# Patient Record
Sex: Male | Born: 1945 | Race: White | Hispanic: No | Marital: Married | Smoking: Former smoker
Health system: Southern US, Community
[De-identification: ages and names within clinical notes are randomized; demographics above are authoritative.]

## PROBLEM LIST (undated history)

## (undated) DIAGNOSIS — R0600 Dyspnea, unspecified: Secondary | ICD-10-CM

## (undated) DIAGNOSIS — G20 Parkinson's disease: Secondary | ICD-10-CM

## (undated) DIAGNOSIS — Z8042 Family history of malignant neoplasm of prostate: Secondary | ICD-10-CM

## (undated) DIAGNOSIS — G4733 Obstructive sleep apnea (adult) (pediatric): Secondary | ICD-10-CM

## (undated) DIAGNOSIS — F419 Anxiety disorder, unspecified: Secondary | ICD-10-CM

## (undated) DIAGNOSIS — K5792 Diverticulitis of intestine, part unspecified, without perforation or abscess without bleeding: Secondary | ICD-10-CM

## (undated) DIAGNOSIS — M5416 Radiculopathy, lumbar region: Secondary | ICD-10-CM

## (undated) DIAGNOSIS — G8929 Other chronic pain: Secondary | ICD-10-CM

## (undated) DIAGNOSIS — E785 Hyperlipidemia, unspecified: Secondary | ICD-10-CM

## (undated) DIAGNOSIS — Z801 Family history of malignant neoplasm of trachea, bronchus and lung: Secondary | ICD-10-CM

## (undated) DIAGNOSIS — Z9889 Other specified postprocedural states: Secondary | ICD-10-CM

## (undated) DIAGNOSIS — D649 Anemia, unspecified: Secondary | ICD-10-CM

## (undated) DIAGNOSIS — R112 Nausea with vomiting, unspecified: Secondary | ICD-10-CM

## (undated) DIAGNOSIS — C61 Malignant neoplasm of prostate: Secondary | ICD-10-CM

## (undated) DIAGNOSIS — M545 Low back pain, unspecified: Secondary | ICD-10-CM

## (undated) DIAGNOSIS — M1990 Unspecified osteoarthritis, unspecified site: Secondary | ICD-10-CM

## (undated) DIAGNOSIS — I10 Essential (primary) hypertension: Secondary | ICD-10-CM

## (undated) DIAGNOSIS — J189 Pneumonia, unspecified organism: Secondary | ICD-10-CM

## (undated) DIAGNOSIS — E669 Obesity, unspecified: Secondary | ICD-10-CM

## (undated) DIAGNOSIS — J069 Acute upper respiratory infection, unspecified: Secondary | ICD-10-CM

## (undated) DIAGNOSIS — Z9989 Dependence on other enabling machines and devices: Secondary | ICD-10-CM

## (undated) DIAGNOSIS — I2510 Atherosclerotic heart disease of native coronary artery without angina pectoris: Secondary | ICD-10-CM

## (undated) DIAGNOSIS — K219 Gastro-esophageal reflux disease without esophagitis: Secondary | ICD-10-CM

## (undated) DIAGNOSIS — I639 Cerebral infarction, unspecified: Secondary | ICD-10-CM

## (undated) DIAGNOSIS — I480 Paroxysmal atrial fibrillation: Secondary | ICD-10-CM

## (undated) DIAGNOSIS — Z803 Family history of malignant neoplasm of breast: Secondary | ICD-10-CM

## (undated) DIAGNOSIS — C3490 Malignant neoplasm of unspecified part of unspecified bronchus or lung: Secondary | ICD-10-CM

## (undated) DIAGNOSIS — I739 Peripheral vascular disease, unspecified: Secondary | ICD-10-CM

## (undated) DIAGNOSIS — I4891 Unspecified atrial fibrillation: Secondary | ICD-10-CM

## (undated) DIAGNOSIS — G20A1 Parkinson's disease without dyskinesia, without mention of fluctuations: Secondary | ICD-10-CM

## (undated) HISTORY — DX: Dyspnea, unspecified: R06.00

## (undated) HISTORY — DX: Gastro-esophageal reflux disease without esophagitis: K21.9

## (undated) HISTORY — DX: Family history of malignant neoplasm of breast: Z80.3

## (undated) HISTORY — PX: BACK SURGERY: SHX140

## (undated) HISTORY — DX: Malignant neoplasm of prostate: C61

## (undated) HISTORY — DX: Hyperlipidemia, unspecified: E78.5

## (undated) HISTORY — PX: ADENOIDECTOMY: SUR15

## (undated) HISTORY — DX: Family history of malignant neoplasm of trachea, bronchus and lung: Z80.1

## (undated) HISTORY — PX: JOINT REPLACEMENT: SHX530

## (undated) HISTORY — DX: Peripheral vascular disease, unspecified: I73.9

## (undated) HISTORY — PX: CORONARY ANGIOPLASTY WITH STENT PLACEMENT: SHX49

## (undated) HISTORY — DX: Family history of malignant neoplasm of prostate: Z80.42

## (undated) HISTORY — PX: TONSILLECTOMY AND ADENOIDECTOMY: SUR1326

## (undated) HISTORY — DX: Radiculopathy, lumbar region: M54.16

## (undated) HISTORY — PX: ANTERIOR CERVICAL DECOMP/DISCECTOMY FUSION: SHX1161

---

## 1996-01-03 DIAGNOSIS — J189 Pneumonia, unspecified organism: Secondary | ICD-10-CM

## 1996-01-03 HISTORY — DX: Pneumonia, unspecified organism: J18.9

## 1998-01-30 ENCOUNTER — Other Ambulatory Visit: Admission: RE | Admit: 1998-01-30 | Discharge: 1998-01-30 | Payer: Self-pay | Admitting: Family Medicine

## 2001-07-13 ENCOUNTER — Inpatient Hospital Stay (HOSPITAL_COMMUNITY): Admission: RE | Admit: 2001-07-13 | Discharge: 2001-07-15 | Payer: Self-pay | Admitting: Specialist

## 2001-07-13 ENCOUNTER — Encounter: Payer: Self-pay | Admitting: Specialist

## 2002-04-30 ENCOUNTER — Encounter: Payer: Self-pay | Admitting: Specialist

## 2002-04-30 ENCOUNTER — Encounter: Admission: RE | Admit: 2002-04-30 | Discharge: 2002-04-30 | Payer: Self-pay | Admitting: Specialist

## 2002-10-01 ENCOUNTER — Encounter: Payer: Self-pay | Admitting: Specialist

## 2002-10-05 ENCOUNTER — Inpatient Hospital Stay (HOSPITAL_COMMUNITY): Admission: RE | Admit: 2002-10-05 | Discharge: 2002-10-07 | Payer: Self-pay | Admitting: Specialist

## 2002-10-05 ENCOUNTER — Encounter: Payer: Self-pay | Admitting: Specialist

## 2003-05-24 ENCOUNTER — Inpatient Hospital Stay (HOSPITAL_COMMUNITY): Admission: EM | Admit: 2003-05-24 | Discharge: 2003-05-28 | Payer: Self-pay | Admitting: Emergency Medicine

## 2003-05-24 ENCOUNTER — Encounter: Payer: Self-pay | Admitting: Emergency Medicine

## 2003-10-05 HISTORY — PX: POSTERIOR LUMBAR FUSION: SHX6036

## 2003-10-11 ENCOUNTER — Inpatient Hospital Stay (HOSPITAL_COMMUNITY): Admission: RE | Admit: 2003-10-11 | Discharge: 2003-10-16 | Payer: Self-pay | Admitting: Specialist

## 2004-02-20 ENCOUNTER — Encounter: Admission: RE | Admit: 2004-02-20 | Discharge: 2004-02-20 | Payer: Self-pay | Admitting: Specialist

## 2004-05-19 ENCOUNTER — Encounter: Admission: RE | Admit: 2004-05-19 | Discharge: 2004-05-19 | Payer: Self-pay | Admitting: Specialist

## 2004-12-22 ENCOUNTER — Encounter: Admission: RE | Admit: 2004-12-22 | Discharge: 2004-12-22 | Payer: Self-pay | Admitting: Specialist

## 2004-12-23 ENCOUNTER — Inpatient Hospital Stay (HOSPITAL_COMMUNITY): Admission: EM | Admit: 2004-12-23 | Discharge: 2004-12-24 | Payer: Self-pay | Admitting: Emergency Medicine

## 2005-02-22 ENCOUNTER — Ambulatory Visit (HOSPITAL_BASED_OUTPATIENT_CLINIC_OR_DEPARTMENT_OTHER): Admission: RE | Admit: 2005-02-22 | Discharge: 2005-02-22 | Payer: Self-pay

## 2005-02-28 ENCOUNTER — Ambulatory Visit: Payer: Self-pay | Admitting: Internal Medicine

## 2005-03-04 HISTORY — PX: LUMBAR LAMINECTOMY/DECOMPRESSION MICRODISCECTOMY: SHX5026

## 2005-03-16 ENCOUNTER — Observation Stay (HOSPITAL_COMMUNITY): Admission: RE | Admit: 2005-03-16 | Discharge: 2005-03-17 | Payer: Self-pay | Admitting: Specialist

## 2005-03-31 ENCOUNTER — Ambulatory Visit: Payer: Self-pay | Admitting: Critical Care Medicine

## 2005-05-13 ENCOUNTER — Ambulatory Visit: Payer: Self-pay | Admitting: Critical Care Medicine

## 2005-09-03 HISTORY — PX: KNEE ARTHROSCOPY W/ PARTIAL MEDIAL MENISCECTOMY: SHX1882

## 2005-09-14 ENCOUNTER — Ambulatory Visit (HOSPITAL_COMMUNITY): Admission: RE | Admit: 2005-09-14 | Discharge: 2005-09-14 | Payer: Self-pay | Admitting: Orthopedic Surgery

## 2005-10-04 HISTORY — PX: KNEE ARTHROSCOPY: SUR90

## 2005-11-26 ENCOUNTER — Encounter: Admission: RE | Admit: 2005-11-26 | Discharge: 2005-11-26 | Payer: Self-pay | Admitting: Specialist

## 2005-12-02 ENCOUNTER — Ambulatory Visit: Payer: Self-pay | Admitting: Critical Care Medicine

## 2005-12-23 ENCOUNTER — Inpatient Hospital Stay (HOSPITAL_COMMUNITY): Admission: EM | Admit: 2005-12-23 | Discharge: 2005-12-25 | Payer: Self-pay | Admitting: Emergency Medicine

## 2006-01-13 ENCOUNTER — Encounter (HOSPITAL_COMMUNITY): Admission: RE | Admit: 2006-01-13 | Discharge: 2006-04-13 | Payer: Self-pay | Admitting: Cardiovascular Disease

## 2006-04-14 ENCOUNTER — Encounter (HOSPITAL_COMMUNITY): Admission: RE | Admit: 2006-04-14 | Discharge: 2006-06-08 | Payer: Self-pay | Admitting: Cardiovascular Disease

## 2006-09-28 ENCOUNTER — Ambulatory Visit: Payer: Self-pay | Admitting: Critical Care Medicine

## 2006-10-04 HISTORY — PX: TOTAL KNEE ARTHROPLASTY: SHX125

## 2006-10-11 ENCOUNTER — Inpatient Hospital Stay (HOSPITAL_COMMUNITY): Admission: RE | Admit: 2006-10-11 | Discharge: 2006-10-15 | Payer: Self-pay | Admitting: Orthopedic Surgery

## 2006-11-14 ENCOUNTER — Ambulatory Visit: Payer: Self-pay | Admitting: Critical Care Medicine

## 2006-12-10 ENCOUNTER — Emergency Department (HOSPITAL_COMMUNITY): Admission: EM | Admit: 2006-12-10 | Discharge: 2006-12-10 | Payer: Self-pay | Admitting: Emergency Medicine

## 2006-12-30 ENCOUNTER — Encounter: Admission: RE | Admit: 2006-12-30 | Discharge: 2006-12-30 | Payer: Self-pay | Admitting: Specialist

## 2007-09-07 ENCOUNTER — Encounter: Admission: RE | Admit: 2007-09-07 | Discharge: 2007-09-07 | Payer: Self-pay | Admitting: Specialist

## 2007-09-11 ENCOUNTER — Encounter: Admission: RE | Admit: 2007-09-11 | Discharge: 2007-09-11 | Payer: Self-pay | Admitting: Specialist

## 2008-05-07 ENCOUNTER — Encounter: Admission: RE | Admit: 2008-05-07 | Discharge: 2008-05-07 | Payer: Self-pay | Admitting: Specialist

## 2009-02-19 ENCOUNTER — Encounter: Admission: RE | Admit: 2009-02-19 | Discharge: 2009-02-19 | Payer: Self-pay | Admitting: Specialist

## 2009-12-21 ENCOUNTER — Encounter: Admission: RE | Admit: 2009-12-21 | Discharge: 2009-12-21 | Payer: Self-pay | Admitting: Specialist

## 2010-07-17 ENCOUNTER — Emergency Department (HOSPITAL_COMMUNITY): Admission: EM | Admit: 2010-07-17 | Discharge: 2010-07-17 | Payer: Self-pay | Admitting: Emergency Medicine

## 2010-08-19 ENCOUNTER — Ambulatory Visit: Payer: Self-pay | Admitting: Cardiovascular Disease

## 2010-08-28 ENCOUNTER — Ambulatory Visit: Payer: Self-pay

## 2010-08-28 ENCOUNTER — Encounter: Payer: Self-pay | Admitting: Cardiovascular Disease

## 2010-08-28 ENCOUNTER — Ambulatory Visit (HOSPITAL_COMMUNITY): Admission: RE | Admit: 2010-08-28 | Discharge: 2010-08-28 | Payer: Self-pay | Admitting: Cardiovascular Disease

## 2010-08-28 ENCOUNTER — Ambulatory Visit: Payer: Self-pay | Admitting: Cardiology

## 2010-09-20 ENCOUNTER — Encounter
Admission: RE | Admit: 2010-09-20 | Discharge: 2010-09-20 | Payer: Self-pay | Source: Home / Self Care | Attending: Specialist | Admitting: Specialist

## 2010-10-25 ENCOUNTER — Encounter: Payer: Self-pay | Admitting: Orthopedic Surgery

## 2011-02-17 ENCOUNTER — Other Ambulatory Visit: Payer: Self-pay | Admitting: *Deleted

## 2011-02-17 ENCOUNTER — Ambulatory Visit: Payer: Self-pay | Admitting: Cardiovascular Disease

## 2011-02-19 NOTE — H&P (Signed)
NAMEKORVER, GRAYBEAL                   ACCOUNT NO.:  [id number]   MEDICAL RECORD NO.:  [id number]         PATIENT TYPE:  LINP   LOCATION:                               FACILITY:  [facility]   PHYSICIAN:  Madlyn Frankel. Charlann Boxer, M.D.  DATE OF BIRTH:  September 08, 1946   DATE OF ADMISSION:  10/11/2006  DATE OF DISCHARGE:                              HISTORY & PHYSICAL   PROCEDURE:  A left total knee replacement.   CHIEF COMPLAINT:  Left knee pain.   HISTORY OF PRESENT ILLNESS:  This 65 year old male with a history of  persistent left knee pain.  This has bothered her for some time, has had  previous scheduled surgery for this left knee but has developed other  medical problem in the interim.  He developed some heart conditions  which he has been treated for successfully.  He is for his left knee.  He has tried all conservative measures including over-the-counter anti-  inflammatories, prescription anti-inflammatories, corticosteroid  injections which have failed to provide significant relief.  Due to the  fact that conservative measures have failed, his quality of life is  significantly diminished because of this pain, he has elected to proceed  forward with a left total knee replacement.   PAST MEDICAL HISTORY:  1. Coronary artery disease with history of MI in 2004 as well as      multiple stent placements and recent restenting of drug-eluting      stent to stainless-steel stent.  2. Hypertension.  3. Sleep apnea with use of CPAP machine.  4. Reflux disease.  5. Osteoarthritis.   PAST SURGICAL HISTORY:  1. Cervical fusion at 2 levels in October2002.  2. January2004, cervical fusion re-do C5, C6.  3. August2000 cardiac catheterization with stent placement.  4. January2005 lumbar fusion L5, S1.  5. January2006 left knee arthroscopic surgery.  6. June 2006, foraminal stenosis at L4-L5, L5-S1 with a decompression.  7. December7,2006 second left knee arthroscopic surgery.  8. Has had injection in his  knees at least 5 times, hip injection,      lower lumbar injections for radiculitis.  9. In 2007, restenting of coronary arteries.   SOCIAL HISTORY:  Married and his wife will be the primary caregiver  after surgery.   DRUG ALLERGIES:  CODEINE, AMPICILLIN.   MEDICATIONS:  1. Plavix 75 mg 1 p.o. daily.  2. Lisinopril 20 mg 1 p.o. daily.  3. Tricor 145 mg 1 p.o. daily.  4. Metoprolol ER 50 mg 1 p.o. daily.  5. Lipitor 40 mg 1 p.o. daily.  6. NitroQuick 0.4 p.r.n. chest pain.  7. Meloxicam 7.5 mg 1 p.o. daily.  8. Robaxin 750 mg 1 p.o. b.i.d.  9. Vicodin 5/500, 1 p.o. q. 4-6 p.r.n. pain.  10.Prilosec 20 mg 1 p.o. daily.  11.Aspirin 325 mg 1 p.o. daily.  12.Takes multivitamin as well.   REVIEW OF SYSTEMS:  In the past 2 weeks, he has had no new signs or  symptoms of any cardiovascular, respiratory, gastrointestinal,  genitourinary, neurologic, or musculoskeletal system complaints.   PHYSICAL EXAM:  VITAL SIGNS:  Temperature is 99, pulse 68, respirations  18, blood pressure 148/84.  GENERAL:  He is awake, alert, and oriented, well-developed, well-  nourished in no acute distress.  CHEST:  Lungs are clear to auscultation bilaterally.  NECK:  No carotid bruits.  HEART:  Regular rate and rhythm without gallops, clicks, rubs, or  murmur.  ABDOMEN:  Soft, nontender, nondistended.  Bowel sounds present all 4  quadrants.  GENITOURINARY:  Deferred.  EXTREMITIES:  His range of motion is approximately -5 to 100, more  medial joint line tenderness.  SKIN:  No cellulitis.  Dorsalis pedis pulse intact, positive.  NEUROLOGIC:  Intact sensibilities distally.   Labs are pending, [facility] presurgical testing.   X-rays, including chest x-ray, are pending.   IMPRESSION:  Left knee osteoarthritis.   PLAN OF ACTION:  Left total knee replaced on January8,2008, by  surgeon Dr. Durene Romans.  Risks and complications were discussed.  All  questions were encouraged, answered, and  reviewed.     ______________________________  Yetta Glassman [address], [place]      Madlyn Frankel. Charlann Boxer, M.D.  Electronically Signed   BLM/MEDQ  D:  09/19/2006  T:  09/19/2006  Job:  [id number]   cc:   Jethro Bastos, M.D.  Fax: [phone]   Vesta Mixer, M.D.  Fax: [phone]

## 2011-02-19 NOTE — Op Note (Signed)
NAME:  Corey [name], Corey [name]                             ACCOUNT NO.:  [id number]   MEDICAL RECORD NO.:  [id number]                   PATIENT TYPE:  INP   LOCATION:  5008                                 FACILITY:  [facility]   PHYSICIAN:  Kerrin Champagne, M.D.                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   PROCEDURE:  1. Central decompressive laminectomy at the L5-S1 level, with removal of the     spinous process and the posterior aspects of the neural arch of L5.  2. Decompression of bilateral L5 and S1 nerve roots.  3. Right-sided T-lift using right iliac crest bone graft, harvested through     separate fascial incision.  4. An 8 mm radiolucent lordotic Leper cage.  5. Posterior lateral fusion at L5-S1 with right iliac crest bone graft, in     addition to Symphony bone graft and local bone graft material.  6. Posterior instrumentation at L5-S1 with Monarch pedicle screws and rods.   SURGEON:  Kerrin Champagne, M.D.   ASSISTANT:  Wende Neighbors, P.A.   ANESTHESIA:  General orotracheal.   ANESTHESIOLOGIST:  Bedelia Person, M.D.   ESTIMATED BLOOD LOSS:  300 cc.   DRAINS:  1. Hemovac x1.  2. Foley catheter to straight drain.   BRIEF CLINICAL HISTORY:  The patient is a 65 year old male with history of  low back pain with radiation to the right leg, increasing neurogenic  claudication and pain with upright stance and ambulation.  This has been  worsening over the last half year.  The patient is brought to the operating  room, after preoperative studies have demonstrated grade 1 spondylolisthesis  and right L5 nerve root entrapment.   INTRAOPERATIVE FINDINGS:  1. 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  2. Foraminal entrapment of the right L5 nerve root.   DESCRIPTION OF PROCEDURE:  After adequate general anesthesia, the patient in  a prone position with chest rolls, and Jackson table used.  Standard  preoperative antibiotics, standard prep with Duraprep solution, draped in  the usual manner.  The incision, approximately 12-14 cm in length, is  extended through the skin and subcutaneous layers down to the lumbodorsal  fascia in the midline -- extending from approximately L2 to S2.  Incision is  carried on both sides of the spinous processes at L3, L4, L5, S1 and S2.  Note that intraoperative radiograph was obtained, with spinal needles at the  expected L5 and L4 levels; these were identified.  The skin was infiltrated  with Marcaine 0.5% 1:200,000 epinephrine prior to the incision.   Two Cobb's were used to elevate the paralumbar muscles off the posterior  aspect of the elements, extending from S2 to L4.  Retractor is placed.  Soft  tissues continued their dissection off the posterior elements, up to about  the L2 level -- in order to allow for exposure of the L5 and S1 pedicle  insertion sites.  Lateral gutters were exposed on the left and right side,  out to the tips of the transverse process at L5 and S1 and the sacral ala  bilaterally.   Leksell rongeur used to remove the spinous process of L5 and to thin the  posterior aspects of the lamina of L5 bilaterally.  Kerrison 3 and 4 mm were  used to resect the lamina bilaterally, as well as ligament of Flavum at the  L4-5 level and L5-S1 level.   On the left side lateral recess decompression carried out on the right L4-5  level.  The L5 nerve root is identified and traced as it exited.  A 1/4-inch  osteotome used to resect the facet on the right side at L5-S1.  Facet  capsule at L4-5 was preserved.   Entry point for the sacral screw on the right side was then  obtained at the  very base of the superior articular process of S1 on the right side.  A ball  used to perform the initial entry into the cortical bone.  A hand-held blunt-  tipped pedicle finder is used to probe the pedicle at the S1; this was  observed on C-arm fluoroscopy to be in good position and alignment.  This  was then tested using a ball-tipped probe to ensure no cortical breakout.  The depth is measured at 35 mm, tapped with a 6.25 tap.  Then a 7.0 x 35 mm  screw placed on the right side at the S1 level, capturing the pedicle at S1  and the sacrum here.  Note that prior to insertion the screw decortication  was carried out over the ala of the lateral aspects of the superior  articular process of S1.  Bone graft, that was obtained through a separate  fascial incision to the right iliac crest, was then placed out over this  area.  Similarly, a screw was then placed at the L5 level on the right side.  A ball used to make an initial entry point at the intersection of the  transverse process with the lateral aspect of the superior articular process  of L5.  Later, a high-speed bur was used to perform small opening in the  lateral aspect of the superior articular process, more medial placement of  the screw.  The screw was then placed following pedicle probing, using blunt-  tipped pedicle probe under C-arm fluoroscopy.  Measuring for depth following  the use of a ball-tipped probe, to ensure that the screw had not penetrated  laterally.  Screw redirected more medially, as there was some suggestion of  lateral penetration of the first attempt.  This then completed, a 45 mm  length screw was chosen.  A tap was then performed using a 5.5 tap and 6.25  x 45 mm screw placed.  Prior to this, transverse process and lateral portion  of the superior articular process of L5 were decorticated using a high-speed bur and bone graft applied out laterally.  Similarly, the same procedure was   performed on the left side, with introduction of pedicle  screws in the S1-L5  level.   After an induction of the screws, then testing was performed using a pedicle  monitor.  Each of the four screws measured greater than 40 milliamps of soft  tissue resistance.  With this completed, then irrigation was performed.   The L5 nerve root on the right side found to be completely free out in its  neuroforamen.  A Derrico retractor was used to retract the L5 nerve root on  the right side, as well as the thecal sac -- exposing the posterior right  side of the disk space of the L5-S1 level.  A 15 blade scalpel was used to incise the disk posteriorly.  A pituitary  used to excise disk material.  A large lamina spreader was able to be  introduced between the spinous process of S1 and the lower end of L4, in  order to provide some distraction of the disk space at the L5-S1 level.  With this, then pituitary was able to be used to debride the disk space of  disk material.  A 1/4-inch osteotome was used to excise the posterior lip of  osteophytes on the right side of the L5-S1 level.  This further allowed  entry into the disk space.  Pituitary rongeurs and curets, as well as ring  curets were then used to debride the disk space of cartilaginous endplate  material, as well as disk material.  Large pituitary was used to debride the  disk space.  Once this was completed, then an 8 mm dilator was introduced.  This appeared to fill the disk space entirely.  It could be inserted almost  35 mm.  An 8 mm cage was chosen.  This cage was filled with autogenous right  iliac crest bone graft.  It was harvested from a separate fascial incision.  The incision superficial to the lumbodorsal fascia, exposing the right iliac  crest through a subperiosteal dissection both medial and lateral to the  crest at its posterior and superior ___________.  Crest harvested using 3/4-  inch osteotome, using cortical cancellous bone  material first, then  harvesting _____________ using gouges.  Bone graft obtained from the central  laminotomy defect was used to obtain a Symphony graft material with single  large log.  Additional Symphony plasma-rich plasma was able to be obtained  at the end of the case for hemostasis purposes.   The cage was then carefully impacted with autogenous cancellous bone graft.  Additional bone graft from the right iliac crest cancellous origin was  placed into the disk space and packed into place using a curet.  The 8 mm  cage was then inserted into place using lordotic cage impacted into place.  It was observed on a C-arm fluoroscopy to be subset beneath the posterior  aspect of the disk space at L5-S1.  The curved Kicker impacter was then  inserted, used to kick the graft and the cage across the midline into a more  transverse position.  Once this was completed then careful inspection of the laminotomy site demonstrated some small bone debris that was removed using  forceps.  Each of the neural foramen for the L5 nerve roots bilaterally and  the S1 nerve roots were probed using the hockey stick neural probe and  probed freely.  Thrombin-soaked Gelfoam was then placed over the right side  of the laminotomy defect and over the nerve roots here.  A 45 mm curved rod  was then inserted into the left  pedicle fasteners at L5 and S1.  Careful  attention paid to placement of the rod; it ends extended beyond the  fasteners, however not in excess.   First the L5 fastener was tightened to the rod, using the anti-torque sleeve  and then torquing the fastener head screw to a 100 foot pounds.  Then, both  of the pedicle screws were placed under compression with the laminar  spreader removed, and the screw head for the fastener at the S1 level then  tightened to 100 foot pounds.   Similarly, this was performed then on the right side.  Using the anti-torque  sleeve at the L5 level on the right, we  torqued the screw head on the  fastener to 100 foot pounds; ensuring that there was adequate rod extending  superior to the fastener to obtain purchase.  Then, under compression, the lower fastener for the screw was tightened to  the rod -- again to 100 foot pounds.   This completed, then additional Symphony bone graft was placed over the  posterolateral elements on both sides.  Residual of the remaining Symphony  bone graft material was replaced into the right iliac crest bone graft  harvest site, following irrigation of this site.  The right iliac crest bone  graft harvest site was then closed with a running #1 stitch of #1 Vicryl;  closing the fascial layers over the iliac crest.  The subcutaneous layers  approximated with interrupted #1 Vicryl sutures.   The midline incision, first hemostased with Symphony platelet-rich plasma.  A medium Hemovac drain placed in the depth of the incision.  The paralumbar  muscles reapproximated in the midline, after inspection of the laminotomy  defect.  This demonstrated again all neural foramen to be wide open.  Gelfoam placed over the neural foramen site and then on over the laminectomy  site centrally.  The paralumbar muscles approximated loosely in the midline  with interrupted #1 Vicryl sutures.  The lumbodorsal fascia reapproximated  with interrupted figure-of-eight simple sutures of #1 Vicryl sutures --  reattaching to the supraspinous ligament where appropriate.  The deep  subcutaneous layer is approximated with  interrupted #1 and 0 Vicryl  sutures.  More superficial layers done with interrupted 2-0 Vicryl sutures.  The skin was closed with a running subcuticular stitch of 4-0 Vicryl.  Tincture of Benzoin and Steri-Strips applied; 4 x 4's ABP pad affixed to the  skin with Hypafix tape.   The patient was then returned to a supine position, reactivated and  extubated.  Returned to the recovery room in satisfactory condition.                                               Kerrin Champagne, M.D.    Myra Rude  D:  10/11/2003  T:  10/12/2003  Job:  [id number]

## 2011-02-19 NOTE — Cardiovascular Report (Signed)
NAME:  Corey [name], Corey [name]                             ACCOUNT NO.:  [id number]   MEDICAL RECORD NO.:  [id number]                   PATIENT TYPE:  INP   LOCATION:  3737                                 FACILITY:  [facility]   PHYSICIAN:  Vesta Mixer, M.D.              DATE OF BIRTH:  [birth year]   DATE OF PROCEDURE:  05/27/2003  DATE OF DISCHARGE:                              CARDIAC CATHETERIZATION   PROCEDURE:  Cardiac catheterization and PTCA and stenting.   INDICATIONS:  Corey [name] is a middle aged gentleman with a history of moderate  coronary artery disease by heart catheterization several years ago.  He was  admitted on May 24, 2003 with severe episode of chest pain.  Had some  mildly positive cardiac enzymes and is now referred for a heart  catheterization.   PROCEDURE:  The right femoral artery was easily cannulated using a modified  Seldinger technique.   HEMODYNAMICS:  The LV pressure is 128/24 with an aortic pressure of 139/79.   ANGIOGRAPHY:  The right coronary artery had a tight 99% subtotal occlusion  at its mid point.  There was moderate diffuse disease on each side of this  stenosis.  There was TIMI grade 2 flow initially.   The left main coronary artery was engaged best using an Amplatz left I.  An  Amplatz II may have given slightly better opacification if guiding catheter  is needed in the future.   The left main was found to have minor luminal irregularities.   The left anterior descending artery had minor irregularities.  There were no  significant stenoses.   The left circumflex artery had minor luminal irregularities.  There was a  moderate sized obtuse marginal artery which had only minor irregularities.   LEFT VENTRICULOGRAM:  The left ventriculogram was performed in a 30 RAO  position.  It reveals mild cardiomegaly.  The ejection fraction was well  preserved.  The ejection fraction was 55-60%.  The inferior wall was very  minimally hypokinetic.   ANGIOPLASTY:  The 6-French sheath was traded out for a 7-French sheath.  A  Judkins right 4 side hole guide was used to cannulate the right coronary  artery (7-French).  The patient was bolused with intravenous heparin.  He  was already on Integrilin.  A short Patriot guidewire was used, but was  unsuccessful in crossing the stenosis.  It resulted in some transient  thrombosis of the vessel.  At this point an [facility] wire was used  and successfully crossed down into the distal vessel.  A 2.5 x 20 mm Quantum  monorail was passed down across the stenosis and was inflated up to 12  atmospheres for 30 seconds.  This resulted in some improvement, but was  still a very hazy and tight stenosis.  At this point a 3.0 x 32 mm Taxus  stent was deployed along the entire  mid right coronary lesion.  It was  deployed at 14 atmospheres for a total of 51 seconds.  Following this, a  3.25 x 15 mm Quantum Maverick was used for post stent dilatation.  It was  inflated distally at 12 atmospheres for 20 seconds in the mid segment and 14  atmospheres for 20 seconds in the proximal segment.  It was then advanced  back down to the distal segment and inflated once more up to 14 atmospheres.  This resulted in a very nice angiographic result.  There was no evidence of  edge dissection and the stent was well opposed.   COMPLICATIONS:  None.   CONCLUSION:  Single vessel coronary artery disease.  Successful PTCA and  stenting of the mid right coronary artery.  The left anterior descending  artery and left circumflex system have minor luminal irregularities.  He is  stable leaving the catheterization laboratory.                                               Vesta Mixer, M.D.    PJN/MEDQ  D:  05/27/2003  T:  05/27/2003  Job:  [id number]

## 2011-02-19 NOTE — Discharge Summary (Signed)
NAME:  Corey [name], Corey [name]                             ACCOUNT NO.:  [id number]   MEDICAL RECORD NO.:  [id number]                   PATIENT TYPE:  INP   LOCATION:  5008                                 FACILITY:  [facility]   PHYSICIAN:  Kerrin Champagne, M.D.                DATE OF BIRTH:  August 08, 1946   DATE OF ADMISSION:  10/11/2003  DATE OF DISCHARGE:  10/16/2003                                 DISCHARGE SUMMARY   ADMISSION DIAGNOSES:  1. 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  2. Coronary artery disease status post stent placement.  Status post     myocardial infarction August of 2004.  3. History of anterior cervical discectomy and fusion C5-6 and C6-7with     subsequent takedown of hardware and repeat fusion of C5-6.  4. Hypertension.  5. Hyperlipidemia.   DISCHARGE DIAGNOSES:  1. 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  2. Coronary artery disease status post stent placement.  Status post     myocardial infarction August of 2004.  3. History of anterior cervical discectomy and fusion C5-6 and C6-7with     subsequent takedown of hardware and repeat fusion of C5-6.  4. Hypertension.  5. Hyperlipidemia.   1. Postoperative anemia not requiring blood transfusion.  2. Postoperative nausea, vomiting, and constipation resolved at discharge.   PROCEDURE:  On October 11, 2003, the patient underwent central decompressive  laminectomy at the L5-S1 level with removal of the spinous process and the  posterior aspects of the neural arch of  L5.  Decompression of bilateral L5  and S1 nerve root.  Right-sided TLIF using right iliac crest bone graft  harvested through separate fascial incision and with use of an 8 mm  radiolucent lordotic cage.  Posterolateral fusion at L5-S1 with right iliac  crest bone graft in addition to local bone graft and symphony bone graft and  posterior instrumentation at L5-S1 with monarch pedicle screws and rods.  This  was performed by Dr. Otelia Sergeant, assisted by Wende Neighbors, P.A.C, under  general anesthesia.   CONSULTATIONS:  None.   HISTORY OF PRESENT ILLNESS:  The patient is a 65 year old white male with  chronic and progressive low back pain with radiation into the right leg and  increasing neurogenic claudication and pain with upright stance and  ambulation.  Preoperative studies have demonstrated a grade I  spondylolisthesis with right L5 nerve root entrapment.  It was felt he would  require surgical intervention for definitive treatment and was admitted for  the procedure as stated above.   HOSPITAL COURSE:  The patient tolerated the procedure under general  anesthesia without complications.  Postoperatively, neurovascular and motor  function was noted to be intact in the lower extremities.  On the first  postoperative day, his Hemovac drain was discontinued and dressing was  checked daily thereafter.  His wound was found  to be healing with only a  scan amount of drainage and no erythema throughout the hospital stay.  At  discharge, his drainage had subsided and the wound was healing well.  The  patient was initially treated with PCA analgesics.  He remained on PCA  medications throughout most of the hospitalization as he did require only  clear liquids and was slow to advance his diet.  He had minimal abdominal  distention, however, he was developing nausea and was without flatus and  bowel movement for the first couple of days.  With the aid of suppository  and enemas as well as stool softeners, he eventually was able to have a  bowel movement.  At that time, his nausea and vomiting resolved.  He was  then switched to p.o. analgesics and eventually utilized hydrocodone for  pain control.  He eventually was placed on a regular diet which he  tolerated.  The patient received physical therapy consult for ambulation and  gait training.  He did require use of an Aspen LSO while out of the bed   ambulating and was allowed to don and doff the brace in the seated position  at bedside.  Prior to discharge, the patient was ambulating 180 feet with a  rolling walker.  The patient received occupational therapy for ADLs and  tolerated this well.   On the patient's fifth postoperative day, he was taking a regular diet,  having flatus and bowel movements adequately.  Neurovascular and motor  function remained intact throughout the hospital stay and he was afebrile  with vital signs stable and ready for discharge to home on the fifth  postoperative day.   PERTINENT LABORATORY DATA:  Admission CBC with wbc 7.6, rbc 4.09, hemoglobin  12.4, hematocrit 36.6.  Hemoglobin and hematocrit were monitored throughout  the hospital stay and on October 14, 2003, his last value was 9.2 and 27.0,  respectively.  Coagulation studies on admission were within normal limits.  Chemistry studies on admission were normal with the exception of glucose  102.  Repeat BMET on October 12, 2003, showed values normal with the  exception of glucose 119 and calcium 7.7.  Urinalysis on admission was  negative for urinary tract infection.   No EKG or chest x-ray is on this chart for dictation at this time.  Documentation by the anesthesia department indicates chest x-ray on October 08, 2003, showed no active disease and EKG on May 27, 2003, showed sinus  bradycardia.   CONDITION ON DISCHARGE:  Stable.   PLAN:  The patient is discharged to home.  He will continue with activity  including ambulation with his brace.  He is to wear his Aspen LSO anytime he  is out of bed.  He does not have to wear this with sleeping purposes.  Dressing changes will be done daily at home and he is given supplies and  instructions to do so.  He will be allowed to shower with removal of his  brace for that time only.   DISCHARGE MEDICATIONS: 1. Vicodin prescription, to use every four to six hours as needed for pain.  2. Robaxin 500 mg  one every eight hours as needed for spasm.  3. He will continue to use over-the-counter stool softeners daily and     laxative or enema of choice.  4. The patient will resume all of his preoperative medications and also use     Tums bilateral for calcium.   FOLLOW UP:  The  patient will follow up with Dr. Otelia Sergeant two weeks from the  date of his surgery and has been given instructions to call to arrange an  appointment.   DIET:  He will continue on a regular diet.   The patient was supplied with durable medical equipment as needed according  to the physical therapist's discretion.  The patient was advised to call if  he has questions or concerns prior to his return office visit.     Wende Neighbors, P.A.                    Kerrin Champagne, M.D.   SMV/MEDQ  D:  12/10/2003  T:  12/11/2003  Job:  [id number]

## 2011-02-19 NOTE — Assessment & Plan Note (Signed)
[facility]                             PULMONARY OFFICE NOTE   Corey [name], Corey [name]                          MRN:          [id number]  DATE:09/28/2006                            DOB:          [birth year]    This is a 65 year old white male with underlying obstructive sleep  apnea, has recurrent coronary artery disease, had a repeat stent  placement in his right coronary artery here recently.  He is needing to  have a left knee replacement.  This is scheduled for the 8 of January  with Dr. Charlann Boxer.  He complains of nasal congestion and is not tolerating  the CPAP despite lowering pressures to 9 cm water pressure earlier this  year.   The patient maintains:  1. Toprol 50 mg daily.  2. Tricor 145 mg daily.  3. Aspirin 81 mg daily.  4. Lisinopril 20 mg daily.  5. Lipitor 40 mg daily.  6. Mobic 7.50 mg daily.  7. Robaxin 2 daily.  8. Plavix 75 mg daily.  9. Prilosec 20 mg daily.   EXAMINATION:  Temperature was 98, blood pressure 140/76, pulse 85.  Saturation was 97% on room air.  CHEST:  Showed diminished breath sounds with no evidence of wheeze or  rhonchi.  CARDIAC:  Showed a regular rate and rhythm without S3.  Normal S1, S2.  ABDOMEN:  Was soft, nontender.  EXTREMITIES:  Showed no edema or clubbing.  SKIN:  Was clear.  NEUROLOGIC:  Was intact.  HEENT:  Showed no jugular venous distention, lymphadenopathy.  Oropharynx clear.  NECK:  Supple.   IMPRESSION:  Is that of obstructive sleep apnea with mild allergic  rhinitis and intolerance to CPAP mask because of nasal obstruction.   PLAN:  Is to switch to a full face mask via [facility]  and as  well begin Nasacort 2 sprays each nostril daily and saline nasal wash  via the [employer] system.  He will use this twice daily.  We will see the  patient back in return followup.     Charlcie Cradle Delford Field, MD, [facility]  Electronically Signed    PEW/MedQ  DD: 09/28/2006  DT: 09/28/2006  Job #: [id number]   cc:   Madlyn Frankel Charlann Boxer, M.D.  Vesta Mixer, M.D.

## 2011-02-19 NOTE — Op Note (Signed)
Corey [name], Corey [name]                   ACCOUNT NO.:  [id number]   MEDICAL RECORD NO.:  [id number]          PATIENT TYPE:  AMB   LOCATION:  DAY                          FACILITY:  [facility]   PHYSICIAN:  Kerrin Champagne, M.D.   DATE OF BIRTH:  10/09/1945   DATE OF PROCEDURE:  03/16/2005  DATE OF DISCHARGE:                                 OPERATIVE REPORT   PREOPERATIVE DIAGNOSIS:  Bilateral foraminal entrapment secondary to  spondylosis changes, bulging disks at the L4-5 level, affecting primarily  the L4 nerve roots.   POSTOPERATIVE DIAGNOSIS:  1.  Bilateral foraminal entrapment secondary to spondylosis changes, bulging      disks at the L4-5 level, affecting primarily the L4 nerve roots.  2.  Left L5 nerve root entrapment secondary to lateral recess stenosis,      affecting the L5 nerve root above the pedicle.   PROCEDURE:  Central laminectomy at the L4-5 level with bilateral L4-5  foraminotomies, bilateral L5 nerve root decompression.   SURGEON:  Kerrin Champagne, M.D.   ASSISTANT:  Wende Neighbors, PA-C.   ANESTHESIA:  General via orotracheal intubation, Dr. Rica Mast.  Local  infiltration was not performed.   SPECIMENS:  None.   ESTIMATED BLOOD LOSS:  100 cc.   COMPLICATIONS:  None.  Patient returned to the PACU in good condition.   HISTORY OF PRESENT ILLNESS:  The patient is a 65 year old male who underwent  previous L5-S1 decompression and fusion with 360 degree technique using TLIF  and posterolateral fusion with Monarch screws and rods.  The patient has  done well up until about six months ago and began experiencing pain with  standing and ambulation.  Radiation primarily at an L4 distribution on both  lower extremities, left side greater than right.  He underwent a knee  arthroscopy for a torn meniscus.  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  Underwent  myelogram, and post myelogram CT demonstrated a solid fusion at L5-S1 level  with the L5 neuroforamen narrowing; however, the nerve roots had not shown  definite compression.  The bilateral L4 nerve root compression secondary to  spondylosis changes and bulging disk at the L4-5 level, affecting both nerve  roots.  Patient is brought to the operating room to undergo bilateral  foraminotomy at the L4 level and decompression of the lateral recesses at  this segment above his previous fusion site.   DESCRIPTION OF PROCEDURE:  After adequate general anesthesia, the patient  was transferred to the [facility] fracture table.  Bilateral chest rolls were  used.  All pressure points were well-padded.  Both lower extremities were  brought into full extension.  The patient had standard prep with DuraPrep  solution.  He underwent preoperative antibiotics with vancomycin, as he has  allergies to penicillin and ampicillin.  Initial spinal needles were placed  following prepping and draping at the expected L4-5 level.  Intraoperative  lateral radiograph demonstrated the needles at  the L5-S1 level with the  upper needle just at the upper level of the patient's rods that would be at  the L5 pedicle level.  The incision was then made from the upper spinal  needle, extending superiorly over about 4 to 4.5 cm through the skin and  ellipsing the old incision scar.  The incision carried sharply down to the  dorsal lumbar fascia.  Bleeders were controlled using electrocautery.  Cerebellar retractor inserted.  The incision was carried down to the spinous  processes on both sides of the expected remnant of the spinous process at  the L4 level and at the L5 level.  Cobbs were used to elevate the paralumbar  muscles of both sides, exposing the posterior aspect of the elements of the  L4 level.  Clamp placed over the L5 spinous process.  Intraoperative lateral  radiograph demonstrating this to  be the L4 spinous process.  Exposure then  obtained over the posterior aspect of the L3-4 spinous process.  A  McCullough retractor is placed.  Leksell rongeur was used to resect the  remaining portion of the superior aspect of the spinous process of L4, then  thinned the central portions of the remaining lamina of the mid and upper  portion of the L4.  A 3 mm Kerrison was then used to resect the central  portions of the lamina using loupe magnification and a head lamp up to the  L3-4 interspace.  The operating room microscope was draped and brought into  the field under direct magnification.  The scope was sterilely brought into  the field.  The laminotomy region was examined.  Bilateral lateral recess  decompression was carried out using the 3-4 mm Kerrisons.  The area of the  pars examined, and at least 4-6 mm of the pars were left in good condition  on both sides to prevent stress reaction after surgery.  Bilateral  foraminotomy was carried out by resection of the medial aspect of the  ligamentum flavum off of the L4-5 facet bilaterally.  This reflected portion  of bone was quite hypertrophic on the left side impression of the thecal  sac, and the left L5 nerve root identified as well as on the right side some  residual flavum appeared to be impressing upon the thecal sac as well, and  this was decompressed.  Both L5 roots were decompressed well below the  pedicle until they were felt to be exiting the neuroforamen without  compression here.  The superior aspect of the superior articular process of  L5 was resected bilaterally in order to decompress the L4-5 neural foramen  on both sides such that the hockey stick neural probe could be passed out  the neural foramen above and below the L4 nerve root, demonstrating patency  of the foramen and no further nerve compression.  Irrigation was performed.  Thrombin-soaked Gelfoam placed in both lateral recesses in both areas until hemostasis  was then obtained.  This Gelfoam was then removed.  Bone wax  applied to the bleeding cancellous bone surfaces, and excess bone wax  removed.  A small portion of Gelfoam was then placed over the posterior  laminotomy region and irrigation performed.  The incision was then closed by  approximating the paralumbar muscles, interrupted loose sutures of #1  Vicryl.  Lumbodorsal fascia was approximated in the midline with interrupted  #1 Vicryl sutures in simple __________ the subcu layers were approximated  with interrupted #1 2-0 Vicryl sutures.  The skin  was closed with a running  subcu stitch with 4-0 Vicryl.  Tincture of Benzoin and Steri-Strips were  applied, 4x4s and ABD pads were fixed to the skin with Hypafix tape.  The  patient then returned to the supine position, reactivated and extubated.  Returned to the recovery room in satisfactory condition.  All instruments  and sponge counts were correct.       JEN/MEDQ  D:  03/16/2005  T:  03/16/2005  Job:  [id number]

## 2011-02-19 NOTE — H&P (Signed)
NAME:  Corey [name], Corey [name]                             ACCOUNT NO.:  [id number]   MEDICAL RECORD NO.:  [id number]                   PATIENT TYPE:  EMS   LOCATION:  MINO                                 FACILITY:  [facility]   PHYSICIAN:  Armanda Magic, M.D.                  DATE OF BIRTH:  01/19/1946   DATE OF ADMISSION:  05/24/2003  DATE OF DISCHARGE:                                HISTORY & PHYSICAL   CHIEF COMPLAINT:  Chest pain.   HISTORY OF PRESENT ILLNESS:  This is a 65 year old male patient of Dr.  Harvie Bridge, with a history of hypertension and hyperlipidemia with an  admission in 1998 for chest pain.  Catheterization at that time showed mild  to moderate coronary disease, and normal LV function.  He was treated  medically at that time, and is now admitted with chest pain.  Yesterday, he  was mowing his yard and developed cramping sensation in the center of his  chest.  The pain resolved with rest.  This morning, he took his a.m. blood  pressure medicine, Avapro, and at approximately 12 noon had chest cramping  which resolved with rest.  The pain then became intermittent throughout the  day.  Currently he complains of a 5/10 chest pain, although he appears to be  resting quite well.  He denies any shortness of breath.  He did have some  nausea during the episode of chest pain and diaphoresis.   PAST MEDICAL HISTORY:  Significant for hypertension, hyperlipidemia,  moderate coronary disease by catheterization in 1998, spinal stenosis, and  diverticulosis.   PAST SURGICAL HISTORY:  Foot surgery and tonsillectomy.   ALLERGIES:  1. AMPICILLIN.  2. MONOPRIL.  3. CODEINE.   FAMILY HISTORY:  CVA, coronary disease, and hypertension.   SOCIAL HISTORY:  Positive for tobacco in the past.  He is married.   CURRENT MEDICATIONS:  1. Avapro 150 mg daily, one-half tablet.  2. Bextra 20 mg daily.  3. Tizanidine 2 mg p.r.n. q.h.s.  4. Aspirin 81 mg daily.   REVIEW OF SYSTEMS:  Other than what is  stated in the HPI is negative.   PHYSICAL EXAMINATION:  VITAL SIGNS:  Blood pressure is 137/72, heart rate  62.  GENERAL:  This is a well-developed, well-nourished, white male in no acute  distress.  HEENT:  Benign.  NECK:  Supple without lymphadenopathy.  No bruits.  LUNGS:  Clear to auscultation throughout.  HEART:  Regular rate and rhythm.  No murmurs, rubs, or gallops.  Normal S1  and S2.  ABDOMEN:  Soft, nontender, nondistended, with active bowel sounds.  No  hepatosplenomegaly.  EXTREMITIES:  No cyanosis, erythema, or edema.  Good distal pulses.   LABORATORY DATA:  White cell count 7.8, hematocrit 14.2, hemoglobin 42,  platelet count 251.  Sodium 140, potassium 4.5, chloride 105, bicarb 28, BUN  18, creatinine 1.3.  Troponin  less than 0.05.  CPK-MB 3, myoglobin 137.  EKG  shows normal sinus rhythm with no ST-T wave changes.   ASSESSMENT:  1. Chest pain with initial enzymes negative, and nonischemic EKG.     Currently, pain is 5/10.  2. History of moderate coronary artery disease.  3. Hypertension.  4. Hyperlipidemia.   PLAN:  Admit to chest pain unit.  Rule out myocardial infarction with serial  enzymes.  IV heparin drip, IV nitroglycerin drip, Lopressor 12.5 mg b.i.d.  Check a TSH and fasting lipid panel.  Further work-up per Dr. Elease Hashimoto.                                                Armanda Magic, M.D.    TT/MEDQ  D:  05/24/2003  T:  05/25/2003  Job:  [id number]   cc:   Vesta Mixer, M.D.  [address]  [place]  [place] [address]  Fax: [phone]

## 2011-02-19 NOTE — H&P (Signed)
[name], [name]                   ACCOUNT NO.:  [id number]   MEDICAL RECORD NO.:  [id number]          PATIENT TYPE:  INP   LOCATION:  3701                         FACILITY:  [facility]   PHYSICIAN:  Vesta Mixer, M.D. DATE OF BIRTH:  12-21-1945   DATE OF ADMISSION:  12/23/2004  DATE OF DISCHARGE:                                HISTORY & PHYSICAL   HISTORY OF PRESENT ILLNESS:  Corey [name] is a middle-aged gentleman with a  history of coronary artery disease.  He is admitted to the hospital with  episodes of chest pain.   Corey [name] has a history of coronary artery disease.  He is status post PTCA  several years ago.  He is status post PTCA and stenting to his right  coronary artery.  He has done fairly well and has not had any episodes of  chest pain.  He was seen in the office a week or so ago and did not have any  episodes of angina.   Over the past day or so, he has had episodes of exertional angina.  The pain  worsened when he was up and around and was relieved with rest.  Today he had  further episodes of chest pain relieved with two sublingual nitroglycerin.  He called EMS and was brought into the emergency room.  In the ER, his pain  was relieved with more nitroglycerin and eventually with heparin drip.   CURRENT MEDICATIONS:  1.  Aspirin 325 mg a day.  2.  Plavix 75 mg a day.  3.  Toprol XL 25 mg a day.  4.  Tricor 145 mg a day.  5.  Diovan 80 mg a day.   ALLERGIES:  CODEINE, AMPICILLIN, and intolerant to MONOPRIL apparently.   PAST MEDICAL HISTORY:  1.  Hypertension.  2.  Hyperlipidemia.  3.  Coronary artery disease, status post PTCA and stenting of his right      coronary artery in August of 2004.  4.  History of spinal stenosis.   SOCIAL HISTORY:  The patient has a history of smoking in the past.  He is  married.   REVIEW OF SYSTEMS:  Reviewed and is essentially negative.   PHYSICAL EXAMINATION:  GENERAL:  He is a middle-age gentleman in no acute  distress.  He  is alert and oriented x3.  His mood and affect are normal.  VITAL SIGNS:  Temperature 97.7, heart rate 68, blood pressure 139/84.  HEENT:  2+ carotids.  He has no bruits, no JVD, and no thyromegaly.  LUNGS:  Clear to auscultation.  HEART:  Regular rate, S1 and S2.  He has no murmurs, gallops, or rubs.  ABDOMEN:  Good bowel sounds and was nontender.  EXTREMITIES:  No cyanosis, clubbing, or edema.  NEUROLOGY:  Nonfocal.   His EKG reveals normal sinus rhythm.  He has no ST or T wave changes.   Corey [name] presents with some episodes of chest pain.  He has no EKG changes and  his initial cardiac markers are negative.  His pain is fairly typical and I  think that he needs a heart catheterization.  We have discussed the risks,  benefits, and options of heart catheterization.  He understands and agrees  to proceed.      PJN/MEDQ  D:  12/23/2004  T:  12/24/2004  Job:  [id number]

## 2011-02-19 NOTE — Discharge Summary (Signed)
   NAME:  Corey [name], Corey [name]                             ACCOUNT NO.:  [id number]   MEDICAL RECORD NO.:  [id number]                   PATIENT TYPE:  INP   LOCATION:  6522                                 FACILITY:  [facility]   PHYSICIAN:  Vesta Mixer, M.D.              DATE OF BIRTH:  19-Aug-1946   DATE OF ADMISSION:  05/24/2003  DATE OF DISCHARGE:  05/28/2003                                 DISCHARGE SUMMARY   DISCHARGE DIAGNOSES:  1. Inferior wall subendocardial myocardial infarction.  2. Coronary artery disease - status post percutaneous transluminal coronary     angioplasty and stenting of the right coronary artery.  3. Hypertension.  4. History of cigarette smoking.   DISCHARGE MEDICATIONS:  1. Aspirin 325 mg daily.  2. Plavix 75 mg daily.  3. Toprol XL 25 mg daily.  4. Avapro 75 mg daily.  5. Nitroglycerin p.r.n.   DISPOSITION:  The patient has been instructed to stop smoking.  He is to  start on a walking program.  He will see Dr. Elease Hashimoto in one to two weeks.   HISTORY:  Corey [name] is a 65 year old gentleman with a history of moderate  coronary artery disease in the past.  He is admitted to the hospital on  May 24, 2003 with symptoms of chest pain and mildly positive cardiac  enzymes.   HOSPITAL COURSE BY PROBLEMS:  PROBLEM 1.  Coronary artery disease.  The  patient had mild elevations of his cardiac enzymes. He was placed on heparin  and Integrelin and stabilized quite nicely.  He had a PTCA and stenting of a  very tight coronary artery stenosis.  The stenosis was subtotally occluded  at its mid point.  We placed a 3.0 x 32 mm Taxus stent post dilated, using a  3.25 mm Quantum monorail balloon.  The patient tolerated the procedure quite  well.  He will be discharged on the above noted medications and disposition.  We will see him in the office and plan on checking his cholesterol levels at  that time.                                                Vesta Mixer,  M.D.    PJN/MEDQ  D:  05/28/2003  T:  05/29/2003  Job:  [id number]

## 2011-02-19 NOTE — H&P (Signed)
[facility]  Patient:    Corey [name], Corey [name] Visit Number: [id number] MRN: [id number]          Service Type: Attending:  Kerrin Champagne, M.D. Dictated by:   Marcie Bal Velna Ochs, P.A.-C.   CC:         Kerrin Champagne, M.D.  Jethro Bastos, M.D. at [facility]   History and Physical  DATE OF BIRTH:  Nov 28, 1945.  SOCIAL SECURITY NUMBER:  [id number]  SCHEDULED ADMISSION DATE:  July 13, 2001.  CHIEF COMPLAINT:  Neck pain, left arm pain, and weakness in grip strength.  HISTORY OF PRESENT ILLNESS:  Corey [name] is a 65 year old, left hand dominant male who presents with a six-month history of neck pain, left arm pain, and weakness in his grip strength. He does not recall any specific injury or trigger for this. This has been progressive in nature. He is noted now to have weakness in that left arm as well as weakness in his grip strength. He has also developed numbness along his left lateral forearm and all the digits of that left hand. He has undergone evaluation and MRI study which demonstrated a significant cervical findings. Because of his lack of improvement with conservative measures, he elected to undergo surgical intervention.  REVIEW OF SYSTEMS:  Denies any diplopia or blurred vision or headaches. No recent fever or chills. No rhinorrhea, sore throat, or earache. No chest pain, shortness of breath, or cough. No abdominal pain, nausea, vomiting, or diarrhea. Reports some mild constipation. No melena or bright red blood per rectum. No hematuria, urinary frequency, or dysuria. Does report numbness as per the HPI.  PAST MEDICAL HISTORY:  Significant for coronary artery disease with a recent stress test in August which was reportedly okay. Also, borderline hypertension for which he received treatment for a period of one year. This was three years ago at this time. He lost a significant amount of weight afterwards and has no longer required  medication as of now. Also, significant for GI reflux. Denies a history of heart attack, stroke, seizure, cancer, diabetes, or lung or kidney disease.  PAST SURGICAL HISTORY:  Plantar fascial release and tonsillectomy.  ALLERGIES:  CODEINE causing nausea and vomiting and AMPICILLIN causing a mild rash.  MEDICATIONS:  Lorcet, Flexeril, Voltaren, and aspirin 81 mg.  SOCIAL HISTORY: He is married and has three children who are alive and well. He works for [employer]. He recently quit smoking.  His cardiologist is Dr. Elease Hashimoto and his primary medical physician is Dr. Dorothe Pea at [facility]  FAMILY HISTORY:  Significant for hypertension in his mother and his father died of a heart attack at age 40. He also has a twin brother who has hypertension.  PHYSICAL EXAMINATION:  VITAL SIGNS:  Pulse 72, respirations 12, blood pressure 138/90.  GENERAL:  Well-developed, well-nourished male in no acute distress.  HEENT:  Normocephalic, atraumatic. Pupils are equal, round, reactive to light. Extraocular movements are grossly intact. Oropharynx is clear without redness or exudate or lesions.  NECK:  Supple with no cervical lymphadenopathy.  CHEST:  Clear to auscultation bilaterally with no wheezes or crackles.  HEART:  Regular rate and rhythm with no murmurs, rubs, or gallops.  ABDOMEN:  Soft, nontender, nondistended with no masses, no hepatosplenomegaly.  BREASTS and GENITOURINARY:  Not examined as not pertinent to present illness.  SKIN:  Intact without rashes or lesions.  EXTREMITIES:  2+ radial pulses. He has weakness to left his forearm pronator, left biceps,  and left finger extensors. DTRs are 1+/4 symmetrically at the biceps, 1+ on the left triceps, and 2+ on the right triceps.  MRI study demonstrated significant foraminal narrowing at left C5-C6 and C6-C7. Also, large central disc herniation at C5-C6.  IMPRESSION: 1. C5-C6 and C6-C7 herniated nucleus pulposus. 2.  Coronary artery disease. 3. Borderline hypertension. 4. Gastrointestinal reflux.  PLAN:  The patient will be admitted to [facility] to undergo an anterior cervical discectomy and fusion C5-C6 and C6-C7 with Spinal Concept plate and screws and allograft by Dr. Otelia Sergeant on July 13, 2001. All questions have been encouraged and answered for the patient. Dictated by:   Marcie Bal Troncale, P.A.-C. Attending:  Kerrin Champagne, M.D. DD:  07/04/01 TD:  07/04/01 Job: [id number]

## 2011-02-19 NOTE — Discharge Summary (Signed)
[name], Corey [name]                   ACCOUNT NO.:  [id number]   MEDICAL RECORD NO.:  [id number]          PATIENT TYPE:  INP   LOCATION:  2033                         FACILITY:  [facility]   PHYSICIAN:  Vesta Mixer, M.D. DATE OF BIRTH:  02/01/1946   DATE OF ADMISSION:  12/23/2005  DATE OF DISCHARGE:  12/25/2005                                 DISCHARGE SUMMARY   DISCHARGE DIAGNOSES:  1.  Coronary artery disease.  2.  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  3.  Hyperlipidemia.  4.  Hypertension   DISCHARGE MEDICATIONS:  1.  Enteric-coated aspirin 325 mg a day.  2.  Plavix 75 mg a day.  3.  Nitroglycerin as needed.  4.  TriCor 145 mg a day.  5.  Toprol XL 50 mg a day.  6.  Lipitor 40 mg daily.  7.  Lisinopril 10 mg a day.  8.  Robaxin as needed.  9.  Mobic as needed.  10. Prilosec as needed.   DISPOSITION:  The patient will see Dr. Elease Hashimoto in 1-2 weeks.   HISTORY:  [name] is a 65 year old gentleman who was admitted to the  hospital by Dr. Ty Hilts for chest pain and ST-segment elevation in the  inferior leads.  Please see dictated H&P for further details.   HOSPITAL COURSE BY PROBLEMS:  Problem #1.  Inferior wall myocardial  infarction.  The patient with heart catheterization was found to have  subtotally occluded in-stent restenosis involving the mid right coronary  artery.  He underwent successful percutaneous intervention of the mid right  coronary stent.  The patient tolerated the procedure  quite well.  He was up ambulating the following day.  He was discharged the  day following that.  The patient did not have any further episodes of chest  pain.  All of his other medical problems remained stable.   He will follow-up with Dr. Elease Hashimoto as noted above.           ______________________________  Vesta Mixer, M.D.     PJN/MEDQ  D:  03/11/2006  T:  03/11/2006  Job:  [id number]   cc:   Vesta Mixer, M.D.  Fax: [phone]

## 2011-02-19 NOTE — Cardiovascular Report (Signed)
NAMEGARALD, RHEW                   ACCOUNT NO.:  [id number]   MEDICAL RECORD NO.:  [id number]          PATIENT TYPE:  INP   LOCATION:  2033                         FACILITY:  [facility]   PHYSICIAN:  Corey [name], M.D.  DATE OF BIRTH:  08-01-1946   DATE OF PROCEDURE:  12/23/2005  DATE OF DISCHARGE:                              CARDIAC CATHETERIZATION   PROCEDURES PERFORMED:  1.  Left heart catheterization.  2.  Coronary angiography.  3.  Left ventriculogram.  4.  PCI/bare-metal stent implantation mid-right coronary.  5.  Intravascular ultrasound.  6.  Intracoronary thrombectomy.   INDICATIONS:  [name] is a 65 year old man with known ASCVD, and S/P  drug-eluting/Taxus stent implantation August of 2004. He underwent coronary  angiography in March 2006 because of anginal symptoms. The stent was widely  patent. He presented approximately 1645 this afternoon at [facility] with chest pain x2-1/2 hours. An acute inferior  myocardial infarction was apparent on ECG. He was brought to catheterization  laboratory at this time to identify the extent of disease and provide for  further therapeutic options.   PROCEDURE NOTE:  The patient is brought to cardiac catheterization  laboratory in the fasting state. The right groin was prepped and draped in  the usual sterile fashion. Local anesthesia was obtained with infiltration  of 1% lidocaine. A 6-French catheter sheath was inserted percutaneously in  the right femoral artery utilizing an anterior approach over a guiding J-  wire. The patient had originally received 5000 units of heparin  intravenously in the emergency room and the initial ACT was 217 seconds. He  did receive an additional 1000 units of heparin. A 6-French #4 left Judkins  catheter was advanced to the ascending aorta where the pressure was  recorded. The left coronary os was then engaged and cineangiography  performed in LAO and RAO projections.  The #4 left Judkins catheter was  exchanged for #4 right guiding catheter short-tip.  This was advanced to the  ascending aorta and the right coronary os engaged. Cineangiography performed  in LAO and RAO projections. It was noted that the artery was patent with  TIMI grade III flow but there was extensive filling defects throughout the  stented segment in the mid-right coronary. The patient received a double  bolus Integrilin in constant infusion. A 0.014 inches Luge intracoronary  guidewire was passed across the lesion in to the stented segment in the mid-  right coronary without difficulty. Intracoronary thrombectomy was then  performed using the export catheter. Two separate syringes of heme were  obtained during multiple pullback and advance this through the thrombotic  portion of the artery. This maneuver did successfully improve the  angiographic appearance with less evidence of filling defects. A 3.5/24 mm  Scimed Liberte stent was then placed across the midportion of the lesion.  The previously placed stent was a 3.0/32 mm Taxus. The Liberte stent was  deployed at peak pressure of 16 atmospheres for approximately 20 seconds.  This balloon was deflated and removed and the intravascular ultrasound  catheter  Atlantis was advanced distal to the stented segment. A single  mechanical pullback was obtained and the images were analyzed. There  appeared to be good stent apposition, however, there was a decrease in  luminal diameter within the mid-portion of the stented segment. Therefore a  3.75/20 mm Scimed Quantum Maverick intracoronary balloon was placed within  the mid-portion of the stented segment inflated to peak pressure of 20  atmospheres. It was deflated, withdrawn slightly and inflated again to 20  atmospheres. Neither inflation were greater than 30 seconds.   The intravascular ultrasound catheter was return of circulation and a single  mechanical pullback obtained.  Cineangiography was repeated in orthogonal  views without the guidewire in place. The guiding catheter was removed. A  pigtail catheter was then advanced to the left ventricle and pressure  recorded. A cine angiogram in the RAO projection obtained with a power  injector, 20 mL were injected at 12 mL per second. Pressure was recorded  during pullback of the catheter across the aortic valve. The pigtail  catheter was then removed. The arterial sheath was sewn into place. The  patient was transported to recovery area in stable condition.   HEMODYNAMIC RESULTS:  Systemic arterial pressure was 114/68 with a mean of  88 mmHg. There was no systolic gradient across the aortic valve. Left  ventricular end-diastolic pressure was 10 mmHg pre ventriculogram.   ANGIOGRAPHY:  The left ventriculogram demonstrated normal chamber size and  normal global systolic function.  It was a suboptimal study due to  ventricular tachycardia. I could not identify any regional wall motion  abnormality inferiorly. A visual estimated ejection fraction is 65%.   There was a right dominant coronary system present. The main left coronary  contained a 10% to 20% mid-vessel tubular stenosis.   The left anterior descending artery and its branches were minimally  diseased; there is some narrowing in the mid-portion about 30-40% in  magnitude. It extends over about 15-20 mm.  No significant diagonal branches  are present. There is a large ramus branch. The ongoing anterior descending  artery is diffusely diseased, transapical and widely patent.   The left circumflex coronary artery amounts to a large ramus intermedius and  a small circumflex itself. There are no significant obstructions within the  ramus or the circumflex itself.   The right coronary artery is a large vessel and contains a 20% narrowing in  the proximal portion before the proximal edge of the stented segment. The stented segment itself is diffusely  thrombotic with multiple filling  defects. A gross estimate of the degree of obstruction would be in the 50-  60% range. The ongoing right coronary is widely patent. It gives rise to a  moderate-sized posterior descending artery and a large posterolateral  segment with several moderate-sized left ventricular branches. There are no  obstruction seen distally.   Following balloon dilatation and stent implantation, there was no residual  stenosis in the right coronary and the flow was TIMI grade III.   INTRAVASCULAR ULTRASOUND:  The distal reference vessel size following  initial stent placement was 3.1 x 3.3 mm with the square area of 7.93 mm2.  The mid-portion of the stented segment following high-pressure balloon  dilatation measured 2.9 x 3.4 mm and had a 7.81 mm2 area.   FINAL IMPRESSION:  1.  Atherosclerotic cardiovascular disease, single vessel.  2.  Status post successful percutaneous coronary intervention/bare-metal      stent implantation mid-right coronary  artery (stent sandwich).  3.  Acute inferior wall myocardial infarction aborted with intravenous      heparin.  4.  Intact left ventricular size and global systolic function. Ejection      fraction 65%.      Corey [name], M.D.  Electronically Signed     JHE/MEDQ  D:  12/23/2005  T:  12/25/2005  Job:  [id number]   cc:   Vesta Mixer, M.D.  Fax: [phone]

## 2011-02-19 NOTE — Cardiovascular Report (Signed)
Corey [name], Corey [name]                   ACCOUNT NO.:  [id number]   MEDICAL RECORD NO.:  [id number]          PATIENT TYPE:  INP   LOCATION:  3701                         FACILITY:  [facility]   PHYSICIAN:  Vesta Mixer, M.D. DATE OF BIRTH:  [birth year]   DATE OF PROCEDURE:  12/24/2004  DATE OF DISCHARGE:                              CARDIAC CATHETERIZATION   PROCEDURE:  Left heart catheterization with coronary angiography.   HISTORY:  Mr. Wesch is a 65 year old gentleman with a history of coronary  artery disease.  He is status post PTCA and stenting of his right coronary  artery two years ago.  He presented to the emergency room yesterday with  episodes of chest pain.  His pain was relieved with heparin and  nitroglycerin.  He is referred for heart catheterization based on these  symptoms.   The right femoral artery was easily cannulated using a modified Seldinger  technique.   HEMODYNAMICS:  LV pressure was 114/5 with an aortic pressure of 118/74.   ANGIOGRAPHY:  A Judkins left 5 catheter was used a cannula was used to  engage the left main.  We used a Judkins right 4 catheter for the right  injections.   Left main:  The left main has minor luminal irregularities, but is otherwise  unremarkable.   The left anterior descending artery has minor luminal irregularities.  There  are several moderate sized diagonal vessels which are essentially normal.  There is a mild to moderate mild amount of calcification in the proximal  aspect of the left anterior descending artery.   The left circumflex artery is a moderate vessel.   The circumflex gives off a large and branching obtuse marginal artery which  bifurcates into two large branches which supply the first and second obtuse  marginal distributions.  These branches are normal.  The distal circumflex  supplies what would be an OM3 distribution or posterolateral branch.   The right coronary artery is large and dominant.  The stent located  in the  mid RCA is widely patent.  There is a nice step-up and step-down on each  side of the stent.  The posterior descending artery is a moderate to small  vessel, but is otherwise unremarkable.   The left ventriculogram was performed in a 30 RAO position.  It reveals  normal left ventricular systolic function.  The left ventricle appears to be  mildly enlarged.  There is no significant mitral regurgitation.   COMPLICATIONS:  None.   CONCLUSIONS:  1.  Minimal coronary irregularities.  2.  A good long-term patency of the right coronary artery stent.  3.  Normal left ventricular systolic function.  The patient will be able to      go home later today.      PJN/MEDQ  D:  12/24/2004  T:  12/24/2004  Job:  [id number]

## 2011-02-19 NOTE — H&P (Signed)
Corey [name], Corey [name]                   ACCOUNT NO.:  [id number]   MEDICAL RECORD NO.:  [id number]           PATIENT TYPE:   LOCATION:                                 FACILITY:   PHYSICIAN:  Madlyn Frankel. Charlann Boxer, M.D.  DATE OF BIRTH:  06-14-1946   DATE OF ADMISSION:  12/28/2005  DATE OF DISCHARGE:                                HISTORY & PHYSICAL   CHIEF COMPLAINT:  Pain in my left knee.   HISTORY OF PRESENT ILLNESS:  This is a 65 year old white male, who has had a  deteriorating condition concerning his left knee now for a considerable  period of time.  About four months ago he underwent left knee arthroscopy  which was the second time this was done, but unfortunately it has not  provided him with any permanent relief in his pain and discomfort.  We have  tried steroids as well as nonsteroidal anti-inflammatories systemically with  no relief.  We have seen on x-ray deterioration of the joint space with near  bone-on-bone deformity.  After much discussion including the risks and  benefits of surgery and the fact that this is a very active gentleman who  highly desires to have improved lifestyle, it was decided to go ahead with  total knee replacement arthroplasty to the left knee.   PAST MEDICAL HISTORY:  1.  History of coronary artery disease. He has a history of MI in August      2004.  He has a stent in one of his heart arteries which was implanted      in August 2004.  2.  Hypertension.  3.  Sleep apnea but is weaning from the CPAP machine.  4.  Rare angina.  5.  Reflux which is treated with Prilosec successfully.   PAST SURGICAL HISTORY:  1.  Cervical fusion at 2 levels in October 2002.  2.  January 2004, cervical fusion redo C5-C6.  3.  August 2004, cardiac catheterization with stent.  4.  January 2005, lumbar fusion at L5-S1.  5.  January 2006, left knee arthroscopy.  6.  [name] 2006, foraminal stenosis at L4-L5, L5-S1.  Did a decompression.  7.  December 2006, second left knee  arthroscopy.  8.  He has had injections in his knees at least 5 times, hip injection,      lower lumbar injections for radiculitis.   CURRENT MEDICATIONS:  1.  Prilosec 20 mg daily.  2.  Meloxicam 15 mg daily.  3.  Metoprolol 25 mg  4.  TriCor 145 mg.  5.  Nitroglycerin 0.4 mg occasionally but none recently.  6.  Aspirin 81 daily (will stop prior to surgery).  7.  Lisinopril 20 mg daily.  8.  Lipitor 40 mg daily.  9.  Robaxin 750 mg p.r.n. muscle spasm.  10. Hydrocodone for pain.   FAMILY HISTORY:  Positive for heart disease in the father and hypertension  in the mother.   SOCIAL HISTORY:  The patient is married.  He is disabled on [employer].  He has no intake of tobacco products and rare  intake of ETOH.  His wife,  Luster Landsberg, will be his care giver after surgery.   Dr. Delford Field and Dr. Elease Hashimoto have cleared this patient for surgery.   REVIEW OF SYSTEMS:  CNS: No seizure, stroke, paralysis, numbness, double  vision. RESPIRATORY: No productive cough, no hemoptysis, no shortness of  breath. CARDIOVASCULAR: No chest pain, no angina, no orthopnea.  GASTROINTESTINAL: No nausea, vomiting, melena, bloody stools.  GENITOURINARY:  No discharge, dysuria, hematuria. MUSCULOSKELETAL: Primarily  as in History of Present Illness with his left knee.   PHYSICAL EXAMINATION:  GENERAL:  Alert, cooperative, and friendly 59-year-  old white male.  VITAL SIGNS:  Blood pressure 148/78, pulse 80, respirations 12.  HEENT:  Normocephalic.  PERRLA.  EOMs intact.  Oropharynx clear.  CHEST: Clear to auscultation. No rhonchi, no rales.  HEART:  Regular rate and rhythm.  No murmurs are heard.  ABDOMEN: Soft, nontender.  Liver and spleen not felt.  GENITALIA/RECTAL: Not done, no pertinent to present illness.  EXTREMITIES: Crepitus and pain with range of motion of the left knee.   ADMISSION DIAGNOSES:  1.  Osteoarthritis of the left knee.  2.  Sleep apnea.  3.  Hypertension.  4.  Coronary artery  disease.  5.  Reflux.   PLAN:  The patient will undergo a left total knee replacement arthroplasty.  Should we have any medical problems, will certainly contact Dr. Delford Field, and  for any cardiac problems, will contact Dr. Elease Hashimoto.      Corey [name].      Madlyn Frankel Charlann Boxer, M.D.  Electronically Signed    DLU/MEDQ  D:  12/20/2005  T:  12/20/2005  Job:  [id number]   cc:   Vesta Mixer, M.D.  Fax: [phone]   Shan Levans, M.D. [facility]  [address]  [place]  [place] [address]

## 2011-02-19 NOTE — Assessment & Plan Note (Signed)
[facility]                             PULMONARY OFFICE NOTE   WAINO, MOUNSEY                          MRN:          [id number]  DATE:11/14/2006                            DOB:          [birth year]    Mr. Rudge returns today in follow up for obstructive sleep apnea.  He is  doing exceptionally well with no active problems.  He is tolerating his  full face CPAP mask at this time maintaining pressures at 9 cm of water  pressure.  Nasacort 2 sprays each nostril daily has also helped him, as  well.  He had his knee replacement surgery, and this went quite well  with no attendant complications.   PHYSICAL EXAMINATION:  VITAL SIGNS:  Temperature was 98, blood pressure  130/80, pulse 71, saturation 98% on room air.  CHEST:  Clear without evidence of wheeze or rhonchi.  CARDIAC:  Regular rate and rhythm without S3.  Normal S1 and S2.  ABDOMEN:  Soft, nontender.  EXTREMITIES:  No edema or clubbing.  SKIN:  Clear.   IMPRESSION:  Obstructive sleep apnea, stable at this time.   PLAN:  The plan is to maintain CPAP therapy as currently dosed, and will  see the patient back in return follow up.     Charlcie Cradle Delford Field, MD, [facility]  Electronically Signed    PEW/MedQ  DD: 11/14/2006  DT: 11/15/2006  Job #: [id number]

## 2011-02-19 NOTE — Procedures (Signed)
NAME:  Corey [name], Corey [name]                   ACCOUNT NO.:  [id number]   MEDICAL RECORD NO.:  [id number]          PATIENT TYPE:  OUT   LOCATION:  SLEEP CENTER                 FACILITY[facility]   PHYSICIAN:  Clinton D. Maple Hudson, M.D. DATE OF BIRTH:  Dec 24, 1945   DATE OF STUDY:  02/22/2005                              NOCTURNAL POLYSOMNOGRAM   REFERRING PHYSICIAN:  Vesta Mixer, M.D.   STUDY DATE:  Feb 22, 2005   INDICATION FOR STUDY:  Hypersomnia with sleep apnea.   EPWORTH SLEEPINESS SCORE:  13/24.   BMI:  32.   WEIGHT:  257 pounds   SLEEP ARCHITECTURE:  Short total sleep time 156 minutes with sleep  efficiency 40%.  Stage I was 28%, stage II 35%, stages III and IV 20%.  REM  18% of total sleep time.  Sleep latency 125 minutes.  REM latency 173  minutes.  Awake after sleep onset 108 minutes.  Arousal index increased 45.  No medication was taken.   RESPIRATORY DATA:  Respiratory disturbance index (RDI, AHI) 42.3 obstructive  events per hour indicating moderately severe obstructive sleep  apnea/hypopnea syndrome.  There were 61 obstructive apneas and 49 hypopneas.  Events were not positional.  REM RDI 28.  Split study protocol for CPAP  titration could not be followed because of late sleep onset prevented enough  time available for titration.   OXYGEN DATA:  Moderate to loud snoring without oxygen desaturation to a  nadir of 88%.  Mean oxygen saturation through the study was 94% on room air.   CARDIAC DATA:  Normal sinus rhythm.   MOVEMENTS/PARASOMNIA:  Occasional leg jerks with arousal.   IMPRESSION/RECOMMENDATION:  1.  Moderately severe obstructive sleep apnea/hypopnea syndrome, RDI 42.3      per hour with moderate snoring and oxygen saturation to 88%.  2.  Consider return for CPAP titration, will evaluate for alternative      therapy as appropriate.  3.  Short total sleep time with delayed sleep onset (1:08 a.m.).      CDY/MEDQ  D:  02/28/2005 14:36:22  T:  02/28/2005  15:05:40  Job:  [id number]

## 2011-02-19 NOTE — Discharge Summary (Signed)
Corey [name], Corey [name]                   ACCOUNT NO.:  [id number]   MEDICAL RECORD NO.:  [id number]          PATIENT TYPE:  INP   LOCATION:  1507                         FACILITY:  [facility]   PHYSICIAN:  Madlyn Frankel. Charlann Boxer, M.D.  DATE OF BIRTH:  30-May-1946   DATE OF ADMISSION:  10/11/2006  DATE OF DISCHARGE:  10/15/2006                               DISCHARGE SUMMARY   ADMITTING DIAGNOSES:  1. Osteoarthritis.  2. Hypertension.  3. Sleep apnea, with the use of continuous positive airway pressure.  4. Esophageal reflux.  5. Coronary artery disease.   DISCHARGE DIAGNOSES:  1. Osteoarthritis  2. Hypertension.  3. Sleep apnea, with the use of continuous positive airway pressure.  4. Esophageal reflux.  5. Coronary artery disease.   CONSULTS:  None.   PROCEDURE:  Left total knee replacement.  Surgeon:  Dr. Durene Romans.  Assistant:  Dwyane Luo.  Femur was a size 5/left.  Tibial tray was a size  5.  Insert was 10 mm.  Patella was a size 41.   LABORATORY:  Preadmission CBC showed hemoglobin 12.7 and hematocrit  37.3, tracked throughout his course of stay, respectively 10.9 and 32.1  then 9.8 and 28.4.  Hemodynamically stable upon discharge.  Preadmission  white cell differential within normal limits.  Preadmission coagulation  within normal limits.  Preadmission chemistry shows sodium 140,  potassium 4.5 and glucose 127, tracked throughout his course of stay and  remained stable with follow-up chemistry showing sodium 136, potassium  5.8 and glucose 110 and follow-up sodium 134, potassium 4.9 and glucose  126.  Upon discharge, sodium 134, potassium 4.6, glucose 115.  Preadmission urinalysis negative.   CHEST X-RAY:  Lungs clear.  No acute cardiopulmonary process.   ELECTROCARDIOGRAM:  Normal sinus rhythm.   HOSPITAL COURSE:  The patient underwent left total knee replacement.  He  tolerated the procedure well and was admitted to the orthopedic floor.  Pain was well controlled throughout his  course of stay.  On post-op day  1, he did have some nausea and vomiting during the evening as he had had  a Duramorph spinal.  He was given Zofran for nausea and vomiting and  started on Vicodin ER as well as given some Xanax to help with anxiety  and then also was given Phenergan for nausea and vomiting control.  On  post-op day 1, the Hemovac was pulled.  Dressing was clean, dry and  intact.  He was neuromuscularly and vascularly intact and had a positive  straight leg raise.  PT/OT was begun.  Weightbearing as tolerated with  the use of a rolling walker.  Initial PT evaluation was done, which  recommended home health care PT.   On post-op day #2, the patient still continued to have some nausea.  He  was afebrile.  Calf still continued to be soft.  We did restart the IV  to maintain fluids.  We worked to decrease his nausea, and the Xanax was  continued to be used for some anxiety.  We changed the dressing.  The  wound was intact with  no ooze and no sign of infection.   On post-op day 3, the patient still continued be nauseous and was not  ready for discharge due to the severe nausea and vomiting.  Again,  Zofran was used for nausea and vomiting control.  Otherwise,  orthopedically, he was doing very well as he continued to be  neuromuscularly and vascularly intact.   On post-op day #4, he was stable and ready to go.  He was afebrile.  He  was to be weightbearing as tolerated.  His condition was improved.   DISCHARGE DISPOSITION:  Discharged home in stable and improved condition  with home health care PT.   DISCHARGE PHYSICAL THERAPY:  Physical therapy goals will be to work on  gait retraining as well as proprioception.  Want to continue to maximize  range of motion, minimize pain, increase strength and encourage  independence in activities of daily living.  He is weightbearing as  tolerated with the use of a rolling walker.  After 2 weeks, he may  proceed to the use a  single-point cane in the contralateral hand.   DISCHARGE WOUND CARE:  He may shower, however, wrap the wound, keep dry  and redress with a dry dressing on a daily basis.   DISCHARGE MEDICATIONS:  1. Nasacort AQ 2 sprays each nostril daily.  2. Lisinopril 20 mg 1 p.o. daily.  3. Metoprolol 50 mg 1 p.o. daily.  4. NitroQuick 0.4 mg p.r.n.  5. Tricor 145 mg 1 p.o. daily.  6. Lipitor 40 mg 1 p.o. daily.  7. Omeprazole 20 mg 1 p.o. daily.  8. Lovenox 30 mg 1 subcu q.12h. x10 additional days.  9. Norco 05/325 mg 1-2 p.o. q.4-6h. p.r.n. pain.  10.Robaxin 500 mg 1-2 p.o. q.6h. p.r.n. muscle spasm.  11.Colace 1 p.o. b.i.d. p.r.n. constipation.  12.Enteric-coated aspirin 325 mg 1 daily p.o. x4 weeks after Lovenox      completed.  13.Iron 325 mg x3 daily x3 weeks.   DISCHARGE FOLLOWUP:  Follow up with Dr. Charlann Boxer at [phone] in  approximately 2 weeks.  He may return to our office if his condition  worsens.  If he develops any acute shortness of breath or severe calf  pain, he should call emergency services immediately.  We look forward to  treating this very pleasant gentleman.     ______________________________  Yetta Glassman. [address], [place]      Madlyn Frankel. Charlann Boxer, M.D.  Electronically Signed    BLM/MEDQ  D:  11/10/2006  T:  11/10/2006  Job:  [id number]

## 2011-02-19 NOTE — Op Note (Signed)
NAMEGAYLAN, Corey [name]                   ACCOUNT NO.:  [id number]   MEDICAL RECORD NO.:  [id number]          PATIENT TYPE:  INP   LOCATION:  X003                         FACILITY:  [facility]   PHYSICIAN:  Madlyn Frankel. Charlann Boxer, M.D.  DATE OF BIRTH:  1946/04/27   DATE OF PROCEDURE:  10/11/2006  DATE OF DISCHARGE:                               OPERATIVE REPORT   PREOPERATIVE DIAGNOSIS:  End-stage left knee osteoarthritis.   POSTOPERATIVE DIAGNOSIS:  End-stage left knee osteoarthritis.   PROCEDURE:  Left total knee replacement.   COMPONENTS USED:  DePuy rotating platform posterior-stabilized knee  system with size 5 femur, 5 tibia, and 10 mm insert and a 41 patellar  button.   SURGEON:  Madlyn Frankel. Charlann Boxer, M.D.   ASSISTANT:  Dwyane Luo, PA-C   ANESTHESIA:  Dermal spinal plus MAC anesthesia.Marland Kitchen   DRAINS:  X1.   COMPLICATIONS:  None.   TOURNIQUET TIME:  70 minutes at 250 mmHg.   BLOOD LOSS:  50 mL.   INDICATIONS FOR PROCEDURE:  Corey [name] is a very pleasant 65 year old  gentleman whom I have been following for awhile for knee osteoarthritis.  He was actually scheduled for this surgery greater than a year ago when  serendipitously he had a workup and then was indicated having some  cardiac pathology.  He underwent correction of this with stent  placement, and he has been on Plavix and aspirin since.  Based on the  concerns for restenosis of those stents, they wanted to wait a full year  before proceeding with knee replacement surgery, and we are at this  point.  We reviewed the operative risks and benefits of the knee  replacement surgery including infection, DVT, the perioperative  comorbidity-type tissues as well as the need for revision and the  postoperative course and expectations.  Consent obtained.   PROCEDURE IN DETAIL:  The patient was brought to the operative theater.  Once adequate anesthesia and preoperative antibiotics, 2 g of Ancef,  were administered, the patient was positioned  supine.  The proximal  thigh tourniquet was placed.  The left lower extremity was then prepped  from the ankle to the tourniquet in the usual fashion following a pre-  scrub.  Midline incision was made followed by a modified medial patellar  arthrotomy where the patella was subluxated, not everted.  The exposure  was obtained in a routine fashion including a proximal medial peel.   Following this exposure, the femoral canal was opened and irrigated to  prevent fat emboli.  I then passed the intramedullary rod and at 5  degrees of valgus resected initially 10 mm of bone.  The patient had  maybe a 5-degree flexion contracture to begin with.   I went ahead and made these cuts and I made the distal cut.   I incised the femur to be a size 5 femur.  The anterior-posterior and  chamfer cuts were then made.  This was based off the posterior condylar  axis, which was perpendicular to Whiteside's line.   With these cut, I went ahead and fixed the trochlear  box cut.   This was based off the center portion of the femur.  The femur was  relatively tall compared to it being narrow and sat nicely in the medial  and lateral planes.   Attention was then directed to the tibia.  The tibial exposure was  obtained including partial meniscectomies and allowed for exposure.  I  went ahead and resected 10 mm of bone based off the lateral aspect of  the tibia.  This was carried out without complication, excessive bone  debrided.   I then sized to the cut surface to be a size 5 and then placed the tray,  pinned it in position around the medial third of the tubercle, and then  dropped an alignment rod down.  The alignment rod indicated that the cut  was parallel to the tibia in the AP and lateral planes.  Given this, I  went ahead and drilled and keel-punched the tibia in this location and  carried out a trial reduction with a size 6 femur and a 10 mm insert,  and it was felt that the knee did not come out  to full extension.  Given  the fact that it was stable in flexion and tightened in extension, I  went ahead and removed the trial components and set up the  intramedullary device to excise 2 more millimeters of bone off the  distal femur.  Once this was done, I went ahead and repeated the chamfer  cuts and the box cut.  Again following this, a trial reduction was  carried again with the 5 femur and 10  insert.  At this point the knee  came out to full extension and was stable from extension into flexion,  indicating a matched ligament and gaps.  With these trial components in  position, the attention was directed to the patella.  The precut  measurement was 27 mm.  I resected down to 15 mm and placed a 41 button.  The post-cut measurement was back to 27 mm.  The patella tracked without  any application of pressure through the trochlea through extension to  flexion.   At this point all trial components were removed.  The knee was irrigated  normal saline solution pulse lavage, the and knee was injected with  0.25% Marcaine with epinephrine and Toradol.   The cement was mixed.  Once cement was ready, the components were  cemented in position, first the tibia and then the femur and a 10 mm  insert placed.  The knee was brought out into extension.  The patella  was cemented and positionalized.  The excessive cement was debrided  throughout this time.  Once it had cured, the trial component was  removed, debrided cement out the posterior aspect the knee.  Once I was  confident there was no more further cement debris inside the knee, the  final 10 mm insert was placed.   The knee was again irrigated with about 300 mL of normal saline solution  at this point.  A medium Hemovac drain was placed deep.  The tourniquet  was let down.  The extensor mechanism was then reapproximated using #1  Vicryl  with the knee in flexion and the remainder of the wound with 2-0 Vicryl and a running 4-0  Monocryl.  The patient's knee was cleaned, dried and  dressed sterilely with Steri-Strips, dressing sponges, and sterile bulky  wrap with an ice pack.  The patient was then brought to the recovery  room in stable condition.      Madlyn Frankel Charlann Boxer, M.D.  Electronically Signed     MDO/MEDQ  D:  10/11/2006  T:  10/11/2006  Job:  [id number]

## 2011-02-19 NOTE — Discharge Summary (Signed)
NAMEJDEN, Corey [name]                   ACCOUNT NO.:  [id number]   MEDICAL RECORD NO.:  [id number]          PATIENT TYPE:  INP   LOCATION:  3701                         FACILITY:  [facility]   PHYSICIAN:  Vesta Mixer, M.D. DATE OF BIRTH:  1945-12-21   DATE OF ADMISSION:  12/23/2004  DATE OF DISCHARGE:  12/24/2004                                 DISCHARGE SUMMARY   DISCHARGE DIAGNOSES:  1.  Noncardiac chest pain.  2.  History of coronary artery disease.  3.  Dyslipidemia.  4.  Hypertension.   DISCHARGE MEDICATIONS:  1.  Tricor 145 mg at night.  2.  Zocor 20 mg a day.  3.  Diovan 80 mg a day.  4.  Plavix 75 mg a day.  5.  Aspirin 81 mg a day.  6.  Toprol XL 25 mg a day.  7.  Nitroglycerin 0.4 mg as needed.  8.  Vicodin as needed.   DISPOSITION:  Patient will return to home later tonight.  He is to see Dr.  Elease Hashimoto in one to two weeks.  He is to see his medical doctor as needed.   HISTORY:  Mr. Szczesniak is a 65 year old gentleman with a history of coronary  artery disease.  Please see dictated H&P for further details.   HOSPITAL COURSE:  CHEST PAIN:  The patient had no EKG changes.  His cardiac  enzymes were negative.  Because of his symptoms he underwent heart  catheterization.  He was found to have only mild to moderate irregularities,  primarily in the LAD and circumflex system.  The right coronary artery stent  was widely patent.  There was no evidence of plaque disruption.   The patient tolerated the catheterization well.  He was discharged home  after six hours bed rest.  The patient will follow up with Dr. Elease Hashimoto as  noted above.  All his of his other medical problems are stable.      PJN/MEDQ  D:  12/24/2004  T:  12/24/2004  Job:  [id number]

## 2011-02-19 NOTE — Op Note (Signed)
[facility]  Patient:    COURVOISIER, HAMBLEN Visit Number: [id number] MRN: [id number]          Service Type: SUR Location: [id number] Attending Physician:  Lubertha South Proc. 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  Left-sided C6 and C7 radiculopathy.  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  PROCEDURE:  Anterior cervical diskectomy and fusion at both C5-6 and C6-7 with excision of posterior longitudinal ligament at both C5-6 and C6-7.  Fusion performed using allograft dense cancellous bone graft at both levels and instrumentation at the two segments using a spine concepts 52 mm six-hole plate with 14 mm screws.  SURGEON:  Kerrin Champagne, M.D.  ASSISTANT:  Philips J. Montez Morita, M.D.  ANESTHESIA:  GOT, Lucille Passy, M.D.  ESTIMATED BLOOD LOSS:  75 cc.  DRAINS:  TLS x 1, Foley to straight drain removed at the end of the case.  BRIEF CLINICAL HISTORY:  This patient is a 65 year old male, who has been experiencing increasing pain into his left upper extremity with associated numbness into the radial three fingers.  Weakness into his biceps, pronator of the left upper extremity as well as finger extension.  He has undergone attempts at conservative management including selective nerve blocks and traction without relief of pain.  MRI study demonstrates significant central canal stenosis at C5-6 with narrowing of the spinal canal to 5 mm, bilateral foraminal stenosis at the C6-7 level.  He also has some mild degenerative changes at multiple other segments of the cervical spine  but none to the degree that is seen at C5-6 or C6-7 that correlate with his left upper extremity complaints.  He is brought to the operating room to undergo anterior diskectomy and fusion at both C5-6 and C6-7 with allograft bone to the sites and plating using spine concept plate and screw system.  INTRAOPERATIVE FINDINGS:  Ossification of the posterior longitudinal ligament causing significant spinal cord compression over its anterior aspect.  This was decompressed, performing excision of the posteroinferior lip of C5 and superior lip of C6 and excising the entire posterior longitudinal ligament that was calcified centrally towards the right side.  Performing foraminotomies at both sides.  DESCRIPTION OF PROCEDURE:  After adequate general anesthesia, the patient in the beach chair position, 5 pounds cervical Holter traction, neck in slight extension.  Standard preoperative antibiotics.  Standard prep of the anterior neck at the left side using DuraPrep solution, draped in the usual manner, iodine-impregnated Vi-Drape.  The incision in line with the skin creases, lines of Langers along the left side approximately 3 inches in length through the skin and subcutaneous layers using a 10 blade scalpel after infiltration with Marcaine 0.5% with 1:200,000 epinephrine.  The platysmal layer then divided using electrocautery in line with the skin incision.  Blunt dissection then used to define the plane between the trachea and esophagus medially and the carotid sheath laterally, and this bluntly dissected the plane through the anterior aspect of the cervical spine.  A small thyroid artery identified and ligated using a suture ligation on the  carotid side and free-tie medially.  The medial border of the longus colli muscle attachment to the prevertebral fascia then cauterized using bipolar electrocautery and teased across the midline using Academic librarian.  Two large anterior spurs over the  expected disk space at C5-6 and C6-7 were identified, the carotid tuberosity identifying the C6 level.  Spinal needles were then inserted at the expected C5-6 and C6-7 levels using needles that had preserve sheaths only allowing 1 cm or so to protrude to allow for insertion of the needle but preventing the needle from being inserted too deeply.  The medial border of the longus colli muscle then carefully freed up, intraoperative radiograph obtained, demonstrating the needle at the C5-6 level and the needle at the next level below expected to be C6-7.  Using hand-held Clowards, then each of needles were then carefully removed, a small portion of the anterior aspect of the disk excised using a 15 blade scalpel and pituitary rongeur.  Again, the medial border of the longus colli muscle freed up using the Key elevator.  Bleeders controlled using bipolar electrocautery. McCullough retractor inserted with the foot of the retractor beneath the medial border of the longus colli muscle to protect the esophagus and soft tissue structures both medial and lateral.  The 15 blade scalpel then used to incise the remaining portions of the fibers of annulus anteriorly at the C6-7 level.  Pituitary rongeur used to excise the disk centrally and anteriorly.  A 3 mm Kerrison used to incise the anteroinferior lip of C6 and the superior lip of C7 anteriorly, further opening the disk space and allowing for exposure of the disk space.  The 16 mm screw posts were then inserted into the vertebral body of C7 and C6 and distraction obtained across the intervertebral disk space.  Curettage was performed over the end plate of the inferior aspect of C6, superior aspect of C7, and this was taken back to the posterior aspect of the disk space.  A high-speed bur was then used to carefully bur the spurs over the posterior aspect of the disk space then a 1 mm Kerrison passed over the posterosuperior lip of C7, excising a bony  bar over the posterior aspect of this disk space from left to right.  Foraminotomies were then performed bilaterally and bony material then resected off the posteroinferior lip of C6,  decompressing the canal posteriorly and excising the posterior longitudinal ligament as well as the posterior annular fibers.  The C7 nerve root identified and found to be freely exiting out the neuroforamen following decompression and foraminotomy bilaterally using 1 and 2 mm Kerrisons.   Irrigation then performed.   Thrombin-soaked Gelfoam applied to the bleeding bony end plates both superiorly, inferiorly.  The disk space then sounded, and an 9 mm graft appeared to give the best fit at this level. The joint distracted, then the graft was reactivated first in normal saline for five minutes and then impacted into place.  Measuring the depth of the intervertebral disk space indicated a depth of nearly 18 mm, suggesting the graft could be subset, and it was subset an additional 1-2 mm beneath the anterior aspect of the disk space.  With this, then screw post was removed at the C7 level.  High-speed bur used to resect anterior osteophytes over the anterior aspects of the disk space of C6-7 as best as possible.  Exposure was then obtained at the C5-6 level by moving the retractor superiorly and placing beneath the border of  the longus colli muscle for the retractor beneath the longus colli muscle, both sides.  The disk space at the C5-6 level then carefully exposed.  Hand-held Clowards used to retract the soft tissue superiorly.  The disk space at 5-6 was nearly completely occluded and completely collapsed.  With this in mind, then the screw post was placed in the vertebral body of C5 parallel to that of C6.  Distraction obtained immediately; however, there was very little distraction possible in this disk space, and spurs were then resected anteriorly using 2 mm and then 3 mm Kerrisons to resect the  anteroinferior lip of C5 and the superior lip of C6. A 15 blade scalpel used to incise and excise annulus anteriorly as best as possible and pituitary rongeur used to further debride anterior annular fibers as best as possible.  However, the disk space was nearly completely collapsed, so that a high-speed bur was necessary to remove the end plates anteriorly and within the mid portion of the vertebral body.  In the central portion of the disk space, then appeared to open slightly, and pituitary rongeurs were used to excise disk here.  Note that the operating room microscope was brought into the field, and this was done during the resection of disk at the C6-7 level previously, and it was kept in the field also for resection of the disk and high-speed bur application at the C5-6 level.  The inferior aspect of C5 and superior aspect of C6 were carefully high-speed drilled using the 3 mm cutting bur and Stryker system.  This was taken back to the posterior aspect of the disk space.  The Cloward depth gauge was used to ascertain the depth of the current drilling and measured almost 15-16 mm at which point it was felt that careful burring was to be continued but with frequent examination of the depth of the burring, and this was done carefully using a high-speed bur.  It was carried down along the left side along the left uncovertebral joint.  At which point the posterior longitudinal ligament and posterior annular fibers were encountered on the left side.  The 1 mm Kerrison could then be introduced and foraminotomy performed over the left C7 nerve root.  The posterior aspect of the end plate of C6 was able to be carefully freed up with one 2 mm Kerrisons. After performing foraminotomy on the left side, decompressing the left C6 nerve root widely, then a 1 mm Kerrison could then be introduced and carried superiorly, and along the superior aspect of the large spur off the posterior aspect of the  disk space at C5-6.  This is a posterior inferior lip spur off of the C5 segment.  By removing it at the base of the spur superiorly, then decompression was able to be carried out without further narrowing of the spinal canal.  This was done by resecting bone superiorly and then removing the bone as it was performed piecemeal from left side to right side from the site where there was less compression to the site of more compression.  With this, then very large spurs off the posterior inferior aspect of C5 were able to be resected and the spinal canal decompressed.  Remaining calcified posterior longitudinal ligament was then carefully lifted off the cord using titanium nerve hook as well as House curette and narrow 3 mm curettes.  And then was resected using 1 and 2 mm Kerrisons.  The posterior lip osteophytes off the posterior superior aspect of  C6 were resected using 1 and 2 mm Kerrisons.  With this, the central portion of the spinal canal was noted to be completely decompressed.  The area of compression of the anterior aspect of the thecal sac was observed to be present, this eventually returned to its normal position alignment.  The right C6 nerve root was then decompressed and a foraminotomy performed over the right C6 nerve root.  High-speed bur then used to carefully smooth the inferior aspect of C5, superior aspect of C6 fixed after grafting in this area.  A 7 mm graft was able to be inserted, 8 mm could not be inserted.  So a 7 mm long graft was reactivated in saline and then impacted into place under distraction.  Note that care was taken to assure that no soft tissue remained within the disk space that could be retropulsed with insertion of the graft at either C5-6 or C6-7.  The graft then inserted and slightly countersunk.  Note that the inner vertebral disk space depth was measured using Cloward depth gauge and noted to be about 20 mm.  High-speed bur was then used to carefully  smooth the anterior lip osteophytes at the C5-6 level back to the normal anterior surface of the vertebral body of C5 and C6.  Using bone wax coated over a piece of string from a cottonoid end and then the expected length of the six hole plate was determined.  This measured about 52 mm plate, 44 mm between the superior and inferior holes.  Careful inspection of the soft tissues demonstrated no abnormalities.  Screw post holes were removed at both C5 and C6 and bone wax  applied to the bleeding screw post holes.  The 5 pound cervical traction was released.  The patients 52 mm plate was then carefully contoured to the lordosis curve of the anterior aspect of the cervical spine.  Soft tissue anterior to the spine was carefully cauterized and debrided to allow for the plate to kneel against bone carefully.  Care was taken to ensure that the esophagus and soft tissues were carefully protected during this course of the procedure.  The plate was then carefully positioned, holding it into place with the screw holes placed centrally at the C6 level and found to be fitting nicely over the C5 and C7 levels.  This was then impacted into place and observed to be in good position in the midline sagittally.  A singe retaining screw was then placed on the left side at C6.  A right-sided C6 screw was then placed next, drilling with a 14 mm screw bit and then placing a wide shank, wide based 14 mm screw.  Next, screws were then placed at the C5 level, both left side and then right side, again drilling with a 14 mm drill and then placing the wide based screws left side and right side.  Next, screws were then placed at the C7 level on the left side and then right side. Intraoperative C-arm fluoroscopy was necessary then to ascertain positional alignment of the plate following the introduction of the screws and plate. The demonstrated screws well within the vertebral bodies of C5, C6, and C7 without evidence  for retropulsion of bone graft material nor did the screws demonstrate any abnormal length problems.  Irrigation was performed.  Careful inspection of the screws indicated they had all engaged the washers at the plate level and appeared to be locked to the plate.  Soft tissue examination demonstrated no abnormality  of the esophagus or trachea.  The left side appeared normal.  After further irrigation, a 7 mm drain was placed in the depth of the incision, exiting out a stab incision beneath the transverse incision level. No further bleeding was noted.  The incision was closed by approximating the platysmal layer with interrupted 3-0 Vicryl sutures, the subcutaneous layers with interrupted 3-0 Vicryl sutures and the skin closed with a running subcuticular stitch of 4-0 Vicryl.  This was buried.  Tincture of Benzoin and Steri-Strips applied, then 4 x 4 affixed to the skin with Hypafix tape.  Note that the 7 mm TLS drain was charged and sewn into place using a 4-0 nylon suture.  A large [place] collar was applied.  The patient was then reactivated and returned to his bed and returned to the recovery room in satisfactory condition.  All instrument and sponge counts were correct. Permanent C-arm images were obtained for documentation purposes.  Prior to returning to the recovery room, the patients Foley catheter was removed. Attending Physician:  Lubertha South DD:  07/14/01 TD:  07/15/01 Job: [id number]

## 2011-02-19 NOTE — Op Note (Signed)
NAME:  Corey [name], Corey [name]                             ACCOUNT NO.:  [id number]   MEDICAL RECORD NO.:  [id number]                   PATIENT TYPE:  INP   LOCATION:  5022                                 FACILITY:  [facility]   PHYSICIAN:  Kerrin Champagne, M.D.                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  The patient is a year and a half following this surgery; initial  improvement, then worsening pain into his neck, radiation to the left arm.   POSTOPERATIVE DIAGNOSIS:  Central canal stenosis associated with apparent  bone graft material into the spinal canal following a collapse of autogenous  bone graft material used for previous anterior diskectomy and fusion at the  C5-6 level.  Solid fusion, C5-6.  A posterior superior lip osteophyte off of  the C6 posterior vertebral body causing central canal stenosis.   PROCEDURES:  1. Removal of hardware, C5 to C7, Spine Concepts plate and six screws.  2. Exploration of fusion site, C5-6 and C6-7.  3. Takedown of C5-6 anterior fusion with partial vertebrectomy involving the     superior end plate of C6 and inferior end plate of C5.  4. Decompression of the patient's cervical spinal canal via an anterior     approach, then re-arthrodesis of C5-6 utilizing right iliac crest bone     graft harvested through a separate incision and internal fixation using a     Synthes variable plate, 22 mm in length, and four locking screws.   SURGEON:  Kerrin Champagne, M.D.   ASSISTANT:  Sharolyn Douglas, M.D.   ANESTHESIA:  GOT, Bedelia Person, M.D.   ESTIMATED BLOOD LOSS:  70 cc.   DRAINS:  TLS drain, left neck.  Foley to straight drain.   BRIEF CLINICAL HISTORY:  The patient is a 65 year old male nearly a year  and  a half following an anterior diskectomy and fusion at both C5-6 and C6-7 for  a very large central disk protrusion at C5-6, mild to moderate at C6-7, with  approximately 5 mm spinal canal experiencing intermittent Lhermitte-type  phenomena.  The patient underwent initial surgery with decompression and  fusion using dense allograft cancellous bone graft material and Spine  Concept plate.  Over the next several months the patient did extremely well,  then began developing increasing neck pain and radiation into the left  shoulder and left arm.  He never lost the anesthesia in the left upper  extremity preoperatively.  The patient had increase in pain over the last  seven months and underwent extensive evaluation, including both myelogram  and MRI study, which demonstrated spinal canal of 10  mm, however persistence  of a central area of compression in the patient's spinal cord associated  with the C5-6 level.  The patient radiographically shows sign of collapse of  the patient's graft at the C5-6 level.  Plates and screws maintained their  fixation.  After a prolonged period of attempts at conservative management,  the patient is brought to the operating room because of complaints of  increasing persistent pain requiring the use of narcotic medications  limiting his function.  It is felt that the patient presently has cervical  stenosis and some residual osteophyte off the superior posterior aspect of  the vertebral body of C6 causing canal stenosis as well as possible material  from cancellous bone graft collapse resulting in some central canal  stenosis.   INTRAOPERATIVE FINDINGS:  The patient was found to have central stenosis,  underwent partial vertebrectomy including the end plates in the inferior  aspect of C5, superior aspect of C6, decompressing the spinal canal at this  segment.  Large osteophyte off the posterior superior lateral aspect of C6  was found to be present as well  as a softer material felt to represent bone  material from the previous cancellous bone graft as well as residual disk  protrusion.   DESCRIPTION OF PROCEDURE:  After adequate general anesthesia, the patient in  the beach chair position, a bump under the right iliac crest, neck in slight  cervical traction five pounds, in slight extension.  A transverse roll at  the level of the shoulders.  Standard prep with Duraprep over the anterior  aspect of the neck and the right iliac crest, standard preoperative  antibiotics, draped in the usual manner.  An iodine Vi-Drape was used.  The  incision in the left neck from previous surgery was marked with a marking  pen prior to the application of Vi-Drape.  The incision along the left neck  following infiltration of Marcaine 0.5%, 1:200,000 epinephrine, through the  skin and subcu layers directly down to the platysmal layer.  This was  incised in line with the skin incision.  Blunt dissection then used to  divide the fibers, the superficial fascial layers in the interval between  the trachea and esophagus medially and the carotid sheath laterally.  Blunt  dissection using the finger then used to obtain the interval and expose the  anterior aspect of the cervical spine, prevertebral fascial layer.  The  incision over the prevertebral fascia was made directly onto the plate over  the medial border of the longus colli muscle and the Key elevator used to  elevate the scar tissue along the anterior aspect of the plate both medial  and laterally, exposing the plate from C5 to C7.  Additionally a Beyer  rongeur was used to debride soft tissue that had grown about the plate  laterally and superiorly and inferiorly.  Care taken to protect the  esophagus during this portion of the procedure.  The screws were then  removed at the C5 level bilaterally, C7 level bilaterally, and C7 level bilaterally.  A Key elevator then introduced, used to elevate this plate  off  the anterior aspect of the cervical spine, and then additional soft tissue  beneath the plate was then debrided using curved curettes as well as  electrocautery and a Beyer rongeur.  Once this was removed, the anterior  aspect of the vertebral body at C5-6 demonstrated solid fusion, as did C6-7.  The longus colli muscle was then elevated medial and  lateral using a Key  elevator and electrocautery.  A Cloward self-retaining retractor was  inserted beneath the medial border of the longus colli muscle on both sides,  protecting the esophagus and trachea medially and the carotid sheath  laterally.  Screw posts were then placed in the vertebral bodies of C5 and  C6 just above and below the screw holes at each segment to allow for  identification of these areas and prevent a greater than necessary  vertebrectomy.  A high-speed bur under loupe magnification and a head lamp  was then used to perform the initial cuts into the vertebral body at the  expected disk space level, having evaluated to determine the areas of  previous anterior osteophytes from the anterior aspect of the disk space at  C5-6 laterally.  Nearly the entire disk space had gone on to fusion.  The  opening over the disk space, removing the end plates, was then made  measuring about 10-11 mm in height.  A high-speed bur used to debride this  area back to the area of the cortical bone in the posterior superior aspect  of the vertebral body of C6 and over the posterior inferior aspect of C5.  Intraoperative radiograph was obtained with a Penfield 4 in the disk space,  demonstrating this still to be approximately 6-9 mm anterior to the  posterior aspect of the disk space.  Further debridement was carried out  using a microscope draped sterilely, brought into the operative field and  using the high-speed bur, then removing cancellous bone back to the cortical  bone over the posterior aspect of the vertebral body of C6 and C5 on  either  side of the disk space.  The spinal canal was then carefully entered over  the lateral aspect of the spinal canal on the right side and a titanium  nerve hook used to identify the entry into the canal.  The patient's thecal  sac remained intact.  Elevation of the bone off the thecal sac performed  using a small titanium nerve hook.  A 1 mm Kerrison then could be introduced  on the right-hand side of the intervertebral disk space, removing bone along  the right side, then transversely below the level of the expected severe  stenosis to the left side.  Next a 2 mm curette was then used to carefully  break back osteophytes that had formed centrally over the posterior aspect  of the superior aspect of C6 and over the inferior aspect of C5, debriding  the spinal canal of these osteophytes and residual bone graft material and  herniated disk material that was observed to be present.  The entire area of bone material was debrided from right side to left side, decompressing the  spinal canal quite nicely.  The neural foramen on the left side was also  debrided of bone material that was evident.  However, it did not appear to  be causing significant foraminal compression.  Once this was complete, the  central portion of the canal was well-decompressed.  Small portions of bone  were removed off the anterior aspect of the thecal sac, this again felt to  represent some cancellous bone material from previous collapse of graft.  Additionally some small amount of disk material was seen in the midline that  was removed.  The vertebrectomy site and central area of decompression was  then sounded for size and a 1 cm sounder appeared to fit nicely.  Right  iliac crest bone  graft was harvested approximately two fingerbreadths  posterior to the anterior superior iliac spine through an incision  approximately two inches in length through the skin and subcutaneous layers,  using a 10 blade scalpel.   Electrocautery then used divide the more  subcutaneous layers to the superficial aspect of the iliac crest.  Cobb then  used to elevate periosteum both medially and laterally, exposing the crest,  and the single right oscillating saw used to divide the crest over a 1 cm  interval.  A half-inch osteotome then used to divide the base of this.  This  was then carefully trimmed to the dimensions of the intervertebral disk  space and the partial vertebrectomy performed.  The graft then held over the  disk space following irrigation of the disk space and inspection  demonstrating no further debris within the canal.  The graft was then  impacted into place and demonstrated an excellent fit.  Redo instrumentation  was performed, internally fixing the C5-6 interval using a variable locking  plate made by Synthes.  In this plate, screw holes were very close to the  screw holes approximated with the previous Spine Concept in terms of their  position and alignment, so the plate was felt to be a good substitute, #1.  #2, it was felt that the plate could be applied and screw holes because of  the variable nature of being placed into the previous screw holes.  A single  4.5 revision screw was used on the bottom right side of the plate at the C6  level.  This was done after carefully freeing up soft tissue to allow the  plate to anneal against the anterior aspect of the cervical spine.  A high-  speed bur was used to remove further anterior lip osteophytes to allow the  plate to anneal closely to bone.  With this then held against bone, the 4.5  screw was screwed into place.  Drill holes were then placed in both the left  side and right side at the C5 level and new screws were placed, each of  these 4.0, 14 mm in length.  A 14 mm length additional screw was used in the  bottom right side at the C6 level.  On the left side of C4, a 4.0 screw was  used.  This following first drilling, then placing the screw  permanently. Once this was completed, all locking screws were placed, four in number.  Irrigation performed.  The plate appeared to be in good position and  alignment.  The esophagus showed no abnormalities.  A single 10 mm drain was  then placed in the depth of the incision, exiting beneath the incision site.  This was sewn in place with a 4-0 nylon suture.  The left neck incision was  then closed by approximating the deep layers of fascia to the platysma with  interrupted 3-0 Vicryl sutures, more superficial layers with interrupted 3-0  and 4-0 Vicryl sutures, and the skin closed with running subcu stitch of 4-0  Vicryl.  Right iliac crest bone graft harvest site coated with bone wax on  bleeding cancellous bone surfaces, the edges trimmed to smooth this.  Gelfoam then applied following irrigation and then the fascial layer  approximated with interrupted #1 Vicryl, some deep subcu layers with  interrupted 0 and then 2-0 Vicryl suture, and the skin closed with running  subcu stitch of 4-0 Vicryl.  Tincture of Benzoin and Steri-Strips applied.  Four by fours applied  to the left neck.  The patient's TLS drain was then  charged.  This was done after applying some tincture of Benzoin and Steri-  Strips following the 4-0 subcu stitch.  The patient was then placed in a  [place] collar, the right iliac crest bone graft harvest site dressed  with 4 x 4's, fixed to the skin with Hypafix tape.  The patient was then  reactivated and returned to the recovery room in satisfactory condition.  All instrument and sponge counts were correct.                                               Kerrin Champagne, M.D.    Myra Rude  D:  10/05/2002  T:  10/06/2002  Job:  [id number]

## 2011-02-19 NOTE — Op Note (Signed)
NAMEALDRIC, WENZLER                   ACCOUNT NO.:  [id number]   MEDICAL RECORD NO.:  [id number]          PATIENT TYPE:  AMB   LOCATION:  DAY                          FACILITY:  [facility]   PHYSICIAN:  Madlyn Frankel. Charlann Boxer, M.D.  DATE OF BIRTH:  January 27, 1946   DATE OF PROCEDURE:  09/14/2005  DATE OF DISCHARGE:                                 OPERATIVE REPORT   PREOPERATIVE DIAGNOSES:  Left knee osteoarthritis in the setting of  persistent tearing of the medial meniscus or retear of the medial meniscus.   POSTOPERATIVE DIAGNOSES/FINDINGS:  1.  Hyperemic hypertrophic synovitis throughout knee, worse over the      anterior medial aspect of the knee.  2.  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  3.  Degenerative complex tearing to the medial meniscus.  4.  Grade 4 chondromalacia to the medial femur and tibia as kissing lesion      over the weightbearing surface more medial than lateral.   PROCEDURE:  1.  Left knee diagnostic and operative arthroscopy with a medial and      patellofemoral chondroplasty and debridement of chondral flaps.  2.  Partial medial meniscectomy.  3.  Synovectomy utilizing biting baskets, 3.5 Kuda shaver and arthrocare      wand for control of bleeding with synovectomy.   SURGEON:  Madlyn Frankel. Charlann Boxer, M.D.   ASSISTANT:  None.   ANESTHESIA:  General.   ESTIMATED BLOOD LOSS:  Not applicable.   COMPLICATIONS:  None. The patient was stable to the recovery room.   INDICATIONS FOR PROCEDURE:  Mr. Wolfinger is a 65 year old gentleman, referred  for evaluation of his left knee following previous knee arthroscopy in  January of this year. He had persistent discomfort with MRI findings of  persistent medial meniscal tear. I discussed with Mr. Bondar and his wife his  current situation, the risks and benefits of further treatment options  available. At this point with the MRI findings of the medial meniscal tear  and mechanical symptoms that were unrelenting to conservative  care, I felt  that further conservative management and arthroscopy was a placed to start.  The risks and benefits were reviewed including infection, DVT, but more  importantly the risk of persistent pain due to arthritis. Consent was  obtained.   DESCRIPTION OF PROCEDURE:  The patient was brought to the operative theater.  Once adequate anesthesia and preoperative antibiotics, 1 gram of Ancef, were  administered, the patient was positioned supinely with the left leg in a leg  holder. The left lower extremity was then prepped and draped in a sterile  fashion. A standard inferomedial, superomedial, inferolateral portals were  utilized. Diagnostic evaluation of the knee revealed the above noted  findings. The inferomedial portal was entered without complications. Probe  examination of the cartilage surfaces as well as meniscus was carried out.  At this point, the 3.5 Kuda shaver was first inserted for debridement of the  chondral flaps to improve visualization as well as to provide initial  debridement to the posterior horn of the meniscus. A large biting basket was  then utilized for further debridement of this posterior horn of the  meniscus. Further debridement and contouring of the remaining meniscus with  a 3.5 Kuda shaver was carried out as well as chondral debridement of the  medial femoral condyle. I limited the debridement on the tibial surface as  there was already grade 4 changes present. Loose fragments off the medial  femoral condyle were removed and this exposed the bone underneath it.   The lateral compartment was entered and noted to have some slight radial  tear into the central portion of the meniscus over the mid body and anterior  horn. This was debrided using the small biting basket and 3.5 Kuda shaver.  Attention was then directed to the patellofemoral compartment where  synovectomy was carried out with the assistance of an Arthrocare wand for  control of any  significant hemorrhaging. The 3.5 Cuda shaver was then  utilized for chondral debridement  identifying the grade 2-3 changes  present.   Following this the knee was flexed and drained and examined two different  times for any loose fragments or cartilage. Once I was satisfied it was free  of any debris, instrumentation was removed. It was reapproximated using a 4-  0 nylon. The knee was then injected with 0.25% Marcaine with epinephrine.  The patient's knee was then wrapped into a sterile bulky Jones dressing. He  was transferred to the recovery room extubated in stable condition.      Madlyn Frankel Charlann Boxer, M.D.  Electronically Signed     MDO/MEDQ  D:  09/14/2005  T:  09/14/2005  Job:  [id number]

## 2011-02-22 ENCOUNTER — Encounter: Payer: Self-pay | Admitting: Cardiovascular Disease

## 2011-02-22 DIAGNOSIS — I2510 Atherosclerotic heart disease of native coronary artery without angina pectoris: Secondary | ICD-10-CM | POA: Insufficient documentation

## 2011-02-22 DIAGNOSIS — I10 Essential (primary) hypertension: Secondary | ICD-10-CM | POA: Insufficient documentation

## 2011-02-22 DIAGNOSIS — K219 Gastro-esophageal reflux disease without esophagitis: Secondary | ICD-10-CM | POA: Insufficient documentation

## 2011-02-22 DIAGNOSIS — R0602 Shortness of breath: Secondary | ICD-10-CM | POA: Insufficient documentation

## 2011-02-22 DIAGNOSIS — E785 Hyperlipidemia, unspecified: Secondary | ICD-10-CM | POA: Insufficient documentation

## 2011-02-22 DIAGNOSIS — R0600 Dyspnea, unspecified: Secondary | ICD-10-CM | POA: Insufficient documentation

## 2011-02-24 ENCOUNTER — Other Ambulatory Visit (INDEPENDENT_AMBULATORY_CARE_PROVIDER_SITE_OTHER): Payer: Medicare Other | Admitting: *Deleted

## 2011-02-24 ENCOUNTER — Encounter: Payer: Self-pay | Admitting: Cardiovascular Disease

## 2011-02-24 ENCOUNTER — Ambulatory Visit (INDEPENDENT_AMBULATORY_CARE_PROVIDER_SITE_OTHER): Payer: Medicare Other | Admitting: Cardiovascular Disease

## 2011-02-24 VITALS — BP 118/68 | HR 64 | Wt 253.0 lb

## 2011-02-24 DIAGNOSIS — I2510 Atherosclerotic heart disease of native coronary artery without angina pectoris: Secondary | ICD-10-CM

## 2011-02-24 LAB — LIPID PANEL
Cholesterol: 108 mg/dL (ref 0–200)
HDL: 30 mg/dL — ABNORMAL LOW (ref >39.00)
LDL Cholesterol: 63 mg/dL (ref 0–99)
Total CHOL/HDL Ratio: 4
Triglycerides: 73 mg/dL (ref 0.0–149.0)
VLDL: 14.6 mg/dL (ref 0.0–40.0)

## 2011-02-24 LAB — HEPATIC FUNCTION PANEL
ALT: 29 U/L (ref 0–53)
AST: 27 U/L (ref 0–37)
Albumin: 4.2 g/dL (ref 3.5–5.2)
Alkaline Phosphatase: 46 U/L (ref 39–117)
Bilirubin, Direct: 0.1 mg/dL (ref 0.0–0.3)
Total Bilirubin: 0.7 mg/dL (ref 0.3–1.2)
Total Protein: 7.1 g/dL (ref 6.0–8.3)

## 2011-02-24 LAB — BASIC METABOLIC PANEL
BUN: 28 mg/dL — ABNORMAL HIGH (ref 6–23)
CO2: 27 mEq/L (ref 19–32)
Calcium: 9.5 mg/dL (ref 8.4–10.5)
Chloride: 106 mEq/L (ref 96–112)
Creatinine, Ser: 1.1 mg/dL (ref 0.4–1.5)
GFR: 68.83 mL/min (ref >60.00)
Glucose, Bld: 102 mg/dL — ABNORMAL HIGH (ref 70–99)
Potassium: 5.2 mEq/L — ABNORMAL HIGH (ref 3.5–5.1)
Sodium: 139 mEq/L (ref 135–145)

## 2011-02-24 NOTE — Assessment & Plan Note (Signed)
Corey [name] is doing very well from a cardiac standpoint. I'm pleased is not having any episodes of chest pain or shortness breath. We'll check a lipid profile, basic metabolic profile, and HFP today. I'll see him again in 6 months with the same labs.

## 2011-02-24 NOTE — Progress Notes (Signed)
Corey [name] Date of Birth  1946/06/30 [facility] / [facility] [address]     [address] [place], [place]  [address] [phone]  Fax  [phone]  History of Present Illness: Doing well . No angina or dyspnea.  Limited by back pain , shoulder and neck pain.    Current Outpatient Prescriptions on File Prior to Visit  Medication Sig Dispense Refill  . Acetaminophen (TYLENOL ARTHRITIS PAIN PO) Take by mouth as needed.        Marland Kitchen aspirin 325 MG tablet Take 325 mg by mouth daily.        Marland Kitchen atorvastatin (LIPITOR) 40 MG tablet Take 40 mg by mouth daily.        . celecoxib (CELEBREX) 200 MG capsule Take 200 mg by mouth as needed.        . clopidogrel (PLAVIX) 75 MG tablet Take 75 mg by mouth daily.        . fenofibrate 160 MG tablet Take 160 mg by mouth daily.        Marland Kitchen lisinopril (PRINIVIL,ZESTRIL) 20 MG tablet Take 20 mg by mouth daily.        . methocarbamol (ROBAXIN) 750 MG tablet Take 750 mg by mouth as needed.        . metoprolol (TOPROL-XL) 50 MG 24 hr tablet Take 50 mg by mouth daily.        Marland Kitchen omeprazole (PRILOSEC OTC) 20 MG tablet Take 20 mg by mouth daily.        . Tamsulosin HCl (FLOMAX) 0.4 MG CAPS Take 0.4 mg by mouth daily.        . nitroGLYCERIN (NITROSTAT) 0.4 MG SL tablet Place 0.4 mg under the tongue every 5 (five) minutes as needed.          Allergies  Allergen Reactions  . Terazosin Hcl     Past Medical History  Diagnosis Date  . Coronary artery disease     STATUS POST PTCA AND STENTING OF HIS RIGHT CORONARY ARTERY  . Hypertension   . Dyslipidemia   . Dyspnea   . Chest pain   . SOB (shortness of breath)   . GERD (gastroesophageal reflux disease)     Past Surgical History  Procedure Date  . Cardiac catheterization 12/23/2005    NORMAL CHAMBER SIZE AND NORMAL GLOBAL SYSTOLIC FUNCTION. EF 65%  . Cardiac catheterization 12/24/2004    NORMAL LEFT VENTRICULAR SYSTOLIC FUNCTION.THE LEFT VENTRICLE APPEARS TO BE MILDLY ENLARGED.  .  Cardiac catheterization 05/27/2003    MILD CARDIOMEGALY. EF 55-60%  . Cervical fusion 07/2001  . Lumbar fusion 10/2003    L5-S1  . Knee arthroscopy 10/2004    LEFT KNEE    History  Smoking status  . Former Smoker  . Quit date: 10/04/2002  Smokeless tobacco  . Not on file    History  Alcohol Use No    Family History  Problem Relation Age of Onset  . Hypertension Mother   . Heart disease Father     Reviw of Systems:  Reviewed in the HPI.  All other systems are negative.  Physical Exam: BP 118/68  Pulse 64  Wt 253 lb (114.76 kg) The patient is alert and oriented x 3.  The mood and affect are normal.  The skin is warm and dry.  Color is normal.  The HEENT exam reveals that the sclera are nonicteric.  The mucous membranes are moist.  The carotids are 2+ without bruits.  There is no thyromegaly.  There is no JVD.  The lungs are clear.  The chest wall is non tender.  The heart exam reveals a regular rate with a normal S1 and S2.  There are no murmurs, gallops, or rubs.  The PMI is not displaced.   Abdominal exam reveals good bowel sounds.  There is no guarding or rebound.  There is no hepatosplenomegaly or tenderness.  There are no masses.  Exam of the legs reveal no clubbing, cyanosis, or edema.  The legs are without rashes.  The distal pulses are intact.  Cranial nerves II - XII are intact.  Motor and sensory functions are intact.  The gait is normal.  ECG: Sinus brady  Assessment / Plan:

## 2011-02-25 ENCOUNTER — Telehealth: Payer: Self-pay | Admitting: *Deleted

## 2011-02-25 NOTE — Telephone Encounter (Signed)
Needed name of pcp'

## 2011-03-05 ENCOUNTER — Telehealth: Payer: Self-pay | Admitting: Cardiovascular Disease

## 2011-03-05 NOTE — Telephone Encounter (Signed)
Dr Na reviewed, eat low k+ foods and they were listed for pt and needs to exercise more.Pt verbalized understanding. Alfonso Ramus RN

## 2011-03-05 NOTE — Telephone Encounter (Signed)
WANTS RESULTS OF LABS FROM LAST WEEK - LEAVE MESSAGE  ON HOME PHONE

## 2011-03-25 ENCOUNTER — Other Ambulatory Visit: Payer: Self-pay | Admitting: *Deleted

## 2011-03-25 MED ORDER — CLOPIDOGREL BISULFATE 75 MG PO TABS: 75.0000 mg | ORAL_TABLET | Freq: Every day | ORAL | Status: DC

## 2011-03-25 NOTE — Telephone Encounter (Signed)
Fax received from pharmacy. Refill completed. Jodette Cheyne Bungert RN  

## 2011-04-21 ENCOUNTER — Encounter: Payer: Self-pay | Admitting: Cardiovascular Disease

## 2011-06-28 ENCOUNTER — Other Ambulatory Visit: Payer: Self-pay | Admitting: Cardiovascular Disease

## 2011-06-28 NOTE — Telephone Encounter (Signed)
Refilled Meds from fax  

## 2011-08-03 ENCOUNTER — Telehealth: Payer: Self-pay | Admitting: Cardiovascular Disease

## 2011-08-03 NOTE — Telephone Encounter (Signed)
Pt said orthopedics faxed a surgery request and they have not hear anything yet. Pt wants to get this surgery scheduled

## 2011-08-03 NOTE — Telephone Encounter (Signed)
Pt called and informed we haven't received any papers. Our fax number given.

## 2011-08-04 ENCOUNTER — Telehealth: Payer: Self-pay | Admitting: *Deleted

## 2011-08-04 ENCOUNTER — Encounter: Payer: Self-pay | Admitting: Cardiovascular Disease

## 2011-08-04 NOTE — Telephone Encounter (Signed)
Will fax out clearance for right shoulder surg.

## 2011-08-05 HISTORY — PX: SHOULDER ARTHROSCOPY: SHX128

## 2011-08-17 ENCOUNTER — Encounter (HOSPITAL_COMMUNITY): Payer: Self-pay | Admitting: Pharmacy Technician

## 2011-08-17 ENCOUNTER — Other Ambulatory Visit (HOSPITAL_COMMUNITY): Payer: Self-pay | Admitting: Orthopedic Surgery

## 2011-08-17 ENCOUNTER — Ambulatory Visit (HOSPITAL_COMMUNITY)
Admission: RE | Admit: 2011-08-17 | Discharge: 2011-08-17 | Disposition: A | Discharge status: Home / Self Care | Payer: Medicare Other | Source: Ambulatory Visit | Attending: Anesthesiology | Admitting: Anesthesiology

## 2011-08-17 ENCOUNTER — Encounter (HOSPITAL_COMMUNITY)
Admission: RE | Admit: 2011-08-17 | Discharge: 2011-08-17 | Disposition: A | Discharge status: Home / Self Care | Payer: Medicare Other | Source: Ambulatory Visit | Attending: Orthopedic Surgery | Admitting: Orthopedic Surgery

## 2011-08-17 ENCOUNTER — Other Ambulatory Visit: Payer: Self-pay

## 2011-08-17 ENCOUNTER — Encounter (HOSPITAL_COMMUNITY): Payer: Self-pay

## 2011-08-17 DIAGNOSIS — Z01812 Encounter for preprocedural laboratory examination: Secondary | ICD-10-CM | POA: Insufficient documentation

## 2011-08-17 DIAGNOSIS — Z01818 Encounter for other preprocedural examination: Secondary | ICD-10-CM | POA: Insufficient documentation

## 2011-08-17 DIAGNOSIS — Z01810 Encounter for preprocedural cardiovascular examination: Secondary | ICD-10-CM | POA: Insufficient documentation

## 2011-08-17 HISTORY — DX: Acute upper respiratory infection, unspecified: J06.9

## 2011-08-17 HISTORY — DX: Pneumonia, unspecified organism: J18.9

## 2011-08-17 HISTORY — DX: Unspecified osteoarthritis, unspecified site: M19.90

## 2011-08-17 HISTORY — DX: Other specified postprocedural states: R11.2

## 2011-08-17 HISTORY — DX: Other specified postprocedural states: Z98.890

## 2011-08-17 LAB — SURGICAL PCR SCREEN
MRSA, PCR: NEGATIVE
Staphylococcus aureus: NEGATIVE

## 2011-08-17 LAB — BASIC METABOLIC PANEL
BUN: 25 mg/dL — ABNORMAL HIGH (ref 6–23)
CO2: 26 mEq/L (ref 19–32)
Calcium: 10.3 mg/dL (ref 8.4–10.5)
Chloride: 104 mEq/L (ref 96–112)
Creatinine, Ser: 1.17 mg/dL (ref 0.50–1.35)
GFR calc Af Amer: 74 mL/min — ABNORMAL LOW (ref >90)
GFR calc non Af Amer: 64 mL/min — ABNORMAL LOW (ref >90)
Glucose, Bld: 100 mg/dL — ABNORMAL HIGH (ref 70–99)
Potassium: 4.9 mEq/L (ref 3.5–5.1)
Sodium: 139 mEq/L (ref 135–145)

## 2011-08-17 LAB — CBC
HCT: 42.3 % (ref 39.0–52.0)
Hemoglobin: 14 g/dL (ref 13.0–17.0)
MCH: 30.1 pg (ref 26.0–34.0)
MCHC: 33.1 g/dL (ref 30.0–36.0)
MCV: 91 fL (ref 78.0–100.0)
Platelets: 224 10*3/uL (ref 150–400)
RBC: 4.65 MIL/uL (ref 4.22–5.81)
RDW: 13.3 % (ref 11.5–15.5)
WBC: 5.6 10*3/uL (ref 4.0–10.5)

## 2011-08-17 LAB — PROTIME-INR
INR: 1.09 (ref 0.00–1.49)
Prothrombin Time: 14.3 seconds (ref 11.6–15.2)

## 2011-08-17 MED ORDER — CHLORHEXIDINE GLUCONATE 4 % EX LIQD: 60.0000 mL | Freq: Once | CUTANEOUS | Status: DC

## 2011-08-17 NOTE — Pre-Procedure Instructions (Signed)
20 Corey [name]  08/17/2011   Your procedure is scheduled on:  Tuesday August 24, 2011  Report to [facility] at 1245 AM.  Call this number if you have problems the morning of surgery: [phone]   Remember:   Do not eat food:After Midnight.  Do not drink clear liquids: 4 Hours before arrival. (8:45am)  Take these medicines the morning of surgery with A SIP OF WATER: metoprolol, nitroglycerin, omeprazole, flomax, clonazepam, flonase   Do not wear jewelry, make-up or nail polish.  Do not wear lotions, powders, or perfumes. You may wear deodorant.  Do not shave 48 hours prior to surgery.  Do not bring valuables to the hospital.  Contacts, dentures or bridgework may not be worn into surgery.  Leave suitcase in the car. After surgery it may be brought to your room.  For patients admitted to the hospital, checkout time is 11:00 AM the day of discharge.   Patients discharged the day of surgery will not be allowed to drive home.  Name and phone number of your driver: Goran Olden [phone]  Special Instructions: Hibiclens shower 1/2 bottle night before surgery and 1/2 bottle morning of surgery   Please read over the following fact sheets that you were given: Pain Booklet, Coughing and Deep Breathing, MRSA Information and Surgical Site Infection Prevention

## 2011-08-17 NOTE — Progress Notes (Signed)
Called Dr. Diamantina Providence office, left voice message with Dr. Diamantina Providence scheduler requesting doctors orders.

## 2011-08-18 ENCOUNTER — Telehealth: Payer: Self-pay | Admitting: Cardiovascular Disease

## 2011-08-18 NOTE — Telephone Encounter (Signed)
Pt calling wanting to speak with nurse regarding when pt can stop taking plavix prior to R shoulder surgery. Please return pt call to discuss further.

## 2011-08-18 NOTE — Telephone Encounter (Signed)
Called pt and he is to have arthroscopy on his right shoulder. We didn't get a request for dosing but he can if need /be off for 7 days and continue asa, pt will call surgeon and ask and will stop plavix if appropriate.

## 2011-08-23 MED ORDER — CLINDAMYCIN PHOSPHATE 600 MG/50ML IV SOLN
600.0000 mg | INTRAVENOUS | Status: AC
  Administered 2011-08-24: 600 mg via INTRAVENOUS
  Filled 2011-08-23: qty 50

## 2011-08-23 NOTE — H&P (Signed)
Dictated - [id number]

## 2011-08-24 ENCOUNTER — Encounter (HOSPITAL_COMMUNITY): Payer: Self-pay | Admitting: Critical Care Medicine

## 2011-08-24 ENCOUNTER — Ambulatory Visit (HOSPITAL_COMMUNITY)
Admission: RE | Admit: 2011-08-24 | Discharge: 2011-08-24 | Disposition: A | Discharge status: Home / Self Care | Payer: Medicare Other | Source: Ambulatory Visit | Attending: Orthopedic Surgery | Admitting: Orthopedic Surgery

## 2011-08-24 ENCOUNTER — Encounter (HOSPITAL_COMMUNITY)
Admission: RE | Disposition: A | Discharge status: Home / Self Care | Payer: Self-pay | Source: Ambulatory Visit | Attending: Orthopedic Surgery

## 2011-08-24 ENCOUNTER — Ambulatory Visit (HOSPITAL_COMMUNITY): Payer: Medicare Other | Admitting: Critical Care Medicine

## 2011-08-24 DIAGNOSIS — M719 Bursopathy, unspecified: Secondary | ICD-10-CM | POA: Insufficient documentation

## 2011-08-24 DIAGNOSIS — S43439A Superior glenoid labrum lesion of unspecified shoulder, initial encounter: Secondary | ICD-10-CM | POA: Insufficient documentation

## 2011-08-24 DIAGNOSIS — M7550 Bursitis of unspecified shoulder: Secondary | ICD-10-CM

## 2011-08-24 DIAGNOSIS — M67919 Unspecified disorder of synovium and tendon, unspecified shoulder: Secondary | ICD-10-CM | POA: Insufficient documentation

## 2011-08-24 DIAGNOSIS — M19019 Primary osteoarthritis, unspecified shoulder: Secondary | ICD-10-CM | POA: Insufficient documentation

## 2011-08-24 DIAGNOSIS — X58XXXA Exposure to other specified factors, initial encounter: Secondary | ICD-10-CM | POA: Insufficient documentation

## 2011-08-24 SURGERY — SHOULDER ARTHROSCOPY WITH SUBACROMIAL DECOMPRESSION, ROTATOR CUFF REPAIR AND BICEP TENDON REPAIR
Anesthesia: General | Site: Shoulder | Laterality: Right

## 2011-08-24 MED ORDER — DEXAMETHASONE SODIUM PHOSPHATE 4 MG/ML IJ SOLN
INTRAMUSCULAR | Status: DC | PRN
  Administered 2011-08-24: 4 mg via INTRAVENOUS

## 2011-08-24 MED ORDER — LIDOCAINE HCL (CARDIAC) 20 MG/ML IV SOLN
INTRAVENOUS | Status: DC | PRN
  Administered 2011-08-24: 20 mg via INTRAVENOUS
  Administered 2011-08-24: 100 mg via INTRAVENOUS

## 2011-08-24 MED ORDER — MEPERIDINE HCL 25 MG/ML IJ SOLN
6.2500 mg | INTRAMUSCULAR | Status: DC | PRN
Start: 2011-08-24 — End: 2011-08-24

## 2011-08-24 MED ORDER — SODIUM CHLORIDE 0.9 % IR SOLN
Status: DC | PRN
  Administered 2011-08-24: 6000 mL

## 2011-08-24 MED ORDER — FENTANYL CITRATE 0.05 MG/ML IJ SOLN
INTRAMUSCULAR | Status: AC
  Filled 2011-08-24: qty 2

## 2011-08-24 MED ORDER — BUPIVACAINE-EPINEPHRINE PF 0.5-1:200000 % IJ SOLN
INTRAMUSCULAR | Status: DC | PRN
  Administered 2011-08-24: 30 mL

## 2011-08-24 MED ORDER — FENTANYL CITRATE 0.05 MG/ML IJ SOLN
50.0000 ug | INTRAMUSCULAR | Status: DC | PRN
  Administered 2011-08-24: 100 ug via INTRAVENOUS

## 2011-08-24 MED ORDER — HYDROCODONE-ACETAMINOPHEN 10-325 MG PO TABS: 1.0000 | ORAL_TABLET | Freq: Four times a day (QID) | ORAL | Status: AC | PRN

## 2011-08-24 MED ORDER — DROPERIDOL 2.5 MG/ML IJ SOLN
INTRAMUSCULAR | Status: DC | PRN
  Administered 2011-08-24: .6 mg via INTRAVENOUS

## 2011-08-24 MED ORDER — EPHEDRINE SULFATE 50 MG/ML IJ SOLN
INTRAMUSCULAR | Status: DC | PRN
  Administered 2011-08-24 (×2): 15 mg via INTRAVENOUS
  Administered 2011-08-24: 10 mg via INTRAVENOUS

## 2011-08-24 MED ORDER — FENTANYL CITRATE 0.05 MG/ML IJ SOLN
INTRAMUSCULAR | Status: DC | PRN
  Administered 2011-08-24: 75 ug via INTRAVENOUS
  Administered 2011-08-24: 100 ug via INTRAVENOUS
  Administered 2011-08-24: 50 ug via INTRAVENOUS
  Administered 2011-08-24: 75 ug via INTRAVENOUS

## 2011-08-24 MED ORDER — LACTATED RINGERS IV SOLN
INTRAVENOUS | Status: DC
  Administered 2011-08-24 (×4): via INTRAVENOUS

## 2011-08-24 MED ORDER — MIDAZOLAM HCL 5 MG/5ML IJ SOLN
INTRAMUSCULAR | Status: DC | PRN
  Administered 2011-08-24: 2 mg via INTRAVENOUS
  Administered 2011-08-24: 1 mg via INTRAVENOUS

## 2011-08-24 MED ORDER — ROCURONIUM BROMIDE 100 MG/10ML IV SOLN
INTRAVENOUS | Status: DC | PRN
  Administered 2011-08-24: 50 mg via INTRAVENOUS

## 2011-08-24 MED ORDER — CHLORHEXIDINE GLUCONATE 4 % EX LIQD: 60.0000 mL | Freq: Once | CUTANEOUS | Status: DC

## 2011-08-24 MED ORDER — OXYCODONE-ACETAMINOPHEN 10-325 MG PO TABS: 1.0000 | ORAL_TABLET | ORAL | Status: AC | PRN

## 2011-08-24 MED ORDER — PROPOFOL 10 MG/ML IV EMUL
INTRAVENOUS | Status: DC | PRN
  Administered 2011-08-24: 120 mg via INTRAVENOUS
  Administered 2011-08-24 (×2): 10 mg via INTRAVENOUS
  Administered 2011-08-24 (×2): 20 mg via INTRAVENOUS

## 2011-08-24 MED ORDER — EPINEPHRINE HCL 1 MG/ML IJ SOLN
INTRAMUSCULAR | Status: DC | PRN
  Administered 2011-08-24: .1 mg

## 2011-08-24 MED ORDER — PHENYLEPHRINE HCL 10 MG/ML IJ SOLN
10.0000 mg | INTRAMUSCULAR | Status: DC | PRN
  Administered 2011-08-24: 60 ug/min via INTRAVENOUS

## 2011-08-24 MED ORDER — MIDAZOLAM HCL 2 MG/2ML IJ SOLN
INTRAMUSCULAR | Status: AC
  Filled 2011-08-24: qty 2

## 2011-08-24 MED ORDER — MIDAZOLAM HCL 2 MG/2ML IJ SOLN
1.0000 mg | INTRAMUSCULAR | Status: DC | PRN
  Administered 2011-08-24: 2 mg via INTRAVENOUS

## 2011-08-24 MED ORDER — ONDANSETRON HCL 4 MG/2ML IJ SOLN
INTRAMUSCULAR | Status: DC | PRN
  Administered 2011-08-24: 4 mg via INTRAVENOUS

## 2011-08-24 MED ORDER — SODIUM CHLORIDE 0.9 % IJ SOLN
INTRAMUSCULAR | Status: DC | PRN
  Administered 2011-08-24: 50 mL via INTRAVENOUS

## 2011-08-24 MED ORDER — ONDANSETRON HCL 4 MG/2ML IJ SOLN: 4.0000 mg | Freq: Once | INTRAMUSCULAR | Status: DC | PRN

## 2011-08-24 MED ORDER — HYDROMORPHONE HCL PF 1 MG/ML IJ SOLN: 0.2500 mg | INTRAMUSCULAR | Status: DC | PRN

## 2011-08-24 SURGICAL SUPPLY — 69 items
BENZOIN TINCTURE PRP APPL 2/3 (GAUZE/BANDAGES/DRESSINGS) ×2 IMPLANT
BIT DRILL TAK (DRILL) IMPLANT
BLADE CUDA 5.5 (BLADE) IMPLANT
BLADE CUTTER GATOR 3.5 (BLADE) ×2 IMPLANT
BLADE GREAT WHITE 4.2 (BLADE) ×2 IMPLANT
BLADE SURG 11 STRL SS (BLADE) ×2 IMPLANT
BUR GATOR 2.9 (BURR) IMPLANT
BUR OVAL 6.0 (BURR) ×2 IMPLANT
CANNULA SHOULDER 7CM (CANNULA) IMPLANT
CARTRIDGE CURVETEK MED (MISCELLANEOUS) IMPLANT
CARTRIDGE CURVETEK XLRG (MISCELLANEOUS) IMPLANT
CLOTH BEACON ORANGE TIMEOUT ST (SAFETY) ×2 IMPLANT
COVER SURGICAL LIGHT HANDLE (MISCELLANEOUS) ×2 IMPLANT
DRAPE INCISE IOBAN 66X45 STRL (DRAPES) ×4 IMPLANT
DRAPE STERI 35X30 U-POUCH (DRAPES) ×2 IMPLANT
DRAPE U-SHAPE 47X51 STRL (DRAPES) ×4 IMPLANT
DRILL TAK (DRILL)
DRSG PAD ABDOMINAL 8X10 ST (GAUZE/BANDAGES/DRESSINGS) ×2 IMPLANT
DURAPREP 26ML APPLICATOR (WOUND CARE) ×2 IMPLANT
ELECT MENISCUS 165MM 90D (ELECTRODE) IMPLANT
ELECT REM PT RETURN 9FT ADLT (ELECTROSURGICAL) ×2
ELECTRODE REM PT RTRN 9FT ADLT (ELECTROSURGICAL) ×1 IMPLANT
FILTER STRAW FLUID ASPIR (MISCELLANEOUS) ×2 IMPLANT
GAUZE XEROFORM 1X8 LF (GAUZE/BANDAGES/DRESSINGS) ×2 IMPLANT
GLOVE BIO SURGEON ST LM GN SZ9 (GLOVE) ×2 IMPLANT
GLOVE BIOGEL PI IND STRL 8 (GLOVE) ×1 IMPLANT
GLOVE BIOGEL PI INDICATOR 8 (GLOVE) ×1
GLOVE SURG ORTHO 8.0 STRL STRW (GLOVE) ×2 IMPLANT
GOWN PREVENTION PLUS LG XLONG (DISPOSABLE) IMPLANT
GOWN STRL NON-REIN LRG LVL3 (GOWN DISPOSABLE) ×6 IMPLANT
KIT BASIN OR (CUSTOM PROCEDURE TRAY) ×2 IMPLANT
KIT ROOM TURNOVER OR (KITS) ×2 IMPLANT
MANIFOLD NEPTUNE II (INSTRUMENTS) ×2 IMPLANT
NDL SUT 6 .5 CRC .975X.05 MAYO (NEEDLE) ×1 IMPLANT
NEEDLE HYPO 25X1 1.5 SAFETY (NEEDLE) ×2 IMPLANT
NEEDLE MAYO TAPER (NEEDLE) ×1
NEEDLE SPNL 18GX3.5 QUINCKE PK (NEEDLE) ×2 IMPLANT
NS IRRIG 1000ML POUR BTL (IV SOLUTION) ×2 IMPLANT
PACK SHOULDER (CUSTOM PROCEDURE TRAY) ×2 IMPLANT
PAD ARMBOARD 7.5X6 YLW CONV (MISCELLANEOUS) ×2 IMPLANT
SET ARTHROSCOPY TUBING (MISCELLANEOUS) ×1
SET ARTHROSCOPY TUBING LN (MISCELLANEOUS) ×1 IMPLANT
SLING ARM FOAM STRAP XLG (SOFTGOODS) ×2 IMPLANT
SLING ARM IMMOBILIZER MED (SOFTGOODS) IMPLANT
SPEAR FASTAKII (SLEEVE) IMPLANT
SPONGE GAUZE 4X4 12PLY (GAUZE/BANDAGES/DRESSINGS) ×2 IMPLANT
SPONGE GAUZE 4X4 FOR O.R. (GAUZE/BANDAGES/DRESSINGS) ×2 IMPLANT
SPONGE LAP 4X18 X RAY DECT (DISPOSABLE) ×4 IMPLANT
STRIP CLOSURE SKIN 1/2X4 (GAUZE/BANDAGES/DRESSINGS) ×2 IMPLANT
SUCTION FRAZIER TIP 10 FR DISP (SUCTIONS) ×2 IMPLANT
SUT ETHILON 3 0 PS 1 (SUTURE) ×2 IMPLANT
SUT FIBERWIRE 2-0 18 17.9 3/8 (SUTURE)
SUT PROLENE 3 0 PS 2 (SUTURE) ×2 IMPLANT
SUT VIC AB 0 CT1 27 (SUTURE) ×2
SUT VIC AB 0 CT1 27XBRD ANBCTR (SUTURE) ×2 IMPLANT
SUT VIC AB 1 CT1 27 (SUTURE) ×1
SUT VIC AB 1 CT1 27XBRD ANBCTR (SUTURE) ×1 IMPLANT
SUT VIC AB 2-0 CT1 27 (SUTURE) ×1
SUT VIC AB 2-0 CT1 TAPERPNT 27 (SUTURE) ×1 IMPLANT
SUT VICRYL 0 UR6 27IN ABS (SUTURE) IMPLANT
SUTURE FIBERWR 2-0 18 17.9 3/8 (SUTURE) IMPLANT
SYR 20CC LL (SYRINGE) ×4 IMPLANT
SYR 3ML LL SCALE MARK (SYRINGE) ×2 IMPLANT
SYR TB 1ML LUER SLIP (SYRINGE) ×2 IMPLANT
TAPE CLOTH SURG 6X10 WHT LF (GAUZE/BANDAGES/DRESSINGS) ×2 IMPLANT
TOWEL OR 17X24 6PK STRL BLUE (TOWEL DISPOSABLE) ×2 IMPLANT
TOWEL OR 17X26 10 PK STRL BLUE (TOWEL DISPOSABLE) ×2 IMPLANT
WAND 90 DEG TURBOVAC W/CORD (SURGICAL WAND) IMPLANT
WATER STERILE IRR 1000ML POUR (IV SOLUTION) ×2 IMPLANT

## 2011-08-24 NOTE — Transfer of Care (Signed)
Immediate Anesthesia Transfer of Care Note  Patient: Corey [name]  Procedure(s) Performed:  SHOULDER ARTHROSCOPY WITH SUBACROMIAL DECOMPRESSION, ROTATOR CUFF REPAIR AND BICEP TENDON REPAIR - right shoulder diagnostic operative arthroscopy subacromial decompression   Patient Location: PACU  Anesthesia Type: General  Level of Consciousness: awake  Airway & Oxygen Therapy: Patient Spontanous Breathing and Patient connected to nasal cannula oxygen  Post-op Assessment: Report given to PACU RN  Post vital signs: stable  Complications: No apparent anesthesia complications

## 2011-08-24 NOTE — Brief Op Note (Signed)
08/24/2011  5:28 PM  PATIENT:  Corey [name]  65 y.o. male  PRE-OPERATIVE DIAGNOSIS:  right shoulder bursitis, ac joint oa   POST-OPERATIVE DIAGNOSIS:  right shoulder bursitis, ac joint oa   PROCEDURE:  Procedure(s): SHOULDER ARTHROSCOPY WITH SUBACROMIAL DECOMPRESSION,  Debridement of labrum, arthroscopic distal clavicle excision  SURGEON:  Surgeon(s): Corrie Mckusick Haille Pardi  ASSISTANT:   ANESTHESIA:   regional and general  EBL: 10 ml    Total I/O In: 2000 [I.V.:2000] Out: 0   BLOOD ADMINISTERED: none  DRAINS: none   LOCAL MEDICATIONS USED:  none  SPECIMEN:  No Specimen  COUNTS:  YES  TOURNIQUET:  * No tourniquets in log *  DICTATION: .Other Dictation: Dictation Number [id number]  PLAN OF CARE: Discharge to home after PACU  PATIENT DISPOSITION:  PACU - hemodynamically stable

## 2011-08-24 NOTE — Anesthesia Postprocedure Evaluation (Signed)
  Anesthesia Post-op Note  Patient: Corey [name]  Procedure(s) Performed:  SHOULDER ARTHROSCOPY WITH SUBACROMIAL DECOMPRESSION, ROTATOR CUFF REPAIR AND BICEP TENDON REPAIR - right shoulder diagnostic operative arthroscopy subacromial decompression   Patient Location: PACU  Anesthesia Type: General  Level of Consciousness: awake  Airway and Oxygen Therapy: Patient Spontanous Breathing  Post-op Pain: none  Post-op Assessment: Post-op Vital signs reviewed  Post-op Vital Signs: stable  Complications: No apparent anesthesia complications

## 2011-08-24 NOTE — Preoperative (Addendum)
Beta Blockers   Reason not to administer Beta Blockers:taken by pt. 11/20  0800

## 2011-08-24 NOTE — Anesthesia Procedure Notes (Addendum)
Anesthesia Regional Block:  Interscalene brachial plexus block  Pre-Anesthetic Checklist: ,, timeout performed, Correct Patient, Correct Site, Correct Laterality, Correct Procedure, Correct Position, site marked, Risks and benefits discussed, Surgical consent,  Pre-op evaluation,  At surgeon's request  Laterality: Right  Prep: chloraprep       Needles:  Injection technique: Single-shot  Needle Type: Echogenic Stimulator Needle     Needle Length: 5cm 5 cm     Additional Needles:  Procedures: ultrasound guided Interscalene brachial plexus block  Nerve Stimulator or Paresthesia:  Response: 0.4 mA,   Additional Responses:   Narrative:  Start time: 08/24/2011 2:30 PM End time: 08/24/2011 3:00 PM Injection made incrementally with aspirations every 5 mL.  Performed by: Personally  Anesthesiologist: Arta Bruce MD  Interscalene brachial plexus block Procedure Name: Intubation Date/Time: 08/24/2011 3:32 PM Performed by: Alanda Amass Pre-anesthesia Checklist: Patient identified, Timeout performed, Emergency Drugs available, Suction available and Patient being monitored Patient Re-evaluated:Patient Re-evaluated prior to inductionOxygen Delivery Method: Circle System Utilized Preoxygenation: Pre-oxygenation with 100% oxygen Intubation Type: IV induction Ventilation: Mask ventilation without difficulty Laryngoscope Size: Mac and 3 Grade View: Grade II Tube type: Oral Tube size: 7.5 mm Number of attempts: 1 Placement Confirmation: ETT inserted through vocal cords under direct vision,  positive ETCO2 and breath sounds checked- equal and bilateral Dental Injury: Teeth and Oropharynx as per pre-operative assessment

## 2011-08-24 NOTE — Anesthesia Preprocedure Evaluation (Addendum)
Anesthesia Evaluation  Patient identified by MRN, date of birth, ID band Patient awake    Reviewed: Allergy & Precautions, H&P , NPO status , Patient's Chart, lab work & pertinent test results, reviewed documented beta blocker date and time   History of Anesthesia Complications (+) PONV  Airway Mallampati: II TM Distance: >3 FB Neck ROM: Full    Dental  (+) Teeth Intact and Caps   Pulmonary shortness of breath and with exertion, sleep apnea and Continuous Positive Airway Pressure Ventilation ,          Cardiovascular hypertension, Pt. on home beta blockers and Pt. on medications + CAD and + Cardiac Stents     Neuro/Psych    GI/Hepatic GERD-  Medicated and Controlled,  Endo/Other    Renal/GU      Musculoskeletal   Abdominal   Peds  Hematology   Anesthesia Other Findings   Reproductive/Obstetrics                         Anesthesia Physical Anesthesia Plan  ASA: III  Anesthesia Plan: General   Post-op Pain Management: MAC Combined w/ Regional for Post-op pain   Induction: Intravenous  Airway Management Planned: Oral ETT  Additional Equipment:   Intra-op Plan:   Post-operative Plan: Extubation in OR  Informed Consent: I have reviewed the patients History and Physical, chart, labs and discussed the procedure including the risks, benefits and alternatives for the proposed anesthesia with the patient or authorized representative who has indicated his/her understanding and acceptance.     Plan Discussed with: CRNA and Surgeon  Anesthesia Plan Comments:         Anesthesia Quick Evaluation

## 2011-08-24 NOTE — Interval H&P Note (Signed)
History and Physical Interval Note:   08/24/2011   2:44 PM   Corey [name]  has presented today for surgery, with the diagnosis of right shoulder bursitis  The various methods of treatment have been discussed with the patient and family. After consideration of risks, benefits and other options for treatment, the patient has consented to  Procedure(s): SHOULDER ARTHROSCOPY WITH SUBACROMIAL DECOMPRESSION, ROTATOR CUFF REPAIR AND BICEP TENDON REPAIR as a surgical intervention .  The patients' history has been reviewed, patient examined, no change in status, stable for surgery.  I have reviewed the patients' chart and labs.  Questions were answered to the patient's satisfaction.     Cammy Copa  MD

## 2011-08-24 NOTE — H&P (Signed)
NAME:  Corey [name] NO.:  [id number]  MEDICAL RECORD NO.:  [id number]  LOCATION:                                 FACILITY:  PHYSICIAN:  Burnard Bunting, M.D.    DATE OF BIRTH:  08/01/1946  DATE OF ADMISSION: DATE OF DISCHARGE:                             HISTORY & PHYSICAL   CHIEF COMPLAINT:  Right shoulder pain.  HISTORY OF PRESENT ILLNESS:  Corey [name] is a 65 year old patient with right shoulder pain.  He reports constant pain with restricted movement. He has had cortisone injections x3.  All notes were reviewed.  Last cortisone injection helped for months, but the pain recurred.  He localizes the pain to superior and deltoid aspect of the shoulder.  He takes ibuprofen, which helps.  Pain medicine itself makes him a little bit sick.  It does wake him up on a nightly basis.  The patient has a highest pain tolerance with multiple neck and back surgeries.  He states the shoulder pain has been more aggravating than his back pain.  He has had 3 heart catheterizations.  He was seen last June by his cardiologist.  Symptoms in the shoulder has been going on for a year.  CURRENT MEDICATIONS:  Plavix, metoprolol, lisinopril, Lipitor, aspirin, Flomax, Flonase, Prilosec, tramadol, and methocarbamol.  ALLERGIES:  He is allergic to MONOPRIL and AMOXICILLIN.  PAST SURGICAL HISTORY:  __________ on January 7, stent placed on March 7.  FAMILY MEDICAL HISTORY:  Positive for heart disease, high blood pressure.  No family history of deep vein thrombosis.  SOCIAL HISTORY:  The patient is married.  Disabled.  Does not smoke currently, but has a 20-pack a year smoking history.  Does not drink.  OTHER MEDICAL PROBLEMS:  Include sleep apnea.  REVIEW OF SYSTEMS:  All systems reviewed and are negative in relation to the right shoulder.  PHYSICAL EXAMINATION:  GENERAL:  He is well developed, well nourished, in no acute distress.  Alert and oriented.  Normal body mass  index. Normal gait and alignment. CHEST:  Clear to auscultation. HEART:  Beats in regular rate and rhythm. ABDOMEN:  Benign. EXTREMITIES:  Shoulder has positive impingement sign.  Mild AC joint tenderness.  Good rotator cuff strength.  Isolated __________ supraspinatus cuff __________ muscle testing.  No restriction on external rotation at 15 degrees of abduction.  Passive and active shoulder range of motion on the right is symmetric to the left __________ relocation testing.  MRI scan is reviewed.  It does show bursitis and AC joint arthritis.  IMPRESSION:  Rotator cuff tendinitis and bursitis with some mild acromioclavicular joint arthritis.  PLAN:  The patient has failed conservative management.  He has had 3 injections.  He is taking 3 anti-inflammatories.  He is having night pain and rest pain.  It does not look like this is reticular coming from the neck.  The pain is very much localized into the shoulder region.  He is going to need to see Dr. Elease Hashimoto in Cardiology for cardiac risk stratification.  Plan is for shoulder arthroscopy, possible distal clavicle excision, subacromial decompression.  Risks and benefits were discussed with the patient.  All questions answered.  Patient understands.     Burnard Bunting, M.D.     GSD/MEDQ  D:  08/23/2011  T:  08/23/2011  Job:  [id number]

## 2011-08-25 ENCOUNTER — Ambulatory Visit: Payer: Medicare Other | Admitting: Cardiovascular Disease

## 2011-08-25 NOTE — Op Note (Signed)
NAME:  DELMAR, ARRIAGA NO.:  [id number]  MEDICAL RECORD NO.:  [id number]  LOCATION:  [facility]                         FACILITY:  [facility]  PHYSICIAN:  Burnard Bunting, M.D.    DATE OF BIRTH:  05-21-1946  DATE OF PROCEDURE:  08/24/2011 DATE OF DISCHARGE:  08/24/2011                              OPERATIVE REPORT   PREOPERATIVE DIAGNOSES:  Right shoulder bursitis and acromioclavicular joint arthritis.  POSTOPERATIVE DIAGNOSES:  Right shoulder bursitis and acromioclavicular joint arthritis.  PROCEDURE:  Right shoulder diagnostic arthroscopy, labral debridement, type 1 superior labrum anterior and posterior tear, subacromial decompression with release of the coracoacromial ligament, arthroscopic distal clavicle excision.  SURGEON:  Burnard Bunting, MD.  ASSISTANT:  None.  ANESTHESIA:  General endotracheal.  ESTIMATED BLOOD LOSS:  10 mL.  INDICATIONS:  Corey [name] is a patient with right shoulder pain, who presents for operative management of bursitis and AC joint arthritis after explanation of risks, benefits, and failure of conservative management.  OPERATIVE FINDINGS: 1. Examination under anesthesia, range of motion 0-180 forward     flexion, external rotation 50 degrees, abduction is 70, humeral     abduction is about 95. 2. Diagnostic arthroscopy.     a.     Type 1 SLAP tear.     b.     Fraying of the superior labral tissue, but no instability of      the biceps anchor.     c.     Intact rotator cuff.     d.     Bursitis.     e.     AC joint arthritis.  PROCEDURE IN DETAIL:  The patient was brought to the operating room where general endotracheal anesthesia was induced.  Preoperative antibiotics were administered.  Time-out was called.  Right shoulder, arm, and hand were prescrubbed with alcohol and Betadine scrub, which was allowed to air dry and prepped with DuraPrep solution and draped in a sterile manner.  The patient was placed in a beach-chair position  with the head in neutral position.  Collier Flowers was used to cover the axilla and the field with drapes.  Solution of saline and epinephrine was injected into the subacromial space.  Saline was injected to the shoulder joint. Posterior portal was created.  Diagnostic arthroscopy was performed. Anterior portal was created under direct visualization, which showed fraying of the superior labral tissue, but no instability of the biceps anchor.  This was debrided.  Rotator cuff was intact.  Glenohumeral articular surfaces were intact.  The scope was then placed in subacromial space.  Lateral portal was created.  CA ligament was released.  Subacromial decompression with bursectomy was performed. Distal clavicle was then excised approximately 8-9 mm after coplaning of the distal clavicle to correlate with the subacromial decompression. Following arthroscopic subacromial decompression and distal clavicle excision, shoulder joint was thoroughly irrigated.  Instruments were removed.  Three portals were closed using 3-0 nylon.  Bulky dressing and sling were applied.  The patient tolerated the procedure well without any immediate complication.     Burnard Bunting, M.D.     GSD/MEDQ  D:  08/24/2011  T:  08/24/2011  Job:  [id number]

## 2011-09-20 ENCOUNTER — Ambulatory Visit: Payer: Medicare Other | Admitting: Cardiovascular Disease

## 2011-09-24 ENCOUNTER — Other Ambulatory Visit: Payer: Self-pay | Admitting: *Deleted

## 2011-09-24 MED ORDER — CLOPIDOGREL BISULFATE 75 MG PO TABS: 75.0000 mg | ORAL_TABLET | Freq: Every day | ORAL | Status: DC

## 2011-09-24 NOTE — Telephone Encounter (Signed)
Opened in Error.

## 2011-10-20 ENCOUNTER — Ambulatory Visit (INDEPENDENT_AMBULATORY_CARE_PROVIDER_SITE_OTHER): Payer: Medicare Other | Admitting: Cardiovascular Disease

## 2011-10-20 ENCOUNTER — Encounter: Payer: Self-pay | Admitting: Cardiovascular Disease

## 2011-10-20 DIAGNOSIS — E785 Hyperlipidemia, unspecified: Secondary | ICD-10-CM

## 2011-10-20 DIAGNOSIS — I2510 Atherosclerotic heart disease of native coronary artery without angina pectoris: Secondary | ICD-10-CM

## 2011-10-20 NOTE — Progress Notes (Signed)
Gigi Gin Date of Birth  14-Feb-1946 [facility]     [facility] [place] [facility]  [address]    [address]   [address] [place], [place]  [address]    [place], [place]  [address] [phone]  Fax  [phone]  [phone]  Fax [phone]   History of Present Illness:  Gorje is a 66 yo gentleman with a history of coronary artery disease. Status post PTCA and stenting of his right coronary artery. He also has a history of hypertension, and hyperlipidemia.  He has done well from a cardiac standpoint.  He continues to exercise some - not as much as he needs to.  He has had shoulder surgery several months ago.  Current Outpatient Prescriptions on File Prior to Visit  Medication Sig Dispense Refill  . aspirin 325 MG tablet Take 325 mg by mouth daily.        Marland Kitchen atorvastatin (LIPITOR) 40 MG tablet Take 40 mg by mouth daily.        . clonazePAM (KLONOPIN) 1 MG tablet Take 1 mg by mouth 2 (two) times daily as needed. For anxiety       . clopidogrel (PLAVIX) 75 MG tablet Take 1 tablet (75 mg total) by mouth daily.  30 tablet  5  . fenofibrate 160 MG tablet TAKE 1 TABLET EVERY DAY  90 tablet  3  . fluticasone (FLONASE) 50 MCG/ACT nasal spray Place 2 sprays into the nose daily.        Marland Kitchen lisinopril (PRINIVIL,ZESTRIL) 20 MG tablet Take 20 mg by mouth daily.        Marland Kitchen loratadine-pseudoephedrine (CLARITIN-D 24-HOUR) 10-240 MG per 24 hr tablet Take 1 tablet by mouth daily.        . metoprolol (TOPROL-XL) 50 MG 24 hr tablet Take 50 mg by mouth daily.        . Multiple Vitamins-Minerals (MULTIVITAMINS THER. W/MINERALS) TABS Take 1 tablet by mouth daily.        . nitroGLYCERIN (NITROSTAT) 0.4 MG SL tablet Place 0.4 mg under the tongue every 5 (five) minutes as needed. For chest pain      . omeprazole (PRILOSEC OTC) 20 MG tablet Take 20 mg by mouth daily.        . Tamsulosin HCl (FLOMAX) 0.4 MG CAPS Take 0.4 mg by mouth daily.          Allergies  Allergen Reactions  . Codeine     Heavy  amounts cause nausea  . Monopril (Fosinopril Sodium)     Muscle aches & pain  . Terazosin Hcl     Unknown reaction  . Ampicillin Rash    Past Medical History  Diagnosis Date  . Hypertension   . Dyslipidemia   . Dyspnea   . Chest pain     Sees Dr. Elease Hashimoto, saw last April 2012  . SOB (shortness of breath)   . GERD (gastroesophageal reflux disease)   . Complication of anesthesia     does have small airway  . PONV (postoperative nausea and vomiting)   . Recurrent upper respiratory infection (URI)     3months ago, treated with claritin D and 5 day dose pack  . Pneumonia     1992  . Sleep apnea     cpap  . Arthritis     back, neck, shoulders  . Coronary artery disease     STATUS POST PTCA AND STENTING OF HIS RIGHT CORONARY ARTERY    Past Surgical  History  Procedure Date  . Cardiac catheterization 12/23/2005    NORMAL CHAMBER SIZE AND NORMAL GLOBAL SYSTOLIC FUNCTION. EF 65%  . Cardiac catheterization 12/24/2004    NORMAL LEFT VENTRICULAR SYSTOLIC FUNCTION.THE LEFT VENTRICLE APPEARS TO BE MILDLY ENLARGED.  . Cardiac catheterization 05/27/2003    MILD CARDIOMEGALY. EF 55-60%  . Cervical fusion 07/2001  . Lumbar fusion 10/2003    L5-S1  . Knee arthroscopy 10/2004    LEFT KNEE  . Replacement total knee     left  . Tonsillectomy   . Coronary stent placement     first Aug 2004, 2nd stent 12/2005  . Cardiovascular stress test     2011  . US echocardiography     2011    History  Smoking status  . Former Smoker -- 1.0 packs/day for 20 years  . Types: Cigarettes  . Quit date: 10/04/2002  Smokeless tobacco  . Not on file    History  Alcohol Use No    Family History  Problem Relation Age of Onset  . Hypertension Mother   . Heart disease Father     Reviw of Systems:  Reviewed in the HPI.  All other systems are negative.  Physical Exam: BP 130/75  Pulse 74  Ht 6\' 2"  (1.88 m)  Wt 257 lb 12.8 oz (116.937 kg)  BMI 33.10 kg/m2 The patient is alert and oriented x 3.   The mood and affect are normal.   Skin: warm and dry.  Color is normal.    HEENT:   [place]/AT . Normal carotids, no JVD  Lungs: clear   Heart: RR, normal S1,S2    Abdomen: normal BS  Extremities:  No c/c/e  Neuro:  nonfocal  ECG:  Assessment / Plan:

## 2011-10-20 NOTE — Assessment & Plan Note (Signed)
Corey [name] is doing well. He's not had any episodes of angina. He works out on regular basis.

## 2011-10-20 NOTE — Patient Instructions (Signed)
Your physician wants you to follow-up in: 6 MONTHS You will receive a reminder letter in the mail two months in advance. If you don't receive a letter, please call our office to schedule the follow-up appointment.  Your physician recommends that you return for a FASTING lipid profile: 6 MONTHS    

## 2011-10-20 NOTE — Assessment & Plan Note (Signed)
His last cholesterol levels were checked and his medical doctors office. His HDL remains a little bit low. We will have him continue with a good diet and exercise program and continue with his current medications. We'll check fasting labs at his next office visit in 6 months.

## 2011-11-22 ENCOUNTER — Other Ambulatory Visit: Payer: Self-pay | Admitting: *Deleted

## 2011-11-22 MED ORDER — OMEPRAZOLE MAGNESIUM 20 MG PO TBEC: 20.0000 mg | DELAYED_RELEASE_TABLET | Freq: Every day | ORAL | Status: DC

## 2011-12-02 ENCOUNTER — Other Ambulatory Visit: Payer: Self-pay | Admitting: Cardiovascular Disease

## 2011-12-02 NOTE — Telephone Encounter (Signed)
Refilled lisinopril

## 2011-12-03 ENCOUNTER — Other Ambulatory Visit: Payer: Self-pay | Admitting: Cardiovascular Disease

## 2011-12-03 ENCOUNTER — Other Ambulatory Visit: Payer: Self-pay

## 2011-12-03 MED ORDER — ATORVASTATIN CALCIUM 40 MG PO TABS: 40.0000 mg | ORAL_TABLET | Freq: Every day | ORAL | Status: DC

## 2012-02-07 ENCOUNTER — Other Ambulatory Visit: Payer: Self-pay | Admitting: Cardiovascular Disease

## 2012-02-07 ENCOUNTER — Other Ambulatory Visit: Payer: Self-pay

## 2012-02-07 MED ORDER — METOPROLOL SUCCINATE ER 50 MG PO TB24: 50.0000 mg | ORAL_TABLET | Freq: Every day | ORAL | Status: DC

## 2012-02-07 MED ORDER — ATORVASTATIN CALCIUM 40 MG PO TABS: 40.0000 mg | ORAL_TABLET | Freq: Every day | ORAL | Status: DC

## 2012-02-08 ENCOUNTER — Other Ambulatory Visit: Payer: Self-pay | Admitting: Cardiovascular Disease

## 2012-02-08 MED ORDER — LISINOPRIL 20 MG PO TABS: 20.0000 mg | ORAL_TABLET | Freq: Every day | ORAL | Status: DC

## 2012-02-08 MED ORDER — ATORVASTATIN CALCIUM 40 MG PO TABS: 40.0000 mg | ORAL_TABLET | Freq: Every day | ORAL | Status: DC

## 2012-02-08 NOTE — Telephone Encounter (Signed)
Pt changed from [facility] to [facility]

## 2012-02-08 NOTE — Telephone Encounter (Signed)
Fax Received. Refill Completed. Corey [name] (R.M.A)   

## 2012-03-21 ENCOUNTER — Other Ambulatory Visit: Payer: Self-pay | Admitting: Cardiovascular Disease

## 2012-03-21 MED ORDER — CLOPIDOGREL BISULFATE 75 MG PO TABS: 75.0000 mg | ORAL_TABLET | Freq: Every day | ORAL | Status: DC

## 2012-05-29 ENCOUNTER — Encounter: Payer: Self-pay | Admitting: Cardiovascular Disease

## 2012-05-29 ENCOUNTER — Ambulatory Visit (INDEPENDENT_AMBULATORY_CARE_PROVIDER_SITE_OTHER): Payer: Medicare Other | Admitting: Cardiovascular Disease

## 2012-05-29 VITALS — BP 123/78 | HR 60 | Ht 74.0 in | Wt 258.4 lb

## 2012-05-29 DIAGNOSIS — R079 Chest pain, unspecified: Secondary | ICD-10-CM

## 2012-05-29 DIAGNOSIS — E785 Hyperlipidemia, unspecified: Secondary | ICD-10-CM

## 2012-05-29 DIAGNOSIS — I2510 Atherosclerotic heart disease of native coronary artery without angina pectoris: Secondary | ICD-10-CM

## 2012-05-29 LAB — HEPATIC FUNCTION PANEL
ALT: 33 U/L (ref 0–53)
AST: 36 U/L (ref 0–37)
Albumin: 4.1 g/dL (ref 3.5–5.2)
Alkaline Phosphatase: 48 U/L (ref 39–117)
Bilirubin, Direct: 0.2 mg/dL (ref 0.0–0.3)
Total Bilirubin: 0.9 mg/dL (ref 0.3–1.2)
Total Protein: 7.2 g/dL (ref 6.0–8.3)

## 2012-05-29 LAB — BASIC METABOLIC PANEL
BUN: 26 mg/dL — ABNORMAL HIGH (ref 6–23)
CO2: 25 mEq/L (ref 19–32)
Calcium: 9.4 mg/dL (ref 8.4–10.5)
Chloride: 106 mEq/L (ref 96–112)
Creatinine, Ser: 1.1 mg/dL (ref 0.4–1.5)
GFR: 73.75 mL/min (ref >60.00)
Glucose, Bld: 101 mg/dL — ABNORMAL HIGH (ref 70–99)
Potassium: 5.3 mEq/L — ABNORMAL HIGH (ref 3.5–5.1)
Sodium: 139 mEq/L (ref 135–145)

## 2012-05-29 LAB — LIPID PANEL
Cholesterol: 112 mg/dL (ref 0–200)
HDL: 32.2 mg/dL — ABNORMAL LOW (ref >39.00)
LDL Cholesterol: 62 mg/dL (ref 0–99)
Total CHOL/HDL Ratio: 3
Triglycerides: 90 mg/dL (ref 0.0–149.0)
VLDL: 18 mg/dL (ref 0.0–40.0)

## 2012-05-29 NOTE — Progress Notes (Signed)
Corey [name] Date of Birth  Aug 02, 1946       [facility] Office [address]  [address] [place], [place]  [address]   [place], [place]  [address] [phone]     [phone]   Fax  [phone]    Fax [phone]  Problem List: 1. Coronary artery disease-status post PTCA and stenting of his right coronary artery 2. Hypertension 3. Hyperlipidemia  History of Present Illness: Raihan is a 66 yo gentleman with a history of coronary artery disease. Status post PTCA and stenting of his right coronary artery. He also has a history of hypertension, and hyperlipidemia.  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  Current Outpatient Prescriptions on File Prior to Visit  Medication Sig Dispense Refill  . aspirin 325 MG tablet Take 325 mg by mouth daily.        Marland Kitchen atorvastatin (LIPITOR) 40 MG tablet Take 1 tablet (40 mg total) by mouth daily.  30 tablet  5  . clonazePAM (KLONOPIN) 1 MG tablet Take 1 mg by mouth 2 (two) times daily as needed. For anxiety       . clopidogrel (PLAVIX) 75 MG tablet Take 1 tablet (75 mg total) by mouth daily.  30 tablet  5  . fenofibrate 160 MG tablet TAKE 1 TABLET EVERY DAY  90 tablet  3  . fluticasone (FLONASE) 50 MCG/ACT nasal spray Place 2 sprays into the nose daily.        Marland Kitchen lisinopril (PRINIVIL,ZESTRIL) 20 MG tablet Take 1 tablet (20 mg total) by mouth daily.  30 tablet  5  . loratadine-pseudoephedrine (CLARITIN-D 24-HOUR) 10-240 MG per 24 hr tablet Take 1 tablet by mouth daily.        . metoprolol succinate (TOPROL-XL) 50 MG 24 hr tablet Take 1 tablet (50 mg total) by mouth daily.  90 tablet  4  . Multiple Vitamins-Minerals (MULTIVITAMINS THER. W/MINERALS) TABS Take 1 tablet by mouth daily.        . nitroGLYCERIN (NITROSTAT) 0.4 MG SL tablet Place 0.4 mg under the tongue every 5 (five) minutes as needed. For chest pain      . omeprazole (PRILOSEC) 10 MG capsule Take 10 mg by mouth  daily.      . Tamsulosin HCl (FLOMAX) 0.4 MG CAPS Take 0.4 mg by mouth daily.          Allergies  Allergen Reactions  . Codeine     Heavy amounts cause nausea  . Monopril (Fosinopril Sodium)     Muscle aches & pain  . Terazosin Hcl     Unknown reaction  . Ampicillin Rash    Past Medical History  Diagnosis Date  . Hypertension   . Dyslipidemia   . Dyspnea   . Chest pain     Sees Dr. Elease Hashimoto, saw last April 2012  . SOB (shortness of breath)   . GERD (gastroesophageal reflux disease)   . Complication of anesthesia     does have small airway  . PONV (postoperative nausea and vomiting)   . Recurrent upper respiratory infection (URI)     3months ago, treated with claritin D and 5 day dose pack  . Pneumonia     1992  . Sleep apnea     cpap  . Arthritis     back, neck, shoulders  . Coronary artery disease     STATUS POST PTCA  AND STENTING OF HIS RIGHT CORONARY ARTERY    Past Surgical History  Procedure Date  . Cardiac catheterization 12/23/2005    NORMAL CHAMBER SIZE AND NORMAL GLOBAL SYSTOLIC FUNCTION. EF 65%  . Cardiac catheterization 12/24/2004    NORMAL LEFT VENTRICULAR SYSTOLIC FUNCTION.THE LEFT VENTRICLE APPEARS TO BE MILDLY ENLARGED.  . Cardiac catheterization 05/27/2003    MILD CARDIOMEGALY. EF 55-60%  . Cervical fusion 07/2001  . Lumbar fusion 10/2003    L5-S1  . Knee arthroscopy 10/2004    LEFT KNEE  . Replacement total knee     left  . Tonsillectomy   . Coronary stent placement     first Aug 2004, 2nd stent 12/2005  . Cardiovascular stress test     2011  . US echocardiography     2011    History  Smoking status  . Former Smoker -- 1.0 packs/day for 20 years  . Types: Cigarettes  . Quit date: 10/04/2002  Smokeless tobacco  . Never Used    History  Alcohol Use No    Family History  Problem Relation Age of Onset  . Hypertension Mother   . Heart disease Father     Reviw of Systems:  Reviewed in the HPI.  All other systems are  negative.  Physical Exam: Blood pressure 123/78, pulse 60, height 6\' 2"  (1.88 m), weight 258 lb 6.4 oz (117.209 kg). General: Well developed, well nourished, in no acute distress.  Head: Normocephalic, atraumatic, sclera non-icteric, mucus membranes are moist,   Neck: Supple. Carotids are 2 + without bruits. No JVD  Lungs: Clear bilaterally to auscultation.  Heart: regular rate.  normal  S1 S2. No murmurs, gallops or rubs.  Abdomen: Soft, non-tender, non-distended with normal bowel sounds. No hepatomegaly. No rebound/guarding. No masses.  Msk:  Strength and tone are normal  Extremities: No clubbing or cyanosis. No edema.  Distal pedal pulses are 2+ and equal bilaterally.  Neuro: Alert and oriented X 3. Moves all extremities spontaneously.  Psych:  Responds to questions appropriately with a normal affect.  ECG: May 29, 2012 - NSR at 60. No ST or T wave changes  Assessment / Plan:

## 2012-05-29 NOTE — Patient Instructions (Addendum)
Your physician wants you to follow-up in: 6 months You will receive a reminder letter in the mail two months in advance. If you don't receive a letter, please call our office to schedule the follow-up appointment.  Your physician recommends that you return for a FASTING lipid profile: today and in 6 months  

## 2012-05-29 NOTE — Assessment & Plan Note (Signed)
Corey [name] has done well. We'll continue with the same medications. He has gained a little bit of weight. I've encouraged him to work on a good diet and exercise program in an effort to lose weight.  We'll see him again in 6 months. We'll fasting lipids and get an EKG at that time.

## 2012-06-20 ENCOUNTER — Other Ambulatory Visit: Payer: Self-pay | Admitting: Cardiovascular Disease

## 2012-06-21 NOTE — Telephone Encounter (Signed)
Fax Received. Refill Completed. Corey [name] (R.M.A)   

## 2012-07-04 DIAGNOSIS — I639 Cerebral infarction, unspecified: Secondary | ICD-10-CM

## 2012-07-04 HISTORY — DX: Cerebral infarction, unspecified: I63.9

## 2012-07-05 ENCOUNTER — Encounter (HOSPITAL_COMMUNITY): Payer: Self-pay | Admitting: Emergency Medicine

## 2012-07-05 ENCOUNTER — Observation Stay (HOSPITAL_COMMUNITY)
Admission: EM | Admit: 2012-07-05 | Discharge: 2012-07-07 | Disposition: A | Discharge status: Home / Self Care | Payer: Medicare Other | Attending: Internal Medicine | Admitting: Internal Medicine

## 2012-07-05 DIAGNOSIS — Z6833 Body mass index (BMI) 33.0-33.9, adult: Secondary | ICD-10-CM | POA: Insufficient documentation

## 2012-07-05 DIAGNOSIS — R0602 Shortness of breath: Secondary | ICD-10-CM

## 2012-07-05 DIAGNOSIS — I10 Essential (primary) hypertension: Secondary | ICD-10-CM | POA: Insufficient documentation

## 2012-07-05 DIAGNOSIS — E669 Obesity, unspecified: Secondary | ICD-10-CM | POA: Insufficient documentation

## 2012-07-05 DIAGNOSIS — H538 Other visual disturbances: Secondary | ICD-10-CM | POA: Insufficient documentation

## 2012-07-05 DIAGNOSIS — R4789 Other speech disturbances: Secondary | ICD-10-CM | POA: Insufficient documentation

## 2012-07-05 DIAGNOSIS — R0600 Dyspnea, unspecified: Secondary | ICD-10-CM

## 2012-07-05 DIAGNOSIS — I2510 Atherosclerotic heart disease of native coronary artery without angina pectoris: Secondary | ICD-10-CM | POA: Diagnosis present

## 2012-07-05 DIAGNOSIS — K219 Gastro-esophageal reflux disease without esophagitis: Secondary | ICD-10-CM | POA: Diagnosis present

## 2012-07-05 DIAGNOSIS — Z9861 Coronary angioplasty status: Secondary | ICD-10-CM | POA: Insufficient documentation

## 2012-07-05 DIAGNOSIS — G459 Transient cerebral ischemic attack, unspecified: Secondary | ICD-10-CM | POA: Insufficient documentation

## 2012-07-05 DIAGNOSIS — E785 Hyperlipidemia, unspecified: Secondary | ICD-10-CM | POA: Diagnosis present

## 2012-07-05 DIAGNOSIS — R209 Unspecified disturbances of skin sensation: Secondary | ICD-10-CM | POA: Insufficient documentation

## 2012-07-05 DIAGNOSIS — I639 Cerebral infarction, unspecified: Secondary | ICD-10-CM | POA: Diagnosis present

## 2012-07-05 DIAGNOSIS — I6350 Cerebral infarction due to unspecified occlusion or stenosis of unspecified cerebral artery: Principal | ICD-10-CM | POA: Insufficient documentation

## 2012-07-05 NOTE — ED Notes (Signed)
Pt & wife report that about 9pm she went upstairs and pt was watching tv, says he is unsure if he fell asleep or passed out; when he awoke it was about 1030-1045pm and was disoriented, R arm was numb, had slurred speech, and nausea; all symptoms have resolved except for nausea;  Pt denies pain; pt a&ox4, speech clear, no drift, grips equal, face symmetrical; pt reports recently being on cold medicine and a zpack

## 2012-07-06 ENCOUNTER — Observation Stay (HOSPITAL_COMMUNITY): Payer: Medicare Other

## 2012-07-06 DIAGNOSIS — I6350 Cerebral infarction due to unspecified occlusion or stenosis of unspecified cerebral artery: Principal | ICD-10-CM

## 2012-07-06 DIAGNOSIS — E785 Hyperlipidemia, unspecified: Secondary | ICD-10-CM

## 2012-07-06 DIAGNOSIS — I2510 Atherosclerotic heart disease of native coronary artery without angina pectoris: Secondary | ICD-10-CM

## 2012-07-06 DIAGNOSIS — I639 Cerebral infarction, unspecified: Secondary | ICD-10-CM | POA: Diagnosis present

## 2012-07-06 DIAGNOSIS — I10 Essential (primary) hypertension: Secondary | ICD-10-CM

## 2012-07-06 DIAGNOSIS — G459 Transient cerebral ischemic attack, unspecified: Secondary | ICD-10-CM

## 2012-07-06 LAB — CBC
HCT: 39.7 % (ref 39.0–52.0)
Hemoglobin: 13.2 g/dL (ref 13.0–17.0)
MCH: 30.2 pg (ref 26.0–34.0)
MCHC: 33.2 g/dL (ref 30.0–36.0)
MCV: 90.8 fL (ref 78.0–100.0)
Platelets: 224 10*3/uL (ref 150–400)
RBC: 4.37 MIL/uL (ref 4.22–5.81)
RDW: 13.4 % (ref 11.5–15.5)
WBC: 5.6 10*3/uL (ref 4.0–10.5)

## 2012-07-06 LAB — LIPID PANEL
Cholesterol: 104 mg/dL (ref 0–200)
HDL: 25 mg/dL — ABNORMAL LOW (ref >39)
LDL Cholesterol: 46 mg/dL (ref 0–99)
Total CHOL/HDL Ratio: 4.2 RATIO
Triglycerides: 165 mg/dL — ABNORMAL HIGH (ref <150)
VLDL: 33 mg/dL (ref 0–40)

## 2012-07-06 LAB — PROTIME-INR
INR: 0.97 (ref 0.00–1.49)
Prothrombin Time: 12.8 s (ref 11.6–15.2)

## 2012-07-06 LAB — COMPREHENSIVE METABOLIC PANEL WITH GFR
ALT: 27 U/L (ref 0–53)
AST: 27 U/L (ref 0–37)
Albumin: 3.9 g/dL (ref 3.5–5.2)
Alkaline Phosphatase: 67 U/L (ref 39–117)
BUN: 20 mg/dL (ref 6–23)
CO2: 23 meq/L (ref 19–32)
Calcium: 9.5 mg/dL (ref 8.4–10.5)
Chloride: 103 meq/L (ref 96–112)
Creatinine, Ser: 1.2 mg/dL (ref 0.50–1.35)
GFR calc Af Amer: 72 mL/min — ABNORMAL LOW
GFR calc non Af Amer: 62 mL/min — ABNORMAL LOW
Glucose, Bld: 124 mg/dL — ABNORMAL HIGH (ref 70–99)
Potassium: 4.2 meq/L (ref 3.5–5.1)
Sodium: 137 meq/L (ref 135–145)
Total Bilirubin: 0.3 mg/dL (ref 0.3–1.2)
Total Protein: 7 g/dL (ref 6.0–8.3)

## 2012-07-06 LAB — HEMOGLOBIN A1C
Hgb A1c MFr Bld: 6 % — ABNORMAL HIGH (ref <5.7)
Mean Plasma Glucose: 126 mg/dL — ABNORMAL HIGH (ref <117)

## 2012-07-06 LAB — APTT: aPTT: 32 seconds (ref 24–37)

## 2012-07-06 MED ORDER — METOPROLOL SUCCINATE ER 50 MG PO TB24
50.0000 mg | ORAL_TABLET | Freq: Every day | ORAL | Status: DC
  Administered 2012-07-06 – 2012-07-07 (×2): 50 mg via ORAL
  Filled 2012-07-06 (×2): qty 1

## 2012-07-06 MED ORDER — FLUTICASONE PROPIONATE 50 MCG/ACT NA SUSP
2.0000 | Freq: Every day | NASAL | Status: DC
Start: 2012-07-06 — End: 2012-07-07
  Administered 2012-07-06 – 2012-07-07 (×2): 2 via NASAL
  Filled 2012-07-06: qty 16

## 2012-07-06 MED ORDER — ASPIRIN 325 MG PO TABS
325.0000 mg | ORAL_TABLET | Freq: Every day | ORAL | Status: DC
Start: 2012-07-06 — End: 2012-07-07
  Filled 2012-07-06 (×2): qty 1

## 2012-07-06 MED ORDER — FENOFIBRATE 160 MG PO TABS
160.0000 mg | ORAL_TABLET | Freq: Every day | ORAL | Status: DC
  Administered 2012-07-06 – 2012-07-07 (×2): 160 mg via ORAL
  Filled 2012-07-06 (×2): qty 1

## 2012-07-06 MED ORDER — PANTOPRAZOLE SODIUM 40 MG PO TBEC
40.0000 mg | DELAYED_RELEASE_TABLET | Freq: Every day | ORAL | Status: DC
  Administered 2012-07-07: 40 mg via ORAL
  Filled 2012-07-06: qty 1

## 2012-07-06 MED ORDER — SODIUM CHLORIDE 0.9 % IJ SOLN
3.0000 mL | Freq: Two times a day (BID) | INTRAMUSCULAR | Status: DC
  Administered 2012-07-06 (×2): 3 mL via INTRAVENOUS

## 2012-07-06 MED ORDER — ASPIRIN 300 MG RE SUPP
300.0000 mg | Freq: Every day | RECTAL | Status: DC
  Filled 2012-07-06: qty 1

## 2012-07-06 MED ORDER — LISINOPRIL 20 MG PO TABS
20.0000 mg | ORAL_TABLET | Freq: Every day | ORAL | Status: DC
  Administered 2012-07-06 – 2012-07-07 (×2): 20 mg via ORAL
  Filled 2012-07-06 (×2): qty 1

## 2012-07-06 MED ORDER — LORAZEPAM 1 MG PO TABS
2.0000 mg | ORAL_TABLET | Freq: Once | ORAL | Status: AC
  Administered 2012-07-06: 2 mg via ORAL
  Filled 2012-07-06: qty 2

## 2012-07-06 MED ORDER — ATORVASTATIN CALCIUM 40 MG PO TABS
40.0000 mg | ORAL_TABLET | Freq: Every day | ORAL | Status: DC
  Administered 2012-07-06 – 2012-07-07 (×2): 40 mg via ORAL
  Filled 2012-07-06 (×2): qty 1

## 2012-07-06 MED ORDER — SENNOSIDES-DOCUSATE SODIUM 8.6-50 MG PO TABS
1.0000 | ORAL_TABLET | Freq: Every evening | ORAL | Status: DC | PRN
  Filled 2012-07-06: qty 1

## 2012-07-06 MED ORDER — SODIUM CHLORIDE 0.9 % IJ SOLN: 3.0000 mL | INTRAMUSCULAR | Status: DC | PRN

## 2012-07-06 MED ORDER — CLOPIDOGREL BISULFATE 75 MG PO TABS
75.0000 mg | ORAL_TABLET | Freq: Every day | ORAL | Status: DC
  Administered 2012-07-06 – 2012-07-07 (×2): 75 mg via ORAL
  Filled 2012-07-06 (×3): qty 1

## 2012-07-06 MED ORDER — ASPIRIN 325 MG PO TABS
325.0000 mg | ORAL_TABLET | Freq: Every day | ORAL | Status: DC
  Administered 2012-07-06 – 2012-07-07 (×2): 325 mg via ORAL
  Filled 2012-07-06 (×3): qty 1

## 2012-07-06 MED ORDER — SODIUM CHLORIDE 0.9 % IV SOLN: 250.0000 mL | INTRAVENOUS | Status: DC | PRN

## 2012-07-06 MED ORDER — NITROGLYCERIN 0.4 MG SL SUBL: 0.4000 mg | SUBLINGUAL_TABLET | SUBLINGUAL | Status: DC | PRN

## 2012-07-06 MED ORDER — TAMSULOSIN HCL 0.4 MG PO CAPS
0.4000 mg | ORAL_CAPSULE | Freq: Every day | ORAL | Status: DC
  Administered 2012-07-06 – 2012-07-07 (×2): 0.4 mg via ORAL
  Filled 2012-07-06 (×2): qty 1

## 2012-07-06 MED ORDER — ENOXAPARIN SODIUM 40 MG/0.4ML ~~LOC~~ SOLN
40.0000 mg | SUBCUTANEOUS | Status: DC
  Administered 2012-07-06 – 2012-07-07 (×2): 40 mg via SUBCUTANEOUS
  Filled 2012-07-06 (×4): qty 0.4

## 2012-07-06 MED ORDER — ADULT MULTIVITAMIN W/MINERALS CH
1.0000 | ORAL_TABLET | Freq: Every day | ORAL | Status: DC
  Administered 2012-07-06 – 2012-07-07 (×2): 1 via ORAL
  Filled 2012-07-06 (×2): qty 1

## 2012-07-06 MED ORDER — LORAZEPAM 2 MG/ML IJ SOLN
1.0000 mg | Freq: Once | INTRAMUSCULAR | Status: AC
  Administered 2012-07-06: 1 mg via INTRAVENOUS
  Filled 2012-07-06: qty 1

## 2012-07-06 NOTE — Progress Notes (Signed)
  Echocardiogram 2D Echocardiogram has been performed.  Corey [name] 07/06/2012, 8:44 AM

## 2012-07-06 NOTE — H&P (Signed)
[facility] History and Physical  Corey [name] ZOX:[id number] DOB: November 19, 1945 DOA: 07/05/2012  Referring physician: Buelah Manis PCP: Darrow Bussing, MD  Specialists: nasher Chief Complaint: right arm numbness/slurred speech/confusion  HPI: Corey [name] is a 66 y.o. male male presents to the emergency department on 07/05/12 late evening with complaint of brief altered mental status. The patient reports he was in his recliner watching TV around 9:30 when he either fell asleep or passed out.  Patient woke around 10:30-1045 and was acutely disoriented, felt like he was outside of his body and felt very uneasy. Patient was unable to work the remote for his TV set, which is highly unusual, reports both himself and his wife. He reports his right arm was numb with a pins and needle sensation from the shoulder all the way down to his fingertips. He denies any weakness or loss of sensation. He reports some blurred vision in his right eye. The symptoms continued, and he went upstairs to talk to his wife. There he noted that he was having difficulties getting words out. Wife reports he had slurred speech. They debated at home whether to come to the emergency department, and finally decided to come in. During the drive to the emergency department, symptoms slowly resolved. They estimate symptoms lasted about an hour and 15 minutes to an hour and 30 minutes. He stayed in the ED all night and had complete stroke workup. MRI/MRA confirmed infarct. Remains with slightly slurred speech, some word searching and confusion. Patient with past medical history of coronary disease status post 2 stents, hypertension, hyperlipidemia. Patient reports he recently was treated for an upper respiratory illness with a Z-Pak and cough medicine. Finished antibiotics 2 days ago. [facility] asked to admit   Review of Systems: The patient denies anorexia, fever, weight loss,, vision loss, decreased hearing, hoarseness, chest pain, ,  dyspnea on exertion, peripheral edema, balance deficits, hemoptysis, abdominal pain, melena, hematochezia, severe indigestion/heartburn, hematuria, incontinence, genital sores, muscle weakness, suspicious skin lesions, transient blindness, difficulty walking, depression, unusual weight change, abnormal bleeding, enlarged lymph nodes, angioedema, and breast masses.   Past Medical History  Diagnosis Date  . Hypertension   . Dyslipidemia   . Dyspnea   . Chest pain     Sees Dr. Elease Hashimoto, saw last April 2012  . SOB (shortness of breath)   . GERD (gastroesophageal reflux disease)   . Complication of anesthesia     does have small airway  . PONV (postoperative nausea and vomiting)   . Recurrent upper respiratory infection (URI)     3months ago, treated with claritin D and 5 day dose pack  . Pneumonia     1992  . Sleep apnea     cpap  . Arthritis     back, neck, shoulders  . Coronary artery disease     STATUS POST PTCA AND STENTING OF HIS RIGHT CORONARY ARTERY   Past Surgical History  Procedure Date  . Cardiac catheterization 12/23/2005    NORMAL CHAMBER SIZE AND NORMAL GLOBAL SYSTOLIC FUNCTION. EF 65%  . Cardiac catheterization 12/24/2004    NORMAL LEFT VENTRICULAR SYSTOLIC FUNCTION.THE LEFT VENTRICLE APPEARS TO BE MILDLY ENLARGED.  . Cardiac catheterization 05/27/2003    MILD CARDIOMEGALY. EF 55-60%  . Cervical fusion 07/2001  . Lumbar fusion 10/2003    L5-S1  . Knee arthroscopy 10/2004    LEFT KNEE  . Replacement total knee     left  . Tonsillectomy   . Coronary stent placement  first Aug 2004, 2nd stent 12/2005  . Cardiovascular stress test     2011  . US echocardiography     2011   Social History:  reports that he quit smoking about 9 years ago. His smoking use included Cigarettes. He has a 20 pack-year smoking history. He has never used smokeless tobacco. He reports that he does not drink alcohol or use illicit drugs. Lives at home with wife of 29 years. Has 3 children. Semi  retired. Independent with [employer]'s Allergies  Allergen Reactions  . Codeine     Heavy amounts cause nausea  . Monopril (Fosinopril Sodium)     Muscle aches & pain  . Terazosin Hcl     Unknown reaction  . Ampicillin Rash    Family History  Problem Relation Age of Onset  . Hypertension Mother   . Heart disease Father      Prior to Admission medications   Medication Sig Start Date End Date Taking? Authorizing Provider  aspirin 325 MG tablet Take 325 mg by mouth daily.     Yes Historical Provider, MD  atorvastatin (LIPITOR) 40 MG tablet Take 1 tablet (40 mg total) by mouth daily. 02/08/12  Yes Vesta Mixer, MD  clopidogrel (PLAVIX) 75 MG tablet Take 1 tablet (75 mg total) by mouth daily. 03/21/12  Yes Vesta Mixer, MD  fenofibrate 160 MG tablet TAKE 1 TABLET EVERY DAY 06/20/12  Yes Vesta Mixer, MD  fluticasone [facility]) 50 MCG/ACT nasal spray Place 2 sprays into the nose daily.     Yes Historical Provider, MD  lisinopril (PRINIVIL,ZESTRIL) 20 MG tablet Take 1 tablet (20 mg total) by mouth daily. 02/08/12  Yes Vesta Mixer, MD  metoprolol succinate (TOPROL-XL) 50 MG 24 hr tablet Take 1 tablet (50 mg total) by mouth daily. 02/07/12  Yes Vesta Mixer, MD  Multiple Vitamins-Minerals (MULTIVITAMINS THER. W/MINERALS) TABS Take 1 tablet by mouth daily.     Yes Historical Provider, MD  omeprazole (PRILOSEC) 10 MG capsule Take 10 mg by mouth daily.   Yes Historical Provider, MD  Tamsulosin HCl (FLOMAX) 0.4 MG CAPS Take 0.4 mg by mouth daily.     Yes Historical Provider, MD  nitroGLYCERIN (NITROSTAT) 0.4 MG SL tablet Place 0.4 mg under the tongue every 5 (five) minutes as needed. For chest pain    Historical Provider, MD   Physical Exam: Filed Vitals:   07/06/12 1130 07/06/12 1315 07/06/12 1330 07/06/12 1413  BP: 106/47 122/64 119/97 119/97  Pulse: 62 74 72 93  Temp:      TempSrc:      Resp:      SpO2: 94% 94% 96% 96%     General:  Awake alert well nourished NAD  Eyes: PERRL EOMI  no scleral icterus  ENT: mucus membranes mouth pink/only slightly dry, ears clear, nose without drainage  Neck: supple, no JVD full rom  Cardiovascular: RRR No MGR No LEE PPP  Respiratory: normal effort, BSCTAB No wheeze, no rhonchi  Abdomen: obese, soft, +BS, non-tender to palpation  Skin: warm/dry/no rash or lesion noted  Musculoskeletal: MOE. No joint swelling/erythema. Non tender to palpation  Psychiatric: appropriate, cooperative  Neurologic: AOx3. Speech slightly slurred, some word searching, bilateral LE strength 5. Rt upper extremity slightly weaker left 4/5. Sensation intact  Labs on Admission:  Basic Metabolic Panel:  Lab 07/06/12 1610  NA 137  K 4.2  CL 103  CO2 23  GLUCOSE 124*  BUN 20  CREATININE 1.20  CALCIUM  9.5  MG --  PHOS --   Liver Function Tests:  Lab 07/06/12 0024  AST 27  ALT 27  ALKPHOS 67  BILITOT 0.3  PROT 7.0  ALBUMIN 3.9   No results found for this basename: LIPASE:5,AMYLASE:5 in the last 168 hours No results found for this basename: AMMONIA:5 in the last 168 hours CBC:  Lab 07/06/12 0024  WBC 5.6  NEUTROABS --  HGB 13.2  HCT 39.7  MCV 90.8  PLT 224   Cardiac Enzymes: No results found for this basename: CKTOTAL:5,CKMB:5,CKMBINDEX:5,TROPONINI:5 in the last 168 hours  BNP (last 3 results) No results found for this basename: PROBNP:3 in the last 8760 hours CBG: No results found for this basename: GLUCAP:5 in the last 168 hours  Radiological Exams on Admission: Ct Head Wo Contrast  07/06/2012  *RADIOLOGY REPORT*  Clinical Data: Right-sided weakness, slurred speech.  CT HEAD WITHOUT CONTRAST  Technique:  Contiguous axial images were obtained from the base of the skull through the vertex without contrast.  Comparison: None.  Findings: Mild periventricular and subcortical white matter changes, nonspecific however often seen in the setting of chronic microangiopathic change.  Tiny hypodensity within the left caudate head as seen on  series 2 image 13.  No mass effect. There is no evidence for acute hemorrhage, hydrocephalus, mass lesion, or abnormal extra-axial fluid collection.  No definite CT evidence for acute cortical based infarction.  The visualized paranasal sinuses and mastoid air cells are predominately clear.  IMPRESSION: Tiny hypodensity within the left caudate head may represent volume averaging or age indeterminate lacunar infarction. No mass effect.  Mild white matter changes as above.  Otherwise, no CT evidence of acute intracranial abnormality. MRI has increased sensitivity for acute ischemia detection if clinical concern persists.   Original Report Authenticated By: Waneta Martins, M.D.    Mr Brain Wo Contrast  07/06/2012  *RADIOLOGY REPORT*  Clinical Data:  Right-sided weakness and slurred speech.  MRI HEAD WITHOUT CONTRAST MRA HEAD WITHOUT CONTRAST  Technique:  Multiplanar, multiecho pulse sequences of the brain and surrounding structures were obtained without intravenous contrast. Angiographic images of the head were obtained using MRA technique without contrast.  Comparison:  CT head without contrast 07/06/2012.  MRI HEAD  Findings:  Scattered foci of restricted effusion are present within the posterior left insular cortex, posterior left frontal and parietal lobes.  These reside within the posterior left MCA distribution.  No hemorrhage or mass lesion is evident.  T2 hyperintensities correspond with the areas of restricted diffusion. No right-sided infarcts are present.  Mild atrophy is present.  There are additional subcortical T2 hyperintensities bilaterally, greater than expected for age.  Flow is present in the major intracranial arteries.  The globes and orbits are intact.  The paranasal sinuses and mastoid air cells are clear.  IMPRESSION:  1.  Acute / subacute scattered posterior left MCA territory infarcts. 2.  Atrophy and diffuse white matter changes.  There is a greater than expected for age. The finding  is nonspecific but can be seen in the setting of chronic microvascular ischemia, a demyelinating process such as multiple sclerosis, vasculitis, complicated migraine headaches, or as the sequelae of a prior infectious or inflammatory process.  MRA HEAD  Findings: The internal carotid arteries demonstrate mild irregularity through the cavernous segment bilaterally.  There is no significant stenosis relative to the distal vessel.  The A1 and M1 segments are normal.  No definite anterior communicating artery is evident.  There is asymmetric attenuation  of distal left and three vessels posteriorly, likely contributing to the areas of infarction.  No significant proximal stenosis or occlusion is evident.  The vertebral arteries are codominant.  The left PICA origin is visualized and within normal limits.  The right AICA is noted.  The basilar artery is within normal limits.  Both posterior cerebral arteries originate from basilar tip.  The PCA branch vessels are within normal limits.  IMPRESSION:  1.  Asymmetric attenuation of distal left MCA branch vessels.  This corresponds with the area of acute infarction.  No significant proximal stenosis or occlusion is evident. 2.  No other significant proximal stenosis, aneurysm, or branch vessel occlusion.  These results were called by telephone on 07/06/2012 at 1 o'clock p.m. to Dr. Oletta Lamas, who verbally acknowledged these results.   Original Report Authenticated By: Jamesetta Orleans. MATTERN, M.D.    Mr Maxine Glenn Head/brain Wo Cm  07/06/2012  *RADIOLOGY REPORT*  Clinical Data:  Right-sided weakness and slurred speech.  MRI HEAD WITHOUT CONTRAST MRA HEAD WITHOUT CONTRAST  Technique:  Multiplanar, multiecho pulse sequences of the brain and surrounding structures were obtained without intravenous contrast. Angiographic images of the head were obtained using MRA technique without contrast.  Comparison:  CT head without contrast 07/06/2012.  MRI HEAD  Findings:  Scattered foci of restricted  effusion are present within the posterior left insular cortex, posterior left frontal and parietal lobes.  These reside within the posterior left MCA distribution.  No hemorrhage or mass lesion is evident.  T2 hyperintensities correspond with the areas of restricted diffusion. No right-sided infarcts are present.  Mild atrophy is present.  There are additional subcortical T2 hyperintensities bilaterally, greater than expected for age.  Flow is present in the major intracranial arteries.  The globes and orbits are intact.  The paranasal sinuses and mastoid air cells are clear.  IMPRESSION:  1.  Acute / subacute scattered posterior left MCA territory infarcts. 2.  Atrophy and diffuse white matter changes.  There is a greater than expected for age. The finding is nonspecific but can be seen in the setting of chronic microvascular ischemia, a demyelinating process such as multiple sclerosis, vasculitis, complicated migraine headaches, or as the sequelae of a prior infectious or inflammatory process.  MRA HEAD  Findings: The internal carotid arteries demonstrate mild irregularity through the cavernous segment bilaterally.  There is no significant stenosis relative to the distal vessel.  The A1 and M1 segments are normal.  No definite anterior communicating artery is evident.  There is asymmetric attenuation of distal left and three vessels posteriorly, likely contributing to the areas of infarction.  No significant proximal stenosis or occlusion is evident.  The vertebral arteries are codominant.  The left PICA origin is visualized and within normal limits.  The right AICA is noted.  The basilar artery is within normal limits.  Both posterior cerebral arteries originate from basilar tip.  The PCA branch vessels are within normal limits.  IMPRESSION:  1.  Asymmetric attenuation of distal left MCA branch vessels.  This corresponds with the area of acute infarction.  No significant proximal stenosis or occlusion is evident.  2.  No other significant proximal stenosis, aneurysm, or branch vessel occlusion.  These results were called by telephone on 07/06/2012 at 1 o'clock p.m. to Dr. Oletta Lamas, who verbally acknowledged these results.   Original Report Authenticated By: Jamesetta Orleans. MATTERN, M.D.     EKG: Independently reviewed.   Assessment/Plan Principal Problem: CVA (cerebral infarction) will admit to tele.  Workup yields MRI showing acute / subacute scattered posterior left MCA territory . Echo mild LVH, 60% EF grade 1 diastolic dysfunction, carotid dop yields  right internal carotid artery has no evidence of hemodynamically significant stenosis. The left internal carotid artery demonstrates elevated velocities in the 40-59% range.Vertebrals are patent with antegrade flow. Triglycerides 165, HDL 25. Will continue home ASA and plavix. Will monitor tele for 24 hours. Will request neuro consult. Request PT/OT   Active Problems:  Coronary artery disease: S/P PTCA with stent. Dr. Melburn Popper has on plavix and asa.  No chest pain. Will continue home meds  Hypertension: controlled. Will continue home meds with parameters  Dyslipidemia: continue statin  GERD (gastroesophageal reflux disease): stable at baseline. Continue PPI   Code Status: full Family Communication: pt and wife and sons at bedside Disposition Plan:  Home when medically stable. Hopefully 24 hours  Time spent: 35 minutes  Gwenyth Bender  NP [facility] Pager [phone]  If 7PM-7AM, please contact night-coverage [email or web address] Password [email or web address] 07/06/2012, 3:20 PM

## 2012-07-06 NOTE — ED Notes (Signed)
Spoke with MRI - they advised that patient test will be after 1200p.   Advised patient and family.

## 2012-07-06 NOTE — ED Notes (Signed)
Patient ambulated to restroom. Patient tolerated well.

## 2012-07-06 NOTE — Progress Notes (Signed)
*  PRELIMINARY RESULTS* Vascular Ultrasound Carotid Duplex (Doppler) has been completed.   The left internal carotid artery demonstrates elevated velocities in the 40-59% range, supported by plaque morphology. There is no obvious evidence of hemodynamically significant stenosis of the right internal carotid artery. Vertebral arteries are patent with antegrade flow.  07/06/2012 8:40 AM Gertie Fey, RDMS, RDCS

## 2012-07-06 NOTE — ED Provider Notes (Signed)
7:46 AM Assumed care of patient in the CDU.  Patient is currently on TIA protocol.  Patient is awaiting MRI brain, MRA brain, Carotid Doppler, and an Echocardiogram.  Patient with a history of HTN, Hyperlipidemia, and Coronary Artery disease presented to the ED earlier this morning with a chief complaint of brief altered mental status, right arm numbness, difficulty with word finding, and slurred speech.  Symptoms lasted approximately one hour 15 minutes and then resolved completely.  Reassessed patient Alert and orientated x 3 Heart:  RRR Lungs:  CTAB Neuro:  CN II-XII, muscle strength UE and LE 5/5 bilaterally, sensation intact, normal finger to nose testing  Patient unable to go into the MRI after po ativan due to feel claustrophobic.  Therefore, IV ativan ordered for patient.  10:34 AM Reassessed patient.  Patient continues to be asymptomatic.   Alert and orientated x 3 Heart:  RRR Lungs:  CTAB Neuro:  CN II-XII, muscle strength UE and LE 5/5 bilaterally, sensation intact, normal finger to nose testing, some difficulty with word find  Carotid doppler results as follows: Vascular Ultrasound  The left internal carotid artery demonstrates elevated velocities in the 40-59% range, supported by plaque morphology. There is no obvious evidence of hemodynamically significant stenosis of the right internal carotid artery. Vertebral arteries are patent with antegrade flow.  Discussed results with patient.  MRI, MRA, and Echocardiogram pending.  1:45 PM MRI results are as follows: IMPRESSION:  1. Acute / subacute scattered posterior left MCA territory  infarcts.  2. Atrophy and diffuse white matter changes. There is a greater  than expected for age. The finding is nonspecific but can be seen  in the setting of chronic microvascular ischemia, a demyelinating  process such as multiple sclerosis, vasculitis, complicated  migraine headaches, or as the sequelae of a prior infectious or    inflammatory process.  MRA results as follows: IMPRESSION:  1. Asymmetric attenuation of distal left MCA branch vessels. This  corresponds with the area of acute infarction. No significant  proximal stenosis or occlusion is evident.  2. No other significant proximal stenosis, aneurysm, or branch  vessel occlusion.  Results have been discussed with Dr. Oletta Lamas.  2:27 PM Discussed with Dr. Radonna Ricker with [facility] who has agreed to admit the patient.     Pascal Lux [place], PA-C 07/06/12 1930

## 2012-07-06 NOTE — Progress Notes (Signed)
Utilization review completed.  

## 2012-07-06 NOTE — ED Notes (Signed)
Ordered tray for patient per okay from PA.

## 2012-07-06 NOTE — ED Notes (Signed)
Report given and care transferred to Lyn, RN. 

## 2012-07-06 NOTE — Consult Note (Addendum)
[facility] STROKE CONSULT NOTE       Chief Complaint: stroke   HPI:    Corey [name] is an 66 y.o. male who takes ASA and Plavix on a daily basis.  He was watching TV last night when around 10:30 he noted his right arm go numb and speech difficulty.  He was brought to Hospital where initial CT head showed questionable age indeterminate infarct in left caudate and MRI showing left MCA distribution infarct with asymmetric attenuation of distal left MCA branch vessels.  Although patient presented to ED at 11:49 his symptoms rapidly were resolving.  Patient currently shows some intermittent expressive difficulties (improving) per family and right UE pronator drift.   LSN: 10:30 PM 07-05-12 tPA Given: No, rapidly improving symptoms.   Past Medical History  Diagnosis Date  . Hypertension   . Dyslipidemia   . Dyspnea   . Chest pain     Sees Dr. Elease Hashimoto, saw last April 2012  . SOB (shortness of breath)   . GERD (gastroesophageal reflux disease)   . Complication of anesthesia     does have small airway  . PONV (postoperative nausea and vomiting)   . Recurrent upper respiratory infection (URI)     3months ago, treated with claritin D and 5 day dose pack  . Pneumonia     1992  . Sleep apnea     cpap  . Arthritis     back, neck, shoulders  . Coronary artery disease     STATUS POST PTCA AND STENTING OF HIS RIGHT CORONARY ARTERY    Past Surgical History  Procedure Date  . Cardiac catheterization 12/23/2005    NORMAL CHAMBER SIZE AND NORMAL GLOBAL SYSTOLIC FUNCTION. EF 65%  . Cardiac catheterization 12/24/2004    NORMAL LEFT VENTRICULAR SYSTOLIC FUNCTION.THE LEFT VENTRICLE APPEARS TO BE MILDLY ENLARGED.  . Cardiac catheterization 05/27/2003    MILD CARDIOMEGALY. EF 55-60%  . Cervical fusion 07/2001  . Lumbar fusion 10/2003    L5-S1  . Knee arthroscopy 10/2004    LEFT KNEE  . Replacement total knee     left  . Tonsillectomy   . Coronary stent placement    first Aug 2004, 2nd stent 12/2005  . Cardiovascular stress test     2011  . US echocardiography     2011    Family History  Problem Relation Age of Onset  . Hypertension Mother   . Heart disease Father    Social History:  reports that he quit smoking about 9 years ago. His smoking use included Cigarettes. He has a 20 pack-year smoking history. He has never used smokeless tobacco. He reports that he does not drink alcohol or use illicit drugs.  Allergies:  Allergies  Allergen Reactions  . Codeine     Heavy amounts cause nausea  . Monopril (Fosinopril Sodium)     Muscle aches & pain  . Terazosin Hcl     Unknown reaction  . Ampicillin Rash    Medications:    Current Facility-Administered Medications  Medication Dose Route Frequency Provider Last Rate Last Dose  . 0.9 %  sodium chloride infusion  250 mL Intravenous PRN Gwenyth Bender, NP      . aspirin suppository 300 mg  300 mg Rectal Daily Gwenyth Bender, NP       Or  . aspirin tablet 325 mg  325 mg Oral Daily Lesle Chris [place],  NP      . aspirin tablet 325 mg  325 mg Oral Daily Olivia Mackie, MD   325 mg at 07/06/12 1059  . enoxaparin (LOVENOX) injection 40 mg  40 mg Subcutaneous Q24H Olivia Mackie, MD   40 mg at 07/06/12 0129  . LORazepam (ATIVAN) injection 1 mg  1 mg Intravenous Once [employer], PA-C   1 mg at 07/06/12 1136  . LORazepam (ATIVAN) tablet 2 mg  2 mg Oral Once Olivia Mackie, MD   2 mg at 07/06/12 1610  . senna-docusate (Senokot-S) tablet 1 tablet  1 tablet Oral QHS PRN Lesle Chris [place], NP      . sodium chloride 0.9 % injection 3 mL  3 mL Intravenous Q12H Lesle Chris Black, NP      . sodium chloride 0.9 % injection 3 mL  3 mL Intravenous PRN Gwenyth Bender, NP       Current Outpatient Prescriptions  Medication Sig Dispense Refill  . aspirin 325 MG tablet Take 325 mg by mouth daily.        Marland Kitchen atorvastatin (LIPITOR) 40 MG tablet Take 1 tablet (40 mg total) by mouth daily.  30 tablet  5  . clopidogrel (PLAVIX) 75 MG  tablet Take 1 tablet (75 mg total) by mouth daily.  30 tablet  5  . fenofibrate 160 MG tablet TAKE 1 TABLET EVERY DAY  90 tablet  3  . fluticasone (FLONASE) 50 MCG/ACT nasal spray Place 2 sprays into the nose daily.        Marland Kitchen lisinopril (PRINIVIL,ZESTRIL) 20 MG tablet Take 1 tablet (20 mg total) by mouth daily.  30 tablet  5  . metoprolol succinate (TOPROL-XL) 50 MG 24 hr tablet Take 1 tablet (50 mg total) by mouth daily.  90 tablet  4  . Multiple Vitamins-Minerals (MULTIVITAMINS THER. W/MINERALS) TABS Take 1 tablet by mouth daily.        Marland Kitchen omeprazole (PRILOSEC) 10 MG capsule Take 10 mg by mouth daily.      . Tamsulosin HCl (FLOMAX) 0.4 MG CAPS Take 0.4 mg by mouth daily.        . nitroGLYCERIN (NITROSTAT) 0.4 MG SL tablet Place 0.4 mg under the tongue every 5 (five) minutes as needed. For chest pain        ROS: History obtained from the patient  General ROS: negative for - chills, fatigue, fever, night sweats, weight gain or weight loss Psychological ROS: negative for - behavioral disorder, hallucinations, memory difficulties, mood swings or suicidal ideation Ophthalmic ROS: negative for - blurry vision, double vision, eye pain or loss of vision ENT ROS: negative for - epistaxis, nasal discharge, oral lesions, sore throat, tinnitus or vertigo Allergy and Immunology ROS: negative for - hives or itchy/watery eyes Hematological and Lymphatic ROS: negative for - bleeding problems, bruising or swollen lymph nodes Endocrine ROS: negative for - galactorrhea, hair pattern changes, polydipsia/polyuria or temperature intolerance Respiratory ROS: negative for - cough, hemoptysis, shortness of breath or wheezing Cardiovascular ROS: negative for - chest pain, dyspnea on exertion, edema or irregular heartbeat Gastrointestinal ROS: negative for - abdominal pain, diarrhea, hematemesis, nausea/vomiting or stool incontinence Genito-Urinary ROS: negative for - dysuria, hematuria, incontinence or urinary  frequency/urgency Musculoskeletal ROS: negative for - joint swelling or muscular weakness Neurological ROS: as noted in HPI Dermatological ROS: negative for rash and skin lesion changes   Physical Examination: Blood pressure 119/97, pulse 93, temperature 98 F (36.7 C), temperature source Oral, resp.  rate 18, SpO2 96.00%.  Neurologic Examination:   Mental Status: Alert, oriented, thought content appropriate.  Speech fluent without evidence of aphasia.  Able to follow 3 step commands without difficulty. Cranial Nerves: II: Discs flat bilaterally; Visual fields grossly normal, pupils equal, round, reactive to light and accommodation III,IV, VI: extra-ocular motions intact bilaterally V,VII: smile symmetric, facial light touch sensation normal bilaterally VIII: hearing normal bilaterally IX,X: gag reflex present XI: bilateral shoulder shrug XII: midline tongue extension Motor: Right : Upper extremity   5/5    Left:     Upper extremity   5/5  Lower extremity   5/5     Lower extremity   5/5 Right UE pronation drift.  Tone and bulk:normal tone throughout; no atrophy noted Sensory: Pinprick and light touch intact throughout, bilaterally Deep Tendon Reflexes: 2+ and symmetric throughout Plantars: Right: downgoing   Left: downgoing Cerebellar: normal finger-to-nose,  normal heel-to-shin test CV: pulses palpable throughout    Lab Results  Component Value Date/Time   CHOL 104 07/06/2012 12:24 AM    Results for orders placed during the hospital encounter of 07/05/12 (from the past 48 hour(s))  CBC     Status: Normal   Collection Time   07/06/12 12:24 AM      Component Value Range Comment   WBC 5.6  4.0 - 10.5 K/uL    RBC 4.37  4.22 - 5.81 MIL/uL    Hemoglobin 13.2  13.0 - 17.0 g/dL    HCT 16.1  09.6 - 04.5 %    MCV 90.8  78.0 - 100.0 fL    MCH 30.2  26.0 - 34.0 pg    MCHC 33.2  30.0 - 36.0 g/dL    RDW 40.9  81.1 - 91.4 %    Platelets 224  150 - 400 K/uL   COMPREHENSIVE  METABOLIC PANEL     Status: Abnormal   Collection Time   07/06/12 12:24 AM      Component Value Range Comment   Sodium 137  135 - 145 mEq/L    Potassium 4.2  3.5 - 5.1 mEq/L    Chloride 103  96 - 112 mEq/L    CO2 23  19 - 32 mEq/L    Glucose, Bld 124 (*) 70 - 99 mg/dL    BUN 20  6 - 23 mg/dL    Creatinine, Ser 7.82  0.50 - 1.35 mg/dL    Calcium 9.5  8.4 - 95.6 mg/dL    Total Protein 7.0  6.0 - 8.3 g/dL    Albumin 3.9  3.5 - 5.2 g/dL    AST 27  0 - 37 U/L    ALT 27  0 - 53 U/L    Alkaline Phosphatase 67  39 - 117 U/L    Total Bilirubin 0.3  0.3 - 1.2 mg/dL    GFR calc non Af Amer 62 (*) >90 mL/min    GFR calc Af Amer 72 (*) >90 mL/min   PROTIME-INR     Status: Normal   Collection Time   07/06/12 12:24 AM      Component Value Range Comment   Prothrombin Time 12.8  11.6 - 15.2 seconds    INR 0.97  0.00 - 1.49   APTT     Status: Normal   Collection Time   07/06/12 12:24 AM      Component Value Range Comment   aPTT 32  24 - 37 seconds   LIPID PANEL     Status:  Abnormal   Collection Time   07/06/12 12:24 AM      Component Value Range Comment   Cholesterol 104  0 - 200 mg/dL    Triglycerides 409 (*) <150 mg/dL    HDL 25 (*) >81 mg/dL    Total CHOL/HDL Ratio 4.2      VLDL 33  0 - 40 mg/dL    LDL Cholesterol 46  0 - 99 mg/dL   HEMOGLOBIN X9J     Status: Abnormal   Collection Time   07/06/12 12:24 AM      Component Value Range Comment   Hemoglobin A1C 6.0 (*) <5.7 %    Mean Plasma Glucose 126 (*) <117 mg/dL     Ct Head Wo Contrast  07/06/2012  *RADIOLOGY REPORT*  Clinical Data: Right-sided weakness, slurred speech.  CT HEAD WITHOUT CONTRAST  Technique:  Contiguous axial images were obtained from the base of the skull through the vertex without contrast.  Comparison: None.  Findings: Mild periventricular and subcortical white matter changes, nonspecific however often seen in the setting of chronic microangiopathic change.  Tiny hypodensity within the left caudate head as seen on  series 2 image 13.  No mass effect. There is no evidence for acute hemorrhage, hydrocephalus, mass lesion, or abnormal extra-axial fluid collection.  No definite CT evidence for acute cortical based infarction.  The visualized paranasal sinuses and mastoid air cells are predominately clear.  IMPRESSION: Tiny hypodensity within the left caudate head may represent volume averaging or age indeterminate lacunar infarction. No mass effect.  Mild white matter changes as above.  Otherwise, no CT evidence of acute intracranial abnormality. MRI has increased sensitivity for acute ischemia detection if clinical concern persists.   Original Report Authenticated By: Waneta Martins, M.D.    Mr Brain Wo Contrast  07/06/2012  *RADIOLOGY REPORT*  Clinical Data:  Right-sided weakness and slurred speech.  MRI HEAD WITHOUT CONTRAST MRA HEAD WITHOUT CONTRAST  Technique:  Multiplanar, multiecho pulse sequences of the brain and surrounding structures were obtained without intravenous contrast. Angiographic images of the head were obtained using MRA technique without contrast.  Comparison:  CT head without contrast 07/06/2012.  MRI HEAD  Findings:  Scattered foci of restricted effusion are present within the posterior left insular cortex, posterior left frontal and parietal lobes.  These reside within the posterior left MCA distribution.  No hemorrhage or mass lesion is evident.  T2 hyperintensities correspond with the areas of restricted diffusion. No right-sided infarcts are present.  Mild atrophy is present.  There are additional subcortical T2 hyperintensities bilaterally, greater than expected for age.  Flow is present in the major intracranial arteries.  The globes and orbits are intact.  The paranasal sinuses and mastoid air cells are clear.  IMPRESSION:  1.  Acute / subacute scattered posterior left MCA territory infarcts. 2.  Atrophy and diffuse white matter changes.  There is a greater than expected for age. The finding  is nonspecific but can be seen in the setting of chronic microvascular ischemia, a demyelinating process such as multiple sclerosis, vasculitis, complicated migraine headaches, or as the sequelae of a prior infectious or inflammatory process.  MRA HEAD  Findings: The internal carotid arteries demonstrate mild irregularity through the cavernous segment bilaterally.  There is no significant stenosis relative to the distal vessel.  The A1 and M1 segments are normal.  No definite anterior communicating artery is evident.  There is asymmetric attenuation of distal left and three vessels posteriorly, likely contributing to  the areas of infarction.  No significant proximal stenosis or occlusion is evident.  The vertebral arteries are codominant.  The left PICA origin is visualized and within normal limits.  The right AICA is noted.  The basilar artery is within normal limits.  Both posterior cerebral arteries originate from basilar tip.  The PCA branch vessels are within normal limits.  IMPRESSION:  1.  Asymmetric attenuation of distal left MCA branch vessels.  This corresponds with the area of acute infarction.  No significant proximal stenosis or occlusion is evident. 2.  No other significant proximal stenosis, aneurysm, or branch vessel occlusion.  These results were called by telephone on 07/06/2012 at 1 o'clock p.m. to Dr. Oletta Lamas, who verbally acknowledged these results.   Original Report Authenticated By: Jamesetta Orleans. MATTERN, M.D.    Mr Maxine Glenn Head/brain Wo Cm  07/06/2012  *RADIOLOGY REPORT*  Clinical Data:  Right-sided weakness and slurred speech.  MRI HEAD WITHOUT CONTRAST MRA HEAD WITHOUT CONTRAST  Technique:  Multiplanar, multiecho pulse sequences of the brain and surrounding structures were obtained without intravenous contrast. Angiographic images of the head were obtained using MRA technique without contrast.  Comparison:  CT head without contrast 07/06/2012.  MRI HEAD  Findings:  Scattered foci of restricted  effusion are present within the posterior left insular cortex, posterior left frontal and parietal lobes.  These reside within the posterior left MCA distribution.  No hemorrhage or mass lesion is evident.  T2 hyperintensities correspond with the areas of restricted diffusion. No right-sided infarcts are present.  Mild atrophy is present.  There are additional subcortical T2 hyperintensities bilaterally, greater than expected for age.  Flow is present in the major intracranial arteries.  The globes and orbits are intact.  The paranasal sinuses and mastoid air cells are clear.  IMPRESSION:  1.  Acute / subacute scattered posterior left MCA territory infarcts. 2.  Atrophy and diffuse white matter changes.  There is a greater than expected for age. The finding is nonspecific but can be seen in the setting of chronic microvascular ischemia, a demyelinating process such as multiple sclerosis, vasculitis, complicated migraine headaches, or as the sequelae of a prior infectious or inflammatory process.  MRA HEAD  Findings: The internal carotid arteries demonstrate mild irregularity through the cavernous segment bilaterally.  There is no significant stenosis relative to the distal vessel.  The A1 and M1 segments are normal.  No definite anterior communicating artery is evident.  There is asymmetric attenuation of distal left and three vessels posteriorly, likely contributing to the areas of infarction.  No significant proximal stenosis or occlusion is evident.  The vertebral arteries are codominant.  The left PICA origin is visualized and within normal limits.  The right AICA is noted.  The basilar artery is within normal limits.  Both posterior cerebral arteries originate from basilar tip.  The PCA branch vessels are within normal limits.  IMPRESSION:  1.  Asymmetric attenuation of distal left MCA branch vessels.  This corresponds with the area of acute infarction.  No significant proximal stenosis or occlusion is evident.  2.  No other significant proximal stenosis, aneurysm, or branch vessel occlusion.  These results were called by telephone on 07/06/2012 at 1 o'clock p.m. to Dr. Oletta Lamas, who verbally acknowledged these results.   Original Report Authenticated By: Jamesetta Orleans. MATTERN, M.D.    Vascular Ultrasound  Carotid Duplex (Doppler) has been completed.  The left internal carotid artery demonstrates elevated velocities in the 40-59% range, supported by plaque morphology.  There is no obvious evidence of hemodynamically significant stenosis of the right internal carotid artery. Vertebral arteries are patent with antegrade flow.  ECHO: Impressions:  - Normal LV size and systolic function with mild LV hypertrophy. EF 55-60%. Normal RV size and systolic function. No significant valvular abnormalities.   EKG NSR  Assessment:    66 y.o. 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  Exam is normal other than right pronator drift. Looking at distribution of stroke Cannot R/O possibility of cardio embolic versus hypotension in setting of focal stenosis (less likely).   Stroke Risk Factors - hyperlipidemia and hypertension  tpa not given due to rapidly resolving symptoms.   PLAN: 1. HgbA1c, fasting lipid panel 2. TEE--has been called in to [facility] 3. PT consult, OT consult, Speech consult 4. Prophylactic therapy-Antiplatelet med: Plavix - dose 75 mg daily and ASA 81 mg daily 5. Telemetry 6. Risk factor modification  Felicie Morn PA-C [facility] [phone]  07/06/2012, 3:16 PM     I have seen and evaluated the patient. I have reviewed the above note and made appropriate changes.   Ritta Slot, MD [facility] [phone]

## 2012-07-06 NOTE — ED Notes (Signed)
Patient remains in MRI 

## 2012-07-06 NOTE — ED Provider Notes (Signed)
History     CSN: [id number]  Arrival date & time 07/05/12  2340   First MD Initiated Contact with Patient 07/05/12 2350      Chief Complaint  Patient presents with  . Transient Ischemic Attack    (Consider location/radiation/quality/duration/timing/severity/associated sxs/prior treatment) HPI 66 year old male presents to the emergency department with complaint of brief altered mental status.  The patient reports he was in his recliner watching TV around 9:30 when he either fell asleep or passed out. He reports he often falls asleep in his chair around this time. Patient woke around 10:30-1045 and was acutely disoriented, felt like he was outside of his body and felt very uneasy. Patient was unable to work the remote for his TV set, which is highly unusual, reports both himself and his wife. He reports his right arm was numb with a pins and needle sensation from the shoulder all the way down to his fingertips. He denies any weakness or loss of sensation. He reports some blurred vision in his right eye. The symptoms continued, and he went upstairs to talk to his wife. There he noted that he was having difficulties getting words out of his brain. Wife reports he had slurred speech. They debated at home whether to come to the emergency department, and finally decided to come in. During the drive to the emergency department, symptoms slowly resolved. They estimate symptoms lasted about an hour and 15 minutes to an hour and 30 minutes. Patient reports he is back to his baseline at this time with no further symptoms. Patient with past medical history of coronary disease status post 2 stents, hypertension, hyperlipidemia. Patient reports he recently was treated for an upper respiratory illness with a Z-Pak and cough medicine. Finished antibiotics 2 days ago.  Past Medical History  Diagnosis Date  . Hypertension   . Dyslipidemia   . Dyspnea   . Chest pain     Sees Dr. Elease Hashimoto, saw last April 2012  .  SOB (shortness of breath)   . GERD (gastroesophageal reflux disease)   . Complication of anesthesia     does have small airway  . PONV (postoperative nausea and vomiting)   . Recurrent upper respiratory infection (URI)     3months ago, treated with claritin D and 5 day dose pack  . Pneumonia     1992  . Sleep apnea     cpap  . Arthritis     back, neck, shoulders  . Coronary artery disease     STATUS POST PTCA AND STENTING OF HIS RIGHT CORONARY ARTERY    Past Surgical History  Procedure Date  . Cardiac catheterization 12/23/2005    NORMAL CHAMBER SIZE AND NORMAL GLOBAL SYSTOLIC FUNCTION. EF 65%  . Cardiac catheterization 12/24/2004    NORMAL LEFT VENTRICULAR SYSTOLIC FUNCTION.THE LEFT VENTRICLE APPEARS TO BE MILDLY ENLARGED.  . Cardiac catheterization 05/27/2003    MILD CARDIOMEGALY. EF 55-60%  . Cervical fusion 07/2001  . Lumbar fusion 10/2003    L5-S1  . Knee arthroscopy 10/2004    LEFT KNEE  . Replacement total knee     left  . Tonsillectomy   . Coronary stent placement     first Aug 2004, 2nd stent 12/2005  . Cardiovascular stress test     2011  . US echocardiography     2011    Family History  Problem Relation Age of Onset  . Hypertension Mother   . Heart disease Father     History  Substance  Use Topics  . Smoking status: Former Smoker -- 1.0 packs/day for 20 years    Types: Cigarettes    Quit date: 10/04/2002  . Smokeless tobacco: Never Used  . Alcohol Use: No      Review of Systems  All other systems reviewed and are negative.    Allergies  Codeine; Monopril; Terazosin hcl; and Ampicillin  Home Medications   Current Outpatient Rx  Name Route Sig Dispense Refill  . ASPIRIN 325 MG PO TABS Oral Take 325 mg by mouth daily.      . ATORVASTATIN CALCIUM 40 MG PO TABS Oral Take 1 tablet (40 mg total) by mouth daily. 30 tablet 5  . CLONAZEPAM 1 MG PO TABS Oral Take 1 mg by mouth 2 (two) times daily as needed. For anxiety     . CLOPIDOGREL BISULFATE 75  MG PO TABS Oral Take 1 tablet (75 mg total) by mouth daily. 30 tablet 5  . FENOFIBRATE 160 MG PO TABS  TAKE 1 TABLET EVERY DAY 90 tablet 3  . FLUTICASONE PROPIONATE 50 MCG/ACT NA SUSP Nasal Place 2 sprays into the nose daily.      Marland Kitchen LISINOPRIL 20 MG PO TABS Oral Take 1 tablet (20 mg total) by mouth daily. 30 tablet 5  . LORATADINE-PSEUDOEPHEDRINE ER 10-240 MG PO TB24 Oral Take 1 tablet by mouth daily.      Marland Kitchen METOPROLOL SUCCINATE ER 50 MG PO TB24 Oral Take 1 tablet (50 mg total) by mouth daily. 90 tablet 4  . THERA M PLUS PO TABS Oral Take 1 tablet by mouth daily.      Marland Kitchen NITROGLYCERIN 0.4 MG SL SUBL Sublingual Place 0.4 mg under the tongue every 5 (five) minutes as needed. For chest pain    . OMEPRAZOLE 10 MG PO CPDR Oral Take 10 mg by mouth daily.    Marland Kitchen TAMSULOSIN HCL 0.4 MG PO CAPS Oral Take 0.4 mg by mouth daily.        BP 115/73  Pulse 64  Temp 98 F (36.7 C) (Oral)  Resp 15  SpO2 94%  Physical Exam  Nursing note and vitals reviewed. Constitutional: He is oriented to person, place, and time. He appears well-developed and well-nourished.  HENT:  Head: Normocephalic and atraumatic.  Nose: Nose normal.  Mouth/Throat: Oropharynx is clear and moist.  Eyes: Conjunctivae normal and EOM are normal. Pupils are equal, round, and reactive to light.  Neck: Normal range of motion. Neck supple. No JVD present. No tracheal deviation present. No thyromegaly present.  Cardiovascular: Normal rate, regular rhythm, normal heart sounds and intact distal pulses.  Exam reveals no gallop and no friction rub.   No murmur heard. Pulmonary/Chest: Effort normal and breath sounds normal. No stridor. No respiratory distress. He has no wheezes. He has no rales. He exhibits no tenderness.  Abdominal: Soft. Bowel sounds are normal. He exhibits no distension and no mass. There is no tenderness. There is no rebound and no guarding.  Musculoskeletal: Normal range of motion. He exhibits no edema and no tenderness.    Lymphadenopathy:    He has no cervical adenopathy.  Neurological: He is alert and oriented to person, place, and time. He has normal reflexes. No cranial nerve deficit. He exhibits normal muscle tone. Coordination normal.  Skin: Skin is warm and dry. No rash noted. No erythema. No pallor.  Psychiatric: He has a normal mood and affect. His behavior is normal. Judgment and thought content normal.    ED Course  Procedures (  including critical care time)  Labs Reviewed  COMPREHENSIVE METABOLIC PANEL - Abnormal; Notable for the following:    Glucose, Bld 124 (*)     GFR calc non Af Amer 62 (*)     GFR calc Af Amer 72 (*)     All other components within normal limits  LIPID PANEL - Abnormal; Notable for the following:    Triglycerides 165 (*)     HDL 25 (*)     All other components within normal limits  CBC  PROTIME-INR  APTT  URINALYSIS, ROUTINE W REFLEX MICROSCOPIC  HEMOGLOBIN A1C   Ct Head Wo Contrast  07/06/2012  *RADIOLOGY REPORT*  Clinical Data: Right-sided weakness, slurred speech.  CT HEAD WITHOUT CONTRAST  Technique:  Contiguous axial images were obtained from the base of the skull through the vertex without contrast.  Comparison: None.  Findings: Mild periventricular and subcortical white matter changes, nonspecific however often seen in the setting of chronic microangiopathic change.  Tiny hypodensity within the left caudate head as seen on series 2 image 13.  No mass effect. There is no evidence for acute hemorrhage, hydrocephalus, mass lesion, or abnormal extra-axial fluid collection.  No definite CT evidence for acute cortical based infarction.  The visualized paranasal sinuses and mastoid air cells are predominately clear.  IMPRESSION: Tiny hypodensity within the left caudate head may represent volume averaging or age indeterminate lacunar infarction. No mass effect.  Mild white matter changes as above.  Otherwise, no CT evidence of acute intracranial abnormality. MRI has  increased sensitivity for acute ischemia detection if clinical concern persists.   Original Report Authenticated By: Waneta Martins, M.D.     Date: 07/05/2012  Rate:72  Rhythm: normal sinus rhythm  QRS Axis: normal  Intervals: normal  ST/T Wave abnormalities: normal  Conduction Disutrbances:none  Narrative Interpretation:   Old EKG Reviewed: unchanged    1. TIA (transient ischemic attack)       MDM  66 year old male with what sounds to be a TIA. His ABCD2. score is 5. Will place on TIA protocol. Patient updated on findings and plan. He is comfortable with staying overnight and having further workup done.        Olivia Mackie, MD 07/06/12 [phone]

## 2012-07-06 NOTE — ED Notes (Signed)
Patient transported to CT 

## 2012-07-07 ENCOUNTER — Encounter (HOSPITAL_COMMUNITY)
Admission: EM | Disposition: A | Discharge status: Home / Self Care | Payer: Self-pay | Source: Home / Self Care | Attending: Internal Medicine

## 2012-07-07 ENCOUNTER — Other Ambulatory Visit: Payer: Self-pay | Admitting: *Deleted

## 2012-07-07 ENCOUNTER — Encounter (HOSPITAL_COMMUNITY): Payer: Self-pay | Admitting: *Deleted

## 2012-07-07 DIAGNOSIS — I2510 Atherosclerotic heart disease of native coronary artery without angina pectoris: Secondary | ICD-10-CM

## 2012-07-07 DIAGNOSIS — I6789 Other cerebrovascular disease: Secondary | ICD-10-CM

## 2012-07-07 DIAGNOSIS — E785 Hyperlipidemia, unspecified: Secondary | ICD-10-CM

## 2012-07-07 HISTORY — PX: TEE WITHOUT CARDIOVERSION: SHX5443

## 2012-07-07 LAB — LIPID PANEL
Cholesterol: 107 mg/dL (ref 0–200)
HDL: 25 mg/dL — ABNORMAL LOW (ref >39)
LDL Cholesterol: 47 mg/dL (ref 0–99)
Total CHOL/HDL Ratio: 4.3 RATIO
Triglycerides: 176 mg/dL — ABNORMAL HIGH (ref <150)
VLDL: 35 mg/dL (ref 0–40)

## 2012-07-07 LAB — HEMOGLOBIN A1C
Hgb A1c MFr Bld: 5.9 % — ABNORMAL HIGH (ref <5.7)
Mean Plasma Glucose: 123 mg/dL — ABNORMAL HIGH (ref <117)

## 2012-07-07 SURGERY — ECHOCARDIOGRAM, TRANSESOPHAGEAL
Anesthesia: Moderate Sedation

## 2012-07-07 MED ORDER — MIDAZOLAM HCL 5 MG/ML IJ SOLN
INTRAMUSCULAR | Status: AC
  Filled 2012-07-07: qty 1

## 2012-07-07 MED ORDER — MIDAZOLAM HCL 10 MG/2ML IJ SOLN
INTRAMUSCULAR | Status: DC | PRN
  Administered 2012-07-07 (×2): 2 mg via INTRAVENOUS

## 2012-07-07 MED ORDER — FENTANYL CITRATE 0.05 MG/ML IJ SOLN
INTRAMUSCULAR | Status: DC | PRN
  Administered 2012-07-07: 25 ug via INTRAVENOUS

## 2012-07-07 MED ORDER — FENTANYL CITRATE 0.05 MG/ML IJ SOLN
INTRAMUSCULAR | Status: AC
  Filled 2012-07-07: qty 2

## 2012-07-07 MED ORDER — LIDOCAINE VISCOUS 2 % MT SOLN
OROMUCOSAL | Status: AC
  Filled 2012-07-07: qty 15

## 2012-07-07 MED ORDER — LIDOCAINE VISCOUS 2 % MT SOLN
OROMUCOSAL | Status: DC | PRN
  Administered 2012-07-07: 10 mL via OROMUCOSAL

## 2012-07-07 MED ORDER — ASPIRIN EC 81 MG PO TBEC
81.0000 mg | DELAYED_RELEASE_TABLET | Freq: Every day | ORAL | Status: DC
Start: 2012-07-08 — End: 2012-07-07

## 2012-07-07 MED ORDER — ACETAMINOPHEN 325 MG PO TABS
650.0000 mg | ORAL_TABLET | Freq: Four times a day (QID) | ORAL | Status: DC | PRN
  Administered 2012-07-07: 650 mg via ORAL
  Filled 2012-07-07: qty 2

## 2012-07-07 MED ORDER — OMEPRAZOLE 10 MG PO CPDR: 10.0000 mg | DELAYED_RELEASE_CAPSULE | Freq: Every day | ORAL | Status: DC

## 2012-07-07 NOTE — Evaluation (Signed)
Physical Therapy Evaluation Patient Details Name: Corey [name] MRN: [id number] DOB: [birth year] Today's Date: 07/07/2012 Time: 4098-1191 PT Time Calculation (min): 24 min  PT Assessment / Plan / Recommendation Clinical Impression  66 y.o. male presents to [facility] with symptoms of a left sided stroke with R arm numbness, tingling, weakness and speech difficulties.  Symptoms seem to be resolving as he did well on his balance, gait and lower extremity strength assessment.  We spoke at length about the risk of a future stroke and the importance of getting to the hospital quickly if he has any of the signs and symptoms of a stroke (we reviewed all the possible signs and symptoms).  He has no acute and currently no f/u PT needed at this time.      PT Assessment  Patent does not need any further PT services    Follow Up Recommendations  No PT follow up    Barriers to Discharge  none      Equipment Recommendations  None recommended by PT    Recommendations for Other Services   none     Precautions / Restrictions   none  Pertinent Vitals/Pain No reports of pain.        Mobility  Transfers Transfers: Sit to Stand;Stand to Sit Sit to Stand: 7: Independent Stand to Sit: 7: Independent Ambulation/Gait Ambulation/Gait Assistance: 7: Independent Ambulation Distance (Feet): 25 Feet Assistive device: None Gait Pattern: Step-through pattern Modified Rankin (Stroke Patients Only) Pre-Morbid Rankin Score: No symptoms Modified Rankin: No significant disability           Visit Information  Last PT Received On: 07/07/12 Assistance Needed: +1    Subjective Data  Subjective: Pt's wife reports that he is still having some mild speech difficulties (word finding and some word mix ups).  Pt reports that he feels back to his normal self.  H/o R shoulder surgery could affect strength in his right shoulder.  H/o L TKA as well and lumbar fusions.   Patient Stated Goal: to go home, be able to drive and  go to the beach in 2 weeks.     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, engineering) Comments: did his own yard work PTA Communication Communication: No difficulties (no diificulties that I saw)    Cognition  Overall Cognitive Status: Appears within functional limits for tasks assessed/performed    Extremity/Trunk Assessment Right Lower Extremity Assessment RLE ROM/Strength/Tone: Within functional levels Left Lower Extremity Assessment LLE ROM/Strength/Tone: Within functional levels   Balance Balance Balance Assessed: Yes Berg Balance Test Sit to Stand: Able to stand without using hands and stabilize independently Standing Unsupported: Able to stand safely 2 minutes Sitting with Back Unsupported but Feet Supported on Floor or Stool: Able to sit safely and securely 2 minutes Stand to Sit: Sits safely with minimal use of hands Transfers: Able to transfer safely, minor use of hands Standing Unsupported with Eyes Closed: Able to stand 10 seconds safely Standing Ubsupported with Feet Together: Able to place feet together independently and stand 1 minute safely From Standing, Reach Forward with Outstretched Arm: Can reach confidently >25 cm (10") From Standing Position, Pick up Object from Floor: Able to pick up shoe safely and easily From Standing Position, Turn to Look Behind Over each Shoulder: Looks behind from both sides and weight shifts well Turn 360 Degrees: Able to turn  360 degrees safely in 4 seconds or less Standing Unsupported, Alternately Place Feet on Step/Stool: Able to stand independently and safely and complete 8 steps in 20 seconds Standing Unsupported, One Foot in Front: Able to plae foot ahead of the other independently and hold 30 seconds Standing on One Leg: Able to lift leg independently and  hold 5-10 seconds Total Score: 54   End of Session PT - End of Session Activity Tolerance: Patient tolerated treatment well Patient left: in bed;with call bell/phone within reach;with family/visitor present (seated EOB)  GP Functional Assessment Tool Used: Berg Balance Scale Functional Limitation: Mobility: Walking and moving around Mobility: Walking and Moving Around Current Status [phone]): At least 1 percent but less than 20 percent impaired, limited or restricted Mobility: Walking and Moving Around Goal Status [phone]): At least 1 percent but less than 20 percent impaired, limited or restricted Mobility: Walking and Moving Around Discharge Status [phone]): At least 1 percent but less than 20 percent impaired, limited or restricted   Leann Mayweather B. Noe Goyer, PT, DPT [phone]   07/07/2012, 9:05 AM

## 2012-07-07 NOTE — Evaluation (Signed)
Speech Language Pathology Evaluation Patient Details Name: Corey [name] MRN: [id number] DOB: 26-Feb-1946 Today's Date: 07/07/2012 Time: 4098-1191 SLP Time Calculation (min): 23 min  Problem List:  Patient Active Problem List  Diagnosis  . Coronary artery disease  . Hypertension  . Dyslipidemia  . Dyspnea  . Chest pain  . SOB (shortness of breath)  . GERD (gastroesophageal reflux disease)  . CVA (cerebral infarction)   Past Medical History:  Past Medical History  Diagnosis Date  . Hypertension   . Dyslipidemia   . Dyspnea   . Chest pain     Sees Dr. Elease Hashimoto, saw last April 2012  . SOB (shortness of breath)   . GERD (gastroesophageal reflux disease)   . Complication of anesthesia     does have small airway  . PONV (postoperative nausea and vomiting)   . Recurrent upper respiratory infection (URI)     3months ago, treated with claritin D and 5 day dose pack  . Pneumonia     1992  . Sleep apnea     cpap  . Arthritis     back, neck, shoulders  . Coronary artery disease     STATUS POST PTCA AND STENTING OF HIS RIGHT CORONARY ARTERY   Past Surgical History:  Past Surgical History  Procedure Date  . Cardiac catheterization 12/23/2005    NORMAL CHAMBER SIZE AND NORMAL GLOBAL SYSTOLIC FUNCTION. EF 65%  . Cardiac catheterization 12/24/2004    NORMAL LEFT VENTRICULAR SYSTOLIC FUNCTION.THE LEFT VENTRICLE APPEARS TO BE MILDLY ENLARGED.  . Cardiac catheterization 05/27/2003    MILD CARDIOMEGALY. EF 55-60%  . Cervical fusion 07/2001  . Lumbar fusion 10/2003    L5-S1  . Knee arthroscopy 10/2004    LEFT KNEE  . Replacement total knee     left  . Tonsillectomy   . Coronary stent placement     first Aug 2004, 2nd stent 12/2005  . Cardiovascular stress test     2011  . US echocardiography     2011   HPI:  Corey [name] is a 66 y.o. male male presents to the emergency department on 07/05/12 late evening with complaint of brief altered mental status. The patient reports he was in his  recliner watching TV around 9:30 when he either fell asleep or passed out.  Patient woke around 10:30-1045 and was acutely disoriented, felt like he was outside of his body and felt very uneasy. Patient was unable to work the remote for his TV set, which is highly unusual, reports both himself and his wife. He reports his right arm was numb with a pins and needle sensation from the shoulder all the way down to his fingertips. He denies any weakness or loss of sensation. He reports some blurred vision in his right eye. The symptoms continued, and he went upstairs to talk to his wife. There he noted that he was having difficulties getting words out. Wife reports he had slurred speech. They debated at home whether to come to the emergency department, and finally decided to come in. During the drive to the emergency department, symptoms slowly resolved. They estimate symptoms lasted about an hour and 15 minutes to an hour and 30 minutes. He stayed in the ED all night and had complete stroke workup. MRI/MRA confirmed infarct. Remains with slightly slurred speech, some word searching and confusion. Patient with past medical history of coronary disease status post 2 stents, hypertension, hyperlipidemia. Patient reports he recently was treated for an upper respiratory illness  with a Z-Pak and cough medicine. Finished antibiotics 2 days ago. [facility] hospitalist asked to admit   Assessment / Plan / Recommendation Clinical Impression  Pt presents with mild receptive/expressive aphasia with deficits noted during complex linguistic comprehension and expression tasks, as well as with higher-level writing tasks.  Aphasia has improved during time since admission, however pt feels he would benefit from an OPSLP assessment to ensure full recovery of complex language expression/reception.     SLP Assessment  All further Speech Language Pathology  needs can be addressed in the next venue of care    Follow Up Recommendations   Outpatient SLP    Frequency and Duration     n/a   Pertinent Vitals/Pain No pain   SLP Goals     SLP Evaluation Prior Functioning  Cognitive/Linguistic Baseline: Within functional limits Lives With: Spouse Vocation: Part time employment   Cognition  Overall Cognitive Status: Appears within functional limits for tasks assessed Arousal/Alertness: Awake/alert    Comprehension  Auditory Comprehension Overall Auditory Comprehension: Impaired Yes/No Questions: Within Functional Limits Commands: Within Functional Limits Conversation: Complex Other Conversation Comments: decreased comprehension complex conversation and multistep commands Visual Recognition/Discrimination Discrimination: Within Function Limits    Expression Expression Primary Mode of Expression: Verbal Verbal Expression Overall Verbal Expression: Impaired Initiation: No impairment Level of Generative/Spontaneous Verbalization: Conversation (impaired higher-level word retrieval) Naming: Impairment Divergent: 50-74% accurate Verbal Errors: Aware of errors Pragmatics: No impairment Written Expression Dominant Hand: Right Written Expression: Exceptions to [facility] Dictation Ability: Sentence (impaired) Self Formulation Ability: Phrase (impaired)   Oral / Motor Oral Motor/Sensory Function Overall Oral Motor/Sensory Function: Appears within functional limits for tasks assessed Motor Speech Overall Motor Speech: Appears within functional limits for tasks assessed   Corey [name] L. Samson Frederic, [place] CCC/SLP Pager [phone]      Blenda Mounts Laurice 07/07/2012, 4:04 PM

## 2012-07-07 NOTE — H&P (Signed)
Referring physician: Ghimm  PCP: Darrow Bussing, MD  Specialists: nasher  Chief Complaint: right arm numbness/slurred speech/confusion  HPI: Corey [name] is a 66 y.o. male male presents to the emergency department on 07/05/12 late evening with complaint of brief altered mental status. The patient reports he was in his recliner watching TV around 9:30 when he either fell asleep or passed out. Patient woke around 10:30-1045 and was acutely disoriented, felt like he was outside of his body and felt very uneasy. Patient was unable to work the remote for his TV set, which is highly unusual, reports both himself and his wife. He reports his right arm was numb with a pins and needle sensation from the shoulder all the way down to his fingertips. He denies any weakness or loss of sensation. He reports some blurred vision in his right eye. The symptoms continued, and he went upstairs to talk to his wife. There he noted that he was having difficulties getting words out. Wife reports he had slurred speech. They debated at home whether to come to the emergency department, and finally decided to come in. During the drive to the emergency department, symptoms slowly resolved. They estimate symptoms lasted about an hour and 15 minutes to an hour and 30 minutes. He stayed in the ED all night and had complete stroke workup. MRI/MRA confirmed infarct. Remains with slightly slurred speech, some word searching and confusion. Patient with past medical history of coronary disease status post 2 stents, hypertension, hyperlipidemia. Patient reports he recently was treated for an upper respiratory illness with a Z-Pak and cough medicine. Finished antibiotics 2 days ago. [facility] hospitalist asked to admit  Review of Systems: The patient denies anorexia, fever, weight loss,, vision loss, decreased hearing, hoarseness, chest pain, , dyspnea on exertion, peripheral edema, balance deficits, hemoptysis, abdominal pain, melena, hematochezia,  severe indigestion/heartburn, hematuria, incontinence, genital sores, muscle weakness, suspicious skin lesions, transient blindness, difficulty walking, depression, unusual weight change, abnormal bleeding, enlarged lymph nodes, angioedema, and breast masses.  Past Medical History   Diagnosis  Date   .  Hypertension    .  Dyslipidemia    .  Dyspnea    .  Chest pain      Sees Dr. Elease Hashimoto, saw last April 2012   .  SOB (shortness of breath)    .  GERD (gastroesophageal reflux disease)    .  Complication of anesthesia      does have small airway   .  PONV (postoperative nausea and vomiting)    .  Recurrent upper respiratory infection (URI)      3months ago, treated with claritin D and 5 day dose pack   .  Pneumonia      1992   .  Sleep apnea      cpap   .  Arthritis      back, neck, shoulders   .  Coronary artery disease      STATUS POST PTCA AND STENTING OF HIS RIGHT CORONARY ARTERY    Past Surgical History   Procedure  Date   .  Cardiac catheterization  12/23/2005     NORMAL CHAMBER SIZE AND NORMAL GLOBAL SYSTOLIC FUNCTION. EF 65%   .  Cardiac catheterization  12/24/2004     NORMAL LEFT VENTRICULAR SYSTOLIC FUNCTION.THE LEFT VENTRICLE APPEARS TO BE MILDLY ENLARGED.   .  Cardiac catheterization  05/27/2003     MILD CARDIOMEGALY. EF 55-60%   .  Cervical fusion  07/2001   .  Lumbar fusion  10/2003     L5-S1   .  Knee arthroscopy  10/2004     LEFT KNEE   .  Replacement total knee      left   .  Tonsillectomy    .  Coronary stent placement      first Aug 2004, 2nd stent 12/2005   .  Cardiovascular stress test      2011   .  US echocardiography      2011    Social History: reports that he quit smoking about 9 years ago. His smoking use included Cigarettes. He has a 20 pack-year smoking history. He has never used smokeless tobacco. He reports that he does not drink alcohol or use illicit drugs.  Lives at home with wife of 29 years. Has 3 children. Semi retired. Independent with ADL's    Allergies   Allergen  Reactions   .  Codeine      Heavy amounts cause nausea   .  Monopril (Fosinopril Sodium)      Muscle aches & pain   .  Terazosin Hcl      Unknown reaction   .  Ampicillin  Rash    Family History   Problem  Relation  Age of Onset   .  Hypertension  Mother    .  Heart disease  Father     Prior to Admission medications   Medication  Sig  Start Date  End Date  Taking?  Authorizing Provider   aspirin 325 MG tablet  Take 325 mg by mouth daily.    Yes  Historical Provider, MD   atorvastatin (LIPITOR) 40 MG tablet  Take 1 tablet (40 mg total) by mouth daily.  02/08/12   Yes  Vesta Mixer, MD   clopidogrel (PLAVIX) 75 MG tablet  Take 1 tablet (75 mg total) by mouth daily.  03/21/12   Yes  Vesta Mixer, MD   fenofibrate 160 MG tablet  TAKE 1 TABLET EVERY DAY  06/20/12   Yes  Vesta Mixer, MD   fluticasone [facility]) 50 MCG/ACT nasal spray  Place 2 sprays into the nose daily.    Yes  Historical Provider, MD   lisinopril (PRINIVIL,ZESTRIL) 20 MG tablet  Take 1 tablet (20 mg total) by mouth daily.  02/08/12   Yes  Vesta Mixer, MD   metoprolol succinate (TOPROL-XL) 50 MG 24 hr tablet  Take 1 tablet (50 mg total) by mouth daily.  02/07/12   Yes  Vesta Mixer, MD   Multiple Vitamins-Minerals (MULTIVITAMINS THER. W/MINERALS) TABS  Take 1 tablet by mouth daily.    Yes  Historical Provider, MD   omeprazole (PRILOSEC) 10 MG capsule  Take 10 mg by mouth daily.    Yes  Historical Provider, MD   Tamsulosin HCl (FLOMAX) 0.4 MG CAPS  Take 0.4 mg by mouth daily.    Yes  Historical Provider, MD   nitroGLYCERIN (NITROSTAT) 0.4 MG SL tablet  Place 0.4 mg under the tongue every 5 (five) minutes as needed. For chest pain     Historical Provider, MD   Physical Exam:  Filed Vitals:    07/06/12 1130  07/06/12 1315  07/06/12 1330  07/06/12 1413   BP:  106/47  122/64  119/97  119/97   Pulse:  62  74  72  93   Temp:       TempSrc:       Resp:  SpO2:  94%  94%  96%  96%     General: Awake alert well nourished NAD  Eyes: PERRL EOMI no scleral icterus  ENT: mucus membranes mouth pink/only slightly dry, ears clear, nose without drainage  Neck: supple, no JVD full rom  Cardiovascular: RRR No MGR No LEE PPP  Respiratory: normal effort, BSCTAB No wheeze, no rhonchi  Abdomen: obese, soft, +BS, non-tender to palpation  Skin: warm/dry/no rash or lesion noted  Musculoskeletal: MOE. No joint swelling/erythema. Non tender to palpation  Psychiatric: appropriate, cooperative  Neurologic: AOx3. Speech slightly slurred, some word searching, bilateral LE strength 5. Rt upper extremity slightly weaker left 4/5. Sensation intact Labs on Admission:  Basic Metabolic Panel:   Lab  07/06/12 0024   NA  137   K  4.2   CL  103   CO2  23   GLUCOSE  124*   BUN  20   CREATININE  1.20   CALCIUM  9.5   MG  --   PHOS  --    Liver Function Tests:   Lab  07/06/12 0024   AST  27   ALT  27   ALKPHOS  67   BILITOT  0.3   PROT  7.0   ALBUMIN  3.9    No results found for this basename: LIPASE:5,AMYLASE:5 in the last 168 hours  No results found for this basename: AMMONIA:5 in the last 168 hours  CBC:   Lab  07/06/12 0024   WBC  5.6   NEUTROABS  --   HGB  13.2   HCT  39.7   MCV  90.8   PLT  224    Cardiac Enzymes:  No results found for this basename: CKTOTAL:5,CKMB:5,CKMBINDEX:5,TROPONINI:5 in the last 168 hours  BNP (last 3 results)  No results found for this basename: PROBNP:3 in the last 8760 hours  CBG:  No results found for this basename: GLUCAP:5 in the last 168 hours  Radiological Exams on Admission:  Ct Head Wo Contrast  07/06/2012 *RADIOLOGY REPORT* Clinical Data: Right-sided weakness, slurred speech. CT HEAD WITHOUT CONTRAST Technique: Contiguous axial images were obtained from the base of the skull through the vertex without contrast. Comparison: None. Findings: Mild periventricular and subcortical white matter changes, nonspecific however often seen in the  setting of chronic microangiopathic change. Tiny hypodensity within the left caudate head as seen on series 2 image 13. No mass effect. There is no evidence for acute hemorrhage, hydrocephalus, mass lesion, or abnormal extra-axial fluid collection. No definite CT evidence for acute cortical based infarction. The visualized paranasal sinuses and mastoid air cells are predominately clear. IMPRESSION: Tiny hypodensity within the left caudate head may represent volume averaging or age indeterminate lacunar infarction. No mass effect. Mild white matter changes as above. Otherwise, no CT evidence of acute intracranial abnormality. MRI has increased sensitivity for acute ischemia detection if clinical concern persists. Original Report Authenticated By: Waneta Martins, M.D.  Mr Brain Wo Contrast  07/06/2012 *RADIOLOGY REPORT* Clinical Data: Right-sided weakness and slurred speech. MRI HEAD WITHOUT CONTRAST MRA HEAD WITHOUT CONTRAST Technique: Multiplanar, multiecho pulse sequences of the brain and surrounding structures were obtained without intravenous contrast. Angiographic images of the head were obtained using MRA technique without contrast. Comparison: CT head without contrast 07/06/2012. MRI HEAD Findings: Scattered foci of restricted effusion are present within the posterior left insular cortex, posterior left frontal and parietal lobes. These reside within the posterior left MCA distribution. No hemorrhage or mass lesion is evident.  T2 hyperintensities correspond with the areas of restricted diffusion. No right-sided infarcts are present. Mild atrophy is present. There are additional subcortical T2 hyperintensities bilaterally, greater than expected for age. Flow is present in the major intracranial arteries. The globes and orbits are intact. The paranasal sinuses and mastoid air cells are clear. IMPRESSION: 1. Acute / subacute scattered posterior left MCA territory infarcts. 2. Atrophy and diffuse white matter  changes. There is a greater than expected for age. The finding is nonspecific but can be seen in the setting of chronic microvascular ischemia, a demyelinating process such as multiple sclerosis, vasculitis, complicated migraine headaches, or as the sequelae of a prior infectious or inflammatory process. MRA HEAD Findings: The internal carotid arteries demonstrate mild irregularity through the cavernous segment bilaterally. There is no significant stenosis relative to the distal vessel. The A1 and M1 segments are normal. No definite anterior communicating artery is evident. There is asymmetric attenuation of distal left and three vessels posteriorly, likely contributing to the areas of infarction. No significant proximal stenosis or occlusion is evident. The vertebral arteries are codominant. The left PICA origin is visualized and within normal limits. The right AICA is noted. The basilar artery is within normal limits. Both posterior cerebral arteries originate from basilar tip. The PCA branch vessels are within normal limits. IMPRESSION: 1. Asymmetric attenuation of distal left MCA branch vessels. This corresponds with the area of acute infarction. No significant proximal stenosis or occlusion is evident. 2. No other significant proximal stenosis, aneurysm, or branch vessel occlusion. These results were called by telephone on 07/06/2012 at 1 o'clock p.m. to Dr. Oletta Lamas, who verbally acknowledged these results. Original Report Authenticated By: Jamesetta Orleans. MATTERN, M.D.  Mr Maxine Glenn Head/brain Wo Cm  07/06/2012 *RADIOLOGY REPORT* Clinical Data: Right-sided weakness and slurred speech. MRI HEAD WITHOUT CONTRAST MRA HEAD WITHOUT CONTRAST Technique: Multiplanar, multiecho pulse sequences of the brain and surrounding structures were obtained without intravenous contrast. Angiographic images of the head were obtained using MRA technique without contrast. Comparison: CT head without contrast 07/06/2012. MRI HEAD Findings:  Scattered foci of restricted effusion are present within the posterior left insular cortex, posterior left frontal and parietal lobes. These reside within the posterior left MCA distribution. No hemorrhage or mass lesion is evident. T2 hyperintensities correspond with the areas of restricted diffusion. No right-sided infarcts are present. Mild atrophy is present. There are additional subcortical T2 hyperintensities bilaterally, greater than expected for age. Flow is present in the major intracranial arteries. The globes and orbits are intact. The paranasal sinuses and mastoid air cells are clear. IMPRESSION: 1. Acute / subacute scattered posterior left MCA territory infarcts. 2. Atrophy and diffuse white matter changes. There is a greater than expected for age. The finding is nonspecific but can be seen in the setting of chronic microvascular ischemia, a demyelinating process such as multiple sclerosis, vasculitis, complicated migraine headaches, or as the sequelae of a prior infectious or inflammatory process. MRA HEAD Findings: The internal carotid arteries demonstrate mild irregularity through the cavernous segment bilaterally. There is no significant stenosis relative to the distal vessel. The A1 and M1 segments are normal. No definite anterior communicating artery is evident. There is asymmetric attenuation of distal left and three vessels posteriorly, likely contributing to the areas of infarction. No significant proximal stenosis or occlusion is evident. The vertebral arteries are codominant. The left PICA origin is visualized and within normal limits. The right AICA is noted. The basilar artery is within normal limits. Both posterior cerebral arteries originate  from basilar tip. The PCA branch vessels are within normal limits. IMPRESSION: 1. Asymmetric attenuation of distal left MCA branch vessels. This corresponds with the area of acute infarction. No significant proximal stenosis or occlusion is evident.  2. No other significant proximal stenosis, aneurysm, or branch vessel occlusion. These results were called by telephone on 07/06/2012 at 1 o'clock p.m. to Dr. Oletta Lamas, who verbally acknowledged these results. Original Report Authenticated By: Jamesetta Orleans. MATTERN, M.D.   EKG: Independently reviewed.  Assessment/Plan  Principal Problem:  CVA (cerebral infarction) will admit to tele. Workup yields MRI showing acute / subacute scattered posterior left MCA territory . Echo mild LVH, 60% EF grade 1 diastolic dysfunction, carotid dop yields right internal carotid artery has no evidence of hemodynamically significant stenosis. The left internal carotid artery demonstrates elevated velocities in the 40-59% range.Vertebrals are patent with antegrade flow. Triglycerides 165, HDL 25. Will continue home ASA and plavix. Will monitor tele for 24 hours. Will request neuro consult. Request PT/OT  Active Problems:  Coronary artery disease: S/P PTCA with stent. Dr. Melburn Popper has on plavix and asa. No chest pain. Will continue home meds  Hypertension: controlled. Will continue home meds with parameters  Dyslipidemia: continue statin  GERD (gastroesophageal reflux disease): stable at baseline. Continue PPI  Code Status: full  Family Communication: pt and wife and sons at bedside  Disposition Plan: Home when medically stable. Hopefully 24 hours  Time spent: 35 minutes  Gwenyth Bender NP  [facility]   Patient seen and examined.  Case reviewed.  Patient is an adequate candidate for TEE to r/o source of embolism with CVA

## 2012-07-07 NOTE — Telephone Encounter (Signed)
Fax Received. Refill Completed. Corey [name] (R.M.A)   

## 2012-07-07 NOTE — Interval H&P Note (Signed)
History and Physical Interval Note:  07/07/2012 10:20 AM  Corey [name]  has presented today for surgery, with the diagnosis of stroke   The various methods of treatment have been discussed with the patient and family. After consideration of risks, benefits and other options for treatment, the patient has consented to  Procedure(s) (LRB) with comments: TRANSESOPHAGEAL ECHOCARDIOGRAM (TEE) (N/A) as a surgical intervention .  The patient's history has been reviewed, patient examined, no change in status, stable for surgery.  I have reviewed the patient's chart and labs.  Questions were answered to the patient's satisfaction.     Dietrich Pates

## 2012-07-07 NOTE — Progress Notes (Signed)
SLP Cancellation Note  Evaluation cancelled today due to patient receiving procedure or test.  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  They report that patient has a low level of frustration tolerance premorbidly, and expressed concern re: how he will cope with these changes.  SLP answered questions and provided suggestions for dealing with the above.  Plan:  As patinet is having a TEE today (with sedation) it is recommended that the SLE be deferred until 10/5. Per sons' descriptions of current level of function, patient will likely benefit from OP speech therapy (to be determined after Speech Evaluation is complete).  Maryjo Rochester T 07/07/2012, 11:09 AM

## 2012-07-07 NOTE — H&P (View-Only) (Signed)
SLP Cancellation Note  Speech-language evaluation cancelled at this time due to patient receiving procedure or test.  Will return today if schedule allows, otherwise will f/u 10/5.  Esgar Barnick T 07/07/2012, 9:43 AM   

## 2012-07-07 NOTE — Discharge Summary (Addendum)
Physician Discharge Summary  Corey [name]:[id number] DOB: 1946/08/04 DOA: 07/05/2012  PCP: Darrow Bussing, MD  Admit date: 07/05/2012 Discharge date: 07/07/2012  Recommendations for Outpatient Follow-up:  1. Outpatient speech therapy 2. Holter monitor through [facility] cardiology  Discharge Diagnoses:  Principal Problem:  *CVA (cerebral infarction) Active Problems:  Coronary artery disease  Hypertension  Dyslipidemia  GERD (gastroesophageal reflux disease)   Discharge Condition: improved  Diet recommendation: cardiac  Filed Weights   07/06/12 1900  Weight: 116.574 kg (257 lb)    History of present illness:  Corey [name] is a 66 y.o. male male presents to the emergency department on 07/05/12 late evening with complaint of brief altered mental status. The patient reports he was in his recliner watching TV around 9:30 when he either fell asleep or passed out. Patient woke around 10:30-1045 and was acutely disoriented, felt like he was outside of his body and felt very uneasy. Patient was unable to work the remote for his TV set, which is highly unusual, reports both himself and his wife. He reports his right arm was numb with a pins and needle sensation from the shoulder all the way down to his fingertips. He denies any weakness or loss of sensation. He reports some blurred vision in his right eye. The symptoms continued, and he went upstairs to talk to his wife. There he noted that he was having difficulties getting words out. Wife reports he had slurred speech. They debated at home whether to come to the emergency department, and finally decided to come in. During the drive to the emergency department, symptoms slowly resolved. They estimate symptoms lasted about an hour and 15 minutes to an hour and 30 minutes. He stayed in the ED all night and had complete stroke workup. MRI/MRA confirmed infarct. Remains with slightly slurred speech, some word searching and confusion. Patient with past  medical history of coronary disease status post 2 stents, hypertension, hyperlipidemia. Patient reports he recently was treated for an upper respiratory illness with a Z-Pak and cough medicine. Finished antibiotics 2 days ago. [facility] hospitalist asked to admit   Hospital Course:  CVA found on MRI: TEE negative for blood clot and {PFO.  Continue aspirin 325 mg orally every day and clopidogrel 75 mg orally every day for secondary stroke prevention.  outpatient TCD bubble study with emboli monitoring with Dr. Pearlean Brownie in 1 month to further evaluate PFO. Have patient call for appointment.   Please schedule outpatient telemetry monitoring to assess patient for atrial fibrillation as source of stroke. Called Trish from [place] to arrange.     Procedures:  TEE  Consultations:  cardiology  Discharge Exam: Filed Vitals:   07/07/12 1100 07/07/12 1110 07/07/12 1117 07/07/12 1444  BP: 126/81 142/81 140/73 123/80  Pulse:    82  Temp:    97.9 F (36.6 C)  TempSrc:    Oral  Resp: 17 14 18 18   Height:      Weight:      SpO2:  93% 93% 94%    General: A+Ox3, NAD Cardiovascular: rrr Respiratory: clear anterior Skin: no rashes or lesions  Discharge Instructions      Discharge Orders    Future Orders Please Complete By Expires   Diet - low sodium heart healthy      Increase activity slowly      Discharge instructions      Comments:   Follow up dr. Pearlean Brownie for bubble study  Cardiology for heart monitor   Driving Restrictions  Comments:   No driving until seen by Dr. Pearlean Brownie       Medication List     As of 07/07/2012  5:27 PM    TAKE these medications         aspirin 325 MG tablet   Take 325 mg by mouth daily.      atorvastatin 40 MG tablet   Commonly known as: LIPITOR   Take 1 tablet (40 mg total) by mouth daily.      clopidogrel 75 MG tablet   Commonly known as: PLAVIX   Take 1 tablet (75 mg total) by mouth daily.      fenofibrate 160 MG tablet   TAKE 1 TABLET EVERY DAY       FLOMAX 0.4 MG Caps   Generic drug: Tamsulosin HCl   Take 0.4 mg by mouth daily.      fluticasone 50 MCG/ACT nasal spray   Commonly known as: FLONASE   Place 2 sprays into the nose daily.      lisinopril 20 MG tablet   Commonly known as: PRINIVIL,ZESTRIL   Take 1 tablet (20 mg total) by mouth daily.      metoprolol succinate 50 MG 24 hr tablet   Commonly known as: TOPROL-XL   Take 1 tablet (50 mg total) by mouth daily.      multivitamins ther. w/minerals Tabs   Take 1 tablet by mouth daily.      nitroGLYCERIN 0.4 MG SL tablet   Commonly known as: NITROSTAT   Place 0.4 mg under the tongue every 5 (five) minutes as needed. For chest pain      omeprazole 10 MG capsule   Commonly known as: PRILOSEC   Take 1 capsule (10 mg total) by mouth daily.         Follow-up Information    Follow up with SETHI,PRAMODKUMAR P, MD. Schedule an appointment as soon as possible for a visit in 1 month. (ask for "TCD bubble study and emboli monitoring" you will see Dr. Pearlean Brownie as well)    Contact information:   [address], [address] [name] [place] [address] [phone]       Follow up with Darrow Bussing, MD. Schedule an appointment as soon as possible for a visit in 1 week.   Contact information:   [address] [place] [address] [phone]       Follow up with Elyn Aquas., MD. (for holter monitor)    Contact information:   [address] [place] [address] [phone]           The results of significant diagnostics from this hospitalization (including imaging, microbiology, ancillary and laboratory) are listed below for reference.    Significant Diagnostic Studies: Ct Head Wo Contrast  07/06/2012  *RADIOLOGY REPORT*  Clinical Data: Right-sided weakness, slurred speech.  CT HEAD WITHOUT CONTRAST  Technique:  Contiguous axial images were obtained from the base of the skull through the vertex without contrast.   Comparison: None.  Findings: Mild periventricular and subcortical white matter changes, nonspecific however often seen in the setting of chronic microangiopathic change.  Tiny hypodensity within the left caudate head as seen on series 2 image 13.  No mass effect. There is no evidence for acute hemorrhage, hydrocephalus, mass lesion, or abnormal extra-axial fluid collection.  No definite CT evidence for acute cortical based infarction.  The visualized paranasal sinuses and mastoid air cells are predominately clear.  IMPRESSION: Tiny hypodensity within the left  caudate head may represent volume averaging or age indeterminate lacunar infarction. No mass effect.  Mild white matter changes as above.  Otherwise, no CT evidence of acute intracranial abnormality. MRI has increased sensitivity for acute ischemia detection if clinical concern persists.   Original Report Authenticated By: Waneta Martins, M.D.    Mr Brain Wo Contrast  07/06/2012  *RADIOLOGY REPORT*  Clinical Data:  Right-sided weakness and slurred speech.  MRI HEAD WITHOUT CONTRAST MRA HEAD WITHOUT CONTRAST  Technique:  Multiplanar, multiecho pulse sequences of the brain and surrounding structures were obtained without intravenous contrast. Angiographic images of the head were obtained using MRA technique without contrast.  Comparison:  CT head without contrast 07/06/2012.  MRI HEAD  Findings:  Scattered foci of restricted effusion are present within the posterior left insular cortex, posterior left frontal and parietal lobes.  These reside within the posterior left MCA distribution.  No hemorrhage or mass lesion is evident.  T2 hyperintensities correspond with the areas of restricted diffusion. No right-sided infarcts are present.  Mild atrophy is present.  There are additional subcortical T2 hyperintensities bilaterally, greater than expected for age.  Flow is present in the major intracranial arteries.  The globes and orbits are intact.  The paranasal  sinuses and mastoid air cells are clear.  IMPRESSION:  1.  Acute / subacute scattered posterior left MCA territory infarcts. 2.  Atrophy and diffuse white matter changes.  There is a greater than expected for age. The finding is nonspecific but can be seen in the setting of chronic microvascular ischemia, a demyelinating process such as multiple sclerosis, vasculitis, complicated migraine headaches, or as the sequelae of a prior infectious or inflammatory process.  MRA HEAD  Findings: The internal carotid arteries demonstrate mild irregularity through the cavernous segment bilaterally.  There is no significant stenosis relative to the distal vessel.  The A1 and M1 segments are normal.  No definite anterior communicating artery is evident.  There is asymmetric attenuation of distal left and three vessels posteriorly, likely contributing to the areas of infarction.  No significant proximal stenosis or occlusion is evident.  The vertebral arteries are codominant.  The left PICA origin is visualized and within normal limits.  The right AICA is noted.  The basilar artery is within normal limits.  Both posterior cerebral arteries originate from basilar tip.  The PCA branch vessels are within normal limits.  IMPRESSION:  1.  Asymmetric attenuation of distal left MCA branch vessels.  This corresponds with the area of acute infarction.  No significant proximal stenosis or occlusion is evident. 2.  No other significant proximal stenosis, aneurysm, or branch vessel occlusion.  These results were called by telephone on 07/06/2012 at 1 o'clock p.m. to Dr. Oletta Lamas, who verbally acknowledged these results.   Original Report Authenticated By: Jamesetta Orleans. MATTERN, M.D.    Mr Maxine Glenn Head/brain Wo Cm  07/06/2012  *RADIOLOGY REPORT*  Clinical Data:  Right-sided weakness and slurred speech.  MRI HEAD WITHOUT CONTRAST MRA HEAD WITHOUT CONTRAST  Technique:  Multiplanar, multiecho pulse sequences of the brain and surrounding structures  were obtained without intravenous contrast. Angiographic images of the head were obtained using MRA technique without contrast.  Comparison:  CT head without contrast 07/06/2012.  MRI HEAD  Findings:  Scattered foci of restricted effusion are present within the posterior left insular cortex, posterior left frontal and parietal lobes.  These reside within the posterior left MCA distribution.  No hemorrhage or mass lesion is evident.  T2 hyperintensities  correspond with the areas of restricted diffusion. No right-sided infarcts are present.  Mild atrophy is present.  There are additional subcortical T2 hyperintensities bilaterally, greater than expected for age.  Flow is present in the major intracranial arteries.  The globes and orbits are intact.  The paranasal sinuses and mastoid air cells are clear.  IMPRESSION:  1.  Acute / subacute scattered posterior left MCA territory infarcts. 2.  Atrophy and diffuse white matter changes.  There is a greater than expected for age. The finding is nonspecific but can be seen in the setting of chronic microvascular ischemia, a demyelinating process such as multiple sclerosis, vasculitis, complicated migraine headaches, or as the sequelae of a prior infectious or inflammatory process.  MRA HEAD  Findings: The internal carotid arteries demonstrate mild irregularity through the cavernous segment bilaterally.  There is no significant stenosis relative to the distal vessel.  The A1 and M1 segments are normal.  No definite anterior communicating artery is evident.  There is asymmetric attenuation of distal left and three vessels posteriorly, likely contributing to the areas of infarction.  No significant proximal stenosis or occlusion is evident.  The vertebral arteries are codominant.  The left PICA origin is visualized and within normal limits.  The right AICA is noted.  The basilar artery is within normal limits.  Both posterior cerebral arteries originate from basilar tip.  The  PCA branch vessels are within normal limits.  IMPRESSION:  1.  Asymmetric attenuation of distal left MCA branch vessels.  This corresponds with the area of acute infarction.  No significant proximal stenosis or occlusion is evident. 2.  No other significant proximal stenosis, aneurysm, or branch vessel occlusion.  These results were called by telephone on 07/06/2012 at 1 o'clock p.m. to Dr. Oletta Lamas, who verbally acknowledged these results.   Original Report Authenticated By: Jamesetta Orleans. MATTERN, M.D.     Microbiology: No results found for this or any previous visit (from the past 240 hour(s)).   Labs: Basic Metabolic Panel:  Lab 07/06/12 4098  NA 137  K 4.2  CL 103  CO2 23  GLUCOSE 124*  BUN 20  CREATININE 1.20  CALCIUM 9.5  MG --  PHOS --   Liver Function Tests:  Lab 07/06/12 0024  AST 27  ALT 27  ALKPHOS 67  BILITOT 0.3  PROT 7.0  ALBUMIN 3.9   No results found for this basename: LIPASE:5,AMYLASE:5 in the last 168 hours No results found for this basename: AMMONIA:5 in the last 168 hours CBC:  Lab 07/06/12 0024  WBC 5.6  NEUTROABS --  HGB 13.2  HCT 39.7  MCV 90.8  PLT 224   Cardiac Enzymes: No results found for this basename: CKTOTAL:5,CKMB:5,CKMBINDEX:5,TROPONINI:5 in the last 168 hours BNP: BNP (last 3 results) No results found for this basename: PROBNP:3 in the last 8760 hours CBG: No results found for this basename: GLUCAP:5 in the last 168 hours  Time coordinating discharge: 45 minutes  Signed:  Marlin Canary  [facility] 07/07/2012, 5:27 PM

## 2012-07-07 NOTE — H&P (Signed)
-  agree with above. -HgbA1c, fasting lipid panel  -MRI, as above. -OT consult, Speech consult  -Echocardiogram as above. -Carotid dopplers as above.  Prophylactic therapy-Antiplatelet med: he was on Aspirin and plavix will discuss with Neuro. -risk factor modification  -Cardiac Monitoring  -Keep MAP 70

## 2012-07-07 NOTE — Progress Notes (Signed)
SLP Cancellation Note  Speech-language evaluation cancelled at this time due to patient receiving procedure or test.  Will return today if schedule allows, otherwise will f/u 10/5.  Maryjo Rochester T 07/07/2012, 9:43 AM

## 2012-07-07 NOTE — Progress Notes (Signed)
  Echocardiogram Echocardiogram Transesophageal has been performed.  Corey [name] 07/07/2012, 10:49 AM

## 2012-07-07 NOTE — Evaluation (Signed)
Occupational Therapy Evaluation Patient Details Name: Corey [name] MRN: [id number] DOB: 1946-03-05 Today's Date: 07/07/2012 Time: 4098-1191 OT Time Calculation (min): 12 min  OT Assessment / Plan / Recommendation Clinical Impression  Pt admitted with right UE weakness and tingling as well as speech difficulties. MRI findings show Acute / subacute scattered posterior left MCA territory infarcts. Symptoms have almost completely resolved.  Pt is at baseline  with ADLs and functional mobility. All education complete. No further acute OT needs. Please re-order if pt experiences functional decline.     OT Assessment  Patient does not need any further OT services    Follow Up Recommendations  No OT follow up    Barriers to Discharge      Equipment Recommendations  None recommended by OT    Recommendations for Other Services    Frequency       Precautions / Restrictions     Pertinent Vitals/Pain See vitals    ADL  Eating/Feeding: Performed;Independent Where Assessed - Eating/Feeding: Edge of bed Lower Body Dressing: Performed;Independent Where Assessed - Lower Body Dressing: Unsupported sitting Toilet Transfer: Simulated;Independent Toilet Transfer Method: Sit to Barista:  (bed) Transfers/Ambulation Related to ADLs: Independent ADL Comments: Pt is at baseline with ADLs and functional mobility.  Educated pt and wife on stroke signs and symptoms, and they verbalized understanding.  Pt reports having some minimal difficulty texting (notices it is harder to plan the finger movements he wants to do).  Educated pt to continue using right hand for tasks such as texting to assist in maximizing motor planning.      OT Diagnosis:    OT Problem List:   OT Treatment Interventions:     OT Goals    Visit Information  Last OT Received On: 07/07/12    Subjective Data      Prior Functioning     Home Living Lives With: Spouse Additional Comments: has life  alert Prior Function Level of Independence: Independent Driving: Yes Vocation: Part time employment Communication Communication:  (pt reports occasional expressive difficulties) Dominant Hand: Right         Vision/Perception Vision - Assessment Additional Comments: Pt with episode of diplopia at onset of symptoms but reports his vision has returned to baseline and is experiencing no difficulty. Praxis Praxis: Impaired Praxis Impairment Details: Motor planning Praxis-Other Comments: Very slight impairment during fine motor tasks such as texting (right hand). Requires slight increased time during tasks but is [facility].   Cognition  Overall Cognitive Status: Appears within functional limits for tasks assessed/performed Arousal/Alertness: Awake/alert Orientation Level: Appears intact for tasks assessed Behavior During Session: [facility] for tasks performed    Extremity/Trunk Assessment Right Upper Extremity Assessment RUE ROM/Strength/Tone: Within functional levels RUE Sensation: WFL - Light Touch;WFL - Proprioception RUE Coordination: WFL - gross/fine motor Left Upper Extremity Assessment LUE ROM/Strength/Tone: Within functional levels LUE Sensation: WFL - Light Touch;WFL - Proprioception LUE Coordination: WFL - gross/fine motor     Mobility Bed Mobility Bed Mobility: Supine to Sit;Sitting - Scoot to Edge of Bed;Sit to Supine Supine to Sit: 7: Independent Sitting - Scoot to Edge of Bed: 7: Independent Sit to Supine: 7: Independent Transfers Sit to Stand: 7: Independent;From bed Stand to Sit: 7: Independent;To bed     Shoulder Instructions     Exercise     Balance     End of Session OT - End of Session Activity Tolerance: Patient tolerated treatment well Patient left: in bed;with family/visitor present;with call bell/phone within  reach Nurse Communication: Mobility status  GO Functional Assessment Tool Used: clinical judgement Functional Limitation: Self care Self Care  Current Status ([id number]): 0 percent impaired, limited or restricted Self Care Goal Status (U0454): 0 percent impaired, limited or restricted Self Care Discharge Status [phone]): 0 percent impaired, limited or restricted  07/07/2012 Cipriano Mile OTR/L Pager [phone] Office [phone]  Cipriano Mile 07/07/2012, 2:02 PM

## 2012-07-07 NOTE — Op Note (Signed)
No PFO No thrombus. 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  Full report to follow.

## 2012-07-07 NOTE — Interval H&P Note (Signed)
History and Physical Interval Note:  07/07/2012 10:21 AM  Corey [name]  has presented today for surgery, with the diagnosis of stroke   The various methods of treatment have been discussed with the patient and family. After consideration of risks, benefits and other options for treatment, the patient has consented to  Procedure(s) (LRB) with comments: TRANSESOPHAGEAL ECHOCARDIOGRAM (TEE) (N/A) as a surgical intervention .  The patient's history has been reviewed, patient examined, no change in status, stable for surgery.  I have reviewed the patient's chart and labs.  Questions were answered to the patient's satisfaction.     Dietrich Pates

## 2012-07-07 NOTE — Progress Notes (Signed)
Stroke Team Progress Note  HISTORY Corey [name] is an 66 y.o. male who takes ASA and Plavix on a daily basis. He was watching TV last night when around 10:30 he noted his right arm go numb and speech difficulty. He was brought to Hospital where initial CT head showed questionable age indeterminate infarct in left caudate and MRI showing left MCA distribution infarct with asymmetric attenuation of distal left MCA branch vessels. Although patient presented to ED at 11:49 his symptoms rapidly were resolving. Patient currently shows some intermittent expressive difficulties (improving) per family and right UE pronator drift. He was evaluated in the ED as a TIA. Once stroke was recognized, he was admitted to the hospital.  Patient was not a TPA candidate secondary to resolving symptoms. He was admitted for further evaluation and treatment.  SUBJECTIVE Patient is currently in endo following TEE just now. Overall he feels his condition is rapidly improving. He feels back to baseline.  OBJECTIVE Most recent Vital Signs: Filed Vitals:   07/07/12 0257 07/07/12 0545 07/07/12 0922 07/07/12 1012  BP: 121/65 124/88 138/88 160/92  Pulse: 80 89    Temp: 98.2 F (36.8 C) 98.2 F (36.8 C) 98.5 F (36.9 C)   TempSrc: Oral Oral Oral   Resp: 16 17 14 24   Height:      Weight:      SpO2: 100% 100% 94% 95%   CBG (last 3)  No results found for this basename: GLUCAP:3 in the last 72 hours Intake/Output from previous day: 10/03 0701 - 10/04 0700 In: 363 [P.O.:360; I.V.:3] Out: -   IV Fluid Intake:     MEDICATIONS    . aspirin  300 mg Rectal Daily   Or  . aspirin  325 mg Oral Daily  . aspirin  325 mg Oral Daily  . atorvastatin  40 mg Oral Daily  . clopidogrel  75 mg Oral Daily  . enoxaparin (LOVENOX) injection  40 mg Subcutaneous Q24H  . fenofibrate  160 mg Oral Daily  . fluticasone  2 spray Each Nare Daily  . lisinopril  20 mg Oral Daily  . LORazepam  1 mg Intravenous Once  . metoprolol succinate   50 mg Oral Daily  . multivitamin with minerals  1 tablet Oral Daily  . pantoprazole  40 mg Oral Q1200  . sodium chloride  3 mL Intravenous Q12H  . Tamsulosin HCl  0.4 mg Oral Daily   PRN:  sodium chloride, acetaminophen, nitroGLYCERIN, senna-docusate, sodium chloride  Diet:  NPO  Activity:  As tolerated DVT Prophylaxis:  Lovenox 40 mg sq daily   CLINICALLY SIGNIFICANT STUDIES Basic Metabolic Panel:  Lab 07/06/12 1610  NA 137  K 4.2  CL 103  CO2 23  GLUCOSE 124*  BUN 20  CREATININE 1.20  CALCIUM 9.5  MG --  PHOS --   Liver Function Tests:  Lab 07/06/12 0024  AST 27  ALT 27  ALKPHOS 67  BILITOT 0.3  PROT 7.0  ALBUMIN 3.9   CBC:  Lab 07/06/12 0024  WBC 5.6  NEUTROABS --  HGB 13.2  HCT 39.7  MCV 90.8  PLT 224   Coagulation:  Lab 07/06/12 0024  LABPROT 12.8  INR 0.97   Lipid Panel    Component Value Date/Time   CHOL 107 07/07/2012 0530   TRIG 176* 07/07/2012 0530   HDL 25* 07/07/2012 0530   CHOLHDL 4.3 07/07/2012 0530   VLDL 35 07/07/2012 0530   LDLCALC 47 07/07/2012 0530   HgbA1C  Lab Results  Component Value Date   HGBA1C 6.0* 07/06/2012    Urine Drug Screen:   No results found for this basename: labopia, cocainscrnur, labbenz, amphetmu, thcu, labbarb    Alcohol Level: No results found for this basename: ETH:2 in the last 168 hours  CT of the brain   Tiny hypodensity within the left caudate head may represent volume averaging or age indeterminate lacunar infarction. No mass effect.  Mild white matter changes as above.  Otherwise, no CT evidence of acute intracranial abnormality.    MRI of the brain   1.  Acute / subacute scattered posterior left MCA territory infarcts. 2.  Atrophy and diffuse white matter changes.  There is a greater than expected for age. The finding is nonspecific but can be seen in the setting of chronic microvascular ischemia, a demyelinating process such as multiple sclerosis, vasculitis, complicated migraine headaches, or as the  sequelae of a prior infectious or inflammatory process.   MRA of the brain    1.  Asymmetric attenuation of distal left MCA branch vessels.  This corresponds with the area of acute infarction.  No significant proximal stenosis or occlusion is evident. 2.  No other significant proximal stenosis, aneurysm, or branch vessel occlusion.    2D Echocardiogram  EF 55-60% with no source of embolus.   TEE no source of embolus, no PFO  Carotid Doppler  The right internal carotid artery has no evidence of hemodynamically significant stenosis. The left internal carotid artery demonstrates elevated velocities in the 40-59% range.Vertebrals are patent with antegrade flow.  CXR    EKG  normal EKG, normal sinus rhythm, unchanged from previous tracings.   Therapy Recommendations PT - none; OT -   Physical Exam  Pleasant middle aged Caucasian male not in distress.Awake alert. Afebrile. Head is nontraumatic. Neck is supple without bruit. Hearing is normal. Cardiac exam no murmur or gallop. Lungs are clear to auscultation. Distal pulses are well felt.  Neurological Exam : Awake alert oriented x 3 normal speech and language.Fundi not visualized. Vision acuity and fields appear normal. Mild right lower face asymmetry. Tongue midline. No drift. Mild diminished fine finger movements on right  . Mild right grip weak.. Normal sensation . Normal coordination.  ASSESSMENT [name] is a 66 y.o. male presenting with right arm numbness and speech difficulty.  Imaging confirms a patchy left MCA infarct. Infarct felt to be embolic secondary to unknown source. Work up completed. On aspirin 325 mg orally every day and clopidogrel 75 mg orally every day prior to admission. Now on aspirin 325 mg orally every day and clopidogrel 75 mg orally every day for secondary stroke prevention. Patient with no resultant stroke symptoms.   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 Hypertension Hyperlipidemia, LDL 47, on  statin PTA and now, at goal LDL < 100 CAD s/p stent RCA  Hospital day # 2  TREATMENT/PLAN  Continue aspirin 81 mg orally every day and clopidogrel 75 mg orally every day for secondary stroke prevention. Full dose aspirin with plavix increases risk of cerebral hemorrhage following stroke. Typically would not recommend the two together, but given cardiac history, will continue both. Please schedule an outpatient TCD bubble study with emboli monitoring with Dr. Pearlean Brownie in 1 month to further evaluate PFO. Have patient call for appointment. Please schedule outpatient telemetry monitoring to assess patient for atrial fibrillation as source of stroke. May be arranged with patient's cardiologist, or cardiologist of choice.  Ok to  discharge from neuro standpoint once stable post TEE. followup with Dr. Pearlean Brownie during TCD bubble as above.  Annie Main, MSN, RN, ANVP-BC, ANP-BC, [facility] Pager: [phone] 07/07/2012 10:13 AM  Scribe for Dr. Delia Heady, Stroke Center Medical Director, who has personally reviewed chart, pertinent data, examined the patient and developed the plan of care. Pager:  [phone]

## 2012-07-07 NOTE — Care Management Note (Signed)
    Page 1 of 1   07/10/2012     3:46:37 PM   CARE MANAGEMENT NOTE 07/10/2012  Patient:  JAKEEM, GRAPE A   Account Number:  [id number]  Date Initiated:  07/07/2012  Documentation initiated by:  Onnie Boer  Subjective/Objective Assessment:   PT WAS ADMITTED WITH NUMBNESS AND SLURRED SPEECH     Action/Plan:   PROGRESSION OF CARE AND DISCHARGE PLANNING   Anticipated DC Date:  07/08/2012   Anticipated DC Plan:  HOME/SELF CARE      DC Planning Services  CM consult      Choice offered to / List presented to:             Status of service:  Completed, signed off Medicare Important Message given?   (If response is "NO", the following Medicare IM given date fields will be blank) Date Medicare IM given:   Date Additional Medicare IM given:    Discharge Disposition:  HOME/SELF CARE  Per UR Regulation:  Reviewed for med. necessity/level of care/duration of stay  If discussed at Long Length of Stay Meetings, dates discussed:    Comments:  07/07/12 Onnie Boer, RN, BSN 1545 PT WAS DC'D TO HOME WITH OP SPEECH THAT HAS BEEN ARRANGED AT THE NEURO [facility]  07/07/12 Onnie Boer, RN, BSN 1233 PT WAS ADMITTED WITH SLURRED SPEECH AND NUMBNESS.  PTA PT WAS AT HOME WITH SELF CARE.  WILL F/U ON DC NEEDS AND RECOMMENDATIONS.

## 2012-07-10 ENCOUNTER — Telehealth: Payer: Self-pay | Admitting: Cardiovascular Disease

## 2012-07-10 ENCOUNTER — Encounter (HOSPITAL_COMMUNITY): Payer: Self-pay

## 2012-07-10 ENCOUNTER — Encounter (HOSPITAL_COMMUNITY): Payer: Self-pay | Admitting: Internal Medicine

## 2012-07-10 DIAGNOSIS — I639 Cerebral infarction, unspecified: Secondary | ICD-10-CM

## 2012-07-10 DIAGNOSIS — E785 Hyperlipidemia, unspecified: Secondary | ICD-10-CM

## 2012-07-10 DIAGNOSIS — I25810 Atherosclerosis of coronary artery bypass graft(s) without angina pectoris: Secondary | ICD-10-CM

## 2012-07-10 DIAGNOSIS — I2510 Atherosclerotic heart disease of native coronary artery without angina pectoris: Secondary | ICD-10-CM

## 2012-07-10 MED ORDER — OMEPRAZOLE 10 MG PO CPDR: 10.0000 mg | DELAYED_RELEASE_CAPSULE | Freq: Every day | ORAL | Status: DC

## 2012-07-10 MED ORDER — NITROGLYCERIN 0.4 MG SL SUBL: 0.4000 mg | SUBLINGUAL_TABLET | SUBLINGUAL | Status: DC | PRN

## 2012-07-10 NOTE — Telephone Encounter (Signed)
Reviewed ed dc, needs event monitor/ ordered and sent to [facility] to schedule and callwith date and time

## 2012-07-10 NOTE — Telephone Encounter (Signed)
Fax Received. Refill Completed. Darryl Blumenstein Chowoe (R.M.A)   

## 2012-07-10 NOTE — Telephone Encounter (Signed)
New message:  Pt discharged from [facility] on Friday and was told he need a heart monitor.  Please check and call back and advise.  If no answer at home number call cell.

## 2012-07-10 NOTE — Telephone Encounter (Signed)
Will route also to [employer]

## 2012-07-11 NOTE — ED Provider Notes (Signed)
Medical screening examination/treatment/procedure(s) were performed by non-physician practitioner and as supervising physician I was immediately available for consultation/collaboration.   Gavin Pound. Oletta Lamas, MD 07/11/12 2334

## 2012-07-13 ENCOUNTER — Telehealth: Payer: Self-pay | Admitting: Cardiovascular Disease

## 2012-07-13 NOTE — Telephone Encounter (Signed)
New message;  Dosage of Lipitor, he wants to make sure he is taking correct dose.

## 2012-07-13 NOTE — Telephone Encounter (Signed)
Confirmed atorvastatin is 40 mg daily, labs were good one month ago, pt thought he may have been on 80 mg but not seen in chart. Pt to continue with 40 mg.

## 2012-07-17 ENCOUNTER — Telehealth: Payer: Self-pay | Admitting: Cardiovascular Disease

## 2012-07-17 NOTE — Telephone Encounter (Signed)
New Probelm:    Patient called in wanting clearance to go to the beach this week.  Please call back.

## 2012-07-17 NOTE — Telephone Encounter (Signed)
Per Dr Elease Hashimoto he may go on vacation to the beach.

## 2012-07-25 ENCOUNTER — Encounter (INDEPENDENT_AMBULATORY_CARE_PROVIDER_SITE_OTHER): Payer: Medicare Other

## 2012-07-25 DIAGNOSIS — I25810 Atherosclerosis of coronary artery bypass graft(s) without angina pectoris: Secondary | ICD-10-CM

## 2012-07-25 DIAGNOSIS — I639 Cerebral infarction, unspecified: Secondary | ICD-10-CM

## 2012-07-25 DIAGNOSIS — I6789 Other cerebrovascular disease: Secondary | ICD-10-CM

## 2012-07-27 ENCOUNTER — Ambulatory Visit: Payer: Medicare Other | Attending: Family Medicine

## 2012-07-27 DIAGNOSIS — I69928 Other speech and language deficits following unspecified cerebrovascular disease: Secondary | ICD-10-CM | POA: Insufficient documentation

## 2012-07-27 DIAGNOSIS — IMO0001 Reserved for inherently not codable concepts without codable children: Secondary | ICD-10-CM | POA: Insufficient documentation

## 2012-08-22 ENCOUNTER — Ambulatory Visit (INDEPENDENT_AMBULATORY_CARE_PROVIDER_SITE_OTHER): Payer: Medicare Other | Admitting: Cardiovascular Disease

## 2012-08-22 ENCOUNTER — Encounter: Payer: Self-pay | Admitting: Cardiovascular Disease

## 2012-08-22 VITALS — BP 143/84 | HR 72 | Ht 74.0 in | Wt 254.0 lb

## 2012-08-22 DIAGNOSIS — I6350 Cerebral infarction due to unspecified occlusion or stenosis of unspecified cerebral artery: Secondary | ICD-10-CM

## 2012-08-22 DIAGNOSIS — I639 Cerebral infarction, unspecified: Secondary | ICD-10-CM

## 2012-08-22 DIAGNOSIS — I2510 Atherosclerotic heart disease of native coronary artery without angina pectoris: Secondary | ICD-10-CM

## 2012-08-22 NOTE — Assessment & Plan Note (Signed)
Corey [name] presents for followup visit after presenting to the hospital last month with a stroke. The transesophageal echo was negative for a patent foramen ovale and for a left atrial appendage thrombus. He wore a cardiac monitor for 3 weeks and did not have any arrhythmias.  He has recovered quite nicely. He still has some occasional speech problems but overall his strength he is fairly close to normal.  At this point I do not have any further recommendations. I'll have him followup with Dr. Pearlean Brownie to see if there is anything further that he needs to do for workup of a stroke. He's not on any new medications. He was on Plavix   before his stroke and continues to take the Plavix now.

## 2012-08-22 NOTE — Progress Notes (Signed)
Corey [name] Date of Birth  Dec 24, 1945       [facility] Office [address], [address]  [address] [place], [place]  [address]   [place], [place]  [address] [phone]     [phone]   Fax  [phone]    Fax [phone]  Problem List: 1. Coronary artery disease-status post PTCA and stenting of his right coronary artery 2. Hypertension 3. Hyperlipidemia 4. 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 Status post PTCA and stenting of his right coronary artery. He also has a history of hypertension, and hyperlipidemia.  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  He had a stroke in Oct.  He had some speech problems and loss of fine motor control of the right arm.  TEE was negative for PFO or LAA thrombus.   Event monitor showed no A-Fib.  He has been on Plavix since his stents in    Current Outpatient Prescriptions on File Prior to Visit  Medication Sig Dispense Refill  . aspirin 325 MG tablet Take 325 mg by mouth daily.        Marland Kitchen atorvastatin (LIPITOR) 40 MG tablet Take 1 tablet (40 mg total) by mouth daily.  30 tablet  5  . clopidogrel (PLAVIX) 75 MG tablet Take 1 tablet (75 mg total) by mouth daily.  30 tablet  5  . fenofibrate 160 MG tablet TAKE 1 TABLET EVERY DAY  90 tablet  3  . fluticasone (FLONASE) 50 MCG/ACT nasal spray Place 2 sprays into the nose daily.        Marland Kitchen lisinopril (PRINIVIL,ZESTRIL) 20 MG tablet Take 1 tablet (20 mg total) by mouth daily.  30 tablet  5  . metoprolol succinate (TOPROL-XL) 50 MG 24 hr tablet Take 1 tablet (50 mg total) by mouth daily.  90 tablet  4  . Multiple Vitamins-Minerals (MULTIVITAMINS THER. W/MINERALS) TABS Take 1 tablet by mouth daily.        . nitroGLYCERIN (NITROSTAT) 0.4 MG SL tablet Place 1 tablet (0.4 mg total) under the tongue every 5 (five) minutes as needed. For chest pain   25 tablet  5  . omeprazole (PRILOSEC) 10 MG capsule Take 1 capsule (10 mg total) by mouth daily.  30 capsule  5  . Tamsulosin HCl (FLOMAX) 0.4 MG CAPS Take 0.4 mg by mouth daily.          Allergies  Allergen Reactions  . Codeine     Heavy amounts cause nausea  . Monopril (Fosinopril Sodium)     Muscle aches & pain  . Ampicillin Rash    Past Medical History  Diagnosis Date  . Hypertension   . Dyslipidemia   . Dyspnea   . Chest pain     Sees Dr. Elease Hashimoto, saw last April 2012  . SOB (shortness of breath)   . GERD (gastroesophageal reflux disease)   . Complication of anesthesia     does have small airway  . PONV (postoperative nausea and vomiting)   . Recurrent upper respiratory infection (URI)     3months ago, treated with claritin D and 5 day dose pack  . Pneumonia     1992  . Sleep apnea     cpap  . Arthritis     back, neck, shoulders  .  Coronary artery disease     STATUS POST PTCA AND STENTING OF HIS RIGHT CORONARY ARTERY    Past Surgical History  Procedure Date  . Cardiac catheterization 12/23/2005    NORMAL CHAMBER SIZE AND NORMAL GLOBAL SYSTOLIC FUNCTION. EF 65%  . Cardiac catheterization 12/24/2004    NORMAL LEFT VENTRICULAR SYSTOLIC FUNCTION.THE LEFT VENTRICLE APPEARS TO BE MILDLY ENLARGED.  . Cardiac catheterization 05/27/2003    MILD CARDIOMEGALY. EF 55-60%  . Cervical fusion 07/2001  . Lumbar fusion 10/2003    L5-S1  . Knee arthroscopy 10/2004    LEFT KNEE  . Replacement total knee     left  . Tonsillectomy   . Coronary stent placement     first Aug 2004, 2nd stent 12/2005  . Cardiovascular stress test     2011  . US echocardiography     2011  . Tee without cardioversion 07/07/2012    Procedure: TRANSESOPHAGEAL ECHOCARDIOGRAM (TEE);  Surgeon: Pricilla Riffle, MD;  Location: [facility] ENDOSCOPY;  Service: Cardiovascular;  Laterality: N/A;    History  Smoking status  . Former Smoker -- 1.0 packs/day for 20 years  . Types: Cigarettes  . Quit date: 10/04/2002    Smokeless tobacco  . Never Used    History  Alcohol Use No    Family History  Problem Relation Age of Onset  . Hypertension Mother   . Heart disease Father     Reviw of Systems:  Reviewed in the HPI.  All other systems are negative.  Physical Exam: Blood pressure 143/84, pulse 72, height 6\' 2"  (1.88 m), weight 254 lb (115.214 kg). General: Well developed, well nourished, in no acute distress.  Head: Normocephalic, atraumatic, sclera non-icteric, mucus membranes are moist,   Neck: Supple. Carotids are 2 + without bruits. No JVD  Lungs: Clear bilaterally to auscultation.  Heart: regular rate.  normal  S1 S2. No murmurs, gallops or rubs.  Abdomen: Soft, non-tender, non-distended with normal bowel sounds. No hepatomegaly. No rebound/guarding. No masses.  Msk:  Strength and tone are normal  Extremities: No clubbing or cyanosis. No edema.  Distal pedal pulses are 2+ and equal bilaterally.  Neuro: Alert and oriented X 3. Moves all extremities spontaneously.  Psych:  Responds to questions appropriately with a normal affect.  ECG: 08/22/2012-normal sinus rhythm at 60 beats a minute. EKG is normal.  Assessment / Plan:

## 2012-08-22 NOTE — Assessment & Plan Note (Signed)
Stable

## 2012-08-22 NOTE — Patient Instructions (Addendum)
Your physician wants you to follow-up in: 6 months  You will receive a reminder letter in the mail two months in advance. If you don't receive a letter, please call our office to schedule the follow-up appointment.  Your physician recommends that you return for a FASTING lipid profile: 6 months with lipomed profile

## 2012-09-15 ENCOUNTER — Other Ambulatory Visit: Payer: Self-pay

## 2012-09-15 MED ORDER — METOPROLOL SUCCINATE ER 50 MG PO TB24: 50.0000 mg | ORAL_TABLET | Freq: Every day | ORAL | Status: DC

## 2012-09-15 MED ORDER — ATORVASTATIN CALCIUM 40 MG PO TABS: 40.0000 mg | ORAL_TABLET | Freq: Every day | ORAL | Status: DC

## 2012-09-18 ENCOUNTER — Emergency Department (HOSPITAL_COMMUNITY): Payer: Medicare Other

## 2012-09-18 ENCOUNTER — Encounter (HOSPITAL_COMMUNITY): Payer: Self-pay | Admitting: Adult Health

## 2012-09-18 ENCOUNTER — Telehealth: Payer: Self-pay | Admitting: Nurse Practitioner

## 2012-09-18 ENCOUNTER — Other Ambulatory Visit: Payer: Self-pay | Admitting: *Deleted

## 2012-09-18 ENCOUNTER — Telehealth: Payer: Self-pay | Admitting: Cardiovascular Disease

## 2012-09-18 ENCOUNTER — Emergency Department (HOSPITAL_COMMUNITY)
Admission: EM | Admit: 2012-09-18 | Discharge: 2012-09-18 | Disposition: A | Discharge status: Home / Self Care | Payer: Medicare Other | Attending: Emergency Medicine | Admitting: Emergency Medicine

## 2012-09-18 DIAGNOSIS — Z7982 Long term (current) use of aspirin: Secondary | ICD-10-CM | POA: Insufficient documentation

## 2012-09-18 DIAGNOSIS — I10 Essential (primary) hypertension: Secondary | ICD-10-CM | POA: Insufficient documentation

## 2012-09-18 DIAGNOSIS — E785 Hyperlipidemia, unspecified: Secondary | ICD-10-CM | POA: Insufficient documentation

## 2012-09-18 DIAGNOSIS — Z87891 Personal history of nicotine dependence: Secondary | ICD-10-CM | POA: Insufficient documentation

## 2012-09-18 DIAGNOSIS — Z79899 Other long term (current) drug therapy: Secondary | ICD-10-CM | POA: Insufficient documentation

## 2012-09-18 DIAGNOSIS — Z8739 Personal history of other diseases of the musculoskeletal system and connective tissue: Secondary | ICD-10-CM | POA: Insufficient documentation

## 2012-09-18 DIAGNOSIS — I2510 Atherosclerotic heart disease of native coronary artery without angina pectoris: Secondary | ICD-10-CM | POA: Insufficient documentation

## 2012-09-18 DIAGNOSIS — Z8669 Personal history of other diseases of the nervous system and sense organs: Secondary | ICD-10-CM | POA: Insufficient documentation

## 2012-09-18 DIAGNOSIS — Z9861 Coronary angioplasty status: Secondary | ICD-10-CM | POA: Insufficient documentation

## 2012-09-18 DIAGNOSIS — R002 Palpitations: Secondary | ICD-10-CM | POA: Insufficient documentation

## 2012-09-18 DIAGNOSIS — R000 Tachycardia, unspecified: Secondary | ICD-10-CM | POA: Insufficient documentation

## 2012-09-18 DIAGNOSIS — Z8673 Personal history of transient ischemic attack (TIA), and cerebral infarction without residual deficits: Secondary | ICD-10-CM | POA: Insufficient documentation

## 2012-09-18 DIAGNOSIS — Z8719 Personal history of other diseases of the digestive system: Secondary | ICD-10-CM | POA: Insufficient documentation

## 2012-09-18 DIAGNOSIS — Z8701 Personal history of pneumonia (recurrent): Secondary | ICD-10-CM | POA: Insufficient documentation

## 2012-09-18 DIAGNOSIS — R51 Headache: Secondary | ICD-10-CM | POA: Insufficient documentation

## 2012-09-18 DIAGNOSIS — Z8679 Personal history of other diseases of the circulatory system: Secondary | ICD-10-CM | POA: Insufficient documentation

## 2012-09-18 DIAGNOSIS — R42 Dizziness and giddiness: Secondary | ICD-10-CM | POA: Insufficient documentation

## 2012-09-18 DIAGNOSIS — K219 Gastro-esophageal reflux disease without esophagitis: Secondary | ICD-10-CM | POA: Insufficient documentation

## 2012-09-18 LAB — CBC
HCT: 41.7 % (ref 39.0–52.0)
Hemoglobin: 13.7 g/dL (ref 13.0–17.0)
MCH: 29.4 pg (ref 26.0–34.0)
MCHC: 32.9 g/dL (ref 30.0–36.0)
MCV: 89.5 fL (ref 78.0–100.0)
Platelets: 245 10*3/uL (ref 150–400)
RBC: 4.66 MIL/uL (ref 4.22–5.81)
RDW: 13.5 % (ref 11.5–15.5)
WBC: 6.4 10*3/uL (ref 4.0–10.5)

## 2012-09-18 LAB — POCT I-STAT TROPONIN I: Troponin i, poc: 0.02 ng/mL (ref 0.00–0.08)

## 2012-09-18 LAB — BASIC METABOLIC PANEL
BUN: 20 mg/dL (ref 6–23)
CO2: 24 mEq/L (ref 19–32)
Calcium: 9.9 mg/dL (ref 8.4–10.5)
Chloride: 103 mEq/L (ref 96–112)
Creatinine, Ser: 1.07 mg/dL (ref 0.50–1.35)
GFR calc Af Amer: 82 mL/min — ABNORMAL LOW (ref >90)
GFR calc non Af Amer: 71 mL/min — ABNORMAL LOW (ref >90)
Glucose, Bld: 110 mg/dL — ABNORMAL HIGH (ref 70–99)
Potassium: 4.3 mEq/L (ref 3.5–5.1)
Sodium: 138 mEq/L (ref 135–145)

## 2012-09-18 MED ORDER — METOPROLOL TARTRATE 25 MG PO TABS
25.0000 mg | ORAL_TABLET | Freq: Once | ORAL | Status: AC
  Administered 2012-09-18: 25 mg via ORAL
  Filled 2012-09-18: qty 1

## 2012-09-18 MED ORDER — CLOPIDOGREL BISULFATE 75 MG PO TABS: 75.0000 mg | ORAL_TABLET | Freq: Every day | ORAL | Status: DC

## 2012-09-18 NOTE — ED Notes (Signed)
Pt states he has felt his heart racing all day long and has had associated dizziness.  Denies nausea or vomiting.  States slight HA intermittently since stroke on 07/05/12.   Denies CP, SOB

## 2012-09-18 NOTE — ED Provider Notes (Signed)
History     CSN: [id number]  Arrival date & time 09/18/12  1308   First MD Initiated Contact with Patient 09/18/12 2124      Chief Complaint  Patient presents with  . Tachycardia     Patient is a 66 y.o. male presenting with palpitations. The history is provided by the patient.  Palpitations  This is a new problem. Episode onset: earlier today. The problem occurs constantly. The problem has been gradually improving. Associated symptoms include dizziness. Pertinent negatives include no diaphoresis, no fever, no chest pain, no chest pressure, no exertional chest pressure, no irregular heartbeat, no near-syncope, no syncope, no abdominal pain, no vomiting, no back pain, no lower extremity edema, no weakness, no cough and no shortness of breath. Treatments tried: rest. The treatment provided moderate relief.  pt reports he went to exercise today and he noted his heart rate in low 100s It was not irregular.  He denied cp/sob.  No syncope.  He reports it never got above 122.  He walked on treadmill for 15 minutes without symptoms but his heart rate was in low 100s.  He takes toprol xl daily and did not miss his medications.  He now feels well and at his baseline No new meds added recently  Past Medical History  Diagnosis Date  . Hypertension   . Dyslipidemia   . Dyspnea   . Chest pain     Sees Dr. Elease Hashimoto, saw last April 2012  . SOB (shortness of breath)   . GERD (gastroesophageal reflux disease)   . Complication of anesthesia     does have small airway  . PONV (postoperative nausea and vomiting)   . Recurrent upper respiratory infection (URI)     3months ago, treated with claritin D and 5 day dose pack  . Pneumonia     1992  . Sleep apnea     cpap  . Arthritis     back, neck, shoulders  . Coronary artery disease     STATUS POST PTCA AND STENTING OF HIS RIGHT CORONARY ARTERY  . CVA (cerebral infarction)     Past Surgical History  Procedure Date  . Cardiac catheterization  12/23/2005    NORMAL CHAMBER SIZE AND NORMAL GLOBAL SYSTOLIC FUNCTION. EF 65%  . Cardiac catheterization 12/24/2004    NORMAL LEFT VENTRICULAR SYSTOLIC FUNCTION.THE LEFT VENTRICLE APPEARS TO BE MILDLY ENLARGED.  . Cardiac catheterization 05/27/2003    MILD CARDIOMEGALY. EF 55-60%  . Cervical fusion 07/2001  . Lumbar fusion 10/2003    L5-S1  . Knee arthroscopy 10/2004    LEFT KNEE  . Replacement total knee     left  . Tonsillectomy   . Coronary stent placement     first Aug 2004, 2nd stent 12/2005  . Cardiovascular stress test     2011  . US echocardiography     2011  . Tee without cardioversion 07/07/2012    Procedure: TRANSESOPHAGEAL ECHOCARDIOGRAM (TEE);  Surgeon: Pricilla Riffle, MD;  Location: [facility] ENDOSCOPY;  Service: Cardiovascular;  Laterality: N/A;    Family History  Problem Relation Age of Onset  . Hypertension Mother   . Heart disease Father     History  Substance Use Topics  . Smoking status: Former Smoker -- 1.0 packs/day for 20 years    Types: Cigarettes    Quit date: 10/04/2002  . Smokeless tobacco: Never Used  . Alcohol Use: No      Review of Systems  Constitutional: Negative for fever and  diaphoresis.  Respiratory: Negative for cough and shortness of breath.   Cardiovascular: Positive for palpitations. Negative for chest pain, syncope and near-syncope.  Gastrointestinal: Negative for vomiting, abdominal pain, diarrhea and blood in stool.  Musculoskeletal: Negative for back pain.  Skin: Negative for color change.  Neurological: Positive for dizziness. Negative for weakness.  Psychiatric/Behavioral: Negative for agitation.  All other systems reviewed and are negative.    Allergies  Codeine; Monopril; and Ampicillin  Home Medications   Current Outpatient Rx  Name  Route  Sig  Dispense  Refill  . ASPIRIN 325 MG PO TABS   Oral   Take 325 mg by mouth daily.           . ATORVASTATIN CALCIUM 40 MG PO TABS   Oral   Take 1 tablet (40 mg total) by mouth  daily.   30 tablet   5   . FENOFIBRATE 160 MG PO TABS   Oral   Take 160 mg by mouth daily.         Marland Kitchen FLUTICASONE PROPIONATE 50 MCG/ACT NA SUSP   Nasal   Place 2 sprays into the nose daily.           . IBUPROFEN 800 MG PO TABS   Oral   Take 800 mg by mouth every 8 (eight) hours as needed. For pain         . LISINOPRIL 20 MG PO TABS   Oral   Take 1 tablet (20 mg total) by mouth daily.   30 tablet   5   . METOPROLOL SUCCINATE ER 50 MG PO TB24   Oral   Take 1 tablet (50 mg total) by mouth daily.   30 tablet   5   . THERA M PLUS PO TABS   Oral   Take 1 tablet by mouth daily.           Marland Kitchen NITROGLYCERIN 0.4 MG SL SUBL   Sublingual   Place 1 tablet (0.4 mg total) under the tongue every 5 (five) minutes as needed. For chest pain   25 tablet   5   . OMEPRAZOLE 10 MG PO CPDR   Oral   Take 1 capsule (10 mg total) by mouth daily.   30 capsule   5   . OVER THE COUNTER MEDICATION   Nasal   Place 1-2 sprays into the nose daily as needed. "Prumist", for nasal congestion         . TAMSULOSIN HCL 0.4 MG PO CAPS   Oral   Take 0.4 mg by mouth daily.             BP 127/81  Pulse 80  Temp 98.8 F (37.1 C) (Oral)  Resp 19  SpO2 97%  Physical Exam CONSTITUTIONAL: Well developed/well nourished HEAD AND FACE: Normocephalic/atraumatic EYES: EOMI/PERRL ENMT: Mucous membranes moist NECK: supple no meningeal signs SPINE:entire spine nontender CV: S1/S2 noted, no murmurs/rubs/gallops noted LUNGS: Lungs are clear to auscultation bilaterally, no apparent distress ABDOMEN: soft, nontender, no rebound or guarding GU:no cva tenderness NEURO: Pt is awake/alert, moves all extremitiesx4 EXTREMITIES: pulses normal, full ROM, no edema noted.  No calf tenderness SKIN: warm, color normal PSYCH: no abnormalities of mood noted  ED Course  Procedures  Labs Reviewed  BASIC METABOLIC PANEL - Abnormal; Notable for the following:    Glucose, Bld 110 (*)     GFR calc non Af  Amer 71 (*)     GFR calc Af Amer 82 (*)  All other components within normal limits  CBC  POCT I-STAT TROPONIN I   Dg Chest 2 View  09/18/2012  *RADIOLOGY REPORT*  Clinical Data: Heart and chest pain.  CHEST - 2 VIEW  Comparison: 08/17/2011.  Findings: Normal sized heart.  Clear lungs.  Thoracic spine degenerative changes, including changes of DISH.  IMPRESSION: No acute abnormality.   Original Report Authenticated By: Beckie Salts, M.D.    Heart rate already improved on my exam.  He is well appearing.  EKG shows sinus rhythm.  He reports he has worn holter previously for similar symptoms but no diagnosis.  Does not appear in afib.  Doubt ACS/PE.  I agreed to give him one extra dose of metoprolol here, and will f/u with his cardiologist tomorrow about any further med adjustments   MDM  Nursing notes including past medical history and social history reviewed and considered in documentation Labs/vital reviewed and considered xrays reviewed and considered Previous records reviewed and considered - phone records reviewed        Date: 09/18/2012  Rate: 101  Rhythm: sinus tachycardia  QRS Axis: normal  Intervals: normal  ST/T Wave abnormalities: normal  Conduction Disutrbances:none  Narrative Interpretation:   Old EKG Reviewed: unchanged    Joya Gaskins, MD 09/19/12 0003

## 2012-09-18 NOTE — Telephone Encounter (Signed)
New Problem:    Patient called in needing a refill of his clopidogrel (PLAVIX) 75 MG tablet.

## 2012-09-18 NOTE — Telephone Encounter (Signed)
Follow-up:   Left his cell phone number.

## 2012-09-18 NOTE — ED Notes (Signed)
Presents with feeling jittery, Tachycardia of 105-110. Recently in hospital Oct 2-5 due to CVA, pt is on plavix and ASA at this time.  Pt is alert and oriented, Denies SOB. C/o headaches in the morning that tend to go away. No weakness or facial droop. Pt c/o intermittent nausea since October.

## 2012-09-18 NOTE — Telephone Encounter (Signed)
Pt called this evening stating that since about noon today, his HR has been elevated in the low 100's to 1-teens.  Other than palpitations, he is asymptomatic.  He suffered a stroke earlier this fall and wore a holter monitor that did not show any atrial fibrillation.  He is on asa and plavix.  I've recommended that he come into the ED for an ECG at which point we can document his rhythm and thus determine if he needs further anticoagulation, adjust his beta blocker, plus/minus admission.  He plans to come into the ED tonight.

## 2012-09-18 NOTE — Telephone Encounter (Signed)
Opened in Error.

## 2012-09-18 NOTE — Telephone Encounter (Signed)
Fax Received. Refill Completed. Rudolpho Claxton Chowoe (R.M.A)   

## 2012-09-19 ENCOUNTER — Telehealth: Payer: Self-pay | Admitting: Cardiovascular Disease

## 2012-09-19 NOTE — Telephone Encounter (Signed)
New Problem:    Patient called in because he went to the ER last night for tachycardia and was instructed to call and check in to see if Dr. Elease Hashimoto wanted to see him.

## 2012-09-19 NOTE — Telephone Encounter (Signed)
He should remain on Plavix.  I will pass along any further info if I hear from Dr. Pearlean Brownie

## 2012-09-19 NOTE — Telephone Encounter (Signed)
Please review use of plavix and metoprolol, what dose you would like him to be on?

## 2012-09-19 NOTE — Telephone Encounter (Signed)
Pt had fast HR up to 107 - 109. Was advised to go to ER, report states they gave him an extra dose of metoprolol, all other tests were normal/ negative. Do you recommend medication increase? Pt wanted to know if you spoke with Dr Pearlean Brownie about plavix for stroke prevention? Please advise.

## 2012-09-19 NOTE — Telephone Encounter (Signed)
I agree with increasing his metoprolol.  I have not spoken to Dr. Pearlean Brownie.

## 2012-09-20 NOTE — Telephone Encounter (Signed)
Spoke to pt.  Advised that we are waiting to hear back from Dr. Pearlean Brownie.

## 2012-09-20 NOTE — Telephone Encounter (Signed)
Pt calling back to get message re his medication , pls call [phone]

## 2012-10-06 NOTE — Telephone Encounter (Signed)
Sounds like he is doing better.  No change in therapy at this point.  Thanks.

## 2012-10-06 NOTE — Telephone Encounter (Signed)
Pt has not had further episodes and is doing better, he is on metoprolol xl 50 mg daily. bp average 106/64 - 134/78 pulse range 61-83. Pt has f/u app set.

## 2012-10-06 NOTE — Telephone Encounter (Signed)
His current dose on the notes is toprol 50 QD.  Is he still having symptoms or episodes of tachycardia.?  Can you get back to me with his medication doses and any symptoms.  These original notes are  27 weeks old and he may not be having any further problems.

## 2012-10-10 ENCOUNTER — Other Ambulatory Visit: Payer: Self-pay | Admitting: Specialist

## 2012-10-10 DIAGNOSIS — R0781 Pleurodynia: Secondary | ICD-10-CM

## 2012-10-12 ENCOUNTER — Ambulatory Visit
Admission: RE | Admit: 2012-10-12 | Discharge: 2012-10-12 | Disposition: A | Discharge status: Home / Self Care | Payer: Medicare Other | Source: Ambulatory Visit | Attending: Specialist | Admitting: Specialist

## 2012-10-12 DIAGNOSIS — R0781 Pleurodynia: Secondary | ICD-10-CM

## 2012-10-13 ENCOUNTER — Other Ambulatory Visit: Payer: Self-pay | Admitting: Specialist

## 2012-10-31 ENCOUNTER — Encounter (HOSPITAL_COMMUNITY): Payer: Self-pay | Admitting: Unknown Physician Specialty

## 2012-10-31 ENCOUNTER — Emergency Department (HOSPITAL_COMMUNITY)
Admission: EM | Admit: 2012-10-31 | Discharge: 2012-10-31 | Disposition: A | Discharge status: Home / Self Care | Payer: Medicare Other | Attending: Emergency Medicine | Admitting: Emergency Medicine

## 2012-10-31 ENCOUNTER — Emergency Department (HOSPITAL_COMMUNITY): Payer: Medicare Other

## 2012-10-31 DIAGNOSIS — I2510 Atherosclerotic heart disease of native coronary artery without angina pectoris: Secondary | ICD-10-CM | POA: Insufficient documentation

## 2012-10-31 DIAGNOSIS — Z9861 Coronary angioplasty status: Secondary | ICD-10-CM | POA: Insufficient documentation

## 2012-10-31 DIAGNOSIS — IMO0002 Reserved for concepts with insufficient information to code with codable children: Secondary | ICD-10-CM | POA: Insufficient documentation

## 2012-10-31 DIAGNOSIS — Z8701 Personal history of pneumonia (recurrent): Secondary | ICD-10-CM | POA: Insufficient documentation

## 2012-10-31 DIAGNOSIS — Z7982 Long term (current) use of aspirin: Secondary | ICD-10-CM | POA: Insufficient documentation

## 2012-10-31 DIAGNOSIS — M129 Arthropathy, unspecified: Secondary | ICD-10-CM | POA: Insufficient documentation

## 2012-10-31 DIAGNOSIS — M19019 Primary osteoarthritis, unspecified shoulder: Secondary | ICD-10-CM | POA: Insufficient documentation

## 2012-10-31 DIAGNOSIS — Z8709 Personal history of other diseases of the respiratory system: Secondary | ICD-10-CM | POA: Insufficient documentation

## 2012-10-31 DIAGNOSIS — Z7902 Long term (current) use of antithrombotics/antiplatelets: Secondary | ICD-10-CM | POA: Insufficient documentation

## 2012-10-31 DIAGNOSIS — I4891 Unspecified atrial fibrillation: Secondary | ICD-10-CM | POA: Insufficient documentation

## 2012-10-31 DIAGNOSIS — E785 Hyperlipidemia, unspecified: Secondary | ICD-10-CM | POA: Insufficient documentation

## 2012-10-31 DIAGNOSIS — I10 Essential (primary) hypertension: Secondary | ICD-10-CM | POA: Insufficient documentation

## 2012-10-31 DIAGNOSIS — Z8739 Personal history of other diseases of the musculoskeletal system and connective tissue: Secondary | ICD-10-CM | POA: Insufficient documentation

## 2012-10-31 DIAGNOSIS — Z87891 Personal history of nicotine dependence: Secondary | ICD-10-CM | POA: Insufficient documentation

## 2012-10-31 DIAGNOSIS — Z8673 Personal history of transient ischemic attack (TIA), and cerebral infarction without residual deficits: Secondary | ICD-10-CM | POA: Insufficient documentation

## 2012-10-31 DIAGNOSIS — K219 Gastro-esophageal reflux disease without esophagitis: Secondary | ICD-10-CM | POA: Insufficient documentation

## 2012-10-31 DIAGNOSIS — Z9889 Other specified postprocedural states: Secondary | ICD-10-CM | POA: Insufficient documentation

## 2012-10-31 DIAGNOSIS — Z8679 Personal history of other diseases of the circulatory system: Secondary | ICD-10-CM | POA: Insufficient documentation

## 2012-10-31 LAB — POCT I-STAT, CHEM 8
BUN: 30 mg/dL — ABNORMAL HIGH (ref 6–23)
Calcium, Ion: 1.24 mmol/L (ref 1.13–1.30)
Chloride: 106 mEq/L (ref 96–112)
Creatinine, Ser: 1.2 mg/dL (ref 0.50–1.35)
Glucose, Bld: 104 mg/dL — ABNORMAL HIGH (ref 70–99)
HCT: 43 % (ref 39.0–52.0)
Hemoglobin: 14.6 g/dL (ref 13.0–17.0)
Potassium: 4.7 mEq/L (ref 3.5–5.1)
Sodium: 141 mEq/L (ref 135–145)
TCO2: 26 mmol/L (ref 0–100)

## 2012-10-31 LAB — POCT I-STAT TROPONIN I: Troponin i, poc: 0 ng/mL (ref 0.00–0.08)

## 2012-10-31 NOTE — ED Provider Notes (Addendum)
History     CSN: [id number]  Arrival date & time 10/31/12  1213   First MD Initiated Contact with Patient 10/31/12 1216      Chief Complaint  Patient presents with  . Palpitations    (Consider location/radiation/quality/duration/timing/severity/associated sxs/prior treatment) HPI Comments: When EMS arrived patient was found to be in A. fib RVR. They gave him 2 IV boluses of Cardizem with resolution of his symptoms and went back into a sinus rhythm  Patient is a 67 y.o. male presenting with palpitations. The history is provided by the patient.  Palpitations  This is a recurrent problem. The current episode started less than 1 hour ago. The problem occurs constantly. The problem has been resolved. Associated with: had walked up the stairs to get something before going to the gym and then developed palpations and pain between the shoulder blades. On average, each episode lasts 30 minutes. Associated symptoms include irregular heartbeat. Pertinent negatives include no diaphoresis, no near-syncope, no syncope, no abdominal pain, no nausea, no vomiting, no back pain, no lower extremity edema, no weakness and no cough. He has tried deep relaxation for the symptoms. The treatment provided no relief. His past medical history does not include heart disease.    Past Medical History  Diagnosis Date  . Hypertension   . Dyslipidemia   . Dyspnea   . Chest pain     Sees Dr. Elease Hashimoto, saw last April 2012  . SOB (shortness of breath)   . GERD (gastroesophageal reflux disease)   . Complication of anesthesia     does have small airway  . PONV (postoperative nausea and vomiting)   . Recurrent upper respiratory infection (URI)     3months ago, treated with claritin D and 5 day dose pack  . Pneumonia     1992  . Sleep apnea     cpap  . Arthritis     back, neck, shoulders  . Coronary artery disease     STATUS POST PTCA AND STENTING OF HIS RIGHT CORONARY ARTERY  . CVA (cerebral infarction)      Past Surgical History  Procedure Date  . Cardiac catheterization 12/23/2005    NORMAL CHAMBER SIZE AND NORMAL GLOBAL SYSTOLIC FUNCTION. EF 65%  . Cardiac catheterization 12/24/2004    NORMAL LEFT VENTRICULAR SYSTOLIC FUNCTION.THE LEFT VENTRICLE APPEARS TO BE MILDLY ENLARGED.  . Cardiac catheterization 05/27/2003    MILD CARDIOMEGALY. EF 55-60%  . Cervical fusion 07/2001  . Lumbar fusion 10/2003    L5-S1  . Knee arthroscopy 10/2004    LEFT KNEE  . Replacement total knee     left  . Tonsillectomy   . Coronary stent placement     first Aug 2004, 2nd stent 12/2005  . Cardiovascular stress test     2011  . US echocardiography     2011  . Tee without cardioversion 07/07/2012    Procedure: TRANSESOPHAGEAL ECHOCARDIOGRAM (TEE);  Surgeon: Pricilla Riffle, MD;  Location: [facility] ENDOSCOPY;  Service: Cardiovascular;  Laterality: N/A;    Family History  Problem Relation Age of Onset  . Hypertension Mother   . Heart disease Father     History  Substance Use Topics  . Smoking status: Former Smoker -- 1.0 packs/day for 20 years    Types: Cigarettes    Quit date: 10/04/2002  . Smokeless tobacco: Never Used  . Alcohol Use: No      Review of Systems  Constitutional: Negative for diaphoresis.  Respiratory: Negative for cough.  Cardiovascular: Positive for palpitations. Negative for syncope and near-syncope.  Gastrointestinal: Negative for nausea, vomiting and abdominal pain.  Musculoskeletal: Negative for back pain.  Neurological: Negative for weakness.  All other systems reviewed and are negative.    Allergies  Codeine; Monopril; and Ampicillin  Home Medications   Current Outpatient Rx  Name  Route  Sig  Dispense  Refill  . ASPIRIN 325 MG PO TABS   Oral   Take 325 mg by mouth daily.           . ATORVASTATIN CALCIUM 40 MG PO TABS   Oral   Take 1 tablet (40 mg total) by mouth daily.   30 tablet   5   . CLOPIDOGREL BISULFATE 75 MG PO TABS   Oral   Take 75 mg by mouth  daily.         . FENOFIBRATE 160 MG PO TABS   Oral   Take 160 mg by mouth daily.         Marland Kitchen FLUTICASONE PROPIONATE 50 MCG/ACT NA SUSP   Nasal   Place 2 sprays into the nose daily.           . IBUPROFEN 800 MG PO TABS   Oral   Take 800 mg by mouth every 8 (eight) hours as needed. For pain         . LISINOPRIL 20 MG PO TABS   Oral   Take 1 tablet (20 mg total) by mouth daily.   30 tablet   5   . METOPROLOL SUCCINATE ER 50 MG PO TB24   Oral   Take 1 tablet (50 mg total) by mouth daily.   30 tablet   5   . THERA M PLUS PO TABS   Oral   Take 1 tablet by mouth daily.           Marland Kitchen NITROGLYCERIN 0.4 MG SL SUBL   Sublingual   Place 1 tablet (0.4 mg total) under the tongue every 5 (five) minutes as needed. For chest pain   25 tablet   5   . OMEPRAZOLE 10 MG PO CPDR   Oral   Take 1 capsule (10 mg total) by mouth daily.   30 capsule   5   . OVER THE COUNTER MEDICATION   Nasal   Place 1-2 sprays into the nose daily as needed. "Pure-mist", for nasal congestion         . TAMSULOSIN HCL 0.4 MG PO CAPS   Oral   Take 0.4 mg by mouth daily.             BP 139/77  Resp 18  SpO2 96%  Physical Exam  Nursing note and vitals reviewed. Constitutional: He is oriented to person, place, and time. He appears well-developed and well-nourished. No distress.  HENT:  Head: Normocephalic and atraumatic.  Mouth/Throat: Oropharynx is clear and moist.  Eyes: Conjunctivae normal and EOM are normal. Pupils are equal, round, and reactive to light.  Neck: Normal range of motion. Neck supple.  Cardiovascular: Normal rate, regular rhythm and intact distal pulses.   No murmur heard. Pulmonary/Chest: Effort normal and breath sounds normal. No respiratory distress. He has no wheezes. He has no rales.  Abdominal: Soft. He exhibits no distension. There is no tenderness. There is no rebound and no guarding.  Musculoskeletal: Normal range of motion. He exhibits no edema and no  tenderness.  Neurological: He is alert and oriented to person, place, and time.  Skin:  Skin is warm and dry. No rash noted. No erythema.  Psychiatric: He has a normal mood and affect. His behavior is normal.    ED Course  Procedures (including critical care time)  Labs Reviewed  POCT I-STAT, CHEM 8 - Abnormal; Notable for the following:    BUN 30 (*)     Glucose, Bld 104 (*)     All other components within normal limits  POCT I-STAT TROPONIN I   Dg Chest Port 1 View  10/31/2012  *RADIOLOGY REPORT*  Clinical Data:  Atrial fibrillation  PORTABLE CHEST - 1 VIEW  Comparison: Prior chest x-ray 09/18/2012  Findings: Stable mild cardiomegaly.  The lungs are well-aerated. Negative for pulmonary edema, focal consolidation pleural effusion or pneumothorax.  No acute osseous abnormality.  IMPRESSION: No acute cardiopulmonary disease.   Original Report Authenticated By: Malachy Moan, M.D.      Date: 10/31/2012  Rate: 100  Rhythm: normal sinus rhythm  QRS Axis: normal  Intervals: normal  ST/T Wave abnormalities: normal  Conduction Disutrbances: none  Narrative Interpretation: unremarkable     1. Atrial fibrillation       MDM   Patient with a history of intermittent palpitations in the past which started again today when EMS arrived he was in A. fib RVR with a rate of 140-150. EMS gave him 2 IV boluses of 10 mg Cardizem and when the patient arrived here he was back in sinus rhythm between 90 and 100 beats per minute.  Patient denied any shortness of breath, vision abnormalities, near-syncope, diaphoresis. When the episode started he states he has some mild pain between her shoulder blades which resolved. He is now asymptomatic. EMS to capture the A. fib RVR the EKG. Here his EKG is normal sinus rhythm at a rate of 100. He is resting comfortably in the room and is asymptomatic. I-STAT and chest x-ray are within normal limits. It will discuss with cardiology however patient does take  metoprolol daily we'll see if he wants any changes in medication.  Also patient is currently taking Plavix and aspirin and he has a followup appointment with his neurologist on Monday and they can discuss whether they want him on any stronger anti-coagulation.  1:41 PM Spoke with Dr. Elease Hashimoto pt's cardiologist and pt will increase metoprolol to 100mg  daily and f/u.      Gwyneth Sprout, MD 10/31/12 1341  Gwyneth Sprout, MD 10/31/12 1343

## 2012-10-31 NOTE — ED Notes (Signed)
Patient was getting ready to go to the [facility] to work out went he tried to take his BP and kept getting an error message. He felt his pulse and it was irregular. He had an ache in his chest and back. He states he thought it was his arthritis. EMS was called and found patient to be in AFib RVR 140. Patient received Cardizem 10mg  IVP x2. Rate decreased to 120.

## 2012-10-31 NOTE — ED Notes (Signed)
Upon arrived to room patient was put on cardiac monitor and was in NSR.

## 2012-11-01 ENCOUNTER — Telehealth: Payer: Self-pay | Admitting: Cardiovascular Disease

## 2012-11-01 MED ORDER — METOPROLOL SUCCINATE ER 100 MG PO TB24: 100.0000 mg | ORAL_TABLET | Freq: Every day | ORAL | Status: DC

## 2012-11-01 MED ORDER — LISINOPRIL 20 MG PO TABS: 20.0000 mg | ORAL_TABLET | Freq: Every day | ORAL | Status: DC

## 2012-11-01 NOTE — Telephone Encounter (Signed)
Pt went to ED last night and they changed his Metoprolol 100 mg QD and he also lisinopril 20 mg QD called into [facility] on [address] [phone] and he needs this done asap

## 2012-11-01 NOTE — Telephone Encounter (Signed)
Follow-up:    Patient called in to tell you that he has been taking lisinopril since 2005 and Dr. Elease Hashimoto upped his does of his Metoprolol when he was in the ER yesterday.

## 2012-11-01 NOTE — Telephone Encounter (Signed)
Noted  

## 2012-11-01 NOTE — Telephone Encounter (Signed)
MAR adjusted, meds reordered, asked pt to call back to update meds- unsure if he is taking lisinopril as prescribed or if he is missing doses, told him he could dbl up on his metoprolol till bottle gone, new order sent.

## 2012-11-02 ENCOUNTER — Other Ambulatory Visit: Payer: Self-pay | Admitting: *Deleted

## 2012-11-02 NOTE — Telephone Encounter (Signed)
Opened in Error.

## 2012-11-18 ENCOUNTER — Other Ambulatory Visit: Payer: Self-pay

## 2012-11-27 ENCOUNTER — Ambulatory Visit: Payer: Medicare Other | Admitting: Cardiovascular Disease

## 2012-11-27 ENCOUNTER — Other Ambulatory Visit: Payer: Self-pay | Admitting: *Deleted

## 2012-11-30 ENCOUNTER — Encounter: Payer: Self-pay | Admitting: *Deleted

## 2012-11-30 MED ORDER — RIVAROXABAN 20 MG PO TABS: 20.0000 mg | ORAL_TABLET | Freq: Every day | ORAL | Status: DC

## 2012-11-30 MED ORDER — ASPIRIN 81 MG PO TABS: 81.0000 mg | ORAL_TABLET | Freq: Every day | ORAL | Status: DC

## 2012-11-30 NOTE — Telephone Encounter (Signed)
New Prob    Pt states he was diagnosed with afib. Requesting a stronger blood thinner per Dr. Veneta Penton. Would like to speak to nurse.

## 2012-11-30 NOTE — Telephone Encounter (Signed)
DR NAHSER REVIEWED CHART/ STOP PLAVIX, REDUCE ASA TO 81 MG AND START XARELTO 20 MG DAILY, PT AWARE.

## 2012-12-01 ENCOUNTER — Telehealth: Payer: Self-pay | Admitting: Cardiovascular Disease

## 2012-12-01 NOTE — Telephone Encounter (Signed)
New Prob    Pt has questions regarding metoprolol.

## 2012-12-01 NOTE — Telephone Encounter (Signed)
Confirmed current dose of metoprolol, dr Elease Hashimoto was aware of the increase.

## 2012-12-07 ENCOUNTER — Telehealth: Payer: Self-pay | Admitting: *Deleted

## 2012-12-07 ENCOUNTER — Telehealth: Payer: Self-pay | Admitting: Cardiovascular Disease

## 2012-12-07 NOTE — Telephone Encounter (Signed)
Walk In pt Form " [employer] [facility] " Form Dropped Off pt Needs Completed Sent to [facility] 12/07/12/KM

## 2012-12-07 NOTE — Telephone Encounter (Signed)
Pt dropped off papers to get PA for xarelto 20 mg, papers completed and faxed.

## 2012-12-18 ENCOUNTER — Encounter: Payer: Self-pay | Admitting: Cardiovascular Disease

## 2012-12-21 ENCOUNTER — Telehealth: Payer: Self-pay | Admitting: Cardiovascular Disease

## 2012-12-21 ENCOUNTER — Telehealth: Payer: Self-pay | Admitting: *Deleted

## 2012-12-21 NOTE — Telephone Encounter (Signed)
While working on PA with [facility]/ [facility]  Samples of xarelto 20 mg were provided. Pt aware.

## 2012-12-21 NOTE — Telephone Encounter (Signed)
Walk in pt Form " pt Needs Refills" Sent To [facility]" 12/21/12/KM

## 2013-01-05 ENCOUNTER — Encounter: Payer: Self-pay | Admitting: Cardiovascular Disease

## 2013-01-05 ENCOUNTER — Other Ambulatory Visit: Payer: Self-pay | Admitting: *Deleted

## 2013-01-05 ENCOUNTER — Ambulatory Visit (INDEPENDENT_AMBULATORY_CARE_PROVIDER_SITE_OTHER): Payer: Medicare Other | Admitting: Cardiovascular Disease

## 2013-01-05 ENCOUNTER — Other Ambulatory Visit (INDEPENDENT_AMBULATORY_CARE_PROVIDER_SITE_OTHER): Payer: Medicare Other

## 2013-01-05 VITALS — BP 124/72 | HR 67 | Ht 74.0 in | Wt 258.8 lb

## 2013-01-05 DIAGNOSIS — E785 Hyperlipidemia, unspecified: Secondary | ICD-10-CM

## 2013-01-05 DIAGNOSIS — I2510 Atherosclerotic heart disease of native coronary artery without angina pectoris: Secondary | ICD-10-CM

## 2013-01-05 DIAGNOSIS — I4891 Unspecified atrial fibrillation: Secondary | ICD-10-CM

## 2013-01-05 DIAGNOSIS — I480 Paroxysmal atrial fibrillation: Secondary | ICD-10-CM | POA: Insufficient documentation

## 2013-01-05 LAB — HEPATIC FUNCTION PANEL
ALT: 27 U/L (ref 0–53)
AST: 25 U/L (ref 0–37)
Albumin: 3.8 g/dL (ref 3.5–5.2)
Alkaline Phosphatase: 44 U/L (ref 39–117)
Bilirubin, Direct: 0.1 mg/dL (ref 0.0–0.3)
Total Bilirubin: 0.6 mg/dL (ref 0.3–1.2)
Total Protein: 6.7 g/dL (ref 6.0–8.3)

## 2013-01-05 LAB — BASIC METABOLIC PANEL
BUN: 24 mg/dL — ABNORMAL HIGH (ref 6–23)
CO2: 25 mEq/L (ref 19–32)
Calcium: 8.7 mg/dL (ref 8.4–10.5)
Chloride: 106 mEq/L (ref 96–112)
Creatinine, Ser: 1.2 mg/dL (ref 0.4–1.5)
GFR: 67.06 mL/min (ref >60.00)
Glucose, Bld: 104 mg/dL — ABNORMAL HIGH (ref 70–99)
Potassium: 4.5 mEq/L (ref 3.5–5.1)
Sodium: 137 mEq/L (ref 135–145)

## 2013-01-05 NOTE — Assessment & Plan Note (Signed)
Corey [name] is stable  - no angina.  Encouraged him to exercise

## 2013-01-05 NOTE — Assessment & Plan Note (Signed)
Wayne has been found to have intermittent atrial fibrillation. He's had a stroke. He is recovered quite nicely. We'll continue with the Xarelto 20 mg daily.

## 2013-01-05 NOTE — Progress Notes (Signed)
Corey [name] Date of Birth  1946-04-07       [facility] Office [address]  [address] [place], [place]  [address]   [place], [place]  [address] [phone]     [phone]   Fax  [phone]    Fax [phone]  Problem List: 1. Coronary artery disease-status post PTCA and stenting of his right coronary artery 2. Hypertension 3. Hyperlipidemia 4. 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 Atrial fibrillation - discovered after his stroke  History of Present Illness: Corey [name] is a 67 yo gentleman with a history of coronary artery disease. Status post PTCA and stenting of his right coronary artery. He also has a history of hypertension, and hyperlipidemia.  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  He had a stroke in Oct.  He had some speech problems and loss of fine motor control of the right arm.  TEE was negative for PFO or LAA thrombus.   Event monitor showed no A-Fib.  He has been on Plavix since his stents in  January 05, 2013:  Corey [name] had an episode of atrial fib in January.  He was started on Xarelto and has done well.  He has had some fatigue on occasion.  He monitors his BP.    He has recovered from his stroke.    Current Outpatient Prescriptions on File Prior to Visit  Medication Sig Dispense Refill  . aspirin 81 MG tablet Take 1 tablet (81 mg total) by mouth daily.      Marland Kitchen atorvastatin (LIPITOR) 40 MG tablet Take 1 tablet (40 mg total) by mouth daily.  30 tablet  5  . fenofibrate 160 MG tablet Take 160 mg by mouth daily.      . fluticasone (FLONASE) 50 MCG/ACT nasal spray Place 2 sprays into the nose daily.        Marland Kitchen ibuprofen (ADVIL,MOTRIN) 800 MG tablet Take 800 mg by mouth every 8 (eight) hours as needed. For pain      . lisinopril (PRINIVIL,ZESTRIL) 20 MG tablet Take 1 tablet (20 mg total) by mouth daily.  30 tablet  5  . metoprolol succinate (TOPROL-XL) 100 MG 24 hr tablet Take 1 tablet (100 mg  total) by mouth daily.  30 tablet  5  . Multiple Vitamins-Minerals (MULTIVITAMINS THER. W/MINERALS) TABS Take 1 tablet by mouth daily.        . nitroGLYCERIN (NITROSTAT) 0.4 MG SL tablet Place 1 tablet (0.4 mg total) under the tongue every 5 (five) minutes as needed. For chest pain  25 tablet  5  . omeprazole (PRILOSEC) 10 MG capsule Take 1 capsule (10 mg total) by mouth daily.  30 capsule  5  . OVER THE COUNTER MEDICATION Place 1-2 sprays into the nose daily as needed. "Pure-mist", for nasal congestion      . promethazine (PHENERGAN) 25 MG tablet Take 25 mg by mouth every 6 (six) hours as needed.       . Rivaroxaban (XARELTO) 20 MG TABS Take 1 tablet (20 mg total) by mouth daily.  30 tablet  11  . Tamsulosin HCl (FLOMAX) 0.4 MG CAPS Take 0.4 mg by mouth daily.         No current facility-administered medications on file prior to visit.    Allergies  Allergen Reactions  . Codeine     Heavy amounts cause nausea  . Monopril (  Fosinopril Sodium)     Muscle aches & pain  . Ampicillin Rash    Past Medical History  Diagnosis Date  . Hypertension   . Dyslipidemia   . Dyspnea   . Chest pain     Sees Dr. Elease Hashimoto, saw last April 2012  . SOB (shortness of breath)   . GERD (gastroesophageal reflux disease)   . Complication of anesthesia     does have small airway  . PONV (postoperative nausea and vomiting)   . Recurrent upper respiratory infection (URI)     3months ago, treated with claritin D and 5 day dose pack  . Pneumonia     1992  . Sleep apnea     cpap  . Arthritis     back, neck, shoulders  . Coronary artery disease     STATUS POST PTCA AND STENTING OF HIS RIGHT CORONARY ARTERY  . CVA (cerebral infarction)     Past Surgical History  Procedure Laterality Date  . Cardiac catheterization  12/23/2005    NORMAL CHAMBER SIZE AND NORMAL GLOBAL SYSTOLIC FUNCTION. EF 65%  . Cardiac catheterization  12/24/2004    NORMAL LEFT VENTRICULAR SYSTOLIC FUNCTION.THE LEFT VENTRICLE APPEARS TO  BE MILDLY ENLARGED.  . Cardiac catheterization  05/27/2003    MILD CARDIOMEGALY. EF 55-60%  . Cervical fusion  07/2001  . Lumbar fusion  10/2003    L5-S1  . Knee arthroscopy  10/2004    LEFT KNEE  . Replacement total knee      left  . Tonsillectomy    . Coronary stent placement      first Aug 2004, 2nd stent 12/2005  . Cardiovascular stress test      2011  . US echocardiography      2011  . Tee without cardioversion  07/07/2012    Procedure: TRANSESOPHAGEAL ECHOCARDIOGRAM (TEE);  Surgeon: Pricilla Riffle, MD;  Location: [facility] ENDOSCOPY;  Service: Cardiovascular;  Laterality: N/A;    History  Smoking status  . Former Smoker -- 1.00 packs/day for 20 years  . Types: Cigarettes  . Quit date: 10/04/2002  Smokeless tobacco  . Never Used    History  Alcohol Use No    Family History  Problem Relation Age of Onset  . Hypertension Mother   . Heart disease Father     Reviw of Systems:  Reviewed in the HPI.  All other systems are negative.  Physical Exam: Blood pressure 124/72, pulse 67, height 6\' 2"  (1.88 m), weight 258 lb 12.8 oz (117.391 kg), SpO2 97.00%. General: Well developed, well nourished, in no acute distress.  Head: Normocephalic, atraumatic, sclera non-icteric, mucus membranes are moist,   Neck: Supple. Carotids are 2 + without bruits. No JVD  Lungs: Clear bilaterally to auscultation.  Heart: regular rate.  normal  S1 S2. No murmurs, gallops or rubs.  Abdomen: Soft, non-tender, non-distended with normal bowel sounds. No hepatomegaly. No rebound/guarding. No masses.  Msk:  Strength and tone are normal  Extremities: No clubbing or cyanosis. No edema.  Distal pedal pulses are 2+ and equal bilaterally.  Neuro: Alert and oriented X 3. Moves all extremities spontaneously.  Psych:  Responds to questions appropriately with a normal affect.  ECG: 08/22/2012-normal sinus rhythm at 60 beats a minute. EKG is normal.  Assessment / Plan:

## 2013-01-05 NOTE — Patient Instructions (Addendum)
  [facility] Low Glycemic Diet (Source: [facility], 2006) Low Glycemic Foods (20-49) (Decrease risk of developing heart disease) Breakfast Cereals: All-Bran All-Bran Fruit 'n Oats Fiber One Oatmeal (not instant) Oat bran Fruits and fruit juices: (Limit to 1-2 servings per day) Apples Apricots (fresh & dried) Blackberries Blueberries Cherries Cranberries Peaches Pears Plums Prunes Grapefruit Raspberries Strawberries Tangerine Apple juice Grapefruit juice Tomato juice Beans and legumes (fresh-cooked): Black-eyed peas Butter beans Chick peas Lentils  Green beans Lima beans Kidney beans Navy beans Pinto beans Snow peas Non-starchy vegetables: Asparagus, avocado, broccoli, cabbage, cauliflower, celery, cucumber, greens, lettuce, mushrooms, peppers, tomatoes, okra, onions, spinach, summer squash Grains: Barley Bulgur Rye Wild rice Nuts and oils : Almonds Peanuts Sunflower seeds Hazelnuts Pecans Walnuts Oils that are liquid at room temperature Dairy, fish, meat, soy, and eggs: Milk, skim Lowfat cheese Yogurt, lowfat, fruit sugar sweetened Lean red meat Fish  Skinless chicken & [place] Shellfish Egg whites (up to 3 daily) Soy products  Egg yolks (up to 7 or _____ per week) Moderate Glycemic Foods (50-69) Breakfast Cereals: Bran Buds Bran Chex Just Right Mini-Wheats  Special K Swiss muesli Fruits: Banana (under-ripe) Dates Figs Grapes Kiwi Mango Oranges Raisins Fruit Juices: Cranberry juice Orange juice Beans and legumes: Boston-type baked beans Canned pinto, kidney, or navy beans Green peas Vegetables: Beets Carrots  Sweet potato Yam Corn on the cob Breads: Pita (pocket) bread Oat bran bread Pumpernickel bread Rye bread Wheat bread, high fiber  Grains: Cornmeal Rice, brown Rice, white Couscous Pasta: Macaroni Pizza, cheese Ravioli, meat filled Spaghetti, white  Nuts: Cashews Macadamia Snacks: Chocolate Ice cream, lowfat Muffin  Popcorn High Glycemic Foods (70-100)  Breakfast Cereals: Cheerios Corn Chex Corn Flakes Cream of Wheat Grape Nuts Grape Nut Flakes Grits Nutri-Grain Puffed Rice Puffed Wheat Rice Chex Rice Krispies Shredded Wheat Team Total Fruits: Pineapple Watermelon Banana (over-ripe) Beverages: Sodas, sweet tea, pineapple juice Vegetables: Potato, baked, boiled, fried, mashed Jamaica fries Canned or frozen corn Parsnips Winter squash Breads: Most breads (white and whole grain) Bagels Bread sticks Bread stuffing Kaiser roll Dinner rolls Grains: Rice, instant Tapioca, with milk Candy and most cookies Snacks: Donuts Corn chips Jelly beans Pretzels Pastries     Your physician wants you to follow-up in: 6 MONTHS You will receive a reminder letter in the mail two months in advance. If you don't receive a letter, please call our office to schedule the follow-up appointment.  Your physician recommends that you continue on your current medications as directed. Please refer to the Current Medication list given to you today.

## 2013-01-05 NOTE — Progress Notes (Signed)
Lab order placed.

## 2013-01-08 ENCOUNTER — Other Ambulatory Visit: Payer: Self-pay | Admitting: *Deleted

## 2013-01-08 DIAGNOSIS — I2510 Atherosclerotic heart disease of native coronary artery without angina pectoris: Secondary | ICD-10-CM

## 2013-01-08 DIAGNOSIS — E785 Hyperlipidemia, unspecified: Secondary | ICD-10-CM

## 2013-01-08 LAB — NMR LIPOPROFILE WITH LIPIDS
Cholesterol, Total: 108 mg/dL (ref <200)
HDL Particle Number: 25.4 umol/L — ABNORMAL LOW (ref >=30.5)
HDL Size: 8.1 nm — ABNORMAL LOW (ref >=9.2)
HDL-C: 29 mg/dL — ABNORMAL LOW (ref >=40)
LDL (calc): 62 mg/dL (ref <100)
LDL Particle Number: 853 nmol/L (ref <1000)
LDL Size: 20 nm — ABNORMAL LOW (ref >20.5)
LP-IR Score: 77 — ABNORMAL HIGH (ref <=45)
Large HDL-P: <1.3 umol/L — ABNORMAL LOW (ref >=4.8)
Large VLDL-P: 2.9 nmol/L — ABNORMAL HIGH (ref <=2.7)
Small LDL Particle Number: 559 nmol/L — ABNORMAL HIGH (ref <=527)
Triglycerides: 84 mg/dL (ref <150)
VLDL Size: 55.3 nm — ABNORMAL HIGH (ref <=46.6)

## 2013-01-08 MED ORDER — OMEPRAZOLE 10 MG PO CPDR: 10.0000 mg | DELAYED_RELEASE_CAPSULE | Freq: Every day | ORAL | Status: DC

## 2013-01-08 NOTE — Telephone Encounter (Signed)
Fax Received. Refill Completed. Corey [name] (R.M.A)   

## 2013-01-31 ENCOUNTER — Other Ambulatory Visit: Payer: Self-pay | Admitting: Family Medicine

## 2013-01-31 DIAGNOSIS — Z136 Encounter for screening for cardiovascular disorders: Secondary | ICD-10-CM

## 2013-02-06 ENCOUNTER — Ambulatory Visit
Admission: RE | Admit: 2013-02-06 | Discharge: 2013-02-06 | Disposition: A | Discharge status: Home / Self Care | Payer: Medicare Other | Source: Ambulatory Visit | Attending: Family Medicine | Admitting: Family Medicine

## 2013-02-06 DIAGNOSIS — Z136 Encounter for screening for cardiovascular disorders: Secondary | ICD-10-CM

## 2013-03-15 ENCOUNTER — Other Ambulatory Visit (HOSPITAL_COMMUNITY): Payer: Self-pay | Admitting: *Deleted

## 2013-03-15 NOTE — Telephone Encounter (Signed)
Opened in Error.

## 2013-03-18 ENCOUNTER — Other Ambulatory Visit: Payer: Self-pay | Admitting: Cardiovascular Disease

## 2013-03-19 NOTE — Telephone Encounter (Signed)
Fax Received. Refill Completed. Corey [name] (R.M.A)   

## 2013-03-26 ENCOUNTER — Other Ambulatory Visit: Payer: Self-pay | Admitting: *Deleted

## 2013-03-26 MED ORDER — ATORVASTATIN CALCIUM 40 MG PO TABS: 40.0000 mg | ORAL_TABLET | Freq: Every day | ORAL | Status: DC

## 2013-04-23 ENCOUNTER — Other Ambulatory Visit: Payer: Self-pay | Admitting: Cardiovascular Disease

## 2013-05-08 ENCOUNTER — Other Ambulatory Visit: Payer: Self-pay | Admitting: Cardiovascular Disease

## 2013-05-09 ENCOUNTER — Other Ambulatory Visit: Payer: Self-pay

## 2013-05-09 MED ORDER — ATORVASTATIN CALCIUM 40 MG PO TABS: 40.0000 mg | ORAL_TABLET | Freq: Every day | ORAL | Status: DC

## 2013-05-09 NOTE — Telephone Encounter (Signed)
Per pt he is taking both his fenofibrate and atorvastatin. Fax Received. Refill Completed. Corey [name] (R.M.A)

## 2013-05-30 ENCOUNTER — Ambulatory Visit (INDEPENDENT_AMBULATORY_CARE_PROVIDER_SITE_OTHER): Payer: Medicare Other | Admitting: Neurology

## 2013-05-30 ENCOUNTER — Encounter: Payer: Self-pay | Admitting: Neurology

## 2013-05-30 VITALS — BP 119/69 | HR 62 | Temp 97.8°F | Ht 73.0 in | Wt 251.0 lb

## 2013-05-30 DIAGNOSIS — I6789 Other cerebrovascular disease: Secondary | ICD-10-CM

## 2013-05-30 NOTE — Progress Notes (Signed)
[name] [address] [place]. [place] [address] [phone]       OFFICE FOLLOW-UP NOTE  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 Vascular risk factors of hypertension, hyperlipidemia, atrial fibrillation and cardiac disease. 05/30/2013 he is seen today for followup of the last visit on 11/10/12. He is tolerating survive to quite well without significant bruising normally occasional minor bleeding. He continues to have mild occasional word finding difficulties particularly when tired. He has not had any recurrent stroke or TIA symptoms. He had carotid ultrasound done in April by Dr. Elease Hashimoto which was fine but I do not have the results. He has no new complaints today. ROS:   14 system review of systems is positive for joint pain, easy bleeding  PMH:  Past Medical History  Diagnosis Date  . Hypertension   . Dyslipidemia   . Dyspnea   . Chest pain     Sees Dr. Elease Hashimoto, saw last April 2012  . SOB (shortness of breath)   . GERD (gastroesophageal reflux disease)   . Complication of anesthesia     does have small airway  . PONV (postoperative nausea and vomiting)   . Recurrent upper respiratory infection (URI)     3months ago, treated with claritin D and 5 day dose pack  . Pneumonia     1992  . Sleep apnea     cpap  . Arthritis     back, neck, shoulders  . Coronary artery disease     STATUS POST PTCA AND STENTING OF HIS RIGHT CORONARY ARTERY  . CVA (cerebral infarction)     Social History:  History   Social History  . Marital Status: Married    Spouse Name: Renee    Number of Children: 3  . Years of Education: 14   Occupational History  . Retired     Disabled   Social History Main Topics  . Smoking status: Former Smoker -- 1.00 packs/day for 20 years    Types: Cigarettes   Quit date: 10/04/2002  . Smokeless tobacco: Never Used  . Alcohol Use: No  . Drug Use: No  . Sexual Activity: Not on file   Other Topics Concern  . Not on file   Social History Narrative   Patient lives at home with spouse.   Caffeine Use:     Medications:   Current Outpatient Prescriptions on File Prior to Visit  Medication Sig Dispense Refill  . aspirin 81 MG tablet Take 1 tablet (81 mg total) by mouth daily.      Marland Kitchen atorvastatin (LIPITOR) 40 MG tablet Take 1 tablet (40 mg total) by mouth daily.  90 tablet  3  . fenofibrate 160 MG tablet TAKE 1 TABLET EVERY DAY  90 tablet  3  . fluticasone (FLONASE) 50 MCG/ACT nasal spray Place 2 sprays into the nose daily.        Marland Kitchen lisinopril (PRINIVIL,ZESTRIL) 20 MG tablet TAKE 1 TABLET (20 MG TOTAL) BY MOUTH DAILY.  30 tablet  5  . metoprolol succinate (TOPROL-XL) 100 MG 24 hr tablet TAKE 1 TABLET (100 MG TOTAL) BY MOUTH DAILY.  30 tablet  5  . Multiple Vitamins-Minerals (MULTIVITAMINS THER. W/MINERALS) TABS Take 1 tablet by mouth daily.        . nitroGLYCERIN (NITROSTAT) 0.4 MG SL tablet Place  1 tablet (0.4 mg total) under the tongue every 5 (five) minutes as needed. For chest pain  25 tablet  5  . omeprazole (PRILOSEC) 10 MG capsule Take 1 capsule (10 mg total) by mouth daily.  30 capsule  5  . OVER THE COUNTER MEDICATION Place 1-2 sprays into the nose daily as needed. "Pure-mist", for nasal congestion      . Rivaroxaban (XARELTO) 20 MG TABS Take 1 tablet (20 mg total) by mouth daily.  30 tablet  11  . Tamsulosin HCl (FLOMAX) 0.4 MG CAPS Take 0.4 mg by mouth daily.         No current facility-administered medications on file prior to visit.    Allergies:   Allergies  Allergen Reactions  . Codeine     Heavy amounts cause nausea  . Monopril [Fosinopril Sodium]     Muscle aches & pain  . Ampicillin Rash    Physical Exam General: well developed, well nourished, seated, in no evident distress Head: head normocephalic and atraumatic.  Orohparynx benign Neck: supple with no carotid or supraclavicular bruits Cardiovascular: regular rate and rhythm, no murmurs Musculoskeletal: no deformity Skin:  no rash/petichiae. Left knee surgical scar from knee replacement. Vascular:  Normal pulses all extremities  Neurologic Exam Mental Status: Awake and fully alert. Oriented to place and time. Recent and remote memory intact. Attention span, concentration and fund of knowledge appropriate. Mood and affect appropriate.  Cranial Nerves: Fundoscopic exam reveals sharp disc margins. Pupils equal, briskly reactive to light. Extraocular movements full without nystagmus. Visual fields full to confrontation. Hearing intact. Facial sensation intact. Face, tongue, palate moves normally and symmetrically.  Motor: Normal bulk and tone. Normal strength in all tested extremity muscles. Sensory.: intact to tough and pinprick and vibratory.  Coordination: Rapid alternating movements normal in all extremities. Finger-to-nose and heel-to-shin performed accurately bilaterally. Gait and Station: Arises from chair without difficulty. Stance is normal. Gait demonstrates normal stride length and balance . Able to heel, toe and tandem walk without difficulty.  Reflexes: 1+ and symmetric. Toes downgoing.     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 Vascular risk factors of hypertension, hyperlipidemia, atrial fibrillation and cardiac disease.     PLAN:  Continue xarelto for stroke prevention and strict control of hypertension with blood pressure goal below 130/90 and diabetes with hemoglobin A1c goal below 6.5% and lipids with LDL cholesterol goal below 71 mg percent. Follow up with Larita Fife, NP in 6 months

## 2013-05-30 NOTE — Patient Instructions (Addendum)
Continue xarelto for stroke prevention and strict control of hypertension with blood pressure goal below 130/90 and diabetes with hemoglobin A1c goal below 6.5% and lipids with LDL cholesterol goal below 71 mg percent. Follow up with Larita Fife, NP in 6 months

## 2013-06-12 ENCOUNTER — Other Ambulatory Visit: Payer: Self-pay | Admitting: Cardiovascular Disease

## 2013-06-14 ENCOUNTER — Other Ambulatory Visit: Payer: Self-pay

## 2013-06-14 MED ORDER — METOPROLOL SUCCINATE ER 100 MG PO TB24: ORAL_TABLET | ORAL | Status: DC

## 2013-07-05 ENCOUNTER — Telehealth: Payer: Self-pay | Admitting: Cardiovascular Disease

## 2013-07-05 DIAGNOSIS — I639 Cerebral infarction, unspecified: Secondary | ICD-10-CM

## 2013-07-05 NOTE — Telephone Encounter (Signed)
Order placed for test, pt aware he will receive a call to schedule.

## 2013-07-05 NOTE — Telephone Encounter (Signed)
Pt's pcp suggested a yearly carotid due to pt's stroke last year, an ger order to do here?  [phone]  Or [phone]

## 2013-07-06 ENCOUNTER — Other Ambulatory Visit: Payer: Self-pay | Admitting: Cardiovascular Disease

## 2013-07-12 ENCOUNTER — Ambulatory Visit (HOSPITAL_COMMUNITY): Payer: Medicare Other | Attending: Cardiovascular Disease

## 2013-07-12 DIAGNOSIS — I6529 Occlusion and stenosis of unspecified carotid artery: Secondary | ICD-10-CM | POA: Insufficient documentation

## 2013-07-12 DIAGNOSIS — I2510 Atherosclerotic heart disease of native coronary artery without angina pectoris: Secondary | ICD-10-CM | POA: Insufficient documentation

## 2013-07-12 DIAGNOSIS — Z8673 Personal history of transient ischemic attack (TIA), and cerebral infarction without residual deficits: Secondary | ICD-10-CM | POA: Insufficient documentation

## 2013-07-12 DIAGNOSIS — E785 Hyperlipidemia, unspecified: Secondary | ICD-10-CM | POA: Insufficient documentation

## 2013-07-12 DIAGNOSIS — I639 Cerebral infarction, unspecified: Secondary | ICD-10-CM

## 2013-07-12 DIAGNOSIS — I658 Occlusion and stenosis of other precerebral arteries: Secondary | ICD-10-CM | POA: Insufficient documentation

## 2013-07-12 DIAGNOSIS — I10 Essential (primary) hypertension: Secondary | ICD-10-CM | POA: Insufficient documentation

## 2013-08-08 ENCOUNTER — Other Ambulatory Visit: Payer: Self-pay

## 2013-08-08 MED ORDER — FENOFIBRATE 160 MG PO TABS: ORAL_TABLET | ORAL | Status: DC

## 2013-08-09 ENCOUNTER — Other Ambulatory Visit: Payer: Self-pay

## 2013-09-24 ENCOUNTER — Ambulatory Visit: Payer: Medicare Other | Admitting: Occupational Therapy

## 2013-10-17 ENCOUNTER — Ambulatory Visit: Payer: Medicare Other | Admitting: Cardiovascular Disease

## 2013-10-29 ENCOUNTER — Other Ambulatory Visit: Payer: Self-pay | Admitting: Cardiovascular Disease

## 2013-11-16 ENCOUNTER — Ambulatory Visit (INDEPENDENT_AMBULATORY_CARE_PROVIDER_SITE_OTHER): Payer: Medicare Other | Admitting: Cardiovascular Disease

## 2013-11-16 ENCOUNTER — Encounter: Payer: Self-pay | Admitting: Cardiovascular Disease

## 2013-11-16 VITALS — BP 120/78 | HR 57 | Ht 73.0 in | Wt 264.8 lb

## 2013-11-16 DIAGNOSIS — I6350 Cerebral infarction due to unspecified occlusion or stenosis of unspecified cerebral artery: Secondary | ICD-10-CM

## 2013-11-16 DIAGNOSIS — I4891 Unspecified atrial fibrillation: Secondary | ICD-10-CM

## 2013-11-16 DIAGNOSIS — I2510 Atherosclerotic heart disease of native coronary artery without angina pectoris: Secondary | ICD-10-CM

## 2013-11-16 DIAGNOSIS — I639 Cerebral infarction, unspecified: Secondary | ICD-10-CM

## 2013-11-16 LAB — LIPID PANEL
Cholesterol: 114 mg/dL (ref 0–200)
HDL: 31.8 mg/dL — ABNORMAL LOW (ref >39.00)
LDL Cholesterol: 68 mg/dL (ref 0–99)
Total CHOL/HDL Ratio: 4
Triglycerides: 71 mg/dL (ref 0.0–149.0)
VLDL: 14.2 mg/dL (ref 0.0–40.0)

## 2013-11-16 LAB — BASIC METABOLIC PANEL
BUN: 33 mg/dL — ABNORMAL HIGH (ref 6–23)
CO2: 22 mEq/L (ref 19–32)
Calcium: 9.9 mg/dL (ref 8.4–10.5)
Chloride: 105 mEq/L (ref 96–112)
Creatinine, Ser: 1.2 mg/dL (ref 0.4–1.5)
GFR: 64.94 mL/min (ref >60.00)
Glucose, Bld: 94 mg/dL (ref 70–99)
Potassium: 5.2 mEq/L — ABNORMAL HIGH (ref 3.5–5.1)
Sodium: 136 mEq/L (ref 135–145)

## 2013-11-16 LAB — HEPATIC FUNCTION PANEL
ALT: 32 U/L (ref 0–53)
AST: 29 U/L (ref 0–37)
Albumin: 4.2 g/dL (ref 3.5–5.2)
Alkaline Phosphatase: 43 U/L (ref 39–117)
Bilirubin, Direct: 0 mg/dL (ref 0.0–0.3)
Total Bilirubin: 0.9 mg/dL (ref 0.3–1.2)
Total Protein: 7.4 g/dL (ref 6.0–8.3)

## 2013-11-16 NOTE — Patient Instructions (Signed)
Your physician wants you to follow-up in: [place] DR. Acie Fredrickson You will receive a reminder letter in the mail two months in advance. If you don't receive a letter, please call our office to schedule the follow-up appointment.  Your physician recommends that you return for lab work in: TODAY (BMET, LIPIDS, HFP)  Your physician recommends that you continue on your current medications as directed. Please refer to the Current Medication list given to you today.

## 2013-11-16 NOTE — Assessment & Plan Note (Signed)
He remains in NSR.  Continue xarelto. No symptoms

## 2013-11-16 NOTE — Progress Notes (Signed)
Corey [name] Date of Birth  11-Jan-1946       [facility] [address], [place], [place]  [address]   [place], [place]  [address] [phone]     [phone]   Fax  [phone]    Fax [phone]  Problem List: 1. Coronary artery disease-status post PTCA and stenting of his right coronary artery 2. Hypertension 3. Hyperlipidemia 4. 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 Atrial fibrillation - discovered after his stroke  History of Present Illness: Corey [name] is a 68 yo gentleman with a history of coronary artery disease. Status post PTCA and stenting of his right coronary artery. He also has a history of hypertension, and hyperlipidemia.  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  He had a stroke in Oct.  He had some speech problems and loss of fine motor control of the right arm.  TEE was negative for PFO or LAA thrombus.   Event monitor showed no A-Fib.  He has been on Plavix since his stents in  [name] 4, 2014:  Corey [name] had an episode of atrial fib in January.  He was started on Xarelto and has done well.  He has had some fatigue on occasion.  He monitors his BP.    He has recovered from his stroke.    Feb. 13, 2015:  Corey [name] is doing ok.  Still having touble losing weight.  Has had a URI for the past month or so. No CP, no palps.  Has recovered well from his CVA.  He is looking forward to being able to exercise more   Current Outpatient Prescriptions on File Prior to Visit  Medication Sig Dispense Refill  . aspirin 81 MG tablet Take 1 tablet (81 mg total) by mouth daily.      Marland Kitchen atorvastatin (LIPITOR) 40 MG tablet Take 1 tablet (40 mg total) by mouth daily.  90 tablet  3  . fenofibrate 160 MG tablet TAKE 1 TABLET EVERY DAY  90 tablet  3  . fluticasone (FLONASE) 50 MCG/ACT nasal spray Place 2 sprays into the nose daily.        Marland Kitchen lisinopril (PRINIVIL,ZESTRIL) 20 MG tablet TAKE 1 TABLET (20  MG TOTAL) BY MOUTH DAILY.  30 tablet  0  . metoprolol succinate (TOPROL-XL) 100 MG 24 hr tablet TAKE 1 TABLET (100 MG TOTAL) BY MOUTH DAILY.  30 tablet  5  . Multiple Vitamins-Minerals (MULTIVITAMINS THER. W/MINERALS) TABS Take 1 tablet by mouth daily.        . nitroGLYCERIN (NITROSTAT) 0.4 MG SL tablet Place 1 tablet (0.4 mg total) under the tongue every 5 (five) minutes as needed. For chest pain  25 tablet  5  . omeprazole (PRILOSEC) 10 MG capsule TAKE ONE CAPSULE BY MOUTH EVERY DAY  30 capsule  5  . OVER THE COUNTER MEDICATION Place 1-2 sprays into the nose daily as needed. "Pure-mist", for nasal congestion      . Rivaroxaban (XARELTO) 20 MG TABS Take 1 tablet (20 mg total) by mouth daily.  30 tablet  11  . Tamsulosin HCl (FLOMAX) 0.4 MG CAPS Take 0.4 mg by mouth daily.         No current facility-administered medications on file prior to visit.    Allergies  Allergen Reactions  . Codeine     Heavy amounts cause nausea  . Monopril [  Fosinopril Sodium]     Muscle aches & pain  . Ampicillin Rash    Past Medical History  Diagnosis Date  . Hypertension   . Dyslipidemia   . Dyspnea   . Chest pain     Sees Dr. Acie Fredrickson, saw last [name] 2012  . SOB (shortness of breath)   . GERD (gastroesophageal reflux disease)   . Complication of anesthesia     does have small airway  . PONV (postoperative nausea and vomiting)   . Recurrent upper respiratory infection (URI)     4months ago, treated with claritin D and 5 day dose pack  . Pneumonia     1992  . Sleep apnea     cpap  . Arthritis     back, neck, shoulders  . Coronary artery disease     STATUS POST PTCA AND STENTING OF HIS RIGHT CORONARY ARTERY  . CVA (cerebral infarction)     Past Surgical History  Procedure Laterality Date  . Cardiac catheterization  12/23/2005    NORMAL CHAMBER SIZE AND NORMAL GLOBAL SYSTOLIC FUNCTION. EF 65%  . Cardiac catheterization  12/24/2004    NORMAL LEFT VENTRICULAR SYSTOLIC FUNCTION.THE LEFT VENTRICLE  APPEARS TO BE MILDLY ENLARGED.  . Cardiac catheterization  05/27/2003    MILD CARDIOMEGALY. EF 55-60%  . Cervical fusion  07/2001  . Lumbar fusion  10/2003    L5-S1  . Knee arthroscopy  10/2004    LEFT KNEE  . Replacement total knee      left  . Tonsillectomy    . Coronary stent placement      first Aug 2004, 2nd stent 12/2005  . Cardiovascular stress test      2011  . US echocardiography      2011  . Tee without cardioversion  07/07/2012    Procedure: TRANSESOPHAGEAL ECHOCARDIOGRAM (TEE);  Surgeon: Fay Records, MD;  Location: [facility] ENDOSCOPY;  Service: Cardiovascular;  Laterality: N/A;    History  Smoking status  . Former Smoker -- 1.00 packs/day for 20 years  . Types: Cigarettes  . Quit date: 10/04/2002  Smokeless tobacco  . Never Used    History  Alcohol Use No    Family History  Problem Relation Age of Onset  . Hypertension Mother   . Heart disease Father   . Heart attack Father     Reviw of Systems:  Reviewed in the HPI.  All other systems are negative.  Physical Exam: Blood pressure 120/78, pulse 57, height 6\' 1"  (1.854 m), weight 264 lb 12.8 oz (120.112 kg). General: Well developed, well nourished, in no acute distress.  Head: Normocephalic, atraumatic, sclera non-icteric, mucus membranes are moist,  Neck: Supple. Carotids are 2 + without bruits. No JVD Lungs: Clear bilaterally to auscultation. Heart: regular rate.  normal  S1 S2. No murmurs, gallops or rubs. Abdomen: Soft, non-tender, non-distended with normal bowel sounds. No hepatomegaly. No rebound/guarding. No masses. Msk:  Strength and tone are normal Extremities: No clubbing or cyanosis. No edema.  Distal pedal pulses are 2+ and equal bilaterally. Neuro: Alert and oriented X 3. Moves all extremities spontaneously. Psych:  Responds to questions appropriately with a normal affect.  ECG: Nov 16, 2013:  Sinus brady at 23. No ST or T wave changes   Assessment / Plan:

## 2013-11-16 NOTE — Assessment & Plan Note (Signed)
No CP.  will check fasting lipids today and again in 6 months.

## 2013-11-26 ENCOUNTER — Other Ambulatory Visit: Payer: Self-pay | Admitting: Cardiovascular Disease

## 2013-11-26 NOTE — Telephone Encounter (Signed)
lisinopril (PRINIVIL,ZESTRIL) 20 MG tablet  TAKE 1 TABLET (20 MG TOTAL) BY MOUTH DAILY  Your physician recommends that you continue on your current medications as directed. Please refer to the Current Medication list given to you today. Thayer Headings, MD at 11/16/2013 10:31 AM

## 2013-11-27 ENCOUNTER — Other Ambulatory Visit: Payer: Self-pay | Admitting: Cardiovascular Disease

## 2013-11-29 ENCOUNTER — Ambulatory Visit: Payer: Medicare Other | Admitting: Nurse Practitioner

## 2013-12-03 ENCOUNTER — Ambulatory Visit
Admission: RE | Admit: 2013-12-03 | Discharge: 2013-12-03 | Disposition: A | Discharge status: Home / Self Care | Payer: 59 | Source: Ambulatory Visit | Attending: Family Medicine | Admitting: Family Medicine

## 2013-12-03 ENCOUNTER — Other Ambulatory Visit: Payer: Self-pay | Admitting: Family Medicine

## 2013-12-03 DIAGNOSIS — R109 Unspecified abdominal pain: Secondary | ICD-10-CM

## 2013-12-03 MED ORDER — IOHEXOL 300 MG/ML  SOLN
125.0000 mL | Freq: Once | INTRAMUSCULAR | Status: AC | PRN
  Administered 2013-12-03: 125 mL via INTRAVENOUS

## 2013-12-05 DIAGNOSIS — N179 Acute kidney failure, unspecified: Secondary | ICD-10-CM | POA: Insufficient documentation

## 2013-12-05 DIAGNOSIS — R109 Unspecified abdominal pain: Secondary | ICD-10-CM | POA: Insufficient documentation

## 2013-12-05 DIAGNOSIS — R110 Nausea: Secondary | ICD-10-CM | POA: Insufficient documentation

## 2013-12-05 DIAGNOSIS — A419 Sepsis, unspecified organism: Secondary | ICD-10-CM | POA: Insufficient documentation

## 2013-12-06 ENCOUNTER — Other Ambulatory Visit: Payer: Self-pay

## 2013-12-06 MED ORDER — OMEPRAZOLE 10 MG PO CPDR: DELAYED_RELEASE_CAPSULE | ORAL | Status: DC

## 2013-12-06 MED ORDER — METOPROLOL SUCCINATE ER 100 MG PO TB24: ORAL_TABLET | ORAL | Status: DC

## 2013-12-15 ENCOUNTER — Other Ambulatory Visit: Payer: Self-pay | Admitting: Cardiovascular Disease

## 2014-01-17 ENCOUNTER — Telehealth: Payer: Self-pay | Admitting: *Deleted

## 2014-01-17 NOTE — Telephone Encounter (Signed)
PA to [facility] for patients xarelto

## 2014-01-23 NOTE — Progress Notes (Signed)
Patient ID: Corey [name], male   DOB: 1946/06/03, 68 y.o.   MRN: [id number] Patient was approved for xarelto from 01/17/14- 01/18/15. Id # [id number] Patient aware

## 2014-02-01 ENCOUNTER — Institutional Professional Consult (permissible substitution): Payer: Medicare Other | Admitting: Pulmonary Disease

## 2014-02-01 HISTORY — PX: COLONOSCOPY W/ POLYPECTOMY: SHX1380

## 2014-02-04 ENCOUNTER — Institutional Professional Consult (permissible substitution): Payer: Medicare Other | Admitting: Pulmonary Disease

## 2014-03-01 ENCOUNTER — Institutional Professional Consult (permissible substitution): Payer: Medicare Other | Admitting: Pulmonary Disease

## 2014-03-20 ENCOUNTER — Telehealth: Payer: Self-pay | Admitting: Cardiovascular Disease

## 2014-03-20 NOTE — Telephone Encounter (Signed)
Pt states that he has been in sinus rhythm for a few months . Now for the last few weeks he has been going in and out of A-fib, sometimes his A-fib last from 20 minutes to an hour. Pt takes Metoprolol 100 mg once daily. Last night pt was in A-fib for 1 1/2 hours. Pt states he exercise heavily  yesterday in the gym. He does not know if that causing the A-fib. Pt would like to know if he needs to be seen. Pt is aware that Dr. Acie Fredrickson is not in the office this week.

## 2014-03-20 NOTE — Telephone Encounter (Signed)
Dr. Ron Parker DOD aware, and recommends for pt to decreased the intensity of exercise, and to make a F/U  appointment with Dr. Acie Fredrickson. An appointment wit Dr. Acie Fredrickson on 04/08/14 at 0800 Pt aware.

## 2014-03-20 NOTE — Telephone Encounter (Signed)
New problem   Pt is in afib and need to speak to nurse. Please call pt.

## 2014-03-27 ENCOUNTER — Ambulatory Visit (INDEPENDENT_AMBULATORY_CARE_PROVIDER_SITE_OTHER): Payer: Medicare Other | Admitting: Pulmonary Disease

## 2014-03-27 ENCOUNTER — Encounter: Payer: Self-pay | Admitting: Pulmonary Disease

## 2014-03-27 VITALS — BP 140/92 | HR 65 | Temp 98.0°F | Ht 73.5 in | Wt 262.4 lb

## 2014-03-27 DIAGNOSIS — Z9989 Dependence on other enabling machines and devices: Secondary | ICD-10-CM | POA: Insufficient documentation

## 2014-03-27 DIAGNOSIS — G4733 Obstructive sleep apnea (adult) (pediatric): Secondary | ICD-10-CM | POA: Insufficient documentation

## 2014-03-27 NOTE — Progress Notes (Signed)
Subjective:    Patient ID: Corey [name], male    DOB: Nov 07, 1945, 68 y.o.   MRN: [id number]  HPI The patient is a 68 year old male who comes in today for management of obstructive sleep apnea. He was diagnosed in 2006 with severe OSA, with an AHI of 42 events per hour. He was started on CPAP for this, and used for a few years with a very good response to treatment. Unfortunately, he had issues with burning of his nasal passages, congestion, and rhinorrhea, however did not use a heated humidifier. He therefore discontinued therapy, but has continued to have significant symptoms. He is still having loud snoring, as well as witnessed apneas. He is having frequent awakenings at night, along with nonrestorative sleep. He states that he will fall asleep easily with any period of inactivity during the day or night, but does not have any sleepiness with driving. His weight is stable from his 2006 sleep study, and his Epworth score today is very abnormal at 14.  He should also be noted that he has had a stroke in the recent past.   Sleep Questionnaire What time do you typically go to bed?( Between what hours) 11pm 11pm at 1047 on 03/27/14 by Lilli Few, CMA How long does it take you to fall asleep? 10 minutes 10 minutes at 1047 on 03/27/14 by Lilli Few, CMA How many times during the night do you wake up? 6 6 at 1047 on 03/27/14 by Lilli Few, CMA What time do you get out of bed to start your day? 0700 0700 at 1047 on 03/27/14 by Lilli Few, CMA Do you drive or operate heavy machinery in your occupation? No No at 1047 on 03/27/14 by Lilli Few, CMA How much has your weight changed (up or down) over the past two years? (In pounds) 5 lb (2.268 kg) 5 lb (2.268 kg) at 1047 on 03/27/14 by Lilli Few, CMA Have you ever had a sleep study before? Yes Yes at 1047 on 03/27/14 by Lilli Few, CMA If yes, location of study? [place]   long at 1047 on 03/27/14 by Lilli Few, CMA If yes, date of study? 2012 2012 at 1047 on 03/27/14 by Lilli Few, CMA Do you currently use CPAP? Yes Yes at 1047 on 03/27/14 by Lilli Few, CMA If so, what pressure? has one, not using it currently has one, not using it currently at 1047 on 03/27/14 by Lilli Few, CMA Do you wear oxygen at any time? No No at 1047 on 03/27/14 by Lilli Few, CMA   Review of Systems  Constitutional: Negative for fever and unexpected weight change.  HENT: Negative for congestion, dental problem, ear pain, nosebleeds, postnasal drip, rhinorrhea, sinus pressure, sneezing, sore throat and trouble swallowing.   Eyes: Negative for redness and itching.  Respiratory: Negative for cough, chest tightness, shortness of breath and wheezing.   Cardiovascular: Negative for palpitations and leg swelling.  Gastrointestinal: Negative for nausea and vomiting.  Genitourinary: Negative for dysuria.  Musculoskeletal: Negative for joint swelling.  Skin: Negative for rash.  Neurological: Negative for headaches.  Hematological: Does not bruise/bleed easily.  Psychiatric/Behavioral: Negative for dysphoric mood. The patient is not nervous/anxious.        Objective:   Physical Exam Constitutional:  Overweight male, no acute distress  HENT:  Nares patent without discharge, but narrowed bilat.  Oropharynx without exudate, palate and uvula are elongated with narrowing posteriorly  Eyes:  Perrla, eomi, no scleral icterus  Neck:  No JVD, no TMG  Cardiovascular:  Normal rate, ?irregular rhythm, no rubs or gallops.  No murmurs        Intact distal pulses  Pulmonary :  Normal breath sounds, no stridor or respiratory distress   No rales, rhonchi, or wheezing  Abdominal:  Soft, nondistended, bowel sounds present.  No tenderness noted.   Musculoskeletal:  No lower extremity edema noted.  Lymph Nodes:  No cervical lymphadenopathy  noted  Skin:  No cyanosis noted  Neurologic:  Alert, appropriate, moves all 4 extremities without obvious deficit.         Assessment & Plan:

## 2014-03-27 NOTE — Assessment & Plan Note (Signed)
The patient has a history of severe obstructive sleep apnea, and is currently not wearing CPAP because of issues with nasal dryness, rhinorrhea, and congestion. However, he was not using a heated humidifier at that time. He is still having significant symptoms, and has multiple comorbid medical issues that can be greatly affected by sleep disordered breathing. At this point, it is very important that we get him back on therapy, and I will work with him on machine tolerance. I suspect adding humidity will solve his nasal issues. I have also stressed to him the importance of aggressive weight loss.

## 2014-03-27 NOTE — Patient Instructions (Signed)
Will order a cpap machine for you with heated humidifier.  Please call if having issues with tolerance Work on weight loss Will see you back 8 weeks after you start the cpap.  Please call back once you get your machine and we can get set up.

## 2014-04-05 ENCOUNTER — Encounter (HOSPITAL_COMMUNITY): Payer: Self-pay | Admitting: Emergency Medicine

## 2014-04-05 ENCOUNTER — Emergency Department (HOSPITAL_COMMUNITY)
Admission: EM | Admit: 2014-04-05 | Discharge: 2014-04-05 | Disposition: A | Discharge status: Home / Self Care | Payer: Medicare Other | Attending: Emergency Medicine | Admitting: Emergency Medicine

## 2014-04-05 ENCOUNTER — Telehealth: Payer: Self-pay | Admitting: Physician Assistant

## 2014-04-05 ENCOUNTER — Emergency Department (HOSPITAL_COMMUNITY): Payer: Medicare Other

## 2014-04-05 DIAGNOSIS — Z7982 Long term (current) use of aspirin: Secondary | ICD-10-CM | POA: Insufficient documentation

## 2014-04-05 DIAGNOSIS — I498 Other specified cardiac arrhythmias: Secondary | ICD-10-CM | POA: Insufficient documentation

## 2014-04-05 DIAGNOSIS — R0789 Other chest pain: Secondary | ICD-10-CM

## 2014-04-05 DIAGNOSIS — I4891 Unspecified atrial fibrillation: Secondary | ICD-10-CM | POA: Insufficient documentation

## 2014-04-05 DIAGNOSIS — Z8709 Personal history of other diseases of the respiratory system: Secondary | ICD-10-CM | POA: Insufficient documentation

## 2014-04-05 DIAGNOSIS — I10 Essential (primary) hypertension: Secondary | ICD-10-CM

## 2014-04-05 DIAGNOSIS — I480 Paroxysmal atrial fibrillation: Secondary | ICD-10-CM

## 2014-04-05 DIAGNOSIS — M259 Joint disorder, unspecified: Secondary | ICD-10-CM | POA: Insufficient documentation

## 2014-04-05 DIAGNOSIS — I209 Angina pectoris, unspecified: Secondary | ICD-10-CM

## 2014-04-05 DIAGNOSIS — Z79899 Other long term (current) drug therapy: Secondary | ICD-10-CM | POA: Insufficient documentation

## 2014-04-05 DIAGNOSIS — Z8701 Personal history of pneumonia (recurrent): Secondary | ICD-10-CM | POA: Insufficient documentation

## 2014-04-05 DIAGNOSIS — Z8669 Personal history of other diseases of the nervous system and sense organs: Secondary | ICD-10-CM | POA: Insufficient documentation

## 2014-04-05 DIAGNOSIS — R072 Precordial pain: Secondary | ICD-10-CM

## 2014-04-05 DIAGNOSIS — I25119 Atherosclerotic heart disease of native coronary artery with unspecified angina pectoris: Secondary | ICD-10-CM

## 2014-04-05 DIAGNOSIS — Z8673 Personal history of transient ischemic attack (TIA), and cerebral infarction without residual deficits: Secondary | ICD-10-CM | POA: Insufficient documentation

## 2014-04-05 DIAGNOSIS — Z87891 Personal history of nicotine dependence: Secondary | ICD-10-CM | POA: Insufficient documentation

## 2014-04-05 DIAGNOSIS — E785 Hyperlipidemia, unspecified: Secondary | ICD-10-CM

## 2014-04-05 DIAGNOSIS — K219 Gastro-esophageal reflux disease without esophagitis: Secondary | ICD-10-CM | POA: Insufficient documentation

## 2014-04-05 DIAGNOSIS — I491 Atrial premature depolarization: Secondary | ICD-10-CM

## 2014-04-05 DIAGNOSIS — I2510 Atherosclerotic heart disease of native coronary artery without angina pectoris: Secondary | ICD-10-CM | POA: Insufficient documentation

## 2014-04-05 HISTORY — DX: Essential (primary) hypertension: I10

## 2014-04-05 HISTORY — DX: Paroxysmal atrial fibrillation: I48.0

## 2014-04-05 HISTORY — DX: Atherosclerotic heart disease of native coronary artery without angina pectoris: I25.10

## 2014-04-05 HISTORY — DX: Obesity, unspecified: E66.9

## 2014-04-05 LAB — BASIC METABOLIC PANEL
Anion gap: 13 (ref 5–15)
BUN: 21 mg/dL (ref 6–23)
CO2: 24 mEq/L (ref 19–32)
Calcium: 9.3 mg/dL (ref 8.4–10.5)
Chloride: 105 mEq/L (ref 96–112)
Creatinine, Ser: 1.29 mg/dL (ref 0.50–1.35)
GFR calc Af Amer: 65 mL/min — ABNORMAL LOW (ref >90)
GFR calc non Af Amer: 56 mL/min — ABNORMAL LOW (ref >90)
Glucose, Bld: 122 mg/dL — ABNORMAL HIGH (ref 70–99)
Potassium: 4.6 mEq/L (ref 3.7–5.3)
Sodium: 142 mEq/L (ref 137–147)

## 2014-04-05 LAB — CBC
HCT: 41.6 % (ref 39.0–52.0)
Hemoglobin: 13.4 g/dL (ref 13.0–17.0)
MCH: 28.7 pg (ref 26.0–34.0)
MCHC: 32.2 g/dL (ref 30.0–36.0)
MCV: 89.1 fL (ref 78.0–100.0)
Platelets: 238 10*3/uL (ref 150–400)
RBC: 4.67 MIL/uL (ref 4.22–5.81)
RDW: 14.6 % (ref 11.5–15.5)
WBC: 5.9 10*3/uL (ref 4.0–10.5)

## 2014-04-05 LAB — TROPONIN I: Troponin I: <0.3 ng/mL (ref <0.30)

## 2014-04-05 LAB — I-STAT TROPONIN, ED: Troponin i, poc: 0.01 ng/mL (ref 0.00–0.08)

## 2014-04-05 LAB — TSH: TSH: 2.82 u[IU]/mL (ref 0.350–4.500)

## 2014-04-05 MED ORDER — ASPIRIN 325 MG PO TABS
325.0000 mg | ORAL_TABLET | ORAL | Status: AC
  Administered 2014-04-05: 325 mg via ORAL
  Filled 2014-04-05: qty 1

## 2014-04-05 NOTE — ED Notes (Signed)
Per Patient: Patient reports he has had several episodes of intermittent AFIB over the last few months. Reports having palpitations today, report also checking his pulse several time and his HR was in the 40's. Pt has Hx of cardiac cath and CVA. Pt currently ax4, NAD at the moment.

## 2014-04-05 NOTE — H&P (Addendum)
Consultation  Patient ID: Corey [name] MRN: [id number], DOB: 1946/08/21 Date of Encounter: 04/05/2014, 7:07 PM Primary Physician: [name], MD Primary Cardiologist: Nahser  Chief Complaint: CP, irregular heartbeat Reason for Admission: as above  HPI: Corey [name] is a 68 y/o M with history of CAD (s/p stenting of mRCA in 2004 & 2007), HTN, HL, CVA 07/2012 with PAF discovered after his stroke (10/2012 by EMS EKG per ER notes) who presents to [facility] for evaluation of AF, HR registering in the 40's on home monitor, and chest discomfort.  He called the answering service today reporting increased frequency and duration of palpations along with chest pressure. He has had palpitations that come and go, but over the last few weeks these episodes have tended to last longer than usual. He assumed they were atrial fibrillation. Today he felt it all day so called the answering service for advice. He also reported a mild vague chest discomfort intermittently with this along with discomfort between his shoulder blades. This is not exertional or worse with any provocative factors including inspiration or palpation. It occurs at random without any rhythm or reason. He checked his vitals on a home monitor which registered a HR intermittently in the 40's. Because of his symptoms (and office closed), he was advised to proceed to the ER for evaluation. Here, he continued to report intermittent palpitations which he perceived as AF, but was found to be in NSR with occasional PACs and brief atrial bigeminy with stable BP. CXR clear, labs nonacute, troponin neg x 1, glu 122.  He exercises at the [facility] doing 30 minutes on the bike and 30 minutes on the treadmill occasionally - he used to do this 3x a week but has gotten into the habit of only 1x a week. He did so yesterday without any limitations whatsoever. He reports compliance with medicines. He has not been using CPAP but has been working with PCP to get new  mask - he expects this delivered next week. No other LEE, orthopnea, syncope, bleeding, fever.  Past Medical History  Diagnosis Date  . Essential hypertension, benign   . Dyslipidemia   . GERD (gastroesophageal reflux disease)   . Recurrent upper respiratory infection (URI)   . Sleep apnea     CPAP  . Arthritis   . Coronary atherosclerosis of native coronary artery     a. BMS to [facility] 2004 and 2007, otherwise mild nonobstructive disease. EF normal.  . CVA (cerebral infarction)     a. 07/2012 - AF discovered after stroke.  . Paroxysmal atrial fibrillation     a. Discovered after stroke.  . Obesity      Most Recent Cardiac Studies: Cath 12/2005 CARDIAC CATHETERIZATION  PROCEDURES PERFORMED:  1. Left heart catheterization.  2. Coronary angiography.  3. Left ventriculogram.  4. PCI/bare-metal stent implantation mid-right coronary.  5. Intravascular ultrasound.  6. Intracoronary thrombectomy.  INDICATIONS: Corey [name] is a 68 year old man with known ASCVD, and S/P  drug-eluting/Taxus stent implantation August of 2004. He underwent coronary  angiography in March 2006 because of anginal symptoms. The stent was widely  patent. He presented approximately 1645 this afternoon at [facility] Emergency Room with chest pain x2-1/2 hours. An acute inferior  myocardial infarction was apparent on ECG. He was brought to catheterization  laboratory at this time to identify the extent of disease and provide for  further therapeutic options.  PROCEDURE NOTE: The patient is brought to cardiac catheterization  laboratory in  the fasting state. The right groin was prepped and draped in  the usual sterile fashion. Local anesthesia was obtained with infiltration  of 1% lidocaine. A 6-French catheter sheath was inserted percutaneously in  the right femoral artery utilizing an anterior approach over a guiding J-  wire. The patient had originally received 5000 units of heparin  intravenously  in the emergency room and the initial ACT was 217 seconds. He  did receive an additional 1000 units of heparin. A 6-French #4 left Judkins  catheter was advanced to the ascending aorta where the pressure was  recorded. The left coronary os was then engaged and cineangiography  performed in LAO and RAO projections. The #4 left Judkins catheter was  exchanged for #4 right guiding catheter short-tip. This was advanced to the  ascending aorta and the right coronary os engaged. Cineangiography performed  in LAO and RAO projections. It was noted that the artery was patent with  TIMI grade III flow but there was extensive filling defects throughout the  stented segment in the mid-right coronary. The patient received a double  bolus Integrilin in constant infusion. A 0.014 inches Luge intracoronary  guidewire was passed across the lesion in to the stented segment in the mid-  right coronary without difficulty. Intracoronary thrombectomy was then  performed using the export catheter. Two separate syringes of heme were  obtained during multiple pullback and advance this through the thrombotic  portion of the artery. This maneuver did successfully improve the  angiographic appearance with less evidence of filling defects. A 3.5/24 mm  Scimed Liberte stent was then placed across the midportion of the lesion.  The previously placed stent was a 3.0/32 mm Taxus. The Liberte stent was  deployed at peak pressure of 16 atmospheres for approximately 20 seconds.  This balloon was deflated and removed and the intravascular ultrasound  catheter Atlantis was advanced distal to the stented segment. A single  mechanical pullback was obtained and the images were analyzed. There  appeared to be good stent apposition, however, there was a decrease in  luminal diameter within the mid-portion of the stented segment. Therefore a  3.75/20 mm Scimed Quantum Maverick intracoronary balloon was placed within  the mid-portion  of the stented segment inflated to peak pressure of 20  atmospheres. It was deflated, withdrawn slightly and inflated again to 20  atmospheres. Neither inflation were greater than 30 seconds.  The intravascular ultrasound catheter was return of circulation and a single  mechanical pullback obtained. Cineangiography was repeated in orthogonal  views without the guidewire in place. The guiding catheter was removed. A  pigtail catheter was then advanced to the left ventricle and pressure  recorded. A cine angiogram in the RAO projection obtained with a power  injector, 20 mL were injected at 12 mL per second. Pressure was recorded  during pullback of the catheter across the aortic valve. The pigtail  catheter was then removed. The arterial sheath was sewn into place. The  patient was transported to recovery area in stable condition.  HEMODYNAMIC RESULTS: Systemic arterial pressure was 114/68 with a mean of  88 mmHg. There was no systolic gradient across the aortic valve. Left  ventricular end-diastolic pressure was 10 mmHg pre ventriculogram.  ANGIOGRAPHY: The left ventriculogram demonstrated normal chamber size and  normal global systolic function. It was a suboptimal study due to  ventricular tachycardia. I could not identify any regional wall motion  abnormality inferiorly. A visual estimated ejection fraction is 65%.  There was  a right dominant coronary system present. The main left coronary  contained a 10% to 20% mid-vessel tubular stenosis.  The left anterior descending artery and its branches were minimally  diseased; there is some narrowing in the mid-portion about 30-40% in  magnitude. It extends over about 15-20 mm. No significant diagonal branches  are present. There is a large ramus branch. The ongoing anterior descending  artery is diffusely diseased, transapical and widely patent.  The left circumflex coronary artery amounts to a large ramus intermedius and  a small circumflex  itself. There are no significant obstructions within the  ramus or the circumflex itself.  The right coronary artery is a large vessel and contains a 20% narrowing in  the proximal portion before the proximal edge of the stented segment. The  stented segment itself is diffusely thrombotic with multiple filling  defects. A gross estimate of the degree of obstruction would be in the 50-  60% range. The ongoing right coronary is widely patent. It gives rise to a  moderate-sized posterior descending artery and a large posterolateral  segment with several moderate-sized left ventricular branches. There are no  obstruction seen distally.  Following balloon dilatation and stent implantation, there was no residual  stenosis in the right coronary and the flow was TIMI grade III.  INTRAVASCULAR ULTRASOUND: The distal reference vessel size following  initial stent placement was 3.1 x 3.3 mm with the square area of 7.93 mm2.  The mid-portion of the stented segment following high-pressure balloon  dilatation measured 2.9 x 3.4 mm and had a 7.81 mm2 area.  FINAL IMPRESSION:  1. Atherosclerotic cardiovascular disease, single vessel.  2. Status post successful percutaneous coronary intervention/bare-metal  stent implantation mid-right coronary  artery (stent sandwich).  3. Acute inferior wall myocardial infarction aborted with intravenous  heparin.  4. Intact left ventricular size and global systolic function. Ejection  fraction 65%.   TEE 07/2012 Study Conclusions Left atrium: No evidence of thrombus in the atrial cavity or appendage. Transesophageal echocardiography. ------------------------------------------------------------ ------------------------------------------------------------ Left ventricle: NOrmal LV systolic function. 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 ------------------------------------------------------------ Aorta: Mild fixed plaque in thoracic aorta. ------------------------------------------------------------ Mitral valve: MV is normal. Trace MR. ------------------------------------------------------------ Left atrium: No evidence of thrombus in the atrial cavity or appendage. ------------------------------------------------------------ Atrial septum: NO PFO as tested with color doppler or with injection of agitated saline. ------------------------------------------------------------ Post procedure conclusions Ascending Aorta: - Mild fixed plaque in thoracic aorta.  2D Echo 07/2012 - Left ventricle: The cavity size was normal. Wall thickness was increased in a pattern of mild LVH. Systolic function was normal. The estimated ejection fraction was in the range of 55% to 60%. Although no diagnostic regional wall motion abnormality was identified, this possibility cannot be completely excluded on the basis of this study. Doppler parameters are consistent with abnormal left ventricular relaxation (grade 1 diastolic dysfunction). - Aortic valve: There was no stenosis. - Mitral valve: Mildly calcified annulus. No significant regurgitation. - Right ventricle: The cavity size was normal. Systolic function was normal. - Pulmonary arteries: No complete TR doppler jet so unable to estimate PA systolic pressure. - Inferior vena cava: The vessel was normal in size; the respirophasic diameter changes were in the normal range (= 50%); findings are consistent with normal central venous pressure. Impressions: - Normal LV size and systolic function with mild LV hypertrophy. EF 55-60%. Normal RV size and systolic function. No significant valvular abnormalities.     Surgical History:  Past  Surgical History  Procedure Laterality Date  . Cervical fusion  07/2001  . Lumbar fusion  10/2003    L5-S1  . Knee arthroscopy  10/2004    LEFT  KNEE  . Replacement total knee      left  . Tonsillectomy    . Tee without cardioversion  07/07/2012    Procedure: TRANSESOPHAGEAL ECHOCARDIOGRAM (TEE);  Surgeon: Corey Records, MD;  Location: [facility] ENDOSCOPY;  Service: Cardiovascular;  Laterality: N/A;     Home Meds: Prior to Admission medications   Medication Sig Start Date End Date Taking? Authorizing Provider  acetaminophen (TYLENOL) 325 MG tablet Take 325-650 mg by mouth every 6 (six) hours as needed (pain).   Yes Historical Provider, MD  aspirin EC 81 MG tablet Take 81 mg by mouth daily.   Yes Historical Provider, MD  atorvastatin (LIPITOR) 40 MG tablet Take 1 tablet (40 mg total) by mouth daily. 05/09/13  Yes Thayer Headings, MD  diclofenac sodium (VOLTAREN) 1 % GEL Apply 4 g topically 4 (four) times daily as needed (for pain / inflammation).   Yes Historical Provider, MD  fenofibrate 160 MG tablet Take 160 mg by mouth daily.   Yes Historical Provider, MD  fluticasone (FLONASE) 50 MCG/ACT nasal spray Place 2 sprays into the nose daily.     Yes Historical Provider, MD  lisinopril (PRINIVIL,ZESTRIL) 20 MG tablet Take 20 mg by mouth daily.   Yes Historical Provider, MD  metoprolol (TOPROL-XL) 200 MG 24 hr tablet Take 200 mg by mouth daily.   Yes Historical Provider, MD  Multiple Vitamins-Minerals (MULTIVITAMINS THER. W/MINERALS) TABS Take 1 tablet by mouth daily.     Yes Historical Provider, MD  omeprazole (PRILOSEC) 10 MG capsule Take 10 mg by mouth daily.   Yes Historical Provider, MD  rivaroxaban (XARELTO) 20 MG TABS tablet Take 20 mg by mouth daily with supper.   Yes Historical Provider, MD  Tamsulosin HCl (FLOMAX) 0.4 MG CAPS Take 0.4 mg by mouth daily.     Yes Historical Provider, MD  nitroGLYCERIN (NITROSTAT) 0.4 MG SL tablet Place 0.4 mg under the tongue every 5 (five) minutes as needed for chest pain.    Historical Provider, MD    Allergies:  Allergies  Allergen Reactions  . Codeine Nausea Only    Heavy amounts cause nausea  .  Monopril [Fosinopril Sodium] Other (See Comments)    Muscle aches & pain  . Ampicillin Rash    History   Social History  . Marital Status: Married    Spouse Name: Corey [name]    Number of Children: 3  . Years of Education: 14   Occupational History  . Retired     Disabled   Social History Main Topics  . Smoking status: Former Smoker -- 1.00 packs/day for 20 years    Types: Cigarettes    Quit date: 10/04/2002  . Smokeless tobacco: Never Used  . Alcohol Use: No  . Drug Use: No  . Sexual Activity: Not on file   Other Topics Concern  . Not on file   Social History Narrative   Patient lives at home with spouse.   Caffeine Use:      Family History  Problem Relation Age of Onset  . Hypertension Mother   . Heart disease Father   . Heart attack Father     Review of Systems: All other systems reviewed and are otherwise negative except as noted above.  Labs:   Lab Results  Component Value Date  WBC 5.9 04/05/2014   HGB 13.4 04/05/2014   HCT 41.6 04/05/2014   MCV 89.1 04/05/2014   PLT 238 04/05/2014    Recent Labs Lab 04/05/14 1730  NA 142  K 4.6  CL 105  CO2 24  BUN 21  CREATININE 1.29  CALCIUM 9.3  GLUCOSE 122*   Troponin neg x 1  Lab Results  Component Value Date   CHOL 114 11/16/2013   HDL 31.80* 11/16/2013   LDLCALC 68 11/16/2013   TRIG 71.0 11/16/2013   Radiology/Studies:  Dg Chest 2 View 04/05/2014   CLINICAL DATA:  Chest pressure with history of atrial fibrillation and hypertension and previous tobacco use  EXAM: CHEST  2 VIEW  COMPARISON:  Portable chest x-ray of October 31, 2012  FINDINGS: The lungs are adequately inflated. There is no focal infiltrate. The heart is top-normal in size. The pulmonary vascularity is not engorged. There is no pleural effusion. The bony thorax is unremarkable.  IMPRESSION: There is no acute cardiopulmonary abnormality.   Electronically Signed   By: Corey  [name]   On: 04/05/2014 18:05    EKG: NSR 93bpm no acute ST-T  changes  Physical Exam: Blood pressure 129/76, pulse 82, temperature 98.2 F (36.8 C), temperature source Oral, resp. rate 20, height 6\' 1"  (1.854 m), weight 256 lb (116.121 kg), SpO2 95.00%. General: Well developed, well nourished, overweight WM in no acute distress. Head: Normocephalic, atraumatic, sclera non-icteric, no xanthomas, nares are without discharge.  Neck: Negative for carotid bruits. JVD not elevated. Lungs: Clear bilaterally to auscultation without wheezes, rales, or rhonchi. Breathing is unlabored. Heart: RRR with S1 S2. No murmurs, rubs, or gallops appreciated. Abdomen: Soft, non-tender, non-distended with normoactive bowel sounds. No hepatomegaly. No rebound/guarding. No obvious abdominal masses. Msk:  Strength and tone appear normal for age. Extremities: No clubbing or cyanosis. No edema.  Distal pedal pulses are 2+ and equal bilaterally. Neuro: Alert and oriented X 3. No focal deficit. No facial asymmetry. Moves all extremities spontaneously. Psych:  Responds to questions appropriately with a normal affect.   ASSESSMENT AND PLAN:  1. Palpitations - currently in NSR with PACs 2. Reported history of atrial fib in 10/2012 ED visit 3. HTN 4. Sleep apnea, receiving new mask next week 5. CAD s/p stenting to RCA 2004, 2007 with intermittent nonexertional chest discomfort  Signed, Corey Dunn PA-C 04/05/2014, 7:07 PM   Attending note:  Patient seen and examined. Reviewed [name] and discussed the case with Ms. Corey [name]. Corey [name] has a history of paroxysmal atrial fibrillation diagnosed in January 2014, now managed with combination of Xarelto and Toprol-XL. In addition he has known CAD status post BMS to the RCA in 2014 and 2007. He remains active with work, also exercises at [employer]. Over the last 6 weeks he has been experiencing a regular feeling of palpitations, nearly every day, describing feeling of irregular heartbeat and vague discomfort in his chest. He has not  required nitroglycerin. He exercised per his usual routine at the [facility] yesterday without any symptoms. Today has felt very frequent palpitations, more prolonged than normal, and came to the ER for further evaluation. He reports compliance with his medications. ECG shows normal sinus rhythm, telemetry has shown sinus rhythm as well, with periods of atrial bigeminy, no atrial fibrillation noted however. Troponin I level negative. Interestingly, he does state that his home blood pressure monitor has occasionally registered heart rate in the "40s." I wonder whether the device was miscounting periods of atrial bigeminy when  in fact his true heart rate was probably in the 80s. He has had no dizziness or syncope.  Situation discussed with the patient and his wife. He is comfortable going home this evening, in fact has a visit already scheduled to see Corey [name] on Monday. No changes made to current medical regimen. We will send a TSH before he leaves this evening. My thought would be that a followup Myoview would be a reasonable next step to exclude any progression of ischemic heart disease since he has had a change in symptomatology over the last 6 weeks and a vague sense of chest pain occasionally. He would probably also benefit from a period of outpatient heart monitoring to determine whether in fact he is feeling paroxysmal atrial fibrillation, or frequent atrial ectopy. This will be important since the former may require consideration of antiarrhythmic therapy. I made it clear that if his symptoms abruptly worsen over the weekend, he should seek medical attention sooner.  Corey [name], M.D., F.A.C.C. .

## 2014-04-05 NOTE — ED Provider Notes (Signed)
2110 - Patient care assumed from Dr. Pamella Pert at shift change. Patient presenting for paroxysmal atrial fibrillation with associated chest discomfort. Patient has been seen and cleared by cardiology while in the emergency department. Patient, at shift change, awaiting delta troponin. Plan discussed with Dr. Aline Brochure which includes discharge with primary care and cardiology followup if delta troponin negative.  Labs reviewed and delta troponin also negative. Patient hemodynamically stable and appropriate for discharge. Return precautions provided. Patient agreeable to plan with no unaddressed concerns.   Filed Vitals:   04/05/14 1708 04/05/14 1727 04/05/14 1745  BP: 161/85  129/76  Pulse: 92  82  Temp: 98.2 F (36.8 C)    TempSrc: Oral    Resp: 20  20  Height:  6\' 1"  (1.854 m)   Weight:  256 lb (116.121 kg)   SpO2: 95%  95%     Antonietta Breach, PA-C 04/05/14 2112

## 2014-04-05 NOTE — Telephone Encounter (Signed)
Patient called holiday answering service - just doesn't feel right today. He feels that he's been in AF all day which is unusual for him - usually only goes in and out briefly. HR is in the low 40's at times which correlates with a sense of chest heaviness only transiently. BP ranging 116/70-150/82. H/o stent to RCA 2007. He is on metoprolol.  I told him sometimes if someone goes into AF we have them take an extra dose of their metoprolol but we are not able to do this in his case due to his bradycardia. Given that AF has been bothersome and he is exhibiting bradycardia I advised he proceed to ER for evaluation so we can determine if there are any reversible causes that need to be addressed, or if we need to consider cardioversion. He verbalized understanding and gratitude and will be heading to [facility] with family. Dayna Dunn PA-C

## 2014-04-05 NOTE — ED Provider Notes (Signed)
CSN: [id number]     Arrival date & time 04/05/14  1700 History   First MD Initiated Contact with Patient 04/05/14 1754     Chief Complaint  Patient presents with  . Atrial Fibrillation  . Bradycardia     (Consider location/radiation/quality/duration/timing/severity/associated sxs/prior Treatment) Patient is a 68 y.o. male presenting with atrial fibrillation and chest pain. The history is provided by the patient.  Atrial Fibrillation This is a chronic problem. The current episode started yesterday. Episode frequency: intermittent. The problem has been resolved. Pertinent negatives include no chest pain, no abdominal pain, no headaches and no shortness of breath. Nothing aggravates the symptoms. Nothing relieves the symptoms. He has tried nothing for the symptoms. The treatment provided no relief.  Chest Pain Pain location:  Substernal area Pain quality: pressure   Pain radiates to:  Upper back Pain radiates to the back: yes   Pain severity:  Moderate Onset quality:  Sudden Duration:  30 minutes Timing:  Intermittent Progression:  Resolved Chronicity:  Recurrent Context: at rest   Relieved by:  Nothing Worsened by:  Nothing tried Ineffective treatments:  None tried Associated symptoms: no abdominal pain, no cough, no fever, no headache, no nausea, no numbness, no shortness of breath and not vomiting     Past Medical History  Diagnosis Date  . Hypertension   . Dyslipidemia   . Dyspnea   . Chest pain     Sees Dr. Acie Fredrickson, saw last April 2012  . SOB (shortness of breath)   . GERD (gastroesophageal reflux disease)   . Complication of anesthesia     does have small airway  . PONV (postoperative nausea and vomiting)   . Recurrent upper respiratory infection (URI)     70months ago, treated with claritin D and 5 day dose pack  . Pneumonia     1992  . Sleep apnea     cpap  . Arthritis     back, neck, shoulders  . Coronary artery disease     STATUS POST PTCA AND STENTING OF HIS  RIGHT CORONARY ARTERY  . CVA (cerebral infarction)    Past Surgical History  Procedure Laterality Date  . Cardiac catheterization  12/23/2005    NORMAL CHAMBER SIZE AND NORMAL GLOBAL SYSTOLIC FUNCTION. EF 65%  . Cardiac catheterization  12/24/2004    NORMAL LEFT VENTRICULAR SYSTOLIC FUNCTION.THE LEFT VENTRICLE APPEARS TO BE MILDLY ENLARGED.  . Cardiac catheterization  05/27/2003    MILD CARDIOMEGALY. EF 55-60%  . Cervical fusion  07/2001  . Lumbar fusion  10/2003    L5-S1  . Knee arthroscopy  10/2004    LEFT KNEE  . Replacement total knee      left  . Tonsillectomy    . Coronary stent placement      first Aug 2004, 2nd stent 12/2005  . Cardiovascular stress test      2011  . US echocardiography      2011  . Tee without cardioversion  07/07/2012    Procedure: TRANSESOPHAGEAL ECHOCARDIOGRAM (TEE);  Surgeon: Fay Records, MD;  Location: [facility] ENDOSCOPY;  Service: Cardiovascular;  Laterality: N/A;   Family History  Problem Relation Age of Onset  . Hypertension Mother   . Heart disease Father   . Heart attack Father    History  Substance Use Topics  . Smoking status: Former Smoker -- 1.00 packs/day for 20 years    Types: Cigarettes    Quit date: 10/04/2002  . Smokeless tobacco: Never Used  . Alcohol  Use: No    Review of Systems  Constitutional: Negative for fever.  HENT: Negative for drooling and rhinorrhea.   Eyes: Negative for pain.  Respiratory: Negative for cough and shortness of breath.   Cardiovascular: Negative for chest pain and leg swelling.  Gastrointestinal: Negative for nausea, vomiting, abdominal pain and diarrhea.  Genitourinary: Negative for dysuria and hematuria.  Musculoskeletal: Negative for gait problem and neck pain.  Skin: Negative for color change.  Neurological: Negative for numbness and headaches.  Hematological: Negative for adenopathy.  Psychiatric/Behavioral: Negative for behavioral problems.  All other systems reviewed and are  negative.     Allergies  Codeine; Monopril; and Ampicillin  Home Medications   Prior to Admission medications   Medication Sig Start Date End Date Taking? Authorizing Provider  aspirin 81 MG tablet Take 1 tablet (81 mg total) by mouth daily. 11/30/12   Thayer Headings, MD  atorvastatin (LIPITOR) 40 MG tablet Take 1 tablet (40 mg total) by mouth daily. 05/09/13   Thayer Headings, MD  fenofibrate 160 MG tablet TAKE 1 TABLET EVERY DAY 08/08/13   Josue Hector, MD  fluticasone [facility]) 50 MCG/ACT nasal spray Place 2 sprays into the nose daily.      Historical Provider, MD  lisinopril (PRINIVIL,ZESTRIL) 20 MG tablet TAKE 1 TABLET BY MOUTH EVERY DAY 11/26/13   Thayer Headings, MD  metoprolol succinate (TOPROL-XL) 100 MG 24 hr tablet TAKE 1 TABLET (100 MG TOTAL) BY MOUTH DAILY. 12/06/13   Thayer Headings, MD  Multiple Vitamins-Minerals (MULTIVITAMINS THER. W/MINERALS) TABS Take 1 tablet by mouth daily.      Historical Provider, MD  NITROSTAT 0.4 MG SL tablet PLACE 1 TABLET (0.4 MG TOTAL) UNDER THE TONGUE EVERY 5 (FIVE) MINUTES AS NEEDED. FOR CHEST PAIN    Thayer Headings, MD  omeprazole (PRILOSEC) 10 MG capsule TAKE ONE CAPSULE BY MOUTH EVERY DAY 12/06/13   Thayer Headings, MD  OVER THE COUNTER MEDICATION Place 1-2 sprays into the nose daily as needed. "Pure-mist", for nasal congestion    Historical Provider, MD  PROAIR HFA 108 (90 BASE) MCG/ACT inhaler Inhale 2 puffs into the lungs every 4 (four) hours as needed. 03/03/14   Historical Provider, MD  Tamsulosin HCl (FLOMAX) 0.4 MG CAPS Take 0.4 mg by mouth daily.      Historical Provider, MD  XARELTO 20 MG TABS tablet TAKE 1 TABLET BY MOUTH EVERY DAY **STOP PLAVIX, REDUCE ASPIRIN TO 81 MG**    Thayer Headings, MD   BP 161/85  Pulse 92  Temp(Src) 98.2 F (36.8 C) (Oral)  Resp 20  Ht 6\' 1"  (1.854 m)  Wt 256 lb (116.121 kg)  BMI 33.78 kg/m2  SpO2 95% Physical Exam  Nursing note and vitals reviewed. Constitutional: He is oriented to person, place,  and time. He appears well-developed and well-nourished.  HENT:  Head: Normocephalic and atraumatic.  Right Ear: External ear normal.  Left Ear: External ear normal.  Nose: Nose normal.  Mouth/Throat: Oropharynx is clear and moist. No oropharyngeal exudate.  Eyes: Conjunctivae and EOM are normal. Pupils are equal, round, and reactive to light.  Neck: Normal range of motion. Neck supple.  Cardiovascular: Normal rate, regular rhythm, normal heart sounds and intact distal pulses.  Exam reveals no gallop and no friction rub.   No murmur heard. Pulmonary/Chest: Effort normal and breath sounds normal. No respiratory distress. He has no wheezes.  Abdominal: Soft. Bowel sounds are normal. He exhibits no distension. There is  no tenderness. There is no rebound and no guarding.  Musculoskeletal: Normal range of motion. He exhibits no edema and no tenderness.  Neurological: He is alert and oriented to person, place, and time.  Skin: Skin is warm and dry.  Psychiatric: He has a normal mood and affect. His behavior is normal.    ED Course  Procedures (including critical care time) Labs Review Labs Reviewed  BASIC METABOLIC PANEL - Abnormal; Notable for the following:    Glucose, Bld 122 (*)    GFR calc non Af Amer 56 (*)    GFR calc Af Amer 65 (*)    All other components within normal limits  CBC  TSH  T4, FREE  TROPONIN I  I-STAT TROPOININ, ED    Imaging Review Dg Chest 2 View  04/05/2014   CLINICAL DATA:  Chest pressure with history of atrial fibrillation and hypertension and previous tobacco use  EXAM: CHEST  2 VIEW  COMPARISON:  Portable chest x-ray of October 31, 2012  FINDINGS: The lungs are adequately inflated. There is no focal infiltrate. The heart is top-normal in size. The pulmonary vascularity is not engorged. There is no pleural effusion. The bony thorax is unremarkable.  IMPRESSION: There is no acute cardiopulmonary abnormality.   Electronically Signed   By: David  Martinique   On:  04/05/2014 18:05     EKG Interpretation   Date/Time:  Friday April 05 2014 17:04:51 EDT Ventricular Rate:  93 PR Interval:  192 QRS Duration: 94 QT Interval:  368 QTC Calculation: 457 R Axis:   25 Text Interpretation:  Normal sinus rhythm Normal ECG No significant change  since last tracing Confirmed by Thurman Sarver  MD, Betzaira Mentel ([id number]) on 04/05/2014  5:55:26 PM      MDM   Final diagnoses:  Paroxysmal a-fib  Other chest pain    6:02 PM 68 y.o. male w a hx of paroxysmal afib, CAD s/p PCI on xarelto who presents with worsening intermittent A. fib over the last 6 weeks and chest pain. He notes he has had acute worsening of his symptoms today and developed some chest pressure which lasted approximately 30 minutes earlier in the day. He is currently asymptomatic on exam. He is afebrile and vital signs are unremarkable here. Screening labs and imaging were noncontributory. Will consult cardiology.   Cards has seen pt and recommends d/c home. Delta trop neg. Pt remains asx. I have discussed the diagnosis/risks/treatment options with the patient and family and believe the pt to be eligible for discharge home to follow-up with Dr. Acie Fredrickson on Monday as scheduled. We also discussed returning to the ED immediately if new or worsening sx occur. We discussed the sx which are most concerning (e.g., worsening palpitations, cp, sob, fever) that necessitate immediate return. Medications administered to the patient during their visit and any new prescriptions provided to the patient are listed below.  Medications given during this visit Medications  aspirin tablet 325 mg (325 mg Oral Given 04/05/14 1844)    Discharge Medication List as of 04/05/2014  8:28 PM         Shea Evans Ruthe Mannan, MD 04/06/14 1056

## 2014-04-05 NOTE — ED Notes (Signed)
Reports chest heaviness and discomfort earlier today.

## 2014-04-05 NOTE — ED Notes (Signed)
Patient transported to X-ray 

## 2014-04-06 ENCOUNTER — Telehealth: Payer: Self-pay | Admitting: Physician Assistant

## 2014-04-06 LAB — T4, FREE: Free T4: 0.97 ng/dL (ref 0.80–1.80)

## 2014-04-06 NOTE — ED Provider Notes (Signed)
Medical screening examination/treatment/procedure(s) were performed by non-physician practitioner and as supervising physician I was immediately available for consultation/collaboration.   EKG Interpretation   Date/Time:  Friday April 05 2014 17:04:51 EDT Ventricular Rate:  93 PR Interval:  192 QRS Duration: 94 QT Interval:  368 QTC Calculation: 457 R Axis:   25 Text Interpretation:  Normal sinus rhythm Normal ECG No significant change  since last tracing Confirmed by HARRISON  MD, FORREST ([id number]) on 04/05/2014  5:55:26 PM        Delora Fuel, MD 12/02/29 [id number]

## 2014-04-06 NOTE — Telephone Encounter (Signed)
Please let patient know thyroid function was normal, checked in ER. Martin Majestic home from ED.) Melina Copa PA-C

## 2014-04-08 ENCOUNTER — Telehealth: Payer: Self-pay | Admitting: *Deleted

## 2014-04-08 ENCOUNTER — Ambulatory Visit (INDEPENDENT_AMBULATORY_CARE_PROVIDER_SITE_OTHER): Payer: Medicare Other | Admitting: Cardiovascular Disease

## 2014-04-08 ENCOUNTER — Encounter: Payer: Self-pay | Admitting: Cardiovascular Disease

## 2014-04-08 VITALS — BP 132/83 | HR 59 | Ht 73.0 in | Wt 261.4 lb

## 2014-04-08 DIAGNOSIS — I2510 Atherosclerotic heart disease of native coronary artery without angina pectoris: Secondary | ICD-10-CM

## 2014-04-08 DIAGNOSIS — R079 Chest pain, unspecified: Secondary | ICD-10-CM

## 2014-04-08 DIAGNOSIS — E785 Hyperlipidemia, unspecified: Secondary | ICD-10-CM

## 2014-04-08 LAB — BASIC METABOLIC PANEL
BUN: 27 mg/dL — ABNORMAL HIGH (ref 6–23)
CO2: 25 mEq/L (ref 19–32)
Calcium: 9.6 mg/dL (ref 8.4–10.5)
Chloride: 108 mEq/L (ref 96–112)
Creatinine, Ser: 1.1 mg/dL (ref 0.4–1.5)
GFR: 68.16 mL/min (ref >60.00)
Glucose, Bld: 117 mg/dL — ABNORMAL HIGH (ref 70–99)
Potassium: 5 mEq/L (ref 3.5–5.1)
Sodium: 138 mEq/L (ref 135–145)

## 2014-04-08 LAB — LIPID PANEL
Cholesterol: 120 mg/dL (ref 0–200)
HDL: 30.4 mg/dL — ABNORMAL LOW (ref >39.00)
LDL Cholesterol: 60 mg/dL (ref 0–99)
NonHDL: 89.6
Total CHOL/HDL Ratio: 4
Triglycerides: 146 mg/dL (ref 0.0–149.0)
VLDL: 29.2 mg/dL (ref 0.0–40.0)

## 2014-04-08 LAB — HEPATIC FUNCTION PANEL
ALT: 26 U/L (ref 0–53)
AST: 28 U/L (ref 0–37)
Albumin: 4.1 g/dL (ref 3.5–5.2)
Alkaline Phosphatase: 46 U/L (ref 39–117)
Bilirubin, Direct: 0 mg/dL (ref 0.0–0.3)
Total Bilirubin: 0.5 mg/dL (ref 0.2–1.2)
Total Protein: 7.3 g/dL (ref 6.0–8.3)

## 2014-04-08 NOTE — Telephone Encounter (Signed)
pt notified about lab work thyroid function from the ED per Melina Copa, PA Thyroid function normal, pt vebalized understanding to these results goven today.

## 2014-04-08 NOTE — Assessment & Plan Note (Addendum)
Corey [name] seems to be doing OK. He's been having lots of arrhythmias. He seems to have more palpitations and a bit after he exercises. This could be due to worsening coronary artery disease.  We'll get a stress Myoview study for further evaluation. He has not had to take any nitroglycerin.  He's having lots of palpitations that he thought were due to 2 atrial fibrillation. In the emergency room, he was found to have multiple premature atrial contractions. We talked about getting a new-That records heart rate. I will see him  again in 3 months for followup visit

## 2014-04-08 NOTE — Patient Instructions (Signed)
Your physician recommends that you have lab work:  TODAY  Your physician recommends that you continue on your current medications as directed. Please refer to the Current Medication list given to you today.  Your physician has requested that you have en exercise stress myoview. For further information please visit [email or web address]. Please follow instruction sheet, as given.  Your physician wants you to follow-up in: 3 months with Dr. Acie Fredrickson.  You will receive a reminder letter in the mail two months in advance. If you don't receive a letter, please call our office to schedule the follow-up appointment.

## 2014-04-08 NOTE — Progress Notes (Signed)
Corey [name] Date of Birth  1946-03-25       [facility] Office [address], [place], [place]  [address]   [place], [place]  [address] [phone]     [phone]   Fax  [phone]    Fax [phone]  Problem List: 1. Coronary artery disease-status post PTCA and stenting of his right coronary artery 2. Hypertension 3. Hyperlipidemia 4. 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 Atrial fibrillation - discovered after his stroke  History of Present Illness: Corey [name] is a 68 yo gentleman with a history of coronary artery disease. Status post PTCA and stenting of his right coronary artery. He also has a history of hypertension, and hyperlipidemia.  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  He had a stroke in Oct.  He had some speech problems and loss of fine motor control of the right arm.  TEE was negative for PFO or LAA thrombus.   Event monitor showed no A-Fib.  He has been on Plavix since his stents in  [name] 4, 2014:  Corey [name] had an episode of atrial fib in January.  He was started on Xarelto and has done well.  He has had some fatigue on occasion.  He monitors his BP.    He has recovered from his stroke.    Feb. 13, 2015:  Corey [name] is doing ok.  Still having touble losing weight.  Has had a URI for the past month or so. No CP, no palps.  Has recovered well from his CVA.  He is looking forward to being able to exercise more   Current Outpatient Prescriptions on File Prior to Visit  Medication Sig Dispense Refill  . acetaminophen (TYLENOL) 325 MG tablet Take 325-650 mg by mouth every 6 (six) hours as needed (pain).      Marland Kitchen aspirin EC 81 MG tablet Take 81 mg by mouth daily.      Marland Kitchen atorvastatin (LIPITOR) 40 MG tablet Take 1 tablet (40 mg total) by mouth daily.  90 tablet  3  . diclofenac sodium (VOLTAREN) 1 % GEL Apply 4 g topically 4 (four) times daily as needed (for pain / inflammation).      .  fenofibrate 160 MG tablet Take 160 mg by mouth daily.      . fluticasone (FLONASE) 50 MCG/ACT nasal spray Place 2 sprays into the nose daily.        Marland Kitchen lisinopril (PRINIVIL,ZESTRIL) 20 MG tablet Take 20 mg by mouth daily.      . metoprolol (TOPROL-XL) 200 MG 24 hr tablet Take 100 mg by mouth daily.       . Multiple Vitamins-Minerals (MULTIVITAMINS THER. W/MINERALS) TABS Take 1 tablet by mouth daily.        . nitroGLYCERIN (NITROSTAT) 0.4 MG SL tablet Place 0.4 mg under the tongue every 5 (five) minutes as needed for chest pain.      Marland Kitchen omeprazole (PRILOSEC) 10 MG capsule Take 10 mg by mouth daily.      . rivaroxaban (XARELTO) 20 MG TABS tablet Take 20 mg by mouth daily with supper.      . Tamsulosin HCl (FLOMAX) 0.4 MG CAPS Take 0.4 mg by mouth daily.         No current facility-administered medications on file prior to visit.    Allergies  Allergen Reactions  . Codeine Nausea Only  Heavy amounts cause nausea  . Monopril [Fosinopril Sodium] Other (See Comments)    Muscle aches & pain  . Ampicillin Rash    Past Medical History  Diagnosis Date  . Essential hypertension, benign   . Dyslipidemia   . GERD (gastroesophageal reflux disease)   . Recurrent upper respiratory infection (URI)   . Sleep apnea     CPAP  . Arthritis   . Coronary atherosclerosis of native coronary artery     a. BMS to [facility] 2004 and 2007, otherwise mild nonobstructive disease. EF normal.  . CVA (cerebral infarction)     a. 07/2012 - AF discovered after stroke.  . Paroxysmal atrial fibrillation     a. Discovered after stroke.  . Obesity     Past Surgical History  Procedure Laterality Date  . Cervical fusion  07/2001  . Lumbar fusion  10/2003    L5-S1  . Knee arthroscopy  10/2004    LEFT KNEE  . Replacement total knee      left  . Tonsillectomy    . Tee without cardioversion  07/07/2012    Procedure: TRANSESOPHAGEAL ECHOCARDIOGRAM (TEE);  Surgeon: Fay Records, MD;  Location: [facility] ENDOSCOPY;  Service:  Cardiovascular;  Laterality: N/A;    History  Smoking status  . Former Smoker -- 1.00 packs/day for 20 years  . Types: Cigarettes  . Quit date: 05/05/2003  Smokeless tobacco  . Never Used    History  Alcohol Use No    Family History  Problem Relation Age of Onset  . Hypertension Mother   . Heart disease Father   . Heart attack Father     Reviw of Systems:  Reviewed in the HPI.  All other systems are negative.  Physical Exam: Blood pressure 132/83, pulse 59, height 6\' 1"  (1.854 m), weight 261 lb 6.4 oz (118.57 kg). General: Well developed, well nourished, in no acute distress.  Head: Normocephalic, atraumatic, sclera non-icteric, mucus membranes are moist,  Neck: Supple. Carotids are 2 + without bruits. No JVD Lungs: Clear bilaterally to auscultation. Heart: regular rate.  normal  S1 S2. No murmurs, gallops or rubs. Abdomen: Soft, non-tender, non-distended with normal bowel sounds. No hepatomegaly. No rebound/guarding. No masses. Msk:  Strength and tone are normal Extremities: No clubbing or cyanosis. No edema.  Distal pedal pulses are 2+ and equal bilaterally. Neuro: Alert and oriented X 3. Moves all extremities spontaneously. Psych:  Responds to questions appropriately with a normal affect.  ECG: April 08, 2014:  Sinus bradycardia at 59. He has no dizziness. He has no ST or T wave changes.   Assessment / Plan:

## 2014-04-22 ENCOUNTER — Ambulatory Visit (HOSPITAL_COMMUNITY): Payer: Medicare Other | Attending: Internal Medicine | Admitting: Radiology

## 2014-04-22 VITALS — BP 138/78 | HR 70 | Ht 73.0 in | Wt 259.0 lb

## 2014-04-22 DIAGNOSIS — R0602 Shortness of breath: Secondary | ICD-10-CM | POA: Insufficient documentation

## 2014-04-22 DIAGNOSIS — Z8673 Personal history of transient ischemic attack (TIA), and cerebral infarction without residual deficits: Secondary | ICD-10-CM | POA: Insufficient documentation

## 2014-04-22 DIAGNOSIS — Z9861 Coronary angioplasty status: Secondary | ICD-10-CM | POA: Insufficient documentation

## 2014-04-22 DIAGNOSIS — G4733 Obstructive sleep apnea (adult) (pediatric): Secondary | ICD-10-CM | POA: Insufficient documentation

## 2014-04-22 DIAGNOSIS — R5383 Other fatigue: Secondary | ICD-10-CM | POA: Insufficient documentation

## 2014-04-22 DIAGNOSIS — Z8249 Family history of ischemic heart disease and other diseases of the circulatory system: Secondary | ICD-10-CM | POA: Insufficient documentation

## 2014-04-22 DIAGNOSIS — Z87891 Personal history of nicotine dependence: Secondary | ICD-10-CM | POA: Insufficient documentation

## 2014-04-22 DIAGNOSIS — R5381 Other malaise: Secondary | ICD-10-CM | POA: Insufficient documentation

## 2014-04-22 DIAGNOSIS — I4891 Unspecified atrial fibrillation: Secondary | ICD-10-CM

## 2014-04-22 DIAGNOSIS — I2510 Atherosclerotic heart disease of native coronary artery without angina pectoris: Secondary | ICD-10-CM

## 2014-04-22 DIAGNOSIS — I10 Essential (primary) hypertension: Secondary | ICD-10-CM | POA: Insufficient documentation

## 2014-04-22 DIAGNOSIS — R079 Chest pain, unspecified: Secondary | ICD-10-CM

## 2014-04-22 DIAGNOSIS — M549 Dorsalgia, unspecified: Secondary | ICD-10-CM | POA: Insufficient documentation

## 2014-04-22 MED ORDER — TECHNETIUM TC 99M SESTAMIBI GENERIC - CARDIOLITE
33.0000 | Freq: Once | INTRAVENOUS | Status: AC | PRN
Start: 2014-04-22 — End: 2014-04-22
  Administered 2014-04-22: 33 via INTRAVENOUS

## 2014-04-22 MED ORDER — TECHNETIUM TC 99M SESTAMIBI GENERIC - CARDIOLITE
11.0000 | Freq: Once | INTRAVENOUS | Status: AC | PRN
Start: 2014-04-22 — End: 2014-04-22
  Administered 2014-04-22: 11 via INTRAVENOUS

## 2014-04-22 NOTE — Progress Notes (Signed)
[facility] [address] [place], [place] [address] [phone]    Cardiology Nuclear Med Study  Corey [name] is a 68 y.o. male     MRN : [id number]     DOB: 1946/06/27  Procedure Date: 04/22/2014  Nuclear Med Background Indication for Stress Test:  Evaluation for Ischemia and Stent Patency History:  CAD, Cath, Stent (RCA), Afib, OSA, Echo 2013 EF 55-60%, MPI 2011 (ok per pt) Cardiac Risk Factors: Carotid Disease, CVA, Family History - CAD, History of Smoking, Hypertension and Lipids  Symptoms:  Fatigue, SOB and Upper back pain at rest   Nuclear Pre-Procedure Caffeine/Decaff Intake:  None> 12 hrs NPO After: 6:00pm   Lungs:  clear O2 Sat: 95% on room air. IV 0.9% NS with Angio Cath:  22g  IV Site: R Antecubital x 1, tolerated well IV Started by:  Irven Baltimore, RN  Chest Size (in):  50 Cup Size: n/a  Height: 6\' 1"  (1.854 m)  Weight:  259 lb (117.482 kg)  BMI:  Body mass index is 34.18 kg/(m^2). Tech Comments:  Patient held Toprol x 36 hrs. Irven Baltimore, RN.    Nuclear Med Study 1 or 2 day study: 1 day  Stress Test Type:  Stress  Reading MD: N/A  Order Authorizing Provider:  Mertie Moores, MD  Resting Radionuclide: Technetium 53m Sestamibi  Resting Radionuclide Dose: 11.0 mCi   Stress Radionuclide:  Technetium 51m Sestamibi  Stress Radionuclide Dose: 33.0 mCi           Stress Protocol Rest HR: 70 Stress HR: 157  Rest BP: 138/78 Stress BP: 134/65  Exercise Time (min): 6:00 METS: 7.0           Dose of Adenosine (mg):  n/a Dose of Lexiscan: n/a mg  Dose of Atropine (mg): n/a Dose of Dobutamine: n/a mcg/kg/min (at max HR)  Stress Test Technologist: Glade Lloyd, BS-ES  Nuclear Technologist:  Vedia Pereyra, CNMT     Rest Procedure:  Myocardial perfusion imaging was performed at rest 45 minutes following the intravenous administration of Technetium 2m Sestamibi. Rest ECG: NSR - Normal EKG  Stress Procedure:  The patient exercised on  the treadmill utilizing the Bruce Protocol for 6:00 minutes. The patient stopped due to fatigue and denied any chest pain.  Technetium 86m Sestamibi was injected at peak exercise and myocardial perfusion imaging was performed after a brief delay. Stress ECG: EKG difficult to interpret due to significant wandering baseline artifact.  There was less than 86mm of horizontal to upsloping ST segment depression it the inferolateral leads at peak exercise  QPS Raw Data Images:  Mild diaphragmatic attenuation.  Normal left ventricular size. Stress Images:  There is decreased uptake in the inferior wall. Rest Images:  Normal homogeneous uptake in all areas of the myocardium. Subtraction (SDS):  Very small area of faint reduced uptake in the inferoapical wall Transient Ischemic Dilatation (Normal <1.22):  0.94 Lung/Heart Ratio (Normal <0.45):  0.35  Quantitative Gated Spect Images QGS EDV:  112 ml QGS ESV:  38 ml  Impression Exercise Capacity:  Good exercise capacity. BP Response:  Normal blood pressure response. Clinical Symptoms:  No significant symptoms noted. ECG Impression:  Wandering baseline artifact made EKG interpretation difficult.  Suggestive of 37mm of horizontal to upsloping ST segment depression but this could be due to baseline artifact Comparison with Prior Nuclear Study: No images to compare  Overall Impression:  Low risk stress nuclear study Very small area of reversibility  in the inferoapex that most likely is due to variations in diaphragmatic attenuation between rest and stress images.  LV Ejection Fraction: 66%.  LV Wall Motion:  NL LV Function; NL Wall Motion   Signed: Fransico Him, MD [place]

## 2014-04-26 ENCOUNTER — Telehealth: Payer: Self-pay | Admitting: Cardiovascular Disease

## 2014-04-26 NOTE — Telephone Encounter (Signed)
New message ° ° ° ° °Want stress test results °

## 2014-04-26 NOTE — Telephone Encounter (Signed)
Pt is aware of  the preliminary stress test results. Pt is aware that MD will review results and make recommendation if needed. Pt verbalized understanding.

## 2014-04-30 ENCOUNTER — Ambulatory Visit: Payer: Medicare Other | Admitting: Nurse Practitioner

## 2014-04-30 ENCOUNTER — Encounter: Payer: Self-pay | Admitting: Internal Medicine

## 2014-04-30 NOTE — Telephone Encounter (Signed)
Called patient's cell and spoke with patient's wife who [place]; advised her that Dr. Acie Fredrickson has read stress test as low risk and no further orders needed.  Patient is to f/u as directed.  Patient's wife verbalized understanding and agreement.

## 2014-05-09 ENCOUNTER — Encounter: Payer: Self-pay | Admitting: Nurse Practitioner

## 2014-05-09 ENCOUNTER — Ambulatory Visit (INDEPENDENT_AMBULATORY_CARE_PROVIDER_SITE_OTHER): Payer: Medicare Other | Admitting: Nurse Practitioner

## 2014-05-09 VITALS — BP 114/66 | HR 69 | Ht 73.0 in | Wt 268.0 lb

## 2014-05-09 DIAGNOSIS — I6789 Other cerebrovascular disease: Secondary | ICD-10-CM

## 2014-05-09 NOTE — Progress Notes (Signed)
PATIENT: Corey [name] DOB: 03/02/1946  REASON FOR VISIT: routine follow up for stroke HISTORY FROM: patient  HISTORY OF PRESENT ILLNESS: 68 y.o. 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 Vascular risk factors of hypertension, hyperlipidemia, atrial fibrillation and cardiac disease.   05/30/2013 he is seen today for followup of the last visit on 11/10/12. He is tolerating Xarelto well without significant bruising normally occasional minor bleeding. He continues to have mild occasional word finding difficulties particularly when tired. He has not had any recurrent stroke or TIA symptoms. He had carotid ultrasound done in [name] by Dr. Acie Fredrickson which was fine but I do not have the results. He has no new complaints today.   05/09/14 (LL): Since last visit, patient has been well.  He has not had any recurrent stroke or TIA symptoms. Cholesterol is well controlled, it is total 120 and LDL 60, checked last month. Recent Myocardial Perfusion study  Results were good.  Last carotid doppler study completed at cardiology office in November 2014 showed no significant stenosis.  Blood pressure is well controlled, it is 114/66 in the office today.  He wore a heart monitor in June and was found to have PACs. He states he is compliant with his cpap every night.  He is working on weight loss with increased activity, lower carb intake and smaller portions. He is tolerating Xarelto well without any significant bleeding or bruising. There no new complaints today.  ROS:  14 system review of systems is positive for joint pain,, back pain, neck pain, apnea and snoring.  ALLERGIES: Allergies  Allergen Reactions  . Codeine Nausea Only    Heavy amounts cause nausea  . Monopril [Fosinopril Sodium] Other (See Comments)    Muscle aches & pain  . Ampicillin Rash    HOME MEDICATIONS: Outpatient Prescriptions Prior to Visit  Medication Sig  Dispense Refill  . acetaminophen (TYLENOL) 325 MG tablet Take 325-650 mg by mouth every 6 (six) hours as needed (pain).      Marland Kitchen aspirin EC 81 MG tablet Take 81 mg by mouth daily.      Marland Kitchen atorvastatin (LIPITOR) 40 MG tablet Take 1 tablet (40 mg total) by mouth daily.  90 tablet  3  . diclofenac sodium (VOLTAREN) 1 % GEL Apply 4 g topically 4 (four) times daily as needed (for pain / inflammation).      . fenofibrate 160 MG tablet Take 160 mg by mouth daily.      . fluticasone (FLONASE) 50 MCG/ACT nasal spray Place 2 sprays into the nose daily.        Marland Kitchen lisinopril (PRINIVIL,ZESTRIL) 20 MG tablet Take 20 mg by mouth daily.      . Multiple Vitamins-Minerals (MULTIVITAMINS THER. W/MINERALS) TABS Take 1 tablet by mouth daily.        . nitroGLYCERIN (NITROSTAT) 0.4 MG SL tablet Place 0.4 mg under the tongue every 5 (five) minutes as needed for chest pain.      Marland Kitchen omeprazole (PRILOSEC) 10 MG capsule Take 10 mg by mouth daily.      . rivaroxaban (XARELTO) 20 MG TABS tablet Take 20 mg by mouth daily with supper.      . Tamsulosin HCl (FLOMAX) 0.4 MG CAPS Take 0.4 mg by mouth daily.        . metoprolol (TOPROL-XL) 200 MG 24 hr tablet Take 100 mg by mouth daily.  No facility-administered medications prior to visit.    PHYSICAL EXAM Filed Vitals:   05/09/14 0951  BP: 114/66  Pulse: 69  Height: 6\' 1"  (1.854 m)  Weight: 268 lb (121.564 kg)   Body mass index is 35.37 kg/(m^2).  Physical Exam  General: well developed, well nourished, seated, in no evident distress  Head: head normocephalic and atraumatic. Orohparynx benign  Neck: supple with no carotid or supraclavicular bruits  Cardiovascular: regular rate and rhythm, no murmurs  Musculoskeletal: no deformity  Skin: no rash/petichiae. Left knee surgical scar from knee replacement.  Vascular: Normal pulses all extremities   Neurologic Exam  Mental Status: Awake and fully alert. Oriented to place and time. Recent and remote memory intact.  Attention span, concentration and fund of knowledge appropriate. Mood and affect appropriate.  Cranial Nerves: Fundoscopic exam not done. Pupils equal, briskly reactive to light. Extraocular movements full without nystagmus. Visual fields full to confrontation. Hearing intact. Facial sensation intact. Face, tongue, palate moves normally and symmetrically.  Motor: Normal bulk and tone. Normal strength in all tested extremity muscles.  Sensory.: intact to tough and pinprick and vibratory.  Coordination: Rapid alternating movements normal in all extremities. Finger-to-nose and heel-to-shin performed accurately bilaterally.  Gait and Station: Arises from chair without difficulty. Stance is normal. Gait demonstrates normal stride length and balance . Able to heel, toe and tandem walk without difficulty.  Reflexes: 1+ and symmetric. Toes downgoing.   DIAGNOSTIC DATA (LABS, IMAGING, TESTING) - I reviewed patient records, labs, notes, testing and imaging myself where available.  Lab Results  Component Value Date   WBC 5.9 04/05/2014   HGB 13.4 04/05/2014   HCT 41.6 04/05/2014   MCV 89.1 04/05/2014   PLT 238 04/05/2014      Component Value Date/Time   NA 138 04/08/2014 0912   K 5.0 04/08/2014 0912   CL 108 04/08/2014 0912   CO2 25 04/08/2014 0912   GLUCOSE 117* 04/08/2014 0912   BUN 27* 04/08/2014 0912   CREATININE 1.1 04/08/2014 0912   CALCIUM 9.6 04/08/2014 0912   PROT 7.3 04/08/2014 0912   ALBUMIN 4.1 04/08/2014 0912   AST 28 04/08/2014 0912   ALT 26 04/08/2014 0912   ALKPHOS 46 04/08/2014 0912   BILITOT 0.5 04/08/2014 0912   GFRNONAA 56* 04/05/2014 1730   GFRAA 65* 04/05/2014 1730   Lab Results  Component Value Date   CHOL 120 04/08/2014   HDL 30.40* 04/08/2014   LDLCALC 60 04/08/2014   TRIG 146.0 04/08/2014   CHOLHDL 4 04/08/2014    ASSESSMENT: 68 y.o. 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 Vascular risk factors of hypertension,  hyperlipidemia, atrial fibrillation, Obstructive sleep apnea on cpap and cardiac disease. He has done very well, working hard now to eat more healthy, control portion sizes and exercise several days a week to loose weight.   PLAN:  Continue xarelto for atrial fibrillation and stroke prevention and strict control of hypertension with blood pressure goal below 140/90 and lipids with LDL cholesterol goal below 100 mg percent. Commended his on his weight loss and fitness efforts and compliance with cpap. Follow up in 1 year, sooner as needed.  Rudi Rummage Corey Minella, MSN, FNP-BC, A/GNP-C 05/09/2014, 10:26 AM [name] [address] [place], [place] [address] [phone]  Note: This document was prepared with digital dictation and possible smart phrase technology. Any transcriptional errors that result from this process are unintentional.

## 2014-05-09 NOTE — Patient Instructions (Signed)
Continue xarelto for atrial fibrillation and stroke prevention and strict control of hypertension with blood pressure goal below 140/90 and lipids with LDL cholesterol goal below 100 mg percent.  Follow up in 1 year, sooner as needed.  Stroke Prevention Some medical conditions and behaviors are associated with an increased chance of having a stroke. You may prevent a stroke by making healthy choices and managing medical conditions. HOW CAN I REDUCE MY RISK OF HAVING A STROKE?   Stay physically active. Get at least 30 minutes of activity on most or all days.  Do not smoke. It may also be helpful to avoid exposure to secondhand smoke.  Limit alcohol use. Moderate alcohol use is considered to be:  No more than 2 drinks per day for men.  No more than 1 drink per day for nonpregnant women.  Eat healthy foods. This involves:  Eating 5 or more servings of fruits and vegetables a day.  Making dietary changes that address high blood pressure (hypertension), high cholesterol, diabetes, or obesity.  Manage your cholesterol levels.  Making food choices that are high in fiber and low in saturated fat, trans fat, and cholesterol may control cholesterol levels.  Take any prescribed medicines to control cholesterol as directed by your health care provider.  Manage your diabetes.  Controlling your carbohydrate and sugar intake is recommended to manage diabetes.  Take any prescribed medicines to control diabetes as directed by your health care provider.  Control your hypertension.  Making food choices that are low in salt (sodium), saturated fat, trans fat, and cholesterol is recommended to manage hypertension.  Take any prescribed medicines to control hypertension as directed by your health care provider.  Maintain a healthy weight.  Reducing calorie intake and making food choices that are low in sodium, saturated fat, trans fat, and cholesterol are recommended to manage weight.  Stop  drug abuse.  Avoid taking birth control pills.  Talk to your health care provider about the risks of taking birth control pills if you are over 49 years old, smoke, get migraines, or have ever had a blood clot.  Get evaluated for sleep disorders (sleep apnea).  Talk to your health care provider about getting a sleep evaluation if you snore a lot or have excessive sleepiness.  Take medicines only as directed by your health care provider.  For some people, aspirin or blood thinners (anticoagulants) are helpful in reducing the risk of forming abnormal blood clots that can lead to stroke. If you have the irregular heart rhythm of atrial fibrillation, you should be on a blood thinner unless there is a good reason you cannot take them.  Understand all your medicine instructions.  Make sure that other conditions (such as anemia or atherosclerosis) are addressed. SEEK IMMEDIATE MEDICAL CARE IF:   You have sudden weakness or numbness of the face, arm, or leg, especially on one side of the body.  Your face or eyelid droops to one side.  You have sudden confusion.  You have trouble speaking (aphasia) or understanding.  You have sudden trouble seeing in one or both eyes.  You have sudden trouble walking.  You have dizziness.  You have a loss of balance or coordination.  You have a sudden, severe headache with no known cause.  You have new chest pain or an irregular heartbeat. Any of these symptoms may represent a serious problem that is an emergency. Do not wait to see if the symptoms will go away. Get medical help at once.  Call your local emergency services (911 in [place] Do not drive yourself to the hospital. Document Released: 10/28/2004 Document Revised: 02/04/2014 Document Reviewed: 03/23/2013 [facility] Patient Information 2015 [place], [place]. This information is not intended to replace advice given to you by your health care provider. Make sure you discuss any questions you have  with your health care provider.

## 2014-05-09 NOTE — Progress Notes (Signed)
I agree with the above plan 

## 2014-05-12 ENCOUNTER — Other Ambulatory Visit: Payer: Self-pay | Admitting: Cardiovascular Disease

## 2014-05-23 ENCOUNTER — Encounter: Payer: Self-pay | Admitting: Pulmonary Disease

## 2014-05-23 ENCOUNTER — Ambulatory Visit (INDEPENDENT_AMBULATORY_CARE_PROVIDER_SITE_OTHER): Payer: Medicare Other | Admitting: Pulmonary Disease

## 2014-05-23 VITALS — BP 110/68 | HR 61 | Temp 98.1°F | Ht 73.0 in | Wt 272.4 lb

## 2014-05-23 DIAGNOSIS — G4733 Obstructive sleep apnea (adult) (pediatric): Secondary | ICD-10-CM

## 2014-05-23 NOTE — Progress Notes (Signed)
   Subjective:    Patient ID: Corey [name], male    DOB: [birth year], 68 y.o.   MRN: [id number]  HPI The patient comes in today for followup of his obstructive sleep apnea. He was started on CPAP at the last visit, and has done very well with the device. He is sleeping very well, with significant improvement in his daytime alertness. He is having no significant mask issues, and his download shows minimal leak. He is having some issues at times with the higher pressures, but a review of his download shows that his AHI is barely adequately controlled.   Review of Systems  Constitutional: Negative for fever and unexpected weight change.  HENT: Negative for congestion, dental problem, ear pain, nosebleeds, postnasal drip, rhinorrhea, sinus pressure, sneezing, sore throat and trouble swallowing.   Eyes: Negative for redness and itching.  Respiratory: Negative for cough, chest tightness, shortness of breath and wheezing.   Cardiovascular: Negative for palpitations and leg swelling.  Gastrointestinal: Negative for nausea and vomiting.  Genitourinary: Negative for dysuria.  Musculoskeletal: Negative for joint swelling.  Skin: Negative for rash.  Neurological: Negative for headaches.  Hematological: Does not bruise/bleed easily.  Psychiatric/Behavioral: Negative for dysphoric mood. The patient is not nervous/anxious.        Objective:   Physical Exam Overweight male in no acute distress Nose without purulence or discharge noted Neck without lymphadenopathy or thyromegaly No skin breakdown or pressure necrosis from the CPAP mask Lower extremities without edema, no cyanosis. Alert and oriented, moves all 4.        Assessment & Plan:

## 2014-05-23 NOTE — Patient Instructions (Signed)
Continue with cpap, and let me know if having issues with tolerance. Work on weight loss followup with me again in 46mos.

## 2014-05-23 NOTE — Assessment & Plan Note (Signed)
The patient is doing very well on CPAP, and feels that it has made a considerable difference to his sleep and daytime alertness. He is willing to keep working with the current pressure, and I've also encouraged him to work aggressively on weight loss

## 2014-06-14 ENCOUNTER — Other Ambulatory Visit: Payer: Self-pay | Admitting: *Deleted

## 2014-06-14 MED ORDER — METOPROLOL SUCCINATE ER 100 MG PO TB24: 100.0000 mg | ORAL_TABLET | Freq: Every day | ORAL | Status: DC

## 2014-06-25 ENCOUNTER — Other Ambulatory Visit (HOSPITAL_COMMUNITY): Payer: Self-pay | Admitting: *Deleted

## 2014-06-25 DIAGNOSIS — I6529 Occlusion and stenosis of unspecified carotid artery: Secondary | ICD-10-CM

## 2014-06-28 ENCOUNTER — Other Ambulatory Visit: Payer: Self-pay | Admitting: Specialist

## 2014-06-28 DIAGNOSIS — M542 Cervicalgia: Secondary | ICD-10-CM

## 2014-07-01 ENCOUNTER — Telehealth: Payer: Self-pay | Admitting: Cardiovascular Disease

## 2014-07-01 NOTE — Telephone Encounter (Signed)
Received request from Nurse fax box, documents faxed for surgical clearance. To: [employer] Fax number: [phone] Attention: 9.28.15/km

## 2014-07-04 HISTORY — PX: SHOULDER OPEN ROTATOR CUFF REPAIR: SHX2407

## 2014-07-06 ENCOUNTER — Other Ambulatory Visit: Payer: Medicare Other

## 2014-07-07 ENCOUNTER — Ambulatory Visit
Admission: RE | Admit: 2014-07-07 | Discharge: 2014-07-07 | Disposition: A | Discharge status: Home / Self Care | Payer: 59 | Source: Ambulatory Visit | Attending: Specialist | Admitting: Specialist

## 2014-07-07 DIAGNOSIS — M542 Cervicalgia: Secondary | ICD-10-CM

## 2014-07-16 ENCOUNTER — Ambulatory Visit (HOSPITAL_COMMUNITY): Payer: Medicare Other | Attending: Family Medicine | Admitting: *Deleted

## 2014-07-16 DIAGNOSIS — I6523 Occlusion and stenosis of bilateral carotid arteries: Secondary | ICD-10-CM

## 2014-07-16 DIAGNOSIS — I2510 Atherosclerotic heart disease of native coronary artery without angina pectoris: Secondary | ICD-10-CM | POA: Diagnosis not present

## 2014-07-16 DIAGNOSIS — E785 Hyperlipidemia, unspecified: Secondary | ICD-10-CM | POA: Insufficient documentation

## 2014-07-16 DIAGNOSIS — I10 Essential (primary) hypertension: Secondary | ICD-10-CM | POA: Diagnosis not present

## 2014-07-16 DIAGNOSIS — Z8673 Personal history of transient ischemic attack (TIA), and cerebral infarction without residual deficits: Secondary | ICD-10-CM | POA: Diagnosis not present

## 2014-07-16 DIAGNOSIS — I25810 Atherosclerosis of coronary artery bypass graft(s) without angina pectoris: Secondary | ICD-10-CM

## 2014-07-16 NOTE — Progress Notes (Signed)
Carotid Duplex Performed

## 2014-07-23 ENCOUNTER — Telehealth: Payer: Self-pay | Admitting: Cardiovascular Disease

## 2014-07-23 NOTE — Telephone Encounter (Signed)
Received request from Nurse fax box, documents faxed for surgical clearance. To: [employer] Fax number: [phone] Attention: 10.20.15/km

## 2014-07-27 ENCOUNTER — Encounter (HOSPITAL_COMMUNITY): Payer: Self-pay | Admitting: Emergency Medicine

## 2014-07-27 ENCOUNTER — Emergency Department (HOSPITAL_COMMUNITY)
Admission: EM | Admit: 2014-07-27 | Discharge: 2014-07-27 | Disposition: A | Discharge status: Home / Self Care | Payer: Medicare Other | Attending: Emergency Medicine | Admitting: Emergency Medicine

## 2014-07-27 DIAGNOSIS — M1990 Unspecified osteoarthritis, unspecified site: Secondary | ICD-10-CM | POA: Diagnosis not present

## 2014-07-27 DIAGNOSIS — I480 Paroxysmal atrial fibrillation: Secondary | ICD-10-CM | POA: Insufficient documentation

## 2014-07-27 DIAGNOSIS — Z8709 Personal history of other diseases of the respiratory system: Secondary | ICD-10-CM | POA: Diagnosis not present

## 2014-07-27 DIAGNOSIS — Z7982 Long term (current) use of aspirin: Secondary | ICD-10-CM | POA: Diagnosis not present

## 2014-07-27 DIAGNOSIS — E785 Hyperlipidemia, unspecified: Secondary | ICD-10-CM | POA: Insufficient documentation

## 2014-07-27 DIAGNOSIS — I10 Essential (primary) hypertension: Secondary | ICD-10-CM | POA: Diagnosis not present

## 2014-07-27 DIAGNOSIS — Z8673 Personal history of transient ischemic attack (TIA), and cerebral infarction without residual deficits: Secondary | ICD-10-CM | POA: Insufficient documentation

## 2014-07-27 DIAGNOSIS — Z87891 Personal history of nicotine dependence: Secondary | ICD-10-CM | POA: Diagnosis not present

## 2014-07-27 DIAGNOSIS — I2510 Atherosclerotic heart disease of native coronary artery without angina pectoris: Secondary | ICD-10-CM | POA: Diagnosis not present

## 2014-07-27 DIAGNOSIS — Z79899 Other long term (current) drug therapy: Secondary | ICD-10-CM | POA: Diagnosis not present

## 2014-07-27 DIAGNOSIS — K219 Gastro-esophageal reflux disease without esophagitis: Secondary | ICD-10-CM | POA: Diagnosis not present

## 2014-07-27 DIAGNOSIS — E669 Obesity, unspecified: Secondary | ICD-10-CM | POA: Insufficient documentation

## 2014-07-27 DIAGNOSIS — G4730 Sleep apnea, unspecified: Secondary | ICD-10-CM | POA: Insufficient documentation

## 2014-07-27 DIAGNOSIS — Z880 Allergy status to penicillin: Secondary | ICD-10-CM | POA: Diagnosis not present

## 2014-07-27 DIAGNOSIS — M25532 Pain in left wrist: Secondary | ICD-10-CM | POA: Diagnosis present

## 2014-07-27 MED ORDER — PREDNISONE 20 MG PO TABS
60.0000 mg | ORAL_TABLET | Freq: Once | ORAL | Status: AC
  Administered 2014-07-27: 60 mg via ORAL
  Filled 2014-07-27: qty 3

## 2014-07-27 MED ORDER — HYDROMORPHONE HCL 1 MG/ML IJ SOLN
1.0000 mg | Freq: Once | INTRAMUSCULAR | Status: AC
  Administered 2014-07-27: 1 mg via INTRAMUSCULAR
  Filled 2014-07-27: qty 1

## 2014-07-27 MED ORDER — ONDANSETRON 4 MG PO TBDP
4.0000 mg | ORAL_TABLET | Freq: Once | ORAL | Status: AC
  Administered 2014-07-27: 4 mg via ORAL
  Filled 2014-07-27: qty 1

## 2014-07-27 MED ORDER — PREDNISONE 10 MG PO TABS: 20.0000 mg | ORAL_TABLET | Freq: Every day | ORAL | Status: DC

## 2014-07-27 NOTE — ED Notes (Signed)
He states he underwent left shoulder surgery by Dr. Veverly Fells this Mon.  He is here today with c/o sudden, sharp left ant./medial wrist pain.  He is in no distress and is still in his sling/immobilizer.  Cap. Refill in all fingers is immediate, and he denies any paresthesias of any fingers.

## 2014-07-27 NOTE — ED Provider Notes (Signed)
CSN: [id number]     Arrival date & time 07/27/14  1240 History   First MD Initiated Contact with Patient 07/27/14 1330     Chief Complaint  Patient presents with  . Wrist Pain     (Consider location/radiation/quality/duration/timing/severity/associated sxs/prior Treatment) HPI Comments: Patient presents with left wrist pain. He had surgery 6 days ago on his left shoulder by Dr. Veverly Fells. He had repair of his [facility] joint as well as rotator cuff repair. He states his shoulder is actually feeling a lot better and the swelling has gone down but he woke up during the night with intense pain in his left wrist. He describes as a burning pain in the center of the wrist radiating to the ulnar side. He states it's worse with any movement of the wrist. He denies any numbness of the hand. He denies any swelling to the hand. He was previously having swelling to his shoulder and arm for the swelling has gone down after the surgery. He denies any fevers. He did not take anything today for the discomfort. He does have a history of carpal tunnel syndrome.  Patient is a 68 y.o. male presenting with wrist pain.  Wrist Pain Pertinent negatives include no headaches.    Past Medical History  Diagnosis Date  . Essential hypertension, benign   . Dyslipidemia   . GERD (gastroesophageal reflux disease)   . Recurrent upper respiratory infection (URI)   . Sleep apnea     CPAP  . Arthritis   . Coronary atherosclerosis of native coronary artery     a. BMS to [facility] 2004 and 2007, otherwise mild nonobstructive disease. EF normal.  . CVA (cerebral infarction)     a. 07/2012 - AF discovered after stroke.  . Paroxysmal atrial fibrillation     a. Discovered after stroke.  . Obesity    Past Surgical History  Procedure Laterality Date  . Cervical fusion  07/2001  . Lumbar fusion  10/2003    L5-S1  . Knee arthroscopy  10/2004    LEFT KNEE  . Replacement total knee      left  . Tonsillectomy    . Tee without  cardioversion  07/07/2012    Procedure: TRANSESOPHAGEAL ECHOCARDIOGRAM (TEE);  Surgeon: Fay Records, MD;  Location: [place];  Service: Cardiovascular;  Laterality: N/A;  . Shoulder surgery Left    Family History  Problem Relation Age of Onset  . Hypertension Mother   . Heart disease Father   . Heart attack Father    History  Substance Use Topics  . Smoking status: Former Smoker -- 1.00 packs/day for 20 years    Types: Cigarettes    Quit date: 05/05/2003  . Smokeless tobacco: Never Used  . Alcohol Use: No    Review of Systems  Constitutional: Negative for fever.  Gastrointestinal: Negative for nausea and vomiting.  Musculoskeletal: Positive for arthralgias. Negative for back pain, joint swelling and neck pain.  Skin: Negative for wound.  Neurological: Negative for weakness, numbness and headaches.      Allergies  Codeine; Monopril; and Ampicillin  Home Medications   Prior to Admission medications   Medication Sig Start Date End Date Taking? Authorizing Provider  acetaminophen (TYLENOL) 325 MG tablet Take 325-650 mg by mouth every 6 (six) hours as needed (pain).   Yes Historical Provider, MD  aspirin EC 81 MG tablet Take 81 mg by mouth daily.   Yes Historical Provider, MD  atorvastatin (LIPITOR) 40 MG tablet Take 40 mg  by mouth daily.   Yes Historical Provider, MD  diclofenac sodium (VOLTAREN) 1 % GEL Apply 4 g topically 4 (four) times daily as needed (for pain / inflammation). Apply to left hand   Yes Historical Provider, MD  fenofibrate 160 MG tablet Take 160 mg by mouth daily.   Yes Historical Provider, MD  fluticasone (FLONASE) 50 MCG/ACT nasal spray Place 2 sprays into the nose daily as needed for allergies.    Yes Historical Provider, MD  HYDROcodone-acetaminophen (NORCO/VICODIN) 5-325 MG per tablet Take 1 tablet by mouth every 6 (six) hours as needed for moderate pain.   Yes Historical Provider, MD  lisinopril (PRINIVIL,ZESTRIL) 20 MG tablet Take 20 mg by mouth  daily.   Yes Historical Provider, MD  metoprolol succinate (TOPROL-XL) 100 MG 24 hr tablet Take 1 tablet (100 mg total) by mouth daily. Take with or immediately following a meal. 06/14/14  Yes Thayer Headings, MD  Multiple Vitamins-Minerals (MULTIVITAMINS THER. W/MINERALS) TABS Take 1 tablet by mouth daily.     Yes Historical Provider, MD  omeprazole (PRILOSEC) 10 MG capsule Take 10 mg by mouth daily.   Yes Historical Provider, MD  ondansetron (ZOFRAN) 4 MG tablet Take 4 mg by mouth daily.   Yes Historical Provider, MD  rivaroxaban (XARELTO) 20 MG TABS tablet Take 20 mg by mouth daily.   Yes Historical Provider, MD  Tamsulosin HCl (FLOMAX) 0.4 MG CAPS Take 0.4 mg by mouth daily.     Yes Historical Provider, MD  nitroGLYCERIN (NITROSTAT) 0.4 MG SL tablet Place 0.4 mg under the tongue every 5 (five) minutes as needed for chest pain.    Historical Provider, MD  predniSONE (DELTASONE) 10 MG tablet Take 2 tablets (20 mg total) by mouth daily. 07/27/14   Malvin Johns, MD   BP 108/77  Pulse 74  Temp(Src) 98.7 F (37.1 C) (Oral)  Resp 16  SpO2 97% Physical Exam  Constitutional: He is oriented to person, place, and time. He appears well-developed and well-nourished.  HENT:  Head: Normocephalic and atraumatic.  Neck: Normal range of motion. Neck supple.  Cardiovascular: Normal rate.   Pulmonary/Chest: Effort normal.  Musculoskeletal: He exhibits tenderness. He exhibits no edema.  Patient has tenderness along the volar surface of the wrist at the median nerve area and also to the ulnar side of the wrist. He has no swelling or discoloration of the wrist. He has normal sensation in all nerve areas of the wrist. He has normal motor function in the wrist. Radial pulse intact.  Neurological: He is alert and oriented to person, place, and time.  Skin: Skin is warm and dry.  Psychiatric: He has a normal mood and affect.    ED Course  Procedures (including critical care time) Labs Review Labs Reviewed  - No data to display  Imaging Review No results found.   EKG Interpretation None      MDM   Final diagnoses:  Wrist pain, acute, left    Patient is feeling much better after her shot of Dilaudid. I will put him on a prednisone burst. He will follow-up with Dr. Fredna Dow, his hand surgeon if his symptoms are not improved on Monday. He has no suggestions of infection or septic joint.    Malvin Johns, MD 07/27/14 [phone]

## 2014-07-27 NOTE — Discharge Instructions (Signed)
Wrist Pain Wrist injuries are frequent in adults and children. A sprain is an injury to the ligaments that hold your bones together. A strain is an injury to muscle or muscle cord-like structures (tendons) from stretching or pulling. Generally, when wrists are moderately tender to touch following a fall or injury, a break in the bone (fracture) may be present. Most wrist sprains or strains are better in 3 to 5 days, but complete healing may take several weeks. HOME CARE INSTRUCTIONS   Put ice on the injured area.  Put ice in a plastic bag.  Place a towel between your skin and the bag.  Leave the ice on for 15-20 minutes, 3-4 times a day, for the first 2 days, or as directed by your health care provider.  Keep your arm raised above the level of your heart whenever possible to reduce swelling and pain.  Rest the injured area for at least 48 hours or as directed by your health care provider.  If a splint or elastic bandage has been applied, use it for as long as directed by your health care provider or until seen by a health care provider for a follow-up exam.  Only take over-the-counter or prescription medicines for pain, discomfort, or fever as directed by your health care provider.  Keep all follow-up appointments. You may need to follow up with a specialist or have follow-up X-rays. Improvement in pain level is not a guarantee that you did not fracture a bone in your wrist. The only way to determine whether or not you have a broken bone is by X-ray. SEEK IMMEDIATE MEDICAL CARE IF:   Your fingers are swollen, very red, white, or cold and blue.  Your fingers are numb or tingling.  You have increasing pain.  You have difficulty moving your fingers. MAKE SURE YOU:   Understand these instructions.  Will watch your condition.  Will get help right away if you are not doing well or get worse. Document Released: 06/30/2005 Document Revised: 09/25/2013 Document Reviewed:  11/11/2010 [employer] Patient Information 2015 [employer]. This information is not intended to replace advice given to you by your health care provider. Make sure you discuss any questions you have with your health care provider.  

## 2014-07-29 ENCOUNTER — Telehealth: Payer: Self-pay | Admitting: Cardiovascular Disease

## 2014-07-29 NOTE — Telephone Encounter (Signed)
Received request from Nurse fax box, documents faxed for surgical clearance. To: [employer] number: [phone] Attention: 10.26.15/km

## 2014-08-20 ENCOUNTER — Ambulatory Visit
Admission: RE | Admit: 2014-08-20 | Discharge: 2014-08-20 | Disposition: A | Discharge status: Home / Self Care | Payer: Medicare Other | Source: Ambulatory Visit | Attending: Family Medicine | Admitting: Family Medicine

## 2014-08-20 ENCOUNTER — Other Ambulatory Visit: Payer: Self-pay | Admitting: Family Medicine

## 2014-08-20 DIAGNOSIS — M25532 Pain in left wrist: Secondary | ICD-10-CM

## 2014-08-21 ENCOUNTER — Encounter: Payer: Self-pay | Admitting: Neurology

## 2014-09-02 ENCOUNTER — Other Ambulatory Visit: Payer: Self-pay | Admitting: Cardiovascular Disease

## 2014-09-03 ENCOUNTER — Other Ambulatory Visit (HOSPITAL_COMMUNITY): Payer: Self-pay | Admitting: Orthopedic Surgery

## 2014-09-03 ENCOUNTER — Ambulatory Visit (HOSPITAL_COMMUNITY)
Admission: RE | Admit: 2014-09-03 | Discharge: 2014-09-03 | Disposition: A | Discharge status: Home / Self Care | Payer: Medicare Other | Source: Ambulatory Visit | Attending: Cardiovascular Disease | Admitting: Cardiovascular Disease

## 2014-09-03 DIAGNOSIS — M75102 Unspecified rotator cuff tear or rupture of left shoulder, not specified as traumatic: Secondary | ICD-10-CM | POA: Insufficient documentation

## 2014-09-03 DIAGNOSIS — M79622 Pain in left upper arm: Secondary | ICD-10-CM

## 2014-09-03 NOTE — Progress Notes (Signed)
Left Upper Ext. Venous Duplex Completed. Negative for DVT or SVT. Oda Cogan, BS, RDMS, RVT

## 2014-09-12 ENCOUNTER — Other Ambulatory Visit: Payer: Self-pay | Admitting: Cardiovascular Disease

## 2014-09-21 ENCOUNTER — Other Ambulatory Visit: Payer: Self-pay | Admitting: Cardiovascular Disease

## 2014-10-12 ENCOUNTER — Other Ambulatory Visit: Payer: Self-pay | Admitting: Cardiovascular Disease

## 2014-10-23 ENCOUNTER — Other Ambulatory Visit: Payer: Self-pay | Admitting: Cardiovascular Disease

## 2014-11-16 ENCOUNTER — Other Ambulatory Visit: Payer: Self-pay | Admitting: Cardiovascular Disease

## 2014-11-18 ENCOUNTER — Other Ambulatory Visit: Payer: Self-pay | Admitting: Cardiovascular Disease

## 2014-11-21 ENCOUNTER — Other Ambulatory Visit: Payer: Self-pay | Admitting: Cardiovascular Disease

## 2014-11-26 ENCOUNTER — Ambulatory Visit: Payer: Medicare Other | Admitting: Pulmonary Disease

## 2014-12-03 ENCOUNTER — Observation Stay (HOSPITAL_COMMUNITY)
Admission: EM | Admit: 2014-12-03 | Discharge: 2014-12-04 | Disposition: A | Discharge status: Home / Self Care | Payer: Medicare Other | Attending: Internal Medicine | Admitting: Internal Medicine

## 2014-12-03 ENCOUNTER — Encounter (HOSPITAL_COMMUNITY): Payer: Self-pay | Admitting: General Practice

## 2014-12-03 ENCOUNTER — Emergency Department (HOSPITAL_COMMUNITY): Payer: Medicare Other

## 2014-12-03 DIAGNOSIS — K219 Gastro-esophageal reflux disease without esophagitis: Secondary | ICD-10-CM | POA: Diagnosis not present

## 2014-12-03 DIAGNOSIS — G4733 Obstructive sleep apnea (adult) (pediatric): Secondary | ICD-10-CM | POA: Diagnosis present

## 2014-12-03 DIAGNOSIS — E669 Obesity, unspecified: Secondary | ICD-10-CM | POA: Insufficient documentation

## 2014-12-03 DIAGNOSIS — E785 Hyperlipidemia, unspecified: Secondary | ICD-10-CM | POA: Diagnosis present

## 2014-12-03 DIAGNOSIS — Z79899 Other long term (current) drug therapy: Secondary | ICD-10-CM | POA: Insufficient documentation

## 2014-12-03 DIAGNOSIS — R55 Syncope and collapse: Secondary | ICD-10-CM | POA: Insufficient documentation

## 2014-12-03 DIAGNOSIS — Z7901 Long term (current) use of anticoagulants: Secondary | ICD-10-CM | POA: Insufficient documentation

## 2014-12-03 DIAGNOSIS — I10 Essential (primary) hypertension: Secondary | ICD-10-CM | POA: Insufficient documentation

## 2014-12-03 DIAGNOSIS — I480 Paroxysmal atrial fibrillation: Secondary | ICD-10-CM | POA: Diagnosis not present

## 2014-12-03 DIAGNOSIS — I4892 Unspecified atrial flutter: Secondary | ICD-10-CM | POA: Insufficient documentation

## 2014-12-03 DIAGNOSIS — Z8673 Personal history of transient ischemic attack (TIA), and cerebral infarction without residual deficits: Secondary | ICD-10-CM | POA: Diagnosis not present

## 2014-12-03 DIAGNOSIS — I2510 Atherosclerotic heart disease of native coronary artery without angina pectoris: Secondary | ICD-10-CM | POA: Insufficient documentation

## 2014-12-03 DIAGNOSIS — I491 Atrial premature depolarization: Secondary | ICD-10-CM | POA: Diagnosis present

## 2014-12-03 DIAGNOSIS — I639 Cerebral infarction, unspecified: Secondary | ICD-10-CM | POA: Diagnosis present

## 2014-12-03 DIAGNOSIS — R002 Palpitations: Secondary | ICD-10-CM | POA: Diagnosis not present

## 2014-12-03 DIAGNOSIS — Z87891 Personal history of nicotine dependence: Secondary | ICD-10-CM | POA: Diagnosis not present

## 2014-12-03 DIAGNOSIS — Z7982 Long term (current) use of aspirin: Secondary | ICD-10-CM | POA: Insufficient documentation

## 2014-12-03 DIAGNOSIS — Z9989 Dependence on other enabling machines and devices: Secondary | ICD-10-CM | POA: Diagnosis present

## 2014-12-03 DIAGNOSIS — I4891 Unspecified atrial fibrillation: Secondary | ICD-10-CM

## 2014-12-03 DIAGNOSIS — I471 Supraventricular tachycardia: Secondary | ICD-10-CM | POA: Diagnosis present

## 2014-12-03 HISTORY — DX: Obstructive sleep apnea (adult) (pediatric): Z99.89

## 2014-12-03 HISTORY — DX: Obstructive sleep apnea (adult) (pediatric): G47.33

## 2014-12-03 HISTORY — DX: Cerebral infarction, unspecified: I63.9

## 2014-12-03 HISTORY — DX: Unspecified atrial fibrillation: I48.91

## 2014-12-03 LAB — BASIC METABOLIC PANEL
Anion gap: 4 — ABNORMAL LOW (ref 5–15)
BUN: 28 mg/dL — ABNORMAL HIGH (ref 6–23)
CO2: 29 mmol/L (ref 19–32)
Calcium: 9.1 mg/dL (ref 8.4–10.5)
Chloride: 104 mmol/L (ref 96–112)
Creatinine, Ser: 1.35 mg/dL (ref 0.50–1.35)
GFR calc Af Amer: 61 mL/min — ABNORMAL LOW (ref >90)
GFR calc non Af Amer: 53 mL/min — ABNORMAL LOW (ref >90)
Glucose, Bld: 117 mg/dL — ABNORMAL HIGH (ref 70–99)
Potassium: 4.6 mmol/L (ref 3.5–5.1)
Sodium: 137 mmol/L (ref 135–145)

## 2014-12-03 LAB — CBC WITH DIFFERENTIAL/PLATELET
Basophils Absolute: 0.1 10*3/uL (ref 0.0–0.1)
Basophils Relative: 1 % (ref 0–1)
Eosinophils Absolute: 0.6 10*3/uL (ref 0.0–0.7)
Eosinophils Relative: 9 % — ABNORMAL HIGH (ref 0–5)
HCT: 40.1 % (ref 39.0–52.0)
Hemoglobin: 13.1 g/dL (ref 13.0–17.0)
Lymphocytes Relative: 29 % (ref 12–46)
Lymphs Abs: 2 10*3/uL (ref 0.7–4.0)
MCH: 28.1 pg (ref 26.0–34.0)
MCHC: 32.7 g/dL (ref 30.0–36.0)
MCV: 86.1 fL (ref 78.0–100.0)
Monocytes Absolute: 0.9 10*3/uL (ref 0.1–1.0)
Monocytes Relative: 13 % — ABNORMAL HIGH (ref 3–12)
Neutro Abs: 3.3 10*3/uL (ref 1.7–7.7)
Neutrophils Relative %: 48 % (ref 43–77)
Platelets: 250 10*3/uL (ref 150–400)
RBC: 4.66 MIL/uL (ref 4.22–5.81)
RDW: 16.3 % — ABNORMAL HIGH (ref 11.5–15.5)
WBC: 6.9 10*3/uL (ref 4.0–10.5)

## 2014-12-03 LAB — MAGNESIUM: Magnesium: 2.1 mg/dL (ref 1.5–2.5)

## 2014-12-03 LAB — I-STAT TROPONIN, ED: Troponin i, poc: 0.01 ng/mL (ref 0.00–0.08)

## 2014-12-03 LAB — CBG MONITORING, ED: Glucose-Capillary: 115 mg/dL — ABNORMAL HIGH (ref 70–99)

## 2014-12-03 LAB — PHOSPHORUS: Phosphorus: 3 mg/dL (ref 2.3–4.6)

## 2014-12-03 MED ORDER — LISINOPRIL 20 MG PO TABS
20.0000 mg | ORAL_TABLET | Freq: Every day | ORAL | Status: DC
  Filled 2014-12-03: qty 1

## 2014-12-03 MED ORDER — ATORVASTATIN CALCIUM 40 MG PO TABS
40.0000 mg | ORAL_TABLET | Freq: Every day | ORAL | Status: DC
  Filled 2014-12-03: qty 1

## 2014-12-03 MED ORDER — DILTIAZEM HCL 25 MG/5ML IV SOLN
10.0000 mg | Freq: Once | INTRAVENOUS | Status: AC
  Administered 2014-12-03: 10 mg via INTRAVENOUS
  Filled 2014-12-03: qty 5

## 2014-12-03 MED ORDER — SODIUM CHLORIDE 0.9 % IV BOLUS (SEPSIS)
500.0000 mL | Freq: Once | INTRAVENOUS | Status: AC
  Administered 2014-12-03: 500 mL via INTRAVENOUS

## 2014-12-03 MED ORDER — TAMSULOSIN HCL 0.4 MG PO CAPS
0.4000 mg | ORAL_CAPSULE | Freq: Every day | ORAL | Status: DC
  Administered 2014-12-04: 0.4 mg via ORAL
  Filled 2014-12-03: qty 1

## 2014-12-03 MED ORDER — DEXTROSE 5 % IV SOLN
5.0000 mg/h | Freq: Once | INTRAVENOUS | Status: AC
  Administered 2014-12-03: 5 mg/h via INTRAVENOUS
  Filled 2014-12-03: qty 100

## 2014-12-03 MED ORDER — FENOFIBRATE 160 MG PO TABS
160.0000 mg | ORAL_TABLET | Freq: Every day | ORAL | Status: DC
  Administered 2014-12-04: 160 mg via ORAL
  Filled 2014-12-03: qty 1

## 2014-12-03 MED ORDER — RIVAROXABAN 20 MG PO TABS
20.0000 mg | ORAL_TABLET | Freq: Every day | ORAL | Status: DC
  Administered 2014-12-04: 20 mg via ORAL
  Filled 2014-12-03 (×3): qty 1

## 2014-12-03 MED ORDER — ASPIRIN EC 81 MG PO TBEC
81.0000 mg | DELAYED_RELEASE_TABLET | Freq: Every day | ORAL | Status: DC
  Administered 2014-12-04: 81 mg via ORAL
  Filled 2014-12-03: qty 1

## 2014-12-03 NOTE — ED Provider Notes (Signed)
CSN: [id number]     Arrival date & time 12/03/14  1752 History   First MD Initiated Contact with Patient 12/03/14 1807     Chief Complaint  Patient presents with  . Near Syncope     (Consider location/radiation/quality/duration/timing/severity/associated sxs/prior Treatment) HPI Comments: 69 yo F hx HTN, HLD, paroxysmal atrial fibrillation, CAD s/p PCI (2004, 2007), CVA, presents with CC of lightheadedness, tachycardia.  Pt states he was sitting down around 330-430 and had sudden onset face flushing, diaphoretic, lightheadedness, and central, nonradiating, chest tightness, all lasting for approximately 20 seconds.  Pt states he has had an additional 2 episodes since the initial event.  Pt states he took BP at home, which came up with error on his machine, and his pulse which was around 112.  EMS called and brought pt to ED.  Pt states he feels well currently.  Denies fever, chills, SOB, cough, abdominal pain, nausea, vomiting, diarrhea, rash, myalgias, or any other episodes.  He states he has had prior occurrences, and thought he may be in atrial fibrillation.  No other concerns.    The history is provided by the patient. No language interpreter was used.    Past Medical History  Diagnosis Date  . Essential hypertension, benign   . Dyslipidemia   . GERD (gastroesophageal reflux disease)   . Recurrent upper respiratory infection (URI)   . Sleep apnea     CPAP  . Arthritis   . Coronary atherosclerosis of native coronary artery     a. BMS to [facility] 2004 and 2007, otherwise mild nonobstructive disease. EF normal.  . CVA (cerebral infarction)     a. 07/2012 - AF discovered after stroke.  . Paroxysmal atrial fibrillation     a. Discovered after stroke.  . Obesity    Past Surgical History  Procedure Laterality Date  . Cervical fusion  07/2001  . Lumbar fusion  10/2003    L5-S1  . Knee arthroscopy  10/2004    LEFT KNEE  . Replacement total knee      left  . Tonsillectomy    . Tee without  cardioversion  07/07/2012    Procedure: TRANSESOPHAGEAL ECHOCARDIOGRAM (TEE);  Surgeon: Fay Records, MD;  Location: [facility];  Service: Cardiovascular;  Laterality: N/A;  . Shoulder surgery Left    Family History  Problem Relation Age of Onset  . Hypertension Mother   . Heart disease Father   . Heart attack Father    History  Substance Use Topics  . Smoking status: Former Smoker -- 1.00 packs/day for 20 years    Types: Cigarettes    Quit date: 05/05/2003  . Smokeless tobacco: Never Used  . Alcohol Use: No    Review of Systems  Constitutional: Positive for diaphoresis. Negative for fever and chills.  Respiratory: Negative for cough and shortness of breath.   Cardiovascular: Positive for palpitations. Negative for leg swelling.       Chest tightness   Gastrointestinal: Negative for nausea, vomiting, abdominal pain and diarrhea.  Genitourinary: Negative for dysuria.  Musculoskeletal: Negative for myalgias.  Skin: Negative for rash.  Neurological: Positive for light-headedness. Negative for dizziness, weakness, numbness and headaches.  Hematological: Negative for adenopathy. Does not bruise/bleed easily.  All other systems reviewed and are negative.     Allergies  Codeine; Monopril; and Ampicillin  Home Medications   Prior to Admission medications   Medication Sig Start Date End Date Taking? Authorizing Provider  acetaminophen (TYLENOL) 325 MG tablet Take 325-650 mg  by mouth every 6 (six) hours as needed (pain).    Historical Provider, MD  atorvastatin (LIPITOR) 40 MG tablet Take 40 mg by mouth daily.    Historical Provider, MD  diclofenac sodium (VOLTAREN) 1 % GEL Apply 4 g topically 4 (four) times daily as needed (for pain / inflammation). Apply to left hand    Historical Provider, MD  fenofibrate 160 MG tablet Take 160 mg by mouth daily.    Historical Provider, MD  fenofibrate 160 MG tablet TAKE 1 TABLET BY MOUTH EVERY DAY 11/22/14   Thayer Headings, MD  fluticasone  [facility]) 50 MCG/ACT nasal spray Place 2 sprays into the nose daily as needed for allergies.     Historical Provider, MD  HYDROcodone-acetaminophen (NORCO/VICODIN) 5-325 MG per tablet Take 1 tablet by mouth every 6 (six) hours as needed for moderate pain.    Historical Provider, MD  lisinopril (PRINIVIL,ZESTRIL) 20 MG tablet TAKE 1 TABLET BY MOUTH EVERY DAY 11/19/14   Thayer Headings, MD  metoprolol succinate (TOPROL-XL) 100 MG 24 hr tablet TAKE 1 TABLET (100 MG TOTAL) BY MOUTH DAILY. TAKE WITH OR IMMEDIATELY FOLLOWING A MEAL. 09/13/14   Thayer Headings, MD  Multiple Vitamins-Minerals (MULTIVITAMINS THER. W/MINERALS) TABS Take 1 tablet by mouth daily.      Historical Provider, MD  nitroGLYCERIN (NITROSTAT) 0.4 MG SL tablet Place 0.4 mg under the tongue every 5 (five) minutes as needed for chest pain.    Historical Provider, MD  omeprazole (PRILOSEC) 10 MG capsule TAKE ONE CAPSULE BY MOUTH EVERY DAY 11/19/14   Thayer Headings, MD  ondansetron (ZOFRAN) 4 MG tablet Take 4 mg by mouth daily.    Historical Provider, MD  predniSONE (DELTASONE) 10 MG tablet Take 2 tablets (20 mg total) by mouth daily. 07/27/14   Malvin Johns, MD  Tamsulosin HCl (FLOMAX) 0.4 MG CAPS Take 0.4 mg by mouth daily.      Historical Provider, MD  XARELTO 20 MG TABS tablet TAKE 1 TABLET BY MOUTH EVERY DAY 11/18/14   Lendon Colonel, NP   BP 130/71 mmHg  Pulse 131  Temp(Src) 98.3 F (36.8 C)  Resp 24  Ht 6' 1.5" (1.867 m)  Wt 265 lb 3.4 oz (120.3 kg)  BMI 34.51 kg/m2  SpO2 97% Physical Exam  Constitutional: He is oriented to person, place, and time. He appears well-developed and well-nourished.  HENT:  Head: Normocephalic and atraumatic.  Right Ear: External ear normal.  Left Ear: External ear normal.  Mouth/Throat: Oropharynx is clear and moist.  Eyes: Conjunctivae and EOM are normal. Pupils are equal, round, and reactive to light.  Neck: Normal range of motion. Neck supple.  Cardiovascular: Normal rate, regular  rhythm, normal heart sounds and intact distal pulses.   Pulmonary/Chest: Effort normal and breath sounds normal. No respiratory distress. He has no wheezes. He has no rales. He exhibits no tenderness.  Abdominal: Soft. Bowel sounds are normal. He exhibits no distension and no mass. There is no tenderness. There is no rebound and no guarding.  Musculoskeletal: Normal range of motion.  Neurological: He is alert and oriented to person, place, and time.  Skin: Skin is warm and dry.  Nursing note and vitals reviewed.   ED Course  Procedures (including critical care time) Labs Review Labs Reviewed  CBC WITH DIFFERENTIAL/PLATELET - Abnormal; Notable for the following:    RDW 16.3 (*)    Monocytes Relative 13 (*)    Eosinophils Relative 9 (*)    All  other components within normal limits  BASIC METABOLIC PANEL - Abnormal; Notable for the following:    Glucose, Bld 117 (*)    BUN 28 (*)    GFR calc non Af Amer 53 (*)    GFR calc Af Amer 61 (*)    Anion gap 4 (*)    All other components within normal limits  CBG MONITORING, ED - Abnormal; Notable for the following:    Glucose-Capillary 115 (*)    All other components within normal limits  MAGNESIUM  PHOSPHORUS  I-STAT TROPOININ, ED    Imaging Review Dg Chest Portable 1 View  12/03/2014   CLINICAL DATA:  Near syncope and atrial fibrillation  EXAM: PORTABLE CHEST - 1 VIEW  COMPARISON:  April 05, 2014.  FINDINGS: There is no edema or consolidation. Heart is slightly enlarged with pulmonary vascularity within normal limits. No adenopathy. There is postoperative change in the lower cervical spine.  IMPRESSION: Heart mildly enlarged.  No edema or consolidation.   Electronically Signed   By: Lowella Grip III M.D.   On: 12/03/2014 19:39     EKG Interpretation   Date/Time:  Tuesday December 03 2014 18:00:41 EST Ventricular Rate:  142 PR Interval:    QRS Duration: 95 QT Interval:  281 QTC Calculation: 432 R Axis:   -45 Text Interpretation:   Atrial fibrillation with rapid ventricular response  LAD, consider left anterior fascicular block Abnormal R-wave progression,  late transition Borderline repolarization abnormality Confirmed by Ashok Cordia   MD, Lennette Bihari ([id number]) on 12/03/2014 6:09:26 PM      MDM   Final diagnoses:  Atrial fibrillation with RVR   69 yo F hx HTN, HLD, paroxysmal atrial fibrillation, CAD s/p PCI (2004, 2007), CVA, presents with CC of lightheadedness, tachycardia.   Physical exam as above.  Pt with HR 140's, atrial fibrillation RVR.  Currently asymptomatic, and normotensive.  Pt given 10 mg diltiazem bolus, and started on cardizem drip.    Labs demonstrate Troponin WNL, CBC WNL, BMP WNL, Mag and phos are WNL.  CXR unremarkable.    Pt maxed out on cardizem, and given another 10 mg diltiazem bolus.  Rate still not controlled.   Cardiology consulted for admission.  Plan for admission, and probable cardioversion in AM. Pt understands and agrees with plan.   Sinda Du  Discussed pt with attending Dr. Ashok Cordia.      Sinda Du, MD 12/04/14 0025  Mirna Mires, MD 12/05/14 [phone]

## 2014-12-03 NOTE — Progress Notes (Signed)
ANTICOAGULATION CONSULT NOTE - Initial Consult  Pharmacy Consult for Xarelto  Indication: atrial fibrillation  Allergies  Allergen Reactions  . Codeine Nausea Only    Heavy amounts cause nausea  . Monopril [Fosinopril Sodium] Other (See Comments)    Muscle aches & pain  . Ampicillin Rash   Patient Measurements: Height: 6' 1.5" (186.7 cm) Weight: 265 lb 3.4 oz (120.3 kg) IBW/kg (Calculated) : 81.05  Vital Signs: Temp: 98.3 F (36.8 C) (03/01 1801) BP: 100/70 mmHg (03/01 2230) Pulse Rate: 141 (03/01 2230)  Labs:  Recent Labs  12/03/14 1828  HGB 13.1  HCT 40.1  PLT 250  CREATININE 1.35    Estimated Creatinine Clearance: 72.7 mL/min (by C-G formula based on Cr of 1.35).   Medical History: Past Medical History  Diagnosis Date  . Essential hypertension, benign   . Dyslipidemia   . GERD (gastroesophageal reflux disease)   . Recurrent upper respiratory infection (URI)   . Sleep apnea     CPAP  . Arthritis   . Coronary atherosclerosis of native coronary artery     a. BMS to [facility] 2004 and 2007, otherwise mild nonobstructive disease. EF normal.  . CVA (cerebral infarction)     a. 07/2012 - AF discovered after stroke.  . Paroxysmal atrial fibrillation     a. Discovered after stroke.  . Obesity     Assessment: Continuing PTA Xarelto (last dose 2/29). CBC good. Renal function ok. Possible DCCV in AM per CARDS.   Goal of Therapy:  Monitor platelets by anticoagulation protocol: Yes   Plan:  -Continue Xarelto 20 mg daily with supper -Minimum q72h CBC while inpatient -Monitor for bleeding  Narda Bonds 12/03/2014,11:17 PM

## 2014-12-03 NOTE — ED Notes (Signed)
Pt brought in by [facility]. Pt had a near syncopal episode at home. Pt describes feeling diaphoretic, lightheaded, and hot during near syncopal episodes. Pt denies chest pain, N/\V. Pt has a history of cardiac stents, CVA- no residual, and a-fib. EMS vital signs 148/84, HR 110-140's, SPO2 97% on RA.

## 2014-12-03 NOTE — ED Notes (Signed)
Cardiology at bedside.

## 2014-12-03 NOTE — H&P (Signed)
RFA: Afib with RVR: HPI: 69yoM with hx of Afib on Xarelto (prior stroke), CAD s/p PCI (per patient 1 vessel), nl EF who presents following a presyncopal event.  Patient states that earlier today he felt sudden onset "hot flash" with associated light headedness.  A heart rate monitor reportedly showed a rapid irregular heart rate (114s) and his blood pressure was so low his machine reported "error".  His light headedness improved, but his palpitations continued.   He called EMS where BP was stable but HR ranged from 110-140s.  ECG shows afib with RVR without ST changes.  He denies chest discomfort/n/v, currently feels well.  He is on diltiazem.  He denies missing any doses of Xarelto.  PMHx: Past Medical History  Diagnosis Date  . Essential hypertension, benign   . Dyslipidemia   . GERD (gastroesophageal reflux disease)   . Recurrent upper respiratory infection (URI)   . Sleep apnea     CPAP  . Arthritis   . Coronary atherosclerosis of native coronary artery     a. BMS to [facility] 2004 and 2007, otherwise mild nonobstructive disease. EF normal.  . CVA (cerebral infarction)     a. 07/2012 - AF discovered after stroke.  . Paroxysmal atrial fibrillation     a. Discovered after stroke.  . Obesity    Meds: No current facility-administered medications on file prior to encounter.   Current Outpatient Prescriptions on File Prior to Encounter  Medication Sig Dispense Refill  . acetaminophen (TYLENOL) 325 MG tablet Take 325-650 mg by mouth every 6 (six) hours as needed (pain).    Marland Kitchen atorvastatin (LIPITOR) 40 MG tablet Take 40 mg by mouth daily.    . fenofibrate 160 MG tablet TAKE 1 TABLET BY MOUTH EVERY DAY 30 tablet 0  . fluticasone (FLONASE) 50 MCG/ACT nasal spray Place 2 sprays into the nose daily as needed for allergies.     Marland Kitchen lisinopril (PRINIVIL,ZESTRIL) 20 MG tablet TAKE 1 TABLET BY MOUTH EVERY DAY 30 tablet 0  . metoprolol succinate (TOPROL-XL) 100 MG 24 hr tablet TAKE 1 TABLET  (100 MG TOTAL) BY MOUTH DAILY. TAKE WITH OR IMMEDIATELY FOLLOWING A MEAL. 90 tablet 3  . Multiple Vitamins-Minerals (MULTIVITAMINS THER. W/MINERALS) TABS Take 1 tablet by mouth daily.      . nitroGLYCERIN (NITROSTAT) 0.4 MG SL tablet Place 0.4 mg under the tongue every 5 (five) minutes as needed for chest pain.    Marland Kitchen omeprazole (PRILOSEC) 10 MG capsule TAKE ONE CAPSULE BY MOUTH EVERY DAY 30 capsule 0  . Tamsulosin HCl (FLOMAX) 0.4 MG CAPS Take 0.4 mg by mouth daily.      Alveda Reasons 20 MG TABS tablet TAKE 1 TABLET BY MOUTH EVERY DAY 30 tablet 0  . diclofenac sodium (VOLTAREN) 1 % GEL Apply 4 g topically 4 (four) times daily as needed (for pain / inflammation). Apply to left hand    . fenofibrate 160 MG tablet Take 160 mg by mouth daily.    Marland Kitchen HYDROcodone-acetaminophen (NORCO/VICODIN) 5-325 MG per tablet Take 1 tablet by mouth every 6 (six) hours as needed for moderate pain.    Marland Kitchen ondansetron (ZOFRAN) 4 MG tablet Take 4 mg by mouth daily.    . predniSONE (DELTASONE) 10 MG tablet Take 2 tablets (20 mg total) by mouth daily. (Patient not taking: Reported on 12/03/2014) 10 tablet 0   Soc Hx: History   Social History  . Marital Status: Married    Spouse Name: [name]  . Number of Children:  3  . Years of Education: 14   Occupational History  . Retired     Works at [employer] part time   Social History Main Topics  . Smoking status: Former Smoker -- 1.00 packs/day for 20 years    Types: Cigarettes    Quit date: 05/05/2003  . Smokeless tobacco: Never Used  . Alcohol Use: No  . Drug Use: No  . Sexual Activity: Not on file   Other Topics Concern  . Not on file   Social History Narrative   Patient lives at home with spouse.   Caffeine Use:    Fam Hx: Family History  Problem Relation Age of Onset  . Hypertension Mother   . Heart disease Father   . Heart attack Father    Allergies: Allergies  Allergen Reactions  . Codeine Nausea Only    Heavy amounts cause nausea  . Monopril  [Fosinopril Sodium] Other (See Comments)    Muscle aches & pain  . Ampicillin Rash   Physical Exam: BP 100/70 mmHg  Pulse 141  Temp(Src) 98.3 F (36.8 C)  Resp 18  Ht 6' 1.5" (1.867 m)  Wt 120.3 kg (265 lb 3.4 oz)  BMI 34.51 kg/m2  SpO2 93% WEll appearing 69yo in NAD JVP, high normal at 30 degrees Irregular, rapid without murmur Soft nt nd PP 2+ No edema AAO without gross focal deficit  Tele: Afib at 140s ECG: afib, narrow complex, no ST changes  Labs: 6.9 >-----------< 250           40.1  137  104  28 -------------------< 117 4.0    29    1.35  Trop .01 x1  ======================  Impression/Plan 69yoM with hx of PAF presents with near syncopal event in setting of afib with RVR.  Likely presyncopal with rapid HR and change in rhythm status.  Possible that he had sinus conversion pause causing presyncopal sensation, though this seems less likely as monitor showed tachycardia.  Plan: 1.  Admit to Cardiology 2.  C/w home meds: 3.  Patient states he has been adherent to xarelto, would plan DCCV in am, NPO after midnight 4.  Telemetry, watch for sinus pauses 5.  C/w dilt gtt titrate for HR < 110  Full Code  Terressa Koyanagi MD Cardiology Fellow

## 2014-12-04 ENCOUNTER — Encounter (HOSPITAL_COMMUNITY): Payer: Self-pay | Admitting: General Practice

## 2014-12-04 DIAGNOSIS — I4892 Unspecified atrial flutter: Secondary | ICD-10-CM

## 2014-12-04 DIAGNOSIS — I4891 Unspecified atrial fibrillation: Secondary | ICD-10-CM

## 2014-12-04 MED ORDER — METOPROLOL SUCCINATE ER 50 MG PO TB24: 150.0000 mg | ORAL_TABLET | Freq: Every day | ORAL | Status: DC

## 2014-12-04 MED ORDER — METOPROLOL SUCCINATE ER 100 MG PO TB24
100.0000 mg | ORAL_TABLET | Freq: Every day | ORAL | Status: DC
  Filled 2014-12-04: qty 1

## 2014-12-04 MED ORDER — OFF THE BEAT BOOK
Freq: Once | Status: DC
  Filled 2014-12-04: qty 1

## 2014-12-04 MED ORDER — METOPROLOL TARTRATE 1 MG/ML IV SOLN
5.0000 mg | Freq: Once | INTRAVENOUS | Status: DC
  Filled 2014-12-04: qty 5

## 2014-12-04 MED ORDER — METOPROLOL SUCCINATE ER 50 MG PO TB24
150.0000 mg | ORAL_TABLET | Freq: Every day | ORAL | Status: DC
  Administered 2014-12-04: 150 mg via ORAL
  Filled 2014-12-04: qty 1

## 2014-12-04 NOTE — Progress Notes (Addendum)
    Subjective:  Denies CP or dyspnea   Objective:  Filed Vitals:   12/03/14 2230 12/03/14 2321 12/03/14 2351 12/04/14 0437  BP: 100/70 105/91 109/73 99/43  Pulse: 141 142 144 65  Temp:   98.2 F (36.8 C) 98.5 F (36.9 C)  TempSrc:   Oral Oral  Resp: 18 15 16 18   Height:   6\' 1"  (1.854 m)   Weight:   264 lb 8.8 oz (120 kg)   SpO2: 93% 94% 93% 97%    Intake/Output from previous day: No intake or output data in the 24 hours ending 12/04/14 0805  Physical Exam: Physical exam: Well-developed well-nourished in no acute distress.  Skin is warm and dry.  HEENT is normal.  Neck is supple.  Chest is clear to auscultation with normal expansion.  Cardiovascular exam is regular rate and rhythm.  Abdominal exam nontender or distended. No masses palpated. Extremities show no edema. neuro grossly intact    Lab Results: Basic Metabolic Panel:  Recent Labs  12/03/14 1828  NA 137  K 4.6  CL 104  CO2 29  GLUCOSE 117*  BUN 28*  CREATININE 1.35  CALCIUM 9.1  MG 2.1  PHOS 3.0   CBC:  Recent Labs  12/03/14 1828  WBC 6.9  NEUTROABS 3.3  HGB 13.1  HCT 40.1  MCV 86.1  PLT 250    Assessment/Plan:  1 atrial flutter-patient presented with atrial flutter with rapid ventricular response. Also with history of atrial fibrillation. He has converted to sinus rhythm. Continue xarelto. Increase Toprol to 150 mg daily. If he has more frequent episodes in the future would consider an antiarrhythmic such as tikosyn. Repeat echocardiogram. 2 presyncope-no bradycardia noted on telemetry. Question related to onset of atrial arrhythmias. If he has more episodes in the future we'll plan outpatient monitor. 3 hypertension-blood pressure borderline. Given increase dose of Toprol will discontinue lisinopril. Patient will be discharged if LV function normal on echocardiogram and follow-up with Dr. Acie Fredrickson > 30 min PA and physician time D2  Kirk Ruths 12/04/2014, 8:05 AM   CHADS vasc  [place]

## 2014-12-04 NOTE — Progress Notes (Signed)
  Echocardiogram 2D Echocardiogram has been performed.  Lysle Rubens 12/04/2014, 12:25 PM

## 2014-12-04 NOTE — Progress Notes (Signed)
The patient's RN attempted to order a replacement bag of cardizem via pharmacy. Found there was only a one time order from the ED.   The nurse had to stop the drip at 0147 due to not having additional bag of cardizem.  Notified Dr. Karlyn Agee at [phone].  Patient currently in NSR in the 58s. VSS.    Will continue to monitor.   [name]   0200 Dr. Karlyn Agee returned phone call and ordered to stop the cardizem drip because patient had already converted to sinus rhythm. Also ordered his normal at home dose of metoprolol to start in am.   Will continue to monitor.   Corey [name]

## 2014-12-04 NOTE — Progress Notes (Signed)
Patient converted to sinus rhythm in the 60-70s around 1215am per Central Telemetry. VSS.   Will continue to monitor.   Earlie Lou

## 2014-12-04 NOTE — Progress Notes (Signed)
Pt arrived to the floor in afib in the 140s with cardizem drip going at 15mg /hour and no PRN ordered. Other VSS.  Paged Dr. Karlyn Agee with cardiology.   Pt is currently asymptomatic and resting comfortably. Pt verbalized understanding of use of call bell if he begins to feel symptomatic.   Will continue to monitor closely.   Earlie Lou

## 2014-12-04 NOTE — Progress Notes (Signed)
UR completed 

## 2014-12-04 NOTE — Discharge Summary (Signed)
Discharge Summary   Patient ID: Corey [name],  MRN: [id number], DOB/AGE: [id number] y.o.  Admit date: 12/03/2014 Discharge date: 12/04/2014  Primary Care Provider: [name] Primary Cardiologist: Dr. Acie Fredrickson  Discharge Diagnoses Principal Problem:   Atrial fibrillation with RVR Active Problems:   Coronary artery disease   Hypertension   Dyslipidemia   GERD (gastroesophageal reflux disease)   CVA (cerebral infarction)   OSA (obstructive sleep apnea)   PAC (premature atrial contraction)   Allergies Allergies  Allergen Reactions  . Codeine Nausea Only    Heavy amounts cause nausea  . Monopril [Fosinopril Sodium] Other (See Comments)    Muscle aches & pain  . Ampicillin Rash     Hospital Course  The patient is a 69 year old male with history of A. fib on Xarelto, CAD status post PCI who presented to [facility] on the night of 12/03/2014 with presyncopal event. He states that earlier that day, he felt sudden onset of "hot flush" with associated lightheadedness. The heart rate monitor reportedly showed rapid irregular heart rate with heart rate around 114 and his blood pressure was very low and could not be captured on his blood pressure machine. His lightheadedness improved however his palpitation continued. He called EMS who obtained blood pressure. His systolic blood pressure obtained by EMS was between 110 to 140s.   On arrival to [facility] ED, EKG showed atrial fib with RVR without significant ST changes. He was placed on diltiazem and made NPO to plan for DC cardioversion on the following day. He was seen on the following morning at which time he already converted to sinus rhythm on diltiazem alone. We have increased long acting Toprol to 150 mg daily. Echocardiogram was obtained prior to discharge which showed technically difficult study with EF 60%. He is deemed stable for discharge from cardiology perspective. I have arranged follow-up with Dr. Acie Fredrickson in the  clinic in 2 weeks. If he has more frequent in the future, we can consider antiarrhythmic medication such as Tikosyn.   Of note, his lisinopril is on hold. On followup, he will need to discuss with Dr. Acie Fredrickson whether he need to continue on ASA along with Xarelto.   Discharge Vitals Blood pressure 126/64, pulse 74, temperature 98.7 F (37.1 C), temperature source Oral, resp. rate 20, height 6\' 1"  (1.854 m), weight 264 lb 8.8 oz (120 kg), SpO2 97 %.  Filed Weights   12/03/14 1801 12/03/14 1806 12/03/14 2351  Weight: 260 lb (117.935 kg) 265 lb 3.4 oz (120.3 kg) 264 lb 8.8 oz (120 kg)    Labs  CBC  Recent Labs  12/03/14 1828  WBC 6.9  NEUTROABS 3.3  HGB 13.1  HCT 40.1  MCV 86.1  PLT 852   Basic Metabolic Panel  Recent Labs  12/03/14 1828  NA 137  K 4.6  CL 104  CO2 29  GLUCOSE 117*  BUN 28*  CREATININE 1.35  CALCIUM 9.1  MG 2.1  PHOS 3.0    Disposition  Pt is being discharged home today in good condition.  Follow-up Plans & Appointments      Follow-up Information    Follow up with Nahser, Wonda Cheng, MD On 12/23/2014.   Specialty:  Cardiology   Why:  2:15pm   Contact information:   [address] [place] [address] [phone]       Discharge Medications    Medication List    STOP taking these medications  diclofenac sodium 1 % Gel  Commonly known as:  VOLTAREN     HYDROcodone-acetaminophen 5-325 MG per tablet  Commonly known as:  NORCO/VICODIN     lisinopril 20 MG tablet  Commonly known as:  PRINIVIL,ZESTRIL     ondansetron 4 MG tablet  Commonly known as:  ZOFRAN     predniSONE 10 MG tablet  Commonly known as:  DELTASONE      TAKE these medications        acetaminophen 325 MG tablet  Commonly known as:  TYLENOL  Take 325-650 mg by mouth every 6 (six) hours as needed (pain).     aspirin EC 81 MG tablet  Take 81 mg by mouth daily.     atorvastatin 40 MG tablet  Commonly known as:  LIPITOR  Take 40 mg by  mouth daily.     fenofibrate 160 MG tablet  TAKE 1 TABLET BY MOUTH EVERY DAY     FLOMAX 0.4 MG Caps capsule  Generic drug:  tamsulosin  Take 0.4 mg by mouth daily.     fluticasone 50 MCG/ACT nasal spray  Commonly known as:  FLONASE  Place 2 sprays into the nose daily as needed for allergies.     metoprolol succinate 50 MG 24 hr tablet  Commonly known as:  TOPROL-XL  Take 3 tablets (150 mg total) by mouth daily.     multivitamins ther. w/minerals Tabs tablet  Take 1 tablet by mouth daily.     nitroGLYCERIN 0.4 MG SL tablet  Commonly known as:  NITROSTAT  Place 0.4 mg under the tongue every 5 (five) minutes as needed for chest pain.     omeprazole 10 MG capsule  Commonly known as:  PRILOSEC  TAKE ONE CAPSULE BY MOUTH EVERY DAY     VITAMIN B-12 PO  Take 1 tablet by mouth daily.     XARELTO 20 MG Tabs tablet  Generic drug:  rivaroxaban  TAKE 1 TABLET BY MOUTH EVERY DAY        Duration of Discharge Encounter   Greater than 30 minutes including physician time.  Hilbert Corrigan PA-C Pager: [phone] 12/04/2014, 3:47 PM

## 2014-12-04 NOTE — Progress Notes (Signed)
Dr. Karlyn Agee returned page and ordered 1 time dose 5mg  metoprolol.   Will continue to monitor .  Earlie Lou

## 2014-12-06 ENCOUNTER — Telehealth: Payer: Self-pay | Admitting: Cardiovascular Disease

## 2014-12-06 NOTE — Telephone Encounter (Signed)
Pt c/o medication issue:  1. Name of Medication: Lisinopril  2. How are you currently taking this medication (dosage and times per day)? 1x per day 20mg   3. Are you having a reaction (difficulty breathing--STAT)? No  4. What is your medication issue? Pt states that he was in the hospital for A-fib and the doctor recommended him to increase his Metoporol to 150 mg per day and to stop taking his Lisinopril. Pt is calling to find out if that is what Dr. Acie Fredrickson would recommend. Please call back and advise.

## 2014-12-06 NOTE — Telephone Encounter (Signed)
Spoke with patient who called to ask if Dr. Acie Fredrickson is in agreement with plan of care to hold Lisinopril and to increase Toprol XL to 150 mg daily.  I explained the need for the beta blocker with patient's atrial fib and that Lisinopril was likely held to keep BP from being too low.  I advised patient to monitor his heart rate and blood pressure over the next several days and to call back next week with questions or concerns.  Patient is aware of appointment to see Dr. Acie Fredrickson for post hospital follow-up on 3/21.  Patient verbalized understanding and agreement with plan of care.

## 2014-12-11 ENCOUNTER — Other Ambulatory Visit: Payer: Self-pay | Admitting: Cardiovascular Disease

## 2014-12-17 ENCOUNTER — Other Ambulatory Visit: Payer: Self-pay | Admitting: Cardiovascular Disease

## 2014-12-17 ENCOUNTER — Other Ambulatory Visit: Payer: Self-pay | Admitting: Adult Health

## 2014-12-23 ENCOUNTER — Ambulatory Visit (INDEPENDENT_AMBULATORY_CARE_PROVIDER_SITE_OTHER): Payer: Medicare Other | Admitting: Cardiovascular Disease

## 2014-12-23 ENCOUNTER — Other Ambulatory Visit: Payer: Self-pay | Admitting: Cardiovascular Disease

## 2014-12-23 ENCOUNTER — Encounter: Payer: Self-pay | Admitting: Cardiovascular Disease

## 2014-12-23 VITALS — BP 130/78 | HR 58 | Ht 73.0 in | Wt 276.6 lb

## 2014-12-23 DIAGNOSIS — I4891 Unspecified atrial fibrillation: Secondary | ICD-10-CM

## 2014-12-23 DIAGNOSIS — I2510 Atherosclerotic heart disease of native coronary artery without angina pectoris: Secondary | ICD-10-CM

## 2014-12-23 NOTE — Progress Notes (Signed)
Cardiology Office Note   Date:  12/23/2014   ID:  Corey [name], DOB 03-08-1946, MRN [id number]  PCP:  [name], MD  Cardiologist:   Thayer Headings, MD   Chief Complaint  Patient presents with  . Atrial Fibrillation   Problem List: 1.  . Coronary artery disease-status post PTCA and stenting of his right coronary artery 2. Hypertension 3. Hyperlipidemia 4. 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 Atrial fibrillation - discovered after his stroke  History of Present Illness: Corey [name] is a 69 yo gentleman with a history of coronary artery disease. Status post PTCA and stenting of his right coronary artery. He also has a history of hypertension, and hyperlipidemia. 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  He had a stroke in Oct. He had some speech problems and loss of fine motor control of the right arm. TEE was negative for PFO or LAA thrombus. Event monitor showed no A-Fib. He has been on Plavix since his stents in  January 05, 2013:  Daune had an episode of atrial fib in January. He was started on Xarelto and has done well.  He has had some fatigue on occasion. He monitors his BP.   He has recovered from his stroke.   Feb. 13, 2015:  Corey [name] is doing ok. Still having touble losing weight. Has had a URI for the past month or so. No CP, no palps. Has recovered well from his CVA. He is looking forward to being able to exercise more   December 23, 2014: Corey [name] is a 69 y.o. male who presents following recent hospitalization for rapid atrial fib. He presented to the hospital with syncope /presyncope.  He was found to have rapid atrial fibrillation.  Was started on IV diltiazem and converted by the next am. He has maintained NSR. Has gained some weight due to inactivity.     Past Medical History  Diagnosis Date  . Essential hypertension, benign   . Dyslipidemia   . GERD (gastroesophageal reflux disease)   . Recurrent upper respiratory  infection (URI)   . Paroxysmal atrial fibrillation     a. Discovered after stroke.  . Obesity   . PONV (postoperative nausea and vomiting)   . Atrial fibrillation with RVR   . Coronary atherosclerosis of native coronary artery     a. BMS to [facility] 2004 and 2007, otherwise mild nonobstructive disease. EF normal.  . Pneumonia 01/1996  . OSA on CPAP   . CVA (cerebral vascular accident) 07/2012    AF discovered after stroke; denies residual on 12/04/2014  . Arthritis     "back, right knee, neck" (12/04/2014)    Past Surgical History  Procedure Laterality Date  . Anterior cervical decomp/discectomy fusion  07/2001; 10/2002    "C5-6; C6-7; redo"  . Posterior lumbar fusion  10/2003    L5-S1; "plates, screws"  . Knee arthroscopy Left 10/2005  . 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 Tee without cardioversion  07/07/2012    Procedure: TRANSESOPHAGEAL ECHOCARDIOGRAM (TEE);  Surgeon: Fay Records, MD;  Location: [place];  Service: Cardiovascular;  Laterality: N/A;  . Shoulder arthroscopy Right 08/2011    Debridement of labrum, arthroscopic distal clavicle excision  . Tonsillectomy and adenoidectomy  ~ 1956  . Lumbar laminectomy/decompression microdiscectomy  03/2005    "L4-5"  . Knee arthroscopy w/ partial medial meniscectomy Left 09/2005  . Shoulder open rotator cuff repair Left 07/2014  .  Colonoscopy w/ polypectomy  02/2014  . Coronary angioplasty with stent placement  05/2003; 12/2005    "mid RCA; mid RCA"     Current Outpatient Prescriptions  Medication Sig Dispense Refill  . acetaminophen (TYLENOL) 325 MG tablet Take 325-650 mg by mouth every 6 (six) hours as needed (pain).    Marland Kitchen aspirin EC 81 MG tablet Take 81 mg by mouth daily.    Marland Kitchen atorvastatin (LIPITOR) 40 MG tablet Take 40 mg by mouth daily.    . Cyanocobalamin (VITAMIN B-12 PO) Take 1 tablet by mouth daily.    . fenofibrate 160 MG tablet TAKE 1 TABLET BY MOUTH EVERY DAY 30 tablet 0  . fluticasone (FLONASE) 50 MCG/ACT nasal spray  Place 2 sprays into the nose daily as needed for allergies.     . metoprolol succinate (TOPROL-XL) 50 MG 24 hr tablet Take 3 tablets (150 mg total) by mouth daily. 90 tablet 5  . Multiple Vitamins-Minerals (MULTIVITAMINS THER. W/MINERALS) TABS Take 1 tablet by mouth daily.      . nitroGLYCERIN (NITROSTAT) 0.4 MG SL tablet Place 0.4 mg under the tongue every 5 (five) minutes as needed for chest pain.    Marland Kitchen omeprazole (PRILOSEC) 10 MG capsule TAKE ONE CAPSULE BY MOUTH EVERY DAY 30 capsule 0  . Tamsulosin HCl (FLOMAX) 0.4 MG CAPS Take 0.4 mg by mouth daily.      Alveda Reasons 20 MG TABS tablet TAKE 1 TABLET BY MOUTH EVERY DAY 30 tablet 0   No current facility-administered medications for this visit.    Allergies:   Codeine; Monopril; and Ampicillin    Social History:  The patient  reports that he quit smoking about 11 years ago. His smoking use included Cigarettes. He has a 20 pack-year smoking history. He has never used smokeless tobacco. He reports that he does not drink alcohol or use illicit drugs.   Family History:  The patient's family history includes Heart attack in his father; Heart disease in his father; Hypertension in his mother.    ROS:  Please see the history of present illness.    Review of Systems: Constitutional:  denies fever, chills, diaphoresis, appetite change and fatigue.  HEENT: denies photophobia, eye pain, redness, hearing loss, ear pain, congestion, sore throat, rhinorrhea, sneezing, neck pain, neck stiffness and tinnitus.  Respiratory: denies SOB, DOE, cough, chest tightness, and wheezing.  Cardiovascular: denies chest pain, palpitations and leg swelling.  Gastrointestinal: denies nausea, vomiting, abdominal pain, diarrhea, constipation, blood in stool.  Genitourinary: denies dysuria, urgency, frequency, hematuria, flank pain and difficulty urinating.  Musculoskeletal: denies  myalgias, back pain, joint swelling, arthralgias and gait problem.   Skin: denies pallor,  rash and wound.  Neurological: denies dizziness, seizures, syncope, weakness, light-headedness, numbness and headaches.   Hematological: denies adenopathy, easy bruising, personal or family bleeding history.  Psychiatric/ Behavioral: denies suicidal ideation, mood changes, confusion, nervousness, sleep disturbance and agitation.       All other systems are reviewed and negative.    PHYSICAL EXAM: VS:  There were no vitals taken for this visit. , BMI There is no weight on file to calculate BMI. GEN: Well nourished, well developed, in no acute distress HEENT: normal Neck: no JVD, carotid bruits, or masses Cardiac: RRR; no murmurs, rubs, or gallops,no edema  Respiratory:  clear to auscultation bilaterally, normal work of breathing GI: soft, nontender, nondistended, + BS MS: no deformity or atrophy Skin: warm and dry, no rash Neuro:  Strength and sensation are intact Psych: normal  EKG:  EKG is ordered today. The ekg ordered today demonstrates sinus brady at 58. No ST or T wave change   Recent Labs: 04/05/2014: TSH 2.820 04/08/2014: ALT 26 12/03/2014: BUN 28*; Creatinine 1.35; Hemoglobin 13.1; Magnesium 2.1; Platelets 250; Potassium 4.6; Sodium 137    Lipid Panel    Component Value Date/Time   CHOL 120 04/08/2014 0912   CHOL 108 01/05/2013 0748   TRIG 146.0 04/08/2014 0912   TRIG 84 01/05/2013 0748   HDL 30.40* 04/08/2014 0912   HDL 29* 01/05/2013 0748   CHOLHDL 4 04/08/2014 0912   VLDL 29.2 04/08/2014 0912   LDLCALC 60 04/08/2014 0912   LDLCALC 62 01/05/2013 0748      Wt Readings from Last 3 Encounters:  12/03/14 264 lb 8.8 oz (120 kg)  05/23/14 272 lb 6.4 oz (123.56 kg)  05/09/14 268 lb (121.564 kg)      Other studies Reviewed: Additional studies/ records that were reviewed today include: . Review of the above records demonstrates:    ASSESSMENT AND PLAN:  1.  Coronary artery disease-status post PTCA and stenting of his right coronary artery - no angina  2.  Hypertension - BP is ok  3. Hyperlipidemia - will check next office visit  4. 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 Atrial fibrillation - discovered after his stroke- He had an episode of near syncope and went to the ER.  Was found to have atrial fib again.  Received IV  Dilt and converted to NSR . For now he is having very rare episodes of atrial fibrillation. I do not think that we need to change any of his medications.   I do not that we necessarily need to start him on a few rhythmic but he  has a history of coronary artery disease so we would would not be able to use flecainide. Is also fairly young and I do not think that we should use amiodarone at this point. He has further episodes of seizures a fibrillation we may want to consider Tikosyn.  Current medicines are reviewed at length with the patient today.  The patient does not have concerns regarding medicines.  The following changes have been made:  no change  Labs/ tests ordered today include:  No orders of the defined types were placed in this encounter.     Disposition:   FU with me in 6 months.     Signed, Emmanuell Kantz, Wonda Cheng, MD  12/23/2014 5:52 AM    [facility] [place], [place], [place]  [address] Phone: [phone]; Fax: [phone]

## 2014-12-23 NOTE — Patient Instructions (Addendum)
Your physician has recommended you make the following change in your medication:  STOP Aspirin   Your physician wants you to follow-up in: 6 months with Dr. Acie Fredrickson.  You will receive a reminder letter in the mail two months in advance. If you don't receive a letter, please call our office to schedule the follow-up appointment. Your physician recommends that you return for lab work in: 6 months on the day of or a few days before your office visit with Dr. Acie Fredrickson.  You will need to FAST for this appointment - nothing to eat or drink after midnight the night before except water.

## 2014-12-25 ENCOUNTER — Ambulatory Visit: Payer: Self-pay | Admitting: Pulmonary Disease

## 2015-01-22 ENCOUNTER — Other Ambulatory Visit: Payer: Self-pay | Admitting: Cardiovascular Disease

## 2015-02-04 ENCOUNTER — Encounter: Payer: Self-pay | Admitting: Neurology

## 2015-02-04 ENCOUNTER — Ambulatory Visit (INDEPENDENT_AMBULATORY_CARE_PROVIDER_SITE_OTHER): Payer: Self-pay | Admitting: Neurology

## 2015-02-04 ENCOUNTER — Ambulatory Visit (INDEPENDENT_AMBULATORY_CARE_PROVIDER_SITE_OTHER): Payer: Medicare Other | Admitting: Neurology

## 2015-02-04 DIAGNOSIS — M5416 Radiculopathy, lumbar region: Secondary | ICD-10-CM | POA: Diagnosis not present

## 2015-02-04 DIAGNOSIS — R202 Paresthesia of skin: Secondary | ICD-10-CM

## 2015-02-04 HISTORY — DX: Radiculopathy, lumbar region: M54.16

## 2015-02-04 NOTE — Procedures (Signed)
     HISTORY:  Corey [name] a 69 year old gentleman with a history of prior lumbosacral spine surgery and a history of obesity. He reports a history of low back pain without sciatica. More recently, he has developed pain in both feet associated with weightbearing. The pain is along the arch of the foot on the right, and around the great toe on the left foot. The pain associated with a burning sensation. The pain improves when he is off of his feet. He is being evaluated for possible neuropathy or a lumbosacral radiculopathy.  NERVE CONDUCTION STUDIES:  Nerve conduction studies were performed on both lower extremities. The distal motor latencies and motor amplitudes for the peroneal and posterior tibial nerves were within normal limits. The nerve conduction velocities for these nerves were also normal. The H reflex latencies were normal. The sensory latencies for the peroneal nerves were within normal limits. The medial and lateral plantar sensory latencies were within normal limits on both feet.   EMG STUDIES:  EMG study was performed on the right lower extremity:  The tibialis anterior muscle reveals 2 to 6K motor units with decreased recruitment. 2+ positive waves were seen. The peroneus tertius muscle reveals 2 to 5K motor units with decreased recruitment. No fibrillations or positive waves were seen. The medial gastrocnemius muscle reveals 1 to 3K motor units with full recruitment. No fibrillations or positive waves were seen. The vastus lateralis muscle reveals 2 to 4K motor units with full recruitment. No fibrillations or positive waves were seen. The iliopsoas muscle reveals 2 to 4K motor units with full recruitment. No fibrillations or positive waves were seen. The biceps femoris muscle (long head) reveals 2 to 5K motor units with decreased recruitment. No fibrillations or positive waves were seen. The lumbosacral paraspinal muscles were tested at 3 levels, and revealed no abnormalities  of insertional activity at the upper and middle levels tested. 1+ positive waves were seen at the lower level. There was good relaxation.  EMG study was performed on the left lower extremity:  The tibialis anterior muscle reveals 2 to 4K motor units with full recruitment. No fibrillations or positive waves were seen. The peroneus tertius muscle reveals 2 to 4K motor units with full recruitment. No fibrillations or positive waves were seen. The medial gastrocnemius muscle reveals 1 to 3K motor units with full recruitment. No fibrillations or positive waves were seen. The vastus lateralis muscle reveals 2 to 4K motor units with full recruitment. No fibrillations or positive waves were seen. The iliopsoas muscle reveals 2 to 4K motor units with full recruitment. No fibrillations or positive waves were seen. The biceps femoris muscle (long head) reveals 2 to 4K motor units with full recruitment. No fibrillations or positive waves were seen. The lumbosacral paraspinal muscles were tested at 3 levels, and revealed no abnormalities of insertional activity at middle and lower levels tested. 1+ positive waves were seen at the upper level. There was good relaxation.   IMPRESSION:  Nerve conduction studies done on both lower extremities were unremarkable, without evidence of a peripheral neuropathy. EMG evaluation of the right lower extremity shows findings consistent with a primarily chronic L5 radiculopathy, with some mild acute features. EMG evaluation of the left lower extremity was unremarkable, without evidence of an overlying lumbosacral radiculopathy.  Jill Alexanders MD 02/04/2015 11:33 AM  [facility] [address] [place], [place] [address]  Phone [phone] Fax [phone]

## 2015-02-04 NOTE — Progress Notes (Signed)
Please refer to EMG and nerve conduction study procedure note. 

## 2015-02-13 ENCOUNTER — Telehealth: Payer: Self-pay | Admitting: Cardiovascular Disease

## 2015-02-13 NOTE — Telephone Encounter (Signed)
Agree with note by Michelle Swinyer, RN  

## 2015-02-13 NOTE — Telephone Encounter (Signed)
Spoke with patient and advised him that elevated BP is an expected side effect of prednisone; he is also concerned about going into afib.  Patient reports he will take highest dose of 60 mg today, (took same dose yesterday) and then will taper down.  He reports he has taken Prednisone in the past without difficulty.  We discussed medication timing and decided he will take his Toprol XL 150 mg mid-day in order to coincide with timing of Prednisone dose.  I advised patient to call back with questions or concerns.  Patient verbalized understanding and agreement.

## 2015-02-13 NOTE — Telephone Encounter (Signed)
New message       Orthopedic doctor presc prednisone.  He took 1 dosage yesterday and his bp was 169/78 HR 95; today 142/79 HR 70.  He has AFIB.  He is due to take another pill today.  Please advise

## 2015-02-14 ENCOUNTER — Other Ambulatory Visit: Payer: Self-pay | Admitting: Cardiovascular Disease

## 2015-05-13 ENCOUNTER — Other Ambulatory Visit: Payer: Self-pay

## 2015-05-13 MED ORDER — FENOFIBRATE 160 MG PO TABS: 160.0000 mg | ORAL_TABLET | Freq: Every day | ORAL | Status: DC

## 2015-05-14 ENCOUNTER — Ambulatory Visit: Payer: Medicare Other | Admitting: Nurse Practitioner

## 2015-05-19 ENCOUNTER — Ambulatory Visit: Payer: Medicare Other | Admitting: Nurse Practitioner

## 2015-05-19 ENCOUNTER — Ambulatory Visit (INDEPENDENT_AMBULATORY_CARE_PROVIDER_SITE_OTHER): Payer: Medicare Other | Admitting: Nurse Practitioner

## 2015-05-19 ENCOUNTER — Encounter: Payer: Self-pay | Admitting: Nurse Practitioner

## 2015-05-19 ENCOUNTER — Ambulatory Visit: Payer: Medicare Other | Admitting: Neurology

## 2015-05-19 VITALS — BP 123/75 | HR 64 | Ht 73.5 in | Wt 274.8 lb

## 2015-05-19 DIAGNOSIS — E785 Hyperlipidemia, unspecified: Secondary | ICD-10-CM | POA: Diagnosis not present

## 2015-05-19 DIAGNOSIS — I10 Essential (primary) hypertension: Secondary | ICD-10-CM

## 2015-05-19 DIAGNOSIS — I63519 Cerebral infarction due to unspecified occlusion or stenosis of unspecified middle cerebral artery: Secondary | ICD-10-CM | POA: Diagnosis not present

## 2015-05-19 NOTE — Progress Notes (Signed)
[name]  PATIENT: MAMOUDOU MULVEHILL DOB: [birth year]   REASON FOR VISIT: Follow-up for MCA infarct October 2013 from atrial fibrillation HISTORY FROM: Patient    HISTORY OF PRESENT ILLNESS:69 y.o. 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 Vascular risk factors of hypertension, hyperlipidemia, atrial fibrillation and cardiac disease.   05/30/2013 he is seen today for followup of the last visit on 11/10/12. He is tolerating Xarelto well without significant bruising normally occasional minor bleeding. He continues to have mild occasional word finding difficulties particularly when tired. He has not had any recurrent stroke or TIA symptoms. He had carotid ultrasound done in April by Dr. Acie Fredrickson which was fine but I do not have the results. He has no new complaints today.   05/09/14 (LL): Since last visit, patient has been well. He has not had any recurrent stroke or TIA symptoms. Cholesterol is well controlled, it is total 120 and LDL 60, checked last month. Recent Myocardial Perfusion study Results were good. Last carotid doppler study completed at cardiology office in November 2014 showed no significant stenosis. Blood pressure is well controlled, it is 114/66 in the office today. He wore a heart monitor in June and was found to have PACs. He states he is compliant with his cpap every night. He is working on weight loss with increased activity, lower carb intake and smaller portions. He is tolerating Xarelto well without any significant bleeding or bruising. There no new complaints today. 05/19/2015 Mr. Patron 69 year old male returns for follow-up. He had MCA infarct in October 2013. He has not had further stroke or TIA symptoms since that time. He is currently on Xarelto for his atrial fib and  secondary stroke prevention. Blood pressure is in excellent control at 123/75 in the office today. Lipids  are followed by Dr.Nahser as well as his carotid Doppler which is to be repeated in October 2016. He has obstructive sleep apnea and is currently using CPAP at variable rates 5-13. He returns for reevaluation. He has no new neurologic complaints  REVIEW OF SYSTEMS: Full 14 system review of systems performed and notable only for those listed, all others are neg:  Constitutional: neg  Cardiovascular: Atrial fibrillation/PACs Ear/Nose/Throat: neg  Skin: neg Eyes: neg Respiratory: Shortness of breath Gastroitestinal: neg  Hematology/Lymphatic: neg  Endocrine: neg Musculoskeletal: Joint pain Allergy/Immunology: neg Neurological: neg Psychiatric: neg Sleep : Obstructive sleep apnea with CPAP   ALLERGIES: Allergies  Allergen Reactions  . Codeine Nausea Only    Heavy amounts cause nausea  . Monopril [Fosinopril Sodium] Other (See Comments)    Muscle aches & pain  . Ampicillin Rash    HOME MEDICATIONS: Outpatient Prescriptions Prior to Visit  Medication Sig Dispense Refill  . acetaminophen (TYLENOL) 325 MG tablet Take 325-650 mg by mouth every 6 (six) hours as needed (pain).    Marland Kitchen atorvastatin (LIPITOR) 40 MG tablet Take 40 mg by mouth daily.    . Cyanocobalamin (VITAMIN B-12 PO) Take 1 tablet by mouth daily.    . fenofibrate 160 MG tablet Take 1 tablet (160 mg total) by mouth daily. 90 tablet 0  . fluticasone (FLONASE) 50 MCG/ACT nasal spray Place 2 sprays into the nose daily as needed for allergies.     Marland Kitchen lisinopril (PRINIVIL,ZESTRIL) 10 MG tablet Take 10 mg by mouth daily.  5  . metoprolol succinate (TOPROL-XL) 50 MG 24 hr tablet Take 3 tablets (150 mg total) by mouth daily.  90 tablet 5  . Multiple Vitamins-Minerals (MULTIVITAMINS THER. W/MINERALS) TABS Take 1 tablet by mouth daily.      . nitroGLYCERIN (NITROSTAT) 0.4 MG SL tablet Place 0.4 mg under the tongue every 5 (five) minutes as needed for chest pain.    Marland Kitchen omeprazole (PRILOSEC) 10 MG capsule TAKE ONE CAPSULE BY MOUTH EVERY  DAY 30 capsule 11  . Tamsulosin HCl (FLOMAX) 0.4 MG CAPS Take 0.4 mg by mouth daily.      Alveda Reasons 20 MG TABS tablet TAKE 1 TABLET BY MOUTH EVERY DAY 30 tablet 6  . NITROSTAT 0.4 MG SL tablet PLACE 1 TABLET UNDER THE TONGUE EVERY MINUTES AS NEEDED FOR CHEST PAIN 25 tablet 2   No facility-administered medications prior to visit.    PAST MEDICAL HISTORY: Past Medical History  Diagnosis Date  . Essential hypertension, benign   . Dyslipidemia   . GERD (gastroesophageal reflux disease)   . Recurrent upper respiratory infection (URI)   . Paroxysmal atrial fibrillation     a. Discovered after stroke.  . Obesity   . PONV (postoperative nausea and vomiting)   . Atrial fibrillation with RVR   . Coronary atherosclerosis of native coronary artery     a. BMS to [facility] 2004 and 2007, otherwise mild nonobstructive disease. EF normal.  . Pneumonia 01/1996  . OSA on CPAP   . CVA (cerebral vascular accident) 07/2012    AF discovered after stroke; denies residual on 12/04/2014  . Arthritis     "back, right knee, neck" (12/04/2014)  . Lumbar radiculopathy, chronic 02/04/2015    Right L5    PAST SURGICAL HISTORY: Past Surgical History  Procedure Laterality Date  . Anterior cervical decomp/discectomy fusion  07/2001; 10/2002    "C5-6; C6-7; redo"  . Posterior lumbar fusion  10/2003    L5-S1; "plates, screws"  . Knee arthroscopy Left 10/2005  . 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 Tee without cardioversion  07/07/2012    Procedure: TRANSESOPHAGEAL ECHOCARDIOGRAM (TEE);  Surgeon: Fay Records, MD;  Location: [place];  Service: Cardiovascular;  Laterality: N/A;  . Shoulder arthroscopy Right 08/2011    Debridement of labrum, arthroscopic distal clavicle excision  . Tonsillectomy and adenoidectomy  ~ 1956  . Lumbar laminectomy/decompression microdiscectomy  03/2005    "L4-5"  . Knee arthroscopy w/ partial medial meniscectomy Left 09/2005  . Shoulder open rotator cuff repair Left 07/2014  .  Colonoscopy w/ polypectomy  02/2014  . Coronary angioplasty with stent placement  05/2003; 12/2005    "mid RCA; mid RCA"    FAMILY HISTORY: Family History  Problem Relation Age of Onset  . Hypertension Mother   . Heart disease Father   . Heart attack Father     SOCIAL HISTORY: Social History   Social History  . Marital Status: Married    Spouse Name: [name]  . Number of Children: 3  . Years of Education: 14   Occupational History  . Retired     Works at [employer] part time   Social History Main Topics  . Smoking status: Former Smoker -- 20 years    Types: Cigarettes    Quit date: 05/05/2003  . Smokeless tobacco: Never Used  . Alcohol Use: No  . Drug Use: No  . Sexual Activity: Yes   Other Topics Concern  . Not on file   Social History Narrative   Patient lives at home with spouse.   Caffeine Use:      PHYSICAL EXAM  Filed Vitals:   05/19/15 1132  BP: 123/75  Pulse: 64  Height: 6' 1.5" (1.867 m)  Weight: 274 lb 12.8 oz (124.648 kg)   Body mass index is 35.76 kg/(m^2). General: well developed, well nourished, obese male seated, in no evident distress  Head: head normocephalic and atraumatic. Orohparynx benign  Neck: supple with no carotid or supraclavicular bruits  Cardiovascular: regular rate and rhythm, no murmurs  Musculoskeletal: no deformity  Skin: no rash/petichiae. Left knee surgical scar from knee replacement.  Vascular: Normal pulses all extremities   Neurologic Exam  Mental Status: Awake and fully alert. Oriented to place and time. Recent and remote memory intact. Attention span, concentration and fund of knowledge appropriate. Mood and affect appropriate.  Cranial Nerves: Fundoscopic exam not done. Pupils equal, briskly reactive to light. Extraocular movements full without nystagmus. Visual fields full to confrontation. Hearing intact. Facial sensation intact. Face, tongue, palate moves normally and symmetrically.  Motor: Normal bulk  and tone. Normal strength in all tested extremity muscles.  Sensory.: intact to tough and pinprick and vibratory.  Coordination: Rapid alternating movements normal in all extremities. Finger-to-nose and heel-to-shin performed accurately bilaterally.  Gait and Station: Arises from chair without difficulty. Stance is normal. Gait demonstrates normal stride length and balance . Able to heel, toe and tandem walk without difficulty.  Reflexes: 1+ and symmetric. Toes downgoing.     DIAGNOSTIC DATA (LABS, IMAGING, TESTING) - I reviewed patient records, labs, notes, testing and imaging myself where available.  Lab Results  Component Value Date   WBC 6.9 12/03/2014   HGB 13.1 12/03/2014   HCT 40.1 12/03/2014   MCV 86.1 12/03/2014   PLT 250 12/03/2014      Component Value Date/Time   NA 137 12/03/2014 1828   K 4.6 12/03/2014 1828   CL 104 12/03/2014 1828   CO2 29 12/03/2014 1828   GLUCOSE 117* 12/03/2014 1828   BUN 28* 12/03/2014 1828   CREATININE 1.35 12/03/2014 1828   CALCIUM 9.1 12/03/2014 1828   PROT 7.3 04/08/2014 0912   ALBUMIN 4.1 04/08/2014 0912   AST 28 04/08/2014 0912   ALT 26 04/08/2014 0912   ALKPHOS 46 04/08/2014 0912   BILITOT 0.5 04/08/2014 0912   GFRNONAA 53* 12/03/2014 1828   GFRAA 61* 12/03/2014 1828   Lab Results  Component Value Date   CHOL 120 04/08/2014   HDL 30.40* 04/08/2014   LDLCALC 60 04/08/2014   TRIG 146.0 04/08/2014   CHOLHDL 4 04/08/2014    ASSESSMENT AND PLAN  69 y.o. year old male  has a past medical history of Essential hypertension, Dyslipidemia;  Paroxysmal atrial fibrillation; Obesity; Coronary atherosclerosis of native coronary artery;  OSA on CPAP; CVA (cerebral vascular accident) (07/2012);  here to follow-up. He has not had further stroke or TIA symptoms since October 2013.  Continue CPAP 5- 13 variable rate every night Keep systolic blood pressure less than 130, today's reading 123/75 Lipids are followed by Dr.Nahser last  cholesterol 120 LDL 60 Carotid Doppler to be repeated in October 2016 Dr. Acie Fredrickson Continue Xarelto for atrial fibrillation and secondary stroke prevention No further stroke or TIA symptoms since October 2013 If recurrent stroke symptoms occur, call 911 and proceed to the hospital Discharge from neurologic services at this time Dennie Bible, [facility], [facility], [facility] [address] [place], [place] [address] [phone]

## 2015-05-19 NOTE — Patient Instructions (Addendum)
Continue CPAP 5- 13 variable rate every night Keep systolic blood pressure less than 130, today's reading 123/75 Lipids are followed by Dr.Nahser last cholesterol 120 LDL 60 Carotid Doppler to be repeated in October 2016 Dr. Acie Fredrickson Continue Xarelto for atrial fibrillation and secondary stroke prevention No further stroke or TIA symptoms since October 2013 If recurrent stroke symptoms occur, call 911 and proceed to the hospital Discharge from neurologic services at this time

## 2015-07-04 ENCOUNTER — Telehealth: Payer: Self-pay | Admitting: Cardiovascular Disease

## 2015-07-04 ENCOUNTER — Other Ambulatory Visit: Payer: Self-pay | Admitting: Cardiovascular Disease

## 2015-07-04 DIAGNOSIS — I6523 Occlusion and stenosis of bilateral carotid arteries: Secondary | ICD-10-CM

## 2015-07-04 NOTE — Telephone Encounter (Signed)
New MEssage  Pt calling to discuss carotid w/ RN. No order in syst for carotid. Please call back and discuss.

## 2015-07-04 NOTE — Telephone Encounter (Signed)
Spoke with patient who states he needs yearly carotid duplex scan according to his neurologist.  I advised that our records indicate the same - last was done 07/16/14.  I scheduled him for carotid study on 10/4 and for 6 month f/u with Dr. Acie Fredrickson on 10/6.  He verbalized understanding and agreement and thanked me for the call.

## 2015-07-08 ENCOUNTER — Ambulatory Visit (HOSPITAL_COMMUNITY)
Admission: RE | Admit: 2015-07-08 | Discharge: 2015-07-08 | Disposition: A | Discharge status: Home / Self Care | Payer: Medicare Other | Source: Ambulatory Visit | Attending: Cardiovascular Disease | Admitting: Cardiovascular Disease

## 2015-07-08 DIAGNOSIS — I10 Essential (primary) hypertension: Secondary | ICD-10-CM | POA: Insufficient documentation

## 2015-07-08 DIAGNOSIS — I2510 Atherosclerotic heart disease of native coronary artery without angina pectoris: Secondary | ICD-10-CM | POA: Diagnosis not present

## 2015-07-08 DIAGNOSIS — E785 Hyperlipidemia, unspecified: Secondary | ICD-10-CM | POA: Diagnosis not present

## 2015-07-08 DIAGNOSIS — I6523 Occlusion and stenosis of bilateral carotid arteries: Secondary | ICD-10-CM

## 2015-07-10 ENCOUNTER — Ambulatory Visit (INDEPENDENT_AMBULATORY_CARE_PROVIDER_SITE_OTHER): Payer: Medicare Other | Admitting: Cardiovascular Disease

## 2015-07-10 ENCOUNTER — Encounter: Payer: Self-pay | Admitting: Cardiovascular Disease

## 2015-07-10 VITALS — BP 118/60 | HR 80 | Ht 73.5 in | Wt 272.8 lb

## 2015-07-10 DIAGNOSIS — I2510 Atherosclerotic heart disease of native coronary artery without angina pectoris: Secondary | ICD-10-CM | POA: Diagnosis not present

## 2015-07-10 DIAGNOSIS — I480 Paroxysmal atrial fibrillation: Secondary | ICD-10-CM

## 2015-07-10 NOTE — Patient Instructions (Signed)

## 2015-07-10 NOTE — Progress Notes (Signed)
Cardiology Office Note   Date:  07/10/2015   ID:  Corey [name], DOB 26-Mar-1946, MRN [id number]  PCP:  [name], MD  Cardiologist:   Thayer Headings, MD   Chief Complaint  Patient presents with  . Coronary Artery Disease   Problem List: 1.  . Coronary artery disease-status post PTCA and stenting of his right coronary artery 2. Hypertension 3. Hyperlipidemia 4. 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 Atrial fibrillation - discovered after his stroke  History of Present Illness: Corey [name] is a 69 yo gentleman with a history of coronary artery disease. Status post PTCA and stenting of his right coronary artery. He also has a history of hypertension, and hyperlipidemia. 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  He had a stroke in Oct. He had some speech problems and loss of fine motor control of the right arm. TEE was negative for PFO or LAA thrombus. Event monitor showed no A-Fib. He has been on Plavix since his stents in  January 05, 2013:  Corey [name] had an episode of atrial fib in January. He was started on Xarelto and has done well.  He has had some fatigue on occasion. He monitors his BP.   He has recovered from his stroke.   Feb. 13, 2015:  Corey [name] is doing ok. Still having touble losing weight. Has had a URI for the past month or so. No CP, no palps. Has recovered well from his CVA. He is looking forward to being able to exercise more   December 23, 2014: Corey [name] is a 69 y.o. male who presents following recent hospitalization for rapid atrial fib. He presented to the hospital with syncope /presyncope.  He was found to have rapid atrial fibrillation.  Was started on IV diltiazem and converted by the next am. He has maintained NSR. Has gained some weight due to inactivity.    07/10/2015:  Corey [name] is doing well.   No CP , no dyspnea.  No HR irregularities.   Has had rapid atrial fib in March , 2016 . Is no Xarelto    Past Medical History    Diagnosis Date  . Essential hypertension, benign   . Dyslipidemia   . GERD (gastroesophageal reflux disease)   . Recurrent upper respiratory infection (URI)   . Paroxysmal atrial fibrillation ([place])     a. Discovered after stroke.  . Obesity   . PONV (postoperative nausea and vomiting)   . Atrial fibrillation with RVR ([place])   . Coronary atherosclerosis of native coronary artery     a. BMS to [facility] 2004 and 2007, otherwise mild nonobstructive disease. EF normal.  . Pneumonia 01/1996  . OSA on CPAP   . CVA (cerebral vascular accident) ([place]) 07/2012    AF discovered after stroke; denies residual on 12/04/2014  . Arthritis     "back, right knee, neck" (12/04/2014)  . Lumbar radiculopathy, chronic 02/04/2015    Right L5    Past Surgical History  Procedure Laterality Date  . Anterior cervical decomp/discectomy fusion  07/2001; 10/2002    "C5-6; C6-7; redo"  . Posterior lumbar fusion  10/2003    L5-S1; "plates, screws"  . Knee arthroscopy Left 10/2005  . 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 Tee without cardioversion  07/07/2012    Procedure: TRANSESOPHAGEAL ECHOCARDIOGRAM (TEE);  Surgeon: Fay Records, MD;  Location: [place];  Service: Cardiovascular;  Laterality: N/A;  . Shoulder arthroscopy Right 08/2011  Debridement of labrum, arthroscopic distal clavicle excision  . Tonsillectomy and adenoidectomy  ~ 1956  . Lumbar laminectomy/decompression microdiscectomy  03/2005    "L4-5"  . Knee arthroscopy w/ partial medial meniscectomy Left 09/2005  . Shoulder open rotator cuff repair Left 07/2014  . Colonoscopy w/ polypectomy  02/2014  . Coronary angioplasty with stent placement  05/2003; 12/2005    "mid RCA; mid RCA"     Current Outpatient Prescriptions  Medication Sig Dispense Refill  . acetaminophen (TYLENOL 8 HOUR ARTHRITIS PAIN) 650 MG CR tablet Take 1,300 mg by mouth 2 (two) times daily as needed for pain (pain).    Marland Kitchen atorvastatin (LIPITOR) 40 MG tablet Take 40 mg by mouth daily.     Marland Kitchen atorvastatin (LIPITOR) 40 MG tablet TAKE 1 TABLET BY MOUTH EVERY DAY 90 tablet 3  . Cyanocobalamin (VITAMIN B-12 PO) Take 1 tablet by mouth daily.    . fenofibrate 160 MG tablet Take 1 tablet (160 mg total) by mouth daily. 90 tablet 0  . fluticasone (FLONASE) 50 MCG/ACT nasal spray Place 2 sprays into the nose daily as needed for allergies.     Marland Kitchen lisinopril (PRINIVIL,ZESTRIL) 10 MG tablet Take 10 mg by mouth daily.  5  . metoprolol succinate (TOPROL-XL) 50 MG 24 hr tablet Take 3 tablets (150 mg total) by mouth daily. 90 tablet 5  . Multiple Vitamins-Minerals (MULTIVITAMINS THER. W/MINERALS) TABS Take 1 tablet by mouth daily.      . nitroGLYCERIN (NITROSTAT) 0.4 MG SL tablet Place 0.4 mg under the tongue every 5 (five) minutes as needed for chest pain.    Marland Kitchen NU-IRON 150 MG capsule Take 150 mg by mouth daily.  5  . omeprazole (PRILOSEC) 10 MG capsule TAKE ONE CAPSULE BY MOUTH EVERY DAY 30 capsule 11  . Tamsulosin HCl (FLOMAX) 0.4 MG CAPS Take 0.4 mg by mouth daily.      Alveda Reasons 20 MG TABS tablet TAKE 1 TABLET BY MOUTH EVERY DAY 30 tablet 6   No current facility-administered medications for this visit.    Allergies:   Codeine; Monopril; and Ampicillin    Social History:  The patient  reports that he quit smoking about 12 years ago. His smoking use included Cigarettes. He quit after 20 years of use. He has never used smokeless tobacco. He reports that he does not drink alcohol or use illicit drugs.   Family History:  The patient's family history includes Heart attack in his father; Heart disease in his father; Hypertension in his mother.    ROS:  Please see the history of present illness.    Review of Systems: Constitutional:  denies fever, chills, diaphoresis, appetite change and fatigue.  HEENT: denies photophobia, eye pain, redness, hearing loss, ear pain, congestion, sore throat, rhinorrhea, sneezing, neck pain, neck stiffness and tinnitus.  Respiratory: denies SOB, DOE, cough,  chest tightness, and wheezing.  Cardiovascular: denies chest pain, palpitations and leg swelling.  Gastrointestinal: denies nausea, vomiting, abdominal pain, diarrhea, constipation, blood in stool.  Genitourinary: denies dysuria, urgency, frequency, hematuria, flank pain and difficulty urinating.  Musculoskeletal: denies  myalgias, back pain, joint swelling, arthralgias and gait problem.   Skin: denies pallor, rash and wound.  Neurological: denies dizziness, seizures, syncope, weakness, light-headedness, numbness and headaches.   Hematological: denies adenopathy, easy bruising, personal or family bleeding history.  Psychiatric/ Behavioral: denies suicidal ideation, mood changes, confusion, nervousness, sleep disturbance and agitation.       All other systems are reviewed and negative.  PHYSICAL EXAM: VS:  BP 118/60 mmHg  Pulse 80  Ht 6' 1.5" (1.867 m)  Wt 123.741 kg (272 lb 12.8 oz)  BMI 35.50 kg/m2 , BMI Body mass index is 35.5 kg/(m^2). GEN: Well nourished, well developed, in no acute distress HEENT: normal Neck: no JVD, carotid bruits, or masses Cardiac: RRR; no murmurs, rubs, or gallops,no edema  Respiratory:  clear to auscultation bilaterally, normal work of breathing GI: soft, nontender, nondistended, + BS MS: no deformity or atrophy Skin: warm and dry, no rash Neuro:  Strength and sensation are intact Psych: normal   EKG:  EKG is ordered today. The ekg ordered today demonstrates sinus brady at 58. No ST or T wave change   Recent Labs: 12/03/2014: BUN 28*; Creatinine, Ser 1.35; Hemoglobin 13.1; Magnesium 2.1; Platelets 250; Potassium 4.6; Sodium 137    Lipid Panel    Component Value Date/Time   CHOL 120 04/08/2014 0912   CHOL 108 01/05/2013 0748   TRIG 146.0 04/08/2014 0912   TRIG 84 01/05/2013 0748   HDL 30.40* 04/08/2014 0912   HDL 29* 01/05/2013 0748   CHOLHDL 4 04/08/2014 0912   VLDL 29.2 04/08/2014 0912   LDLCALC 60 04/08/2014 0912   LDLCALC 62  01/05/2013 0748      Wt Readings from Last 3 Encounters:  07/10/15 123.741 kg (272 lb 12.8 oz)  05/19/15 124.648 kg (274 lb 12.8 oz)  12/23/14 125.465 kg (276 lb 9.6 oz)      Other studies Reviewed: Additional studies/ records that were reviewed today include: . Review of the above records demonstrates:    ASSESSMENT AND PLAN:  1.  Coronary artery disease-status post PTCA and stenting of his right coronary artery - no angina, Encouraged him to exercise   2. Hypertension - BP is ok  3. Hyperlipidemia - will check next office visit  4. 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 Atrial fibrillation - discovered after his stroke-  CHADS2VASC is   54 ( age, CAD, CVA, HTN, )   He is currently on Xarelto.  Continue meds.   Current medicines are reviewed at length with the patient today.  The patient does not have concerns regarding medicines.  The following changes have been made:  no change  Labs/ tests ordered today include:  No orders of the defined types were placed in this encounter.     Disposition:   FU with me in 6 months.     Signed, Nahser, Wonda Cheng, MD  07/10/2015 11:03 AM    [address], [place], [place]  [address] Phone: [phone]; Fax: [phone]

## 2015-07-23 ENCOUNTER — Other Ambulatory Visit: Payer: Self-pay | Admitting: Cardiovascular Disease

## 2015-08-30 ENCOUNTER — Other Ambulatory Visit: Payer: Self-pay | Admitting: Cardiovascular Disease

## 2015-09-02 ENCOUNTER — Other Ambulatory Visit: Payer: Self-pay | Admitting: *Deleted

## 2015-09-02 MED ORDER — METOPROLOL SUCCINATE ER 50 MG PO TB24: 150.0000 mg | ORAL_TABLET | Freq: Every day | ORAL | Status: DC

## 2015-09-02 NOTE — Telephone Encounter (Signed)
Dose should be 150 mg daily #270 with 3 refills Thank you

## 2015-09-02 NOTE — Telephone Encounter (Signed)
Patient/pharmacy requesting refill on metoprolol succinate '100mg'$  qd. Just wanted to clarify that his current therapy should still be metoprolol succinate '50mg'$  with a sig of take '150mg'$  qd. Please advise. Thanks, MI

## 2015-09-12 ENCOUNTER — Other Ambulatory Visit: Payer: Self-pay | Admitting: Cardiovascular Disease

## 2015-10-05 HISTORY — PX: CARPAL TUNNEL RELEASE: SHX101

## 2015-10-05 HISTORY — PX: TRIGGER FINGER RELEASE: SHX641

## 2015-10-07 ENCOUNTER — Telehealth: Payer: Self-pay | Admitting: Cardiovascular Disease

## 2015-10-07 NOTE — Telephone Encounter (Signed)
New message      Pt states that we received a surgical clearance from Dr Roseanne Kaufman on 09-12-15 and they have not received it back.  Pt want a call back today explaining what is the hold up.  He stated again that he want a call back today

## 2015-10-07 NOTE — Telephone Encounter (Signed)
[facility] and requested another surgical clearance form be faxed.  Form received. I called and spoke with patient and apologized that clearance had not yet been received by Dr. Vanetta Shawl office.  I advised him that Dr. Acie Fredrickson will sign tomorrow and I will make certain form is received by Orson Slick, surgical scheduler, before I leave tomorrow.  He thanked me for the call.

## 2015-10-08 NOTE — Telephone Encounter (Signed)
Surgical clearance sent; fax confirmation received

## 2015-10-10 ENCOUNTER — Encounter: Payer: Self-pay | Admitting: Adult Health

## 2015-10-10 ENCOUNTER — Ambulatory Visit (INDEPENDENT_AMBULATORY_CARE_PROVIDER_SITE_OTHER): Payer: Medicare Other | Admitting: Adult Health

## 2015-10-10 VITALS — BP 124/86 | HR 60 | Temp 98.4°F | Ht 74.0 in | Wt 281.0 lb

## 2015-10-10 DIAGNOSIS — G4733 Obstructive sleep apnea (adult) (pediatric): Secondary | ICD-10-CM

## 2015-10-10 NOTE — Progress Notes (Signed)
Subjective:    Patient ID: Corey [name], male    DOB: 11-May-1946, 70 y.o.   MRN: [id number]  HPI 70 yo male with severe OSA on CPAP  Former on Dr. Gwenette Greet patient   TEST :  NPSG 2006: AHI 42/hr   10/10/2015 Follow up : OSA  Pt returns for 1 year follow up for severe OSA    Uses CPAP 6-7 hours nightly. Would like to discuss new mask options and pressure settings.  Download on autoset 5-15 shows excellent compliance with avg usage at ~8hr  Minimal leaks. AHI 3.4.  Pt is having carpal tunnel surgery on 10/21/15 on left wrist. OP procedure. Discussed to make sure he advising surgeon that he has OSA on CPAP.  He denies chest pain, orthopnea, edema or fever.     Past Medical History  Diagnosis Date  . Essential hypertension, benign   . Dyslipidemia   . GERD (gastroesophageal reflux disease)   . Recurrent upper respiratory infection (URI)   . Paroxysmal atrial fibrillation ([place])     a. Discovered after stroke.  . Obesity   . PONV (postoperative nausea and vomiting)   . Atrial fibrillation with RVR ([place])   . Coronary atherosclerosis of native coronary artery     a. BMS to [facility] 2004 and 2007, otherwise mild nonobstructive disease. EF normal.  . Pneumonia 01/1996  . OSA on CPAP   . CVA (cerebral vascular accident) ([place]) 07/2012    AF discovered after stroke; denies residual on 12/04/2014  . Arthritis     "back, right knee, neck" (12/04/2014)  . Lumbar radiculopathy, chronic 02/04/2015    Right L5   Current Outpatient Prescriptions on File Prior to Visit  Medication Sig Dispense Refill  . acetaminophen (TYLENOL 8 HOUR ARTHRITIS PAIN) 650 MG CR tablet Take 1,300 mg by mouth 2 (two) times daily as needed for pain (pain).    Marland Kitchen atorvastatin (LIPITOR) 40 MG tablet Take 40 mg by mouth daily.    . Cyanocobalamin (VITAMIN B-12 PO) Take 1 tablet by mouth daily.    . fenofibrate 160 MG tablet TAKE 1 TABLET (160 MG TOTAL) BY MOUTH DAILY. 90 tablet 3  . fluticasone (FLONASE) 50 MCG/ACT nasal spray  Place 2 sprays into the nose daily as needed for allergies.     Marland Kitchen lisinopril (PRINIVIL,ZESTRIL) 10 MG tablet Take 10 mg by mouth daily.  5  . metoprolol succinate (TOPROL-XL) 50 MG 24 hr tablet Take 3 tablets (150 mg total) by mouth daily. 270 tablet 3  . Multiple Vitamins-Minerals (MULTIVITAMINS THER. W/MINERALS) TABS Take 1 tablet by mouth daily.      . nitroGLYCERIN (NITROSTAT) 0.4 MG SL tablet Place 0.4 mg under the tongue every 5 (five) minutes as needed for chest pain.    Marland Kitchen NU-IRON 150 MG capsule Take 150 mg by mouth daily.  5  . omeprazole (PRILOSEC) 10 MG capsule TAKE ONE CAPSULE BY MOUTH EVERY DAY 30 capsule 11  . Tamsulosin HCl (FLOMAX) 0.4 MG CAPS Take 0.4 mg by mouth daily.      Alveda Reasons 20 MG TABS tablet TAKE 1 TABLET BY MOUTH EVERY DAY 30 tablet 6   No current facility-administered medications on file prior to visit.      Review of Systems Constitutional:   No  weight loss, night sweats,  Fevers, chills, fatigue, or  lassitude.  HEENT:   No headaches,  Difficulty swallowing,  Tooth/dental problems, or  Sore throat,  No sneezing, itching, ear ache, nasal congestion, post nasal drip,   CV:  No chest pain,  Orthopnea, PND, swelling in lower extremities, anasarca, dizziness, palpitations, syncope.   GI  No heartburn, indigestion, abdominal pain, nausea, vomiting, diarrhea, change in bowel habits, loss of appetite, bloody stools.   Resp: No shortness of breath with exertion or at rest.  No excess mucus, no productive cough,  No non-productive cough,  No coughing up of blood.  No change in color of mucus.  No wheezing.  No chest wall deformity  Skin: no rash or lesions.  GU: no dysuria, change in color of urine, no urgency or frequency.  No flank pain, no hematuria   MS:  No joint pain or swelling.  No decreased range of motion.  No back pain.  Psych:  No change in mood or affect. No depression or anxiety.  No memory loss.         Objective:   Physical  Exam Filed Vitals:   10/10/15 0914  BP: 124/86  Pulse: 60  Temp: 98.4 F (36.9 C)  TempSrc: Oral  Height: '6\' 2"'$  (1.88 m)  Weight: 281 lb (127.461 kg)  SpO2: 96%   Body mass index is 36.06 kg/(m^2).  GEN: A/Ox3; pleasant , NAD, morbidly obese   HEENT:  [place]/AT,  EACs-clear, TMs-wnl, NOSE-clear, THROAT-clear, no lesions, no postnasal drip or exudate noted. Class 2-3 MP airway   NECK:  Supple w/ fair ROM; no JVD; normal carotid impulses w/o bruits; no thyromegaly or nodules palpated; no lymphadenopathy.  RESP  Clear  P & A; w/o, wheezes/ rales/ or rhonchi.no accessory muscle use, no dullness to percussion  CARD:  RRR, no m/r/g  , no peripheral edema, pulses intact, no cyanosis or clubbing.  GI:   Soft & nt; nml bowel sounds; no organomegaly or masses detected.  Musco: Warm bil, no deformities or joint swelling noted.   Neuro: alert, no focal deficits noted.    Skin: Warm, no lesions or rashes        Assessment & Plan:

## 2015-10-10 NOTE — Patient Instructions (Signed)
Continue with CPAP At bedtime   Orders sent for supplies and mask of choice.  Do not drive if sleepy.  Work on weight loss Follow up Dr. Halford Chessman  In 1 year and As needed

## 2015-10-13 NOTE — Assessment & Plan Note (Signed)
Severe OSA -well controlled on CPAP   Plan  Continue with CPAP At bedtime   Orders sent for supplies and mask of choice.  Do not drive if sleepy.  Work on weight loss Follow up Dr. Halford Chessman  In 1 year and As needed

## 2015-10-20 ENCOUNTER — Other Ambulatory Visit: Payer: Self-pay | Admitting: *Deleted

## 2015-10-20 MED ORDER — OMEPRAZOLE 10 MG PO CPDR: 10.0000 mg | DELAYED_RELEASE_CAPSULE | Freq: Every day | ORAL | Status: DC

## 2015-11-18 ENCOUNTER — Telehealth: Payer: Self-pay | Admitting: Adult Health

## 2015-11-18 DIAGNOSIS — G4733 Obstructive sleep apnea (adult) (pediatric): Secondary | ICD-10-CM

## 2015-11-18 NOTE — Telephone Encounter (Signed)
Per verbal order from [facility] Okay to send order  Order has been placed. Nothing further needed at this time.

## 2015-11-18 NOTE — Telephone Encounter (Signed)
Called [facility], spoke with Levada Dy, states that they need a Rx for Resmed F20 large mask. Please advise Tammy. Thanks.

## 2015-11-21 ENCOUNTER — Encounter: Payer: Self-pay | Admitting: Adult Health

## 2015-12-15 ENCOUNTER — Other Ambulatory Visit: Payer: Self-pay | Admitting: Physician Assistant

## 2015-12-15 NOTE — Telephone Encounter (Signed)
Please review for refill, Thank you. 

## 2016-01-05 ENCOUNTER — Encounter: Payer: Self-pay | Admitting: Cardiovascular Disease

## 2016-01-05 ENCOUNTER — Ambulatory Visit (INDEPENDENT_AMBULATORY_CARE_PROVIDER_SITE_OTHER): Payer: Medicare Other | Admitting: Cardiovascular Disease

## 2016-01-05 VITALS — BP 122/72 | HR 58 | Ht 74.0 in | Wt 270.0 lb

## 2016-01-05 DIAGNOSIS — R5383 Other fatigue: Secondary | ICD-10-CM

## 2016-01-05 DIAGNOSIS — I480 Paroxysmal atrial fibrillation: Secondary | ICD-10-CM

## 2016-01-05 DIAGNOSIS — I10 Essential (primary) hypertension: Secondary | ICD-10-CM

## 2016-01-05 DIAGNOSIS — E611 Iron deficiency: Secondary | ICD-10-CM | POA: Diagnosis not present

## 2016-01-05 DIAGNOSIS — I2510 Atherosclerotic heart disease of native coronary artery without angina pectoris: Secondary | ICD-10-CM

## 2016-01-05 LAB — COMPREHENSIVE METABOLIC PANEL
ALT: 40 U/L (ref 9–46)
AST: 29 U/L (ref 10–35)
Albumin: 4.2 g/dL (ref 3.6–5.1)
Alkaline Phosphatase: 40 U/L (ref 40–115)
BUN: 25 mg/dL (ref 7–25)
CO2: 23 mmol/L (ref 20–31)
Calcium: 9.2 mg/dL (ref 8.6–10.3)
Chloride: 106 mmol/L (ref 98–110)
Creat: 0.99 mg/dL (ref 0.70–1.25)
Glucose, Bld: 110 mg/dL — ABNORMAL HIGH (ref 65–99)
Potassium: 4.4 mmol/L (ref 3.5–5.3)
Sodium: 140 mmol/L (ref 135–146)
Total Bilirubin: 0.8 mg/dL (ref 0.2–1.2)
Total Protein: 6.6 g/dL (ref 6.1–8.1)

## 2016-01-05 LAB — CBC WITH DIFFERENTIAL/PLATELET
Basophils Absolute: 46 cells/uL (ref 0–200)
Basophils Relative: 1 %
Eosinophils Absolute: 276 cells/uL (ref 15–500)
Eosinophils Relative: 6 %
HCT: 41.6 % (ref 38.5–50.0)
Hemoglobin: 13.8 g/dL (ref 13.2–17.1)
Lymphocytes Relative: 35 %
Lymphs Abs: 1610 cells/uL (ref 850–3900)
MCH: 29.8 pg (ref 27.0–33.0)
MCHC: 33.2 g/dL (ref 32.0–36.0)
MCV: 89.8 fL (ref 80.0–100.0)
MPV: 10.3 fL (ref 7.5–12.5)
Monocytes Absolute: 690 cells/uL (ref 200–950)
Monocytes Relative: 15 %
Neutro Abs: 1978 cells/uL (ref 1500–7800)
Neutrophils Relative %: 43 %
Platelets: 267 10*3/uL (ref 140–400)
RBC: 4.63 MIL/uL (ref 4.20–5.80)
RDW: 14.2 % (ref 11.0–15.0)
WBC: 4.6 10*3/uL (ref 3.8–10.8)

## 2016-01-05 LAB — IRON,TIBC AND FERRITIN PANEL
%SAT: 31 % (ref 15–60)
Ferritin: 47 ng/mL (ref 20–380)
Iron: 113 ug/dL (ref 50–180)
TIBC: 367 ug/dL (ref 250–425)

## 2016-01-05 LAB — LIPID PANEL
Cholesterol: 112 mg/dL — ABNORMAL LOW (ref 125–200)
HDL: 30 mg/dL — ABNORMAL LOW (ref >=40)
LDL Cholesterol: 64 mg/dL (ref <130)
Total CHOL/HDL Ratio: 3.7 Ratio (ref <=5.0)
Triglycerides: 91 mg/dL (ref <150)
VLDL: 18 mg/dL (ref <30)

## 2016-01-05 MED ORDER — METOPROLOL SUCCINATE ER 50 MG PO TB24
100.0000 mg | ORAL_TABLET | Freq: Every day | ORAL | Status: DC
Start: 2016-01-05 — End: 2016-12-01

## 2016-01-05 NOTE — Progress Notes (Signed)
Cardiology Office Note   Date:  01/05/2016   ID:  Corey [name], DOB [birth year], MRN [id number]  PCP:  [name], MD  Cardiologist:   Thayer Headings, MD   Chief Complaint  Patient presents with  . Coronary Artery Disease   Problem List: 1.  . Coronary artery disease-status post PTCA and stenting of his right coronary artery 2. Hypertension 3. Hyperlipidemia 4. 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 Atrial fibrillation - discovered after his stroke  History of Present Illness: Corey [name] is a 70 yo gentleman with a history of coronary artery disease. Status post PTCA and stenting of his right coronary artery. He also has a history of hypertension, and hyperlipidemia. 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  He had a stroke in Oct. He had some speech problems and loss of fine motor control of the right arm. TEE was negative for PFO or LAA thrombus. Event monitor showed no A-Fib. He has been on Plavix since his stents in  January 05, 2013:  [name] had an episode of atrial fib in January. He was started on Xarelto and has done well.  He has had some fatigue on occasion. He monitors his BP.   He has recovered from his stroke.   Feb. 13, 2015:  Corey [name] is doing ok. Still having touble losing weight. Has had a URI for the past month or so. No CP, no palps. Has recovered well from his CVA. He is looking forward to being able to exercise more   December 23, 2014: [name] is a 70 y.o. male who presents following recent hospitalization for rapid atrial fib. He presented to the hospital with syncope /presyncope.  He was found to have rapid atrial fibrillation.  Was started on IV diltiazem and converted by the next am. He has maintained NSR. Has gained some weight due to inactivity.    07/10/2015:  [name] is doing well.   No CP , no dyspnea.  No HR irregularities.   Has had rapid atrial fib in March , 2016 . Is no Xarelto   January 05, 2016:  Corey [name] is  seen back for CAD and HTN.and PAF Also has anemia and has been found to have low iron.  Has had a colonoscopy ~ 2 years ago.   Negative stool guiac.      Past Medical History  Diagnosis Date  . Essential hypertension, benign   . Dyslipidemia   . GERD (gastroesophageal reflux disease)   . Recurrent upper respiratory infection (URI)   . Paroxysmal atrial fibrillation ([place])     a. Discovered after stroke.  . Obesity   . PONV (postoperative nausea and vomiting)   . Atrial fibrillation with RVR ([place])   . Coronary atherosclerosis of native coronary artery     a. BMS to [facility] 2004 and 2007, otherwise mild nonobstructive disease. EF normal.  . Pneumonia 01/1996  . OSA on CPAP   . CVA (cerebral vascular accident) ([place]) 07/2012    AF discovered after stroke; denies residual on 12/04/2014  . Arthritis     "back, right knee, neck" (12/04/2014)  . Lumbar radiculopathy, chronic 02/04/2015    Right L5    Past Surgical History  Procedure Laterality Date  . Anterior cervical decomp/discectomy fusion  07/2001; 10/2002    "C5-6; C6-7; redo"  . Posterior lumbar fusion  10/2003    L5-S1; "plates, screws"  . Knee arthroscopy Left 10/2005  .  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 Tee without cardioversion  07/07/2012    Procedure: TRANSESOPHAGEAL ECHOCARDIOGRAM (TEE);  Surgeon: Fay Records, MD;  Location: [facility];  Service: Cardiovascular;  Laterality: N/A;  . Shoulder arthroscopy Right 08/2011    Debridement of labrum, arthroscopic distal clavicle excision  . Tonsillectomy and adenoidectomy  ~ 1956  . Lumbar laminectomy/decompression microdiscectomy  03/2005    "L4-5"  . Knee arthroscopy w/ partial medial meniscectomy Left 09/2005  . Shoulder open rotator cuff repair Left 07/2014  . Colonoscopy w/ polypectomy  02/2014  . Coronary angioplasty with stent placement  05/2003; 12/2005    "mid RCA; mid RCA"     Current Outpatient Prescriptions  Medication Sig Dispense Refill  . acetaminophen (TYLENOL  8 HOUR ARTHRITIS PAIN) 650 MG CR tablet Take 1,300 mg by mouth 2 (two) times daily as needed for pain (pain).    Marland Kitchen atorvastatin (LIPITOR) 40 MG tablet Take 40 mg by mouth daily.    . diclofenac sodium (VOLTAREN) 1 % GEL Take as needed    . fenofibrate 160 MG tablet TAKE 1 TABLET (160 MG TOTAL) BY MOUTH DAILY. 90 tablet 3  . fluticasone (FLONASE) 50 MCG/ACT nasal spray Place 2 sprays into the nose daily as needed for allergies.     Marland Kitchen lisinopril (PRINIVIL,ZESTRIL) 10 MG tablet Take 10 mg by mouth daily.  5  . metoprolol succinate (TOPROL-XL) 50 MG 24 hr tablet Take 3 tablets (150 mg total) by mouth daily. 270 tablet 3  . Multiple Vitamins-Minerals (MULTIVITAMINS THER. W/MINERALS) TABS Take 1 tablet by mouth daily.      . nitroGLYCERIN (NITROSTAT) 0.4 MG SL tablet Place 0.4 mg under the tongue every 5 (five) minutes as needed for chest pain.    Marland Kitchen NU-IRON 150 MG capsule Take 150 mg by mouth daily.  5  . omeprazole (PRILOSEC) 10 MG capsule Take 1 capsule (10 mg total) by mouth daily. 90 capsule 1  . Tamsulosin HCl (FLOMAX) 0.4 MG CAPS Take 0.4 mg by mouth daily.      Alveda Reasons 20 MG TABS tablet TAKE 1 TABLET BY MOUTH EVERY DAY 30 tablet 6   No current facility-administered medications for this visit.    Allergies:   Codeine; Monopril; and Ampicillin    Social History:  The patient  reports that he quit smoking about 12 years ago. His smoking use included Cigarettes. He quit after 20 years of use. He has never used smokeless tobacco. He reports that he does not drink alcohol or use illicit drugs.   Family History:  The patient's family history includes Heart attack in his father; Heart disease in his father; Hypertension in his mother.    ROS:  Please see the history of present illness.    Review of Systems: Constitutional:  denies fever, chills, diaphoresis, appetite change and fatigue.  HEENT: denies photophobia, eye pain, redness, hearing loss, ear pain, congestion, sore throat,  rhinorrhea, sneezing, neck pain, neck stiffness and tinnitus.  Respiratory: denies SOB, DOE, cough, chest tightness, and wheezing.  Cardiovascular: denies chest pain, palpitations and leg swelling.  Gastrointestinal: denies nausea, vomiting, abdominal pain, diarrhea, constipation, blood in stool.  Genitourinary: denies dysuria, urgency, frequency, hematuria, flank pain and difficulty urinating.  Musculoskeletal: denies  myalgias, back pain, joint swelling, arthralgias and gait problem.   Skin: denies pallor, rash and wound.  Neurological: denies dizziness, seizures, syncope, weakness, light-headedness, numbness and headaches.   Hematological: denies adenopathy, easy bruising, personal or family bleeding history.  Psychiatric/ Behavioral: denies suicidal ideation, mood changes, confusion, nervousness, sleep disturbance and agitation.       All other systems are reviewed and negative.    PHYSICAL EXAM: VS:  BP 122/72 mmHg  Pulse 58  Ht '6\' 2"'$  (1.88 m)  Wt 270 lb (122.471 kg)  BMI 34.65 kg/m2 , BMI Body mass index is 34.65 kg/(m^2). GEN: Well nourished, well developed, in no acute distress HEENT: normal Neck: no JVD, carotid bruits, or masses Cardiac: RRR; no murmurs, rubs, or gallops,no edema  Respiratory:  clear to auscultation bilaterally, normal work of breathing GI: soft, nontender, nondistended, + BS MS: no deformity or atrophy Skin: warm and dry, no rash Neuro:  Strength and sensation are intact Psych: normal   EKG:  EKG is ordered today. The ekg ordered today demonstrates sinus brady at 58. No ST or T wave change   Recent Labs: No results found for requested labs within last 365 days.    Lipid Panel    Component Value Date/Time   CHOL 120 04/08/2014 0912   CHOL 108 01/05/2013 0748   TRIG 146.0 04/08/2014 0912   TRIG 84 01/05/2013 0748   HDL 30.40* 04/08/2014 0912   HDL 29* 01/05/2013 0748   CHOLHDL 4 04/08/2014 0912   VLDL 29.2 04/08/2014 0912   LDLCALC 60  04/08/2014 0912   LDLCALC 62 01/05/2013 0748      Wt Readings from Last 3 Encounters:  01/05/16 270 lb (122.471 kg)  10/10/15 281 lb (127.461 kg)  07/10/15 272 lb 12.8 oz (123.741 kg)      Other studies Reviewed: Additional studies/ records that were reviewed today include: . Review of the above records demonstrates:    ASSESSMENT AND PLAN:  1.  Coronary artery disease-status post PTCA and stenting of his right coronary artery - no angina, Encouraged him to exercise . Check complete medical profile and lipids today.  2. Hypertension - BP is ok  3. Hyperlipidemia - will check next office visit  4. 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 Atrial fibrillation - discovered after his stroke-  CHADS2VASC is   39 ( age, CAD, CVA, HTN, ) , Continue current dose of Xarelto  6. Fatigue:   May be due to his high dose of Toprol XL.  Will reduce dose  to 100 mg a day  He is currently on Xarelto.  Continue meds.   7. Iron deficiency:   Has hx of iron deficiency.   This may be causing some of his fatigue. We'll check regular labs today and also check a CBC and an iron panel.  Current medicines are reviewed at length with the patient today.  The patient does not have concerns regarding medicines.  The following changes have been made:  no change  Labs/ tests ordered today include:  No orders of the defined types were placed in this encounter.     Disposition:   FU with me in 6 months.     Signed, Orville Mena, Wonda Cheng, MD  01/05/2016 8:21 AM    [facility] [address], [place], [place]  [address] Phone: [phone]; Fax: [phone]

## 2016-01-05 NOTE — Patient Instructions (Signed)
Medication Instructions:  DECREASE Toprol to 100 mg daily   Labwork: TODAY - CBC, Iron panel, cholesterol, complete metabolic panel   Testing/Procedures: None Ordered   Follow-Up: Your physician wants you to follow-up in: 6 months with Dr. Acie Fredrickson.  You will receive a reminder letter in the mail two months in advance. If you don't receive a letter, please call our office to schedule the follow-up appointment.   If you need a refill on your cardiac medications before your next appointment, please call your pharmacy.   Thank you for choosing [facility]! Christen Bame, RN [phone]

## 2016-01-06 ENCOUNTER — Ambulatory Visit: Payer: Medicare Other | Admitting: Cardiovascular Disease

## 2016-03-13 ENCOUNTER — Other Ambulatory Visit: Payer: Self-pay | Admitting: Cardiovascular Disease

## 2016-03-15 ENCOUNTER — Telehealth: Payer: Self-pay | Admitting: Cardiovascular Disease

## 2016-03-15 NOTE — Telephone Encounter (Signed)
New message     The pharmacist is asking to have the prescription Xarelto sent back in.

## 2016-03-15 NOTE — Telephone Encounter (Signed)
Spoke with pharmacist and he stated that they dis receive the escribe today, but they could not see all of the information on the script. Verbal order given.

## 2016-04-12 ENCOUNTER — Other Ambulatory Visit: Payer: Self-pay | Admitting: Cardiovascular Disease

## 2016-04-29 ENCOUNTER — Telehealth: Payer: Self-pay | Admitting: Cardiovascular Disease

## 2016-04-29 NOTE — Telephone Encounter (Signed)
New message   [facility] is calling to let you know that she no longer needs the biopsy clearance. Please call.

## 2016-04-29 NOTE — Telephone Encounter (Signed)
Spoke with Corey [name] at [facility] who states prostate biopsy has been cancelled on patient. I advised I will shred the clearance document that was faxed.  She thanked me for calling back.

## 2016-05-02 ENCOUNTER — Other Ambulatory Visit: Payer: Self-pay | Admitting: Cardiovascular Disease

## 2016-05-31 ENCOUNTER — Encounter (HOSPITAL_COMMUNITY): Payer: Self-pay | Admitting: Emergency Medicine

## 2016-05-31 ENCOUNTER — Observation Stay (HOSPITAL_BASED_OUTPATIENT_CLINIC_OR_DEPARTMENT_OTHER)
Admission: EM | Admit: 2016-05-31 | Discharge: 2016-06-02 | Disposition: A | Discharge status: Home / Self Care | Payer: Medicare Other | Source: Home / Self Care | Attending: Emergency Medicine | Admitting: Emergency Medicine

## 2016-05-31 ENCOUNTER — Emergency Department (HOSPITAL_COMMUNITY): Payer: Medicare Other

## 2016-05-31 DIAGNOSIS — I2510 Atherosclerotic heart disease of native coronary artery without angina pectoris: Secondary | ICD-10-CM | POA: Diagnosis not present

## 2016-05-31 DIAGNOSIS — Z96652 Presence of left artificial knee joint: Secondary | ICD-10-CM | POA: Insufficient documentation

## 2016-05-31 DIAGNOSIS — K5732 Diverticulitis of large intestine without perforation or abscess without bleeding: Principal | ICD-10-CM

## 2016-05-31 DIAGNOSIS — Z7901 Long term (current) use of anticoagulants: Secondary | ICD-10-CM | POA: Insufficient documentation

## 2016-05-31 DIAGNOSIS — I4891 Unspecified atrial fibrillation: Secondary | ICD-10-CM | POA: Insufficient documentation

## 2016-05-31 DIAGNOSIS — Z9989 Dependence on other enabling machines and devices: Secondary | ICD-10-CM | POA: Diagnosis present

## 2016-05-31 DIAGNOSIS — I10 Essential (primary) hypertension: Secondary | ICD-10-CM | POA: Diagnosis not present

## 2016-05-31 DIAGNOSIS — G4733 Obstructive sleep apnea (adult) (pediatric): Secondary | ICD-10-CM

## 2016-05-31 DIAGNOSIS — K5792 Diverticulitis of intestine, part unspecified, without perforation or abscess without bleeding: Secondary | ICD-10-CM | POA: Insufficient documentation

## 2016-05-31 DIAGNOSIS — I480 Paroxysmal atrial fibrillation: Secondary | ICD-10-CM | POA: Diagnosis not present

## 2016-05-31 DIAGNOSIS — E785 Hyperlipidemia, unspecified: Secondary | ICD-10-CM

## 2016-05-31 HISTORY — DX: Low back pain, unspecified: M54.50

## 2016-05-31 HISTORY — DX: Low back pain: M54.5

## 2016-05-31 HISTORY — DX: Other chronic pain: G89.29

## 2016-05-31 LAB — COMPREHENSIVE METABOLIC PANEL
ALT: 35 U/L (ref 17–63)
AST: 24 U/L (ref 15–41)
Albumin: 4.3 g/dL (ref 3.5–5.0)
Alkaline Phosphatase: 43 U/L (ref 38–126)
Anion gap: 9 (ref 5–15)
BUN: 29 mg/dL — ABNORMAL HIGH (ref 6–20)
CO2: 22 mmol/L (ref 22–32)
Calcium: 9.2 mg/dL (ref 8.9–10.3)
Chloride: 101 mmol/L (ref 101–111)
Creatinine, Ser: 1.28 mg/dL — ABNORMAL HIGH (ref 0.61–1.24)
GFR calc Af Amer: >60 mL/min (ref >60)
GFR calc non Af Amer: 56 mL/min — ABNORMAL LOW (ref >60)
Glucose, Bld: 111 mg/dL — ABNORMAL HIGH (ref 65–99)
Potassium: 4.3 mmol/L (ref 3.5–5.1)
Sodium: 132 mmol/L — ABNORMAL LOW (ref 135–145)
Total Bilirubin: 1.4 mg/dL — ABNORMAL HIGH (ref 0.3–1.2)
Total Protein: 7.2 g/dL (ref 6.5–8.1)

## 2016-05-31 LAB — CBC
HCT: 45 % (ref 39.0–52.0)
Hemoglobin: 14.9 g/dL (ref 13.0–17.0)
MCH: 30.8 pg (ref 26.0–34.0)
MCHC: 33.1 g/dL (ref 30.0–36.0)
MCV: 93.2 fL (ref 78.0–100.0)
Platelets: 259 10*3/uL (ref 150–400)
RBC: 4.83 MIL/uL (ref 4.22–5.81)
RDW: 14 % (ref 11.5–15.5)
WBC: 12 10*3/uL — ABNORMAL HIGH (ref 4.0–10.5)

## 2016-05-31 LAB — URINALYSIS, ROUTINE W REFLEX MICROSCOPIC
Glucose, UA: NEGATIVE mg/dL
Hgb urine dipstick: NEGATIVE
Ketones, ur: NEGATIVE mg/dL
Nitrite: NEGATIVE
Protein, ur: NEGATIVE mg/dL
Specific Gravity, Urine: 1.028 (ref 1.005–1.030)
pH: 5 (ref 5.0–8.0)

## 2016-05-31 LAB — URINE MICROSCOPIC-ADD ON: RBC / HPF: NONE SEEN RBC/hpf (ref 0–5)

## 2016-05-31 LAB — LIPASE, BLOOD: Lipase: 26 U/L (ref 11–51)

## 2016-05-31 LAB — PROTIME-INR
INR: 1.83
Prothrombin Time: 21.4 seconds — ABNORMAL HIGH (ref 11.4–15.2)

## 2016-05-31 MED ORDER — ACETAMINOPHEN 325 MG PO TABS: 650.0000 mg | ORAL_TABLET | Freq: Four times a day (QID) | ORAL | Status: DC | PRN

## 2016-05-31 MED ORDER — SODIUM CHLORIDE 0.9 % IV BOLUS (SEPSIS)
1000.0000 mL | Freq: Once | INTRAVENOUS | Status: AC
  Administered 2016-05-31: 1000 mL via INTRAVENOUS

## 2016-05-31 MED ORDER — HYDROCODONE-ACETAMINOPHEN 5-325 MG PO TABS
1.0000 | ORAL_TABLET | ORAL | Status: DC | PRN
  Filled 2016-05-31: qty 2

## 2016-05-31 MED ORDER — IOPAMIDOL (ISOVUE-300) INJECTION 61%
INTRAVENOUS | Status: AC
  Filled 2016-05-31: qty 100

## 2016-05-31 MED ORDER — ATORVASTATIN CALCIUM 40 MG PO TABS
40.0000 mg | ORAL_TABLET | Freq: Every day | ORAL | Status: DC
  Administered 2016-06-01: 40 mg via ORAL
  Filled 2016-05-31: qty 1

## 2016-05-31 MED ORDER — IOPAMIDOL (ISOVUE-300) INJECTION 61%
100.0000 mL | Freq: Once | INTRAVENOUS | Status: AC | PRN
Start: 2016-05-31 — End: 2016-05-31
  Administered 2016-05-31: 100 mL via INTRAVENOUS

## 2016-05-31 MED ORDER — PANTOPRAZOLE SODIUM 40 MG PO TBEC
40.0000 mg | DELAYED_RELEASE_TABLET | Freq: Every day | ORAL | Status: DC
Start: 2016-06-01 — End: 2016-06-02
  Administered 2016-06-01 – 2016-06-02 (×2): 40 mg via ORAL
  Filled 2016-05-31 (×2): qty 1

## 2016-05-31 MED ORDER — FENTANYL CITRATE (PF) 100 MCG/2ML IJ SOLN
50.0000 ug | Freq: Once | INTRAMUSCULAR | Status: AC
  Administered 2016-05-31: 50 ug via INTRAVENOUS
  Filled 2016-05-31: qty 2

## 2016-05-31 MED ORDER — METRONIDAZOLE IN NACL 5-0.79 MG/ML-% IV SOLN
500.0000 mg | Freq: Three times a day (TID) | INTRAVENOUS | Status: DC
  Administered 2016-06-01 – 2016-06-02 (×4): 500 mg via INTRAVENOUS
  Filled 2016-05-31 (×4): qty 100

## 2016-05-31 MED ORDER — RIVAROXABAN 20 MG PO TABS
20.0000 mg | ORAL_TABLET | Freq: Every day | ORAL | Status: DC
  Administered 2016-05-31 – 2016-06-02 (×3): 20 mg via ORAL
  Filled 2016-05-31 (×3): qty 1

## 2016-05-31 MED ORDER — DEXTROSE 5 % IV SOLN
2.0000 g | Freq: Once | INTRAVENOUS | Status: AC
  Administered 2016-05-31: 2 g via INTRAVENOUS
  Filled 2016-05-31: qty 2

## 2016-05-31 MED ORDER — ONDANSETRON HCL 4 MG PO TABS: 4.0000 mg | ORAL_TABLET | Freq: Four times a day (QID) | ORAL | Status: DC | PRN

## 2016-05-31 MED ORDER — TAMSULOSIN HCL 0.4 MG PO CAPS
0.4000 mg | ORAL_CAPSULE | Freq: Every day | ORAL | Status: DC
  Administered 2016-05-31 – 2016-06-02 (×3): 0.4 mg via ORAL
  Filled 2016-05-31 (×3): qty 1

## 2016-05-31 MED ORDER — ONDANSETRON HCL 4 MG/2ML IJ SOLN
4.0000 mg | Freq: Once | INTRAMUSCULAR | Status: AC
  Administered 2016-05-31: 4 mg via INTRAVENOUS
  Filled 2016-05-31: qty 2

## 2016-05-31 MED ORDER — ONDANSETRON 4 MG PO TBDP
4.0000 mg | ORAL_TABLET | Freq: Once | ORAL | Status: AC | PRN
  Administered 2016-05-31: 4 mg via ORAL

## 2016-05-31 MED ORDER — METRONIDAZOLE IN NACL 5-0.79 MG/ML-% IV SOLN
500.0000 mg | Freq: Once | INTRAVENOUS | Status: AC
  Administered 2016-05-31: 500 mg via INTRAVENOUS
  Filled 2016-05-31: qty 100

## 2016-05-31 MED ORDER — MORPHINE SULFATE (PF) 2 MG/ML IV SOLN
2.0000 mg | INTRAVENOUS | Status: DC | PRN
  Administered 2016-05-31 – 2016-06-01 (×5): 2 mg via INTRAVENOUS
  Filled 2016-05-31 (×5): qty 1

## 2016-05-31 MED ORDER — METOPROLOL SUCCINATE ER 50 MG PO TB24
50.0000 mg | ORAL_TABLET | Freq: Every day | ORAL | Status: DC
  Administered 2016-05-31 – 2016-06-01 (×2): 50 mg via ORAL
  Filled 2016-05-31 (×2): qty 1

## 2016-05-31 MED ORDER — CIPROFLOXACIN IN D5W 400 MG/200ML IV SOLN
400.0000 mg | Freq: Two times a day (BID) | INTRAVENOUS | Status: DC
  Administered 2016-05-31 – 2016-06-02 (×4): 400 mg via INTRAVENOUS
  Filled 2016-05-31 (×4): qty 200

## 2016-05-31 MED ORDER — ACETAMINOPHEN 650 MG RE SUPP: 650.0000 mg | Freq: Four times a day (QID) | RECTAL | Status: DC | PRN

## 2016-05-31 MED ORDER — DICYCLOMINE HCL 10 MG PO CAPS
10.0000 mg | ORAL_CAPSULE | Freq: Once | ORAL | Status: AC
  Administered 2016-05-31: 10 mg via ORAL
  Filled 2016-05-31: qty 1

## 2016-05-31 MED ORDER — MORPHINE SULFATE (PF) 4 MG/ML IV SOLN
4.0000 mg | Freq: Once | INTRAVENOUS | Status: AC
  Administered 2016-05-31: 4 mg via INTRAVENOUS
  Filled 2016-05-31: qty 1

## 2016-05-31 MED ORDER — SODIUM CHLORIDE 0.9 % IV BOLUS (SEPSIS)
1000.0000 mL | Freq: Once | INTRAVENOUS | Status: AC
Start: 2016-05-31 — End: 2016-05-31
  Administered 2016-05-31: 1000 mL via INTRAVENOUS

## 2016-05-31 MED ORDER — SODIUM CHLORIDE 0.9 % IV SOLN
INTRAVENOUS | Status: AC
  Administered 2016-05-31: 23:00:00 via INTRAVENOUS

## 2016-05-31 MED ORDER — ONDANSETRON HCL 4 MG/2ML IJ SOLN
4.0000 mg | Freq: Four times a day (QID) | INTRAMUSCULAR | Status: DC | PRN
Start: 2016-05-31 — End: 2016-06-02

## 2016-05-31 MED ORDER — TRAMADOL-ACETAMINOPHEN 37.5-325 MG PO TABS: 1.0000 | ORAL_TABLET | Freq: Four times a day (QID) | ORAL | Status: DC | PRN

## 2016-05-31 MED ORDER — ONDANSETRON 4 MG PO TBDP
ORAL_TABLET | ORAL | Status: AC
  Filled 2016-05-31: qty 1

## 2016-05-31 NOTE — ED Triage Notes (Signed)
Pt started having abd pain on Friday evening -- hx of diverticulitis -- saw regular dr. Today and sent here-- has been taking flagyl and cipro since Saturday night.  No diarrhea, has pain and nausea. Has been on clear liquids.

## 2016-05-31 NOTE — Progress Notes (Signed)
Patient has home CPAP.  Cord inspected with no frays noted.  Water chamber filled and machine verified operational.  Patient will call with any issues.

## 2016-05-31 NOTE — ED Notes (Signed)
Pt refused oral pain medications per pain protocol in waiting room. Pt states he is good til he gets to a regular room.

## 2016-05-31 NOTE — H&P (Signed)
Corey [name] PTW:[id number] DOB: Nov 06, 1945 DOA: 05/31/2016     PCP: [name], MD   Outpatient Specialists:   [facility] Patient coming from:   home Lives   With family    Chief Complaint: abdominal pain  HPI: Corey [name] is a 70 y.o. male with medical history significant of CAD, OSA on CPAP, HTN    Presented with days of abdominal pain has prior history of diverticulitis so his PCP today who was concerned about patient not getting better despite taking Flagyl and Cipro patient denies any diarrhea reports nausea and abdominal pain has been able to tolerate clearly quits but not food she was sent to emergency department to get a CT scan  Denies any fever NO BM for the past 3 days. Reports now have appetite but has not had before.  No blood in stool, he takes IRon and that makes his stools dark.  Regarding pertinent Chronic problems: Patient has history of atrial fibrillation on anticoagulation has known history of sleep apnea on CPAP   IN ER:  Temp (24hrs), Avg:98.2 F (36.8 C), Min:97.9 F (36.6 C), Max:98.4 F (36.9 C) Heart rate 72 blood pressure 132/81 Sodium 132 creatinine 1.28 which is up from baseline 0.99 lipase 26 WBC 12  CT abdomen showing acute uncomplicated diverticulitis Following Medications were ordered in ER: Medications  cefTRIAXone (ROCEPHIN) 2 g in dextrose 5 % 50 mL IVPB (not administered)    And  metroNIDAZOLE (FLAGYL) IVPB 500 mg (not administered)  ondansetron (ZOFRAN-ODT) disintegrating tablet 4 mg (4 mg Oral Given 05/31/16 1251)  sodium chloride 0.9 % bolus 1,000 mL (0 mLs Intravenous Stopped 05/31/16 1644)  fentaNYL (SUBLIMAZE) injection 50 mcg (50 mcg Intravenous Given 05/31/16 1518)  ondansetron (ZOFRAN) injection 4 mg (4 mg Intravenous Given 05/31/16 1519)  iopamidol (ISOVUE-300) 61 % injection 100 mL (100 mLs Intravenous Contrast Given 05/31/16 1621)  morphine 4 MG/ML injection 4 mg (4 mg Intravenous Given 05/31/16 1729)      Hospitalist was called for admission for Diverticulitis  Review of Systems:    Pertinent positives include: abdominal pain, nausea,  Constitutional:  No weight loss, night sweats, Fevers, chills, fatigue, weight loss  HEENT:  No headaches, Difficulty swallowing,Tooth/dental problems,Sore throat,  No sneezing, itching, ear ache, nasal congestion, post nasal drip,  Cardio-vascular:  No chest pain, Orthopnea, PND, anasarca, dizziness, palpitations.no Bilateral lower extremity swelling  GI:  No heartburn, indigestion,  vomiting, diarrhea, change in bowel habits, loss of appetite, melena, blood in stool, hematemesis Resp:  no shortness of breath at rest. No dyspnea on exertion, No excess mucus, no productive cough, No non-productive cough, No coughing up of blood.No change in color of mucus.No wheezing. Skin:  no rash or lesions. No jaundice GU:  no dysuria, change in color of urine, no urgency or frequency. No straining to urinate.  No flank pain.  Musculoskeletal:  No joint pain or no joint swelling. No decreased range of motion. No back pain.  Psych:  No change in mood or affect. No depression or anxiety. No memory loss.  Neuro: no localizing neurological complaints, no tingling, no weakness, no double vision, no gait abnormality, no slurred speech, no confusion  As per HPI otherwise 10 point review of systems negative.   Past Medical History: Past Medical History:  Diagnosis Date  . Arthritis    "back, right knee, neck" (12/04/2014)  . Atrial fibrillation with RVR ([place])   . Coronary atherosclerosis of native coronary artery  a. BMS to [facility] 2004 and 2007, otherwise mild nonobstructive disease. EF normal.  . CVA (cerebral vascular accident) ([place]) 07/2012   AF discovered after stroke; denies residual on 12/04/2014  . Dyslipidemia   . Essential hypertension, benign   . GERD (gastroesophageal reflux disease)   . Lumbar radiculopathy, chronic 02/04/2015   Right L5  . Obesity    . OSA on CPAP   . Paroxysmal atrial fibrillation ([place])    a. Discovered after stroke.  . Pneumonia 01/1996  . PONV (postoperative nausea and vomiting)   . Recurrent upper respiratory infection (URI)    Past Surgical History:  Procedure Laterality Date  . ANTERIOR CERVICAL DECOMP/DISCECTOMY FUSION  07/2001; 10/2002   "C5-6; C6-7; redo"  . COLONOSCOPY W/ POLYPECTOMY  02/2014  . CORONARY ANGIOPLASTY WITH STENT PLACEMENT  05/2003; 12/2005   "mid RCA; mid RCA"  . KNEE ARTHROSCOPY Left 10/2005  . KNEE ARTHROSCOPY W/ PARTIAL MEDIAL MENISCECTOMY Left 09/2005  . LUMBAR LAMINECTOMY/DECOMPRESSION MICRODISCECTOMY  03/2005   "L4-5"  . POSTERIOR LUMBAR FUSION  10/2003   L5-S1; "plates, screws"  . SHOULDER ARTHROSCOPY Right 08/2011   Debridement of labrum, arthroscopic distal clavicle excision  . SHOULDER OPEN ROTATOR CUFF REPAIR Left 07/2014  . TEE WITHOUT CARDIOVERSION  07/07/2012   Procedure: TRANSESOPHAGEAL ECHOCARDIOGRAM (TEE);  Surgeon: Fay Records, MD;  Location: [facility];  Service: Cardiovascular;  Laterality: N/A;  . TONSILLECTOMY AND ADENOIDECTOMY  ~ 1956  . TOTAL KNEE ARTHROPLASTY Left 10/2006     Social History:  Ambulatory   Independently     reports that he quit smoking about 13 years ago. His smoking use included Cigarettes. He quit after 20.00 years of use. He has never used smokeless tobacco. He reports that he does not drink alcohol or use drugs.  Allergies:   Allergies  Allergen Reactions  . Codeine Nausea Only and Other (See Comments)    Heavy amounts cause nausea  . Monopril [Fosinopril Sodium] Other (See Comments)    Muscle aches & pain  . Ampicillin Rash  . Percocet [Oxycodone-Acetaminophen] Nausea And Vomiting       Family History:   Family History  Problem Relation Age of Onset  . Hypertension Mother   . Heart disease Father   . Heart attack Father     Medications: Prior to Admission medications   Medication Sig Start Date End Date Taking?  Authorizing Provider  acetaminophen (TYLENOL 8 HOUR ARTHRITIS PAIN) 650 MG CR tablet Take 1,300 mg by mouth 2 (two) times daily as needed for pain (pain).   Yes Historical Provider, MD  atorvastatin (LIPITOR) 40 MG tablet TAKE 1 TABLET BY MOUTH EVERY DAY Patient taking differently: Take 40 mg by mouth every day 04/12/16  Yes Thayer Headings, MD  bisacodyl (DULCOLAX) 5 MG EC tablet Take 10 mg by mouth daily as needed for moderate constipation.   Yes Historical Provider, MD  ciprofloxacin (CIPRO) 500 MG tablet Take 500 mg by mouth 2 (two) times daily.   Yes Historical Provider, MD  fenofibrate 160 MG tablet TAKE 1 TABLET (160 MG TOTAL) BY MOUTH DAILY. 07/23/15  Yes Thayer Headings, MD  lisinopril (PRINIVIL,ZESTRIL) 10 MG tablet Take 10 mg by mouth daily. 12/17/14  Yes Historical Provider, MD  metoprolol succinate (TOPROL-XL) 50 MG 24 hr tablet Take 2 tablets (100 mg total) by mouth daily. 01/05/16  Yes Thayer Headings, MD  metroNIDAZOLE (FLAGYL) 500 MG tablet Take 500 mg by mouth 2 (two) times daily.   Yes  Historical Provider, MD  Multiple Vitamins-Minerals (MULTIVITAMINS THER. W/MINERALS) TABS Take 1 tablet by mouth daily.     Yes Historical Provider, MD  NU-IRON 150 MG capsule Take 150 mg by mouth daily. 02/24/15  Yes Historical Provider, MD  omeprazole (PRILOSEC) 10 MG capsule TAKE ONE CAPSULE BY MOUTH EVERY DAY Patient taking differently: Take 10 mg by mouth every day 05/03/16  Yes Thayer Headings, MD  Psyllium (METAMUCIL) 48.57 % POWD Take 1 each by mouth daily.   Yes Historical Provider, MD  Tamsulosin HCl (FLOMAX) 0.4 MG CAPS Take 0.4 mg by mouth daily.     Yes Historical Provider, MD  traMADol-acetaminophen (ULTRACET) 37.5-325 MG tablet Take 1 tablet by mouth every 6 (six) hours as needed for moderate pain.   Yes Historical Provider, MD  XARELTO 20 MG TABS tablet TAKE 1 TABLET BY MOUTH EVERY DAY Patient taking differently: Take 20 mg by mouth every day 03/15/16  Yes Thayer Headings, MD  fluticasone  [facility]) 50 MCG/ACT nasal spray Place 2 sprays into the nose daily as needed for allergies.     Historical Provider, MD  nitroGLYCERIN (NITROSTAT) 0.4 MG SL tablet Place 0.4 mg under the tongue every 5 (five) minutes as needed for chest pain.    Historical Provider, MD    Physical Exam: Patient Vitals for the past 24 hrs:  BP Temp Temp src Pulse Resp SpO2 Height Weight  05/31/16 1545 137/77 - - 68 - 96 % - -  05/31/16 1530 122/79 - - 67 - 96 % - -  05/31/16 1521 - - - - - 95 % - -  05/31/16 1419 118/70 98.4 F (36.9 C) Oral 63 16 97 % - -  05/31/16 1212 132/81 97.9 F (36.6 C) Oral 72 20 94 % 6' 1.5" (1.867 m) 120.2 kg (265 lb)    1. General:  in No Acute distress 2. Psychological: Alert and  Oriented 3. Head/ENT:    Dry Mucous Membranes                          Head Non traumatic, neck supple                          Normal Dentition 4. SKIN:  decreased Skin turgor,  Skin clean Dry and intact no rash 5. Heart: Regular rate and rhythm no  Murmur, Rub or gallop 6. Lungs:   no wheezes or crackles   7. Abdomen: Soft,  tender, Non distended 8. Lower extremities: no clubbing, cyanosis, or edema 9. Neurologically Grossly intact, moving all 4 extremities equally   10. MSK: Normal range of motion   body mass index is 34.49 kg/m.  Labs on Admission:   Labs on Admission: I have personally reviewed following labs and imaging studies  CBC:  Recent Labs Lab 05/31/16 1237  WBC 12.0*  HGB 14.9  HCT 45.0  MCV 93.2  PLT 992   Basic Metabolic Panel:  Recent Labs Lab 05/31/16 1237  NA 132*  K 4.3  CL 101  CO2 22  GLUCOSE 111*  BUN 29*  CREATININE 1.28*  CALCIUM 9.2   GFR: Estimated Creatinine Clearance: 75.5 mL/min (by C-G formula based on SCr of 1.28 mg/dL). Liver Function Tests:  Recent Labs Lab 05/31/16 1237  AST 24  ALT 35  ALKPHOS 43  BILITOT 1.4*  PROT 7.2  ALBUMIN 4.3    Recent Labs Lab 05/31/16 1237  LIPASE 26   No results for input(s): AMMONIA  in the last 168 hours. Coagulation Profile:  Recent Labs Lab 05/31/16 1512  INR 1.83   Cardiac Enzymes: No results for input(s): CKTOTAL, CKMB, CKMBINDEX, TROPONINI in the last 168 hours. BNP (last 3 results) No results for input(s): PROBNP in the last 8760 hours. HbA1C: No results for input(s): HGBA1C in the last 72 hours. CBG: No results for input(s): GLUCAP in the last 168 hours. Lipid Profile: No results for input(s): CHOL, HDL, LDLCALC, TRIG, CHOLHDL, LDLDIRECT in the last 72 hours. Thyroid Function Tests: No results for input(s): TSH, T4TOTAL, FREET4, T3FREE, THYROIDAB in the last 72 hours. Anemia Panel: No results for input(s): VITAMINB12, FOLATE, FERRITIN, TIBC, IRON, RETICCTPCT in the last 72 hours. Urine analysis:    Component Value Date/Time   COLORURINE AMBER (A) 05/31/2016 1505   APPEARANCEUR CLEAR 05/31/2016 1505   LABSPEC 1.028 05/31/2016 1505   PHURINE 5.0 05/31/2016 1505   GLUCOSEU NEGATIVE 05/31/2016 1505   HGBUR NEGATIVE 05/31/2016 1505   BILIRUBINUR SMALL (A) 05/31/2016 1505   KETONESUR NEGATIVE 05/31/2016 1505   PROTEINUR NEGATIVE 05/31/2016 1505   NITRITE NEGATIVE 05/31/2016 1505   LEUKOCYTESUR SMALL (A) 05/31/2016 1505   Sepsis Labs: '@LABRCNTIP'$ (procalcitonin:4,lacticidven:4) )No results found for this or any previous visit (from the past 240 hour(s)).     UA   no evidence of UTI     Lab Results  Component Value Date   HGBA1C 5.9 (H) 07/07/2012    Estimated Creatinine Clearance: 75.5 mL/min (by C-G formula based on SCr of 1.28 mg/dL).  BNP (last 3 results) No results for input(s): PROBNP in the last 8760 hours.   ECG REPORT Not obtained  Filed Weights   05/31/16 1212  Weight: 120.2 kg (265 lb)     Cultures: No results found for: SDES, SPECREQUEST, CULT, REPTSTATUS   Radiological Exams on Admission: Ct Abdomen Pelvis W Contrast  Addendum Date: 05/31/2016   ADDENDUM REPORT: 05/31/2016 18:30 ADDENDUM: Impression 1. Should read:  Acute uncomplicated diverticulitis involving the mid and upper sigmoid colon. Electronically Signed   By: Marijo Sanes M.D.   On: 05/31/2016 18:30   Result Date: 05/31/2016 CLINICAL DATA:  Abdominal pain since Friday evening. Prior history of diverticulitis. EXAM: CT ABDOMEN AND PELVIS WITH CONTRAST TECHNIQUE: Multidetector CT imaging of the abdomen and pelvis was performed using the standard protocol following bolus administration of intravenous contrast. CONTRAST:  1 ISOVUE-300 IOPAMIDOL (ISOVUE-300) INJECTION 61% COMPARISON:  CT scan 12/03/2013 FINDINGS: Lower chest: The lung bases are clear of acute process. Minimal dependent subpleural atelectasis. Linear scarring changes but no worrisome lung lesions 10 no pleural effusion. The heart is normal in size. No pericardial effusion. Three-vessel coronary artery calcifications are noted. The distal esophagus is grossly normal. Hepatobiliary: Diffuse and fairly marked fatty infiltration of the liver with a few areas of focal sparing. No worrisome hepatic lesions or intrahepatic biliary dilatation. The gallbladder is normal. No common bile duct dilatation. Pancreas: No mass, inflammation or ductal dilatation. Spleen: Normal size.  No focal lesions. Adrenals/Urinary Tract: The adrenal glands and kidneys are unremarkable. No renal, ureteral or bladder calculi or mass. Stomach/Bowel: The stomach, duodenum and small bowel are unremarkable. No inflammatory changes, mass lesions or obstructive findings. The terminal ileum is normal. There is moderate wall thickening and inflammation involving the mid sigmoid colon consistent with acute diverticulitis. No complicating features such as abscess or free air. Vascular/Lymphatic: Advanced atherosclerotic calcifications involving the aorta. 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 This was present on the prior examination also but appears  slightly larger. No dissection. The branch vessels are patent. Small scattered mesenteric and retroperitoneal lymph nodes but no mass or adenopathy. Other: The prostate gland and seminal vesicles are unremarkable. No pelvic mass or adenopathy. No free pelvic fluid collections. No inguinal mass, adenopathy or hernia. Musculoskeletal: No significant bony findings. L5-S1 fusion changes are noted. IMPRESSION: 1. Acute uncomplicated diverticulosis involving the mid and upper sigmoid colon. 2. Diffuse and fairly marked fatty infiltration of the liver. 3. Advanced atherosclerotic calcifications involving the coronary arteries and abdominal aorta and branch vessels. 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 Followup CT angiogram of the abdomen is suggested in 12 months. Electronically Signed: By: Marijo Sanes M.D. On: 05/31/2016 17:16    Chart has been reviewed    Assessment/Plan  70 y.o. male with medical history significant of CAD, OSA on CPAP, HTN admitted with diverticulitis  Present on Admission: . Diverticulitis  - continue the IV antibiotics Cipro and Flagyl, bowel rest, supportive management fluid resuscitation. No evidence of local perforation. This is second event for  the patient . Atrial fibrillation ([place]) continue Toprol but given soft blood pressures O decrease dose to 50 overnight and monitor continue anticoagulation CHA2DS2 vasc score 3 . Coronary artery disease stable currently symptomatic continue to monitor continue statin . Hypertension given increased creatinine will hold lisinopril rehydrate decreased dose of metoprolol . OSA (obstructive sleep apnea) CPAP Ordered    Other plan as per orders.  DVT prophylaxis:   Lovenox     Code Status:  FULL CODE care as per patient    Family Communication:   Family not at  Bedside    Disposition Plan:    To home once workup is complete and patient is  stable      Consults called: none     Admission status:    inpatient       Level of care   medical floor        I have spent a total of  56 min on this admission    Kadra Kohan 05/31/2016, 8:29 PM    [facility]  Pager [phone]   after 2 AM please page floor coverage PA If 7AM-7PM, please contact the day team taking care of the patient  [email or web address]  Password [email or web address]

## 2016-05-31 NOTE — ED Notes (Signed)
Attempted to call report x 1  

## 2016-05-31 NOTE — ED Provider Notes (Signed)
[facility] DEPT Provider Note   CSN: [id number] Arrival date & time: 05/31/16  1206     History   Chief Complaint Chief Complaint  Patient presents with  . Abdominal Pain  . Diverticulitis    HPI Corey [name] is a 70 y.o. male.  HPI Patient began having abdominal pain Friday.  Saw his primary care doctor for and they started him on Cipro and Flagyl.  Had recheck today and was sent here for further evaluation including possible CT scan of the abdomen because of increasing tenderness.  Patient states when he walks in his abdomen shakes it hurts.  He denies any fever or chills.  Has had some nausea but no vomiting.  No significant diarrhea.  Patient has history of atrial fibrillation and is on an anticoagulant. Past Medical History:  Diagnosis Date  . Arthritis    "back, right knee, hands, ankles, neck" (05/31/2016)  . Atrial fibrillation with RVR ([place])   . Chronic lower back pain   . Coronary atherosclerosis of native coronary artery    a. BMS to [facility] 2004 and 2007, otherwise mild nonobstructive disease. EF normal.  . CVA (cerebral vascular accident) ([place]) 07/2012   AF discovered after stroke; denies residual on 12/04/2014  . Dyslipidemia   . Essential hypertension, benign   . GERD (gastroesophageal reflux disease)   . Lumbar radiculopathy, chronic 02/04/2015   Right L5  . Obesity   . OSA on CPAP   . Paroxysmal atrial fibrillation ([place])    a. Discovered after stroke.  . Pneumonia 01/1996  . PONV (postoperative nausea and vomiting)   . Recurrent upper respiratory infection (URI)     Patient Active Problem List   Diagnosis Date Noted  . Diverticulitis 05/31/2016  . Iron deficiency 01/05/2016  . Lumbar radiculopathy, chronic 02/04/2015  . Atrial fibrillation with RVR ([place]) 12/03/2014  . PAC (premature atrial contraction) 04/05/2014  . OSA (obstructive sleep apnea) 03/27/2014  . Atrial fibrillation ([place]) 01/05/2013  . CVA (cerebral infarction) 07/06/2012  . Coronary  artery disease   . Hypertension   . Dyslipidemia   . Dyspnea   . Chest pain   . SOB (shortness of breath)   . GERD (gastroesophageal reflux disease)     Past Surgical History:  Procedure Laterality Date  . ANTERIOR CERVICAL DECOMP/DISCECTOMY FUSION  07/2001; 10/2002   "C5-6; C6-7; redo"  . BACK SURGERY    . CARPAL TUNNEL RELEASE Left 10/2015  . COLONOSCOPY W/ POLYPECTOMY  02/2014  . CORONARY ANGIOPLASTY WITH STENT PLACEMENT  05/2003; 12/2005   "mid RCA; mid RCA"  . JOINT REPLACEMENT    . KNEE ARTHROSCOPY Left 10/2005  . KNEE ARTHROSCOPY W/ PARTIAL MEDIAL MENISCECTOMY Left 09/2005  . LUMBAR LAMINECTOMY/DECOMPRESSION MICRODISCECTOMY  03/2005   "L4-5"  . POSTERIOR LUMBAR FUSION  10/2003   L5-S1; "plates, screws"  . SHOULDER ARTHROSCOPY Right 08/2011   Debridement of labrum, arthroscopic distal clavicle excision  . SHOULDER OPEN ROTATOR CUFF REPAIR Left 07/2014  . TEE WITHOUT CARDIOVERSION  07/07/2012   Procedure: TRANSESOPHAGEAL ECHOCARDIOGRAM (TEE);  Surgeon: Fay Records, MD;  Location: [facility];  Service: Cardiovascular;  Laterality: N/A;  . TONSILLECTOMY AND ADENOIDECTOMY  ~ 1956  . TOTAL KNEE ARTHROPLASTY Left 10/2006  . TRIGGER FINGER RELEASE Left 10/2015       Home Medications    Prior to Admission medications   Medication Sig Start Date End Date Taking? Authorizing Provider  acetaminophen (TYLENOL 8 HOUR ARTHRITIS PAIN) 650 MG CR tablet Take 1,300  mg by mouth 2 (two) times daily as needed for pain (pain).   Yes Historical Provider, MD  atorvastatin (LIPITOR) 40 MG tablet TAKE 1 TABLET BY MOUTH EVERY DAY Patient taking differently: Take 40 mg by mouth every day 04/12/16  Yes Thayer Headings, MD  bisacodyl (DULCOLAX) 5 MG EC tablet Take 10 mg by mouth daily as needed for moderate constipation.   Yes Historical Provider, MD  ciprofloxacin (CIPRO) 500 MG tablet Take 500 mg by mouth 2 (two) times daily.   Yes Historical Provider, MD  fenofibrate 160 MG tablet TAKE 1 TABLET  (160 MG TOTAL) BY MOUTH DAILY. 07/23/15  Yes Thayer Headings, MD  lisinopril (PRINIVIL,ZESTRIL) 10 MG tablet Take 10 mg by mouth daily. 12/17/14  Yes Historical Provider, MD  metoprolol succinate (TOPROL-XL) 50 MG 24 hr tablet Take 2 tablets (100 mg total) by mouth daily. 01/05/16  Yes Thayer Headings, MD  metroNIDAZOLE (FLAGYL) 500 MG tablet Take 500 mg by mouth 2 (two) times daily.   Yes Historical Provider, MD  Multiple Vitamins-Minerals (MULTIVITAMINS THER. W/MINERALS) TABS Take 1 tablet by mouth daily.     Yes Historical Provider, MD  NU-IRON 150 MG capsule Take 150 mg by mouth daily. 02/24/15  Yes Historical Provider, MD  omeprazole (PRILOSEC) 10 MG capsule TAKE ONE CAPSULE BY MOUTH EVERY DAY Patient taking differently: Take 10 mg by mouth every day 05/03/16  Yes Thayer Headings, MD  Psyllium (METAMUCIL) 48.57 % POWD Take 1 each by mouth daily.   Yes Historical Provider, MD  Tamsulosin HCl (FLOMAX) 0.4 MG CAPS Take 0.4 mg by mouth daily.     Yes Historical Provider, MD  traMADol-acetaminophen (ULTRACET) 37.5-325 MG tablet Take 1 tablet by mouth every 6 (six) hours as needed for moderate pain.   Yes Historical Provider, MD  XARELTO 20 MG TABS tablet TAKE 1 TABLET BY MOUTH EVERY DAY Patient taking differently: Take 20 mg by mouth every day 03/15/16  Yes Thayer Headings, MD  fluticasone [facility]) 50 MCG/ACT nasal spray Place 2 sprays into the nose daily as needed for allergies.     Historical Provider, MD  nitroGLYCERIN (NITROSTAT) 0.4 MG SL tablet Place 0.4 mg under the tongue every 5 (five) minutes as needed for chest pain.    Historical Provider, MD    Family History Family History  Problem Relation Age of Onset  . Hypertension Mother   . Heart disease Father   . Heart attack Father     Social History Social History  Substance Use Topics  . Smoking status: Former Smoker    Packs/day: 1.00    Years: 34.00    Types: Cigarettes    Quit date: 05/05/2003  . Smokeless tobacco: Never Used  .  Alcohol use No     Allergies   Codeine; Monopril [fosinopril sodium]; Ampicillin; and Percocet [oxycodone-acetaminophen]   Review of Systems Review of Systems  Constitutional: Negative for chills and fever.  Gastrointestinal: Positive for abdominal pain and nausea. Negative for vomiting.  All other systems reviewed and are negative.    Physical Exam Updated Vital Signs BP 132/63 (BP Location: Left Arm)   Pulse 67   Temp 99 F (37.2 C) (Oral)   Resp 20   Ht '6\' 1"'$  (1.854 m)   Wt 261 lb 0.4 oz (118.4 kg)   SpO2 93%   BMI 34.44 kg/m   Physical Exam  Constitutional: He is oriented to person, place, and time. He appears well-developed and well-nourished. No distress.  HENT:  Head: Normocephalic and atraumatic.  Eyes: Pupils are equal, round, and reactive to light.  Neck: Normal range of motion.  Cardiovascular: Normal rate and intact distal pulses.   Pulmonary/Chest: No respiratory distress.  Abdominal: Normal appearance. He exhibits no distension. There is tenderness in the right lower quadrant and left lower quadrant. There is rebound.    Musculoskeletal: Normal range of motion.  Neurological: He is alert and oriented to person, place, and time. No cranial nerve deficit.  Skin: Skin is warm and dry. No rash noted.  Psychiatric: He has a normal mood and affect. His behavior is normal.  Nursing note and vitals reviewed.    ED Treatments / Results  Labs (all labs ordered are listed, but only abnormal results are displayed) Labs Reviewed  COMPREHENSIVE METABOLIC PANEL - Abnormal; Notable for the following:       Result Value   Sodium 132 (*)    Glucose, Bld 111 (*)    BUN 29 (*)    Creatinine, Ser 1.28 (*)    Total Bilirubin 1.4 (*)    GFR calc non Af Amer 56 (*)    All other components within normal limits  CBC - Abnormal; Notable for the following:    WBC 12.0 (*)    All other components within normal limits  URINALYSIS, ROUTINE W REFLEX MICROSCOPIC (NOT AT  [facility]) - Abnormal; Notable for the following:    Color, Urine AMBER (*)    Bilirubin Urine SMALL (*)    Leukocytes, UA SMALL (*)    All other components within normal limits  PROTIME-INR - Abnormal; Notable for the following:    Prothrombin Time 21.4 (*)    All other components within normal limits  URINE MICROSCOPIC-ADD ON - Abnormal; Notable for the following:    Squamous Epithelial / LPF 0-5 (*)    Bacteria, UA RARE (*)    Casts HYALINE CASTS (*)    All other components within normal limits  COMPREHENSIVE METABOLIC PANEL - Abnormal; Notable for the following:    Glucose, Bld 104 (*)    Calcium 8.7 (*)    Total Protein 6.1 (*)    Alkaline Phosphatase 36 (*)    All other components within normal limits  CBC - Abnormal; Notable for the following:    WBC 10.6 (*)    All other components within normal limits  LIPASE, BLOOD  MAGNESIUM  PHOSPHORUS  TSH    EKG  EKG Interpretation None       Radiology Ct Abdomen Pelvis W Contrast  Addendum Date: 05/31/2016   ADDENDUM REPORT: 05/31/2016 18:30 ADDENDUM: Impression 1. Should read: Acute uncomplicated diverticulitis involving the mid and upper sigmoid colon. Electronically Signed   By: Marijo Sanes M.D.   On: 05/31/2016 18:30   Result Date: 05/31/2016 CLINICAL DATA:  Abdominal pain since Friday evening. Prior history of diverticulitis. EXAM: CT ABDOMEN AND PELVIS WITH CONTRAST TECHNIQUE: Multidetector CT imaging of the abdomen and pelvis was performed using the standard protocol following bolus administration of intravenous contrast. CONTRAST:  1 ISOVUE-300 IOPAMIDOL (ISOVUE-300) INJECTION 61% COMPARISON:  CT scan 12/03/2013 FINDINGS: Lower chest: The lung bases are clear of acute process. Minimal dependent subpleural atelectasis. Linear scarring changes but no worrisome lung lesions 10 no pleural effusion. The heart is normal in size. No pericardial effusion. Three-vessel coronary artery calcifications are noted. The distal esophagus  is grossly normal. Hepatobiliary: Diffuse and fairly marked fatty infiltration of the liver with a few areas of focal sparing. No  worrisome hepatic lesions or intrahepatic biliary dilatation. The gallbladder is normal. No common bile duct dilatation. Pancreas: No mass, inflammation or ductal dilatation. Spleen: Normal size.  No focal lesions. Adrenals/Urinary Tract: The adrenal glands and kidneys are unremarkable. No renal, ureteral or bladder calculi or mass. Stomach/Bowel: The stomach, duodenum and small bowel are unremarkable. No inflammatory changes, mass lesions or obstructive findings. The terminal ileum is normal. There is moderate wall thickening and inflammation involving the mid sigmoid colon consistent with acute diverticulitis. No complicating features such as abscess or free air. Vascular/Lymphatic: Advanced atherosclerotic calcifications involving the aorta. 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 This was present on the prior examination also but appears slightly larger. No dissection. The branch vessels are patent. Small scattered mesenteric and retroperitoneal lymph nodes but no mass or adenopathy. Other: The prostate gland and seminal vesicles are unremarkable. No pelvic mass or adenopathy. No free pelvic fluid collections. No inguinal mass, adenopathy or hernia. Musculoskeletal: No significant bony findings. L5-S1 fusion changes are noted. IMPRESSION: 1. Acute uncomplicated diverticulosis involving the mid and upper sigmoid colon. 2. Diffuse and fairly marked fatty infiltration of the liver. 3. Advanced atherosclerotic calcifications involving the coronary arteries and abdominal aorta and branch vessels. 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 Followup CT angiogram of the abdomen is suggested in 12 months. Electronically  Signed: By: Marijo Sanes M.D. On: 05/31/2016 17:16     CT Abd.   IMPRESSION: 1. Acute uncomplicated diverticulosis involving the mid and upper sigmoid colon. 2. Diffuse and fairly marked fatty infiltration of the liver. 3. Advanced atherosclerotic calcifications involving the coronary arteries and abdominal aorta and branch vessels. 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 Followup CT angiogram of the abdomen is suggested in 12 months.   Electronically Signed By: Marijo Sanes M.D. On: 05/31/2016 17:16 Procedures Procedures (including critical care time)  Medications Ordered in ED Medications  morphine 2 MG/ML injection 2 mg (2 mg Intravenous Given 06/01/16 0656)  pantoprazole (PROTONIX) EC tablet 40 mg (40 mg Oral Given 06/01/16 0904)  atorvastatin (LIPITOR) tablet 40 mg (not administered)  rivaroxaban (XARELTO) tablet 20 mg (20 mg Oral Given 06/01/16 0905)  metoprolol succinate (TOPROL-XL) 24 hr tablet 50 mg (50 mg Oral Given 06/01/16 0905)  tamsulosin (FLOMAX) capsule 0.4 mg (0.4 mg Oral Given 06/01/16 0905)  acetaminophen (TYLENOL) tablet 650 mg (not administered)    Or  acetaminophen (TYLENOL) suppository 650 mg (not administered)  HYDROcodone-acetaminophen (NORCO/VICODIN) 5-325 MG per tablet 1-2 tablet (not administered)  ondansetron (ZOFRAN) tablet 4 mg (not administered)    Or  ondansetron (ZOFRAN) injection 4 mg (not administered)  0.9 %  sodium chloride infusion ( Intravenous New Bag/Given 05/31/16 2310)  ciprofloxacin (CIPRO) IVPB 400 mg (400 mg Intravenous Given 06/01/16 0904)  metroNIDAZOLE (FLAGYL) IVPB 500 mg (500 mg Intravenous Given 06/01/16 0237)  ondansetron (ZOFRAN-ODT) disintegrating tablet 4 mg (4 mg Oral Given 05/31/16 1251)  sodium chloride 0.9 % bolus 1,000 mL (0 mLs Intravenous Stopped 05/31/16 1644)  fentaNYL (SUBLIMAZE) injection 50 mcg (50 mcg Intravenous Given  05/31/16 1518)  ondansetron (ZOFRAN) injection 4 mg (4 mg Intravenous Given 05/31/16 1519)  iopamidol (ISOVUE-300) 61 % injection 100 mL (100 mLs Intravenous Contrast Given 05/31/16 1621)  morphine 4 MG/ML injection 4 mg (4 mg Intravenous Given 05/31/16 1729)  cefTRIAXone (ROCEPHIN) 2 g in dextrose 5 % 50 mL IVPB (0 g Intravenous Stopped 05/31/16 1936)    And  metroNIDAZOLE (FLAGYL) IVPB 500 mg (500 mg Intravenous New Bag/Given 05/31/16 1858)  dicyclomine (BENTYL) capsule 10 mg (10 mg Oral Given 05/31/16 2009)  sodium chloride 0.9 % bolus 1,000 mL (1,000 mLs Intravenous Given 05/31/16 2145)     Initial Impression / Assessment and Plan / ED Course  I have reviewed the triage vital signs and the nursing notes.  Pertinent labs & imaging results that were available during my care of the patient were reviewed by me and considered in my medical decision making (see chart for details).  Clinical Course      Final Clinical Impressions(s) / ED Diagnoses   Final diagnoses:  Diverticulitis of intestine without perforation or abscess without bleeding    New Prescriptions Current Discharge Medication List       Leonard Schwartz, MD 06/01/16 1018

## 2016-05-31 NOTE — ED Provider Notes (Signed)
I received pt in signout from Dr. Audie Pinto. We were awaiting CT results. CT shows acute diverticulitis without evidence of abscess or perforation. Gave IV morphine for ongoing pain. Gave ceftriaxone and Flagyl IV. Because of patient's failure of outpatient management with oral antibiotics, discussed admission with [facility] hospitalist Dr. Roel Cluck and pt admitted for further care. Pt well appearing w/ reassuring VS at time of admission.   Sharlett Iles, MD 05/31/16 [phone]

## 2016-05-31 NOTE — ED Notes (Signed)
Pt returned from Ct. Placed back on monitor. Will continue to monitor.

## 2016-06-01 DIAGNOSIS — K5792 Diverticulitis of intestine, part unspecified, without perforation or abscess without bleeding: Secondary | ICD-10-CM | POA: Diagnosis not present

## 2016-06-01 DIAGNOSIS — I10 Essential (primary) hypertension: Secondary | ICD-10-CM | POA: Diagnosis not present

## 2016-06-01 LAB — CBC
HCT: 41.9 % (ref 39.0–52.0)
Hemoglobin: 13.6 g/dL (ref 13.0–17.0)
MCH: 30.8 pg (ref 26.0–34.0)
MCHC: 32.5 g/dL (ref 30.0–36.0)
MCV: 94.8 fL (ref 78.0–100.0)
Platelets: 202 10*3/uL (ref 150–400)
RBC: 4.42 MIL/uL (ref 4.22–5.81)
RDW: 14.2 % (ref 11.5–15.5)
WBC: 10.6 10*3/uL — ABNORMAL HIGH (ref 4.0–10.5)

## 2016-06-01 LAB — COMPREHENSIVE METABOLIC PANEL
ALT: 27 U/L (ref 17–63)
AST: 18 U/L (ref 15–41)
Albumin: 3.5 g/dL (ref 3.5–5.0)
Alkaline Phosphatase: 36 U/L — ABNORMAL LOW (ref 38–126)
Anion gap: 8 (ref 5–15)
BUN: 18 mg/dL (ref 6–20)
CO2: 26 mmol/L (ref 22–32)
Calcium: 8.7 mg/dL — ABNORMAL LOW (ref 8.9–10.3)
Chloride: 104 mmol/L (ref 101–111)
Creatinine, Ser: 1.01 mg/dL (ref 0.61–1.24)
GFR calc Af Amer: >60 mL/min (ref >60)
GFR calc non Af Amer: >60 mL/min (ref >60)
Glucose, Bld: 104 mg/dL — ABNORMAL HIGH (ref 65–99)
Potassium: 4.5 mmol/L (ref 3.5–5.1)
Sodium: 138 mmol/L (ref 135–145)
Total Bilirubin: 1.1 mg/dL (ref 0.3–1.2)
Total Protein: 6.1 g/dL — ABNORMAL LOW (ref 6.5–8.1)

## 2016-06-01 LAB — TSH: TSH: 1.162 u[IU]/mL (ref 0.350–4.500)

## 2016-06-01 LAB — PHOSPHORUS: Phosphorus: 3.1 mg/dL (ref 2.5–4.6)

## 2016-06-01 LAB — MAGNESIUM: Magnesium: 2 mg/dL (ref 1.7–2.4)

## 2016-06-01 MED ORDER — METOPROLOL SUCCINATE ER 100 MG PO TB24
100.0000 mg | ORAL_TABLET | Freq: Every day | ORAL | Status: DC
  Administered 2016-06-02: 100 mg via ORAL
  Filled 2016-06-01: qty 1

## 2016-06-01 MED ORDER — FENOFIBRATE 160 MG PO TABS
160.0000 mg | ORAL_TABLET | Freq: Every day | ORAL | Status: DC
  Administered 2016-06-01 – 2016-06-02 (×2): 160 mg via ORAL
  Filled 2016-06-01 (×2): qty 1

## 2016-06-01 MED ORDER — METOPROLOL SUCCINATE ER 50 MG PO TB24
50.0000 mg | ORAL_TABLET | Freq: Once | ORAL | Status: AC
  Administered 2016-06-01: 50 mg via ORAL
  Filled 2016-06-01: qty 1

## 2016-06-01 NOTE — Progress Notes (Signed)
PROGRESS NOTE  [name]  PYP:[id number] DOB: Nov 08, 1945 DOA: 05/31/2016 PCP: [name], MD  Outpatient Specialists: Cardiology, Dr. Acie Fredrickson Pulmonology, Dr. Braulio Conte  Brief Narrative: Corey [name] is a 70 y.o. male with a history of CAD, AFib, OSA on CPAP, and HTN who presented to the ED 8/28 for LLQ pain and nausea. He had been started on po cipro/flagyl 2 days prior for presumed diverticulitis (2nd episode) but was not improving on follow up so he was sent to ED for evaluation where CT abdomen confirmed uncomplicated diverticulitis. He was afebrile, appeared dehydrated but in no distress, WBC was 12k, Cr 1.28, and he was given rocephin x1 in ED and started back on ciprofloxacin and flagyl IV. He has tolerated po since admission. LBM 3 days PTA, thought to be due to poor po.   Assessment & Plan: Active Problems:   Coronary artery disease   Hypertension   Atrial fibrillation (HCC)   OSA (obstructive sleep apnea)   Diverticulitis  Acute uncomplicated diverticulitis: Seen in mid-upper sigmoid on CT abdomen without concern for perforation/phlegmon, admitted for failing outpatient po abx (x2 days). Only previous attack was years ago requiring 4 days in [place] hospital.  - Continue IV flagyl and cipro; I don't suspect insufficient antibiotic coverage was cause of slow improvement as outpatient. Likely transition to po 8/30.  - Continue IVF's, decrease as diet advances.   Atrial fibrillation: Chronic, stable, nonvalvular.  - Rate control: Restart home dose metoprolol  - Anticoagulation: Continue home xarelto for CHA2DS2-VASc score is 3.  HTN:  - Hold lisinopril given AKI - Metoprolol home dose as above  OSA: Chronic, stable, continue CPAP qHS.   CAD/hyperlipidemia: Chronic, stable. No chest pain.  - Anti-HTN as above and continue statin and fibrate  GERD: Chronic, stable. Continue formulary PPI.  DVT prophylaxis: Lovenox Code Status: Full Family Communication: No family at  bedside, plan to see wife when she arrives later today.  Disposition Plan: Discharge to home once stable on po abx, likely in 24-48 hrs.   Consultants:   None  Procedures:   None  Antimicrobials:  Rocephin IV x1 8/28  Cipro IV (8/28 >> )  Flagyl IV (8/28 >> )   Subjective: Pt reports no improvement in LLQ pain that is intermittent, severe, lasting several minutes, not changed with food. No fever, BM, emesis overnight.   Objective: Vitals:   05/31/16 2104 05/31/16 2120 06/01/16 0234 06/01/16 0534  BP:  115/69 122/64 132/63  Pulse: 68 69 72 67  Resp:  '20 18 20  '$ Temp:  98.9 F (37.2 C) 99.3 F (37.4 C) 99 F (37.2 C)  TempSrc:  Oral Oral Oral  SpO2: 96% 98% 98% 93%  Weight:  118.4 kg (261 lb 0.4 oz)    Height:  '6\' 1"'$  (1.854 m)      Intake/Output Summary (Last 24 hours) at 06/01/16 1025 Last data filed at 06/01/16 0750  Gross per 24 hour  Intake          1264.17 ml  Output             1420 ml  Net          -155.83 ml   Filed Weights   05/31/16 1212 05/31/16 2120  Weight: 120.2 kg (265 lb) 118.4 kg (261 lb 0.4 oz)    Examination: General exam: 70 y.o. male in no distress. HEENT: Mucous membranes are moist.  Respiratory system: Non-labored breathing room air. Clear to auscultation bilaterally.  Cardiovascular system: Regular  rate and rhythm. No murmur, rub, or gallop. No JVD, and no pedal edema. Gastrointestinal system: Abdomen soft, non-tender, non-distended, with normoactive bowel sounds. No organomegaly or masses felt. Central nervous system: Alert and oriented. No focal neurological deficits. Extremities: Warm, no deformities Skin: No rashes, lesions no ulcers Psychiatry: Judgement and insight appear normal. Mood & affect appropriate.   Data Reviewed: I have personally reviewed following labs and imaging studies  CBC:  Recent Labs Lab 05/31/16 1237 06/01/16 0537  WBC 12.0* 10.6*  HGB 14.9 13.6  HCT 45.0 41.9  MCV 93.2 94.8  PLT 259 818   Basic  Metabolic Panel:  Recent Labs Lab 05/31/16 1237 06/01/16 0537  NA 132* 138  K 4.3 4.5  CL 101 104  CO2 22 26  GLUCOSE 111* 104*  BUN 29* 18  CREATININE 1.28* 1.01  CALCIUM 9.2 8.7*  MG  --  2.0  PHOS  --  3.1   GFR: Estimated Creatinine Clearance: 94.4 mL/min (by C-G formula based on SCr of 1.01 mg/dL). Liver Function Tests:  Recent Labs Lab 05/31/16 1237 06/01/16 0537  AST 24 18  ALT 35 27  ALKPHOS 43 36*  BILITOT 1.4* 1.1  PROT 7.2 6.1*  ALBUMIN 4.3 3.5    Recent Labs Lab 05/31/16 1237  LIPASE 26   No results for input(s): AMMONIA in the last 168 hours. Coagulation Profile:  Recent Labs Lab 05/31/16 1512  INR 1.83   Cardiac Enzymes: No results for input(s): CKTOTAL, CKMB, CKMBINDEX, TROPONINI in the last 168 hours. BNP (last 3 results) No results for input(s): PROBNP in the last 8760 hours. HbA1C: No results for input(s): HGBA1C in the last 72 hours. CBG: No results for input(s): GLUCAP in the last 168 hours. Lipid Profile: No results for input(s): CHOL, HDL, LDLCALC, TRIG, CHOLHDL, LDLDIRECT in the last 72 hours. Thyroid Function Tests:  Recent Labs  06/01/16 0537  TSH 1.162   Anemia Panel: No results for input(s): VITAMINB12, FOLATE, FERRITIN, TIBC, IRON, RETICCTPCT in the last 72 hours. Urine analysis:    Component Value Date/Time   COLORURINE AMBER (A) 05/31/2016 1505   APPEARANCEUR CLEAR 05/31/2016 1505   LABSPEC 1.028 05/31/2016 1505   PHURINE 5.0 05/31/2016 1505   GLUCOSEU NEGATIVE 05/31/2016 1505   HGBUR NEGATIVE 05/31/2016 1505   BILIRUBINUR SMALL (A) 05/31/2016 1505   KETONESUR NEGATIVE 05/31/2016 1505   PROTEINUR NEGATIVE 05/31/2016 1505   NITRITE NEGATIVE 05/31/2016 1505   LEUKOCYTESUR SMALL (A) 05/31/2016 1505   Sepsis Labs: '@LABRCNTIP'$ (procalcitonin:4,lacticidven:4)  )No results found for this or any previous visit (from the past 240 hour(s)).   Radiology Studies: Ct Abdomen Pelvis W Contrast  Addendum Date:  05/31/2016   ADDENDUM REPORT: 05/31/2016 18:30 ADDENDUM: Impression 1. Should read: Acute uncomplicated diverticulitis involving the mid and upper sigmoid colon. Electronically Signed   By: Marijo Sanes M.D.   On: 05/31/2016 18:30   Result Date: 05/31/2016 CLINICAL DATA:  Abdominal pain since Friday evening. Prior history of diverticulitis. EXAM: CT ABDOMEN AND PELVIS WITH CONTRAST TECHNIQUE: Multidetector CT imaging of the abdomen and pelvis was performed using the standard protocol following bolus administration of intravenous contrast. CONTRAST:  1 ISOVUE-300 IOPAMIDOL (ISOVUE-300) INJECTION 61% COMPARISON:  CT scan 12/03/2013 FINDINGS: Lower chest: The lung bases are clear of acute process. Minimal dependent subpleural atelectasis. Linear scarring changes but no worrisome lung lesions 10 no pleural effusion. The heart is normal in size. No pericardial effusion. Three-vessel coronary artery calcifications are noted. The distal esophagus is grossly normal. Hepatobiliary:  Diffuse and fairly marked fatty infiltration of the liver with a few areas of focal sparing. No worrisome hepatic lesions or intrahepatic biliary dilatation. The gallbladder is normal. No common bile duct dilatation. Pancreas: No mass, inflammation or ductal dilatation. Spleen: Normal size.  No focal lesions. Adrenals/Urinary Tract: The adrenal glands and kidneys are unremarkable. No renal, ureteral or bladder calculi or mass. Stomach/Bowel: The stomach, duodenum and small bowel are unremarkable. No inflammatory changes, mass lesions or obstructive findings. The terminal ileum is normal. There is moderate wall thickening and inflammation involving the mid sigmoid colon consistent with acute diverticulitis. No complicating features such as abscess or free air. Vascular/Lymphatic: Advanced atherosclerotic calcifications involving the aorta. 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 This was present on the prior examination also but appears slightly larger. No dissection. The branch vessels are patent. Small scattered mesenteric and retroperitoneal lymph nodes but no mass or adenopathy. Other: The prostate gland and seminal vesicles are unremarkable. No pelvic mass or adenopathy. No free pelvic fluid collections. No inguinal mass, adenopathy or hernia. Musculoskeletal: No significant bony findings. L5-S1 fusion changes are noted. IMPRESSION: 1. Acute uncomplicated diverticulosis involving the mid and upper sigmoid colon. 2. Diffuse and fairly marked fatty infiltration of the liver. 3. Advanced atherosclerotic calcifications involving the coronary arteries and abdominal aorta and branch vessels. 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 Followup CT angiogram of the abdomen is suggested in 12 months. Electronically Signed: By: Marijo Sanes M.D. On: 05/31/2016 17:16    Scheduled Meds: . atorvastatin  40 mg Oral q1800  . ciprofloxacin  400 mg Intravenous Q12H  . metoprolol succinate  50 mg Oral Daily  . metronidazole  500 mg Intravenous Q8H  . pantoprazole  40 mg Oral Daily  . rivaroxaban  20 mg Oral Daily  . tamsulosin  0.4 mg Oral Daily   Continuous Infusions:    LOS: 1 day   Time spent: 25 minutes.  Vance Gather, MD [facility] Pager [phone]  If 7PM-7AM, please contact night-coverage [email or web address] Password [facility] 06/01/2016, 10:25 AM

## 2016-06-01 NOTE — Care Management Note (Signed)
Case Management Note  Patient Details  Name: Corey [name] MRN: [id number] Date of Birth: 11/25/1945  Subjective/Objective:                 Patient admitted from home with wife. Admitted with A fib and diverticultis. Failed PO course of Cipro and Flagyl (48 hours w/o relief) at home. Will transition to solid foods today as he continues IV Abx. If tolerates, likely DC in AM with no CM needs.    Action/Plan:  Will DC to home with wife as able to tolerate PO, no CM needs identified.  Expected Discharge Date:                  Expected Discharge Plan:  Home/Self Care  In-House Referral:  NA  Discharge planning Services  CM Consult  Post Acute Care Choice:  NA Choice offered to:  NA  DME Arranged:  N/A DME Agency:  NA  HH Arranged:  NA HH Agency:  NA  Status of Service:  Completed, signed off  If discussed at [place] of Stay Meetings, dates discussed:    Additional Comments:  Carles Collet, RN 06/01/2016, 10:51 AM

## 2016-06-02 DIAGNOSIS — I10 Essential (primary) hypertension: Secondary | ICD-10-CM

## 2016-06-02 DIAGNOSIS — K5792 Diverticulitis of intestine, part unspecified, without perforation or abscess without bleeding: Secondary | ICD-10-CM

## 2016-06-02 MED ORDER — CIPROFLOXACIN HCL 500 MG PO TABS: 500.0000 mg | ORAL_TABLET | Freq: Two times a day (BID) | ORAL | 0 refills | Status: DC

## 2016-06-02 MED ORDER — TRAMADOL-ACETAMINOPHEN 37.5-325 MG PO TABS: 1.0000 | ORAL_TABLET | Freq: Four times a day (QID) | ORAL | 0 refills | Status: DC | PRN

## 2016-06-02 MED ORDER — METRONIDAZOLE 500 MG PO TABS
500.0000 mg | ORAL_TABLET | Freq: Three times a day (TID) | ORAL | Status: DC
  Administered 2016-06-02: 500 mg via ORAL
  Filled 2016-06-02: qty 1

## 2016-06-02 MED ORDER — METRONIDAZOLE 500 MG PO TABS: 500.0000 mg | ORAL_TABLET | Freq: Three times a day (TID) | ORAL | 0 refills | Status: DC

## 2016-06-02 MED ORDER — CIPROFLOXACIN HCL 500 MG PO TABS: 500.0000 mg | ORAL_TABLET | Freq: Two times a day (BID) | ORAL | Status: DC

## 2016-06-02 NOTE — Progress Notes (Signed)
Patient discharged per orders. Patient able to ambulate, dress self, pack up room independently. Patient denies pain or discomfort. Patient's wife at the bedside for d/c teaching/instructions. Prescriptions given to patient. Follow up appointments, medications, prescriptions, home care discussed with time allowed for questions and concerns. Patient and his wife stated they had none. Patient left the unit via wheelchair with a volunteer. - Roselyn Reef Leanard Dimaio,RN

## 2016-06-02 NOTE — Discharge Summary (Signed)
Physician Discharge Summary  Corey [name] LNL:[id number] DOB: 01/19/1946 DOA: 05/31/2016  PCP: [name], MD  Admit date: 05/31/2016 Discharge date: 06/02/2016  Time spent: 35 minutes  Recommendations for Outpatient Follow-up:  1. Corey [name] was admitted for acute diverticulitis, discharged on Cipro/Flagyl x 6 days   Discharge Diagnoses:  Active Problems:   Coronary artery disease   Hypertension   Atrial fibrillation (HCC)   OSA (obstructive sleep apnea)   Diverticulitis   Discharge Condition: Stable  Diet recommendation: Regular diet   Filed Weights   05/31/16 1212 05/31/16 2120  Weight: 120.2 kg (265 lb) 118.4 kg (261 lb 0.4 oz)    History of present illness:  Corey [name] is a 70 y.o. male with medical history significant of CAD, OSA on CPAP, HTN Presented with days of abdominal pain has prior history of diverticulitis so his PCP today who was concerned about patient not getting better despite taking Flagyl and Cipro patient denies any diarrhea reports nausea and abdominal pain has been able to tolerate clearly quits but not food she was sent to emergency department to get a CT scan  Denies any fever NO BM for the past 3 days. Reports now have appetite but has not had before.  No blood in stool, he takes IRon and that makes his stools dark.  Regarding pertinent Chronic problems: Patient has history of atrial fibrillation on anticoagulation has known history of sleep apnea on CPAP  Hospital Course:  Corey [name] is a 70 y.o. male with a history of CAD, AFib, OSA on CPAP, and HTN who presented to the ED 8/28 for LLQ pain and nausea. He had been started on po cipro/flagyl 2 days prior for presumed diverticulitis (2nd episode) but was not improving on follow up so he was sent to ED for evaluation where CT abdomen confirmed uncomplicated diverticulitis. He was afebrile, appeared dehydrated but in no distress, WBC was 12k, Cr 1.28, and he was given rocephin x1 in ED and started back  on ciprofloxacin and flagyl IV. He has tolerated po since admission. LBM 3 days PTA, thought to be due to poor po. By 06/02/2016 he was doing much better, getting out of bed to chair, tolerating advancement of his diet. Feeling much better he thought he was ready for discharge. He was discharge to his home on 06/02/2016 in stable condition.   Discharge Exam: Vitals:   06/02/16 0247 06/02/16 0529  BP: 131/67 131/69  Pulse: 72 70  Resp: 18 18  Temp: 98.9 F (37.2 C) 99.1 F (37.3 C)    General exam: 70 y.o. male in no distress. HEENT: Mucous membranes are moist.  Respiratory system: Non-labored breathing room air. Clear to auscultation bilaterally.  Cardiovascular system: Regular rate and rhythm. No murmur, rub, or gallop. No JVD, and no pedal edema. Gastrointestinal system: Abdomen soft, non-tender, non-distended, with normoactive bowel sounds. No organomegaly or masses felt. Central nervous system: Alert and oriented. No focal neurological deficits. Extremities: Warm, no deformities Skin: No rashes, lesions no ulcers Psychiatry: Judgement and insight appear normal. Mood & affect appropriate.   Discharge Instructions   Discharge Instructions    Call MD for:    Complete by:  As directed   Call MD for:  difficulty breathing, headache or visual disturbances    Complete by:  As directed   Call MD for:  extreme fatigue    Complete by:  As directed   Call MD for:  hives    Complete by:  As  directed   Call MD for:  persistant dizziness or light-headedness    Complete by:  As directed   Call MD for:  persistant nausea and vomiting    Complete by:  As directed   Call MD for:  redness, tenderness, or signs of infection (pain, swelling, redness, odor or green/yellow discharge around incision site)    Complete by:  As directed   Call MD for:  severe uncontrolled pain    Complete by:  As directed   Call MD for:  temperature >100.4    Complete by:  As directed   Diet - low sodium heart healthy     Complete by:  As directed   Increase activity slowly    Complete by:  As directed     Current Discharge Medication List    CONTINUE these medications which have CHANGED   Details  ciprofloxacin (CIPRO) 500 MG tablet Take 1 tablet (500 mg total) by mouth 2 (two) times daily. Qty: 12 tablet, Refills: 0    metroNIDAZOLE (FLAGYL) 500 MG tablet Take 1 tablet (500 mg total) by mouth 3 (three) times daily. Qty: 18 tablet, Refills: 0    traMADol-acetaminophen (ULTRACET) 37.5-325 MG tablet Take 1 tablet by mouth every 6 (six) hours as needed for moderate pain. Qty: 10 tablet, Refills: 0      CONTINUE these medications which have NOT CHANGED   Details  acetaminophen (TYLENOL 8 HOUR ARTHRITIS PAIN) 650 MG CR tablet Take 1,300 mg by mouth 2 (two) times daily as needed for pain (pain).    atorvastatin (LIPITOR) 40 MG tablet TAKE 1 TABLET BY MOUTH EVERY DAY Qty: 90 tablet, Refills: 0    bisacodyl (DULCOLAX) 5 MG EC tablet Take 10 mg by mouth daily as needed for moderate constipation.    fenofibrate 160 MG tablet TAKE 1 TABLET (160 MG TOTAL) BY MOUTH DAILY. Qty: 90 tablet, Refills: 3    lisinopril (PRINIVIL,ZESTRIL) 10 MG tablet Take 10 mg by mouth daily. Refills: 5    metoprolol succinate (TOPROL-XL) 50 MG 24 hr tablet Take 2 tablets (100 mg total) by mouth daily. Qty: 180 tablet, Refills: 3    Multiple Vitamins-Minerals (MULTIVITAMINS THER. W/MINERALS) TABS Take 1 tablet by mouth daily.      NU-IRON 150 MG capsule Take 150 mg by mouth daily. Refills: 5    omeprazole (PRILOSEC) 10 MG capsule TAKE ONE CAPSULE BY MOUTH EVERY DAY Qty: 90 capsule, Refills: 2    Psyllium (METAMUCIL) 48.57 % POWD Take 1 each by mouth daily.    Tamsulosin HCl (FLOMAX) 0.4 MG CAPS Take 0.4 mg by mouth daily.      XARELTO 20 MG TABS tablet TAKE 1 TABLET BY MOUTH EVERY DAY Qty: 30 tablet, Refills: 11    nitroGLYCERIN (NITROSTAT) 0.4 MG SL tablet Place 0.4 mg under the tongue every 5 (five) minutes as  needed for chest pain.      STOP taking these medications     fluticasone (FLONASE) 50 MCG/ACT nasal spray        Allergies  Allergen Reactions  . Codeine Nausea Only and Other (See Comments)    Heavy amounts cause nausea  . Monopril [Fosinopril Sodium] Other (See Comments)    Muscle aches & pain  . Ampicillin Rash  . Percocet [Oxycodone-Acetaminophen] Nausea And Vomiting   Follow-up Information    KOIRALA,DIBAS, MD Follow up in 1 week(s).   Specialty:  Family Medicine Contact information: [address] [place] [address] [phone]  The results of significant diagnostics from this hospitalization (including imaging, microbiology, ancillary and laboratory) are listed below for reference.    Significant Diagnostic Studies: Ct Abdomen Pelvis W Contrast  Addendum Date: 05/31/2016   ADDENDUM REPORT: 05/31/2016 18:30 ADDENDUM: Impression 1. Should read: Acute uncomplicated diverticulitis involving the mid and upper sigmoid colon. Electronically Signed   By: Marijo Sanes M.D.   On: 05/31/2016 18:30   Result Date: 05/31/2016 CLINICAL DATA:  Abdominal pain since Friday evening. Prior history of diverticulitis. EXAM: CT ABDOMEN AND PELVIS WITH CONTRAST TECHNIQUE: Multidetector CT imaging of the abdomen and pelvis was performed using the standard protocol following bolus administration of intravenous contrast. CONTRAST:  1 ISOVUE-300 IOPAMIDOL (ISOVUE-300) INJECTION 61% COMPARISON:  CT scan 12/03/2013 FINDINGS: Lower chest: The lung bases are clear of acute process. Minimal dependent subpleural atelectasis. Linear scarring changes but no worrisome lung lesions 10 no pleural effusion. The heart is normal in size. No pericardial effusion. Three-vessel coronary artery calcifications are noted. The distal esophagus is grossly normal. Hepatobiliary: Diffuse and fairly marked fatty infiltration of the liver with a few areas of focal sparing. No worrisome  hepatic lesions or intrahepatic biliary dilatation. The gallbladder is normal. No common bile duct dilatation. Pancreas: No mass, inflammation or ductal dilatation. Spleen: Normal size.  No focal lesions. Adrenals/Urinary Tract: The adrenal glands and kidneys are unremarkable. No renal, ureteral or bladder calculi or mass. Stomach/Bowel: The stomach, duodenum and small bowel are unremarkable. No inflammatory changes, mass lesions or obstructive findings. The terminal ileum is normal. There is moderate wall thickening and inflammation involving the mid sigmoid colon consistent with acute diverticulitis. No complicating features such as abscess or free air. Vascular/Lymphatic: Advanced atherosclerotic calcifications involving the aorta. 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 This was present on the prior examination also but appears slightly larger. No dissection. The branch vessels are patent. Small scattered mesenteric and retroperitoneal lymph nodes but no mass or adenopathy. Other: The prostate gland and seminal vesicles are unremarkable. No pelvic mass or adenopathy. No free pelvic fluid collections. No inguinal mass, adenopathy or hernia. Musculoskeletal: No significant bony findings. L5-S1 fusion changes are noted. IMPRESSION: 1. Acute uncomplicated diverticulosis involving the mid and upper sigmoid colon. 2. Diffuse and fairly marked fatty infiltration of the liver. 3. Advanced atherosclerotic calcifications involving the coronary arteries and abdominal aorta and branch vessels. 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 Followup CT angiogram of the abdomen is suggested in 12 months. Electronically Signed: By: Marijo Sanes M.D. On: 05/31/2016 17:16    Microbiology: No results found for this or any previous visit (from the past 240  hour(s)).   Labs: Basic Metabolic Panel:  Recent Labs Lab 05/31/16 1237 06/01/16 0537  NA 132* 138  K 4.3 4.5  CL 101 104  CO2 22 26  GLUCOSE 111* 104*  BUN 29* 18  CREATININE 1.28* 1.01  CALCIUM 9.2 8.7*  MG  --  2.0  PHOS  --  3.1   Liver Function Tests:  Recent Labs Lab 05/31/16 1237 06/01/16 0537  AST 24 18  ALT 35 27  ALKPHOS 43 36*  BILITOT 1.4* 1.1  PROT 7.2 6.1*  ALBUMIN 4.3 3.5    Recent Labs Lab 05/31/16 1237  LIPASE 26   No results for input(s): AMMONIA in the last 168 hours. CBC:  Recent Labs Lab 05/31/16 1237 06/01/16  0537  WBC 12.0* 10.6*  HGB 14.9 13.6  HCT 45.0 41.9  MCV 93.2 94.8  PLT 259 202   Cardiac Enzymes: No results for input(s): CKTOTAL, CKMB, CKMBINDEX, TROPONINI in the last 168 hours. BNP: BNP (last 3 results) No results for input(s): BNP in the last 8760 hours.  ProBNP (last 3 results) No results for input(s): PROBNP in the last 8760 hours.  CBG: No results for input(s): GLUCAP in the last 168 hours.     Signed:  Kelvin Cellar MD.  [facility] 06/02/2016, 12:03 PM

## 2016-06-02 NOTE — Discharge Instructions (Signed)
Information on my medicine - XARELTO (Rivaroxaban)  This medication education was reviewed with me or my healthcare representative as part of my discharge preparation.  The pharmacist that spoke with me during my hospital stay was:  Patrina Levering, Student-PharmD  Why was Xarelto prescribed for you? Xarelto was prescribed for you to reduce the risk of a blood clot forming that can cause a stroke if you have a medical condition called atrial fibrillation (a type of irregular heartbeat).  What do you need to know about xarelto ? Take your Xarelto ONCE DAILY at the same time every day with your evening meal. If you have difficulty swallowing the tablet whole, you may crush it and mix in applesauce just prior to taking your dose.  Take Xarelto exactly as prescribed by your doctor and DO NOT stop taking Xarelto without talking to the doctor who prescribed the medication.  Stopping without other stroke prevention medication to take the place of Xarelto may increase your risk of developing a clot that causes a stroke.  Refill your prescription before you run out.  After discharge, you should have regular check-up appointments with your healthcare provider that is prescribing your Xarelto.  In the future your dose may need to be changed if your kidney function or weight changes by a significant amount.  What do you do if you miss a dose? If you are taking Xarelto ONCE DAILY and you miss a dose, take it as soon as you remember on the same day then continue your regularly scheduled once daily regimen the next day. Do not take two doses of Xarelto at the same time or on the same day.   Important Safety Information A possible side effect of Xarelto is bleeding. You should call your healthcare provider right away if you experience any of the following: ? Bleeding from an injury or your nose that does not stop. ? Unusual colored urine (red or dark brown) or unusual colored stools (red or  black). ? Unusual bruising for unknown reasons. ? A serious fall or if you hit your head (even if there is no bleeding).  Some medicines may interact with Xarelto and might increase your risk of bleeding while on Xarelto. To help avoid this, consult your healthcare provider or pharmacist prior to using any new prescription or non-prescription medications, including herbals, vitamins, non-steroidal anti-inflammatory drugs (NSAIDs) and supplements.  This website has more information on Xarelto: [email or web address]

## 2016-06-02 NOTE — Care Management CC44 (Signed)
Condition Code 44 Documentation Completed  Patient Details  Name: Corey [name] MRN: [id number] Date of Birth: 04-08-1946   Condition Code 44 given:  Yes Patient signature on Condition Code 44 notice:  Yes Documentation of 2 MD's agreement:  Yes Code 44 added to claim:  Yes    Carles Collet, RN 06/02/2016, 1:18 PM

## 2016-06-02 NOTE — Plan of Care (Signed)
Problem: Pain Managment: Goal: General experience of comfort will improve Outcome: Progressing Decrease request for pain med.

## 2016-06-03 ENCOUNTER — Telehealth: Payer: Self-pay | Admitting: Cardiovascular Disease

## 2016-06-03 NOTE — Telephone Encounter (Signed)
New message      Pt is calling b/c he was recently in the hospital and was taken off his Lisinopril and now he would like to know if it is ok to start back on the rx.  Please call.

## 2016-06-03 NOTE — Telephone Encounter (Signed)
Spoke with patient who states he was advised to stop lisinopril during his hospitalization due to elevated creatinine.  I reviewed his discharge instructions and do not see where the lisinopril was stopped.  On 8/28 patient had Cr of 1.28 and then on 8/29 Cr was 1.01.  In H&P on 8/28 by Dr. Roel Cluck, patient was advised to hold lisinopril and rehydrate.  He states he was also advised to lower the dose of metoprolol which he refused to do because he explained he wanted cardiologist to make that call.  I advised that since his creatinine improved after hydration and BP was WNL during hospitalization, to resume lisinopril.  I scheduled him for his 6 mo f/u with Dr. Acie Fredrickson on 9/15.  He verbalized understanding and agreement and thanked me for the call.

## 2016-06-04 ENCOUNTER — Inpatient Hospital Stay (HOSPITAL_COMMUNITY): Payer: Medicare Other

## 2016-06-04 ENCOUNTER — Inpatient Hospital Stay (HOSPITAL_COMMUNITY)
Admission: EM | Admit: 2016-06-04 | Discharge: 2016-06-10 | DRG: 392 | Disposition: A | Discharge status: Home / Self Care | Payer: Medicare Other | Attending: Internal Medicine | Admitting: Internal Medicine

## 2016-06-04 ENCOUNTER — Encounter (HOSPITAL_COMMUNITY): Payer: Self-pay

## 2016-06-04 DIAGNOSIS — Z981 Arthrodesis status: Secondary | ICD-10-CM

## 2016-06-04 DIAGNOSIS — I2510 Atherosclerotic heart disease of native coronary artery without angina pectoris: Secondary | ICD-10-CM | POA: Diagnosis present

## 2016-06-04 DIAGNOSIS — G4733 Obstructive sleep apnea (adult) (pediatric): Secondary | ICD-10-CM | POA: Diagnosis present

## 2016-06-04 DIAGNOSIS — E785 Hyperlipidemia, unspecified: Secondary | ICD-10-CM | POA: Diagnosis present

## 2016-06-04 DIAGNOSIS — K219 Gastro-esophageal reflux disease without esophagitis: Secondary | ICD-10-CM | POA: Diagnosis present

## 2016-06-04 DIAGNOSIS — Z87891 Personal history of nicotine dependence: Secondary | ICD-10-CM | POA: Diagnosis not present

## 2016-06-04 DIAGNOSIS — Z7901 Long term (current) use of anticoagulants: Secondary | ICD-10-CM

## 2016-06-04 DIAGNOSIS — I482 Chronic atrial fibrillation: Secondary | ICD-10-CM | POA: Diagnosis present

## 2016-06-04 DIAGNOSIS — I480 Paroxysmal atrial fibrillation: Secondary | ICD-10-CM | POA: Diagnosis present

## 2016-06-04 DIAGNOSIS — Z96652 Presence of left artificial knee joint: Secondary | ICD-10-CM | POA: Diagnosis present

## 2016-06-04 DIAGNOSIS — E871 Hypo-osmolality and hyponatremia: Secondary | ICD-10-CM

## 2016-06-04 DIAGNOSIS — I4891 Unspecified atrial fibrillation: Secondary | ICD-10-CM | POA: Diagnosis present

## 2016-06-04 DIAGNOSIS — Z86010 Personal history of colonic polyps: Secondary | ICD-10-CM

## 2016-06-04 DIAGNOSIS — K5792 Diverticulitis of intestine, part unspecified, without perforation or abscess without bleeding: Secondary | ICD-10-CM | POA: Diagnosis not present

## 2016-06-04 DIAGNOSIS — Z79899 Other long term (current) drug therapy: Secondary | ICD-10-CM

## 2016-06-04 DIAGNOSIS — N400 Enlarged prostate without lower urinary tract symptoms: Secondary | ICD-10-CM | POA: Diagnosis present

## 2016-06-04 DIAGNOSIS — I10 Essential (primary) hypertension: Secondary | ICD-10-CM | POA: Diagnosis present

## 2016-06-04 DIAGNOSIS — K5732 Diverticulitis of large intestine without perforation or abscess without bleeding: Secondary | ICD-10-CM | POA: Diagnosis present

## 2016-06-04 DIAGNOSIS — Z9989 Dependence on other enabling machines and devices: Secondary | ICD-10-CM | POA: Diagnosis present

## 2016-06-04 DIAGNOSIS — E861 Hypovolemia: Secondary | ICD-10-CM | POA: Diagnosis present

## 2016-06-04 DIAGNOSIS — R197 Diarrhea, unspecified: Secondary | ICD-10-CM | POA: Diagnosis not present

## 2016-06-04 DIAGNOSIS — R1032 Left lower quadrant pain: Secondary | ICD-10-CM | POA: Diagnosis present

## 2016-06-04 DIAGNOSIS — E669 Obesity, unspecified: Secondary | ICD-10-CM | POA: Diagnosis present

## 2016-06-04 DIAGNOSIS — Z955 Presence of coronary angioplasty implant and graft: Secondary | ICD-10-CM | POA: Diagnosis not present

## 2016-06-04 DIAGNOSIS — Z8673 Personal history of transient ischemic attack (TIA), and cerebral infarction without residual deficits: Secondary | ICD-10-CM | POA: Diagnosis not present

## 2016-06-04 DIAGNOSIS — Z6834 Body mass index (BMI) 34.0-34.9, adult: Secondary | ICD-10-CM | POA: Diagnosis not present

## 2016-06-04 HISTORY — DX: Diverticulitis of intestine, part unspecified, without perforation or abscess without bleeding: K57.92

## 2016-06-04 HISTORY — DX: Hypo-osmolality and hyponatremia: E87.1

## 2016-06-04 LAB — I-STAT CG4 LACTIC ACID, ED: Lactic Acid, Venous: 0.76 mmol/L (ref 0.5–1.9)

## 2016-06-04 LAB — COMPREHENSIVE METABOLIC PANEL
ALT: 22 U/L (ref 17–63)
AST: 22 U/L (ref 15–41)
Albumin: 3.3 g/dL — ABNORMAL LOW (ref 3.5–5.0)
Alkaline Phosphatase: 46 U/L (ref 38–126)
Anion gap: 9 (ref 5–15)
BUN: 11 mg/dL (ref 6–20)
CO2: 22 mmol/L (ref 22–32)
Calcium: 8.7 mg/dL — ABNORMAL LOW (ref 8.9–10.3)
Chloride: 98 mmol/L — ABNORMAL LOW (ref 101–111)
Creatinine, Ser: 1.04 mg/dL (ref 0.61–1.24)
GFR calc Af Amer: >60 mL/min (ref >60)
GFR calc non Af Amer: >60 mL/min (ref >60)
Glucose, Bld: 107 mg/dL — ABNORMAL HIGH (ref 65–99)
Potassium: 4 mmol/L (ref 3.5–5.1)
Sodium: 129 mmol/L — ABNORMAL LOW (ref 135–145)
Total Bilirubin: 0.9 mg/dL (ref 0.3–1.2)
Total Protein: 6.4 g/dL — ABNORMAL LOW (ref 6.5–8.1)

## 2016-06-04 LAB — URINALYSIS, ROUTINE W REFLEX MICROSCOPIC
Bilirubin Urine: NEGATIVE
Glucose, UA: NEGATIVE mg/dL
Hgb urine dipstick: NEGATIVE
Ketones, ur: NEGATIVE mg/dL
Nitrite: NEGATIVE
Protein, ur: NEGATIVE mg/dL
Specific Gravity, Urine: 1.019 (ref 1.005–1.030)
pH: 5.5 (ref 5.0–8.0)

## 2016-06-04 LAB — CBC WITH DIFFERENTIAL/PLATELET
Basophils Absolute: 0 10*3/uL (ref 0.0–0.1)
Basophils Relative: 0 %
Eosinophils Absolute: 0.1 10*3/uL (ref 0.0–0.7)
Eosinophils Relative: 1 %
HCT: 40.1 % (ref 39.0–52.0)
Hemoglobin: 13.5 g/dL (ref 13.0–17.0)
Lymphocytes Relative: 8 %
Lymphs Abs: 1.1 10*3/uL (ref 0.7–4.0)
MCH: 30.4 pg (ref 26.0–34.0)
MCHC: 33.7 g/dL (ref 30.0–36.0)
MCV: 90.3 fL (ref 78.0–100.0)
Monocytes Absolute: 1.8 10*3/uL — ABNORMAL HIGH (ref 0.1–1.0)
Monocytes Relative: 13 %
Neutro Abs: 10.6 10*3/uL — ABNORMAL HIGH (ref 1.7–7.7)
Neutrophils Relative %: 78 %
Platelets: 262 10*3/uL (ref 150–400)
RBC: 4.44 MIL/uL (ref 4.22–5.81)
RDW: 13.9 % (ref 11.5–15.5)
WBC: 13.6 10*3/uL — ABNORMAL HIGH (ref 4.0–10.5)

## 2016-06-04 LAB — URINE MICROSCOPIC-ADD ON: Bacteria, UA: NONE SEEN

## 2016-06-04 LAB — OSMOLALITY: Osmolality: 271 mOsm/kg — ABNORMAL LOW (ref 275–295)

## 2016-06-04 LAB — POC OCCULT BLOOD, ED: Fecal Occult Bld: NEGATIVE

## 2016-06-04 MED ORDER — METRONIDAZOLE IN NACL 5-0.79 MG/ML-% IV SOLN
500.0000 mg | Freq: Once | INTRAVENOUS | Status: AC
  Administered 2016-06-04: 500 mg via INTRAVENOUS
  Filled 2016-06-04: qty 100

## 2016-06-04 MED ORDER — ONDANSETRON HCL 4 MG/2ML IJ SOLN
4.0000 mg | Freq: Four times a day (QID) | INTRAMUSCULAR | Status: DC | PRN
  Administered 2016-06-04: 4 mg via INTRAVENOUS
  Filled 2016-06-04: qty 2

## 2016-06-04 MED ORDER — ATORVASTATIN CALCIUM 40 MG PO TABS
40.0000 mg | ORAL_TABLET | Freq: Every day | ORAL | Status: DC
  Administered 2016-06-05 – 2016-06-10 (×6): 40 mg via ORAL
  Filled 2016-06-04 (×6): qty 1

## 2016-06-04 MED ORDER — METRONIDAZOLE IN NACL 5-0.79 MG/ML-% IV SOLN
500.0000 mg | Freq: Three times a day (TID) | INTRAVENOUS | Status: DC
  Administered 2016-06-04 – 2016-06-06 (×5): 500 mg via INTRAVENOUS
  Filled 2016-06-04 (×7): qty 100

## 2016-06-04 MED ORDER — MORPHINE SULFATE (PF) 4 MG/ML IV SOLN
4.0000 mg | Freq: Once | INTRAVENOUS | Status: AC
  Administered 2016-06-04: 4 mg via INTRAVENOUS
  Filled 2016-06-04: qty 1

## 2016-06-04 MED ORDER — METOPROLOL SUCCINATE ER 100 MG PO TB24
100.0000 mg | ORAL_TABLET | Freq: Every day | ORAL | Status: DC
  Administered 2016-06-04 – 2016-06-10 (×7): 100 mg via ORAL
  Filled 2016-06-04 (×7): qty 1

## 2016-06-04 MED ORDER — ONDANSETRON 4 MG PO TBDP
ORAL_TABLET | ORAL | Status: AC
Start: 2016-06-04 — End: 2016-06-05
  Filled 2016-06-04: qty 2

## 2016-06-04 MED ORDER — CEFTRIAXONE SODIUM 1 G IJ SOLR
1.0000 g | INTRAMUSCULAR | Status: DC
  Administered 2016-06-04 – 2016-06-05 (×2): 1 g via INTRAVENOUS
  Filled 2016-06-04 (×3): qty 10

## 2016-06-04 MED ORDER — TAMSULOSIN HCL 0.4 MG PO CAPS
0.4000 mg | ORAL_CAPSULE | Freq: Every day | ORAL | Status: DC
  Administered 2016-06-04 – 2016-06-10 (×7): 0.4 mg via ORAL
  Filled 2016-06-04 (×7): qty 1

## 2016-06-04 MED ORDER — SODIUM CHLORIDE 0.9 % IV SOLN
INTRAVENOUS | Status: DC
  Administered 2016-06-04 – 2016-06-07 (×6): via INTRAVENOUS

## 2016-06-04 MED ORDER — RIVAROXABAN 20 MG PO TABS
20.0000 mg | ORAL_TABLET | Freq: Every day | ORAL | Status: DC
  Administered 2016-06-04 – 2016-06-09 (×6): 20 mg via ORAL
  Filled 2016-06-04 (×6): qty 1

## 2016-06-04 MED ORDER — CIPROFLOXACIN IN D5W 400 MG/200ML IV SOLN
400.0000 mg | Freq: Once | INTRAVENOUS | Status: AC
  Administered 2016-06-04: 400 mg via INTRAVENOUS
  Filled 2016-06-04: qty 200

## 2016-06-04 MED ORDER — PANTOPRAZOLE SODIUM 40 MG PO TBEC
40.0000 mg | DELAYED_RELEASE_TABLET | Freq: Every day | ORAL | Status: DC
  Administered 2016-06-05 – 2016-06-06 (×2): 40 mg via ORAL
  Filled 2016-06-04 (×2): qty 1

## 2016-06-04 MED ORDER — IOPAMIDOL (ISOVUE-300) INJECTION 61%
INTRAVENOUS | Status: AC
  Administered 2016-06-04: 22:00:00
  Filled 2016-06-04: qty 100

## 2016-06-04 MED ORDER — LISINOPRIL 10 MG PO TABS
10.0000 mg | ORAL_TABLET | Freq: Every day | ORAL | Status: DC
  Administered 2016-06-04 – 2016-06-10 (×7): 10 mg via ORAL
  Filled 2016-06-04 (×7): qty 1

## 2016-06-04 MED ORDER — MORPHINE SULFATE (PF) 2 MG/ML IV SOLN
2.0000 mg | INTRAVENOUS | Status: DC | PRN
  Administered 2016-06-04 – 2016-06-05 (×7): 2 mg via INTRAVENOUS
  Filled 2016-06-04 (×7): qty 1

## 2016-06-04 MED ORDER — ONDANSETRON 4 MG PO TBDP
8.0000 mg | ORAL_TABLET | Freq: Once | ORAL | Status: AC
  Administered 2016-06-04: 8 mg via ORAL

## 2016-06-04 MED ORDER — IOPAMIDOL (ISOVUE-300) INJECTION 61%
15.0000 mL | INTRAVENOUS | Status: AC
  Administered 2016-06-04 (×2): 15 mL via ORAL

## 2016-06-04 NOTE — ED Provider Notes (Signed)
[facility] DEPT Provider Note   CSN: [id number] Arrival date & time: 06/04/16  1236     History   Chief Complaint No chief complaint on file.   HPI Corey [name] is a 70 y.o. male.  HPI   70 year old male with history of A. Fib on Xarelto, hypertension, CAD, OSA on CPAP presenting with complaint of worsening abdominal pain. Patient initially presents to the ED on 8/28 for left lower quadrant abdominal pain with nausea. He was initially started on Cipro and Flagyl 2 days prior for presumed diverticulitis. He had a CT scan during that visit that did confirm uncomplicated diverticulitis. He was admitted to the hospital and was there for 2 days for IV antibiotic. He felt better and subsequently discharge on 8/30. He was sent home with prescription for Cipro and Flagyl for 6 days. He was prescribed tramadol for pain. Patient states shortly after he returned home, he spiked a fever, and his pain increases. He has been taking his medication but reports feeling nauseous, dry heaving for the past 2 days and having persistent low abdominal pain. He described his pain as a cramping burning sensation. Pain is persistent despite being on antibiotic. He has been taking Ultracet, and tramadol at home for his pain without adequate relief. Feels he needs to be readmitted again for further management of his condition. He denies any rectal bleeding. He is able to pass flatus and only small amount of bowel movement each time. He has not had a normal bowel movement for at least a week.     Past Medical History:  Diagnosis Date  . Arthritis    "back, right knee, hands, ankles, neck" (05/31/2016)  . Atrial fibrillation with RVR ([place])   . Chronic lower back pain   . Coronary atherosclerosis of native coronary artery    a. BMS to [facility] 2004 and 2007, otherwise mild nonobstructive disease. EF normal.  . CVA (cerebral vascular accident) ([place]) 07/2012   AF discovered after stroke; denies residual on 12/04/2014  .  Dyslipidemia   . Essential hypertension, benign   . GERD (gastroesophageal reflux disease)   . Lumbar radiculopathy, chronic 02/04/2015   Right L5  . Obesity   . OSA on CPAP   . Paroxysmal atrial fibrillation ([place])    a. Discovered after stroke.  . Pneumonia 01/1996  . PONV (postoperative nausea and vomiting)   . Recurrent upper respiratory infection (URI)     Patient Active Problem List   Diagnosis Date Noted  . Diverticulitis 05/31/2016  . Iron deficiency 01/05/2016  . Lumbar radiculopathy, chronic 02/04/2015  . Atrial fibrillation with RVR ([place]) 12/03/2014  . PAC (premature atrial contraction) 04/05/2014  . OSA (obstructive sleep apnea) 03/27/2014  . Atrial fibrillation ([place]) 01/05/2013  . CVA (cerebral infarction) 07/06/2012  . Coronary artery disease   . Hypertension   . Dyslipidemia   . Dyspnea   . Chest pain   . SOB (shortness of breath)   . GERD (gastroesophageal reflux disease)     Past Surgical History:  Procedure Laterality Date  . ANTERIOR CERVICAL DECOMP/DISCECTOMY FUSION  07/2001; 10/2002   "C5-6; C6-7; redo"  . BACK SURGERY    . CARPAL TUNNEL RELEASE Left 10/2015  . COLONOSCOPY W/ POLYPECTOMY  02/2014  . CORONARY ANGIOPLASTY WITH STENT PLACEMENT  05/2003; 12/2005   "mid RCA; mid RCA"  . JOINT REPLACEMENT    . KNEE ARTHROSCOPY Left 10/2005  . KNEE ARTHROSCOPY W/ PARTIAL MEDIAL MENISCECTOMY Left 09/2005  . LUMBAR LAMINECTOMY/DECOMPRESSION  MICRODISCECTOMY  03/2005   "L4-5"  . POSTERIOR LUMBAR FUSION  10/2003   L5-S1; "plates, screws"  . SHOULDER ARTHROSCOPY Right 08/2011   Debridement of labrum, arthroscopic distal clavicle excision  . SHOULDER OPEN ROTATOR CUFF REPAIR Left 07/2014  . TEE WITHOUT CARDIOVERSION  07/07/2012   Procedure: TRANSESOPHAGEAL ECHOCARDIOGRAM (TEE);  Surgeon: Fay Records, MD;  Location: [facility];  Service: Cardiovascular;  Laterality: N/A;  . TONSILLECTOMY AND ADENOIDECTOMY  ~ 1956  . TOTAL KNEE ARTHROPLASTY Left 10/2006  . TRIGGER  FINGER RELEASE Left 10/2015       Home Medications    Prior to Admission medications   Medication Sig Start Date End Date Taking? Authorizing Provider  acetaminophen (TYLENOL 8 HOUR ARTHRITIS PAIN) 650 MG CR tablet Take 1,300 mg by mouth 2 (two) times daily as needed for pain (pain).    Historical Provider, MD  atorvastatin (LIPITOR) 40 MG tablet TAKE 1 TABLET BY MOUTH EVERY DAY Patient taking differently: Take 40 mg by mouth every day 04/12/16   Thayer Headings, MD  bisacodyl (DULCOLAX) 5 MG EC tablet Take 10 mg by mouth daily as needed for moderate constipation.    Historical Provider, MD  ciprofloxacin (CIPRO) 500 MG tablet Take 1 tablet (500 mg total) by mouth 2 (two) times daily. 06/02/16   Kelvin Cellar, MD  fenofibrate 160 MG tablet TAKE 1 TABLET (160 MG TOTAL) BY MOUTH DAILY. 07/23/15   Thayer Headings, MD  lisinopril (PRINIVIL,ZESTRIL) 10 MG tablet Take 10 mg by mouth daily. 12/17/14   Historical Provider, MD  metoprolol succinate (TOPROL-XL) 50 MG 24 hr tablet Take 2 tablets (100 mg total) by mouth daily. 01/05/16   Thayer Headings, MD  metroNIDAZOLE (FLAGYL) 500 MG tablet Take 1 tablet (500 mg total) by mouth 3 (three) times daily. 06/02/16   Kelvin Cellar, MD  Multiple Vitamins-Minerals (MULTIVITAMINS THER. W/MINERALS) TABS Take 1 tablet by mouth daily.      Historical Provider, MD  nitroGLYCERIN (NITROSTAT) 0.4 MG SL tablet Place 0.4 mg under the tongue every 5 (five) minutes as needed for chest pain.    Historical Provider, MD  NU-IRON 150 MG capsule Take 150 mg by mouth daily. 02/24/15   Historical Provider, MD  omeprazole (PRILOSEC) 10 MG capsule TAKE ONE CAPSULE BY MOUTH EVERY DAY Patient taking differently: Take 10 mg by mouth every day 05/03/16   Thayer Headings, MD  Psyllium (METAMUCIL) 48.57 % POWD Take 1 each by mouth daily.    Historical Provider, MD  Tamsulosin HCl (FLOMAX) 0.4 MG CAPS Take 0.4 mg by mouth daily.      Historical Provider, MD  traMADol-acetaminophen  (ULTRACET) 37.5-325 MG tablet Take 1 tablet by mouth every 6 (six) hours as needed for moderate pain. 06/02/16   Kelvin Cellar, MD  XARELTO 20 MG TABS tablet TAKE 1 TABLET BY MOUTH EVERY DAY Patient taking differently: Take 20 mg by mouth every day 03/15/16   Thayer Headings, MD    Family History Family History  Problem Relation Age of Onset  . Hypertension Mother   . Heart disease Father   . Heart attack Father     Social History Social History  Substance Use Topics  . Smoking status: Former Smoker    Packs/day: 1.00    Years: 34.00    Types: Cigarettes    Quit date: 05/05/2003  . Smokeless tobacco: Never Used  . Alcohol use No     Allergies   Codeine; Monopril [fosinopril sodium]; Ampicillin;  and Percocet [oxycodone-acetaminophen]   Review of Systems Review of Systems  All other systems reviewed and are negative.    Physical Exam Updated Vital Signs BP 136/83   Pulse 77   Temp 99.2 F (37.3 C) (Oral)   Resp 18   Ht '6\' 1"'$  (1.854 m)   Wt 118.4 kg   SpO2 96%   BMI 34.43 kg/m   Physical Exam  Constitutional: He appears well-developed and well-nourished. No distress.  Elderly male laying in bed in no acute discomfort, nontoxic  HENT:  Head: Atraumatic.  Eyes: Conjunctivae are normal.  Neck: Neck supple.  Cardiovascular: Normal rate and regular rhythm.   Pulmonary/Chest: Effort normal and breath sounds normal.  Abdominal: He exhibits distension. There is tenderness (Abdomen is distended, tenderness to suprapubic and left lower quadrant on palpation without guarding rebound tenderness.).  Genitourinary:  Genitourinary Comments: Chaperone present during exam. Normal rectal tone, no bleeding hemorrhoid, no obvious mass, normal prostate, no blood on glove.  Neurological: He is alert.  Skin: No rash noted.  Psychiatric: He has a normal mood and affect.  Nursing note and vitals reviewed.    ED Treatments / Results  Labs (all labs ordered are listed, but only  abnormal results are displayed) Labs Reviewed  COMPREHENSIVE METABOLIC PANEL - Abnormal; Notable for the following:       Result Value   Sodium 129 (*)    Chloride 98 (*)    Glucose, Bld 107 (*)    Calcium 8.7 (*)    Total Protein 6.4 (*)    Albumin 3.3 (*)    All other components within normal limits  URINALYSIS, ROUTINE W REFLEX MICROSCOPIC (NOT AT [facility]) - Abnormal; Notable for the following:    Color, Urine AMBER (*)    Leukocytes, UA SMALL (*)    All other components within normal limits  CBC WITH DIFFERENTIAL/PLATELET - Abnormal; Notable for the following:    WBC 13.6 (*)    Neutro Abs 10.6 (*)    Monocytes Absolute 1.8 (*)    All other components within normal limits  URINE MICROSCOPIC-ADD ON - Abnormal; Notable for the following:    Squamous Epithelial / LPF 0-5 (*)    All other components within normal limits  URINE CULTURE  I-STAT CG4 LACTIC ACID, ED  POC OCCULT BLOOD, ED    EKG  EKG Interpretation None       Radiology No results found.  Procedures Procedures (including critical care time)  Medications Ordered in ED Medications  ciprofloxacin (CIPRO) IVPB 400 mg (not administered)  metroNIDAZOLE (FLAGYL) IVPB 500 mg (not administered)  morphine 4 MG/ML injection 4 mg (not administered)  ondansetron (ZOFRAN-ODT) disintegrating tablet 8 mg (8 mg Oral Given 06/04/16 1249)     Initial Impression / Assessment and Plan / ED Course  I have reviewed the triage vital signs and the nursing notes.  Pertinent labs & imaging results that were available during my care of the patient were reviewed by me and considered in my medical decision making (see chart for details).  Clinical Course    BP 122/73   Pulse 77   Temp 99.2 F (37.3 C) (Oral)   Resp 18   Ht '6\' 1"'$  (1.854 m)   Wt 118.4 kg   SpO2 96%   BMI 34.43 kg/m    Final Clinical Impressions(s) / ED Diagnoses   Final diagnoses:  Diverticulitis of intestine without perforation or abscess without  bleeding    New Prescriptions New Prescriptions  No medications on file   2:39 PM Patient with low abdominal pain for the past week. CT scan several days prior shows evidence of uncompensated diverticulitis. Patient has fell outpatient treatment despite taking Cipro and Flagyl as prescribed. He is now having worsening abdominal pain, having nausea and dry heaving. He requests to be admitted. Labs remarkable for elevated WBC of 13.1. Appreciate consultation from triad hospitalist, Dr. Maylene Roes who agrees to admit patient to medical surgical bed under her care. Patient would likely benefit from a repeat abdominal pelvis CT scan to rule out abscess or perforation complicating his infection. Care discussed with Dr. Oswaldo Conroy, PA-C 06/04/16 [place], MD 06/04/16 [phone]

## 2016-06-04 NOTE — ED Triage Notes (Signed)
Pt presents with worsening abdominal cramping, nausea since being discharged on Wednesday.  Pt diagnosed with diverticulitis, is taking flagyl and cipro, with symptoms worsening.

## 2016-06-04 NOTE — Telephone Encounter (Signed)
Agree ith note by Army Melia, RN

## 2016-06-04 NOTE — ED Notes (Signed)
Admitting MD at bedside.

## 2016-06-04 NOTE — Progress Notes (Signed)
Pt admitted to 6N 26 from ED via wheelchair.  Pt AAOX4.  Pt on RA.  Pt has 20G to LT [facility] with fluids infusing.  Pt has no complaints at the moment.  Will continue to monitor.

## 2016-06-04 NOTE — H&P (Signed)
History and Physical    Corey [name] YQM:[id number] DOB: October 08, 1945 DOA: 06/04/2016  PCP: [name], MD  Patient coming from: Home  Chief Complaint: no improvement of LLQ abdominal pain  HPI: Corey [name] is a 70 y.o. male with medical history significant of recent uncomplicated diverticulitis admitted on 8/28 and discharged with cipro/flagyl Po, A Fib on anticoagulation, OSA on CPAP, HTN, CAD, who comes back to [facility] due to worsening abdominal pain, fevers. Patient had an episode of diverticulitis in 2015 and was treated in patient at [facility]. His last colonoscopy was a couple of years ago which was largely benign. He states that a week ago, he started having LLQ crampy abdominal pain which radiates to the RLQ, associated with nausea. He saw his PCP and was placed on cipro/flagyl without much improvement.  He came to the ED and was admitted for uncomplicated diverticulitis after CT abd/pelvis was completed. He was treated with IV cipro/flagyl, improved in abdominal pain, was able to see a [place] sandwich and was discharged home. He states that although nauseated and dry heaving, he has been able to keep down his oral cipro/flagyl. He states that since discharge, he has felt worse. He has only has mucus-like stools without anything formed, unable to eat much, worsening LLQ abdominal pain radiating to the RLQ, and fever 100.4 at home. He denies any chest pain, shortness of breath, cough, peripheral edema, bloody stools.   ED Course: Labs obtained. Cipro/flagyl IV as well as morphine and zofran given.   Review of Systems: As per HPI otherwise 10 point review of systems negative.   Past Medical History:  Diagnosis Date  . Arthritis    "back, right knee, hands, ankles, neck" (05/31/2016)  . Atrial fibrillation with RVR ([place])   . Chronic lower back pain   . Coronary atherosclerosis of native coronary artery    a. BMS to [facility] 2004 and 2007, otherwise mild nonobstructive disease. EF normal.  .  CVA (cerebral vascular accident) ([place]) 07/2012   AF discovered after stroke; denies residual on 12/04/2014  . Dyslipidemia   . Essential hypertension, benign   . GERD (gastroesophageal reflux disease)   . Lumbar radiculopathy, chronic 02/04/2015   Right L5  . Obesity   . OSA on CPAP   . Paroxysmal atrial fibrillation ([place])    a. Discovered after stroke.  . Pneumonia 01/1996  . PONV (postoperative nausea and vomiting)   . Recurrent upper respiratory infection (URI)     Past Surgical History:  Procedure Laterality Date  . ANTERIOR CERVICAL DECOMP/DISCECTOMY FUSION  07/2001; 10/2002   "C5-6; C6-7; redo"  . BACK SURGERY    . CARPAL TUNNEL RELEASE Left 10/2015  . COLONOSCOPY W/ POLYPECTOMY  02/2014  . CORONARY ANGIOPLASTY WITH STENT PLACEMENT  05/2003; 12/2005   "mid RCA; mid RCA"  . JOINT REPLACEMENT    . KNEE ARTHROSCOPY Left 10/2005  . KNEE ARTHROSCOPY W/ PARTIAL MEDIAL MENISCECTOMY Left 09/2005  . LUMBAR LAMINECTOMY/DECOMPRESSION MICRODISCECTOMY  03/2005   "L4-5"  . POSTERIOR LUMBAR FUSION  10/2003   L5-S1; "plates, screws"  . SHOULDER ARTHROSCOPY Right 08/2011   Debridement of labrum, arthroscopic distal clavicle excision  . SHOULDER OPEN ROTATOR CUFF REPAIR Left 07/2014  . TEE WITHOUT CARDIOVERSION  07/07/2012   Procedure: TRANSESOPHAGEAL ECHOCARDIOGRAM (TEE);  Surgeon: Fay Records, MD;  Location: [facility];  Service: Cardiovascular;  Laterality: N/A;  . TONSILLECTOMY AND ADENOIDECTOMY  ~ 1956  . TOTAL KNEE ARTHROPLASTY Left 10/2006  . TRIGGER FINGER RELEASE  Left 10/2015     reports that he quit smoking about 13 years ago. His smoking use included Cigarettes. He has a 34.00 pack-year smoking history. He has never used smokeless tobacco. He reports that he does not drink alcohol or use drugs.  Allergies  Allergen Reactions  . Codeine Nausea Only and Other (See Comments)    Heavy amounts cause nausea  . Monopril [Fosinopril Sodium] Other (See Comments)    Muscle aches & pain  .  Ampicillin Rash  . Percocet [Oxycodone-Acetaminophen] Nausea And Vomiting    Family History  Problem Relation Age of Onset  . Hypertension Mother   . Heart disease Father   . Heart attack Father    Prior to Admission medications   Medication Sig Start Date End Date Taking? Authorizing Provider  acetaminophen (TYLENOL 8 HOUR ARTHRITIS PAIN) 650 MG CR tablet Take 1,300 mg by mouth 2 (two) times daily as needed for pain (pain).   Yes Historical Provider, MD  atorvastatin (LIPITOR) 40 MG tablet TAKE 1 TABLET BY MOUTH EVERY DAY Patient taking differently: Take 40 mg by mouth every day 04/12/16  Yes Thayer Headings, MD  bisacodyl (DULCOLAX) 5 MG EC tablet Take 10 mg by mouth daily as needed for moderate constipation.   Yes Historical Provider, MD  ciprofloxacin (CIPRO) 500 MG tablet Take 1 tablet (500 mg total) by mouth 2 (two) times daily. 06/02/16  Yes Kelvin Cellar, MD  fenofibrate 160 MG tablet TAKE 1 TABLET (160 MG TOTAL) BY MOUTH DAILY. 07/23/15  Yes Thayer Headings, MD  lisinopril (PRINIVIL,ZESTRIL) 10 MG tablet Take 10 mg by mouth daily. 12/17/14  Yes Historical Provider, MD  metoprolol succinate (TOPROL-XL) 50 MG 24 hr tablet Take 2 tablets (100 mg total) by mouth daily. 01/05/16  Yes Thayer Headings, MD  metroNIDAZOLE (FLAGYL) 500 MG tablet Take 1 tablet (500 mg total) by mouth 3 (three) times daily. 06/02/16  Yes Kelvin Cellar, MD  Multiple Vitamins-Minerals (MULTIVITAMINS THER. W/MINERALS) TABS Take 1 tablet by mouth daily.     Yes Historical Provider, MD  nitroGLYCERIN (NITROSTAT) 0.4 MG SL tablet Place 0.4 mg under the tongue every 5 (five) minutes as needed for chest pain.   Yes Historical Provider, MD  NU-IRON 150 MG capsule Take 150 mg by mouth daily. 02/24/15  Yes Historical Provider, MD  omeprazole (PRILOSEC) 10 MG capsule TAKE ONE CAPSULE BY MOUTH EVERY DAY Patient taking differently: Take 10 mg by mouth every day 05/03/16  Yes Thayer Headings, MD  Psyllium (METAMUCIL) 48.57 %  POWD Take 1 each by mouth daily.   Yes Historical Provider, MD  Tamsulosin HCl (FLOMAX) 0.4 MG CAPS Take 0.4 mg by mouth daily.     Yes Historical Provider, MD  traMADol-acetaminophen (ULTRACET) 37.5-325 MG tablet Take 1 tablet by mouth every 6 (six) hours as needed for moderate pain. 06/02/16  Yes Kelvin Cellar, MD  XARELTO 20 MG TABS tablet TAKE 1 TABLET BY MOUTH EVERY DAY Patient taking differently: Take 20 mg by mouth every day 03/15/16  Yes Thayer Headings, MD    Physical Exam: Vitals:   06/04/16 1400 06/04/16 1430 06/04/16 1500 06/04/16 1515  BP: 122/73 128/86 136/77   Pulse:    69  Resp:      Temp:      TempSrc:      SpO2:    97%  Weight:      Height:       Constitutional: NAD, calm, comfortable Vitals:   06/04/16  1400 06/04/16 1430 06/04/16 1500 06/04/16 1515  BP: 122/73 128/86 136/77   Pulse:    69  Resp:      Temp:      TempSrc:      SpO2:    97%  Weight:      Height:       Eyes: PERRL, lids and conjunctivae normal ENMT: Mucous membranes are moist. Posterior pharynx clear of any exudate or lesions.Normal dentition.  Neck: normal, supple, no masses, no thyromegaly Respiratory: clear to auscultation bilaterally, no wheezing, no crackles. Normal respiratory effort. No accessory muscle use.  Cardiovascular: Regular rate and rhythm, no murmurs / rubs / gallops. No extremity edema. 2+ pedal pulses. Abdomen: TTP LLQ and RLQ, obese but soft, no masses palpated. No hepatosplenomegaly. Bowel sounds positive.  Musculoskeletal: no clubbing / cyanosis. No joint deformity upper and lower extremities. Good ROM, no contractures. Normal muscle tone.  Skin: no rashes, lesions, ulcers. No induration Neurologic: CN 2-12 grossly intact. Sensation intact, DTR normal. Strength 5/5 in all 4.  Psychiatric: Normal judgment and insight. Alert and oriented x 3. Normal mood.   Labs on Admission: I have personally reviewed following labs and imaging studies  CBC:  Recent Labs Lab  05/31/16 1237 06/01/16 0537 06/04/16 1304  WBC 12.0* 10.6* 13.6*  NEUTROABS  --   --  10.6*  HGB 14.9 13.6 13.5  HCT 45.0 41.9 40.1  MCV 93.2 94.8 90.3  PLT 259 202 364   Basic Metabolic Panel:  Recent Labs Lab 05/31/16 1237 06/01/16 0537 06/04/16 1304  NA 132* 138 129*  K 4.3 4.5 4.0  CL 101 104 98*  CO2 '22 26 22  '$ GLUCOSE 111* 104* 107*  BUN 29* 18 11  CREATININE 1.28* 1.01 1.04  CALCIUM 9.2 8.7* 8.7*  MG  --  2.0  --   PHOS  --  3.1  --    GFR: Estimated Creatinine Clearance: 91.6 mL/min (by C-G formula based on SCr of 1.04 mg/dL). Liver Function Tests:  Recent Labs Lab 05/31/16 1237 06/01/16 0537 06/04/16 1304  AST '24 18 22  '$ ALT 35 27 22  ALKPHOS 43 36* 46  BILITOT 1.4* 1.1 0.9  PROT 7.2 6.1* 6.4*  ALBUMIN 4.3 3.5 3.3*    Recent Labs Lab 05/31/16 1237  LIPASE 26   No results for input(s): AMMONIA in the last 168 hours. Coagulation Profile:  Recent Labs Lab 05/31/16 1512  INR 1.83   Cardiac Enzymes: No results for input(s): CKTOTAL, CKMB, CKMBINDEX, TROPONINI in the last 168 hours. BNP (last 3 results) No results for input(s): PROBNP in the last 8760 hours. HbA1C: No results for input(s): HGBA1C in the last 72 hours. CBG: No results for input(s): GLUCAP in the last 168 hours. Lipid Profile: No results for input(s): CHOL, HDL, LDLCALC, TRIG, CHOLHDL, LDLDIRECT in the last 72 hours. Thyroid Function Tests: No results for input(s): TSH, T4TOTAL, FREET4, T3FREE, THYROIDAB in the last 72 hours. Anemia Panel: No results for input(s): VITAMINB12, FOLATE, FERRITIN, TIBC, IRON, RETICCTPCT in the last 72 hours. Urine analysis:    Component Value Date/Time   COLORURINE AMBER (A) 06/04/2016 1304   APPEARANCEUR CLEAR 06/04/2016 1304   LABSPEC 1.019 06/04/2016 1304   PHURINE 5.5 06/04/2016 1304   GLUCOSEU NEGATIVE 06/04/2016 1304   HGBUR NEGATIVE 06/04/2016 1304   BILIRUBINUR NEGATIVE 06/04/2016 1304   KETONESUR NEGATIVE 06/04/2016 1304    PROTEINUR NEGATIVE 06/04/2016 1304   NITRITE NEGATIVE 06/04/2016 1304   LEUKOCYTESUR SMALL (A) 06/04/2016 1304   )  No results found for this or any previous visit (from the past 240 hour(s)).   Radiological Exams on Admission: No results found.   Assessment/Plan Principal Problem:   Diverticulitis Active Problems:   Benign essential HTN   Dyslipidemia   GERD (gastroesophageal reflux disease)   Atrial fibrillation (HCC)   OSA (obstructive sleep apnea)   Hyponatremia  Diverticulitis  Will repeat CT abd/pelvis with contrast to r/o abscess or perforation due to non-improvement as well as worsening WBC  Switch antibiotics to ceftriaxone/flagyl   CLD   IVF   Hyponatremia  Serum osmol   IVF   Chronic A Fib  Xarelto '20mg'$  daily   Essential HTN  Lisinopril '10mg'$  daily   Toprol '100mg'$  daily   GERD  PPI daily   BPH  Flomax 0.'4mg'$  daily   HLD  Lipitor '40mg'$  daily    OSA   CPAP qhs    DVT prophylaxis: on xarelto  Code Status: Full  Family Communication: wife and son in room during exam, questions answered   Disposition Plan: Pending improvement of symptoms   Consults called: None  Admission status: inpatient, med surg    Shon Millet, [place] [facility] Pager [phone]  If 7PM-7AM, please contact night-coverage [email or web address] Password [facility] 06/04/2016, 3:44 PM

## 2016-06-05 DIAGNOSIS — I482 Chronic atrial fibrillation: Secondary | ICD-10-CM

## 2016-06-05 DIAGNOSIS — E785 Hyperlipidemia, unspecified: Secondary | ICD-10-CM

## 2016-06-05 DIAGNOSIS — G4733 Obstructive sleep apnea (adult) (pediatric): Secondary | ICD-10-CM

## 2016-06-05 DIAGNOSIS — K5792 Diverticulitis of intestine, part unspecified, without perforation or abscess without bleeding: Secondary | ICD-10-CM

## 2016-06-05 DIAGNOSIS — I10 Essential (primary) hypertension: Secondary | ICD-10-CM

## 2016-06-05 DIAGNOSIS — E871 Hypo-osmolality and hyponatremia: Secondary | ICD-10-CM

## 2016-06-05 LAB — OSMOLALITY, URINE: Osmolality, Ur: 373 mOsm/kg (ref 300–900)

## 2016-06-05 LAB — CBC WITH DIFFERENTIAL/PLATELET
Basophils Absolute: 0 10*3/uL (ref 0.0–0.1)
Basophils Relative: 0 %
Eosinophils Absolute: 0.1 10*3/uL (ref 0.0–0.7)
Eosinophils Relative: 1 %
HCT: 39.1 % (ref 39.0–52.0)
Hemoglobin: 13.2 g/dL (ref 13.0–17.0)
Lymphocytes Relative: 7 %
Lymphs Abs: 1 10*3/uL (ref 0.7–4.0)
MCH: 30.8 pg (ref 26.0–34.0)
MCHC: 33.8 g/dL (ref 30.0–36.0)
MCV: 91.1 fL (ref 78.0–100.0)
Monocytes Absolute: 1.9 10*3/uL — ABNORMAL HIGH (ref 0.1–1.0)
Monocytes Relative: 13 %
Neutro Abs: 11.9 10*3/uL — ABNORMAL HIGH (ref 1.7–7.7)
Neutrophils Relative %: 79 %
Platelets: 254 10*3/uL (ref 150–400)
RBC: 4.29 MIL/uL (ref 4.22–5.81)
RDW: 14.1 % (ref 11.5–15.5)
WBC: 14.9 10*3/uL — ABNORMAL HIGH (ref 4.0–10.5)

## 2016-06-05 LAB — URINE CULTURE: Culture: NO GROWTH

## 2016-06-05 LAB — BASIC METABOLIC PANEL
Anion gap: 9 (ref 5–15)
BUN: 10 mg/dL (ref 6–20)
CO2: 23 mmol/L (ref 22–32)
Calcium: 8.4 mg/dL — ABNORMAL LOW (ref 8.9–10.3)
Chloride: 96 mmol/L — ABNORMAL LOW (ref 101–111)
Creatinine, Ser: 1 mg/dL (ref 0.61–1.24)
GFR calc Af Amer: >60 mL/min (ref >60)
GFR calc non Af Amer: >60 mL/min (ref >60)
Glucose, Bld: 103 mg/dL — ABNORMAL HIGH (ref 65–99)
Potassium: 4.2 mmol/L (ref 3.5–5.1)
Sodium: 128 mmol/L — ABNORMAL LOW (ref 135–145)

## 2016-06-05 LAB — NA AND K (SODIUM & POTASSIUM), RAND UR
Potassium Urine: 32 mmol/L
Sodium, Ur: 20 mmol/L

## 2016-06-05 LAB — URIC ACID: Uric Acid, Serum: 5.9 mg/dL (ref 4.4–7.6)

## 2016-06-05 NOTE — Progress Notes (Signed)
Pt. set up/placed on Auto BiPAP for h/s, humidity filled on room air, with LG/FFM tolerating well.

## 2016-06-05 NOTE — Progress Notes (Signed)
PROGRESS NOTE    Corey [name]  BSW:[id number] DOB: [birth year] DOA: 06/04/2016 PCP: [name], MD    Brief Narrative: 70 y.o.WM  PMHx Recent uncomplicated diverticulitis admitted on 8/28 and discharged 8/30 with cipro/flagyl Po, A Fib on Anticoagulation, OSA on CPAP, HTN, CAD,   Who comes back to [facility] due to worsening abdominal pain, fevers. Patient had an episode of diverticulitis in 2015 and was treated in patient at [facility]. His last colonoscopy was a couple of years ago which was largely benign. He states that a week ago, he started having LLQ crampy abdominal pain which radiates to the RLQ, associated with nausea. He saw his PCP and was placed on cipro/flagyl without much improvement.  He came to the ED and was admitted for uncomplicated diverticulitis after CT abd/pelvis was completed. He was treated with IV cipro/flagyl, improved in abdominal pain, was able to see a [place] sandwich and was discharged home. He states that although nauseated and dry heaving, he has been able to keep down his oral cipro/flagyl. He states that since discharge, he has felt worse. He has only has mucus-like stools without anything formed, unable to eat much, worsening LLQ abdominal pain radiating to the RLQ, and fever 100.4 at home. He denies any chest pain, shortness of breath, cough, peripheral edema, bloody stools.    Subjective: 9/2 A/O 4, NAD. States abdominal pain significantly improved. States negative blood in stool after initial discharge, just increasing abdominal pain fever chills. In the last 24 hour MAXIMUM TEMPERATURE 37.9C     Assessment & Plan:   Principal Problem:   Diverticulitis Active Problems:   Benign essential HTN   Dyslipidemia   GERD (gastroesophageal reflux disease)   Atrial fibrillation (HCC)   OSA (obstructive sleep apnea)   Hyponatremia   Diverticulitis -CT abdomen and pelvis: Showed acute diverticulitis see  -Switch antibiotics to ceftriaxone/flagyl : Continue  until GI makes recommendations -Normal saline 100 ml/hr -Continue clear liquid diet -9/2 Consulted [facility] (per patient sees Dr. Leonia Reeves)  Hyponatremia (SIADH?) -Serum osmolality, urine osmolality pending -Urine sodium pending, -Uric acid pending  Chronic A Fib - Xarelto '20mg'$  daily  -Currently in NSR  Essential HTN - Lisinopril '10mg'$  daily -Toprol '100mg'$  daily   BPH -Flomax 0.'4mg'$  daily   HLD -Lipitor '40mg'$  daily    OSA  -CPAP qhs       DVT prophylaxis: Xarelto Code Status: Full Family Communication: None present  Disposition Plan: Resolution diverticulitis    Consultants:  [facility] pending   Procedures/Significant Events:  9/1 CT abdomen pelvis:-Acute diverticulitis at the mid sigmoid colon, with focal wall thickening, diffuse surrounding soft tissue inflammation and trace free fluid.  -mass cannot be entirely excluded;- - No evidence of perforation or abscess formation- stool bolus is noted distally.   Cultures 9/1 urine pending   Antimicrobials: Ceftriaxone 9/1>> Ciprofloxacin 9/11 dose Metronidazole 9/1>>   Devices    LINES / TUBES:      Continuous Infusions: . sodium chloride 100 mL/hr at 06/04/16 2124     Objective: Vitals:   06/04/16 1845 06/04/16 1930 06/04/16 2251 06/05/16 0451  BP: 131/88  133/65 139/79  Pulse: 79  77 78  Resp:   18 18  Temp:  99.2 F (37.3 C) 100.3 F (37.9 C) 99.1 F (37.3 C)  TempSrc:  Oral Oral Oral  SpO2:   95% 96%  Weight:      Height:        Intake/Output Summary (Last 24 hours) at 06/05/16 0731  Last data filed at 06/05/16 0300  Gross per 24 hour  Intake          1791.67 ml  Output              500 ml  Net          1291.67 ml   Filed Weights   06/04/16 1244 06/04/16 1559  Weight: 118.4 kg (261 lb) 119.7 kg (263 lb 12.8 oz)    Examination:  General: A/O 4, mild abdominal pain, No acute respiratory distress Eyes: negative scleral hemorrhage, negative anisocoria, negative  icterus ENT: Negative Runny nose, negative gingival bleeding, Neck:  Negative scars, masses, torticollis, lymphadenopathy, JVD Lungs: Clear to auscultation bilaterally without wheezes or crackles Cardiovascular: Regular rate and rhythm without murmur gallop or rub normal S1 and S2 Abdomen: negative abdominal pain, nondistended, positive soft, bowel sounds, no rebound, no ascites, no appreciable mass Extremities: No significant cyanosis, clubbing, or edema bilateral lower extremities Skin: Negative rashes, lesions, ulcers Psychiatric:  Negative depression, negative anxiety, negative fatigue, negative mania  Central nervous system:  Cranial nerves II through XII intact, tongue/uvula midline, all extremities muscle strength 5/5, sensation intact throughout, negative dysarthria, negative expressive aphasia, negative receptive aphasia.  .     Data Reviewed: Care during the described time interval was provided by me .  I have reviewed this patient's available data, including medical history, events of note, physical examination, and all test results as part of my evaluation. I have personally reviewed and interpreted all radiology studies.  CBC:  Recent Labs Lab 05/31/16 1237 06/01/16 0537 06/04/16 1304 06/05/16 0353  WBC 12.0* 10.6* 13.6* 14.9*  NEUTROABS  --   --  10.6* 11.9*  HGB 14.9 13.6 13.5 13.2  HCT 45.0 41.9 40.1 39.1  MCV 93.2 94.8 90.3 91.1  PLT 259 202 262 712   Basic Metabolic Panel:  Recent Labs Lab 05/31/16 1237 06/01/16 0537 06/04/16 1304 06/05/16 0353  NA 132* 138 129* 128*  K 4.3 4.5 4.0 4.2  CL 101 104 98* 96*  CO2 '22 26 22 23  '$ GLUCOSE 111* 104* 107* 103*  BUN 29* '18 11 10  '$ CREATININE 1.28* 1.01 1.04 1.00  CALCIUM 9.2 8.7* 8.7* 8.4*  MG  --  2.0  --   --   PHOS  --  3.1  --   --    GFR: Estimated Creatinine Clearance: 96.5 mL/min (by C-G formula based on SCr of 1 mg/dL). Liver Function Tests:  Recent Labs Lab 05/31/16 1237 06/01/16 0537  06/04/16 1304  AST '24 18 22  '$ ALT 35 27 22  ALKPHOS 43 36* 46  BILITOT 1.4* 1.1 0.9  PROT 7.2 6.1* 6.4*  ALBUMIN 4.3 3.5 3.3*    Recent Labs Lab 05/31/16 1237  LIPASE 26   No results for input(s): AMMONIA in the last 168 hours. Coagulation Profile:  Recent Labs Lab 05/31/16 1512  INR 1.83   Cardiac Enzymes: No results for input(s): CKTOTAL, CKMB, CKMBINDEX, TROPONINI in the last 168 hours. BNP (last 3 results) No results for input(s): PROBNP in the last 8760 hours. HbA1C: No results for input(s): HGBA1C in the last 72 hours. CBG: No results for input(s): GLUCAP in the last 168 hours. Lipid Profile: No results for input(s): CHOL, HDL, LDLCALC, TRIG, CHOLHDL, LDLDIRECT in the last 72 hours. Thyroid Function Tests: No results for input(s): TSH, T4TOTAL, FREET4, T3FREE, THYROIDAB in the last 72 hours. Anemia Panel: No results for input(s): VITAMINB12, FOLATE, FERRITIN, TIBC, IRON, RETICCTPCT in the  last 72 hours. Sepsis Labs:  Recent Labs Lab 06/04/16 1336  LATICACIDVEN 0.76    No results found for this or any previous visit (from the past 240 hour(s)).       Radiology Studies: Ct Abdomen Pelvis W Contrast  Result Date: 06/04/2016 CLINICAL DATA:  Acute onset of bilateral lower quadrant abdominal pain. Initial encounter. EXAM: CT ABDOMEN AND PELVIS WITH CONTRAST TECHNIQUE: Multidetector CT imaging of the abdomen and pelvis was performed using the standard protocol following bolus administration of intravenous contrast. CONTRAST:  100 mL ISOVUE-300 IOPAMIDOL (ISOVUE-300) INJECTION 61% COMPARISON:  CT of the abdomen and pelvis from 05/31/2016 FINDINGS: Minimal bibasilar atelectasis or scarring is noted. Scattered coronary artery calcifications are seen. The liver and spleen are unremarkable in appearance. The gallbladder is within normal limits. The pancreas and adrenal glands are unremarkable. A small 1.1 cm left renal cyst is noted. There is no evidence of  hydronephrosis. No renal or ureteral stones are seen. Nonspecific perinephric stranding is noted bilaterally. No free fluid is identified. The small bowel is unremarkable in appearance. The stomach is within normal limits. No acute vascular abnormalities are seen. Scattered calcification is seen along the abdominal aorta and its branches. The appendix is not well characterized; there is no evidence of appendicitis. Contrast progresses to the level of the mid transverse colon. There is focal wall thickening along the mid sigmoid colon, with diffuse surrounding soft tissue inflammation and trace free fluid, and underlying scattered diverticulosis, compatible with acute diverticulitis. An underlying mass cannot be entirely excluded. There is no evidence of perforation or abscess formation at this time, though a stool bolus is noted distally. The bladder is mildly distended. Mild bladder wall thickening may reflect the overlying diverticulitis. The prostate remains normal in size. No inguinal lymphadenopathy is seen. No acute osseous abnormalities are identified. There is grade 1 anterolisthesis of L5 on S1, with associated lumbar spinal fusion hardware at L5-S1 and underlying decompression. IMPRESSION: 1. Acute diverticulitis at the mid sigmoid colon, with focal wall thickening, diffuse surrounding soft tissue inflammation and trace free fluid. Underlying scattered diverticulosis noted. Underlying mass cannot be entirely excluded; sigmoidoscopy would be helpful for further evaluation, after completion of treatment for diverticulitis. No evidence of perforation or abscess formation at this time, though a stool bolus is noted distally. 2. Scattered calcification along the abdominal aorta and its branches. 3. Scattered coronary artery calcifications seen. 4. Small left renal cyst noted. Electronically Signed   By: Garald Balding M.D.   On: 06/04/2016 23:14        Scheduled Meds: . atorvastatin  40 mg Oral Daily   . cefTRIAXone (ROCEPHIN)  IV  1 g Intravenous Q24H  . lisinopril  10 mg Oral Daily  . metoprolol succinate  100 mg Oral Daily  . metronidazole  500 mg Intravenous Q8H  . pantoprazole  40 mg Oral Daily  . rivaroxaban  20 mg Oral q1800  . tamsulosin  0.4 mg Oral Daily   Continuous Infusions: . sodium chloride 100 mL/hr at 06/04/16 2124     LOS: 1 day    Time spent:40 min    WOODS, Geraldo Docker, MD [facility] Pager [phone]  If 7PM-7AM, please contact night-coverage [email or web address] Password [facility] 06/05/2016, 7:31 AM

## 2016-06-05 NOTE — Consult Note (Signed)
Referring Provider: Remy  Primary Care Physician:  [name], MD Primary Gastroenterologist:  Dr. Oletta Lamas  Reason for Consultation:  Diverticulitis   HPI: Corey [name] is a 70 y.o. male admitted to the hospital with recurrent lower quadrant pain and fever. Patient started having left lower quadrant pain lasted today. Was seen by either walk-in clinic. Was prescribed Cipro and Flagyl with no improvement in symptoms. Patient decided to come to the hospital. CT scan showed diverticulitis. Patient was again placed on Cipro and Flagyl. Patient started noticing improvement and was discharged on Wednesday. After going home patient started having fever with similar lower quadrant pain and cramps. Because of ongoing fever decided to come to the hospital. Repeat CT scan again showed sigmoid diverticulitis. GI is constipated for further evaluation. Patient denied any black stool or blood in the stool. Stools are dark as he's been on iron supplements. Occasional nausea but denied any vomiting. Tolerating diet. Some improvement in pain since admission.  Last colonoscopy 03/26/2014 by Dr. Percell Miller showed tubular adenoma. Repeat was recommended in 3 years. I do not see any recent encounter by gastroenterologist. Past Medical History:  Diagnosis Date  . Arthritis    "back, right knee, hands, ankles, neck" (05/31/2016)  . Atrial fibrillation with RVR ([place])   . Chronic lower back pain   . Coronary atherosclerosis of native coronary artery    a. BMS to [facility] 2004 and 2007, otherwise mild nonobstructive disease. EF normal.  . CVA (cerebral vascular accident) ([place]) 07/2012   AF discovered after stroke; denies residual on 12/04/2014  . Dyslipidemia   . Essential hypertension, benign   . GERD (gastroesophageal reflux disease)   . Lumbar radiculopathy, chronic 02/04/2015   Right L5  . Obesity   . OSA on CPAP   . Paroxysmal atrial fibrillation ([place])    a. Discovered after stroke.  . Pneumonia 01/1996  . PONV  (postoperative nausea and vomiting)   . Recurrent upper respiratory infection (URI)     Past Surgical History:  Procedure Laterality Date  . ANTERIOR CERVICAL DECOMP/DISCECTOMY FUSION  07/2001; 10/2002   "C5-6; C6-7; redo"  . BACK SURGERY    . CARPAL TUNNEL RELEASE Left 10/2015  . COLONOSCOPY W/ POLYPECTOMY  02/2014  . CORONARY ANGIOPLASTY WITH STENT PLACEMENT  05/2003; 12/2005   "mid RCA; mid RCA"  . JOINT REPLACEMENT    . KNEE ARTHROSCOPY Left 10/2005  . KNEE ARTHROSCOPY W/ PARTIAL MEDIAL MENISCECTOMY Left 09/2005  . LUMBAR LAMINECTOMY/DECOMPRESSION MICRODISCECTOMY  03/2005   "L4-5"  . POSTERIOR LUMBAR FUSION  10/2003   L5-S1; "plates, screws"  . SHOULDER ARTHROSCOPY Right 08/2011   Debridement of labrum, arthroscopic distal clavicle excision  . SHOULDER OPEN ROTATOR CUFF REPAIR Left 07/2014  . TEE WITHOUT CARDIOVERSION  07/07/2012   Procedure: TRANSESOPHAGEAL ECHOCARDIOGRAM (TEE);  Surgeon: Fay Records, MD;  Location: [place];  Service: Cardiovascular;  Laterality: N/A;  . TONSILLECTOMY AND ADENOIDECTOMY  ~ 1956  . TOTAL KNEE ARTHROPLASTY Left 10/2006  . TRIGGER FINGER RELEASE Left 10/2015    Prior to Admission medications   Medication Sig Start Date End Date Taking? Authorizing Provider  acetaminophen (TYLENOL 8 HOUR ARTHRITIS PAIN) 650 MG CR tablet Take 1,300 mg by mouth 2 (two) times daily as needed for pain (pain).   Yes Historical Provider, MD  atorvastatin (LIPITOR) 40 MG tablet TAKE 1 TABLET BY MOUTH EVERY DAY Patient taking differently: Take 40 mg by mouth every day 04/12/16  Yes Thayer Headings, MD  bisacodyl (DULCOLAX) 5  MG EC tablet Take 10 mg by mouth daily as needed for moderate constipation.   Yes Historical Provider, MD  ciprofloxacin (CIPRO) 500 MG tablet Take 1 tablet (500 mg total) by mouth 2 (two) times daily. 06/02/16  Yes Kelvin Cellar, MD  fenofibrate 160 MG tablet TAKE 1 TABLET (160 MG TOTAL) BY MOUTH DAILY. 07/23/15  Yes Thayer Headings, MD  lisinopril  (PRINIVIL,ZESTRIL) 10 MG tablet Take 10 mg by mouth daily. 12/17/14  Yes Historical Provider, MD  metoprolol succinate (TOPROL-XL) 50 MG 24 hr tablet Take 2 tablets (100 mg total) by mouth daily. 01/05/16  Yes Thayer Headings, MD  metroNIDAZOLE (FLAGYL) 500 MG tablet Take 1 tablet (500 mg total) by mouth 3 (three) times daily. 06/02/16  Yes Kelvin Cellar, MD  Multiple Vitamins-Minerals (MULTIVITAMINS THER. W/MINERALS) TABS Take 1 tablet by mouth daily.     Yes Historical Provider, MD  nitroGLYCERIN (NITROSTAT) 0.4 MG SL tablet Place 0.4 mg under the tongue every 5 (five) minutes as needed for chest pain.   Yes Historical Provider, MD  NU-IRON 150 MG capsule Take 150 mg by mouth daily. 02/24/15  Yes Historical Provider, MD  omeprazole (PRILOSEC) 10 MG capsule TAKE ONE CAPSULE BY MOUTH EVERY DAY Patient taking differently: Take 10 mg by mouth every day 05/03/16  Yes Thayer Headings, MD  Psyllium (METAMUCIL) 48.57 % POWD Take 1 each by mouth daily.   Yes Historical Provider, MD  Tamsulosin HCl (FLOMAX) 0.4 MG CAPS Take 0.4 mg by mouth daily.     Yes Historical Provider, MD  traMADol-acetaminophen (ULTRACET) 37.5-325 MG tablet Take 1 tablet by mouth every 6 (six) hours as needed for moderate pain. 06/02/16  Yes Kelvin Cellar, MD  XARELTO 20 MG TABS tablet TAKE 1 TABLET BY MOUTH EVERY DAY Patient taking differently: Take 20 mg by mouth every day 03/15/16  Yes Thayer Headings, MD    Scheduled Meds: . atorvastatin  40 mg Oral Daily  . cefTRIAXone (ROCEPHIN)  IV  1 g Intravenous Q24H  . lisinopril  10 mg Oral Daily  . metoprolol succinate  100 mg Oral Daily  . metronidazole  500 mg Intravenous Q8H  . pantoprazole  40 mg Oral Daily  . rivaroxaban  20 mg Oral q1800  . tamsulosin  0.4 mg Oral Daily   Continuous Infusions: . sodium chloride 100 mL/hr at 06/04/16 2124   PRN Meds:.morphine injection, ondansetron (ZOFRAN) IV  Allergies as of 06/04/2016 - Review Complete 06/04/2016  Allergen Reaction  Noted  . Codeine Nausea Only and Other (See Comments) 08/17/2011  . Monopril [fosinopril sodium] Other (See Comments) 08/17/2011  . Ampicillin Rash 08/17/2011  . Percocet [oxycodone-acetaminophen] Nausea And Vomiting 05/31/2016    Family History  Problem Relation Age of Onset  . Hypertension Mother   . Heart disease Father   . Heart attack Father     Social History   Social History  . Marital status: Married    Spouse name: Corey [name]  . Number of children: 3  . Years of education: 14   Occupational History  . Retired Disabled     Works at [employer] part time   Social History Main Topics  . Smoking status: Former Smoker    Packs/day: 1.00    Years: 34.00    Types: Cigarettes    Quit date: 05/05/2003  . Smokeless tobacco: Never Used  . Alcohol use No  . Drug use: No  . Sexual activity: Yes   Other Topics Concern  .  Not on file   Social History Narrative   Patient lives at home with spouse.   Caffeine Use:     Review of Systems: All negative except as stated above in HPI.  Physical Exam: Vital signs: Vitals:   06/04/16 2251 06/05/16 0451  BP: 133/65 139/79  Pulse: 77 78  Resp: 18 18  Temp: 100.3 F (37.9 C) 99.1 F (37.3 C)   Last BM Date: 05/28/16 General:   Alert,  Well-developed, well-nourished, pleasant and cooperative in NAD Lungs:  Clear throughout to auscultation.   No wheezes, crackles, or rhonchi. No acute distress. Heart:  Regular rate and rhythm; no murmurs, clicks, rubs,  or gallops. Abdomen: soft, ND, LLQ and Suprapubic TTP, BS +  LE : no edema  Rectal:  Deferred  GI:  Lab Results:  Recent Labs  06/04/16 1304 06/05/16 0353  WBC 13.6* 14.9*  HGB 13.5 13.2  HCT 40.1 39.1  PLT 262 254   BMET  Recent Labs  06/04/16 1304 06/05/16 0353  NA 129* 128*  K 4.0 4.2  CL 98* 96*  CO2 22 23  GLUCOSE 107* 103*  BUN 11 10  CREATININE 1.04 1.00  CALCIUM 8.7* 8.4*   LFT  Recent Labs  06/04/16 1304  PROT 6.4*  ALBUMIN 3.3*  AST 22   ALT 22  ALKPHOS 46  BILITOT 0.9   PT/INR No results for input(s): LABPROT, INR in the last 72 hours.   Studies/Results: Ct Abdomen Pelvis W Contrast  Result Date: 06/04/2016 CLINICAL DATA:  Acute onset of bilateral lower quadrant abdominal pain. Initial encounter. EXAM: CT ABDOMEN AND PELVIS WITH CONTRAST TECHNIQUE: Multidetector CT imaging of the abdomen and pelvis was performed using the standard protocol following bolus administration of intravenous contrast. CONTRAST:  100 mL ISOVUE-300 IOPAMIDOL (ISOVUE-300) INJECTION 61% COMPARISON:  CT of the abdomen and pelvis from 05/31/2016 FINDINGS: Minimal bibasilar atelectasis or scarring is noted. Scattered coronary artery calcifications are seen. The liver and spleen are unremarkable in appearance. The gallbladder is within normal limits. The pancreas and adrenal glands are unremarkable. A small 1.1 cm left renal cyst is noted. There is no evidence of hydronephrosis. No renal or ureteral stones are seen. Nonspecific perinephric stranding is noted bilaterally. No free fluid is identified. The small bowel is unremarkable in appearance. The stomach is within normal limits. No acute vascular abnormalities are seen. Scattered calcification is seen along the abdominal aorta and its branches. The appendix is not well characterized; there is no evidence of appendicitis. Contrast progresses to the level of the mid transverse colon. There is focal wall thickening along the mid sigmoid colon, with diffuse surrounding soft tissue inflammation and trace free fluid, and underlying scattered diverticulosis, compatible with acute diverticulitis. An underlying mass cannot be entirely excluded. There is no evidence of perforation or abscess formation at this time, though a stool bolus is noted distally. The bladder is mildly distended. Mild bladder wall thickening may reflect the overlying diverticulitis. The prostate remains normal in size. No inguinal lymphadenopathy is  seen. No acute osseous abnormalities are identified. There is grade 1 anterolisthesis of L5 on S1, with associated lumbar spinal fusion hardware at L5-S1 and underlying decompression. IMPRESSION: 1. Acute diverticulitis at the mid sigmoid colon, with focal wall thickening, diffuse surrounding soft tissue inflammation and trace free fluid. Underlying scattered diverticulosis noted. Underlying mass cannot be entirely excluded; sigmoidoscopy would be helpful for further evaluation, after completion of treatment for diverticulitis. No evidence of perforation or abscess formation at this  time, though a stool bolus is noted distally. 2. Scattered calcification along the abdominal aorta and its branches. 3. Scattered coronary artery calcifications seen. 4. Small left renal cyst noted. Electronically Signed   By: Garald Balding M.D.   On: 06/04/2016 23:14    Impression/Plan: Non-resolving diverticulitis. Repeat CT showed acute diverticulitis at the mid sigmoid colon similar to CT scan on 05/31/2016. - Atrial fibrillation. On Xarelto - Personal history of colonic polyp. Last colonoscopy June 2015 by Dr. Oletta Lamas.  Recommendations ------------------------- - Patient with no improvement while on ciprofloxacin and Flagyl. Currently on Cipro and Flagyl Plus Rocephin  - Patient with history of allergy to Ampicillin causing rash 20 years ago. Consider trying Zosyn if no improvement with current antibiotic regimen. May need to involve ID given failure of Cipro/Flagyl and allergy to ampicillin. - Recommend outpatient follow-up in GI clinic after discharge. - GI will follow    LOS: 1 day   Milind Raether  06/05/2016, 8:27 AM  Pager [phone] If no answer or after 5 PM call [phone]

## 2016-06-06 LAB — CLOSTRIDIUM DIFFICILE BY PCR: Toxigenic C. Difficile by PCR: POSITIVE — AB

## 2016-06-06 LAB — C DIFFICILE QUICK SCREEN W PCR REFLEX
C Diff antigen: POSITIVE — AB
C Diff toxin: NEGATIVE

## 2016-06-06 MED ORDER — CEFDINIR 125 MG/5ML PO SUSR
300.0000 mg | Freq: Two times a day (BID) | ORAL | Status: DC
  Administered 2016-06-06 – 2016-06-10 (×9): 300 mg via ORAL
  Filled 2016-06-06 (×9): qty 15

## 2016-06-06 MED ORDER — METRONIDAZOLE 500 MG PO TABS
500.0000 mg | ORAL_TABLET | Freq: Three times a day (TID) | ORAL | Status: DC
  Administered 2016-06-06 – 2016-06-10 (×12): 500 mg via ORAL
  Filled 2016-06-06 (×12): qty 1

## 2016-06-06 NOTE — Progress Notes (Signed)
PROGRESS NOTE    Corey [name]  JEH:[id number] DOB: Mar 03, 1946 DOA: 06/04/2016 PCP: [name], MD    Brief Narrative: 70 y.o.WM  PMHx Recent uncomplicated diverticulitis admitted on 8/28 and discharged 8/30 with cipro/flagyl Po, A Fib on Anticoagulation, OSA on CPAP, HTN, CAD,   Who comes back to [facility] due to worsening abdominal pain, fevers. Patient had an episode of diverticulitis in 2015 and was treated in patient at [facility]. His last colonoscopy was a couple of years ago which was largely benign. He states that a week ago, he started having LLQ crampy abdominal pain which radiates to the RLQ, associated with nausea. He saw his PCP and was placed on cipro/flagyl without much improvement.  He came to the ED and was admitted for uncomplicated diverticulitis after CT abd/pelvis was completed. He was treated with IV cipro/flagyl, improved in abdominal pain, was able to see a [place] sandwich and was discharged home. He states that although nauseated and dry heaving, he has been able to keep down his oral cipro/flagyl. He states that since discharge, he has felt worse. He has only has mucus-like stools without anything formed, unable to eat much, worsening LLQ abdominal pain radiating to the RLQ, and fever 100.4 at home. He denies any chest pain, shortness of breath, cough, peripheral edema, bloody stools.    Subjective: 9/3 A/O 4, NAD. States abdominal pain significantly improved, but still having pain described as cramping. Afebrile last 24 hours.     Assessment & Plan:   Principal Problem:   Diverticulitis Active Problems:   Benign essential HTN   Dyslipidemia   GERD (gastroesophageal reflux disease)   Atrial fibrillation (HCC)   OSA (obstructive sleep apnea)   Hyponatremia   Diverticulitis of intestine without perforation or abscess without bleeding   Diverticulitis -CT abdomen and pelvis: Showed acute diverticulitis see  -Per GI recommendation: Switch antibiotics to  Cefdinir 300 mg twice a day along with Flagyl :  -Normal saline 100 ml/hr -Advance diet to heart healthy [name]) -Follow-up in GI clinic 4 weeks post discharge  Hyponatremia (SIADH?) -Serum osmolality, urine osmolality not consistent with SIADH -Urine sodium not consistent with SIADH, -Uric acid not consistent with SIADH Recent Labs Lab 05/31/16 1237 06/01/16 0537 06/04/16 1304 06/05/16 0353  NA 132* 138 129* 128*   Chronic A Fib - Xarelto '20mg'$  daily  -Currently in NSR  Essential HTN - Lisinopril '10mg'$  daily -Toprol '100mg'$  daily   BPH -Flomax 0.'4mg'$  daily   HLD -Lipitor '40mg'$  daily    OSA  -CPAP qhs       DVT prophylaxis: Xarelto Code Status: Full Family Communication: Wife present for discussion of plan of care Disposition Plan: Resolution diverticulitis. Discharge on 9/4?   Consultants:  Dr.Parag Fabio [facility]    Procedures/Significant Events:  9/1 CT abdomen pelvis:-Acute diverticulitis at the mid sigmoid colon, with focal wall thickening, diffuse surrounding soft tissue inflammation and trace free fluid.  -mass cannot be entirely excluded;- - No evidence of perforation or abscess formation- stool bolus is noted distally.   Cultures 9/1 urine pending   Antimicrobials: Ceftriaxone 9/1>> 9/3 Ciprofloxacin 9/11 dose Metronidazole 9/1>> Cefdinir 9/3>>   Devices    LINES / TUBES:      Continuous Infusions: . sodium chloride 100 mL/hr at 06/06/16 0500     Objective: Vitals:   06/05/16 0451 06/05/16 1442 06/05/16 2103 06/06/16 0433  BP: 139/79 (!) 110/55 120/60 131/65  Pulse: 78 82 85 70  Resp: '18  19 19  '$ Temp:  99.1 F (37.3 C) 98 F (36.7 C) 98 F (36.7 C) 99 F (37.2 C)  TempSrc: Oral Oral Oral Oral  SpO2: 96% 95% 96% 96%  Weight:      Height:        Intake/Output Summary (Last 24 hours) at 06/06/16 0962 Last data filed at 06/06/16 0500  Gross per 24 hour  Intake             2630 ml  Output              951 ml    Net             1679 ml   Filed Weights   06/04/16 1244 06/04/16 1559  Weight: 118.4 kg (261 lb) 119.7 kg (263 lb 12.8 oz)    Examination:  General: A/O 4, mild abdominal pain>> LLQ, No acute respiratory distress Eyes: negative scleral hemorrhage, negative anisocoria, negative icterus ENT: Negative Runny nose, negative gingival bleeding, Neck:  Negative scars, masses, torticollis, lymphadenopathy, JVD Lungs: Clear to auscultation bilaterally without wheezes or crackles Cardiovascular: Regular rate and rhythm without murmur gallop or rub normal S1 and S2 Abdomen: Positive abdominal pain>> LLQ to palpation, nondistended, positive soft, bowel sounds, no rebound, no ascites, no appreciable mass Extremities: No significant cyanosis, clubbing, or edema bilateral lower extremities Skin: Negative rashes, lesions, ulcers Psychiatric:  Negative depression, negative anxiety, negative fatigue, negative mania  Central nervous system:  Cranial nerves II through XII intact, tongue/uvula midline, all extremities muscle strength 5/5, sensation intact throughout, negative dysarthria, negative expressive aphasia, negative receptive aphasia.  .     Data Reviewed: Care during the described time interval was provided by me .  I have reviewed this patient's available data, including medical history, events of note, physical examination, and all test results as part of my evaluation. I have personally reviewed and interpreted all radiology studies.  CBC:  Recent Labs Lab 05/31/16 1237 06/01/16 0537 06/04/16 1304 06/05/16 0353  WBC 12.0* 10.6* 13.6* 14.9*  NEUTROABS  --   --  10.6* 11.9*  HGB 14.9 13.6 13.5 13.2  HCT 45.0 41.9 40.1 39.1  MCV 93.2 94.8 90.3 91.1  PLT 259 202 262 836   Basic Metabolic Panel:  Recent Labs Lab 05/31/16 1237 06/01/16 0537 06/04/16 1304 06/05/16 0353  NA 132* 138 129* 128*  K 4.3 4.5 4.0 4.2  CL 101 104 98* 96*  CO2 '22 26 22 23  '$ GLUCOSE 111* 104* 107* 103*   BUN 29* '18 11 10  '$ CREATININE 1.28* 1.01 1.04 1.00  CALCIUM 9.2 8.7* 8.7* 8.4*  MG  --  2.0  --   --   PHOS  --  3.1  --   --    GFR: Estimated Creatinine Clearance: 96.5 mL/min (by C-G formula based on SCr of 1 mg/dL). Liver Function Tests:  Recent Labs Lab 05/31/16 1237 06/01/16 0537 06/04/16 1304  AST '24 18 22  '$ ALT 35 27 22  ALKPHOS 43 36* 46  BILITOT 1.4* 1.1 0.9  PROT 7.2 6.1* 6.4*  ALBUMIN 4.3 3.5 3.3*    Recent Labs Lab 05/31/16 1237  LIPASE 26   No results for input(s): AMMONIA in the last 168 hours. Coagulation Profile:  Recent Labs Lab 05/31/16 1512  INR 1.83   Cardiac Enzymes: No results for input(s): CKTOTAL, CKMB, CKMBINDEX, TROPONINI in the last 168 hours. BNP (last 3 results) No results for input(s): PROBNP in the last 8760 hours. HbA1C: No results for input(s): HGBA1C in the  last 72 hours. CBG: No results for input(s): GLUCAP in the last 168 hours. Lipid Profile: No results for input(s): CHOL, HDL, LDLCALC, TRIG, CHOLHDL, LDLDIRECT in the last 72 hours. Thyroid Function Tests: No results for input(s): TSH, T4TOTAL, FREET4, T3FREE, THYROIDAB in the last 72 hours. Anemia Panel: No results for input(s): VITAMINB12, FOLATE, FERRITIN, TIBC, IRON, RETICCTPCT in the last 72 hours. Sepsis Labs:  Recent Labs Lab 06/04/16 1336  LATICACIDVEN 0.76    Recent Results (from the past 240 hour(s))  Urine culture     Status: None   Collection Time: 06/04/16  1:04 PM  Result Value Ref Range Status   Specimen Description URINE, RANDOM  Final   Special Requests NONE  Final   Culture NO GROWTH  Final   Report Status 06/05/2016 FINAL  Final         Radiology Studies: Ct Abdomen Pelvis W Contrast  Result Date: 06/04/2016 CLINICAL DATA:  Acute onset of bilateral lower quadrant abdominal pain. Initial encounter. EXAM: CT ABDOMEN AND PELVIS WITH CONTRAST TECHNIQUE: Multidetector CT imaging of the abdomen and pelvis was performed using the standard  protocol following bolus administration of intravenous contrast. CONTRAST:  100 mL ISOVUE-300 IOPAMIDOL (ISOVUE-300) INJECTION 61% COMPARISON:  CT of the abdomen and pelvis from 05/31/2016 FINDINGS: Minimal bibasilar atelectasis or scarring is noted. Scattered coronary artery calcifications are seen. The liver and spleen are unremarkable in appearance. The gallbladder is within normal limits. The pancreas and adrenal glands are unremarkable. A small 1.1 cm left renal cyst is noted. There is no evidence of hydronephrosis. No renal or ureteral stones are seen. Nonspecific perinephric stranding is noted bilaterally. No free fluid is identified. The small bowel is unremarkable in appearance. The stomach is within normal limits. No acute vascular abnormalities are seen. Scattered calcification is seen along the abdominal aorta and its branches. The appendix is not well characterized; there is no evidence of appendicitis. Contrast progresses to the level of the mid transverse colon. There is focal wall thickening along the mid sigmoid colon, with diffuse surrounding soft tissue inflammation and trace free fluid, and underlying scattered diverticulosis, compatible with acute diverticulitis. An underlying mass cannot be entirely excluded. There is no evidence of perforation or abscess formation at this time, though a stool bolus is noted distally. The bladder is mildly distended. Mild bladder wall thickening may reflect the overlying diverticulitis. The prostate remains normal in size. No inguinal lymphadenopathy is seen. No acute osseous abnormalities are identified. There is grade 1 anterolisthesis of L5 on S1, with associated lumbar spinal fusion hardware at L5-S1 and underlying decompression. IMPRESSION: 1. Acute diverticulitis at the mid sigmoid colon, with focal wall thickening, diffuse surrounding soft tissue inflammation and trace free fluid. Underlying scattered diverticulosis noted. Underlying mass cannot be  entirely excluded; sigmoidoscopy would be helpful for further evaluation, after completion of treatment for diverticulitis. No evidence of perforation or abscess formation at this time, though a stool bolus is noted distally. 2. Scattered calcification along the abdominal aorta and its branches. 3. Scattered coronary artery calcifications seen. 4. Small left renal cyst noted. Electronically Signed   By: Garald Balding M.D.   On: 06/04/2016 23:14        Scheduled Meds: . atorvastatin  40 mg Oral Daily  . cefTRIAXone (ROCEPHIN)  IV  1 g Intravenous Q24H  . lisinopril  10 mg Oral Daily  . metoprolol succinate  100 mg Oral Daily  . metronidazole  500 mg Intravenous Q8H  . pantoprazole  40  mg Oral Daily  . rivaroxaban  20 mg Oral q1800  . tamsulosin  0.4 mg Oral Daily   Continuous Infusions: . sodium chloride 100 mL/hr at 06/06/16 0500     LOS: 2 days    Time spent:40 min    Manjinder Breau, Geraldo Docker, MD [facility] Pager [phone]  If 7PM-7AM, please contact night-coverage [email or web address] Password [email or web address] 06/06/2016, 9:24 AM

## 2016-06-06 NOTE — Progress Notes (Signed)
Patient to self administer CPAP when ready for bed.  Patient is familiar with equipment and procedure.  Patient states that he has had no issues with the machine.

## 2016-06-06 NOTE — Progress Notes (Signed)
[facility] Gastroenterology Progress Note  Corey [name] 70 y.o. 1946/02/15   Subjective: Patient is feeling better. Abdominal pain resolved. Denied any fever. Denied nausea or vomiting. Tolerating diet.  Objective: Vital signs in last 24 hours: Vitals:   06/05/16 2103 06/06/16 0433  BP: 120/60 131/65  Pulse: 85 70  Resp: 19 19  Temp: 98 F (36.7 C) 99 F (37.2 C)    Physical Exam:  General:  Alert, cooperative, no distress, appears stated age  Head:  Normocephalic, without obvious abnormality, atraumatic  Eyes:  , EOM's intact,   Lungs:   Clear to auscultation bilaterally, respirations unlabored  Heart:  Regular rate and rhythm, S1, S2 normal  Abdomen:   Soft, non-tender, bowel sounds active all four quadrants,  no masses,   Extremities: Extremities normal, atraumatic, no  edema  Pulses: 2+ and symmetric    Lab Results:  Recent Labs  06/04/16 1304 06/05/16 0353  NA 129* 128*  K 4.0 4.2  CL 98* 96*  CO2 22 23  GLUCOSE 107* 103*  BUN 11 10  CREATININE 1.04 1.00  CALCIUM 8.7* 8.4*    Recent Labs  06/04/16 1304  AST 22  ALT 22  ALKPHOS 46  BILITOT 0.9  PROT 6.4*  ALBUMIN 3.3*    Recent Labs  06/04/16 1304 06/05/16 0353  WBC 13.6* 14.9*  NEUTROABS 10.6* 11.9*  HGB 13.5 13.2  HCT 40.1 39.1  MCV 90.3 91.1  PLT 262 254   No results for input(s): LABPROT, INR in the last 72 hours.    Assessment/Plan: Persistent diverticulitis  with failed outpatient Cipro and Flagyl. Afib - on Xarelto  - Personal history of colonic polyp. Last colonoscopy June 2015 by Dr. Oletta [name].  Recommendations -------------------------- - Currently on Cipro and Flagyl Plus Rocephin  - Patient with history of allergy to Ampicillin causing rash (20 years ago)  - Discuss with pharmacy. Appropriate regimen on discharge would be cefdinir 300 mg twice a day along with Flagyl. - Recommend outpatient follow-up in GI clinic after discharge in 4 weeks. - GI will sign off. Call us back if  needed  Corey [name] 06/06/2016, 8:40 AM  Pager [phone]  If no answer or after 5 PM call [phone]

## 2016-06-07 ENCOUNTER — Encounter (HOSPITAL_COMMUNITY): Payer: Self-pay | Admitting: General Practice

## 2016-06-07 DIAGNOSIS — K219 Gastro-esophageal reflux disease without esophagitis: Secondary | ICD-10-CM

## 2016-06-07 LAB — BASIC METABOLIC PANEL
Anion gap: 7 (ref 5–15)
BUN: 9 mg/dL (ref 6–20)
CO2: 25 mmol/L (ref 22–32)
Calcium: 8.5 mg/dL — ABNORMAL LOW (ref 8.9–10.3)
Chloride: 105 mmol/L (ref 101–111)
Creatinine, Ser: 0.85 mg/dL (ref 0.61–1.24)
GFR calc Af Amer: >60 mL/min (ref >60)
GFR calc non Af Amer: >60 mL/min (ref >60)
Glucose, Bld: 104 mg/dL — ABNORMAL HIGH (ref 65–99)
Potassium: 4.2 mmol/L (ref 3.5–5.1)
Sodium: 137 mmol/L (ref 135–145)

## 2016-06-07 LAB — CBC
HCT: 38.8 % — ABNORMAL LOW (ref 39.0–52.0)
Hemoglobin: 12.6 g/dL — ABNORMAL LOW (ref 13.0–17.0)
MCH: 30.1 pg (ref 26.0–34.0)
MCHC: 32.5 g/dL (ref 30.0–36.0)
MCV: 92.8 fL (ref 78.0–100.0)
Platelets: 268 10*3/uL (ref 150–400)
RBC: 4.18 MIL/uL — ABNORMAL LOW (ref 4.22–5.81)
RDW: 14.4 % (ref 11.5–15.5)
WBC: 9.8 10*3/uL (ref 4.0–10.5)

## 2016-06-07 MED ORDER — CALCIUM CARBONATE ANTACID 500 MG PO CHEW: 1.0000 | CHEWABLE_TABLET | Freq: Two times a day (BID) | ORAL | Status: DC | PRN

## 2016-06-07 MED ORDER — CHOLESTYRAMINE LIGHT 4 G PO PACK
4.0000 g | PACK | Freq: Two times a day (BID) | ORAL | Status: AC
  Administered 2016-06-07 – 2016-06-08 (×3): 4 g via ORAL
  Filled 2016-06-07 (×3): qty 1

## 2016-06-07 NOTE — Progress Notes (Signed)
[facility]                                                                              Patient Demographics  Corey [name], is a 70 y.o. male, DOB - [birth year], TKZ:[id number]  Admit date - 06/04/2016   Admitting Physician Shon Millet, DO  Outpatient Primary MD for the patient is [name], MD  Outpatient specialists:   LOS - 3  days    No chief complaint on file.      Brief summary   70 y.o.WM PMHx Recent uncomplicated diverticulitis admitted on 8/28 and discharged 8/30 with cipro/flagyl Po, A Fib on Anticoagulation, OSA on CPAP, HTN, CAD. Patient presented back to ED with worsening abdominal pain, fevers. Patient had an episode of diverticulitis in 2015 and was treated in patient at [facility]. His last colonoscopy was in 2015 which was benign. He reported that a week ago, he started having LLQ crampy abdominal pain which radiates to the RLQ, associated with nausea. He saw his PCP and was placed on cipro/flagyl without much improvement. He came to the ED and was admitted for uncomplicated diverticulitis after CT abd/pelvis was completed. He was treated with IV cipro/flagyl, improved in abdominal pain, was able to eat [place] sandwich and was discharged home. He states that although nauseated and dry heaving, he had been able to keep down his oral cipro/flagyl. He states that since discharge, he has felt worse. He has only has mucus-like stools without anything formed, unable to eat much, worsening LLQ abdominal pain radiating to the RLQ, and fever 100.4 at home. He denied any chest pain, shortness of breath, cough, peripheral edema, bloody stools.    Assessment & Plan   Diverticulitis -Repeat CT abdomen and pelvis showed acute diverticulitis at the mid sigmoid colon similar to CT on 8/28 - GI was consulted and recommended cefdinir with Flagyl.  Continue IV fluid hydration -Normal saline 100 ml/hr -Advance diet to heart healthy [name]) -Follow-up in GI  clinic 4 weeks post discharge - Stool C. difficile obtained yesterday showed C. difficile negative toxin and positive antigen, not acute C. difficile infection, currently patient is tolerating oral diet without any difficulty.  - Stopped PPI. Placed on cholestyramine for 3 doses.   Hyponatremia  -  resolved  -Serum osmolality, urine osmolality not consistent with SIADH -Urine sodium not consistent with SIADH,  Chronic A Fib -Xarelto '20mg'$  daily  -Currently in NSR  Essential HTN -Lisinopril '10mg'$  daily -Toprol '100mg'$  daily   BPH -Flomax 0.'4mg'$  daily   HLD -Lipitor '40mg'$  daily   OSA  -CPAP qhs   Code Status: full code  DVT Prophylaxis Xarelto Family Communication: Discussed in detail with the patient, all imaging results, lab results explained to the patient   Disposition Plan:Hopefully DC home tomorrow if continues to improve and diarrhea improves time Spent in minutes   25 minutes  Procedures:  Ct abd   Consultants:   GI   Antimicrobials:      Medications  Scheduled Meds: . atorvastatin  40 mg Oral Daily  . cefdinir  300 mg Oral BID  . cholestyramine light  4 g Oral BID  .  lisinopril  10 mg Oral Daily  . metoprolol succinate  100 mg Oral Daily  . metroNIDAZOLE  500 mg Oral Q8H  . rivaroxaban  20 mg Oral q1800  . tamsulosin  0.4 mg Oral Daily   Continuous Infusions: . sodium chloride 100 mL/hr at 06/07/16 1002   PRN Meds:.calcium carbonate, morphine injection, ondansetron (ZOFRAN) IV   Antibiotics   Anti-infectives    Start     Dose/Rate Route Frequency Ordered Stop   06/06/16 1400  metroNIDAZOLE (FLAGYL) tablet 500 mg     500 mg Oral Every 8 hours 06/06/16 1130     06/06/16 1230  cefdinir (OMNICEF) 125 MG/5ML suspension 300 mg     300 mg Oral 2 times daily 06/06/16 1130     06/04/16 2300  metroNIDAZOLE (FLAGYL) IVPB 500 mg  Status:  Discontinued     500 mg 100 mL/hr over 60 Minutes Intravenous Every 8 hours 06/04/16 1609 06/06/16 1130    06/04/16 1730  cefTRIAXone (ROCEPHIN) 1 g in dextrose 5 % 50 mL IVPB  Status:  Discontinued     1 g 100 mL/hr over 30 Minutes Intravenous Every 24 hours 06/04/16 1609 06/06/16 1130   06/04/16 1430  ciprofloxacin (CIPRO) IVPB 400 mg     400 mg 200 mL/hr over 60 Minutes Intravenous  Once 06/04/16 1423 06/04/16 1605   06/04/16 1430  metroNIDAZOLE (FLAGYL) IVPB 500 mg     500 mg 100 mL/hr over 60 Minutes Intravenous  Once 06/04/16 1423 06/04/16 1615        Subjective:   Corey [name] was seen and examined today.  Diarrhea yesterday, no fevers.  Patient denies dizziness, chest pain, shortness of breath, abdominal pain, N/V/D/C, new weakness, numbess, tingling. No acute events overnight.    Objective:   Vitals:   06/06/16 0433 06/06/16 1355 06/06/16 2150 06/07/16 0505  BP: 131/65 121/70 (!) 149/76 125/72  Pulse: 70 70 74 62  Resp: '19  18 18  '$ Temp: 99 F (37.2 C) 98.8 F (37.1 C) 98.8 F (37.1 C) 98.6 F (37 C)  TempSrc: Oral Oral Oral Axillary  SpO2: 96% 98% 96% 98%  Weight:      Height:        Intake/Output Summary (Last 24 hours) at 06/07/16 1211 Last data filed at 06/07/16 1638  Gross per 24 hour  Intake             3050 ml  Output                0 ml  Net             3050 ml     Wt Readings from Last 3 Encounters:  06/04/16 119.7 kg (263 lb 12.8 oz)  05/31/16 118.4 kg (261 lb 0.4 oz)  01/05/16 122.5 kg (270 lb)     Exam  General: Alert and oriented x 3, NAD  HEENT:  PERRLA, EOMI, Anicteric Sclera, mucous membranes moist.   Neck: Supple, no JVD, no masses  Cardiovascular: S1 S2 auscultated, no rubs, murmurs or gallops. Regular rate and rhythm.  Respiratory: Clear to auscultation bilaterally, no wheezing, rales or rhonchi  Gastrointestinal: Soft, nontender, nondistended, + bowel sounds  Ext: no cyanosis clubbing or edema  Neuro: AAOx3, Cr N's II- XII. Strength 5/5 upper and lower extremities bilaterally  Skin: No rashes  Psych: Normal affect and  demeanor, alert and oriented x3    Data Reviewed:  I have personally reviewed following labs and imaging studies  Micro Results Recent Results (from the past 240 hour(s))  Urine culture     Status: None   Collection Time: 06/04/16  1:04 PM  Result Value Ref Range Status   Specimen Description URINE, RANDOM  Final   Special Requests NONE  Final   Culture NO GROWTH  Final   Report Status 06/05/2016 FINAL  Final  C difficile quick scan w PCR reflex     Status: Abnormal   Collection Time: 06/06/16  7:44 PM  Result Value Ref Range Status   C Diff antigen POSITIVE (A) NEGATIVE Final   C Diff toxin NEGATIVE NEGATIVE Final   C Diff interpretation Results are indeterminate. See PCR results.  Final  Clostridium Difficile by PCR     Status: Abnormal   Collection Time: 06/06/16  7:44 PM  Result Value Ref Range Status   Toxigenic C Difficile by pcr POSITIVE (A) NEGATIVE Final    Comment: Positive for toxigenic C. difficile with little to no toxin production. Only treat if clinical presentation suggests symptomatic illness.    Radiology Reports Ct Abdomen Pelvis W Contrast  Result Date: 06/04/2016 CLINICAL DATA:  Acute onset of bilateral lower quadrant abdominal pain. Initial encounter. EXAM: CT ABDOMEN AND PELVIS WITH CONTRAST TECHNIQUE: Multidetector CT imaging of the abdomen and pelvis was performed using the standard protocol following bolus administration of intravenous contrast. CONTRAST:  100 mL ISOVUE-300 IOPAMIDOL (ISOVUE-300) INJECTION 61% COMPARISON:  CT of the abdomen and pelvis from 05/31/2016 FINDINGS: Minimal bibasilar atelectasis or scarring is noted. Scattered coronary artery calcifications are seen. The liver and spleen are unremarkable in appearance. The gallbladder is within normal limits. The pancreas and adrenal glands are unremarkable. A small 1.1 cm left renal cyst is noted. There is no evidence of hydronephrosis. No renal or ureteral stones are seen. Nonspecific perinephric  stranding is noted bilaterally. No free fluid is identified. The small bowel is unremarkable in appearance. The stomach is within normal limits. No acute vascular abnormalities are seen. Scattered calcification is seen along the abdominal aorta and its branches. The appendix is not well characterized; there is no evidence of appendicitis. Contrast progresses to the level of the mid transverse colon. There is focal wall thickening along the mid sigmoid colon, with diffuse surrounding soft tissue inflammation and trace free fluid, and underlying scattered diverticulosis, compatible with acute diverticulitis. An underlying mass cannot be entirely excluded. There is no evidence of perforation or abscess formation at this time, though a stool bolus is noted distally. The bladder is mildly distended. Mild bladder wall thickening may reflect the overlying diverticulitis. The prostate remains normal in size. No inguinal lymphadenopathy is seen. No acute osseous abnormalities are identified. There is grade 1 anterolisthesis of L5 on S1, with associated lumbar spinal fusion hardware at L5-S1 and underlying decompression. IMPRESSION: 1. Acute diverticulitis at the mid sigmoid colon, with focal wall thickening, diffuse surrounding soft tissue inflammation and trace free fluid. Underlying scattered diverticulosis noted. Underlying mass cannot be entirely excluded; sigmoidoscopy would be helpful for further evaluation, after completion of treatment for diverticulitis. No evidence of perforation or abscess formation at this time, though a stool bolus is noted distally. 2. Scattered calcification along the abdominal aorta and its branches. 3. Scattered coronary artery calcifications seen. 4. Small left renal cyst noted. Electronically Signed   By: Garald Balding M.D.   On: 06/04/2016 23:14   Ct Abdomen Pelvis W Contrast  Addendum Date: 05/31/2016   ADDENDUM REPORT: 05/31/2016 18:30 ADDENDUM: Impression 1. Should read: Acute  uncomplicated diverticulitis involving the mid and upper sigmoid colon. Electronically Signed   By: Marijo Sanes M.D.   On: 05/31/2016 18:30   Result Date: 05/31/2016 CLINICAL DATA:  Abdominal pain since Friday evening. Prior history of diverticulitis. EXAM: CT ABDOMEN AND PELVIS WITH CONTRAST TECHNIQUE: Multidetector CT imaging of the abdomen and pelvis was performed using the standard protocol following bolus administration of intravenous contrast. CONTRAST:  1 ISOVUE-300 IOPAMIDOL (ISOVUE-300) INJECTION 61% COMPARISON:  CT scan 12/03/2013 FINDINGS: Lower chest: The lung bases are clear of acute process. Minimal dependent subpleural atelectasis. Linear scarring changes but no worrisome lung lesions 10 no pleural effusion. The heart is normal in size. No pericardial effusion. Three-vessel coronary artery calcifications are noted. The distal esophagus is grossly normal. Hepatobiliary: Diffuse and fairly marked fatty infiltration of the liver with a few areas of focal sparing. No worrisome hepatic lesions or intrahepatic biliary dilatation. The gallbladder is normal. No common bile duct dilatation. Pancreas: No mass, inflammation or ductal dilatation. Spleen: Normal size.  No focal lesions. Adrenals/Urinary Tract: The adrenal glands and kidneys are unremarkable. No renal, ureteral or bladder calculi or mass. Stomach/Bowel: The stomach, duodenum and small bowel are unremarkable. No inflammatory changes, mass lesions or obstructive findings. The terminal ileum is normal. There is moderate wall thickening and inflammation involving the mid sigmoid colon consistent with acute diverticulitis. No complicating features such as abscess or free air. Vascular/Lymphatic: Advanced atherosclerotic calcifications involving the aorta. 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 This was present on the prior examination also but appears slightly  larger. No dissection. The branch vessels are patent. Small scattered mesenteric and retroperitoneal lymph nodes but no mass or adenopathy. Other: The prostate gland and seminal vesicles are unremarkable. No pelvic mass or adenopathy. No free pelvic fluid collections. No inguinal mass, adenopathy or hernia. Musculoskeletal: No significant bony findings. L5-S1 fusion changes are noted. IMPRESSION: 1. Acute uncomplicated diverticulosis involving the mid and upper sigmoid colon. 2. Diffuse and fairly marked fatty infiltration of the liver. 3. Advanced atherosclerotic calcifications involving the coronary arteries and abdominal aorta and branch vessels. 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 Followup CT angiogram of the abdomen is suggested in 12 months. Electronically Signed: By: Marijo Sanes M.D. On: 05/31/2016 17:16    Lab Data:  CBC:  Recent Labs Lab 05/31/16 1237 06/01/16 0537 06/04/16 1304 06/05/16 0353 06/07/16 0638  WBC 12.0* 10.6* 13.6* 14.9* 9.8  NEUTROABS  --   --  10.6* 11.9*  --   HGB 14.9 13.6 13.5 13.2 12.6*  HCT 45.0 41.9 40.1 39.1 38.8*  MCV 93.2 94.8 90.3 91.1 92.8  PLT 259 202 262 254 993   Basic Metabolic Panel:  Recent Labs Lab 05/31/16 1237 06/01/16 0537 06/04/16 1304 06/05/16 0353 06/07/16 0638  NA 132* 138 129* 128* 137  K 4.3 4.5 4.0 4.2 4.2  CL 101 104 98* 96* 105  CO2 '22 26 22 23 25  '$ GLUCOSE 111* 104* 107* 103* 104*  BUN 29* '18 11 10 9  '$ CREATININE 1.28* 1.01 1.04 1.00 0.85  CALCIUM 9.2 8.7* 8.7* 8.4* 8.5*  MG  --  2.0  --   --   --   PHOS  --  3.1  --   --   --    GFR: Estimated Creatinine Clearance: 113.5 mL/min (by C-G formula based on SCr of 0.85 mg/dL).  Liver Function Tests:  Recent Labs Lab 05/31/16 1237 06/01/16 0537 06/04/16 1304  AST '24 18 22  '$ ALT 35 27 22  ALKPHOS 43 36* 46  BILITOT 1.4* 1.1 0.9  PROT 7.2 6.1* 6.4*  ALBUMIN 4.3 3.5 3.3*     Recent Labs Lab 05/31/16 1237  LIPASE 26   No results for input(s): AMMONIA in the last 168 hours. Coagulation Profile:  Recent Labs Lab 05/31/16 1512  INR 1.83   Cardiac Enzymes: No results for input(s): CKTOTAL, CKMB, CKMBINDEX, TROPONINI in the last 168 hours. BNP (last 3 results) No results for input(s): PROBNP in the last 8760 hours. HbA1C: No results for input(s): HGBA1C in the last 72 hours. CBG: No results for input(s): GLUCAP in the last 168 hours. Lipid Profile: No results for input(s): CHOL, HDL, LDLCALC, TRIG, CHOLHDL, LDLDIRECT in the last 72 hours. Thyroid Function Tests: No results for input(s): TSH, T4TOTAL, FREET4, T3FREE, THYROIDAB in the last 72 hours. Anemia Panel: No results for input(s): VITAMINB12, FOLATE, FERRITIN, TIBC, IRON, RETICCTPCT in the last 72 hours. Urine analysis:    Component Value Date/Time   COLORURINE AMBER (A) 06/04/2016 1304   APPEARANCEUR CLEAR 06/04/2016 1304   LABSPEC 1.019 06/04/2016 1304   PHURINE 5.5 06/04/2016 1304   GLUCOSEU NEGATIVE 06/04/2016 1304   HGBUR NEGATIVE 06/04/2016 1304   BILIRUBINUR NEGATIVE 06/04/2016 1304   KETONESUR NEGATIVE 06/04/2016 1304   PROTEINUR NEGATIVE 06/04/2016 1304   NITRITE NEGATIVE 06/04/2016 1304   LEUKOCYTESUR SMALL (A) 06/04/2016 1304     Malkie Wille M.D. [facility] 06/07/2016, 12:11 PM  Pager: [phone] Between 7am to 7pm - call Pager - [phone]  After 7pm go to [email or web address] - password [email or web address]  Call night coverage person covering after 7pm

## 2016-06-08 MED ORDER — LOPERAMIDE HCL 2 MG PO CAPS
4.0000 mg | ORAL_CAPSULE | Freq: Once | ORAL | Status: AC
  Administered 2016-06-08: 4 mg via ORAL
  Filled 2016-06-08: qty 2

## 2016-06-08 MED ORDER — CHOLESTYRAMINE LIGHT 4 G PO PACK
4.0000 g | PACK | Freq: Two times a day (BID) | ORAL | Status: AC
  Administered 2016-06-08 – 2016-06-09 (×3): 4 g via ORAL
  Filled 2016-06-08 (×3): qty 1

## 2016-06-08 NOTE — Progress Notes (Signed)
Pt stated that he will self administer CPAP when ready for bed.  RT to monitor and assess as needed.

## 2016-06-08 NOTE — Progress Notes (Signed)
[facility]                                                                              Patient Demographics  Arland Usery, is a 70 y.o. male, DOB - [birth year], QMG:[id number]  Admit date - 06/04/2016   Admitting Physician Shon Millet, DO  Outpatient Primary MD for the patient is [name], MD  Outpatient specialists:   LOS - 4  days    No chief complaint on file.      Brief summary   70 y.o.WM PMHx Recent uncomplicated diverticulitis admitted on 8/28 and discharged 8/30 with cipro/flagyl Po, A Fib on Anticoagulation, OSA on CPAP, HTN, CAD. Patient presented back to ED with worsening abdominal pain, fevers. Patient had an episode of diverticulitis in 2015 and was treated in patient at [facility]. His last colonoscopy was in 2015 which was benign. He reported that a week ago, he started having LLQ crampy abdominal pain which radiates to the RLQ, associated with nausea. He saw his PCP and was placed on cipro/flagyl without much improvement. He came to the ED and was admitted for uncomplicated diverticulitis after CT abd/pelvis was completed. He was treated with IV cipro/flagyl, improved in abdominal pain, was able to eat [place] sandwich and was discharged home. He states that although nauseated and dry heaving, he had been able to keep down his oral cipro/flagyl. He states that since discharge, he has felt worse. He has only has mucus-like stools without anything formed, unable to eat much, worsening LLQ abdominal pain radiating to the RLQ, and fever 100.4 at home. He denied any chest pain, shortness of breath, cough, peripheral edema, bloody stools.    Assessment & Plan   Diverticulitis -Repeat CT abdomen and pelvis showed acute diverticulitis at the mid sigmoid colon similar to CT on 8/28 - GI was consulted and recommended cefdinir with Flagyl.  - DC IV fluids as patient is tolerating solid diet, heart healthy ([place]) -Follow-up in GI clinic 4 weeks post  discharge - Stool C. difficile obtained yesterday showed C. difficile negative toxin and positive antigen, not acute C. difficile infection, currently patient is tolerating oral diet without any difficulty.  - Stopped PPI.  - Placed on cholestyramine for 3 doses, loperamide x1, per patient 4-5 episodes of diarrhea last night and twice this morning.   Hyponatremia  -  resolved  -Serum osmolality, urine osmolality not consistent with SIADH -Urine sodium not consistent with SIADH,  Chronic A Fib -Xarelto '20mg'$  daily  -Currently in NSR  Essential HTN -Lisinopril '10mg'$  daily -Toprol '100mg'$  daily   BPH -Flomax 0.'4mg'$  daily   HLD -Lipitor '40mg'$  daily   OSA  -CPAP qhs   Code Status: full code  DVT Prophylaxis Xarelto Family Communication: Discussed in detail with the patient, all imaging results, lab results explained to the patient   Disposition Plan:Hopefully DC home tomorrow if diarrhea improves time Spent in minutes   25 minutes  Procedures:  Ct abd   Consultants:   GI   Antimicrobials:      Medications  Scheduled Meds: . atorvastatin  40 mg Oral Daily  . cefdinir  300 mg Oral  BID  . cholestyramine light  4 g Oral BID  . lisinopril  10 mg Oral Daily  . loperamide  4 mg Oral Once  . metoprolol succinate  100 mg Oral Daily  . metroNIDAZOLE  500 mg Oral Q8H  . rivaroxaban  20 mg Oral q1800  . tamsulosin  0.4 mg Oral Daily   Continuous Infusions:   PRN Meds:.calcium carbonate, morphine injection, ondansetron (ZOFRAN) IV   Antibiotics   Anti-infectives    Start     Dose/Rate Route Frequency Ordered Stop   06/06/16 1400  metroNIDAZOLE (FLAGYL) tablet 500 mg     500 mg Oral Every 8 hours 06/06/16 1130     06/06/16 1230  cefdinir (OMNICEF) 125 MG/5ML suspension 300 mg     300 mg Oral 2 times daily 06/06/16 1130     06/04/16 2300  metroNIDAZOLE (FLAGYL) IVPB 500 mg  Status:  Discontinued     500 mg 100 mL/hr over 60 Minutes Intravenous Every 8 hours  06/04/16 1609 06/06/16 1130   06/04/16 1730  cefTRIAXone (ROCEPHIN) 1 g in dextrose 5 % 50 mL IVPB  Status:  Discontinued     1 g 100 mL/hr over 30 Minutes Intravenous Every 24 hours 06/04/16 1609 06/06/16 1130   06/04/16 1430  ciprofloxacin (CIPRO) IVPB 400 mg     400 mg 200 mL/hr over 60 Minutes Intravenous  Once 06/04/16 1423 06/04/16 1605   06/04/16 1430  metroNIDAZOLE (FLAGYL) IVPB 500 mg     500 mg 100 mL/hr over 60 Minutes Intravenous  Once 06/04/16 1423 06/04/16 1615        Subjective:   Renzo Vincelette was seen and examined today.  Patient reports loose watery diarrhea overnight 4 -5, and twice this morning. No fevers or chills. Patient denies dizziness, chest pain, shortness of breath, abdominal pain, new weakness, numbess, tingling. No acute events overnight.    Objective:   Vitals:   06/07/16 2114 06/07/16 2227 06/08/16 0635 06/08/16 0941  BP:  (!) 152/76 125/78 132/76  Pulse: (!) 9 62 66 74  Resp: (!) '29 19 18   '$ Temp:  98.4 F (36.9 C) 98.4 F (36.9 C)   TempSrc:  Oral Oral   SpO2: 97% 97% 99% 98%  Weight:      Height:        Intake/Output Summary (Last 24 hours) at 06/08/16 1149 Last data filed at 06/08/16 0647  Gross per 24 hour  Intake          3308.34 ml  Output                0 ml  Net          3308.34 ml     Wt Readings from Last 3 Encounters:  06/04/16 119.7 kg (263 lb 12.8 oz)  05/31/16 118.4 kg (261 lb 0.4 oz)  01/05/16 122.5 kg (270 lb)     Exam  General: Alert and oriented x 3, NAD  HEENT:    Neck:   Cardiovascular: S1 S2 auscultated, no rubs, murmurs or gallops. Regular rate and rhythm.  Respiratory: Clear to auscultation bilaterally, no wheezing, rales or rhonchi  Gastrointestinal: Soft, nontender, nondistended, + bowel sounds  Ext: no cyanosis clubbing or edema  Neuro: AAOx3, Cr N's II- XII. Strength 5/5 upper and lower extremities bilaterally  Skin: No rashes  Psych: Normal affect and demeanor, alert and oriented x3     Data Reviewed:  I have personally reviewed following labs and imaging studies  Micro Results Recent Results (from the past 240 hour(s))  Urine culture     Status: None   Collection Time: 06/04/16  1:04 PM  Result Value Ref Range Status   Specimen Description URINE, RANDOM  Final   Special Requests NONE  Final   Culture NO GROWTH  Final   Report Status 06/05/2016 FINAL  Final  C difficile quick scan w PCR reflex     Status: Abnormal   Collection Time: 06/06/16  7:44 PM  Result Value Ref Range Status   C Diff antigen POSITIVE (A) NEGATIVE Final   C Diff toxin NEGATIVE NEGATIVE Final   C Diff interpretation Results are indeterminate. See PCR results.  Final  Clostridium Difficile by PCR     Status: Abnormal   Collection Time: 06/06/16  7:44 PM  Result Value Ref Range Status   Toxigenic C Difficile by pcr POSITIVE (A) NEGATIVE Final    Comment: Positive for toxigenic C. difficile with little to no toxin production. Only treat if clinical presentation suggests symptomatic illness.    Radiology Reports Ct Abdomen Pelvis W Contrast  Result Date: 06/04/2016 CLINICAL DATA:  Acute onset of bilateral lower quadrant abdominal pain. Initial encounter. EXAM: CT ABDOMEN AND PELVIS WITH CONTRAST TECHNIQUE: Multidetector CT imaging of the abdomen and pelvis was performed using the standard protocol following bolus administration of intravenous contrast. CONTRAST:  100 mL ISOVUE-300 IOPAMIDOL (ISOVUE-300) INJECTION 61% COMPARISON:  CT of the abdomen and pelvis from 05/31/2016 FINDINGS: Minimal bibasilar atelectasis or scarring is noted. Scattered coronary artery calcifications are seen. The liver and spleen are unremarkable in appearance. The gallbladder is within normal limits. The pancreas and adrenal glands are unremarkable. A small 1.1 cm left renal cyst is noted. There is no evidence of hydronephrosis. No renal or ureteral stones are seen. Nonspecific perinephric stranding is noted bilaterally. No  free fluid is identified. The small bowel is unremarkable in appearance. The stomach is within normal limits. No acute vascular abnormalities are seen. Scattered calcification is seen along the abdominal aorta and its branches. The appendix is not well characterized; there is no evidence of appendicitis. Contrast progresses to the level of the mid transverse colon. There is focal wall thickening along the mid sigmoid colon, with diffuse surrounding soft tissue inflammation and trace free fluid, and underlying scattered diverticulosis, compatible with acute diverticulitis. An underlying mass cannot be entirely excluded. There is no evidence of perforation or abscess formation at this time, though a stool bolus is noted distally. The bladder is mildly distended. Mild bladder wall thickening may reflect the overlying diverticulitis. The prostate remains normal in size. No inguinal lymphadenopathy is seen. No acute osseous abnormalities are identified. There is grade 1 anterolisthesis of L5 on S1, with associated lumbar spinal fusion hardware at L5-S1 and underlying decompression. IMPRESSION: 1. Acute diverticulitis at the mid sigmoid colon, with focal wall thickening, diffuse surrounding soft tissue inflammation and trace free fluid. Underlying scattered diverticulosis noted. Underlying mass cannot be entirely excluded; sigmoidoscopy would be helpful for further evaluation, after completion of treatment for diverticulitis. No evidence of perforation or abscess formation at this time, though a stool bolus is noted distally. 2. Scattered calcification along the abdominal aorta and its branches. 3. Scattered coronary artery calcifications seen. 4. Small left renal cyst noted. Electronically Signed   By: Garald Balding M.D.   On: 06/04/2016 23:14   Ct Abdomen Pelvis W Contrast  Addendum Date: 05/31/2016   ADDENDUM REPORT: 05/31/2016 18:30 ADDENDUM: Impression 1. Should read: Acute  uncomplicated diverticulitis involving  the mid and upper sigmoid colon. Electronically Signed   By: Marijo Sanes M.D.   On: 05/31/2016 18:30   Result Date: 05/31/2016 CLINICAL DATA:  Abdominal pain since Friday evening. Prior history of diverticulitis. EXAM: CT ABDOMEN AND PELVIS WITH CONTRAST TECHNIQUE: Multidetector CT imaging of the abdomen and pelvis was performed using the standard protocol following bolus administration of intravenous contrast. CONTRAST:  1 ISOVUE-300 IOPAMIDOL (ISOVUE-300) INJECTION 61% COMPARISON:  CT scan 12/03/2013 FINDINGS: Lower chest: The lung bases are clear of acute process. Minimal dependent subpleural atelectasis. Linear scarring changes but no worrisome lung lesions 10 no pleural effusion. The heart is normal in size. No pericardial effusion. Three-vessel coronary artery calcifications are noted. The distal esophagus is grossly normal. Hepatobiliary: Diffuse and fairly marked fatty infiltration of the liver with a few areas of focal sparing. No worrisome hepatic lesions or intrahepatic biliary dilatation. The gallbladder is normal. No common bile duct dilatation. Pancreas: No mass, inflammation or ductal dilatation. Spleen: Normal size.  No focal lesions. Adrenals/Urinary Tract: The adrenal glands and kidneys are unremarkable. No renal, ureteral or bladder calculi or mass. Stomach/Bowel: The stomach, duodenum and small bowel are unremarkable. No inflammatory changes, mass lesions or obstructive findings. The terminal ileum is normal. There is moderate wall thickening and inflammation involving the mid sigmoid colon consistent with acute diverticulitis. No complicating features such as abscess or free air. Vascular/Lymphatic: Advanced atherosclerotic calcifications involving the aorta. 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 This was present on the prior examination also but appears slightly larger. No dissection. The branch vessels  are patent. Small scattered mesenteric and retroperitoneal lymph nodes but no mass or adenopathy. Other: The prostate gland and seminal vesicles are unremarkable. No pelvic mass or adenopathy. No free pelvic fluid collections. No inguinal mass, adenopathy or hernia. Musculoskeletal: No significant bony findings. L5-S1 fusion changes are noted. IMPRESSION: 1. Acute uncomplicated diverticulosis involving the mid and upper sigmoid colon. 2. Diffuse and fairly marked fatty infiltration of the liver. 3. Advanced atherosclerotic calcifications involving the coronary arteries and abdominal aorta and branch vessels. 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 Followup CT angiogram of the abdomen is suggested in 12 months. Electronically Signed: By: Marijo Sanes M.D. On: 05/31/2016 17:16    Lab Data:  CBC:  Recent Labs Lab 06/04/16 1304 06/05/16 0353 06/07/16 0638  WBC 13.6* 14.9* 9.8  NEUTROABS 10.6* 11.9*  --   HGB 13.5 13.2 12.6*  HCT 40.1 39.1 38.8*  MCV 90.3 91.1 92.8  PLT 262 254 233   Basic Metabolic Panel:  Recent Labs Lab 06/04/16 1304 06/05/16 0353 06/07/16 0638  NA 129* 128* 137  K 4.0 4.2 4.2  CL 98* 96* 105  CO2 '22 23 25  '$ GLUCOSE 107* 103* 104*  BUN '11 10 9  '$ CREATININE 1.04 1.00 0.85  CALCIUM 8.7* 8.4* 8.5*   GFR: Estimated Creatinine Clearance: 113.5 mL/min (by C-G formula based on SCr of 0.85 mg/dL). Liver Function Tests:  Recent Labs Lab 06/04/16 1304  AST 22  ALT 22  ALKPHOS 46  BILITOT 0.9  PROT 6.4*  ALBUMIN 3.3*   No results for input(s): LIPASE, AMYLASE in the last 168 hours. No results for input(s): AMMONIA in the last 168 hours. Coagulation Profile: No results for input(s): INR, PROTIME in the last 168 hours. Cardiac Enzymes: No results for input(s):  CKTOTAL, CKMB, CKMBINDEX, TROPONINI in the last 168 hours. BNP (last 3 results) No results for input(s): PROBNP in  the last 8760 hours. HbA1C: No results for input(s): HGBA1C in the last 72 hours. CBG: No results for input(s): GLUCAP in the last 168 hours. Lipid Profile: No results for input(s): CHOL, HDL, LDLCALC, TRIG, CHOLHDL, LDLDIRECT in the last 72 hours. Thyroid Function Tests: No results for input(s): TSH, T4TOTAL, FREET4, T3FREE, THYROIDAB in the last 72 hours. Anemia Panel: No results for input(s): VITAMINB12, FOLATE, FERRITIN, TIBC, IRON, RETICCTPCT in the last 72 hours. Urine analysis:    Component Value Date/Time   COLORURINE AMBER (A) 06/04/2016 1304   APPEARANCEUR CLEAR 06/04/2016 1304   LABSPEC 1.019 06/04/2016 1304   PHURINE 5.5 06/04/2016 1304   GLUCOSEU NEGATIVE 06/04/2016 1304   HGBUR NEGATIVE 06/04/2016 1304   BILIRUBINUR NEGATIVE 06/04/2016 1304   KETONESUR NEGATIVE 06/04/2016 1304   PROTEINUR NEGATIVE 06/04/2016 1304   NITRITE NEGATIVE 06/04/2016 1304   LEUKOCYTESUR SMALL (A) 06/04/2016 1304     Boby Eyer M.D. [facility] 06/08/2016, 11:49 AM  Pager: [phone] Between 7am to 7pm - call Pager - [phone]  After 7pm go to [email or web address] - password [email or web address]  Call night coverage person covering after 7pm

## 2016-06-09 DIAGNOSIS — I480 Paroxysmal atrial fibrillation: Secondary | ICD-10-CM

## 2016-06-09 DIAGNOSIS — K5732 Diverticulitis of large intestine without perforation or abscess without bleeding: Principal | ICD-10-CM

## 2016-06-09 DIAGNOSIS — R197 Diarrhea, unspecified: Secondary | ICD-10-CM

## 2016-06-09 LAB — BASIC METABOLIC PANEL
Anion gap: 9 (ref 5–15)
BUN: 8 mg/dL (ref 6–20)
CO2: 24 mmol/L (ref 22–32)
Calcium: 9 mg/dL (ref 8.9–10.3)
Chloride: 104 mmol/L (ref 101–111)
Creatinine, Ser: 0.82 mg/dL (ref 0.61–1.24)
GFR calc Af Amer: >60 mL/min (ref >60)
GFR calc non Af Amer: >60 mL/min (ref >60)
Glucose, Bld: 109 mg/dL — ABNORMAL HIGH (ref 65–99)
Potassium: 4.1 mmol/L (ref 3.5–5.1)
Sodium: 137 mmol/L (ref 135–145)

## 2016-06-09 MED ORDER — LOPERAMIDE HCL 2 MG PO CAPS
4.0000 mg | ORAL_CAPSULE | Freq: Once | ORAL | Status: AC
  Administered 2016-06-09: 4 mg via ORAL
  Filled 2016-06-09: qty 2

## 2016-06-09 MED ORDER — LOPERAMIDE HCL 2 MG PO CAPS
2.0000 mg | ORAL_CAPSULE | Freq: Three times a day (TID) | ORAL | Status: DC | PRN
  Administered 2016-06-09 – 2016-06-10 (×2): 2 mg via ORAL
  Filled 2016-06-09 (×2): qty 1

## 2016-06-09 NOTE — Progress Notes (Signed)
Patient states he places self on and off of CPAP when ready. RT informed patient if he has any trouble have RN contact RT.

## 2016-06-09 NOTE — Progress Notes (Signed)
[facility]                                                                              Patient Demographics  Kevontay Burks, is a 70 y.o. male, DOB - Apr 04, 1946, JYN:[id number]  Admit date - 06/04/2016   Admitting Physician Shon Millet, DO  Outpatient Primary MD for the patient is KOIRALA,DIBAS, MD   LOS - 5  days    Diverticulitis     Brief summary   70 y.o.WM PMHx Recent uncomplicated diverticulitis admitted on 8/28 and discharged 8/30 with cipro/flagyl Po, A Fib on Anticoagulation, OSA on CPAP, HTN, CAD. Patient presented back to ED with worsening abdominal pain, fevers. Patient had an episode of diverticulitis in 2015 and was treated in patient at [facility]. His last colonoscopy was in 2015 which was benign. He reported that a week ago, he started having LLQ crampy abdominal pain which radiates to the RLQ, associated with nausea. He saw his PCP and was placed on cipro/flagyl without much improvement. He came to the ED and was admitted for uncomplicated diverticulitis after CT abd/pelvis was completed. He was treated with IV cipro/flagyl, improved in abdominal pain, was able to eat [place] sandwich and was discharged home. He states that although nauseated and dry heaving, he had been able to keep down his oral cipro/flagyl. He states that since discharge, he has felt worse. He has only has mucus-like stools without anything formed, unable to eat much, worsening LLQ abdominal pain radiating to the RLQ, and fever 100.4 at home. He denied any chest pain, shortness of breath, cough, peripheral edema, bloody stools.    Assessment & Plan   Diverticulitis -Repeat CT abdomen and pelvis showed acute diverticulitis at the mid sigmoid colon similar to CT on 8/28 - GI was consulted and recommended cefdinir with Flagyl.  - Patient is tolerating a diet -Follow-up in GI clinic 4 weeks post discharge - Stool C. difficile showed C. difficile negative toxin and positive  antigen. C. difficile PCR was positive. Not thought to be clinically significant infection. Patient is on oral Flagyl currently. Continue to monitor for now. - Stopped PPI.  - Patient continues to have multiple loose stools. He is now on cholestyramine. He did show improvement with Imodium, which will be ordered for today as well.   Hyponatremia  -  resolved  -Serum osmolality, urine osmolality not consistent with SIADH -Urine sodium not consistent with SIADH,  Chronic A Fib -Xarelto '20mg'$  daily  -Currently in NSR  Essential HTN -Lisinopril '10mg'$  daily -Toprol '100mg'$  daily   BPH -Flomax 0.'4mg'$  daily   HLD -Lipitor '40mg'$  daily   OSA  -CPAP qhs   Code Status: full code  DVT Prophylaxis: Xarelto Family Communication: Discussed with patient Disposition Plan: Await diarrhea to subside. Anticipate discharge in 1-2 days.  Procedures:  Ct abd   Consultants:   GI   Antimicrobials:      Medications  Scheduled Meds: . atorvastatin  40 mg Oral Daily  . cefdinir  300 mg Oral BID  . cholestyramine light  4 g Oral BID  . lisinopril  10 mg Oral Daily  . loperamide  4 mg Oral Once  . metoprolol succinate  100 mg Oral Daily  . metroNIDAZOLE  500 mg Oral Q8H  . rivaroxaban  20 mg Oral q1800  . tamsulosin  0.4 mg Oral Daily   Continuous Infusions:   PRN Meds:.calcium carbonate, loperamide, morphine injection, ondansetron (ZOFRAN) IV   Antibiotics   Anti-infectives    Start     Dose/Rate Route Frequency Ordered Stop   06/06/16 1400  metroNIDAZOLE (FLAGYL) tablet 500 mg     500 mg Oral Every 8 hours 06/06/16 1130     06/06/16 1230  cefdinir (OMNICEF) 125 MG/5ML suspension 300 mg     300 mg Oral 2 times daily 06/06/16 1130     06/04/16 2300  metroNIDAZOLE (FLAGYL) IVPB 500 mg  Status:  Discontinued     500 mg 100 mL/hr over 60 Minutes Intravenous Every 8 hours 06/04/16 1609 06/06/16 1130   06/04/16 1730  cefTRIAXone (ROCEPHIN) 1 g in dextrose 5 % 50 mL IVPB   Status:  Discontinued     1 g 100 mL/hr over 30 Minutes Intravenous Every 24 hours 06/04/16 1609 06/06/16 1130   06/04/16 1430  ciprofloxacin (CIPRO) IVPB 400 mg     400 mg 200 mL/hr over 60 Minutes Intravenous  Once 06/04/16 1423 06/04/16 1605   06/04/16 1430  metroNIDAZOLE (FLAGYL) IVPB 500 mg     500 mg 100 mL/hr over 60 Minutes Intravenous  Once 06/04/16 1423 06/04/16 1615        Subjective:   Patient feels well except that he is having multiple BMs. He's had 4 loose stools since 6 PM last night. He has had 10 over the last 24 hours. Some of these are small. Others are medium to large in amount. He denies any abdominal pain, nausea or vomiting.   Objective:   Vitals:   06/08/16 1755 06/08/16 2142 06/08/16 2153 06/09/16 0550  BP:  132/74  140/72  Pulse:  65  67  Resp:  '18 18 18  '$ Temp: 98.6 F (37 C) 98.8 F (37.1 C)  98.2 F (36.8 C)  TempSrc:  Oral  Oral  SpO2:  99%  97%  Weight:      Height:        Intake/Output Summary (Last 24 hours) at 06/09/16 1020 Last data filed at 06/09/16 0907  Gross per 24 hour  Intake              660 ml  Output                0 ml  Net              660 ml     Wt Readings from Last 3 Encounters:  06/04/16 119.7 kg (263 lb 12.8 oz)  05/31/16 118.4 kg (261 lb 0.4 oz)  01/05/16 122.5 kg (270 lb)     Exam  General: Alert and oriented x 3, NAD  Cardiovascular: S1 S2 normal regular, no rubs, murmurs or gallops. Regular rate and rhythm.  Respiratory: Clear to auscultation bilaterally, no wheezing, rales or rhonchi  Gastrointestinal: Soft, nontender, nondistended, + bowel sounds   Data Reviewed:  I have personally reviewed following labs and imaging studies  Micro Results Recent Results (from the past 240 hour(s))  Urine culture     Status: None   Collection Time: 06/04/16  1:04 PM  Result Value Ref Range Status   Specimen Description URINE, RANDOM  Final   Special Requests NONE  Final  Culture NO GROWTH  Final   Report  Status 06/05/2016 FINAL  Final  C difficile quick scan w PCR reflex     Status: Abnormal   Collection Time: 06/06/16  7:44 PM  Result Value Ref Range Status   C Diff antigen POSITIVE (A) NEGATIVE Final   C Diff toxin NEGATIVE NEGATIVE Final   C Diff interpretation Results are indeterminate. See PCR results.  Final  Clostridium Difficile by PCR     Status: Abnormal   Collection Time: 06/06/16  7:44 PM  Result Value Ref Range Status   Toxigenic C Difficile by pcr POSITIVE (A) NEGATIVE Final    Comment: Positive for toxigenic C. difficile with little to no toxin production. Only treat if clinical presentation suggests symptomatic illness.    Radiology Reports Ct Abdomen Pelvis W Contrast  Result Date: 06/04/2016 CLINICAL DATA:  Acute onset of bilateral lower quadrant abdominal pain. Initial encounter. EXAM: CT ABDOMEN AND PELVIS WITH CONTRAST TECHNIQUE: Multidetector CT imaging of the abdomen and pelvis was performed using the standard protocol following bolus administration of intravenous contrast. CONTRAST:  100 mL ISOVUE-300 IOPAMIDOL (ISOVUE-300) INJECTION 61% COMPARISON:  CT of the abdomen and pelvis from 05/31/2016 FINDINGS: Minimal bibasilar atelectasis or scarring is noted. Scattered coronary artery calcifications are seen. The liver and spleen are unremarkable in appearance. The gallbladder is within normal limits. The pancreas and adrenal glands are unremarkable. A small 1.1 cm left renal cyst is noted. There is no evidence of hydronephrosis. No renal or ureteral stones are seen. Nonspecific perinephric stranding is noted bilaterally. No free fluid is identified. The small bowel is unremarkable in appearance. The stomach is within normal limits. No acute vascular abnormalities are seen. Scattered calcification is seen along the abdominal aorta and its branches. The appendix is not well characterized; there is no evidence of appendicitis. Contrast progresses to the level of the mid transverse  colon. There is focal wall thickening along the mid sigmoid colon, with diffuse surrounding soft tissue inflammation and trace free fluid, and underlying scattered diverticulosis, compatible with acute diverticulitis. An underlying mass cannot be entirely excluded. There is no evidence of perforation or abscess formation at this time, though a stool bolus is noted distally. The bladder is mildly distended. Mild bladder wall thickening may reflect the overlying diverticulitis. The prostate remains normal in size. No inguinal lymphadenopathy is seen. No acute osseous abnormalities are identified. There is grade 1 anterolisthesis of L5 on S1, with associated lumbar spinal fusion hardware at L5-S1 and underlying decompression. IMPRESSION: 1. Acute diverticulitis at the mid sigmoid colon, with focal wall thickening, diffuse surrounding soft tissue inflammation and trace free fluid. Underlying scattered diverticulosis noted. Underlying mass cannot be entirely excluded; sigmoidoscopy would be helpful for further evaluation, after completion of treatment for diverticulitis. No evidence of perforation or abscess formation at this time, though a stool bolus is noted distally. 2. Scattered calcification along the abdominal aorta and its branches. 3. Scattered coronary artery calcifications seen. 4. Small left renal cyst noted. Electronically Signed   By: Garald Balding M.D.   On: 06/04/2016 23:14   Ct Abdomen Pelvis W Contrast  Addendum Date: 05/31/2016   ADDENDUM REPORT: 05/31/2016 18:30 ADDENDUM: Impression 1. Should read: Acute uncomplicated diverticulitis involving the mid and upper sigmoid colon. Electronically Signed   By: Marijo Sanes M.D.   On: 05/31/2016 18:30   Result Date: 05/31/2016 CLINICAL DATA:  Abdominal pain since Friday evening. Prior history of diverticulitis. EXAM: CT ABDOMEN AND PELVIS WITH CONTRAST TECHNIQUE:  Multidetector CT imaging of the abdomen and pelvis was performed using the standard  protocol following bolus administration of intravenous contrast. CONTRAST:  1 ISOVUE-300 IOPAMIDOL (ISOVUE-300) INJECTION 61% COMPARISON:  CT scan 12/03/2013 FINDINGS: Lower chest: The lung bases are clear of acute process. Minimal dependent subpleural atelectasis. Linear scarring changes but no worrisome lung lesions 10 no pleural effusion. The heart is normal in size. No pericardial effusion. Three-vessel coronary artery calcifications are noted. The distal esophagus is grossly normal. Hepatobiliary: Diffuse and fairly marked fatty infiltration of the liver with a few areas of focal sparing. No worrisome hepatic lesions or intrahepatic biliary dilatation. The gallbladder is normal. No common bile duct dilatation. Pancreas: No mass, inflammation or ductal dilatation. Spleen: Normal size.  No focal lesions. Adrenals/Urinary Tract: The adrenal glands and kidneys are unremarkable. No renal, ureteral or bladder calculi or mass. Stomach/Bowel: The stomach, duodenum and small bowel are unremarkable. No inflammatory changes, mass lesions or obstructive findings. The terminal ileum is normal. There is moderate wall thickening and inflammation involving the mid sigmoid colon consistent with acute diverticulitis. No complicating features such as abscess or free air. Vascular/Lymphatic: Advanced atherosclerotic calcifications involving the aorta. 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 This was present on the prior examination also but appears slightly larger. No dissection. The branch vessels are patent. Small scattered mesenteric and retroperitoneal lymph nodes but no mass or adenopathy. Other: The prostate gland and seminal vesicles are unremarkable. No pelvic mass or adenopathy. No free pelvic fluid collections. No inguinal mass, adenopathy or hernia. Musculoskeletal: No significant bony findings. L5-S1 fusion changes are noted.  IMPRESSION: 1. Acute uncomplicated diverticulosis involving the mid and upper sigmoid colon. 2. Diffuse and fairly marked fatty infiltration of the liver. 3. Advanced atherosclerotic calcifications involving the coronary arteries and abdominal aorta and branch vessels. 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 Followup CT angiogram of the abdomen is suggested in 12 months. Electronically Signed: By: Marijo Sanes M.D. On: 05/31/2016 17:16    Lab Data:  CBC:  Recent Labs Lab 06/04/16 1304 06/05/16 0353 06/07/16 0638  WBC 13.6* 14.9* 9.8  NEUTROABS 10.6* 11.9*  --   HGB 13.5 13.2 12.6*  HCT 40.1 39.1 38.8*  MCV 90.3 91.1 92.8  PLT 262 254 681   Basic Metabolic Panel:  Recent Labs Lab 06/04/16 1304 06/05/16 0353 06/07/16 0638  NA 129* 128* 137  K 4.0 4.2 4.2  CL 98* 96* 105  CO2 '22 23 25  '$ GLUCOSE 107* 103* 104*  BUN '11 10 9  '$ CREATININE 1.04 1.00 0.85  CALCIUM 8.7* 8.4* 8.5*   GFR: Estimated Creatinine Clearance: 113.5 mL/min (by C-G formula based on SCr of 0.85 mg/dL). Liver Function Tests:  Recent Labs Lab 06/04/16 1304  AST 22  ALT 22  ALKPHOS 46  BILITOT 0.9  PROT 6.4*  ALBUMIN 3.3*   Urine analysis:    Component Value Date/Time   COLORURINE AMBER (A) 06/04/2016 1304   APPEARANCEUR CLEAR 06/04/2016 1304   LABSPEC 1.019 06/04/2016 1304   PHURINE 5.5 06/04/2016 1304   GLUCOSEU NEGATIVE 06/04/2016 1304   HGBUR NEGATIVE 06/04/2016 1304   BILIRUBINUR NEGATIVE 06/04/2016 1304   KETONESUR NEGATIVE 06/04/2016 1304   PROTEINUR NEGATIVE 06/04/2016 1304   NITRITE NEGATIVE 06/04/2016 1304   LEUKOCYTESUR SMALL (A) 06/04/2016 1304     Deandra Goering M.D. [facility] 06/09/2016, 10:20 AM  Pager: [phone] Between 7am to 7pm - call Pager - [phone]  After 7pm go to [email or web address] - password [email or web address]  Call night coverage person covering after 7pm

## 2016-06-10 MED ORDER — CEFDINIR 300 MG PO CAPS: 300.0000 mg | ORAL_CAPSULE | Freq: Two times a day (BID) | ORAL | 0 refills | Status: DC

## 2016-06-10 MED ORDER — LOPERAMIDE HCL 2 MG PO CAPS: 2.0000 mg | ORAL_CAPSULE | Freq: Three times a day (TID) | ORAL | 0 refills | Status: DC | PRN

## 2016-06-10 MED ORDER — METRONIDAZOLE 500 MG PO TABS: 500.0000 mg | ORAL_TABLET | Freq: Three times a day (TID) | ORAL | 0 refills | Status: DC

## 2016-06-10 NOTE — Discharge Instructions (Signed)
Diverticulitis Diverticulitis is when small pockets that have formed in your colon (large intestine) become infected or swollen. HOME CARE  Follow your doctor's instructions.  Follow a special diet if told by your doctor.  When you feel better, your doctor may tell you to change your diet. You may be told to eat a lot of fiber. Fruits and vegetables are good sources of fiber. Fiber makes it easier to poop (have bowel movements).  Take supplements or probiotics as told by your doctor.  Only take medicines as told by your doctor.  Keep all follow-up visits with your doctor. GET HELP IF:  Your pain does not get better.  You have a hard time eating food.  You are not pooping like normal. GET HELP RIGHT AWAY IF:  Your pain gets worse.  Your problems do not get better.  Your problems suddenly get worse.  You have a fever.  You keep throwing up (vomiting).  You have bloody or black, tarry poop (stool). MAKE SURE YOU:   Understand these instructions.  Will watch your condition.  Will get help right away if you are not doing well or get worse.   This information is not intended to replace advice given to you by your health care provider. Make sure you discuss any questions you have with your health care provider.   Document Released: 03/08/2008 Document Revised: 09/25/2013 Document Reviewed: 08/15/2013 Elsevier Interactive Patient Education [employer].

## 2016-06-10 NOTE — Discharge Summary (Signed)
[facility]  Physician Discharge Summary   Patient ID: Corey [name] MRN: [id number] DOB/AGE: 07-Nov-1945 70 y.o.  Admit date: 06/04/2016 Discharge date: 06/10/2016  PCP: [name], MD  DISCHARGE DIAGNOSES:  Principal Problem:   Diverticulitis Active Problems:   Benign essential HTN   Dyslipidemia   GERD (gastroesophageal reflux disease)   Atrial fibrillation (HCC)   OSA (obstructive sleep apnea)   Hyponatremia   Diverticulitis of intestine without perforation or abscess without bleeding   RECOMMENDATIONS FOR OUTPATIENT FOLLOW UP: 1. Out patient follow-up with PCP and gastroenterology   DISCHARGE CONDITION: fair  Diet recommendation: High-fiber diet  Filed Weights   06/04/16 1244 06/04/16 1559  Weight: 118.4 kg (261 lb) 119.7 kg (263 lb 12.8 oz)    INITIAL HISTORY: 70 y.o.WM PMHx Recent uncomplicated diverticulitis admitted on 8/28 and discharged 8/30 with cipro/flagylPo, A Fib on Anticoagulation, OSA on CPAP, HTN, CAD. Patient presented back to ED with worsening abdominal pain, fevers. Patient had an episode of diverticulitis in 2015 and was treated in patient at [facility]. His last colonoscopy was in 2015 which was benign. He reported that a week ago, he started having LLQ crampy abdominal pain which radiates to the RLQ, associated with nausea. He saw his PCP and was placed on cipro/flagyl without much improvement. He came to the ED and was admitted for uncomplicated diverticulitis after CT abd/pelvis was completed. He was treated with IV cipro/flagyl, improved in abdominal pain, was able to eat [place] sandwich and was discharged home. He states that although nauseated and dry heaving, he had been able to keep down his oral cipro/flagyl. He states that since discharge, he has felt worse. He has only has mucus-like stools without anything formed, unable to eat much, worsening LLQ abdominal pain radiating to the RLQ, and fever 100.4 at home. He denied any chest pain,  shortness of breath, cough, peripheral edema, bloody stools.   Consultations:  [facility] COURSE:   Acute Diverticulitis Patient was admitted to the hospital with worsening abdominal pain. He was recently discharged after being managed for diverticulitis and was placed on oral antibiotics. Patient was admitted to the hospital. He underwent another CT scan, which showed diverticulitis as seen before. Gastroenterology was consulted. They recommended changing the Cipro to Ceftin ear and continuing the Flagyl. He started improving. He did have loose stools. C. difficile was ordered which showed negative toxin and positive antigen. C. difficile PCR was positive. The significance of this test is unclear. However, patient started improving on oral Flagyl. His frequency of bowel movements have improved. His stool is more formed. He has had only 5 bowel movements in the last 24 hours. He feels much better and wishes to go home. He will follow-up with gastroenterology in 4 weeks. Follow-up with PCP in one week.  Hyponatremia  This has resolved. Likely due to hypovolemia.  Chronic A Fib ContinueXarelto '20mg'$  daily. Currently in NSR  Essential HTN Blood pressures are stable. Continue home medications.  BPH Continue Flomax 0.'4mg'$  daily   Hyperlipidemia  Continue Lipitor '40mg'$  daily   OSA  Continue CPAP qhs   Overall, stable and improved. Okay for discharge today.  PERTINENT LABS:  The results of significant diagnostics from this hospitalization (including imaging, microbiology, ancillary and laboratory) are listed below for reference.    Microbiology: Recent Results (from the past 240 hour(s))  Urine culture     Status: None   Collection Time: 06/04/16  1:04 PM  Result Value Ref Range Status  Specimen Description URINE, RANDOM  Final   Special Requests NONE  Final   Culture NO GROWTH  Final   Report Status 06/05/2016 FINAL  Final  C difficile quick scan w  PCR reflex     Status: Abnormal   Collection Time: 06/06/16  7:44 PM  Result Value Ref Range Status   C Diff antigen POSITIVE (A) NEGATIVE Final   C Diff toxin NEGATIVE NEGATIVE Final   C Diff interpretation Results are indeterminate. See PCR results.  Final  Clostridium Difficile by PCR     Status: Abnormal   Collection Time: 06/06/16  7:44 PM  Result Value Ref Range Status   Toxigenic C Difficile by pcr POSITIVE (A) NEGATIVE Final    Comment: Positive for toxigenic C. difficile with little to no toxin production. Only treat if clinical presentation suggests symptomatic illness.     Labs: Basic Metabolic Panel:  Recent Labs Lab 06/04/16 1304 06/05/16 0353 06/07/16 0638 06/09/16 1254  NA 129* 128* 137 137  K 4.0 4.2 4.2 4.1  CL 98* 96* 105 104  CO2 '22 23 25 24  '$ GLUCOSE 107* 103* 104* 109*  BUN '11 10 9 8  '$ CREATININE 1.04 1.00 0.85 0.82  CALCIUM 8.7* 8.4* 8.5* 9.0   Liver Function Tests:  Recent Labs Lab 06/04/16 1304  AST 22  ALT 22  ALKPHOS 46  BILITOT 0.9  PROT 6.4*  ALBUMIN 3.3*   CBC:  Recent Labs Lab 06/04/16 1304 06/05/16 0353 06/07/16 0638  WBC 13.6* 14.9* 9.8  NEUTROABS 10.6* 11.9*  --   HGB 13.5 13.2 12.6*  HCT 40.1 39.1 38.8*  MCV 90.3 91.1 92.8  PLT 262 254 268    IMAGING STUDIES Ct Abdomen Pelvis W Contrast  Result Date: 06/04/2016 CLINICAL DATA:  Acute onset of bilateral lower quadrant abdominal pain. Initial encounter. EXAM: CT ABDOMEN AND PELVIS WITH CONTRAST TECHNIQUE: Multidetector CT imaging of the abdomen and pelvis was performed using the standard protocol following bolus administration of intravenous contrast. CONTRAST:  100 mL ISOVUE-300 IOPAMIDOL (ISOVUE-300) INJECTION 61% COMPARISON:  CT of the abdomen and pelvis from 05/31/2016 FINDINGS: Minimal bibasilar atelectasis or scarring is noted. Scattered coronary artery calcifications are seen. The liver and spleen are unremarkable in appearance. The gallbladder is within normal limits.  The pancreas and adrenal glands are unremarkable. A small 1.1 cm left renal cyst is noted. There is no evidence of hydronephrosis. No renal or ureteral stones are seen. Nonspecific perinephric stranding is noted bilaterally. No free fluid is identified. The small bowel is unremarkable in appearance. The stomach is within normal limits. No acute vascular abnormalities are seen. Scattered calcification is seen along the abdominal aorta and its branches. The appendix is not well characterized; there is no evidence of appendicitis. Contrast progresses to the level of the mid transverse colon. There is focal wall thickening along the mid sigmoid colon, with diffuse surrounding soft tissue inflammation and trace free fluid, and underlying scattered diverticulosis, compatible with acute diverticulitis. An underlying mass cannot be entirely excluded. There is no evidence of perforation or abscess formation at this time, though a stool bolus is noted distally. The bladder is mildly distended. Mild bladder wall thickening may reflect the overlying diverticulitis. The prostate remains normal in size. No inguinal lymphadenopathy is seen. No acute osseous abnormalities are identified. There is grade 1 anterolisthesis of L5 on S1, with associated lumbar spinal fusion hardware at L5-S1 and underlying decompression. IMPRESSION: 1. Acute diverticulitis at the mid sigmoid colon, with focal wall  thickening, diffuse surrounding soft tissue inflammation and trace free fluid. Underlying scattered diverticulosis noted. Underlying mass cannot be entirely excluded; sigmoidoscopy would be helpful for further evaluation, after completion of treatment for diverticulitis. No evidence of perforation or abscess formation at this time, though a stool bolus is noted distally. 2. Scattered calcification along the abdominal aorta and its branches. 3. Scattered coronary artery calcifications seen. 4. Small left renal cyst noted. Electronically Signed    By: Garald Balding M.D.   On: 06/04/2016 23:14   Ct Abdomen Pelvis W Contrast  Addendum Date: 05/31/2016   ADDENDUM REPORT: 05/31/2016 18:30 ADDENDUM: Impression 1. Should read: Acute uncomplicated diverticulitis involving the mid and upper sigmoid colon. Electronically Signed   By: Marijo Sanes M.D.   On: 05/31/2016 18:30   Result Date: 05/31/2016 CLINICAL DATA:  Abdominal pain since Friday evening. Prior history of diverticulitis. EXAM: CT ABDOMEN AND PELVIS WITH CONTRAST TECHNIQUE: Multidetector CT imaging of the abdomen and pelvis was performed using the standard protocol following bolus administration of intravenous contrast. CONTRAST:  1 ISOVUE-300 IOPAMIDOL (ISOVUE-300) INJECTION 61% COMPARISON:  CT scan 12/03/2013 FINDINGS: Lower chest: The lung bases are clear of acute process. Minimal dependent subpleural atelectasis. Linear scarring changes but no worrisome lung lesions 10 no pleural effusion. The heart is normal in size. No pericardial effusion. Three-vessel coronary artery calcifications are noted. The distal esophagus is grossly normal. Hepatobiliary: Diffuse and fairly marked fatty infiltration of the liver with a few areas of focal sparing. No worrisome hepatic lesions or intrahepatic biliary dilatation. The gallbladder is normal. No common bile duct dilatation. Pancreas: No mass, inflammation or ductal dilatation. Spleen: Normal size.  No focal lesions. Adrenals/Urinary Tract: The adrenal glands and kidneys are unremarkable. No renal, ureteral or bladder calculi or mass. Stomach/Bowel: The stomach, duodenum and small bowel are unremarkable. No inflammatory changes, mass lesions or obstructive findings. The terminal ileum is normal. There is moderate wall thickening and inflammation involving the mid sigmoid colon consistent with acute diverticulitis. No complicating features such as abscess or free air. Vascular/Lymphatic: Advanced atherosclerotic calcifications involving the aorta. Small  penetrating ulcer versus small saccular aneurysm involving the anterior wall of the abdominal aorta near the IMA origin. This was present on the prior examination also but appears slightly larger. No dissection. The branch vessels are patent. Small scattered mesenteric and retroperitoneal lymph nodes but no mass or adenopathy. Other: The prostate gland and seminal vesicles are unremarkable. No pelvic mass or adenopathy. No free pelvic fluid collections. No inguinal mass, adenopathy or hernia. Musculoskeletal: No significant bony findings. L5-S1 fusion changes are noted. IMPRESSION: 1. Acute uncomplicated diverticulosis involving the mid and upper sigmoid colon. 2. Diffuse and fairly marked fatty infiltration of the liver. 3. Advanced atherosclerotic calcifications involving the coronary arteries and abdominal aorta and branch vessels. 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 Followup CT angiogram of the abdomen is suggested in 12 months. Electronically Signed: By: Marijo Sanes M.D. On: 05/31/2016 17:16    DISCHARGE EXAMINATION: Vitals:   06/09/16 1551 06/09/16 2143 06/10/16 0446 06/10/16 0842  BP: 132/75 132/69 132/74 125/80  Pulse: 64 63 63 64  Resp: '18 18 18   '$ Temp: 98.4 F (36.9 C) 98.6 F (37 C) 98 F (36.7 C)   TempSrc: Oral Oral Oral   SpO2: 98% 96% 96% 97%  Weight:      Height:       General appearance: alert, cooperative, appears stated  age and no distress Resp: clear to auscultation bilaterally Cardio: regular rate and rhythm, S1, S2 normal, no murmur, click, rub or gallop GI: soft, non-tender; bowel sounds normal; no masses,  no organomegaly Extremities: extremities normal, atraumatic, no cyanosis or edema Neurologic: Alert and oriented X 3, normal strength and tone. Normal symmetric reflexes. Normal coordination and gait  DISPOSITION: Home  Discharge Instructions    Call MD for:  difficulty  breathing, headache or visual disturbances    Complete by:  As directed   Call MD for:  extreme fatigue    Complete by:  As directed   Call MD for:  persistant dizziness or light-headedness    Complete by:  As directed   Call MD for:  persistant nausea and vomiting    Complete by:  As directed   Call MD for:  severe uncontrolled pain    Complete by:  As directed   Call MD for:  temperature >100.4    Complete by:  As directed   Discharge instructions    Complete by:  As directed   Please follow up with PCP in 1 week and with Dr. Oletta Lamas in 4 weeks. Soft bland diet for 4-5 days and then back to usual with high fiber.   You were cared for by a hospitalist during your hospital stay. If you have any questions about your discharge medications or the care you received while you were in the hospital after you are discharged, you can call the unit and asked to speak with the hospitalist on call if the hospitalist that took care of you is not available. Once you are discharged, your primary care physician will handle any further medical issues. Please note that NO REFILLS for any discharge medications will be authorized once you are discharged, as it is imperative that you return to your primary care physician (or establish a relationship with a primary care physician if you do not have one) for your aftercare needs so that they can reassess your need for medications and monitor your lab values. If you do not have a primary care physician, you can call [phone] for a physician referral.   Increase activity slowly    Complete by:  As directed      ALLERGIES:  Allergies  Allergen Reactions  . Codeine Nausea Only and Other (See Comments)    Heavy amounts cause nausea  . Monopril [Fosinopril Sodium] Other (See Comments)    Muscle aches & pain  . Ampicillin Rash  . Percocet [Oxycodone-Acetaminophen] Nausea And Vomiting     Current Discharge Medication List    START taking these medications   Details    cefdinir (OMNICEF) 300 MG capsule Take 1 capsule (300 mg total) by mouth 2 (two) times daily. For 12 more days Qty: 24 capsule, Refills: 0    loperamide (IMODIUM) 2 MG capsule Take 1 capsule (2 mg total) by mouth 3 (three) times daily as needed for diarrhea or loose stools. Qty: 15 capsule, Refills: 0      CONTINUE these medications which have CHANGED   Details  metroNIDAZOLE (FLAGYL) 500 MG tablet Take 1 tablet (500 mg total) by mouth 3 (three) times daily. For 12 more days Qty: 36 tablet, Refills: 0      CONTINUE these medications which have NOT CHANGED   Details  acetaminophen (TYLENOL 8 HOUR ARTHRITIS PAIN) 650 MG CR tablet Take 1,300 mg by mouth 2 (two) times daily as needed for pain (pain).    atorvastatin (LIPITOR)  40 MG tablet TAKE 1 TABLET BY MOUTH EVERY DAY Qty: 90 tablet, Refills: 0    bisacodyl (DULCOLAX) 5 MG EC tablet Take 10 mg by mouth daily as needed for moderate constipation.    fenofibrate 160 MG tablet TAKE 1 TABLET (160 MG TOTAL) BY MOUTH DAILY. Qty: 90 tablet, Refills: 3    lisinopril (PRINIVIL,ZESTRIL) 10 MG tablet Take 10 mg by mouth daily. Refills: 5    metoprolol succinate (TOPROL-XL) 50 MG 24 hr tablet Take 2 tablets (100 mg total) by mouth daily. Qty: 180 tablet, Refills: 3    Multiple Vitamins-Minerals (MULTIVITAMINS THER. W/MINERALS) TABS Take 1 tablet by mouth daily.      nitroGLYCERIN (NITROSTAT) 0.4 MG SL tablet Place 0.4 mg under the tongue every 5 (five) minutes as needed for chest pain.    NU-IRON 150 MG capsule Take 150 mg by mouth daily. Refills: 5    omeprazole (PRILOSEC) 10 MG capsule TAKE ONE CAPSULE BY MOUTH EVERY DAY Qty: 90 capsule, Refills: 2    Tamsulosin HCl (FLOMAX) 0.4 MG CAPS Take 0.4 mg by mouth daily.      traMADol-acetaminophen (ULTRACET) 37.5-325 MG tablet Take 1 tablet by mouth every 6 (six) hours as needed for moderate pain. Qty: 10 tablet, Refills: 0    XARELTO 20 MG TABS tablet TAKE 1 TABLET BY MOUTH EVERY  DAY Qty: 30 tablet, Refills: 11      STOP taking these medications     ciprofloxacin (CIPRO) 500 MG tablet      Psyllium (METAMUCIL) 48.57 % POWD        Follow-up Information    KOIRALA,DIBAS, MD. Schedule an appointment as soon as possible for a visit in 1 week(s).   Specialty:  Family Medicine Contact information: [place] [address] [phone]        Winfield Cunas, MD. Schedule an appointment as soon as possible for a visit in 4 week(s).   Specialty:  Gastroenterology Contact information: [address] [place] [address] [phone]           TOTAL DISCHARGE TIME: 35 minutes  [facility] Pager [phone]  06/10/2016, 12:19 PM

## 2016-06-10 NOTE — Progress Notes (Signed)
Discussed discharge summary with patient. Reviewed all medications with patient. Patient received prescriptions. Patient did not have any questions. Patient ready for discharge.

## 2016-06-16 ENCOUNTER — Encounter: Payer: Self-pay | Admitting: Cardiovascular Disease

## 2016-06-18 ENCOUNTER — Ambulatory Visit: Payer: Medicare Other | Admitting: Cardiovascular Disease

## 2016-06-18 ENCOUNTER — Encounter: Payer: Self-pay | Admitting: Cardiovascular Disease

## 2016-06-28 ENCOUNTER — Other Ambulatory Visit: Payer: Self-pay | Admitting: Cardiovascular Disease

## 2016-07-13 ENCOUNTER — Other Ambulatory Visit: Payer: Self-pay | Admitting: Cardiovascular Disease

## 2016-07-29 ENCOUNTER — Ambulatory Visit: Payer: Medicare Other | Admitting: Cardiovascular Disease

## 2016-08-30 ENCOUNTER — Encounter: Payer: Self-pay | Admitting: Cardiovascular Disease

## 2016-08-30 ENCOUNTER — Ambulatory Visit (INDEPENDENT_AMBULATORY_CARE_PROVIDER_SITE_OTHER): Payer: Medicare Other | Admitting: Cardiovascular Disease

## 2016-08-30 VITALS — BP 122/68 | HR 59 | Ht 73.5 in | Wt 269.0 lb

## 2016-08-30 DIAGNOSIS — I2510 Atherosclerotic heart disease of native coronary artery without angina pectoris: Secondary | ICD-10-CM | POA: Diagnosis not present

## 2016-08-30 DIAGNOSIS — I779 Disorder of arteries and arterioles, unspecified: Secondary | ICD-10-CM | POA: Insufficient documentation

## 2016-08-30 DIAGNOSIS — I739 Peripheral vascular disease, unspecified: Secondary | ICD-10-CM

## 2016-08-30 DIAGNOSIS — I10 Essential (primary) hypertension: Secondary | ICD-10-CM

## 2016-08-30 DIAGNOSIS — I480 Paroxysmal atrial fibrillation: Secondary | ICD-10-CM

## 2016-08-30 HISTORY — DX: Disorder of arteries and arterioles, unspecified: I77.9

## 2016-08-30 LAB — COMPREHENSIVE METABOLIC PANEL
ALT: 28 U/L (ref 9–46)
AST: 25 U/L (ref 10–35)
Albumin: 4.2 g/dL (ref 3.6–5.1)
Alkaline Phosphatase: 56 U/L (ref 40–115)
BUN: 21 mg/dL (ref 7–25)
CO2: 26 mmol/L (ref 20–31)
Calcium: 9.3 mg/dL (ref 8.6–10.3)
Chloride: 106 mmol/L (ref 98–110)
Creat: 1.04 mg/dL (ref 0.70–1.25)
Glucose, Bld: 101 mg/dL — ABNORMAL HIGH (ref 65–99)
Potassium: 4.4 mmol/L (ref 3.5–5.3)
Sodium: 139 mmol/L (ref 135–146)
Total Bilirubin: 0.6 mg/dL (ref 0.2–1.2)
Total Protein: 6.8 g/dL (ref 6.1–8.1)

## 2016-08-30 LAB — LIPID PANEL
Cholesterol: 116 mg/dL (ref <200)
HDL: 31 mg/dL — ABNORMAL LOW (ref >40)
LDL Cholesterol: 64 mg/dL (ref <100)
Total CHOL/HDL Ratio: 3.7 Ratio (ref <5.0)
Triglycerides: 105 mg/dL (ref <150)
VLDL: 21 mg/dL (ref <30)

## 2016-08-30 NOTE — Progress Notes (Signed)
Cardiology Office Note   Date:  08/30/2016   ID:  Corey [name], DOB 05-13-1946, MRN [id number]  PCP:  [name], MD  Cardiologist:   Mertie Moores, MD   Chief Complaint  Patient presents with  . Coronary Artery Disease   Problem List: 1.  . Coronary artery disease-status post PTCA and stenting of his right coronary artery 2. Hypertension 3. Hyperlipidemia 4. 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 Atrial fibrillation - discovered after his stroke  History of Present Illness: Corey [name] is a 70 yo gentleman with a history of coronary artery disease. Status post PTCA and stenting of his right coronary artery. He also has a history of hypertension, and hyperlipidemia. 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  He had a stroke in Oct. He had some speech problems and loss of fine motor control of the right arm. TEE was negative for PFO or LAA thrombus. Event monitor showed no A-Fib. He has been on Plavix since his stents in  January 05, 2013:  Corey [name] had an episode of atrial fib in January. He was started on Xarelto and has done well.  He has had some fatigue on occasion. He monitors his BP.   He has recovered from his stroke.   Feb. 13, 2015:  Corey [name] is doing ok. Still having touble losing weight. Has had a URI for the past month or so. No CP, no palps. Has recovered well from his CVA. He is looking forward to being able to exercise more   December 23, 2014: Corey [name] is a 70 y.o. male who presents following recent hospitalization for rapid atrial fib. He presented to the hospital with syncope /presyncope.  He was found to have rapid atrial fibrillation.  Was started on IV diltiazem and converted by the next am. He has maintained NSR. Has gained some weight due to inactivity.    07/10/2015:  Corey [name] is doing well.   No CP , no dyspnea.  No HR irregularities.   Has had rapid atrial fib in March , 2016 . Is no Xarelto   January 05, 2016:  [name] is  seen back for CAD and HTN.and PAF Also has anemia and has been found to have low iron.  Has had a colonoscopy ~ 2 years ago.   Negative stool guiac.   Nov. 27, 2017: Corey [name] was admitted to the hospital in Sept. 2017 with diverticulitis. Doing well.   No CP or dyspnea.  Due for his carotid duplex - has mild Carotid artery disease.    Past Medical History:  Diagnosis Date  . Arthritis    "back, right knee, hands, ankles, neck" (05/31/2016)  . Atrial fibrillation with RVR ([place])   . Chronic lower back pain   . Coronary atherosclerosis of native coronary artery    a. BMS to [facility] 2004 and 2007, otherwise mild nonobstructive disease. EF normal.  . CVA (cerebral vascular accident) ([place]) 07/2012   AF discovered after stroke; denies residual on 12/04/2014  . Diverticulitis   . Dyslipidemia   . Essential hypertension, benign   . GERD (gastroesophageal reflux disease)   . Lumbar radiculopathy, chronic 02/04/2015   Right L5  . Obesity   . OSA on CPAP   . Paroxysmal atrial fibrillation ([place])    a. Discovered after stroke.  . Pneumonia 01/1996  . PONV (postoperative nausea and vomiting)   . Recurrent upper respiratory infection (URI)     Past  Surgical History:  Procedure Laterality Date  . ANTERIOR CERVICAL DECOMP/DISCECTOMY FUSION  07/2001; 10/2002   "C5-6; C6-7; redo"  . BACK SURGERY    . CARPAL TUNNEL RELEASE Left 10/2015  . COLONOSCOPY W/ POLYPECTOMY  02/2014  . CORONARY ANGIOPLASTY WITH STENT PLACEMENT  05/2003; 12/2005   "mid RCA; mid RCA"  . JOINT REPLACEMENT    . KNEE ARTHROSCOPY Left 10/2005  . KNEE ARTHROSCOPY W/ PARTIAL MEDIAL MENISCECTOMY Left 09/2005  . LUMBAR LAMINECTOMY/DECOMPRESSION MICRODISCECTOMY  03/2005   "L4-5"  . POSTERIOR LUMBAR FUSION  10/2003   L5-S1; "plates, screws"  . SHOULDER ARTHROSCOPY Right 08/2011   Debridement of labrum, arthroscopic distal clavicle excision  . SHOULDER OPEN ROTATOR CUFF REPAIR Left 07/2014  . TEE WITHOUT CARDIOVERSION  07/07/2012   Procedure:  TRANSESOPHAGEAL ECHOCARDIOGRAM (TEE);  Surgeon: Fay Records, MD;  Location: [facility];  Service: Cardiovascular;  Laterality: N/A;  . TONSILLECTOMY AND ADENOIDECTOMY  ~ 1956  . TOTAL KNEE ARTHROPLASTY Left 10/2006  . TRIGGER FINGER RELEASE Left 10/2015     Current Outpatient Prescriptions  Medication Sig Dispense Refill  . acetaminophen (TYLENOL 8 HOUR ARTHRITIS PAIN) 650 MG CR tablet Take 1,300 mg by mouth 2 (two) times daily as needed for pain (pain).    Marland Kitchen atorvastatin (LIPITOR) 40 MG tablet TAKE 1 TABLET BY MOUTH EVERY DAY 90 tablet 2  . bisacodyl (DULCOLAX) 5 MG EC tablet Take 10 mg by mouth daily as needed for moderate constipation.    . fenofibrate 160 MG tablet TAKE 1 TABLET (160 MG TOTAL) BY MOUTH DAILY. 90 tablet 1  . lisinopril (PRINIVIL,ZESTRIL) 10 MG tablet Take 10 mg by mouth daily.  5  . metoprolol succinate (TOPROL-XL) 50 MG 24 hr tablet Take 2 tablets (100 mg total) by mouth daily. 180 tablet 3  . Multiple Vitamins-Minerals (MULTIVITAMINS THER. W/MINERALS) TABS Take 1 tablet by mouth daily.      . nitroGLYCERIN (NITROSTAT) 0.4 MG SL tablet Place 0.4 mg under the tongue every 5 (five) minutes as needed for chest pain.    Marland Kitchen NU-IRON 150 MG capsule Take 150 mg by mouth daily.  5  . omeprazole (PRILOSEC) 10 MG capsule TAKE ONE CAPSULE BY MOUTH EVERY DAY (Patient taking differently: Take 10 mg by mouth every day) 90 capsule 2  . Tamsulosin HCl (FLOMAX) 0.4 MG CAPS Take 0.4 mg by mouth daily.      . traMADol-acetaminophen (ULTRACET) 37.5-325 MG tablet Take 1 tablet by mouth every 6 (six) hours as needed for moderate pain. 10 tablet 0  . XARELTO 20 MG TABS tablet TAKE 1 TABLET BY MOUTH EVERY DAY (Patient taking differently: Take 20 mg by mouth every day) 30 tablet 11   No current facility-administered medications for this visit.     Allergies:   Codeine; Monopril [fosinopril sodium]; Ampicillin; and Percocet [oxycodone-acetaminophen]    Social History:  The patient  reports  that he quit smoking about 13 years ago. His smoking use included Cigarettes. He has a 34.00 pack-year smoking history. He has never used smokeless tobacco. He reports that he does not drink alcohol or use drugs.   Family History:  The patient's family history includes Heart attack in his father; Heart disease in his father; Hypertension in his mother.    ROS:  Please see the history of present illness.    Review of Systems: Constitutional:  denies fever, chills, diaphoresis, appetite change and fatigue.  HEENT: denies photophobia, eye pain, redness, hearing loss, ear pain, congestion, sore throat,  rhinorrhea, sneezing, neck pain, neck stiffness and tinnitus.  Respiratory: denies SOB, DOE, cough, chest tightness, and wheezing.  Cardiovascular: denies chest pain, palpitations and leg swelling.  Gastrointestinal: denies nausea, vomiting, abdominal pain, diarrhea, constipation, blood in stool.  Genitourinary: denies dysuria, urgency, frequency, hematuria, flank pain and difficulty urinating.  Musculoskeletal: denies  myalgias, back pain, joint swelling, arthralgias and gait problem.   Skin: denies pallor, rash and wound.  Neurological: denies dizziness, seizures, syncope, weakness, light-headedness, numbness and headaches.   Hematological: denies adenopathy, easy bruising, personal or family bleeding history.  Psychiatric/ Behavioral: denies suicidal ideation, mood changes, confusion, nervousness, sleep disturbance and agitation.       All other systems are reviewed and negative.    PHYSICAL EXAM: VS:  BP 122/68   Pulse (!) 59   Ht 6' 1.5" (1.867 m)   Wt 269 lb (122 kg)   BMI 35.01 kg/m  , BMI Body mass index is 35.01 kg/m. GEN: Well nourished, well developed, in no acute distress  HEENT: normal  Neck: no JVD, carotid bruits, or masses Cardiac: RRR; no murmurs, rubs, or gallops,no edema  Respiratory:  clear to auscultation bilaterally, normal work of breathing GI: soft,  nontender, nondistended, + BS MS: no deformity or atrophy  Skin: warm and dry, no rash Neuro:  Strength and sensation are intact Psych: normal   EKG:  EKG is ordered today. The ekg ordered today demonstrates sinus brady at 59. No ST or T wave change   Recent Labs: 06/01/2016: Magnesium 2.0; TSH 1.162 06/04/2016: ALT 22 06/07/2016: Hemoglobin 12.6; Platelets 268 06/09/2016: BUN 8; Creatinine, Ser 0.82; Potassium 4.1; Sodium 137    Lipid Panel    Component Value Date/Time   CHOL 112 (L) 01/05/2016 0838   CHOL 108 01/05/2013 0748   TRIG 91 01/05/2016 0838   TRIG 84 01/05/2013 0748   HDL 30 (L) 01/05/2016 0838   HDL 29 (L) 01/05/2013 0748   CHOLHDL 3.7 01/05/2016 0838   VLDL 18 01/05/2016 0838   LDLCALC 64 01/05/2016 0838   LDLCALC 62 01/05/2013 0748      Wt Readings from Last 3 Encounters:  08/30/16 269 lb (122 kg)  06/04/16 263 lb 12.8 oz (119.7 kg)  05/31/16 261 lb 0.4 oz (118.4 kg)      Other studies Reviewed: Additional studies/ records that were reviewed today include: . Review of the above records demonstrates:    ASSESSMENT AND PLAN:  1.  Coronary artery disease-status post PTCA and stenting of his right coronary artery - no angina, Encouraged him to exercise . Check complete medical profile and lipids today.  2. Hypertension - BP is ok  3. Carotid artery disease - has mild bilateral  carotid disease by previous  duplex scans Will recheck a carotid duplex .   4. Hyperlipidemia - will check labs today .    5. 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 Atrial fibrillation - discovered after his stroke-  CHADS2VASC is   28 ( age, CAD, CVA, HTN, ) , Continue current dose of Xarelto  7. Fatigue:   May be due to his high dose of Toprol XL.  Will reduce dose  to 100 mg a day  He is currently on Xarelto.  Continue meds.   8. Iron deficiency:   Has hx of iron deficiency.   This may be causing some of his fatigue. We'll check regular labs today and also check a CBC  and an iron panel.  Current medicines  are reviewed at length with the patient today.  The patient does not have concerns regarding medicines.  The following changes have been made:  no change  Labs/ tests ordered today include:  No orders of the defined types were placed in this encounter.   Disposition:   FU with me in 6 months.     Mertie Moores, MD  08/30/2016 8:17 AM    [address], [place], [place]  [address] Phone: [phone]; Fax: [phone]

## 2016-08-30 NOTE — Patient Instructions (Signed)
Medication Instructions:  Your physician recommends that you continue on your current medications as directed. Please refer to the Current Medication list given to you today.   Labwork: TODAY - cholesterol, complete metabolic panel   Testing/Procedures: Your physician has requested that you have a carotid duplex. This test is an ultrasound of the carotid arteries in your neck. It looks at blood flow through these arteries that supply the brain with blood. Allow one hour for this exam. There are no restrictions or special instructions.    Follow-Up: Your physician wants you to follow-up in: 6 months with Dr. Acie Fredrickson.  You will receive a reminder letter in the mail two months in advance. If you don't receive a letter, please call our office to schedule the follow-up appointment.   If you need a refill on your cardiac medications before your next appointment, please call your pharmacy.   Thank you for choosing [facility]! Christen Bame, RN [phone]

## 2016-09-01 ENCOUNTER — Ambulatory Visit (HOSPITAL_COMMUNITY)
Admission: RE | Admit: 2016-09-01 | Discharge: 2016-09-01 | Disposition: A | Discharge status: Home / Self Care | Payer: Medicare Other | Source: Ambulatory Visit | Attending: Cardiovascular Disease | Admitting: Cardiovascular Disease

## 2016-09-01 DIAGNOSIS — I6523 Occlusion and stenosis of bilateral carotid arteries: Secondary | ICD-10-CM | POA: Diagnosis not present

## 2016-09-01 DIAGNOSIS — Z8673 Personal history of transient ischemic attack (TIA), and cerebral infarction without residual deficits: Secondary | ICD-10-CM | POA: Insufficient documentation

## 2016-09-01 DIAGNOSIS — Z87891 Personal history of nicotine dependence: Secondary | ICD-10-CM | POA: Diagnosis not present

## 2016-09-01 DIAGNOSIS — I2510 Atherosclerotic heart disease of native coronary artery without angina pectoris: Secondary | ICD-10-CM | POA: Insufficient documentation

## 2016-09-01 DIAGNOSIS — I10 Essential (primary) hypertension: Secondary | ICD-10-CM | POA: Insufficient documentation

## 2016-09-01 DIAGNOSIS — E785 Hyperlipidemia, unspecified: Secondary | ICD-10-CM | POA: Diagnosis not present

## 2016-09-01 DIAGNOSIS — I779 Disorder of arteries and arterioles, unspecified: Secondary | ICD-10-CM | POA: Insufficient documentation

## 2016-09-01 DIAGNOSIS — I739 Peripheral vascular disease, unspecified: Secondary | ICD-10-CM

## 2016-10-15 ENCOUNTER — Encounter: Payer: Self-pay | Admitting: Adult Health

## 2016-10-15 ENCOUNTER — Ambulatory Visit (INDEPENDENT_AMBULATORY_CARE_PROVIDER_SITE_OTHER): Payer: Medicare Other | Admitting: Adult Health

## 2016-10-15 VITALS — BP 122/60 | HR 62 | Ht 74.0 in | Wt 275.0 lb

## 2016-10-15 DIAGNOSIS — G4733 Obstructive sleep apnea (adult) (pediatric): Secondary | ICD-10-CM

## 2016-10-15 NOTE — Progress Notes (Signed)
$'@Patient's$  ID: Corey [name], male    DOB: 05-20-1946, 71 y.o.   MRN: [id number]  No chief complaint on file.   Referring provider: [name], MD  HPI: 71 yo male with severe OSA on CPAP  Former on Dr. Gwenette Greet patient   TEST :  NPSG 2006: AHI 42/hr   10/15/2016 Follow up : OSA  Pt returns for a annual follow up for severe OSA on CPAP At bedtime   Says he is doing very well on CPAP , feels rested with no significant daytime sleepiness.  He got new memory foam CPAP mask year and really likes it but feels it is not the right size as it slides at night .  Download shows excellent compliance and control w/ avg usage at 7.75hr, AHI 3.2. Min leaks on auto set 5-15cmH2O.   Has upcoming Right hand surgery w/ thumb joint replacement. In 2 weeks. Says he will have local anesthesia. We discussed CPAP usage if stay overnight and to have for post op if needed.     Allergies  Allergen Reactions  . Codeine Nausea Only and Other (See Comments)    Heavy amounts cause nausea  . Monopril [Fosinopril Sodium] Other (See Comments)    Muscle aches & pain  . Ampicillin Rash  . Percocet [Oxycodone-Acetaminophen] Nausea And Vomiting    Immunization History  Administered Date(s) Administered  . Influenza Split 08/04/2013  . Influenza-Unspecified 08/04/2014, 07/05/2015, 06/15/2016  . Pneumococcal Conjugate-13 12/02/2013, 07/04/2014    Past Medical History:  Diagnosis Date  . Arthritis    "back, right knee, hands, ankles, neck" (05/31/2016)  . Atrial fibrillation with RVR ([place])   . Chronic lower back pain   . Coronary atherosclerosis of native coronary artery    a. BMS to [facility] 2004 and 2007, otherwise mild nonobstructive disease. EF normal.  . CVA (cerebral vascular accident) ([place]) 07/2012   AF discovered after stroke; denies residual on 12/04/2014  . Diverticulitis   . Dyslipidemia   . Essential hypertension, benign   . GERD (gastroesophageal reflux disease)   . Lumbar radiculopathy,  chronic 02/04/2015   Right L5  . Obesity   . OSA on CPAP   . Paroxysmal atrial fibrillation ([place])    a. Discovered after stroke.  . Pneumonia 01/1996  . PONV (postoperative nausea and vomiting)   . Recurrent upper respiratory infection (URI)     Tobacco History: History  Smoking Status  . Former Smoker  . Packs/day: 1.00  . Years: 34.00  . Types: Cigarettes  . Quit date: 05/05/2003  Smokeless Tobacco  . Never Used   Counseling given: Not Answered   Outpatient Encounter Prescriptions as of 10/15/2016  Medication Sig  . acetaminophen (TYLENOL 8 HOUR ARTHRITIS PAIN) 650 MG CR tablet Take 1,300 mg by mouth 2 (two) times daily as needed for pain (pain).  Marland Kitchen atorvastatin (LIPITOR) 40 MG tablet TAKE 1 TABLET BY MOUTH EVERY DAY  . bisacodyl (DULCOLAX) 5 MG EC tablet Take 10 mg by mouth daily as needed for moderate constipation.  . fenofibrate 160 MG tablet TAKE 1 TABLET (160 MG TOTAL) BY MOUTH DAILY.  Marland Kitchen lisinopril (PRINIVIL,ZESTRIL) 10 MG tablet Take 10 mg by mouth daily.  . metoprolol succinate (TOPROL-XL) 50 MG 24 hr tablet Take 2 tablets (100 mg total) by mouth daily.  . Multiple Vitamins-Minerals (MULTIVITAMINS THER. W/MINERALS) TABS Take 1 tablet by mouth daily.    . nitroGLYCERIN (NITROSTAT) 0.4 MG SL tablet Place 0.4 mg under the tongue every 5 (  five) minutes as needed for chest pain.  Marland Kitchen omeprazole (PRILOSEC) 10 MG capsule TAKE ONE CAPSULE BY MOUTH EVERY DAY  . Tamsulosin HCl (FLOMAX) 0.4 MG CAPS Take 0.4 mg by mouth daily.    . traMADol-acetaminophen (ULTRACET) 37.5-325 MG tablet Take 1 tablet by mouth every 6 (six) hours as needed for moderate pain.  Marland Kitchen XARELTO 20 MG TABS tablet TAKE 1 TABLET BY MOUTH EVERY DAY  . [DISCONTINUED] NU-IRON 150 MG capsule Take 150 mg by mouth daily.   No facility-administered encounter medications on file as of 10/15/2016.      Review of Systems  Constitutional:   No  weight loss, night sweats,  Fevers, chills, fatigue, or  lassitude.  HEENT:    No headaches,  Difficulty swallowing,  Tooth/dental problems, or  Sore throat,                No sneezing, itching, ear ache, nasal congestion, post nasal drip,   CV:  No chest pain,  Orthopnea, PND, swelling in lower extremities, anasarca, dizziness, palpitations, syncope.   GI  No heartburn, indigestion, abdominal pain, nausea, vomiting, diarrhea, change in bowel habits, loss of appetite, bloody stools.   Resp: No shortness of breath with exertion or at rest.  No excess mucus, no productive cough,  No non-productive cough,  No coughing up of blood.  No change in color of mucus.  No wheezing.  No chest wall deformity  Skin: no rash or lesions.  GU: no dysuria, change in color of urine, no urgency or frequency.  No flank pain, no hematuria   MS:  +hand pain.    Physical Exam  BP 122/60   Pulse 62   Ht '6\' 2"'$  (1.88 m)   Wt 275 lb (124.7 kg)   SpO2 96%   BMI 35.31 kg/m   GEN: A/Ox3; pleasant , NAD, obese    HEENT:  [place]/AT,  EACs-clear, TMs-wnl, NOSE-clear, THROAT-clear, no lesions, no postnasal drip or exudate noted.   NECK:  Supple w/ fair ROM; no JVD; normal carotid impulses w/o bruits; no thyromegaly or nodules palpated; no lymphadenopathy.  Class 2-3 MP airway   RESP  Clear  P & A; w/o, wheezes/ rales/ or rhonchi. no accessory muscle use, no dullness to percussion  CARD:  RRR, no m/r/g, no peripheral edema, pulses intact, no cyanosis or clubbing.  GI:   Soft & nt; nml bowel sounds; no organomegaly or masses detected.   Musco: Warm bil, no deformities or joint swelling noted.   Neuro: alert, no focal deficits noted.    Skin: Warm, no lesions or rashes  Psych:  No change in mood or affect. No depression or anxiety.  No memory loss.  Lab Results:  CBC    Component Value Date/Time   WBC 9.8 06/07/2016 0638   RBC 4.18 (L) 06/07/2016 0638   HGB 12.6 (L) 06/07/2016 0638   HCT 38.8 (L) 06/07/2016 0638   PLT 268 06/07/2016 0638   MCV 92.8 06/07/2016 0638   MCH 30.1  06/07/2016 0638   MCHC 32.5 06/07/2016 0638   RDW 14.4 06/07/2016 0638   LYMPHSABS 1.0 06/05/2016 0353   MONOABS 1.9 (H) 06/05/2016 0353   EOSABS 0.1 06/05/2016 0353   BASOSABS 0.0 06/05/2016 0353    BMET    Component Value Date/Time   NA 139 08/30/2016 0840   K 4.4 08/30/2016 0840   CL 106 08/30/2016 0840   CO2 26 08/30/2016 0840   GLUCOSE 101 (H) 08/30/2016 0840  BUN 21 08/30/2016 0840   CREATININE 1.04 08/30/2016 0840   CALCIUM 9.3 08/30/2016 0840   GFRNONAA >60 06/09/2016 1254   GFRAA >60 06/09/2016 1254    BNP No results found for: BNP  ProBNP No results found for: PROBNP  Imaging: No results found.   Assessment & Plan:   OSA (obstructive sleep apnea) Well controlled on CPAP  Mask eval at [facility] order sent to DME   Plan  Patient Instructions  Continue with CPAP At bedtime   Discuss with [facility]PS mask size issues.  Keep up good work  Do not drive if sleepy.  Work on weight loss Follow up Dr. Halford Chessman  In 1 year and As needed          Rexene Edison, NP 10/15/2016

## 2016-10-15 NOTE — Progress Notes (Signed)
I have reviewed and agree with assessment/plan.  Chesley Mires, MD [facility] Pulmonary/Critical Care 10/15/2016, 11:58 AM Pager:  [phone]

## 2016-10-15 NOTE — Patient Instructions (Addendum)
Continue with CPAP At bedtime   Discuss with APS mask size issues.  Keep up good work  Do not drive if sleepy.  Work on weight loss Follow up Dr. Halford Chessman  In 1 year and As needed

## 2016-10-15 NOTE — Assessment & Plan Note (Signed)
Well controlled on CPAP  Mask eval at [facility] order sent to DME   Plan  Patient Instructions  Continue with CPAP At bedtime   Discuss with [facility] mask size issues.  Keep up good work  Do not drive if sleepy.  Work on weight loss Follow up Dr. Halford Chessman  In 1 year and As needed

## 2016-10-25 ENCOUNTER — Encounter: Payer: Self-pay | Admitting: Adult Health

## 2016-11-29 ENCOUNTER — Other Ambulatory Visit: Payer: Self-pay | Admitting: Cardiovascular Disease

## 2016-12-01 ENCOUNTER — Other Ambulatory Visit: Payer: Self-pay | Admitting: Cardiovascular Disease

## 2016-12-01 MED ORDER — METOPROLOL SUCCINATE ER 50 MG PO TB24: 100.0000 mg | ORAL_TABLET | Freq: Every day | ORAL | 2 refills | Status: DC

## 2017-01-10 ENCOUNTER — Other Ambulatory Visit: Payer: Self-pay | Admitting: Cardiovascular Disease

## 2017-02-01 ENCOUNTER — Other Ambulatory Visit: Payer: Self-pay | Admitting: Cardiovascular Disease

## 2017-02-03 ENCOUNTER — Other Ambulatory Visit: Payer: Self-pay | Admitting: *Deleted

## 2017-02-03 ENCOUNTER — Other Ambulatory Visit: Payer: Self-pay | Admitting: Cardiovascular Disease

## 2017-02-21 ENCOUNTER — Other Ambulatory Visit: Payer: Self-pay | Admitting: Cardiovascular Disease

## 2017-02-21 NOTE — Telephone Encounter (Signed)
Request received for Xarelto '20mg'$ ; pt is 71 yrs old, Wt-124.7kg on 10/15/16, Crea-1.04 on 08/30/16, last seen by Dr. Acie Fredrickson on 08/30/16, CreCl-118.31m/min. Will send in refill request to requested pharmacy.

## 2017-03-14 ENCOUNTER — Ambulatory Visit (INDEPENDENT_AMBULATORY_CARE_PROVIDER_SITE_OTHER): Payer: Medicare Other | Admitting: Physician Assistant

## 2017-03-14 ENCOUNTER — Encounter: Payer: Self-pay | Admitting: Physician Assistant

## 2017-03-14 VITALS — BP 130/60 | HR 57 | Ht 73.5 in | Wt 276.0 lb

## 2017-03-14 DIAGNOSIS — I10 Essential (primary) hypertension: Secondary | ICD-10-CM | POA: Diagnosis not present

## 2017-03-14 DIAGNOSIS — E785 Hyperlipidemia, unspecified: Secondary | ICD-10-CM

## 2017-03-14 DIAGNOSIS — I2510 Atherosclerotic heart disease of native coronary artery without angina pectoris: Secondary | ICD-10-CM | POA: Diagnosis not present

## 2017-03-14 DIAGNOSIS — I739 Peripheral vascular disease, unspecified: Secondary | ICD-10-CM

## 2017-03-14 DIAGNOSIS — I779 Disorder of arteries and arterioles, unspecified: Secondary | ICD-10-CM | POA: Diagnosis not present

## 2017-03-14 DIAGNOSIS — I480 Paroxysmal atrial fibrillation: Secondary | ICD-10-CM

## 2017-03-14 NOTE — Progress Notes (Signed)
Cardiology Office Note:    Date:  03/14/2017   ID:  Corey [name], DOB Nov 03, 1945, MRN [id number]  PCP:  Corey Amel, MD  Cardiologist:  Dr. Liam [name]    Referring MD: Corey Amel, MD   Chief Complaint  Patient presents with  . Follow-up    CAD, AFib    History of Present Illness:    Corey [name] is a 71 y.o. male with a hx of CAD s/p PCI with Taxus DES to RCA in 2004 and inferior STEMI 2/2 stent thrombosis in 12/2005 tx with BMS to the RCA, carotid artery disease, PAF s/p DCCV for AF with RVR in 2016, prior CVA, HTN, HL, OSA.  He is on Rivaroxaban for anticoagulation.  Last seen in 08/2016.    [name] returns for Cardiology follow up.  He is here alone.  He is doing well without chest pain, shortness of breath, syncope.  He has not had any palpitations suggestive of atrial fibrillation.  He has not had any melena, hematochezia, hematuria.  He just had hand surgery and has not been able to work out for several months.  He is planning to return to the gym today.  He feels sluggish at times and thinks this is related to his weight.   Prior CV studies:   The following studies were reviewed today:  Carotid US 08/2016 R 1-39; L 40-59 >> repeat 1 year  Echo 12/2014 Mod LVH, EF 60  Myoview 04/2014 Low Risk; inferoapical defect likely due to diaphragmatic attenuation, EF 66  Event monitor 07/2012 NSR, occ PVCs  Cardiac Catheterization 12/2005 EF 13 Left main mid 10-20 LAD mid 30-40 LCx okay RCA proximal 20, mid stent 50-60 with in-stent thrombosis PCI: 3.5 x 24 mm Liberte BMS to the RCA  Past Medical History:  Diagnosis Date  . Arthritis    "back, right knee, hands, ankles, neck" (05/31/2016)  . Atrial fibrillation with RVR ([place])   . Chronic lower back pain   . Coronary atherosclerosis of native coronary artery    a. BMS to [facility] 2004 and 2007, otherwise mild nonobstructive disease. EF normal.  . CVA (cerebral vascular accident) ([place]) 07/2012   AF discovered after  stroke; denies residual on 12/04/2014  . Diverticulitis   . Dyslipidemia   . Essential hypertension, benign   . GERD (gastroesophageal reflux disease)   . Lumbar radiculopathy, chronic 02/04/2015   Right L5  . Obesity   . OSA on CPAP   . Paroxysmal atrial fibrillation ([place])    a. Discovered after stroke.  . Pneumonia 01/1996  . PONV (postoperative nausea and vomiting)   . Recurrent upper respiratory infection (URI)     Past Surgical History:  Procedure Laterality Date  . ANTERIOR CERVICAL DECOMP/DISCECTOMY FUSION  07/2001; 10/2002   "C5-6; C6-7; redo"  . BACK SURGERY    . CARPAL TUNNEL RELEASE Left 10/2015  . COLONOSCOPY W/ POLYPECTOMY  02/2014  . CORONARY ANGIOPLASTY WITH STENT PLACEMENT  05/2003; 12/2005   "mid RCA; mid RCA"  . JOINT REPLACEMENT    . KNEE ARTHROSCOPY Left 10/2005  . KNEE ARTHROSCOPY W/ PARTIAL MEDIAL MENISCECTOMY Left 09/2005  . LUMBAR LAMINECTOMY/DECOMPRESSION MICRODISCECTOMY  03/2005   "L4-5"  . POSTERIOR LUMBAR FUSION  10/2003   L5-S1; "plates, screws"  . SHOULDER ARTHROSCOPY Right 08/2011   Debridement of labrum, arthroscopic distal clavicle excision  . SHOULDER OPEN ROTATOR CUFF REPAIR Left 07/2014  . TEE WITHOUT CARDIOVERSION  07/07/2012   Procedure: TRANSESOPHAGEAL ECHOCARDIOGRAM (TEE);  Surgeon: Fay Records, MD;  Location: [facility];  Service: Cardiovascular;  Laterality: N/A;  . TONSILLECTOMY AND ADENOIDECTOMY  ~ 1956  . TOTAL KNEE ARTHROPLASTY Left 10/2006  . TRIGGER FINGER RELEASE Left 10/2015    Current Medications: Current Meds  Medication Sig  . acetaminophen (TYLENOL 8 HOUR ARTHRITIS PAIN) 650 MG CR tablet Take 1,300 mg by mouth 2 (two) times daily as needed for pain (pain).  Marland Kitchen atorvastatin (LIPITOR) 40 MG tablet TAKE 1 TABLET BY MOUTH EVERY DAY  . bisacodyl (DULCOLAX) 5 MG EC tablet Take 10 mg by mouth daily as needed for moderate constipation.  . fenofibrate 160 MG tablet TAKE 1 TABLET (160 MG TOTAL) BY MOUTH DAILY.  . finasteride (PROSCAR) 5  MG tablet Take 5 mg by mouth daily.  Marland Kitchen lisinopril (PRINIVIL,ZESTRIL) 10 MG tablet Take 10 mg by mouth daily.  . metoprolol succinate (TOPROL-XL) 50 MG 24 hr tablet Take 2 tablets (100 mg total) by mouth daily.  . Multiple Vitamins-Minerals (MULTIVITAMINS THER. W/MINERALS) TABS Take 1 tablet by mouth daily.    . nitroGLYCERIN (NITROSTAT) 0.4 MG SL tablet Place 1 tablet (0.4 mg total) under the tongue every 5 (five) minutes as needed for chest pain.  Marland Kitchen omeprazole (PRILOSEC) 10 MG capsule TAKE ONE CAPSULE BY MOUTH EVERY DAY  . Tamsulosin HCl (FLOMAX) 0.4 MG CAPS Take 0.4 mg by mouth daily.    . traMADol-acetaminophen (ULTRACET) 37.5-325 MG tablet Take 1 tablet by mouth every 6 (six) hours as needed for moderate pain.  Marland Kitchen XARELTO 20 MG TABS tablet TAKE 1 TABLET BY MOUTH EVERY DAY     Allergies:   Codeine; Monopril [fosinopril sodium]; Ampicillin; and Percocet [oxycodone-acetaminophen]   Social History   Social History  . Marital status: Married    Spouse name: [name]  . Number of children: 3  . Years of education: 14   Occupational History  . Retired Disabled     Works at [employer] part time   Social History Main Topics  . Smoking status: Former Smoker    Packs/day: 1.00    Years: 34.00    Types: Cigarettes    Quit date: 05/05/2003  . Smokeless tobacco: Never Used  . Alcohol use No  . Drug use: No  . Sexual activity: Yes   Other Topics Concern  . None   Social History Narrative   Patient lives at home with spouse.   Caffeine Use:      Family Hx: The patient's family history includes Heart attack in his father; Heart disease in his father; Hypertension in his mother.  ROS:   Please see the history of present illness.    Review of Systems  Respiratory: Positive for shortness of breath.    All other systems reviewed and are negative.   EKGs/Labs/Other Test Reviewed:    EKG:  EKG is   ordered today.  The ekg ordered today demonstrates sinus bradycardia, HR 57, inferior Q  waves, QTc 428 ms, no change from prior tracing  Recent Labs: 06/01/2016: Magnesium 2.0; TSH 1.162 06/07/2016: Hemoglobin 12.6; Platelets 268 08/30/2016: ALT 28; BUN 21; Creat 1.04; Potassium 4.4; Sodium 139   Recent Lipid Panel Lab Results  Component Value Date/Time   CHOL 116 08/30/2016 08:40 AM   CHOL 108 01/05/2013 07:48 AM   TRIG 105 08/30/2016 08:40 AM   TRIG 84 01/05/2013 07:48 AM   HDL 31 (L) 08/30/2016 08:40 AM   HDL 29 (L) 01/05/2013 07:48 AM   CHOLHDL 3.7 08/30/2016 08:40 AM  [place] 64 08/30/2016 08:40 AM   LDLCALC 62 01/05/2013 07:48 AM    Physical Exam:    VS:  BP 130/60   Pulse (!) 57   Ht 6' 1.5" (1.867 m)   Wt 276 lb (125.2 kg)   BMI 35.92 kg/m     Wt Readings from Last 3 Encounters:  03/14/17 276 lb (125.2 kg)  10/15/16 275 lb (124.7 kg)  08/30/16 269 lb (122 kg)     Physical Exam  Constitutional: He is oriented to person, place, and time. He appears well-developed and well-nourished. No distress.  HENT:  Head: Normocephalic and atraumatic.  Eyes: No scleral icterus.  Neck: Normal range of motion. No JVD present.  Cardiovascular: Normal rate, regular rhythm, S1 normal and S2 normal.   No murmur heard. Pulmonary/Chest: Effort normal and breath sounds normal. He has no wheezes. He has no rhonchi. He has no rales.  Abdominal: Soft. There is no tenderness.  Musculoskeletal: He exhibits edema (trace bilat LE edema).  Neurological: He is alert and oriented to person, place, and time.  Skin: Skin is warm and dry.  Psychiatric: He has a normal mood and affect.    ASSESSMENT:    1. Coronary artery disease involving native coronary artery of native heart without angina pectoris   2. PAF (paroxysmal atrial fibrillation) ([place])   3. Bilateral carotid artery disease ([place])   4. Essential hypertension   5. Dyslipidemia    PLAN:    In order of problems listed above:  1. Coronary artery disease involving native coronary artery of native heart without  angina pectoris -  S/p Taxus DES to the RCA in 2004 and inferior STEMI 2/2 stent thrombosis in 2007 tx with a BMS.  Myoview in 2015 was low risk.  He is doing well without angina.  He is not on ASA as he is on Rivaroxaban.  Continue statin, beta-blocker.  2. PAF (paroxysmal atrial fibrillation) ([facility]) -  Maintaining NSR.  He has symptoms with atrial fibrillation and has not had any suggestion of recurrent atrial fibrillation in over a year.  Check CBC, BMET today.  Continue Rivaroxaban 20 mg QD (Creatinine Clearance 119).    3. Bilateral carotid artery disease ([place]) -  FU ultrasound due in 08/2017.    -  Plan: VAS US CAROTID 08/2017  4. Essential hypertension -  The patient's blood pressure is controlled on his current regimen.  Continue current therapy.  Check BMET today.   5. Dyslipidemia - LDL optimal on most recent lab work.  Continue current Rx.    Dispo:  Return in about 6 months (around 09/13/2017) for Routine Follow Up, w/ Dr. Acie Fredrickson.   Medication Adjustments/Labs and Tests Ordered: Current medicines are reviewed at length with the patient today.  Concerns regarding medicines are outlined above.  Orders/Tests:  Orders Placed This Encounter  Procedures  . Basic Metabolic Panel (BMET)  . CBC  . EKG 12-Lead   Medication changes: No orders of the defined types were placed in this encounter.  Signed, Richardson Dopp, PA-C  03/14/2017 2:25 PM    [facility] [place], [place], [place]  [address] Phone: [phone]; Fax: [phone]

## 2017-03-14 NOTE — Patient Instructions (Signed)
Medication Instructions:  1. Your physician recommends that you continue on your current medications as directed. Please refer to the Current Medication list given to you today.   Labwork: TODAY BMET, CBC  Testing/Procedures: 1. Your physician has requested that you have a carotid duplex. This test is an ultrasound of the carotid arteries in your neck. It looks at blood flow through these arteries that supply the brain with blood. Allow one hour for this exam. There are no restrictions or special instructions. THIS IS TO BE DONE IN 08/2017    Follow-Up: Your physician wants you to follow-up in: [place] DR. Acie Fredrickson You will receive a reminder letter in the mail two months in advance. If you don't receive a letter, please call our office to schedule the follow-up appointment.   Any Other Special Instructions Will Be Listed Below (If Applicable).     If you need a refill on your cardiac medications before your next appointment, please call your pharmacy.

## 2017-03-15 ENCOUNTER — Telehealth: Payer: Self-pay | Admitting: *Deleted

## 2017-03-15 LAB — CBC
Hematocrit: 40.8 % (ref 37.5–51.0)
Hemoglobin: 13.1 g/dL (ref 13.0–17.7)
MCH: 30 pg (ref 26.6–33.0)
MCHC: 32.1 g/dL (ref 31.5–35.7)
MCV: 94 fL (ref 79–97)
Platelets: 233 10*3/uL (ref 150–379)
RBC: 4.36 x10E6/uL (ref 4.14–5.80)
RDW: 14.4 % (ref 12.3–15.4)
WBC: 6 10*3/uL (ref 3.4–10.8)

## 2017-03-15 LAB — BASIC METABOLIC PANEL
BUN/Creatinine Ratio: 19 (ref 10–24)
BUN: 20 mg/dL (ref 8–27)
CO2: 21 mmol/L (ref 20–29)
Calcium: 9.4 mg/dL (ref 8.6–10.2)
Chloride: 105 mmol/L (ref 96–106)
Creatinine, Ser: 1.08 mg/dL (ref 0.76–1.27)
GFR calc Af Amer: 81 mL/min/{1.73_m2} (ref >59)
GFR calc non Af Amer: 70 mL/min/{1.73_m2} (ref >59)
Glucose: 102 mg/dL — ABNORMAL HIGH (ref 65–99)
Potassium: 4.3 mmol/L (ref 3.5–5.2)
Sodium: 139 mmol/L (ref 134–144)

## 2017-03-15 NOTE — Telephone Encounter (Signed)
Pt has been notified of lab results by phone with verbal understanding. Pt thanked me for my call today.   

## 2017-03-15 NOTE — Telephone Encounter (Signed)
-----   Message from Liliane Shi, [place] sent at 03/15/2017  7:52 AM EDT ----- Please call the patient Kidney function is normal. The hemoglobin is normal. Continue with current treatment plan. Richardson Dopp, PA-C   03/15/2017 7:51 AM

## 2017-04-11 ENCOUNTER — Other Ambulatory Visit: Payer: Self-pay | Admitting: Cardiovascular Disease

## 2017-05-05 ENCOUNTER — Other Ambulatory Visit: Payer: Self-pay | Admitting: Family Medicine

## 2017-05-05 DIAGNOSIS — I671 Cerebral aneurysm, nonruptured: Secondary | ICD-10-CM

## 2017-05-12 ENCOUNTER — Ambulatory Visit
Admission: RE | Admit: 2017-05-12 | Discharge: 2017-05-12 | Disposition: A | Discharge status: Home / Self Care | Payer: Medicare Other | Source: Ambulatory Visit | Attending: Family Medicine | Admitting: Family Medicine

## 2017-05-12 DIAGNOSIS — I671 Cerebral aneurysm, nonruptured: Secondary | ICD-10-CM

## 2017-05-12 MED ORDER — IOPAMIDOL (ISOVUE-370) INJECTION 76%
75.0000 mL | Freq: Once | INTRAVENOUS | Status: AC | PRN
  Administered 2017-05-12: 75 mL via INTRAVENOUS

## 2017-05-26 ENCOUNTER — Observation Stay (HOSPITAL_COMMUNITY)
Admission: EM | Admit: 2017-05-26 | Discharge: 2017-05-27 | Disposition: A | Discharge status: Home / Self Care | Payer: Medicare Other | Attending: Cardiovascular Disease | Admitting: Cardiovascular Disease

## 2017-05-26 DIAGNOSIS — Z79899 Other long term (current) drug therapy: Secondary | ICD-10-CM | POA: Insufficient documentation

## 2017-05-26 DIAGNOSIS — Z880 Allergy status to penicillin: Secondary | ICD-10-CM | POA: Insufficient documentation

## 2017-05-26 DIAGNOSIS — Z955 Presence of coronary angioplasty implant and graft: Secondary | ICD-10-CM | POA: Diagnosis not present

## 2017-05-26 DIAGNOSIS — G4733 Obstructive sleep apnea (adult) (pediatric): Secondary | ICD-10-CM | POA: Insufficient documentation

## 2017-05-26 DIAGNOSIS — E785 Hyperlipidemia, unspecified: Secondary | ICD-10-CM | POA: Diagnosis not present

## 2017-05-26 DIAGNOSIS — I700 Atherosclerosis of aorta: Secondary | ICD-10-CM | POA: Diagnosis not present

## 2017-05-26 DIAGNOSIS — Z87891 Personal history of nicotine dependence: Secondary | ICD-10-CM | POA: Insufficient documentation

## 2017-05-26 DIAGNOSIS — Z86010 Personal history of colonic polyps: Secondary | ICD-10-CM | POA: Insufficient documentation

## 2017-05-26 DIAGNOSIS — Z96652 Presence of left artificial knee joint: Secondary | ICD-10-CM | POA: Insufficient documentation

## 2017-05-26 DIAGNOSIS — I25119 Atherosclerotic heart disease of native coronary artery with unspecified angina pectoris: Secondary | ICD-10-CM | POA: Diagnosis not present

## 2017-05-26 DIAGNOSIS — M5416 Radiculopathy, lumbar region: Secondary | ICD-10-CM | POA: Insufficient documentation

## 2017-05-26 DIAGNOSIS — I471 Supraventricular tachycardia, unspecified: Secondary | ICD-10-CM | POA: Diagnosis present

## 2017-05-26 DIAGNOSIS — Z8673 Personal history of transient ischemic attack (TIA), and cerebral infarction without residual deficits: Secondary | ICD-10-CM | POA: Insufficient documentation

## 2017-05-26 DIAGNOSIS — Z9889 Other specified postprocedural states: Secondary | ICD-10-CM | POA: Diagnosis not present

## 2017-05-26 DIAGNOSIS — Z7901 Long term (current) use of anticoagulants: Secondary | ICD-10-CM | POA: Insufficient documentation

## 2017-05-26 DIAGNOSIS — I480 Paroxysmal atrial fibrillation: Secondary | ICD-10-CM | POA: Diagnosis not present

## 2017-05-26 DIAGNOSIS — K219 Gastro-esophageal reflux disease without esophagitis: Secondary | ICD-10-CM | POA: Insufficient documentation

## 2017-05-26 DIAGNOSIS — Z981 Arthrodesis status: Secondary | ICD-10-CM | POA: Insufficient documentation

## 2017-05-26 DIAGNOSIS — I119 Hypertensive heart disease without heart failure: Secondary | ICD-10-CM | POA: Insufficient documentation

## 2017-05-26 DIAGNOSIS — I248 Other forms of acute ischemic heart disease: Secondary | ICD-10-CM | POA: Diagnosis not present

## 2017-05-26 DIAGNOSIS — Z6835 Body mass index (BMI) 35.0-35.9, adult: Secondary | ICD-10-CM | POA: Insufficient documentation

## 2017-05-26 DIAGNOSIS — I2489 Other forms of acute ischemic heart disease: Secondary | ICD-10-CM

## 2017-05-26 DIAGNOSIS — Z885 Allergy status to narcotic agent status: Secondary | ICD-10-CM | POA: Insufficient documentation

## 2017-05-26 DIAGNOSIS — I259 Chronic ischemic heart disease, unspecified: Secondary | ICD-10-CM

## 2017-05-26 DIAGNOSIS — Z8249 Family history of ischemic heart disease and other diseases of the circulatory system: Secondary | ICD-10-CM | POA: Insufficient documentation

## 2017-05-26 DIAGNOSIS — I491 Atrial premature depolarization: Secondary | ICD-10-CM | POA: Insufficient documentation

## 2017-05-26 NOTE — ED Triage Notes (Signed)
Pt brought in via EMS with c/o chest pain that started at 2145 described as pressure rated 8/10 with EMS but has since resolved. Pt does have a hx of afib but is ST per EMS.

## 2017-05-27 ENCOUNTER — Emergency Department (HOSPITAL_COMMUNITY): Payer: Medicare Other

## 2017-05-27 DIAGNOSIS — R079 Chest pain, unspecified: Secondary | ICD-10-CM | POA: Diagnosis not present

## 2017-05-27 DIAGNOSIS — I471 Supraventricular tachycardia: Secondary | ICD-10-CM

## 2017-05-27 DIAGNOSIS — I248 Other forms of acute ischemic heart disease: Secondary | ICD-10-CM

## 2017-05-27 DIAGNOSIS — R0789 Other chest pain: Secondary | ICD-10-CM

## 2017-05-27 LAB — COMPREHENSIVE METABOLIC PANEL
ALT: 33 U/L (ref 17–63)
AST: 30 U/L (ref 15–41)
Albumin: 3.6 g/dL (ref 3.5–5.0)
Alkaline Phosphatase: 54 U/L (ref 38–126)
Anion gap: 9 (ref 5–15)
BUN: 23 mg/dL — ABNORMAL HIGH (ref 6–20)
CO2: 24 mmol/L (ref 22–32)
Calcium: 9.4 mg/dL (ref 8.9–10.3)
Chloride: 108 mmol/L (ref 101–111)
Creatinine, Ser: 1.15 mg/dL (ref 0.61–1.24)
GFR calc Af Amer: >60 mL/min (ref >60)
GFR calc non Af Amer: >60 mL/min (ref >60)
Glucose, Bld: 127 mg/dL — ABNORMAL HIGH (ref 65–99)
Potassium: 4.3 mmol/L (ref 3.5–5.1)
Sodium: 141 mmol/L (ref 135–145)
Total Bilirubin: 0.6 mg/dL (ref 0.3–1.2)
Total Protein: 6.2 g/dL — ABNORMAL LOW (ref 6.5–8.1)

## 2017-05-27 LAB — CBC WITH DIFFERENTIAL/PLATELET
Basophils Absolute: 0.1 10*3/uL (ref 0.0–0.1)
Basophils Relative: 1 %
Eosinophils Absolute: 0.2 10*3/uL (ref 0.0–0.7)
Eosinophils Relative: 3 %
HCT: 39.2 % (ref 39.0–52.0)
Hemoglobin: 12.8 g/dL — ABNORMAL LOW (ref 13.0–17.0)
Lymphocytes Relative: 37 %
Lymphs Abs: 2.2 10*3/uL (ref 0.7–4.0)
MCH: 30.1 pg (ref 26.0–34.0)
MCHC: 32.7 g/dL (ref 30.0–36.0)
MCV: 92.2 fL (ref 78.0–100.0)
Monocytes Absolute: 0.8 10*3/uL (ref 0.1–1.0)
Monocytes Relative: 13 %
Neutro Abs: 2.7 10*3/uL (ref 1.7–7.7)
Neutrophils Relative %: 46 %
Platelets: 197 10*3/uL (ref 150–400)
RBC: 4.25 MIL/uL (ref 4.22–5.81)
RDW: 13.9 % (ref 11.5–15.5)
WBC: 5.9 10*3/uL (ref 4.0–10.5)

## 2017-05-27 LAB — TROPONIN I
Troponin I: 0.03 ng/mL (ref <0.03)
Troponin I: 0.04 ng/mL (ref <0.03)

## 2017-05-27 MED ORDER — ACETAMINOPHEN 325 MG PO TABS: 650.0000 mg | ORAL_TABLET | ORAL | Status: DC | PRN

## 2017-05-27 MED ORDER — ADULT MULTIVITAMIN W/MINERALS CH: 1.0000 | ORAL_TABLET | Freq: Every day | ORAL | Status: DC

## 2017-05-27 MED ORDER — LISINOPRIL 10 MG PO TABS: 10.0000 mg | ORAL_TABLET | Freq: Every day | ORAL | Status: DC

## 2017-05-27 MED ORDER — PANTOPRAZOLE SODIUM 40 MG PO TBEC: 40.0000 mg | DELAYED_RELEASE_TABLET | Freq: Every day | ORAL | 11 refills | Status: DC

## 2017-05-27 MED ORDER — ATORVASTATIN CALCIUM 40 MG PO TABS
40.0000 mg | ORAL_TABLET | Freq: Every day | ORAL | Status: DC
  Filled 2017-05-27: qty 1

## 2017-05-27 MED ORDER — NITROGLYCERIN 0.4 MG SL SUBL: 0.4000 mg | SUBLINGUAL_TABLET | SUBLINGUAL | 5 refills | Status: DC | PRN

## 2017-05-27 MED ORDER — ONDANSETRON HCL 4 MG/2ML IJ SOLN: 4.0000 mg | Freq: Four times a day (QID) | INTRAMUSCULAR | Status: DC | PRN

## 2017-05-27 MED ORDER — METOPROLOL SUCCINATE ER 100 MG PO TB24
100.0000 mg | ORAL_TABLET | Freq: Every day | ORAL | Status: DC
  Filled 2017-05-27: qty 1

## 2017-05-27 MED ORDER — PANTOPRAZOLE SODIUM 40 MG PO TBEC: 40.0000 mg | DELAYED_RELEASE_TABLET | Freq: Every day | ORAL | Status: DC

## 2017-05-27 MED ORDER — FINASTERIDE 5 MG PO TABS
5.0000 mg | ORAL_TABLET | Freq: Every day | ORAL | Status: DC
  Filled 2017-05-27: qty 1

## 2017-05-27 MED ORDER — TAMSULOSIN HCL 0.4 MG PO CAPS: 0.4000 mg | ORAL_CAPSULE | Freq: Every day | ORAL | Status: DC

## 2017-05-27 MED ORDER — FENOFIBRATE 160 MG PO TABS
160.0000 mg | ORAL_TABLET | Freq: Every day | ORAL | Status: DC
  Filled 2017-05-27: qty 1

## 2017-05-27 NOTE — Discharge Summary (Signed)
Discharge Summary    Patient ID: Corey [name],  MRN: [id number], DOB/AGE: 70-Jul-1947 70 y.o.  Admit date: 05/26/2017 Discharge date: 05/27/2017  Primary Care Provider: Dorthy Cooler, [place] Primary Cardiologist: Dr. Acie Fredrickson    Discharge Diagnoses    Principal Problem:   SVT (supraventricular tachycardia) ([place]) Active Problems:   Demand ischemia ([place])   Morbid obesity ([place])   Allergies Allergies  Allergen Reactions  . Codeine Nausea Only and Other (See Comments)    Heavy amounts cause nausea  . Monopril [Fosinopril Sodium] Other (See Comments)    Muscle aches & pain  . Ampicillin Rash  . 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 He was in his normal state of health until earlier that evening when he went to walk his dog and started to feel a sudden onset of sub sternal chest pressure and sensation of his heart racing. The symptoms did feel like his previous angina. He did not have significant SOB, lightheadedness or diaphoresis. He took his vitals on his home monitor and found his pulse to be 140 which prompted him to call EMS.  He initially had regular narrow complex tachycardia in the ED which terminated spontaneously and converted to sinus rhythm. His troponin resulted borderline elevated at 0.03 and we were called for admission.  Hospital Course     Consultants: None  Due to lack of  inpatient beds Mr. Inabinet was admitted by the Cardiology Fellow overnight but remained in the ED until Dr. Marlou Porch saw him in the ER early this morning to re-evaluate him. He converted to sinus rhythm from SVT spontaneously and remained in sinus. The plan is to continue his beta blocker. He did have borderline elevated troponin's which peaked at 0.04  but he did not have any evidence of acute coronary syndrome and he had no chest pain. The elevated troponin is felt to be due to demand ischemia. The patient was counseled during his stay regarding his morbid obesity, we encouraged weight loss as this is likely playing a roll with his arrhythmia. The patient is feeling well this morning and Dr. Marlou Porch feels he is ready for discharge. His Prilosec was changed to Protonix and I provided a refill of his SL Nitroglycerin. He has been arranged close outpatient follow-up.  Discharge medications are listed below.  _____________  Discharge Vitals Blood pressure 134/86, pulse (!) 59, temperature 98.6 F (37 C), temperature source Oral, resp. rate 20, height 6\' 1"  (1.854 m), weight 266 lb (120.7 kg), SpO2 97 %.  Filed Weights   05/26/17 2307  Weight: 266 lb (120.7 kg)    Labs & Radiologic Studies     CBC  Recent Labs  05/27/17 0059  WBC 5.9  NEUTROABS 2.7  HGB 12.8*  HCT 39.2  MCV 92.2  PLT 703   Basic Metabolic Panel  Recent Labs  05/27/17 0059  NA 141  K 4.3  CL 108  CO2 24  GLUCOSE 127*  BUN 23*  CREATININE 1.15  CALCIUM 9.4   Liver Function Tests  Recent Labs  05/27/17 0059  AST 30  ALT 33  ALKPHOS 54  BILITOT 0.6  PROT 6.2*  ALBUMIN 3.6   No  results for input(s): LIPASE, AMYLASE in the last 72 hours. Cardiac Enzymes  Recent Labs  05/27/17 0059 05/27/17 0500  TROPONINI 0.03* 0.04*     Dg Chest 2 View  Result Date: 05/27/2017 CLINICAL DATA:  Non radiating chest pain. EXAM: CHEST  2 VIEW COMPARISON:  Chest radiographs 12/03/2014 FINDINGS: The cardiomediastinal contours are normal. Mild cardiomegaly and thoracic aortic atherosclerosis, stable. Pulmonary vasculature is normal. No consolidation, pleural effusion, or pneumothorax. No acute osseous abnormalities are seen. IMPRESSION: Stable mild cardiomegaly.  No acute abnormality. Electronically Signed   By: Jeb Levering M.D.   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 EXAM: CT ANGIOGRAPHY ABDOMEN TECHNIQUE: Multidetector CT imaging of the abdomen was performed using the standard protocol during bolus administration of intravenous contrast. Multiplanar reconstructed images and MIPs were obtained and reviewed to evaluate the vascular anatomy. CONTRAST:  75 mL Isovue 370 IV Creatinine was obtained on site at [facility] at [address] Results: Creatinine 1.0 mg/dL.  BUN 21, estimated GFR 76 mL/minute COMPARISON:  CT studies of the abdomen and pelvis on 06/04/2016 and 05/31/2016. FINDINGS: VASCULAR Aorta: Previously noted finding of the distal aorta at the level of the IMA origin is better characterized with arterial enhancement and is consistent with irregular plaque that is likely focally ulcerated. Margins of the aorta at this level are smooth, arguing against a penetrating ulcer and the aorta is not aneurysmal, measuring 2.6 cm in diameter at this level. Below the level of the IMA maximal caliber of the aorta is 2.6 x 2.7 cm. Celiac: Mild origin stenosis of approximately 30- 40%. SMA: Normally patent. Renals: Two separate left renal arteries with small upper pole accessory artery. Both demonstrate no significant stenosis. There are 3 separate right renal arteries with 2 small arteries emanating off of the mid aorta a third lower pole accessory artery emanating off of the distal aorta just below the IMA origin. No significant right-sided renal artery stenosis. IMA: Normally patent. Inflow: Scattered calcified plaque in the common iliac arteries without aneurysm or stenosis. Visualized proximal segments of the internal and external iliac arteries bilaterally show no significant disease. Review of the MIP images confirms the above findings. NON-VASCULAR Lower  chest: No acute abnormality. Hepatobiliary: No focal liver abnormality is seen. No gallstones, gallbladder wall thickening, or biliary dilatation. Pancreas: Unremarkable. No pancreatic ductal dilatation or surrounding inflammatory changes. Spleen: Normal in size without focal abnormality. Adrenals/Urinary Tract: Adrenal glands are unremarkable. Kidneys are normal, without renal calculi, focal lesion, or hydronephrosis. Bladder is unremarkable. Stomach/Bowel: Bowel is unremarkable and shows no evidence of obstruction, inflammation or lesion. No free air. Lymphatic: No enlarged lymph nodes identified in the abdomen. Other: No abdominal wall hernia or abnormality. No abscess or ascites. Musculoskeletal: Prior posterior lumbar fusion at L5-S1. IMPRESSION: VASCULAR The aortic abnormality seen by prior standard CT is felt to represent irregular atherosclerotic plaque that appears to be partially ulcerated. This is not an aneurysm and also likely does not represent a penetrating ulcer given lack of protrusion beyond the expected contour of the aortic lumen. The aorta at this level also is normal in diameter and there is no evidence of aneurysmal disease of the abdominal aorta. It is debatable whether this finding needs to be followed. This can be treated as if a nearly 3 cm  abdominal aortic aneurysm with follow-up ultrasound of the aorta in 3 years. NON-VASCULAR No significant nonvascular findings. Electronically Signed   By: Aletta Edouard M.D.   On: 05/12/2017 23:39     Diagnostic Studies/Procedures    ECHOCARDIOGRAM 12/04/14  Study Conclusions  - Left ventricle: Technically difficult study. The cavity size was normal. Wall thickness was increased in a pattern of moderate LVH. The estimated ejection fraction was 60%. Regional wall motion abnormalities cannot be excluded. - Right ventricle: Poorly visualized.   Carotid Duplex 07/16/2014 Notes Recorded by Thayer Headings, MD on 07/16/2014 at 5:55  PM Mild bilateral plaque *Repeat carotid duplex in 1 year*  _____________    Disposition   Pt is being discharged home today in good condition.  Follow-up Plans & Appointments    Follow-up Information    Liliane Shi, PA-C. Go on 06/21/2017.   Specialties:  Cardiology, Physician Assistant Why:  Please go to follow-up appointment at 10:45 am. Arrive 10-15 minutes early to your appointment Contact information: [address] [place] [address] [phone]          Discharge Instructions    Diet - low sodium heart healthy    Complete by:  As directed    Increase activity slowly    Complete by:  As directed    May shower / Bathe    Complete by:  As directed       Discharge Medications   Allergies as of 05/27/2017      Reactions   Codeine Nausea Only, Other (See Comments)   Heavy amounts cause nausea   Monopril [fosinopril Sodium] Other (See Comments)   Muscle aches & pain   Ampicillin Rash   Percocet [oxycodone-acetaminophen] Nausea And Vomiting      Medication List    STOP taking these medications   omeprazole 10 MG capsule Commonly known as:  PRILOSEC Replaced by:  pantoprazole 40 MG tablet     TAKE these medications   atorvastatin 40 MG tablet Commonly known as:  LIPITOR TAKE 1 TABLET BY MOUTH EVERY DAY   fenofibrate 160 MG tablet TAKE 1 TABLET (160 MG TOTAL) BY MOUTH DAILY.   finasteride 5 MG tablet Commonly known as:  PROSCAR Take 5 mg by mouth daily.   FLOMAX 0.4 MG Caps capsule Generic drug:  tamsulosin Take 0.4 mg by mouth daily.   lisinopril 10 MG tablet Commonly known as:  PRINIVIL,ZESTRIL Take 10 mg by mouth daily.   metoprolol succinate 50 MG 24 hr tablet Commonly known as:  TOPROL-XL Take 2 tablets (100 mg total) by mouth daily.   multivitamins ther. w/minerals Tabs tablet Take 1 tablet by mouth daily.   nitroGLYCERIN 0.4 MG SL tablet Commonly known as:  NITROSTAT Place 1 tablet (0.4 mg total) under  the tongue every 5 (five) minutes as needed for chest pain.   pantoprazole 40 MG tablet Commonly known as:  PROTONIX Take 1 tablet (40 mg total) by mouth daily. Replaces:  omeprazole 10 MG capsule   PROBIOTIC PO Take 1 capsule by mouth 2 (two) times daily.   traMADol-acetaminophen 37.5-325 MG tablet Commonly known as:  ULTRACET Take 1 tablet by mouth every 6 (six) hours as needed for moderate pain.   TYLENOL 8 HOUR ARTHRITIS PAIN 650 MG CR tablet Generic drug:  acetaminophen Take 1,300 mg by mouth 2 (two) times daily as needed for pain (pain).   XARELTO 20 MG Tabs tablet Generic drug:  rivaroxaban TAKE 1 TABLET BY MOUTH  EVERY DAY            Discharge Care Instructions        Start     Ordered   05/27/17 0000  pantoprazole (PROTONIX) 40 MG tablet  Daily     05/27/17 0937   05/27/17 0000  nitroGLYCERIN (NITROSTAT) 0.4 MG SL tablet  Every 5 min PRN     05/27/17 0937   05/27/17 0000  Increase activity slowly     05/27/17 0937   05/27/17 0000  May shower / Bathe     05/27/17 0937   05/27/17 0000  Diet - low sodium heart healthy     05/27/17 8657       Outstanding Labs/Studies   Pt is past due for carotid duplex, (incidental finding)  Duration of Discharge Encounter   Greater than 30 minutes including physician time.  Signed, Linus Mako PA-C 05/27/2017, 10:51 AM   Subjective   No chest pain. No shortness of breath. Awaiting discharge home. He has a friend tonight that is having a 50th wedding anniversary. He states that he has celebrated his 54rd.  Inpatient Medications    Scheduled Meds: . atorvastatin  40 mg Oral Daily  . fenofibrate  160 mg Oral Daily  . finasteride  5 mg Oral Daily  . lisinopril  10 mg Oral Daily  . metoprolol succinate  100 mg Oral Daily  . multivitamin with minerals  1 tablet Oral Daily  . pantoprazole  40 mg Oral Daily  . tamsulosin  0.4 mg Oral Daily   Continuous Infusions: PRN Meds: acetaminophen, ondansetron  (ZOFRAN) IV   Vital Signs          Vitals:   05/27/17 0430 05/27/17 0515 05/27/17 0711 05/27/17 0716  BP: 108/65 115/78  135/65  Pulse: (!) 59 63  65  Resp: 18 17  16   Temp:      TempSrc:      SpO2: 96% 94% 99% 99%  Weight:      Height:       No intake or output data in the 24 hours ending 05/27/17 0910    Filed Weights   05/26/17 2307  Weight: 266 lb (120.7 kg)    Telemetry    Sinus rhythm/sinus bradycardia - Personally Reviewed  ECG    Initial EKG demonstrates narrow complex tachycardia rate 141 bpm with no ischemic changes, possible 2to1 flutter.- Personally Reviewed  Physical Exam   GEN:No acute distress.   Neck:No JVD Cardiac:RRR, no murmurs, rubs, or gallops.  Respiratory:Clear to auscultation bilaterally. QI:ONGE, nontender, non-distended  MS:No edema; No deformity. Neuro:Nonfocal  Psych: Normal affect   Labs    Chemistry Last Labs    Recent Labs Lab 05/27/17 0059  NA 141  K 4.3  CL 108  CO2 24  GLUCOSE 127*  BUN 23*  CREATININE 1.15  CALCIUM 9.4  PROT 6.2*  ALBUMIN 3.6  AST 30  ALT 33  ALKPHOS 54  BILITOT 0.6  GFRNONAA >60  GFRAA >60  ANIONGAP 9       Hematology Last Labs    Recent Labs Lab 05/27/17 0059  WBC 5.9  RBC 4.25  HGB 12.8*  HCT 39.2  MCV 92.2  MCH 30.1  MCHC 32.7  RDW 13.9  PLT 197      Cardiac Enzymes Last Labs    Recent Labs Lab 05/27/17 0059 05/27/17 0500  TROPONINI 0.03* 0.04*      Last Labs   No results for input(s): TROPIPOC in  the last 168 hours.     BNP Last Labs   No results for input(s): BNP, PROBNP in the last 168 hours.     DDimer  Last Labs   No results for input(s): DDIMER in the last 168 hours.     Radiology     Imaging Results (Last 48 hours)  Dg Chest 2 View  Result Date: 05/27/2017 CLINICAL DATA:  Non radiating chest pain. EXAM: CHEST  2 VIEW COMPARISON:  Chest radiographs 12/03/2014 FINDINGS: The cardiomediastinal  contours are normal. Mild cardiomegaly and thoracic aortic atherosclerosis, stable. Pulmonary vasculature is normal. No consolidation, pleural effusion, or pneumothorax. No acute osseous abnormalities are seen. IMPRESSION: Stable mild cardiomegaly.  No acute abnormality. Electronically Signed   By: Jeb Levering M.D.   On: 05/27/2017 01:35     Cardiac Studies   Low-level troponin 0.03/0.04. Prior echocardiogram 2016 normal ejection fraction.  Patient Profile     71 y.o. male with supraventricular tachycardia, possible atrial flutter transiently with auto conversion  Assessment & Plan    SVT  - Spontaneous conversion.  - Continue with beta blocker.  - Okay with discharge home. Xarelto  Demand ischemia  - Very low level flat troponin.  - No evidence of acute coronary syndrome. No chest pain.  - This low-level troponin increase was secondary to tachycardia transiently.  Morbid obesity  - Encourage weight loss. This is playing a role with his arrhythmia.  Okay for discharge. Discussed with patient. Have follow-up with cardiology in the next 2-4 weeks  Signed, Candee Furbish, MD  05/27/2017, 9:10 AM

## 2017-05-27 NOTE — H&P (Signed)
Cardiology History & Physical    Patient ID: Corey [name] MRN: [id number], DOB: 10/21/1945 Date of Encounter: 05/27/2017, 3:02 AM Primary Physician: [name], MD Primary Cardiologist: Dr. Acie Fredrickson  Chief Complaint: Chest pain, tachycardia  HPI: Corey [name] is a 71 y.o. male with history of CAD s/p BMS to RCA x2, pAF on Xarelto, prior stroke, who presents following an episode of chest pain and tachycardia.  Pt was doing well until this evening; he went to walk the dog and felt the sudden onset of SSCP and the sensation his heart was racing.  He said this felt like prior episodes of anginal pain.  He denied associated lightheadedness, significant SOB, or diaphoresis.  After getting back from the walk, his pulse was in the 140s on home monitoring, so he called EMS.  In the ED, he was initially in a regular narrow complex tachycardia that spontaneously terminated.  He was in sinus rhythm thereafter and his chest pressure subsided.  Labs showed a borderline positive troponin at 0.03, and were otherwise WNL.  Past Medical History:  Diagnosis Date  . Arthritis    "back, right knee, hands, ankles, neck" (05/31/2016)  . Atrial fibrillation with RVR ([place])   . Chronic lower back pain   . Coronary atherosclerosis of native coronary artery    a. BMS to [facility] 2004 and 2007, otherwise mild nonobstructive disease. EF normal.  . CVA (cerebral vascular accident) ([place]) 07/2012   AF discovered after stroke; denies residual on 12/04/2014  . Diverticulitis   . Dyslipidemia   . Essential hypertension, benign   . GERD (gastroesophageal reflux disease)   . Lumbar radiculopathy, chronic 02/04/2015   Right L5  . Obesity   . OSA on CPAP   . Paroxysmal atrial fibrillation ([place])    a. Discovered after stroke.  . Pneumonia 01/1996  . PONV (postoperative nausea and vomiting)   . Recurrent upper respiratory infection (URI)      Surgical History:  Past Surgical History:  Procedure Laterality Date  . ANTERIOR  CERVICAL DECOMP/DISCECTOMY FUSION  07/2001; 10/2002   "C5-6; C6-7; redo"  . BACK SURGERY    . CARPAL TUNNEL RELEASE Left 10/2015  . COLONOSCOPY W/ POLYPECTOMY  02/2014  . CORONARY ANGIOPLASTY WITH STENT PLACEMENT  05/2003; 12/2005   "mid RCA; mid RCA"  . JOINT REPLACEMENT    . KNEE ARTHROSCOPY Left 10/2005  . KNEE ARTHROSCOPY W/ PARTIAL MEDIAL MENISCECTOMY Left 09/2005  . LUMBAR LAMINECTOMY/DECOMPRESSION MICRODISCECTOMY  03/2005   "L4-5"  . POSTERIOR LUMBAR FUSION  10/2003   L5-S1; "plates, screws"  . SHOULDER ARTHROSCOPY Right 08/2011   Debridement of labrum, arthroscopic distal clavicle excision  . SHOULDER OPEN ROTATOR CUFF REPAIR Left 07/2014  . TEE WITHOUT CARDIOVERSION  07/07/2012   Procedure: TRANSESOPHAGEAL ECHOCARDIOGRAM (TEE);  Surgeon: Fay Records, MD;  Location: [facility];  Service: Cardiovascular;  Laterality: N/A;  . TONSILLECTOMY AND ADENOIDECTOMY  ~ 1956  . TOTAL KNEE ARTHROPLASTY Left 10/2006  . TRIGGER FINGER RELEASE Left 10/2015     Home Meds: Prior to Admission medications   Medication Sig Start Date End Date Taking? Authorizing Provider  acetaminophen (TYLENOL 8 HOUR ARTHRITIS PAIN) 650 MG CR tablet Take 1,300 mg by mouth 2 (two) times daily as needed for pain (pain).   Yes [provider]  atorvastatin (LIPITOR) 40 MG tablet TAKE 1 TABLET BY MOUTH EVERY DAY 04/11/17  Yes Nahser, Wonda Cheng, MD  fenofibrate 160 MG tablet TAKE 1 TABLET (160 MG TOTAL)  BY MOUTH DAILY. 01/10/17  Yes Nahser, Wonda Cheng, MD  finasteride (PROSCAR) 5 MG tablet Take 5 mg by mouth daily. 02/10/17  Yes [provider]  lisinopril (PRINIVIL,ZESTRIL) 10 MG tablet Take 10 mg by mouth daily. 12/17/14  Yes [provider]  metoprolol succinate (TOPROL-XL) 50 MG 24 hr tablet Take 2 tablets (100 mg total) by mouth daily. 12/01/16  Yes Nahser, Wonda Cheng, MD  Multiple Vitamins-Minerals (MULTIVITAMINS THER. W/MINERALS) TABS Take 1 tablet by mouth daily.     Yes [provider]    nitroGLYCERIN (NITROSTAT) 0.4 MG SL tablet Place 1 tablet (0.4 mg total) under the tongue every 5 (five) minutes as needed for chest pain. 02/03/17  Yes Nahser, Wonda Cheng, MD  omeprazole (PRILOSEC) 10 MG capsule TAKE ONE CAPSULE BY MOUTH EVERY DAY 02/01/17  Yes Nahser, Wonda Cheng, MD  Probiotic Product (PROBIOTIC PO) Take 1 capsule by mouth 2 (two) times daily.   Yes [provider]  Tamsulosin HCl (FLOMAX) 0.4 MG CAPS Take 0.4 mg by mouth daily.     Yes [provider]  XARELTO 20 MG TABS tablet TAKE 1 TABLET BY MOUTH EVERY DAY 02/21/17  Yes Nahser, Wonda Cheng, MD  traMADol-acetaminophen (ULTRACET) 37.5-325 MG tablet Take 1 tablet by mouth every 6 (six) hours as needed for moderate pain. Patient not taking: Reported on 05/26/2017 06/02/16   Corey Cellar, MD    Allergies:  Allergies  Allergen Reactions  . Codeine Nausea Only and Other (See Comments)    Heavy amounts cause nausea  . Monopril [Fosinopril Sodium] Other (See Comments)    Muscle aches & pain  . Ampicillin Rash  . Percocet [Oxycodone-Acetaminophen] Nausea And Vomiting    Social History   Social History  . Marital status: Married    Spouse name: Corey [name]  . Number of children: 3  . Years of education: 14   Occupational History  . Retired Disabled     Works at [employer] part time   Social History Main Topics  . Smoking status: Former Smoker    Packs/day: 1.00    Years: 34.00    Types: Cigarettes    Quit date: 05/05/2003  . Smokeless tobacco: Never Used  . Alcohol use No  . Drug use: No  . Sexual activity: Yes   Other Topics Concern  . Not on file   Social History Narrative   Patient lives at home with spouse.   Caffeine Use:      Family History  Problem Relation Age of Onset  . Hypertension Mother   . Heart disease Father   . Heart attack Father     Review of Systems: All other systems reviewed and are otherwise negative except as noted above.  Labs:   Lab Results  Component Value Date    WBC 5.9 05/27/2017   HGB 12.8 (L) 05/27/2017   HCT 39.2 05/27/2017   MCV 92.2 05/27/2017   PLT 197 05/27/2017    Recent Labs Lab 05/27/17 0059  NA 141  K 4.3  CL 108  CO2 24  BUN 23*  CREATININE 1.15  CALCIUM 9.4  PROT 6.2*  BILITOT 0.6  ALKPHOS 54  ALT 33  AST 30  GLUCOSE 127*    Recent Labs  05/27/17 0059  TROPONINI 0.03*   Lab Results  Component Value Date   CHOL 116 08/30/2016   HDL 31 (L) 08/30/2016   LDLCALC 64 08/30/2016   TRIG 105 08/30/2016   No results found for: DDIMER  Radiology/Studies:  Dg Chest 2 View  Result Date: 05/27/2017 CLINICAL DATA:  Non radiating chest pain. EXAM: CHEST  2 VIEW COMPARISON:  Chest radiographs 12/03/2014 FINDINGS: The cardiomediastinal contours are normal. Mild cardiomegaly and thoracic aortic atherosclerosis, stable. Pulmonary vasculature is normal. No consolidation, pleural effusion, or pneumothorax. No acute osseous abnormalities are seen. IMPRESSION: Stable mild cardiomegaly.  No acute abnormality. Electronically Signed   By: Jeb Levering M.D.   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 EXAM: CT ANGIOGRAPHY ABDOMEN TECHNIQUE: Multidetector CT imaging of the abdomen was performed using the standard protocol during bolus administration of intravenous contrast. Multiplanar reconstructed images and MIPs were obtained and reviewed to evaluate the vascular anatomy. CONTRAST:  75 mL Isovue 370 IV Creatinine was obtained on site at [place] at [address] Results: Creatinine 1.0 mg/dL.  BUN 21, estimated GFR 76 mL/minute COMPARISON:  CT studies of the abdomen and pelvis on 06/04/2016 and 05/31/2016. FINDINGS: VASCULAR Aorta: Previously noted finding of the distal aorta at the level of the IMA origin is better characterized  with arterial enhancement and is consistent with irregular plaque that is likely focally ulcerated. Margins of the aorta at this level are smooth, arguing against a penetrating ulcer and the aorta is not aneurysmal, measuring 2.6 cm in diameter at this level. Below the level of the IMA maximal caliber of the aorta is 2.6 x 2.7 cm. Celiac: Mild origin stenosis of approximately 30- 40%. SMA: Normally patent. Renals: Two separate left renal arteries with small upper pole accessory artery. Both demonstrate no significant stenosis. There are 3 separate right renal arteries with 2 small arteries emanating off of the mid aorta a third lower pole accessory artery emanating off of the distal aorta just below the IMA origin. No significant right-sided renal artery stenosis. IMA: Normally patent. Inflow: Scattered calcified plaque in the common iliac arteries without aneurysm or stenosis. Visualized proximal segments of the internal and external iliac arteries bilaterally show no significant disease. Review of the MIP images confirms the above findings. NON-VASCULAR Lower chest: No acute abnormality. Hepatobiliary: No focal liver abnormality is seen. No gallstones, gallbladder wall thickening, or biliary dilatation. Pancreas: Unremarkable. No pancreatic ductal dilatation or surrounding inflammatory changes. Spleen: Normal in size without focal abnormality. Adrenals/Urinary Tract: Adrenal glands are unremarkable. Kidneys are normal, without renal calculi, focal lesion, or hydronephrosis. Bladder is unremarkable. Stomach/Bowel: Bowel is unremarkable and shows no evidence of obstruction, inflammation or lesion. No free air. Lymphatic: No enlarged lymph nodes identified in the abdomen. Other: No abdominal wall hernia or abnormality. No abscess or ascites. Musculoskeletal: Prior posterior lumbar fusion at L5-S1. IMPRESSION: VASCULAR The aortic abnormality seen by prior standard CT is felt to represent irregular atherosclerotic  plaque that appears to be partially ulcerated. This is not an aneurysm and also likely does not represent a penetrating ulcer given lack of protrusion beyond the expected contour of the aortic lumen. The aorta at this level also is normal in diameter and there is no evidence of aneurysmal disease of the abdominal aorta. It is debatable whether this finding needs to be followed. This can be treated as if a nearly 3 cm abdominal aortic aneurysm with follow-up ultrasound of the aorta in 3 years. NON-VASCULAR No significant nonvascular findings. Electronically Signed   By: Jenness Corner.D.  On: 05/12/2017 23:39   Wt Readings from Last 3 Encounters:  05/26/17 120.7 kg (266 lb)  03/14/17 125.2 kg (276 lb)  10/15/16 124.7 kg (275 lb)    EKG: Initially regular narrow complex tachycardia, rate of 141, no ischemic changes, possibly c/w atrial flutter with 2:1 AVB.  Awaiting subsequent 12 lead ECG; telemetry showing NSR.  Physical Exam: Blood pressure 108/71, pulse 66, temperature 98.6 F (37 C), temperature source Oral, resp. rate 15, height 6\' 1"  (1.854 m), weight 120.7 kg (266 lb), SpO2 97 %. Body mass index is 35.09 kg/m. General: Well developed, well nourished, in no acute distress. Head: Normocephalic, atraumatic, sclera non-icteric, no xanthomas, nares are without discharge.  Neck: Negative for carotid bruits. JVD not elevated. Lungs: Clear bilaterally to auscultation without wheezes, rales, or rhonchi. Breathing is unlabored. Heart: RRR with S1 S2. No murmurs, rubs, or gallops appreciated. Abdomen: Soft, non-tender, non-distended with normoactive bowel sounds. No hepatomegaly. No rebound/guarding. No obvious abdominal masses. Msk:  Strength and tone appear normal for age. Extremities: No clubbing or cyanosis. No edema.  Distal pedal pulses are 2+ and equal bilaterally. Neuro: Alert and oriented X 3. No focal deficit. No facial asymmetry. Moves all extremities spontaneously. Psych:  Responds  to questions appropriately with a normal affect.    Assessment and Plan   71 y.o. male with history of CAD s/p BMS to RCA x2, pAF on Xarelto, prior stroke, who presents following an episode of chest pain and tachycardia.   1.  SVT:  Likely  Atrial flutter with 2:1 AVB, spontaneously converted to sinus.  Last dose of Xarelto on 8/23 at 6:30PM, will await biomarker trend before resuming Xarelto.  Continue home metoprolol.  2.  Chest pain:  Borderline positive troponin, likely demand ischemia in setting of tachycardia.  Primary ACS much less likely. Continue to cycle biomarkers; consider cath while inpatient if rapidly uptrending.  If trend is flat, would be reasonable to follow up on outpatient basis to consider further work up.  3.  HTN:  Continue home metoprolol and lisinopril.  4.  HLD:  Continue home statin.  Merrilee Seashore MD 05/27/2017, 3:02 AM

## 2017-05-27 NOTE — ED Provider Notes (Signed)
[facility] DEPT Provider Note   CSN: [id number] Arrival date & time: 05/26/17  2258     History   Chief Complaint Chief Complaint  Patient presents with  . Chest Pain  . Tachycardia    HPI Corey [name] is a 71 y.o. male.  HPI   Patient is a 71 year old male presenting with feelings of tachycardia. Patient has history of paroxysmal A. fib. On xarlto, on metoprolol 50 mg twice a day. The patient has history of CAD, stent RCA. Patient also has history of obesity, hypertension, stroke. Patient is here today because he was in his usual state of health and then all of a sudden felt central chest pressure. The chest pressure lasted around 5 minutes. Did not radiate anywhere no diaphoresis but he did feel heaviness "similar to muy last heart attack". Patient is a patient of Dr. Katharina Caper.  Past Medical History:  Diagnosis Date  . Arthritis    "back, right knee, hands, ankles, neck" (05/31/2016)  . Atrial fibrillation with RVR ([place])   . Chronic lower back pain   . Coronary atherosclerosis of native coronary artery    a. BMS to [facility] 2004 and 2007, otherwise mild nonobstructive disease. EF normal.  . CVA (cerebral vascular accident) ([place]) 07/2012   AF discovered after stroke; denies residual on 12/04/2014  . Diverticulitis   . Dyslipidemia   . Essential hypertension, benign   . GERD (gastroesophageal reflux disease)   . Lumbar radiculopathy, chronic 02/04/2015   Right L5  . Obesity   . OSA on CPAP   . Paroxysmal atrial fibrillation ([place])    a. Discovered after stroke.  . Pneumonia 01/1996  . PONV (postoperative nausea and vomiting)   . Recurrent upper respiratory infection (URI)     Patient Active Problem List   Diagnosis Date Noted  . PAF (paroxysmal atrial fibrillation) ([place]) 08/30/2016  . Carotid artery disease ([place]) 08/30/2016  . Essential hypertension 08/30/2016  . Diverticulitis of intestine without perforation or abscess without bleeding   . Hyponatremia 06/04/2016  .  Diverticulitis 05/31/2016  . Iron deficiency 01/05/2016  . Lumbar radiculopathy, chronic 02/04/2015  . PAC (premature atrial contraction) 04/05/2014  . OSA (obstructive sleep apnea) 03/27/2014  . CVA (cerebral infarction) 07/06/2012  . Coronary artery disease   . Dyslipidemia   . GERD (gastroesophageal reflux disease)     Past Surgical History:  Procedure Laterality Date  . ANTERIOR CERVICAL DECOMP/DISCECTOMY FUSION  07/2001; 10/2002   "C5-6; C6-7; redo"  . BACK SURGERY    . CARPAL TUNNEL RELEASE Left 10/2015  . COLONOSCOPY W/ POLYPECTOMY  02/2014  . CORONARY ANGIOPLASTY WITH STENT PLACEMENT  05/2003; 12/2005   "mid RCA; mid RCA"  . JOINT REPLACEMENT    . KNEE ARTHROSCOPY Left 10/2005  . KNEE ARTHROSCOPY W/ PARTIAL MEDIAL MENISCECTOMY Left 09/2005  . LUMBAR LAMINECTOMY/DECOMPRESSION MICRODISCECTOMY  03/2005   "L4-5"  . POSTERIOR LUMBAR FUSION  10/2003   L5-S1; "plates, screws"  . SHOULDER ARTHROSCOPY Right 08/2011   Debridement of labrum, arthroscopic distal clavicle excision  . SHOULDER OPEN ROTATOR CUFF REPAIR Left 07/2014  . TEE WITHOUT CARDIOVERSION  07/07/2012   Procedure: TRANSESOPHAGEAL ECHOCARDIOGRAM (TEE);  Surgeon: Fay Records, MD;  Location: [facility];  Service: Cardiovascular;  Laterality: N/A;  . TONSILLECTOMY AND ADENOIDECTOMY  ~ 1956  . TOTAL KNEE ARTHROPLASTY Left 10/2006  . TRIGGER FINGER RELEASE Left 10/2015       Home Medications    Prior to Admission medications   Medication Sig Start  Date End Date Taking? Authorizing Provider  acetaminophen (TYLENOL 8 HOUR ARTHRITIS PAIN) 650 MG CR tablet Take 1,300 mg by mouth 2 (two) times daily as needed for pain (pain).   Yes [provider]  atorvastatin (LIPITOR) 40 MG tablet TAKE 1 TABLET BY MOUTH EVERY DAY 04/11/17  Yes Nahser, Wonda Cheng, MD  fenofibrate 160 MG tablet TAKE 1 TABLET (160 MG TOTAL) BY MOUTH DAILY. 01/10/17  Yes Nahser, Wonda Cheng, MD  finasteride (PROSCAR) 5 MG tablet Take 5 mg by mouth daily.  02/10/17  Yes [provider]  lisinopril (PRINIVIL,ZESTRIL) 10 MG tablet Take 10 mg by mouth daily. 12/17/14  Yes [provider]  metoprolol succinate (TOPROL-XL) 50 MG 24 hr tablet Take 2 tablets (100 mg total) by mouth daily. 12/01/16  Yes Nahser, Wonda Cheng, MD  Multiple Vitamins-Minerals (MULTIVITAMINS THER. W/MINERALS) TABS Take 1 tablet by mouth daily.     Yes [provider]  nitroGLYCERIN (NITROSTAT) 0.4 MG SL tablet Place 1 tablet (0.4 mg total) under the tongue every 5 (five) minutes as needed for chest pain. 02/03/17  Yes Nahser, Wonda Cheng, MD  omeprazole (PRILOSEC) 10 MG capsule TAKE ONE CAPSULE BY MOUTH EVERY DAY 02/01/17  Yes Nahser, Wonda Cheng, MD  Probiotic Product (PROBIOTIC PO) Take 1 capsule by mouth 2 (two) times daily.   Yes [provider]  Tamsulosin HCl (FLOMAX) 0.4 MG CAPS Take 0.4 mg by mouth daily.     Yes [provider]  XARELTO 20 MG TABS tablet TAKE 1 TABLET BY MOUTH EVERY DAY 02/21/17  Yes Nahser, Wonda Cheng, MD  traMADol-acetaminophen (ULTRACET) 37.5-325 MG tablet Take 1 tablet by mouth every 6 (six) hours as needed for moderate pain. Patient not taking: Reported on 05/26/2017 06/02/16   Kelvin Cellar, MD    Family History Family History  Problem Relation Age of Onset  . Hypertension Mother   . Heart disease Father   . Heart attack Father     Social History Social History  Substance Use Topics  . Smoking status: Former Smoker    Packs/day: 1.00    Years: 34.00    Types: Cigarettes    Quit date: 05/05/2003  . Smokeless tobacco: Never Used  . Alcohol use No     Allergies   Codeine; Monopril [fosinopril sodium]; Ampicillin; and Percocet [oxycodone-acetaminophen]   Review of Systems Review of Systems  Constitutional: Negative for activity change.  Respiratory: Positive for chest tightness. Negative for shortness of breath.   Cardiovascular: Positive for chest pain.  Gastrointestinal: Negative for abdominal pain.    All other systems reviewed and are negative.    Physical Exam Updated Vital Signs BP 127/72 (BP Location: Right Arm)   Pulse 85   Temp 98.6 F (37 C) (Oral)   Resp 18   Ht 6\' 1"  (1.854 m)   Wt 120.7 kg (266 lb)   SpO2 95%   BMI 35.09 kg/m   Physical Exam  Constitutional: He is oriented to person, place, and time. He appears well-nourished.  HENT:  Head: Normocephalic.  Eyes: Conjunctivae are normal. Right eye exhibits no discharge. Left eye exhibits no discharge.  Cardiovascular: Normal rate and regular rhythm.   No murmur heard. Pulmonary/Chest: Effort normal and breath sounds normal. No respiratory distress.  Abdominal: Soft. He exhibits no distension. There is no tenderness.  Neurological: He is oriented to person, place, and time.  Skin: Skin is warm and dry. He is not diaphoretic.  Psychiatric: He has a normal mood and  affect. His behavior is normal.     ED Treatments / Results  Labs (all labs ordered are listed, but only abnormal results are displayed) Labs Reviewed  COMPREHENSIVE METABOLIC PANEL  CBC WITH DIFFERENTIAL/PLATELET  TROPONIN I    EKG  EKG Interpretation  Date/Time:  Thursday May 26 2017 23:09:35 EDT Ventricular Rate:  141 PR Interval:    QRS Duration: 90 QT Interval:  328 QTC Calculation: 502 R Axis:   -20 Text Interpretation:  Supraventricular tachycardia Indeterminate axis Inferior infarct , age undetermined Abnormal ECG high heart rate.  similar to previous  Confirmed by [facility], Jossette Zirbel [id number]) on 05/26/2017 11:36:51 PM       Radiology No results found.  Procedures Procedures (including critical care time)  Medications Ordered in ED Medications - No data to display   Initial Impression / Assessment and Plan / ED Course  I have reviewed the triage vital signs and the nursing notes.  Pertinent labs & imaging results that were available during my care of the patient were reviewed by me and considered in my medical decision  making (see chart for details).     Patient 71 year old male presenting with chest pressure, tachycardia. On arrival here patient did have a high heart rate. Patient since converted back into regular heart rhythm. Patient essentially had a  stress test today with elevated heart rate and had anginal symptoms. We'll discuss with cardiology.  Cards will admit.   Final Clinical Impressions(s) / ED Diagnoses   Final diagnoses:  None    New Prescriptions New Prescriptions   No medications on file     Macarthur Critchley, MD 05/27/17 [phone]

## 2017-05-27 NOTE — Discharge Instructions (Signed)
Supraventricular Tachycardia, Adult Supraventricular tachycardia (SVT) is a type of abnormal heart rhythm. It causes the heart to beat very quickly and then return to a normal speed. A normal heart rate is 60-100 beats per minute. During an episode of SVT, your heart rate may be higher than 150 beats per minute. Episodes of SVT can be frightening, but they are usually not dangerous. However, if episodes happens often or last for long periods of time, they may lead to heart failure. What are the causes? Usually, a normal heartbeat starts when an area called the sinoatrial node releases an electrical signal. In SVT, other areas of the heart send out electrical signals that interfere with the signal from the sinoatrial node. It is not known why some people get SVT and others do not. What increases the risk? This condition is more likely to develop in:  People who are 33?70 years old.  Women.  Factors that may increase your chances of an attack include:  Stress.  Tiredness.  Smoking.  Stimulant drugs, such as cocaine and methamphetamine.  Alcohol.  Caffeine.  Pregnancy.  Anxiety.  What are the signs or symptoms? Symptoms of this condition include:  A pounding heart.  A feeling that the heart is skipping beats (palpitations).  Weakness.  Shortness of breath.  Tightness or pain in your chest.  Light-headedness.  Anxiety.  Dizziness.  Sweating.  Nausea.  Fainting.  Fatigue or tiredness.  A mild episode may not cause symptoms. How is this diagnosed? This condition may be diagnosed based on:  Your symptoms.  A physical exam. If you are have an episode of SVT during the exam, the health care provider may be able to diagnose SVT by listening to your heart and feeling your pulse.  Tests. These may include: ? An electrocardiogram (ECG). This test is done to check for problems with electrical activity in the heart. ? A Holter monitor or event monitor test. This  test involves wearing a portable device that monitors your heart rate over time. ? An echocardiogram. This test involves taking an image of your heart using sound waves. It is done to rule out other causes of a fast heart rate. ? Blood tests.  How is this treated? This condition may be treated with:  Vagal nerve stimulation. The treatment involves stimulating your vagus nerve, which slows down the heart. It is often the first and only treatment that is needed for this condition. It is a good idea to try the several ways of doing vagal stimulation to find which one works best for you. Ways to do this treatment include: ? Holding your breath and pushing, as though you are having a bowel movement. ? Massaging an area on one side of your neck, below your jaw. Do not try this yourself. Only a health care provider should do this. If done the wrong way, it can lead to a stroke. ? Bending forward with your head between your legs. ? Coughing while bending forward with your head between your legs. ? Closing your eyes and massaging your eyeballs. A health care provider should guide you through this method before you try it on your own.  Medicines that prevent attacks.  Medicine to stop an attack. The medicine is given through an IV tube at the hospital.  A small electric shock (cardioversion) that stops an attack. Before you get the shock, you will get medicine to make you fall asleep.  Radiofrequency ablation. In this procedure, a small, thin tube (catheter)  is used to send radiofrequency energy to the area of tissue that is causing the rapid heartbeats. The energy kills the cells and helps your heart keep a normal rhythm. You may have this treatment if you have symptoms of SVT often.  If you do not have symptoms, you may not need treatment. Follow these instructions at home: Stress  Avoid stressful situations when possible.  Find healthy ways of managing stress that work for you. Some healthy ways  to manage stress include: ? Taking part in relaxing activities, such as yoga, meditation, or being out in nature. ? Listening to relaxing music. ? Practicing relaxation techniques, such as deep breathing. ? Leading a healthy lifestyle. This involves getting plenty of sleep, exercising, and eating a balanced diet. ? Attending counseling or talk therapy with a mental health professional. Sleep  Try to get at least 7 hours of sleep each night. Tobacco and nicotine  Do not use any products that contain nicotine or tobacco, such as cigarettes and e-cigarettes. If you need help quitting, ask your health care provider. Alcohol  If alcohol triggers episodes of SVT, do not drink alcohol.  If alcohol does not seem to trigger episodes, limit alcohol intake to no more than 1 drink a day for nonpregnant women and 2 drinks a day for men. One drink equals 12 oz of beer, 5 oz of wine, or 1 oz of hard liquor. Caffeine  If caffeine triggers episodes of SVT, do not eat, drink, or use anything with caffeine in it.  If caffeine does not seem to trigger episodes, consume caffeine in moderation. Stimulant drugs  Do not use stimulant drugs. If you need help quitting, talk with your health care provider. General instructions  Maintain a healthy weight.  Exercise regularly. Ask your health care provider to suggest some good activities for you. Aim for one or a combination of the following: ? 150 minutes per week of moderate exercise, such as walking or yoga. ? 75 minutes per week of vigorous exercise, such as running or swimming.  Perform vagus nerve stimulation as directed by your health care provider.  Take over-the-counter and prescription medicines only as told by your health care provider. Contact a health care provider if:  You have episodes of SVT more often than before.  Episodes of SVT last longer than before.  Vagus nerve stimulation is no longer helping.  You have new symptoms. Get  help right away if:  You have chest pain.  Your symptoms get worse.  You have trouble breathing.  You have an episode of SVT that lasts longer than 20 minutes.  You faint. These symptoms may represent a serious problem that is an emergency. Do not wait to see if the symptoms will go away. Get medical help right away. Call your local emergency services (911 in the [place] Do not drive yourself to the hospital. This information is not intended to replace advice given to you by your health care provider. Make sure you discuss any questions you have with your health care provider. Document Released: 09/20/2005 Document Revised: 05/27/2016 Document Reviewed: 05/27/2016 Elsevier Interactive Patient Education  2017 [employer].

## 2017-05-27 NOTE — Progress Notes (Addendum)
Progress Note  Patient Name: Corey [name] Date of Encounter: 05/27/2017  Primary Cardiologist: Nahser  Subjective   No chest pain. No shortness of breath. Awaiting discharge home. He has a friend tonight that is having a 50th wedding anniversary. He states that he has celebrated his 54rd.  Inpatient Medications    Scheduled Meds: . atorvastatin  40 mg Oral Daily  . fenofibrate  160 mg Oral Daily  . finasteride  5 mg Oral Daily  . lisinopril  10 mg Oral Daily  . metoprolol succinate  100 mg Oral Daily  . multivitamin with minerals  1 tablet Oral Daily  . pantoprazole  40 mg Oral Daily  . tamsulosin  0.4 mg Oral Daily   Continuous Infusions:  PRN Meds: acetaminophen, ondansetron (ZOFRAN) IV   Vital Signs    Vitals:   05/27/17 0430 05/27/17 0515 05/27/17 0711 05/27/17 0716  BP: 108/65 115/78  135/65  Pulse: (!) 59 63  65  Resp: 18 17  16   Temp:      TempSrc:      SpO2: 96% 94% 99% 99%  Weight:      Height:       No intake or output data in the 24 hours ending 05/27/17 0910 Filed Weights   05/26/17 2307  Weight: 266 lb (120.7 kg)    Telemetry    Sinus rhythm/sinus bradycardia - Personally Reviewed  ECG    Initial EKG demonstrates narrow complex tachycardia rate 141 bpm with no ischemic changes, possible 2to1 flutter.- Personally Reviewed  Physical Exam   GEN: No acute distress.   Neck: No JVD Cardiac: RRR, no murmurs, rubs, or gallops.  Respiratory: Clear to auscultation bilaterally. GI: Soft, nontender, non-distended  MS: No edema; No deformity. Neuro:  Nonfocal  Psych: Normal affect   Labs    Chemistry Recent Labs Lab 05/27/17 0059  NA 141  K 4.3  CL 108  CO2 24  GLUCOSE 127*  BUN 23*  CREATININE 1.15  CALCIUM 9.4  PROT 6.2*  ALBUMIN 3.6  AST 30  ALT 33  ALKPHOS 54  BILITOT 0.6  GFRNONAA >60  GFRAA >60  ANIONGAP 9     Hematology Recent Labs Lab 05/27/17 0059  WBC 5.9  RBC 4.25  HGB 12.8*  HCT 39.2  MCV 92.2  MCH 30.1    MCHC 32.7  RDW 13.9  PLT 197    Cardiac Enzymes Recent Labs Lab 05/27/17 0059 05/27/17 0500  TROPONINI 0.03* 0.04*   No results for input(s): TROPIPOC in the last 168 hours.   BNPNo results for input(s): BNP, PROBNP in the last 168 hours.   DDimer No results for input(s): DDIMER in the last 168 hours.   Radiology    Dg Chest 2 View  Result Date: 05/27/2017 CLINICAL DATA:  Non radiating chest pain. EXAM: CHEST  2 VIEW COMPARISON:  Chest radiographs 12/03/2014 FINDINGS: The cardiomediastinal contours are normal. Mild cardiomegaly and thoracic aortic atherosclerosis, stable. Pulmonary vasculature is normal. No consolidation, pleural effusion, or pneumothorax. No acute osseous abnormalities are seen. IMPRESSION: Stable mild cardiomegaly.  No acute abnormality. Electronically Signed   By: Jeb Levering M.D.   On: 05/27/2017 01:35    Cardiac Studies   Low-level troponin 0.03/0.04. Prior echocardiogram 2016 normal ejection fraction.  Patient Profile     71 y.o. male with supraventricular tachycardia, possible atrial flutter transiently with auto conversion  Assessment & Plan    SVT  - Spontaneous conversion.  - Continue with beta  blocker.  - Okay with discharge home. Xarelto  Demand ischemia  - Very low level flat troponin.  - No evidence of acute coronary syndrome. No chest pain.  - This low-level troponin increase was secondary to tachycardia transiently.  Morbid obesity  - Encourage weight loss. This is playing a role with his arrhythmia.  Okay for discharge. Discussed with patient. Have follow-up with cardiology in the next 2-4 weeks  Signed, Candee Furbish, MD  05/27/2017, 9:10 AM

## 2017-05-27 NOTE — ED Notes (Signed)
Patient transported to X-ray 

## 2017-06-21 ENCOUNTER — Ambulatory Visit (INDEPENDENT_AMBULATORY_CARE_PROVIDER_SITE_OTHER): Payer: Medicare Other | Admitting: Physician Assistant

## 2017-06-21 ENCOUNTER — Encounter: Payer: Self-pay | Admitting: Physician Assistant

## 2017-06-21 ENCOUNTER — Other Ambulatory Visit: Payer: Self-pay | Admitting: *Deleted

## 2017-06-21 VITALS — BP 120/60 | HR 60 | Ht 73.0 in | Wt 275.1 lb

## 2017-06-21 DIAGNOSIS — I471 Supraventricular tachycardia, unspecified: Secondary | ICD-10-CM

## 2017-06-21 DIAGNOSIS — I2510 Atherosclerotic heart disease of native coronary artery without angina pectoris: Secondary | ICD-10-CM | POA: Diagnosis not present

## 2017-06-21 DIAGNOSIS — I779 Disorder of arteries and arterioles, unspecified: Secondary | ICD-10-CM | POA: Diagnosis not present

## 2017-06-21 DIAGNOSIS — I10 Essential (primary) hypertension: Secondary | ICD-10-CM | POA: Diagnosis not present

## 2017-06-21 DIAGNOSIS — I480 Paroxysmal atrial fibrillation: Secondary | ICD-10-CM

## 2017-06-21 DIAGNOSIS — I739 Peripheral vascular disease, unspecified: Secondary | ICD-10-CM

## 2017-06-21 MED ORDER — METOPROLOL SUCCINATE ER 50 MG PO TB24: 100.0000 mg | ORAL_TABLET | Freq: Every day | ORAL | 3 refills | Status: DC

## 2017-06-21 MED ORDER — OMEPRAZOLE 10 MG PO CPDR: 10.0000 mg | DELAYED_RELEASE_CAPSULE | Freq: Every day | ORAL | 3 refills | Status: DC

## 2017-06-21 MED ORDER — FENOFIBRATE 160 MG PO TABS: ORAL_TABLET | ORAL | 3 refills | Status: DC

## 2017-06-21 NOTE — Progress Notes (Signed)
Cardiology Office Note:    Date:  06/21/2017   ID:  Corey [name], DOB Aug 15, 1946, MRN [id number]  PCP:  Corey Amel, MD  Cardiologist:  Dr. Liam Rogers    Referring MD: Corey Amel, MD   Chief Complaint  Patient presents with  . Hospitalization Follow-up    admx with SVT    History of Present Illness:    Corey [name] is a 71 y.o. male with a hx of CAD s/p PCI with Taxus DES to RCA in 2004 and inferior STEMI 2/2 stent thrombosis in 12/2005 tx with BMS to the RCA, carotid artery disease, PAF s/p DCCV for AF with RVR in 2016, prior CVA, HTN, HL, OSA.  He is on Rivaroxaban for anticoagulation. Last seen in 6/18.    Admitted 8/23-8/24 with chest pain and tachycardia.  He was noted to be in SVT with HR 140s upon admission.  His Troponin levels remained minimally elevated felt to be due to demand ischemia.  He converted from SVT to NSR spontaneously.  He was continued on beta-blocker Rx.    Corey [name] returns for post hospitalization follow up.  He is here alone.  He denies any further palpitations.  He denies recurrent chest pain.  He denies having any significant shortness of breath.  He also denies paroxysmal nocturnal dyspnea, edema, syncope.    Prior CV studies:   The following studies were reviewed today:  Carotid US 08/2016 R 1-39; L 40-59 >> repeat 1 year  Echo 12/2014 Mod LVH, EF 60  Myoview 04/2014 Low Risk; inferoapical defect likely due to diaphragmatic attenuation, EF 66  Event monitor 07/2012 NSR, occ PVCs  Cardiac Catheterization 12/2005 EF 17 Left main mid 10-20 LAD mid 30-40 LCx okay RCA proximal 20, mid stent 50-60 with in-stent thrombosis PCI: 3.5 x 24 mm Liberte BMS to the RCA  Past Medical History:  Diagnosis Date  . Arthritis    "back, right knee, hands, ankles, neck" (05/31/2016)  . Atrial fibrillation with RVR ([place])   . Chronic lower back pain   . Coronary atherosclerosis of native coronary artery    a. BMS to [facility] 2004 and 2007, otherwise  mild nonobstructive disease. EF normal.  . CVA (cerebral vascular accident) ([place]) 07/2012   AF discovered after stroke; denies residual on 12/04/2014  . Diverticulitis   . Dyslipidemia   . Essential hypertension, benign   . GERD (gastroesophageal reflux disease)   . Lumbar radiculopathy, chronic 02/04/2015   Right L5  . Obesity   . OSA on CPAP   . Paroxysmal atrial fibrillation ([place])    a. Discovered after stroke.  . Pneumonia 01/1996  . PONV (postoperative nausea and vomiting)   . Recurrent upper respiratory infection (URI)     Past Surgical History:  Procedure Laterality Date  . ANTERIOR CERVICAL DECOMP/DISCECTOMY FUSION  07/2001; 10/2002   "C5-6; C6-7; redo"  . BACK SURGERY    . CARPAL TUNNEL RELEASE Left 10/2015  . COLONOSCOPY W/ POLYPECTOMY  02/2014  . CORONARY ANGIOPLASTY WITH STENT PLACEMENT  05/2003; 12/2005   "mid RCA; mid RCA"  . JOINT REPLACEMENT    . KNEE ARTHROSCOPY Left 10/2005  . KNEE ARTHROSCOPY W/ PARTIAL MEDIAL MENISCECTOMY Left 09/2005  . LUMBAR LAMINECTOMY/DECOMPRESSION MICRODISCECTOMY  03/2005   "L4-5"  . POSTERIOR LUMBAR FUSION  10/2003   L5-S1; "plates, screws"  . SHOULDER ARTHROSCOPY Right 08/2011   Debridement of labrum, arthroscopic distal clavicle excision  . SHOULDER OPEN ROTATOR CUFF REPAIR Left 07/2014  .  TEE WITHOUT CARDIOVERSION  07/07/2012   Procedure: TRANSESOPHAGEAL ECHOCARDIOGRAM (TEE);  Surgeon: Fay Records, MD;  Location: [facility];  Service: Cardiovascular;  Laterality: N/A;  . TONSILLECTOMY AND ADENOIDECTOMY  ~ 1956  . TOTAL KNEE ARTHROPLASTY Left 10/2006  . TRIGGER FINGER RELEASE Left 10/2015    Current Medications: Current Meds  Medication Sig  . acetaminophen (TYLENOL 8 HOUR ARTHRITIS PAIN) 650 MG CR tablet Take 1,300 mg by mouth 2 (two) times daily as needed for pain (pain).  Marland Kitchen atorvastatin (LIPITOR) 40 MG tablet TAKE 1 TABLET BY MOUTH EVERY DAY  . finasteride (PROSCAR) 5 MG tablet Take 5 mg by mouth daily.  Marland Kitchen lisinopril  (PRINIVIL,ZESTRIL) 10 MG tablet Take 10 mg by mouth daily.  . metoprolol succinate (TOPROL-XL) 50 MG 24 hr tablet Take 2 tablets (100 mg total) by mouth daily.  . Multiple Vitamins-Minerals (MULTIVITAMINS THER. W/MINERALS) TABS Take 1 tablet by mouth daily.    . nitroGLYCERIN (NITROSTAT) 0.4 MG SL tablet Place 1 tablet (0.4 mg total) under the tongue every 5 (five) minutes as needed for chest pain.  . Probiotic Product (PROBIOTIC PO) Take 1 capsule by mouth 2 (two) times daily.  . Tamsulosin HCl (FLOMAX) 0.4 MG CAPS Take 0.4 mg by mouth daily.    . traMADol-acetaminophen (ULTRACET) 37.5-325 MG tablet Take 1 tablet by mouth every 6 (six) hours as needed for moderate pain.  Marland Kitchen XARELTO 20 MG TABS tablet TAKE 1 TABLET BY MOUTH EVERY DAY     Allergies:   Codeine; Monopril [fosinopril sodium]; Ampicillin; and Percocet [oxycodone-acetaminophen]   Social History   Social History  . Marital status: Married    Spouse name: [name]  . Number of children: 3  . Years of education: 14   Occupational History  . Retired Disabled     Works at [employer] part time   Social History Main Topics  . Smoking status: Former Smoker    Packs/day: 1.00    Years: 34.00    Types: Cigarettes    Quit date: 05/05/2003  . Smokeless tobacco: Never Used  . Alcohol use No  . Drug use: No  . Sexual activity: Yes   Other Topics Concern  . None   Social History Narrative   Patient lives at home with spouse.   Caffeine Use:      Family Hx: The patient's family history includes Heart attack in his father; Heart disease in his father; Hypertension in his mother.  ROS:   Please see the history of present illness.    Review of Systems  Cardiovascular: Positive for irregular heartbeat.  Musculoskeletal: Positive for back pain.   All other systems reviewed and are negative.   EKGs/Labs/Other Test Reviewed:    EKG:  EKG is  ordered today.  The ekg ordered today demonstrates Sinus brady, HR 59, QTc 443 ms, no  significant changes.   Recent Labs: 05/27/2017: ALT 33; BUN 23; Creatinine, Ser 1.15; Hemoglobin 12.8; Platelets 197; Potassium 4.3; Sodium 141   Recent Lipid Panel Lab Results  Component Value Date/Time   CHOL 116 08/30/2016 08:40 AM   CHOL 108 01/05/2013 07:48 AM   TRIG 105 08/30/2016 08:40 AM   TRIG 84 01/05/2013 07:48 AM   HDL 31 (L) 08/30/2016 08:40 AM   HDL 29 (L) 01/05/2013 07:48 AM   CHOLHDL 3.7 08/30/2016 08:40 AM   LDLCALC 64 08/30/2016 08:40 AM   LDLCALC 62 01/05/2013 07:48 AM    Physical Exam:    VS:  BP 120/60  Pulse 60   Ht 6\' 1"  (1.854 m)   Wt 275 lb 1.9 oz (124.8 kg)   BMI 36.30 kg/m     Wt Readings from Last 3 Encounters:  06/21/17 275 lb 1.9 oz (124.8 kg)  05/26/17 266 lb (120.7 kg)  03/14/17 276 lb (125.2 kg)     Physical Exam  Constitutional: He is oriented to person, place, and time. He appears well-developed and well-nourished. No distress.  HENT:  Head: Normocephalic and atraumatic.  Eyes: No scleral icterus.  Neck: No JVD present.  Cardiovascular: Normal rate, regular rhythm and normal heart sounds.   No murmur heard. Pulmonary/Chest: Effort normal. He has no wheezes. He has no rales.  Abdominal: Soft. There is no tenderness.  Musculoskeletal: He exhibits no edema.  Neurological: He is alert and oriented to person, place, and time.  Skin: Skin is warm and dry.  Psychiatric: He has a normal mood and affect.    ASSESSMENT:    1. PSVT (paroxysmal supraventricular tachycardia) ([place])   2. Coronary artery disease involving native coronary artery of native heart without angina pectoris   3. PAF (paroxysmal atrial fibrillation) ([place])   4. Bilateral carotid artery disease ([place])   5. Essential hypertension    PLAN:    In order of problems listed above:  1. PSVT (paroxysmal supraventricular tachycardia) ([facility]) No apparent recurrence.  His HR and BP are too low to tolerate an increase in beta-blocker.  Plus, he had significant fatigue on  higher dose Metoprolol Succinate in the past.  Continue current Rx.  Refer to EP if SVT recurs in the near future.  We discussed vagal maneuvers.  2. Coronary artery disease involving native coronary artery of native heart without angina pectoris  S/p Taxus DES to the RCA in 2004 and inferior STEMI 2/2 stent thrombosis in 2007 tx with a BMS.  Myoview in 2015 was low risk. He had minimally elevated troponin levels. However, this was secondary to demand ischemia in the setting of SVT. He denies further chest pain. Continue statin, ACE inhibitor, beta blocker.   3. PAF (paroxysmal atrial fibrillation) ([facility]) Maintaining normal sinus rhythm. Continue rivaroxaban for anticoagulation.  4. Bilateral carotid artery disease ([place]) Carotid US is planned for November.  5. Essential hypertension The patient's blood pressure is controlled on his current regimen.  Continue current therapy.     Dispo:  Return in about 3 months (around 09/20/2017) for Routine Follow Up, w/ Dr. Acie Fredrickson.   Medication Adjustments/Labs and Tests Ordered: Current medicines are reviewed at length with the patient today.  Concerns regarding medicines are outlined above.  Tests Ordered: Orders Placed This Encounter  Procedures  . EKG 12-Lead   Medication Changes: Meds ordered this encounter  Medications  . metoprolol succinate (TOPROL-XL) 50 MG 24 hr tablet    Sig: Take 2 tablets (100 mg total) by mouth daily.    Dispense:  90 tablet    Refill:  3  . fenofibrate 160 MG tablet    Sig: TAKE 1 TABLET (160 MG TOTAL) BY MOUTH DAILY.    Dispense:  90 tablet    Refill:  3  . omeprazole (PRILOSEC) 10 MG capsule    Sig: Take 1 capsule (10 mg total) by mouth daily.    Dispense:  90 capsule    Refill:  3    Signed, Richardson Dopp, PA-C  06/21/2017 11:45 AM    [address], [place], [place]  [address] Phone: [phone]; Fax: [phone]    

## 2017-06-21 NOTE — Patient Instructions (Addendum)
Medication Instructions:  Your physician recommends that you continue on your current medications as directed. Please refer to the Current Medication list given to you today.  REFILLS HAVE BEEN SENT IN FOR TOPROL, FENOFIBRATE AND PRILOSEC  Labwork: NONE ORDERED TODAY  Testing/Procedures: NONE ORDERED TODAY  Follow-Up: 09/20/17 @ 8:20 WITH DR. Acie Fredrickson   Any Other Special Instructions Will Be Listed Below (If Applicable).     If you need a refill on your cardiac medications before your next appointment, please call your pharmacy.

## 2017-07-13 ENCOUNTER — Encounter: Payer: Self-pay | Admitting: Cardiovascular Disease

## 2017-07-14 ENCOUNTER — Telehealth: Payer: Self-pay | Admitting: *Deleted

## 2017-07-14 ENCOUNTER — Encounter: Payer: Self-pay | Admitting: Cardiovascular Disease

## 2017-07-14 NOTE — Telephone Encounter (Signed)
Mr. Hinchman,  I will start working on prior authorization for sildenifil with [facility]. We still need to get an order from Dr Acie Fredrickson to the pharmacy. I will contact Dr Acie Fredrickson and Christen Bame, RN about the order.   Thanks, Lovett Sox, RN

## 2017-07-14 NOTE — Telephone Encounter (Signed)
See prior emails for further information. I have completed a prior authorization for [facility] and will send to [employer]. We still need an order from Dr Acie Fredrickson for medication, I have routed a note to him and Christen Bame, RN

## 2017-07-15 ENCOUNTER — Telehealth: Payer: Self-pay

## 2017-07-15 NOTE — Telephone Encounter (Signed)
Notice of denial of coverage for Sildenafil 20mg  received. Patient notified of this. He states he will still pick up the Rx here and probably just do cash pay.

## 2017-07-18 ENCOUNTER — Other Ambulatory Visit: Payer: Self-pay | Admitting: Nurse Practitioner

## 2017-07-18 MED ORDER — SILDENAFIL CITRATE 20 MG PO TABS: ORAL_TABLET | ORAL | 2 refills | Status: DC

## 2017-07-19 ENCOUNTER — Telehealth: Payer: Self-pay | Admitting: Nurse Practitioner

## 2017-07-19 NOTE — Telephone Encounter (Signed)
Spoke with patient regarding safety concerns with sildenafil and NTG from pharmacist at [facility] on [address]. Patient states he has conversed with Dr. Acie Fredrickson by email regarding this prior to requesting the Rx. He states he has done a lot of reading regarding the concerns of taking this medication in combination with NTG and he does not plan to take the medications even within the same day. He states he carries the NTG but has never had to take one. He plans to take sildenafi 20 mg to see if it is effective and does not plan to take more than 3 tablets at any time.  He states he will call us if he experiences any problems. He thanked me for the call.

## 2017-07-20 ENCOUNTER — Other Ambulatory Visit: Payer: Self-pay | Admitting: Cardiovascular Disease

## 2017-07-20 ENCOUNTER — Other Ambulatory Visit: Payer: Self-pay

## 2017-07-20 ENCOUNTER — Encounter: Payer: Self-pay | Admitting: Cardiovascular Disease

## 2017-07-20 NOTE — Telephone Encounter (Signed)
Medication Detail    Disp Refills Start End   fenofibrate 160 MG tablet 90 tablet 3 06/21/2017    Sig: TAKE 1 TABLET (160 MG TOTAL) BY MOUTH DAILY.   Sent to pharmacy as: fenofibrate 160 MG tablet   E-Prescribing Status: Receipt confirmed by pharmacy (06/21/2017 11:38 AM EDT)   Pharmacy   [id number] - [place], [place] [address]

## 2017-07-25 ENCOUNTER — Other Ambulatory Visit: Payer: Self-pay | Admitting: *Deleted

## 2017-07-25 MED ORDER — RIVAROXABAN 20 MG PO TABS: 20.0000 mg | ORAL_TABLET | Freq: Every day | ORAL | 9 refills | Status: DC

## 2017-07-25 NOTE — Telephone Encounter (Signed)
Pharmacy requests a 77 day rx.

## 2017-07-25 NOTE — Telephone Encounter (Signed)
Pt last saw Richardson Dopp, PA on 06/21/17, last labs 05/27/17 Creat 1.15, age 71, weight 124.8kg, CrCl 105.51.  Based on CrCl pt is on appropriate dosage of Xarelto 20mg  QD.  Will refill rx.

## 2017-08-16 ENCOUNTER — Encounter (HOSPITAL_COMMUNITY): Payer: Medicare Other

## 2017-08-16 ENCOUNTER — Ambulatory Visit (HOSPITAL_COMMUNITY)
Admission: RE | Admit: 2017-08-16 | Discharge: 2017-08-16 | Disposition: A | Discharge status: Home / Self Care | Payer: Medicare Other | Source: Ambulatory Visit | Attending: Cardiology | Admitting: Cardiology

## 2017-08-16 DIAGNOSIS — I10 Essential (primary) hypertension: Secondary | ICD-10-CM | POA: Insufficient documentation

## 2017-08-16 DIAGNOSIS — Z87891 Personal history of nicotine dependence: Secondary | ICD-10-CM | POA: Diagnosis not present

## 2017-08-16 DIAGNOSIS — I779 Disorder of arteries and arterioles, unspecified: Secondary | ICD-10-CM

## 2017-08-16 DIAGNOSIS — E785 Hyperlipidemia, unspecified: Secondary | ICD-10-CM | POA: Diagnosis not present

## 2017-08-16 DIAGNOSIS — Z8673 Personal history of transient ischemic attack (TIA), and cerebral infarction without residual deficits: Secondary | ICD-10-CM | POA: Diagnosis not present

## 2017-08-16 DIAGNOSIS — I6523 Occlusion and stenosis of bilateral carotid arteries: Secondary | ICD-10-CM | POA: Insufficient documentation

## 2017-08-16 DIAGNOSIS — I739 Peripheral vascular disease, unspecified: Secondary | ICD-10-CM

## 2017-08-17 ENCOUNTER — Telehealth: Payer: Self-pay | Admitting: *Deleted

## 2017-08-17 DIAGNOSIS — I6523 Occlusion and stenosis of bilateral carotid arteries: Secondary | ICD-10-CM

## 2017-08-17 NOTE — Telephone Encounter (Signed)
-----   Message from Burtis Junes, NP sent at 08/17/2017  7:44 AM EST ----- Reviewed for Richardson Dopp, PA Ok to report.  Carotid doppler shows 1 to 39% on the right and 40 to 59% on the left Repeat study in one year.

## 2017-08-17 NOTE — Telephone Encounter (Signed)
Lmtcb to go over carotid results.

## 2017-08-18 NOTE — Telephone Encounter (Signed)
Lmtcb x 2 to go over carotid results

## 2017-08-18 NOTE — Telephone Encounter (Signed)
-----   Message from Burtis Junes, NP sent at 08/17/2017  7:44 AM EST ----- Reviewed for Richardson Dopp, PA Ok to report.  Carotid doppler shows 1 to 39% on the right and 40 to 59% on the left Repeat study in one year.

## 2017-08-19 NOTE — Telephone Encounter (Signed)
Pt has been notified of carotid results by phone with verbal understanding. Pt agreeable to repeat carotid in 1 yr. Pt thanked me for my call today.

## 2017-08-19 NOTE — Telephone Encounter (Signed)
-----   Message from Burtis Junes, NP sent at 08/17/2017  7:44 AM EST ----- Reviewed for Richardson Dopp, PA Ok to report.  Carotid doppler shows 1 to 39% on the right and 40 to 59% on the left Repeat study in one year.

## 2017-08-23 ENCOUNTER — Ambulatory Visit (INDEPENDENT_AMBULATORY_CARE_PROVIDER_SITE_OTHER): Payer: Medicare Other | Admitting: Specialist

## 2017-08-23 ENCOUNTER — Encounter (INDEPENDENT_AMBULATORY_CARE_PROVIDER_SITE_OTHER): Payer: Self-pay | Admitting: Specialist

## 2017-08-23 ENCOUNTER — Ambulatory Visit (INDEPENDENT_AMBULATORY_CARE_PROVIDER_SITE_OTHER): Payer: Self-pay

## 2017-08-23 VITALS — BP 164/78 | HR 64 | Ht 73.5 in | Wt 271.0 lb

## 2017-08-23 DIAGNOSIS — M4815 Ankylosing hyperostosis [Forestier], thoracolumbar region: Secondary | ICD-10-CM

## 2017-08-23 DIAGNOSIS — M5441 Lumbago with sciatica, right side: Secondary | ICD-10-CM | POA: Diagnosis not present

## 2017-08-23 DIAGNOSIS — M48062 Spinal stenosis, lumbar region with neurogenic claudication: Secondary | ICD-10-CM

## 2017-08-23 DIAGNOSIS — M51369 Other intervertebral disc degeneration, lumbar region without mention of lumbar back pain or lower extremity pain: Secondary | ICD-10-CM

## 2017-08-23 DIAGNOSIS — G8929 Other chronic pain: Secondary | ICD-10-CM | POA: Diagnosis not present

## 2017-08-23 DIAGNOSIS — M4317 Spondylolisthesis, lumbosacral region: Secondary | ICD-10-CM | POA: Diagnosis not present

## 2017-08-23 DIAGNOSIS — M4807 Spinal stenosis, lumbosacral region: Secondary | ICD-10-CM | POA: Diagnosis not present

## 2017-08-23 DIAGNOSIS — M5136 Other intervertebral disc degeneration, lumbar region: Secondary | ICD-10-CM

## 2017-08-23 MED ORDER — GABAPENTIN 300 MG PO CAPS: 300.0000 mg | ORAL_CAPSULE | Freq: Every day | ORAL | 2 refills | Status: DC

## 2017-08-23 MED ORDER — METHYLPREDNISOLONE 4 MG PO TBPK: ORAL_TABLET | ORAL | 0 refills | Status: DC

## 2017-08-23 MED ORDER — GABAPENTIN 300 MG PO CAPS: 300.0000 mg | ORAL_CAPSULE | Freq: Three times a day (TID) | ORAL | 2 refills | Status: DC

## 2017-08-23 NOTE — Progress Notes (Addendum)
Office Visit Note   Patient: Corey [name]           Date of Birth: [birth year]           MRN: [id number] Visit Date: 08/23/2017              Requested by: [name], MD [place], [place] [address] PCP: [name], MD   Assessment & Plan: Visit Diagnoses:  1. Chronic right-sided low back pain with right-sided sciatica   2. Spinal stenosis of lumbar region with neurogenic claudication   3. 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 Other intervertebral disc degeneration, lumbar region   5. Spinal stenosis of lumbosacral region   6. Spondylolisthesis of lumbosacral region     Plan:Avoid bending, stooping and avoid lifting weights greater than 10 lbs. Avoid prolong standing and walking. Avoid frequent bending and stooping  No lifting greater than 10 lbs. May use ice or moist heat for pain. Weight loss is of benefit. Handicap license is approved. Mri with and without enhancement to evaluate for right sided L3-4 or L4-5 stenosis.  Follow-Up Instructions: Return in about 3 weeks (around 09/13/2017).   Orders:  Orders Placed This Encounter  Procedures  . XR Lumbar Spine 2-3 Views  . MR Lumbar Spine W Wo Contrast   Meds ordered this encounter  Medications  . DISCONTD: gabapentin (NEURONTIN) 300 MG capsule    Sig: Take 1 capsule (300 mg total) by mouth 3 (three) times daily.    Dispense:  30 capsule    Refill:  2  . DISCONTD: gabapentin (NEURONTIN) 300 MG capsule    Sig: Take 1 capsule (300 mg total) by mouth at bedtime.    Dispense:  30 capsule    Refill:  2  . DISCONTD: methylPREDNISolone (MEDROL DOSEPAK) 4 MG TBPK tablet    Sig: Take as directed.    Dispense:  21 tablet    Refill:  0      Procedures: No procedures performed   Clinical Data: No additional findings.   Subjective: Chief Complaint  Patient presents with  . Lower Back - Pain    71 year old male with history of progressive increasing  right buttock pain and radiation into the right buttock, right posterior and lateral thigh and posterolateral right calf into the right great toe With feeling of hot poker pain. Walking in decreasing to where he is no longer going to the gym to exercise. No bowel or bladder difficulty. Numbness in the right foot whole. Tingling into the right foot. Can not walk well enough to shop, can last 1 hour and then has to sit to decrease the pain. Sleeping with a sleep aid, gel cap, not melatonin. Previous L5-S1 fusion, previous bilateral hip Evaluation by Dr. Alvan Dame and told it was not the cause of his pain. He is weak in lifting the right hip to get in and out of his car. Previous left TKR is doing well, has had right basal thumb suspension arthroplasty.     Review of Systems  Constitutional: Negative.   HENT: Positive for congestion, rhinorrhea, sinus pressure, sinus pain and sneezing.   Eyes: Negative.  Negative for photophobia, pain, discharge, redness, itching and visual disturbance.  Respiratory: Positive for apnea. Negative for choking, chest tightness, shortness of breath, wheezing and stridor.   Cardiovascular: Negative.  Negative for chest pain, palpitations and leg swelling.  Gastrointestinal: Negative.  Negative for abdominal distention, abdominal pain, anal bleeding, blood  in stool and constipation.  Endocrine: Negative.  Negative for cold intolerance, heat intolerance, polydipsia, polyphagia and polyuria.  Genitourinary: Negative.  Negative for difficulty urinating, discharge, dysuria, enuresis, flank pain, frequency, genital sores, hematuria and penile swelling.  Musculoskeletal: Positive for back pain. Negative for arthralgias, gait problem, joint swelling, myalgias, neck pain and neck stiffness.  Skin: Negative.  Negative for color change, pallor, rash and wound.  Allergic/Immunologic: Negative.  Negative for environmental allergies, food allergies and immunocompromised state.  Neurological:  Negative.  Negative for dizziness, tremors, seizures, syncope, facial asymmetry, speech difficulty, weakness, light-headedness, numbness and headaches.  Hematological: Negative.  Negative for adenopathy. Does not bruise/bleed easily.  Psychiatric/Behavioral: Negative.  Negative for agitation, behavioral problems, confusion, decreased concentration, dysphoric mood, hallucinations, self-injury, sleep disturbance and suicidal ideas. The patient is not nervous/anxious and is not hyperactive.      Objective: Vital Signs: BP (!) 164/78 (BP Location: Left Arm, Patient Position: Sitting)   Pulse 64   Ht 6' 1.5" (1.867 m)   Wt 271 lb (122.9 kg)   BMI 35.27 kg/m   Physical Exam  Constitutional: He is oriented to person, place, and time. He appears well-developed and well-nourished.  HENT:  Head: Normocephalic and atraumatic.  Eyes: EOM are normal. Pupils are equal, round, and reactive to light.  Neck: Normal range of motion. Neck supple.  Pulmonary/Chest: Effort normal and breath sounds normal.  Abdominal: Soft. Bowel sounds are normal.  Neurological: He is alert and oriented to person, place, and time.  Skin: Skin is warm and dry.  Psychiatric: He has a normal mood and affect. His behavior is normal. Judgment and thought content normal.    Back Exam   Tenderness  The patient is experiencing tenderness in the lumbar.  Range of Motion  Extension:  20 abnormal  Flexion:  70 abnormal  Lateral bend right: abnormal  Lateral bend left: abnormal  Rotation right: abnormal  Rotation left: abnormal   Muscle Strength  The patient has normal back strength. Right Quadriceps:  5/5  Left Quadriceps:  5/5  Right Hamstrings:  5/5  Left Hamstrings:  5/5   Tests  Straight leg raise right: negative Straight leg raise left: negative  Reflexes  Patellar:  Hyporeflexic abnormal Achilles:  Hyporeflexic abnormal Biceps: normal Babinski's sign: normal   Other  Toe walk: normal Heel walk:  normal Gait: normal       Specialty Comments:  No specialty comments available.  Imaging: No results found.   PMFS History: Patient Active Problem List   Diagnosis Date Noted  . Chest pain 05/27/2017  . Demand ischemia ([place]) 05/27/2017  . Morbid obesity ([place]) 05/27/2017  . PAF (paroxysmal atrial fibrillation) ([place]) 08/30/2016  . Carotid artery disease ([place]) 08/30/2016  . Essential hypertension 08/30/2016  . Diverticulitis of intestine without perforation or abscess without bleeding   . Hyponatremia 06/04/2016  . Diverticulitis 05/31/2016  . Iron deficiency 01/05/2016  . Lumbar radiculopathy, chronic 02/04/2015  . SVT (supraventricular tachycardia) ([place]) 12/03/2014  . PAC (premature atrial contraction) 04/05/2014  . OSA (obstructive sleep apnea) 03/27/2014  . CVA (cerebral infarction) 07/06/2012  . Coronary artery disease   . Dyslipidemia   . GERD (gastroesophageal reflux disease)    Past Medical History:  Diagnosis Date  . Arthritis    "back, right knee, hands, ankles, neck" (05/31/2016)  . Atrial fibrillation with RVR ([place])   . Chronic lower back pain   . Coronary atherosclerosis of native coronary artery    a. BMS to [facility] 2004  and 2007, otherwise mild nonobstructive disease. EF normal.  . CVA (cerebral vascular accident) ([place]) 07/2012   AF discovered after stroke; denies residual on 12/04/2014  . Diverticulitis   . Dyslipidemia   . Essential hypertension, benign   . GERD (gastroesophageal reflux disease)   . Lumbar radiculopathy, chronic 02/04/2015   Right L5  . Obesity   . OSA on CPAP   . Paroxysmal atrial fibrillation ([place])    a. Discovered after stroke.  . Pneumonia 01/1996  . PONV (postoperative nausea and vomiting)   . Recurrent upper respiratory infection (URI)     Family History  Problem Relation Age of Onset  . Hypertension Mother   . Heart disease Father   . Heart attack Father     Past Surgical History:  Procedure Laterality Date  . ANTERIOR  CERVICAL DECOMP/DISCECTOMY FUSION  07/2001; 10/2002   "C5-6; C6-7; redo"  . BACK SURGERY    . CARPAL TUNNEL RELEASE Left 10/2015  . COLONOSCOPY W/ POLYPECTOMY  02/2014  . CORONARY ANGIOPLASTY WITH STENT PLACEMENT  05/2003; 12/2005   "mid RCA; mid RCA"  . JOINT REPLACEMENT    . KNEE ARTHROSCOPY Left 10/2005  . KNEE ARTHROSCOPY W/ PARTIAL MEDIAL MENISCECTOMY Left 09/2005  . LUMBAR LAMINECTOMY/DECOMPRESSION MICRODISCECTOMY  03/2005   "L4-5"  . POSTERIOR LUMBAR FUSION  10/2003   L5-S1; "plates, screws"  . SHOULDER ARTHROSCOPY Right 08/2011   Debridement of labrum, arthroscopic distal clavicle excision  . SHOULDER OPEN ROTATOR CUFF REPAIR Left 07/2014  . TEE WITHOUT CARDIOVERSION  07/07/2012   Procedure: TRANSESOPHAGEAL ECHOCARDIOGRAM (TEE);  Surgeon: Fay Records, MD;  Location: [facility];  Service: Cardiovascular;  Laterality: N/A;  . TONSILLECTOMY AND ADENOIDECTOMY  ~ 1956  . TOTAL KNEE ARTHROPLASTY Left 10/2006  . TRIGGER FINGER RELEASE Left 10/2015   Social History   Occupational History  . Occupation: Retired    Fish farm manager: DISABLED     Comment: Works at [employer] part time  Tobacco Use  . Smoking status: Former Smoker    Packs/day: 1.00    Years: 34.00    Pack years: 34.00    Types: Cigarettes    Last attempt to quit: 05/05/2003    Years since quitting: 15.1  . Smokeless tobacco: Never Used  Substance and Sexual Activity  . Alcohol use: No    Alcohol/week: 0.0 standard drinks  . Drug use: No  . Sexual activity: Yes

## 2017-08-23 NOTE — Patient Instructions (Addendum)
Avoid bending, stooping and avoid lifting weights greater than 10 lbs. Avoid prolong standing and walking. Avoid frequent bending and stooping  No lifting greater than 10 lbs. May use ice or moist heat for pain. Weight loss is of benefit. Handicap license is approved. Mri with and without enhancement to evaluate for right sided L3-4 or L4-5 stenosis

## 2017-08-30 ENCOUNTER — Telehealth (INDEPENDENT_AMBULATORY_CARE_PROVIDER_SITE_OTHER): Payer: Self-pay | Admitting: Specialist

## 2017-08-30 NOTE — Telephone Encounter (Signed)
Patient is having MRI on 12/7. I have him scheduled for an MRI review 12/31(first available). Could you place patient on a wait list and give him a call if anything opens sooner? Thanks!  CB# [phone]

## 2017-08-31 NOTE — Telephone Encounter (Signed)
Put on cancellation list 

## 2017-09-07 ENCOUNTER — Telehealth (INDEPENDENT_AMBULATORY_CARE_PROVIDER_SITE_OTHER): Payer: Self-pay | Admitting: Specialist

## 2017-09-07 NOTE — Telephone Encounter (Signed)
Hey looks like in the last OV note patients MRI is to be with and without contrast.  Can you please check on this? Referral says without.

## 2017-09-07 NOTE — Telephone Encounter (Signed)
Patient thought Dr. Louanne Skye wanted an MRI with and without contrast. They told him it was with out contrast and he just wants to make sure he is getting what Dr. Louanne Skye ordered. Please advise [phone]

## 2017-09-09 ENCOUNTER — Ambulatory Visit
Admission: RE | Admit: 2017-09-09 | Discharge: 2017-09-09 | Disposition: A | Discharge status: Home / Self Care | Payer: Medicare Other | Source: Ambulatory Visit | Attending: Specialist | Admitting: Specialist

## 2017-09-09 DIAGNOSIS — M5136 Other intervertebral disc degeneration, lumbar region: Secondary | ICD-10-CM

## 2017-09-09 DIAGNOSIS — M4807 Spinal stenosis, lumbosacral region: Secondary | ICD-10-CM

## 2017-09-09 DIAGNOSIS — M4317 Spondylolisthesis, lumbosacral region: Secondary | ICD-10-CM

## 2017-09-09 MED ORDER — GADOBENATE DIMEGLUMINE 529 MG/ML IV SOLN
20.0000 mL | Freq: Once | INTRAVENOUS | Status: AC | PRN
  Administered 2017-09-09: 20 mL via INTRAVENOUS

## 2017-09-09 NOTE — Telephone Encounter (Signed)
Order changed to with and without

## 2017-09-19 ENCOUNTER — Encounter (INDEPENDENT_AMBULATORY_CARE_PROVIDER_SITE_OTHER): Payer: Self-pay | Admitting: Specialist

## 2017-09-19 ENCOUNTER — Ambulatory Visit (INDEPENDENT_AMBULATORY_CARE_PROVIDER_SITE_OTHER): Payer: Medicare Other | Admitting: Specialist

## 2017-09-19 ENCOUNTER — Telehealth: Payer: Self-pay | Admitting: Neurology

## 2017-09-19 VITALS — BP 129/62 | HR 66 | Ht 73.5 in | Wt 271.0 lb

## 2017-09-19 DIAGNOSIS — M47816 Spondylosis without myelopathy or radiculopathy, lumbar region: Secondary | ICD-10-CM | POA: Diagnosis not present

## 2017-09-19 MED ORDER — DICLOFENAC-MISOPROSTOL 75-0.2 MG PO TBEC: 1.0000 | DELAYED_RELEASE_TABLET | Freq: Two times a day (BID) | ORAL | 3 refills | Status: DC

## 2017-09-19 NOTE — Progress Notes (Addendum)
Office Visit Note   Patient: Corey [name]           Date of Birth: 01-Dec-1945           MRN: [id number] Visit Date: 09/19/2017              Requested by: [name], MD [place], [place] [address] PCP: [name], MD   Assessment & Plan: Visit Diagnoses:  1. Spondylosis without myelopathy or radiculopathy, lumbar region    MRI with worsening spondylosis changes L1-2 noncompressive and no change in mild foramenal stenosis bilateral L3-4 and L4-5 above solid fusion L5-S1. His pain is predominantly right Buttock and right posterior thigh pain. With no change in study since 2001 at L3-4 and L4-5 I believe most of his pain is facet mediated and recommend facet blocks and evaluation for RFA of the medial branches first right L3-4 and L4-5.   Plan: Avoid bending, stooping and avoid lifting weights greater than 10 lbs. Avoid prolong standing and walking. Avoid frequent bending and stooping  No lifting greater than 10 lbs. May use ice or moist heat for pain. Weight loss is of benefit. Handicap license is approved. Dr. Romona Curls secretary/Assistant will call to arrange for lumbar facet injection   Follow-Up Instructions: No Follow-up on file.   Orders:  No orders of the defined types were placed in this encounter.  No orders of the defined types were placed in this encounter.     Procedures: No procedures performed   Clinical Data: No additional findings.   Subjective: Chief Complaint  Patient presents with  . Lower Back - Follow-up    MRI Review--LSP    71 year old male with right low back pain and radiation into the right leg with standing and walking. Tool medrol dose pack with improvement in the pain until he was taking lower doses of The steroid. Can only walk about 300 feet and the pain begins into the right hip and. Even on the treadmill he is limited and will go to using a stationary bike.    Review of Systems    Constitutional: Negative.   HENT: Negative.   Eyes: Negative.   Respiratory: Negative.   Cardiovascular: Negative.   Gastrointestinal: Negative.   Endocrine: Negative.   Genitourinary: Negative.   Musculoskeletal: Negative.   Skin: Negative.   Allergic/Immunologic: Negative.   Neurological: Negative.   Hematological: Negative.   Psychiatric/Behavioral: Negative.      Objective: Vital Signs: BP 129/62 (BP Location: Left Arm, Patient Position: Sitting)   Pulse 66   Ht 6' 1.5" (1.867 m)   Wt 271 lb (122.9 kg)   BMI 35.27 kg/m   Physical Exam  Back Exam   Tenderness  The patient is experiencing tenderness in the lumbar.  Range of Motion  Extension: abnormal  Flexion: normal  Lateral bend right: abnormal  Lateral bend left: normal  Rotation right: abnormal  Rotation left: normal   Muscle Strength  Right Quadriceps:  5/5  Left Quadriceps:  5/5  Right Hamstrings:  5/5  Left Hamstrings:  5/5   Tests  Straight leg raise right: negative Straight leg raise left: negative  Reflexes  Patellar: normal Achilles: normal Biceps: normal Babinski's sign: normal   Other  Toe walk: normal Heel walk: normal Sensation: normal Gait: normal  Erythema: no back redness Scars: present  Comments:  MRI with worsening spondylosis L1-2 non compressive, fusion L5-S1 with 8 mm listhesis, L3-4 and L4-5 spondylosis with  mild foramenal stenosis unchanged from 2011 study.       Specialty Comments:  No specialty comments available.  Imaging: No results found.   PMFS History: Patient Active Problem List   Diagnosis Date Noted  . Chest pain 05/27/2017  . Demand ischemia ([place]) 05/27/2017  . Morbid obesity ([place]) 05/27/2017  . PAF (paroxysmal atrial fibrillation) ([place]) 08/30/2016  . Carotid artery disease ([place]) 08/30/2016  . Essential hypertension 08/30/2016  . Diverticulitis of intestine without perforation or abscess without bleeding   . Hyponatremia 06/04/2016  .  Diverticulitis 05/31/2016  . Iron deficiency 01/05/2016  . Lumbar radiculopathy, chronic 02/04/2015  . SVT (supraventricular tachycardia) ([place]) 12/03/2014  . PAC (premature atrial contraction) 04/05/2014  . OSA (obstructive sleep apnea) 03/27/2014  . CVA (cerebral infarction) 07/06/2012  . Coronary artery disease   . Dyslipidemia   . GERD (gastroesophageal reflux disease)    Past Medical History:  Diagnosis Date  . Arthritis    "back, right knee, hands, ankles, neck" (05/31/2016)  . Atrial fibrillation with RVR ([place])   . Chronic lower back pain   . Coronary atherosclerosis of native coronary artery    a. BMS to [facility] 2004 and 2007, otherwise mild nonobstructive disease. EF normal.  . CVA (cerebral vascular accident) ([place]) 07/2012   AF discovered after stroke; denies residual on 12/04/2014  . Diverticulitis   . Dyslipidemia   . Essential hypertension, benign   . GERD (gastroesophageal reflux disease)   . Lumbar radiculopathy, chronic 02/04/2015   Right L5  . Obesity   . OSA on CPAP   . Paroxysmal atrial fibrillation ([place])    a. Discovered after stroke.  . Pneumonia 01/1996  . PONV (postoperative nausea and vomiting)   . Recurrent upper respiratory infection (URI)     Family History  Problem Relation Age of Onset  . Hypertension Mother   . Heart disease Father   . Heart attack Father     Past Surgical History:  Procedure Laterality Date  . ANTERIOR CERVICAL DECOMP/DISCECTOMY FUSION  07/2001; 10/2002   "C5-6; C6-7; redo"  . BACK SURGERY    . CARPAL TUNNEL RELEASE Left 10/2015  . COLONOSCOPY W/ POLYPECTOMY  02/2014  . CORONARY ANGIOPLASTY WITH STENT PLACEMENT  05/2003; 12/2005   "mid RCA; mid RCA"  . JOINT REPLACEMENT    . KNEE ARTHROSCOPY Left 10/2005  . KNEE ARTHROSCOPY W/ PARTIAL MEDIAL MENISCECTOMY Left 09/2005  . LUMBAR LAMINECTOMY/DECOMPRESSION MICRODISCECTOMY  03/2005   "L4-5"  . POSTERIOR LUMBAR FUSION  10/2003   L5-S1; "plates, screws"  . SHOULDER ARTHROSCOPY Right  08/2011   Debridement of labrum, arthroscopic distal clavicle excision  . SHOULDER OPEN ROTATOR CUFF REPAIR Left 07/2014  . TEE WITHOUT CARDIOVERSION  07/07/2012   Procedure: TRANSESOPHAGEAL ECHOCARDIOGRAM (TEE);  Surgeon: Fay Records, MD;  Location: [facility];  Service: Cardiovascular;  Laterality: N/A;  . TONSILLECTOMY AND ADENOIDECTOMY  ~ 1956  . TOTAL KNEE ARTHROPLASTY Left 10/2006  . TRIGGER FINGER RELEASE Left 10/2015   Social History   Occupational History  . Occupation: Retired    Fish farm manager: DISABLED     Comment: Works at [employer] part time  Tobacco Use  . Smoking status: Former Smoker    Packs/day: 1.00    Years: 34.00    Pack years: 34.00    Types: Cigarettes    Last attempt to quit: 05/05/2003    Years since quitting: 14.3  . Smokeless tobacco: Never Used  Substance and Sexual Activity  . Alcohol use: No  Alcohol/week: 0.0 oz  . Drug use: No  . Sexual activity: Yes

## 2017-09-19 NOTE — Telephone Encounter (Signed)
Patient's identical twin was diagnosed with Parkinson's a couple weeks ago. Since patient is an identical twin should he be tested. He is having some weakness and tremors in his hands, occasional step shuffling, lightheadedness and off balance. He would like to get Dr. Clydene Fake opinion.

## 2017-09-19 NOTE — Patient Instructions (Signed)
Avoid bending, stooping and avoid lifting weights greater than 10 lbs. Avoid prolong standing and walking. Avoid frequent bending and stooping  No lifting greater than 10 lbs. May use ice or moist heat for pain. Weight loss is of benefit. Handicap license is approved. Dr. Romona Curls secretary/Assistant will call to arrange for lumbar facet injection

## 2017-09-19 NOTE — Telephone Encounter (Signed)
Dr.Sethi pt was last seen 2016. Please advise. Should pts PCP refer to Dr.Athar or Dr. Jannifer Franklin.

## 2017-09-19 NOTE — Telephone Encounter (Signed)
Dr Rexene Alberts

## 2017-09-20 ENCOUNTER — Encounter: Payer: Self-pay | Admitting: Cardiovascular Disease

## 2017-09-20 ENCOUNTER — Ambulatory Visit: Payer: Medicare Other | Admitting: Cardiovascular Disease

## 2017-09-20 VITALS — BP 110/72 | HR 62 | Ht 73.0 in | Wt 277.8 lb

## 2017-09-20 DIAGNOSIS — I2510 Atherosclerotic heart disease of native coronary artery without angina pectoris: Secondary | ICD-10-CM

## 2017-09-20 DIAGNOSIS — E782 Mixed hyperlipidemia: Secondary | ICD-10-CM | POA: Diagnosis not present

## 2017-09-20 LAB — LIPID PANEL
Chol/HDL Ratio: 3.3 ratio (ref 0.0–5.0)
Cholesterol, Total: 110 mg/dL (ref 100–199)
HDL: 33 mg/dL — ABNORMAL LOW (ref >39)
LDL Calculated: 53 mg/dL (ref 0–99)
Triglycerides: 119 mg/dL (ref 0–149)
VLDL Cholesterol Cal: 24 mg/dL (ref 5–40)

## 2017-09-20 LAB — HEPATIC FUNCTION PANEL
ALT: 41 IU/L (ref 0–44)
AST: 31 IU/L (ref 0–40)
Albumin: 4.6 g/dL (ref 3.5–4.8)
Alkaline Phosphatase: 47 IU/L (ref 39–117)
Bilirubin Total: 0.5 mg/dL (ref 0.0–1.2)
Bilirubin, Direct: 0.19 mg/dL (ref 0.00–0.40)
Total Protein: 6.7 g/dL (ref 6.0–8.5)

## 2017-09-20 LAB — BASIC METABOLIC PANEL
BUN/Creatinine Ratio: 22 (ref 10–24)
BUN: 20 mg/dL (ref 8–27)
CO2: 23 mmol/L (ref 20–29)
Calcium: 9.8 mg/dL (ref 8.6–10.2)
Chloride: 104 mmol/L (ref 96–106)
Creatinine, Ser: 0.91 mg/dL (ref 0.76–1.27)
GFR calc Af Amer: 98 mL/min/{1.73_m2} (ref >59)
GFR calc non Af Amer: 85 mL/min/{1.73_m2} (ref >59)
Glucose: 103 mg/dL — ABNORMAL HIGH (ref 65–99)
Potassium: 4.9 mmol/L (ref 3.5–5.2)
Sodium: 141 mmol/L (ref 134–144)

## 2017-09-20 NOTE — Telephone Encounter (Addendum)
Rn call patient that if he wants to be evaluated for Parkinsons disease because of some symts he would need a referral. Rn stated Dr.Sethi recommend Dr.Athar. Pt will contact his pcp about the referral. Pts twin brother was recently diagnose with Parkinson disease.Pt verbalized understanding.

## 2017-09-20 NOTE — Patient Instructions (Signed)
Medication Instructions:  Your physician recommends that you continue on your current medications as directed. Please refer to the Current Medication list given to you today.   Labwork: TODAY - cholesterol, liver panel, basic metabolic panel   Testing/Procedures: None Ordered   Follow-Up: Your physician wants you to follow-up in: 1 year with Dr. Acie Fredrickson.  You will receive a reminder letter in the mail two months in advance. If you don't receive a letter, please call our office to schedule the follow-up appointment.   If you need a refill on your cardiac medications before your next appointment, please call your pharmacy.   Thank you for choosing [facility]! Christen Bame, RN [phone]

## 2017-09-20 NOTE — Progress Notes (Signed)
Cardiology Office Note   Date:  09/20/2017   ID:  Corey [name], DOB [birth year], MRN [id number]  PCP:  [name], MD  Cardiologist:   Mertie Moores, MD   Chief Complaint  Patient presents with  . Coronary Artery Disease   Problem List: 1.  . Coronary artery disease-status post PTCA and stenting of his right coronary artery 2. Hypertension 3. Hyperlipidemia 4. 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 Atrial fibrillation - discovered after his stroke  History of Present Illness: Corey [name] is a 71 yo gentleman with a history of coronary artery disease. Status post PTCA and stenting of his right coronary artery. He also has a history of hypertension, and hyperlipidemia. 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  He had a stroke in Oct. He had some speech problems and loss of fine motor control of the right arm. TEE was negative for PFO or LAA thrombus. Event monitor showed no A-Fib. He has been on Plavix since his stents in  January 05, 2013:  Corey [name] had an episode of atrial fib in January. He was started on Xarelto and has done well.  He has had some fatigue on occasion. He monitors his BP.   He has recovered from his stroke.   Feb. 13, 2015:  Corey [name] is doing ok. Still having touble losing weight. Has had a URI for the past month or so. No CP, no palps. Has recovered well from his CVA. He is looking forward to being able to exercise more   December 23, 2014: Corey [name] is a 71 y.o. male who presents following recent hospitalization for rapid atrial fib. He presented to the hospital with syncope /presyncope.  He was found to have rapid atrial fibrillation.  Was started on IV diltiazem and converted by the next am. He has maintained NSR. Has gained some weight due to inactivity.    07/10/2015:  Corey [name] is doing well.   No CP , no dyspnea.  No HR irregularities.   Has had rapid atrial fib in March , 2016 . Is no Xarelto   January 05, 2016:  Corey [name] is  seen back for CAD and HTN.and PAF Also has anemia and has been found to have low iron.  Has had a colonoscopy ~ 2 years ago.   Negative stool guiac.   Nov. 27, 2017: Corey [name] was admitted to the hospital in Sept. 2017 with diverticulitis. Doing well.   No CP or dyspnea.  Due for his carotid duplex - has mild Carotid artery disease.   September 20, 2017:   Corey [name] is seen today for follow-up visit. His twin brother was diagnosed with parkinsons recently . hes worried about that.   Going to see a neurologist.  Gets fatigued on occasion.    No angina .   Exercising some.   Has some back issues.  Is trying to lose some weight .  Occasionally has some left leg swelling     Past Medical History:  Diagnosis Date  . Arthritis    "back, right knee, hands, ankles, neck" (05/31/2016)  . Atrial fibrillation with RVR ([place])   . Chronic lower back pain   . Coronary atherosclerosis of native coronary artery    a. BMS to [facility] 2004 and 2007, otherwise mild nonobstructive disease. EF normal.  . CVA (cerebral vascular accident) ([place]) 07/2012   AF discovered after stroke; denies residual on 12/04/2014  . Diverticulitis   .  Dyslipidemia   . Essential hypertension, benign   . GERD (gastroesophageal reflux disease)   . Lumbar radiculopathy, chronic 02/04/2015   Right L5  . Obesity   . OSA on CPAP   . Paroxysmal atrial fibrillation ([place])    a. Discovered after stroke.  . Pneumonia 01/1996  . PONV (postoperative nausea and vomiting)   . Recurrent upper respiratory infection (URI)     Past Surgical History:  Procedure Laterality Date  . ANTERIOR CERVICAL DECOMP/DISCECTOMY FUSION  07/2001; 10/2002   "C5-6; C6-7; redo"  . BACK SURGERY    . CARPAL TUNNEL RELEASE Left 10/2015  . COLONOSCOPY W/ POLYPECTOMY  02/2014  . CORONARY ANGIOPLASTY WITH STENT PLACEMENT  05/2003; 12/2005   "mid RCA; mid RCA"  . JOINT REPLACEMENT    . KNEE ARTHROSCOPY Left 10/2005  . KNEE ARTHROSCOPY W/ PARTIAL MEDIAL MENISCECTOMY Left  09/2005  . LUMBAR LAMINECTOMY/DECOMPRESSION MICRODISCECTOMY  03/2005   "L4-5"  . POSTERIOR LUMBAR FUSION  10/2003   L5-S1; "plates, screws"  . SHOULDER ARTHROSCOPY Right 08/2011   Debridement of labrum, arthroscopic distal clavicle excision  . SHOULDER OPEN ROTATOR CUFF REPAIR Left 07/2014  . TEE WITHOUT CARDIOVERSION  07/07/2012   Procedure: TRANSESOPHAGEAL ECHOCARDIOGRAM (TEE);  Surgeon: Fay Records, MD;  Location: [facility];  Service: Cardiovascular;  Laterality: N/A;  . TONSILLECTOMY AND ADENOIDECTOMY  ~ 1956  . TOTAL KNEE ARTHROPLASTY Left 10/2006  . TRIGGER FINGER RELEASE Left 10/2015     Current Outpatient Medications  Medication Sig Dispense Refill  . acetaminophen (TYLENOL 8 HOUR ARTHRITIS PAIN) 650 MG CR tablet Take 1,300 mg by mouth 2 (two) times daily as needed for pain (pain).    Marland Kitchen atorvastatin (LIPITOR) 40 MG tablet TAKE 1 TABLET BY MOUTH EVERY DAY 90 tablet 3  . Diclofenac-Misoprostol 75-0.2 MG TBEC Take 1 tablet by mouth 2 (two) times daily after a meal. 60 tablet 3  . fenofibrate 160 MG tablet TAKE 1 TABLET (160 MG TOTAL) BY MOUTH DAILY. 90 tablet 3  . finasteride (PROSCAR) 5 MG tablet Take 5 mg by mouth daily.  11  . fluticasone (FLONASE) 50 MCG/ACT nasal spray Place 1 spray into the nose daily.    Marland Kitchen lisinopril (PRINIVIL,ZESTRIL) 10 MG tablet Take 10 mg by mouth daily.  5  . metoprolol succinate (TOPROL-XL) 50 MG 24 hr tablet Take 2 tablets (100 mg total) by mouth daily. 180 tablet 3  . Multiple Vitamins-Minerals (MULTIVITAMINS THER. W/MINERALS) TABS Take 1 tablet by mouth daily.      . nitroGLYCERIN (NITROSTAT) 0.4 MG SL tablet Place 1 tablet (0.4 mg total) under the tongue every 5 (five) minutes as needed for chest pain. 25 tablet 5  . omeprazole (PRILOSEC) 10 MG capsule Take 1 capsule (10 mg total) by mouth daily. 90 capsule 3  . Probiotic Product (PROBIOTIC PO) Take 1 capsule by mouth 2 (two) times daily.    . rivaroxaban (XARELTO) 20 MG TABS tablet Take 1 tablet  (20 mg total) by mouth daily. 30 tablet 9  . sildenafil (REVATIO) 20 MG tablet Take 2 to 5 tabs daily as needed for erectile dysfunction 30 tablet 2  . Tamsulosin HCl (FLOMAX) 0.4 MG CAPS Take 0.4 mg by mouth daily.       No current facility-administered medications for this visit.     Allergies:   Codeine; Monopril [fosinopril sodium]; Ampicillin; and Percocet [oxycodone-acetaminophen]    Social History:  The patient  reports that he quit smoking about 14 years ago. His smoking  use included cigarettes. He has a 34.00 pack-year smoking history. he has never used smokeless tobacco. He reports that he does not drink alcohol or use drugs.   Family History:  The patient's family history includes Heart attack in his father; Heart disease in his father; Hypertension in his mother.    ROS: As per current history otherwise negative.   Physical Exam: Blood pressure 110/72, pulse 62, height 6\' 1"  (1.854 m), weight 277 lb 12.8 oz (126 kg), SpO2 96 %.  GEN:  Well nourished, well developed in no acute distress HEENT: Normal NECK: No JVD; No carotid bruits LYMPHATICS: No lymphadenopathy CARDIAC: RR, no murmurs, rubs, gallops RESPIRATORY:  Clear to auscultation without rales, wheezing or rhonchi  ABDOMEN: Soft, non-tender, non-distended MUSCULOSKELETAL:  No edema; No deformity  SKIN: Warm and dry NEUROLOGIC:  Alert and oriented x 3   EKG:   Recent Labs: 05/27/2017: ALT 33; BUN 23; Creatinine, Ser 1.15; Hemoglobin 12.8; Platelets 197; Potassium 4.3; Sodium 141    Lipid Panel    Component Value Date/Time   CHOL 116 08/30/2016 0840   CHOL 108 01/05/2013 0748   TRIG 105 08/30/2016 0840   TRIG 84 01/05/2013 0748   HDL 31 (L) 08/30/2016 0840   HDL 29 (L) 01/05/2013 0748   CHOLHDL 3.7 08/30/2016 0840   VLDL 21 08/30/2016 0840   LDLCALC 64 08/30/2016 0840   LDLCALC 62 01/05/2013 0748      Wt Readings from Last 3 Encounters:  09/20/17 277 lb 12.8 oz (126 kg)  09/19/17 271 lb (122.9  kg)  08/23/17 271 lb (122.9 kg)      Other studies Reviewed: Additional studies/ records that were reviewed today include: . Review of the above records demonstrates:    ASSESSMENT AND PLAN:  1.  Coronary artery disease-   No angina .  Doing well   2. Hypertension -  BP is well controlled.   3. Carotid artery disease -  .   4. Hyperlipidemia -      5. 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 Atrial fibrillation - discovered after his stroke-  CHADS2VASC is   28 ( age, CAD, CVA, HTN, ) ,   7. Fatigue:     8. Iron deficiency:  .  Current medicines are reviewed at length with the patient today.  The patient does not have concerns regarding medicines.  The following changes have been made:  no change  Labs/ tests ordered today include:  No orders of the defined types were placed in this encounter.   Disposition:   FU with me in 6 months.     Mertie Moores, MD  09/20/2017 9:07 AM    [facility] [address], [place], [place]  [address] Phone: [phone]; Fax: [phone]

## 2017-10-03 ENCOUNTER — Ambulatory Visit (INDEPENDENT_AMBULATORY_CARE_PROVIDER_SITE_OTHER): Payer: Medicare Other | Admitting: Specialist

## 2017-10-18 ENCOUNTER — Encounter (INDEPENDENT_AMBULATORY_CARE_PROVIDER_SITE_OTHER): Payer: Self-pay

## 2017-11-07 ENCOUNTER — Ambulatory Visit: Payer: Medicare Other | Admitting: Neurology

## 2017-11-07 ENCOUNTER — Encounter: Payer: Self-pay | Admitting: Neurology

## 2017-11-07 VITALS — BP 191/93 | HR 68 | Ht 73.5 in | Wt 278.0 lb

## 2017-11-07 DIAGNOSIS — R251 Tremor, unspecified: Secondary | ICD-10-CM

## 2017-11-07 DIAGNOSIS — R2689 Other abnormalities of gait and mobility: Secondary | ICD-10-CM

## 2017-11-07 NOTE — Progress Notes (Signed)
Subjective:    Patient ID: Corey [name] is a 72 y.o. male.  HPI     Corey Age, MD, PhD [facility] [address] [place], [place] [address]  Dear Dr. Dorthy [name],   I saw your patient, Corey [name], upon your kind request in my neurologic clinic today for initial consultation of his tremors and specific concern for parkinsonism. The patient is accompanied by his wife today. He is a prior patient of Dr. Clydene [name] and Dr. Leonie [name] requested for me to weigh in because of the question about parkinsonism. As you know, Corey [name] is a 72 year old left-handed gentleman with an underlying medical history of hypertension, hyperlipidemia, coronary artery disease with status post PTCA and stenting, history of left MCA embolic stroke in 2130 in the context of A. fib, arthritis, obstructive sleep apnea, reflux disease, remote history of smoking and history of obesity, who reports that his twin brother has recently been diagnosed with atypical parkinsonism. Patient reports difficulty with his balance for the past 6-12 months. He has had a hand tremor affecting both hands for over one year. He has had significant issues with arthritis, he had right thumb joint surgery recently and left wrist surgery for carpal tunnel in the past. He had neck surgery and low back surgery. He reports that his tremor is a little worse in the mornings. He has had some issues bumping into things, he tends to veer to the left at times. He fell about a year ago but tripped over something. His father had a hand tremor.  His Past Medical History Is Significant For: Past Medical History:  Diagnosis Date  . Arthritis    "back, right knee, hands, ankles, neck" (05/31/2016)  . Atrial fibrillation with RVR (Corey [name])   . Chronic lower back pain   . Coronary atherosclerosis of native coronary artery    a. BMS to [facility] 2004 and 2007, otherwise mild nonobstructive disease. EF normal.  . CVA (cerebral vascular  accident) (Corey [name]) 07/2012   AF discovered after stroke; denies residual on 12/04/2014  . Diverticulitis   . Dyslipidemia   . Essential hypertension, benign   . GERD (gastroesophageal reflux disease)   . Lumbar radiculopathy, chronic 02/04/2015   Right L5  . Obesity   . OSA on CPAP   . Paroxysmal atrial fibrillation (Corey [name])    a. Discovered after stroke.  . Pneumonia 01/1996  . PONV (postoperative nausea and vomiting)   . Recurrent upper respiratory infection (URI)     His Past Surgical History Is Significant For: Past Surgical History:  Procedure Laterality Date  . ANTERIOR CERVICAL DECOMP/DISCECTOMY FUSION  07/2001; 10/2002   "C5-6; C6-7; redo"  . BACK SURGERY    . CARPAL TUNNEL RELEASE Left 10/2015  . COLONOSCOPY W/ POLYPECTOMY  02/2014  . CORONARY ANGIOPLASTY WITH STENT PLACEMENT  05/2003; 12/2005   "mid RCA; mid RCA"  . JOINT REPLACEMENT    . KNEE ARTHROSCOPY Left 10/2005  . KNEE ARTHROSCOPY W/ PARTIAL MEDIAL MENISCECTOMY Left 09/2005  . LUMBAR LAMINECTOMY/DECOMPRESSION MICRODISCECTOMY  03/2005   "L4-5"  . POSTERIOR LUMBAR FUSION  10/2003   L5-S1; "plates, screws"  . SHOULDER ARTHROSCOPY Right 08/2011   Debridement of labrum, arthroscopic distal clavicle excision  . SHOULDER OPEN ROTATOR CUFF REPAIR Left 07/2014  . TEE WITHOUT CARDIOVERSION  07/07/2012   Procedure: TRANSESOPHAGEAL ECHOCARDIOGRAM (TEE);  Surgeon: Fay Records, MD;  Location: [facility];  Service: Cardiovascular;  Laterality: N/A;  . TONSILLECTOMY AND ADENOIDECTOMY  ~  Corey [name] ARTHROPLASTY Left 10/2006  . TRIGGER FINGER RELEASE Left 10/2015    His Family History Is Significant For: Family History  Problem Relation [name] of Onset  . Hypertension Mother   . Heart disease Father   . Heart attack Father     His Social History Is Significant For: Social History   Socioeconomic History  . Marital status: Married    Spouse name: Corey [name]  . Number of children: 3  . Years of education: 40  . Highest  education level: None  Social Needs  . Financial resource strain: None  . Food insecurity - worry: None  . Food insecurity - inability: None  . Transportation needs - medical: None  . Transportation needs - non-medical: None  Occupational History  . Occupation: Retired    Corey [name]: DISABLED     Comment: Works at [employer] part time  Tobacco Use  . Smoking status: Former Smoker    Packs/day: 1.00    Years: 34.00    Pack years: 34.00    Types: Cigarettes    Last attempt to quit: 05/05/2003    Years since quitting: 14.5  . Smokeless tobacco: Never Used  Substance and Sexual Activity  . Alcohol use: No    Alcohol/week: 0.0 oz  . Drug use: No  . Sexual activity: Yes  Other Topics Concern  . None  Social History Narrative   Patient lives at home with spouse.   Caffeine Use:     His Allergies Are:  Allergies  Allergen Reactions  . Codeine Nausea Only and Other (See Comments)    Heavy amounts cause nausea  . Monopril [Fosinopril Sodium] Other (See Comments)    Muscle aches & pain  . Ampicillin Rash  . Percocet [Oxycodone-Acetaminophen] Nausea And Vomiting  :   His Current Medications Are:  Outpatient Encounter Medications as of 11/07/2017  Medication Sig  . acetaminophen (TYLENOL 8 HOUR ARTHRITIS PAIN) 650 MG CR tablet Take 1,300 mg by mouth 2 (two) times daily as needed for pain (pain).  Marland Kitchen ALPRAZolam (XANAX) 0.5 MG tablet Take 0.5 mg by mouth at bedtime as needed.  Marland Kitchen atorvastatin (LIPITOR) 40 MG tablet TAKE 1 TABLET BY MOUTH EVERY DAY  . Diclofenac-Misoprostol 75-0.2 MG TBEC Take 1 tablet by mouth 2 (two) times daily after a meal. (Patient taking differently: Take 1 tablet by mouth 2 (two) times daily as needed. )  . fenofibrate 160 MG tablet TAKE 1 TABLET (160 MG TOTAL) BY MOUTH DAILY.  . finasteride (PROSCAR) 5 MG tablet Take 5 mg by mouth daily.  . fluticasone (FLONASE) 50 MCG/ACT nasal spray Place 1 spray into the nose daily.  Marland Kitchen lisinopril (PRINIVIL,ZESTRIL) 10 MG  tablet Take 10 mg by mouth daily.  . metoprolol succinate (TOPROL-XL) 50 MG 24 hr tablet Take 2 tablets (100 mg total) by mouth daily.  . Multiple Vitamins-Minerals (MULTIVITAMINS THER. W/MINERALS) TABS Take 1 tablet by mouth daily.    . nitroGLYCERIN (NITROSTAT) 0.4 MG SL tablet Place 1 tablet (0.4 mg total) under the tongue every 5 (five) minutes as needed for chest pain.  Marland Kitchen omeprazole (PRILOSEC) 10 MG capsule Take 1 capsule (10 mg total) by mouth daily.  . Probiotic Product (PROBIOTIC PO) Take 1 capsule by mouth 2 (two) times daily.  . rivaroxaban (XARELTO) 20 MG TABS tablet Take 1 tablet (20 mg total) by mouth daily.  . sildenafil (REVATIO) 20 MG tablet Take 2 to 5 tabs daily as needed for erectile dysfunction  .  Tamsulosin HCl (FLOMAX) 0.4 MG CAPS Take 0.4 mg by mouth daily.     No facility-administered encounter medications on file as of 11/07/2017.   : Review of Systems:  Out of a complete 14 point review of systems, all are reviewed and negative with the exception of these symptoms as listed below: Review of Systems  Neurological:       Pt presents today to discuss his risk factors for atypical parkinson's. His twin brother was recently diagnosed with atypical parkinson's.    Objective:  Neurological Exam  Physical Exam Physical Examination:   Vitals:   11/07/17 1427  BP: (!) 191/93  Pulse: 68   He denies any symptoms from his blood pressure, no chest pain, no shortness of breath, no headache, no blurry vision.  General Examination: The patient is a very pleasant 72 y.o. male in no acute distress. He appears well-developed and well-nourished and well groomed.   HEENT: Normocephalic, atraumatic, pupils are equal, round and reactive to light and accommodation. Funduscopic exam is normal with sharp disc margins noted. Extraocular tracking is good without limitation to gaze excursion or nystagmus noted. Normal smooth pursuit is noted. Hearing is grossly intact. Tympanic membranes  are clear bilaterally. Face is symmetric with normal facial animation and normal facial sensation. Speech is clear with no dysarthria noted. There is no hypophonia. There is no lip, neck/head, jaw or voice tremor. Neck is supple with full range of passive and active motion. There are no carotid bruits on auscultation. Oropharynx exam reveals: mild mouth dryness, good dental hygiene and mild airway crowding. Tongue protrudes centrally and palate elevates symmetrically.   Chest: Clear to auscultation without wheezing, rhonchi or crackles noted.  Heart: S1+S2+0, regular and normal without murmurs, rubs or gallops noted.   Abdomen: Soft, non-tender and non-distended with normal bowel sounds appreciated on auscultation.  Extremities: There is trace pitting edema in the distal lower extremities bilaterally. Pedal pulses are intact.  Skin: Warm and dry without trophic changes noted.  Musculoskeletal: exam reveals no obvious joint deformities, tenderness or joint swelling or erythema.    Neurologically:  Mental status: The patient is awake, alert and oriented in all 4 spheres. His immediate and remote memory, attention, language skills and fund of knowledge are appropriate. There is no evidence of aphasia, agnosia, apraxia or anomia. Speech is clear with normal prosody and enunciation. Thought process is linear. Mood is normal and affect is normal.  Cranial nerves II - XII are as described above under HEENT exam. In addition: shoulder shrug is normal with equal shoulder height noted. Motor exam: Normal bulk, strength and tone is noted. There is no drift, or rebound.   On 11/07/2017: On Archimedes spiral drawing, he has no difficulty with her left hand which is his dominant hand, with the right hand he has coarse trembling, handwriting is not tremulous, not micrographic, legible. He has a very mild bilateral upper extremity postural and minimal action tremor bilaterally. He has no resting tremor.  Romberg  is negative, except for mild sway. Reflexes are 1+ throughout, except absent in the ankles. Fine motor skills and coordination: intact for [name] with normal finger taps, normal hand movements, normal rapid alternating patting, normal foot taps and normal foot agility.  Cerebellar testing: No dysmetria or intention tremor on finger to nose testing. There is no truncal or gait ataxia.  Sensory exam: intact to light touch in the upper and lower extremities.  Gait, station and balance: He stands easily. No veering to one  side is noted. No leaning to one side is noted. Posture is [name]-appropriate and stance is narrow based. Gait shows normal stride length and normal pace. Preserved arm swing.                Assessment and Plan:    In summary, LEAVY HEATHERLY is a very pleasant 72 y.o.-year old male with an underlying medical history of hypertension, hyperlipidemia, coronary artery disease with status post PTCA and stenting, history of left MCA embolic stroke in 0076 in the context of A. fib, arthritis, obstructive sleep apnea, reflux disease, remote history of smoking and history of obesity, who presents for neurologic consultation of his tremors and balance issues. He has a twin brother who was recently diagnosed with atypical parkinsonism per patient's report. On examination, he has a mild hand tremor. This is more likely in keeping with essential tremor. He has no obvious signs of parkinsonism and I reassured him and his wife in that regard. He has a history of stroke and we can certainly do a repeat right MRI for comparison and to rule out any other structural causes for his tremors. I did not suggest any symptomatic treatment of his tremors. He is already on a beta blocker. He's had some fatigue, after which the beta blocker dose was reduced from 150 mg to 100 mg once daily. This helped. I asked patient to stay well-hydrated with water, he reports occasional lightheadedness upon sudden change in position. I  suggested as needed follow-up, if needed he can follow-up with my colleague Dr. Leonie [name]. I ordered a brain MRI without contrast today. We will request an open MRI per patient request. We will call him with his test results. I answered all their questions today and the patient and his wife were in agreement.   Thank you very much for allowing me to participate in the care of this nice patient. If I can be of any further assistance to you please do not hesitate to call me at [phone].  Sincerely,   Corey Age, MD, PhD

## 2017-11-07 NOTE — Patient Instructions (Signed)
  I do not see any signs or symptoms of parkinson's like disease or what we call parkinsonism, thankfully.   You do have a mild appearing tremor affecting both hands. For this, I would not recommend any new medication for fear of side effects or medication interaction.   I do want to suggest a few things today:  We will do a brain scan, called MRI and call you with the test results. We will have to schedule you for this on a separate date. This test requires authorization from your insurance, and we will take care of the insurance process.  Remember to drink plenty of fluid at least 6 glasses (8 oz each), eat healthy meals and do not skip any meals. Try to eat protein with a every meal and eat a healthy snack such as fruit or nuts in between meals. Try to keep a regular sleep-wake schedule and try to exercise daily, particularly in the form of walking, 20-30 minutes a day, if you can.   Try to stay active physically and mentally. Engage in social activities in your community and with your family and try to keep up with current events by reading the newspaper or watching the news. Try to do word puzzles and you may like to do puzzles and brain games on the computer such as on [email or web address]   As far as your medications are concerned, I would not suggest any new medications at this time.   As far as diagnostic testing, I may order an brain MRI. We would have to schedule you for this on a separate date. This test requires authorization from your insurance, and we will take care of the insurance process.  You can follow with Dr. Leonie Man as needed.   Our phone number is [phone]. We also have an after hours call service for urgent matters and there is a physician on-call for urgent questions, that cannot wait till the next work day. For any emergencies you know to call 911 or go to the nearest emergency room.

## 2017-11-24 ENCOUNTER — Telehealth: Payer: Self-pay | Admitting: *Deleted

## 2017-11-24 ENCOUNTER — Other Ambulatory Visit: Payer: Medicare Other

## 2017-11-24 NOTE — Telephone Encounter (Signed)
Completed prior authorization for SIDENAFIL through covermymeds.

## 2017-11-28 NOTE — Telephone Encounter (Signed)
Sidenafil PA denied per fax from [facility]. Reason: Drugs when used for the treatment of ED are excluded from coverage under medicare rules.  Per letter they have notified the pt.  Pharmacy notified.

## 2017-12-05 ENCOUNTER — Inpatient Hospital Stay: Admission: RE | Admit: 2017-12-05 | Payer: Medicare Other | Source: Ambulatory Visit

## 2018-03-30 ENCOUNTER — Ambulatory Visit: Payer: Medicare Other | Admitting: Pulmonary Disease

## 2018-03-30 ENCOUNTER — Encounter: Payer: Self-pay | Admitting: Pulmonary Disease

## 2018-03-30 DIAGNOSIS — G4733 Obstructive sleep apnea (adult) (pediatric): Secondary | ICD-10-CM | POA: Diagnosis not present

## 2018-03-30 NOTE — Patient Instructions (Signed)
Prescription for new auto CPAP 5 to 15 cm will be sent to [facility] CPAP supplies will be renewed for a year. Trial of new air fit F 30 fullface mask

## 2018-03-30 NOTE — Assessment & Plan Note (Signed)
Prescription for new auto CPAP 5 to 15 cm will be sent to [facility] CPAP supplies will be renewed for a year. Trial of new air fit F 30 fullface mask   Weight loss encouraged, compliance with goal of at least 4-6 hrs every night is the expectation. Advised against medications with sedative side effects Cautioned against driving when sleepy - understanding that sleepiness will vary on a day to day basis

## 2018-03-30 NOTE — Progress Notes (Signed)
   Subjective:    Patient ID: Corey [name], male    DOB: [birth year], 72 y.o.   MRN: [id number]  HPI  72 yo male with severe OSA on CPAP since 2006. He has paroxysmal atrial fibrillation on Xarelto and history of CVA in 2013  Chief Complaint  Patient presents with  . Follow-up    Has a full mask but does not feel like fits well.He says it is leaking.   He is on a second machine which she has had for about 10 years and desires a replacement. He is also having a leak from his full facemask. CPAP is certainly helped improve his daytime somnolence and fatigue.  He is very compliant with this and denies any sleep pressure. He has lost 18 pounds from 279 to his current weight of 261 over the last year.  CPAP download was reviewed which shows excellent compliance, minimal residual events and average pressure of 13 cm. He likes the auto function because it starts of low and gives him about 15 to 20 minutes to fall asleep.     Significant tests/ events reviewed  NPSG 2006: AHI 42/hr   Past Medical History:  Diagnosis Date  . Arthritis    "back, right knee, hands, ankles, neck" (05/31/2016)  . Atrial fibrillation with RVR ([place])   . Chronic lower back pain   . Coronary atherosclerosis of native coronary artery    a. BMS to [facility] 2004 and 2007, otherwise mild nonobstructive disease. EF normal.  . CVA (cerebral vascular accident) ([place]) 07/2012   AF discovered after stroke; denies residual on 12/04/2014  . Diverticulitis   . Dyslipidemia   . Essential hypertension, benign   . GERD (gastroesophageal reflux disease)   . Lumbar radiculopathy, chronic 02/04/2015   Right L5  . Obesity   . OSA on CPAP   . Paroxysmal atrial fibrillation ([place])    a. Discovered after stroke.  . Pneumonia 01/1996  . PONV (postoperative nausea and vomiting)   . Recurrent upper respiratory infection (URI)      Review of Systems Patient denies significant dyspnea,cough, hemoptysis,  chest pain, palpitations,  pedal edema, orthopnea, paroxysmal nocturnal dyspnea, lightheadedness, nausea, vomiting, abdominal or  leg pains      Objective:   Physical Exam  Gen. Pleasant, obese, in no distress ENT - no lesions, no post nasal drip Neck: No JVD, no thyromegaly, no carotid bruits Lungs: no use of accessory muscles, no dullness to percussion, decreased without rales or rhonchi  Cardiovascular: Rhythm regular, heart sounds  normal, no murmurs or gallops, no peripheral edema Musculoskeletal: No deformities, no cyanosis or clubbing , no tremors       Assessment & Plan:

## 2018-03-30 NOTE — Addendum Note (Signed)
Addended by: Valerie Salts on: 03/30/2018 10:53 AM   Modules accepted: Orders

## 2018-04-09 ENCOUNTER — Other Ambulatory Visit: Payer: Self-pay | Admitting: Cardiovascular Disease

## 2018-05-09 ENCOUNTER — Telehealth: Payer: Self-pay | Admitting: Cardiovascular Disease

## 2018-05-09 NOTE — Telephone Encounter (Signed)
° °  [facility] Pre-operative Risk Assessment    Request for surgical clearance:  1. What type of surgery is being performed? Colonoscopy/ Endoscopy  2. When is this surgery scheduled? 08/01/2018  3. What type of clearance is required (medical clearance vs. Pharmacy clearance to hold med vs. Both)? Both   4. Are there any medications that need to be held prior to surgery and how long? Xarelto and please advise how long the medication need to be held.   5. Practice name and name of physician performing surgery? [facility], Endoscopy, Dr. Alessandra Bevels   6. What is your office phone number [phone]   7.   What is your office fax number [phone]  8.   Anesthesia type (None, local, MAC, general) ? Not listed.    Corey [name] 05/09/2018, 2:55 PM  _________________________________________________________________   (provider comments below)

## 2018-05-15 NOTE — Telephone Encounter (Signed)
Routing to pharmacy.   Patient will need phone call.   Burtis Junes, RN, [place] [address] [place], [place]  [address] [phone]

## 2018-05-15 NOTE — Telephone Encounter (Signed)
Patient with diagnosis of Afib on Xarelto for anticoagulation.    Procedure: colonoscopy/endoscopy Date of procedure: 08/01/18  CHADS2-VASc score of  5 (CHF, HTN, AGE, DM2, stroke/tia x 2, CAD, AGE, male)  CrCl 13ml/min  Per office protocol, patient can hold Xarelto for 24 hours prior to procedure.  With history of stroke would recommend resume anticoagulation as soon as safe post procedure.

## 2018-05-16 NOTE — Telephone Encounter (Signed)
   Primary Cardiologist: Dr Acie Fredrickson  Chart reviewed and patient interviewed over the phone today as part of pre-operative protocol coverage. Given past medical history and time since last visit, based on ACC/AHA guidelines, Corey [name] would be at acceptable risk for the planned procedure without further cardiovascular testing.   Hold Xarelto 24 hours pre op.   I will route this recommendation to the requesting party via Epic fax function and remove from pre-op pool.  Please call with questions.  Kerin Ransom, PA-C 05/16/2018, 2:53 PM

## 2018-07-11 ENCOUNTER — Telehealth: Payer: Self-pay | Admitting: Pulmonary Disease

## 2018-07-11 DIAGNOSIS — G4733 Obstructive sleep apnea (adult) (pediatric): Secondary | ICD-10-CM

## 2018-07-11 NOTE — Telephone Encounter (Signed)
Per 03/30/18 OV with Dr. Elsworth Soho- Prescription for new auto CPAP 5 to 15 cm will be sent to [facility] CPAP supplies will be renewed for a year. Trial of new air fit F 30 fullface mask  Called and spoke with Jeanett Schlein, [facility].  She stated that there was no order for CPAP supplies.  Placed new order for CPAP supplies with [facility], [place], [phone]. Left message for Patient re guarding CPAP mask.  Nothing further at this time.

## 2018-07-12 ENCOUNTER — Telehealth: Payer: Self-pay | Admitting: Pulmonary Disease

## 2018-07-12 NOTE — Telephone Encounter (Signed)
Spoke with the pt and notified we had called to let him know that his CPAP supplies have been ordered  Nothing further needed

## 2018-07-13 ENCOUNTER — Other Ambulatory Visit: Payer: Self-pay | Admitting: Cardiovascular Disease

## 2018-07-13 NOTE — Telephone Encounter (Signed)
Pt is a 72 yr old male. Last weight was 118.8Kg 03/30/18. SCr on 12/181/18 was 0.91 when pt saw Dr Acie Fredrickson. CrCL was 144mL/min. Will refill Xarelto 20mg  QD.

## 2018-07-14 ENCOUNTER — Other Ambulatory Visit: Payer: Self-pay | Admitting: Physician Assistant

## 2018-08-10 ENCOUNTER — Other Ambulatory Visit: Payer: Self-pay | Admitting: Physician Assistant

## 2018-08-10 ENCOUNTER — Telehealth: Payer: Self-pay | Admitting: Pulmonary Disease

## 2018-08-10 NOTE — Telephone Encounter (Signed)
Left message for patient to call back  

## 2018-08-14 NOTE — Telephone Encounter (Signed)
lmom to call back 

## 2018-08-15 NOTE — Telephone Encounter (Signed)
Attempted to call pt but no answer. Left message for pt to return call. 

## 2018-08-16 NOTE — Telephone Encounter (Signed)
Called and spoke with patient he is aware and verbalized understanding. Nothing further needed.  

## 2018-08-16 NOTE — Telephone Encounter (Signed)
Called and spoke with Corey [name] from [facility], she stated that the patient is not eligible until 08/18/18 to get supplies. Called patient unable to reach left message to give [place] a call back.

## 2018-08-16 NOTE — Telephone Encounter (Signed)
Patient returned call, CB is [phone]

## 2018-08-16 NOTE — Telephone Encounter (Signed)
Patient called back to speak with [facility].

## 2018-08-16 NOTE — Telephone Encounter (Signed)
atc will call back

## 2018-08-18 ENCOUNTER — Ambulatory Visit (HOSPITAL_COMMUNITY)
Admission: RE | Admit: 2018-08-18 | Discharge: 2018-08-18 | Disposition: A | Discharge status: Home / Self Care | Payer: Medicare Other | Source: Ambulatory Visit | Attending: Cardiology | Admitting: Cardiology

## 2018-08-18 DIAGNOSIS — I6523 Occlusion and stenosis of bilateral carotid arteries: Secondary | ICD-10-CM | POA: Diagnosis not present

## 2018-08-19 ENCOUNTER — Other Ambulatory Visit: Payer: Self-pay | Admitting: Physician Assistant

## 2018-08-21 ENCOUNTER — Encounter: Payer: Self-pay | Admitting: Physician Assistant

## 2018-08-25 ENCOUNTER — Telehealth: Payer: Self-pay | Admitting: Cardiovascular Disease

## 2018-08-25 NOTE — Telephone Encounter (Signed)
Pt advised carotid results. Will keep follow up with Dr. Acie Fredrickson 09/2018.

## 2018-08-25 NOTE — Telephone Encounter (Signed)
  Patient was returning a call for his results

## 2018-08-29 ENCOUNTER — Other Ambulatory Visit: Payer: Self-pay

## 2018-08-29 DIAGNOSIS — I6523 Occlusion and stenosis of bilateral carotid arteries: Secondary | ICD-10-CM

## 2018-09-06 ENCOUNTER — Telehealth: Payer: Self-pay | Admitting: *Deleted

## 2018-09-06 NOTE — Telephone Encounter (Signed)
.    Primary Cardiologist:Philip Nahser, MD  Chart reviewed as part of pre-operative protocol coverage. Because of Corey [name]'s past medical history and time since last visit, he/she will require a follow-up visit in order to better assess preoperative cardiovascular risk.  He has his yearly f/u with Dr. Acie Fredrickson 09/20/18  Pre-op covering staff: - Please update appt notes for 12/18 to also say needs surgical clearance, if not already stated.   If applicable, this message will also be routed to pharmacy pool and/or primary cardiologist for input on holding anticoagulant/antiplatelet agent as requested below so that this information is available at time of patient's appointment.   Lyda Jester, PA-C  09/06/2018, 4:54 PM

## 2018-09-06 NOTE — Telephone Encounter (Signed)
   [facility] Pre-operative Risk Assessment    Request for surgical clearance:  1. What type of surgery is being performed? LEFT    2. When is this surgery scheduled? 10/24/2018   3. What type of clearance is required (medical clearance vs. Pharmacy clearance to hold med vs. Both)?  BOTH  4. Are there any medications that need to be held prior to surgery and how long? XARELTO 3 DAYS   5. Practice name and name of physician performing surgery? [facility] DR. GRAMMIG   6. What is your office phone number [phone]    7.   What is your office fax number [phone]  8.   Anesthesia type (None, local, MAC, general) ?  GENERAL   Corey [name] 09/06/2018, 10:12 AM  _________________________________________________________________   (provider comments below)

## 2018-09-06 NOTE — Telephone Encounter (Signed)
Follow up   Per Judeen Hammans will be doing a Carpal Metacarpal Arthroplasty. This is the name of the procedure.

## 2018-09-07 NOTE — Telephone Encounter (Signed)
Patient with diagnosis of atrial fibrillation on Xarelto for anticoagulation.    Procedure: left carpal metacarpal arthroplasty Date of procedure: 10/24/18  CHADS2-VASc score of  5 (CHF, HTN, AGE, DM2, stroke/tia x 2, CAD, AGE, male)  CrCl 112.7 Platelet count 197 (from 2018)  Per office protocol, patient can hold Xarelto for 1 days prior to procedure.    Patient should restart Xarelto on the evening of procedure or day after, at discretion of procedure MD

## 2018-09-20 ENCOUNTER — Ambulatory Visit: Payer: Medicare Other | Admitting: Cardiovascular Disease

## 2018-09-20 ENCOUNTER — Encounter: Payer: Self-pay | Admitting: Cardiovascular Disease

## 2018-09-20 VITALS — BP 116/72 | HR 55 | Ht 73.0 in | Wt 270.0 lb

## 2018-09-20 DIAGNOSIS — E782 Mixed hyperlipidemia: Secondary | ICD-10-CM | POA: Diagnosis not present

## 2018-09-20 DIAGNOSIS — I6523 Occlusion and stenosis of bilateral carotid arteries: Secondary | ICD-10-CM | POA: Diagnosis not present

## 2018-09-20 DIAGNOSIS — I2510 Atherosclerotic heart disease of native coronary artery without angina pectoris: Secondary | ICD-10-CM

## 2018-09-20 LAB — BASIC METABOLIC PANEL
BUN/Creatinine Ratio: 20 (ref 10–24)
BUN: 23 mg/dL (ref 8–27)
CO2: 20 mmol/L (ref 20–29)
Calcium: 9.5 mg/dL (ref 8.6–10.2)
Chloride: 102 mmol/L (ref 96–106)
Creatinine, Ser: 1.17 mg/dL (ref 0.76–1.27)
GFR calc Af Amer: 72 mL/min/{1.73_m2} (ref >59)
GFR calc non Af Amer: 62 mL/min/{1.73_m2} (ref >59)
Glucose: 98 mg/dL (ref 65–99)
Potassium: 4.9 mmol/L (ref 3.5–5.2)
Sodium: 136 mmol/L (ref 134–144)

## 2018-09-20 LAB — HEPATIC FUNCTION PANEL
ALT: 37 IU/L (ref 0–44)
AST: 29 IU/L (ref 0–40)
Albumin: 4.4 g/dL (ref 3.5–4.8)
Alkaline Phosphatase: 59 IU/L (ref 39–117)
Bilirubin Total: 0.5 mg/dL (ref 0.0–1.2)
Bilirubin, Direct: 0.18 mg/dL (ref 0.00–0.40)
Total Protein: 6.9 g/dL (ref 6.0–8.5)

## 2018-09-20 LAB — LIPID PANEL
Chol/HDL Ratio: 3.4 ratio (ref 0.0–5.0)
Cholesterol, Total: 129 mg/dL (ref 100–199)
HDL: 38 mg/dL — ABNORMAL LOW (ref >39)
LDL Calculated: 75 mg/dL (ref 0–99)
Triglycerides: 81 mg/dL (ref 0–149)
VLDL Cholesterol Cal: 16 mg/dL (ref 5–40)

## 2018-09-20 MED ORDER — METOPROLOL SUCCINATE ER 50 MG PO TB24: 100.0000 mg | ORAL_TABLET | Freq: Every day | ORAL | 3 refills | Status: DC

## 2018-09-20 MED ORDER — NITROGLYCERIN 0.4 MG SL SUBL: 0.4000 mg | SUBLINGUAL_TABLET | SUBLINGUAL | 5 refills | Status: DC | PRN

## 2018-09-20 MED ORDER — OMEPRAZOLE 10 MG PO CPDR: 10.0000 mg | DELAYED_RELEASE_CAPSULE | Freq: Every day | ORAL | 3 refills | Status: DC

## 2018-09-20 MED ORDER — ATORVASTATIN CALCIUM 40 MG PO TABS: 40.0000 mg | ORAL_TABLET | Freq: Every day | ORAL | 3 refills | Status: DC

## 2018-09-20 MED ORDER — LISINOPRIL 10 MG PO TABS: 10.0000 mg | ORAL_TABLET | Freq: Every day | ORAL | 3 refills | Status: DC

## 2018-09-20 NOTE — Telephone Encounter (Signed)
    Pt is at low risk for his hand surgery  Xarelto instructions have been provided.     Mertie Moores, MD  09/20/2018 9:56 AM    [address],  [place], [place]  [address] Pager [phone] Phone: [phone]; Fax: [phone]

## 2018-09-20 NOTE — Progress Notes (Signed)
Cardiology Office Note   Date:  09/20/2018   ID:  Corey [name], DOB 1946/06/07, MRN [id number]  PCP:  [name], MD  Cardiologist:   Mertie Moores, MD   Chief Complaint  Patient presents with  . Coronary Artery Disease   Problem List: 1.  . Coronary artery disease-status post PTCA and stenting of his right coronary artery 2. Hypertension 3. Hyperlipidemia 4. 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 Atrial fibrillation - discovered after his stroke    Corey [name] is a 72 yo gentleman with a history of coronary artery disease. Status post PTCA and stenting of his right coronary artery. He also has a history of hypertension, and hyperlipidemia. 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  He had a stroke in Oct. He had some speech problems and loss of fine motor control of the right arm. TEE was negative for PFO or LAA thrombus. Event monitor showed no A-Fib. He has been on Plavix since his stents in  January 05, 2013:  Cornie had an episode of atrial fib in January. He was started on Xarelto and has done well.  He has had some fatigue on occasion. He monitors his BP.   He has recovered from his stroke.   Feb. 13, 2015:  Corey [name] is doing ok. Still having touble losing weight. Has had a URI for the past month or so. No CP, no palps. Has recovered well from his CVA. He is looking forward to being able to exercise more   December 23, 2014: Corey [name] is a 72 y.o. male who presents following recent hospitalization for rapid atrial fib. He presented to the hospital with syncope /presyncope.  He was found to have rapid atrial fibrillation.  Was started on IV diltiazem and converted by the next am. He has maintained NSR. Has gained some weight due to inactivity.    07/10/2015:  Corey [name] is doing well.   No CP , no dyspnea.  No HR irregularities.   Has had rapid atrial fib in March , 2016 . Is no Xarelto   January 05, 2016:  Corey [name] is seen back for CAD and  HTN.and PAF Also has anemia and has been found to have low iron.  Has had a colonoscopy ~ 2 years ago.   Negative stool guiac.   Nov. 27, 2017: Corey [name] was admitted to the hospital in Sept. 2017 with diverticulitis. Doing well.   No CP or dyspnea.  Due for his carotid duplex - has mild Carotid artery disease.   September 20, 2017:   Corey [name] is seen today for follow-up visit. His twin brother was diagnosed with parkinsons recently . hes worried about that.   Going to see a neurologist.  Gets fatigued on occasion.    No angina .   Exercising some.   Has some back issues.  Is trying to lose some weight .  Occasionally has some left leg swelling   Dec. 18, 2019 Corey [name] was seen today for follow-up of his coronary artery disease, PAF,  hypertension, hyperlipidemia. Has had some orthostasis  Has lost some weight .  Wt = 270 lbs today  Exercising more,   Needs left hand surgery in January .  Needs cardiac clearance  Is on Xarelto for PAF  - will hold for 2 days prior to hand surgery    Has stable carotid artery disease  Repeat carotid duplex is ordered for next year  Past Medical History:  Diagnosis Date  . Arthritis    "back, right knee, hands, ankles, neck" (05/31/2016)  . Atrial fibrillation with RVR ([place])   . Carotid artery disease ([place]) 08/30/2016   Carotid US 11/19: R 1-39, L 40-59, repeat 12 months  . Chronic lower back pain   . Coronary atherosclerosis of native coronary artery    a. BMS to [facility] 2004 and 2007, otherwise mild nonobstructive disease. EF normal.  . CVA (cerebral vascular accident) ([place]) 07/2012   AF discovered after stroke; denies residual on 12/04/2014  . Diverticulitis   . Dyslipidemia   . Essential hypertension, benign   . GERD (gastroesophageal reflux disease)   . Lumbar radiculopathy, chronic 02/04/2015   Right L5  . Obesity   . OSA on CPAP   . Paroxysmal atrial fibrillation ([place])    a. Discovered after stroke.  . Pneumonia 01/1996  . PONV (postoperative  nausea and vomiting)   . Recurrent upper respiratory infection (URI)     Past Surgical History:  Procedure Laterality Date  . ANTERIOR CERVICAL DECOMP/DISCECTOMY FUSION  07/2001; 10/2002   "C5-6; C6-7; redo"  . BACK SURGERY    . CARPAL TUNNEL RELEASE Left 10/2015  . COLONOSCOPY W/ POLYPECTOMY  02/2014  . CORONARY ANGIOPLASTY WITH STENT PLACEMENT  05/2003; 12/2005   "mid RCA; mid RCA"  . JOINT REPLACEMENT    . KNEE ARTHROSCOPY Left 10/2005  . KNEE ARTHROSCOPY W/ PARTIAL MEDIAL MENISCECTOMY Left 09/2005  . LUMBAR LAMINECTOMY/DECOMPRESSION MICRODISCECTOMY  03/2005   "L4-5"  . POSTERIOR LUMBAR FUSION  10/2003   L5-S1; "plates, screws"  . SHOULDER ARTHROSCOPY Right 08/2011   Debridement of labrum, arthroscopic distal clavicle excision  . SHOULDER OPEN ROTATOR CUFF REPAIR Left 07/2014  . TEE WITHOUT CARDIOVERSION  07/07/2012   Procedure: TRANSESOPHAGEAL ECHOCARDIOGRAM (TEE);  Surgeon: Fay Records, MD;  Location: [facility];  Service: Cardiovascular;  Laterality: N/A;  . TONSILLECTOMY AND ADENOIDECTOMY  ~ 1956  . TOTAL KNEE ARTHROPLASTY Left 10/2006  . TRIGGER FINGER RELEASE Left 10/2015     Current Outpatient Medications  Medication Sig Dispense Refill  . acetaminophen (TYLENOL 8 HOUR ARTHRITIS PAIN) 650 MG CR tablet Take 1,300 mg by mouth 2 (two) times daily as needed for pain (pain).     Marland Kitchen ALPRAZolam (XANAX) 0.5 MG tablet Take 0.5 mg by mouth at bedtime as needed.  1  . Diclofenac-Misoprostol 75-0.2 MG TBEC Take 1 tablet by mouth 2 (two) times daily after a meal. (Patient taking differently: Take 1 tablet by mouth 2 (two) times daily as needed. ) 60 tablet 3  . fenofibrate 160 MG tablet TAKE 1 TABLET BY MOUTH EVERY DAY Please call and schedule an appointment for further refills 1st attempt 90 tablet 0  . finasteride (PROSCAR) 5 MG tablet Take 5 mg by mouth daily.  11  . fluticasone (FLONASE) 50 MCG/ACT nasal spray Place 1 spray into the nose daily.    . Multiple Vitamins-Minerals  (MULTIVITAMINS THER. W/MINERALS) TABS Take 1 tablet by mouth daily.      . Probiotic Product (PROBIOTIC PO) Take 1 capsule by mouth 2 (two) times daily.    . sildenafil (REVATIO) 20 MG tablet Take 2 to 5 tabs daily as needed for erectile dysfunction 30 tablet 2  . Tamsulosin HCl (FLOMAX) 0.4 MG CAPS Take 0.4 mg by mouth daily.      Alveda Reasons 20 MG TABS tablet TAKE 1 TABLET BY MOUTH EVERY DAY 90 tablet 0  . atorvastatin (LIPITOR) 40  MG tablet Take 1 tablet (40 mg total) by mouth daily. 90 tablet 3  . lisinopril (PRINIVIL,ZESTRIL) 10 MG tablet Take 1 tablet (10 mg total) by mouth daily. 90 tablet 3  . metoprolol succinate (TOPROL-XL) 50 MG 24 hr tablet Take 2 tablets (100 mg total) by mouth daily. Take with or immediately following a meal. 180 tablet 3  . nitroGLYCERIN (NITROSTAT) 0.4 MG SL tablet Place 1 tablet (0.4 mg total) under the tongue every 5 (five) minutes as needed for chest pain. 25 tablet 5  . omeprazole (PRILOSEC) 10 MG capsule Take 1 capsule (10 mg total) by mouth daily. 90 capsule 3   No current facility-administered medications for this visit.     Allergies:   Codeine; Monopril [fosinopril sodium]; Ampicillin; and Percocet [oxycodone-acetaminophen]    Social History:  The patient  reports that he quit smoking about 15 years ago. His smoking use included cigarettes. He has a 34.00 pack-year smoking history. He has never used smokeless tobacco. He reports that he does not drink alcohol or use drugs.   Family History:  The patient's family history includes Heart attack in his father; Heart disease in his father; Hypertension in his mother.    ROS: As per current history otherwise negative.   Physical Exam: Blood pressure 116/72, pulse (!) 55, height 6\' 1"  (1.854 m), weight 270 lb (122.5 kg), SpO2 95 %.  GEN:  Well nourished, well developed in no acute distress HEENT: Normal NECK: No JVD; No carotid bruits LYMPHATICS: No lymphadenopathy CARDIAC: RRR   RESPIRATORY:  Clear  to auscultation without rales, wheezing or rhonchi  ABDOMEN: Soft, non-tender, non-distended MUSCULOSKELETAL:  No edema; No deformity  SKIN: Warm and dry NEUROLOGIC:  Alert and oriented x 3   EKG:   September 20, 2018: Sinus bradycardia at 55.  Otherwise normal EKG.  Recent Labs: No results found for requested labs within last 8760 hours.    Lipid Panel    Component Value Date/Time   CHOL 110 09/20/2017 0922   CHOL 108 01/05/2013 0748   TRIG 119 09/20/2017 0922   TRIG 84 01/05/2013 0748   HDL 33 (L) 09/20/2017 0922   HDL 29 (L) 01/05/2013 0748   CHOLHDL 3.3 09/20/2017 0922   CHOLHDL 3.7 08/30/2016 0840   VLDL 21 08/30/2016 0840   LDLCALC 53 09/20/2017 0922   LDLCALC 62 01/05/2013 0748      Wt Readings from Last 3 Encounters:  09/20/18 270 lb (122.5 kg)  03/30/18 261 lb 12.8 oz (118.8 kg)  11/07/17 278 lb (126.1 kg)      Other studies Reviewed: Additional studies/ records that were reviewed today include: . Review of the above records demonstrates:    ASSESSMENT AND PLAN:  1.  Coronary artery disease-   [place] is doing well.  Is not had any episodes of angina.  Continue current medications.  2. Hypertension -his blood pressure is well controlled.  Has been losing weight.  As he loses weight we may need to decrease his blood pressure medicine slightly.  Call if he has not more episodes of orthostatic hypotension.  3. Carotid artery disease -  .  Carotid duplex scan was stable.  Will repeat the carotid duplex in 1 year.  4. Hyperlipidemia -    continue atorvastatin.  Will check fasting lipids today.  5. 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  Paroxysmal atrial fibrillation - discovered after his stroke-  CHADS2VASC is   47 ( age, CAD, CVA, HTN, ) ,  Has maintained normal sinus rhythm.  7. Fatigue:     8. Iron deficiency:  .  Current medicines are reviewed at length with the patient today.  The patient does not have concerns regarding medicines.  The following  changes have been made:  no change  Labs/ tests ordered today include:   Orders Placed This Encounter  Procedures  . Lipid Profile  . Basic Metabolic Panel (BMET)  . Hepatic function panel  . EKG 12-Lead    Disposition:   FU with me in 1 year    Mertie Moores, MD  09/20/2018 12:33 PM    [address], [place], [place]  [address] Phone: [phone]; Fax: [phone]

## 2018-09-20 NOTE — Patient Instructions (Signed)
Medication Instructions:  Your physician recommends that you continue on your current medications as directed. Please refer to the Current Medication list given to you today.  If you need a refill on your cardiac medications before your next appointment, please call your pharmacy.    Lab work: TODAY - cholesterol, liver panel, basic metabolic panel  If you have labs (blood work) drawn today and your tests are completely normal, you will receive your results only by: Marland Kitchen MyChart Message (if you have MyChart) OR . A paper copy in the mail If you have any lab test that is abnormal or we need to change your treatment, we will call you to review the results.   Testing/Procedures: Carotid Ultrasound is planned for November 2020 You should receive a call to schedule this closer to that time. If not, please call our office at [phone] to schedule    Follow-Up: **Please call the office to report if BP runs low or if you notice dizziness/light-headedness  At [facility], you and your health needs are our priority.  As part of our continuing mission to provide you with exceptional heart care, we have created designated Provider Care Teams.  These Care Teams include your primary Cardiologist (physician) and Advanced Practice Providers (APPs -  Physician Assistants and Nurse Practitioners) who all work together to provide you with the care you need, when you need it. You will need a follow up appointment in:  1 years.  Please call our office 2 months in advance to schedule this appointment.  You may see Mertie Moores, MD or one of the following Advanced Practice Providers on your designated Care Team: Richardson Dopp, PA-C [place], [place] . Daune Perch, NP

## 2018-10-14 ENCOUNTER — Other Ambulatory Visit: Payer: Self-pay | Admitting: Cardiovascular Disease

## 2018-10-16 NOTE — Telephone Encounter (Signed)
Pt last saw Dr Acie Fredrickson 09/20/18, last labs 09/20/18 Creat 1.17, age 72, weight 122.5kg, CrCl 100.34 based on CrCl pt is on appropriate dosage of Xarelto 20mg  QD.  Will refill rx.

## 2018-11-30 ENCOUNTER — Telehealth: Payer: Self-pay | Admitting: *Deleted

## 2018-11-30 NOTE — Telephone Encounter (Signed)
   Primary Cardiologist: Mertie Moores, MD  Chart reviewed as part of pre-operative protocol coverage. Patient was contacted 11/30/2018 in reference to pre-operative risk assessment for pending surgery as outlined below.  Corey [name] was last seen on 09/21/19  by Dr. Acie Fredrickson.  Since that day, Corey [name] has done well w/o any anginal symptoms. He denies CP and dyspnea.   Therefore, based on ACC/AHA guidelines, the patient would be at acceptable risk for the planned procedure without further cardiovascular testing.   Per pharmacy, Recommend only holding Xarelto for 1 day prior to procedure due to history of CVA and low bleed risk procedure.  I will route this recommendation to the requesting party via Epic fax function and remove from pre-op pool.  Please call with questions.  Lyda Jester, PA-C 11/30/2018, 2:53 PM

## 2018-11-30 NOTE — Telephone Encounter (Signed)
   [facility] Pre-operative Risk Assessment    Request for surgical clearance:  1. What type of surgery is being performed? LEFT THUMB CARPOMETACARPAL ATHROPLASTY   2. When is this surgery scheduled? 12/22/18   3. What type of clearance is required (medical clearance vs. Pharmacy clearance to hold med vs. Both)? BOTH  4. Are there any medications that need to be held prior to surgery and how long?XARELTO HOLD 3 DAYS PRIOR  5. Practice name and name of physician performing surgery? [facility]; DR. Veronia Beets   6. What is your office phone number [phone]    7.   What is your office fax number [phone]  8.   Anesthesia type (None, local, MAC, general) ? GENERAL   Julaine Hua 11/30/2018, 11:53 AM  _________________________________________________________________   (provider comments below)

## 2018-11-30 NOTE — Telephone Encounter (Signed)
Needs phone call. LVM to call back for clearance.

## 2018-11-30 NOTE — Telephone Encounter (Signed)
Pt takes Xarelto for afib with CHADS2VASc score of 5 (age, HTN, CAD, CVA). Renal function is normal. Recommend only holding Xarelto for 1 day prior to procedure due to history of CVA and low bleed risk procedure.

## 2018-11-30 NOTE — Telephone Encounter (Signed)
Follow up  ° ° °Patient is returning your call. °

## 2019-02-02 HISTORY — PX: OTHER SURGICAL HISTORY: SHX169

## 2019-02-11 ENCOUNTER — Encounter: Payer: Self-pay | Admitting: Adult Health

## 2019-02-12 ENCOUNTER — Ambulatory Visit: Payer: Medicare Other | Admitting: Adult Health

## 2019-02-12 ENCOUNTER — Other Ambulatory Visit: Payer: Self-pay

## 2019-02-12 ENCOUNTER — Encounter: Payer: Self-pay | Admitting: Adult Health

## 2019-02-12 VITALS — BP 118/74 | HR 71 | Temp 98.0°F | Ht 73.0 in | Wt 270.6 lb

## 2019-02-12 DIAGNOSIS — G4733 Obstructive sleep apnea (adult) (pediatric): Secondary | ICD-10-CM | POA: Diagnosis not present

## 2019-02-12 NOTE — Patient Instructions (Addendum)
Continue with CPAP At bedtime   Order for new CPAP .  Change CPAP pressure to 5 to 13 cmH2O .  Change DME company .  Keep up good work Do not drive if sleepy.  Work on healthy weight .  Follow up Dr.Alva or Parrett NP in 1 year and As needed

## 2019-02-12 NOTE — Progress Notes (Signed)
@Patient  ID: Corey [name], male    DOB: Jan 20, 1946, 73 y.o.   MRN: [id number]  Chief Complaint  Patient presents with  . Follow-up    OSA    Referring provider: [name], MD  HPI: 73 yo male with severe OSA on CPAP since 2006. He has paroxysmal atrial fibrillation on Xarelto and history of CVA in 2013  TEST/EVENTS :  NPSG 2006: AHI 42/hr  02/12/2019 Follow up : OSA  Patient presents for a one-year follow-up for sleep apnea.  Patient says overall he is doing well on CPAP.  He never misses a night.  He feels rested with no significant daytime sleepiness.  Feels that he benefits from CPAP.  Has noticed recently that his pressures been a little excessive.  Feels like his mask gets too tight intermittently.  Also says his machine is very old greater than 11 years now.  And feels that it is not working  properly is requesting a new CPAP machine.Marland Kitchen also wants to change DME companies and complains of current DME customer service.  CPAP download shows excellent compliance with average daily usage at 7.5 hours.  Patient is on auto CPAP 5 to 15 cm H2O.  AHI 3.3.  Minimal leaks.  Daily average pressure at 13 cm H2O.  Says he has been very active with home projects .       Allergies  Allergen Reactions  . Codeine Nausea Only and Other (See Comments)    Heavy amounts cause nausea  . Monopril [Fosinopril Sodium] Other (See Comments)    Muscle aches & pain  . Ampicillin Rash  . Percocet [Oxycodone-Acetaminophen] Nausea And Vomiting    Immunization History  Administered Date(s) Administered  . Influenza Split 08/04/2013  . Influenza, High Dose Seasonal PF 09/02/2018  . Influenza-Unspecified 08/04/2014, 07/05/2015, 06/15/2016  . Pneumococcal Conjugate-13 12/02/2013, 07/04/2014    Past Medical History:  Diagnosis Date  . Arthritis    "back, right knee, hands, ankles, neck" (05/31/2016)  . Atrial fibrillation with RVR ([place])   . Carotid artery disease ([place]) 08/30/2016   Carotid  US 11/19: R 1-39, L 40-59, repeat 12 months  . Chronic lower back pain   . Coronary atherosclerosis of native coronary artery    a. BMS to [facility] 2004 and 2007, otherwise mild nonobstructive disease. EF normal.  . CVA (cerebral vascular accident) ([place]) 07/2012   AF discovered after stroke; denies residual on 12/04/2014  . Diverticulitis   . Dyslipidemia   . Essential hypertension, benign   . GERD (gastroesophageal reflux disease)   . Lumbar radiculopathy, chronic 02/04/2015   Right L5  . Obesity   . OSA on CPAP   . Paroxysmal atrial fibrillation ([place])    a. Discovered after stroke.  . Pneumonia 01/1996  . PONV (postoperative nausea and vomiting)   . Recurrent upper respiratory infection (URI)     Tobacco History: Social History   Tobacco Use  Smoking Status Former Smoker  . Packs/day: 1.00  . Years: 34.00  . Pack years: 34.00  . Types: Cigarettes  . Last attempt to quit: 05/05/2003  . Years since quitting: 15.7  Smokeless Tobacco Never Used   Counseling given: Not Answered   Outpatient Medications Prior to Visit  Medication Sig Dispense Refill  . acetaminophen (TYLENOL 8 HOUR ARTHRITIS PAIN) 650 MG CR tablet Take 1,300 mg by mouth 2 (two) times daily as needed for pain (pain).     Marland Kitchen ALPRAZolam (XANAX) 0.5 MG tablet Take 0.5 mg by  mouth at bedtime as needed.  1  . atorvastatin (LIPITOR) 40 MG tablet Take 1 tablet (40 mg total) by mouth daily. 90 tablet 3  . Diclofenac-Misoprostol 75-0.2 MG TBEC Take 1 tablet by mouth 2 (two) times daily after a meal. (Patient taking differently: Take 1 tablet by mouth 2 (two) times daily as needed. ) 60 tablet 3  . fenofibrate 160 MG tablet Take 1 tablet (160 mg total) by mouth daily. 90 tablet 3  . finasteride (PROSCAR) 5 MG tablet Take 5 mg by mouth daily.  11  . fluticasone (FLONASE) 50 MCG/ACT nasal spray Place 1 spray into the nose daily.    Marland Kitchen levocetirizine (XYZAL) 5 MG tablet Take 5 mg by mouth every evening.    Marland Kitchen lisinopril  (PRINIVIL,ZESTRIL) 10 MG tablet Take 1 tablet (10 mg total) by mouth daily. 90 tablet 3  . metoprolol succinate (TOPROL-XL) 50 MG 24 hr tablet Take 2 tablets (100 mg total) by mouth daily. Take with or immediately following a meal. 180 tablet 3  . Multiple Vitamins-Minerals (MULTIVITAMINS THER. W/MINERALS) TABS Take 1 tablet by mouth daily.      . nitroGLYCERIN (NITROSTAT) 0.4 MG SL tablet Place 1 tablet (0.4 mg total) under the tongue every 5 (five) minutes as needed for chest pain. 25 tablet 5  . omeprazole (PRILOSEC) 10 MG capsule Take 1 capsule (10 mg total) by mouth daily. 90 capsule 3  . Probiotic Product (PROBIOTIC PO) Take 1 capsule by mouth daily.     . sildenafil (REVATIO) 20 MG tablet Take 2 to 5 tabs daily as needed for erectile dysfunction 30 tablet 2  . Tamsulosin HCl (FLOMAX) 0.4 MG CAPS Take 0.4 mg by mouth daily.      Alveda Reasons 20 MG TABS tablet TAKE 1 TABLET BY MOUTH EVERY DAY 90 tablet 1   No facility-administered medications prior to visit.      Review of Systems:   Constitutional:   No  weight loss, night sweats,  Fevers, chills, fatigue, or  lassitude.  HEENT:   No headaches,  Difficulty swallowing,  Tooth/dental problems, or  Sore throat,                No sneezing, itching, ear ache, nasal congestion, post nasal drip,   CV:  No chest pain,  Orthopnea, PND, swelling in lower extremities, anasarca, dizziness, palpitations, syncope.   GI  No heartburn, indigestion, abdominal pain, nausea, vomiting, diarrhea, change in bowel habits, loss of appetite, bloody stools.   Resp: No shortness of breath with exertion or at rest.  No excess mucus, no productive cough,  No non-productive cough,  No coughing up of blood.  No change in color of mucus.  No wheezing.  No chest wall deformity  Skin: no rash or lesions.  GU: no dysuria, change in color of urine, no urgency or frequency.  No flank pain, no hematuria   MS:  No joint pain or swelling.  No decreased range of motion.   No back pain.    Physical Exam  BP 118/74 (BP Location: Left Arm, Cuff Size: Large)   Pulse 71   Temp 98 F (36.7 C) (Oral)   Ht 6\' 1"  (1.854 m)   Wt 270 lb 9.6 oz (122.7 kg)   SpO2 95%   BMI 35.70 kg/m   GEN: A/Ox3; pleasant , NAD, obese    HEENT:  [place]/AT,  EACs-clear, TMs-wnl, NOSE-clear, THROAT-clear, no lesions, no postnasal drip or exudate noted. Class 2-3 MP airway  NECK:  Supple w/ fair ROM; no JVD; normal carotid impulses w/o bruits; no thyromegaly or nodules palpated; no lymphadenopathy.    RESP  Clear  P & A; w/o, wheezes/ rales/ or rhonchi. no accessory muscle use, no dullness to percussion  CARD:  RRR, no m/r/g, no peripheral edema, pulses intact, no cyanosis or clubbing.  GI:   Soft & nt; nml bowel sounds; no organomegaly or masses detected.   Musco: Warm bil, no deformities or joint swelling noted.   Neuro: alert, no focal deficits noted.    Skin: Warm, no lesions or rashes    Lab Results:    BNP No results found for: BNP  ProBNP No results found for: PROBNP  Imaging: No results found.    No flowsheet data found.  No results found for: NITRICOXIDE      Assessment & Plan:   OSA (obstructive sleep apnea) Well controlled with excellent compliance on CPAP   Plan  Patient Instructions  Continue with CPAP At bedtime   Order for new CPAP .  Change CPAP pressure to 5 to 13 cmH2O .  Change DME company .  Keep up good work Do not drive if sleepy.  Work on healthy weight .  Follow up Dr.Alva or Parrett NP in 1 year and As needed        Morbid obesity ([place]) Wt loss      Rexene Edison, NP 02/12/2019

## 2019-02-12 NOTE — Assessment & Plan Note (Addendum)
Well controlled with excellent compliance on CPAP   Plan  Patient Instructions  Continue with CPAP At bedtime   Order for new CPAP .  Change CPAP pressure to 5 to 13 cmH2O .  Change DME company .  Keep up good work Do not drive if sleepy.  Work on healthy weight .  Follow up Dr.Alva or Parrett NP in 1 year and As needed

## 2019-02-12 NOTE — Assessment & Plan Note (Signed)
Wt loss  

## 2019-02-15 ENCOUNTER — Telehealth: Payer: Self-pay | Admitting: Cardiovascular Disease

## 2019-02-15 NOTE — Telephone Encounter (Signed)
New Message   Patient states his doctor's office sent over a request for surgical clearance and he wants to know if he has to come in for an appt or do they have enough information from his last appt to do the surgical clearance.

## 2019-02-15 NOTE — Telephone Encounter (Signed)
Spoke with patient who states his thumb surgery originally scheduled for 12/22/18 with Dr. Amedeo Plenty has been rescheduled for next week. He asks if he needs to come into the office prior to the surgery. He denies complaints of chest discomfort or SOB and states he has been doing a great deal of physical labor without difficulty during the stay-at-home order. I reviewed the advice regarding holding Xarelto x 1 day that was given by our pharmacist and attached to the surgical clearance for 3/20 with patient and he verbalized understanding. He thanked me for the call.

## 2019-02-16 ENCOUNTER — Telehealth: Payer: Self-pay | Admitting: Adult Health

## 2019-02-16 NOTE — Telephone Encounter (Signed)
See previous message

## 2019-02-16 NOTE — Telephone Encounter (Signed)
Faxed to [facility], [phone].

## 2019-02-16 NOTE — Telephone Encounter (Signed)
Will route to PCCs

## 2019-02-16 NOTE — Telephone Encounter (Signed)
Rec'd fax confirm.

## 2019-02-19 ENCOUNTER — Telehealth: Payer: Self-pay | Admitting: Adult Health

## 2019-02-19 DIAGNOSIS — G4733 Obstructive sleep apnea (adult) (pediatric): Secondary | ICD-10-CM

## 2019-02-19 NOTE — Telephone Encounter (Signed)
Order was placed after visit with TP 02/12/2019. Info from order and info from referrals tab has been posted below: Type Date User Summary Attachment  General 02/13/2019 8:49 AM Leonie Green, Rosalene Billings - -  Note   Confirmation received from [facility] at [place].        Type Date User Summary Attachment  General 02/12/2019 4:47 PM Ilona Sorrel - -  Note   Message sent to Kim/Lynn at [place] making them aware pt wants to switch to [place].  CC'd Amanda/Gilda at [employer].        Type Date User Summary Attachment  General 02/12/2019 4:42 PM Ilona Sorrel - -  Note   Pt is requesting [facility] over [facility] due to convenience.  Holding for signature but when sending message will make [facility] aware pt is requesting [place] office.  Called pt & told him he should call [facility] tomorrow as well & make them aware he is wanting to switch locations.        Type Date User Summary Attachment  Provider Comments 02/12/2019 3:56 PM Rinaldo Ratel, CMA Provider Comments -  Note   Patient has OSA or probable OSA (Yes or No): yes Is the patient currently using CPAP within the home? (Yes or No): yes - current machine is 72 years old.  Please replace. If yes to question 2, what is the current DME provider? [facility].  Patient would like to change to [place] d/t convenience. Are there any changes to the order/settings? (if yes, please specify) no If no to question 2, date of sleep study: 2006 Date of face-to-face encounter: 02/12/19  Settings: auto 5-15cm Signs and symptoms or probable OSA, mention all that apply (snoring) (morning headaches) (witnessed apneas) (choking) (gasping during sleep): witnessed apneas Resmed S/O *or* DreamStation air/auto with heated humidity.   Enroll in [place] / [employer] Provide patient with SD card when [employer] enrollment expires CPAP supplies needed: patient is requesting mask ResMed F30i, headgear, cushions, filters, climate control tubing and water chamber.  PT  WILL NEED APPOINTMENT IN OFFICE WITH DME FOR CPAP SET UP.         Attempted to call pt but unable to reach.left message for pt to return call.

## 2019-02-21 ENCOUNTER — Telehealth: Payer: Self-pay | Admitting: Adult Health

## 2019-02-21 NOTE — Telephone Encounter (Signed)
ATC pt, no answer. Left message for pt to call back.  

## 2019-02-21 NOTE — Telephone Encounter (Signed)
Pt is returning call. Cb is [phone].

## 2019-02-21 NOTE — Telephone Encounter (Signed)
Spoke with pt and he states [facility] did not receive the order for pressure change. According to the chart TP changed his pressure settings to 5 to 13 cm. I will send the order to [facility]. Pt advised on VM that I would send the order to [facility] today. Nothing further is needed.   [phone]  Patient Instructions by Melvenia Needles, NP at 02/12/2019 2:30 PM  Author: Melvenia Needles, NP Author Type: Nurse Practitioner Filed: 02/12/2019 5:36 PM  Note Status: Addendum Cosign: Cosign Not Required Encounter Date: 02/12/2019  Editor: Melvenia Needles, NP (Nurse Practitioner)  Prior Versions: 1. Parrett, Fonnie Mu, NP (Nurse Practitioner) at 02/12/2019 5:36 PM - Addendum   2. Parrett, Fonnie Mu, NP (Nurse Practitioner) at 02/12/2019 3:01 PM - Signed    Continue with CPAP At bedtime   Order for new CPAP .  Change CPAP pressure to 5 to 13 cmH2O .  Change DME company .  Keep up good work Do not drive if sleepy.  Work on healthy weight .  Follow up Dr.Alva or Parrett NP in 1 year and As needed

## 2019-02-21 NOTE — Telephone Encounter (Signed)
I spoke with pt on a previous message about this. They should be faxing this form. I will go to the fax machine to see if we have received anything. Will leave message in triage.

## 2019-02-27 NOTE — Telephone Encounter (Signed)
I have checked TP's in box up front and I do not see this document. Corey [name] - do you happen to have this or do we need to have it refaxed? Thanks.

## 2019-02-27 NOTE — Telephone Encounter (Signed)
Was able to locate surgical clearance request  Have placed this in TP's to-do folder for review

## 2019-03-02 NOTE — Telephone Encounter (Signed)
Jess, please advise if the surgical clearance has been taken care of for pt. Thanks!

## 2019-03-05 NOTE — Telephone Encounter (Signed)
Apologies - yes, this was signed by Royal Piedra NP and faxed back on 5.28.2020 Thank you

## 2019-04-05 ENCOUNTER — Other Ambulatory Visit: Payer: Self-pay | Admitting: Cardiovascular Disease

## 2019-04-05 NOTE — Telephone Encounter (Signed)
Xarelto 20mg  refill request received; pt is 73 yrs old, wt-122.7kg, Crea-1.17 on 09/20/2018, last seen by Dr. Acie Fredrickson on 09/20/2018, CrCl-100.62ml/min; will send in refill to requested pharmacy.

## 2019-06-06 ENCOUNTER — Telehealth: Payer: Self-pay

## 2019-06-06 NOTE — Telephone Encounter (Signed)
I called and left patient a message to review medications and get consent for telehealth visit with Dr. Acie Fredrickson on Friday.

## 2019-06-07 NOTE — Telephone Encounter (Signed)

## 2019-06-08 ENCOUNTER — Other Ambulatory Visit: Payer: Self-pay

## 2019-06-08 ENCOUNTER — Telehealth (INDEPENDENT_AMBULATORY_CARE_PROVIDER_SITE_OTHER): Payer: Medicare Other | Admitting: Cardiovascular Disease

## 2019-06-08 VITALS — BP 142/72 | HR 62 | Wt 275.0 lb

## 2019-06-08 DIAGNOSIS — I10 Essential (primary) hypertension: Secondary | ICD-10-CM

## 2019-06-08 DIAGNOSIS — I480 Paroxysmal atrial fibrillation: Secondary | ICD-10-CM

## 2019-06-08 DIAGNOSIS — I2510 Atherosclerotic heart disease of native coronary artery without angina pectoris: Secondary | ICD-10-CM

## 2019-06-08 DIAGNOSIS — G459 Transient cerebral ischemic attack, unspecified: Secondary | ICD-10-CM

## 2019-06-08 MED ORDER — POTASSIUM CHLORIDE ER 10 MEQ PO TBCR: 10.0000 meq | EXTENDED_RELEASE_TABLET | Freq: Every day | ORAL | 3 refills | Status: DC

## 2019-06-08 MED ORDER — HYDROCHLOROTHIAZIDE 25 MG PO TABS: 25.0000 mg | ORAL_TABLET | Freq: Every day | ORAL | 3 refills | Status: DC

## 2019-06-08 NOTE — Patient Instructions (Addendum)
Medication Instructions:  Your physician has recommended you make the following change in your medication:  START HCTZ (hydrochlorothiazide) 25 mg once daily in the morning START Kdur (potassium chloride) 10 mEq once daily in the morning  If you need a refill on your cardiac medications before your next appointment, please call your pharmacy.   Lab work: Your physician recommends that you return for lab work in: 3 weeks on Thursday Sept. 24 You may come in anytime that day between the hours of 7:30 am and 4:45 pm. If this date does not work for you, please call to reschedule. You do not have to fast for the lab work.    Testing/Procedures: None Ordered    Follow-Up: Your physician recommends that you return for a follow-up appointment with Dr. Acie Fredrickson on Tuesday Dec. 8 at 8:00 am Please call to reschedule at least 24 hours in advance if you are unable to keep this appointment.

## 2019-06-08 NOTE — Progress Notes (Signed)
Virtual Visit via Video Note   This visit type was conducted due to national recommendations for restrictions regarding the COVID-19 Pandemic (e.g. social distancing) in an effort to limit this patient's exposure and mitigate transmission in our community.  Due to his co-morbid illnesses, this patient is at least at moderate risk for complications without adequate follow up.  This format is felt to be most appropriate for this patient at this time.  All issues noted in this document were discussed and addressed.  A limited physical exam was performed with this format.  Please refer to the patient's chart for his consent to telehealth for [facility].   Date:  06/08/2019   ID:  Corey [name], DOB November 08, 1945, MRN [id number]  Patient Location: Home Provider Location: Home  PCP:  [name], MD  Cardiologist:  Mertie Moores, MD  Electrophysiologist:  None   Problem List: 1.  . Coronary artery disease-status post PTCA and stenting of his right coronary artery 2. Hypertension 3. Hyperlipidemia 4. 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 Atrial fibrillation - discovered after his stroke    Corey [name] is a 73 yo gentleman with a history of coronary artery disease. Status post PTCA and stenting of his right coronary artery. He also has a history of hypertension, and hyperlipidemia. 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  He had a stroke in Oct. He had some speech problems and loss of fine motor control of the right arm. TEE was negative for PFO or LAA thrombus. Event monitor showed no A-Fib. He has been on Plavix since his stents in  [name] 4, 2014:  [name] had an episode of atrial fib in January. He was started on Xarelto and has done well.  He has had some fatigue on occasion. He monitors his BP.   He has recovered from his stroke.   Feb. 13, 2015:  Corey [name] is doing ok. Still having touble losing weight. Has had a URI for the past month or so. No  CP, no palps. Has recovered well from his CVA. He is looking forward to being able to exercise more   December 23, 2014: Corey [name] is a 73 y.o. male who presents following recent hospitalization for rapid atrial fib. He presented to the hospital with syncope /presyncope.  He was found to have rapid atrial fibrillation.  Was started on IV diltiazem and converted by the next am. He has maintained NSR. Has gained some weight due to inactivity.    07/10/2015:  Corey [name] is doing well.   No CP , no dyspnea.  No HR irregularities.   Has had rapid atrial fib in March , 2016 . Is no Xarelto   [name]  3, 2017:  Corey [name] is seen back for CAD and HTN.and PAF Also has anemia and has been found to have low iron.  Has had a colonoscopy ~ 2 years ago.   Negative stool guiac.   Nov. 27, 2017: Corey [name] was admitted to the hospital in Sept. 2017 with diverticulitis. Doing well.   No CP or dyspnea.  Due for his carotid duplex - has mild Carotid artery disease.   September 20, 2017:   Corey [name] is seen today for follow-up visit. His twin brother was diagnosed with parkinsons recently . hes worried about that.   Going to see a neurologist.  Gets fatigued on occasion.    No angina .   Exercising some.   Has some back  issues.  Is trying to lose some weight .  Occasionally has some left leg swelling   Dec. 18, 2019 Corey [name] was seen today for follow-up of his coronary artery disease, PAF,  hypertension, hyperlipidemia. Has had some orthostasis  Has lost some weight .  Wt = 270 lbs today  Exercising more,   Needs left hand surgery in January .  Needs cardiac clearance  Is on Xarelto for PAF  - will hold for 2 days prior to hand surgery    Has stable carotid artery disease  Repeat carotid duplex is ordered for next year    Evaluation Performed:  Follow-Up Visit  Chief Complaint:  CAD   Sept. 4, 2020    Corey [name] is a 73 y.o. male with CAD, HTN, HLD;, AF with CVA   Wt is 41  Was at the beach last  week.   Was driving.   Lost orientation.  Knew he needed to pull over Took BP ,  Kardia ECG .  Normal waveform. HR was 83.   BP was 185/95  Sat and rested.   BP remained high.   Went to [employer].  Went to [place]. Head CT was negative  Glucose was 128 Blood work was fine  Had not been exercising as much .  Has a torn tendon in his foot.   Was started on a new antihistamine   Has PAF, had a stroke     The patient does not have symptoms concerning for COVID-19 infection (fever, chills, cough, or new shortness of breath).    Past Medical History:  Diagnosis Date  . Arthritis    "back, right knee, hands, ankles, neck" (05/31/2016)  . Atrial fibrillation with RVR ([place])   . Carotid artery disease ([place]) 08/30/2016   Carotid US 11/19: R 1-39, L 40-59, repeat 12 months  . Chronic lower back pain   . Coronary atherosclerosis of native coronary artery    a. BMS to [facility] 2004 and 2007, otherwise mild nonobstructive disease. EF normal.  . CVA (cerebral vascular accident) ([place]) 07/2012   AF discovered after stroke; denies residual on 12/04/2014  . Diverticulitis   . Dyslipidemia   . Essential hypertension, benign   . GERD (gastroesophageal reflux disease)   . Lumbar radiculopathy, chronic 02/04/2015   Right L5  . Obesity   . OSA on CPAP   . Paroxysmal atrial fibrillation ([place])    a. Discovered after stroke.  . Pneumonia 01/1996  . PONV (postoperative nausea and vomiting)   . Recurrent upper respiratory infection (URI)    Past Surgical History:  Procedure Laterality Date  . ANTERIOR CERVICAL DECOMP/DISCECTOMY FUSION  07/2001; 10/2002   "C5-6; C6-7; redo"  . BACK SURGERY    . CARPAL TUNNEL RELEASE Left 10/2015  . COLONOSCOPY W/ POLYPECTOMY  02/2014  . CORONARY ANGIOPLASTY WITH STENT PLACEMENT  05/2003; 12/2005   "mid RCA; mid RCA"  . JOINT REPLACEMENT    . KNEE ARTHROSCOPY Left 10/2005  . KNEE ARTHROSCOPY W/ PARTIAL MEDIAL MENISCECTOMY Left 09/2005  . LUMBAR  LAMINECTOMY/DECOMPRESSION MICRODISCECTOMY  03/2005   "L4-5"  . POSTERIOR LUMBAR FUSION  10/2003   L5-S1; "plates, screws"  . SHOULDER ARTHROSCOPY Right 08/2011   Debridement of labrum, arthroscopic distal clavicle excision  . SHOULDER OPEN ROTATOR CUFF REPAIR Left 07/2014  . TEE WITHOUT CARDIOVERSION  07/07/2012   Procedure: TRANSESOPHAGEAL ECHOCARDIOGRAM (TEE);  Surgeon: Fay Records, MD;  Location: [place];  Service: Cardiovascular;  Laterality: N/A;  . TONSILLECTOMY AND ADENOIDECTOMY  ~ 1956  . TOTAL KNEE ARTHROPLASTY Left 10/2006  . TRIGGER FINGER RELEASE Left 10/2015     No outpatient medications have been marked as taking for the 06/08/19 encounter (Telemedicine) with Liyla Radliff, Wonda Cheng, MD.     Allergies:   Codeine, Monopril [fosinopril sodium], Ampicillin, and Percocet [oxycodone-acetaminophen]   Social History   Tobacco Use  . Smoking status: Former Smoker    Packs/day: 1.00    Years: 34.00    Pack years: 34.00    Types: Cigarettes    Quit date: 05/05/2003    Years since quitting: 16.1  . Smokeless tobacco: Never Used  Substance Use Topics  . Alcohol use: No    Alcohol/week: 0.0 standard drinks  . Drug use: No     Family Hx: The patient's family history includes Heart attack in his father; Heart disease in his father; Hypertension in his mother.  ROS:   Please see the history of present illness.     All other systems reviewed and are negative.   Prior CV studies:   The following studies were reviewed today:    Labs/Other Tests and Data Reviewed:    EKG:  No ECG reviewed.  Recent Labs: 09/20/2018: ALT 37; BUN 23; Creatinine, Ser 1.17; Potassium 4.9; Sodium 136   Recent Lipid Panel Lab Results  Component Value Date/Time   CHOL 129 09/20/2018 10:04 AM   CHOL 108 01/05/2013 07:48 AM   TRIG 81 09/20/2018 10:04 AM   TRIG 84 01/05/2013 07:48 AM   HDL 38 (L) 09/20/2018 10:04 AM   HDL 29 (L) 01/05/2013 07:48 AM   CHOLHDL 3.4 09/20/2018 10:04 AM   CHOLHDL  3.7 08/30/2016 08:40 AM   LDLCALC 75 09/20/2018 10:04 AM   LDLCALC 62 01/05/2013 07:48 AM    Wt Readings from Last 3 Encounters:  02/12/19 270 lb 9.6 oz (122.7 kg)  09/20/18 270 lb (122.5 kg)  03/30/18 261 lb 12.8 oz (118.8 kg)     Objective:    Vital Signs:  BP (!) 142/72 (BP Location: Right Arm, Patient Position: Sitting, Cuff Size: Normal)   Pulse 62    VITAL SIGNS:  reviewed GEN:  no acute distress EYES:  sclerae anicteric, EOMI - Extraocular Movements Intact RESPIRATORY:  normal respiratory effort, symmetric expansion CARDIOVASCULAR:  no peripheral edema SKIN:  no rash, lesions or ulcers. MUSCULOSKELETAL:  no obvious deformities. NEURO:  alert and oriented x 3, no obvious focal deficit PSYCH:  normal affect  ASSESSMENT & PLAN:    1. CAD: Franck has done well from a cardiac standpoint.  He is not had any episodes of chest pain  2.  Hypertension: His blood pressure has been high recently.  I suspect this has to do with eating more salt and also weight gain.  I encouraged him to continue to work very hard on a diet, exercise, weight loss program.  We will add hydrochlorothiazide 25 mg a day and potassium chloride 10 mg a day.  [facility] [address].  3.  Paroxysmal atrial fibrillation: He remains in sinus rhythm.  Continue xarelto  4.  Recent TIA symptoms: Reiley has a history of a stroke in the past.  He recently had an episode which is probably a TIA.  He was down at the beach last week and had acute visual changes and some disorientation.  His blood pressure was markedly elevated when  he measured it.  He went to the local emergency room and the work-up there was negative.  He had a head CT which was negative.  Glucose was normal.  I will  contact his primary medical doctor.  I think that Murice probably needs a referral to neurology.  He might need a brain MRI.  COVID-19 Education: The signs and symptoms of COVID-19 were  discussed with the patient and how to seek care for testing (follow up with PCP or arrange E-visit).  The importance of social distancing was discussed today.  Time:   Today, I have spent  25  minutes with the patient with telehealth technology discussing the above problems.     Medication Adjustments/Labs and Tests Ordered: Current medicines are reviewed at length with the patient today.  Concerns regarding medicines are outlined above.   Tests Ordered: No orders of the defined types were placed in this encounter.   Medication Changes: No orders of the defined types were placed in this encounter.   Follow Up:  In Person in 3 month(s)  Signed, Mertie Moores, MD  06/08/2019 10:02 AM    [place]

## 2019-06-21 ENCOUNTER — Ambulatory Visit: Payer: Medicare Other | Admitting: Neurology

## 2019-06-25 ENCOUNTER — Other Ambulatory Visit: Payer: Self-pay

## 2019-06-25 ENCOUNTER — Telehealth: Payer: Self-pay

## 2019-06-25 ENCOUNTER — Encounter: Payer: Self-pay | Admitting: Neurology

## 2019-06-25 ENCOUNTER — Ambulatory Visit (INDEPENDENT_AMBULATORY_CARE_PROVIDER_SITE_OTHER): Payer: Medicare Other | Admitting: Neurology

## 2019-06-25 ENCOUNTER — Telehealth: Payer: Self-pay | Admitting: Cardiovascular Disease

## 2019-06-25 ENCOUNTER — Other Ambulatory Visit: Payer: Self-pay | Admitting: Cardiovascular Disease

## 2019-06-25 VITALS — BP 142/81 | HR 70 | Temp 97.1°F | Wt 277.0 lb

## 2019-06-25 DIAGNOSIS — G250 Essential tremor: Secondary | ICD-10-CM | POA: Insufficient documentation

## 2019-06-25 DIAGNOSIS — G459 Transient cerebral ischemic attack, unspecified: Secondary | ICD-10-CM | POA: Diagnosis not present

## 2019-06-25 MED ORDER — TOPIRAMATE 50 MG PO TABS: 50.0000 mg | ORAL_TABLET | Freq: Two times a day (BID) | ORAL | 2 refills | Status: DC

## 2019-06-25 MED ORDER — ATORVASTATIN CALCIUM 40 MG PO TABS: 40.0000 mg | ORAL_TABLET | Freq: Every day | ORAL | 3 refills | Status: DC

## 2019-06-25 NOTE — Telephone Encounter (Signed)
[facility] stating [facility] no longer covers atorvastatin. Would Dr. Acie Fredrickson like to prescribe something else? Please address

## 2019-06-25 NOTE — Telephone Encounter (Signed)
New Message:   Pt wants to know if he needs lab work on 06-28-19? He said he just saw Dr Acie Fredrickson on 06-08-19.

## 2019-06-25 NOTE — Telephone Encounter (Signed)
[facility], was told by pharmacist that Atorvastatin is covered by insurance and 3 month refill ready to be picked up for $20 copay. Informed patient, he will let us know if he encounters any further difficulties with insurance coverage on this medication.

## 2019-06-25 NOTE — Patient Instructions (Signed)
I had a long discussion with the patient regarding his episode of presyncopal symptoms with elevated blood pressure and slight headache being of unclear significance possibly TIA versus presyncopal event.  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  I recommend he continue Xarelto for stroke prevention given history of atrial fibrillation and remote stroke in 2013 and maintain strict control of hypertension with blood pressure goal below 130/90, lipids with LDL cholesterol goal below 70 mg percent and diabetes with hemoglobin A1c goal below 6.5%.  He was advised to keep his appointment next month for screening carotid ultrasound at his cardiologist office and lab work for lipid profile and hemoglobin globin A1c next week with his primary care physician.  He was also counseled to use his CPAP every night and to eat a healthy diet and to exercise and lose weight.  Trial of Topamax 50 mg twice daily for his essential tremor.  He has previously not been able to tolerate primidone and Inderal.  He will return for follow-up in 3 months with my nurse practitioner Janett Billow or call earlier if necessary.

## 2019-06-25 NOTE — Progress Notes (Signed)
[name] [address] [place]. [place] [address] [phone]       OFFICE CONSULT NOTE  [name] Date of Birth:  06/08/1946 Medical Record Number:  [id number]   Referring MD: Doreatha Massed  Reason for Referral: TIA HPI: Corey [name] is a 73 year old Caucasian male with past medical history of hypertension, hyperlipidemia, coronary artery disease status post PTCA stenting, history of left MCA embolic infarct in 9937 due to atrial fibrillation, obstructive sleep apnea, arthritis, gastroesophageal reflux disease, remote smoking and obesity who is referred for evaluation for an episode of possible TIA.  History is obtained from the patient, review of electronic medical records and I personally reviewed imaging films in PACS.  Patient states that he was driving in [facility] on 06/02/2019 when all of a sudden he did not feel well.  He felt that he may pass out vision was getting blurred he was having a mild headache.  He managed to pull over the car and ask wife to take over.  He had a portable EKG monitor which showed that his pulse rate was 73 and rhythm was okay.  But his blood pressure was elevated at 195 95.  He waited about 5 minutes and retook the blood pressure it was still high at 185/95.  They decided to go over to the local ER where he had further work-up including checking blood sugar which was 128 mg percent.  Patient at that time was also feeling cold and clammy and sweaty.  He was evaluated at [facility] where CT scan of the head as well as basic lab work were obtained which were all normal.  Patient states that he had somewhat similar episode before he had his previous stroke in 2013.  He has atrial fibrillation and has been taking Xarelto quite faithfully with his dinner every night without fail.  He states he had last lipid profile and A1c checked in December 2019 by his primary physician and they were satisfactory and he is plans to  see him next week to have them repeated.  He does use his CPAP every night regularly.  He is currently on a low-carb diet and wants to lose some weight.  He did have a recent tele-visit with his cardiologist who added hydrochlorothiazide for blood pressure control and his blood pressure is doing better now running 169-678 systolic.  The patient has had longstanding history of tremors in his left hand probably 3 years or longer.  He is was diagnosed with essential tremor in fact was referred by me to Dr. Rexene Alberts on 11/07/2017 for second opinion who also felt it was essential tremor rather than Parkinson's.  Patient did not tolerate primidone well and it made him feel loopy and stopped it.  He also complained of a lot of fatigue and tiredness on Inderal which was discontinued.  He states that he is noticed increasing tremor as well as some drooling.  His brother was recently diagnosed with atypical Parkinson's and patient wonders if he has the same.  ROS:   14 system review of systems is positive for tremors dizziness, blurred vision, headache and all other systems negative  PMH:  Past Medical History:  Diagnosis Date   Arthritis    "back, right knee, hands, ankles, neck" (05/31/2016)   Atrial fibrillation with RVR ([place])    Carotid artery disease ([place]) 08/30/2016   Carotid US 11/19: R 1-39, L 40-59, repeat 12 months   Chronic lower back pain  Coronary atherosclerosis of native coronary artery    a. BMS to [facility] 2004 and 2007, otherwise mild nonobstructive disease. EF normal.   Diverticulitis    Dyslipidemia    Essential hypertension, benign    GERD (gastroesophageal reflux disease)    Lumbar radiculopathy, chronic 02/04/2015   Right L5   Obesity    OSA on CPAP    Paroxysmal atrial fibrillation (HCC)    a. Discovered after stroke.   Pneumonia 01/1996   PONV (postoperative nausea and vomiting)    Recurrent upper respiratory infection (URI)     Social History:  Social History    Socioeconomic History   Marital status: Married    Spouse name: Renee   Number of children: 3   Years of education: 14   Highest education level: Not on file  Occupational History   Occupation: Retired    Fish farm manager: DISABLED     Comment: Works at [employer] part time  [place]: Not on file   Food insecurity    Worry: Not on file    Inability: Not on Lexicographer needs    Medical: Not on file    Non-medical: Not on file  Tobacco Use   Smoking status: Former Smoker    Packs/day: 1.00    Years: 34.00    Pack years: 34.00    Types: Cigarettes    Quit date: 05/05/2003    Years since quitting: 16.1   Smokeless tobacco: Never Used  Substance and Sexual Activity   Alcohol use: No    Alcohol/week: 0.0 standard drinks   Drug use: No   Sexual activity: Yes  Lifestyle   Physical activity    Days per week: Not on file    Minutes per session: Not on file   Stress: Not on file  Relationships   Social connections    Talks on phone: Not on file    Gets together: Not on file    Attends religious service: Not on file    Active member of club or organization: Not on file    Attends meetings of clubs or organizations: Not on file    Relationship status: Not on file   Intimate partner violence    Fear of current or ex partner: Not on file    Emotionally abused: Not on file    Physically abused: Not on file    Forced sexual activity: Not on file  Other Topics Concern   Not on file  Social History Narrative   Patient lives at home with spouse.   Caffeine Use:     Medications:   Current Outpatient Medications on File Prior to Visit  Medication Sig Dispense Refill   acetaminophen (TYLENOL 8 HOUR ARTHRITIS PAIN) 650 MG CR tablet Take 1,300 mg by mouth 2 (two) times daily as needed for pain (pain).      ALPRAZolam (XANAX) 0.5 MG tablet Take 0.5 mg by mouth at bedtime as needed.  1   atorvastatin (LIPITOR) 40 MG tablet  Take 1 tablet (40 mg total) by mouth daily. 90 tablet 3   fenofibrate 160 MG tablet Take 1 tablet (160 mg total) by mouth daily. 90 tablet 3   finasteride (PROSCAR) 5 MG tablet Take 5 mg by mouth daily.  11   fluticasone (FLONASE) 50 MCG/ACT nasal spray Place 1 spray into the nose daily.     hydrochlorothiazide (HYDRODIURIL) 25 MG tablet Take 1 tablet (25 mg total) by mouth daily. [place]rica  tablet 3   levocetirizine (XYZAL) 5 MG tablet Take 5 mg by mouth every evening.     lisinopril (PRINIVIL,ZESTRIL) 10 MG tablet Take 1 tablet (10 mg total) by mouth daily. 90 tablet 3   metoprolol succinate (TOPROL-XL) 50 MG 24 hr tablet Take 2 tablets (100 mg total) by mouth daily. Take with or immediately following a meal. 180 tablet 3   Multiple Vitamins-Minerals (MULTIVITAMINS THER. W/MINERALS) TABS Take 1 tablet by mouth daily.       nitroGLYCERIN (NITROSTAT) 0.4 MG SL tablet Place 1 tablet (0.4 mg total) under the tongue every 5 (five) minutes as needed for chest pain. 25 tablet 5   omeprazole (PRILOSEC) 10 MG capsule Take 1 capsule (10 mg total) by mouth daily. 90 capsule 3   potassium chloride (K-DUR) 10 MEQ tablet Take 1 tablet (10 mEq total) by mouth daily. 90 tablet 3   Probiotic Product (PROBIOTIC PO) Take 1 capsule by mouth daily.      sildenafil (REVATIO) 20 MG tablet Take 2 to 5 tabs daily as needed for erectile dysfunction 30 tablet 2   Tamsulosin HCl (FLOMAX) 0.4 MG CAPS Take 0.4 mg by mouth daily.       XARELTO 20 MG TABS tablet TAKE 1 TABLET BY MOUTH EVERY DAY 90 tablet 1   No current facility-administered medications on file prior to visit.     Allergies:   Allergies  Allergen Reactions   Codeine Nausea Only and Other (See Comments)    Heavy amounts cause nausea   Monopril [Fosinopril Sodium] Other (See Comments)    Muscle aches & pain   Dilaudid [Hydromorphone Hcl] Nausea Only   Ampicillin Rash   Percocet [Oxycodone-Acetaminophen] Nausea And Vomiting    Physical  Exam General: well developed, well nourished, seated, in no evident distress Head: head normocephalic and atraumatic.   Neck: supple with no carotid or supraclavicular bruits Cardiovascular: regular rate and rhythm, no murmurs Musculoskeletal: no deformity Skin:  no rash/petichiae Vascular:  Normal pulses all extremities  Neurologic Exam Mental Status: Awake and fully alert. Oriented to place and time. Recent and remote memory intact. Attention span, concentration and fund of knowledge appropriate. Mood and affect appropriate.  Glabellar tap is negative Cranial Nerves: Fundoscopic exam reveals sharp disc margins. Pupils equal, briskly reactive to light. Extraocular movements full without nystagmus. Visual fields full to confrontation. Hearing intact. Facial sensation intact. Face, tongue, palate moves normally and symmetrically.  Motor: Normal bulk and tone. Normal strength in all tested extremity muscles.  Diminished fine finger movements on the right.  Orbits left over right upper extremity.  Mild action tremor in the left upper extremity which worsens with activity.  Mild cogwheel rigidity at the left wrist.  Glabellar tap is negative.  No decreased facial expression or bradykinesia. Sensory.: intact to touch , pinprick , position and vibratory sensation.  Coordination: Rapid alternating movements normal in all extremities. Finger-to-nose and heel-to-shin performed accurately bilaterally. Gait and Station: Arises from chair without difficulty. Stance is normal. Gait demonstrates normal stride length and balance . Able to heel, toe and tandem walk without difficulty.  No gait festination or stooped posture.  Intact postural response to threat.  No retropulsion. Reflexes: 1+ and symmetric. Toes downgoing.   NIHSS 0 Modified Rankin  1   ASSESSMENT: 73 year old Caucasian male with transient episode of presyncopal symptoms headache possible TIA in August 2020.  Patient had somewhat similar  initial symptoms in October 2013 followed by speech difficulty and right-sided weakness and left  MCA branch infarct from atrial fibrillation.  Vascular risk factors of obesity, hypertension, hyperlipidemia, atrial fibrillation and cardiac disease.  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   He is worried about Parkinson's disease given recent diagnosis and his brother of the same.     PLAN: I had a long discussion with the patient regarding his episode of presyncopal symptoms with elevated blood pressure and slight headache being of unclear significance possibly TIA versus presyncopal event.  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  I recommend he continue Xarelto for stroke prevention given history of atrial fibrillation and remote stroke in 2013 and maintain strict control of hypertension with blood pressure goal below 130/90, lipids with LDL cholesterol goal below 70 mg percent and diabetes with hemoglobin A1c goal below 6.5%.  He was advised to keep his appointment next month for screening carotid ultrasound at his cardiologist office and lab work for lipid profile and hemoglobin globin A1c next week with his primary care physician.  He was also counseled to use his CPAP every night and to eat a healthy diet and to exercise and lose weight.  Trial of Topamax 50 mg twice daily for his essential tremor.  He has previously not been able to tolerate primidone and Inderal.  Greater than 50% time during this 45-minute consultation visit was spent on counseling and coordination of care about his episode of possible TIA and discussion about stroke prevention and treatment of his essential tremor and answering questions.  He will return for follow-up in 3 months with my nurse practitioner Janett Billow or call earlier if  necessary. Antony Contras, MD  [facility] [address] [place], [place] [address]  Phone [phone] Fax [phone] Note: This document was prepared with digital dictation and possible smart phrase technology. Any transcriptional errors that result from this process are unintentional.

## 2019-06-25 NOTE — Telephone Encounter (Signed)
IF patient calls back please schedule him with Janett Billow NP per Dr.Sethi note.  VM was left for patient to call back to r/s with Janett Billow NP in 3 months.

## 2019-06-25 NOTE — Telephone Encounter (Signed)
Called pt's pharmacy and pharmacist stated that he did not know why pt's insurance rejected covering that medication the first time, but pt's insurance is covering Atorvastatin now. I resend pt's Rx for Atorvastatin to pt's pharmacy. Confirmation received.

## 2019-06-25 NOTE — Addendum Note (Signed)
Addended by: Derl Barrow on: 06/25/2019 01:17 PM   Modules accepted: Orders

## 2019-06-25 NOTE — Telephone Encounter (Signed)
Called and made patient aware that he needs to keep lab appt on 9/24 to assess kidney function and potassium with recetn start of HCTZ and k-dur. Patient verbalized understanding and thanked me for the call.

## 2019-06-25 NOTE — Telephone Encounter (Signed)
Corey [name],  Will you call the pharmacy and ask if rosuvastatin is covered.  Atorvastatin is generic so it should be very inexpensive. If Rosuvastatin is not covered, will you please get the list of what else is covered Thank you

## 2019-06-26 NOTE — Telephone Encounter (Signed)
Pt schedule with Janett Billow NP for 3 month follow up.

## 2019-06-26 NOTE — Telephone Encounter (Signed)
IF patient calls back please schedule him with Janett Billow NP per DR .SEthi in 3 months.  VM was left for patient to call back about scheduling a follow up appt.

## 2019-06-28 ENCOUNTER — Other Ambulatory Visit: Payer: Medicare Other | Admitting: *Deleted

## 2019-06-28 ENCOUNTER — Other Ambulatory Visit: Payer: Self-pay

## 2019-06-28 DIAGNOSIS — I2510 Atherosclerotic heart disease of native coronary artery without angina pectoris: Secondary | ICD-10-CM

## 2019-06-28 DIAGNOSIS — I480 Paroxysmal atrial fibrillation: Secondary | ICD-10-CM

## 2019-06-28 DIAGNOSIS — I10 Essential (primary) hypertension: Secondary | ICD-10-CM

## 2019-06-28 LAB — BASIC METABOLIC PANEL
BUN/Creatinine Ratio: 24 (ref 10–24)
BUN: 32 mg/dL — ABNORMAL HIGH (ref 8–27)
CO2: 21 mmol/L (ref 20–29)
Calcium: 9.6 mg/dL (ref 8.6–10.2)
Chloride: 102 mmol/L (ref 96–106)
Creatinine, Ser: 1.34 mg/dL — ABNORMAL HIGH (ref 0.76–1.27)
GFR calc Af Amer: 61 mL/min/{1.73_m2} (ref >59)
GFR calc non Af Amer: 53 mL/min/{1.73_m2} — ABNORMAL LOW (ref >59)
Glucose: 109 mg/dL — ABNORMAL HIGH (ref 65–99)
Potassium: 4.9 mmol/L (ref 3.5–5.2)
Sodium: 138 mmol/L (ref 134–144)

## 2019-07-06 ENCOUNTER — Emergency Department (HOSPITAL_COMMUNITY): Payer: Medicare Other

## 2019-07-06 ENCOUNTER — Encounter (HOSPITAL_COMMUNITY): Payer: Self-pay

## 2019-07-06 ENCOUNTER — Other Ambulatory Visit: Payer: Self-pay

## 2019-07-06 ENCOUNTER — Emergency Department (HOSPITAL_COMMUNITY)
Admission: EM | Admit: 2019-07-06 | Discharge: 2019-07-07 | Disposition: A | Discharge status: Home / Self Care | Payer: Medicare Other | Attending: Emergency Medicine | Admitting: Emergency Medicine

## 2019-07-06 DIAGNOSIS — E860 Dehydration: Secondary | ICD-10-CM

## 2019-07-06 DIAGNOSIS — I2510 Atherosclerotic heart disease of native coronary artery without angina pectoris: Secondary | ICD-10-CM | POA: Diagnosis not present

## 2019-07-06 DIAGNOSIS — Z79899 Other long term (current) drug therapy: Secondary | ICD-10-CM | POA: Insufficient documentation

## 2019-07-06 DIAGNOSIS — Z87891 Personal history of nicotine dependence: Secondary | ICD-10-CM | POA: Diagnosis not present

## 2019-07-06 DIAGNOSIS — Z7901 Long term (current) use of anticoagulants: Secondary | ICD-10-CM | POA: Insufficient documentation

## 2019-07-06 DIAGNOSIS — I4891 Unspecified atrial fibrillation: Secondary | ICD-10-CM | POA: Insufficient documentation

## 2019-07-06 LAB — CBC
HCT: 43 % (ref 39.0–52.0)
Hemoglobin: 14.3 g/dL (ref 13.0–17.0)
MCH: 31 pg (ref 26.0–34.0)
MCHC: 33.3 g/dL (ref 30.0–36.0)
MCV: 93.1 fL (ref 80.0–100.0)
Platelets: 282 10*3/uL (ref 150–400)
RBC: 4.62 MIL/uL (ref 4.22–5.81)
RDW: 13.8 % (ref 11.5–15.5)
WBC: 7.8 10*3/uL (ref 4.0–10.5)
nRBC: 0 % (ref 0.0–0.2)

## 2019-07-06 LAB — BASIC METABOLIC PANEL
Anion gap: 10 (ref 5–15)
BUN: 33 mg/dL — ABNORMAL HIGH (ref 8–23)
CO2: 20 mmol/L — ABNORMAL LOW (ref 22–32)
Calcium: 9.3 mg/dL (ref 8.9–10.3)
Chloride: 106 mmol/L (ref 98–111)
Creatinine, Ser: 1.48 mg/dL — ABNORMAL HIGH (ref 0.61–1.24)
GFR calc Af Amer: 54 mL/min — ABNORMAL LOW (ref >60)
GFR calc non Af Amer: 47 mL/min — ABNORMAL LOW (ref >60)
Glucose, Bld: 103 mg/dL — ABNORMAL HIGH (ref 70–99)
Potassium: 4 mmol/L (ref 3.5–5.1)
Sodium: 136 mmol/L (ref 135–145)

## 2019-07-06 MED ORDER — SODIUM CHLORIDE 0.9 % IV BOLUS
1000.0000 mL | Freq: Once | INTRAVENOUS | Status: AC
  Administered 2019-07-06: 1000 mL via INTRAVENOUS

## 2019-07-06 MED ORDER — SODIUM CHLORIDE 0.9% FLUSH: 3.0000 mL | Freq: Once | INTRAVENOUS | Status: DC

## 2019-07-06 NOTE — Discharge Instructions (Addendum)
Continue taking home medications as prescribed.  Make sure you are staying well-hydrated water. Follow-up with your cardiologist for further evaluation. Return to the emergency room with any new, worsening, concerning symptoms.

## 2019-07-06 NOTE — ED Triage Notes (Signed)
Pt bib [facility] for a-fib rvr. Pt called ems d/t chest pressure however that has since resolved. Pt HR 90-160 with ems. SPO2 95% RA with initial BP 180/80 however dropped to 120/72 after lopressor. Pt received 15mg  lopressor and 324mg  asprin. Pt states he takes xeralto.

## 2019-07-06 NOTE — ED Provider Notes (Signed)
[facility] EMERGENCY DEPARTMENT Provider Note   CSN: [id number] Arrival date & time: 07/06/19  2138     History   Chief Complaint Chief Complaint  Patient presents with   Atrial Fibrillation    HPI Corey [name] is a 73 y.o. male presented for evaluation of A. fib.  Patient states around 46 tonight he had acute onset feeling of palpitations and chest pressure.  This is usually when he goes into A. fib.  Patient states his chest pressure resolved, however he still feels he is in A. fib.  He denies dizziness or lightheadedness.  He denies fevers, chills, chest pain, cough, shortness of breath, nausea, vomiting, abdominal pain, urinary symptoms, abnormal bowel movements.  Patient states he was started on potassium and HCTZ 1 month ago, no other new medications.  He is on Xarelto for his paroxysmal A. fib, has been taking it as prescribed without any missed doses.  Patient states he has a history of A. fib which usually responds well to medication.  He tried Valsalva maneuver at home, and was given 15 mg of Lopressor with EMS with improvement of his rate.     HPI  Past Medical History:  Diagnosis Date   Arthritis    "back, right knee, hands, ankles, neck" (05/31/2016)   Atrial fibrillation with RVR ([place])    Carotid artery disease ([place]) 08/30/2016   Carotid US 11/19: R 1-39, L 40-59, repeat 12 months   Chronic lower back pain    Coronary atherosclerosis of native coronary artery    a. BMS to [facility] 2004 and 2007, otherwise mild nonobstructive disease. EF normal.   Diverticulitis    Dyslipidemia    Essential hypertension, benign    GERD (gastroesophageal reflux disease)    Lumbar radiculopathy, chronic 02/04/2015   Right L5   Obesity    OSA on CPAP    Paroxysmal atrial fibrillation (HCC)    a. Discovered after stroke.   Pneumonia 01/1996   PONV (postoperative nausea and vomiting)    Recurrent upper respiratory infection (URI)     Patient Active  Problem List   Diagnosis Date Noted   Essential tremor 06/25/2019   Chest pain 05/27/2017   Demand ischemia ([place]) 05/27/2017   Morbid obesity ([place]) 05/27/2017   PAF (paroxysmal atrial fibrillation) ([place]) 08/30/2016   Carotid artery disease ([place]) 08/30/2016   Essential hypertension 08/30/2016   Diverticulitis of intestine without perforation or abscess without bleeding    Hyponatremia 06/04/2016   Diverticulitis 05/31/2016   Iron deficiency 01/05/2016   Lumbar radiculopathy, chronic 02/04/2015   SVT (supraventricular tachycardia) ([place]) 12/03/2014   PAC (premature atrial contraction) 04/05/2014   OSA (obstructive sleep apnea) 03/27/2014   Atrial fibrillation ([place]) 01/05/2013   CVA (cerebral infarction) 07/06/2012   Coronary artery disease    Dyslipidemia    GERD (gastroesophageal reflux disease)     Past Surgical History:  Procedure Laterality Date   ANTERIOR CERVICAL DECOMP/DISCECTOMY FUSION  07/2001; 10/2002   "C5-6; C6-7; redo"   BACK SURGERY     CARPAL TUNNEL RELEASE Left 10/2015   COLONOSCOPY W/ POLYPECTOMY  02/2014   CORONARY ANGIOPLASTY WITH STENT PLACEMENT  05/2003; 12/2005   "mid RCA; mid RCA"   JOINT REPLACEMENT     KNEE ARTHROSCOPY Left 10/2005   KNEE ARTHROSCOPY W/ PARTIAL MEDIAL MENISCECTOMY Left 09/2005   LUMBAR LAMINECTOMY/DECOMPRESSION MICRODISCECTOMY  03/2005   "L4-5"   POSTERIOR LUMBAR FUSION  10/2003   L5-S1; "plates, screws"   SHOULDER ARTHROSCOPY Right  08/2011   Debridement of labrum, arthroscopic distal clavicle excision   SHOULDER OPEN ROTATOR CUFF REPAIR Left 07/2014   TEE WITHOUT CARDIOVERSION  07/07/2012   Procedure: TRANSESOPHAGEAL ECHOCARDIOGRAM (TEE);  Surgeon: Fay Records, MD;  Location: [facility];  Service: Cardiovascular;  Laterality: N/A;   TONSILLECTOMY AND ADENOIDECTOMY  ~ [place] Left 10/2006   TRIGGER FINGER RELEASE Left 10/2015        Home Medications    Prior to Admission  medications   Medication Sig Start Date End Date Taking? Authorizing Provider  acetaminophen (TYLENOL 8 HOUR ARTHRITIS PAIN) 650 MG CR tablet Take 1,300 mg by mouth 2 (two) times daily as needed for pain (pain).     [provider]  ALPRAZolam Duanne Moron) 0.5 MG tablet Take 0.5 mg by mouth at bedtime as needed. 09/20/17   [provider]  atorvastatin (LIPITOR) 40 MG tablet Take 1 tablet (40 mg total) by mouth daily. 06/25/19   Nahser, Wonda Cheng, MD  fenofibrate 160 MG tablet Take 1 tablet (160 mg total) by mouth daily. 10/16/18   Nahser, Wonda Cheng, MD  finasteride (PROSCAR) 5 MG tablet Take 5 mg by mouth daily. 02/10/17   [provider]  finasteride (PROSCAR) 5 MG tablet finasteride  5 mg tabs    [provider]  fluticasone (FLONASE) 50 MCG/ACT nasal spray Place 1 spray into the nose daily.    [provider]  hydrochlorothiazide (HYDRODIURIL) 25 MG tablet Take 1 tablet (25 mg total) by mouth daily. 06/08/19 06/02/20  Nahser, Wonda Cheng, MD  levocetirizine (XYZAL) 5 MG tablet Take 5 mg by mouth every evening.    [provider]  lisinopril (PRINIVIL,ZESTRIL) 10 MG tablet Take 1 tablet (10 mg total) by mouth daily. 09/20/18   Nahser, Wonda Cheng, MD  lisinopril (ZESTRIL) 10 MG tablet lisinopril  10 mg tabs    [provider]  metoprolol succinate (TOPROL-XL) 50 MG 24 hr tablet Take 2 tablets (100 mg total) by mouth daily. Take with or immediately following a meal. 09/20/18   Nahser, Wonda Cheng, MD  Multiple Vitamins-Minerals (MULTIVITAMINS THER. W/MINERALS) TABS Take 1 tablet by mouth daily.      [provider]  nitroGLYCERIN (NITROSTAT) 0.4 MG SL tablet Place 1 tablet (0.4 mg total) under the tongue every 5 (five) minutes as needed for chest pain. 09/20/18   Nahser, Wonda Cheng, MD  omeprazole (PRILOSEC) 10 MG capsule Take 1 capsule (10 mg total) by mouth daily. 09/20/18   Nahser, Wonda Cheng, MD  potassium chloride (K-DUR) 10 MEQ tablet Take 1  tablet (10 mEq total) by mouth daily. 06/08/19 06/02/20  Nahser, Wonda Cheng, MD  Probiotic Product (PROBIOTIC PO) Take 1 capsule by mouth daily.     [provider]  sildenafil (REVATIO) 20 MG tablet Take 2 to 5 tabs daily as needed for erectile dysfunction 07/18/17   Nahser, Wonda Cheng, MD  Tamsulosin HCl (FLOMAX) 0.4 MG CAPS Take 0.4 mg by mouth daily.      [provider]  topiramate (TOPAMAX) 50 MG tablet Take 1 tablet (50 mg total) by mouth 2 (two) times daily. 06/25/19 06/24/20  Garvin Fila, MD  XARELTO 20 MG TABS tablet TAKE 1 TABLET BY MOUTH EVERY DAY 04/05/19   Nahser, Wonda Cheng, MD    Family History Family History  Problem Relation Age of Onset   Hypertension Mother    Heart disease Father    Heart attack Father  Social History Social History   Tobacco Use   Smoking status: Former Smoker    Packs/day: 1.00    Years: 34.00    Pack years: 34.00    Types: Cigarettes    Quit date: 05/05/2003    Years since quitting: 16.1   Smokeless tobacco: Never Used  Substance Use Topics   Alcohol use: No    Alcohol/week: 0.0 standard drinks   Drug use: No     Allergies   Codeine, Monopril [fosinopril sodium], Dilaudid [hydromorphone hcl], Ampicillin, and Percocet [oxycodone-acetaminophen]   Review of Systems Review of Systems  Respiratory: Positive for chest tightness (resolved).   Cardiovascular: Positive for palpitations.  All other systems reviewed and are negative.    Physical Exam Updated Vital Signs BP 117/69    Pulse 73    Temp 98.6 F (37 C) (Oral)    Resp (!) 23    SpO2 94%   Physical Exam Vitals signs and nursing note reviewed.  Constitutional:      General: He is not in acute distress.    Appearance: He is well-developed.  HENT:     Head: Normocephalic and atraumatic.  Eyes:     Extraocular Movements: Extraocular movements intact.     Conjunctiva/sclera: Conjunctivae normal.     Pupils: Pupils are equal, round, and reactive to light.   Neck:     Musculoskeletal: Normal range of motion and neck supple.  Cardiovascular:     Rate and Rhythm: Regular rhythm. Tachycardia present.     Comments: Tachycardic 130-160, regular Pulmonary:     Effort: Pulmonary effort is normal. No respiratory distress.     Breath sounds: Normal breath sounds. No wheezing.  Abdominal:     General: There is no distension.     Palpations: Abdomen is soft. There is no mass.     Tenderness: There is no abdominal tenderness. There is no guarding or rebound.  Musculoskeletal: Normal range of motion.  Skin:    General: Skin is warm and dry.     Capillary Refill: Capillary refill takes less than 2 seconds.  Neurological:     Mental Status: He is alert and oriented to person, place, and time.      ED Treatments / Results  Labs (all labs ordered are listed, but only abnormal results are displayed) Labs Reviewed  BASIC METABOLIC PANEL - Abnormal; Notable for the following components:      Result Value   CO2 20 (*)    Glucose, Bld 103 (*)    BUN 33 (*)    Creatinine, Ser 1.48 (*)    GFR calc non Af Amer 47 (*)    GFR calc Af Amer 54 (*)    All other components within normal limits  CBC    EKG EKG Interpretation  Date/Time:  Friday July 06 2019 23:11:51 EDT Ventricular Rate:  92 PR Interval:    QRS Duration: 97 QT Interval:  371 QTC Calculation: 459 R Axis:   -37 Text Interpretation:  Sinus rhythm Prolonged PR interval Left axis deviation similar to prior EKGs Confirmed by Malvin Johns [phone]) on 07/06/2019 11:19:24 PM   Radiology Dg Chest Portable 1 View  Result Date: 07/06/2019 CLINICAL DATA:  Atrial fibrillation EXAM: PORTABLE CHEST 1 VIEW COMPARISON:  May 27, 2017 FINDINGS: There is cardiomegaly. Both lungs are clear. The visualized skeletal structures are unremarkable. IMPRESSION: No acute cardiopulmonary process.  Cardiomegaly Electronically Signed   By: Prudencio Pair M.D.   On: 07/06/2019 23:20  Procedures Procedures (including critical care time)  Medications Ordered in ED Medications  sodium chloride flush (NS) 0.9 % injection 3 mL (3 mLs Intravenous Not Given 07/06/19 2209)  sodium chloride 0.9 % bolus 1,000 mL (1,000 mLs Intravenous New Bag/Given 07/06/19 2316)     Initial Impression / Assessment and Plan / ED Course  I have reviewed the triage vital signs and the nursing notes.  Pertinent labs & imaging results that were available during my care of the patient were reviewed by me and considered in my medical decision making (see chart for details).        Pt presenting for evaluation of palpitations and chest tightness.  Physical exam shows patient who appears nontoxic.  Chest tightness has resolved, however patient remains tachycardic between 130 and 160.  Regular rhythm, not in obvious A. fib.  Case discussed with attending, Dr. Tamera Punt agrees to plan.  Will repeat EKG to assess for a flutter versus sinus tach.  Labs show mild dehydration with slight increase in creatinine and BUN.  Otherwise reassuring.  Chest x-ray viewed interpreted by me, no pneumonia, pneumothorax, effusion.  Informed by RN that while talking, patient converted back into normal sinus rhythm.  EKG obtained.  EKG reviewed by Dr. Tamera Punt, unchanged from previous.  Patient without any further chest pain or other signs of ACS.  Do not believe troponin to be beneficial at this time.  Discussed with patient, who is symptom-free and feels well at this time.  Discussed follow-up with cardiology for further evaluation.  Discussed importance of hydration.  At this time, patient appears safe for discharge.  Return precautions given.  Patient states he understands and agrees to plan.  Final Clinical Impressions(s) / ED Diagnoses   Final diagnoses:  Atrial fibrillation with RVR [facility])  Dehydration    ED Discharge Orders    None       Franchot Heidelberg, PA-C 07/07/19 0013    Malvin Johns,  MD 07/20/19 [phone]

## 2019-07-10 ENCOUNTER — Other Ambulatory Visit: Payer: Self-pay

## 2019-07-10 ENCOUNTER — Ambulatory Visit (INDEPENDENT_AMBULATORY_CARE_PROVIDER_SITE_OTHER): Payer: Medicare Other | Admitting: Cardiovascular Disease

## 2019-07-10 ENCOUNTER — Encounter: Payer: Self-pay | Admitting: Cardiovascular Disease

## 2019-07-10 VITALS — BP 134/76 | HR 82 | Ht 73.5 in | Wt 273.4 lb

## 2019-07-10 DIAGNOSIS — I480 Paroxysmal atrial fibrillation: Secondary | ICD-10-CM | POA: Diagnosis not present

## 2019-07-10 DIAGNOSIS — I2510 Atherosclerotic heart disease of native coronary artery without angina pectoris: Secondary | ICD-10-CM

## 2019-07-10 DIAGNOSIS — I6523 Occlusion and stenosis of bilateral carotid arteries: Secondary | ICD-10-CM

## 2019-07-10 DIAGNOSIS — E782 Mixed hyperlipidemia: Secondary | ICD-10-CM | POA: Diagnosis not present

## 2019-07-10 MED ORDER — DILTIAZEM HCL 30 MG PO TABS: 30.0000 mg | ORAL_TABLET | Freq: Four times a day (QID) | ORAL | 6 refills | Status: DC | PRN

## 2019-07-10 NOTE — Progress Notes (Signed)
Cardiology Office Note   Date:  07/10/2019   ID:  Corey [name], DOB 26-Dec-1945, MRN [id number]  PCP:  [name], MD  Cardiologist:   Mertie Moores, MD   Chief Complaint  Patient presents with  . Coronary Artery Disease  . Atrial Fibrillation   Problem List: 1.  . Coronary artery disease-status post PTCA and stenting of his right coronary artery 2. Hypertension 3. Hyperlipidemia 4. 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 Atrial fibrillation - discovered after his stroke    Corey [name] is a 73 yo gentleman with a history of coronary artery disease. Status post PTCA and stenting of his right coronary artery. He also has a history of hypertension, and hyperlipidemia. 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  He had a stroke in Oct. He had some speech problems and loss of fine motor control of the right arm. TEE was negative for PFO or LAA thrombus. Event monitor showed no A-Fib. He has been on Plavix since his stents in  January 05, 2013:  Corey [name] had an episode of atrial fib in January. He was started on Xarelto and has done well.  He has had some fatigue on occasion. He monitors his BP.   He has recovered from his stroke.   Feb. 13, 2015:  Corey [name] is doing ok. Still having touble losing weight. Has had a URI for the past month or so. No CP, no palps. Has recovered well from his CVA. He is looking forward to being able to exercise more   December 23, 2014: Corey [name] is a 73 y.o. male who presents following recent hospitalization for rapid atrial fib. He presented to the hospital with syncope /presyncope.  He was found to have rapid atrial fibrillation.  Was started on IV diltiazem and converted by the next am. He has maintained NSR. Has gained some weight due to inactivity.    07/10/2015:  Corey [name] is doing well.   No CP , no dyspnea.  No HR irregularities.   Has had rapid atrial fib in March , 2016 . Is no Xarelto   January 05, 2016:  Corey [name] is  seen back for CAD and HTN.and PAF Also has anemia and has been found to have low iron.  Has had a colonoscopy ~ 2 years ago.   Negative stool guiac.   Nov. 27, 2017: Corey [name] was admitted to the hospital in Sept. 2017 with diverticulitis. Doing well.   No CP or dyspnea.  Due for his carotid duplex - has mild Carotid artery disease.   September 20, 2017:   Corey [name] is seen today for follow-up visit. His twin brother was diagnosed with parkinsons recently . hes worried about that.   Going to see a neurologist.  Gets fatigued on occasion.    No angina .   Exercising some.   Has some back issues.  Is trying to lose some weight .  Occasionally has some left leg swelling   Dec. 18, 2019 Corey [name] was seen today for follow-up of his coronary artery disease, PAF,  hypertension, hyperlipidemia. Has had some orthostasis  Has lost some weight .  Wt = 270 lbs today  Exercising more,   Needs left hand surgery in January .  Needs cardiac clearance  Is on Xarelto for PAF  - will hold for 2 days prior to hand surgery    Has stable carotid artery disease  Repeat carotid duplex is ordered for  next year    July 10, 2019:  Corey [name] is seen today for follow-up of his coronary artery disease, paroxysmal atrial fibrillation, hypertension, hyperlipidemia  He was seen for telemedicine visit about a month ago.   He started him on HCTZ and potassium at that time.    He was seen in the emergency room several days ago with episodes of palpitations and was found to have atrial fibrillation.  Was getting prepped for cardioversion but then he converted on its own.   This is 2nd episode of AF.  Uses CPAP.  No ETOH.   Encouraged weight loss         Past Medical History:  Diagnosis Date  . Arthritis    "back, right knee, hands, ankles, neck" (05/31/2016)  . Atrial fibrillation with RVR ([place])   . Carotid artery disease ([place]) 08/30/2016   Carotid US 11/19: R 1-39, L 40-59, repeat 12 months  . Chronic lower back  pain   . Coronary atherosclerosis of native coronary artery    a. BMS to [facility] 2004 and 2007, otherwise mild nonobstructive disease. EF normal.  . Diverticulitis   . Dyslipidemia   . Essential hypertension, benign   . GERD (gastroesophageal reflux disease)   . Lumbar radiculopathy, chronic 02/04/2015   Right L5  . Obesity   . OSA on CPAP   . Paroxysmal atrial fibrillation ([place])    a. Discovered after stroke.  . Pneumonia 01/1996  . PONV (postoperative nausea and vomiting)   . Recurrent upper respiratory infection (URI)     Past Surgical History:  Procedure Laterality Date  . ANTERIOR CERVICAL DECOMP/DISCECTOMY FUSION  07/2001; 10/2002   "C5-6; C6-7; redo"  . BACK SURGERY    . CARPAL TUNNEL RELEASE Left 10/2015  . COLONOSCOPY W/ POLYPECTOMY  02/2014  . CORONARY ANGIOPLASTY WITH STENT PLACEMENT  05/2003; 12/2005   "mid RCA; mid RCA"  . JOINT REPLACEMENT    . KNEE ARTHROSCOPY Left 10/2005  . KNEE ARTHROSCOPY W/ PARTIAL MEDIAL MENISCECTOMY Left 09/2005  . LUMBAR LAMINECTOMY/DECOMPRESSION MICRODISCECTOMY  03/2005   "L4-5"  . POSTERIOR LUMBAR FUSION  10/2003   L5-S1; "plates, screws"  . SHOULDER ARTHROSCOPY Right 08/2011   Debridement of labrum, arthroscopic distal clavicle excision  . SHOULDER OPEN ROTATOR CUFF REPAIR Left 07/2014  . TEE WITHOUT CARDIOVERSION  07/07/2012   Procedure: TRANSESOPHAGEAL ECHOCARDIOGRAM (TEE);  Surgeon: Fay Records, MD;  Location: [facility];  Service: Cardiovascular;  Laterality: N/A;  . TONSILLECTOMY AND ADENOIDECTOMY  ~ 1956  . TOTAL KNEE ARTHROPLASTY Left 10/2006  . TRIGGER FINGER RELEASE Left 10/2015     Current Outpatient Medications  Medication Sig Dispense Refill  . acetaminophen (TYLENOL 8 HOUR ARTHRITIS PAIN) 650 MG CR tablet Take 1,300 mg by mouth 2 (two) times daily as needed for pain (pain).     Marland Kitchen ALPRAZolam (XANAX) 0.5 MG tablet Take 0.5 mg by mouth at bedtime as needed.  1  . atorvastatin (LIPITOR) 40 MG tablet Take 1 tablet (40 mg total)  by mouth daily. 90 tablet 3  . fenofibrate 160 MG tablet Take 1 tablet (160 mg total) by mouth daily. 90 tablet 3  . finasteride (PROSCAR) 5 MG tablet finasteride  5 mg tabs    . fluticasone (FLONASE) 50 MCG/ACT nasal spray Place 1 spray into the nose daily.    . hydrochlorothiazide (HYDRODIURIL) 25 MG tablet Take 1 tablet (25 mg total) by mouth daily. 90 tablet 3  . levocetirizine (XYZAL) 5 MG tablet Take 5 mg  by mouth every evening.    Marland Kitchen lisinopril (ZESTRIL) 10 MG tablet lisinopril  10 mg tabs    . metoprolol succinate (TOPROL-XL) 50 MG 24 hr tablet Take 2 tablets (100 mg total) by mouth daily. Take with or immediately following a meal. 180 tablet 3  . Multiple Vitamins-Minerals (MULTIVITAMINS THER. W/MINERALS) TABS Take 1 tablet by mouth daily.      . nitroGLYCERIN (NITROSTAT) 0.4 MG SL tablet Place 1 tablet (0.4 mg total) under the tongue every 5 (five) minutes as needed for chest pain. 25 tablet 5  . omeprazole (PRILOSEC) 10 MG capsule Take 1 capsule (10 mg total) by mouth daily. 90 capsule 3  . potassium chloride (K-DUR) 10 MEQ tablet Take 1 tablet (10 mEq total) by mouth daily. 90 tablet 3  . Probiotic Product (PROBIOTIC PO) Take 1 capsule by mouth daily.     . sildenafil (REVATIO) 20 MG tablet Take 2 to 5 tabs daily as needed for erectile dysfunction 30 tablet 2  . Tamsulosin HCl (FLOMAX) 0.4 MG CAPS Take 0.4 mg by mouth daily.      Marland Kitchen topiramate (TOPAMAX) 50 MG tablet Take 1 tablet (50 mg total) by mouth 2 (two) times daily. 60 tablet 2  . XARELTO 20 MG TABS tablet TAKE 1 TABLET BY MOUTH EVERY DAY 90 tablet 1   No current facility-administered medications for this visit.     Allergies:   Amoxicillin, Fosinopril, Ampicillin, Codeine, Monopril [fosinopril sodium], Dilaudid [hydromorphone hcl], and Percocet [oxycodone-acetaminophen]    Social History:  The patient  reports that he quit smoking about 16 years ago. His smoking use included cigarettes. He has a 34.00 pack-year smoking  history. He has never used smokeless tobacco. He reports that he does not drink alcohol or use drugs.   Family History:  The patient's family history includes Heart attack in his father; Heart disease in his father; Hypertension in his mother.    ROS: As per current history otherwise negative.  Physical Exam: Blood pressure 134/76, pulse 82, height 6' 1.5" (1.867 m), weight 273 lb 6.4 oz (124 kg), SpO2 96 %.  GEN:  Well nourished, well developed in no acute distress HEENT: Normal NECK: No JVD; No carotid bruits LYMPHATICS: No lymphadenopathy CARDIAC: RRR , no murmurs, rubs, gallops RESPIRATORY:  Clear to auscultation without rales, wheezing or rhonchi  ABDOMEN: Soft, non-tender, non-distended MUSCULOSKELETAL:  No edema; No deformity  SKIN: Warm and dry NEUROLOGIC:  Alert and oriented x 3    EKG:    Recent Labs: 09/20/2018: ALT 37 07/06/2019: BUN 33; Creatinine, Ser 1.48; Hemoglobin 14.3; Platelets 282; Potassium 4.0; Sodium 136    Lipid Panel    Component Value Date/Time   CHOL 129 09/20/2018 1004   CHOL 108 01/05/2013 0748   TRIG 81 09/20/2018 1004   TRIG 84 01/05/2013 0748   HDL 38 (L) 09/20/2018 1004   HDL 29 (L) 01/05/2013 0748   CHOLHDL 3.4 09/20/2018 1004   CHOLHDL 3.7 08/30/2016 0840   VLDL 21 08/30/2016 0840   LDLCALC 75 09/20/2018 1004   LDLCALC 62 01/05/2013 0748      Wt Readings from Last 3 Encounters:  07/10/19 273 lb 6.4 oz (124 kg)  06/25/19 277 lb (125.6 kg)  06/08/19 275 lb (124.7 kg)      Other studies Reviewed: Additional studies/ records that were reviewed today include: . Review of the above records demonstrates:    ASSESSMENT AND PLAN:  1.  Coronary artery disease-    No angina  2. Hypertension - BP is well controlled   3. Carotid artery disease -  .  Stable   4. Hyperlipidemia -    Labs from primary MD look ok   5. 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  Paroxysmal atrial fibrillation - discovered after his stroke-   CHADS2VASC is   51 ( age, CAD, CVA, HTN, ) ,  He is having more episodes of paroxysmal atrial fibrillation.  He is had 2 episodes this month.  We will get an echocardiogram.  We will check a TSH.  His electrolytes were checked in the emergency room several days ago.  We will refer him to the A. fib clinic for further evaluation of possible A. fib ablation in the near future.  We discussed weight loss.  We discussed the importance of making sure that his CPAP works properly.  7. Fatigue:     8. Iron deficiency:    Current medicines are reviewed at length with the patient today.  The patient does not have concerns regarding medicines.  The following changes have been made:  no change  Labs/ tests ordered today include:   No orders of the defined types were placed in this encounter.   Disposition:   FU with me in 6 months    Mertie Moores, MD  07/10/2019 2:58 PM    [address], [place], [place]  [address] Phone: [phone]; Fax: [phone]

## 2019-07-10 NOTE — Patient Instructions (Signed)
Medication Instructions:  Your physician has recommended you make the following change in your medication: START Cardizem (diltiazem) 30 mg as needed up to 4 times per day for a fib  If you need a refill on your cardiac medications before your next appointment, please call your pharmacy.   Lab work: TODAY - TSH If you have labs (blood work) drawn today and your tests are completely normal, you will receive your results only by: Marland Kitchen MyChart Message (if you have MyChart) OR . A paper copy in the mail If you have any lab test that is abnormal or we need to change your treatment, we will call you to review the results.  Testing/Procedures: Your physician has requested that you have an echocardiogram. Echocardiography is a painless test that uses sound waves to create images of your heart. It provides your doctor with information about the size and shape of your heart and how well your heart's chambers and valves are working. This procedure takes approximately one hour. There are no restrictions for this procedure.    Follow-Up: You have been referred to A Fib Clinic for management of your A Fib and to discuss ablation   At [facility], you and your health needs are our priority.  As part of our continuing mission to provide you with exceptional heart care, we have created designated Provider Care Teams.  These Care Teams include your primary Cardiologist (physician) and Advanced Practice Providers (APPs -  Physician Assistants and Nurse Practitioners) who all work together to provide you with the care you need, when you need it. You will need a follow up appointment in:  6 months.  Please call our office 2 months in advance to schedule this appointment.  You may see Mertie Moores, MD or one of the following Advanced Practice Providers on your designated Care Team: Richardson Dopp, PA-C [place], [place] . Daune Perch, NP

## 2019-07-11 LAB — TSH: TSH: 2.31 u[IU]/mL (ref 0.450–4.500)

## 2019-07-19 ENCOUNTER — Telehealth: Payer: Self-pay | Admitting: Pharmacist

## 2019-07-19 ENCOUNTER — Other Ambulatory Visit: Payer: Self-pay

## 2019-07-19 ENCOUNTER — Ambulatory Visit (HOSPITAL_COMMUNITY)
Admission: RE | Admit: 2019-07-19 | Discharge: 2019-07-19 | Disposition: A | Discharge status: Home / Self Care | Payer: Medicare Other | Source: Ambulatory Visit | Attending: Physician Assistant | Admitting: Physician Assistant

## 2019-07-19 ENCOUNTER — Ambulatory Visit (HOSPITAL_BASED_OUTPATIENT_CLINIC_OR_DEPARTMENT_OTHER): Payer: Medicare Other

## 2019-07-19 VITALS — BP 148/82 | HR 59 | Ht 73.5 in | Wt 271.0 lb

## 2019-07-19 DIAGNOSIS — Z6835 Body mass index (BMI) 35.0-35.9, adult: Secondary | ICD-10-CM | POA: Insufficient documentation

## 2019-07-19 DIAGNOSIS — Z79899 Other long term (current) drug therapy: Secondary | ICD-10-CM | POA: Diagnosis not present

## 2019-07-19 DIAGNOSIS — Z87891 Personal history of nicotine dependence: Secondary | ICD-10-CM | POA: Diagnosis not present

## 2019-07-19 DIAGNOSIS — Z7901 Long term (current) use of anticoagulants: Secondary | ICD-10-CM | POA: Insufficient documentation

## 2019-07-19 DIAGNOSIS — E785 Hyperlipidemia, unspecified: Secondary | ICD-10-CM | POA: Insufficient documentation

## 2019-07-19 DIAGNOSIS — I119 Hypertensive heart disease without heart failure: Secondary | ICD-10-CM | POA: Diagnosis not present

## 2019-07-19 DIAGNOSIS — E669 Obesity, unspecified: Secondary | ICD-10-CM | POA: Diagnosis not present

## 2019-07-19 DIAGNOSIS — K219 Gastro-esophageal reflux disease without esophagitis: Secondary | ICD-10-CM | POA: Insufficient documentation

## 2019-07-19 DIAGNOSIS — I083 Combined rheumatic disorders of mitral, aortic and tricuspid valves: Secondary | ICD-10-CM | POA: Diagnosis not present

## 2019-07-19 DIAGNOSIS — I2510 Atherosclerotic heart disease of native coronary artery without angina pectoris: Secondary | ICD-10-CM | POA: Insufficient documentation

## 2019-07-19 DIAGNOSIS — I480 Paroxysmal atrial fibrillation: Secondary | ICD-10-CM | POA: Insufficient documentation

## 2019-07-19 DIAGNOSIS — G4733 Obstructive sleep apnea (adult) (pediatric): Secondary | ICD-10-CM | POA: Diagnosis not present

## 2019-07-19 MED ORDER — PERFLUTREN LIPID MICROSPHERE
1.0000 mL | INTRAVENOUS | Status: AC | PRN
  Administered 2019-07-19: 2 mL via INTRAVENOUS

## 2019-07-19 NOTE — Progress Notes (Signed)
Primary Care Physician: [name], MD Primary Cardiologist: Dr Acie Fredrickson Primary Electrophysiologist: none Referring Physician: Dr Deatra Canter is a 73 y.o. male with a history of CAD, HTN, OSA, CVA, paroxysmal atrial fibrillation, and HLD who presents for consultation in the [facility].  The patient was initially diagnosed with atrial fibrillation in 2016 after presenting with symptoms of presyncope. He converted on IV diltiazem and has been maintained on Xarelto. Patient had done well for years until 07/06/19 when he presented to the ER with symptoms of palpitations and chest pressure and found to be in afib vs atrial flutter with RVR. He converted while being evaluated in the ER and his symptoms resolved. Since then, he has not had any further symptoms. He denies any specific triggers that he could identify. He has a diagnosis of OSA and is compliant with his CPAP. He denies significant alcohol use.   Today, he denies symptoms of palpitations, chest pain, shortness of breath, orthopnea, PND, lower extremity edema, dizziness, presyncope, syncope, snoring, daytime somnolence, bleeding, or neurologic sequela. The patient is tolerating medications without difficulties and is otherwise without complaint today.    Atrial Fibrillation Risk Factors:  he does have symptoms or diagnosis of sleep apnea. he is compliant with CPAP therapy. he does not have a history of rheumatic fever. he does not have a history of alcohol use. The patient does not have a history of early familial atrial fibrillation or other arrhythmias.  he has a BMI of Body mass index is 35.27 kg/m.Marland Kitchen Filed Weights   07/19/19 1033  Weight: 122.9 kg    Family History  Problem Relation Age of Onset  . Hypertension Mother   . Heart disease Father   . Heart attack Father      Atrial Fibrillation Management history:  Previous antiarrhythmic drugs: none Previous cardioversions: none  Previous ablations: none CHADS2VASC score: 5 Anticoagulation history: Xarelto    Past Medical History:  Diagnosis Date  . Arthritis    "back, right knee, hands, ankles, neck" (05/31/2016)  . Atrial fibrillation with RVR ([place])   . Carotid artery disease ([place]) 08/30/2016   Carotid US 11/19: R 1-39, L 40-59, repeat 12 months  . Chronic lower back pain   . Coronary atherosclerosis of native coronary artery    a. BMS to [facility] 2004 and 2007, otherwise mild nonobstructive disease. EF normal.  . Diverticulitis   . Dyslipidemia   . Essential hypertension, benign   . GERD (gastroesophageal reflux disease)   . Lumbar radiculopathy, chronic 02/04/2015   Right L5  . Obesity   . OSA on CPAP   . Paroxysmal atrial fibrillation ([place])    a. Discovered after stroke.  . Pneumonia 01/1996  . PONV (postoperative nausea and vomiting)   . Recurrent upper respiratory infection (URI)    Past Surgical History:  Procedure Laterality Date  . ANTERIOR CERVICAL DECOMP/DISCECTOMY FUSION  07/2001; 10/2002   "C5-6; C6-7; redo"  . BACK SURGERY    . CARPAL TUNNEL RELEASE Left 10/2015  . COLONOSCOPY W/ POLYPECTOMY  02/2014  . CORONARY ANGIOPLASTY WITH STENT PLACEMENT  05/2003; 12/2005   "mid RCA; mid RCA"  . JOINT REPLACEMENT    . KNEE ARTHROSCOPY Left 10/2005  . KNEE ARTHROSCOPY W/ PARTIAL MEDIAL MENISCECTOMY Left 09/2005  . LUMBAR LAMINECTOMY/DECOMPRESSION MICRODISCECTOMY  03/2005   "L4-5"  . POSTERIOR LUMBAR FUSION  10/2003   L5-S1; "plates, screws"  . SHOULDER ARTHROSCOPY Right 08/2011   Debridement of  labrum, arthroscopic distal clavicle excision  . SHOULDER OPEN ROTATOR CUFF REPAIR Left 07/2014  . TEE WITHOUT CARDIOVERSION  07/07/2012   Procedure: TRANSESOPHAGEAL ECHOCARDIOGRAM (TEE);  Surgeon: Fay Records, MD;  Location: [facility];  Service: Cardiovascular;  Laterality: N/A;  . TONSILLECTOMY AND ADENOIDECTOMY  ~ 1956  . TOTAL KNEE ARTHROPLASTY Left 10/2006  . TRIGGER FINGER RELEASE Left 10/2015     Current Outpatient Medications  Medication Sig Dispense Refill  . acetaminophen (TYLENOL 8 HOUR ARTHRITIS PAIN) 650 MG CR tablet Take 1,300 mg by mouth daily as needed for pain (pain).     Marland Kitchen ALPRAZolam (XANAX) 0.5 MG tablet Take 0.5 mg by mouth at bedtime as needed.  1  . atorvastatin (LIPITOR) 40 MG tablet Take 1 tablet (40 mg total) by mouth daily. 90 tablet 3  . fenofibrate 160 MG tablet Take 1 tablet (160 mg total) by mouth daily. 90 tablet 3  . finasteride (PROSCAR) 5 MG tablet finasteride  5 mg tabs    . fluticasone (FLONASE) 50 MCG/ACT nasal spray Place 1 spray into the nose daily.    . hydrochlorothiazide (HYDRODIURIL) 25 MG tablet Take 1 tablet (25 mg total) by mouth daily. 90 tablet 3  . levocetirizine (XYZAL) 5 MG tablet Take 5 mg by mouth every evening.    Marland Kitchen lisinopril (ZESTRIL) 10 MG tablet lisinopril  10 mg tabs    . metoprolol succinate (TOPROL-XL) 50 MG 24 hr tablet Take 2 tablets (100 mg total) by mouth daily. Take with or immediately following a meal. 180 tablet 3  . Multiple Vitamins-Minerals (MULTIVITAMINS THER. W/MINERALS) TABS Take 1 tablet by mouth daily.      . nitroGLYCERIN (NITROSTAT) 0.4 MG SL tablet Place 1 tablet (0.4 mg total) under the tongue every 5 (five) minutes as needed for chest pain. 25 tablet 5  . omeprazole (PRILOSEC) 10 MG capsule Take 1 capsule (10 mg total) by mouth daily. 90 capsule 3  . potassium chloride (K-DUR) 10 MEQ tablet Take 1 tablet (10 mEq total) by mouth daily. 90 tablet 3  . Probiotic Product (PROBIOTIC PO) Take 1 capsule by mouth daily.     . sildenafil (REVATIO) 20 MG tablet Take 2 to 5 tabs daily as needed for erectile dysfunction 30 tablet 2  . Tamsulosin HCl (FLOMAX) 0.4 MG CAPS Take 0.4 mg by mouth daily.      Marland Kitchen topiramate (TOPAMAX) 50 MG tablet Take 1 tablet (50 mg total) by mouth 2 (two) times daily. 60 tablet 2  . XARELTO 20 MG TABS tablet TAKE 1 TABLET BY MOUTH EVERY DAY 90 tablet 1  . diltiazem (CARDIZEM) 30 MG tablet Take 1  tablet (30 mg total) by mouth 4 (four) times daily as needed. (Patient not taking: Reported on 07/19/2019) 60 tablet 6   No current facility-administered medications for this encounter.     Allergies  Allergen Reactions  . Amoxicillin Swelling  . Fosinopril Other (See Comments)  . Ampicillin Rash  . Codeine Nausea Only and Other (See Comments)    Heavy amounts cause nausea  . Monopril [Fosinopril Sodium] Other (See Comments)    Muscle aches & pain  . Dilaudid [Hydromorphone Hcl] Nausea Only  . Percocet [Oxycodone-Acetaminophen] Nausea And Vomiting    Social History   Socioeconomic History  . Marital status: Married    Spouse name: [name]  . Number of children: 3  . Years of education: 59  . Highest education level: Not on file  Occupational History  . Occupation: Retired  Employer: DISABLED     Comment: Works at [employer] part time  Social Needs  . Financial resource strain: Not on file  . Food insecurity    Worry: Not on file    Inability: Not on file  . Transportation needs    Medical: Not on file    Non-medical: Not on file  Tobacco Use  . Smoking status: Former Smoker    Packs/day: 1.00    Years: 34.00    Pack years: 34.00    Types: Cigarettes    Quit date: 05/05/2003    Years since quitting: 16.2  . Smokeless tobacco: Never Used  Substance and Sexual Activity  . Alcohol use: No    Alcohol/week: 0.0 standard drinks  . Drug use: No  . Sexual activity: Yes  Lifestyle  . Physical activity    Days per week: Not on file    Minutes per session: Not on file  . Stress: Not on file  Relationships  . Social Herbalist on phone: Not on file    Gets together: Not on file    Attends religious service: Not on file    Active member of club or organization: Not on file    Attends meetings of clubs or organizations: Not on file    Relationship status: Not on file  . Intimate partner violence    Fear of current or ex partner: Not on file    Emotionally  abused: Not on file    Physically abused: Not on file    Forced sexual activity: Not on file  Other Topics Concern  . Not on file  Social History Narrative   Patient lives at home with spouse.   Caffeine Use:      ROS- All systems are reviewed and negative except as per the HPI above.  Physical Exam: Vitals:   07/19/19 1033  BP: (!) 148/82  Pulse: (!) 59  Weight: 122.9 kg  Height: 6' 1.5" (1.867 m)    GEN- The patient is well appearing obese male, alert and oriented x 3 today.   Head- normocephalic, atraumatic Eyes-  Sclera clear, conjunctiva pink Ears- hearing intact Oropharynx- clear Neck- supple  Lungs- Clear to ausculation bilaterally, normal work of breathing Heart- Regular rate and rhythm, no murmurs, rubs or gallops  GI- soft, NT, ND, + BS Extremities- no clubbing, cyanosis, or edema MS- no significant deformity or atrophy Skin- no rash or lesion Psych- euthymic mood, full affect Neuro- strength and sensation are intact  Wt Readings from Last 3 Encounters:  07/19/19 122.9 kg  07/10/19 124 kg  06/25/19 125.6 kg    EKG today demonstrates SR HR 59, PR 190, QRS 98, QTc 431  Echo 07/19/19 demonstrated   1. Left ventricular ejection fraction, by visual estimation, is 60 to 65%. The left ventricle has normal function. Normal left ventricular size. There is mildly increased left ventricular hypertrophy.  2. Definity contrast agent was given IV to delineate the left ventricular endocardial borders.  3. Global right ventricle has normal systolic function.The right ventricular size is normal. No increase in right ventricular wall thickness.  4. Left atrial size was mildly dilated.  5. Right atrial size was normal.  6. The mitral valve is normal in structure. Trace mitral valve regurgitation. No evidence of mitral stenosis.  7. The tricuspid valve is normal in structure. Tricuspid valve regurgitation was not visualized by color flow Doppler.  8. The aortic valve is  tricuspid Aortic valve regurgitation was  not visualized by color flow Doppler. Mild aortic valve sclerosis without stenosis.  9. The pulmonic valve was normal in structure. Pulmonic valve regurgitation is not visualized by color flow Doppler. 10. The inferior vena cava is normal in size with greater than 50% respiratory variability, suggesting right atrial pressure of 3 mmHg.  Epic records are reviewed at length today  Assessment and Plan:  1. Paroxysmal atrial fibrillation General education about afib provided and questions answered. We discussed therapeutic options including PRN diltiazem vs AAD vs ablation. After discussing the risks and benefits, patient would like to pursue dofetilide.  QTc 431 in SR. Patient will look into the price of the medication. We will forward his med list to pharmacy for review. He will need to stop HCTZ 3 days prior to admission.  Continue Toprol 100 mg BID Continue Xarelto 20 mg daily Continue diltiazem 30 mg PRN q4hrs for heart racing.  This patients CHA2DS2-VASc Score and unadjusted Ischemic Stroke Rate (% per year) is equal to 7.2 % stroke rate/year from a score of 5  Above score calculated as 1 point each if present [CHF, HTN, DM, Vascular=MI/PAD/Aortic Plaque, Age if 65-74, or Male] Above score calculated as 2 points each if present [Age > 75, or Stroke/TIA/TE]   2. Obesity Body mass index is 35.27 kg/m. Lifestyle modification was discussed at length including regular exercise and weight reduction. Patient has lost 4 lbs. Limited by orthopedic issues.   3. Obstructive sleep apnea The importance of adequate treatment of sleep apnea was discussed today in order to improve our ability to maintain sinus rhythm long term. Patient reports compliance with CPAP.   4. CAD No anginal symptoms. Continue present therapy and risk factor modification.   5. HTN Borderline today. Will need to stop HCTZ if he is started on dofetilide. Will adjust  medications at that point if needed.    Patient will reach out to the AF clinic regarding dofetilide admission in the next two weeks.    [facility] [address] [place], [place] [address] [phone] 07/19/2019 11:26 AM

## 2019-07-19 NOTE — Telephone Encounter (Signed)
Medication list reviewed in anticipation of upcoming Tikosyn initiation. Patient is taking HCTZ which is contraindicated with Tikosyn and will need to be stopped at least 3 days prior to admission.  Patient is anticoagulated on Xarelto 20mg  daily on the appropriate dose. Please ensure that patient has not missed any anticoagulation doses in the 3 weeks prior to Tikosyn initiation.   Patient will need to be counseled to avoid use of Benadryl while on Tikosyn and in the 2-3 days prior to Tikosyn initiation.

## 2019-08-20 ENCOUNTER — Other Ambulatory Visit (HOSPITAL_COMMUNITY): Payer: Self-pay | Admitting: Physician Assistant

## 2019-08-20 ENCOUNTER — Other Ambulatory Visit: Payer: Self-pay

## 2019-08-20 ENCOUNTER — Ambulatory Visit (HOSPITAL_COMMUNITY)
Admission: RE | Admit: 2019-08-20 | Discharge: 2019-08-20 | Disposition: A | Discharge status: Home / Self Care | Payer: Medicare Other | Source: Ambulatory Visit | Attending: Cardiovascular Disease | Admitting: Cardiovascular Disease

## 2019-08-20 DIAGNOSIS — I6523 Occlusion and stenosis of bilateral carotid arteries: Secondary | ICD-10-CM | POA: Diagnosis not present

## 2019-08-21 ENCOUNTER — Encounter: Payer: Self-pay | Admitting: Physician Assistant

## 2019-09-07 ENCOUNTER — Emergency Department (HOSPITAL_COMMUNITY): Payer: Medicare Other

## 2019-09-07 ENCOUNTER — Encounter (HOSPITAL_COMMUNITY): Payer: Self-pay | Admitting: Emergency Medicine

## 2019-09-07 ENCOUNTER — Other Ambulatory Visit: Payer: Self-pay

## 2019-09-07 ENCOUNTER — Telehealth: Payer: Self-pay | Admitting: Physician Assistant

## 2019-09-07 ENCOUNTER — Emergency Department (HOSPITAL_COMMUNITY)
Admission: EM | Admit: 2019-09-07 | Discharge: 2019-09-08 | Disposition: A | Discharge status: Home / Self Care | Payer: Medicare Other | Attending: Emergency Medicine | Admitting: Emergency Medicine

## 2019-09-07 DIAGNOSIS — I2510 Atherosclerotic heart disease of native coronary artery without angina pectoris: Secondary | ICD-10-CM | POA: Insufficient documentation

## 2019-09-07 DIAGNOSIS — Z79899 Other long term (current) drug therapy: Secondary | ICD-10-CM | POA: Insufficient documentation

## 2019-09-07 DIAGNOSIS — R0789 Other chest pain: Secondary | ICD-10-CM | POA: Diagnosis present

## 2019-09-07 DIAGNOSIS — Z96652 Presence of left artificial knee joint: Secondary | ICD-10-CM | POA: Insufficient documentation

## 2019-09-07 DIAGNOSIS — Z87891 Personal history of nicotine dependence: Secondary | ICD-10-CM | POA: Diagnosis not present

## 2019-09-07 DIAGNOSIS — I10 Essential (primary) hypertension: Secondary | ICD-10-CM | POA: Diagnosis not present

## 2019-09-07 DIAGNOSIS — I4891 Unspecified atrial fibrillation: Secondary | ICD-10-CM | POA: Diagnosis not present

## 2019-09-07 LAB — TROPONIN I (HIGH SENSITIVITY)
Troponin I (High Sensitivity): 56 ng/L — ABNORMAL HIGH (ref <18)
Troponin I (High Sensitivity): 132 ng/L (ref <18)

## 2019-09-07 LAB — CBC
HCT: 44.8 % (ref 39.0–52.0)
Hemoglobin: 14.8 g/dL (ref 13.0–17.0)
MCH: 30.8 pg (ref 26.0–34.0)
MCHC: 33 g/dL (ref 30.0–36.0)
MCV: 93.1 fL (ref 80.0–100.0)
Platelets: 265 10*3/uL (ref 150–400)
RBC: 4.81 MIL/uL (ref 4.22–5.81)
RDW: 13.5 % (ref 11.5–15.5)
WBC: 6.9 10*3/uL (ref 4.0–10.5)
nRBC: 0 % (ref 0.0–0.2)

## 2019-09-07 LAB — BASIC METABOLIC PANEL
Anion gap: 13 (ref 5–15)
BUN: 22 mg/dL (ref 8–23)
CO2: 18 mmol/L — ABNORMAL LOW (ref 22–32)
Calcium: 9.4 mg/dL (ref 8.9–10.3)
Chloride: 105 mmol/L (ref 98–111)
Creatinine, Ser: 1.25 mg/dL — ABNORMAL HIGH (ref 0.61–1.24)
GFR calc Af Amer: >60 mL/min (ref >60)
GFR calc non Af Amer: 57 mL/min — ABNORMAL LOW (ref >60)
Glucose, Bld: 122 mg/dL — ABNORMAL HIGH (ref 70–99)
Potassium: 3.8 mmol/L (ref 3.5–5.1)
Sodium: 136 mmol/L (ref 135–145)

## 2019-09-07 MED ORDER — DILTIAZEM HCL 25 MG/5ML IV SOLN
20.0000 mg | Freq: Once | INTRAVENOUS | Status: AC
  Administered 2019-09-07: 20 mg via INTRAVENOUS
  Filled 2019-09-07: qty 5

## 2019-09-07 MED ORDER — SODIUM CHLORIDE 0.9 % IV BOLUS
1000.0000 mL | Freq: Once | INTRAVENOUS | Status: AC
  Administered 2019-09-07: 1000 mL via INTRAVENOUS

## 2019-09-07 MED ORDER — ASPIRIN 81 MG PO CHEW
324.0000 mg | CHEWABLE_TABLET | Freq: Once | ORAL | Status: AC
  Administered 2019-09-07: 324 mg via ORAL
  Filled 2019-09-07: qty 4

## 2019-09-07 MED ORDER — ETOMIDATE 2 MG/ML IV SOLN
0.1000 mg/kg | Freq: Once | INTRAVENOUS | Status: DC
  Filled 2019-09-07: qty 10

## 2019-09-07 MED ORDER — SODIUM CHLORIDE 0.9% FLUSH: 3.0000 mL | Freq: Once | INTRAVENOUS | Status: DC

## 2019-09-07 MED ORDER — LACTATED RINGERS IV BOLUS
1000.0000 mL | Freq: Once | INTRAVENOUS | Status: AC
  Administered 2019-09-07: 1000 mL via INTRAVENOUS

## 2019-09-07 NOTE — ED Triage Notes (Signed)
Pt having 3/10 cp and palpitation since 3 pm today not relief with his Cardizem. Pt has a hx of AFib on XArellto.

## 2019-09-07 NOTE — ED Notes (Signed)
Provider aware of critical trop

## 2019-09-07 NOTE — Telephone Encounter (Signed)
   The patient called the answering service after-hours today. He believes he went back into atrial fib around 4pm with HR in the 140-150 range. He took a dose of diltiazem but his HR has remained the same over the last few hours. His blood pressure is not registering. He was recently undergoing eval by the afib clinic to consider Tikosyn initiation. He questions whether he should proceed to ED as he does not feel comfortable in atrial fib - given lack of clinical response to diltiazem, inability to titrate further based on lack of BP information, and prior consideration that he would require antiarrhythmic initiation I believe this is warranted. He agrees and will have his wife drive him. Recommend standard ED eval, but I suspect he will need cardiology/EP to see him for admission if he remains in atrial fib (or flutter) in the ED. The patient verbalized understanding and gratitude.  Charlie Pitter, PA-C

## 2019-09-07 NOTE — ED Provider Notes (Signed)
[facility] EMERGENCY DEPARTMENT Provider Note   CSN: [id number] Arrival date & time: 09/07/19  1903     History   Chief Complaint Chief Complaint  Patient presents with  . Chest Pain    HPI Corey [name] is a 73 y.o. male.     HPI   Pt is a 74 y/o male with a h/o afib (on Xarelto), CAD, divertiulitis, HLD, HTN, GERD, OSA, who presents to the ED today eval of chest pain that started at 4:30PM while he was walking in his home. Pain located to the mid upper/left chest and does not radiate. Pain felt like an aching pain. At its worst, pain rated 3-4/10. Currently, he has no pain. Pain has been intermittent and does not seem to be associated with exertion. He reports associated nausea and generalized weakness, and palpitations in his throat. Denies SOB, pain with inspiration.   Denies fevers, vomiting, diarrhea, abd pain, urinary sxs. Reports mild LLE edema that is somewhat chronic for him following a knee replacement many years ago.   Denies hemoptysis, recent surgery, recent long travel, hormone use, personal hx of cancer, or hx of DVT/PE. He has not missed any doses of his xarelto.   He noticed that his HR was in the 140s. He was instructed to take cardizem when he feels his Afib so he took  Talked to cards about starting tikosin  Past Medical History:  Diagnosis Date  . Arthritis    "back, right knee, hands, ankles, neck" (05/31/2016)  . Atrial fibrillation with RVR ([place])   . Carotid artery disease ([place]) 08/30/2016   Carotid US 11/19: R 1-39, L 40-59 // Carotid US 08/2019: R 1-39; L 40-59; repeat 1 year  . Chronic lower back pain   . Coronary atherosclerosis of native coronary artery    a. BMS to [facility] 2004 and 2007, otherwise mild nonobstructive disease. EF normal.  . Diverticulitis   . Dyslipidemia   . Essential hypertension, benign   . GERD (gastroesophageal reflux disease)   . Lumbar radiculopathy, chronic 02/04/2015   Right L5  . Obesity   . OSA on  CPAP   . Paroxysmal atrial fibrillation ([place])    a. Discovered after stroke.  . Pneumonia 01/1996  . PONV (postoperative nausea and vomiting)   . Recurrent upper respiratory infection (URI)     Patient Active Problem List   Diagnosis Date Noted  . Essential tremor 06/25/2019  . Chest pain 05/27/2017  . Demand ischemia ([place]) 05/27/2017  . Morbid obesity ([place]) 05/27/2017  . PAF (paroxysmal atrial fibrillation) ([place]) 08/30/2016  . Carotid artery disease ([place]) 08/30/2016  . Essential hypertension 08/30/2016  . Diverticulitis of intestine without perforation or abscess without bleeding   . Hyponatremia 06/04/2016  . Diverticulitis 05/31/2016  . Iron deficiency 01/05/2016  . Lumbar radiculopathy, chronic 02/04/2015  . SVT (supraventricular tachycardia) ([place]) 12/03/2014  . PAC (premature atrial contraction) 04/05/2014  . OSA (obstructive sleep apnea) 03/27/2014  . Atrial fibrillation ([place]) 01/05/2013  . CVA (cerebral infarction) 07/06/2012  . Coronary artery disease   . Dyslipidemia   . GERD (gastroesophageal reflux disease)     Past Surgical History:  Procedure Laterality Date  . ANTERIOR CERVICAL DECOMP/DISCECTOMY FUSION  07/2001; 10/2002   "C5-6; C6-7; redo"  . BACK SURGERY    . CARPAL TUNNEL RELEASE Left 10/2015  . COLONOSCOPY W/ POLYPECTOMY  02/2014  . CORONARY ANGIOPLASTY WITH STENT PLACEMENT  05/2003; 12/2005   "mid RCA; mid RCA"  . JOINT  REPLACEMENT    . KNEE ARTHROSCOPY Left 10/2005  . KNEE ARTHROSCOPY W/ PARTIAL MEDIAL MENISCECTOMY Left 09/2005  . LUMBAR LAMINECTOMY/DECOMPRESSION MICRODISCECTOMY  03/2005   "L4-5"  . POSTERIOR LUMBAR FUSION  10/2003   L5-S1; "plates, screws"  . SHOULDER ARTHROSCOPY Right 08/2011   Debridement of labrum, arthroscopic distal clavicle excision  . SHOULDER OPEN ROTATOR CUFF REPAIR Left 07/2014  . TEE WITHOUT CARDIOVERSION  07/07/2012   Procedure: TRANSESOPHAGEAL ECHOCARDIOGRAM (TEE);  Surgeon: Fay Records, MD;  Location: [facility];   Service: Cardiovascular;  Laterality: N/A;  . TONSILLECTOMY AND ADENOIDECTOMY  ~ 1956  . TOTAL KNEE ARTHROPLASTY Left 10/2006  . TRIGGER FINGER RELEASE Left 10/2015        Home Medications    Prior to Admission medications   Medication Sig Start Date End Date Taking? Authorizing Provider  acetaminophen (TYLENOL 8 HOUR ARTHRITIS PAIN) 650 MG CR tablet Take 1,300 mg by mouth daily as needed for pain (pain).     [provider]  ALPRAZolam Duanne Moron) 0.5 MG tablet Take 0.5 mg by mouth at bedtime as needed. 09/20/17   [provider]  atorvastatin (LIPITOR) 40 MG tablet Take 1 tablet (40 mg total) by mouth daily. 06/25/19   Nahser, Wonda Cheng, MD  diltiazem (CARDIZEM) 30 MG tablet Take 1 tablet (30 mg total) by mouth 4 (four) times daily as needed. Patient not taking: Reported on 07/19/2019 07/10/19   Nahser, Wonda Cheng, MD  fenofibrate 160 MG tablet Take 1 tablet (160 mg total) by mouth daily. 10/16/18   Nahser, Wonda Cheng, MD  finasteride (PROSCAR) 5 MG tablet finasteride  5 mg tabs    [provider]  fluticasone (FLONASE) 50 MCG/ACT nasal spray Place 1 spray into the nose daily.    [provider]  hydrochlorothiazide (HYDRODIURIL) 25 MG tablet Take 1 tablet (25 mg total) by mouth daily. 06/08/19 06/02/20  Nahser, Wonda Cheng, MD  levocetirizine (XYZAL) 5 MG tablet Take 5 mg by mouth every evening.    [provider]  lisinopril (ZESTRIL) 10 MG tablet lisinopril  10 mg tabs    [provider]  metoprolol succinate (TOPROL-XL) 50 MG 24 hr tablet Take 2 tablets (100 mg total) by mouth daily. Take with or immediately following a meal. 09/20/18   Nahser, Wonda Cheng, MD  Multiple Vitamins-Minerals (MULTIVITAMINS THER. W/MINERALS) TABS Take 1 tablet by mouth daily.      [provider]  nitroGLYCERIN (NITROSTAT) 0.4 MG SL tablet Place 1 tablet (0.4 mg total) under the tongue every 5 (five) minutes as needed for chest pain. 09/20/18   Nahser, Wonda Cheng, MD   omeprazole (PRILOSEC) 10 MG capsule Take 1 capsule (10 mg total) by mouth daily. 09/20/18   Nahser, Wonda Cheng, MD  potassium chloride (K-DUR) 10 MEQ tablet Take 1 tablet (10 mEq total) by mouth daily. 06/08/19 06/02/20  Nahser, Wonda Cheng, MD  Probiotic Product (PROBIOTIC PO) Take 1 capsule by mouth daily.     [provider]  sildenafil (REVATIO) 20 MG tablet Take 2 to 5 tabs daily as needed for erectile dysfunction 07/18/17   Nahser, Wonda Cheng, MD  Tamsulosin HCl (FLOMAX) 0.4 MG CAPS Take 0.4 mg by mouth daily.      [provider]  topiramate (TOPAMAX) 50 MG tablet Take 1 tablet (50 mg total) by mouth 2 (two) times daily. 06/25/19 06/24/20  Garvin Fila, MD  XARELTO 20 MG TABS tablet TAKE 1 TABLET BY MOUTH EVERY DAY 04/05/19  Nahser, Wonda Cheng, MD    Family History Family History  Problem Relation Age of Onset  . Hypertension Mother   . Heart disease Father   . Heart attack Father     Social History Social History   Tobacco Use  . Smoking status: Former Smoker    Packs/day: 1.00    Years: 34.00    Pack years: 34.00    Types: Cigarettes    Quit date: 05/05/2003    Years since quitting: 16.3  . Smokeless tobacco: Never Used  Substance Use Topics  . Alcohol use: No    Alcohol/week: 0.0 standard drinks  . Drug use: No     Allergies   Amoxicillin, Fosinopril, Ampicillin, Codeine, Monopril [fosinopril sodium], Dilaudid [hydromorphone hcl], and Percocet [oxycodone-acetaminophen]   Review of Systems Review of Systems  Constitutional: Negative for chills, diaphoresis and fever.  HENT: Negative for ear pain and sore throat.   Eyes: Negative for visual disturbance.  Respiratory: Negative for cough and shortness of breath.   Cardiovascular: Positive for chest pain, palpitations and leg swelling (chronic).  Gastrointestinal: Positive for nausea. Negative for abdominal pain, constipation, diarrhea and vomiting.  Genitourinary: Negative for dysuria and hematuria.   Musculoskeletal: Negative for back pain.  Skin: Negative for color change and rash.  Neurological: Negative for headaches.  All other systems reviewed and are negative.    Physical Exam Updated Vital Signs BP 110/71 (BP Location: Left Arm)   Pulse (!) 56   Temp 98.5 F (36.9 C) (Oral)   Resp 18   Ht 6' 1.5" (1.867 m)   Wt 122.9 kg   SpO2 99%   BMI 35.27 kg/m   Physical Exam Vitals signs and nursing note reviewed.  Constitutional:      Appearance: He is well-developed.  HENT:     Head: Normocephalic and atraumatic.  Eyes:     Conjunctiva/sclera: Conjunctivae normal.  Neck:     Musculoskeletal: Neck supple.  Cardiovascular:     Rate and Rhythm: Tachycardia present.     Heart sounds: No murmur.     Comments: Irregularly irregular rhythm Pulmonary:     Effort: Pulmonary effort is normal. No respiratory distress.     Breath sounds: Normal breath sounds.  Abdominal:     Palpations: Abdomen is soft.     Tenderness: There is no abdominal tenderness.  Musculoskeletal:     Right lower leg: He exhibits no tenderness. No edema.     Left lower leg: He exhibits no tenderness. No edema.  Skin:    General: Skin is warm and dry.  Neurological:     Mental Status: He is alert.      ED Treatments / Results  Labs (all labs ordered are listed, but only abnormal results are displayed) Labs Reviewed  BASIC METABOLIC PANEL - Abnormal; Notable for the following components:      Result Value   CO2 18 (*)    Glucose, Bld 122 (*)    Creatinine, Ser 1.25 (*)    GFR calc non Af Amer 57 (*)    All other components within normal limits  TROPONIN I (HIGH SENSITIVITY) - Abnormal; Notable for the following components:   Troponin I (High Sensitivity) 56 (*)    All other components within normal limits  TROPONIN I (HIGH SENSITIVITY) - Abnormal; Notable for the following components:   Troponin I (High Sensitivity) 132 (*)    All other components within normal limits  CBC    EKG EKG  Interpretation  Date/Time:  Friday September 07 2019 19:11:38 EST Ventricular Rate:  142 PR Interval:    QRS Duration: 86 QT Interval:  300 QTC Calculation: 461 R Axis:   -76 Text Interpretation: Sinus tachycardia Left axis deviation Nonspecific ST abnormality Abnormal ECG tachycardia new from previous Confirmed by Theotis Burrow [phone]) on 09/07/2019 10:12:48 PM   EKG Interpretation  Date/Time:  Friday September 07 2019 20:58:30 EST Ventricular Rate:  124 PR Interval:    QRS Duration: 86 QT Interval:  320 QTC Calculation: 459 R Axis:   -26 Text Interpretation: Atrial fibrillation with rapid ventricular response Abnormal ECG Confirmed by Merrily Pew [phone]) on 09/07/2019 11:05:07 PM      Post cardioversion EKG  Date: 09/08/2019, 1:02 AM  Rate:52  Rhythm: normal sinus rhythm  QRS Axis: borderline left axis deviation  Intervals: normal  ST/T Wave abnormalities: normal  Conduction Disutrbances: none  Old EKG Reviewed: compared to prior EKG today, afib with rvr resolved   Radiology Dg Chest 2 View  Result Date: 09/07/2019 CLINICAL DATA:  Chest pain EXAM: CHEST - 2 VIEW COMPARISON:  07/06/2019 FINDINGS: The heart size and mediastinal contours are within normal limits. Both lungs are clear. Surgical hardware in the cervical spine. IMPRESSION: No active cardiopulmonary disease. Electronically Signed   By: Donavan Foil M.D.   On: 09/07/2019 19:46    Procedures .Cardioversion  Date/Time: 09/08/2019 5:21 AM Performed by: Rodney Booze, PA-C Authorized by: Rodney Booze, PA-C   Consent:    Consent obtained:  Verbal   Consent given by:  Patient   Risks discussed:  Cutaneous burn, death, induced arrhythmia and pain   Alternatives discussed:  No treatment, alternative treatment and rate-control medication Pre-procedure details:    Cardioversion basis:  Emergent   Rhythm:  Atrial fibrillation (with rvr)   Electrode placement:  Anterior-lateral Patient sedated: Yes.  Refer to sedation procedure documentation for details of sedation.  Attempt one:    Cardioversion mode:  Synchronous   Shock (Joules):  120   Shock outcome:  Conversion to normal sinus rhythm Post-procedure details:    Patient status:  Awake   Patient tolerance of procedure:  Tolerated well, no immediate complications   (including critical care time) CRITICAL CARE Performed by: Rodney Booze   Total critical care time: 33 minutes  Critical care time was exclusive of separately billable procedures and treating other patients.  Critical care was necessary to treat or prevent imminent or life-threatening deterioration.  Critical care was time spent personally by me on the following activities: development of treatment plan with patient and/or surrogate as well as nursing, discussions with consultants, evaluation of patient's response to treatment, examination of patient, obtaining history from patient or surrogate, ordering and performing treatments and interventions, ordering and review of laboratory studies, ordering and review of radiographic studies, pulse oximetry and re-evaluation of patient's condition.   Medications Ordered in ED Medications  sodium chloride flush (NS) 0.9 % injection 3 mL (has no administration in time range)  etomidate (AMIDATE) injection 12.3 mg (12.3 mg Intravenous Not Given 09/08/19 0108)  aspirin chewable tablet 324 mg (324 mg Oral Given 09/07/19 2319)  sodium chloride 0.9 % bolus 1,000 mL (0 mLs Intravenous Stopped 09/07/19 2321)  diltiazem (CARDIZEM) injection 20 mg (20 mg Intravenous Given 09/07/19 2330)  lactated ringers bolus 1,000 mL (0 mLs Intravenous Stopped 09/08/19 0057)  lactated ringers bolus 1,000 mL (0 mLs Intravenous Stopped 09/08/19 0231)  etomidate (AMIDATE) injection (12.3 mg Intravenous Given 09/08/19 0058)  Initial Impression / Assessment and Plan / ED Course  I have reviewed the triage vital signs and the nursing notes.   Pertinent labs & imaging results that were available during my care of the patient were reviewed by me and considered in my medical decision making (see chart for details).     Final Clinical Impressions(s) / ED Diagnoses   Final diagnoses:  Atrial fibrillation with rapid ventricular response (HCC)  Atrial fibrillation status post cardioversion [facility])   73 y/o M with h/o afib (anticogulated) who presents to the ED for eval of chest pain and palpitations. He felt like he was in afib and took cardizem several hours PTA.  Cbc nonacute Bmp with low bicarb, otherwise reassuring Initial trop elevated at 56, delta increased to 128.   Initial EKG showed sinus tach. Repeat EKG showed afib with RVR.   CXR wnl.   Pt prefers to attempt chemical cardioversion. Pt given a dose of cardizem and IVF. On reassessment pt remains in afib with rvr, he is in agreement with proceeding with cardioversion.   1:00 AM Pt cardioverted and converted to NSR. Pt return to baseline following procedure. A&ox3. Following commands. Will consult cards.   Pt monitored in the ED following cardioversion and he remains stable in NSR.   Supervising physician, Dr. Dayna Barker, discussed case with cardiology who does not feel that repeat trop necessary. They recommend discharge with close f/u with cardiology.   Pt discharged in stable condition.   ED Discharge Orders    None       Bishop Dublin 09/08/19 1030    Merrily Pew, MD 09/08/19 [phone]

## 2019-09-08 MED ORDER — ETOMIDATE 2 MG/ML IV SOLN
INTRAVENOUS | Status: AC | PRN
  Administered 2019-09-08: 12.3 mg via INTRAVENOUS

## 2019-09-08 MED ORDER — LACTATED RINGERS IV BOLUS
1000.0000 mL | Freq: Once | INTRAVENOUS | Status: AC
  Administered 2019-09-08: 1000 mL via INTRAVENOUS

## 2019-09-08 NOTE — ED Notes (Signed)
Pt ambulated without assistance HR 60-75 while walking, NSR.

## 2019-09-08 NOTE — Sedation Documentation (Signed)
ED Provider at bedside. 

## 2019-09-08 NOTE — Sedation Documentation (Signed)
Vital signs stable. 

## 2019-09-08 NOTE — Progress Notes (Signed)
Cardiology [place] Note  Contacted by ED to help facilitate outpatient follow up for this patient who presented earlier with palpitations and chest pain in the setting of an acute episode of AF RVR. Patient successfully electrically cardioverted in ED with resolution of symptoms. Troponin found to be elevated, though this was thought to be myocardial injury due to demand from RVR. Patient does have a history of CAD and has previously undergone stenting of his RCA. Per review of his records, he does not currently have any issues with angina. His post-conversion ECG reveals sinus bradycardia with no ST or T wave changes suggestive of acute ischemia.  Will send note to the patient's cardiologist, Dr. Acie Fredrickson, to contact patient for follow up scheduling and to determine whether there is a need for ischemic evaluation.   Marcie Mowers, MD Cardiology Fellow, PGY-7

## 2019-09-08 NOTE — ED Provider Notes (Signed)
Here with paroxysmal afib (started exactly at 1615). Tried taking a 30mg  diltiazem and did not work. Initially had CP, now resolved. Slightly elevated troponin.  Patient is HDS. Prefers to attempt medical cardioversion prior to attempt at electrical cardioversion.   Physical Exam  BP 115/80   Pulse (!) 50   Temp 98.5 F (36.9 C) (Oral)   Resp 14   Ht 6' 1.5" (1.867 m)   Wt 122.9 kg   SpO2 97%   BMI 35.27 kg/m   Physical Exam  ED Course/Procedures     .Sedation  Date/Time: 09/08/2019 6:08 AM Performed by: Merrily Pew, MD Authorized by: Merrily Pew, MD   Consent:    Consent obtained:  Verbal   Consent given by:  Patient   Risks discussed:  Allergic reaction, dysrhythmia, inadequate sedation, nausea, prolonged hypoxia resulting in organ damage, prolonged sedation necessitating reversal, respiratory compromise necessitating ventilatory assistance and intubation and vomiting   Alternatives discussed:  Analgesia without sedation, anxiolysis and regional anesthesia Universal protocol:    Procedure explained and questions answered to patient or proxy's satisfaction: yes     Relevant documents present and verified: yes     Test results available and properly labeled: yes     Imaging studies available: yes     Required blood products, implants, devices, and special equipment available: yes     Site/side marked: yes     Immediately prior to procedure a time out was called: yes     Patient identity confirmation method:  Verbally with patient and arm band Indications:    Procedure performed:  Cardioversion   Procedure necessitating sedation performed by:  Physician performing sedation Pre-sedation assessment:    Time since last food or drink:  >4 hours   ASA classification: class 1 - normal, healthy patient     Neck mobility: normal     Mouth opening:  3 or more finger widths   Thyromental distance:  4 finger widths   Mallampati score:  I - soft palate, uvula, fauces, pillars  visible   Pre-sedation assessments completed and reviewed: airway patency, cardiovascular function, hydration status, mental status, nausea/vomiting, pain level, respiratory function and temperature   Immediate pre-procedure details:    Reassessment: Patient reassessed immediately prior to procedure     Reviewed: vital signs, relevant labs/tests and NPO status     Verified: bag valve mask available, emergency equipment available, intubation equipment available, IV patency confirmed, oxygen available and suction available   Procedure details (see MAR for exact dosages):    Preoxygenation:  Nasal cannula   Sedation:  Etomidate   Intended level of sedation: deep   Intra-procedure monitoring:  Blood pressure monitoring, cardiac monitor, continuous pulse oximetry, frequent LOC assessments, frequent vital sign checks and continuous capnometry   Intra-procedure events: none     Intra-procedure management:  Airway repositioning   Total Provider sedation time (minutes):  21 Post-procedure details:    Attendance: Constant attendance by certified staff until patient recovered     Recovery: Patient returned to pre-procedure baseline     Post-sedation assessments completed and reviewed: airway patency, cardiovascular function, hydration status, mental status, nausea/vomiting, pain level, respiratory function and temperature     Patient is stable for discharge or admission: yes     Patient tolerance:  Tolerated well, no immediate complications  .Critical Care Performed by: Merrily Pew, MD Authorized by: Merrily Pew, MD   Critical care provider statement:    Critical care time (minutes):  45   Critical care  was necessary to treat or prevent imminent or life-threatening deterioration of the following conditions:  Cardiac failure   Critical care was time spent personally by me on the following activities:  Discussions with consultants, evaluation of patient's response to treatment, examination of  patient, ordering and performing treatments and interventions, ordering and review of laboratory studies, ordering and review of radiographic studies, pulse oximetry, re-evaluation of patient's condition, obtaining history from patient or surrogate and review of old charts    EKG Interpretation  Date/Time:  Friday September 07 2019 20:58:30 EST Ventricular Rate:  124 PR Interval:    QRS Duration: 86 QT Interval:  320 QTC Calculation: 459 R Axis:   -26 Text Interpretation: Atrial fibrillation with rapid ventricular response Abnormal ECG Confirmed by Merrily Pew [phone]) on 09/07/2019 11:05:07 PM       EKG Interpretation  Date/Time:  Saturday September 08 2019 01:02:33 EST Ventricular Rate:  52 PR Interval:    QRS Duration: 111 QT Interval:  440 QTC Calculation: 410 R Axis:   -20 Text Interpretation: Sinus rhythm Borderline left axis deviation improved from Afib RVR Confirmed by Merrily Pew [phone]) on 09/08/2019 6:09:23 AM         MDM  Patient was in sinus bradycardia after the cardioversion but able to ambulate without lightheadedness or near syncope.  He did have a couple elevated troponins but think this is demand ischemia secondary to the RVR rather than primary acs as he is cp free and it was originally likely there because of type II ischemia. Discussed with cardiology (no formal consult) who will set up close outpatient follow up for both. ambuated and symptom free at time of dischare. Merrily Pew, MD 09/08/19 [phone]

## 2019-09-08 NOTE — Sedation Documentation (Signed)
Successful cardioversion completed. Pt sedated with 12.3 mg etomidate and shocked one time with 120 j with a cardiac conversion to SB. Pt VSS remained stable throughout procedure and pt able to follow commands and AOx4 at sedation end time.

## 2019-09-08 NOTE — ED Notes (Signed)
Patient verbalizes understanding of discharge instructions. Opportunity for questioning and answers were provided. Armband removed by staff, pt discharged from ED ambulatory.   

## 2019-09-08 NOTE — Sedation Documentation (Signed)
First shock 120 j given

## 2019-09-10 ENCOUNTER — Encounter (HOSPITAL_COMMUNITY): Payer: Self-pay | Admitting: Physician Assistant

## 2019-09-10 ENCOUNTER — Other Ambulatory Visit: Payer: Self-pay

## 2019-09-10 ENCOUNTER — Ambulatory Visit (HOSPITAL_COMMUNITY)
Admission: RE | Admit: 2019-09-10 | Discharge: 2019-09-10 | Disposition: A | Discharge status: Home / Self Care | Payer: Medicare Other | Source: Ambulatory Visit | Attending: Physician Assistant | Admitting: Physician Assistant

## 2019-09-10 VITALS — BP 140/76 | HR 73 | Ht 73.5 in | Wt 273.4 lb

## 2019-09-10 DIAGNOSIS — Z880 Allergy status to penicillin: Secondary | ICD-10-CM | POA: Insufficient documentation

## 2019-09-10 DIAGNOSIS — Z6835 Body mass index (BMI) 35.0-35.9, adult: Secondary | ICD-10-CM | POA: Insufficient documentation

## 2019-09-10 DIAGNOSIS — I10 Essential (primary) hypertension: Secondary | ICD-10-CM | POA: Diagnosis not present

## 2019-09-10 DIAGNOSIS — D6869 Other thrombophilia: Secondary | ICD-10-CM | POA: Insufficient documentation

## 2019-09-10 DIAGNOSIS — E669 Obesity, unspecified: Secondary | ICD-10-CM | POA: Insufficient documentation

## 2019-09-10 DIAGNOSIS — M4322 Fusion of spine, cervical region: Secondary | ICD-10-CM | POA: Diagnosis not present

## 2019-09-10 DIAGNOSIS — M1711 Unilateral primary osteoarthritis, right knee: Secondary | ICD-10-CM | POA: Diagnosis not present

## 2019-09-10 DIAGNOSIS — Z79899 Other long term (current) drug therapy: Secondary | ICD-10-CM | POA: Insufficient documentation

## 2019-09-10 DIAGNOSIS — E785 Hyperlipidemia, unspecified: Secondary | ICD-10-CM | POA: Diagnosis not present

## 2019-09-10 DIAGNOSIS — M19079 Primary osteoarthritis, unspecified ankle and foot: Secondary | ICD-10-CM | POA: Insufficient documentation

## 2019-09-10 DIAGNOSIS — I480 Paroxysmal atrial fibrillation: Secondary | ICD-10-CM | POA: Insufficient documentation

## 2019-09-10 DIAGNOSIS — Z881 Allergy status to other antibiotic agents status: Secondary | ICD-10-CM | POA: Insufficient documentation

## 2019-09-10 DIAGNOSIS — Z7901 Long term (current) use of anticoagulants: Secondary | ICD-10-CM | POA: Diagnosis not present

## 2019-09-10 DIAGNOSIS — M19049 Primary osteoarthritis, unspecified hand: Secondary | ICD-10-CM | POA: Diagnosis not present

## 2019-09-10 DIAGNOSIS — M47812 Spondylosis without myelopathy or radiculopathy, cervical region: Secondary | ICD-10-CM | POA: Insufficient documentation

## 2019-09-10 DIAGNOSIS — Z8673 Personal history of transient ischemic attack (TIA), and cerebral infarction without residual deficits: Secondary | ICD-10-CM | POA: Diagnosis not present

## 2019-09-10 DIAGNOSIS — Z87891 Personal history of nicotine dependence: Secondary | ICD-10-CM | POA: Insufficient documentation

## 2019-09-10 DIAGNOSIS — K219 Gastro-esophageal reflux disease without esophagitis: Secondary | ICD-10-CM | POA: Insufficient documentation

## 2019-09-10 DIAGNOSIS — G4733 Obstructive sleep apnea (adult) (pediatric): Secondary | ICD-10-CM | POA: Diagnosis not present

## 2019-09-10 DIAGNOSIS — I080 Rheumatic disorders of both mitral and aortic valves: Secondary | ICD-10-CM | POA: Diagnosis not present

## 2019-09-10 DIAGNOSIS — I2510 Atherosclerotic heart disease of native coronary artery without angina pectoris: Secondary | ICD-10-CM | POA: Insufficient documentation

## 2019-09-10 DIAGNOSIS — Z8249 Family history of ischemic heart disease and other diseases of the circulatory system: Secondary | ICD-10-CM | POA: Insufficient documentation

## 2019-09-10 DIAGNOSIS — Z96652 Presence of left artificial knee joint: Secondary | ICD-10-CM | POA: Insufficient documentation

## 2019-09-10 DIAGNOSIS — M47819 Spondylosis without myelopathy or radiculopathy, site unspecified: Secondary | ICD-10-CM | POA: Insufficient documentation

## 2019-09-10 DIAGNOSIS — Z885 Allergy status to narcotic agent status: Secondary | ICD-10-CM | POA: Diagnosis not present

## 2019-09-10 DIAGNOSIS — R9431 Abnormal electrocardiogram [ECG] [EKG]: Secondary | ICD-10-CM | POA: Insufficient documentation

## 2019-09-10 DIAGNOSIS — I4510 Unspecified right bundle-branch block: Secondary | ICD-10-CM | POA: Insufficient documentation

## 2019-09-10 NOTE — Telephone Encounter (Signed)
Pt notified to stop hctz 3 days prior to hospitalization.

## 2019-09-10 NOTE — Patient Instructions (Signed)
The day you go for your COVID test is the last day you can take the HCTZ.

## 2019-09-10 NOTE — Progress Notes (Addendum)
Primary Care Physician: [name], MD Primary Cardiologist: Dr Acie Fredrickson Primary Electrophysiologist: none Referring Physician: Dr Deatra Canter is a 73 y.o. male with a history of CAD, HTN, OSA, CVA, paroxysmal atrial fibrillation, and HLD who presents for consultation in the [facility].  The patient was initially diagnosed with atrial fibrillation in 2016 after presenting with symptoms of presyncope. He converted on IV diltiazem and has been maintained on Xarelto. Patient had done well for years until 07/06/19 when he presented to the ER with symptoms of palpitations and chest pressure and found to be in afib vs atrial flutter with RVR. He converted while being evaluated in the ER and his symptoms resolved. Since then, he has not had any further symptoms. He denies any specific triggers that he could identify. He has a diagnosis of OSA and is compliant with his CPAP. He denies significant alcohol use. He is on Xarelto for a CHADS2VASC score of 5.  On follow up today, patient had another episode of afib on 09/07/19. There were no specific triggers that the patient could identify. He took the PRN diltiazem without much relief. He presented to the ER in afib with RVR and underwent successful DCCV. He has felt well since then.  Today, he denies symptoms of palpitations, chest pain, shortness of breath, orthopnea, PND, lower extremity edema, dizziness, presyncope, syncope, snoring, daytime somnolence, bleeding, or neurologic sequela. The patient is tolerating medications without difficulties and is otherwise without complaint today.    Atrial Fibrillation Risk Factors:  he does have symptoms or diagnosis of sleep apnea. he is compliant with CPAP therapy. he does not have a history of rheumatic fever. he does not have a history of alcohol use. The patient does not have a history of early familial atrial fibrillation or other arrhythmias.  he has a BMI of Body  mass index is 35.58 kg/m.Marland Kitchen Filed Weights   09/10/19 1501  Weight: 124 kg    Family History  Problem Relation Age of Onset  . Hypertension Mother   . Heart disease Father   . Heart attack Father      Atrial Fibrillation Management history:  Previous antiarrhythmic drugs: none Previous cardioversions: 09/07/19 Previous ablations: none CHADS2VASC score: 5 Anticoagulation history: Xarelto    Past Medical History:  Diagnosis Date  . Arthritis    "back, right knee, hands, ankles, neck" (05/31/2016)  . Atrial fibrillation with RVR ([place])   . Carotid artery disease ([place]) 08/30/2016   Carotid US 11/19: R 1-39, L 40-59 // Carotid US 08/2019: R 1-39; L 40-59; repeat 1 year  . Chronic lower back pain   . Coronary atherosclerosis of native coronary artery    a. BMS to [facility] 2004 and 2007, otherwise mild nonobstructive disease. EF normal.  . Diverticulitis   . Dyslipidemia   . Essential hypertension, benign   . GERD (gastroesophageal reflux disease)   . Lumbar radiculopathy, chronic 02/04/2015   Right L5  . Obesity   . OSA on CPAP   . Paroxysmal atrial fibrillation ([place])    a. Discovered after stroke.  . Pneumonia 01/1996  . PONV (postoperative nausea and vomiting)   . Recurrent upper respiratory infection (URI)    Past Surgical History:  Procedure Laterality Date  . ANTERIOR CERVICAL DECOMP/DISCECTOMY FUSION  07/2001; 10/2002   "C5-6; C6-7; redo"  . BACK SURGERY    . CARPAL TUNNEL RELEASE Left 10/2015  . COLONOSCOPY W/ POLYPECTOMY  02/2014  . CORONARY  ANGIOPLASTY WITH STENT PLACEMENT  05/2003; 12/2005   "mid RCA; mid RCA"  . JOINT REPLACEMENT    . KNEE ARTHROSCOPY Left 10/2005  . KNEE ARTHROSCOPY W/ PARTIAL MEDIAL MENISCECTOMY Left 09/2005  . LUMBAR LAMINECTOMY/DECOMPRESSION MICRODISCECTOMY  03/2005   "L4-5"  . POSTERIOR LUMBAR FUSION  10/2003   L5-S1; "plates, screws"  . SHOULDER ARTHROSCOPY Right 08/2011   Debridement of labrum, arthroscopic distal clavicle excision  .  SHOULDER OPEN ROTATOR CUFF REPAIR Left 07/2014  . TEE WITHOUT CARDIOVERSION  07/07/2012   Procedure: TRANSESOPHAGEAL ECHOCARDIOGRAM (TEE);  Surgeon: Fay Records, MD;  Location: [facility];  Service: Cardiovascular;  Laterality: N/A;  . TONSILLECTOMY AND ADENOIDECTOMY  ~ 1956  . TOTAL KNEE ARTHROPLASTY Left 10/2006  . TRIGGER FINGER RELEASE Left 10/2015    Current Outpatient Medications  Medication Sig Dispense Refill  . acetaminophen (TYLENOL 8 HOUR ARTHRITIS PAIN) 650 MG CR tablet Take 1,300 mg by mouth daily as needed for pain (pain).     Marland Kitchen ALPRAZolam (XANAX) 0.5 MG tablet Take 0.5 mg by mouth at bedtime as needed.  1  . atorvastatin (LIPITOR) 40 MG tablet Take 1 tablet (40 mg total) by mouth daily. 90 tablet 3  . diltiazem (CARDIZEM) 30 MG tablet Take 1 tablet (30 mg total) by mouth 4 (four) times daily as needed. 60 tablet 6  . fenofibrate 160 MG tablet Take 1 tablet (160 mg total) by mouth daily. 90 tablet 3  . finasteride (PROSCAR) 5 MG tablet finasteride  5 mg tabs    . fluticasone (FLONASE) 50 MCG/ACT nasal spray Place 1 spray into the nose daily.    . hydrochlorothiazide (HYDRODIURIL) 25 MG tablet Take 1 tablet (25 mg total) by mouth daily. 90 tablet 3  . levocetirizine (XYZAL) 5 MG tablet Take 5 mg by mouth every evening.    Marland Kitchen lisinopril (ZESTRIL) 10 MG tablet lisinopril  10 mg tabs    . metoprolol succinate (TOPROL-XL) 50 MG 24 hr tablet Take 2 tablets (100 mg total) by mouth daily. Take with or immediately following a meal. 180 tablet 3  . Multiple Vitamins-Minerals (MULTIVITAMINS THER. W/MINERALS) TABS Take 1 tablet by mouth daily.      . nitroGLYCERIN (NITROSTAT) 0.4 MG SL tablet Place 1 tablet (0.4 mg total) under the tongue every 5 (five) minutes as needed for chest pain. 25 tablet 5  . omeprazole (PRILOSEC) 10 MG capsule Take 1 capsule (10 mg total) by mouth daily. 90 capsule 3  . potassium chloride (K-DUR) 10 MEQ tablet Take 1 tablet (10 mEq total) by mouth daily. 90 tablet  3  . Probiotic Product (PROBIOTIC PO) Take 1 capsule by mouth daily.     . sildenafil (REVATIO) 20 MG tablet Take 2 to 5 tabs daily as needed for erectile dysfunction 30 tablet 2  . Tamsulosin HCl (FLOMAX) 0.4 MG CAPS Take 0.4 mg by mouth daily.      Marland Kitchen topiramate (TOPAMAX) 50 MG tablet Take 1 tablet (50 mg total) by mouth 2 (two) times daily. 60 tablet 2  . XARELTO 20 MG TABS tablet TAKE 1 TABLET BY MOUTH EVERY DAY 90 tablet 1   No current facility-administered medications for this encounter.     Allergies  Allergen Reactions  . Amoxicillin Swelling  . Fosinopril Other (See Comments)  . Ampicillin Rash  . Codeine Nausea Only and Other (See Comments)    Heavy amounts cause nausea  . Monopril [Fosinopril Sodium] Other (See Comments)    Muscle aches & pain  .  Dilaudid [Hydromorphone Hcl] Nausea Only  . Percocet [Oxycodone-Acetaminophen] Nausea And Vomiting    Social History   Socioeconomic History  . Marital status: Married    Spouse name: [name]  . Number of children: 3  . Years of education: 89  . Highest education level: Not on file  Occupational History  . Occupation: Retired    Fish farm manager: DISABLED     Comment: Works at [employer] part time  Social Needs  . Financial resource strain: Not on file  . Food insecurity    Worry: Not on file    Inability: Not on file  . Transportation needs    Medical: Not on file    Non-medical: Not on file  Tobacco Use  . Smoking status: Former Smoker    Packs/day: 1.00    Years: 34.00    Pack years: 34.00    Types: Cigarettes    Quit date: 05/05/2003    Years since quitting: 16.3  . Smokeless tobacco: Never Used  Substance and Sexual Activity  . Alcohol use: No    Alcohol/week: 0.0 standard drinks  . Drug use: No  . Sexual activity: Yes  Lifestyle  . Physical activity    Days per week: Not on file    Minutes per session: Not on file  . Stress: Not on file  Relationships  . Social Herbalist on phone: Not on file     Gets together: Not on file    Attends religious service: Not on file    Active member of club or organization: Not on file    Attends meetings of clubs or organizations: Not on file    Relationship status: Not on file  . Intimate partner violence    Fear of current or ex partner: Not on file    Emotionally abused: Not on file    Physically abused: Not on file    Forced sexual activity: Not on file  Other Topics Concern  . Not on file  Social History Narrative   Patient lives at home with spouse.   Caffeine Use:      ROS- All systems are reviewed and negative except as per the HPI above.  Physical Exam: Vitals:   09/10/19 1501  BP: 140/76  Pulse: 73  Weight: 124 kg  Height: 6' 1.5" (1.867 m)    GEN- The patient is well appearing obese male, alert and oriented x 3 today.   HEENT-head normocephalic, atraumatic, sclera clear, conjunctiva pink, hearing intact, trachea midline. Lungs- Clear to ausculation bilaterally, normal work of breathing Heart- Regular rate and rhythm, no murmurs, rubs or gallops  GI- soft, NT, ND, + BS Extremities- no clubbing, cyanosis, or edema MS- no significant deformity or atrophy Skin- no rash or lesion Psych- euthymic mood, full affect Neuro- strength and sensation are intact   Wt Readings from Last 3 Encounters:  09/10/19 124 kg  09/07/19 122.9 kg  07/19/19 122.9 kg    EKG today demonstrates SR HR 73, in RBBB, LAD, PR 178, QRS 104, QTc 447  Echo 07/19/19 demonstrated   1. Left ventricular ejection fraction, by visual estimation, is 60 to 65%. The left ventricle has normal function. Normal left ventricular size. There is mildly increased left ventricular hypertrophy.  2. Definity contrast agent was given IV to delineate the left ventricular endocardial borders.  3. Global right ventricle has normal systolic function.The right ventricular size is normal. No increase in right ventricular wall thickness.  4. Left atrial size was  mildly  dilated.  5. Right atrial size was normal.  6. The mitral valve is normal in structure. Trace mitral valve regurgitation. No evidence of mitral stenosis.  7. The tricuspid valve is normal in structure. Tricuspid valve regurgitation was not visualized by color flow Doppler.  8. The aortic valve is tricuspid Aortic valve regurgitation was not visualized by color flow Doppler. Mild aortic valve sclerosis without stenosis.  9. The pulmonic valve was normal in structure. Pulmonic valve regurgitation is not visualized by color flow Doppler. 10. The inferior vena cava is normal in size with greater than 50% respiratory variability, suggesting right atrial pressure of 3 mmHg.  Epic records are reviewed at length today  Assessment and Plan:  1. Paroxysmal atrial fibrillation Patient wants to pursue dofetilide, aware of risk vrs benefit Aware of price of dofetilide. No benadryl use PharmD has screened drugs and patient will stop HCTZ 3 days prior to admission.  QTc in SR 431-447 ms, Continue Toprol 100 mg BID Continue Xarelto 20 mg daily. Patient reports no missed doses. Continue diltiazem 30 mg PRN q4hrs for heart racing.  This patients CHA2DS2-VASc Score and unadjusted Ischemic Stroke Rate (% per year) is equal to 7.2 % stroke rate/year from a score of 5  Above score calculated as 1 point each if present [CHF, HTN, DM, Vascular=MI/PAD/Aortic Plaque, Age if 65-74, or Male] Above score calculated as 2 points each if present [Age > 75, or Stroke/TIA/TE]   2. Obesity Body mass index is 35.58 kg/m. Lifestyle modification was discussed and encouraged including regular physical activity and weight reduction.  3. Obstructive sleep apnea The importance of adequate treatment of sleep apnea was discussed today in order to improve our ability to maintain sinus rhythm long term. Patient reports compliance with CPAP therapy.  4. CAD No anginal symptoms. Followed by Dr Acie Fredrickson.  5. HTN Stable, pt  will need to stop HCTZ 3 days prior to admission.   Follow up with Dr Acie Fredrickson as scheduled. Patient to come in next week for dofetilide admission.   [facility] [address] [place], [place] [address] [phone] 09/10/2019 3:31 PM

## 2019-09-11 ENCOUNTER — Encounter: Payer: Self-pay | Admitting: Cardiovascular Disease

## 2019-09-11 ENCOUNTER — Encounter: Payer: Self-pay | Admitting: Nurse Practitioner

## 2019-09-11 ENCOUNTER — Ambulatory Visit: Payer: Medicare Other | Admitting: Cardiovascular Disease

## 2019-09-11 VITALS — BP 132/72 | HR 70 | Ht 73.0 in | Wt 273.0 lb

## 2019-09-11 DIAGNOSIS — I6523 Occlusion and stenosis of bilateral carotid arteries: Secondary | ICD-10-CM

## 2019-09-11 DIAGNOSIS — I2510 Atherosclerotic heart disease of native coronary artery without angina pectoris: Secondary | ICD-10-CM | POA: Diagnosis not present

## 2019-09-11 DIAGNOSIS — I480 Paroxysmal atrial fibrillation: Secondary | ICD-10-CM | POA: Diagnosis not present

## 2019-09-11 DIAGNOSIS — R0789 Other chest pain: Secondary | ICD-10-CM | POA: Diagnosis not present

## 2019-09-11 NOTE — Patient Instructions (Addendum)
Medication Instructions:  Your physician recommends that you continue on your current medications as directed. Please refer to the Current Medication list given to you today.  *If you need a refill on your cardiac medications before your next appointment, please call your pharmacy*   Lab Work: None Ordered   Testing/Procedures: Your physician has requested that you have a 2-day lexiscan myoview in 3-4 weeks. For further information please visit [email or web address]. Please follow instruction sheet, as given.    Follow-Up: At [facility], you and your health needs are our priority.  As part of our continuing mission to provide you with exceptional heart care, we have created designated Provider Care Teams.  These Care Teams include your primary Cardiologist (physician) and Advanced Practice Providers (APPs -  Physician Assistants and Nurse Practitioners) who all work together to provide you with the care you need, when you need it.  Your next appointment:   6 month(s)  The format for your next appointment:   In Person  Provider:   You may see Mertie Moores, MD or one of the following Advanced Practice Providers on your designated Care Team:    Richardson Dopp, PA-C  [place], [place]  Daune Perch, [place]

## 2019-09-11 NOTE — Progress Notes (Signed)
Cardiology Office Note   Date:  09/11/2019   ID:  Corey [name], DOB 1945-10-15, MRN [id number]  PCP:  [name], MD  Cardiologist:   Mertie Moores, MD   Chief Complaint  Patient presents with   Coronary Artery Disease   Atrial Fibrillation   Problem List: 1.  . Coronary artery disease-status post PTCA and stenting of his right coronary artery 2. Hypertension 3. Hyperlipidemia 4. 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 Atrial fibrillation - discovered after his stroke    Corey [name] is a 73 yo gentleman with a history of coronary artery disease. Status post PTCA and stenting of his right coronary artery. He also has a history of hypertension, and hyperlipidemia. 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  He had a stroke in Oct. He had some speech problems and loss of fine motor control of the right arm. TEE was negative for PFO or LAA thrombus. Event monitor showed no A-Fib. He has been on Plavix since his stents in  January 05, 2013:  Corey [name] had an episode of atrial fib in January. He was started on Xarelto and has done well.  He has had some fatigue on occasion. He monitors his BP.   He has recovered from his stroke.   Feb. 13, 2015:  Corey [name] is doing ok. Still having touble losing weight. Has had a URI for the past month or so. No CP, no palps. Has recovered well from his CVA. He is looking forward to being able to exercise more   December 23, 2014: Corey [name] is a 73 y.o. male who presents following recent hospitalization for rapid atrial fib. He presented to the hospital with syncope /presyncope.  He was found to have rapid atrial fibrillation.  Was started on IV diltiazem and converted by the next am. He has maintained NSR. Has gained some weight due to inactivity.    07/10/2015:  Corey [name] is doing well.   No CP , no dyspnea.  No HR irregularities.   Has had rapid atrial fib in March , 2016 . Is no Xarelto   January 05, 2016:  Corey [name] is  seen back for CAD and HTN.and PAF Also has anemia and has been found to have low iron.  Has had a colonoscopy ~ 2 years ago.   Negative stool guiac.   Nov. 27, 2017: Corey [name] was admitted to the hospital in Sept. 2017 with diverticulitis. Doing well.   No CP or dyspnea.  Due for his carotid duplex - has mild Carotid artery disease.   September 20, 2017:   Corey [name] is seen today for follow-up visit. His twin brother was diagnosed with parkinsons recently . hes worried about that.   Going to see a neurologist.  Gets fatigued on occasion.    No angina .   Exercising some.   Has some back issues.  Is trying to lose some weight .  Occasionally has some left leg swelling   Dec. 18, 2019 Corey [name] was seen today for follow-up of his coronary artery disease, PAF,  hypertension, hyperlipidemia. Has had some orthostasis  Has lost some weight .  Wt = 270 lbs today  Exercising more,   Needs left hand surgery in January .  Needs cardiac clearance  Is on Xarelto for PAF  - will hold for 2 days prior to hand surgery    Has stable carotid artery disease  Repeat carotid duplex is ordered for  next year    July 10, 2019:  Corey [name] is seen today for follow-up of his coronary artery disease, paroxysmal atrial fibrillation, hypertension, hyperlipidemia  He was seen for telemedicine visit about a month ago.   He started him on HCTZ and potassium at that time.    He was seen in the emergency room several days ago with episodes of palpitations and was found to have atrial fibrillation.  Was getting prepped for cardioversion but then he converted on its own.   This is 2nd episode of AF.  Uses CPAP.  No ETOH.   Encouraged weight loss       Dec. 8, 2020:  Had an episode of PAF this past weekend.   Took a cardizem and the AF resolved.  Took a Diltiazem.   Waited another hour.  Call our on call .     went to [place].   Received IV Dilt.  Slowed the AF ,  Then he had a DC cardioversion  Went to the AF clinic  yesterday  The plan is to go with Tikosyn.  (admission is schedlued) Going in for admission on Tuesday ,  Had some chest pain when his HR is fast.   Past Medical History:  Diagnosis Date   Arthritis    "back, right knee, hands, ankles, neck" (05/31/2016)   Atrial fibrillation with RVR ([place])    Carotid artery disease ([place]) 08/30/2016   Carotid US 11/19: R 1-39, L 40-59 // Carotid US 08/2019: R 1-39; L 40-59; repeat 1 year   Chronic lower back pain    Coronary atherosclerosis of native coronary artery    a. BMS to mRCA 2004 and 2007, otherwise mild nonobstructive disease. EF normal.   Diverticulitis    Dyslipidemia    Essential hypertension, benign    GERD (gastroesophageal reflux disease)    Lumbar radiculopathy, chronic 02/04/2015   Right L5   Obesity    OSA on CPAP    Paroxysmal atrial fibrillation (HCC)    a. Discovered after stroke.   Pneumonia 01/1996   PONV (postoperative nausea and vomiting)    Recurrent upper respiratory infection (URI)     Past Surgical History:  Procedure Laterality Date   ANTERIOR CERVICAL DECOMP/DISCECTOMY FUSION  07/2001; 10/2002   "C5-6; C6-7; redo"   BACK SURGERY     CARPAL TUNNEL RELEASE Left 10/2015   COLONOSCOPY W/ POLYPECTOMY  02/2014   CORONARY ANGIOPLASTY WITH STENT PLACEMENT  05/2003; 12/2005   "mid RCA; mid RCA"   JOINT REPLACEMENT     KNEE ARTHROSCOPY Left 10/2005   KNEE ARTHROSCOPY W/ PARTIAL MEDIAL MENISCECTOMY Left 09/2005   LUMBAR LAMINECTOMY/DECOMPRESSION MICRODISCECTOMY  03/2005   "L4-5"   POSTERIOR LUMBAR FUSION  10/2003   L5-S1; "plates, screws"   SHOULDER ARTHROSCOPY Right 08/2011   Debridement of labrum, arthroscopic distal clavicle excision   SHOULDER OPEN ROTATOR CUFF REPAIR Left 07/2014   TEE WITHOUT CARDIOVERSION  07/07/2012   Procedure: TRANSESOPHAGEAL ECHOCARDIOGRAM (TEE);  Surgeon: Fay Records, MD;  Location: [facility];  Service: Cardiovascular;  Laterality: N/A;   TONSILLECTOMY AND  ADENOIDECTOMY  ~ [place] Left 10/2006   TRIGGER FINGER RELEASE Left 10/2015     Current Outpatient Medications  Medication Sig Dispense Refill   acetaminophen (TYLENOL 8 HOUR ARTHRITIS PAIN) 650 MG CR tablet Take 1,300 mg by mouth daily as needed for pain (pain).      ALPRAZolam (XANAX) 0.5 MG tablet Take 0.5 mg by mouth at  bedtime as needed.  1   atorvastatin (LIPITOR) 40 MG tablet Take 1 tablet (40 mg total) by mouth daily. 90 tablet 3   diltiazem (CARDIZEM) 30 MG tablet Take 1 tablet (30 mg total) by mouth 4 (four) times daily as needed. 60 tablet 6   fenofibrate 160 MG tablet Take 1 tablet (160 mg total) by mouth daily. 90 tablet 3   finasteride (PROSCAR) 5 MG tablet finasteride  5 mg tabs     fluticasone (FLONASE) 50 MCG/ACT nasal spray Place 1 spray into the nose daily.     hydrochlorothiazide (HYDRODIURIL) 25 MG tablet Take 1 tablet (25 mg total) by mouth daily. 90 tablet 3   levocetirizine (XYZAL) 5 MG tablet Take 5 mg by mouth every evening.     lisinopril (ZESTRIL) 10 MG tablet lisinopril  10 mg tabs     metoprolol succinate (TOPROL-XL) 50 MG 24 hr tablet Take 2 tablets (100 mg total) by mouth daily. Take with or immediately following a meal. 180 tablet 3   Multiple Vitamins-Minerals (MULTIVITAMINS THER. W/MINERALS) TABS Take 1 tablet by mouth daily.       nitroGLYCERIN (NITROSTAT) 0.4 MG SL tablet Place 1 tablet (0.4 mg total) under the tongue every 5 (five) minutes as needed for chest pain. 25 tablet 5   omeprazole (PRILOSEC) 10 MG capsule Take 1 capsule (10 mg total) by mouth daily. 90 capsule 3   potassium chloride (K-DUR) 10 MEQ tablet Take 1 tablet (10 mEq total) by mouth daily. 90 tablet 3   Probiotic Product (PROBIOTIC PO) Take 1 capsule by mouth daily.      sildenafil (REVATIO) 20 MG tablet Take 2 to 5 tabs daily as needed for erectile dysfunction 30 tablet 2   Tamsulosin HCl (FLOMAX) 0.4 MG CAPS Take 0.4 mg by mouth daily.        topiramate (TOPAMAX) 50 MG tablet Take 1 tablet (50 mg total) by mouth 2 (two) times daily. 60 tablet 2   XARELTO 20 MG TABS tablet TAKE 1 TABLET BY MOUTH EVERY DAY 90 tablet 1   No current facility-administered medications for this visit.     Allergies:   Amoxicillin, Fosinopril, Ampicillin, Codeine, Monopril [fosinopril sodium], Dilaudid [hydromorphone hcl], and Percocet [oxycodone-acetaminophen]    Social History:  The patient  reports that he quit smoking about 16 years ago. His smoking use included cigarettes. He has a 34.00 pack-year smoking history. He has never used smokeless tobacco. He reports that he does not drink alcohol or use drugs.   Family History:  The patient's family history includes Heart attack in his father; Heart disease in his father; Hypertension in his mother.    ROS: As per current history otherwise negative.  Physical Exam: Blood pressure 132/72, pulse 70, height 6\' 1"  (1.854 m), weight 273 lb (123.8 kg), SpO2 100 %.  GEN:   Middle age, obese male , NAD  HEENT: Normal NECK: No JVD; No carotid bruits LYMPHATICS: No lymphadenopathy CARDIAC: RRR ,  RESPIRATORY:  Clear to auscultation without rales, wheezing or rhonchi  ABDOMEN: Soft, non-tender, non-distended MUSCULOSKELETAL:  No edema; No deformity  SKIN: Warm and dry NEUROLOGIC:  Alert and oriented x 3   EKG:    Recent Labs: 09/20/2018: ALT 37 07/10/2019: TSH 2.310 09/07/2019: BUN 22; Creatinine, Ser 1.25; Hemoglobin 14.8; Platelets 265; Potassium 3.8; Sodium 136    Lipid Panel    Component Value Date/Time   CHOL 129 09/20/2018 1004   CHOL 108 01/05/2013 0748   TRIG 81  09/20/2018 1004   TRIG 84 01/05/2013 0748   HDL 38 (L) 09/20/2018 1004   HDL 29 (L) 01/05/2013 0748   CHOLHDL 3.4 09/20/2018 1004   CHOLHDL 3.7 08/30/2016 0840   VLDL 21 08/30/2016 0840   LDLCALC 75 09/20/2018 1004   LDLCALC 62 01/05/2013 0748      Wt Readings from Last 3 Encounters:  09/11/19 273 lb (123.8 kg)   09/10/19 273 lb 6.4 oz (124 kg)  09/07/19 271 lb (122.9 kg)      Other studies Reviewed: Additional studies/ records that were reviewed today include: . Review of the above records demonstrates:    ASSESSMENT AND PLAN:  1.  Coronary artery disease-    [place] had stenting back in 2007.  He recently had an episode of paroxysmal atrial fibrillation and while his heart rate was going fast, he had some angina.  Was ultimately checked out at the emergency room and his troponin levels were mildly elevated.  While I do agree that this was due to his atrial fibrillation with rapid ventricular response, it could also indicate some underlying worsening coronary artery disease.  He is scheduled to go in for a Tikosyn admission next week.  We will schedule him for a 2-day K[place]va study in approximately 2 weeks.  2. Hypertension -seems to be fairly well controlled.  3. Carotid artery disease -  .  Stable   4. Hyperlipidemia -   continue current medications.  5. 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  6.  Paroxysmal atrial fibrillation - discovered after his stroke-  CHADS2VASC is   72 ( age, CAD, CVA, HTN, ) ,  He had a recent episode of paroxysmal atrial fibrillation and required cardioversion in the emergency room.  He has been seen in the A. fib clinic.  He is scheduled for Tikosyn admission next week.  7. Fatigue:     8. Iron deficiency:    Current medicines are reviewed at length with the patient today.  The patient does not have concerns regarding medicines.  The following changes have been made:  no change  Labs/ tests ordered today include:   Orders Placed This Encounter  Procedures   MYOCARDIAL PERFUSION IMAGING    Disposition:   FU with me in 6 months    Mertie Moores, MD  09/11/2019 5:23 PM    [address], [place], [place]  [address] Phone: [phone]; Fax: [phone]

## 2019-09-14 ENCOUNTER — Other Ambulatory Visit (HOSPITAL_COMMUNITY)
Admission: RE | Admit: 2019-09-14 | Discharge: 2019-09-14 | Disposition: A | Discharge status: Home / Self Care | Payer: Medicare Other | Source: Ambulatory Visit | Attending: Physician Assistant | Admitting: Physician Assistant

## 2019-09-14 DIAGNOSIS — Z01812 Encounter for preprocedural laboratory examination: Secondary | ICD-10-CM | POA: Insufficient documentation

## 2019-09-14 DIAGNOSIS — Z20828 Contact with and (suspected) exposure to other viral communicable diseases: Secondary | ICD-10-CM | POA: Diagnosis not present

## 2019-09-15 LAB — NOVEL CORONAVIRUS, NAA (HOSP ORDER, SEND-OUT TO REF LAB; TAT 18-24 HRS): SARS-CoV-2, NAA: NOT DETECTED

## 2019-09-18 ENCOUNTER — Other Ambulatory Visit: Payer: Self-pay | Admitting: Neurology

## 2019-09-18 ENCOUNTER — Ambulatory Visit (HOSPITAL_COMMUNITY)
Admission: RE | Admit: 2019-09-18 | Discharge: 2019-09-18 | Disposition: A | Discharge status: Home / Self Care | Payer: Medicare Other | Source: Ambulatory Visit | Attending: Physician Assistant | Admitting: Physician Assistant

## 2019-09-18 ENCOUNTER — Encounter (HOSPITAL_COMMUNITY): Payer: Self-pay | Admitting: Internal Medicine

## 2019-09-18 ENCOUNTER — Other Ambulatory Visit: Payer: Self-pay

## 2019-09-18 ENCOUNTER — Inpatient Hospital Stay (HOSPITAL_COMMUNITY)
Admission: AD | Admit: 2019-09-18 | Discharge: 2019-09-21 | DRG: 310 | Disposition: A | Discharge status: Home / Self Care | Payer: Medicare Other | Attending: Internal Medicine | Admitting: Internal Medicine

## 2019-09-18 VITALS — BP 130/68 | Ht 73.0 in | Wt 272.2 lb

## 2019-09-18 DIAGNOSIS — I2510 Atherosclerotic heart disease of native coronary artery without angina pectoris: Secondary | ICD-10-CM | POA: Diagnosis present

## 2019-09-18 DIAGNOSIS — Z8673 Personal history of transient ischemic attack (TIA), and cerebral infarction without residual deficits: Secondary | ICD-10-CM | POA: Diagnosis not present

## 2019-09-18 DIAGNOSIS — G4733 Obstructive sleep apnea (adult) (pediatric): Secondary | ICD-10-CM | POA: Diagnosis present

## 2019-09-18 DIAGNOSIS — I4819 Other persistent atrial fibrillation: Secondary | ICD-10-CM | POA: Diagnosis present

## 2019-09-18 DIAGNOSIS — I480 Paroxysmal atrial fibrillation: Secondary | ICD-10-CM

## 2019-09-18 DIAGNOSIS — Z96652 Presence of left artificial knee joint: Secondary | ICD-10-CM | POA: Diagnosis present

## 2019-09-18 DIAGNOSIS — Z8249 Family history of ischemic heart disease and other diseases of the circulatory system: Secondary | ICD-10-CM

## 2019-09-18 DIAGNOSIS — Z6835 Body mass index (BMI) 35.0-35.9, adult: Secondary | ICD-10-CM | POA: Diagnosis not present

## 2019-09-18 DIAGNOSIS — E669 Obesity, unspecified: Secondary | ICD-10-CM

## 2019-09-18 DIAGNOSIS — Z885 Allergy status to narcotic agent status: Secondary | ICD-10-CM | POA: Diagnosis not present

## 2019-09-18 DIAGNOSIS — Z888 Allergy status to other drugs, medicaments and biological substances status: Secondary | ICD-10-CM | POA: Diagnosis not present

## 2019-09-18 DIAGNOSIS — Z5181 Encounter for therapeutic drug level monitoring: Secondary | ICD-10-CM

## 2019-09-18 DIAGNOSIS — I10 Essential (primary) hypertension: Secondary | ICD-10-CM | POA: Diagnosis present

## 2019-09-18 DIAGNOSIS — K219 Gastro-esophageal reflux disease without esophagitis: Secondary | ICD-10-CM | POA: Diagnosis present

## 2019-09-18 DIAGNOSIS — E785 Hyperlipidemia, unspecified: Secondary | ICD-10-CM | POA: Diagnosis present

## 2019-09-18 DIAGNOSIS — Z981 Arthrodesis status: Secondary | ICD-10-CM

## 2019-09-18 DIAGNOSIS — Z87891 Personal history of nicotine dependence: Secondary | ICD-10-CM | POA: Diagnosis not present

## 2019-09-18 DIAGNOSIS — D6869 Other thrombophilia: Secondary | ICD-10-CM

## 2019-09-18 DIAGNOSIS — Z79899 Other long term (current) drug therapy: Secondary | ICD-10-CM | POA: Diagnosis not present

## 2019-09-18 DIAGNOSIS — Z880 Allergy status to penicillin: Secondary | ICD-10-CM

## 2019-09-18 DIAGNOSIS — Z955 Presence of coronary angioplasty implant and graft: Secondary | ICD-10-CM | POA: Diagnosis not present

## 2019-09-18 DIAGNOSIS — Z7901 Long term (current) use of anticoagulants: Secondary | ICD-10-CM

## 2019-09-18 HISTORY — DX: Encounter for therapeutic drug level monitoring: Z79.899

## 2019-09-18 HISTORY — DX: Encounter for therapeutic drug level monitoring: Z51.81

## 2019-09-18 LAB — BASIC METABOLIC PANEL
Anion gap: 10 (ref 5–15)
BUN: 25 mg/dL — ABNORMAL HIGH (ref 8–23)
CO2: 20 mmol/L — ABNORMAL LOW (ref 22–32)
Calcium: 9.5 mg/dL (ref 8.9–10.3)
Chloride: 110 mmol/L (ref 98–111)
Creatinine, Ser: 1.29 mg/dL — ABNORMAL HIGH (ref 0.61–1.24)
GFR calc Af Amer: >60 mL/min (ref >60)
GFR calc non Af Amer: 55 mL/min — ABNORMAL LOW (ref >60)
Glucose, Bld: 104 mg/dL — ABNORMAL HIGH (ref 70–99)
Potassium: 4.5 mmol/L (ref 3.5–5.1)
Sodium: 140 mmol/L (ref 135–145)

## 2019-09-18 LAB — MAGNESIUM: Magnesium: 2 mg/dL (ref 1.7–2.4)

## 2019-09-18 MED ORDER — PANTOPRAZOLE SODIUM 40 MG PO TBEC
40.0000 mg | DELAYED_RELEASE_TABLET | Freq: Every day | ORAL | Status: DC
  Administered 2019-09-19 – 2019-09-21 (×3): 40 mg via ORAL
  Filled 2019-09-18 (×3): qty 1

## 2019-09-18 MED ORDER — RIVAROXABAN 20 MG PO TABS
20.0000 mg | ORAL_TABLET | Freq: Every day | ORAL | Status: DC
  Administered 2019-09-18 – 2019-09-20 (×3): 20 mg via ORAL
  Filled 2019-09-18 (×3): qty 1

## 2019-09-18 MED ORDER — ALPRAZOLAM 0.5 MG PO TABS: 0.5000 mg | ORAL_TABLET | Freq: Two times a day (BID) | ORAL | Status: DC | PRN

## 2019-09-18 MED ORDER — SODIUM CHLORIDE 0.9 % IV SOLN: 250.0000 mL | INTRAVENOUS | Status: DC | PRN

## 2019-09-18 MED ORDER — DILTIAZEM HCL 60 MG PO TABS: 30.0000 mg | ORAL_TABLET | ORAL | Status: DC | PRN

## 2019-09-18 MED ORDER — FLUTICASONE PROPIONATE 50 MCG/ACT NA SUSP
1.0000 | Freq: Every day | NASAL | Status: DC
  Administered 2019-09-19 – 2019-09-21 (×3): 1 via NASAL
  Filled 2019-09-18: qty 16

## 2019-09-18 MED ORDER — FINASTERIDE 5 MG PO TABS
5.0000 mg | ORAL_TABLET | Freq: Every day | ORAL | Status: DC
  Administered 2019-09-19 – 2019-09-21 (×3): 5 mg via ORAL
  Filled 2019-09-18 (×3): qty 1

## 2019-09-18 MED ORDER — SODIUM CHLORIDE 0.9% FLUSH
3.0000 mL | Freq: Two times a day (BID) | INTRAVENOUS | Status: DC
  Administered 2019-09-19 – 2019-09-21 (×4): 3 mL via INTRAVENOUS

## 2019-09-18 MED ORDER — FENOFIBRATE 160 MG PO TABS
160.0000 mg | ORAL_TABLET | Freq: Every day | ORAL | Status: DC
  Administered 2019-09-19 – 2019-09-21 (×3): 160 mg via ORAL
  Filled 2019-09-18 (×3): qty 1

## 2019-09-18 MED ORDER — ATORVASTATIN CALCIUM 40 MG PO TABS
40.0000 mg | ORAL_TABLET | Freq: Every day | ORAL | Status: DC
  Administered 2019-09-18 – 2019-09-20 (×3): 40 mg via ORAL
  Filled 2019-09-18 (×3): qty 1

## 2019-09-18 MED ORDER — SACCHAROMYCES BOULARDII 250 MG PO CAPS
250.0000 mg | ORAL_CAPSULE | Freq: Every day | ORAL | Status: DC
  Administered 2019-09-19 – 2019-09-21 (×3): 250 mg via ORAL
  Filled 2019-09-18 (×3): qty 1

## 2019-09-18 MED ORDER — MAGNESIUM SULFATE 2 GM/50ML IV SOLN
2.0000 g | Freq: Once | INTRAVENOUS | Status: AC
  Administered 2019-09-18: 2 g via INTRAVENOUS
  Filled 2019-09-18: qty 50

## 2019-09-18 MED ORDER — TOPIRAMATE 25 MG PO TABS
50.0000 mg | ORAL_TABLET | Freq: Two times a day (BID) | ORAL | Status: DC
  Administered 2019-09-18 – 2019-09-21 (×6): 50 mg via ORAL
  Filled 2019-09-18 (×7): qty 2

## 2019-09-18 MED ORDER — DOFETILIDE 500 MCG PO CAPS
500.0000 ug | ORAL_CAPSULE | Freq: Two times a day (BID) | ORAL | Status: DC
  Administered 2019-09-18 – 2019-09-21 (×6): 500 ug via ORAL
  Filled 2019-09-18 (×6): qty 1

## 2019-09-18 MED ORDER — LISINOPRIL 10 MG PO TABS
10.0000 mg | ORAL_TABLET | Freq: Every day | ORAL | Status: DC
  Administered 2019-09-19 – 2019-09-21 (×3): 10 mg via ORAL
  Filled 2019-09-18 (×3): qty 1

## 2019-09-18 MED ORDER — LORATADINE 10 MG PO TABS
10.0000 mg | ORAL_TABLET | Freq: Every evening | ORAL | Status: DC
  Administered 2019-09-19 – 2019-09-20 (×2): 10 mg via ORAL
  Filled 2019-09-18 (×2): qty 1

## 2019-09-18 MED ORDER — ACETAMINOPHEN 325 MG PO TABS: 650.0000 mg | ORAL_TABLET | Freq: Four times a day (QID) | ORAL | Status: DC | PRN

## 2019-09-18 MED ORDER — NITROGLYCERIN 0.4 MG SL SUBL: 0.4000 mg | SUBLINGUAL_TABLET | SUBLINGUAL | Status: DC | PRN

## 2019-09-18 MED ORDER — LEVOCETIRIZINE DIHYDROCHLORIDE 5 MG PO TABS: 5.0000 mg | ORAL_TABLET | Freq: Every evening | ORAL | Status: DC

## 2019-09-18 MED ORDER — DILTIAZEM HCL 60 MG PO TABS: 30.0000 mg | ORAL_TABLET | Freq: Four times a day (QID) | ORAL | Status: DC

## 2019-09-18 MED ORDER — ATORVASTATIN CALCIUM 40 MG PO TABS: 40.0000 mg | ORAL_TABLET | Freq: Every day | ORAL | Status: DC

## 2019-09-18 MED ORDER — ACETAMINOPHEN ER 650 MG PO TBCR: 1300.0000 mg | EXTENDED_RELEASE_TABLET | Freq: Every day | ORAL | Status: DC | PRN

## 2019-09-18 MED ORDER — SODIUM CHLORIDE 0.9% FLUSH: 3.0000 mL | INTRAVENOUS | Status: DC | PRN

## 2019-09-18 MED ORDER — TAMSULOSIN HCL 0.4 MG PO CAPS
0.4000 mg | ORAL_CAPSULE | Freq: Every day | ORAL | Status: DC
  Administered 2019-09-19 – 2019-09-21 (×3): 0.4 mg via ORAL
  Filled 2019-09-18 (×3): qty 1

## 2019-09-18 MED ORDER — METOPROLOL SUCCINATE ER 100 MG PO TB24
100.0000 mg | ORAL_TABLET | Freq: Every day | ORAL | Status: DC
  Administered 2019-09-20: 100 mg via ORAL
  Filled 2019-09-18 (×2): qty 1

## 2019-09-18 MED ORDER — THERA M PLUS PO TABS: 1.0000 | ORAL_TABLET | Freq: Every day | ORAL | Status: DC

## 2019-09-18 MED ORDER — ADULT MULTIVITAMIN W/MINERALS CH
1.0000 | ORAL_TABLET | Freq: Every day | ORAL | Status: DC
  Administered 2019-09-19 – 2019-09-21 (×3): 1 via ORAL
  Filled 2019-09-18 (×3): qty 1

## 2019-09-18 NOTE — H&P (Signed)
h   Primary Care Physician: [name], MD Primary Cardiologist: Dr Acie Fredrickson Primary Electrophysiologist: none Referring Physician: Dr Deatra Canter is a 73 y.o. male with a history of CAD, HTN, OSA, CVA, paroxysmal atrial fibrillation, and HLD who presents for consultation in the [facility].  The patient was initially diagnosed with atrial fibrillation in 2016 after presenting with symptoms of presyncope. He converted on IV diltiazem and has been maintained on Xarelto. Patient had done well for years until 07/06/19 when he presented to the ER with symptoms of palpitations and chest pressure and found to be in afib vs atrial flutter with RVR. He converted while being evaluated in the ER and his symptoms resolved. Since then, he has not had any further symptoms. He denies any specific triggers that he could identify. He has a diagnosis of OSA and is compliant with his CPAP. He denies significant alcohol use. He is on Xarelto for a CHADS2VASC score of 5. Patient had another episode of afib on 09/07/19. There were no specific triggers that the patient could identify. He took the PRN diltiazem without much relief. He presented to the ER in afib with RVR and underwent successful DCCV.   On follow up today, patient presents for dofetilide admission. He reports that he has done well since his last visit with no heart racing or palpitations. Note plans for myoview. He stopped the HCTZ 4 days ago.  Today, he denies symptoms of palpitations, chest pain, shortness of breath, orthopnea, PND, lower extremity edema, dizziness, presyncope, syncope, snoring, daytime somnolence, bleeding, or neurologic sequela. The patient is tolerating medications without difficulties and is otherwise without complaint today.    Atrial Fibrillation Risk Factors:  he does have symptoms or diagnosis of sleep apnea. he is compliant with CPAP therapy. he does not have a history of rheumatic  fever. he does not have a history of alcohol use. The patient does not have a history of early familial atrial fibrillation or other arrhythmias.  he has a BMI of Body mass index is 35.9 kg/m.Marland Kitchen Filed Weights   09/18/19 1556  Weight: 123.4 kg    Family History  Problem Relation Age of Onset  . Hypertension Mother   . Heart disease Father   . Heart attack Father      Atrial Fibrillation Management history:  Previous antiarrhythmic drugs: none Previous cardioversions: 09/07/19 Previous ablations: none CHADS2VASC score: 5 Anticoagulation history: Xarelto    Past Medical History:  Diagnosis Date  . Arthritis    "back, right knee, hands, ankles, neck" (05/31/2016)  . Atrial fibrillation with RVR ([place])   . Carotid artery disease ([place]) 08/30/2016   Carotid US 11/19: R 1-39, L 40-59 // Carotid US 08/2019: R 1-39; L 40-59; repeat 1 year  . Chronic lower back pain   . Coronary atherosclerosis of native coronary artery    a. BMS to [facility] 2004 and 2007, otherwise mild nonobstructive disease. EF normal.  . Diverticulitis   . Dyslipidemia   . Essential hypertension, benign   . GERD (gastroesophageal reflux disease)   . Lumbar radiculopathy, chronic 02/04/2015   Right L5  . Obesity   . OSA on CPAP   . Paroxysmal atrial fibrillation ([place])    a. Discovered after stroke.  . Pneumonia 01/1996  . PONV (postoperative nausea and vomiting)   . Recurrent upper respiratory infection (URI)   . Visit for monitoring Tikosyn therapy 09/18/2019   Past Surgical History:  Procedure Laterality Date  .  ANTERIOR CERVICAL DECOMP/DISCECTOMY FUSION  07/2001; 10/2002   "C5-6; C6-7; redo"  . BACK SURGERY    . CARPAL TUNNEL RELEASE Left 10/2015  . COLONOSCOPY W/ POLYPECTOMY  02/2014  . CORONARY ANGIOPLASTY WITH STENT PLACEMENT  05/2003; 12/2005   "mid RCA; mid RCA"  . HAND SURGERY  02/2019   LEFT HAND  . JOINT REPLACEMENT    . KNEE ARTHROSCOPY Left 10/2005  . KNEE ARTHROSCOPY W/ PARTIAL MEDIAL  MENISCECTOMY Left 09/2005  . LUMBAR LAMINECTOMY/DECOMPRESSION MICRODISCECTOMY  03/2005   "L4-5"  . POSTERIOR LUMBAR FUSION  10/2003   L5-S1; "plates, screws"  . SHOULDER ARTHROSCOPY Right 08/2011   Debridement of labrum, arthroscopic distal clavicle excision  . SHOULDER OPEN ROTATOR CUFF REPAIR Left 07/2014  . TEE WITHOUT CARDIOVERSION  07/07/2012   Procedure: TRANSESOPHAGEAL ECHOCARDIOGRAM (TEE);  Surgeon: Fay Records, MD;  Location: [facility];  Service: Cardiovascular;  Laterality: N/A;  . TONSILLECTOMY AND ADENOIDECTOMY  ~ 1956  . TOTAL KNEE ARTHROPLASTY Left 10/2006  . TRIGGER FINGER RELEASE Left 10/2015    Current Facility-Administered Medications  Medication Dose Route Frequency Provider Last Rate Last Admin  . 0.9 %  sodium chloride infusion  250 mL Intravenous PRN Fenton, Clint R, PA      . acetaminophen (TYLENOL) tablet 650 mg  650 mg Oral Q6H PRN Lekeisha Arenas, MD      . ALPRAZolam Duanne Moron) tablet 0.5 mg  0.5 mg Oral BID PRN Fenton, Clint R, PA      . atorvastatin (LIPITOR) tablet 40 mg  40 mg Oral q1800 Thompson Grayer, MD   40 mg at 09/18/19 2010  . diltiazem (CARDIZEM) tablet 30 mg  30 mg Oral PRN Shirley Friar, PA-C      . dofetilide (TIKOSYN) capsule 500 mcg  500 mcg Oral BID Fenton, Clint R, PA   500 mcg at 09/18/19 2010  . [START ON 09/19/2019] fenofibrate tablet 160 mg  160 mg Oral Daily Fenton, Clint R, PA      . [START ON 09/19/2019] finasteride (PROSCAR) tablet 5 mg  5 mg Oral Daily Fenton, Clint R, PA      . [START ON 09/19/2019] fluticasone (FLONASE) 50 MCG/ACT nasal spray 1 spray  1 spray Each Nare Daily Fenton, Clint R, PA      . [START ON 09/19/2019] lisinopril (ZESTRIL) tablet 10 mg  10 mg Oral Daily Fenton, Clint R, PA      . loratadine (CLARITIN) tablet 10 mg  10 mg Oral QPM Kiandre Spagnolo, Jeneen Rinks, MD      . Derrill Memo ON 09/19/2019] metoprolol succinate (TOPROL-XL) 24 hr tablet 100 mg  100 mg Oral Daily Fenton, Clint R, PA      . [START ON 09/19/2019] multivitamin  with minerals tablet 1 tablet  1 tablet Oral Daily Dekota Shenk, MD      . nitroGLYCERIN (NITROSTAT) SL tablet 0.4 mg  0.4 mg Sublingual Q5 min PRN Fenton, Clint R, PA      . [START ON 09/19/2019] pantoprazole (PROTONIX) EC tablet 40 mg  40 mg Oral Daily Fenton, Clint R, PA      . rivaroxaban (XARELTO) tablet 20 mg  20 mg Oral Q supper Fenton, Clint R, PA   20 mg at 09/18/19 1745  . [START ON 09/19/2019] saccharomyces boulardii (FLORASTOR) capsule 250 mg  250 mg Oral Daily Fenton, Clint R, PA      . sodium chloride flush (NS) 0.9 % injection 3 mL  3 mL Intravenous Q12H Fenton, Clint R, PA      .  sodium chloride flush (NS) 0.9 % injection 3 mL  3 mL Intravenous PRN Fenton, Clint R, PA      . [START ON 09/19/2019] tamsulosin (FLOMAX) capsule 0.4 mg  0.4 mg Oral Daily Fenton, Clint R, PA      . topiramate (TOPAMAX) tablet 50 mg  50 mg Oral BID Fenton, Clint R, PA        Allergies  Allergen Reactions  . Amoxicillin Swelling  . Fosinopril Other (See Comments)  . Ampicillin Rash  . Codeine Nausea Only and Other (See Comments)    Heavy amounts cause nausea  . Monopril [Fosinopril Sodium] Other (See Comments)    Muscle aches & pain  . Dilaudid [Hydromorphone Hcl] Nausea Only  . Percocet [Oxycodone-Acetaminophen] Nausea And Vomiting    Social History   Socioeconomic History  . Marital status: Married    Spouse name: [name]  . Number of children: 3  . Years of education: 80  . Highest education level: Not on file  Occupational History  . Occupation: Retired    Fish farm manager: DISABLED     Comment: Works at [employer] part time  Tobacco Use  . Smoking status: Former Smoker    Packs/day: 1.00    Years: 34.00    Pack years: 34.00    Types: Cigarettes    Quit date: 05/05/2003    Years since quitting: 16.3  . Smokeless tobacco: Never Used  Substance and Sexual Activity  . Alcohol use: No    Alcohol/week: 0.0 standard drinks  . Drug use: No  . Sexual activity: Yes  Other Topics Concern  .  Not on file  Social History Narrative   Patient lives at home with spouse.   Caffeine Use:    Social Determinants of Health   Financial Resource Strain:   . Difficulty of Paying Living Expenses: Not on file  Food Insecurity:   . Worried About Charity fundraiser in the Last Year: Not on file  . Ran Out of Food in the Last Year: Not on file  Transportation Needs:   . Lack of Transportation (Medical): Not on file  . Lack of Transportation (Non-Medical): Not on file  Physical Activity:   . Days of Exercise per Week: Not on file  . Minutes of Exercise per Session: Not on file  Stress:   . Feeling of Stress : Not on file  Social Connections:   . Frequency of Communication with Friends and Family: Not on file  . Frequency of Social Gatherings with Friends and Family: Not on file  . Attends Religious Services: Not on file  . Active Member of Clubs or Organizations: Not on file  . Attends Archivist Meetings: Not on file  . Marital Status: Not on file  Intimate Partner Violence:   . Fear of Current or Ex-Partner: Not on file  . Emotionally Abused: Not on file  . Physically Abused: Not on file  . Sexually Abused: Not on file     ROS- All systems are reviewed and negative except as per the HPI above.  Physical Exam: Vitals:   09/18/19 1556 09/18/19 2013  BP: 139/74 133/80  Pulse: 66 (!) 54  Resp: 18   Temp: 98.8 F (37.1 C) 97.7 F (36.5 C)  TempSrc: Oral Axillary  SpO2: 98% 97%  Weight: 123.4 kg   Height: 6\' 1"  (1.854 m)     GEN- The patient is well appearing obese male, alert and oriented x 3 today.   HEENT-head normocephalic,  atraumatic, sclera clear, conjunctiva pink, hearing intact, trachea midline. Lungs- Clear to ausculation bilaterally, normal work of breathing Heart- Regular rate and rhythm, no murmurs, rubs or gallops  GI- soft, NT, ND, + BS Extremities- no clubbing, cyanosis, or edema MS- no significant deformity or atrophy Skin- no rash or  lesion Psych- euthymic mood, full affect Neuro- strength and sensation are intact   Wt Readings from Last 3 Encounters:  09/18/19 123.4 kg  09/18/19 123.5 kg  09/11/19 123.8 kg    EKG today demonstrates SR HR 69, inc RBBB, PR 184, QRS 112, QTc 435. (Computer read as anterior STEMI, appears more like motion artifact, patient denies any CP/SOB)  Echo 07/19/19 demonstrated   1. Left ventricular ejection fraction, by visual estimation, is 60 to 65%. The left ventricle has normal function. Normal left ventricular size. There is mildly increased left ventricular hypertrophy.  2. Definity contrast agent was given IV to delineate the left ventricular endocardial borders.  3. Global right ventricle has normal systolic function.The right ventricular size is normal. No increase in right ventricular wall thickness.  4. Left atrial size was mildly dilated.  5. Right atrial size was normal.  6. The mitral valve is normal in structure. Trace mitral valve regurgitation. No evidence of mitral stenosis.  7. The tricuspid valve is normal in structure. Tricuspid valve regurgitation was not visualized by color flow Doppler.  8. The aortic valve is tricuspid Aortic valve regurgitation was not visualized by color flow Doppler. Mild aortic valve sclerosis without stenosis.  9. The pulmonic valve was normal in structure. Pulmonic valve regurgitation is not visualized by color flow Doppler. 10. The inferior vena cava is normal in size with greater than 50% respiratory variability, suggesting right atrial pressure of 3 mmHg.  Epic records are reviewed at length today  Assessment and Plan:  1. Paroxysmal atrial fibrillation Patient wants to pursue dofetilide, aware of risk vrs benefit. Aware of price of dofetilide. Continue Xarelto 20 mg daily. Patient denies any missed doses in the last 3 weeks. No benadryl use PharmD has screened drugs and patient has stopped HCTZ. QTc in SR 435 ms, Labs today show  creatinine at 1.29, K+ 4.5 and mag 2.0, CrCl calculated at 90 mL/min Continue Toprol 100 mg BID Continue diltiazem 30 mg PRN q4hrs for heart racing.  This patients CHA2DS2-VASc Score and unadjusted Ischemic Stroke Rate (% per year) is equal to 7.2 % stroke rate/year from a score of 5  Above score calculated as 1 point each if present [CHF, HTN, DM, Vascular=MI/PAD/Aortic Plaque, Age if 65-74, or Male] Above score calculated as 2 points each if present [Age > 75, or Stroke/TIA/TE]   2. Obesity Body mass index is 35.9 kg/m. Lifestyle modification was discussed and encouraged including regular physical activity and weight reduction.  3. Obstructive sleep apnea The importance of adequate treatment of sleep apnea was discussed today in order to improve our ability to maintain sinus rhythm long term. Patient reports compliance with CPAP therapy.  4. CAD No anginal symptoms today. Plans for myoveiw noted. Followed by Dr Acie Fredrickson.   5. HTN Stable, no changes today.   To be admitted later today once bed becomes available.    [facility] [address] [place], [place] [address] [phone] 09/18/2019 8:18 PM     I have seen, examined the patient, and reviewed the above assessment and plan.  Changes to above are made where necessary.  On exam, RRR.  I agree with admissions for tikosyn.  We will follow qt closely.  Follow BP off of hctz.  Co Sign: Thompson Grayer, MD 09/18/2019 8:19 PM

## 2019-09-18 NOTE — Progress Notes (Signed)
Pharmacy: Dofetilide (Tikosyn) - Initial Consult Assessment and Electrolyte Replacement  Pharmacy consulted to assist in monitoring and replacing electrolytes in this 73 y.o. male admitted on 09/18/2019 undergoing dofetilide initiation. First dofetilide dose: 500 mcg BID  Assessment:  Patient Exclusion Criteria: If any screening criteria checked as "Yes", then  patient  should NOT receive dofetilide until criteria item is corrected.  If "Yes" please indicate correction plan.  YES  NO Patient  Exclusion Criteria Correction Plan   []   [x]   Baseline QTc interval is greater than or equal to 440 msec. IF above YES box checked dofetilide contraindicated unless patient has ICD; then may proceed if QTc 500-550 msec or with known ventricular conduction abnormalities may proceed with QTc 550-600 msec. QTc = 435    []   [x]   Patient is known or suspected to have a digoxin level greater than 2 ng/ml: No results found for: DIGOXIN     []   [x]   Creatinine clearance less than 20 ml/min (calculated using Cockcroft-Gault, actual body weight and serum creatinine): Estimated Creatinine Clearance: 71.2 mL/min (A) (by C-G formula based on SCr of 1.29 mg/dL (H)).     []   [x]  Patient has received drugs known to prolong the QT intervals within the last 48 hours (phenothiazines, tricyclics or tetracyclic antidepressants, erythromycin, H-1 antihistamines, cisapride, fluoroquinolones, azithromycin). Drugs not listed above may have an, as yet, undetected potential to prolong the QT interval, updated information on QT prolonging agents is available at this website:QT prolonging agents or www.crediblemeds.org    []   [x]   Patient received a dose of hydrochlorothiazide (Oretic) alone or in any combination including triamterene (Dyazide, Maxzide) in the last 48 hours.    []   [x]  Patient received a medication known to increase dofetilide plasma concentrations prior to initial dofetilide dose:  . Trimethoprim  (Primsol, Proloprim) in the last 36 hours . Verapamil (Calan, Verelan) in the last 36 hours or a sustained release dose in the last 72 hours . Megestrol (Megace) in the last 5 days  . Cimetidine (Tagamet) in the last 6 hours . Ketoconazole (Nizoral) in the last 24 hours . Itraconazole (Sporanox) in the last 48 hours  . Prochlorperazine (Compazine) in the last 36 hours     []   [x]   Patient is known to have a history of torsades de pointes; congenital or acquired long QT syndromes.    []   [x]   Patient has received a Class 1 antiarrhythmic with less than 2 half-lives since last dose. (Disopyramide, Quinidine, Procainamide, Lidocaine, Mexiletine, Flecainide, Propafenone)    []   [x]   Patient has received amiodarone therapy in the past 3 months or amiodarone level is greater than 0.3 ng/ml.    Patient has been appropriately anticoagulated with Xarelto.  Labs:    Component Value Date/Time   K 4.5 09/18/2019 1138   MG 2.0 09/18/2019 1138     Plan: Potassium: K >/= 4: Appropriate to initiate Tikosyn, no replacement needed    Magnesium: Mg 1.8-2: Give Mg 2 gm IV x1 to prevent Mg from dropping below 1.8 - do not need to recheck Mg. Appropriate to initiate Tikosyn   Thank you for allowing pharmacy to participate in this patient's care   Marguerite Olea, [facility] Clinical Pharmacist Phone [phone]  09/18/2019 4:14 PM

## 2019-09-18 NOTE — Progress Notes (Signed)
Primary Care Physician: [name], MD Primary Cardiologist: Dr Acie Fredrickson Primary Electrophysiologist: none Referring Physician: Dr Deatra Canter is a 73 y.o. male with a history of CAD, HTN, OSA, CVA, paroxysmal atrial fibrillation, and HLD who presents for consultation in the [facility].  The patient was initially diagnosed with atrial fibrillation in 2016 after presenting with symptoms of presyncope. He converted on IV diltiazem and has been maintained on Xarelto. Patient had done well for years until 07/06/19 when he presented to the ER with symptoms of palpitations and chest pressure and found to be in afib vs atrial flutter with RVR. He converted while being evaluated in the ER and his symptoms resolved. Since then, he has not had any further symptoms. He denies any specific triggers that he could identify. He has a diagnosis of OSA and is compliant with his CPAP. He denies significant alcohol use. He is on Xarelto for a CHADS2VASC score of 5. Patient had another episode of afib on 09/07/19. There were no specific triggers that the patient could identify. He took the PRN diltiazem without much relief. He presented to the ER in afib with RVR and underwent successful DCCV.   On follow up today, patient presents for dofetilide admission. He reports that he has done well since his last visit with no heart racing or palpitations. Note plans for myoview. He stopped the HCTZ 4 days ago.  Today, he denies symptoms of palpitations, chest pain, shortness of breath, orthopnea, PND, lower extremity edema, dizziness, presyncope, syncope, snoring, daytime somnolence, bleeding, or neurologic sequela. The patient is tolerating medications without difficulties and is otherwise without complaint today.    Atrial Fibrillation Risk Factors:  he does have symptoms or diagnosis of sleep apnea. he is compliant with CPAP therapy. he does not have a history of rheumatic fever.  he does not have a history of alcohol use. The patient does not have a history of early familial atrial fibrillation or other arrhythmias.  he has a BMI of Body mass index is 35.91 kg/m.Marland Kitchen Filed Weights   09/18/19 1122  Weight: 123.5 kg    Family History  Problem Relation Age of Onset  . Hypertension Mother   . Heart disease Father   . Heart attack Father      Atrial Fibrillation Management history:  Previous antiarrhythmic drugs: none Previous cardioversions: 09/07/19 Previous ablations: none CHADS2VASC score: 5 Anticoagulation history: Xarelto    Past Medical History:  Diagnosis Date  . Arthritis    "back, right knee, hands, ankles, neck" (05/31/2016)  . Atrial fibrillation with RVR ([place])   . Carotid artery disease ([place]) 08/30/2016   Carotid US 11/19: R 1-39, L 40-59 // Carotid US 08/2019: R 1-39; L 40-59; repeat 1 year  . Chronic lower back pain   . Coronary atherosclerosis of native coronary artery    a. BMS to [facility] 2004 and 2007, otherwise mild nonobstructive disease. EF normal.  . Diverticulitis   . Dyslipidemia   . Essential hypertension, benign   . GERD (gastroesophageal reflux disease)   . Lumbar radiculopathy, chronic 02/04/2015   Right L5  . Obesity   . OSA on CPAP   . Paroxysmal atrial fibrillation ([place])    a. Discovered after stroke.  . Pneumonia 01/1996  . PONV (postoperative nausea and vomiting)   . Recurrent upper respiratory infection (URI)    Past Surgical History:  Procedure Laterality Date  . ANTERIOR CERVICAL DECOMP/DISCECTOMY FUSION  07/2001; 10/2002   "  C5-6; C6-7; redo"  . BACK SURGERY    . CARPAL TUNNEL RELEASE Left 10/2015  . COLONOSCOPY W/ POLYPECTOMY  02/2014  . CORONARY ANGIOPLASTY WITH STENT PLACEMENT  05/2003; 12/2005   "mid RCA; mid RCA"  . JOINT REPLACEMENT    . KNEE ARTHROSCOPY Left 10/2005  . KNEE ARTHROSCOPY W/ PARTIAL MEDIAL MENISCECTOMY Left 09/2005  . LUMBAR LAMINECTOMY/DECOMPRESSION MICRODISCECTOMY  03/2005   "L4-5"  .  POSTERIOR LUMBAR FUSION  10/2003   L5-S1; "plates, screws"  . SHOULDER ARTHROSCOPY Right 08/2011   Debridement of labrum, arthroscopic distal clavicle excision  . SHOULDER OPEN ROTATOR CUFF REPAIR Left 07/2014  . TEE WITHOUT CARDIOVERSION  07/07/2012   Procedure: TRANSESOPHAGEAL ECHOCARDIOGRAM (TEE);  Surgeon: Fay Records, MD;  Location: [facility];  Service: Cardiovascular;  Laterality: N/A;  . TONSILLECTOMY AND ADENOIDECTOMY  ~ 1956  . TOTAL KNEE ARTHROPLASTY Left 10/2006  . TRIGGER FINGER RELEASE Left 10/2015    Current Outpatient Medications  Medication Sig Dispense Refill  . acetaminophen (TYLENOL 8 HOUR ARTHRITIS PAIN) 650 MG CR tablet Take 1,300 mg by mouth daily as needed for pain (pain).     Marland Kitchen ALPRAZolam (XANAX) 0.5 MG tablet Take 0.5 mg by mouth as needed.   1  . atorvastatin (LIPITOR) 40 MG tablet Take 1 tablet (40 mg total) by mouth daily. 90 tablet 3  . diltiazem (CARDIZEM) 30 MG tablet Take 1 tablet (30 mg total) by mouth 4 (four) times daily as needed. 60 tablet 6  . fenofibrate 160 MG tablet Take 1 tablet (160 mg total) by mouth daily. 90 tablet 3  . finasteride (PROSCAR) 5 MG tablet finasteride  5 mg tabs    . fluticasone (FLONASE) 50 MCG/ACT nasal spray Place 1 spray into the nose daily.    Marland Kitchen levocetirizine (XYZAL) 5 MG tablet Take 5 mg by mouth every evening.    Marland Kitchen lisinopril (ZESTRIL) 10 MG tablet lisinopril  10 mg tabs    . metoprolol succinate (TOPROL-XL) 50 MG 24 hr tablet Take 2 tablets (100 mg total) by mouth daily. Take with or immediately following a meal. 180 tablet 3  . Multiple Vitamins-Minerals (MULTIVITAMINS THER. W/MINERALS) TABS Take 1 tablet by mouth daily.      . nitroGLYCERIN (NITROSTAT) 0.4 MG SL tablet Place 1 tablet (0.4 mg total) under the tongue every 5 (five) minutes as needed for chest pain. 25 tablet 5  . omeprazole (PRILOSEC) 10 MG capsule Take 1 capsule (10 mg total) by mouth daily. 90 capsule 3  . saccharomyces boulardii (FLORASTOR) 250 MG  capsule daily.     . sildenafil (REVATIO) 20 MG tablet Take 2 to 5 tabs daily as needed for erectile dysfunction (Patient taking differently: as needed. ) 30 tablet 2  . Tamsulosin HCl (FLOMAX) 0.4 MG CAPS Take 0.4 mg by mouth daily.      Marland Kitchen topiramate (TOPAMAX) 50 MG tablet TAKE 1 TABLET BY MOUTH TWICE A DAY 180 tablet 0  . XARELTO 20 MG TABS tablet TAKE 1 TABLET BY MOUTH EVERY DAY 90 tablet 1  . hydrochlorothiazide (HYDRODIURIL) 25 MG tablet Take 1 tablet (25 mg total) by mouth daily. (Patient not taking: Reported on 09/18/2019) 90 tablet 3  . potassium chloride (K-DUR) 10 MEQ tablet Take 1 tablet (10 mEq total) by mouth daily. (Patient not taking: Reported on 09/18/2019) 90 tablet 3   No current facility-administered medications for this encounter.    Allergies  Allergen Reactions  . Amoxicillin Swelling  . Fosinopril Other (See Comments)  .  Ampicillin Rash  . Codeine Nausea Only and Other (See Comments)    Heavy amounts cause nausea  . Monopril [Fosinopril Sodium] Other (See Comments)    Muscle aches & pain  . Dilaudid [Hydromorphone Hcl] Nausea Only  . Percocet [Oxycodone-Acetaminophen] Nausea And Vomiting    Social History   Socioeconomic History  . Marital status: Married    Spouse name: [name]  . Number of children: 3  . Years of education: 68  . Highest education level: Not on file  Occupational History  . Occupation: Retired    Fish farm manager: DISABLED     Comment: Works at [employer] part time  Tobacco Use  . Smoking status: Former Smoker    Packs/day: 1.00    Years: 34.00    Pack years: 34.00    Types: Cigarettes    Quit date: 05/05/2003    Years since quitting: 16.3  . Smokeless tobacco: Never Used  Substance and Sexual Activity  . Alcohol use: No    Alcohol/week: 0.0 standard drinks  . Drug use: No  . Sexual activity: Yes  Other Topics Concern  . Not on file  Social History Narrative   Patient lives at home with spouse.   Caffeine Use:    Social  Determinants of Health   Financial Resource Strain:   . Difficulty of Paying Living Expenses: Not on file  Food Insecurity:   . Worried About Charity fundraiser in the Last Year: Not on file  . Ran Out of Food in the Last Year: Not on file  Transportation Needs:   . Lack of Transportation (Medical): Not on file  . Lack of Transportation (Non-Medical): Not on file  Physical Activity:   . Days of Exercise per Week: Not on file  . Minutes of Exercise per Session: Not on file  Stress:   . Feeling of Stress : Not on file  Social Connections:   . Frequency of Communication with Friends and Family: Not on file  . Frequency of Social Gatherings with Friends and Family: Not on file  . Attends Religious Services: Not on file  . Active Member of Clubs or Organizations: Not on file  . Attends Archivist Meetings: Not on file  . Marital Status: Not on file  Intimate Partner Violence:   . Fear of Current or Ex-Partner: Not on file  . Emotionally Abused: Not on file  . Physically Abused: Not on file  . Sexually Abused: Not on file     ROS- All systems are reviewed and negative except as per the HPI above.  Physical Exam: Vitals:   09/18/19 1122  BP: 130/68  Weight: 123.5 kg  Height: 6\' 1"  (1.854 m)    GEN- The patient is well appearing obese male, alert and oriented x 3 today.   HEENT-head normocephalic, atraumatic, sclera clear, conjunctiva pink, hearing intact, trachea midline. Lungs- Clear to ausculation bilaterally, normal work of breathing Heart- Regular rate and rhythm, no murmurs, rubs or gallops  GI- soft, NT, ND, + BS Extremities- no clubbing, cyanosis, or edema MS- no significant deformity or atrophy Skin- no rash or lesion Psych- euthymic mood, full affect Neuro- strength and sensation are intact   Wt Readings from Last 3 Encounters:  09/18/19 123.5 kg  09/11/19 123.8 kg  09/10/19 124 kg    EKG today demonstrates SR HR 69, inc RBBB, PR 184, QRS 112,  QTc 435. (Computer read as anterior STEMI, appears more like motion artifact, patient denies any CP/SOB)  Echo 07/19/19 demonstrated   1. Left ventricular ejection fraction, by visual estimation, is 60 to 65%. The left ventricle has normal function. Normal left ventricular size. There is mildly increased left ventricular hypertrophy.  2. Definity contrast agent was given IV to delineate the left ventricular endocardial borders.  3. Global right ventricle has normal systolic function.The right ventricular size is normal. No increase in right ventricular wall thickness.  4. Left atrial size was mildly dilated.  5. Right atrial size was normal.  6. The mitral valve is normal in structure. Trace mitral valve regurgitation. No evidence of mitral stenosis.  7. The tricuspid valve is normal in structure. Tricuspid valve regurgitation was not visualized by color flow Doppler.  8. The aortic valve is tricuspid Aortic valve regurgitation was not visualized by color flow Doppler. Mild aortic valve sclerosis without stenosis.  9. The pulmonic valve was normal in structure. Pulmonic valve regurgitation is not visualized by color flow Doppler. 10. The inferior vena cava is normal in size with greater than 50% respiratory variability, suggesting right atrial pressure of 3 mmHg.  Epic records are reviewed at length today  Assessment and Plan:  1. Paroxysmal atrial fibrillation Patient wants to pursue dofetilide, aware of risk vrs benefit. Aware of price of dofetilide. Continue Xarelto 20 mg daily. Patient denies any missed doses in the last 3 weeks. No benadryl use PharmD has screened drugs and patient has stopped HCTZ. QTc in SR 435 ms, Labs today show creatinine at 1.29, K+ 4.5 and mag 2.0, CrCl calculated at 90 mL/min Continue Toprol 100 mg BID Continue diltiazem 30 mg PRN q4hrs for heart racing.  This patients CHA2DS2-VASc Score and unadjusted Ischemic Stroke Rate (% per year) is equal to 7.2 %  stroke rate/year from a score of 5  Above score calculated as 1 point each if present [CHF, HTN, DM, Vascular=MI/PAD/Aortic Plaque, Age if 65-74, or Male] Above score calculated as 2 points each if present [Age > 75, or Stroke/TIA/TE]   2. Obesity Body mass index is 35.91 kg/m. Lifestyle modification was discussed and encouraged including regular physical activity and weight reduction.  3. Obstructive sleep apnea The importance of adequate treatment of sleep apnea was discussed today in order to improve our ability to maintain sinus rhythm long term. Patient reports compliance with CPAP therapy.  4. CAD No anginal symptoms today. Plans for myoveiw noted. Followed by Dr Acie Fredrickson.   5. HTN Stable, no changes today.   To be admitted later today once bed becomes available.    [facility] [address] [place], [place] [address] [phone] 09/18/2019 12:09 PM

## 2019-09-19 ENCOUNTER — Other Ambulatory Visit: Payer: Self-pay | Admitting: Cardiovascular Disease

## 2019-09-19 LAB — BASIC METABOLIC PANEL
Anion gap: 10 (ref 5–15)
BUN: 25 mg/dL — ABNORMAL HIGH (ref 8–23)
CO2: 21 mmol/L — ABNORMAL LOW (ref 22–32)
Calcium: 9.1 mg/dL (ref 8.9–10.3)
Chloride: 109 mmol/L (ref 98–111)
Creatinine, Ser: 1.21 mg/dL (ref 0.61–1.24)
GFR calc Af Amer: >60 mL/min (ref >60)
GFR calc non Af Amer: 59 mL/min — ABNORMAL LOW (ref >60)
Glucose, Bld: 108 mg/dL — ABNORMAL HIGH (ref 70–99)
Potassium: 3.9 mmol/L (ref 3.5–5.1)
Sodium: 140 mmol/L (ref 135–145)

## 2019-09-19 LAB — MAGNESIUM: Magnesium: 2.2 mg/dL (ref 1.7–2.4)

## 2019-09-19 MED ORDER — POTASSIUM CHLORIDE CRYS ER 20 MEQ PO TBCR
20.0000 meq | EXTENDED_RELEASE_TABLET | Freq: Once | ORAL | Status: AC
  Administered 2019-09-19: 20 meq via ORAL
  Filled 2019-09-19: qty 1

## 2019-09-19 NOTE — Progress Notes (Signed)
Pharmacy: Dofetilide (Tikosyn) - Follow Up Assessment and Electrolyte Replacement  Pharmacy consulted to assist in monitoring and replacing electrolytes in this 73 y.o. male admitted on 09/18/2019 undergoing dofetilide initiation.   Labs:    Component Value Date/Time   K 3.9 09/19/2019 0346   MG 2.2 09/19/2019 0346     Plan: Potassium: K= 3.9. Potassium has been ordered per MD  Magnesium: Mg > 2: No additional supplementation needed   Thank you for allowing pharmacy to participate in this patient's care   Hildred Laser, PharmD Clinical Pharmacist **Pharmacist phone directory can now be found on [email or web address] ([email or web address]).  Listed under [place].

## 2019-09-19 NOTE — Progress Notes (Signed)
Morning EKG reviewed  Shows remains in NSR/sinus brady at 48 bpm with stable QTc at ~460 ms when measured manually and corrected for rate  Continue Tikosyn 500 mcg BID for now  Pt will not require DCCV   Shirley Friar, [place]  Pager: [phone]  09/19/2019 12:57 PM

## 2019-09-19 NOTE — Progress Notes (Addendum)
Electrophysiology Rounding Note  Patient Name: Corey [name] Date of Encounter: 09/19/2019  Primary Cardiologist: Mertie Moores, MD  Electrophysiologist: New to Dr. Rayann Heman   Subjective   Pt remains in NSR/sinus brady on Tikosyn 500 mcg BID   QTc from EKG last pm shows stable QTc at ~460 ms  The patient is doing well today.  At this time, the patient denies chest pain, shortness of breath, or any new concerns.  Inpatient Medications    Scheduled Meds: . atorvastatin  40 mg Oral q1800  . dofetilide  500 mcg Oral BID  . fenofibrate  160 mg Oral Daily  . finasteride  5 mg Oral Daily  . fluticasone  1 spray Each Nare Daily  . lisinopril  10 mg Oral Daily  . loratadine  10 mg Oral QPM  . metoprolol succinate  100 mg Oral Daily  . multivitamin with minerals  1 tablet Oral Daily  . pantoprazole  40 mg Oral Daily  . rivaroxaban  20 mg Oral Q supper  . saccharomyces boulardii  250 mg Oral Daily  . sodium chloride flush  3 mL Intravenous Q12H  . tamsulosin  0.4 mg Oral Daily  . topiramate  50 mg Oral BID   Continuous Infusions: . sodium chloride     PRN Meds: sodium chloride, acetaminophen, ALPRAZolam, diltiazem, nitroGLYCERIN, sodium chloride flush   Vital Signs    Vitals:   09/18/19 1556 09/18/19 2013 09/18/19 2137 09/19/19 0531  BP: 139/74 133/80 134/67 121/68  Pulse: 66 (!) 54 (!) 52 (!) 49  Resp: 18  18   Temp: 98.8 F (37.1 C) 97.7 F (36.5 C) 98.4 F (36.9 C) 98.2 F (36.8 C)  TempSrc: Oral Axillary Oral Oral  SpO2: 98% 97% 97% 98%  Weight: 123.4 kg   121 kg  Height: 6\' 1"  (1.854 m)       Intake/Output Summary (Last 24 hours) at 09/19/2019 0812 Last data filed at 09/19/2019 0500 Gross per 24 hour  Intake 577.31 ml  Output --  Net 577.31 ml   Filed Weights   09/18/19 1556 09/19/19 0531  Weight: 123.4 kg 121 kg    Physical Exam    GEN- The patient is well appearing, alert and oriented x 3 today.   Head- normocephalic, atraumatic Eyes-  Sclera  clear, conjunctiva pink Ears- hearing intact Oropharynx- clear Neck- supple Lungs- Clear to ausculation bilaterally, normal work of breathing Heart- Regular rate and rhythm, no murmurs, rubs or gallops GI- soft, NT, ND, + BS Extremities- no clubbing, cyanosis, or edema Skin- no rash or lesion Psych- euthymic mood, full affect Neuro- strength and sensation are intact  Labs    CBC No results for input(s): WBC, NEUTROABS, HGB, HCT, MCV, PLT in the last 72 hours. Basic Metabolic Panel Recent Labs    09/18/19 1138 09/19/19 0346  NA 140 140  K 4.5 3.9  CL 110 109  CO2 20* 21*  GLUCOSE 104* 108*  BUN 25* 25*  CREATININE 1.29* 1.21  CALCIUM 9.5 9.1  MG 2.0 2.2    Potassium  Date/Time Value Ref Range Status  09/19/2019 03:46 AM 3.9 3.5 - 5.1 mmol/L Final   Magnesium  Date/Time Value Ref Range Status  09/19/2019 03:46 AM 2.2 1.7 - 2.4 mg/dL Final    Comment:    Performed at [facility], [place] [address], [place], [place] [address]    Telemetry    NSR/Sinus brady 50-60s (personally reviewed)  Radiology    No  results found.   Patient Profile     Corey [name] is a 73 y.o. male with a past medical history significant for persistent atrial fibrillation.  They were admitted for tikosyn load.   Assessment & Plan    1. Persistent atrial fibrillation Pt remains in NSR on Tikosyn 500 mcg BID  Continue Xarelto K 3.9. Supp. Mg 2.2 CHA2DS2VASC is 5  Pt will not require DCCV.  For questions or updates, please contact [facility] Please consult [email or web address] for contact info under Cardiology/STEMI.  Signed, Shirley Friar, PA-C  09/19/2019, 8:12 AM   Remains in sinus.  Qt is stable.  Continue tikosyn load.  Thompson Grayer, MD

## 2019-09-20 LAB — BASIC METABOLIC PANEL
Anion gap: 9 (ref 5–15)
BUN: 25 mg/dL — ABNORMAL HIGH (ref 8–23)
CO2: 19 mmol/L — ABNORMAL LOW (ref 22–32)
Calcium: 9.2 mg/dL (ref 8.9–10.3)
Chloride: 111 mmol/L (ref 98–111)
Creatinine, Ser: 1.23 mg/dL (ref 0.61–1.24)
GFR calc Af Amer: >60 mL/min (ref >60)
GFR calc non Af Amer: 58 mL/min — ABNORMAL LOW (ref >60)
Glucose, Bld: 116 mg/dL — ABNORMAL HIGH (ref 70–99)
Potassium: 4 mmol/L (ref 3.5–5.1)
Sodium: 139 mmol/L (ref 135–145)

## 2019-09-20 LAB — MAGNESIUM: Magnesium: 2 mg/dL (ref 1.7–2.4)

## 2019-09-20 MED ORDER — METOPROLOL SUCCINATE ER 50 MG PO TB24
50.0000 mg | ORAL_TABLET | Freq: Every day | ORAL | Status: DC
  Administered 2019-09-21: 50 mg via ORAL
  Filled 2019-09-20: qty 1

## 2019-09-20 NOTE — TOC Benefit Eligibility Note (Signed)
Transition of Care (TOC) Benefit Eligibility Note    Patient Details  Name: Broughton A Tinch MRN: [id number] Date of Birth: 03/17/1946   Medication/Dose: TIKOSYN   125 MCG  250 MCG  500 MCG AND   DOFETILIDE  125 MCG  250 MCG  500MCG BID  Covered?: Yes  Tier: 3 Drug  Prescription [facility]age [facility]  Spoke with Person/Company/Phone Number:: MAR   @ [employer] # [phone]  Co-Pay: $50.00   FOR EACH PRESCRIPITION  Prior Approval: No  Deductible: Met  Additional Notes: BOTH MEDICATIONS  HAVE THE SAME CO-PAY-    [place], Dora Phone Number: 09/20/2019, 11:10 AM     

## 2019-09-20 NOTE — Progress Notes (Signed)
Pharmacy: Dofetilide (Tikosyn) - Follow Up Assessment and Electrolyte Replacement  Pharmacy consulted to assist in monitoring and replacing electrolytes in this 73 y.o. male admitted on 09/18/2019 undergoing dofetilide initiation.   Labs:    Component Value Date/Time   K 4.0 09/20/2019 0352   MG 2.0 09/20/2019 0352     Plan: Potassium: K >/= 4: No additional supplementation needed  Magnesium: Mg > 2: No additional supplementation needed   Thank you for allowing pharmacy to participate in this patient's care   Hildred Laser, PharmD Clinical Pharmacist **Pharmacist phone directory can now be found on [email or web address] (PW [email or web address]).  Listed under [place].

## 2019-09-20 NOTE — Progress Notes (Signed)
Morning EKG reviewed  Shows remains in NSR at 53 bpm with stable QTc at ~430-440 ms when measured manually and corrected for rate.  Continue Tikosyn 500 mcg BID.   Pt will not require DCCV   Annamaria Helling  Pager: [phone]  09/20/2019 12:06 PM

## 2019-09-20 NOTE — Progress Notes (Addendum)
Electrophysiology Rounding Note  Patient Name: Corey [name] Date of Encounter: 09/20/2019  Primary Cardiologist: Mertie Moores, MD  Electrophysiologist: Dr. Rayann Heman   Subjective   Pt remains in NSR on Tikosyn 500 mcg BID   QTc from EKG last pm shows stable QTc at ~480 ms  The patient is doing well today.  At this time, the patient denies chest pain, shortness of breath, or any new concerns.  Inpatient Medications    Scheduled Meds: . atorvastatin  40 mg Oral q1800  . dofetilide  500 mcg Oral BID  . fenofibrate  160 mg Oral Daily  . finasteride  5 mg Oral Daily  . fluticasone  1 spray Each Nare Daily  . lisinopril  10 mg Oral Daily  . loratadine  10 mg Oral QPM  . metoprolol succinate  100 mg Oral Daily  . multivitamin with minerals  1 tablet Oral Daily  . pantoprazole  40 mg Oral Daily  . rivaroxaban  20 mg Oral Q supper  . saccharomyces boulardii  250 mg Oral Daily  . sodium chloride flush  3 mL Intravenous Q12H  . tamsulosin  0.4 mg Oral Daily  . topiramate  50 mg Oral BID   Continuous Infusions: . sodium chloride     PRN Meds: sodium chloride, acetaminophen, ALPRAZolam, diltiazem, nitroGLYCERIN, sodium chloride flush   Vital Signs    Vitals:   09/19/19 0900 09/19/19 2017 09/20/19 0437 09/20/19 0825  BP: 123/70 129/73 125/69 (!) 147/81  Pulse:  63 (!) 55   Resp:      Temp:  98.1 F (36.7 C) 98.1 F (36.7 C)   TempSrc:  Oral Oral   SpO2:  96% 98% 98%  Weight:   120.8 kg   Height:        Intake/Output Summary (Last 24 hours) at 09/20/2019 0925 Last data filed at 09/19/2019 2000 Gross per 24 hour  Intake 684 ml  Output --  Net 684 ml   Filed Weights   09/18/19 1556 09/19/19 0531 09/20/19 0437  Weight: 123.4 kg 121 kg 120.8 kg    Physical Exam    GEN- The patient is well appearing, alert and oriented x 3 today.   Head- normocephalic, atraumatic Eyes-  Sclera clear, conjunctiva pink Ears- hearing intact Oropharynx- clear Neck- supple Lungs-  Clear to ausculation bilaterally, normal work of breathing Heart- Regular rate and rhythm, no murmurs, rubs or gallops GI- soft, NT, ND, + BS Extremities- no clubbing, cyanosis, or edema Skin- no rash or lesion Psych- euthymic mood, full affect Neuro- strength and sensation are intact  Labs    CBC No results for input(s): WBC, NEUTROABS, HGB, HCT, MCV, PLT in the last 72 hours. Basic Metabolic Panel Recent Labs    09/19/19 0346 09/20/19 0352  NA 140 139  K 3.9 4.0  CL 109 111  CO2 21* 19*  GLUCOSE 108* 116*  BUN 25* 25*  CREATININE 1.21 1.23  CALCIUM 9.1 9.2  MG 2.2 2.0    Potassium  Date/Time Value Ref Range Status  09/20/2019 03:52 AM 4.0 3.5 - 5.1 mmol/L Final   Magnesium  Date/Time Value Ref Range Status  09/20/2019 03:52 AM 2.0 1.7 - 2.4 mg/dL Final    Comment:    Performed at [facility], [place] [address], [place], [place] [address]    Telemetry    NSR 60-70s (personally reviewed)  Radiology    No results found.   Patient Profile     Corey [name] is a 73 y.o. male with a past medical history significant for persistent atrial fibrillation.  They were admitted for tikosyn load.   Assessment & Plan    1. Persistent atrial fibrillation Pt remains in NSR on Tikosyn 500 mcg BID  Continue Xarelto Electrolytes stable.  CHA2DS2VASC is 5  Pt will not require DCCV.   2. Sinus bradycardia Now rates dipping into upper 40s at times on high dose Toprol of 100 mg daily. Will decrease to 50 mg daily and follow rates.  For questions or updates, please contact [place] Please consult [email or web address] for contact info under Cardiology/STEMI.  Signed, Shirley Friar, PA-C  09/20/2019, 9:25 AM     I have seen, examined the patient, and reviewed the above assessment and plan.  Changes to above are made where necessary.  On exam, RRR.  Doing well with tikosyn load.  No changes today.  hopefully to be discharged tomorrow.  Co Sign: Thompson Grayer, MD

## 2019-09-20 NOTE — Care Management (Signed)
09-20-19 1236 Case Manager (CM) spoke with patient regarding Tikosyn- [facility] uses [facility]. Patient will need a Rx for 7 day supply no refills if continues on the 500 mcg sent to Transitions [facility] Care Pharmacy (TOC). Patient would like for a 30 day supply to be sent to [facility] initially. He will have a change in insurance Jan 1 [employer]- He will probably need a Rx for 90 day supply [facility] with refills under new insurance or a paper Rx. Thanks Bethena Roys, RN BSN Case Manager [phone]

## 2019-09-21 LAB — BASIC METABOLIC PANEL
Anion gap: 8 (ref 5–15)
BUN: 25 mg/dL — ABNORMAL HIGH (ref 8–23)
CO2: 21 mmol/L — ABNORMAL LOW (ref 22–32)
Calcium: 9.1 mg/dL (ref 8.9–10.3)
Chloride: 108 mmol/L (ref 98–111)
Creatinine, Ser: 1.28 mg/dL — ABNORMAL HIGH (ref 0.61–1.24)
GFR calc Af Amer: >60 mL/min (ref >60)
GFR calc non Af Amer: 56 mL/min — ABNORMAL LOW (ref >60)
Glucose, Bld: 120 mg/dL — ABNORMAL HIGH (ref 70–99)
Potassium: 4 mmol/L (ref 3.5–5.1)
Sodium: 137 mmol/L (ref 135–145)

## 2019-09-21 LAB — MAGNESIUM: Magnesium: 1.9 mg/dL (ref 1.7–2.4)

## 2019-09-21 MED ORDER — DOFETILIDE 500 MCG PO CAPS: 500.0000 ug | ORAL_CAPSULE | Freq: Two times a day (BID) | ORAL | 0 refills | Status: DC

## 2019-09-21 MED ORDER — METOPROLOL SUCCINATE ER 50 MG PO TB24: 50.0000 mg | ORAL_TABLET | Freq: Every day | ORAL | 3 refills | Status: DC

## 2019-09-21 MED ORDER — MAGNESIUM SULFATE 2 GM/50ML IV SOLN
2.0000 g | Freq: Once | INTRAVENOUS | Status: AC
  Administered 2019-09-21: 2 g via INTRAVENOUS
  Filled 2019-09-21: qty 50

## 2019-09-21 MED ORDER — MAGNESIUM OXIDE 400 MG PO TABS: 400.0000 mg | ORAL_TABLET | Freq: Every day | ORAL | 3 refills | Status: DC

## 2019-09-21 MED FILL — TIKOSYN 500 MCG CAPS: 500 | 7 days supply | Qty: 14 | Fill #0

## 2019-09-21 NOTE — Discharge Summary (Addendum)
ELECTROPHYSIOLOGY PROCEDURE DISCHARGE SUMMARY    Patient ID: Corey [name],  MRN: [id number], DOB/AGE: [birth year] 73 y.o.  Admit date: 09/18/2019 Discharge date: 09/21/2019  Primary Care Physician: [name], MD  Primary Cardiologist: Mertie Moores, MD  Electrophysiologist: None   Primary Discharge Diagnosis:  1.  Persistent atrial fibrillation status post Tikosyn loading this admission  Secondary Discharge Diagnosis:  2. Sinus bradycardia  Allergies  Allergen Reactions  . Amoxicillin Swelling  . Fosinopril Other (See Comments)  . Ampicillin Rash  . Codeine Nausea Only and Other (See Comments)    Heavy amounts cause nausea  . Monopril [Fosinopril Sodium] Other (See Comments)    Muscle aches & pain  . Dilaudid [Hydromorphone Hcl] Nausea Only  . Percocet [Oxycodone-Acetaminophen] Nausea And Vomiting     Procedures This Admission:  1.  Tikosyn loading  Brief HPI: Corey [name] is a 73 y.o. male with a past medical history as noted above.  They were referred to EP in the outpatient setting for treatment options of atrial fibrillation.  Risks, benefits, and alternatives to Tikosyn were reviewed with the patient who wished to proceed.    Hospital Course:  The patient was admitted and Tikosyn was initiated.  Renal function and electrolytes were followed during the hospitalization.  Their QTc remained stable. He did not require DCCV, as was in normal sinus on admission. They were monitored until discharge on telemetry which demonstrated Normal sinus and sinus brady, prompting adjustment of his Toprol. Pt required supplementation for magnesium, so started on oral tablets on discharge.  On the day of discharge they were examined by our team who considered them stable for discharge to home.  Follow-up has been arranged with the Atrial Fibrillation clinic in approximately 1 week and with Dr. Rayann Heman  in 4 weeks.     Physical Exam: Vitals:   09/20/19 2143 09/21/19 0443 09/21/19  0445 09/21/19 0806  BP: (!) 144/68 123/74  121/72  Pulse: (!) 54 (!) 50    Resp: 18 17    Temp: 97.6 F (36.4 C) 97.9 F (36.6 C)    TempSrc: Oral Oral    SpO2: 97% 98%    Weight:   120.7 kg   Height:        GEN- The patient is well appearing, alert and oriented x 3 today.   HEENT: normocephalic, atraumatic; sclera clear, conjunctiva pink; hearing intact; oropharynx clear; neck supple, no JVP Lymph- no cervical lymphadenopathy Lungs- Clear to ausculation bilaterally, normal work of breathing.  No wheezes, rales, rhonchi Heart- Regular rate and rhythm, no murmurs, rubs or gallops, PMI not laterally displaced GI- soft, non-tender, non-distended, bowel sounds present, no hepatosplenomegaly Extremities- no clubbing, cyanosis, or edema; DP/PT/radial pulses 2+ bilaterally MS- no significant deformity or atrophy Skin- warm and dry, no rash or lesion Psych- euthymic mood, full affect Neuro- strength and sensation are intact   Labs:   Lab Results  Component Value Date   WBC 6.9 09/07/2019   HGB 14.8 09/07/2019   HCT 44.8 09/07/2019   MCV 93.1 09/07/2019   PLT 265 09/07/2019    Recent Labs  Lab 09/21/19 0328  NA 137  K 4.0  CL 108  CO2 21*  BUN 25*  CREATININE 1.28*  CALCIUM 9.1  GLUCOSE 120*     Discharge Medications:  Allergies as of 09/21/2019      Reactions   Amoxicillin Swelling   Fosinopril Other (See Comments)   Ampicillin Rash   Codeine Nausea Only,  Other (See Comments)   Heavy amounts cause nausea   Monopril [fosinopril Sodium] Other (See Comments)   Muscle aches & pain   Dilaudid [hydromorphone Hcl] Nausea Only   Percocet [oxycodone-acetaminophen] Nausea And Vomiting      Medication List    TAKE these medications   ALPRAZolam 0.5 MG tablet Commonly known as: XANAX Take 0.5 mg by mouth daily as needed for anxiety.   atorvastatin 40 MG tablet Commonly known as: LIPITOR Take 1 tablet (40 mg total) by mouth daily.   diltiazem 30 MG  tablet Commonly known as: Cardizem Take 1 tablet (30 mg total) by mouth 4 (four) times daily as needed.   docusate sodium 100 MG capsule Commonly known as: COLACE Take 100 mg by mouth 2 (two) times daily.   dofetilide 500 MCG capsule Commonly known as: TIKOSYN Take 1 capsule (500 mcg total) by mouth 2 (two) times daily.   dofetilide 500 MCG capsule Commonly known as: Tikosyn Take 1 capsule (500 mcg total) by mouth 2 (two) times daily.   fenofibrate 160 MG tablet TAKE 1 TABLET BY MOUTH EVERY DAY   finasteride 5 MG tablet Commonly known as: PROSCAR Take 5 mg by mouth daily.   Flomax 0.4 MG Caps capsule Generic drug: tamsulosin Take 0.4 mg by mouth daily.   fluticasone 50 MCG/ACT nasal spray Commonly known as: FLONASE Place 2 sprays into the nose daily.   levocetirizine 5 MG tablet Commonly known as: XYZAL Take 5 mg by mouth every evening.   lisinopril 10 MG tablet Commonly known as: ZESTRIL Take 10 mg by mouth daily.   magnesium oxide 400 MG tablet Commonly known as: MAG-OX Take 1 tablet (400 mg total) by mouth daily.   metoprolol succinate 50 MG 24 hr tablet Commonly known as: TOPROL-XL Take 1 tablet (50 mg total) by mouth daily. Take with or immediately following a meal. Start taking on: September 22, 2019 What changed: how much to take   multivitamins ther. w/minerals Tabs tablet Take 1 tablet by mouth daily.   nitroGLYCERIN 0.4 MG SL tablet Commonly known as: NITROSTAT Place 1 tablet (0.4 mg total) under the tongue every 5 (five) minutes as needed for chest pain.   omeprazole 10 MG capsule Commonly known as: PRILOSEC Take 1 capsule (10 mg total) by mouth daily.   potassium chloride 10 MEQ tablet Commonly known as: KLOR-CON Take 1 tablet (10 mEq total) by mouth daily.   saccharomyces boulardii 250 MG capsule Commonly known as: FLORASTOR Take 250 mg by mouth daily. [facility] Brand   sildenafil 20 MG tablet Commonly known as: REVATIO Take 2 to 5 tabs  daily as needed for erectile dysfunction What changed:   when to take this  reasons to take this  additional instructions   topiramate 50 MG tablet Commonly known as: TOPAMAX TAKE 1 TABLET BY MOUTH TWICE A DAY   Tylenol 8 Hour Arthritis Pain 650 MG CR tablet Generic drug: acetaminophen Take 1,300 mg by mouth daily.   Xarelto 20 MG Tabs tablet Generic drug: rivaroxaban TAKE 1 TABLET BY MOUTH EVERY DAY       Disposition:   Follow-up Information    Thompson Grayer, MD Follow up on 10/29/2019.   Specialty: Cardiology Why: at 1030 am for a virtual visit s/p tikosyn loading Contact information: [address] [place]  [id number] [phone]        [place] Follow up on 10/01/2019.   Specialty: Cardiology Why: at 230 pm  for post tikosyn load.  Contact information: [address] [id number] [place] [address] [phone]         Duration of Discharge Encounter: Greater than 30 minutes including physician time.  Jacalyn Lefevre, PA-C  09/21/2019 12:53 PM  Doing well post tikosyn load.  Qt is stable.  Will DC to home with close follow-up in the AF clinic Thompson Grayer, MD

## 2019-09-21 NOTE — Plan of Care (Signed)
  Problem: Education: Goal: Knowledge of disease or condition will improve Outcome: Progressing Goal: Understanding of medication regimen will improve Outcome: Progressing Goal: Individualized Educational Video(s) Outcome: Progressing   Problem: Activity: Goal: Ability to tolerate increased activity will improve Outcome: Progressing   Problem: Cardiac: Goal: Ability to achieve and maintain adequate cardiopulmonary perfusion will improve Outcome: Progressing   Problem: Health Behavior/Discharge Planning: Goal: Ability to safely manage health-related needs after discharge will improve Outcome: Progressing   Problem: Education: Goal: Knowledge of General Education information will improve Description: Including pain rating scale, medication(s)/side effects and non-pharmacologic comfort measures Outcome: Progressing   Problem: Health Behavior/Discharge Planning: Goal: Ability to manage health-related needs will improve Outcome: Progressing   Problem: Clinical Measurements: Goal: Ability to maintain clinical measurements within normal limits will improve Outcome: Progressing Goal: Will remain free from infection Outcome: Progressing Goal: Diagnostic test results will improve Outcome: Progressing Goal: Respiratory complications will improve Outcome: Progressing Goal: Cardiovascular complication will be avoided Outcome: Progressing   Problem: Activity: Goal: Risk for activity intolerance will decrease Outcome: Progressing   Problem: Nutrition: Goal: Adequate nutrition will be maintained Outcome: Progressing   Problem: Coping: Goal: Level of anxiety will decrease Outcome: Progressing   Problem: Elimination: Goal: Will not experience complications related to bowel motility Outcome: Progressing Goal: Will not experience complications related to urinary retention Outcome: Progressing   Problem: Pain Managment: Goal: General experience of comfort will improve Outcome:  Progressing   Problem: Safety: Goal: Ability to remain free from injury will improve Outcome: Progressing   Problem: Skin Integrity: Goal: Risk for impaired skin integrity will decrease Outcome: Progressing  Elesa Hacker, RN

## 2019-09-21 NOTE — Care Management Important Message (Signed)
Important Message  Patient Details  Name: GILL DELROSSI MRN: [id number] Date of Birth: 1946/08/26   Medicare Important Message Given:  Yes     Shelda Altes 09/21/2019, 12:23 PM

## 2019-09-21 NOTE — Progress Notes (Signed)
Pharmacy: Dofetilide (Tikosyn) - Follow Up Assessment and Electrolyte Replacement  Pharmacy consulted to assist in monitoring and replacing electrolytes in this 73 y.o. male admitted on 09/18/2019 undergoing dofetilide initiation.   Labs:    Component Value Date/Time   K 4.0 09/21/2019 0328   MG 1.9 09/21/2019 0328     Plan: Potassium: K >/= 4: No additional supplementation needed  Magnesium: Mg 1.8-2: Give Mg 2 gm IV x1    Thank you for allowing pharmacy to participate in this patient's care   Hildred Laser, PharmD Clinical Pharmacist **Pharmacist phone directory can now be found on [email or web address] ([email or web address]).  Listed under [place].

## 2019-09-21 NOTE — Progress Notes (Signed)
  Last evening EKG reviewed  Shows remains in NSR/Sinus brady at 55 bpm with stable QTc at ~480 ms .  Continue Tikosyn 500 mcg BID.   Discharge pending morning dose and EKG.   Shirley Friar, PA-C  Pager: [phone]  09/21/2019 8:14 AM

## 2019-10-01 ENCOUNTER — Other Ambulatory Visit: Payer: Self-pay

## 2019-10-01 ENCOUNTER — Ambulatory Visit (HOSPITAL_COMMUNITY)
Admit: 2019-10-01 | Discharge: 2019-10-01 | Disposition: A | Discharge status: Home / Self Care | Payer: Medicare Other | Source: Ambulatory Visit | Attending: Physician Assistant | Admitting: Physician Assistant

## 2019-10-01 VITALS — BP 150/76 | HR 67 | Ht 73.0 in | Wt 275.0 lb

## 2019-10-01 DIAGNOSIS — Z79899 Other long term (current) drug therapy: Secondary | ICD-10-CM | POA: Insufficient documentation

## 2019-10-01 DIAGNOSIS — Z7951 Long term (current) use of inhaled steroids: Secondary | ICD-10-CM | POA: Diagnosis not present

## 2019-10-01 DIAGNOSIS — Z87891 Personal history of nicotine dependence: Secondary | ICD-10-CM | POA: Diagnosis not present

## 2019-10-01 DIAGNOSIS — Z96652 Presence of left artificial knee joint: Secondary | ICD-10-CM | POA: Insufficient documentation

## 2019-10-01 DIAGNOSIS — I480 Paroxysmal atrial fibrillation: Secondary | ICD-10-CM | POA: Insufficient documentation

## 2019-10-01 DIAGNOSIS — I4891 Unspecified atrial fibrillation: Secondary | ICD-10-CM | POA: Diagnosis present

## 2019-10-01 DIAGNOSIS — K219 Gastro-esophageal reflux disease without esophagitis: Secondary | ICD-10-CM | POA: Diagnosis not present

## 2019-10-01 DIAGNOSIS — Z981 Arthrodesis status: Secondary | ICD-10-CM | POA: Insufficient documentation

## 2019-10-01 DIAGNOSIS — Z6836 Body mass index (BMI) 36.0-36.9, adult: Secondary | ICD-10-CM | POA: Diagnosis not present

## 2019-10-01 DIAGNOSIS — Z880 Allergy status to penicillin: Secondary | ICD-10-CM | POA: Insufficient documentation

## 2019-10-01 DIAGNOSIS — Z885 Allergy status to narcotic agent status: Secondary | ICD-10-CM | POA: Insufficient documentation

## 2019-10-01 DIAGNOSIS — M1990 Unspecified osteoarthritis, unspecified site: Secondary | ICD-10-CM | POA: Diagnosis not present

## 2019-10-01 DIAGNOSIS — E669 Obesity, unspecified: Secondary | ICD-10-CM | POA: Insufficient documentation

## 2019-10-01 DIAGNOSIS — E785 Hyperlipidemia, unspecified: Secondary | ICD-10-CM | POA: Insufficient documentation

## 2019-10-01 DIAGNOSIS — D6869 Other thrombophilia: Secondary | ICD-10-CM

## 2019-10-01 DIAGNOSIS — Z888 Allergy status to other drugs, medicaments and biological substances status: Secondary | ICD-10-CM | POA: Diagnosis not present

## 2019-10-01 DIAGNOSIS — G4733 Obstructive sleep apnea (adult) (pediatric): Secondary | ICD-10-CM | POA: Insufficient documentation

## 2019-10-01 DIAGNOSIS — Z955 Presence of coronary angioplasty implant and graft: Secondary | ICD-10-CM | POA: Diagnosis not present

## 2019-10-01 DIAGNOSIS — Z7901 Long term (current) use of anticoagulants: Secondary | ICD-10-CM | POA: Insufficient documentation

## 2019-10-01 DIAGNOSIS — I10 Essential (primary) hypertension: Secondary | ICD-10-CM | POA: Insufficient documentation

## 2019-10-01 DIAGNOSIS — I2510 Atherosclerotic heart disease of native coronary artery without angina pectoris: Secondary | ICD-10-CM | POA: Diagnosis not present

## 2019-10-01 LAB — BASIC METABOLIC PANEL
Anion gap: 10 (ref 5–15)
BUN: 21 mg/dL (ref 8–23)
CO2: 22 mmol/L (ref 22–32)
Calcium: 9.6 mg/dL (ref 8.9–10.3)
Chloride: 107 mmol/L (ref 98–111)
Creatinine, Ser: 1.17 mg/dL (ref 0.61–1.24)
GFR calc Af Amer: >60 mL/min (ref >60)
GFR calc non Af Amer: >60 mL/min (ref >60)
Glucose, Bld: 124 mg/dL — ABNORMAL HIGH (ref 70–99)
Potassium: 4.3 mmol/L (ref 3.5–5.1)
Sodium: 139 mmol/L (ref 135–145)

## 2019-10-01 LAB — MAGNESIUM: Magnesium: 1.8 mg/dL (ref 1.7–2.4)

## 2019-10-01 NOTE — Progress Notes (Addendum)
Primary Care Physician: [name], MD Primary Cardiologist: Dr Acie Fredrickson Primary Electrophysiologist: Dr Rayann Heman Referring Physician: Dr Deatra Canter is a 73 y.o. male with a history of CAD, HTN, OSA, CVA, paroxysmal atrial fibrillation, and HLD who presents for follow up in the [facility].  The patient was initially diagnosed with atrial fibrillation in 2016 after presenting with symptoms of presyncope. He converted on IV diltiazem and has been maintained on Xarelto. Patient had done well for years until 07/06/19 when he presented to the ER with symptoms of palpitations and chest pressure and found to be in afib vs atrial flutter with RVR. He converted while being evaluated in the ER and his symptoms resolved. Since then, he has not had any further symptoms. He denies any specific triggers that he could identify. He has a diagnosis of OSA and is compliant with his CPAP. He denies significant alcohol use. He is on Xarelto for a CHADS2VASC score of 5. Patient had another episode of afib on 09/07/19. There were no specific triggers that the patient could identify. He took the PRN diltiazem without much relief. He presented to the ER in afib with RVR and underwent successful DCCV.   On follow up today, patient is s/p dofetilide loading 12/15-12/18/20. He reports that he initially had some afternoon fatigue but this has resolved in the last couple days. He has smart device technology to check an ECG which has shown SR. He denies any heart racing or palpitations.   Today, he denies symptoms of palpitations, chest pain, shortness of breath, orthopnea, PND, lower extremity edema, dizziness, presyncope, syncope, snoring, daytime somnolence, bleeding, or neurologic sequela. The patient is tolerating medications without difficulties and is otherwise without complaint today.    Atrial Fibrillation Risk Factors:  he does have symptoms or diagnosis of sleep apnea. he is  compliant with CPAP therapy. he does not have a history of rheumatic fever. he does not have a history of alcohol use. The patient does not have a history of early familial atrial fibrillation or other arrhythmias.  he has a BMI of Body mass index is 36.28 kg/m.Marland Kitchen Filed Weights   10/01/19 1439  Weight: 124.7 kg    Family History  Problem Relation Age of Onset  . Hypertension Mother   . Heart disease Father   . Heart attack Father      Atrial Fibrillation Management history:  Previous antiarrhythmic drugs: dofetilide Previous cardioversions: 09/07/19 Previous ablations: none CHADS2VASC score: 5 Anticoagulation history: Xarelto    Past Medical History:  Diagnosis Date  . Arthritis    "back, right knee, hands, ankles, neck" (05/31/2016)  . Atrial fibrillation with RVR ([place])   . Carotid artery disease ([place]) 08/30/2016   Carotid US 11/19: R 1-39, L 40-59 // Carotid US 08/2019: R 1-39; L 40-59; repeat 1 year  . Chronic lower back pain   . Coronary atherosclerosis of native coronary artery    a. BMS to [facility] 2004 and 2007, otherwise mild nonobstructive disease. EF normal.  . Diverticulitis   . Dyslipidemia   . Essential hypertension, benign   . GERD (gastroesophageal reflux disease)   . Lumbar radiculopathy, chronic 02/04/2015   Right L5  . Obesity   . OSA on CPAP   . Paroxysmal atrial fibrillation ([place])    a. Discovered after stroke.  . Pneumonia 01/1996  . PONV (postoperative nausea and vomiting)   . Recurrent upper respiratory infection (URI)   . Visit for  monitoring Tikosyn therapy 09/18/2019   Past Surgical History:  Procedure Laterality Date  . ANTERIOR CERVICAL DECOMP/DISCECTOMY FUSION  07/2001; 10/2002   "C5-6; C6-7; redo"  . BACK SURGERY    . CARPAL TUNNEL RELEASE Left 10/2015  . COLONOSCOPY W/ POLYPECTOMY  02/2014  . CORONARY ANGIOPLASTY WITH STENT PLACEMENT  05/2003; 12/2005   "mid RCA; mid RCA"  . HAND SURGERY  02/2019   LEFT HAND  . JOINT REPLACEMENT    .  KNEE ARTHROSCOPY Left 10/2005  . KNEE ARTHROSCOPY W/ PARTIAL MEDIAL MENISCECTOMY Left 09/2005  . LUMBAR LAMINECTOMY/DECOMPRESSION MICRODISCECTOMY  03/2005   "L4-5"  . POSTERIOR LUMBAR FUSION  10/2003   L5-S1; "plates, screws"  . SHOULDER ARTHROSCOPY Right 08/2011   Debridement of labrum, arthroscopic distal clavicle excision  . SHOULDER OPEN ROTATOR CUFF REPAIR Left 07/2014  . TEE WITHOUT CARDIOVERSION  07/07/2012   Procedure: TRANSESOPHAGEAL ECHOCARDIOGRAM (TEE);  Surgeon: Fay Records, MD;  Location: [place] [place];  Service: Cardiovascular;  Laterality: N/A;  . TONSILLECTOMY AND ADENOIDECTOMY  ~ 1956  . TOTAL KNEE ARTHROPLASTY Left 10/2006  . TRIGGER FINGER RELEASE Left 10/2015    Current Outpatient Medications  Medication Sig Dispense Refill  . acetaminophen (TYLENOL 8 HOUR ARTHRITIS PAIN) 650 MG CR tablet Take 1,300 mg by mouth as needed for pain (pain).     Marland Kitchen ALPRAZolam (XANAX) 0.5 MG tablet Take 0.5 mg by mouth as needed for anxiety.   1  . atorvastatin (LIPITOR) 40 MG tablet Take 1 tablet (40 mg total) by mouth daily. 90 tablet 3  . clindamycin (CLEOCIN) 300 MG capsule clindamycin HCl 300 mg capsule  TAKE 2 CAPSULES BY MOUTH 1 HOUR PRIOR TO DENTAL TREATMENT    . diltiazem (CARDIZEM) 30 MG tablet Take 1 tablet (30 mg total) by mouth 4 (four) times daily as needed. 60 tablet 6  . docusate sodium (COLACE) 100 MG capsule Take 100 mg by mouth 2 (two) times daily.    Marland Kitchen dofetilide (TIKOSYN) 500 MCG capsule Take 1 capsule (500 mcg total) by mouth 2 (two) times daily. 60 capsule 0  . fenofibrate 160 MG tablet TAKE 1 TABLET BY MOUTH EVERY DAY 90 tablet 3  . finasteride (PROSCAR) 5 MG tablet Take 5 mg by mouth daily.     . fluticasone (FLONASE) 50 MCG/ACT nasal spray Place 2 sprays into the nose daily.     Marland Kitchen levocetirizine (XYZAL) 5 MG tablet Take 5 mg by mouth every evening.    Marland Kitchen lisinopril (ZESTRIL) 10 MG tablet Take 10 mg by mouth daily.     . magnesium oxide (MAG-OX) 400 MG tablet Take 1  tablet (400 mg total) by mouth daily. 90 tablet 3  . metoprolol succinate (TOPROL-XL) 50 MG 24 hr tablet Take 1 tablet (50 mg total) by mouth daily. Take with or immediately following a meal. 90 tablet 3  . Multiple Vitamins-Minerals (MULTIVITAMINS THER. W/MINERALS) TABS Take 1 tablet by mouth daily.      . nitroGLYCERIN (NITROSTAT) 0.4 MG SL tablet Place 1 tablet (0.4 mg total) under the tongue every 5 (five) minutes as needed for chest pain. 25 tablet 5  . omeprazole (PRILOSEC) 10 MG capsule Take 1 capsule (10 mg total) by mouth daily. 90 capsule 3  . potassium chloride (K-DUR) 10 MEQ tablet Take 1 tablet (10 mEq total) by mouth daily. 90 tablet 3  . saccharomyces boulardii (FLORASTOR) 250 MG capsule Take 250 mg by mouth daily. [facility] Brand    . sildenafil (REVATIO) 20 MG  tablet Take 2 to 5 tabs daily as needed for erectile dysfunction (Patient taking differently: as needed. ) 30 tablet 2  . Tamsulosin HCl (FLOMAX) 0.4 MG CAPS Take 0.4 mg by mouth daily.      Marland Kitchen topiramate (TOPAMAX) 50 MG tablet TAKE 1 TABLET BY MOUTH TWICE A DAY 180 tablet 0  . XARELTO 20 MG TABS tablet TAKE 1 TABLET BY MOUTH EVERY DAY 90 tablet 1   No current facility-administered medications for this encounter.    Allergies  Allergen Reactions  . Amoxicillin Swelling  . Fosinopril Other (See Comments)  . Ampicillin Rash  . Codeine Nausea Only and Other (See Comments)    Heavy amounts cause nausea  . Monopril [Fosinopril Sodium] Other (See Comments)    Muscle aches & pain  . Dilaudid [Hydromorphone Hcl] Nausea Only  . Percocet [Oxycodone-Acetaminophen] Nausea And Vomiting    Social History   Socioeconomic History  . Marital status: Married    Spouse name: [name]  . Number of children: 3  . Years of education: 25  . Highest education level: Not on file  Occupational History  . Occupation: Retired    Fish farm manager: DISABLED     Comment: Works at [employer] part time  Tobacco Use  . Smoking status: Former Smoker     Packs/day: 1.00    Years: 34.00    Pack years: 34.00    Types: Cigarettes    Quit date: 05/05/2003    Years since quitting: 16.4  . Smokeless tobacco: Never Used  Substance and Sexual Activity  . Alcohol use: No    Alcohol/week: 0.0 standard drinks  . Drug use: No  . Sexual activity: Yes  Other Topics Concern  . Not on file  Social History Narrative   Patient lives at home with spouse.   Caffeine Use:    Social Determinants of Health   Financial Resource Strain:   . Difficulty of Paying Living Expenses: Not on file  Food Insecurity:   . Worried About Charity fundraiser in the Last Year: Not on file  . Ran Out of Food in the Last Year: Not on file  Transportation Needs:   . Lack of Transportation (Medical): Not on file  . Lack of Transportation (Non-Medical): Not on file  Physical Activity:   . Days of Exercise per Week: Not on file  . Minutes of Exercise per Session: Not on file  Stress:   . Feeling of Stress : Not on file  Social Connections:   . Frequency of Communication with Friends and Family: Not on file  . Frequency of Social Gatherings with Friends and Family: Not on file  . Attends Religious Services: Not on file  . Active Member of Clubs or Organizations: Not on file  . Attends Archivist Meetings: Not on file  . Marital Status: Not on file  Intimate Partner Violence:   . Fear of Current or Ex-Partner: Not on file  . Emotionally Abused: Not on file  . Physically Abused: Not on file  . Sexually Abused: Not on file     ROS- All systems are reviewed and negative except as per the HPI above.  Physical Exam: Vitals:   10/01/19 1439  BP: (!) 150/76  Pulse: 67  Weight: 124.7 kg  Height: 6\' 1"  (1.854 m)    GEN- The patient is well appearing obese male, alert and oriented x 3 today.   HEENT-head normocephalic, atraumatic, sclera clear, conjunctiva pink, hearing intact, trachea midline. Lungs-  Clear to ausculation bilaterally, normal work of  breathing Heart- Regular rate and rhythm, no murmurs, rubs or gallops  GI- soft, NT, ND, + BS Extremities- no clubbing, cyanosis, or edema MS- no significant deformity or atrophy Skin- no rash or lesion Psych- euthymic mood, full affect Neuro- strength and sensation are intact   Wt Readings from Last 3 Encounters:  10/01/19 124.7 kg  09/21/19 120.7 kg  09/18/19 123.5 kg    EKG today demonstrates SR HR 67, inc RBBB, PR 202, QRS 100, QTc 473  Echo 07/19/19 demonstrated   1. Left ventricular ejection fraction, by visual estimation, is 60 to 65%. The left ventricle has normal function. Normal left ventricular size. There is mildly increased left ventricular hypertrophy.  2. Definity contrast agent was given IV to delineate the left ventricular endocardial borders.  3. Global right ventricle has normal systolic function.The right ventricular size is normal. No increase in right ventricular wall thickness.  4. Left atrial size was mildly dilated.  5. Right atrial size was normal.  6. The mitral valve is normal in structure. Trace mitral valve regurgitation. No evidence of mitral stenosis.  7. The tricuspid valve is normal in structure. Tricuspid valve regurgitation was not visualized by color flow Doppler.  8. The aortic valve is tricuspid Aortic valve regurgitation was not visualized by color flow Doppler. Mild aortic valve sclerosis without stenosis.  9. The pulmonic valve was normal in structure. Pulmonic valve regurgitation is not visualized by color flow Doppler. 10. The inferior vena cava is normal in size with greater than 50% respiratory variability, suggesting right atrial pressure of 3 mmHg.  Epic records are reviewed at length today  Assessment and Plan:  1. Paroxysmal atrial fibrillation S/p dofetilide loading 12/15-12/18/20. Patient appears to be maintaining SR. Continue dofetilide 500 mcg BID. QT stable. Check Bmet/mag today. Continue Xarelto 20 mg daily.  Continue  Toprol 100 mg BID Continue diltiazem 30 mg PRN q4hrs for heart racing.  This patients CHA2DS2-VASc Score and unadjusted Ischemic Stroke Rate (% per year) is equal to 7.2 % stroke rate/year from a score of 5  Above score calculated as 1 point each if present [CHF, HTN, DM, Vascular=MI/PAD/Aortic Plaque, Age if 65-74, or Male] Above score calculated as 2 points each if present [Age > 75, or Stroke/TIA/TE]   2. Obesity Body mass index is 36.28 kg/m. Lifestyle modification was discussed and encouraged including regular physical activity and weight reduction.  3. Obstructive sleep apnea The importance of adequate treatment of sleep apnea was discussed today in order to improve our ability to maintain sinus rhythm long term. Patient reports compliance with CPAP therapy.  4. CAD No anginal symptoms. Plans for myoview noted.  Followed by Dr Acie Fredrickson.    5. HTN Stable, no changes today.   Follow up with Dr Rayann Heman as scheduled.    [facility] [address] [place], [place] [address] [phone] 10/01/2019 4:24 PM

## 2019-10-07 ENCOUNTER — Other Ambulatory Visit: Payer: Self-pay | Admitting: Cardiovascular Disease

## 2019-10-08 NOTE — Telephone Encounter (Signed)
Xarelto 20mg  refill request received. Pt is 74 years old, weight-124.7 kg, Crea-1.17 on 10/01/2019, last seen by Clint Fenton on 10/01/2019, Diagnosis-Afib, CrCl-100.28ml/min; Dose is appropriate based on dosing criteria. Will send in refill to requested pharmacy.

## 2019-10-10 ENCOUNTER — Other Ambulatory Visit: Payer: Self-pay

## 2019-10-10 ENCOUNTER — Ambulatory Visit: Payer: Medicare PPO | Admitting: Adult Health

## 2019-10-10 ENCOUNTER — Telehealth (HOSPITAL_COMMUNITY): Payer: Self-pay | Admitting: *Deleted

## 2019-10-10 ENCOUNTER — Encounter: Payer: Self-pay | Admitting: Adult Health

## 2019-10-10 VITALS — BP 135/73 | HR 64 | Temp 97.7°F | Ht 73.0 in | Wt 275.8 lb

## 2019-10-10 DIAGNOSIS — G459 Transient cerebral ischemic attack, unspecified: Secondary | ICD-10-CM

## 2019-10-10 DIAGNOSIS — G250 Essential tremor: Secondary | ICD-10-CM

## 2019-10-10 DIAGNOSIS — I480 Paroxysmal atrial fibrillation: Secondary | ICD-10-CM

## 2019-10-10 DIAGNOSIS — R2689 Other abnormalities of gait and mobility: Secondary | ICD-10-CM | POA: Diagnosis not present

## 2019-10-10 DIAGNOSIS — R29818 Other symptoms and signs involving the nervous system: Secondary | ICD-10-CM

## 2019-10-10 MED ORDER — TOPIRAMATE 50 MG PO TABS: ORAL_TABLET | ORAL | 3 refills | Status: DC

## 2019-10-10 NOTE — Patient Instructions (Addendum)
Your Plan:  Recommend increasing Topamax to 50 mg a.m. and 100 mg p.m.   Obtain MRI to rule out any abnormality potentially causing tremors        Thank you for coming to see [place] at [facility]. I hope we have been able to provide you high quality care today.  You may receive a patient satisfaction survey over the next few weeks. We would appreciate your feedback and comments so that we may continue to improve ourselves and the health of our patients.

## 2019-10-10 NOTE — Telephone Encounter (Signed)
Patient given detailed instructions per Myocardial Perfusion Study Information Sheet for the test on 10/14/18 at 8:15. Patient notified to arrive 15 minutes early and that it is imperative to arrive on time for appointment to keep from having the test rescheduled.  If you need to cancel or reschedule your appointment, please call the office within 24 hours of your appointment. . Patient verbalized understanding.Veronia Beets

## 2019-10-10 NOTE — Progress Notes (Signed)
[name] [address] [place]. [place] [address] [phone]       OFFICE FOLLOW UP NOTE  Corey [name] Date of Birth:  1945-12-16 Medical Record Number:  [id number]   Referring MD: Chriss Czar provider: Dr. Leonie Man  Reason for Referral: TIA and essential tremor  Chief Complaint  Patient presents with  . Follow-up    3 mon f/u. Alone. Treatment room. Patient mentioned that he is still having trouble with tremors and his balance. He also mentioned that he is having some weakness. He stated that he does stumble and drags his feet. He is concerned that he may have Parkinsons     HPI:   Initial visit 06/25/2019 Dr. Leonie Man: Corey [name] is a 74 year old Caucasian male with past medical history of hypertension, hyperlipidemia, coronary artery disease status post PTCA stenting, history of left MCA embolic infarct in 5573 due to atrial fibrillation, obstructive sleep apnea, arthritis, gastroesophageal reflux disease, remote smoking and obesity who is referred for evaluation for an episode of possible TIA.  History is obtained from the patient, review of electronic medical records and I personally reviewed imaging films in PACS.  Patient states that he was driving in [facility] on 06/02/2019 when all of a sudden he did not feel well.  He felt that he may pass out vision was getting blurred he was having a mild headache.  He managed to pull over the car and ask wife to take over.  He had a portable EKG monitor which showed that his pulse rate was 73 and rhythm was okay.  But his blood pressure was elevated at 195 95.  He waited about 5 minutes and retook the blood pressure it was still high at 185/95.  They decided to go over to the local ER where he had further work-up including checking blood sugar which was 128 mg percent.  Patient at that time was also feeling cold and clammy and sweaty.  He was evaluated at [facility] where CT scan of the head  as well as basic lab work were obtained which were all normal.  Patient states that he had somewhat similar episode before he had his previous stroke in 2013.  He has atrial fibrillation and has been taking Xarelto quite faithfully with his dinner every night without fail.  He states he had last lipid profile and A1c checked in December 2019 by his primary physician and they were satisfactory and he is plans to see him next week to have them repeated.  He does use his CPAP every night regularly.  He is currently on a low-carb diet and wants to lose some weight.  He did have a recent tele-visit with his cardiologist who added hydrochlorothiazide for blood pressure control and his blood pressure is doing better now running 220-254 systolic.  The patient has had longstanding history of tremors in his left hand probably 3 years or longer.  He is was diagnosed with essential tremor in fact was referred by me to Dr. Rexene Alberts on 11/07/2017 for second opinion who also felt it was essential tremor rather than Parkinson's.  Patient did not tolerate primidone well and it made him feel loopy and stopped it.  He also complained of a lot of fatigue and tiredness on Inderal which was discontinued.  He states that he is noticed increasing tremor as well as some drooling.  His brother was recently diagnosed with atypical Parkinson's and patient wonders if he has the same.  Update 10/10/2019: Corey [name] is a 74 year old male who is being seen today for 46-month follow-up.  He has been stable from a stroke standpoint without any reoccurring or new stroke/TIA symptoms.  He was initiated on Topamax at prior visit due to likely essential tremor.  He states he has seen some improvement with use of Topamax 50 mg twice daily but he will have episodes where his tremors will be worse with increased fatigue and energy towards the afternoon.  He also endorses worsening with stressful events.  He has also been having difficulty with balance where he  will stumble at times, kick the edge of the step when going upstairs and has difficulty making turns while ambulating.  This occurs more with his right foot.  He is overall very frustrated as he is concerned regarding possibly having Parkinson's disease as his brother has been recently diagnosed and he wants to ensure that he does not have Parkinson's.  He is frustrated as he has not underwent any type of imaging or testing to rule this out.  He continues on Xarelto for secondary stroke prevention history of atrial fibrillation without bleeding or bruising.  Continues on atorvastatin without myalgias.  Blood pressure today 135/73.  He continues to use CPAP for OSA management.  He continues to follow with cardiology regularly.  No concerns at this time.    ROS:   14 system review of systems is positive for tremors, imbalance and all other systems negative  PMH:  Past Medical History:  Diagnosis Date  . Arthritis    "back, right knee, hands, ankles, neck" (05/31/2016)  . Atrial fibrillation with RVR ([place])   . Carotid artery disease ([place]) 08/30/2016   Carotid US 11/19: R 1-39, L 40-59 // Carotid US 08/2019: R 1-39; L 40-59; repeat 1 year  . Chronic lower back pain   . Coronary atherosclerosis of native coronary artery    a. BMS to [facility] 2004 and 2007, otherwise mild nonobstructive disease. EF normal.  . Diverticulitis   . Dyslipidemia   . Essential hypertension, benign   . GERD (gastroesophageal reflux disease)   . Lumbar radiculopathy, chronic 02/04/2015   Right L5  . Obesity   . OSA on CPAP   . Paroxysmal atrial fibrillation ([place])    a. Discovered after stroke.  . Pneumonia 01/1996  . PONV (postoperative nausea and vomiting)   . Recurrent upper respiratory infection (URI)   . Visit for monitoring Tikosyn therapy 09/18/2019    Social History:  Social History   Socioeconomic History  . Marital status: Married    Spouse name: [name]  . Number of children: 3  . Years of education: 24    . Highest education level: Not on file  Occupational History  . Occupation: Retired    Fish farm manager: DISABLED     Comment: Works at [employer] part time  Tobacco Use  . Smoking status: Former Smoker    Packs/day: 1.00    Years: 34.00    Pack years: 34.00    Types: Cigarettes    Quit date: 05/05/2003    Years since quitting: 16.4  . Smokeless tobacco: Never Used  Substance and Sexual Activity  . Alcohol use: No    Alcohol/week: 0.0 standard drinks  . Drug use: No  . Sexual activity: Yes  Other Topics Concern  . Not on file  Social History Narrative   Patient lives at home with spouse.   Caffeine Use:    Social Determinants of Health  Financial Resource Strain:   . Difficulty of Paying Living Expenses: Not on file  Food Insecurity:   . Worried About Charity fundraiser in the Last Year: Not on file  . Ran Out of Food in the Last Year: Not on file  Transportation Needs:   . Lack of Transportation (Medical): Not on file  . Lack of Transportation (Non-Medical): Not on file  Physical Activity:   . Days of Exercise per Week: Not on file  . Minutes of Exercise per Session: Not on file  Stress:   . Feeling of Stress : Not on file  Social Connections:   . Frequency of Communication with Friends and Family: Not on file  . Frequency of Social Gatherings with Friends and Family: Not on file  . Attends Religious Services: Not on file  . Active Member of Clubs or Organizations: Not on file  . Attends Archivist Meetings: Not on file  . Marital Status: Not on file  Intimate Partner Violence:   . Fear of Current or Ex-Partner: Not on file  . Emotionally Abused: Not on file  . Physically Abused: Not on file  . Sexually Abused: Not on file    Medications:   Current Outpatient Medications on File Prior to Visit  Medication Sig Dispense Refill  . acetaminophen (TYLENOL 8 HOUR ARTHRITIS PAIN) 650 MG CR tablet Take 1,300 mg by mouth as needed for pain (pain).     Marland Kitchen  ALPRAZolam (XANAX) 0.5 MG tablet Take 0.5 mg by mouth as needed for anxiety.   1  . atorvastatin (LIPITOR) 40 MG tablet Take 1 tablet (40 mg total) by mouth daily. 90 tablet 3  . clindamycin (CLEOCIN) 300 MG capsule clindamycin HCl 300 mg capsule  TAKE 2 CAPSULES BY MOUTH 1 HOUR PRIOR TO DENTAL TREATMENT    . diltiazem (CARDIZEM) 30 MG tablet Take 1 tablet (30 mg total) by mouth 4 (four) times daily as needed. 60 tablet 6  . docusate sodium (COLACE) 100 MG capsule Take 100 mg by mouth 2 (two) times daily.    Marland Kitchen dofetilide (TIKOSYN) 500 MCG capsule Take 1 capsule (500 mcg total) by mouth 2 (two) times daily. 60 capsule 0  . fenofibrate 160 MG tablet TAKE 1 TABLET BY MOUTH EVERY DAY 90 tablet 3  . finasteride (PROSCAR) 5 MG tablet Take 5 mg by mouth daily.     . fluticasone (FLONASE) 50 MCG/ACT nasal spray Place 2 sprays into the nose daily.     Marland Kitchen levocetirizine (XYZAL) 5 MG tablet Take 5 mg by mouth every evening.    Marland Kitchen lisinopril (ZESTRIL) 10 MG tablet Take 10 mg by mouth daily.     . magnesium oxide (MAG-OX) 400 MG tablet Take 1 tablet (400 mg total) by mouth daily. 90 tablet 3  . metoprolol succinate (TOPROL-XL) 50 MG 24 hr tablet Take 1 tablet (50 mg total) by mouth daily. Take with or immediately following a meal. 90 tablet 3  . Multiple Vitamins-Minerals (MULTIVITAMINS THER. W/MINERALS) TABS Take 1 tablet by mouth daily.      . nitroGLYCERIN (NITROSTAT) 0.4 MG SL tablet Place 1 tablet (0.4 mg total) under the tongue every 5 (five) minutes as needed for chest pain. 25 tablet 5  . omeprazole (PRILOSEC) 10 MG capsule Take 1 capsule (10 mg total) by mouth daily. 90 capsule 3  . potassium chloride (K-DUR) 10 MEQ tablet Take 1 tablet (10 mEq total) by mouth daily. 90 tablet 3  . saccharomyces boulardii (  FLORASTOR) 250 MG capsule Take 250 mg by mouth daily. [facility] Brand    . sildenafil (REVATIO) 20 MG tablet Take 2 to 5 tabs daily as needed for erectile dysfunction (Patient taking differently: as needed.  ) 30 tablet 2  . Tamsulosin HCl (FLOMAX) 0.4 MG CAPS Take 0.4 mg by mouth daily.      Alveda Reasons 20 MG TABS tablet TAKE 1 TABLET BY MOUTH EVERY DAY 90 tablet 3   No current facility-administered medications on file prior to visit.    Allergies:   Allergies  Allergen Reactions  . Amoxicillin Swelling  . Fosinopril Other (See Comments)  . Ampicillin Rash  . Codeine Nausea Only and Other (See Comments)    Heavy amounts cause nausea  . Monopril [Fosinopril Sodium] Other (See Comments)    Muscle aches & pain  . Dilaudid [Hydromorphone Hcl] Nausea Only  . Percocet [Oxycodone-Acetaminophen] Nausea And Vomiting    Physical Exam  Today's Vitals   10/10/19 0830  BP: 135/73  Pulse: 64  Temp: 97.7 F (36.5 C)  TempSrc: Oral  Weight: 275 lb 12.8 oz (125.1 kg)  Height: 6\' 1"  (1.854 m)   Body mass index is 36.39 kg/m.    General: well developed, well nourished, pleasant elderly Caucasian male, seated, in no evident distress Head: head normocephalic and atraumatic.   Neck: supple with no carotid or supraclavicular bruits Cardiovascular: regular rate and rhythm, no murmurs Musculoskeletal: no deformity Skin:  no rash/petichiae Vascular:  Normal pulses all extremities  Neurologic Exam Mental Status: Awake and fully alert.  Normal speech and language.  Oriented to place and time. Recent and remote memory intact. Attention span, concentration and fund of knowledge appropriate. Mood and affect appropriate.  Cranial Nerves: Pupils equal, briskly reactive to light. Extraocular movements full without nystagmus. Visual fields full to confrontation. Hearing intact. Facial sensation intact. Face, tongue, palate moves normally and symmetrically.  Motor: Normal bulk and tone. Normal strength in all tested extremity muscles.  Diminished fine finger movements on the right.  Orbits left over right upper extremity.  Mild to moderate action tremor in the left upper extremity and mild right upper  extremity which worsens with activity.  Unable to appreciate tremors at rest.  Mild cogwheel rigidity at the left wrist.  Glabellar tap is negative.  No decreased facial expression or bradykinesia. Sensory.: intact to touch , pinprick , position and vibratory sensation.  Coordination: Rapid alternating movements normal in all extremities except mildly decreased right hand. Finger-to-nose and heel-to-shin performed accurately bilaterally. Gait and Station: Arises from chair without difficulty. Stance is normal. Gait demonstrates normal stride length.  Difficulty performing heel, toe and tandem walk.  No gait festination or stooped posture.  Normal arm swing but does have mild difficulty with turns Reflexes: 1+ RUE and RLE, diminished LUE and LLE. Toes downgoing.      ASSESSMENT: 74 year old Caucasian male with transient episode of presyncopal symptoms headache possible TIA in August 2020.  Patient had somewhat similar initial symptoms in October 2013 followed by speech difficulty and right-sided weakness and left MCA branch infarct from atrial fibrillation.  Vascular risk factors of obesity, hypertension, hyperlipidemia, atrial fibrillation and cardiac disease.  He also has 3-year history of left>right upper extremity action tremor which appears to be more likely essential tremor versus Parkinson's or parkinsonian syndrome.  He continues to be worried about Parkinson's disease due to his twin brother being recently diagnosed and is frustrated that additional testing has not been performed to rule out  Parkinson's.      PLAN: -Recommend obtaining MRI wo contrast to rule out possible cerebellar tremor with evidence of action tremor and imbalance.  MRI has not been obtained since 2013 prior to tremor onset -Recommend increasing Topamax to 50 mg a.m. and 100 mg p.m. due to likely essential tremor.  He has been unable to tolerate primidone and propranolol in the past -Discussion regarding low likelihood of  Parkinson's as he lacks majority of clinical features and tremors of Parkinson's disease are typically rest tremors and not action tremors.  I did advise him that I will discuss this further with one of the office providers and he will be notified if any additional testing is needed or with any additional recommendations -continue Xarelto for stroke prevention given history of atrial fibrillation and remote stroke in 2013  -maintain strict control of hypertension with blood pressure goal below 130/90, lipids with LDL cholesterol goal below 70 mg percent and diabetes with hemoglobin A1c goal below 6.5%.  -Adequate compliance with CPAP every night for OSA management and to eat a healthy diet and to exercise  Follow-up in 3 months or call earlier if needed  Greater than 50% time during this 25-minute visit was spent on counseling and coordination of care about his ongoing tremor likely essential tremor and less likely Parkinson's which was discussed, discussion regarding indication for MRI and increasing Topamax, discussion regarding history of episode of possible TIA and discussion about stroke prevention and answered all questions to patient satisfaction  Frann Rider, AGNP-BC  [facility] [address] [place], [place] [address]  Phone [phone] Fax [phone] Note: This document was prepared with digital dictation and possible smart phrase technology. Any transcriptional errors that result from this process are unintentional.

## 2019-10-10 NOTE — Addendum Note (Signed)
Addended by: Mal Misty on: 10/10/2019 10:42 AM   Modules accepted: Orders

## 2019-10-15 ENCOUNTER — Other Ambulatory Visit: Payer: Self-pay

## 2019-10-15 ENCOUNTER — Ambulatory Visit (HOSPITAL_COMMUNITY): Payer: Medicare PPO | Attending: Cardiology

## 2019-10-15 DIAGNOSIS — R0789 Other chest pain: Secondary | ICD-10-CM | POA: Diagnosis present

## 2019-10-15 DIAGNOSIS — I480 Paroxysmal atrial fibrillation: Secondary | ICD-10-CM | POA: Diagnosis present

## 2019-10-15 DIAGNOSIS — I2510 Atherosclerotic heart disease of native coronary artery without angina pectoris: Secondary | ICD-10-CM | POA: Insufficient documentation

## 2019-10-15 MED ORDER — REGADENOSON 0.4 MG/5ML IV SOLN
0.4000 mg | Freq: Once | INTRAVENOUS | Status: AC
  Administered 2019-10-15: 0.4 mg via INTRAVENOUS

## 2019-10-15 MED ORDER — TECHNETIUM TC 99M TETROFOSMIN IV KIT
32.9000 | PACK | Freq: Once | INTRAVENOUS | Status: AC | PRN
  Administered 2019-10-15: 32.9 via INTRAVENOUS
  Filled 2019-10-15: qty 33

## 2019-10-15 NOTE — Progress Notes (Signed)
I agree with the above plan 

## 2019-10-16 ENCOUNTER — Ambulatory Visit (HOSPITAL_COMMUNITY): Payer: Medicare Other

## 2019-10-17 ENCOUNTER — Other Ambulatory Visit (HOSPITAL_COMMUNITY): Payer: Self-pay | Admitting: Student

## 2019-10-17 ENCOUNTER — Other Ambulatory Visit: Payer: Self-pay

## 2019-10-17 ENCOUNTER — Ambulatory Visit (HOSPITAL_COMMUNITY): Payer: Medicare PPO | Attending: Cardiovascular Disease

## 2019-10-17 LAB — MYOCARDIAL PERFUSION IMAGING
LV dias vol: 109 mL (ref 62–150)
LV sys vol: 42 mL
Peak HR: 80 {beats}/min
Rest HR: 61 {beats}/min
SDS: 0
SRS: 0
SSS: 0
TID: 1.08

## 2019-10-17 MED ORDER — TECHNETIUM TC 99M TETROFOSMIN IV KIT
31.0000 | PACK | Freq: Once | INTRAVENOUS | Status: AC | PRN
  Administered 2019-10-17: 31 via INTRAVENOUS
  Filled 2019-10-17: qty 31

## 2019-10-19 ENCOUNTER — Other Ambulatory Visit: Payer: Self-pay

## 2019-10-19 MED ORDER — METOPROLOL SUCCINATE ER 50 MG PO TB24: 50.0000 mg | ORAL_TABLET | Freq: Every day | ORAL | 3 refills | Status: DC

## 2019-10-19 MED ORDER — OMEPRAZOLE 10 MG PO CPDR: 10.0000 mg | DELAYED_RELEASE_CAPSULE | Freq: Every day | ORAL | 3 refills | Status: DC

## 2019-10-19 MED ORDER — LISINOPRIL 10 MG PO TABS: 10.0000 mg | ORAL_TABLET | Freq: Every day | ORAL | 3 refills | Status: DC

## 2019-10-19 MED ORDER — NITROGLYCERIN 0.4 MG SL SUBL: 0.4000 mg | SUBLINGUAL_TABLET | SUBLINGUAL | 5 refills | Status: DC | PRN

## 2019-10-29 ENCOUNTER — Telehealth: Payer: Medicare Other | Admitting: Internal Medicine

## 2019-10-30 ENCOUNTER — Telehealth: Payer: Self-pay

## 2019-11-01 ENCOUNTER — Telehealth (INDEPENDENT_AMBULATORY_CARE_PROVIDER_SITE_OTHER): Payer: Medicare PPO | Admitting: Internal Medicine

## 2019-11-01 ENCOUNTER — Other Ambulatory Visit: Payer: Self-pay | Admitting: Cardiovascular Disease

## 2019-11-01 ENCOUNTER — Encounter: Payer: Self-pay | Admitting: Internal Medicine

## 2019-11-01 VITALS — BP 138/68 | HR 69 | Ht 73.0 in | Wt 268.7 lb

## 2019-11-01 DIAGNOSIS — G4733 Obstructive sleep apnea (adult) (pediatric): Secondary | ICD-10-CM | POA: Diagnosis not present

## 2019-11-01 DIAGNOSIS — I480 Paroxysmal atrial fibrillation: Secondary | ICD-10-CM

## 2019-11-01 DIAGNOSIS — I10 Essential (primary) hypertension: Secondary | ICD-10-CM

## 2019-11-01 DIAGNOSIS — D6869 Other thrombophilia: Secondary | ICD-10-CM | POA: Diagnosis not present

## 2019-11-01 NOTE — Progress Notes (Signed)
Electrophysiology TeleHealth Note   Due to national recommendations of social distancing due to COVID 19, an audio/video telehealth visit is felt to be most appropriate for this patient at this time.  See MyChart message from today for the patient's consent to telehealth for [facility].   Date:  11/01/2019   ID:  Corey [name], DOB June 17, 1946, MRN [id number]  Location: patient's home  Provider location:  [facility]  Evaluation Performed: Follow-up visit  PCP:  [name], MD   Electrophysiologist:  Dr Rayann Heman  Chief Complaint:  palpitations  History of Present Illness:    Corey [name] is a 74 y.o. male who presents via telehealth conferencing today.  Since starting tikosyn, the patient reports doing very well.  He remains in sinus rhythm.  Today, he denies symptoms of palpitations, chest pain, shortness of breath,  lower extremity edema, dizziness, presyncope, or syncope. He has torn a tendon in his R ankle.  This is his primary concern today.  The patient is otherwise without complaint today.  The patient denies symptoms of fevers, chills, cough, or new SOB worrisome for COVID 19.  Past Medical History:  Diagnosis Date  . Arthritis    "back, right knee, hands, ankles, neck" (05/31/2016)  . Atrial fibrillation with RVR ([place])   . Carotid artery disease ([place]) 08/30/2016   Carotid US 11/19: R 1-39, L 40-59 // Carotid US 08/2019: R 1-39; L 40-59; repeat 1 year  . Chronic lower back pain   . Coronary atherosclerosis of native coronary artery    a. BMS to [facility] 2004 and 2007, otherwise mild nonobstructive disease. EF normal.  . Diverticulitis   . Dyslipidemia   . Essential hypertension, benign   . GERD (gastroesophageal reflux disease)   . Lumbar radiculopathy, chronic 02/04/2015   Right L5  . Obesity   . OSA on CPAP   . Paroxysmal atrial fibrillation ([place])    a. Discovered after stroke.  . Pneumonia 01/1996  . PONV (postoperative nausea and vomiting)   . Recurrent upper  respiratory infection (URI)   . Visit for monitoring Tikosyn therapy 09/18/2019    Past Surgical History:  Procedure Laterality Date  . ANTERIOR CERVICAL DECOMP/DISCECTOMY FUSION  07/2001; 10/2002   "C5-6; C6-7; redo"  . BACK SURGERY    . CARPAL TUNNEL RELEASE Left 10/2015  . COLONOSCOPY W/ POLYPECTOMY  02/2014  . CORONARY ANGIOPLASTY WITH STENT PLACEMENT  05/2003; 12/2005   "mid RCA; mid RCA"  . HAND SURGERY  02/2019   LEFT HAND  . JOINT REPLACEMENT    . KNEE ARTHROSCOPY Left 10/2005  . KNEE ARTHROSCOPY W/ PARTIAL MEDIAL MENISCECTOMY Left 09/2005  . LUMBAR LAMINECTOMY/DECOMPRESSION MICRODISCECTOMY  03/2005   "L4-5"  . POSTERIOR LUMBAR FUSION  10/2003   L5-S1; "plates, screws"  . SHOULDER ARTHROSCOPY Right 08/2011   Debridement of labrum, arthroscopic distal clavicle excision  . SHOULDER OPEN ROTATOR CUFF REPAIR Left 07/2014  . TEE WITHOUT CARDIOVERSION  07/07/2012   Procedure: TRANSESOPHAGEAL ECHOCARDIOGRAM (TEE);  Surgeon: Fay Records, MD;  Location: [place];  Service: Cardiovascular;  Laterality: N/A;  . TONSILLECTOMY AND ADENOIDECTOMY  ~ 1956  . TOTAL KNEE ARTHROPLASTY Left 10/2006  . TRIGGER FINGER RELEASE Left 10/2015    Current Outpatient Medications  Medication Sig Dispense Refill  . acetaminophen (TYLENOL 8 HOUR ARTHRITIS PAIN) 650 MG CR tablet Take 1,300 mg by mouth as needed for pain (pain).     Marland Kitchen ALPRAZolam (XANAX) 0.5 MG tablet Take 0.5 mg  by mouth as needed for anxiety.   1  . atorvastatin (LIPITOR) 40 MG tablet Take 1 tablet (40 mg total) by mouth daily. 90 tablet 3  . clindamycin (CLEOCIN) 300 MG capsule clindamycin HCl 300 mg capsule  TAKE 2 CAPSULES BY MOUTH 1 HOUR PRIOR TO DENTAL TREATMENT    . diltiazem (CARDIZEM) 30 MG tablet Take 1 tablet (30 mg total) by mouth 4 (four) times daily as needed. 60 tablet 6  . docusate sodium (COLACE) 100 MG capsule Take 100 mg by mouth 2 (two) times daily.    Marland Kitchen dofetilide (TIKOSYN) 500 MCG capsule TAKE 1 CAPSULE BY MOUTH 2  TIMES DAILY. 108 capsule 3  . fenofibrate 160 MG tablet TAKE 1 TABLET BY MOUTH EVERY DAY 90 tablet 3  . finasteride (PROSCAR) 5 MG tablet Take 5 mg by mouth daily.     . fluticasone (FLONASE) 50 MCG/ACT nasal spray Place 2 sprays into the nose daily.     Marland Kitchen levocetirizine (XYZAL) 5 MG tablet Take 5 mg by mouth every evening.    Marland Kitchen lisinopril (ZESTRIL) 10 MG tablet Take 1 tablet (10 mg total) by mouth daily. 90 tablet 3  . magnesium oxide (MAG-OX) 400 MG tablet Take 1 tablet (400 mg total) by mouth daily. 90 tablet 3  . metoprolol succinate (TOPROL-XL) 50 MG 24 hr tablet Take 1 tablet (50 mg total) by mouth daily. Take with or immediately following a meal. 90 tablet 3  . Multiple Vitamins-Minerals (MULTIVITAMINS THER. W/MINERALS) TABS Take 1 tablet by mouth daily.      . nitroGLYCERIN (NITROSTAT) 0.4 MG SL tablet Place 1 tablet (0.4 mg total) under the tongue every 5 (five) minutes as needed for chest pain. 25 tablet 5  . omeprazole (PRILOSEC) 10 MG capsule Take 1 capsule (10 mg total) by mouth daily. 90 capsule 3  . potassium chloride (K-DUR) 10 MEQ tablet Take 1 tablet (10 mEq total) by mouth daily. 90 tablet 3  . saccharomyces boulardii (FLORASTOR) 250 MG capsule Take 250 mg by mouth daily. [facility] Brand    . sildenafil (REVATIO) 20 MG tablet Take 2 to 5 tabs daily as needed for erectile dysfunction 30 tablet 2  . Tamsulosin HCl (FLOMAX) 0.4 MG CAPS Take 0.4 mg by mouth daily.      Marland Kitchen topiramate (TOPAMAX) 50 MG tablet Take 1 tablet (50 mg total) by mouth daily AND 2 tablets (100 mg total) at bedtime. 90 tablet 3  . XARELTO 20 MG TABS tablet TAKE 1 TABLET BY MOUTH EVERY DAY 90 tablet 3   No current facility-administered medications for this visit.    Allergies:   Amoxicillin, Fosinopril, Ampicillin, Codeine, Monopril [fosinopril sodium], Dilaudid [hydromorphone hcl], and Percocet [oxycodone-acetaminophen]   Social History:  The patient  reports that he quit smoking about 16 years ago. His smoking  use included cigarettes. He has a 34.00 pack-year smoking history. He has never used smokeless tobacco. He reports that he does not drink alcohol or use drugs.   Family History:  The patient's family history includes Heart attack in his father; Heart disease in his father; Hypertension in his mother.   ROS:  Please see the history of present illness.   All other systems are personally reviewed and negative.    Exam:    Vital Signs:  BP 138/68   Pulse 69   Ht 6\' 1"  (1.854 m)   Wt 268 lb 11.2 oz (121.9 kg)   BMI 35.45 kg/m   Well sounding and appearing,  alert and conversant, regular work of breathing,  good skin color Eyes- anicteric, neuro- grossly intact, skin- no apparent rash or lesions or cyanosis, mouth- oral mucosa is pink  Labs/Other Tests and Data Reviewed:    Recent Labs: 07/10/2019: TSH 2.310 09/07/2019: Hemoglobin 14.8; Platelets 265 10/01/2019: BUN 21; Creatinine, Ser 1.17; Magnesium 1.8; Potassium 4.3; Sodium 139   Wt Readings from Last 3 Encounters:  11/01/19 268 lb 11.2 oz (121.9 kg)  10/15/19 275 lb (124.7 kg)  10/10/19 275 lb 12.8 oz (125.1 kg)     ekg 10/01/2019- sinus, stable qt  ASSESSMENT & PLAN:    1.  Paroxysmal atrial fibrillation Doing well with tikosyn He is on xarelto for chads2vasc score of 5. Will need bmet, mg and ekg in 3 months  2. CAD No ischemic symptoms No changes  3. OSA Compliance with therapy is advised  4. Overweight Body mass index is 35.45 kg/m. Lifestyle modification encouraged  5. HTN Stable No change required today  6. Prior stroke He is on xarelto May need bridging for his tendon surgery unless we can get by with holding xarelto for 24 hours.   Follow-up:  Every 3 months in the AF clinic Follow-up with Dr Acie Fredrickson as scheduled I will see when needed  Patient Risk:  after full review of this patients clinical status, I feel that they are at moderate risk at this time.  Today, I have spent 15 minutes with the  patient with telehealth technology discussing arrhythmia management .    Army Fossa, MD  11/01/2019 12:02 PM     [place] [address] [phone] (office) [phone] (fax)

## 2019-11-26 ENCOUNTER — Telehealth: Payer: Self-pay | Admitting: Adult Health

## 2019-11-26 NOTE — Telephone Encounter (Signed)
I spoke with Larene Beach at [facility] she stated they have tried to reach out to the patient 3 times and they also mailed him a letter.

## 2019-11-26 NOTE — Telephone Encounter (Signed)
Patient called back in regards to MRI states he has not received a call and was advised to contact [facility]

## 2019-12-03 MED ORDER — ALPRAZOLAM 0.5 MG PO TABS: ORAL_TABLET | ORAL | 0 refills | Status: DC

## 2019-12-03 NOTE — Telephone Encounter (Signed)
Pt called stating he has his MRI this Friday 3/5 and would like to know how much and when to take his medication (xanax). Please advise

## 2019-12-03 NOTE — Telephone Encounter (Signed)
Order placed for Xanax 0.5 mg 30 minutes prior to MRI and may repeat x1 if needed.  Unable to send electronically for unknown reasons.  Please advise patient that he will need to stop into the office to pick up prescription.

## 2019-12-04 NOTE — Telephone Encounter (Signed)
I called pt and he will take his own xanax 0.5mg  tabs, I relayed to take 17minutes prior to test and then another prn.  He verbalized understanding. His wife to e driver. No need for our prescription.

## 2019-12-13 ENCOUNTER — Other Ambulatory Visit: Payer: Self-pay | Admitting: Adult Health

## 2019-12-13 ENCOUNTER — Telehealth: Payer: Self-pay

## 2019-12-13 NOTE — Telephone Encounter (Addendum)
   [facility] Pre-operative Risk Assessment    Request for surgical clearance:  1. What type of surgery is being performed? PROSTATE BIOPSY   2. When is this surgery scheduled? TBD   3. What type of clearance is required (medical clearance vs. Pharmacy clearance to hold med vs. Both)? PHARMACY  4. Are there any medications that need to be held prior to surgery and how long? XARELTO X 3 DAYS   5. Practice name and name of physician performing surgery? [facility]; DR ESKRIDGE   6. What is your office phone number [phone]    7.   What is your office fax number [phone]  9.   Anesthesia type (None, local, MAC, general) ? LOCAL   Corey [name] 12/13/2019, 5:17 PM  _________________________________________________________________   (provider comments below)

## 2019-12-14 NOTE — Telephone Encounter (Addendum)
Patient with diagnosis of atrial fibrillation on Xarelto for anticoagulation.    Procedure: Prostate Biopsy Date of procedure: TBD  CHADS2-VASc score of 5 (HTN, AGE, stroke/tia x 2, CAD)  CrCl >60 Platelet count 265  Provider requesting 3 day hold due to high risk bleed procedure however patient is at high risk of clot due to history of stroke. Will defer to Dr. Acie Fredrickson.  Reviewed by Ramond Dial, Pharm.D, BCPS, CPP [place]  [address], [place], [place] [address]  Phone: [phone]; Fax: [phone]

## 2019-12-14 NOTE — Telephone Encounter (Addendum)
John may hold Xarelto for 2 days prior to prostate bx.  He has normal renal function and 2 days is enough time to allow the Xarelto to be out of his system .     Mertie Moores, MD  12/14/2019 5:29 PM    [facility] [place],  [place],   [address] Phone: [phone]; Fax: [phone]

## 2019-12-15 ENCOUNTER — Encounter: Payer: Self-pay | Admitting: Adult Health

## 2019-12-17 NOTE — Telephone Encounter (Addendum)
   I will route this recommendation to the requesting party via Epic fax function and remove from pre-op pool.  Please call with questions.  [place], [place] 12/17/2019, 9:54 AM

## 2019-12-18 NOTE — Telephone Encounter (Signed)
Please advise patient that for some reason the results were not sent to [place] through [facility] and that is the reason for the delay.  Review of his recent MRI did not show any new or acute abnormality that could be causing symptoms.  I will discuss case further with Dr. Leonie Man once he returns to office.  Thank you.

## 2019-12-19 ENCOUNTER — Telehealth: Payer: Self-pay | Admitting: Adult Health

## 2019-12-19 NOTE — Telephone Encounter (Signed)
Pt called wanting to know the update on his MRI he had. Please advise.

## 2019-12-19 NOTE — Telephone Encounter (Signed)
I spoke to pt this am and gave him results.

## 2019-12-19 NOTE — Telephone Encounter (Signed)
I called pt and relayed results per JM/NP note, nothing new or acute findings to account for his symptoms.  Will discuss with Dr. Leonie Man on his return to office.  Pt verbalized understanding.

## 2020-01-16 ENCOUNTER — Ambulatory Visit: Payer: Medicare PPO | Admitting: Adult Health

## 2020-01-16 ENCOUNTER — Other Ambulatory Visit: Payer: Self-pay

## 2020-01-16 ENCOUNTER — Encounter: Payer: Self-pay | Admitting: Adult Health

## 2020-01-16 VITALS — BP 138/72 | HR 64 | Temp 97.2°F | Ht 73.5 in | Wt 268.0 lb

## 2020-01-16 DIAGNOSIS — G4733 Obstructive sleep apnea (adult) (pediatric): Secondary | ICD-10-CM

## 2020-01-16 DIAGNOSIS — Z8673 Personal history of transient ischemic attack (TIA), and cerebral infarction without residual deficits: Secondary | ICD-10-CM | POA: Diagnosis not present

## 2020-01-16 DIAGNOSIS — G250 Essential tremor: Secondary | ICD-10-CM | POA: Diagnosis not present

## 2020-01-16 DIAGNOSIS — Z9989 Dependence on other enabling machines and devices: Secondary | ICD-10-CM

## 2020-01-16 DIAGNOSIS — R569 Unspecified convulsions: Secondary | ICD-10-CM | POA: Diagnosis not present

## 2020-01-16 DIAGNOSIS — R42 Dizziness and giddiness: Secondary | ICD-10-CM

## 2020-01-16 MED ORDER — TOPIRAMATE 50 MG PO TABS: 100.0000 mg | ORAL_TABLET | Freq: Two times a day (BID) | ORAL | 3 refills | Status: DC

## 2020-01-16 NOTE — Progress Notes (Signed)
[name] [address] [place]. [place] [address] [phone]       OFFICE FOLLOW UP NOTE  [name] Date of Birth:  March 08, 1946 Medical Record Number:  [id number]   Referring MD: Chriss Czar provider: Dr. Leonie Man  Reason for Referral: TIA and essential tremor   Chief complaint: Chief Complaint  Patient presents with  . Follow-up    rm 9, with wife, tremor hx of stroke, pt states no improvements     HPI:   Corey [name] is a 74 year old male who is being seen today, 01/16/2020, for 3 months follow-up in regards to prior strokelike symptoms possibly TIA and essential tremors.  He comes in today with multiple concerns.  He reports on April 5, he had another episode similar to what occurred in August but increased severity.  He reports driving and had sudden onset of feeling as though he was going to lose consciousness, feeling like his eyes wanted to continue to go back inside, blurred vision, difficulty staying awake and follows all hands were frozen on the steering wheel.  He was able to eventually exit highway and symptoms gradually started improving.  He believes episode lasted approximately 2 minutes but possibly longer.  He did feel fogginess immediately after for a couple minutes but that resolved.  He believes possibly mild headache but unable to say for sure.  He denies any dizziness sensation, lightheadedness, heart palpitations, shortness of breath or severe headache.  He also reports having episode in February but fortunately was not driving, symptoms were less severe and was able to grab onto the side of his truck for support and symptoms shortly resolved.  He was unable to monitor blood pressure at that time but routinely monitors at home and has been stable as well as stable today at 138/72.  He is concerned regarding possible seizure activity.  Essential tremors: He reports initial improvement after increasing Topamax dosage from 50 mg twice  daily to 50mg  AM and 100 PM.  but has been slowly worsening and interferes with daily activity.  He will also have difficulty at times with ambulation and stumbling.  He has tolerated dosage well without difficulty.  Obtain MRI to rule out underlying etiology causing tremors which was unremarkable for acute abnormality or concerning causes.  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  OSA on CPAP: He reports ongoing nightly compliance with CPAP but has not had a sleep study in "numerous years" and is overdue for a new machine.  Previously followed by [facility] but has had difficulty getting in contact with them for follow-up visit.  He reports fatigue which has been slowly worsening and insomnia.  History of stroke: History of left parietal stroke.  Continues on Xarelto for history of atrial fibrillation and secondary stroke prevention without bleeding or bruising.  Continues on atorvastatin without myalgias.  Blood pressure stable.  Denies known worsening stroke/TIA symptoms.  Vertigo: Reports increased vertigo/dizziness with rapid head movements and lasts only a few seconds and then resolves.  At times episodes can last longer in duration or be more severe.  He does report family history of vertigo.     History provided for reference purposes only Update 10/10/2019 JM: Corey [name] is a 74 year old male who is being seen today for 71-month follow-up.  He has been stable from a  stroke standpoint without any reoccurring or new stroke/TIA symptoms.  He was initiated on Topamax at prior visit due to likely essential tremor.  He states he has seen some improvement with use of Topamax 50 mg twice daily but he will have episodes where his tremors will be worse with increased fatigue and energy towards the  afternoon.  He also endorses worsening with stressful events.  He has also been having difficulty with balance where he will stumble at times, kick the edge of the step when going upstairs and has difficulty making turns while ambulating.  This occurs more with his right foot.  He is overall very frustrated as he is concerned regarding possibly having Parkinson's disease as his brother has been recently diagnosed and he wants to ensure that he does not have Parkinson's.  He is frustrated as he has not underwent any type of imaging or testing to rule this out.  He continues on Xarelto for secondary stroke prevention history of atrial fibrillation without bleeding or bruising.  Continues on atorvastatin without myalgias.  Blood pressure today 135/73.  He continues to use CPAP for OSA management.  He continues to follow with cardiology regularly.  No concerns at this time.  Initial visit 06/25/2019 Dr. 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  History is obtained from the patient, review of electronic medical records and I personally reviewed imaging films in PACS.  Patient states that he was driving in [facility] on 06/02/2019 when all of a sudden he did not feel well.  He felt that he may pass out vision was getting blurred he was having a mild headache.  He managed to pull over the car and ask wife to take over.  He had a portable EKG monitor which showed that his pulse rate was 73 and rhythm was okay.  But his blood pressure was elevated at 195 95.  He waited about 5 minutes and retook the blood pressure it was still high at 185/95.  They decided to go over to  the local ER where he had further work-up including checking blood sugar which was 128 mg percent.  Patient at that time was also feeling cold and clammy and sweaty.  He was evaluated at [facility] where CT scan of the head as well as basic lab work were obtained which were all normal.  Patient states that he had somewhat similar episode before he had his previous stroke in 2013.  He has atrial fibrillation and has been taking Xarelto quite faithfully with his dinner every night without fail.  He states he had last lipid profile and A1c checked in December 2019 by his primary physician and they were satisfactory and he is plans to see him next week to have them repeated.  He does use his CPAP every night regularly.  He is currently on a low-carb diet and wants to lose some weight.  He did have a recent tele-visit with his cardiologist who added hydrochlorothiazide for blood pressure control and his blood pressure is doing better now running 353-614 systolic.  The patient has had longstanding history of tremors in his left hand probably 3 years or longer.  He is was diagnosed  with essential tremor in fact was referred by me to Dr. Rexene Alberts on 11/07/2017 for second opinion who also felt it was essential tremor rather than Parkinson's.  Patient did not tolerate primidone well and it made him feel loopy and stopped it.  He also complained of a lot of fatigue and tiredness on Inderal which was discontinued.  He states that he is noticed increasing tremor as well as some drooling.  His brother was recently diagnosed with atypical Parkinson's and patient wonders if he has the same.     ROS:   14 system review of systems is positive for see HPI and all other systems negative  PMH:  Past Medical History:  Diagnosis Date  . Arthritis    "back, right knee, hands, ankles, neck" (05/31/2016)  . Atrial fibrillation with RVR ([place])   . Carotid artery disease ([employer]) 08/30/2016   Carotid US 11/19: R 1-39, L  40-59 // Carotid US 08/2019: R 1-39; L 40-59; repeat 1 year  . Chronic lower back pain   . Coronary atherosclerosis of native coronary artery    a. BMS to [facility] 2004 and 2007, otherwise mild nonobstructive disease. EF normal.  . Diverticulitis   . Dyslipidemia   . Essential hypertension, benign   . GERD (gastroesophageal reflux disease)   . Lumbar radiculopathy, chronic 02/04/2015   Right L5  . Obesity   . OSA on CPAP   . Paroxysmal atrial fibrillation ([place])    a. Discovered after stroke.  . Pneumonia 01/1996  . PONV (postoperative nausea and vomiting)   . Recurrent upper respiratory infection (URI)   . Visit for monitoring Tikosyn therapy 09/18/2019    Social History:  Social History   Socioeconomic History  . Marital status: Married    Spouse name: [name]  . Number of children: 3  . Years of education: 13  . Highest education level: Not on file  Occupational History  . Occupation: Retired    Fish farm manager: DISABLED     Comment: Works at [employer] part time  Tobacco Use  . Smoking status: Former Smoker    Packs/day: 1.00    Years: 34.00    Pack years: 34.00    Types: Cigarettes    Quit date: 05/05/2003    Years since quitting: 16.7  . Smokeless tobacco: Never Used  Substance and Sexual Activity  . Alcohol use: No    Alcohol/week: 0.0 standard drinks  . Drug use: No  . Sexual activity: Yes  Other Topics Concern  . Not on file  Social History Narrative   Patient lives at home with spouse.   Caffeine Use:    Social Determinants of Health   Financial Resource Strain:   . Difficulty of Paying Living Expenses:   Food Insecurity:   . Worried About Charity fundraiser in the Last Year:   . Arboriculturist in the Last Year:   Transportation Needs:   . Film/video editor (Medical):   Marland Kitchen Lack of Transportation (Non-Medical):   Physical Activity:   . Days of Exercise per Week:   . Minutes of Exercise per Session:   Stress:   . Feeling of Stress :   Social  Connections:   . Frequency of Communication with Friends and Family:   . Frequency of Social Gatherings with Friends and Family:   . Attends Religious Services:   . Active Member of Clubs or Organizations:   . Attends Archivist Meetings:   Marland Kitchen Marital Status:  Intimate Partner Violence:   . Fear of Current or Ex-Partner:   . Emotionally Abused:   Marland Kitchen Physically Abused:   . Sexually Abused:     Medications:   Current Outpatient Medications on File Prior to Visit  Medication Sig Dispense Refill  . acetaminophen (TYLENOL 8 HOUR ARTHRITIS PAIN) 650 MG CR tablet Take 1,300 mg by mouth as needed for pain (pain).     Marland Kitchen ALPRAZolam (XANAX) 0.5 MG tablet Take 0.5 mg by mouth as needed for anxiety.   1  . atorvastatin (LIPITOR) 40 MG tablet Take 1 tablet (40 mg total) by mouth daily. 90 tablet 3  . clindamycin (CLEOCIN) 300 MG capsule clindamycin HCl 300 mg capsule  TAKE 2 CAPSULES BY MOUTH 1 HOUR PRIOR TO DENTAL TREATMENT    . diltiazem (CARDIZEM) 30 MG tablet Take 1 tablet (30 mg total) by mouth 4 (four) times daily as needed. 60 tablet 6  . docusate sodium (COLACE) 100 MG capsule Take 100 mg by mouth 2 (two) times daily.    Marland Kitchen dofetilide (TIKOSYN) 500 MCG capsule TAKE 1 CAPSULE BY MOUTH 2 TIMES DAILY. 108 capsule 3  . fenofibrate 160 MG tablet TAKE 1 TABLET BY MOUTH EVERY DAY 90 tablet 3  . finasteride (PROSCAR) 5 MG tablet Take 5 mg by mouth daily.     . fluticasone (FLONASE) 50 MCG/ACT nasal spray Place 2 sprays into the nose daily.     Marland Kitchen levocetirizine (XYZAL) 5 MG tablet Take 5 mg by mouth every evening.    Marland Kitchen lisinopril (ZESTRIL) 10 MG tablet Take 1 tablet (10 mg total) by mouth daily. 90 tablet 3  . magnesium oxide (MAG-OX) 400 MG tablet Take 1 tablet (400 mg total) by mouth daily. 90 tablet 3  . metoprolol succinate (TOPROL-XL) 50 MG 24 hr tablet Take 1 tablet (50 mg total) by mouth daily. Take with or immediately following a meal. 90 tablet 3  . Multiple Vitamins-Minerals  (MULTIVITAMINS THER. W/MINERALS) TABS Take 1 tablet by mouth daily.      . nitroGLYCERIN (NITROSTAT) 0.4 MG SL tablet Place 1 tablet (0.4 mg total) under the tongue every 5 (five) minutes as needed for chest pain. 25 tablet 5  . omeprazole (PRILOSEC) 10 MG capsule Take 1 capsule (10 mg total) by mouth daily. 90 capsule 3  . potassium chloride (K-DUR) 10 MEQ tablet Take 1 tablet (10 mEq total) by mouth daily. 90 tablet 3  . saccharomyces boulardii (FLORASTOR) 250 MG capsule Take 250 mg by mouth daily. [employer] [facility]    . sildenafil (REVATIO) 20 MG tablet Take 2 to 5 tabs daily as needed for erectile dysfunction 30 tablet 2  . Tamsulosin HCl (FLOMAX) 0.4 MG CAPS Take 0.4 mg by mouth daily.      Marland Kitchen topiramate (TOPAMAX) 50 MG tablet Take 1 tablet (50 mg total) by mouth daily AND 2 tablets (100 mg total) at bedtime. 90 tablet 3  . XARELTO 20 MG TABS tablet TAKE 1 TABLET BY MOUTH EVERY DAY 90 tablet 3   No current facility-administered medications on file prior to visit.    Allergies:   Allergies  Allergen Reactions  . Amoxicillin Swelling  . Fosinopril Other (See Comments)  . Ampicillin Rash  . Codeine Nausea Only and Other (See Comments)    Heavy amounts cause nausea  . Monopril [Fosinopril Sodium] Other (See Comments)    Muscle aches & pain  . Dilaudid [Hydromorphone Hcl] Nausea Only  . Percocet [Oxycodone-Acetaminophen] Nausea And Vomiting  OBJECTIVE:   Vitals:  Today's Vitals   01/16/20 0850  BP: 138/72  Pulse: 64  Temp: (!) 97.2 F (36.2 C)  Weight: 268 lb (121.6 kg)  Height: 6' 1.5" (1.867 m)   Body mass index is 34.88 kg/m.   Physical exam:  General: well developed, well nourished, pleasant elderly Caucasian male, seated, in no evident distress Head: head normocephalic and atraumatic.   Neck: supple with no carotid or supraclavicular bruits Cardiovascular: regular rate and rhythm, no murmurs Musculoskeletal: no deformity Skin:  no rash/petichiae Vascular:  Normal  pulses all extremities  Neurologic Exam Mental Status: Awake and fully alert. Normal speech and language. Oriented to place and time. Recent and remote memory intact. Attention span, concentration and fund of knowledge appropriate. Mood and affect appropriate.  Cranial Nerves: Pupils equal, briskly reactive to light.  Esotropia right eye.  Extraocular movements full without nystagmus. Visual fields full to confrontation. Hearing intact. Facial sensation intact. Face, tongue, palate moves normally and symmetrically.  Motor: Normal bulk and tone. Normal strength in all tested extremity muscles.  Mild action tremor L>R.  No tremors observed at rest.  Mild cogwheel rigidity at the left wrist.  Glabellar tap is negative.  No decreased facial expression or bradykinesia. Sensory.: intact to touch , pinprick , position and vibratory sensation.  Coordination: Rapid alternating movements normal in all extremities.  Finger-to-nose and heel-to-shin performed accurately bilaterally. Gait and Station: Arises from chair without difficulty. Stance is normal. Gait demonstrates normal stride length.  Difficulty performing heel, toe and tandem walk.  No gait festination or stooped posture.  Normal arm swing but does have mild difficulty with turns Reflexes: 1+ RUE and RLE, diminished LUE and LLE. Toes downgoing.       ASSESSMENT: 74 year old Caucasian male with transient episode of presyncopal symptoms initially in August 2020 and with increased severity most recently 01/16/2020 (see HPI).  Patient had somewhat similar initial symptoms in October 2013 followed by speech difficulty and right-sided weakness and left MCA branch infarct from atrial fibrillation.  Vascular risk factors of obesity, hypertension, hyperlipidemia, atrial fibrillation, OSA on CPAP and cardiac disease.  He also has 3-year history of left>right upper extremity action tremor which appears to be more likely essential tremor versus Parkinson's or  parkinsonian syndrome.  He also has concerns regarding vertigo and question BPPV.    PLAN:  Recurrent episode:   -Recurrence with unknown etiology.  Possibly focal seizure, complicated migraine, cardiac  related, metabolic, psychogenic.  Low suspicion for TIA/stroke in setting of recurrent  episodes  -Obtain EEG to rule out possible seizure activity  -Prior presyncopal episodes in relation to atrial fibrillation -advised to follow-up with  cardiology to rule out cardiogenic etiologies. ?Benefit from long-term monitoring  Essential tremors:  -Recommend increasing Topamax 100 mg twice daily.  Advised patient to take morning  dosage first thing upon awakening and take a second dosage mid to later afternoon.  He will  call office with any difficulty tolerating or if not beneficial  TIA/stroke history:   -Continuation of Xarelto and atorvastatin with history of atrial fibrillation and secondary stroke  prevention.    -Continue to follow with PCP for HTN and HLD management.  -maintain strict control of hypertension with blood pressure goal below 130/90, lipids with  LDL cholesterol goal below 70 mg percent and diabetes with hemoglobin A1c goal below  6.5%.   -Adequate compliance with CPAP every night for OSA management and to eat a healthy diet  and to exercise  Vertigo:   -Symptoms consistent with BPPV and provided patient with additional information.  Discussion regarding possible benefit of vestibular rehab and he will call office in the future if  interested in pursuing.   OSA on CPAP:  -Referral placed to [facility] to establish care for sleep apnea as well as possible  need of repeating sleep study and obtaining a new machine   Follow-up in 2 months with Dr. Leonie Man for further evaluation and discussion regarding recurrent episodes or call earlier if needed   I spent 50 minutes of face-to-face and non-face-to-face time with patient and wife.  This included previsit chart review, lab  review, study review, order entry, electronic health record documentation, discussion regarding recurrent episodes of unknown etiology, prior history of stroke, vertigo, OSA on CPAP, likely essential tremors and answered all questions to patient and wife satisfaction    Frann Rider, AGNP-BC  [facility] [address] [place], [place] [address]  Phone [phone] Fax [phone] Note: This document was prepared with digital dictation and possible smart phrase technology. Any transcriptional errors that result from this process are unintentional.

## 2020-01-16 NOTE — Progress Notes (Signed)
I agree with the above plan 

## 2020-01-16 NOTE — Patient Instructions (Addendum)
Topamax dosage increase to 100mg  Am and 100 afternoon - please call office if you continue to have difficulty or unable to tolerate   Questionable seizure activity: will obtain EEG  Referral placed to [facility] to establish care with PCP  Please let me know if you are interested in pursing vestibular therapy for likely positional vertigo     Followup in the future with me in 2 months with Dr. Leonie Man or call earlier if needed       Thank you for coming to see [place] at [facility]. I hope we have been able to provide you high quality care today.  You may receive a patient satisfaction survey over the next few weeks. We would appreciate your feedback and comments so that we may continue to improve ourselves and the health of our patients.;an     Benign Positional Vertigo Vertigo is the feeling that you or your surroundings are moving when they are not. Benign positional vertigo is the most common form of vertigo. This is usually a harmless condition (benign). This condition is positional. This means that symptoms are triggered by certain movements and positions. This condition can be dangerous if it occurs while you are doing something that could cause harm to you or others. This includes activities such as driving or operating machinery. What are the causes? In many cases, the cause of this condition is not known. It may be caused by a disturbance in an area of the inner ear that helps your brain to sense movement and balance. This disturbance can be caused by:  Viral infection (labyrinthitis).  Head injury.  Repetitive motion, such as jumping, dancing, or running. What increases the risk? You are more likely to develop this condition if:  You are a woman.  You are 42 years of age or older. What are the signs or symptoms? Symptoms of this condition usually happen when you move your head or your eyes in different directions. Symptoms may start suddenly,  and usually last for less than a minute. They include:  Loss of balance and falling.  Feeling like you are spinning or moving.  Feeling like your surroundings are spinning or moving.  Nausea and vomiting.  Blurred vision.  Dizziness.  Involuntary eye movement (nystagmus). Symptoms can be mild and cause only minor problems, or they can be severe and interfere with daily life. Episodes of benign positional vertigo may return (recur) over time. Symptoms may improve over time. How is this diagnosed? This condition may be diagnosed based on:  Your medical history.  Physical exam of the head, neck, and ears.  Tests, such as: ? MRI. ? CT scan. ? Eye movement tests. Your health care provider may ask you to change positions quickly while he or she watches you for symptoms of benign positional vertigo, such as nystagmus. Eye movement may be tested with a variety of exams that are designed to evaluate or stimulate vertigo. ? An electroencephalogram (EEG). This records electrical activity in your brain. ? Hearing tests. You may be referred to a health care provider who specializes in ear, nose, and throat (ENT) problems (otolaryngologist) or a provider who specializes in disorders of the nervous system (neurologist). How is this treated?  This condition may be treated in a session in which your health care provider moves your head in specific positions to adjust your inner ear back to normal. Treatment for this condition may take several sessions. Surgery may be needed in severe cases, but  this is rare. In some cases, benign positional vertigo may resolve on its own in 2-4 weeks. Follow these instructions at home: Safety  Move slowly. Avoid sudden body or head movements or certain positions, as told by your health care provider.  Avoid driving until your health care provider says it is safe for you to do so.  Avoid operating heavy machinery until your health care provider says it is  safe for you to do so.  Avoid doing any tasks that would be dangerous to you or others if vertigo occurs.  If you have trouble walking or keeping your balance, try using a cane for stability. If you feel dizzy or unstable, sit down right away.  Return to your normal activities as told by your health care provider. Ask your health care provider what activities are safe for you. General instructions  Take over-the-counter and prescription medicines only as told by your health care provider.  Drink enough fluid to keep your urine pale yellow.  Keep all follow-up visits as told by your health care provider. This is important. Contact a health care provider if:  You have a fever.  Your condition gets worse or you develop new symptoms.  Your family or friends notice any behavioral changes.  You have nausea or vomiting that gets worse.  You have numbness or a "pins and needles" sensation. Get help right away if you:  Have difficulty speaking or moving.  Are always dizzy.  Faint.  Develop severe headaches.  Have weakness in your legs or arms.  Have changes in your hearing or vision.  Develop a stiff neck.  Develop sensitivity to light. Summary  Vertigo is the feeling that you or your surroundings are moving when they are not. Benign positional vertigo is the most common form of vertigo.  The cause of this condition is not known. It may be caused by a disturbance in an area of the inner ear that helps your brain to sense movement and balance.  Symptoms include loss of balance and falling, feeling that you or your surroundings are moving, nausea and vomiting, and blurred vision.  This condition can be diagnosed based on symptoms, physical exam, and other tests, such as MRI, CT scan, eye movement tests, and hearing tests.  Follow safety instructions as told by your health care provider. You will also be told when to contact your health care provider in case of problems. This  information is not intended to replace advice given to you by your health care provider. Make sure you discuss any questions you have with your health care provider. Document Revised: 03/01/2018 Document Reviewed: 03/01/2018 Elsevier Patient Education  [place].

## 2020-01-17 ENCOUNTER — Ambulatory Visit: Payer: Medicare PPO

## 2020-01-17 ENCOUNTER — Other Ambulatory Visit: Payer: Self-pay

## 2020-01-17 DIAGNOSIS — R569 Unspecified convulsions: Secondary | ICD-10-CM | POA: Diagnosis not present

## 2020-01-21 ENCOUNTER — Telehealth: Payer: Self-pay | Admitting: Adult Health

## 2020-01-21 NOTE — Telephone Encounter (Signed)
Call patient in regards to recent EEG which did not show any abnormalities or evidence of epileptiform features.  Discussion with Dr. Leonie Man in regards to higher suspicion of cardiac etiology as neurologic causes such as seizure or TIA less likely.  Message sent to Dr. Rayann Heman requesting further cardiac evaluation with history of atrial fibrillation and possible need of cardiac monitor as prior episode in 05/2019, 10/2019 (less severe) and recently on 01/07/2020 (more severe then first episode per patient).  Patient had no questions or concerns regarding recent EEG and was appreciative of phone call.

## 2020-01-28 NOTE — Progress Notes (Signed)
Dr. Rayann Heman, Sounds good.  Greatly appreciate your input and please let me know if you have any questions or concerns. Take care, Janett Billow

## 2020-01-29 NOTE — Progress Notes (Signed)
Primary Care Physician: [name], MD Primary Cardiologist: Dr Acie Fredrickson Primary Electrophysiologist: Dr Rayann Heman Referring Physician: Dr Deatra Canter is a 74 y.o. male with a history of CAD, HTN, OSA, CVA, paroxysmal atrial fibrillation, and HLD who presents for follow up in the [facility].  The patient was initially diagnosed with atrial fibrillation in 2016 after presenting with symptoms of presyncope. He converted on IV diltiazem and has been maintained on Xarelto. Patient had done well for years until 07/06/19 when he presented to the ER with symptoms of palpitations and chest pressure and found to be in afib vs atrial flutter with RVR. He converted while being evaluated in the ER and his symptoms resolved. Since then, he has not had any further symptoms. He denies any specific triggers that he could identify. He has a diagnosis of OSA and is compliant with his CPAP. He denies significant alcohol use. He is on Xarelto for a CHADS2VASC score of 5. Patient had another episode of afib on 09/07/19. There were no specific triggers that the patient could identify. He took the PRN diltiazem without much relief. He presented to the ER in afib with RVR and underwent successful DCCV. Patient is s/p dofetilide loading 12/15-12/18/20.   On follow up today, patient reports he has had recurrent episodes of blurred vision and "foggy headedness". Neurologic workup was unremarkable. He has an appointment with Dr Rayann Heman to discuss ILR. He denies any heart racing or palpitations. He does admit to fatigue. Of note, he had a recent prostate biopsy and will get the results this afternoon.   Today, he denies symptoms of palpitations, chest pain, shortness of breath, orthopnea, PND, lower extremity edema, dizziness, presyncope, syncope, snoring, daytime somnolence, bleeding, or neurologic sequela. The patient is tolerating medications without difficulties and is otherwise without  complaint today.    Atrial Fibrillation Risk Factors:  he does have symptoms or diagnosis of sleep apnea. he is compliant with CPAP therapy. he does not have a history of rheumatic fever. he does not have a history of alcohol use. The patient does not have a history of early familial atrial fibrillation or other arrhythmias.  he has a BMI of Body mass index is 35.19 kg/m.Marland Kitchen Filed Weights   01/30/20 0923  Weight: 122.7 kg    Family History  Problem Relation Age of Onset  . Hypertension Mother   . Heart disease Father   . Heart attack Father      Atrial Fibrillation Management history:  Previous antiarrhythmic drugs: dofetilide Previous cardioversions: 09/07/19 Previous ablations: none CHADS2VASC score: 5 Anticoagulation history: Xarelto    Past Medical History:  Diagnosis Date  . Arthritis    "back, right knee, hands, ankles, neck" (05/31/2016)  . Atrial fibrillation with RVR ([place])   . Carotid artery disease ([place]) 08/30/2016   Carotid US 11/19: R 1-39, L 40-59 // Carotid US 08/2019: R 1-39; L 40-59; repeat 1 year  . Chronic lower back pain   . Coronary atherosclerosis of native coronary artery    a. BMS to [facility] 2004 and 2007, otherwise mild nonobstructive disease. EF normal.  . Diverticulitis   . Dyslipidemia   . Essential hypertension, benign   . GERD (gastroesophageal reflux disease)   . Lumbar radiculopathy, chronic 02/04/2015   Right L5  . Obesity   . OSA on CPAP   . Paroxysmal atrial fibrillation ([place])    a. Discovered after stroke.  . Pneumonia 01/1996  . PONV (postoperative  nausea and vomiting)   . Recurrent upper respiratory infection (URI)   . Visit for monitoring Tikosyn therapy 09/18/2019   Past Surgical History:  Procedure Laterality Date  . ANTERIOR CERVICAL DECOMP/DISCECTOMY FUSION  07/2001; 10/2002   "C5-6; C6-7; redo"  . BACK SURGERY    . CARPAL TUNNEL RELEASE Left 10/2015  . COLONOSCOPY W/ POLYPECTOMY  02/2014  . CORONARY ANGIOPLASTY WITH  STENT PLACEMENT  05/2003; 12/2005   "mid RCA; mid RCA"  . HAND SURGERY  02/2019   LEFT HAND  . JOINT REPLACEMENT    . KNEE ARTHROSCOPY Left 10/2005  . KNEE ARTHROSCOPY W/ PARTIAL MEDIAL MENISCECTOMY Left 09/2005  . LUMBAR LAMINECTOMY/DECOMPRESSION MICRODISCECTOMY  03/2005   "L4-5"  . POSTERIOR LUMBAR FUSION  10/2003   L5-S1; "plates, screws"  . SHOULDER ARTHROSCOPY Right 08/2011   Debridement of labrum, arthroscopic distal clavicle excision  . SHOULDER OPEN ROTATOR CUFF REPAIR Left 07/2014  . TEE WITHOUT CARDIOVERSION  07/07/2012   Procedure: TRANSESOPHAGEAL ECHOCARDIOGRAM (TEE);  Surgeon: Fay Records, MD;  Location: [facility];  Service: Cardiovascular;  Laterality: N/A;  . TONSILLECTOMY AND ADENOIDECTOMY  ~ 1956  . TOTAL KNEE ARTHROPLASTY Left 10/2006  . TRIGGER FINGER RELEASE Left 10/2015    Current Outpatient Medications  Medication Sig Dispense Refill  . acetaminophen (TYLENOL 8 HOUR ARTHRITIS PAIN) 650 MG CR tablet Take 1,300 mg by mouth as needed for pain (pain).     Marland Kitchen ALPRAZolam (XANAX) 0.5 MG tablet Take 0.5 mg by mouth as needed for anxiety.   1  . atorvastatin (LIPITOR) 40 MG tablet Take 1 tablet (40 mg total) by mouth daily. 90 tablet 3  . clindamycin (CLEOCIN) 300 MG capsule clindamycin HCl 300 mg capsule  TAKE 2 CAPSULES BY MOUTH 1 HOUR PRIOR TO DENTAL TREATMENT    . diltiazem (CARDIZEM) 30 MG tablet Take 1 tablet (30 mg total) by mouth 4 (four) times daily as needed. 60 tablet 6  . docusate sodium (COLACE) 100 MG capsule Take 100 mg by mouth 2 (two) times daily.    Marland Kitchen dofetilide (TIKOSYN) 500 MCG capsule TAKE 1 CAPSULE BY MOUTH 2 TIMES DAILY. 108 capsule 3  . fenofibrate 160 MG tablet TAKE 1 TABLET BY MOUTH EVERY DAY 90 tablet 3  . finasteride (PROSCAR) 5 MG tablet Take 5 mg by mouth daily.     . fluticasone (FLONASE) 50 MCG/ACT nasal spray Place 2 sprays into the nose daily.     Marland Kitchen levocetirizine (XYZAL) 5 MG tablet Take 5 mg by mouth every evening.    Marland Kitchen lisinopril  (ZESTRIL) 10 MG tablet Take 1 tablet (10 mg total) by mouth daily. 90 tablet 3  . magnesium oxide (MAG-OX) 400 MG tablet Take 1 tablet (400 mg total) by mouth daily. 90 tablet 3  . metoprolol succinate (TOPROL-XL) 50 MG 24 hr tablet Take 1 tablet (50 mg total) by mouth daily. Take with or immediately following a meal. 90 tablet 3  . Multiple Vitamins-Minerals (MULTIVITAMINS THER. W/MINERALS) TABS Take 1 tablet by mouth daily.      . nitroGLYCERIN (NITROSTAT) 0.4 MG SL tablet Place 1 tablet (0.4 mg total) under the tongue every 5 (five) minutes as needed for chest pain. 25 tablet 5  . omeprazole (PRILOSEC) 10 MG capsule Take 1 capsule (10 mg total) by mouth daily. 90 capsule 3  . potassium chloride (K-DUR) 10 MEQ tablet Take 1 tablet (10 mEq total) by mouth daily. 90 tablet 3  . saccharomyces boulardii (FLORASTOR) 250 MG capsule Take  250 mg by mouth daily. [employer]    . sildenafil (REVATIO) 20 MG tablet Take 2 to 5 tabs daily as needed for erectile dysfunction 30 tablet 2  . Tamsulosin HCl (FLOMAX) 0.4 MG CAPS Take 0.4 mg by mouth daily.      Marland Kitchen topiramate (TOPAMAX) 50 MG tablet Take 2 tablets (100 mg total) by mouth 2 (two) times daily. 360 tablet 3  . XARELTO 20 MG TABS tablet TAKE 1 TABLET BY MOUTH EVERY DAY 90 tablet 3   No current facility-administered medications for this encounter.    Allergies  Allergen Reactions  . Amoxicillin Swelling  . Fosinopril Other (See Comments)  . Ampicillin Rash  . Codeine Nausea Only and Other (See Comments)    Heavy amounts cause nausea  . Monopril [Fosinopril Sodium] Other (See Comments)    Muscle aches & pain  . Dilaudid [Hydromorphone Hcl] Nausea Only  . Percocet [Oxycodone-Acetaminophen] Nausea And Vomiting    Social History   Socioeconomic History  . Marital status: Married    Spouse name: [name]  . Number of children: 3  . Years of education: 56  . Highest education level: Not on file  Occupational History  . Occupation: Retired     Fish farm manager: DISABLED     Comment: Works at [employer] part time  Tobacco Use  . Smoking status: Former Smoker    Packs/day: 1.00    Years: 34.00    Pack years: 34.00    Types: Cigarettes    Quit date: 05/05/2003    Years since quitting: 16.7  . Smokeless tobacco: Never Used  Substance and Sexual Activity  . Alcohol use: No    Alcohol/week: 0.0 standard drinks  . Drug use: No  . Sexual activity: Yes  Other Topics Concern  . Not on file  Social History Narrative   Patient lives at home with spouse.   Caffeine Use:    Social Determinants of Health   Financial Resource Strain:   . Difficulty of Paying Living Expenses:   Food Insecurity:   . Worried About Charity fundraiser in the Last Year:   . Arboriculturist in the Last Year:   Transportation Needs:   . Film/video editor (Medical):   Marland Kitchen Lack of Transportation (Non-Medical):   Physical Activity:   . Days of Exercise per Week:   . Minutes of Exercise per Session:   Stress:   . Feeling of Stress :   Social Connections:   . Frequency of Communication with Friends and Family:   . Frequency of Social Gatherings with Friends and Family:   . Attends Religious Services:   . Active Member of Clubs or Organizations:   . Attends Archivist Meetings:   Marland Kitchen Marital Status:   Intimate Partner Violence:   . Fear of Current or Ex-Partner:   . Emotionally Abused:   Marland Kitchen Physically Abused:   . Sexually Abused:      ROS- All systems are reviewed and negative except as per the HPI above.  Physical Exam: Vitals:   01/30/20 0923  BP: (!) 168/84  Pulse: 69  Weight: 122.7 kg  Height: 6' 1.5" (1.867 m)    GEN- The patient is well appearing obese male, alert and oriented x 3 today.   HEENT-head normocephalic, atraumatic, sclera clear, conjunctiva pink, hearing intact, trachea midline. Lungs- Clear to ausculation bilaterally, normal work of breathing Heart- Regular rate and rhythm, no murmurs, rubs or gallops  GI- soft,  NT,  ND, + BS Extremities- no clubbing, cyanosis, or edema MS- no significant deformity or atrophy Skin- no rash or lesion Psych- euthymic mood, full affect Neuro- strength and sensation are intact   Wt Readings from Last 3 Encounters:  01/30/20 122.7 kg  01/16/20 121.6 kg  11/01/19 121.9 kg    EKG today demonstrates SR HR 69, PR 182, QRS 98, QTc 469  Echo 07/19/19 demonstrated   1. Left ventricular ejection fraction, by visual estimation, is 60 to 65%. The left ventricle has normal function. Normal left ventricular size. There is mildly increased left ventricular hypertrophy.  2. Definity contrast agent was given IV to delineate the left ventricular endocardial borders.  3. Global right ventricle has normal systolic function.The right ventricular size is normal. No increase in right ventricular wall thickness.  4. Left atrial size was mildly dilated.  5. Right atrial size was normal.  6. The mitral valve is normal in structure. Trace mitral valve regurgitation. No evidence of mitral stenosis.  7. The tricuspid valve is normal in structure. Tricuspid valve regurgitation was not visualized by color flow Doppler.  8. The aortic valve is tricuspid Aortic valve regurgitation was not visualized by color flow Doppler. Mild aortic valve sclerosis without stenosis.  9. The pulmonic valve was normal in structure. Pulmonic valve regurgitation is not visualized by color flow Doppler. 10. The inferior vena cava is normal in size with greater than 50% respiratory variability, suggesting right atrial pressure of 3 mmHg.  Epic records are reviewed at length today  Assessment and Plan:  1. Paroxysmal atrial fibrillation S/p dofetilide loading 12/15-12/18/20. Patient appears to be maintaining SR. Continue dofetilide 500 mcg BID. QT stable. Check bmet/mag today. Continue Xarelto 20 mg daily.  Continue Toprol 50 mg daily Continue diltiazem 30 mg PRN q4hrs for heart racing. We discussed ILR today  to screen for arrhythmogenic cause of his blurred vision episodes. Patient in agreement with plan.   This patients CHA2DS2-VASc Score and unadjusted Ischemic Stroke Rate (% per year) is equal to 7.2 % stroke rate/year from a score of 5  Above score calculated as 1 point each if present [CHF, HTN, DM, Vascular=MI/PAD/Aortic Plaque, Age if 65-74, or Male] Above score calculated as 2 points each if present [Age > 75, or Stroke/TIA/TE]  2. Obesity Body mass index is 35.19 kg/m. Lifestyle modification was discussed and encouraged including regular physical activity and weight reduction.  3. Obstructive sleep apnea The importance of adequate treatment of sleep apnea was discussed today in order to improve our ability to maintain sinus rhythm long term. Patient reports compliance with CPAP therapy.  4. CAD No anginal symptoms. Normal stress myoveiw 10/17/19.  5. HTN Elevated today, has been well controlled at home and at previous visits. No change today.   Follow up with Dr Rayann Heman as scheduled. AF clinic in 3 months.    [facility] [address] [place],  [address] [phone] 01/30/2020 9:54 AM

## 2020-01-30 ENCOUNTER — Ambulatory Visit (HOSPITAL_COMMUNITY)
Admission: RE | Admit: 2020-01-30 | Discharge: 2020-01-30 | Disposition: A | Discharge status: Home / Self Care | Payer: Medicare PPO | Source: Ambulatory Visit | Attending: Physician Assistant | Admitting: Physician Assistant

## 2020-01-30 ENCOUNTER — Ambulatory Visit (HOSPITAL_COMMUNITY): Payer: Medicare PPO | Admitting: Physician Assistant

## 2020-01-30 ENCOUNTER — Other Ambulatory Visit: Payer: Self-pay

## 2020-01-30 VITALS — BP 168/84 | HR 69 | Ht 73.5 in | Wt 270.4 lb

## 2020-01-30 DIAGNOSIS — Z96652 Presence of left artificial knee joint: Secondary | ICD-10-CM | POA: Diagnosis not present

## 2020-01-30 DIAGNOSIS — Z7901 Long term (current) use of anticoagulants: Secondary | ICD-10-CM | POA: Insufficient documentation

## 2020-01-30 DIAGNOSIS — I480 Paroxysmal atrial fibrillation: Secondary | ICD-10-CM | POA: Diagnosis not present

## 2020-01-30 DIAGNOSIS — D6869 Other thrombophilia: Secondary | ICD-10-CM | POA: Diagnosis not present

## 2020-01-30 DIAGNOSIS — K219 Gastro-esophageal reflux disease without esophagitis: Secondary | ICD-10-CM | POA: Insufficient documentation

## 2020-01-30 DIAGNOSIS — I2510 Atherosclerotic heart disease of native coronary artery without angina pectoris: Secondary | ICD-10-CM | POA: Insufficient documentation

## 2020-01-30 DIAGNOSIS — M1990 Unspecified osteoarthritis, unspecified site: Secondary | ICD-10-CM | POA: Insufficient documentation

## 2020-01-30 DIAGNOSIS — Z880 Allergy status to penicillin: Secondary | ICD-10-CM | POA: Diagnosis not present

## 2020-01-30 DIAGNOSIS — Z888 Allergy status to other drugs, medicaments and biological substances status: Secondary | ICD-10-CM | POA: Diagnosis not present

## 2020-01-30 DIAGNOSIS — Z79899 Other long term (current) drug therapy: Secondary | ICD-10-CM | POA: Diagnosis not present

## 2020-01-30 DIAGNOSIS — Z885 Allergy status to narcotic agent status: Secondary | ICD-10-CM | POA: Diagnosis not present

## 2020-01-30 DIAGNOSIS — G4733 Obstructive sleep apnea (adult) (pediatric): Secondary | ICD-10-CM | POA: Diagnosis not present

## 2020-01-30 DIAGNOSIS — Z87891 Personal history of nicotine dependence: Secondary | ICD-10-CM | POA: Insufficient documentation

## 2020-01-30 DIAGNOSIS — Z8673 Personal history of transient ischemic attack (TIA), and cerebral infarction without residual deficits: Secondary | ICD-10-CM | POA: Diagnosis not present

## 2020-01-30 DIAGNOSIS — I10 Essential (primary) hypertension: Secondary | ICD-10-CM | POA: Insufficient documentation

## 2020-01-30 DIAGNOSIS — Z6835 Body mass index (BMI) 35.0-35.9, adult: Secondary | ICD-10-CM | POA: Diagnosis not present

## 2020-01-30 DIAGNOSIS — E785 Hyperlipidemia, unspecified: Secondary | ICD-10-CM | POA: Diagnosis not present

## 2020-01-30 DIAGNOSIS — Z981 Arthrodesis status: Secondary | ICD-10-CM | POA: Insufficient documentation

## 2020-01-30 DIAGNOSIS — E669 Obesity, unspecified: Secondary | ICD-10-CM | POA: Diagnosis not present

## 2020-01-30 DIAGNOSIS — I4891 Unspecified atrial fibrillation: Secondary | ICD-10-CM | POA: Diagnosis present

## 2020-01-30 LAB — MAGNESIUM: Magnesium: 1.9 mg/dL (ref 1.7–2.4)

## 2020-01-30 LAB — BASIC METABOLIC PANEL
Anion gap: 8 (ref 5–15)
BUN: 26 mg/dL — ABNORMAL HIGH (ref 8–23)
CO2: 24 mmol/L (ref 22–32)
Calcium: 9.7 mg/dL (ref 8.9–10.3)
Chloride: 106 mmol/L (ref 98–111)
Creatinine, Ser: 1.33 mg/dL — ABNORMAL HIGH (ref 0.61–1.24)
GFR calc Af Amer: >60 mL/min (ref >60)
GFR calc non Af Amer: 53 mL/min — ABNORMAL LOW (ref >60)
Glucose, Bld: 124 mg/dL — ABNORMAL HIGH (ref 70–99)
Potassium: 4.7 mmol/L (ref 3.5–5.1)
Sodium: 138 mmol/L (ref 135–145)

## 2020-01-31 ENCOUNTER — Institutional Professional Consult (permissible substitution): Payer: Medicare PPO | Admitting: Neurology

## 2020-02-01 ENCOUNTER — Encounter: Payer: Self-pay | Admitting: Internal Medicine

## 2020-02-01 ENCOUNTER — Telehealth (INDEPENDENT_AMBULATORY_CARE_PROVIDER_SITE_OTHER): Payer: Medicare PPO | Admitting: Internal Medicine

## 2020-02-01 VITALS — BP 131/61 | HR 62 | Ht 73.5 in | Wt 268.0 lb

## 2020-02-01 DIAGNOSIS — G4733 Obstructive sleep apnea (adult) (pediatric): Secondary | ICD-10-CM

## 2020-02-01 DIAGNOSIS — D6869 Other thrombophilia: Secondary | ICD-10-CM | POA: Diagnosis not present

## 2020-02-01 DIAGNOSIS — I480 Paroxysmal atrial fibrillation: Secondary | ICD-10-CM

## 2020-02-01 DIAGNOSIS — I10 Essential (primary) hypertension: Secondary | ICD-10-CM

## 2020-02-01 DIAGNOSIS — R55 Syncope and collapse: Secondary | ICD-10-CM

## 2020-02-01 DIAGNOSIS — I2510 Atherosclerotic heart disease of native coronary artery without angina pectoris: Secondary | ICD-10-CM

## 2020-02-01 NOTE — Progress Notes (Signed)
Electrophysiology TeleHealth Note   Due to national recommendations of social distancing due to [place] 19, Audio/video telehealth visit is felt to be most appropriate for this patient at this time.  See MyChart message from today for patient consent regarding telehealth for [facility].   Date:  02/01/2020   ID:  Corey [name], DOB 06/10/1946, MRN [id number]  Location: home Provider location: [place] Evaluation Performed: New patient consult  PCP:  [name], MD  Cardiologist:  Mertie Moores, MD  Electrophysiologist:  None   Chief Complaint:  seizure  History of Present Illness:    Corey [name] is a 74 y.o. male who presents via audio/video conferencing for a telehealth visit today.   The patient is referred for new consultation regarding possible arrhythmogenic source for his seizure like events by Dr Leonie Man.  The patient reports having an event last summer where he was walking in his yard and became abruptly weak.  He grabbed the side of his truck to prevent falling.  The event was transient. He reports this past august that he had an abrupt sensation as if he was going to pass out.  He had associated visual changes.  This occurred around 4pm.  He reports withiin several seconds the episode resolves though it takes him a couple minutes to recover. He denies tachypalpitations, headache, diaphoresis, or other symptoms.  On April 5th, he had another presyncopal event while he was driving.  He has abrupt presyncope.  He had blurred vision.  The episode resolved very quickly.  He did have stroke in 2013.  She was evaluated for above symptoms by neurology and had an eeg which was normal.  Today, he denies symptoms of palpitations, chest pain, shortness of breath, orthopnea, PND, lower extremity edema, claudication, dizziness, presyncope, syncope, bleeding, or neurologic sequela. The patient is tolerating medications without difficulties and is otherwise without complaint  today.     Past Medical History:  Diagnosis Date  . Arthritis    "back, right knee, hands, ankles, neck" (05/31/2016)  . Atrial fibrillation with RVR ([place])   . Carotid artery disease ([place]) 08/30/2016   Carotid US 11/19: R 1-39, L 40-59 // Carotid US 08/2019: R 1-39; L 40-59; repeat 1 year  . Chronic lower back pain   . Coronary atherosclerosis of native coronary artery    a. BMS to [facility] 2004 and 2007, otherwise mild nonobstructive disease. EF normal.  . Diverticulitis   . Dyslipidemia   . Essential hypertension, benign   . GERD (gastroesophageal reflux disease)   . Lumbar radiculopathy, chronic 02/04/2015   Right L5  . Obesity   . OSA on CPAP   . Paroxysmal atrial fibrillation ([place])    a. Discovered after stroke.  . Pneumonia 01/1996  . PONV (postoperative nausea and vomiting)   . Recurrent upper respiratory infection (URI)   . Visit for monitoring Tikosyn therapy 09/18/2019    Past Surgical History:  Procedure Laterality Date  . ANTERIOR CERVICAL DECOMP/DISCECTOMY FUSION  07/2001; 10/2002   "C5-6; C6-7; redo"  . BACK SURGERY    . CARPAL TUNNEL RELEASE Left 10/2015  . COLONOSCOPY W/ POLYPECTOMY  02/2014  . CORONARY ANGIOPLASTY WITH STENT PLACEMENT  05/2003; 12/2005   "mid RCA; mid RCA"  . HAND SURGERY  02/2019   LEFT HAND  . JOINT REPLACEMENT    . KNEE ARTHROSCOPY Left 10/2005  . KNEE ARTHROSCOPY W/ PARTIAL MEDIAL MENISCECTOMY Left 09/2005  . LUMBAR LAMINECTOMY/DECOMPRESSION MICRODISCECTOMY  03/2005   "  L4-5"  . POSTERIOR LUMBAR FUSION  10/2003   L5-S1; "plates, screws"  . SHOULDER ARTHROSCOPY Right 08/2011   Debridement of labrum, arthroscopic distal clavicle excision  . SHOULDER OPEN ROTATOR CUFF REPAIR Left 07/2014  . TEE WITHOUT CARDIOVERSION  07/07/2012   Procedure: TRANSESOPHAGEAL ECHOCARDIOGRAM (TEE);  Surgeon: Fay Records, MD;  Location: [facility];  Service: Cardiovascular;  Laterality: N/A;  . TONSILLECTOMY AND ADENOIDECTOMY  ~ 1956  . TOTAL KNEE ARTHROPLASTY Left  10/2006  . TRIGGER FINGER RELEASE Left 10/2015    Current Outpatient Medications  Medication Sig Dispense Refill  . acetaminophen (TYLENOL 8 HOUR ARTHRITIS PAIN) 650 MG CR tablet Take 1,300 mg by mouth as needed for pain (pain).     Marland Kitchen ALPRAZolam (XANAX) 0.5 MG tablet Take 0.5 mg by mouth as needed for anxiety.   1  . atorvastatin (LIPITOR) 40 MG tablet Take 1 tablet (40 mg total) by mouth daily. 90 tablet 3  . clindamycin (CLEOCIN) 300 MG capsule clindamycin HCl 300 mg capsule  TAKE 2 CAPSULES BY MOUTH 1 HOUR PRIOR TO DENTAL TREATMENT    . diltiazem (CARDIZEM) 30 MG tablet Take 1 tablet (30 mg total) by mouth 4 (four) times daily as needed. 60 tablet 6  . docusate sodium (COLACE) 100 MG capsule Take 100 mg by mouth 2 (two) times daily.    Marland Kitchen dofetilide (TIKOSYN) 500 MCG capsule TAKE 1 CAPSULE BY MOUTH 2 TIMES DAILY. 108 capsule 3  . fenofibrate 160 MG tablet TAKE 1 TABLET BY MOUTH EVERY DAY 90 tablet 3  . finasteride (PROSCAR) 5 MG tablet Take 5 mg by mouth daily.     . fluticasone (FLONASE) 50 MCG/ACT nasal spray Place 2 sprays into the nose daily.     Marland Kitchen levocetirizine (XYZAL) 5 MG tablet Take 5 mg by mouth every evening.    Marland Kitchen lisinopril (ZESTRIL) 10 MG tablet Take 1 tablet (10 mg total) by mouth daily. 90 tablet 3  . magnesium oxide (MAG-OX) 400 MG tablet Take 1 tablet (400 mg total) by mouth daily. 90 tablet 3  . metoprolol succinate (TOPROL-XL) 50 MG 24 hr tablet Take 1 tablet (50 mg total) by mouth daily. Take with or immediately following a meal. 90 tablet 3  . Multiple Vitamins-Minerals (MULTIVITAMINS THER. W/MINERALS) TABS Take 1 tablet by mouth daily.      . nitroGLYCERIN (NITROSTAT) 0.4 MG SL tablet Place 1 tablet (0.4 mg total) under the tongue every 5 (five) minutes as needed for chest pain. 25 tablet 5  . omeprazole (PRILOSEC) 10 MG capsule Take 1 capsule (10 mg total) by mouth daily. 90 capsule 3  . potassium chloride (K-DUR) 10 MEQ tablet Take 1 tablet (10 mEq total) by mouth  daily. 90 tablet 3  . saccharomyces boulardii (FLORASTOR) 250 MG capsule Take 250 mg by mouth daily. [employer]    . sildenafil (REVATIO) 20 MG tablet Take 2 to 5 tabs daily as needed for erectile dysfunction 30 tablet 2  . Tamsulosin HCl (FLOMAX) 0.4 MG CAPS Take 0.4 mg by mouth daily.      Marland Kitchen topiramate (TOPAMAX) 50 MG tablet Take 2 tablets (100 mg total) by mouth 2 (two) times daily. 360 tablet 3  . XARELTO 20 MG TABS tablet TAKE 1 TABLET BY MOUTH EVERY DAY 90 tablet 3   No current facility-administered medications for this visit.    Allergies:   Amoxicillin, Fosinopril, Ampicillin, Codeine, Monopril [fosinopril sodium], Dilaudid [hydromorphone hcl], and Percocet [oxycodone-acetaminophen]   Social History:  The patient  reports that he quit smoking about 16 years ago. His smoking use included cigarettes. He has a 34.00 pack-year smoking history. He has never used smokeless tobacco. He reports that he does not drink alcohol or use drugs.   Family History:  The patient's family history includes Heart attack in his father; Heart disease in his father; Hypertension in his mother.    ROS:  Please see the history of present illness.   All other systems are personally reviewed and negative.    Exam:    Vital Signs:  BP 131/61   Pulse 62   Ht 6' 1.5" (1.867 m)   Wt 268 lb (121.6 kg)   BMI 34.88 kg/m    Well appearing, alert and conversant, regular work of breathing,  good skin color Eyes- anicteric, neuro- grossly intact, skin- no apparent rash or lesions or cyanosis, mouth- oral mucosa is pink   Labs/Other Tests and Data Reviewed:    Recent Labs: 07/10/2019: TSH 2.310 09/07/2019: Hemoglobin 14.8; Platelets 265 01/30/2020: BUN 26; Creatinine, Ser 1.33; Magnesium 1.9; Potassium 4.7; Sodium 138   Wt Readings from Last 3 Encounters:  02/01/20 268 lb (121.6 kg)  01/30/20 270 lb 6.4 oz (122.7 kg)  01/16/20 268 lb (121.6 kg)     Neurology notes reviewed AF clinic notes reviewed Recent  ekg reviewed Echo 07/19/2019 reviewed  ASSESSMENT & PLAN:    1.  Presyncope Worrisome for an arrhythmogenic cause such as NSVT, transient AV block or pauses. Given infrequent nature, unlikely to be found on 30 day monitor. I would therefore advise ILR implantation Risks and benefits to ILR were discussed with patient including risks of bleeding and infection.  Pt understands risk and wishes to proceed at the next available time. I have advised no driving in the interim.  2, paroxysmal afib Doing well with Phyllis Ginger We will continue to follow closely to avoid toxicity with this medicine. ILR will be helpful for afib management for this patient.  Continue xarelto for chads2vasc score of 5   3. OSA Compliant with CPAP  4. HTN Stable No change required today  5. Obesity Lifestyle modification is advised  6. CAD No ischemic symptoms  Patient Risk:  after full review of this patients clinical status, I feel that they are at moderate risk at this time.   Today, I have spent 20 minutes with the patient with telehealth technology discussing presyncope .    SignedThompson Grayer MD, [place] 02/01/2020 10:53 AM   [facility] [address] [place] [address] [phone] (office) [phone] (fax)

## 2020-02-04 ENCOUNTER — Other Ambulatory Visit (HOSPITAL_COMMUNITY): Payer: Self-pay | Admitting: Urology

## 2020-02-04 DIAGNOSIS — C61 Malignant neoplasm of prostate: Secondary | ICD-10-CM

## 2020-02-11 ENCOUNTER — Ambulatory Visit: Payer: Medicare PPO | Admitting: Neurology

## 2020-02-11 ENCOUNTER — Encounter: Payer: Self-pay | Admitting: Neurology

## 2020-02-11 ENCOUNTER — Other Ambulatory Visit: Payer: Self-pay

## 2020-02-11 VITALS — BP 136/76 | HR 60 | Temp 97.7°F | Ht 73.5 in | Wt 270.0 lb

## 2020-02-11 DIAGNOSIS — Z8673 Personal history of transient ischemic attack (TIA), and cerebral infarction without residual deficits: Secondary | ICD-10-CM | POA: Insufficient documentation

## 2020-02-11 DIAGNOSIS — I669 Occlusion and stenosis of unspecified cerebral artery: Secondary | ICD-10-CM | POA: Diagnosis not present

## 2020-02-11 DIAGNOSIS — I480 Paroxysmal atrial fibrillation: Secondary | ICD-10-CM | POA: Diagnosis not present

## 2020-02-11 DIAGNOSIS — I6340 Cerebral infarction due to embolism of unspecified cerebral artery: Secondary | ICD-10-CM

## 2020-02-11 DIAGNOSIS — G4733 Obstructive sleep apnea (adult) (pediatric): Secondary | ICD-10-CM

## 2020-02-11 NOTE — Progress Notes (Signed)
SLEEP MEDICINE CLINIC    Provider:  Larey Seat, MD  Primary Care Physician:  [name], MD [place] [address] [id number]     Referring Provider: Dr. Leonie Man, MD       Chief Complaint according to patient   Patient presents with:    . New Patient (Initial Visit)           HISTORY OF PRESENT ILLNESS:  Corey [name] is a 74 year old Caucasian male patient and seen upon referral on 02/11/2020 for a re-evaluation of OSA care- he was followed by pulmonology.  Dr Leonie Man and Stroke NP have seen the patient just on 4-14- and 4-19 for TIA work up and deferred to Dr. Rexene Alberts for Tremor and to me for OSA.     Chief concern according to patient :   [name] reports that he was just recently diagnosed with prostate cancer after a biopsy showed 12 out of 12 needle biopsies positive for malignancy.  He has been followed for rising PSA over a year.  Dr. Estill Batten at [facility] urology is following him.  He is referred however by my stroke specialist colleague Dr. Leonie Man who was concerned that the patient has reported increasing fatigue and sleepiness in the daytime.  He has also followed with nurse practitioner macule history of essential tremors.  The patient reports ongoing nightly compliance with CPAP and has not a sleep study and probably 15 years or longer.  His current machine is 73 years old he does have documentation of his excellent compliance of 100% over the last 30 days by days and time.  Average use at time of 7 hours 32 minutes is using an air sense 10 AutoSet with a pressure window between 5 and 13 cmH2O 1 cm expiratory pressure relief, residual AHI 1.6/h of sleep no central apneas are emerging.  The median or 95th percentile pressure is 12.3 so he just straddles the top level of pressure needed and he has moderate air leaks.  He hates his mask and is very unhappy with [employer], DME- and has no communication-    He endorsed the fatigue severity score of 61 points  on the Epworth sleepiness score at 8 out of 24 points the geriatric depression scale at 2 out of 15.  The patient also has a family history of Parkinson's affecting his brother..  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  He has been taking Xarelto at dinnertime, atrial fibrillation has been PD.  He had seen Dr. Rexene Alberts for a second opinion, who also felt that he had a essential tremor not Parkinson's disease.  Primidone made him too drowsy and somewhat "loopy" he experienced also fatigue and tiredness on Inderal a beta-blocker which then was discontinued and he has been taking topiramate for tremor control.   I have the pleasure of seeing Corey [name], a right -handed White or Caucasian male with OSA sleep disorder. He  has a past medical history of Arthritis, Carotid artery disease ([place]) (08/30/2016), Chronic lower back pain, Coronary atherosclerosis of native coronary artery, Diverticulitis, Dyslipidemia, Essential hypertension, benign, GERD (gastroesophageal reflux disease), Lumbar radiculopathy, chronic (02/04/2015),  Obesity, OSA on CPAP, Paroxysmal atrial fibrillation ([place]), Pneumonia (01/1996), PONV (postoperative nausea and vomiting), Prostate CA ([place]), Recurrent upper respiratory infection (URI), and Visit for monitoring Tikosyn therapy (09/18/2019)..     Family medical Rachelle Hora history: brother with PD, atypical.    Social history: Patient is working as a Mudlogger for [employer], part time, and lives in a household with spouse, he has adult children. 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 Tobacco use remote, 2004.   ETOH use infrequent,  Caffeine intake in form of Coffee( rare ) Soda( /) Tea ( /) nor energy drinks. Regular exercise in form of walking. Hobbies : travelling.   Sleep habits are as follows: The patient's dinner time is between 6.00 PM and he takes Nutritional therapist.  The patient goes to bed at 11.30 PM and promptly goes to sleep- continues to sleep for 4 hours, wakes for 1-2 bathroom breaks.  The preferred sleep position is on the side , with the support of 2 pillows.  Dreams are reportedly frequent.   7 AM is the usual rise time. The patient wakes up 6- 7 spontaneously .  He reports not feeling refreshed or restored in AM, but rarely with symptoms such as dry mouth , morning headaches- there is residual fatigue.  Naps are taken infrequently, lasting after lunch time  from 10-25 minutes and feel refreshing.   Review of Systems: Out of a complete 14 system review, the patient complains of only the following symptoms, and all other reviewed systems are negative.:  Fatigue, sleepiness , snoring- but not when on CPAP, no apnea . ' he has a dry throat- air leakage, needs a new mask.    How likely are you to doze in the following situations: 0 = not likely, 1 = slight chance, 2 = moderate chance, 3 = high chance   Sitting and Reading? Watching Television? Sitting inactive in a public place (theater or meeting)? As a passenger in a car for an hour without a break? Lying down in the afternoon when circumstances permit? Sitting and talking to someone? Sitting quietly after lunch without alcohol? In a car, while stopped for a few minutes in traffic?   Total =8 / 24 points   FSS endorsed at 61/ 63 points.    Machine seems to over-pump pressure, no aerophagia.    Social History   Socioeconomic History  . Marital status: Married    Spouse name: [name]  . Number of children: 3  . Years of education: 71  . Highest education level: Not on file  Occupational History  . Occupation: Retired     Fish farm manager: DISABLED     Comment: Works at [employer] part time  Tobacco Use  . Smoking status: Former Smoker    Packs/day: 1.00    Years: 34.00    Pack years: 34.00    Types: Cigarettes    Quit date: 05/05/2003    Years since quitting: 16.7  . Smokeless tobacco: Never Used  Substance and Sexual Activity  . Alcohol use: No    Alcohol/week: 0.0 standard drinks  . Drug use: No  . Sexual activity: Yes  Other Topics Concern  . Not on file  Social History Narrative   Patient lives at home with spouse.   Caffeine Use:    Social Determinants of Health   Financial Resource Strain:   . Difficulty of Paying Living Expenses:   Food Insecurity:   . Worried About [employer] of  Food in the Last Year:   . [place] in the Last Year:   Transportation Needs:   . Film/video editor (Medical):   Marland Kitchen Lack of Transportation (Non-Medical):   Physical Activity:   . Days of Exercise per Week:   . Minutes of Exercise per Session:   Stress:   . Feeling of Stress :   Social Connections:   . Frequency of Communication with Friends and Family:   . Frequency of Social Gatherings with Friends and Family:   . Attends Religious Services:   . Active Member of Clubs or Organizations:   . Attends Archivist Meetings:   Marland Kitchen Marital Status:     Family History  Problem Relation Age of Onset  . Hypertension Mother   . Heart disease Father   . Heart attack Father     Past Medical History:  Diagnosis Date  . Arthritis    "back, right knee, hands, ankles, neck" (05/31/2016)  . Carotid artery disease ([place]) 08/30/2016   Carotid US 11/19: R 1-39, L 40-59 // Carotid US 08/2019: R 1-39; L 40-59; repeat 1 year  . Chronic lower back pain   . Coronary atherosclerosis of native coronary artery    a. BMS to [facility] 2004 and 2007, otherwise mild nonobstructive disease. EF normal.  . Diverticulitis   . Dyslipidemia   . Essential hypertension, benign   . GERD (gastroesophageal reflux disease)   .  Lumbar radiculopathy, chronic 02/04/2015   Right L5  . Obesity   . OSA on CPAP   . Paroxysmal atrial fibrillation ([place])    a. Discovered after stroke.  . Pneumonia 01/1996  . PONV (postoperative nausea and vomiting)   . Prostate CA ([place])   . Recurrent upper respiratory infection (URI)   . Visit for monitoring Tikosyn therapy 09/18/2019    Past Surgical History:  Procedure Laterality Date  . ANTERIOR CERVICAL DECOMP/DISCECTOMY FUSION  07/2001; 10/2002   "C5-6; C6-7; redo"  . BACK SURGERY    . CARPAL TUNNEL RELEASE Left 10/2015  . COLONOSCOPY W/ POLYPECTOMY  02/2014  . CORONARY ANGIOPLASTY WITH STENT PLACEMENT  05/2003; 12/2005   "mid RCA; mid RCA"  . HAND SURGERY  02/2019   LEFT HAND  . JOINT REPLACEMENT    . KNEE ARTHROSCOPY Left 10/2005  . KNEE ARTHROSCOPY W/ PARTIAL MEDIAL MENISCECTOMY Left 09/2005  . LUMBAR LAMINECTOMY/DECOMPRESSION MICRODISCECTOMY  03/2005   "L4-5"  . POSTERIOR LUMBAR FUSION  10/2003   L5-S1; "plates, screws"  . SHOULDER ARTHROSCOPY Right 08/2011   Debridement of labrum, arthroscopic distal clavicle excision  . SHOULDER OPEN ROTATOR CUFF REPAIR Left 07/2014  . TEE WITHOUT CARDIOVERSION  07/07/2012   Procedure: TRANSESOPHAGEAL ECHOCARDIOGRAM (TEE);  Surgeon: Fay Records, MD;  Location: [facility];  Service: Cardiovascular;  Laterality: N/A;  . TONSILLECTOMY AND ADENOIDECTOMY  ~ 1956  . TOTAL KNEE ARTHROPLASTY Left 10/2006  . TRIGGER FINGER RELEASE Left 10/2015     Current Outpatient Medications on File Prior to Visit  Medication Sig Dispense Refill  . acetaminophen (TYLENOL 8 HOUR ARTHRITIS PAIN) 650 MG CR tablet Take 1,300 mg by mouth as needed for pain (pain).     Marland Kitchen ALPRAZolam (XANAX) 0.5 MG tablet Take 0.5 mg by mouth as needed for anxiety.   1  . atorvastatin (LIPITOR) 40 MG tablet Take 1 tablet (40 mg total) by mouth daily. 90 tablet 3  . clindamycin (CLEOCIN) 300 MG capsule clindamycin HCl 300 mg capsule  TAKE 2 CAPSULES  BY MOUTH 1 HOUR PRIOR TO DENTAL  TREATMENT    . diltiazem (CARDIZEM) 30 MG tablet Take 1 tablet (30 mg total) by mouth 4 (four) times daily as needed. 60 tablet 6  . docusate sodium (COLACE) 100 MG capsule Take 100 mg by mouth 2 (two) times daily.    Marland Kitchen dofetilide (TIKOSYN) 500 MCG capsule TAKE 1 CAPSULE BY MOUTH 2 TIMES DAILY. 108 capsule 3  . fenofibrate 160 MG tablet TAKE 1 TABLET BY MOUTH EVERY DAY 90 tablet 3  . finasteride (PROSCAR) 5 MG tablet Take 5 mg by mouth daily.     . fluticasone (FLONASE) 50 MCG/ACT nasal spray Place 2 sprays into the nose daily.     Marland Kitchen levocetirizine (XYZAL) 5 MG tablet Take 5 mg by mouth every evening.    Marland Kitchen lisinopril (ZESTRIL) 10 MG tablet Take 1 tablet (10 mg total) by mouth daily. 90 tablet 3  . magnesium oxide (MAG-OX) 400 MG tablet Take 1 tablet (400 mg total) by mouth daily. 90 tablet 3  . metoprolol succinate (TOPROL-XL) 50 MG 24 hr tablet Take 1 tablet (50 mg total) by mouth daily. Take with or immediately following a meal. 90 tablet 3  . Multiple Vitamins-Minerals (MULTIVITAMINS THER. W/MINERALS) TABS Take 1 tablet by mouth daily.      . nitroGLYCERIN (NITROSTAT) 0.4 MG SL tablet Place 1 tablet (0.4 mg total) under the tongue every 5 (five) minutes as needed for chest pain. 25 tablet 5  . omeprazole (PRILOSEC) 10 MG capsule Take 1 capsule (10 mg total) by mouth daily. 90 capsule 3  . potassium chloride (K-DUR) 10 MEQ tablet Take 1 tablet (10 mEq total) by mouth daily. 90 tablet 3  . saccharomyces boulardii (FLORASTOR) 250 MG capsule Take 250 mg by mouth daily. [facility]    . sildenafil (REVATIO) 20 MG tablet Take 2 to 5 tabs daily as needed for erectile dysfunction 30 tablet 2  . Tamsulosin HCl (FLOMAX) 0.4 MG CAPS Take 0.4 mg by mouth daily.      Marland Kitchen topiramate (TOPAMAX) 50 MG tablet Take 2 tablets (100 mg total) by mouth 2 (two) times daily. 360 tablet 3  . XARELTO 20 MG TABS tablet TAKE 1 TABLET BY MOUTH EVERY DAY 90 tablet 3   No current facility-administered medications on file prior  to visit.    Allergies  Allergen Reactions  . Amoxicillin Swelling  . Fosinopril Other (See Comments)  . Ampicillin Rash  . Codeine Nausea Only and Other (See Comments)    Heavy amounts cause nausea  . Monopril [Fosinopril Sodium] Other (See Comments)    Muscle aches & pain  . Dilaudid [Hydromorphone Hcl] Nausea Only  . Percocet [Oxycodone-Acetaminophen] Nausea And Vomiting    Physical exam:  Today's Vitals   02/11/20 1119  BP: 136/76  Pulse: 60  Temp: 97.7 F (36.5 C)  Weight: 270 lb (122.5 kg)  Height: 6' 1.5" (1.867 m)   Body mass index is 35.14 kg/m.   Wt Readings from Last 3 Encounters:  02/11/20 270 lb (122.5 kg)  02/01/20 268 lb (121.6 kg)  01/30/20 270 lb 6.4 oz (122.7 kg)     Ht Readings from Last 3 Encounters:  02/11/20 6' 1.5" (1.867 m)  02/01/20 6' 1.5" (1.867 m)  01/30/20 6' 1.5" (1.867 m)      General: The patient is awake, alert and appears not in acute distress. The patient is well groomed. Head: Normocephalic, atraumatic. Neck is supple. Mallampati  3,  neck circumference:18.  5  inches .  Nasal airflow patent.  Retrognathia is not  seen.  Dental status: native  Cardiovascular:  Regular rate and cardiac rhythm by pulse,  without distended neck veins. Respiratory: Lungs are clear to auscultation.  Skin:  Without evidence of ankle edema, or rash. Trunk: The patient's posture is erect.   Neurologic exam : The patient is awake and alert, oriented to place and time.   Memory subjective described as intact.  Attention span & concentration ability appears normal.  Speech is fluent,  without  dysarthria, dysphonia or aphasia.  Mood and affect are appropriate.   Cranial nerves: no loss of smell or taste reported  Pupils are equal and briskly reactive to light. Funduscopic exam deferred.   Extraocular movements in vertical and horizontal planes were intact and without nystagmus. No Diplopia. Visual fields by finger perimetry are intact.  Hearing  was intact to soft voice and finger rubbing.    Facial sensation intact to fine touch.  Facial motor strength is symmetric and tongue and uvula move midline.  Facial tremor, jaw.  Neck ROM : rotation, tilt and flexion extension were normal for age and shoulder shrug was symmetrical.    Motor exam:  Symmetric bulk, tone and ROM.   Normal tone without cog -wheeling, symmetric grip strength .   Sensory:  Fine touch, pinprick and vibration were normal.  Proprioception tested in the upper extremities was normal.   Coordination: Rapid alternating movements in the fingers/hands were of normal speed.  The Finger-to-nose maneuver was intact without evidence of ataxia, dysmetria or tremor.   Gait and station: Patient could rise unassisted from a seated position, walked without assistive device.  Stance is of normal width/ base .  Toe and heel walk were deferred.  Deep tendon reflexes: in the  upper and lower extremities are symmetric and intact.  Babinski response was deferred.        After spending a total time of 46 minutes face to face and additional time for physical and neurologic examination, review of laboratory studies,  personal review of imaging studies, reports and results of other testing and review of referral information / records as far as provided in visit, I have established the following assessments:  1)  Patient with excessive daytime sleepiness and fatigue while being a 100% compliant user of CPAP. He has air-leaks, needs a new mask and better seal.     Tykosin- He is having atrial fibrillation, but his rate is well controlled. He is awaiting a loop recorder to be  implanted. Status post TIA and stroke in the past. Please re-titrate after a 2 hour baseline-  This patient if fully Covid vaccinated - will bring his card.  He wants to be fitted for a ResMed F 30 I, please give him a chance to try.  Wants to change DME . currently [facility]     2) his fatigue may be related to  prostate cancer and medication for essential tremor.   3) obesity - risk factor. BMI is over 35. large neck and high grade airway obstruction.    My Plan is to proceed with:  1) Repeat sleep study- he will likely get a HST ( [facility]). I would prefer attended sleep study in a a fib and cardiac patient.    I would like to thank Dr Leonie Man, MD at [facility]  for allowing me to meet with and to take care of this pleasant patient.    CC: I will share my  notes with PCP .   Electronically signed by: Larey Seat, MD 02/11/2020 11:47 AM  [name] Board certified by The [employer] of Sleep Medicine and Diplomate of the [employer] of Sleep Medicine. Board certified In Neurology through the [place], Fellow of the [employer] of Neurology. Medical Director of [facility].

## 2020-02-11 NOTE — Patient Instructions (Signed)
Atrial Fibrillation  Atrial fibrillation is a type of irregular or rapid heartbeat (arrhythmia). In atrial fibrillation, the top part of the heart (atria) beats in an irregular pattern. This makes the heart unable to pump blood normally and effectively. The goal of treatment is to prevent blood clots from forming, control your heart rate, or restore your heartbeat to a normal rhythm. If this condition is not treated, it can cause serious problems, such as a weakened heart muscle (cardiomyopathy) or a stroke. What are the causes? This condition is often caused by medical conditions that damage the heart's electrical system. These include:  High blood pressure (hypertension). This is the most common cause.  Certain heart problems or conditions, such as heart failure, coronary artery disease, heart valve problems, or heart surgery.  Diabetes.  Overactive thyroid (hyperthyroidism).  Obesity.  Chronic kidney disease. In some cases, the cause of this condition is not known. What increases the risk? This condition is more likely to develop in:  Older people.  People who smoke.  Athletes who do endurance exercise.  People who have a family history of atrial fibrillation.  Men.  People who use drugs.  People who drink a lot of alcohol.  People who have lung conditions, such as emphysema, pneumonia, or COPD.  People who have obstructive sleep apnea. What are the signs or symptoms? Symptoms of this condition include:  A feeling that your heart is racing or beating irregularly.  Discomfort or pain in your chest.  Shortness of breath.  Sudden light-headedness or weakness.  Tiring easily during exercise or activity.  Fatigue.  Syncope (fainting).  Sweating. In some cases, there are no symptoms. How is this diagnosed? Your health care provider may detect atrial fibrillation when taking your pulse. If detected, this condition may be diagnosed with:  An electrocardiogram  (ECG) to check electrical signals of the heart.  An ambulatory cardiac monitor to record your heart's activity for a few days.  A transthoracic echocardiogram (TTE) to create pictures of your heart.  A transesophageal echocardiogram (TEE) to create even closer pictures of your heart.  A stress test to check your blood supply while you exercise.  Imaging tests, such as a CT scan or chest X-ray.  Blood tests. How is this treated? Treatment depends on underlying conditions and how you feel when you experience atrial fibrillation. This condition may be treated with:  Medicines to prevent blood clots or to treat heart rate or heart rhythm problems.  Electrical cardioversion to reset the heart's rhythm.  A pacemaker to correct abnormal heart rhythm.  Ablation to remove the heart tissue that sends abnormal signals.  Left atrial appendage closure to seal the area where blood clots can form. In some cases, underlying conditions will be treated. Follow these instructions at home: Medicines  Take over-the counter and prescription medicines only as told by your health care provider.  Do not take any new medicines without talking to your health care provider.  If you are taking blood thinners: ? Talk with your health care provider before you take any medicines that contain aspirin or NSAIDs, such as ibuprofen. These medicines increase your risk for dangerous bleeding. ? Take your medicine exactly as told, at the same time every day. ? Avoid activities that could cause injury or bruising, and follow instructions about how to prevent falls. ? Wear a medical alert bracelet or carry a card that lists what medicines you take. Lifestyle      Do not use any products   that contain nicotine or tobacco, such as cigarettes, e-cigarettes, and chewing tobacco. If you need help quitting, ask your health care provider.  Eat heart-healthy foods. Talk with a dietitian to make an eating plan that is  right for you.  Exercise regularly as told by your health care provider.  Do not drink alcohol.  Lose weight if you are overweight.  Do not use drugs, including cannabis. General instructions  If you have obstructive sleep apnea, manage your condition as told by your health care provider.  Do not use diet pills unless your health care provider approves. Diet pills can make heart problems worse.  Keep all follow-up visits as told by your health care provider. This is important. Contact a health care provider if you:  Notice a change in the rate, rhythm, or strength of your heartbeat.  Are taking a blood thinner and you notice more bruising.  Tire more easily when you exercise or do heavy work.  Have a sudden change in weight. Get help right away if you have:   Chest pain, abdominal pain, sweating, or weakness.  Trouble breathing.  Side effects of blood thinners, such as blood in your vomit, stool, or urine, or bleeding that cannot stop.  Any symptoms of a stroke. "BE FAST" is an easy way to remember the main warning signs of a stroke: ? B - Balance. Signs are dizziness, sudden trouble walking, or loss of balance. ? E - Eyes. Signs are trouble seeing or a sudden change in vision. ? F - Face. Signs are sudden weakness or numbness of the face, or the face or eyelid drooping on one side. ? A - Arms. Signs are weakness or numbness in an arm. This happens suddenly and usually on one side of the body. ? S - Speech. Signs are sudden trouble speaking, slurred speech, or trouble understanding what people say. ? T - Time. Time to call emergency services. Write down what time symptoms started.  Other signs of a stroke, such as: ? A sudden, severe headache with no known cause. ? Nausea or vomiting. ? Seizure. These symptoms may represent a serious problem that is an emergency. Do not wait to see if the symptoms will go away. Get medical help right away. Call your local emergency  services (911 in the [place] Do not drive yourself to the hospital. Summary  Atrial fibrillation is a type of irregular or rapid heartbeat (arrhythmia).  Symptoms include a feeling that your heart is beating fast or irregularly.  You may be given medicines to prevent blood clots or to treat heart rate or heart rhythm problems.  Get help right away if you have signs or symptoms of a stroke.  Get help right away if you cannot catch your breath or have chest pain or pressure. This information is not intended to replace advice given to you by your health care provider. Make sure you discuss any questions you have with your health care provider. Document Revised: 03/14/2019 Document Reviewed: 03/14/2019 Elsevier Patient Education  2020 Elsevier Inc. Quality Sleep Information, Adult Quality sleep is important for your mental and physical health. It also improves your quality of life. Quality sleep means you:  Are asleep for most of the time you are in bed.  Fall asleep within 30 minutes.  Wake up no more than once a night.  Are awake for no longer than 20 minutes if you do wake up during the night. Most adults need 7-8 hours of quality sleep each   night. How can poor sleep affect me? If you do not get enough quality sleep, you may have:  Mood swings.  Daytime sleepiness.  Confusion.  Decreased reaction time.  Sleep disorders, such as insomnia and sleep apnea.  Difficulty with: ? Solving problems. ? Coping with stress. ? Paying attention. These issues may affect your performance and productivity at work, school, and at home. Lack of sleep may also put you at higher risk for accidents, suicide, and risky behaviors. If you do not get quality sleep you may also be at higher risk for several health problems, including:  Infections.  Type 2 diabetes.  Heart disease.  High blood pressure.  Obesity.  Worsening of long-term conditions, like arthritis, kidney disease,  depression, Parkinson's disease, and epilepsy. What actions can I take to get more quality sleep?      Stick to a sleep schedule. Go to sleep and wake up at about the same time each day. Do not try to sleep less on weekdays and make up for lost sleep on weekends. This does not work.  Try to get about 30 minutes of exercise on most days. Do not exercise 2-3 hours before going to bed.  Limit naps during the day to 30 minutes or less.  Do not use any products that contain nicotine or tobacco, such as cigarettes or e-cigarettes. If you need help quitting, ask your health care provider.  Do not drink caffeinated beverages for at least 8 hours before going to bed. Coffee, tea, and some sodas contain caffeine.  Do not drink alcohol close to bedtime.  Do not eat large meals close to bedtime.  Do not take naps in the late afternoon.  Try to get at least 30 minutes of sunlight every day. Morning sunlight is best.  Make time to relax before bed. Reading, listening to music, or taking a hot bath promotes quality sleep.  Make your bedroom a place that promotes quality sleep. Keep your bedroom dark, quiet, and at a comfortable room temperature. Make sure your bed is comfortable. Take out sleep distractions like TV, a computer, smartphone, and bright lights.  If you are lying awake in bed for longer than 20 minutes, get up and do a relaxing activity until you feel sleepy.  Work with your health care provider to treat medical conditions that may affect sleeping, such as: ? Nasal obstruction. ? Snoring. ? Sleep apnea and other sleep disorders.  Talk to your health care provider if you think any of your prescription medicines may cause you to have difficulty falling or staying asleep.  If you have sleep problems, talk with a sleep consultant. If you think you have a sleep disorder, talk with your health care provider about getting evaluated by a specialist. Where to find more  information  National Sleep Foundation website: https://sleepfoundation.org  National Heart, Lung, and Blood Institute (NHLBI): www.nhlbi.nih.gov/files/docs/public/sleep/healthy_sleep.pdf  Centers for Disease Control and Prevention (CDC): www.cdc.gov/sleep/index.html Contact a health care provider if you:  Have trouble getting to sleep or staying asleep.  Often wake up very early in the morning and cannot get back to sleep.  Have daytime sleepiness.  Have daytime sleep attacks of suddenly falling asleep and sudden muscle weakness (narcolepsy).  Have a tingling sensation in your legs with a strong urge to move your legs (restless legs syndrome).  Stop breathing briefly during sleep (sleep apnea).  Think you have a sleep disorder or are taking a medicine that is affecting your quality of sleep. Summary    Most adults need 7-8 hours of quality sleep each night.  Getting enough quality sleep is an important part of health and well-being.  Make your bedroom a place that promotes quality sleep and avoid things that may cause you to have poor sleep, such as alcohol, caffeine, smoking, and large meals.  Talk to your health care provider if you have trouble falling asleep or staying asleep. This information is not intended to replace advice given to you by your health care provider. Make sure you discuss any questions you have with your health care provider. Document Revised: 12/28/2017 Document Reviewed: 12/28/2017 Elsevier Patient Education  2020 Elsevier Inc.  

## 2020-02-12 ENCOUNTER — Other Ambulatory Visit: Payer: Self-pay | Admitting: Neurology

## 2020-02-12 ENCOUNTER — Telehealth: Payer: Self-pay

## 2020-02-12 DIAGNOSIS — Z8673 Personal history of transient ischemic attack (TIA), and cerebral infarction without residual deficits: Secondary | ICD-10-CM

## 2020-02-12 DIAGNOSIS — Z9989 Dependence on other enabling machines and devices: Secondary | ICD-10-CM

## 2020-02-12 DIAGNOSIS — G4733 Obstructive sleep apnea (adult) (pediatric): Secondary | ICD-10-CM

## 2020-02-12 NOTE — Telephone Encounter (Signed)
Need a split night order instead of cpap to split patient to get qualified for new machine.

## 2020-02-12 NOTE — Telephone Encounter (Signed)
Order placed for the patient.

## 2020-02-18 ENCOUNTER — Encounter (HOSPITAL_COMMUNITY)
Admission: RE | Admit: 2020-02-18 | Discharge: 2020-02-18 | Disposition: A | Discharge status: Home / Self Care | Payer: Medicare PPO | Source: Ambulatory Visit | Attending: Urology | Admitting: Urology

## 2020-02-18 ENCOUNTER — Other Ambulatory Visit: Payer: Self-pay

## 2020-02-18 DIAGNOSIS — C61 Malignant neoplasm of prostate: Secondary | ICD-10-CM

## 2020-02-18 MED ORDER — TECHNETIUM TC 99M MEDRONATE IV KIT
21.4000 | PACK | Freq: Once | INTRAVENOUS | Status: AC
  Administered 2020-02-18: 21.4 via INTRAVENOUS

## 2020-02-19 ENCOUNTER — Telehealth: Payer: Self-pay

## 2020-02-19 NOTE — Telephone Encounter (Signed)
LVM for pt to call me back to schedule sleep study  

## 2020-02-27 ENCOUNTER — Other Ambulatory Visit: Payer: Self-pay

## 2020-02-27 ENCOUNTER — Encounter: Payer: Self-pay | Admitting: Internal Medicine

## 2020-02-27 ENCOUNTER — Ambulatory Visit: Payer: Medicare PPO | Admitting: Internal Medicine

## 2020-02-27 VITALS — BP 132/68 | HR 59 | Ht 73.5 in | Wt 271.0 lb

## 2020-02-27 DIAGNOSIS — I2510 Atherosclerotic heart disease of native coronary artery without angina pectoris: Secondary | ICD-10-CM

## 2020-02-27 DIAGNOSIS — I480 Paroxysmal atrial fibrillation: Secondary | ICD-10-CM

## 2020-02-27 DIAGNOSIS — G4733 Obstructive sleep apnea (adult) (pediatric): Secondary | ICD-10-CM | POA: Diagnosis not present

## 2020-02-27 DIAGNOSIS — I10 Essential (primary) hypertension: Secondary | ICD-10-CM | POA: Diagnosis not present

## 2020-02-27 DIAGNOSIS — R55 Syncope and collapse: Secondary | ICD-10-CM

## 2020-02-27 HISTORY — PX: OTHER SURGICAL HISTORY: SHX169

## 2020-02-27 NOTE — Patient Instructions (Addendum)
Medication Instructions:  Your physician recommends that you continue on your current medications as directed. Please refer to the Current Medication list given to you today.  Labwork: None ordered.  Testing/Procedures: None ordered.  Follow-Up:  Your physician wants you to follow-up in: 3 months with Dr. Rayann Heman.    May 19, 2020 at 2:45 pm in office    Any Other Special Instructions Will Be Listed Below (If Applicable).  If you need a refill on your cardiac medications before your next appointment, please call your pharmacy.    Implantable Loop Recorder Placement, Care After This sheet gives you information about how to care for yourself after your procedure. Your health care provider may also give you more specific instructions. If you have problems or questions, contact your health care provider. What can I expect after the procedure? After the procedure, it is common to have:  Soreness or discomfort near the incision.  Some swelling or bruising near the incision. Follow these instructions at home: Incision care   Follow instructions from your health care provider about how to take care of your incision. Make sure you: ? Leave your outer dressing on for 24 hours.  After 24 hours you can remove that and shower. ? Leave adhesive strips in place. These skin closures may need to stay in place for 2 weeks or longer. If adhesive strip edges start to loosen and curl up, you may trim the loose edges. Do not remove adhesive strips completely unless your health care provider tells you to do that.  Check your incision area every day for signs of infection. Check for: ? Redness, swelling, or pain. ? Fluid or blood. ? Warmth. ? Pus or a bad smell. Do not take baths, swim, or use a hot tub until your incision is completely healed.  Activity  Return to your normal activities.  General instructions  Follow instructions from your health care provider about how to manage your  implantable loop recorder and transmit the information. Learn how to activate a recording if this is necessary for your type of device.  Do not go through a metal detection gate, and do not let someone hold a metal detector over your chest. Show your ID card.  Do not have an MRI unless you check with your health care provider first.  Take over-the-counter and prescription medicines only as told by your health care provider.  Keep all follow-up visits as told by your health care provider. This is important. Contact a health care provider if:  You have redness, swelling, or pain around your incision.  You have a fever.  You have pain that is not relieved by your pain medicine.  You have triggered your device because of fainting (syncope) or because of a heartbeat that feels like it is racing, slow, fluttering, or skipping (palpitations). Get help right away if you have:  Chest pain.  Difficulty breathing. Summary  After the procedure, it is common to have soreness or discomfort near the incision.  Change your dressing as told by your health care provider.  Follow instructions from your health care provider about how to manage your implantable loop recorder and transmit the information.  Keep all follow-up visits as told by your health care provider. This is important. This information is not intended to replace advice given to you by your health care provider. Make sure you discuss any questions you have with your health care provider. Document Released: 09/01/2015 Document Revised: 11/05/2017 Document Reviewed: 11/05/2017 Elsevier Patient  Education  [employer].

## 2020-02-27 NOTE — Progress Notes (Signed)
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  Since last being seen in our clinic, the patient reports doing very well.  Today, he denies symptoms of palpitations, chest pain, shortness of breath,  lower extremity edema, dizziness, presyncope, or syncope.  The patient is otherwise without complaint today.   Past Medical History:  Diagnosis Date  . Arthritis    "back, right knee, hands, ankles, neck" (05/31/2016)  . Carotid artery disease ([place]) 08/30/2016   Carotid US 11/19: R 1-39, L 40-59 // Carotid US 08/2019: R 1-39; L 40-59; repeat 1 year  . Chronic lower back pain   . Coronary atherosclerosis of native coronary artery    a. BMS to [facility] 2004 and 2007, otherwise mild nonobstructive disease. EF normal.  . Diverticulitis   . Dyslipidemia   . Essential hypertension, benign   . GERD (gastroesophageal reflux disease)   . Lumbar radiculopathy, chronic 02/04/2015   Right L5  . Obesity   . OSA on CPAP   . Paroxysmal atrial fibrillation ([place])    a. Discovered after stroke.  . Pneumonia 01/1996  . PONV (postoperative nausea and vomiting)   . Prostate CA ([place])   . Recurrent upper respiratory infection (URI)   . Visit for monitoring Tikosyn therapy 09/18/2019   Past Surgical History:  Procedure Laterality Date  . ANTERIOR CERVICAL DECOMP/DISCECTOMY FUSION  07/2001; 10/2002   "C5-6; C6-7; redo"  . BACK SURGERY    . CARPAL TUNNEL RELEASE Left 10/2015  . COLONOSCOPY W/ POLYPECTOMY  02/2014  . CORONARY ANGIOPLASTY WITH STENT PLACEMENT  05/2003; 12/2005   "mid RCA; mid RCA"  . HAND SURGERY  02/2019   LEFT HAND  . JOINT REPLACEMENT    . KNEE ARTHROSCOPY Left 10/2005  . KNEE ARTHROSCOPY W/ PARTIAL MEDIAL MENISCECTOMY Left 09/2005  . LUMBAR LAMINECTOMY/DECOMPRESSION MICRODISCECTOMY  03/2005   "L4-5"  . POSTERIOR LUMBAR FUSION  10/2003   L5-S1; "plates, screws"  . SHOULDER ARTHROSCOPY Right 08/2011   Debridement of labrum, arthroscopic distal clavicle excision  . SHOULDER OPEN ROTATOR CUFF REPAIR Left 07/2014  . TEE WITHOUT CARDIOVERSION  07/07/2012   Procedure: TRANSESOPHAGEAL ECHOCARDIOGRAM (TEE);  Surgeon: Fay Records, MD;  Location: [facility];  Service: Cardiovascular;  Laterality: N/A;  . TONSILLECTOMY AND ADENOIDECTOMY  ~ 1956  . TOTAL KNEE ARTHROPLASTY Left 10/2006  . TRIGGER FINGER RELEASE Left 10/2015    ROS- all systems are reviewed and negatives except as per HPI above  Current Outpatient Medications  Medication Sig Dispense Refill  . acetaminophen (TYLENOL 8 HOUR ARTHRITIS PAIN) 650 MG CR tablet Take 1,300 mg by mouth as needed for pain (pain).     Marland Kitchen ALPRAZolam (XANAX) 0.5 MG tablet Take 0.5 mg by mouth as needed for anxiety.   1  . atorvastatin (LIPITOR) 40 MG tablet Take 1 tablet (40 mg total) by mouth daily. 90 tablet 3  . clindamycin (CLEOCIN) 300 MG capsule clindamycin HCl 300 mg capsule  TAKE 2 CAPSULES BY MOUTH 1 HOUR PRIOR TO DENTAL TREATMENT    . diltiazem (CARDIZEM) 30 MG tablet Take 1 tablet (30 mg total) by mouth 4 (four) times daily as needed. 60 tablet 6  . docusate sodium (COLACE) 100 MG capsule Take 100 mg by mouth 2 (two) times daily.    Marland Kitchen dofetilide (TIKOSYN) 500 MCG capsule TAKE 1 CAPSULE BY MOUTH 2 TIMES DAILY. 108 capsule 3  .  fenofibrate 160 MG tablet TAKE 1 TABLET BY MOUTH EVERY DAY 90 tablet 3  . finasteride (PROSCAR) 5 MG tablet Take 5 mg by mouth daily.     . fluticasone (FLONASE) 50 MCG/ACT nasal spray Place 2 sprays into the nose daily.     Marland Kitchen levocetirizine (XYZAL) 5 MG tablet Take 5 mg by mouth every evening.    Marland Kitchen lisinopril (ZESTRIL) 10 MG tablet Take 1 tablet (10 mg total) by mouth daily. 90 tablet 3  . magnesium oxide (MAG-OX) 400 MG tablet Take 1 tablet (400 mg total) by mouth daily. 90 tablet 3  . metoprolol succinate (TOPROL-XL) 50 MG 24 hr tablet Take 1 tablet (50 mg total) by mouth daily. Take with or immediately following a meal. 90  tablet 3  . Multiple Vitamins-Minerals (MULTIVITAMINS THER. W/MINERALS) TABS Take 1 tablet by mouth daily.      . nitroGLYCERIN (NITROSTAT) 0.4 MG SL tablet Place 1 tablet (0.4 mg total) under the tongue every 5 (five) minutes as needed for chest pain. 25 tablet 5  . omeprazole (PRILOSEC) 10 MG capsule Take 1 capsule (10 mg total) by mouth daily. 90 capsule 3  . potassium chloride (K-DUR) 10 MEQ tablet Take 1 tablet (10 mEq total) by mouth daily. 90 tablet 3  . saccharomyces boulardii (FLORASTOR) 250 MG capsule Take 250 mg by mouth daily. [facility]    . sildenafil (REVATIO) 20 MG tablet Take 2 to 5 tabs daily as needed for erectile dysfunction 30 tablet 2  . Tamsulosin HCl (FLOMAX) 0.4 MG CAPS Take 0.4 mg by mouth daily.      Marland Kitchen topiramate (TOPAMAX) 50 MG tablet Take 2 tablets (100 mg total) by mouth 2 (two) times daily. 360 tablet 3  . XARELTO 20 MG TABS tablet TAKE 1 TABLET BY MOUTH EVERY DAY 90 tablet 3   No current facility-administered medications for this visit.    Physical Exam: Vitals:   02/27/20 0911  BP: 132/68  Pulse: (!) 59  SpO2: 94%  Weight: 271 lb (122.9 kg)  Height: 6' 1.5" (1.867 m)    GEN- The patient is well appearing, alert and oriented x 3 today.   Head- normocephalic, atraumatic Eyes-  Sclera clear, conjunctiva pink Ears- hearing intact Oropharynx- clear Lungs-  normal work of breathing Heart- Regular rate and rhythm  GI- soft, NT, ND, + BS Extremities- no clubbing, cyanosis, or edema  Wt Readings from Last 3 Encounters:  02/27/20 271 lb (122.9 kg)  02/11/20 270 lb (122.5 kg)  02/01/20 268 lb (121.6 kg)    EKG tracing ordered today is personally reviewed and shows sinus  Assessment and Plan:  1. Presyncope Worrisome for an arrhythmogenic cause.  Infrequent events would not likely be found on 30 day monitor. I would therefore advise implantation of an implantable loop recorder for long term arrhythmia monitoring.  Risks and benefits to ILR were  discussed at length with the patient today, including but not limited to risks of bleeding and infection.  Extensive device education was performed.  Remote monitoring was also discussed at length today.  The patient understands and wishes to proceed.  We will proceed at this time with ILR implantation.  2. Paroxysmal atrial fibrillation Doing well with tikosyn,  qtc 463 msec today Labs 01/30/20 reviewed The importance of close follow-up to avoid toxicity with Phyllis Ginger is considered today  3. OSA Complaint with CPAP  4. Hypertensive cardiovascular disease Stable No change required today  5. Overweight Lifestyle modification is advised  6. CAD No ischemic symptoms  Thompson Grayer MD, [facility] 02/27/2020 9:53 AM      DESCRIPTION OF PROCEDURE:  Informed written consent was obtained.  The patient required no sedation for the procedure today.  The patients left chest was prepped and draped. Mapping over the patient's chest was performed to identify the appropriate ILR site.  This area was found to be the left parasternal region over the 3rd-4th intercostal space.  The skin overlying this region was infiltrated with lidocaine for local analgesia.  A 0.5-cm incision was made at the implant site.  A subcutaneous ILR pocket was fashioned using a combination of sharp and blunt dissection.  A Medtronic Reveal Linq model [id number]  implantable loop recorder was then placed into the pocket R waves were very prominent and measured > 0.2 mV. EBL<1 ml.  Steri- Strips and a sterile dressing were then applied.  There were no early apparent complications.     CONCLUSIONS:   1. Successful implantation of a Medtronic Reveal LINQ implantable loop recorder for presyncope and afib management  2. No early apparent complications.   Thompson Grayer MD, [facility] 02/27/2020 9:53 AM

## 2020-02-29 NOTE — Progress Notes (Signed)
GU Location of Tumor / Histology: prostatic adenocarcinoma  If Prostate Cancer, Gleason Score is (4 + 5) and PSA is (4.79). Prostate volume: 26 grams.  Corey [name] reports that since the age of 74 he has had yearly PSA checks. He explains that 3 years ago after a routine check that his PSA was ticking up however the last two years his PSA was stable. Patient goes onto explain that this last year during his physical exam it was noted that his PSA had shot up thus a prostate biopsy was done.     Patient's brother, Corey [name], was recently treated by Dr. Tammi [name] for prostate cancer. The brothers look very much alike.   Biopsies of prostate (if applicable) revealed:     Past/Anticipated interventions by urology, if any: Prescribed finasteride, tamsulosin, sildenafil, prostate biopsy, bone scan (negative), CT scan (negative), referral to Dr. Tammi [name] for consideration of radiotherapy  Past/Anticipated interventions by medical oncology, if any: no  Weight changes, if any: denies  Bowel/Bladder complaints, if any: IPSS 3. SHIM 17 with aid of medication. Reports occasional dysuria. Denies hematuria. Denies urinary leakage or incontinence. Denies any bowel complaints.    Nausea/Vomiting, if any: denies  Pain issues, if any:  denies  SAFETY ISSUES:  Prior radiation? denies  Pacemaker/ICD? LOOP RECORDER. DR. Rayann [name]  Possible current pregnancy? no, male patient  Is the patient on methotrexate? denies  Current Complaints / other details:  74 year old male. Retired Architectural technologist. Married with one daughter and 2  sons. Stopped smoking in 2004 after 14 years of smoking one ppd.

## 2020-03-04 ENCOUNTER — Ambulatory Visit
Admission: RE | Admit: 2020-03-04 | Discharge: 2020-03-04 | Disposition: A | Discharge status: Home / Self Care | Payer: Medicare PPO | Source: Ambulatory Visit | Attending: Radiation Oncology | Admitting: Radiation Oncology

## 2020-03-04 ENCOUNTER — Other Ambulatory Visit: Payer: Self-pay

## 2020-03-04 ENCOUNTER — Encounter: Payer: Self-pay | Admitting: Radiation Oncology

## 2020-03-04 VITALS — Ht 73.5 in | Wt 267.0 lb

## 2020-03-04 DIAGNOSIS — C61 Malignant neoplasm of prostate: Secondary | ICD-10-CM | POA: Insufficient documentation

## 2020-03-04 NOTE — Progress Notes (Signed)
Radiation Oncology         [phone] ________________________________  Initial Outpatient Consultation - Conducted via MyChart due to current COVID-19 concerns for limiting patient exposure  Name: Corey [name] MRN: [id number]  Date: 03/04/2020  DOB: [birth year]  VQ:[id number], [place], MD  Festus Aloe, MD   REFERRING PHYSICIAN: Festus Aloe, MD  DIAGNOSIS: 74 y.o. 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    ICD-10-CM   1. Malignant neoplasm of prostate ([place])  C61     HISTORY OF PRESENT ILLNESS: Corey [name] is a 74 y.o. male with a diagnosis of prostate cancer. He has been followed by Dr. Junious Silk on and off for BPH with BOO since 2012- managed with flomax. He was noted to have an elevated PSA of 4.54 in 02/2016 and a repeat PSA in 04/2016 was 3.58.  Digital rectal examination was performed at that time revealing right-sided firmness but he declined prostate MRI and biopsy. A repeat PSA on 09/02/2016 was stable at 4.06, and he was started on finasteride shortly thereafter.  His PSA has continued to be monitored closely and had remained stable in the 2.3-2.5 range (4.6-5 range, corrected for finasteride).  However, at his recent follow up on 12/13/2019, he was noted to have a new 10 mm left mid gland nodule on DRE, in addition to the stable right-sided firmness and the PSA had increased to 4.79 (approximately 9.6 when corrected for finasteride). The patient proceeded to transrectal ultrasound with 12 biopsies of the prostate on 01/22/2020.  The prostate volume measured 26.36 cc.  Out of 12 core biopsies, 9 were positive.  The maximum Gleason score was 4+5, and this was seen in the left base lateral, left mid lateral and left apex lateral.  Additionally, Gleason 4+3 was seen in all six right-sided cores, all with PNI as well.  He underwent CT pelvis imaging on 02/18/2020 showing a grossly unremarkable  prostate without evidence of metastatic disease and no focal osseous lesions.  A bone scan performed the same day was negative for osseous metastatic disease.  Of note, we recently treated his brother Corey [name] here for prostate cancer as well.  The patient reviewed the biopsy results with his urologist and he has kindly been referred today for discussion of potential radiation treatment options.   PREVIOUS RADIATION THERAPY: No  PAST MEDICAL HISTORY:  Past Medical History:  Diagnosis Date  . Arthritis    "back, right knee, hands, ankles, neck" (05/31/2016)  . Carotid artery disease ([place]) 08/30/2016   Carotid US 11/19: R 1-39, L 40-59 // Carotid US 08/2019: R 1-39; L 40-59; repeat 1 year  . Chronic lower back pain   . Coronary atherosclerosis of native coronary artery    a. BMS to [facility] 2004 and 2007, otherwise mild nonobstructive disease. EF normal.  . Diverticulitis   . Dyslipidemia   . Essential hypertension, benign   . GERD (gastroesophageal reflux disease)   . Lumbar radiculopathy, chronic 02/04/2015   Right L5  . Obesity   . OSA on CPAP   . Paroxysmal atrial fibrillation ([place])    a. Discovered after stroke.  . Pneumonia 01/1996  . PONV (postoperative nausea and vomiting)   . Prostate CA ([place])   . Recurrent upper respiratory infection (URI)   . Visit for monitoring Tikosyn therapy 09/18/2019      PAST SURGICAL HISTORY: Past Surgical History:  Procedure Laterality Date  .  ANTERIOR CERVICAL DECOMP/DISCECTOMY FUSION  07/2001; 10/2002   "C5-6; C6-7; redo"  . BACK SURGERY    . CARPAL TUNNEL RELEASE Left 10/2015  . COLONOSCOPY W/ POLYPECTOMY  02/2014  . CORONARY ANGIOPLASTY WITH STENT PLACEMENT  05/2003; 12/2005   "mid RCA; mid RCA"  . HAND SURGERY  02/2019   LEFT HAND  . implantable loop recorder placement  02/27/2020   Medtronic Reveal [place] model [id number]  implantable loop recorder implanted by Dr Rayann Heman for afib management and evaluation of presyncope  . JOINT  REPLACEMENT    . KNEE ARTHROSCOPY Left 10/2005  . KNEE ARTHROSCOPY W/ PARTIAL MEDIAL MENISCECTOMY Left 09/2005  . LUMBAR LAMINECTOMY/DECOMPRESSION MICRODISCECTOMY  03/2005   "L4-5"  . POSTERIOR LUMBAR FUSION  10/2003   L5-S1; "plates, screws"  . SHOULDER ARTHROSCOPY Right 08/2011   Debridement of labrum, arthroscopic distal clavicle excision  . SHOULDER OPEN ROTATOR CUFF REPAIR Left 07/2014  . TEE WITHOUT CARDIOVERSION  07/07/2012   Procedure: TRANSESOPHAGEAL ECHOCARDIOGRAM (TEE);  Surgeon: Fay Records, MD;  Location: [place];  Service: Cardiovascular;  Laterality: N/A;  . TONSILLECTOMY AND ADENOIDECTOMY  ~ 1956  . TOTAL KNEE ARTHROPLASTY Left 10/2006  . TRIGGER FINGER RELEASE Left 10/2015    FAMILY HISTORY:  Family History  Problem Relation Age of Onset  . Hypertension Mother   . Heart disease Father   . Heart attack Father   . Prostate cancer Brother   . Breast cancer Neg Hx   . Colon cancer Neg Hx   . Pancreatic cancer Neg Hx     SOCIAL HISTORY:  Social History   Socioeconomic History  . Marital status: Married    Spouse name: Corey [name]  . Number of children: 3  . Years of education: 65  . Highest education level: Not on file  Occupational History  . Occupation: Retired    Fish farm manager: DISABLED     Comment: Works at [employer] part time  Tobacco Use  . Smoking status: Former Smoker    Packs/day: 1.00    Years: 34.00    Pack years: 34.00    Types: Cigarettes    Quit date: 05/05/2003    Years since quitting: 16.8  . Smokeless tobacco: Never Used  Substance and Sexual Activity  . Alcohol use: No    Alcohol/week: 0.0 standard drinks  . Drug use: No  . Sexual activity: Not Currently  Other Topics Concern  . Not on file  Social History Narrative   Patient lives at home with spouse.   Caffeine Use:    Social Determinants of Health   Financial Resource Strain:   . Difficulty of Paying Living Expenses:   Food Insecurity:   . Worried About Charity fundraiser in  the Last Year:   . Arboriculturist in the Last Year:   Transportation Needs:   . Film/video editor (Medical):   Marland Kitchen Lack of Transportation (Non-Medical):   Physical Activity:   . Days of Exercise per Week:   . Minutes of Exercise per Session:   Stress:   . Feeling of Stress :   Social Connections:   . Frequency of Communication with Friends and Family:   . Frequency of Social Gatherings with Friends and Family:   . Attends Religious Services:   . Active Member of Clubs or Organizations:   . Attends Archivist Meetings:   Marland Kitchen Marital Status:   Intimate Partner Violence:   . Fear of Current or Ex-Partner:   .  Emotionally Abused:   Marland Kitchen Physically Abused:   . Sexually Abused:     ALLERGIES: Amoxicillin, Fosinopril, Ampicillin, Codeine, Monopril [fosinopril sodium], Dilaudid [hydromorphone hcl], and Percocet [oxycodone-acetaminophen]  MEDICATIONS:  Current Outpatient Medications  Medication Sig Dispense Refill  . acetaminophen (TYLENOL 8 HOUR ARTHRITIS PAIN) 650 MG CR tablet Take 1,300 mg by mouth as needed for pain (pain).     Marland Kitchen ALPRAZolam (XANAX) 0.5 MG tablet Take 0.5 mg by mouth as needed for anxiety.   1  . atorvastatin (LIPITOR) 40 MG tablet Take 1 tablet (40 mg total) by mouth daily. 90 tablet 3  . dofetilide (TIKOSYN) 500 MCG capsule TAKE 1 CAPSULE BY MOUTH 2 TIMES DAILY. 108 capsule 3  . fenofibrate 160 MG tablet TAKE 1 TABLET BY MOUTH EVERY DAY 90 tablet 3  . finasteride (PROSCAR) 5 MG tablet Take 5 mg by mouth daily.     . fluticasone (FLONASE) 50 MCG/ACT nasal spray Place 2 sprays into the nose daily.     Marland Kitchen levocetirizine (XYZAL) 5 MG tablet Take 5 mg by mouth every evening.    Marland Kitchen lisinopril (ZESTRIL) 10 MG tablet Take 1 tablet (10 mg total) by mouth daily. 90 tablet 3  . magnesium oxide (MAG-OX) 400 MG tablet Take 1 tablet (400 mg total) by mouth daily. 90 tablet 3  . metoprolol succinate (TOPROL-XL) 50 MG 24 hr tablet Take 1 tablet (50 mg total) by mouth daily.  Take with or immediately following a meal. 90 tablet 3  . Multiple Vitamins-Minerals (MULTIVITAMINS THER. W/MINERALS) TABS Take 1 tablet by mouth daily.      Marland Kitchen omeprazole (PRILOSEC) 10 MG capsule Take 1 capsule (10 mg total) by mouth daily. 90 capsule 3  . potassium chloride (K-DUR) 10 MEQ tablet Take 1 tablet (10 mEq total) by mouth daily. 90 tablet 3  . sildenafil (REVATIO) 20 MG tablet Take 2 to 5 tabs daily as needed for erectile dysfunction 30 tablet 2  . Tamsulosin HCl (FLOMAX) 0.4 MG CAPS Take 0.4 mg by mouth daily.      Marland Kitchen topiramate (TOPAMAX) 50 MG tablet Take 2 tablets (100 mg total) by mouth 2 (two) times daily. 360 tablet 3  . XARELTO 20 MG TABS tablet TAKE 1 TABLET BY MOUTH EVERY DAY 90 tablet 3  . clindamycin (CLEOCIN) 300 MG capsule clindamycin HCl 300 mg capsule  TAKE 2 CAPSULES BY MOUTH 1 HOUR PRIOR TO DENTAL TREATMENT    . diltiazem (CARDIZEM) 30 MG tablet Take 1 tablet (30 mg total) by mouth 4 (four) times daily as needed. (Patient not taking: Reported on 03/04/2020) 60 tablet 6  . docusate sodium (COLACE) 100 MG capsule Take 100 mg by mouth 2 (two) times daily.    . nitroGLYCERIN (NITROSTAT) 0.4 MG SL tablet Place 1 tablet (0.4 mg total) under the tongue every 5 (five) minutes as needed for chest pain. (Patient not taking: Reported on 03/04/2020) 25 tablet 5  . saccharomyces boulardii (FLORASTOR) 250 MG capsule Take 100 mg by mouth daily. [facility]     No current facility-administered medications for this encounter.    REVIEW OF SYSTEMS:  On review of systems, the patient reports that he is doing well overall. He denies any chest pain, shortness of breath, cough, fevers, chills, night sweats, unintended weight changes. He denies any bowel disturbances, and denies abdominal pain, nausea or vomiting. He denies any new musculoskeletal or joint aches or pains. His IPSS was 3, indicating controlled urinary symptoms on Flomax and finasteride daily.  He reports occasional dysuria. His SHIM  was 17 with aid of medication, indicating he has moderately controlled erectile dysfunction. A complete review of systems is obtained and is otherwise negative.    PHYSICAL EXAM:  Wt Readings from Last 3 Encounters:  03/04/20 267 lb (121.1 kg)  02/27/20 271 lb (122.9 kg)  02/11/20 270 lb (122.5 kg)   Temp Readings from Last 3 Encounters:  02/11/20 97.7 F (36.5 C)  01/16/20 (!) 97.2 F (36.2 C)  10/10/19 97.7 F (36.5 C) (Oral)   BP Readings from Last 3 Encounters:  02/27/20 132/68  02/11/20 136/76  02/01/20 131/61   Pulse Readings from Last 3 Encounters:  02/27/20 (!) 59  02/11/20 60  02/01/20 62   Pain Assessment Pain Score: 0-No pain/10  In general this is a well appearing Caucasian gentleman in no acute distress. He's alert and oriented x4 and appropriate throughout the examination. Cardiopulmonary assessment is negative for acute distress and he exhibits normal effort.   KPS = 90  100 - Normal; no complaints; no evidence of disease. 90   - Able to carry on normal activity; minor signs or symptoms of disease. 80   - Normal activity with effort; some signs or symptoms of disease. 21   - Cares for self; unable to carry on normal activity or to do active work. 60   - Requires occasional assistance, but is able to care for most of his personal needs. 50   - Requires considerable assistance and frequent medical care. 56   - Disabled; requires special care and assistance. 76   - Severely disabled; hospital admission is indicated although death not imminent. 66   - Very sick; hospital admission necessary; active supportive treatment necessary. 10   - Moribund; fatal processes progressing rapidly. 0     - Dead  Karnofsky DA, Abelmann [place], Craver LS and Burchenal [facility] [phone]) The use of the nitrogen mustards in the palliative treatment of carcinoma: with particular reference to bronchogenic carcinoma Cancer 1 634-56  LABORATORY DATA:  Lab Results  Component Value Date   WBC  6.9 09/07/2019   HGB 14.8 09/07/2019   HCT 44.8 09/07/2019   MCV 93.1 09/07/2019   PLT 265 09/07/2019   Lab Results  Component Value Date   NA 138 01/30/2020   K 4.7 01/30/2020   CL 106 01/30/2020   CO2 24 01/30/2020   Lab Results  Component Value Date   ALT 37 09/20/2018   AST 29 09/20/2018   ALKPHOS 59 09/20/2018   BILITOT 0.5 09/20/2018     RADIOGRAPHY: NM Bone Scan Whole Body  Result Date: 02/19/2020 CLINICAL DATA:  Prostate cancer, rising PSA EXAM: NUCLEAR MEDICINE WHOLE BODY BONE SCAN TECHNIQUE: Whole body anterior and posterior images were obtained approximately 3 hours after intravenous injection of radiopharmaceutical. RADIOPHARMACEUTICALS:  21.4 mCi Technetium-64m MDP IV COMPARISON:  None. FINDINGS: Minimal degenerative uptake in the spine, sacroiliac joints, sternoclavicular joints, wrists, right knee, and first metatarsophalangeal joints. Photopenia of the left knee secondary to arthroplasty. No suspicious osseous uptake. Expected soft tissue uptake and urinary tract excretion. IMPRESSION: No scintigraphic evidence of osseous metastatic disease. Electronically Signed   By: Eddie Candle M.D.   On: 02/19/2020 08:13      IMPRESSION/PLAN: This visit was conducted via MyChart to spare the patient unnecessary potential exposure in the healthcare setting during the current COVID-19 pandemic. 1. 74 y.o. 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  We discussed the patient's workup and outlined the nature of prostate cancer in this setting. The patient's T stage, Gleason's score, and PSA put him into the high risk group. Accordingly, he is eligible for a variety of potential treatment options including prostatectomy or LT-ADT in combination with either 8 weeks of external radiation or 5 weeks of external radiation preceded by a brachytherapy boost. We discussed the available radiation techniques, and  focused on the details and logistics of delivery. We discussed and outlined the risks, benefits, short and long-term effects associated with radiotherapy and compared and contrasted these with prostatectomy. We discussed the role of SpaceOAR in reducing the rectal toxicity associated with radiotherapy. We also detailed the role of ADT in the treatment of high risk prostate cancer and outlined the associated side effects that could be expected with this therapy.  He and his wife, Corey [name], were encouraged to ask questions that were answered to their stated satisfaction.  At the end of the conversation, the patient is interested in moving forward with 8 weeks of external beam therapy, concurrent with LT-ADT. He has not received his first ADT injection. We will share our discussion with Dr. Junious Silk and make arrangements for start of ADT now, followed by placement of fiducial markers and SpaceOAR gel, prior to simulation in August 2021, to reduce rectal toxicity from radiotherapy. The patient appears to have a good understanding of his disease and our treatment recommendations which are of curative intent and is in agreement with the stated plan. Therefore, we will move forward with treatment planning accordingly, in anticipation of beginning IMRT in mid August 2021, approximately 2 months after starting ADT.   Given current concerns for patient exposure during the COVID-19 pandemic, this encounter was conducted via video-enabled MyChart visit. The patient has given verbal consent for this type of encounter. The time spent during this encounter was 60 minutes. The attendants for this meeting include Tyler Pita MD, Ashlyn Bruning PA-C, [place], and patient Corey [name] and his wife. During the encounter, Tyler Pita MD, Ashlyn Bruning PA-C, and scribe, Wilburn Mylar were located at [place].  Patient Corey [name] and his wife were located  at home.    Nicholos Johns, PA-C    Tyler Pita, MD  [facility] Direct Dial: [phone]  Fax: [phone] [email or web address]  Skype  [email or web address]  This document serves as a record of services personally performed by Tyler Pita, MD and Freeman Caldron, PA-C. It was created on their behalf by Wilburn Mylar, a trained medical scribe. The creation of this record is based on the scribe's personal observations and the provider's statements to them. This document has been checked and approved by the attending provider.

## 2020-03-10 ENCOUNTER — Telehealth: Payer: Self-pay | Admitting: *Deleted

## 2020-03-10 NOTE — Telephone Encounter (Signed)
CALLED PATIENT TO INFORM OF ADT APPT. FOR 03-11-20 - ARRIVAL TIME- 10:15 AM, SPOKE WITH PATIENT AND HE IS AWARE OF THIS APPT.

## 2020-03-11 ENCOUNTER — Ambulatory Visit (INDEPENDENT_AMBULATORY_CARE_PROVIDER_SITE_OTHER): Payer: Medicare PPO | Admitting: Neurology

## 2020-03-11 DIAGNOSIS — G4733 Obstructive sleep apnea (adult) (pediatric): Secondary | ICD-10-CM

## 2020-03-11 DIAGNOSIS — I6340 Cerebral infarction due to embolism of unspecified cerebral artery: Secondary | ICD-10-CM

## 2020-03-11 DIAGNOSIS — Z8673 Personal history of transient ischemic attack (TIA), and cerebral infarction without residual deficits: Secondary | ICD-10-CM

## 2020-03-11 DIAGNOSIS — I480 Paroxysmal atrial fibrillation: Secondary | ICD-10-CM

## 2020-03-11 DIAGNOSIS — Z9989 Dependence on other enabling machines and devices: Secondary | ICD-10-CM

## 2020-03-13 ENCOUNTER — Encounter: Payer: Self-pay | Admitting: Medical Oncology

## 2020-03-13 NOTE — Progress Notes (Signed)
Patient returned my call to confirm he received ADT 6/8. He states he is doing well without any side effect. I encourage him to call me with questions or concerns.

## 2020-03-13 NOTE — Progress Notes (Signed)
Left a brief message to introduce myself as the prostate nurse navigator. I asked him to return my call so I can further discuss my role.

## 2020-03-17 ENCOUNTER — Encounter: Payer: Self-pay | Admitting: Neurology

## 2020-03-17 ENCOUNTER — Ambulatory Visit: Payer: Medicare PPO | Admitting: Neurology

## 2020-03-17 VITALS — BP 131/71 | HR 74 | Ht 73.0 in | Wt 275.0 lb

## 2020-03-17 DIAGNOSIS — G250 Essential tremor: Secondary | ICD-10-CM

## 2020-03-17 NOTE — Progress Notes (Signed)
[name] [address] [place]. [name] [address] [phone]       OFFICE FOLLOW UP NOTE  [name] Date of Birth:  1946-03-18 Medical Record Number:  [id number]   Referring MD: Chriss Czar provider: Dr. Leonie Man  Reason for Referral: TIA and essential tremor   Chief complaint: Chief Complaint  Patient presents with  . Referral    Referral for TIA room 1  pt alone    HPI:   [name] is a 74 year old male who is being seen today, 01/16/2020, for 3 months follow-up in regards to prior strokelike symptoms possibly TIA and essential tremors.  He comes in today with multiple concerns.  He reports on April 5, he had another episode similar to what occurred in August but increased severity.  He reports driving and had sudden onset of feeling as though he was going to lose consciousness, feeling like his eyes wanted to continue to go back inside, blurred vision, difficulty staying awake and follows all hands were frozen on the steering wheel.  He was able to eventually exit highway and symptoms gradually started improving.  He believes episode lasted approximately 2 minutes but possibly longer.  He did feel fogginess immediately after for a couple minutes but that resolved.  He believes possibly mild headache but unable to say for sure.  He denies any dizziness sensation, lightheadedness, heart palpitations, shortness of breath or severe headache.  He also reports having episode in February but fortunately was not driving, symptoms were less severe and was able to grab onto the side of his truck for support and symptoms shortly resolved.  He was unable to monitor blood pressure at that time but routinely monitors at home and has been stable as well as stable today at 138/72.  He is concerned regarding possible seizure activity.  Essential tremors: He reports initial improvement after increasing Topamax dosage from 50 mg twice daily to 50mg  AM and 100 PM.  but  has been slowly worsening and interferes with daily activity.  He will also have difficulty at times with ambulation and stumbling.  He has tolerated dosage well without difficulty.  Obtain MRI to rule out underlying etiology causing tremors which was unremarkable for acute abnormality or concerning causes.  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  OSA on CPAP: He reports ongoing nightly compliance with CPAP but has not had a sleep study in "numerous years" and is overdue for a new machine.  Previously followed by [facility] but has had difficulty getting in contact with them for follow-up visit.  He reports fatigue which has been slowly worsening and insomnia.  History of stroke: History of left parietal stroke.  Continues on Xarelto for history of atrial fibrillation and secondary stroke prevention without bleeding or bruising.  Continues on atorvastatin without myalgias.  Blood pressure stable.  Denies known worsening stroke/TIA symptoms.  Vertigo: Reports increased vertigo/dizziness with rapid head movements and lasts only a few seconds and then resolves.  At times episodes can last longer in duration or be more severe.  He does report family history of vertigo.     History provided for reference purposes only Update 10/10/2019 JM: [name] is a 74 year old male who is being seen today for 47-month follow-up.  He has been stable from a stroke standpoint without any reoccurring  or new stroke/TIA symptoms.  He was initiated on Topamax at prior visit due to likely essential tremor.  He states he has seen some improvement with use of Topamax 50 mg twice daily but he will have episodes where his tremors will be worse with increased fatigue and energy towards the afternoon.  He also endorses worsening  with stressful events.  He has also been having difficulty with balance where he will stumble at times, kick the edge of the step when going upstairs and has difficulty making turns while ambulating.  This occurs more with his right foot.  He is overall very frustrated as he is concerned regarding possibly having Parkinson's disease as his brother has been recently diagnosed and he wants to ensure that he does not have Parkinson's.  He is frustrated as he has not underwent any type of imaging or testing to rule this out.  He continues on Xarelto for secondary stroke prevention history of atrial fibrillation without bleeding or bruising.  Continues on atorvastatin without myalgias.  Blood pressure today 135/73.  He continues to use CPAP for OSA management.  He continues to follow with cardiology regularly.  No concerns at this time.  Initial visit 06/25/2019 Dr. Leonie Man: [name] is a 74 year old Caucasian male with past medical history of hypertension, hyperlipidemia, coronary artery disease status post PTCA stenting, history of left MCA embolic infarct in 4627 due to atrial fibrillation, obstructive sleep apnea, arthritis, gastroesophageal reflux disease, remote smoking and obesity who is referred for evaluation for an episode of possible TIA.  History is obtained from the patient, review of electronic medical records and I personally reviewed imaging films in PACS.  Patient states that he was driving in [facility] on 06/02/2019 when all of a sudden he did not feel well.  He felt that he may pass out vision was getting blurred he was having a mild headache.  He managed to pull over the car and ask wife to take over.  He had a portable EKG monitor which showed that his pulse rate was 73 and rhythm was okay.  But his blood pressure was elevated at 195 95.  He waited about 5 minutes and retook the blood pressure it was still high at 185/95.  They decided to go over to the local ER where he had further  work-up including checking blood sugar which was 128 mg percent.  Patient at that time was also feeling cold and clammy and sweaty.  He was evaluated at [facility] where CT scan of the head as well as basic lab work were obtained which were all normal.  Patient states that he had somewhat similar episode before he had his previous stroke in 2013.  He has atrial fibrillation and has been taking Xarelto quite faithfully with his dinner every night without fail.  He states he had last lipid profile and A1c checked in December 2019 by his primary physician and they were satisfactory and he is plans to see him next week to have them repeated.  He does use his CPAP every night regularly.  He is currently on a low-carb diet and wants to lose some weight.  He did have a recent tele-visit with his cardiologist who added hydrochlorothiazide for blood pressure control and his blood pressure is doing better now running 035-009 systolic.  The patient has had longstanding history of tremors in his left hand probably 3 years or longer.  He is was diagnosed with essential tremor in fact  was referred by me to Dr. Rexene Alberts on 11/07/2017 for second opinion who also felt it was essential tremor rather than Parkinson's.  Patient did not tolerate primidone well and it made him feel loopy and stopped it.  He also complained of a lot of fatigue and tiredness on Inderal which was discontinued.  He states that he is noticed increasing tremor as well as some drooling.  His brother was recently diagnosed with atypical Parkinson's and patient wonders if he has the same.  Update 03/17/2020 :  He returns for follow-up after last visit with [facility] 2 months ago.  He states he had 1 more episode in mid May of brief sensation that he may pass out and felt lightheaded dizzy but he was able to do some deep breathing and that feeling of being out of focus subsided and did not pass out.  He did have an EEG done on 01/21/2020  which was normal.  Patient had a loop recorder inserted   recently but this episode occurred prior to loop recorder.  At last visit he was advised to increase the Topamax for his essential tremor but he did not do so as a friend of his advised him against since he had a bad side effects with higher dose.  Patient however feels the tremors are not bothersome and they are about the same and does not want to increase his medicines.  He remains on Xarelto for stroke prevention which is tolerating well but does complain of some bruising but no bleeding episodes.  He was diagnosed with prostate cancer in April and is presently on hormonal treatment and plans to start radiation treatment later.  He has no other complaints today                          :   ROS:   14 system review of systems is positive tremors and bruising only and all other systems negative  PMH:  Past Medical History:  Diagnosis Date  . Arthritis    "back, right knee, hands, ankles, neck" (05/31/2016)  . Carotid artery disease ([place]) 08/30/2016   Carotid US 11/19: R 1-39, L 40-59 // Carotid US 08/2019: R 1-39; L 40-59; repeat 1 year  . Chronic lower back pain   . Coronary atherosclerosis of native coronary artery    a. BMS to [facility] 2004 and 2007, otherwise mild nonobstructive disease. EF normal.  . Diverticulitis   . Dyslipidemia   . Essential hypertension, benign   . GERD (gastroesophageal reflux disease)   . Lumbar radiculopathy, chronic 02/04/2015   Right L5  . Obesity   . OSA on CPAP   . Paroxysmal atrial fibrillation ([place])    a. Discovered after stroke.  . Pneumonia 01/1996  . PONV (postoperative nausea and vomiting)   . Prostate CA ([place])   . Recurrent upper respiratory infection (URI)   . Visit for monitoring Tikosyn therapy 09/18/2019    Social History:  Social History   Socioeconomic History  . Marital status: Married    Spouse name: [name]  . Number of children: 3  . Years of education: 30  . Highest education  level: Not on file  Occupational History  . Occupation: Retired    Fish farm manager: DISABLED     Comment: Works at [employer] part time  Tobacco Use  . Smoking status: Former Smoker    Packs/day: 1.00    Years: 34.00    Pack years: 34.00  Types: Cigarettes    Quit date: 05/05/2003    Years since quitting: 16.8  . Smokeless tobacco: Never Used  Vaping Use  . Vaping Use: Never used  Substance and Sexual Activity  . Alcohol use: No    Alcohol/week: 0.0 standard drinks  . Drug use: No  . Sexual activity: Not Currently  Other Topics Concern  . Not on file  Social History Narrative   Patient lives at home with spouse.   Caffeine Use:    Social Determinants of Health   Financial Resource Strain:   . Difficulty of Paying Living Expenses:   Food Insecurity:   . Worried About Charity fundraiser in the Last Year:   . Arboriculturist in the Last Year:   Transportation Needs:   . Film/video editor (Medical):   Marland Kitchen Lack of Transportation (Non-Medical):   Physical Activity:   . Days of Exercise per Week:   . Minutes of Exercise per Session:   Stress:   . Feeling of Stress :   Social Connections:   . Frequency of Communication with Friends and Family:   . Frequency of Social Gatherings with Friends and Family:   . Attends Religious Services:   . Active Member of Clubs or Organizations:   . Attends Archivist Meetings:   Marland Kitchen Marital Status:   Intimate Partner Violence:   . Fear of Current or Ex-Partner:   . Emotionally Abused:   Marland Kitchen Physically Abused:   . Sexually Abused:     Medications:   Current Outpatient Medications on File Prior to Visit  Medication Sig Dispense Refill  . acetaminophen (TYLENOL 8 HOUR ARTHRITIS PAIN) 650 MG CR tablet Take 1,300 mg by mouth as needed for pain (pain).     Marland Kitchen ALPRAZolam (XANAX) 0.5 MG tablet Take 0.5 mg by mouth as needed for anxiety.   1  . atorvastatin (LIPITOR) 40 MG tablet Take 1 tablet (40 mg total) by mouth daily. 90 tablet 3   . clindamycin (CLEOCIN) 300 MG capsule clindamycin HCl 300 mg capsule  TAKE 2 CAPSULES BY MOUTH 1 HOUR PRIOR TO DENTAL TREATMENT    . diltiazem (CARDIZEM) 30 MG tablet Take 1 tablet (30 mg total) by mouth 4 (four) times daily as needed. 60 tablet 6  . docusate sodium (COLACE) 100 MG capsule Take 100 mg by mouth 2 (two) times daily.    Marland Kitchen dofetilide (TIKOSYN) 500 MCG capsule TAKE 1 CAPSULE BY MOUTH 2 TIMES DAILY. 108 capsule 3  . fenofibrate 160 MG tablet TAKE 1 TABLET BY MOUTH EVERY DAY 90 tablet 3  . finasteride (PROSCAR) 5 MG tablet Take 5 mg by mouth daily.     . fluticasone (FLONASE) 50 MCG/ACT nasal spray Place 2 sprays into the nose daily.     Marland Kitchen leuprolide, 6 Month, (ELIGARD) 45 MG injection Inject 45 mg into the skin every 6 (six) months.    . levocetirizine (XYZAL) 5 MG tablet Take 5 mg by mouth every evening.    Marland Kitchen lisinopril (ZESTRIL) 10 MG tablet Take 1 tablet (10 mg total) by mouth daily. 90 tablet 3  . magnesium oxide (MAG-OX) 400 MG tablet Take 1 tablet (400 mg total) by mouth daily. 90 tablet 3  . metoprolol succinate (TOPROL-XL) 50 MG 24 hr tablet Take 1 tablet (50 mg total) by mouth daily. Take with or immediately following a meal. 90 tablet 3  . Multiple Vitamins-Minerals (MULTIVITAMINS THER. W/MINERALS) TABS Take 1 tablet by mouth daily.      Marland Kitchen  nitroGLYCERIN (NITROSTAT) 0.4 MG SL tablet Place 1 tablet (0.4 mg total) under the tongue every 5 (five) minutes as needed for chest pain. 25 tablet 5  . omeprazole (PRILOSEC) 10 MG capsule Take 1 capsule (10 mg total) by mouth daily. 90 capsule 3  . potassium chloride (K-DUR) 10 MEQ tablet Take 1 tablet (10 mEq total) by mouth daily. 90 tablet 3  . saccharomyces boulardii (FLORASTOR) 250 MG capsule Take 100 mg by mouth daily. [facility]    . sildenafil (REVATIO) 20 MG tablet Take 2 to 5 tabs daily as needed for erectile dysfunction 30 tablet 2  . Tamsulosin HCl (FLOMAX) 0.4 MG CAPS Take 0.4 mg by mouth daily.      Marland Kitchen topiramate (TOPAMAX)  50 MG tablet Take 2 tablets (100 mg total) by mouth 2 (two) times daily. 360 tablet 3  . XARELTO 20 MG TABS tablet TAKE 1 TABLET BY MOUTH EVERY DAY 90 tablet 3   No current facility-administered medications on file prior to visit.    Allergies:   Allergies  Allergen Reactions  . Amoxicillin Swelling  . Fosinopril Other (See Comments)  . Ampicillin Rash  . Codeine Nausea Only and Other (See Comments)    Heavy amounts cause nausea  . Monopril [Fosinopril Sodium] Other (See Comments)    Muscle aches & pain  . Dilaudid [Hydromorphone Hcl] Nausea Only  . Percocet [Oxycodone-Acetaminophen] Nausea And Vomiting    OBJECTIVE:   Vitals:  Today's Vitals   03/17/20 1511  BP: 131/71  Pulse: 74  Weight: 275 lb (124.7 kg)  Height: 6\' 1"  (1.854 m)   Body mass index is 36.28 kg/m.   Physical exam:  General: Obese elderly Caucasian male,  , seated, in no evident distress Head: head normocephalic and atraumatic.   Neck: supple with no carotid or supraclavicular bruits Cardiovascular: regular rate and rhythm, no murmurs Musculoskeletal: no deformity Skin:  no rash/petichiae Vascular:  Normal pulses all extremities  Neurologic Exam Mental Status: Awake and fully alert. Normal speech and language. Oriented to place and time. Recent and remote memory intact. Attention span, concentration and fund of knowledge appropriate. Mood and affect appropriate.  Cranial Nerves: Pupils equal, briskly reactive to light.  Esotropia right eye.  Extraocular movements full without nystagmus. Visual fields full to confrontation. Hearing intact. Facial sensation intact. Face, tongue, palate moves normally and symmetrically.  Motor: Normal bulk and tone. Normal strength in all tested extremity muscles.  Mild fine action tremor L>R.  No tremors observed at rest.  Mild cogwheel rigidity at the left wrist upon activation only.  Glabellar tap is negative.  No decreased facial expression or bradykinesia. Sensory.:  intact to touch , pinprick , position and vibratory sensation.  Coordination: Rapid alternating movements normal in all extremities.  Finger-to-nose and heel-to-shin performed accurately bilaterally. Gait and Station: Arises from chair without difficulty. Stance is normal. Gait demonstrates normal stride length.  Difficulty performing heel, toe and tandem walk.  No gait festination or stooped posture.  Normal arm swing  Reflexes: 1+ RUE and RLE, diminished LUE and LLE. Toes downgoing.       ASSESSMENT: 74 year old Caucasian male with transient episode of presyncopal symptoms initially in August 2020 and with increased severity most recently 01/16/2020 (see HPI).  Patient had somewhat similar initial symptoms in October 2013 followed by speech difficulty and right-sided weakness and left MCA branch infarct from atrial fibrillation.  Vascular risk factors of obesity, hypertension, hyperlipidemia, atrial fibrillation, OSA on CPAP and cardiac disease.  He also has 3-year history of left>right upper extremity action tremor which appears to be more likely essential tremor   PLAN: I had a long discussion with the patient regarding his brief episodes of near syncope which remained of unclear significance but now that he is a loop recorder hopefully we can catch these episodes in the future and find out what they are.  Continue Xarelto for stroke prevention given history of A. fib and maintain aggressive risk factor modification with strict control of hypertension with blood pressure goal below 130/90, lipids with LDL cholesterol goal below 70 mg percent and diabetes with hemoglobin A1c goal below 6.5%.  Encouraged him to eat a healthy diet and to be active and exercise regularly and lose weight.  Continue Topamax and the current dose has here essential tremor remains well controlled and is not bothersome.  Greater than 50% time during this 30-minute follow-up visit was spent on counseling and coordination of care  about his TIA, essential tremor and vertigo and answering questions he will return for follow-up in the future in 6 months with Janett Billow my nurse practitioner call earlier if necessary.    Antony Contras, MD [facility] [address] [place], [place] [address]  Phone [phone] Fax [phone] Note: This document was prepared with digital dictation and possible smart phrase technology. Any transcriptional errors that result from this process are unintentional.

## 2020-03-17 NOTE — Patient Instructions (Signed)
I had a long discussion with the patient regarding his brief episodes of near syncope which remained of unclear significance but now that he is a loop recorder hopefully we can catch these episodes in the future and find out what they are.  Continue Xarelto for stroke prevention given history of A. fib and maintain aggressive risk factor modification with strict control of hypertension with blood pressure goal below 130/90, lipids with LDL cholesterol goal below 70 mg percent and diabetes with hemoglobin A1c goal below 6.5%.  Encouraged him to eat a healthy diet and to be active and exercise regularly and lose weight.  Continue Topamax and the current dose has here essential tremor remains well controlled and is not bothersome.  He will return for follow-up in the future in 6 months with Janett Billow my nurse practitioner call earlier if necessary.

## 2020-03-25 NOTE — Progress Notes (Signed)
1. Mild Obstructive Sleep Apnea (OSA) at AHI 10.4/h and non-REM AHI 10.5/h, supine position AHI was 13.3/h.  2. Primary coughing and Snoring.  RECOMMENDATIONS: He hates his current mask and is very unhappy with [employer], DME- and has no communication-   1. CPAP auto- titration would be optimal- I think we have not enough sleep time to get reliable data from this short sleep record. Please refit for a new interface !. (If there is a problem with Medicare I will suggest to repeat this as a HST for the patient, hopefully getting enough sleep that way).  2. Avoiding supine sleep position is an effective treatment.  3. Coughing and congestion influenced this sleep test- both need to be treated.

## 2020-03-25 NOTE — Addendum Note (Signed)
Addended by: Larey Seat on: 03/25/2020 04:42 PM   Modules accepted: Orders

## 2020-03-25 NOTE — Procedures (Signed)
PATIENT'S NAME:  Corey [name], Corey [name] DOB:      [birth year]      MR#:    [id number]     DATE OF RECORDING: 03/11/2020 REFERRING M.D.:  Antony Contras, MD Study Performed:   Baseline Polysomnogram HISTORY:  74 year-old Caucasian male patient was seen upon referral on 02/11/2020 for a re-evaluation of OSA care- he has been followed by pulmonology.  Dr Leonie Man and Stroke NP have seen the patient just on 4-14- and 4-19 for TIA work up and deferred to Dr. Rexene Alberts for Tremor and to me for OSA (?).    Chief concern according to patient:  Mr. Crago reports that he was just recently diagnosed with prostate cancer after a biopsy showed 12 out of 12 needle biopsies positive for malignancy.  He has been followed for rising PSA over a year.  He is referred however by my stroke specialist colleague Dr. Leonie Man who was concerned that the patient has reported increasing fatigue and sleepiness in the daytime.  He has also followed with nurse practitioner for history of essential tremors.   The patient reports ongoing nightly compliance with CPAP and has not a sleep study in probably 15 years or longer.  His current machine is 74 years old he does have documentation of his excellent compliance of 100% over the last 30 days by days and time.  Average use at time of 7 hours 32 minutes is using an air sense 10 AutoSet with a pressure window between 5 and 13 cmH2O 1 cm expiratory pressure relief, residual AHI 1.6/h of sleep no central apneas are emerging.    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  He has been taking Xarelto at dinnertime, atrial fibrillation has been PD.  He had seen Dr. Rexene Alberts for a second  opinion, who also felt that he had an Essential Tremor not Parkinson's disease The patient endorsed the Epworth Sleepiness Scale at 8 points.   The patient's weight 269 pounds with a height of 73 (inches), resulting in a BMI of 35.6 kg/m2. The patient's neck circumference measured 18.5 inches.  CURRENT MEDICATIONS: Tylenol, Xanax, Lipitor, Cleocin, Cardizem, Colace, Tikosyn, Proscar, Flonase, Xyzal, Zestril, Toprol, Nitrostat, Prilosec, Florastor, Revatio, Flomax, Topamax, Xarelto   PROCEDURE:  This is a multichannel digital polysomnogram utilizing the Somnostar 11.2 system.  Electrodes and sensors were applied and monitored per AASM Specifications.   EEG, EOG, Chin and Limb EMG, were sampled at 200 Hz.  ECG, Snore and Nasal Pressure, Thermal Airflow, Respiratory Effort, CPAP Flow and Pressure, Oximetry was sampled at 50 Hz. Digital video and audio were recorded.      BASELINE STUDY: Lights Out was at 22:54 and Lights On at 04:59.  Total recording time (TRT) was 365.5 minutes, with a total sleep time (TST) of 142.5 minutes.   The patient's sleep latency was 204 minutes.  REM latency was 318 minutes.  The sleep efficiency was 39.7 %.     SLEEP ARCHITECTURE: WASO (Wake after sleep onset) was 192 minutes.  There were 47.5 minutes in Stage N1, 73 minutes Stage N2, 14 minutes Stage N3 and 8 minutes in Stage REM.  The percentage of Stage N1 was 33.3%, Stage  N2 was 51.2%, Stage N3 was 9.8% and Stage R (REM sleep) was 5.6%.   RESPIRATORY ANALYSIS:  There were a total of 25 respiratory events:  1 obstructive apnea, 0 central apneas and 0 mixed apneas with a total of 1 apnea and 24 hypopneas. Snoring was noted. The total APNEA/HYPOPNEA INDEX (AHI) was 10.4 /hour and the total RESPIRATORY DISTURBANCE INDEX was 10.4 /hour.  1 events occurred in REM sleep and 46 events in NREM. The REM AHI was 7.5, /hour versus a non-REM AHI of 10.5 /hour. The patient spent 4.5 minutes sleep time in the supine position 140 minutes  in non-supine. The supine AHI was 13.3 /hour versus a non-supine AHI of 10.3 /hour. The patient woke from coughing spells, congestion-      OXYGEN SATURATION & C02:  The Wake baseline 02 saturation was 95%, with the lowest being 85%. Time spent below 89% saturation equaled 8 minutes. The arousals were noted as: 57 were spontaneous, 0 were associated with PLMs, 11 were associated with respiratory events. The patient had a total of 0 Periodic Limb Movements.   Audio and video analysis did not show any abnormal or unusual movements, behaviors, phonations or vocalizations.  Coughing spells were evident. Loud Snoring was noted. EKG was in keeping with normal sinus rhythm (NSR), and sinus bradycardia.  IMPRESSION: 1. Mild Obstructive Sleep Apnea (OSA) at AHI 6.3/h and REM AHI 7.5/h, supine position AHI was 13.3/h.  2. Primary coughing and Snoring.  RECOMMENDATIONS: He hates his current mask and is very unhappy with [employer], DME- and has no communication-   1. CPAP auto- titration would be optimal- I think we have not enough sleep time to get reliable data from this short sleep record. Please refit for a new interface !. (If there is a problem with Medicare I will suggest to repeat this as a HST for the patient, hopefully getting enough sleep that way).  2. Avoiding supine sleep position is an effective treatment.  3. Coughing and congestion influenced this sleep test- both need to be treated.   I certify that I have reviewed the entire raw data recording prior to the issuance of this report in accordance with the Standards of Accreditation of the [employer] (AASM)  Larey Seat, MD

## 2020-03-26 ENCOUNTER — Telehealth: Payer: Self-pay | Admitting: Neurology

## 2020-03-26 ENCOUNTER — Encounter: Payer: Self-pay | Admitting: Neurology

## 2020-03-26 NOTE — Telephone Encounter (Signed)
-----   Message from Larey Seat, MD sent at 03/25/2020  4:42 PM EDT ----- 1. Mild Obstructive Sleep Apnea (OSA) at AHI 10.4/h and non-REM AHI 10.5/h, supine position AHI was 13.3/h.  2. Primary coughing and Snoring.  RECOMMENDATIONS: He hates his current mask and is very unhappy with [employer], DME- and has no communication-   1. CPAP auto- titration would be optimal- I think we have not enough sleep time to get reliable data from this short sleep record. Please refit for a new interface !. (If there is a problem with Medicare I will suggest to repeat this as a HST for the patient, hopefully getting enough sleep that way).  2. Avoiding supine sleep position is an effective treatment.  3. Coughing and congestion influenced this sleep test- both need to be treated.

## 2020-03-26 NOTE — Telephone Encounter (Signed)
Called patient to discuss sleep study results. No answer at this time. LVM for the patient to call back.   

## 2020-03-27 NOTE — Telephone Encounter (Signed)
I called pt. I advised pt that Dr. Brett Fairy reviewed their sleep study results and found that pt has mild sleep apnea. Dr. Brett Fairy recommends that pt starts auto CPAP. I reviewed PAP compliance expectations with the pt. Pt is agreeable to starting a CPAP. I advised pt that an order will be sent to a DME, [facility] and [facility] [employer] will call the pt within about one week after they file with the pt's insurance. [facility] will show the pt how to use the machine, fit for masks, and troubleshoot the CPAP if needed. A follow up appt was made for insurance purposes with Ward Givens, NP on Sept 2,2021 at 11 am. Pt verbalized understanding to arrive 15 minutes early and bring their CPAP. A letter with all of this information in it will be mailed to the pt as a reminder. I verified with the pt that the address we have on file is correct. Pt verbalized understanding of results. Pt had no questions at this time but was encouraged to call back if questions arise. I have sent the order to [employer] and have received confirmation that they have received the order.

## 2020-03-28 ENCOUNTER — Other Ambulatory Visit: Payer: Self-pay | Admitting: Neurology

## 2020-03-28 DIAGNOSIS — Z9989 Dependence on other enabling machines and devices: Secondary | ICD-10-CM

## 2020-03-28 DIAGNOSIS — G4733 Obstructive sleep apnea (adult) (pediatric): Secondary | ICD-10-CM

## 2020-03-28 DIAGNOSIS — Z8673 Personal history of transient ischemic attack (TIA), and cerebral infarction without residual deficits: Secondary | ICD-10-CM

## 2020-03-31 ENCOUNTER — Ambulatory Visit (INDEPENDENT_AMBULATORY_CARE_PROVIDER_SITE_OTHER): Payer: Medicare PPO | Admitting: *Deleted

## 2020-03-31 DIAGNOSIS — R55 Syncope and collapse: Secondary | ICD-10-CM

## 2020-03-31 LAB — CUP PACEART REMOTE DEVICE CHECK
Date Time Interrogation Session: 20210628093606
Implantable Pulse Generator Implant Date: 20210526

## 2020-04-01 NOTE — Progress Notes (Signed)
Carelink Summary Report / Loop Recorder 

## 2020-04-02 ENCOUNTER — Encounter: Payer: Self-pay | Admitting: Neurology

## 2020-04-24 ENCOUNTER — Other Ambulatory Visit: Payer: Self-pay | Admitting: Cardiovascular Disease

## 2020-04-24 ENCOUNTER — Other Ambulatory Visit: Payer: Self-pay | Admitting: Urology

## 2020-04-24 DIAGNOSIS — C61 Malignant neoplasm of prostate: Secondary | ICD-10-CM

## 2020-04-30 ENCOUNTER — Encounter: Payer: Self-pay | Admitting: Urology

## 2020-04-30 ENCOUNTER — Ambulatory Visit (HOSPITAL_COMMUNITY): Payer: Medicare PPO | Admitting: Physician Assistant

## 2020-04-30 NOTE — Progress Notes (Signed)
Patient is scheduled for fiducial markers and SpaceOAR gel 05/01/20 with Dr. Junious Silk.  Nicholos Johns, MMS, PA-C [facility] [place]: [phone]  Fax: [phone]

## 2020-05-05 ENCOUNTER — Ambulatory Visit (INDEPENDENT_AMBULATORY_CARE_PROVIDER_SITE_OTHER): Payer: Medicare PPO | Admitting: *Deleted

## 2020-05-05 DIAGNOSIS — M67873 Other specified disorders of tendon, right ankle and foot: Secondary | ICD-10-CM | POA: Diagnosis not present

## 2020-05-05 DIAGNOSIS — R55 Syncope and collapse: Secondary | ICD-10-CM

## 2020-05-05 DIAGNOSIS — M79642 Pain in left hand: Secondary | ICD-10-CM | POA: Diagnosis not present

## 2020-05-05 LAB — CUP PACEART REMOTE DEVICE CHECK
Date Time Interrogation Session: 20210731094021
Implantable Pulse Generator Implant Date: 20210526

## 2020-05-06 DIAGNOSIS — G4733 Obstructive sleep apnea (adult) (pediatric): Secondary | ICD-10-CM | POA: Diagnosis not present

## 2020-05-07 ENCOUNTER — Telehealth: Payer: Self-pay | Admitting: *Deleted

## 2020-05-07 NOTE — Telephone Encounter (Signed)
Called patient to inform of sim and MRI, lvm for a return call

## 2020-05-07 NOTE — Progress Notes (Signed)
Carelink Summary Report / Loop Recorder 

## 2020-05-07 NOTE — Telephone Encounter (Signed)
xxxx 

## 2020-05-08 DIAGNOSIS — M79642 Pain in left hand: Secondary | ICD-10-CM | POA: Diagnosis not present

## 2020-05-08 DIAGNOSIS — M65322 Trigger finger, left index finger: Secondary | ICD-10-CM | POA: Diagnosis not present

## 2020-05-12 ENCOUNTER — Telehealth: Payer: Self-pay | Admitting: *Deleted

## 2020-05-12 NOTE — Telephone Encounter (Signed)
RETURNED PATIENT'S PHONE CALL, LVM FOR A RETURN CALL 

## 2020-05-13 ENCOUNTER — Ambulatory Visit (HOSPITAL_COMMUNITY)
Admission: RE | Admit: 2020-05-13 | Discharge: 2020-05-13 | Disposition: A | Discharge status: Home / Self Care | Payer: Medicare PPO | Source: Ambulatory Visit | Attending: Physician Assistant | Admitting: Physician Assistant

## 2020-05-13 ENCOUNTER — Encounter (HOSPITAL_COMMUNITY): Payer: Self-pay | Admitting: Physician Assistant

## 2020-05-13 ENCOUNTER — Other Ambulatory Visit: Payer: Self-pay

## 2020-05-13 VITALS — BP 124/64 | HR 75 | Ht 73.0 in | Wt 271.2 lb

## 2020-05-13 DIAGNOSIS — G4733 Obstructive sleep apnea (adult) (pediatric): Secondary | ICD-10-CM | POA: Diagnosis not present

## 2020-05-13 DIAGNOSIS — Z87891 Personal history of nicotine dependence: Secondary | ICD-10-CM | POA: Insufficient documentation

## 2020-05-13 DIAGNOSIS — I10 Essential (primary) hypertension: Secondary | ICD-10-CM | POA: Insufficient documentation

## 2020-05-13 DIAGNOSIS — Z8249 Family history of ischemic heart disease and other diseases of the circulatory system: Secondary | ICD-10-CM | POA: Diagnosis not present

## 2020-05-13 DIAGNOSIS — Z79899 Other long term (current) drug therapy: Secondary | ICD-10-CM | POA: Diagnosis not present

## 2020-05-13 DIAGNOSIS — I2510 Atherosclerotic heart disease of native coronary artery without angina pectoris: Secondary | ICD-10-CM | POA: Diagnosis not present

## 2020-05-13 DIAGNOSIS — K219 Gastro-esophageal reflux disease without esophagitis: Secondary | ICD-10-CM | POA: Diagnosis not present

## 2020-05-13 DIAGNOSIS — D6869 Other thrombophilia: Secondary | ICD-10-CM

## 2020-05-13 DIAGNOSIS — Z7901 Long term (current) use of anticoagulants: Secondary | ICD-10-CM | POA: Diagnosis not present

## 2020-05-13 DIAGNOSIS — E785 Hyperlipidemia, unspecified: Secondary | ICD-10-CM | POA: Insufficient documentation

## 2020-05-13 DIAGNOSIS — I480 Paroxysmal atrial fibrillation: Secondary | ICD-10-CM | POA: Diagnosis not present

## 2020-05-13 LAB — BASIC METABOLIC PANEL
Anion gap: 9 (ref 5–15)
BUN: 28 mg/dL — ABNORMAL HIGH (ref 8–23)
CO2: 22 mmol/L (ref 22–32)
Calcium: 9.7 mg/dL (ref 8.9–10.3)
Chloride: 105 mmol/L (ref 98–111)
Creatinine, Ser: 1.15 mg/dL (ref 0.61–1.24)
GFR calc Af Amer: >60 mL/min (ref >60)
GFR calc non Af Amer: >60 mL/min (ref >60)
Glucose, Bld: 129 mg/dL — ABNORMAL HIGH (ref 70–99)
Potassium: 4.6 mmol/L (ref 3.5–5.1)
Sodium: 136 mmol/L (ref 135–145)

## 2020-05-13 LAB — MAGNESIUM: Magnesium: 1.9 mg/dL (ref 1.7–2.4)

## 2020-05-13 NOTE — Progress Notes (Signed)
Primary Care Physician: [name], MD Primary Cardiologist: Dr Acie Fredrickson Primary Electrophysiologist: Dr Rayann Heman Referring Physician: Dr Deatra Canter is a 74 y.o. male with a history of CAD, HTN, OSA, CVA, paroxysmal atrial fibrillation, and HLD who presents for follow up in the [facility].  The patient was initially diagnosed with atrial fibrillation in 2016 after presenting with symptoms of presyncope. He converted on IV diltiazem and has been maintained on Xarelto. Patient had done well for years until 07/06/19 when he presented to the ER with symptoms of palpitations and chest pressure and found to be in afib vs atrial flutter with RVR. He converted while being evaluated in the ER and his symptoms resolved. Since then, he has not had any further symptoms. He denies any specific triggers that he could identify. He has a diagnosis of OSA and is compliant with his CPAP. He denies significant alcohol use. He is on Xarelto for a CHADS2VASC score of 5. Patient had another episode of afib on 09/07/19. There were no specific triggers that the patient could identify. He took the PRN diltiazem without much relief. He presented to the ER in afib with RVR and underwent successful DCCV. Patient is s/p dofetilide loading 12/15-12/18/20. Patient had recurrent episodes of blurred vision and "foggy headedness". Neurologic workup was unremarkable. He underwent ILR implantation with Dr Rayann Heman 02/27/20.  On follow up today, patient reports he has done well from a cardiac standpoint. His ILR shows 0% afib burden. He has been diagnosed with prostate cancer and is scheduled to start radiation therapy.   Today, he denies symptoms of palpitations, chest pain, shortness of breath, orthopnea, PND, lower extremity edema, presyncope, syncope, bleeding, or neurologic sequela. The patient is tolerating medications without difficulties and is otherwise without complaint today.    Atrial  Fibrillation Risk Factors:  he does have symptoms or diagnosis of sleep apnea. he is compliant with CPAP therapy. he does not have a history of rheumatic fever. he does not have a history of alcohol use. The patient does not have a history of early familial atrial fibrillation or other arrhythmias.  he has a BMI of Body mass index is 35.78 kg/m.Marland Kitchen Filed Weights   05/13/20 1003  Weight: 123 kg    Family History  Problem Relation Age of Onset  . Hypertension Mother   . Heart disease Father   . Heart attack Father   . Prostate cancer Brother   . Breast cancer Neg Hx   . Colon cancer Neg Hx   . Pancreatic cancer Neg Hx      Atrial Fibrillation Management history:  Previous antiarrhythmic drugs: dofetilide Previous cardioversions: 09/07/19 Previous ablations: none CHADS2VASC score: 5 Anticoagulation history: Xarelto    Past Medical History:  Diagnosis Date  . Arthritis    "back, right knee, hands, ankles, neck" (05/31/2016)  . Carotid artery disease ([place]) 08/30/2016   Carotid US 11/19: R 1-39, L 40-59 // Carotid US 08/2019: R 1-39; L 40-59; repeat 1 year  . Chronic lower back pain   . Coronary atherosclerosis of native coronary artery    a. BMS to [facility] 2004 and 2007, otherwise mild nonobstructive disease. EF normal.  . Diverticulitis   . Dyslipidemia   . Essential hypertension, benign   . GERD (gastroesophageal reflux disease)   . Lumbar radiculopathy, chronic 02/04/2015   Right L5  . Obesity   . OSA on CPAP   . Paroxysmal atrial fibrillation (HCC)  a. Discovered after stroke.  . Pneumonia 01/1996  . PONV (postoperative nausea and vomiting)   . Prostate CA ([place])   . Recurrent upper respiratory infection (URI)   . Visit for monitoring Tikosyn therapy 09/18/2019   Past Surgical History:  Procedure Laterality Date  . ANTERIOR CERVICAL DECOMP/DISCECTOMY FUSION  07/2001; 10/2002   "C5-6; C6-7; redo"  . BACK SURGERY    . CARPAL TUNNEL RELEASE Left 10/2015  .  COLONOSCOPY W/ POLYPECTOMY  02/2014  . CORONARY ANGIOPLASTY WITH STENT PLACEMENT  05/2003; 12/2005   "mid RCA; mid RCA"  . HAND SURGERY  02/2019   LEFT HAND  . implantable loop recorder placement  02/27/2020   Medtronic Reveal [place] model [id number]  implantable loop recorder implanted by Dr Rayann Heman for afib management and evaluation of presyncope  . JOINT REPLACEMENT    . KNEE ARTHROSCOPY Left 10/2005  . KNEE ARTHROSCOPY W/ PARTIAL MEDIAL MENISCECTOMY Left 09/2005  . LUMBAR LAMINECTOMY/DECOMPRESSION MICRODISCECTOMY  03/2005   "L4-5"  . POSTERIOR LUMBAR FUSION  10/2003   L5-S1; "plates, screws"  . SHOULDER ARTHROSCOPY Right 08/2011   Debridement of labrum, arthroscopic distal clavicle excision  . SHOULDER OPEN ROTATOR CUFF REPAIR Left 07/2014  . TEE WITHOUT CARDIOVERSION  07/07/2012   Procedure: TRANSESOPHAGEAL ECHOCARDIOGRAM (TEE);  Surgeon: Fay Records, MD;  Location: [facility];  Service: Cardiovascular;  Laterality: N/A;  . TONSILLECTOMY AND ADENOIDECTOMY  ~ 1956  . TOTAL KNEE ARTHROPLASTY Left 10/2006  . TRIGGER FINGER RELEASE Left 10/2015    Current Outpatient Medications  Medication Sig Dispense Refill  . acetaminophen (TYLENOL 8 HOUR ARTHRITIS PAIN) 650 MG CR tablet Take 1,300 mg by mouth as needed for pain (pain).     Marland Kitchen ALPRAZolam (XANAX) 0.5 MG tablet Take 0.5 mg by mouth as needed for anxiety.   1  . atorvastatin (LIPITOR) 40 MG tablet Take 1 tablet (40 mg total) by mouth daily. 90 tablet 3  . clindamycin (CLEOCIN) 300 MG capsule clindamycin HCl 300 mg capsule  TAKE 2 CAPSULES BY MOUTH 1 HOUR PRIOR TO DENTAL TREATMENT    . diltiazem (CARDIZEM) 30 MG tablet Take 1 tablet (30 mg total) by mouth 4 (four) times daily as needed. 60 tablet 6  . docusate sodium (COLACE) 100 MG capsule Take 100 mg by mouth 2 (two) times daily.    Marland Kitchen dofetilide (TIKOSYN) 500 MCG capsule TAKE 1 CAPSULE BY MOUTH 2 TIMES DAILY. 108 capsule 3  . fenofibrate 160 MG tablet TAKE 1 TABLET BY MOUTH EVERY  DAY 90 tablet 3  . finasteride (PROSCAR) 5 MG tablet Take 5 mg by mouth daily.     . fluticasone (FLONASE) 50 MCG/ACT nasal spray Place 2 sprays into the nose daily.     Marland Kitchen leuprolide, 6 Month, (ELIGARD) 45 MG injection Inject 45 mg into the skin every 6 (six) months.    . levocetirizine (XYZAL) 5 MG tablet Take 5 mg by mouth every evening.    Marland Kitchen lisinopril (ZESTRIL) 10 MG tablet Take 1 tablet (10 mg total) by mouth daily. 90 tablet 3  . magnesium oxide (MAG-OX) 400 MG tablet Take 1 tablet (400 mg total) by mouth daily. 90 tablet 3  . metoprolol succinate (TOPROL-XL) 50 MG 24 hr tablet Take 1 tablet (50 mg total) by mouth daily. Take with or immediately following a meal. 90 tablet 3  . Multiple Vitamins-Minerals (MULTIVITAMINS THER. W/MINERALS) TABS Take 1 tablet by mouth daily.      . nitroGLYCERIN (NITROSTAT) 0.4 MG  SL tablet Place 1 tablet (0.4 mg total) under the tongue every 5 (five) minutes as needed for chest pain. 25 tablet 5  . omeprazole (PRILOSEC) 10 MG capsule Take 1 capsule (10 mg total) by mouth daily. 90 capsule 3  . potassium chloride (KLOR-CON) 10 MEQ tablet Take 1 tablet (10 mEq total) by mouth daily. Please make yearly appt with Dr. Acie Fredrickson for December for future refills. 1st attempt 90 tablet 1  . sildenafil (REVATIO) 20 MG tablet Take 2 to 5 tabs daily as needed for erectile dysfunction 30 tablet 2  . Tamsulosin HCl (FLOMAX) 0.4 MG CAPS Take 0.4 mg by mouth daily.      Marland Kitchen topiramate (TOPAMAX) 50 MG tablet Take 2 tablets (100 mg total) by mouth 2 (two) times daily. (Patient taking differently: Take 100 mg by mouth. Take 2 tablets by mouth in the AM and 1 tablet by mouth in the evening) 360 tablet 3  . XARELTO 20 MG TABS tablet TAKE 1 TABLET BY MOUTH EVERY DAY 90 tablet 3   No current facility-administered medications for this encounter.    Allergies  Allergen Reactions  . Amoxicillin Swelling  . Fosinopril Other (See Comments)  . Ampicillin Rash  . Codeine Nausea Only and  Other (See Comments)    Heavy amounts cause nausea  . Monopril [Fosinopril Sodium] Other (See Comments)    Muscle aches & pain  . Dilaudid [Hydromorphone Hcl] Nausea Only  . Percocet [Oxycodone-Acetaminophen] Nausea And Vomiting    Social History   Socioeconomic History  . Marital status: Married    Spouse name: [name]  . Number of children: 3  . Years of education: 96  . Highest education level: Not on file  Occupational History  . Occupation: Retired    Fish farm manager: DISABLED     Comment: Works at [employer] part time  Tobacco Use  . Smoking status: Former Smoker    Packs/day: 1.00    Years: 34.00    Pack years: 34.00    Types: Cigarettes    Quit date: 05/05/2003    Years since quitting: 17.0  . Smokeless tobacco: Never Used  Vaping Use  . Vaping Use: Never used  Substance and Sexual Activity  . Alcohol use: No    Alcohol/week: 0.0 standard drinks  . Drug use: No  . Sexual activity: Not Currently  Other Topics Concern  . Not on file  Social History Narrative   Patient lives at home with spouse.   Caffeine Use:    Social Determinants of Health   Financial Resource Strain:   . Difficulty of Paying Living Expenses:   Food Insecurity:   . Worried About Charity fundraiser in the Last Year:   . Arboriculturist in the Last Year:   Transportation Needs:   . Film/video editor (Medical):   Marland Kitchen Lack of Transportation (Non-Medical):   Physical Activity:   . Days of Exercise per Week:   . Minutes of Exercise per Session:   Stress:   . Feeling of Stress :   Social Connections:   . Frequency of Communication with Friends and Family:   . Frequency of Social Gatherings with Friends and Family:   . Attends Religious Services:   . Active Member of Clubs or Organizations:   . Attends Archivist Meetings:   Marland Kitchen Marital Status:   Intimate Partner Violence:   . Fear of Current or Ex-Partner:   . Emotionally Abused:   Marland Kitchen Physically Abused:   .  Sexually Abused:       ROS- All systems are reviewed and negative except as per the HPI above.  Physical Exam: Vitals:   05/13/20 1003  BP: 124/64  Pulse: 75  SpO2: 98%  Weight: 123 kg  Height: 6\' 1"  (1.854 m)    GEN- The patient is well appearing obese male, alert and oriented x 3 today.   HEENT-head normocephalic, atraumatic, sclera clear, conjunctiva pink, hearing intact, trachea midline. Lungs- Clear to ausculation bilaterally, normal work of breathing Heart- Regular rate and rhythm, no murmurs, rubs or gallops  GI- soft, NT, ND, + BS Extremities- no clubbing, cyanosis, or edema MS- no significant deformity or atrophy Skin- no rash or lesion Psych- euthymic mood, full affect Neuro- strength and sensation are intact   Wt Readings from Last 3 Encounters:  05/13/20 123 kg  03/17/20 124.7 kg  03/04/20 121.1 kg    EKG today demonstrates SR HR 79, PR 164, QRS 94, QTc 459 manually calculated  Echo 07/19/19 demonstrated   1. Left ventricular ejection fraction, by visual estimation, is 60 to 65%. The left ventricle has normal function. Normal left ventricular size. There is mildly increased left ventricular hypertrophy.  2. Definity contrast agent was given IV to delineate the left ventricular endocardial borders.  3. Global right ventricle has normal systolic function.The right ventricular size is normal. No increase in right ventricular wall thickness.  4. Left atrial size was mildly dilated.  5. Right atrial size was normal.  6. The mitral valve is normal in structure. Trace mitral valve regurgitation. No evidence of mitral stenosis.  7. The tricuspid valve is normal in structure. Tricuspid valve regurgitation was not visualized by color flow Doppler.  8. The aortic valve is tricuspid Aortic valve regurgitation was not visualized by color flow Doppler. Mild aortic valve sclerosis without stenosis.  9. The pulmonic valve was normal in structure. Pulmonic valve regurgitation is not visualized  by color flow Doppler. 10. The inferior vena cava is normal in size with greater than 50% respiratory variability, suggesting right atrial pressure of 3 mmHg.  Epic records are reviewed at length today  Assessment and Plan:  1. Paroxysmal atrial fibrillation S/p dofetilide loading 12/15-12/18/20. 0% afib burden on ILR. Continue dofetilide 500 mcg BID. QT stable. Check bmet/mag today. Continue Xarelto 20 mg daily.  Continue Toprol 50 mg daily Continue diltiazem 30 mg PRN q4hrs for heart racing.  This patients CHA2DS2-VASc Score and unadjusted Ischemic Stroke Rate (% per year) is equal to 7.2 % stroke rate/year from a score of 5  Above score calculated as 1 point each if present [CHF, HTN, DM, Vascular=MI/PAD/Aortic Plaque, Age if 65-74, or Male] Above score calculated as 2 points each if present [Age > 75, or Stroke/TIA/TE]  2. Obesity Body mass index is 35.78 kg/m. Lifestyle modification was discussed and encouraged including regular physical activity and weight reduction.  3. Obstructive sleep apnea Patient reports compliance with CPAP therapy.  4. CAD Normal stress myoveiw 10/17/19 No anginal symptoms.  5. HTN Stable, no changes today.   Follow up with Dr Rayann Heman as scheduled. AF clinic in 4 months.    [facility] [address] [place], [place] [address] [phone] 05/13/2020 6:09 PM

## 2020-05-14 ENCOUNTER — Encounter (HOSPITAL_COMMUNITY): Payer: Self-pay

## 2020-05-14 ENCOUNTER — Telehealth: Payer: Self-pay | Admitting: Radiation Oncology

## 2020-05-14 ENCOUNTER — Other Ambulatory Visit: Payer: Self-pay | Admitting: Urology

## 2020-05-14 MED ORDER — ALPRAZOLAM 0.5 MG PO TABS: 0.5000 mg | ORAL_TABLET | ORAL | 0 refills | Status: DC | PRN

## 2020-05-14 NOTE — Telephone Encounter (Signed)
Received voicemail message from patient upset is had been unable to obtain medication to relieve severe claustrophobia in preparation for MRI on Friday. Patient request my assistance with the matter. Patient scheduled for CT simulation of 05/16/20 at 1430 then MRI of pelvis at 1600. Patient has a pacemaker and a fib. Old records indicate patient used xanax in the past for similar issue. Preferred pharmacy is [facility] [address].

## 2020-05-14 NOTE — Telephone Encounter (Signed)
This is the first I have heard of the request but it has now been sent. Corey [name]

## 2020-05-15 ENCOUNTER — Other Ambulatory Visit: Payer: Self-pay | Admitting: Family Medicine

## 2020-05-15 ENCOUNTER — Telehealth: Payer: Self-pay | Admitting: Radiation Oncology

## 2020-05-15 ENCOUNTER — Telehealth: Payer: Self-pay | Admitting: *Deleted

## 2020-05-15 DIAGNOSIS — I714 Abdominal aortic aneurysm, without rupture, unspecified: Secondary | ICD-10-CM

## 2020-05-15 NOTE — Telephone Encounter (Signed)
CALLED PATIENT TO REMIND OF SIM AND MRI FOR 05-16-20, SPOKE WITH PATIENT AND HE IS AWARE OF THESE APPTS.

## 2020-05-15 NOTE — Telephone Encounter (Signed)
Phoned patient. No answer. Left voicemail message explaining xanax has been sent to his pharmacy. Stressed he should not drive while taking xanax. Provided my direct number for future questions or needs.

## 2020-05-16 ENCOUNTER — Ambulatory Visit (HOSPITAL_COMMUNITY)
Admission: RE | Admit: 2020-05-16 | Discharge: 2020-05-16 | Disposition: A | Discharge status: Home / Self Care | Payer: Medicare PPO | Source: Ambulatory Visit | Attending: Urology | Admitting: Urology

## 2020-05-16 ENCOUNTER — Ambulatory Visit
Admission: RE | Admit: 2020-05-16 | Discharge: 2020-05-16 | Disposition: A | Discharge status: Home / Self Care | Payer: Medicare PPO | Source: Ambulatory Visit | Attending: Radiation Oncology | Admitting: Radiation Oncology

## 2020-05-16 ENCOUNTER — Other Ambulatory Visit: Payer: Self-pay

## 2020-05-16 DIAGNOSIS — Z510 Encounter for antineoplastic radiation therapy: Secondary | ICD-10-CM | POA: Insufficient documentation

## 2020-05-16 DIAGNOSIS — C61 Malignant neoplasm of prostate: Secondary | ICD-10-CM | POA: Diagnosis not present

## 2020-05-18 NOTE — Progress Notes (Signed)
  Radiation Oncology         [phone] ________________________________  Name: Corey [name] MRN: [id number]  Date: 05/16/2020  DOB: Oct 19, 1945  SIMULATION AND TREATMENT PLANNING NOTE    ICD-10-CM   1. Malignant neoplasm of prostate ([place])  C61     DIAGNOSIS:   74 y.o. 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  NARRATIVE:  The patient was brought to the [facility].  Identity was confirmed.  All relevant records and images related to the planned course of therapy were reviewed.  The patient freely provided informed written consent to proceed with treatment after reviewing the details related to the planned course of therapy. The consent form was witnessed and verified by the simulation staff.  Then, the patient was set-up in a stable reproducible supine position for radiation therapy.  A vacuum lock pillow device was custom fabricated to position his legs in a reproducible immobilized position.  Then, I performed a urethrogram under sterile conditions to identify the prostatic bed.  CT images were obtained.  Surface markings were placed.  The CT images were loaded into the planning software.  Then the prostate bed target, pelvic lymph node target and avoidance structures including the rectum, bladder, bowel and hips were contoured.  Treatment planning then occurred.  The radiation prescription was entered and confirmed.  A total of one complex treatment devices were fabricated. I have requested : Intensity Modulated Radiotherapy (IMRT) is medically necessary for this case for the following reason:  Rectal sparing.Marland Kitchen  PLAN:  The patient will receive 45 Gy in 25 fractions of 1.8 Gy, followed by a boost to the prostate to a total dose of 75 Gy with 15 additional fractions of 2 Gy.   ________________________________  Sheral Apley Tammi Klippel, M.D.

## 2020-05-19 ENCOUNTER — Encounter: Payer: Medicare PPO | Admitting: Internal Medicine

## 2020-05-20 ENCOUNTER — Other Ambulatory Visit (HOSPITAL_COMMUNITY): Payer: Self-pay | Admitting: Cardiovascular Disease

## 2020-05-23 ENCOUNTER — Telehealth: Payer: Self-pay | Admitting: Radiation Oncology

## 2020-05-23 NOTE — Telephone Encounter (Signed)
Received voicemail message from patient requesting a return call.   Patient with prostate cancer, gleason 4+5 and psa 4.79 at diagnosis. Patient has received Eligard 2.5 months ago. Scheduled to begin radiation therapy on Tuesday (05/27/20) at 1615. Patient reports he has been full vaccinated for COVID 19 receiving his second shot in February.   Phoned patient to inquire. Patient verbalizes his frustration that he has been indirectly exposed to [place] 19. Patient explains that he and his wife both work at [employer] a and Wednesday they were informed an unvaccinated coworker had tested positive for COVID. Patient explains he took this week off to prepare for the coming week of radiation but his wife has been working thus he has been indirectly exposed. Patient confirms that both he and his wife are never without their mask. Patient confirms his wife has also been vaccinated. Patient confirms they are both asymptomatic.   Agreed upon plan: Patient and wife will be rapid tested at [facility] at 1715 Monday evening (5 day s/p exposure). This RN will contact patient Tuesday morning to find out results. Treatment plans will be made accordingly at that point.

## 2020-05-26 DIAGNOSIS — C61 Malignant neoplasm of prostate: Secondary | ICD-10-CM | POA: Diagnosis not present

## 2020-05-26 DIAGNOSIS — Z20822 Contact with and (suspected) exposure to covid-19: Secondary | ICD-10-CM | POA: Diagnosis not present

## 2020-05-26 DIAGNOSIS — Z510 Encounter for antineoplastic radiation therapy: Secondary | ICD-10-CM | POA: Diagnosis not present

## 2020-05-26 DIAGNOSIS — Z03818 Encounter for observation for suspected exposure to other biological agents ruled out: Secondary | ICD-10-CM | POA: Diagnosis not present

## 2020-05-27 ENCOUNTER — Ambulatory Visit
Admission: RE | Admit: 2020-05-27 | Discharge: 2020-05-27 | Disposition: A | Discharge status: Home / Self Care | Payer: Medicare PPO | Source: Ambulatory Visit | Attending: Radiation Oncology | Admitting: Radiation Oncology

## 2020-05-27 ENCOUNTER — Telehealth: Payer: Self-pay | Admitting: Radiation Oncology

## 2020-05-27 ENCOUNTER — Other Ambulatory Visit: Payer: Self-pay

## 2020-05-27 DIAGNOSIS — Z510 Encounter for antineoplastic radiation therapy: Secondary | ICD-10-CM | POA: Diagnosis not present

## 2020-05-27 DIAGNOSIS — C61 Malignant neoplasm of prostate: Secondary | ICD-10-CM | POA: Diagnosis not present

## 2020-05-27 NOTE — Telephone Encounter (Signed)
Received voicemail message from patient requesting return call. Phoned patient back. Patient reports rapid covid testing done last night was negative. Informed Ashlyn Bruning, PA-C of these findings and she agreed patient is safe to begin radiation treatment today as scheduled. Informed patient of such. Patient verbalized understanding and expressed appreciation for the call back.

## 2020-05-28 ENCOUNTER — Other Ambulatory Visit: Payer: Medicare PPO

## 2020-05-28 ENCOUNTER — Other Ambulatory Visit: Payer: Self-pay

## 2020-05-28 ENCOUNTER — Ambulatory Visit
Admission: RE | Admit: 2020-05-28 | Discharge: 2020-05-28 | Disposition: A | Discharge status: Home / Self Care | Payer: Medicare PPO | Source: Ambulatory Visit | Attending: Radiation Oncology | Admitting: Radiation Oncology

## 2020-05-28 DIAGNOSIS — Z510 Encounter for antineoplastic radiation therapy: Secondary | ICD-10-CM | POA: Diagnosis not present

## 2020-05-28 DIAGNOSIS — C61 Malignant neoplasm of prostate: Secondary | ICD-10-CM | POA: Diagnosis not present

## 2020-05-29 ENCOUNTER — Ambulatory Visit
Admission: RE | Admit: 2020-05-29 | Discharge: 2020-05-29 | Disposition: A | Discharge status: Home / Self Care | Payer: Medicare PPO | Source: Ambulatory Visit | Attending: Radiation Oncology | Admitting: Radiation Oncology

## 2020-05-29 ENCOUNTER — Other Ambulatory Visit: Payer: Self-pay

## 2020-05-29 DIAGNOSIS — C61 Malignant neoplasm of prostate: Secondary | ICD-10-CM | POA: Diagnosis not present

## 2020-05-29 DIAGNOSIS — Z510 Encounter for antineoplastic radiation therapy: Secondary | ICD-10-CM | POA: Diagnosis not present

## 2020-05-30 ENCOUNTER — Other Ambulatory Visit: Payer: Self-pay

## 2020-05-30 ENCOUNTER — Ambulatory Visit
Admission: RE | Admit: 2020-05-30 | Discharge: 2020-05-30 | Disposition: A | Discharge status: Home / Self Care | Payer: Medicare PPO | Source: Ambulatory Visit | Attending: Radiation Oncology | Admitting: Radiation Oncology

## 2020-05-30 DIAGNOSIS — C61 Malignant neoplasm of prostate: Secondary | ICD-10-CM | POA: Diagnosis not present

## 2020-05-30 DIAGNOSIS — Z510 Encounter for antineoplastic radiation therapy: Secondary | ICD-10-CM | POA: Diagnosis not present

## 2020-05-30 NOTE — Progress Notes (Signed)
Complete post sim education with patient today. Oriented patient to staff and routine of the clinic. Provided patient with RADIATION THERAPY AND YOU handbook then, reviewed pertinent information. Educated patient reference potential side effects and management such as fatigue, diarrhea and urinary/bladder changes. Answered all patient questions to the best of my ability. Provided patient with my business card and encouraged him to call with future needs. Patient verbalized understanding of all reviewed.

## 2020-06-02 ENCOUNTER — Ambulatory Visit
Admission: RE | Admit: 2020-06-02 | Discharge: 2020-06-02 | Disposition: A | Discharge status: Home / Self Care | Payer: Medicare PPO | Source: Ambulatory Visit | Attending: Radiation Oncology | Admitting: Radiation Oncology

## 2020-06-02 ENCOUNTER — Other Ambulatory Visit: Payer: Self-pay

## 2020-06-02 DIAGNOSIS — C61 Malignant neoplasm of prostate: Secondary | ICD-10-CM | POA: Diagnosis not present

## 2020-06-02 DIAGNOSIS — Z510 Encounter for antineoplastic radiation therapy: Secondary | ICD-10-CM | POA: Diagnosis not present

## 2020-06-03 ENCOUNTER — Other Ambulatory Visit: Payer: Self-pay

## 2020-06-03 ENCOUNTER — Ambulatory Visit
Admission: RE | Admit: 2020-06-03 | Discharge: 2020-06-03 | Disposition: A | Discharge status: Home / Self Care | Payer: Medicare PPO | Source: Ambulatory Visit | Attending: Radiation Oncology | Admitting: Radiation Oncology

## 2020-06-03 DIAGNOSIS — Z510 Encounter for antineoplastic radiation therapy: Secondary | ICD-10-CM | POA: Diagnosis not present

## 2020-06-03 DIAGNOSIS — C61 Malignant neoplasm of prostate: Secondary | ICD-10-CM | POA: Diagnosis not present

## 2020-06-04 ENCOUNTER — Other Ambulatory Visit: Payer: Self-pay

## 2020-06-04 ENCOUNTER — Ambulatory Visit
Admission: RE | Admit: 2020-06-04 | Discharge: 2020-06-04 | Disposition: A | Discharge status: Home / Self Care | Payer: Medicare PPO | Source: Ambulatory Visit | Attending: Radiation Oncology | Admitting: Radiation Oncology

## 2020-06-04 DIAGNOSIS — C61 Malignant neoplasm of prostate: Secondary | ICD-10-CM | POA: Diagnosis not present

## 2020-06-04 DIAGNOSIS — Z510 Encounter for antineoplastic radiation therapy: Secondary | ICD-10-CM | POA: Diagnosis not present

## 2020-06-05 ENCOUNTER — Ambulatory Visit (INDEPENDENT_AMBULATORY_CARE_PROVIDER_SITE_OTHER): Payer: Medicare PPO | Admitting: *Deleted

## 2020-06-05 ENCOUNTER — Ambulatory Visit: Payer: Self-pay | Admitting: Adult Health

## 2020-06-05 ENCOUNTER — Ambulatory Visit
Admission: RE | Admit: 2020-06-05 | Discharge: 2020-06-05 | Disposition: A | Discharge status: Home / Self Care | Payer: Medicare PPO | Source: Ambulatory Visit | Attending: Radiation Oncology | Admitting: Radiation Oncology

## 2020-06-05 ENCOUNTER — Encounter: Payer: Self-pay | Admitting: Medical Oncology

## 2020-06-05 ENCOUNTER — Other Ambulatory Visit: Payer: Self-pay

## 2020-06-05 DIAGNOSIS — Z510 Encounter for antineoplastic radiation therapy: Secondary | ICD-10-CM | POA: Diagnosis not present

## 2020-06-05 DIAGNOSIS — C61 Malignant neoplasm of prostate: Secondary | ICD-10-CM | POA: Diagnosis not present

## 2020-06-05 DIAGNOSIS — R55 Syncope and collapse: Secondary | ICD-10-CM

## 2020-06-05 LAB — CUP PACEART REMOTE DEVICE CHECK
Date Time Interrogation Session: 20210902094232
Implantable Pulse Generator Implant Date: 20210526

## 2020-06-06 ENCOUNTER — Ambulatory Visit
Admission: RE | Admit: 2020-06-06 | Discharge: 2020-06-06 | Disposition: A | Discharge status: Home / Self Care | Payer: Medicare PPO | Source: Ambulatory Visit | Attending: Radiation Oncology | Admitting: Radiation Oncology

## 2020-06-06 ENCOUNTER — Other Ambulatory Visit: Payer: Self-pay

## 2020-06-06 DIAGNOSIS — C61 Malignant neoplasm of prostate: Secondary | ICD-10-CM | POA: Diagnosis not present

## 2020-06-06 DIAGNOSIS — Z510 Encounter for antineoplastic radiation therapy: Secondary | ICD-10-CM | POA: Diagnosis not present

## 2020-06-06 DIAGNOSIS — G4733 Obstructive sleep apnea (adult) (pediatric): Secondary | ICD-10-CM | POA: Diagnosis not present

## 2020-06-06 NOTE — Progress Notes (Signed)
Carelink Summary Report / Loop Recorder 

## 2020-06-10 ENCOUNTER — Telehealth: Payer: Self-pay | Admitting: Radiation Oncology

## 2020-06-10 ENCOUNTER — Ambulatory Visit
Admission: RE | Admit: 2020-06-10 | Discharge: 2020-06-10 | Disposition: A | Discharge status: Home / Self Care | Payer: Medicare PPO | Source: Ambulatory Visit | Attending: Radiation Oncology | Admitting: Radiation Oncology

## 2020-06-10 ENCOUNTER — Other Ambulatory Visit: Payer: Self-pay

## 2020-06-10 DIAGNOSIS — Z03818 Encounter for observation for suspected exposure to other biological agents ruled out: Secondary | ICD-10-CM | POA: Diagnosis not present

## 2020-06-10 DIAGNOSIS — Z20822 Contact with and (suspected) exposure to covid-19: Secondary | ICD-10-CM | POA: Diagnosis not present

## 2020-06-10 DIAGNOSIS — Z510 Encounter for antineoplastic radiation therapy: Secondary | ICD-10-CM | POA: Diagnosis not present

## 2020-06-10 DIAGNOSIS — C61 Malignant neoplasm of prostate: Secondary | ICD-10-CM | POA: Diagnosis not present

## 2020-06-10 NOTE — Telephone Encounter (Signed)
Received voicemail message from patient concerned about frequency of bowel movements. Patient with a history of diverticulitis. Patient denies diarrhea. However, patient reports frequent soft bowel movements as often as 5-7 times per day. Reports taking "some Imodium." Patient questions if there are other alternatives or suggestions we might have.   Friday, September 3, patient was evaluated by Dr. Tammi Klippel and myself to satisfy PUT encounter. Low residue diet handout and sitz bath were given. Patient verbalized understanding of all reviewed.   Patient understands this RN will inform the provider of his request for alternative and phone back with further directions.   Patient is actively receiving radiation therapy to his prostate pelvic region. He has received 10 of 25 intended fractions.

## 2020-06-10 NOTE — Telephone Encounter (Signed)
I agree with using Imodium prn or he could try using the Metamucil, 1 scoop in 4 oz water, instead of 8oz. 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 Corey [name]

## 2020-06-11 ENCOUNTER — Other Ambulatory Visit: Payer: Self-pay

## 2020-06-11 ENCOUNTER — Telehealth: Payer: Self-pay | Admitting: Radiation Oncology

## 2020-06-11 ENCOUNTER — Ambulatory Visit
Admission: RE | Admit: 2020-06-11 | Discharge: 2020-06-11 | Disposition: A | Discharge status: Home / Self Care | Payer: Medicare PPO | Source: Ambulatory Visit | Attending: Radiation Oncology | Admitting: Radiation Oncology

## 2020-06-11 DIAGNOSIS — C61 Malignant neoplasm of prostate: Secondary | ICD-10-CM | POA: Diagnosis not present

## 2020-06-11 DIAGNOSIS — Z510 Encounter for antineoplastic radiation therapy: Secondary | ICD-10-CM | POA: Diagnosis not present

## 2020-06-11 NOTE — Telephone Encounter (Signed)
Phoned patient. Instructed him to continue Imodium prn while being cautious of constipation. Encouraged he try using the Metamucil, 1 scoop in 4 oz water, instead of 8oz. 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 Patient verbalized understanding.

## 2020-06-12 ENCOUNTER — Other Ambulatory Visit: Payer: Self-pay | Admitting: Family Medicine

## 2020-06-12 ENCOUNTER — Ambulatory Visit
Admission: RE | Admit: 2020-06-12 | Discharge: 2020-06-12 | Disposition: A | Discharge status: Home / Self Care | Payer: Medicare PPO | Source: Ambulatory Visit | Attending: Radiation Oncology | Admitting: Radiation Oncology

## 2020-06-12 ENCOUNTER — Other Ambulatory Visit: Payer: Self-pay

## 2020-06-12 DIAGNOSIS — Z510 Encounter for antineoplastic radiation therapy: Secondary | ICD-10-CM | POA: Diagnosis not present

## 2020-06-12 DIAGNOSIS — C61 Malignant neoplasm of prostate: Secondary | ICD-10-CM | POA: Diagnosis not present

## 2020-06-13 ENCOUNTER — Other Ambulatory Visit: Payer: Self-pay

## 2020-06-13 ENCOUNTER — Ambulatory Visit
Admission: RE | Admit: 2020-06-13 | Discharge: 2020-06-13 | Disposition: A | Discharge status: Home / Self Care | Payer: Medicare PPO | Source: Ambulatory Visit | Attending: Radiation Oncology | Admitting: Radiation Oncology

## 2020-06-13 DIAGNOSIS — Z510 Encounter for antineoplastic radiation therapy: Secondary | ICD-10-CM | POA: Diagnosis not present

## 2020-06-13 DIAGNOSIS — C61 Malignant neoplasm of prostate: Secondary | ICD-10-CM | POA: Diagnosis not present

## 2020-06-16 ENCOUNTER — Other Ambulatory Visit: Payer: Medicare PPO

## 2020-06-16 ENCOUNTER — Ambulatory Visit
Admission: RE | Admit: 2020-06-16 | Discharge: 2020-06-16 | Disposition: A | Discharge status: Home / Self Care | Payer: Medicare PPO | Source: Ambulatory Visit | Attending: Radiation Oncology | Admitting: Radiation Oncology

## 2020-06-16 ENCOUNTER — Ambulatory Visit
Admission: RE | Admit: 2020-06-16 | Discharge: 2020-06-16 | Disposition: A | Discharge status: Home / Self Care | Payer: Medicare PPO | Source: Ambulatory Visit | Attending: Family Medicine | Admitting: Family Medicine

## 2020-06-16 ENCOUNTER — Inpatient Hospital Stay: Admission: RE | Admit: 2020-06-16 | Payer: Medicare PPO | Source: Ambulatory Visit

## 2020-06-16 ENCOUNTER — Other Ambulatory Visit: Payer: Self-pay

## 2020-06-16 DIAGNOSIS — Z510 Encounter for antineoplastic radiation therapy: Secondary | ICD-10-CM | POA: Diagnosis not present

## 2020-06-16 DIAGNOSIS — I77811 Abdominal aortic ectasia: Secondary | ICD-10-CM | POA: Diagnosis not present

## 2020-06-16 DIAGNOSIS — C61 Malignant neoplasm of prostate: Secondary | ICD-10-CM | POA: Diagnosis not present

## 2020-06-16 DIAGNOSIS — I714 Abdominal aortic aneurysm, without rupture, unspecified: Secondary | ICD-10-CM

## 2020-06-17 ENCOUNTER — Ambulatory Visit: Payer: Medicare PPO

## 2020-06-18 ENCOUNTER — Other Ambulatory Visit: Payer: Self-pay

## 2020-06-18 ENCOUNTER — Ambulatory Visit
Admission: RE | Admit: 2020-06-18 | Discharge: 2020-06-18 | Disposition: A | Discharge status: Home / Self Care | Payer: Medicare PPO | Source: Ambulatory Visit | Attending: Radiation Oncology | Admitting: Radiation Oncology

## 2020-06-18 DIAGNOSIS — Z510 Encounter for antineoplastic radiation therapy: Secondary | ICD-10-CM | POA: Diagnosis not present

## 2020-06-18 DIAGNOSIS — C61 Malignant neoplasm of prostate: Secondary | ICD-10-CM | POA: Diagnosis not present

## 2020-06-19 ENCOUNTER — Ambulatory Visit
Admission: RE | Admit: 2020-06-19 | Discharge: 2020-06-19 | Disposition: A | Discharge status: Home / Self Care | Payer: Medicare PPO | Source: Ambulatory Visit | Attending: Radiation Oncology | Admitting: Radiation Oncology

## 2020-06-19 ENCOUNTER — Other Ambulatory Visit: Payer: Self-pay

## 2020-06-19 DIAGNOSIS — Z510 Encounter for antineoplastic radiation therapy: Secondary | ICD-10-CM | POA: Diagnosis not present

## 2020-06-19 DIAGNOSIS — C61 Malignant neoplasm of prostate: Secondary | ICD-10-CM | POA: Diagnosis not present

## 2020-06-20 ENCOUNTER — Other Ambulatory Visit: Payer: Self-pay

## 2020-06-20 ENCOUNTER — Ambulatory Visit
Admission: RE | Admit: 2020-06-20 | Discharge: 2020-06-20 | Disposition: A | Discharge status: Home / Self Care | Payer: Medicare PPO | Source: Ambulatory Visit | Attending: Radiation Oncology | Admitting: Radiation Oncology

## 2020-06-20 DIAGNOSIS — C61 Malignant neoplasm of prostate: Secondary | ICD-10-CM | POA: Diagnosis not present

## 2020-06-20 DIAGNOSIS — Z510 Encounter for antineoplastic radiation therapy: Secondary | ICD-10-CM | POA: Diagnosis not present

## 2020-06-23 ENCOUNTER — Other Ambulatory Visit: Payer: Self-pay

## 2020-06-23 ENCOUNTER — Ambulatory Visit
Admission: RE | Admit: 2020-06-23 | Discharge: 2020-06-23 | Disposition: A | Discharge status: Home / Self Care | Payer: Medicare PPO | Source: Ambulatory Visit | Attending: Radiation Oncology | Admitting: Radiation Oncology

## 2020-06-23 DIAGNOSIS — Z510 Encounter for antineoplastic radiation therapy: Secondary | ICD-10-CM | POA: Diagnosis not present

## 2020-06-23 DIAGNOSIS — C61 Malignant neoplasm of prostate: Secondary | ICD-10-CM | POA: Diagnosis not present

## 2020-06-24 ENCOUNTER — Other Ambulatory Visit: Payer: Self-pay

## 2020-06-24 ENCOUNTER — Ambulatory Visit
Admission: RE | Admit: 2020-06-24 | Discharge: 2020-06-24 | Disposition: A | Discharge status: Home / Self Care | Payer: Medicare PPO | Source: Ambulatory Visit | Attending: Radiation Oncology | Admitting: Radiation Oncology

## 2020-06-24 DIAGNOSIS — C61 Malignant neoplasm of prostate: Secondary | ICD-10-CM | POA: Diagnosis not present

## 2020-06-24 DIAGNOSIS — Z510 Encounter for antineoplastic radiation therapy: Secondary | ICD-10-CM | POA: Diagnosis not present

## 2020-06-25 ENCOUNTER — Ambulatory Visit
Admission: RE | Admit: 2020-06-25 | Discharge: 2020-06-25 | Disposition: A | Discharge status: Home / Self Care | Payer: Medicare PPO | Source: Ambulatory Visit | Attending: Radiation Oncology | Admitting: Radiation Oncology

## 2020-06-25 ENCOUNTER — Ambulatory Visit: Payer: Medicare PPO | Admitting: Internal Medicine

## 2020-06-25 ENCOUNTER — Telehealth: Payer: Self-pay | Admitting: Radiation Oncology

## 2020-06-25 ENCOUNTER — Other Ambulatory Visit: Payer: Self-pay

## 2020-06-25 VITALS — BP 130/70 | HR 67 | Ht 73.0 in | Wt 264.8 lb

## 2020-06-25 DIAGNOSIS — I480 Paroxysmal atrial fibrillation: Secondary | ICD-10-CM

## 2020-06-25 DIAGNOSIS — G4733 Obstructive sleep apnea (adult) (pediatric): Secondary | ICD-10-CM | POA: Diagnosis not present

## 2020-06-25 DIAGNOSIS — I10 Essential (primary) hypertension: Secondary | ICD-10-CM

## 2020-06-25 DIAGNOSIS — D6869 Other thrombophilia: Secondary | ICD-10-CM

## 2020-06-25 DIAGNOSIS — Z510 Encounter for antineoplastic radiation therapy: Secondary | ICD-10-CM | POA: Diagnosis not present

## 2020-06-25 DIAGNOSIS — C61 Malignant neoplasm of prostate: Secondary | ICD-10-CM | POA: Diagnosis not present

## 2020-06-25 LAB — CUP PACEART INCLINIC DEVICE CHECK
Date Time Interrogation Session: 20210922130436
Implantable Pulse Generator Implant Date: 20210526

## 2020-06-25 MED ORDER — METOPROLOL SUCCINATE ER 25 MG PO TB24: 25.0000 mg | ORAL_TABLET | Freq: Every day | ORAL | 0 refills | Status: DC

## 2020-06-25 NOTE — Progress Notes (Signed)
PCP: [name], MD Primary Cardiologist: Dr Acie Fredrickson Primary EP: Dr Larene Pickett is a 74 y.o. male who presents today for routine electrophysiology followup.  Since last being seen in our clinic, the patient reports doing very well.  He is receiving XRT for prostate CA.  He has fatigue.  He is using his CPAP.  No afib.  Today, he denies symptoms of palpitations, chest pain, shortness of breath,  lower extremity edema, dizziness, presyncope, or syncope.  The patient is otherwise without complaint today.   Past Medical History:  Diagnosis Date  . Arthritis    "back, right knee, hands, ankles, neck" (05/31/2016)  . Carotid artery disease ([place]) 08/30/2016   Carotid US 11/19: R 1-39, L 40-59 // Carotid US 08/2019: R 1-39; L 40-59; repeat 1 year  . Chronic lower back pain   . Coronary atherosclerosis of native coronary artery    a. BMS to [facility] 2004 and 2007, otherwise mild nonobstructive disease. EF normal.  . Diverticulitis   . Dyslipidemia   . Essential hypertension, benign   . GERD (gastroesophageal reflux disease)   . Lumbar radiculopathy, chronic 02/04/2015   Right L5  . Obesity   . OSA on CPAP   . Paroxysmal atrial fibrillation ([place])    a. Discovered after stroke.  . Pneumonia 01/1996  . PONV (postoperative nausea and vomiting)   . Prostate CA ([place])   . Recurrent upper respiratory infection (URI)   . Visit for monitoring Tikosyn therapy 09/18/2019   Past Surgical History:  Procedure Laterality Date  . ANTERIOR CERVICAL DECOMP/DISCECTOMY FUSION  07/2001; 10/2002   "C5-6; C6-7; redo"  . BACK SURGERY    . CARPAL TUNNEL RELEASE Left 10/2015  . COLONOSCOPY W/ POLYPECTOMY  02/2014  . CORONARY ANGIOPLASTY WITH STENT PLACEMENT  05/2003; 12/2005   "mid RCA; mid RCA"  . HAND SURGERY  02/2019   LEFT HAND  . implantable loop recorder placement  02/27/2020   Medtronic Reveal [place] model [id number]  implantable loop recorder implanted by Dr Rayann Heman for afib management and  evaluation of presyncope  . JOINT REPLACEMENT    . KNEE ARTHROSCOPY Left 10/2005  . KNEE ARTHROSCOPY W/ PARTIAL MEDIAL MENISCECTOMY Left 09/2005  . LUMBAR LAMINECTOMY/DECOMPRESSION MICRODISCECTOMY  03/2005   "L4-5"  . POSTERIOR LUMBAR FUSION  10/2003   L5-S1; "plates, screws"  . SHOULDER ARTHROSCOPY Right 08/2011   Debridement of labrum, arthroscopic distal clavicle excision  . SHOULDER OPEN ROTATOR CUFF REPAIR Left 07/2014  . TEE WITHOUT CARDIOVERSION  07/07/2012   Procedure: TRANSESOPHAGEAL ECHOCARDIOGRAM (TEE);  Surgeon: Fay Records, MD;  Location: [facility];  Service: Cardiovascular;  Laterality: N/A;  . TONSILLECTOMY AND ADENOIDECTOMY  ~ 1956  . TOTAL KNEE ARTHROPLASTY Left 10/2006  . TRIGGER FINGER RELEASE Left 10/2015    ROS- all systems are reviewed and negatives except as per HPI above  Current Outpatient Medications  Medication Sig Dispense Refill  . acetaminophen (TYLENOL 8 HOUR ARTHRITIS PAIN) 650 MG CR tablet Take 1,300 mg by mouth as needed for pain (pain).     Marland Kitchen ALPRAZolam (XANAX) 0.5 MG tablet Take 1 tablet (0.5 mg total) by mouth as needed for anxiety (take one tablet 30 minutes prior to MRI and may repeat once, just prior to scan if needed). 2 tablet 0  . atorvastatin (LIPITOR) 40 MG tablet Take 1 tablet (40 mg total) by mouth daily. 90 tablet 3  . clindamycin (CLEOCIN) 300 MG capsule clindamycin HCl 300 mg capsule  TAKE 2 CAPSULES BY MOUTH 1 HOUR PRIOR TO DENTAL TREATMENT    . diltiazem (CARDIZEM) 30 MG tablet Take 1 tablet (30 mg total) by mouth 4 (four) times daily as needed. 60 tablet 6  . docusate sodium (COLACE) 100 MG capsule Take 100 mg by mouth 2 (two) times daily.    Marland Kitchen dofetilide (TIKOSYN) 500 MCG capsule TAKE 1 CAPSULE BY MOUTH TWICE A DAY 180 capsule 1  . fenofibrate 160 MG tablet TAKE 1 TABLET BY MOUTH EVERY DAY 90 tablet 3  . finasteride (PROSCAR) 5 MG tablet Take 5 mg by mouth daily.     . fluticasone (FLONASE) 50 MCG/ACT nasal spray Place 2 sprays into  the nose daily.     Marland Kitchen leuprolide, 6 Month, (ELIGARD) 45 MG injection Inject 45 mg into the skin every 6 (six) months.    . levocetirizine (XYZAL) 5 MG tablet Take 5 mg by mouth every evening.    Marland Kitchen lisinopril (ZESTRIL) 10 MG tablet Take 1 tablet (10 mg total) by mouth daily. 90 tablet 3  . loperamide (IMODIUM) 2 MG capsule Take 2 mg by mouth as needed for diarrhea or loose stools.    . magnesium oxide (MAG-OX) 400 MG tablet Take 1 tablet (400 mg total) by mouth daily. 90 tablet 3  . metoprolol succinate (TOPROL-XL) 50 MG 24 hr tablet Take 1 tablet (50 mg total) by mouth daily. Take with or immediately following a meal. 90 tablet 3  . Multiple Vitamins-Minerals (MULTIVITAMINS THER. W/MINERALS) TABS Take 1 tablet by mouth daily.      . nitroGLYCERIN (NITROSTAT) 0.4 MG SL tablet Place 1 tablet (0.4 mg total) under the tongue every 5 (five) minutes as needed for chest pain. 25 tablet 5  . omeprazole (PRILOSEC) 10 MG capsule Take 1 capsule (10 mg total) by mouth daily. 90 capsule 3  . potassium chloride (KLOR-CON) 10 MEQ tablet Take 1 tablet (10 mEq total) by mouth daily. Please make yearly appt with Dr. Acie Fredrickson for December for future refills. 1st attempt 90 tablet 1  . sildenafil (REVATIO) 20 MG tablet Take 2 to 5 tabs daily as needed for erectile dysfunction 30 tablet 2  . Tamsulosin HCl (FLOMAX) 0.4 MG CAPS Take 0.4 mg by mouth daily.      Marland Kitchen topiramate (TOPAMAX) 50 MG tablet Take 2 tablets (100 mg total) by mouth 2 (two) times daily. (Patient taking differently: Take 100 mg by mouth. Take 2 tablets by mouth in the AM and 1 tablet by mouth in the evening) 360 tablet 3  . XARELTO 20 MG TABS tablet TAKE 1 TABLET BY MOUTH EVERY DAY 90 tablet 3  . ipratropium (ATROVENT) 0.03 % nasal spray Place 1 spray into both nostrils in the morning and at bedtime.     No current facility-administered medications for this visit.    Physical Exam: Vitals:   06/25/20 1246  BP: 130/70  Pulse: 67  SpO2: 96%  Weight:  264 lb 12.8 oz (120.1 kg)  Height: 6\' 1"  (1.854 m)    GEN- The patient is overweight appearing, alert and oriented x 3 today.   Head- normocephalic, atraumatic Eyes-  Sclera clear, conjunctiva pink Ears- hearing intact Oropharynx- clear Lungs-   normal work of breathing Heart- Regular rate and rhythm  GI- soft  Extremities- no clubbing, cyanosis, or edema  Wt Readings from Last 3 Encounters:  06/25/20 264 lb 12.8 oz (120.1 kg)  05/13/20 271 lb 3.2 oz (123 kg)  03/17/20 275 lb (124.7 kg)  EKG tracing ordered today is personally reviewed and shows sinus rhythm 67 bpm, PR 188 msec, QTc 474msec  Assessment and Plan:  1. Paroxysmal atrial fibrillation afib burden is 0% by ILR (reviewed personally) He is doing well with tikosyn He requires close follow-up while on this medicine to avoid toxicity. We will check cbc, bmet, mg today Continue xarelto for chads2vasc score of 5  2. HTN Stable No change required today  3. Obesity Body mass index is 34.94 kg/m. Lifestyle modification is advised  4. OSA Compliant with CPAP advised,  Follows with Dr Brett Fairy.   5. Presyncope Resolved  Continue to monitor with ILR  6. Fatigue Wean toprol to off Cbc Reinforced need to use cpap  Risks, benefits and potential toxicities for medications prescribed and/or refilled reviewed with patient today.   AF clinic every 3 months Dr Acie Fredrickson in 6 months I am happy to see when needed  [place] Grayer MD, [facility] 06/25/2020 1:07 PM

## 2020-06-25 NOTE — Telephone Encounter (Signed)
Received voicemail message from patient requesting a return call. Phoned patient back to inquire.   Patient states, "I am worried I am dehydrated because I have lost 10-13 lb since starting radiation therapy." Patient denies feeling light headed or dizzy with standing. Patient endorses flushing his system with large amounts of water. Patient states, "I cut my diet way back." Explains he is no longer eating red meat and consumes only a very "clean diet." Reports increased urinary frequency. Denies a fluctuation in his heart rate and bp when changing from a sitting to standing position.Reports checking his orthostatic vitals multiple times during the day at home.   This RN explained that prostate radiation doesn't typically cause weight loss. Explained that most likely his weight loss is related to changes in his diet. Encouraged he continue to aggressively hydrate. Explained weight loss isn't recommended during radiation since the plan was created around his weight at consult. This RN explained how his bp and heart rate would fluctuate if he were dehydrated as well as symptoms associated with dehydration which he has none of.   This RN agreed to inform [employer], PA-C of these findings and phone the patient back with any additional instructions from her. Patient verbalized understanding of all reviewed and appreciation for the call back.   74 y.o. 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  Received 20/25 to prostate/pelvis with 0/15 intended for boost.

## 2020-06-25 NOTE — Telephone Encounter (Signed)
Phoned patient to circle back as promised following conversation this morning. No answer. Left voicemail message with my direct number requesting a return call.

## 2020-06-25 NOTE — Patient Instructions (Addendum)
Medication Instructions:  Reduce your Toprol 25 mg daily for 2 weeks. Then Stop   *If you need a refill on your cardiac medications before your next appointment, please call your pharmacy*  Lab Work: BMP, Mag, CBC   If you have labs (blood work) drawn today and your tests are completely normal, you will receive your results only by: Marland Kitchen MyChart Message (if you have MyChart) OR . A paper copy in the mail If you have any lab test that is abnormal or we need to change your treatment, we will call you to review the results.  Testing/Procedures: None ordered.  Follow-Up: At [facility], you and your health needs are our priority.  As part of our continuing mission to provide you with exceptional heart care, we have created designated Provider Care Teams.  These Care Teams include your primary Cardiologist (physician) and Advanced Practice Providers (APPs -  Physician Assistants and Nurse Practitioners) who all work together to provide you with the care you need, when you need it.  We recommend signing up for the patient portal called "MyChart".  Sign up information is provided on this After Visit Summary.  MyChart is used to connect with patients for Virtual Visits (Telemedicine).  Patients are able to view lab/test results, encounter notes, upcoming appointments, etc.  Non-urgent messages can be sent to your provider as well.   To learn more about what you can do with MyChart, go to [email or web address].    Your next appointment:   Your physician wants you to follow-up in: 3 months in the Afib Clinic. 6 months with Dr. Acie Fredrickson.    Other Instructions:

## 2020-06-25 NOTE — Telephone Encounter (Signed)
Sam, I agree completely. His weight loss is likely a result of the significant change in diet and while this may be a welcome change for him, if the weight loss continues, this may well have a negative impact on his radiation treatments resulting in the need for repeat CT SIM/planning to reset the treatment field... I would encourage that he continue to hydrate adequately which it sounds like he is doing a good job of and if he is going to continue to refrain from red meats, he will need to increase his protein intake thru other sources such as chicken, fish, pork. He may even need to add in a Boost/Ensure supplement daily and pay close attention to his caloric intake to make sure that he is getting in adequate calories daily.  If needed, we could connect him with the dieticians to discuss this further and create a successful plan for him.  Nicholos Johns, MMS, PA-C [facility]: [phone]  Fax: [phone]

## 2020-06-26 ENCOUNTER — Telehealth: Payer: Self-pay | Admitting: *Deleted

## 2020-06-26 ENCOUNTER — Ambulatory Visit
Admission: RE | Admit: 2020-06-26 | Discharge: 2020-06-26 | Disposition: A | Discharge status: Home / Self Care | Payer: Medicare PPO | Source: Ambulatory Visit | Attending: Radiation Oncology | Admitting: Radiation Oncology

## 2020-06-26 ENCOUNTER — Telehealth: Payer: Self-pay | Admitting: Radiation Oncology

## 2020-06-26 DIAGNOSIS — Z510 Encounter for antineoplastic radiation therapy: Secondary | ICD-10-CM | POA: Diagnosis not present

## 2020-06-26 DIAGNOSIS — C61 Malignant neoplasm of prostate: Secondary | ICD-10-CM | POA: Diagnosis not present

## 2020-06-26 LAB — BASIC METABOLIC PANEL
BUN/Creatinine Ratio: 16 (ref 10–24)
BUN: 19 mg/dL (ref 8–27)
CO2: 20 mmol/L (ref 20–29)
Calcium: 9.7 mg/dL (ref 8.6–10.2)
Chloride: 103 mmol/L (ref 96–106)
Creatinine, Ser: 1.19 mg/dL (ref 0.76–1.27)
GFR calc Af Amer: 70 mL/min/{1.73_m2} (ref >59)
GFR calc non Af Amer: 61 mL/min/{1.73_m2} (ref >59)
Glucose: 94 mg/dL (ref 65–99)
Potassium: 4.7 mmol/L (ref 3.5–5.2)
Sodium: 135 mmol/L (ref 134–144)

## 2020-06-26 LAB — CBC
Hematocrit: 35.6 % — ABNORMAL LOW (ref 37.5–51.0)
Hemoglobin: 12.3 g/dL — ABNORMAL LOW (ref 13.0–17.7)
MCH: 32.1 pg (ref 26.6–33.0)
MCHC: 34.6 g/dL (ref 31.5–35.7)
MCV: 93 fL (ref 79–97)
Platelets: 275 10*3/uL (ref 150–450)
RBC: 3.83 x10E6/uL — ABNORMAL LOW (ref 4.14–5.80)
RDW: 14.9 % (ref 11.6–15.4)
WBC: 5 10*3/uL (ref 3.4–10.8)

## 2020-06-26 LAB — MAGNESIUM: Magnesium: 1.9 mg/dL (ref 1.6–2.3)

## 2020-06-26 NOTE — Telephone Encounter (Signed)
Able to reach patient via phone after fourth attempt. Phoned patient to circle back about prior conversation. Explained the following per Ashlyn Bruning, PA-C: His weight loss is likely a result of the significant change in diet and while this may be a welcome change for him, if the weight loss continues, this may well have a negative impact on his radiation treatments resulting in the need for repeat CT SIM/planning to reset the treatment field... I would encourage that he continue to hydrate adequately which it sounds like he is doing a good job of and if he is going to continue to refrain from red meats, he will need to increase his protein intake thru other sources such as chicken, fish, pork. He may even need to add in a Boost/Ensure supplement daily and pay close attention to his caloric intake to make sure that he is getting in adequate calories daily.  If needed, we could connect him with the dieticians to discuss this further and create a successful plan for him.  Patient verbalized understanding of all reviewed. Patient requested nutrition appointment. Patient understands that Romie Jumper will be contact him with an appointment for a day before or after his radiation treatment.

## 2020-06-26 NOTE — Telephone Encounter (Signed)
CALLED PATIENT TO INFORM OF NUTRITION APPT. ON 07-07-20 @ 9 AM WITH BARBARA NEFF, SPOKE WITH PATIENT AND HE IS AWARE OF THIS APPT.

## 2020-06-26 NOTE — Telephone Encounter (Signed)
RETURNED PATIENT'S PHONE CALL, LVM FOR A RETURN CALL 

## 2020-06-27 ENCOUNTER — Ambulatory Visit: Payer: Medicare PPO

## 2020-06-30 ENCOUNTER — Other Ambulatory Visit: Payer: Self-pay

## 2020-06-30 ENCOUNTER — Ambulatory Visit
Admission: RE | Admit: 2020-06-30 | Discharge: 2020-06-30 | Disposition: A | Discharge status: Home / Self Care | Payer: Medicare PPO | Source: Ambulatory Visit | Attending: Radiation Oncology | Admitting: Radiation Oncology

## 2020-06-30 DIAGNOSIS — C61 Malignant neoplasm of prostate: Secondary | ICD-10-CM | POA: Diagnosis not present

## 2020-06-30 DIAGNOSIS — Z510 Encounter for antineoplastic radiation therapy: Secondary | ICD-10-CM | POA: Diagnosis not present

## 2020-07-01 ENCOUNTER — Ambulatory Visit
Admission: RE | Admit: 2020-07-01 | Discharge: 2020-07-01 | Disposition: A | Discharge status: Home / Self Care | Payer: Medicare PPO | Source: Ambulatory Visit | Attending: Radiation Oncology | Admitting: Radiation Oncology

## 2020-07-01 DIAGNOSIS — C61 Malignant neoplasm of prostate: Secondary | ICD-10-CM | POA: Diagnosis not present

## 2020-07-01 DIAGNOSIS — Z510 Encounter for antineoplastic radiation therapy: Secondary | ICD-10-CM | POA: Diagnosis not present

## 2020-07-02 ENCOUNTER — Ambulatory Visit
Admission: RE | Admit: 2020-07-02 | Discharge: 2020-07-02 | Disposition: A | Discharge status: Home / Self Care | Payer: Medicare PPO | Source: Ambulatory Visit | Attending: Radiation Oncology | Admitting: Radiation Oncology

## 2020-07-02 ENCOUNTER — Other Ambulatory Visit: Payer: Self-pay

## 2020-07-02 ENCOUNTER — Ambulatory Visit: Payer: Medicare PPO

## 2020-07-02 DIAGNOSIS — C61 Malignant neoplasm of prostate: Secondary | ICD-10-CM | POA: Diagnosis not present

## 2020-07-02 DIAGNOSIS — Z510 Encounter for antineoplastic radiation therapy: Secondary | ICD-10-CM | POA: Diagnosis not present

## 2020-07-02 NOTE — Progress Notes (Signed)
Received patient in the clinic following his treatment. Patient reports he "has had a horrible weekend." Reports urinary frequency making it seem as though he is "urinating every 30 minutes." Explains he had diarrhea on Friday that settled over the weekend and began again Sunday night running into today. Reports achieving some relief with Imodium. He explains he has a call into his GI doctor, Dr. Tristan Schroeder, for some insight.  Additionally, he is scheduled to meet with [facility] nutritionist on 07/07/2020 at 0900.   Assured the patient he is not orthostatic based on his vitals today. Encouraged Dr. Earlean Polka input. Encouraged patient to keep his appointment with the nutrition and he confirmed he would. Explained his radiation boost treatment will begin in 3 days, the radiation field will reduce greatly thus bowel irritation will as well. Patient verbalized understanding and expressed reassurance to know the end is insight reference his bowel symptoms.

## 2020-07-03 ENCOUNTER — Ambulatory Visit: Payer: Medicare PPO

## 2020-07-03 ENCOUNTER — Other Ambulatory Visit: Payer: Self-pay

## 2020-07-03 ENCOUNTER — Ambulatory Visit
Admission: RE | Admit: 2020-07-03 | Discharge: 2020-07-03 | Disposition: A | Discharge status: Home / Self Care | Payer: Medicare PPO | Source: Ambulatory Visit | Attending: Radiation Oncology | Admitting: Radiation Oncology

## 2020-07-03 VITALS — BP 122/74 | HR 73 | Temp 96.1°F | Resp 18 | Wt 260.6 lb

## 2020-07-03 DIAGNOSIS — C61 Malignant neoplasm of prostate: Secondary | ICD-10-CM

## 2020-07-03 DIAGNOSIS — Z510 Encounter for antineoplastic radiation therapy: Secondary | ICD-10-CM | POA: Diagnosis not present

## 2020-07-03 NOTE — Progress Notes (Signed)
Received patient in the clinic following his radiation treatment today. Wanted to circle back on our conversation Monday to access his current status. Patient seems much better today overall. He rates himself a 7 out of 10. Reports he has reduced his water intake some and added gatorade. Also, he reports holding fluids by 10 pm each night. Patient reports he slept for six hours last night uninterrupted. Noted his weight is down 4lb. Encouraged increased protein intake since excessive weight loss could negatively effect our ability to deliver radiation therapy. Patient verbalized understanding. Patient scheduled to see nutritionist on Monday. Reports that Dr. Tristan Schroeder (GI) wants him to pick up guiac cards tomorrow. He plans to follow up with Dr. Tristan Schroeder on October 22 to rule out radiation proctitis. Reports he switched from "regular Imodium to Imodium AD" and that has "really helped." At last, patient reports he is scheduled for an Eligard injection in November. Patient understands he will receive these injections every six month for two years. Over all the patient seems to be back on track. Patient scheduled to  follow up with Dr. Tammi Klippel tomorrow for his PUT encounter. No new needs identified at this time.

## 2020-07-04 ENCOUNTER — Ambulatory Visit
Admission: RE | Admit: 2020-07-04 | Discharge: 2020-07-04 | Disposition: A | Discharge status: Home / Self Care | Payer: Medicare PPO | Source: Ambulatory Visit | Attending: Radiation Oncology | Admitting: Radiation Oncology

## 2020-07-04 ENCOUNTER — Other Ambulatory Visit: Payer: Self-pay

## 2020-07-04 DIAGNOSIS — C61 Malignant neoplasm of prostate: Secondary | ICD-10-CM | POA: Insufficient documentation

## 2020-07-04 DIAGNOSIS — Z510 Encounter for antineoplastic radiation therapy: Secondary | ICD-10-CM | POA: Insufficient documentation

## 2020-07-06 DIAGNOSIS — G4733 Obstructive sleep apnea (adult) (pediatric): Secondary | ICD-10-CM | POA: Diagnosis not present

## 2020-07-07 ENCOUNTER — Other Ambulatory Visit: Payer: Self-pay

## 2020-07-07 ENCOUNTER — Ambulatory Visit
Admission: RE | Admit: 2020-07-07 | Discharge: 2020-07-07 | Disposition: A | Discharge status: Home / Self Care | Payer: Medicare PPO | Source: Ambulatory Visit | Attending: Radiation Oncology | Admitting: Radiation Oncology

## 2020-07-07 ENCOUNTER — Inpatient Hospital Stay: Payer: Medicare PPO | Attending: Radiation Oncology | Admitting: Nutrition

## 2020-07-07 DIAGNOSIS — C61 Malignant neoplasm of prostate: Secondary | ICD-10-CM | POA: Diagnosis not present

## 2020-07-07 DIAGNOSIS — Z510 Encounter for antineoplastic radiation therapy: Secondary | ICD-10-CM | POA: Diagnosis not present

## 2020-07-07 NOTE — Progress Notes (Signed)
74 year old male diagnosed with prostate cancer.  He is followed by Dr. Tammi Klippel.  He is scheduled to complete his final radiation therapy treatment on October 21.  Past medical history includes atrial fibrillation, GERD, and dyslipidemia.  Medications include Xanax, Lipitor, Colace, Imodium, magnesium oxide, and MVI.  Labs were reviewed.  Height: 6 feet 1 inches. Weight: 261.8 pounds on October 4. Usual body weight: 270-275 pounds. BMI: 34.54.  Patient reports he has lost 12 pounds since beginning radiation therapy.  He attributes this to dietary changes such as reducing fatty foods and avoiding red meat. He has been having some loose stools. He has been avoiding raw fruits and vegetables and trying to eat more rice. He has been drinking Ensure plant-based protein which only provides 180 cal and 20 g protein per serving. He has been told to increase iron-containing foods.  Nutrition diagnosis:  Food and nutrition related knowledge deficit related to prostate cancer and associated treatments as evidenced by no prior need for nutrition related information.  Intervention: Patient educated to continue small frequent meals and snacks with adequate calories and protein. Educated patient on strategies for improving diarrhea.  Provided fact sheet. Recommend patient try Ensure Enlive or Ensure complete to provide 350 cal per bottle.  I provided samples. Brief education provided on iron-containing foods and ways to enhance absorption.  Fact sheet given. Questions were answered.  Teach back method used and contact information given.  Monitoring, evaluation, goals: Patient will tolerate increased calories and protein to minimize weight loss.  Next visit: No follow-up has been scheduled.  Patient will contact me for questions or concerns.  **Disclaimer: This note was dictated with voice recognition software. Similar sounding words can inadvertently be transcribed and this note may contain  transcription errors which may not have been corrected upon publication of note.**

## 2020-07-08 ENCOUNTER — Ambulatory Visit
Admission: RE | Admit: 2020-07-08 | Discharge: 2020-07-08 | Disposition: A | Discharge status: Home / Self Care | Payer: Medicare PPO | Source: Ambulatory Visit | Attending: Radiation Oncology | Admitting: Radiation Oncology

## 2020-07-08 ENCOUNTER — Other Ambulatory Visit: Payer: Self-pay

## 2020-07-08 ENCOUNTER — Ambulatory Visit (INDEPENDENT_AMBULATORY_CARE_PROVIDER_SITE_OTHER): Payer: Medicare PPO

## 2020-07-08 DIAGNOSIS — R55 Syncope and collapse: Secondary | ICD-10-CM

## 2020-07-08 DIAGNOSIS — Z510 Encounter for antineoplastic radiation therapy: Secondary | ICD-10-CM | POA: Diagnosis not present

## 2020-07-08 DIAGNOSIS — C61 Malignant neoplasm of prostate: Secondary | ICD-10-CM | POA: Diagnosis not present

## 2020-07-08 LAB — CUP PACEART REMOTE DEVICE CHECK
Date Time Interrogation Session: 20211005094515
Implantable Pulse Generator Implant Date: 20210526

## 2020-07-09 ENCOUNTER — Ambulatory Visit
Admission: RE | Admit: 2020-07-09 | Discharge: 2020-07-09 | Disposition: A | Discharge status: Home / Self Care | Payer: Medicare PPO | Source: Ambulatory Visit | Attending: Radiation Oncology | Admitting: Radiation Oncology

## 2020-07-09 ENCOUNTER — Telehealth: Payer: Self-pay | Admitting: Radiation Oncology

## 2020-07-09 DIAGNOSIS — C61 Malignant neoplasm of prostate: Secondary | ICD-10-CM | POA: Diagnosis not present

## 2020-07-09 DIAGNOSIS — Z510 Encounter for antineoplastic radiation therapy: Secondary | ICD-10-CM | POA: Diagnosis not present

## 2020-07-09 NOTE — Telephone Encounter (Signed)
Received voicemail message from patient's wife, [name], requesting a return call. Phoned back to inquire. Renee questions if a celebration can be had when her husband "rings the bell" on his final day of treatment. Explained that due to COVID the bell has been taken down. Explained that with visitor restrictions their celebration will have to occur outside of the cancer center possibly in the healing garden. Renee verbalized understanding and appreciation for the call back.

## 2020-07-10 ENCOUNTER — Ambulatory Visit
Admission: RE | Admit: 2020-07-10 | Discharge: 2020-07-10 | Disposition: A | Discharge status: Home / Self Care | Payer: Medicare PPO | Source: Ambulatory Visit | Attending: Radiation Oncology | Admitting: Radiation Oncology

## 2020-07-10 DIAGNOSIS — Z510 Encounter for antineoplastic radiation therapy: Secondary | ICD-10-CM | POA: Diagnosis not present

## 2020-07-10 DIAGNOSIS — C61 Malignant neoplasm of prostate: Secondary | ICD-10-CM | POA: Diagnosis not present

## 2020-07-11 ENCOUNTER — Ambulatory Visit
Admission: RE | Admit: 2020-07-11 | Discharge: 2020-07-11 | Disposition: A | Discharge status: Home / Self Care | Payer: Medicare PPO | Source: Ambulatory Visit | Attending: Radiation Oncology | Admitting: Radiation Oncology

## 2020-07-11 DIAGNOSIS — Z510 Encounter for antineoplastic radiation therapy: Secondary | ICD-10-CM | POA: Diagnosis not present

## 2020-07-11 DIAGNOSIS — C61 Malignant neoplasm of prostate: Secondary | ICD-10-CM | POA: Diagnosis not present

## 2020-07-11 NOTE — Progress Notes (Signed)
Carelink Summary Report / Loop Recorder 

## 2020-07-14 ENCOUNTER — Ambulatory Visit
Admission: RE | Admit: 2020-07-14 | Discharge: 2020-07-14 | Disposition: A | Discharge status: Home / Self Care | Payer: Medicare PPO | Source: Ambulatory Visit | Attending: Radiation Oncology | Admitting: Radiation Oncology

## 2020-07-14 DIAGNOSIS — Z510 Encounter for antineoplastic radiation therapy: Secondary | ICD-10-CM | POA: Diagnosis not present

## 2020-07-14 DIAGNOSIS — C61 Malignant neoplasm of prostate: Secondary | ICD-10-CM | POA: Diagnosis not present

## 2020-07-15 ENCOUNTER — Ambulatory Visit
Admission: RE | Admit: 2020-07-15 | Discharge: 2020-07-15 | Disposition: A | Discharge status: Home / Self Care | Payer: Medicare PPO | Source: Ambulatory Visit | Attending: Radiation Oncology | Admitting: Radiation Oncology

## 2020-07-15 DIAGNOSIS — C61 Malignant neoplasm of prostate: Secondary | ICD-10-CM | POA: Diagnosis not present

## 2020-07-15 DIAGNOSIS — Z510 Encounter for antineoplastic radiation therapy: Secondary | ICD-10-CM | POA: Diagnosis not present

## 2020-07-16 ENCOUNTER — Ambulatory Visit: Payer: Medicare PPO

## 2020-07-16 ENCOUNTER — Telehealth: Payer: Self-pay | Admitting: Radiation Oncology

## 2020-07-16 DIAGNOSIS — R5383 Other fatigue: Secondary | ICD-10-CM | POA: Diagnosis not present

## 2020-07-16 NOTE — Telephone Encounter (Signed)
Phoned patient to follow up from conversation this morning. Per Corey Caldron, PA-C I advised he get in with his primary care provider as soon as possible or even Dr. Rayann Heman. Explained his profound weakness could be attributed to many different factors but dehydration and anemia should be ruled out. Explained fatigue is expected from prostate radiation and ADT but not at this level. Patient verbalized understanding and expressed his intent to phone his PCP after hanging up.

## 2020-07-16 NOTE — Telephone Encounter (Signed)
Per Romie Jumper, nursing secretary, this patient request this RN phone him. Phoned patient to inquire. Patient states, "I have not energy.Marland Kitchenall I want to do is lay in the bed." Patient reports his lack of energy began last then he received his COVID shot on Saturday and it got worse. Reports his wife drives him to and from his treatment appointments because "I am afraid I will pass out any minute." Reports when walking he feels like his knees could buckle anytime.   The patient reports eating small frequent meals. Reports drinking protein shakes and increasing protein intake after seeing nutritionist. Patient received initial ADT injection in June and second in December.   Actively receiving IMRT for prostate ca. He has received 45 intended Gy to his pelvis and 16 of 30 Gy of boost treatment.   Patient understands this RN will inform his providers of these findings and phone him back with further directions.

## 2020-07-16 NOTE — Telephone Encounter (Signed)
I would advise that he get in with his PCP for some labs this week, ASAP. Maybe you could call Dr. Jackalyn Lombard office to see if they can get him in tomorrow. The profound weakness/fatigue could be attributed to many different factors and radiation could certainly be one but this sounds more pronounced than I would expect with radiation fatigue alone. I worry that he may be dehydrated or anemic and I would like to keep him out of the ER if at all possible! -Deseree Zemaitis

## 2020-07-17 ENCOUNTER — Other Ambulatory Visit: Payer: Self-pay

## 2020-07-17 ENCOUNTER — Ambulatory Visit
Admission: RE | Admit: 2020-07-17 | Discharge: 2020-07-17 | Disposition: A | Discharge status: Home / Self Care | Payer: Medicare PPO | Source: Ambulatory Visit | Attending: Radiation Oncology | Admitting: Radiation Oncology

## 2020-07-17 ENCOUNTER — Other Ambulatory Visit: Payer: Self-pay | Admitting: Cardiovascular Disease

## 2020-07-17 DIAGNOSIS — Z510 Encounter for antineoplastic radiation therapy: Secondary | ICD-10-CM | POA: Diagnosis not present

## 2020-07-17 DIAGNOSIS — C61 Malignant neoplasm of prostate: Secondary | ICD-10-CM | POA: Diagnosis not present

## 2020-07-18 ENCOUNTER — Ambulatory Visit
Admission: RE | Admit: 2020-07-18 | Discharge: 2020-07-18 | Disposition: A | Discharge status: Home / Self Care | Payer: Medicare PPO | Source: Ambulatory Visit | Attending: Radiation Oncology | Admitting: Radiation Oncology

## 2020-07-18 DIAGNOSIS — C61 Malignant neoplasm of prostate: Secondary | ICD-10-CM | POA: Diagnosis not present

## 2020-07-18 DIAGNOSIS — Z510 Encounter for antineoplastic radiation therapy: Secondary | ICD-10-CM | POA: Diagnosis not present

## 2020-07-21 ENCOUNTER — Ambulatory Visit
Admission: RE | Admit: 2020-07-21 | Discharge: 2020-07-21 | Disposition: A | Discharge status: Home / Self Care | Payer: Medicare PPO | Source: Ambulatory Visit | Attending: Radiation Oncology | Admitting: Radiation Oncology

## 2020-07-21 DIAGNOSIS — C61 Malignant neoplasm of prostate: Secondary | ICD-10-CM | POA: Diagnosis not present

## 2020-07-21 DIAGNOSIS — Z510 Encounter for antineoplastic radiation therapy: Secondary | ICD-10-CM | POA: Diagnosis not present

## 2020-07-22 ENCOUNTER — Ambulatory Visit
Admission: RE | Admit: 2020-07-22 | Discharge: 2020-07-22 | Disposition: A | Discharge status: Home / Self Care | Payer: Medicare PPO | Source: Ambulatory Visit | Attending: Radiation Oncology | Admitting: Radiation Oncology

## 2020-07-22 ENCOUNTER — Ambulatory Visit: Payer: Medicare PPO

## 2020-07-22 DIAGNOSIS — E871 Hypo-osmolality and hyponatremia: Secondary | ICD-10-CM | POA: Diagnosis not present

## 2020-07-22 DIAGNOSIS — R5383 Other fatigue: Secondary | ICD-10-CM | POA: Diagnosis not present

## 2020-07-22 DIAGNOSIS — C61 Malignant neoplasm of prostate: Secondary | ICD-10-CM | POA: Diagnosis not present

## 2020-07-22 DIAGNOSIS — Z510 Encounter for antineoplastic radiation therapy: Secondary | ICD-10-CM | POA: Diagnosis not present

## 2020-07-23 ENCOUNTER — Ambulatory Visit
Admission: RE | Admit: 2020-07-23 | Discharge: 2020-07-23 | Disposition: A | Discharge status: Home / Self Care | Payer: Medicare PPO | Source: Ambulatory Visit | Attending: Radiation Oncology | Admitting: Radiation Oncology

## 2020-07-23 DIAGNOSIS — C61 Malignant neoplasm of prostate: Secondary | ICD-10-CM | POA: Diagnosis not present

## 2020-07-23 DIAGNOSIS — Z510 Encounter for antineoplastic radiation therapy: Secondary | ICD-10-CM | POA: Diagnosis not present

## 2020-07-24 ENCOUNTER — Ambulatory Visit
Admission: RE | Admit: 2020-07-24 | Discharge: 2020-07-24 | Disposition: A | Discharge status: Home / Self Care | Payer: Medicare PPO | Source: Ambulatory Visit | Attending: Radiation Oncology | Admitting: Radiation Oncology

## 2020-07-24 ENCOUNTER — Ambulatory Visit: Payer: Medicare PPO

## 2020-07-24 DIAGNOSIS — C61 Malignant neoplasm of prostate: Secondary | ICD-10-CM | POA: Diagnosis not present

## 2020-07-24 DIAGNOSIS — Z510 Encounter for antineoplastic radiation therapy: Secondary | ICD-10-CM | POA: Diagnosis not present

## 2020-07-25 ENCOUNTER — Encounter: Payer: Self-pay | Admitting: Urology

## 2020-07-25 ENCOUNTER — Ambulatory Visit
Admission: RE | Admit: 2020-07-25 | Discharge: 2020-07-25 | Disposition: A | Discharge status: Home / Self Care | Payer: Medicare PPO | Source: Ambulatory Visit | Attending: Radiation Oncology | Admitting: Radiation Oncology

## 2020-07-25 DIAGNOSIS — C61 Malignant neoplasm of prostate: Secondary | ICD-10-CM | POA: Diagnosis not present

## 2020-07-25 DIAGNOSIS — Z510 Encounter for antineoplastic radiation therapy: Secondary | ICD-10-CM | POA: Diagnosis not present

## 2020-07-26 ENCOUNTER — Other Ambulatory Visit: Payer: Self-pay | Admitting: Internal Medicine

## 2020-07-28 ENCOUNTER — Encounter: Payer: Self-pay | Admitting: Medical Oncology

## 2020-07-28 NOTE — Telephone Encounter (Signed)
Pt's pharmacy is requesting a refill on metoprolol. This medication states that pt is supposed to have taken it for 14 days. Does pt still supposed to be taking this medication? Please address

## 2020-07-30 DIAGNOSIS — R197 Diarrhea, unspecified: Secondary | ICD-10-CM | POA: Diagnosis not present

## 2020-08-04 DIAGNOSIS — G4733 Obstructive sleep apnea (adult) (pediatric): Secondary | ICD-10-CM | POA: Diagnosis not present

## 2020-08-04 MED ORDER — METOPROLOL SUCCINATE ER 25 MG PO TB24: 25.0000 mg | ORAL_TABLET | Freq: Every day | ORAL | 3 refills | Status: DC

## 2020-08-05 DIAGNOSIS — I10 Essential (primary) hypertension: Secondary | ICD-10-CM | POA: Diagnosis not present

## 2020-08-05 DIAGNOSIS — D72810 Lymphocytopenia: Secondary | ICD-10-CM | POA: Diagnosis not present

## 2020-08-05 DIAGNOSIS — A0472 Enterocolitis due to Clostridium difficile, not specified as recurrent: Secondary | ICD-10-CM | POA: Diagnosis not present

## 2020-08-05 DIAGNOSIS — E871 Hypo-osmolality and hyponatremia: Secondary | ICD-10-CM | POA: Diagnosis not present

## 2020-08-05 DIAGNOSIS — J4520 Mild intermittent asthma, uncomplicated: Secondary | ICD-10-CM | POA: Diagnosis not present

## 2020-08-06 DIAGNOSIS — G4733 Obstructive sleep apnea (adult) (pediatric): Secondary | ICD-10-CM | POA: Diagnosis not present

## 2020-08-11 ENCOUNTER — Ambulatory Visit: Payer: Medicare PPO | Admitting: Family Medicine

## 2020-08-11 ENCOUNTER — Ambulatory Visit (INDEPENDENT_AMBULATORY_CARE_PROVIDER_SITE_OTHER): Payer: Medicare PPO

## 2020-08-11 ENCOUNTER — Encounter: Payer: Self-pay | Admitting: Family Medicine

## 2020-08-11 ENCOUNTER — Other Ambulatory Visit: Payer: Self-pay

## 2020-08-11 VITALS — BP 140/76 | HR 73 | Ht 73.0 in | Wt 267.0 lb

## 2020-08-11 DIAGNOSIS — Z8673 Personal history of transient ischemic attack (TIA), and cerebral infarction without residual deficits: Secondary | ICD-10-CM

## 2020-08-11 DIAGNOSIS — G4733 Obstructive sleep apnea (adult) (pediatric): Secondary | ICD-10-CM | POA: Diagnosis not present

## 2020-08-11 DIAGNOSIS — R55 Syncope and collapse: Secondary | ICD-10-CM | POA: Diagnosis not present

## 2020-08-11 DIAGNOSIS — Z9989 Dependence on other enabling machines and devices: Secondary | ICD-10-CM

## 2020-08-11 LAB — CUP PACEART REMOTE DEVICE CHECK
Date Time Interrogation Session: 20211107085019
Implantable Pulse Generator Implant Date: 20210526

## 2020-08-11 NOTE — Progress Notes (Signed)
PATIENT: Corey [name] DOB: 02-17-1946  REASON FOR VISIT: follow up HISTORY FROM: patient  Chief Complaint  Patient presents with  . Sleep Apnea    rm 2  . Follow-up     HISTORY OF PRESENT ILLNESS: Today 08/11/20 Corey [name] is a 74 y.o. male here today for follow up for OSA on CPAP.  He was diagnosed years ago and has used CPAP therapy since.  New machine was ordered following updated sleep study over the summer.  He is doing well from a CPAP perspective.  He is using CPAP every night.  He denies any concerns with his machine or supplies.  He continues close follow up with PCP, cardiology and oncology. He continues Xarelto. No new stroke like symptoms. He recently finished radiation treatments for prostate cancer.  He has been monitored closely by his primary care provider for concerns of excessive fatigue.  Patient was diagnosed with anemia, uncertain etiology at this time.  He reports intermittent events where he feels as if his battery has been removed.  He describes an overwhelming sense of extreme fatigue.  He reports that he will slumped forward.  He has difficulty with imbalance.  He reports that he will bump into things randomly.  No falls.  He does not use an assistive device.  He does have a tremor that he notes when he is active or trying to reach for something.  Rarely notes a resting tremor.  He feels that his left hand is worse than his right.  No head tremor.  He reports that his twin brother was diagnosed with atypical parkinsonism per patient report.  He was seen by Dr. Rhunette Croft in 2019.  No obvious concerns of Parkinson's disease was noted on exam.  MRI was ordered but not completed by patient.  Compliance report dated 07/11/2020 through 08/09/2020 reveals that he used CPAP 30 of the past 30 days for compliance of 100%.  He used CPAP greater than 4 hours all 30 days for compliance of 100%.  Average usage on days used was 7 hours and 41 minutes.  Residual AHI was 1.0 on 5 to 15 cm  of water and an EPR of 3.  There was no significant leak noted.   HISTORY: (copied from  note on 02/11/2020)   Corey [name] a 74 year old Caucasian male patient and seen upon referralon 5/10/2021for a re-evaluation of OSA care- he was followed by pulmonology.  Dr Leonie Man and Stroke NP have seen the patient just on 4-14- and 4-19 for TIA work up and deferred to Dr. Rexene Alberts for Tremor and to me for OSA.    Chiefconcernaccording to patient :  Corey [name] reports that he was just recently diagnosed with prostate cancer after a biopsy showed 12 out of 12 needle biopsies positive for malignancy.  He has been followed for rising PSA over a year.  Dr. Estill Batten at [facility] urology is following him.  He is referred however by my stroke specialist colleague Dr. Leonie Man who was concerned that the patient has reported increasing fatigue and sleepiness in the daytime.  He has also followed with nurse practitioner macule history of essential tremors.  The patient reports ongoing nightly compliance with CPAP and has not a sleep study and probably 15 years or longer.  His current machine is 74 years old he does have documentation of his excellent compliance of 100% over the last 30 days by days and time.  Average use at time of 7  hours 32 minutes is using an air sense 10 AutoSet with a pressure window between 5 and 13 cmH2O 1 cm expiratory pressure relief, residual AHI 1.6/h of sleep no central apneas are emerging.  The median or 95th percentile pressure is 12.3 so he just straddles the top level of pressure needed and he has moderate air leaks.  He hates his mask and is very unhappy with [employer], DME- and has no communication-    He endorsed the fatigue severity score of 61 points on the Epworth sleepiness score at 8 out of 24 points the geriatric depression scale at 2 out of 15.  The patient also has a family history of Parkinson's affecting his brother..  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  He has been taking Xarelto at dinnertime, atrial fibrillation has been PD.  He had seen Dr. Rexene Alberts for a second opinion, who also felt that he had a essential tremor not Parkinson's disease.  Primidone made him too drowsy and somewhat "loopy" he experienced also fatigue and tiredness on Inderal a beta-blocker which then was discontinued and he has been taking topiramate for tremor control.  I have the pleasure of seeing Corey [name],a right -handed White or Caucasian male with OSA sleep disorder. He  has a past medical history of Arthritis, Carotid artery disease ([place]) (08/30/2016), Chronic lower back pain, Coronary atherosclerosis of native coronary artery, Diverticulitis, Dyslipidemia, Essential hypertension, benign, GERD (gastroesophageal reflux disease), Lumbar radiculopathy, chronic (02/04/2015), Obesity, OSA on CPAP, Paroxysmal atrial fibrillation ([place]), Pneumonia (01/1996), PONV (postoperative nausea and vomiting), Prostate CA ([place]), Recurrent upper respiratory infection (URI), and Visit for monitoring Tikosyn therapy (09/18/2019).Corey [name] history:brother with PD, atypical.   Social history:Patient is working as a Mudlogger for [employer], part time, and lives in a household with spouse, he has adult children. 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 Tobacco use remote, 2004. ETOH use infrequent,  Caffeine intake in form of Coffee( rare ) Soda( /) Tea ( /) nor energy drinks. Regular exercise in form of walking. Hobbies : travelling.   Sleep habits are as follows:The patient's dinner time is between 6.00 PM  and he takes Nutritional therapist.  The patient goes to bed at 11.30 PM and promptly goes to sleep- continues to sleep for 4 hours, wakes for 1-2 bathroom breaks.  The preferred sleep position is on the side , with the support of 2 pillows.  Dreams are reportedly frequent.   7 AM is the usual rise time. The patient wakes up 6- 7 spontaneously .  He reports not feeling refreshed or restored in AM, but rarely with symptoms such as dry mouth , morning headaches- there is residual fatigue.  Naps are taken infrequently, lasting after lunch time  from 10-25 minutes and feel refreshing.   REVIEW OF SYSTEMS: Out of a complete 14 system review of symptoms, the patient complains only of the following symptoms, tremor, imbalance, fatigue and all other reviewed systems are negative.  ALLERGIES: Allergies  Allergen Reactions  . Amoxicillin Swelling  . Fosinopril Other (See Comments)  . Hydromorphone Other (See Comments) and Nausea Only  . Ampicillin  Rash  . Codeine Nausea Only and Other (See Comments)    Heavy amounts cause nausea  . Monopril [Fosinopril Sodium] Other (See Comments)    Muscle aches & pain  . Percocet [Oxycodone-Acetaminophen] Nausea And Vomiting    HOME MEDICATIONS: Outpatient Medications Prior to Visit  Medication Sig Dispense Refill  . acetaminophen (TYLENOL 8 HOUR ARTHRITIS PAIN) 650 MG CR tablet Take 1,300 mg by mouth as needed for pain (pain).     Marland Kitchen ALPRAZolam (XANAX) 0.5 MG tablet Take 1 tablet (0.5 mg total) by mouth as needed for anxiety (take one tablet 30 minutes prior to MRI and may repeat once, just prior to scan if needed). 2 tablet 0  . atorvastatin (LIPITOR) 40 MG tablet TAKE 1 TABLET BY MOUTH EVERY DAY 90 tablet 2  . clindamycin (CLEOCIN) 300 MG capsule clindamycin HCl 300 mg capsule  TAKE 2 CAPSULES BY MOUTH 1 HOUR PRIOR TO DENTAL TREATMENT    . diltiazem (CARDIZEM) 30 MG tablet Take 1 tablet (30 mg total) by mouth 4 (four) times daily as needed. 60 tablet 6  . docusate  sodium (COLACE) 100 MG capsule Take 100 mg by mouth 2 (two) times daily.    Marland Kitchen dofetilide (TIKOSYN) 500 MCG capsule TAKE 1 CAPSULE BY MOUTH TWICE A DAY 180 capsule 1  . fenofibrate 160 MG tablet TAKE 1 TABLET BY MOUTH EVERY DAY 90 tablet 3  . fluticasone (FLONASE) 50 MCG/ACT nasal spray Place 2 sprays into the nose daily.     Marland Kitchen leuprolide, 6 Month, (ELIGARD) 45 MG injection Inject 45 mg into the skin every 6 (six) months.    . levocetirizine (XYZAL) 5 MG tablet Take 5 mg by mouth every evening.    Marland Kitchen lisinopril (ZESTRIL) 10 MG tablet Take 1 tablet (10 mg total) by mouth daily. (Patient taking differently: Take 10 mg by mouth daily. Not taking) 90 tablet 3  . loperamide (IMODIUM) 2 MG capsule Take 2 mg by mouth as needed for diarrhea or loose stools.    . magnesium oxide (MAG-OX) 400 MG tablet Take 1 tablet (400 mg total) by mouth daily. 90 tablet 3  . metoprolol succinate (TOPROL XL) 25 MG 24 hr tablet Take 1 tablet (25 mg total) by mouth daily. 90 tablet 3  . metroNIDAZOLE (FLAGYL) 500 MG tablet Take 500 mg by mouth 3 (three) times daily.    . Multiple Vitamins-Minerals (MULTIVITAMINS THER. W/MINERALS) TABS Take 1 tablet by mouth daily.      . nitroGLYCERIN (NITROSTAT) 0.4 MG SL tablet Place 1 tablet (0.4 mg total) under the tongue every 5 (five) minutes as needed for chest pain. 25 tablet 5  . omeprazole (PRILOSEC) 10 MG capsule Take 1 capsule (10 mg total) by mouth daily. 90 capsule 3  . potassium chloride (KLOR-CON) 10 MEQ tablet Take 1 tablet (10 mEq total) by mouth daily. Please make yearly appt with Dr. Acie Fredrickson for December for future refills. 1st attempt 90 tablet 1  . Tamsulosin HCl (FLOMAX) 0.4 MG CAPS Take 0.4 mg by mouth daily.      Marland Kitchen topiramate (TOPAMAX) 50 MG tablet Take 2 tablets (100 mg total) by mouth 2 (two) times daily. (Patient taking differently: Take 100 mg by mouth. Take 2 tablets by mouth in the AM and 1 tablet by mouth in the evening) 360 tablet 3  . XARELTO 20 MG TABS tablet  TAKE 1 TABLET BY MOUTH EVERY DAY 90 tablet 3  . ipratropium (ATROVENT) 0.03 % nasal spray Place 1 spray  into both nostrils in the morning and at bedtime.    . finasteride (PROSCAR) 5 MG tablet Take 5 mg by mouth daily.     . sildenafil (REVATIO) 20 MG tablet Take 2 to 5 tabs daily as needed for erectile dysfunction 30 tablet 2   No facility-administered medications prior to visit.    PAST MEDICAL HISTORY: Past Medical History:  Diagnosis Date  . Arthritis    "back, right knee, hands, ankles, neck" (05/31/2016)  . Carotid artery disease ([place]) 08/30/2016   Carotid US 11/19: R 1-39, L 40-59 // Carotid US 08/2019: R 1-39; L 40-59; repeat 1 year  . Chronic lower back pain   . Coronary atherosclerosis of native coronary artery    a. BMS to [facility] 2004 and 2007, otherwise mild nonobstructive disease. EF normal.  . Diverticulitis   . Dyslipidemia   . Essential hypertension, benign   . GERD (gastroesophageal reflux disease)   . Lumbar radiculopathy, chronic 02/04/2015   Right L5  . Obesity   . OSA on CPAP   . Paroxysmal atrial fibrillation ([place])    a. Discovered after stroke.  . Pneumonia 01/1996  . PONV (postoperative nausea and vomiting)   . Prostate CA ([place])   . Recurrent upper respiratory infection (URI)   . Visit for monitoring Tikosyn therapy 09/18/2019    PAST SURGICAL HISTORY: Past Surgical History:  Procedure Laterality Date  . ANTERIOR CERVICAL DECOMP/DISCECTOMY FUSION  07/2001; 10/2002   "C5-6; C6-7; redo"  . BACK SURGERY    . CARPAL TUNNEL RELEASE Left 10/2015  . COLONOSCOPY W/ POLYPECTOMY  02/2014  . CORONARY ANGIOPLASTY WITH STENT PLACEMENT  05/2003; 12/2005   "mid RCA; mid RCA"  . HAND SURGERY  02/2019   LEFT HAND  . implantable loop recorder placement  02/27/2020   Medtronic Reveal Lester model [id number]  implantable loop recorder implanted by Dr Rayann Heman for afib management and evaluation of presyncope  . JOINT REPLACEMENT    . KNEE ARTHROSCOPY Left 10/2005  . KNEE  ARTHROSCOPY W/ PARTIAL MEDIAL MENISCECTOMY Left 09/2005  . LUMBAR LAMINECTOMY/DECOMPRESSION MICRODISCECTOMY  03/2005   "L4-5"  . POSTERIOR LUMBAR FUSION  10/2003   L5-S1; "plates, screws"  . SHOULDER ARTHROSCOPY Right 08/2011   Debridement of labrum, arthroscopic distal clavicle excision  . SHOULDER OPEN ROTATOR CUFF REPAIR Left 07/2014  . TEE WITHOUT CARDIOVERSION  07/07/2012   Procedure: TRANSESOPHAGEAL ECHOCARDIOGRAM (TEE);  Surgeon: Fay Records, MD;  Location: [facility];  Service: Cardiovascular;  Laterality: N/A;  . TONSILLECTOMY AND ADENOIDECTOMY  ~ 1956  . TOTAL KNEE ARTHROPLASTY Left 10/2006  . TRIGGER FINGER RELEASE Left 10/2015    FAMILY HISTORY: Family History  Problem Relation Age of Onset  . Hypertension Mother   . Heart disease Father   . Heart attack Father   . Prostate cancer Brother   . Breast cancer Neg Hx   . Colon cancer Neg Hx   . Pancreatic cancer Neg Hx     SOCIAL HISTORY: Social History   Socioeconomic History  . Marital status: Married    Spouse name: [name]  . Number of children: 3  . Years of education: 97  . Highest education level: Not on file  Occupational History  . Occupation: Retired    Fish farm manager: DISABLED     Comment: Works at [employer] part time  Tobacco Use  . Smoking status: Former Smoker    Packs/day: 1.00    Years: 34.00    Pack years: 34.00  Types: Cigarettes    Quit date: 05/05/2003    Years since quitting: 17.2  . Smokeless tobacco: Never Used  Vaping Use  . Vaping Use: Never used  Substance and Sexual Activity  . Alcohol use: No    Alcohol/week: 0.0 standard drinks  . Drug use: No  . Sexual activity: Not Currently  Other Topics Concern  . Not on file  Social History Narrative   Patient lives at home with spouse.   Caffeine Use:    Social Determinants of Health   Financial Resource Strain:   . Difficulty of Paying Living Expenses: Not on file  Food Insecurity:   . Worried About Charity fundraiser in the Last  Year: Not on file  . Ran Out of Food in the Last Year: Not on file  Transportation Needs:   . Lack of Transportation (Medical): Not on file  . Lack of Transportation (Non-Medical): Not on file  Physical Activity:   . Days of Exercise per Week: Not on file  . Minutes of Exercise per Session: Not on file  Stress:   . Feeling of Stress : Not on file  Social Connections:   . Frequency of Communication with Friends and Family: Not on file  . Frequency of Social Gatherings with Friends and Family: Not on file  . Attends Religious Services: Not on file  . Active Member of Clubs or Organizations: Not on file  . Attends Archivist Meetings: Not on file  . Marital Status: Not on file  Intimate Partner Violence:   . Fear of Current or Ex-Partner: Not on file  . Emotionally Abused: Not on file  . Physically Abused: Not on file  . Sexually Abused: Not on file     PHYSICAL EXAM  Vitals:   08/11/20 1446  BP: 140/76  Pulse: 73  Weight: 267 lb (121.1 kg)  Height: 6\' 1"  (1.854 m)   Body mass index is 35.23 kg/m.  Generalized: Well developed, in no acute distress  Cardiology: normal rate and rhythm, no murmur noted Respiratory: clear to auscultation bilaterally  Neurological examination  Mentation: Alert oriented to time, place, history taking. Follows all commands speech and language fluent Cranial nerve II-XII: Pupils were equal round reactive to light. Extraocular movements were full, visual field were full  Motor: The motor testing reveals 5 over 5 strength of all 4 extremities. Good symmetric motor tone is noted throughout.  Very mild action tremor noted of right hand with coordination testing.  No bradykinesia noted.  No cogwheeling.  No difficulty with finger taps. Coordination: Patient is able to complete finger-to-nose and heel-to-shin without any difficulty. Gait and station: Gait is normal.  Reflexes: +1 in bilateral upper extremities and right patellar, left patellar  decreased, history of knee replacement.   DIAGNOSTIC DATA (LABS, IMAGING, TESTING) - I reviewed patient records, labs, notes, testing and imaging myself where available.  No flowsheet data found.   Lab Results  Component Value Date   WBC 5.0 06/25/2020   HGB 12.3 (L) 06/25/2020   HCT 35.6 (L) 06/25/2020   MCV 93 06/25/2020   PLT 275 06/25/2020      Component Value Date/Time   NA 135 06/25/2020 1336   K 4.7 06/25/2020 1336   CL 103 06/25/2020 1336   CO2 20 06/25/2020 1336   GLUCOSE 94 06/25/2020 1336   GLUCOSE 129 (H) 05/13/2020 1001   BUN 19 06/25/2020 1336   CREATININE 1.19 06/25/2020 1336   CREATININE 1.04 08/30/2016 0840  CALCIUM 9.7 06/25/2020 1336   PROT 6.9 09/20/2018 1004   ALBUMIN 4.4 09/20/2018 1004   AST 29 09/20/2018 1004   ALT 37 09/20/2018 1004   ALKPHOS 59 09/20/2018 1004   BILITOT 0.5 09/20/2018 1004   GFRNONAA 61 06/25/2020 1336   GFRAA 70 06/25/2020 1336   Lab Results  Component Value Date   CHOL 129 09/20/2018   HDL 38 (L) 09/20/2018   LDLCALC 75 09/20/2018   TRIG 81 09/20/2018   CHOLHDL 3.4 09/20/2018   Lab Results  Component Value Date   HGBA1C 5.9 (H) 07/07/2012   No results found for: VITAMINB12 Lab Results  Component Value Date   TSH 2.310 07/10/2019     ASSESSMENT AND PLAN 74 y.o. year old male  has a past medical history of Arthritis, Carotid artery disease ([place]) (08/30/2016), Chronic lower back pain, Coronary atherosclerosis of native coronary artery, Diverticulitis, Dyslipidemia, Essential hypertension, benign, GERD (gastroesophageal reflux disease), Lumbar radiculopathy, chronic (02/04/2015), Obesity, OSA on CPAP, Paroxysmal atrial fibrillation ([place]), Pneumonia (01/1996), PONV (postoperative nausea and vomiting), Prostate CA ([place]), Recurrent upper respiratory infection (URI), and Visit for monitoring Tikosyn therapy (09/18/2019). here with     ICD-10-CM   1. OSA on CPAP  G47.33    Z99.89   2. History of stroke  Z86.73      Corey [name] is doing well on CPAP therapy.  Compliance report reveals excellent compliance.  He was encouraged to continue using CPAP nightly and for greater than 4 hours each night. We will update supply orders as indicated. Risks of untreated sleep apnea review and education materials provided.  He will follow-up closely with primary care for management of co morbidities and stroke prevention. He has follow up with Frann Rider on 09/22/2020.  We have discussed his concerns of tremor, imbalance and periods of excessive fatigue.  No obvious neurological deficits on exam to support parkinsonism with today's visit, however, he was advised to consider second opinion if he wishes.  I have suggested he consider second opinion with an academic center.  Healthy lifestyle habits encouraged. He will follow up in 1 year, sooner if needed. He verbalizes understanding and agreement with this plan.    No orders of the defined types were placed in this encounter.    No orders of the defined types were placed in this encounter.     I spent 15 minutes with the patient. 50% of this time was spent counseling and educating patient on plan of care and medications.    Debbora Presto, FNP-C 08/11/2020, 3:31 PM [facility] [address], [place], [place] [address] [phone]

## 2020-08-11 NOTE — Patient Instructions (Signed)
Please continue using your CPAP regularly. While your insurance requires that you use CPAP at least 4 hours each night on 70% of the nights, I recommend, that you not skip any nights and use it throughout the night if you can. Getting used to CPAP and staying with the treatment long term does take time and patience and discipline. Untreated obstructive sleep apnea when it is moderate to severe can have an adverse impact on cardiovascular health and raise her risk for heart disease, arrhythmias, hypertension, congestive heart failure, stroke and diabetes. Untreated obstructive sleep apnea causes sleep disruption, nonrestorative sleep, and sleep deprivation. This can have an impact on your day to day functioning and cause daytime sleepiness and impairment of cognitive function, memory loss, mood disturbance, and problems focussing. Using CPAP regularly can improve these symptoms.   Follow up with Jessica in 09/2020, follow up for CPAP review in 1 year     Stroke Prevention Some medical conditions and lifestyle choices can lead to a higher risk for a stroke. You can help to prevent a stroke by making nutrition, lifestyle, and other changes. What nutrition changes can be made?   Eat healthy foods. ? Choose foods that are high in fiber. These include:  Fresh fruits.  Fresh vegetables.  Whole grains. ? Eat at least 5 or more servings of fruits and vegetables each day. Try to fill half of your plate at each meal with fruits and vegetables. ? Choose lean protein foods. These include:  Lowfat (lean) cuts of meat.  Chicken without skin.  Fish.  Tofu.  Beans.  Nuts. ? Eat low-fat dairy products. ? Avoid foods that:  Are high in salt (sodium).  Have saturated fat.  Have trans fat.  Have cholesterol.  Are processed.  Are premade.  Follow eating guidelines as told by your doctor. These may include: ? Reducing how many calories you eat and drink each day. ? Limiting how much salt  you eat or drink each day to 1,500 milligrams (mg). ? Using only healthy fats for cooking. These include:  Olive oil.  Canola oil.  Sunflower oil. ? Counting how many carbohydrates you eat and drink each day. What lifestyle changes can be made?  Try to stay at a healthy weight. Talk to your doctor about what a good weight is for you.  Get at least 30 minutes of moderate physical activity at least 5 days a week. This can include: ? Fast walking. ? Biking. ? Swimming.  Do not use any products that have nicotine or tobacco. This includes cigarettes and e-cigarettes. If you need help quitting, ask your doctor. Avoid being around tobacco smoke in general.  Limit how much alcohol you drink to no more than 1 drink a day for nonpregnant women and 2 drinks a day for men. One drink equals 12 oz of beer, 5 oz of wine, or 1 oz of hard liquor.  Do not use drugs.  Avoid taking birth control pills. Talk to your doctor about the risks of taking birth control pills if: ? You are over 66 years old. ? You smoke. ? You get migraines. ? You have had a blood clot. What other changes can be made?  Manage your cholesterol. ? It is important to eat a healthy diet. ? If your cholesterol cannot be managed through your diet, you may also need to take medicines. Take medicines as told by your doctor.  Manage your diabetes. ? It is important to eat a healthy diet and  to exercise regularly. ? If your blood sugar cannot be managed through diet and exercise, you may need to take medicines. Take medicines as told by your doctor.  Control your high blood pressure (hypertension). ? Try to keep your blood pressure below 130/80. This can help lower your risk of stroke. ? It is important to eat a healthy diet and to exercise regularly. ? If your blood pressure cannot be managed through diet and exercise, you may need to take medicines. Take medicines as told by your doctor. ? Ask your doctor if you should check  your blood pressure at home. ? Have your blood pressure checked every year. Do this even if your blood pressure is normal.  Talk to your doctor about getting checked for a sleep disorder. Signs of this can include: ? Snoring a lot. ? Feeling very tired.  Take over-the-counter and prescription medicines only as told by your doctor. These may include aspirin or blood thinners (antiplatelets or anticoagulants).  Make sure that any other medical conditions you have are managed. Where to find more information  American Stroke Association: www.strokeassociation.org  National Stroke Association: www.stroke.org Get help right away if:  You have any symptoms of stroke. "BE FAST" is an easy way to remember the main warning signs: ? B - Balance. Signs are dizziness, sudden trouble walking, or loss of balance. ? E - Eyes. Signs are trouble seeing or a sudden change in how you see. ? F - Face. Signs are sudden weakness or loss of feeling of the face, or the face or eyelid drooping on one side. ? A - Arms. Signs are weakness or loss of feeling in an arm. This happens suddenly and usually on one side of the body. ? S - Speech. Signs are sudden trouble speaking, slurred speech, or trouble understanding what people say. ? T - Time. Time to call emergency services. Write down what time symptoms started.  You have other signs of stroke, such as: ? A sudden, very bad headache with no known cause. ? Feeling sick to your stomach (nausea). ? Throwing up (vomiting). ? Jerky movements you cannot control (seizure). These symptoms may represent a serious problem that is an emergency. Do not wait to see if the symptoms will go away. Get medical help right away. Call your local emergency services (911 in the [place] Do not drive yourself to the hospital. Summary  You can prevent a stroke by eating healthy, exercising, not smoking, drinking less alcohol, and treating other health problems, such as diabetes, high  blood pressure, or high cholesterol.  Do not use any products that contain nicotine or tobacco, such as cigarettes and e-cigarettes.  Get help right away if you have any signs or symptoms of a stroke. This information is not intended to replace advice given to you by your health care provider. Make sure you discuss any questions you have with your health care provider. Document Revised: 11/16/2018 Document Reviewed: 12/22/2016 Elsevier Patient Education  [place].   Sleep Apnea Sleep apnea affects breathing during sleep. It causes breathing to stop for a short time or to become shallow. It can also increase the risk of:  Heart attack.  Stroke.  Being very overweight (obese).  Diabetes.  Heart failure.  Irregular heartbeat. The goal of treatment is to help you breathe normally again. What are the causes? There are three kinds of sleep apnea:  Obstructive sleep apnea. This is caused by a blocked or collapsed airway.  Central sleep  apnea. This happens when the brain does not send the right signals to the muscles that control breathing.  Mixed sleep apnea. This is a combination of obstructive and central sleep apnea. The most common cause of this condition is a collapsed or blocked airway. This can happen if:  Your throat muscles are too relaxed.  Your tongue and tonsils are too large.  You are overweight.  Your airway is too small. What increases the risk?  Being overweight.  Smoking.  Having a small airway.  Being older.  Being male.  Drinking alcohol.  Taking medicines to calm yourself (sedatives or tranquilizers).  Having family members with the condition. What are the signs or symptoms?  Trouble staying asleep.  Being sleepy or tired during the day.  Getting angry a lot.  Loud snoring.  Headaches in the morning.  Not being able to focus your mind (concentrate).  Forgetting things.  Less interest in sex.  Mood  swings.  Personality changes.  Feelings of sadness (depression).  Waking up a lot during the night to pee (urinate).  Dry mouth.  Sore throat. How is this diagnosed?  Your medical history.  A physical exam.  A test that is done when you are sleeping (sleep study). The test is most often done in a sleep lab but may also be done at home. How is this treated?   Sleeping on your side.  Using a medicine to get rid of mucus in your nose (decongestant).  Avoiding the use of alcohol, medicines to help you relax, or certain pain medicines (narcotics).  Losing weight, if needed.  Changing your diet.  Not smoking.  Using a machine to open your airway while you sleep, such as: ? An oral appliance. This is a mouthpiece that shifts your lower jaw forward. ? A CPAP device. This device blows air through a mask when you breathe out (exhale). ? An EPAP device. This has valves that you put in each nostril. ? A BPAP device. This device blows air through a mask when you breathe in (inhale) and breathe out.  Having surgery if other treatments do not work. It is important to get treatment for sleep apnea. Without treatment, it can lead to:  High blood pressure.  Coronary artery disease.  In men, not being able to have an erection (impotence).  Reduced thinking ability. Follow these instructions at home: Lifestyle  Make changes that your doctor recommends.  Eat a healthy diet.  Lose weight if needed.  Avoid alcohol, medicines to help you relax, and some pain medicines.  Do not use any products that contain nicotine or tobacco, such as cigarettes, e-cigarettes, and chewing tobacco. If you need help quitting, ask your doctor. General instructions  Take over-the-counter and prescription medicines only as told by your doctor.  If you were given a machine to use while you sleep, use it only as told by your doctor.  If you are having surgery, make sure to tell your doctor you  have sleep apnea. You may need to bring your device with you.  Keep all follow-up visits as told by your doctor. This is important. Contact a doctor if:  The machine that you were given to use during sleep bothers you or does not seem to be working.  You do not get better.  You get worse. Get help right away if:  Your chest hurts.  You have trouble breathing in enough air.  You have an uncomfortable feeling in your back, arms, or  stomach.  You have trouble talking.  One side of your body feels weak.  A part of your face is hanging down. These symptoms may be an emergency. Do not wait to see if the symptoms will go away. Get medical help right away. Call your local emergency services (911 in the [place] Do not drive yourself to the hospital. Summary  This condition affects breathing during sleep.  The most common cause is a collapsed or blocked airway.  The goal of treatment is to help you breathe normally while you sleep. This information is not intended to replace advice given to you by your health care provider. Make sure you discuss any questions you have with your health care provider. Document Revised: 07/07/2018 Document Reviewed: 05/16/2018 Elsevier Patient Education  [place].

## 2020-08-11 NOTE — Progress Notes (Signed)
Carelink Summary Report / Loop Recorder 

## 2020-08-13 DIAGNOSIS — A0472 Enterocolitis due to Clostridium difficile, not specified as recurrent: Secondary | ICD-10-CM | POA: Diagnosis not present

## 2020-08-13 DIAGNOSIS — K627 Radiation proctitis: Secondary | ICD-10-CM | POA: Diagnosis not present

## 2020-08-13 DIAGNOSIS — R197 Diarrhea, unspecified: Secondary | ICD-10-CM | POA: Diagnosis not present

## 2020-08-19 ENCOUNTER — Ambulatory Visit (HOSPITAL_COMMUNITY)
Admission: RE | Admit: 2020-08-19 | Discharge: 2020-08-19 | Disposition: A | Discharge status: Home / Self Care | Payer: Medicare PPO | Source: Ambulatory Visit | Attending: Cardiovascular Disease | Admitting: Cardiovascular Disease

## 2020-08-19 ENCOUNTER — Other Ambulatory Visit: Payer: Self-pay

## 2020-08-19 ENCOUNTER — Other Ambulatory Visit (HOSPITAL_COMMUNITY): Payer: Self-pay | Admitting: Physician Assistant

## 2020-08-19 DIAGNOSIS — I6523 Occlusion and stenosis of bilateral carotid arteries: Secondary | ICD-10-CM | POA: Insufficient documentation

## 2020-08-22 ENCOUNTER — Encounter: Payer: Self-pay | Admitting: Physician Assistant

## 2020-08-25 ENCOUNTER — Telehealth: Payer: Self-pay | Admitting: Radiation Oncology

## 2020-08-25 NOTE — Telephone Encounter (Signed)
Mr. Bovey called and I confirmed with him that Wednesday's follow up with Freeman Caldron, PA at 9am will be by phone. He was very agreeable to this. Sending [employer], PA a f/u msg on this as well.

## 2020-08-26 DIAGNOSIS — R0981 Nasal congestion: Secondary | ICD-10-CM | POA: Diagnosis not present

## 2020-08-26 DIAGNOSIS — J309 Allergic rhinitis, unspecified: Secondary | ICD-10-CM | POA: Diagnosis not present

## 2020-08-26 DIAGNOSIS — R059 Cough, unspecified: Secondary | ICD-10-CM | POA: Diagnosis not present

## 2020-08-26 DIAGNOSIS — R0982 Postnasal drip: Secondary | ICD-10-CM | POA: Diagnosis not present

## 2020-08-26 NOTE — Progress Notes (Signed)
  Radiation Oncology         [phone]) [phone] ________________________________  Name: Corey [name] MRN: [id number]  Date: 07/25/2020  DOB: October 19, 1945  End of Treatment Note  Diagnosis:   74 y.o. 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     Indication for treatment:  Curative, Definitive Radiotherapy       Radiation treatment dates:   05/27/20 - 07/25/20; concurrent with ADT (started 03/11/20)  Site/dose:  1. The prostate, seminal vesicles, and pelvic lymph nodes were initially treated to 45 Gy in 25 fractions of 1.8 Gy  2. The prostate only was boosted to 75 Gy with 15 additional fractions of 2.0 Gy   Beams/energy:  1. The prostate, seminal vesicles, and pelvic lymph nodes were initially treated using VMAT intensity modulated radiotherapy delivering 6 megavolt photons. Image guidance was performed with CB-CT studies prior to each fraction. He was immobilized with a body fix lower extremity mold.  2. the prostate only was boosted using VMAT intensity modulated radiotherapy delivering 6 megavolt photons. Image guidance was performed with CB-CT studies prior to each fraction. He was immobilized with a body fix lower extremity mold.  Narrative: The patient tolerated radiation treatment relatively well with only minor urinary irritation and modest fatigue.  He did develop increased nocturia and mild dysuria but specifically denied any gross hematuria, straining to void, incomplete bladder emptying or incontinence.  He did have some relatively profound fatigue in the last 2 to 3 weeks of treatment but this was improved after seeing his PCP who stopped his BP meds and had him increase the sodium in his diet.  He also had some occasional loose stool/diarrhea which was managed with Imodium as needed and had resolved prior to completion of treatment.  Plan: The patient has completed radiation treatment. He will return to  radiation oncology clinic for routine followup in one month. I advised him to call or return sooner if he has any questions or concerns related to his recovery or treatment. ________________________________  Sheral Apley. Tammi Klippel, M.D.

## 2020-08-26 NOTE — Progress Notes (Addendum)
Radiation Oncology         [phone] ________________________________  Name: Corey [name] MRN: [id number]  Date: 08/27/2020  DOB: 01/19/1946  Post Treatment Note  CC: [name], MD  Festus Aloe, MD  Diagnosis:   74 y.o. 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  Interval Since Last Radiation:  4.5 weeks  05/27/20 - 07/25/20; concurrent with ADT (started 03/11/20) 1. The prostate, seminal vesicles, and pelvic lymph nodes were initially treated to 45 Gy in 25 fractions of 1.8 Gy  2. The prostate only was boosted to 75 Gy with 15 additional fractions of 2.0 Gy    Narrative:  I spoke with the patient to conduct his routine scheduled 1 month follow up visit via telephone to spare the patient unnecessary potential exposure in the healthcare setting during the current COVID-19 pandemic.  The patient was notified in advance and gave permission to proceed with this visit format.  He tolerated radiation treatment relatively well with only minor urinary irritation and modest fatigue.  He did develop increased nocturia and mild dysuria but specifically denied any gross hematuria, straining to void, incomplete bladder emptying or incontinence.  He did have some relatively profound fatigue in the last 2 to 3 weeks of treatment but this was improved after seeing his PCP who stopped his BP meds and had him increase the sodium in his diet.  He also had some occasional loose stool/diarrhea which was managed with Imodium as needed and had resolved prior to completion of treatment.                              On review of systems, the patient states that he is doing relatively well in general.  He continues with some mild increased urgency and nocturia 3-4 times per night but has had resolution of the dysuria.  His current IPSS score is 15, indicating moderate urinary symptoms.  He specifically denies gross  hematuria, straining to void, incomplete bladder emptying or incontinence.  Shortly after completing radiation, he was diagnosed with C. difficile colitis and finished a course of antibiotics with resolution of the diarrhea.  He has continued with intermittently significant fatigue likely associated with multiple factors including his ADT, recent C. difficile colitis, persistent anemia and now, current upper respiratory infection.  He denies any recent fevers, chills, nausea, vomiting or night sweats.  ALLERGIES:  is allergic to amoxicillin, fosinopril, hydromorphone, ampicillin, codeine, monopril [fosinopril sodium], and percocet [oxycodone-acetaminophen].  Meds: Current Outpatient Medications  Medication Sig Dispense Refill  . acetaminophen (TYLENOL 8 HOUR ARTHRITIS PAIN) 650 MG CR tablet Take 1,300 mg by mouth as needed for pain (pain).     Marland Kitchen ALPRAZolam (XANAX) 0.5 MG tablet Take 1 tablet (0.5 mg total) by mouth as needed for anxiety (take one tablet 30 minutes prior to MRI and may repeat once, just prior to scan if needed). 2 tablet 0  . atorvastatin (LIPITOR) 40 MG tablet TAKE 1 TABLET BY MOUTH EVERY DAY 90 tablet 2  . clindamycin (CLEOCIN) 300 MG capsule clindamycin HCl 300 mg capsule  TAKE 2 CAPSULES BY MOUTH 1 HOUR PRIOR TO DENTAL TREATMENT    . diltiazem (CARDIZEM) 30 MG tablet Take 1 tablet (30 mg total) by mouth 4 (four) times daily as needed. 60 tablet 6  . docusate sodium (COLACE) 100 MG capsule Take 100 mg by  mouth 2 (two) times daily.    Marland Kitchen dofetilide (TIKOSYN) 500 MCG capsule TAKE 1 CAPSULE BY MOUTH TWICE A DAY 180 capsule 1  . fenofibrate 160 MG tablet TAKE 1 TABLET BY MOUTH EVERY DAY 90 tablet 3  . fluticasone (FLONASE) 50 MCG/ACT nasal spray Place 2 sprays into the nose daily.     Marland Kitchen ipratropium (ATROVENT) 0.03 % nasal spray Place 1 spray into both nostrils in the morning and at bedtime.    Marland Kitchen leuprolide, 6 Month, (ELIGARD) 45 MG injection Inject 45 mg into the skin every 6 (six)  months.    . levocetirizine (XYZAL) 5 MG tablet Take 5 mg by mouth every evening.    Marland Kitchen lisinopril (ZESTRIL) 10 MG tablet Take 1 tablet (10 mg total) by mouth daily. (Patient taking differently: Take 10 mg by mouth daily. Not taking) 90 tablet 3  . loperamide (IMODIUM) 2 MG capsule Take 2 mg by mouth as needed for diarrhea or loose stools.    . magnesium oxide (MAG-OX) 400 MG tablet Take 1 tablet (400 mg total) by mouth daily. 90 tablet 3  . metoprolol succinate (TOPROL XL) 25 MG 24 hr tablet Take 1 tablet (25 mg total) by mouth daily. 90 tablet 3  . metroNIDAZOLE (FLAGYL) 500 MG tablet Take 500 mg by mouth 3 (three) times daily.    . Multiple Vitamins-Minerals (MULTIVITAMINS THER. W/MINERALS) TABS Take 1 tablet by mouth daily.      . nitroGLYCERIN (NITROSTAT) 0.4 MG SL tablet Place 1 tablet (0.4 mg total) under the tongue every 5 (five) minutes as needed for chest pain. 25 tablet 5  . omeprazole (PRILOSEC) 10 MG capsule Take 1 capsule (10 mg total) by mouth daily. 90 capsule 3  . potassium chloride (KLOR-CON) 10 MEQ tablet Take 1 tablet (10 mEq total) by mouth daily. Please make yearly appt with Dr. Acie Fredrickson for December for future refills. 1st attempt 90 tablet 1  . Tamsulosin HCl (FLOMAX) 0.4 MG CAPS Take 0.4 mg by mouth daily.      Marland Kitchen topiramate (TOPAMAX) 50 MG tablet Take 2 tablets (100 mg total) by mouth 2 (two) times daily. (Patient taking differently: Take 100 mg by mouth. Take 2 tablets by mouth in the AM and 1 tablet by mouth in the evening) 360 tablet 3  . XARELTO 20 MG TABS tablet TAKE 1 TABLET BY MOUTH EVERY DAY 90 tablet 3   No current facility-administered medications for this visit.    Physical Findings:  vitals were not taken for this visit.   /Unable to assess due to telephone follow-up visit format.  Lab Findings: Lab Results  Component Value Date   WBC 5.0 06/25/2020   HGB 12.3 (L) 06/25/2020   HCT 35.6 (L) 06/25/2020   MCV 93 06/25/2020   PLT 275 06/25/2020      Radiographic Findings: CUP PACEART REMOTE DEVICE CHECK  Result Date: 08/11/2020 ILR summary report received. Battery status OK. Normal device function. No new symptom, tachy, brady, or pause episodes. No new AF episodes. Monthly summary reports and ROV/PRN  VAS US CAROTID  Result Date: 08/21/2020 Carotid Arterial Duplex Study Indications:       Bilateral carotid artery stenosis. Patient c/o headaches,                    dizziness and abnormal gait especially when turning to either                    side since February  2021. He denies any other cerebrovascular                    symptoms. Risk Factors:      Hypertension, hyperlipidemia, past history of smoking,                    coronary artery disease, prior CVA. Comparison Study:  In 08/2019, a carotid artery duplex showed velocities of                    57/16 cm/s in the RICA and 003/49 cm/s in the LICA. Performing Technologist: Sharlett Iles RVT  Examination Guidelines: A complete evaluation includes B-mode imaging, spectral Doppler, color Doppler, and power Doppler as needed of all accessible portions of each vessel. Bilateral testing is considered an integral part of a complete examination. Limited examinations for reoccurring indications may be performed as noted.  Right Carotid Findings: +----------+--------+--------+--------+------------------+--------+           PSV cm/sEDV cm/sStenosisPlaque DescriptionComments +----------+--------+--------+--------+------------------+--------+ CCA Prox  109     8                                          +----------+--------+--------+--------+------------------+--------+ CCA Distal42      9               heterogenous               +----------+--------+--------+--------+------------------+--------+ ICA Prox  36      11              heterogenous               +----------+--------+--------+--------+------------------+--------+ ICA Mid   39      12      1-39%                               +----------+--------+--------+--------+------------------+--------+ ICA Distal48      17                                         +----------+--------+--------+--------+------------------+--------+ ECA       71      6               heterogenous               +----------+--------+--------+--------+------------------+--------+ +----------+--------+-------+----------------+-------------------+           PSV cm/sEDV cmsDescribe        Arm Pressure (mmHG) +----------+--------+-------+----------------+-------------------+ [id number]            Multiphasic, [id number]                 +----------+--------+-------+----------------+-------------------+ +---------+--------+--+--------+--+---------+ VertebralPSV cm/s63EDV cm/s15Antegrade +---------+--------+--+--------+--+---------+ Velocities in the RICA remain within normal range and have decreased compared to the prior exam. Left Carotid Findings: +----------+--------+--------+--------+------------------+--------+           PSV cm/sEDV cm/sStenosisPlaque DescriptionComments +----------+--------+--------+--------+------------------+--------+ CCA Prox  104     17                                tortuous +----------+--------+--------+--------+------------------+--------+ CCA Mid   66      15  heterogenous               +----------+--------+--------+--------+------------------+--------+ CCA Distal56      15              heterogenous               +----------+--------+--------+--------+------------------+--------+ ICA Prox  163     49      40-59%  heterogenous               +----------+--------+--------+--------+------------------+--------+ ICA Mid   157     45              heterogenous               +----------+--------+--------+--------+------------------+--------+ ICA Distal65      21                                tortuous  +----------+--------+--------+--------+------------------+--------+ ECA       59      6               heterogenous               +----------+--------+--------+--------+------------------+--------+ +----------+--------+--------+----------------+-------------------+           PSV cm/sEDV cm/sDescribe        Arm Pressure (mmHG) +----------+--------+--------+----------------+-------------------+ Subclavian119             Multiphasic, [id number]                 +----------+--------+--------+----------------+-------------------+ +---------+--------+--+--------+--+---------+ VertebralPSV cm/s71EDV cm/s16Antegrade +---------+--------+--+--------+--+---------+ Velocities in the LICA are elevated and stable compared to the prior exam.  Summary: Right Carotid: Velocities in the right ICA are consistent with a 1-39% stenosis.                Non-hemodynamically significant plaque <50% noted in the CCA. Left Carotid: Velocities in the left ICA are consistent with a 40-59% stenosis.               Non-hemodynamically significant plaque <50% noted in the CCA. Vertebrals:  Bilateral vertebral arteries demonstrate antegrade flow. Subclavians: Normal flow hemodynamics were seen in bilateral subclavian              arteries. *See table(s) above for measurements and observations. Suggest follow up study in 12 months. Electronically signed by Larae Grooms MD on 08/21/2020 at 10:35:06 AM.    Final     Impression/Plan: 1. 74 y.o. 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 He will continue to follow up with urology for ongoing PSA determinations and has an appointment scheduled with Dr. Junious Silk on 09/08/2020. He understands what to expect with regards to PSA monitoring going forward.  He anticipates completing the recommended course of LT-ADT, under the direction of Dr. Junious Silk.  I will look forward to following his  response to treatment via correspondence with urology, and would be happy to continue to participate in his care if clinically indicated. I talked to the patient about what to expect in the future, including his risk for erectile dysfunction and rectal bleeding. I encouraged him to call or return to the office if he has any questions regarding his previous radiation or possible radiation side effects. He was comfortable with this plan and will follow up as needed.    Nicholos Johns, PA-C

## 2020-08-27 ENCOUNTER — Encounter: Payer: Self-pay | Admitting: Urology

## 2020-08-27 ENCOUNTER — Ambulatory Visit
Admission: RE | Admit: 2020-08-27 | Discharge: 2020-08-27 | Disposition: A | Discharge status: Home / Self Care | Payer: Medicare PPO | Source: Ambulatory Visit | Attending: Urology | Admitting: Urology

## 2020-08-27 ENCOUNTER — Other Ambulatory Visit: Payer: Self-pay

## 2020-08-27 DIAGNOSIS — C61 Malignant neoplasm of prostate: Secondary | ICD-10-CM

## 2020-08-27 NOTE — Progress Notes (Signed)
Patient states that he empties his bladder with urination . States that he has a moderate stream . States that he has some urgency with urination. Reports nocturia 3-4. Marland KitchenStates that he has an appointment with his urologist on December 6,2021. Patients meaningful use is complete.Patients IPPS is 15

## 2020-09-04 ENCOUNTER — Other Ambulatory Visit: Payer: Self-pay | Admitting: Family Medicine

## 2020-09-04 ENCOUNTER — Ambulatory Visit
Admission: RE | Admit: 2020-09-04 | Discharge: 2020-09-04 | Disposition: A | Discharge status: Home / Self Care | Payer: Medicare PPO | Source: Ambulatory Visit | Attending: Family Medicine | Admitting: Family Medicine

## 2020-09-04 DIAGNOSIS — R5383 Other fatigue: Secondary | ICD-10-CM | POA: Diagnosis not present

## 2020-09-04 DIAGNOSIS — Q676 Pectus excavatum: Secondary | ICD-10-CM | POA: Diagnosis not present

## 2020-09-04 DIAGNOSIS — R059 Cough, unspecified: Secondary | ICD-10-CM

## 2020-09-04 DIAGNOSIS — R0602 Shortness of breath: Secondary | ICD-10-CM | POA: Diagnosis not present

## 2020-09-05 DIAGNOSIS — G4733 Obstructive sleep apnea (adult) (pediatric): Secondary | ICD-10-CM | POA: Diagnosis not present

## 2020-09-08 DIAGNOSIS — D72810 Lymphocytopenia: Secondary | ICD-10-CM | POA: Diagnosis not present

## 2020-09-10 ENCOUNTER — Other Ambulatory Visit (HOSPITAL_COMMUNITY): Payer: Self-pay | Admitting: Student

## 2020-09-10 DIAGNOSIS — C61 Malignant neoplasm of prostate: Secondary | ICD-10-CM | POA: Diagnosis not present

## 2020-09-10 DIAGNOSIS — R3912 Poor urinary stream: Secondary | ICD-10-CM | POA: Diagnosis not present

## 2020-09-10 DIAGNOSIS — N401 Enlarged prostate with lower urinary tract symptoms: Secondary | ICD-10-CM | POA: Diagnosis not present

## 2020-09-10 NOTE — Progress Notes (Signed)
Primary Care Physician: [name], MD Primary Cardiologist: Dr Acie Fredrickson Primary Electrophysiologist: Dr Rayann Heman Referring Physician: Dr Deatra Canter is a 74 y.o. male with a history of CAD, HTN, OSA, CVA, paroxysmal atrial fibrillation, and HLD who presents for follow up in the [facility].  The patient was initially diagnosed with atrial fibrillation in 2016 after presenting with symptoms of presyncope. He converted on IV diltiazem and has been maintained on Xarelto. Patient had done well for years until 07/06/19 when he presented to the ER with symptoms of palpitations and chest pressure and found to be in afib vs atrial flutter with RVR. He converted while being evaluated in the ER and his symptoms resolved. Since then, he has not had any further symptoms. He denies any specific triggers that he could identify. He has a diagnosis of OSA and is compliant with his CPAP. He denies significant alcohol use. He is on Xarelto for a CHADS2VASC score of 5. Patient had another episode of afib on 09/07/19. There were no specific triggers that the patient could identify. He took the PRN diltiazem without much relief. He presented to the ER in afib with RVR and underwent successful DCCV. Patient is s/p dofetilide loading 12/15-12/18/20. Patient had recurrent episodes of blurred vision and "foggy headedness". Neurologic workup was unremarkable. He underwent ILR implantation with Dr Rayann Heman 02/27/20.  On follow up today, patient reports he has done very well since his last visit. He denies any heart racing or palpitations. He denies any bleeding issues on anticoagulation. His ILR shows 0% afib burden.   Today, he denies symptoms of palpitations, chest pain, shortness of breath, orthopnea, PND, lower extremity edema, presyncope, syncope, bleeding, or neurologic sequela. The patient is tolerating medications without difficulties and is otherwise without complaint today.     Atrial Fibrillation Risk Factors:  he does have symptoms or diagnosis of sleep apnea. he is compliant with CPAP therapy. he does not have a history of rheumatic fever. he does not have a history of alcohol use. The patient does not have a history of early familial atrial fibrillation or other arrhythmias.  he has a BMI of Body mass index is 34.75 kg/m.Marland Kitchen Filed Weights   09/11/20 1047  Weight: 119.5 kg    Family History  Problem Relation Age of Onset  . Hypertension Mother   . Heart disease Father   . Heart attack Father   . Prostate cancer Brother   . Breast cancer Neg Hx   . Colon cancer Neg Hx   . Pancreatic cancer Neg Hx      Atrial Fibrillation Management history:  Previous antiarrhythmic drugs: dofetilide Previous cardioversions: 09/07/19 Previous ablations: none CHADS2VASC score: 5 Anticoagulation history: Xarelto    Past Medical History:  Diagnosis Date  . Arthritis    "back, right knee, hands, ankles, neck" (05/31/2016)  . Carotid artery disease ([place]) 08/30/2016   Carotid US 11/19: R 1-39, L 40-59 // Carotid US 08/2019: R 1-39; L 40-59 // Carotid US 11/21: R 1-39; L 40-59>> repeat 1 year   . Chronic lower back pain   . Coronary atherosclerosis of native coronary artery    a. BMS to [facility] 2004 and 2007, otherwise mild nonobstructive disease. EF normal.  . Diverticulitis   . Dyslipidemia   . Essential hypertension, benign   . GERD (gastroesophageal reflux disease)   . Lumbar radiculopathy, chronic 02/04/2015   Right L5  . Obesity   . OSA on CPAP   .  Paroxysmal atrial fibrillation ([place])    a. Discovered after stroke.  . Pneumonia 01/1996  . PONV (postoperative nausea and vomiting)   . Prostate CA ([place])   . Recurrent upper respiratory infection (URI)   . Visit for monitoring Tikosyn therapy 09/18/2019   Past Surgical History:  Procedure Laterality Date  . ANTERIOR CERVICAL DECOMP/DISCECTOMY FUSION  07/2001; 10/2002   "C5-6; C6-7; redo"  . BACK  SURGERY    . CARPAL TUNNEL RELEASE Left 10/2015  . COLONOSCOPY W/ POLYPECTOMY  02/2014  . CORONARY ANGIOPLASTY WITH STENT PLACEMENT  05/2003; 12/2005   "mid RCA; mid RCA"  . HAND SURGERY  02/2019   LEFT HAND  . implantable loop recorder placement  02/27/2020   Med[employer]onic Reveal [place] model [id number]  implantable loop recorder implanted by Dr Rayann Heman for afib management and evaluation of presyncope  . JOINT REPLACEMENT    . KNEE ARTHROSCOPY Left 10/2005  . KNEE ARTHROSCOPY W/ PARTIAL MEDIAL MENISCECTOMY Left 09/2005  . LUMBAR LAMINECTOMY/DECOMPRESSION MICRODISCECTOMY  03/2005   "L4-5"  . POSTERIOR LUMBAR FUSION  10/2003   L5-S1; "plates, screws"  . SHOULDER ARTHROSCOPY Right 08/2011   Debridement of labrum, arthroscopic distal clavicle excision  . SHOULDER OPEN ROTATOR CUFF REPAIR Left 07/2014  . TEE WITHOUT CARDIOVERSION  07/07/2012   Procedure: TRANSESOPHAGEAL ECHOCARDIOGRAM (TEE);  Surgeon: Fay Records, MD;  Location: [facility];  Service: Cardiovascular;  Laterality: N/A;  . TONSILLECTOMY AND ADENOIDECTOMY  ~ 1956  . TOTAL KNEE ARTHROPLASTY Left 10/2006  . TRIGGER FINGER RELEASE Left 10/2015    Current Outpatient Medications  Medication Sig Dispense Refill  . acetaminophen (TYLENOL 8 HOUR ARTHRITIS PAIN) 650 MG CR tablet Take 1,300 mg by mouth as needed for pain (pain).     Marland Kitchen ALPRAZolam (XANAX) 0.5 MG tablet Take 1 tablet (0.5 mg total) by mouth as needed for anxiety (take one tablet 30 minutes prior to MRI and may repeat once, just prior to scan if needed). 2 tablet 0  . atorvastatin (LIPITOR) 40 MG tablet TAKE 1 TABLET BY MOUTH EVERY DAY 90 tablet 2  . clindamycin (CLEOCIN) 300 MG capsule clindamycin HCl 300 mg capsule  TAKE 2 CAPSULES BY MOUTH 1 HOUR PRIOR TO DENTAL TREATMENT    . diltiazem (CARDIZEM) 30 MG tablet Take 1 tablet (30 mg total) by mouth 4 (four) times daily as needed. 60 tablet 6  . docusate sodium (COLACE) 100 MG capsule Take 100 mg by mouth 2 (two) times daily.     Marland Kitchen dofetilide (TIKOSYN) 500 MCG capsule TAKE 1 CAPSULE BY MOUTH TWICE A DAY 180 capsule 1  . fenofibrate 160 MG tablet TAKE 1 TABLET BY MOUTH EVERY DAY 90 tablet 3  . fluticasone (FLONASE) 50 MCG/ACT nasal spray Place 2 sprays into the nose daily.     Marland Kitchen ipratropium (ATROVENT) 0.03 % nasal spray Place 1 spray into both nostrils in the morning and at bedtime.    Marland Kitchen leuprolide, 6 Month, (ELIGARD) 45 MG injection Inject 45 mg into the skin every 6 (six) months.    . levocetirizine (XYZAL) 5 MG tablet Take 5 mg by mouth every evening.    . loperamide (IMODIUM) 2 MG capsule Take 2 mg by mouth as needed for diarrhea or loose stools.    . magnesium oxide (MAG-OX) 400 MG tablet TAKE 1 TABLET BY MOUTH EVERY DAY 90 tablet 2  . metoprolol succinate (TOPROL XL) 25 MG 24 hr tablet Take 1 tablet (25 mg total) by mouth daily. [place]  tablet 3  . Multiple Vitamins-Minerals (MULTIVITAMINS THER. W/MINERALS) TABS Take 1 tablet by mouth daily.    . nitroGLYCERIN (NITROSTAT) 0.4 MG SL tablet Place 1 tablet (0.4 mg total) under the tongue every 5 (five) minutes as needed for chest pain. 25 tablet 5  . omeprazole (PRILOSEC) 10 MG capsule Take 1 capsule (10 mg total) by mouth daily. 90 capsule 3  . potassium chloride (KLOR-CON) 10 MEQ tablet Take 1 tablet (10 mEq total) by mouth daily. Please make yearly appt with Dr. Acie Fredrickson for December for future refills. 1st attempt 90 tablet 1  . tamsulosin (FLOMAX) 0.4 MG CAPS capsule Take 0.4 mg by mouth daily.    Marland Kitchen topiramate (TOPAMAX) 50 MG tablet Take 2 tablets (100 mg total) by mouth 2 (two) times daily. (Patient taking differently: Take 100 mg by mouth. Take 2 tablets by mouth in the AM and 1 tablet by mouth in the evening) 360 tablet 3  . XARELTO 20 MG TABS tablet TAKE 1 TABLET BY MOUTH EVERY DAY 90 tablet 3  . lisinopril (ZESTRIL) 10 MG tablet Take 1 tablet (10 mg total) by mouth daily. (Patient not taking: Reported on 09/11/2020) 90 tablet 3   No current facility-administered  medications for this encounter.    Allergies  Allergen Reactions  . Amoxicillin Swelling  . Fosinopril Other (See Comments)  . Hydromorphone Other (See Comments) and Nausea Only  . Ampicillin Rash  . Codeine Nausea Only and Other (See Comments)    Heavy amounts cause nausea  . Monopril [Fosinopril Sodium] Other (See Comments)    Muscle aches & pain  . Percocet [Oxycodone-Acetaminophen] Nausea And Vomiting    Social History   Socioeconomic History  . Marital status: Married    Spouse name: [name]  . Number of children: 3  . Years of education: 41  . Highest education level: Not on file  Occupational History  . Occupation: Retired    Fish farm manager: DISABLED     Comment: Works at [employer] part time  Tobacco Use  . Smoking status: Former Smoker    Packs/day: 1.00    Years: 34.00    Pack years: 34.00    Types: Cigarettes    Quit date: 05/05/2003    Years since quitting: 17.3  . Smokeless tobacco: Never Used  Vaping Use  . Vaping Use: Never used  Substance and Sexual Activity  . Alcohol use: No    Alcohol/week: 0.0 standard drinks  . Drug use: No  . Sexual activity: Not Currently  Other Topics Concern  . Not on file  Social History Narrative   Patient lives at home with spouse.   Caffeine Use:    Social Determinants of Health   Financial Resource Strain: Not on file  Food Insecurity: Not on file  Transportation Needs: Not on file  Physical Activity: Not on file  Stress: Not on file  Social Connections: Not on file  Intimate Partner Violence: Not on file     ROS- All systems are reviewed and negative except as per the HPI above.  Physical Exam: Vitals:   09/11/20 1047  BP: 106/64  Pulse: 70  Weight: 119.5 kg  Height: 6\' 1"  (1.854 m)    GEN- The patient is well appearing obese male, alert and oriented x 3 today.   HEENT-head normocephalic, atraumatic, sclera clear, conjunctiva pink, hearing intact, trachea midline. Lungs- Clear to ausculation bilaterally,  normal work of breathing Heart- Regular rate and rhythm, no murmurs, rubs or gallops  GI- soft,  NT, ND, + BS Extremities- no clubbing, cyanosis, or edema MS- no significant deformity or atrophy Skin- no rash or lesion Psych- euthymic mood, full affect Neuro- strength and sensation are intact   Wt Readings from Last 3 Encounters:  09/11/20 119.5 kg  08/11/20 121.1 kg  07/07/20 118.8 kg    EKG today demonstrates SR HR 70, PR 180, QRS 98, QTc 475  Echo 07/19/19 demonstrated   1. Left ventricular ejection fraction, by visual estimation, is 60 to 65%. The left ventricle has normal function. Normal left ventricular size. There is mildly increased left ventricular hypertrophy.  2. Definity contrast agent was given IV to delineate the left ventricular endocardial borders.  3. Global right ventricle has normal systolic function.The right ventricular size is normal. No increase in right ventricular wall thickness.  4. Left atrial size was mildly dilated.  5. Right atrial size was normal.  6. The mitral valve is normal in structure. Trace mitral valve regurgitation. No evidence of mitral stenosis.  7. The tricuspid valve is normal in structure. Tricuspid valve regurgitation was not visualized by color flow Doppler.  8. The aortic valve is tricuspid Aortic valve regurgitation was not visualized by color flow Doppler. Mild aortic valve sclerosis without stenosis.  9. The pulmonic valve was normal in structure. Pulmonic valve regurgitation is not visualized by color flow Doppler. 10. The inferior vena cava is normal in size with greater than 50% respiratory variability, suggesting right atrial pressure of 3 mmHg.  Epic records are reviewed at length today  Assessment and Plan:  1. Paroxysmal atrial fibrillation S/p dofetilide loading 12/15-12/18/20. 0% afib burden on ILR. Continue dofetilide 500 mcg BID. QT stable. Check bmet/mag Continue Xarelto 20 mg daily.  Continue Toprol 50 mg  daily Continue diltiazem 30 mg PRN q4hrs for heart racing.  This patients CHA2DS2-VASc Score and unadjusted Ischemic Stroke Rate (% per year) is equal to 7.2 % stroke rate/year from a score of 5  Above score calculated as 1 point each if present [CHF, HTN, DM, Vascular=MI/PAD/Aortic Plaque, Age if 65-74, or Male] Above score calculated as 2 points each if present [Age > 75, or Stroke/TIA/TE]  2. Obesity Body mass index is 34.75 kg/m. Lifestyle modification was discussed and encouraged including regular physical activity and weight reduction.  3. Obstructive sleep apnea Patient reports compliance with CPAP therapy.  4. CAD Normal stress myoveiw 10/17/19 No anginal symptoms.  5. HTN Stable, no changes today.   Follow up with Chanetta Marshall as scheduled. AF clinic in 6 months.    [facility] [address] [place], [place] [address] [phone] 09/11/2020 11:08 AM

## 2020-09-11 ENCOUNTER — Encounter (HOSPITAL_COMMUNITY): Payer: Self-pay | Admitting: Physician Assistant

## 2020-09-11 ENCOUNTER — Other Ambulatory Visit: Payer: Self-pay

## 2020-09-11 ENCOUNTER — Ambulatory Visit (HOSPITAL_COMMUNITY)
Admission: RE | Admit: 2020-09-11 | Discharge: 2020-09-11 | Disposition: A | Discharge status: Home / Self Care | Payer: Medicare PPO | Source: Ambulatory Visit | Attending: Physician Assistant | Admitting: Physician Assistant

## 2020-09-11 VITALS — BP 106/64 | HR 70 | Ht 73.0 in | Wt 263.4 lb

## 2020-09-11 DIAGNOSIS — Z01818 Encounter for other preprocedural examination: Secondary | ICD-10-CM | POA: Diagnosis not present

## 2020-09-11 DIAGNOSIS — D6869 Other thrombophilia: Secondary | ICD-10-CM | POA: Diagnosis not present

## 2020-09-11 DIAGNOSIS — I480 Paroxysmal atrial fibrillation: Secondary | ICD-10-CM | POA: Diagnosis not present

## 2020-09-11 LAB — BASIC METABOLIC PANEL
Anion gap: 10 (ref 5–15)
BUN: 14 mg/dL (ref 8–23)
CO2: 19 mmol/L — ABNORMAL LOW (ref 22–32)
Calcium: 9.1 mg/dL (ref 8.9–10.3)
Chloride: 110 mmol/L (ref 98–111)
Creatinine, Ser: 0.97 mg/dL (ref 0.61–1.24)
GFR, Estimated: >60 mL/min (ref >60)
Glucose, Bld: 128 mg/dL — ABNORMAL HIGH (ref 70–99)
Potassium: 4.2 mmol/L (ref 3.5–5.1)
Sodium: 139 mmol/L (ref 135–145)

## 2020-09-11 LAB — MAGNESIUM: Magnesium: 1.9 mg/dL (ref 1.7–2.4)

## 2020-09-12 ENCOUNTER — Encounter: Payer: Self-pay | Admitting: Cardiovascular Disease

## 2020-09-12 NOTE — Telephone Encounter (Signed)
   [facility] Pre-operative Risk Assessment    HEARTCARE STAFF: - Please ensure there is not already an duplicate clearance open for this procedure. - Under Visit Info/Reason for Call, type in Other and utilize the format Clearance MM/DD/YY or Clearance TBD. Do not use dashes or single digits. - If request is for dental extraction, please clarify the # of teeth to be extracted.  Request for surgical clearance:  1. What type of surgery is being performed? A1 pulley release on left middle and ring finger  2. When is this surgery scheduled? 09/18/20  3. What type of clearance is required (medical clearance vs. Pharmacy clearance to hold med vs. Both)? medical  4. Are there any medications that need to be held prior to surgery and how long? n/a  5. Practice name and name of physician performing surgery? [facility] - Dr. Daryll Brod  6. What is the office phone number? [phone]   7.   What is the office fax number? [phone]  8.   Anesthesia type (None, local, MAC, general) ? [facility]   Leah Newnam 09/12/2020, 3:16 PM  _________________________________________________________________   (provider comments below)

## 2020-09-12 NOTE — Telephone Encounter (Signed)
Not mentioned on clearance form but patient is on Xarelto. Pharmacy, can you please comment on how long this needs to be held for upcoming procedure?  Thank you!

## 2020-09-12 NOTE — Telephone Encounter (Signed)
Patient with diagnosis of atrial fibrillation on Xarelto for anticoagulation.    Procedure: A1 pulley release on left middle and ring fingers Date of procedure: 09/18/20    CHA2DS2-VASc Score = 5  This indicates a 7.2% annual risk of stroke. The patient's score is based upon: CHF History: No HTN History: Yes Diabetes History: No Stroke History: Yes Vascular Disease History: Yes Age Score: 1 Gender Score: 0   CrCl 76.7 with IBW Platelet count 275  Per office protocol, patient can hold Xarelto for 1 days prior to procedure.    If not bridging, patient should restart Xarelto on the evening of procedure at discretion of procedure MD  NOTE:  Clearance did not ask for pharmacy hold for Xarelto.  If MD does not need to hold Xarelto, please do not.

## 2020-09-13 LAB — CUP PACEART REMOTE DEVICE CHECK
Date Time Interrogation Session: 20211210085249
Implantable Pulse Generator Implant Date: 20210526

## 2020-09-15 ENCOUNTER — Ambulatory Visit (INDEPENDENT_AMBULATORY_CARE_PROVIDER_SITE_OTHER): Payer: Medicare PPO

## 2020-09-15 DIAGNOSIS — H9313 Tinnitus, bilateral: Secondary | ICD-10-CM | POA: Diagnosis not present

## 2020-09-15 DIAGNOSIS — J343 Hypertrophy of nasal turbinates: Secondary | ICD-10-CM | POA: Diagnosis not present

## 2020-09-15 DIAGNOSIS — R55 Syncope and collapse: Secondary | ICD-10-CM | POA: Diagnosis not present

## 2020-09-15 DIAGNOSIS — R42 Dizziness and giddiness: Secondary | ICD-10-CM | POA: Diagnosis not present

## 2020-09-15 DIAGNOSIS — J342 Deviated nasal septum: Secondary | ICD-10-CM | POA: Diagnosis not present

## 2020-09-15 DIAGNOSIS — H903 Sensorineural hearing loss, bilateral: Secondary | ICD-10-CM | POA: Diagnosis not present

## 2020-09-15 NOTE — Telephone Encounter (Signed)
This encounter was created in error - please disregard.

## 2020-09-15 NOTE — Telephone Encounter (Signed)
This encounter for preop clearance was placed in the wrong patient's chart. This should have gone to his twin brother's chart.

## 2020-09-17 NOTE — Progress Notes (Signed)
New Patient Note  RE: Corey [name] MRN: [id number] DOB: [birth year] Date of Office Visit: 09/18/2020  Referring provider: [name], MD Primary care provider: [name], MD  Chief Complaint: Allergic Rhinitis  and Cough  History of Present Illness: I had the pleasure of seeing Corey [name] for initial evaluation at the [facility] on 09/18/2020. He is a 74 y.o. male, who is referred here by Dorthy Cooler, [place], MD for the evaluation of allergic rhinitis and coughing.   Rhinitis:  He reports symptoms of coughing, rhinorrhea, PND, sneezing, itchy/watery eyes, sneezing, nasal congestion. Symptoms have been going on for 20+ years. The symptoms are present all year around with worsening in spring and fall. Other triggers include exposure to heat, cold, change in temperature. Anosmia: no. Headache: sometimes. He has used fluticasone 2 sprays per nostril daily, ipratropium 2 sprays per nostril BID, Xyzal with some improvement in symptoms. Sinus infections: one 2 weeks ago - finished prednisone. Previous work up includes: none. Previous ENT evaluation: yes this week - nasal passages are too dry, deviated septum.  Patient sleeps with a CPAP machine.  Previous sinus imaging: no. History of nasal polyps: no. Last eye exam: last May. History of reflux: taking omeprazole 10mg  daily with good benefit.  Coughing:  He reports symptoms of chest tightness, shortness of breath, coughing, wheezing for 20+ years. Current medications include none. He tried the following inhalers: none. Main triggers are drainage. In the last month, frequency of symptoms: daily. Frequency of nocturnal symptoms: 0x/month. Interference with physical activity: yes. Sleep is undisturbed. In the last 12 months, emergency room visits/urgent care visits/doctor office visits or hospitalizations due to respiratory issues: depending on symptoms. In the last 12 months, oral steroids courses: 1-2 per year. Lifetime  history of hospitalization for respiratory issues: 0. Prior intubations: 0. History of pneumonia: yes. He was not evaluated by allergist/pulmonologist in the past. Smoking exposure: quit in 2004. Up to date with flu vaccine: yes. Up to date with pneumonia vaccine: yes. Up to date with COVID-19 vaccine: yes.   Underwent prostate radiation - follows with urology, heme/onc.  Currently off lisinopril for 1 month with no improvement in coughing.  Patient follows with cardiology for paf, CAD, HTN.   09/04/2020 CXR: "IMPRESSION: No acute cardiopulmonary disease.  Borderline cardiomegaly, without congestive failure.  Pectus excavatum deformity."  Assessment and Plan: Corey [name] is a 74 y.o. male with: Allergic rhinitis with non-allergic component Perennial rhinoconjunctivitis symptoms for 20+ years with worsening in the spring and fall.  Other triggers include change in temperature.  Tried Flonase, ipratropium and Xyzal with some benefit.  Recent ENT evaluation showed deviated septum and dry nasal passages.  Patient has obstructive sleep apnea and wears a CPAP machine at night. No prior allergy work up.  Today's skin testing showed: Positive to tree pollen only.  Discussed with patient that the above tree pollen allergy does not explain his perennial symptoms. Most likely has a component of non-allergic rhinitis.   Start environmental control measures as below.   Start dymista (fluticasone + azelastine nasal spray combination) 1 spray per nostril twice a day.  This replaces Flonase (fluticasone) for now. If it's not covered let us know.   Nasal saline spray (i.e., Simply Saline) or nasal saline lavage (i.e., NeilMed) is recommended as needed and prior to medicated nasal sprays.  Follow up with ENT as scheduled.  Stop ipratropium for now as it may be drying out the nasal passages too much.  Only use levocetirizine (Xyzal) 5mg  once a day if needed - patient did not notice worsening symptoms since  off antihistamines the past 3 days.   Coughing Episodes of coughing from postnasal drip, wheezing, shortness of breath and chest tightness for 20+ years.  No prior inhaler use.  Recent chest x-ray was unremarkable.  Patient also has significant cardiac history with paroxysmal atrial fibrillation, coronary artery disease and hypertension.  No longer on lisinopril. . Today's spirometry was normal with no improvement in FEV1 post bronchodilator treatment.  Clinically feeling improved. . The most common causes of chronic cough include the following: upper airway cough syndrome (UACS) which is caused by variety of rhinitis conditions; asthma; gastroesophageal reflux disease (GERD); chronic bronchitis from cigarette smoking or other inhaled environmental irritants; non-asthmatic eosinophilic bronchitis; and bronchiectasis.  . In prospective studies, these conditions have accounted for up to 94% of the causes of chronic cough in immunocompetent adults.  . Given his clinical symptoms, the coughing may be multifactorial in nature. interestingly though it seems to improve after prednisone. . May use albuterol rescue inhaler 2 puffs every 4 to 6 hours as needed for shortness of breath, chest tightness, coughing, and wheezing. Monitor frequency of use.  o Spacer given and demonstrated proper use with inhaler. Patient understood technique and all questions/concerned were addressed.  o If noticing significant improvement and frequent use then will do a trial of maintenance inhaler next.   . Start dymista as above.   Continue with omeprazole 10mg  daily as before - see below for heartburn lifestyle.   Return in about 2 months (around 11/19/2020).  Meds ordered this encounter  Medications  . albuterol (VENTOLIN HFA) 108 (90 Base) MCG/ACT inhaler    Sig: Inhale 2 puffs into the lungs every 4 (four) hours as needed for wheezing or shortness of breath (coughing fits).    Dispense:  8 g    Refill:  2  .  Azelastine-Fluticasone 137-50 MCG/ACT SUSP    Sig: Place 1 spray into the nose in the morning and at bedtime.    Dispense:  23 g    Refill:  5   Other allergy screening: Food allergy: no Medication allergy: yes  Amoxicillin - rash Hymenoptera allergy: no Urticaria: no Eczema:no History of recurrent infections suggestive of immunodeficency: no  Diagnostics: Spirometry:  Tracings reviewed. His effort: Good reproducible efforts. FVC: 4.34L FEV1: 3.01L, 82% predicted FEV1/FVC ratio: 69% Interpretation: Spirometry consistent with normal pattern with no improvement in FEV1 post bronchodilator treatment. Clinically feeling improved.  Please see scanned spirometry results for details.  Skin Testing: Environmental allergy panel. Positive to tree pollen. Results discussed with patient/family.  Airborne Adult Perc - 09/18/20 1102    Time Antigen Placed 1102    Allergen Manufacturer Greer    Location Back    Number of Test 59    Panel 1 Select    1. Control-Buffer 50% Glycerol Negative    2. Control-Histamine 1 mg/ml 2+    3. Albumin saline Negative    4. Oxford Negative    5. Guatemala Negative    6. Johnson Negative    7. Bolivar Blue Negative    8. Meadow Fescue Negative    9. Perennial Rye Negative    10. Sweet Vernal Negative    11. Timothy Negative    12. Cocklebur Negative    13. Burweed Marshelder Negative    14. Ragweed, short Negative    15. Ragweed, Giant Negative    16. Plantain,  English  Negative    17. Lamb's Quarters Negative    18. Sheep Sorrell Negative    19. Rough Pigweed Negative    20. Marsh Elder, Rough Negative    21. Mugwort, Common Negative    22. Ash mix Negative    23. Birch mix 3+    24. Beech American Negative    25. Box, Elder Negative    26. Cedar, red Negative    27. Cottonwood, [place] Negative    28. Elm mix Negative    29. Hickory Negative    30. Maple mix Negative    31. Oak, [place] mix Negative    32. Pecan Pollen Negative    33.  Pine mix Negative    34. Sycamore Eastern Negative    35. Avoca, Black Pollen Negative    36. Alternaria alternata Negative    37. Cladosporium Herbarum Negative    38. Aspergillus mix Negative    39. Penicillium mix Negative    40. Bipolaris sorokiniana (Helminthosporium) Negative    41. Drechslera spicifera (Curvularia) Negative    42. Mucor plumbeus Negative    43. Fusarium moniliforme Negative    44. Aureobasidium pullulans (pullulara) Negative    45. Rhizopus oryzae Negative    46. Botrytis cinera Negative    47. Epicoccum nigrum Negative    48. Phoma betae Negative    49. Candida Albicans Negative    50. Trichophyton mentagrophytes Negative    51. Mite, D Farinae  5,000 AU/ml Negative    52. Mite, D Pteronyssinus  5,000 AU/ml Negative    53. Cat Hair 10,000 BAU/ml Negative    54.  Dog Epithelia Negative    55. Mixed Feathers Negative    56. Horse Epithelia Negative    57. Cockroach, German Negative    58. Mouse Negative    59. Tobacco Leaf Negative          Intradermal - 09/18/20 1131    Time Antigen Placed 1131    Allergen Manufacturer Lavella Hammock    Location Arm    Number of Test 14    Intradermal Select    Control Negative    Guatemala Negative    Johnson Negative    7 Grass Negative    Ragweed mix Negative    Weed mix Negative    Mold 1 Negative    Mold 2 Negative    Mold 3 Negative    Mold 4 Negative    Cat Negative    Dog Negative    Cockroach Negative    Mite mix Negative           Past Medical History: Patient Active Problem List   Diagnosis Date Noted  . Allergic rhinitis with non-allergic component 09/18/2020  . Heartburn 09/18/2020  . Coughing 09/18/2020  . Malignant neoplasm of prostate ([place]) 03/04/2020  . Pre-syncope 02/27/2020  . Cerebral embolism with transient ischemic attack (TIA) 02/11/2020  . History of stroke 02/11/2020  . Embolic stroke involving cerebral artery ([place]) 02/11/2020  . Secondary hypercoagulable state ([place]) 09/10/2019   . Essential tremor 06/25/2019  . Chest pain 05/27/2017  . Demand ischemia ([place]) 05/27/2017  . Morbid obesity ([place]) 05/27/2017  . Carotid artery disease ([place]) 08/30/2016  . Essential hypertension 08/30/2016  . Diverticulitis of intestine without perforation or abscess without bleeding   . Hyponatremia 06/04/2016  . Diverticulitis 05/31/2016  . Iron deficiency 01/05/2016  . Lumbar radiculopathy, chronic 02/04/2015  . SVT (supraventricular tachycardia) ([place]) 12/03/2014  . PAC (premature atrial contraction)  04/05/2014  . OSA on CPAP 03/27/2014  . AKI (acute kidney injury) ([place]) 12/05/2013  . Nausea 12/05/2013  . Sepsis ([place]) 12/05/2013  . Abdominal pain 12/05/2013  . Atrial fibrillation ([place]) 01/05/2013  . CVA (cerebral infarction) 07/06/2012  . Coronary artery disease   . Dyslipidemia   . GERD (gastroesophageal reflux disease)    Past Medical History:  Diagnosis Date  . Arthritis    "back, right knee, hands, ankles, neck" (05/31/2016)  . Carotid artery disease ([place]) 08/30/2016   Carotid US 11/19: R 1-39, L 40-59 // Carotid US 08/2019: R 1-39; L 40-59 // Carotid US 11/21: R 1-39; L 40-59>> repeat 1 year   . Chronic lower back pain   . Coronary atherosclerosis of native coronary artery    a. BMS to [facility] 2004 and 2007, otherwise mild nonobstructive disease. EF normal.  . Diverticulitis   . Dyslipidemia   . Essential hypertension, benign   . GERD (gastroesophageal reflux disease)   . Lumbar radiculopathy, chronic 02/04/2015   Right L5  . Obesity   . OSA on CPAP   . Paroxysmal atrial fibrillation ([place])    a. Discovered after stroke.  . Pneumonia 01/1996  . PONV (postoperative nausea and vomiting)   . Prostate CA ([place])   . Recurrent upper respiratory infection (URI)   . Visit for monitoring Tikosyn therapy 09/18/2019   Past Surgical History: Past Surgical History:  Procedure Laterality Date  . ADENOIDECTOMY    . ANTERIOR CERVICAL DECOMP/DISCECTOMY FUSION  07/2001; 10/2002    "C5-6; C6-7; redo"  . BACK SURGERY    . CARPAL TUNNEL RELEASE Left 10/2015  . COLONOSCOPY W/ POLYPECTOMY  02/2014  . CORONARY ANGIOPLASTY WITH STENT PLACEMENT  05/2003; 12/2005   "mid RCA; mid RCA"  . HAND SURGERY  02/2019   LEFT HAND  . implantable loop recorder placement  02/27/2020   Medtronic Reveal Campbell model [id number]  implantable loop recorder implanted by Dr Rayann Heman for afib management and evaluation of presyncope  . JOINT REPLACEMENT    . KNEE ARTHROSCOPY Left 10/2005  . KNEE ARTHROSCOPY W/ PARTIAL MEDIAL MENISCECTOMY Left 09/2005  . LUMBAR LAMINECTOMY/DECOMPRESSION MICRODISCECTOMY  03/2005   "L4-5"  . POSTERIOR LUMBAR FUSION  10/2003   L5-S1; "plates, screws"  . SHOULDER ARTHROSCOPY Right 08/2011   Debridement of labrum, arthroscopic distal clavicle excision  . SHOULDER OPEN ROTATOR CUFF REPAIR Left 07/2014  . TEE WITHOUT CARDIOVERSION  07/07/2012   Procedure: TRANSESOPHAGEAL ECHOCARDIOGRAM (TEE);  Surgeon: Fay Records, MD;  Location: [facility];  Service: Cardiovascular;  Laterality: N/A;  . TONSILLECTOMY AND ADENOIDECTOMY  ~ 1956  . TOTAL KNEE ARTHROPLASTY Left 10/2006  . TRIGGER FINGER RELEASE Left 10/2015   Medication List:  Current Outpatient Medications  Medication Sig Dispense Refill  . acetaminophen (TYLENOL 8 HOUR ARTHRITIS PAIN) 650 MG CR tablet Take 1,300 mg by mouth as needed for pain (pain).     Marland Kitchen ALPRAZolam (XANAX) 0.5 MG tablet Take 1 tablet (0.5 mg total) by mouth as needed for anxiety (take one tablet 30 minutes prior to MRI and may repeat once, just prior to scan if needed). 2 tablet 0  . atorvastatin (LIPITOR) 40 MG tablet TAKE 1 TABLET BY MOUTH EVERY DAY 90 tablet 2  . clindamycin (CLEOCIN) 300 MG capsule clindamycin HCl 300 mg capsule  TAKE 2 CAPSULES BY MOUTH 1 HOUR PRIOR TO DENTAL TREATMENT    . diltiazem (CARDIZEM) 30 MG tablet Take 1 tablet (30 mg total) by mouth 4 (  four) times daily as needed. 60 tablet 6  . docusate sodium (COLACE) 100 MG  capsule Take 100 mg by mouth 2 (two) times daily.    Marland Kitchen dofetilide (TIKOSYN) 500 MCG capsule TAKE 1 CAPSULE BY MOUTH TWICE A DAY 180 capsule 1  . fenofibrate 160 MG tablet TAKE 1 TABLET BY MOUTH EVERY DAY 90 tablet 3  . leuprolide, 6 Month, (ELIGARD) 45 MG injection Inject 45 mg into the skin every 6 (six) months.    . levocetirizine (XYZAL) 5 MG tablet Take 5 mg by mouth every evening.    Marland Kitchen lisinopril (ZESTRIL) 10 MG tablet Take 1 tablet (10 mg total) by mouth daily. 90 tablet 3  . magnesium oxide (MAG-OX) 400 MG tablet TAKE 1 TABLET BY MOUTH EVERY DAY 90 tablet 2  . metoprolol succinate (TOPROL XL) 25 MG 24 hr tablet Take 1 tablet (25 mg total) by mouth daily. 90 tablet 3  . Multiple Vitamins-Minerals (MULTIVITAMINS THER. W/MINERALS) TABS Take 1 tablet by mouth daily.    . nitroGLYCERIN (NITROSTAT) 0.4 MG SL tablet Place 1 tablet (0.4 mg total) under the tongue every 5 (five) minutes as needed for chest pain. 25 tablet 5  . omeprazole (PRILOSEC) 10 MG capsule Take 1 capsule (10 mg total) by mouth daily. 90 capsule 3  . potassium chloride (KLOR-CON) 10 MEQ tablet Take 1 tablet (10 mEq total) by mouth daily. Please make yearly appt with Dr. Acie Fredrickson for December for future refills. 1st attempt 90 tablet 1  . Saccharomyces boulardii (PROBIOTIC) 250 MG CAPS 1 capsule    . tamsulosin (FLOMAX) 0.4 MG CAPS capsule Take 0.4 mg by mouth daily.    Marland Kitchen topiramate (TOPAMAX) 50 MG tablet Take 2 tablets (100 mg total) by mouth 2 (two) times daily. (Patient taking differently: Take 100 mg by mouth. Take 2 tablets by mouth in the AM and 1 tablet by mouth in the evening) 360 tablet 3  . XARELTO 20 MG TABS tablet TAKE 1 TABLET BY MOUTH EVERY DAY 90 tablet 3  . albuterol (VENTOLIN HFA) 108 (90 Base) MCG/ACT inhaler Inhale 2 puffs into the lungs every 4 (four) hours as needed for wheezing or shortness of breath (coughing fits). 8 g 2  . Azelastine-Fluticasone 137-50 MCG/ACT SUSP Place 1 spray into the nose in the morning  and at bedtime. 23 g 5  . loperamide (IMODIUM) 2 MG capsule Take 2 mg by mouth as needed for diarrhea or loose stools. (Patient not taking: Reported on 09/18/2020)     No current facility-administered medications for this visit.   Allergies: Allergies  Allergen Reactions  . Amoxicillin Swelling  . Fosinopril Other (See Comments)  . Hydromorphone Other (See Comments) and Nausea Only  . Ampicillin Rash  . Codeine Nausea Only and Other (See Comments)    Heavy amounts cause nausea  . Monopril [Fosinopril Sodium] Other (See Comments)    Muscle aches & pain  . Percocet [Oxycodone-Acetaminophen] Nausea And Vomiting   Social History: Social History   Socioeconomic History  . Marital status: Married    Spouse name: [name]  . Number of children: 3  . Years of education: 46  . Highest education level: Not on file  Occupational History  . Occupation: Retired    Fish farm manager: DISABLED     Comment: Works at [employer] part time  Tobacco Use  . Smoking status: Former Smoker    Packs/day: 1.00    Years: 34.00    Pack years: 34.00    Types: Cigarettes  Quit date: 05/05/2003    Years since quitting: 17.3  . Smokeless tobacco: Never Used  Vaping Use  . Vaping Use: Never used  Substance and Sexual Activity  . Alcohol use: No    Alcohol/week: 0.0 standard drinks  . Drug use: No  . Sexual activity: Not Currently  Other Topics Concern  . Not on file  Social History Narrative   Patient lives at home with spouse.   Caffeine Use:    Social Determinants of Health   Financial Resource Strain: Not on file  Food Insecurity: Not on file  Transportation Needs: Not on file  Physical Activity: Not on file  Stress: Not on file  Social Connections: Not on file   Lives in a house. Smoking: quit in 2004 Occupation: Therapist, sports  Environmental History: Water Damage/mildew in the house: no Carpet in the family room: yes Carpet in the bedroom: yes Heating: electric Cooling: heat  pump Pet: yes occasional dog  Family History: Family History  Problem Relation Age of Onset  . Hypertension Mother   . Heart disease Father   . Heart attack Father   . Prostate cancer Brother   . Breast cancer Neg Hx   . Colon cancer Neg Hx   . Pancreatic cancer Neg Hx    Problem                               Relation Asthma                                   No  Eczema                                No  Food allergy                          No  Allergic rhino conjunctivitis     Twin brother.  Review of Systems  Constitutional: Negative for appetite change, chills, fever and unexpected weight change.  HENT: Positive for congestion, postnasal drip, rhinorrhea and sneezing.   Eyes: Positive for itching.  Respiratory: Positive for cough, chest tightness, shortness of breath and wheezing.   Cardiovascular: Negative for chest pain.  Gastrointestinal: Negative for abdominal pain.  Genitourinary: Negative for difficulty urinating.  Skin: Negative for rash.  Allergic/Immunologic: Positive for environmental allergies.  Neurological: Negative for headaches.   Objective: BP 130/76   Pulse 74   Temp (!) 96.2 F (35.7 C) (Temporal)   Resp 17   Ht 6' 1.5" (1.867 m)   Wt 267 lb 12 oz (121.5 kg)   SpO2 98%   BMI 34.85 kg/m  Body mass index is 34.85 kg/m. Physical Exam Vitals and nursing note reviewed.  Constitutional:      Appearance: Normal appearance. He is well-developed.  HENT:     Head: Normocephalic and atraumatic.     Right Ear: External ear normal.     Left Ear: External ear normal.     Nose: Nose normal.     Mouth/Throat:     Mouth: Mucous membranes are moist.     Pharynx: Oropharynx is clear.  Eyes:     Conjunctiva/sclera: Conjunctivae normal.  Cardiovascular:     Rate and Rhythm: Normal rate and regular rhythm.  Heart sounds: Normal heart sounds. No murmur heard. No friction rub. No gallop.   Pulmonary:     Effort: Pulmonary effort is normal.     Breath  sounds: Normal breath sounds. No wheezing, rhonchi or rales.  Abdominal:     Palpations: Abdomen is soft.  Musculoskeletal:     Cervical back: Neck supple.  Skin:    General: Skin is warm.     Findings: No rash.  Neurological:     Mental Status: He is alert and oriented to person, place, and time.  Psychiatric:        Behavior: Behavior normal.    The plan was reviewed with the patient/family, and all questions/concerned were addressed.  It was my pleasure to see Corey [name] today and participate in his care. Please feel free to contact me with any questions or concerns.  Sincerely,  Rexene Alberts, DO Allergy & Immunology  [facility] office: [place] office: [phone]

## 2020-09-18 ENCOUNTER — Other Ambulatory Visit: Payer: Self-pay | Admitting: Cardiovascular Disease

## 2020-09-18 ENCOUNTER — Other Ambulatory Visit: Payer: Self-pay | Admitting: Allergy

## 2020-09-18 ENCOUNTER — Ambulatory Visit: Payer: Medicare PPO | Admitting: Allergy

## 2020-09-18 ENCOUNTER — Encounter: Payer: Self-pay | Admitting: Allergy

## 2020-09-18 ENCOUNTER — Other Ambulatory Visit: Payer: Self-pay

## 2020-09-18 VITALS — BP 130/76 | HR 74 | Temp 96.2°F | Resp 17 | Ht 73.5 in | Wt 267.8 lb

## 2020-09-18 DIAGNOSIS — J3089 Other allergic rhinitis: Secondary | ICD-10-CM | POA: Diagnosis not present

## 2020-09-18 DIAGNOSIS — R059 Cough, unspecified: Secondary | ICD-10-CM | POA: Insufficient documentation

## 2020-09-18 DIAGNOSIS — R12 Heartburn: Secondary | ICD-10-CM | POA: Diagnosis not present

## 2020-09-18 MED ORDER — FLUTICASONE PROPIONATE 50 MCG/ACT NA SUSP: 2.0000 | Freq: Every day | NASAL | 5 refills | Status: DC

## 2020-09-18 MED ORDER — AZELASTINE-FLUTICASONE 137-50 MCG/ACT NA SUSP: 1.0000 | Freq: Two times a day (BID) | NASAL | 5 refills | Status: DC

## 2020-09-18 MED ORDER — ALBUTEROL SULFATE HFA 108 (90 BASE) MCG/ACT IN AERS
2.0000 | INHALATION_SPRAY | RESPIRATORY_TRACT | 2 refills | Status: DC | PRN
Start: 2020-09-18 — End: 2021-01-08

## 2020-09-18 NOTE — Assessment & Plan Note (Addendum)
Episodes of coughing from postnasal drip, wheezing, shortness of breath and chest tightness for 20+ years.  No prior inhaler use.  Recent chest x-ray was unremarkable.  Patient also has significant cardiac history with paroxysmal atrial fibrillation, coronary artery disease and hypertension.  No longer on lisinopril. . Today's spirometry was normal with no improvement in FEV1 post bronchodilator treatment.  Clinically feeling improved. . The most common causes of chronic cough include the following: upper airway cough syndrome (UACS) which is caused by variety of rhinitis conditions; asthma; gastroesophageal reflux disease (GERD); chronic bronchitis from cigarette smoking or other inhaled environmental irritants; non-asthmatic eosinophilic bronchitis; and bronchiectasis.  . In prospective studies, these conditions have accounted for up to 94% of the causes of chronic cough in immunocompetent adults.  . Given his clinical symptoms, the coughing may be multifactorial in nature. interestingly though it seems to improve after prednisone. . May use albuterol rescue inhaler 2 puffs every 4 to 6 hours as needed for shortness of breath, chest tightness, coughing, and wheezing. Monitor frequency of use.  o Spacer given and demonstrated proper use with inhaler. Patient understood technique and all questions/concerned were addressed.  o If noticing significant improvement and frequent use then will do a trial of maintenance inhaler next.   . Start dymista as above.   Continue with omeprazole 10mg  daily as before - see below for heartburn lifestyle.

## 2020-09-18 NOTE — Patient Instructions (Addendum)
Today's skin testing showed: Positive to tree pollen.  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   Start environmental control measures as below for the tree pollen which blooms in the spring.  Start dymista (fluticasone + azelastine nasal spray combination) 1 spray per nostril twice a day.  This replaces Flonase (fluticasone) for now. If it's not covered let us know.   Nasal saline spray (i.e., Simply Saline) or nasal saline lavage (i.e., NeilMed) is recommended as needed and prior to medicated nasal sprays.  Follow up with ENT as scheduled.   Stop ipratropium for now.   Only use levocetirizine (xyzal) 5mg  once a day if needed.   Coughing:   The most common causes of chronic cough include the following: upper airway cough syndrome (UACS) which is caused by variety of rhinitis conditions; asthma; gastroesophageal reflux disease (GERD); chronic bronchitis from cigarette smoking or other inhaled environmental irritants; non-asthmatic eosinophilic bronchitis; and bronchiectasis.   In prospective studies, these conditions have accounted for up to 94% of the causes of chronic cough in immunocompetent adults.    Continue with omeprazole 10mg  daily as before - see below for heartburn lifestyle.   May use albuterol rescue inhaler 2 puffs or nebulizer every 4 to 6 hours as needed for shortness of breath, chest tightness, coughing, and wheezing. Monitor frequency of use.   Follow up in 2 months or sooner if needed.   Reducing Pollen Exposure  Pollen seasons: trees (spring), grass (summer) and ragweed/weeds (fall).  Keep windows closed in your home and car to lower pollen exposure.   Install air conditioning in the bedroom and throughout the house if possible.   Avoid going out in dry windy days - especially early morning.  Pollen counts are highest between 5 - 10 AM and on dry, hot and windy days.   Save outside activities for late afternoon or after a heavy rain,  when pollen levels are lower.   Avoid mowing of grass if you have grass pollen allergy.  Be aware that pollen can also be transported indoors on people and pets.   Dry your clothes in an automatic dryer rather than hanging them outside where they might collect pollen.   Rinse hair and eyes before bedtime.    Heartburn Heartburn is a type of pain or discomfort that can happen in the throat or chest. It is often described as a burning pain. It may also cause a bad, acid-like taste in the mouth. Heartburn may feel worse when you lie down or bend over. It may be worse at night. It may be caused by stomach contents that move back up (reflux) into the tube that connects the mouth with the stomach (esophagus). Follow these instructions at home: Eating and drinking   Avoid certain foods and drinks as told by your doctor. This may include: ? Coffee and tea (with or without caffeine). ? Drinks that have alcohol. ? Energy drinks and sports drinks. ? Carbonated drinks or sodas. ? Chocolate and cocoa. ? Peppermint and mint flavorings. ? Garlic and onions. ? Horseradish. ? Spicy and acidic foods, such as:  Peppers.  Chili powder and curry powder.  Vinegar.  Hot sauces and BBQ sauce. ? Citrus fruit juices and citrus fruits, such as:  Oranges.  Lemons.  Limes. ? Tomato-based foods, such as:  Red sauce and pizza with red sauce.  Chili.  Salsa. ? Fried and fatty foods, such as:  Donuts.  [place] fries and potato chips.  High-fat dressings. ?  High-fat meats, such as:  Hot dogs and sausage.  Rib eye steak.  Ham and bacon. ? High-fat dairy items, such as:  Whole milk.  Butter.  Cream cheese.  Eat small meals often. Avoid eating large meals.  Avoid drinking large amounts of liquid with your meals.  Avoid eating meals during the 2-3 hours before bedtime.  Avoid lying down right after you eat.  Do not exercise right after you eat. Lifestyle      If you are  overweight, lose an amount of weight that is healthy for you. Ask your doctor about a safe weight loss goal.  Do not use any products that contain nicotine or tobacco, including cigarettes, e-cigarettes, and chewing tobacco. These can make your symptoms worse. If you need help quitting, ask your doctor.  Wear loose clothes. Do not wear anything tight around your waist.  Raise (elevate) the head of your bed about 6 inches (15 cm) when you sleep.  Try to lower your stress. If you need help doing this, ask your doctor. General instructions  Pay attention to any changes in your symptoms.  Take over-the-counter and prescription medicines only as told by your doctor. ? Do not take aspirin, ibuprofen, or other NSAIDs unless your doctor says it is okay. ? Stop medicines only as told by your doctor.  Keep all follow-up visits as told by your doctor. This is important. Contact a doctor if:  You have new symptoms.  You lose weight and you do not know why it is happening.  You have trouble swallowing, or it hurts to swallow.  You have wheezing or a cough that keeps happening.  Your symptoms do not get better with treatment.  You have heartburn often for more than 2 weeks. Get help right away if:  You have pain in your arms, neck, jaw, teeth, or back.  You feel sweaty, dizzy, or light-headed.  You have chest pain or shortness of breath.  You throw up (vomit) and your throw up looks like blood or coffee grounds.  Your poop (stool) is bloody or black. These symptoms may represent a serious problem that is an emergency. Do not wait to see if the symptoms will go away. Get medical help right away. Call your local emergency services (911 in the [place] Do not drive yourself to the hospital. Summary  Heartburn is a type of pain that can happen in the throat or chest. It can feel like a burning pain. It may also cause a bad, acid-like taste in the mouth.  You may need to avoid certain foods  and drinks to help your symptoms. Ask your doctor what foods and drinks you should avoid.  Take over-the-counter and prescription medicines only as told by your doctor. Do not take aspirin, ibuprofen, or other NSAIDs unless your doctor told you to do so.  Contact your doctor if your symptoms do not get better or they get worse. This information is not intended to replace advice given to you by your health care provider. Make sure you discuss any questions you have with your health care provider. Document Revised: 02/20/2018 Document Reviewed: 02/20/2018 [employer] Patient Education  [place].

## 2020-09-18 NOTE — Assessment & Plan Note (Signed)
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  Other triggers include change in temperature.  Tried Flonase, ipratropium and Xyzal with some benefit.  Recent ENT evaluation showed deviated septum and dry nasal passages.  Patient has obstructive sleep apnea and wears a CPAP machine at night. No prior allergy work up.  Today's skin testing showed: Positive to tree pollen only.  Discussed with patient that the above tree pollen allergy does not explain his perennial symptoms. Most likely has a component of non-allergic rhinitis.   Start environmental control measures as below.   Start dymista (fluticasone + azelastine nasal spray combination) 1 spray per nostril twice a day.  This replaces Flonase (fluticasone) for now. If it's not covered let us know.   Nasal saline spray (i.e., Simply Saline) or nasal saline lavage (i.e., NeilMed) is recommended as needed and prior to medicated nasal sprays.  Follow up with ENT as scheduled.  Stop ipratropium for now as it may be drying out the nasal passages too much.   Only use levocetirizine (Xyzal) 5mg  once a day if needed - patient did not notice worsening symptoms since off antihistamines the past 3 days.

## 2020-09-19 ENCOUNTER — Other Ambulatory Visit: Payer: Self-pay | Admitting: Cardiovascular Disease

## 2020-09-19 DIAGNOSIS — G4733 Obstructive sleep apnea (adult) (pediatric): Secondary | ICD-10-CM | POA: Diagnosis not present

## 2020-09-22 ENCOUNTER — Encounter: Payer: Self-pay | Admitting: Adult Health

## 2020-09-22 ENCOUNTER — Ambulatory Visit: Payer: Medicare PPO | Admitting: Adult Health

## 2020-09-22 VITALS — BP 131/78 | HR 65 | Ht 73.5 in | Wt 263.0 lb

## 2020-09-22 DIAGNOSIS — D72810 Lymphocytopenia: Secondary | ICD-10-CM | POA: Diagnosis not present

## 2020-09-22 DIAGNOSIS — G4733 Obstructive sleep apnea (adult) (pediatric): Secondary | ICD-10-CM

## 2020-09-22 DIAGNOSIS — Z8673 Personal history of transient ischemic attack (TIA), and cerebral infarction without residual deficits: Secondary | ICD-10-CM | POA: Diagnosis not present

## 2020-09-22 DIAGNOSIS — Z9989 Dependence on other enabling machines and devices: Secondary | ICD-10-CM

## 2020-09-22 DIAGNOSIS — G4719 Other hypersomnia: Secondary | ICD-10-CM | POA: Diagnosis not present

## 2020-09-22 DIAGNOSIS — R7309 Other abnormal glucose: Secondary | ICD-10-CM | POA: Diagnosis not present

## 2020-09-22 DIAGNOSIS — G250 Essential tremor: Secondary | ICD-10-CM | POA: Diagnosis not present

## 2020-09-22 DIAGNOSIS — E7800 Pure hypercholesterolemia, unspecified: Secondary | ICD-10-CM | POA: Diagnosis not present

## 2020-09-22 DIAGNOSIS — E871 Hypo-osmolality and hyponatremia: Secondary | ICD-10-CM | POA: Diagnosis not present

## 2020-09-22 MED ORDER — TRAZODONE HCL 50 MG PO TABS: 25.0000 mg | ORAL_TABLET | Freq: Every evening | ORAL | 3 refills | Status: DC | PRN

## 2020-09-22 NOTE — Progress Notes (Signed)
[name] [address] [place]. [place] [address] [phone]       OFFICE FOLLOW UP NOTE  Corey [name] Date of Birth:  1945/12/15 Medical Record Number:  [id number]   Referring MD: Corey [name] provider: Dr. Leonie [name]  Reason for Referral: Possible TIA, excessive daytime fatigue and essential tremor   Chief complaint: Chief Complaint  Patient presents with  . Follow-up    Fu, states cpap has not improved his sleep -continued excessive daytime fatigue    HPI:   Today, 09/22/2020, Corey [name] returns for 24-month follow-up after prior visit with Dr. Leonie [name] on 03/17/2020 for transient presyncopal events of unknown etiology (?TIA) as well as essential tremor and excessive daytime fatigue despite adequate usage on CPAP and adequate treatment of OSA.  He was seen by Corey Presto, NP on 08/11/2020 for CPAP compliance visit.  He is greatly frustrated regarding his continued excessive daytime fatigue and lack of identifying underlying cause.  Recently completed radiation for prostate cancer which he admits worsened his fatigue but he has been experiencing excessive fatigue for over the past year.  When awakening in the morning, he does not feel refreshed.  He does awaken at least 1 time nightly for nocturia but does question if he is getting adequate deep restful sleep.  Denies depression or anxiety.  He is currently frustrated as of fatigue limits his ability to be active and socialize.  He does experience good days and bad days.  Tremors have been stable currently on Topamax 25 mg AM and 50 mg p.m. tolerating dosage well without side effects.  He has not had any additional vertigo episodes.  He has not had any additional presyncopal events.  Loop recorder in place.  Remains on Xarelto for stroke prevention and known A. fib with mild bruising but no bleeding.  Also on atorvastatin and fenofibrate for HLD management without side effects.  Blood pressure today 131/78.  No  further concerns at this time.    History provided for reference purposes only Update 03/17/2020 Dr. Leonie [name] :  He returns for follow-up after last visit with [name] practitioner 2 months ago.  He states he had 1 more episode in mid May of brief sensation that he may pass out and felt lightheaded dizzy but he was able to do some deep breathing and that feeling of being out of focus subsided and did not pass out.  He did have an EEG done on 01/21/2020 which was normal.  Patient had a loop recorder inserted   recently but this episode occurred prior to loop recorder.  At last visit he was advised to increase the Topamax for his essential tremor but he did not do so as a friend of his advised him against since he had a bad side effects with higher dose.  Patient however feels the tremors are not bothersome and they are about the same and does not want to increase his medicines.  He remains on Xarelto for stroke prevention which is tolerating well but does complain of some bruising but no bleeding episodes.  He was diagnosed with prostate cancer in April and is presently on hormonal treatment and plans to start radiation treatment later.  He has no other complaints today      Update 01/16/2020 JM: Corey [name] is a 74 year old male who is being seen today, 01/16/2020, for 3 months follow-up in regards to prior strokelike symptoms possibly TIA and essential tremors.  He comes in today with multiple  concerns.  He reports on April 5, he had another episode similar to what occurred in August but increased severity.  He reports driving and had sudden onset of feeling as though he was going to lose consciousness, feeling like his eyes wanted to continue to go back inside, blurred vision, difficulty staying awake and follows all hands were frozen on the steering wheel.  He was able to eventually exit highway and symptoms gradually started improving.  He believes episode lasted approximately 2 minutes but possibly  longer.  He did feel fogginess immediately after for a couple minutes but that resolved.  He believes possibly mild headache but unable to say for sure.  He denies any dizziness sensation, lightheadedness, heart palpitations, shortness of breath or severe headache.  He also reports having episode in February but fortunately was not driving, symptoms were less severe and was able to grab onto the side of his truck for support and symptoms shortly resolved.  He was unable to monitor blood pressure at that time but routinely monitors at home and has been stable as well as stable today at 138/72.  He is concerned regarding possible seizure activity.  Essential tremors: He reports initial improvement after increasing Topamax dosage from 50 mg twice daily to 50mg  AM and 100 PM.  but has been slowly worsening and interferes with daily activity.  He will also have difficulty at times with ambulation and stumbling.  He has tolerated dosage well without difficulty.  Obtain MRI to rule out underlying etiology causing tremors which was unremarkable for acute abnormality or concerning causes.  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  OSA on CPAP: He reports ongoing nightly compliance with CPAP but has not had a sleep study in "numerous years" and is overdue for a new machine.  Previously followed by [facility] but has had difficulty getting in contact with them for follow-up visit.  He reports fatigue which has been slowly worsening and insomnia.  History of stroke: History of left parietal stroke.  Continues on Xarelto for history of atrial fibrillation and secondary stroke prevention without bleeding or bruising.  Continues on atorvastatin without myalgias.  Blood pressure stable.  Denies known worsening stroke/TIA  symptoms.  Vertigo: Reports increased vertigo/dizziness with rapid head movements and lasts only a few seconds and then resolves.  At times episodes can last longer in duration or be more severe.  He does report family history of vertigo.    Update 10/10/2019 JM: Corey [name] is a 74 year old male who is being seen today for 71-month follow-up.  He has been stable from a stroke standpoint without any reoccurring or new stroke/TIA symptoms.  He was initiated on Topamax at prior visit due to likely essential tremor.  He states he has seen some improvement with use of Topamax 50 mg twice daily but he will have episodes where his tremors will be worse with increased fatigue and energy towards the afternoon.  He also endorses worsening with stressful events.  He has also been having difficulty with balance where he will stumble at times, kick the edge of the step when going upstairs and has difficulty making turns while ambulating.  This occurs more with his right foot.  He is overall very frustrated as he is concerned regarding possibly having Parkinson's disease as his brother has been recently diagnosed and he  wants to ensure that he does not have Parkinson's.  He is frustrated as he has not underwent any type of imaging or testing to rule this out.  He continues on Xarelto for secondary stroke prevention history of atrial fibrillation without bleeding or bruising.  Continues on atorvastatin without myalgias.  Blood pressure today 135/73.  He continues to use CPAP for OSA management.  He continues to follow with cardiology regularly.  No concerns at this time.  Initial visit 06/25/2019 Dr. Leonie [name]: Corey [name] is a 74 year old Caucasian male with past medical history of hypertension, hyperlipidemia, coronary artery disease status post PTCA stenting, history of left MCA embolic infarct in 3762 due to atrial fibrillation, obstructive sleep apnea, arthritis, gastroesophageal reflux disease, remote smoking and obesity who is  referred for evaluation for an episode of possible TIA.  History is obtained from the patient, review of electronic medical records and I personally reviewed imaging films in PACS.  Patient states that he was driving in [facility] on 06/02/2019 when all of a sudden he did not feel well.  He felt that he may pass out vision was getting blurred he was having a mild headache.  He managed to pull over the car and ask wife to take over.  He had a portable EKG monitor which showed that his pulse rate was 73 and rhythm was okay.  But his blood pressure was elevated at 195 95.  He waited about 5 minutes and retook the blood pressure it was still high at 185/95.  They decided to go over to the local ER where he had further work-up including checking blood sugar which was 128 mg percent.  Patient at that time was also feeling cold and clammy and sweaty.  He was evaluated at [facility] where CT scan of the head as well as basic lab work were obtained which were all normal.  Patient states that he had somewhat similar episode before he had his previous stroke in 2013.  He has atrial fibrillation and has been taking Xarelto quite faithfully with his dinner every night without fail.  He states he had last lipid profile and A1c checked in December 2019 by his primary physician and they were satisfactory and he is plans to see him next week to have them repeated.  He does use his CPAP every night regularly.  He is currently on a low-carb diet and wants to lose some weight.  He did have a recent tele-visit with his cardiologist who added hydrochlorothiazide for blood pressure control and his blood pressure is doing better now running 831-517 systolic.  The patient has had longstanding history of tremors in his left hand probably 3 years or longer.  He is was diagnosed with essential tremor in fact was referred by me to Dr. Rexene Alberts on 11/07/2017 for second opinion who also felt it was essential tremor rather than  Parkinson's.  Patient did not tolerate primidone well and it made him feel loopy and stopped it.  He also complained of a lot of fatigue and tiredness on Inderal which was discontinued.  He states that he is noticed increasing tremor as well as some drooling.  His brother was recently diagnosed with atypical Parkinson's and patient wonders if he has the same.     ROS:   14 system review of systems is positive for see HPI and all other systems negative  PMH:  Past Medical History:  Diagnosis Date  . Arthritis    "back, right  knee, hands, ankles, neck" (05/31/2016)  . Carotid artery disease ([place]) 08/30/2016   Carotid US 11/19: R 1-39, L 40-59 // Carotid US 08/2019: R 1-39; L 40-59 // Carotid US 11/21: R 1-39; L 40-59>> repeat 1 year   . Chronic lower back pain   . Coronary atherosclerosis of native coronary artery    a. BMS to [facility] 2004 and 2007, otherwise mild nonobstructive disease. EF normal.  . Diverticulitis   . Dyslipidemia   . Essential hypertension, benign   . GERD (gastroesophageal reflux disease)   . Lumbar radiculopathy, chronic 02/04/2015   Right L5  . Obesity   . OSA on CPAP   . Paroxysmal atrial fibrillation ([place])    a. Discovered after stroke.  . Pneumonia 01/1996  . PONV (postoperative nausea and vomiting)   . Prostate CA ([place])   . Recurrent upper respiratory infection (URI)   . Visit for monitoring Tikosyn therapy 09/18/2019    Social History:  Social History   Socioeconomic History  . Marital status: Married    Spouse name: [name]  . Number of children: 3  . Years of education: 37  . Highest education level: Not on file  Occupational History  . Occupation: Retired    Fish farm manager: DISABLED     Comment: Works at [employer] part time  Tobacco Use  . Smoking status: Former Smoker    Packs/day: 1.00    Years: 34.00    Pack years: 34.00    Types: Cigarettes    Quit date: 05/05/2003    Years since quitting: 17.3  . Smokeless tobacco: Never Used  Vaping Use  .  Vaping Use: Never used  Substance and Sexual Activity  . Alcohol use: No    Alcohol/week: 0.0 standard drinks  . Drug use: No  . Sexual activity: Not Currently  Other Topics Concern  . Not on file  Social History Narrative   Patient lives at home with spouse.   Caffeine Use:    Social Determinants of Health   Financial Resource Strain: Not on file  Food Insecurity: Not on file  Transportation Needs: Not on file  Physical Activity: Not on file  Stress: Not on file  Social Connections: Not on file  Intimate Partner Violence: Not on file    Medications:   Current Outpatient Medications on File Prior to Visit  Medication Sig Dispense Refill  . acetaminophen (TYLENOL 8 HOUR ARTHRITIS PAIN) 650 MG CR tablet Take 1,300 mg by mouth as needed for pain (pain).     Marland Kitchen albuterol (VENTOLIN HFA) 108 (90 Base) MCG/ACT inhaler Inhale 2 puffs into the lungs every 4 (four) hours as needed for wheezing or shortness of breath (coughing fits). 8 g 2  . ALPRAZolam (XANAX) 0.5 MG tablet Take 1 tablet (0.5 mg total) by mouth as needed for anxiety (take one tablet 30 minutes prior to MRI and may repeat once, just prior to scan if needed). 2 tablet 0  . atorvastatin (LIPITOR) 40 MG tablet TAKE 1 TABLET BY MOUTH EVERY DAY 90 tablet 2  . azelastine (ASTELIN) 0.1 % nasal spray Place 2 sprays into both nostrils 2 (two) times daily. 30 mL 5  . clindamycin (CLEOCIN) 300 MG capsule clindamycin HCl 300 mg capsule  TAKE 2 CAPSULES BY MOUTH 1 HOUR PRIOR TO DENTAL TREATMENT    . diltiazem (CARDIZEM) 30 MG tablet Take 1 tablet (30 mg total) by mouth 4 (four) times daily as needed. 60 tablet 6  . docusate sodium (COLACE) 100 MG  capsule Take 100 mg by mouth 2 (two) times daily.    Marland Kitchen dofetilide (TIKOSYN) 500 MCG capsule TAKE 1 CAPSULE BY MOUTH TWICE A DAY 180 capsule 1  . fenofibrate 160 MG tablet Take 1 tablet (160 mg total) by mouth daily. Please keep upcoming appt in March 2022 with Dr. Acie Fredrickson before anymore refills.  Thank you 90 tablet 0  . fluticasone (FLONASE) 50 MCG/ACT nasal spray Place 2 sprays into both nostrils daily. 18.2 mL 5  . leuprolide, 6 Month, (ELIGARD) 45 MG injection Inject 45 mg into the skin every 6 (six) months.    . levocetirizine (XYZAL) 5 MG tablet Take 5 mg by mouth every evening.    . loperamide (IMODIUM) 2 MG capsule Take 2 mg by mouth as needed for diarrhea or loose stools.    . magnesium oxide (MAG-OX) 400 MG tablet TAKE 1 TABLET BY MOUTH EVERY DAY 90 tablet 2  . metoprolol succinate (TOPROL XL) 25 MG 24 hr tablet Take 1 tablet (25 mg total) by mouth daily. 90 tablet 3  . Multiple Vitamins-Minerals (MULTIVITAMINS THER. W/MINERALS) TABS Take 1 tablet by mouth daily.    . nitroGLYCERIN (NITROSTAT) 0.4 MG SL tablet Place 1 tablet (0.4 mg total) under the tongue every 5 (five) minutes as needed for chest pain. 25 tablet 5  . omeprazole (PRILOSEC) 10 MG capsule Take 1 capsule (10 mg total) by mouth daily. 90 capsule 3  . potassium chloride (KLOR-CON) 10 MEQ tablet Take 1 tablet (10 mEq total) by mouth daily. Please make yearly appt with Dr. Acie Fredrickson for December for future refills. 1st attempt 90 tablet 1  . Saccharomyces boulardii (PROBIOTIC) 250 MG CAPS 1 capsule    . tamsulosin (FLOMAX) 0.4 MG CAPS capsule Take 0.4 mg by mouth daily.    Marland Kitchen topiramate (TOPAMAX) 50 MG tablet Take 2 tablets (100 mg total) by mouth 2 (two) times daily. (Patient taking differently: Take 100 mg by mouth. Take 2 tablets by mouth in the AM and 1 tablet by mouth in the evening) 360 tablet 3  . XARELTO 20 MG TABS tablet TAKE 1 TABLET BY MOUTH EVERY DAY 90 tablet 3  . lisinopril (ZESTRIL) 10 MG tablet TAKE 1 TABLET BY MOUTH EVERY DAY (Patient not taking: Reported on 09/22/2020) 90 tablet 1   No current facility-administered medications on file prior to visit.    Allergies:   Allergies  Allergen Reactions  . Amoxicillin Swelling  . Fosinopril Other (See Comments)  . Hydromorphone Other (See Comments) and  Nausea Only  . Ampicillin Rash  . Codeine Nausea Only and Other (See Comments)    Heavy amounts cause nausea  . Monopril [Fosinopril Sodium] Other (See Comments)    Muscle aches & pain  . Percocet [Oxycodone-Acetaminophen] Nausea And Vomiting    OBJECTIVE:   Vitals:  Today's Vitals   09/22/20 1026  BP: 131/78  Pulse: 65  Weight: 263 lb (119.3 kg)  Height: 6' 1.5" (1.867 m)   Body mass index is 34.23 kg/m.   Physical exam:  General: well developed, well nourished, pleasant elderly Caucasian male, seated, in no evident distress Head: head normocephalic and atraumatic.   Neck: supple with no carotid or supraclavicular bruits Cardiovascular: regular rate and rhythm, no murmurs Musculoskeletal: no deformity Skin:  no rash/petichiae Vascular:  Normal pulses all extremities  Neurologic Exam Mental Status: Awake and fully alert. Normal speech and language. Oriented to place and time. Recent and remote memory intact. Attention span, concentration and fund of knowledge  appropriate. Mood and affect appropriate.  Cranial Nerves: Pupils equal, briskly reactive to light.  Esotropia right eye.  Extraocular movements full without nystagmus. Visual fields full to confrontation. Hearing intact. Facial sensation intact. Face, tongue, palate moves normally and symmetrically.  Motor: Normal bulk and tone. Normal strength in all tested extremity muscles.  Mild action tremor L>R.  No tremors observed at rest.  Mild cogwheel rigidity at the left wrist upon activation only.  Glabellar tap is negative.  No decreased facial expression or bradykinesia. Sensory.: intact to touch , pinprick , position and vibratory sensation.  Coordination: Rapid alternating movements normal in all extremities.  Finger-to-nose and heel-to-shin performed accurately bilaterally. Gait and Station: Arises from chair without difficulty. Stance is normal. Gait demonstrates normal stride length.  Difficulty performing heel, toe  and tandem walk.  No gait festination or stooped posture.  Normal arm swing but does have mild difficulty with turns Reflexes: 1+ RUE and RLE, diminished LUE and LLE. Toes downgoing.       ASSESSMENT: 74 year old Caucasian male with transient episode of presyncopal symptoms initially in August 2020 and with increased severity most recently 01/16/2020 (see HPI).  Patient had somewhat similar initial symptoms in October 2013 followed by speech difficulty and right-sided weakness and left MCA branch infarct from atrial fibrillation.  Vascular risk factors of obesity, hypertension, hyperlipidemia, atrial fibrillation, OSA on CPAP and cardiac disease.  He also has 3-year history of left>right upper extremity action tremor which appears to be more likely essential tremor versus Parkinson's or parkinsonian syndrome.  He also has concerns regarding vertigo and question BPPV.    PLAN:  Excessive daytime fatigue  -Currently of unclear etiology  -will check vitamin B12 level as this may contribute to fatigue if deficient  -Discussed possible nonrestorative sleep contributing to fatigue.  Recommend trialing low-dose trazodone 25 mg nightly as needed for possible benefit.  Discussed potential side effects such as morning dizziness, fatigue, drowsiness, QTC prolongation (recent EKG reporting QTC 440) and hypotension.   -Recently completed radiation for prostate cancer and currently being followed for anemia  -Advised him that he may schedule follow-up visit with Dr. Brett Fairy to further discuss continued fatigue concerns but unfortunately, there is no further work-up indicated from a neurological standpoint  OSA on CPAP  -Recent visit with Corey Presto, NP with excellent compliance and adequate residual AHI  -Encouraged continued nightly use  Presyncopal events:  -Currently of unclear etiology  -No recent episode  -EEG negative  -Loop recorder in place  Essential tremors:  -Stable  -Continue topiramate  25 mg a.m. and 50 mg p.m.  TIA/stroke history:   -Continuation of Xarelto and atorvastatin with history of atrial fibrillation and secondary stroke  prevention.    -Continue to follow with PCP for HTN and HLD management.  -Continue to follow with cardiology for atrial fibrillation management  -maintain strict control of hypertension with blood pressure goal below 130/90 and lipids with LDL cholesterol goal below 70 mg percent      Follow-up in 4 months or call earlier if needed   I spent 45 minutes of face-to-face and non-face-to-face time with patient.  This included previsit chart review, lab review, study review, order entry, electronic health record documentation, discussion regarding recurrent episodes of unknown etiology, prior history of stroke, excessive daytime fatigue, OSA on CPAP, likely essential tremors and answered all other questions to patient's satisfaction   Frann Rider, [facility]  [facility] [address] [place], [place] [address]  Phone  [phone] Fax [phone] Note: This document was prepared with digital dictation and possible smart phrase technology. Any transcriptional errors that result from this process are unintentional.

## 2020-09-22 NOTE — Patient Instructions (Addendum)
Check B12 level today as this may be contributing to your day time fatigue  Use of trazodone 25mg  nightly as needed to help with sleep     Follow up in 4 months or call earlier if needed

## 2020-09-23 DIAGNOSIS — F5101 Primary insomnia: Secondary | ICD-10-CM | POA: Diagnosis not present

## 2020-09-23 DIAGNOSIS — I10 Essential (primary) hypertension: Secondary | ICD-10-CM | POA: Diagnosis not present

## 2020-09-23 DIAGNOSIS — D72810 Lymphocytopenia: Secondary | ICD-10-CM | POA: Diagnosis not present

## 2020-09-23 DIAGNOSIS — I480 Paroxysmal atrial fibrillation: Secondary | ICD-10-CM | POA: Diagnosis not present

## 2020-09-23 DIAGNOSIS — E7800 Pure hypercholesterolemia, unspecified: Secondary | ICD-10-CM | POA: Diagnosis not present

## 2020-09-23 DIAGNOSIS — Z0001 Encounter for general adult medical examination with abnormal findings: Secondary | ICD-10-CM | POA: Diagnosis not present

## 2020-09-23 DIAGNOSIS — I714 Abdominal aortic aneurysm, without rupture: Secondary | ICD-10-CM | POA: Diagnosis not present

## 2020-09-23 DIAGNOSIS — I2510 Atherosclerotic heart disease of native coronary artery without angina pectoris: Secondary | ICD-10-CM | POA: Diagnosis not present

## 2020-09-23 DIAGNOSIS — D6869 Other thrombophilia: Secondary | ICD-10-CM | POA: Diagnosis not present

## 2020-09-23 LAB — VITAMIN B12: Vitamin B-12: 582 pg/mL (ref 232–1245)

## 2020-09-23 NOTE — Progress Notes (Signed)
I agree with the above plan 

## 2020-09-25 ENCOUNTER — Telehealth: Payer: Self-pay | Admitting: Hematology

## 2020-09-25 NOTE — Telephone Encounter (Signed)
Received a new hem referral from Dr. Dorthy Cooler for persisent leukopenia/lymphopenia. Corey [name] has been cld and scheduled to see Dr. Irene Limbo on 1/5 at 11am. Pt aware to arrive 30 minutes early.

## 2020-09-29 ENCOUNTER — Telehealth: Payer: Self-pay

## 2020-09-29 ENCOUNTER — Ambulatory Visit (HOSPITAL_COMMUNITY): Payer: Medicare PPO | Admitting: Physician Assistant

## 2020-09-29 NOTE — Progress Notes (Signed)
Carelink Summary Report / Loop Recorder 

## 2020-09-29 NOTE — Telephone Encounter (Signed)
-----   Message from Frann Rider, NP sent at 09/23/2020 12:15 PM EST ----- Please advise patient that his B12 level is within the normal range but on the lower side of normal.  Please advise him that he may benefit from taking a B12 supplement I would further discuss this with your PCP and oncologist.  If he wishes to pursue further evaluation of continued fatigue, would recommend scheduling follow-up visit with Dr. Brett Fairy to further discuss this.  Thank you.

## 2020-09-29 NOTE — Telephone Encounter (Signed)
Attempted to call pt, LVM for B12 results per DPR. Ask pt to call back for questions or concerns.

## 2020-10-06 ENCOUNTER — Other Ambulatory Visit: Payer: Self-pay | Admitting: Cardiovascular Disease

## 2020-10-06 DIAGNOSIS — I480 Paroxysmal atrial fibrillation: Secondary | ICD-10-CM

## 2020-10-06 DIAGNOSIS — G4733 Obstructive sleep apnea (adult) (pediatric): Secondary | ICD-10-CM | POA: Diagnosis not present

## 2020-10-06 NOTE — Telephone Encounter (Signed)
Prescription refill request for Xarelto received.  Indication: a fib Last office visit: 09/11/20 Weight: 119kg Age: 75 Scr: 0.97 CrCl: 178mL/min

## 2020-10-07 NOTE — Progress Notes (Signed)
Assessment/Plan:   1.  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  This can certainly happen with longstanding and severe essential tremor, but he really does not have this.  In addition, his identical twin presented in a similar fashion years ago, and now has Parkinson's disease.  I did tell him that even if his DaTscan was abnormal, he still does not have Parkinson's disease, because that is a clinical syndrome and he does not meet [place] brain bank or modified MDS criteria for that right now.  He understands that.   2.  OSAS  -following with Dr. Brett Fairy  3.  Leukopenia/lymphopenia  -pt just saw hematology on 1/5 but notes not completed yet  Subjective:   Corey [name] was seen today in the movement disorders clinic for neurologic consultation at the request of Koirala, Dibas, MD.  The consultation is for the evaluation of tremor and balance issue.  This is a second opinion.  Patient currently under the care of [facility] neurology.  Patient's twin brother has Parkinson's disease and patient is concerned that he may have the same.   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  Patient subsequently placed on Xarelto.  Patient seen by Dr. Rexene Alberts in February, 2019, due to the fact that his brother had recently been diagnosed with parkinsonism.  Dr. Rexene Alberts assured him that he had essential tremor.  No treatment was recommended at that time.  He saw the nurse practitioner in January, 2021 and expressed frustration that he felt that he had not had testing for Parkinson's disease.  MRI brain was recommended and it was recommended that he increase Topamax to 50 mg in the morning and 200 mg in the evening due to essential tremor.  Patient had MRI brain at [facility] on December 07, 2019.  I only have the report.  This was reported to show atrophy, chronic ischemic change and old left parietal  infarct.  Patient has been having episodes, presumed to have been BPPV by the nurse practitioner that saw him.  He had an episode in August, 1 in February and one episode in April, 2021.  With the episode in April, he reports that he was driving and felt that he was going to pass out.  He felt like he was having blurred vision and difficulty staying awake.  He exited the highway and symptoms improved.  The entire event lasted about 2 minutes.  He had an EEG that was unremarkable.  It was felt that cardiac etiology was more likely than a neurologic etiology and his cardiologist (Dr. Rayann Heman) was contacted.  A loop recorder was subsequently implanted.  Pt states that he feels near syncopal.  He has no SOB.  He feels apprehensive.  He has racing of the heart but thinks that is because he is nervous about what happens.  He can speak during an episode.  It will last a few min but will be about 15 min before he is completely normal again.  He has had no event since the loop recorder was placed.   Patient was last seen at [facility] neurology in December, 2021.  Pt states that he stumbles and has trouble with depth perception and wants to be re-evaluated for Parkinsons Disease.    Tremor: Yes.     How long has it been going on? Late 2018  At rest or  with activation?  Picking up screwdriver  Fam hx of tremor?  Yes.    Located where?  Bilateral UE.  He is L hand dominant  Affected by caffeine:  Doesn't drink enough to know (drinks decaf)  Affected by alcohol:  Doesn't drink alcohol  Affected by stress:  Yes.    Affected by fatigue:  Yes.    Spills soup if on spoon:  No.  Other Specific Symptoms:  Voice: gets crackly at times Sleep: On CPAP for sleep apnea.  Follows with Dr. Brett Fairy  Vivid Dreams:  No.  Acting out dreams:  No. Postural symptoms:  Yes.    Falls?  No., but multiple near falls Bradykinesia symptoms: difficulty regaining balance; some dragging of the feet Loss of smell:  No. Loss of  taste:  No. Urinary Incontinence:  No.  Has urgency as is 9 weeks post radiation for prostate CA Difficulty Swallowing:  No. Handwriting, micrographia: Yes.   Trouble with ADL's:  No.  Trouble buttoning clothing: Yes.   Depression:  No. but admits to anxiety Memory changes:  No. N/V:  No. Diplopia:  No.   ALLERGIES:   Allergies  Allergen Reactions  . Amoxicillin Swelling  . Fosinopril Other (See Comments)  . Hydromorphone Other (See Comments) and Nausea Only  . Ampicillin Rash  . Codeine Nausea Only and Other (See Comments)    Heavy amounts cause nausea  . Monopril [Fosinopril Sodium] Other (See Comments)    Muscle aches & pain  . Percocet [Oxycodone-Acetaminophen] Nausea And Vomiting    CURRENT MEDICATIONS:  Current Outpatient Medications  Medication Instructions  . albuterol (VENTOLIN HFA) 108 (90 Base) MCG/ACT inhaler 2 puffs, Inhalation, Every 4 hours PRN  . ALPRAZolam (XANAX) 0.5 mg, Oral, As needed  . atorvastatin (LIPITOR) 40 MG tablet TAKE 1 TABLET BY MOUTH EVERY DAY  . azelastine (ASTELIN) 0.1 % nasal spray 2 sprays, Each Nare, 2 times daily  . clindamycin (CLEOCIN) 300 MG capsule Take 300 mg by mouth. Uses for dental procedures  . diltiazem (CARDIZEM) 30 mg, Oral, 4 times daily PRN  . docusate sodium (COLACE) 100 mg, Oral, Daily  . dofetilide (TIKOSYN) 500 MCG capsule TAKE 1 CAPSULE BY MOUTH TWICE A DAY  . fenofibrate 160 mg, Oral, Daily, Please keep upcoming appt in March 2022 with Dr. Acie Fredrickson before anymore refills. Thank you  . fluticasone (FLONASE) 50 MCG/ACT nasal spray 2 sprays, Each Nare, Daily  . leuprolide (6 Month) (ELIGARD) 45 mg, Subcutaneous, Every 6 months  . levocetirizine (XYZAL) 5 mg, Oral, Every evening  . lisinopril (ZESTRIL) 10 MG tablet TAKE 1 TABLET BY MOUTH EVERY DAY  . loperamide (IMODIUM) 2 mg, Oral, As needed  . magnesium oxide (MAG-OX) 400 MG tablet TAKE 1 TABLET BY MOUTH EVERY DAY  . metoprolol succinate (TOPROL XL) 25 mg, Oral, Daily   . Multiple Vitamins-Minerals (MULTIVITAMINS THER. W/MINERALS) TABS 1 tablet, Oral, Daily,    . nitroGLYCERIN (NITROSTAT) 0.4 mg, Sublingual, Every 5 min PRN  . omeprazole (PRILOSEC) 10 mg, Oral, Daily  . potassium chloride (KLOR-CON) 10 MEQ tablet 10 mEq, Oral, Daily, Please make yearly appt with Dr. Acie Fredrickson for December for future refills. 1st attempt  . Saccharomyces boulardii (PROBIOTIC) 250 MG CAPS 1 capsule, Oral, Daily  . tamsulosin (FLOMAX) 0.4 mg, Oral, Daily,    . topiramate (TOPAMAX) 100 mg, Oral, 2 times daily  . traZODone (DESYREL) 25 mg, Oral, At bedtime PRN  . Tylenol 8 Hour Arthritis Pain 1,300 mg, Oral, As needed  .  XARELTO 20 MG TABS tablet TAKE 1 TABLET BY MOUTH EVERY DAY    Objective:   PHYSICAL EXAMINATION:    VITALS:   Vitals:   10/09/20 1332  BP: 130/70  Pulse: 78  SpO2: 98%  Weight: 266 lb (120.7 kg)  Height: 6' 1.5" (1.867 m)    GEN:  The patient appears stated age and is in NAD. HEENT:  Normocephalic, atraumatic.  The mucous membranes are moist. The superficial temporal arteries are without ropiness or tenderness. CV:  RRR Lungs:  CTAB Neck/HEME:  There are no carotid bruits bilaterally.  Neurological examination:  Orientation: The patient is alert and oriented x3.  Cranial nerves: There is good facial symmetry.  Extraocular muscles are intact. The visual fields are full to confrontational testing. The speech is fluent and clear. Soft palate rises symmetrically and there is no tongue deviation. Hearing is intact to conversational tone. Sensation: Sensation is intact to light touch throughout (facial, trunk, extremities). Vibration is intact at the bilateral big toe. There is no extinction with double simultaneous stimulation.  Motor: Strength is 5/5 in the bilateral upper and lower extremities.   Shoulder shrug is equal and symmetric.  There is no pronator drift. Deep tendon reflexes: Deep tendon reflexes are 2-/4 at the bilateral biceps, triceps,  brachioradialis, patella and achilles. Plantar responses are downgoing bilaterally.  Movement examination: Tone: There is nl tone in the bilateral upper extremities.  The tone in the lower extremities is nl.  Abnormal movements: there is mild LUE rest tremor.  There is postural tremor, L>R.  He has some tremor with Archimedes spirals.  He has mild trouble pouring water from 1 glass to another when the water is in the left hand, but he does not spill any of the water. Coordination:  There is no decremation with RAM's, with any form of RAMS, including alternating supination and pronation of the forearm, hand opening and closing, finger taps, heel taps and toe taps. Gait and Station: The patient has mild difficulty arising out of a deep-seated chair without the use of the hands. The patient's stride length is good.   I have reviewed and interpreted the following labs independently   Chemistry      Component Value Date/Time   NA 140 10/08/2020 1213   NA 135 06/25/2020 1336   K 4.3 10/08/2020 1213   CL 110 10/08/2020 1213   CO2 22 10/08/2020 1213   BUN 21 10/08/2020 1213   BUN 19 06/25/2020 1336   CREATININE 1.06 10/08/2020 1213   CREATININE 1.04 08/30/2016 0840      Component Value Date/Time   CALCIUM 9.8 10/08/2020 1213   ALKPHOS 50 10/08/2020 1213   AST 23 10/08/2020 1213   ALT 28 10/08/2020 1213   BILITOT 0.6 10/08/2020 1213      Lab Results  Component Value Date   TSH 2.310 07/10/2019   Lab Results  Component Value Date   WBC 5.0 10/08/2020   HGB 11.7 (L) 10/08/2020   HCT 36.1 (L) 10/08/2020   MCV 96.3 10/08/2020   PLT 240 10/08/2020    Lab Results  Component Value Date   VITAMINB12 582 09/22/2020     Total time spent on today's visit was  60 minutes, including both face-to-face time and nonface-to-face time.  Time included that spent on review of records (prior notes available to me/labs/imaging if pertinent), discussing treatment and goals, answering patient's  questions and coordinating care.  Cc:  [name], MD

## 2020-10-08 ENCOUNTER — Other Ambulatory Visit: Payer: Self-pay

## 2020-10-08 ENCOUNTER — Inpatient Hospital Stay: Payer: Medicare PPO | Attending: Radiation Oncology | Admitting: Hematology

## 2020-10-08 ENCOUNTER — Encounter: Payer: Self-pay | Admitting: Hematology

## 2020-10-08 ENCOUNTER — Inpatient Hospital Stay: Payer: Medicare PPO

## 2020-10-08 ENCOUNTER — Telehealth: Payer: Self-pay | Admitting: Hematology

## 2020-10-08 VITALS — BP 153/79 | HR 79 | Temp 97.8°F | Resp 18 | Ht 73.0 in | Wt 266.0 lb

## 2020-10-08 DIAGNOSIS — Z87891 Personal history of nicotine dependence: Secondary | ICD-10-CM

## 2020-10-08 DIAGNOSIS — D649 Anemia, unspecified: Secondary | ICD-10-CM | POA: Diagnosis not present

## 2020-10-08 DIAGNOSIS — Z79899 Other long term (current) drug therapy: Secondary | ICD-10-CM | POA: Diagnosis not present

## 2020-10-08 DIAGNOSIS — Z8673 Personal history of transient ischemic attack (TIA), and cerebral infarction without residual deficits: Secondary | ICD-10-CM | POA: Diagnosis not present

## 2020-10-08 DIAGNOSIS — E559 Vitamin D deficiency, unspecified: Secondary | ICD-10-CM | POA: Diagnosis not present

## 2020-10-08 DIAGNOSIS — G250 Essential tremor: Secondary | ICD-10-CM | POA: Diagnosis not present

## 2020-10-08 DIAGNOSIS — E669 Obesity, unspecified: Secondary | ICD-10-CM | POA: Diagnosis not present

## 2020-10-08 DIAGNOSIS — R531 Weakness: Secondary | ICD-10-CM | POA: Diagnosis not present

## 2020-10-08 DIAGNOSIS — C61 Malignant neoplasm of prostate: Secondary | ICD-10-CM | POA: Insufficient documentation

## 2020-10-08 DIAGNOSIS — Z923 Personal history of irradiation: Secondary | ICD-10-CM | POA: Diagnosis not present

## 2020-10-08 DIAGNOSIS — D72810 Lymphocytopenia: Secondary | ICD-10-CM | POA: Insufficient documentation

## 2020-10-08 DIAGNOSIS — I10 Essential (primary) hypertension: Secondary | ICD-10-CM | POA: Diagnosis not present

## 2020-10-08 DIAGNOSIS — I480 Paroxysmal atrial fibrillation: Secondary | ICD-10-CM | POA: Diagnosis not present

## 2020-10-08 DIAGNOSIS — R0602 Shortness of breath: Secondary | ICD-10-CM | POA: Diagnosis not present

## 2020-10-08 DIAGNOSIS — Z8546 Personal history of malignant neoplasm of prostate: Secondary | ICD-10-CM | POA: Diagnosis not present

## 2020-10-08 DIAGNOSIS — Z7901 Long term (current) use of anticoagulants: Secondary | ICD-10-CM | POA: Diagnosis not present

## 2020-10-08 DIAGNOSIS — I2510 Atherosclerotic heart disease of native coronary artery without angina pectoris: Secondary | ICD-10-CM | POA: Insufficient documentation

## 2020-10-08 LAB — CMP (CANCER CENTER ONLY)
ALT: 28 U/L (ref 0–44)
AST: 23 U/L (ref 15–41)
Albumin: 3.8 g/dL (ref 3.5–5.0)
Alkaline Phosphatase: 50 U/L (ref 38–126)
Anion gap: 8 (ref 5–15)
BUN: 21 mg/dL (ref 8–23)
CO2: 22 mmol/L (ref 22–32)
Calcium: 9.8 mg/dL (ref 8.9–10.3)
Chloride: 110 mmol/L (ref 98–111)
Creatinine: 1.06 mg/dL (ref 0.61–1.24)
GFR, Estimated: >60 mL/min (ref >60)
Glucose, Bld: 111 mg/dL — ABNORMAL HIGH (ref 70–99)
Potassium: 4.3 mmol/L (ref 3.5–5.1)
Sodium: 140 mmol/L (ref 135–145)
Total Bilirubin: 0.6 mg/dL (ref 0.3–1.2)
Total Protein: 7.1 g/dL (ref 6.5–8.1)

## 2020-10-08 LAB — HIV ANTIBODY (ROUTINE TESTING W REFLEX): HIV Screen 4th Generation wRfx: NONREACTIVE

## 2020-10-08 LAB — CBC WITH DIFFERENTIAL/PLATELET
Abs Immature Granulocytes: 0.07 10*3/uL (ref 0.00–0.07)
Basophils Absolute: 0.1 10*3/uL (ref 0.0–0.1)
Basophils Relative: 1 %
Eosinophils Absolute: 0.3 10*3/uL (ref 0.0–0.5)
Eosinophils Relative: 5 %
HCT: 36.1 % — ABNORMAL LOW (ref 39.0–52.0)
Hemoglobin: 11.7 g/dL — ABNORMAL LOW (ref 13.0–17.0)
Immature Granulocytes: 1 %
Lymphocytes Relative: 12 %
Lymphs Abs: 0.6 10*3/uL — ABNORMAL LOW (ref 0.7–4.0)
MCH: 31.2 pg (ref 26.0–34.0)
MCHC: 32.4 g/dL (ref 30.0–36.0)
MCV: 96.3 fL (ref 80.0–100.0)
Monocytes Absolute: 0.6 10*3/uL (ref 0.1–1.0)
Monocytes Relative: 13 %
Neutro Abs: 3.4 10*3/uL (ref 1.7–7.7)
Neutrophils Relative %: 68 %
Platelets: 240 10*3/uL (ref 150–400)
RBC: 3.75 MIL/uL — ABNORMAL LOW (ref 4.22–5.81)
RDW: 13.9 % (ref 11.5–15.5)
WBC: 5 10*3/uL (ref 4.0–10.5)
nRBC: 0 % (ref 0.0–0.2)

## 2020-10-08 LAB — LACTATE DEHYDROGENASE: LDH: 169 U/L (ref 98–192)

## 2020-10-08 LAB — VITAMIN D 25 HYDROXY (VIT D DEFICIENCY, FRACTURES): Vit D, 25-Hydroxy: 19.48 ng/mL — ABNORMAL LOW (ref 30–100)

## 2020-10-08 NOTE — Progress Notes (Signed)
HEMATOLOGY/ONCOLOGY CONSULTATION NOTE  Date of Service: 10/08/2020  Patient Care Team: [name], MD as PCP - General (Family Medicine) Nahser, Wonda Cheng, MD as PCP - Cardiology (Cardiology) Nahser, Wonda Cheng, MD (Cardiology) Cira Rue, RN Nurse Navigator as Registered Nurse (Medical Oncology)  CHIEF COMPLAINTS/PURPOSE OF CONSULTATION:  Persistent Leukopenia/Lymphopenia   HISTORY OF PRESENTING ILLNESS:   Corey [name] is a wonderful 75 y.o. male who has been referred to [place] by Dr. Dorthy Cooler for evaluation and management of persisent leukopenia/lymphopenia. The pt reports that he is doing well overall.   The pt reports that he has been having significant fatigue since August of 2020. The pt has been seen by an allergist and completed a repeat sleep study. He is using his CPAP machine as recommended. His is getting an appropriate amount of rest but often wakes feeling like he hasn't rested at all. The pt reports that he is mentally alert and has no issues falling asleep while driving. Pt also often feels that he is about to pass out during activity. The pt has shortness of breath second to his fatigue and weakness. He has recently had a stress test and pulmonary function testing. His Afib has been well-controlled since starting Tikosyn. He admits that his fatigue and SOB worsened noticeably after beginning radiation treatment. Pt also notes shakiness and unsteadiness when walking. This has been evaluated by [facility] but the pt is scheduled for a second opinion with [place] tomorrow. Pt denies any depressive moods at this time.  Pt has completed radiation therapy for his Prostate cancer and has one more dose of Eligard scheduled for June of 2022. Pt began Topomax about one year ago for his Essential Tremors, which have been stable at his current dose. There has been no concern that the pt has Parkinson's. The pt is taking 500 mcg Vitamin B12 daily.   Most recent lab results  (09/23/2020) shows: WBC at 4.0K, Lymphs Abs at 500.   On review of systems, pt reports fatigue, SOB, weakness, imbalance, hot flashes, leg swelling and denies mental fogginess, depressed mood, muscle pain/weakness, abdominal pain and any other symptoms.   On PMHx the pt reports Essential Tremors, Prostate Cancer, PAF, Sleep Apnea, Lumbar Radiculopathy, Dyslipidemia. On Family Hx the pt reports a brother with Parkinson's disease.   MEDICAL HISTORY:  Past Medical History:  Diagnosis Date  . Arthritis    "back, right knee, hands, ankles, neck" (05/31/2016)  . Carotid artery disease ([place]) 08/30/2016   Carotid US 11/19: R 1-39, L 40-59 // Carotid US 08/2019: R 1-39; L 40-59 // Carotid US 11/21: R 1-39; L 40-59>> repeat 1 year   . Chronic lower back pain   . Coronary atherosclerosis of native coronary artery    a. BMS to [facility] 2004 and 2007, otherwise mild nonobstructive disease. EF normal.  . Diverticulitis   . Dyslipidemia   . Essential hypertension, benign   . GERD (gastroesophageal reflux disease)   . Lumbar radiculopathy, chronic 02/04/2015   Right L5  . Obesity   . OSA on CPAP   . Paroxysmal atrial fibrillation ([place])    a. Discovered after stroke.  . Pneumonia 01/1996  . PONV (postoperative nausea and vomiting)   . Prostate CA ([place])   . Recurrent upper respiratory infection (URI)   . Visit for monitoring Tikosyn therapy 09/18/2019    SURGICAL HISTORY: Past Surgical History:  Procedure Laterality Date  . ADENOIDECTOMY    . ANTERIOR CERVICAL DECOMP/DISCECTOMY FUSION  07/2001; 10/2002   "  C5-6; C6-7; redo"  . BACK SURGERY    . CARPAL TUNNEL RELEASE Left 10/2015  . COLONOSCOPY W/ POLYPECTOMY  02/2014  . CORONARY ANGIOPLASTY WITH STENT PLACEMENT  05/2003; 12/2005   "mid RCA; mid RCA"  . HAND SURGERY  02/2019   LEFT HAND  . implantable loop recorder placement  02/27/2020   Medtronic Reveal [place] model [id number]  implantable loop recorder implanted by Dr Rayann Heman for afib  management and evaluation of presyncope  . JOINT REPLACEMENT    . KNEE ARTHROSCOPY Left 10/2005  . KNEE ARTHROSCOPY W/ PARTIAL MEDIAL MENISCECTOMY Left 09/2005  . LUMBAR LAMINECTOMY/DECOMPRESSION MICRODISCECTOMY  03/2005   "L4-5"  . POSTERIOR LUMBAR FUSION  10/2003   L5-S1; "plates, screws"  . SHOULDER ARTHROSCOPY Right 08/2011   Debridement of labrum, arthroscopic distal clavicle excision  . SHOULDER OPEN ROTATOR CUFF REPAIR Left 07/2014  . TEE WITHOUT CARDIOVERSION  07/07/2012   Procedure: TRANSESOPHAGEAL ECHOCARDIOGRAM (TEE);  Surgeon: Fay Records, MD;  Location: [place];  Service: Cardiovascular;  Laterality: N/A;  . TONSILLECTOMY AND ADENOIDECTOMY  ~ 1956  . TOTAL KNEE ARTHROPLASTY Left 10/2006  . TRIGGER FINGER RELEASE Left 10/2015    SOCIAL HISTORY: Social History   Socioeconomic History  . Marital status: Married    Spouse name: [name]  . Number of children: 3  . Years of education: 75  . Highest education level: Not on file  Occupational History  . Occupation: Retired    Fish farm manager: DISABLED     Comment: Works at [employer] part time  Tobacco Use  . Smoking status: Former Smoker    Packs/day: 1.00    Years: 34.00    Pack years: 34.00    Types: Cigarettes    Quit date: 05/05/2003    Years since quitting: 17.4  . Smokeless tobacco: Never Used  Vaping Use  . Vaping Use: Never used  Substance and Sexual Activity  . Alcohol use: No    Alcohol/week: 0.0 standard drinks  . Drug use: No  . Sexual activity: Not Currently  Other Topics Concern  . Not on file  Social History Narrative   Patient lives at home with spouse.   Caffeine Use:    Social Determinants of Radio broadcast assistant Strain: Not on file  Food Insecurity: Not on file  Transportation Needs: Not on file  Physical Activity: Not on file  Stress: Not on file  Social Connections: Not on file  Intimate Partner Violence: Not on file    FAMILY HISTORY: Family History  Problem Relation Age of  Onset  . Hypertension Mother   . Heart disease Father   . Heart attack Father   . Prostate cancer Brother   . Breast cancer Neg Hx   . Colon cancer Neg Hx   . Pancreatic cancer Neg Hx     ALLERGIES:  is allergic to amoxicillin, fosinopril, hydromorphone, ampicillin, codeine, monopril [fosinopril sodium], and percocet [oxycodone-acetaminophen].  MEDICATIONS:  Current Outpatient Medications  Medication Sig Dispense Refill  . acetaminophen (TYLENOL 8 HOUR ARTHRITIS PAIN) 650 MG CR tablet Take 1,300 mg by mouth as needed for pain (pain).     Marland Kitchen albuterol (VENTOLIN HFA) 108 (90 Base) MCG/ACT inhaler Inhale 2 puffs into the lungs every 4 (four) hours as needed for wheezing or shortness of breath (coughing fits). 8 g 2  . ALPRAZolam (XANAX) 0.5 MG tablet Take 1 tablet (0.5 mg total) by mouth as needed for anxiety (take one tablet 30 minutes prior to MRI  and may repeat once, just prior to scan if needed). 2 tablet 0  . atorvastatin (LIPITOR) 40 MG tablet TAKE 1 TABLET BY MOUTH EVERY DAY 90 tablet 2  . azelastine (ASTELIN) 0.1 % nasal spray Place 2 sprays into both nostrils 2 (two) times daily. 30 mL 5  . clindamycin (CLEOCIN) 300 MG capsule clindamycin HCl 300 mg capsule  TAKE 2 CAPSULES BY MOUTH 1 HOUR PRIOR TO DENTAL TREATMENT    . diltiazem (CARDIZEM) 30 MG tablet Take 1 tablet (30 mg total) by mouth 4 (four) times daily as needed. 60 tablet 6  . docusate sodium (COLACE) 100 MG capsule Take 100 mg by mouth 2 (two) times daily.    Marland Kitchen dofetilide (TIKOSYN) 500 MCG capsule TAKE 1 CAPSULE BY MOUTH TWICE A DAY 180 capsule 1  . fenofibrate 160 MG tablet Take 1 tablet (160 mg total) by mouth daily. Please keep upcoming appt in March 2022 with Dr. Acie Fredrickson before anymore refills. Thank you 90 tablet 0  . fluticasone (FLONASE) 50 MCG/ACT nasal spray Place 2 sprays into both nostrils daily. 18.2 mL 5  . leuprolide, 6 Month, (ELIGARD) 45 MG injection Inject 45 mg into the skin every 6 (six) months.    .  levocetirizine (XYZAL) 5 MG tablet Take 5 mg by mouth every evening.    Marland Kitchen lisinopril (ZESTRIL) 10 MG tablet TAKE 1 TABLET BY MOUTH EVERY DAY (Patient not taking: Reported on 09/22/2020) 90 tablet 1  . loperamide (IMODIUM) 2 MG capsule Take 2 mg by mouth as needed for diarrhea or loose stools.    . magnesium oxide (MAG-OX) 400 MG tablet TAKE 1 TABLET BY MOUTH EVERY DAY 90 tablet 2  . metoprolol succinate (TOPROL XL) 25 MG 24 hr tablet Take 1 tablet (25 mg total) by mouth daily. 90 tablet 3  . Multiple Vitamins-Minerals (MULTIVITAMINS THER. W/MINERALS) TABS Take 1 tablet by mouth daily.    . nitroGLYCERIN (NITROSTAT) 0.4 MG SL tablet Place 1 tablet (0.4 mg total) under the tongue every 5 (five) minutes as needed for chest pain. 25 tablet 5  . omeprazole (PRILOSEC) 10 MG capsule Take 1 capsule (10 mg total) by mouth daily. 90 capsule 3  . potassium chloride (KLOR-CON) 10 MEQ tablet Take 1 tablet (10 mEq total) by mouth daily. Please make yearly appt with Dr. Acie Fredrickson for December for future refills. 1st attempt 90 tablet 1  . Saccharomyces boulardii (PROBIOTIC) 250 MG CAPS 1 capsule    . tamsulosin (FLOMAX) 0.4 MG CAPS capsule Take 0.4 mg by mouth daily.    Marland Kitchen topiramate (TOPAMAX) 50 MG tablet Take 2 tablets (100 mg total) by mouth 2 (two) times daily. (Patient taking differently: Take 100 mg by mouth. Take 2 tablets by mouth in the AM and 1 tablet by mouth in the evening) 360 tablet 3  . traZODone (DESYREL) 50 MG tablet Take 0.5 tablets (25 mg total) by mouth at bedtime as needed for sleep. 45 tablet 3  . XARELTO 20 MG TABS tablet TAKE 1 TABLET BY MOUTH EVERY DAY 90 tablet 2   No current facility-administered medications for this visit.    REVIEW OF SYSTEMS:    10 Point review of Systems was done is negative except as noted above.  PHYSICAL EXAMINATION: ECOG PERFORMANCE STATUS: 1 - Symptomatic but completely ambulatory  . Vitals:   10/08/20 1115  BP: (!) 153/79  Pulse: 79  Resp: 18  Temp:  97.8 F (36.6 C)  SpO2: 98%   Filed Weights  10/08/20 1115  Weight: 266 lb (120.7 kg)   .Body mass index is 35.09 kg/m.  GENERAL:alert, in no acute distress and comfortable SKIN: no acute rashes, no significant lesions EYES: conjunctiva are pink and non-injected, sclera anicteric OROPHARYNX: MMM, no exudates, no oropharyngeal erythema or ulceration NECK: supple, no JVD LYMPH:  no palpable lymphadenopathy in the cervical, axillary or inguinal regions LUNGS: clear to auscultation b/l with normal respiratory effort HEART: regular rate & rhythm ABDOMEN:  normoactive bowel sounds , non tender, not distended. Extremity: trace pedal edema b/l PSYCH: alert & oriented x 3 with fluent speech NEURO: no focal motor/sensory deficits  LABORATORY DATA:  I have reviewed the data as listed  . CBC Latest Ref Rng & Units 10/08/2020 06/25/2020 09/07/2019  WBC 4.0 - 10.5 K/uL 5.0 5.0 6.9  Hemoglobin 13.0 - 17.0 g/dL 11.7(L) 12.3(L) 14.8  Hematocrit 39.0 - 52.0 % 36.1(L) 35.6(L) 44.8  Platelets 150 - 400 K/uL 240 275 265    . CMP Latest Ref Rng & Units 10/08/2020 09/11/2020 06/25/2020  Glucose 70 - 99 mg/dL 111(H) 128(H) 94  BUN 8 - 23 mg/dL 21 14 19   Creatinine 0.61 - 1.24 mg/dL 1.06 0.97 1.19  Sodium 135 - 145 mmol/L 140 139 135  Potassium 3.5 - 5.1 mmol/L 4.3 4.2 4.7  Chloride 98 - 111 mmol/L 110 110 103  CO2 22 - 32 mmol/L 22 19(L) 20  Calcium 8.9 - 10.3 mg/dL 9.8 9.1 9.7  Total Protein 6.5 - 8.1 g/dL 7.1 - -  Total Bilirubin 0.3 - 1.2 mg/dL 0.6 - -  Alkaline Phos 38 - 126 U/L 50 - -  AST 15 - 41 U/L 23 - -  ALT 0 - 44 U/L 28 - -     RADIOGRAPHIC STUDIES: I have personally reviewed the radiological images as listed and agreed with the findings in the report. CUP PACEART REMOTE DEVICE CHECK  Result Date: 09/13/2020 ILR summary report received. Battery status OK. Normal device function. No new symptom, tachy, brady, or pause episodes. No new AF episodes. Monthly summary reports and  ROV/PRN.  R. Powers, LPN, CVRS   ASSESSMENT & PLAN:   75 yo with   1) Lymphocytopenia - likely related to his radiation therapy for prostate cancer and topamax 2) Mild Anemia likely related to androgen deprivation therapy 3) Vit D deficiency - started on ergoclaciferol 50k weekly -- f/u with PCP for further mx PLAN: -Discussed patient's most recent labs from 09/23/2020, no anemia, mild lymphopenia, WBC are nml. -No evidence to suggest primary bone marrow disorder or other malignancy.  -Advised pt that radiation & medications (Topamax) are the most likely cause of low lymphocyte counts. -Advised pt that some lymphopenia may be normal after radiation therapy but does not typically translate to increased infections.  -Discussed the potential for depression following Prostate Cancer diagnosis and radiation therapy - pt denies this.  -Advised pt that there can be increased fatigue for 3-4 months following radiation. Some of his medications can also cause fatigue.  -Advised pt that there is also a chance that his last lab results were spurious, especially if they sat for awhile before testing. -Recommend pt take ergocalciferol 50k weekly. Goal Vitamin D is between 60-90 for bone health optimization.  -Recommend pt being a daily B-complex vitamin  -Recommend pt f/u with [facility] tomorrow- for his tremors/fatigue -Recommend pt f/u with Dr. Dorthy Cooler for rehab referral  -Will see back as needed   FOLLOW UP: Labs today RTC with Dr Irene Limbo  as needed based on labs   All of the patients questions were answered with apparent satisfaction. The patient knows to call the clinic with any problems, questions or concerns.  I spent 68mins counseling the patient face to face. The total time spent in the appointment was 45 minutes and more than 50% was on counseling and direct patient cares.    Sullivan Lone MD [place] AAHIVMS [facility] Hematology/Oncology [facility]  (Office):        [phone] (Work cell):  [phone] (Fax):           [phone]  10/08/2020 3:50 PM  I, Yevette Edwards, am acting as a scribe for Dr. Sullivan Lone.   .I have reviewed the above documentation for accuracy and completeness, and I agree with the above. Brunetta Genera MD

## 2020-10-09 ENCOUNTER — Encounter: Payer: Self-pay | Admitting: Neurology

## 2020-10-09 ENCOUNTER — Ambulatory Visit: Payer: Medicare PPO | Admitting: Neurology

## 2020-10-09 VITALS — BP 130/70 | HR 78 | Ht 73.5 in | Wt 266.0 lb

## 2020-10-09 DIAGNOSIS — Z9989 Dependence on other enabling machines and devices: Secondary | ICD-10-CM | POA: Diagnosis not present

## 2020-10-09 DIAGNOSIS — R251 Tremor, unspecified: Secondary | ICD-10-CM | POA: Diagnosis not present

## 2020-10-09 DIAGNOSIS — G4733 Obstructive sleep apnea (adult) (pediatric): Secondary | ICD-10-CM

## 2020-10-10 ENCOUNTER — Other Ambulatory Visit: Payer: Self-pay | Admitting: Hematology

## 2020-10-10 LAB — MULTIPLE MYELOMA PANEL, SERUM
Albumin SerPl Elph-Mcnc: 3.6 g/dL (ref 2.9–4.4)
Albumin/Glob SerPl: 1.3 (ref 0.7–1.7)
Alpha 1: 0.2 g/dL (ref 0.0–0.4)
Alpha2 Glob SerPl Elph-Mcnc: 0.8 g/dL (ref 0.4–1.0)
B-Globulin SerPl Elph-Mcnc: 1 g/dL (ref 0.7–1.3)
Gamma Glob SerPl Elph-Mcnc: 0.9 g/dL (ref 0.4–1.8)
Globulin, Total: 3 g/dL (ref 2.2–3.9)
IgA: 295 mg/dL (ref 61–437)
IgG (Immunoglobin G), Serum: 834 mg/dL (ref 603–1613)
IgM (Immunoglobulin M), Srm: 26 mg/dL (ref 15–143)
Total Protein ELP: 6.6 g/dL (ref 6.0–8.5)

## 2020-10-10 LAB — ZINC: Zinc: 84 ug/dL (ref 44–115)

## 2020-10-10 MED ORDER — ERGOCALCIFEROL 1.25 MG (50000 UT) PO CAPS: 50000.0000 [IU] | ORAL_CAPSULE | ORAL | 1 refills | Status: DC

## 2020-10-14 DIAGNOSIS — J343 Hypertrophy of nasal turbinates: Secondary | ICD-10-CM | POA: Diagnosis not present

## 2020-10-14 DIAGNOSIS — J342 Deviated nasal septum: Secondary | ICD-10-CM | POA: Diagnosis not present

## 2020-10-14 DIAGNOSIS — J310 Chronic rhinitis: Secondary | ICD-10-CM | POA: Diagnosis not present

## 2020-10-20 ENCOUNTER — Ambulatory Visit (INDEPENDENT_AMBULATORY_CARE_PROVIDER_SITE_OTHER): Payer: Medicare PPO

## 2020-10-20 DIAGNOSIS — I480 Paroxysmal atrial fibrillation: Secondary | ICD-10-CM

## 2020-10-21 LAB — CUP PACEART REMOTE DEVICE CHECK
Date Time Interrogation Session: 20220112085420
Implantable Pulse Generator Implant Date: 20210526

## 2020-10-23 ENCOUNTER — Other Ambulatory Visit: Payer: Self-pay | Admitting: Cardiovascular Disease

## 2020-10-30 ENCOUNTER — Other Ambulatory Visit: Payer: Self-pay | Admitting: Cardiovascular Disease

## 2020-11-01 NOTE — Progress Notes (Signed)
Carelink Summary Report / Loop Recorder 

## 2020-11-06 DIAGNOSIS — G4733 Obstructive sleep apnea (adult) (pediatric): Secondary | ICD-10-CM | POA: Diagnosis not present

## 2020-11-10 DIAGNOSIS — G4733 Obstructive sleep apnea (adult) (pediatric): Secondary | ICD-10-CM | POA: Diagnosis not present

## 2020-11-12 ENCOUNTER — Encounter (HOSPITAL_COMMUNITY)
Admission: RE | Admit: 2020-11-12 | Discharge: 2020-11-12 | Disposition: A | Discharge status: Home / Self Care | Payer: Medicare PPO | Source: Ambulatory Visit | Attending: Neurology | Admitting: Neurology

## 2020-11-12 ENCOUNTER — Other Ambulatory Visit: Payer: Self-pay

## 2020-11-12 ENCOUNTER — Other Ambulatory Visit (HOSPITAL_COMMUNITY): Payer: Self-pay | Admitting: Cardiovascular Disease

## 2020-11-12 DIAGNOSIS — D649 Anemia, unspecified: Secondary | ICD-10-CM | POA: Diagnosis not present

## 2020-11-12 DIAGNOSIS — R251 Tremor, unspecified: Secondary | ICD-10-CM | POA: Diagnosis not present

## 2020-11-12 DIAGNOSIS — R269 Unspecified abnormalities of gait and mobility: Secondary | ICD-10-CM | POA: Diagnosis not present

## 2020-11-12 DIAGNOSIS — G20 Parkinson's disease: Secondary | ICD-10-CM | POA: Diagnosis not present

## 2020-11-12 MED ORDER — POTASSIUM IODIDE (ANTIDOTE) 130 MG PO TABS
ORAL_TABLET | ORAL | Status: AC
  Filled 2020-11-12: qty 1

## 2020-11-12 MED ORDER — POTASSIUM IODIDE (ANTIDOTE) 130 MG PO TABS: 130.0000 mg | ORAL_TABLET | Freq: Once | ORAL | Status: DC

## 2020-11-12 MED ORDER — IOFLUPANE I 123 185 MBQ/2.5ML IV SOLN
4.8000 | Freq: Once | INTRAVENOUS | Status: AC | PRN
  Administered 2020-11-12: 4.8 via INTRAVENOUS
  Filled 2020-11-12: qty 5

## 2020-11-13 ENCOUNTER — Telehealth: Payer: Self-pay | Admitting: Neurology

## 2020-11-13 NOTE — Telephone Encounter (Signed)
Patient is scheduled for a follow up to discuss DaT scan results and next options.

## 2020-11-13 NOTE — Telephone Encounter (Signed)
Let pt know that DaT scan was described as equivocal.  Please see if he can do VV Friday to discuss next options and to discuss DaT results

## 2020-11-13 NOTE — Progress Notes (Signed)
Assessment/Plan:   1.  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  This can certainly happen with longstanding and severe essential tremor, but he really does not have this.  His DaTscan was somewhat equivocal, but demonstrated asymmetric loss of radiotracer activity in the right putamen compared to that of the left.  My biggest concern is that his identical twin presented in a very similar fashion several years ago, and has now progressed and has Parkinson's disease.  I discussed with the patient that he clinically does not yet meet the criteria for Parkinson's disease, but I am concerned that he may meet that criteria in the future.  Discussed importance of moving.    -he asks about rehab.  I think that it would help and he would benefit from it.  He is slower and slightly dragging the left leg.  Will send to [facility].  2.  OSAS  -following with Dr. Brett Fairy  3.  Leukopenia/lymphopenia  -following with hematology  Subjective:   Corey [name] was seen today in follow-up for his 11.  His DaTscan demonstrated asymmetric loss of radiotracer activity in the right putamen compared to that of the left.  This was considered to be equivocal.     ALLERGIES:   Allergies  Allergen Reactions   Amoxicillin Swelling   Fosinopril Other (See Comments)   Hydromorphone Other (See Comments) and Nausea Only   Ampicillin Rash   Codeine Nausea Only and Other (See Comments)    Heavy amounts cause nausea   Monopril [Fosinopril Sodium] Other (See Comments)    Muscle aches & pain   Percocet [Oxycodone-Acetaminophen] Nausea And Vomiting    CURRENT MEDICATIONS:  Current Outpatient Medications  Medication Instructions   albuterol (VENTOLIN HFA) 108 (90 Base) MCG/ACT inhaler 2 puffs, Inhalation, Every 4 hours PRN   ALPRAZolam (XANAX) 0.5 mg, Oral, As needed   atorvastatin (LIPITOR) 40 MG tablet TAKE 1 TABLET BY MOUTH EVERY DAY   azelastine (ASTELIN)  0.1 % nasal spray 2 sprays, Each Nare, 2 times daily   clindamycin (CLEOCIN) 300 MG capsule Take 300 mg by mouth. Uses for dental procedures   diltiazem (CARDIZEM) 30 mg, Oral, 4 times daily PRN   docusate sodium (COLACE) 100 mg, Daily   dofetilide (TIKOSYN) 500 mcg, Oral, 2 times daily, Please keep upcoming appointment in February for future refills. Thank you   ergocalciferol (VITAMIN D2) 50,000 Units, Oral, Weekly   fenofibrate 160 mg, Oral, Daily, Please keep upcoming appt in March 2022 with Dr. Acie Fredrickson before anymore refills. Thank you   fluticasone (FLONASE) 50 MCG/ACT nasal spray 2 sprays, Each Nare, Daily   leuprolide (6 Month) (ELIGARD) 45 mg, Subcutaneous, Every 6 months   levocetirizine (XYZAL) 5 mg, Oral, Every evening   lisinopril (ZESTRIL) 10 MG tablet TAKE 1 TABLET BY MOUTH EVERY DAY   loperamide (IMODIUM) 2 mg, Oral, As needed   magnesium oxide (MAG-OX) 400 MG tablet TAKE 1 TABLET BY MOUTH EVERY DAY   metoprolol succinate (TOPROL XL) 25 mg, Oral, Daily   Multiple Vitamins-Minerals (MULTIVITAMINS THER. W/MINERALS) TABS 1 tablet, Oral, Daily,     nitroGLYCERIN (NITROSTAT) 0.4 mg, Sublingual, Every 5 min PRN   omeprazole (PRILOSEC) 10 mg, Oral, Daily, Please make overdue appt with Dr. Acie Fredrickson before anymore refills. Thank you 1st attempt   potassium chloride (KLOR-CON) 10 MEQ tablet 10 mEq, Oral, Daily   Saccharomyces boulardii (PROBIOTIC) 250 MG CAPS 1 capsule, Oral, Daily  tamsulosin (FLOMAX) 0.4 mg, Oral, Daily,     topiramate (TOPAMAX) 100 mg, Oral, 2 times daily   traZODone (DESYREL) 25 mg, Oral, At bedtime PRN   Tylenol 8 Hour Arthritis Pain 1,300 mg, Oral, As needed   XARELTO 20 MG TABS tablet TAKE 1 TABLET BY MOUTH EVERY DAY    Objective:   PHYSICAL EXAMINATION:    VITALS:   Vitals:   11/14/20 1333  BP: 108/64  Pulse: 73  SpO2: 98%  Weight: 264 lb (119.7 kg)  Height: 6' 1.5" (1.867 m)    GEN:  The patient appears stated age and is in  NAD. HEENT:  Normocephalic, atraumatic.  The mucous membranes are moist.   Neurological examination:  Orientation: The patient is alert and oriented x3.  Cranial nerves: There is good facial symmetry.  Extraocular muscles are intact. The visual fields are full to confrontational testing. The speech is fluent and clear. Soft palate rises symmetrically and there is no tongue deviation. Hearing is intact to conversational tone. Sensation: Sensation is intact to light touch throughout (facial, trunk, extremities). Vibration is intact at the bilateral big toe. There is no extinction with double simultaneous stimulation.  Motor: Strength is 5/5 in the bilateral upper and lower extremities.   Shoulder shrug is equal and symmetric.  There is no pronator drift.   Movement examination: Tone: There is nl tone in the bilateral upper extremities.  The tone in the lower extremities is nl.  Abnormal movements: there is mild LUE rest tremor.  There is postural tremor, L>R.  He has some tremor with Archimedes spirals.  He has mild trouble pouring water from 1 glass to another when the water is in the left hand, but he does not spill any of the water. Coordination:  There is no decremation with RAM's, with any form of RAMS, including alternating supination and pronation of the forearm, hand opening and closing, finger taps, heel taps and toe taps. Gait and Station: The patient has some difficulty arising out of a deep-seated chair without the use of the hands. He arises after the 3rd attempt.  He is slower than last visit and is slightly dragging the L leg and his gait is antalgic.   I have reviewed and interpreted the following labs independently   Chemistry      Component Value Date/Time   NA 140 10/08/2020 1213   NA 135 06/25/2020 1336   K 4.3 10/08/2020 1213   CL 110 10/08/2020 1213   CO2 22 10/08/2020 1213   BUN 21 10/08/2020 1213   BUN 19 06/25/2020 1336   CREATININE 1.06 10/08/2020 1213   CREATININE  1.04 08/30/2016 0840      Component Value Date/Time   CALCIUM 9.8 10/08/2020 1213   ALKPHOS 50 10/08/2020 1213   AST 23 10/08/2020 1213   ALT 28 10/08/2020 1213   BILITOT 0.6 10/08/2020 1213      Lab Results  Component Value Date   TSH 2.310 07/10/2019   Lab Results  Component Value Date   WBC 5.0 10/08/2020   HGB 11.7 (L) 10/08/2020   HCT 36.1 (L) 10/08/2020   MCV 96.3 10/08/2020   PLT 240 10/08/2020    Lab Results  Component Value Date   VITAMINB12 582 09/22/2020   Lab Results  Component Value Date   IRON 113 01/05/2016   TIBC 367 01/05/2016   FERRITIN 47 01/05/2016     Total time spent on today's visit was  30 minutes, including both face-to-face  time and nonface-to-face time.  Time included that spent on review of records (prior notes available to me/labs/imaging if pertinent), discussing treatment and goals, answering patient's questions and coordinating care.  Cc:  [name], MD

## 2020-11-13 NOTE — Telephone Encounter (Signed)
Patient is scheduled for in office visit to discuss DaT scan results and next options.

## 2020-11-14 ENCOUNTER — Other Ambulatory Visit: Payer: Self-pay

## 2020-11-14 ENCOUNTER — Other Ambulatory Visit: Payer: Self-pay | Admitting: Cardiovascular Disease

## 2020-11-14 ENCOUNTER — Encounter: Payer: Self-pay | Admitting: Neurology

## 2020-11-14 ENCOUNTER — Ambulatory Visit: Payer: Medicare PPO | Admitting: Neurology

## 2020-11-14 VITALS — BP 108/64 | HR 73 | Ht 73.5 in | Wt 264.0 lb

## 2020-11-14 DIAGNOSIS — R251 Tremor, unspecified: Secondary | ICD-10-CM | POA: Diagnosis not present

## 2020-11-14 NOTE — Addendum Note (Signed)
Addended by: Elspeth Cho R on: 11/14/2020 02:17 PM   Modules accepted: Orders

## 2020-11-18 ENCOUNTER — Ambulatory Visit: Payer: Medicare PPO | Admitting: Nurse Practitioner

## 2020-11-19 LAB — CUP PACEART REMOTE DEVICE CHECK
Date Time Interrogation Session: 20220214085958
Implantable Pulse Generator Implant Date: 20210526

## 2020-11-19 NOTE — Progress Notes (Signed)
Follow Up Note  RE: RAKIN LEMELLE MRN: [id number] DOB: 08-20-1946 Date of Office Visit: 11/20/2020  Referring provider: [name], MD Primary care provider: [name], MD  Chief Complaint: Allergic Rhinitis  (Coughing and drainage)  History of Present Illness: I had the pleasure of seeing Corey [name] for a follow up visit at the [facility] on 11/20/2020. He is a 75 y.o. male, who is being followed for allergic rhinitis and coughing. His previous allergy office visit was on 09/18/2020 with Dr. Maudie Mercury. Today is a regular follow up visit.  Allergic rhinitis with non-allergic component Currently using Flonase 2 sprays per nostril, azelastine 2 sprays per nostril BID and noticing that he has symptoms with drainage around 3PM. Restarted Xyzal in the afternoon with unknown benefit.  Saw ENT as well - no surgery for now.   Still having some issues with fatigue but thinks it's due to physical deconditioning. Twin brother was diagnosed with Parkinson's disease and patient has been concerned about this. Patient is thinking of enrolling in a physical program recommended by his neurologist.   Coughing/wheezing. Taking albuterol daily in the morning and in the evening due to wheezing with good benefit.   Assessment and Plan: Ayrton is a 75 y.o. male with: Allergic rhinitis with non-allergic component Past history - Perennial rhinoconjunctivitis symptoms for 20+ years with worsening in the spring and fall.  Other triggers include change in temperature.  Tried Flonase, ipratropium and Xyzal with some benefit.  Recent ENT evaluation showed deviated septum and dry nasal passages.  Patient has obstructive sleep apnea and wears a CPAP machine at night. 2021 skin testing showed: Positive to tree pollen only. Interim history - Some improvement but still has significant drainage around 3PM. No ENT surgery for now.  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   Continue environmental  control measures as below for the tree pollen which blooms in the spring.  May use Flonase (fluticasone) nasal spray 2 sprays per nostril in the morning for nasal congestion. May use azelastine nasal spray 2 sprays per nostril in the morning and 2 sprays around 3PM.  Nasal saline spray (i.e., Simply Saline) or nasal saline lavage (i.e., NeilMed) is recommended as needed and prior to medicated nasal sprays.  Only use levocetirizine (Xyzal) 5mg  once a day if needed.   Reactive airway disease Past history - Episodes of coughing from postnasal drip, wheezing, shortness of breath and chest tightness for 20+ years.  No prior inhaler use.  Recent chest x-ray was unremarkable.  Patient also has significant cardiac history with paroxysmal atrial fibrillation, coronary artery disease and hypertension.  No longer on lisinopril. 2021 spirometry was normal with no improvement in FEV1 post bronchodilator treatment.  Clinically feeling improved. Interim history - using albuterol twice a day for wheezing and noted benefit. Today's spirometry showed some restriction most likely due to body habitus.  Daily controller medication(s): start Alvesco 33mcg 2 puffs twice a day with spacer and rinse mouth afterwards. Samples given.   This is a prodrug with minimal side effects.  If you notice improvement in wheezing then let me know and I will send in a prescription. May use albuterol rescue inhaler 2 puffs every 4 to 6 hours as needed for shortness of breath, chest tightness, coughing, and wheezing. May use albuterol rescue inhaler 2 puffs 5 to 15 minutes prior to strenuous physical activities. Monitor frequency of use. r Repeat spirometry at next visit. . Continue with omeprazole 10mg  daily as  before - see below for heartburn lifestyle.   Return in about 4 months (around 03/20/2021).  Diagnostics: Spirometry:  Tracings reviewed. His effort: Good reproducible efforts. FVC: 3.25L FEV1: 2.48L, 71% predicted FEV1/FVC  ratio: 76% Interpretation: Spirometry consistent with possible restrictive disease.  Please see scanned spirometry results for details.  Medication List:  Current Outpatient Medications  Medication Sig Dispense Refill  . acetaminophen (TYLENOL 8 HOUR ARTHRITIS PAIN) 650 MG CR tablet Take 1,300 mg by mouth as needed for pain (pain).     Marland Kitchen albuterol (VENTOLIN HFA) 108 (90 Base) MCG/ACT inhaler Inhale 2 puffs into the lungs every 4 (four) hours as needed for wheezing or shortness of breath (coughing fits). 8 g 2  . ALPRAZolam (XANAX) 0.5 MG tablet Take 1 tablet (0.5 mg total) by mouth as needed for anxiety (take one tablet 30 minutes prior to MRI and may repeat once, just prior to scan if needed). 2 tablet 0  . atorvastatin (LIPITOR) 40 MG tablet TAKE 1 TABLET BY MOUTH EVERY DAY 90 tablet 2  . azelastine (ASTELIN) 0.1 % nasal spray Place 2 sprays into both nostrils 2 (two) times daily. 30 mL 5  . clindamycin (CLEOCIN) 300 MG capsule Take 300 mg by mouth. Uses for dental procedures    . diltiazem (CARDIZEM) 30 MG tablet Take 1 tablet (30 mg total) by mouth 4 (four) times daily as needed. 60 tablet 6  . dofetilide (TIKOSYN) 500 MCG capsule Take 1 capsule (500 mcg total) by mouth 2 (two) times daily. Please keep upcoming appointment in February for future refills. Thank you 180 capsule 0  . ergocalciferol (VITAMIN D2) 1.25 MG (50000 UT) capsule Take 1 capsule (50,000 Units total) by mouth once a week. 12 capsule 1  . fenofibrate 160 MG tablet Take 1 tablet (160 mg total) by mouth daily. Please keep upcoming appt in March 2022 with Dr. Acie Fredrickson before anymore refills. Thank you Final Attempt 30 tablet 1  . fluticasone (FLONASE) 50 MCG/ACT nasal spray Place 2 sprays into both nostrils daily. 18.2 mL 5  . leuprolide, 6 Month, (ELIGARD) 45 MG injection Inject 45 mg into the skin every 6 (six) months.    . levocetirizine (XYZAL) 5 MG tablet Take 5 mg by mouth every evening.    . loperamide (IMODIUM) 2 MG  capsule Take 2 mg by mouth as needed for diarrhea or loose stools.    . magnesium oxide (MAG-OX) 400 MG tablet TAKE 1 TABLET BY MOUTH EVERY DAY 90 tablet 2  . metoprolol succinate (TOPROL XL) 25 MG 24 hr tablet Take 1 tablet (25 mg total) by mouth daily. 90 tablet 3  . Multiple Vitamins-Minerals (MULTIVITAMINS THER. W/MINERALS) TABS Take 1 tablet by mouth daily.    . nitroGLYCERIN (NITROSTAT) 0.4 MG SL tablet Place 1 tablet (0.4 mg total) under the tongue every 5 (five) minutes as needed for chest pain. 25 tablet 5  . omeprazole (PRILOSEC) 10 MG capsule Take 1 capsule (10 mg total) by mouth daily. Please keep upcoming appt in March 2022 with Dr. Acie Fredrickson before anymore refills. Thank you Final Attempt 30 capsule 1  . potassium chloride (KLOR-CON) 10 MEQ tablet Take 1 tablet (10 mEq total) by mouth daily. 90 tablet 2  . Saccharomyces boulardii (PROBIOTIC) 250 MG CAPS Take 1 capsule by mouth daily.    . tamsulosin (FLOMAX) 0.4 MG CAPS capsule Take 0.4 mg by mouth daily.    Marland Kitchen topiramate (TOPAMAX) 50 MG tablet Take 2 tablets (100 mg total) by mouth  2 (two) times daily. (Patient taking differently: Take 100 mg by mouth. Take 1 tablets by mouth in the AM and 2 tablet by mouth in the evening) 360 tablet 3  . traZODone (DESYREL) 50 MG tablet Take 0.5 tablets (25 mg total) by mouth at bedtime as needed for sleep. 45 tablet 3  . XARELTO 20 MG TABS tablet TAKE 1 TABLET BY MOUTH EVERY DAY 90 tablet 2   No current facility-administered medications for this visit.   Allergies: Allergies  Allergen Reactions  . Amoxicillin Swelling  . Fosinopril Other (See Comments)  . Hydromorphone Other (See Comments) and Nausea Only  . Ampicillin Rash  . Codeine Nausea Only and Other (See Comments)    Heavy amounts cause nausea  . Monopril [Fosinopril Sodium] Other (See Comments)    Muscle aches & pain  . Percocet [Oxycodone-Acetaminophen] Nausea And Vomiting   I reviewed his past medical history, social history, family  history, and environmental history and no significant changes have been reported from his previous visit.  Review of Systems  Constitutional: Negative for appetite change, chills, fever and unexpected weight change.  HENT: Positive for congestion, postnasal drip and rhinorrhea.   Respiratory: Positive for cough and wheezing. Negative for chest tightness and shortness of breath.   Cardiovascular: Negative for chest pain.  Gastrointestinal: Negative for abdominal pain.  Genitourinary: Negative for difficulty urinating.  Skin: Negative for rash.  Allergic/Immunologic: Positive for environmental allergies.  Neurological: Negative for headaches.   Objective: BP 118/64 (BP Location: Right Arm, Patient Position: Sitting, Cuff Size: Large)   Pulse 75   Temp (!) 96.5 F (35.8 C) (Temporal)   Resp 17   Ht 6' 1.31" (1.862 m)   Wt 266 lb (120.7 kg)   SpO2 96%   BMI 34.80 kg/m  Body mass index is 34.8 kg/m. Physical Exam Vitals and nursing note reviewed.  Constitutional:      Appearance: Normal appearance. He is well-developed.  HENT:     Head: Normocephalic and atraumatic.     Right Ear: External ear normal.     Left Ear: External ear normal.     Nose: Nose normal.     Mouth/Throat:     Mouth: Mucous membranes are moist.     Pharynx: Oropharynx is clear.  Eyes:     Conjunctiva/sclera: Conjunctivae normal.  Cardiovascular:     Rate and Rhythm: Normal rate and regular rhythm.     Heart sounds: Normal heart sounds. No murmur heard. No friction rub. No gallop.   Pulmonary:     Effort: Pulmonary effort is normal.     Breath sounds: Normal breath sounds. No wheezing, rhonchi or rales.  Abdominal:     Palpations: Abdomen is soft.  Musculoskeletal:     Cervical back: Neck supple.  Skin:    General: Skin is warm.     Findings: No rash.  Neurological:     Mental Status: He is alert and oriented to person, place, and time.  Psychiatric:        Behavior: Behavior normal.     Previous notes and tests were reviewed. The plan was reviewed with the patient/family, and all questions/concerned were addressed.  It was my pleasure to see Corey [name] today and participate in his care. Please feel free to contact me with any questions or concerns.  Sincerely,  Rexene Alberts, DO Allergy & Immunology  [facility] office: [place] office: [phone]

## 2020-11-20 ENCOUNTER — Encounter: Payer: Self-pay | Admitting: Allergy

## 2020-11-20 ENCOUNTER — Other Ambulatory Visit: Payer: Self-pay

## 2020-11-20 ENCOUNTER — Ambulatory Visit: Payer: Medicare PPO | Admitting: Allergy

## 2020-11-20 VITALS — BP 118/64 | HR 75 | Temp 96.5°F | Resp 17 | Ht 73.31 in | Wt 266.0 lb

## 2020-11-20 DIAGNOSIS — J4520 Mild intermittent asthma, uncomplicated: Secondary | ICD-10-CM

## 2020-11-20 DIAGNOSIS — R059 Cough, unspecified: Secondary | ICD-10-CM

## 2020-11-20 DIAGNOSIS — J3089 Other allergic rhinitis: Secondary | ICD-10-CM

## 2020-11-20 DIAGNOSIS — J45909 Unspecified asthma, uncomplicated: Secondary | ICD-10-CM | POA: Insufficient documentation

## 2020-11-20 NOTE — Assessment & Plan Note (Signed)
Past history - Perennial rhinoconjunctivitis symptoms for 20+ years with worsening in the spring and fall.  Other triggers include change in temperature.  Tried Flonase, ipratropium and Xyzal with some benefit.  Recent ENT evaluation showed deviated septum and dry nasal passages.  Patient has obstructive sleep apnea and wears a CPAP machine at night. 2021 skin testing showed: Positive to tree pollen only. Interim history - Some improvement but still has significant drainage around 3PM. No ENT surgery for now.  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   Continue environmental control measures as below for the tree pollen which blooms in the spring.  May use Flonase (fluticasone) nasal spray 2 sprays per nostril in the morning for nasal congestion. May use azelastine nasal spray 2 sprays per nostril in the morning and 2 sprays around 3PM.  Nasal saline spray (i.e., Simply Saline) or nasal saline lavage (i.e., NeilMed) is recommended as needed and prior to medicated nasal sprays.  Only use levocetirizine (Xyzal) 5mg  once a day if needed.

## 2020-11-20 NOTE — Assessment & Plan Note (Signed)
Past history - Episodes of coughing from postnasal drip, wheezing, shortness of breath and chest tightness for 20+ years.  No prior inhaler use.  Recent chest x-ray was unremarkable.  Patient also has significant cardiac history with paroxysmal atrial fibrillation, coronary artery disease and hypertension.  No longer on lisinopril. 2021 spirometry was normal with no improvement in FEV1 post bronchodilator treatment.  Clinically feeling improved. Interim history - using albuterol twice a day for wheezing and noted benefit. Today's spirometry showed some restriction most likely due to body habitus.  Daily controller medication(s): start Alvesco 16mcg 2 puffs twice a day with spacer and rinse mouth afterwards. Samples given.   This is a prodrug with minimal side effects.  If you notice improvement in wheezing then let me know and I will send in a prescription. May use albuterol rescue inhaler 2 puffs every 4 to 6 hours as needed for shortness of breath, chest tightness, coughing, and wheezing. May use albuterol rescue inhaler 2 puffs 5 to 15 minutes prior to strenuous physical activities. Monitor frequency of use. r Repeat spirometry at next visit. . Continue with omeprazole 10mg  daily as before - see below for heartburn lifestyle.

## 2020-11-20 NOTE — Patient Instructions (Addendum)
Rhinitis:  2021 skin testing showed positive to tree pollen.   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   Continue environmental control measures as below for the tree pollen which blooms in the spring.  May use Flonase (fluticasone) nasal spray 2 sprays per nostril in the morning for nasal congestion. May use azelastine nasal spray 2 sprays per nostril in the morning and 2 sprays around 3PM.  Nasal saline spray (i.e., Simply Saline) or nasal saline lavage (i.e., NeilMed) is recommended as needed and prior to medicated nasal sprays.  Only use levocetirizine (xyzal) 5mg  once a day if needed.   Breathing:  Daily controller medication(s): start Alvesco 40mcg 2 puffs twice a day with spacer and rinse mouth afterwards. Samples given. If you notice improvement in wheezing then let me know and I will send in a prescription. May use albuterol rescue inhaler 2 puffs every 4 to 6 hours as needed for shortness of breath, chest tightness, coughing, and wheezing. May use albuterol rescue inhaler 2 puffs 5 to 15 minutes prior to strenuous physical activities. Monitor frequency of use.  Asthma control goals:  Full participation in all desired activities (may need albuterol before activity) Albuterol use two times or less a week on average (not counting use with activity) Cough interfering with sleep two times or less a month Oral steroids no more than once a year No hospitalizations  . Continue with omeprazole 10mg  daily as before - see below for heartburn lifestyle.   Follow up in 4 months or sooner if needed.  Keep up with the increased physical activity.   Reducing Pollen Exposure . Pollen seasons: trees (spring), grass (summer) and ragweed/weeds (fall). Marland Kitchen Keep windows closed in your home and car to lower pollen exposure.  Susa Simmonds air conditioning in the bedroom and throughout the house if possible.  . Avoid going out in dry windy days - especially early morning. . Pollen counts are  highest between 5 - 10 AM and on dry, hot and windy days.  . Save outside activities for late afternoon or after a heavy rain, when pollen levels are lower.  . Avoid mowing of grass if you have grass pollen allergy. Marland Kitchen Be aware that pollen can also be transported indoors on people and pets.  . Dry your clothes in an automatic dryer rather than hanging them outside where they might collect pollen.  . Rinse hair and eyes before bedtime.    Heartburn Heartburn is a type of pain or discomfort that can happen in the throat or chest. It is often described as a burning pain. It may also cause a bad, acid-like taste in the mouth. Heartburn may feel worse when you lie down or bend over. It may be worse at night. It may be caused by stomach contents that move back up (reflux) into the tube that connects the mouth with the stomach (esophagus). Follow these instructions at home: Eating and drinking   Avoid certain foods and drinks as told by your doctor. This may include: ? Coffee and tea (with or without caffeine). ? Drinks that have alcohol. ? Energy drinks and sports drinks. ? Carbonated drinks or sodas. ? Chocolate and cocoa. ? Peppermint and mint flavorings. ? Garlic and onions. ? Horseradish. ? Spicy and acidic foods, such as:  Peppers.  Chili powder and curry powder.  Vinegar.  Hot sauces and BBQ sauce. ? Citrus fruit juices and citrus fruits, such as:  Oranges.  Lemons.  Limes. ? Tomato-based foods, such as:  Red sauce and pizza with red sauce.  Chili.  Salsa. ? Fried and fatty foods, such as:  Donuts.  [place] fries and potato chips.  High-fat dressings. ? High-fat meats, such as:  Hot dogs and sausage.  Rib eye steak.  Ham and bacon. ? High-fat dairy items, such as:  Whole milk.  Butter.  Cream cheese.  Eat small meals often. Avoid eating large meals.  Avoid drinking large amounts of liquid with your meals.  Avoid eating meals during the 2-3 hours  before bedtime.  Avoid lying down right after you eat.  Do not exercise right after you eat. Lifestyle      If you are overweight, lose an amount of weight that is healthy for you. Ask your doctor about a safe weight loss goal.  Do not use any products that contain nicotine or tobacco, including cigarettes, e-cigarettes, and chewing tobacco. These can make your symptoms worse. If you need help quitting, ask your doctor.  Wear loose clothes. Do not wear anything tight around your waist.  Raise (elevate) the head of your bed about 6 inches (15 cm) when you sleep.  Try to lower your stress. If you need help doing this, ask your doctor. General instructions  Pay attention to any changes in your symptoms.  Take over-the-counter and prescription medicines only as told by your doctor. ? Do not take aspirin, ibuprofen, or other NSAIDs unless your doctor says it is okay. ? Stop medicines only as told by your doctor.  Keep all follow-up visits as told by your doctor. This is important. Contact a doctor if:  You have new symptoms.  You lose weight and you do not know why it is happening.  You have trouble swallowing, or it hurts to swallow.  You have wheezing or a cough that keeps happening.  Your symptoms do not get better with treatment.  You have heartburn often for more than 2 weeks. Get help right away if:  You have pain in your arms, neck, jaw, teeth, or back.  You feel sweaty, dizzy, or light-headed.  You have chest pain or shortness of breath.  You throw up (vomit) and your throw up looks like blood or coffee grounds.  Your poop (stool) is bloody or black. These symptoms may represent a serious problem that is an emergency. Do not wait to see if the symptoms will go away. Get medical help right away. Call your local emergency services (911 in the [place] Do not drive yourself to the hospital. Summary  Heartburn is a type of pain that can happen in the throat or  chest. It can feel like a burning pain. It may also cause a bad, acid-like taste in the mouth.  You may need to avoid certain foods and drinks to help your symptoms. Ask your doctor what foods and drinks you should avoid.  Take over-the-counter and prescription medicines only as told by your doctor. Do not take aspirin, ibuprofen, or other NSAIDs unless your doctor told you to do so.  Contact your doctor if your symptoms do not get better or they get worse. This information is not intended to replace advice given to you by your health care provider. Make sure you discuss any questions you have with your health care provider. Document Revised: 02/20/2018 Document Reviewed: 02/20/2018 Elsevier Patient Education  [place].

## 2020-11-21 DIAGNOSIS — R194 Change in bowel habit: Secondary | ICD-10-CM | POA: Diagnosis not present

## 2020-11-24 ENCOUNTER — Ambulatory Visit (INDEPENDENT_AMBULATORY_CARE_PROVIDER_SITE_OTHER): Payer: Medicare PPO

## 2020-11-24 DIAGNOSIS — R55 Syncope and collapse: Secondary | ICD-10-CM | POA: Diagnosis not present

## 2020-11-28 ENCOUNTER — Other Ambulatory Visit: Payer: Self-pay

## 2020-11-28 DIAGNOSIS — R251 Tremor, unspecified: Secondary | ICD-10-CM

## 2020-11-28 NOTE — Progress Notes (Signed)
Carelink Summary Report / Loop Recorder 

## 2020-12-02 DIAGNOSIS — R2689 Other abnormalities of gait and mobility: Secondary | ICD-10-CM | POA: Diagnosis not present

## 2020-12-02 DIAGNOSIS — R5383 Other fatigue: Secondary | ICD-10-CM | POA: Diagnosis not present

## 2020-12-02 DIAGNOSIS — R2681 Unsteadiness on feet: Secondary | ICD-10-CM | POA: Diagnosis not present

## 2020-12-02 DIAGNOSIS — R531 Weakness: Secondary | ICD-10-CM | POA: Diagnosis not present

## 2020-12-04 DIAGNOSIS — R5383 Other fatigue: Secondary | ICD-10-CM | POA: Diagnosis not present

## 2020-12-04 DIAGNOSIS — R2681 Unsteadiness on feet: Secondary | ICD-10-CM | POA: Diagnosis not present

## 2020-12-04 DIAGNOSIS — R531 Weakness: Secondary | ICD-10-CM | POA: Diagnosis not present

## 2020-12-04 DIAGNOSIS — R2689 Other abnormalities of gait and mobility: Secondary | ICD-10-CM | POA: Diagnosis not present

## 2020-12-04 DIAGNOSIS — G4733 Obstructive sleep apnea (adult) (pediatric): Secondary | ICD-10-CM | POA: Diagnosis not present

## 2020-12-07 ENCOUNTER — Encounter: Payer: Self-pay | Admitting: Allergy

## 2020-12-08 MED ORDER — ALVESCO 80 MCG/ACT IN AERS: 2.0000 | INHALATION_SPRAY | Freq: Two times a day (BID) | RESPIRATORY_TRACT | 5 refills | Status: DC

## 2020-12-09 DIAGNOSIS — R5383 Other fatigue: Secondary | ICD-10-CM | POA: Diagnosis not present

## 2020-12-09 DIAGNOSIS — R531 Weakness: Secondary | ICD-10-CM | POA: Diagnosis not present

## 2020-12-09 DIAGNOSIS — R2689 Other abnormalities of gait and mobility: Secondary | ICD-10-CM | POA: Diagnosis not present

## 2020-12-09 DIAGNOSIS — R2681 Unsteadiness on feet: Secondary | ICD-10-CM | POA: Diagnosis not present

## 2020-12-11 DIAGNOSIS — R531 Weakness: Secondary | ICD-10-CM | POA: Diagnosis not present

## 2020-12-11 DIAGNOSIS — R2681 Unsteadiness on feet: Secondary | ICD-10-CM | POA: Diagnosis not present

## 2020-12-11 DIAGNOSIS — R5383 Other fatigue: Secondary | ICD-10-CM | POA: Diagnosis not present

## 2020-12-11 DIAGNOSIS — R2689 Other abnormalities of gait and mobility: Secondary | ICD-10-CM | POA: Diagnosis not present

## 2020-12-12 MED ORDER — FLUTICASONE PROPIONATE HFA 110 MCG/ACT IN AERO: 2.0000 | INHALATION_SPRAY | Freq: Two times a day (BID) | RESPIRATORY_TRACT | 5 refills | Status: DC

## 2020-12-12 NOTE — Addendum Note (Signed)
Addended by: Garnet Sierras on: 12/12/2020 04:36 PM   Modules accepted: Orders

## 2020-12-16 DIAGNOSIS — R2689 Other abnormalities of gait and mobility: Secondary | ICD-10-CM | POA: Diagnosis not present

## 2020-12-16 DIAGNOSIS — R5383 Other fatigue: Secondary | ICD-10-CM | POA: Diagnosis not present

## 2020-12-16 DIAGNOSIS — R531 Weakness: Secondary | ICD-10-CM | POA: Diagnosis not present

## 2020-12-16 DIAGNOSIS — R2681 Unsteadiness on feet: Secondary | ICD-10-CM | POA: Diagnosis not present

## 2020-12-18 DIAGNOSIS — M546 Pain in thoracic spine: Secondary | ICD-10-CM | POA: Diagnosis not present

## 2020-12-18 DIAGNOSIS — G4733 Obstructive sleep apnea (adult) (pediatric): Secondary | ICD-10-CM | POA: Diagnosis not present

## 2020-12-21 ENCOUNTER — Other Ambulatory Visit: Payer: Self-pay | Admitting: Cardiovascular Disease

## 2020-12-22 DIAGNOSIS — E7800 Pure hypercholesterolemia, unspecified: Secondary | ICD-10-CM | POA: Diagnosis not present

## 2020-12-22 DIAGNOSIS — D72810 Lymphocytopenia: Secondary | ICD-10-CM | POA: Diagnosis not present

## 2020-12-22 DIAGNOSIS — R7309 Other abnormal glucose: Secondary | ICD-10-CM | POA: Diagnosis not present

## 2020-12-22 DIAGNOSIS — Z79899 Other long term (current) drug therapy: Secondary | ICD-10-CM | POA: Diagnosis not present

## 2020-12-22 DIAGNOSIS — I480 Paroxysmal atrial fibrillation: Secondary | ICD-10-CM | POA: Diagnosis not present

## 2020-12-22 DIAGNOSIS — D6869 Other thrombophilia: Secondary | ICD-10-CM | POA: Diagnosis not present

## 2020-12-22 DIAGNOSIS — I2510 Atherosclerotic heart disease of native coronary artery without angina pectoris: Secondary | ICD-10-CM | POA: Diagnosis not present

## 2020-12-22 DIAGNOSIS — C61 Malignant neoplasm of prostate: Secondary | ICD-10-CM | POA: Diagnosis not present

## 2020-12-22 DIAGNOSIS — E559 Vitamin D deficiency, unspecified: Secondary | ICD-10-CM | POA: Diagnosis not present

## 2020-12-23 DIAGNOSIS — R531 Weakness: Secondary | ICD-10-CM | POA: Diagnosis not present

## 2020-12-23 DIAGNOSIS — R2681 Unsteadiness on feet: Secondary | ICD-10-CM | POA: Diagnosis not present

## 2020-12-23 DIAGNOSIS — R5383 Other fatigue: Secondary | ICD-10-CM | POA: Diagnosis not present

## 2020-12-23 DIAGNOSIS — R2689 Other abnormalities of gait and mobility: Secondary | ICD-10-CM | POA: Diagnosis not present

## 2020-12-25 DIAGNOSIS — R531 Weakness: Secondary | ICD-10-CM | POA: Diagnosis not present

## 2020-12-25 DIAGNOSIS — R2681 Unsteadiness on feet: Secondary | ICD-10-CM | POA: Diagnosis not present

## 2020-12-25 DIAGNOSIS — R5383 Other fatigue: Secondary | ICD-10-CM | POA: Diagnosis not present

## 2020-12-25 DIAGNOSIS — R2689 Other abnormalities of gait and mobility: Secondary | ICD-10-CM | POA: Diagnosis not present

## 2020-12-26 ENCOUNTER — Ambulatory Visit: Payer: Medicare PPO | Admitting: Cardiovascular Disease

## 2020-12-27 LAB — CUP PACEART REMOTE DEVICE CHECK
Date Time Interrogation Session: 20220319100011
Implantable Pulse Generator Implant Date: 20210526

## 2020-12-29 ENCOUNTER — Ambulatory Visit (INDEPENDENT_AMBULATORY_CARE_PROVIDER_SITE_OTHER): Payer: Medicare PPO

## 2020-12-29 DIAGNOSIS — R55 Syncope and collapse: Secondary | ICD-10-CM | POA: Diagnosis not present

## 2021-01-04 DIAGNOSIS — G4733 Obstructive sleep apnea (adult) (pediatric): Secondary | ICD-10-CM | POA: Diagnosis not present

## 2021-01-06 DIAGNOSIS — R5383 Other fatigue: Secondary | ICD-10-CM | POA: Diagnosis not present

## 2021-01-06 DIAGNOSIS — R531 Weakness: Secondary | ICD-10-CM | POA: Diagnosis not present

## 2021-01-06 DIAGNOSIS — R2689 Other abnormalities of gait and mobility: Secondary | ICD-10-CM | POA: Diagnosis not present

## 2021-01-06 DIAGNOSIS — R2681 Unsteadiness on feet: Secondary | ICD-10-CM | POA: Diagnosis not present

## 2021-01-08 ENCOUNTER — Other Ambulatory Visit: Payer: Self-pay | Admitting: Allergy

## 2021-01-08 DIAGNOSIS — R2689 Other abnormalities of gait and mobility: Secondary | ICD-10-CM | POA: Diagnosis not present

## 2021-01-08 DIAGNOSIS — R531 Weakness: Secondary | ICD-10-CM | POA: Diagnosis not present

## 2021-01-08 DIAGNOSIS — R5383 Other fatigue: Secondary | ICD-10-CM | POA: Diagnosis not present

## 2021-01-08 DIAGNOSIS — R2681 Unsteadiness on feet: Secondary | ICD-10-CM | POA: Diagnosis not present

## 2021-01-08 NOTE — Progress Notes (Signed)
Carelink Summary Report / Loop Recorder 

## 2021-01-12 DIAGNOSIS — R194 Change in bowel habit: Secondary | ICD-10-CM | POA: Diagnosis not present

## 2021-01-13 DIAGNOSIS — R5383 Other fatigue: Secondary | ICD-10-CM | POA: Diagnosis not present

## 2021-01-13 DIAGNOSIS — R531 Weakness: Secondary | ICD-10-CM | POA: Diagnosis not present

## 2021-01-13 DIAGNOSIS — R2689 Other abnormalities of gait and mobility: Secondary | ICD-10-CM | POA: Diagnosis not present

## 2021-01-13 DIAGNOSIS — R2681 Unsteadiness on feet: Secondary | ICD-10-CM | POA: Diagnosis not present

## 2021-01-15 DIAGNOSIS — R5383 Other fatigue: Secondary | ICD-10-CM | POA: Diagnosis not present

## 2021-01-15 DIAGNOSIS — R2681 Unsteadiness on feet: Secondary | ICD-10-CM | POA: Diagnosis not present

## 2021-01-15 DIAGNOSIS — R2689 Other abnormalities of gait and mobility: Secondary | ICD-10-CM | POA: Diagnosis not present

## 2021-01-15 DIAGNOSIS — R531 Weakness: Secondary | ICD-10-CM | POA: Diagnosis not present

## 2021-01-20 ENCOUNTER — Other Ambulatory Visit: Payer: Self-pay | Admitting: Cardiovascular Disease

## 2021-01-20 NOTE — Progress Notes (Signed)
Cardiology Office Note   Date:  01/21/2021   ID:  Corey [name], DOB 08-25-1946, MRN [id number]  PCP:  [name], MD  Cardiologist:   Mertie Moores, MD   Chief Complaint  Patient presents with  . Coronary Artery Disease  . Atrial Fibrillation   Problem List: 1.  . Coronary artery disease-status post PTCA and stenting of his right coronary artery 2. Hypertension 3. Hyperlipidemia 4. 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 Atrial fibrillation - discovered after his stroke    Corey [name] is a 75 yo gentleman with a history of coronary artery disease. Status post PTCA and stenting of his right coronary artery. He also has a history of hypertension, and hyperlipidemia. 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  He had a stroke in Oct. He had some speech problems and loss of fine motor control of the right arm. TEE was negative for PFO or LAA thrombus. Event monitor showed no A-Fib. He has been on Plavix since his stents in  January 05, 2013:  Corey [name] had an episode of atrial fib in January. He was started on Xarelto and has done well.  He has had some fatigue on occasion. He monitors his BP.   He has recovered from his stroke.   Feb. 13, 2015:  Corey [name] is doing ok. Still having touble losing weight. Has had a URI for the past month or so. No CP, no palps. Has recovered well from his CVA. He is looking forward to being able to exercise more   December 23, 2014: Corey [name] is a 75 y.o. male who presents following recent hospitalization for rapid atrial fib. He presented to the hospital with syncope /presyncope.  He was found to have rapid atrial fibrillation.  Was started on IV diltiazem and converted by the next am. He has maintained NSR. Has gained some weight due to inactivity.    07/10/2015:  Corey [name] is doing well.   No CP , no dyspnea.  No HR irregularities.   Has had rapid atrial fib in March , 2016 . Is no Xarelto   January 05, 2016:  Corey [name] is  seen back for CAD and HTN.and PAF Also has anemia and has been found to have low iron.  Has had a colonoscopy ~ 2 years ago.   Negative stool guiac.   Nov. 27, 2017: Corey [name] was admitted to the hospital in Sept. 2017 with diverticulitis. Doing well.   No CP or dyspnea.  Due for his carotid duplex - has mild Carotid artery disease.   September 20, 2017:   Corey [name] is seen today for follow-up visit. His twin brother was diagnosed with parkinsons recently . hes worried about that.   Going to see a neurologist.  Gets fatigued on occasion.    No angina .   Exercising some.   Has some back issues.  Is trying to lose some weight .  Occasionally has some left leg swelling   Dec. 18, 2019 Corey [name] was seen today for follow-up of his coronary artery disease, PAF,  hypertension, hyperlipidemia. Has had some orthostasis  Has lost some weight .  Wt = 270 lbs today  Exercising more,   Needs left hand surgery in January .  Needs cardiac clearance  Is on Xarelto for PAF  - will hold for 2 days prior to hand surgery    Has stable carotid artery disease  Repeat carotid duplex is ordered for  next year    July 10, 2019:  Corey [name] is seen today for follow-up of his coronary artery disease, paroxysmal atrial fibrillation, hypertension, hyperlipidemia  He was seen for telemedicine visit about a month ago.   He started him on HCTZ and potassium at that time.    He was seen in the emergency room several days ago with episodes of palpitations and was found to have atrial fibrillation.  Was getting prepped for cardioversion but then he converted on its own.   This is 2nd episode of AF.  Uses CPAP.  No ETOH.   Encouraged weight loss       Dec. 8, 2020:  Had an episode of PAF this past weekend.   Took a cardizem and the AF resolved.  Took a Diltiazem.   Waited another hour.  Call our on call .     went to [place].   Received IV Dilt.  Slowed the AF ,  Then he had a DC cardioversion  Went to the AF clinic  yesterday  The plan is to go with Tikosyn.  (admission is schedlued) Going in for admission on Tuesday ,  Had some chest pain when his HR is fast.   January 21, 2021: Corey [name] is seen for follow up of his CAD, atrial fib  Has prostate cancer and is having lots of problems with the treatment plan  He has been seen in Afib clinic  He has been started on tikosyn in Dec. 2021 His implantable loop shows 0% Afib burden since then    Has lots of fatigue,  Is on Eligard ( blocks testosterone )  Apparently interacts with tikosyn In looking at [employer], there is an interactio - listing risk of QT prolongation , addrogen deprivation  Has not contacted our EP  - has seen Dr. Rayann Heman in the past  Will be off eligard on June 10 ( but it will be in his system for 6 months )    Past Medical History:  Diagnosis Date  . Arthritis    "back, right knee, hands, ankles, neck" (05/31/2016)  . Carotid artery disease ([place]) 08/30/2016   Carotid US 11/19: R 1-39, L 40-59 // Carotid US 08/2019: R 1-39; L 40-59 // Carotid US 11/21: R 1-39; L 40-59>> repeat 1 year   . Chronic lower back pain   . Coronary atherosclerosis of native coronary artery    a. BMS to [facility] 2004 and 2007, otherwise mild nonobstructive disease. EF normal.  . Diverticulitis   . Dyslipidemia   . Essential hypertension, benign   . GERD (gastroesophageal reflux disease)   . Lumbar radiculopathy, chronic 02/04/2015   Right L5  . Obesity   . OSA on CPAP   . Paroxysmal atrial fibrillation ([place])    a. Discovered after stroke.  . Pneumonia 01/1996  . PONV (postoperative nausea and vomiting)   . Prostate CA ([place])   . Recurrent upper respiratory infection (URI)   . Visit for monitoring Tikosyn therapy 09/18/2019    Past Surgical History:  Procedure Laterality Date  . ADENOIDECTOMY    . ANTERIOR CERVICAL DECOMP/DISCECTOMY FUSION  07/2001; 10/2002   "C5-6; C6-7; redo"  . BACK SURGERY    . CARPAL TUNNEL RELEASE Left 10/2015  . COLONOSCOPY W/  POLYPECTOMY  02/2014  . CORONARY ANGIOPLASTY WITH STENT PLACEMENT  05/2003; 12/2005   "mid RCA; mid RCA"  . HAND SURGERY  02/2019   LEFT HAND  . implantable loop recorder placement  02/27/2020  Medtronic Reveal [place] model [id number] S  implantable loop recorder implanted by Dr Rayann Heman for afib management and evaluation of presyncope  . JOINT REPLACEMENT    . KNEE ARTHROSCOPY Left 10/2005  . KNEE ARTHROSCOPY W/ PARTIAL MEDIAL MENISCECTOMY Left 09/2005  . LUMBAR LAMINECTOMY/DECOMPRESSION MICRODISCECTOMY  03/2005   "L4-5"  . POSTERIOR LUMBAR FUSION  10/2003   L5-S1; "plates, screws"  . SHOULDER ARTHROSCOPY Right 08/2011   Debridement of labrum, arthroscopic distal clavicle excision  . SHOULDER OPEN ROTATOR CUFF REPAIR Left 07/2014  . TEE WITHOUT CARDIOVERSION  07/07/2012   Procedure: TRANSESOPHAGEAL ECHOCARDIOGRAM (TEE);  Surgeon: Fay Records, MD;  Location: [facility];  Service: Cardiovascular;  Laterality: N/A;  . TONSILLECTOMY AND ADENOIDECTOMY  ~ 1956  . TOTAL KNEE ARTHROPLASTY Left 10/2006  . TRIGGER FINGER RELEASE Left 10/2015     Current Outpatient Medications  Medication Sig Dispense Refill  . acetaminophen (TYLENOL 8 HOUR ARTHRITIS PAIN) 650 MG CR tablet Take 1,300 mg by mouth as needed for pain (pain).     Marland Kitchen albuterol (VENTOLIN HFA) 108 (90 Base) MCG/ACT inhaler Inhale 2 puffs into the lungs every 4 hours as needed 6.7 each 0  . azelastine (ASTELIN) 0.1 % nasal spray Place 2 sprays into both nostrils 2 (two) times daily. 30 mL 5  . clindamycin (CLEOCIN) 300 MG capsule Take 300 mg by mouth. Uses for dental procedures    . dicyclomine (BENTYL) 20 MG tablet Take 1 tablet by mouth as needed.    . ergocalciferol (VITAMIN D2) 1.25 MG (50000 UT) capsule Take 1 capsule (50,000 Units total) by mouth once a week. 12 capsule 1  . fluticasone (FLONASE) 50 MCG/ACT nasal spray Place 2 sprays into both nostrils daily. 18.2 mL 5  . fluticasone (FLOVENT HFA) 110 MCG/ACT inhaler Inhale 2 puffs  into the lungs in the morning and at bedtime. with spacer and rinse mouth afterwards. 1 each 5  . leuprolide, 6 Month, (ELIGARD) 45 MG injection Inject 45 mg into the skin every 6 (six) months.    . levocetirizine (XYZAL) 5 MG tablet Take 5 mg by mouth every evening.    . loperamide (IMODIUM) 2 MG capsule Take 2 mg by mouth as needed for diarrhea or loose stools.    . magnesium oxide (MAG-OX) 400 MG tablet TAKE 1 TABLET BY MOUTH EVERY DAY 90 tablet 2  . metoprolol succinate (TOPROL XL) 25 MG 24 hr tablet Take 1 tablet (25 mg total) by mouth daily. 90 tablet 3  . Multiple Vitamins-Minerals (MULTIVITAMINS THER. W/MINERALS) TABS Take 1 tablet by mouth daily.    Marland Kitchen oxybutynin (DITROPAN) 5 MG tablet Take 1 tablet by mouth 2 (two) times daily as needed.    . potassium chloride (KLOR-CON) 10 MEQ tablet Take 1 tablet (10 mEq total) by mouth daily. 90 tablet 2  . Saccharomyces boulardii (PROBIOTIC) 250 MG CAPS Take 1 capsule by mouth daily.    . tamsulosin (FLOMAX) 0.4 MG CAPS capsule Take 0.4 mg by mouth daily.    Marland Kitchen topiramate (TOPAMAX) 50 MG tablet Take 2 tablets (100 mg total) by mouth 2 (two) times daily. 360 tablet 3  . traZODone (DESYREL) 50 MG tablet Take 0.5 tablets (25 mg total) by mouth at bedtime as needed for sleep. 45 tablet 3  . XARELTO 20 MG TABS tablet TAKE 1 TABLET BY MOUTH EVERY DAY 90 tablet 2  . atorvastatin (LIPITOR) 40 MG tablet Take 1 tablet (40 mg total) by mouth daily. 90 tablet 3  .  diltiazem (CARDIZEM) 30 MG tablet Take 1 tablet (30 mg total) by mouth 4 (four) times daily as needed. 60 tablet 6  . dofetilide (TIKOSYN) 500 MCG capsule Take 1 capsule (500 mcg total) by mouth 2 (two) times daily. Please keep upcoming appointment in February for future refills. Thank you 180 capsule 3  . fenofibrate 160 MG tablet TAKE 1 TABLET DAILY. PLEASE KEEP UPCOMING APPT IN MARCH WITH DR. Acie Fredrickson BEFORE ANYMORE REFILLS. 90 tablet 3  . nitroGLYCERIN (NITROSTAT) 0.4 MG SL tablet Place 1 tablet (0.4  mg total) under the tongue every 5 (five) minutes as needed for chest pain. 25 tablet 5  . omeprazole (PRILOSEC) 10 MG capsule TAKE 1 CAPSULE DAILY. 90 capsule 3   No current facility-administered medications for this visit.    Allergies:   Amoxicillin, Fosinopril, Hydromorphone, Ampicillin, Codeine, Monopril [fosinopril sodium], and Percocet [oxycodone-acetaminophen]    Social History:  The patient  reports that he quit smoking about 17 years ago. His smoking use included cigarettes. He has a 34.00 pack-year smoking history. He has never used smokeless tobacco. He reports that he does not drink alcohol and does not use drugs.   Family History:  The patient's family history includes Blindness in his son; Heart attack in his father; Heart disease in his father; Hypertension in his mother; Parkinson's disease in his brother; Prostate cancer in his brother.    ROS: As per current history otherwise negative.   Physical Exam: Blood pressure 116/72, pulse 91, height 6' 1.54" (1.868 m), weight 256 lb (116.1 kg), SpO2 96 %.  GEN:  Well nourished, well developed in no acute distress HEENT: Normal NECK: No JVD; No carotid bruits LYMPHATICS: No lymphadenopathy CARDIAC: RRR , no murmurs, rubs, gallops RESPIRATORY:  Clear to auscultation without rales, wheezing or rhonchi  ABDOMEN: Soft, non-tender, non-distended MUSCULOSKELETAL:  No edema; No deformity  SKIN: Warm and dry NEUROLOGIC:  Alert and oriented x 3  EKG: January 21, 2021: Normal sinus rhythm at 91.  QTc is 477 ms.  Recent Labs: 09/11/2020: Magnesium 1.9 10/08/2020: ALT 28; BUN 21; Creatinine 1.06; Hemoglobin 11.7; Platelets 240; Potassium 4.3; Sodium 140    Lipid Panel    Component Value Date/Time   CHOL 129 09/20/2018 1004   CHOL 108 01/05/2013 0748   TRIG 81 09/20/2018 1004   TRIG 84 01/05/2013 0748   HDL 38 (L) 09/20/2018 1004   HDL 29 (L) 01/05/2013 0748   CHOLHDL 3.4 09/20/2018 1004   CHOLHDL 3.7 08/30/2016 0840   VLDL  21 08/30/2016 0840   LDLCALC 75 09/20/2018 1004   LDLCALC 62 01/05/2013 0748      Wt Readings from Last 3 Encounters:  01/21/21 256 lb (116.1 kg)  11/20/20 266 lb (120.7 kg)  11/14/20 264 lb (119.7 kg)      Other studies Reviewed: Additional studies/ records that were reviewed today include: . Review of the above records demonstrates:    ASSESSMENT AND PLAN:  1.  Coronary artery disease-     .  He is not having any episodes of angina.  Continue current medications.  2. Hypertension -blood pressure is well controlled.  3. Carotid artery disease -  .     4. Hyperlipidemia -continue medications.  Check labs today.  5. 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  6.  Paroxysmal atrial fibrillation - discovered after his stroke-  CHADS2VASC is   21 ( age, CAD, CVA, HTN, ) , currently on Tikosyn.  This seems to have  his A. fib very well controlled.  Unfortunately he is also been started on Eligard.  For his prostate cancer.  There apparently is an interaction between the Eligard and Tikosyn that potentiates the effects of the Eligard.  He finishes his Eligard therapy in June but the effects will last for as long as 6 months.  I wonder if we might consider A. fib ablation so that he can stop the Tikosyn after several months.  This may give him half a year or so of tolerating the Eligard better.  For now we will continue with same medications.  I will send a message to Dr. Rayann Heman to consider A. fib ablation.  7. Fatigue:     8. Iron deficiency:    Current medicines are reviewed at length with the patient today.  The patient does not have concerns regarding medicines.  The following changes have been made:  no change  Labs/ tests ordered today include:   Orders Placed This Encounter  Procedures  . ALT  . Basic metabolic panel  . Lipid panel  . Magnesium  . EKG 12-Lead    Disposition:   FU with me in 6 months    Mertie Moores, MD  01/21/2021 4:31 PM    [facility] [address], [place], [place]  [address] Phone: [phone]; Fax: [phone]

## 2021-01-21 ENCOUNTER — Other Ambulatory Visit: Payer: Self-pay

## 2021-01-21 ENCOUNTER — Ambulatory Visit: Payer: Medicare PPO | Admitting: Adult Health

## 2021-01-21 ENCOUNTER — Encounter: Payer: Self-pay | Admitting: Cardiovascular Disease

## 2021-01-21 ENCOUNTER — Ambulatory Visit: Payer: Medicare PPO | Admitting: Cardiovascular Disease

## 2021-01-21 VITALS — BP 116/72 | HR 91 | Ht 73.54 in | Wt 256.0 lb

## 2021-01-21 DIAGNOSIS — I480 Paroxysmal atrial fibrillation: Secondary | ICD-10-CM

## 2021-01-21 MED ORDER — NITROGLYCERIN 0.4 MG SL SUBL: 0.4000 mg | SUBLINGUAL_TABLET | SUBLINGUAL | 5 refills | Status: DC | PRN

## 2021-01-21 MED ORDER — DOFETILIDE 500 MCG PO CAPS: 500.0000 ug | ORAL_CAPSULE | Freq: Two times a day (BID) | ORAL | 3 refills | Status: DC

## 2021-01-21 MED ORDER — OMEPRAZOLE 10 MG PO CPDR: DELAYED_RELEASE_CAPSULE | ORAL | 3 refills | Status: DC

## 2021-01-21 MED ORDER — DILTIAZEM HCL 30 MG PO TABS: 30.0000 mg | ORAL_TABLET | Freq: Four times a day (QID) | ORAL | 6 refills | Status: DC | PRN

## 2021-01-21 MED ORDER — ATORVASTATIN CALCIUM 40 MG PO TABS: 1.0000 | ORAL_TABLET | Freq: Every day | ORAL | 3 refills | Status: DC

## 2021-01-21 MED ORDER — FENOFIBRATE 160 MG PO TABS: ORAL_TABLET | ORAL | 3 refills | Status: DC

## 2021-01-21 NOTE — Patient Instructions (Signed)
Medication Instructions:  Your physician recommends that you continue on your current medications as directed. Please refer to the Current Medication list given to you today.  *If you need a refill on your cardiac medications before your next appointment, please call your pharmacy*   Lab Work: TODAY: BMP, Mg, Lipids, ALT If you have labs (blood work) drawn today and your tests are completely normal, you will receive your results only by: Marland Kitchen MyChart Message (if you have MyChart) OR . A paper copy in the mail If you have any lab test that is abnormal or we need to change your treatment, we will call you to review the results.   Testing/Procedures: none   Follow-Up: At [facility], you and your health needs are our priority.  As part of our continuing mission to provide you with exceptional heart care, we have created designated Provider Care Teams.  These Care Teams include your primary Cardiologist (physician) and Advanced Practice Providers (APPs -  Physician Assistants and Nurse Practitioners) who all work together to provide you with the care you need, when you need it.   Your next appointment:   1 year(s)  The format for your next appointment:   In Person  Provider:   You may see Mertie Moores, MD or one of the following Advanced Practice Providers on your designated Care Team:    Richardson Dopp, PA-C  [place], [place]

## 2021-01-22 ENCOUNTER — Encounter: Payer: Self-pay | Admitting: Family Medicine

## 2021-01-22 ENCOUNTER — Ambulatory Visit (INDEPENDENT_AMBULATORY_CARE_PROVIDER_SITE_OTHER): Payer: Medicare PPO

## 2021-01-22 DIAGNOSIS — R5383 Other fatigue: Secondary | ICD-10-CM | POA: Diagnosis not present

## 2021-01-22 DIAGNOSIS — R55 Syncope and collapse: Secondary | ICD-10-CM | POA: Diagnosis not present

## 2021-01-22 DIAGNOSIS — R2681 Unsteadiness on feet: Secondary | ICD-10-CM | POA: Diagnosis not present

## 2021-01-22 DIAGNOSIS — R2689 Other abnormalities of gait and mobility: Secondary | ICD-10-CM | POA: Diagnosis not present

## 2021-01-22 DIAGNOSIS — R531 Weakness: Secondary | ICD-10-CM | POA: Diagnosis not present

## 2021-01-22 LAB — BASIC METABOLIC PANEL
BUN/Creatinine Ratio: 18 (ref 10–24)
BUN: 17 mg/dL (ref 8–27)
CO2: 17 mmol/L — ABNORMAL LOW (ref 20–29)
Calcium: 9.5 mg/dL (ref 8.6–10.2)
Chloride: 101 mmol/L (ref 96–106)
Creatinine, Ser: 0.93 mg/dL (ref 0.76–1.27)
Glucose: 148 mg/dL — ABNORMAL HIGH (ref 65–99)
Potassium: 4.7 mmol/L (ref 3.5–5.2)
Sodium: 137 mmol/L (ref 134–144)
eGFR: 87 mL/min/{1.73_m2} (ref >59)

## 2021-01-22 LAB — LIPID PANEL
Chol/HDL Ratio: 2.8 ratio (ref 0.0–5.0)
Cholesterol, Total: 111 mg/dL (ref 100–199)
HDL: 39 mg/dL — ABNORMAL LOW (ref >39)
LDL Chol Calc (NIH): 55 mg/dL (ref 0–99)
Triglycerides: 88 mg/dL (ref 0–149)
VLDL Cholesterol Cal: 17 mg/dL (ref 5–40)

## 2021-01-22 LAB — CUP PACEART REMOTE DEVICE CHECK
Date Time Interrogation Session: 20220421100242
Implantable Pulse Generator Implant Date: 20210526

## 2021-01-22 LAB — ALT: ALT: 19 IU/L (ref 0–44)

## 2021-01-22 LAB — MAGNESIUM: Magnesium: 1.8 mg/dL (ref 1.6–2.3)

## 2021-01-27 DIAGNOSIS — R2689 Other abnormalities of gait and mobility: Secondary | ICD-10-CM | POA: Diagnosis not present

## 2021-01-27 DIAGNOSIS — R2681 Unsteadiness on feet: Secondary | ICD-10-CM | POA: Diagnosis not present

## 2021-01-27 DIAGNOSIS — R5383 Other fatigue: Secondary | ICD-10-CM | POA: Diagnosis not present

## 2021-01-27 DIAGNOSIS — R531 Weakness: Secondary | ICD-10-CM | POA: Diagnosis not present

## 2021-01-28 ENCOUNTER — Ambulatory Visit: Payer: Medicare PPO | Admitting: Adult Health

## 2021-01-28 ENCOUNTER — Encounter: Payer: Self-pay | Admitting: Adult Health

## 2021-01-29 DIAGNOSIS — R531 Weakness: Secondary | ICD-10-CM | POA: Diagnosis not present

## 2021-01-29 DIAGNOSIS — R2681 Unsteadiness on feet: Secondary | ICD-10-CM | POA: Diagnosis not present

## 2021-01-29 DIAGNOSIS — R5383 Other fatigue: Secondary | ICD-10-CM | POA: Diagnosis not present

## 2021-01-29 DIAGNOSIS — R2689 Other abnormalities of gait and mobility: Secondary | ICD-10-CM | POA: Diagnosis not present

## 2021-02-02 DIAGNOSIS — R2681 Unsteadiness on feet: Secondary | ICD-10-CM | POA: Diagnosis not present

## 2021-02-02 DIAGNOSIS — R531 Weakness: Secondary | ICD-10-CM | POA: Diagnosis not present

## 2021-02-02 DIAGNOSIS — R2689 Other abnormalities of gait and mobility: Secondary | ICD-10-CM | POA: Diagnosis not present

## 2021-02-02 DIAGNOSIS — R5383 Other fatigue: Secondary | ICD-10-CM | POA: Diagnosis not present

## 2021-02-03 DIAGNOSIS — G4733 Obstructive sleep apnea (adult) (pediatric): Secondary | ICD-10-CM | POA: Diagnosis not present

## 2021-02-09 NOTE — Progress Notes (Signed)
Carelink Summary Report / Loop Recorder 

## 2021-02-10 DIAGNOSIS — R2689 Other abnormalities of gait and mobility: Secondary | ICD-10-CM | POA: Diagnosis not present

## 2021-02-10 DIAGNOSIS — R5383 Other fatigue: Secondary | ICD-10-CM | POA: Diagnosis not present

## 2021-02-10 DIAGNOSIS — M545 Low back pain, unspecified: Secondary | ICD-10-CM | POA: Diagnosis not present

## 2021-02-10 DIAGNOSIS — R2681 Unsteadiness on feet: Secondary | ICD-10-CM | POA: Diagnosis not present

## 2021-02-10 DIAGNOSIS — M546 Pain in thoracic spine: Secondary | ICD-10-CM | POA: Diagnosis not present

## 2021-02-10 DIAGNOSIS — R531 Weakness: Secondary | ICD-10-CM | POA: Diagnosis not present

## 2021-02-20 NOTE — Progress Notes (Deleted)
Assessment/Plan:   1.  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  This can certainly happen with longstanding and severe essential tremor, but he really does not have this.  His DaTscan was somewhat equivocal, but demonstrated asymmetric loss of radiotracer activity in the right putamen compared to that of the left.  My biggest concern is that his identical twin presented in a very similar fashion several years ago, and has now progressed and has Parkinson's disease.  2.  Sleep apnea  -Following with Dr. Brett Fairy  3.  [facility] with hematology.   Subjective:   Corey [name] was seen today in follow up for Parkinsons disease.  My previous records were reviewed prior to todays visit as well as outside records available to me. Pt denies falls.  Pt denies lightheadedness, near syncope.  No hallucinations.  Mood has been good.  Patient has been to therapy since our last visit.  Those notes have been reviewed.  Current prescribed movement disorder medications: None   PREVIOUS MEDICATIONS: {Parkinson's RX:18200}  ALLERGIES:   Allergies  Allergen Reactions  . Amoxicillin Swelling  . Fosinopril Other (See Comments)  . Hydromorphone Other (See Comments) and Nausea Only  . Ampicillin Rash  . Codeine Nausea Only and Other (See Comments)    Heavy amounts cause nausea  . Monopril [Fosinopril Sodium] Other (See Comments)    Muscle aches & pain  . Percocet [Oxycodone-Acetaminophen] Nausea And Vomiting    CURRENT MEDICATIONS:  Outpatient Encounter Medications as of 02/23/2021  Medication Sig  . acetaminophen (TYLENOL 8 HOUR ARTHRITIS PAIN) 650 MG CR tablet Take 1,300 mg by mouth as needed for pain (pain).   Marland Kitchen albuterol (VENTOLIN HFA) 108 (90 Base) MCG/ACT inhaler Inhale 2 puffs into the lungs every 4 hours as needed  . atorvastatin (LIPITOR) 40 MG tablet Take 1 tablet (40 mg total) by mouth daily.  Marland Kitchen  azelastine (ASTELIN) 0.1 % nasal spray Place 2 sprays into both nostrils 2 (two) times daily.  . clindamycin (CLEOCIN) 300 MG capsule Take 300 mg by mouth. Uses for dental procedures  . dicyclomine (BENTYL) 20 MG tablet Take 1 tablet by mouth as needed.  . diltiazem (CARDIZEM) 30 MG tablet Take 1 tablet (30 mg total) by mouth 4 (four) times daily as needed.  . dofetilide (TIKOSYN) 500 MCG capsule Take 1 capsule (500 mcg total) by mouth 2 (two) times daily. Please keep upcoming appointment in February for future refills. Thank you  . ergocalciferol (VITAMIN D2) 1.25 MG (50000 UT) capsule Take 1 capsule (50,000 Units total) by mouth once a week.  . fenofibrate 160 MG tablet TAKE 1 TABLET DAILY. PLEASE KEEP UPCOMING APPT IN MARCH WITH DR. Acie Fredrickson BEFORE ANYMORE REFILLS.  . fluticasone (FLONASE) 50 MCG/ACT nasal spray Place 2 sprays into both nostrils daily.  . fluticasone (FLOVENT HFA) 110 MCG/ACT inhaler Inhale 2 puffs into the lungs in the morning and at bedtime. with spacer and rinse mouth afterwards.  Marland Kitchen leuprolide, 6 Month, (ELIGARD) 45 MG injection Inject 45 mg into the skin every 6 (six) months.  . levocetirizine (XYZAL) 5 MG tablet Take 5 mg by mouth every evening.  . loperamide (IMODIUM) 2 MG capsule Take 2 mg by mouth as needed for diarrhea or loose stools.  . magnesium oxide (MAG-OX) 400 MG tablet TAKE 1 TABLET BY MOUTH EVERY DAY  . metoprolol succinate (TOPROL XL) 25 MG 24 hr tablet Take 1  tablet (25 mg total) by mouth daily.  . Multiple Vitamins-Minerals (MULTIVITAMINS THER. W/MINERALS) TABS Take 1 tablet by mouth daily.  . nitroGLYCERIN (NITROSTAT) 0.4 MG SL tablet Place 1 tablet (0.4 mg total) under the tongue every 5 (five) minutes as needed for chest pain.  Marland Kitchen omeprazole (PRILOSEC) 10 MG capsule TAKE 1 CAPSULE DAILY.  Marland Kitchen oxybutynin (DITROPAN) 5 MG tablet Take 1 tablet by mouth 2 (two) times daily as needed.  . potassium chloride (KLOR-CON) 10 MEQ tablet Take 1 tablet (10 mEq total) by  mouth daily.  . Saccharomyces boulardii (PROBIOTIC) 250 MG CAPS Take 1 capsule by mouth daily.  . tamsulosin (FLOMAX) 0.4 MG CAPS capsule Take 0.4 mg by mouth daily.  Marland Kitchen topiramate (TOPAMAX) 50 MG tablet Take 2 tablets (100 mg total) by mouth 2 (two) times daily.  . traZODone (DESYREL) 50 MG tablet Take 0.5 tablets (25 mg total) by mouth at bedtime as needed for sleep.  Alveda Reasons 20 MG TABS tablet TAKE 1 TABLET BY MOUTH EVERY DAY   No facility-administered encounter medications on file as of 02/23/2021.    Objective:   PHYSICAL EXAMINATION:    VITALS:  There were no vitals filed for this visit.  GEN:  The patient appears stated age and is in NAD. HEENT:  Normocephalic, atraumatic.  The mucous membranes are moist. The superficial temporal arteries are without ropiness or tenderness. CV:  RRR Lungs:  CTAB Neck/HEME:  There are no carotid bruits bilaterally.  Neurological examination:  Orientation: The patient is alert and oriented x3. Cranial nerves: There is good facial symmetry with*** facial hypomimia. The speech is fluent and clear. Soft palate rises symmetrically and there is no tongue deviation. Hearing is intact to conversational tone. Sensation: Sensation is intact to light touch throughout Motor: Strength is at least antigravity x4.  Movement examination: Tone: There is ***tone in the *** Abnormal movements: *** Coordination:  There is *** decremation with RAM's, *** Gait and Station: The patient has *** difficulty arising out of a deep-seated chair without the use of the hands. The patient's stride length is ***.  The patient has a *** pull test.     I have reviewed and interpreted the following labs independently    Chemistry      Component Value Date/Time   NA 137 01/21/2021 1553   K 4.7 01/21/2021 1553   CL 101 01/21/2021 1553   CO2 17 (L) 01/21/2021 1553   BUN 17 01/21/2021 1553   CREATININE 0.93 01/21/2021 1553   CREATININE 1.06 10/08/2020 1213   CREATININE  1.04 08/30/2016 0840      Component Value Date/Time   CALCIUM 9.5 01/21/2021 1553   ALKPHOS 50 10/08/2020 1213   AST 23 10/08/2020 1213   ALT 19 01/21/2021 1553   ALT 28 10/08/2020 1213   BILITOT 0.6 10/08/2020 1213       Lab Results  Component Value Date   WBC 5.0 10/08/2020   HGB 11.7 (L) 10/08/2020   HCT 36.1 (L) 10/08/2020   MCV 96.3 10/08/2020   PLT 240 10/08/2020    Lab Results  Component Value Date   TSH 2.310 07/10/2019     Total time spent on today's visit was ***30 minutes, including both face-to-face time and nonface-to-face time.  Time included that spent on review of records (prior notes available to me/labs/imaging if pertinent), discussing treatment and goals, answering patient's questions and coordinating care.  Cc:  [name], MD

## 2021-02-23 ENCOUNTER — Ambulatory Visit: Payer: Medicare PPO | Admitting: Neurology

## 2021-02-24 ENCOUNTER — Ambulatory Visit (INDEPENDENT_AMBULATORY_CARE_PROVIDER_SITE_OTHER): Payer: Medicare PPO

## 2021-02-24 ENCOUNTER — Inpatient Hospital Stay (HOSPITAL_COMMUNITY)
Admission: EM | Admit: 2021-02-24 | Discharge: 2021-02-27 | DRG: 841 | Disposition: A | Discharge status: Home / Self Care | Payer: Medicare PPO | Attending: Internal Medicine | Admitting: Internal Medicine

## 2021-02-24 ENCOUNTER — Emergency Department (HOSPITAL_COMMUNITY): Payer: Medicare PPO

## 2021-02-24 DIAGNOSIS — Z96652 Presence of left artificial knee joint: Secondary | ICD-10-CM | POA: Diagnosis present

## 2021-02-24 DIAGNOSIS — C3491 Malignant neoplasm of unspecified part of right bronchus or lung: Secondary | ICD-10-CM | POA: Diagnosis not present

## 2021-02-24 DIAGNOSIS — Z20822 Contact with and (suspected) exposure to covid-19: Secondary | ICD-10-CM | POA: Diagnosis not present

## 2021-02-24 DIAGNOSIS — Z79899 Other long term (current) drug therapy: Secondary | ICD-10-CM

## 2021-02-24 DIAGNOSIS — R079 Chest pain, unspecified: Secondary | ICD-10-CM | POA: Diagnosis not present

## 2021-02-24 DIAGNOSIS — Z8042 Family history of malignant neoplasm of prostate: Secondary | ICD-10-CM | POA: Diagnosis not present

## 2021-02-24 DIAGNOSIS — Z87891 Personal history of nicotine dependence: Secondary | ICD-10-CM | POA: Diagnosis not present

## 2021-02-24 DIAGNOSIS — M5416 Radiculopathy, lumbar region: Secondary | ICD-10-CM | POA: Diagnosis not present

## 2021-02-24 DIAGNOSIS — D72810 Lymphocytopenia: Secondary | ICD-10-CM | POA: Diagnosis present

## 2021-02-24 DIAGNOSIS — I2510 Atherosclerotic heart disease of native coronary artery without angina pectoris: Secondary | ICD-10-CM | POA: Diagnosis not present

## 2021-02-24 DIAGNOSIS — G4733 Obstructive sleep apnea (adult) (pediatric): Secondary | ICD-10-CM | POA: Diagnosis present

## 2021-02-24 DIAGNOSIS — C771 Secondary and unspecified malignant neoplasm of intrathoracic lymph nodes: Principal | ICD-10-CM | POA: Diagnosis present

## 2021-02-24 DIAGNOSIS — E785 Hyperlipidemia, unspecified: Secondary | ICD-10-CM | POA: Diagnosis present

## 2021-02-24 DIAGNOSIS — Z880 Allergy status to penicillin: Secondary | ICD-10-CM

## 2021-02-24 DIAGNOSIS — I4891 Unspecified atrial fibrillation: Secondary | ICD-10-CM | POA: Diagnosis not present

## 2021-02-24 DIAGNOSIS — I480 Paroxysmal atrial fibrillation: Secondary | ICD-10-CM | POA: Diagnosis present

## 2021-02-24 DIAGNOSIS — Z955 Presence of coronary angioplasty implant and graft: Secondary | ICD-10-CM

## 2021-02-24 DIAGNOSIS — Z9989 Dependence on other enabling machines and devices: Secondary | ICD-10-CM | POA: Diagnosis not present

## 2021-02-24 DIAGNOSIS — E440 Moderate protein-calorie malnutrition: Secondary | ICD-10-CM | POA: Diagnosis not present

## 2021-02-24 DIAGNOSIS — Z881 Allergy status to other antibiotic agents status: Secondary | ICD-10-CM

## 2021-02-24 DIAGNOSIS — Z8546 Personal history of malignant neoplasm of prostate: Secondary | ICD-10-CM | POA: Diagnosis not present

## 2021-02-24 DIAGNOSIS — Z923 Personal history of irradiation: Secondary | ICD-10-CM

## 2021-02-24 DIAGNOSIS — R918 Other nonspecific abnormal finding of lung field: Secondary | ICD-10-CM | POA: Diagnosis not present

## 2021-02-24 DIAGNOSIS — Z8673 Personal history of transient ischemic attack (TIA), and cerebral infarction without residual deficits: Secondary | ICD-10-CM

## 2021-02-24 DIAGNOSIS — Z981 Arthrodesis status: Secondary | ICD-10-CM

## 2021-02-24 DIAGNOSIS — I10 Essential (primary) hypertension: Secondary | ICD-10-CM | POA: Diagnosis not present

## 2021-02-24 DIAGNOSIS — J90 Pleural effusion, not elsewhere classified: Secondary | ICD-10-CM

## 2021-02-24 DIAGNOSIS — G8929 Other chronic pain: Secondary | ICD-10-CM | POA: Diagnosis present

## 2021-02-24 DIAGNOSIS — C3432 Malignant neoplasm of lower lobe, left bronchus or lung: Secondary | ICD-10-CM | POA: Diagnosis present

## 2021-02-24 DIAGNOSIS — Z885 Allergy status to narcotic agent status: Secondary | ICD-10-CM

## 2021-02-24 DIAGNOSIS — R0602 Shortness of breath: Secondary | ICD-10-CM

## 2021-02-24 DIAGNOSIS — C801 Malignant (primary) neoplasm, unspecified: Secondary | ICD-10-CM | POA: Diagnosis not present

## 2021-02-24 DIAGNOSIS — K219 Gastro-esophageal reflux disease without esophagitis: Secondary | ICD-10-CM | POA: Diagnosis present

## 2021-02-24 DIAGNOSIS — G250 Essential tremor: Secondary | ICD-10-CM | POA: Diagnosis not present

## 2021-02-24 DIAGNOSIS — I248 Other forms of acute ischemic heart disease: Secondary | ICD-10-CM | POA: Diagnosis not present

## 2021-02-24 DIAGNOSIS — Z888 Allergy status to other drugs, medicaments and biological substances status: Secondary | ICD-10-CM

## 2021-02-24 DIAGNOSIS — R109 Unspecified abdominal pain: Secondary | ICD-10-CM

## 2021-02-24 DIAGNOSIS — J9811 Atelectasis: Secondary | ICD-10-CM | POA: Diagnosis not present

## 2021-02-24 DIAGNOSIS — Z7901 Long term (current) use of anticoagulants: Secondary | ICD-10-CM

## 2021-02-24 DIAGNOSIS — J449 Chronic obstructive pulmonary disease, unspecified: Secondary | ICD-10-CM | POA: Diagnosis present

## 2021-02-24 DIAGNOSIS — C61 Malignant neoplasm of prostate: Secondary | ICD-10-CM | POA: Diagnosis not present

## 2021-02-24 DIAGNOSIS — J910 Malignant pleural effusion: Secondary | ICD-10-CM | POA: Diagnosis present

## 2021-02-24 DIAGNOSIS — Z8249 Family history of ischemic heart disease and other diseases of the circulatory system: Secondary | ICD-10-CM | POA: Diagnosis not present

## 2021-02-24 DIAGNOSIS — R599 Enlarged lymph nodes, unspecified: Secondary | ICD-10-CM | POA: Diagnosis present

## 2021-02-24 DIAGNOSIS — I517 Cardiomegaly: Secondary | ICD-10-CM | POA: Diagnosis not present

## 2021-02-24 DIAGNOSIS — K4090 Unilateral inguinal hernia, without obstruction or gangrene, not specified as recurrent: Secondary | ICD-10-CM | POA: Diagnosis not present

## 2021-02-24 DIAGNOSIS — K5750 Diverticulosis of both small and large intestine without perforation or abscess without bleeding: Secondary | ICD-10-CM | POA: Diagnosis not present

## 2021-02-24 HISTORY — DX: Pleural effusion, not elsewhere classified: J90

## 2021-02-24 HISTORY — DX: Anemia, unspecified: D64.9

## 2021-02-24 HISTORY — DX: Anxiety disorder, unspecified: F41.9

## 2021-02-24 LAB — TROPONIN I (HIGH SENSITIVITY)
Troponin I (High Sensitivity): 7 ng/L (ref <18)
Troponin I (High Sensitivity): 9 ng/L (ref <18)

## 2021-02-24 LAB — BASIC METABOLIC PANEL
Anion gap: 9 (ref 5–15)
BUN: 16 mg/dL (ref 8–23)
CO2: 23 mmol/L (ref 22–32)
Calcium: 9.4 mg/dL (ref 8.9–10.3)
Chloride: 104 mmol/L (ref 98–111)
Creatinine, Ser: 1.03 mg/dL (ref 0.61–1.24)
GFR, Estimated: >60 mL/min (ref >60)
Glucose, Bld: 119 mg/dL — ABNORMAL HIGH (ref 70–99)
Potassium: 4 mmol/L (ref 3.5–5.1)
Sodium: 136 mmol/L (ref 135–145)

## 2021-02-24 LAB — CBC
HCT: 39 % (ref 39.0–52.0)
Hemoglobin: 12.1 g/dL — ABNORMAL LOW (ref 13.0–17.0)
MCH: 29.6 pg (ref 26.0–34.0)
MCHC: 31 g/dL (ref 30.0–36.0)
MCV: 95.4 fL (ref 80.0–100.0)
Platelets: 326 10*3/uL (ref 150–400)
RBC: 4.09 MIL/uL — ABNORMAL LOW (ref 4.22–5.81)
RDW: 15.8 % — ABNORMAL HIGH (ref 11.5–15.5)
WBC: 5.5 10*3/uL (ref 4.0–10.5)
nRBC: 0 % (ref 0.0–0.2)

## 2021-02-24 LAB — RESP PANEL BY RT-PCR (FLU A&B, COVID) ARPGX2
Influenza A by PCR: NEGATIVE
Influenza B by PCR: NEGATIVE
SARS Coronavirus 2 by RT PCR: NEGATIVE

## 2021-02-24 MED ORDER — AZELASTINE HCL 0.1 % NA SOLN
2.0000 | Freq: Two times a day (BID) | NASAL | Status: DC
  Administered 2021-02-25 – 2021-02-27 (×6): 2 via NASAL
  Filled 2021-02-24 (×2): qty 30

## 2021-02-24 MED ORDER — TOPIRAMATE 25 MG PO TABS
100.0000 mg | ORAL_TABLET | Freq: Two times a day (BID) | ORAL | Status: DC
  Administered 2021-02-24 – 2021-02-27 (×6): 100 mg via ORAL
  Filled 2021-02-24 (×7): qty 4

## 2021-02-24 MED ORDER — VITAMIN D (ERGOCALCIFEROL) 1.25 MG (50000 UNIT) PO CAPS
50000.0000 [IU] | ORAL_CAPSULE | ORAL | Status: DC
  Filled 2021-02-24: qty 1

## 2021-02-24 MED ORDER — FLUTICASONE PROPIONATE 50 MCG/ACT NA SUSP
2.0000 | Freq: Every day | NASAL | Status: DC
  Administered 2021-02-25 – 2021-02-27 (×4): 2 via NASAL
  Filled 2021-02-24 (×2): qty 16

## 2021-02-24 MED ORDER — ATORVASTATIN CALCIUM 40 MG PO TABS
40.0000 mg | ORAL_TABLET | Freq: Every day | ORAL | Status: DC
  Administered 2021-02-24 – 2021-02-27 (×4): 40 mg via ORAL
  Filled 2021-02-24 (×4): qty 1

## 2021-02-24 MED ORDER — ADULT MULTIVITAMIN W/MINERALS CH
1.0000 | ORAL_TABLET | Freq: Every day | ORAL | Status: DC
  Administered 2021-02-24 – 2021-02-27 (×4): 1 via ORAL
  Filled 2021-02-24 (×3): qty 1

## 2021-02-24 MED ORDER — MAGNESIUM OXIDE -MG SUPPLEMENT 400 (240 MG) MG PO TABS
400.0000 mg | ORAL_TABLET | Freq: Every day | ORAL | Status: DC
  Administered 2021-02-24 – 2021-02-27 (×4): 400 mg via ORAL
  Filled 2021-02-24 (×5): qty 1

## 2021-02-24 MED ORDER — DOFETILIDE 500 MCG PO CAPS
500.0000 ug | ORAL_CAPSULE | Freq: Two times a day (BID) | ORAL | Status: DC
  Administered 2021-02-24 – 2021-02-27 (×6): 500 ug via ORAL
  Filled 2021-02-24 (×7): qty 1

## 2021-02-24 MED ORDER — LORATADINE 10 MG PO TABS
10.0000 mg | ORAL_TABLET | Freq: Every evening | ORAL | Status: DC
  Administered 2021-02-24 – 2021-02-26 (×3): 10 mg via ORAL
  Filled 2021-02-24 (×2): qty 1

## 2021-02-24 MED ORDER — METOPROLOL SUCCINATE ER 25 MG PO TB24
25.0000 mg | ORAL_TABLET | Freq: Every day | ORAL | Status: DC
  Administered 2021-02-24 – 2021-02-27 (×4): 25 mg via ORAL
  Filled 2021-02-24 (×4): qty 1

## 2021-02-24 MED ORDER — POTASSIUM CHLORIDE CRYS ER 10 MEQ PO TBCR
10.0000 meq | EXTENDED_RELEASE_TABLET | Freq: Every day | ORAL | Status: DC
  Administered 2021-02-24 – 2021-02-27 (×4): 10 meq via ORAL
  Filled 2021-02-24 (×5): qty 1

## 2021-02-24 MED ORDER — RISAQUAD PO CAPS
1.0000 | ORAL_CAPSULE | Freq: Every day | ORAL | Status: DC
  Administered 2021-02-24 – 2021-02-27 (×4): 1 via ORAL
  Filled 2021-02-24 (×4): qty 1

## 2021-02-24 MED ORDER — FENOFIBRATE 54 MG PO TABS
54.0000 mg | ORAL_TABLET | Freq: Every day | ORAL | Status: DC
  Administered 2021-02-24 – 2021-02-27 (×4): 54 mg via ORAL
  Filled 2021-02-24 (×4): qty 1

## 2021-02-24 MED ORDER — LEVOCETIRIZINE DIHYDROCHLORIDE 5 MG PO TABS: 5.0000 mg | ORAL_TABLET | Freq: Every evening | ORAL | Status: DC

## 2021-02-24 MED ORDER — ACETAMINOPHEN ER 650 MG PO TBCR: 1300.0000 mg | EXTENDED_RELEASE_TABLET | ORAL | Status: DC | PRN

## 2021-02-24 MED ORDER — TRAZODONE HCL 50 MG PO TABS
25.0000 mg | ORAL_TABLET | Freq: Every evening | ORAL | Status: DC | PRN
  Administered 2021-02-25: 25 mg via ORAL
  Filled 2021-02-24: qty 1

## 2021-02-24 MED ORDER — TAMSULOSIN HCL 0.4 MG PO CAPS
0.4000 mg | ORAL_CAPSULE | Freq: Every day | ORAL | Status: DC
  Administered 2021-02-24 – 2021-02-27 (×4): 0.4 mg via ORAL
  Filled 2021-02-24 (×4): qty 1

## 2021-02-24 MED ORDER — DICYCLOMINE HCL 20 MG PO TABS
20.0000 mg | ORAL_TABLET | ORAL | Status: DC | PRN
  Filled 2021-02-24 (×2): qty 1

## 2021-02-24 MED ORDER — THERA M PLUS PO TABS: 1.0000 | ORAL_TABLET | Freq: Every day | ORAL | Status: DC

## 2021-02-24 MED ORDER — DILTIAZEM HCL 30 MG PO TABS
30.0000 mg | ORAL_TABLET | Freq: Four times a day (QID) | ORAL | Status: DC
  Administered 2021-02-24 – 2021-02-25 (×3): 30 mg via ORAL
  Filled 2021-02-24 (×5): qty 1

## 2021-02-24 MED ORDER — ENOXAPARIN SODIUM 40 MG/0.4ML IJ SOSY: 40.0000 mg | PREFILLED_SYRINGE | INTRAMUSCULAR | Status: DC

## 2021-02-24 MED ORDER — OXYBUTYNIN CHLORIDE 5 MG PO TABS
5.0000 mg | ORAL_TABLET | Freq: Two times a day (BID) | ORAL | Status: DC | PRN
  Filled 2021-02-24: qty 1

## 2021-02-24 MED ORDER — PROBIOTIC 250 MG PO CAPS: 1.0000 | ORAL_CAPSULE | Freq: Every day | ORAL | Status: DC

## 2021-02-24 MED ORDER — ENOXAPARIN SODIUM 40 MG/0.4ML IJ SOSY
40.0000 mg | PREFILLED_SYRINGE | INTRAMUSCULAR | Status: DC
  Administered 2021-02-26: 40 mg via SUBCUTANEOUS
  Filled 2021-02-24: qty 0.4

## 2021-02-24 MED ORDER — GUAIFENESIN ER 600 MG PO TB12
1200.0000 mg | ORAL_TABLET | Freq: Two times a day (BID) | ORAL | Status: DC
  Administered 2021-02-24 – 2021-02-27 (×6): 1200 mg via ORAL
  Filled 2021-02-24 (×7): qty 2

## 2021-02-24 MED ORDER — ALBUTEROL SULFATE (2.5 MG/3ML) 0.083% IN NEBU
3.0000 mL | INHALATION_SOLUTION | Freq: Four times a day (QID) | RESPIRATORY_TRACT | Status: DC | PRN
  Administered 2021-02-25: 3 mL via RESPIRATORY_TRACT
  Filled 2021-02-24: qty 3

## 2021-02-24 MED ORDER — BUDESONIDE 0.5 MG/2ML IN SUSP
0.5000 mg | Freq: Two times a day (BID) | RESPIRATORY_TRACT | Status: DC
  Administered 2021-02-25 – 2021-02-27 (×5): 0.5 mg via RESPIRATORY_TRACT
  Filled 2021-02-24 (×7): qty 2

## 2021-02-24 MED ORDER — FLUTICASONE PROPIONATE HFA 110 MCG/ACT IN AERO: 2.0000 | INHALATION_SPRAY | Freq: Two times a day (BID) | RESPIRATORY_TRACT | Status: DC

## 2021-02-24 MED ORDER — PANTOPRAZOLE SODIUM 40 MG PO TBEC
40.0000 mg | DELAYED_RELEASE_TABLET | Freq: Every day | ORAL | Status: DC
  Administered 2021-02-24 – 2021-02-27 (×4): 40 mg via ORAL
  Filled 2021-02-24 (×3): qty 1

## 2021-02-24 MED ORDER — BENZONATATE 100 MG PO CAPS
200.0000 mg | ORAL_CAPSULE | Freq: Three times a day (TID) | ORAL | Status: DC
  Administered 2021-02-24 – 2021-02-27 (×8): 200 mg via ORAL
  Filled 2021-02-24 (×8): qty 2

## 2021-02-24 MED ORDER — ACETAMINOPHEN 500 MG PO TABS: 1000.0000 mg | ORAL_TABLET | ORAL | Status: DC | PRN

## 2021-02-24 NOTE — ED Notes (Signed)
Patient transported to CT 

## 2021-02-24 NOTE — ED Provider Notes (Signed)
[facility] EMERGENCY DEPARTMENT Provider Note   CSN: [id number] Arrival date & time: 02/24/21  1013     History Chief Complaint  Patient presents with  . Shortness of Breath    Corey [name] is a 75 y.o. male.  Patient with hx prostate ca, presents with general weakness, and progress sob in past 1-2 months. Symptoms gradual onset, moderate, constant, persistent, slowly getting worse. Pt had attributed his symptoms to his eligard therapy, last injection in Dec 2021, and due next month. States had seen his doctor w same, and felt possibly related.  Denies any focal numbness or weakness. No headache. No chest pain. Occasional non prod cough. No sore throat or runny nose. Slowly progressive sob and doe. No acute or abrupt sob. No pleuritic pain. Also notes mid to upper abd pain/fullness in past few months - no acute change today.  Poor appetite. No vomiting or diarrhea. No dysuria or gu c/o. No fever or chills. Remote hx smoking but quit 20 yrs ago. No hx asthma, copd or chf. Hx afib, on xarelto, compliant w therapy.  Denies recent palpitations. No leg pain or swelling. No recent blood loss, rectal bleeding or melena.   The history is provided by the patient.  Shortness of Breath Associated symptoms: abdominal pain and cough   Associated symptoms: no chest pain, no fever, no headaches, no neck pain, no rash, no sore throat and no vomiting        Past Medical History:  Diagnosis Date  . Arthritis    "back, right knee, hands, ankles, neck" (05/31/2016)  . Carotid artery disease ([place]) 08/30/2016   Carotid US 11/19: R 1-39, L 40-59 // Carotid US 08/2019: R 1-39; L 40-59 // Carotid US 11/21: R 1-39; L 40-59>> repeat 1 year   . Chronic lower back pain   . Coronary atherosclerosis of native coronary artery    a. BMS to [facility] 2004 and 2007, otherwise mild nonobstructive disease. EF normal.  . Diverticulitis   . Dyslipidemia   . Essential hypertension, benign   . GERD  (gastroesophageal reflux disease)   . Lumbar radiculopathy, chronic 02/04/2015   Right L5  . Obesity   . OSA on CPAP   . Paroxysmal atrial fibrillation ([place])    a. Discovered after stroke.  . Pneumonia 01/1996  . PONV (postoperative nausea and vomiting)   . Prostate CA ([place])   . Recurrent upper respiratory infection (URI)   . Visit for monitoring Tikosyn therapy 09/18/2019    Patient Active Problem List   Diagnosis Date Noted  . Reactive airway disease 11/20/2020  . Allergic rhinitis with non-allergic component 09/18/2020  . Heartburn 09/18/2020  . Coughing 09/18/2020  . Malignant neoplasm of prostate ([place]) 03/04/2020  . Pre-syncope 02/27/2020  . Cerebral embolism with transient ischemic attack (TIA) 02/11/2020  . History of stroke 02/11/2020  . Embolic stroke involving cerebral artery ([place]) 02/11/2020  . Secondary hypercoagulable state ([place]) 09/10/2019  . Essential tremor 06/25/2019  . Chest pain 05/27/2017  . Demand ischemia ([place]) 05/27/2017  . Morbid obesity ([place]) 05/27/2017  . Carotid artery disease ([place]) 08/30/2016  . Essential hypertension 08/30/2016  . Diverticulitis of intestine without perforation or abscess without bleeding   . Hyponatremia 06/04/2016  . Diverticulitis 05/31/2016  . Iron deficiency 01/05/2016  . Lumbar radiculopathy, chronic 02/04/2015  . SVT (supraventricular tachycardia) ([place]) 12/03/2014  . PAC (premature atrial contraction) 04/05/2014  . OSA on CPAP 03/27/2014  . AKI (acute kidney injury) ([place]) 12/05/2013  .  Nausea 12/05/2013  . Sepsis ([place]) 12/05/2013  . Abdominal pain 12/05/2013  . Atrial fibrillation ([place]) 01/05/2013  . CVA (cerebral infarction) 07/06/2012  . Coronary artery disease   . Dyslipidemia   . GERD (gastroesophageal reflux disease)     Past Surgical History:  Procedure Laterality Date  . ADENOIDECTOMY    . ANTERIOR CERVICAL DECOMP/DISCECTOMY FUSION  07/2001; 10/2002   "C5-6; C6-7; redo"  . BACK SURGERY    . CARPAL TUNNEL  RELEASE Left 10/2015  . COLONOSCOPY W/ POLYPECTOMY  02/2014  . CORONARY ANGIOPLASTY WITH STENT PLACEMENT  05/2003; 12/2005   "mid RCA; mid RCA"  . HAND SURGERY  02/2019   LEFT HAND  . implantable loop recorder placement  02/27/2020   Med[employer]onic Reveal [place] model [id number]  implantable loop recorder implanted by Dr Rayann Heman for afib management and evaluation of presyncope  . JOINT REPLACEMENT    . KNEE ARTHROSCOPY Left 10/2005  . KNEE ARTHROSCOPY W/ PARTIAL MEDIAL MENISCECTOMY Left 09/2005  . LUMBAR LAMINECTOMY/DECOMPRESSION MICRODISCECTOMY  03/2005   "L4-5"  . POSTERIOR LUMBAR FUSION  10/2003   L5-S1; "plates, screws"  . SHOULDER ARTHROSCOPY Right 08/2011   Debridement of labrum, arthroscopic distal clavicle excision  . SHOULDER OPEN ROTATOR CUFF REPAIR Left 07/2014  . TEE WITHOUT CARDIOVERSION  07/07/2012   Procedure: TRANSESOPHAGEAL ECHOCARDIOGRAM (TEE);  Surgeon: Fay Records, MD;  Location: [facility];  Service: Cardiovascular;  Laterality: N/A;  . TONSILLECTOMY AND ADENOIDECTOMY  ~ 1956  . TOTAL KNEE ARTHROPLASTY Left 10/2006  . TRIGGER FINGER RELEASE Left 10/2015       Family History  Problem Relation Age of Onset  . Hypertension Mother   . Heart disease Father   . Heart attack Father   . Prostate cancer Brother   . Parkinson's disease Brother   . Blindness Son   . Breast cancer Neg Hx   . Colon cancer Neg Hx   . Pancreatic cancer Neg Hx     Social History   Tobacco Use  . Smoking status: Former Smoker    Packs/day: 1.00    Years: 34.00    Pack years: 34.00    Types: Cigarettes    Quit date: 05/05/2003    Years since quitting: 17.8  . Smokeless tobacco: Never Used  Vaping Use  . Vaping Use: Never used  Substance Use Topics  . Alcohol use: No    Alcohol/week: 0.0 standard drinks  . Drug use: No    Home Medications Prior to Admission medications   Medication Sig Start Date End Date Taking? Authorizing Provider  acetaminophen (TYLENOL 8 HOUR ARTHRITIS  PAIN) 650 MG CR tablet Take 1,300 mg by mouth as needed for pain (pain).     [provider]  albuterol (VENTOLIN HFA) 108 (90 Base) MCG/ACT inhaler Inhale 2 puffs into the lungs every 4 hours as needed 01/08/21   Garnet Sierras, DO  atorvastatin (LIPITOR) 40 MG tablet Take 1 tablet (40 mg total) by mouth daily. 01/21/21   Nahser, Wonda Cheng, MD  azelastine (ASTELIN) 0.1 % nasal spray Place 2 sprays into both nostrils 2 (two) times daily. 09/18/20   Garnet Sierras, DO  clindamycin (CLEOCIN) 300 MG capsule Take 300 mg by mouth. Uses for dental procedures    [provider]  dicyclomine (BENTYL) 20 MG tablet Take 1 tablet by mouth as needed.    [provider]  diltiazem (CARDIZEM) 30 MG tablet Take 1 tablet (30 mg total) by mouth 4 (four)  times daily as needed. 01/21/21   Nahser, Wonda Cheng, MD  dofetilide (TIKOSYN) 500 MCG capsule Take 1 capsule (500 mcg total) by mouth 2 (two) times daily. Please keep upcoming appointment in February for future refills. Thank you 01/21/21   Nahser, Wonda Cheng, MD  ergocalciferol (VITAMIN D2) 1.25 MG (50000 UT) capsule Take 1 capsule (50,000 Units total) by mouth once a week. 10/10/20   Brunetta Genera, MD  fenofibrate 160 MG tablet TAKE 1 TABLET DAILY. PLEASE KEEP UPCOMING APPT IN MARCH WITH DR. Acie Fredrickson BEFORE ANYMORE REFILLS. 01/21/21   Nahser, Wonda Cheng, MD  fluticasone (FLONASE) 50 MCG/ACT nasal spray Place 2 sprays into both nostrils daily. 09/18/20   Garnet Sierras, DO  fluticasone (FLOVENT HFA) 110 MCG/ACT inhaler Inhale 2 puffs into the lungs in the morning and at bedtime. with spacer and rinse mouth afterwards. 12/12/20   Garnet Sierras, DO  leuprolide, 6 Month, (ELIGARD) 45 MG injection Inject 45 mg into the skin every 6 (six) months.    [provider]  levocetirizine (XYZAL) 5 MG tablet Take 5 mg by mouth every evening.    [provider]  loperamide (IMODIUM) 2 MG capsule Take 2 mg by mouth as needed for diarrhea or loose stools.     [provider]  magnesium oxide (MAG-OX) 400 MG tablet TAKE 1 TABLET BY MOUTH EVERY DAY 09/10/20   Allred, Jeneen Rinks, MD  metoprolol succinate (TOPROL XL) 25 MG 24 hr tablet Take 1 tablet (25 mg total) by mouth daily. 08/04/20   Thompson Grayer, MD  Multiple Vitamins-Minerals (MULTIVITAMINS THER. W/MINERALS) TABS Take 1 tablet by mouth daily.    [provider]  nitroGLYCERIN (NITROSTAT) 0.4 MG SL tablet Place 1 tablet (0.4 mg total) under the tongue every 5 (five) minutes as needed for chest pain. 01/21/21   Nahser, Wonda Cheng, MD  omeprazole (PRILOSEC) 10 MG capsule TAKE 1 CAPSULE DAILY. 01/21/21   Nahser, Wonda Cheng, MD  oxybutynin (DITROPAN) 5 MG tablet Take 1 tablet by mouth 2 (two) times daily as needed.    [provider]  potassium chloride (KLOR-CON) 10 MEQ tablet Take 1 tablet (10 mEq total) by mouth daily. 10/23/20   Nahser, Wonda Cheng, MD  Saccharomyces boulardii (PROBIOTIC) 250 MG CAPS Take 1 capsule by mouth daily.    [provider]  tamsulosin (FLOMAX) 0.4 MG CAPS capsule Take 0.4 mg by mouth daily.    [provider]  topiramate (TOPAMAX) 50 MG tablet Take 2 tablets (100 mg total) by mouth 2 (two) times daily. 01/16/20   Frann Rider, NP  traZODone (DESYREL) 50 MG tablet Take 0.5 tablets (25 mg total) by mouth at bedtime as needed for sleep. 09/22/20   Frann Rider, NP  XARELTO 20 MG TABS tablet TAKE 1 TABLET BY MOUTH EVERY DAY 10/06/20   Nahser, Wonda Cheng, MD    Allergies    Amoxicillin, Fosinopril, Hydromorphone, Ampicillin, Codeine, Monopril [fosinopril sodium], and Percocet [oxycodone-acetaminophen]  Review of Systems   Review of Systems  Constitutional: Negative for chills and fever.  HENT: Negative for sore throat.   Eyes: Negative for redness.  Respiratory: Positive for cough and shortness of breath.   Cardiovascular: Negative for chest pain, palpitations and leg swelling.  Gastrointestinal: Positive for abdominal pain. Negative for  diarrhea and vomiting.  Endocrine: Negative for polyuria.  Genitourinary: Negative for dysuria and flank pain.  Musculoskeletal: Negative for back pain and neck pain.  Skin: Negative for rash.  Neurological: Negative for numbness  and headaches.  Hematological: Does not bruise/bleed easily.  Psychiatric/Behavioral: Negative for confusion.    Physical Exam Updated Vital Signs BP 123/82 (BP Location: Left Arm)   Pulse 86   Temp 98.9 F (37.2 C)   Resp 16   Ht 1.854 m (6\' 1" )   Wt 118.8 kg   SpO2 97%   BMI 34.57 kg/m   Physical Exam Vitals and nursing note reviewed.  Constitutional:      Appearance: Normal appearance. He is well-developed.  HENT:     Head: Atraumatic.     Nose: Nose normal.     Mouth/Throat:     Mouth: Mucous membranes are moist.     Pharynx: Oropharynx is clear.  Eyes:     General: No scleral icterus.    Conjunctiva/sclera: Conjunctivae normal.  Neck:     Vascular: No carotid bruit.     Trachea: No tracheal deviation.     Comments: Thyroid not grossly enlarged or tender.  Cardiovascular:     Rate and Rhythm: Normal rate and regular rhythm.     Pulses: Normal pulses.     Heart sounds: Normal heart sounds. No murmur heard. No friction rub. No gallop.   Pulmonary:     Effort: Pulmonary effort is normal. No accessory muscle usage or respiratory distress.     Breath sounds: No wheezing.     Comments: Decreased bs left mid to lower lung fields.  Abdominal:     General: Bowel sounds are normal. There is no distension.     Palpations: Abdomen is soft.     Tenderness: There is abdominal tenderness. There is no guarding.     Comments: Upper abd tenderness, no rebound or guarding.   Genitourinary:    Comments: No cva tenderness. Musculoskeletal:        General: No swelling or tenderness.     Cervical back: Normal range of motion and neck supple. No rigidity.     Right lower leg: No edema.     Left lower leg: No edema.  Skin:    General: Skin is warm  and dry.     Findings: No rash.  Neurological:     Mental Status: He is alert.     Comments: Alert, speech clear. Steady gait.   Psychiatric:        Mood and Affect: Mood normal.     ED Results / Procedures / Treatments   Labs (all labs ordered are listed, but only abnormal results are displayed) Results for orders placed or performed during the hospital encounter of 02/24/21  Resp Panel by RT-PCR (Flu A&B, Covid) Nasopharyngeal Swab   Specimen: Nasopharyngeal Swab; Nasopharyngeal(NP) swabs in vial transport medium  Result Value Ref Range   SARS Coronavirus 2 by RT PCR NEGATIVE NEGATIVE   Influenza A by PCR NEGATIVE NEGATIVE   Influenza B by PCR NEGATIVE NEGATIVE  Basic metabolic panel  Result Value Ref Range   Sodium 136 135 - 145 mmol/L   Potassium 4.0 3.5 - 5.1 mmol/L   Chloride 104 98 - 111 mmol/L   CO2 23 22 - 32 mmol/L   Glucose, Bld 119 (H) 70 - 99 mg/dL   BUN 16 8 - 23 mg/dL   Creatinine, Ser 1.03 0.61 - 1.24 mg/dL   Calcium 9.4 8.9 - 10.3 mg/dL   GFR, Estimated >60 >60 mL/min   Anion gap 9 5 - 15  CBC  Result Value Ref Range   WBC 5.5 4.0 - 10.5 K/uL  RBC 4.09 (L) 4.22 - 5.81 MIL/uL   Hemoglobin 12.1 (L) 13.0 - 17.0 g/dL   HCT 39.0 39.0 - 52.0 %   MCV 95.4 80.0 - 100.0 fL   MCH 29.6 26.0 - 34.0 pg   MCHC 31.0 30.0 - 36.0 g/dL   RDW 15.8 (H) 11.5 - 15.5 %   Platelets 326 150 - 400 K/uL   nRBC 0.0 0.0 - 0.2 %  Troponin I (High Sensitivity)  Result Value Ref Range   Troponin I (High Sensitivity) 7 <18 ng/L  Troponin I (High Sensitivity)  Result Value Ref Range   Troponin I (High Sensitivity) 9 <18 ng/L   DG Chest 2 View  Result Date: 02/24/2021 CLINICAL DATA:  Chest pain, shortness of breath. EXAM: CHEST - 2 VIEW COMPARISON:  September 04, 2020. FINDINGS: Stable cardiomegaly. No pneumothorax is noted. Right lung is clear. Interval development of moderate to large left pleural effusion is noted with associated left basilar atelectasis. Bony thorax is  unremarkable. IMPRESSION: Interval development of moderate to large left pleural effusion with associated left basilar atelectasis. Electronically Signed   By: Marijo Conception M.D.   On: 02/24/2021 11:04    EKG EKG Interpretation  Date/Time:  Tuesday Feb 24 2021 10:17:52 EDT Ventricular Rate:  85 PR Interval:  174 QRS Duration: 88 QT Interval:  396 QTC Calculation: 471 R Axis:   9 Text Interpretation: Normal sinus rhythm Confirmed by Lajean Saver [phone]) on 02/24/2021 1:22:12 PM   Radiology DG Chest 2 View  Result Date: 02/24/2021 CLINICAL DATA:  Chest pain, shortness of breath. EXAM: CHEST - 2 VIEW COMPARISON:  September 04, 2020. FINDINGS: Stable cardiomegaly. No pneumothorax is noted. Right lung is clear. Interval development of moderate to large left pleural effusion is noted with associated left basilar atelectasis. Bony thorax is unremarkable. IMPRESSION: Interval development of moderate to large left pleural effusion with associated left basilar atelectasis. Electronically Signed   By: Marijo Conception M.D.   On: 02/24/2021 11:04   CT CHEST ABDOMEN PELVIS WO CONTRAST  Result Date: 02/24/2021 CLINICAL DATA:  LEFT side chest and abdominal pain with nausea. History of coronary artery disease post coronary PTCA, hypertension, GERD, atrial fibrillation, prostate cancer, former smoker EXAM: CT CHEST, ABDOMEN AND PELVIS WITHOUT CONTRAST TECHNIQUE: Multidetector CT imaging of the chest, abdomen and pelvis was performed following the standard protocol without IV contrast. Sagittal and coronal MPR images reconstructed from axial data set. COMPARISON:  CT abdomen and pelvis 02/18/2020, CT chest 12/10/2006 FINDINGS: CT CHEST FINDINGS Cardiovascular: Atherosclerotic calcifications aorta, coronary arteries and proximal great vessels. Aneurysmal dilatation ascending thoracic aorta 4.1 cm transverse image 28 previously 3.9 cm. Heart unremarkable. No pericardial effusion. Mediastinum/Nodes: Esophagus  unremarkable. New enlarged azygoesophageal recess lymph nodes measuring 19 mm image 37 and 19 mm image 35. Normal sized paratracheal nodes. 11 mm AP window node image 25 new. Base of cervical region unremarkable. Lungs/Pleura: Large LEFT pleural effusion. Suspected LEFT infrahilar mass extending into medial LEFT lower lobe with narrowing of LEFT lower lobe bronchi, ill-defined due to adjacent subtotal atelectasis in LEFT lower lobe with partial atelectasis of LEFT upper lobe. Minimal interstitial prominence at RIGHT lung base. No acute infiltrate or pneumothorax. Few small subpleural nodular foci in LEFT upper lobe, 5 mm image 69, 4 mm image 72, 5 mm image 79. Questionable 11 mm subpleural nodule in lingula image 89. Musculoskeletal: No acute osseous findings. CT ABDOMEN PELVIS FINDINGS Hepatobiliary: Gallbladder and liver normal appearance Pancreas: Normal appearance Spleen: Normal appearance  Adrenals/Urinary Tract: Adrenal glands, kidneys, ureters, and bladder normal appearance Stomach/Bowel: Sigmoid diverticulosis without evidence of diverticulitis. Stomach decompressed. Normal appendix. Vascular/Lymphatic: Atherosclerotic calcifications of aorta and iliac arteries. Short segment of displaced atherosclerotic calcification at the mid to distal abdominal aorta, previously assessed by CT angio exam, felt to represent irregular plaque rather than aneurysm or penetrating ulcer, please refer to prior study. Aorta normal caliber. No adenopathy. Reproductive: Few metallic clips at the prostate gland, which is otherwise unremarkable. Seminal vesicles normal appearance. Other: No free air or free fluid. Small RIGHT inguinal hernia containing fat. Musculoskeletal: No acute osseous findings. IMPRESSION: New large LEFT pleural effusion with subtotal atelectasis of LEFT lower lobe and partial atelectasis of LEFT upper lobe. Few small subpleural nodular foci in LEFT lung, largest 11 mm, nonspecific, new since 2008. New  azygoesophageal recess adenopathy. Suspicion of a LEFT infrahilar mass extending into LEFT lower lobe medially with narrowing of LEFT lower lobe bronchi, concerning for LEFT infrahilar neoplasm. Sigmoid diverticulosis without evidence of diverticulitis. Small RIGHT inguinal hernia containing fat. No acute intra-abdominal or intrapelvic abnormalities. Aortic Atherosclerosis (ICD10-I70.0). Electronically Signed   By: Lavonia Dana M.D.   On: 02/24/2021 14:37    Procedures Procedures   Medications Ordered in ED Medications - No data to display  ED Course  I have reviewed the triage vital signs and the nursing notes.  Pertinent labs & imaging results that were available during my care of the patient were reviewed by me and considered in my medical decision making (see chart for details).    MDM Rules/Calculators/A&P                          Labs sent. Cxr.   Reviewed nursing notes and prior charts for additional history.   CXR reviewed/interpreted by me - left effusion/new.   Labs reviewed/interpreted by me - trop normal, wbc and hgb normal.   CT reviewed/interpreted by me - large effusion ?hilar mass.   Given progressive dyspnea, will admit. Pt may benefit from diagnostic and therapeutic thoracentesis.   Hospitalists consulted for admission.    Final Clinical Impression(s) / ED Diagnoses Final diagnoses:  None    Rx / DC Orders ED Discharge Orders    None       Lajean Saver, MD 02/24/21 1520

## 2021-02-24 NOTE — ED Triage Notes (Signed)
Pt reports SOB since December, states progressively worse since getting a new injection/medication. Pt also c/o abdominal pain/swelling. Pt speaking in full sentences, NAD at present.

## 2021-02-24 NOTE — H&P (Signed)
History and Physical    Corey [name] WGN:[id number] DOB: 1945-11-16 DOA: 02/24/2021  PCP: [name], MD (Confirm with patient/family/NH records and if not entered, this has to be entered at [facility] point of entry) Patient coming from: Home  I have personally briefly reviewed patient's old medical records in [place]  Chief Complaint: Cough,  SOB  HPI: Corey [name] is a 75 y.o. male with medical history significant of prostate cancer stage T2C, status post radiation therapy, paroxysmal A. 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  Symptoms started 2 to 3 months ago, gradually getting worse.  Initially with mild exertional dyspnea with some left-sided chest discomfort, cannot take deep breath.  Productive cough with some thin clear phlegm, no blood.  Few weeks ago, symptoms worsened with more constant cough and wheezing, went to see allergist who diagnosed him with asthma started him on as needed albuterol, which he tried with some relief.  However, breathing symptoms continued to get worse, no walking short distances within the house can cause severe shortness of breath.  And the left-sided chest pain " becomes more frequent and across to the right side" denies any fever chills, he estimated he lost few pounds recently and with constantly feeling fatigue.  Was diagnosed with prostate cancer June last year, underwent radiation therapy for 4 rounds ended December 2021, underwent PET scan last year which showed no metastatic sites. ED Course: X-ray showed left-sided pleural effusion, CT chest without contrast showed large left-sided pleural effusion with questionable left infrahilar mass extending to the left lower lobe.  Review of Systems: As per HPI otherwise 14 point review of systems negative.    Past Medical History:  Diagnosis Date  . Arthritis    "back, right knee, hands, ankles, neck" (05/31/2016)  .  Carotid artery disease ([place]) 08/30/2016   Carotid US 11/19: R 1-39, L 40-59 // Carotid US 08/2019: R 1-39; L 40-59 // Carotid US 11/21: R 1-39; L 40-59>> repeat 1 year   . Chronic lower back pain   . Coronary atherosclerosis of native coronary artery    a. BMS to [facility] 2004 and 2007, otherwise mild nonobstructive disease. EF normal.  . Diverticulitis   . Dyslipidemia   . Essential hypertension, benign   . GERD (gastroesophageal reflux disease)   . Lumbar radiculopathy, chronic 02/04/2015   Right L5  . Obesity   . OSA on CPAP   . Paroxysmal atrial fibrillation ([place])    a. Discovered after stroke.  . Pneumonia 01/1996  . PONV (postoperative nausea and vomiting)   . Prostate CA ([place])   . Recurrent upper respiratory infection (URI)   . Visit for monitoring Tikosyn therapy 09/18/2019    Past Surgical History:  Procedure Laterality Date  . ADENOIDECTOMY    . ANTERIOR CERVICAL DECOMP/DISCECTOMY FUSION  07/2001; 10/2002   "C5-6; C6-7; redo"  . BACK SURGERY    . CARPAL TUNNEL RELEASE Left 10/2015  . COLONOSCOPY W/ POLYPECTOMY  02/2014  . CORONARY ANGIOPLASTY WITH STENT PLACEMENT  05/2003; 12/2005   "mid RCA; mid RCA"  . HAND SURGERY  02/2019   LEFT HAND  . implantable loop recorder placement  02/27/2020   [employer] Reveal Metaline model [id number]  implantable loop recorder implanted by Dr Rayann Heman for afib management and evaluation of presyncope  . JOINT REPLACEMENT    . KNEE ARTHROSCOPY Left 10/2005  . KNEE ARTHROSCOPY W/ PARTIAL MEDIAL MENISCECTOMY Left 09/2005  .  LUMBAR LAMINECTOMY/DECOMPRESSION MICRODISCECTOMY  03/2005   "L4-5"  . POSTERIOR LUMBAR FUSION  10/2003   L5-S1; "plates, screws"  . SHOULDER ARTHROSCOPY Right 08/2011   Debridement of labrum, arthroscopic distal clavicle excision  . SHOULDER OPEN ROTATOR CUFF REPAIR Left 07/2014  . TEE WITHOUT CARDIOVERSION  07/07/2012   Procedure: TRANSESOPHAGEAL ECHOCARDIOGRAM (TEE);  Surgeon: Fay Records, MD;  Location: [place];   Service: Cardiovascular;  Laterality: N/A;  . TONSILLECTOMY AND ADENOIDECTOMY  ~ 1956  . TOTAL KNEE ARTHROPLASTY Left 10/2006  . TRIGGER FINGER RELEASE Left 10/2015     reports that he quit smoking about 17 years ago. His smoking use included cigarettes. He has a 34.00 pack-year smoking history. He has never used smokeless tobacco. He reports that he does not drink alcohol and does not use drugs.  Allergies  Allergen Reactions  . Amoxicillin Swelling  . Fosinopril Other (See Comments)  . Hydromorphone Other (See Comments) and Nausea Only  . Ampicillin Rash  . Codeine Nausea Only and Other (See Comments)    Heavy amounts cause nausea  . Monopril [Fosinopril Sodium] Other (See Comments)    Muscle aches & pain  . Percocet [Oxycodone-Acetaminophen] Nausea And Vomiting    Family History  Problem Relation Age of Onset  . Hypertension Mother   . Heart disease Father   . Heart attack Father   . Prostate cancer Brother   . Parkinson's disease Brother   . Blindness Son   . Breast cancer Neg Hx   . Colon cancer Neg Hx   . Pancreatic cancer Neg Hx      Prior to Admission medications   Medication Sig Start Date End Date Taking? Authorizing Provider  acetaminophen (TYLENOL 8 HOUR ARTHRITIS PAIN) 650 MG CR tablet Take 1,300 mg by mouth as needed for pain (pain).     [provider]  albuterol (VENTOLIN HFA) 108 (90 Base) MCG/ACT inhaler Inhale 2 puffs into the lungs every 4 hours as needed 01/08/21   Garnet Sierras, DO  atorvastatin (LIPITOR) 40 MG tablet Take 1 tablet (40 mg total) by mouth daily. 01/21/21   Nahser, Wonda Cheng, MD  azelastine (ASTELIN) 0.1 % nasal spray Place 2 sprays into both nostrils 2 (two) times daily. 09/18/20   Garnet Sierras, DO  clindamycin (CLEOCIN) 300 MG capsule Take 300 mg by mouth. Uses for dental procedures    [provider]  dicyclomine (BENTYL) 20 MG tablet Take 1 tablet by mouth as needed.    [provider]  diltiazem (CARDIZEM) 30 MG  tablet Take 1 tablet (30 mg total) by mouth 4 (four) times daily as needed. 01/21/21   Nahser, Wonda Cheng, MD  dofetilide (TIKOSYN) 500 MCG capsule Take 1 capsule (500 mcg total) by mouth 2 (two) times daily. Please keep upcoming appointment in February for future refills. Thank you 01/21/21   Nahser, Wonda Cheng, MD  ergocalciferol (VITAMIN D2) 1.25 MG (50000 UT) capsule Take 1 capsule (50,000 Units total) by mouth once a week. 10/10/20   Brunetta Genera, MD  fenofibrate 160 MG tablet TAKE 1 TABLET DAILY. PLEASE KEEP UPCOMING APPT IN MARCH WITH DR. Acie Fredrickson BEFORE ANYMORE REFILLS. 01/21/21   Nahser, Wonda Cheng, MD  fluticasone (FLONASE) 50 MCG/ACT nasal spray Place 2 sprays into both nostrils daily. 09/18/20   Garnet Sierras, DO  fluticasone (FLOVENT HFA) 110 MCG/ACT inhaler Inhale 2 puffs into the lungs in the morning and at bedtime. with spacer and rinse mouth afterwards. 12/12/20  Garnet Sierras, DO  leuprolide, 6 Month, (ELIGARD) 45 MG injection Inject 45 mg into the skin every 6 (six) months.    [provider]  levocetirizine (XYZAL) 5 MG tablet Take 5 mg by mouth every evening.    [provider]  loperamide (IMODIUM) 2 MG capsule Take 2 mg by mouth as needed for diarrhea or loose stools.    [provider]  magnesium oxide (MAG-OX) 400 MG tablet TAKE 1 TABLET BY MOUTH EVERY DAY 09/10/20   Allred, Jeneen Rinks, MD  metoprolol succinate (TOPROL XL) 25 MG 24 hr tablet Take 1 tablet (25 mg total) by mouth daily. 08/04/20   Thompson Grayer, MD  Multiple Vitamins-Minerals (MULTIVITAMINS THER. W/MINERALS) TABS Take 1 tablet by mouth daily.    [provider]  nitroGLYCERIN (NITROSTAT) 0.4 MG SL tablet Place 1 tablet (0.4 mg total) under the tongue every 5 (five) minutes as needed for chest pain. 01/21/21   Nahser, Wonda Cheng, MD  omeprazole (PRILOSEC) 10 MG capsule TAKE 1 CAPSULE DAILY. 01/21/21   Nahser, Wonda Cheng, MD  oxybutynin (DITROPAN) 5 MG tablet Take 1 tablet by mouth 2 (two) times daily  as needed.    [provider]  potassium chloride (KLOR-CON) 10 MEQ tablet Take 1 tablet (10 mEq total) by mouth daily. 10/23/20   Nahser, Wonda Cheng, MD  Saccharomyces boulardii (PROBIOTIC) 250 MG CAPS Take 1 capsule by mouth daily.    [provider]  tamsulosin (FLOMAX) 0.4 MG CAPS capsule Take 0.4 mg by mouth daily.    [provider]  topiramate (TOPAMAX) 50 MG tablet Take 2 tablets (100 mg total) by mouth 2 (two) times daily. 01/16/20   Frann Rider, NP  traZODone (DESYREL) 50 MG tablet Take 0.5 tablets (25 mg total) by mouth at bedtime as needed for sleep. 09/22/20   Frann Rider, NP  XARELTO 20 MG TABS tablet TAKE 1 TABLET BY MOUTH EVERY DAY 10/06/20   Nahser, Wonda Cheng, MD    Physical Exam: Vitals:   02/24/21 1216 02/24/21 1405 02/24/21 1509 02/24/21 1600  BP: 123/82 126/73 123/82 (!) 150/109  Pulse: 86 (S) 75 77 93  Resp: 16 18 18 20   Temp:   98 F (36.7 C)   TempSrc:   Oral   SpO2: 97% 96% 96% 95%  Weight:      Height:        Constitutional: NAD, calm, comfortable Vitals:   02/24/21 1216 02/24/21 1405 02/24/21 1509 02/24/21 1600  BP: 123/82 126/73 123/82 (!) 150/109  Pulse: 86 (S) 75 77 93  Resp: 16 18 18 20   Temp:   98 F (36.7 C)   TempSrc:   Oral   SpO2: 97% 96% 96% 95%  Weight:      Height:       Eyes: PERRL, lids and conjunctivae normal ENMT: Mucous membranes are moist. Posterior pharynx clear of any exudate or lesions.Normal dentition.  Neck: normal, supple, no masses, no thyromegaly Respiratory: clear to auscultation bilaterally, scattered wheezing on right side of lung and diminished breathing sound left, no crackles.  Increasing respiratory effort. No accessory muscle use.  Cardiovascular: Regular rate and rhythm, no murmurs / rubs / gallops. No extremity edema. 2+ pedal pulses. No carotid bruits.  Abdomen: no tenderness, no masses palpated. No hepatosplenomegaly. Bowel sounds positive.  Musculoskeletal: no clubbing / cyanosis. No  joint deformity upper and lower extremities. Good ROM, no contractures. Normal muscle tone.  Skin: no rashes, lesions, ulcers. No induration Neurologic: CN  2-12 grossly intact. Sensation intact, DTR normal. Strength 5/5 in all 4.  Psychiatric: Normal judgment and insight. Alert and oriented x 3. Normal mood.     Labs on Admission: I have personally reviewed following labs and imaging studies  CBC: Recent Labs  Lab 02/24/21 1020  WBC 5.5  HGB 12.1*  HCT 39.0  MCV 95.4  PLT 258   Basic Metabolic Panel: Recent Labs  Lab 02/24/21 1020  NA 136  K 4.0  CL 104  CO2 23  GLUCOSE 119*  BUN 16  CREATININE 1.03  CALCIUM 9.4   GFR: Estimated Creatinine Clearance: 86.3 mL/min (by C-G formula based on SCr of 1.03 mg/dL). Liver Function Tests: No results for input(s): AST, ALT, ALKPHOS, BILITOT, PROT, ALBUMIN in the last 168 hours. No results for input(s): LIPASE, AMYLASE in the last 168 hours. No results for input(s): AMMONIA in the last 168 hours. Coagulation Profile: No results for input(s): INR, PROTIME in the last 168 hours. Cardiac Enzymes: No results for input(s): CKTOTAL, CKMB, CKMBINDEX, TROPONINI in the last 168 hours. BNP (last 3 results) No results for input(s): PROBNP in the last 8760 hours. HbA1C: No results for input(s): HGBA1C in the last 72 hours. CBG: No results for input(s): GLUCAP in the last 168 hours. Lipid Profile: No results for input(s): CHOL, HDL, LDLCALC, TRIG, CHOLHDL, LDLDIRECT in the last 72 hours. Thyroid Function Tests: No results for input(s): TSH, T4TOTAL, FREET4, T3FREE, THYROIDAB in the last 72 hours. Anemia Panel: No results for input(s): VITAMINB12, FOLATE, FERRITIN, TIBC, IRON, RETICCTPCT in the last 72 hours. Urine analysis:    Component Value Date/Time   COLORURINE AMBER (A) 06/04/2016 1304   APPEARANCEUR CLEAR 06/04/2016 1304   LABSPEC 1.019 06/04/2016 1304   PHURINE 5.5 06/04/2016 1304   GLUCOSEU NEGATIVE 06/04/2016 1304   HGBUR  NEGATIVE 06/04/2016 1304   BILIRUBINUR NEGATIVE 06/04/2016 1304   KETONESUR NEGATIVE 06/04/2016 1304   PROTEINUR NEGATIVE 06/04/2016 1304   NITRITE NEGATIVE 06/04/2016 1304   LEUKOCYTESUR SMALL (A) 06/04/2016 1304    Radiological Exams on Admission: DG Chest 2 View  Result Date: 02/24/2021 CLINICAL DATA:  Chest pain, shortness of breath. EXAM: CHEST - 2 VIEW COMPARISON:  September 04, 2020. FINDINGS: Stable cardiomegaly. No pneumothorax is noted. Right lung is clear. Interval development of moderate to large left pleural effusion is noted with associated left basilar atelectasis. Bony thorax is unremarkable. IMPRESSION: Interval development of moderate to large left pleural effusion with associated left basilar atelectasis. Electronically Signed   By: Marijo Conception M.D.   On: 02/24/2021 11:04   CT CHEST ABDOMEN PELVIS WO CONTRAST  Result Date: 02/24/2021 CLINICAL DATA:  LEFT side chest and abdominal pain with nausea. History of coronary artery disease post coronary PTCA, hypertension, GERD, atrial fibrillation, prostate cancer, former smoker EXAM: CT CHEST, ABDOMEN AND PELVIS WITHOUT CONTRAST TECHNIQUE: Multidetector CT imaging of the chest, abdomen and pelvis was performed following the standard protocol without IV contrast. Sagittal and coronal MPR images reconstructed from axial data set. COMPARISON:  CT abdomen and pelvis 02/18/2020, CT chest 12/10/2006 FINDINGS: CT CHEST FINDINGS Cardiovascular: Atherosclerotic calcifications aorta, coronary arteries and proximal great vessels. Aneurysmal dilatation ascending thoracic aorta 4.1 cm transverse image 28 previously 3.9 cm. Heart unremarkable. No pericardial effusion. Mediastinum/Nodes: Esophagus unremarkable. New enlarged azygoesophageal recess lymph nodes measuring 19 mm image 37 and 19 mm image 35. Normal sized paratracheal nodes. 11 mm AP window node image 25 new. Base of cervical region unremarkable. Lungs/Pleura: Large LEFT pleural effusion.  Suspected LEFT infrahilar mass extending into medial LEFT lower lobe with narrowing of LEFT lower lobe bronchi, ill-defined due to adjacent subtotal atelectasis in LEFT lower lobe with partial atelectasis of LEFT upper lobe. Minimal interstitial prominence at RIGHT lung base. No acute infiltrate or pneumothorax. Few small subpleural nodular foci in LEFT upper lobe, 5 mm image 69, 4 mm image 72, 5 mm image 79. Questionable 11 mm subpleural nodule in lingula image 89. Musculoskeletal: No acute osseous findings. CT ABDOMEN PELVIS FINDINGS Hepatobiliary: Gallbladder and liver normal appearance Pancreas: Normal appearance Spleen: Normal appearance Adrenals/Urinary Tract: Adrenal glands, kidneys, ureters, and bladder normal appearance Stomach/Bowel: Sigmoid diverticulosis without evidence of diverticulitis. Stomach decompressed. Normal appendix. Vascular/Lymphatic: Atherosclerotic calcifications of aorta and iliac arteries. Short segment of displaced atherosclerotic calcification at the mid to distal abdominal aorta, previously assessed by CT angio exam, felt to represent irregular plaque rather than aneurysm or penetrating ulcer, please refer to prior study. Aorta normal caliber. No adenopathy. Reproductive: Few metallic clips at the prostate gland, which is otherwise unremarkable. Seminal vesicles normal appearance. Other: No free air or free fluid. Small RIGHT inguinal hernia containing fat. Musculoskeletal: No acute osseous findings. IMPRESSION: New large LEFT pleural effusion with subtotal atelectasis of LEFT lower lobe and partial atelectasis of LEFT upper lobe. Few small subpleural nodular foci in LEFT lung, largest 11 mm, nonspecific, new since 2008. New azygoesophageal recess adenopathy. Suspicion of a LEFT infrahilar mass extending into LEFT lower lobe medially with narrowing of LEFT lower lobe bronchi, concerning for LEFT infrahilar neoplasm. Sigmoid diverticulosis without evidence of diverticulitis. Small  RIGHT inguinal hernia containing fat. No acute intra-abdominal or intrapelvic abnormalities. Aortic Atherosclerosis (ICD10-I70.0). Electronically Signed   By: Lavonia Dana M.D.   On: 02/24/2021 14:37    EKG: Independently reviewed.  Sinus, chronic rVR on V1, right axis deviation  Assessment/Plan Active Problems:   Pleural effusion  (please populate well all problems here in Problem List. (For example, if patient is on BP meds at home and you resume or decide to hold them, it is a problem that needs to be her. Same for CAD, COPD, HLD and so on)  Large left-sided pleural effusion -Suspect malignancy given CT started today showed left infrahilar mass extending to left lower lobe.  Ordered IR guided thoracentesis for tomorrow, cytology analysis.  Noted that patient had a normal PET scan last year upon diagnosed of prostate cancer. -Consult pulmonary for possible bronchoscopy -Hold Xarelto for today. -Low suspicious for CHF, LVEF 62% on stress test last year, no history of CAD.  Ordered echo.  PAF -Continue Tikosyn, beta-blocker and Cardizem, hold Xarelto for incoming thoracentesis.  Prostate CA status post radiation therapy -Outpatient follow-up with oncology  Asthma -Continue breathing treatment, add inhaled steroid.  OSA -Continue HS BIPAP   DVT prophylaxis: Lovenox, resume Xarelto after thoracentesis  code Status: Full code Family Communication: None at bedside Disposition Plan: Expect 2 to 3 days hospital stay, for thoracentesis and possible bronchoscopy and biopsy Consults called: Dr. Tamala Julian  Admission status: Telemetry admission   Lequita Halt MD [facility] Pager [phone]  02/24/2021, 4:25 PM

## 2021-02-24 NOTE — Consult Note (Signed)
NAME:  Corey [name], MRN:  [id number], DOB:  03-30-1946, LOS: 0 ADMISSION DATE:  02/24/2021, CONSULTATION DATE:  5/24 REFERRING MD:  Ashok Cordia MD, CHIEF COMPLAINT:  SOB   History of Present Illness:  Per patient exam and chart review-  Corey [name] is a 75 y.o. M with a PMX of prostate cancer (03/2020) s/p raditation thearpy, CAD with stent A.fib, HTN, OSA on CPAP, and 20 year smoking history,  who presented to the [facility] ED on 5/24 with complaints of shortness of breath.   Two to three months ago Corey [name] endorses SOB symptoms that gradually became worse. Endorses exertional dyspnea, coughing, expectorating clear/yellow sputum, SOB, night sweats (belives this is due to the eligard), slight dysphagia. Denies fever, chills, losing weight without trying, chest pain, hemoptysis, sick contacts, tb exposure, asbestos exposure. Currently retired. 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 Over the past few weeks his symptoms continued to worsen. He was started on albuterol which initially provided some relief.  He presented to the [facility] ED with SOB. A CT chest and CXR were obtained that were concerning for a left pleural effusion. PCCM was consulted for evaluation of the effusion. Patients last dose of xeralto was the evening of 5/23. CT scan was also concerning for a 25m subpleural nodule in the LUL. Dr. STamala Julianmet with the patient and discussed the imaging.    Pertinent  Medical History  prostate cancer (03/2020) s/p raditation thearpy, A.fib, HTN, 34 pack year smoking history, GERD, diverticulitis, OSA on CPAP, CAD with stent   Significant Hospital Events: Including procedures, antibiotic start and stop dates in addition to other pertinent events   . 5/24- Presented to ED with SOB. CT shows L effusion and 158msubpleural nodule in the LUL. Hospitalist to admit  Interim History / Subjective:  See above.  Objective   Blood pressure (!) 150/109, pulse 93, temperature 98 F (36.7 C),  temperature source Oral, resp. rate 20, height 6' 1"  (1.854 m), weight 118.8 kg, SpO2 95 %. on room air       No intake or output data in the 24 hours ending 02/24/21 1625 Filed Weights   02/24/21 1018  Weight: 118.8 kg    Examination: General: sitting in bed, obese, no acute distress HEENT: MM pink/moist, anicteric, trachea midline  Neuro: GCS 15, RASS 0, PERRLA CV: S1S2, NSR, no m/r/g appreciated PULM:  Air movement appreciated in all lobes, chest expansion symmetric, tachypnic GI: soft, bsx4 active, nontender Extremities: warm/dry, no edema, capillary refill less than 3 seconds  Skin: no rashes or lesions   Labs/imaging that I havepersonally reviewed  (right click and "Reselect all SmartList Selections" daily)  CBC- WNL BMP-WNL CXR- Left effusion Chest CT- Left pleural effusion and L infrahilar mass  Resolved Hospital Problem list     Assessment & Plan:  Left Pleural Effusion Left Infrahilar Mass- Extends into LLL 1136mubpleural nodule  As seen on 5/24 CT and CXR. Last dose of Xeralto PM 5/23. Dr. SmiTamala Julianoke with Mr. Corey PATTIgarding his imaging and concerns. P: -High risk thoracentesis right now given Xeralto. Will plan to do thoracentesis on AM of 5/23 with fluid sampling including cytology.  -Hold Xeralto and systemic AC for thoracentesis.  -Dr. IcaValeta Harmsr. ByrLamonte Sakair Dr. SmiTamala Julianll plan to do bronchoscopy on Thursday for pulmonary mass. -Duo Nebs PRN  OSA on CPAP P: -continue CPAP at night  All other issues per primary   Best practice (right click  and "Reselect all SmartList Selections" daily)  Per primary  Labs   CBC: Recent Labs  Lab 02/24/21 1020  WBC 5.5  HGB 12.1*  HCT 39.0  MCV 95.4  PLT 158    Basic Metabolic Panel: Recent Labs  Lab 02/24/21 1020  NA 136  K 4.0  CL 104  CO2 23  GLUCOSE 119*  BUN 16  CREATININE 1.03  CALCIUM 9.4   GFR: Estimated Creatinine Clearance: 86.3 mL/min (by C-G formula based on SCr of 1.03  mg/dL). Recent Labs  Lab 02/24/21 1020  WBC 5.5    Liver Function Tests: No results for input(s): AST, ALT, ALKPHOS, BILITOT, PROT, ALBUMIN in the last 168 hours. No results for input(s): LIPASE, AMYLASE in the last 168 hours. No results for input(s): AMMONIA in the last 168 hours.  ABG    Component Value Date/Time   TCO2 26 10/31/2012 1253     Coagulation Profile: No results for input(s): INR, PROTIME in the last 168 hours.  Cardiac Enzymes: No results for input(s): CKTOTAL, CKMB, CKMBINDEX, TROPONINI in the last 168 hours.  HbA1C: Hgb A1c MFr Bld  Date/Time Value Ref Range Status  07/07/2012 05:30 AM 5.9 (H) <5.7 % Final    Comment:    (NOTE)                                                                       According to the ADA Clinical Practice Recommendations for 2011, when HbA1c is used as a screening test:  >=6.5%   Diagnostic of Diabetes Mellitus           (if abnormal result is confirmed) 5.7-6.4%   Increased risk of developing Diabetes Mellitus References:Diagnosis and Classification of Diabetes Mellitus,Diabetes Care,2011,34(Suppl 1):S62-S69 and Standards of Medical Care in         Diabetes - 2011,Diabetes XENM,0768,08 (Suppl 1):S11-S61.  07/06/2012 12:24 AM 6.0 (H) <5.7 % Final    Comment:    (NOTE)                                                                       According to the ADA Clinical Practice Recommendations for 2011, when HbA1c is used as a screening test:  >=6.5%   Diagnostic of Diabetes Mellitus           (if abnormal result is confirmed) 5.7-6.4%   Increased risk of developing Diabetes Mellitus References:Diagnosis and Classification of Diabetes Mellitus,Diabetes UPJS,3159,45(OPFYT 1):S62-S69 and Standards of Medical Care in         Diabetes - 2011,Diabetes WKMQ,2863,81 (Suppl 1):S11-S61.    CBG: No results for input(s): GLUCAP in the last 168 hours.  Review of Systems:    Positives in [place]  Gen: fever, chills, weight change,  fatigue, night sweats HEENT:  blurred vision, double vision, hearing loss, tinnitus, sinus congestion, rhinorrhea, sore throat, neck stiffness, dysphagia PULM: shortness of breath, cough, sputum production, hemoptysis, wheezing CV: chest pain, edema, orthopnea, paroxysmal nocturnal dyspnea, palpitations GI:  abdominal pain,  nausea, vomiting, diarrhea, hematochezia, melena, constipation, change in bowel habits GU:  dysuria, hematuria, polyuria, oliguria, urethral discharge Endocrine:  hot or cold intolerance, polyuria, polyphagia or appetite change Derm: rash, dry skin, scaling or peeling skin change Heme:  easy bruising, bleeding, bleeding gums Neuro:  headache, numbness, weakness, slurred speech, loss of memory or consciousness   Past Medical History:  He,  has a past medical history of Arthritis, Carotid artery disease ([place]) (08/30/2016), Chronic lower back pain, Coronary atherosclerosis of native coronary artery, Diverticulitis, Dyslipidemia, Essential hypertension, benign, GERD (gastroesophageal reflux disease), Lumbar radiculopathy, chronic (02/04/2015), Obesity, OSA on CPAP, Paroxysmal atrial fibrillation ([place]), Pneumonia (01/1996), PONV (postoperative nausea and vomiting), Prostate CA ([place]), Recurrent upper respiratory infection (URI), and Visit for monitoring Tikosyn therapy (09/18/2019).   Surgical History:   Past Surgical History:  Procedure Laterality Date  . ADENOIDECTOMY    . ANTERIOR CERVICAL DECOMP/DISCECTOMY FUSION  07/2001; 10/2002   "C5-6; C6-7; redo"  . BACK SURGERY    . CARPAL TUNNEL RELEASE Left 10/2015  . COLONOSCOPY W/ POLYPECTOMY  02/2014  . CORONARY ANGIOPLASTY WITH STENT PLACEMENT  05/2003; 12/2005   "mid RCA; mid RCA"  . HAND SURGERY  02/2019   LEFT HAND  . implantable loop recorder placement  02/27/2020   Medtronic Reveal [place] model [id number]  implantable loop recorder implanted by Dr Rayann Heman for afib management and evaluation of presyncope  . JOINT  REPLACEMENT    . KNEE ARTHROSCOPY Left 10/2005  . KNEE ARTHROSCOPY W/ PARTIAL MEDIAL MENISCECTOMY Left 09/2005  . LUMBAR LAMINECTOMY/DECOMPRESSION MICRODISCECTOMY  03/2005   "L4-5"  . POSTERIOR LUMBAR FUSION  10/2003   L5-S1; "plates, screws"  . SHOULDER ARTHROSCOPY Right 08/2011   Debridement of labrum, arthroscopic distal clavicle excision  . SHOULDER OPEN ROTATOR CUFF REPAIR Left 07/2014  . TEE WITHOUT CARDIOVERSION  07/07/2012   Procedure: TRANSESOPHAGEAL ECHOCARDIOGRAM (TEE);  Surgeon: Fay Records, MD;  Location: [place];  Service: Cardiovascular;  Laterality: N/A;  . TONSILLECTOMY AND ADENOIDECTOMY  ~ 1956  . TOTAL KNEE ARTHROPLASTY Left 10/2006  . TRIGGER FINGER RELEASE Left 10/2015     Social History:   reports that he quit smoking about 17 years ago. His smoking use included cigarettes. He has a 34.00 pack-year smoking history. He has never used smokeless tobacco. He reports that he does not drink alcohol and does not use drugs.   Family History:  His family history includes Blindness in his son; Heart attack in his father; Heart disease in his father; Hypertension in his mother; Parkinson's disease in his brother; Prostate cancer in his brother. There is no history of Breast cancer, Colon cancer, or Pancreatic cancer.   Allergies Allergies  Allergen Reactions  . Amoxicillin Swelling  . Fosinopril Other (See Comments)  . Hydromorphone Other (See Comments) and Nausea Only  . Ampicillin Rash  . Codeine Nausea Only and Other (See Comments)    Heavy amounts cause nausea  . Monopril [Fosinopril Sodium] Other (See Comments)    Muscle aches & pain  . Percocet [Oxycodone-Acetaminophen] Nausea And Vomiting     Home Medications  Prior to Admission medications   Medication Sig Start Date End Date Taking? Authorizing Provider  acetaminophen (TYLENOL 8 HOUR ARTHRITIS PAIN) 650 MG CR tablet Take 1,300 mg by mouth as needed for pain (pain).     [provider]  albuterol  (VENTOLIN HFA) 108 (90 Base) MCG/ACT inhaler Inhale 2 puffs into the lungs every 4 hours as needed 01/08/21   Maudie Mercury,  Albin Felling, DO  atorvastatin (LIPITOR) 40 MG tablet Take 1 tablet (40 mg total) by mouth daily. 01/21/21   Nahser, Wonda Cheng, MD  azelastine (ASTELIN) 0.1 % nasal spray Place 2 sprays into both nostrils 2 (two) times daily. 09/18/20   Garnet Sierras, DO  clindamycin (CLEOCIN) 300 MG capsule Take 300 mg by mouth. Uses for dental procedures    [provider]  dicyclomine (BENTYL) 20 MG tablet Take 1 tablet by mouth as needed.    [provider]  diltiazem (CARDIZEM) 30 MG tablet Take 1 tablet (30 mg total) by mouth 4 (four) times daily as needed. 01/21/21   Nahser, Wonda Cheng, MD  dofetilide (TIKOSYN) 500 MCG capsule Take 1 capsule (500 mcg total) by mouth 2 (two) times daily. Please keep upcoming appointment in February for future refills. Thank you 01/21/21   Nahser, Wonda Cheng, MD  ergocalciferol (VITAMIN D2) 1.25 MG (50000 UT) capsule Take 1 capsule (50,000 Units total) by mouth once a week. 10/10/20   Brunetta Genera, MD  fenofibrate 160 MG tablet TAKE 1 TABLET DAILY. PLEASE KEEP UPCOMING APPT IN MARCH WITH DR. Acie Fredrickson BEFORE ANYMORE REFILLS. 01/21/21   Nahser, Wonda Cheng, MD  fluticasone (FLONASE) 50 MCG/ACT nasal spray Place 2 sprays into both nostrils daily. 09/18/20   Garnet Sierras, DO  fluticasone (FLOVENT HFA) 110 MCG/ACT inhaler Inhale 2 puffs into the lungs in the morning and at bedtime. with spacer and rinse mouth afterwards. 12/12/20   Garnet Sierras, DO  leuprolide, 6 Month, (ELIGARD) 45 MG injection Inject 45 mg into the skin every 6 (six) months.    [provider]  levocetirizine (XYZAL) 5 MG tablet Take 5 mg by mouth every evening.    [provider]  loperamide (IMODIUM) 2 MG capsule Take 2 mg by mouth as needed for diarrhea or loose stools.    [provider]  magnesium oxide (MAG-OX) 400 MG tablet TAKE 1 TABLET BY MOUTH EVERY DAY 09/10/20   Allred,  Jeneen Rinks, MD  metoprolol succinate (TOPROL XL) 25 MG 24 hr tablet Take 1 tablet (25 mg total) by mouth daily. 08/04/20   Thompson Grayer, MD  Multiple Vitamins-Minerals (MULTIVITAMINS THER. W/MINERALS) TABS Take 1 tablet by mouth daily.    [provider]  nitroGLYCERIN (NITROSTAT) 0.4 MG SL tablet Place 1 tablet (0.4 mg total) under the tongue every 5 (five) minutes as needed for chest pain. 01/21/21   Nahser, Wonda Cheng, MD  omeprazole (PRILOSEC) 10 MG capsule TAKE 1 CAPSULE DAILY. 01/21/21   Nahser, Wonda Cheng, MD  oxybutynin (DITROPAN) 5 MG tablet Take 1 tablet by mouth 2 (two) times daily as needed.    [provider]  potassium chloride (KLOR-CON) 10 MEQ tablet Take 1 tablet (10 mEq total) by mouth daily. 10/23/20   Nahser, Wonda Cheng, MD  Saccharomyces boulardii (PROBIOTIC) 250 MG CAPS Take 1 capsule by mouth daily.    [provider]  tamsulosin (FLOMAX) 0.4 MG CAPS capsule Take 0.4 mg by mouth daily.    [provider]  topiramate (TOPAMAX) 50 MG tablet Take 2 tablets (100 mg total) by mouth 2 (two) times daily. 01/16/20   Frann Rider, NP  traZODone (DESYREL) 50 MG tablet Take 0.5 tablets (25 mg total) by mouth at bedtime as needed for sleep. 09/22/20   Frann Rider, NP  XARELTO 20 MG TABS tablet TAKE 1 TABLET BY MOUTH EVERY DAY 10/06/20   Nahser, Wonda Cheng, MD     Critical  care time: N/A     [employer]., MSN, APRN, AGACNP-BC [facility]  02/24/2021 , 4:26 PM  Please see [email or web address] for pager details  If no response, please call [phone] After hours, please call Elink at [phone]

## 2021-02-24 NOTE — ED Notes (Signed)
Called the pharmacy to verify pt's meds

## 2021-02-25 ENCOUNTER — Encounter (HOSPITAL_COMMUNITY): Payer: Self-pay | Admitting: Internal Medicine

## 2021-02-25 ENCOUNTER — Encounter (HOSPITAL_COMMUNITY)
Admission: EM | Disposition: A | Discharge status: Home / Self Care | Payer: Self-pay | Source: Home / Self Care | Attending: Internal Medicine

## 2021-02-25 ENCOUNTER — Other Ambulatory Visit: Payer: Self-pay

## 2021-02-25 ENCOUNTER — Inpatient Hospital Stay (HOSPITAL_COMMUNITY): Payer: Medicare PPO

## 2021-02-25 DIAGNOSIS — R0602 Shortness of breath: Secondary | ICD-10-CM | POA: Diagnosis not present

## 2021-02-25 DIAGNOSIS — G4733 Obstructive sleep apnea (adult) (pediatric): Secondary | ICD-10-CM | POA: Diagnosis not present

## 2021-02-25 DIAGNOSIS — J90 Pleural effusion, not elsewhere classified: Secondary | ICD-10-CM | POA: Diagnosis not present

## 2021-02-25 DIAGNOSIS — R918 Other nonspecific abnormal finding of lung field: Secondary | ICD-10-CM | POA: Diagnosis not present

## 2021-02-25 DIAGNOSIS — E440 Moderate protein-calorie malnutrition: Secondary | ICD-10-CM | POA: Insufficient documentation

## 2021-02-25 DIAGNOSIS — I480 Paroxysmal atrial fibrillation: Secondary | ICD-10-CM | POA: Diagnosis not present

## 2021-02-25 HISTORY — PX: THORACENTESIS: SHX235

## 2021-02-25 LAB — CBC
HCT: 36.8 % — ABNORMAL LOW (ref 39.0–52.0)
Hemoglobin: 11.6 g/dL — ABNORMAL LOW (ref 13.0–17.0)
MCH: 29.7 pg (ref 26.0–34.0)
MCHC: 31.5 g/dL (ref 30.0–36.0)
MCV: 94.4 fL (ref 80.0–100.0)
Platelets: 289 10*3/uL (ref 150–400)
RBC: 3.9 MIL/uL — ABNORMAL LOW (ref 4.22–5.81)
RDW: 15.9 % — ABNORMAL HIGH (ref 11.5–15.5)
WBC: 5.9 10*3/uL (ref 4.0–10.5)
nRBC: 0 % (ref 0.0–0.2)

## 2021-02-25 LAB — CUP PACEART REMOTE DEVICE CHECK
Date Time Interrogation Session: 20220524100558
Implantable Pulse Generator Implant Date: 20210526

## 2021-02-25 LAB — BASIC METABOLIC PANEL
Anion gap: 5 (ref 5–15)
BUN: 17 mg/dL (ref 8–23)
CO2: 25 mmol/L (ref 22–32)
Calcium: 9.3 mg/dL (ref 8.9–10.3)
Chloride: 103 mmol/L (ref 98–111)
Creatinine, Ser: 0.98 mg/dL (ref 0.61–1.24)
GFR, Estimated: >60 mL/min (ref >60)
Glucose, Bld: 115 mg/dL — ABNORMAL HIGH (ref 70–99)
Potassium: 4.5 mmol/L (ref 3.5–5.1)
Sodium: 133 mmol/L — ABNORMAL LOW (ref 135–145)

## 2021-02-25 LAB — BODY FLUID CELL COUNT WITH DIFFERENTIAL
Eos, Fluid: 0 %
Lymphs, Fluid: 35 %
Monocyte-Macrophage-Serous Fluid: 65 % (ref 50–90)
Neutrophil Count, Fluid: 0 % (ref 0–25)
Total Nucleated Cell Count, Fluid: 2869 cu mm — ABNORMAL HIGH (ref 0–1000)

## 2021-02-25 LAB — PROTEIN, PLEURAL OR PERITONEAL FLUID: Total protein, fluid: 4.4 g/dL

## 2021-02-25 LAB — ECHOCARDIOGRAM COMPLETE
AR max vel: 4.2 cm2
AV Area VTI: 3.69 cm2
AV Area mean vel: 3.88 cm2
AV Mean grad: 5 mmHg
AV Peak grad: 9.2 mmHg
Ao pk vel: 1.52 m/s
Area-P 1/2: 3.31 cm2
Height: 73 in
S' Lateral: 3.8 cm
Weight: 4192 oz

## 2021-02-25 LAB — PROTEIN, TOTAL: Total Protein: 6.4 g/dL — ABNORMAL LOW (ref 6.5–8.1)

## 2021-02-25 LAB — LACTATE DEHYDROGENASE, PLEURAL OR PERITONEAL FLUID: LD, Fluid: 179 U/L — ABNORMAL HIGH (ref 3–23)

## 2021-02-25 LAB — MAGNESIUM: Magnesium: 1.8 mg/dL (ref 1.7–2.4)

## 2021-02-25 LAB — LACTATE DEHYDROGENASE: LDH: 166 U/L (ref 98–192)

## 2021-02-25 SURGERY — THORACENTESIS
Anesthesia: LOCAL

## 2021-02-25 MED ORDER — PERFLUTREN LIPID MICROSPHERE
1.0000 mL | INTRAVENOUS | Status: AC | PRN
  Administered 2021-02-25: 5 mL via INTRAVENOUS
  Filled 2021-02-25: qty 10

## 2021-02-25 MED ORDER — ENSURE ENLIVE PO LIQD
237.0000 mL | Freq: Two times a day (BID) | ORAL | Status: DC
  Administered 2021-02-25 – 2021-02-27 (×2): 237 mL via ORAL

## 2021-02-25 MED ORDER — MORPHINE SULFATE (PF) 2 MG/ML IV SOLN
2.0000 mg | INTRAVENOUS | Status: DC | PRN
Start: 2021-02-25 — End: 2021-02-27

## 2021-02-25 MED ORDER — HYDROCOD POLST-CPM POLST ER 10-8 MG/5ML PO SUER
5.0000 mL | Freq: Two times a day (BID) | ORAL | Status: DC | PRN
Start: 2021-02-25 — End: 2021-02-27

## 2021-02-25 NOTE — Progress Notes (Signed)
Initial Nutrition Assessment  DOCUMENTATION CODES:  Non-severe (moderate) malnutrition in context of chronic illness  INTERVENTION:  Add Ensure Enlive po BID, each supplement provides 350 kcal and 20 grams of protein.  Add Magic cup BID with meals, each supplement provides 290 kcal and 9 grams of protein.  Continue MVI with minerals daily.  NUTRITION DIAGNOSIS:  Moderate Malnutrition related to chronic illness,cancer and cancer related treatments as evidenced by energy intake < 75% for > or equal to 1 month,moderate fat depletion,mild muscle depletion,moderate muscle depletion.  GOAL:  Patient will meet greater than or equal to 90% of their needs  MONITOR:  PO intake,Supplement acceptance,Weight trends,I & O's  REASON FOR ASSESSMENT:  Malnutrition Screening Tool    ASSESSMENT:  75 yo male with a PMH of prostate cancer (03/2020) s/p raditation therapy (07/2020), CAD s/p stent, A-fib, HTN, and 20 year smoking history who presents with pleural effusion in L lung. 5/25 - thoracentesis today  Spoke with pt and wife at bedside. Both in very good spirits. Pt reports that since finishing radiation in October, he has had a decreased appetite. He does not skip meals, but he reports eating about 60% of what he used to eat. He takes 1 Ensure daily for a mid afternoon/evening snack at home regularly.   Pt's weight has fluctuated with fluid accumulation from pleural effusion. Pt's weight has remained stable since 08/2020 with mild fluctuations between 118-121 kg. It is difficult to determine if this is true weight change or fluid.  On exam, pt has depletions in upper body (face and clavicle), but none in the lower body.  Given decreased intake and depletions in fat and muscle, pt moderately malnourished and at risk for severe.  Recommend adding Ensure BID and Magic Cup BID to promote caloric and protein intake.  Medications: reviewed; Mag-Ox, MVI with minerals, Protonix, Klor-Con 10 mEq,  Vitamin D 50000 units weekly  Labs: reviewed   NUTRITION - FOCUSED PHYSICAL EXAM: Flowsheet Row Most Recent Value  Orbital Region Moderate depletion  Upper Arm Region No depletion  Thoracic and Lumbar Region No depletion  Buccal Region Moderate depletion  Temple Region Moderate depletion  Clavicle Bone Region Mild depletion  Clavicle and Acromion Bone Region No depletion  Scapular Bone Region No depletion  Dorsal Hand No depletion  Patellar Region No depletion  Anterior Thigh Region No depletion  Posterior Calf Region No depletion  Edema (RD Assessment) None  Hair Reviewed  Eyes Reviewed  Mouth Reviewed  Skin Reviewed  Nails Reviewed     Diet Order:   Diet Order            Diet Heart Room service appropriate? Yes; Fluid consistency: Thin  Diet effective now                EDUCATION NEEDS:  Education needs have been addressed  Skin:  Skin Assessment: Reviewed RN Assessment  Last BM:  PTA/unknown  Height:  Ht Readings from Last 1 Encounters:  02/24/21 6\' 1"  (1.854 m)   Weight:  Wt Readings from Last 1 Encounters:  02/24/21 118.8 kg   Ideal Body Weight:  83.6 kg  BMI:  Body mass index is 34.57 kg/m.  Estimated Nutritional Needs: Kcal:  1950-2150 Protein:  100-115 grams Fluid:  >2 L  Derrel Nip, RD, LDN Registered Dietitian After Hours/Weekend Pager # [phone]

## 2021-02-25 NOTE — Progress Notes (Signed)
  Echocardiogram 2D Echocardiogram has been performed.  Corey [name] 02/25/2021, 11:07 AM

## 2021-02-25 NOTE — Consult Note (Signed)
NAME:  Corey [name], MRN:  [id number], DOB:  [birth year], LOS: 1 ADMISSION DATE:  02/24/2021, CONSULTATION DATE:  5/24 REFERRING MD:  Ashok Cordia MD, CHIEF COMPLAINT:  SOB   History of Present Illness:  75 y/o M with a PMX of prostate cancer (03/2020) s/p raditation therapy, CAD with stent A.fib, HTN, OSA on CPAP, and 20 year smoking history, who presented to the [facility] ED on 5/24 with complaints of shortness of breath.   Two to three months ago he developed SOB symptoms that gradually became worse. Endorses exertional dyspnea, coughing, expectorating clear/yellow sputum, SOB, night sweats (belives this is due to the eligard), slight dysphagia. Denies fever, chills, losing weight without trying, chest pain, hemoptysis, sick contacts, tb exposure, asbestos exposure (but was in [employer]). Currently retired. 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 Over the past few weeks his symptoms continued to worsen. He was started on albuterol which initially provided some relief.  He presented to the [facility] ED with SOB. A CT chest and CXR were obtained that were concerning for a left pleural effusion. PCCM was consulted for evaluation of the effusion. Patients last dose of xeralto was the evening of 5/23. CT scan was also concerning for a 74mm subpleural nodule in the LUL.     Pertinent  Medical History  Prostate cancer (03/2020) s/p raditation therapy, A.fib, HTN, 34 pack year smoking history, GERD, diverticulitis, OSA on CPAP, CAD with stent   Significant Hospital Events: Including procedures, antibiotic start and stop dates in addition to other pertinent events   . 5/24 Admit with SOB. CT shows L effusion and 68mm subpleural nodule in the LUL.   Marland Kitchen 5/25 Planned L thora  Interim History / Subjective:  Pt reports feeling poorly, didn't sleep well due to cough  Normally has good response to tussionex  Denies fever, chills, sputum production  Objective   Blood pressure 131/80, pulse 66, temperature 98.1  F (36.7 C), temperature source Axillary, resp. rate 18, height 6\' 1"  (1.854 m), weight 118.8 kg, SpO2 100 %. on room air       No intake or output data in the 24 hours ending 02/25/21 0902 Filed Weights   02/24/21 1018  Weight: 118.8 kg    Examination: General: adult male sitting up in bed in NAD   HEENT: MM pink/moist, wearing glasses  Neuro: AAOx3, speech clear, MAE CV: s1s2 RRR, no m/r/g PULM:  Non-labored, diminished on left with dullness to percussion, clear on right  GI: soft, bsx4 active  Extremities: warm/dry, no edema  Skin: no rashes or lesions   Labs/imaging that I havepersonally reviewed  (right click and "Reselect all SmartList Selections" daily)  CXR  CBC BMP  Resolved Hospital Problem list     Assessment & Plan:   Left Pleural Effusion Left Infrahilar Mass - Extends into LLL 44mm subpleural nodule  Large left pleural effusion with concern for left infrahilar masse as seen on 5/24 CT and CXR. Last dose of Xeralto PM 5/23.  -plan for thoracentesis at 2pm 5/25 -pt consented for procedure, will sign at time of procedure  -hold anticoagulation  -send pleural studies for LDH, protein, GS, cell count with diff, culture, cytology  -duoneb PRN  -add tussionex PRN for cough  -plan for possible biospy on 5/26 for mass   OSA on CPAP -CPAP QHS  All other issues per primary   Best practice (right click and "Reselect all SmartList Selections" daily)  Per primary   Critical care time:  N/A    Noe Gens, MSN, APRN, NP-C, AGACNP-BC [facility] 02/25/2021, 9:17 AM   Please see [email or web address] for pager details.   From 7A-7P if no response, please call [phone] After hours, please call ELink [phone]

## 2021-02-25 NOTE — Procedures (Signed)
Thoracentesis  Procedure Note  Corey [name]  [id number]  03/15/1946  Date:02/25/21  Time:2:42 PM   Provider Performing:Symir Mah D Rollene Rotunda   Procedure: Thoracentesis with imaging guidance (74081)  Indication(s) Pleural Effusion  Consent Risks of the procedure as well as the alternatives and risks of each were explained to the patient and/or caregiver.  Consent for the procedure was obtained and is signed in the bedside chart  Anesthesia Topical only with 1% lidocaine    Time Out Verified patient identification, verified procedure, site/side was marked, verified correct patient position, special equipment/implants available, medications/allergies/relevant history reviewed, required imaging and test results available.   Sterile Technique Maximal sterile technique including full sterile barrier drape, hand hygiene, sterile gown, sterile gloves, mask, hair covering, sterile ultrasound probe cover (if used).  Procedure Description Ultrasound was used to identify appropriate pleural anatomy for placement and overlying skin marked.  Area of drainage cleaned and draped in sterile fashion. Lidocaine was used to anesthetize the skin and subcutaneous tissue.  1500 cc's of serous sanginous appearing fluid was drained from the left pleural space. Catheter then removed and bandaid applied to site.   Complications/Tolerance None; patient tolerated the procedure well. Chest X-ray is ordered to confirm no post-procedural complication.   EBL Minimal   Specimen(s) Pleural fluid   JD Rexene Agent [facility] 02/25/2021, 2:43 PM  Please see [email or web address] for pager details.  From 7A-7P if no response, please call [phone]. After hours, please call ELink [phone].

## 2021-02-25 NOTE — Progress Notes (Signed)
RT note. Patient will place self on cpap when ready for bed, cpap all set up.

## 2021-02-25 NOTE — Progress Notes (Signed)
Placed patient on CPAP via FFM, auto settings per pt (max13-min 5) Tolerating well at this time.

## 2021-02-25 NOTE — Progress Notes (Signed)
Per tele QTc interval was calculated at 480 msec for tikosyn medication administration.

## 2021-02-25 NOTE — Discharge Instructions (Signed)
Nutrition Post Hospital Stay Proper nutrition can help your body recover from illness and injury.   Foods and beverages high in protein, vitamins, and minerals help rebuild muscle loss, promote healing, & reduce fall risk.   .In addition to eating healthy foods, a nutrition shake is an easy, delicious way to get the nutrition you need during and after your hospital stay  It is recommended that you continue to drink 2 bottles per day of:       Ensure Enlive for at least 1 month (30 days) after your hospital stay   Tips for adding a nutrition shake into your routine: As allowed, drink one with vitamins or medications instead of water or juice Enjoy one as a tasty mid-morning or afternoon snack Drink cold or make a milkshake out of it Drink one instead of milk with cereal or snacks Use as a coffee creamer   Available at the following grocery stores and pharmacies:           * [employer] * [employer] * [employer]  * R[employer] Aid          * Walmart * [employer]  * Wal[facility]reens      * Target  * [employer]J's   * [facility]  * [employer]   * [facility] [phone]            For COUPONS visit: [email or web address] or [email or web address]   Suggested Substitutions Ensure Plus = Boost Plus = Carnation Breakfast Essentials = Boost Compact Ensure Active Clear = Boost Breeze Glucerna Shake = Boost Glucose Control = Carnation Breakfast Essentials SUGAR FREE     __________________________________________- Suggestions For Increasing Calories And Protein . Several small meals a day are easier to eat and digest than three large ones. Space meals about 2 to 3 hours apart to maximize comfort. . Stop eating 2 to 3 hours before bed and sleep with your head elevated if gastric reflux (GERD) and heartburn are problems. . Do not eat your favorite foods if you are feeling bad. Save them for when you feel good! . Eat breakfast-type foods at any meal. Eggs are usually easy to eat and are great any  time of the day. (The same goes for pancakes and waffles.) . Eat when you feel hungry. Most people have the greatest appetite in the morning because they have not eaten all night. If this is the best meal for you, then pile on those calories and other nutrients in the morning and at lunch. Then you can have a smaller dinner without losing total calories for the day. . Eat leftovers or nutritious snacks in the afternoon and early evening to round out your day. . Try homemade or commercially prepared nutrition bars and puddings, as well as calorie- and protein-rich liquid nutritional supplements. Benefits of Physical Activity Talk to your doctor about physical activity. Light or moderate physical activity can help maintain muscle and promote an appetite. Walking in the neighborhood or the local mall is a great way to get up, get out, and get moving. If you are unsteady on your feet, try walking around the dining room table. Save Room for Calorie-Rich Food! Drink most fluids between meals instead of with meals. (It is fine to have a sip to help swallow food at meal time.) Fluids (which usually have fewer calories and nutrients than solid food) can take up valuable space in your stomach.  Foods Recommended High-Protein Foods Milk products Add cheese to toast, crackers, sandwiches,   baked potatoes, vegetables, soups, noodles, meat, and fruit. Use reduced-fat (2%) or whole milk in place of water when cooking cereal and cream soups. Include cream sauces on vegetables and pasta. Add powdered milk to cream soups and mashed potatoes.  Eggs Have hard-cooked eggs readily available in the refrigerator. Chop and add to salads, casseroles, soups, and vegetables. Make a quick egg salad. All eggs should be well cooked to avoid the risk of harmful bacteria.  Meats, poultry, and fish Add leftover cooked meats to soups, casseroles, salads, and omelets. Make dip by mixing diced, chopped, or shredded meat with sour cream and  spices.  Beans, legumes, nuts, and seeds Sprinkle nuts and seeds on cereals, fruit, and desserts such as ice cream, pudding, and custard. Also serve nuts and seeds on vegetables, salads, and pasta. Spread peanut butter on toast, bread, English muffins, and fruit, or blend it in a milk shake. Add beans and peas to salads, soups, casseroles, and vegetable dishes.  High-Calorie Foods Butter, margarine, and  oils Melt butter or margarine over potatoes, rice, pasta, and cooked vegetables. Add melted butter or margarine into soups and casseroles and spread on bread for sandwiches before spreading sandwich spread or peanut butter. Saut or stir-fry vegetables, meats, chicken and fish such as shrimp/scallops in olive or canola oil. A variety of oils add calories and can be used to marinate meat, chicken, or fish.  Milk products Add whipping cream to desserts, pancakes, waffles, fruit, and hot chocolate, and fold it into soups and casseroles. Add sour cream to baked potatoes and vegetables.  Salad dressing Use regular (not low-fat or diet) mayonnaise and salad dressing on sandwiches and in dips with vegetables and fruit.   Sweets Add jelly and honey to bread and crackers. Add jam to fruit and ice cream and as a topping over cake.   Copyright 2020  Academy [employer]. All rights reserved.   

## 2021-02-25 NOTE — Plan of Care (Signed)

## 2021-02-25 NOTE — Progress Notes (Signed)
PROGRESS NOTE    Corey [name]  YDX:[id number] DOB: 06-09-1946 DOA: 02/24/2021 PCP: [name], MD   Brief Narrative:  HPI on 5/24/202 by Dr. Wynetta Fines Corey [name] is a 75 y.o. male with medical history significant of prostate cancer stage T2C, status post radiation therapy, paroxysmal A. 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  Symptoms started 2 to 3 months ago, gradually getting worse.  Initially with mild exertional dyspnea with some left-sided chest discomfort, cannot take deep breath.  Productive cough with some thin clear phlegm, no blood.  Few weeks ago, symptoms worsened with more constant cough and wheezing, went to see allergist who diagnosed him with asthma started him on as needed albuterol, which he tried with some relief.  However, breathing symptoms continued to get worse, no walking short distances within the house can cause severe shortness of breath.  And the left-sided chest pain " becomes more frequent and across to the right side" denies any fever chills, he estimated he lost few pounds recently and with constantly feeling fatigue.  Was diagnosed with prostate cancer June last year, underwent radiation therapy for 4 rounds ended December 2021, underwent PET scan last year which showed no metastatic sites.  Interim history Patient found to have large left-sided pleural effusion on x-ray.  CT chest without contrast also showed a large left-sided pleural effusion with questionable infrahilar mass extending to the left lower lobe.  Plan for thoracentesis and pulmonology consultation.  Assessment & Plan   Dyspnea secondary to large left-sided pleural effusion/infrahilar mass -Patient presented with several months of shortness of breath however it worsened over the last several days where he was unable to walk around the house without feeling short of breath and having to stop.-CT chest abdomen  pelvis without contrast showed new large left pleural effusion with subtotal atelectasis of the left lower lobe and partial of the left upper lobe.  New azygoesophageal recess adenopathy.  Suspicion of a left infrahilar mass extending into the left lower lobe medially, concerning for neoplasm -Pulmonology consulted and appreciated, planning for thoracentesis later today and possibly bronchoscopy 02/26/2021 -Will order ambulatory oxygen saturation -Xarelto held -Of note patient does have history of prostate cancer  Paroxysmal atrial fibrillation -Xarelto currently held -Currently rate controlled -Continue Tikosyn, Cardizem  Prostate cancer -Patient follows with Dr. Junious Silk, urology -Status post radiation therapy -Continue outpatient follow-up  Asthma -Continue current breathing treatments  OSA -Continue CPAP nightly   DVT Prophylaxis Lovenox  Code Status: Full  Family Communication: None at bedside  Disposition Plan:  Status is: Inpatient  Remains inpatient appropriate because:Ongoing diagnostic testing needed not appropriate for outpatient work up and Inpatient level of care appropriate due to severity of illness.  Continues to feel short of breath and pending thoracentesis.   Dispo: The patient is from: Home              Anticipated d/c is to: Home              Patient currently is not medically stable to d/c.   Difficult to place patient No   Consultants Pulmonology  Procedures  None  Antibiotics   Anti-infectives (From admission, onward)   None      Subjective:   Corey [name] seen and examined today.  Continues to feel short of breath with limited movement such as walking to the bathroom or washing himself at the sink.  Feels he is unable to  walk around his house or to the mailbox.  Currently denies chest pain, dizziness, headache, abdominal pain, nausea or vomiting, diarrhea or constipation.   Objective:   Vitals:   02/25/21 0042 02/25/21 0515 02/25/21 0703  02/25/21 0821  BP:  108/72 131/80   Pulse:  (!) 119 67 66  Resp:  20  18  Temp:  98.1 F (36.7 C)    TempSrc:  Axillary    SpO2: 95% 96%  100%  Weight:      Height:       No intake or output data in the 24 hours ending 02/25/21 0848 Filed Weights   02/24/21 1018  Weight: 118.8 kg    Exam  General: Well developed, well nourished, NAD, appears stated age  HEENT: NCAT, PERRLA, EOMI, Anicteic Sclera, mucous membranes moist.   Neck: Supple  Cardiovascular: S1 S2 auscultated, no rubs, murmurs or gallops. Regular rate and rhythm.  Respiratory: Diminished on the left, otherwise right-sided lung sounds are clear  Abdomen: Soft, nontender, nondistended, + bowel sounds  Extremities: warm dry without cyanosis clubbing or edema  Neuro: AAOx3, nonfocal  Skin: Without rashes exudates or nodules  Psych: Normal affect and demeanor with intact judgement and insight   Data Reviewed: I have personally reviewed following labs and imaging studies  CBC: Recent Labs  Lab 02/24/21 1020 02/25/21 0218  WBC 5.5 5.9  HGB 12.1* 11.6*  HCT 39.0 36.8*  MCV 95.4 94.4  PLT 326 539   Basic Metabolic Panel: Recent Labs  Lab 02/24/21 1020  NA 136  K 4.0  CL 104  CO2 23  GLUCOSE 119*  BUN 16  CREATININE 1.03  CALCIUM 9.4   GFR: Estimated Creatinine Clearance: 86.3 mL/min (by C-G formula based on SCr of 1.03 mg/dL). Liver Function Tests: No results for input(s): AST, ALT, ALKPHOS, BILITOT, PROT, ALBUMIN in the last 168 hours. No results for input(s): LIPASE, AMYLASE in the last 168 hours. No results for input(s): AMMONIA in the last 168 hours. Coagulation Profile: No results for input(s): INR, PROTIME in the last 168 hours. Cardiac Enzymes: No results for input(s): CKTOTAL, CKMB, CKMBINDEX, TROPONINI in the last 168 hours. BNP (last 3 results) No results for input(s): PROBNP in the last 8760 hours. HbA1C: No results for input(s): HGBA1C in the last 72 hours. CBG: No results  for input(s): GLUCAP in the last 168 hours. Lipid Profile: No results for input(s): CHOL, HDL, LDLCALC, TRIG, CHOLHDL, LDLDIRECT in the last 72 hours. Thyroid Function Tests: No results for input(s): TSH, T4TOTAL, FREET4, T3FREE, THYROIDAB in the last 72 hours. Anemia Panel: No results for input(s): VITAMINB12, FOLATE, FERRITIN, TIBC, IRON, RETICCTPCT in the last 72 hours. Urine analysis:    Component Value Date/Time   COLORURINE AMBER (A) 06/04/2016 1304   APPEARANCEUR CLEAR 06/04/2016 1304   LABSPEC 1.019 06/04/2016 1304   PHURINE 5.5 06/04/2016 1304   GLUCOSEU NEGATIVE 06/04/2016 1304   HGBUR NEGATIVE 06/04/2016 1304   BILIRUBINUR NEGATIVE 06/04/2016 1304   KETONESUR NEGATIVE 06/04/2016 1304   PROTEINUR NEGATIVE 06/04/2016 1304   NITRITE NEGATIVE 06/04/2016 1304   LEUKOCYTESUR SMALL (A) 06/04/2016 1304   Sepsis Labs: @LABRCNTIP (procalcitonin:4,lacticidven:4)  ) Recent Results (from the past 240 hour(s))  Resp Panel by RT-PCR (Flu A&B, Covid) Nasopharyngeal Swab     Status: None   Collection Time: 02/24/21  2:08 PM   Specimen: Nasopharyngeal Swab; Nasopharyngeal(NP) swabs in vial transport medium  Result Value Ref Range Status   SARS Coronavirus 2 by RT PCR NEGATIVE NEGATIVE  Final    Comment: (NOTE) SARS-CoV-2 target nucleic acids are NOT DETECTED.  The SARS-CoV-2 RNA is generally detectable in upper respiratory specimens during the acute phase of infection. The lowest concentration of SARS-CoV-2 viral copies this assay can detect is 138 copies/mL. A negative result does not preclude SARS-Cov-2 infection and should not be used as the sole basis for treatment or other patient management decisions. A negative result may occur with  improper specimen collection/handling, submission of specimen other than nasopharyngeal swab, presence of viral mutation(s) within the areas targeted by this assay, and inadequate number of viral copies(<138 copies/mL). A negative result must be  combined with clinical observations, patient history, and epidemiological information. The expected result is Negative.  Fact Sheet for Patients:  EntrepreneurPulse.com.au  Fact Sheet for Healthcare Providers:  IncredibleEmployment.be  This test is no t yet approved or cleared by the [place] FDA and  has been authorized for detection and/or diagnosis of SARS-CoV-2 by FDA under an Emergency Use Authorization (EUA). This EUA will remain  in effect (meaning this test can be used) for the duration of the COVID-19 declaration under Section 564(b)(1) of the Act, 21 U.S.C.section 360bbb-3(b)(1), unless the authorization is terminated  or revoked sooner.       Influenza A by PCR NEGATIVE NEGATIVE Final   Influenza B by PCR NEGATIVE NEGATIVE Final    Comment: (NOTE) The Xpert Xpress SARS-CoV-2/FLU/RSV plus assay is intended as an aid in the diagnosis of influenza from Nasopharyngeal swab specimens and should not be used as a sole basis for treatment. Nasal washings and aspirates are unacceptable for Xpert Xpress SARS-CoV-2/FLU/RSV testing.  Fact Sheet for Patients: EntrepreneurPulse.com.au  Fact Sheet for Healthcare Providers: IncredibleEmployment.be  This test is not yet approved or cleared by the [place] FDA and has been authorized for detection and/or diagnosis of SARS-CoV-2 by FDA under an Emergency Use Authorization (EUA). This EUA will remain in effect (meaning this test can be used) for the duration of the COVID-19 declaration under Section 564(b)(1) of the Act, 21 U.S.C. section 360bbb-3(b)(1), unless the authorization is terminated or revoked.  Performed at [facility], [place] [address] [place], [place] [id number]       Radiology Studies: DG Chest 2 View  Result Date: 02/24/2021 CLINICAL DATA:  Chest pain, shortness of breath. EXAM: CHEST - 2 VIEW COMPARISON:  September 04, 2020.  FINDINGS: Stable cardiomegaly. No pneumothorax is noted. Right lung is clear. Interval development of moderate to large left pleural effusion is noted with associated left basilar atelectasis. Bony thorax is unremarkable. IMPRESSION: Interval development of moderate to large left pleural effusion with associated left basilar atelectasis. Electronically Signed   By: Marijo Conception M.D.   On: 02/24/2021 11:04   CT CHEST ABDOMEN PELVIS WO CONTRAST  Result Date: 02/24/2021 CLINICAL DATA:  LEFT side chest and abdominal pain with nausea. History of coronary artery disease post coronary PTCA, hypertension, GERD, atrial fibrillation, prostate cancer, former smoker EXAM: CT CHEST, ABDOMEN AND PELVIS WITHOUT CONTRAST TECHNIQUE: Multidetector CT imaging of the chest, abdomen and pelvis was performed following the standard protocol without IV contrast. Sagittal and coronal MPR images reconstructed from axial data set. COMPARISON:  CT abdomen and pelvis 02/18/2020, CT chest 12/10/2006 FINDINGS: CT CHEST FINDINGS Cardiovascular: Atherosclerotic calcifications aorta, coronary arteries and proximal great vessels. Aneurysmal dilatation ascending thoracic aorta 4.1 cm transverse image 28 previously 3.9 cm. Heart unremarkable. No pericardial effusion. Mediastinum/Nodes: Esophagus unremarkable. New enlarged azygoesophageal recess lymph nodes measuring 19 mm  image 37 and 19 mm image 35. Normal sized paratracheal nodes. 11 mm AP window node image 25 new. Base of cervical region unremarkable. Lungs/Pleura: Large LEFT pleural effusion. Suspected LEFT infrahilar mass extending into medial LEFT lower lobe with narrowing of LEFT lower lobe bronchi, ill-defined due to adjacent subtotal atelectasis in LEFT lower lobe with partial atelectasis of LEFT upper lobe. Minimal interstitial prominence at RIGHT lung base. No acute infiltrate or pneumothorax. Few small subpleural nodular foci in LEFT upper lobe, 5 mm image 69, 4 mm image 72, 5 mm  image 79. Questionable 11 mm subpleural nodule in lingula image 89. Musculoskeletal: No acute osseous findings. CT ABDOMEN PELVIS FINDINGS Hepatobiliary: Gallbladder and liver normal appearance Pancreas: Normal appearance Spleen: Normal appearance Adrenals/Urinary Tract: Adrenal glands, kidneys, ureters, and bladder normal appearance Stomach/Bowel: Sigmoid diverticulosis without evidence of diverticulitis. Stomach decompressed. Normal appendix. Vascular/Lymphatic: Atherosclerotic calcifications of aorta and iliac arteries. Short segment of displaced atherosclerotic calcification at the mid to distal abdominal aorta, previously assessed by CT angio exam, felt to represent irregular plaque rather than aneurysm or penetrating ulcer, please refer to prior study. Aorta normal caliber. No adenopathy. Reproductive: Few metallic clips at the prostate gland, which is otherwise unremarkable. Seminal vesicles normal appearance. Other: No free air or free fluid. Small RIGHT inguinal hernia containing fat. Musculoskeletal: No acute osseous findings. IMPRESSION: New large LEFT pleural effusion with subtotal atelectasis of LEFT lower lobe and partial atelectasis of LEFT upper lobe. Few small subpleural nodular foci in LEFT lung, largest 11 mm, nonspecific, new since 2008. New azygoesophageal recess adenopathy. Suspicion of a LEFT infrahilar mass extending into LEFT lower lobe medially with narrowing of LEFT lower lobe bronchi, concerning for LEFT infrahilar neoplasm. Sigmoid diverticulosis without evidence of diverticulitis. Small RIGHT inguinal hernia containing fat. No acute intra-abdominal or intrapelvic abnormalities. Aortic Atherosclerosis (ICD10-I70.0). Electronically Signed   By: Lavonia Dana M.D.   On: 02/24/2021 14:37     Scheduled Meds: . acidophilus  1 capsule Oral Daily  . atorvastatin  40 mg Oral Daily  . azelastine  2 spray Each Nare BID  . benzonatate  200 mg Oral TID  . budesonide (PULMICORT) nebulizer  solution  0.5 mg Nebulization BID  . diltiazem  30 mg Oral Q6H  . dofetilide  500 mcg Oral BID  . [START ON 02/26/2021] enoxaparin (LOVENOX) injection  40 mg Subcutaneous Q24H  . fenofibrate  54 mg Oral Daily  . fluticasone  2 spray Each Nare Daily  . guaiFENesin  1,200 mg Oral BID  . loratadine  10 mg Oral QPM  . magnesium oxide  400 mg Oral Daily  . metoprolol succinate  25 mg Oral Daily  . multivitamin with minerals  1 tablet Oral Daily  . pantoprazole  40 mg Oral Daily  . potassium chloride  10 mEq Oral Daily  . tamsulosin  0.4 mg Oral Daily  . topiramate  100 mg Oral BID  . [START ON 02/27/2021] Vitamin D (Ergocalciferol)  50,000 Units Oral Q Fri   Continuous Infusions:   LOS: 1 day   Time Spent in minutes   45 minutes  Fairy Ashlock D.O. on 02/25/2021 at 8:48 AM  Between 7am to 7pm - Please see pager noted on [email or web address]  After 7pm go to [email or web address]  And look for the night coverage person covering for me after hours  [facility] Office  [phone]

## 2021-02-25 NOTE — Progress Notes (Signed)
About 5 minutes after patient returned from thorocentisis procedure, patient called out stating that he was having a bad, constant cramping pain in his left chest region close to ribcage, gives a pain level of 10 on a scale of 0-10. Vital signs stable. MD notified. New orders placed. EKG performed. MD notified of results.   3:31pm: Asked patient if he wanted pain medication that was ordered and patient declined and stated that if the pain continues/gets worse he will take it.

## 2021-02-25 NOTE — Plan of Care (Signed)
  Problem: Education: Goal: Knowledge of General Education information will improve Description: Including pain rating scale, medication(s)/side effects and non-pharmacologic comfort measures Outcome: Progressing   Problem: Clinical Measurements: Goal: Respiratory complications will improve Outcome: Progressing   Problem: Clinical Measurements: Goal: Cardiovascular complication will be avoided Outcome: Progressing   

## 2021-02-26 ENCOUNTER — Encounter (HOSPITAL_COMMUNITY)
Admission: EM | Disposition: A | Discharge status: Home / Self Care | Payer: Self-pay | Source: Home / Self Care | Attending: Internal Medicine

## 2021-02-26 ENCOUNTER — Inpatient Hospital Stay (HOSPITAL_COMMUNITY): Payer: Medicare PPO | Admitting: Certified Registered"

## 2021-02-26 ENCOUNTER — Other Ambulatory Visit: Payer: Self-pay | Admitting: Oncology

## 2021-02-26 ENCOUNTER — Encounter (HOSPITAL_COMMUNITY): Payer: Self-pay | Admitting: Pulmonary Disease

## 2021-02-26 DIAGNOSIS — R918 Other nonspecific abnormal finding of lung field: Secondary | ICD-10-CM

## 2021-02-26 DIAGNOSIS — J90 Pleural effusion, not elsewhere classified: Secondary | ICD-10-CM | POA: Diagnosis not present

## 2021-02-26 DIAGNOSIS — I480 Paroxysmal atrial fibrillation: Secondary | ICD-10-CM | POA: Diagnosis not present

## 2021-02-26 DIAGNOSIS — G4733 Obstructive sleep apnea (adult) (pediatric): Secondary | ICD-10-CM | POA: Diagnosis not present

## 2021-02-26 HISTORY — PX: VIDEO BRONCHOSCOPY WITH ENDOBRONCHIAL ULTRASOUND: SHX6177

## 2021-02-26 HISTORY — PX: FINE NEEDLE ASPIRATION: SHX5430

## 2021-02-26 LAB — BASIC METABOLIC PANEL
Anion gap: 7 (ref 5–15)
BUN: 16 mg/dL (ref 8–23)
CO2: 25 mmol/L (ref 22–32)
Calcium: 9.5 mg/dL (ref 8.9–10.3)
Chloride: 104 mmol/L (ref 98–111)
Creatinine, Ser: 1.02 mg/dL (ref 0.61–1.24)
GFR, Estimated: >60 mL/min (ref >60)
Glucose, Bld: 118 mg/dL — ABNORMAL HIGH (ref 70–99)
Potassium: 4.8 mmol/L (ref 3.5–5.1)
Sodium: 136 mmol/L (ref 135–145)

## 2021-02-26 LAB — CBC
HCT: 37.2 % — ABNORMAL LOW (ref 39.0–52.0)
Hemoglobin: 11.9 g/dL — ABNORMAL LOW (ref 13.0–17.0)
MCH: 30.1 pg (ref 26.0–34.0)
MCHC: 32 g/dL (ref 30.0–36.0)
MCV: 93.9 fL (ref 80.0–100.0)
Platelets: 257 10*3/uL (ref 150–400)
RBC: 3.96 MIL/uL — ABNORMAL LOW (ref 4.22–5.81)
RDW: 15.8 % — ABNORMAL HIGH (ref 11.5–15.5)
WBC: 6.1 10*3/uL (ref 4.0–10.5)
nRBC: 0 % (ref 0.0–0.2)

## 2021-02-26 LAB — SURGICAL PCR SCREEN
MRSA, PCR: NEGATIVE
Staphylococcus aureus: NEGATIVE

## 2021-02-26 SURGERY — BRONCHOSCOPY, WITH EBUS
Anesthesia: General | Laterality: Left

## 2021-02-26 MED ORDER — PROPOFOL 10 MG/ML IV BOLUS
INTRAVENOUS | Status: DC | PRN
  Administered 2021-02-26: 200 mg via INTRAVENOUS

## 2021-02-26 MED ORDER — LACTATED RINGERS IV SOLN: INTRAVENOUS | Status: DC

## 2021-02-26 MED ORDER — CHLORHEXIDINE GLUCONATE 0.12 % MT SOLN
OROMUCOSAL | Status: AC
  Administered 2021-02-26: 15 mL via OROMUCOSAL
  Filled 2021-02-26: qty 15

## 2021-02-26 MED ORDER — ONDANSETRON HCL 4 MG/2ML IJ SOLN
INTRAMUSCULAR | Status: DC | PRN
  Administered 2021-02-26: 4 mg via INTRAVENOUS

## 2021-02-26 MED ORDER — ROCURONIUM BROMIDE 10 MG/ML (PF) SYRINGE
PREFILLED_SYRINGE | INTRAVENOUS | Status: DC | PRN
  Administered 2021-02-26: 100 mg via INTRAVENOUS

## 2021-02-26 MED ORDER — PHENYLEPHRINE 40 MCG/ML (10ML) SYRINGE FOR IV PUSH (FOR BLOOD PRESSURE SUPPORT)
PREFILLED_SYRINGE | INTRAVENOUS | Status: DC | PRN
  Administered 2021-02-26 (×2): 120 ug via INTRAVENOUS

## 2021-02-26 MED ORDER — CHLORHEXIDINE GLUCONATE 0.12 % MT SOLN: 15.0000 mL | Freq: Once | OROMUCOSAL | Status: AC

## 2021-02-26 MED ORDER — SCOPOLAMINE 1 MG/3DAYS TD PT72
1.0000 | MEDICATED_PATCH | TRANSDERMAL | Status: DC
  Administered 2021-02-26: 1 via TRANSDERMAL
  Filled 2021-02-26: qty 1

## 2021-02-26 MED ORDER — DICLOFENAC SODIUM 1 % EX GEL
4.0000 g | Freq: Four times a day (QID) | CUTANEOUS | Status: DC
  Administered 2021-02-26 – 2021-02-27 (×3): 4 g via TOPICAL
  Filled 2021-02-26: qty 100

## 2021-02-26 MED ORDER — LIDOCAINE 2% (20 MG/ML) 5 ML SYRINGE
INTRAMUSCULAR | Status: DC | PRN
  Administered 2021-02-26: 60 mg via INTRAVENOUS

## 2021-02-26 MED ORDER — SUGAMMADEX SODIUM 200 MG/2ML IV SOLN
INTRAVENOUS | Status: DC | PRN
  Administered 2021-02-26: 400 mg via INTRAVENOUS

## 2021-02-26 SURGICAL SUPPLY — 35 items
ADAPTER VALVE BIOPSY EBUS (MISCELLANEOUS) IMPLANT
ADPTR VALVE BIOPSY EBUS (MISCELLANEOUS)
BRUSH CYTOL CELLEBRITY 1.5X140 (MISCELLANEOUS) IMPLANT
CANISTER SUCT 3000ML PPV (MISCELLANEOUS) ×3 IMPLANT
CONT SPEC 4OZ CLIKSEAL STRL BL (MISCELLANEOUS) ×3 IMPLANT
COVER BACK TABLE 60X90IN (DRAPES) ×3 IMPLANT
FORCEPS BIOP RJ4 1.8 (CUTTING FORCEPS) IMPLANT
GAUZE SPONGE 4X4 12PLY STRL (GAUZE/BANDAGES/DRESSINGS) ×3 IMPLANT
GLOVE BIO SURGEON STRL SZ7.5 (GLOVE) ×3 IMPLANT
GOWN STRL REUS W/ TWL LRG LVL3 (GOWN DISPOSABLE) ×2 IMPLANT
GOWN STRL REUS W/ TWL XL LVL3 (GOWN DISPOSABLE) ×2 IMPLANT
GOWN STRL REUS W/TWL LRG LVL3 (GOWN DISPOSABLE) ×3
GOWN STRL REUS W/TWL XL LVL3 (GOWN DISPOSABLE) ×3
KIT CLEAN ENDO COMPLIANCE (KITS) ×6 IMPLANT
KIT TURNOVER KIT B (KITS) ×3 IMPLANT
MARKER SKIN DUAL TIP RULER LAB (MISCELLANEOUS) ×3 IMPLANT
NDL ASPIRATION VIZISHOT 19G (NEEDLE) IMPLANT
NDL ASPIRATION VIZISHOT 21G (NEEDLE) IMPLANT
NEEDLE ASPIRATION VIZISHOT 19G (NEEDLE) IMPLANT
NEEDLE ASPIRATION VIZISHOT 21G (NEEDLE) IMPLANT
NS IRRIG 1000ML POUR BTL (IV SOLUTION) ×3 IMPLANT
OIL SILICONE PENTAX (PARTS (SERVICE/REPAIRS)) ×3 IMPLANT
PAD ARMBOARD 7.5X6 YLW CONV (MISCELLANEOUS) ×6 IMPLANT
SYR 20ML ECCENTRIC (SYRINGE) ×6 IMPLANT
SYR 20ML LL LF (SYRINGE) ×6 IMPLANT
SYR 50ML SLIP (SYRINGE) IMPLANT
SYR 5ML LUER SLIP (SYRINGE) ×3 IMPLANT
TOWEL GREEN STERILE FF (TOWEL DISPOSABLE) ×3 IMPLANT
TRAP SPECIMEN MUCOUS 40CC (MISCELLANEOUS) IMPLANT
TUBE CONNECTING 20X1/4 (TUBING) ×6 IMPLANT
UNDERPAD 30X30 (UNDERPADS AND DIAPERS) ×3 IMPLANT
VALVE BIOPSY  SINGLE USE (MISCELLANEOUS) ×3
VALVE BIOPSY SINGLE USE (MISCELLANEOUS) ×2 IMPLANT
VALVE SUCTION BRONCHIO DISP (MISCELLANEOUS) ×3 IMPLANT
WATER STERILE IRR 1000ML POUR (IV SOLUTION) ×3 IMPLANT

## 2021-02-26 NOTE — Progress Notes (Signed)
02/26/2021  I have seen and evaluated the patient for abnormal imaging.  S:  Feels better after thoracentesis but still DOE. Mild catheter site pain post procedure self resolved. Feels unsteady on feet.  O: Blood pressure 135/69, pulse 74, temperature 98.3 F (36.8 C), temperature source Oral, resp. rate 17, height 6\' 1"  (1.854 m), weight 118.8 kg, SpO2 97 %.  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 Erin Fulling in case needs PleurX   Erskine Emery MD [facility] epic messenger for cross cover needs If after hours, please call E-link

## 2021-02-26 NOTE — Anesthesia Preprocedure Evaluation (Signed)
Anesthesia Evaluation  Patient identified by MRN, date of birth, ID band Patient awake    Reviewed: Allergy & Precautions, NPO status , Patient's Chart, lab work & pertinent test results  History of Anesthesia Complications (+) PONV and history of anesthetic complications  Airway Mallampati: III  TM Distance: >3 FB Neck ROM: Full    Dental no notable dental hx. (+) Teeth Intact, Dental Advisory Given   Pulmonary shortness of breath and with exertion, sleep apnea and Continuous Positive Airway Pressure Ventilation , former smoker,  Pleural effusion s/p 1.8L thoracentesis yesterday Lung mass for bronch today  Quit smoking 2007, 34 pack year history Used nebulizer this morning    Pulmonary exam normal breath sounds clear to auscultation       Cardiovascular hypertension, Pt. on medications + CAD and + Cardiac Stents (2007)  Normal cardiovascular exam+ dysrhythmias Atrial Fibrillation  Rhythm:Regular Rate:Normal     Neuro/Psych PSYCHIATRIC DISORDERS Anxiety CVA, No Residual Symptoms    GI/Hepatic Neg liver ROS, GERD  Medicated and Controlled,  Endo/Other  Obesity BMI 35  Renal/GU negative Renal ROS  negative genitourinary   Musculoskeletal  (+) Arthritis , Osteoarthritis,    Abdominal   Peds negative pediatric ROS (+)  Hematology negative hematology ROS (+)   Anesthesia Other Findings   Reproductive/Obstetrics negative OB ROS                             Anesthesia Physical Anesthesia Plan  ASA: III  Anesthesia Plan: General   Post-op Pain Management:    Induction: Intravenous  PONV Risk Score and Plan: 3 and Ondansetron, Dexamethasone, Treatment may vary due to age or medical condition and Scopolamine patch - Pre-op  Airway Management Planned: Oral ETT and Video Laryngoscope Planned  Additional Equipment: None  Intra-op Plan:   Post-operative Plan: Extubation in  OR  Informed Consent: I have reviewed the patients History and Physical, chart, labs and discussed the procedure including the risks, benefits and alternatives for the proposed anesthesia with the patient or authorized representative who has indicated his/her understanding and acceptance.     Dental advisory given  Plan Discussed with: CRNA  Anesthesia Plan Comments:         Anesthesia Quick Evaluation

## 2021-02-26 NOTE — Anesthesia Procedure Notes (Signed)
Procedure Name: Intubation Date/Time: 02/26/2021 1:41 PM Performed by: Georgia Duff, CRNA Pre-anesthesia Checklist: Patient identified, Emergency Drugs available, Suction available and Patient being monitored Patient Re-evaluated:Patient Re-evaluated prior to induction Oxygen Delivery Method: Circle System Utilized Preoxygenation: Pre-oxygenation with 100% oxygen Induction Type: IV induction Ventilation: Mask ventilation without difficulty Laryngoscope Size: Miller and 3 Grade View: Grade I Tube type: Oral Tube size: 8.5 mm Number of attempts: 1 Airway Equipment and Method: Stylet and Oral airway Placement Confirmation: ETT inserted through vocal cords under direct vision,  positive ETCO2 and breath sounds checked- equal and bilateral Secured at: 24 cm Tube secured with: Tape Dental Injury: Teeth and Oropharynx as per pre-operative assessment

## 2021-02-26 NOTE — Progress Notes (Signed)
Pt. States he can place cpap on himself when ready. RT informed pt. To notify if he needs any assistance.

## 2021-02-26 NOTE — Plan of Care (Signed)

## 2021-02-26 NOTE — Transfer of Care (Signed)
Immediate Anesthesia Transfer of Care Note  Patient: Corey [name]  Procedure(s) Performed: VIDEO BRONCHOSCOPY WITH ENDOBRONCHIAL ULTRASOUND (Left ) FINE NEEDLE ASPIRATION (FNA) LINEAR  Patient Location: PACU  Anesthesia Type:General  Level of Consciousness: awake, drowsy and patient cooperative  Airway & Oxygen Therapy: Patient Spontanous Breathing  Post-op Assessment: Report given to RN and Post -op Vital signs reviewed and stable  Post vital signs: Reviewed and stable  Last Vitals:  Vitals Value Taken Time  BP 131/63 02/26/21 1439  Temp 36.5 C 02/26/21 1439  Pulse 72 02/26/21 1444  Resp 18 02/26/21 1444  SpO2 90 % 02/26/21 1444  Vitals shown include unvalidated device data.  Last Pain:  Vitals:   02/26/21 1439  TempSrc: Temporal  PainSc: 0-No pain      Patients Stated Pain Goal: 4 (36/01/65 8006)  Complications: No complications documented.

## 2021-02-26 NOTE — Op Note (Signed)
EBUS Bronchoscopy Procedure Note  NAHIEM DREDGE  [id number]  1946/08/02  Date:02/26/21  Time:2:27 PM   Provider Performing:Azyiah Bo C Tamala Julian   Procedure: Flexible bronchoscopy and EBUS Bronchoscopy  Indication(s) Lung mass  Consent Risks of the procedure as well as the alternatives and risks of each were explained to the patient and/or caregiver.  Consent for the procedure was obtained.  Anesthesia General Anesthesia   Time Out Verified patient identification, verified procedure, site/side was marked, verified correct patient position, special equipment/implants available, medications/allergies/relevant history reviewed, required imaging and test results available.   Sterile Technique Usual hand hygiene, masks, gowns, and gloves were used   Procedure Description Diagnostic bronchoscope advanced through endotracheal tube and into airway.  Airways were examined down to subsegmental level with findings noted below.   The diagnostic bronchoscope was then removed and the EBUS bronchoscope was advanced into airway with station 7 biopsied and sent for slide, cell block, and/or culture.  The EBUS bronchoscope was removed after assuring no active bleeding from biopsy site.  Findings:  Complete obstruction of LLL with tumor, some endobronchial invasion Enlarged subcarinal node biopsied   Complications/Tolerance None; patient tolerated the procedure well. Chest X-ray is not needed post procedure.   EBL Minimal   Specimen(s) EBUS 7 (3 slides, 4 cell block)

## 2021-02-26 NOTE — Progress Notes (Signed)
Brief oncology note:  Stopped by to visit with the patient and his wife.  He just returned from bronchoscopy.  I let him know that our office is aware of the referral for him to our office.  We we will likely not have his biopsy results back until sometime next week.  I have sent an ambulatory referral to oncology to arrange outpatient follow-up in our office next week.  I have also made the thoracic RN navigator aware of this patient.  The patient should expect a call from our office with the appointment date and time.  [name], DNP, AGPCNP-BC, AOCNP

## 2021-02-26 NOTE — Anesthesia Postprocedure Evaluation (Signed)
Anesthesia Post Note  Patient: Corey [name]  Procedure(s) Performed: VIDEO BRONCHOSCOPY WITH ENDOBRONCHIAL ULTRASOUND (Left ) FINE NEEDLE ASPIRATION (FNA) LINEAR     Patient location during evaluation: PACU Anesthesia Type: General Level of consciousness: awake and alert, oriented and patient cooperative Pain management: pain level controlled Vital Signs Assessment: post-procedure vital signs reviewed and stable Respiratory status: spontaneous breathing, nonlabored ventilation and respiratory function stable Cardiovascular status: blood pressure returned to baseline and stable Postop Assessment: no apparent nausea or vomiting Anesthetic complications: no   No complications documented.  Last Vitals:  Vitals:   02/26/21 1439 02/26/21 1449  BP: 131/63 (!) 101/57  Pulse: 71 75  Resp: 20 19  Temp: 36.5 C   SpO2: 93% 95%    Last Pain:  Vitals:   02/26/21 1449  TempSrc:   PainSc: 0-No pain                 Pervis Hocking

## 2021-02-26 NOTE — Progress Notes (Signed)
PROGRESS NOTE    Corey [name]  IRW:[id number] DOB: January 31, 1946 DOA: 02/24/2021 PCP: [name], MD   Brief Narrative:  HPI on 5/24/202 by Dr. Wynetta Fines Corey [name] is a 75 y.o. male with medical history significant of prostate cancer stage T2C, status post radiation therapy, paroxysmal A. 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  Symptoms started 2 to 3 months ago, gradually getting worse.  Initially with mild exertional dyspnea with some left-sided chest discomfort, cannot take deep breath.  Productive cough with some thin clear phlegm, no blood.  Few weeks ago, symptoms worsened with more constant cough and wheezing, went to see allergist who diagnosed him with asthma started him on as needed albuterol, which he tried with some relief.  However, breathing symptoms continued to get worse, no walking short distances within the house can cause severe shortness of breath.  And the left-sided chest pain " becomes more frequent and across to the right side" denies any fever chills, he estimated he lost few pounds recently and with constantly feeling fatigue.  Was diagnosed with prostate cancer June last year, underwent radiation therapy for 4 rounds ended December 2021, underwent PET scan last year which showed no metastatic sites.  Interim history Patient found to have large left-sided pleural effusion on x-ray.  CT chest without contrast also showed a large left-sided pleural effusion with questionable infrahilar mass extending to the left lower lobe.  Plan for thoracentesis and pulmonology consultation.  Assessment & Plan   Dyspnea secondary to large left-sided pleural effusion/infrahilar mass -Patient presented with several months of shortness of breath however it worsened over the last several days where he was unable to walk around the house without feeling short of breath and having to stop.-CT chest abdomen  pelvis without contrast showed new large left pleural effusion with subtotal atelectasis of the left lower lobe and partial of the left upper lobe.  New azygoesophageal recess adenopathy.  Suspicion of a left infrahilar mass extending into the left lower lobe medially, concerning for neoplasm -Pulmonology consulted and appreciated, s/p thoracentesis with 1500 cc of fluid removed.  Fluid culture pending however does appear to be exudative -Status post bronchoscopy today -Pending ambulatory oxygen saturation -Xarelto held -Of note patient does have history of prostate cancer -Discussed with pulmonology, Dr. Tamala Julian, oncology has been consulted  Paroxysmal atrial fibrillation -Xarelto currently held -Currently rate controlled -Continue Tikosyn, Cardizem  Prostate cancer -Patient follows with Dr. Junious Silk, urology -Status post radiation therapy -Continue outpatient follow-up  Asthma -Continue current breathing treatments  OSA -Continue CPAP nightly   DVT Prophylaxis Lovenox  Code Status: Full  Family Communication: None at bedside  Disposition Plan:  Status is: Inpatient  Remains inpatient appropriate because:Ongoing diagnostic testing needed not appropriate for outpatient work up and Inpatient level of care appropriate due to severity of illness.  Pending oncology consultation   Dispo: The patient is from: Home              Anticipated d/c is to: Home              Patient currently is not medically stable to d/c.   Difficult to place patient No   Consultants Pulmonology  Procedures  None  Antibiotics   Anti-infectives (From admission, onward)   None      Subjective:   Corey [name] seen and examined today.  Feels shortness of breath has improved minimally.  Able to move around  more.  Denies current cough, abdominal pain, dizziness or headache.  Does complain of pain on the left side of his chest but does not want to take pain medications.  States pain is worse with  movement.   Objective:   Vitals:   02/26/21 0758 02/26/21 1004 02/26/21 1243 02/26/21 1439  BP:  116/77 135/69 131/63  Pulse:  79 74 71  Resp:  14 17 20   Temp:  97.9 F (36.6 C) 98.3 F (36.8 C) (P) 97.7 F (36.5 C)  TempSrc:  Oral Oral (P) Temporal  SpO2: 99%  97% 93%  Weight:   118.8 kg   Height:   6\' 1"  (1.854 m)     Intake/Output Summary (Last 24 hours) at 02/26/2021 1441 Last data filed at 02/26/2021 1422 Gross per 24 hour  Intake 500 ml  Output 850 ml  Net -350 ml   Filed Weights   02/24/21 1018 02/26/21 1243  Weight: 118.8 kg 118.8 kg    Exam  General: Well developed, well nourished, NAD, appears stated age  HEENT: NCAT, mucous membranes moist.   Cardiovascular: S1 S2 auscultated, RRR.  Respiratory: Diminished breath sounds on the left however improving.  Otherwise clear.  No wheezing  Abdomen: Soft, nontender, nondistended, + bowel sounds  Extremities: warm dry without cyanosis clubbing or edema  Neuro: AAOx3, nonfocal  Psych: pleasant, appropriate mood and affect   Data Reviewed: I have personally reviewed following labs and imaging studies  CBC: Recent Labs  Lab 02/24/21 1020 02/25/21 0218 02/26/21 0254  WBC 5.5 5.9 6.1  HGB 12.1* 11.6* 11.9*  HCT 39.0 36.8* 37.2*  MCV 95.4 94.4 93.9  PLT 326 289 448   Basic Metabolic Panel: Recent Labs  Lab 02/24/21 1020 02/25/21 1518 02/26/21 0254  NA 136 133* 136  K 4.0 4.5 4.8  CL 104 103 104  CO2 23 25 25   GLUCOSE 119* 115* 118*  BUN 16 17 16   CREATININE 1.03 0.98 1.02  CALCIUM 9.4 9.3 9.5  MG  --  1.8  --    GFR: Estimated Creatinine Corey: 87.1 mL/min (by C-G formula based on SCr of 1.02 mg/dL). Liver Function Tests: Recent Labs  Lab 02/25/21 1518  PROT 6.4*   No results for input(s): LIPASE, AMYLASE in the last 168 hours. No results for input(s): AMMONIA in the last 168 hours. Coagulation Profile: No results for input(s): INR, PROTIME in the last 168 hours. Cardiac  Enzymes: No results for input(s): CKTOTAL, CKMB, CKMBINDEX, TROPONINI in the last 168 hours. BNP (last 3 results) No results for input(s): PROBNP in the last 8760 hours. HbA1C: No results for input(s): HGBA1C in the last 72 hours. CBG: No results for input(s): GLUCAP in the last 168 hours. Lipid Profile: No results for input(s): CHOL, HDL, LDLCALC, TRIG, CHOLHDL, LDLDIRECT in the last 72 hours. Thyroid Function Tests: No results for input(s): TSH, T4TOTAL, FREET4, T3FREE, THYROIDAB in the last 72 hours. Anemia Panel: No results for input(s): VITAMINB12, FOLATE, FERRITIN, TIBC, IRON, RETICCTPCT in the last 72 hours. Urine analysis:    Component Value Date/Time   COLORURINE AMBER (A) 06/04/2016 1304   APPEARANCEUR CLEAR 06/04/2016 1304   LABSPEC 1.019 06/04/2016 1304   PHURINE 5.5 06/04/2016 1304   GLUCOSEU NEGATIVE 06/04/2016 1304   HGBUR NEGATIVE 06/04/2016 1304   BILIRUBINUR NEGATIVE 06/04/2016 1304   KETONESUR NEGATIVE 06/04/2016 1304   PROTEINUR NEGATIVE 06/04/2016 1304   NITRITE NEGATIVE 06/04/2016 1304   LEUKOCYTESUR SMALL (A) 06/04/2016 1304   Sepsis Labs: @LABRCNTIP (procalcitonin:4,lacticidven:4)  )  Recent Results (from the past 240 hour(s))  Resp Panel by RT-PCR (Flu A&B, Covid) Nasopharyngeal Swab     Status: None   Collection Time: 02/24/21  2:08 PM   Specimen: Nasopharyngeal Swab; Nasopharyngeal(NP) swabs in vial transport medium  Result Value Ref Range Status   SARS Coronavirus 2 by RT PCR NEGATIVE NEGATIVE Final    Comment: (NOTE) SARS-CoV-2 target nucleic acids are NOT DETECTED.  The SARS-CoV-2 RNA is generally detectable in upper respiratory specimens during the acute phase of infection. The lowest concentration of SARS-CoV-2 viral copies this assay can detect is 138 copies/mL. A negative result does not preclude SARS-Cov-2 infection and should not be used as the sole basis for treatment or other patient management decisions. A negative result may occur  with  improper specimen collection/handling, submission of specimen other than nasopharyngeal swab, presence of viral mutation(s) within the areas targeted by this assay, and inadequate number of viral copies(<138 copies/mL). A negative result must be combined with clinical observations, patient history, and epidemiological information. The expected result is Negative.  Fact Sheet for Patients:  EntrepreneurPulse.com.au  Fact Sheet for Healthcare Providers:  IncredibleEmployment.be  This test is no t yet approved or cleared by the [place] FDA and  has been authorized for detection and/or diagnosis of SARS-CoV-2 by FDA under an Emergency Use Authorization (EUA). This EUA will remain  in effect (meaning this test can be used) for the duration of the COVID-19 declaration under Section 564(b)(1) of the Act, 21 U.S.C.section 360bbb-3(b)(1), unless the authorization is terminated  or revoked sooner.       Influenza A by PCR NEGATIVE NEGATIVE Final   Influenza B by PCR NEGATIVE NEGATIVE Final    Comment: (NOTE) The Xpert Xpress SARS-CoV-2/FLU/RSV plus assay is intended as an aid in the diagnosis of influenza from Nasopharyngeal swab specimens and should not be used as a sole basis for treatment. Nasal washings and aspirates are unacceptable for Xpert Xpress SARS-CoV-2/FLU/RSV testing.  Fact Sheet for Patients: EntrepreneurPulse.com.au  Fact Sheet for Healthcare Providers: IncredibleEmployment.be  This test is not yet approved or cleared by the [place] FDA and has been authorized for detection and/or diagnosis of SARS-CoV-2 by FDA under an Emergency Use Authorization (EUA). This EUA will remain in effect (meaning this test can be used) for the duration of the COVID-19 declaration under Section 564(b)(1) of the Act, 21 U.S.C. section 360bbb-3(b)(1), unless the authorization is terminated  or revoked.  Performed at [facility], [place] [address] [place], [place] [address]   Body fluid culture w Gram Stain     Status: None (Preliminary result)   Collection Time: 02/25/21  2:47 PM   Specimen: Pleura  Result Value Ref Range Status   Specimen Description PLEURAL FLUID  Final   Special Requests NONE  Final   Gram Stain   Final    FEW WBC PRESENT, PREDOMINANTLY MONONUCLEAR NO ORGANISMS SEEN    Culture   Final    NO GROWTH < 24 HOURS Performed at [facility], [place] [address] [place], [place] [address]    Report Status PENDING  Incomplete  Surgical pcr screen     Status: None   Collection Time: 02/26/21 10:18 AM   Specimen: Nasal Mucosa; Nasal Swab  Result Value Ref Range Status   MRSA, PCR NEGATIVE NEGATIVE Final   Staphylococcus aureus NEGATIVE NEGATIVE Final    Comment: (NOTE) The Xpert SA Assay (FDA approved for NASAL specimens in patients 94 years of age  and older), is one component of a comprehensive surveillance program. It is not intended to diagnose infection nor to guide or monitor treatment. Performed at [facility], [place] [address], [place], [place] [address]       Radiology Studies: DG Chest Port 1 View  Result Date: 02/25/2021 CLINICAL DATA:  Left pleural effusion and status post bedside thoracentesis. EXAM: PORTABLE CHEST 1 VIEW COMPARISON:  02/24/2021 FINDINGS: Stable heart size and appearance of implanted loop recorder. Significant decrease in volume of left pleural fluid after thoracentesis with probable small volume of fluid remaining. Underlying atelectasis/airspace disease of the left lower lung. No pneumothorax. No pulmonary edema. IMPRESSION: Significant decrease in left pleural fluid volume after thoracentesis. No pneumothorax. Underlying atelectasis/airspace disease of the left lower lung. Electronically Signed   By: Aletta Edouard M.D.   On: 02/25/2021 16:51   ECHOCARDIOGRAM COMPLETE  Result Date: 02/25/2021     ECHOCARDIOGRAM REPORT   Patient Name:   Corey [name] Date of Exam: 02/25/2021 Medical Rec #:  [id number]   Height:       73.0 in Accession #:    [id number]  Weight:       262.0 lb Date of Birth:  [birth year]  BSA:          2.413 m Patient Age:    33 years    BP:           131/80 mmHg Patient Gender: M           HR:           67 bpm. Exam Location:  Inpatient Procedure: 2D Echo, Cardiac Doppler and Color Doppler Indications:    CHF  History:        Patient has prior history of Echocardiogram examinations, most                 recent 07/19/2019. CAD, Arrythmias:Atrial Fibrillation; Risk                 Factors:Hypertension, Former Smoker and Dyslipidemia.  Sonographer:    Cammy Brochure Referring Phys: [id number] Lequita Halt  Sonographer Comments: Technically difficult study due to poor echo windows. Image acquisition challenging due to patient body habitus. IMPRESSIONS  1. Left ventricular ejection fraction, by estimation, is 60 to 65%. The left ventricle has normal function. The left ventricle has no regional wall motion abnormalities. Left ventricular diastolic parameters were normal.  2. Right ventricular systolic function is normal. The right ventricular size is normal.  3. Large pleural effusion in the left lateral region.  4. The mitral valve is normal in structure. No evidence of mitral valve regurgitation. No evidence of mitral stenosis.  5. The aortic valve is normal in structure. Aortic valve regurgitation is not visualized. No aortic stenosis is present.  6. The inferior vena cava is dilated in size with >50% respiratory variability, suggesting right atrial pressure of 8 mmHg. Comparison(s): No significant cardiac change from prior study. Prior images reviewed side by side. There is a new large left pleural effusion with large nodular pleural based masses. FINDINGS  Left Ventricle: Left ventricular ejection fraction, by estimation, is 60 to 65%. The left ventricle has normal function. The left ventricle has  no regional wall motion abnormalities. The left ventricular internal cavity size was normal in size. There is  no left ventricular hypertrophy. Left ventricular diastolic parameters were normal. Right Ventricle: The right ventricular size is normal. No increase in right ventricular wall thickness. Right ventricular systolic  function is normal. Left Atrium: Left atrial size was normal in size. Right Atrium: Right atrial size was normal in size. Pericardium: There is no evidence of pericardial effusion. Mitral Valve: The mitral valve is normal in structure. No evidence of mitral valve regurgitation. No evidence of mitral valve stenosis. Tricuspid Valve: The tricuspid valve is normal in structure. Tricuspid valve regurgitation is not demonstrated. No evidence of tricuspid stenosis. Aortic Valve: The aortic valve is normal in structure. Aortic valve regurgitation is not visualized. No aortic stenosis is present. Aortic valve mean gradient measures 5.0 mmHg. Aortic valve peak gradient measures 9.2 mmHg. Aortic valve area, by VTI measures 3.69 cm. Pulmonic Valve: The pulmonic valve was normal in structure. Pulmonic valve regurgitation is not visualized. No evidence of pulmonic stenosis. Aorta: The aortic root is normal in size and structure. Venous: The inferior vena cava is dilated in size with greater than 50% respiratory variability, suggesting right atrial pressure of 8 mmHg. IAS/Shunts: No atrial level shunt detected by color flow Doppler. Additional Comments: There is a large pleural effusion in the left lateral region.  LEFT VENTRICLE PLAX 2D LVIDd:         5.70 cm  Diastology LVIDs:         3.80 cm  LV e' medial:    12.62 cm/s LV PW:         1.10 cm  LV E/e' medial:  8.6 LV IVS:        1.20 cm  LV e' lateral:   8.00 cm/s LVOT diam:     2.40 cm  LV E/e' lateral: 13.5 LV SV:         107 LV SV Index:   44 LVOT Area:     4.52 cm  RIGHT VENTRICLE            IVC RV S prime:     8.38 cm/s  IVC diam: 2.80 cm LEFT ATRIUM          Index LA diam:    3.00 cm 1.24 cm/m  AORTIC VALVE AV Area (Vmax):    4.20 cm AV Area (Vmean):   3.88 cm AV Area (VTI):     3.69 cm AV Vmax:           152.00 cm/s AV Vmean:          102.000 cm/s AV VTI:            0.289 m AV Peak Grad:      9.2 mmHg AV Mean Grad:      5.0 mmHg LVOT Vmax:         141.00 cm/s LVOT Vmean:        87.500 cm/s LVOT VTI:          0.236 m LVOT/AV VTI ratio: 0.82  AORTA Ao Root diam: 3.80 cm Ao Asc diam:  3.60 cm MITRAL VALVE MV Area (PHT): 3.31 cm     SHUNTS MV Decel Time: 229 msec     Systemic VTI:  0.24 m MV E velocity: 108.00 cm/s  Systemic Diam: 2.40 cm MV A velocity: 87.50 cm/s MV E/A ratio:  1.23 Mihai Croitoru MD Electronically signed by Sanda Klein MD Signature Date/Time: 02/25/2021/2:06:32 PM    Final      Scheduled Meds: . [MAR Hold] acidophilus  1 capsule Oral Daily  . [MAR Hold] atorvastatin  40 mg Oral Daily  . [MAR Hold] azelastine  2 spray Each Nare BID  . [MAR Hold] benzonatate  200 mg  Oral TID  . [MAR Hold] budesonide (PULMICORT) nebulizer solution  0.5 mg Nebulization BID  . [MAR Hold] diclofenac Sodium  4 g Topical QID  . [MAR Hold] diltiazem  30 mg Oral Q6H  . [MAR Hold] dofetilide  500 mcg Oral BID  . [MAR Hold] enoxaparin (LOVENOX) injection  40 mg Subcutaneous Q24H  . [MAR Hold] feeding supplement  237 mL Oral BID BM  . [MAR Hold] fenofibrate  54 mg Oral Daily  . [MAR Hold] fluticasone  2 spray Each Nare Daily  . [MAR Hold] guaiFENesin  1,200 mg Oral BID  . [MAR Hold] loratadine  10 mg Oral QPM  . [MAR Hold] magnesium oxide  400 mg Oral Daily  . [MAR Hold] metoprolol succinate  25 mg Oral Daily  . [MAR Hold] multivitamin with minerals  1 tablet Oral Daily  . [MAR Hold] pantoprazole  40 mg Oral Daily  . [MAR Hold] potassium chloride  10 mEq Oral Daily  . scopolamine  1 patch Transdermal Q72H  . [MAR Hold] tamsulosin  0.4 mg Oral Daily  . [MAR Hold] topiramate  100 mg Oral BID  . [MAR Hold] Vitamin D (Ergocalciferol)  50,000  Units Oral Q Fri   Continuous Infusions: . lactated ringers 10 mL/hr at 02/26/21 1306     LOS: 2 days   Time Spent in minutes   45 minutes  Kitti Mcclish D.O. on 02/26/2021 at 2:41 PM  Between 7am to 7pm - Please see pager noted on [email or web address]  After 7pm go to [email or web address]  And look for the night coverage person covering for me after hours  [facility] Office  [phone]

## 2021-02-26 NOTE — Progress Notes (Signed)
Physical Therapy Evaluation Patient Details Name: Corey [name] MRN: [id number] DOB: 1945-12-23 Today's Date: 02/26/2021   History of Present Illness  Corey is 75 y/o male presenting 5/24 with increasing SOB. 5/25, Thoracentesis netting approx 1500cc's and 5/26 s/p flexible bronchoscopy.  PMHx:  Prostate Cancer, s/p rx therapy, HTN, lumbar radiculopathy, OSA,  Clinical Impression  Corey admitted with/for worsening SOB, with interventions described above.  Corey needing min guard assist mobilizing OOB/gait due to weakness and mild instabilit.  Corey currently limited functionally due to the problems listed below.  (see problems list.)  Corey will benefit from Corey to maximize function and safety to be able to get home safely with available assist .     Follow Up Recommendations No Corey follow up;Supervision - Intermittent    Equipment Recommendations  None recommended by Corey    Recommendations for Other [name]       Precautions / Restrictions Precautions Precautions: Fall      Mobility  Bed Mobility Overal bed mobility: Modified Independent                  Transfers Overall transfer level: Needs assistance   Transfers: Sit to/from Stand Sit to Stand: Supervision         General transfer comment: mild instability once in standing  Ambulation/Gait Ambulation/Gait assistance: Min guard;Min assist (min once fatigued and lightheaded) Gait Distance (Feet): 250 Feet Assistive device: Straight cane Gait Pattern/deviations: Step-through pattern Gait velocity: slower to moderate Gait velocity interpretation: 1.31 - 2.62 ft/sec, indicative of limited community ambulator General Gait Details: mild instability due to R LE weakness (radiculopathy).  Appropriate use of the SPC. Moderate to slower gait speed.  3 standing rests with recording of SpO2 at 88%, 81% and 91%, respectively on RA, with larger drop occurring with longer distance and corresponding fatigue/dyspnea.  EHR in the upper 90's to  upper 100's.  Stairs            Wheelchair Mobility    Modified Rankin (Stroke Patients Only)       Balance Overall balance assessment: Needs assistance Sitting-balance support: No upper extremity supported;Feet supported Sitting balance-Leahy Scale: Good     Standing balance support: No upper extremity supported;Single extremity supported Standing balance-Leahy Scale: Fair                               Pertinent Vitals/Pain Pain Assessment: No/denies pain    Home Living Family/patient expects to be discharged to:: Private residence Living Arrangements: Spouse/significant other Available Help at Discharge: Family;Available PRN/intermittently Type of Home: House Home Access: Stairs to enter Entrance Stairs-Rails: Psychiatric nurse of Steps: seveeral Home Layout: Two level;Bed/bath upstairs Home Equipment: Cane - single point      Prior Function Level of Independence: Independent with assistive device(s)               Hand Dominance        Extremity/Trunk Assessment   Upper Extremity Assessment Upper Extremity Assessment: Overall WFL for tasks assessed    Lower Extremity Assessment Lower Extremity Assessment: Overall WFL for tasks assessed (bil proximal weakness R>L LE, general weakness)    Cervical / Trunk Assessment Cervical / Trunk Assessment: Normal  Communication   Communication: No difficulties  Cognition Arousal/Alertness: Awake/alert Behavior During Therapy: WFL for tasks assessed/performed Overall Cognitive Status: Within Functional Limits for tasks assessed  General Comments General comments (skin integrity, edema, etc.): SpO2 dropped to as low as 81% on RA during gait with dyspnea and need to stop and rest.  EHR rose into the 100's.  Need to see if supplemental O2 can make an appropriate difference.    Exercises     Assessment/Plan    Corey  Assessment Patient needs continued Corey [name]  Corey Problem List Decreased strength;Decreased activity tolerance;Decreased balance;Decreased mobility;Cardiopulmonary status limiting activity;Pain       Corey Treatment Interventions Gait training;Stair training;Functional mobility training;Therapeutic activities;Balance training;Patient/family education    Corey Goals (Current goals can be found in the Care Plan section)  Acute Rehab Corey Goals Patient Stated Goal: be able to be independent and have energy to do what I want to do. Corey Goal Formulation: With patient Time For Goal Achievement: 03/05/21    Frequency Min 3X/week   Barriers to discharge        Co-evaluation               AM-PAC Corey "6 Clicks" Mobility  Outcome Measure Help needed turning from your back to your side while in a flat bed without using bedrails?: None Help needed moving from lying on your back to sitting on the side of a flat bed without using bedrails?: None Help needed moving to and from a bed to a chair (including a wheelchair)?: A Little Help needed standing up from a chair using your arms (e.g., wheelchair or bedside chair)?: A Little Help needed to walk in hospital room?: A Little Help needed climbing 3-5 steps with a railing? : A Little 6 Click Score: 20    End of Session   Activity Tolerance: Patient tolerated treatment well;Patient limited by fatigue Patient left: in bed;with call bell/phone within reach;with nursing/sitter in room;with family/visitor present Nurse Communication: Mobility status Corey Visit Diagnosis: Unsteadiness on feet (R26.81);Difficulty in walking, not elsewhere classified (R26.2)    Time: 2482-5003 Corey Time Calculation (min) (ACUTE ONLY): 27 min   Charges:   Corey Evaluation $Corey Eval Moderate Complexity: 1 Mod Corey Treatments $Gait Training: 8-22 mins        02/26/2021  Corey Carne., Corey [name] [phone]  (pager) [phone]  (office)  Corey [name] 02/26/2021, 7:09 PM

## 2021-02-27 ENCOUNTER — Telehealth: Payer: Self-pay | Admitting: *Deleted

## 2021-02-27 DIAGNOSIS — J90 Pleural effusion, not elsewhere classified: Secondary | ICD-10-CM | POA: Diagnosis not present

## 2021-02-27 DIAGNOSIS — I480 Paroxysmal atrial fibrillation: Secondary | ICD-10-CM | POA: Diagnosis not present

## 2021-02-27 DIAGNOSIS — G4733 Obstructive sleep apnea (adult) (pediatric): Secondary | ICD-10-CM | POA: Diagnosis not present

## 2021-02-27 DIAGNOSIS — E440 Moderate protein-calorie malnutrition: Secondary | ICD-10-CM

## 2021-02-27 DIAGNOSIS — R918 Other nonspecific abnormal finding of lung field: Secondary | ICD-10-CM

## 2021-02-27 DIAGNOSIS — C61 Malignant neoplasm of prostate: Secondary | ICD-10-CM

## 2021-02-27 LAB — CYTOLOGY - NON PAP

## 2021-02-27 MED ORDER — TRAMADOL HCL 50 MG PO TABS: 50.0000 mg | ORAL_TABLET | Freq: Four times a day (QID) | ORAL | 0 refills | Status: DC | PRN

## 2021-02-27 MED ORDER — DICLOFENAC SODIUM 1 % EX GEL: 4.0000 g | Freq: Four times a day (QID) | CUTANEOUS | 0 refills | Status: DC

## 2021-02-27 MED ORDER — ENSURE ENLIVE PO LIQD: 1.0000 | Freq: Two times a day (BID) | ORAL | 0 refills | Status: AC

## 2021-02-27 MED ORDER — TRAMADOL HCL 50 MG PO TABS: 50.0000 mg | ORAL_TABLET | Freq: Four times a day (QID) | ORAL | Status: DC | PRN

## 2021-02-27 NOTE — Telephone Encounter (Signed)
I received referral on Corey [name] today. I called to schedule but was unable to reach. I did leave vm message with my name and phone number to call.

## 2021-02-27 NOTE — Telephone Encounter (Signed)
I received a message from [name] and called him back. I was unable to reach but then he called me. I updated him on his appt with Dr. [name] on 03/05/21.  He verbalized understanding of appt time and place.

## 2021-02-27 NOTE — Care Management Important Message (Signed)
Important Message  Patient Details  Name: Corey [name] MRN: [id number] Date of Birth: [birth year]   Medicare Important Message Given:  Yes     Simone Tuckey [address] 02/27/2021, 12:54 PM

## 2021-02-27 NOTE — Discharge Summary (Signed)
Physician Discharge Summary  CARLIN ATTRIDGE QZR:[id number] DOB: 14-Feb-1946 DOA: 02/24/2021  PCP: [name], MD  Admit date: 02/24/2021 Discharge date: 02/27/2021  Time spent: 45 minutes  Recommendations for Outpatient Follow-up:  Patient will be discharged to home.  Patient will need to follow up with primary care provider within one week of discharge.  Patient should continue medications as prescribed.  Follow up with oncology. Patient should follow a regular diet.   Discharge Diagnoses:  Dyspnea secondary to large left-sided pleural effusion/infrahilar mass Paroxysmal atrial fibrillation Prostate cancer Asthma OSA  Discharge Condition: Stable  Diet recommendation: Regular  Filed Weights   02/24/21 1018 02/26/21 1243  Weight: 118.8 kg 118.8 kg    History of present illness:  on 5/24/202 by Dr. Earlyne Iba Dixonis a 75 y.o.malewith medical history significant ofprostate cancer stageT2C,status post radiation therapy, paroxysmal A. 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  Symptoms started 2 to 3 months ago, gradually getting worse. Initially with mild exertional dyspnea with some left-sided chest discomfort, cannot take deep breath. Productive cough with some thin clearphlegm, no blood. Few weeks ago, symptoms worsenedwith more constant cough and wheezing, went to see allergist who diagnosed him with asthma started him on as needed albuterol,which he tried with some relief. However,breathing symptoms continued to get worse, no walking short distances within the house can cause severe shortness of breath. And the left-sided chest pain "becomes more frequent and across to the right side"denies any fever chills, he estimated he lost few pounds recently and withconstantly feelingfatigue.  Was diagnosed with prostate cancer June last year, underwent radiation therapy for 4 rounds  ended December 2021, underwent PET scan last year which showed no metastatic sites.  Hospital Course:  Dyspnea secondary to large left-sided pleural effusion/infrahilar mass -Patient presented with several months of shortness of breath however it worsened over the last several days where he was unable to walk around the house without feeling short of breath and having to stop.-CT chest abdomen pelvis without contrast showed new large left pleural effusion with subtotal atelectasis of the left lower lobe and partial of the left upper lobe.  New azygoesophageal recess adenopathy.  Suspicion of a left infrahilar mass extending into the left lower lobe medially, concerning for neoplasm -Pulmonology consulted and appreciated, s/p thoracentesis with 1500 cc of fluid removed.  Fluid culture pending however does appear to be exudative -Status post bronchoscopy- path pending and can be followed by oncology -Of note patient does have history of prostate cancer -Oncology consulted and appreciated- planning for outpatient follow up -Patient was able to walk around the unit with nursing, maintain oxygen saturations in the 90s -patient with pain- likely musculoskeletal.  Does not wish to use strong medications.  States he is used tramadol in the past which helps.  Paroxysmal atrial fibrillation -Xarelto currently held for procedures- will continue on discahrge -Currently rate controlled -Continue Tikosyn, Cardizem  Prostate cancer -Patient follows with Dr. Junious Silk, urology -Status post radiation therapy -Continue outpatient follow-up  Asthma -Continue current breathing treatments  OSA -Continue CPAP nightly  Consultants Pulmonology Oncology  Procedures  Image guided thoracentesis Flexible bronchoscopy and EBUS bronchoscopy  Discharge Exam: Vitals:   02/27/21 0705 02/27/21 0728  BP: 125/82   Pulse: 68   Resp: 20   Temp: 97.7 F (36.5 C)   SpO2: 97% 92%     General: Well  developed, well nourished, NAD, appears stated age  80: NCAT,  mucous membranes moist.   Cardiovascular: S1 S2 auscultated, RRR.  Respiratory: Diminished breath sounds on the left however improving.  Otherwise clear.  No wheezing  Abdomen: Soft, nontender, nondistended, + bowel sounds  Extremities: warm dry without cyanosis clubbing or edema  Neuro: AAOx3, nonfocal  Psych: pleasant, appropriate mood and affect   Discharge Instructions Discharge Instructions    Discharge instructions   Complete by: As directed    Patient will be discharged to home.  Patient will need to follow up with primary care provider within one week of discharge.  Patient should continue medications as prescribed.  Follow up with oncology. Patient should follow a regular diet.   Increase activity slowly   Complete by: As directed      Allergies as of 02/27/2021      Reactions   Amoxicillin Swelling   Fosinopril Other (See Comments)   Hydromorphone Other (See Comments), Nausea Only   Ampicillin Rash   Codeine Nausea Only, Other (See Comments)   Heavy amounts cause nausea   Monopril [fosinopril Sodium] Other (See Comments)   Muscle aches & pain   Rosuvastatin    Percocet [oxycodone-acetaminophen] Nausea And Vomiting      Medication List    TAKE these medications   albuterol 108 (90 Base) MCG/ACT inhaler Commonly known as: VENTOLIN HFA Inhale 2 puffs into the lungs every 4 hours as needed What changed:   how much to take  how to take this  when to take this  reasons to take this  additional instructions   atorvastatin 40 MG tablet Commonly known as: LIPITOR Take 1 tablet (40 mg total) by mouth daily.   azelastine 0.1 % nasal spray Commonly known as: ASTELIN Place 2 sprays into both nostrils 2 (two) times daily.   clindamycin 300 MG capsule Commonly known as: CLEOCIN Take 300 mg by mouth See admin instructions. Uses for dental procedures   diclofenac Sodium 1 % Gel Commonly  known as: VOLTAREN Apply 4 g topically 4 (four) times daily.   dicyclomine 20 MG tablet Commonly known as: BENTYL Take 20 mg by mouth as needed for spasms.   diltiazem 30 MG tablet Commonly known as: Cardizem Take 1 tablet (30 mg total) by mouth 4 (four) times daily as needed. What changed: reasons to take this   dofetilide 500 MCG capsule Commonly known as: TIKOSYN Take 1 capsule (500 mcg total) by mouth 2 (two) times daily. Please keep upcoming appointment in February for future refills. Thank you   ergocalciferol 1.25 MG (50000 UT) capsule Commonly known as: VITAMIN D2 Take 1 capsule (50,000 Units total) by mouth once a week. What changed: additional instructions   feeding supplement Liqd Take 237 mLs by mouth 2 (two) times daily between meals.   fenofibrate 160 MG tablet TAKE 1 TABLET DAILY. PLEASE KEEP UPCOMING APPT IN MARCH WITH DR. Acie Fredrickson BEFORE ANYMORE REFILLS. What changed:   how much to take  how to take this  when to take this  additional instructions   fluticasone 110 MCG/ACT inhaler Commonly known as: FLOVENT HFA Inhale 2 puffs into the lungs in the morning and at bedtime. with spacer and rinse mouth afterwards.   fluticasone 50 MCG/ACT nasal spray Commonly known as: FLONASE Place 2 sprays into both nostrils daily.   leuprolide (6 Month) 45 MG injection Commonly known as: ELIGARD Inject 45 mg into the skin every 6 (six) months.   levocetirizine 5 MG tablet Commonly known as: XYZAL Take 5 mg by mouth every  evening.   loperamide 2 MG capsule Commonly known as: IMODIUM Take 2 mg by mouth as needed for diarrhea or loose stools.   magnesium oxide 400 MG tablet Commonly known as: MAG-OX TAKE 1 TABLET BY MOUTH EVERY DAY   metoprolol succinate 25 MG 24 hr tablet Commonly known as: Toprol XL Take 1 tablet (25 mg total) by mouth daily.   multivitamins ther. w/minerals Tabs tablet Take 1 tablet by mouth daily.   nitroGLYCERIN 0.4 MG SL  tablet Commonly known as: NITROSTAT Place 1 tablet (0.4 mg total) under the tongue every 5 (five) minutes as needed for chest pain.   omeprazole 10 MG capsule Commonly known as: PRILOSEC TAKE 1 CAPSULE DAILY. What changed:   how much to take  how to take this  when to take this  additional instructions   oxybutynin 5 MG tablet Commonly known as: DITROPAN Take 1 tablet by mouth 2 (two) times daily as needed for bladder spasms.   potassium chloride 10 MEQ tablet Commonly known as: KLOR-CON Take 1 tablet (10 mEq total) by mouth daily.   Probiotic 250 MG Caps Take 1 capsule by mouth daily.   tamsulosin 0.4 MG Caps capsule Commonly known as: FLOMAX Take 0.4 mg by mouth daily.   topiramate 50 MG tablet Commonly known as: TOPAMAX Take 2 tablets (100 mg total) by mouth 2 (two) times daily.   traMADol 50 MG tablet Commonly known as: ULTRAM Take 1 tablet (50 mg total) by mouth every 6 (six) hours as needed for moderate pain.   traZODone 50 MG tablet Commonly known as: DESYREL Take 0.5 tablets (25 mg total) by mouth at bedtime as needed for sleep.   Tylenol 8 Hour Arthritis Pain 650 MG CR tablet Generic drug: acetaminophen Take 1,300 mg by mouth as needed for pain (pain).   Xarelto 20 MG Tabs tablet Generic drug: rivaroxaban TAKE 1 TABLET BY MOUTH EVERY DAY What changed:   how much to take  when to take this      Allergies  Allergen Reactions  . Amoxicillin Swelling  . Fosinopril Other (See Comments)  . Hydromorphone Other (See Comments) and Nausea Only  . Ampicillin Rash  . Codeine Nausea Only and Other (See Comments)    Heavy amounts cause nausea  . Monopril [Fosinopril Sodium] Other (See Comments)    Muscle aches & pain  . Rosuvastatin   . Percocet [Oxycodone-Acetaminophen] Nausea And Vomiting    Follow-up Information    Koirala, Dibas, MD. Schedule an appointment as soon as possible for a visit in 1 week(s).   Specialty: Family Medicine Why:  Hospital follow-up Contact information: [address] [place] [address] [phone]        Nahser, Wonda Cheng, MD .   Specialty: Cardiology Contact information: [place] [address] [phone]                The results of significant diagnostics from this hospitalization (including imaging, microbiology, ancillary and laboratory) are listed below for reference.    Significant Diagnostic Studies: DG Chest 2 View  Result Date: 02/24/2021 CLINICAL DATA:  Chest pain, shortness of breath. EXAM: CHEST - 2 VIEW COMPARISON:  September 04, 2020. FINDINGS: Stable cardiomegaly. No pneumothorax is noted. Right lung is clear. Interval development of moderate to large left pleural effusion is noted with associated left basilar atelectasis. Bony thorax is unremarkable. IMPRESSION: Interval development of moderate to large left pleural effusion with associated left basilar  atelectasis. Electronically Signed   By: Marijo Conception M.D.   On: 02/24/2021 11:04   DG Chest Port 1 View  Result Date: 02/25/2021 CLINICAL DATA:  Left pleural effusion and status post bedside thoracentesis. EXAM: PORTABLE CHEST 1 VIEW COMPARISON:  02/24/2021 FINDINGS: Stable heart size and appearance of implanted loop recorder. Significant decrease in volume of left pleural fluid after thoracentesis with probable small volume of fluid remaining. Underlying atelectasis/airspace disease of the left lower lung. No pneumothorax. No pulmonary edema. IMPRESSION: Significant decrease in left pleural fluid volume after thoracentesis. No pneumothorax. Underlying atelectasis/airspace disease of the left lower lung. Electronically Signed   By: Aletta Edouard M.D.   On: 02/25/2021 16:51   ECHOCARDIOGRAM COMPLETE  Result Date: 02/25/2021    ECHOCARDIOGRAM REPORT   Patient Name:   SKYY MCKNIGHT Date of Exam: 02/25/2021 Medical Rec #:  [id number]   Height:       73.0 in Accession #:     [id number]  Weight:       262.0 lb Date of Birth:  1946-02-28  BSA:          2.413 m Patient Age:    38 years    BP:           131/80 mmHg Patient Gender: M           HR:           67 bpm. Exam Location:  Inpatient Procedure: 2D Echo, Cardiac Doppler and Color Doppler Indications:    CHF  History:        Patient has prior history of Echocardiogram examinations, most                 recent 07/19/2019. CAD, Arrythmias:Atrial Fibrillation; Risk                 Factors:Hypertension, Former Smoker and Dyslipidemia.  Sonographer:    Cammy Brochure Referring Phys: [id number] Lequita Halt  Sonographer Comments: Technically difficult study due to poor echo windows. Image acquisition challenging due to patient body habitus. IMPRESSIONS  1. Left ventricular ejection fraction, by estimation, is 60 to 65%. The left ventricle has normal function. The left ventricle has no regional wall motion abnormalities. Left ventricular diastolic parameters were normal.  2. Right ventricular systolic function is normal. The right ventricular size is normal.  3. Large pleural effusion in the left lateral region.  4. The mitral valve is normal in structure. No evidence of mitral valve regurgitation. No evidence of mitral stenosis.  5. The aortic valve is normal in structure. Aortic valve regurgitation is not visualized. No aortic stenosis is present.  6. The inferior vena cava is dilated in size with >50% respiratory variability, suggesting right atrial pressure of 8 mmHg. Comparison(s): No significant cardiac change from prior study. Prior images reviewed side by side. There is a new large left pleural effusion with large nodular pleural based masses. FINDINGS  Left Ventricle: Left ventricular ejection fraction, by estimation, is 60 to 65%. The left ventricle has normal function. The left ventricle has no regional wall motion abnormalities. The left ventricular internal cavity size was normal in size. There is  no left ventricular  hypertrophy. Left ventricular diastolic parameters were normal. Right Ventricle: The right ventricular size is normal. No increase in right ventricular wall thickness. Right ventricular systolic function is normal. Left Atrium: Left atrial size was normal in size. Right Atrium: Right atrial size was normal in size. Pericardium: There is no evidence  of pericardial effusion. Mitral Valve: The mitral valve is normal in structure. No evidence of mitral valve regurgitation. No evidence of mitral valve stenosis. Tricuspid Valve: The tricuspid valve is normal in structure. Tricuspid valve regurgitation is not demonstrated. No evidence of tricuspid stenosis. Aortic Valve: The aortic valve is normal in structure. Aortic valve regurgitation is not visualized. No aortic stenosis is present. Aortic valve mean gradient measures 5.0 mmHg. Aortic valve peak gradient measures 9.2 mmHg. Aortic valve area, by VTI measures 3.69 cm. Pulmonic Valve: The pulmonic valve was normal in structure. Pulmonic valve regurgitation is not visualized. No evidence of pulmonic stenosis. Aorta: The aortic root is normal in size and structure. Venous: The inferior vena cava is dilated in size with greater than 50% respiratory variability, suggesting right atrial pressure of 8 mmHg. IAS/Shunts: No atrial level shunt detected by color flow Doppler. Additional Comments: There is a large pleural effusion in the left lateral region.  LEFT VENTRICLE PLAX 2D LVIDd:         5.70 cm  Diastology LVIDs:         3.80 cm  LV e' medial:    12.62 cm/s LV PW:         1.10 cm  LV E/e' medial:  8.6 LV IVS:        1.20 cm  LV e' lateral:   8.00 cm/s LVOT diam:     2.40 cm  LV E/e' lateral: 13.5 LV SV:         107 LV SV Index:   44 LVOT Area:     4.52 cm  RIGHT VENTRICLE            IVC RV S prime:     8.38 cm/s  IVC diam: 2.80 cm LEFT ATRIUM         Index LA diam:    3.00 cm 1.24 cm/m  AORTIC VALVE AV Area (Vmax):    4.20 cm AV Area (Vmean):   3.88 cm AV Area (VTI):      3.69 cm AV Vmax:           152.00 cm/s AV Vmean:          102.000 cm/s AV VTI:            0.289 m AV Peak Grad:      9.2 mmHg AV Mean Grad:      5.0 mmHg LVOT Vmax:         141.00 cm/s LVOT Vmean:        87.500 cm/s LVOT VTI:          0.236 m LVOT/AV VTI ratio: 0.82  AORTA Ao Root diam: 3.80 cm Ao Asc diam:  3.60 cm MITRAL VALVE MV Area (PHT): 3.31 cm     SHUNTS MV Decel Time: 229 msec     Systemic VTI:  0.24 m MV E velocity: 108.00 cm/s  Systemic Diam: 2.40 cm MV A velocity: 87.50 cm/s MV E/A ratio:  1.23 Mihai Croitoru MD Electronically signed by Sanda Klein MD Signature Date/Time: 02/25/2021/2:06:32 PM    Final    CUP PACEART REMOTE DEVICE CHECK  Result Date: 02/25/2021 ILR summary report received. Battery status OK. Normal device function. No new symptom, tachy, brady, or pause episodes. No new AF episodes. Monthly summary reports and ROV/PRN.  RP  CT CHEST ABDOMEN PELVIS WO CONTRAST  Result Date: 02/24/2021 CLINICAL DATA:  LEFT side chest and abdominal pain with nausea. History of coronary artery disease post coronary PTCA,  hypertension, GERD, atrial fibrillation, prostate cancer, former smoker EXAM: CT CHEST, ABDOMEN AND PELVIS WITHOUT CONTRAST TECHNIQUE: Multidetector CT imaging of the chest, abdomen and pelvis was performed following the standard protocol without IV contrast. Sagittal and coronal MPR images reconstructed from axial data set. COMPARISON:  CT abdomen and pelvis 02/18/2020, CT chest 12/10/2006 FINDINGS: CT CHEST FINDINGS Cardiovascular: Atherosclerotic calcifications aorta, coronary arteries and proximal great vessels. Aneurysmal dilatation ascending thoracic aorta 4.1 cm transverse image 28 previously 3.9 cm. Heart unremarkable. No pericardial effusion. Mediastinum/Nodes: Esophagus unremarkable. New enlarged azygoesophageal recess lymph nodes measuring 19 mm image 37 and 19 mm image 35. Normal sized paratracheal nodes. 11 mm AP window node image 25 new. Base of cervical region  unremarkable. Lungs/Pleura: Large LEFT pleural effusion. Suspected LEFT infrahilar mass extending into medial LEFT lower lobe with narrowing of LEFT lower lobe bronchi, ill-defined due to adjacent subtotal atelectasis in LEFT lower lobe with partial atelectasis of LEFT upper lobe. Minimal interstitial prominence at RIGHT lung base. No acute infiltrate or pneumothorax. Few small subpleural nodular foci in LEFT upper lobe, 5 mm image 69, 4 mm image 72, 5 mm image 79. Questionable 11 mm subpleural nodule in lingula image 89. Musculoskeletal: No acute osseous findings. CT ABDOMEN PELVIS FINDINGS Hepatobiliary: Gallbladder and liver normal appearance Pancreas: Normal appearance Spleen: Normal appearance Adrenals/Urinary Tract: Adrenal glands, kidneys, ureters, and bladder normal appearance Stomach/Bowel: Sigmoid diverticulosis without evidence of diverticulitis. Stomach decompressed. Normal appendix. Vascular/Lymphatic: Atherosclerotic calcifications of aorta and iliac arteries. Short segment of displaced atherosclerotic calcification at the mid to distal abdominal aorta, previously assessed by CT angio exam, felt to represent irregular plaque rather than aneurysm or penetrating ulcer, please refer to prior study. Aorta normal caliber. No adenopathy. Reproductive: Few metallic clips at the prostate gland, which is otherwise unremarkable. Seminal vesicles normal appearance. Other: No free air or free fluid. Small RIGHT inguinal hernia containing fat. Musculoskeletal: No acute osseous findings. IMPRESSION: New large LEFT pleural effusion with subtotal atelectasis of LEFT lower lobe and partial atelectasis of LEFT upper lobe. Few small subpleural nodular foci in LEFT lung, largest 11 mm, nonspecific, new since 2008. New azygoesophageal recess adenopathy. Suspicion of a LEFT infrahilar mass extending into LEFT lower lobe medially with narrowing of LEFT lower lobe bronchi, concerning for LEFT infrahilar neoplasm. Sigmoid  diverticulosis without evidence of diverticulitis. Small RIGHT inguinal hernia containing fat. No acute intra-abdominal or intrapelvic abnormalities. Aortic Atherosclerosis (ICD10-I70.0). Electronically Signed   By: Lavonia Dana M.D.   On: 02/24/2021 14:37    Microbiology: Recent Results (from the past 240 hour(s))  Resp Panel by RT-PCR (Flu A&B, Covid) Nasopharyngeal Swab     Status: None   Collection Time: 02/24/21  2:08 PM   Specimen: Nasopharyngeal Swab; Nasopharyngeal(NP) swabs in vial transport medium  Result Value Ref Range Status   SARS Coronavirus 2 by RT PCR NEGATIVE NEGATIVE Final    Comment: (NOTE) SARS-CoV-2 target nucleic acids are NOT DETECTED.  The SARS-CoV-2 RNA is generally detectable in upper respiratory specimens during the acute phase of infection. The lowest concentration of SARS-CoV-2 viral copies this assay can detect is 138 copies/mL. A negative result does not preclude SARS-Cov-2 infection and should not be used as the sole basis for treatment or other patient management decisions. A negative result may occur with  improper specimen collection/handling, submission of specimen other than nasopharyngeal swab, presence of viral mutation(s) within the areas targeted by this assay, and inadequate number of viral copies(<138 copies/mL). A negative result must be combined with  clinical observations, patient history, and epidemiological information. The expected result is Negative.  Fact Sheet for Patients:  EntrepreneurPulse.com.au  Fact Sheet for Healthcare Providers:  IncredibleEmployment.be  This test is no t yet approved or cleared by the [place] FDA and  has been authorized for detection and/or diagnosis of SARS-CoV-2 by FDA under an Emergency Use Authorization (EUA). This EUA will remain  in effect (meaning this test can be used) for the duration of the COVID-19 declaration under Section 564(b)(1) of the Act,  21 U.S.C.section 360bbb-3(b)(1), unless the authorization is terminated  or revoked sooner.       Influenza A by PCR NEGATIVE NEGATIVE Final   Influenza B by PCR NEGATIVE NEGATIVE Final    Comment: (NOTE) The Xpert Xpress SARS-CoV-2/FLU/RSV plus assay is intended as an aid in the diagnosis of influenza from Nasopharyngeal swab specimens and should not be used as a sole basis for treatment. Nasal washings and aspirates are unacceptable for Xpert Xpress SARS-CoV-2/FLU/RSV testing.  Fact Sheet for Patients: EntrepreneurPulse.com.au  Fact Sheet for Healthcare Providers: IncredibleEmployment.be  This test is not yet approved or cleared by the [place] FDA and has been authorized for detection and/or diagnosis of SARS-CoV-2 by FDA under an Emergency Use Authorization (EUA). This EUA will remain in effect (meaning this test can be used) for the duration of the COVID-19 declaration under Section 564(b)(1) of the Act, 21 U.S.C. section 360bbb-3(b)(1), unless the authorization is terminated or revoked.  Performed at [facility], [place] [address] [place], [place] [address]   Body fluid culture w Gram Stain     Status: None (Preliminary result)   Collection Time: 02/25/21  2:47 PM   Specimen: Pleura  Result Value Ref Range Status   Specimen Description PLEURAL FLUID  Final   Special Requests NONE  Final   Gram Stain   Final    FEW WBC PRESENT, PREDOMINANTLY MONONUCLEAR NO ORGANISMS SEEN    Culture   Final    NO GROWTH 2 DAYS Performed at [facility], [place] [address], [place], [place] [address]    Report Status PENDING  Incomplete  Surgical pcr screen     Status: None   Collection Time: 02/26/21 10:18 AM   Specimen: Nasal Mucosa; Nasal Swab  Result Value Ref Range Status   MRSA, PCR NEGATIVE NEGATIVE Final   Staphylococcus aureus NEGATIVE NEGATIVE Final    Comment: (NOTE) The Xpert SA Assay (FDA approved for NASAL specimens  in patients 68 years of age and older), is one component of a comprehensive surveillance program. It is not intended to diagnose infection nor to guide or monitor treatment. Performed at [facility], [place] [address] [place], [place] [address]      Labs: Basic Metabolic Panel: Recent Labs  Lab 02/24/21 1020 02/25/21 1518 02/26/21 0254  NA 136 133* 136  K 4.0 4.5 4.8  CL 104 103 104  CO2 _0 GLUCOSE 119* 115* 118*  BUN _1 CREATININE 1.03 0.98 1.02  CALCIUM 9.4 9.3 9.5  MG  --  1.8  --    Liver Function Tests: Recent Labs  Lab 02/25/21 1518  PROT 6.4*   No results for input(s): LIPASE, AMYLASE in the last 168 hours. No results for input(s): AMMONIA in the last 168 hours. CBC: Recent Labs  Lab 02/24/21 1020 02/25/21 0218 02/26/21 0254  WBC 5.5 5.9 6.1  HGB 12.1* 11.6* 11.9*  HCT 39.0 36.8* 37.2*  MCV  95.4 94.4 93.9  PLT 326 289 257   Cardiac Enzymes: No results for input(s): CKTOTAL, CKMB, CKMBINDEX, TROPONINI in the last 168 hours. BNP: BNP (last 3 results) No results for input(s): BNP in the last 8760 hours.  ProBNP (last 3 results) No results for input(s): PROBNP in the last 8760 hours.  CBG: No results for input(s): GLUCAP in the last 168 hours.     Signed:  Cristal Ford  [facility] 02/27/2021, 10:09 AM

## 2021-02-27 NOTE — Progress Notes (Signed)
SATURATION QUALIFICATIONS: (This note is used to comply with regulatory documentation for home oxygen)  Patient Saturations on Room Air at Rest = 97%  Patient Saturations on Room Air while Ambulating = 94%  Patient Saturations on  Liters of oxygen while Ambulating = n/a  Please briefly explain why patient needs home oxygen:

## 2021-02-27 NOTE — Progress Notes (Signed)
Discharge instructions provided to pt. He has no questions at this time. Awaiting for wife to pick pt up.

## 2021-02-28 LAB — BODY FLUID CULTURE W GRAM STAIN: Culture: NO GROWTH

## 2021-03-01 ENCOUNTER — Encounter (HOSPITAL_COMMUNITY): Payer: Self-pay | Admitting: Internal Medicine

## 2021-03-03 ENCOUNTER — Telehealth: Payer: Self-pay | Admitting: Internal Medicine

## 2021-03-03 NOTE — Telephone Encounter (Signed)
Called and informed patient of diagnosis. Has appts with radonc/medonc. Will ask PCCM office to schedule f/u appt w/ CXR

## 2021-03-05 ENCOUNTER — Encounter: Payer: Self-pay | Admitting: *Deleted

## 2021-03-05 ENCOUNTER — Ambulatory Visit: Payer: Medicare PPO | Admitting: Radiation Oncology

## 2021-03-05 ENCOUNTER — Encounter: Payer: Self-pay | Admitting: Emergency Medicine

## 2021-03-05 ENCOUNTER — Telehealth: Payer: Self-pay | Admitting: Pulmonary Disease

## 2021-03-05 ENCOUNTER — Other Ambulatory Visit: Payer: Self-pay

## 2021-03-05 ENCOUNTER — Inpatient Hospital Stay: Payer: Medicare PPO

## 2021-03-05 ENCOUNTER — Inpatient Hospital Stay: Payer: Medicare PPO | Attending: Internal Medicine | Admitting: Internal Medicine

## 2021-03-05 ENCOUNTER — Other Ambulatory Visit: Payer: Self-pay | Admitting: *Deleted

## 2021-03-05 ENCOUNTER — Ambulatory Visit
Admission: RE | Admit: 2021-03-05 | Discharge: 2021-03-05 | Disposition: A | Discharge status: Home / Self Care | Payer: Medicare PPO | Source: Ambulatory Visit | Attending: Radiation Oncology | Admitting: Radiation Oncology

## 2021-03-05 ENCOUNTER — Encounter: Payer: Self-pay | Admitting: Internal Medicine

## 2021-03-05 VITALS — BP 137/79 | HR 84 | Temp 97.3°F | Resp 20 | Ht 73.0 in | Wt 260.8 lb

## 2021-03-05 DIAGNOSIS — Z5111 Encounter for antineoplastic chemotherapy: Secondary | ICD-10-CM | POA: Insufficient documentation

## 2021-03-05 DIAGNOSIS — C3492 Malignant neoplasm of unspecified part of left bronchus or lung: Secondary | ICD-10-CM

## 2021-03-05 DIAGNOSIS — C61 Malignant neoplasm of prostate: Secondary | ICD-10-CM

## 2021-03-05 DIAGNOSIS — Z8042 Family history of malignant neoplasm of prostate: Secondary | ICD-10-CM

## 2021-03-05 DIAGNOSIS — C3490 Malignant neoplasm of unspecified part of unspecified bronchus or lung: Secondary | ICD-10-CM

## 2021-03-05 DIAGNOSIS — Z8546 Personal history of malignant neoplasm of prostate: Secondary | ICD-10-CM | POA: Diagnosis not present

## 2021-03-05 DIAGNOSIS — Z79899 Other long term (current) drug therapy: Secondary | ICD-10-CM | POA: Diagnosis not present

## 2021-03-05 DIAGNOSIS — J90 Pleural effusion, not elsewhere classified: Secondary | ICD-10-CM | POA: Diagnosis not present

## 2021-03-05 DIAGNOSIS — Z87891 Personal history of nicotine dependence: Secondary | ICD-10-CM | POA: Diagnosis not present

## 2021-03-05 DIAGNOSIS — R918 Other nonspecific abnormal finding of lung field: Secondary | ICD-10-CM

## 2021-03-05 DIAGNOSIS — C778 Secondary and unspecified malignant neoplasm of lymph nodes of multiple regions: Secondary | ICD-10-CM | POA: Diagnosis not present

## 2021-03-05 DIAGNOSIS — Z5112 Encounter for antineoplastic immunotherapy: Secondary | ICD-10-CM | POA: Insufficient documentation

## 2021-03-05 DIAGNOSIS — C3412 Malignant neoplasm of upper lobe, left bronchus or lung: Secondary | ICD-10-CM | POA: Diagnosis not present

## 2021-03-05 DIAGNOSIS — J910 Malignant pleural effusion: Secondary | ICD-10-CM | POA: Diagnosis not present

## 2021-03-05 DIAGNOSIS — C787 Secondary malignant neoplasm of liver and intrahepatic bile duct: Secondary | ICD-10-CM | POA: Insufficient documentation

## 2021-03-05 DIAGNOSIS — C3432 Malignant neoplasm of lower lobe, left bronchus or lung: Secondary | ICD-10-CM | POA: Diagnosis not present

## 2021-03-05 LAB — CMP (CANCER CENTER ONLY)
ALT: 15 U/L (ref 0–44)
AST: 19 U/L (ref 15–41)
Albumin: 3.5 g/dL (ref 3.5–5.0)
Alkaline Phosphatase: 52 U/L (ref 38–126)
Anion gap: 10 (ref 5–15)
BUN: 17 mg/dL (ref 8–23)
CO2: 24 mmol/L (ref 22–32)
Calcium: 9.6 mg/dL (ref 8.9–10.3)
Chloride: 104 mmol/L (ref 98–111)
Creatinine: 0.95 mg/dL (ref 0.61–1.24)
GFR, Estimated: >60 mL/min (ref >60)
Glucose, Bld: 109 mg/dL — ABNORMAL HIGH (ref 70–99)
Potassium: 4.4 mmol/L (ref 3.5–5.1)
Sodium: 138 mmol/L (ref 135–145)
Total Bilirubin: 0.4 mg/dL (ref 0.3–1.2)
Total Protein: 6.9 g/dL (ref 6.5–8.1)

## 2021-03-05 LAB — CBC WITH DIFFERENTIAL (CANCER CENTER ONLY)
Abs Immature Granulocytes: 0.08 K/uL — ABNORMAL HIGH (ref 0.00–0.07)
Basophils Absolute: 0.1 K/uL (ref 0.0–0.1)
Basophils Relative: 1 %
Eosinophils Absolute: 0.3 K/uL (ref 0.0–0.5)
Eosinophils Relative: 4 %
HCT: 36.3 % — ABNORMAL LOW (ref 39.0–52.0)
Hemoglobin: 11.5 g/dL — ABNORMAL LOW (ref 13.0–17.0)
Immature Granulocytes: 1 %
Lymphocytes Relative: 12 %
Lymphs Abs: 0.8 K/uL (ref 0.7–4.0)
MCH: 29.8 pg (ref 26.0–34.0)
MCHC: 31.7 g/dL (ref 30.0–36.0)
MCV: 94 fL (ref 80.0–100.0)
Monocytes Absolute: 0.7 K/uL (ref 0.1–1.0)
Monocytes Relative: 12 %
Neutro Abs: 4.4 K/uL (ref 1.7–7.7)
Neutrophils Relative %: 70 %
Platelet Count: 331 K/uL (ref 150–400)
RBC: 3.86 MIL/uL — ABNORMAL LOW (ref 4.22–5.81)
RDW: 15.9 % — ABNORMAL HIGH (ref 11.5–15.5)
WBC Count: 6.3 K/uL (ref 4.0–10.5)
nRBC: 0 % (ref 0.0–0.2)

## 2021-03-05 NOTE — Telephone Encounter (Signed)
I have called and spoke with pt and he is aware to come in on Monday to the office and see [place] stated that he will drain him in the office around 1215.  Pt voiced his understanding and he will go to the ER if he feels that he gets increased [facility] or other issues with breathing.  Nothing further is needed.

## 2021-03-05 NOTE — Progress Notes (Signed)
The proposed treatment discussed in cancer conference is for discussion purpose only and is not a binding recommendation. The patient was not physically examined nor present for their treatment options. Therefore, final treatment plans cannot be decided.  ?

## 2021-03-05 NOTE — Progress Notes (Signed)
Per Dr. [name], [employer] ordered and paper work completed.

## 2021-03-05 NOTE — Progress Notes (Signed)
Per Dr. [name], ambulatory referral to genetics.

## 2021-03-05 NOTE — Progress Notes (Signed)
Radiation Oncology         [phone] ________________________________ Multidisciplinary Thoracic Oncology Clinic 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    ICD-10-CM   1. Non-small cell carcinoma of left lung, stage 4 (HCC)  C34.92     HISTORY OF PRESENT ILLNESS: Terri Malerba is a 75 y.o. male seen at the request of Dr. Ina Homes.  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 He recentlly presented to the emergency department on 02/24/2021 with complaints of progressive generalized weakness, shortness of breath and nonproductive cough for the past 1 to 2 months.  He initially thought it was attributed to the Eligard ADT he had gotten in December 2021. He has a remote history of smoking. On admission, he had a CXR that showed a moderate-large left pleural effusion. This was further evaluated with CT Chest and this showed a large left infrahilar mass extending into the medial LLL with narrowing of the left lower lobe bronchi and a large left pleural effusion. Additionally, there were small scattered subpleural nodules and lymphadenopathy in the azygoesophageal recess. He had a thoracentisis procedure on 02/25/21 for both therapeutic and diagnostic purposes. 1.5 L of fluid was removed and the cytology showed malignancy. A bronchoscopy/EBUS procedure was performed by Dr. Ina Homes on 02/26/21 for biopsy. At the time of the procedure, he was noted to have complete obstruction of the LLL with  tumor and some endobronchial invasion and on FNA from a station 7 node, he was confirmed to have a non-small cell carcinoma.      The patient was referred today for presentation in the multidisciplinary thoracic oncology conference. Radiology studies and pathology slides were presented there for review and discussion of treatment options. A consensus was discussed regarding potential next steps.  PREVIOUS RADIATION THERAPY: Yes  05/27/20 - 07/25/20; concurrent with ADT (started 03/11/20) 1. The prostate, seminal vesicles, and pelvic lymph nodes were initially treated to 45 Gy in 25 fractions of 1.8 Gy  2. The prostate only was boosted to 75 Gy with 15 additional fractions of 2.0 Gy   PAST MEDICAL HISTORY:  Past Medical History:  Diagnosis Date  . Anemia   . Anxiety   . Arthritis    "back, right knee, hands, ankles, neck" (05/31/2016)  . Carotid artery disease ([place]) 08/30/2016   Carotid US 11/19: R 1-39, L 40-59 // Carotid US 08/2019: R 1-39; L 40-59 // Carotid US 11/21: R 1-39; L 40-59>> repeat 1 year   . Chronic lower back pain   . Coronary atherosclerosis of native coronary artery    a. BMS to [facility] 2004 and 2007, otherwise mild nonobstructive disease. EF normal.  . Diverticulitis   . Dyslipidemia   . Dyspnea   . Essential hypertension, benign   . GERD (gastroesophageal reflux disease)   . Lumbar radiculopathy, chronic 02/04/2015   Right L5  . Obesity   . OSA on CPAP    uses cpap  . Paroxysmal atrial fibrillation ([place])    a. Discovered after stroke.  Marland Kitchen  Pneumonia 01/1996  . PONV (postoperative nausea and vomiting)   . Prostate CA ([place])   . Recurrent upper respiratory infection (URI)   . Stroke ([place]) 07/2012   no deficits  . Visit for monitoring Tikosyn therapy 09/18/2019      PAST SURGICAL HISTORY: Past Surgical History:  Procedure Laterality Date  . ADENOIDECTOMY    . ANTERIOR CERVICAL DECOMP/DISCECTOMY FUSION  07/2001; 10/2002   "C5-6; C6-7; redo"  . BACK SURGERY    .  CARPAL TUNNEL RELEASE Left 10/2015  . COLONOSCOPY W/ POLYPECTOMY  02/2014  . CORONARY ANGIOPLASTY WITH STENT PLACEMENT  05/2003; 12/2005   "mid RCA; mid RCA"  . FINE NEEDLE ASPIRATION  02/26/2021   Procedure: FINE NEEDLE ASPIRATION (FNA) LINEAR;  Surgeon: Candee Furbish, MD;  Location: [facility] ENDOSCOPY;  Service: Pulmonary;;  . HAND SURGERY  02/2019   LEFT HAND  . implantable loop recorder placement  02/27/2020   Medtronic Reveal [place] model [id number]  implantable loop recorder implanted by Dr Rayann Heman for afib management and evaluation of presyncope  . JOINT REPLACEMENT    . KNEE ARTHROSCOPY Left 10/2005  . KNEE ARTHROSCOPY W/ PARTIAL MEDIAL MENISCECTOMY Left 09/2005  . LUMBAR LAMINECTOMY/DECOMPRESSION MICRODISCECTOMY  03/2005   "L4-5"  . POSTERIOR LUMBAR FUSION  10/2003   L5-S1; "plates, screws"  . SHOULDER ARTHROSCOPY Right 08/2011   Debridement of labrum, arthroscopic distal clavicle excision  . SHOULDER OPEN ROTATOR CUFF REPAIR Left 07/2014  . TEE WITHOUT CARDIOVERSION  07/07/2012   Procedure: TRANSESOPHAGEAL ECHOCARDIOGRAM (TEE);  Surgeon: Fay Records, MD;  Location: [facility] ENDOSCOPY;  Service: Cardiovascular;  Laterality: N/A;  . THORACENTESIS N/A 02/25/2021   Procedure: Mathews Robinsons;  Surgeon: Juanito Doom, MD;  Location: [place];  Service: Cardiopulmonary;  Laterality: N/A;  . TONSILLECTOMY AND ADENOIDECTOMY  ~ 1956  . TOTAL KNEE ARTHROPLASTY Left 10/2006  . TRIGGER FINGER RELEASE Left 10/2015  . VIDEO BRONCHOSCOPY WITH ENDOBRONCHIAL ULTRASOUND Left 02/26/2021   Procedure: VIDEO BRONCHOSCOPY WITH ENDOBRONCHIAL ULTRASOUND;  Surgeon: Candee Furbish, MD;  Location: [facility] ENDOSCOPY;  Service: Pulmonary;  Laterality: Left;  cryoprobe too thanks!    FAMILY HISTORY:  Family History  Problem Relation Age of Onset  . Hypertension Mother   . Heart disease Father   . Heart attack Father   . Prostate cancer Brother   . Parkinson's disease Brother   . Blindness Son   . Breast cancer  Neg Hx   . Colon cancer Neg Hx   . Pancreatic cancer Neg Hx     SOCIAL HISTORY:  Social History   Socioeconomic History  . Marital status: Married    Spouse name: [name]  . Number of children: 3  . Years of education: 39  . Highest education level: Not on file  Occupational History  . Occupation: Retired    Fish farm manager: DISABLED     Comment: Works at [employer] part time  Tobacco Use  . Smoking status: Former Smoker    Packs/day: 1.00    Years: 34.00    Pack years: 34.00    Types: Cigarettes    Quit date: 05/05/2003    Years since quitting: 17.8  . Smokeless tobacco: Never Used  Vaping Use  . Vaping Use: Never used  Substance and Sexual Activity  . Alcohol use: No    Alcohol/week: 0.0 standard drinks  . Drug use: No  . Sexual activity: Not Currently  Other Topics Concern  . Not on file  Social History Narrative   Patient  lives at home with spouse.   Caffeine Use:    Social Determinants of Radio broadcast assistant Strain: Not on file  Food Insecurity: Not on file  Transportation Needs: Not on file  Physical Activity: Not on file  Stress: Not on file  Social Connections: Not on file  Intimate Partner Violence: Not on file    ALLERGIES: Amoxicillin, Fosinopril, Hydromorphone, Ampicillin, Codeine, Monopril [fosinopril sodium], Rosuvastatin, and Percocet [oxycodone-acetaminophen]  MEDICATIONS:  Current Outpatient Medications  Medication Sig Dispense Refill  . acetaminophen (TYLENOL 8 HOUR ARTHRITIS PAIN) 650 MG CR tablet Take 1,300 mg by mouth as needed for pain (pain).     Marland Kitchen albuterol (VENTOLIN HFA) 108 (90 Base) MCG/ACT inhaler Inhale 2 puffs into the lungs every 4 hours as needed (Patient taking differently: Inhale 2 puffs into the lungs every 4 (four) hours as needed (allergies).) 6.7 each 0  . atorvastatin (LIPITOR) 40 MG tablet Take 1 tablet (40 mg total) by mouth daily. 90 tablet 3  . azelastine (ASTELIN) 0.1 % nasal spray Place 2 sprays into both nostrils 2  (two) times daily. 30 mL 5  . clindamycin (CLEOCIN) 300 MG capsule Take 300 mg by mouth See admin instructions. Uses for dental procedures    . diclofenac Sodium (VOLTAREN) 1 % GEL Apply 4 g topically 4 (four) times daily. 100 g 0  . dicyclomine (BENTYL) 20 MG tablet Take 20 mg by mouth as needed for spasms.    Marland Kitchen diltiazem (CARDIZEM) 30 MG tablet Take 1 tablet (30 mg total) by mouth 4 (four) times daily as needed. (Patient taking differently: Take 30 mg by mouth 4 (four) times daily as needed (heart.).) 60 tablet 6  . dofetilide (TIKOSYN) 500 MCG capsule Take 1 capsule (500 mcg total) by mouth 2 (two) times daily. Please keep upcoming appointment in February for future refills. Thank you 180 capsule 3  . ergocalciferol (VITAMIN D2) 1.25 MG (50000 UT) capsule Take 1 capsule (50,000 Units total) by mouth once a week. (Patient taking differently: Take 50,000 Units by mouth once a week. mondays) 12 capsule 1  . feeding supplement (ENSURE ENLIVE / ENSURE PLUS) LIQD Take 237 mLs by mouth 2 (two) times daily between meals. 60 mL 0  . fenofibrate 160 MG tablet TAKE 1 TABLET DAILY. PLEASE KEEP UPCOMING APPT IN MARCH WITH DR. Acie Fredrickson BEFORE ANYMORE REFILLS. (Patient taking differently: Take 160 mg by mouth daily.) 90 tablet 3  . fluticasone (FLONASE) 50 MCG/ACT nasal spray Place 2 sprays into both nostrils daily. 18.2 mL 5  . fluticasone (FLOVENT HFA) 110 MCG/ACT inhaler Inhale 2 puffs into the lungs in the morning and at bedtime. with spacer and rinse mouth afterwards. 1 each 5  . leuprolide, 6 Month, (ELIGARD) 45 MG injection Inject 45 mg into the skin every 6 (six) months.    . levocetirizine (XYZAL) 5 MG tablet Take 5 mg by mouth every evening.    . loperamide (IMODIUM) 2 MG capsule Take 2 mg by mouth as needed for diarrhea or loose stools.    . magnesium oxide (MAG-OX) 400 MG tablet TAKE 1 TABLET BY MOUTH EVERY DAY (Patient taking differently: Take 400 mg by mouth daily.) 90 tablet 2  . metoprolol succinate  (TOPROL XL) 25 MG 24 hr tablet Take 1 tablet (25 mg total) by mouth daily. 90 tablet 3  . Multiple Vitamins-Minerals (MULTIVITAMINS THER. W/MINERALS) TABS Take 1 tablet by mouth daily.    . nitroGLYCERIN (NITROSTAT) 0.4 MG SL tablet Place 1 tablet (  0.4 mg total) under the tongue every 5 (five) minutes as needed for chest pain. 25 tablet 5  . omeprazole (PRILOSEC) 10 MG capsule TAKE 1 CAPSULE DAILY. (Patient taking differently: Take 10 mg by mouth daily.) 90 capsule 3  . oxybutynin (DITROPAN) 5 MG tablet Take 1 tablet by mouth 2 (two) times daily as needed for bladder spasms.    . potassium chloride (KLOR-CON) 10 MEQ tablet Take 1 tablet (10 mEq total) by mouth daily. 90 tablet 2  . Saccharomyces boulardii (PROBIOTIC) 250 MG CAPS Take 1 capsule by mouth daily.    . tamsulosin (FLOMAX) 0.4 MG CAPS capsule Take 0.4 mg by mouth daily.    Marland Kitchen topiramate (TOPAMAX) 50 MG tablet Take 2 tablets (100 mg total) by mouth 2 (two) times daily. 360 tablet 3  . traMADol (ULTRAM) 50 MG tablet Take 1 tablet (50 mg total) by mouth every 6 (six) hours as needed for moderate pain. 30 tablet 0  . traZODone (DESYREL) 50 MG tablet Take 0.5 tablets (25 mg total) by mouth at bedtime as needed for sleep. 45 tablet 3  . XARELTO 20 MG TABS tablet TAKE 1 TABLET BY MOUTH EVERY DAY (Patient taking differently: Take 20 mg by mouth daily with supper.) 90 tablet 2   No current facility-administered medications for this visit.    REVIEW OF SYSTEMS:  On review of systems, the patient reports that he is doing fair overall. He denies any chest pain, cough, hemoptysis, fevers, chills, night sweats, or unintended weight changes. He has continued with progressive shortness of breath, with re-accumulation of the pleural fluid which also leads to generalized weakness.  He has also noted some dizziness with position changes and has fallen at home recently. He denies any bowel or bladder disturbances, and denies abdominal pain, nausea or vomiting.  He denies any new musculoskeletal or joint aches or pains. A complete review of systems is obtained and is otherwise negative.  PHYSICAL EXAM:  Wt Readings from Last 3 Encounters:  02/26/21 262 lb (118.8 kg)  01/21/21 256 lb (116.1 kg)  11/20/20 266 lb (120.7 kg)   Temp Readings from Last 3 Encounters:  02/27/21 97.7 F (36.5 C) (Oral)  11/20/20 (!) 96.5 F (35.8 C) (Temporal)  10/08/20 97.8 F (36.6 C) (Tympanic)   BP Readings from Last 3 Encounters:  02/27/21 125/82  01/21/21 116/72  11/20/20 118/64   Pulse Readings from Last 3 Encounters:  02/27/21 68  01/21/21 91  11/20/20 75    /10  In general this is a well appearing Caucasian male in no acute distress. He is alert and oriented x4 and appropriate throughout the examination. HEENT reveals that the patient is normocephalic, atraumatic. EOMs are intact. PERRLA. Skin is intact without any evidence of gross lesions.. Cardiopulmonary assessment is negative for acute distress and he exhibits normal effort. The abdomen is soft, non tender, non distended. Lower extremities are negative for pretibial pitting edema, deep calf tenderness, cyanosis or clubbing.  KPS = 60  100 - Normal; no complaints; no evidence of disease. 90   - Able to carry on normal activity; minor signs or symptoms of disease. 80   - Normal activity with effort; some signs or symptoms of disease. 44   - Cares for self; unable to carry on normal activity or to do active work. 60   - Requires occasional assistance, but is able to care for most of his personal needs. 50   - Requires considerable assistance and frequent medical care.  54   - Disabled; requires special care and assistance. 28   - Severely disabled; hospital admission is indicated although death not imminent. 29   - Very sick; hospital admission necessary; active supportive treatment necessary. 10   - Moribund; fatal processes progressing rapidly. 0     - Dead  Karnofsky DA, Abelmann [place], Craver LS  and Burchenal JH [phone]) The use of the nitrogen mustards in the palliative treatment of carcinoma: with particular reference to bronchogenic carcinoma Cancer 1 634-56  LABORATORY DATA:  Lab Results  Component Value Date   WBC 6.1 02/26/2021   HGB 11.9 (L) 02/26/2021   HCT 37.2 (L) 02/26/2021   MCV 93.9 02/26/2021   PLT 257 02/26/2021   Lab Results  Component Value Date   NA 136 02/26/2021   K 4.8 02/26/2021   CL 104 02/26/2021   CO2 25 02/26/2021   Lab Results  Component Value Date   ALT 19 01/21/2021   AST 23 10/08/2020   ALKPHOS 50 10/08/2020   BILITOT 0.6 10/08/2020     RADIOGRAPHY: DG Chest 2 View  Result Date: 02/24/2021 CLINICAL DATA:  Chest pain, shortness of breath. EXAM: CHEST - 2 VIEW COMPARISON:  September 04, 2020. FINDINGS: Stable cardiomegaly. No pneumothorax is noted. Right lung is clear. Interval development of moderate to large left pleural effusion is noted with associated left basilar atelectasis. Bony thorax is unremarkable. IMPRESSION: Interval development of moderate to large left pleural effusion with associated left basilar atelectasis. Electronically Signed   By: Marijo Conception M.D.   On: 02/24/2021 11:04   DG Chest Port 1 View  Result Date: 02/25/2021 CLINICAL DATA:  Left pleural effusion and status post bedside thoracentesis. EXAM: PORTABLE CHEST 1 VIEW COMPARISON:  02/24/2021 FINDINGS: Stable heart size and appearance of implanted loop recorder. Significant decrease in volume of left pleural fluid after thoracentesis with probable small volume of fluid remaining. Underlying atelectasis/airspace disease of the left lower lung. No pneumothorax. No pulmonary edema. IMPRESSION: Significant decrease in left pleural fluid volume after thoracentesis. No pneumothorax. Underlying atelectasis/airspace disease of the left lower lung. Electronically Signed   By: Aletta Edouard M.D.   On: 02/25/2021 16:51   ECHOCARDIOGRAM COMPLETE  Result Date: 02/25/2021     ECHOCARDIOGRAM REPORT   Patient Name:   MADDEN GARRON Date of Exam: 02/25/2021 Medical Rec #:  [id number]   Height:       73.0 in Accession #:    [id number]  Weight:       262.0 lb Date of Birth:  03/19/1946  BSA:          2.413 m Patient Age:    75 years    BP:           131/80 mmHg Patient Gender: M           HR:           67 bpm. Exam Location:  Inpatient Procedure: 2D Echo, Cardiac Doppler and Color Doppler Indications:    CHF  History:        Patient has prior history of Echocardiogram examinations, most                 recent 07/19/2019. CAD, Arrythmias:Atrial Fibrillation; Risk                 Factors:Hypertension, Former Smoker and Dyslipidemia.  Sonographer:    Cammy Brochure Referring Phys: [id number] Lequita Halt  Sonographer Comments: Technically difficult study due to  poor echo windows. Image acquisition challenging due to patient body habitus. IMPRESSIONS  1. Left ventricular ejection fraction, by estimation, is 60 to 65%. The left ventricle has normal function. The left ventricle has no regional wall motion abnormalities. Left ventricular diastolic parameters were normal.  2. Right ventricular systolic function is normal. The right ventricular size is normal.  3. Large pleural effusion in the left lateral region.  4. The mitral valve is normal in structure. No evidence of mitral valve regurgitation. No evidence of mitral stenosis.  5. The aortic valve is normal in structure. Aortic valve regurgitation is not visualized. No aortic stenosis is present.  6. The inferior vena cava is dilated in size with >50% respiratory variability, suggesting right atrial pressure of 8 mmHg. Comparison(s): No significant cardiac change from prior study. Prior images reviewed side by side. There is a new large left pleural effusion with large nodular pleural based masses. FINDINGS  Left Ventricle: Left ventricular ejection fraction, by estimation, is 60 to 65%. The left ventricle has normal function. The left ventricle has  no regional wall motion abnormalities. The left ventricular internal cavity size was normal in size. There is  no left ventricular hypertrophy. Left ventricular diastolic parameters were normal. Right Ventricle: The right ventricular size is normal. No increase in right ventricular wall thickness. Right ventricular systolic function is normal. Left Atrium: Left atrial size was normal in size. Right Atrium: Right atrial size was normal in size. Pericardium: There is no evidence of pericardial effusion. Mitral Valve: The mitral valve is normal in structure. No evidence of mitral valve regurgitation. No evidence of mitral valve stenosis. Tricuspid Valve: The tricuspid valve is normal in structure. Tricuspid valve regurgitation is not demonstrated. No evidence of tricuspid stenosis. Aortic Valve: The aortic valve is normal in structure. Aortic valve regurgitation is not visualized. No aortic stenosis is present. Aortic valve mean gradient measures 5.0 mmHg. Aortic valve peak gradient measures 9.2 mmHg. Aortic valve area, by VTI measures 3.69 cm. Pulmonic Valve: The pulmonic valve was normal in structure. Pulmonic valve regurgitation is not visualized. No evidence of pulmonic stenosis. Aorta: The aortic root is normal in size and structure. Venous: The inferior vena cava is dilated in size with greater than 50% respiratory variability, suggesting right atrial pressure of 8 mmHg. IAS/Shunts: No atrial level shunt detected by color flow Doppler. Additional Comments: There is a large pleural effusion in the left lateral region.  LEFT VENTRICLE PLAX 2D LVIDd:         5.70 cm  Diastology LVIDs:         3.80 cm  LV e' medial:    12.62 cm/s LV PW:         1.10 cm  LV E/e' medial:  8.6 LV IVS:        1.20 cm  LV e' lateral:   8.00 cm/s LVOT diam:     2.40 cm  LV E/e' lateral: 13.5 LV SV:         107 LV SV Index:   44 LVOT Area:     4.52 cm  RIGHT VENTRICLE            IVC RV S prime:     8.38 cm/s  IVC diam: 2.80 cm LEFT ATRIUM          Index LA diam:    3.00 cm 1.24 cm/m  AORTIC VALVE AV Area (Vmax):    4.20 cm AV Area (Vmean):   3.88 cm AV Area (VTI):  3.69 cm AV Vmax:           152.00 cm/s AV Vmean:          102.000 cm/s AV VTI:            0.289 m AV Peak Grad:      9.2 mmHg AV Mean Grad:      5.0 mmHg LVOT Vmax:         141.00 cm/s LVOT Vmean:        87.500 cm/s LVOT VTI:          0.236 m LVOT/AV VTI ratio: 0.82  AORTA Ao Root diam: 3.80 cm Ao Asc diam:  3.60 cm MITRAL VALVE MV Area (PHT): 3.31 cm     SHUNTS MV Decel Time: 229 msec     Systemic VTI:  0.24 m MV E velocity: 108.00 cm/s  Systemic Diam: 2.40 cm MV A velocity: 87.50 cm/s MV E/A ratio:  1.23 Mihai Croitoru MD Electronically signed by Sanda Klein MD Signature Date/Time: 02/25/2021/2:06:32 PM    Final    CUP PACEART REMOTE DEVICE CHECK  Result Date: 02/25/2021 ILR summary report received. Battery status OK. Normal device function. No new symptom, tachy, brady, or pause episodes. No new AF episodes. Monthly summary reports and ROV/PRN.  RP  CT CHEST ABDOMEN PELVIS WO CONTRAST  Result Date: 02/24/2021 CLINICAL DATA:  LEFT side chest and abdominal pain with nausea. History of coronary artery disease post coronary PTCA, hypertension, GERD, atrial fibrillation, prostate cancer, former smoker EXAM: CT CHEST, ABDOMEN AND PELVIS WITHOUT CONTRAST TECHNIQUE: Multidetector CT imaging of the chest, abdomen and pelvis was performed following the standard protocol without IV contrast. Sagittal and coronal MPR images reconstructed from axial data set. COMPARISON:  CT abdomen and pelvis 02/18/2020, CT chest 12/10/2006 FINDINGS: CT CHEST FINDINGS Cardiovascular: Atherosclerotic calcifications aorta, coronary arteries and proximal great vessels. Aneurysmal dilatation ascending thoracic aorta 4.1 cm transverse image 28 previously 3.9 cm. Heart unremarkable. No pericardial effusion. Mediastinum/Nodes: Esophagus unremarkable. New enlarged azygoesophageal recess lymph nodes  measuring 19 mm image 37 and 19 mm image 35. Normal sized paratracheal nodes. 11 mm AP window node image 25 new. Base of cervical region unremarkable. Lungs/Pleura: Large LEFT pleural effusion. Suspected LEFT infrahilar mass extending into medial LEFT lower lobe with narrowing of LEFT lower lobe bronchi, ill-defined due to adjacent subtotal atelectasis in LEFT lower lobe with partial atelectasis of LEFT upper lobe. Minimal interstitial prominence at RIGHT lung base. No acute infiltrate or pneumothorax. Few small subpleural nodular foci in LEFT upper lobe, 5 mm image 69, 4 mm image 72, 5 mm image 79. Questionable 11 mm subpleural nodule in lingula image 89. Musculoskeletal: No acute osseous findings. CT ABDOMEN PELVIS FINDINGS Hepatobiliary: Gallbladder and liver normal appearance Pancreas: Normal appearance Spleen: Normal appearance Adrenals/Urinary Tract: Adrenal glands, kidneys, ureters, and bladder normal appearance Stomach/Bowel: Sigmoid diverticulosis without evidence of diverticulitis. Stomach decompressed. Normal appendix. Vascular/Lymphatic: Atherosclerotic calcifications of aorta and iliac arteries. Short segment of displaced atherosclerotic calcification at the mid to distal abdominal aorta, previously assessed by CT angio exam, felt to represent irregular plaque rather than aneurysm or penetrating ulcer, please refer to prior study. Aorta normal caliber. No adenopathy. Reproductive: Few metallic clips at the prostate gland, which is otherwise unremarkable. Seminal vesicles normal appearance. Other: No free air or free fluid. Small RIGHT inguinal hernia containing fat. Musculoskeletal: No acute osseous findings. IMPRESSION: New large LEFT pleural effusion with subtotal atelectasis of LEFT lower lobe and partial atelectasis of LEFT upper lobe.  Few small subpleural nodular foci in LEFT lung, largest 11 mm, nonspecific, new since 2008. New azygoesophageal recess adenopathy. Suspicion of a LEFT infrahilar mass  extending into LEFT lower lobe medially with narrowing of LEFT lower lobe bronchi, concerning for LEFT infrahilar neoplasm. Sigmoid diverticulosis without evidence of diverticulitis. Small RIGHT inguinal hernia containing fat. No acute intra-abdominal or intrapelvic abnormalities. Aortic Atherosclerosis (ICD10-I70.0). Electronically Signed   By: Lavonia Dana M.D.   On: 02/24/2021 14:37      IMPRESSION/PLAN: 1. 75 y.o. 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  Today, we talked to the patient and his wife about the findings and workup thus far. We recommend that he proceed with the recommended MRI brain and PET scan to complete his disease staging but feel that, in the setting of an obstructing lung mass, we should move forward with more urgent palliative radiotherapy to help open the airways and prevent further compromise. 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 We discussed the available radiation techniques, and focused on the details and logistics of delivery. We reviewed the anticipated acute and late sequelae associated with radiation in this setting. The patient and his wife were encouraged to ask questions that were answered to their satisfaction.  He has provided verbal consent to proceed today in the office so we have tentatively scheduled him for CT SIM/treatment planning at 2:30pm on Friday 03/06/21, in anticipation of beginning his 2 week course of daily palliative radiotherapy on Tuesday, 03/10/21. We will share our discussion and plan with his team to keep everyone in the loop.   We personally spent 60 minutes in this encounter including chart review, reviewing radiological studies, meeting face-to-face with the patient, entering orders and completing documentation.    Nicholos Johns, PA-C    Tyler Pita, MD  [facility] Direct Dial: [phone]  Fax: [phone] [email or web address]  Skype  [email or web address]

## 2021-03-05 NOTE — Research (Signed)
[id number] - Customer service manager for the Discovery and Validation of Biomarkers for the Prediction, Diagnosis, and Management of Disease  03/05/21  Patient Corey [name] was identified by this research coordinator as a potential candidate for the above listed study.  This Clinical Research Coordinator met with Tsutomu Barfoot, [id number], on 03/05/21 in a manner and location that ensures patient privacy to discuss participation in the above listed research study.  Patient is Accompanied by [name].  A copy of the informed consent document and separate HIPAA Authorization was provided to the patient.  Patient reads, speaks, and understands [place].    Patient was provided with the business card of this Coordinator and encouraged to contact the research team with any questions.  Approximately 10 minutes were spent with the patient reviewing the informed consent documents.  Patient was provided the option of taking informed consent documents home to review and was encouraged to review at their convenience with their support network, including other care providers. Patient took the consent documents home to review.  Will call the patient next week to answer any questions and go over next steps.  Clabe Seal Clinical Research Coordinator I  03/05/21 3:29 PM

## 2021-03-05 NOTE — Progress Notes (Signed)
Per Dr. [name], I notified path dept to send recent bx on Corey [name]'s pathology for molecular testing and PDL 1 and to send to [facility].

## 2021-03-05 NOTE — Progress Notes (Signed)
[place] [phone]   [phone] Multidisciplinary thoracic oncology clinic CONSULT NOTE  REFERRING PHYSICIAN: Dr. Ina Homes  REASON FOR CONSULTATION:  75 years old white male recently diagnosed with lung cancer.  HPI Semir Brill is a 75 y.o. 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 fib, hypertension, essential tremor and long history of smoking.  The patient presented to the hospital on Feb 24, 2021 complaining of shortness of breath that has been getting worse over the previous 2-3 months.  He also had left-sided chest discomfort and productive cough with no hemoptysis.  Chest x-ray on 02/24/2021 showed interval development of moderate to large pleural effusion with associated left basilar atelectasis.  This was followed by CT scan of the chest, abdomen pelvis on the same day and it showed large left pleural effusion with suspected left infrahilar mass extending into the medial left lower lobe with narrowing of the left lower lobe bronchi, ill-defined due to adjacent subtotal atelectasis in the left lower lobe with partial atelectasis of the left upper lobe and minimal interstitial prominence at the right lung base.  There was also new enlarged azygos esophageal recess lymph node measuring 1.9 cm.  There was 1.1 cm AP window lymph node that is new.  The patient underwent left-sided thoracentesis as well as bronchoscopy under the care of Dr. Maylon Peppers on 02/26/2021 and it showed malignant cells present in the left pleural effusion.  The fine-needle aspiration of the level 7 lymph node ([id number]) showed malignant cells consistent with non-small cell carcinoma favoring adenocarcinoma based on the discussion at the weekly thoracic conference earlier today with Dr. Tresa Moore.  The patient was referred to the multidisciplinary thoracic oncology clinic today  for evaluation and recommendation regarding his condition. When seen today the patient is feeling fine except for feeling foggy in his head and also fatigue and weakness.  He has a fall yesterday and hit the right side of his body with some pain.  He also has blurring vision in the right eye and was scheduled to see his ophthalmologist next week.  He denied having any significant chest pain but has shortness of breath increased with exertion with mild cough and no hemoptysis.  He denied having any significant weight loss or night sweats.  He has occasional nausea with no vomiting, abdominal pain or constipation but few episodes of diarrhea. Family history significant for mother with a stroke and hypertension.  Father has heart disease at age 74 and brother had prostate cancer. The patient is married and has 3 children 2 from previous marriage and 1 daughter with his current wife.  He used to work as a Dance movement psychotherapist at [employer]  He was accompanied today by his wife [name].  The patient has a history of smoking 1 pack/day for around 20 years and quit May 05, 2003.  He drinks alcohol occasionally and no history of drug abuse.  HPI  Past Medical History:  Diagnosis Date  . Anemia   . Anxiety   . Arthritis    "back, right knee, hands, ankles, neck" (05/31/2016)  . Carotid artery disease ([place]) 08/30/2016   Carotid US 11/19: R 1-39, L 40-59 // Carotid US 08/2019: R 1-39; L 40-59 // Carotid US 11/21: R 1-39; L 40-59>> repeat 1 year   . Chronic lower back pain   . Coronary atherosclerosis of native coronary artery  a. BMS to [facility] 2004 and 2007, otherwise mild nonobstructive disease. EF normal.  . Diverticulitis   . Dyslipidemia   . Dyspnea   . Essential hypertension, benign   . GERD (gastroesophageal reflux disease)   . Lumbar radiculopathy, chronic 02/04/2015   Right L5  . Obesity   . OSA on CPAP    uses cpap  . Paroxysmal atrial fibrillation ([place])    a. Discovered after stroke.  . Pneumonia  01/1996  . PONV (postoperative nausea and vomiting)   . Prostate CA ([place])   . Recurrent upper respiratory infection (URI)   . Stroke ([place]) 07/2012   no deficits  . Visit for monitoring Tikosyn therapy 09/18/2019    Past Surgical History:  Procedure Laterality Date  . ADENOIDECTOMY    . ANTERIOR CERVICAL DECOMP/DISCECTOMY FUSION  07/2001; 10/2002   "C5-6; C6-7; redo"  . BACK SURGERY    . CARPAL TUNNEL RELEASE Left 10/2015  . COLONOSCOPY W/ POLYPECTOMY  02/2014  . CORONARY ANGIOPLASTY WITH STENT PLACEMENT  05/2003; 12/2005   "mid RCA; mid RCA"  . FINE NEEDLE ASPIRATION  02/26/2021   Procedure: FINE NEEDLE ASPIRATION (FNA) LINEAR;  Surgeon: Candee Furbish, MD;  Location: [facility] ENDOSCOPY;  Service: Pulmonary;;  . HAND SURGERY  02/2019   LEFT HAND  . implantable loop recorder placement  02/27/2020   Medtronic Reveal [place] model [id number]  implantable loop recorder implanted by Dr Rayann Heman for afib management and evaluation of presyncope  . JOINT REPLACEMENT    . KNEE ARTHROSCOPY Left 10/2005  . KNEE ARTHROSCOPY W/ PARTIAL MEDIAL MENISCECTOMY Left 09/2005  . LUMBAR LAMINECTOMY/DECOMPRESSION MICRODISCECTOMY  03/2005   "L4-5"  . POSTERIOR LUMBAR FUSION  10/2003   L5-S1; "plates, screws"  . SHOULDER ARTHROSCOPY Right 08/2011   Debridement of labrum, arthroscopic distal clavicle excision  . SHOULDER OPEN ROTATOR CUFF REPAIR Left 07/2014  . TEE WITHOUT CARDIOVERSION  07/07/2012   Procedure: TRANSESOPHAGEAL ECHOCARDIOGRAM (TEE);  Surgeon: Fay Records, MD;  Location: [facility] ENDOSCOPY;  Service: Cardiovascular;  Laterality: N/A;  . THORACENTESIS N/A 02/25/2021   Procedure: Mathews Robinsons;  Surgeon: Juanito Doom, MD;  Location: [place];  Service: Cardiopulmonary;  Laterality: N/A;  . TONSILLECTOMY AND ADENOIDECTOMY  ~ 1956  . TOTAL KNEE ARTHROPLASTY Left 10/2006  . TRIGGER FINGER RELEASE Left 10/2015  . VIDEO BRONCHOSCOPY WITH ENDOBRONCHIAL ULTRASOUND Left 02/26/2021   Procedure: VIDEO  BRONCHOSCOPY WITH ENDOBRONCHIAL ULTRASOUND;  Surgeon: Candee Furbish, MD;  Location: [facility] ENDOSCOPY;  Service: Pulmonary;  Laterality: Left;  cryoprobe too thanks!    Family History  Problem Relation Age of Onset  . Hypertension Mother   . Heart disease Father   . Heart attack Father   . Prostate cancer Brother   . Parkinson's disease Brother   . Blindness Son   . Breast cancer Neg Hx   . Colon cancer Neg Hx   . Pancreatic cancer Neg Hx     Social History Social History   Tobacco Use  . Smoking status: Former Smoker    Packs/day: 1.00    Years: 34.00    Pack years: 34.00    Types: Cigarettes    Quit date: 05/05/2003    Years since quitting: 17.8  . Smokeless tobacco: Never Used  Vaping Use  . Vaping Use: Never used  Substance Use Topics  . Alcohol use: No    Alcohol/week: 0.0 standard drinks  . Drug use: No    Allergies  Allergen Reactions  . Amoxicillin Swelling  .  Fosinopril Other (See Comments)  . Hydromorphone Other (See Comments) and Nausea Only  . Ampicillin Rash  . Codeine Nausea Only and Other (See Comments)    Heavy amounts cause nausea  . Monopril [Fosinopril Sodium] Other (See Comments)    Muscle aches & pain  . Rosuvastatin   . Percocet [Oxycodone-Acetaminophen] Nausea And Vomiting    Current Outpatient Medications  Medication Sig Dispense Refill  . acetaminophen (TYLENOL 8 HOUR ARTHRITIS PAIN) 650 MG CR tablet Take 1,300 mg by mouth as needed for pain (pain).     Marland Kitchen albuterol (VENTOLIN HFA) 108 (90 Base) MCG/ACT inhaler Inhale 2 puffs into the lungs every 4 hours as needed (Patient taking differently: Inhale 2 puffs into the lungs every 4 (four) hours as needed (allergies).) 6.7 each 0  . atorvastatin (LIPITOR) 40 MG tablet Take 1 tablet (40 mg total) by mouth daily. 90 tablet 3  . azelastine (ASTELIN) 0.1 % nasal spray Place 2 sprays into both nostrils 2 (two) times daily. 30 mL 5  . clindamycin (CLEOCIN) 300 MG capsule Take 300 mg by mouth See admin  instructions. Uses for dental procedures    . diclofenac Sodium (VOLTAREN) 1 % GEL Apply 4 g topically 4 (four) times daily. 100 g 0  . dicyclomine (BENTYL) 20 MG tablet Take 20 mg by mouth as needed for spasms.    Marland Kitchen diltiazem (CARDIZEM) 30 MG tablet Take 1 tablet (30 mg total) by mouth 4 (four) times daily as needed. (Patient taking differently: Take 30 mg by mouth 4 (four) times daily as needed (heart.).) 60 tablet 6  . dofetilide (TIKOSYN) 500 MCG capsule Take 1 capsule (500 mcg total) by mouth 2 (two) times daily. Please keep upcoming appointment in February for future refills. Thank you 180 capsule 3  . ergocalciferol (VITAMIN D2) 1.25 MG (50000 UT) capsule Take 1 capsule (50,000 Units total) by mouth once a week. (Patient taking differently: Take 50,000 Units by mouth once a week. mondays) 12 capsule 1  . feeding supplement (ENSURE ENLIVE / ENSURE PLUS) LIQD Take 237 mLs by mouth 2 (two) times daily between meals. 60 mL 0  . fenofibrate 160 MG tablet TAKE 1 TABLET DAILY. PLEASE KEEP UPCOMING APPT IN MARCH WITH DR. Acie Fredrickson BEFORE ANYMORE REFILLS. (Patient taking differently: Take 160 mg by mouth daily.) 90 tablet 3  . fluticasone (FLONASE) 50 MCG/ACT nasal spray Place 2 sprays into both nostrils daily. 18.2 mL 5  . fluticasone (FLOVENT HFA) 110 MCG/ACT inhaler Inhale 2 puffs into the lungs in the morning and at bedtime. with spacer and rinse mouth afterwards. 1 each 5  . leuprolide, 6 Month, (ELIGARD) 45 MG injection Inject 45 mg into the skin every 6 (six) months.    . levocetirizine (XYZAL) 5 MG tablet Take 5 mg by mouth every evening.    . loperamide (IMODIUM) 2 MG capsule Take 2 mg by mouth as needed for diarrhea or loose stools.    . magnesium oxide (MAG-OX) 400 MG tablet TAKE 1 TABLET BY MOUTH EVERY DAY (Patient taking differently: Take 400 mg by mouth daily.) 90 tablet 2  . metoprolol succinate (TOPROL XL) 25 MG 24 hr tablet Take 1 tablet (25 mg total) by mouth daily. 90 tablet 3  .  Multiple Vitamins-Minerals (MULTIVITAMINS THER. W/MINERALS) TABS Take 1 tablet by mouth daily.    . nitroGLYCERIN (NITROSTAT) 0.4 MG SL tablet Place 1 tablet (0.4 mg total) under the tongue every 5 (five) minutes as needed for chest pain. 25 tablet  5  . omeprazole (PRILOSEC) 10 MG capsule TAKE 1 CAPSULE DAILY. (Patient taking differently: Take 10 mg by mouth daily.) 90 capsule 3  . oxybutynin (DITROPAN) 5 MG tablet Take 1 tablet by mouth 2 (two) times daily as needed for bladder spasms.    . potassium chloride (KLOR-CON) 10 MEQ tablet Take 1 tablet (10 mEq total) by mouth daily. 90 tablet 2  . Saccharomyces boulardii (PROBIOTIC) 250 MG CAPS Take 1 capsule by mouth daily.    . tamsulosin (FLOMAX) 0.4 MG CAPS capsule Take 0.4 mg by mouth daily.    Marland Kitchen topiramate (TOPAMAX) 50 MG tablet Take 2 tablets (100 mg total) by mouth 2 (two) times daily. 360 tablet 3  . traMADol (ULTRAM) 50 MG tablet Take 1 tablet (50 mg total) by mouth every 6 (six) hours as needed for moderate pain. 30 tablet 0  . traZODone (DESYREL) 50 MG tablet Take 0.5 tablets (25 mg total) by mouth at bedtime as needed for sleep. 45 tablet 3  . XARELTO 20 MG TABS tablet TAKE 1 TABLET BY MOUTH EVERY DAY (Patient taking differently: Take 20 mg by mouth daily with supper.) 90 tablet 2   No current facility-administered medications for this visit.    Review of Systems  Constitutional: positive for fatigue Eyes: negative Ears, nose, mouth, throat, and face: negative Respiratory: positive for cough and dyspnea on exertion Cardiovascular: negative Gastrointestinal: positive for diarrhea and nausea Genitourinary:negative Integument/breast: negative Hematologic/lymphatic: negative Musculoskeletal:positive for muscle weakness Neurological: positive for coordination problems and weakness Behavioral/Psych: negative Endocrine: negative Allergic/Immunologic: negative  Physical Exam  FHL:KTGYB, healthy, no distress, well nourished and well  developed SKIN: skin color, texture, turgor are normal, no rashes or significant lesions HEAD: Normocephalic, No masses, lesions, tenderness or abnormalities EYES: normal, PERRLA, Conjunctiva are pink and non-injected EARS: External ears normal, Canals clear OROPHARYNX:no exudate, no erythema and lips, buccal mucosa, and tongue normal  NECK: supple, no adenopathy, no JVD LYMPH:  no palpable lymphadenopathy, no hepatosplenomegaly LUNGS: coarse sounds heard, decreased breath sounds HEART: regular rate & rhythm, no murmurs and no gallops ABDOMEN:abdomen soft, non-tender, obese, normal bowel sounds and no masses or organomegaly BACK: No CVA tenderness, Range of motion is normal EXTREMITIES:no joint deformities, effusion, or inflammation, no edema  NEURO: alert & oriented x 3 with fluent speech, no focal motor/sensory deficits  PERFORMANCE STATUS: ECOG 1  LABORATORY DATA: Lab Results  Component Value Date   WBC 6.3 03/05/2021   HGB 11.5 (L) 03/05/2021   HCT 36.3 (L) 03/05/2021   MCV 94.0 03/05/2021   PLT 331 03/05/2021      Chemistry      Component Value Date/Time   NA 136 02/26/2021 0254   NA 137 01/21/2021 1553   K 4.8 02/26/2021 0254   CL 104 02/26/2021 0254   CO2 25 02/26/2021 0254   BUN 16 02/26/2021 0254   BUN 17 01/21/2021 1553   CREATININE 1.02 02/26/2021 0254   CREATININE 1.06 10/08/2020 1213   CREATININE 1.04 08/30/2016 0840      Component Value Date/Time   CALCIUM 9.5 02/26/2021 0254   ALKPHOS 50 10/08/2020 1213   AST 23 10/08/2020 1213   ALT 19 01/21/2021 1553   ALT 28 10/08/2020 1213   BILITOT 0.6 10/08/2020 1213       RADIOGRAPHIC STUDIES: DG Chest 2 View  Result Date: 02/24/2021 CLINICAL DATA:  Chest pain, shortness of breath. EXAM: CHEST - 2 VIEW COMPARISON:  September 04, 2020. FINDINGS: Stable cardiomegaly. No pneumothorax is noted.  Right lung is clear. Interval development of moderate to large left pleural effusion is noted with associated left  basilar atelectasis. Bony thorax is unremarkable. IMPRESSION: Interval development of moderate to large left pleural effusion with associated left basilar atelectasis. Electronically Signed   By: Marijo Conception M.D.   On: 02/24/2021 11:04   DG Chest Port 1 View  Result Date: 02/25/2021 CLINICAL DATA:  Left pleural effusion and status post bedside thoracentesis. EXAM: PORTABLE CHEST 1 VIEW COMPARISON:  02/24/2021 FINDINGS: Stable heart size and appearance of implanted loop recorder. Significant decrease in volume of left pleural fluid after thoracentesis with probable small volume of fluid remaining. Underlying atelectasis/airspace disease of the left lower lung. No pneumothorax. No pulmonary edema. IMPRESSION: Significant decrease in left pleural fluid volume after thoracentesis. No pneumothorax. Underlying atelectasis/airspace disease of the left lower lung. Electronically Signed   By: Aletta Edouard M.D.   On: 02/25/2021 16:51   ECHOCARDIOGRAM COMPLETE  Result Date: 02/25/2021    ECHOCARDIOGRAM REPORT   Patient Name:   Corey [name] Date of Exam: 02/25/2021 Medical Rec #:  [id number]   Height:       73.0 in Accession #:    [id number]  Weight:       262.0 lb Date of Birth:  December 20, 1945  BSA:          2.413 m Patient Age:    64 years    BP:           131/80 mmHg Patient Gender: M           HR:           67 bpm. Exam Location:  Inpatient Procedure: 2D Echo, Cardiac Doppler and Color Doppler Indications:    CHF  History:        Patient has prior history of Echocardiogram examinations, most                 recent 07/19/2019. CAD, Arrythmias:Atrial Fibrillation; Risk                 Factors:Hypertension, Former Smoker and Dyslipidemia.  Sonographer:    Cammy Brochure Referring Phys: [id number] Lequita Halt  Sonographer Comments: Technically difficult study due to poor echo windows. Image acquisition challenging due to patient body habitus. IMPRESSIONS  1. Left ventricular ejection fraction, by estimation, is 60 to  65%. The left ventricle has normal function. The left ventricle has no regional wall motion abnormalities. Left ventricular diastolic parameters were normal.  2. Right ventricular systolic function is normal. The right ventricular size is normal.  3. Large pleural effusion in the left lateral region.  4. The mitral valve is normal in structure. No evidence of mitral valve regurgitation. No evidence of mitral stenosis.  5. The aortic valve is normal in structure. Aortic valve regurgitation is not visualized. No aortic stenosis is present.  6. The inferior vena cava is dilated in size with >50% respiratory variability, suggesting right atrial pressure of 8 mmHg. Comparison(s): No significant cardiac change from prior study. Prior images reviewed side by side. There is a new large left pleural effusion with large nodular pleural based masses. FINDINGS  Left Ventricle: Left ventricular ejection fraction, by estimation, is 60 to 65%. The left ventricle has normal function. The left ventricle has no regional wall motion abnormalities. The left ventricular internal cavity size was normal in size. There is  no left ventricular hypertrophy. Left ventricular diastolic parameters were normal. Right Ventricle: The right ventricular size  is normal. No increase in right ventricular wall thickness. Right ventricular systolic function is normal. Left Atrium: Left atrial size was normal in size. Right Atrium: Right atrial size was normal in size. Pericardium: There is no evidence of pericardial effusion. Mitral Valve: The mitral valve is normal in structure. No evidence of mitral valve regurgitation. No evidence of mitral valve stenosis. Tricuspid Valve: The tricuspid valve is normal in structure. Tricuspid valve regurgitation is not demonstrated. No evidence of tricuspid stenosis. Aortic Valve: The aortic valve is normal in structure. Aortic valve regurgitation is not visualized. No aortic stenosis is present. Aortic valve mean  gradient measures 5.0 mmHg. Aortic valve peak gradient measures 9.2 mmHg. Aortic valve area, by VTI measures 3.69 cm. Pulmonic Valve: The pulmonic valve was normal in structure. Pulmonic valve regurgitation is not visualized. No evidence of pulmonic stenosis. Aorta: The aortic root is normal in size and structure. Venous: The inferior vena cava is dilated in size with greater than 50% respiratory variability, suggesting right atrial pressure of 8 mmHg. IAS/Shunts: No atrial level shunt detected by color flow Doppler. Additional Comments: There is a large pleural effusion in the left lateral region.  LEFT VENTRICLE PLAX 2D LVIDd:         5.70 cm  Diastology LVIDs:         3.80 cm  LV e' medial:    12.62 cm/s LV PW:         1.10 cm  LV E/e' medial:  8.6 LV IVS:        1.20 cm  LV e' lateral:   8.00 cm/s LVOT diam:     2.40 cm  LV E/e' lateral: 13.5 LV SV:         107 LV SV Index:   44 LVOT Area:     4.52 cm  RIGHT VENTRICLE            IVC RV S prime:     8.38 cm/s  IVC diam: 2.80 cm LEFT ATRIUM         Index LA diam:    3.00 cm 1.24 cm/m  AORTIC VALVE AV Area (Vmax):    4.20 cm AV Area (Vmean):   3.88 cm AV Area (VTI):     3.69 cm AV Vmax:           152.00 cm/s AV Vmean:          102.000 cm/s AV VTI:            0.289 m AV Peak Grad:      9.2 mmHg AV Mean Grad:      5.0 mmHg LVOT Vmax:         141.00 cm/s LVOT Vmean:        87.500 cm/s LVOT VTI:          0.236 m LVOT/AV VTI ratio: 0.82  AORTA Ao Root diam: 3.80 cm Ao Asc diam:  3.60 cm MITRAL VALVE MV Area (PHT): 3.31 cm     SHUNTS MV Decel Time: 229 msec     Systemic VTI:  0.24 m MV E velocity: 108.00 cm/s  Systemic Diam: 2.40 cm MV A velocity: 87.50 cm/s MV E/A ratio:  1.23 Mihai Croitoru MD Electronically signed by Sanda Klein MD Signature Date/Time: 02/25/2021/2:06:32 PM    Final    CUP PACEART REMOTE DEVICE CHECK  Result Date: 02/25/2021 ILR summary report received. Battery status OK. Normal device function. No new symptom, tachy, brady, or pause  episodes. No new  AF episodes. Monthly summary reports and ROV/PRN.  RP  CT CHEST ABDOMEN PELVIS WO CONTRAST  Result Date: 02/24/2021 CLINICAL DATA:  LEFT side chest and abdominal pain with nausea. History of coronary artery disease post coronary PTCA, hypertension, GERD, atrial fibrillation, prostate cancer, former smoker EXAM: CT CHEST, ABDOMEN AND PELVIS WITHOUT CONTRAST TECHNIQUE: Multidetector CT imaging of the chest, abdomen and pelvis was performed following the standard protocol without IV contrast. Sagittal and coronal MPR images reconstructed from axial data set. COMPARISON:  CT abdomen and pelvis 02/18/2020, CT chest 12/10/2006 FINDINGS: CT CHEST FINDINGS Cardiovascular: Atherosclerotic calcifications aorta, coronary arteries and proximal great vessels. Aneurysmal dilatation ascending thoracic aorta 4.1 cm transverse image 28 previously 3.9 cm. Heart unremarkable. No pericardial effusion. Mediastinum/Nodes: Esophagus unremarkable. New enlarged azygoesophageal recess lymph nodes measuring 19 mm image 37 and 19 mm image 35. Normal sized paratracheal nodes. 11 mm AP window node image 25 new. Base of cervical region unremarkable. Lungs/Pleura: Large LEFT pleural effusion. Suspected LEFT infrahilar mass extending into medial LEFT lower lobe with narrowing of LEFT lower lobe bronchi, ill-defined due to adjacent subtotal atelectasis in LEFT lower lobe with partial atelectasis of LEFT upper lobe. Minimal interstitial prominence at RIGHT lung base. No acute infiltrate or pneumothorax. Few small subpleural nodular foci in LEFT upper lobe, 5 mm image 69, 4 mm image 72, 5 mm image 79. Questionable 11 mm subpleural nodule in lingula image 89. Musculoskeletal: No acute osseous findings. CT ABDOMEN PELVIS FINDINGS Hepatobiliary: Gallbladder and liver normal appearance Pancreas: Normal appearance Spleen: Normal appearance Adrenals/Urinary Tract: Adrenal glands, kidneys, ureters, and bladder normal appearance  Stomach/Bowel: Sigmoid diverticulosis without evidence of diverticulitis. Stomach decompressed. Normal appendix. Vascular/Lymphatic: Atherosclerotic calcifications of aorta and iliac arteries. Short segment of displaced atherosclerotic calcification at the mid to distal abdominal aorta, previously assessed by CT angio exam, felt to represent irregular plaque rather than aneurysm or penetrating ulcer, please refer to prior study. Aorta normal caliber. No adenopathy. Reproductive: Few metallic clips at the prostate gland, which is otherwise unremarkable. Seminal vesicles normal appearance. Other: No free air or free fluid. Small RIGHT inguinal hernia containing fat. Musculoskeletal: No acute osseous findings. IMPRESSION: New large LEFT pleural effusion with subtotal atelectasis of LEFT lower lobe and partial atelectasis of LEFT upper lobe. Few small subpleural nodular foci in LEFT lung, largest 11 mm, nonspecific, new since 2008. New azygoesophageal recess adenopathy. Suspicion of a LEFT infrahilar mass extending into LEFT lower lobe medially with narrowing of LEFT lower lobe bronchi, concerning for LEFT infrahilar neoplasm. Sigmoid diverticulosis without evidence of diverticulitis. Small RIGHT inguinal hernia containing fat. No acute intra-abdominal or intrapelvic abnormalities. Aortic Atherosclerosis (ICD10-I70.0). Electronically Signed   By: Lavonia Dana M.D.   On: 02/24/2021 14:37    ASSESSMENT: This is a very pleasant 75 years old white male recently diagnosed with a stage IV (T1b, N1, M1 a) non-small cell lung cancer favoring adenocarcinoma presented with left lung pulmonary nodule in addition to left hilar adenopathy and malignant left pleural effusion diagnosed in May 2022.  Pending further staging work-up.   PLAN: I had a lengthy discussion with the patient and his wife today about his current disease stage, prognosis and treatment options.  I personally and independently reviewed the scan images and  discussed the result and showed the images to the patient and his wife. I recommended for the patient to complete the staging work-up by ordering a PET scan as well as MRI of the brain to rule out any other metastatic disease. I  also requested the patient to have blood test sent to [facility] for molecular studies followed by tissue if needed. The patient will see Dr. Tammi Klippel today for evaluation and consideration of short course of palliative radiotherapy to the obstructive left lung mass. The patient also has a history of high-grade prostate cancer diagnosed last year and he has family history of prostate cancer in addition to his current diagnosis with lung cancer.  I will refer him for genetic counseling. I will see the patient back for follow-up visit in around 2 weeks for more detailed discussion of his treatment options based on the final staging work-up and molecular studies. If the patient has no actionable mutations, we will treat him with systemic chemotherapy with carboplatin for AUC of 5, Alimta 500 Mg/M2 and Keytruda 200 Mg IV every 3 weeks versus palliative care and hospice referral. For the recurrent left pleural effusion, he is scheduled to see Dr. Valeta Harms next week and he was advised to call Dr. Valeta Harms sooner if he started having more shortness of breath in the interval. He was advised to call immediately if he has any other concerning symptoms in the interval. The patient voices understanding of current disease status and treatment options and is in agreement with the current care plan.  All questions were answered. The patient knows to call the clinic with any problems, questions or concerns. We can certainly see the patient much sooner if necessary.  Thank you so much for allowing me to participate in the care of Boubacar Lerette. I will continue to follow up the patient with you and assist in his care.  The total time spent in the appointment was 90 minutes.  Disclaimer: This  note was dictated with voice recognition software. Similar sounding words can inadvertently be transcribed and may not be corrected upon review.   Eilleen Kempf March 05, 2021, 2:14 PM

## 2021-03-06 ENCOUNTER — Telehealth: Payer: Self-pay | Admitting: Internal Medicine

## 2021-03-06 ENCOUNTER — Telehealth: Payer: Self-pay

## 2021-03-06 ENCOUNTER — Telehealth: Payer: Self-pay | Admitting: Cardiovascular Disease

## 2021-03-06 ENCOUNTER — Ambulatory Visit
Admission: RE | Admit: 2021-03-06 | Discharge: 2021-03-06 | Disposition: A | Discharge status: Home / Self Care | Payer: Medicare PPO | Source: Ambulatory Visit | Attending: Radiation Oncology | Admitting: Radiation Oncology

## 2021-03-06 DIAGNOSIS — C3432 Malignant neoplasm of lower lobe, left bronchus or lung: Secondary | ICD-10-CM | POA: Diagnosis not present

## 2021-03-06 DIAGNOSIS — Z510 Encounter for antineoplastic radiation therapy: Secondary | ICD-10-CM | POA: Diagnosis not present

## 2021-03-06 DIAGNOSIS — Z87891 Personal history of nicotine dependence: Secondary | ICD-10-CM | POA: Diagnosis not present

## 2021-03-06 DIAGNOSIS — G4733 Obstructive sleep apnea (adult) (pediatric): Secondary | ICD-10-CM | POA: Diagnosis not present

## 2021-03-06 LAB — CYTOLOGY - NON PAP

## 2021-03-06 NOTE — Telephone Encounter (Signed)
I called and discussed with patient.  I agree with holding Xarelto for 3 days prior to his bronchoscopy.

## 2021-03-06 NOTE — Telephone Encounter (Signed)
Patient called in to get Dr. Acie Fredrickson advice on rather he should stop taking XARELTO 20 MG TABS tablet today for his procedure on Monday. Patient was instructed to do so by another dr, patient feels that's a long time to go with out his medication. Please advise

## 2021-03-06 NOTE — Telephone Encounter (Signed)
Spoke with JD via [employer]. Patient will be having the procedure done on Monday 6/6 at 46 in office. I called and spoke with the patient to make sure he was aware of this, he is.   Patient is also aware to stop his Xarelto starting tomorrow instead of today. He stated that his cardiologist agreed with this as well.   Nothing further needed.

## 2021-03-06 NOTE — Telephone Encounter (Signed)
-----   Message from Freddi Starr, MD sent at 03/06/2021 11:41 AM EDT ----- We can schedule him for Monday 6/6 in the afternoon for thoracentesis with Dr. Silas Flood.   Veta Dambrosia or Triage Team, can you please help get this procedure scheduled for the endo suite at [facility]. The patient will need to stop taking his Xarelto starting Saturday 6/4.   Thanks, Couper  ----- Message ----- From: Candee Furbish, MD Sent: 03/05/2021   6:17 PM EDT To: Freeman Caldron, PA-C, Valrie Hart, RN, #  Thanks Ashlyn  I will cc my partner Dr. Erin Fulling to see if he can arrange for in office otherwise may have to be IR.  Dan  ----- Message ----- From: Freeman Caldron, PA-C Sent: 03/05/2021   4:24 PM EDT To: Valrie Hart, RN, Curt Bears, MD, #  Just to keep everyone in the loop regarding this patient- he is tentatively scheduled him for CT SIM/treatment planning at 2:30pm on Friday 03/06/21, in anticipation of beginning his 2 week course of daily palliative radiotherapy to the obstructing lung mass on Tuesday, 03/10/21. It would be ideal, if possible, for him to have a repeat thoracentesis prior to starting radiation and may ultimately need to be considered for pleurX catheter placement. -Ashlyn

## 2021-03-06 NOTE — Telephone Encounter (Signed)
This has already been scheduled for in office  See phone note dates 03/05/21-   Elie Confer, [facility]     03/05/21 4:27 PM Note I have called and spoke with pt and he is aware to come in on Monday to the office and see Panora stated that he will drain him in the office around 1215.  Pt voiced his understanding and he will go to the ER if he feels that he gets increased [facility] or other issues with breathing.  Nothing further is needed.

## 2021-03-06 NOTE — Telephone Encounter (Signed)
Scheduled appt per 6/2 sch msg. Pt aware.

## 2021-03-07 DIAGNOSIS — C3432 Malignant neoplasm of lower lobe, left bronchus or lung: Secondary | ICD-10-CM | POA: Insufficient documentation

## 2021-03-07 NOTE — Progress Notes (Signed)
  Radiation Oncology         ([phone]) [phone] ________________________________  Name: Corey [name] MRN: [id number]  Date: 03/06/2021  DOB: 1946/07/30  SIMULATION AND TREATMENT PLANNING NOTE    ICD-10-CM   1. 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  NARRATIVE:  The patient was brought to the [facility].  Identity was confirmed.  All relevant records and images related to the planned course of therapy were reviewed.  The patient freely provided informed written consent to proceed with treatment after reviewing the details related to the planned course of therapy. The consent form was witnessed and verified by the simulation staff.  Then, the patient was set-up in a stable reproducible  supine position for radiation therapy.  CT images were obtained.  Surface markings were placed.  The CT images were loaded into the planning software.  Then the target and avoidance structures were contoured.  Treatment planning then occurred.  The radiation prescription was entered and confirmed.  Then, I designed and supervised the construction of a total of 4 medically necessary complex treatment devices, including a BodyFix immobilization mold custom fitted to the patient along with 3 multileaf collimators conformally shaped radiation around the treatment target while shielding critical structures such as the heart and spinal cord maximally.  I have requested : 3D Simulation  I have requested a DVH of the following structures: Left lung, right lung, spinal cord, heart, esophagus, and target.  I have ordered:Nutrition Consult  SPECIAL TREATMENT PROCEDURE:  The planned course of therapy using radiation constitutes a special treatment procedure. Special care is required in the management of this patient for the following reasons.  The patient will be receiving  concurrent chemotherapy requiring careful monitoring for increased toxicities of treatment including periodic laboratory values.  The special nature of the planned course of radiotherapy will require increased physician supervision and oversight to ensure patient's safety with optimal treatment outcomes.  PLAN:  The patient will receive 30 Gy in 10 fractions.  ________________________________  Sheral Apley Tammi Klippel, M.D.

## 2021-03-08 ENCOUNTER — Other Ambulatory Visit: Payer: Self-pay | Admitting: Allergy

## 2021-03-09 ENCOUNTER — Other Ambulatory Visit: Payer: Self-pay

## 2021-03-09 ENCOUNTER — Other Ambulatory Visit: Payer: Self-pay | Admitting: Internal Medicine

## 2021-03-09 ENCOUNTER — Ambulatory Visit (INDEPENDENT_AMBULATORY_CARE_PROVIDER_SITE_OTHER): Payer: Medicare PPO | Admitting: Pulmonary Disease

## 2021-03-09 ENCOUNTER — Telehealth: Payer: Self-pay | Admitting: Pulmonary Disease

## 2021-03-09 ENCOUNTER — Encounter: Payer: Self-pay | Admitting: Pulmonary Disease

## 2021-03-09 ENCOUNTER — Telehealth: Payer: Self-pay | Admitting: Internal Medicine

## 2021-03-09 ENCOUNTER — Ambulatory Visit (INDEPENDENT_AMBULATORY_CARE_PROVIDER_SITE_OTHER): Payer: Medicare PPO

## 2021-03-09 VITALS — BP 120/70 | HR 97 | Temp 97.4°F | Ht 73.5 in | Wt 260.2 lb

## 2021-03-09 DIAGNOSIS — J910 Malignant pleural effusion: Secondary | ICD-10-CM | POA: Diagnosis not present

## 2021-03-09 DIAGNOSIS — C3432 Malignant neoplasm of lower lobe, left bronchus or lung: Secondary | ICD-10-CM

## 2021-03-09 DIAGNOSIS — Z87891 Personal history of nicotine dependence: Secondary | ICD-10-CM | POA: Diagnosis not present

## 2021-03-09 DIAGNOSIS — J90 Pleural effusion, not elsewhere classified: Secondary | ICD-10-CM

## 2021-03-09 DIAGNOSIS — Z7901 Long term (current) use of anticoagulants: Secondary | ICD-10-CM

## 2021-03-09 DIAGNOSIS — Z510 Encounter for antineoplastic radiation therapy: Secondary | ICD-10-CM | POA: Diagnosis not present

## 2021-03-09 DIAGNOSIS — C3492 Malignant neoplasm of unspecified part of left bronchus or lung: Secondary | ICD-10-CM

## 2021-03-09 MED ORDER — LORAZEPAM 0.5 MG PO TABS: ORAL_TABLET | ORAL | 0 refills | Status: DC

## 2021-03-09 NOTE — Patient Instructions (Addendum)
Thank you for visiting Dr. Valeta Harms at [facility]. Today we recommend the following:  We will plan to have pleurx placed on 03/17/2021. We will call you with more information.   Please call with any issues or concerns after your procedure today. Increased shortness of breath, fever, etc let us know and also seek care at urgent care or emergency room.       Please do your part to reduce the spread of COVID-19.

## 2021-03-09 NOTE — Telephone Encounter (Signed)
Spoke to patient to go over upcoming appointments, the MRI and the PET, the patient informed me that he is claustrophobic and would need some medication for the MRI, I sent a message over to Dr. [name] and his Navigator to inform them of this request

## 2021-03-09 NOTE — Progress Notes (Addendum)
Thoracentesis  Procedure Note  Corey [name]  [id number]  15-Feb-1946  Date:03/09/21  Time:10:27 AM   Provider Performing:Crickett Abbett L Lasean Rahming   Procedure: Thoracentesis with imaging guidance ([id number])  Indication(s) Pleural Effusion  Consent Risks of the procedure as well as the alternatives and risks of each were explained to the patient and/or caregiver.  Consent for the procedure was obtained and is signed in the bedside chart  Anesthesia Topical only with 1% lidocaine   Time Out Verified patient identification, verified procedure, site/side was marked, verified correct patient position, special equipment/implants available, medications/allergies/relevant history reviewed, required imaging and test results available.  Sterile Technique Maximal sterile technique including full sterile barrier drape, hand hygiene, sterile gown, sterile gloves, mask, hair covering, sterile ultrasound probe cover (if used).  Procedure Description Ultrasound was used to identify appropriate pleural anatomy for placement and overlying skin marked.  Area of drainage cleaned and draped in sterile fashion. Lidocaine was used to anesthetize the skin and subcutaneous tissue.  3400 cc's of sanguinous (merlot colored) appearing fluid was drained from the left pleural space. Catheter then removed and bandaid applied to site.  Complications/Tolerance None; patient tolerated the procedure well. Chest X-ray is ordered to confirm no post-procedural complication.  EBL Minimal  Specimen(s) Pleural fluid Sanguinous appearing      Corey Nash, DO [facility] 03/09/2021 10:28 AM

## 2021-03-09 NOTE — H&P (View-Only) (Signed)
Synopsis: Referred in June 2022 for malignant pleural effusion, PCP: By [name], MD  Subjective:   PATIENT ID: Corey [name] GENDER: male DOB: 1945-10-16, MRN: [id number]  Chief Complaint  Patient presents with  . Follow-up    Thora procedure, increased shortness of breath    This is a 75 year old gentleman, past medical history of coronary artery disease, coronary artery disease, gastroesophageal reflux, atrial fibrillation on Xarelto, OSA on CPAP.  Patient was recently admitted to the hospital on 02/24/2021.  Discharge summary reviewed from Dr. Maye Hides.  Patient was admitted for evaluation of dyspnea.  Found to have large left-sided pleural effusion with left-sided infrahilar mass.  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  Patient was taken for thoracentesis and bronchoscopy by Dr. Tamala Julian.  Pathology was consistent with non-small cell carcinoma.  Patient had subsequent follow-up medical oncology on 03/05/2021.  Met with Dr. [name].  Office note reviewed.  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  Patient scheduled for follow-up with me today due to complaints of ongoing shortness of breath and dyspnea and discussion of outpatient thoracentesis.  And management of recurrent pleural effusion.   Past Medical History:  Diagnosis Date  . Anemia   . Anxiety   . Arthritis    "back, right knee, hands, ankles, neck" (05/31/2016)  . Carotid artery disease ([place]) 08/30/2016   Carotid US 11/19: R 1-39, L 40-59 // Carotid US 08/2019: R 1-39; L 40-59 // Carotid US 11/21: R 1-39; L 40-59>> repeat 1 year   . Chronic lower back pain   . Coronary atherosclerosis of native coronary artery    a. BMS to [facility] 2004 and 2007, otherwise mild nonobstructive disease. EF normal.  . Diverticulitis   . Dyslipidemia   .  Dyspnea   . Essential hypertension, benign   . GERD (gastroesophageal reflux disease)   . Lumbar radiculopathy, chronic 02/04/2015   Right L5  . Obesity   . OSA on CPAP    uses cpap  . Paroxysmal atrial fibrillation ([place])    a. Discovered after stroke.  . Pneumonia 01/1996  . PONV (postoperative nausea and vomiting)   . Prostate CA ([place])   . Recurrent upper respiratory infection (URI)   . Stroke ([place]) 07/2012   no deficits  . Visit for monitoring Tikosyn therapy 09/18/2019     Family History  Problem Relation Age of Onset  . Hypertension Mother   . Heart disease Father   . Heart attack Father   . Prostate cancer Brother   . Parkinson's disease Brother   . Blindness Son   . Breast cancer Neg Hx   . Colon cancer Neg Hx   . Pancreatic cancer Neg Hx      Past Surgical History:  Procedure Laterality Date  . ADENOIDECTOMY    . ANTERIOR CERVICAL DECOMP/DISCECTOMY FUSION  07/2001; 10/2002   "C5-6; C6-7; redo"  . BACK SURGERY    . CARPAL TUNNEL RELEASE Left 10/2015  . COLONOSCOPY W/ POLYPECTOMY  02/2014  . CORONARY ANGIOPLASTY WITH STENT PLACEMENT  05/2003; 12/2005   "mid RCA; mid RCA"  . FINE NEEDLE ASPIRATION  02/26/2021   Procedure: FINE NEEDLE ASPIRATION (FNA) LINEAR;  Surgeon: Candee Furbish, MD;  Location: [facility] ENDOSCOPY;  Service: Pulmonary;;  . HAND SURGERY  02/2019   LEFT HAND  . implantable  loop recorder placement  02/27/2020   [employer] Reveal [place] model [id number]  implantable loop recorder implanted by Dr Rayann Heman for afib management and evaluation of presyncope  . JOINT REPLACEMENT    . KNEE ARTHROSCOPY Left 10/2005  . KNEE ARTHROSCOPY W/ PARTIAL MEDIAL MENISCECTOMY Left 09/2005  . LUMBAR LAMINECTOMY/DECOMPRESSION MICRODISCECTOMY  03/2005   "L4-5"  . POSTERIOR LUMBAR FUSION  10/2003   L5-S1; "plates, screws"  . SHOULDER ARTHROSCOPY Right 08/2011   Debridement of labrum, arthroscopic distal clavicle excision  . SHOULDER OPEN ROTATOR CUFF REPAIR Left 07/2014  .  TEE WITHOUT CARDIOVERSION  07/07/2012   Procedure: TRANSESOPHAGEAL ECHOCARDIOGRAM (TEE);  Surgeon: Fay Records, MD;  Location[facility] END[facility];  Service: Cardiovascular;  Laterality: N/A;  . THORACENTESIS N/A 02/25/2021   Procedure: Mathews Robinsons;  Surgeon: Juanito Doom, MD;  Location: [place];  Service: Cardiopulmonary;  Laterality: N/A;  . TONSILLECTOMY AND ADENOIDECTOMY  ~ 1956  . TOTAL KNEE ARTHROPLASTY Left 10/2006  . TRIGGER FINGER RELEASE Left 10/2015  . VIDEO BRONCHOSCOPY WITH ENDOBRONCHIAL ULTRASOUND Left 02/26/2021   Procedure: VIDEO BRONCHOSCOPY WITH ENDOBRONCHIAL ULTRASOUND;  Surgeon: Candee Furbish, MD;  Location: [facility] ENDOSCOPY;  Service: Pulmonary;  Laterality: Left;  cryoprobe too thanks!    Social History   Socioeconomic History  . Marital status: Married    Spouse name: [name]  . Number of children: 3  . Years of education: 44  . Highest education level: Not on file  Occupational History  . Occupation: Retired    Fish farm manager: DISABLED     Comment: Works at [employer] part time  Tobacco Use  . Smoking status: Former Smoker    Packs/day: 1.00    Years: 34.00    Pack years: 34.00    Types: Cigarettes    Quit date: 05/05/2003    Years since quitting: 17.8  . Smokeless tobacco: Never Used  Vaping Use  . Vaping Use: Never used  Substance and Sexual Activity  . Alcohol use: No    Alcohol/week: 0.0 standard drinks  . Drug use: No  . Sexual activity: Not Currently  Other Topics Concern  . Not on file  Social History Narrative   Patient lives at home with spouse.   Caffeine Use:    Social Determinants of Health   Financial Resource Strain: Not on file  Food Insecurity: Not on file  Transportation Needs: Not on file  Physical Activity: Not on file  Stress: Not on file  Social Connections: Not on file  Intimate Partner Violence: Not on file     Allergies  Allergen Reactions  . Amoxicillin Swelling  . Fosinopril Other (See Comments)  . Ampicillin Rash   . Monopril [Fosinopril Sodium] Other (See Comments)    Muscle aches & pain  . Rosuvastatin      Outpatient Medications Prior to Visit  Medication Sig Dispense Refill  . acetaminophen (TYLENOL 8 HOUR ARTHRITIS PAIN) 650 MG CR tablet Take 1,300 mg by mouth as needed for pain (pain).     Marland Kitchen albuterol (VENTOLIN HFA) 108 (90 Base) MCG/ACT inhaler Inhale 2 puffs into the lungs every 4 hours as needed (Patient taking differently: Inhale 2 puffs into the lungs every 4 (four) hours as needed (allergies).) 6.7 each 0  . ALPRAZolam (XANAX) 0.5 MG tablet 1 tablet    . atorvastatin (LIPITOR) 40 MG tablet Take 1 tablet (40 mg total) by mouth daily. 90 tablet 3  . azelastine (ASTELIN) 0.1 % nasal spray Place 2 sprays into both nostrils  2 (two) times daily. 30 mL 5  . clindamycin (CLEOCIN) 300 MG capsule Take 300 mg by mouth See admin instructions. Uses for dental procedures    . diclofenac Sodium (VOLTAREN) 1 % GEL Apply 4 g topically 4 (four) times daily. 100 g 0  . dicyclomine (BENTYL) 20 MG tablet Take 20 mg by mouth as needed for spasms.    . diltiazem (CARDIZEM) 30 MG tablet Take 1 tablet (30 mg total) by mouth 4 (four) times daily as needed. (Patient taking differently: Take 30 mg by mouth 4 (four) times daily as needed (heart.).) 60 tablet 6  . dofetilide (TIKOSYN) 500 MCG capsule Take 1 capsule (500 mcg total) by mouth 2 (two) times daily. Please keep upcoming appointment in February for future refills. Thank you 180 capsule 3  . ergocalciferol (VITAMIN D2) 1.25 MG (50000 UT) capsule Take 1 capsule (50,000 Units total) by mouth once a week. (Patient taking differently: Take 50,000 Units by mouth once a week. mondays) 12 capsule 1  . feeding supplement (ENSURE ENLIVE / ENSURE PLUS) LIQD Take 237 mLs by mouth 2 (two) times daily between meals. 60 mL 0  . fenofibrate 160 MG tablet TAKE 1 TABLET DAILY. PLEASE KEEP UPCOMING APPT IN MARCH WITH DR. NAHSER BEFORE ANYMORE REFILLS. (Patient taking differently:  Take 160 mg by mouth daily.) 90 tablet 3  . fluticasone (FLONASE) 50 MCG/ACT nasal spray Place 2 sprays into both nostrils daily. 18.2 mL 5  . fluticasone (FLOVENT HFA) 110 MCG/ACT inhaler Inhale 2 puffs into the lungs in the morning and at bedtime. with spacer and rinse mouth afterwards. 1 each 5  . leuprolide, 6 Month, (ELIGARD) 45 MG injection Inject 45 mg into the skin every 6 (six) months.    . levocetirizine (XYZAL) 5 MG tablet Take 5 mg by mouth every evening.    . loperamide (IMODIUM) 2 MG capsule Take 2 mg by mouth as needed for diarrhea or loose stools.    . magnesium oxide (MAG-OX) 400 MG tablet TAKE 1 TABLET BY MOUTH EVERY DAY (Patient taking differently: Take 400 mg by mouth daily.) 90 tablet 2  . metoprolol succinate (TOPROL XL) 25 MG 24 hr tablet Take 1 tablet (25 mg total) by mouth daily. 90 tablet 3  . Multiple Vitamins-Minerals (MULTIVITAMINS THER. W/MINERALS) TABS Take 1 tablet by mouth daily.    . nitroGLYCERIN (NITROSTAT) 0.4 MG SL tablet Place 1 tablet (0.4 mg total) under the tongue every 5 (five) minutes as needed for chest pain. 25 tablet 5  . omeprazole (PRILOSEC) 10 MG capsule TAKE 1 CAPSULE DAILY. (Patient taking differently: Take 10 mg by mouth daily.) 90 capsule 3  . oxybutynin (DITROPAN) 5 MG tablet Take 1 tablet by mouth 2 (two) times daily as needed for bladder spasms.    . potassium chloride (KLOR-CON) 10 MEQ tablet Take 1 tablet (10 mEq total) by mouth daily. 90 tablet 2  . Saccharomyces boulardii (PROBIOTIC) 250 MG CAPS Take 1 capsule by mouth daily.    . tamsulosin (FLOMAX) 0.4 MG CAPS capsule Take 0.4 mg by mouth daily.    . topiramate (TOPAMAX) 50 MG tablet Take 2 tablets (100 mg total) by mouth 2 (two) times daily. 360 tablet 3  . traMADol (ULTRAM) 50 MG tablet Take 1 tablet (50 mg total) by mouth every 6 (six) hours as needed for moderate pain. 30 tablet 0  . traZODone (DESYREL) 50 MG tablet Take 0.5 tablets (25 mg total) by mouth at bedtime as needed for    sleep. 45 tablet 3  . XARELTO 20 MG TABS tablet TAKE 1 TABLET BY MOUTH EVERY DAY (Patient taking differently: Take 20 mg by mouth daily with supper.) 90 tablet 2   No facility-administered medications prior to visit.    Review of Systems  Constitutional: Negative for chills, fever, malaise/fatigue and weight loss.  HENT: Negative for hearing loss, sore throat and tinnitus.   Eyes: Negative for blurred vision and double vision.  Respiratory: Positive for shortness of breath. Negative for cough, hemoptysis, sputum production, wheezing and stridor.   Cardiovascular: Negative for chest pain, palpitations, orthopnea, leg swelling and PND.  Gastrointestinal: Negative for abdominal pain, constipation, diarrhea, heartburn, nausea and vomiting.  Genitourinary: Negative for dysuria, hematuria and urgency.  Musculoskeletal: Negative for joint pain and myalgias.  Skin: Negative for itching and rash.  Neurological: Negative for dizziness, tingling, weakness and headaches.  Endo/Heme/Allergies: Negative for environmental allergies. Does not bruise/bleed easily.  Psychiatric/Behavioral: Negative for depression. The patient is not nervous/anxious and does not have insomnia.   All other systems reviewed and are negative.    Objective:  Physical Exam Vitals reviewed.  Constitutional:      General: He is not in acute distress.    Appearance: He is well-developed.  HENT:     Head: Normocephalic and atraumatic.  Eyes:     General: No scleral icterus.    Conjunctiva/sclera: Conjunctivae normal.     Pupils: Pupils are equal, round, and reactive to light.  Neck:     Vascular: No JVD.     Trachea: No tracheal deviation.  Cardiovascular:     Rate and Rhythm: Normal rate and regular rhythm.     Heart sounds: Normal heart sounds. No murmur heard.   Pulmonary:     Effort: Pulmonary effort is normal. No tachypnea, accessory muscle usage or respiratory distress.     Breath sounds: No stridor. No  wheezing, rhonchi or rales.     Comments: Absent breath sounds left base, Abdominal:     General: Bowel sounds are normal. There is no distension.     Palpations: Abdomen is soft.     Tenderness: There is no abdominal tenderness.  Musculoskeletal:        General: No tenderness.     Cervical back: Neck supple.  Lymphadenopathy:     Cervical: No cervical adenopathy.  Skin:    General: Skin is warm and dry.     Capillary Refill: Capillary refill takes less than 2 seconds.     Findings: No rash.  Neurological:     Mental Status: He is alert and oriented to person, place, and time.  Psychiatric:        Behavior: Behavior normal.      Vitals:   03/09/21 0832  BP: 120/70  Pulse: 97  Temp: (!) 97.4 F (36.3 C)  TempSrc: Temporal  SpO2: 96%  Weight: 260 lb 3.2 oz (118 kg)  Height: 6' 1.5" (1.867 m)   96% on RA BMI Readings from Last 3 Encounters:  03/09/21 33.86 kg/m  03/05/21 34.41 kg/m  02/26/21 34.57 kg/m   Wt Readings from Last 3 Encounters:  03/09/21 260 lb 3.2 oz (118 kg)  03/05/21 260 lb 12.8 oz (118.3 kg)  02/26/21 262 lb (118.8 kg)     CBC    Component Value Date/Time   WBC 6.3 03/05/2021 1352   WBC 6.1 02/26/2021 0254   RBC 3.86 (L) 03/05/2021 1352   HGB 11.5 (L) 03/05/2021 1352   HGB 12.3 (L)   06/25/2020 1336   HCT 36.3 (L) 03/05/2021 1352   HCT 35.6 (L) 06/25/2020 1336   PLT 331 03/05/2021 1352   PLT 275 06/25/2020 1336   MCV 94.0 03/05/2021 1352   MCV 93 06/25/2020 1336   MCH 29.8 03/05/2021 1352   MCHC 31.7 03/05/2021 1352   RDW 15.9 (H) 03/05/2021 1352   RDW 14.9 06/25/2020 1336   LYMPHSABS 0.8 03/05/2021 1352   MONOABS 0.7 03/05/2021 1352   EOSABS 0.3 03/05/2021 1352   BASOSABS 0.1 03/05/2021 1352    Chest Imaging: Chest x-ray completed today 03/09/2021: Reduction in left-sided pleural effusion. No pneumothorax The patient's images have been independently reviewed by me.    Pulmonary Functions Testing Results: No flowsheet data  found.  FeNO:   Pathology:   Echocardiogram:   Heart Catheterization:     Assessment & Plan:     ICD-10-CM   1. Pleural effusion on left  J90 DG Chest 2 View  2. Malignant pleural effusion  J91.0   3. On continuous oral anticoagulation  Z79.01   4. Primary cancer of left lower lobe of lung (HCC)  C34.32   5. Non-small cell carcinoma of left lung, stage 4 (HCC)  C34.92     Discussion:  This is a 74-year-old gentleman recent diagnosis of stage IV non-small cell lung cancer with recurrent left-sided malignant pleural effusion.  Plan: Today in the office he complains of progressive shortness of breath and decision is made for therapeutic thoracentesis. Please see procedure note regarding thoracentesis. Today in the office we discussed the risk benefits and alternatives of proceeding with thoracentesis as well as the potential need for indwelling pleural catheter placement. Patient has held his Xarelto since Friday. He is agreeable to proceed with thoracentesis in the office And is agreeable to set up for planned indwelling pleural catheter placement which is to be done sometime next week. We will work with scheduling to consider it being done at [place] endoscopy procedure room.  Due to the fact that his fluid has reaccumulated so quickly I think that he will need drainage placed.   Current Outpatient Medications:  .  acetaminophen (TYLENOL 8 HOUR ARTHRITIS PAIN) 650 MG CR tablet, Take 1,300 mg by mouth as needed for pain (pain). , Disp: , Rfl:  .  albuterol (VENTOLIN HFA) 108 (90 Base) MCG/ACT inhaler, Inhale 2 puffs into the lungs every 4 hours as needed (Patient taking differently: Inhale 2 puffs into the lungs every 4 (four) hours as needed (allergies).), Disp: 6.7 each, Rfl: 0 .  ALPRAZolam (XANAX) 0.5 MG tablet, 1 tablet, Disp: , Rfl:  .  atorvastatin (LIPITOR) 40 MG tablet, Take 1 tablet (40 mg total) by mouth daily., Disp: 90 tablet, Rfl: 3 .  azelastine (ASTELIN) 0.1  % nasal spray, Place 2 sprays into both nostrils 2 (two) times daily., Disp: 30 mL, Rfl: 5 .  clindamycin (CLEOCIN) 300 MG capsule, Take 300 mg by mouth See admin instructions. Uses for dental procedures, Disp: , Rfl:  .  diclofenac Sodium (VOLTAREN) 1 % GEL, Apply 4 g topically 4 (four) times daily., Disp: 100 g, Rfl: 0 .  dicyclomine (BENTYL) 20 MG tablet, Take 20 mg by mouth as needed for spasms., Disp: , Rfl:  .  diltiazem (CARDIZEM) 30 MG tablet, Take 1 tablet (30 mg total) by mouth 4 (four) times daily as needed. (Patient taking differently: Take 30 mg by mouth 4 (four) times daily as needed (heart.).), Disp: 60 tablet, Rfl: 6 .  dofetilide (  TIKOSYN) 500 MCG capsule, Take 1 capsule (500 mcg total) by mouth 2 (two) times daily. Please keep upcoming appointment in February for future refills. Thank you, Disp: 180 capsule, Rfl: 3 .  ergocalciferol (VITAMIN D2) 1.25 MG (50000 UT) capsule, Take 1 capsule (50,000 Units total) by mouth once a week. (Patient taking differently: Take 50,000 Units by mouth once a week. mondays), Disp: 12 capsule, Rfl: 1 .  feeding supplement (ENSURE ENLIVE / ENSURE PLUS) LIQD, Take 237 mLs by mouth 2 (two) times daily between meals., Disp: 60 mL, Rfl: 0 .  fenofibrate 160 MG tablet, TAKE 1 TABLET DAILY. PLEASE KEEP UPCOMING APPT IN MARCH WITH DR. Acie Fredrickson BEFORE ANYMORE REFILLS. (Patient taking differently: Take 160 mg by mouth daily.), Disp: 90 tablet, Rfl: 3 .  fluticasone (FLONASE) 50 MCG/ACT nasal spray, Place 2 sprays into both nostrils daily., Disp: 18.2 mL, Rfl: 5 .  fluticasone (FLOVENT HFA) 110 MCG/ACT inhaler, Inhale 2 puffs into the lungs in the morning and at bedtime. with spacer and rinse mouth afterwards., Disp: 1 each, Rfl: 5 .  leuprolide, 6 Month, (ELIGARD) 45 MG injection, Inject 45 mg into the skin every 6 (six) months., Disp: , Rfl:  .  levocetirizine (XYZAL) 5 MG tablet, Take 5 mg by mouth every evening., Disp: , Rfl:  .  loperamide (IMODIUM) 2 MG  capsule, Take 2 mg by mouth as needed for diarrhea or loose stools., Disp: , Rfl:  .  magnesium oxide (MAG-OX) 400 MG tablet, TAKE 1 TABLET BY MOUTH EVERY DAY (Patient taking differently: Take 400 mg by mouth daily.), Disp: 90 tablet, Rfl: 2 .  metoprolol succinate (TOPROL XL) 25 MG 24 hr tablet, Take 1 tablet (25 mg total) by mouth daily., Disp: 90 tablet, Rfl: 3 .  Multiple Vitamins-Minerals (MULTIVITAMINS THER. W/MINERALS) TABS, Take 1 tablet by mouth daily., Disp: , Rfl:  .  nitroGLYCERIN (NITROSTAT) 0.4 MG SL tablet, Place 1 tablet (0.4 mg total) under the tongue every 5 (five) minutes as needed for chest pain., Disp: 25 tablet, Rfl: 5 .  omeprazole (PRILOSEC) 10 MG capsule, TAKE 1 CAPSULE DAILY. (Patient taking differently: Take 10 mg by mouth daily.), Disp: 90 capsule, Rfl: 3 .  oxybutynin (DITROPAN) 5 MG tablet, Take 1 tablet by mouth 2 (two) times daily as needed for bladder spasms., Disp: , Rfl:  .  potassium chloride (KLOR-CON) 10 MEQ tablet, Take 1 tablet (10 mEq total) by mouth daily., Disp: 90 tablet, Rfl: 2 .  Saccharomyces boulardii (PROBIOTIC) 250 MG CAPS, Take 1 capsule by mouth daily., Disp: , Rfl:  .  tamsulosin (FLOMAX) 0.4 MG CAPS capsule, Take 0.4 mg by mouth daily., Disp: , Rfl:  .  topiramate (TOPAMAX) 50 MG tablet, Take 2 tablets (100 mg total) by mouth 2 (two) times daily., Disp: 360 tablet, Rfl: 3 .  traMADol (ULTRAM) 50 MG tablet, Take 1 tablet (50 mg total) by mouth every 6 (six) hours as needed for moderate pain., Disp: 30 tablet, Rfl: 0 .  traZODone (DESYREL) 50 MG tablet, Take 0.5 tablets (25 mg total) by mouth at bedtime as needed for sleep., Disp: 45 tablet, Rfl: 3 .  XARELTO 20 MG TABS tablet, TAKE 1 TABLET BY MOUTH EVERY DAY (Patient taking differently: Take 20 mg by mouth daily with supper.), Disp: 90 tablet, Rfl: 2  I spent 42 minutes dedicated to the care of this patient on the date of this encounter to include pre-visit review of records, face-to-face time with  the patient discussing conditions  above, post visit ordering of testing, clinical documentation with the electronic health record, making appropriate referrals as documented, and communicating necessary findings to members of the patients care team.   Bradley L Icard, DO [facility] 03/09/2021 8:56 AM    

## 2021-03-09 NOTE — Telephone Encounter (Signed)
-----   Message from Garner Nash, DO sent at 03/09/2021 10:35 AM EDT ----- Juluis Rainier, orders placed for IPC on 14th in endo. I can do anytime that day that works for the patient in between bronchoscopy cases for that day.  Thanks [employer]

## 2021-03-09 NOTE — Progress Notes (Signed)
 Synopsis: Referred in June 2022 for malignant pleural effusion, PCP: By Koirala, Dibas, MD  Subjective:   PATIENT ID: Corey [name] GENDER: male DOB: 06/12/1946, MRN: [id number]  Chief Complaint  Patient presents with  . Follow-up    Thora procedure, increased shortness of breath    This is a 74-year-old gentleman, past medical history of coronary artery disease, coronary artery disease, gastroesophageal reflux, atrial fibrillation on Xarelto, OSA on CPAP.  Patient was recently admitted to the hospital on 02/24/2021.  Discharge summary reviewed from Dr. Mikael.  Patient was admitted for evaluation of dyspnea.  Found to have large left-sided pleural effusion with left-sided infrahilar mass.  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  Patient was taken for thoracentesis and bronchoscopy by Dr. Smith.  Pathology was consistent with non-small cell carcinoma.  Patient had subsequent follow-up medical oncology on 03/05/2021.  Met with Dr. Mohammed.  Office note reviewed.  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  Patient scheduled for follow-up with me today due to complaints of ongoing shortness of breath and dyspnea and discussion of outpatient thoracentesis.  And management of recurrent pleural effusion.   Past Medical History:  Diagnosis Date  . Anemia   . Anxiety   . Arthritis    "back, right knee, hands, ankles, neck" (05/31/2016)  . Carotid artery disease (HCC) 08/30/2016   Carotid US 11/19: R 1-39, L 40-59 // Carotid US 08/2019: R 1-39; L 40-59 // Carotid US 11/21: R 1-39; L 40-59>> repeat 1 year   . Chronic lower back pain   . Coronary atherosclerosis of native coronary artery    a. BMS to mRCA 2004 and 2007, otherwise mild nonobstructive disease. EF normal.  . Diverticulitis   . Dyslipidemia   .  Dyspnea   . Essential hypertension, benign   . GERD (gastroesophageal reflux disease)   . Lumbar radiculopathy, chronic 02/04/2015   Right L5  . Obesity   . OSA on CPAP    uses cpap  . Paroxysmal atrial fibrillation (HCC)    a. Discovered after stroke.  . Pneumonia 01/1996  . PONV (postoperative nausea and vomiting)   . Prostate CA (HCC)   . Recurrent upper respiratory infection (URI)   . Stroke (HCC) 07/2012   no deficits  . Visit for monitoring Tikosyn therapy 09/18/2019     Family History  Problem Relation Age of Onset  . Hypertension Mother   . Heart disease Father   . Heart attack Father   . Prostate cancer Brother   . Parkinson's disease Brother   . Blindness Son   . Breast cancer Neg Hx   . Colon cancer Neg Hx   . Pancreatic cancer Neg Hx      Past Surgical History:  Procedure Laterality Date  . ADENOIDECTOMY    . ANTERIOR CERVICAL DECOMP/DISCECTOMY FUSION  07/2001; 10/2002   "C5-6; C6-7; redo"  . BACK SURGERY    . CARPAL TUNNEL RELEASE Left 10/2015  . COLONOSCOPY W/ POLYPECTOMY  02/2014  . CORONARY ANGIOPLASTY WITH STENT PLACEMENT  05/2003; 12/2005   "mid RCA; mid RCA"  . FINE NEEDLE ASPIRATION  02/26/2021   Procedure: FINE NEEDLE ASPIRATION (FNA) LINEAR;  Surgeon: Smith, Daniel C, MD;  Location: [facility];  Service: Pulmonary;;  . HAND SURGERY  02/2019   LEFT HAND  . implantable   loop recorder placement  02/27/2020   Medtronic Reveal Linq model [id number]  implantable loop recorder implanted by Dr Allred for afib management and evaluation of presyncope  . JOINT REPLACEMENT    . KNEE ARTHROSCOPY Left 10/2005  . KNEE ARTHROSCOPY W/ PARTIAL MEDIAL MENISCECTOMY Left 09/2005  . LUMBAR LAMINECTOMY/DECOMPRESSION MICRODISCECTOMY  03/2005   "L4-5"  . POSTERIOR LUMBAR FUSION  10/2003   L5-S1; "plates, screws"  . SHOULDER ARTHROSCOPY Right 08/2011   Debridement of labrum, arthroscopic distal clavicle excision  . SHOULDER OPEN ROTATOR CUFF REPAIR Left 07/2014  .  TEE WITHOUT CARDIOVERSION  07/07/2012   Procedure: TRANSESOPHAGEAL ECHOCARDIOGRAM (TEE);  Surgeon: Paula V Ross, MD;  Location: [facility];  Service: Cardiovascular;  Laterality: N/A;  . THORACENTESIS N/A 02/25/2021   Procedure: THORACENTESIS;  Surgeon: McQuaid, Douglas B, MD;  Location: [facility];  Service: Cardiopulmonary;  Laterality: N/A;  . TONSILLECTOMY AND ADENOIDECTOMY  ~ 1956  . TOTAL KNEE ARTHROPLASTY Left 10/2006  . TRIGGER FINGER RELEASE Left 10/2015  . VIDEO BRONCHOSCOPY WITH ENDOBRONCHIAL ULTRASOUND Left 02/26/2021   Procedure: VIDEO BRONCHOSCOPY WITH ENDOBRONCHIAL ULTRASOUND;  Surgeon: Smith, Daniel C, MD;  Location: [facility];  Service: Pulmonary;  Laterality: Left;  cryoprobe too thanks!    Social History   Socioeconomic History  . Marital status: Married    Spouse name: Renee  . Number of children: 3  . Years of education: 14  . Highest education level: Not on file  Occupational History  . Occupation: Retired    Employer: DISABLED     Comment: Works at [employer] part time  Tobacco Use  . Smoking status: Former Smoker    Packs/day: 1.00    Years: 34.00    Pack years: 34.00    Types: Cigarettes    Quit date: 05/05/2003    Years since quitting: 17.8  . Smokeless tobacco: Never Used  Vaping Use  . Vaping Use: Never used  Substance and Sexual Activity  . Alcohol use: No    Alcohol/week: 0.0 standard drinks  . Drug use: No  . Sexual activity: Not Currently  Other Topics Concern  . Not on file  Social History Narrative   Patient lives at home with spouse.   Caffeine Use:    Social Determinants of Health   Financial Resource Strain: Not on file  Food Insecurity: Not on file  Transportation Needs: Not on file  Physical Activity: Not on file  Stress: Not on file  Social Connections: Not on file  Intimate Partner Violence: Not on file     Allergies  Allergen Reactions  . Amoxicillin Swelling  . Fosinopril Other (See Comments)  . Ampicillin Rash   . Monopril [Fosinopril Sodium] Other (See Comments)    Muscle aches & pain  . Rosuvastatin      Outpatient Medications Prior to Visit  Medication Sig Dispense Refill  . acetaminophen (TYLENOL 8 HOUR ARTHRITIS PAIN) 650 MG CR tablet Take 1,300 mg by mouth as needed for pain (pain).     . albuterol (VENTOLIN HFA) 108 (90 Base) MCG/ACT inhaler Inhale 2 puffs into the lungs every 4 hours as needed (Patient taking differently: Inhale 2 puffs into the lungs every 4 (four) hours as needed (allergies).) 6.7 each 0  . ALPRAZolam (XANAX) 0.5 MG tablet 1 tablet    . atorvastatin (LIPITOR) 40 MG tablet Take 1 tablet (40 mg total) by mouth daily. 90 tablet 3  . azelastine (ASTELIN) 0.1 % nasal spray Place 2 sprays into both nostrils   2 (two) times daily. 30 mL 5  . clindamycin (CLEOCIN) 300 MG capsule Take 300 mg by mouth See admin instructions. Uses for dental procedures    . diclofenac Sodium (VOLTAREN) 1 % GEL Apply 4 g topically 4 (four) times daily. 100 g 0  . dicyclomine (BENTYL) 20 MG tablet Take 20 mg by mouth as needed for spasms.    . diltiazem (CARDIZEM) 30 MG tablet Take 1 tablet (30 mg total) by mouth 4 (four) times daily as needed. (Patient taking differently: Take 30 mg by mouth 4 (four) times daily as needed (heart.).) 60 tablet 6  . dofetilide (TIKOSYN) 500 MCG capsule Take 1 capsule (500 mcg total) by mouth 2 (two) times daily. Please keep upcoming appointment in February for future refills. Thank you 180 capsule 3  . ergocalciferol (VITAMIN D2) 1.25 MG (50000 UT) capsule Take 1 capsule (50,000 Units total) by mouth once a week. (Patient taking differently: Take 50,000 Units by mouth once a week. mondays) 12 capsule 1  . feeding supplement (ENSURE ENLIVE / ENSURE PLUS) LIQD Take 237 mLs by mouth 2 (two) times daily between meals. 60 mL 0  . fenofibrate 160 MG tablet TAKE 1 TABLET DAILY. PLEASE KEEP UPCOMING APPT IN MARCH WITH DR. NAHSER BEFORE ANYMORE REFILLS. (Patient taking differently:  Take 160 mg by mouth daily.) 90 tablet 3  . fluticasone (FLONASE) 50 MCG/ACT nasal spray Place 2 sprays into both nostrils daily. 18.2 mL 5  . fluticasone (FLOVENT HFA) 110 MCG/ACT inhaler Inhale 2 puffs into the lungs in the morning and at bedtime. with spacer and rinse mouth afterwards. 1 each 5  . leuprolide, 6 Month, (ELIGARD) 45 MG injection Inject 45 mg into the skin every 6 (six) months.    . levocetirizine (XYZAL) 5 MG tablet Take 5 mg by mouth every evening.    . loperamide (IMODIUM) 2 MG capsule Take 2 mg by mouth as needed for diarrhea or loose stools.    . magnesium oxide (MAG-OX) 400 MG tablet TAKE 1 TABLET BY MOUTH EVERY DAY (Patient taking differently: Take 400 mg by mouth daily.) 90 tablet 2  . metoprolol succinate (TOPROL XL) 25 MG 24 hr tablet Take 1 tablet (25 mg total) by mouth daily. 90 tablet 3  . Multiple Vitamins-Minerals (MULTIVITAMINS THER. W/MINERALS) TABS Take 1 tablet by mouth daily.    . nitroGLYCERIN (NITROSTAT) 0.4 MG SL tablet Place 1 tablet (0.4 mg total) under the tongue every 5 (five) minutes as needed for chest pain. 25 tablet 5  . omeprazole (PRILOSEC) 10 MG capsule TAKE 1 CAPSULE DAILY. (Patient taking differently: Take 10 mg by mouth daily.) 90 capsule 3  . oxybutynin (DITROPAN) 5 MG tablet Take 1 tablet by mouth 2 (two) times daily as needed for bladder spasms.    . potassium chloride (KLOR-CON) 10 MEQ tablet Take 1 tablet (10 mEq total) by mouth daily. 90 tablet 2  . Saccharomyces boulardii (PROBIOTIC) 250 MG CAPS Take 1 capsule by mouth daily.    . tamsulosin (FLOMAX) 0.4 MG CAPS capsule Take 0.4 mg by mouth daily.    . topiramate (TOPAMAX) 50 MG tablet Take 2 tablets (100 mg total) by mouth 2 (two) times daily. 360 tablet 3  . traMADol (ULTRAM) 50 MG tablet Take 1 tablet (50 mg total) by mouth every 6 (six) hours as needed for moderate pain. 30 tablet 0  . traZODone (DESYREL) 50 MG tablet Take 0.5 tablets (25 mg total) by mouth at bedtime as needed for    sleep. 45 tablet 3  . XARELTO 20 MG TABS tablet TAKE 1 TABLET BY MOUTH EVERY DAY (Patient taking differently: Take 20 mg by mouth daily with supper.) 90 tablet 2   No facility-administered medications prior to visit.    Review of Systems  Constitutional: Negative for chills, fever, malaise/fatigue and weight loss.  HENT: Negative for hearing loss, sore throat and tinnitus.   Eyes: Negative for blurred vision and double vision.  Respiratory: Positive for shortness of breath. Negative for cough, hemoptysis, sputum production, wheezing and stridor.   Cardiovascular: Negative for chest pain, palpitations, orthopnea, leg swelling and PND.  Gastrointestinal: Negative for abdominal pain, constipation, diarrhea, heartburn, nausea and vomiting.  Genitourinary: Negative for dysuria, hematuria and urgency.  Musculoskeletal: Negative for joint pain and myalgias.  Skin: Negative for itching and rash.  Neurological: Negative for dizziness, tingling, weakness and headaches.  Endo/Heme/Allergies: Negative for environmental allergies. Does not bruise/bleed easily.  Psychiatric/Behavioral: Negative for depression. The patient is not nervous/anxious and does not have insomnia.   All other systems reviewed and are negative.    Objective:  Physical Exam Vitals reviewed.  Constitutional:      General: He is not in acute distress.    Appearance: He is well-developed.  HENT:     Head: Normocephalic and atraumatic.  Eyes:     General: No scleral icterus.    Conjunctiva/sclera: Conjunctivae normal.     Pupils: Pupils are equal, round, and reactive to light.  Neck:     Vascular: No JVD.     Trachea: No tracheal deviation.  Cardiovascular:     Rate and Rhythm: Normal rate and regular rhythm.     Heart sounds: Normal heart sounds. No murmur heard.   Pulmonary:     Effort: Pulmonary effort is normal. No tachypnea, accessory muscle usage or respiratory distress.     Breath sounds: No stridor. No  wheezing, rhonchi or rales.     Comments: Absent breath sounds left base, Abdominal:     General: Bowel sounds are normal. There is no distension.     Palpations: Abdomen is soft.     Tenderness: There is no abdominal tenderness.  Musculoskeletal:        General: No tenderness.     Cervical back: Neck supple.  Lymphadenopathy:     Cervical: No cervical adenopathy.  Skin:    General: Skin is warm and dry.     Capillary Refill: Capillary refill takes less than 2 seconds.     Findings: No rash.  Neurological:     Mental Status: He is alert and oriented to person, place, and time.  Psychiatric:        Behavior: Behavior normal.      Vitals:   03/09/21 0832  BP: 120/70  Pulse: 97  Temp: (!) 97.4 F (36.3 C)  TempSrc: Temporal  SpO2: 96%  Weight: 260 lb 3.2 oz (118 kg)  Height: 6' 1.5" (1.867 m)   96% on RA BMI Readings from Last 3 Encounters:  03/09/21 33.86 kg/m  03/05/21 34.41 kg/m  02/26/21 34.57 kg/m   Wt Readings from Last 3 Encounters:  03/09/21 260 lb 3.2 oz (118 kg)  03/05/21 260 lb 12.8 oz (118.3 kg)  02/26/21 262 lb (118.8 kg)     CBC    Component Value Date/Time   WBC 6.3 03/05/2021 1352   WBC 6.1 02/26/2021 0254   RBC 3.86 (L) 03/05/2021 1352   HGB 11.5 (L) 03/05/2021 1352   HGB 12.3 (L)   06/25/2020 1336   HCT 36.3 (L) 03/05/2021 1352   HCT 35.6 (L) 06/25/2020 1336   PLT 331 03/05/2021 1352   PLT 275 06/25/2020 1336   MCV 94.0 03/05/2021 1352   MCV 93 06/25/2020 1336   MCH 29.8 03/05/2021 1352   MCHC 31.7 03/05/2021 1352   RDW 15.9 (H) 03/05/2021 1352   RDW 14.9 06/25/2020 1336   LYMPHSABS 0.8 03/05/2021 1352   MONOABS 0.7 03/05/2021 1352   EOSABS 0.3 03/05/2021 1352   BASOSABS 0.1 03/05/2021 1352    Chest Imaging: Chest x-ray completed today 03/09/2021: Reduction in left-sided pleural effusion. No pneumothorax The patient's images have been independently reviewed by me.    Pulmonary Functions Testing Results: No flowsheet data  found.  FeNO:   Pathology:   Echocardiogram:   Heart Catheterization:     Assessment & Plan:     ICD-10-CM   1. Pleural effusion on left  J90 DG Chest 2 View  2. Malignant pleural effusion  J91.0   3. On continuous oral anticoagulation  Z79.01   4. Primary cancer of left lower lobe of lung (HCC)  C34.32   5. Non-small cell carcinoma of left lung, stage 4 (HCC)  C34.92     Discussion:  This is a 74-year-old gentleman recent diagnosis of stage IV non-small cell lung cancer with recurrent left-sided malignant pleural effusion.  Plan: Today in the office he complains of progressive shortness of breath and decision is made for therapeutic thoracentesis. Please see procedure note regarding thoracentesis. Today in the office we discussed the risk benefits and alternatives of proceeding with thoracentesis as well as the potential need for indwelling pleural catheter placement. Patient has held his Xarelto since Friday. He is agreeable to proceed with thoracentesis in the office And is agreeable to set up for planned indwelling pleural catheter placement which is to be done sometime next week. We will work with scheduling to consider it being done at  endoscopy procedure room.  Due to the fact that his fluid has reaccumulated so quickly I think that he will need drainage placed.   Current Outpatient Medications:  .  acetaminophen (TYLENOL 8 HOUR ARTHRITIS PAIN) 650 MG CR tablet, Take 1,300 mg by mouth as needed for pain (pain). , Disp: , Rfl:  .  albuterol (VENTOLIN HFA) 108 (90 Base) MCG/ACT inhaler, Inhale 2 puffs into the lungs every 4 hours as needed (Patient taking differently: Inhale 2 puffs into the lungs every 4 (four) hours as needed (allergies).), Disp: 6.7 each, Rfl: 0 .  ALPRAZolam (XANAX) 0.5 MG tablet, 1 tablet, Disp: , Rfl:  .  atorvastatin (LIPITOR) 40 MG tablet, Take 1 tablet (40 mg total) by mouth daily., Disp: 90 tablet, Rfl: 3 .  azelastine (ASTELIN) 0.1  % nasal spray, Place 2 sprays into both nostrils 2 (two) times daily., Disp: 30 mL, Rfl: 5 .  clindamycin (CLEOCIN) 300 MG capsule, Take 300 mg by mouth See admin instructions. Uses for dental procedures, Disp: , Rfl:  .  diclofenac Sodium (VOLTAREN) 1 % GEL, Apply 4 g topically 4 (four) times daily., Disp: 100 g, Rfl: 0 .  dicyclomine (BENTYL) 20 MG tablet, Take 20 mg by mouth as needed for spasms., Disp: , Rfl:  .  diltiazem (CARDIZEM) 30 MG tablet, Take 1 tablet (30 mg total) by mouth 4 (four) times daily as needed. (Patient taking differently: Take 30 mg by mouth 4 (four) times daily as needed (heart.).), Disp: 60 tablet, Rfl: 6 .  dofetilide (  TIKOSYN) 500 MCG capsule, Take 1 capsule (500 mcg total) by mouth 2 (two) times daily. Please keep upcoming appointment in February for future refills. Thank you, Disp: 180 capsule, Rfl: 3 .  ergocalciferol (VITAMIN D2) 1.25 MG (50000 UT) capsule, Take 1 capsule (50,000 Units total) by mouth once a week. (Patient taking differently: Take 50,000 Units by mouth once a week. mondays), Disp: 12 capsule, Rfl: 1 .  feeding supplement (ENSURE ENLIVE / ENSURE PLUS) LIQD, Take 237 mLs by mouth 2 (two) times daily between meals., Disp: 60 mL, Rfl: 0 .  fenofibrate 160 MG tablet, TAKE 1 TABLET DAILY. PLEASE KEEP UPCOMING APPT IN MARCH WITH DR. NAHSER BEFORE ANYMORE REFILLS. (Patient taking differently: Take 160 mg by mouth daily.), Disp: 90 tablet, Rfl: 3 .  fluticasone (FLONASE) 50 MCG/ACT nasal spray, Place 2 sprays into both nostrils daily., Disp: 18.2 mL, Rfl: 5 .  fluticasone (FLOVENT HFA) 110 MCG/ACT inhaler, Inhale 2 puffs into the lungs in the morning and at bedtime. with spacer and rinse mouth afterwards., Disp: 1 each, Rfl: 5 .  leuprolide, 6 Month, (ELIGARD) 45 MG injection, Inject 45 mg into the skin every 6 (six) months., Disp: , Rfl:  .  levocetirizine (XYZAL) 5 MG tablet, Take 5 mg by mouth every evening., Disp: , Rfl:  .  loperamide (IMODIUM) 2 MG  capsule, Take 2 mg by mouth as needed for diarrhea or loose stools., Disp: , Rfl:  .  magnesium oxide (MAG-OX) 400 MG tablet, TAKE 1 TABLET BY MOUTH EVERY DAY (Patient taking differently: Take 400 mg by mouth daily.), Disp: 90 tablet, Rfl: 2 .  metoprolol succinate (TOPROL XL) 25 MG 24 hr tablet, Take 1 tablet (25 mg total) by mouth daily., Disp: 90 tablet, Rfl: 3 .  Multiple Vitamins-Minerals (MULTIVITAMINS THER. W/MINERALS) TABS, Take 1 tablet by mouth daily., Disp: , Rfl:  .  nitroGLYCERIN (NITROSTAT) 0.4 MG SL tablet, Place 1 tablet (0.4 mg total) under the tongue every 5 (five) minutes as needed for chest pain., Disp: 25 tablet, Rfl: 5 .  omeprazole (PRILOSEC) 10 MG capsule, TAKE 1 CAPSULE DAILY. (Patient taking differently: Take 10 mg by mouth daily.), Disp: 90 capsule, Rfl: 3 .  oxybutynin (DITROPAN) 5 MG tablet, Take 1 tablet by mouth 2 (two) times daily as needed for bladder spasms., Disp: , Rfl:  .  potassium chloride (KLOR-CON) 10 MEQ tablet, Take 1 tablet (10 mEq total) by mouth daily., Disp: 90 tablet, Rfl: 2 .  Saccharomyces boulardii (PROBIOTIC) 250 MG CAPS, Take 1 capsule by mouth daily., Disp: , Rfl:  .  tamsulosin (FLOMAX) 0.4 MG CAPS capsule, Take 0.4 mg by mouth daily., Disp: , Rfl:  .  topiramate (TOPAMAX) 50 MG tablet, Take 2 tablets (100 mg total) by mouth 2 (two) times daily., Disp: 360 tablet, Rfl: 3 .  traMADol (ULTRAM) 50 MG tablet, Take 1 tablet (50 mg total) by mouth every 6 (six) hours as needed for moderate pain., Disp: 30 tablet, Rfl: 0 .  traZODone (DESYREL) 50 MG tablet, Take 0.5 tablets (25 mg total) by mouth at bedtime as needed for sleep., Disp: 45 tablet, Rfl: 3 .  XARELTO 20 MG TABS tablet, TAKE 1 TABLET BY MOUTH EVERY DAY (Patient taking differently: Take 20 mg by mouth daily with supper.), Disp: 90 tablet, Rfl: 2  I spent 42 minutes dedicated to the care of this patient on the date of this encounter to include pre-visit review of records, face-to-face time with  the patient discussing conditions   above, post visit ordering of testing, clinical documentation with the electronic health record, making appropriate referrals as documented, and communicating necessary findings to members of the patients care team.   Euleta Belson L Benny Deutschman, DO [facility] 03/09/2021 8:56 AM    

## 2021-03-09 NOTE — Telephone Encounter (Signed)
Completed. Nothing further needed at this time.

## 2021-03-09 NOTE — Telephone Encounter (Signed)
Pt scheduled for 6/14 at 2:00 at [facility].  Covid test 6/10 at 1:55.  Gave appt info to pt & made sure pt is aware to hold Xarelto 2 days prior.

## 2021-03-10 ENCOUNTER — Other Ambulatory Visit: Payer: Self-pay

## 2021-03-10 ENCOUNTER — Ambulatory Visit
Admission: RE | Admit: 2021-03-10 | Discharge: 2021-03-10 | Disposition: A | Discharge status: Home / Self Care | Payer: Medicare PPO | Source: Ambulatory Visit | Attending: Radiation Oncology | Admitting: Radiation Oncology

## 2021-03-10 ENCOUNTER — Telehealth: Payer: Self-pay | Admitting: *Deleted

## 2021-03-10 DIAGNOSIS — Z87891 Personal history of nicotine dependence: Secondary | ICD-10-CM | POA: Diagnosis not present

## 2021-03-10 DIAGNOSIS — Z510 Encounter for antineoplastic radiation therapy: Secondary | ICD-10-CM | POA: Diagnosis not present

## 2021-03-10 DIAGNOSIS — C3432 Malignant neoplasm of lower lobe, left bronchus or lung: Secondary | ICD-10-CM | POA: Diagnosis not present

## 2021-03-10 NOTE — Telephone Encounter (Signed)
I followed up on Corey [name] schedule and noted he starts radiation today.  His MRI brain are scheduled as well as PET.  PET is schedule out late so I called and the fist available PET appt is at his appt time.  I also followed up on molecular test results and they are still pending at this time. I called patient to update on the above information. I asked if he had any questions and not at this time.

## 2021-03-11 ENCOUNTER — Ambulatory Visit
Admission: RE | Admit: 2021-03-11 | Discharge: 2021-03-11 | Disposition: A | Discharge status: Home / Self Care | Payer: Medicare PPO | Source: Ambulatory Visit | Attending: Radiation Oncology | Admitting: Radiation Oncology

## 2021-03-11 ENCOUNTER — Other Ambulatory Visit: Payer: Self-pay

## 2021-03-11 DIAGNOSIS — Z87891 Personal history of nicotine dependence: Secondary | ICD-10-CM | POA: Diagnosis not present

## 2021-03-11 DIAGNOSIS — C3432 Malignant neoplasm of lower lobe, left bronchus or lung: Secondary | ICD-10-CM | POA: Diagnosis not present

## 2021-03-11 DIAGNOSIS — Z510 Encounter for antineoplastic radiation therapy: Secondary | ICD-10-CM | POA: Diagnosis not present

## 2021-03-12 ENCOUNTER — Encounter: Payer: Self-pay | Admitting: Medical Oncology

## 2021-03-12 ENCOUNTER — Ambulatory Visit
Admission: RE | Admit: 2021-03-12 | Discharge: 2021-03-12 | Disposition: A | Discharge status: Home / Self Care | Payer: Medicare PPO | Source: Ambulatory Visit | Attending: Radiation Oncology | Admitting: Radiation Oncology

## 2021-03-12 ENCOUNTER — Other Ambulatory Visit: Payer: Self-pay

## 2021-03-12 ENCOUNTER — Other Ambulatory Visit: Payer: Self-pay | Admitting: Radiation Oncology

## 2021-03-12 ENCOUNTER — Ambulatory Visit: Payer: Medicare PPO | Admitting: Pulmonary Disease

## 2021-03-12 VITALS — BP 116/71 | HR 70 | Temp 98.3°F

## 2021-03-12 DIAGNOSIS — C3492 Malignant neoplasm of unspecified part of left bronchus or lung: Secondary | ICD-10-CM

## 2021-03-12 DIAGNOSIS — C3432 Malignant neoplasm of lower lobe, left bronchus or lung: Secondary | ICD-10-CM

## 2021-03-12 DIAGNOSIS — Z87891 Personal history of nicotine dependence: Secondary | ICD-10-CM | POA: Diagnosis not present

## 2021-03-12 DIAGNOSIS — Z510 Encounter for antineoplastic radiation therapy: Secondary | ICD-10-CM | POA: Diagnosis not present

## 2021-03-12 LAB — CBC WITH DIFFERENTIAL (CANCER CENTER ONLY)
Abs Immature Granulocytes: 0.1 10*3/uL — ABNORMAL HIGH (ref 0.00–0.07)
Basophils Absolute: 0.1 10*3/uL (ref 0.0–0.1)
Basophils Relative: 1 %
Eosinophils Absolute: 0.3 10*3/uL (ref 0.0–0.5)
Eosinophils Relative: 4 %
HCT: 32.2 % — ABNORMAL LOW (ref 39.0–52.0)
Hemoglobin: 10.5 g/dL — ABNORMAL LOW (ref 13.0–17.0)
Immature Granulocytes: 1 %
Lymphocytes Relative: 9 %
Lymphs Abs: 0.7 10*3/uL (ref 0.7–4.0)
MCH: 29.5 pg (ref 26.0–34.0)
MCHC: 32.6 g/dL (ref 30.0–36.0)
MCV: 90.4 fL (ref 80.0–100.0)
Monocytes Absolute: 0.8 10*3/uL (ref 0.1–1.0)
Monocytes Relative: 11 %
Neutro Abs: 6 10*3/uL (ref 1.7–7.7)
Neutrophils Relative %: 74 %
Platelet Count: 347 10*3/uL (ref 150–400)
RBC: 3.56 MIL/uL — ABNORMAL LOW (ref 4.22–5.81)
RDW: 15.3 % (ref 11.5–15.5)
WBC Count: 8 10*3/uL (ref 4.0–10.5)
nRBC: 0 % (ref 0.0–0.2)

## 2021-03-12 LAB — CMP (CANCER CENTER ONLY)
ALT: 13 U/L (ref 0–44)
AST: 16 U/L (ref 15–41)
Albumin: 3 g/dL — ABNORMAL LOW (ref 3.5–5.0)
Alkaline Phosphatase: 53 U/L (ref 38–126)
Anion gap: 9 (ref 5–15)
BUN: 17 mg/dL (ref 8–23)
CO2: 24 mmol/L (ref 22–32)
Calcium: 9.3 mg/dL (ref 8.9–10.3)
Chloride: 99 mmol/L (ref 98–111)
Creatinine: 0.9 mg/dL (ref 0.61–1.24)
GFR, Estimated: >60 mL/min (ref >60)
Glucose, Bld: 118 mg/dL — ABNORMAL HIGH (ref 70–99)
Potassium: 4.3 mmol/L (ref 3.5–5.1)
Sodium: 132 mmol/L — ABNORMAL LOW (ref 135–145)
Total Bilirubin: 0.5 mg/dL (ref 0.3–1.2)
Total Protein: 6.8 g/dL (ref 6.5–8.1)

## 2021-03-12 MED ORDER — DEXTROSE-NACL 5-0.45 % IV SOLN
Freq: Once | INTRAVENOUS | Status: AC
  Filled 2021-03-12: qty 1000

## 2021-03-12 MED ORDER — PREDNISONE 5 MG PO TABS: 5.0000 mg | ORAL_TABLET | Freq: Every day | ORAL | 5 refills | Status: AC

## 2021-03-12 NOTE — Progress Notes (Signed)
Called Dr. Enis Gash office to cancel FU appointment/Eligard  for 6/10. Per Dr. Tammi Klippel, please reschedule patient for 8 weeks. He is currently being treated for lung cancer and not able to come.

## 2021-03-13 ENCOUNTER — Telehealth: Payer: Self-pay | Admitting: Physician Assistant

## 2021-03-13 ENCOUNTER — Ambulatory Visit
Admission: RE | Admit: 2021-03-13 | Discharge: 2021-03-13 | Disposition: A | Discharge status: Home / Self Care | Payer: Medicare PPO | Source: Ambulatory Visit | Attending: Radiation Oncology | Admitting: Radiation Oncology

## 2021-03-13 ENCOUNTER — Ambulatory Visit (HOSPITAL_COMMUNITY): Payer: Medicare PPO

## 2021-03-13 ENCOUNTER — Other Ambulatory Visit (HOSPITAL_COMMUNITY)
Admission: RE | Admit: 2021-03-13 | Discharge: 2021-03-13 | Disposition: A | Discharge status: Home / Self Care | Payer: Medicare PPO | Source: Ambulatory Visit | Attending: Pulmonary Disease | Admitting: Pulmonary Disease

## 2021-03-13 DIAGNOSIS — C3432 Malignant neoplasm of lower lobe, left bronchus or lung: Secondary | ICD-10-CM | POA: Diagnosis not present

## 2021-03-13 DIAGNOSIS — Z20822 Contact with and (suspected) exposure to covid-19: Secondary | ICD-10-CM | POA: Diagnosis not present

## 2021-03-13 DIAGNOSIS — Z01812 Encounter for preprocedural laboratory examination: Secondary | ICD-10-CM | POA: Insufficient documentation

## 2021-03-13 DIAGNOSIS — Z510 Encounter for antineoplastic radiation therapy: Secondary | ICD-10-CM | POA: Diagnosis not present

## 2021-03-13 DIAGNOSIS — Z87891 Personal history of nicotine dependence: Secondary | ICD-10-CM | POA: Diagnosis not present

## 2021-03-13 DIAGNOSIS — C3491 Malignant neoplasm of unspecified part of right bronchus or lung: Secondary | ICD-10-CM | POA: Diagnosis not present

## 2021-03-13 LAB — SARS CORONAVIRUS 2 (TAT 6-24 HRS): SARS Coronavirus 2: NEGATIVE

## 2021-03-13 NOTE — Telephone Encounter (Signed)
Scheduled per 06/02 los, patient has been called and notified of upcoming appointments.

## 2021-03-13 NOTE — Progress Notes (Signed)
D5 1/2 NS 1 liter done infusing. Patient tolerated this well. Patient reports feeling better. Removed right hand 22 gauge IV. Catheter intact upons removal. Applied a bandaid to old IV site. Patient tolerated this well. Explained patients lab results. Answered all patient questions to the best of my ability. Patient understands from Cira Rue, RN not to present for [employer]. Patient discharged home with his brother. Patient wheeled out of the clinic in stable condition by his brother.   Pre Treatment Orthostatics  Sitting   heart rate 93  bp 118/77 Standing  heart rate 101  bp 105/75  Post Treatment Vitals 98.3, 70, 116/71, 100%

## 2021-03-14 ENCOUNTER — Ambulatory Visit (HOSPITAL_COMMUNITY): Payer: Medicare PPO

## 2021-03-14 ENCOUNTER — Other Ambulatory Visit: Payer: Self-pay

## 2021-03-16 ENCOUNTER — Telehealth: Payer: Self-pay | Admitting: *Deleted

## 2021-03-16 ENCOUNTER — Telehealth: Payer: Self-pay | Admitting: Pulmonary Disease

## 2021-03-16 ENCOUNTER — Telehealth: Payer: Self-pay | Admitting: Radiation Oncology

## 2021-03-16 ENCOUNTER — Ambulatory Visit (HOSPITAL_COMMUNITY)
Admission: RE | Admit: 2021-03-16 | Discharge: 2021-03-16 | Disposition: A | Discharge status: Home / Self Care | Payer: Medicare PPO | Source: Ambulatory Visit | Attending: Physician Assistant | Admitting: Physician Assistant

## 2021-03-16 ENCOUNTER — Other Ambulatory Visit: Payer: Self-pay

## 2021-03-16 ENCOUNTER — Ambulatory Visit
Admission: RE | Admit: 2021-03-16 | Discharge: 2021-03-16 | Disposition: A | Discharge status: Home / Self Care | Payer: Medicare PPO | Source: Ambulatory Visit | Attending: Radiation Oncology | Admitting: Radiation Oncology

## 2021-03-16 VITALS — BP 110/60 | HR 97 | Ht 73.5 in | Wt 258.0 lb

## 2021-03-16 DIAGNOSIS — Z87891 Personal history of nicotine dependence: Secondary | ICD-10-CM | POA: Insufficient documentation

## 2021-03-16 DIAGNOSIS — Z7901 Long term (current) use of anticoagulants: Secondary | ICD-10-CM | POA: Insufficient documentation

## 2021-03-16 DIAGNOSIS — Z79899 Other long term (current) drug therapy: Secondary | ICD-10-CM | POA: Insufficient documentation

## 2021-03-16 DIAGNOSIS — Z8249 Family history of ischemic heart disease and other diseases of the circulatory system: Secondary | ICD-10-CM | POA: Insufficient documentation

## 2021-03-16 DIAGNOSIS — Z6833 Body mass index (BMI) 33.0-33.9, adult: Secondary | ICD-10-CM | POA: Insufficient documentation

## 2021-03-16 DIAGNOSIS — D6869 Other thrombophilia: Secondary | ICD-10-CM | POA: Diagnosis not present

## 2021-03-16 DIAGNOSIS — E785 Hyperlipidemia, unspecified: Secondary | ICD-10-CM | POA: Insufficient documentation

## 2021-03-16 DIAGNOSIS — I480 Paroxysmal atrial fibrillation: Secondary | ICD-10-CM | POA: Diagnosis not present

## 2021-03-16 DIAGNOSIS — C3490 Malignant neoplasm of unspecified part of unspecified bronchus or lung: Secondary | ICD-10-CM | POA: Insufficient documentation

## 2021-03-16 DIAGNOSIS — E669 Obesity, unspecified: Secondary | ICD-10-CM | POA: Insufficient documentation

## 2021-03-16 DIAGNOSIS — I2510 Atherosclerotic heart disease of native coronary artery without angina pectoris: Secondary | ICD-10-CM | POA: Insufficient documentation

## 2021-03-16 DIAGNOSIS — Z8673 Personal history of transient ischemic attack (TIA), and cerebral infarction without residual deficits: Secondary | ICD-10-CM | POA: Insufficient documentation

## 2021-03-16 DIAGNOSIS — Z510 Encounter for antineoplastic radiation therapy: Secondary | ICD-10-CM | POA: Diagnosis not present

## 2021-03-16 DIAGNOSIS — I10 Essential (primary) hypertension: Secondary | ICD-10-CM | POA: Insufficient documentation

## 2021-03-16 DIAGNOSIS — C3432 Malignant neoplasm of lower lobe, left bronchus or lung: Secondary | ICD-10-CM | POA: Diagnosis not present

## 2021-03-16 DIAGNOSIS — G4733 Obstructive sleep apnea (adult) (pediatric): Secondary | ICD-10-CM | POA: Insufficient documentation

## 2021-03-16 NOTE — Telephone Encounter (Signed)
Called patient but he did not answer. Left message for patient to call back.

## 2021-03-16 NOTE — Telephone Encounter (Signed)
Spoke with the pt and notified of response per TP  Pt verbalized understanding and states nothing further needed

## 2021-03-16 NOTE — Telephone Encounter (Signed)
Sorry to hear that he has not feeling well.  Please help him with information regarding his pleural drain from [facility] endoscopy  As far as his hemoglobin goes he does have multiple medical problems and with underlying lung cancer this will need to be followed closely but should not exclude him from getting his pleural drain.  His platelet counts were okay.

## 2021-03-16 NOTE — Telephone Encounter (Signed)
Pt called office to notify Dr. Valeta Harms that his hemoglobin (10.5)and RBC (3.56) count was low and he was having increased SOB and weakness. Pt is scheduled for a INSERTION PLEURAL DRAINAGE CATHETER tomorrow and just wanted Dr. Valeta Harms to be aware. Pt also states being unsure of where to go and when to be at [facility] tomorrow. Pt was given phone to [facility] so they may advise him on details of tomorrow's appointment.  Tammy can you please advise to pt's symptoms as Dr. Valeta Harms is not in office nor at hospital today. Thank you.

## 2021-03-16 NOTE — Telephone Encounter (Signed)
I received a message from Corey [name].  I called him back but was unable to reach.  I did leave vm message with my name an phone number to call.

## 2021-03-16 NOTE — Progress Notes (Signed)
Primary Care Physician: [name], MD Primary Cardiologist: Dr Acie Fredrickson Primary Electrophysiologist: Dr Rayann Heman Referring Physician: Dr Deatra Canter is a 75 y.o. male with a history of CAD, HTN, OSA, CVA, paroxysmal atrial fibrillation, and HLD who presents for follow up in the [facility].  The patient was initially diagnosed with atrial fibrillation in 2016 after presenting with symptoms of presyncope. He converted on IV diltiazem and has been maintained on Xarelto. Patient had done well for years until 07/06/19 when he presented to the ER with symptoms of palpitations and chest pressure and found to be in afib vs atrial flutter with RVR. He converted while being evaluated in the ER and his symptoms resolved. Since then, he has not had any further symptoms. He denies any specific triggers that he could identify. He has a diagnosis of OSA and is compliant with his CPAP. He denies significant alcohol use. He is on Xarelto for a CHADS2VASC score of 5. Patient had another episode of afib on 09/07/19. There were no specific triggers that the patient could identify. He took the PRN diltiazem without much relief. He presented to the ER in afib with RVR and underwent successful DCCV. Patient is s/p dofetilide loading 12/15-12/18/20. Patient had recurrent episodes of blurred vision and "foggy headedness". Neurologic workup was unremarkable. He underwent ILR implantation with Dr Rayann Heman 02/27/20.  On follow up today, patient presented to the ED 02/24/21 with increasing SOB. Imaging showed a left pleural effusion with a lung mass questionable for malignancy. 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 He is currently off anticoagulation in preparation for thoracentesis and drain placement. He denies any heart racing or palpitations.   Today, he denies symptoms of palpitations, chest pain, orthopnea, PND, lower extremity edema, presyncope, syncope,  bleeding, or neurologic sequela. The patient is tolerating medications without difficulties and is otherwise without complaint today.    Atrial Fibrillation Risk Factors:  he does have symptoms or diagnosis of sleep apnea. he is compliant with CPAP therapy. he does not have a history of rheumatic fever. he does not have a history of alcohol use. The patient does not have a history of early familial atrial fibrillation or other arrhythmias.  he has a BMI of Body mass index is 33.58 kg/m.Marland Kitchen Filed Weights   03/16/21 1027  Weight: 117 kg     Family History  Problem Relation Age of Onset   Hypertension Mother    Heart disease Father    Heart attack Father    Prostate cancer Brother    Parkinson's disease Brother    Blindness Son    Breast cancer Neg Hx    Colon cancer Neg Hx    Pancreatic cancer Neg Hx      Atrial Fibrillation Management history:  Previous antiarrhythmic drugs: dofetilide Previous cardioversions: 09/07/19 Previous ablations: none CHADS2VASC score: 5 Anticoagulation history: Xarelto    Past Medical History:  Diagnosis Date   Anemia    Anxiety    Arthritis    "back, right knee, hands, ankles, neck" (05/31/2016)   Carotid artery disease ([place]) 08/30/2016   Carotid US 11/19: R 1-39, L 40-59 // Carotid US 08/2019: R 1-39; L 40-59 // Carotid US 11/21: R 1-39; L 40-59>> repeat 1 year    Chronic lower back pain    Coronary atherosclerosis of native coronary artery    a. BMS to [facility] 2004 and 2007, otherwise mild nonobstructive disease. EF normal.  Diverticulitis    Dyslipidemia    Dyspnea    Essential hypertension, benign    GERD (gastroesophageal reflux disease)    Lumbar radiculopathy, chronic 02/04/2015   Right L5   Obesity    OSA on CPAP    uses cpap   Paroxysmal atrial fibrillation ([place])    a. Discovered after stroke.   Pneumonia 01/1996   PONV (postoperative nausea and vomiting)    Prostate CA ([place])    Recurrent upper respiratory infection (URI)     Stroke ([place]) 07/2012   no deficits   Visit for monitoring Tikosyn therapy 09/18/2019   Past Surgical History:  Procedure Laterality Date   ADENOIDECTOMY     ANTERIOR CERVICAL DECOMP/DISCECTOMY FUSION  07/2001; 10/2002   "C5-6; C6-7; redo"   BACK SURGERY     CARPAL TUNNEL RELEASE Left 10/2015   COLONOSCOPY W/ POLYPECTOMY  02/2014   CORONARY ANGIOPLASTY WITH STENT PLACEMENT  05/2003; 12/2005   "mid RCA; mid RCA"   FINE NEEDLE ASPIRATION  02/26/2021   Procedure: FINE NEEDLE ASPIRATION (FNA) LINEAR;  Surgeon: Candee Furbish, MD;  Location: [facility] ENDOSCOPY;  Service: Pulmonary;;   HAND SURGERY  02/2019   LEFT HAND   implantable loop recorder placement  02/27/2020   [employer] Reveal [place] model [id number]  implantable loop recorder implanted by Dr Rayann Heman for afib management and evaluation of presyncope   JOINT REPLACEMENT     KNEE ARTHROSCOPY Left 10/2005   KNEE ARTHROSCOPY W/ PARTIAL MEDIAL MENISCECTOMY Left 09/2005   LUMBAR LAMINECTOMY/DECOMPRESSION MICRODISCECTOMY  03/2005   "L4-5"   POSTERIOR LUMBAR FUSION  10/2003   L5-S1; "plates, screws"   SHOULDER ARTHROSCOPY Right 08/2011   Debridement of labrum, arthroscopic distal clavicle excision   SHOULDER OPEN ROTATOR CUFF REPAIR Left 07/2014   TEE WITHOUT CARDIOVERSION  07/07/2012   Procedure: TRANSESOPHAGEAL ECHOCARDIOGRAM (TEE);  Surgeon: Fay Records, MD;  Location[facility] ENDOSCOPY;  Service: Cardiovascular;  Laterality: N/A;   THORACENTESIS N/A 02/25/2021   Procedure: Mathews Robinsons;  Surgeon: Juanito Doom, MD;  Location: [place];  Service: Cardiopulmonary;  Laterality: N/A;   TONSILLECTOMY AND ADENOIDECTOMY  ~ 1956   TOTAL KNEE ARTHROPLASTY Left 10/2006   TRIGGER FINGER RELEASE Left 10/2015   VIDEO BRONCHOSCOPY WITH ENDOBRONCHIAL ULTRASOUND Left 02/26/2021   Procedure: VIDEO BRONCHOSCOPY WITH ENDOBRONCHIAL ULTRASOUND;  Surgeon: Candee Furbish, MD;  Location: [facility] ENDOSCOPY;  Service: Pulmonary;  Laterality: Left;  cryoprobe too  thanks!    Current Outpatient Medications  Medication Sig Dispense Refill   acetaminophen (TYLENOL 8 HOUR ARTHRITIS PAIN) 650 MG CR tablet Take 1,300 mg by mouth as needed for pain (pain).      albuterol (VENTOLIN HFA) 108 (90 Base) MCG/ACT inhaler Inhale 2 puffs into the lungs every 4 hours as needed (Patient taking differently: Inhale 2 puffs into the lungs every 4 (four) hours as needed (allergies).) 6.7 each 0   atorvastatin (LIPITOR) 40 MG tablet Take 1 tablet (40 mg total) by mouth daily. 90 tablet 3   Azelastine HCl 137 MCG/SPRAY SOLN PLACE 2 SPRAYS INTO BOTH NOSTRILS 2 (TWO) TIMES DAILY 30 mL 0   clindamycin (CLEOCIN) 300 MG capsule Take 300 mg by mouth See admin instructions. Uses for dental procedures     diclofenac Sodium (VOLTAREN) 1 % GEL Apply 4 g topically 4 (four) times daily. 100 g 0   dicyclomine (BENTYL) 20 MG tablet Take 20 mg by mouth as needed for spasms.     diltiazem (CARDIZEM) 30 MG tablet  Take 1 tablet (30 mg total) by mouth 4 (four) times daily as needed. (Patient taking differently: Take 30 mg by mouth 4 (four) times daily as needed (heart.).) 60 tablet 6   dofetilide (TIKOSYN) 500 MCG capsule Take 1 capsule (500 mcg total) by mouth 2 (two) times daily. Please keep upcoming appointment in February for future refills. Thank you 180 capsule 3   ergocalciferol (VITAMIN D2) 1.25 MG (50000 UT) capsule Take 1 capsule (50,000 Units total) by mouth once a week. (Patient taking differently: Take 50,000 Units by mouth once a week. mondays) 12 capsule 1   feeding supplement (ENSURE ENLIVE / ENSURE PLUS) LIQD Take 237 mLs by mouth 2 (two) times daily between meals. 60 mL 0   fenofibrate 160 MG tablet TAKE 1 TABLET DAILY. PLEASE KEEP UPCOMING APPT IN MARCH WITH DR. Acie Fredrickson BEFORE ANYMORE REFILLS. (Patient taking differently: Take 160 mg by mouth daily.) 90 tablet 3   fluticasone (FLONASE) 50 MCG/ACT nasal spray Place 2 sprays into both nostrils daily. 18.2 mL 5   fluticasone (FLOVENT  HFA) 110 MCG/ACT inhaler Inhale 2 puffs into the lungs in the morning and at bedtime. with spacer and rinse mouth afterwards. 1 each 5   levocetirizine (XYZAL) 5 MG tablet Take 5 mg by mouth every evening.     loperamide (IMODIUM) 2 MG capsule Take 2 mg by mouth as needed for diarrhea or loose stools.     LORazepam (ATIVAN) 0.5 MG tablet One tab po 30 min before MRI. Repeat once if needed. 2 tablet 0   magnesium oxide (MAG-OX) 400 MG tablet TAKE 1 TABLET BY MOUTH EVERY DAY (Patient taking differently: Take 400 mg by mouth daily.) 90 tablet 2   metoprolol succinate (TOPROL XL) 25 MG 24 hr tablet Take 1 tablet (25 mg total) by mouth daily. 90 tablet 3   Multiple Vitamins-Minerals (MULTIVITAMINS THER. W/MINERALS) TABS Take 1 tablet by mouth daily.     nitroGLYCERIN (NITROSTAT) 0.4 MG SL tablet Place 1 tablet (0.4 mg total) under the tongue every 5 (five) minutes as needed for chest pain. 25 tablet 5   omeprazole (PRILOSEC) 10 MG capsule TAKE 1 CAPSULE DAILY. (Patient taking differently: Take 10 mg by mouth daily.) 90 capsule 3   oxybutynin (DITROPAN) 5 MG tablet Take 1 tablet by mouth 2 (two) times daily as needed for bladder spasms.     potassium chloride (KLOR-CON) 10 MEQ tablet Take 1 tablet (10 mEq total) by mouth daily. 90 tablet 2   predniSONE (DELTASONE) 5 MG tablet Take 1 tablet (5 mg total) by mouth daily with breakfast for 15 days. 15 tablet 5   Saccharomyces boulardii (PROBIOTIC) 250 MG CAPS Take 1 capsule by mouth daily.     tamsulosin (FLOMAX) 0.4 MG CAPS capsule Take 0.4 mg by mouth daily.     topiramate (TOPAMAX) 50 MG tablet Take 2 tablets (100 mg total) by mouth 2 (two) times daily. (Patient taking differently: Take 100 mg by mouth 2 (two) times daily. Taking 50mg  by mouth in the am and 2 tablets by mouth in the evening) 360 tablet 3   traMADol (ULTRAM) 50 MG tablet Take 1 tablet (50 mg total) by mouth every 6 (six) hours as needed for moderate pain. 30 tablet 0   traZODone (DESYREL) 50  MG tablet Take 0.5 tablets (25 mg total) by mouth at bedtime as needed for sleep. 45 tablet 3   XARELTO 20 MG TABS tablet TAKE 1 TABLET BY MOUTH EVERY DAY (Patient taking differently:  Take 20 mg by mouth daily with supper.) 90 tablet 2   leuprolide, 6 Month, (ELIGARD) 45 MG injection Inject 45 mg into the skin every 6 (six) months. (Patient not taking: Reported on 03/16/2021)     No current facility-administered medications for this encounter.    Allergies  Allergen Reactions   Amoxicillin Swelling   Fosinopril Other (See Comments)   Hydromorphone Nausea Only, Other (See Comments) and Nausea And Vomiting   Ampicillin Rash   Monopril [Fosinopril Sodium] Other (See Comments)    Muscle aches & pain   Other Other (See Comments)   Rosuvastatin     Social History   Socioeconomic History   Marital status: Married    Spouse name: Renee   Number of children: 3   Years of education: 14   Highest education level: Not on file  Occupational History   Occupation: Retired    Fish farm manager: DISABLED     Comment: Works at [employer] part time  Tobacco Use   Smoking status: Former    Packs/day: 1.00    Years: 34.00    Pack years: 34.00    Types: Cigarettes    Quit date: 05/05/2003    Years since quitting: 17.8   Smokeless tobacco: Never  Vaping Use   Vaping Use: Never used  Substance and Sexual Activity   Alcohol use: No    Alcohol/week: 0.0 standard drinks   Drug use: No   Sexual activity: Not Currently  Other Topics Concern   Not on file  Social History Narrative   Patient lives at home with spouse.   Caffeine Use:    Social Determinants of Health   Financial Resource Strain: Not on file  Food Insecurity: Not on file  Transportation Needs: Not on file  Physical Activity: Not on file  Stress: Not on file  Social Connections: Not on file  Intimate Partner Violence: Not on file     ROS- All systems are reviewed and negative except as per the HPI above.  Physical  Exam: Vitals:   03/16/21 1027  Weight: 117 kg  Height: 6' 1.5" (1.867 m)     GEN- The patient is a well appearing obese male, alert and oriented x 3 today.   HEENT-head normocephalic, atraumatic, sclera clear, conjunctiva pink, hearing intact, trachea midline. Lungs- decreased breath sounds L side, normal work of breathing Heart- Regular rate and rhythm, no murmurs, rubs or gallops  GI- soft, NT, ND, + BS Extremities- no clubbing, cyanosis, or edema MS- no significant deformity or atrophy Skin- no rash or lesion Psych- euthymic mood, full affect Neuro- strength and sensation are intact   Wt Readings from Last 3 Encounters:  03/16/21 117 kg  03/09/21 118 kg  03/05/21 118.3 kg    EKG today demonstrates  SR Vent. rate 97 BPM PR interval 168 ms QRS duration 90 ms QT/QTcB 388/492 ms (458 ms measured manually)  Echo 07/19/19 demonstrated   1. Left ventricular ejection fraction, by visual estimation, is 60 to 65%. The left ventricle has normal function. Normal left ventricular size. There is mildly increased left ventricular hypertrophy.  2. Definity contrast agent was given IV to delineate the left ventricular endocardial borders.  3. Global right ventricle has normal systolic function.The right ventricular size is normal. No increase in right ventricular wall thickness.  4. Left atrial size was mildly dilated.  5. Right atrial size was normal.  6. The mitral valve is normal in structure. Trace mitral valve regurgitation. No evidence of mitral  stenosis.  7. The tricuspid valve is normal in structure. Tricuspid valve regurgitation was not visualized by color flow Doppler.  8. The aortic valve is tricuspid Aortic valve regurgitation was not visualized by color flow Doppler. Mild aortic valve sclerosis without stenosis.  9. The pulmonic valve was normal in structure. Pulmonic valve regurgitation is not visualized by color flow Doppler. 10. The inferior vena cava is normal in size  with greater than 50% respiratory variability, suggesting right atrial pressure of 3 mmHg.  Epic records are reviewed at length today  Assessment and Plan:  1. Paroxysmal atrial fibrillation S/p dofetilide loading 12/15-12/18/20. ILR shows 0% afib burden. Continue dofetilide 500 mcg BID. QT stable when measured manually. Recent bmet/mag reviewed. Continue Xarelto 20 mg daily.  Continue Toprol 50 mg daily Continue diltiazem 30 mg PRN q4hrs for heart racing.  This patients CHA2DS2-VASc Score and unadjusted Ischemic Stroke Rate (% per year) is equal to 7.2 % stroke rate/year from a score of 5  Above score calculated as 1 point each if present [CHF, HTN, DM, Vascular=MI/PAD/Aortic Plaque, Age if 65-74, or Male] Above score calculated as 2 points each if present [Age > 75, or Stroke/TIA/TE]  2. Obesity Body mass index is 33.58 kg/m. Lifestyle modification was discussed and encouraged including regular physical activity and weight reduction.  3. Obstructive sleep apnea Patient reports compliance with CPAP therapy.  4. CAD Normal stress myoveiw 10/17/19 No anginal symptoms.   5. HTN Stable, no changes today.  6. Stage IV lung cancer Plans per oncology.   Follow up in the AF clinic in 6 months.    [facility] [address] [place], [place] [address] [phone] 03/16/2021 10:55 AM

## 2021-03-16 NOTE — Telephone Encounter (Signed)
Received voicemail message from patient requesting return call. Phoned patient back to inquire. Patient reports continued weakness and light headedness. Explained these symptoms are to be expected until his thoracentesis. Patient reports shortness of breath but denies dyspnea. Advised to call 911 should he begin to struggle breathing. Advised him to reserve his energy for thing he enjoys. Patient expresses concern that his hemoglobin dropped from 11 to 10. Advised this is to be expected with his current situation and transfusion isn't necessary at this point nor should his thoracentesis be delayed. Patient questions if he should take radiation the same day he is having a thoracentesis. Advised there is no contraindication. Encouraged he complete radiation prior to his thoracentesis tomorrow. Patient request therapist (L3) contact him with an earlier treatment time. Sent L3 therapist an email requesting they contact the patient after 1130 today with an earlier treatment time. Answered all patient questions to the best of my ability. Patient verbalized understanding of all reviewed.

## 2021-03-17 ENCOUNTER — Encounter: Payer: Self-pay | Admitting: *Deleted

## 2021-03-17 ENCOUNTER — Encounter (HOSPITAL_COMMUNITY)
Admission: RE | Disposition: A | Discharge status: Home / Self Care | Payer: Self-pay | Source: Home / Self Care | Attending: Pulmonary Disease

## 2021-03-17 ENCOUNTER — Encounter (HOSPITAL_COMMUNITY): Payer: Self-pay | Admitting: Pulmonary Disease

## 2021-03-17 ENCOUNTER — Ambulatory Visit (HOSPITAL_COMMUNITY): Payer: Medicare PPO

## 2021-03-17 ENCOUNTER — Ambulatory Visit
Admission: RE | Admit: 2021-03-17 | Discharge: 2021-03-17 | Disposition: A | Discharge status: Home / Self Care | Payer: Medicare PPO | Source: Ambulatory Visit | Attending: Radiation Oncology | Admitting: Radiation Oncology

## 2021-03-17 ENCOUNTER — Encounter: Payer: Self-pay | Admitting: Internal Medicine

## 2021-03-17 ENCOUNTER — Ambulatory Visit (HOSPITAL_COMMUNITY)
Admission: RE | Admit: 2021-03-17 | Discharge: 2021-03-17 | Disposition: A | Discharge status: Home / Self Care | Payer: Medicare PPO | Attending: Pulmonary Disease | Admitting: Pulmonary Disease

## 2021-03-17 DIAGNOSIS — J910 Malignant pleural effusion: Secondary | ICD-10-CM | POA: Diagnosis not present

## 2021-03-17 DIAGNOSIS — I2510 Atherosclerotic heart disease of native coronary artery without angina pectoris: Secondary | ICD-10-CM | POA: Insufficient documentation

## 2021-03-17 DIAGNOSIS — Z9689 Presence of other specified functional implants: Secondary | ICD-10-CM | POA: Diagnosis not present

## 2021-03-17 DIAGNOSIS — Z510 Encounter for antineoplastic radiation therapy: Secondary | ICD-10-CM | POA: Diagnosis not present

## 2021-03-17 DIAGNOSIS — I480 Paroxysmal atrial fibrillation: Secondary | ICD-10-CM | POA: Insufficient documentation

## 2021-03-17 DIAGNOSIS — Z7901 Long term (current) use of anticoagulants: Secondary | ICD-10-CM | POA: Insufficient documentation

## 2021-03-17 DIAGNOSIS — Z87891 Personal history of nicotine dependence: Secondary | ICD-10-CM | POA: Diagnosis not present

## 2021-03-17 DIAGNOSIS — C61 Malignant neoplasm of prostate: Secondary | ICD-10-CM | POA: Diagnosis not present

## 2021-03-17 DIAGNOSIS — J90 Pleural effusion, not elsewhere classified: Secondary | ICD-10-CM | POA: Diagnosis not present

## 2021-03-17 DIAGNOSIS — I10 Essential (primary) hypertension: Secondary | ICD-10-CM | POA: Insufficient documentation

## 2021-03-17 DIAGNOSIS — Z7951 Long term (current) use of inhaled steroids: Secondary | ICD-10-CM | POA: Insufficient documentation

## 2021-03-17 DIAGNOSIS — Z79899 Other long term (current) drug therapy: Secondary | ICD-10-CM | POA: Insufficient documentation

## 2021-03-17 DIAGNOSIS — C801 Malignant (primary) neoplasm, unspecified: Secondary | ICD-10-CM | POA: Diagnosis not present

## 2021-03-17 DIAGNOSIS — C3432 Malignant neoplasm of lower lobe, left bronchus or lung: Secondary | ICD-10-CM | POA: Insufficient documentation

## 2021-03-17 DIAGNOSIS — R9431 Abnormal electrocardiogram [ECG] [EKG]: Secondary | ICD-10-CM | POA: Diagnosis not present

## 2021-03-17 DIAGNOSIS — E785 Hyperlipidemia, unspecified: Secondary | ICD-10-CM | POA: Diagnosis not present

## 2021-03-17 HISTORY — PX: CHEST TUBE INSERTION: SHX231

## 2021-03-17 LAB — GUARDANT 360

## 2021-03-17 SURGERY — INSERTION, PLEURAL DRAINAGE CATHETER
Anesthesia: Topical | Laterality: Left

## 2021-03-17 NOTE — Op Note (Signed)
Left Sided PleurX Insertion Procedure Note  LADAINIAN THERIEN  [id number]  1946-04-22  Date:03/17/21  Time:2:56 PM   Provider Performing:Eileene Kisling L Miriam Liles  Procedure: PleurX Tunneled Pleural Catheter Placement (09326)  Indication(s) Relief of dyspnea from recurrent effusion  Consent Risks of the procedure as well as the alternatives and risks of each were explained to the patient and/or caregiver.  Consent for the procedure was obtained.  Anesthesia Topical only with 1% lidocaine   Time Out Verified patient identification, verified procedure, site/side was marked, verified correct patient position, special equipment/implants available, medications/allergies/relevant history reviewed, required imaging and test results available.  Sterile Technique Maximal sterile technique including sterile barrier drape, hand hygiene, sterile gown, sterile gloves, mask, hair covering.  Procedure Description Ultrasound used to identify appropriate pleural anatomy for placement and overlying skin marked.  Area of drainage cleaned and draped in sterile fashion.   Lidocaine was used to anesthetize the skin and subcutaneous tissue.   1.5 cm incision made overlying fluid and another about 5 cm anterior to this along chest wall.  PleurX catheter inserted in usual sterile fashion using modified seldinger technique.  Interrupted silk sutures placed at catheter insertion and tunneling points which will be removed at later date.  PleurX catheter then hooked to suction.  After fluid aspirated, pleurX capped and sterile dressing applied.  Complications/Tolerance None; patient tolerated the procedure well. Chest X-ray is ordered to confirm no post-procedural complication.  EBL Minimal  Specimen(s) Serosanginous fluid 2100cc drained  Garner Nash, DO [facility] 03/17/2021 2:57 PM

## 2021-03-17 NOTE — Interval H&P Note (Signed)
History and Physical Interval Note:  03/17/2021 2:23 PM  Corey [name]  has presented today for surgery, with the diagnosis of malignant effusion.  The various methods of treatment have been discussed with the patient and family. After consideration of risks, benefits and other options for treatment, the patient has consented to  Procedure(s) with comments: INSERTION PLEURAL DRAINAGE CATHETER (Left) - Indwelling Tunneled Pleural catheter (PLEUREX)  as a surgical intervention.  The patient's history has been reviewed, patient examined, no change in status, stable for surgery.  I have reviewed the patient's chart and labs.  Questions were answered to the patient's satisfaction.     [place]

## 2021-03-17 NOTE — Progress Notes (Signed)
I followed up on patient's molecular test results. Will place them on Cassie's PA-C desk. She sees him tomorrow.

## 2021-03-17 NOTE — Progress Notes (Signed)
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  Pending further staging work-up.   Molecular Studies by Guardant 360: [id number] 0.5%  Olaparib Yes [id number] 50.9%  Olaparib Yes [id number] 0.8% None    PRIOR THERAPY: None  CURRENT THERAPY: 1) Palliative radiotherapy to the obstructive lung mass in the left lung under the care of Dr. MTammi Klippel Last treatment 03/23/21 2) palliative systemic chemotherapy with carboplatin AUC 5, Alimta 500 mg per metered squared, Keytruda 200 mg IV every 3 weeks.  First dose expected on 03/30/21  INTERVAL HISTORY: [name]75y.o. male returns to the clinic today for a follow-up visit accompanied by his wife.  The patient is feeling fairly well today without any concerning complaints. The patient recently had a Pleurx catheter placed on 03/17/2021 under the care of Dr. IValeta Harmsfor his malignant pleural effusions. The patient states they removed 2 L of fluid. He noted some improvement in his breathing. The patient is also receiving palliative radiation to the obstructive lung mass under the care of Dr. MTammi Klippel  His last treatment is expected on 03/23/21.  The patient was last seen in the clinic on 03/05/2021.  At that time, Dr. MJulien Nordmannrecommended the patient complete the staging work-up with a PET scan and a brain MRI.  The patient's PET scan and brain MRI have not been performed yet and are scheduled for 03/31/21 and 03/19/21 respectively.  The patient also had molecular studies by guardant 360 performed to see if he has any actionable mutations.  Unfortunately, the patient does not have any actionable mutations.  Overall, the patient is feeling well except for some  dyspnea on exertion and some cough.  He denies any hemoptysis or chest pain.  He denies any fevers, chills, or night sweats.  He denies any weight loss.  He denies any nausea, vomiting, or diarrhea. He has mild baseline constipation for which he uses miralax. He has baseline anemia and denies any bleeding or bruising except the pleural fluid is reportedly has blood in it. The patient is here today for evaluation and for more detailed discussion by his current condition and recommended treatment options.  MEDICAL HISTORY: Past Medical History:  Diagnosis Date   Anemia    Anxiety    Arthritis    "back, right knee, hands, ankles, neck" (05/31/2016)   Carotid artery disease ([address] 08/30/2016   Carotid [place]: R 1-39, L 40-59 // Carotid [place]: R 1-39; L 40-59 // Carotid [place]21: R 1-39; L 40-59>> repeat 1 year    Chronic lower back pain    Coronary atherosclerosis of native coronary artery    a. [facility] and [facility], otherwise mild nonobstructive disease. EF normal.   Diverticulitis    Dyslipidemia    Dyspnea    Essential hypertension, benign    GERD (gastroesophageal reflux disease)    Lumbar radiculopathy, chronic 02/04/2015   Right L5   Obesity    OSA on CPAP    uses cpap   Paroxysmal atrial fibrillation ([facility]    a. Discovered after stroke.   Pneumonia 01/1996   PONV (postoperative nausea and vomiting)    Prostate CA (HCC)    Recurrent upper respiratory infection (URI)  Stroke [facility]) 07/2012   no deficits   Visit for monitoring Tikosyn therapy 09/18/2019    ALLERGIES:  is allergic to amoxicillin, fosinopril, hydromorphone, ampicillin, monopril [fosinopril sodium], other, and rosuvastatin.  MEDICATIONS:  Current Outpatient Medications  Medication Sig Dispense Refill   folic acid (FOLVITE) 1 MG tablet Take 1 tablet (1 mg total) by mouth daily. 30 tablet 2   ondansetron (ZOFRAN) 8 MG tablet Take 1 tablet (8 mg total) by mouth every 8 (eight) hours as needed for nausea or  vomiting. Starting 3 days after chemotherapy 30 tablet 2   acetaminophen (TYLENOL 8 HOUR ARTHRITIS PAIN) 650 MG CR tablet Take 1,300 mg by mouth as needed for pain (pain).      albuterol (VENTOLIN HFA) 108 (90 Base) MCG/ACT inhaler Inhale 2 puffs into the lungs every 4 hours as needed 6.7 each 0   atorvastatin (LIPITOR) 40 MG tablet Take 1 tablet (40 mg total) by mouth daily. 90 tablet 3   Azelastine HCl 137 MCG/SPRAY SOLN PLACE 2 SPRAYS INTO BOTH NOSTRILS 2 (TWO) TIMES DAILY 30 mL 0   clindamycin (CLEOCIN) 300 MG capsule Take 300 mg by mouth See admin instructions. Uses for dental procedures     diclofenac Sodium (VOLTAREN) 1 % GEL Apply 4 g topically 4 (four) times daily. 100 g 0   dicyclomine (BENTYL) 20 MG tablet Take 20 mg by mouth as needed for spasms.     diltiazem (CARDIZEM) 30 MG tablet Take 1 tablet (30 mg total) by mouth 4 (four) times daily as needed. 60 tablet 6   dofetilide (TIKOSYN) 500 MCG capsule Take 1 capsule (500 mcg total) by mouth 2 (two) times daily. Please keep upcoming appointment in February for future refills. Thank you 180 capsule 3   ergocalciferol (VITAMIN D2) 1.25 MG (50000 UT) capsule Take 1 capsule (50,000 Units total) by mouth once a week. 12 capsule 1   feeding supplement (ENSURE ENLIVE / ENSURE PLUS) LIQD Take 237 mLs by mouth 2 (two) times daily between meals. 60 mL 0   fenofibrate 160 MG tablet TAKE 1 TABLET DAILY. PLEASE KEEP UPCOMING APPT IN MARCH WITH DR. Acie Fredrickson BEFORE ANYMORE REFILLS. 90 tablet 3   fluticasone (FLONASE) 50 MCG/ACT nasal spray Place 2 sprays into both nostrils daily. 18.2 mL 5   fluticasone (FLOVENT HFA) 110 MCG/ACT inhaler Inhale 2 puffs into the lungs in the morning and at bedtime. with spacer and rinse mouth afterwards. 1 each 5   leuprolide, 6 Month, (ELIGARD) 45 MG injection Inject 45 mg into the skin every 6 (six) months. (Patient not taking: Reported on 03/16/2021)     levocetirizine (XYZAL) 5 MG tablet Take 5 mg by mouth every evening.      loperamide (IMODIUM) 2 MG capsule Take 2 mg by mouth as needed for diarrhea or loose stools.     LORazepam (ATIVAN) 0.5 MG tablet One tab po 30 min before MRI. Repeat once if needed. 2 tablet 0   magnesium oxide (MAG-OX) 400 MG tablet TAKE 1 TABLET BY MOUTH EVERY DAY 90 tablet 2   metoprolol succinate (TOPROL XL) 25 MG 24 hr tablet Take 1 tablet (25 mg total) by mouth daily. 90 tablet 3   Multiple Vitamins-Minerals (MULTIVITAMINS THER. W/MINERALS) TABS Take 1 tablet by mouth daily.     nitroGLYCERIN (NITROSTAT) 0.4 MG SL tablet Place 1 tablet (0.4 mg total) under the tongue every 5 (five) minutes as needed for chest pain. 25 tablet 5   omeprazole (PRILOSEC) 10 MG capsule  TAKE 1 CAPSULE DAILY. 90 capsule 3   oxybutynin (DITROPAN) 5 MG tablet Take 1 tablet by mouth 2 (two) times daily as needed for bladder spasms.     potassium chloride (KLOR-CON) 10 MEQ tablet Take 1 tablet (10 mEq total) by mouth daily. 90 tablet 2   predniSONE (DELTASONE) 5 MG tablet Take 1 tablet (5 mg total) by mouth daily with breakfast for 15 days. 15 tablet 5   Saccharomyces boulardii (PROBIOTIC) 250 MG CAPS Take 1 capsule by mouth daily.     tamsulosin (FLOMAX) 0.4 MG CAPS capsule Take 0.4 mg by mouth daily.     topiramate (TOPAMAX) 50 MG tablet Take 2 tablets (100 mg total) by mouth 2 (two) times daily. 360 tablet 3   traMADol (ULTRAM) 50 MG tablet Take 1 tablet (50 mg total) by mouth every 6 (six) hours as needed for moderate pain. 30 tablet 0   traZODone (DESYREL) 50 MG tablet Take 0.5 tablets (25 mg total) by mouth at bedtime as needed for sleep. 45 tablet 3   XARELTO 20 MG TABS tablet TAKE 1 TABLET BY MOUTH EVERY DAY 90 tablet 2   No current facility-administered medications for this visit.    SURGICAL HISTORY:  Past Surgical History:  Procedure Laterality Date   ADENOIDECTOMY     ANTERIOR CERVICAL DECOMP/DISCECTOMY FUSION  07/2001; 10/2002   "C5-6; C6-7; redo"   BACK SURGERY     CARPAL TUNNEL RELEASE Left  10/2015   COLONOSCOPY W/ POLYPECTOMY  02/2014   CORONARY ANGIOPLASTY WITH STENT PLACEMENT  05/2003; 12/2005   "mid RCA; mid RCA"   FINE NEEDLE ASPIRATION  02/26/2021   Procedure: FINE NEEDLE ASPIRATION (FNA) LINEAR;  Surgeon: Candee Furbish, MD;  Location: [facility] ENDOSCOPY;  Service: Pulmonary;;   HAND SURGERY  02/2019   LEFT HAND   implantable loop recorder placement  02/27/2020   [employer]tronic Reveal Newton model [id number]  implantable loop recorder implanted by Dr Rayann Heman for afib management and evaluation of presyncope   JOINT REPLACEMENT     KNEE ARTHROSCOPY Left 10/2005   KNEE ARTHROSCOPY W/ PARTIAL MEDIAL MENISCECTOMY Left 09/2005   LUMBAR LAMINECTOMY/DECOMPRESSION MICRODISCECTOMY  03/2005   "L4-5"   POSTERIOR LUMBAR FUSION  10/2003   L5-S1; "plates, screws"   SHOULDER ARTHROSCOPY Right 08/2011   Debridement of labrum, arthroscopic distal clavicle excision   SHOULDER OPEN ROTATOR CUFF REPAIR Left 07/2014   TEE WITHOUT CARDIOVERSION  07/07/2012   Procedure: TRANSESOPHAGEAL ECHOCARDIOGRAM (TEE);  Surgeon: Fay Records, MD;  Location: [facility] ENDOSCOPY;  Service: Cardiovascular;  Laterality: N/A;   THORACENTESIS N/A 02/25/2021   Procedure: Mathews Robinsons;  Surgeon: Juanito Doom, MD;  Location: [place];  Service: Cardiopulmonary;  Laterality: N/A;   TONSILLECTOMY AND ADENOIDECTOMY  ~ 1956   TOTAL KNEE ARTHROPLASTY Left 10/2006   TRIGGER FINGER RELEASE Left 10/2015   VIDEO BRONCHOSCOPY WITH ENDOBRONCHIAL ULTRASOUND Left 02/26/2021   Procedure: VIDEO BRONCHOSCOPY WITH ENDOBRONCHIAL ULTRASOUND;  Surgeon: Candee Furbish, MD;  Location: [facility] ENDOSCOPY;  Service: Pulmonary;  Laterality: Left;  cryoprobe too thanks!    REVIEW OF SYSTEMS:   Review of Systems  Constitutional: Negative for appetite change, chills, fatigue, fever and unexpected weight change.  HENT: Negative for mouth sores, nosebleeds, sore throat and trouble swallowing.   Eyes: Negative for eye problems and icterus.   Respiratory: Positive for shortness of breath and cough. Negative for hemoptysis and wheezing.   Cardiovascular: Negative for chest pain and leg swelling.  Gastrointestinal: Negative for abdominal  pain, constipation, diarrhea, nausea and vomiting.  Genitourinary: Negative for bladder incontinence, difficulty urinating, dysuria, frequency and hematuria.   Musculoskeletal: Negative for back pain, gait problem, neck pain and neck stiffness.  Skin: Negative for itching and rash.  Neurological: Negative for dizziness, extremity weakness, gait problem, headaches, light-headedness and seizures.  Hematological: Negative for adenopathy. Does not bruise/bleed easily.  Psychiatric/Behavioral: Negative for confusion, depression and sleep disturbance. The patient is not nervous/anxious.     PHYSICAL EXAMINATION:  Blood pressure 120/65, pulse 90, temperature 97.8 F (36.6 C), temperature source Tympanic, resp. rate 19, height 6' 1.5" (1.867 m), weight 235 lb 12.8 oz (107 kg), SpO2 98 %.  ECOG PERFORMANCE STATUS: 1  Physical Exam  Constitutional: Oriented to person, place, and time and well-developed, well-nourished, and in no distress.  HENT:  Head: Normocephalic and atraumatic.  Mouth/Throat: Oropharynx is clear and moist. No oropharyngeal exudate.  Eyes: Conjunctivae are normal. Right eye exhibits no discharge. Left eye exhibits no discharge. No scleral icterus.  Neck: Normal range of motion. Neck supple.  Cardiovascular: Normal rate, regular rhythm, normal heart sounds and intact distal pulses.   Pulmonary/Chest: Effort normal. Decreased breath sounds in left lower lobe. No respiratory distress. No wheezes. No rales.  Abdominal: Soft. Bowel sounds are normal. Exhibits no distension and no mass. There is no tenderness.  Musculoskeletal: Normal range of motion. Exhibits no edema.  Lymphadenopathy:    No cervical adenopathy.  Neurological: Alert and oriented to person, place, and time. Exhibits  normal muscle tone. Gait normal. Coordination normal.  Skin: Skin is warm and dry. No rash noted. Not diaphoretic. No erythema. No pallor.  Psychiatric: Mood, memory and judgment normal.  Vitals reviewed.  LABORATORY DATA: Lab Results  Component Value Date   WBC 5.7 03/18/2021   HGB 9.5 (L) 03/18/2021   HCT 28.9 (L) 03/18/2021   MCV 91.2 03/18/2021   PLT 349 03/18/2021      Chemistry      Component Value Date/Time   NA 132 (L) 03/18/2021 1441   NA 137 01/21/2021 1553   K 4.6 03/18/2021 1441   CL 101 03/18/2021 1441   CO2 24 03/18/2021 1441   BUN 15 03/18/2021 1441   BUN 17 01/21/2021 1553   CREATININE 0.78 03/18/2021 1441   CREATININE 1.04 08/30/2016 0840      Component Value Date/Time   CALCIUM 9.1 03/18/2021 1441   ALKPHOS 55 03/18/2021 1441   AST 15 03/18/2021 1441   ALT 13 03/18/2021 1441   BILITOT 0.4 03/18/2021 1441       RADIOGRAPHIC STUDIES:  DG Chest 2 View  Result Date: 03/09/2021 CLINICAL DATA:  pleural effusion EXAM: CHEST - 2 VIEW COMPARISON:  02/25/2021 FINDINGS: Unchanged cardiomediastinal silhouette. Cardiac device overlies the left mid chest. Decreased size of the left pleural effusion. There is slight improved aeration in the left lung base. Of there is no visible pneumothorax. Bones are unchanged. IMPRESSION: Decreased size of the left pleural effusion with slight improved aeration in the left lung base. Electronically Signed   By: Maurine Simmering   On: 03/09/2021 09:45   DG Chest 2 View  Result Date: 02/24/2021 CLINICAL DATA:  Chest pain, shortness of breath. EXAM: CHEST - 2 VIEW COMPARISON:  September 04, 2020. FINDINGS: Stable cardiomegaly. No pneumothorax is noted. Right lung is clear. Interval development of moderate to large left pleural effusion is noted with associated left basilar atelectasis. Bony thorax is unremarkable. IMPRESSION: Interval development of moderate to large left pleural effusion with  associated left basilar atelectasis.  Electronically Signed   By: Marijo Conception M.D.   On: 02/24/2021 11:04   DG CHEST PORT 1 VIEW  Result Date: 03/17/2021 CLINICAL DATA:  Status post PleurX catheter placement EXAM: PORTABLE CHEST 1 VIEW COMPARISON:  04/08/2021 FINDINGS: Cardiac shadow is enlarged. Loop recorder is again identified. New PleurX catheter is noted on the left. Small residual effusion remains. No pneumothorax is identified. IMPRESSION: PleurX catheter in satisfactory position. Small residual effusion is noted. No pneumothorax is seen. Electronically Signed   By: Inez Catalina M.D.   On: 03/17/2021 16:00   DG Chest Port 1 View  Result Date: 02/25/2021 CLINICAL DATA:  Left pleural effusion and status post bedside thoracentesis. EXAM: PORTABLE CHEST 1 VIEW COMPARISON:  02/24/2021 FINDINGS: Stable heart size and appearance of implanted loop recorder. Significant decrease in volume of left pleural fluid after thoracentesis with probable small volume of fluid remaining. Underlying atelectasis/airspace disease of the left lower lung. No pneumothorax. No pulmonary edema. IMPRESSION: Significant decrease in left pleural fluid volume after thoracentesis. No pneumothorax. Underlying atelectasis/airspace disease of the left lower lung. Electronically Signed   By: Aletta Edouard M.D.   On: 02/25/2021 16:51   ECHOCARDIOGRAM COMPLETE  Result Date: 02/25/2021    ECHOCARDIOGRAM REPORT   Patient Name:   Corey [name] Date of Exam: 02/25/2021 Medical Rec #:  [id number]   Height:       73.0 in Accession #:    [id number]  Weight:       262.0 lb Date of Birth:  Apr 19, 1946  BSA:          2.413 m Patient Age:    55 years    BP:           131/80 mmHg Patient Gender: M           HR:           67 bpm. Exam Location:  Inpatient Procedure: 2D Echo, Cardiac Doppler and Color Doppler Indications:    CHF  History:        Patient has prior history of Echocardiogram examinations, most                 recent 07/19/2019. CAD, Arrythmias:Atrial Fibrillation; Risk                  Factors:Hypertension, Former Smoker and Dyslipidemia.  Sonographer:    Cammy Brochure Referring Phys: [id number] Lequita Halt  Sonographer Comments: Technically difficult study due to poor echo windows. Image acquisition challenging due to patient body habitus. IMPRESSIONS  1. Left ventricular ejection fraction, by estimation, is 60 to 65%. The left ventricle has normal function. The left ventricle has no regional wall motion abnormalities. Left ventricular diastolic parameters were normal.  2. Right ventricular systolic function is normal. The right ventricular size is normal.  3. Large pleural effusion in the left lateral region.  4. The mitral valve is normal in structure. No evidence of mitral valve regurgitation. No evidence of mitral stenosis.  5. The aortic valve is normal in structure. Aortic valve regurgitation is not visualized. No aortic stenosis is present.  6. The inferior vena cava is dilated in size with >50% respiratory variability, suggesting right atrial pressure of 8 mmHg. Comparison(s): No significant cardiac change from prior study. Prior images reviewed side by side. There is a new large left pleural effusion with large nodular pleural based masses. FINDINGS  Left Ventricle: Left ventricular ejection fraction, by estimation, is  60 to 65%. The left ventricle has normal function. The left ventricle has no regional wall motion abnormalities. The left ventricular internal cavity size was normal in size. There is  no left ventricular hypertrophy. Left ventricular diastolic parameters were normal. Right Ventricle: The right ventricular size is normal. No increase in right ventricular wall thickness. Right ventricular systolic function is normal. Left Atrium: Left atrial size was normal in size. Right Atrium: Right atrial size was normal in size. Pericardium: There is no evidence of pericardial effusion. Mitral Valve: The mitral valve is normal in structure. No evidence of mitral valve  regurgitation. No evidence of mitral valve stenosis. Tricuspid Valve: The tricuspid valve is normal in structure. Tricuspid valve regurgitation is not demonstrated. No evidence of tricuspid stenosis. Aortic Valve: The aortic valve is normal in structure. Aortic valve regurgitation is not visualized. No aortic stenosis is present. Aortic valve mean gradient measures 5.0 mmHg. Aortic valve peak gradient measures 9.2 mmHg. Aortic valve area, by VTI measures 3.69 cm. Pulmonic Valve: The pulmonic valve was normal in structure. Pulmonic valve regurgitation is not visualized. No evidence of pulmonic stenosis. Aorta: The aortic root is normal in size and structure. Venous: The inferior vena cava is dilated in size with greater than 50% respiratory variability, suggesting right atrial pressure of 8 mmHg. IAS/Shunts: No atrial level shunt detected by color flow Doppler. Additional Comments: There is a large pleural effusion in the left lateral region.  LEFT VENTRICLE PLAX 2D LVIDd:         5.70 cm  Diastology LVIDs:         3.80 cm  LV e' medial:    12.62 cm/s LV PW:         1.10 cm  LV E/e' medial:  8.6 LV IVS:        1.20 cm  LV e' lateral:   8.00 cm/s LVOT diam:     2.40 cm  LV E/e' lateral: 13.5 LV SV:         107 LV SV Index:   44 LVOT Area:     4.52 cm  RIGHT VENTRICLE            IVC RV S prime:     8.38 cm/s  IVC diam: 2.80 cm LEFT ATRIUM         Index LA diam:    3.00 cm 1.24 cm/m  AORTIC VALVE AV Area (Vmax):    4.20 cm AV Area (Vmean):   3.88 cm AV Area (VTI):     3.69 cm AV Vmax:           152.00 cm/s AV Vmean:          102.000 cm/s AV VTI:            0.289 m AV Peak Grad:      9.2 mmHg AV Mean Grad:      5.0 mmHg LVOT Vmax:         141.00 cm/s LVOT Vmean:        87.500 cm/s LVOT VTI:          0.236 m LVOT/AV VTI ratio: 0.82  AORTA Ao Root diam: 3.80 cm Ao Asc diam:  3.60 cm MITRAL VALVE MV Area (PHT): 3.31 cm     SHUNTS MV Decel Time: 229 msec     Systemic VTI:  0.24 m MV E velocity: 108.00 cm/s  Systemic  Diam: 2.40 cm MV A velocity: 87.50 cm/s MV E/A ratio:  1.23 Mihai Croitoru  MD Electronically signed by Sanda Klein MD Signature Date/Time: 02/25/2021/2:06:32 PM    Final    CUP PACEART REMOTE DEVICE CHECK  Result Date: 02/25/2021 ILR summary report received. Battery status OK. Normal device function. No new symptom, tachy, brady, or pause episodes. No new AF episodes. Monthly summary reports and ROV/PRN.  RP  CT CHEST ABDOMEN PELVIS WO CONTRAST  Result Date: 02/24/2021 CLINICAL DATA:  LEFT side chest and abdominal pain with nausea. History of coronary artery disease post coronary PTCA, hypertension, GERD, atrial fibrillation, prostate cancer, former smoker EXAM: CT CHEST, ABDOMEN AND PELVIS WITHOUT CONTRAST TECHNIQUE: Multidetector CT imaging of the chest, abdomen and pelvis was performed following the standard protocol without IV contrast. Sagittal and coronal MPR images reconstructed from axial data set. COMPARISON:  CT abdomen and pelvis 02/18/2020, CT chest 12/10/2006 FINDINGS: CT CHEST FINDINGS Cardiovascular: Atherosclerotic calcifications aorta, coronary arteries and proximal great vessels. Aneurysmal dilatation ascending thoracic aorta 4.1 cm transverse image 28 previously 3.9 cm. Heart unremarkable. No pericardial effusion. Mediastinum/Nodes: Esophagus unremarkable. New enlarged azygoesophageal recess lymph nodes measuring 19 mm image 37 and 19 mm image 35. Normal sized paratracheal nodes. 11 mm AP window node image 25 new. Base of cervical region unremarkable. Lungs/Pleura: Large LEFT pleural effusion. Suspected LEFT infrahilar mass extending into medial LEFT lower lobe with narrowing of LEFT lower lobe bronchi, ill-defined due to adjacent subtotal atelectasis in LEFT lower lobe with partial atelectasis of LEFT upper lobe. Minimal interstitial prominence at RIGHT lung base. No acute infiltrate or pneumothorax. Few small subpleural nodular foci in LEFT upper lobe, 5 mm image 69, 4 mm image 72, 5  mm image 79. Questionable 11 mm subpleural nodule in lingula image 89. Musculoskeletal: No acute osseous findings. CT ABDOMEN PELVIS FINDINGS Hepatobiliary: Gallbladder and liver normal appearance Pancreas: Normal appearance Spleen: Normal appearance Adrenals/Urinary Tract: Adrenal glands, kidneys, ureters, and bladder normal appearance Stomach/Bowel: Sigmoid diverticulosis without evidence of diverticulitis. Stomach decompressed. Normal appendix. Vascular/Lymphatic: Atherosclerotic calcifications of aorta and iliac arteries. Short segment of displaced atherosclerotic calcification at the mid to distal abdominal aorta, previously assessed by CT angio exam, felt to represent irregular plaque rather than aneurysm or penetrating ulcer, please refer to prior study. Aorta normal caliber. No adenopathy. Reproductive: Few metallic clips at the prostate gland, which is otherwise unremarkable. Seminal vesicles normal appearance. Other: No free air or free fluid. Small RIGHT inguinal hernia containing fat. Musculoskeletal: No acute osseous findings. IMPRESSION: New large LEFT pleural effusion with subtotal atelectasis of LEFT lower lobe and partial atelectasis of LEFT upper lobe. Few small subpleural nodular foci in LEFT lung, largest 11 mm, nonspecific, new since 2008. New azygoesophageal recess adenopathy. Suspicion of a LEFT infrahilar mass extending into LEFT lower lobe medially with narrowing of LEFT lower lobe bronchi, concerning for LEFT infrahilar neoplasm. Sigmoid diverticulosis without evidence of diverticulitis. Small RIGHT inguinal hernia containing fat. No acute intra-abdominal or intrapelvic abnormalities. Aortic Atherosclerosis (ICD10-I70.0). Electronically Signed   By: Lavonia Dana M.D.   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  Pending further staging work-up.  He is negative for any actionable mutations.  The patient is currently undergoing palliative radiotherapy to the obstructive lung mass in the left lung under the care of Dr. Tammi Klippel.  Last treatment expected on 03/23/2021.  Dr. [name] had a lengthly discussion with the patient today about his current condition and treatment options. The patient was given the option of a referral to hospice/palliative vs. Treatment with systemic chemotherapy with carboplatin for an AUC of 5, Alimta 500 mg/m, and Keytruda 200 mg IV every 3 weeks.  The patient is interested in proceeding with systemic chemotherapy.  He is expected to start his first dose of this treatment on 03/30/21.  We discussed the adverse side effects of treatment including but not limited to alopecia, myelosuppression, nausea and vomiting, peripheral neuropathy, liver or renal dysfunction as well as immunotherapy mediated adverse effects.   I will arrange for the patient to have a chemoeducation class prior to receiving his first cycle of chemotherapy.   We will arrange for the patient to have a B12 injection while in the clinic today.     I sent prescriptions for 1 mg folic acid p.o. daily as well as Zofran every 8 hours as needed for nausea. There is a drug interaction with compazine with one of his cardiac medications.    The patient will follow-up in 2 weeks for evaluation before starting his first cycle of treatment.   He has some baseline anemia. He deneis bleeding or bruising except the presence of blood in his malignant effusion. Dr. [name] instructed him to take iron supplements a few times per week.   They will drain his pleurex catheter as instructed by pulmonology.   The patient was advised to call immediately if he has any concerning symptoms in the interval. The patient voices understanding of current  disease status and treatment options and is in agreement with the current care plan. All questions were answered. The patient knows to call the clinic with any problems, questions or concerns. We can certainly see the patient much sooner if necessary       Orders Placed This Encounter  Procedures   CBC with Differential ([facility] Only)    Standing Status:   Standing    Number of Occurrences:   12    Standing Expiration Date:   03/18/2022   CMP ([place] only)    Standing Status:   Standing    Number of Occurrences:   12    Standing Expiration Date:   03/18/2022   TSH    Standing Status:   Standing    Number of Occurrences:   4    Standing Expiration Date:   03/18/2022       Lonzie Simmer L Avilene Marrin, PA-C 03/18/21  ADDENDUM: Hematology/Oncology Attending: I had a face-to-face encounter with the patient today.  I reviewed his record, labs and recommended his care plan.  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 The patient is still have a pending PET scan and MRI of the brain to complete the staging work-up. He had molecular studies by [facility] that showed no actionable mutations.  PD-L1 expression is still pending. He is currently undergoing a course of palliative radiotherapy to the obstructive left hilar mass. I had a lengthy discussion with the patient and his wife today about his current condition and treatment options. I gave the patient the option of palliative care and hospice referral versus consideration of palliative systemic chemotherapy with carboplatin for AUC of 5, Alimta 500 Mg/M2 and Keytruda 200 Mg  IV every 3 weeks. The patient is interested in proceeding with the systemic chemotherapy. I discussed with him the adverse effect of this treatment including but not limited to alopecia,  myelosuppression, nausea and vomiting, peripheral neuropathy, liver or renal dysfunction as well as immunotherapy adverse effects. He will receive vitamin B12 injection today. Will call his pharmacy with prescription for folic acid and Compazine. The patient will have a chemotherapy education class before the first dose of his treatment. He is expected to start the first cycle of this treatment next week. He will come back for follow-up visit in 2 weeks for evaluation and management of any adverse effect of his treatment. The patient was advised to call immediately if he has any other concerning symptoms in the interval. The total time spent in the appointment was 35 minutes. Disclaimer: This note was dictated with voice recognition software. Similar sounding words can inadvertently be transcribed and may be missed upon review. Eilleen Kempf, MD 03/18/21

## 2021-03-18 ENCOUNTER — Other Ambulatory Visit: Payer: Self-pay

## 2021-03-18 ENCOUNTER — Inpatient Hospital Stay: Payer: Medicare PPO

## 2021-03-18 ENCOUNTER — Ambulatory Visit
Admission: RE | Admit: 2021-03-18 | Discharge: 2021-03-18 | Disposition: A | Discharge status: Home / Self Care | Payer: Medicare PPO | Source: Ambulatory Visit | Attending: Radiation Oncology | Admitting: Radiation Oncology

## 2021-03-18 ENCOUNTER — Inpatient Hospital Stay (HOSPITAL_BASED_OUTPATIENT_CLINIC_OR_DEPARTMENT_OTHER): Payer: Medicare PPO | Admitting: Physician Assistant

## 2021-03-18 ENCOUNTER — Ambulatory Visit: Payer: Medicare PPO | Admitting: Neurology

## 2021-03-18 ENCOUNTER — Other Ambulatory Visit: Payer: Self-pay | Admitting: Internal Medicine

## 2021-03-18 VITALS — BP 120/65 | HR 90 | Temp 97.8°F | Resp 19 | Ht 73.5 in | Wt 235.8 lb

## 2021-03-18 DIAGNOSIS — Z510 Encounter for antineoplastic radiation therapy: Secondary | ICD-10-CM | POA: Diagnosis not present

## 2021-03-18 DIAGNOSIS — C3492 Malignant neoplasm of unspecified part of left bronchus or lung: Secondary | ICD-10-CM

## 2021-03-18 DIAGNOSIS — C3432 Malignant neoplasm of lower lobe, left bronchus or lung: Secondary | ICD-10-CM | POA: Diagnosis not present

## 2021-03-18 DIAGNOSIS — C787 Secondary malignant neoplasm of liver and intrahepatic bile duct: Secondary | ICD-10-CM | POA: Diagnosis not present

## 2021-03-18 DIAGNOSIS — Z7189 Other specified counseling: Secondary | ICD-10-CM | POA: Diagnosis not present

## 2021-03-18 DIAGNOSIS — Z79899 Other long term (current) drug therapy: Secondary | ICD-10-CM | POA: Diagnosis not present

## 2021-03-18 DIAGNOSIS — Z5112 Encounter for antineoplastic immunotherapy: Secondary | ICD-10-CM | POA: Insufficient documentation

## 2021-03-18 DIAGNOSIS — G4733 Obstructive sleep apnea (adult) (pediatric): Secondary | ICD-10-CM | POA: Diagnosis not present

## 2021-03-18 DIAGNOSIS — Z5111 Encounter for antineoplastic chemotherapy: Secondary | ICD-10-CM | POA: Insufficient documentation

## 2021-03-18 DIAGNOSIS — C778 Secondary and unspecified malignant neoplasm of lymph nodes of multiple regions: Secondary | ICD-10-CM | POA: Diagnosis not present

## 2021-03-18 DIAGNOSIS — C3412 Malignant neoplasm of upper lobe, left bronchus or lung: Secondary | ICD-10-CM | POA: Diagnosis not present

## 2021-03-18 DIAGNOSIS — J910 Malignant pleural effusion: Secondary | ICD-10-CM | POA: Diagnosis not present

## 2021-03-18 DIAGNOSIS — Z87891 Personal history of nicotine dependence: Secondary | ICD-10-CM | POA: Diagnosis not present

## 2021-03-18 LAB — CBC WITH DIFFERENTIAL (CANCER CENTER ONLY)
Abs Immature Granulocytes: 0.07 10*3/uL (ref 0.00–0.07)
Basophils Absolute: 0 10*3/uL (ref 0.0–0.1)
Basophils Relative: 1 %
Eosinophils Absolute: 0.1 10*3/uL (ref 0.0–0.5)
Eosinophils Relative: 2 %
HCT: 28.9 % — ABNORMAL LOW (ref 39.0–52.0)
Hemoglobin: 9.5 g/dL — ABNORMAL LOW (ref 13.0–17.0)
Immature Granulocytes: 1 %
Lymphocytes Relative: 5 %
Lymphs Abs: 0.3 10*3/uL — ABNORMAL LOW (ref 0.7–4.0)
MCH: 30 pg (ref 26.0–34.0)
MCHC: 32.9 g/dL (ref 30.0–36.0)
MCV: 91.2 fL (ref 80.0–100.0)
Monocytes Absolute: 0.4 10*3/uL (ref 0.1–1.0)
Monocytes Relative: 8 %
Neutro Abs: 4.8 10*3/uL (ref 1.7–7.7)
Neutrophils Relative %: 83 %
Platelet Count: 349 10*3/uL (ref 150–400)
RBC: 3.17 MIL/uL — ABNORMAL LOW (ref 4.22–5.81)
RDW: 15.2 % (ref 11.5–15.5)
WBC Count: 5.7 10*3/uL (ref 4.0–10.5)
nRBC: 0 % (ref 0.0–0.2)

## 2021-03-18 LAB — CMP (CANCER CENTER ONLY)
ALT: 13 U/L (ref 0–44)
AST: 15 U/L (ref 15–41)
Albumin: 2.8 g/dL — ABNORMAL LOW (ref 3.5–5.0)
Alkaline Phosphatase: 55 U/L (ref 38–126)
Anion gap: 7 (ref 5–15)
BUN: 15 mg/dL (ref 8–23)
CO2: 24 mmol/L (ref 22–32)
Calcium: 9.1 mg/dL (ref 8.9–10.3)
Chloride: 101 mmol/L (ref 98–111)
Creatinine: 0.78 mg/dL (ref 0.61–1.24)
GFR, Estimated: >60 mL/min (ref >60)
Glucose, Bld: 138 mg/dL — ABNORMAL HIGH (ref 70–99)
Potassium: 4.6 mmol/L (ref 3.5–5.1)
Sodium: 132 mmol/L — ABNORMAL LOW (ref 135–145)
Total Bilirubin: 0.4 mg/dL (ref 0.3–1.2)
Total Protein: 6.4 g/dL — ABNORMAL LOW (ref 6.5–8.1)

## 2021-03-18 LAB — CYTOLOGY - NON PAP

## 2021-03-18 MED ORDER — CYANOCOBALAMIN 1000 MCG/ML IJ SOLN
1000.0000 ug | Freq: Once | INTRAMUSCULAR | Status: AC
  Administered 2021-03-18: 1000 ug via INTRAMUSCULAR

## 2021-03-18 MED ORDER — FOLIC ACID 1 MG PO TABS: 1.0000 mg | ORAL_TABLET | Freq: Every day | ORAL | 2 refills | Status: DC

## 2021-03-18 MED ORDER — CYANOCOBALAMIN 1000 MCG/ML IJ SOLN
INTRAMUSCULAR | Status: AC
  Filled 2021-03-18: qty 1

## 2021-03-18 MED ORDER — ONDANSETRON HCL 8 MG PO TABS: 8.0000 mg | ORAL_TABLET | Freq: Three times a day (TID) | ORAL | 2 refills | Status: DC | PRN

## 2021-03-18 NOTE — Progress Notes (Signed)
Carelink Summary Report / Loop Recorder 

## 2021-03-18 NOTE — Progress Notes (Signed)
START ON PATHWAY REGIMEN - Non-Small Cell Lung     A cycle is every 21 days:     Pembrolizumab      Pemetrexed      Carboplatin   **Always confirm dose/schedule in your pharmacy ordering system**  Patient Characteristics: Stage IV Metastatic, Nonsquamous, Molecular Analysis Completed, Molecular Alteration Present and Targeted Therapy Exhausted OR EGFR Exon 20+ or KRAS G12C+ Present and No Prior Chemo/Immunotherapy OR No Alteration Present, Initial  Chemotherapy/Immunotherapy, PS = 0, 1, No Alteration Present, No Alteration Present, Candidate for Immunotherapy, PD-L1 Expression Positive 1-49% (TPS) / Negative / Not Tested / Awaiting Test Results and Immunotherapy Candidate Therapeutic Status: Stage IV Metastatic Histology: Nonsquamous Cell Broad Molecular Profiling Status: Molecular Analysis Completed Molecular Analysis Results: No Alteration Present ECOG Performance Status: 1 Chemotherapy/Immunotherapy Line of Therapy: Initial Chemotherapy/Immunotherapy EGFR Exons 18-21 Mutation Testing Status: Completed and Negative ALK Fusion/Rearrangement Testing Status: Completed and Negative BRAF V600 Mutation Testing Status: Completed and Negative KRAS G12C Mutation Testing Status: Completed and Negative MET Exon 14 Mutation Testing Status: Completed and Negative RET Fusion/Rearrangement Testing Status: Completed and Negative NTRK Fusion/Rearrangement Testing Status: Completed and Negative ROS1 Fusion/Rearrangement Testing Status: Completed and Negative Immunotherapy Candidate Status: Candidate for Immunotherapy PD-L1 Expression Status: Awaiting Test Results Intent of Therapy: Non-Curative / Palliative Intent, Discussed with Patient 

## 2021-03-18 NOTE — Patient Instructions (Addendum)
Summary:  -There are two main categories of lung cancer, they are named based on the size of the cancer cell. One is called Non-Small cell lung cancer. The other type is Small Cell Lung Cancer -The sample (biopsy) that they took of your tumor was consistent with a subtype of Non-small cell lung cancer called Adenocarcinoma. This is the most common type of lung cancer.  -We covered a lot of important information at your appointment today regarding what the treatment plan is moving forward. Here are the the main points that were discussed at your office visit with [place] today:  -The treatment that you will receive consists of two chemotherapy drugs, called Carboplatin and Alimta (also called Pemetrexed) and one immunotherapy drug called Keytruda (pembrolizumab).  -We are planning on starting your treatment next week on 03/30/21 but before your start your treatment, I would like you to attend a Chemotherapy Education Class. This involves having you sit down with one of our nurse educators. She will discuss with your one-on-one more details about your treatment as well as general information about resources here at the cancer center.  -Your treatment will be given once every 3 weeks. We will check your labs once a week for the first ~5 treatments just to make sure that important components of your blood are in an acceptable range -We will get a CT scan after 3 treatments to check on the progress of treatment  Medications:  -I have sent a few important medication prescriptions to your pharmacy.  -Zofran was sent to your pharmacy. This medication is for nausea. You may take this every 8 hours as needed if you feel nauseous.  -I have also sent a prescription for 1 mg of folic acid to your pharmacy. We need you to take 1 tablet every day.  -We will administer vitamin B12 every 9 weeks while you are here in the clinic. You have received your first dose today.   Referrals or Imaging:   Follow up:  -We will see  you back for a follow up visit 1 week after your first treatment to see how it went and help manage any side effects of treatment that you may have   -If you need to reach [place] at any time, the main office number to the cancer center is [phone], when you call, ask to speak to either [facility] or Dr. Worthy Flank nurse.   Pembrolizumab injection What is this medication? PEMBROLIZUMAB (pem broe liz ue mab) is a monoclonal antibody. It is used totreat certain types of cancer. This medicine may be used for other purposes; ask your health care provider orpharmacist if you have questions. COMMON BRAND NAME(S): Keytruda What should I tell my care team before I take this medication? They need to know if you have any of these conditions: autoimmune diseases like Crohn's disease, ulcerative colitis, or lupus have had or planning to have an allogeneic stem cell transplant (uses someone else's stem cells) history of organ transplant history of chest radiation nervous system problems like myasthenia gravis or Guillain-Barre syndrome an unusual or allergic reaction to pembrolizumab, other medicines, foods, dyes, or preservatives pregnant or trying to get pregnant breast-feeding How should I use this medication? This medicine is for infusion into a vein. It is given by a health careprofessional in a hospital or clinic setting. A special MedGuide will be given to you before each treatment. Be sure to readthis information carefully each time. Talk to your pediatrician regarding the use of this medicine in  children. While this drug may be prescribed for children as young as 6 months for selectedconditions, precautions do apply. Overdosage: If you think you have taken too much of this medicine contact apoison control center or emergency room at once. NOTE: This medicine is only for you. Do not share this medicine with others. What if I miss a dose? It is important not to miss your dose. Call your doctor or  health careprofessional if you are unable to keep an appointment. What may interact with this medication? Interactions have not been studied. This list may not describe all possible interactions. Give your health care provider a list of all the medicines, herbs, non-prescription drugs, or dietary supplements you use. Also tell them if you smoke, drink alcohol, or use illegaldrugs. Some items may interact with your medicine. What should I watch for while using this medication? Your condition will be monitored carefully while you are receiving thismedicine. You may need blood work done while you are taking this medicine. Do not become pregnant while taking this medicine or for 4 months after stopping it. Women should inform their doctor if they wish to become pregnant or think they might be pregnant. There is a potential for serious side effects to an unborn child. Talk to your health care professional or pharmacist for more information. Do not breast-feed an infant while taking this medicine orfor 4 months after the last dose. What side effects may I notice from receiving this medication? Side effects that you should report to your doctor or health care professionalas soon as possible: allergic reactions like skin rash, itching or hives, swelling of the face, lips, or tongue bloody or black, tarry breathing problems changes in vision chest pain chills confusion constipation cough diarrhea dizziness or feeling faint or lightheaded fast or irregular heartbeat fever flushing joint pain low blood counts - this medicine may decrease the number of white blood cells, red blood cells and platelets. You may be at increased risk for infections and bleeding. muscle pain muscle weakness pain, tingling, numbness in the hands or feet persistent headache redness, blistering, peeling or loosening of the skin, including inside the mouth signs and symptoms of high blood sugar such as dizziness; dry  mouth; dry skin; fruity breath; nausea; stomach pain; increased hunger or thirst; increased urination signs and symptoms of kidney injury like trouble passing urine or change in the amount of urine signs and symptoms of liver injury like dark urine, light-colored stools, loss of appetite, nausea, right upper belly pain, yellowing of the eyes or skin sweating swollen lymph nodes weight loss Side effects that usually do not require medical attention (report to yourdoctor or health care professional if they continue or are bothersome): decreased appetite hair loss tiredness This list may not describe all possible side effects. Call your doctor for medical advice about side effects. You may report side effects to FDA at1-800-FDA-1088. Where should I keep my medication? This drug is given in a hospital or clinic and will not be stored at home. NOTE: This sheet is a summary. It may not cover all possible information. If you have questions about this medicine, talk to your doctor, pharmacist, orhealth care provider.  2022 [employer] Standard (2019-08-22 21:44:53)

## 2021-03-18 NOTE — Telephone Encounter (Signed)
Called and spoke with patient to let him know that Dr. Valeta Harms wanted [place] to get him scheduled for follow up in about 7-10 days. Patient stated that he was feeling good this morning. Patient is now schedule with Dr. Valeta Harms. Will route to Dr. Valeta Harms as Juluis Rainier. Nothing further needed at this time.   Next Appt With Pulmonology Octavio Graves Icard, DO)03/27/2021 at  4:30 PM

## 2021-03-19 ENCOUNTER — Inpatient Hospital Stay: Payer: Medicare PPO

## 2021-03-19 ENCOUNTER — Telehealth: Payer: Self-pay | Admitting: Pulmonary Disease

## 2021-03-19 ENCOUNTER — Ambulatory Visit
Admission: RE | Admit: 2021-03-19 | Discharge: 2021-03-19 | Disposition: A | Discharge status: Home / Self Care | Payer: Medicare PPO | Source: Ambulatory Visit | Attending: Radiation Oncology | Admitting: Radiation Oncology

## 2021-03-19 ENCOUNTER — Other Ambulatory Visit: Payer: Self-pay | Admitting: Genetic Counselor

## 2021-03-19 ENCOUNTER — Other Ambulatory Visit: Payer: Self-pay | Admitting: Hematology

## 2021-03-19 ENCOUNTER — Encounter (HOSPITAL_COMMUNITY): Payer: Self-pay | Admitting: Pulmonary Disease

## 2021-03-19 ENCOUNTER — Ambulatory Visit (HOSPITAL_COMMUNITY)
Admission: RE | Admit: 2021-03-19 | Discharge: 2021-03-19 | Disposition: A | Discharge status: Home / Self Care | Payer: Medicare PPO | Source: Ambulatory Visit | Attending: Internal Medicine | Admitting: Internal Medicine

## 2021-03-19 ENCOUNTER — Inpatient Hospital Stay (HOSPITAL_BASED_OUTPATIENT_CLINIC_OR_DEPARTMENT_OTHER): Payer: Medicare PPO | Admitting: Genetic Counselor

## 2021-03-19 DIAGNOSIS — C61 Malignant neoplasm of prostate: Secondary | ICD-10-CM

## 2021-03-19 DIAGNOSIS — C3492 Malignant neoplasm of unspecified part of left bronchus or lung: Secondary | ICD-10-CM | POA: Diagnosis not present

## 2021-03-19 DIAGNOSIS — Z8042 Family history of malignant neoplasm of prostate: Secondary | ICD-10-CM | POA: Diagnosis not present

## 2021-03-19 DIAGNOSIS — Z801 Family history of malignant neoplasm of trachea, bronchus and lung: Secondary | ICD-10-CM | POA: Diagnosis not present

## 2021-03-19 DIAGNOSIS — Z510 Encounter for antineoplastic radiation therapy: Secondary | ICD-10-CM | POA: Diagnosis not present

## 2021-03-19 DIAGNOSIS — C3490 Malignant neoplasm of unspecified part of unspecified bronchus or lung: Secondary | ICD-10-CM | POA: Insufficient documentation

## 2021-03-19 DIAGNOSIS — C3432 Malignant neoplasm of lower lobe, left bronchus or lung: Secondary | ICD-10-CM | POA: Diagnosis not present

## 2021-03-19 DIAGNOSIS — Z87891 Personal history of nicotine dependence: Secondary | ICD-10-CM | POA: Diagnosis not present

## 2021-03-19 DIAGNOSIS — Z803 Family history of malignant neoplasm of breast: Secondary | ICD-10-CM

## 2021-03-19 LAB — GENETIC SCREENING ORDER

## 2021-03-19 MED ORDER — GADOBUTROL 1 MMOL/ML IV SOLN
10.0000 mL | Freq: Once | INTRAVENOUS | Status: AC | PRN
  Administered 2021-03-19: 10 mL via INTRAVENOUS

## 2021-03-19 NOTE — Progress Notes (Signed)
REFERRING PROVIDER: Curt Bears, MD [place],  [place] [address]  PRIMARY PROVIDER:  [name], MD  PRIMARY REASON FOR VISIT:  1. Malignant neoplasm of prostate ([place])   2. Non-small cell carcinoma of left lung, stage 4 ([place])   3. Family history of prostate cancer   4. Family history of breast cancer   5. Family history of lung cancer      HISTORY OF PRESENT ILLNESS:   Corey [name], a 75 y.o. male, was seen for a [place] cancer genetics consultation at the request of Dr. [name] due to a personal and family history of cancer.  Corey [name] presents to clinic today to discuss the possibility of a hereditary predisposition to cancer, genetic testing, and to further clarify his future cancer risks, as well as potential cancer risks for family members.   In 2021, at the age of 56, Corey [name] was diagnosed with Gleason 9 prostate cancer, which was treated with radiation therapy. More recently, in May of 2022, he was diagnosed with non-small cell carcinoma of the left lung. The treatment plan includes palliative radiotherapy and systemic chemotherapy.   CANCER HISTORY:  Oncology History  Malignant neoplasm of prostate ([place])  01/22/2020 Cancer Staging   Staging form: Prostate, AJCC 8th Edition - Clinical stage from 01/22/2020: Stage IIIC (cT2c, cN0, cM0, PSA: 9.6, Grade Group: 5) - Signed by Freeman Caldron, PA-C on 03/04/2020    03/04/2020 Initial Diagnosis   Malignant neoplasm of prostate ([place])    Non-small cell carcinoma of left lung, stage 4 ([place])  03/05/2021 Initial Diagnosis   Non-small cell carcinoma of left lung, stage 4 ([place])    03/05/2021 Cancer Staging   Staging form: Lung, AJCC 8th Edition - Clinical: Stage IVA (cT1b, cN1, cM1a) - Signed by Curt Bears, MD on 03/05/2021    03/31/2021 -  Chemotherapy    Patient is on Treatment Plan: LUNG CARBOPLATIN / PEMETREXED / PEMBROLIZUMAB Q21D INDUCTION X 4 CYCLES / MAINTENANCE PEMETREXED + PEMBROLIZUMAB        Primary cancer of left lower lobe of lung ([place])  03/07/2021 Initial Diagnosis   Primary cancer of left lower lobe of lung ([place])    03/18/2021 Cancer Staging   Staging form: Lung, AJCC 8th Edition - Clinical: Stage IVA (cT1b, cN1, cM1a) - Signed by Curt Bears, MD on 03/18/2021       Past Medical History:  Diagnosis Date   Anemia    Anxiety    Arthritis    "back, right knee, hands, ankles, neck" (05/31/2016)   Carotid artery disease ([place]) 08/30/2016   Carotid US 11/19: R 1-39, L 40-59 // Carotid US 08/2019: R 1-39; L 40-59 // Carotid US 11/21: R 1-39; L 40-59>> repeat 1 year    Chronic lower back pain    Coronary atherosclerosis of native coronary artery    a. BMS to [facility] 2004 and 2007, otherwise mild nonobstructive disease. EF normal.   Diverticulitis    Dyslipidemia    Dyspnea    Essential hypertension, benign    Family history of breast cancer    Family history of lung cancer    Family history of prostate cancer    GERD (gastroesophageal reflux disease)    Lumbar radiculopathy, chronic 02/04/2015   Right L5   Obesity    OSA on CPAP    uses cpap   Paroxysmal atrial fibrillation ([place])    a. Discovered after stroke.   Pneumonia 01/1996   PONV (postoperative nausea and vomiting)  Prostate CA ([place])    Recurrent upper respiratory infection (URI)    Stroke ([place]) 07/2012   no deficits   Visit for monitoring Tikosyn therapy 09/18/2019    Past Surgical History:  Procedure Laterality Date   ADENOIDECTOMY     ANTERIOR CERVICAL DECOMP/DISCECTOMY FUSION  07/2001; 10/2002   "C5-6; C6-7; redo"   BACK SURGERY     CARPAL TUNNEL RELEASE Left 10/2015   CHEST TUBE INSERTION Left 03/17/2021   Procedure: INSERTION PLEURAL DRAINAGE CATHETER;  Surgeon: Garner Nash, DO;  Location: [facility];  Service: Pulmonary;  Laterality: Left;  Indwelling Tunneled Pleural catheter (PLEUREX)    COLONOSCOPY W/ POLYPECTOMY  02/2014   CORONARY ANGIOPLASTY WITH STENT PLACEMENT  05/2003;  12/2005   "mid RCA; mid RCA"   FINE NEEDLE ASPIRATION  02/26/2021   Procedure: FINE NEEDLE ASPIRATION (FNA) LINEAR;  Surgeon: Candee Furbish, MD;  Location[facility] ENDOSCOPY;  Service: Pulmonary;;   HAND SURGERY  02/2019   LEFT HAND   implantable loop recorder placement  02/27/2020   Medtronic Reveal Bath model [id number]  implantable loop recorder implanted by Dr Rayann Heman for afib management and evaluation of presyncope   JOINT REPLACEMENT     KNEE ARTHROSCOPY Left 10/2005   KNEE ARTHROSCOPY W/ PARTIAL MEDIAL MENISCECTOMY Left 09/2005   LUMBAR LAMINECTOMY/DECOMPRESSION MICRODISCECTOMY  03/2005   "L4-5"   POSTERIOR LUMBAR FUSION  10/2003   L5-S1; "plates, screws"   SHOULDER ARTHROSCOPY Right 08/2011   Debridement of labrum, arthroscopic distal clavicle excision   SHOULDER OPEN ROTATOR CUFF REPAIR Left 07/2014   TEE WITHOUT CARDIOVERSION  07/07/2012   Procedure: TRANSESOPHAGEAL ECHOCARDIOGRAM (TEE);  Surgeon: Fay Records, MD;  Location: [facility] ENDOSCOPY;  Service: Cardiovascular;  Laterality: N/A;   THORACENTESIS N/A 02/25/2021   Procedure: Mathews Robinsons;  Surgeon: Juanito Doom, MD;  Location: [facility];  Service: Cardiopulmonary;  Laterality: N/A;   TONSILLECTOMY AND ADENOIDECTOMY  ~ 1956   TOTAL KNEE ARTHROPLASTY Left 10/2006   TRIGGER FINGER RELEASE Left 10/2015   VIDEO BRONCHOSCOPY WITH ENDOBRONCHIAL ULTRASOUND Left 02/26/2021   Procedure: VIDEO BRONCHOSCOPY WITH ENDOBRONCHIAL ULTRASOUND;  Surgeon: Candee Furbish, MD;  Location: [facility] ENDOSCOPY;  Service: Pulmonary;  Laterality: Left;  cryoprobe too thanks!    Social History   Socioeconomic History   Marital status: Married    Spouse name: Renee   Number of children: 3   Years of education: 14   Highest education level: Not on file  Occupational History   Occupation: Retired    Fish farm manager: DISABLED     Comment: Works at [employer] part time  Tobacco Use   Smoking status: Former    Packs/day: 1.00    Years: 34.00    Pack  years: 34.00    Types: Cigarettes    Quit date: 05/05/2003    Years since quitting: 17.8   Smokeless tobacco: Never  Vaping Use   Vaping Use: Never used  Substance and Sexual Activity   Alcohol use: No    Alcohol/week: 0.0 standard drinks   Drug use: No   Sexual activity: Not Currently  Other Topics Concern   Not on file  Social History Narrative   Patient lives at home with spouse.   Caffeine Use:    Social Determinants of Health   Financial Resource Strain: Not on file  Food Insecurity: Not on file  Transportation Needs: Not on file  Physical Activity: Not on file  Stress: Not on file  Social Connections: Not on file  FAMILY HISTORY:  We obtained a detailed, 4-generation family history.  Significant diagnoses are listed below: Family History  Problem Relation Age of Onset   Hypertension Mother    Heart disease Father    Heart attack Father    Prostate cancer Brother 43   Parkinson's disease Brother    Lung cancer Paternal Aunt        hx of smoking   Breast cancer Paternal Grandmother        dx 26s, bilateral mastectomies   Heart attack Paternal Grandfather    Blindness Son    Colon cancer Neg Hx    Pancreatic cancer Neg Hx    Corey [name] has one daughter (age 59) and two sons (ages 72 and 35). He has two brothers, one of whom is an identical twin. His twin brother had prostate cancer (Gleason 7) diagnosed at age 50.  Corey [name]'s mother died at age 31 without cancer. There were three maternal aunts and one maternal uncle. There is no known cancer among maternal aunts/uncles or maternal cousins. Corey [name] maternal grandmother died in her 72s without cancer. His maternal grandfather died older than 3 without cancer.  Corey [name] father died at age 58 without cancer. There were two paternal aunts. One aunt had lung cancer and a history of smoking. There is no known cancer among paternal cousins. Corey [name] paternal grandmother died in her 61s with breast cancer,  which was treated with bilateral mastectomies. His paternal grandfather died at age 51 without cancer.   Mr. Vo is unaware of previous family history of genetic testing for hereditary cancer risks. Patient's maternal ancestors are of unknown, possibly [place] descent, and paternal ancestors are of [place] and [place] descent. There is no reported Ashkenazi Jewish ancestry. There is no known consanguinity.  GENETIC COUNSELING ASSESSMENT: Corey [name] is a 75 y.o. male with a personal history of prostate cancer and lung cancer and a family history of prostate cancer, breast cancer, and lung cancer, which is somewhat suggestive of a hereditary cancer syndrome and predisposition to cancer. We, therefore, discussed and recommended the following at today's visit.   DISCUSSION: We discussed that approximately 5-10% of cancer is hereditary, with most cases of hereditary prostate cancer associated with the BRCA1 and BRCA2 genes. There are other genes that can be associated with hereditary prostate cancer syndromes. These include ATM, CHEK2, HOXB13, etc. We discussed that testing is beneficial for several reasons, including knowing about other cancer risks, identifying potential screening and risk-reduction options that may be appropriate, and to understand if other family members could be at risk for cancer and allow them to undergo genetic testing.   We reviewed the characteristics, features and inheritance patterns of hereditary cancer syndromes. We also discussed genetic testing, including the appropriate family members to test, the process of testing, insurance coverage and turn-around-time for results. We discussed the implications of a negative, positive and/or variant of uncertain significant result. We recommended Corey [name] pursue genetic testing for the [facility] Common Hereditary Cancers + Prostate Cancer HRR panel.   The Common Hereditary Cancers Panel offered by [facility] includes sequencing  and/or deletion duplication testing of the following 47 genes: APC, ATM, AXIN2, BARD1, BMPR1A, BRCA1, BRCA2, BRIP1, CDH1, CDK4, CDKN2A (p14ARF), CDKN2A (p16INK4a), CHEK2, CTNNA1, DICER1, EPCAM (Deletion/duplication testing only), GREM1 (promoter region deletion/duplication testing only), KIT, MEN1, MLH1, MSH2, MSH3, MSH6, MUTYH, NBN, NF1, NTHL1, PALB2, PDGFRA, PMS2, POLD1, POLE, PTEN, RAD50, RAD51C, RAD51D, SDHB, SDHC, SDHD, SMAD4, SMARCA4. STK11, TP53, TSC1, TSC2,  and VHL.  The following genes were evaluated for sequence changes only: SDHA and HOXB13 c.251G>A variant only. The Prostate Cancer HRR Panel offered by [facility] includes sequencing and/or deletion duplication testing of the following 10 genes: ATM, BARD1, BRCA1, BRCA2, BRIP1, CHEK2, FANCL, PALB2, RAD51C, RAD51D.   Based on Mr. Koepp personal and family history of cancer, he meets medical criteria for genetic testing. Despite that he meets criteria, there may still be an out of pocket cost.   PLAN: After considering the risks, benefits, and limitations, Corey [name] provided informed consent to pursue genetic testing and the blood sample was sent to [facility] for analysis of the Common Hereditary Cancers + Prostate Cancer HRR panel. Results should be available within approximately two-three weeks' time, at which point they will be disclosed by telephone to Corey [name], as will any additional recommendations warranted by these results. Corey [name] will receive a summary of his genetic counseling visit and a copy of his results once available. This information will also be available in Epic.   Corey [name] questions were answered to his satisfaction today. Our contact information was provided should additional questions or concerns arise. Thank you for the referral and allowing [place] to share in the care of your patient.   Corey [name], [place], [facility] Licensed, Certified Dispensing [email or web address] Phone: [phone]  The  patient was seen for a total of 40 minutes in face-to-face genetic counseling. Patient was seen with his two brothers, Corey [name] and Corey [name]. This patient was discussed with Drs. Magrinat, Lindi Adie and/or Burr Medico who agrees with the above.    _______________________________________________________________________ For Office Staff:  Number of people involved in session: 1 Was an Intern/ student involved with case: no

## 2021-03-20 ENCOUNTER — Ambulatory Visit
Admission: RE | Admit: 2021-03-20 | Discharge: 2021-03-20 | Disposition: A | Discharge status: Home / Self Care | Payer: Medicare PPO | Source: Ambulatory Visit | Attending: Radiation Oncology | Admitting: Radiation Oncology

## 2021-03-20 ENCOUNTER — Encounter: Payer: Self-pay | Admitting: Genetic Counselor

## 2021-03-20 ENCOUNTER — Telehealth: Payer: Self-pay | Admitting: Pulmonary Disease

## 2021-03-20 ENCOUNTER — Emergency Department (HOSPITAL_BASED_OUTPATIENT_CLINIC_OR_DEPARTMENT_OTHER)
Admission: EM | Admit: 2021-03-20 | Discharge: 2021-03-21 | Disposition: A | Discharge status: Home / Self Care | Payer: Medicare PPO | Attending: Emergency Medicine | Admitting: Emergency Medicine

## 2021-03-20 ENCOUNTER — Emergency Department (HOSPITAL_BASED_OUTPATIENT_CLINIC_OR_DEPARTMENT_OTHER): Payer: Medicare PPO

## 2021-03-20 ENCOUNTER — Other Ambulatory Visit: Payer: Self-pay

## 2021-03-20 DIAGNOSIS — Z87891 Personal history of nicotine dependence: Secondary | ICD-10-CM | POA: Diagnosis not present

## 2021-03-20 DIAGNOSIS — J45909 Unspecified asthma, uncomplicated: Secondary | ICD-10-CM | POA: Insufficient documentation

## 2021-03-20 DIAGNOSIS — Z79899 Other long term (current) drug therapy: Secondary | ICD-10-CM | POA: Insufficient documentation

## 2021-03-20 DIAGNOSIS — I10 Essential (primary) hypertension: Secondary | ICD-10-CM | POA: Insufficient documentation

## 2021-03-20 DIAGNOSIS — R0789 Other chest pain: Secondary | ICD-10-CM | POA: Diagnosis not present

## 2021-03-20 DIAGNOSIS — Z7901 Long term (current) use of anticoagulants: Secondary | ICD-10-CM | POA: Insufficient documentation

## 2021-03-20 DIAGNOSIS — J90 Pleural effusion, not elsewhere classified: Secondary | ICD-10-CM | POA: Diagnosis not present

## 2021-03-20 DIAGNOSIS — I4891 Unspecified atrial fibrillation: Secondary | ICD-10-CM | POA: Insufficient documentation

## 2021-03-20 DIAGNOSIS — Z96652 Presence of left artificial knee joint: Secondary | ICD-10-CM | POA: Insufficient documentation

## 2021-03-20 DIAGNOSIS — T85848A Pain due to other internal prosthetic devices, implants and grafts, initial encounter: Secondary | ICD-10-CM | POA: Diagnosis not present

## 2021-03-20 DIAGNOSIS — Z8546 Personal history of malignant neoplasm of prostate: Secondary | ICD-10-CM | POA: Insufficient documentation

## 2021-03-20 DIAGNOSIS — Z85118 Personal history of other malignant neoplasm of bronchus and lung: Secondary | ICD-10-CM | POA: Diagnosis not present

## 2021-03-20 DIAGNOSIS — R079 Chest pain, unspecified: Secondary | ICD-10-CM | POA: Diagnosis not present

## 2021-03-20 DIAGNOSIS — Z7951 Long term (current) use of inhaled steroids: Secondary | ICD-10-CM | POA: Diagnosis not present

## 2021-03-20 DIAGNOSIS — I2510 Atherosclerotic heart disease of native coronary artery without angina pectoris: Secondary | ICD-10-CM | POA: Insufficient documentation

## 2021-03-20 DIAGNOSIS — Z803 Family history of malignant neoplasm of breast: Secondary | ICD-10-CM | POA: Insufficient documentation

## 2021-03-20 DIAGNOSIS — C3432 Malignant neoplasm of lower lobe, left bronchus or lung: Secondary | ICD-10-CM | POA: Diagnosis not present

## 2021-03-20 DIAGNOSIS — C3490 Malignant neoplasm of unspecified part of unspecified bronchus or lung: Secondary | ICD-10-CM | POA: Diagnosis not present

## 2021-03-20 DIAGNOSIS — Z801 Family history of malignant neoplasm of trachea, bronchus and lung: Secondary | ICD-10-CM | POA: Insufficient documentation

## 2021-03-20 DIAGNOSIS — Z8042 Family history of malignant neoplasm of prostate: Secondary | ICD-10-CM | POA: Insufficient documentation

## 2021-03-20 LAB — CBC WITH DIFFERENTIAL/PLATELET
Abs Immature Granulocytes: 0.07 10*3/uL (ref 0.00–0.07)
Basophils Absolute: 0 10*3/uL (ref 0.0–0.1)
Basophils Relative: 1 %
Eosinophils Absolute: 0.2 10*3/uL (ref 0.0–0.5)
Eosinophils Relative: 3 %
HCT: 28.5 % — ABNORMAL LOW (ref 39.0–52.0)
Hemoglobin: 9.2 g/dL — ABNORMAL LOW (ref 13.0–17.0)
Immature Granulocytes: 1 %
Lymphocytes Relative: 8 %
Lymphs Abs: 0.4 10*3/uL — ABNORMAL LOW (ref 0.7–4.0)
MCH: 29.4 pg (ref 26.0–34.0)
MCHC: 32.3 g/dL (ref 30.0–36.0)
MCV: 91.1 fL (ref 80.0–100.0)
Monocytes Absolute: 0.5 10*3/uL (ref 0.1–1.0)
Monocytes Relative: 10 %
Neutro Abs: 3.8 10*3/uL (ref 1.7–7.7)
Neutrophils Relative %: 77 %
Platelets: 346 10*3/uL (ref 150–400)
RBC: 3.13 MIL/uL — ABNORMAL LOW (ref 4.22–5.81)
RDW: 15.5 % (ref 11.5–15.5)
WBC: 4.9 10*3/uL (ref 4.0–10.5)
nRBC: 0 % (ref 0.0–0.2)

## 2021-03-20 LAB — COMPREHENSIVE METABOLIC PANEL
ALT: 15 U/L (ref 0–44)
AST: 16 U/L (ref 15–41)
Albumin: 3.3 g/dL — ABNORMAL LOW (ref 3.5–5.0)
Alkaline Phosphatase: 44 U/L (ref 38–126)
Anion gap: 8 (ref 5–15)
BUN: 16 mg/dL (ref 8–23)
CO2: 24 mmol/L (ref 22–32)
Calcium: 9.1 mg/dL (ref 8.9–10.3)
Chloride: 100 mmol/L (ref 98–111)
Creatinine, Ser: 0.78 mg/dL (ref 0.61–1.24)
GFR, Estimated: >60 mL/min (ref >60)
Glucose, Bld: 111 mg/dL — ABNORMAL HIGH (ref 70–99)
Potassium: 4 mmol/L (ref 3.5–5.1)
Sodium: 132 mmol/L — ABNORMAL LOW (ref 135–145)
Total Bilirubin: 0.4 mg/dL (ref 0.3–1.2)
Total Protein: 6 g/dL — ABNORMAL LOW (ref 6.5–8.1)

## 2021-03-20 LAB — TROPONIN I (HIGH SENSITIVITY)
Troponin I (High Sensitivity): 7 ng/L (ref <18)
Troponin I (High Sensitivity): 7 ng/L (ref <18)

## 2021-03-20 MED ORDER — LACTATED RINGERS IV BOLUS
500.0000 mL | Freq: Once | INTRAVENOUS | Status: AC
  Administered 2021-03-21: 500 mL via INTRAVENOUS

## 2021-03-20 NOTE — ED Triage Notes (Signed)
Pt to ER from home with c/o left sided chest pain that radiates to left shoulder and back that started at 1915 this evening when Pt was performing at home procedure for draining and flushing left sided chest port that pt has for stage 4 lung cancer. Pt states that during procedure he started experiencing left sided chest pain and SOB until he re-clamped his port and D/C the procedure.

## 2021-03-20 NOTE — Telephone Encounter (Signed)
Called Corey [name] but she did not answer. Left message for her to call back.   I looked in the patient's chart and did not see any orders for a home health nurse. Patient did have a Pleurx catheter inserted on 06/14.

## 2021-03-20 NOTE — ED Provider Notes (Signed)
[place] [facility] EMERGENCY DEPT Provider Note   CSN: [id number] Arrival date & time: 03/20/21  2025     History Chief Complaint  Patient presents with   Chest Pain    Corey [name] is a 75 y.o. male.  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   Was drawing off fluid, only got about 500cc of fluid off and suddenly large cramp, pain encompassed whole left side of lung, pain radiated down fro mwhere port was to collar bone.  Slowed the flow valve, then a few minutes after that it subsided.  Not sure if should continue to try to drain more, connected bottle and closed it. [facility] pulmonology, oxygen was reading 91% but not sure accuracy.   Did have some pain on Wednesday but no significant change but tonight was different. Severe 10/10 pain. Does not think the tube was removed or shifted. NO fever, no new cough. At this time denies chest pain, no dyspnea.    Past Medical History:  Diagnosis Date   Anemia    Anxiety    Arthritis    "back, right knee, hands, ankles, neck" (05/31/2016)   Carotid artery disease ([place]) 08/30/2016   Carotid US 11/19: R 1-39, L 40-59 // Carotid US 08/2019: R 1-39; L 40-59 // Carotid US 11/21: R 1-39; L 40-59>> repeat 1 year    Chronic lower back pain    Coronary atherosclerosis of native coronary artery    a. BMS to [facility] 2004 and 2007, otherwise mild nonobstructive disease. EF normal.   Diverticulitis    Dyslipidemia    Dyspnea    Essential hypertension, benign    Family history of breast cancer    Family history of lung cancer    Family history of prostate cancer    GERD (gastroesophageal reflux disease)    Lumbar radiculopathy, chronic 02/04/2015   Right L5   Obesity    OSA on CPAP    uses cpap    Paroxysmal atrial fibrillation ([place])    a. Discovered after stroke.   Pneumonia 01/1996   PONV (postoperative nausea and vomiting)    Prostate CA ([place])    Recurrent upper respiratory infection (URI)    Stroke ([place]) 07/2012   no deficits   Visit for monitoring Tikosyn therapy 09/18/2019    Patient Active Problem List   Diagnosis Date Noted   Family history of prostate cancer 03/20/2021   Family history of breast cancer 03/20/2021   Family history of lung cancer 03/20/2021   Goals of care, counseling/discussion 03/18/2021   Encounter for antineoplastic chemotherapy 03/18/2021   Encounter for antineoplastic immunotherapy 03/18/2021   Primary cancer of left lower lobe of lung ([place]) 03/07/2021   Non-small cell carcinoma of left lung, stage 4 ([place]) 03/05/2021   Malnutrition of moderate degree 02/25/2021   Pleural effusion on left 02/24/2021   Hilar mass    Reactive airway disease 11/20/2020   Allergic rhinitis with non-allergic component 09/18/2020   Heartburn 09/18/2020   Coughing 09/18/2020   Malignant neoplasm of prostate ([place]) 03/04/2020   Pre-syncope 02/27/2020   Cerebral embolism with transient ischemic attack (TIA) 02/11/2020   History of stroke 63/84/6659   Embolic stroke involving cerebral artery ([place]) 02/11/2020   Secondary hypercoagulable state ([place]) 09/10/2019  Essential tremor 06/25/2019   Chest pain 05/27/2017   Demand ischemia ([place]) 05/27/2017   Morbid obesity ([place]) 05/27/2017   Carotid artery disease ([place]) 08/30/2016   Essential hypertension 08/30/2016   Diverticulitis of intestine without perforation or abscess without bleeding    Hyponatremia 06/04/2016   Diverticulitis 05/31/2016   Iron deficiency 01/05/2016   Lumbar radiculopathy, chronic 02/04/2015   SVT (supraventricular tachycardia) ([place]) 12/03/2014   PAC (premature atrial contraction) 04/05/2014   OSA on CPAP 03/27/2014   AKI (acute kidney injury) ([place]) 12/05/2013   Nausea 12/05/2013   Sepsis ([place])  12/05/2013   Abdominal pain 12/05/2013   Atrial fibrillation ([place]) 01/05/2013   CVA (cerebral infarction) 07/06/2012   Coronary artery disease    Dyslipidemia    GERD (gastroesophageal reflux disease)     Past Surgical History:  Procedure Laterality Date   ADENOIDECTOMY     ANTERIOR CERVICAL DECOMP/DISCECTOMY FUSION  07/2001; 10/2002   "C5-6; C6-7; redo"   BACK SURGERY     CARPAL TUNNEL RELEASE Left 10/2015   CHEST TUBE INSERTION Left 03/17/2021   Procedure: INSERTION PLEURAL DRAINAGE CATHETER;  Surgeon: Garner Nash, DO;  Location: [facility] ENDOSCOPY;  Service: Pulmonary;  Laterality: Left;  Indwelling Tunneled Pleural catheter (PLEUREX)    COLONOSCOPY W/ POLYPECTOMY  02/2014   CORONARY ANGIOPLASTY WITH STENT PLACEMENT  05/2003; 12/2005   "mid RCA; mid RCA"   FINE NEEDLE ASPIRATION  02/26/2021   Procedure: FINE NEEDLE ASPIRATION (FNA) LINEAR;  Surgeon: Candee Furbish, MD;  Location[facility];  Service: Pulmonary;;   HAND SURGERY  02/2019   LEFT HAND   implantable loop recorder placement  02/27/2020   Medtronic Reveal [place] model [id number]  implantable loop recorder implanted by Dr Rayann Heman for afib management and evaluation of presyncope   JOINT REPLACEMENT     KNEE ARTHROSCOPY Left 10/2005   KNEE ARTHROSCOPY W/ PARTIAL MEDIAL MENISCECTOMY Left 09/2005   LUMBAR LAMINECTOMY/DECOMPRESSION MICRODISCECTOMY  03/2005   "L4-5"   POSTERIOR LUMBAR FUSION  10/2003   L5-S1; "plates, screws"   SHOULDER ARTHROSCOPY Right 08/2011   Debridement of labrum, arthroscopic distal clavicle excision   SHOULDER OPEN ROTATOR CUFF REPAIR Left 07/2014   TEE WITHOUT CARDIOVERSION  07/07/2012   Procedure: TRANSESOPHAGEAL ECHOCARDIOGRAM (TEE);  Surgeon: Fay Records, MD;  Location: [facility] ENDOSCOPY;  Service: Cardiovascular;  Laterality: N/A;   THORACENTESIS N/A 02/25/2021   Procedure: Mathews Robinsons;  Surgeon: Juanito Doom, MD;  Location: [facility];  Service: Cardiopulmonary;  Laterality: N/A;    TONSILLECTOMY AND ADENOIDECTOMY  ~ 1956   TOTAL KNEE ARTHROPLASTY Left 10/2006   TRIGGER FINGER RELEASE Left 10/2015   VIDEO BRONCHOSCOPY WITH ENDOBRONCHIAL ULTRASOUND Left 02/26/2021   Procedure: VIDEO BRONCHOSCOPY WITH ENDOBRONCHIAL ULTRASOUND;  Surgeon: Candee Furbish, MD;  Location: [facility] ENDOSCOPY;  Service: Pulmonary;  Laterality: Left;  cryoprobe too thanks!       Family History  Problem Relation Age of Onset   Hypertension Mother    Heart disease Father    Heart attack Father    Prostate cancer Brother 13   Parkinson's disease Brother    Lung cancer Paternal Aunt        hx of smoking   Breast cancer Paternal Grandmother        dx 47s, bilateral mastectomies   Heart attack Paternal Grandfather    Blindness Son    Colon cancer Neg Hx    Pancreatic cancer Neg Hx     Social History   Tobacco Use  Smoking status: Former    Packs/day: 1.00    Years: 34.00    Pack years: 34.00    Types: Cigarettes    Quit date: 05/05/2003    Years since quitting: 17.8   Smokeless tobacco: Never  Vaping Use   Vaping Use: Never used  Substance Use Topics   Alcohol use: No    Alcohol/week: 0.0 standard drinks   Drug use: No    Home Medications Prior to Admission medications   Medication Sig Start Date End Date Taking? Authorizing Provider  acetaminophen (TYLENOL 8 HOUR ARTHRITIS PAIN) 650 MG CR tablet Take 1,300 mg by mouth as needed for pain (pain).     [provider]  albuterol (VENTOLIN HFA) 108 (90 Base) MCG/ACT inhaler Inhale 2 puffs into the lungs every 4 hours as needed 01/08/21   Garnet Sierras, DO  atorvastatin (LIPITOR) 40 MG tablet Take 1 tablet (40 mg total) by mouth daily. 01/21/21   Nahser, Wonda Cheng, MD  Azelastine HCl 137 MCG/SPRAY SOLN PLACE 2 SPRAYS INTO BOTH NOSTRILS 2 (TWO) TIMES DAILY 03/09/21   Garnet Sierras, DO  clindamycin (CLEOCIN) 300 MG capsule Take 300 mg by mouth See admin instructions. Uses for dental procedures    [provider]  diclofenac Sodium  (VOLTAREN) 1 % GEL Apply 4 g topically 4 (four) times daily. 02/27/21   Mikhail, Velta Addison, DO  dicyclomine (BENTYL) 20 MG tablet Take 20 mg by mouth as needed for spasms.    [provider]  diltiazem (CARDIZEM) 30 MG tablet Take 1 tablet (30 mg total) by mouth 4 (four) times daily as needed. 01/21/21   Nahser, Wonda Cheng, MD  dofetilide (TIKOSYN) 500 MCG capsule Take 1 capsule (500 mcg total) by mouth 2 (two) times daily. Please keep upcoming appointment in February for future refills. Thank you 01/21/21   Nahser, Wonda Cheng, MD  feeding supplement (ENSURE ENLIVE / ENSURE PLUS) LIQD Take 237 mLs by mouth 2 (two) times daily between meals. 02/27/21 03/29/21  Mikhail, Velta Addison, DO  fenofibrate 160 MG tablet TAKE 1 TABLET DAILY. PLEASE KEEP UPCOMING APPT IN MARCH WITH DR. Acie Fredrickson BEFORE ANYMORE REFILLS. 01/21/21   Nahser, Wonda Cheng, MD  fluticasone (FLONASE) 50 MCG/ACT nasal spray Place 2 sprays into both nostrils daily. 09/18/20   Garnet Sierras, DO  fluticasone (FLOVENT HFA) 110 MCG/ACT inhaler Inhale 2 puffs into the lungs in the morning and at bedtime. with spacer and rinse mouth afterwards. 12/12/20   Garnet Sierras, DO  folic acid (FOLVITE) 1 MG tablet Take 1 tablet (1 mg total) by mouth daily. 03/18/21   Heilingoetter, Cassandra L, PA-C  leuprolide, 6 Month, (ELIGARD) 45 MG injection Inject 45 mg into the skin every 6 (six) months. Patient not taking: Reported on 03/16/2021    [provider]  levocetirizine (XYZAL) 5 MG tablet Take 5 mg by mouth every evening.    [provider]  loperamide (IMODIUM) 2 MG capsule Take 2 mg by mouth as needed for diarrhea or loose stools.    [provider]  LORazepam (ATIVAN) 0.5 MG tablet One tab po 30 min before MRI. Repeat once if needed. 03/09/21   Curt Bears, MD  magnesium oxide (MAG-OX) 400 MG tablet TAKE 1 TABLET BY MOUTH EVERY DAY 09/10/20   Allred, Jeneen Rinks, MD  metoprolol succinate (TOPROL XL) 25 MG 24 hr tablet Take 1 tablet (25 mg total)  by mouth daily. 08/04/20   Thompson Grayer, MD  Multiple Vitamins-Minerals (MULTIVITAMINS THER. W/MINERALS)  TABS Take 1 tablet by mouth daily.    [provider]  nitroGLYCERIN (NITROSTAT) 0.4 MG SL tablet Place 1 tablet (0.4 mg total) under the tongue every 5 (five) minutes as needed for chest pain. 01/21/21   Nahser, Wonda Cheng, MD  omeprazole (PRILOSEC) 10 MG capsule TAKE 1 CAPSULE DAILY. 01/21/21   Nahser, Wonda Cheng, MD  ondansetron (ZOFRAN) 8 MG tablet Take 1 tablet (8 mg total) by mouth every 8 (eight) hours as needed for nausea or vomiting. Starting 3 days after chemotherapy 03/18/21   Heilingoetter, Cassandra L, PA-C  oxybutynin (DITROPAN) 5 MG tablet Take 1 tablet by mouth 2 (two) times daily as needed for bladder spasms.    [provider]  potassium chloride (KLOR-CON) 10 MEQ tablet Take 1 tablet (10 mEq total) by mouth daily. 10/23/20   Nahser, Wonda Cheng, MD  predniSONE (DELTASONE) 5 MG tablet Take 1 tablet (5 mg total) by mouth daily with breakfast for 15 days. 03/12/21 03/27/21  Tyler Pita, MD  Saccharomyces boulardii (PROBIOTIC) 250 MG CAPS Take 1 capsule by mouth daily.    [provider]  tamsulosin (FLOMAX) 0.4 MG CAPS capsule Take 0.4 mg by mouth daily.    [provider]  topiramate (TOPAMAX) 50 MG tablet Take 2 tablets (100 mg total) by mouth 2 (two) times daily. 01/16/20   Frann Rider, NP  traMADol (ULTRAM) 50 MG tablet Take 1 tablet (50 mg total) by mouth every 6 (six) hours as needed for moderate pain. 02/27/21   Cristal Ford, DO  traZODone (DESYREL) 50 MG tablet Take 0.5 tablets (25 mg total) by mouth at bedtime as needed for sleep. 09/22/20   Frann Rider, NP  Vitamin D, Ergocalciferol, (DRISDOL) 1.25 MG (50000 UNIT) CAPS capsule TAKE 1 CAPSULE BY MOUTH ONE TIME PER WEEK 03/19/21   Brunetta Genera, MD  XARELTO 20 MG TABS tablet TAKE 1 TABLET BY MOUTH EVERY DAY 10/06/20   Nahser, Wonda Cheng, MD    Allergies    Amoxicillin, Fosinopril,  Hydromorphone, Ampicillin, Monopril [fosinopril sodium], Other, and Rosuvastatin  Review of Systems   Review of Systems  Constitutional:  Negative for fever.  Respiratory:  Negative for shortness of breath.   Cardiovascular:  Positive for chest pain.  Gastrointestinal:  Negative for abdominal pain, nausea and vomiting.  Genitourinary:  Negative for difficulty urinating.  Musculoskeletal:  Negative for neck stiffness.  Skin:  Negative for rash.  Neurological:  Negative for syncope.   Physical Exam Updated Vital Signs BP 116/75   Pulse 79   Temp 98.4 F (36.9 C) (Oral)   Resp (!) 21   Ht 6\' 2"  (1.88 m)   Wt 108.9 kg   SpO2 96%   BMI 30.81 kg/m   Physical Exam Vitals and nursing note reviewed.  Constitutional:      General: He is not in acute distress.    Appearance: He is well-developed. He is not diaphoretic.  HENT:     Head: Normocephalic and atraumatic.  Eyes:     Conjunctiva/sclera: Conjunctivae normal.  Cardiovascular:     Rate and Rhythm: Normal rate and regular rhythm.     Heart sounds: Normal heart sounds. No murmur heard.   No friction rub. No gallop.  Pulmonary:     Effort: Pulmonary effort is normal. No respiratory distress.     Breath sounds: Decreased breath sounds (left base) present. No wheezing or rales.  Abdominal:     General: There is no distension.  Palpations: Abdomen is soft.     Tenderness: There is no abdominal tenderness. There is no guarding.  Musculoskeletal:     Cervical back: Normal range of motion.  Skin:    General: Skin is warm and dry.  Neurological:     Mental Status: He is alert and oriented to person, place, and time.    ED Results / Procedures / Treatments   Labs (all labs ordered are listed, but only abnormal results are displayed) Labs Reviewed  CBC WITH DIFFERENTIAL/PLATELET - Abnormal; Notable for the following components:      Result Value   RBC 3.13 (*)    Hemoglobin 9.2 (*)    HCT 28.5 (*)    Lymphs Abs 0.4  (*)    All other components within normal limits  COMPREHENSIVE METABOLIC PANEL - Abnormal; Notable for the following components:   Sodium 132 (*)    Glucose, Bld 111 (*)    Total Protein 6.0 (*)    Albumin 3.3 (*)    All other components within normal limits  TROPONIN I (HIGH SENSITIVITY)  TROPONIN I (HIGH SENSITIVITY)    EKG NoneNormal sinus rhythm Prolonged QT Abnormal ECG No significant change since last tracing Confirmed by Gareth Morgan [phone]) on 03/20/2021 9:09:58 PM Vent rate 87 PR 190 QRS 94 Qtc 507    Radiology DG Chest 1 View  Result Date: 03/20/2021 CLINICAL DATA:  History of stage IV lung cancer. EXAM: CHEST  1 VIEW COMPARISON:  Radiograph 03/17/2021.  CT 02/24/2021 FINDINGS: Patient's left-sided PleurX catheter is not well-defined on the current exam, faintly visualized in the mid lower thorax. Left pleural effusion and hazy opacity at the left lung base, similar to prior exam allowing for differences in technique. Chronic left lung volume loss. Implanted loop recorder projects over the left chest wall. Stable heart size and mediastinal contours with aortic atherosclerosis the right lung is clear. No pneumothorax. IMPRESSION: Left-sided PleurX catheter is not well-defined on the current exam, faintly visualized in the mid lower thorax. Similar appearance of left pleural effusion and basilar opacity to prior exam allowing for differences in technique. No pneumothorax. Electronically Signed   By: Keith Rake M.D.   On: 03/20/2021 21:14   CT Chest Wo Contrast  Result Date: 03/21/2021 CLINICAL DATA:  Chest pain and shortness of breath EXAM: CT CHEST WITHOUT CONTRAST TECHNIQUE: Multidetector CT imaging of the chest was performed following the standard protocol without IV contrast. COMPARISON:  None. FINDINGS: Cardiovascular: Calcific aortic atherosclerosis and coronary artery atherosclerosis. Trace pericardial fluid. Heart size is normal. Mediastinum/Nodes: 13 mm  preaortic lymph node. 15 mm subcarinal lymph node. No axillary adenopathy. Esophagus is unremarkable. Lungs/Pleura: Filling defects in the left lower lobe bronchial tree with associated atelectasis. Right lung is clear. Small left pleural effusion. There is a tunneled pleural catheter at the left lung base. Unchanged nodular foci in the lingula. Upper Abdomen: No acute abnormality. Musculoskeletal: No chest wall mass or suspicious bone lesions identified. IMPRESSION: 1. Filling defects in the left lower lobe bronchial tree with associated atelectasis, likely mucous plugging. 2. Small left pleural effusion with tunneled pleural catheter at the left lung base. 3. Mildly enlarged mediastinal lymph nodes, likely reactive. Aortic Atherosclerosis (ICD10-I70.0). Electronically Signed   By: Ulyses Jarred M.D.   On: 03/21/2021 01:05   MR BRAIN W WO CONTRAST  Result Date: 03/19/2021 CLINICAL DATA:  Staging of non-small cell lung carcinoma EXAM: MRI HEAD WITHOUT AND WITH CONTRAST TECHNIQUE: Multiplanar, multiecho pulse sequences of the brain  and surrounding structures were obtained without and with intravenous contrast. CONTRAST:  62mL GADAVIST GADOBUTROL 1 MMOL/ML IV SOLN COMPARISON:  07/06/2012 FINDINGS: Brain: No acute infarct, mass effect or extra-axial collection. No acute or chronic hemorrhage. Hyperintense T2-weighted signal is moderately widespread throughout the white matter. Generalized volume loss without a clear lobar predilection. Old left parietal infarct. The midline structures are normal. There is no abnormal contrast enhancement. Vascular: Major flow voids are preserved. Skull and upper cervical spine: Normal calvarium and skull base. Visualized upper cervical spine and soft tissues are normal. Sinuses/Orbits:No paranasal sinus fluid levels or advanced mucosal thickening. No mastoid or middle ear effusion. Normal orbits. IMPRESSION: 1. No intracranial metastatic disease. 2. Old left parietal infarct and  findings of chronic microvascular disease. Electronically Signed   By: Ulyses Jarred M.D.   On: 03/19/2021 22:06    Procedures Procedures   Medications Ordered in ED Medications  lactated ringers bolus 500 mL (0 mLs Intravenous Stopped 03/21/21 0031)    ED Course  I have reviewed the triage vital signs and the nursing notes.  Pertinent labs & imaging results that were available during my care of the patient were reviewed by me and considered in my medical decision making (see chart for details).    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  EKG without acute findings. CXR without pneumothorax, difficult to see pleurex catheter. Troponin negative. Doubt PE in setting of pt on anticoagulation, pain began in setting of drainage from pleurex and discontinued when drainage stopped. CT ordered to better evaluate tube positioning. Discussed with pulmonologist on call. Signed out to Dr. Sedonia Small with CT pending.   Final Clinical Impression(s) / ED Diagnoses Final diagnoses:  Chest pain, unspecified type  Acute pain associated with presence of chest tube    Rx / DC Orders ED Discharge Orders     None        Gareth Morgan, MD 03/21/21 [phone]

## 2021-03-20 NOTE — Telephone Encounter (Signed)
Renee returned call to office.  [name] stated Patient was d/c from hospital 03/17/21 and was told home health would follow up. Renee stated they knew Patient was approved, but has not heard anything from a home health agency. Per Hospital d/c instructions, [facility] would be calling to set up first home visit. Renee stated understanding.  Nothing further at this time.    Per hospital d/c 03/17/21.  Discharge   01-Home or Self Care    Follow-Ups: Follow up with [facility] ([place]); Home health.  They will call you to set up first visit.

## 2021-03-21 DIAGNOSIS — R0602 Shortness of breath: Secondary | ICD-10-CM | POA: Diagnosis not present

## 2021-03-21 NOTE — Discharge Instructions (Addendum)
You were evaluated in the Emergency Department and after careful evaluation, we did not find any emergent condition requiring admission or further testing in the hospital.  Your exam/testing today was overall reassuring.  Recommend follow-up with your regular doctors to discuss your symptoms.  Please return to the Emergency Department if you experience any worsening of your condition.  Thank you for allowing [place] to be a part of your care.

## 2021-03-21 NOTE — ED Provider Notes (Signed)
  Provider Note MRN:  [id number]  Arrival date & time: 03/21/21    ED Course and Medical Decision Making  Assumed care from Dr. Billy Fischer at shift change.  History of malignant pleural effusion, had some pain during suction today, awaiting CT to evaluate chest tube, patient is doing much better without pain, anticipating discharge with pulmonology follow-up.  CT is overall reassuring, does not appear to be any issue with the catheter.  Lung seems fully expanded.  There is some mucus plugging seen on CT of unclear clinical significance.  Patient has no fever, no change in cough, clinically does not have an acute pneumonia.  Has not had any pain here in the emergency department, continues to look and feel well, appropriate for discharge.  Procedures  Final Clinical Impressions(s) / ED Diagnoses     ICD-10-CM   1. Chest pain, unspecified type  R07.9     2. Acute pain associated with presence of chest tube  V25.366Y       ED Discharge Orders     None         Discharge Instructions      You were evaluated in the Emergency Department and after careful evaluation, we did not find any emergent condition requiring admission or further testing in the hospital.  Your exam/testing today was overall reassuring.  Recommend follow-up with your regular doctors to discuss your symptoms.  Please return to the Emergency Department if you experience any worsening of your condition.  Thank you for allowing [place] to be a part of your care.       Barth Kirks. Sedonia Small, [place] [email or web address]    Maudie Flakes, MD 03/21/21 [phone]

## 2021-03-23 ENCOUNTER — Encounter: Payer: Self-pay | Admitting: Urology

## 2021-03-23 ENCOUNTER — Encounter: Payer: Self-pay | Admitting: *Deleted

## 2021-03-23 ENCOUNTER — Other Ambulatory Visit: Payer: Self-pay

## 2021-03-23 ENCOUNTER — Telehealth: Payer: Self-pay | Admitting: Pulmonary Disease

## 2021-03-23 ENCOUNTER — Ambulatory Visit
Admission: RE | Admit: 2021-03-23 | Discharge: 2021-03-23 | Disposition: A | Discharge status: Home / Self Care | Payer: Medicare PPO | Source: Ambulatory Visit | Attending: Radiation Oncology | Admitting: Radiation Oncology

## 2021-03-23 DIAGNOSIS — C61 Malignant neoplasm of prostate: Secondary | ICD-10-CM | POA: Diagnosis not present

## 2021-03-23 DIAGNOSIS — Z510 Encounter for antineoplastic radiation therapy: Secondary | ICD-10-CM | POA: Diagnosis not present

## 2021-03-23 DIAGNOSIS — C3492 Malignant neoplasm of unspecified part of left bronchus or lung: Secondary | ICD-10-CM

## 2021-03-23 DIAGNOSIS — J910 Malignant pleural effusion: Secondary | ICD-10-CM | POA: Diagnosis not present

## 2021-03-23 DIAGNOSIS — Z87891 Personal history of nicotine dependence: Secondary | ICD-10-CM | POA: Diagnosis not present

## 2021-03-23 DIAGNOSIS — I10 Essential (primary) hypertension: Secondary | ICD-10-CM | POA: Diagnosis not present

## 2021-03-23 DIAGNOSIS — C3432 Malignant neoplasm of lower lobe, left bronchus or lung: Secondary | ICD-10-CM | POA: Diagnosis not present

## 2021-03-23 DIAGNOSIS — Z48813 Encounter for surgical aftercare following surgery on the respiratory system: Secondary | ICD-10-CM | POA: Diagnosis not present

## 2021-03-23 DIAGNOSIS — I480 Paroxysmal atrial fibrillation: Secondary | ICD-10-CM | POA: Diagnosis not present

## 2021-03-23 DIAGNOSIS — D630 Anemia in neoplastic disease: Secondary | ICD-10-CM | POA: Diagnosis not present

## 2021-03-23 DIAGNOSIS — I2510 Atherosclerotic heart disease of native coronary artery without angina pectoris: Secondary | ICD-10-CM | POA: Diagnosis not present

## 2021-03-23 DIAGNOSIS — Z4801 Encounter for change or removal of surgical wound dressing: Secondary | ICD-10-CM | POA: Diagnosis not present

## 2021-03-23 NOTE — Telephone Encounter (Signed)
Received a call from [place] at [facility]. He stated that the home health nurse would be visiting the patient today to check on his pleurx catheter but the order did not contain any instructions on the frequency of the drainings, how many CCs, etc.   Dr. Valeta Harms is listed as off today but I sent him a text due to the nature of the call. Per Dr. Valeta Harms, he can do daily drainings and can pull off as much fluid as possible until it becomes painful.   Called Cory back with the information. He verbalized understanding.   Nothing further needed at time of call.

## 2021-03-23 NOTE — Progress Notes (Signed)
I followed up on Corey [name]'s schedule. I did not see his infusion appt for this week so I reached out to the scheduling team for an update.

## 2021-03-24 ENCOUNTER — Encounter (HOSPITAL_COMMUNITY)
Admission: RE | Admit: 2021-03-24 | Discharge: 2021-03-24 | Disposition: A | Discharge status: Home / Self Care | Payer: Medicare PPO | Source: Ambulatory Visit | Attending: Internal Medicine | Admitting: Internal Medicine

## 2021-03-24 ENCOUNTER — Encounter: Payer: Self-pay | Admitting: Allergy

## 2021-03-24 ENCOUNTER — Ambulatory Visit (INDEPENDENT_AMBULATORY_CARE_PROVIDER_SITE_OTHER): Payer: Medicare PPO | Admitting: Allergy

## 2021-03-24 VITALS — BP 120/72 | HR 93 | Temp 97.0°F | Resp 16

## 2021-03-24 DIAGNOSIS — J3089 Other allergic rhinitis: Secondary | ICD-10-CM | POA: Diagnosis not present

## 2021-03-24 DIAGNOSIS — C3490 Malignant neoplasm of unspecified part of unspecified bronchus or lung: Secondary | ICD-10-CM | POA: Diagnosis not present

## 2021-03-24 DIAGNOSIS — R12 Heartburn: Secondary | ICD-10-CM

## 2021-03-24 DIAGNOSIS — R059 Cough, unspecified: Secondary | ICD-10-CM

## 2021-03-24 LAB — GLUCOSE, CAPILLARY: Glucose-Capillary: 96 mg/dL (ref 70–99)

## 2021-03-24 MED ORDER — CARBINOXAMINE MALEATE 6 MG PO TABS: 1.0000 | ORAL_TABLET | Freq: Two times a day (BID) | ORAL | 4 refills | Status: DC | PRN

## 2021-03-24 MED ORDER — OMEPRAZOLE 20 MG PO CPDR: 20.0000 mg | DELAYED_RELEASE_CAPSULE | Freq: Every day | ORAL | 5 refills | Status: DC

## 2021-03-24 MED ORDER — IPRATROPIUM BROMIDE 0.03 % NA SOLN: 1.0000 | Freq: Two times a day (BID) | NASAL | 5 refills | Status: DC

## 2021-03-24 MED ORDER — FLUDEOXYGLUCOSE F - 18 (FDG) INJECTION
11.2700 | Freq: Once | INTRAVENOUS | Status: AC | PRN
  Administered 2021-03-24: 11.27 via INTRAVENOUS

## 2021-03-24 NOTE — Assessment & Plan Note (Addendum)
Past history - Episodes of coughing from postnasal drip, wheezing, shortness of breath and chest tightness for 20+ years. 2021 chest x-ray was unremarkable.  Patient also has significant cardiac history with paroxysmal atrial fibrillation, coronary artery disease and hypertension.  No longer on lisinopril. 2021 spirometry was normal with no improvement in FEV1 post bronchodilator treatment.  Clinically feeling improved. Interim history - diagnosed with lung cancer s/p radiation, pleural catheter and starting chemo. Not sure if Flovent helping. Still has episodes of coughing/wheezing and using albuterol with good benefit.   Discussed with patient that his current symptoms and given recent events he has multiple factors that may be contributing to his symptoms. . Stop Flovent as ineffective.  . If you notice worsening symptoms let us know. . May use albuterol rescue inhaler 2 puffs every 4 to 6 hours as needed for shortness of breath, chest tightness, coughing, and wheezing. May use albuterol rescue inhaler 2 puffs 5 to 15 minutes prior to strenuous physical activities. Monitor frequency of use.

## 2021-03-24 NOTE — Assessment & Plan Note (Signed)
.   Increase omeprazole to 20mg  daily in the morning - nothing to eat/drink for 30 minutes afterwards.

## 2021-03-24 NOTE — Assessment & Plan Note (Signed)
Past history - Perennial rhinoconjunctivitis symptoms for 20+ years with worsening in the spring and fall.  Other triggers include change in temperature.  Tried Flonase, ipratropium and Xyzal with some benefit.  Recent ENT evaluation showed deviated septum and dry nasal passages.  Patient has obstructive sleep apnea and wears a CPAP machine at night. 2021 skin testing showed: Positive to tree pollen only. Interim history - still has significant drainage. Most likely non-allergic in nature.   Continue environmental control measures as below for the tree pollen which blooms in the spring.  . May use Flonase (fluticasone) nasal spray 2 sprays per nostril in the morning for nasal congestion. . Stop azelastine nasal spray.  . Use Atrovent (ipratropium) 0.03% 1-2 sprays per nostril twice a day as needed for runny nose/drainage.  Nasal saline spray (i.e., Simply Saline) or nasal saline lavage (i.e., NeilMed) is recommended as needed and prior to medicated nasal sprays.  Stop levocetirizine.  Start carbinoxamine 6mg  twice a day - samples given. See if helps with the drainage.   Don't use with Coricidin.

## 2021-03-24 NOTE — Progress Notes (Signed)
Follow Up Note  RE: Corey [name] MRN: [id number] DOB: 05-Apr-1946 Date of Office Visit: 03/24/2021  Referring provider: [name], MD Primary care provider: [name], MD  Chief Complaint: Sinus Problem (Post nasal drip is bad nothing works but Coricidin HBP Cold N Cough)  History of Present Illness: I had the pleasure of seeing Corey [name] for a follow up visit at the [facility] on 03/24/2021. He is a 75 y.o. male, who is being followed for allergic rhinitis and reactive airway disease. His previous allergy office visit was on 11/20/2020 with Dr. Maudie Mercury. Today is a regular follow up visit.  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  Has been having issues with his breathing due to pleural effusion and he has a pleural catheter - patient follows with pulmonologist.  He underwent radiation and will be starting chemotherapy next.   Currently using Flovent 158mcg 2 puffs twice a day and unsure if it's helping. Using albuterol as needed with good benefit in the afternoons for wheezing - not using it daily.   He does have some rattling and drainage. Coughing up clear phlegm.  He does have some PND.   Currently using azelastine with no benefit - he thinks the ipratropium worked better.  He does use Flonase 2 sprays per nostril once a day. No nosebleeds.  Still wearing CPAP at night.   Taking Xyzal in the afternoon with no benefit.  He has been taking coricidin which seems to work better.    He also seems to have a flare in his reflux/gerd symptoms.   Assessment and Plan: Corey [name] is a 75 y.o. male with: Coughing Past history - Episodes of coughing from postnasal drip, wheezing, shortness of breath and chest tightness for 20+ years. 2021 chest x-ray was unremarkable.  Patient also has significant cardiac history with paroxysmal atrial fibrillation, coronary artery disease and hypertension.  No  longer on lisinopril. 2021 spirometry was normal with no improvement in FEV1 post bronchodilator treatment.  Clinically feeling improved. Interim history - diagnosed with lung cancer s/p radiation, pleural catheter and starting chemo. Not sure if Flovent helping. Still has episodes of coughing/wheezing and using albuterol with good benefit.  Discussed with patient that his current symptoms and given recent events he has multiple factors that may be contributing to his symptoms. Stop Flovent as ineffective.  If you notice worsening symptoms let us know. May use albuterol rescue inhaler 2 puffs every 4 to 6 hours as needed for shortness of breath, chest tightness, coughing, and wheezing. May use albuterol rescue inhaler 2 puffs 5 to 15 minutes prior to strenuous physical activities. Monitor frequency of use.   Allergic rhinitis with non-allergic component Past history - Perennial rhinoconjunctivitis symptoms for 20+ years with worsening in the spring and fall.  Other triggers include change in temperature.  Tried Flonase, ipratropium and Xyzal with some benefit.  Recent ENT evaluation showed deviated septum and dry nasal passages.  Patient has obstructive sleep apnea and wears a CPAP machine at night. 2021 skin testing showed: Positive to tree pollen only. Interim history - still has significant drainage. Most likely non-allergic in nature.  Continue environmental control measures as below for the tree pollen which blooms in the spring.  May use Flonase (fluticasone) nasal spray 2 sprays per nostril in the morning for nasal congestion. Stop azelastine nasal spray.  Use Atrovent (ipratropium) 0.03% 1-2 sprays  per nostril twice a day as needed for runny nose/drainage. Nasal saline spray (i.e., Simply Saline) or nasal saline lavage (i.e., NeilMed) is recommended as needed and prior to medicated nasal sprays. Stop levocetirizine. Start carbinoxamine 6mg  twice a day - samples given. See if helps with the  drainage.  Don't use with Coricidin.  Heartburn Increase omeprazole to 20mg  daily in the morning - nothing to eat/drink for 30 minutes afterwards.  Return in about 4 months (around 07/24/2021). Continue to follow up with specialists.   Meds ordered this encounter  Medications   ipratropium (ATROVENT) 0.03 % nasal spray    Sig: Place 1-2 sprays into both nostrils every 12 (twelve) hours. For drainage    Dispense:  30 mL    Refill:  5   omeprazole (PRILOSEC) 20 MG capsule    Sig: Take 1 capsule (20 mg total) by mouth daily.    Dispense:  30 capsule    Refill:  5   Carbinoxamine Maleate 6 MG TABS    Sig: Take 1 tablet by mouth 2 (two) times daily as needed (drainage).    Dispense:  60 tablet    Refill:  4    Lab Orders  No laboratory test(s) ordered today    Diagnostics: None.  Medication List:  Current Outpatient Medications  Medication Sig Dispense Refill   acetaminophen (TYLENOL 8 HOUR ARTHRITIS PAIN) 650 MG CR tablet Take 1,300 mg by mouth as needed for pain (pain).      albuterol (VENTOLIN HFA) 108 (90 Base) MCG/ACT inhaler Inhale 2 puffs into the lungs every 4 hours as needed 6.7 each 0   atorvastatin (LIPITOR) 40 MG tablet Take 1 tablet (40 mg total) by mouth daily. 90 tablet 3   Carbinoxamine Maleate 6 MG TABS Take 1 tablet by mouth 2 (two) times daily as needed (drainage). 60 tablet 4   clindamycin (CLEOCIN) 300 MG capsule Take 300 mg by mouth See admin instructions. Uses for dental procedures     diclofenac Sodium (VOLTAREN) 1 % GEL Apply 4 g topically 4 (four) times daily. 100 g 0   dicyclomine (BENTYL) 20 MG tablet Take 20 mg by mouth as needed for spasms.     diltiazem (CARDIZEM) 30 MG tablet Take 1 tablet (30 mg total) by mouth 4 (four) times daily as needed. 60 tablet 6   dofetilide (TIKOSYN) 500 MCG capsule Take 1 capsule (500 mcg total) by mouth 2 (two) times daily. Please keep upcoming appointment in February for future refills. Thank you 180 capsule 3    feeding supplement (ENSURE ENLIVE / ENSURE PLUS) LIQD Take 237 mLs by mouth 2 (two) times daily between meals. 60 mL 0   fenofibrate 160 MG tablet TAKE 1 TABLET DAILY. PLEASE KEEP UPCOMING APPT IN MARCH WITH DR. Acie Fredrickson BEFORE ANYMORE REFILLS. 90 tablet 3   fluticasone (FLONASE) 50 MCG/ACT nasal spray Place 2 sprays into both nostrils daily. 84.6 mL 5   folic acid (FOLVITE) 1 MG tablet Take 1 tablet (1 mg total) by mouth daily. 30 tablet 2   ipratropium (ATROVENT) 0.03 % nasal spray Place 1-2 sprays into both nostrils every 12 (twelve) hours. For drainage 30 mL 5   loperamide (IMODIUM) 2 MG capsule Take 2 mg by mouth as needed for diarrhea or loose stools.     magnesium oxide (MAG-OX) 400 MG tablet TAKE 1 TABLET BY MOUTH EVERY DAY 90 tablet 2   metoprolol succinate (TOPROL XL) 25 MG 24 hr tablet Take 1 tablet (25 mg  total) by mouth daily. 90 tablet 3   Multiple Vitamins-Minerals (MULTIVITAMINS THER. W/MINERALS) TABS Take 1 tablet by mouth daily.     nitroGLYCERIN (NITROSTAT) 0.4 MG SL tablet Place 1 tablet (0.4 mg total) under the tongue every 5 (five) minutes as needed for chest pain. 25 tablet 5   omeprazole (PRILOSEC) 20 MG capsule Take 1 capsule (20 mg total) by mouth daily. 30 capsule 5   ondansetron (ZOFRAN) 8 MG tablet Take 1 tablet (8 mg total) by mouth every 8 (eight) hours as needed for nausea or vomiting. Starting 3 days after chemotherapy 30 tablet 2   potassium chloride (KLOR-CON) 10 MEQ tablet Take 1 tablet (10 mEq total) by mouth daily. 90 tablet 2   predniSONE (DELTASONE) 5 MG tablet Take 1 tablet (5 mg total) by mouth daily with breakfast for 15 days. 15 tablet 5   Saccharomyces boulardii (PROBIOTIC) 250 MG CAPS Take 1 capsule by mouth daily.     tamsulosin (FLOMAX) 0.4 MG CAPS capsule Take 0.4 mg by mouth daily.     topiramate (TOPAMAX) 50 MG tablet Take 2 tablets (100 mg total) by mouth 2 (two) times daily. 360 tablet 3   traMADol (ULTRAM) 50 MG tablet Take 1 tablet (50 mg total)  by mouth every 6 (six) hours as needed for moderate pain. 30 tablet 0   traZODone (DESYREL) 50 MG tablet Take 0.5 tablets (25 mg total) by mouth at bedtime as needed for sleep. 45 tablet 3   Vitamin D, Ergocalciferol, (DRISDOL) 1.25 MG (50000 UNIT) CAPS capsule TAKE 1 CAPSULE BY MOUTH ONE TIME PER WEEK 12 capsule 1   XARELTO 20 MG TABS tablet TAKE 1 TABLET BY MOUTH EVERY DAY 90 tablet 2   leuprolide, 6 Month, (ELIGARD) 45 MG injection Inject 45 mg into the skin every 6 (six) months. (Patient not taking: No sig reported)     No current facility-administered medications for this visit.   Allergies: Allergies  Allergen Reactions   Amoxicillin Swelling   Fosinopril Other (See Comments)   Hydromorphone Nausea Only, Other (See Comments) and Nausea And Vomiting   Ampicillin Rash   Monopril [Fosinopril Sodium] Other (See Comments)    Muscle aches & pain   Other Other (See Comments)   Rosuvastatin    I reviewed his past medical history, social history, family history, and environmental history and no significant changes have been reported from his previous visit.  Review of Systems  Constitutional:  Negative for appetite change, chills, fever and unexpected weight change.  HENT:  Positive for congestion, postnasal drip and rhinorrhea.   Respiratory:  Positive for cough and wheezing. Negative for chest tightness and shortness of breath.   Cardiovascular:  Negative for chest pain.  Gastrointestinal:  Negative for abdominal pain.  Genitourinary:  Negative for difficulty urinating.  Skin:  Negative for rash.  Allergic/Immunologic: Positive for environmental allergies.  Neurological:  Negative for headaches.  Objective: BP 120/72 (BP Location: Left Arm, Patient Position: Sitting, Cuff Size: Normal)   Pulse 93   Temp (!) 97 F (36.1 C) (Temporal)   Resp 16   SpO2 97%  There is no height or weight on file to calculate BMI. Physical Exam Vitals and nursing note reviewed.  Constitutional:       Appearance: Normal appearance. He is well-developed.  HENT:     Head: Normocephalic and atraumatic.     Right Ear: External ear normal.     Left Ear: External ear normal.     Nose:  Nose normal.     Mouth/Throat:     Mouth: Mucous membranes are moist.     Pharynx: Oropharynx is clear.  Eyes:     Conjunctiva/sclera: Conjunctivae normal.  Cardiovascular:     Rate and Rhythm: Normal rate and regular rhythm.     Heart sounds: Normal heart sounds. No murmur heard.   No friction rub. No gallop.  Pulmonary:     Effort: Pulmonary effort is normal.     Breath sounds: Normal breath sounds. No wheezing, rhonchi or rales.  Abdominal:     Palpations: Abdomen is soft.  Musculoskeletal:     Cervical back: Neck supple.  Skin:    General: Skin is warm.     Findings: No rash.  Neurological:     Mental Status: He is alert and oriented to person, place, and time.  Psychiatric:        Behavior: Behavior normal.  Previous notes and tests were reviewed. The plan was reviewed with the patient/family, and all questions/concerned were addressed.  It was my pleasure to see Corey [name] today and participate in his care. Please feel free to contact me with any questions or concerns.  Sincerely,  Rexene Alberts, DO Allergy & Immunology  [facility] office: [place] office: [phone]

## 2021-03-24 NOTE — Progress Notes (Signed)
Pharmacist Chemotherapy Monitoring - Initial Assessment    Anticipated start date: 03/31/21  The following has been reviewed per standard work regarding the patient's treatment regimen: The patient's diagnosis, treatment plan and drug doses, and organ/hematologic function Lab orders and baseline tests specific to treatment regimen  The treatment plan start date, drug sequencing, and pre-medications Prior authorization status  Patient's documented medication list, including drug-drug interaction screen and prescriptions for anti-emetics and supportive care specific to the treatment regimen The drug concentrations, fluid compatibility, administration routes, and timing of the medications to be used The patient's access for treatment and lifetime cumulative dose history, if applicable  The patient's medication allergies and previous infusion related reactions, if applicable   Changes made to treatment plan:  Drug interaction screening performed.  Pt needs alt rescue home antiemetic - contacted provider.  Follow up needed:  Pending authorization for treatment  Pending home antiemetic for rescue agent.   Tora Kindred, [facility], 03/24/2021  9:57 AM

## 2021-03-24 NOTE — Patient Instructions (Addendum)
Rhinitis: 2021 skin testing showed positive to tree pollen.  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  Continue environmental control measures as below for the tree pollen which blooms in the spring.  May use Flonase (fluticasone) nasal spray 2 sprays per nostril in the morning for nasal congestion. Stop azelastine nasal spray.  Use Atrovent (ipratropium) 0.03% 1-2 sprays per nostril twice a day as needed for runny nose/drainage.  Nasal saline spray (i.e., Simply Saline) or nasal saline lavage (i.e., NeilMed) is recommended as needed and prior to medicated nasal sprays. Stop levocetirizine. Start carbinoxamine 6mg  twice a day - samples given. See if helps with the drainage.  Don't use with Coricidin.  Breathing:  Daily controller medication(s): stop Flovent.  If you notice worsening symptoms let us know.  May use albuterol rescue inhaler 2 puffs every 4 to 6 hours as needed for shortness of breath, chest tightness, coughing, and wheezing. May use albuterol rescue inhaler 2 puffs 5 to 15 minutes prior to strenuous physical activities. Monitor frequency of use.   Reflux:  Increase omeprazole to 20mg  daily in the morning - nothing to eat/drink for 30 minutes afterwards.  Follow up in 4-5 months after done with chemotherapy - sooner if needed.   Reducing Pollen Exposure Pollen seasons: trees (spring), grass (summer) and ragweed/weeds (fall). Keep windows closed in your home and car to lower pollen exposure.  Install air conditioning in the bedroom and throughout the house if possible.  Avoid going out in dry windy days - especially early morning. Pollen counts are highest between 5 - 10 AM and on dry, hot and windy days.  Save outside activities for late afternoon or after a heavy rain, when pollen levels are lower.  Avoid mowing of grass if you have grass pollen allergy. Be aware that pollen can also be transported indoors on people and pets.  Dry your clothes in an automatic dryer  rather than hanging them outside where they might collect pollen.  Rinse hair and eyes before bedtime.    Heartburn Heartburn is a type of pain or discomfort that can happen in the throat or chest. It is often described as a burning pain. It may also cause a bad, acid-like taste in the mouth. Heartburn may feel worse when you lie down or bend over. It may be worse at night. It may be caused by stomach contents that move back up (reflux) into the tube that connects the mouth with the stomach (esophagus). Follow these instructions at home: Eating and drinking  Avoid certain foods and drinks as told by your doctor. This may include: Coffee and tea (with or without caffeine). Drinks that have alcohol. Energy drinks and sports drinks. Carbonated drinks or sodas. Chocolate and cocoa. Peppermint and mint flavorings. Garlic and onions. Horseradish. Spicy and acidic foods, such as: Peppers. Chili powder and curry powder. Vinegar. Hot sauces and BBQ sauce. Citrus fruit juices and citrus fruits, such as: Oranges. Lemons. Limes. Tomato-based foods, such as: Red sauce and pizza with red sauce. Chili. Salsa. Fried and fatty foods, such as: Donuts. [place] fries and potato chips. High-fat dressings. High-fat meats, such as: Hot dogs and sausage. Rib eye steak. Ham and bacon. High-fat dairy items, such as: Whole milk. Butter. Cream cheese. Eat small meals often. Avoid eating large meals. Avoid drinking large amounts of liquid with your meals. Avoid eating meals during the 2-3 hours before bedtime. Avoid lying down right after you eat. Do not exercise right after you eat. Lifestyle  If you are overweight, lose an amount of weight that is healthy for you. Ask your doctor about a safe weight loss goal. Do not use any products that contain nicotine or tobacco, including cigarettes, e-cigarettes, and chewing tobacco. These can make your symptoms worse. If you need help quitting,  ask your doctor. Wear loose clothes. Do not wear anything tight around your waist. Raise (elevate) the head of your bed about 6 inches (15 cm) when you sleep. Try to lower your stress. If you need help doing this, ask your doctor. General instructions Pay attention to any changes in your symptoms. Take over-the-counter and prescription medicines only as told by your doctor. Do not take aspirin, ibuprofen, or other NSAIDs unless your doctor says it is okay. Stop medicines only as told by your doctor. Keep all follow-up visits as told by your doctor. This is important. Contact a doctor if: You have new symptoms. You lose weight and you do not know why it is happening. You have trouble swallowing, or it hurts to swallow. You have wheezing or a cough that keeps happening. Your symptoms do not get better with treatment. You have heartburn often for more than 2 weeks. Get help right away if: You have pain in your arms, neck, jaw, teeth, or back. You feel sweaty, dizzy, or light-headed. You have chest pain or shortness of breath. You throw up (vomit) and your throw up looks like blood or coffee grounds. Your poop (stool) is bloody or black. These symptoms may represent a serious problem that is an emergency. Do not wait to see if the symptoms will go away. Get medical help right away. Call your local emergency services (911 in the [place] Do not drive yourself to the hospital. Summary Heartburn is a type of pain that can happen in the throat or chest. It can feel like a burning pain. It may also cause a bad, acid-like taste in the mouth. You may need to avoid certain foods and drinks to help your symptoms. Ask your doctor what foods and drinks you should avoid. Take over-the-counter and prescription medicines only as told by your doctor. Do not take aspirin, ibuprofen, or other NSAIDs unless your doctor told you to do so. Contact your doctor if your symptoms do not get better or they get  worse. This information is not intended to replace advice given to you by your health care provider. Make sure you discuss any questions you have with your health care provider. Document Revised: 02/20/2018 Document Reviewed: 02/20/2018 Elsevier Patient Education  [place].

## 2021-03-25 ENCOUNTER — Other Ambulatory Visit: Payer: Self-pay | Admitting: *Deleted

## 2021-03-25 ENCOUNTER — Other Ambulatory Visit: Payer: Self-pay

## 2021-03-25 ENCOUNTER — Inpatient Hospital Stay: Payer: Medicare PPO

## 2021-03-25 MED ORDER — SUCRALFATE 1 GM/10ML PO SUSP: 1.0000 g | Freq: Four times a day (QID) | ORAL | 1 refills | Status: DC

## 2021-03-25 MED ORDER — PROMETHAZINE HCL 25 MG PO TABS: 25.0000 mg | ORAL_TABLET | Freq: Four times a day (QID) | ORAL | 1 refills | Status: DC | PRN

## 2021-03-25 NOTE — Progress Notes (Signed)
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     Molecular Studies by [employer]: [id number] 0.5%   Olaparib Yes [id number] 50.9%   Olaparib Yes [id number] 0.8% None     PDL1: 30%  PRIOR THERAPY: 1) Palliative radiotherapy to the obstructive lung mass in the left lung under the care of Dr. MTammi Klippel Last treatment 03/23/21  CURRENT THERAPY: Palliative systemic chemotherapy with carboplatin AUC 5, Alimta 500 mg per metered squared, Keytruda 200 mg IV every 3 weeks.  First dose expected on 03/31/21  INTERVAL HISTORY: Corey [id number].[id number]. male returns to the clinic today for a follow up visit accompanied by his wife. The patient was recently diagnosed with stage IV lung cancer. He is scheduled to undergo his first cycle of systemic chemotherapy today. He completed palliative radiation to the obstructive lung mass under the care of Dr. MTammi Klippelon 03/23/21.   Since he was seen last time, he completed the staging workup with his PET scan and brain MRI. His MRI was negative for metastatic disease to the brain. The patient also has a pleurex for his malignant pleural effusions. He presented to the ER on 03/20/21 due to pain in the chest after draining fluid from his catheter. This was last drained on Friday 03/27/21 which yielded 50 ml of fluid. They are draining this approximately 2x per week.   Overall, the patient is feeling well except for fatigue. He takes several medications which cause drowsiness/fatigue such as Topamax and trazodone. He also has anemia. The patient states he has had these medications for  a long time prior to his worsening fatigue. His TSH is also WNL today.    He denies any hemoptysis or chest pain. He has stable dyspnea on exertion and cough. He denies any fevers or chills. He reports some night sweats around the collar of his shirt at night time. He denies any weight loss.  He denies any vomiting, or diarrhea. He has some nausea associated with reflux. He has been taking his Carafate after eating as opposed to before eating. He takes Prilosec for reflux. He has mild baseline constipation for which he uses miralax. He has baseline anemia and denies any bleeding or bruising except the pleural fluid is reportedly has blood in it. The patient is here today for evaluation before starting cycle #1.   MEDICAL HISTORY: Past Medical History:  Diagnosis Date   Anemia    Anxiety    Arthritis    "back, right knee, hands, ankles, neck" (05/31/2016)   Carotid artery disease ([place] 08/30/2016   Carotid [place]19: R 1-39, L 40-59 // Carotid [place]/2020: R 1-39; L 40-59 // Carotid [place]21: R 1-39; L 40-59>> repeat 1 year    Chronic lower back pain    Coronary atherosclerosis of native coronary artery    [facility], otherwise mild nonobstructive disease. EF normal.   Diverticulitis    Dyslipidemia    Dyspnea    Essential hypertension, benign    Family history of breast cancer    Family history of lung cancer    Family history of prostate cancer  GERD (gastroesophageal reflux disease)    Lumbar radiculopathy, chronic 02/04/2015   Right L5   Obesity    OSA on CPAP    uses cpap   Paroxysmal atrial fibrillation ([place])    a. Discovered after stroke.   Pneumonia 01/1996   PONV (postoperative nausea and vomiting)    Prostate CA ([place])    Recurrent upper respiratory infection (URI)    Stroke ([place]) 07/2012   no deficits   Visit for monitoring Tikosyn therapy 09/18/2019    ALLERGIES:  is allergic to amoxicillin, fosinopril, hydromorphone, ampicillin, monopril [fosinopril  sodium], other, and rosuvastatin.  MEDICATIONS:  Current Outpatient Medications  Medication Sig Dispense Refill   acetaminophen (TYLENOL 8 HOUR ARTHRITIS PAIN) 650 MG CR tablet Take 1,300 mg by mouth as needed for pain (pain).      albuterol (VENTOLIN HFA) 108 (90 Base) MCG/ACT inhaler Inhale 2 puffs into the lungs every 4 hours as needed 6.7 each 0   atorvastatin (LIPITOR) 40 MG tablet Take 1 tablet (40 mg total) by mouth daily. 90 tablet 3   Carbinoxamine Maleate 6 MG TABS Take 1 tablet by mouth 2 (two) times daily as needed (drainage). 60 tablet 4   clindamycin (CLEOCIN) 300 MG capsule Take 300 mg by mouth See admin instructions. Uses for dental procedures     diclofenac Sodium (VOLTAREN) 1 % GEL Apply 4 g topically 4 (four) times daily. 100 g 0   dicyclomine (BENTYL) 20 MG tablet Take 20 mg by mouth as needed for spasms.     diltiazem (CARDIZEM) 30 MG tablet Take 1 tablet (30 mg total) by mouth 4 (four) times daily as needed. 60 tablet 6   dofetilide (TIKOSYN) 500 MCG capsule Take 1 capsule (500 mcg total) by mouth 2 (two) times daily. Please keep upcoming appointment in February for future refills. Thank you 180 capsule 3   fenofibrate 160 MG tablet TAKE 1 TABLET DAILY. PLEASE KEEP UPCOMING APPT IN MARCH WITH DR. Acie Fredrickson BEFORE ANYMORE REFILLS. 90 tablet 3   fluticasone (FLONASE) 50 MCG/ACT nasal spray Place 2 sprays into both nostrils daily. 70.7 mL 5   folic acid (FOLVITE) 1 MG tablet Take 1 tablet (1 mg total) by mouth daily. 30 tablet 2   ipratropium (ATROVENT) 0.03 % nasal spray Place 1-2 sprays into both nostrils every 12 (twelve) hours. For drainage 30 mL 5   leuprolide, 6 Month, (ELIGARD) 45 MG injection Inject 45 mg into the skin every 6 (six) months.     loperamide (IMODIUM) 2 MG capsule Take 2 mg by mouth as needed for diarrhea or loose stools.     magnesium oxide (MAG-OX) 400 MG tablet TAKE 1 TABLET BY MOUTH EVERY DAY 90 tablet 2   metoprolol succinate (TOPROL XL) 25 MG 24 hr  tablet Take 1 tablet (25 mg total) by mouth daily. 90 tablet 3   Multiple Vitamins-Minerals (MULTIVITAMINS THER. W/MINERALS) TABS Take 1 tablet by mouth daily.     nitroGLYCERIN (NITROSTAT) 0.4 MG SL tablet Place 1 tablet (0.4 mg total) under the tongue every 5 (five) minutes as needed for chest pain. 25 tablet 5   omeprazole (PRILOSEC) 20 MG capsule Take 1 capsule (20 mg total) by mouth daily. 30 capsule 5   ondansetron (ZOFRAN) 8 MG tablet Take 1 tablet (8 mg total) by mouth every 8 (eight) hours as needed for nausea or vomiting. Starting 3 days after chemotherapy 30 tablet 2   potassium chloride (KLOR-CON) 10 MEQ tablet Take 1 tablet (10 mEq  total) by mouth daily. 90 tablet 2   promethazine (PHENERGAN) 25 MG tablet Take 1 tablet (25 mg total) by mouth every 6 (six) hours as needed for nausea or vomiting. 30 tablet 1   Saccharomyces boulardii (PROBIOTIC) 250 MG CAPS Take 1 capsule by mouth daily.     sucralfate (CARAFATE) 1 GM/10ML suspension Take 10 mLs (1 g total) by mouth 4 (four) times daily. Before meals 420 mL 1   tamsulosin (FLOMAX) 0.4 MG CAPS capsule Take 0.4 mg by mouth daily.     topiramate (TOPAMAX) 50 MG tablet Take 2 tablets (100 mg total) by mouth 2 (two) times daily. 360 tablet 3   traMADol (ULTRAM) 50 MG tablet Take 1 tablet (50 mg total) by mouth every 6 (six) hours as needed for moderate pain. 30 tablet 0   traZODone (DESYREL) 50 MG tablet Take 0.5 tablets (25 mg total) by mouth at bedtime as needed for sleep. 45 tablet 3   Vitamin D, Ergocalciferol, (DRISDOL) 1.25 MG (50000 UNIT) CAPS capsule TAKE 1 CAPSULE BY MOUTH ONE TIME PER WEEK 12 capsule 1   XARELTO 20 MG TABS tablet TAKE 1 TABLET BY MOUTH EVERY DAY 90 tablet 2   No current facility-administered medications for this visit.    SURGICAL HISTORY:  Past Surgical History:  Procedure Laterality Date   ADENOIDECTOMY     ANTERIOR CERVICAL DECOMP/DISCECTOMY FUSION  07/2001; 10/2002   "C5-6; C6-7; redo"   BACK SURGERY      CARPAL TUNNEL RELEASE Left 10/2015   CHEST TUBE INSERTION Left 03/17/2021   Procedure: INSERTION PLEURAL DRAINAGE CATHETER;  Surgeon: Garner Nash, DO;  Location: [facility];  Service: Pulmonary;  Laterality: Left;  Indwelling Tunneled Pleural catheter (PLEUREX)    COLONOSCOPY W/ POLYPECTOMY  02/2014   CORONARY ANGIOPLASTY WITH STENT PLACEMENT  05/2003; 12/2005   "mid RCA; mid RCA"   FINE NEEDLE ASPIRATION  02/26/2021   Procedure: FINE NEEDLE ASPIRATION (FNA) LINEAR;  Surgeon: Candee Furbish, MD;  Location[facility] END[facility]COPY;  Service: Pulmonary;;   HAND SURGERY  02/2019   LEFT HAND   implantable loop recorder placement  02/27/2020   Medtronic Reveal [place] model [id number]  implantable loop recorder implanted by Dr Rayann Heman for afib management and evaluation of presyncope   JOINT REPLACEMENT     KNEE ARTHROSCOPY Left 10/2005   KNEE ARTHROSCOPY W/ PARTIAL MEDIAL MENISCECTOMY Left 09/2005   LUMBAR LAMINECTOMY/DECOMPRESSION MICRODISCECTOMY  03/2005   "L4-5"   POSTERIOR LUMBAR FUSION  10/2003   L5-S1; "plates, screws"   SHOULDER ARTHROSCOPY Right 08/2011   Debridement of labrum, arthroscopic distal clavicle excision   SHOULDER OPEN ROTATOR CUFF REPAIR Left 07/2014   TEE WITHOUT CARDIOVERSION  07/07/2012   Procedure: TRANSESOPHAGEAL ECHOCARDIOGRAM (TEE);  Surgeon: Fay Records, MD;  Location: [facility] ENDOSCOPY;  Service: Cardiovascular;  Laterality: N/A;   THORACENTESIS N/A 02/25/2021   Procedure: Mathews Robinsons;  Surgeon: Juanito Doom, MD;  Location: [facility];  Service: Cardiopulmonary;  Laterality: N/A;   TONSILLECTOMY AND ADENOIDECTOMY  ~ 1956   TOTAL KNEE ARTHROPLASTY Left 10/2006   TRIGGER FINGER RELEASE Left 10/2015   VIDEO BRONCHOSCOPY WITH ENDOBRONCHIAL ULTRASOUND Left 02/26/2021   Procedure: VIDEO BRONCHOSCOPY WITH ENDOBRONCHIAL ULTRASOUND;  Surgeon: Candee Furbish, MD;  Location: [facility] ENDOSCOPY;  Service: Pulmonary;  Laterality: Left;  cryoprobe too thanks!    REVIEW OF SYSTEMS:    Review of Systems  Constitutional: Positive for chronic fatigue. Negative for appetite change, chills, fever and unexpected weight change.  HENT:   Negative for mouth sores, nosebleeds, sore throat and trouble swallowing.   Eyes: Negative for eye problems and icterus.  Respiratory: Positive for shortness of breath and cough. Negative for hemoptysis and wheezing.   Cardiovascular: Negative for chest pain and leg swelling.  Gastrointestinal: Negative for abdominal pain, constipation, diarrhea, nausea and vomiting.  Genitourinary: Negative for bladder incontinence, difficulty urinating, dysuria, frequency and hematuria.   Musculoskeletal: Negative for back pain, gait problem, neck pain and neck stiffness.  Skin: Negative for itching and rash.  Neurological: Negative for dizziness, extremity weakness, gait problem, headaches, light-headedness and seizures.  Hematological: Negative for adenopathy. Does not bruise/bleed easily.  Psychiatric/Behavioral: Negative for confusion, depression and sleep disturbance. The patient is not nervous/anxious.     PHYSICAL EXAMINATION:  Blood pressure 128/77, pulse 99, temperature 98.7 F (37.1 C), temperature source Oral, resp. rate 18, weight 245 lb 14.4 oz (111.5 kg), SpO2 98 %.  ECOG PERFORMANCE STATUS: 1  Physical Exam  Constitutional: Oriented to person, place, and time and well-developed, well-nourished, and in no distress. HENT: Head: Normocephalic and atraumatic. Mouth/Throat: Oropharynx is clear and moist. No oropharyngeal exudate. Eyes: Conjunctivae are normal. Right eye exhibits no discharge. Left eye exhibits no discharge. No scleral icterus. Neck: Normal range of motion. Neck supple. Cardiovascular: Normal rate, regular rhythm, normal heart sounds and intact distal pulses.   Pulmonary/Chest: Effort normal. No respiratory distress. No wheezes. No rales. Abdominal: Soft. Bowel sounds are normal. Exhibits no distension and no mass. There is no  tenderness.  Musculoskeletal: Normal range of motion. Exhibits no edema.  Lymphadenopathy:    No cervical adenopathy.  Neurological: Alert and oriented to person, place, and time. Exhibits normal muscle tone. Examined in the wheelchair.  Skin: Skin is warm and dry. No rash noted. Not diaphoretic. No erythema. No pallor.  Psychiatric: Mood, memory and judgment normal. Vitals reviewed.  LABORATORY DATA: Lab Results  Component Value Date   WBC 4.8 03/31/2021   HGB 10.5 (L) 03/31/2021   HCT 32.0 (L) 03/31/2021   MCV 90.9 03/31/2021   PLT 311 03/31/2021      Chemistry      Component Value Date/Time   NA 135 03/31/2021 1020   NA 137 01/21/2021 1553   K 3.6 03/31/2021 1020   CL 105 03/31/2021 1020   CO2 20 (L) 03/31/2021 1020   BUN 19 03/31/2021 1020   BUN 17 01/21/2021 1553   CREATININE 0.85 03/31/2021 1020   CREATININE 1.04 08/30/2016 0840      Component Value Date/Time   CALCIUM 9.2 03/31/2021 1020   ALKPHOS 43 03/31/2021 1020   AST 14 (L) 03/31/2021 1020   ALT 12 03/31/2021 1020   BILITOT 0.4 03/31/2021 1020       RADIOGRAPHIC STUDIES:  DG Chest 1 View  Result Date: 03/20/2021 CLINICAL DATA:  History of stage IV lung cancer. EXAM: CHEST  1 VIEW COMPARISON:  Radiograph 03/17/2021.  CT 02/24/2021 FINDINGS: Patient's left-sided PleurX catheter is not well-defined on the current exam, faintly visualized in the mid lower thorax. Left pleural effusion and hazy opacity at the left lung base, similar to prior exam allowing for differences in technique. Chronic left lung volume loss. Implanted loop recorder projects over the left chest wall. Stable heart size and mediastinal contours with aortic atherosclerosis the right lung is clear. No pneumothorax. IMPRESSION: Left-sided PleurX catheter is not well-defined on the current exam, faintly visualized in the mid lower thorax. Similar appearance of left pleural effusion and basilar opacity to  prior exam allowing for differences in  technique. No pneumothorax. Electronically Signed   By: Corey [name] M.D.   On: 03/20/2021 21:14   DG Chest 2 View  Result Date: 03/09/2021 CLINICAL DATA:  pleural effusion EXAM: CHEST - 2 VIEW COMPARISON:  02/25/2021 FINDINGS: Unchanged cardiomediastinal silhouette. Cardiac device overlies the left mid chest. Decreased size of the left pleural effusion. There is slight improved aeration in the left lung base. Of there is no visible pneumothorax. Bones are unchanged. IMPRESSION: Decreased size of the left pleural effusion with slight improved aeration in the left lung base. Electronically Signed   By: Maurine Simmering   On: 03/09/2021 09:45   CT Chest Wo Contrast  Result Date: 03/21/2021 CLINICAL DATA:  Chest pain and shortness of breath EXAM: CT CHEST WITHOUT CONTRAST TECHNIQUE: Multidetector CT imaging of the chest was performed following the standard protocol without IV contrast. COMPARISON:  None. FINDINGS: Cardiovascular: Calcific aortic atherosclerosis and coronary artery atherosclerosis. Trace pericardial fluid. Heart size is normal. Mediastinum/Nodes: 13 mm preaortic lymph node. 15 mm subcarinal lymph node. No axillary adenopathy. Esophagus is unremarkable. Lungs/Pleura: Filling defects in the left lower lobe bronchial tree with associated atelectasis. Right lung is clear. Small left pleural effusion. There is a tunneled pleural catheter at the left lung base. Unchanged nodular foci in the lingula. Upper Abdomen: No acute abnormality. Musculoskeletal: No chest wall mass or suspicious bone lesions identified. IMPRESSION: 1. Filling defects in the left lower lobe bronchial tree with associated atelectasis, likely mucous plugging. 2. Small left pleural effusion with tunneled pleural catheter at the left lung base. 3. Mildly enlarged mediastinal lymph nodes, likely reactive. Aortic Atherosclerosis (ICD10-I70.0). Electronically Signed   By: Corey [name] M.D.   On: 03/21/2021 01:05   MR BRAIN W WO  CONTRAST  Result Date: 03/19/2021 CLINICAL DATA:  Staging of non-small cell lung carcinoma EXAM: MRI HEAD WITHOUT AND WITH CONTRAST TECHNIQUE: Multiplanar, multiecho pulse sequences of the brain and surrounding structures were obtained without and with intravenous contrast. CONTRAST:  49m GADAVIST GADOBUTROL 1 MMOL/ML IV SOLN COMPARISON:  07/06/2012 FINDINGS: Brain: No acute infarct, mass effect or extra-axial collection. No acute or chronic hemorrhage. Hyperintense T2-weighted signal is moderately widespread throughout the white matter. Generalized volume loss without a clear lobar predilection. Old left parietal infarct. The midline structures are normal. There is no abnormal contrast enhancement. Vascular: Major flow voids are preserved. Skull and upper cervical spine: Normal calvarium and skull base. Visualized upper cervical spine and soft tissues are normal. Sinuses/Orbits:No paranasal sinus fluid levels or advanced mucosal thickening. No mastoid or middle ear effusion. Normal orbits. IMPRESSION: 1. No intracranial metastatic disease. 2. 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 Electronically Signed   By: KUlyses JarredM.D.   On: 03/19/2021 22:06   NM PET Image Initial (PI) Skull Base To Thigh  Result Date: 03/25/2021 CLINICAL DATA:  Initial treatment strategy for lung cancer. EXAM: NUCLEAR MEDICINE PET SKULL BASE TO THIGH TECHNIQUE: 11.3 mCi F-18 FDG was injected intravenously. Full-ring PET imaging was performed from the skull base to thigh after the radiotracer. CT data was obtained and used for attenuation correction and anatomic localization. Fasting blood glucose: 96 mg/dl COMPARISON:  CT chest 03/21/2021, 02/24/2021. FINDINGS: Mediastinal blood pool activity: SUV max 2.6 Liver activity: SUV max NA NECK: No abnormal hypermetabolism. Incidental CT findings: None. CHEST: Mediastinal lymph nodes measure up to 11 mm in the AP window an SUV max 23. Subcarinal lymph nodes  measure up to  1.4 cm with an SUV max 15.0. Left hilar hypermetabolism, SUV max 11.0. Prepericardiac lymph nodes measure up to 11 mm with an SUV max of 8.6. Central left lower lobe mass is difficult to measure without IV contrast but dimensions are roughly 2.8 x 3.8 cm with an SUV max 14.8. Pleural thickening and hypermetabolic pleural/subpleural nodularity in the left hemithorax. Index subpleural lingular nodule measures 11 mm (8/41) with an SUV max of 9.1. Incidental CT findings: Interstitial thickening and nodularity in the left lower lobe, worrisome for lymphangitic carcinomatosis. Partial obstruction of the left lower lobe bronchus. Atherosclerotic calcification of the aorta, aortic valve and coronary arteries. Heart is at the upper limits of normal in size to mildly enlarged. No pericardial effusion. Thin rind of pleural fluid at the base of the left hemithorax with a percutaneous drain in place. ABDOMEN/PELVIS: Hypermetabolic lesions are seen in the dome of the liver, right hepatic lobe and caudate. Caudate mass measures 3.7 x 4.6 cm with an SUV max of 19.0. No abnormal hypermetabolism in the adrenal glands, spleen or pancreas. Hypermetabolic celiac axis lymph node measures 7 mm (4/119) with an SUV max of 7.8. No additional hypermetabolic lymph nodes. Incidental CT findings: Liver, gallbladder, adrenal glands and right kidney are unremarkable. Subcentimeter high and low-attenuation lesions in the left kidney are too small to characterize. Spleen, pancreas, stomach and bowel are unremarkable. Atherosclerotic calcification of the aorta. SKELETON: No abnormal hypermetabolism. Incidental CT findings: Degenerative and postoperative changes in the spine. IMPRESSION: 1. Hypermetabolic left lower lobe mass with hypermetabolic intrathoracic and upper abdominal lymph nodes, left pleural/subpleural nodularity and hepatic lesions, consistent with stage IV primary bronchogenic carcinoma. 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 2. Small loculated left pleural effusion with pleural drainage catheter in place. 3.  Aortic atherosclerosis (ICD10-I70.0). Electronically Signed   By: Corey [name] M.D.   On: 03/25/2021 10:17   DG CHEST PORT 1 VIEW  Result Date: 03/17/2021 CLINICAL DATA:  Status post PleurX catheter placement EXAM: PORTABLE CHEST 1 VIEW COMPARISON:  04/08/2021 FINDINGS: Cardiac shadow is enlarged. Loop recorder is again identified. New PleurX catheter is noted on the left. Small residual effusion remains. No pneumothorax is identified. IMPRESSION: PleurX catheter in satisfactory position. Small residual effusion is noted. No pneumothorax is seen. Electronically Signed   By: Inez Catalina M.D.   On: 03/17/2021 16:00   CUP PACEART REMOTE DEVICE CHECK  Result Date: 03/30/2021 ILR summary report received. Battery status OK. Normal device function. No new symptom, tachy, brady, or pause episodes. No new AF episodes. 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  He is negative for any actionable mutations. His PDL1 expression is 30%.  The patient completed palliative radiotherapy to the obstructive lung mass in the left lung under the care of Dr. Tammi Klippel.  Last treatment on 03/23/2021.  He is currently undergoing palliative systemic chemotherapy and is expected to start his first dose of treatment today. His treatment consists carboplatin for an AUC of 5, Alimta 500 mg/m, and Keytruda 200 mg IV every 3 weeks.   The patient was seen with Dr. [name]. Dr. [name] personally and independently reviewed the patient's  brain MRI  and PET scan and discussed the resutls with the patient. Labs were reviewed. Recommend that he proceed with cycle #1 today as scheduled.   We will see him back for a follow up visit in 1 week for evaluation and a 1 week follow up to manage any adverse side effects of treatment.   He will continue to follow with pulmonology regarding management and recommendations for drainage of his pleurx.  We will monitor his labs closely weekly given his baseline anemia even prior to starting treatment. Regarding his fatigue, his baseline TSH is normal. May be multiple causes such as anemia, recent radiation, medication side effects, etc. The patient is also sedenary. I encouraged him to increase his physical activity when able to prevent further deconditioning.   He was advised to use his carafate prior to eating meals.   The patient was advised to call immediately if he has any concerning symptoms in the interval. The patient voices understanding of current disease status and treatment options and is in agreement with the current care plan. All questions were answered. The patient knows to call the clinic with any problems, questions or concerns. We can certainly see the patient much sooner if necessary  No orders of the defined types were placed in this encounter.     Shaul Trautman L Sanaz Scarlett, PA-C 03/31/21  ADDENDUM: Hematology/Oncology Attending: I had a face-to-face encounter with the patient today.  I reviewed his record, lab and scan and recommended his care plan.  This is a very pleasant 75 years old white male diagnosed with a stage IV (T1b, N2, M1c) diagnosed in May 2022 and presented with left upper lobe lung mass in addition to left hilar and mediastinal lymphadenopathy as well as malignant left pleural effusion, intrathoracic and upper abdominal lymph node as well as liver metastasis.  The patient has no actionable mutations and PD-L1 expression was 30%. He completed a course of  palliative radiotherapy to the obstructive lung mass in the left lung under the care of Dr. Tammi Klippel completed March 23, 2021. I had a lengthy discussion with the patient and his wife today about his current condition and treatment options.  I personally and independently reviewed the scan images and discussed the results with the patient and his wife. His MRI of the brain showed no concerning findings for brain metastasis. I recommended for the patient to proceed with a course of systemic chemotherapy with carboplatin for AUC of 5, Alimta 500 Mg/M2 and Keytruda 200 Mg IV every 3 weeks as planned today. We will see him back for follow-up visit in 1 week for evaluation and management of any adverse effect of his treatment. The patient was advised to call immediately if he has any other concerning symptoms in the interval. The total time spent in the appointment was 30 minutes.  Disclaimer: This note was dictated with voice recognition software. Similar sounding words can inadvertently be transcribed and may be missed upon review. Eilleen Kempf, MD 03/31/21

## 2021-03-27 ENCOUNTER — Other Ambulatory Visit: Payer: Self-pay

## 2021-03-27 ENCOUNTER — Ambulatory Visit: Payer: Medicare PPO | Admitting: Pulmonary Disease

## 2021-03-27 ENCOUNTER — Encounter: Payer: Self-pay | Admitting: Pulmonary Disease

## 2021-03-27 VITALS — BP 118/78 | HR 85 | Ht 74.0 in | Wt 247.4 lb

## 2021-03-27 DIAGNOSIS — D630 Anemia in neoplastic disease: Secondary | ICD-10-CM | POA: Diagnosis not present

## 2021-03-27 DIAGNOSIS — Z48813 Encounter for surgical aftercare following surgery on the respiratory system: Secondary | ICD-10-CM | POA: Diagnosis not present

## 2021-03-27 DIAGNOSIS — C3492 Malignant neoplasm of unspecified part of left bronchus or lung: Secondary | ICD-10-CM

## 2021-03-27 DIAGNOSIS — I2510 Atherosclerotic heart disease of native coronary artery without angina pectoris: Secondary | ICD-10-CM | POA: Diagnosis not present

## 2021-03-27 DIAGNOSIS — J910 Malignant pleural effusion: Secondary | ICD-10-CM | POA: Diagnosis not present

## 2021-03-27 DIAGNOSIS — I10 Essential (primary) hypertension: Secondary | ICD-10-CM | POA: Diagnosis not present

## 2021-03-27 DIAGNOSIS — J90 Pleural effusion, not elsewhere classified: Secondary | ICD-10-CM

## 2021-03-27 DIAGNOSIS — Z4801 Encounter for change or removal of surgical wound dressing: Secondary | ICD-10-CM | POA: Diagnosis not present

## 2021-03-27 DIAGNOSIS — C61 Malignant neoplasm of prostate: Secondary | ICD-10-CM | POA: Diagnosis not present

## 2021-03-27 DIAGNOSIS — C3432 Malignant neoplasm of lower lobe, left bronchus or lung: Secondary | ICD-10-CM | POA: Diagnosis not present

## 2021-03-27 DIAGNOSIS — Z9689 Presence of other specified functional implants: Secondary | ICD-10-CM

## 2021-03-27 DIAGNOSIS — I480 Paroxysmal atrial fibrillation: Secondary | ICD-10-CM | POA: Diagnosis not present

## 2021-03-27 NOTE — Progress Notes (Signed)
Synopsis: Referred in June 2022 for malignant pleural effusion, PCP: By [name], MD  Subjective:   PATIENT ID: Corey [name] GENDER: male DOB: 02/10/1946, MRN: [id number]  Chief Complaint  Patient presents with   Follow-up    Patient reports the it was drained on Monday.  03/18/21-1000 ml, 03/20/21-550 ml,  03/22/21- 250 ml, 03/23/21 4m    This is a 75year old gentleman, past medical history of coronary artery disease, coronary artery disease, gastroesophageal reflux, atrial fibrillation on Xarelto, OSA on CPAP.  Patient was recently admitted to the hospital on 02/24/2021.  Discharge summary reviewed from Dr. MMaye Hides  Patient was admitted for evaluation of dyspnea.  Found to have large left-sided pleural effusion with left-sided infrahilar mass.  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  Patient was taken for thoracentesis and bronchoscopy by Dr. STamala Julian  Pathology was consistent with non-small cell carcinoma.  Patient had subsequent follow-up medical oncology on 03/05/2021.  Met with Dr. MEarlie Server  Office note reviewed.  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  Patient scheduled for follow-up with me today due to complaints of ongoing shortness of breath and dyspnea and discussion of outpatient thoracentesis.  And management of recurrent pleural effusion.  OV 03/27/2021: Here today for follow-up after recent indwelling pleural catheter placement.  He has been doing well since then.  Please see drainage report above initially 8000 cc now has drained down to approximately 75 cc on the 20th.  He has been draining every couple of days.  At this point shortness of breath has resolved.  His wound site looks well.  Has home health its been coming out to help him drain and check the site.  From respiratory  standpoint doing better.  He has first round of chemotherapy scheduled for next week.   Past Medical History:  Diagnosis Date   Anemia    Anxiety    Arthritis    "back, right knee, hands, ankles, neck" (05/31/2016)   Carotid artery disease ([place] 08/30/2016   Carotid [place]: R 1-39, L 40-59 // Carotid [place]2020: R 1-39; L 40-59 // Carotid [place]21: R 1-39; L 40-59>> repeat 1 year    Chronic lower back pain    Coronary atherosclerosis of native coronary artery    [facility], otherwise mild nonobstructive disease. EF normal.   Diverticulitis    Dyslipidemia    Dyspnea    Essential hypertension, benign    Family history of breast cancer    Family history of lung cancer    Family history of prostate cancer    GERD (gastroesophageal reflux disease)    Lumbar radiculopathy, chronic 02/04/2015   Right L5   Obesity    OSA on CPAP    uses cpap   [place]    a. Discovered after stroke.   Pneumonia 01/1996   PONV (postoperative nausea and vomiting)    Prostate CA (HCC)    Recurrent upper respiratory infection (URI)    [place]   no deficits   Visit for monitoring Tikosyn therapy 09/18/2019     Family History  Problem Relation Age of Onset   Hypertension Mother    Heart disease Father    Heart attack Father    Prostate cancer Brother 742  Parkinson's disease  Brother    Lung cancer Paternal Aunt        hx of smoking   Breast cancer Paternal Grandmother        dx 35s, bilateral mastectomies   Heart attack Paternal Grandfather    Blindness Son    Colon cancer Neg Hx    Pancreatic cancer Neg Hx      Past Surgical History:  Procedure Laterality Date   ADENOIDECTOMY     ANTERIOR CERVICAL DECOMP/DISCECTOMY FUSION  07/2001; 10/2002   "C5-6; C6-7; redo"   BACK SURGERY     CARPAL TUNNEL RELEASE Left 10/2015   CHEST TUBE INSERTION Left 03/17/2021   Procedure: INSERTION PLEURAL DRAINAGE CATHETER;  Surgeon: Garner Nash, DO;  Location: [facility];  Service: Pulmonary;  Laterality: Left;  Indwelling Tunneled Pleural catheter (PLEUREX)    COLONOSCOPY W/ POLYPECTOMY  02/2014   CORONARY ANGIOPLASTY WITH STENT PLACEMENT  05/2003; 12/2005   "mid RCA; mid RCA"   FINE NEEDLE ASPIRATION  02/26/2021   Procedure: FINE NEEDLE ASPIRATION (FNA) LINEAR;  Surgeon: Candee Furbish, MD;  Location: [facility] ENDOSCOPY;  Service: Pulmonary;;   HAND SURGERY  02/2019   LEFT HAND   implantable loop recorder placement  02/27/2020   Medtronic Reveal [place] model [id number]  implantable loop recorder implanted by Dr Rayann Heman for afib management and evaluation of presyncope   JOINT REPLACEMENT     KNEE ARTHROSCOPY Left 10/2005   KNEE ARTHROSCOPY W/ PARTIAL MEDIAL MENISCECTOMY Left 09/2005   LUMBAR LAMINECTOMY/DECOMPRESSION MICRODISCECTOMY  03/2005   "L4-5"   POSTERIOR LUMBAR FUSION  10/2003   L5-S1; "plates, screws"   SHOULDER ARTHROSCOPY Right 08/2011   Debridement of labrum, arthroscopic distal clavicle excision   SHOULDER OPEN ROTATOR CUFF REPAIR Left 07/2014   TEE WITHOUT CARDIOVERSION  07/07/2012   Procedure: TRANSESOPHAGEAL ECHOCARDIOGRAM (TEE);  Surgeon: Fay Records, MD;  Location[facility] ENDOSCOPY;  Service: Cardiovascular;  Laterality: N/A;   THORACENTESIS N/A 02/25/2021   Procedure: Mathews Robinsons;  Surgeon: Juanito Doom, MD;  Location: [place];  Service: Cardiopulmonary;  Laterality: N/A;   TONSILLECTOMY AND ADENOIDECTOMY  ~ 1956   TOTAL KNEE ARTHROPLASTY Left 10/2006   TRIGGER FINGER RELEASE Left 10/2015   VIDEO BRONCHOSCOPY WITH ENDOBRONCHIAL ULTRASOUND Left 02/26/2021   Procedure: VIDEO BRONCHOSCOPY WITH ENDOBRONCHIAL ULTRASOUND;  Surgeon: Candee Furbish, MD;  Location: [facility] ENDOSCOPY;  Service: Pulmonary;  Laterality: Left;  cryoprobe too thanks!    Social History   Socioeconomic History   Marital status: Married    Spouse name: Renee   Number of children: 3   Years of education: 14   Highest education level:  Not on file  Occupational History   Occupation: Retired    Fish farm manager: DISABLED     Comment: Works at [employer] part time  Tobacco Use   Smoking status: Former    Packs/day: 1.00    Years: 34.00    Pack years: 34.00    Types: Cigarettes    Quit date: 05/05/2003    Years since quitting: 17.9   Smokeless tobacco: Never  Vaping Use   Vaping Use: Never used  Substance and Sexual Activity   Alcohol use: No    Alcohol/week: 0.0 standard drinks   Drug use: No   Sexual activity: Not Currently  Other Topics Concern   Not on file  Social History Narrative   Patient lives at home with spouse.   Caffeine Use:    Social Determinants of Health   Financial Resource Strain:  Not on file  Food Insecurity: Not on file  Transportation Needs: Not on file  Physical Activity: Not on file  Stress: Not on file  Social Connections: Not on file  Intimate Partner Violence: Not on file     Allergies  Allergen Reactions   Amoxicillin Swelling   Fosinopril Other (See Comments)   Hydromorphone Nausea Only, Other (See Comments) and Nausea And Vomiting   Ampicillin Rash   Monopril [Fosinopril Sodium] Other (See Comments)    Muscle aches & pain   Other Other (See Comments)   Rosuvastatin      Outpatient Medications Prior to Visit  Medication Sig Dispense Refill   acetaminophen (TYLENOL 8 HOUR ARTHRITIS PAIN) 650 MG CR tablet Take 1,300 mg by mouth as needed for pain (pain).      albuterol (VENTOLIN HFA) 108 (90 Base) MCG/ACT inhaler Inhale 2 puffs into the lungs every 4 hours as needed 6.7 each 0   atorvastatin (LIPITOR) 40 MG tablet Take 1 tablet (40 mg total) by mouth daily. 90 tablet 3   Carbinoxamine Maleate 6 MG TABS Take 1 tablet by mouth 2 (two) times daily as needed (drainage). 60 tablet 4   clindamycin (CLEOCIN) 300 MG capsule Take 300 mg by mouth See admin instructions. Uses for dental procedures     diclofenac Sodium (VOLTAREN) 1 % GEL Apply 4 g topically 4 (four) times daily. 100 g 0    dicyclomine (BENTYL) 20 MG tablet Take 20 mg by mouth as needed for spasms.     diltiazem (CARDIZEM) 30 MG tablet Take 1 tablet (30 mg total) by mouth 4 (four) times daily as needed. 60 tablet 6   dofetilide (TIKOSYN) 500 MCG capsule Take 1 capsule (500 mcg total) by mouth 2 (two) times daily. Please keep upcoming appointment in February for future refills. Thank you 180 capsule 3   feeding supplement (ENSURE ENLIVE / ENSURE PLUS) LIQD Take 237 mLs by mouth 2 (two) times daily between meals. 60 mL 0   fenofibrate 160 MG tablet TAKE 1 TABLET DAILY. PLEASE KEEP UPCOMING APPT IN MARCH WITH DR. Acie Fredrickson BEFORE ANYMORE REFILLS. 90 tablet 3   fluticasone (FLONASE) 50 MCG/ACT nasal spray Place 2 sprays into both nostrils daily. 32.3 mL 5   folic acid (FOLVITE) 1 MG tablet Take 1 tablet (1 mg total) by mouth daily. 30 tablet 2   ipratropium (ATROVENT) 0.03 % nasal spray Place 1-2 sprays into both nostrils every 12 (twelve) hours. For drainage 30 mL 5   leuprolide, 6 Month, (ELIGARD) 45 MG injection Inject 45 mg into the skin every 6 (six) months.     loperamide (IMODIUM) 2 MG capsule Take 2 mg by mouth as needed for diarrhea or loose stools.     magnesium oxide (MAG-OX) 400 MG tablet TAKE 1 TABLET BY MOUTH EVERY DAY 90 tablet 2   metoprolol succinate (TOPROL XL) 25 MG 24 hr tablet Take 1 tablet (25 mg total) by mouth daily. 90 tablet 3   Multiple Vitamins-Minerals (MULTIVITAMINS THER. W/MINERALS) TABS Take 1 tablet by mouth daily.     nitroGLYCERIN (NITROSTAT) 0.4 MG SL tablet Place 1 tablet (0.4 mg total) under the tongue every 5 (five) minutes as needed for chest pain. 25 tablet 5   omeprazole (PRILOSEC) 20 MG capsule Take 1 capsule (20 mg total) by mouth daily. 30 capsule 5   ondansetron (ZOFRAN) 8 MG tablet Take 1 tablet (8 mg total) by mouth every 8 (eight) hours as needed for nausea or vomiting.  Starting 3 days after chemotherapy 30 tablet 2   potassium chloride (KLOR-CON) 10 MEQ tablet Take 1 tablet  (10 mEq total) by mouth daily. 90 tablet 2   predniSONE (DELTASONE) 5 MG tablet Take 1 tablet (5 mg total) by mouth daily with breakfast for 15 days. 15 tablet 5   promethazine (PHENERGAN) 25 MG tablet Take 1 tablet (25 mg total) by mouth every 6 (six) hours as needed for nausea or vomiting. 30 tablet 1   Saccharomyces boulardii (PROBIOTIC) 250 MG CAPS Take 1 capsule by mouth daily.     sucralfate (CARAFATE) 1 GM/10ML suspension Take 10 mLs (1 g total) by mouth 4 (four) times daily. Before meals 420 mL 1   tamsulosin (FLOMAX) 0.4 MG CAPS capsule Take 0.4 mg by mouth daily.     topiramate (TOPAMAX) 50 MG tablet Take 2 tablets (100 mg total) by mouth 2 (two) times daily. 360 tablet 3   traMADol (ULTRAM) 50 MG tablet Take 1 tablet (50 mg total) by mouth every 6 (six) hours as needed for moderate pain. 30 tablet 0   traZODone (DESYREL) 50 MG tablet Take 0.5 tablets (25 mg total) by mouth at bedtime as needed for sleep. 45 tablet 3   Vitamin D, Ergocalciferol, (DRISDOL) 1.25 MG (50000 UNIT) CAPS capsule TAKE 1 CAPSULE BY MOUTH ONE TIME PER WEEK 12 capsule 1   XARELTO 20 MG TABS tablet TAKE 1 TABLET BY MOUTH EVERY DAY 90 tablet 2   No facility-administered medications prior to visit.    Review of Systems  Constitutional:  Negative for chills, fever, malaise/fatigue and weight loss.  HENT:  Negative for hearing loss, sore throat and tinnitus.   Eyes:  Negative for blurred vision and double vision.  Respiratory:  Positive for shortness of breath. Negative for cough, hemoptysis, sputum production, wheezing and stridor.   Cardiovascular:  Negative for chest pain, palpitations, orthopnea, leg swelling and PND.  Gastrointestinal:  Negative for abdominal pain, constipation, diarrhea, heartburn, nausea and vomiting.  Genitourinary:  Negative for dysuria, hematuria and urgency.  Musculoskeletal:  Negative for joint pain and myalgias.  Skin:  Negative for itching and rash.  Neurological:  Negative for  dizziness, tingling, weakness and headaches.  Endo/Heme/Allergies:  Negative for environmental allergies. Does not bruise/bleed easily.  Psychiatric/Behavioral:  Negative for depression. The patient is not nervous/anxious and does not have insomnia.   All other systems reviewed and are negative.   Objective:  Physical Exam Vitals reviewed.  Constitutional:      General: He is not in acute distress.    Appearance: He is well-developed. He is obese.  HENT:     Head: Normocephalic and atraumatic.  Eyes:     General: No scleral icterus.    Conjunctiva/sclera: Conjunctivae normal.     Pupils: Pupils are equal, round, and reactive to light.  Neck:     Vascular: No JVD.     Trachea: No tracheal deviation.  Cardiovascular:     Rate and Rhythm: Normal rate and regular rhythm.     Heart sounds: Normal heart sounds. No murmur heard. Pulmonary:     Effort: Pulmonary effort is normal. No tachypnea, accessory muscle usage or respiratory distress.     Breath sounds: No stridor. No wheezing, rhonchi or rales.     Comments: Diminished breath sounds in the left base but they are much improved in comparison to previous. Abdominal:     General: Bowel sounds are normal. There is no distension.     Palpations:  Abdomen is soft.     Tenderness: There is no abdominal tenderness.  Musculoskeletal:        General: No tenderness.     Cervical back: Neck supple.  Lymphadenopathy:     Cervical: No cervical adenopathy.  Skin:    General: Skin is warm and dry.     Capillary Refill: Capillary refill takes less than 2 seconds.     Findings: No rash.  Neurological:     Mental Status: He is alert and oriented to person, place, and time.  Psychiatric:        Behavior: Behavior normal.     Vitals:   03/27/21 1647  BP: 118/78  Pulse: 85  SpO2: 97%  Weight: 247 lb 6.4 oz (112.2 kg)  Height: 6' 2"  (1.88 m)   97% on RA BMI Readings from Last 3 Encounters:  03/27/21 31.76 kg/m  03/20/21 30.81 kg/m   03/18/21 30.69 kg/m   Wt Readings from Last 3 Encounters:  03/27/21 247 lb 6.4 oz (112.2 kg)  03/20/21 240 lb (108.9 kg)  03/18/21 235 lb 12.8 oz (107 kg)     CBC    Component Value Date/Time   WBC 4.9 03/20/2021 2100   RBC 3.13 (L) 03/20/2021 2100   HGB 9.2 (L) 03/20/2021 2100   HGB 9.5 (L) 03/18/2021 1441   HGB 12.3 (L) 06/25/2020 1336   HCT 28.5 (L) 03/20/2021 2100   HCT 35.6 (L) 06/25/2020 1336   PLT 346 03/20/2021 2100   PLT 349 03/18/2021 1441   PLT 275 06/25/2020 1336   MCV 91.1 03/20/2021 2100   MCV 93 06/25/2020 1336   MCH 29.4 03/20/2021 2100   MCHC 32.3 03/20/2021 2100   RDW 15.5 03/20/2021 2100   RDW 14.9 06/25/2020 1336   LYMPHSABS 0.4 (L) 03/20/2021 2100   MONOABS 0.5 03/20/2021 2100   EOSABS 0.2 03/20/2021 2100   BASOSABS 0.0 03/20/2021 2100    Chest Imaging: Chest x-ray completed today 03/09/2021: Reduction in left-sided pleural effusion. No pneumothorax The patient's images have been independently reviewed by me.    Pulmonary Functions Testing Results: No flowsheet data found.  FeNO:   Pathology:   Echocardiogram:   Heart Catheterization:     Assessment & Plan:     ICD-10-CM   1. Non-small cell carcinoma of left lung, stage 4 (HCC)  C34.92     2. Pleural effusion on left  J90     3. Chest tube in place  Z96.89     4. Malignant pleural effusion  J91.0       Discussion:  This is a 75 year old gentleman new diagnosis of stage IV non-small cell lung cancer with recurrent left-sided malignant pleural effusion status post indwelling pleural catheter placement, tunneled with cuff.  Patient tolerating home drainage through device.  Plan: Site check today. Wound looks good.  Sutures removed today in the office No signs of infection. Recommended continued routine monitoring of the site.  Patient informed if there is any change at the site, redness warmth or drainage to call us and let us know immediately. He should continue drainage of  his catheter at least once to twice per week to ensure patency of the tube. Chest tube will likely remain in place until he is completed his chemotherapy and see how he responds.  Patient to follow-up with me in 6 weeks to check site.     Current Outpatient Medications:    acetaminophen (TYLENOL 8 HOUR ARTHRITIS PAIN) 650 MG CR tablet, Take  1,300 mg by mouth as needed for pain (pain). , Disp: , Rfl:    albuterol (VENTOLIN HFA) 108 (90 Base) MCG/ACT inhaler, Inhale 2 puffs into the lungs every 4 hours as needed, Disp: 6.7 each, Rfl: 0   atorvastatin (LIPITOR) 40 MG tablet, Take 1 tablet (40 mg total) by mouth daily., Disp: 90 tablet, Rfl: 3   Carbinoxamine Maleate 6 MG TABS, Take 1 tablet by mouth 2 (two) times daily as needed (drainage)., Disp: 60 tablet, Rfl: 4   clindamycin (CLEOCIN) 300 MG capsule, Take 300 mg by mouth See admin instructions. Uses for dental procedures, Disp: , Rfl:    diclofenac Sodium (VOLTAREN) 1 % GEL, Apply 4 g topically 4 (four) times daily., Disp: 100 g, Rfl: 0   dicyclomine (BENTYL) 20 MG tablet, Take 20 mg by mouth as needed for spasms., Disp: , Rfl:    diltiazem (CARDIZEM) 30 MG tablet, Take 1 tablet (30 mg total) by mouth 4 (four) times daily as needed., Disp: 60 tablet, Rfl: 6   dofetilide (TIKOSYN) 500 MCG capsule, Take 1 capsule (500 mcg total) by mouth 2 (two) times daily. Please keep upcoming appointment in February for future refills. Thank you, Disp: 180 capsule, Rfl: 3   feeding supplement (ENSURE ENLIVE / ENSURE PLUS) LIQD, Take 237 mLs by mouth 2 (two) times daily between meals., Disp: 60 mL, Rfl: 0   fenofibrate 160 MG tablet, TAKE 1 TABLET DAILY. PLEASE KEEP UPCOMING APPT IN MARCH WITH DR. Acie Fredrickson BEFORE ANYMORE REFILLS., Disp: 90 tablet, Rfl: 3   fluticasone (FLONASE) 50 MCG/ACT nasal spray, Place 2 sprays into both nostrils daily., Disp: 80.9 mL, Rfl: 5   folic acid (FOLVITE) 1 MG tablet, Take 1 tablet (1 mg total) by mouth daily., Disp: 30 tablet,  Rfl: 2   ipratropium (ATROVENT) 0.03 % nasal spray, Place 1-2 sprays into both nostrils every 12 (twelve) hours. For drainage, Disp: 30 mL, Rfl: 5   leuprolide, 6 Month, (ELIGARD) 45 MG injection, Inject 45 mg into the skin every 6 (six) months., Disp: , Rfl:    loperamide (IMODIUM) 2 MG capsule, Take 2 mg by mouth as needed for diarrhea or loose stools., Disp: , Rfl:    magnesium oxide (MAG-OX) 400 MG tablet, TAKE 1 TABLET BY MOUTH EVERY DAY, Disp: 90 tablet, Rfl: 2   metoprolol succinate (TOPROL XL) 25 MG 24 hr tablet, Take 1 tablet (25 mg total) by mouth daily., Disp: 90 tablet, Rfl: 3   Multiple Vitamins-Minerals (MULTIVITAMINS THER. W/MINERALS) TABS, Take 1 tablet by mouth daily., Disp: , Rfl:    nitroGLYCERIN (NITROSTAT) 0.4 MG SL tablet, Place 1 tablet (0.4 mg total) under the tongue every 5 (five) minutes as needed for chest pain., Disp: 25 tablet, Rfl: 5   omeprazole (PRILOSEC) 20 MG capsule, Take 1 capsule (20 mg total) by mouth daily., Disp: 30 capsule, Rfl: 5   ondansetron (ZOFRAN) 8 MG tablet, Take 1 tablet (8 mg total) by mouth every 8 (eight) hours as needed for nausea or vomiting. Starting 3 days after chemotherapy, Disp: 30 tablet, Rfl: 2   potassium chloride (KLOR-CON) 10 MEQ tablet, Take 1 tablet (10 mEq total) by mouth daily., Disp: 90 tablet, Rfl: 2   predniSONE (DELTASONE) 5 MG tablet, Take 1 tablet (5 mg total) by mouth daily with breakfast for 15 days., Disp: 15 tablet, Rfl: 5   promethazine (PHENERGAN) 25 MG tablet, Take 1 tablet (25 mg total) by mouth every 6 (six) hours as needed for nausea or vomiting.,  Disp: 30 tablet, Rfl: 1   Saccharomyces boulardii (PROBIOTIC) 250 MG CAPS, Take 1 capsule by mouth daily., Disp: , Rfl:    sucralfate (CARAFATE) 1 GM/10ML suspension, Take 10 mLs (1 g total) by mouth 4 (four) times daily. Before meals, Disp: 420 mL, Rfl: 1   tamsulosin (FLOMAX) 0.4 MG CAPS capsule, Take 0.4 mg by mouth daily., Disp: , Rfl:    topiramate (TOPAMAX) 50 MG  tablet, Take 2 tablets (100 mg total) by mouth 2 (two) times daily., Disp: 360 tablet, Rfl: 3   traMADol (ULTRAM) 50 MG tablet, Take 1 tablet (50 mg total) by mouth every 6 (six) hours as needed for moderate pain., Disp: 30 tablet, Rfl: 0   traZODone (DESYREL) 50 MG tablet, Take 0.5 tablets (25 mg total) by mouth at bedtime as needed for sleep., Disp: 45 tablet, Rfl: 3   Vitamin D, Ergocalciferol, (DRISDOL) 1.25 MG (50000 UNIT) CAPS capsule, TAKE 1 CAPSULE BY MOUTH ONE TIME PER WEEK, Disp: 12 capsule, Rfl: 1   XARELTO 20 MG TABS tablet, TAKE 1 TABLET BY MOUTH EVERY DAY, Disp: 90 tablet, Rfl: 2  I spent 31 minutes dedicated to the care of this patient on the date of this encounter to include pre-visit review of records, face-to-face time with the patient discussing conditions above, post visit ordering of testing, clinical documentation with the electronic health record, making appropriate referrals as documented, and communicating necessary findings to members of the patients care team.    Garner Nash, DO [facility] 03/27/2021 6:35 PM

## 2021-03-27 NOTE — Patient Instructions (Addendum)
Thank you for visiting Dr. Valeta Harms at [facility]. Today we recommend the following:  Continue draining at least twice weekly.  We want to keep the tube patent.   Return in about 6 weeks (around 05/08/2021) for with APP or Dr. Valeta Harms.    Please do your part to reduce the spread of COVID-19.

## 2021-03-30 ENCOUNTER — Ambulatory Visit (INDEPENDENT_AMBULATORY_CARE_PROVIDER_SITE_OTHER): Payer: Medicare PPO

## 2021-03-30 DIAGNOSIS — R55 Syncope and collapse: Secondary | ICD-10-CM | POA: Diagnosis not present

## 2021-03-30 LAB — CUP PACEART REMOTE DEVICE CHECK
Date Time Interrogation Session: 20220626100701
Implantable Pulse Generator Implant Date: 20210526

## 2021-03-31 ENCOUNTER — Inpatient Hospital Stay: Payer: Medicare PPO

## 2021-03-31 ENCOUNTER — Inpatient Hospital Stay: Payer: Medicare PPO | Admitting: Physician Assistant

## 2021-03-31 ENCOUNTER — Other Ambulatory Visit: Payer: Self-pay

## 2021-03-31 VITALS — BP 128/77 | HR 99 | Temp 98.7°F | Resp 18 | Wt 245.9 lb

## 2021-03-31 DIAGNOSIS — C3492 Malignant neoplasm of unspecified part of left bronchus or lung: Secondary | ICD-10-CM

## 2021-03-31 DIAGNOSIS — J189 Pneumonia, unspecified organism: Secondary | ICD-10-CM | POA: Diagnosis present

## 2021-03-31 DIAGNOSIS — Z885 Allergy status to narcotic agent status: Secondary | ICD-10-CM | POA: Diagnosis not present

## 2021-03-31 DIAGNOSIS — C3491 Malignant neoplasm of unspecified part of right bronchus or lung: Secondary | ICD-10-CM | POA: Diagnosis not present

## 2021-03-31 DIAGNOSIS — I10 Essential (primary) hypertension: Secondary | ICD-10-CM | POA: Diagnosis present

## 2021-03-31 DIAGNOSIS — G8929 Other chronic pain: Secondary | ICD-10-CM | POA: Diagnosis present

## 2021-03-31 DIAGNOSIS — Z923 Personal history of irradiation: Secondary | ICD-10-CM | POA: Diagnosis not present

## 2021-03-31 DIAGNOSIS — Z880 Allergy status to penicillin: Secondary | ICD-10-CM | POA: Diagnosis not present

## 2021-03-31 DIAGNOSIS — G4733 Obstructive sleep apnea (adult) (pediatric): Secondary | ICD-10-CM | POA: Diagnosis present

## 2021-03-31 DIAGNOSIS — Z888 Allergy status to other drugs, medicaments and biological substances status: Secondary | ICD-10-CM | POA: Diagnosis not present

## 2021-03-31 DIAGNOSIS — Z20822 Contact with and (suspected) exposure to covid-19: Secondary | ICD-10-CM | POA: Diagnosis present

## 2021-03-31 DIAGNOSIS — I480 Paroxysmal atrial fibrillation: Secondary | ICD-10-CM | POA: Diagnosis present

## 2021-03-31 DIAGNOSIS — C771 Secondary and unspecified malignant neoplasm of intrathoracic lymph nodes: Secondary | ICD-10-CM | POA: Diagnosis present

## 2021-03-31 DIAGNOSIS — Z5111 Encounter for antineoplastic chemotherapy: Secondary | ICD-10-CM

## 2021-03-31 DIAGNOSIS — J440 Chronic obstructive pulmonary disease with acute lower respiratory infection: Secondary | ICD-10-CM | POA: Diagnosis present

## 2021-03-31 DIAGNOSIS — K219 Gastro-esophageal reflux disease without esophagitis: Secondary | ICD-10-CM | POA: Diagnosis present

## 2021-03-31 DIAGNOSIS — J910 Malignant pleural effusion: Secondary | ICD-10-CM | POA: Diagnosis present

## 2021-03-31 DIAGNOSIS — R58 Hemorrhage, not elsewhere classified: Secondary | ICD-10-CM | POA: Diagnosis not present

## 2021-03-31 DIAGNOSIS — D6859 Other primary thrombophilia: Secondary | ICD-10-CM | POA: Diagnosis present

## 2021-03-31 DIAGNOSIS — Z66 Do not resuscitate: Secondary | ICD-10-CM | POA: Diagnosis present

## 2021-03-31 DIAGNOSIS — R059 Cough, unspecified: Secondary | ICD-10-CM | POA: Diagnosis not present

## 2021-03-31 DIAGNOSIS — Z7901 Long term (current) use of anticoagulants: Secondary | ICD-10-CM | POA: Diagnosis not present

## 2021-03-31 DIAGNOSIS — I2510 Atherosclerotic heart disease of native coronary artery without angina pectoris: Secondary | ICD-10-CM | POA: Diagnosis present

## 2021-03-31 DIAGNOSIS — Z8673 Personal history of transient ischemic attack (TIA), and cerebral infarction without residual deficits: Secondary | ICD-10-CM | POA: Diagnosis not present

## 2021-03-31 DIAGNOSIS — E669 Obesity, unspecified: Secondary | ICD-10-CM | POA: Diagnosis present

## 2021-03-31 DIAGNOSIS — Z5112 Encounter for antineoplastic immunotherapy: Secondary | ICD-10-CM

## 2021-03-31 DIAGNOSIS — R042 Hemoptysis: Secondary | ICD-10-CM | POA: Diagnosis present

## 2021-03-31 DIAGNOSIS — J441 Chronic obstructive pulmonary disease with (acute) exacerbation: Secondary | ICD-10-CM | POA: Diagnosis present

## 2021-03-31 DIAGNOSIS — F419 Anxiety disorder, unspecified: Secondary | ICD-10-CM | POA: Diagnosis present

## 2021-03-31 DIAGNOSIS — Z6831 Body mass index (BMI) 31.0-31.9, adult: Secondary | ICD-10-CM | POA: Diagnosis not present

## 2021-03-31 LAB — CMP (CANCER CENTER ONLY)
ALT: 12 U/L (ref 0–44)
AST: 14 U/L — ABNORMAL LOW (ref 15–41)
Albumin: 2.9 g/dL — ABNORMAL LOW (ref 3.5–5.0)
Alkaline Phosphatase: 43 U/L (ref 38–126)
Anion gap: 10 (ref 5–15)
BUN: 19 mg/dL (ref 8–23)
CO2: 20 mmol/L — ABNORMAL LOW (ref 22–32)
Calcium: 9.2 mg/dL (ref 8.9–10.3)
Chloride: 105 mmol/L (ref 98–111)
Creatinine: 0.85 mg/dL (ref 0.61–1.24)
GFR, Estimated: >60 mL/min (ref >60)
Glucose, Bld: 120 mg/dL — ABNORMAL HIGH (ref 70–99)
Potassium: 3.6 mmol/L (ref 3.5–5.1)
Sodium: 135 mmol/L (ref 135–145)
Total Bilirubin: 0.4 mg/dL (ref 0.3–1.2)
Total Protein: 6.4 g/dL — ABNORMAL LOW (ref 6.5–8.1)

## 2021-03-31 LAB — CBC WITH DIFFERENTIAL (CANCER CENTER ONLY)
Abs Immature Granulocytes: 0.06 10*3/uL (ref 0.00–0.07)
Basophils Absolute: 0 10*3/uL (ref 0.0–0.1)
Basophils Relative: 1 %
Eosinophils Absolute: 0.2 10*3/uL (ref 0.0–0.5)
Eosinophils Relative: 4 %
HCT: 32 % — ABNORMAL LOW (ref 39.0–52.0)
Hemoglobin: 10.5 g/dL — ABNORMAL LOW (ref 13.0–17.0)
Immature Granulocytes: 1 %
Lymphocytes Relative: 7 %
Lymphs Abs: 0.3 10*3/uL — ABNORMAL LOW (ref 0.7–4.0)
MCH: 29.8 pg (ref 26.0–34.0)
MCHC: 32.8 g/dL (ref 30.0–36.0)
MCV: 90.9 fL (ref 80.0–100.0)
Monocytes Absolute: 0.5 10*3/uL (ref 0.1–1.0)
Monocytes Relative: 11 %
Neutro Abs: 3.6 10*3/uL (ref 1.7–7.7)
Neutrophils Relative %: 76 %
Platelet Count: 311 10*3/uL (ref 150–400)
RBC: 3.52 MIL/uL — ABNORMAL LOW (ref 4.22–5.81)
RDW: 15.9 % — ABNORMAL HIGH (ref 11.5–15.5)
WBC Count: 4.8 10*3/uL (ref 4.0–10.5)
nRBC: 0 % (ref 0.0–0.2)

## 2021-03-31 LAB — TSH: TSH: 1.252 u[IU]/mL (ref 0.320–4.118)

## 2021-03-31 MED ORDER — SODIUM CHLORIDE 0.9 % IV SOLN
150.0000 mg | Freq: Once | INTRAVENOUS | Status: AC
  Administered 2021-03-31: 150 mg via INTRAVENOUS
  Filled 2021-03-31: qty 150

## 2021-03-31 MED ORDER — PALONOSETRON HCL INJECTION 0.25 MG/5ML
INTRAVENOUS | Status: AC
  Filled 2021-03-31: qty 5

## 2021-03-31 MED ORDER — SODIUM CHLORIDE 0.9 % IV SOLN
Freq: Once | INTRAVENOUS | Status: AC
Start: 2021-03-31 — End: 2021-03-31
  Filled 2021-03-31: qty 250

## 2021-03-31 MED ORDER — SODIUM CHLORIDE 0.9 % IV SOLN
10.0000 mg | Freq: Once | INTRAVENOUS | Status: AC
  Administered 2021-03-31: 10 mg via INTRAVENOUS
  Filled 2021-03-31: qty 10

## 2021-03-31 MED ORDER — PALONOSETRON HCL INJECTION 0.25 MG/5ML
0.2500 mg | Freq: Once | INTRAVENOUS | Status: AC
  Administered 2021-03-31: 0.25 mg via INTRAVENOUS

## 2021-03-31 MED ORDER — SODIUM CHLORIDE 0.9 % IV SOLN
623.0000 mg | Freq: Once | INTRAVENOUS | Status: AC
  Administered 2021-03-31: 620 mg via INTRAVENOUS
  Filled 2021-03-31: qty 62

## 2021-03-31 MED ORDER — SODIUM CHLORIDE 0.9 % IV SOLN
500.0000 mg/m2 | Freq: Once | INTRAVENOUS | Status: AC
  Administered 2021-03-31: 1200 mg via INTRAVENOUS
  Filled 2021-03-31: qty 40

## 2021-03-31 MED ORDER — SODIUM CHLORIDE 0.9 % IV SOLN
200.0000 mg | Freq: Once | INTRAVENOUS | Status: AC
  Administered 2021-03-31: 200 mg via INTRAVENOUS
  Filled 2021-03-31: qty 8

## 2021-03-31 NOTE — Patient Instructions (Addendum)
[facility]  Discharge Instructions: Thank you for choosing [facility] to provide your oncology and hematology care.   If you have a lab appointment with the [facility], please go directly to the [facility] and check in at the registration area.   Wear comfortable clothing and clothing appropriate for easy access to any Portacath or PICC line.   We strive to give you quality time with your provider. You may need to reschedule your appointment if you arrive late (15 or more minutes).  Arriving late affects you and other patients whose appointments are after yours.  Also, if you miss three or more appointments without notifying the office, you may be dismissed from the clinic at the provider's discretion.      For prescription refill requests, have your pharmacy contact our office and allow 72 hours for refills to be completed.    Today you received the following chemotherapy and/or immunotherapy agents: Keytruda/Alimta/Carboplatin.      To help prevent nausea and vomiting after your treatment, we encourage you to take your nausea medication as directed.  BELOW ARE SYMPTOMS THAT SHOULD BE REPORTED IMMEDIATELY: *FEVER GREATER THAN 100.4 F (38 C) OR HIGHER *CHILLS OR SWEATING *NAUSEA AND VOMITING THAT IS NOT CONTROLLED WITH YOUR NAUSEA MEDICATION *UNUSUAL SHORTNESS OF BREATH *UNUSUAL BRUISING OR BLEEDING *URINARY PROBLEMS (pain or burning when urinating, or frequent urination) *BOWEL PROBLEMS (unusual diarrhea, constipation, pain near the anus) TENDERNESS IN MOUTH AND THROAT WITH OR WITHOUT PRESENCE OF ULCERS (sore throat, sores in mouth, or a toothache) UNUSUAL RASH, SWELLING OR PAIN  UNUSUAL VAGINAL DISCHARGE OR ITCHING   Items with * indicate a potential emergency and should be followed up as soon as possible or go to the Emergency Department if any problems should occur.  Please show the CHEMOTHERAPY ALERT CARD or IMMUNOTHERAPY ALERT  CARD at check-in to the Emergency Department and triage nurse.  Should you have questions after your visit or need to cancel or reschedule your appointment, please contact [facility]  Dept: [phone]  and follow the prompts.  Office hours are 8:00 a.m. to 4:30 p.m. Monday - Friday. Please note that voicemails left after 4:00 p.m. may not be returned until the following business day.  We are closed weekends and major holidays. You have access to a nurse at all times for urgent questions. Please call the main number to the clinic Dept: [phone] and follow the prompts.   For any non-urgent questions, you may also contact your provider using MyChart. We now offer e-Visits for anyone 89 and older to request care online for non-urgent symptoms. For details visit [email or web address].   Also download the MyChart app! Go to the app store, search "MyChart", open the app, select [facility], and log in with your MyChart username and password.  Due to Covid, a mask is required upon entering the hospital/clinic. If you do not have a mask, one will be given to you upon arrival. For doctor visits, patients may have 1 support person aged 36 or older with them. For treatment visits, patients cannot have anyone with them due to current Covid guidelines and our immunocompromised population.   Pembrolizumab injection What is this medication? PEMBROLIZUMAB (pem broe liz ue mab) is a monoclonal antibody. It is used totreat certain types of cancer. This medicine may be used for other purposes; ask your health care provider orpharmacist if you have questions. COMMON BRAND NAME(S): Keytruda What should I  tell my care team before I take this medication? They need to know if you have any of these conditions: autoimmune diseases like Crohn's disease, ulcerative colitis, or lupus have had or planning to have an allogeneic stem cell transplant (uses someone else's stem cells) history  of organ transplant history of chest radiation nervous system problems like myasthenia gravis or Guillain-Barre syndrome an unusual or allergic reaction to pembrolizumab, other medicines, foods, dyes, or preservatives pregnant or trying to get pregnant breast-feeding How should I use this medication? This medicine is for infusion into a vein. It is given by a health careprofessional in a hospital or clinic setting. A special MedGuide will be given to you before each treatment. Be sure to readthis information carefully each time. Talk to your pediatrician regarding the use of this medicine in children. While this drug may be prescribed for children as young as 6 months for selectedconditions, precautions do apply. Overdosage: If you think you have taken too much of this medicine contact apoison control center or emergency room at once. NOTE: This medicine is only for you. Do not share this medicine with others. What if I miss a dose? It is important not to miss your dose. Call your doctor or health careprofessional if you are unable to keep an appointment. What may interact with this medication? Interactions have not been studied. This list may not describe all possible interactions. Give your health care provider a list of all the medicines, herbs, non-prescription drugs, or dietary supplements you use. Also tell them if you smoke, drink alcohol, or use illegaldrugs. Some items may interact with your medicine. What should I watch for while using this medication? Your condition will be monitored carefully while you are receiving thismedicine. You may need blood work done while you are taking this medicine. Do not become pregnant while taking this medicine or for 4 months after stopping it. Women should inform their doctor if they wish to become pregnant or think they might be pregnant. There is a potential for serious side effects to an unborn child. Talk to your health care professional or  pharmacist for more information. Do not breast-feed an infant while taking this medicine orfor 4 months after the last dose. What side effects may I notice from receiving this medication? Side effects that you should report to your doctor or health care professionalas soon as possible: allergic reactions like skin rash, itching or hives, swelling of the face, lips, or tongue bloody or black, tarry breathing problems changes in vision chest pain chills confusion constipation cough diarrhea dizziness or feeling faint or lightheaded fast or irregular heartbeat fever flushing joint pain low blood counts - this medicine may decrease the number of white blood cells, red blood cells and platelets. You may be at increased risk for infections and bleeding. muscle pain muscle weakness pain, tingling, numbness in the hands or feet persistent headache redness, blistering, peeling or loosening of the skin, including inside the mouth signs and symptoms of high blood sugar such as dizziness; dry mouth; dry skin; fruity breath; nausea; stomach pain; increased hunger or thirst; increased urination signs and symptoms of kidney injury like trouble passing urine or change in the amount of urine signs and symptoms of liver injury like dark urine, light-colored stools, loss of appetite, nausea, right upper belly pain, yellowing of the eyes or skin sweating swollen lymph nodes weight loss Side effects that usually do not require medical attention (report to yourdoctor or health care professional if they continue  or are bothersome): decreased appetite hair loss tiredness This list may not describe all possible side effects. Call your doctor for medical advice about side effects. You may report side effects to FDA at1-800-FDA-1088. Where should I keep my medication? This drug is given in a hospital or clinic and will not be stored at home. NOTE: This sheet is a summary. It may not cover all possible  information. If you have questions about this medicine, talk to your doctor, pharmacist, orhealth care provider.  2022 Elsevier/Gold Standard (2019-08-22 21:44:53)  Pemetrexed injection What is this medication? PEMETREXED (PEM e TREX ed) is a chemotherapy drug used to treat lung cancers like non-small cell lung cancer and mesothelioma. It may also be used to treatother cancers. This medicine may be used for other purposes; ask your health care provider orpharmacist if you have questions. COMMON BRAND NAME(S): Alimta What should I tell my care team before I take this medication? They need to know if you have any of these conditions: infection (especially a virus infection such as chickenpox, cold sores, or herpes) kidney disease low blood counts, like low white cell, platelet, or red cell counts lung or breathing disease, like asthma radiation therapy an unusual or allergic reaction to pemetrexed, other medicines, foods, dyes, or preservative pregnant or trying to get pregnant breast-feeding How should I use this medication? This drug is given as an infusion into a vein. It is administered in a hospitalor clinic by a specially trained health care professional. Talk to your pediatrician regarding the use of this medicine in children.Special care may be needed. Overdosage: If you think you have taken too much of this medicine contact apoison control center or emergency room at once. NOTE: This medicine is only for you. Do not share this medicine with others. What if I miss a dose? It is important not to miss your dose. Call your doctor or health careprofessional if you are unable to keep an appointment. What may interact with this medication? This medicine may interact with the following medications: Ibuprofen This list may not describe all possible interactions. Give your health care provider a list of all the medicines, herbs, non-prescription drugs, or dietary supplements you use. Also  tell them if you smoke, drink alcohol, or use illegaldrugs. Some items may interact with your medicine. What should I watch for while using this medication? Visit your doctor for checks on your progress. This drug may make you feel generally unwell. This is not uncommon, as chemotherapy can affect healthy cells as well as cancer cells. Report any side effects. Continue your course oftreatment even though you feel ill unless your doctor tells you to stop. In some cases, you may be given additional medicines to help with side effects.Follow all directions for their use. Call your doctor or health care professional for advice if you get a fever, chills or sore throat, or other symptoms of a cold or flu. Do not treat yourself. This drug decreases your body's ability to fight infections. Try toavoid being around people who are sick. This medicine may increase your risk to bruise or bleed. Call your doctor orhealth care professional if you notice any unusual bleeding. Be careful brushing and flossing your teeth or using a toothpick because you may get an infection or bleed more easily. If you have any dental work done,tell your dentist you are receiving this medicine. Avoid taking products that contain aspirin, acetaminophen, ibuprofen, naproxen, or ketoprofen unless instructed by your doctor. These medicines may hide afever.  Call your doctor or health care professional if you get diarrhea or mouthsores. Do not treat yourself. To protect your kidneys, drink water or other fluids as directed while you aretaking this medicine. Do not become pregnant while taking this medicine or for 6 months after stopping it. Women should inform their doctor if they wish to become pregnant or think they might be pregnant. Men should not father a child while taking this medicine and for 3 months after stopping it. This may interfere with the ability to father a child. You should talk to your doctor or health care professional if  you are concerned about your fertility. There is a potential for serious side effects to an unborn child. Talk to your health care professional or pharmacist for more information. Do not breast-feed an infantwhile taking this medicine or for 1 week after stopping it. What side effects may I notice from receiving this medication? Side effects that you should report to your doctor or health care professionalas soon as possible: allergic reactions like skin rash, itching or hives, swelling of the face, lips, or tongue breathing problems redness, blistering, peeling or loosening of the skin, including inside the mouth signs and symptoms of bleeding such as bloody or black, tarry stools; red or dark-brown urine; spitting up blood or brown material that looks like coffee grounds; red spots on the skin; unusual bruising or bleeding from the eye, gums, or nose signs and symptoms of infection like fever or chills; cough; sore throat; pain or trouble passing urine signs and symptoms of kidney injury like trouble passing urine or change in the amount of urine signs and symptoms of liver injury like dark yellow or brown urine; general ill feeling or flu-like symptoms; light-colored stools; loss of appetite; nausea; right upper belly pain; unusually weak or tired; yellowing of the eyes or skin Side effects that usually do not require medical attention (report to yourdoctor or health care professional if they continue or are bothersome): constipation mouth sores nausea, vomiting unusually weak or tired This list may not describe all possible side effects. Call your doctor for medical advice about side effects. You may report side effects to FDA at1-800-FDA-1088. Where should I keep my medication? This drug is given in a hospital or clinic and will not be stored at home. NOTE: This sheet is a summary. It may not cover all possible information. If you have questions about this medicine, talk to your doctor,  pharmacist, orhealth care provider.  2022 Elsevier/Gold Standard (2017-11-09 16:11:33)  Carboplatin injection What is this medication? CARBOPLATIN (KAR boe pla tin) is a chemotherapy drug. It targets fast dividing cells, like cancer cells, and causes these cells to die. This medicine is usedto treat ovarian cancer and many other cancers. This medicine may be used for other purposes; ask your health care provider orpharmacist if you have questions. COMMON BRAND NAME(S): Paraplatin What should I tell my care team before I take this medication? They need to know if you have any of these conditions: blood disorders hearing problems kidney disease recent or ongoing radiation therapy an unusual or allergic reaction to carboplatin, cisplatin, other chemotherapy, other medicines, foods, dyes, or preservatives pregnant or trying to get pregnant breast-feeding How should I use this medication? This drug is usually given as an infusion into a vein. It is administered in [facility] or clinic by a specially trained health care professional. Talk to your pediatrician regarding the use of this medicine in children.Special care may be needed. Overdosage: If  you think you have taken too much of this medicine contact apoison control center or emergency room at once. NOTE: This medicine is only for you. Do not share this medicine with others. What if I miss a dose? It is important not to miss a dose. Call your doctor or health careprofessional if you are unable to keep an appointment. What may interact with this medication? medicines for seizures medicines to increase blood counts like filgrastim, pegfilgrastim, sargramostim some antibiotics like amikacin, gentamicin, neomycin, streptomycin, tobramycin vaccines Talk to your doctor or health care professional before taking any of thesemedicines: acetaminophen aspirin ibuprofen ketoprofen naproxen This list may not describe all possible interactions.  Give your health care provider a list of all the medicines, herbs, non-prescription drugs, or dietary supplements you use. Also tell them if you smoke, drink alcohol, or use illegaldrugs. Some items may interact with your medicine. What should I watch for while using this medication? Your condition will be monitored carefully while you are receiving this medicine. You will need important blood work done while you are taking thismedicine. This drug may make you feel generally unwell. This is not uncommon, as chemotherapy can affect healthy cells as well as cancer cells. Report any side effects. Continue your course of treatment even though you feel ill unless yourdoctor tells you to stop. In some cases, you may be given additional medicines to help with side effects.Follow all directions for their use. Call your doctor or health care professional for advice if you get a fever, chills or sore throat, or other symptoms of a cold or flu. Do not treat yourself. This drug decreases your body's ability to fight infections. Try toavoid being around people who are sick. This medicine may increase your risk to bruise or bleed. Call your doctor orhealth care professional if you notice any unusual bleeding. Be careful brushing and flossing your teeth or using a toothpick because you may get an infection or bleed more easily. If you have any dental work done,tell your dentist you are receiving this medicine. Avoid taking products that contain aspirin, acetaminophen, ibuprofen, naproxen, or ketoprofen unless instructed by your doctor. These medicines may hide afever. Do not become pregnant while taking this medicine. Women should inform their doctor if they wish to become pregnant or think they might be pregnant. There is a potential for serious side effects to an unborn child. Talk to your health care professional or pharmacist for more information. Do not breast-feed aninfant while taking this medicine. What side  effects may I notice from receiving this medication? Side effects that you should report to your doctor or health care professionalas soon as possible: allergic reactions like skin rash, itching or hives, swelling of the face, lips, or tongue signs of infection - fever or chills, cough, sore throat, pain or difficulty passing urine signs of decreased platelets or bleeding - bruising, pinpoint red spots on the skin, black, tarry stools, nosebleeds signs of decreased red blood cells - unusually weak or tired, fainting spells, lightheadedness breathing problems changes in hearing changes in vision chest pain high blood pressure low blood counts - This drug may decrease the number of white blood cells, red blood cells and platelets. You may be at increased risk for infections and bleeding. nausea and vomiting pain, swelling, redness or irritation at the injection site pain, tingling, numbness in the hands or feet problems with balance, talking, walking trouble passing urine or change in the amount of urine Side effects that usually do not  require medical attention (report to yourdoctor or health care professional if they continue or are bothersome): hair loss loss of appetite metallic taste in the mouth or changes in taste This list may not describe all possible side effects. Call your doctor for medical advice about side effects. You may report side effects to FDA at1-800-FDA-1088. Where should I keep my medication? This drug is given in a hospital or clinic and will not be stored at home. NOTE: This sheet is a summary. It may not cover all possible information. If you have questions about this medicine, talk to your doctor, pharmacist, orhealth care provider.  2022 Elsevier/Gold Standard (2007-12-26 14:38:05)

## 2021-04-01 ENCOUNTER — Encounter (HOSPITAL_COMMUNITY): Payer: Self-pay | Admitting: Internal Medicine

## 2021-04-01 ENCOUNTER — Other Ambulatory Visit: Payer: Self-pay

## 2021-04-01 ENCOUNTER — Emergency Department (HOSPITAL_COMMUNITY): Payer: Medicare PPO

## 2021-04-01 ENCOUNTER — Other Ambulatory Visit: Payer: Self-pay | Admitting: Adult Health

## 2021-04-01 ENCOUNTER — Inpatient Hospital Stay (HOSPITAL_COMMUNITY)
Admission: EM | Admit: 2021-04-01 | Discharge: 2021-04-04 | DRG: 190 | Disposition: A | Discharge status: Home / Self Care | Payer: Medicare PPO | Attending: Internal Medicine | Admitting: Internal Medicine

## 2021-04-01 DIAGNOSIS — Z66 Do not resuscitate: Secondary | ICD-10-CM | POA: Diagnosis present

## 2021-04-01 DIAGNOSIS — E669 Obesity, unspecified: Secondary | ICD-10-CM | POA: Diagnosis present

## 2021-04-01 DIAGNOSIS — J189 Pneumonia, unspecified organism: Secondary | ICD-10-CM | POA: Diagnosis present

## 2021-04-01 DIAGNOSIS — Z6831 Body mass index (BMI) 31.0-31.9, adult: Secondary | ICD-10-CM

## 2021-04-01 DIAGNOSIS — Z888 Allergy status to other drugs, medicaments and biological substances status: Secondary | ICD-10-CM

## 2021-04-01 DIAGNOSIS — I2510 Atherosclerotic heart disease of native coronary artery without angina pectoris: Secondary | ICD-10-CM | POA: Diagnosis present

## 2021-04-01 DIAGNOSIS — G4733 Obstructive sleep apnea (adult) (pediatric): Secondary | ICD-10-CM | POA: Diagnosis present

## 2021-04-01 DIAGNOSIS — D6859 Other primary thrombophilia: Secondary | ICD-10-CM | POA: Diagnosis present

## 2021-04-01 DIAGNOSIS — Z8673 Personal history of transient ischemic attack (TIA), and cerebral infarction without residual deficits: Secondary | ICD-10-CM

## 2021-04-01 DIAGNOSIS — M5416 Radiculopathy, lumbar region: Secondary | ICD-10-CM | POA: Diagnosis present

## 2021-04-01 DIAGNOSIS — Z96652 Presence of left artificial knee joint: Secondary | ICD-10-CM | POA: Diagnosis present

## 2021-04-01 DIAGNOSIS — Z923 Personal history of irradiation: Secondary | ICD-10-CM

## 2021-04-01 DIAGNOSIS — Z8042 Family history of malignant neoplasm of prostate: Secondary | ICD-10-CM

## 2021-04-01 DIAGNOSIS — Z880 Allergy status to penicillin: Secondary | ICD-10-CM

## 2021-04-01 DIAGNOSIS — Z87891 Personal history of nicotine dependence: Secondary | ICD-10-CM

## 2021-04-01 DIAGNOSIS — J910 Malignant pleural effusion: Secondary | ICD-10-CM | POA: Diagnosis present

## 2021-04-01 DIAGNOSIS — Z8249 Family history of ischemic heart disease and other diseases of the circulatory system: Secondary | ICD-10-CM

## 2021-04-01 DIAGNOSIS — K219 Gastro-esophageal reflux disease without esophagitis: Secondary | ICD-10-CM | POA: Diagnosis present

## 2021-04-01 DIAGNOSIS — Z801 Family history of malignant neoplasm of trachea, bronchus and lung: Secondary | ICD-10-CM

## 2021-04-01 DIAGNOSIS — C771 Secondary and unspecified malignant neoplasm of intrathoracic lymph nodes: Secondary | ICD-10-CM | POA: Diagnosis present

## 2021-04-01 DIAGNOSIS — Z885 Allergy status to narcotic agent status: Secondary | ICD-10-CM

## 2021-04-01 DIAGNOSIS — I480 Paroxysmal atrial fibrillation: Secondary | ICD-10-CM | POA: Diagnosis not present

## 2021-04-01 DIAGNOSIS — R042 Hemoptysis: Secondary | ICD-10-CM | POA: Diagnosis not present

## 2021-04-01 DIAGNOSIS — Z7901 Long term (current) use of anticoagulants: Secondary | ICD-10-CM

## 2021-04-01 DIAGNOSIS — J440 Chronic obstructive pulmonary disease with acute lower respiratory infection: Principal | ICD-10-CM | POA: Diagnosis present

## 2021-04-01 DIAGNOSIS — F419 Anxiety disorder, unspecified: Secondary | ICD-10-CM | POA: Diagnosis present

## 2021-04-01 DIAGNOSIS — C3492 Malignant neoplasm of unspecified part of left bronchus or lung: Secondary | ICD-10-CM | POA: Diagnosis present

## 2021-04-01 DIAGNOSIS — J441 Chronic obstructive pulmonary disease with (acute) exacerbation: Secondary | ICD-10-CM | POA: Diagnosis not present

## 2021-04-01 DIAGNOSIS — Z20822 Contact with and (suspected) exposure to covid-19: Secondary | ICD-10-CM | POA: Diagnosis not present

## 2021-04-01 DIAGNOSIS — G8929 Other chronic pain: Secondary | ICD-10-CM | POA: Diagnosis present

## 2021-04-01 DIAGNOSIS — Z79899 Other long term (current) drug therapy: Secondary | ICD-10-CM

## 2021-04-01 DIAGNOSIS — I10 Essential (primary) hypertension: Secondary | ICD-10-CM | POA: Diagnosis present

## 2021-04-01 DIAGNOSIS — E785 Hyperlipidemia, unspecified: Secondary | ICD-10-CM | POA: Diagnosis present

## 2021-04-01 DIAGNOSIS — Z955 Presence of coronary angioplasty implant and graft: Secondary | ICD-10-CM

## 2021-04-01 DIAGNOSIS — Z8546 Personal history of malignant neoplasm of prostate: Secondary | ICD-10-CM

## 2021-04-01 LAB — CBC
HCT: 30.9 % — ABNORMAL LOW (ref 39.0–52.0)
HCT: 31.2 % — ABNORMAL LOW (ref 39.0–52.0)
Hemoglobin: 9.6 g/dL — ABNORMAL LOW (ref 13.0–17.0)
Hemoglobin: 9.8 g/dL — ABNORMAL LOW (ref 13.0–17.0)
MCH: 28.9 pg (ref 26.0–34.0)
MCH: 29.6 pg (ref 26.0–34.0)
MCHC: 31.1 g/dL (ref 30.0–36.0)
MCHC: 31.4 g/dL (ref 30.0–36.0)
MCV: 93.1 fL (ref 80.0–100.0)
MCV: 94.3 fL (ref 80.0–100.0)
Platelets: 325 10*3/uL (ref 150–400)
Platelets: 315 10*3/uL (ref 150–400)
RBC: 3.32 MIL/uL — ABNORMAL LOW (ref 4.22–5.81)
RBC: 3.31 MIL/uL — ABNORMAL LOW (ref 4.22–5.81)
RDW: 15.8 % — ABNORMAL HIGH (ref 11.5–15.5)
RDW: 15.9 % — ABNORMAL HIGH (ref 11.5–15.5)
WBC: 4.3 10*3/uL (ref 4.0–10.5)
WBC: 5.6 10*3/uL (ref 4.0–10.5)
nRBC: 0 % (ref 0.0–0.2)
nRBC: 0 % (ref 0.0–0.2)

## 2021-04-01 LAB — BASIC METABOLIC PANEL
Anion gap: 9 (ref 5–15)
BUN: 17 mg/dL (ref 8–23)
CO2: 21 mmol/L — ABNORMAL LOW (ref 22–32)
Calcium: 9.3 mg/dL (ref 8.9–10.3)
Chloride: 105 mmol/L (ref 98–111)
Creatinine, Ser: 0.61 mg/dL (ref 0.61–1.24)
GFR, Estimated: >60 mL/min (ref >60)
Glucose, Bld: 126 mg/dL — ABNORMAL HIGH (ref 70–99)
Potassium: 4.1 mmol/L (ref 3.5–5.1)
Sodium: 135 mmol/L (ref 135–145)

## 2021-04-01 LAB — RESP PANEL BY RT-PCR (FLU A&B, COVID) ARPGX2
Influenza A by PCR: NEGATIVE
Influenza B by PCR: NEGATIVE
SARS Coronavirus 2 by RT PCR: NEGATIVE

## 2021-04-01 LAB — PROTIME-INR
INR: 1.4 — ABNORMAL HIGH (ref 0.8–1.2)
Prothrombin Time: 17.5 seconds — ABNORMAL HIGH (ref 11.4–15.2)

## 2021-04-01 LAB — EXPECTORATED SPUTUM ASSESSMENT W GRAM STAIN, RFLX TO RESP C

## 2021-04-01 LAB — LACTIC ACID, PLASMA: Lactic Acid, Venous: 1.1 mmol/L (ref 0.5–1.9)

## 2021-04-01 MED ORDER — METOPROLOL SUCCINATE ER 25 MG PO TB24
25.0000 mg | ORAL_TABLET | Freq: Every day | ORAL | Status: DC
  Administered 2021-04-01 – 2021-04-04 (×4): 25 mg via ORAL
  Filled 2021-04-01 (×4): qty 1

## 2021-04-01 MED ORDER — SACCHAROMYCES BOULARDII 250 MG PO CAPS
250.0000 mg | ORAL_CAPSULE | Freq: Every day | ORAL | Status: DC
  Administered 2021-04-02 – 2021-04-04 (×3): 250 mg via ORAL
  Filled 2021-04-01 (×3): qty 1

## 2021-04-01 MED ORDER — ONDANSETRON HCL 4 MG PO TABS: 4.0000 mg | ORAL_TABLET | Freq: Four times a day (QID) | ORAL | Status: DC | PRN

## 2021-04-01 MED ORDER — POTASSIUM CHLORIDE CRYS ER 10 MEQ PO TBCR
10.0000 meq | EXTENDED_RELEASE_TABLET | Freq: Every day | ORAL | Status: DC
  Administered 2021-04-01 – 2021-04-04 (×4): 10 meq via ORAL
  Filled 2021-04-01 (×4): qty 1

## 2021-04-01 MED ORDER — FENOFIBRATE 54 MG PO TABS
54.0000 mg | ORAL_TABLET | Freq: Every day | ORAL | Status: DC
  Administered 2021-04-02 – 2021-04-04 (×3): 54 mg via ORAL
  Filled 2021-04-01 (×4): qty 1

## 2021-04-01 MED ORDER — TRANEXAMIC ACID FOR INHALATION
500.0000 mg | Freq: Three times a day (TID) | RESPIRATORY_TRACT | Status: DC
  Administered 2021-04-01 – 2021-04-02 (×4): 500 mg via RESPIRATORY_TRACT
  Filled 2021-04-01 (×6): qty 10

## 2021-04-01 MED ORDER — TRAMADOL HCL 50 MG PO TABS: 50.0000 mg | ORAL_TABLET | Freq: Four times a day (QID) | ORAL | Status: DC | PRN

## 2021-04-01 MED ORDER — SODIUM CHLORIDE 0.9 % IV SOLN
2.0000 g | INTRAVENOUS | Status: DC
  Administered 2021-04-02 – 2021-04-03 (×2): 2 g via INTRAVENOUS
  Filled 2021-04-01 (×2): qty 2

## 2021-04-01 MED ORDER — IPRATROPIUM-ALBUTEROL 0.5-2.5 (3) MG/3ML IN SOLN
3.0000 mL | Freq: Four times a day (QID) | RESPIRATORY_TRACT | Status: DC
  Administered 2021-04-01 – 2021-04-03 (×7): 3 mL via RESPIRATORY_TRACT
  Filled 2021-04-01 (×7): qty 3

## 2021-04-01 MED ORDER — SODIUM CHLORIDE 0.9 % IV SOLN
100.0000 mg | Freq: Two times a day (BID) | INTRAVENOUS | Status: DC
  Administered 2021-04-02 – 2021-04-03 (×4): 100 mg via INTRAVENOUS
  Filled 2021-04-01 (×5): qty 100

## 2021-04-01 MED ORDER — ONDANSETRON HCL 4 MG/2ML IJ SOLN
4.0000 mg | Freq: Four times a day (QID) | INTRAMUSCULAR | Status: DC | PRN
  Filled 2021-04-01: qty 2

## 2021-04-01 MED ORDER — TRAZODONE HCL 50 MG PO TABS: 25.0000 mg | ORAL_TABLET | Freq: Every evening | ORAL | Status: DC | PRN

## 2021-04-01 MED ORDER — SODIUM CHLORIDE 0.9% FLUSH
3.0000 mL | Freq: Two times a day (BID) | INTRAVENOUS | Status: DC
  Administered 2021-04-01 – 2021-04-03 (×5): 3 mL via INTRAVENOUS

## 2021-04-01 MED ORDER — ADULT MULTIVITAMIN W/MINERALS CH
1.0000 | ORAL_TABLET | Freq: Every day | ORAL | Status: DC
  Administered 2021-04-01 – 2021-04-04 (×4): 1 via ORAL
  Filled 2021-04-01 (×4): qty 1

## 2021-04-01 MED ORDER — VITAMIN D (ERGOCALCIFEROL) 1.25 MG (50000 UNIT) PO CAPS: 50000.0000 [IU] | ORAL_CAPSULE | ORAL | Status: DC

## 2021-04-01 MED ORDER — DICYCLOMINE HCL 20 MG PO TABS: 20.0000 mg | ORAL_TABLET | Freq: Every day | ORAL | Status: DC | PRN

## 2021-04-01 MED ORDER — IOHEXOL 350 MG/ML SOLN
100.0000 mL | Freq: Once | INTRAVENOUS | Status: AC | PRN
  Administered 2021-04-01: 100 mL via INTRAVENOUS

## 2021-04-01 MED ORDER — DOFETILIDE 250 MCG PO CAPS
500.0000 ug | ORAL_CAPSULE | Freq: Two times a day (BID) | ORAL | Status: DC
  Administered 2021-04-01 – 2021-04-04 (×6): 500 ug via ORAL
  Filled 2021-04-01 (×7): qty 2

## 2021-04-01 MED ORDER — SUCRALFATE 1 GM/10ML PO SUSP
1.0000 g | Freq: Three times a day (TID) | ORAL | Status: DC
  Administered 2021-04-01 – 2021-04-04 (×12): 1 g via ORAL
  Filled 2021-04-01 (×12): qty 10

## 2021-04-01 MED ORDER — FOLIC ACID 1 MG PO TABS
1.0000 mg | ORAL_TABLET | Freq: Every day | ORAL | Status: DC
  Administered 2021-04-01 – 2021-04-04 (×4): 1 mg via ORAL
  Filled 2021-04-01 (×4): qty 1

## 2021-04-01 MED ORDER — SODIUM CHLORIDE (PF) 0.9 % IJ SOLN
INTRAMUSCULAR | Status: AC
  Filled 2021-04-01: qty 50

## 2021-04-01 MED ORDER — ATORVASTATIN CALCIUM 40 MG PO TABS
40.0000 mg | ORAL_TABLET | Freq: Every day | ORAL | Status: DC
  Administered 2021-04-02 – 2021-04-04 (×3): 40 mg via ORAL
  Filled 2021-04-01 (×4): qty 1

## 2021-04-01 MED ORDER — MAGNESIUM OXIDE -MG SUPPLEMENT 400 (240 MG) MG PO TABS
400.0000 mg | ORAL_TABLET | Freq: Every day | ORAL | Status: DC
  Administered 2021-04-01 – 2021-04-04 (×4): 400 mg via ORAL
  Filled 2021-04-01 (×4): qty 1

## 2021-04-01 MED ORDER — ACETAMINOPHEN 325 MG PO TABS: 650.0000 mg | ORAL_TABLET | ORAL | Status: DC | PRN

## 2021-04-01 MED ORDER — SODIUM CHLORIDE 0.9 % IV SOLN
1.0000 g | Freq: Once | INTRAVENOUS | Status: AC
  Administered 2021-04-01: 1 g via INTRAVENOUS
  Filled 2021-04-01: qty 10

## 2021-04-01 MED ORDER — SODIUM CHLORIDE 0.9 % IV SOLN: 250.0000 mL | INTRAVENOUS | Status: DC | PRN

## 2021-04-01 MED ORDER — PREDNISONE 20 MG PO TABS
40.0000 mg | ORAL_TABLET | Freq: Every day | ORAL | Status: DC
  Administered 2021-04-02: 40 mg via ORAL
  Filled 2021-04-01: qty 2

## 2021-04-01 MED ORDER — TOPIRAMATE 100 MG PO TABS
100.0000 mg | ORAL_TABLET | Freq: Two times a day (BID) | ORAL | Status: DC
  Administered 2021-04-01 – 2021-04-04 (×7): 100 mg via ORAL
  Filled 2021-04-01 (×7): qty 1

## 2021-04-01 MED ORDER — TAMSULOSIN HCL 0.4 MG PO CAPS
0.4000 mg | ORAL_CAPSULE | Freq: Every day | ORAL | Status: DC
  Administered 2021-04-01 – 2021-04-04 (×4): 0.4 mg via ORAL
  Filled 2021-04-01 (×4): qty 1

## 2021-04-01 MED ORDER — FLUTICASONE PROPIONATE 50 MCG/ACT NA SUSP
2.0000 | Freq: Every day | NASAL | Status: DC
  Administered 2021-04-02 – 2021-04-04 (×3): 2 via NASAL
  Filled 2021-04-01: qty 16

## 2021-04-01 MED ORDER — SODIUM CHLORIDE 0.9 % IV SOLN
500.0000 mg | Freq: Once | INTRAVENOUS | Status: AC
  Administered 2021-04-01: 500 mg via INTRAVENOUS
  Filled 2021-04-01: qty 500

## 2021-04-01 MED ORDER — SODIUM CHLORIDE 0.9% FLUSH: 3.0000 mL | INTRAVENOUS | Status: DC | PRN

## 2021-04-01 MED ORDER — CARBINOXAMINE MALEATE 6 MG PO TABS: 6.0000 mg | ORAL_TABLET | Freq: Two times a day (BID) | ORAL | Status: DC | PRN

## 2021-04-01 MED ORDER — NITROGLYCERIN 0.4 MG SL SUBL: 0.4000 mg | SUBLINGUAL_TABLET | SUBLINGUAL | Status: DC | PRN

## 2021-04-01 MED ORDER — PANTOPRAZOLE SODIUM 40 MG PO TBEC
40.0000 mg | DELAYED_RELEASE_TABLET | Freq: Every day | ORAL | Status: DC
  Administered 2021-04-01 – 2021-04-04 (×4): 40 mg via ORAL
  Filled 2021-04-01 (×4): qty 1

## 2021-04-01 MED ORDER — SODIUM CHLORIDE 0.9 % IV SOLN: 500.0000 mg | INTRAVENOUS | Status: DC

## 2021-04-01 NOTE — ED Notes (Signed)
Pulmonary at the bedside.

## 2021-04-01 NOTE — ED Notes (Signed)
Pharmacy verifying medications at this time.

## 2021-04-01 NOTE — Progress Notes (Signed)
Spoke with RN Lovena Le about repeating the covid test with a rapid test. She said she would. Waiting on RN to swab pts and results.

## 2021-04-01 NOTE — H&P (Addendum)
History and Physical    DAILY CRATE GDJ:[id number] DOB: Jan 17, 1946 DOA: 04/01/2021  PCP: [name], MD   Patient coming from: Home  I have personally briefly reviewed patient's old medical records in [place]  Chief Complaint: Coughing up blood  HPI: Corey [name] is a 75 y.o. 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 Patient's first chemotherapy session was on 03/31/21. He presents to the emergency room via EMS for evaluation of hemoptysis which started in the early hours of the morning.  Patient had 3 episodes of hemoptysis which she describes as bright red blood about 1 tablespoon each episode.  He denies having any fever or chills and denies having any known sick contacts.  He has a cough which is occasionally productive of clear phlegm.  He also complains of symptoms related to heartburn for which he takes Carafate and Prilosec. He denies having any chest pain, no shortness of breath, no dizziness, no lightheadedness, no headache, no abdominal pain, no changes in his bowel habits, no urinary symptoms, no palpitations, no dizziness, no headache, no focal deficits. Labs show sodium 135, potassium 4.1, chloride 105, bicarb 21, glucose 126, BUN 17, creatinine 0.61, calcium 9.3, lactic acid 1.1, white count 4.3, hemoglobin 9.6, hematocrit 30.9, MCV 93.1, RDW 15.8, platelet count 325 CT angiogram of the chest is negative for pulmonary embolism but shows interval development of ground-glass and reticular opacities in the left lower lobe as could be seen with progressive lymphangitic carcinomatosis secondary to known advanced stage left lower lobe lung mass. Superimposed pneumonia could appear  similarly. Overall similar appearance of poorly defined, extensive central left lower lobe mass and associated left basilar pleural thickening and scattered left upper and lower lobe pleural nodularity. Similar appearing hypoattenuating caudate lobe mass and left mediastinal lymphadenopathy compatible with known metastatic disease. Twelve-lead EKG reviewed by me shows sinus rhythm with prolonged QT interval.   ED Course: Patient is a 75 year old male who has a history of stage IV non-small cell cancer status post radiation therapy and initiation of chemotherapy with first treatment on 03/31/21.  He presents to the emergency room for evaluation of hemoptysis. He had a CT angiogram which is negative for pulmonary embolism but shows reticular opacities in the left lower lobe as could be seen with progressive lymphangitic carcinomatosis.  Superimposed pneumonia could not be ruled out. He received IV Rocephin and Zithromax in the ER and will be placed in observation status for further evaluation.     Review of Systems: As per HPI otherwise all other systems reviewed and negative.    Past Medical History:  Diagnosis Date   Anemia    Anxiety    Arthritis    "back, right knee, hands, ankles, neck" (05/31/2016)   Carotid artery disease ([place]) 08/30/2016   Carotid US 11/19: R 1-39, L 40-59 // Carotid US 08/2019: R 1-39; L 40-59 // Carotid US 11/21: R 1-39; L 40-59>> repeat 1 year    Chronic lower back pain    Coronary atherosclerosis of native coronary artery    a. BMS to [facility] 2004 and 2007, otherwise mild nonobstructive disease. EF normal.   Diverticulitis    Dyslipidemia    Dyspnea    Essential hypertension, benign    Family history of  breast cancer    Family history of lung cancer    Family history of prostate cancer    GERD (gastroesophageal reflux disease)    Lumbar radiculopathy, chronic 02/04/2015   Right L5   Obesity    OSA on CPAP    uses cpap   Paroxysmal atrial fibrillation  ([place])    a. Discovered after stroke.   Pneumonia 01/1996   PONV (postoperative nausea and vomiting)    Prostate CA ([place])    Recurrent upper respiratory infection (URI)    Stroke ([place]) 07/2012   no deficits   Visit for monitoring Tikosyn therapy 09/18/2019    Past Surgical History:  Procedure Laterality Date   ADENOIDECTOMY     ANTERIOR CERVICAL DECOMP/DISCECTOMY FUSION  07/2001; 10/2002   "C5-6; C6-7; redo"   BACK SURGERY     CARPAL TUNNEL RELEASE Left 10/2015   CHEST TUBE INSERTION Left 03/17/2021   Procedure: INSERTION PLEURAL DRAINAGE CATHETER;  Surgeon: Garner Nash, DO;  Location: [place];  Service: Pulmonary;  Laterality: Left;  Indwelling Tunneled Pleural catheter (PLEUREX)    COLONOSCOPY W/ POLYPECTOMY  02/2014   CORONARY ANGIOPLASTY WITH STENT PLACEMENT  05/2003; 12/2005   "mid RCA; mid RCA"   FINE NEEDLE ASPIRATION  02/26/2021   Procedure: FINE NEEDLE ASPIRATION (FNA) LINEAR;  Surgeon: Candee Furbish, MD;  Location: [facility] ENDOSCOPY;  Service: Pulmonary;;   HAND SURGERY  02/2019   LEFT HAND   implantable loop recorder placement  02/27/2020   [employer] Reveal [place] model [id number]  implantable loop recorder implanted by Dr Rayann Heman for afib management and evaluation of presyncope   JOINT REPLACEMENT     KNEE ARTHROSCOPY Left 10/2005   KNEE ARTHROSCOPY W/ PARTIAL MEDIAL MENISCECTOMY Left 09/2005   LUMBAR LAMINECTOMY/DECOMPRESSION MICRODISCECTOMY  03/2005   "L4-5"   POSTERIOR LUMBAR FUSION  10/2003   L5-S1; "plates, screws"   SHOULDER ARTHROSCOPY Right 08/2011   Debridement of labrum, arthroscopic distal clavicle excision   SHOULDER OPEN ROTATOR CUFF REPAIR Left 07/2014   TEE WITHOUT CARDIOVERSION  07/07/2012   Procedure: TRANSESOPHAGEAL ECHOCARDIOGRAM (TEE);  Surgeon: Fay Records, MD;  Location: [facility] ENDOSCOPY;  Service: Cardiovascular;  Laterality: N/A;   THORACENTESIS N/A 02/25/2021   Procedure: Mathews Robinsons;  Surgeon: Juanito Doom, MD;  Location: [place];  Service: Cardiopulmonary;  Laterality: N/A;   TONSILLECTOMY AND ADENOIDECTOMY  ~ 1956   TOTAL KNEE ARTHROPLASTY Left 10/2006   TRIGGER FINGER RELEASE Left 10/2015   VIDEO BRONCHOSCOPY WITH ENDOBRONCHIAL ULTRASOUND Left 02/26/2021   Procedure: VIDEO BRONCHOSCOPY WITH ENDOBRONCHIAL ULTRASOUND;  Surgeon: Candee Furbish, MD;  Location: [facility] ENDOSCOPY;  Service: Pulmonary;  Laterality: Left;  cryoprobe too thanks!     reports that he quit smoking about 17 years ago. His smoking use included cigarettes. He has a 34.00 pack-year smoking history. He has never used smokeless tobacco. He reports that he does not drink alcohol and does not use drugs.  Allergies  Allergen Reactions   Amoxicillin Swelling    Tolerates Rocephin   Fosinopril Other (See Comments)   Hydromorphone Nausea Only, Other (See Comments) and Nausea And Vomiting   Ampicillin Rash   Monopril [Fosinopril Sodium] Other (See Comments)    Muscle aches & pain   Other Other (See Comments)   Rosuvastatin Other (See Comments)    Joint pain    Family History  Problem Relation Age of Onset   Hypertension Mother    Heart disease Father    Heart attack Father  Prostate cancer Brother 63   Parkinson's disease Brother    Lung cancer Paternal Aunt        hx of smoking   Breast cancer Paternal Grandmother        dx 5s, bilateral mastectomies   Heart attack Paternal Grandfather    Blindness Son    Colon cancer Neg Hx    Pancreatic cancer Neg Hx       Prior to Admission medications   Medication Sig Start Date End Date Taking? Authorizing Provider  acetaminophen (TYLENOL 8 HOUR ARTHRITIS PAIN) 650 MG CR tablet Take 1,300 mg by mouth as needed for pain (pain).     [provider]  albuterol (VENTOLIN HFA) 108 (90 Base) MCG/ACT inhaler Inhale 2 puffs into the lungs every 4 hours as needed 01/08/21   Garnet Sierras, DO  atorvastatin (LIPITOR) 40 MG tablet Take 1 tablet (40 mg total) by mouth daily. 01/21/21   Nahser,  Wonda Cheng, MD  Carbinoxamine Maleate 6 MG TABS Take 1 tablet by mouth 2 (two) times daily as needed (drainage). 03/24/21   Garnet Sierras, DO  clindamycin (CLEOCIN) 300 MG capsule Take 300 mg by mouth See admin instructions. Uses for dental procedures    [provider]  diclofenac Sodium (VOLTAREN) 1 % GEL Apply 4 g topically 4 (four) times daily. 02/27/21   Mikhail, Velta Addison, DO  dicyclomine (BENTYL) 20 MG tablet Take 20 mg by mouth as needed for spasms.    [provider]  diltiazem (CARDIZEM) 30 MG tablet Take 1 tablet (30 mg total) by mouth 4 (four) times daily as needed. 01/21/21   Nahser, Wonda Cheng, MD  dofetilide (TIKOSYN) 500 MCG capsule Take 1 capsule (500 mcg total) by mouth 2 (two) times daily. Please keep upcoming appointment in February for future refills. Thank you 01/21/21   Nahser, Wonda Cheng, MD  fenofibrate 160 MG tablet TAKE 1 TABLET DAILY. PLEASE KEEP UPCOMING APPT IN MARCH WITH DR. Acie Fredrickson BEFORE ANYMORE REFILLS. 01/21/21   Nahser, Wonda Cheng, MD  fluticasone (FLONASE) 50 MCG/ACT nasal spray Place 2 sprays into both nostrils daily. 09/18/20   Garnet Sierras, DO  folic acid (FOLVITE) 1 MG tablet Take 1 tablet (1 mg total) by mouth daily. 03/18/21   Heilingoetter, Cassandra L, PA-C  ipratropium (ATROVENT) 0.03 % nasal spray Place 1-2 sprays into both nostrils every 12 (twelve) hours. For drainage 03/24/21   Garnet Sierras, DO  leuprolide, 6 Month, (ELIGARD) 45 MG injection Inject 45 mg into the skin every 6 (six) months.    [provider]  loperamide (IMODIUM) 2 MG capsule Take 2 mg by mouth as needed for diarrhea or loose stools.    [provider]  magnesium oxide (MAG-OX) 400 MG tablet TAKE 1 TABLET BY MOUTH EVERY DAY 09/10/20   Allred, Jeneen Rinks, MD  metoprolol succinate (TOPROL XL) 25 MG 24 hr tablet Take 1 tablet (25 mg total) by mouth daily. 08/04/20   Thompson Grayer, MD  Multiple Vitamins-Minerals (MULTIVITAMINS THER. W/MINERALS) TABS Take 1 tablet by mouth daily.     [provider]  nitroGLYCERIN (NITROSTAT) 0.4 MG SL tablet Place 1 tablet (0.4 mg total) under the tongue every 5 (five) minutes as needed for chest pain. 01/21/21   Nahser, Wonda Cheng, MD  omeprazole (PRILOSEC) 20 MG capsule Take 1 capsule (20 mg total) by mouth daily. 03/24/21   Garnet Sierras, DO  ondansetron (ZOFRAN) 8 MG tablet Take 1 tablet (8 mg total) by mouth  every 8 (eight) hours as needed for nausea or vomiting. Starting 3 days after chemotherapy 03/18/21   Heilingoetter, Cassandra L, PA-C  potassium chloride (KLOR-CON) 10 MEQ tablet Take 1 tablet (10 mEq total) by mouth daily. 10/23/20   Nahser, Wonda Cheng, MD  promethazine (PHENERGAN) 25 MG tablet Take 1 tablet (25 mg total) by mouth every 6 (six) hours as needed for nausea or vomiting. 03/25/21   Curt Bears, MD  Saccharomyces boulardii (PROBIOTIC) 250 MG CAPS Take 1 capsule by mouth daily.    [provider]  sucralfate (CARAFATE) 1 GM/10ML suspension Take 10 mLs (1 g total) by mouth 4 (four) times daily. Before meals 03/25/21   Curt Bears, MD  tamsulosin (FLOMAX) 0.4 MG CAPS capsule Take 0.4 mg by mouth daily.    [provider]  topiramate (TOPAMAX) 50 MG tablet Take 2 tablets (100 mg total) by mouth 2 (two) times daily. 01/16/20   Frann Rider, NP  traMADol (ULTRAM) 50 MG tablet Take 1 tablet (50 mg total) by mouth every 6 (six) hours as needed for moderate pain. 02/27/21   Cristal Ford, DO  traZODone (DESYREL) 50 MG tablet Take 0.5 tablets (25 mg total) by mouth at bedtime as needed for sleep. 09/22/20   Frann Rider, NP  Vitamin D, Ergocalciferol, (DRISDOL) 1.25 MG (50000 UNIT) CAPS capsule TAKE 1 CAPSULE BY MOUTH ONE TIME PER WEEK 03/19/21   Brunetta Genera, MD  XARELTO 20 MG TABS tablet TAKE 1 TABLET BY MOUTH EVERY DAY 10/06/20   Nahser, Wonda Cheng, MD    Physical Exam: Vitals:   04/01/21 0615 04/01/21 0645 04/01/21 0834 04/01/21 0915  BP: 104/81 103/70 111/71 118/69  Pulse: 74 83 76 83   Resp: 18 13 15 13   Temp:   98.3 F (36.8 C)   TempSrc:   Oral   SpO2: 100% 94% 97% 97%  Weight:      Height:         Vitals:   04/01/21 0615 04/01/21 0645 04/01/21 0834 04/01/21 0915  BP: 104/81 103/70 111/71 118/69  Pulse: 74 83 76 83  Resp: 18 13 15 13   Temp:   98.3 F (36.8 C)   TempSrc:   Oral   SpO2: 100% 94% 97% 97%  Weight:      Height:          Constitutional: Alert and oriented x 3 . Not in any apparent distress HEENT:      Head: Normocephalic and atraumatic.         Eyes: PERLA, EOMI, Conjunctivae are normal. Sclera is non-icteric.       Mouth/Throat: Mucous membranes are moist.       Neck: Supple with no signs of meningismus. Cardiovascular: Regular rate and rhythm. No murmurs, gallops, or rubs. 2+ symmetrical distal pulses are present . No JVD. No LE edema Respiratory: Respiratory effort normal . Coarse breath sounds in all lung fields.  Gastrointestinal: Soft, non tender, and non distended with positive bowel sounds. Central adiposity Genitourinary: No CVA tenderness. Musculoskeletal: Nontender with normal range of motion in all extremities. No cyanosis, or erythema of extremities. Neurologic:  Face is symmetric. Moving all extremities. No gross focal neurologic deficits . Skin: Skin is warm, dry.  No rash or ulcers Psychiatric: Mood and affect are normal    Labs on Admission: I have personally reviewed following labs and imaging studies  CBC: Recent Labs  Lab 03/31/21 1020 04/01/21 0542  WBC 4.8 4.3  NEUTROABS 3.6  --  HGB 10.5* 9.6*  HCT 32.0* 30.9*  MCV 90.9 93.1  PLT 311 016   Basic Metabolic Panel: Recent Labs  Lab 03/31/21 1020 04/01/21 0542  NA 135 135  K 3.6 4.1  CL 105 105  CO2 20* 21*  GLUCOSE 120* 126*  BUN 19 17  CREATININE 0.85 0.61  CALCIUM 9.2 9.3   GFR: Estimated Creatinine Clearance: 110.2 mL/min (by C-G formula based on SCr of 0.61 mg/dL). Liver Function Tests: Recent Labs  Lab 03/31/21 1020  AST 14*  ALT 12   ALKPHOS 43  BILITOT 0.4  PROT 6.4*  ALBUMIN 2.9*   No results for input(s): LIPASE, AMYLASE in the last 168 hours. No results for input(s): AMMONIA in the last 168 hours. Coagulation Profile: No results for input(s): INR, PROTIME in the last 168 hours. Cardiac Enzymes: No results for input(s): CKTOTAL, CKMB, CKMBINDEX, TROPONINI in the last 168 hours. BNP (last 3 results) No results for input(s): PROBNP in the last 8760 hours. HbA1C: No results for input(s): HGBA1C in the last 72 hours. CBG: No results for input(s): GLUCAP in the last 168 hours. Lipid Profile: No results for input(s): CHOL, HDL, LDLCALC, TRIG, CHOLHDL, LDLDIRECT in the last 72 hours. Thyroid Function Tests: Recent Labs    03/31/21 1019  TSH 1.252   Anemia Panel: No results for input(s): VITAMINB12, FOLATE, FERRITIN, TIBC, IRON, RETICCTPCT in the last 72 hours. Urine analysis:    Component Value Date/Time   COLORURINE AMBER (A) 06/04/2016 1304   APPEARANCEUR CLEAR 06/04/2016 1304   LABSPEC 1.019 06/04/2016 1304   PHURINE 5.5 06/04/2016 1304   GLUCOSEU NEGATIVE 06/04/2016 1304   HGBUR NEGATIVE 06/04/2016 1304   BILIRUBINUR NEGATIVE 06/04/2016 1304   KETONESUR NEGATIVE 06/04/2016 1304   PROTEINUR NEGATIVE 06/04/2016 1304   NITRITE NEGATIVE 06/04/2016 1304   LEUKOCYTESUR SMALL (A) 06/04/2016 1304    Radiological Exams on Admission: CT Angio Chest Pulmonary Embolism (PE) W or WO Contrast  Result Date: 04/01/2021 CLINICAL DATA:  75 year old male with hemoptysis, recent diagnosis of lung cancer. Pulmonary embolism suspected. EXAM: CT ANGIOGRAPHY CHEST WITH CONTRAST TECHNIQUE: Multidetector CT imaging of the chest was performed using the standard protocol during bolus administration of intravenous contrast. Multiplanar CT image reconstructions and MIPs were obtained to evaluate the vascular anatomy. CONTRAST:  100 mL Omnipaque 350, intravenous COMPARISON:  03/21/2021, 03/24/2021 FINDINGS: Cardiovascular:  Satisfactory opacification of the pulmonary arteries to the segmental level. No evidence of pulmonary embolism. Normal heart size. Severe coronary atherosclerotic calcifications. Scattered thoracic aortic atherosclerotic calcifications. No pericardial effusion. Mediastinum/Nodes: Multiple prominent aortopulmonic window lymph nodes, the largest measuring up to 14 mm in short axis, similar to comparison. The thyroid gland is within normal limits. The esophagus is mildly patulous throughout. Trachea is within normal limits. No axillary or supraclavicular lymphadenopathy. Lungs/Pleura: Similar appearing central left lower lobe opacifications with associated architectural distortion, difficult to measure giving extent from the left lung base to the hilum, overall similar to recent comparisons. Similar appearing left basilar pleural thickening and multifocal subpleural left upper and lower lobe nodular opacities, the largest measuring up to 12 mm in the lingula. Interval development of element of diffuse ground-glass and reticular opacifications in the left lower lobe. No pneumothorax. The right lung is clear. Upper Abdomen: Similar appearing hypodense, poorly defined mass in the caudate lobe of the liver measuring up to approximately 3.6 cm. The remaining visualized upper abdomen is within normal limits. Musculoskeletal: Partially visualized anterior cervical fusion hardware in place without complicating features. Multilevel degenerative  changes of the thoracic spine. No acute osseous abnormality or aggressive appearing osseous lesion. Review of the MIP images confirms the above findings. IMPRESSION: Vascular: 1. No pulmonary embolism. 2. Coronary and aortic atherosclerosis (ICD10-I70.0). Non-Vascular: 1. Interval development of ground-glass and reticular opacities in the left lower lobe as could be seen with progressive lymphangitic carcinomatosis secondary to known advanced stage left lower lobe lung mass. Superimposed  pneumonia could appear similarly. 2. Overall similar appearance of poorly defined, extensive central left lower lobe mass and associated left basilar pleural thickening and scattered left upper and lower lobe pleural nodularity. 3. Similar appearing hypoattenuating caudate lobe mass and left mediastinal lymphadenopathy compatible with known metastatic disease. Ruthann Cancer, MD [facility] Radiology Electronically Signed   By: Ruthann Cancer MD   On: 04/01/2021 07:30     Assessment/Plan Principal Problem:   Cough with hemoptysis Active Problems:   Benign essential HTN   AF (paroxysmal atrial fibrillation) (HCC)   Non-small cell carcinoma of left lung, stage 4 (HCC)   CAP (community acquired pneumonia)      Cough with hemoptysis In a patient with known stage IV non-small cell cancer and on chronic anticoagulation therapy with Xarelto for paroxysmal atrial fibrillation. Hemoptysis may be secondary to anticoagulation therapy, will hold Xarelto for now We will also treat patient empirically for presumed community-acquired pneumonia with Rocephin and doxycycline.     Paroxysmal atrial fibrillation Continue metoprolol and dofetilide for rate control Hold Xarelto due to hemoptysis     Stage IV non-small cell carcinoma of left lung Patient has completed radiation therapy and received his first chemotherapy on March 31, 2021 Will request oncology consult     GERD Continue Carafate and Protonix     Prolonged QT  Place patient on cardiac monitor Check electrolytes Repeat EKG in a.m. and if unchanged will hold dofetilide and consult cardiology   DVT prophylaxis: SCD  Code Status: DNR  Family Communication: Greater than 50% of time was spent discussing patient's condition and plan of care with him at the bedside.  All questions and concerns have been addressed.  CODE STATUS was discussed and he is a DO NOT RESUSCITATE. Disposition  Plan: Back to previous home environment Consults called: Pulmonary/oncology Status: Observation    Dasean Brow MD [facility]     04/01/2021, 10:20 AM

## 2021-04-01 NOTE — Progress Notes (Signed)
Unable to given nebulized mdeications at this time due to pt having pending covid test. MD Noemi Chapel messaged in regards to whether or not she wants covid test changed to a rapid test. With 6-24 hr test nebulization will have to wait until test is resulted.

## 2021-04-01 NOTE — ED Notes (Signed)
Spoke with pharmacy regarding pt's unverified medications for 1000AM this morning. Pharmacy "working on it now."

## 2021-04-01 NOTE — ED Notes (Signed)
Pt attached to cardiac monitor x3. A&O x4. Sitting upright in ED stretcher using cell phone. Pt given urinal as requested. VSS at this time.

## 2021-04-01 NOTE — ED Notes (Addendum)
Pt has Tikasyn ordered at 1400pm today. This nurse recaptured EKG, which has been exported and verified with Hospitalist which shows pt's QTc is 496 at this time. This nurse verified this with Hospitalist Tochukwu Agbata, MD., who verifies the medication is to still be given. Pt additionally verifies he receives this medication twice daily. Confirmed with pharmacy. Medication given per provider's verbal and written orders.

## 2021-04-01 NOTE — ED Notes (Signed)
Hospitalist at the bedside 

## 2021-04-01 NOTE — ED Provider Notes (Signed)
  Physical Exam  BP 103/70   Pulse 83   Temp 98.1 F (36.7 C) (Oral)   Resp 13   Ht 6\' 2"  (1.88 m)   Wt 113.4 kg   SpO2 94%   BMI 32.10 kg/m   Physical Exam  ED Course/Procedures     Procedures  MDM   Received care of patient from Dr. Maurie Boettcher.  Please see his note for prior history, physical and care.  Briefly this is a 75 year old male with a history non-small cell carcinoma of the lung with malignant pleural effusion and Pleurx catheter placed June 14, atrial fibrillation on Xarelto, hypertension, hyperlipidemia, who presents with episode of hemoptysis.  Reported episode of coughing up bright red blood, approximately 20 cc, followed by darker colored blood.  At this time he is coughing up mucus with a pink tinge.  He has been taking his Xarelto as prescribed.  Denies any chest pain or shortness of breath.  CT PE study shows no pulmonary embolus, development of groundglass and reticular opacities in the left lower lobe is concerning with progressive lymphangitic carcinomatosis secondary to known advanced age left lower lobe lung mass.  Similar appearance of the extensive central left lower lobe mass with left basilar pleural thickening.   Discussed with Dr. Carlis Abbott of pulmonology. Will hold xarelto, admitted to hospitalist for continued observation and consult in setting of hemoptysis on anticoagulation. Will cover for possible pneumonia with rocephin/azithromycin.      Gareth Morgan, MD 04/01/21 [phone]

## 2021-04-01 NOTE — ED Provider Notes (Signed)
[facility] Emergency Department Provider Note MRN:  [id number]  Arrival date & time: 04/01/21     Chief Complaint   Hemoptysis History of Present Illness   Corey [name] is a 75 y.o. year-old male with a history of lung cancer presenting to the ED with chief complaint of hemoptysis.  Shortly prior to arrival patient began coughing up bright red blood.  Denies any pain, no shortness of breath.  Multiple episodes, unsure if coughed up a blood clot or tissue.  Just started chemotherapy yesterday for lung cancer.  Denies any recent fever or illness, no shortness of breath, no other complaints.  Review of Systems  A complete 10 system review of systems was obtained and all systems are negative except as noted in the HPI and PMH.   Patient's Health History    Past Medical History:  Diagnosis Date   Anemia    Anxiety    Arthritis    "back, right knee, hands, ankles, neck" (05/31/2016)   Carotid artery disease ([place]) 08/30/2016   Carotid US 11/19: R 1-39, L 40-59 // Carotid US 08/2019: R 1-39; L 40-59 // Carotid US 11/21: R 1-39; L 40-59>> repeat 1 year    Chronic lower back pain    Coronary atherosclerosis of native coronary artery    a. BMS to [facility] 2004 and 2007, otherwise mild nonobstructive disease. EF normal.   Diverticulitis    Dyslipidemia    Dyspnea    Essential hypertension, benign    Family history of breast cancer    Family history of lung cancer    Family history of prostate cancer    GERD (gastroesophageal reflux disease)    Lumbar radiculopathy, chronic 02/04/2015   Right L5   Obesity    OSA on CPAP    uses cpap   Paroxysmal atrial fibrillation ([place])    a. Discovered after stroke.   Pneumonia 01/1996   PONV (postoperative nausea and vomiting)    Prostate CA ([place])    Recurrent upper respiratory infection (URI)    Stroke ([place]) 07/2012   no deficits   Visit for monitoring Tikosyn therapy 09/18/2019    Past Surgical History:  Procedure  Laterality Date   ADENOIDECTOMY     ANTERIOR CERVICAL DECOMP/DISCECTOMY FUSION  07/2001; 10/2002   "C5-6; C6-7; redo"   BACK SURGERY     CARPAL TUNNEL RELEASE Left 10/2015   CHEST TUBE INSERTION Left 03/17/2021   Procedure: INSERTION PLEURAL DRAINAGE CATHETER;  Surgeon: Garner Nash, DO;  Location: [place];  Service: Pulmonary;  Laterality: Left;  Indwelling Tunneled Pleural catheter (PLEUREX)    COLONOSCOPY W/ POLYPECTOMY  02/2014   CORONARY ANGIOPLASTY WITH STENT PLACEMENT  05/2003; 12/2005   "mid RCA; mid RCA"   FINE NEEDLE ASPIRATION  02/26/2021   Procedure: FINE NEEDLE ASPIRATION (FNA) LINEAR;  Surgeon: Candee Furbish, MD;  Location: [facility] ENDOSCOPY;  Service: Pulmonary;;   HAND SURGERY  02/2019   LEFT HAND   implantable loop recorder placement  02/27/2020   Medtronic Reveal [place] model [id number]  implantable loop recorder implanted by Dr Rayann Heman for afib management and evaluation of presyncope   JOINT REPLACEMENT     KNEE ARTHROSCOPY Left 10/2005   KNEE ARTHROSCOPY W/ PARTIAL MEDIAL MENISCECTOMY Left 09/2005   LUMBAR LAMINECTOMY/DECOMPRESSION MICRODISCECTOMY  03/2005   "L4-5"   POSTERIOR LUMBAR FUSION  10/2003   L5-S1; "plates, screws"   SHOULDER ARTHROSCOPY Right 08/2011   Debridement of labrum, arthroscopic distal clavicle excision  SHOULDER OPEN ROTATOR CUFF REPAIR Left 07/2014   TEE WITHOUT CARDIOVERSION  07/07/2012   Procedure: TRANSESOPHAGEAL ECHOCARDIOGRAM (TEE);  Surgeon: Fay Records, MD;  Location[facility]COPY;  Service: Cardiovascular;  Laterality: N/A;   THORACENTESIS N/A 02/25/2021   Procedure: Mathews Robinsons;  Surgeon: Juanito Doom, MD;  Location: [facility];  Service: Cardiopulmonary;  Laterality: N/A;   TONSILLECTOMY AND ADENOIDECTOMY  ~ 1956   TOTAL KNEE ARTHROPLASTY Left 10/2006   TRIGGER FINGER RELEASE Left 10/2015   VIDEO BRONCHOSCOPY WITH ENDOBRONCHIAL ULTRASOUND Left 02/26/2021   Procedure: VIDEO BRONCHOSCOPY WITH ENDOBRONCHIAL ULTRASOUND;   Surgeon: Candee Furbish, MD;  Location: [facility] ENDOSCOPY;  Service: Pulmonary;  Laterality: Left;  cryoprobe too thanks!    Family History  Problem Relation Age of Onset   Hypertension Mother    Heart disease Father    Heart attack Father    Prostate cancer Brother 42   Parkinson's disease Brother    Lung cancer Paternal Aunt        hx of smoking   Breast cancer Paternal Grandmother        dx 48s, bilateral mastectomies   Heart attack Paternal Grandfather    Blindness Son    Colon cancer Neg Hx    Pancreatic cancer Neg Hx     Social History   Socioeconomic History   Marital status: Married    Spouse name: Renee   Number of children: 3   Years of education: 14   Highest education level: Not on file  Occupational History   Occupation: Retired    Fish farm manager: DISABLED     Comment: Works at [employer] part time  Tobacco Use   Smoking status: Former    Packs/day: 1.00    Years: 34.00    Pack years: 34.00    Types: Cigarettes    Quit date: 05/05/2003    Years since quitting: 17.9   Smokeless tobacco: Never  Vaping Use   Vaping Use: Never used  Substance and Sexual Activity   Alcohol use: No    Alcohol/week: 0.0 standard drinks   Drug use: No   Sexual activity: Not Currently  Other Topics Concern   Not on file  Social History Narrative   Patient lives at home with spouse.   Caffeine Use:    Social Determinants of Health   Financial Resource Strain: Not on file  Food Insecurity: Not on file  Transportation Needs: Not on file  Physical Activity: Not on file  Stress: Not on file  Social Connections: Not on file  Intimate Partner Violence: Not on file     Physical Exam   Vitals:   04/01/21 0534 04/01/21 0615  BP: (!) 148/82 104/81  Pulse: 81 74  Resp: 18 18  Temp: 98.1 F (36.7 C)   SpO2: 96% 100%    CONSTITUTIONAL: Well-appearing, NAD NEURO:  Alert and oriented x 3, no focal deficits EYES:  eyes equal and reactive ENT/NECK:  no LAD, no JVD CARDIO: Regular  rate, well-perfused, normal S1 and S2 PULM:  CTAB no wheezing or rhonchi GI/GU:  normal bowel sounds, non-distended, non-tender MSK/SPINE:  No gross deformities, no edema SKIN:  no rash, atraumatic PSYCH:  Appropriate speech and behavior  *Additional and/or pertinent findings included in MDM below  Diagnostic and Interventional Summary    EKG Interpretation  Date/Time:  Wednesday April 01 2021 05:41:34 EDT Ventricular Rate:  77 PR Interval:  196 QRS Duration: 105 QT Interval:  484 QTC Calculation: 548 R Axis:  56 Text Interpretation: Sinus rhythm Prolonged QT interval Confirmed by Gerlene Fee [phone]) on 04/01/2021 6:48:17 AM        Labs Reviewed  CBC  BASIC METABOLIC PANEL    CT Angio Chest Pulmonary Embolism (PE) W or WO Contrast    (Results Pending)    Medications  iohexol (OMNIPAQUE) 350 MG/ML injection 100 mL (has no administration in time range)  sodium chloride (PF) 0.9 % injection (has no administration in time range)     Procedures  /  Critical Care Procedures  ED Course and Medical Decision Making  I have reviewed the triage vital signs, the nursing notes, and pertinent available records from the EMR.  Listed above are laboratory and imaging tests that I personally ordered, reviewed, and interpreted and then considered in my medical decision making (see below for details).  Differential diagnosis includes PE, bleeding from known lung cancer, complication of chest tube, benign bleeding related to cough, CT is pending.  Signed out to oncoming provider at shift change, anticipating discharge if reassuring CT.       Barth Kirks. Sedonia Small, [place] [email or web address]  Final Clinical Impressions(s) / ED Diagnoses     ICD-10-CM   1. Hemoptysis  R04.2       ED Discharge Orders     None        Discharge Instructions Discussed with and Provided to Patient:   Discharge Instructions   None        Maudie Flakes, MD 04/01/21 [phone]

## 2021-04-01 NOTE — Consult Note (Signed)
NAME:  Corey [name], MRN:  [id number], DOB:  April 06, 1946, LOS: 0 ADMISSION DATE:  04/01/2021, CONSULTATION DATE:  04/01/21 REFERRING MD:  Unknown Foley, CHIEF COMPLAINT:  hemoptysis  History of Present Illness:  Mr. Flemmer is a 75 y/o gentleman with a history of stage IV NSCLC with MPE s/p pleurX tube placement who presented overnight due to new onset hemoptysis. He received his first chemotherapy yesterday and has not had side effects. He was up last night to go to the bathroom and began coughing when he got back in bed. He coughed about 10-15 minutes then suddenly coughed something up that had a salty taste. When he turned on the lights he had coughed up bright red blood. He coughed a few more times a few minutes later, also coughing up bright red blood. At one point he coughed up clotted blood vs a small piece of tissue. He has coughed up blood admixed with watery sputum since arriving at the ED, which is less bloody than what he had been producing at home. He takes Xarelto for Afib; last dose was last evening. He has not had epistaxis or bleeding otherwise. He has noticed more wheezing and chest rattling recently, but no fevers or malaise.   Pertinent  Medical History  Stage 4 NSCLC COPD Afib on xarelto  Significant Hospital Events: Including procedures, antibiotic start and stop dates in addition to other pertinent events     Interim History / Subjective:    Objective   Blood pressure 106/67, pulse 77, temperature 98.3 F (36.8 C), temperature source Oral, resp. rate 15, height 6\' 2"  (1.88 m), weight 113.4 kg, SpO2 94 %.       No intake or output data in the 24 hours ending 04/01/21 1123 Filed Weights   04/01/21 0534  Weight: 113.4 kg    Examination: General: elderly man lying in bed talking on the phone, in NAD HENT: [place]/AT, eyes anicteric Lungs: diffuse wheezing bilaterally, rhales, no rhonchi. L pleurx in place- bandage intact. Cardiovascular: S1S2, irreg rhythm Abdomen:  obese, soft, NT Extremities: minimal BLE edema, no cyanosis Neuro: awake, alert, moving all extremities, answering questions appropriately, normal speech Derm: warm, dry, no bruising or rashes  Resolved Hospital Problem list     Assessment & Plan:  Hemoptysis-- etiology unclear. Tumor is the major risk factor, but CT with airway thickening raises concern for bronchitis, especially with wheezing on exam. -Hold Xarelto, needs to be admitted for monitoring. -empiric CAP treatment; azithromycin and ceftriaxone started by ED -sputum culture -monitor blood output> if increasing amount, he will require bronchoscopy for localization to determine if embolization if feasible -hold Xarelto -TXA nebs  Acute COPD exacerbation -prednisone 40mg  x 5 days -duonebs -recommend discharge on LAMA/LABA combination inhaler (Stiolto, Anoro, or [place])  We discussed his code status. I agree with DNR. If his hemoptysis progresses and he requires procedures to evaluate and manage this, he is likely to require intubation, which he would be fine with. He would not want a prolonged time on MV, which he hopefully would not require for this issue.  Best Practice (right click and "Reselect all SmartList Selections" daily)   Per primary  Labs   CBC: Recent Labs  Lab 03/31/21 1020 04/01/21 0542  WBC 4.8 4.3  NEUTROABS 3.6  --   HGB 10.5* 9.6*  HCT 32.0* 30.9*  MCV 90.9 93.1  PLT 311 347    Basic Metabolic Panel: Recent Labs  Lab 03/31/21 1020 04/01/21 0542  NA 135 135  K 3.6 4.1  CL 105 105  CO2 20* 21*  GLUCOSE 120* 126*  BUN 19 17  CREATININE 0.85 0.61  CALCIUM 9.2 9.3   GFR: Estimated Creatinine Clearance: 110.2 mL/min (by C-G formula based on SCr of 0.61 mg/dL). Recent Labs  Lab 03/31/21 1020 04/01/21 0542 04/01/21 0847  WBC 4.8 4.3  --   LATICACIDVEN  --   --  1.1    Liver Function Tests: Recent Labs  Lab 03/31/21 1020  AST 14*  ALT 12  ALKPHOS 43  BILITOT 0.4  PROT 6.4*   ALBUMIN 2.9*   No results for input(s): LIPASE, AMYLASE in the last 168 hours. No results for input(s): AMMONIA in the last 168 hours.  ABG    Component Value Date/Time   TCO2 26 10/31/2012 1253     Coagulation Profile: No results for input(s): INR, PROTIME in the last 168 hours.  Cardiac Enzymes: No results for input(s): CKTOTAL, CKMB, CKMBINDEX, TROPONINI in the last 168 hours.  HbA1C: Hgb A1c MFr Bld  Date/Time Value Ref Range Status  07/07/2012 05:30 AM 5.9 (H) <5.7 % Final    Comment:    (NOTE)                                                                       According to the ADA Clinical Practice Recommendations for 2011, when HbA1c is used as a screening test:  >=6.5%   Diagnostic of Diabetes Mellitus           (if abnormal result is confirmed) 5.7-6.4%   Increased risk of developing Diabetes Mellitus References:Diagnosis and Classification of Diabetes Mellitus,Diabetes Care,2011,34(Suppl 1):S62-S69 and Standards of Medical Care in         Diabetes - 2011,Diabetes PYKD,9833,82 (Suppl 1):S11-S61.  07/06/2012 12:24 AM 6.0 (H) <5.7 % Final    Comment:    (NOTE)                                                                       According to the ADA Clinical Practice Recommendations for 2011, when HbA1c is used as a screening test:  >=6.5%   Diagnostic of Diabetes Mellitus           (if abnormal result is confirmed) 5.7-6.4%   Increased risk of developing Diabetes Mellitus References:Diagnosis and Classification of Diabetes Mellitus,Diabetes NKNL,9767,34(LPFXT 1):S62-S69 and Standards of Medical Care in         Diabetes - 2011,Diabetes KWIO,9735,32 (Suppl 1):S11-S61.    CBG: No results for input(s): GLUCAP in the last 168 hours.  Review of Systems:   Review of Systems  Constitutional:  Negative for chills and fever.  HENT:  Negative for nosebleeds.        Allergy congestion, no bleeding gums  Eyes: Negative.   Respiratory:  Positive for cough, sputum  production and wheezing.   Cardiovascular:  Negative for chest pain and leg swelling.  Gastrointestinal:  Positive for heartburn. Negative for blood in stool, nausea and vomiting.  Genitourinary: Negative.   Musculoskeletal:  Negative for myalgias.  Skin:  Negative for rash.  Neurological:  Negative for weakness.  Endo/Heme/Allergies:  Positive for environmental allergies.  Psychiatric/Behavioral: Negative.      Past Medical History:  He,  has a past medical history of Anemia, Anxiety, Arthritis, Carotid artery disease ([place]) (08/30/2016), Chronic lower back pain, Coronary atherosclerosis of native coronary artery, Diverticulitis, Dyslipidemia, Dyspnea, Essential hypertension, benign, Family history of breast cancer, Family history of lung cancer, Family history of prostate cancer, GERD (gastroesophageal reflux disease), Lumbar radiculopathy, chronic (02/04/2015), Obesity, OSA on CPAP, Paroxysmal atrial fibrillation ([place]), Pneumonia (01/1996), PONV (postoperative nausea and vomiting), Prostate CA ([place]), Recurrent upper respiratory infection (URI), Stroke ([place]) (07/2012), and Visit for monitoring Tikosyn therapy (09/18/2019).   Surgical History:   Past Surgical History:  Procedure Laterality Date   ADENOIDECTOMY     ANTERIOR CERVICAL DECOMP/DISCECTOMY FUSION  07/2001; 10/2002   "C5-6; C6-7; redo"   BACK SURGERY     CARPAL TUNNEL RELEASE Left 10/2015   CHEST TUBE INSERTION Left 03/17/2021   Procedure: INSERTION PLEURAL DRAINAGE CATHETER;  Surgeon: Garner Nash, DO;  Location: [place];  Service: Pulmonary;  Laterality: Left;  Indwelling Tunneled Pleural catheter (PLEUREX)    COLONOSCOPY W/ POLYPECTOMY  02/2014   CORONARY ANGIOPLASTY WITH STENT PLACEMENT  05/2003; 12/2005   "mid RCA; mid RCA"   FINE NEEDLE ASPIRATION  02/26/2021   Procedure: FINE NEEDLE ASPIRATION (FNA) LINEAR;  Surgeon: Candee Furbish, MD;  Location: [facility] ENDOSCOPY;  Service: Pulmonary;;   HAND SURGERY  02/2019   LEFT  HAND   implantable loop recorder placement  02/27/2020   Medtronic Reveal [place] model [id number]  implantable loop recorder implanted by Dr Rayann Heman for afib management and evaluation of presyncope   JOINT REPLACEMENT     KNEE ARTHROSCOPY Left 10/2005   KNEE ARTHROSCOPY W/ PARTIAL MEDIAL MENISCECTOMY Left 09/2005   LUMBAR LAMINECTOMY/DECOMPRESSION MICRODISCECTOMY  03/2005   "L4-5"   POSTERIOR LUMBAR FUSION  10/2003   L5-S1; "plates, screws"   SHOULDER ARTHROSCOPY Right 08/2011   Debridement of labrum, arthroscopic distal clavicle excision   SHOULDER OPEN ROTATOR CUFF REPAIR Left 07/2014   TEE WITHOUT CARDIOVERSION  07/07/2012   Procedure: TRANSESOPHAGEAL ECHOCARDIOGRAM (TEE);  Surgeon: Fay Records, MD;  Location: [facility] ENDOSCOPY;  Service: Cardiovascular;  Laterality: N/A;   THORACENTESIS N/A 02/25/2021   Procedure: Mathews Robinsons;  Surgeon: Juanito Doom, MD;  Location: [place];  Service: Cardiopulmonary;  Laterality: N/A;   TONSILLECTOMY AND ADENOIDECTOMY  ~ 1956   TOTAL KNEE ARTHROPLASTY Left 10/2006   TRIGGER FINGER RELEASE Left 10/2015   VIDEO BRONCHOSCOPY WITH ENDOBRONCHIAL ULTRASOUND Left 02/26/2021   Procedure: VIDEO BRONCHOSCOPY WITH ENDOBRONCHIAL ULTRASOUND;  Surgeon: Candee Furbish, MD;  Location: [facility] ENDOSCOPY;  Service: Pulmonary;  Laterality: Left;  cryoprobe too thanks!     Social History:   reports that he quit smoking about 17 years ago. His smoking use included cigarettes. He has a 34.00 pack-year smoking history. He has never used smokeless tobacco. He reports that he does not drink alcohol and does not use drugs.   Family History:  His family history includes Blindness in his son; Breast cancer in his paternal grandmother; Heart attack in his father and paternal grandfather; Heart disease in his father; Hypertension in his mother; Lung cancer in his paternal aunt; Parkinson's disease in his brother; Prostate cancer (age of onset: 66) in his brother. There is no  history of Colon cancer or Pancreatic cancer.  Allergies Allergies  Allergen Reactions   Amoxicillin Swelling    Tolerates Rocephin   Fosinopril Other (See Comments)   Hydromorphone Nausea Only, Other (See Comments) and Nausea And Vomiting   Ampicillin Rash   Monopril [Fosinopril Sodium] Other (See Comments)    Muscle aches & pain   Other Other (See Comments)   Rosuvastatin Other (See Comments)    Joint pain     Home Medications  Prior to Admission medications   Medication Sig Start Date End Date Taking? Authorizing Provider  acetaminophen (TYLENOL 8 HOUR ARTHRITIS PAIN) 650 MG CR tablet Take 1,300 mg by mouth every 8 (eight) hours as needed for pain (pain).   Yes [provider]  albuterol (VENTOLIN HFA) 108 (90 Base) MCG/ACT inhaler Inhale 2 puffs into the lungs every 4 hours as needed 01/08/21  Yes Garnet Sierras, DO  ALPRAZolam Duanne Moron) 0.5 MG tablet Take 0.5 mg by mouth at bedtime as needed for anxiety.   Yes [provider]  atorvastatin (LIPITOR) 40 MG tablet Take 1 tablet (40 mg total) by mouth daily. 01/21/21  Yes Nahser, Wonda Cheng, MD  Carbinoxamine Maleate 6 MG TABS Take 1 tablet by mouth 2 (two) times daily as needed (drainage). 03/24/21  Yes Garnet Sierras, DO  clindamycin (CLEOCIN) 300 MG capsule Take 300 mg by mouth See admin instructions. Uses for dental procedures   Yes [provider]  diclofenac Sodium (VOLTAREN) 1 % GEL Apply 4 g topically 4 (four) times daily. Patient taking differently: Apply 4 g topically 4 (four) times daily as needed (joint pain). 02/27/21  Yes Mikhail, Velta Addison, DO  dicyclomine (BENTYL) 20 MG tablet Take 20 mg by mouth as needed for spasms.   Yes [provider]  diltiazem (CARDIZEM) 30 MG tablet Take 1 tablet (30 mg total) by mouth 4 (four) times daily as needed. Patient taking differently: Take 30 mg by mouth 4 (four) times daily as needed (AFIB). 01/21/21  Yes Nahser, Wonda Cheng, MD  dofetilide (TIKOSYN) 500 MCG capsule  Take 1 capsule (500 mcg total) by mouth 2 (two) times daily. Please keep upcoming appointment in February for future refills. Thank you 01/21/21  Yes Nahser, Wonda Cheng, MD  fenofibrate 160 MG tablet TAKE 1 TABLET DAILY. PLEASE KEEP UPCOMING APPT IN MARCH WITH DR. Acie Fredrickson BEFORE ANYMORE REFILLS. 01/21/21  Yes Nahser, Wonda Cheng, MD  fluticasone (FLONASE) 50 MCG/ACT nasal spray Place 2 sprays into both nostrils daily. 09/18/20  Yes Garnet Sierras, DO  folic acid (FOLVITE) 1 MG tablet Take 1 tablet (1 mg total) by mouth daily. 03/18/21  Yes Heilingoetter, Cassandra L, PA-C  ipratropium (ATROVENT) 0.03 % nasal spray Place 1-2 sprays into both nostrils every 12 (twelve) hours. For drainage 03/24/21  Yes Garnet Sierras, DO  leuprolide, 6 Month, (ELIGARD) 45 MG injection Inject 45 mg into the skin every 6 (six) months.   Yes [provider]  loperamide (IMODIUM) 2 MG capsule Take 2 mg by mouth daily as needed for diarrhea or loose stools.   Yes [provider]  magnesium oxide (MAG-OX) 400 MG tablet TAKE 1 TABLET BY MOUTH EVERY DAY 09/10/20  Yes Allred, Jeneen Rinks, MD  metoprolol succinate (TOPROL XL) 25 MG 24 hr tablet Take 1 tablet (25 mg total) by mouth daily. 08/04/20  Yes AllredJeneen Rinks, MD  Multiple Vitamins-Minerals (MULTIVITAMINS THER. W/MINERALS) TABS Take 1 tablet by mouth daily.   Yes [provider]  nitroGLYCERIN (NITROSTAT) 0.4 MG SL tablet Place 1 tablet (0.4  mg total) under the tongue every 5 (five) minutes as needed for chest pain. 01/21/21  Yes Nahser, Wonda Cheng, MD  omeprazole (PRILOSEC) 20 MG capsule Take 1 capsule (20 mg total) by mouth daily. 03/24/21  Yes Garnet Sierras, DO  ondansetron (ZOFRAN) 8 MG tablet Take 1 tablet (8 mg total) by mouth every 8 (eight) hours as needed for nausea or vomiting. Starting 3 days after chemotherapy 03/18/21  Yes Heilingoetter, Cassandra L, PA-C  potassium chloride (KLOR-CON) 10 MEQ tablet Take 1 tablet (10 mEq total) by mouth daily. 10/23/20  Yes Nahser,  Wonda Cheng, MD  promethazine (PHENERGAN) 25 MG tablet Take 1 tablet (25 mg total) by mouth every 6 (six) hours as needed for nausea or vomiting. 03/25/21  Yes Curt Bears, MD  Saccharomyces boulardii (PROBIOTIC) 250 MG CAPS Take 1 capsule by mouth daily.   Yes [provider]  sucralfate (CARAFATE) 1 GM/10ML suspension Take 10 mLs (1 g total) by mouth 4 (four) times daily. Before meals 03/25/21  Yes Curt Bears, MD  tamsulosin (FLOMAX) 0.4 MG CAPS capsule Take 0.4 mg by mouth daily.   Yes [provider]  topiramate (TOPAMAX) 50 MG tablet Take 2 tablets (100 mg total) by mouth 2 (two) times daily. Patient taking differently: Take 50-100 mg by mouth See admin instructions. Taking 50 mg in the AM and taking 2 tabs ( 100mg ) in the evening 01/16/20  Yes McCue, Janett Billow, NP  traMADol (ULTRAM) 50 MG tablet Take 1 tablet (50 mg total) by mouth every 6 (six) hours as needed for moderate pain. 02/27/21  Yes Mikhail, Velta [place]ison, DO  traZODone (DESYREL) 50 MG tablet Take 0.5 tablets (25 mg total) by mouth at bedtime as needed for sleep. 09/22/20  Yes McCue, Janett Billow, NP  Vitamin D, Ergocalciferol, (DRISDOL) 1.25 MG (50000 UNIT) CAPS capsule TAKE 1 CAPSULE BY MOUTH ONE TIME PER WEEK Patient taking differently: Take 50,000 Units by mouth every 7 (seven) days. Monday 03/19/21  Yes Kale, Cloria Spring, MD  XARELTO 20 MG TABS tablet TAKE 1 TABLET BY MOUTH EVERY DAY Patient taking differently: Take 20 mg by mouth daily with supper. 10/06/20  Yes Nahser, Wonda Cheng, MD       Julian Hy, DO 04/01/21 1:20 PM [facility]

## 2021-04-01 NOTE — ED Triage Notes (Signed)
Patient is A&Ox4 came from home by EMS. Called EMS after having a 35min coughing spell. Patient began coughing up bright red blood. It was " splashing in the sink". EMS has not witnessed any hematemesis.  Vitals stable

## 2021-04-02 ENCOUNTER — Other Ambulatory Visit: Payer: Self-pay

## 2021-04-02 DIAGNOSIS — Z6831 Body mass index (BMI) 31.0-31.9, adult: Secondary | ICD-10-CM | POA: Diagnosis not present

## 2021-04-02 DIAGNOSIS — J440 Chronic obstructive pulmonary disease with acute lower respiratory infection: Secondary | ICD-10-CM | POA: Diagnosis present

## 2021-04-02 DIAGNOSIS — K219 Gastro-esophageal reflux disease without esophagitis: Secondary | ICD-10-CM | POA: Diagnosis present

## 2021-04-02 DIAGNOSIS — F419 Anxiety disorder, unspecified: Secondary | ICD-10-CM | POA: Diagnosis present

## 2021-04-02 DIAGNOSIS — Z8673 Personal history of transient ischemic attack (TIA), and cerebral infarction without residual deficits: Secondary | ICD-10-CM | POA: Diagnosis not present

## 2021-04-02 DIAGNOSIS — D6859 Other primary thrombophilia: Secondary | ICD-10-CM | POA: Diagnosis present

## 2021-04-02 DIAGNOSIS — I480 Paroxysmal atrial fibrillation: Secondary | ICD-10-CM | POA: Diagnosis present

## 2021-04-02 DIAGNOSIS — C771 Secondary and unspecified malignant neoplasm of intrathoracic lymph nodes: Secondary | ICD-10-CM | POA: Diagnosis present

## 2021-04-02 DIAGNOSIS — Z66 Do not resuscitate: Secondary | ICD-10-CM | POA: Diagnosis present

## 2021-04-02 DIAGNOSIS — J441 Chronic obstructive pulmonary disease with (acute) exacerbation: Secondary | ICD-10-CM | POA: Diagnosis present

## 2021-04-02 DIAGNOSIS — Z20822 Contact with and (suspected) exposure to covid-19: Secondary | ICD-10-CM | POA: Diagnosis present

## 2021-04-02 DIAGNOSIS — J189 Pneumonia, unspecified organism: Secondary | ICD-10-CM | POA: Diagnosis present

## 2021-04-02 DIAGNOSIS — Z885 Allergy status to narcotic agent status: Secondary | ICD-10-CM | POA: Diagnosis not present

## 2021-04-02 DIAGNOSIS — Z923 Personal history of irradiation: Secondary | ICD-10-CM | POA: Diagnosis not present

## 2021-04-02 DIAGNOSIS — C3492 Malignant neoplasm of unspecified part of left bronchus or lung: Secondary | ICD-10-CM | POA: Diagnosis present

## 2021-04-02 DIAGNOSIS — G4733 Obstructive sleep apnea (adult) (pediatric): Secondary | ICD-10-CM | POA: Diagnosis present

## 2021-04-02 DIAGNOSIS — E669 Obesity, unspecified: Secondary | ICD-10-CM | POA: Diagnosis present

## 2021-04-02 DIAGNOSIS — I10 Essential (primary) hypertension: Secondary | ICD-10-CM

## 2021-04-02 DIAGNOSIS — R042 Hemoptysis: Secondary | ICD-10-CM | POA: Diagnosis present

## 2021-04-02 DIAGNOSIS — G8929 Other chronic pain: Secondary | ICD-10-CM | POA: Diagnosis present

## 2021-04-02 DIAGNOSIS — Z880 Allergy status to penicillin: Secondary | ICD-10-CM | POA: Diagnosis not present

## 2021-04-02 DIAGNOSIS — J910 Malignant pleural effusion: Secondary | ICD-10-CM | POA: Diagnosis present

## 2021-04-02 DIAGNOSIS — I2510 Atherosclerotic heart disease of native coronary artery without angina pectoris: Secondary | ICD-10-CM | POA: Diagnosis present

## 2021-04-02 DIAGNOSIS — Z888 Allergy status to other drugs, medicaments and biological substances status: Secondary | ICD-10-CM | POA: Diagnosis not present

## 2021-04-02 HISTORY — DX: Chronic obstructive pulmonary disease with (acute) exacerbation: J44.1

## 2021-04-02 LAB — CBC
HCT: 33.6 % — ABNORMAL LOW (ref 39.0–52.0)
Hemoglobin: 10.6 g/dL — ABNORMAL LOW (ref 13.0–17.0)
MCH: 29.9 pg (ref 26.0–34.0)
MCHC: 31.5 g/dL (ref 30.0–36.0)
MCV: 94.9 fL (ref 80.0–100.0)
Platelets: 341 10*3/uL (ref 150–400)
RBC: 3.54 MIL/uL — ABNORMAL LOW (ref 4.22–5.81)
RDW: 16.1 % — ABNORMAL HIGH (ref 11.5–15.5)
WBC: 4.6 10*3/uL (ref 4.0–10.5)
nRBC: 0 % (ref 0.0–0.2)

## 2021-04-02 LAB — BASIC METABOLIC PANEL
Anion gap: 8 (ref 5–15)
BUN: 15 mg/dL (ref 8–23)
CO2: 24 mmol/L (ref 22–32)
Calcium: 9.4 mg/dL (ref 8.9–10.3)
Chloride: 104 mmol/L (ref 98–111)
Creatinine, Ser: 0.84 mg/dL (ref 0.61–1.24)
GFR, Estimated: >60 mL/min (ref >60)
Glucose, Bld: 89 mg/dL (ref 70–99)
Potassium: 3.8 mmol/L (ref 3.5–5.1)
Sodium: 136 mmol/L (ref 135–145)

## 2021-04-02 LAB — MAGNESIUM: Magnesium: 1.8 mg/dL (ref 1.7–2.4)

## 2021-04-02 MED ORDER — BENZONATATE 100 MG PO CAPS
100.0000 mg | ORAL_CAPSULE | Freq: Three times a day (TID) | ORAL | Status: DC
  Administered 2021-04-02 – 2021-04-03 (×4): 100 mg via ORAL
  Filled 2021-04-02 (×4): qty 1

## 2021-04-02 MED ORDER — HYDROCOD POLST-CPM POLST ER 10-8 MG/5ML PO SUER: 5.0000 mL | Freq: Two times a day (BID) | ORAL | Status: DC | PRN

## 2021-04-02 MED ORDER — POTASSIUM CHLORIDE CRYS ER 20 MEQ PO TBCR
20.0000 meq | EXTENDED_RELEASE_TABLET | Freq: Once | ORAL | Status: AC
  Administered 2021-04-02: 20 meq via ORAL
  Filled 2021-04-02: qty 1

## 2021-04-02 MED ORDER — METHYLPREDNISOLONE SODIUM SUCC 125 MG IJ SOLR
60.0000 mg | Freq: Three times a day (TID) | INTRAMUSCULAR | Status: DC
  Administered 2021-04-02 – 2021-04-04 (×6): 60 mg via INTRAVENOUS
  Filled 2021-04-02 (×6): qty 2

## 2021-04-02 MED ORDER — SODIUM CHLORIDE 0.9 % IV SOLN
12.5000 mg | Freq: Four times a day (QID) | INTRAVENOUS | Status: DC | PRN
  Filled 2021-04-02: qty 0.5

## 2021-04-02 MED ORDER — POLYETHYLENE GLYCOL 3350 17 G PO PACK
17.0000 g | PACK | Freq: Every day | ORAL | Status: DC | PRN
  Administered 2021-04-02: 17 g via ORAL
  Filled 2021-04-02: qty 1

## 2021-04-02 NOTE — Progress Notes (Signed)
04/02/2021   I have seen and evaluated the patient for hemoptysis.  S:  Hemoptysis resolved. Still intermittent wheezing. DOE not quite back to baseline.  O: Blood pressure 113/72, pulse 81, temperature 97.7 F (36.5 C), temperature source Axillary, resp. rate 20, height 6\' 2"  (1.88 m), weight 112.8 kg, SpO2 95 %.  No distress LLL with some crackles Ext warm PleurX site dressed without strikethrough  A:  Stage IV lung cancer on chemo Hemoptysis question tumor bleed vs. Bronchitis improved Afib on AC  P:  - Okay for empiric COPD/CAP tx - TXA nebs as ordered while he is here - Agree with LABA/LAMA on DC - Would rechallenge with xarelto on DC, if does not tolerate we may consider trying eliquis at 2.5mg  BID - Probably can get pleurX out soon, will touch base with Dr. Valeta Harms - Hopefully home tomorrow if continues to do well   Erskine Emery MD [facility] Prefer epic messenger for cross cover needs If after hours, please call E-link

## 2021-04-02 NOTE — Progress Notes (Addendum)
[facility]                                                                              Patient Demographics  Corey [name], is a 75 y.o. male, DOB - Apr 21, 1946, RWE:[id number]  Admit date - 04/01/2021   Admitting Physician Collier Bullock, MD  Outpatient Primary MD for the patient is Koirala, Dibas, MD  Outpatient specialists:   LOS - 0  days   Medical records reviewed and are as summarized below:    Chief Complaint  Patient presents with   Hematemesis       Brief summary   Patient is a 75 year old male with A. 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  Patient presented for evaluation due to hemoptysis which started in the early hours of the morning of admission.  Patient had 3 episodes of hemoptysis with bright red blood about 1 tablespoon each episode.  Denied any fevers or chills or any recent sick contacts.  Patient has cough occasionally productive of clear phlegm, wheezing. Per patient he takes albuterol inhaler at home however he was recently taken off Flovent.   Assessment & Plan    Principal Problem:   Cough with hemoptysis in the setting of stage IV NSCLC left lung, Pleurx catheter, on anticoagulation, COPD exacerbation, CAP pneumonia just -Patient was also noted to be wheezing at the time of admission -Xarelto held, per patient no further episodes of hemoptysis since yesterday -Still wheezing bilaterally, changed to IV steroids today, Solu-Medrol 60 mg every 8 hours -Continue DuoNebs every 6 hours -Continue IV antibiotics -As hemoptysis is improving, no further need of bronchoscopy-> diet changed to solids -Pulmonology following, recommended discharge on LAMA/LABA combination inhaler upon DC (Stiolto, Anoro or [employer])  Active Problems:   Benign essential HTN -BP currently  stable, continue metoprolol  Paroxysmal AF (paroxysmal atrial fibrillation) ([place]) with acquired thrombophilia, prolonged QTC -Heart rate controlled, continue dofetilide, metoprolol.  Currently in normal sinus rhythm. -Currently Xarelto held due to hemoptysis. CHADS2VASC 5. - d/w pulmonology, Dr. Tamala Julian, recommended to resume Xarelto at the time of discharge, outpatient follow-up with pulmonology.  If patient still has hemoptysis, will then transition to Eliquis 2.5 mg twice daily. - d/w cardiology, Dr. Nechama Guard, will notify EP cardiology regarding slightly prolonged QTC whether to hold Tikosyn at this time  Addendum 2:29pm Dr. Nechama Guard discussed with the EP and recommended to continue with Tikosyn at the current dose, no changes. Per EP, QTC is bit overestimated.     Non-small cell carcinoma of left lung, stage 4 (HCC) -Patient has completed XRT and received his first chemotherapy on 03/31/2021 -Oncology notified, d/w Dr [name], follow closely for any side effects -Felt nauseous today, added Phenergan   GERD -Continue PPI, Carafate  Obesity Estimated body mass index is 31.93 kg/m as calculated from the following:   Height as of this encounter: 6\' 2"  (1.88 m).   Weight as of this encounter: 112.8 kg.  Code Status: DNR DVT Prophylaxis:  SCDs Start: 04/01/21 0946  Level of Care: Level of care: Telemetry Family Communication: Discussed all imaging results, lab results, explained to the patient   Disposition Plan:     Status is: Observation  The patient will require care spanning > 2 midnights and should be moved to inpatient because: Inpatient level of care appropriate due to severity of illness  Dispo: The patient is from: Home              Anticipated d/c is to: Home              Patient currently is not medically stable to d/c.  Wheezing bilaterally, changed to IV steroids.  Will observe overnight.   Difficult to place patient No      Time Spent in minutes   35  minutes  Procedures:  None  Consultants:   Pulmonology Cardiology  Antimicrobials:   Anti-infectives (From admission, onward)    Start     Dose/Rate Route Frequency Ordered Stop   04/02/21 2200  cefTRIAXone (ROCEPHIN) 2 g in sodium chloride 0.9 % 100 mL IVPB        2 g 200 mL/hr over 30 Minutes Intravenous Every 24 hours 04/01/21 0954     04/02/21 1000  doxycycline (VIBRAMYCIN) 100 mg in sodium chloride 0.9 % 250 mL IVPB        100 mg 125 mL/hr over 120 Minutes Intravenous Every 12 hours 04/01/21 1110 04/06/21 0959   04/02/21 0000  azithromycin (ZITHROMAX) 500 mg in sodium chloride 0.9 % 250 mL IVPB  Status:  Discontinued        500 mg 250 mL/hr over 60 Minutes Intravenous Every 24 hours 04/01/21 0954 04/01/21 1110   04/01/21 0815  azithromycin (ZITHROMAX) 500 mg in sodium chloride 0.9 % 250 mL IVPB        500 mg 250 mL/hr over 60 Minutes Intravenous  Once 04/01/21 0804 04/01/21 1035   04/01/21 0815  cefTRIAXone (ROCEPHIN) 1 g in sodium chloride 0.9 % 100 mL IVPB        1 g 200 mL/hr over 30 Minutes Intravenous  Once 04/01/21 0804 04/01/21 0906          Medications  Scheduled Meds:  atorvastatin  40 mg Oral Daily   benzonatate  100 mg Oral TID   dofetilide  500 mcg Oral BID   fenofibrate  54 mg Oral Daily   fluticasone  2 spray Each Nare Daily   folic acid  1 mg Oral Daily   ipratropium-albuterol  3 mL Nebulization Q6H   magnesium oxide  400 mg Oral Daily   methylPREDNISolone (SOLU-MEDROL) injection  60 mg Intravenous Q8H   metoprolol succinate  25 mg Oral Daily   multivitamin with minerals  1 tablet Oral Daily   pantoprazole  40 mg Oral Daily   potassium chloride  10 mEq Oral Daily   saccharomyces boulardii  250 mg Oral Daily   sodium chloride flush  3 mL Intravenous Q12H   sucralfate  1 g Oral TID WC & HS   tamsulosin  0.4 mg Oral Daily   topiramate  100 mg Oral BID   tranexamic acid  500 mg Nebulization Q8H   [START ON 04/06/2021] Vitamin D (Ergocalciferol)   50,000 Units Oral Q7 days   Continuous Infusions:  sodium chloride     cefTRIAXone (ROCEPHIN)  IV     doxycycline (VIBRAMYCIN) IV     promethazine (PHENERGAN) injection (IM or IVPB)     PRN Meds:.sodium chloride, acetaminophen, Carbinoxamine Maleate,  chlorpheniramine-HYDROcodone, dicyclomine, nitroGLYCERIN, ondansetron **OR** ondansetron (ZOFRAN) IV, promethazine (PHENERGAN) injection (IM or IVPB), sodium chloride flush, traMADol      Subjective:   Nijel Flink was seen and examined today.  States no further hemoptysis however still has coughing.  Has wheezing bilaterally.  Patient denies dizziness, chest pain,  abdominal pain, N/V/D/C, new weakness, numbess, tingling. No acute events overnight.  No fevers.  Objective:   Vitals:   04/01/21 2013 04/01/21 2321 04/02/21 0409 04/02/21 0905  BP: 123/79 112/71 113/72   Pulse: 70 81 65 81  Resp: 16 16 18 20   Temp: 98.4 F (36.9 C) 98 F (36.7 C) 97.7 F (36.5 C)   TempSrc: Oral Oral Axillary   SpO2: 99% 97% 97% 95%  Weight:      Height:        Intake/Output Summary (Last 24 hours) at 04/02/2021 1036 Last data filed at 04/01/2021 2208 Gross per 24 hour  Intake 243 ml  Output --  Net 243 ml     Wt Readings from Last 3 Encounters:  04/01/21 112.8 kg  03/31/21 111.5 kg  03/27/21 112.2 kg     Exam General: Alert and oriented x 3, NAD, coughing Cardiovascular: S1 S2 auscultated, no murmurs, RRR Respiratory: Bilateral expiratory wheezing Gastrointestinal: Soft, nontender, nondistended, + bowel sounds Ext: no pedal edema bilaterally Neuro: no new deficits Skin: No rashes Psych: Normal affect and demeanor, alert and oriented x3    Data Reviewed:  I have personally reviewed following labs and imaging studies  Micro Results Recent Results (from the past 240 hour(s))  Blood culture (routine x 2)     Status: None (Preliminary result)   Collection Time: 04/01/21  8:46 AM   Specimen: BLOOD  Result Value Ref Range Status    Specimen Description   Final    BLOOD LEFT ANTECUBITAL Performed at [facility], [place] [address], [place], [place] [address]    Special Requests   Final    BOTTLES DRAWN AEROBIC AND ANAEROBIC Blood Culture results may not be optimal due to an inadequate volume of blood received in culture bottles Performed at [facility] [address] [place], [place] [address]    Culture   Final    NO GROWTH < 24 HOURS Performed at [facility] [address], [place], [place] [address]    Report Status PENDING  Incomplete  Blood culture (routine x 2)     Status: None (Preliminary result)   Collection Time: 04/01/21  8:46 AM   Specimen: BLOOD  Result Value Ref Range Status   Specimen Description   Final    BLOOD RIGHT ANTECUBITAL Performed at [facility] [address] [place], [place] [address]    Special Requests   Final    BOTTLES DRAWN AEROBIC AND ANAEROBIC Blood Culture results may not be optimal due to an inadequate volume of blood received in culture bottles Performed at [facility] [address], [place], [place] [address]    Culture   Final    NO GROWTH < 24 HOURS Performed at [facility] [address], [place], [place] [address]    Report Status PENDING  Incomplete  Expectorated Sputum Assessment w Gram Stain, Rflx to Resp Cult     Status: None   Collection Time: 04/01/21  2:30 PM   Specimen: Sputum  Result Value Ref Range Status   Specimen Description SPU  Final   Special Requests NONE  Final   Sputum evaluation   Final    Sputum specimen not acceptable for testing.  Please recollect.   Performed at [facility], [place] [address] [place], [place] [address]    Report Status 04/01/2021 FINAL  Final  Resp Panel by RT-PCR (Flu A&B, Covid) Nasopharyngeal Swab     Status: None   Collection Time: 04/01/21  2:54 PM   Specimen: Nasopharyngeal Swab; Nasopharyngeal(NP)  swabs in vial transport medium  Result Value Ref Range Status   SARS Coronavirus 2 by RT PCR NEGATIVE NEGATIVE Final    Comment: (NOTE) SARS-CoV-2 target nucleic acids are NOT DETECTED.  The SARS-CoV-2 RNA is generally detectable in upper respiratory specimens during the acute phase of infection. The lowest concentration of SARS-CoV-2 viral copies this assay can detect is 138 copies/mL. A negative result does not preclude SARS-Cov-2 infection and should not be used as the sole basis for treatment or other patient management decisions. A negative result may occur with  improper specimen collection/handling, submission of specimen other than nasopharyngeal swab, presence of viral mutation(s) within the areas targeted by this assay, and inadequate number of viral copies(<138 copies/mL). A negative result must be combined with clinical observations, patient history, and epidemiological information. The expected result is Negative.  Fact Sheet for Patients:  EntrepreneurPulse.com.au  Fact Sheet for Healthcare Providers:  IncredibleEmployment.be  This test is no t yet approved or cleared by the [place] FDA and  has been authorized for detection and/or diagnosis of SARS-CoV-2 by FDA under an Emergency Use Authorization (EUA). This EUA will remain  in effect (meaning this test can be used) for the duration of the COVID-19 declaration under Section 564(b)(1) of the Act, 21 U.S.C.section 360bbb-3(b)(1), unless the authorization is terminated  or revoked sooner.       Influenza A by PCR NEGATIVE NEGATIVE Final   Influenza B by PCR NEGATIVE NEGATIVE Final    Comment: (NOTE) The Xpert Xpress SARS-CoV-2/FLU/RSV plus assay is intended as an aid in the diagnosis of influenza from Nasopharyngeal swab specimens and should not be used as a sole basis for treatment. Nasal washings and aspirates are unacceptable for Xpert Xpress  SARS-CoV-2/FLU/RSV testing.  Fact Sheet for Patients: EntrepreneurPulse.com.au  Fact Sheet for Healthcare Providers: IncredibleEmployment.be  This test is not yet approved or cleared by the [place] FDA and has been authorized for detection and/or diagnosis of SARS-CoV-2 by FDA under an Emergency Use Authorization (EUA). This EUA will remain in effect (meaning this test can be used) for the duration of the COVID-19 declaration under Section 564(b)(1) of the Act, 21 U.S.C. section 360bbb-3(b)(1), unless the authorization is terminated or revoked.  Performed at [facility], [place] [address] [place], [place] [address]     Radiology Reports DG Chest 1 View  Result Date: 03/20/2021 CLINICAL DATA:  History of stage IV lung cancer. EXAM: CHEST  1 VIEW COMPARISON:  Radiograph 03/17/2021.  CT 02/24/2021 FINDINGS: Patient's left-sided PleurX catheter is not well-defined on the current exam, faintly visualized in the mid lower thorax. Left pleural effusion and hazy opacity at the left lung base, similar to prior exam allowing for differences in technique. Chronic left lung volume loss. Implanted loop recorder projects over the left chest wall. Stable heart size and mediastinal contours with aortic atherosclerosis the right lung is clear. No pneumothorax. IMPRESSION: Left-sided PleurX catheter is not well-defined on the current exam, faintly visualized in the mid lower thorax. Similar appearance of left pleural effusion and basilar  opacity to prior exam allowing for differences in technique. No pneumothorax. Electronically Signed   By: Keith Rake M.D.   On: 03/20/2021 21:14   DG Chest 2 View  Result Date: 03/09/2021 CLINICAL DATA:  pleural effusion EXAM: CHEST - 2 VIEW COMPARISON:  02/25/2021 FINDINGS: Unchanged cardiomediastinal silhouette. Cardiac device overlies the left mid chest. Decreased size of the left pleural effusion. There  is slight improved aeration in the left lung base. Of there is no visible pneumothorax. Bones are unchanged. IMPRESSION: Decreased size of the left pleural effusion with slight improved aeration in the left lung base. Electronically Signed   By: Maurine Simmering   On: 03/09/2021 09:45   CT Chest Wo Contrast  Result Date: 03/21/2021 CLINICAL DATA:  Chest pain and shortness of breath EXAM: CT CHEST WITHOUT CONTRAST TECHNIQUE: Multidetector CT imaging of the chest was performed following the standard protocol without IV contrast. COMPARISON:  None. FINDINGS: Cardiovascular: Calcific aortic atherosclerosis and coronary artery atherosclerosis. Trace pericardial fluid. Heart size is normal. Mediastinum/Nodes: 13 mm preaortic lymph node. 15 mm subcarinal lymph node. No axillary adenopathy. Esophagus is unremarkable. Lungs/Pleura: Filling defects in the left lower lobe bronchial tree with associated atelectasis. Right lung is clear. Small left pleural effusion. There is a tunneled pleural catheter at the left lung base. Unchanged nodular foci in the lingula. Upper Abdomen: No acute abnormality. Musculoskeletal: No chest wall mass or suspicious bone lesions identified. IMPRESSION: 1. Filling defects in the left lower lobe bronchial tree with associated atelectasis, likely mucous plugging. 2. Small left pleural effusion with tunneled pleural catheter at the left lung base. 3. Mildly enlarged mediastinal lymph nodes, likely reactive. Aortic Atherosclerosis (ICD10-I70.0). Electronically Signed   By: Ulyses Jarred M.D.   On: 03/21/2021 01:05   CT Angio Chest Pulmonary Embolism (PE) W or WO Contrast  Result Date: 04/01/2021 CLINICAL DATA:  75 year old male with hemoptysis, recent diagnosis of lung cancer. Pulmonary embolism suspected. EXAM: CT ANGIOGRAPHY CHEST WITH CONTRAST TECHNIQUE: Multidetector CT imaging of the chest was performed using the standard protocol during bolus administration of intravenous contrast.  Multiplanar CT image reconstructions and MIPs were obtained to evaluate the vascular anatomy. CONTRAST:  100 mL Omnipaque 350, intravenous COMPARISON:  03/21/2021, 03/24/2021 FINDINGS: Cardiovascular: Satisfactory opacification of the pulmonary arteries to the segmental level. No evidence of pulmonary embolism. Normal heart size. Severe coronary atherosclerotic calcifications. Scattered thoracic aortic atherosclerotic calcifications. No pericardial effusion. Mediastinum/Nodes: Multiple prominent aortopulmonic window lymph nodes, the largest measuring up to 14 mm in short axis, similar to comparison. The thyroid gland is within normal limits. The esophagus is mildly patulous throughout. Trachea is within normal limits. No axillary or supraclavicular lymphadenopathy. Lungs/Pleura: Similar appearing central left lower lobe opacifications with associated architectural distortion, difficult to measure giving extent from the left lung base to the hilum, overall similar to recent comparisons. Similar appearing left basilar pleural thickening and multifocal subpleural left upper and lower lobe nodular opacities, the largest measuring up to 12 mm in the lingula. Interval development of element of diffuse ground-glass and reticular opacifications in the left lower lobe. No pneumothorax. The right lung is clear. Upper Abdomen: Similar appearing hypodense, poorly defined mass in the caudate lobe of the liver measuring up to approximately 3.6 cm. The remaining visualized upper abdomen is within normal limits. Musculoskeletal: Partially visualized anterior cervical fusion hardware in place without complicating features. Multilevel degenerative changes of the thoracic spine. No acute osseous abnormality or aggressive appearing osseous lesion. Review of the MIP images confirms the  above findings. IMPRESSION: Vascular: 1. No pulmonary embolism. 2. Coronary and aortic atherosclerosis (ICD10-I70.0). Non-Vascular: 1. Interval  development of ground-glass and reticular opacities in the left lower lobe as could be seen with progressive lymphangitic carcinomatosis secondary to known advanced stage left lower lobe lung mass. Superimposed pneumonia could appear similarly. 2. Overall similar appearance of poorly defined, extensive central left lower lobe mass and associated left basilar pleural thickening and scattered left upper and lower lobe pleural nodularity. 3. Similar appearing hypoattenuating caudate lobe mass and left mediastinal lymphadenopathy compatible with known metastatic disease. Ruthann Cancer, MD [facility] Electronically Signed   By: Ruthann Cancer MD   On: 04/01/2021 07:30   MR BRAIN W WO CONTRAST  Result Date: 03/19/2021 CLINICAL DATA:  Staging of non-small cell lung carcinoma EXAM: MRI HEAD WITHOUT AND WITH CONTRAST TECHNIQUE: Multiplanar, multiecho pulse sequences of the brain and surrounding structures were obtained without and with intravenous contrast. CONTRAST:  29mL GADAVIST GADOBUTROL 1 MMOL/ML IV SOLN COMPARISON:  07/06/2012 FINDINGS: Brain: No acute infarct, mass effect or extra-axial collection. No acute or chronic hemorrhage. Hyperintense T2-weighted signal is moderately widespread throughout the white matter. Generalized volume loss without a clear lobar predilection. Old left parietal infarct. The midline structures are normal. There is no abnormal contrast enhancement. Vascular: Major flow voids are preserved. Skull and upper cervical spine: Normal calvarium and skull base. Visualized upper cervical spine and soft tissues are normal. Sinuses/Orbits:No paranasal sinus fluid levels or advanced mucosal thickening. No mastoid or middle ear effusion. Normal orbits. IMPRESSION: 1. No intracranial metastatic disease. 2. 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 Electronically Signed   By: Ulyses Jarred M.D.   On: 03/19/2021  22:06   NM PET Image Initial (PI) Skull Base To Thigh  Result Date: 03/25/2021 CLINICAL DATA:  Initial treatment strategy for lung cancer. EXAM: NUCLEAR MEDICINE PET SKULL BASE TO THIGH TECHNIQUE: 11.3 mCi F-18 FDG was injected intravenously. Full-ring PET imaging was performed from the skull base to thigh after the radiotracer. CT data was obtained and used for attenuation correction and anatomic localization. Fasting blood glucose: 96 mg/dl COMPARISON:  CT chest 03/21/2021, 02/24/2021. FINDINGS: Mediastinal blood pool activity: SUV max 2.6 Liver activity: SUV max NA NECK: No abnormal hypermetabolism. Incidental CT findings: None. CHEST: Mediastinal lymph nodes measure up to 11 mm in the AP window an SUV max 23. Subcarinal lymph nodes measure up to 1.4 cm with an SUV max 15.0. Left hilar hypermetabolism, SUV max 11.0. Prepericardiac lymph nodes measure up to 11 mm with an SUV max of 8.6. Central left lower lobe mass is difficult to measure without IV contrast but dimensions are roughly 2.8 x 3.8 cm with an SUV max 14.8. Pleural thickening and hypermetabolic pleural/subpleural nodularity in the left hemithorax. Index subpleural lingular nodule measures 11 mm (8/41) with an SUV max of 9.1. Incidental CT findings: Interstitial thickening and nodularity in the left lower lobe, worrisome for lymphangitic carcinomatosis. Partial obstruction of the left lower lobe bronchus. Atherosclerotic calcification of the aorta, aortic valve and coronary arteries. Heart is at the upper limits of normal in size to mildly enlarged. No pericardial effusion. Thin rind of pleural fluid at the base of the left hemithorax with a percutaneous drain in place. ABDOMEN/PELVIS: Hypermetabolic lesions are seen in the dome of the liver, right hepatic lobe and caudate. Caudate mass measures 3.7 x 4.6 cm with an SUV max of 19.0. No abnormal hypermetabolism in the adrenal glands,  spleen or pancreas. Hypermetabolic celiac axis lymph node measures  7 mm (4/119) with an SUV max of 7.8. No additional hypermetabolic lymph nodes. Incidental CT findings: Liver, gallbladder, adrenal glands and right kidney are unremarkable. Subcentimeter high and low-attenuation lesions in the left kidney are too small to characterize. Spleen, pancreas, stomach and bowel are unremarkable. Atherosclerotic calcification of the aorta. SKELETON: No abnormal hypermetabolism. Incidental CT findings: Degenerative and postoperative changes in the spine. IMPRESSION: 1. Hypermetabolic left lower lobe mass with hypermetabolic intrathoracic and upper abdominal lymph nodes, left pleural/subpleural nodularity and hepatic lesions, consistent with stage IV primary bronchogenic carcinoma. 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 2. Small loculated left pleural effusion with pleural drainage catheter in place. 3.  Aortic atherosclerosis (ICD10-I70.0). Electronically Signed   By: Lorin Picket M.D.   On: 03/25/2021 10:17   DG CHEST PORT 1 VIEW  Result Date: 03/17/2021 CLINICAL DATA:  Status post PleurX catheter placement EXAM: PORTABLE CHEST 1 VIEW COMPARISON:  04/08/2021 FINDINGS: Cardiac shadow is enlarged. Loop recorder is again identified. New PleurX catheter is noted on the left. Small residual effusion remains. No pneumothorax is identified. IMPRESSION: PleurX catheter in satisfactory position. Small residual effusion is noted. No pneumothorax is seen. Electronically Signed   By: Inez Catalina M.D.   On: 03/17/2021 16:00   CUP PACEART REMOTE DEVICE CHECK  Result Date: 03/30/2021 ILR summary report received. Battery status OK. Normal device function. No new symptom, tachy, brady, or pause episodes. No new AF episodes. Monthly summary reports and ROV/PRN   Lab Data:  CBC: Recent Labs  Lab 03/31/21 1020 04/01/21 0542 04/01/21 1422 04/02/21 0540  WBC 4.8 4.3 5.6 4.6  NEUTROABS 3.6  --   --   --   HGB 10.5* 9.6* 9.8* 10.6*  HCT 32.0* 30.9* 31.2* 33.6*  MCV  90.9 93.1 94.3 94.9  PLT 311 325 315 097   Basic Metabolic Panel: Recent Labs  Lab 03/31/21 1020 04/01/21 0542 04/02/21 0540  NA 135 135 136  K 3.6 4.1 3.8  CL 105 105 104  CO2 20* 21* 24  GLUCOSE 120* 126* 89  BUN 19 17 15   CREATININE 0.85 0.61 0.84  CALCIUM 9.2 9.3 9.4  MG  --   --  1.8   GFR: Estimated Creatinine Clearance: 104.6 mL/min (by C-G formula based on SCr of 0.84 mg/dL). Liver Function Tests: Recent Labs  Lab 03/31/21 1020  AST 14*  ALT 12  ALKPHOS 43  BILITOT 0.4  PROT 6.4*  ALBUMIN 2.9*   No results for input(s): LIPASE, AMYLASE in the last 168 hours. No results for input(s): AMMONIA in the last 168 hours. Coagulation Profile: Recent Labs  Lab 04/01/21 0542  INR 1.4*   Cardiac Enzymes: No results for input(s): CKTOTAL, CKMB, CKMBINDEX, TROPONINI in the last 168 hours. BNP (last 3 results) No results for input(s): PROBNP in the last 8760 hours. HbA1C: No results for input(s): HGBA1C in the last 72 hours. CBG: No results for input(s): GLUCAP in the last 168 hours. Lipid Profile: No results for input(s): CHOL, HDL, LDLCALC, TRIG, CHOLHDL, LDLDIRECT in the last 72 hours. Thyroid Function Tests: Recent Labs    03/31/21 1019  TSH 1.252   Anemia Panel: No results for input(s): VITAMINB12, FOLATE, FERRITIN, TIBC, IRON, RETICCTPCT in the last 72 hours. Urine analysis:    Component Value Date/Time   COLORURINE AMBER (A) 06/04/2016 1304   APPEARANCEUR CLEAR 06/04/2016 1304   LABSPEC 1.019 06/04/2016 1304   PHURINE 5.5 06/04/2016  [place] 06/04/2016 1304   HGBUR NEGATIVE 06/04/2016 1304   BILIRUBINUR NEGATIVE 06/04/2016 1304   KETONESUR NEGATIVE 06/04/2016 1304   PROTEINUR NEGATIVE 06/04/2016 1304   NITRITE NEGATIVE 06/04/2016 1304   LEUKOCYTESUR SMALL (A) 06/04/2016 1304     Neriyah Cercone M.D. [facility] 04/02/2021, 10:36 AM  Available via Epic secure chat 7am-7pm After 7 pm, please refer to night coverage  provider listed on amion.

## 2021-04-03 ENCOUNTER — Encounter: Payer: Self-pay | Admitting: Internal Medicine

## 2021-04-03 ENCOUNTER — Other Ambulatory Visit (HOSPITAL_COMMUNITY): Payer: Self-pay

## 2021-04-03 DIAGNOSIS — I480 Paroxysmal atrial fibrillation: Secondary | ICD-10-CM | POA: Diagnosis not present

## 2021-04-03 DIAGNOSIS — I10 Essential (primary) hypertension: Secondary | ICD-10-CM | POA: Diagnosis not present

## 2021-04-03 DIAGNOSIS — R042 Hemoptysis: Secondary | ICD-10-CM | POA: Diagnosis not present

## 2021-04-03 DIAGNOSIS — J189 Pneumonia, unspecified organism: Secondary | ICD-10-CM | POA: Diagnosis not present

## 2021-04-03 LAB — CBC
HCT: 30.1 % — ABNORMAL LOW (ref 39.0–52.0)
Hemoglobin: 9.5 g/dL — ABNORMAL LOW (ref 13.0–17.0)
MCH: 29.6 pg (ref 26.0–34.0)
MCHC: 31.6 g/dL (ref 30.0–36.0)
MCV: 93.8 fL (ref 80.0–100.0)
Platelets: 284 10*3/uL (ref 150–400)
RBC: 3.21 MIL/uL — ABNORMAL LOW (ref 4.22–5.81)
RDW: 15.9 % — ABNORMAL HIGH (ref 11.5–15.5)
WBC: 4.5 10*3/uL (ref 4.0–10.5)
nRBC: 0 % (ref 0.0–0.2)

## 2021-04-03 LAB — BASIC METABOLIC PANEL
Anion gap: 8 (ref 5–15)
BUN: 20 mg/dL (ref 8–23)
CO2: 23 mmol/L (ref 22–32)
Calcium: 9.1 mg/dL (ref 8.9–10.3)
Chloride: 103 mmol/L (ref 98–111)
Creatinine, Ser: 0.85 mg/dL (ref 0.61–1.24)
GFR, Estimated: >60 mL/min (ref >60)
Glucose, Bld: 142 mg/dL — ABNORMAL HIGH (ref 70–99)
Potassium: 4.3 mmol/L (ref 3.5–5.1)
Sodium: 134 mmol/L — ABNORMAL LOW (ref 135–145)

## 2021-04-03 MED ORDER — HYDROCOD POLST-CPM POLST ER 10-8 MG/5ML PO SUER
5.0000 mL | Freq: Two times a day (BID) | ORAL | Status: DC
Start: 2021-04-03 — End: 2021-04-04
  Administered 2021-04-03 – 2021-04-04 (×2): 5 mL via ORAL
  Filled 2021-04-03 (×3): qty 5

## 2021-04-03 MED ORDER — RIVAROXABAN 20 MG PO TABS
20.0000 mg | ORAL_TABLET | Freq: Every day | ORAL | Status: DC
  Administered 2021-04-03: 20 mg via ORAL
  Filled 2021-04-03: qty 1

## 2021-04-03 MED ORDER — IPRATROPIUM-ALBUTEROL 0.5-2.5 (3) MG/3ML IN SOLN
3.0000 mL | Freq: Three times a day (TID) | RESPIRATORY_TRACT | Status: DC
  Administered 2021-04-03 – 2021-04-04 (×2): 3 mL via RESPIRATORY_TRACT
  Filled 2021-04-03 (×2): qty 3

## 2021-04-03 MED ORDER — PROMETHAZINE HCL 6.25 MG/5ML PO SYRP
12.5000 mg | ORAL_SOLUTION | Freq: Four times a day (QID) | ORAL | Status: DC | PRN
  Filled 2021-04-03: qty 20

## 2021-04-03 MED ORDER — BENZONATATE 100 MG PO CAPS
200.0000 mg | ORAL_CAPSULE | Freq: Three times a day (TID) | ORAL | Status: DC
  Administered 2021-04-03 – 2021-04-04 (×3): 200 mg via ORAL
  Filled 2021-04-03 (×3): qty 2

## 2021-04-03 NOTE — Progress Notes (Signed)
Chaplain engaged in an initial visit with Kaleth and Mrs. Thurman.  They shared about Aarron's healthcare journey and the many things they have learned along the way.  Lenwood has learned how to advocate for what he needs, including when he knows to stay in the hospital longer.  They shared about different hospital stays and experiences that have allowed for them to fight for what they need in healthcare now.  Willet is very aware of his body and does not take any chances when it comes to getting the care he needs.    Chaplain did a check in with Sarkis after hearing about the multiple health crises he has faced.  He noted that he is doing well and that he has learned to keep going.  Chaplain affirmed the positive mindset and mental fortitude Sahas has concerning his health and body.  They both have learned to look for the "silver lining."  Chaplain offered support, listening and presence.      04/03/21 1300  Clinical Encounter Type  Visited With Patient and family together  Visit Type Initial

## 2021-04-03 NOTE — Progress Notes (Signed)
04/03/2021   I have seen and evaluated the patient for hemoptysis.  S:  Breathing improved. Still cough paroxysms.  O: Blood pressure 127/68, pulse 85, temperature (!) 97.5 F (36.4 C), temperature source Oral, resp. rate 18, height 6\' 2"  (1.88 m), weight 112.8 kg, SpO2 94 %.  No distress LLL with some crackles, occasional wheezing Ext warm PleurX site dressed without strikethrough AOx3  A:  Stage IV lung cancer on chemo Hemoptysis question tumor bleed vs.more likely bronchitis improved Afib on AC  P:  - DC TXA nebs - Tx of AECOPD reasonable - Rechallenge with xarelto - PleurX currently draining < 300cc q3 d, he will keep track and f/u with Dr. Valeta Harms in August - Trial of flutter valve and PRN phenergan vs. Tussionex vs.  cough syrup - If no further hemoptysis by AM, fine for DC from my standpoint, please reach out if any questions or concerns   Erskine Emery MD [facility] Max epic messenger for cross cover needs If after hours, please call E-link

## 2021-04-03 NOTE — Progress Notes (Signed)
[facility]                                                                              Patient Demographics  Corey [name], is a 75 y.o. male, DOB - 17-Jan-1946, OFB:[id number]  Admit date - 04/01/2021   Admitting Physician Collier Bullock, MD  Outpatient Primary MD for the patient is Koirala, Dibas, MD  Outpatient specialists:   LOS - 1  days   Medical records reviewed and are as summarized below:    Chief Complaint  Patient presents with   Hematemesis       Brief summary   Patient is a 75 year old male with A. 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  Patient presented for evaluation due to hemoptysis which started in the early hours of the morning of admission.  Patient had 3 episodes of hemoptysis with bright red blood about 1 tablespoon each episode.  Denied any fevers or chills or any recent sick contacts.  Patient has cough occasionally productive of clear phlegm, wheezing. Per patient he takes albuterol inhaler at home however he was recently taken off Flovent.   Assessment & Plan    Principal Problem:   Cough with hemoptysis in the setting of stage IV NSCLC left lung, Pleurx catheter, on anticoagulation, COPD exacerbation, CAP pneumonia just -Patient was also noted to be wheezing at the time of admission -Wheezing is improving, although not completely resolved, will continue IV Solu-Medrol today. -Continue DuoNebs, IV Rocephin, doxycycline -As hemoptysis resolved, no further need of bronchoscopy -Pulmonology following, recommended discharge on LAMA/LABA combination inhaler upon DC (Stiolto, Anoro or [employer]) -Cough continues to remain bothersome, increase to Tessalon 200 mg 3 times daily, changed Tussionex to every 12 hours   Active Problems:   Benign essential HTN -Currently  stable, continue metoprolol  Paroxysmal AF (paroxysmal atrial fibrillation) (HCC) with acquired thrombophilia, prolonged QTC - Xarelto was held due to hemoptysis. CHADS2VASC 5. -Started on Xarelto, will follow H&H, if any hemoptysis reoccurs  -Continue Tikosyn, metoprolol, heart rate controlled     Non-small cell carcinoma of left lung, stage 4 (HCC) -Patient has completed XRT and received his first chemotherapy on 03/31/2021 -Oncology notified, d/w Dr [name], follow closely for any side effects -Currently stable, no acute issues, continue antiemetics as needed  GERD -Continue PPI, Carafate  Obesity Estimated body mass index is 31.93 kg/m as calculated from the following:   Height as of this encounter: 6\' 2"  (1.88 m).   Weight as of this encounter: 112.8 kg.  Code Status: DNR DVT Prophylaxis:  SCDs Start: 04/01/21 0946 rivaroxaban (XARELTO) tablet 20 mg   Level of Care: Level of care: Telemetry Family Communication: Discussed all imaging results, lab results, explained to the patient   Disposition Plan:     Status is: Observation  The patient will require care spanning > 2 midnights and should be moved to inpatient because: Inpatient level of care appropriate due to severity of illness  Dispo: The patient is from: Home  Anticipated d/c is to: Home              Patient currently is not medically stable to d/c.  Overall improving, started on Xarelto today, will follow H&H.  If remains stable and no further hemoptysis, likely DC home in a.m.    difficult to place patient No      Time Spent in minutes   35 minutes  Procedures:  None  Consultants:   Pulmonology Cardiology  Antimicrobials:   Anti-infectives (From admission, onward)    Start     Dose/Rate Route Frequency Ordered Stop   04/02/21 2200  cefTRIAXone (ROCEPHIN) 2 g in sodium chloride 0.9 % 100 mL IVPB        2 g 200 mL/hr over 30 Minutes Intravenous Every 24 hours 04/01/21 0954     04/02/21  1000  doxycycline (VIBRAMYCIN) 100 mg in sodium chloride 0.9 % 250 mL IVPB        100 mg 125 mL/hr over 120 Minutes Intravenous Every 12 hours 04/01/21 1110 04/06/21 0959   04/02/21 0000  azithromycin (ZITHROMAX) 500 mg in sodium chloride 0.9 % 250 mL IVPB  Status:  Discontinued        500 mg 250 mL/hr over 60 Minutes Intravenous Every 24 hours 04/01/21 0954 04/01/21 1110   04/01/21 0815  azithromycin (ZITHROMAX) 500 mg in sodium chloride 0.9 % 250 mL IVPB        500 mg 250 mL/hr over 60 Minutes Intravenous  Once 04/01/21 0804 04/01/21 1035   04/01/21 0815  cefTRIAXone (ROCEPHIN) 1 g in sodium chloride 0.9 % 100 mL IVPB        1 g 200 mL/hr over 30 Minutes Intravenous  Once 04/01/21 0804 04/01/21 0906          Medications  Scheduled Meds:  atorvastatin  40 mg Oral Daily   benzonatate  100 mg Oral TID   dofetilide  500 mcg Oral BID   fenofibrate  54 mg Oral Daily   fluticasone  2 spray Each Nare Daily   folic acid  1 mg Oral Daily   ipratropium-albuterol  3 mL Nebulization Q6H   magnesium oxide  400 mg Oral Daily   methylPREDNISolone (SOLU-MEDROL) injection  60 mg Intravenous Q8H   metoprolol succinate  25 mg Oral Daily   multivitamin with minerals  1 tablet Oral Daily   pantoprazole  40 mg Oral Daily   potassium chloride  10 mEq Oral Daily   rivaroxaban  20 mg Oral Q supper   saccharomyces boulardii  250 mg Oral Daily   sodium chloride flush  3 mL Intravenous Q12H   sucralfate  1 g Oral TID WC & HS   tamsulosin  0.4 mg Oral Daily   topiramate  100 mg Oral BID   [START ON 04/06/2021] Vitamin D (Ergocalciferol)  50,000 Units Oral Q7 days   Continuous Infusions:  sodium chloride     cefTRIAXone (ROCEPHIN)  IV 2 g (04/02/21 2230)   doxycycline (VIBRAMYCIN) IV 100 mg (04/03/21 0957)   PRN Meds:.sodium chloride, acetaminophen, Carbinoxamine Maleate, chlorpheniramine-HYDROcodone, dicyclomine, nitroGLYCERIN, ondansetron **OR** ondansetron (ZOFRAN) IV, polyethylene glycol,  promethazine, sodium chloride flush, traMADol      Subjective:   Corey [name] was seen and examined today.  No acute complaints.  Still has some wheezing, overall shortness of breath is improving.  Still has coughing but no further hemoptysis.  Patient denies dizziness, chest pain,  abdominal pain, N/V/D/C, new weakness, numbess, tingling. No acute events  overnight.  No fevers.  Objective:   Vitals:   04/03/21 0227 04/03/21 0522 04/03/21 0839 04/03/21 1301  BP:  127/68  113/68  Pulse:  85  81  Resp:  18  18  Temp:  (!) 97.5 F (36.4 C)  98.3 F (36.8 C)  TempSrc:  Oral  Oral  SpO2: 94% 98% 94% 96%  Weight:      Height:        Intake/Output Summary (Last 24 hours) at 04/03/2021 1405 Last data filed at 04/03/2021 0529 Gross per 24 hour  Intake 593.24 ml  Output 300 ml  Net 293.24 ml     Wt Readings from Last 3 Encounters:  04/01/21 112.8 kg  03/31/21 111.5 kg  03/27/21 112.2 kg   Physical Exam General: Alert and oriented x 3, NAD Cardiovascular: S1 S2 clear, RRR. No pedal edema b/l Respiratory: Mild expiratory wheezing on the left, right lung minimal wheezing Gastrointestinal: Soft, nontender, nondistended, NBS Ext: no pedal edema bilaterally Neuro: no new deficits Psych: Normal affect and demeanor, alert and oriented x3    Data Reviewed:  I have personally reviewed following labs and imaging studies  Micro Results Recent Results (from the past 240 hour(s))  Blood culture (routine x 2)     Status: None (Preliminary result)   Collection Time: 04/01/21  8:46 AM   Specimen: BLOOD  Result Value Ref Range Status   Specimen Description   Final    BLOOD LEFT ANTECUBITAL Performed at [facility] [address], [place], [place] [address]    Special Requests   Final    BOTTLES DRAWN AEROBIC AND ANAEROBIC Blood Culture results may not be optimal due to an inadequate volume of blood received in culture bottles Performed at [facility] [address], [place], [place] [address]    Culture   Final    NO GROWTH 2 DAYS Performed at [facility] [address] [place], [place] [address]    Report Status PENDING  Incomplete  Blood culture (routine x 2)     Status: None (Preliminary result)   Collection Time: 04/01/21  8:46 AM   Specimen: BLOOD  Result Value Ref Range Status   Specimen Description   Final    BLOOD RIGHT ANTECUBITAL Performed at [facility] [address] [place], [place] [address]    Special Requests   Final    BOTTLES DRAWN AEROBIC AND ANAEROBIC Blood Culture results may not be optimal due to an inadequate volume of blood received in culture bottles Performed at [facility] [address] [place], [place] [address]    Culture   Final    NO GROWTH 2 DAYS Performed at [facility] [address], [place], [place] [address]    Report Status PENDING  Incomplete  Expectorated Sputum Assessment w Gram Stain, Rflx to Resp Cult     Status: None   Collection Time: 04/01/21  2:30 PM   Specimen: Sputum  Result Value Ref Range Status   Specimen Description SPU  Final   Special Requests NONE  Final   Sputum evaluation   Final    Sputum specimen not acceptable for testing.  Please recollect.   Performed at [facility], [place] [address] [place], [place] [address]    Report Status 04/01/2021 FINAL  Final  Resp Panel by RT-PCR (Flu A&B, Covid) Nasopharyngeal Swab     Status: None   Collection  Time: 04/01/21  2:54 PM   Specimen: Nasopharyngeal Swab; Nasopharyngeal(NP) swabs in vial transport medium  Result Value Ref Range Status   SARS Coronavirus 2 by RT PCR NEGATIVE NEGATIVE Final    Comment: (NOTE) SARS-CoV-2 target nucleic acids are NOT DETECTED.  The SARS-CoV-2 RNA is generally detectable in upper respiratory specimens during the acute phase of infection. The lowest concentration of SARS-CoV-2 viral copies this assay  can detect is 138 copies/mL. A negative result does not preclude SARS-Cov-2 infection and should not be used as the sole basis for treatment or other patient management decisions. A negative result may occur with  improper specimen collection/handling, submission of specimen other than nasopharyngeal swab, presence of viral mutation(s) within the areas targeted by this assay, and inadequate number of viral copies(<138 copies/mL). A negative result must be combined with clinical observations, patient history, and epidemiological information. The expected result is Negative.  Fact Sheet for Patients:  EntrepreneurPulse.com.au  Fact Sheet for Healthcare Providers:  IncredibleEmployment.be  This test is no t yet approved or cleared by the [place] FDA and  has been authorized for detection and/or diagnosis of SARS-CoV-2 by FDA under an Emergency Use Authorization (EUA). This EUA will remain  in effect (meaning this test can be used) for the duration of the COVID-19 declaration under Section 564(b)(1) of the Act, 21 U.S.C.section 360bbb-3(b)(1), unless the authorization is terminated  or revoked sooner.       Influenza A by PCR NEGATIVE NEGATIVE Final   Influenza B by PCR NEGATIVE NEGATIVE Final    Comment: (NOTE) The Xpert Xpress SARS-CoV-2/FLU/RSV plus assay is intended as an aid in the diagnosis of influenza from Nasopharyngeal swab specimens and should not be used as a sole basis for treatment. Nasal washings and aspirates are unacceptable for Xpert Xpress SARS-CoV-2/FLU/RSV testing.  Fact Sheet for Patients: EntrepreneurPulse.com.au  Fact Sheet for Healthcare Providers: IncredibleEmployment.be  This test is not yet approved or cleared by the [place] FDA and has been authorized for detection and/or diagnosis of SARS-CoV-2 by FDA under an Emergency Use Authorization (EUA). This EUA will  remain in effect (meaning this test can be used) for the duration of the COVID-19 declaration under Section 564(b)(1) of the Act, 21 U.S.C. section 360bbb-3(b)(1), unless the authorization is terminated or revoked.  Performed at [facility], [place] [address] [place], [place] [address]     Radiology Reports DG Chest 1 View  Result Date: 03/20/2021 CLINICAL DATA:  History of stage IV lung cancer. EXAM: CHEST  1 VIEW COMPARISON:  Radiograph 03/17/2021.  CT 02/24/2021 FINDINGS: Patient's left-sided PleurX catheter is not well-defined on the current exam, faintly visualized in the mid lower thorax. Left pleural effusion and hazy opacity at the left lung base, similar to prior exam allowing for differences in technique. Chronic left lung volume loss. Implanted loop recorder projects over the left chest wall. Stable heart size and mediastinal contours with aortic atherosclerosis the right lung is clear. No pneumothorax. IMPRESSION: Left-sided PleurX catheter is not well-defined on the current exam, faintly visualized in the mid lower thorax. Similar appearance of left pleural effusion and basilar opacity to prior exam allowing for differences in technique. No pneumothorax. Electronically Signed   By: Keith Rake M.D.   On: 03/20/2021 21:14   DG Chest 2 View  Result Date: 03/09/2021 CLINICAL DATA:  pleural effusion EXAM: CHEST - 2 VIEW COMPARISON:  02/25/2021 FINDINGS: Unchanged cardiomediastinal silhouette. Cardiac device overlies the left mid chest. Decreased size of the  left pleural effusion. There is slight improved aeration in the left lung base. Of there is no visible pneumothorax. Bones are unchanged. IMPRESSION: Decreased size of the left pleural effusion with slight improved aeration in the left lung base. Electronically Signed   By: Maurine Simmering   On: 03/09/2021 09:45   CT Chest Wo Contrast  Result Date: 03/21/2021 CLINICAL DATA:  Chest pain and shortness of breath EXAM:  CT CHEST WITHOUT CONTRAST TECHNIQUE: Multidetector CT imaging of the chest was performed following the standard protocol without IV contrast. COMPARISON:  None. FINDINGS: Cardiovascular: Calcific aortic atherosclerosis and coronary artery atherosclerosis. Trace pericardial fluid. Heart size is normal. Mediastinum/Nodes: 13 mm preaortic lymph node. 15 mm subcarinal lymph node. No axillary adenopathy. Esophagus is unremarkable. Lungs/Pleura: Filling defects in the left lower lobe bronchial tree with associated atelectasis. Right lung is clear. Small left pleural effusion. There is a tunneled pleural catheter at the left lung base. Unchanged nodular foci in the lingula. Upper Abdomen: No acute abnormality. Musculoskeletal: No chest wall mass or suspicious bone lesions identified. IMPRESSION: 1. Filling defects in the left lower lobe bronchial tree with associated atelectasis, likely mucous plugging. 2. Small left pleural effusion with tunneled pleural catheter at the left lung base. 3. Mildly enlarged mediastinal lymph nodes, likely reactive. Aortic Atherosclerosis (ICD10-I70.0). Electronically Signed   By: Ulyses Jarred M.D.   On: 03/21/2021 01:05   CT Angio Chest Pulmonary Embolism (PE) W or WO Contrast  Result Date: 04/01/2021 CLINICAL DATA:  75 year old male with hemoptysis, recent diagnosis of lung cancer. Pulmonary embolism suspected. EXAM: CT ANGIOGRAPHY CHEST WITH CONTRAST TECHNIQUE: Multidetector CT imaging of the chest was performed using the standard protocol during bolus administration of intravenous contrast. Multiplanar CT image reconstructions and MIPs were obtained to evaluate the vascular anatomy. CONTRAST:  100 mL Omnipaque 350, intravenous COMPARISON:  03/21/2021, 03/24/2021 FINDINGS: Cardiovascular: Satisfactory opacification of the pulmonary arteries to the segmental level. No evidence of pulmonary embolism. Normal heart size. Severe coronary atherosclerotic calcifications. Scattered thoracic  aortic atherosclerotic calcifications. No pericardial effusion. Mediastinum/Nodes: Multiple prominent aortopulmonic window lymph nodes, the largest measuring up to 14 mm in short axis, similar to comparison. The thyroid gland is within normal limits. The esophagus is mildly patulous throughout. Trachea is within normal limits. No axillary or supraclavicular lymphadenopathy. Lungs/Pleura: Similar appearing central left lower lobe opacifications with associated architectural distortion, difficult to measure giving extent from the left lung base to the hilum, overall similar to recent comparisons. Similar appearing left basilar pleural thickening and multifocal subpleural left upper and lower lobe nodular opacities, the largest measuring up to 12 mm in the lingula. Interval development of element of diffuse ground-glass and reticular opacifications in the left lower lobe. No pneumothorax. The right lung is clear. Upper Abdomen: Similar appearing hypodense, poorly defined mass in the caudate lobe of the liver measuring up to approximately 3.6 cm. The remaining visualized upper abdomen is within normal limits. Musculoskeletal: Partially visualized anterior cervical fusion hardware in place without complicating features. Multilevel degenerative changes of the thoracic spine. No acute osseous abnormality or aggressive appearing osseous lesion. Review of the MIP images confirms the above findings. IMPRESSION: Vascular: 1. No pulmonary embolism. 2. Coronary and aortic atherosclerosis (ICD10-I70.0). Non-Vascular: 1. Interval development of ground-glass and reticular opacities in the left lower lobe as could be seen with progressive lymphangitic carcinomatosis secondary to known advanced stage left lower lobe lung mass. Superimposed pneumonia could appear similarly. 2. Overall similar appearance of poorly defined, extensive central left lower lobe  mass and associated left basilar pleural thickening and scattered left upper and  lower lobe pleural nodularity. 3. Similar appearing hypoattenuating caudate lobe mass and left mediastinal lymphadenopathy compatible with known metastatic disease. Ruthann Cancer, MD [facility] Electronically Signed   By: Ruthann Cancer MD   On: 04/01/2021 07:30   MR BRAIN W WO CONTRAST  Result Date: 03/19/2021 CLINICAL DATA:  Staging of non-small cell lung carcinoma EXAM: MRI HEAD WITHOUT AND WITH CONTRAST TECHNIQUE: Multiplanar, multiecho pulse sequences of the brain and surrounding structures were obtained without and with intravenous contrast. CONTRAST:  72mL GADAVIST GADOBUTROL 1 MMOL/ML IV SOLN COMPARISON:  07/06/2012 FINDINGS: Brain: No acute infarct, mass effect or extra-axial collection. No acute or chronic hemorrhage. Hyperintense T2-weighted signal is moderately widespread throughout the white matter. Generalized volume loss without a clear lobar predilection. Old left parietal infarct. The midline structures are normal. There is no abnormal contrast enhancement. Vascular: Major flow voids are preserved. Skull and upper cervical spine: Normal calvarium and skull base. Visualized upper cervical spine and soft tissues are normal. Sinuses/Orbits:No paranasal sinus fluid levels or advanced mucosal thickening. No mastoid or middle ear effusion. Normal orbits. IMPRESSION: 1. No intracranial metastatic disease. 2. 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 Electronically Signed   By: Ulyses Jarred M.D.   On: 03/19/2021 22:06   NM PET Image Initial (PI) Skull Base To Thigh  Result Date: 03/25/2021 CLINICAL DATA:  Initial treatment strategy for lung cancer. EXAM: NUCLEAR MEDICINE PET SKULL BASE TO THIGH TECHNIQUE: 11.3 mCi F-18 FDG was injected intravenously. Full-ring PET imaging was performed from the skull base to thigh after the radiotracer. CT data was obtained and used for attenuation correction and anatomic  localization. Fasting blood glucose: 96 mg/dl COMPARISON:  CT chest 03/21/2021, 02/24/2021. FINDINGS: Mediastinal blood pool activity: SUV max 2.6 Liver activity: SUV max NA NECK: No abnormal hypermetabolism. Incidental CT findings: None. CHEST: Mediastinal lymph nodes measure up to 11 mm in the AP window an SUV max 23. Subcarinal lymph nodes measure up to 1.4 cm with an SUV max 15.0. Left hilar hypermetabolism, SUV max 11.0. Prepericardiac lymph nodes measure up to 11 mm with an SUV max of 8.6. Central left lower lobe mass is difficult to measure without IV contrast but dimensions are roughly 2.8 x 3.8 cm with an SUV max 14.8. Pleural thickening and hypermetabolic pleural/subpleural nodularity in the left hemithorax. Index subpleural lingular nodule measures 11 mm (8/41) with an SUV max of 9.1. Incidental CT findings: Interstitial thickening and nodularity in the left lower lobe, worrisome for lymphangitic carcinomatosis. Partial obstruction of the left lower lobe bronchus. Atherosclerotic calcification of the aorta, aortic valve and coronary arteries. Heart is at the upper limits of normal in size to mildly enlarged. No pericardial effusion. Thin rind of pleural fluid at the base of the left hemithorax with a percutaneous drain in place. ABDOMEN/PELVIS: Hypermetabolic lesions are seen in the dome of the liver, right hepatic lobe and caudate. Caudate mass measures 3.7 x 4.6 cm with an SUV max of 19.0. No abnormal hypermetabolism in the adrenal glands, spleen or pancreas. Hypermetabolic celiac axis lymph node measures 7 mm (4/119) with an SUV max of 7.8. No additional hypermetabolic lymph nodes. Incidental CT findings: Liver, gallbladder, adrenal glands and right kidney are unremarkable. Subcentimeter high and low-attenuation lesions in the left kidney are too small to characterize. Spleen, pancreas, stomach and bowel are unremarkable. Atherosclerotic calcification of the aorta. SKELETON: No  abnormal  hypermetabolism. Incidental CT findings: Degenerative and postoperative changes in the spine. IMPRESSION: 1. Hypermetabolic left lower lobe mass with hypermetabolic intrathoracic and upper abdominal lymph nodes, left pleural/subpleural nodularity and hepatic lesions, consistent with stage IV primary bronchogenic carcinoma. 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 2. Small loculated left pleural effusion with pleural drainage catheter in place. 3.  Aortic atherosclerosis (ICD10-I70.0). Electronically Signed   By: Lorin Picket M.D.   On: 03/25/2021 10:17   DG CHEST PORT 1 VIEW  Result Date: 03/17/2021 CLINICAL DATA:  Status post PleurX catheter placement EXAM: PORTABLE CHEST 1 VIEW COMPARISON:  04/08/2021 FINDINGS: Cardiac shadow is enlarged. Loop recorder is again identified. New PleurX catheter is noted on the left. Small residual effusion remains. No pneumothorax is identified. IMPRESSION: PleurX catheter in satisfactory position. Small residual effusion is noted. No pneumothorax is seen. Electronically Signed   By: Inez Catalina M.D.   On: 03/17/2021 16:00   CUP PACEART REMOTE DEVICE CHECK  Result Date: 03/30/2021 ILR summary report received. Battery status OK. Normal device function. No new symptom, tachy, brady, or pause episodes. No new AF episodes. Monthly summary reports and ROV/PRN   Lab Data:  CBC: Recent Labs  Lab 03/31/21 1020 04/01/21 0542 04/01/21 1422 04/02/21 0540 04/03/21 0459  WBC 4.8 4.3 5.6 4.6 4.5  NEUTROABS 3.6  --   --   --   --   HGB 10.5* 9.6* 9.8* 10.6* 9.5*  HCT 32.0* 30.9* 31.2* 33.6* 30.1*  MCV 90.9 93.1 94.3 94.9 93.8  PLT 311 325 315 341 676   Basic Metabolic Panel: Recent Labs  Lab 03/31/21 1020 04/01/21 0542 04/02/21 0540 04/03/21 0459  NA 135 135 136 134*  K 3.6 4.1 3.8 4.3  CL 105 105 104 103  CO2 20* 21* 24 23  GLUCOSE 120* 126* 89 142*  BUN 19 17 15 20   CREATININE 0.85 0.61 0.84 0.85  CALCIUM 9.2 9.3 9.4 9.1  MG  --    --  1.8  --    GFR: Estimated Creatinine Clearance: 103.3 mL/min (by C-G formula based on SCr of 0.85 mg/dL). Liver Function Tests: Recent Labs  Lab 03/31/21 1020  AST 14*  ALT 12  ALKPHOS 43  BILITOT 0.4  PROT 6.4*  ALBUMIN 2.9*   No results for input(s): LIPASE, AMYLASE in the last 168 hours. No results for input(s): AMMONIA in the last 168 hours. Coagulation Profile: Recent Labs  Lab 04/01/21 0542  INR 1.4*   Cardiac Enzymes: No results for input(s): CKTOTAL, CKMB, CKMBINDEX, TROPONINI in the last 168 hours. BNP (last 3 results) No results for input(s): PROBNP in the last 8760 hours. HbA1C: No results for input(s): HGBA1C in the last 72 hours. CBG: No results for input(s): GLUCAP in the last 168 hours. Lipid Profile: No results for input(s): CHOL, HDL, LDLCALC, TRIG, CHOLHDL, LDLDIRECT in the last 72 hours. Thyroid Function Tests: No results for input(s): TSH, T4TOTAL, FREET4, T3FREE, THYROIDAB in the last 72 hours.  Anemia Panel: No results for input(s): VITAMINB12, FOLATE, FERRITIN, TIBC, IRON, RETICCTPCT in the last 72 hours. Urine analysis:    Component Value Date/Time   COLORURINE AMBER (A) 06/04/2016 1304   APPEARANCEUR CLEAR 06/04/2016 1304   LABSPEC 1.019 06/04/2016 1304   PHURINE 5.5 06/04/2016 1304   GLUCOSEU NEGATIVE 06/04/2016 1304   HGBUR NEGATIVE 06/04/2016 1304   BILIRUBINUR NEGATIVE 06/04/2016 1304   KETONESUR NEGATIVE 06/04/2016 1304   PROTEINUR NEGATIVE 06/04/2016 1304   NITRITE NEGATIVE 06/04/2016 1304  LEUKOCYTESUR SMALL (A) 06/04/2016 1304     Amanat Hackel M.D. [facility] 04/03/2021, 2:05 PM  Available via Epic secure chat 7am-7pm After 7 pm, please refer to night coverage provider listed on amion.

## 2021-04-03 NOTE — TOC Benefit Eligibility Note (Signed)
Patient Teacher, English as a foreign language completed.    The patient is currently admitted and upon discharge could be taking Bevespi.  The current 30 day co-pay is, $50.00.   The patient is currently admitted and upon discharge could be taking Stiolto.  The current 30 day co-pay is, $40.00.   The patient is currently admitted and upon discharge could be taking Anoro.  Requires Prior Authorization  The patient is insured through [place], [place] Patient Advocate Specialist [place] Team Direct Number: [phone]  Fax: [phone]

## 2021-04-04 ENCOUNTER — Other Ambulatory Visit: Payer: Self-pay | Admitting: Allergy

## 2021-04-04 DIAGNOSIS — I480 Paroxysmal atrial fibrillation: Secondary | ICD-10-CM | POA: Diagnosis not present

## 2021-04-04 DIAGNOSIS — I10 Essential (primary) hypertension: Secondary | ICD-10-CM | POA: Diagnosis not present

## 2021-04-04 DIAGNOSIS — J189 Pneumonia, unspecified organism: Secondary | ICD-10-CM | POA: Diagnosis not present

## 2021-04-04 DIAGNOSIS — R042 Hemoptysis: Secondary | ICD-10-CM | POA: Diagnosis not present

## 2021-04-04 LAB — BASIC METABOLIC PANEL
Anion gap: 7 (ref 5–15)
BUN: 22 mg/dL (ref 8–23)
CO2: 24 mmol/L (ref 22–32)
Calcium: 9.1 mg/dL (ref 8.9–10.3)
Chloride: 105 mmol/L (ref 98–111)
Creatinine, Ser: 0.72 mg/dL (ref 0.61–1.24)
GFR, Estimated: >60 mL/min (ref >60)
Glucose, Bld: 119 mg/dL — ABNORMAL HIGH (ref 70–99)
Potassium: 4.5 mmol/L (ref 3.5–5.1)
Sodium: 136 mmol/L (ref 135–145)

## 2021-04-04 MED ORDER — PREDNISONE 20 MG PO TABS: 40.0000 mg | ORAL_TABLET | Freq: Every day | ORAL | Status: DC

## 2021-04-04 MED ORDER — CEFPODOXIME PROXETIL 100 MG PO TABS: 100.0000 mg | ORAL_TABLET | Freq: Two times a day (BID) | ORAL | 0 refills | Status: AC

## 2021-04-04 MED ORDER — DOXYCYCLINE HYCLATE 100 MG PO CAPS: 100.0000 mg | ORAL_CAPSULE | Freq: Two times a day (BID) | ORAL | 0 refills | Status: AC

## 2021-04-04 MED ORDER — STIOLTO RESPIMAT 2.5-2.5 MCG/ACT IN AERS: 2.0000 | INHALATION_SPRAY | Freq: Every day | RESPIRATORY_TRACT | 5 refills | Status: DC

## 2021-04-04 MED ORDER — ALBUTEROL SULFATE HFA 108 (90 BASE) MCG/ACT IN AERS: 2.0000 | INHALATION_SPRAY | RESPIRATORY_TRACT | 0 refills | Status: DC | PRN

## 2021-04-04 MED ORDER — HYDROCOD POLST-CPM POLST ER 10-8 MG/5ML PO SUER: 5.0000 mL | Freq: Two times a day (BID) | ORAL | 0 refills | Status: DC | PRN

## 2021-04-04 MED ORDER — PREDNISONE 20 MG PO TABS: 40.0000 mg | ORAL_TABLET | Freq: Every day | ORAL | 0 refills | Status: AC

## 2021-04-04 MED ORDER — BENZONATATE 200 MG PO CAPS: 200.0000 mg | ORAL_CAPSULE | Freq: Three times a day (TID) | ORAL | 0 refills | Status: DC | PRN

## 2021-04-04 NOTE — Progress Notes (Signed)
Discharge instructions given to patient. Verified medications and pharmacy with patient and wife. Patient discharged in stable conditions.

## 2021-04-04 NOTE — Discharge Summary (Signed)
Physician Discharge Summary   Patient ID: Corey [name] MRN: [id number] DOB/AGE: 01/17/1946 75 y.o.  Admit date: 04/01/2021 Discharge date: 04/04/2021  Primary Care Physician:  [name], MD   Recommendations for Outpatient Follow-up:  Follow up with PCP in 1-2 weeks Started on Stiolto Respimat, 2 puffs daily Continue prednisone 40 mg daily for 5 day Xarelto resumed, follow H&H or any reoccurrence of hemoptysis  Home Health: At baseline  Equipment/Devices:   Discharge Condition: stable  CODE STATUS: FULL  Diet recommendation: Heart healthy diet   Discharge Diagnoses:     Cough with hemoptysis  Non-small cell carcinoma of left lung, stage 4 (HCC)  Benign essential HTN  CAP (community acquired pneumonia)  COPD with acute exacerbation (HCC) Paroxysmal atrial fibrillation GERD Obesity  Consults: Pulmonary critical care    Allergies:   Allergies  Allergen Reactions   Amoxicillin Swelling    Tolerates Rocephin   Fosinopril Other (See Comments)   Hydromorphone Nausea Only, Other (See Comments) and Nausea And Vomiting   Ampicillin Rash   Monopril [Fosinopril Sodium] Other (See Comments)    Muscle aches & pain   Other Other (See Comments)   Rosuvastatin Other (See Comments)    Joint pain     DISCHARGE MEDICATIONS: Allergies as of 04/04/2021       Reactions   Amoxicillin Swelling   Tolerates Rocephin   Fosinopril Other (See Comments)   Hydromorphone Nausea Only, Other (See Comments), Nausea And Vomiting   Ampicillin Rash   Monopril [fosinopril Sodium] Other (See Comments)   Muscle aches & pain   Other Other (See Comments)   Rosuvastatin Other (See Comments)   Joint pain        Medication List     STOP taking these medications    clindamycin 300 MG capsule Commonly known as: CLEOCIN       TAKE these medications    albuterol 108 (90 Base) MCG/ACT inhaler Commonly known as: VENTOLIN HFA Inhale 2 puffs into the lungs every 4 (four) hours  as needed for wheezing or shortness of breath. Inhale 2 puffs into the lungs every 4 hours as needed What changed:  how much to take how to take this when to take this reasons to take this   ALPRAZolam 0.5 MG tablet Commonly known as: XANAX Take 0.5 mg by mouth at bedtime as needed for anxiety.   atorvastatin 40 MG tablet Commonly known as: LIPITOR Take 1 tablet (40 mg total) by mouth daily.   benzonatate 200 MG capsule Commonly known as: TESSALON Take 1 capsule (200 mg total) by mouth 3 (three) times daily as needed for cough.   Carbinoxamine Maleate 6 MG Tabs Take 1 tablet by mouth 2 (two) times daily as needed (drainage).   cefpodoxime 100 MG tablet Commonly known as: VANTIN Take 1 tablet (100 mg total) by mouth 2 (two) times daily for 5 days.   chlorpheniramine-HYDROcodone 10-8 MG/5ML Suer Commonly known as: TUSSIONEX Take 5 mLs by mouth every 12 (twelve) hours as needed for cough.   diclofenac Sodium 1 % Gel Commonly known as: VOLTAREN Apply 4 g topically 4 (four) times daily. What changed:  when to take this reasons to take this   dicyclomine 20 MG tablet Commonly known as: BENTYL Take 20 mg by mouth as needed for spasms.   diltiazem 30 MG tablet Commonly known as: Cardizem Take 1 tablet (30 mg total) by mouth 4 (four) times daily as needed. What changed: reasons to take this  dofetilide 500 MCG capsule Commonly known as: TIKOSYN Take 1 capsule (500 mcg total) by mouth 2 (two) times daily. Please keep upcoming appointment in February for future refills. Thank you   doxycycline 100 MG capsule Commonly known as: Vibramycin Take 1 capsule (100 mg total) by mouth 2 (two) times daily for 5 days.   fenofibrate 160 MG tablet TAKE 1 TABLET DAILY. PLEASE KEEP UPCOMING APPT IN MARCH WITH DR. Acie Fredrickson BEFORE ANYMORE REFILLS.   fluticasone 50 MCG/ACT nasal spray Commonly known as: FLONASE Place 2 sprays into both nostrils daily.   folic acid 1 MG tablet Commonly  known as: FOLVITE Take 1 tablet (1 mg total) by mouth daily.   ipratropium 0.03 % nasal spray Commonly known as: ATROVENT Place 1-2 sprays into both nostrils every 12 (twelve) hours. For drainage   leuprolide (6 Month) 45 MG injection Commonly known as: ELIGARD Inject 45 mg into the skin every 6 (six) months.   loperamide 2 MG capsule Commonly known as: IMODIUM Take 2 mg by mouth daily as needed for diarrhea or loose stools.   magnesium oxide 400 MG tablet Commonly known as: MAG-OX TAKE 1 TABLET BY MOUTH EVERY DAY   metoprolol succinate 25 MG 24 hr tablet Commonly known as: Toprol XL Take 1 tablet (25 mg total) by mouth daily.   multivitamins ther. w/minerals Tabs tablet Take 1 tablet by mouth daily.   nitroGLYCERIN 0.4 MG SL tablet Commonly known as: NITROSTAT Place 1 tablet (0.4 mg total) under the tongue every 5 (five) minutes as needed for chest pain.   omeprazole 20 MG capsule Commonly known as: PRILOSEC Take 1 capsule (20 mg total) by mouth daily.   ondansetron 8 MG tablet Commonly known as: ZOFRAN Take 1 tablet (8 mg total) by mouth every 8 (eight) hours as needed for nausea or vomiting. Starting 3 days after chemotherapy   potassium chloride 10 MEQ tablet Commonly known as: KLOR-CON Take 1 tablet (10 mEq total) by mouth daily.   predniSONE 20 MG tablet Commonly known as: DELTASONE Take 2 tablets (40 mg total) by mouth daily with breakfast for 5 days. Start taking on: April 05, 2021   Probiotic 250 MG Caps Take 1 capsule by mouth daily.   promethazine 25 MG tablet Commonly known as: PHENERGAN Take 1 tablet (25 mg total) by mouth every 6 (six) hours as needed for nausea or vomiting.   Stiolto Respimat 2.5-2.5 MCG/ACT Aers Generic drug: Tiotropium Bromide-Olodaterol Inhale 2 puffs into the lungs daily.   sucralfate 1 GM/10ML suspension Commonly known as: Carafate Take 10 mLs (1 g total) by mouth 4 (four) times daily. Before meals   tamsulosin 0.4 MG  Caps capsule Commonly known as: FLOMAX Take 0.4 mg by mouth daily.   topiramate 50 MG tablet Commonly known as: TOPAMAX Take 2 tablets (100 mg total) by mouth 2 (two) times daily. What changed:  how much to take when to take this additional instructions   traMADol 50 MG tablet Commonly known as: ULTRAM Take 1 tablet (50 mg total) by mouth every 6 (six) hours as needed for moderate pain.   traZODone 50 MG tablet Commonly known as: DESYREL Take 0.5 tablets (25 mg total) by mouth at bedtime as needed for sleep.   Tylenol 8 Hour Arthritis Pain 650 MG CR tablet Generic drug: acetaminophen Take 1,300 mg by mouth every 8 (eight) hours as needed for pain (pain).   Vitamin D (Ergocalciferol) 1.25 MG (50000 UNIT) Caps capsule Commonly known as: DRISDOL TAKE  1 CAPSULE BY MOUTH ONE TIME PER WEEK What changed: See the new instructions.   Xarelto 20 MG Tabs tablet Generic drug: rivaroxaban TAKE 1 TABLET BY MOUTH EVERY DAY What changed:  how much to take when to take this         Brief H and P: For complete details please refer to admission H and P, but in brief Patient is a 75 year old male with A. 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  Patient presented for evaluation due to hemoptysis which started in the early hours of the morning of admission.  Patient had 3 episodes of hemoptysis with bright red blood about 1 tablespoon each episode.  Denied any fevers or chills or any recent sick contacts.  Patient has cough occasionally productive of clear phlegm, wheezing. Per patient he takes albuterol inhaler at home however he was recently taken off Flovent.  Hospital Course:    Cough with hemoptysis in the setting of stage IV NSCLC left lung, Pleurx catheter, on anticoagulation, COPD exacerbation, CAP pneumonia  just -Patient was also noted to be wheezing at the time of admission -Patient was started on IV steroids, significant improvement in the wheezing, transition to oral prednisone to complete 5 days -Patient was placed on duo nebs, IV Rocephin and doxycycline.  Transitioned to oral doxycycline and cefpodoxime for 5 days -As hemoptysis resolved, no further need of bronchoscopy -Pulmonology following, recommended discharge on LAMA/LABA combination inhaler, started on Stiolto Respimat upon DC        Benign essential HTN -Currently stable, continue metoprolol   Paroxysmal AF (paroxysmal atrial fibrillation) ([place]) with acquired thrombophilia, prolonged QTC - Xarelto was initially held due to hemoptysis. CHADS2VASC 5. -Xarelto resumed, H&H remained stable, no recurrence of hemoptysis -Continue Tikosyn, metoprolol, heart rate controlled      Non-small cell carcinoma of left lung, stage 4 (HCC) -Patient has completed XRT and received his first chemotherapy on 03/31/2021 -Oncology notified, d/w Dr [name], follow closely for any side effects -Currently stable, no acute issues, continue antiemetics as needed   GERD -Continue PPI, Carafate   Obesity Estimated body mass index is 31.93 kg/m as calculated from the following:   Height as of this encounter: 6\' 2"  (1.88 m).   Weight as of this encounter: 112.8 kg.  Day of Discharge S: No acute complaints except coughing.  Wheezing improved.  Looking forward to go home today  BP 134/89   Pulse 75   Temp 98.2 F (36.8 C) (Oral)   Resp 20   Ht 6\' 2"  (1.88 m)   Wt 112.8 kg   SpO2 95%   BMI 31.93 kg/m   Physical Exam: General: Alert and awake oriented x3 not in any acute distress. CVS: S1-S2 clear no murmur rubs or gallops Chest: clear to auscultation bilaterally, no wheezing rales or rhonchi Abdomen: soft nontender, nondistended, normal bowel sounds Extremities: no cyanosis, clubbing or edema noted bilaterally Neuro: no new  deficits    Get Medicines reviewed and adjusted: Please take all your medications with you for your next visit with your Primary MD  Please request your Primary MD to go over all hospital tests and procedure/radiological results at the follow up. Please ask your Primary MD to get all Hospital records sent to his/her office.  If you experience worsening of your admission symptoms, develop shortness of breath, life  threatening emergency, suicidal or homicidal thoughts you must seek medical attention immediately by calling 911 or calling your MD immediately  if symptoms less severe.  You must read complete instructions/literature along with all the possible adverse reactions/side effects for all the Medicines you take and that have been prescribed to you. Take any new Medicines after you have completely understood and accept all the possible adverse reactions/side effects.   Do not drive when taking pain medications.   Do not take more than prescribed Pain, Sleep and Anxiety Medications  Special Instructions: If you have smoked or chewed Tobacco  in the last 2 yrs please stop smoking, stop any regular Alcohol  and or any Recreational drug use.  Wear Seat belts while driving.  Please note  You were cared for by a hospitalist during your hospital stay. Once you are discharged, your primary care physician will handle any further medical issues. Please note that NO REFILLS for any discharge medications will be authorized once you are discharged, as it is imperative that you return to your primary care physician (or establish a relationship with a primary care physician if you do not have one) for your aftercare needs so that they can reassess your need for medications and monitor your lab values.   The results of significant diagnostics from this hospitalization (including imaging, microbiology, ancillary and laboratory) are listed below for reference.      Procedures/Studies:  DG Chest 1  View  Result Date: 03/20/2021 CLINICAL DATA:  History of stage IV lung cancer. EXAM: CHEST  1 VIEW COMPARISON:  Radiograph 03/17/2021.  CT 02/24/2021 FINDINGS: Patient's left-sided PleurX catheter is not well-defined on the current exam, faintly visualized in the mid lower thorax. Left pleural effusion and hazy opacity at the left lung base, similar to prior exam allowing for differences in technique. Chronic left lung volume loss. Implanted loop recorder projects over the left chest wall. Stable heart size and mediastinal contours with aortic atherosclerosis the right lung is clear. No pneumothorax. IMPRESSION: Left-sided PleurX catheter is not well-defined on the current exam, faintly visualized in the mid lower thorax. Similar appearance of left pleural effusion and basilar opacity to prior exam allowing for differences in technique. No pneumothorax. Electronically Signed   By: Keith Rake M.D.   On: 03/20/2021 21:14   DG Chest 2 View  Result Date: 03/09/2021 CLINICAL DATA:  pleural effusion EXAM: CHEST - 2 VIEW COMPARISON:  02/25/2021 FINDINGS: Unchanged cardiomediastinal silhouette. Cardiac device overlies the left mid chest. Decreased size of the left pleural effusion. There is slight improved aeration in the left lung base. Of there is no visible pneumothorax. Bones are unchanged. IMPRESSION: Decreased size of the left pleural effusion with slight improved aeration in the left lung base. Electronically Signed   By: Maurine Simmering   On: 03/09/2021 09:45   CT Chest Wo Contrast  Result Date: 03/21/2021 CLINICAL DATA:  Chest pain and shortness of breath EXAM: CT CHEST WITHOUT CONTRAST TECHNIQUE: Multidetector CT imaging of the chest was performed following the standard protocol without IV contrast. COMPARISON:  None. FINDINGS: Cardiovascular: Calcific aortic atherosclerosis and coronary artery atherosclerosis. Trace pericardial fluid. Heart size is normal. Mediastinum/Nodes: 13 mm preaortic lymph node.  15 mm subcarinal lymph node. No axillary adenopathy. Esophagus is unremarkable. Lungs/Pleura: Filling defects in the left lower lobe bronchial tree with associated atelectasis. Right lung is clear. Small left pleural effusion. There is a tunneled pleural catheter at the left lung base. Unchanged nodular foci in the lingula.  Upper Abdomen: No acute abnormality. Musculoskeletal: No chest wall mass or suspicious bone lesions identified. IMPRESSION: 1. Filling defects in the left lower lobe bronchial tree with associated atelectasis, likely mucous plugging. 2. Small left pleural effusion with tunneled pleural catheter at the left lung base. 3. Mildly enlarged mediastinal lymph nodes, likely reactive. Aortic Atherosclerosis (ICD10-I70.0). Electronically Signed   By: Ulyses Jarred M.D.   On: 03/21/2021 01:05   CT Angio Chest Pulmonary Embolism (PE) W or WO Contrast  Result Date: 04/01/2021 CLINICAL DATA:  75 year old male with hemoptysis, recent diagnosis of lung cancer. Pulmonary embolism suspected. EXAM: CT ANGIOGRAPHY CHEST WITH CONTRAST TECHNIQUE: Multidetector CT imaging of the chest was performed using the standard protocol during bolus administration of intravenous contrast. Multiplanar CT image reconstructions and MIPs were obtained to evaluate the vascular anatomy. CONTRAST:  100 mL Omnipaque 350, intravenous COMPARISON:  03/21/2021, 03/24/2021 FINDINGS: Cardiovascular: Satisfactory opacification of the pulmonary arteries to the segmental level. No evidence of pulmonary embolism. Normal heart size. Severe coronary atherosclerotic calcifications. Scattered thoracic aortic atherosclerotic calcifications. No pericardial effusion. Mediastinum/Nodes: Multiple prominent aortopulmonic window lymph nodes, the largest measuring up to 14 mm in short axis, similar to comparison. The thyroid gland is within normal limits. The esophagus is mildly patulous throughout. Trachea is within normal limits. No axillary or  supraclavicular lymphadenopathy. Lungs/Pleura: Similar appearing central left lower lobe opacifications with associated architectural distortion, difficult to measure giving extent from the left lung base to the hilum, overall similar to recent comparisons. Similar appearing left basilar pleural thickening and multifocal subpleural left upper and lower lobe nodular opacities, the largest measuring up to 12 mm in the lingula. Interval development of element of diffuse ground-glass and reticular opacifications in the left lower lobe. No pneumothorax. The right lung is clear. Upper Abdomen: Similar appearing hypodense, poorly defined mass in the caudate lobe of the liver measuring up to approximately 3.6 cm. The remaining visualized upper abdomen is within normal limits. Musculoskeletal: Partially visualized anterior cervical fusion hardware in place without complicating features. Multilevel degenerative changes of the thoracic spine. No acute osseous abnormality or aggressive appearing osseous lesion. Review of the MIP images confirms the above findings. IMPRESSION: Vascular: 1. No pulmonary embolism. 2. Coronary and aortic atherosclerosis (ICD10-I70.0). Non-Vascular: 1. Interval development of ground-glass and reticular opacities in the left lower lobe as could be seen with progressive lymphangitic carcinomatosis secondary to known advanced stage left lower lobe lung mass. Superimposed pneumonia could appear similarly. 2. Overall similar appearance of poorly defined, extensive central left lower lobe mass and associated left basilar pleural thickening and scattered left upper and lower lobe pleural nodularity. 3. Similar appearing hypoattenuating caudate lobe mass and left mediastinal lymphadenopathy compatible with known metastatic disease. Ruthann Cancer, MD [facility] Electronically Signed   By: Ruthann Cancer MD   On: 04/01/2021 07:30   MR BRAIN W WO  CONTRAST  Result Date: 03/19/2021 CLINICAL DATA:  Staging of non-small cell lung carcinoma EXAM: MRI HEAD WITHOUT AND WITH CONTRAST TECHNIQUE: Multiplanar, multiecho pulse sequences of the brain and surrounding structures were obtained without and with intravenous contrast. CONTRAST:  68mL GADAVIST GADOBUTROL 1 MMOL/ML IV SOLN COMPARISON:  07/06/2012 FINDINGS: Brain: No acute infarct, mass effect or extra-axial collection. No acute or chronic hemorrhage. Hyperintense T2-weighted signal is moderately widespread throughout the white matter. Generalized volume loss without a clear lobar predilection. Old left parietal infarct. The midline structures are normal. There is no abnormal contrast enhancement. Vascular: Major flow voids are  preserved. Skull and upper cervical spine: Normal calvarium and skull base. Visualized upper cervical spine and soft tissues are normal. Sinuses/Orbits:No paranasal sinus fluid levels or advanced mucosal thickening. No mastoid or middle ear effusion. Normal orbits. IMPRESSION: 1. No intracranial metastatic disease. 2. 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 Electronically Signed   By: Ulyses Jarred M.D.   On: 03/19/2021 22:06   NM PET Image Initial (PI) Skull Base To Thigh  Result Date: 03/25/2021 CLINICAL DATA:  Initial treatment strategy for lung cancer. EXAM: NUCLEAR MEDICINE PET SKULL BASE TO THIGH TECHNIQUE: 11.3 mCi F-18 FDG was injected intravenously. Full-ring PET imaging was performed from the skull base to thigh after the radiotracer. CT data was obtained and used for attenuation correction and anatomic localization. Fasting blood glucose: 96 mg/dl COMPARISON:  CT chest 03/21/2021, 02/24/2021. FINDINGS: Mediastinal blood pool activity: SUV max 2.6 Liver activity: SUV max NA NECK: No abnormal hypermetabolism. Incidental CT findings: None. CHEST: Mediastinal lymph nodes measure up to 11 mm in the AP window an SUV max 23. Subcarinal lymph nodes  measure up to 1.4 cm with an SUV max 15.0. Left hilar hypermetabolism, SUV max 11.0. Prepericardiac lymph nodes measure up to 11 mm with an SUV max of 8.6. Central left lower lobe mass is difficult to measure without IV contrast but dimensions are roughly 2.8 x 3.8 cm with an SUV max 14.8. Pleural thickening and hypermetabolic pleural/subpleural nodularity in the left hemithorax. Index subpleural lingular nodule measures 11 mm (8/41) with an SUV max of 9.1. Incidental CT findings: Interstitial thickening and nodularity in the left lower lobe, worrisome for lymphangitic carcinomatosis. Partial obstruction of the left lower lobe bronchus. Atherosclerotic calcification of the aorta, aortic valve and coronary arteries. Heart is at the upper limits of normal in size to mildly enlarged. No pericardial effusion. Thin rind of pleural fluid at the base of the left hemithorax with a percutaneous drain in place. ABDOMEN/PELVIS: Hypermetabolic lesions are seen in the dome of the liver, right hepatic lobe and caudate. Caudate mass measures 3.7 x 4.6 cm with an SUV max of 19.0. No abnormal hypermetabolism in the adrenal glands, spleen or pancreas. Hypermetabolic celiac axis lymph node measures 7 mm (4/119) with an SUV max of 7.8. No additional hypermetabolic lymph nodes. Incidental CT findings: Liver, gallbladder, adrenal glands and right kidney are unremarkable. Subcentimeter high and low-attenuation lesions in the left kidney are too small to characterize. Spleen, pancreas, stomach and bowel are unremarkable. Atherosclerotic calcification of the aorta. SKELETON: No abnormal hypermetabolism. Incidental CT findings: Degenerative and postoperative changes in the spine. IMPRESSION: 1. Hypermetabolic left lower lobe mass with hypermetabolic intrathoracic and upper abdominal lymph nodes, left pleural/subpleural nodularity and hepatic lesions, consistent with stage IV primary bronchogenic carcinoma. 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 2. Small loculated left pleural effusion with pleural drainage catheter in place. 3.  Aortic atherosclerosis (ICD10-I70.0). Electronically Signed   By: Lorin Picket M.D.   On: 03/25/2021 10:17   DG CHEST PORT 1 VIEW  Result Date: 03/17/2021 CLINICAL DATA:  Status post PleurX catheter placement EXAM: PORTABLE CHEST 1 VIEW COMPARISON:  04/08/2021 FINDINGS: Cardiac shadow is enlarged. Loop recorder is again identified. New PleurX catheter is noted on the left. Small residual effusion remains. No pneumothorax is identified. IMPRESSION: PleurX catheter in satisfactory position. Small residual effusion is noted. No pneumothorax is seen. Electronically Signed   By: Inez Catalina M.D.   On: 03/17/2021 16:00   CUP PACEART REMOTE  DEVICE CHECK  Result Date: 03/30/2021 ILR summary report received. Battery status OK. Normal device function. No new symptom, tachy, brady, or pause episodes. No new AF episodes. Monthly summary reports and ROV/PRN     LAB RESULTS: Basic Metabolic Panel: Recent Labs  Lab 04/02/21 0540 04/03/21 0459 04/04/21 0622  NA 136 134* 136  K 3.8 4.3 4.5  CL 104 103 105  CO2 24 23 24   GLUCOSE 89 142* 119*  BUN 15 20 22   CREATININE 0.84 0.85 0.72  CALCIUM 9.4 9.1 9.1  MG 1.8  --   --    Liver Function Tests: Recent Labs  Lab 03/31/21 1020  AST 14*  ALT 12  ALKPHOS 43  BILITOT 0.4  PROT 6.4*  ALBUMIN 2.9*   No results for input(s): LIPASE, AMYLASE in the last 168 hours. No results for input(s): AMMONIA in the last 168 hours. CBC: Recent Labs  Lab 03/31/21 1020 04/01/21 0542 04/02/21 0540 04/03/21 0459  WBC 4.8   < > 4.6 4.5  NEUTROABS 3.6  --   --   --   HGB 10.5*   < > 10.6* 9.5*  HCT 32.0*   < > 33.6* 30.1*  MCV 90.9   < > 94.9 93.8  PLT 311   < > 341 284   < > = values in this interval not displayed.   Cardiac Enzymes: No results for input(s): CKTOTAL, CKMB, CKMBINDEX, TROPONINI in the last 168  hours. BNP: Invalid input(s): POCBNP CBG: No results for input(s): GLUCAP in the last 168 hours.     Disposition and Follow-up: Discharge Instructions     Diet - low sodium heart healthy   Complete by: As directed    Increase activity slowly   Complete by: As directed         DISPOSITION: Atkins, MD. Schedule an appointment as soon as possible for a visit in 2 week(s).   Specialty: Family Medicine Why: for hospital follow-up Contact information: [address] [address] [phone]         Nahser, Wonda Cheng, MD .   Specialty: Cardiology Contact information: [address] [place] [address] [phone]                  Time coordinating discharge:  35 minutes  Signed:   Estill Cotta M.D. [facility] 04/04/2021, 11:42 AM

## 2021-04-05 DIAGNOSIS — G4733 Obstructive sleep apnea (adult) (pediatric): Secondary | ICD-10-CM | POA: Diagnosis not present

## 2021-04-06 LAB — CULTURE, BLOOD (ROUTINE X 2)
Culture: NO GROWTH
Culture: NO GROWTH

## 2021-04-06 NOTE — Progress Notes (Signed)
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     Molecular Studies by [employer]: [id number] 0.5%   Olaparib Yes [id number] 50.9%   Olaparib Yes [id number] 0.8% None      PDL1: 30%  PRIOR THERAPY: 1) Palliative radiotherapy to the obstructive lung mass in the left lung under the care of Dr. MTammi Klippel Last treatment 03/23/21  CURRENT THERAPY: Palliative systemic chemotherapy with carboplatin AUC 5, Alimta 500 mg per metered squared, Keytruda 200 mg IV every 3 weeks.  First dose expected on 03/31/21. Status post 1 cycle.   INTERVAL HISTORY: Corey [id number].[id number]. male returns to the clinic today for a follow-up visit accompanied by his wife.  Last week on 03/31/2021, the patient underwent his first cycle of chemotherapy for his stage IV lung cancer.  Around 3 in the morning, the patient experienced 3 episodes of hemoptysis obtaining approximately 1 tablespoon of blood.  The patient then presented to the emergency room on 04/01/2021 and was discharged on 04/04/2021 for the chief complaint of hemoptysis.  The patient was treated with IV steroids, duo nebs, IV Rocephin, and doxycycline.  He also had his inhalers adjusted.  The patient's hemoptysis has resolved at this time.  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  Otherwise, since being discharged the patient is continuing to report  fatigue. He takes naps to help with his stamina. He has 2 more days of prednisone and doxycycline. The prednisone makes him feel jittery. Otherwise, the patient tolerated his first cycle of treatment well. He reports he drinks plenty of water prior to his appointments but they have a challenging time obtaining blood. He is interested in a port-a-cath. He reports nausea secondary to taking his morning mediations. He started to take his anti-emetic prior to taking his medications. He denies any vomiting or diarrhea. He has some baseline constipation for which he takes miralax if needed. He denies any fever, chills, or weight loss.  He occasionally experiences some night sweats around the collar of his shirt. The patient has a Pleurx catheter which is obtaining approximate 50 milliliters of fluid . He saw Dr. STamala Julianfrom pulmonology in the hospital who was going to discuss with Dr. IValeta Harmsabout removing this soon, per patient report. Otherwise, his next appointment with Dr. IValeta Harmsis in August. He believes his cough is better and he is compliant with his inhaler 1x per day. He uses his albuterol inhaler PRN if he feels like he is developing wheezing. He denies any headache or visual changes except he has some baseline visual changes which he thinks is secondary to cataracts. He was supposed to see his eye doctor but had to cancel it due to his recent diagnosis and he has not rescheduled it at this time.  He denies any rashes or skin changes.  The patient is here today for repeat blood work, evaluation, and a 1 week follow-up visit to manage any adverse side effects of treatment.   MEDICAL HISTORY: Past Medical  History:  Diagnosis Date   Anemia    Anxiety    Arthritis    "back, right knee, hands, ankles, neck" (05/31/2016)   Carotid artery disease ([place]) 08/30/2016   Carotid US 11/19: R 1-39, L 40-59 // Carotid US 08/2019: R 1-39; L 40-59 // Carotid US 11/21: R 1-39; L 40-59>> repeat 1 year    Chronic lower back  pain    Coronary atherosclerosis of native coronary artery    a. BMS to [facility] 2004 and 2007, otherwise mild nonobstructive disease. EF normal.   Diverticulitis    Dyslipidemia    Dyspnea    Essential hypertension, benign    Family history of breast cancer    Family history of lung cancer    Family history of prostate cancer    GERD (gastroesophageal reflux disease)    Lumbar radiculopathy, chronic 02/04/2015   Right L5   Obesity    OSA on CPAP    uses cpap   Paroxysmal atrial fibrillation ([place])    a. Discovered after stroke.   Pneumonia 01/1996   PONV (postoperative nausea and vomiting)    Prostate CA ([place])    Recurrent upper respiratory infection (URI)    Stroke ([place]) 07/2012   no deficits   Visit for monitoring Tikosyn therapy 09/18/2019    ALLERGIES:  is allergic to amoxicillin, fosinopril, hydromorphone, ampicillin, monopril [fosinopril sodium], other, and rosuvastatin.  MEDICATIONS:  Current Outpatient Medications  Medication Sig Dispense Refill   lidocaine-prilocaine (EMLA) cream Apply 1 application topically as needed. 30 g 2   acetaminophen (TYLENOL 8 HOUR ARTHRITIS PAIN) 650 MG CR tablet Take 1,300 mg by mouth every 8 (eight) hours as needed for pain (pain).     albuterol (VENTOLIN HFA) 108 (90 Base) MCG/ACT inhaler Inhale 2 puffs into the lungs every 4 (four) hours as needed for wheezing or shortness of breath. Inhale 2 puffs into the lungs every 4 hours as needed 18 g 0   ALPRAZolam (XANAX) 0.5 MG tablet Take 0.5 mg by mouth at bedtime as needed for anxiety.     atorvastatin (LIPITOR) 40 MG tablet Take 1 tablet (40 mg total) by mouth daily. 90 tablet 3   benzonatate (TESSALON) 200 MG capsule Take 1 capsule (200 mg total) by mouth 3 (three) times daily as needed for cough. 60 capsule 0   Carbinoxamine Maleate 6 MG TABS Take 1 tablet by mouth 2 (two) times daily as needed (drainage). 60 tablet 4   cefpodoxime (VANTIN) 100 MG tablet Take 1 tablet (100 mg total) by mouth  2 (two) times daily for 5 days. 10 tablet 0   chlorpheniramine-HYDROcodone (TUSSIONEX) 10-8 MG/5ML SUER Take 5 mLs by mouth every 12 (twelve) hours as needed for cough. 115 mL 0   diclofenac Sodium (VOLTAREN) 1 % GEL Apply 4 g topically 4 (four) times daily. 100 g 0   dicyclomine (BENTYL) 20 MG tablet Take 20 mg by mouth as needed for spasms.     diltiazem (CARDIZEM) 30 MG tablet Take 1 tablet (30 mg total) by mouth 4 (four) times daily as needed. 60 tablet 6   dofetilide (TIKOSYN) 500 MCG capsule Take 1 capsule (500 mcg total) by mouth 2 (two) times daily. Please keep upcoming appointment in February for future refills. Thank you 180 capsule 3   doxycycline (VIBRAMYCIN) 100 MG capsule Take 1 capsule (100 mg total) by mouth 2 (two) times daily for 5 days. 10 capsule 0   fenofibrate 160 MG tablet TAKE 1 TABLET DAILY.  PLEASE KEEP UPCOMING APPT IN MARCH WITH DR. Acie Fredrickson BEFORE ANYMORE REFILLS. 90 tablet 3   fluticasone (FLONASE) 50 MCG/ACT nasal spray Place 2 sprays into both nostrils daily. 65.6 mL 5   folic acid (FOLVITE) 1 MG tablet Take 1 tablet (1 mg total) by mouth daily. 30 tablet 2   ipratropium (ATROVENT) 0.03 % nasal spray Place 1-2 sprays into both nostrils every 12 (twelve) hours. For drainage 30 mL 5   leuprolide, 6 Month, (ELIGARD) 45 MG injection Inject 45 mg into the skin every 6 (six) months.     loperamide (IMODIUM) 2 MG capsule Take 2 mg by mouth daily as needed for diarrhea or loose stools.     magnesium oxide (MAG-OX) 400 MG tablet TAKE 1 TABLET BY MOUTH EVERY DAY 90 tablet 2   metoprolol succinate (TOPROL XL) 25 MG 24 hr tablet Take 1 tablet (25 mg total) by mouth daily. 90 tablet 3   Multiple Vitamins-Minerals (MULTIVITAMINS THER. W/MINERALS) TABS Take 1 tablet by mouth daily.     nitroGLYCERIN (NITROSTAT) 0.4 MG SL tablet Place 1 tablet (0.4 mg total) under the tongue every 5 (five) minutes as needed for chest pain. 25 tablet 5   omeprazole (PRILOSEC) 20 MG capsule Take 1 capsule  (20 mg total) by mouth daily. 30 capsule 5   ondansetron (ZOFRAN) 8 MG tablet Take 1 tablet (8 mg total) by mouth every 8 (eight) hours as needed for nausea or vomiting. Starting 3 days after chemotherapy 30 tablet 2   potassium chloride (KLOR-CON) 10 MEQ tablet Take 1 tablet (10 mEq total) by mouth daily. 90 tablet 2   predniSONE (DELTASONE) 20 MG tablet Take 2 tablets (40 mg total) by mouth daily with breakfast for 5 days. 10 tablet 0   promethazine (PHENERGAN) 25 MG tablet Take 1 tablet (25 mg total) by mouth every 6 (six) hours as needed for nausea or vomiting. 30 tablet 1   Saccharomyces boulardii (PROBIOTIC) 250 MG CAPS Take 1 capsule by mouth daily.     sucralfate (CARAFATE) 1 GM/10ML suspension Take 10 mLs (1 g total) by mouth 4 (four) times daily. Before meals 420 mL 1   tamsulosin (FLOMAX) 0.4 MG CAPS capsule Take 0.4 mg by mouth daily.     Tiotropium Bromide-Olodaterol (STIOLTO RESPIMAT) 2.5-2.5 MCG/ACT AERS Inhale 2 puffs into the lungs daily. 4 g 5   topiramate (TOPAMAX) 50 MG tablet Take 2 tablets (100 mg total) by mouth 2 (two) times daily. 360 tablet 3   traMADol (ULTRAM) 50 MG tablet Take 1 tablet (50 mg total) by mouth every 6 (six) hours as needed for moderate pain. 30 tablet 0   traZODone (DESYREL) 50 MG tablet Take 0.5 tablets (25 mg total) by mouth at bedtime as needed for sleep. 45 tablet 3   Vitamin D, Ergocalciferol, (DRISDOL) 1.25 MG (50000 UNIT) CAPS capsule TAKE 1 CAPSULE BY MOUTH ONE TIME PER WEEK 12 capsule 1   XARELTO 20 MG TABS tablet TAKE 1 TABLET BY MOUTH EVERY DAY 90 tablet 2   No current facility-administered medications for this visit.    SURGICAL HISTORY:  Past Surgical History:  Procedure Laterality Date   ADENOIDECTOMY     ANTERIOR CERVICAL DECOMP/DISCECTOMY FUSION  07/2001; 10/2002   "C5-6; C6-7; redo"   BACK SURGERY     CARPAL TUNNEL RELEASE Left 10/2015   CHEST TUBE INSERTION Left 03/17/2021   Procedure: INSERTION PLEURAL DRAINAGE CATHETER;   Surgeon: Garner Nash, DO;  Location: [facility];  Service:  Pulmonary;  Laterality: Left;  Indwelling Tunneled Pleural catheter (PLEUREX)    COLONOSCOPY W/ POLYPECTOMY  02/2014   CORONARY ANGIOPLASTY WITH STENT PLACEMENT  05/2003; 12/2005   "mid RCA; mid RCA"   FINE NEEDLE ASPIRATION  02/26/2021   Procedure: FINE NEEDLE ASPIRATION (FNA) LINEAR;  Surgeon: Candee Furbish, MD;  Location: [facility] ENDOSCOPY;  Service: Pulmonary;;   HAND SURGERY  02/2019   LEFT HAND   implantable loop recorder placement  02/27/2020   Med[employer]onic Reveal [place] model [id number]  implantable loop recorder implanted by Dr Rayann Heman for afib management and evaluation of presyncope   JOINT REPLACEMENT     KNEE ARTHROSCOPY Left 10/2005   KNEE ARTHROSCOPY W/ PARTIAL MEDIAL MENISCECTOMY Left 09/2005   LUMBAR LAMINECTOMY/DECOMPRESSION MICRODISCECTOMY  03/2005   "L4-5"   POSTERIOR LUMBAR FUSION  10/2003   L5-S1; "plates, screws"   SHOULDER ARTHROSCOPY Right 08/2011   Debridement of labrum, arthroscopic distal clavicle excision   SHOULDER OPEN ROTATOR CUFF REPAIR Left 07/2014   TEE WITHOUT CARDIOVERSION  07/07/2012   Procedure: TRANSESOPHAGEAL ECHOCARDIOGRAM (TEE);  Surgeon: Fay Records, MD;  Location: [facility] ENDOSCOPY;  Service: Cardiovascular;  Laterality: N/A;   THORACENTESIS N/A 02/25/2021   Procedure: Mathews Robinsons;  Surgeon: Juanito Doom, MD;  Location: [facility];  Service: Cardiopulmonary;  Laterality: N/A;   TONSILLECTOMY AND ADENOIDECTOMY  ~ 1956   TOTAL KNEE ARTHROPLASTY Left 10/2006   TRIGGER FINGER RELEASE Left 10/2015   VIDEO BRONCHOSCOPY WITH ENDOBRONCHIAL ULTRASOUND Left 02/26/2021   Procedure: VIDEO BRONCHOSCOPY WITH ENDOBRONCHIAL ULTRASOUND;  Surgeon: Candee Furbish, MD;  Location: [facility] ENDOSCOPY;  Service: Pulmonary;  Laterality: Left;  cryoprobe too thanks!    REVIEW OF SYSTEMS:   Review of Systems Constitutional: Positive for chronic fatigue. Negative for appetite change, chills, fever and unexpected  weight change. HENT: Negative for mouth sores, nosebleeds, sore throat and trouble swallowing.   Eyes: Negative for eye problems and icterus.  Respiratory: Positive for shortness of breath and cough. Negative for hemoptysis (resolved) and wheezing.   Cardiovascular: Negative for chest pain and leg swelling. Gastrointestinal: Positive for intermittent baseline constipation. Negative for abdominal pain, diarrhea, nausea and vomiting. Genitourinary: Negative for bladder incontinence, difficulty urinating, dysuria, frequency and hematuria.   Musculoskeletal: Negative for back pain, gait problem, neck pain and neck stiffness. Skin: Negative for itching and rash. Neurological: Negative for dizziness, extremity weakness, gait problem, headaches, light-headedness and seizures. Hematological: Negative for adenopathy. Does not bruise/bleed easily. Psychiatric/Behavioral: Negative for confusion, depression and sleep disturbance. The patient is not nervous/anxious.     PHYSICAL EXAMINATION:  Blood pressure 114/66, pulse 77, temperature 97.6 F (36.4 C), temperature source Tympanic, resp. rate 19, height 6' 2"  (1.88 m), weight 244 lb 12.8 oz (111 kg), SpO2 100 %.  ECOG PERFORMANCE STATUS: 2  Physical Exam  Constitutional: Oriented to person, place, and time and well-developed, well-nourished, and in no distress. HENT: Head: Normocephalic and atraumatic. Mouth/Throat: Oropharynx is clear and moist. No oropharyngeal exudate. Eyes: Conjunctivae are normal. Right eye exhibits no discharge. Left eye exhibits no discharge. No scleral icterus. Neck: Normal range of motion. Neck supple. Cardiovascular: Normal rate, regular rhythm, normal heart sounds and intact distal pulses.   Pulmonary/Chest: Effort normal. No respiratory distress. No wheezes. Some mild crackles in left lower lobe.  Abdominal: Soft. Bowel sounds are normal. Exhibits no distension and no mass. There is no tenderness.  Musculoskeletal:  Normal range of motion. Exhibits no edema.  Lymphadenopathy:    No cervical adenopathy.  Neurological:  Alert and oriented to person, place, and time. Exhibits normal muscle tone. Examined in the wheelchair.  Skin: Skin is warm and dry. No rash noted. Not diaphoretic. No erythema. No pallor.  Psychiatric: Mood, memory and judgment normal. Vitals reviewed.  LABORATORY DATA: Lab Results  Component Value Date   WBC 1.9 (L) 04/07/2021   HGB 9.6 (L) 04/07/2021   HCT 29.7 (L) 04/07/2021   MCV 91.7 04/07/2021   PLT 175 04/07/2021      Chemistry      Component Value Date/Time   NA 136 04/07/2021 1040   NA 137 01/21/2021 1553   K 4.3 04/07/2021 1040   CL 102 04/07/2021 1040   CO2 26 04/07/2021 1040   BUN 20 04/07/2021 1040   BUN 17 01/21/2021 1553   CREATININE 0.80 04/07/2021 1040   CREATININE 1.04 08/30/2016 0840      Component Value Date/Time   CALCIUM 8.8 (L) 04/07/2021 1040   ALKPHOS 52 04/07/2021 1040   AST 13 (L) 04/07/2021 1040   ALT 17 04/07/2021 1040   BILITOT 0.5 04/07/2021 1040       RADIOGRAPHIC STUDIES:  DG Chest 1 View  Result Date: 03/20/2021 CLINICAL DATA:  History of stage IV lung cancer. EXAM: CHEST  1 VIEW COMPARISON:  Radiograph 03/17/2021.  CT 02/24/2021 FINDINGS: Patient's left-sided PleurX catheter is not well-defined on the current exam, faintly visualized in the mid lower thorax. Left pleural effusion and hazy opacity at the left lung base, similar to prior exam allowing for differences in technique. Chronic left lung volume loss. Implanted loop recorder projects over the left chest wall. Stable heart size and mediastinal contours with aortic atherosclerosis the right lung is clear. No pneumothorax. IMPRESSION: Left-sided PleurX catheter is not well-defined on the current exam, faintly visualized in the mid lower thorax. Similar appearance of left pleural effusion and basilar opacity to prior exam allowing for differences in technique. No pneumothorax.  Electronically Signed   By: Keith Rake M.D.   On: 03/20/2021 21:14   DG Chest 2 View  Result Date: 03/09/2021 CLINICAL DATA:  pleural effusion EXAM: CHEST - 2 VIEW COMPARISON:  02/25/2021 FINDINGS: Unchanged cardiomediastinal silhouette. Cardiac device overlies the left mid chest. Decreased size of the left pleural effusion. There is slight improved aeration in the left lung base. Of there is no visible pneumothorax. Bones are unchanged. IMPRESSION: Decreased size of the left pleural effusion with slight improved aeration in the left lung base. Electronically Signed   By: Maurine Simmering   On: 03/09/2021 09:45   CT Chest Wo Contrast  Result Date: 03/21/2021 CLINICAL DATA:  Chest pain and shortness of breath EXAM: CT CHEST WITHOUT CONTRAST TECHNIQUE: Multidetector CT imaging of the chest was performed following the standard protocol without IV contrast. COMPARISON:  None. FINDINGS: Cardiovascular: Calcific aortic atherosclerosis and coronary artery atherosclerosis. Trace pericardial fluid. Heart size is normal. Mediastinum/Nodes: 13 mm preaortic lymph node. 15 mm subcarinal lymph node. No axillary adenopathy. Esophagus is unremarkable. Lungs/Pleura: Filling defects in the left lower lobe bronchial tree with associated atelectasis. Right lung is clear. Small left pleural effusion. There is a tunneled pleural catheter at the left lung base. Unchanged nodular foci in the lingula. Upper Abdomen: No acute abnormality. Musculoskeletal: No chest wall mass or suspicious bone lesions identified. IMPRESSION: 1. Filling defects in the left lower lobe bronchial tree with associated atelectasis, likely mucous plugging. 2. Small left pleural effusion with tunneled pleural catheter at the left lung base. 3. Mildly enlarged mediastinal lymph  nodes, likely reactive. Aortic Atherosclerosis (ICD10-I70.0). Electronically Signed   By: Ulyses Jarred M.D.   On: 03/21/2021 01:05   CT Angio Chest Pulmonary Embolism (PE) W or WO  Contrast  Result Date: 04/01/2021 CLINICAL DATA:  75 year old male with hemoptysis, recent diagnosis of lung cancer. Pulmonary embolism suspected. EXAM: CT ANGIOGRAPHY CHEST WITH CONTRAST TECHNIQUE: Multidetector CT imaging of the chest was performed using the standard protocol during bolus administration of intravenous contrast. Multiplanar CT image reconstructions and MIPs were obtained to evaluate the vascular anatomy. CONTRAST:  100 mL Omnipaque 350, intravenous COMPARISON:  03/21/2021, 03/24/2021 FINDINGS: Cardiovascular: Satisfactory opacification of the pulmonary arteries to the segmental level. No evidence of pulmonary embolism. Normal heart size. Severe coronary atherosclerotic calcifications. Scattered thoracic aortic atherosclerotic calcifications. No pericardial effusion. Mediastinum/Nodes: Multiple prominent aortopulmonic window lymph nodes, the largest measuring up to 14 mm in short axis, similar to comparison. The thyroid gland is within normal limits. The esophagus is mildly patulous throughout. Trachea is within normal limits. No axillary or supraclavicular lymphadenopathy. Lungs/Pleura: Similar appearing central left lower lobe opacifications with associated architectural distortion, difficult to measure giving extent from the left lung base to the hilum, overall similar to recent comparisons. Similar appearing left basilar pleural thickening and multifocal subpleural left upper and lower lobe nodular opacities, the largest measuring up to 12 mm in the lingula. Interval development of element of diffuse ground-glass and reticular opacifications in the left lower lobe. No pneumothorax. The right lung is clear. Upper Abdomen: Similar appearing hypodense, poorly defined mass in the caudate lobe of the liver measuring up to approximately 3.6 cm. The remaining visualized upper abdomen is within normal limits. Musculoskeletal: Partially visualized anterior cervical fusion hardware in place without  complicating features. Multilevel degenerative changes of the thoracic spine. No acute osseous abnormality or aggressive appearing osseous lesion. Review of the MIP images confirms the above findings. IMPRESSION: Vascular: 1. No pulmonary embolism. 2. Coronary and aortic atherosclerosis (ICD10-I70.0). Non-Vascular: 1. Interval development of ground-glass and reticular opacities in the left lower lobe as could be seen with progressive lymphangitic carcinomatosis secondary to known advanced stage left lower lobe lung mass. Superimposed pneumonia could appear similarly. 2. Overall similar appearance of poorly defined, extensive central left lower lobe mass and associated left basilar pleural thickening and scattered left upper and lower lobe pleural nodularity. 3. Similar appearing hypoattenuating caudate lobe mass and left mediastinal lymphadenopathy compatible with known metastatic disease. Ruthann Cancer, MD [facility] Radiology Electronically Signed   By: Ruthann Cancer MD   On: 04/01/2021 07:30   MR BRAIN W WO CONTRAST  Result Date: 03/19/2021 CLINICAL DATA:  Staging of non-small cell lung carcinoma EXAM: MRI HEAD WITHOUT AND WITH CONTRAST TECHNIQUE: Multiplanar, multiecho pulse sequences of the brain and surrounding structures were obtained without and with intravenous contrast. CONTRAST:  3m GADAVIST GADOBUTROL 1 MMOL/ML IV SOLN COMPARISON:  07/06/2012 FINDINGS: Brain: No acute infarct, mass effect or extra-axial collection. No acute or chronic hemorrhage. Hyperintense T2-weighted signal is moderately widespread throughout the white matter. Generalized volume loss without a clear lobar predilection. Old left parietal infarct. The midline structures are normal. There is no abnormal contrast enhancement. Vascular: Major flow voids are preserved. Skull and upper cervical spine: Normal calvarium and skull base. Visualized upper cervical spine and soft tissues are  normal. Sinuses/Orbits:No paranasal sinus fluid levels or advanced mucosal thickening. No mastoid or middle ear effusion. Normal orbits. IMPRESSION: 1. No intracranial metastatic disease. 2. Old left parietal infarct and findings  of chronic microvascular disease. Electronically Signed   By: Ulyses Jarred M.D.   On: 03/19/2021 22:06   NM PET Image Initial (PI) Skull Base To Thigh  Result Date: 03/25/2021 CLINICAL DATA:  Initial treatment strategy for lung cancer. EXAM: NUCLEAR MEDICINE PET SKULL BASE TO THIGH TECHNIQUE: 11.3 mCi F-18 FDG was injected intravenously. Full-ring PET imaging was performed from the skull base to thigh after the radiotracer. CT data was obtained and used for attenuation correction and anatomic localization. Fasting blood glucose: 96 mg/dl COMPARISON:  CT chest 03/21/2021, 02/24/2021. FINDINGS: Mediastinal blood pool activity: SUV max 2.6 Liver activity: SUV max NA NECK: No abnormal hypermetabolism. Incidental CT findings: None. CHEST: Mediastinal lymph nodes measure up to 11 mm in the AP window an SUV max 23. Subcarinal lymph nodes measure up to 1.4 cm with an SUV max 15.0. Left hilar hypermetabolism, SUV max 11.0. Prepericardiac lymph nodes measure up to 11 mm with an SUV max of 8.6. Central left lower lobe mass is difficult to measure without IV contrast but dimensions are roughly 2.8 x 3.8 cm with an SUV max 14.8. Pleural thickening and hypermetabolic pleural/subpleural nodularity in the left hemithorax. Index subpleural lingular nodule measures 11 mm (8/41) with an SUV max of 9.1. Incidental CT findings: Interstitial thickening and nodularity in the left lower lobe, worrisome for lymphangitic carcinomatosis. Partial obstruction of the left lower lobe bronchus. Atherosclerotic calcification of the aorta, aortic valve and coronary arteries. Heart is at the upper limits of normal in size to mildly enlarged. No pericardial effusion. Thin rind of pleural fluid at the base of the left  hemithorax with a percutaneous drain in place. ABDOMEN/PELVIS: Hypermetabolic lesions are seen in the dome of the liver, right hepatic lobe and caudate. Caudate mass measures 3.7 x 4.6 cm with an SUV max of 19.0. No abnormal hypermetabolism in the adrenal glands, spleen or pancreas. Hypermetabolic celiac axis lymph node measures 7 mm (4/119) with an SUV max of 7.8. No additional hypermetabolic lymph nodes. Incidental CT findings: Liver, gallbladder, adrenal glands and right kidney are unremarkable. Subcentimeter high and low-attenuation lesions in the left kidney are too small to characterize. Spleen, pancreas, stomach and bowel are unremarkable. Atherosclerotic calcification of the aorta. SKELETON: No abnormal hypermetabolism. Incidental CT findings: Degenerative and postoperative changes in the spine. IMPRESSION: 1. Hypermetabolic left lower lobe mass with hypermetabolic intrathoracic and upper abdominal lymph nodes, left pleural/subpleural nodularity and hepatic lesions, consistent with stage IV primary bronchogenic carcinoma. 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 2. Small loculated left pleural effusion with pleural drainage catheter in place. 3.  Aortic atherosclerosis (ICD10-I70.0). Electronically Signed   By: Lorin Picket M.D.   On: 03/25/2021 10:17   DG CHEST PORT 1 VIEW  Result Date: 03/17/2021 CLINICAL DATA:  Status post PleurX catheter placement EXAM: PORTABLE CHEST 1 VIEW COMPARISON:  04/08/2021 FINDINGS: Cardiac shadow is enlarged. Loop recorder is again identified. New PleurX catheter is noted on the left. Small residual effusion remains. No pneumothorax is identified. IMPRESSION: PleurX catheter in satisfactory position. Small residual effusion is noted. No pneumothorax is seen. Electronically Signed   By: Inez Catalina M.D.   On: 03/17/2021 16:00   CUP PACEART REMOTE DEVICE CHECK  Result Date: 03/30/2021 ILR summary report received. Battery status OK. Normal device  function. No new symptom, tachy, brady, or pause episodes. No new AF episodes. 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  He is negative for any actionable mutations. His PDL1 expression is 30%.  The patient completed palliative radiotherapy to the obstructive lung mass in the left lung under the care of Dr. Tammi Klippel.  Last treatment on 03/23/2021.  He is currently undergoing palliative systemic chemotherapy. His treatment consists carboplatin for an AUC of 5, Alimta 500 mg/m, and Keytruda 200 mg IV every 3 weeks. He is status post 1 cycle.   The patient was seen with Dr. [name] today. Labs were reviewed. Recommend that he continue on the same treatment at the same dose. His ANC is 1.5 today. He is currently taking anti-biotics and denies symptoms of infection. Dr. [name] reviewed neutropenic precautions and we will continue to monitor his labs closely every week.    We will see him back for a follow up visit in 2 weeks for evalution before starting cycle #2.   He will continue with his inhalers as prescribed by pulmonology. If he has signficant hemoptyosis, the patient knows to seek emergency evaluation and to hold his blood thinner.   The patient has a challenging time with IV access. I discussed a port-a-cath with the patient and he is interested in this. I have placed the order and have sent EMLA cream to the pharmacy. He is on a blood thinner for atrial fibrillation and will need clearance from cardiology to hold for this procedure.   We will follow up on the request for a shower chair and walker with wheels. If the supply  company did not receive this order, then we will resubmit the order.    The patient was advised to call immediately if he has any concerning symptoms in the interval. The patient voices understanding of current disease status and treatment options and is in agreement with the current care plan. All questions were answered. The patient knows to call the clinic with any problems, questions or concerns. We can certainly see the patient much sooner if necessary         Orders Placed This Encounter  Procedures   IR IMAGING GUIDED PORT INSERTION    Standing Status:   Future    Standing Expiration Date:   05-07-22    Order Specific Question:   Reason for Exam (SYMPTOM  OR DIAGNOSIS REQUIRED)    Answer:   Poor venous access and undergoing chemotherapy. Next treatment is 7/18 so ideally have the port before then.    Order Specific Question:   Preferred Imaging Location?    Answer:   [place], [facility] 04/07/21  ADDENDUM: Hematology/Oncology Attending: I had a face-to-face encounter with the patient today.  I reviewed his records, lab and recommended his care plan.  This is a very pleasant 75 years old white male recently diagnosed with a stage IV non-small cell lung cancer, adenocarcinoma with no actionable mutation and PD-L1 expression of 30%. The patient underwent palliative radiotherapy to the obstructive left lung mass. He is currently undergoing systemic chemotherapy with carboplatin, Alimta and Keytruda started last week.  The patient tolerated the first week of his treatment well but recently was seen at the emergency department complaining of worsening shortness of breath as well as cough with blood-tinged sputum.  He  was advised to hold his treatment with Eliquis and his hemoptysis significantly improved.  He was treated with a course of antibiotics and feeling much better.  His absolute neutrophil count is low today at 1500 but the patient is  afebrile. He resumed his treatment with anticoagulation and no additional hemoptysis. I recommended for the patient to continue with the weekly lab and he will come back for follow-up visit in 2 weeks for evaluation before starting cycle #2. He was advised to hold his Eliquis if he develop any additional hemoptysis and to call immediately or go to the emergency department for evaluation. The patient and his wife are in agreement with the current plan. He was advised to call immediately if he has any other concerning symptoms in the interval.  Disclaimer: This note was dictated with voice recognition software. Similar sounding words can inadvertently be transcribed and may be missed upon review. Eilleen Kempf, MD 04/07/21

## 2021-04-07 ENCOUNTER — Inpatient Hospital Stay: Payer: Medicare PPO | Attending: Physician Assistant | Admitting: Physician Assistant

## 2021-04-07 ENCOUNTER — Inpatient Hospital Stay: Payer: Medicare PPO

## 2021-04-07 ENCOUNTER — Other Ambulatory Visit: Payer: Self-pay

## 2021-04-07 VITALS — BP 114/66 | HR 77 | Temp 97.6°F | Resp 19 | Ht 74.0 in | Wt 244.8 lb

## 2021-04-07 DIAGNOSIS — C3492 Malignant neoplasm of unspecified part of left bronchus or lung: Secondary | ICD-10-CM | POA: Diagnosis not present

## 2021-04-07 DIAGNOSIS — C3432 Malignant neoplasm of lower lobe, left bronchus or lung: Secondary | ICD-10-CM

## 2021-04-07 DIAGNOSIS — D6481 Anemia due to antineoplastic chemotherapy: Secondary | ICD-10-CM | POA: Diagnosis not present

## 2021-04-07 DIAGNOSIS — Z79899 Other long term (current) drug therapy: Secondary | ICD-10-CM | POA: Diagnosis not present

## 2021-04-07 DIAGNOSIS — Z5112 Encounter for antineoplastic immunotherapy: Secondary | ICD-10-CM | POA: Diagnosis not present

## 2021-04-07 DIAGNOSIS — C787 Secondary malignant neoplasm of liver and intrahepatic bile duct: Secondary | ICD-10-CM | POA: Diagnosis not present

## 2021-04-07 DIAGNOSIS — Z5111 Encounter for antineoplastic chemotherapy: Secondary | ICD-10-CM | POA: Insufficient documentation

## 2021-04-07 DIAGNOSIS — C3412 Malignant neoplasm of upper lobe, left bronchus or lung: Secondary | ICD-10-CM | POA: Insufficient documentation

## 2021-04-07 DIAGNOSIS — C778 Secondary and unspecified malignant neoplasm of lymph nodes of multiple regions: Secondary | ICD-10-CM | POA: Insufficient documentation

## 2021-04-07 LAB — CMP (CANCER CENTER ONLY)
ALT: 17 U/L (ref 0–44)
AST: 13 U/L — ABNORMAL LOW (ref 15–41)
Albumin: 2.8 g/dL — ABNORMAL LOW (ref 3.5–5.0)
Alkaline Phosphatase: 52 U/L (ref 38–126)
Anion gap: 8 (ref 5–15)
BUN: 20 mg/dL (ref 8–23)
CO2: 26 mmol/L (ref 22–32)
Calcium: 8.8 mg/dL — ABNORMAL LOW (ref 8.9–10.3)
Chloride: 102 mmol/L (ref 98–111)
Creatinine: 0.8 mg/dL (ref 0.61–1.24)
GFR, Estimated: >60 mL/min (ref >60)
Glucose, Bld: 99 mg/dL (ref 70–99)
Potassium: 4.3 mmol/L (ref 3.5–5.1)
Sodium: 136 mmol/L (ref 135–145)
Total Bilirubin: 0.5 mg/dL (ref 0.3–1.2)
Total Protein: 5.9 g/dL — ABNORMAL LOW (ref 6.5–8.1)

## 2021-04-07 LAB — CBC WITH DIFFERENTIAL (CANCER CENTER ONLY)
Abs Immature Granulocytes: 0.01 10*3/uL (ref 0.00–0.07)
Basophils Absolute: 0 10*3/uL (ref 0.0–0.1)
Basophils Relative: 1 %
Eosinophils Absolute: 0.1 10*3/uL (ref 0.0–0.5)
Eosinophils Relative: 3 %
HCT: 29.7 % — ABNORMAL LOW (ref 39.0–52.0)
Hemoglobin: 9.6 g/dL — ABNORMAL LOW (ref 13.0–17.0)
Immature Granulocytes: 1 %
Lymphocytes Relative: 9 %
Lymphs Abs: 0.2 10*3/uL — ABNORMAL LOW (ref 0.7–4.0)
MCH: 29.6 pg (ref 26.0–34.0)
MCHC: 32.3 g/dL (ref 30.0–36.0)
MCV: 91.7 fL (ref 80.0–100.0)
Monocytes Absolute: 0.1 10*3/uL (ref 0.1–1.0)
Monocytes Relative: 5 %
Neutro Abs: 1.5 10*3/uL — ABNORMAL LOW (ref 1.7–7.7)
Neutrophils Relative %: 81 %
Platelet Count: 175 10*3/uL (ref 150–400)
RBC: 3.24 MIL/uL — ABNORMAL LOW (ref 4.22–5.81)
RDW: 15.1 % (ref 11.5–15.5)
WBC Count: 1.9 10*3/uL — ABNORMAL LOW (ref 4.0–10.5)
nRBC: 0 % (ref 0.0–0.2)

## 2021-04-07 LAB — TSH: TSH: 0.909 u[IU]/mL (ref 0.320–4.118)

## 2021-04-07 MED ORDER — LIDOCAINE-PRILOCAINE 2.5-2.5 % EX CREA: 1.0000 "application " | TOPICAL_CREAM | CUTANEOUS | 2 refills | Status: DC | PRN

## 2021-04-08 ENCOUNTER — Other Ambulatory Visit: Payer: Self-pay

## 2021-04-08 DIAGNOSIS — C3492 Malignant neoplasm of unspecified part of left bronchus or lung: Secondary | ICD-10-CM

## 2021-04-08 DIAGNOSIS — C61 Malignant neoplasm of prostate: Secondary | ICD-10-CM

## 2021-04-08 NOTE — Progress Notes (Signed)
Spoke with Thedore Mins at [facility]

## 2021-04-09 ENCOUNTER — Encounter (HOSPITAL_COMMUNITY): Payer: Self-pay

## 2021-04-09 ENCOUNTER — Telehealth: Payer: Self-pay | Admitting: Pulmonary Disease

## 2021-04-09 DIAGNOSIS — I480 Paroxysmal atrial fibrillation: Secondary | ICD-10-CM | POA: Diagnosis not present

## 2021-04-09 DIAGNOSIS — Z1509 Genetic susceptibility to other malignant neoplasm: Secondary | ICD-10-CM | POA: Insufficient documentation

## 2021-04-09 DIAGNOSIS — C3432 Malignant neoplasm of lower lobe, left bronchus or lung: Secondary | ICD-10-CM | POA: Diagnosis not present

## 2021-04-09 DIAGNOSIS — I2510 Atherosclerotic heart disease of native coronary artery without angina pectoris: Secondary | ICD-10-CM | POA: Diagnosis not present

## 2021-04-09 DIAGNOSIS — R531 Weakness: Secondary | ICD-10-CM | POA: Diagnosis not present

## 2021-04-09 DIAGNOSIS — Z48813 Encounter for surgical aftercare following surgery on the respiratory system: Secondary | ICD-10-CM | POA: Diagnosis not present

## 2021-04-09 DIAGNOSIS — J910 Malignant pleural effusion: Secondary | ICD-10-CM | POA: Diagnosis not present

## 2021-04-09 DIAGNOSIS — D630 Anemia in neoplastic disease: Secondary | ICD-10-CM | POA: Diagnosis not present

## 2021-04-09 DIAGNOSIS — I10 Essential (primary) hypertension: Secondary | ICD-10-CM | POA: Diagnosis not present

## 2021-04-09 DIAGNOSIS — G4733 Obstructive sleep apnea (adult) (pediatric): Secondary | ICD-10-CM | POA: Diagnosis not present

## 2021-04-09 DIAGNOSIS — R042 Hemoptysis: Secondary | ICD-10-CM | POA: Diagnosis not present

## 2021-04-09 DIAGNOSIS — Z1589 Genetic susceptibility to other disease: Secondary | ICD-10-CM | POA: Insufficient documentation

## 2021-04-09 DIAGNOSIS — C61 Malignant neoplasm of prostate: Secondary | ICD-10-CM | POA: Diagnosis not present

## 2021-04-09 DIAGNOSIS — Z1379 Encounter for other screening for genetic and chromosomal anomalies: Secondary | ICD-10-CM | POA: Insufficient documentation

## 2021-04-09 DIAGNOSIS — Z1501 Genetic susceptibility to malignant neoplasm of breast: Secondary | ICD-10-CM | POA: Insufficient documentation

## 2021-04-09 DIAGNOSIS — J441 Chronic obstructive pulmonary disease with (acute) exacerbation: Secondary | ICD-10-CM | POA: Diagnosis not present

## 2021-04-09 DIAGNOSIS — Z4801 Encounter for change or removal of surgical wound dressing: Secondary | ICD-10-CM | POA: Diagnosis not present

## 2021-04-09 NOTE — Telephone Encounter (Signed)
Call returned to patient, confirmed DOB. Confirmed medications. Made Mary aware our provider in this clinic did not provide those medications. Prescribing MD names given. Voiced understanding.   Nothing further needed at this time.

## 2021-04-09 NOTE — Telephone Encounter (Signed)
Call returned to [facility], she is aware she will need to contact patient's PCP to address the medications.   Voiced understanding.   Nothing further needed at this time.

## 2021-04-10 DIAGNOSIS — C3490 Malignant neoplasm of unspecified part of unspecified bronchus or lung: Secondary | ICD-10-CM | POA: Diagnosis not present

## 2021-04-10 DIAGNOSIS — R042 Hemoptysis: Secondary | ICD-10-CM | POA: Diagnosis not present

## 2021-04-10 DIAGNOSIS — D6869 Other thrombophilia: Secondary | ICD-10-CM | POA: Diagnosis not present

## 2021-04-13 ENCOUNTER — Telehealth: Payer: Self-pay | Admitting: Cardiovascular Disease

## 2021-04-13 ENCOUNTER — Telehealth: Payer: Self-pay | Admitting: Adult Health

## 2021-04-13 ENCOUNTER — Other Ambulatory Visit: Payer: Self-pay

## 2021-04-13 ENCOUNTER — Telehealth: Payer: Self-pay | Admitting: Genetic Counselor

## 2021-04-13 ENCOUNTER — Encounter: Payer: Self-pay | Admitting: Genetic Counselor

## 2021-04-13 MED ORDER — TOPIRAMATE 50 MG PO TABS
100.0000 mg | ORAL_TABLET | Freq: Two times a day (BID) | ORAL | 3 refills | Status: DC
Start: 2021-04-13 — End: 2021-08-25

## 2021-04-13 NOTE — Telephone Encounter (Signed)
Pt has r/s his f/u and is now asking for a refill on his topiramate (TOPAMAX) 50 MG tablet to [id number]

## 2021-04-13 NOTE — Telephone Encounter (Signed)
Nose Bleed x 2 days off and on ,but steady ooze". He is following up with Dr Cathie Olden.

## 2021-04-13 NOTE — Telephone Encounter (Signed)
Pt states he has been having nose bleeds for about 3-4 days now.  Pt states it is a steady ooze, not running like a faucet.  Pt held his Xarelto Saturday due to bleeding and didn't have any trouble Sunday.  Took Xarelto Sunday night and woke up with a nose bleed this morning.  Only bleeding from one side.  Starts out bright red and gets to more of a brown color towards the end of the day.  Uses nose sprays.  Held them Sunday as well incase they were causing issues.  Currently plans to hold Xarelto for 3 days until he has a PICC line placed on Thursday to assist with lab draws and chemo.  Last CBC was 7/5 and platelet count was 175, Hgb 9.6.  Advised I will send to Dr. Acie Fredrickson for review.

## 2021-04-13 NOTE — Telephone Encounter (Signed)
Pt c/o medication issue:  1. Name of Medication: XARELTO 20 MG TABS tablet  2. How are you currently taking this medication (dosage and times per day)? As prescribed  3. Are you having a reaction (difficulty breathing--STAT)? Nose bleeds  4. What is your medication issue? Pt is calling he is  a stage 4 cancer pt and this medication is making hm have nose bleeds

## 2021-04-13 NOTE — Telephone Encounter (Signed)
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   Reviewed associated risks for breast cancer, prostate cancer, and pancreatic cancer. Corey [name] is interested in a follow-up genetic counseling appointment to discuss this result in greater detail, but would like to discuss with his brothers first and involve them in the appointment if possible. He will call back when he is ready to schedule this appointment. In the meantime, he knows he can call us at any time with additional questions.   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 The presence of a VUS should not impact medical management.

## 2021-04-13 NOTE — Telephone Encounter (Signed)
approved

## 2021-04-14 ENCOUNTER — Other Ambulatory Visit: Payer: Self-pay

## 2021-04-14 ENCOUNTER — Encounter: Payer: Self-pay | Admitting: Internal Medicine

## 2021-04-14 ENCOUNTER — Other Ambulatory Visit: Payer: Self-pay | Admitting: Physician Assistant

## 2021-04-14 ENCOUNTER — Inpatient Hospital Stay: Payer: Medicare PPO

## 2021-04-14 ENCOUNTER — Telehealth: Payer: Self-pay | Admitting: Physician Assistant

## 2021-04-14 DIAGNOSIS — C3492 Malignant neoplasm of unspecified part of left bronchus or lung: Secondary | ICD-10-CM

## 2021-04-14 DIAGNOSIS — Z5112 Encounter for antineoplastic immunotherapy: Secondary | ICD-10-CM | POA: Diagnosis not present

## 2021-04-14 DIAGNOSIS — D649 Anemia, unspecified: Secondary | ICD-10-CM

## 2021-04-14 DIAGNOSIS — D6481 Anemia due to antineoplastic chemotherapy: Secondary | ICD-10-CM | POA: Diagnosis not present

## 2021-04-14 DIAGNOSIS — Z79899 Other long term (current) drug therapy: Secondary | ICD-10-CM | POA: Diagnosis not present

## 2021-04-14 DIAGNOSIS — Z5111 Encounter for antineoplastic chemotherapy: Secondary | ICD-10-CM | POA: Diagnosis not present

## 2021-04-14 DIAGNOSIS — C3412 Malignant neoplasm of upper lobe, left bronchus or lung: Secondary | ICD-10-CM | POA: Diagnosis not present

## 2021-04-14 DIAGNOSIS — C778 Secondary and unspecified malignant neoplasm of lymph nodes of multiple regions: Secondary | ICD-10-CM | POA: Diagnosis not present

## 2021-04-14 DIAGNOSIS — C787 Secondary malignant neoplasm of liver and intrahepatic bile duct: Secondary | ICD-10-CM | POA: Diagnosis not present

## 2021-04-14 LAB — CMP (CANCER CENTER ONLY)
ALT: 14 U/L (ref 0–44)
AST: 14 U/L — ABNORMAL LOW (ref 15–41)
Albumin: 2.7 g/dL — ABNORMAL LOW (ref 3.5–5.0)
Alkaline Phosphatase: 51 U/L (ref 38–126)
Anion gap: 7 (ref 5–15)
BUN: 15 mg/dL (ref 8–23)
CO2: 24 mmol/L (ref 22–32)
Calcium: 8.9 mg/dL (ref 8.9–10.3)
Chloride: 101 mmol/L (ref 98–111)
Creatinine: 0.8 mg/dL (ref 0.61–1.24)
GFR, Estimated: >60 mL/min (ref >60)
Glucose, Bld: 123 mg/dL — ABNORMAL HIGH (ref 70–99)
Potassium: 4.1 mmol/L (ref 3.5–5.1)
Sodium: 132 mmol/L — ABNORMAL LOW (ref 135–145)
Total Bilirubin: 0.6 mg/dL (ref 0.3–1.2)
Total Protein: 5.9 g/dL — ABNORMAL LOW (ref 6.5–8.1)

## 2021-04-14 LAB — CBC WITH DIFFERENTIAL (CANCER CENTER ONLY)
Abs Immature Granulocytes: 0.02 10*3/uL (ref 0.00–0.07)
Basophils Absolute: 0 10*3/uL (ref 0.0–0.1)
Basophils Relative: 0 %
Eosinophils Absolute: 0 10*3/uL (ref 0.0–0.5)
Eosinophils Relative: 1 %
HCT: 25.4 % — ABNORMAL LOW (ref 39.0–52.0)
Hemoglobin: 8.4 g/dL — ABNORMAL LOW (ref 13.0–17.0)
Immature Granulocytes: 1 %
Lymphocytes Relative: 8 %
Lymphs Abs: 0.2 10*3/uL — ABNORMAL LOW (ref 0.7–4.0)
MCH: 29.6 pg (ref 26.0–34.0)
MCHC: 33.1 g/dL (ref 30.0–36.0)
MCV: 89.4 fL (ref 80.0–100.0)
Monocytes Absolute: 0.2 10*3/uL (ref 0.1–1.0)
Monocytes Relative: 10 %
Neutro Abs: 1.5 10*3/uL — ABNORMAL LOW (ref 1.7–7.7)
Neutrophils Relative %: 80 %
Platelet Count: 53 10*3/uL — ABNORMAL LOW (ref 150–400)
RBC: 2.84 MIL/uL — ABNORMAL LOW (ref 4.22–5.81)
RDW: 15.4 % (ref 11.5–15.5)
WBC Count: 1.9 10*3/uL — ABNORMAL LOW (ref 4.0–10.5)
nRBC: 0 % (ref 0.0–0.2)

## 2021-04-14 NOTE — Telephone Encounter (Signed)
BI please advise. Thanks     Dr Valeta Harms. I have had an increase in night time coughing the last few nights. I'm coughing up some flim, clear. The long acting inhaler seems to be irritating me when I try to use it. I take the Benzonatate 200 mg about 35-45 mins before using it. When I go to inhale it, I start coughing immediately. On the 2nd puff it's better but still irritating. I'm using the Albuterol before bed. it helps. I'm still using the hydrocodone cough suppressant. It helps while I'm asleep. But as soon as I awake I have mini coughing spells. I eventually fall back to sleep. I awake several times a night. My rest is broken and I feel it each day. I'm taking a nap of 1-2 hours daily to try to build stamina. Do I need to come in for a look see? Thanks Mingo Amber [phone]

## 2021-04-14 NOTE — Progress Notes (Signed)
Met with patient at registration to introduce myself as Arboriculturist and to offer available resources.  Discussed one-time $1000 Radio broadcast assistant to assist with personal expenses while going through treatment.  Gave him my card if interested in applying and for any additional financial questions or concerns.

## 2021-04-14 NOTE — Telephone Encounter (Signed)
I called the patient to review his lab work with him today.  The patient's WBC count is a little bit low today but his ANC is 1.5.  No need for CSF today but I did review neutropenic precautions with the patient.  He did mention that he was having a slight burning with urination 1 time today.  Advised him to monitor this and if the symptoms persist that he can drop off a urinalysis at our office or his PCPs office.  He is also been having some nosebleeding and was on a blood thinner.  His platelet count is 53,000 today.  Recommended holding his blood thinner which he is doing anyway for his Port-A-Cath procedure later this week.  Discussed that he can resume this once IR advises him to restarted after his procedure as long as he does not have any more nosebleeds.  I also discussed that we will continue to keep an eye in his hemoglobin.  I will arrange for a blood transfusion if his hemoglobin drops less than 8.  Discussed that we will add a sample to blood bank to his lab draws next week and not to take off the blue bracelet.  He expressed understanding with the instructions.

## 2021-04-15 ENCOUNTER — Other Ambulatory Visit: Payer: Self-pay | Admitting: Radiology

## 2021-04-15 DIAGNOSIS — I480 Paroxysmal atrial fibrillation: Secondary | ICD-10-CM | POA: Diagnosis not present

## 2021-04-15 DIAGNOSIS — I2510 Atherosclerotic heart disease of native coronary artery without angina pectoris: Secondary | ICD-10-CM | POA: Diagnosis not present

## 2021-04-15 DIAGNOSIS — D630 Anemia in neoplastic disease: Secondary | ICD-10-CM | POA: Diagnosis not present

## 2021-04-15 DIAGNOSIS — Z48813 Encounter for surgical aftercare following surgery on the respiratory system: Secondary | ICD-10-CM | POA: Diagnosis not present

## 2021-04-15 DIAGNOSIS — J910 Malignant pleural effusion: Secondary | ICD-10-CM | POA: Diagnosis not present

## 2021-04-15 DIAGNOSIS — Z4801 Encounter for change or removal of surgical wound dressing: Secondary | ICD-10-CM | POA: Diagnosis not present

## 2021-04-15 DIAGNOSIS — C61 Malignant neoplasm of prostate: Secondary | ICD-10-CM | POA: Diagnosis not present

## 2021-04-15 DIAGNOSIS — I10 Essential (primary) hypertension: Secondary | ICD-10-CM | POA: Diagnosis not present

## 2021-04-15 DIAGNOSIS — C3432 Malignant neoplasm of lower lobe, left bronchus or lung: Secondary | ICD-10-CM | POA: Diagnosis not present

## 2021-04-15 NOTE — Telephone Encounter (Signed)
Spoke to the patient, he has been holding Xarelto since Monday with no nose bleeds since then. Wanted to make note that he is having a port placed for oncology tomorrow and will resume his Xarelto 24 hours post placement.

## 2021-04-16 ENCOUNTER — Other Ambulatory Visit: Payer: Self-pay

## 2021-04-16 ENCOUNTER — Encounter (HOSPITAL_COMMUNITY): Payer: Self-pay

## 2021-04-16 ENCOUNTER — Ambulatory Visit (HOSPITAL_COMMUNITY)
Admission: RE | Admit: 2021-04-16 | Discharge: 2021-04-16 | Disposition: A | Discharge status: Home / Self Care | Payer: Medicare PPO | Source: Ambulatory Visit | Attending: Physician Assistant | Admitting: Physician Assistant

## 2021-04-16 DIAGNOSIS — I2510 Atherosclerotic heart disease of native coronary artery without angina pectoris: Secondary | ICD-10-CM | POA: Diagnosis not present

## 2021-04-16 DIAGNOSIS — Z7901 Long term (current) use of anticoagulants: Secondary | ICD-10-CM | POA: Insufficient documentation

## 2021-04-16 DIAGNOSIS — G4733 Obstructive sleep apnea (adult) (pediatric): Secondary | ICD-10-CM | POA: Insufficient documentation

## 2021-04-16 DIAGNOSIS — Z79899 Other long term (current) drug therapy: Secondary | ICD-10-CM | POA: Insufficient documentation

## 2021-04-16 DIAGNOSIS — C801 Malignant (primary) neoplasm, unspecified: Secondary | ICD-10-CM | POA: Diagnosis not present

## 2021-04-16 DIAGNOSIS — Z885 Allergy status to narcotic agent status: Secondary | ICD-10-CM | POA: Insufficient documentation

## 2021-04-16 DIAGNOSIS — Z87891 Personal history of nicotine dependence: Secondary | ICD-10-CM | POA: Insufficient documentation

## 2021-04-16 DIAGNOSIS — I480 Paroxysmal atrial fibrillation: Secondary | ICD-10-CM | POA: Diagnosis not present

## 2021-04-16 DIAGNOSIS — Z888 Allergy status to other drugs, medicaments and biological substances status: Secondary | ICD-10-CM | POA: Diagnosis not present

## 2021-04-16 DIAGNOSIS — F419 Anxiety disorder, unspecified: Secondary | ICD-10-CM | POA: Diagnosis not present

## 2021-04-16 DIAGNOSIS — C3432 Malignant neoplasm of lower lobe, left bronchus or lung: Secondary | ICD-10-CM | POA: Insufficient documentation

## 2021-04-16 DIAGNOSIS — K219 Gastro-esophageal reflux disease without esophagitis: Secondary | ICD-10-CM | POA: Insufficient documentation

## 2021-04-16 DIAGNOSIS — Z881 Allergy status to other antibiotic agents status: Secondary | ICD-10-CM | POA: Insufficient documentation

## 2021-04-16 DIAGNOSIS — Z452 Encounter for adjustment and management of vascular access device: Secondary | ICD-10-CM | POA: Diagnosis not present

## 2021-04-16 HISTORY — PX: IR IMAGING GUIDED PORT INSERTION: IMG5740

## 2021-04-16 MED ORDER — MIDAZOLAM HCL 2 MG/2ML IJ SOLN
INTRAMUSCULAR | Status: AC | PRN
  Administered 2021-04-16 (×2): 1 mg via INTRAVENOUS

## 2021-04-16 MED ORDER — IOHEXOL 300 MG/ML  SOLN: 50.0000 mL | Freq: Once | INTRAMUSCULAR | Status: DC | PRN

## 2021-04-16 MED ORDER — HEPARIN SOD (PORK) LOCK FLUSH 100 UNIT/ML IV SOLN
INTRAVENOUS | Status: AC
  Filled 2021-04-16: qty 5

## 2021-04-16 MED ORDER — LIDOCAINE-EPINEPHRINE 1 %-1:100000 IJ SOLN
INTRAMUSCULAR | Status: AC | PRN
  Administered 2021-04-16 (×2): 10 mL via INTRADERMAL

## 2021-04-16 MED ORDER — MIDAZOLAM HCL 2 MG/2ML IJ SOLN
INTRAMUSCULAR | Status: AC
  Filled 2021-04-16: qty 2

## 2021-04-16 MED ORDER — LIDOCAINE-EPINEPHRINE 1 %-1:100000 IJ SOLN
INTRAMUSCULAR | Status: AC
  Filled 2021-04-16: qty 1

## 2021-04-16 MED ORDER — HEPARIN SOD (PORK) LOCK FLUSH 100 UNIT/ML IV SOLN
INTRAVENOUS | Status: AC | PRN
  Administered 2021-04-16: 500 [IU] via INTRAVENOUS

## 2021-04-16 MED ORDER — FENTANYL CITRATE (PF) 100 MCG/2ML IJ SOLN
INTRAMUSCULAR | Status: AC
  Filled 2021-04-16: qty 2

## 2021-04-16 MED ORDER — FENTANYL CITRATE (PF) 100 MCG/2ML IJ SOLN
INTRAMUSCULAR | Status: AC | PRN
  Administered 2021-04-16: 50 ug via INTRAVENOUS

## 2021-04-16 NOTE — H&P (Signed)
Chief Complaint: Patient was seen in consultation today for port placement.  Referring Physician(s): Heilingoetter,Cassandra L  Supervising Physician: Jacqulynn Cadet  Patient Status: [name] - Out-pt  History of Present Illness: [name] is a 75 y.o. male with a past medical history significant for anxiety, OSA (uses CPAP at night), arthritis, GERD, anemia, CAD, CVA, paroxysmal a.fib on Xarelto, prostate cancer s/p radiation and stage IV non-small cell lung cancer who presents today for port placement. [name] with complaints of dyspnea and left sided chest pain, he was found to have a large left pleural effusion as well as left infrahilar mass extending into the medial left lower lobe. He underwent thoracentesis and pleural fluid evaluation showed malignant cells. He then underwent bronchoscopy with FNA of level 7 lymph node which showed non-small cell carcinoma. He had a left pleurx placed and underwent palliative radiation. He has had one cycle of systemic chemotherapy thus far and IR has been asked to place a port prior to subsequent chemotherapy sessions.  [name] denies any complaints today except for chronic unchanged cough. He drains his pleurx every few days but has been in discussion about possibly having this removed soon. He understands the procedure today and is agreeable to proceed as planned.  Past Medical History:  Diagnosis Date   Anemia    Anxiety    Arthritis    "back, right knee, hands, ankles, neck" (05/31/2016)   Carotid artery disease ([place]) 08/30/2016   Carotid US 11/19: R 1-39, L 40-59 // Carotid US 08/2019: R 1-39; L 40-59 // Carotid US 11/21: R 1-39; L 40-59>> repeat 1 year    Chronic lower back pain    Coronary atherosclerosis of native coronary artery    a. BMS to [facility] 2004 and 2007, otherwise mild nonobstructive disease. EF normal.   Diverticulitis    Dyslipidemia    Dyspnea    Essential hypertension, benign     Family history of breast cancer    Family history of lung cancer    Family history of prostate cancer    GERD (gastroesophageal reflux disease)    Lumbar radiculopathy, chronic 02/04/2015   Right L5   Obesity    OSA on CPAP    uses cpap   Paroxysmal atrial fibrillation ([place])    a. Discovered after stroke.   Pneumonia 01/1996   PONV (postoperative nausea and vomiting)    Prostate CA ([place])    Recurrent upper respiratory infection (URI)    Stroke ([place]) 07/2012   no deficits   Visit for monitoring Tikosyn therapy 09/18/2019    Past Surgical History:  Procedure Laterality Date   ADENOIDECTOMY     ANTERIOR CERVICAL DECOMP/DISCECTOMY FUSION  07/2001; 10/2002   "C5-6; C6-7; redo"   BACK SURGERY     CARPAL TUNNEL RELEASE Left 10/2015   CHEST TUBE INSERTION Left 03/17/2021   Procedure: INSERTION PLEURAL DRAINAGE CATHETER;  Surgeon: Garner Nash, DO;  Location: [place];  Service: Pulmonary;  Laterality: Left;  Indwelling Tunneled Pleural catheter (PLEUREX)    COLONOSCOPY W/ POLYPECTOMY  02/2014   CORONARY ANGIOPLASTY WITH STENT PLACEMENT  05/2003; 12/2005   "mid RCA; mid RCA"   FINE NEEDLE ASPIRATION  02/26/2021   Procedure: FINE NEEDLE ASPIRATION (FNA) LINEAR;  Surgeon: Candee Furbish, MD;  Location: [facility] ENDOSCOPY;  Service: Pulmonary;;   HAND SURGERY  02/2019   LEFT HAND   implantable loop recorder placement  02/27/2020   Medtronic Reveal Linq  model [id number]  implantable loop recorder implanted by Dr Rayann Heman for afib management and evaluation of presyncope   JOINT REPLACEMENT     KNEE ARTHROSCOPY Left 10/2005   KNEE ARTHROSCOPY W/ PARTIAL MEDIAL MENISCECTOMY Left 09/2005   LUMBAR LAMINECTOMY/DECOMPRESSION MICRODISCECTOMY  03/2005   "L4-5"   POSTERIOR LUMBAR FUSION  10/2003   L5-S1; "plates, screws"   SHOULDER ARTHROSCOPY Right 08/2011   Debridement of labrum, arthroscopic distal clavicle excision   SHOULDER OPEN ROTATOR CUFF REPAIR Left 07/2014   TEE WITHOUT  CARDIOVERSION  07/07/2012   Procedure: TRANSESOPHAGEAL ECHOCARDIOGRAM (TEE);  Surgeon: Fay Records, MD;  Location: [name];  Service: Cardiovascular;  Laterality: N/A;   THORACENTESIS N/A 02/25/2021   Procedure: Mathews Robinsons;  Surgeon: Juanito Doom, MD;  Location: [place];  Service: Cardiopulmonary;  Laterality: N/A;   TONSILLECTOMY AND ADENOIDECTOMY  ~ 1956   TOTAL KNEE ARTHROPLASTY Left 10/2006   TRIGGER FINGER RELEASE Left 10/2015   VIDEO BRONCHOSCOPY WITH ENDOBRONCHIAL ULTRASOUND Left 02/26/2021   Procedure: VIDEO BRONCHOSCOPY WITH ENDOBRONCHIAL ULTRASOUND;  Surgeon: Candee Furbish, MD;  Location: [name];  Service: Pulmonary;  Laterality: Left;  cryoprobe too thanks!    Allergies: Amoxicillin, Fosinopril, Hydromorphone, Ampicillin, Monopril [fosinopril sodium], Other, and Rosuvastatin  Medications: Prior to Admission medications   Medication Sig Start Date End Date Taking? Authorizing Provider  acetaminophen (TYLENOL 8 HOUR ARTHRITIS PAIN) 650 MG CR tablet Take 1,300 mg by mouth every 8 (eight) hours as needed for pain (pain).   Yes [provider]  albuterol (VENTOLIN HFA) 108 (90 Base) MCG/ACT inhaler Inhale 2 puffs into the lungs every 4 (four) hours as needed for wheezing or shortness of breath. Inhale 2 puffs into the lungs every 4 hours as needed 04/04/21  Yes Rai, Ripudeep K, MD  atorvastatin (LIPITOR) 40 MG tablet Take 1 tablet (40 mg total) by mouth daily. 01/21/21  Yes Nahser, Wonda Cheng, MD  benzonatate (TESSALON) 200 MG capsule Take 1 capsule (200 mg total) by mouth 3 (three) times daily as needed for cough. 04/04/21  Yes Rai, Ripudeep K, MD  chlorpheniramine-HYDROcodone (TUSSIONEX) 10-8 MG/5ML SUER Take 5 mLs by mouth every 12 (twelve) hours as needed for cough. 04/04/21  Yes Rai, Ripudeep K, MD  dofetilide (TIKOSYN) 500 MCG capsule Take 1 capsule (500 mcg total) by mouth 2 (two) times daily. Please keep upcoming appointment in February for future refills.  Thank you 01/21/21  Yes Nahser, Wonda Cheng, MD  fenofibrate 160 MG tablet TAKE 1 TABLET DAILY. PLEASE KEEP UPCOMING APPT IN MARCH WITH DR. Acie Fredrickson BEFORE ANYMORE REFILLS. 01/21/21  Yes Nahser, Wonda Cheng, MD  fluticasone (FLONASE) 50 MCG/ACT nasal spray Place 2 sprays into both nostrils daily. 09/18/20  Yes Garnet Sierras, DO  ipratropium (ATROVENT) 0.03 % nasal spray Place 1-2 sprays into both nostrils every 12 (twelve) hours. For drainage 03/24/21  Yes Garnet Sierras, DO  magnesium oxide (MAG-OX) 400 MG tablet TAKE 1 TABLET BY MOUTH EVERY DAY 09/10/20  Yes Allred, Jeneen Rinks, MD  metoprolol succinate (TOPROL XL) 25 MG 24 hr tablet Take 1 tablet (25 mg total) by mouth daily. 08/04/20  Yes AllredJeneen Rinks, MD  Multiple Vitamins-Minerals (MULTIVITAMINS THER. W/MINERALS) TABS Take 1 tablet by mouth daily.   Yes [provider]  omeprazole (PRILOSEC) 20 MG capsule Take 1 capsule (20 mg total) by mouth daily. 03/24/21  Yes Garnet Sierras, DO  ondansetron (ZOFRAN) 8 MG tablet Take 1 tablet (8 mg total) by mouth every 8 (eight)  hours as needed for nausea or vomiting. Starting 3 days after chemotherapy 03/18/21  Yes Heilingoetter, Cassandra L, PA-C  potassium chloride (KLOR-CON) 10 MEQ tablet Take 1 tablet (10 mEq total) by mouth daily. 10/23/20  Yes Nahser, Wonda Cheng, MD  promethazine (PHENERGAN) 25 MG tablet Take 1 tablet (25 mg total) by mouth every 6 (six) hours as needed for nausea or vomiting. 03/25/21  Yes Curt Bears, MD  Saccharomyces boulardii (PROBIOTIC) 250 MG CAPS Take 1 capsule by mouth daily.   Yes [provider]  sucralfate (CARAFATE) 1 GM/10ML suspension Take 10 mLs (1 g total) by mouth 4 (four) times daily. Before meals 03/25/21  Yes Curt Bears, MD  tamsulosin (FLOMAX) 0.4 MG CAPS capsule Take 0.4 mg by mouth daily.   Yes [provider]  Tiotropium Bromide-Olodaterol (STIOLTO RESPIMAT) 2.5-2.5 MCG/ACT AERS Inhale 2 puffs into the lungs daily. 04/04/21  Yes Rai, Ripudeep K, MD   topiramate (TOPAMAX) 50 MG tablet Take 2 tablets (100 mg total) by mouth 2 (two) times daily. 04/13/21  Yes McCue, Janett Billow, NP  traMADol (ULTRAM) 50 MG tablet Take 1 tablet (50 mg total) by mouth every 6 (six) hours as needed for moderate pain. 02/27/21  Yes Mikhail, Velta Addison, DO  traZODone (DESYREL) 50 MG tablet Take 0.5 tablets (25 mg total) by mouth at bedtime as needed for sleep. 09/22/20  Yes McCue, Janett Billow, NP  Vitamin D, Ergocalciferol, (DRISDOL) 1.25 MG (50000 UNIT) CAPS capsule TAKE 1 CAPSULE BY MOUTH ONE TIME PER WEEK 03/19/21  Yes Brunetta Genera, MD  ALPRAZolam Duanne Moron) 0.5 MG tablet Take 0.5 mg by mouth at bedtime as needed for anxiety.    [provider]  Carbinoxamine Maleate 6 MG TABS Take 1 tablet by mouth 2 (two) times daily as needed (drainage). 03/24/21   Garnet Sierras, DO  diclofenac Sodium (VOLTAREN) 1 % GEL Apply 4 g topically 4 (four) times daily. 02/27/21   Mikhail, Velta Addison, DO  dicyclomine (BENTYL) 20 MG tablet Take 20 mg by mouth as needed for spasms.    [provider]  diltiazem (CARDIZEM) 30 MG tablet Take 1 tablet (30 mg total) by mouth 4 (four) times daily as needed. 01/21/21   Nahser, Wonda Cheng, MD  folic acid (FOLVITE) 1 MG tablet Take 1 tablet (1 mg total) by mouth daily. 03/18/21   Heilingoetter, Cassandra L, PA-C  leuprolide, 6 Month, (ELIGARD) 45 MG injection Inject 45 mg into the skin every 6 (six) months.    [provider]  lidocaine-prilocaine (EMLA) cream Apply 1 application topically as needed. 04/07/21   Heilingoetter, Cassandra L, PA-C  loperamide (IMODIUM) 2 MG capsule Take 2 mg by mouth daily as needed for diarrhea or loose stools.    [provider]  nitroGLYCERIN (NITROSTAT) 0.4 MG SL tablet Place 1 tablet (0.4 mg total) under the tongue every 5 (five) minutes as needed for chest pain. 01/21/21   Nahser, Wonda Cheng, MD  XARELTO 20 MG TABS tablet TAKE 1 TABLET BY MOUTH EVERY DAY 10/06/20   Nahser, Wonda Cheng, MD     Family  History  Problem Relation Age of Onset   Hypertension Mother    Heart disease Father    Heart attack Father    Prostate cancer Brother 63   Parkinson's disease Brother    Lung cancer Paternal Aunt        hx of smoking   Breast cancer Paternal Grandmother        dx 50s, bilateral mastectomies  Heart attack Paternal Grandfather    Blindness Son    Colon cancer Neg Hx    Pancreatic cancer Neg Hx     Social History   Socioeconomic History   Marital status: Married    Spouse name: Renee   Number of children: 3   Years of education: 14   Highest education level: Not on file  Occupational History   Occupation: Retired    Fish farm manager: DISABLED     Comment: Works at [employer] part time  Tobacco Use   Smoking status: Former    Packs/day: 1.00    Years: 34.00    Pack years: 34.00    Types: Cigarettes    Quit date: 05/05/2003    Years since quitting: 17.9   Smokeless tobacco: Never  Vaping Use   Vaping Use: Never used  Substance and Sexual Activity   Alcohol use: No    Alcohol/week: 0.0 standard drinks   Drug use: No   Sexual activity: Not Currently  Other Topics Concern   Not on file  Social History Narrative   Patient lives at home with spouse.   Caffeine Use:    Social Determinants of Health   Financial Resource Strain: Not on file  Food Insecurity: Not on file  Transportation Needs: Not on file  Physical Activity: Not on file  Stress: Not on file  Social Connections: Not on file     Review of Systems: A 12 point ROS discussed and pertinent positives are indicated in the HPI above.  All other systems are negative.  Review of Systems  Constitutional:  Negative for chills and fever.  Respiratory:  Positive for cough (chronic, dry). Negative for shortness of breath.   Cardiovascular:  Negative for chest pain.  Gastrointestinal:  Negative for abdominal pain, nausea and vomiting.  Musculoskeletal:  Negative for back pain.  Neurological:  Negative for headaches.    Vital Signs: BP (!) 147/82   Pulse 92   Temp 98.2 F (36.8 C) (Oral)   Resp 14   SpO2 100%   Physical Exam Vitals reviewed.  Constitutional:      General: He is not in acute distress. HENT:     Head: Normocephalic.     Mouth/Throat:     Mouth: Mucous membranes are moist.     Pharynx: Oropharynx is clear. No oropharyngeal exudate or posterior oropharyngeal erythema.  Cardiovascular:     Rate and Rhythm: Normal rate and regular rhythm.  Pulmonary:     Effort: Pulmonary effort is normal.     Breath sounds: Wheezing (slight) present.     Comments: (+) left pleurx Decreased breath sounds left side Abdominal:     General: There is no distension.     Palpations: Abdomen is soft.     Tenderness: There is no abdominal tenderness.  Skin:    General: Skin is warm and dry.  Neurological:     Mental Status: He is alert and oriented to person, place, and time.  Psychiatric:        Mood and Affect: Mood normal.        Behavior: Behavior normal.        Thought Content: Thought content normal.        Judgment: Judgment normal.     MD Evaluation Airway: WNL Heart: WNL Abdomen: WNL Chest/ Lungs: WNL Chest/ lungs comments: left sided pleurx ASA  Classification: 3 Mallampati/Airway Score: Two   Imaging: DG Chest 1 View  Result Date: 03/20/2021 CLINICAL DATA:  History of  stage IV lung cancer. EXAM: CHEST  1 VIEW COMPARISON:  Radiograph 03/17/2021.  CT 02/24/2021 FINDINGS: Patient's left-sided PleurX catheter is not well-defined on the current exam, faintly visualized in the mid lower thorax. Left pleural effusion and hazy opacity at the left lung base, similar to prior exam allowing for differences in technique. Chronic left lung volume loss. Implanted loop recorder projects over the left chest wall. Stable heart size and mediastinal contours with aortic atherosclerosis the right lung is clear. No pneumothorax. IMPRESSION: Left-sided PleurX catheter is not well-defined on the  current exam, faintly visualized in the mid lower thorax. Similar appearance of left pleural effusion and basilar opacity to prior exam allowing for differences in technique. No pneumothorax. Electronically Signed   By: Keith Rake M.D.   On: 03/20/2021 21:14   CT Chest Wo Contrast  Result Date: 03/21/2021 CLINICAL DATA:  Chest pain and shortness of breath EXAM: CT CHEST WITHOUT CONTRAST TECHNIQUE: Multidetector CT imaging of the chest was performed following the standard protocol without IV contrast. COMPARISON:  None. FINDINGS: Cardiovascular: Calcific aortic atherosclerosis and coronary artery atherosclerosis. Trace pericardial fluid. Heart size is normal. Mediastinum/Nodes: 13 mm preaortic lymph node. 15 mm subcarinal lymph node. No axillary adenopathy. Esophagus is unremarkable. Lungs/Pleura: Filling defects in the left lower lobe bronchial tree with associated atelectasis. Right lung is clear. Small left pleural effusion. There is a tunneled pleural catheter at the left lung base. Unchanged nodular foci in the lingula. Upper Abdomen: No acute abnormality. Musculoskeletal: No chest wall mass or suspicious bone lesions identified. IMPRESSION: 1. Filling defects in the left lower lobe bronchial tree with associated atelectasis, likely mucous plugging. 2. Small left pleural effusion with tunneled pleural catheter at the left lung base. 3. Mildly enlarged mediastinal lymph nodes, likely reactive. Aortic Atherosclerosis (ICD10-I70.0). Electronically Signed   By: Ulyses Jarred M.D.   On: 03/21/2021 01:05   CT Angio Chest Pulmonary Embolism (PE) W or WO Contrast  Result Date: 04/01/2021 CLINICAL DATA:  75 year old male with hemoptysis, recent diagnosis of lung cancer. Pulmonary embolism suspected. EXAM: CT ANGIOGRAPHY CHEST WITH CONTRAST TECHNIQUE: Multidetector CT imaging of the chest was performed using the standard protocol during bolus administration of intravenous contrast. Multiplanar CT image  reconstructions and MIPs were obtained to evaluate the vascular anatomy. CONTRAST:  100 mL Omnipaque 350, intravenous COMPARISON:  03/21/2021, 03/24/2021 FINDINGS: Cardiovascular: Satisfactory opacification of the pulmonary arteries to the segmental level. No evidence of pulmonary embolism. Normal heart size. Severe coronary atherosclerotic calcifications. Scattered thoracic aortic atherosclerotic calcifications. No pericardial effusion. Mediastinum/Nodes: Multiple prominent aortopulmonic window lymph nodes, the largest measuring up to 14 mm in short axis, similar to comparison. The thyroid gland is within normal limits. The esophagus is mildly patulous throughout. Trachea is within normal limits. No axillary or supraclavicular lymphadenopathy. Lungs/Pleura: Similar appearing central left lower lobe opacifications with associated architectural distortion, difficult to measure giving extent from the left lung base to the hilum, overall similar to recent comparisons. Similar appearing left basilar pleural thickening and multifocal subpleural left upper and lower lobe nodular opacities, the largest measuring up to 12 mm in the lingula. Interval development of element of diffuse ground-glass and reticular opacifications in the left lower lobe. No pneumothorax. The right lung is clear. Upper Abdomen: Similar appearing hypodense, poorly defined mass in the caudate lobe of the liver measuring up to approximately 3.6 cm. The remaining visualized upper abdomen is within normal limits. Musculoskeletal: Partially visualized anterior cervical fusion hardware in place without complicating features. Multilevel degenerative  changes of the thoracic spine. No acute osseous abnormality or aggressive appearing osseous lesion. Review of the MIP images confirms the above findings. IMPRESSION: Vascular: 1. No pulmonary embolism. 2. Coronary and aortic atherosclerosis (ICD10-I70.0). Non-Vascular: 1. Interval development of ground-glass  and reticular opacities in the left lower lobe as could be seen with progressive lymphangitic carcinomatosis secondary to known advanced stage left lower lobe lung mass. Superimposed pneumonia could appear similarly. 2. Overall similar appearance of poorly defined, extensive central left lower lobe mass and associated left basilar pleural thickening and scattered left upper and lower lobe pleural nodularity. 3. Similar appearing hypoattenuating caudate lobe mass and left mediastinal lymphadenopathy compatible with known metastatic disease. Ruthann Cancer, MD [facility] Electronically Signed   By: Ruthann Cancer MD   On: 04/01/2021 07:30   MR BRAIN W WO CONTRAST  Result Date: 03/19/2021 CLINICAL DATA:  Staging of non-small cell lung carcinoma EXAM: MRI HEAD WITHOUT AND WITH CONTRAST TECHNIQUE: Multiplanar, multiecho pulse sequences of the brain and surrounding structures were obtained without and with intravenous contrast. CONTRAST:  32mL GADAVIST GADOBUTROL 1 MMOL/ML IV SOLN COMPARISON:  07/06/2012 FINDINGS: Brain: No acute infarct, mass effect or extra-axial collection. No acute or chronic hemorrhage. Hyperintense T2-weighted signal is moderately widespread throughout the white matter. Generalized volume loss without a clear lobar predilection. Old left parietal infarct. The midline structures are normal. There is no abnormal contrast enhancement. Vascular: Major flow voids are preserved. Skull and upper cervical spine: Normal calvarium and skull base. Visualized upper cervical spine and soft tissues are normal. Sinuses/Orbits:No paranasal sinus fluid levels or advanced mucosal thickening. No mastoid or middle ear effusion. Normal orbits. IMPRESSION: 1. No intracranial metastatic disease. 2. 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 Electronically Signed   By: Ulyses Jarred M.D.   On: 03/19/2021 22:06   NM PET Image  Initial (PI) Skull Base To Thigh  Result Date: 03/25/2021 CLINICAL DATA:  Initial treatment strategy for lung cancer. EXAM: NUCLEAR MEDICINE PET SKULL BASE TO THIGH TECHNIQUE: 11.3 mCi F-18 FDG was injected intravenously. Full-ring PET imaging was performed from the skull base to thigh after the radiotracer. CT data was obtained and used for attenuation correction and anatomic localization. Fasting blood glucose: 96 mg/dl COMPARISON:  CT chest 03/21/2021, 02/24/2021. FINDINGS: Mediastinal blood pool activity: SUV max 2.6 Liver activity: SUV max NA NECK: No abnormal hypermetabolism. Incidental CT findings: None. CHEST: Mediastinal lymph nodes measure up to 11 mm in the AP window an SUV max 23. Subcarinal lymph nodes measure up to 1.4 cm with an SUV max 15.0. Left hilar hypermetabolism, SUV max 11.0. Prepericardiac lymph nodes measure up to 11 mm with an SUV max of 8.6. Central left lower lobe mass is difficult to measure without IV contrast but dimensions are roughly 2.8 x 3.8 cm with an SUV max 14.8. Pleural thickening and hypermetabolic pleural/subpleural nodularity in the left hemithorax. Index subpleural lingular nodule measures 11 mm (8/41) with an SUV max of 9.1. Incidental CT findings: Interstitial thickening and nodularity in the left lower lobe, worrisome for lymphangitic carcinomatosis. Partial obstruction of the left lower lobe bronchus. Atherosclerotic calcification of the aorta, aortic valve and coronary arteries. Heart is at the upper limits of normal in size to mildly enlarged. No pericardial effusion. Thin rind of pleural fluid at the base of the left hemithorax with a percutaneous drain in place. ABDOMEN/PELVIS: Hypermetabolic lesions are seen in the dome of the liver, right hepatic lobe and  caudate. Caudate mass measures 3.7 x 4.6 cm with an SUV max of 19.0. No abnormal hypermetabolism in the adrenal glands, spleen or pancreas. Hypermetabolic celiac axis lymph node measures 7 mm (4/119) with an  SUV max of 7.8. No additional hypermetabolic lymph nodes. Incidental CT findings: Liver, gallbladder, adrenal glands and right kidney are unremarkable. Subcentimeter high and low-attenuation lesions in the left kidney are too small to characterize. Spleen, pancreas, stomach and bowel are unremarkable. Atherosclerotic calcification of the aorta. SKELETON: No abnormal hypermetabolism. Incidental CT findings: Degenerative and postoperative changes in the spine. IMPRESSION: 1. Hypermetabolic left lower lobe mass with hypermetabolic intrathoracic and upper abdominal lymph nodes, left pleural/subpleural nodularity and hepatic lesions, consistent with stage IV primary bronchogenic carcinoma. 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 2. Small loculated left pleural effusion with pleural drainage catheter in place. 3.  Aortic atherosclerosis (ICD10-I70.0). Electronically Signed   By: Lorin Picket M.D.   On: 03/25/2021 10:17   DG CHEST PORT 1 VIEW  Result Date: 03/17/2021 CLINICAL DATA:  Status post PleurX catheter placement EXAM: PORTABLE CHEST 1 VIEW COMPARISON:  04/08/2021 FINDINGS: Cardiac shadow is enlarged. Loop recorder is again identified. New PleurX catheter is noted on the left. Small residual effusion remains. No pneumothorax is identified. IMPRESSION: PleurX catheter in satisfactory position. Small residual effusion is noted. No pneumothorax is seen. Electronically Signed   By: Inez Catalina M.D.   On: 03/17/2021 16:00   CUP PACEART REMOTE DEVICE CHECK  Result Date: 03/30/2021 ILR summary report received. Battery status OK. Normal device function. No new symptom, tachy, brady, or pause episodes. No new AF episodes. Monthly summary reports and ROV/PRN   Labs:  CBC: Recent Labs    04/02/21 0540 04/03/21 0459 04/07/21 1040 04/14/21 1058  WBC 4.6 4.5 1.9* 1.9*  HGB 10.6* 9.5* 9.6* 8.4*  HCT 33.6* 30.1* 29.7* 25.4*  PLT 341 284 175 53*    COAGS: Recent Labs     04/01/21 0542  INR 1.4*    BMP: Recent Labs    05/13/20 1001 06/25/20 1336 09/11/20 1038 04/03/21 0459 04/04/21 0622 04/07/21 1040 04/14/21 1058  NA 136 135   < > 134* 136 136 132*  K 4.6 4.7   < > 4.3 4.5 4.3 4.1  CL 105 103   < > 103 105 102 101  CO2 22 20   < > 23 24 26 24   GLUCOSE 129* 94   < > 142* 119* 99 123*  BUN 28* 19   < > 20 22 20 15   CALCIUM 9.7 9.7   < > 9.1 9.1 8.8* 8.9  CREATININE 1.15 1.19   < > 0.85 0.72 0.80 0.80  GFRNONAA >60 61   < > >60 >60 >60 >60  GFRAA >60 70  --   --   --   --   --    < > = values in this interval not displayed.    LIVER FUNCTION TESTS: Recent Labs    03/20/21 2100 03/31/21 1020 04/07/21 1040 04/14/21 1058  BILITOT 0.4 0.4 0.5 0.6  AST 16 14* 13* 14*  ALT 15 12 17 14   ALKPHOS 44 43 52 52  PROT 6.0* 6.4* 5.9* 5.9*  ALBUMIN 3.3* 2.9* 2.8* 2.7*    TUMOR MARKERS: No results for input(s): AFPTM, CEA, CA199, CHROMGRNA in the last 8760 hours.  Assessment and Plan:  75 y/o M with history of non-small cell lung cancer planned for continued systemic chemotherapy who presents today for port  placement for durable venous access.  Risks and benefits of image-guided Port-a-catheter placement were discussed with the patient including, but not limited to bleeding, infection, pneumothorax, or fibrin sheath development and need for additional procedures.  All of the patient's questions were answered, patient is agreeable to proceed.  Consent signed and in chart.  Thank you for this interesting consult.  I greatly enjoyed meeting FREDRIC SLABACH and look forward to participating in their care.  A copy of this report was sent to the requesting provider on this date.  Electronically Signed: Joaquim Nam, PA-C 04/16/2021, 1:09 PM   I spent a total of 30 Minutes  in face to face in clinical consultation, greater than 50% of which was counseling/coordinating care for port placement.

## 2021-04-16 NOTE — Discharge Instructions (Signed)
Urgent needs - Interventional Radiology on call MD [phone]  Wound - May remove dressing and shower in 24 to 48 hours.  Keep site clean and dry.  Replace with bandaid as needed.  Do not submerge in tub or water until site healing well. If closed with glue, glue will flake off on its own.  If ordered by your provider, may start Emla cream in 2 weeks or after incision is healed.  After completion of treatment, your provider should have you set up for monthly port flushes.   

## 2021-04-16 NOTE — Progress Notes (Signed)
Arrival time 1520

## 2021-04-16 NOTE — Procedures (Signed)
Interventional Radiology Procedure Note  Procedure: Single Lumen Power Port Placement    Access:  Right IJ vein.  Findings: Catheter tip positioned at SVC/RA junction. Port is ready for immediate use.   Complications: None  EBL: < 10 mL  Recommendations:  - Ok to shower in 24 hours - Do not submerge for 7 days - Routine line care   Signed,  Michaelle Birks, MD  Clinic: [phone]

## 2021-04-16 NOTE — Progress Notes (Signed)
Carelink Summary Report / Loop Recorder 

## 2021-04-17 MED ORDER — HYDROCOD POLST-CPM POLST ER 10-8 MG/5ML PO SUER: 5.0000 mL | Freq: Two times a day (BID) | ORAL | 0 refills | Status: DC | PRN

## 2021-04-17 NOTE — Telephone Encounter (Signed)
Pt notified of Tussenex refill via My Chart. Nothing further needed at this time.

## 2021-04-17 NOTE — Telephone Encounter (Signed)
I refilled  Meds ordered this encounter  Medications   chlorpheniramine-HYDROcodone (TUSSIONEX) 10-8 MG/5ML SUER    Sig: Take 5 mLs by mouth every 12 (twelve) hours as needed for cough.    Dispense:  473 mL    Refill:  0    Garner Nash, DO [facility] 04/17/2021 10:28 AM

## 2021-04-17 NOTE — Telephone Encounter (Signed)
Dr. Valeta Harms please advise on the following My Chart message:     My prescription for for hyrocordone [place] is getting low. I only take it at night. Can a refill script be given for this? I can come by and pickup. Thank you.. Current prescription was for 115 a 10 mi per does every 12 hours as needed for cough.  Thank you

## 2021-04-20 ENCOUNTER — Inpatient Hospital Stay: Payer: Medicare PPO

## 2021-04-20 ENCOUNTER — Inpatient Hospital Stay: Payer: Medicare PPO | Admitting: Internal Medicine

## 2021-04-20 ENCOUNTER — Other Ambulatory Visit: Payer: Self-pay | Admitting: Physician Assistant

## 2021-04-20 ENCOUNTER — Other Ambulatory Visit: Payer: Self-pay

## 2021-04-20 VITALS — BP 121/62 | HR 91 | Temp 97.2°F | Resp 20 | Ht 74.0 in | Wt 244.9 lb

## 2021-04-20 DIAGNOSIS — Z5111 Encounter for antineoplastic chemotherapy: Secondary | ICD-10-CM | POA: Diagnosis not present

## 2021-04-20 DIAGNOSIS — D649 Anemia, unspecified: Secondary | ICD-10-CM

## 2021-04-20 DIAGNOSIS — I10 Essential (primary) hypertension: Secondary | ICD-10-CM | POA: Diagnosis not present

## 2021-04-20 DIAGNOSIS — C3412 Malignant neoplasm of upper lobe, left bronchus or lung: Secondary | ICD-10-CM | POA: Diagnosis not present

## 2021-04-20 DIAGNOSIS — Z79899 Other long term (current) drug therapy: Secondary | ICD-10-CM | POA: Diagnosis not present

## 2021-04-20 DIAGNOSIS — C787 Secondary malignant neoplasm of liver and intrahepatic bile duct: Secondary | ICD-10-CM | POA: Diagnosis not present

## 2021-04-20 DIAGNOSIS — C3432 Malignant neoplasm of lower lobe, left bronchus or lung: Secondary | ICD-10-CM

## 2021-04-20 DIAGNOSIS — C3492 Malignant neoplasm of unspecified part of left bronchus or lung: Secondary | ICD-10-CM

## 2021-04-20 DIAGNOSIS — Z95828 Presence of other vascular implants and grafts: Secondary | ICD-10-CM

## 2021-04-20 DIAGNOSIS — D6481 Anemia due to antineoplastic chemotherapy: Secondary | ICD-10-CM | POA: Diagnosis not present

## 2021-04-20 DIAGNOSIS — Z5112 Encounter for antineoplastic immunotherapy: Secondary | ICD-10-CM

## 2021-04-20 DIAGNOSIS — C778 Secondary and unspecified malignant neoplasm of lymph nodes of multiple regions: Secondary | ICD-10-CM | POA: Diagnosis not present

## 2021-04-20 LAB — CMP (CANCER CENTER ONLY)
ALT: 13 U/L (ref 0–44)
AST: 18 U/L (ref 15–41)
Albumin: 2.6 g/dL — ABNORMAL LOW (ref 3.5–5.0)
Alkaline Phosphatase: 50 U/L (ref 38–126)
Anion gap: 8 (ref 5–15)
BUN: 12 mg/dL (ref 8–23)
CO2: 23 mmol/L (ref 22–32)
Calcium: 9.3 mg/dL (ref 8.9–10.3)
Chloride: 101 mmol/L (ref 98–111)
Creatinine: 0.85 mg/dL (ref 0.61–1.24)
GFR, Estimated: >60 mL/min (ref >60)
Glucose, Bld: 109 mg/dL — ABNORMAL HIGH (ref 70–99)
Potassium: 3.8 mmol/L (ref 3.5–5.1)
Sodium: 132 mmol/L — ABNORMAL LOW (ref 135–145)
Total Bilirubin: 0.4 mg/dL (ref 0.3–1.2)
Total Protein: 6.5 g/dL (ref 6.5–8.1)

## 2021-04-20 LAB — CBC WITH DIFFERENTIAL (CANCER CENTER ONLY)
Abs Immature Granulocytes: 0.13 10*3/uL — ABNORMAL HIGH (ref 0.00–0.07)
Basophils Absolute: 0 10*3/uL (ref 0.0–0.1)
Basophils Relative: 1 %
Eosinophils Absolute: 0 10*3/uL (ref 0.0–0.5)
Eosinophils Relative: 2 %
HCT: 24.1 % — ABNORMAL LOW (ref 39.0–52.0)
Hemoglobin: 8 g/dL — ABNORMAL LOW (ref 13.0–17.0)
Immature Granulocytes: 6 %
Lymphocytes Relative: 11 %
Lymphs Abs: 0.2 10*3/uL — ABNORMAL LOW (ref 0.7–4.0)
MCH: 29.6 pg (ref 26.0–34.0)
MCHC: 33.2 g/dL (ref 30.0–36.0)
MCV: 89.3 fL (ref 80.0–100.0)
Monocytes Absolute: 0.3 10*3/uL (ref 0.1–1.0)
Monocytes Relative: 14 %
Neutro Abs: 1.5 10*3/uL — ABNORMAL LOW (ref 1.7–7.7)
Neutrophils Relative %: 66 %
Platelet Count: 236 10*3/uL (ref 150–400)
RBC: 2.7 MIL/uL — ABNORMAL LOW (ref 4.22–5.81)
RDW: 16.8 % — ABNORMAL HIGH (ref 11.5–15.5)
WBC Count: 2.2 10*3/uL — ABNORMAL LOW (ref 4.0–10.5)
nRBC: 0.9 % — ABNORMAL HIGH (ref 0.0–0.2)

## 2021-04-20 LAB — SAMPLE TO BLOOD BANK

## 2021-04-20 LAB — PREPARE RBC (CROSSMATCH)

## 2021-04-20 MED ORDER — PALONOSETRON HCL INJECTION 0.25 MG/5ML
INTRAVENOUS | Status: AC
  Filled 2021-04-20: qty 5

## 2021-04-20 MED ORDER — SODIUM CHLORIDE 0.9 % IV SOLN
150.0000 mg | Freq: Once | INTRAVENOUS | Status: AC
  Administered 2021-04-20: 150 mg via INTRAVENOUS
  Filled 2021-04-20: qty 150

## 2021-04-20 MED ORDER — PALONOSETRON HCL INJECTION 0.25 MG/5ML
0.2500 mg | Freq: Once | INTRAVENOUS | Status: AC
  Administered 2021-04-20: 0.25 mg via INTRAVENOUS

## 2021-04-20 MED ORDER — SODIUM CHLORIDE 0.9 % IV SOLN
Freq: Once | INTRAVENOUS | Status: AC
  Filled 2021-04-20: qty 250

## 2021-04-20 MED ORDER — SODIUM CHLORIDE 0.9% FLUSH
10.0000 mL | INTRAVENOUS | Status: DC | PRN
  Administered 2021-04-20: 10 mL
  Filled 2021-04-20: qty 10

## 2021-04-20 MED ORDER — HEPARIN SOD (PORK) LOCK FLUSH 100 UNIT/ML IV SOLN
500.0000 [IU] | Freq: Once | INTRAVENOUS | Status: AC | PRN
  Administered 2021-04-20: 500 [IU]
  Filled 2021-04-20: qty 5

## 2021-04-20 MED ORDER — SODIUM CHLORIDE 0.9 % IV SOLN
500.0000 mg | Freq: Once | INTRAVENOUS | Status: AC
  Administered 2021-04-20: 500 mg via INTRAVENOUS
  Filled 2021-04-20: qty 50

## 2021-04-20 MED ORDER — SODIUM CHLORIDE 0.9 % IV SOLN
10.0000 mg | Freq: Once | INTRAVENOUS | Status: AC
  Administered 2021-04-20: 10 mg via INTRAVENOUS
  Filled 2021-04-20: qty 10

## 2021-04-20 MED ORDER — SODIUM CHLORIDE 0.9 % IV SOLN
200.0000 mg | Freq: Once | INTRAVENOUS | Status: AC
  Administered 2021-04-20: 200 mg via INTRAVENOUS
  Filled 2021-04-20: qty 8

## 2021-04-20 MED ORDER — SODIUM CHLORIDE 0.9% FLUSH
10.0000 mL | INTRAVENOUS | Status: AC | PRN
  Administered 2021-04-20: 10 mL
  Filled 2021-04-20: qty 10

## 2021-04-20 MED ORDER — SODIUM CHLORIDE 0.9 % IV SOLN
400.0000 mg/m2 | Freq: Once | INTRAVENOUS | Status: AC
  Administered 2021-04-20: 900 mg via INTRAVENOUS
  Filled 2021-04-20: qty 20

## 2021-04-20 NOTE — Progress Notes (Signed)
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     Molecular Studies by [employer]: [id number] 0.5%   Olaparib Yes [id number] 50.9%   Olaparib Yes [id number] 0.8% None      PDL1: 30%   PRIOR THERAPY: 1) Palliative radiotherapy to the obstructive lung mass in the left lung under the care of Dr. MTammi Klippel Last treatment 03/23/21   CURRENT THERAPY: Palliative systemic chemotherapy with carboplatin AUC 5, Alimta 500 mg per metered squared, Keytruda 200 mg IV every 3 weeks.  First dose expected on 03/31/21. Status post 1 cycle.   INTERVAL HISTORY: JLAMARION [id number].[id number]. male returns to the clinic today for follow-up visit accompanied by his wife.  The patient is complaining of increasing fatigue and weakness as well as cough and constipation.  He is currently on Tussionex and Tessalon Perles for the cough.  He also use stool softener and laxative for the constipation.  He has a small bowel movement every day.  He denied having any current chest pain but has shortness of breath with exertion with no hemoptysis.  He denied having any nausea, vomiting, abdominal pain or diarrhea.  He has no fever or chills.  He is here today for evaluation before starting cycle #2.  MEDICAL HISTORY: Past Medical History:  Diagnosis Date   Anemia    Anxiety    Arthritis    "back, right knee, hands, ankles, neck" (05/31/2016)   Carotid artery disease ([facility] 08/30/2016   Carotid [place]: R 1-39, L 40-59 // Carotid [place]: R 1-39; L 40-59 // Carotid [place]21: R 1-39; L  40-59>> repeat 1 year    Chronic lower back pain    Coronary atherosclerosis of native coronary artery    a. [facility], otherwise mild nonobstructive disease. EF normal.   Diverticulitis    Dyslipidemia    Dyspnea    Essential hypertension, benign    Family history of breast cancer    Family history of lung cancer    Family history of prostate cancer    GERD (gastroesophageal reflux disease)    Lumbar radiculopathy, chronic 02/04/2015   Right L5   Obesity    OSA on CPAP    uses cpap   Paroxysmal atrial fibrillation ([facility]    a. Discovered after stroke.   Pneumonia 01/1996   PONV (postoperative nausea and vomiting)    Prostate CA ([facility]    Recurrent upper respiratory infection (URI)    [place] ([facility] 07/2012   no deficits   Visit for monitoring Tikosyn therapy 09/18/2019    ALLERGIES:  is allergic to amoxicillin, fosinopril, hydromorphone, ampicillin, monopril [fosinopril sodium], other, and rosuvastatin.  MEDICATIONS:  Current Outpatient Medications  Medication Sig Dispense Refill   acetaminophen (TYLENOL 8 HOUR ARTHRITIS PAIN) 650 MG CR tablet Take 1,300 mg by mouth every 8 (eight) hours as needed for pain (pain).     albuterol (VENTOLIN HFA) 108 (90 Base) MCG/ACT inhaler Inhale 2 puffs into the lungs every 4 (four) hours as needed for wheezing or shortness of breath. Inhale 2 puffs into the  lungs every 4 hours as needed 18 g 0   ALPRAZolam (XANAX) 0.5 MG tablet Take 0.5 mg by mouth at bedtime as needed for anxiety.     atorvastatin (LIPITOR) 40 MG tablet Take 1 tablet (40 mg total) by mouth daily. 90 tablet 3   benzonatate (TESSALON) 200 MG capsule Take 1 capsule (200 mg total) by mouth 3 (three) times daily as needed for cough. 60 capsule 0   Carbinoxamine Maleate 6 MG TABS Take 1 tablet by mouth 2 (two) times daily as needed (drainage). 60 tablet 4   chlorpheniramine-HYDROcodone (TUSSIONEX) 10-8 MG/5ML SUER Take 5 mLs by mouth every 12 (twelve) hours as needed  for cough. 473 mL 0   diclofenac Sodium (VOLTAREN) 1 % GEL Apply 4 g topically 4 (four) times daily. 100 g 0   dicyclomine (BENTYL) 20 MG tablet Take 20 mg by mouth as needed for spasms.     diltiazem (CARDIZEM) 30 MG tablet Take 1 tablet (30 mg total) by mouth 4 (four) times daily as needed. 60 tablet 6   dofetilide (TIKOSYN) 500 MCG capsule Take 1 capsule (500 mcg total) by mouth 2 (two) times daily. Please keep upcoming appointment in February for future refills. Thank you 180 capsule 3   fenofibrate 160 MG tablet TAKE 1 TABLET DAILY. PLEASE KEEP UPCOMING APPT IN MARCH WITH DR. Acie Fredrickson BEFORE ANYMORE REFILLS. 90 tablet 3   fluticasone (FLONASE) 50 MCG/ACT nasal spray Place 2 sprays into both nostrils daily. 09.7 mL 5   folic acid (FOLVITE) 1 MG tablet Take 1 tablet (1 mg total) by mouth daily. 30 tablet 2   ipratropium (ATROVENT) 0.03 % nasal spray Place 1-2 sprays into both nostrils every 12 (twelve) hours. For drainage 30 mL 5   leuprolide, 6 Month, (ELIGARD) 45 MG injection Inject 45 mg into the skin every 6 (six) months.     lidocaine-prilocaine (EMLA) cream Apply 1 application topically as needed. 30 g 2   loperamide (IMODIUM) 2 MG capsule Take 2 mg by mouth daily as needed for diarrhea or loose stools.     magnesium oxide (MAG-OX) 400 MG tablet TAKE 1 TABLET BY MOUTH EVERY DAY 90 tablet 2   metoprolol succinate (TOPROL XL) 25 MG 24 hr tablet Take 1 tablet (25 mg total) by mouth daily. 90 tablet 3   Multiple Vitamins-Minerals (MULTIVITAMINS THER. W/MINERALS) TABS Take 1 tablet by mouth daily.     nitroGLYCERIN (NITROSTAT) 0.4 MG SL tablet Place 1 tablet (0.4 mg total) under the tongue every 5 (five) minutes as needed for chest pain. 25 tablet 5   omeprazole (PRILOSEC) 20 MG capsule Take 1 capsule (20 mg total) by mouth daily. 30 capsule 5   ondansetron (ZOFRAN) 8 MG tablet Take 1 tablet (8 mg total) by mouth every 8 (eight) hours as needed for nausea or vomiting. Starting 3 days after  chemotherapy 30 tablet 2   potassium chloride (KLOR-CON) 10 MEQ tablet Take 1 tablet (10 mEq total) by mouth daily. 90 tablet 2   promethazine (PHENERGAN) 25 MG tablet Take 1 tablet (25 mg total) by mouth every 6 (six) hours as needed for nausea or vomiting. 30 tablet 1   Saccharomyces boulardii (PROBIOTIC) 250 MG CAPS Take 1 capsule by mouth daily.     sucralfate (CARAFATE) 1 GM/10ML suspension Take 10 mLs (1 g total) by mouth 4 (four) times daily. Before meals 420 mL 1   tamsulosin (FLOMAX) 0.4 MG CAPS capsule Take 0.4 mg by mouth daily.  Tiotropium Bromide-Olodaterol (STIOLTO RESPIMAT) 2.5-2.5 MCG/ACT AERS Inhale 2 puffs into the lungs daily. 4 g 5   topiramate (TOPAMAX) 50 MG tablet Take 2 tablets (100 mg total) by mouth 2 (two) times daily. 360 tablet 3   traMADol (ULTRAM) 50 MG tablet Take 1 tablet (50 mg total) by mouth every 6 (six) hours as needed for moderate pain. 30 tablet 0   traZODone (DESYREL) 50 MG tablet Take 0.5 tablets (25 mg total) by mouth at bedtime as needed for sleep. 45 tablet 3   Vitamin D, Ergocalciferol, (DRISDOL) 1.25 MG (50000 UNIT) CAPS capsule TAKE 1 CAPSULE BY MOUTH ONE TIME PER WEEK 12 capsule 1   XARELTO 20 MG TABS tablet TAKE 1 TABLET BY MOUTH EVERY DAY 90 tablet 2   No current facility-administered medications for this visit.    SURGICAL HISTORY:  Past Surgical History:  Procedure Laterality Date   ADENOIDECTOMY     ANTERIOR CERVICAL DECOMP/DISCECTOMY FUSION  07/2001; 10/2002   "C5-6; C6-7; redo"   BACK SURGERY     CARPAL TUNNEL RELEASE Left 10/2015   CHEST TUBE INSERTION Left 03/17/2021   Procedure: INSERTION PLEURAL DRAINAGE CATHETER;  Surgeon: Garner Nash, DO;  Location: [facility];  Service: Pulmonary;  Laterality: Left;  Indwelling Tunneled Pleural catheter (PLEUREX)    COLONOSCOPY W/ POLYPECTOMY  02/2014   CORONARY ANGIOPLASTY WITH STENT PLACEMENT  05/2003; 12/2005   "mid RCA; mid RCA"   FINE NEEDLE ASPIRATION  02/26/2021   Procedure: FINE  NEEDLE ASPIRATION (FNA) LINEAR;  Surgeon: Candee Furbish, MD;  Location[facility] ENDOSCOPY;  Service: Pulmonary;;   HAND SURGERY  02/2019   LEFT HAND   implantable loop recorder placement  02/27/2020   [employer]onic Reveal [place] model [id number]  implantable loop recorder implanted by Dr Rayann Heman for afib management and evaluation of presyncope   IR IMAGING GUIDED PORT INSERTION  04/16/2021   JOINT REPLACEMENT     KNEE ARTHROSCOPY Left 10/2005   KNEE ARTHROSCOPY W/ PARTIAL MEDIAL MENISCECTOMY Left 09/2005   LUMBAR LAMINECTOMY/DECOMPRESSION MICRODISCECTOMY  03/2005   "L4-5"   POSTERIOR LUMBAR FUSION  10/2003   L5-S1; "plates, screws"   SHOULDER ARTHROSCOPY Right 08/2011   Debridement of labrum, arthroscopic distal clavicle excision   SHOULDER OPEN ROTATOR CUFF REPAIR Left 07/2014   TEE WITHOUT CARDIOVERSION  07/07/2012   Procedure: TRANSESOPHAGEAL ECHOCARDIOGRAM (TEE);  Surgeon: Fay Records, MD;  Location: [facility] ENDOSCOPY;  Service: Cardiovascular;  Laterality: N/A;   THORACENTESIS N/A 02/25/2021   Procedure: Mathews Robinsons;  Surgeon: Juanito Doom, MD;  Location: [facility] [place];  Service: Cardiopulmonary;  Laterality: N/A;   TONSILLECTOMY AND ADENOIDECTOMY  ~ 1956   TOTAL KNEE ARTHROPLASTY Left 10/2006   TRIGGER FINGER RELEASE Left 10/2015   VIDEO BRONCHOSCOPY WITH ENDOBRONCHIAL ULTRASOUND Left 02/26/2021   Procedure: VIDEO BRONCHOSCOPY WITH ENDOBRONCHIAL ULTRASOUND;  Surgeon: Candee Furbish, MD;  Location: [facility] ENDOSCOPY;  Service: Pulmonary;  Laterality: Left;  cryoprobe too thanks!    REVIEW OF SYSTEMS:  Constitutional: positive for fatigue Eyes: negative Ears, nose, mouth, throat, and face: negative Respiratory: positive for cough and dyspnea on exertion Cardiovascular: negative Gastrointestinal: positive for constipation Genitourinary:negative Integument/breast: negative Hematologic/lymphatic: negative Musculoskeletal:negative Neurological: negative Behavioral/Psych:  negative Endocrine: negative Allergic/Immunologic: negative   PHYSICAL EXAMINATION: General appearance: alert, cooperative, fatigued, and no distress Head: Normocephalic, without obvious abnormality, atraumatic Neck: no adenopathy, no JVD, supple, symmetrical, trachea midline, and thyroid not enlarged, symmetric, no tenderness/mass/nodules Lymph nodes: Cervical, supraclavicular, and axillary nodes normal. Resp: diminished breath  sounds bilaterally Back: symmetric, no curvature. ROM normal. No CVA tenderness. Cardio: regular rate and rhythm, S1, S2 normal, no murmur, click, rub or gallop GI: soft, non-tender; bowel sounds normal; no masses,  no organomegaly Extremities: extremities normal, atraumatic, no cyanosis or edema Neurologic: Alert and oriented X 3, normal strength and tone. Normal symmetric reflexes. Normal coordination and gait  ECOG PERFORMANCE STATUS: 1 - Symptomatic but completely ambulatory  Blood pressure 121/62, pulse 91, temperature (!) 97.2 F (36.2 C), temperature source Tympanic, resp. rate 20, height 6' 2"  (1.88 m), weight 244 lb 14.4 oz (111.1 kg), SpO2 96 %.  LABORATORY DATA: Lab Results  Component Value Date   WBC 1.9 (L) 04/14/2021   HGB 8.4 (L) 04/14/2021   HCT 25.4 (L) 04/14/2021   MCV 89.4 04/14/2021   PLT 53 (L) 04/14/2021      Chemistry      Component Value Date/Time   NA 132 (L) 04/14/2021 1058   NA 137 01/21/2021 1553   K 4.1 04/14/2021 1058   CL 101 04/14/2021 1058   CO2 24 04/14/2021 1058   BUN 15 04/14/2021 1058   BUN 17 01/21/2021 1553   CREATININE 0.80 04/14/2021 1058   CREATININE 1.04 08/30/2016 0840      Component Value Date/Time   CALCIUM 8.9 04/14/2021 1058   ALKPHOS 51 04/14/2021 1058   AST 14 (L) 04/14/2021 1058   ALT 14 04/14/2021 1058   BILITOT 0.6 04/14/2021 1058       RADIOGRAPHIC STUDIES: CT Angio Chest Pulmonary Embolism (PE) W or WO Contrast  Result Date: 04/01/2021 CLINICAL DATA:  75 year old male with  hemoptysis, recent diagnosis of lung cancer. Pulmonary embolism suspected. EXAM: CT ANGIOGRAPHY CHEST WITH CONTRAST TECHNIQUE: Multidetector CT imaging of the chest was performed using the standard protocol during bolus administration of intravenous contrast. Multiplanar CT image reconstructions and MIPs were obtained to evaluate the vascular anatomy. CONTRAST:  100 mL Omnipaque 350, intravenous COMPARISON:  03/21/2021, 03/24/2021 FINDINGS: Cardiovascular: Satisfactory opacification of the pulmonary arteries to the segmental level. No evidence of pulmonary embolism. Normal heart size. Severe coronary atherosclerotic calcifications. Scattered thoracic aortic atherosclerotic calcifications. No pericardial effusion. Mediastinum/Nodes: Multiple prominent aortopulmonic window lymph nodes, the largest measuring up to 14 mm in short axis, similar to comparison. The thyroid gland is within normal limits. The esophagus is mildly patulous throughout. Trachea is within normal limits. No axillary or supraclavicular lymphadenopathy. Lungs/Pleura: Similar appearing central left lower lobe opacifications with associated architectural distortion, difficult to measure giving extent from the left lung base to the hilum, overall similar to recent comparisons. Similar appearing left basilar pleural thickening and multifocal subpleural left upper and lower lobe nodular opacities, the largest measuring up to 12 mm in the lingula. Interval development of element of diffuse ground-glass and reticular opacifications in the left lower lobe. No pneumothorax. The right lung is clear. Upper Abdomen: Similar appearing hypodense, poorly defined mass in the caudate lobe of the liver measuring up to approximately 3.6 cm. The remaining visualized upper abdomen is within normal limits. Musculoskeletal: Partially visualized anterior cervical fusion hardware in place without complicating features. Multilevel degenerative changes of the thoracic spine.  No acute osseous abnormality or aggressive appearing osseous lesion. Review of the MIP images confirms the above findings. IMPRESSION: Vascular: 1. No pulmonary embolism. 2. Coronary and aortic atherosclerosis (ICD10-I70.0). Non-Vascular: 1. Interval development of ground-glass and reticular opacities in the left lower lobe as could be seen with progressive lymphangitic carcinomatosis secondary to known advanced stage left lower lobe  lung mass. Superimposed pneumonia could appear similarly. 2. Overall similar appearance of poorly defined, extensive central left lower lobe mass and associated left basilar pleural thickening and scattered left upper and lower lobe pleural nodularity. 3. Similar appearing hypoattenuating caudate lobe mass and left mediastinal lymphadenopathy compatible with known metastatic disease. Ruthann Cancer, MD [facility] Electronically Signed   By: Ruthann Cancer MD   On: 04/01/2021 07:30   NM PET Image Initial (PI) Skull Base To Thigh  Result Date: 03/25/2021 CLINICAL DATA:  Initial treatment strategy for lung cancer. EXAM: NUCLEAR MEDICINE PET SKULL BASE TO THIGH TECHNIQUE: 11.3 mCi F-18 FDG was injected intravenously. Full-ring PET imaging was performed from the skull base to thigh after the radiotracer. CT data was obtained and used for attenuation correction and anatomic localization. Fasting blood glucose: 96 mg/dl COMPARISON:  CT chest 03/21/2021, 02/24/2021. FINDINGS: Mediastinal blood pool activity: SUV max 2.6 Liver activity: SUV max NA NECK: No abnormal hypermetabolism. Incidental CT findings: None. CHEST: Mediastinal lymph nodes measure up to 11 mm in the AP window an SUV max 23. Subcarinal lymph nodes measure up to 1.4 cm with an SUV max 15.0. Left hilar hypermetabolism, SUV max 11.0. Prepericardiac lymph nodes measure up to 11 mm with an SUV max of 8.6. Central left lower lobe mass is difficult to measure without IV  contrast but dimensions are roughly 2.8 x 3.8 cm with an SUV max 14.8. Pleural thickening and hypermetabolic pleural/subpleural nodularity in the left hemithorax. Index subpleural lingular nodule measures 11 mm (8/41) with an SUV max of 9.1. Incidental CT findings: Interstitial thickening and nodularity in the left lower lobe, worrisome for lymphangitic carcinomatosis. Partial obstruction of the left lower lobe bronchus. Atherosclerotic calcification of the aorta, aortic valve and coronary arteries. Heart is at the upper limits of normal in size to mildly enlarged. No pericardial effusion. Thin rind of pleural fluid at the base of the left hemithorax with a percutaneous drain in place. ABDOMEN/PELVIS: Hypermetabolic lesions are seen in the dome of the liver, right hepatic lobe and caudate. Caudate mass measures 3.7 x 4.6 cm with an SUV max of 19.0. No abnormal hypermetabolism in the adrenal glands, spleen or pancreas. Hypermetabolic celiac axis lymph node measures 7 mm (4/119) with an SUV max of 7.8. No additional hypermetabolic lymph nodes. Incidental CT findings: Liver, gallbladder, adrenal glands and right kidney are unremarkable. Subcentimeter high and low-attenuation lesions in the left kidney are too small to characterize. Spleen, pancreas, stomach and bowel are unremarkable. Atherosclerotic calcification of the aorta. SKELETON: No abnormal hypermetabolism. Incidental CT findings: Degenerative and postoperative changes in the spine. IMPRESSION: 1. Hypermetabolic left lower lobe mass with hypermetabolic intrathoracic and upper abdominal lymph nodes, left pleural/subpleural nodularity and hepatic lesions, consistent with stage IV primary bronchogenic carcinoma. 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 2. Small loculated left pleural effusion with pleural drainage catheter in place. 3.  Aortic atherosclerosis (ICD10-I70.0). Electronically Signed   By: Lorin Picket M.D.   On: 03/25/2021  10:17   CUP PACEART REMOTE DEVICE CHECK  Result Date: 03/30/2021 ILR summary report received. Battery status OK. Normal device function. No new symptom, tachy, brady, or pause episodes. No new AF episodes. Monthly summary reports and ROV/PRN  IR IMAGING GUIDED PORT INSERTION  Result Date: 04/16/2021 INDICATION: 75 year old male with history of primary cancer of the left lower lobe. Patient is planned chemotherapy and requires venous access. EXAM: IMPLANTED PORT A CATH PLACEMENT WITH ULTRASOUND AND FLUOROSCOPIC GUIDANCE  MEDICATIONS: None ANESTHESIA/SEDATION: Moderate (conscious) sedation was employed during this procedure. A total of Versed 2 mg and Fentanyl 50 mcg was administered intravenously. Moderate Sedation Time: 38 minutes. The patient's level of consciousness and vital signs were monitored continuously by radiology nursing throughout the procedure under my direct supervision. FLUOROSCOPY TIME:  One minutes, 6 seconds (9.48 mGy) COMPLICATIONS: None immediate. PROCEDURE: The procedure, risks, benefits, and alternatives were explained to the patient. Questions regarding the procedure were encouraged and answered. The patient understands and consents to the procedure. The right neck and chest were prepped with chlorhexidine in a sterile fashion, and a sterile drape was applied covering the operative field. Maximum barrier sterile technique with sterile gowns and gloves were used for the procedure. A timeout was performed prior to the initiation of the procedure. Local anesthesia was provided with 1% lidocaine with epinephrine. After creating a small venotomy incision, a micropuncture kit was utilized to access the internal jugular vein under direct, real-time ultrasound guidance. Ultrasound image documentation was performed. The microwire was kinked to measure appropriate catheter length. A subcutaneous port pocket was then created along the upper chest wall utilizing a combination of sharp and blunt  dissection. The pocket was irrigated with sterile saline. A single lumen ISP power injectable port was chosen for placement. The 8 Fr catheter was tunneled from the port pocket site to the venotomy incision. The port was placed in the pocket. The external catheter was trimmed to appropriate length. At the venotomy, an 8 Fr peel-away sheath was placed over a guidewire under fluoroscopic guidance. The catheter was then placed through the sheath and the sheath was removed. At this point, it was felt that the port catheter was to shorten the decision was made to remove and replace the newly-placed port. The port catheter was retracted and a 0.035 inch guidewire was placed through catheter into right atrium. The port and catheter was removed, then a new 8 Fr catheter was tunneled from the port pocket site to the venotomy incision. The port was placed in the pocket. The external catheter was trimmed to appropriate length. At the venotomy, an 8 Fr peel-away sheath was placed over a guidewire under fluoroscopic guidance. The catheter was then placed through the sheath and the sheath was removed. Final catheter positioning was confirmed and documented with a fluoroscopic spot radiograph. The port was accessed with a Huber needle, aspirated and flushed with heparinized saline. The venotomy site was closed with Dermabond. The port pocket incision was closed with interrupted 3-0 Vicryl suture and Dermabond was applied. Dressings were placed. The patient tolerated the procedure well without immediate post procedural complication. IMPRESSION: Successful placement of a right internal jugular approach power injectable Port-A-Cath. The catheter is ready for immediate use. Electronically Signed   By: Michaelle Birks MD   On: 04/16/2021 15:45    ASSESSMENT AND PLAN: This is a very pleasant 75 years old white male recently diagnosed with stage IV (T1b, N2, M1c) non-small cell lung cancer favoring adenocarcinoma presented with left lung  mass in addition to left hilar adenopathy and malignant left pleural effusion, intrathoracic and upper abdominal lymph nodes, left pleural/subpleural nodularity and hepatic lesions diagnosed in May 2022. He has no actionable mutation and PD-L1 expression of 30%. He is status post palliative radiotherapy to the obstructive lung mass in the left lung under the care of Dr. Tammi Klippel.  The patient is currently undergoing systemic chemotherapy with carboplatin initially for AUC of 5, Alimta 500 Mg/M2 and Keytruda 200 Mg  IV every 3 weeks status post 1 cycle. The patient tolerated the first cycle of his treatment well except for fatigue and pancytopenia. I recommended for the patient to proceed with cycle #2 today as planned but I will reduce the dose of carboplatin to AUC of 4 and Alimta to 400 Mg/M2 starting from cycle #2.  He will continue with the same dose of Keytruda 200 mg IV every 3 weeks. For the chemotherapy-induced neutropenia, I will arrange for the patient to receive Udenyca injection 2 days after his chemotherapy. For the chemotherapy-induced anemia, I will arrange for the patient to receive 2 units of PRBCs transfusion this week. The patient will come back for follow-up visit in 3 weeks for evaluation before starting cycle #3. For the cough, I will give him refill of Tessalon Perles. For the constipation he was advised to use MiraLAX in addition to the stool softener. The patient was advised to call immediately if he has any other concerning symptoms in the interval. The patient voices understanding of current disease status and treatment options and is in agreement with the current care plan.  All questions were answered. The patient knows to call the clinic with any problems, questions or concerns. We can certainly see the patient much sooner if necessary. The total time spent in the appointment was 35 minutes.  Disclaimer: This note was dictated with voice recognition software. Similar  sounding words can inadvertently be transcribed and may not be corrected upon review.

## 2021-04-20 NOTE — Patient Instructions (Signed)
[facility]  Discharge Instructions: Thank you for choosing [facility] to provide your oncology and hematology care.   If you have a lab appointment with the [facility], please go directly to the [facility] and check in at the registration area.   Wear comfortable clothing and clothing appropriate for easy access to any Portacath or PICC line.   We strive to give you quality time with your provider. You may need to reschedule your appointment if you arrive late (15 or more minutes).  Arriving late affects you and other patients whose appointments are after yours.  Also, if you miss three or more appointments without notifying the office, you may be dismissed from the clinic at the provider's discretion.      For prescription refill requests, have your pharmacy contact our office and allow 72 hours for refills to be completed.    Today you received the following chemotherapy and/or immunotherapy agents Keytruda; Alimta; Carboplatin   To help prevent nausea and vomiting after your treatment, we encourage you to take your nausea medication as directed.  BELOW ARE SYMPTOMS THAT SHOULD BE REPORTED IMMEDIATELY: *FEVER GREATER THAN 100.4 F (38 C) OR HIGHER *CHILLS OR SWEATING *NAUSEA AND VOMITING THAT IS NOT CONTROLLED WITH YOUR NAUSEA MEDICATION *UNUSUAL SHORTNESS OF BREATH *UNUSUAL BRUISING OR BLEEDING *URINARY PROBLEMS (pain or burning when urinating, or frequent urination) *BOWEL PROBLEMS (unusual diarrhea, constipation, pain near the anus) TENDERNESS IN MOUTH AND THROAT WITH OR WITHOUT PRESENCE OF ULCERS (sore throat, sores in mouth, or a toothache) UNUSUAL RASH, SWELLING OR PAIN  UNUSUAL VAGINAL DISCHARGE OR ITCHING   Items with * indicate a potential emergency and should be followed up as soon as possible or go to the Emergency Department if any problems should occur.  Please show the CHEMOTHERAPY ALERT CARD or IMMUNOTHERAPY ALERT CARD  at check-in to the Emergency Department and triage nurse.  Should you have questions after your visit or need to cancel or reschedule your appointment, please contact [facility]  Dept: [phone]  and follow the prompts.  Office hours are 8:00 a.m. to 4:30 p.m. Monday - Friday. Please note that voicemails left after 4:00 p.m. may not be returned until the following business day.  We are closed weekends and major holidays. You have access to a nurse at all times for urgent questions. Please call the main number to the clinic Dept: [phone] and follow the prompts.   For any non-urgent questions, you may also contact your provider using MyChart. We now offer e-Visits for anyone 28 and older to request care online for non-urgent symptoms. For details visit [email or web address].   Also download the MyChart app! Go to the app store, search "MyChart", open the app, select [facility], and log in with your MyChart username and password.  Due to Covid, a mask is required upon entering the hospital/clinic. If you do not have a mask, one will be given to you upon arrival. For doctor visits, patients may have 1 support person aged 49 or older with them. For treatment visits, patients cannot have anyone with them due to current Covid guidelines and our immunocompromised population.

## 2021-04-21 ENCOUNTER — Telehealth: Payer: Self-pay

## 2021-04-21 DIAGNOSIS — D630 Anemia in neoplastic disease: Secondary | ICD-10-CM | POA: Diagnosis not present

## 2021-04-21 DIAGNOSIS — Z4801 Encounter for change or removal of surgical wound dressing: Secondary | ICD-10-CM | POA: Diagnosis not present

## 2021-04-21 DIAGNOSIS — Z48813 Encounter for surgical aftercare following surgery on the respiratory system: Secondary | ICD-10-CM | POA: Diagnosis not present

## 2021-04-21 DIAGNOSIS — C61 Malignant neoplasm of prostate: Secondary | ICD-10-CM | POA: Diagnosis not present

## 2021-04-21 DIAGNOSIS — I10 Essential (primary) hypertension: Secondary | ICD-10-CM | POA: Diagnosis not present

## 2021-04-21 DIAGNOSIS — I2510 Atherosclerotic heart disease of native coronary artery without angina pectoris: Secondary | ICD-10-CM | POA: Diagnosis not present

## 2021-04-21 DIAGNOSIS — C3492 Malignant neoplasm of unspecified part of left bronchus or lung: Secondary | ICD-10-CM

## 2021-04-21 DIAGNOSIS — C3432 Malignant neoplasm of lower lobe, left bronchus or lung: Secondary | ICD-10-CM | POA: Diagnosis not present

## 2021-04-21 DIAGNOSIS — I480 Paroxysmal atrial fibrillation: Secondary | ICD-10-CM | POA: Diagnosis not present

## 2021-04-21 DIAGNOSIS — J910 Malignant pleural effusion: Secondary | ICD-10-CM | POA: Diagnosis not present

## 2021-04-21 MED ORDER — BENZONATATE 200 MG PO CAPS: 200.0000 mg | ORAL_CAPSULE | Freq: Three times a day (TID) | ORAL | 1 refills | Status: DC | PRN

## 2021-04-21 NOTE — Telephone Encounter (Signed)
Pt called requesting a refill of Tessalon capsules to be sent to [facility] on [address].  Rx has been sent as requested.

## 2021-04-22 ENCOUNTER — Other Ambulatory Visit: Payer: Self-pay

## 2021-04-22 ENCOUNTER — Inpatient Hospital Stay: Payer: Medicare PPO

## 2021-04-22 ENCOUNTER — Telehealth: Payer: Self-pay | Admitting: Genetic Counselor

## 2021-04-22 VITALS — BP 120/71 | HR 91 | Temp 97.9°F | Resp 18

## 2021-04-22 DIAGNOSIS — C3492 Malignant neoplasm of unspecified part of left bronchus or lung: Secondary | ICD-10-CM

## 2021-04-22 DIAGNOSIS — Z5112 Encounter for antineoplastic immunotherapy: Secondary | ICD-10-CM | POA: Diagnosis not present

## 2021-04-22 DIAGNOSIS — Z5111 Encounter for antineoplastic chemotherapy: Secondary | ICD-10-CM | POA: Diagnosis not present

## 2021-04-22 DIAGNOSIS — C778 Secondary and unspecified malignant neoplasm of lymph nodes of multiple regions: Secondary | ICD-10-CM | POA: Diagnosis not present

## 2021-04-22 DIAGNOSIS — C787 Secondary malignant neoplasm of liver and intrahepatic bile duct: Secondary | ICD-10-CM | POA: Diagnosis not present

## 2021-04-22 DIAGNOSIS — C3412 Malignant neoplasm of upper lobe, left bronchus or lung: Secondary | ICD-10-CM | POA: Diagnosis not present

## 2021-04-22 DIAGNOSIS — Z79899 Other long term (current) drug therapy: Secondary | ICD-10-CM | POA: Diagnosis not present

## 2021-04-22 DIAGNOSIS — D649 Anemia, unspecified: Secondary | ICD-10-CM

## 2021-04-22 DIAGNOSIS — D6481 Anemia due to antineoplastic chemotherapy: Secondary | ICD-10-CM | POA: Diagnosis not present

## 2021-04-22 MED ORDER — PEGFILGRASTIM-CBQV 6 MG/0.6ML ~~LOC~~ SOSY
PREFILLED_SYRINGE | SUBCUTANEOUS | Status: AC
  Filled 2021-04-22: qty 0.6

## 2021-04-22 MED ORDER — DENOSUMAB 120 MG/1.7ML ~~LOC~~ SOLN
SUBCUTANEOUS | Status: AC
  Filled 2021-04-22: qty 1.7

## 2021-04-22 MED ORDER — ACETAMINOPHEN 325 MG PO TABS
ORAL_TABLET | ORAL | Status: AC
  Filled 2021-04-22: qty 2

## 2021-04-22 MED ORDER — SODIUM CHLORIDE 0.9% FLUSH
10.0000 mL | INTRAVENOUS | Status: AC | PRN
  Administered 2021-04-22: 10 mL
  Filled 2021-04-22: qty 10

## 2021-04-22 MED ORDER — DIPHENHYDRAMINE HCL 25 MG PO CAPS
25.0000 mg | ORAL_CAPSULE | Freq: Once | ORAL | Status: AC
  Administered 2021-04-22: 25 mg via ORAL

## 2021-04-22 MED ORDER — HEPARIN SOD (PORK) LOCK FLUSH 100 UNIT/ML IV SOLN
500.0000 [IU] | Freq: Every day | INTRAVENOUS | Status: AC | PRN
  Administered 2021-04-22: 500 [IU]
  Filled 2021-04-22: qty 5

## 2021-04-22 MED ORDER — ACETAMINOPHEN 325 MG PO TABS
650.0000 mg | ORAL_TABLET | Freq: Once | ORAL | Status: AC
  Administered 2021-04-22: 650 mg via ORAL

## 2021-04-22 MED ORDER — DIPHENHYDRAMINE HCL 25 MG PO CAPS
ORAL_CAPSULE | ORAL | Status: AC
  Filled 2021-04-22: qty 1

## 2021-04-22 MED ORDER — SODIUM CHLORIDE 0.9% IV SOLUTION
250.0000 mL | Freq: Once | INTRAVENOUS | Status: AC
  Administered 2021-04-22: 250 mL via INTRAVENOUS
  Filled 2021-04-22: qty 250

## 2021-04-22 MED ORDER — PEGFILGRASTIM-CBQV 6 MG/0.6ML ~~LOC~~ SOSY
6.0000 mg | PREFILLED_SYRINGE | Freq: Once | SUBCUTANEOUS | Status: AC
  Administered 2021-04-22: 6 mg via SUBCUTANEOUS

## 2021-04-22 NOTE — Patient Instructions (Addendum)
Blood Transfusion, Adult °A blood transfusion is a procedure in which you receive blood through an IV tube. You may need this procedure because of: °A bleeding disorder. °An illness. °An injury. °A surgery. °The blood may come from someone else (a donor). You may also be able to donate blood for yourself. The blood given in a transfusion is made up of different types of cells. You may get: °Red blood cells. These carry oxygen to the cells in the body. °White blood cells. These help you fight infections. °Platelets. These help your blood to clot. °Plasma. This is the liquid part of your blood. It carries proteins and other substances through the body. °If you have a clotting disorder, you may also get other types of blood products. °Tell your doctor about: °Any blood disorders you have. °Any reactions you have had during a blood transfusion in the past. °Any allergies you have. °All medicines you are taking, including vitamins, herbs, eye drops, creams, and over-the-counter medicines. °Any surgeries you have had. °Any medical conditions you have. This includes any recent fever or cold symptoms. °Whether you are pregnant or may be pregnant. °What are the risks? °Generally, this is a safe procedure. However, problems may occur. °The most common problems include: °A mild allergic reaction. This includes red, swollen areas of skin (hives) and itching. °Fever or chills. This may be the body's response to new blood cells received. This may happen during or up to 4 hours after the transfusion. °More serious problems may include: °Too much fluid in the lungs. This may cause breathing problems. °A serious allergic reaction. This includes breathing trouble or swelling around the face and lips. °Lung injury. This causes breathing trouble and low oxygen in the blood. This can happen within hours of the transfusion or days later. °Too much iron. This can happen after getting many blood transfusions over a period of time. °An  infection or virus passed through the blood. This is rare. Donated blood is carefully tested before it is given. °Your body's defense system (immune system) trying to attack the new blood cells. This is rare. Symptoms may include fever, chills, nausea, low blood pressure, and low back or chest pain. °Donated cells attacking healthy tissues. This is rare. °What happens before the procedure? °Medicines °Ask your doctor about: °Changing or stopping your normal medicines. This is important. °Taking aspirin and ibuprofen. Do not take these medicines unless your doctor tells you to take them. °Taking over-the-counter medicines, vitamins, herbs, and supplements. °General instructions °Follow instructions from your doctor about what you cannot eat or drink. °You will have a blood test to find out your blood type. The test also finds out what type of blood your body will accept and matches it to the donor type. °If you are going to have a planned surgery, you may be able to donate your own blood. This may be done in case you need a transfusion. °You will have your temperature, blood pressure, and pulse checked. °You may receive medicine to help prevent an allergic reaction. This may be done if you have had a reaction to a transfusion before. This medicine may be given to you by mouth or through an IV tube. °This procedure lasts about 1-4 hours. Plan for the time you need. °What happens during the procedure? ° °An IV tube will be put into one of your veins. °The bag of donated blood will be attached to your IV tube. Then, the blood will enter through your vein. °Your temperature,   blood pressure, and pulse will be checked often. This is done to find early signs of a transfusion reaction. °Tell your nurse right away if you have any of these symptoms: °Shortness of breath or trouble breathing. °Chest or back pain. °Fever or chills. °Red, swollen areas of skin or itching. °If you have any signs or symptoms of a reaction, your  transfusion will be stopped. You may also be given medicine. °When the transfusion is finished, your IV tube will be taken out. °Pressure may be put on the IV site for a few minutes. °A bandage (dressing) will be put on the IV site. °The procedure may vary among doctors and hospitals. °What happens after the procedure? °You will be monitored until you leave the hospital or clinic. This includes checking your temperature, blood pressure, pulse, breathing rate, and blood oxygen level. °Your blood may be tested to see how you are responding to the transfusion. °You may be warmed with fluids or blankets. This is done to keep the temperature of your body normal. °If you have your procedure in an outpatient setting, you will be told whom to contact to report any reactions. °Where to find more information °To learn more, visit the [employer]: redcross.org °Summary °A blood transfusion is a procedure in which you are given blood through an IV tube. °The blood may come from someone else (a donor). You may also be able to donate blood for yourself. °The blood you are given is made up of different blood cells. You may receive red blood cells, platelets, plasma, or white blood cells. °Your temperature, blood pressure, and pulse will be checked often. °After the procedure, your blood may be tested to see how you are responding. °This information is not intended to replace advice given to you by your health care provider. Make sure you discuss any questions you have with your health care provider. °Document Revised: 03/15/2019 Document Reviewed: 03/15/2019 °Elsevier Patient Education © 2022 [employer] Inc. ° °

## 2021-04-22 NOTE — Telephone Encounter (Signed)
LVM that I am following up to see if he is still interested in setting up an appointment to discuss his genetic test results in more detail with his family members. Requested that he call back to discuss.

## 2021-04-23 ENCOUNTER — Telehealth: Payer: Self-pay | Admitting: Internal Medicine

## 2021-04-23 LAB — BPAM RBC
Blood Product Expiration Date: 202208192359
Blood Product Expiration Date: 202208192359
ISSUE DATE / TIME: 202207201024
ISSUE DATE / TIME: 202207201024
Unit Type and Rh: 5100
Unit Type and Rh: 5100

## 2021-04-23 LAB — TYPE AND SCREEN
ABO/RH(D): O POS
Antibody Screen: NEGATIVE
Unit division: 0
Unit division: 0

## 2021-04-23 NOTE — Telephone Encounter (Signed)
Scheduled per los. Called, not able to leave msg. Mailed printout  

## 2021-04-24 ENCOUNTER — Inpatient Hospital Stay: Payer: Medicare PPO

## 2021-04-24 ENCOUNTER — Other Ambulatory Visit: Payer: Self-pay

## 2021-04-24 ENCOUNTER — Encounter: Payer: Self-pay | Admitting: *Deleted

## 2021-04-24 ENCOUNTER — Other Ambulatory Visit: Payer: Self-pay | Admitting: Hematology & Oncology

## 2021-04-24 ENCOUNTER — Other Ambulatory Visit: Payer: Self-pay | Admitting: *Deleted

## 2021-04-24 DIAGNOSIS — C3492 Malignant neoplasm of unspecified part of left bronchus or lung: Secondary | ICD-10-CM

## 2021-04-24 DIAGNOSIS — C787 Secondary malignant neoplasm of liver and intrahepatic bile duct: Secondary | ICD-10-CM | POA: Diagnosis not present

## 2021-04-24 DIAGNOSIS — D649 Anemia, unspecified: Secondary | ICD-10-CM

## 2021-04-24 DIAGNOSIS — Z5112 Encounter for antineoplastic immunotherapy: Secondary | ICD-10-CM | POA: Diagnosis not present

## 2021-04-24 DIAGNOSIS — D6481 Anemia due to antineoplastic chemotherapy: Secondary | ICD-10-CM | POA: Diagnosis not present

## 2021-04-24 DIAGNOSIS — C3412 Malignant neoplasm of upper lobe, left bronchus or lung: Secondary | ICD-10-CM | POA: Diagnosis not present

## 2021-04-24 DIAGNOSIS — C61 Malignant neoplasm of prostate: Secondary | ICD-10-CM

## 2021-04-24 DIAGNOSIS — C778 Secondary and unspecified malignant neoplasm of lymph nodes of multiple regions: Secondary | ICD-10-CM | POA: Diagnosis not present

## 2021-04-24 DIAGNOSIS — Z5111 Encounter for antineoplastic chemotherapy: Secondary | ICD-10-CM | POA: Diagnosis not present

## 2021-04-24 DIAGNOSIS — Z79899 Other long term (current) drug therapy: Secondary | ICD-10-CM | POA: Diagnosis not present

## 2021-04-24 DIAGNOSIS — Z95828 Presence of other vascular implants and grafts: Secondary | ICD-10-CM

## 2021-04-24 LAB — CBC WITH DIFFERENTIAL (CANCER CENTER ONLY)
Abs Immature Granulocytes: 0.1 10*3/uL — ABNORMAL HIGH (ref 0.00–0.07)
Band Neutrophils: 10 %
Basophils Absolute: 0 10*3/uL (ref 0.0–0.1)
Basophils Relative: 0 %
Eosinophils Absolute: 0.1 10*3/uL (ref 0.0–0.5)
Eosinophils Relative: 5 %
HCT: 31.6 % — ABNORMAL LOW (ref 39.0–52.0)
Hemoglobin: 10.4 g/dL — ABNORMAL LOW (ref 13.0–17.0)
Lymphocytes Relative: 1 %
Lymphs Abs: 0 10*3/uL — ABNORMAL LOW (ref 0.7–4.0)
MCH: 29.1 pg (ref 26.0–34.0)
MCHC: 32.9 g/dL (ref 30.0–36.0)
MCV: 88.3 fL (ref 80.0–100.0)
Metamyelocytes Relative: 2 %
Monocytes Absolute: 0.1 10*3/uL (ref 0.1–1.0)
Monocytes Relative: 5 %
Neutro Abs: 2.5 10*3/uL (ref 1.7–7.7)
Neutrophils Relative %: 77 %
Platelet Count: 274 10*3/uL (ref 150–400)
RBC: 3.58 MIL/uL — ABNORMAL LOW (ref 4.22–5.81)
RDW: 16 % — ABNORMAL HIGH (ref 11.5–15.5)
WBC Count: 2.9 10*3/uL — ABNORMAL LOW (ref 4.0–10.5)
nRBC: 0.7 % — ABNORMAL HIGH (ref 0.0–0.2)

## 2021-04-24 LAB — CMP (CANCER CENTER ONLY)
ALT: 15 U/L (ref 0–44)
AST: 19 U/L (ref 15–41)
Albumin: 2.7 g/dL — ABNORMAL LOW (ref 3.5–5.0)
Alkaline Phosphatase: 63 U/L (ref 38–126)
Anion gap: 9 (ref 5–15)
BUN: 17 mg/dL (ref 8–23)
CO2: 24 mmol/L (ref 22–32)
Calcium: 8.9 mg/dL (ref 8.9–10.3)
Chloride: 103 mmol/L (ref 98–111)
Creatinine: 0.77 mg/dL (ref 0.61–1.24)
GFR, Estimated: >60 mL/min (ref >60)
Glucose, Bld: 113 mg/dL — ABNORMAL HIGH (ref 70–99)
Potassium: 3.9 mmol/L (ref 3.5–5.1)
Sodium: 136 mmol/L (ref 135–145)
Total Bilirubin: 0.5 mg/dL (ref 0.3–1.2)
Total Protein: 6.5 g/dL (ref 6.5–8.1)

## 2021-04-24 MED ORDER — SODIUM CHLORIDE 0.9% FLUSH
10.0000 mL | INTRAVENOUS | Status: DC | PRN
  Administered 2021-04-24: 10 mL via INTRAVENOUS
  Filled 2021-04-24: qty 10

## 2021-04-24 MED ORDER — HEPARIN SOD (PORK) LOCK FLUSH 100 UNIT/ML IV SOLN
500.0000 [IU] | Freq: Once | INTRAVENOUS | Status: AC
  Administered 2021-04-24: 500 [IU] via INTRAVENOUS
  Filled 2021-04-24: qty 5

## 2021-04-24 NOTE — Progress Notes (Signed)
Pt c/o increased fatigue, recent chemo and blood transfusion.  Pt advised to come to [facility] for labs.  Hgb 10.4  no transfusion indicated.  SW pt in lobby.  Assured pt that the chemo was likely causing fatigue.  Advised pt to call provider on Monday if he was still feeling unwell or present to ED over the weekend it other issues occurred.  Pt verbalized understanding.

## 2021-04-27 ENCOUNTER — Inpatient Hospital Stay: Payer: Medicare PPO

## 2021-04-27 ENCOUNTER — Other Ambulatory Visit: Payer: Medicare PPO

## 2021-04-27 ENCOUNTER — Other Ambulatory Visit: Payer: Self-pay

## 2021-04-27 DIAGNOSIS — Z5112 Encounter for antineoplastic immunotherapy: Secondary | ICD-10-CM | POA: Diagnosis not present

## 2021-04-27 DIAGNOSIS — D649 Anemia, unspecified: Secondary | ICD-10-CM

## 2021-04-27 DIAGNOSIS — C3412 Malignant neoplasm of upper lobe, left bronchus or lung: Secondary | ICD-10-CM | POA: Diagnosis not present

## 2021-04-27 DIAGNOSIS — C787 Secondary malignant neoplasm of liver and intrahepatic bile duct: Secondary | ICD-10-CM | POA: Diagnosis not present

## 2021-04-27 DIAGNOSIS — Z79899 Other long term (current) drug therapy: Secondary | ICD-10-CM | POA: Diagnosis not present

## 2021-04-27 DIAGNOSIS — C3492 Malignant neoplasm of unspecified part of left bronchus or lung: Secondary | ICD-10-CM

## 2021-04-27 DIAGNOSIS — Z95828 Presence of other vascular implants and grafts: Secondary | ICD-10-CM

## 2021-04-27 DIAGNOSIS — C778 Secondary and unspecified malignant neoplasm of lymph nodes of multiple regions: Secondary | ICD-10-CM | POA: Diagnosis not present

## 2021-04-27 DIAGNOSIS — Z5111 Encounter for antineoplastic chemotherapy: Secondary | ICD-10-CM | POA: Diagnosis not present

## 2021-04-27 DIAGNOSIS — D6481 Anemia due to antineoplastic chemotherapy: Secondary | ICD-10-CM | POA: Diagnosis not present

## 2021-04-27 LAB — CMP (CANCER CENTER ONLY)
ALT: 15 U/L (ref 0–44)
AST: 19 U/L (ref 15–41)
Albumin: 2.6 g/dL — ABNORMAL LOW (ref 3.5–5.0)
Alkaline Phosphatase: 77 U/L (ref 38–126)
Anion gap: 7 (ref 5–15)
BUN: 16 mg/dL (ref 8–23)
CO2: 24 mmol/L (ref 22–32)
Calcium: 8.9 mg/dL (ref 8.9–10.3)
Chloride: 98 mmol/L (ref 98–111)
Creatinine: 0.79 mg/dL (ref 0.61–1.24)
GFR, Estimated: >60 mL/min (ref >60)
Glucose, Bld: 125 mg/dL — ABNORMAL HIGH (ref 70–99)
Potassium: 3.9 mmol/L (ref 3.5–5.1)
Sodium: 129 mmol/L — ABNORMAL LOW (ref 135–145)
Total Bilirubin: 0.5 mg/dL (ref 0.3–1.2)
Total Protein: 6.3 g/dL — ABNORMAL LOW (ref 6.5–8.1)

## 2021-04-27 LAB — CBC WITH DIFFERENTIAL (CANCER CENTER ONLY)
Abs Immature Granulocytes: 0.2 10*3/uL — ABNORMAL HIGH (ref 0.00–0.07)
Basophils Absolute: 0 10*3/uL (ref 0.0–0.1)
Basophils Relative: 1 %
Eosinophils Absolute: 0 10*3/uL (ref 0.0–0.5)
Eosinophils Relative: 0 %
HCT: 27.9 % — ABNORMAL LOW (ref 39.0–52.0)
Hemoglobin: 9.5 g/dL — ABNORMAL LOW (ref 13.0–17.0)
Immature Granulocytes: 3 %
Lymphocytes Relative: 3 %
Lymphs Abs: 0.2 10*3/uL — ABNORMAL LOW (ref 0.7–4.0)
MCH: 29.8 pg (ref 26.0–34.0)
MCHC: 34.1 g/dL (ref 30.0–36.0)
MCV: 87.5 fL (ref 80.0–100.0)
Monocytes Absolute: 0.9 10*3/uL (ref 0.1–1.0)
Monocytes Relative: 11 %
Neutro Abs: 6.8 10*3/uL (ref 1.7–7.7)
Neutrophils Relative %: 82 %
Platelet Count: 134 10*3/uL — ABNORMAL LOW (ref 150–400)
RBC: 3.19 MIL/uL — ABNORMAL LOW (ref 4.22–5.81)
RDW: 15.7 % — ABNORMAL HIGH (ref 11.5–15.5)
WBC Count: 8.2 10*3/uL (ref 4.0–10.5)
nRBC: 0 % (ref 0.0–0.2)

## 2021-04-27 LAB — SAMPLE TO BLOOD BANK

## 2021-04-27 LAB — TSH: TSH: 1.444 u[IU]/mL (ref 0.320–4.118)

## 2021-04-27 MED ORDER — HEPARIN SOD (PORK) LOCK FLUSH 100 UNIT/ML IV SOLN
500.0000 [IU] | Freq: Once | INTRAVENOUS | Status: AC
  Administered 2021-04-27: 500 [IU]
  Filled 2021-04-27: qty 5

## 2021-04-27 MED ORDER — SODIUM CHLORIDE 0.9% FLUSH
10.0000 mL | Freq: Once | INTRAVENOUS | Status: AC
Start: 2021-04-27 — End: 2021-04-27
  Administered 2021-04-27: 10 mL
  Filled 2021-04-27: qty 10

## 2021-04-30 DIAGNOSIS — C61 Malignant neoplasm of prostate: Secondary | ICD-10-CM | POA: Diagnosis not present

## 2021-04-30 DIAGNOSIS — J910 Malignant pleural effusion: Secondary | ICD-10-CM | POA: Diagnosis not present

## 2021-04-30 DIAGNOSIS — J441 Chronic obstructive pulmonary disease with (acute) exacerbation: Secondary | ICD-10-CM | POA: Diagnosis not present

## 2021-04-30 DIAGNOSIS — J189 Pneumonia, unspecified organism: Secondary | ICD-10-CM | POA: Diagnosis not present

## 2021-04-30 DIAGNOSIS — Z4801 Encounter for change or removal of surgical wound dressing: Secondary | ICD-10-CM | POA: Diagnosis not present

## 2021-04-30 DIAGNOSIS — J440 Chronic obstructive pulmonary disease with acute lower respiratory infection: Secondary | ICD-10-CM | POA: Diagnosis not present

## 2021-04-30 DIAGNOSIS — C3432 Malignant neoplasm of lower lobe, left bronchus or lung: Secondary | ICD-10-CM | POA: Diagnosis not present

## 2021-04-30 DIAGNOSIS — D630 Anemia in neoplastic disease: Secondary | ICD-10-CM | POA: Diagnosis not present

## 2021-04-30 DIAGNOSIS — Z48813 Encounter for surgical aftercare following surgery on the respiratory system: Secondary | ICD-10-CM | POA: Diagnosis not present

## 2021-05-02 LAB — CUP PACEART REMOTE DEVICE CHECK
Date Time Interrogation Session: 20220729101325
Implantable Pulse Generator Implant Date: 20210526

## 2021-05-04 ENCOUNTER — Other Ambulatory Visit: Payer: Self-pay

## 2021-05-04 ENCOUNTER — Other Ambulatory Visit: Payer: Medicare PPO

## 2021-05-04 ENCOUNTER — Other Ambulatory Visit: Payer: Self-pay | Admitting: Physician Assistant

## 2021-05-04 ENCOUNTER — Ambulatory Visit (INDEPENDENT_AMBULATORY_CARE_PROVIDER_SITE_OTHER): Payer: Medicare PPO

## 2021-05-04 ENCOUNTER — Inpatient Hospital Stay: Payer: Medicare PPO | Attending: Physician Assistant

## 2021-05-04 DIAGNOSIS — D6481 Anemia due to antineoplastic chemotherapy: Secondary | ICD-10-CM | POA: Insufficient documentation

## 2021-05-04 DIAGNOSIS — Z95828 Presence of other vascular implants and grafts: Secondary | ICD-10-CM

## 2021-05-04 DIAGNOSIS — C787 Secondary malignant neoplasm of liver and intrahepatic bile duct: Secondary | ICD-10-CM | POA: Diagnosis not present

## 2021-05-04 DIAGNOSIS — R55 Syncope and collapse: Secondary | ICD-10-CM | POA: Diagnosis not present

## 2021-05-04 DIAGNOSIS — C3492 Malignant neoplasm of unspecified part of left bronchus or lung: Secondary | ICD-10-CM

## 2021-05-04 DIAGNOSIS — Z5111 Encounter for antineoplastic chemotherapy: Secondary | ICD-10-CM | POA: Insufficient documentation

## 2021-05-04 DIAGNOSIS — Z5112 Encounter for antineoplastic immunotherapy: Secondary | ICD-10-CM | POA: Insufficient documentation

## 2021-05-04 DIAGNOSIS — J910 Malignant pleural effusion: Secondary | ICD-10-CM | POA: Diagnosis not present

## 2021-05-04 DIAGNOSIS — Z79899 Other long term (current) drug therapy: Secondary | ICD-10-CM | POA: Diagnosis not present

## 2021-05-04 DIAGNOSIS — C3412 Malignant neoplasm of upper lobe, left bronchus or lung: Secondary | ICD-10-CM | POA: Diagnosis not present

## 2021-05-04 DIAGNOSIS — C778 Secondary and unspecified malignant neoplasm of lymph nodes of multiple regions: Secondary | ICD-10-CM | POA: Insufficient documentation

## 2021-05-04 DIAGNOSIS — D649 Anemia, unspecified: Secondary | ICD-10-CM

## 2021-05-04 LAB — CMP (CANCER CENTER ONLY)
ALT: 14 U/L (ref 0–44)
AST: 18 U/L (ref 15–41)
Albumin: 2.7 g/dL — ABNORMAL LOW (ref 3.5–5.0)
Alkaline Phosphatase: 77 U/L (ref 38–126)
Anion gap: 7 (ref 5–15)
BUN: 12 mg/dL (ref 8–23)
CO2: 25 mmol/L (ref 22–32)
Calcium: 9.3 mg/dL (ref 8.9–10.3)
Chloride: 101 mmol/L (ref 98–111)
Creatinine: 0.83 mg/dL (ref 0.61–1.24)
GFR, Estimated: >60 mL/min (ref >60)
Glucose, Bld: 109 mg/dL — ABNORMAL HIGH (ref 70–99)
Potassium: 4.2 mmol/L (ref 3.5–5.1)
Sodium: 133 mmol/L — ABNORMAL LOW (ref 135–145)
Total Bilirubin: 0.4 mg/dL (ref 0.3–1.2)
Total Protein: 6.3 g/dL — ABNORMAL LOW (ref 6.5–8.1)

## 2021-05-04 LAB — CBC WITH DIFFERENTIAL (CANCER CENTER ONLY)
Abs Immature Granulocytes: 3.29 10*3/uL — ABNORMAL HIGH (ref 0.00–0.07)
Basophils Absolute: 0.1 10*3/uL (ref 0.0–0.1)
Basophils Relative: 1 %
Eosinophils Absolute: 0 10*3/uL (ref 0.0–0.5)
Eosinophils Relative: 0 %
HCT: 26.5 % — ABNORMAL LOW (ref 39.0–52.0)
Hemoglobin: 8.7 g/dL — ABNORMAL LOW (ref 13.0–17.0)
Immature Granulocytes: 23 %
Lymphocytes Relative: 3 %
Lymphs Abs: 0.4 10*3/uL — ABNORMAL LOW (ref 0.7–4.0)
MCH: 29.2 pg (ref 26.0–34.0)
MCHC: 32.8 g/dL (ref 30.0–36.0)
MCV: 88.9 fL (ref 80.0–100.0)
Monocytes Absolute: 1.3 10*3/uL — ABNORMAL HIGH (ref 0.1–1.0)
Monocytes Relative: 9 %
Neutro Abs: 9.3 10*3/uL — ABNORMAL HIGH (ref 1.7–7.7)
Neutrophils Relative %: 64 %
Platelet Count: 158 10*3/uL (ref 150–400)
RBC: 2.98 MIL/uL — ABNORMAL LOW (ref 4.22–5.81)
RDW: 16.4 % — ABNORMAL HIGH (ref 11.5–15.5)
Smear Review: NORMAL
WBC Count: 14.4 10*3/uL — ABNORMAL HIGH (ref 4.0–10.5)
WBC Morphology: INCREASED
nRBC: 0.8 % — ABNORMAL HIGH (ref 0.0–0.2)

## 2021-05-04 LAB — SAMPLE TO BLOOD BANK

## 2021-05-04 MED ORDER — HEPARIN SOD (PORK) LOCK FLUSH 100 UNIT/ML IV SOLN
500.0000 [IU] | Freq: Once | INTRAVENOUS | Status: AC
  Administered 2021-05-04: 500 [IU]
  Filled 2021-05-04: qty 5

## 2021-05-04 MED ORDER — SODIUM CHLORIDE 0.9% FLUSH
10.0000 mL | Freq: Once | INTRAVENOUS | Status: AC
  Administered 2021-05-04: 10 mL
  Filled 2021-05-04: qty 10

## 2021-05-05 ENCOUNTER — Encounter: Payer: Self-pay | Admitting: Urology

## 2021-05-05 NOTE — Progress Notes (Signed)
Patient due for follow up appointment after receiving radiation treatment to prostate. Patient completed treatment on 03/23/21. Meaningful use questions complete, made patient aware that follow up appointment to be via telephone. Patient voiced understanding. Patient reports incomplete bladder emptying,  and itermittency less that 1 in 5 times.Patient denies straining to start urination. Patient reports having urinary urgency and frequency less than half the time. Patient reports having a weak urine stream. States he is getting up 3-8 times per night to urinate,stating the nocturia is really disturbing his sleep pattern. Patient does not have a follow up appointment with    urologist at this time. Patients IPSS score is 14.

## 2021-05-06 ENCOUNTER — Ambulatory Visit
Admission: RE | Admit: 2021-05-06 | Discharge: 2021-05-06 | Disposition: A | Discharge status: Home / Self Care | Payer: Medicare PPO | Source: Ambulatory Visit | Attending: Urology | Admitting: Urology

## 2021-05-06 ENCOUNTER — Other Ambulatory Visit: Payer: Self-pay

## 2021-05-06 DIAGNOSIS — C3492 Malignant neoplasm of unspecified part of left bronchus or lung: Secondary | ICD-10-CM

## 2021-05-06 DIAGNOSIS — G4733 Obstructive sleep apnea (adult) (pediatric): Secondary | ICD-10-CM | POA: Diagnosis not present

## 2021-05-06 NOTE — Progress Notes (Signed)
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  Interval Since Last Radiation:  6 weeks  03/10/21 - 03/23/21: The obstructing mass in the LLL was treated to 30 Gy in 10 fractions.  05/27/20 - 07/25/20; concurrent with ADT (started 03/11/20) 1. The prostate, seminal vesicles, and pelvic lymph nodes were initially treated to 45 Gy in 25 fractions of 1.8 Gy 2. The prostate only was boosted to 75 Gy with 15 additional fractions of 2.0 Gy   Narrative:  I spoke with the patient to conduct his routine scheduled 1 month follow up visit via telephone to spare the patient unnecessary potential exposure in the healthcare setting during the current COVID-19 pandemic.  The patient was notified in advance and gave permission to proceed with this visit format.  He tolerated radiation treatment relatively well without any ill side effects.                              On review of systems, the patient states that he is doing well in general.  He specifically denies increased shortness of breath, productive cough, hemoptysis, dysphagia, fever, chills or night sweats.  He has recently started on palliative systemic therapy with carboplatin, Alimta and Keytruda with his first dose on 03/31/2021 and completed his second cycle on 04/20/2021.  His next cycle is scheduled for 05/11/2021 and so far, he is tolerating this relatively well despite significant fatigue and generalized weakness.  He also reports dry mouth which has become more persistent over the last 1 to 2 weeks.  He reports a healthy appetite and is maintaining his weight.  He denies abdominal pain, nausea, vomiting, diarrhea or constipation.  He reports that he is getting  adequate fluids and overall, is pleased with his progress to date.  ALLERGIES:  is allergic to amoxicillin, fosinopril, hydromorphone, ampicillin, monopril [fosinopril sodium], other, and rosuvastatin.  Meds: Current Outpatient Medications  Medication Sig Dispense Refill   acetaminophen (TYLENOL 8 HOUR ARTHRITIS PAIN) 650 MG CR tablet Take 1,300 mg by mouth every 8 (eight) hours as needed for pain (pain).     albuterol (VENTOLIN HFA) 108 (90 Base) MCG/ACT inhaler Inhale 2 puffs into the lungs every 4 (four) hours as needed for wheezing or shortness of breath. Inhale 2 puffs into the lungs every 4 hours as needed 18 g 0   ALPRAZolam (XANAX) 0.5 MG tablet Take 0.5 mg by mouth at bedtime as needed for anxiety.     atorvastatin (LIPITOR) 40 MG tablet Take 1 tablet (40 mg total) by mouth daily. 90 tablet 3   benzonatate (TESSALON) 200 MG capsule Take 1 capsule (200 mg total) by mouth 3 (three) times daily as needed for cough. 60 capsule 1   Carbinoxamine Maleate 6 MG TABS Take 1 tablet by mouth 2 (two) times daily as needed (drainage). 60 tablet 4   chlorpheniramine-HYDROcodone (TUSSIONEX) 10-8 MG/5ML SUER Take 5 mLs by mouth every 12 (twelve) hours as needed for cough. 473 mL 0   diclofenac Sodium (VOLTAREN) 1 % GEL Apply 4 g topically 4 (four) times daily. 100 g 0   dicyclomine (BENTYL) 20 MG tablet  Take 20 mg by mouth as needed for spasms.     diltiazem (CARDIZEM) 30 MG tablet Take 1 tablet (30 mg total) by mouth 4 (four) times daily as needed. 60 tablet 6   dofetilide (TIKOSYN) 500 MCG capsule Take 1 capsule (500 mcg total) by mouth 2 (two) times daily. Please keep upcoming appointment in February for future refills. Thank you 180 capsule 3   fenofibrate 160 MG tablet TAKE 1 TABLET DAILY. PLEASE KEEP UPCOMING APPT IN MARCH WITH DR. Acie Fredrickson BEFORE ANYMORE REFILLS. 90 tablet 3   fluticasone (FLONASE) 50 MCG/ACT nasal spray Place 2 sprays into both nostrils daily. 62.4 mL 5   folic acid (FOLVITE) 1 MG  tablet Take 1 tablet (1 mg total) by mouth daily. 30 tablet 2   ipratropium (ATROVENT) 0.03 % nasal spray Place 1-2 sprays into both nostrils every 12 (twelve) hours. For drainage 30 mL 5   leuprolide, 6 Month, (ELIGARD) 45 MG injection Inject 45 mg into the skin every 6 (six) months.     lidocaine-prilocaine (EMLA) cream Apply 1 application topically as needed. 30 g 2   loperamide (IMODIUM) 2 MG capsule Take 2 mg by mouth daily as needed for diarrhea or loose stools.     magnesium oxide (MAG-OX) 400 MG tablet TAKE 1 TABLET BY MOUTH EVERY DAY 90 tablet 2   metoprolol succinate (TOPROL XL) 25 MG 24 hr tablet Take 1 tablet (25 mg total) by mouth daily. 90 tablet 3   Multiple Vitamins-Minerals (MULTIVITAMINS THER. W/MINERALS) TABS Take 1 tablet by mouth daily.     nitroGLYCERIN (NITROSTAT) 0.4 MG SL tablet Place 1 tablet (0.4 mg total) under the tongue every 5 (five) minutes as needed for chest pain. 25 tablet 5   omeprazole (PRILOSEC) 20 MG capsule Take 1 capsule (20 mg total) by mouth daily. 30 capsule 5   ondansetron (ZOFRAN) 8 MG tablet Take 1 tablet (8 mg total) by mouth every 8 (eight) hours as needed for nausea or vomiting. Starting 3 days after chemotherapy 30 tablet 2   potassium chloride (KLOR-CON) 10 MEQ tablet Take 1 tablet (10 mEq total) by mouth daily. 90 tablet 2   promethazine (PHENERGAN) 25 MG tablet Take 1 tablet (25 mg total) by mouth every 6 (six) hours as needed for nausea or vomiting. 30 tablet 1   Saccharomyces boulardii (PROBIOTIC) 250 MG CAPS Take 1 capsule by mouth daily.     sucralfate (CARAFATE) 1 GM/10ML suspension Take 10 mLs (1 g total) by mouth 4 (four) times daily. Before meals 420 mL 1   tamsulosin (FLOMAX) 0.4 MG CAPS capsule Take 0.4 mg by mouth daily.     Tiotropium Bromide-Olodaterol (STIOLTO RESPIMAT) 2.5-2.5 MCG/ACT AERS Inhale 2 puffs into the lungs daily. 4 g 5   topiramate (TOPAMAX) 50 MG tablet Take 2 tablets (100 mg total) by mouth 2 (two) times daily. 360  tablet 3   traMADol (ULTRAM) 50 MG tablet Take 1 tablet (50 mg total) by mouth every 6 (six) hours as needed for moderate pain. 30 tablet 0   traZODone (DESYREL) 50 MG tablet Take 0.5 tablets (25 mg total) by mouth at bedtime as needed for sleep. 45 tablet 3   Vitamin D, Ergocalciferol, (DRISDOL) 1.25 MG (50000 UNIT) CAPS capsule TAKE 1 CAPSULE BY MOUTH ONE TIME PER WEEK 12 capsule 1   XARELTO 20 MG TABS tablet TAKE 1 TABLET BY MOUTH EVERY DAY 90 tablet 2   No current facility-administered medications for this encounter.  Physical Findings:  vitals were not taken for this visit.  Pain Assessment Pain Score: 0-No pain/10 Unable to assess due to telephone follow-up visit format.  Lab Findings: Lab Results  Component Value Date   WBC 14.4 (H) 05/04/2021   HGB 8.7 (L) 05/04/2021   HCT 26.5 (L) 05/04/2021   MCV 88.9 05/04/2021   PLT 158 05/04/2021     Radiographic Findings: CUP PACEART REMOTE DEVICE CHECK  Result Date: 05/02/2021 ILR summary report received. Battery status OK. Normal device function. No new symptom, tachy, brady, or pause episodes. One previously viewed and reviewed AF episode. Monthly summary reports and ROV/PRN Kathy Breach, RN, CCDS, [employer]  IR IMAGING GUIDED PORT INSERTION  Result Date: 04/16/2021 INDICATION: 75 year old male with history of primary cancer of the left lower lobe. Patient is planned chemotherapy and requires venous access. EXAM: IMPLANTED PORT A CATH PLACEMENT WITH ULTRASOUND AND FLUOROSCOPIC GUIDANCE MEDICATIONS: None ANESTHESIA/SEDATION: Moderate (conscious) sedation was employed during this procedure. A total of Versed 2 mg and Fentanyl 50 mcg was administered intravenously. Moderate Sedation Time: 38 minutes. The patient's level of consciousness and vital signs were monitored continuously by radiology nursing throughout the procedure under my direct supervision. FLUOROSCOPY TIME:  One minutes, 6 seconds (1.61 mGy) COMPLICATIONS:  None immediate. PROCEDURE: The procedure, risks, benefits, and alternatives were explained to the patient. Questions regarding the procedure were encouraged and answered. The patient understands and consents to the procedure. The right neck and chest were prepped with chlorhexidine in a sterile fashion, and a sterile drape was applied covering the operative field. Maximum barrier sterile technique with sterile gowns and gloves were used for the procedure. A timeout was performed prior to the initiation of the procedure. Local anesthesia was provided with 1% lidocaine with epinephrine. After creating a small venotomy incision, a micropuncture kit was utilized to access the internal jugular vein under direct, real-time ultrasound guidance. Ultrasound image documentation was performed. The microwire was kinked to measure appropriate catheter length. A subcutaneous port pocket was then created along the upper chest wall utilizing a combination of sharp and blunt dissection. The pocket was irrigated with sterile saline. A single lumen ISP power injectable port was chosen for placement. The 8 Fr catheter was tunneled from the port pocket site to the venotomy incision. The port was placed in the pocket. The external catheter was trimmed to appropriate length. At the venotomy, an 8 Fr peel-away sheath was placed over a guidewire under fluoroscopic guidance. The catheter was then placed through the sheath and the sheath was removed. At this point, it was felt that the port catheter was to shorten the decision was made to remove and replace the newly-placed port. The port catheter was retracted and a 0.035 inch guidewire was placed through catheter into right atrium. The port and catheter was removed, then a new 8 Fr catheter was tunneled from the port pocket site to the venotomy incision. The port was placed in the pocket. The external catheter was trimmed to appropriate length. At the venotomy, an 8 Fr peel-away sheath was  placed over a guidewire under fluoroscopic guidance. The catheter was then placed through the sheath and the sheath was removed. Final catheter positioning was confirmed and documented with a fluoroscopic spot radiograph. The port was accessed with a Huber needle, aspirated and flushed with heparinized saline. The venotomy site was closed with Dermabond. The port pocket incision was closed with interrupted 3-0 Vicryl suture and Dermabond was applied. Dressings were placed. The  patient tolerated the procedure well without immediate post procedural complication. IMPRESSION: Successful placement of a right internal jugular approach power injectable Port-A-Cath. The catheter is ready for immediate use. Electronically Signed   By: Michaelle Birks MD   On: 04/16/2021 15:45    Impression/Plan: 31. 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 The patient appears to have recovered well from the effects of his recent radiotherapy and is currently without complaints.  We discussed that while we are happy to continue to participate in his care if clinically indicated, at this point, we will plan to see him back on an as-needed basis.  He will continue in routine follow-up under the care and direction of Dr. [name] for continued management of his systemic disease.  I will also reach out to Dr. Junious Silk regarding if/when he should resume the Eligard ADT for management of his prostate cancer.  This has been on hold for the last 2 months due to his significant fatigue associated with bone cancer treatments but I suspect that at minimum, he will need a PSA and testosterone level checked to help better guide when/if the ADT should be resumed.  The patient is aware that this will be at Dr. Lyndal Rainbow discretion and that he should hear from one of the nurses at [facility] urology regarding future lab visit and office visits.  We have enjoyed taking care of him and look  forward to continuing to follow his progress via correspondence.  He knows that he is welcome to call anytime with any questions or concerns related to his previous radiation.    Nicholos Johns, PA-C

## 2021-05-06 NOTE — Progress Notes (Signed)
  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     Indication for treatment:  Palliative       Radiation treatment dates:   03/10/21 - 03/23/21  Site/dose:   The obstructing mass in the LLL was treated to 30 Gy in 10 fractions.  Beams/energy:   A 3D field set-up was employed with 6 MV X-rays  Narrative: The patient tolerated radiation treatment relatively well without any ill side effects.  Plan: The patient has completed radiation treatment. The patient will return to radiation oncology clinic for routine followup in one month. I advised him to call or return sooner if he has any questions or concerns related to his recovery or treatment. ________________________________  Sheral Apley. Tammi Klippel, M.D.

## 2021-05-07 ENCOUNTER — Telehealth: Payer: Self-pay | Admitting: Pulmonary Disease

## 2021-05-07 DIAGNOSIS — Z4801 Encounter for change or removal of surgical wound dressing: Secondary | ICD-10-CM | POA: Diagnosis not present

## 2021-05-07 DIAGNOSIS — J441 Chronic obstructive pulmonary disease with (acute) exacerbation: Secondary | ICD-10-CM | POA: Diagnosis not present

## 2021-05-07 DIAGNOSIS — D630 Anemia in neoplastic disease: Secondary | ICD-10-CM | POA: Diagnosis not present

## 2021-05-07 DIAGNOSIS — J189 Pneumonia, unspecified organism: Secondary | ICD-10-CM | POA: Diagnosis not present

## 2021-05-07 DIAGNOSIS — C3432 Malignant neoplasm of lower lobe, left bronchus or lung: Secondary | ICD-10-CM | POA: Diagnosis not present

## 2021-05-07 DIAGNOSIS — C61 Malignant neoplasm of prostate: Secondary | ICD-10-CM | POA: Diagnosis not present

## 2021-05-07 DIAGNOSIS — J440 Chronic obstructive pulmonary disease with acute lower respiratory infection: Secondary | ICD-10-CM | POA: Diagnosis not present

## 2021-05-07 DIAGNOSIS — J910 Malignant pleural effusion: Secondary | ICD-10-CM | POA: Diagnosis not present

## 2021-05-07 DIAGNOSIS — Z48813 Encounter for surgical aftercare following surgery on the respiratory system: Secondary | ICD-10-CM | POA: Diagnosis not present

## 2021-05-07 NOTE — Telephone Encounter (Signed)
Spoke with the pt's homecare nurse, Langley Gauss  Pt was drained last thurs, 7/28 and they got 75 ml  The spouse was going to drain again Monday 8/1 but forgot, and they did it today and got only 50 ml  She states pt is doing great, sats are upper 90's, no SOB or pain  She is wondering if the pt can have pleurex removed  His next appt here is on 05/22/21  The nursing agency plans to d/c the pt on 8/17 or 8/18 since pt is very independent  Please advise what you think, thanks

## 2021-05-07 NOTE — Telephone Encounter (Signed)
Called [place] but call went straight to VM. Left message for her to call us back.

## 2021-05-08 NOTE — Telephone Encounter (Signed)
Corey Nash, DO  You; Curt Bears, MD; [facility] Procedure 26 minutes ago (12:39 PM)      Yes, we can probably arrange removal.   Dr. [name], do you think he is tolerating treatments well enough at this point for me to remove IPC?   Thanks   Lance Muss, DO  [facility]  05/08/2021 12:38 PM    I called and spoke with Corey [name] and notified of the response per Dr. Valeta Harms. She verbalized understanding. Will forward back to procedure pool and Dr [name].

## 2021-05-11 ENCOUNTER — Inpatient Hospital Stay: Payer: Medicare PPO

## 2021-05-11 ENCOUNTER — Other Ambulatory Visit: Payer: Self-pay

## 2021-05-11 ENCOUNTER — Inpatient Hospital Stay (HOSPITAL_BASED_OUTPATIENT_CLINIC_OR_DEPARTMENT_OTHER): Payer: Medicare PPO | Admitting: Internal Medicine

## 2021-05-11 ENCOUNTER — Encounter: Payer: Self-pay | Admitting: Internal Medicine

## 2021-05-11 ENCOUNTER — Other Ambulatory Visit: Payer: Medicare PPO

## 2021-05-11 VITALS — BP 119/75 | HR 98 | Temp 97.4°F | Resp 20 | Ht 74.0 in | Wt 242.4 lb

## 2021-05-11 DIAGNOSIS — C787 Secondary malignant neoplasm of liver and intrahepatic bile duct: Secondary | ICD-10-CM | POA: Diagnosis not present

## 2021-05-11 DIAGNOSIS — C3490 Malignant neoplasm of unspecified part of unspecified bronchus or lung: Secondary | ICD-10-CM

## 2021-05-11 DIAGNOSIS — K5900 Constipation, unspecified: Secondary | ICD-10-CM

## 2021-05-11 DIAGNOSIS — C778 Secondary and unspecified malignant neoplasm of lymph nodes of multiple regions: Secondary | ICD-10-CM | POA: Diagnosis not present

## 2021-05-11 DIAGNOSIS — C3492 Malignant neoplasm of unspecified part of left bronchus or lung: Secondary | ICD-10-CM | POA: Diagnosis not present

## 2021-05-11 DIAGNOSIS — Z95828 Presence of other vascular implants and grafts: Secondary | ICD-10-CM

## 2021-05-11 DIAGNOSIS — D6481 Anemia due to antineoplastic chemotherapy: Secondary | ICD-10-CM

## 2021-05-11 DIAGNOSIS — D649 Anemia, unspecified: Secondary | ICD-10-CM

## 2021-05-11 DIAGNOSIS — J910 Malignant pleural effusion: Secondary | ICD-10-CM | POA: Diagnosis not present

## 2021-05-11 DIAGNOSIS — Z79899 Other long term (current) drug therapy: Secondary | ICD-10-CM | POA: Diagnosis not present

## 2021-05-11 DIAGNOSIS — C3412 Malignant neoplasm of upper lobe, left bronchus or lung: Secondary | ICD-10-CM | POA: Diagnosis not present

## 2021-05-11 DIAGNOSIS — Z5111 Encounter for antineoplastic chemotherapy: Secondary | ICD-10-CM | POA: Diagnosis not present

## 2021-05-11 DIAGNOSIS — Z5112 Encounter for antineoplastic immunotherapy: Secondary | ICD-10-CM | POA: Diagnosis not present

## 2021-05-11 LAB — CBC WITH DIFFERENTIAL (CANCER CENTER ONLY)
Abs Immature Granulocytes: 1.01 K/uL — ABNORMAL HIGH (ref 0.00–0.07)
Basophils Absolute: 0.1 K/uL (ref 0.0–0.1)
Basophils Relative: 1 %
Eosinophils Absolute: 0.1 K/uL (ref 0.0–0.5)
Eosinophils Relative: 1 %
HCT: 23.9 % — ABNORMAL LOW (ref 39.0–52.0)
Hemoglobin: 7.7 g/dL — ABNORMAL LOW (ref 13.0–17.0)
Immature Granulocytes: 10 %
Lymphocytes Relative: 5 %
Lymphs Abs: 0.5 K/uL — ABNORMAL LOW (ref 0.7–4.0)
MCH: 29.8 pg (ref 26.0–34.0)
MCHC: 32.2 g/dL (ref 30.0–36.0)
MCV: 92.6 fL (ref 80.0–100.0)
Monocytes Absolute: 1.3 K/uL — ABNORMAL HIGH (ref 0.1–1.0)
Monocytes Relative: 13 %
Neutro Abs: 7.3 K/uL (ref 1.7–7.7)
Neutrophils Relative %: 70 %
Platelet Count: 385 K/uL (ref 150–400)
RBC: 2.58 MIL/uL — ABNORMAL LOW (ref 4.22–5.81)
RDW: 19.3 % — ABNORMAL HIGH (ref 11.5–15.5)
WBC Count: 10.3 K/uL (ref 4.0–10.5)
nRBC: 0.9 % — ABNORMAL HIGH (ref 0.0–0.2)

## 2021-05-11 LAB — CMP (CANCER CENTER ONLY)
ALT: 11 U/L (ref 0–44)
AST: 16 U/L (ref 15–41)
Albumin: 2.7 g/dL — ABNORMAL LOW (ref 3.5–5.0)
Alkaline Phosphatase: 60 U/L (ref 38–126)
Anion gap: 8 (ref 5–15)
BUN: 20 mg/dL (ref 8–23)
CO2: 23 mmol/L (ref 22–32)
Calcium: 9 mg/dL (ref 8.9–10.3)
Chloride: 103 mmol/L (ref 98–111)
Creatinine: 0.85 mg/dL (ref 0.61–1.24)
GFR, Estimated: >60 mL/min
Glucose, Bld: 102 mg/dL — ABNORMAL HIGH (ref 70–99)
Potassium: 4.5 mmol/L (ref 3.5–5.1)
Sodium: 134 mmol/L — ABNORMAL LOW (ref 135–145)
Total Bilirubin: 0.3 mg/dL (ref 0.3–1.2)
Total Protein: 6.2 g/dL — ABNORMAL LOW (ref 6.5–8.1)

## 2021-05-11 LAB — SAMPLE TO BLOOD BANK

## 2021-05-11 LAB — TSH: TSH: 1.606 u[IU]/mL (ref 0.320–4.118)

## 2021-05-11 MED ORDER — PALONOSETRON HCL INJECTION 0.25 MG/5ML
INTRAVENOUS | Status: AC
  Filled 2021-05-11: qty 5

## 2021-05-11 MED ORDER — SODIUM CHLORIDE 0.9 % IV SOLN
400.0000 mg/m2 | Freq: Once | INTRAVENOUS | Status: AC
  Administered 2021-05-11: 900 mg via INTRAVENOUS
  Filled 2021-05-11: qty 16

## 2021-05-11 MED ORDER — SODIUM CHLORIDE 0.9 % IV SOLN
150.0000 mg | Freq: Once | INTRAVENOUS | Status: AC
  Administered 2021-05-11: 150 mg via INTRAVENOUS
  Filled 2021-05-11: qty 150

## 2021-05-11 MED ORDER — SODIUM CHLORIDE 0.9% FLUSH
10.0000 mL | Freq: Once | INTRAVENOUS | Status: AC
  Administered 2021-05-11: 10 mL
  Filled 2021-05-11: qty 10

## 2021-05-11 MED ORDER — SODIUM CHLORIDE 0.9% FLUSH
10.0000 mL | INTRAVENOUS | Status: DC | PRN
  Administered 2021-05-11: 10 mL
  Filled 2021-05-11: qty 10

## 2021-05-11 MED ORDER — PALONOSETRON HCL INJECTION 0.25 MG/5ML
0.2500 mg | Freq: Once | INTRAVENOUS | Status: AC
  Administered 2021-05-11: 0.25 mg via INTRAVENOUS

## 2021-05-11 MED ORDER — SODIUM CHLORIDE 0.9 % IV SOLN: 623.0000 mg | Freq: Once | INTRAVENOUS | Status: DC

## 2021-05-11 MED ORDER — SODIUM CHLORIDE 0.9 % IV SOLN
10.0000 mg | Freq: Once | INTRAVENOUS | Status: AC
  Administered 2021-05-11: 10 mg via INTRAVENOUS
  Filled 2021-05-11: qty 10

## 2021-05-11 MED ORDER — CYANOCOBALAMIN 1000 MCG/ML IJ SOLN
INTRAMUSCULAR | Status: AC
  Filled 2021-05-11: qty 1

## 2021-05-11 MED ORDER — SODIUM CHLORIDE 0.9 % IV SOLN
Freq: Once | INTRAVENOUS | Status: AC
Start: 2021-05-11 — End: 2021-05-11
  Filled 2021-05-11: qty 250

## 2021-05-11 MED ORDER — HEPARIN SOD (PORK) LOCK FLUSH 100 UNIT/ML IV SOLN
500.0000 [IU] | Freq: Once | INTRAVENOUS | Status: AC | PRN
  Administered 2021-05-11: 500 [IU]
  Filled 2021-05-11: qty 5

## 2021-05-11 MED ORDER — SODIUM CHLORIDE 0.9 % IV SOLN
200.0000 mg | Freq: Once | INTRAVENOUS | Status: AC
  Administered 2021-05-11: 200 mg via INTRAVENOUS
  Filled 2021-05-11: qty 8

## 2021-05-11 MED ORDER — ONDANSETRON HCL 8 MG PO TABS: 8.0000 mg | ORAL_TABLET | Freq: Three times a day (TID) | ORAL | 2 refills | Status: DC | PRN

## 2021-05-11 MED ORDER — CYANOCOBALAMIN 1000 MCG/ML IJ SOLN
1000.0000 ug | Freq: Once | INTRAMUSCULAR | Status: AC
  Administered 2021-05-11: 1000 ug via INTRAMUSCULAR

## 2021-05-11 MED ORDER — SODIUM CHLORIDE 0.9 % IV SOLN
498.4000 mg | Freq: Once | INTRAVENOUS | Status: AC
  Administered 2021-05-11: 500 mg via INTRAVENOUS
  Filled 2021-05-11: qty 50

## 2021-05-11 NOTE — Telephone Encounter (Signed)
Curt Bears, MD  Rosana Berger, CMA; [facility] Procedure 2 hours ago (12:56 PM)     Yes. Ok to remove it. Thank you     Dr. Valeta Harms when would you like to get him scheduled for a removal?

## 2021-05-11 NOTE — Progress Notes (Signed)
Spoke w/ Dr.  [name] and he would like Carboplatin to be dosed based on AUC = 4 for remaining Carboplatin doses.  Larene Beach, PharmD

## 2021-05-11 NOTE — Progress Notes (Signed)
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     Molecular Studies by [employer]: [id number] 0.5%   Olaparib Yes [id number] 50.9%   Olaparib Yes [id number] 0.8% None      PDL1: 30%   PRIOR THERAPY: 1) Palliative radiotherapy to the obstructive lung mass in the left lung under the care of Dr. MTammi Klippel Last treatment 03/23/21   CURRENT THERAPY: Palliative systemic chemotherapy with carboplatin AUC 5, Alimta 500 mg per metered squared, Keytruda 200 mg IV every 3 weeks.  First dose expected on 03/31/21. Status post 2 cycles.   INTERVAL HISTORY: Corey CLIPPER75y.o. male returns to the clinic today for follow-up visit accompanied by his wife.  The patient tolerated the second cycle of his treatment much better but he continues to have mild fatigue and intermittent nausea.  He denied having any vomiting, diarrhea or constipation.  He has no chest pain but continues to have shortness of breath with exertion with mild cough and no hemoptysis.  He has no headache or visual changes.  He is here today for evaluation before starting cycle #3 of his treatment.  MEDICAL HISTORY: Past Medical History:  Diagnosis Date   Anemia    Anxiety    Arthritis    "back, right knee, hands, ankles, neck" (05/31/2016)   Carotid artery disease ([facility] 08/30/2016   Carotid [place]19: R 1-39, L 40-59 // Carotid [place]11/2020: R 1-39; L 40-59 // Carotid [place]/21: R 1-39; L 40-59>> repeat 1 year    Chronic lower back pain    Coronary atherosclerosis of native coronary artery     a. [place] [facility] [place], otherwise mild nonobstructive disease. EF normal.   Diverticulitis    Dyslipidemia    Dyspnea    Essential hypertension, benign    Family history of breast cancer    Family history of lung cancer    Family history of prostate cancer    GERD (gastroesophageal reflux disease)    Lumbar radiculopathy, chronic 02/04/2015   Right L5   Obesity    OSA on CPAP    uses cpap   Paroxysmal atrial fibrillation ([place] [place]    a. Discovered after stroke.   Pneumonia 01/1996   PONV (postoperative nausea and vomiting)    Prostate CA ([facility]    Recurrent upper respiratory infection (URI)    [place] 07[place]   no deficits   Visit for monitoring Tikosyn therapy 09/18/2019    ALLERGIES:  is allergic to amoxicillin, fosinopril, hydromorphone, ampicillin, monopril [fosinopril sodium], other, and rosuvastatin.  MEDICATIONS:  Current Outpatient Medications  Medication Sig Dispense Refill   acetaminophen (TYLENOL 8 HOUR ARTHRITIS PAIN) 650 MG CR tablet Take 1,300 mg by mouth every 8 (eight) hours as needed for pain (pain).     albuterol (VENTOLIN HFA) 108 (90 Base) MCG/ACT inhaler Inhale 2 puffs into the lungs every 4 (four) hours as needed for wheezing or shortness of breath. Inhale 2 puffs into the lungs every 4 hours as needed 18 g 0   ALPRAZolam (XANAX) 0.5 MG tablet Take 0.5 mg by mouth  at bedtime as needed for anxiety.     atorvastatin (LIPITOR) 40 MG tablet Take 1 tablet (40 mg total) by mouth daily. 90 tablet 3   benzonatate (TESSALON) 200 MG capsule Take 1 capsule (200 mg total) by mouth 3 (three) times daily as needed for cough. 60 capsule 1   Carbinoxamine Maleate 6 MG TABS Take 1 tablet by mouth 2 (two) times daily as needed (drainage). 60 tablet 4   chlorpheniramine-HYDROcodone (TUSSIONEX) 10-8 MG/5ML SUER Take 5 mLs by mouth every 12 (twelve) hours as needed for cough. 473 mL 0   diclofenac Sodium (VOLTAREN) 1 % GEL Apply 4 g topically 4 (four) times daily. 100 g 0    dicyclomine (BENTYL) 20 MG tablet Take 20 mg by mouth as needed for spasms.     diltiazem (CARDIZEM) 30 MG tablet Take 1 tablet (30 mg total) by mouth 4 (four) times daily as needed. 60 tablet 6   dofetilide (TIKOSYN) 500 MCG capsule Take 1 capsule (500 mcg total) by mouth 2 (two) times daily. Please keep upcoming appointment in February for future refills. Thank you 180 capsule 3   fenofibrate 160 MG tablet TAKE 1 TABLET DAILY. PLEASE KEEP UPCOMING APPT IN MARCH WITH DR. Acie Fredrickson BEFORE ANYMORE REFILLS. 90 tablet 3   fluticasone (FLONASE) 50 MCG/ACT nasal spray Place 2 sprays into both nostrils daily. 66.2 mL 5   folic acid (FOLVITE) 1 MG tablet Take 1 tablet (1 mg total) by mouth daily. 30 tablet 2   ipratropium (ATROVENT) 0.03 % nasal spray Place 1-2 sprays into both nostrils every 12 (twelve) hours. For drainage 30 mL 5   leuprolide, 6 Month, (ELIGARD) 45 MG injection Inject 45 mg into the skin every 6 (six) months.     lidocaine-prilocaine (EMLA) cream Apply 1 application topically as needed. 30 g 2   loperamide (IMODIUM) 2 MG capsule Take 2 mg by mouth daily as needed for diarrhea or loose stools.     magnesium oxide (MAG-OX) 400 MG tablet TAKE 1 TABLET BY MOUTH EVERY DAY 90 tablet 2   metoprolol succinate (TOPROL XL) 25 MG 24 hr tablet Take 1 tablet (25 mg total) by mouth daily. 90 tablet 3   Multiple Vitamins-Minerals (MULTIVITAMINS THER. W/MINERALS) TABS Take 1 tablet by mouth daily.     nitroGLYCERIN (NITROSTAT) 0.4 MG SL tablet Place 1 tablet (0.4 mg total) under the tongue every 5 (five) minutes as needed for chest pain. 25 tablet 5   omeprazole (PRILOSEC) 20 MG capsule Take 1 capsule (20 mg total) by mouth daily. 30 capsule 5   ondansetron (ZOFRAN) 8 MG tablet Take 1 tablet (8 mg total) by mouth every 8 (eight) hours as needed for nausea or vomiting. Starting 3 days after chemotherapy 30 tablet 2   potassium chloride (KLOR-CON) 10 MEQ tablet Take 1 tablet (10 mEq total) by mouth daily.  90 tablet 2   promethazine (PHENERGAN) 25 MG tablet Take 1 tablet (25 mg total) by mouth every 6 (six) hours as needed for nausea or vomiting. 30 tablet 1   Saccharomyces boulardii (PROBIOTIC) 250 MG CAPS Take 1 capsule by mouth daily.     sucralfate (CARAFATE) 1 GM/10ML suspension Take 10 mLs (1 g total) by mouth 4 (four) times daily. Before meals 420 mL 1   tamsulosin (FLOMAX) 0.4 MG CAPS capsule Take 0.4 mg by mouth daily.     Tiotropium Bromide-Olodaterol (STIOLTO RESPIMAT) 2.5-2.5 MCG/ACT AERS Inhale 2 puffs into the lungs daily. 4 g 5  topiramate (TOPAMAX) 50 MG tablet Take 2 tablets (100 mg total) by mouth 2 (two) times daily. 360 tablet 3   traMADol (ULTRAM) 50 MG tablet Take 1 tablet (50 mg total) by mouth every 6 (six) hours as needed for moderate pain. 30 tablet 0   traZODone (DESYREL) 50 MG tablet Take 0.5 tablets (25 mg total) by mouth at bedtime as needed for sleep. 45 tablet 3   Vitamin D, Ergocalciferol, (DRISDOL) 1.25 MG (50000 UNIT) CAPS capsule TAKE 1 CAPSULE BY MOUTH ONE TIME PER WEEK 12 capsule 1   XARELTO 20 MG TABS tablet TAKE 1 TABLET BY MOUTH EVERY DAY 90 tablet 2   No current facility-administered medications for this visit.    SURGICAL HISTORY:  Past Surgical History:  Procedure Laterality Date   ADENOIDECTOMY     ANTERIOR CERVICAL DECOMP/DISCECTOMY FUSION  07/2001; 10/2002   "C5-6; C6-7; redo"   BACK SURGERY     CARPAL TUNNEL RELEASE Left 10/2015   CHEST TUBE INSERTION Left 03/17/2021   Procedure: INSERTION PLEURAL DRAINAGE CATHETER;  Surgeon: Garner Nash, DO;  Location: [facility];  Service: Pulmonary;  Laterality: Left;  Indwelling Tunneled Pleural catheter (PLEUREX)    COLONOSCOPY W/ POLYPECTOMY  02/2014   CORONARY ANGIOPLASTY WITH STENT PLACEMENT  05/2003; 12/2005   "mid RCA; mid RCA"   FINE NEEDLE ASPIRATION  02/26/2021   Procedure: FINE NEEDLE ASPIRATION (FNA) LINEAR;  Surgeon: Candee Furbish, MD;  [facility] END[facility]COPY;  Service: Pulmonary;;   HAND  SURGERY  02/2019   LEFT HAND   implantable loop recorder placement  02/27/2020   Med[employer]onic Reveal Gardi model [id number]  implantable loop recorder implanted by Dr Rayann Heman for afib management and evaluation of presyncope   IR IMAGING GUIDED PORT INSERTION  04/16/2021   JOINT REPLACEMENT     KNEE ARTHROSCOPY Left 10/2005   KNEE ARTHROSCOPY W/ PARTIAL MEDIAL MENISCECTOMY Left 09/2005   LUMBAR LAMINECTOMY/DECOMPRESSION MICRODISCECTOMY  03/2005   "L4-5"   POSTERIOR LUMBAR FUSION  10/2003   L5-S1; "plates, screws"   SHOULDER ARTHROSCOPY Right 08/2011   Debridement of labrum, arthroscopic distal clavicle excision   SHOULDER OPEN ROTATOR CUFF REPAIR Left 07/2014   TEE WITHOUT CARDIOVERSION  07/07/2012   Procedure: TRANSESOPHAGEAL ECHOCARDIOGRAM (TEE);  Surgeon: Fay Records, MD;  [facility]COPY;  Service: Cardiovascular;  Laterality: N/A;   THORACENTESIS N/A 02/25/2021   Procedure: Mathews Robinsons;  Surgeon: Juanito Doom, MD;  Location: [facility];  Service: Cardiopulmonary;  Laterality: N/A;   TONSILLECTOMY AND ADENOIDECTOMY  ~ 1956   TOTAL KNEE ARTHROPLASTY Left 10/2006   TRIGGER FINGER RELEASE Left 10/2015   VIDEO BRONCHOSCOPY WITH ENDOBRONCHIAL ULTRASOUND Left 02/26/2021   Procedure: VIDEO BRONCHOSCOPY WITH ENDOBRONCHIAL ULTRASOUND;  Surgeon: Candee Furbish, MD;  Location: [facility] ENDOSCOPY;  Service: Pulmonary;  Laterality: Left;  cryoprobe too thanks!    REVIEW OF SYSTEMS:  A comprehensive review of systems was negative except for: Constitutional: positive for fatigue Respiratory: positive for dyspnea on exertion Gastrointestinal: positive for nausea   PHYSICAL EXAMINATION: General appearance: alert, cooperative, fatigued, and no distress Head: Normocephalic, without obvious abnormality, atraumatic Neck: no adenopathy, no JVD, supple, symmetrical, trachea midline, and thyroid not enlarged, symmetric, no tenderness/mass/nodules Lymph nodes: Cervical, supraclavicular, and  axillary nodes normal. Resp: diminished breath sounds bilaterally Back: symmetric, no curvature. ROM normal. No CVA tenderness. Cardio: regular rate and rhythm, S1, S2 normal, no murmur, click, rub or gallop GI: soft, non-tender; bowel sounds normal; no masses,  no organomegaly Extremities:  extremities normal, atraumatic, no cyanosis or edema  ECOG PERFORMANCE STATUS: 1 - Symptomatic but completely ambulatory  Blood pressure 119/75, pulse 98, temperature (!) 97.4 F (36.3 C), temperature source Tympanic, resp. rate 20, height 6' 2"  (1.88 m), weight 242 lb 6.4 oz (110 kg), SpO2 98 %.  LABORATORY DATA: Lab Results  Component Value Date   WBC 10.3 05/11/2021   HGB 7.7 (L) 05/11/2021   HCT 23.9 (L) 05/11/2021   MCV 92.6 05/11/2021   PLT 385 05/11/2021      Chemistry      Component Value Date/Time   NA 134 (L) 05/11/2021 1320   NA 137 01/21/2021 1553   K 4.5 05/11/2021 1320   CL 103 05/11/2021 1320   CO2 23 05/11/2021 1320   BUN 20 05/11/2021 1320   BUN 17 01/21/2021 1553   CREATININE 0.85 05/11/2021 1320   CREATININE 1.04 08/30/2016 0840      Component Value Date/Time   CALCIUM 9.0 05/11/2021 1320   ALKPHOS 60 05/11/2021 1320   AST 16 05/11/2021 1320   ALT 11 05/11/2021 1320   BILITOT 0.3 05/11/2021 1320       RADIOGRAPHIC STUDIES: CUP PACEART REMOTE DEVICE CHECK  Result Date: 05/02/2021 ILR summary report received. Battery status OK. Normal device function. No new symptom, tachy, brady, or pause episodes. One previously viewed and reviewed AF episode. Monthly summary reports and ROV/PRN Kathy Breach, RN, CCDS, [employer]  IR IMAGING GUIDED PORT INSERTION  Result Date: 04/16/2021 INDICATION: 75 year old male with history of primary cancer of the left lower lobe. Patient is planned chemotherapy and requires venous access. EXAM: IMPLANTED PORT A CATH PLACEMENT WITH ULTRASOUND AND FLUOROSCOPIC GUIDANCE MEDICATIONS: None ANESTHESIA/SEDATION: Moderate (conscious)  sedation was employed during this procedure. A total of Versed 2 mg and Fentanyl 50 mcg was administered intravenously. Moderate Sedation Time: 38 minutes. The patient's level of consciousness and vital signs were monitored continuously by radiology nursing throughout the procedure under my direct supervision. FLUOROSCOPY TIME:  One minutes, 6 seconds (8.33 mGy) COMPLICATIONS: None immediate. PROCEDURE: The procedure, risks, benefits, and alternatives were explained to the patient. Questions regarding the procedure were encouraged and answered. The patient understands and consents to the procedure. The right neck and chest were prepped with chlorhexidine in a sterile fashion, and a sterile drape was applied covering the operative field. Maximum barrier sterile technique with sterile gowns and gloves were used for the procedure. A timeout was performed prior to the initiation of the procedure. Local anesthesia was provided with 1% lidocaine with epinephrine. After creating a small venotomy incision, a micropuncture kit was utilized to access the internal jugular vein under direct, real-time ultrasound guidance. Ultrasound image documentation was performed. The microwire was kinked to measure appropriate catheter length. A subcutaneous port pocket was then created along the upper chest wall utilizing a combination of sharp and blunt dissection. The pocket was irrigated with sterile saline. A single lumen ISP power injectable port was chosen for placement. The 8 Fr catheter was tunneled from the port pocket site to the venotomy incision. The port was placed in the pocket. The external catheter was trimmed to appropriate length. At the venotomy, an 8 Fr peel-away sheath was placed over a guidewire under fluoroscopic guidance. The catheter was then placed through the sheath and the sheath was removed. At this point, it was felt that the port catheter was to shorten the decision was made to remove and replace the  newly-placed port. The port catheter was  retracted and a 0.035 inch guidewire was placed through catheter into right atrium. The port and catheter was removed, then a new 8 Fr catheter was tunneled from the port pocket site to the venotomy incision. The port was placed in the pocket. The external catheter was trimmed to appropriate length. At the venotomy, an 8 Fr peel-away sheath was placed over a guidewire under fluoroscopic guidance. The catheter was then placed through the sheath and the sheath was removed. Final catheter positioning was confirmed and documented with a fluoroscopic spot radiograph. The port was accessed with a Huber needle, aspirated and flushed with heparinized saline. The venotomy site was closed with Dermabond. The port pocket incision was closed with interrupted 3-0 Vicryl suture and Dermabond was applied. Dressings were placed. The patient tolerated the procedure well without immediate post procedural complication. IMPRESSION: Successful placement of a right internal jugular approach power injectable Port-A-Cath. The catheter is ready for immediate use. Electronically Signed   By: Michaelle Birks MD   On: 04/16/2021 15:45    ASSESSMENT AND PLAN: This is a very pleasant 75 years old white male recently diagnosed with stage IV (T1b, N2, M1c) non-small cell lung cancer favoring adenocarcinoma presented with left lung mass in addition to left hilar adenopathy and malignant left pleural effusion, intrathoracic and upper abdominal lymph nodes, left pleural/subpleural nodularity and hepatic lesions diagnosed in May 2022. He has no actionable mutation and PD-L1 expression of 30%. He is status post palliative radiotherapy to the obstructive lung mass in the left lung under the care of Dr. Tammi Klippel.  The patient is currently undergoing systemic chemotherapy with carboplatin initially for AUC of 5, Alimta 500 Mg/M2 and Keytruda 200 Mg IV every 3 weeks status post 2 cycles.  Starting from cycle #2 the  patient is on treatment with carboplatin for AUC of 4 and Alimta 400 Mg/M2.  His dose was reduced secondary to intolerance during cycle #1. I recommended for the patient to proceed with cycle #3 today as planned. For the nausea he will continue his treatment with Zofran on as-needed basis. The patient will come back for follow-up visit in 3 weeks for evaluation with repeat CT scan of the chest, abdomen and pelvis for restaging of his disease. For the chemotherapy-induced anemia, will arrange for the patient to have 2 units of PRBCs transfusion in the next few days. For the cough, I will give him refill of Tessalon Perles. For the constipation he was advised to use MiraLAX in addition to the stool softener. The patient was advised to call immediately if he has any concerning symptoms in the interval. The patient voices understanding of current disease status and treatment options and is in agreement with the current care plan.  All questions were answered. The patient knows to call the clinic with any problems, questions or concerns. We can certainly see the patient much sooner if necessary. The total time spent in the appointment was 30 minutes.  Disclaimer: This note was dictated with voice recognition software. Similar sounding words can inadvertently be transcribed and may not be corrected upon review.

## 2021-05-11 NOTE — Progress Notes (Signed)
Per Dr [name] ,it is okay to treat pt today with Carboplatin ,alimta and keytruda and hgb of 7.7.

## 2021-05-11 NOTE — Patient Instructions (Addendum)
Blood transfusion on Friday Aug 12 at 9 am at  [facility]. [address] [place], [place] [address] [place] ONCOLOGY  Discharge Instructions: Thank you for choosing [facility] to provide your oncology and hematology care.   If you have a lab appointment with the [facility], please go directly to the [facility] and check in at the registration area.   Wear comfortable clothing and clothing appropriate for easy access to any Portacath or PICC line.   We strive to give you quality time with your provider. You may need to reschedule your appointment if you arrive late (15 or more minutes).  Arriving late affects you and other patients whose appointments are after yours.  Also, if you miss three or more appointments without notifying the office, you may be dismissed from the clinic at the provider's discretion.      For prescription refill requests, have your pharmacy contact our office and allow 72 hours for refills to be completed.    Today you received the following chemotherapy and/or immunotherapy agents : Keytruda, Carboplatin. Alimta    To help prevent nausea and vomiting after your treatment, we encourage you to take your nausea medication as directed.  BELOW ARE SYMPTOMS THAT SHOULD BE REPORTED IMMEDIATELY: *FEVER GREATER THAN 100.4 F (38 C) OR HIGHER *CHILLS OR SWEATING *NAUSEA AND VOMITING THAT IS NOT CONTROLLED WITH YOUR NAUSEA MEDICATION *UNUSUAL SHORTNESS OF BREATH *UNUSUAL BRUISING OR BLEEDING *URINARY PROBLEMS (pain or burning when urinating, or frequent urination) *BOWEL PROBLEMS (unusual diarrhea, constipation, pain near the anus) TENDERNESS IN MOUTH AND THROAT WITH OR WITHOUT PRESENCE OF ULCERS (sore throat, sores in mouth, or a toothache) UNUSUAL RASH, SWELLING OR PAIN  UNUSUAL VAGINAL DISCHARGE OR ITCHING   Items with * indicate a potential emergency and should be followed up as  soon as possible or go to the Emergency Department if any problems should occur.  Please show the CHEMOTHERAPY ALERT CARD or IMMUNOTHERAPY ALERT CARD at check-in to the Emergency Department and triage nurse.  Should you have questions after your visit or need to cancel or reschedule your appointment, please contact [facility]  Dept: [phone]  and follow the prompts.  Office hours are 8:00 a.m. to 4:30 p.m. Monday - Friday. Please note that voicemails left after 4:00 p.m. may not be returned until the following business day.  We are closed weekends and major holidays. You have access to a nurse at all times for urgent questions. Please call the main number to the clinic Dept: [phone] and follow the prompts.   For any non-urgent questions, you may also contact your provider using MyChart. We now offer e-Visits for anyone 39 and older to request care online for non-urgent symptoms. For details visit [email or web address].   Also download the MyChart app! Go to the app store, search "MyChart", open the app, select [facility], and log in with your MyChart username and password.  Due to Covid, a mask is required upon entering the hospital/clinic. If you do not have a mask, one will be given to you upon arrival. For doctor visits, patients may have 1 support person aged 89 or older with them. For treatment visits, patients cannot have anyone with them due to current Covid guidelines and our immunocompromised population.

## 2021-05-12 NOTE — Telephone Encounter (Signed)
Corey [name]/[name],  Can we get scheduled for pleurex removal in endoscopy next week?  Maybe Wednesday afternoon the 17th?  Thanks  BLI   ATC patient and let him know he is scheduled for 05/20/21 at 2pm arrive to [facility] at 1:30 go to admissions.   When patient calls back please share this update with him

## 2021-05-12 NOTE — Telephone Encounter (Signed)
Called spoke with patient.  Wanted to know about stopping xarleto prior to procedure.   Sending to Dr. Valeta Harms for recommendations.

## 2021-05-13 ENCOUNTER — Inpatient Hospital Stay: Payer: Medicare PPO

## 2021-05-13 ENCOUNTER — Other Ambulatory Visit: Payer: Self-pay

## 2021-05-13 VITALS — BP 106/57 | HR 92 | Temp 98.3°F | Resp 19

## 2021-05-13 DIAGNOSIS — C778 Secondary and unspecified malignant neoplasm of lymph nodes of multiple regions: Secondary | ICD-10-CM | POA: Diagnosis not present

## 2021-05-13 DIAGNOSIS — Z4801 Encounter for change or removal of surgical wound dressing: Secondary | ICD-10-CM | POA: Diagnosis not present

## 2021-05-13 DIAGNOSIS — J189 Pneumonia, unspecified organism: Secondary | ICD-10-CM | POA: Diagnosis not present

## 2021-05-13 DIAGNOSIS — J440 Chronic obstructive pulmonary disease with acute lower respiratory infection: Secondary | ICD-10-CM | POA: Diagnosis not present

## 2021-05-13 DIAGNOSIS — C61 Malignant neoplasm of prostate: Secondary | ICD-10-CM | POA: Diagnosis not present

## 2021-05-13 DIAGNOSIS — C787 Secondary malignant neoplasm of liver and intrahepatic bile duct: Secondary | ICD-10-CM | POA: Diagnosis not present

## 2021-05-13 DIAGNOSIS — Z5112 Encounter for antineoplastic immunotherapy: Secondary | ICD-10-CM | POA: Diagnosis not present

## 2021-05-13 DIAGNOSIS — Z95828 Presence of other vascular implants and grafts: Secondary | ICD-10-CM

## 2021-05-13 DIAGNOSIS — C3492 Malignant neoplasm of unspecified part of left bronchus or lung: Secondary | ICD-10-CM

## 2021-05-13 DIAGNOSIS — C3412 Malignant neoplasm of upper lobe, left bronchus or lung: Secondary | ICD-10-CM | POA: Diagnosis not present

## 2021-05-13 DIAGNOSIS — D6481 Anemia due to antineoplastic chemotherapy: Secondary | ICD-10-CM | POA: Diagnosis not present

## 2021-05-13 DIAGNOSIS — D630 Anemia in neoplastic disease: Secondary | ICD-10-CM | POA: Diagnosis not present

## 2021-05-13 DIAGNOSIS — Z5111 Encounter for antineoplastic chemotherapy: Secondary | ICD-10-CM | POA: Diagnosis not present

## 2021-05-13 DIAGNOSIS — J441 Chronic obstructive pulmonary disease with (acute) exacerbation: Secondary | ICD-10-CM | POA: Diagnosis not present

## 2021-05-13 DIAGNOSIS — Z48813 Encounter for surgical aftercare following surgery on the respiratory system: Secondary | ICD-10-CM | POA: Diagnosis not present

## 2021-05-13 DIAGNOSIS — J910 Malignant pleural effusion: Secondary | ICD-10-CM | POA: Diagnosis not present

## 2021-05-13 DIAGNOSIS — C3432 Malignant neoplasm of lower lobe, left bronchus or lung: Secondary | ICD-10-CM | POA: Diagnosis not present

## 2021-05-13 DIAGNOSIS — Z79899 Other long term (current) drug therapy: Secondary | ICD-10-CM | POA: Diagnosis not present

## 2021-05-13 LAB — TYPE AND SCREEN
ABO/RH(D): O POS
Antibody Screen: NEGATIVE

## 2021-05-13 MED ORDER — PEGFILGRASTIM-CBQV 6 MG/0.6ML ~~LOC~~ SOSY
6.0000 mg | PREFILLED_SYRINGE | Freq: Once | SUBCUTANEOUS | Status: AC
  Administered 2021-05-13: 6 mg via SUBCUTANEOUS

## 2021-05-13 MED ORDER — SODIUM CHLORIDE 0.9% FLUSH
10.0000 mL | Freq: Once | INTRAVENOUS | Status: AC
  Administered 2021-05-13: 10 mL
  Filled 2021-05-13: qty 10

## 2021-05-13 MED ORDER — HEPARIN SOD (PORK) LOCK FLUSH 100 UNIT/ML IV SOLN
500.0000 [IU] | Freq: Once | INTRAVENOUS | Status: AC
  Administered 2021-05-13: 500 [IU]
  Filled 2021-05-13: qty 5

## 2021-05-13 MED ORDER — PEGFILGRASTIM-CBQV 6 MG/0.6ML ~~LOC~~ SOSY
PREFILLED_SYRINGE | SUBCUTANEOUS | Status: AC
  Filled 2021-05-13: qty 0.6

## 2021-05-13 NOTE — Patient Instructions (Signed)

## 2021-05-13 NOTE — Telephone Encounter (Signed)
No he can stay on it.  No need to stop  BLI    Called left detailed message per DPR with Dr. Juline Patch recommendations  Nothing further needed at this time.

## 2021-05-14 ENCOUNTER — Other Ambulatory Visit: Payer: Self-pay

## 2021-05-14 ENCOUNTER — Other Ambulatory Visit: Payer: Medicare PPO

## 2021-05-14 ENCOUNTER — Encounter (HOSPITAL_COMMUNITY): Payer: Self-pay

## 2021-05-14 ENCOUNTER — Observation Stay (HOSPITAL_COMMUNITY)
Admission: EM | Admit: 2021-05-14 | Discharge: 2021-05-20 | Disposition: A | Discharge status: Home / Self Care | Payer: Medicare PPO | Attending: Family Medicine | Admitting: Family Medicine

## 2021-05-14 DIAGNOSIS — R531 Weakness: Secondary | ICD-10-CM | POA: Diagnosis not present

## 2021-05-14 DIAGNOSIS — D649 Anemia, unspecified: Principal | ICD-10-CM | POA: Diagnosis present

## 2021-05-14 DIAGNOSIS — J449 Chronic obstructive pulmonary disease, unspecified: Secondary | ICD-10-CM | POA: Diagnosis not present

## 2021-05-14 DIAGNOSIS — Z20822 Contact with and (suspected) exposure to covid-19: Secondary | ICD-10-CM | POA: Insufficient documentation

## 2021-05-14 DIAGNOSIS — I10 Essential (primary) hypertension: Secondary | ICD-10-CM | POA: Insufficient documentation

## 2021-05-14 DIAGNOSIS — I2510 Atherosclerotic heart disease of native coronary artery without angina pectoris: Secondary | ICD-10-CM | POA: Diagnosis present

## 2021-05-14 DIAGNOSIS — Z79899 Other long term (current) drug therapy: Secondary | ICD-10-CM | POA: Diagnosis not present

## 2021-05-14 DIAGNOSIS — C3492 Malignant neoplasm of unspecified part of left bronchus or lung: Secondary | ICD-10-CM | POA: Diagnosis present

## 2021-05-14 DIAGNOSIS — Z8546 Personal history of malignant neoplasm of prostate: Secondary | ICD-10-CM | POA: Insufficient documentation

## 2021-05-14 DIAGNOSIS — E785 Hyperlipidemia, unspecified: Secondary | ICD-10-CM | POA: Diagnosis present

## 2021-05-14 DIAGNOSIS — Z8673 Personal history of transient ischemic attack (TIA), and cerebral infarction without residual deficits: Secondary | ICD-10-CM | POA: Insufficient documentation

## 2021-05-14 DIAGNOSIS — Z7901 Long term (current) use of anticoagulants: Secondary | ICD-10-CM | POA: Insufficient documentation

## 2021-05-14 DIAGNOSIS — K219 Gastro-esophageal reflux disease without esophagitis: Secondary | ICD-10-CM | POA: Diagnosis present

## 2021-05-14 DIAGNOSIS — I480 Paroxysmal atrial fibrillation: Secondary | ICD-10-CM | POA: Diagnosis not present

## 2021-05-14 DIAGNOSIS — T859XXA Unspecified complication of internal prosthetic device, implant and graft, initial encounter: Secondary | ICD-10-CM

## 2021-05-14 DIAGNOSIS — Z96652 Presence of left artificial knee joint: Secondary | ICD-10-CM | POA: Insufficient documentation

## 2021-05-14 DIAGNOSIS — I4581 Long QT syndrome: Secondary | ICD-10-CM | POA: Insufficient documentation

## 2021-05-14 DIAGNOSIS — R000 Tachycardia, unspecified: Secondary | ICD-10-CM | POA: Diagnosis not present

## 2021-05-14 LAB — CBC
HCT: 21.3 % — ABNORMAL LOW (ref 39.0–52.0)
HCT: 26.9 % — ABNORMAL LOW (ref 39.0–52.0)
Hemoglobin: 6.7 g/dL — CL (ref 13.0–17.0)
Hemoglobin: 8.7 g/dL — ABNORMAL LOW (ref 13.0–17.0)
MCH: 30.2 pg (ref 26.0–34.0)
MCH: 28.9 pg (ref 26.0–34.0)
MCHC: 31.5 g/dL (ref 30.0–36.0)
MCHC: 32.3 g/dL (ref 30.0–36.0)
MCV: 95.9 fL (ref 80.0–100.0)
MCV: 89.4 fL (ref 80.0–100.0)
Platelets: 348 10*3/uL (ref 150–400)
Platelets: 306 10*3/uL (ref 150–400)
RBC: 2.22 MIL/uL — ABNORMAL LOW (ref 4.22–5.81)
RBC: 3.01 MIL/uL — ABNORMAL LOW (ref 4.22–5.81)
RDW: 20.1 % — ABNORMAL HIGH (ref 11.5–15.5)
RDW: 20.4 % — ABNORMAL HIGH (ref 11.5–15.5)
WBC: 32 10*3/uL — ABNORMAL HIGH (ref 4.0–10.5)
WBC: 29.1 10*3/uL — ABNORMAL HIGH (ref 4.0–10.5)
nRBC: 0.1 % (ref 0.0–0.2)
nRBC: 0 % (ref 0.0–0.2)

## 2021-05-14 LAB — FERRITIN: Ferritin: 577 ng/mL — ABNORMAL HIGH (ref 24–336)

## 2021-05-14 LAB — URINALYSIS, ROUTINE W REFLEX MICROSCOPIC
Bilirubin Urine: NEGATIVE
Glucose, UA: NEGATIVE mg/dL
Hgb urine dipstick: NEGATIVE
Ketones, ur: NEGATIVE mg/dL
Leukocytes,Ua: NEGATIVE
Nitrite: NEGATIVE
Protein, ur: NEGATIVE mg/dL
Specific Gravity, Urine: 1.014 (ref 1.005–1.030)
pH: 6 (ref 5.0–8.0)

## 2021-05-14 LAB — MAGNESIUM: Magnesium: 1.8 mg/dL (ref 1.7–2.4)

## 2021-05-14 LAB — BASIC METABOLIC PANEL
Anion gap: 10 (ref 5–15)
BUN: 22 mg/dL (ref 8–23)
CO2: 21 mmol/L — ABNORMAL LOW (ref 22–32)
Calcium: 8.5 mg/dL — ABNORMAL LOW (ref 8.9–10.3)
Chloride: 100 mmol/L (ref 98–111)
Creatinine, Ser: 0.7 mg/dL (ref 0.61–1.24)
GFR, Estimated: >60 mL/min (ref >60)
Glucose, Bld: 104 mg/dL — ABNORMAL HIGH (ref 70–99)
Potassium: 3.9 mmol/L (ref 3.5–5.1)
Sodium: 131 mmol/L — ABNORMAL LOW (ref 135–145)

## 2021-05-14 LAB — SARS CORONAVIRUS 2 (TAT 6-24 HRS): SARS Coronavirus 2: NEGATIVE

## 2021-05-14 LAB — IRON AND TIBC
Iron: 100 ug/dL (ref 45–182)
Saturation Ratios: 30 % (ref 17.9–39.5)
TIBC: 331 ug/dL (ref 250–450)
UIBC: 231 ug/dL

## 2021-05-14 LAB — CBG MONITORING, ED: Glucose-Capillary: 106 mg/dL — ABNORMAL HIGH (ref 70–99)

## 2021-05-14 LAB — POC OCCULT BLOOD, ED: Fecal Occult Bld: NEGATIVE

## 2021-05-14 LAB — PREPARE RBC (CROSSMATCH)

## 2021-05-14 MED ORDER — TRAZODONE HCL 50 MG PO TABS: 25.0000 mg | ORAL_TABLET | Freq: Every evening | ORAL | Status: DC | PRN

## 2021-05-14 MED ORDER — DOFETILIDE 250 MCG PO CAPS
500.0000 ug | ORAL_CAPSULE | Freq: Two times a day (BID) | ORAL | Status: DC
  Administered 2021-05-14 – 2021-05-20 (×13): 500 ug via ORAL
  Filled 2021-05-14 (×14): qty 2

## 2021-05-14 MED ORDER — ACETAMINOPHEN 325 MG PO TABS: 650.0000 mg | ORAL_TABLET | Freq: Four times a day (QID) | ORAL | Status: DC | PRN

## 2021-05-14 MED ORDER — ONDANSETRON HCL 4 MG/2ML IJ SOLN: 4.0000 mg | Freq: Three times a day (TID) | INTRAMUSCULAR | Status: DC | PRN

## 2021-05-14 MED ORDER — FLUTICASONE PROPIONATE 50 MCG/ACT NA SUSP
2.0000 | Freq: Every day | NASAL | Status: DC
  Administered 2021-05-14 – 2021-05-20 (×7): 2 via NASAL
  Filled 2021-05-14: qty 16

## 2021-05-14 MED ORDER — ONDANSETRON HCL 4 MG/2ML IJ SOLN: 4.0000 mg | Freq: Four times a day (QID) | INTRAMUSCULAR | Status: DC | PRN

## 2021-05-14 MED ORDER — ALPRAZOLAM 0.5 MG PO TABS
0.5000 mg | ORAL_TABLET | Freq: Every evening | ORAL | Status: DC | PRN
  Administered 2021-05-14 – 2021-05-16 (×3): 0.5 mg via ORAL
  Filled 2021-05-14 (×3): qty 1

## 2021-05-14 MED ORDER — METOPROLOL SUCCINATE ER 25 MG PO TB24
25.0000 mg | ORAL_TABLET | Freq: Every day | ORAL | Status: DC
  Administered 2021-05-14 – 2021-05-20 (×7): 25 mg via ORAL
  Filled 2021-05-14 (×7): qty 1

## 2021-05-14 MED ORDER — ATORVASTATIN CALCIUM 40 MG PO TABS
40.0000 mg | ORAL_TABLET | Freq: Every day | ORAL | Status: DC
  Administered 2021-05-14 – 2021-05-20 (×7): 40 mg via ORAL
  Filled 2021-05-14 (×7): qty 1

## 2021-05-14 MED ORDER — ALBUTEROL SULFATE HFA 108 (90 BASE) MCG/ACT IN AERS
2.0000 | INHALATION_SPRAY | RESPIRATORY_TRACT | Status: DC | PRN
  Filled 2021-05-14: qty 6.7

## 2021-05-14 MED ORDER — IPRATROPIUM BROMIDE 0.03 % NA SOLN
1.0000 | Freq: Two times a day (BID) | NASAL | Status: DC
  Administered 2021-05-14 – 2021-05-17 (×6): 2 via NASAL
  Administered 2021-05-18: 1 via NASAL
  Administered 2021-05-18 – 2021-05-20 (×4): 2 via NASAL
  Filled 2021-05-14: qty 30

## 2021-05-14 MED ORDER — TAMSULOSIN HCL 0.4 MG PO CAPS
0.4000 mg | ORAL_CAPSULE | Freq: Every day | ORAL | Status: DC
  Administered 2021-05-14 – 2021-05-19 (×6): 0.4 mg via ORAL
  Filled 2021-05-14 (×6): qty 1

## 2021-05-14 MED ORDER — BENZONATATE 100 MG PO CAPS
200.0000 mg | ORAL_CAPSULE | Freq: Three times a day (TID) | ORAL | Status: DC | PRN
  Administered 2021-05-14 – 2021-05-20 (×5): 200 mg via ORAL
  Filled 2021-05-14 (×5): qty 2

## 2021-05-14 MED ORDER — NITROGLYCERIN 0.4 MG SL SUBL: 0.4000 mg | SUBLINGUAL_TABLET | SUBLINGUAL | Status: DC | PRN

## 2021-05-14 MED ORDER — ACETAMINOPHEN 650 MG RE SUPP: 650.0000 mg | Freq: Four times a day (QID) | RECTAL | Status: DC | PRN

## 2021-05-14 MED ORDER — TOPIRAMATE 100 MG PO TABS
100.0000 mg | ORAL_TABLET | Freq: Two times a day (BID) | ORAL | Status: DC
  Administered 2021-05-14 – 2021-05-20 (×12): 100 mg via ORAL
  Filled 2021-05-14 (×12): qty 1

## 2021-05-14 MED ORDER — TRAMADOL HCL 50 MG PO TABS: 50.0000 mg | ORAL_TABLET | Freq: Four times a day (QID) | ORAL | Status: DC | PRN

## 2021-05-14 MED ORDER — CHLORHEXIDINE GLUCONATE CLOTH 2 % EX PADS
6.0000 | MEDICATED_PAD | Freq: Every day | CUTANEOUS | Status: DC
  Administered 2021-05-14 – 2021-05-20 (×7): 6 via TOPICAL

## 2021-05-14 MED ORDER — SUCRALFATE 1 GM/10ML PO SUSP: 1.0000 g | Freq: Four times a day (QID) | ORAL | Status: DC | PRN

## 2021-05-14 MED ORDER — PROCHLORPERAZINE EDISYLATE 10 MG/2ML IJ SOLN: 5.0000 mg | Freq: Four times a day (QID) | INTRAMUSCULAR | Status: DC | PRN

## 2021-05-14 MED ORDER — FOLIC ACID 1 MG PO TABS
1.0000 mg | ORAL_TABLET | Freq: Every day | ORAL | Status: DC
  Administered 2021-05-14 – 2021-05-20 (×7): 1 mg via ORAL
  Filled 2021-05-14 (×7): qty 1

## 2021-05-14 MED ORDER — PANTOPRAZOLE SODIUM 40 MG PO TBEC
40.0000 mg | DELAYED_RELEASE_TABLET | Freq: Every day | ORAL | Status: DC
  Administered 2021-05-14 – 2021-05-20 (×7): 40 mg via ORAL
  Filled 2021-05-14 (×7): qty 1

## 2021-05-14 MED ORDER — FENOFIBRATE 160 MG PO TABS
160.0000 mg | ORAL_TABLET | Freq: Every day | ORAL | Status: DC
  Administered 2021-05-14 – 2021-05-20 (×7): 160 mg via ORAL
  Filled 2021-05-14 (×7): qty 1

## 2021-05-14 MED ORDER — ARFORMOTEROL TARTRATE 15 MCG/2ML IN NEBU
15.0000 ug | INHALATION_SOLUTION | Freq: Two times a day (BID) | RESPIRATORY_TRACT | Status: DC
  Administered 2021-05-15 – 2021-05-20 (×11): 15 ug via RESPIRATORY_TRACT
  Filled 2021-05-14 (×13): qty 2

## 2021-05-14 MED ORDER — ONDANSETRON HCL 4 MG/2ML IJ SOLN
4.0000 mg | Freq: Three times a day (TID) | INTRAMUSCULAR | Status: DC | PRN
  Administered 2021-05-14 – 2021-05-17 (×6): 4 mg via INTRAVENOUS
  Filled 2021-05-14 (×6): qty 2

## 2021-05-14 MED ORDER — ONDANSETRON HCL 4 MG PO TABS: 4.0000 mg | ORAL_TABLET | Freq: Four times a day (QID) | ORAL | Status: DC | PRN

## 2021-05-14 MED ORDER — ENOXAPARIN SODIUM 40 MG/0.4ML IJ SOSY: 40.0000 mg | PREFILLED_SYRINGE | INTRAMUSCULAR | Status: DC

## 2021-05-14 MED ORDER — SODIUM CHLORIDE 0.9 % IV SOLN
10.0000 mL/h | Freq: Once | INTRAVENOUS | Status: AC
  Administered 2021-05-14: 10 mL/h via INTRAVENOUS

## 2021-05-14 MED ORDER — DILTIAZEM HCL 30 MG PO TABS: 30.0000 mg | ORAL_TABLET | Freq: Four times a day (QID) | ORAL | Status: DC | PRN

## 2021-05-14 MED ORDER — UMECLIDINIUM BROMIDE 62.5 MCG/INH IN AEPB
1.0000 | INHALATION_SPRAY | Freq: Every day | RESPIRATORY_TRACT | Status: DC
  Administered 2021-05-15 – 2021-05-20 (×6): 1 via RESPIRATORY_TRACT
  Filled 2021-05-14 (×2): qty 7

## 2021-05-14 NOTE — Plan of Care (Signed)
  Problem: Clinical Measurements: Goal: Respiratory complications will improve Outcome: Progressing   Problem: Clinical Measurements: Goal: Cardiovascular complication will be avoided Outcome: Progressing   Problem: Clinical Measurements: Goal: Diagnostic test results will improve Outcome: Progressing   Problem: Pain Managment: Goal: General experience of comfort will improve Outcome: Progressing   Problem: Skin Integrity: Goal: Risk for impaired skin integrity will decrease Outcome: Progressing

## 2021-05-14 NOTE — H&P (Signed)
History and Physical    Corey [name] EXH:[id number] DOB: 1946/02/20 DOA: 05/14/2021  PCP: [name], MD  Patient coming from: Home  I have personally briefly reviewed patient's old medical records in [place]  Chief Complaint: Generalized weakness and shortness of breath  HPI: Corey [name] is a 75 y.o. male with medical history significant of A. 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  His last chemo was on 05/11/2021 and had labs done after the chemo which shows hemoglobin of 7.7.  He is scheduled for blood transfusion tomorrow however tells me that he feels extremely weak, fatigue with exertional dyspnea and nausea therefore he called EMS and came to the hospital for further evaluation and management.  He received blood transfusion during his second chemo session.  He denies melena, hematemesis, hemoptysis, hematuria or epistaxis.  He is on Xarelto for A. fib.  Denies abdominal pain, epigastric burning, vomiting, headache, blurry vision, chest pain, palpitation, leg swelling, orthopnea or PND.  ED Course: Upon arrival to ED: His heart rate noted to be in 100s.  Hemoglobin 6.7, WBC 32,000, sodium 131.  EDP ordered 2 unit PRBC.  [facility] hospitalist consulted for admission for symptomatic anemia.  Review of Systems: As per HPI otherwise negative.    Past Medical History:  Diagnosis Date   Anemia    Anxiety    Arthritis    "back, right knee, hands, ankles, neck" (05/31/2016)   Carotid artery disease ([place]) 08/30/2016   Carotid US 11/19: R 1-39, L 40-59 // Carotid US 08/2019: R 1-39; L 40-59 // Carotid US 11/21: R 1-39; L 40-59>> repeat 1 year    Chronic lower back pain    Coronary atherosclerosis of native coronary artery    a. BMS to  [facility] 2004 and 2007, otherwise mild nonobstructive disease. EF normal.   Diverticulitis    Dyslipidemia    Dyspnea    Essential hypertension, benign    Family history of breast cancer    Family history of lung cancer    Family history of prostate cancer    GERD (gastroesophageal reflux disease)    Lumbar radiculopathy, chronic 02/04/2015   Right L5   Obesity    OSA on CPAP    uses cpap   Paroxysmal atrial fibrillation ([place])    a. Discovered after stroke.   Pneumonia 01/1996   PONV (postoperative nausea and vomiting)    Prostate CA ([place])    Recurrent upper respiratory infection (URI)    Stroke ([place]) 07/2012   no deficits   Visit for monitoring Tikosyn therapy 09/18/2019    Past Surgical History:  Procedure Laterality Date   ADENOIDECTOMY     ANTERIOR CERVICAL DECOMP/DISCECTOMY FUSION  07/2001; 10/2002   "C5-6; C6-7; redo"   BACK SURGERY     CARPAL TUNNEL RELEASE Left 10/2015   CHEST TUBE INSERTION Left 03/17/2021   Procedure: INSERTION PLEURAL DRAINAGE CATHETER;  Surgeon: Garner Nash, DO;  Location: [place];  Service: Pulmonary;  Laterality: Left;  Indwelling Tunneled Pleural catheter (PLEUREX)    COLONOSCOPY W/ POLYPECTOMY  02/2014   CORONARY ANGIOPLASTY WITH STENT PLACEMENT  05/2003; 12/2005   "mid RCA; mid RCA"   FINE NEEDLE ASPIRATION  02/26/2021   Procedure: FINE NEEDLE ASPIRATION (FNA) LINEAR;  Surgeon: Candee Furbish, MD;  Location:  [place] [facility];  Service: Pulmonary;;   HAND SURGERY  02/2019   LEFT HAND   implantable loop recorder placement  02/27/2020   [employer] Reveal Oelrichs model [id number] [id number]  implantable loop recorder implanted by Dr Rayann Heman for afib management and evaluation of presyncope   IR IMAGING GUIDED PORT INSERTION  04/16/2021   JOINT REPLACEMENT     KNEE ARTHROSCOPY Left 10/2005   KNEE ARTHROSCOPY W/ PARTIAL MEDIAL MENISCECTOMY Left 09/2005   LUMBAR LAMINECTOMY/DECOMPRESSION MICRODISCECTOMY  03/2005   "L4-5"   POSTERIOR LUMBAR FUSION  10/2003    L5-S1; "plates, screws"   SHOULDER ARTHROSCOPY Right 08/2011   Debridement of labrum, arthroscopic distal clavicle excision   SHOULDER OPEN ROTATOR CUFF REPAIR Left 07/2014   TEE WITHOUT CARDIOVERSION  07/07/2012   Procedure: TRANSESOPHAGEAL ECHOCARDIOGRAM (TEE);  Surgeon: Fay Records, MD;  Location: [facility] ENDOSCOPY;  Service: Cardiovascular;  Laterality: N/A;   THORACENTESIS N/A 02/25/2021   Procedure: Mathews Robinsons;  Surgeon: Juanito Doom, MD;  Location: [place];  Service: Cardiopulmonary;  Laterality: N/A;   TONSILLECTOMY AND ADENOIDECTOMY  ~ 1956   TOTAL KNEE ARTHROPLASTY Left 10/2006   TRIGGER FINGER RELEASE Left 10/2015   VIDEO BRONCHOSCOPY WITH ENDOBRONCHIAL ULTRASOUND Left 02/26/2021   Procedure: VIDEO BRONCHOSCOPY WITH ENDOBRONCHIAL ULTRASOUND;  Surgeon: Candee Furbish, MD;  Location: [facility] END[facility]COPY;  Service: Pulmonary;  Laterality: Left;  cryoprobe too thanks!     reports that he quit smoking about 18 years ago. His smoking use included cigarettes. He has a 34.00 pack-year smoking history. He has never used smokeless tobacco. He reports that he does not drink alcohol and does not use drugs.  Allergies  Allergen Reactions   Amoxicillin Swelling    Tolerates Rocephin   Fosinopril Other (See Comments)   Hydromorphone Nausea Only, Other (See Comments) and Nausea And Vomiting   Ampicillin Rash   Monopril [Fosinopril Sodium] Other (See Comments)    Muscle aches & pain   Other Other (See Comments)   Rosuvastatin Other (See Comments)    Joint pain    Family History  Problem Relation Age of Onset   Hypertension Mother    Heart disease Father    Heart attack Father    Prostate cancer Brother 60   Parkinson's disease Brother    Lung cancer Paternal Aunt        hx of smoking   Breast cancer Paternal Grandmother        dx 24s, bilateral mastectomies   Heart attack Paternal Grandfather    Blindness Son    Colon cancer Neg Hx    Pancreatic cancer Neg Hx     Prior to  Admission medications   Medication Sig Start Date End Date Taking? Authorizing Provider  acetaminophen (TYLENOL 8 HOUR ARTHRITIS PAIN) 650 MG CR tablet Take 1,300 mg by mouth every 8 (eight) hours as needed for pain (pain).    [provider]  albuterol (VENTOLIN HFA) 108 (90 Base) MCG/ACT inhaler Inhale 2 puffs into the lungs every 4 (four) hours as needed for wheezing or shortness of breath. Inhale 2 puffs into the lungs every 4 hours as needed 04/04/21   Rai, Vernelle Emerald, MD  ALPRAZolam Duanne Moron) 0.5 MG tablet Take 0.5 mg by mouth at bedtime as needed for anxiety.    [provider]  atorvastatin (LIPITOR) 40 MG tablet Take 1 tablet (40 mg total) by mouth daily. 01/21/21   Nahser, Wonda Cheng, MD  benzonatate (TESSALON) 200 MG capsule Take 1 capsule (200 mg  total) by mouth 3 (three) times daily as needed for cough. 04/21/21   Curt Bears, MD  Carbinoxamine Maleate 6 MG TABS Take 1 tablet by mouth 2 (two) times daily as needed (drainage). 03/24/21   Garnet Sierras, DO  chlorpheniramine-HYDROcodone (TUSSIONEX) 10-8 MG/5ML SUER Take 5 mLs by mouth every 12 (twelve) hours as needed for cough. 04/17/21   Garner Nash, DO  diclofenac Sodium (VOLTAREN) 1 % GEL Apply 4 g topically 4 (four) times daily. 02/27/21   Mikhail, Velta Addison, DO  dicyclomine (BENTYL) 20 MG tablet Take 20 mg by mouth as needed for spasms.    [provider]  diltiazem (CARDIZEM) 30 MG tablet Take 1 tablet (30 mg total) by mouth 4 (four) times daily as needed. 01/21/21   Nahser, Wonda Cheng, MD  dofetilide (TIKOSYN) 500 MCG capsule Take 1 capsule (500 mcg total) by mouth 2 (two) times daily. Please keep upcoming appointment in February for future refills. Thank you 01/21/21   Nahser, Wonda Cheng, MD  fenofibrate 160 MG tablet TAKE 1 TABLET DAILY. PLEASE KEEP UPCOMING APPT IN MARCH WITH DR. Acie Fredrickson BEFORE ANYMORE REFILLS. 01/21/21   Nahser, Wonda Cheng, MD  fluticasone (FLONASE) 50 MCG/ACT nasal spray Place 2 sprays into both  nostrils daily. 09/18/20   Garnet Sierras, DO  folic acid (FOLVITE) 1 MG tablet Take 1 tablet (1 mg total) by mouth daily. 03/18/21   Heilingoetter, Cassandra L, PA-C  ipratropium (ATROVENT) 0.03 % nasal spray Place 1-2 sprays into both nostrils every 12 (twelve) hours. For drainage 03/24/21   Garnet Sierras, DO  leuprolide, 6 Month, (ELIGARD) 45 MG injection Inject 45 mg into the skin every 6 (six) months.    [provider]  lidocaine-prilocaine (EMLA) cream Apply 1 application topically as needed. 04/07/21   Heilingoetter, Cassandra L, PA-C  loperamide (IMODIUM) 2 MG capsule Take 2 mg by mouth daily as needed for diarrhea or loose stools.    [provider]  magnesium oxide (MAG-OX) 400 MG tablet TAKE 1 TABLET BY MOUTH EVERY DAY 09/10/20   Allred, Jeneen Rinks, MD  metoprolol succinate (TOPROL XL) 25 MG 24 hr tablet Take 1 tablet (25 mg total) by mouth daily. 08/04/20   Thompson Grayer, MD  Multiple Vitamins-Minerals (MULTIVITAMINS THER. W/MINERALS) TABS Take 1 tablet by mouth daily.    [provider]  nitroGLYCERIN (NITROSTAT) 0.4 MG SL tablet Place 1 tablet (0.4 mg total) under the tongue every 5 (five) minutes as needed for chest pain. 01/21/21   Nahser, Wonda Cheng, MD  omeprazole (PRILOSEC) 20 MG capsule Take 1 capsule (20 mg total) by mouth daily. 03/24/21   Garnet Sierras, DO  ondansetron (ZOFRAN) 8 MG tablet Take 1 tablet (8 mg total) by mouth every 8 (eight) hours as needed for nausea or vomiting. Starting 3 days after chemotherapy 05/11/21   Curt Bears, MD  potassium chloride (KLOR-CON) 10 MEQ tablet Take 1 tablet (10 mEq total) by mouth daily. 10/23/20   Nahser, Wonda Cheng, MD  promethazine (PHENERGAN) 25 MG tablet Take 1 tablet (25 mg total) by mouth every 6 (six) hours as needed for nausea or vomiting. 03/25/21   Curt Bears, MD  Saccharomyces boulardii (PROBIOTIC) 250 MG CAPS Take 1 capsule by mouth daily.    [provider]  sucralfate (CARAFATE) 1 GM/10ML suspension  Take 10 mLs (1 g total) by mouth 4 (four) times daily. Before meals 03/25/21   Curt Bears, MD  tamsulosin (FLOMAX) 0.4 MG CAPS capsule Take 0.4 mg  by mouth daily.    [provider]  Tiotropium Bromide-Olodaterol (STIOLTO RESPIMAT) 2.5-2.5 MCG/ACT AERS Inhale 2 puffs into the lungs daily. 04/04/21   Rai, Vernelle Emerald, MD  topiramate (TOPAMAX) 50 MG tablet Take 2 tablets (100 mg total) by mouth 2 (two) times daily. 04/13/21   Frann Rider, NP  traMADol (ULTRAM) 50 MG tablet Take 1 tablet (50 mg total) by mouth every 6 (six) hours as needed for moderate pain. 02/27/21   Cristal Ford, DO  traZODone (DESYREL) 50 MG tablet Take 0.5 tablets (25 mg total) by mouth at bedtime as needed for sleep. 09/22/20   Frann Rider, NP  Vitamin D, Ergocalciferol, (DRISDOL) 1.25 MG (50000 UNIT) CAPS capsule TAKE 1 CAPSULE BY MOUTH ONE TIME PER WEEK 03/19/21   Brunetta Genera, MD  XARELTO 20 MG TABS tablet TAKE 1 TABLET BY MOUTH EVERY DAY 10/06/20   Nahser, Wonda Cheng, MD    Physical Exam: Vitals:   05/14/21 0801 05/14/21 0802 05/14/21 0804 05/14/21 0900  BP: 127/73 127/73  116/66  Pulse: (!) 101 100  (!) 102  Resp: 18 18  15   Temp: 98.5 F (36.9 C) 98.5 F (36.9 C)    TempSrc: Oral Oral    SpO2: 97% 98%  95%  Weight:   109.8 kg   Height:   6\' 2"  (1.88 m)     Constitutional: NAD, calm, comfortable, appears very pale, on room air, communicating well, obese Eyes: PERRL, lids and conjunctivae normal ENMT: Mucous membranes are moist. Posterior pharynx clear of any exudate or lesions.Normal dentition.  Neck: normal, supple, no masses, no thyromegaly Respiratory: clear to auscultation bilaterally, no wheezing, no crackles. Normal respiratory effort. No accessory muscle use.  Has port on right side of his upper chest. Cardiovascular: Regular rate and rhythm, no murmurs / rubs / gallops. No extremity edema. 2+ pedal pulses. No carotid bruits.  Abdomen: no tenderness, no masses palpated. No  hepatosplenomegaly. Bowel sounds positive.  Musculoskeletal: no clubbing / cyanosis. No joint deformity upper and lower extremities. Good ROM, no contractures. Normal muscle tone.  Skin: no rashes, lesions, ulcers. No induration Neurologic: CN 2-12 grossly intact. Sensation intact, DTR normal. Strength 5/5 in all 4.  Psychiatric: Normal judgment and insight. Alert and oriented x 3. Normal mood.    Labs on Admission: I have personally reviewed following labs and imaging studies  CBC: Recent Labs  Lab 05/11/21 1320 05/14/21 0842  WBC 10.3 32.0*  NEUTROABS 7.3  --   HGB 7.7* 6.7*  HCT 23.9* 21.3*  MCV 92.6 95.9  PLT 385 270   Basic Metabolic Panel: Recent Labs  Lab 05/11/21 1320 05/14/21 0842  NA 134* 131*  K 4.5 3.9  CL 103 100  CO2 23 21*  GLUCOSE 102* 104*  BUN 20 22  CREATININE 0.85 0.70  CALCIUM 9.0 8.5*   GFR: Estimated Creatinine Clearance: 108.4 mL/min (by C-G formula based on SCr of 0.7 mg/dL). Liver Function Tests: Recent Labs  Lab 05/11/21 1320  AST 16  ALT 11  ALKPHOS 60  BILITOT 0.3  PROT 6.2*  ALBUMIN 2.7*   No results for input(s): LIPASE, AMYLASE in the last 168 hours. No results for input(s): AMMONIA in the last 168 hours. Coagulation Profile: No results for input(s): INR, PROTIME in the last 168 hours. Cardiac Enzymes: No results for input(s): CKTOTAL, CKMB, CKMBINDEX, TROPONINI in the last 168 hours. BNP (last 3 results) No results for input(s): PROBNP in the last 8760 hours. HbA1C: No results for  input(s): HGBA1C in the last 72 hours. CBG: Recent Labs  Lab 05/14/21 0849  GLUCAP 106*   Lipid Profile: No results for input(s): CHOL, HDL, LDLCALC, TRIG, CHOLHDL, LDLDIRECT in the last 72 hours. Thyroid Function Tests: Recent Labs    05/11/21 1320  TSH 1.606   Anemia Panel: No results for input(s): VITAMINB12, FOLATE, FERRITIN, TIBC, IRON, RETICCTPCT in the last 72 hours. Urine analysis:    Component Value Date/Time   COLORURINE  YELLOW 05/14/2021 0842   APPEARANCEUR CLEAR 05/14/2021 0842   LABSPEC 1.014 05/14/2021 0842   PHURINE 6.0 05/14/2021 0842   GLUCOSEU NEGATIVE 05/14/2021 0842   HGBUR NEGATIVE 05/14/2021 0842   BILIRUBINUR NEGATIVE 05/14/2021 0842   KETONESUR NEGATIVE 05/14/2021 0842   PROTEINUR NEGATIVE 05/14/2021 0842   NITRITE NEGATIVE 05/14/2021 0842   LEUKOCYTESUR NEGATIVE 05/14/2021 0842    Radiological Exams on Admission: No results found.  EKG: Independently reviewed.  Normal sinus rhythm, prolonged QT interval.  Assessment/Plan Principal Problem:   Symptomatic anemia Active Problems:   Coronary artery disease   Benign essential HTN   Dyslipidemia   GERD (gastroesophageal reflux disease)   AF (paroxysmal atrial fibrillation) ([facility])   Non-small cell carcinoma of left lung, stage 4 ([facility])   Symptomatic anemia: -Chemotherapy-induced.   -Hemoglobin 6.7.  Transfuse 2 unit PRBC.  Occult blood negative -Repeat H&H after blood transfusion -He is maintaining oxygen saturation on room air. -Check iron studies  Paroxysmal A. fib: -Continue Cardizem and Tikosyn -Monitor on telemetry.  Hold Xarelto due to anemia  Primary hypertension: Continue metoprolol  Stage IV non-small cell carcinoma of left lung: -He finished radiation therapy and currently on chemo.  Last chemotherapy was on 05/11/2021 -Followed by oncology Dr. [name] outpatient  GERD: Continue Carafate and Protonix  Prolonged QT interval: Reviewed EKG. -Monitor on telemetry.  We will check magnesium.  Chronic hyponatremia: Sodium 131 -Patient is asymptomatic.  Continue to monitor  Leukocytosis: WBC of 32 -Likely due to chemo induced.  Patient is afebrile. -No signs of infection.  He is tachycardic likely due to anemia.  Former smoker History of asthma:-Continue home inhalers -Currently on room air.  Hyperlipidemia: Continue statin and fenofibrate  History of prostate cancer s/p radiation therapy: Continue  Flomax  Depression/anxiety: Continue home meds trazodone, Xanax  Obesity with BMI of 31: -Diet modification and exercise recommended  DVT prophylaxis: SCD Code Status: Full code Family Communication: None present at bedside.  Plan of care discussed with patient in length and he verbalized understanding and agreed with it. Disposition Plan: Likely home tomorrow Consults called: None Admission status: Observation   Mckinley Jewel MD [facility]  If 7PM-7AM, please contact night-coverage [email or web address]  05/14/2021, 9:45 AM

## 2021-05-14 NOTE — ED Provider Notes (Signed)
[facility] DEPT Provider Note   CSN: [id number] Arrival date & time: 05/14/21  0755     History Chief Complaint  Patient presents with   Weakness    Low hemoglobin    Corey [name] is a 75 y.o. male.  The history is provided by the patient and medical records. No language interpreter was used.  Weakness  75 year old male significant history of lung CA on chemo, anemia, CAD, diverticular disease, obesity, paroxysmal A. fib currently on Xarelto, prior stroke brought here via EMS from home with complaints of weakness.  Patient states that he received his third course of chemo 4 days ago.  Afterward he states he felt "wiped out" he became increasingly more fatigued, decrease in appetite, persistent nausea, and now even with small amount exertion he feels winded.  He did have blood work done after the chemo and was noted to have a hemoglobin of 7.7.  His oncologist, Dr. [name] was planning scheduling him for blood transfusion but next appointment is not until tomorrow.  This morning he felt extreme fatigue and request to be brought to the hospital for further care.  He did receive blood transfusion during his second course of chemo.  He denies any abnormal bleeding no rectal bleeding.  He is still on Xarelto.  Patient without significant chest pain or abdominal pain.  Past Medical History:  Diagnosis Date   Anemia    Anxiety    Arthritis    "back, right knee, hands, ankles, neck" (05/31/2016)   Carotid artery disease ([place]) 08/30/2016   Carotid US 11/19: R 1-39, L 40-59 // Carotid US 08/2019: R 1-39; L 40-59 // Carotid US 11/21: R 1-39; L 40-59>> repeat 1 year    Chronic lower back pain    Coronary atherosclerosis of native coronary artery    a. BMS to [facility] 2004 and 2007, otherwise mild nonobstructive disease. EF normal.   Diverticulitis    Dyslipidemia    Dyspnea    Essential hypertension, benign    Family history of breast cancer    Family history of  lung cancer    Family history of prostate cancer    GERD (gastroesophageal reflux disease)    Lumbar radiculopathy, chronic 02/04/2015   Right L5   Obesity    OSA on CPAP    uses cpap   Paroxysmal atrial fibrillation ([place])    a. Discovered after stroke.   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   COPD with acute exacerbation ([place]) 04/02/2021   Cough with hemoptysis 04/01/2021   CAP (community acquired pneumonia) 04/01/2021   Family history of prostate cancer 03/20/2021   Family history of breast cancer 03/20/2021   Family history of lung cancer 03/20/2021   Goals of care, counseling/discussion 03/18/2021   Encounter for antineoplastic chemotherapy 03/18/2021   Encounter for antineoplastic immunotherapy 03/18/2021   Primary cancer of left lower lobe of lung ([place]) 03/07/2021   Non-small cell carcinoma of left lung, stage 4 ([place]) 03/05/2021   Malnutrition of moderate degree 02/25/2021   Pleural effusion on left 02/24/2021   Hilar mass  Reactive airway disease 11/20/2020   Allergic rhinitis with non-allergic component 09/18/2020   Heartburn 09/18/2020   Coughing 09/18/2020   Malignant neoplasm of prostate ([place]) 03/04/2020   Pre-syncope 02/27/2020   Cerebral embolism with transient ischemic attack (TIA) 02/11/2020   History of stroke 47/06/2956   Embolic stroke involving cerebral artery ([place]) 02/11/2020   Secondary hypercoagulable state ([place]) 09/10/2019   Essential tremor 06/25/2019   Chest pain 05/27/2017   Demand ischemia ([place]) 05/27/2017   Morbid obesity ([place]) 05/27/2017   Carotid artery disease ([place]) 08/30/2016   Essential  hypertension 08/30/2016   Diverticulitis of intestine without perforation or abscess without bleeding    Hyponatremia 06/04/2016   Diverticulitis 05/31/2016   Iron deficiency 01/05/2016   Lumbar radiculopathy, chronic 02/04/2015   SVT (supraventricular tachycardia) ([place]) 12/03/2014   PAC (premature atrial contraction) 04/05/2014   OSA on CPAP 03/27/2014   AKI (acute kidney injury) ([place]) 12/05/2013   Nausea 12/05/2013   Sepsis ([place]) 12/05/2013   Abdominal pain 12/05/2013   AF (paroxysmal atrial fibrillation) ([place]) 01/05/2013   CVA (cerebral infarction) 07/06/2012   Coronary artery disease    Benign essential HTN    Dyslipidemia    GERD (gastroesophageal reflux disease)     Past Surgical History:  Procedure Laterality Date   ADENOIDECTOMY     ANTERIOR CERVICAL DECOMP/DISCECTOMY FUSION  07/2001; 10/2002   "C5-6; C6-7; redo"   BACK SURGERY     CARPAL TUNNEL RELEASE Left 10/2015   CHEST TUBE INSERTION Left 03/17/2021   Procedure: INSERTION PLEURAL DRAINAGE CATHETER;  Surgeon: Garner Nash, DO;  Location: [facility] ENDOSCOPY;  Service: Pulmonary;  Laterality: Left;  Indwelling Tunneled Pleural catheter (PLEUREX)    COLONOSCOPY W/ POLYPECTOMY  02/2014   CORONARY ANGIOPLASTY WITH STENT PLACEMENT  05/2003; 12/2005   "mid RCA; mid RCA"   FINE NEEDLE ASPIRATION  02/26/2021   Procedure: FINE NEEDLE ASPIRATION (FNA) LINEAR;  Surgeon: Candee Furbish, MD;  Location[facility] END[facility]COPY;  Service: Pulmonary;;   HAND SURGERY  02/2019   LEFT HAND   implantable loop recorder placement  02/27/2020   Medtronic Reveal [place] model [id number]  implantable loop recorder implanted by Dr Rayann Heman for afib management and evaluation of presyncope   IR IMAGING GUIDED PORT INSERTION  04/16/2021   JOINT REPLACEMENT     KNEE ARTHROSCOPY Left 10/2005   KNEE ARTHROSCOPY W/ PARTIAL MEDIAL MENISCECTOMY Left 09/2005   LUMBAR LAMINECTOMY/DECOMPRESSION MICRODISCECTOMY  03/2005   "L4-5"   POSTERIOR LUMBAR FUSION  10/2003    L5-S1; "plates, screws"   SHOULDER ARTHROSCOPY Right 08/2011   Debridement of labrum, arthroscopic distal clavicle excision   SHOULDER OPEN ROTATOR CUFF REPAIR Left 07/2014   TEE WITHOUT CARDIOVERSION  07/07/2012   Procedure: TRANSESOPHAGEAL ECHOCARDIOGRAM (TEE);  Surgeon: Fay Records, MD;  Location: [facility] ENDOSCOPY;  Service: Cardiovascular;  Laterality: N/A;   THORACENTESIS N/A 02/25/2021   Procedure: Mathews Robinsons;  Surgeon: Juanito Doom, MD;  Location: [facility];  Service: Cardiopulmonary;  Laterality: N/A;   TONSILLECTOMY AND ADENOIDECTOMY  ~ 1956   TOTAL KNEE ARTHROPLASTY Left 10/2006   TRIGGER FINGER RELEASE Left 10/2015   VIDEO BRONCHOSCOPY WITH ENDOBRONCHIAL ULTRASOUND Left 02/26/2021   Procedure: VIDEO BRONCHOSCOPY WITH ENDOBRONCHIAL ULTRASOUND;  Surgeon: Candee Furbish, MD;  Location: [facility] ENDOSCOPY;  Service: Pulmonary;  Laterality: Left;  cryoprobe too thanks!       Family History  Problem Relation Age of Onset   Hypertension Mother    Heart disease Father    Heart  attack Father    Prostate cancer Brother 39   Parkinson's disease Brother    Lung cancer Paternal Aunt        hx of smoking   Breast cancer Paternal Grandmother        dx 16s, bilateral mastectomies   Heart attack Paternal Grandfather    Blindness Son    Colon cancer Neg Hx    Pancreatic cancer Neg Hx     Social History   Tobacco Use   Smoking status: Former    Packs/day: 1.00    Years: 34.00    Pack years: 34.00    Types: Cigarettes    Quit date: 05/05/2003    Years since quitting: 18.0   Smokeless tobacco: Never  Vaping Use   Vaping Use: Never used  Substance Use Topics   Alcohol use: No    Alcohol/week: 0.0 standard drinks   Drug use: No    Home Medications Prior to Admission medications   Medication Sig Start Date End Date Taking? Authorizing Provider  acetaminophen (TYLENOL 8 HOUR ARTHRITIS PAIN) 650 MG CR tablet Take 1,300 mg by mouth every 8 (eight) hours as needed for pain  (pain).    [provider]  albuterol (VENTOLIN HFA) 108 (90 Base) MCG/ACT inhaler Inhale 2 puffs into the lungs every 4 (four) hours as needed for wheezing or shortness of breath. Inhale 2 puffs into the lungs every 4 hours as needed 04/04/21   Rai, Vernelle Emerald, MD  ALPRAZolam Duanne Moron) 0.5 MG tablet Take 0.5 mg by mouth at bedtime as needed for anxiety.    [provider]  atorvastatin (LIPITOR) 40 MG tablet Take 1 tablet (40 mg total) by mouth daily. 01/21/21   Nahser, Wonda Cheng, MD  benzonatate (TESSALON) 200 MG capsule Take 1 capsule (200 mg total) by mouth 3 (three) times daily as needed for cough. 04/21/21   Curt Bears, MD  Carbinoxamine Maleate 6 MG TABS Take 1 tablet by mouth 2 (two) times daily as needed (drainage). 03/24/21   Garnet Sierras, DO  chlorpheniramine-HYDROcodone (TUSSIONEX) 10-8 MG/5ML SUER Take 5 mLs by mouth every 12 (twelve) hours as needed for cough. 04/17/21   Garner Nash, DO  diclofenac Sodium (VOLTAREN) 1 % GEL Apply 4 g topically 4 (four) times daily. 02/27/21   Mikhail, Velta Addison, DO  dicyclomine (BENTYL) 20 MG tablet Take 20 mg by mouth as needed for spasms.    [provider]  diltiazem (CARDIZEM) 30 MG tablet Take 1 tablet (30 mg total) by mouth 4 (four) times daily as needed. 01/21/21   Nahser, Wonda Cheng, MD  dofetilide (TIKOSYN) 500 MCG capsule Take 1 capsule (500 mcg total) by mouth 2 (two) times daily. Please keep upcoming appointment in February for future refills. Thank you 01/21/21   Nahser, Wonda Cheng, MD  fenofibrate 160 MG tablet TAKE 1 TABLET DAILY. PLEASE KEEP UPCOMING APPT IN MARCH WITH DR. Acie Fredrickson BEFORE ANYMORE REFILLS. 01/21/21   Nahser, Wonda Cheng, MD  fluticasone (FLONASE) 50 MCG/ACT nasal spray Place 2 sprays into both nostrils daily. 09/18/20   Garnet Sierras, DO  folic acid (FOLVITE) 1 MG tablet Take 1 tablet (1 mg total) by mouth daily. 03/18/21   Heilingoetter, Cassandra L, PA-C  ipratropium (ATROVENT) 0.03 % nasal spray Place 1-2  sprays into both nostrils every 12 (twelve) hours. For drainage 03/24/21   Garnet Sierras, DO  leuprolide, 6 Month, (ELIGARD) 45 MG injection Inject 45 mg into the skin every 6 (six) months.  [provider]  lidocaine-prilocaine (EMLA) cream Apply 1 application topically as needed. 04/07/21   Heilingoetter, Cassandra L, PA-C  loperamide (IMODIUM) 2 MG capsule Take 2 mg by mouth daily as needed for diarrhea or loose stools.    [provider]  magnesium oxide (MAG-OX) 400 MG tablet TAKE 1 TABLET BY MOUTH EVERY DAY 09/10/20   Allred, Jeneen Rinks, MD  metoprolol succinate (TOPROL XL) 25 MG 24 hr tablet Take 1 tablet (25 mg total) by mouth daily. 08/04/20   Thompson Grayer, MD  Multiple Vitamins-Minerals (MULTIVITAMINS THER. W/MINERALS) TABS Take 1 tablet by mouth daily.    [provider]  nitroGLYCERIN (NITROSTAT) 0.4 MG SL tablet Place 1 tablet (0.4 mg total) under the tongue every 5 (five) minutes as needed for chest pain. 01/21/21   Nahser, Wonda Cheng, MD  omeprazole (PRILOSEC) 20 MG capsule Take 1 capsule (20 mg total) by mouth daily. 03/24/21   Garnet Sierras, DO  ondansetron (ZOFRAN) 8 MG tablet Take 1 tablet (8 mg total) by mouth every 8 (eight) hours as needed for nausea or vomiting. Starting 3 days after chemotherapy 05/11/21   Curt Bears, MD  potassium chloride (KLOR-CON) 10 MEQ tablet Take 1 tablet (10 mEq total) by mouth daily. 10/23/20   Nahser, Wonda Cheng, MD  promethazine (PHENERGAN) 25 MG tablet Take 1 tablet (25 mg total) by mouth every 6 (six) hours as needed for nausea or vomiting. 03/25/21   Curt Bears, MD  Saccharomyces boulardii (PROBIOTIC) 250 MG CAPS Take 1 capsule by mouth daily.    [provider]  sucralfate (CARAFATE) 1 GM/10ML suspension Take 10 mLs (1 g total) by mouth 4 (four) times daily. Before meals 03/25/21   Curt Bears, MD  tamsulosin (FLOMAX) 0.4 MG CAPS capsule Take 0.4 mg by mouth daily.    [provider]  Tiotropium  Bromide-Olodaterol (STIOLTO RESPIMAT) 2.5-2.5 MCG/ACT AERS Inhale 2 puffs into the lungs daily. 04/04/21   Rai, Vernelle Emerald, MD  topiramate (TOPAMAX) 50 MG tablet Take 2 tablets (100 mg total) by mouth 2 (two) times daily. 04/13/21   Frann Rider, NP  traMADol (ULTRAM) 50 MG tablet Take 1 tablet (50 mg total) by mouth every 6 (six) hours as needed for moderate pain. 02/27/21   Cristal Ford, DO  traZODone (DESYREL) 50 MG tablet Take 0.5 tablets (25 mg total) by mouth at bedtime as needed for sleep. 09/22/20   Frann Rider, NP  Vitamin D, Ergocalciferol, (DRISDOL) 1.25 MG (50000 UNIT) CAPS capsule TAKE 1 CAPSULE BY MOUTH ONE TIME PER WEEK 03/19/21   Brunetta Genera, MD  XARELTO 20 MG TABS tablet TAKE 1 TABLET BY MOUTH EVERY DAY 10/06/20   Nahser, Wonda Cheng, MD    Allergies    Amoxicillin, Fosinopril, Hydromorphone, Ampicillin, Monopril [fosinopril sodium], Other, and Rosuvastatin  Review of Systems   Review of Systems  Neurological:  Positive for weakness.  All other systems reviewed and are negative.  Physical Exam Updated Vital Signs BP 127/73 (BP Location: Right Arm)   Pulse 100   Temp 98.5 F (36.9 C) (Oral)   Resp 18   Ht 6' 2"  (1.88 m)   Wt 109.8 kg   SpO2 98%   BMI 31.07 kg/m   Physical Exam Vitals and nursing note reviewed.  Constitutional:      General: He is not in acute distress.    Appearance: He is well-developed.  HENT:     Head: Atraumatic.  Eyes:     Conjunctiva/sclera: Conjunctivae normal.  Cardiovascular:     Rate and Rhythm: Tachycardia present.     Pulses: Normal pulses.     Heart sounds: Normal heart sounds.  Pulmonary:     Effort: Pulmonary effort is normal.     Breath sounds: Normal breath sounds.  Abdominal:     Palpations: Abdomen is soft.     Tenderness: There is no abdominal tenderness.  Genitourinary:    Comments: Chaperone present during exam.  Normal rectal tone no obvious mass normal color stool on glove. Musculoskeletal:      Cervical back: Neck supple.     Comments: Global weakness with exertional tremors.  Skin:    Coloration: Skin is pale.     Findings: No rash.  Neurological:     Mental Status: He is alert and oriented to person, place, and time.  Psychiatric:        Mood and Affect: Mood normal.    ED Results / Procedures / Treatments   Labs (all labs ordered are listed, but only abnormal results are displayed) Labs Reviewed  BASIC METABOLIC PANEL - Abnormal; Notable for the following components:      Result Value   Sodium 131 (*)    CO2 21 (*)    Glucose, Bld 104 (*)    Calcium 8.5 (*)    All other components within normal limits  CBC - Abnormal; Notable for the following components:   WBC 32.0 (*)    RBC 2.22 (*)    Hemoglobin 6.7 (*)    HCT 21.3 (*)    RDW 20.1 (*)    All other components within normal limits  CBG MONITORING, ED - Abnormal; Notable for the following components:   Glucose-Capillary 106 (*)    All other components within normal limits  SARS CORONAVIRUS 2 (TAT 6-24 HRS)  URINALYSIS, ROUTINE W REFLEX MICROSCOPIC  CBC  CREATININE, SERUM  POC OCCULT BLOOD, ED  PREPARE RBC (CROSSMATCH)    EKG EKG Interpretation  Date/Time:  Thursday May 14 2021 08:53:36 EDT Ventricular Rate:  98 PR Interval:  191 QRS Duration: 94 QT Interval:  392 QTC Calculation: 501 R Axis:   59 Text Interpretation: Sinus rhythm Borderline T abnormalities, anterior leads: T wave inversions which appear new when compared to EKG from 04/02/21 Prolonged QT interval Axis normal Confirmed by Lorre Munroe ([id number]) on 05/14/2021 8:59:09 AM  Radiology No results found.  Procedures .Critical Care  Date/Time: 05/14/2021 9:47 AM Performed by: Domenic Moras, PA-C Authorized by: Domenic Moras, PA-C   Critical care provider statement:    Critical care time (minutes):  31   Critical care was time spent personally by me on the following activities:  Discussions with consultants, evaluation of patient's response  to treatment, examination of patient, ordering and performing treatments and interventions, ordering and review of laboratory studies, ordering and review of radiographic studies, pulse oximetry, re-evaluation of patient's condition, obtaining history from patient or surrogate and review of old charts   Medications Ordered in ED Medications  0.9 %  sodium chloride infusion (has no administration in time range)  enoxaparin (LOVENOX) injection 40 mg (has no administration in time range)  acetaminophen (TYLENOL) tablet 650 mg (has no administration in time range)    Or  acetaminophen (TYLENOL) suppository 650 mg (has no administration in time range)  ondansetron (ZOFRAN) tablet 4 mg (has no administration in time range)    Or  ondansetron (ZOFRAN) injection 4 mg (has no administration in time range)    ED Course  I have reviewed the triage vital signs and the nursing notes.  Pertinent labs & imaging results that were available during my care of the patient were reviewed by me and considered in my medical decision making (see chart for details).    MDM Rules/Calculators/A&P                           BP 116/66   Pulse (!) 102   Temp 98.5 F (36.9 C) (Oral)   Resp 15   Ht 6' 2"  (1.88 m)   Wt 109.8 kg   SpO2 95%   BMI 31.07 kg/m   Final Clinical Impression(s) / ED Diagnoses Final diagnoses:  Symptomatic anemia    Rx / DC Orders ED Discharge Orders     None      8:43 AM Patient here with generalized weakness and fatigue along with history of anemia requiring blood transfusion.  Recently had his third chemo course and was found to have a hemoglobin of 7.7 needing blood transfusion.  He would likely benefit from blood transfusion during this visit.  Anemia likely secondary to ongoing chemo treatment.  He is currently on Xarelto but did not report any abnormal rectal bleeding or other bleeding.  Care discussed with Dr Joya Gaskins  9:30 AM Hemoglobin is 6.7.  Will transfuse 2 unit  of packed red blood cells.  White count of 32 likely secondary to recent chemo treatment.  Fecal occult blood test is negative.  Will consult for admission.  9:44 AM Appreciate consultation from [facility] hospitalist, Dr. Doristine Bosworth who agrees to see and will admit patient for blood transfusion.  Patient is made aware of findings and agrees with plan.  Patient does endorse nausea however EKG shows a prolonged QT.  Discussed this with patient, he states that his nausea is currently somewhat controlled at this time.  He request for something to drink.   Domenic Moras, PA-C 05/14/21 1601    Arnaldo Natal, MD 05/14/21 [phone]

## 2021-05-14 NOTE — ED Triage Notes (Signed)
[facility] reports pt coming from home. Pt c/o severe weakness, states his Hgb was 7.7 recently. States he can barely walk, eat, sleep, just wore out. Pt w/hx of lung CA and receiving chemo for it.

## 2021-05-15 ENCOUNTER — Inpatient Hospital Stay: Payer: Medicare PPO

## 2021-05-15 DIAGNOSIS — D649 Anemia, unspecified: Secondary | ICD-10-CM | POA: Diagnosis not present

## 2021-05-15 LAB — TYPE AND SCREEN
ABO/RH(D): O POS
Antibody Screen: NEGATIVE
Unit division: 0
Unit division: 0

## 2021-05-15 LAB — CBC
HCT: 26.8 % — ABNORMAL LOW (ref 39.0–52.0)
Hemoglobin: 8.7 g/dL — ABNORMAL LOW (ref 13.0–17.0)
MCH: 29.4 pg (ref 26.0–34.0)
MCHC: 32.5 g/dL (ref 30.0–36.0)
MCV: 90.5 fL (ref 80.0–100.0)
Platelets: 275 10*3/uL (ref 150–400)
RBC: 2.96 MIL/uL — ABNORMAL LOW (ref 4.22–5.81)
RDW: 20.6 % — ABNORMAL HIGH (ref 11.5–15.5)
WBC: 24.5 10*3/uL — ABNORMAL HIGH (ref 4.0–10.5)
nRBC: 0.1 % (ref 0.0–0.2)

## 2021-05-15 LAB — COMPREHENSIVE METABOLIC PANEL
ALT: 17 U/L (ref 0–44)
AST: 24 U/L (ref 15–41)
Albumin: 2.8 g/dL — ABNORMAL LOW (ref 3.5–5.0)
Alkaline Phosphatase: 64 U/L (ref 38–126)
Anion gap: 9 (ref 5–15)
BUN: 16 mg/dL (ref 8–23)
CO2: 22 mmol/L (ref 22–32)
Calcium: 8.6 mg/dL — ABNORMAL LOW (ref 8.9–10.3)
Chloride: 100 mmol/L (ref 98–111)
Creatinine, Ser: 0.68 mg/dL (ref 0.61–1.24)
GFR, Estimated: >60 mL/min (ref >60)
Glucose, Bld: 113 mg/dL — ABNORMAL HIGH (ref 70–99)
Potassium: 3.7 mmol/L (ref 3.5–5.1)
Sodium: 131 mmol/L — ABNORMAL LOW (ref 135–145)
Total Bilirubin: 1 mg/dL (ref 0.3–1.2)
Total Protein: 6.1 g/dL — ABNORMAL LOW (ref 6.5–8.1)

## 2021-05-15 LAB — BPAM RBC
Blood Product Expiration Date: 202209142359
Blood Product Expiration Date: 202209142359
ISSUE DATE / TIME: 202208111220
ISSUE DATE / TIME: 202208111631
Unit Type and Rh: 5100
Unit Type and Rh: 5100

## 2021-05-15 MED ORDER — RIVAROXABAN 20 MG PO TABS
20.0000 mg | ORAL_TABLET | Freq: Every day | ORAL | Status: DC
  Administered 2021-05-15 – 2021-05-19 (×5): 20 mg via ORAL
  Filled 2021-05-15 (×5): qty 1

## 2021-05-15 NOTE — Progress Notes (Signed)
PROGRESS NOTE    Corey [name]  JSE:[id number] DOB: Apr 28, 1946 DOA: 05/14/2021 PCP: [name], MD     Brief Narrative:  Corey [name] is a 75 y.o. male with medical history significant of A. 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 His last chemo was on 05/11/2021 and had labs done after the chemo which shows hemoglobin of 7.7.  He was scheduled for blood transfusion however he felt extremely weak, fatigue with exertional dyspnea and nausea therefore he called EMS and came to the hospital for further evaluation and management.    New events last 24 hours / Subjective: He states that he continues to feel poorly, continues to be weak overall, decreased p.o. intake, does not feel ready to go home yet due to his weakness.  Assessment & Plan:   Principal Problem:   Symptomatic anemia Active Problems:   Coronary artery disease   Benign essential HTN   Dyslipidemia   GERD (gastroesophageal reflux disease)   AF (paroxysmal atrial fibrillation) (HCC)   Non-small cell carcinoma of left lung, stage 4 (HCC)   Symptomatic anemia, chemotherapy-induced -FOBT negative  -Status post 2 unit packed red blood cell transfusion -Hemoglobin remains stable this morning -Continues to feel very weak overall, PT ordered  Stage IV non-small cell lung cancer -Currently undergoing chemotherapy, followed by Dr. [name]  Paroxysmal atrial fibrillation -Continue Cardizem, metoprolol, Tikosyn, resume Xarelto  Chronic hyponatremia -Stable   Leukocytosis -Received pegfilgrastim 8/10   Hyperlipidemia -Continue lipitor, fenofibrate  History of prostate cancer status postradiation therapy -Continue Flomax  Depression/anxiety -Continue trazadone,  Xanax  OSA -CPAP qhs     DVT prophylaxis: Xarelto resume  SCDs Start: 05/14/21 0945  Code Status:     Code Status Orders  (From admission, onward)           Start     Ordered   05/14/21 0945  Full code  Continuous        05/14/21 0945           Code Status History     Date Active Date Inactive Code Status Order ID Comments User Context   04/01/2021 0948 04/04/2021 1632 DNR [id number]  Collier Bullock, MD ED   02/24/2021 1611 02/27/2021 1834 Full Code [id number]  Lequita Halt, MD ED   09/18/2019 1541 09/21/2019 1716 Full Code [id number]  Oliver Barre, PA Inpatient   05/27/2017 0259 05/27/2017 1426 Full Code [id number]  Doylene Canning, MD ED   06/04/2016 1609 06/10/2016 1529 Full Code [id number]  Chahn-Yang Dessa Phi, DO Inpatient   05/31/2016 2057 06/02/2016 1800 Full Code [id number]  Toy Baker, MD Inpatient   07/06/2012 0021 07/07/2012 2225 Full Code [id number]  Kalman Drape, MD ED      Advance Directive Documentation    Flowsheet Row Most Recent Value  Type of Advance Directive Healthcare Power of Attorney  Pre-existing out of facility DNR order (yellow form or pink MOST form) --  "MOST" Form in Place? --      Family Communication: None at bedside Disposition Plan:  Status is: Observation  The patient will require care spanning > 2 midnights and should be moved to inpatient because: Unsafe d/c plan  Dispo: The patient is from: Home              Anticipated d/c  is to: Home              Patient currently is not medically stable to d/c.   Difficult to place patient No      Consultants:  None  Procedures:  None   Antimicrobials:  Anti-infectives (From admission, onward)    None        Objective: Vitals:   05/15/21 0152 05/15/21 0344 05/15/21 0818 05/15/21 0821  BP: 122/76     Pulse: 86     Resp: (!) 21     Temp: 99.3 F (37.4 C)     TempSrc: Oral     SpO2: 96%  98% 98%  Weight:  110.5 kg    Height:        Intake/Output  Summary (Last 24 hours) at 05/15/2021 1042 Last data filed at 05/15/2021 0940 Gross per 24 hour  Intake 2712 ml  Output --  Net 2712 ml   Filed Weights   05/14/21 0804 05/15/21 0344  Weight: 109.8 kg 110.5 kg    Examination:  General exam: Appears calm and comfortable  Respiratory system: Clear to auscultation. Respiratory effort normal. No respiratory distress. No conversational dyspnea.  Cardiovascular system: S1 & S2 heard, RRR. No murmurs. No pedal edema. Gastrointestinal system: Abdomen is nondistended, soft and nontender. Normal bowel sounds heard. Central nervous system: Alert and oriented. No focal neurological deficits. Speech clear.  Extremities: Symmetric in appearance  Skin: No rashes, lesions or ulcers on exposed skin  Psychiatry: Judgement and insight appear normal. Mood & affect appropriate.   Data Reviewed: I have personally reviewed following labs and imaging studies  CBC: Recent Labs  Lab 05/11/21 1320 05/14/21 0842 05/14/21 2100 05/15/21 0541  WBC 10.3 32.0* 29.1* 24.5*  NEUTROABS 7.3  --   --   --   HGB 7.7* 6.7* 8.7* 8.7*  HCT 23.9* 21.3* 26.9* 26.8*  MCV 92.6 95.9 89.4 90.5  PLT 385 348 306 370   Basic Metabolic Panel: Recent Labs  Lab 05/11/21 1320 05/14/21 0842 05/15/21 0541  NA 134* 131* 131*  K 4.5 3.9 3.7  CL 103 100 100  CO2 23 21* 22  GLUCOSE 102* 104* 113*  BUN 20 22 16   CREATININE 0.85 0.70 0.68  CALCIUM 9.0 8.5* 8.6*  MG  --  1.8  --    GFR: Estimated Creatinine Clearance: 108.8 mL/min (by C-G formula based on SCr of 0.68 mg/dL). Liver Function Tests: Recent Labs  Lab 05/11/21 1320 05/15/21 0541  AST 16 24  ALT 11 17  ALKPHOS 60 64  BILITOT 0.3 1.0  PROT 6.2* 6.1*  ALBUMIN 2.7* 2.8*   No results for input(s): LIPASE, AMYLASE in the last 168 hours. No results for input(s): AMMONIA in the last 168 hours. Coagulation Profile: No results for input(s): INR, PROTIME in the last 168 hours. Cardiac Enzymes: No results for  input(s): CKTOTAL, CKMB, CKMBINDEX, TROPONINI in the last 168 hours. BNP (last 3 results) No results for input(s): PROBNP in the last 8760 hours. HbA1C: No results for input(s): HGBA1C in the last 72 hours. CBG: Recent Labs  Lab 05/14/21 0849  GLUCAP 106*   Lipid Profile: No results for input(s): CHOL, HDL, LDLCALC, TRIG, CHOLHDL, LDLDIRECT in the last 72 hours. Thyroid Function Tests: No results for input(s): TSH, T4TOTAL, FREET4, T3FREE, THYROIDAB in the last 72 hours. Anemia Panel: Recent Labs    05/14/21 0841  FERRITIN 577*  TIBC 331  IRON 100   Sepsis Labs: No results for input(s): PROCALCITON,  LATICACIDVEN in the last 168 hours.  Recent Results (from the past 240 hour(s))  SARS CORONAVIRUS 2 (TAT 6-24 HRS) Nasopharyngeal Nasopharyngeal Swab     Status: None   Collection Time: 05/14/21  9:38 AM   Specimen: Nasopharyngeal Swab  Result Value Ref Range Status   SARS Coronavirus 2 NEGATIVE NEGATIVE Final    Comment: (NOTE) SARS-CoV-2 target nucleic acids are NOT DETECTED.  The SARS-CoV-2 RNA is generally detectable in upper and lower respiratory specimens during the acute phase of infection. Negative results do not preclude SARS-CoV-2 infection, do not rule out co-infections with other pathogens, and should not be used as the sole basis for treatment or other patient management decisions. Negative results must be combined with clinical observations, patient history, and epidemiological information. The expected result is Negative.  Fact Sheet for Patients: SugarRoll.be  Fact Sheet for Healthcare Providers: https://www.woods-mathews.com/  This test is not yet approved or cleared by the [place] FDA and  has been authorized for detection and/or diagnosis of SARS-CoV-2 by FDA under an Emergency Use Authorization (EUA). This EUA will remain  in effect (meaning this test can be used) for the duration of the COVID-19  declaration under Se ction 564(b)(1) of the Act, 21 U.S.C. section 360bbb-3(b)(1), unless the authorization is terminated or revoked sooner.  Performed at [facility], [place] [address] [place], [place] [address]       Radiology Studies: No results found.    Scheduled Meds:  arformoterol  15 mcg Nebulization BID   And   umeclidinium bromide  1 puff Inhalation Daily   atorvastatin  40 mg Oral Daily   Chlorhexidine Gluconate Cloth  6 each Topical Daily   dofetilide  500 mcg Oral BID   fenofibrate  160 mg Oral Daily   fluticasone  2 spray Each Nare Daily   folic acid  1 mg Oral Daily   ipratropium  1-2 spray Each Nare BID   metoprolol succinate  25 mg Oral Daily   pantoprazole  40 mg Oral Daily   tamsulosin  0.4 mg Oral QPC supper   topiramate  100 mg Oral BID   Continuous Infusions:   LOS: 0 days      Time spent: 30 minutes   Dessa Phi, DO [facility] 05/15/2021, 10:42 AM   Available via Epic secure chat 7am-7pm After these hours, please refer to coverage provider listed on [email or web address]

## 2021-05-16 DIAGNOSIS — D649 Anemia, unspecified: Secondary | ICD-10-CM | POA: Diagnosis not present

## 2021-05-16 LAB — BASIC METABOLIC PANEL
Anion gap: 9 (ref 5–15)
BUN: 13 mg/dL (ref 8–23)
CO2: 21 mmol/L — ABNORMAL LOW (ref 22–32)
Calcium: 8.4 mg/dL — ABNORMAL LOW (ref 8.9–10.3)
Chloride: 98 mmol/L (ref 98–111)
Creatinine, Ser: 0.69 mg/dL (ref 0.61–1.24)
GFR, Estimated: >60 mL/min (ref >60)
Glucose, Bld: 100 mg/dL — ABNORMAL HIGH (ref 70–99)
Potassium: 3.6 mmol/L (ref 3.5–5.1)
Sodium: 128 mmol/L — ABNORMAL LOW (ref 135–145)

## 2021-05-16 LAB — CBC
HCT: 25.6 % — ABNORMAL LOW (ref 39.0–52.0)
Hemoglobin: 8.3 g/dL — ABNORMAL LOW (ref 13.0–17.0)
MCH: 28.9 pg (ref 26.0–34.0)
MCHC: 32.4 g/dL (ref 30.0–36.0)
MCV: 89.2 fL (ref 80.0–100.0)
Platelets: 214 10*3/uL (ref 150–400)
RBC: 2.87 MIL/uL — ABNORMAL LOW (ref 4.22–5.81)
RDW: 20 % — ABNORMAL HIGH (ref 11.5–15.5)
WBC: 14.4 10*3/uL — ABNORMAL HIGH (ref 4.0–10.5)
nRBC: 0 % (ref 0.0–0.2)

## 2021-05-16 MED ORDER — GLYCOPYRROLATE 1 MG PO TABS
2.0000 mg | ORAL_TABLET | Freq: Once | ORAL | Status: AC
  Administered 2021-05-16: 2 mg via ORAL
  Filled 2021-05-16: qty 2

## 2021-05-16 MED ORDER — POTASSIUM CHLORIDE CRYS ER 20 MEQ PO TBCR
40.0000 meq | EXTENDED_RELEASE_TABLET | Freq: Once | ORAL | Status: AC
  Administered 2021-05-16: 40 meq via ORAL
  Filled 2021-05-16: qty 2

## 2021-05-16 NOTE — Progress Notes (Addendum)
PROGRESS NOTE    Corey [name]  CZY:[id number] DOB: [birth year] DOA: 05/14/2021 PCP: [name], MD     Brief Narrative:  Corey [name] is a 75 y.o. male with medical history significant of A. 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 His last chemo was on 05/11/2021 and had labs done after the chemo which shows hemoglobin of 7.7.  He was scheduled for blood transfusion however he felt extremely weak, fatigue with exertional dyspnea and nausea therefore he called EMS and came to the hospital for further evaluation and management.    New events last 24 hours / Subjective: States that he continues to feel very weak.  Has been ambulating with a walker.  States he has not gotten much rest, has been up to urinate up to 6 times a night  Assessment & Plan:   Principal Problem:   Symptomatic anemia Active Problems:   Coronary artery disease   Benign essential HTN   Dyslipidemia   GERD (gastroesophageal reflux disease)   AF (paroxysmal atrial fibrillation) (HCC)   Non-small cell carcinoma of left lung, stage 4 (HCC)   Symptomatic anemia, chemotherapy-induced -FOBT negative  -Status post 2 unit packed red blood cell transfusion -Hemoglobin remains stable this morning -Continues to feel very weak overall, PT recommending SNF placement  Stage IV non-small cell lung cancer -Currently undergoing chemotherapy, followed by Dr. [name]  Paroxysmal atrial fibrillation -Continue Cardizem, metoprolol, Tikosyn, resume Xarelto  Chronic hyponatremia -Stable   Hypokalemia -Replace  Leukocytosis -Received pegfilgrastim 8/10  -Improving  Hyperlipidemia -Continue lipitor, fenofibrate  History of prostate cancer status postradiation therapy -Continue  Flomax  Depression/anxiety -Continue trazadone, Xanax  OSA -CPAP qhs     DVT prophylaxis:  SCDs Start: 05/14/21 0945 rivaroxaban (XARELTO) tablet 20 mg  Code Status:     Code Status Orders  (From admission, onward)           Start     Ordered   05/14/21 0945  Full code  Continuous        05/14/21 0945           Code Status History     Date Active Date Inactive Code Status Order ID Comments User Context   04/01/2021 0948 04/04/2021 1632 DNR [id number]  Collier Bullock, MD ED   02/24/2021 1611 02/27/2021 1834 Full Code [id number]  Lequita Halt, MD ED   09/18/2019 1541 09/21/2019 1716 Full Code [id number]  Oliver Barre, PA Inpatient   05/27/2017 0259 05/27/2017 1426 Full Code [id number]  Doylene Canning, MD ED   06/04/2016 1609 06/10/2016 1529 Full Code [id number]  Chahn-Yang Dessa Phi, DO Inpatient   05/31/2016 2057 06/02/2016 1800 Full Code [id number]  Toy Baker, MD Inpatient   07/06/2012 0021 07/07/2012 2225 Full Code [id number]  Kalman Drape, MD ED      Advance Directive Documentation    Flowsheet Row Most Recent Value  Type of Advance Directive Healthcare Power of Attorney  Pre-existing out of facility DNR order (yellow form or pink MOST form) --  "MOST" Form in Place? --      Family Communication: None at bedside Disposition Plan:  Status is: Observation  The patient will require care spanning > 2 midnights and should be moved to inpatient because: Unsafe d/c plan  Dispo: The patient is from: Home  Anticipated d/c is to: SNF              Patient currently is not medically stable to d/c.   Difficult to place patient No      Consultants:  None  Procedures:  None   Antimicrobials:  Anti-infectives (From admission, onward)    None        Objective: Vitals:   05/15/21 1426 05/15/21 2121 05/16/21 0553 05/16/21 0734  BP: 124/67 122/69 120/71   Pulse: 86 91 96   Resp: 16 18 18    Temp: 98 F (36.7 C) 98.8 F (37.1 C)  98.8 F (37.1 C)   TempSrc:  Oral Oral   SpO2: 100% 93% 94% 95%  Weight:   114.1 kg   Height:        Intake/Output Summary (Last 24 hours) at 05/16/2021 1238 Last data filed at 05/16/2021 1001 Gross per 24 hour  Intake 680 ml  Output --  Net 680 ml    Filed Weights   05/14/21 0804 05/15/21 0344 05/16/21 0553  Weight: 109.8 kg 110.5 kg 114.1 kg    Examination: General exam: Appears calm and comfortable  Respiratory system: Clear to auscultation. Respiratory effort normal. Cardiovascular system: S1 & S2 heard, RRR. No pedal edema. Gastrointestinal system: Abdomen is nondistended, soft and nontender. Normal bowel sounds heard. Central nervous system: Alert and oriented. Non focal exam. Speech clear  Extremities: Symmetric in appearance bilaterally  Skin: No rashes, lesions or ulcers on exposed skin  Psychiatry: Judgement and insight appear stable. Mood & affect appropriate.    Data Reviewed: I have personally reviewed following labs and imaging studies  CBC: Recent Labs  Lab 05/11/21 1320 05/14/21 0842 05/14/21 2100 05/15/21 0541 05/16/21 0518  WBC 10.3 32.0* 29.1* 24.5* 14.4*  NEUTROABS 7.3  --   --   --   --   HGB 7.7* 6.7* 8.7* 8.7* 8.3*  HCT 23.9* 21.3* 26.9* 26.8* 25.6*  MCV 92.6 95.9 89.4 90.5 89.2  PLT 385 348 306 275 546    Basic Metabolic Panel: Recent Labs  Lab 05/11/21 1320 05/14/21 0842 05/15/21 0541 05/16/21 0617  NA 134* 131* 131* 128*  K 4.5 3.9 3.7 3.6  CL 103 100 100 98  CO2 23 21* 22 21*  GLUCOSE 102* 104* 113* 100*  BUN 20 22 16 13   CREATININE 0.85 0.70 0.68 0.69  CALCIUM 9.0 8.5* 8.6* 8.4*  MG  --  1.8  --   --     GFR: Estimated Creatinine Clearance: 110.5 mL/min (by C-G formula based on SCr of 0.69 mg/dL). Liver Function Tests: Recent Labs  Lab 05/11/21 1320 05/15/21 0541  AST 16 24  ALT 11 17  ALKPHOS 60 64  BILITOT 0.3 1.0  PROT 6.2* 6.1*  ALBUMIN 2.7* 2.8*    No results for input(s): LIPASE, AMYLASE in the last 168  hours. No results for input(s): AMMONIA in the last 168 hours. Coagulation Profile: No results for input(s): INR, PROTIME in the last 168 hours. Cardiac Enzymes: No results for input(s): CKTOTAL, CKMB, CKMBINDEX, TROPONINI in the last 168 hours. BNP (last 3 results) No results for input(s): PROBNP in the last 8760 hours. HbA1C: No results for input(s): HGBA1C in the last 72 hours. CBG: Recent Labs  Lab 05/14/21 0849  GLUCAP 106*    Lipid Profile: No results for input(s): CHOL, HDL, LDLCALC, TRIG, CHOLHDL, LDLDIRECT in the last 72 hours. Thyroid Function Tests: No results for input(s): TSH, T4TOTAL, FREET4, T3FREE, THYROIDAB in the  last 72 hours. Anemia Panel: Recent Labs    05/14/21 0841  FERRITIN 577*  TIBC 331  IRON 100    Sepsis Labs: No results for input(s): PROCALCITON, LATICACIDVEN in the last 168 hours.  Recent Results (from the past 240 hour(s))  SARS CORONAVIRUS 2 (TAT 6-24 HRS) Nasopharyngeal Nasopharyngeal Swab     Status: None   Collection Time: 05/14/21  9:38 AM   Specimen: Nasopharyngeal Swab  Result Value Ref Range Status   SARS Coronavirus 2 NEGATIVE NEGATIVE Final    Comment: (NOTE) SARS-CoV-2 target nucleic acids are NOT DETECTED.  The SARS-CoV-2 RNA is generally detectable in upper and lower respiratory specimens during the acute phase of infection. Negative results do not preclude SARS-CoV-2 infection, do not rule out co-infections with other pathogens, and should not be used as the sole basis for treatment or other patient management decisions. Negative results must be combined with clinical observations, patient history, and epidemiological information. The expected result is Negative.  Fact Sheet for Patients: SugarRoll.be  Fact Sheet for Healthcare Providers: https://www.woods-mathews.com/  This test is not yet approved or cleared by the [place] FDA and  has been authorized for detection  and/or diagnosis of SARS-CoV-2 by FDA under an Emergency Use Authorization (EUA). This EUA will remain  in effect (meaning this test can be used) for the duration of the COVID-19 declaration under Se ction 564(b)(1) of the Act, 21 U.S.C. section 360bbb-3(b)(1), unless the authorization is terminated or revoked sooner.  Performed at [facility], [place] [address], [place], [place] [address]        Radiology Studies: No results found.    Scheduled Meds:  arformoterol  15 mcg Nebulization BID   And   umeclidinium bromide  1 puff Inhalation Daily   atorvastatin  40 mg Oral Daily   Chlorhexidine Gluconate Cloth  6 each Topical Daily   dofetilide  500 mcg Oral BID   fenofibrate  160 mg Oral Daily   fluticasone  2 spray Each Nare Daily   folic acid  1 mg Oral Daily   ipratropium  1-2 spray Each Nare BID   metoprolol succinate  25 mg Oral Daily   pantoprazole  40 mg Oral Daily   rivaroxaban  20 mg Oral Q supper   tamsulosin  0.4 mg Oral QPC supper   topiramate  100 mg Oral BID   Continuous Infusions:   LOS: 0 days      Time spent: 25 minutes   Dessa Phi, DO [facility] 05/16/2021, 12:38 PM   Available via Epic secure chat 7am-7pm After these hours, please refer to coverage provider listed on [email or web address]

## 2021-05-16 NOTE — NC FL2 (Signed)
[place] LEVEL OF CARE SCREENING TOOL     IDENTIFICATION  Patient Name: Corey [name] Birthdate: 1946/04/15 Sex: male Admission Date (Current Location): 05/14/2021  [facility] and [place] Number:      Facility and Address:  [facility],  [address] [place], [place]      Provider Number: [id number]  Attending Physician Name and Address:  Dessa Phi, DO  Relative Name and Phone Number:  Densel, Kronick [phone], K [phone], Jerilynn Mages [phone]    Current Level of Care: Hospital Recommended Level of Care: [place] Prior Approval Number:    Date Approved/Denied:   PASRR Number: [id number]  Discharge Plan: SNF    Current Diagnoses: Patient Active Problem List   Diagnosis Date Noted   Symptomatic anemia 05/14/2021   Port-A-Cath in place 04/27/2021   Genetic testing 37/90/2409   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   COPD with acute exacerbation ([place]) 04/02/2021   Cough with hemoptysis 04/01/2021   CAP (community acquired pneumonia) 04/01/2021   Family history of prostate cancer 03/20/2021   Family history of breast cancer 03/20/2021   Family history of lung cancer 03/20/2021   Goals of care, counseling/discussion 03/18/2021   Encounter for antineoplastic chemotherapy 03/18/2021   Encounter for antineoplastic immunotherapy 03/18/2021   Primary cancer of left lower lobe of lung ([place]) 03/07/2021   Non-small cell carcinoma of left lung, stage 4 ([place]) 03/05/2021   Malnutrition of moderate degree 02/25/2021   Pleural effusion on left 02/24/2021   Hilar mass    Reactive airway disease 11/20/2020   Allergic rhinitis with non-allergic component 09/18/2020   Heartburn 09/18/2020   Coughing 09/18/2020   Malignant neoplasm of prostate ([place]) 03/04/2020   Pre-syncope 02/27/2020   Cerebral embolism with transient ischemic attack (TIA) 02/11/2020   History of stroke 73/53/2992   Embolic stroke involving cerebral  artery ([place]) 02/11/2020   Secondary hypercoagulable state ([place]) 09/10/2019   Essential tremor 06/25/2019   Chest pain 05/27/2017   Demand ischemia ([place]) 05/27/2017   Morbid obesity ([place]) 05/27/2017   Carotid artery disease ([place]) 08/30/2016   Essential hypertension 08/30/2016   Diverticulitis of intestine without perforation or abscess without bleeding    Hyponatremia 06/04/2016   Diverticulitis 05/31/2016   Iron deficiency 01/05/2016   Lumbar radiculopathy, chronic 02/04/2015   SVT (supraventricular tachycardia) ([place]) 12/03/2014   PAC (premature atrial contraction) 04/05/2014   OSA on CPAP 03/27/2014   AKI (acute kidney injury) ([place]) 12/05/2013   Nausea 12/05/2013   Sepsis ([place]) 12/05/2013   Abdominal pain 12/05/2013   AF (paroxysmal atrial fibrillation) ([place]) 01/05/2013   CVA (cerebral infarction) 07/06/2012   Coronary artery disease    Benign essential HTN    Dyslipidemia    GERD (gastroesophageal reflux disease)     Orientation RESPIRATION BLADDER Height & Weight     Self, Time, Situation, Place  Normal Continent Weight: 114.1 kg Height:  6' 2" (188 cm)  BEHAVIORAL SYMPTOMS/MOOD NEUROLOGICAL BOWEL NUTRITION STATUS      Continent Diet (Regular)  AMBULATORY STATUS COMMUNICATION OF NEEDS Skin   Extensive Assist Verbally Normal                       Personal Care Assistance Level of Assistance  Bathing, Feeding, Dressing Bathing Assistance: Limited assistance Feeding assistance: Independent Dressing Assistance: Maximum assistance     Functional Limitations Info  Sight, Hearing, Speech Sight Info: Impaired Hearing Info: Adequate Speech Info: Adequate  SPECIAL CARE FACTORS FREQUENCY  PT (By licensed PT), OT (By licensed OT)     PT Frequency: x5 week OT Frequency: x5 week            Contractures Contractures Info: Not present    Additional Factors Info  Code Status, Allergies Code Status Info: FULL Allergies Info: Amoxicillin, Fosinopril,  Hydromorphone, Ampicillin, Monopril (Fosinopril Sodium), Other, Rosuvastatin           Current Medications (05/16/2021):  This is the current hospital active medication list Current Facility-Administered Medications  Medication Dose Route Frequency Provider Last Rate Last Admin   acetaminophen (TYLENOL) tablet 650 mg  650 mg Oral Q6H PRN Pahwani, Rinka R, MD       Or   acetaminophen (TYLENOL) suppository 650 mg  650 mg Rectal Q6H PRN Pahwani, Rinka R, MD       albuterol (VENTOLIN HFA) 108 (90 Base) MCG/ACT inhaler 2 puff  2 puff Inhalation Q4H PRN Pahwani, Rinka R, MD       ALPRAZolam (XANAX) tablet 0.5 mg  0.5 mg Oral QHS PRN Pahwani, Rinka R, MD   0.5 mg at 05/15/21 2124   arformoterol (BROVANA) nebulizer solution 15 mcg  15 mcg Nebulization BID Pahwani, Rinka R, MD   15 mcg at 05/16/21 0734   And   umeclidinium bromide (INCRUSE ELLIPTA) 62.5 MCG/INH 1 puff  1 puff Inhalation Daily Pahwani, Rinka R, MD   1 puff at 05/16/21 1015   atorvastatin (LIPITOR) tablet 40 mg  40 mg Oral Daily Pahwani, Rinka R, MD   40 mg at 05/16/21 1008   benzonatate (TESSALON) capsule 200 mg  200 mg Oral TID PRN Pahwani, Rinka R, MD   200 mg at 05/14/21 2207   Chlorhexidine Gluconate Cloth 2 % PADS 6 each  6 each Topical Daily Pahwani, Rinka R, MD   6 each at 05/15/21 1010   diltiazem (CARDIZEM) tablet 30 mg  30 mg Oral QID PRN Pahwani, Rinka R, MD       dofetilide (TIKOSYN) capsule 500 mcg  500 mcg Oral BID Pahwani, Rinka R, MD   500 mcg at 05/16/21 1011   fenofibrate tablet 160 mg  160 mg Oral Daily Pahwani, Rinka R, MD   160 mg at 05/16/21 1012   fluticasone (FLONASE) 50 MCG/ACT nasal spray 2 spray  2 spray Each Nare Daily Pahwani, Rinka R, MD   2 spray at 41/66/06 3016   folic acid (FOLVITE) tablet 1 mg  1 mg Oral Daily Pahwani, Rinka R, MD   1 mg at 05/16/21 1009   ipratropium (ATROVENT) 0.03 % nasal spray 1-2 spray  1-2 spray Each Nare BID Pahwani, Rinka R, MD   2 spray at 05/16/21 1017   metoprolol  succinate (TOPROL-XL) 24 hr tablet 25 mg  25 mg Oral Daily Pahwani, Rinka R, MD   25 mg at 05/16/21 1009   nitroGLYCERIN (NITROSTAT) SL tablet 0.4 mg  0.4 mg Sublingual Q5 min PRN Pahwani, Rinka R, MD       ondansetron (ZOFRAN) injection 4 mg  4 mg Intravenous Q8H PRN Blount, Xenia T, NP   4 mg at 05/16/21 1257   pantoprazole (PROTONIX) EC tablet 40 mg  40 mg Oral Daily Pahwani, Rinka R, MD   40 mg at 05/16/21 1010   rivaroxaban (XARELTO) tablet 20 mg  20 mg Oral Q supper Dessa Phi, DO   20 mg at 05/15/21 1735   sucralfate (CARAFATE) 1 GM/10ML suspension 1 g  1 g  Oral QID PRN Pahwani, Rinka R, MD       tamsulosin (FLOMAX) capsule 0.4 mg  0.4 mg Oral QPC supper Pahwani, Rinka R, MD   0.4 mg at 05/15/21 1735   topiramate (TOPAMAX) tablet 100 mg  100 mg Oral BID Pahwani, Rinka R, MD   100 mg at 05/16/21 1010   traMADol (ULTRAM) tablet 50 mg  50 mg Oral Q6H PRN Pahwani, Rinka R, MD       traZODone (DESYREL) tablet 25 mg  25 mg Oral QHS PRN Pahwani, Rinka R, MD         Discharge Medications: Please see discharge summary for a list of discharge medications.  Relevant Imaging Results:  Relevant Lab Results:   Additional Information SS# [id number]  Purcell Mouton, RN

## 2021-05-16 NOTE — Plan of Care (Signed)

## 2021-05-16 NOTE — TOC Progression Note (Signed)
Transition of Care [facility]) - Progression Note    Patient Details  Name: KYNG MATLOCK MRN: [id number] Date of Birth: 1946/05/23  Transition of Care [facility]) CM/SW Contact  Purcell Mouton, RN Phone Number: 05/16/2021, 2:40 PM  Clinical Narrative:     Spoke with pt's wife Renee concerning discharge plans.  Renee asked that pt go to SNF. Pt was faxed to SNF's.   Expected Discharge Plan: [place] Barriers to Discharge: No Barriers Identified  Expected Discharge Plan and Services Expected Discharge Plan: [place] arrangements for the past 2 months: Single Family Home                                       Social Determinants of Health (SDOH) Interventions    Readmission Risk Interventions No flowsheet data found.

## 2021-05-16 NOTE — Evaluation (Signed)
Physical Therapy Evaluation Patient Details Name: Corey [name] MRN: [id number] DOB: 09/12/1946 Today's Date: 05/16/2021   History of Present Illness  Brief Narrative:   Corey [name] is a 75 y.o. male with medical history significant of A. 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  Clinical Impression  Pt admitted with above diagnosis. Pt currently with functional limitations due to the deficits listed below (see PT Problem List). Pt will benefit from skilled PT to increase their independence and safety with mobility to allow discharge to the venue listed below.  Pt's HR up to 130 bpm and o2 94% on room air with 12' of gait with RW and min/guard A.  He lives with wife who works and has a Science writer of stairs to get to his bedroom and a full bath.  Based on this, recommend SNF at this time.  If for some reason he is unable to go to SNF, then would recommend maximized [facility] options with aides and therapy services, but do feel SNF is safest option for him.     Follow Up Recommendations SNF    Equipment Recommendations  None recommended by PT    Recommendations for Other Services       Precautions / Restrictions Precautions Precautions: Other (comment) Precaution Comments: monitor HR Restrictions Weight Bearing Restrictions: No      Mobility  Bed Mobility Overal bed mobility: Needs Assistance Bed Mobility: Supine to Sit     Supine to sit: Supervision;HOB elevated     General bed mobility comments: HOB at 30 degrees and used hand rail    Transfers Overall transfer level: Needs assistance Equipment used: Rolling walker (2 wheeled) Transfers: Sit to/from Stand Sit to Stand: Min guard         General transfer comment: cues for safe hand  placement  Ambulation/Gait   Gait Distance (Feet): 12 Feet (x2) Assistive device: Rolling walker (2 wheeled) Gait Pattern/deviations: Decreased step length - right;Decreased step length - left;Trunk flexed Gait velocity: decreased   General Gait Details: Amb from one side of the bed around to the bench at the window with RW and min/guard.  Sitting rest break HR 126 and o2 99%.  After 2 min rest ambulated back to recliner and HR 130 and o2 94% and pt feeling vey fatigued.  C/o weakness throughout.  Stairs            Wheelchair Mobility    Modified Rankin (Stroke Patients Only)       Balance Overall balance assessment: Mild deficits observed, not formally tested                                           Pertinent Vitals/Pain Pain Assessment: No/denies pain    Home Living Family/patient expects to be discharged to:: Private residence Living Arrangements: Spouse/significant other Available Help at Discharge: Family;Available PRN/intermittently Type of Home: House Home Access: Stairs to enter Entrance Stairs-Rails: None Entrance Stairs-Number of Steps: 2 Home Layout: Two level;Bed/bath upstairs;1/2 bath on main level Home Equipment: Walker - 4 wheels;Cane - single point;Shower seat      Prior Function Level of Independence: Needs assistance   Gait / Transfers Assistance Needed: Amb with rollator until his  last chemo 8/8 he has gone downhill           Hand Dominance        Extremity/Trunk Assessment                Communication   Communication: No difficulties  Cognition Arousal/Alertness: Awake/alert Behavior During Therapy: WFL for tasks assessed/performed Overall Cognitive Status: Within Functional Limits for tasks assessed                                        General Comments General comments (skin integrity, edema, etc.): fatigued quickly    Exercises     Assessment/Plan    PT Assessment Patient  needs continued PT services  PT Problem List Decreased strength;Decreased activity tolerance;Decreased mobility       PT Treatment Interventions DME instruction;Gait training;Functional mobility training;Therapeutic activities;Therapeutic exercise;Patient/family education    PT Goals (Current goals can be found in the Care Plan section)  Acute Rehab PT Goals Patient Stated Goal: Get stronger so he can be more independent PT Goal Formulation: With patient Time For Goal Achievement: 05/30/21 Potential to Achieve Goals: Good    Frequency Min 2X/week   Barriers to discharge Decreased caregiver support;Inaccessible home environment Wife works and lives in 2 level home with bedroom upstairs    Co-evaluation               AM-PAC PT "6 Clicks" Mobility  Outcome Measure Help needed turning from your back to your side while in a flat bed without using bedrails?: A Little Help needed moving from lying on your back to sitting on the side of a flat bed without using bedrails?: A Little Help needed moving to and from a bed to a chair (including a wheelchair)?: A Little Help needed standing up from a chair using your arms (e.g., wheelchair or bedside chair)?: A Little Help needed to walk in hospital room?: A Little Help needed climbing 3-5 steps with a railing? : A Little 6 Click Score: 18    End of Session Equipment Utilized During Treatment: Gait belt Activity Tolerance: Patient limited by fatigue Patient left: in chair;with call bell/phone within reach Nurse Communication: Mobility status PT Visit Diagnosis: Muscle weakness (generalized) (M62.81);Difficulty in walking, not elsewhere classified (R26.2)    Time: 1962-2297 PT Time Calculation (min) (ACUTE ONLY): 23 min   Charges:   PT Evaluation $PT Eval Moderate Complexity: 1 Mod PT Treatments $Gait Training: 8-22 mins        Corey [name], Corey [name]  05/16/2021   Corey [name] 05/16/2021, 9:50 AM

## 2021-05-17 DIAGNOSIS — D649 Anemia, unspecified: Secondary | ICD-10-CM | POA: Diagnosis not present

## 2021-05-17 LAB — CBC
HCT: 24.7 % — ABNORMAL LOW (ref 39.0–52.0)
Hemoglobin: 7.9 g/dL — ABNORMAL LOW (ref 13.0–17.0)
MCH: 28.6 pg (ref 26.0–34.0)
MCHC: 32 g/dL (ref 30.0–36.0)
MCV: 89.5 fL (ref 80.0–100.0)
Platelets: 144 10*3/uL — ABNORMAL LOW (ref 150–400)
RBC: 2.76 MIL/uL — ABNORMAL LOW (ref 4.22–5.81)
RDW: 19.8 % — ABNORMAL HIGH (ref 11.5–15.5)
WBC: 8.8 10*3/uL (ref 4.0–10.5)
nRBC: 0 % (ref 0.0–0.2)

## 2021-05-17 LAB — BASIC METABOLIC PANEL
Anion gap: 9 (ref 5–15)
BUN: 16 mg/dL (ref 8–23)
CO2: 22 mmol/L (ref 22–32)
Calcium: 8.4 mg/dL — ABNORMAL LOW (ref 8.9–10.3)
Chloride: 98 mmol/L (ref 98–111)
Creatinine, Ser: 0.73 mg/dL (ref 0.61–1.24)
GFR, Estimated: >60 mL/min (ref >60)
Glucose, Bld: 100 mg/dL — ABNORMAL HIGH (ref 70–99)
Potassium: 3.9 mmol/L (ref 3.5–5.1)
Sodium: 129 mmol/L — ABNORMAL LOW (ref 135–145)

## 2021-05-17 MED ORDER — HYDROCOD POLST-CPM POLST ER 10-8 MG/5ML PO SUER
5.0000 mL | Freq: Once | ORAL | Status: AC
Start: 2021-05-17 — End: 2021-05-17
  Administered 2021-05-17: 5 mL via ORAL
  Filled 2021-05-17: qty 5

## 2021-05-17 NOTE — Progress Notes (Addendum)
  Patient sat up in the chair for several hours, wife has been in visiting. They are concerned about his Hemoglobin coming down again. Today it was 7.9. Pleurex drained 50 ml. DSD reapplied by Clarene Critchley, RN.

## 2021-05-17 NOTE — Progress Notes (Signed)
PROGRESS NOTE    Corey [name]  DEY:[id number] DOB: 1946-07-16 DOA: 05/14/2021 PCP: [name], MD     Brief Narrative:  Corey [name] is a 75 y.o. male with medical history significant of A. 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 His last chemo was on 05/11/2021 and had labs done after the chemo which shows hemoglobin of 7.7.  He was scheduled for blood transfusion however he felt extremely weak, fatigue with exertional dyspnea and nausea therefore he called EMS and came to the hospital for further evaluation and management.  He received blood transfusions with slight improvement in symptoms but continued to be weak overall.   New events last 24 hours / Subjective: Was able to sit up in chair yesterday, agreeable with SNF placement for discharge.   Assessment & Plan:   Principal Problem:   Symptomatic anemia Active Problems:   Coronary artery disease   Benign essential HTN   Dyslipidemia   GERD (gastroesophageal reflux disease)   AF (paroxysmal atrial fibrillation) (HCC)   Non-small cell carcinoma of left lung, stage 4 (HCC)   Symptomatic anemia, chemotherapy-induced -FOBT negative  -Status post 2 unit packed red blood cell transfusion -Hemoglobin remains stable this morning -Continues to feel very weak overall, PT recommending SNF placement  Stage IV non-small cell lung cancer -Currently undergoing chemotherapy, followed by Dr. [name]  Paroxysmal atrial fibrillation -Continue Cardizem, metoprolol, Tikosyn, resume Xarelto  Chronic hyponatremia -Stable   Leukocytosis -Received pegfilgrastim 8/10  -Resolved   Hyperlipidemia -Continue lipitor, fenofibrate  History of prostate cancer status postradiation therapy -Continue  Flomax  Depression/anxiety -Continue trazadone, Xanax  OSA -CPAP qhs     DVT prophylaxis:  SCDs Start: 05/14/21 0945 rivaroxaban (XARELTO) tablet 20 mg  Code Status:     Code Status Orders  (From admission, onward)           Start     Ordered   05/14/21 0945  Full code  Continuous        05/14/21 0945           Code Status History     Date Active Date Inactive Code Status Order ID Comments User Context   04/01/2021 0948 04/04/2021 1632 DNR [id number]  Collier Bullock, MD ED   02/24/2021 1611 02/27/2021 1834 Full Code [id number]  Lequita Halt, MD ED   09/18/2019 1541 09/21/2019 1716 Full Code [id number]  Oliver Barre, PA Inpatient   05/27/2017 0259 05/27/2017 1426 Full Code [id number]  Doylene Canning, MD ED   06/04/2016 1609 06/10/2016 1529 Full Code [id number]  Chahn-Yang Dessa Phi, DO Inpatient   05/31/2016 2057 06/02/2016 1800 Full Code [id number]  Toy Baker, MD Inpatient   07/06/2012 0021 07/07/2012 2225 Full Code [id number]  Kalman Drape, MD ED      Advance Directive Documentation    Flowsheet Row Most Recent Value  Type of Advance Directive Healthcare Power of Attorney  Pre-existing out of facility DNR order (yellow form or pink MOST form) --  "MOST" Form in Place? --      Family Communication: None at bedside Disposition Plan:  Status is: Observation  The patient will require care spanning > 2 midnights and should be moved to inpatient because: Unsafe d/c plan  Dispo: The patient is from: Home  Anticipated d/c is to: SNF              Patient currently is not medically stable to d/c.   Difficult to place patient No      Consultants:  None  Procedures:  None   Antimicrobials:  Anti-infectives (From admission, onward)    None        Objective: Vitals:   05/17/21 0523 05/17/21 0528 05/17/21 0750 05/17/21 0752  BP: 99/72     Pulse: 89     Resp:      Temp: 98 F (36.7 C)     TempSrc: Oral     SpO2: 96%  93%  93%  Weight:  113.2 kg    Height:        Intake/Output Summary (Last 24 hours) at 05/17/2021 0855 Last data filed at 05/17/2021 0302 Gross per 24 hour  Intake 1200 ml  Output 300 ml  Net 900 ml    Filed Weights   05/15/21 0344 05/16/21 0553 05/17/21 0528  Weight: 110.5 kg 114.1 kg 113.2 kg    Examination: General exam: Appears calm and comfortable  Respiratory system: Clear to auscultation. Respiratory effort normal. Cardiovascular system: S1 & S2 heard, RRR. No pedal edema. Gastrointestinal system: Abdomen is nondistended, soft and nontender. Normal bowel sounds heard. Central nervous system: Alert and oriented. Non focal exam. Speech clear  Extremities: Symmetric in appearance bilaterally  Skin: No rashes, lesions or ulcers on exposed skin  Psychiatry: Judgement and insight appear stable. Mood & affect appropriate.   Data Reviewed: I have personally reviewed following labs and imaging studies  CBC: Recent Labs  Lab 05/11/21 1320 05/14/21 0842 05/14/21 2100 05/15/21 0541 05/16/21 0518 05/17/21 0528  WBC 10.3 32.0* 29.1* 24.5* 14.4* 8.8  NEUTROABS 7.3  --   --   --   --   --   HGB 7.7* 6.7* 8.7* 8.7* 8.3* 7.9*  HCT 23.9* 21.3* 26.9* 26.8* 25.6* 24.7*  MCV 92.6 95.9 89.4 90.5 89.2 89.5  PLT 385 348 306 275 214 144*    Basic Metabolic Panel: Recent Labs  Lab 05/11/21 1320 05/14/21 0842 05/15/21 0541 05/16/21 0617 05/17/21 0528  NA 134* 131* 131* 128* 129*  K 4.5 3.9 3.7 3.6 3.9  CL 103 100 100 98 98  CO2 23 21* 22 21* 22  GLUCOSE 102* 104* 113* 100* 100*  BUN 20 22 16 13 16   CREATININE 0.85 0.70 0.68 0.69 0.73  CALCIUM 9.0 8.5* 8.6* 8.4* 8.4*  MG  --  1.8  --   --   --     GFR: Estimated Creatinine Clearance: 110 mL/min (by C-G formula based on SCr of 0.73 mg/dL). Liver Function Tests: Recent Labs  Lab 05/11/21 1320 05/15/21 0541  AST 16 24  ALT 11 17  ALKPHOS 60 64  BILITOT 0.3 1.0  PROT 6.2* 6.1*  ALBUMIN 2.7* 2.8*    No results for  input(s): LIPASE, AMYLASE in the last 168 hours. No results for input(s): AMMONIA in the last 168 hours. Coagulation Profile: No results for input(s): INR, PROTIME in the last 168 hours. Cardiac Enzymes: No results for input(s): CKTOTAL, CKMB, CKMBINDEX, TROPONINI in the last 168 hours. BNP (last 3 results) No results for input(s): PROBNP in the last 8760 hours. HbA1C: No results for input(s): HGBA1C in the last 72 hours. CBG: Recent Labs  Lab 05/14/21 0849  GLUCAP 106*    Lipid Profile: No results for input(s): CHOL, HDL, LDLCALC, TRIG, CHOLHDL, LDLDIRECT in the  last 72 hours. Thyroid Function Tests: No results for input(s): TSH, T4TOTAL, FREET4, T3FREE, THYROIDAB in the last 72 hours. Anemia Panel: No results for input(s): VITAMINB12, FOLATE, FERRITIN, TIBC, IRON, RETICCTPCT in the last 72 hours.  Sepsis Labs: No results for input(s): PROCALCITON, LATICACIDVEN in the last 168 hours.  Recent Results (from the past 240 hour(s))  SARS CORONAVIRUS 2 (TAT 6-24 HRS) Nasopharyngeal Nasopharyngeal Swab     Status: None   Collection Time: 05/14/21  9:38 AM   Specimen: Nasopharyngeal Swab  Result Value Ref Range Status   SARS Coronavirus 2 NEGATIVE NEGATIVE Final    Comment: (NOTE) SARS-CoV-2 target nucleic acids are NOT DETECTED.  The SARS-CoV-2 RNA is generally detectable in upper and lower respiratory specimens during the acute phase of infection. Negative results do not preclude SARS-CoV-2 infection, do not rule out co-infections with other pathogens, and should not be used as the sole basis for treatment or other patient management decisions. Negative results must be combined with clinical observations, patient history, and epidemiological information. The expected result is Negative.  Fact Sheet for Patients: SugarRoll.be  Fact Sheet for Healthcare Providers: https://www.woods-mathews.com/  This test is not yet approved or  cleared by the [place] FDA and  has been authorized for detection and/or diagnosis of SARS-CoV-2 by FDA under an Emergency Use Authorization (EUA). This EUA will remain  in effect (meaning this test can be used) for the duration of the COVID-19 declaration under Se ction 564(b)(1) of the Act, 21 U.S.C. section 360bbb-3(b)(1), unless the authorization is terminated or revoked sooner.  Performed at [facility], [place] [address] [place], [place] [address]        Radiology Studies: No results found.    Scheduled Meds:  arformoterol  15 mcg Nebulization BID   And   umeclidinium bromide  1 puff Inhalation Daily   atorvastatin  40 mg Oral Daily   Chlorhexidine Gluconate Cloth  6 each Topical Daily   dofetilide  500 mcg Oral BID   fenofibrate  160 mg Oral Daily   fluticasone  2 spray Each Nare Daily   folic acid  1 mg Oral Daily   ipratropium  1-2 spray Each Nare BID   metoprolol succinate  25 mg Oral Daily   pantoprazole  40 mg Oral Daily   rivaroxaban  20 mg Oral Q supper   tamsulosin  0.4 mg Oral QPC supper   topiramate  100 mg Oral BID   Continuous Infusions:   LOS: 0 days      Time spent: 20  minutes   Dessa Phi, DO [facility] 05/17/2021, 8:55 AM   Available via Epic secure chat 7am-7pm After these hours, please refer to coverage provider listed on [email or web address]

## 2021-05-18 ENCOUNTER — Inpatient Hospital Stay: Payer: Medicare PPO

## 2021-05-18 ENCOUNTER — Telehealth: Payer: Self-pay | Admitting: Pulmonary Disease

## 2021-05-18 DIAGNOSIS — D649 Anemia, unspecified: Secondary | ICD-10-CM | POA: Diagnosis not present

## 2021-05-18 LAB — BASIC METABOLIC PANEL
Anion gap: 8 (ref 5–15)
BUN: 18 mg/dL (ref 8–23)
CO2: 23 mmol/L (ref 22–32)
Calcium: 8.5 mg/dL — ABNORMAL LOW (ref 8.9–10.3)
Chloride: 99 mmol/L (ref 98–111)
Creatinine, Ser: 0.75 mg/dL (ref 0.61–1.24)
GFR, Estimated: >60 mL/min (ref >60)
Glucose, Bld: 99 mg/dL (ref 70–99)
Potassium: 3.7 mmol/L (ref 3.5–5.1)
Sodium: 130 mmol/L — ABNORMAL LOW (ref 135–145)

## 2021-05-18 LAB — CBC
HCT: 24.3 % — ABNORMAL LOW (ref 39.0–52.0)
Hemoglobin: 7.8 g/dL — ABNORMAL LOW (ref 13.0–17.0)
MCH: 28.4 pg (ref 26.0–34.0)
MCHC: 32.1 g/dL (ref 30.0–36.0)
MCV: 88.4 fL (ref 80.0–100.0)
Platelets: 133 10*3/uL — ABNORMAL LOW (ref 150–400)
RBC: 2.75 MIL/uL — ABNORMAL LOW (ref 4.22–5.81)
RDW: 19.5 % — ABNORMAL HIGH (ref 11.5–15.5)
WBC: 8.5 10*3/uL (ref 4.0–10.5)
nRBC: 0 % (ref 0.0–0.2)

## 2021-05-18 LAB — PREPARE RBC (CROSSMATCH)

## 2021-05-18 LAB — PSA: Prostatic Specific Antigen: 0.01 ng/mL (ref 0.00–4.00)

## 2021-05-18 LAB — HEMOGLOBIN AND HEMATOCRIT, BLOOD
HCT: 26 % — ABNORMAL LOW (ref 39.0–52.0)
Hemoglobin: 8.6 g/dL — ABNORMAL LOW (ref 13.0–17.0)

## 2021-05-18 MED ORDER — POTASSIUM CHLORIDE CRYS ER 10 MEQ PO TBCR
10.0000 meq | EXTENDED_RELEASE_TABLET | Freq: Every day | ORAL | Status: DC
  Administered 2021-05-19 – 2021-05-20 (×2): 10 meq via ORAL
  Filled 2021-05-18 (×2): qty 1

## 2021-05-18 MED ORDER — POTASSIUM CHLORIDE CRYS ER 20 MEQ PO TBCR
40.0000 meq | EXTENDED_RELEASE_TABLET | Freq: Once | ORAL | Status: AC
  Administered 2021-05-18: 40 meq via ORAL
  Filled 2021-05-18: qty 2

## 2021-05-18 MED ORDER — HYDROCOD POLST-CPM POLST ER 10-8 MG/5ML PO SUER
5.0000 mL | Freq: Every evening | ORAL | Status: DC | PRN
  Administered 2021-05-18 – 2021-05-19 (×2): 5 mL via ORAL
  Filled 2021-05-18 (×2): qty 5

## 2021-05-18 MED ORDER — SODIUM CHLORIDE 0.9% IV SOLUTION: Freq: Once | INTRAVENOUS | Status: AC

## 2021-05-18 MED ORDER — ENSURE ENLIVE PO LIQD
237.0000 mL | Freq: Two times a day (BID) | ORAL | Status: DC
  Administered 2021-05-20: 237 mL via ORAL

## 2021-05-18 NOTE — Progress Notes (Signed)
Physical Therapy Treatment Patient Details Name: Corey [name] MRN: [id number] DOB: 18-Nov-1945 Today's Date: 05/18/2021    History of Present Illness Brief Narrative:   Corey [name] is a 75 y.o. male with medical history significant of A. 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    PT Comments    Progressing with mobility. Pt continues to exhibit weakness, decreased activity tolerance, and unsteadiness. He stated he felt a little better the more we did. He also stated he is supposed to have a blood transfusion sometime today. Will continue to follow and progress activity as tolerated.     Follow Up Recommendations  SNF     Equipment Recommendations  None recommended by PT    Recommendations for Other Services       Precautions / Restrictions Precautions Precautions: Fall Restrictions Weight Bearing Restrictions: No    Mobility  Bed Mobility               General bed mobility comments: oob in recliner    Transfers Overall transfer level: Needs assistance Equipment used: 4-wheeled walker Transfers: Sit to/from Stand Sit to Stand: Min guard         General transfer comment: Cues for safety, slow rise, hand placement, proper operation of rollator. Pt was shaky and dyspneic on 1st stand. After sitting and resting a bit, he was able to proceed with ambulation  Ambulation/Gait Ambulation/Gait assistance: Min guard Gait Distance (Feet): 50 Feet (25'x1; 50'x1) Assistive device: 4-wheeled walker Gait Pattern/deviations: Step-through pattern;Decreased stride length     General Gait Details: Shaky. Fatigues fairly easily. 2 seated rest breaks during walk. O2 92% on RA, HR 70 bpm. Dyspnea 2/4   Stairs              Wheelchair Mobility    Modified Rankin (Stroke Patients Only)       Balance Overall balance assessment: Needs assistance         Standing balance support: Bilateral upper extremity supported Standing balance-Leahy Scale: Fair                              Cognition Arousal/Alertness: Awake/alert Behavior During Therapy: WFL for tasks assessed/performed Overall Cognitive Status: Within Functional Limits for tasks assessed                                        Exercises      General Comments        Pertinent Vitals/Pain Pain Assessment: No/denies pain    Home Living                      Prior Function            PT Goals (current goals can now be found in the care plan section) Progress towards PT goals: Progressing toward goals    Frequency    Min 2X/week      PT Plan Current plan remains appropriate    Co-evaluation              AM-PAC PT "6 Clicks" Mobility   Outcome Measure  Help needed turning from your back to your side  while in a flat bed without using bedrails?: A Little Help needed moving from lying on your back to sitting on the side of a flat bed without using bedrails?: A Little Help needed moving to and from a bed to a chair (including a wheelchair)?: A Little Help needed standing up from a chair using your arms (e.g., wheelchair or bedside chair)?: A Little Help needed to walk in hospital room?: A Little Help needed climbing 3-5 steps with a railing? : A Little 6 Click Score: 18    End of Session Equipment Utilized During Treatment: Gait belt Activity Tolerance: Patient limited by fatigue Patient left: in chair;with call bell/phone within reach   PT Visit Diagnosis: Difficulty in walking, not elsewhere classified (R26.2);Muscle weakness (generalized) (M62.81)     Time: 8811-0315 PT Time Calculation (min) (ACUTE ONLY): 20 min  Charges:  $Gait Training: 8-22 mins                         Doreatha Massed, PT Acute Rehabilitation  Office: [phone] Pager: [phone]

## 2021-05-18 NOTE — Progress Notes (Signed)
PROGRESS NOTE    Corey [name]  TSV:[id number] DOB: 08-29-1946 DOA: 05/14/2021 PCP: [name], MD     Brief Narrative:  Corey [name] is a 75 y.o. male with medical history significant of A. 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 His last chemo was on 05/11/2021 and had labs done after the chemo which shows hemoglobin of 7.7.  He was scheduled for blood transfusion however he felt extremely weak, fatigue with exertional dyspnea and nausea therefore he called EMS and came to the hospital for further evaluation and management.  He received blood transfusions with slight improvement in symptoms but continued to be weak overall.   New events last 24 hours / Subjective: Concerned about his blood count, continues to feel very weak with decreased activity tolerance.  Wondering if he should "tank up" since he has another chemotherapy treatment scheduled for end of August.  Also asking to check his PSA while he is here, he is supposed to get this done today at outpatient urology  Assessment & Plan:   Principal Problem:   Symptomatic anemia Active Problems:   Coronary artery disease   Benign essential HTN   Dyslipidemia   GERD (gastroesophageal reflux disease)   AF (paroxysmal atrial fibrillation) (HCC)   Non-small cell carcinoma of left lung, stage 4 (HCC)   Symptomatic anemia, chemotherapy-induced -FOBT negative  -Status post 2 unit packed red blood cell transfusion -Transfuse additional unit of blood today to maintain goal >8 -Continues to feel very weak overall, PT recommending SNF placement  Stage IV non-small cell lung cancer -Currently undergoing chemotherapy, followed by Dr. [name]  Paroxysmal atrial fibrillation -Continue Cardizem,  metoprolol, Tikosyn, resume Xarelto  Chronic hyponatremia -Stable   Leukocytosis -Received pegfilgrastim 8/10  -Resolved   Hyperlipidemia -Continue lipitor, fenofibrate  History of prostate cancer status postradiation therapy -Continue Flomax  Depression/anxiety -Continue trazadone, Xanax  OSA -CPAP qhs     DVT prophylaxis:  SCDs Start: 05/14/21 0945 rivaroxaban (XARELTO) tablet 20 mg  Code Status:     Code Status Orders  (From admission, onward)           Start     Ordered   05/14/21 0945  Full code  Continuous        05/14/21 0945           Code Status History     Date Active Date Inactive Code Status Order ID Comments User Context   04/01/2021 0948 04/04/2021 1632 DNR [id number]  Collier Bullock, MD ED   02/24/2021 1611 02/27/2021 1834 Full Code [id number]  Lequita Halt, MD ED   09/18/2019 1541 09/21/2019 1716 Full Code [id number]  Oliver Barre, PA Inpatient   05/27/2017 0259 05/27/2017 1426 Full Code [id number]  Doylene Canning, MD ED   06/04/2016 1609 06/10/2016 1529 Full Code [id number]  Chahn-Yang Dessa Phi, DO Inpatient   05/31/2016 2057 06/02/2016 1800 Full Code [id number]  Toy Baker, MD Inpatient   07/06/2012 0021 07/07/2012 2225 Full Code [id number]  Kalman Drape, MD ED      Advance Directive Documentation    Flowsheet Row Most Recent Value  Type of Advance Directive Healthcare Power of Attorney  Pre-existing out of facility DNR order (yellow form or pink MOST form) --  "MOST" Form in Place? --  Family Communication: None at bedside Disposition Plan:  Status is: Observation  The patient will require care spanning > 2 midnights and should be moved to inpatient because: Unsafe d/c plan and Inpatient level of care appropriate due to severity of illness  Dispo: The patient is from: Home              Anticipated d/c is to: SNF              Patient currently is not medically stable to d/c.  Transfuse another unit of blood today  due to symptomatic anemia to goal of >8.  SNF placement is pending.  Hopefully can discharge 8/16.   Difficult to place patient No          Consultants:  None  Procedures:  None   Antimicrobials:  Anti-infectives (From admission, onward)    None        Objective: Vitals:   05/18/21 0549 05/18/21 0745 05/18/21 0751 05/18/21 1040  BP:    119/70  Pulse:    (!) 106  Resp:      Temp:      TempSrc:      SpO2:  98% 99%   Weight: 113.2 kg     Height:        Intake/Output Summary (Last 24 hours) at 05/18/2021 1217 Last data filed at 05/18/2021 0959 Gross per 24 hour  Intake 354 ml  Output 50 ml  Net 304 ml    Filed Weights   05/16/21 0553 05/17/21 0528 05/18/21 0549  Weight: 114.1 kg 113.2 kg 113.2 kg    Examination: General exam: Appears calm and comfortable  Respiratory system: Clear to auscultation. Respiratory effort normal. Cardiovascular system: S1 & S2 heard, RRR. No pedal edema. Gastrointestinal system: Abdomen is nondistended, soft and nontender. Normal bowel sounds heard. Central nervous system: Alert and oriented. Non focal exam. Speech clear  Extremities: Symmetric in appearance bilaterally  Skin: No rashes, lesions or ulcers on exposed skin  Psychiatry: Judgement and insight appear stable. Mood & affect appropriate.  Tearful today   Data Reviewed: I have personally reviewed following labs and imaging studies  CBC: Recent Labs  Lab 05/11/21 1320 05/14/21 0842 05/14/21 2100 05/15/21 0541 05/16/21 0518 05/17/21 0528 05/18/21 0544  WBC 10.3   < > 29.1* 24.5* 14.4* 8.8 8.5  NEUTROABS 7.3  --   --   --   --   --   --   HGB 7.7*   < > 8.7* 8.7* 8.3* 7.9* 7.8*  HCT 23.9*   < > 26.9* 26.8* 25.6* 24.7* 24.3*  MCV 92.6   < > 89.4 90.5 89.2 89.5 88.4  PLT 385   < > 306 275 214 144* 133*   < > = values in this interval not displayed.    Basic Metabolic Panel: Recent Labs  Lab 05/14/21 0842 05/15/21 0541 05/16/21 0617 05/17/21 0528  05/18/21 0544  NA 131* 131* 128* 129* 130*  K 3.9 3.7 3.6 3.9 3.7  CL 100 100 98 98 99  CO2 21* 22 21* 22 23  GLUCOSE 104* 113* 100* 100* 99  BUN 22 16 13 16 18   CREATININE 0.70 0.68 0.69 0.73 0.75  CALCIUM 8.5* 8.6* 8.4* 8.4* 8.5*  MG 1.8  --   --   --   --     GFR: Estimated Creatinine Clearance: 110 mL/min (by C-G formula based on SCr of 0.75 mg/dL). Liver Function Tests: Recent Labs  Lab 05/11/21 1320 05/15/21 0541  AST 16 24  ALT 11 17  ALKPHOS 60 64  BILITOT 0.3 1.0  PROT 6.2* 6.1*  ALBUMIN 2.7* 2.8*    No results for input(s): LIPASE, AMYLASE in the last 168 hours. No results for input(s): AMMONIA in the last 168 hours. Coagulation Profile: No results for input(s): INR, PROTIME in the last 168 hours. Cardiac Enzymes: No results for input(s): CKTOTAL, CKMB, CKMBINDEX, TROPONINI in the last 168 hours. BNP (last 3 results) No results for input(s): PROBNP in the last 8760 hours. HbA1C: No results for input(s): HGBA1C in the last 72 hours. CBG: Recent Labs  Lab 05/14/21 0849  GLUCAP 106*    Lipid Profile: No results for input(s): CHOL, HDL, LDLCALC, TRIG, CHOLHDL, LDLDIRECT in the last 72 hours. Thyroid Function Tests: No results for input(s): TSH, T4TOTAL, FREET4, T3FREE, THYROIDAB in the last 72 hours. Anemia Panel: No results for input(s): VITAMINB12, FOLATE, FERRITIN, TIBC, IRON, RETICCTPCT in the last 72 hours.  Sepsis Labs: No results for input(s): PROCALCITON, LATICACIDVEN in the last 168 hours.  Recent Results (from the past 240 hour(s))  SARS CORONAVIRUS 2 (TAT 6-24 HRS) Nasopharyngeal Nasopharyngeal Swab     Status: None   Collection Time: 05/14/21  9:38 AM   Specimen: Nasopharyngeal Swab  Result Value Ref Range Status   SARS Coronavirus 2 NEGATIVE NEGATIVE Final    Comment: (NOTE) SARS-CoV-2 target nucleic acids are NOT DETECTED.  The SARS-CoV-2 RNA is generally detectable in upper and lower respiratory specimens during the acute phase of  infection. Negative results do not preclude SARS-CoV-2 infection, do not rule out co-infections with other pathogens, and should not be used as the sole basis for treatment or other patient management decisions. Negative results must be combined with clinical observations, patient history, and epidemiological information. The expected result is Negative.  Fact Sheet for Patients: SugarRoll.be  Fact Sheet for Healthcare Providers: https://www.woods-mathews.com/  This test is not yet approved or cleared by the [place] FDA and  has been authorized for detection and/or diagnosis of SARS-CoV-2 by FDA under an Emergency Use Authorization (EUA). This EUA will remain  in effect (meaning this test can be used) for the duration of the COVID-19 declaration under Se ction 564(b)(1) of the Act, 21 U.S.C. section 360bbb-3(b)(1), unless the authorization is terminated or revoked sooner.  Performed at [facility], [place] [address], [place], [place] [address]        Radiology Studies: No results found.    Scheduled Meds:  sodium chloride   Intravenous Once   arformoterol  15 mcg Nebulization BID   And   umeclidinium bromide  1 puff Inhalation Daily   atorvastatin  40 mg Oral Daily   Chlorhexidine Gluconate Cloth  6 each Topical Daily   dofetilide  500 mcg Oral BID   fenofibrate  160 mg Oral Daily   fluticasone  2 spray Each Nare Daily   folic acid  1 mg Oral Daily   ipratropium  1-2 spray Each Nare BID   metoprolol succinate  25 mg Oral Daily   pantoprazole  40 mg Oral Daily   [START ON 05/19/2021] potassium chloride  10 mEq Oral Daily   potassium chloride  40 mEq Oral Once   rivaroxaban  20 mg Oral Q supper   tamsulosin  0.4 mg Oral QPC supper   topiramate  100 mg Oral BID   Continuous Infusions:   LOS: 0 days      Time spent: 20  minutes   Anderson Malta  Maylene Roes, DO [facility] 05/18/2021, 12:17 PM   Available via Epic  secure chat 7am-7pm After these hours, please refer to coverage provider listed on [email or web address]

## 2021-05-18 NOTE — Telephone Encounter (Signed)
Dr. Valeta Harms patient is in hospital but supposed to get his pleurx removed Wednesday 05/20/21 Can you look into what the patient needs to do

## 2021-05-18 NOTE — Progress Notes (Signed)
Initial Nutrition Assessment  INTERVENTION:   -Ensure Plus PO BID, each provides 350 kcals and 13g protein  NUTRITION DIAGNOSIS:   Increased nutrient needs related to cancer and cancer related treatments as evidenced by estimated needs.  GOAL:   Patient will meet greater than or equal to 90% of their needs  MONITOR:   PO intake, Supplement acceptance, Labs, Weight trends, I & O's  REASON FOR ASSESSMENT:   Malnutrition Screening Tool    ASSESSMENT:   75 y.o. male with medical history significant of A. 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  Pt came to hospital 8/11, has been observation status since then. Per MD note, hopeful d/c for 8/16 to SNF. Patient currently consuming 1-2 meals daily, PO 100%.  Drinks Ensure at home, will order BID.  Per weight records, pt has lost 16 lbs since February 2022 (6% wt loss x 6 months, insignificant for time frame).  Medications: Folic acid, KLOR-CON   Labs reviewed: Low Na   NUTRITION - FOCUSED PHYSICAL EXAM:  Flowsheet Row Most Recent Value  Orbital Region Moderate depletion  Upper Arm Region No depletion  Thoracic and Lumbar Region Unable to assess  Buccal Region Moderate depletion  Temple Region Moderate depletion  Clavicle Bone Region No depletion  Clavicle and Acromion Bone Region No depletion  Scapular Bone Region No depletion  Dorsal Hand No depletion  Patellar Region Unable to assess  Anterior Thigh Region Unable to assess  Posterior Calf Region Unable to assess  Edema (RD Assessment) None       Diet Order:   Diet Order             Diet regular Room service appropriate? Yes; Fluid consistency: Thin  Diet effective now                   EDUCATION NEEDS:    No education needs have been identified at this time  Skin:  Skin Assessment: Reviewed RN Assessment  Last BM:  8/14  Height:   Ht Readings from Last 1 Encounters:  05/14/21 6\' 2"  (1.88 m)    Weight:   Wt Readings from Last 1 Encounters:  05/18/21 113.2 kg    BMI:  Body mass index is 32.04 kg/m.  Estimated Nutritional Needs:   Kcal:  0263-7858  Protein:  110-125g  Fluid:  2.3L/day  [name] Bibles, MS, RD, LDN Inpatient Clinical Dietitian Contact information available via Amion

## 2021-05-18 NOTE — TOC Progression Note (Signed)
Transition of Care [facility]) - Progression Note    Patient Details  Name: Corey [name] MRN: [id number] Date of Birth: Oct 12, 1945  Transition of [facility]) CM/SW Contact  Tkeya Stencil, Marjie Skiff, RN Phone Number: 05/18/2021, 3:41 PM  Clinical Narrative:     Pt and wife provided with SNF bed offers. [name] chosen. Whitney from [facility] aware of acceptance of SNF bed. SNF auth started with [facility]. TOC will continue to follow.  Expected Discharge Plan: [place] Barriers to Discharge: No Barriers Identified  Expected Discharge Plan and Services Expected Discharge Plan: [place] arrangements for the past 2 months: Single Family Home                   Readmission Risk Interventions No flowsheet data found.

## 2021-05-19 DIAGNOSIS — D649 Anemia, unspecified: Secondary | ICD-10-CM | POA: Diagnosis not present

## 2021-05-19 LAB — CBC
HCT: 25.3 % — ABNORMAL LOW (ref 39.0–52.0)
Hemoglobin: 8.3 g/dL — ABNORMAL LOW (ref 13.0–17.0)
MCH: 29.1 pg (ref 26.0–34.0)
MCHC: 32.8 g/dL (ref 30.0–36.0)
MCV: 88.8 fL (ref 80.0–100.0)
Platelets: 98 10*3/uL — ABNORMAL LOW (ref 150–400)
RBC: 2.85 MIL/uL — ABNORMAL LOW (ref 4.22–5.81)
RDW: 19.3 % — ABNORMAL HIGH (ref 11.5–15.5)
WBC: 6.8 10*3/uL (ref 4.0–10.5)
nRBC: 0 % (ref 0.0–0.2)

## 2021-05-19 LAB — BASIC METABOLIC PANEL
Anion gap: 9 (ref 5–15)
BUN: 17 mg/dL (ref 8–23)
CO2: 22 mmol/L (ref 22–32)
Calcium: 8.7 mg/dL — ABNORMAL LOW (ref 8.9–10.3)
Chloride: 104 mmol/L (ref 98–111)
Creatinine, Ser: 0.67 mg/dL (ref 0.61–1.24)
GFR, Estimated: >60 mL/min (ref >60)
Glucose, Bld: 98 mg/dL (ref 70–99)
Potassium: 4.1 mmol/L (ref 3.5–5.1)
Sodium: 135 mmol/L (ref 135–145)

## 2021-05-19 LAB — BPAM RBC
Blood Product Expiration Date: 202209172359
ISSUE DATE / TIME: 202208151655
Unit Type and Rh: 5100

## 2021-05-19 LAB — TYPE AND SCREEN
ABO/RH(D): O POS
Antibody Screen: NEGATIVE
Unit division: 0

## 2021-05-19 LAB — SARS CORONAVIRUS 2 (TAT 6-24 HRS): SARS Coronavirus 2: NEGATIVE

## 2021-05-19 MED ORDER — LORATADINE 10 MG PO TABS
10.0000 mg | ORAL_TABLET | Freq: Every day | ORAL | Status: DC | PRN
  Administered 2021-05-19: 10 mg via ORAL
  Filled 2021-05-19: qty 1

## 2021-05-19 NOTE — Progress Notes (Signed)
PROGRESS NOTE    MANSEL STROTHER  CBJ:[id number] DOB: May 13, 1946 DOA: 05/14/2021 PCP: [name], MD     Brief Narrative:  Corey [name] is a 75 y.o. male with medical history significant of A. 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 His last chemo was on 05/11/2021 and had labs done after the chemo which shows hemoglobin of 7.7.  He was scheduled for blood transfusion however he felt extremely weak, fatigue with exertional dyspnea and nausea therefore he called EMS and came to the hospital for further evaluation and management.  He received blood transfusions with slight improvement in symptoms but continued to be weak overall.   New events last 24 hours / Subjective: Patient up to a chair this morning, continues to work on his strength.  Asking if his left Pleurx catheter can be removed while he is here, it was scheduled to be removed by Dr. Valeta Harms as outpatient 8/17.  Dr. Valeta Harms will arrange to have it removed today or tomorrow inpatient  Assessment & Plan:   Principal Problem:   Symptomatic anemia Active Problems:   Coronary artery disease   Benign essential HTN   Dyslipidemia   GERD (gastroesophageal reflux disease)   AF (paroxysmal atrial fibrillation) (HCC)   Non-small cell carcinoma of left lung, stage 4 (HCC)   Symptomatic anemia, chemotherapy-induced -FOBT negative  -Status post 3 unit packed red blood cell transfusion -Hemoglobin stable today -PT recommending SNF placement  Stage IV non-small cell lung cancer -Currently undergoing chemotherapy, followed by Dr. [name]  Paroxysmal atrial fibrillation -Continue Cardizem, metoprolol, Tikosyn, resume Xarelto  Chronic hyponatremia -Stable   Leukocytosis -Received pegfilgrastim  8/10  -Resolved   Hyperlipidemia -Continue lipitor, fenofibrate  History of prostate cancer status postradiation therapy -Continue Flomax -Patient asked to have his PSA checked while inpatient, was supposed to get it checked by his urologist this week.  PSA 0.01  Depression/anxiety -Continue trazadone, Xanax  OSA -CPAP qhs     DVT prophylaxis:  SCDs Start: 05/14/21 0945 rivaroxaban (XARELTO) tablet 20 mg  Code Status:     Code Status Orders  (From admission, onward)           Start     Ordered   05/14/21 0945  Full code  Continuous        05/14/21 0945           Code Status History     Date Active Date Inactive Code Status Order ID Comments User Context   04/01/2021 0948 04/04/2021 1632 DNR [id number]  Collier Bullock, MD ED   02/24/2021 1611 02/27/2021 1834 Full Code [id number]  Lequita Halt, MD ED   09/18/2019 1541 09/21/2019 1716 Full Code [id number]  Oliver Barre, PA Inpatient   05/27/2017 0259 05/27/2017 1426 Full Code [id number]  Doylene Canning, MD ED   06/04/2016 1609 06/10/2016 1529 Full Code [id number]  Chahn-Yang Dessa Phi, DO Inpatient   05/31/2016 2057 06/02/2016 1800 Full Code [id number]  Toy Baker, MD Inpatient   07/06/2012 0021 07/07/2012 2225 Full Code [id number]  Kalman Drape, MD ED      Advance Directive Documentation    Flowsheet Row Most Recent Value  Type of Advance Directive Healthcare Power of Attorney  Pre-existing out of facility DNR order (yellow form or pink MOST form) --  "MOST" Form  in Place? --      Family Communication: None at bedside Disposition Plan:  Status is: Observation  The patient remains OBS appropriate due to lack of medical necessity as discussed with [facility].   Dispo: The patient is from: Home              Anticipated d/c is to: SNF              Patient currently is medically stable to d/c.   Difficult to place patient No     Consultants:  None  Procedures:  None   Antimicrobials:   Anti-infectives (From admission, onward)    None        Objective: Vitals:   05/18/21 1941 05/19/21 0500 05/19/21 0624 05/19/21 0809  BP: 123/68  123/79   Pulse: 90  81   Resp: 18  20   Temp: 98.9 F (37.2 C)  98.4 F (36.9 C)   TempSrc: Oral  Oral   SpO2: 99%  100% 99%  Weight:  112.2 kg    Height:        Intake/Output Summary (Last 24 hours) at 05/19/2021 1209 Last data filed at 05/19/2021 0900 Gross per 24 hour  Intake 2176.1 ml  Output --  Net 2176.1 ml    Filed Weights   05/17/21 0528 05/18/21 0549 05/19/21 0500  Weight: 113.2 kg 113.2 kg 112.2 kg    Examination: General exam: Appears calm and comfortable  Respiratory system: Mild crackles left lower lobe. No distress.  Cardiovascular system: S1 & S2 heard, RRR. No pedal edema. Gastrointestinal system: Abdomen is nondistended, soft and nontender. Normal bowel sounds heard. Central nervous system: Alert and oriented. Non focal exam. Speech clear  Extremities: Symmetric in appearance bilaterally  Skin: No rashes, lesions or ulcers on exposed skin  Psychiatry: Judgement and insight appear stable. Mood & affect appropriate.     Data Reviewed: I have personally reviewed following labs and imaging studies  CBC: Recent Labs  Lab 05/15/21 0541 05/16/21 0518 05/17/21 0528 05/18/21 0544 05/18/21 2232 05/19/21 0625  WBC 24.5* 14.4* 8.8 8.5  --  6.8  HGB 8.7* 8.3* 7.9* 7.8* 8.6* 8.3*  HCT 26.8* 25.6* 24.7* 24.3* 26.0* 25.3*  MCV 90.5 89.2 89.5 88.4  --  88.8  PLT 275 214 144* 133*  --  98*    Basic Metabolic Panel: Recent Labs  Lab 05/14/21 0842 05/15/21 0541 05/16/21 0617 05/17/21 0528 05/18/21 0544 05/19/21 0625  NA 131* 131* 128* 129* 130* 135  K 3.9 3.7 3.6 3.9 3.7 4.1  CL 100 100 98 98 99 104  CO2 21* 22 21* 22 23 22   GLUCOSE 104* 113* 100* 100* 99 98  BUN 22 16 13 16 18 17   CREATININE 0.70 0.68 0.69 0.73 0.75 0.67  CALCIUM 8.5* 8.6* 8.4* 8.4* 8.5* 8.7*  MG 1.8  --   --   --   --   --      GFR: Estimated Creatinine Clearance: 109.6 mL/min (by C-G formula based on SCr of 0.67 mg/dL). Liver Function Tests: Recent Labs  Lab 05/15/21 0541  AST 24  ALT 17  ALKPHOS 64  BILITOT 1.0  PROT 6.1*  ALBUMIN 2.8*    No results for input(s): LIPASE, AMYLASE in the last 168 hours. No results for input(s): AMMONIA in the last 168 hours. Coagulation Profile: No results for input(s): INR, PROTIME in the last 168 hours. Cardiac Enzymes: No results for input(s): CKTOTAL, CKMB, CKMBINDEX, TROPONINI in the last 168  hours. BNP (last 3 results) No results for input(s): PROBNP in the last 8760 hours. HbA1C: No results for input(s): HGBA1C in the last 72 hours. CBG: Recent Labs  Lab 05/14/21 0849  GLUCAP 106*    Lipid Profile: No results for input(s): CHOL, HDL, LDLCALC, TRIG, CHOLHDL, LDLDIRECT in the last 72 hours. Thyroid Function Tests: No results for input(s): TSH, T4TOTAL, FREET4, T3FREE, THYROIDAB in the last 72 hours. Anemia Panel: No results for input(s): VITAMINB12, FOLATE, FERRITIN, TIBC, IRON, RETICCTPCT in the last 72 hours.  Sepsis Labs: No results for input(s): PROCALCITON, LATICACIDVEN in the last 168 hours.  Recent Results (from the past 240 hour(s))  SARS CORONAVIRUS 2 (TAT 6-24 HRS) Nasopharyngeal Nasopharyngeal Swab     Status: None   Collection Time: 05/14/21  9:38 AM   Specimen: Nasopharyngeal Swab  Result Value Ref Range Status   SARS Coronavirus 2 NEGATIVE NEGATIVE Final    Comment: (NOTE) SARS-CoV-2 target nucleic acids are NOT DETECTED.  The SARS-CoV-2 RNA is generally detectable in upper and lower respiratory specimens during the acute phase of infection. Negative results do not preclude SARS-CoV-2 infection, do not rule out co-infections with other pathogens, and should not be used as the sole basis for treatment or other patient management decisions. Negative results must be combined with clinical observations, patient history, and  epidemiological information. The expected result is Negative.  Fact Sheet for Patients: SugarRoll.be  Fact Sheet for Healthcare Providers: https://www.woods-mathews.com/  This test is not yet approved or cleared by the [place] FDA and  has been authorized for detection and/or diagnosis of SARS-CoV-2 by FDA under an Emergency Use Authorization (EUA). This EUA will remain  in effect (meaning this test can be used) for the duration of the COVID-19 declaration under Se ction 564(b)(1) of the Act, 21 U.S.C. section 360bbb-3(b)(1), unless the authorization is terminated or revoked sooner.  Performed at [facility], [place] [address] [place], [place] [address]        Radiology Studies: No results found.    Scheduled Meds:  arformoterol  15 mcg Nebulization BID   And   umeclidinium bromide  1 puff Inhalation Daily   atorvastatin  40 mg Oral Daily   Chlorhexidine Gluconate Cloth  6 each Topical Daily   dofetilide  500 mcg Oral BID   feeding supplement  237 mL Oral BID BM   fenofibrate  160 mg Oral Daily   fluticasone  2 spray Each Nare Daily   folic acid  1 mg Oral Daily   ipratropium  1-2 spray Each Nare BID   metoprolol succinate  25 mg Oral Daily   pantoprazole  40 mg Oral Daily   potassium chloride  10 mEq Oral Daily   rivaroxaban  20 mg Oral Q supper   tamsulosin  0.4 mg Oral QPC supper   topiramate  100 mg Oral BID   Continuous Infusions:   LOS: 0 days      Time spent: 20  minutes   Dessa Phi, DO [facility] 05/19/2021, 12:09 PM   Available via Epic secure chat 7am-7pm After these hours, please refer to coverage provider listed on [email or web address]

## 2021-05-19 NOTE — Care Management Obs Status (Signed)
[place] NOTIFICATION   Patient Details  Name: Corey [name] MRN: [id number] Date of Birth: 07-21-1946   Medicare Observation Status Notification Given:  Yes    Lynnell Catalan, RN 05/19/2021, 3:26 PM

## 2021-05-19 NOTE — Telephone Encounter (Signed)
Called spoke with wife patient is at [employer] long  Wife would like Dr. Valeta Harms to call her for update and instructions.   Will send to Dr. Valeta Harms so he can call patient's wife.

## 2021-05-19 NOTE — TOC Progression Note (Signed)
Transition of Care [facility]) - Progression Note    Patient Details  Name: Corey [name] MRN: [id number] Date of Birth: 1946/07/18  [facility] CM/SW Contact  Yaslin Kirtley, Marjie Skiff, RN Phone Number: 05/19/2021, 3:27 PM  Clinical Narrative:     SNF auth received from [facility] [id number]. Auth good from 8/16-8/18. Pt to get pleurx catheter removed while in the hospital. Covid test ordered for SNF.  Expected Discharge Plan: [place] Barriers to Discharge: No Barriers Identified  Expected Discharge Plan and Services Expected Discharge Plan: [place] arrangements for the past 2 months: Single Family Home                     Readmission Risk Interventions No flowsheet data found.

## 2021-05-19 NOTE — Telephone Encounter (Signed)
Can Dr. Silas Flood make sure the patient is aware the appointment was cancelled for Friday .

## 2021-05-19 NOTE — Telephone Encounter (Signed)
Pt is in [place] long wife called back to see what we need to do [phone]

## 2021-05-20 ENCOUNTER — Encounter (HOSPITAL_COMMUNITY): Admission: RE | Payer: Self-pay | Source: Home / Self Care

## 2021-05-20 ENCOUNTER — Ambulatory Visit (HOSPITAL_COMMUNITY): Admission: RE | Admit: 2021-05-20 | Payer: Medicare PPO | Source: Home / Self Care | Admitting: Pulmonary Disease

## 2021-05-20 ENCOUNTER — Observation Stay (HOSPITAL_COMMUNITY): Payer: Medicare PPO

## 2021-05-20 DIAGNOSIS — J449 Chronic obstructive pulmonary disease, unspecified: Secondary | ICD-10-CM | POA: Diagnosis not present

## 2021-05-20 DIAGNOSIS — Z8673 Personal history of transient ischemic attack (TIA), and cerebral infarction without residual deficits: Secondary | ICD-10-CM | POA: Diagnosis not present

## 2021-05-20 DIAGNOSIS — Z79899 Other long term (current) drug therapy: Secondary | ICD-10-CM | POA: Diagnosis not present

## 2021-05-20 DIAGNOSIS — E440 Moderate protein-calorie malnutrition: Secondary | ICD-10-CM | POA: Diagnosis not present

## 2021-05-20 DIAGNOSIS — I2510 Atherosclerotic heart disease of native coronary artery without angina pectoris: Secondary | ICD-10-CM | POA: Diagnosis not present

## 2021-05-20 DIAGNOSIS — Z7409 Other reduced mobility: Secondary | ICD-10-CM | POA: Diagnosis not present

## 2021-05-20 DIAGNOSIS — Z95828 Presence of other vascular implants and grafts: Secondary | ICD-10-CM | POA: Diagnosis not present

## 2021-05-20 DIAGNOSIS — T451X5A Adverse effect of antineoplastic and immunosuppressive drugs, initial encounter: Secondary | ICD-10-CM | POA: Diagnosis not present

## 2021-05-20 DIAGNOSIS — Z5112 Encounter for antineoplastic immunotherapy: Secondary | ICD-10-CM | POA: Diagnosis not present

## 2021-05-20 DIAGNOSIS — D6481 Anemia due to antineoplastic chemotherapy: Secondary | ICD-10-CM | POA: Diagnosis not present

## 2021-05-20 DIAGNOSIS — I482 Chronic atrial fibrillation, unspecified: Secondary | ICD-10-CM | POA: Diagnosis not present

## 2021-05-20 DIAGNOSIS — J910 Malignant pleural effusion: Secondary | ICD-10-CM | POA: Diagnosis not present

## 2021-05-20 DIAGNOSIS — J441 Chronic obstructive pulmonary disease with (acute) exacerbation: Secondary | ICD-10-CM | POA: Diagnosis not present

## 2021-05-20 DIAGNOSIS — D649 Anemia, unspecified: Secondary | ICD-10-CM | POA: Diagnosis not present

## 2021-05-20 DIAGNOSIS — Z5111 Encounter for antineoplastic chemotherapy: Secondary | ICD-10-CM | POA: Diagnosis not present

## 2021-05-20 DIAGNOSIS — C787 Secondary malignant neoplasm of liver and intrahepatic bile duct: Secondary | ICD-10-CM | POA: Diagnosis not present

## 2021-05-20 DIAGNOSIS — C3490 Malignant neoplasm of unspecified part of unspecified bronchus or lung: Secondary | ICD-10-CM | POA: Diagnosis not present

## 2021-05-20 DIAGNOSIS — F5102 Adjustment insomnia: Secondary | ICD-10-CM | POA: Diagnosis not present

## 2021-05-20 DIAGNOSIS — R531 Weakness: Secondary | ICD-10-CM | POA: Diagnosis not present

## 2021-05-20 DIAGNOSIS — Z8546 Personal history of malignant neoplasm of prostate: Secondary | ICD-10-CM | POA: Diagnosis not present

## 2021-05-20 DIAGNOSIS — C61 Malignant neoplasm of prostate: Secondary | ICD-10-CM | POA: Diagnosis not present

## 2021-05-20 DIAGNOSIS — J90 Pleural effusion, not elsewhere classified: Secondary | ICD-10-CM | POA: Diagnosis not present

## 2021-05-20 DIAGNOSIS — F4320 Adjustment disorder, unspecified: Secondary | ICD-10-CM | POA: Diagnosis not present

## 2021-05-20 DIAGNOSIS — C3412 Malignant neoplasm of upper lobe, left bronchus or lung: Secondary | ICD-10-CM | POA: Diagnosis not present

## 2021-05-20 DIAGNOSIS — Z20822 Contact with and (suspected) exposure to covid-19: Secondary | ICD-10-CM | POA: Diagnosis not present

## 2021-05-20 DIAGNOSIS — I480 Paroxysmal atrial fibrillation: Secondary | ICD-10-CM | POA: Diagnosis not present

## 2021-05-20 DIAGNOSIS — C3432 Malignant neoplasm of lower lobe, left bronchus or lung: Secondary | ICD-10-CM | POA: Diagnosis not present

## 2021-05-20 DIAGNOSIS — R918 Other nonspecific abnormal finding of lung field: Secondary | ICD-10-CM | POA: Diagnosis not present

## 2021-05-20 DIAGNOSIS — C778 Secondary and unspecified malignant neoplasm of lymph nodes of multiple regions: Secondary | ICD-10-CM | POA: Diagnosis not present

## 2021-05-20 DIAGNOSIS — I10 Essential (primary) hypertension: Secondary | ICD-10-CM | POA: Diagnosis not present

## 2021-05-20 DIAGNOSIS — F418 Other specified anxiety disorders: Secondary | ICD-10-CM | POA: Diagnosis not present

## 2021-05-20 LAB — CBC
HCT: 26.3 % — ABNORMAL LOW (ref 39.0–52.0)
Hemoglobin: 8.6 g/dL — ABNORMAL LOW (ref 13.0–17.0)
MCH: 28.9 pg (ref 26.0–34.0)
MCHC: 32.7 g/dL (ref 30.0–36.0)
MCV: 88.3 fL (ref 80.0–100.0)
Platelets: 76 10*3/uL — ABNORMAL LOW (ref 150–400)
RBC: 2.98 MIL/uL — ABNORMAL LOW (ref 4.22–5.81)
RDW: 19.2 % — ABNORMAL HIGH (ref 11.5–15.5)
WBC: 6.1 10*3/uL (ref 4.0–10.5)
nRBC: 0 % (ref 0.0–0.2)

## 2021-05-20 SURGERY — INSERTION, PLEURAL DRAINAGE CATHETER
Anesthesia: LOCAL

## 2021-05-20 MED ORDER — HEPARIN SOD (PORK) LOCK FLUSH 100 UNIT/ML IV SOLN: 250.0000 [IU] | INTRAVENOUS | Status: DC | PRN

## 2021-05-20 MED ORDER — TRAMADOL HCL 50 MG PO TABS: 50.0000 mg | ORAL_TABLET | Freq: Four times a day (QID) | ORAL | 0 refills | Status: DC | PRN

## 2021-05-20 MED ORDER — ALPRAZOLAM 0.5 MG PO TABS: 0.5000 mg | ORAL_TABLET | Freq: Every evening | ORAL | 0 refills | Status: DC | PRN

## 2021-05-20 MED ORDER — LIDOCAINE HCL (PF) 1 % IJ SOLN
20.0000 mL | Freq: Once | INTRAMUSCULAR | Status: AC
  Administered 2021-05-20: 20 mL via INTRADERMAL
  Filled 2021-05-20 (×2): qty 20

## 2021-05-20 MED ORDER — HEPARIN SOD (PORK) LOCK FLUSH 100 UNIT/ML IV SOLN
500.0000 [IU] | INTRAVENOUS | Status: AC | PRN
  Administered 2021-05-20: 500 [IU]
  Filled 2021-05-20: qty 5

## 2021-05-20 NOTE — Progress Notes (Signed)
Patient and family member would like to cancel PTAR to [facility]. Patient would like to ride with wife instead. AC made aware. [facility] update of changes and PTAR canceled.

## 2021-05-20 NOTE — Procedures (Addendum)
PleurX Removal Note:  Patient was placed right lateral decubitus position.  Dressing was taken down.  Direction and location of tunnel portion of catheter was identified.  8 cc 1% lidocaine was introduced subcutaneously along the tract of the tunneled portion of the catheter.  Subsequently, hemostat was used to bluntly dissect the cuff from the subcutaneous tissue.  Once adequate dissection was obtained, the catheter was firmly grasped and pulled.  The cuff came loose and the catheter was easily removed from the pleural cavity.  A compressive dressing was placed.  A chest x-ray is ordered to confirm removal.

## 2021-05-20 NOTE — Telephone Encounter (Signed)
Patient informed of cancelled clinic visit Friday.

## 2021-05-20 NOTE — TOC Transition Note (Signed)
Transition of Care [facility]) - CM/SW Discharge Note   Patient Details  Name: Corey [name] MRN: [id number] Date of Birth: Dec 19, 1945  [facility] CM/SW Contact:  Lynnell Catalan, RN Phone Number: 05/20/2021, 12:43 PM   Clinical Narrative:     Pt to dc to [facility] room 105 today. RN to call report to [phone]. PTAR contacted for transport.  Final next level of care: Skilled Nursing Facility Barriers to Discharge: No Barriers Identified   Patient Goals and CMS Choice Patient states their goals for this hospitalization and ongoing recovery are:: To get better CMS Medicare.gov Compare Post Acute Care list provided to:: Patient Choice offered to / list presented to : Patient, Spouse              Social Determinants of Health (SDOH) Interventions     Readmission Risk Interventions No flowsheet data found.

## 2021-05-20 NOTE — Progress Notes (Signed)
RN called report to [facility].

## 2021-05-20 NOTE — Discharge Summary (Signed)
Physician Discharge Summary  Corey [name] ZOX:[id number] DOB: 11/02/1945 DOA: 05/14/2021  PCP: [name], MD  Admit date: 05/14/2021 Discharge date: 05/20/2021 30 Day Unplanned Readmission Risk Score    Flowsheet Row ED to Hosp-Admission (Discharged) from 04/01/2021 in [facility]  30 Day Unplanned Readmission Risk Score (%) 29.17 Filed at 04/04/2021 0800       This score is the patient's risk of an unplanned readmission within 30 days of being discharged (0 -100%). The score is based on dignosis, age, lab data, medications, orders, and past utilization.   Low:  0-14.9   Medium: 15-21.9   High: 22-29.9   Extreme: 30 and above          Admitted From: Home Disposition: SNF  Recommendations for Outpatient Follow-up:  Follow up with PCP in 1-2 weeks Please obtain BMP/CBC in one week Follow-up with oncology per schedule Please follow up with your PCP on the following pending results: Unresulted Labs (From admission, onward)    None         Home Health: None Equipment/Devices: None  Discharge Condition: Stable CODE STATUS: Full code Diet recommendation: Cardiac  Subjective: Seen and examined.  No complaints feels well.  Ready to be discharged.  Brief/Interim Summary: Corey [name] is a 75 y.o. male with medical history significant of A. 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 His last chemo was on 05/11/2021 and had labs done after the chemo which shows hemoglobin of 7.7.  He was scheduled for blood transfusion however he felt extremely weak, fatigue with exertional dyspnea and nausea therefore he called EMS and came to the hospital for further evaluation and management.  Upon arrival, his hemoglobin was found to be  6.7.  He was transfused 2 units of PRBC transfusion, it improved but then he was given another unit of transfusion as well, completed 3 units so far this admission.  FOBT was negative.   His rest of the medical issues remained stable.  His home medications were continued.  His anticoagulation/Xarelto was also continued.  His anemia was likely chemotherapy-induced.  Now his hemoglobin has remained stable since past 2 to 3 days.  He was evaluated by PT OT who recommended SNF.  It is arranged for him and he is going to be discharged in stable condition.  Discharge Diagnoses:  Principal Problem:   Symptomatic anemia Active Problems:   Coronary artery disease   Benign essential HTN   Dyslipidemia   GERD (gastroesophageal reflux disease)   AF (paroxysmal atrial fibrillation) (HCC)   Non-small cell carcinoma of left lung, stage 4 (HCC)    Discharge Instructions   Allergies as of 05/20/2021       Reactions   Amoxicillin Swelling   Tolerates Rocephin   Fosinopril Other (See Comments)   Hydromorphone Nausea Only, Other (See Comments), Nausea And Vomiting   Ampicillin Rash   Monopril [fosinopril Sodium] Other (See Comments)   Muscle aches & pain   Other Other (See Comments)   Rosuvastatin Other (See Comments)   Joint pain        Medication List     TAKE these medications    albuterol 108 (90 Base) MCG/ACT inhaler Commonly known as: VENTOLIN HFA Inhale 2 puffs into the lungs every 4 (four) hours as needed for  wheezing or shortness of breath. Inhale 2 puffs into the lungs every 4 hours as needed   ALPRAZolam 0.5 MG tablet Commonly known as: XANAX Take 1 tablet (0.5 mg total) by mouth at bedtime as needed for anxiety.   atorvastatin 40 MG tablet Commonly known as: LIPITOR Take 1 tablet (40 mg total) by mouth daily.   benzonatate 200 MG capsule Commonly known as: TESSALON Take 1 capsule (200 mg total) by mouth 3 (three) times daily as needed for cough.   Carbinoxamine Maleate  6 MG Tabs Take 1 tablet by mouth 2 (two) times daily as needed (drainage).   chlorpheniramine-HYDROcodone 10-8 MG/5ML Suer Commonly known as: TUSSIONEX Take 5 mLs by mouth every 12 (twelve) hours as needed for cough.   diclofenac Sodium 1 % Gel Commonly known as: VOLTAREN Apply 4 g topically 4 (four) times daily. What changed:  when to take this reasons to take this   dicyclomine 20 MG tablet Commonly known as: BENTYL Take 20 mg by mouth daily as needed for spasms.   diltiazem 30 MG tablet Commonly known as: Cardizem Take 1 tablet (30 mg total) by mouth 4 (four) times daily as needed. What changed: reasons to take this   dofetilide 500 MCG capsule Commonly known as: TIKOSYN Take 1 capsule (500 mcg total) by mouth 2 (two) times daily. Please keep upcoming appointment in February for future refills. Thank you   fenofibrate 160 MG tablet TAKE 1 TABLET DAILY. PLEASE KEEP UPCOMING APPT IN MARCH WITH DR. Acie Fredrickson BEFORE ANYMORE REFILLS. What changed:  how much to take how to take this when to take this additional instructions   fluticasone 50 MCG/ACT nasal spray Commonly known as: FLONASE Place 2 sprays into both nostrils daily.   folic acid 1 MG tablet Commonly known as: FOLVITE Take 1 tablet (1 mg total) by mouth daily.   ipratropium 0.03 % nasal spray Commonly known as: ATROVENT Place 1-2 sprays into both nostrils every 12 (twelve) hours. For drainage   leuprolide (6 Month) 45 MG injection Commonly known as: ELIGARD Inject 45 mg into the skin every 6 (six) months.   lidocaine-prilocaine cream Commonly known as: EMLA Apply 1 application topically as needed.   loperamide 2 MG capsule Commonly known as: IMODIUM Take 2 mg by mouth daily as needed for diarrhea or loose stools.   magnesium oxide 400 MG tablet Commonly known as: MAG-OX TAKE 1 TABLET BY MOUTH EVERY DAY   metoprolol succinate 25 MG 24 hr tablet Commonly known as: Toprol XL Take 1 tablet (25 mg  total) by mouth daily.   multivitamins ther. w/minerals Tabs tablet Take 1 tablet by mouth daily.   nitroGLYCERIN 0.4 MG SL tablet Commonly known as: NITROSTAT Place 1 tablet (0.4 mg total) under the tongue every 5 (five) minutes as needed for chest pain.   omeprazole 20 MG capsule Commonly known as: PRILOSEC Take 1 capsule (20 mg total) by mouth daily.   ondansetron 8 MG tablet Commonly known as: ZOFRAN Take 1 tablet (8 mg total) by mouth every 8 (eight) hours as needed for nausea or vomiting. Starting 3 days after chemotherapy   potassium chloride 10 MEQ tablet Commonly known as: KLOR-CON Take 1 tablet (10 mEq total) by mouth daily.   Probiotic 250 MG Caps Take 1 capsule by mouth daily.   promethazine 25 MG tablet Commonly known as: PHENERGAN Take 1 tablet (25 mg total) by mouth every 6 (six) hours as needed for nausea or vomiting.   Stiolto Respimat  2.5-2.5 MCG/ACT Aers Generic drug: Tiotropium Bromide-Olodaterol Inhale 2 puffs into the lungs daily.   sucralfate 1 GM/10ML suspension Commonly known as: Carafate Take 10 mLs (1 g total) by mouth 4 (four) times daily. Before meals What changed:  when to take this reasons to take this   tamsulosin 0.4 MG Caps capsule Commonly known as: FLOMAX Take 0.4 mg by mouth daily after supper.   topiramate 50 MG tablet Commonly known as: TOPAMAX Take 2 tablets (100 mg total) by mouth 2 (two) times daily.   traMADol 50 MG tablet Commonly known as: ULTRAM Take 1 tablet (50 mg total) by mouth every 6 (six) hours as needed for moderate pain.   traZODone 50 MG tablet Commonly known as: DESYREL Take 0.5 tablets (25 mg total) by mouth at bedtime as needed for sleep.   Tylenol 8 Hour Arthritis Pain 650 MG CR tablet Generic drug: acetaminophen Take 1,300 mg by mouth every 8 (eight) hours as needed for pain (pain).   Vitamin D (Ergocalciferol) 1.25 MG (50000 UNIT) Caps capsule Commonly known as: DRISDOL TAKE 1 CAPSULE BY MOUTH  ONE TIME PER WEEK What changed: See the new instructions.   Xarelto 20 MG Tabs tablet Generic drug: rivaroxaban TAKE 1 TABLET BY MOUTH EVERY DAY What changed:  how much to take how to take this when to take this        Follow-up Information     Koirala, Dibas, MD Follow up in 1 week(s).   Specialty: Family Medicine Contact information: [place] [address] [phone]         Nahser, Wonda Cheng, MD .   Specialty: Cardiology Contact information: [address] [place] [address] [phone]                Allergies  Allergen Reactions   Amoxicillin Swelling    Tolerates Rocephin   Fosinopril Other (See Comments)   Hydromorphone Nausea Only, Other (See Comments) and Nausea And Vomiting   Ampicillin Rash   Monopril [Fosinopril Sodium] Other (See Comments)    Muscle aches & pain   Other Other (See Comments)   Rosuvastatin Other (See Comments)    Joint pain    Consultations: Oncology   Procedures/Studies: CUP PACEART REMOTE DEVICE CHECK  Result Date: 05/02/2021 ILR summary report received. Battery status OK. Normal device function. No new symptom, tachy, brady, or pause episodes. One previously viewed and reviewed AF episode. Monthly summary reports and ROV/PRN Kathy Breach, RN, CCDS, [employer]    Discharge Exam: Vitals:   05/20/21 0800 05/20/21 0803  BP:    Pulse:    Resp:    Temp:    SpO2: 97% 97%   Vitals:   05/19/21 2032 05/20/21 0501 05/20/21 0800 05/20/21 0803  BP: (!) 141/75 126/72    Pulse: 89 88    Resp: 18 18    Temp: 98 F (36.7 C) 97.6 F (36.4 C)    TempSrc: Oral Oral    SpO2: 95% 97% 97% 97%  Weight:  114.4 kg    Height:        General: Pt is alert, awake, not in acute distress Cardiovascular: RRR, S1/S2 +, no rubs, no gallops Respiratory: CTA bilaterally, no wheezing, no rhonchi Abdominal: Soft, NT, ND, bowel sounds + Extremities: no edema, no  cyanosis    The results of significant diagnostics from this hospitalization (including imaging, microbiology, ancillary and laboratory) are listed below for reference.  Microbiology: Recent Results (from the past 240 hour(s))  SARS CORONAVIRUS 2 (TAT 6-24 HRS) Nasopharyngeal Nasopharyngeal Swab     Status: None   Collection Time: 05/14/21  9:38 AM   Specimen: Nasopharyngeal Swab  Result Value Ref Range Status   SARS Coronavirus 2 NEGATIVE NEGATIVE Final    Comment: (NOTE) SARS-CoV-2 target nucleic acids are NOT DETECTED.  The SARS-CoV-2 RNA is generally detectable in upper and lower respiratory specimens during the acute phase of infection. Negative results do not preclude SARS-CoV-2 infection, do not rule out co-infections with other pathogens, and should not be used as the sole basis for treatment or other patient management decisions. Negative results must be combined with clinical observations, patient history, and epidemiological information. The expected result is Negative.  Fact Sheet for Patients: SugarRoll.be  Fact Sheet for Healthcare Providers: https://www.woods-mathews.com/  This test is not yet approved or cleared by the [place] FDA and  has been authorized for detection and/or diagnosis of SARS-CoV-2 by FDA under an Emergency Use Authorization (EUA). This EUA will remain  in effect (meaning this test can be used) for the duration of the COVID-19 declaration under Se ction 564(b)(1) of the Act, 21 U.S.C. section 360bbb-3(b)(1), unless the authorization is terminated or revoked sooner.  Performed at [facility], [place] [address] [place], [place] [address]   SARS CORONAVIRUS 2 (TAT 6-24 HRS) Nasopharyngeal Nasopharyngeal Swab     Status: None   Collection Time: 05/19/21  7:40 AM   Specimen: Nasopharyngeal Swab  Result Value Ref Range Status   SARS Coronavirus 2 NEGATIVE NEGATIVE Final    Comment:  (NOTE) SARS-CoV-2 target nucleic acids are NOT DETECTED.  The SARS-CoV-2 RNA is generally detectable in upper and lower respiratory specimens during the acute phase of infection. Negative results do not preclude SARS-CoV-2 infection, do not rule out co-infections with other pathogens, and should not be used as the sole basis for treatment or other patient management decisions. Negative results must be combined with clinical observations, patient history, and epidemiological information. The expected result is Negative.  Fact Sheet for Patients: SugarRoll.be  Fact Sheet for Healthcare Providers: https://www.woods-mathews.com/  This test is not yet approved or cleared by the [place] FDA and  has been authorized for detection and/or diagnosis of SARS-CoV-2 by FDA under an Emergency Use Authorization (EUA). This EUA will remain  in effect (meaning this test can be used) for the duration of the COVID-19 declaration under Se ction 564(b)(1) of the Act, 21 U.S.C. section 360bbb-3(b)(1), unless the authorization is terminated or revoked sooner.  Performed at [facility], [place] [address] [place], [place] [address]      Labs: BNP (last 3 results) No results for input(s): BNP in the last 8760 hours. Basic Metabolic Panel: Recent Labs  Lab 05/14/21 0842 05/15/21 0541 05/16/21 0617 05/17/21 0528 05/18/21 0544 05/19/21 0625  NA 131* 131* 128* 129* 130* 135  K 3.9 3.7 3.6 3.9 3.7 4.1  CL 100 100 98 98 99 104  CO2 21* 22 21* 22 23 22   GLUCOSE 104* 113* 100* 100* 99 98  BUN 22 16 13 16 18 17   CREATININE 0.70 0.68 0.69 0.73 0.75 0.67  CALCIUM 8.5* 8.6* 8.4* 8.4* 8.5* 8.7*  MG 1.8  --   --   --   --   --    Liver Function Tests: Recent Labs  Lab 05/15/21 0541  AST 24  ALT 17  ALKPHOS 64  BILITOT 1.0  PROT  6.1*  ALBUMIN 2.8*   No results for input(s): LIPASE, AMYLASE in the last 168 hours. No results for input(s):  AMMONIA in the last 168 hours. CBC: Recent Labs  Lab 05/16/21 0518 05/17/21 0528 05/18/21 0544 05/18/21 2232 05/19/21 0625 05/20/21 0550  WBC 14.4* 8.8 8.5  --  6.8 6.1  HGB 8.3* 7.9* 7.8* 8.6* 8.3* 8.6*  HCT 25.6* 24.7* 24.3* 26.0* 25.3* 26.3*  MCV 89.2 89.5 88.4  --  88.8 88.3  PLT 214 144* 133*  --  98* 76*   Cardiac Enzymes: No results for input(s): CKTOTAL, CKMB, CKMBINDEX, TROPONINI in the last 168 hours. BNP: Invalid input(s): POCBNP CBG: Recent Labs  Lab 05/14/21 0849  GLUCAP 106*   D-Dimer No results for input(s): DDIMER in the last 72 hours. Hgb A1c No results for input(s): HGBA1C in the last 72 hours. Lipid Profile No results for input(s): CHOL, HDL, LDLCALC, TRIG, CHOLHDL, LDLDIRECT in the last 72 hours. Thyroid function studies No results for input(s): TSH, T4TOTAL, T3FREE, THYROIDAB in the last 72 hours.  Invalid input(s): FREET3 Anemia work up No results for input(s): VITAMINB12, FOLATE, FERRITIN, TIBC, IRON, RETICCTPCT in the last 72 hours. Urinalysis    Component Value Date/Time   COLORURINE YELLOW 05/14/2021 0842   APPEARANCEUR CLEAR 05/14/2021 0842   LABSPEC 1.014 05/14/2021 0842   PHURINE 6.0 05/14/2021 0842   GLUCOSEU NEGATIVE 05/14/2021 0842   HGBUR NEGATIVE 05/14/2021 0842   BILIRUBINUR NEGATIVE 05/14/2021 0842   KETONESUR NEGATIVE 05/14/2021 0842   PROTEINUR NEGATIVE 05/14/2021 0842   NITRITE NEGATIVE 05/14/2021 0842   LEUKOCYTESUR NEGATIVE 05/14/2021 0842   Sepsis Labs Invalid input(s): PROCALCITONIN,  WBC,  LACTICIDVEN Microbiology Recent Results (from the past 240 hour(s))  SARS CORONAVIRUS 2 (TAT 6-24 HRS) Nasopharyngeal Nasopharyngeal Swab     Status: None   Collection Time: 05/14/21  9:38 AM   Specimen: Nasopharyngeal Swab  Result Value Ref Range Status   SARS Coronavirus 2 NEGATIVE NEGATIVE Final    Comment: (NOTE) SARS-CoV-2 target nucleic acids are NOT DETECTED.  The SARS-CoV-2 RNA is generally detectable in upper and  lower respiratory specimens during the acute phase of infection. Negative results do not preclude SARS-CoV-2 infection, do not rule out co-infections with other pathogens, and should not be used as the sole basis for treatment or other patient management decisions. Negative results must be combined with clinical observations, patient history, and epidemiological information. The expected result is Negative.  Fact Sheet for Patients: SugarRoll.be  Fact Sheet for Healthcare Providers: https://www.woods-mathews.com/  This test is not yet approved or cleared by the [place] FDA and  has been authorized for detection and/or diagnosis of SARS-CoV-2 by FDA under an Emergency Use Authorization (EUA). This EUA will remain  in effect (meaning this test can be used) for the duration of the COVID-19 declaration under Se ction 564(b)(1) of the Act, 21 U.S.C. section 360bbb-3(b)(1), unless the authorization is terminated or revoked sooner.  Performed at [facility], [place] [address] [place], [place] [address]   SARS CORONAVIRUS 2 (TAT 6-24 HRS) Nasopharyngeal Nasopharyngeal Swab     Status: None   Collection Time: 05/19/21  7:40 AM   Specimen: Nasopharyngeal Swab  Result Value Ref Range Status   SARS Coronavirus 2 NEGATIVE NEGATIVE Final    Comment: (NOTE) SARS-CoV-2 target nucleic acids are NOT DETECTED.  The SARS-CoV-2 RNA is generally detectable in upper and lower respiratory specimens during the acute phase of infection. Negative results do not preclude SARS-CoV-2 infection, do  not rule out co-infections with other pathogens, and should not be used as the sole basis for treatment or other patient management decisions. Negative results must be combined with clinical observations, patient history, and epidemiological information. The expected result is Negative.  Fact Sheet for  Patients: SugarRoll.be  Fact Sheet for Healthcare Providers: https://www.woods-mathews.com/  This test is not yet approved or cleared by the [place] FDA and  has been authorized for detection and/or diagnosis of SARS-CoV-2 by FDA under an Emergency Use Authorization (EUA). This EUA will remain  in effect (meaning this test can be used) for the duration of the COVID-19 declaration under Se ction 564(b)(1) of the Act, 21 U.S.C. section 360bbb-3(b)(1), unless the authorization is terminated or revoked sooner.  Performed at [facility], [place] [address] [place], [place] [address]      Time coordinating discharge: Over 30 minutes  SIGNED:   Darliss Cheney, MD  [facility] 05/20/2021, 11:00 AM  If 7PM-7AM, please contact night-coverage [email or web address]

## 2021-05-22 ENCOUNTER — Ambulatory Visit: Payer: Medicare PPO | Admitting: Pulmonary Disease

## 2021-05-25 ENCOUNTER — Inpatient Hospital Stay: Payer: Medicare PPO

## 2021-05-25 ENCOUNTER — Telehealth: Payer: Self-pay | Admitting: Internal Medicine

## 2021-05-25 ENCOUNTER — Other Ambulatory Visit: Payer: Self-pay | Admitting: Physician Assistant

## 2021-05-25 ENCOUNTER — Other Ambulatory Visit: Payer: Self-pay

## 2021-05-25 DIAGNOSIS — C778 Secondary and unspecified malignant neoplasm of lymph nodes of multiple regions: Secondary | ICD-10-CM | POA: Diagnosis not present

## 2021-05-25 DIAGNOSIS — C3412 Malignant neoplasm of upper lobe, left bronchus or lung: Secondary | ICD-10-CM | POA: Diagnosis not present

## 2021-05-25 DIAGNOSIS — Z5111 Encounter for antineoplastic chemotherapy: Secondary | ICD-10-CM | POA: Diagnosis not present

## 2021-05-25 DIAGNOSIS — Z95828 Presence of other vascular implants and grafts: Secondary | ICD-10-CM

## 2021-05-25 DIAGNOSIS — D6481 Anemia due to antineoplastic chemotherapy: Secondary | ICD-10-CM | POA: Diagnosis not present

## 2021-05-25 DIAGNOSIS — D649 Anemia, unspecified: Secondary | ICD-10-CM

## 2021-05-25 DIAGNOSIS — Z5112 Encounter for antineoplastic immunotherapy: Secondary | ICD-10-CM | POA: Diagnosis not present

## 2021-05-25 DIAGNOSIS — Z79899 Other long term (current) drug therapy: Secondary | ICD-10-CM | POA: Diagnosis not present

## 2021-05-25 DIAGNOSIS — J910 Malignant pleural effusion: Secondary | ICD-10-CM | POA: Diagnosis not present

## 2021-05-25 DIAGNOSIS — C3492 Malignant neoplasm of unspecified part of left bronchus or lung: Secondary | ICD-10-CM

## 2021-05-25 DIAGNOSIS — C787 Secondary malignant neoplasm of liver and intrahepatic bile duct: Secondary | ICD-10-CM | POA: Diagnosis not present

## 2021-05-25 LAB — CMP (CANCER CENTER ONLY)
ALT: 18 U/L (ref 0–44)
AST: 21 U/L (ref 15–41)
Albumin: 2.6 g/dL — ABNORMAL LOW (ref 3.5–5.0)
Alkaline Phosphatase: 92 U/L (ref 38–126)
Anion gap: 8 (ref 5–15)
BUN: 12 mg/dL (ref 8–23)
CO2: 23 mmol/L (ref 22–32)
Calcium: 8.9 mg/dL (ref 8.9–10.3)
Chloride: 103 mmol/L (ref 98–111)
Creatinine: 0.81 mg/dL (ref 0.61–1.24)
GFR, Estimated: >60 mL/min (ref >60)
Glucose, Bld: 112 mg/dL — ABNORMAL HIGH (ref 70–99)
Potassium: 3.9 mmol/L (ref 3.5–5.1)
Sodium: 134 mmol/L — ABNORMAL LOW (ref 135–145)
Total Bilirubin: 0.4 mg/dL (ref 0.3–1.2)
Total Protein: 6.1 g/dL — ABNORMAL LOW (ref 6.5–8.1)

## 2021-05-25 LAB — CBC WITH DIFFERENTIAL (CANCER CENTER ONLY)
Abs Immature Granulocytes: 1.59 10*3/uL — ABNORMAL HIGH (ref 0.00–0.07)
Basophils Absolute: 0.1 10*3/uL (ref 0.0–0.1)
Basophils Relative: 0 %
Eosinophils Absolute: 0.2 10*3/uL (ref 0.0–0.5)
Eosinophils Relative: 2 %
HCT: 24.1 % — ABNORMAL LOW (ref 39.0–52.0)
Hemoglobin: 7.9 g/dL — ABNORMAL LOW (ref 13.0–17.0)
Immature Granulocytes: 13 %
Lymphocytes Relative: 3 %
Lymphs Abs: 0.4 10*3/uL — ABNORMAL LOW (ref 0.7–4.0)
MCH: 29 pg (ref 26.0–34.0)
MCHC: 32.8 g/dL (ref 30.0–36.0)
MCV: 88.6 fL (ref 80.0–100.0)
Monocytes Absolute: 1.1 10*3/uL — ABNORMAL HIGH (ref 0.1–1.0)
Monocytes Relative: 9 %
Neutro Abs: 9.3 10*3/uL — ABNORMAL HIGH (ref 1.7–7.7)
Neutrophils Relative %: 73 %
Platelet Count: 86 10*3/uL — ABNORMAL LOW (ref 150–400)
RBC: 2.72 MIL/uL — ABNORMAL LOW (ref 4.22–5.81)
RDW: 20.5 % — ABNORMAL HIGH (ref 11.5–15.5)
WBC Count: 12.7 10*3/uL — ABNORMAL HIGH (ref 4.0–10.5)
nRBC: 0.8 % — ABNORMAL HIGH (ref 0.0–0.2)

## 2021-05-25 MED ORDER — HEPARIN SOD (PORK) LOCK FLUSH 100 UNIT/ML IV SOLN
500.0000 [IU] | Freq: Once | INTRAVENOUS | Status: AC
  Administered 2021-05-25: 500 [IU]

## 2021-05-25 MED ORDER — SODIUM CHLORIDE 0.9% FLUSH
10.0000 mL | Freq: Once | INTRAVENOUS | Status: AC
Start: 2021-05-25 — End: 2021-05-25
  Administered 2021-05-25: 10 mL

## 2021-05-25 NOTE — Telephone Encounter (Signed)
Scheduled appts per 8/22 sch msg. Pt aware.

## 2021-05-26 ENCOUNTER — Encounter: Payer: Self-pay | Admitting: Neurology

## 2021-05-26 ENCOUNTER — Telehealth: Payer: Self-pay | Admitting: Medical Oncology

## 2021-05-26 ENCOUNTER — Other Ambulatory Visit: Payer: Self-pay | Admitting: Medical Oncology

## 2021-05-26 ENCOUNTER — Inpatient Hospital Stay: Payer: Medicare PPO

## 2021-05-26 DIAGNOSIS — Z5112 Encounter for antineoplastic immunotherapy: Secondary | ICD-10-CM | POA: Diagnosis not present

## 2021-05-26 DIAGNOSIS — C3412 Malignant neoplasm of upper lobe, left bronchus or lung: Secondary | ICD-10-CM | POA: Diagnosis not present

## 2021-05-26 DIAGNOSIS — J910 Malignant pleural effusion: Secondary | ICD-10-CM | POA: Diagnosis not present

## 2021-05-26 DIAGNOSIS — D6481 Anemia due to antineoplastic chemotherapy: Secondary | ICD-10-CM | POA: Diagnosis not present

## 2021-05-26 DIAGNOSIS — D649 Anemia, unspecified: Secondary | ICD-10-CM

## 2021-05-26 DIAGNOSIS — C787 Secondary malignant neoplasm of liver and intrahepatic bile duct: Secondary | ICD-10-CM | POA: Diagnosis not present

## 2021-05-26 DIAGNOSIS — Z79899 Other long term (current) drug therapy: Secondary | ICD-10-CM | POA: Diagnosis not present

## 2021-05-26 DIAGNOSIS — C778 Secondary and unspecified malignant neoplasm of lymph nodes of multiple regions: Secondary | ICD-10-CM | POA: Diagnosis not present

## 2021-05-26 DIAGNOSIS — Z5111 Encounter for antineoplastic chemotherapy: Secondary | ICD-10-CM | POA: Diagnosis not present

## 2021-05-26 DIAGNOSIS — Z95828 Presence of other vascular implants and grafts: Secondary | ICD-10-CM

## 2021-05-26 LAB — PREPARE RBC (CROSSMATCH)

## 2021-05-26 MED ORDER — SODIUM CHLORIDE 0.9% FLUSH
10.0000 mL | Freq: Once | INTRAVENOUS | Status: AC
  Administered 2021-05-26: 10 mL

## 2021-05-26 NOTE — Telephone Encounter (Signed)
Faxed lab results from 08/22 to [facility] with receipt.

## 2021-05-27 ENCOUNTER — Other Ambulatory Visit: Payer: Self-pay

## 2021-05-27 ENCOUNTER — Inpatient Hospital Stay: Payer: Medicare PPO

## 2021-05-27 DIAGNOSIS — C787 Secondary malignant neoplasm of liver and intrahepatic bile duct: Secondary | ICD-10-CM | POA: Diagnosis not present

## 2021-05-27 DIAGNOSIS — Z5111 Encounter for antineoplastic chemotherapy: Secondary | ICD-10-CM | POA: Diagnosis not present

## 2021-05-27 DIAGNOSIS — J910 Malignant pleural effusion: Secondary | ICD-10-CM | POA: Diagnosis not present

## 2021-05-27 DIAGNOSIS — Z5112 Encounter for antineoplastic immunotherapy: Secondary | ICD-10-CM | POA: Diagnosis not present

## 2021-05-27 DIAGNOSIS — Z79899 Other long term (current) drug therapy: Secondary | ICD-10-CM | POA: Diagnosis not present

## 2021-05-27 DIAGNOSIS — D6481 Anemia due to antineoplastic chemotherapy: Secondary | ICD-10-CM | POA: Diagnosis not present

## 2021-05-27 DIAGNOSIS — D649 Anemia, unspecified: Secondary | ICD-10-CM

## 2021-05-27 DIAGNOSIS — C3412 Malignant neoplasm of upper lobe, left bronchus or lung: Secondary | ICD-10-CM | POA: Diagnosis not present

## 2021-05-27 DIAGNOSIS — C778 Secondary and unspecified malignant neoplasm of lymph nodes of multiple regions: Secondary | ICD-10-CM | POA: Diagnosis not present

## 2021-05-27 MED ORDER — ACETAMINOPHEN 325 MG PO TABS
650.0000 mg | ORAL_TABLET | Freq: Once | ORAL | Status: AC
  Administered 2021-05-27: 650 mg via ORAL
  Filled 2021-05-27: qty 2

## 2021-05-27 MED ORDER — SODIUM CHLORIDE 0.9% IV SOLUTION
250.0000 mL | Freq: Once | INTRAVENOUS | Status: AC
  Administered 2021-05-27: 250 mL via INTRAVENOUS

## 2021-05-27 MED ORDER — DIPHENHYDRAMINE HCL 25 MG PO CAPS
25.0000 mg | ORAL_CAPSULE | Freq: Once | ORAL | Status: AC
  Administered 2021-05-27: 25 mg via ORAL
  Filled 2021-05-27: qty 1

## 2021-05-27 MED ORDER — HEPARIN SOD (PORK) LOCK FLUSH 100 UNIT/ML IV SOLN
250.0000 [IU] | INTRAVENOUS | Status: AC | PRN
  Administered 2021-05-27: 250 [IU]

## 2021-05-27 MED ORDER — SODIUM CHLORIDE 0.9% FLUSH
10.0000 mL | INTRAVENOUS | Status: AC | PRN
  Administered 2021-05-27: 10 mL

## 2021-05-27 NOTE — Patient Instructions (Signed)
Blood Transfusion, Adult, Care After This sheet gives you information about how to care for yourself after your procedure. Your doctor may also give you more specific instructions. If youhave problems or questions, contact your doctor. What can I expect after the procedure? After the procedure, it is common to have: Bruising and soreness at the IV site. A fever or chills on the day of the procedure. This may be your body's response to the new blood cells received. A headache. Follow these instructions at home: Insertion site care     Follow instructions from your doctor about how to take care of your insertion site. This is where an IV tube was put into your vein. Make sure you: Wash your hands with soap and water before and after you change your bandage (dressing). If you cannot use soap and water, use hand sanitizer. Change your bandage as told by your doctor. Check your insertion site every day for signs of infection. Check for: Redness, swelling, or pain. Bleeding from the site. Warmth. Pus or a bad smell. General instructions Take over-the-counter and prescription medicines only as told by your doctor. Rest as told by your doctor. Go back to your normal activities as told by your doctor. Keep all follow-up visits as told by your doctor. This is important. Contact a doctor if: You have itching or red, swollen areas of skin (hives). You feel worried or nervous (anxious). You feel weak after doing your normal activities. You have redness, swelling, warmth, or pain around the insertion site. You have blood coming from the insertion site, and the blood does not stop with pressure. You have pus or a bad smell coming from the insertion site. Get help right away if: You have signs of a serious reaction. This may be coming from an allergy or the body's defense system (immune system). Signs include: Trouble breathing or shortness of breath. Swelling of the face or feeling warm  (flushed). Fever or chills. Head, chest, or back pain. Dark pee (urine) or blood in the pee. Widespread rash. Fast heartbeat. Feeling dizzy or light-headed. You may receive your blood transfusion in an outpatient setting. If so, youwill be told whom to contact to report any reactions. These symptoms may be an emergency. Do not wait to see if the symptoms will go away. Get medical help right away. Call your local emergency services (911 in the [place] Do not drive yourself to the hospital. Summary Bruising and soreness at the IV site are common. Check your insertion site every day for signs of infection. Rest as told by your doctor. Go back to your normal activities as told by your doctor. Get help right away if you have signs of a serious reaction. This information is not intended to replace advice given to you by your health care provider. Make sure you discuss any questions you have with your healthcare provider. Document Revised: 03/15/2019 Document Reviewed: 03/15/2019 Elsevier Patient Education  2022 [employer].  

## 2021-05-28 LAB — BPAM RBC
Blood Product Expiration Date: 202209262359
Blood Product Expiration Date: 202209262359
ISSUE DATE / TIME: 202208240749
ISSUE DATE / TIME: 202208240749
Unit Type and Rh: 5100
Unit Type and Rh: 5100

## 2021-05-28 LAB — TYPE AND SCREEN
ABO/RH(D): O POS
Antibody Screen: NEGATIVE
Unit division: 0
Unit division: 0

## 2021-05-28 NOTE — Progress Notes (Signed)
Carelink Summary Report / Loop Recorder 

## 2021-05-29 DIAGNOSIS — I482 Chronic atrial fibrillation, unspecified: Secondary | ICD-10-CM | POA: Diagnosis not present

## 2021-05-29 DIAGNOSIS — C3490 Malignant neoplasm of unspecified part of unspecified bronchus or lung: Secondary | ICD-10-CM | POA: Diagnosis not present

## 2021-05-29 DIAGNOSIS — Z7409 Other reduced mobility: Secondary | ICD-10-CM | POA: Diagnosis not present

## 2021-05-29 DIAGNOSIS — I2510 Atherosclerotic heart disease of native coronary artery without angina pectoris: Secondary | ICD-10-CM | POA: Diagnosis not present

## 2021-05-29 DIAGNOSIS — D6481 Anemia due to antineoplastic chemotherapy: Secondary | ICD-10-CM | POA: Diagnosis not present

## 2021-05-29 DIAGNOSIS — T451X5A Adverse effect of antineoplastic and immunosuppressive drugs, initial encounter: Secondary | ICD-10-CM | POA: Diagnosis not present

## 2021-05-29 DIAGNOSIS — R531 Weakness: Secondary | ICD-10-CM | POA: Diagnosis not present

## 2021-06-01 ENCOUNTER — Encounter: Payer: Self-pay | Admitting: Internal Medicine

## 2021-06-01 ENCOUNTER — Inpatient Hospital Stay: Payer: Medicare PPO

## 2021-06-01 ENCOUNTER — Inpatient Hospital Stay (HOSPITAL_BASED_OUTPATIENT_CLINIC_OR_DEPARTMENT_OTHER): Payer: Medicare PPO | Admitting: Internal Medicine

## 2021-06-01 ENCOUNTER — Other Ambulatory Visit: Payer: Self-pay

## 2021-06-01 VITALS — BP 115/69 | HR 97 | Temp 97.2°F | Resp 20 | Ht 74.0 in | Wt 238.7 lb

## 2021-06-01 DIAGNOSIS — D649 Anemia, unspecified: Secondary | ICD-10-CM | POA: Diagnosis not present

## 2021-06-01 DIAGNOSIS — C3412 Malignant neoplasm of upper lobe, left bronchus or lung: Secondary | ICD-10-CM | POA: Diagnosis not present

## 2021-06-01 DIAGNOSIS — C3492 Malignant neoplasm of unspecified part of left bronchus or lung: Secondary | ICD-10-CM

## 2021-06-01 DIAGNOSIS — J910 Malignant pleural effusion: Secondary | ICD-10-CM | POA: Diagnosis not present

## 2021-06-01 DIAGNOSIS — Z5111 Encounter for antineoplastic chemotherapy: Secondary | ICD-10-CM | POA: Diagnosis not present

## 2021-06-01 DIAGNOSIS — C3432 Malignant neoplasm of lower lobe, left bronchus or lung: Secondary | ICD-10-CM

## 2021-06-01 DIAGNOSIS — C778 Secondary and unspecified malignant neoplasm of lymph nodes of multiple regions: Secondary | ICD-10-CM | POA: Diagnosis not present

## 2021-06-01 DIAGNOSIS — Z95828 Presence of other vascular implants and grafts: Secondary | ICD-10-CM

## 2021-06-01 DIAGNOSIS — Z79899 Other long term (current) drug therapy: Secondary | ICD-10-CM | POA: Diagnosis not present

## 2021-06-01 DIAGNOSIS — D6481 Anemia due to antineoplastic chemotherapy: Secondary | ICD-10-CM | POA: Diagnosis not present

## 2021-06-01 DIAGNOSIS — C787 Secondary malignant neoplasm of liver and intrahepatic bile duct: Secondary | ICD-10-CM | POA: Diagnosis not present

## 2021-06-01 DIAGNOSIS — Z5112 Encounter for antineoplastic immunotherapy: Secondary | ICD-10-CM | POA: Diagnosis not present

## 2021-06-01 LAB — CMP (CANCER CENTER ONLY)
ALT: 13 U/L (ref 0–44)
AST: 18 U/L (ref 15–41)
Albumin: 2.7 g/dL — ABNORMAL LOW (ref 3.5–5.0)
Alkaline Phosphatase: 62 U/L (ref 38–126)
Anion gap: 10 (ref 5–15)
BUN: 14 mg/dL (ref 8–23)
CO2: 21 mmol/L — ABNORMAL LOW (ref 22–32)
Calcium: 9.2 mg/dL (ref 8.9–10.3)
Chloride: 104 mmol/L (ref 98–111)
Creatinine: 0.76 mg/dL (ref 0.61–1.24)
GFR, Estimated: >60 mL/min (ref >60)
Glucose, Bld: 124 mg/dL — ABNORMAL HIGH (ref 70–99)
Potassium: 3.9 mmol/L (ref 3.5–5.1)
Sodium: 135 mmol/L (ref 135–145)
Total Bilirubin: 0.5 mg/dL (ref 0.3–1.2)
Total Protein: 6.5 g/dL (ref 6.5–8.1)

## 2021-06-01 LAB — CBC WITH DIFFERENTIAL (CANCER CENTER ONLY)
Abs Immature Granulocytes: 0.23 10*3/uL — ABNORMAL HIGH (ref 0.00–0.07)
Basophils Absolute: 0.1 10*3/uL (ref 0.0–0.1)
Basophils Relative: 1 %
Eosinophils Absolute: 0.2 10*3/uL (ref 0.0–0.5)
Eosinophils Relative: 4 %
HCT: 30.4 % — ABNORMAL LOW (ref 39.0–52.0)
Hemoglobin: 10 g/dL — ABNORMAL LOW (ref 13.0–17.0)
Immature Granulocytes: 4 %
Lymphocytes Relative: 6 %
Lymphs Abs: 0.3 10*3/uL — ABNORMAL LOW (ref 0.7–4.0)
MCH: 29.5 pg (ref 26.0–34.0)
MCHC: 32.9 g/dL (ref 30.0–36.0)
MCV: 89.7 fL (ref 80.0–100.0)
Monocytes Absolute: 0.7 10*3/uL (ref 0.1–1.0)
Monocytes Relative: 12 %
Neutro Abs: 4.3 10*3/uL (ref 1.7–7.7)
Neutrophils Relative %: 73 %
Platelet Count: 217 10*3/uL (ref 150–400)
RBC: 3.39 MIL/uL — ABNORMAL LOW (ref 4.22–5.81)
RDW: 20.6 % — ABNORMAL HIGH (ref 11.5–15.5)
WBC Count: 5.8 10*3/uL (ref 4.0–10.5)
nRBC: 0 % (ref 0.0–0.2)

## 2021-06-01 MED ORDER — SODIUM CHLORIDE 0.9 % IV SOLN: Freq: Once | INTRAVENOUS | Status: AC

## 2021-06-01 MED ORDER — SODIUM CHLORIDE 0.9 % IV SOLN
400.0000 mg/m2 | Freq: Once | INTRAVENOUS | Status: AC
  Administered 2021-06-01: 900 mg via INTRAVENOUS
  Filled 2021-06-01: qty 20

## 2021-06-01 MED ORDER — SODIUM CHLORIDE 0.9 % IV SOLN
10.0000 mg | Freq: Once | INTRAVENOUS | Status: AC
  Administered 2021-06-01: 10 mg via INTRAVENOUS
  Filled 2021-06-01: qty 10

## 2021-06-01 MED ORDER — SODIUM CHLORIDE 0.9% FLUSH
10.0000 mL | Freq: Once | INTRAVENOUS | Status: AC
Start: 2021-06-01 — End: 2021-06-01
  Administered 2021-06-01: 10 mL

## 2021-06-01 MED ORDER — SODIUM CHLORIDE 0.9 % IV SOLN
200.0000 mg | Freq: Once | INTRAVENOUS | Status: AC
  Administered 2021-06-01: 200 mg via INTRAVENOUS
  Filled 2021-06-01: qty 8

## 2021-06-01 MED ORDER — SODIUM CHLORIDE 0.9 % IV SOLN
498.4000 mg | Freq: Once | INTRAVENOUS | Status: AC
  Administered 2021-06-01: 500 mg via INTRAVENOUS
  Filled 2021-06-01: qty 50

## 2021-06-01 MED ORDER — SODIUM CHLORIDE 0.9 % IV SOLN
150.0000 mg | Freq: Once | INTRAVENOUS | Status: AC
  Administered 2021-06-01: 150 mg via INTRAVENOUS
  Filled 2021-06-01: qty 150

## 2021-06-01 MED ORDER — PALONOSETRON HCL INJECTION 0.25 MG/5ML
0.2500 mg | Freq: Once | INTRAVENOUS | Status: AC
  Administered 2021-06-01: 0.25 mg via INTRAVENOUS
  Filled 2021-06-01: qty 5

## 2021-06-01 NOTE — Patient Instructions (Signed)
[facility]  Discharge Instructions: Thank you for choosing [facility] to provide your oncology and hematology care.   If you have a lab appointment with the [facility], please go directly to the [facility] and check in at the registration area.   Wear comfortable clothing and clothing appropriate for easy access to any Portacath or PICC line.   We strive to give you quality time with your provider. You may need to reschedule your appointment if you arrive late (15 or more minutes).  Arriving late affects you and other patients whose appointments are after yours.  Also, if you miss three or more appointments without notifying the office, you may be dismissed from the clinic at the provider's discretion.      For prescription refill requests, have your pharmacy contact our office and allow 72 hours for refills to be completed.    Today you received the following chemotherapy and/or immunotherapy agents carboplatin, keytruda, alimta      To help prevent nausea and vomiting after your treatment, we encourage you to take your nausea medication as directed.  BELOW ARE SYMPTOMS THAT SHOULD BE REPORTED IMMEDIATELY: *FEVER GREATER THAN 100.4 F (38 C) OR HIGHER *CHILLS OR SWEATING *NAUSEA AND VOMITING THAT IS NOT CONTROLLED WITH YOUR NAUSEA MEDICATION *UNUSUAL SHORTNESS OF BREATH *UNUSUAL BRUISING OR BLEEDING *URINARY PROBLEMS (pain or burning when urinating, or frequent urination) *BOWEL PROBLEMS (unusual diarrhea, constipation, pain near the anus) TENDERNESS IN MOUTH AND THROAT WITH OR WITHOUT PRESENCE OF ULCERS (sore throat, sores in mouth, or a toothache) UNUSUAL RASH, SWELLING OR PAIN  UNUSUAL VAGINAL DISCHARGE OR ITCHING   Items with * indicate a potential emergency and should be followed up as soon as possible or go to the Emergency Department if any problems should occur.  Please show the CHEMOTHERAPY ALERT CARD or IMMUNOTHERAPY ALERT  CARD at check-in to the Emergency Department and triage nurse.  Should you have questions after your visit or need to cancel or reschedule your appointment, please contact [facility]  Dept: [phone]  and follow the prompts.  Office hours are 8:00 a.m. to 4:30 p.m. Monday - Friday. Please note that voicemails left after 4:00 p.m. may not be returned until the following business day.  We are closed weekends and major holidays. You have access to a nurse at all times for urgent questions. Please call the main number to the clinic Dept: [phone] and follow the prompts.   For any non-urgent questions, you may also contact your provider using MyChart. We now offer e-Visits for anyone 48 and older to request care online for non-urgent symptoms. For details visit [email or web address].   Also download the MyChart app! Go to the app store, search "MyChart", open the app, select [facility], and log in with your MyChart username and password.  Due to Covid, a mask is required upon entering the hospital/clinic. If you do not have a mask, one will be given to you upon arrival. For doctor visits, patients may have 1 support person aged 68 or older with them. For treatment visits, patients cannot have anyone with them due to current Covid guidelines and our immunocompromised population.

## 2021-06-01 NOTE — Progress Notes (Signed)
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     Molecular Studies by [employer]: [id number] 0.5%   Olaparib Yes [id number] 50.9%   Olaparib Yes [id number] 0.8% None      PDL1: 30%   PRIOR THERAPY: 1) Palliative radiotherapy to the obstructive lung mass in the left lung under the care of Dr. MTammi Klippel Last treatment 03/23/21   CURRENT THERAPY: Palliative systemic chemotherapy with carboplatin AUC 5, Alimta 500 mg per metered squared, Keytruda 200 mg IV every 3 weeks.  First dose expected on 03/31/21. Status post 3 cycles.   INTERVAL HISTORY: Corey [id number].[id number]. male returns to the clinic today for follow-up visit accompanied by his wife.  The patient was admitted to the hospital after cycle #3 of his treatment with significant fatigue and weakness.  His hemoglobin was down to 6.5.  The patient received 2 units of PRBCs transfusion. He was also evaluated by physical therapy and was sent to a rehabilitation center after discharge for physical therapy.  He is feeling much better today.  He denied having any current chest pain, shortness of breath except with exertion with no cough or hemoptysis.  He denied having any fever or chills.  He has no nausea, vomiting, diarrhea or constipation.  He was supposed to have repeat CT scan of the chest before this visit but unfortunately it is scheduled to be done tomorrow.  He is here today for evaluation before starting cycle #4 of his treatment.  MEDICAL HISTORY: Past Medical History:  Diagnosis Date   Anemia     Anxiety    Arthritis    "back, right knee, hands, ankles, neck" (05/31/2016)   Carotid artery disease ([place] 08/30/2016   Carotid [place]: R 1-39, L 40-59 // Carotid [place]/2020: R 1-39; L 40-59 // Carotid [place]21: R 1-39; L 40-59>> repeat 1 year    Chronic lower back pain    Coronary atherosclerosis of native coronary artery    [facility], otherwise mild nonobstructive disease. EF normal.   Diverticulitis    Dyslipidemia    Dyspnea    Essential hypertension, benign    Family history of breast cancer    Family history of lung cancer    Family history of prostate cancer    GERD (gastroesophageal reflux disease)    Lumbar radiculopathy, chronic 02/04/2015   Right L5   Obesity    OSA on CPAP    uses cpap   Paroxysmal atrial fibrillation ([facility]    a. Discovered after stroke.   Pneumonia 01/1996   PONV (postoperative nausea and vomiting)    Prostate CA ([facility]    Recurrent upper respiratory infection (URI)    Stroke ([facility] 07/2012   no deficits   Visit for monitoring Tikosyn therapy 09/18/2019    ALLERGIES:  is allergic to amoxicillin, fosinopril, hydromorphone, ampicillin, monopril [fosinopril sodium], other, and rosuvastatin.  MEDICATIONS:  Current Outpatient Medications  Medication Sig Dispense Refill   acetaminophen (TYLENOL 8 HOUR ARTHRITIS PAIN) 650 MG CR tablet Take 1,300 mg by mouth every 8 (eight) hours  as needed for pain (pain).     albuterol (VENTOLIN HFA) 108 (90 Base) MCG/ACT inhaler Inhale 2 puffs into the lungs every 4 (four) hours as needed for wheezing or shortness of breath. Inhale 2 puffs into the lungs every 4 hours as needed 18 g 0   ALPRAZolam (XANAX) 0.5 MG tablet Take 1 tablet (0.5 mg total) by mouth at bedtime as needed for anxiety. 10 tablet 0   atorvastatin (LIPITOR) 40 MG tablet Take 1 tablet (40 mg total) by mouth daily. 90 tablet 3   benzonatate (TESSALON) 200 MG capsule Take 1 capsule (200 mg total) by mouth 3 (three) times daily as  needed for cough. 60 capsule 1   Carbinoxamine Maleate 6 MG TABS Take 1 tablet by mouth 2 (two) times daily as needed (drainage). 60 tablet 4   chlorpheniramine-HYDROcodone (TUSSIONEX) 10-8 MG/5ML SUER Take 5 mLs by mouth every 12 (twelve) hours as needed for cough. 473 mL 0   diclofenac Sodium (VOLTAREN) 1 % GEL Apply 4 g topically 4 (four) times daily. 100 g 0   dicyclomine (BENTYL) 20 MG tablet Take 20 mg by mouth daily as needed for spasms.     diltiazem (CARDIZEM) 30 MG tablet Take 1 tablet (30 mg total) by mouth 4 (four) times daily as needed. 60 tablet 6   dofetilide (TIKOSYN) 500 MCG capsule Take 1 capsule (500 mcg total) by mouth 2 (two) times daily. Please keep upcoming appointment in February for future refills. Thank you 180 capsule 3   fenofibrate 160 MG tablet TAKE 1 TABLET DAILY. PLEASE KEEP UPCOMING APPT IN MARCH WITH DR. Acie Fredrickson BEFORE ANYMORE REFILLS. (Patient taking differently: Take 160 mg by mouth daily.) 90 tablet 3   fluticasone (FLONASE) 50 MCG/ACT nasal spray Place 2 sprays into both nostrils daily. 40.1 mL 5   folic acid (FOLVITE) 1 MG tablet Take 1 tablet (1 mg total) by mouth daily. 30 tablet 2   ipratropium (ATROVENT) 0.03 % nasal spray Place 1-2 sprays into both nostrils every 12 (twelve) hours. For drainage 30 mL 5   leuprolide, 6 Month, (ELIGARD) 45 MG injection Inject 45 mg into the skin every 6 (six) months.     lidocaine-prilocaine (EMLA) cream Apply 1 application topically as needed. 30 g 2   loperamide (IMODIUM) 2 MG capsule Take 2 mg by mouth daily as needed for diarrhea or loose stools.     magnesium oxide (MAG-OX) 400 MG tablet TAKE 1 TABLET BY MOUTH EVERY DAY (Patient taking differently: Take 400 mg by mouth daily.) 90 tablet 2   metoprolol succinate (TOPROL XL) 25 MG 24 hr tablet Take 1 tablet (25 mg total) by mouth daily. 90 tablet 3   Multiple Vitamins-Minerals (MULTIVITAMINS THER. W/MINERALS) TABS Take 1 tablet by mouth daily.     nitroGLYCERIN (NITROSTAT)  0.4 MG SL tablet Place 1 tablet (0.4 mg total) under the tongue every 5 (five) minutes as needed for chest pain. 25 tablet 5   omeprazole (PRILOSEC) 20 MG capsule Take 1 capsule (20 mg total) by mouth daily. 30 capsule 5   ondansetron (ZOFRAN) 8 MG tablet Take 1 tablet (8 mg total) by mouth every 8 (eight) hours as needed for nausea or vomiting. Starting 3 days after chemotherapy 30 tablet 2   potassium chloride (KLOR-CON) 10 MEQ tablet Take 1 tablet (10 mEq total) by mouth daily. 90 tablet 2   promethazine (PHENERGAN) 25 MG tablet Take 1 tablet (25 mg total) by mouth every 6 (six) hours as needed  for nausea or vomiting. 30 tablet 1   Saccharomyces boulardii (PROBIOTIC) 250 MG CAPS Take 1 capsule by mouth daily.     sucralfate (CARAFATE) 1 GM/10ML suspension Take 10 mLs (1 g total) by mouth 4 (four) times daily. Before meals 420 mL 1   tamsulosin (FLOMAX) 0.4 MG CAPS capsule Take 0.4 mg by mouth daily after supper.     Tiotropium Bromide-Olodaterol (STIOLTO RESPIMAT) 2.5-2.5 MCG/ACT AERS Inhale 2 puffs into the lungs daily. 4 g 5   topiramate (TOPAMAX) 50 MG tablet Take 2 tablets (100 mg total) by mouth 2 (two) times daily. 360 tablet 3   traMADol (ULTRAM) 50 MG tablet Take 1 tablet (50 mg total) by mouth every 6 (six) hours as needed for moderate pain. 30 tablet 0   traZODone (DESYREL) 50 MG tablet Take 0.5 tablets (25 mg total) by mouth at bedtime as needed for sleep. 45 tablet 3   Vitamin D, Ergocalciferol, (DRISDOL) 1.25 MG (50000 UNIT) CAPS capsule TAKE 1 CAPSULE BY MOUTH ONE TIME PER WEEK (Patient taking differently: Take 50,000 Units by mouth every 7 (seven) days. Monday) 12 capsule 1   XARELTO 20 MG TABS tablet TAKE 1 TABLET BY MOUTH EVERY DAY (Patient taking differently: 20 mg daily with supper.) 90 tablet 2   No current facility-administered medications for this visit.    SURGICAL HISTORY:  Past Surgical History:  Procedure Laterality Date   ADENOIDECTOMY     ANTERIOR CERVICAL  DECOMP/DISCECTOMY FUSION  07/2001; 10/2002   "C5-6; C6-7; redo"   BACK SURGERY     CARPAL TUNNEL RELEASE Left 10/2015   CHEST TUBE INSERTION Left 03/17/2021   Procedure: INSERTION PLEURAL DRAINAGE CATHETER;  Surgeon: Garner Nash, DO;  Location: [facility];  Service: Pulmonary;  Laterality: Left;  Indwelling Tunneled Pleural catheter (PLEUREX)    COLONOSCOPY W/ POLYPECTOMY  02/2014   CORONARY ANGIOPLASTY WITH STENT PLACEMENT  05/2003; 12/2005   "mid RCA; mid RCA"   FINE NEEDLE ASPIRATION  02/26/2021   Procedure: FINE NEEDLE ASPIRATION (FNA) LINEAR;  Surgeon: Candee Furbish, MD;  [facility] ENDOSCOPY;  Service: Pulmonary;;   HAND SURGERY  02/2019   LEFT HAND   implantable loop recorder placement  02/27/2020   [employer]tr[employer] Reveal [place] model [id number]  implantable loop recorder implanted by Dr Rayann Heman for afib management and evaluation of presyncope   IR IMAGING GUIDED PORT INSERTION  04/16/2021   JOINT REPLACEMENT     KNEE ARTHROSCOPY Left 10/2005   KNEE ARTHROSCOPY W/ PARTIAL MEDIAL MENISCECTOMY Left 09/2005   LUMBAR LAMINECTOMY/DECOMPRESSION MICRODISCECTOMY  03/2005   "L4-5"   POSTERIOR LUMBAR FUSION  10/2003   L5-S1; "plates, screws"   SHOULDER ARTHROSCOPY Right 08/2011   Debridement of labrum, arthroscopic distal clavicle excision   SHOULDER OPEN ROTATOR CUFF REPAIR Left 07/2014   TEE WITHOUT CARDIOVERSION  07/07/2012   Procedure: TRANSESOPHAGEAL ECHOCARDIOGRAM (TEE);  Surgeon: Fay Records, MD;  Location: [facility] ENDOSCOPY;  Service: Cardiovascular;  Laterality: N/A;   THORACENTESIS N/A 02/25/2021   Procedure: [facility] Robinsons;  Surgeon: Juanito Doom, MD;  Location: [facility];  Service: Cardiopulmonary;  Laterality: N/A;   TONSILLECTOMY AND ADENOIDECTOMY  ~ 1956   TOTAL KNEE ARTHROPLASTY Left 10/2006   TRIGGER FINGER RELEASE Left 10/2015   VIDEO BRONCHOSCOPY WITH ENDOBRONCHIAL ULTRASOUND Left 02/26/2021   Procedure: VIDEO BRONCHOSCOPY WITH ENDOBRONCHIAL ULTRASOUND;  Surgeon:  Candee Furbish, MD;  Location: [facility] ENDOSCOPY;  Service: Pulmonary;  Laterality: Left;  cryoprobe too thanks!    REVIEW OF  SYSTEMS:  A comprehensive review of systems was negative except for: Constitutional: positive for fatigue Respiratory: positive for dyspnea on exertion Musculoskeletal: positive for muscle weakness   PHYSICAL EXAMINATION: General appearance: alert, cooperative, fatigued, and no distress Head: Normocephalic, without obvious abnormality, atraumatic Neck: no adenopathy, no JVD, supple, symmetrical, trachea midline, and thyroid not enlarged, symmetric, no tenderness/mass/nodules Lymph nodes: Cervical, supraclavicular, and axillary nodes normal. Resp: clear to auscultation bilaterally Back: symmetric, no curvature. ROM normal. No CVA tenderness. Cardio: regular rate and rhythm, S1, S2 normal, no murmur, click, rub or gallop GI: soft, non-tender; bowel sounds normal; no masses,  no organomegaly Extremities: extremities normal, atraumatic, no cyanosis or edema  ECOG PERFORMANCE STATUS: 1 - Symptomatic but completely ambulatory  Blood pressure 115/69, pulse 97, temperature (!) 97.2 F (36.2 C), temperature source Tympanic, resp. rate 20, height _0  (1.88 m), weight 238 lb 11.2 oz (108.3 kg), SpO2 96 %.  LABORATORY DATA: Lab Results  Component Value Date   WBC 12.7 (H) 05/25/2021   HGB 7.9 (L) 05/25/2021   HCT 24.1 (L) 05/25/2021   MCV 88.6 05/25/2021   PLT 86 (L) 05/25/2021      Chemistry      Component Value Date/Time   NA 134 (L) 05/25/2021 1052   NA 137 01/21/2021 1553   K 3.9 05/25/2021 1052   CL 103 05/25/2021 1052   CO2 23 05/25/2021 1052   BUN 12 05/25/2021 1052   BUN 17 01/21/2021 1553   CREATININE 0.81 05/25/2021 1052   CREATININE 1.04 08/30/2016 0840      Component Value Date/Time   CALCIUM 8.9 05/25/2021 1052   ALKPHOS 92 05/25/2021 1052   AST 21 05/25/2021 1052   ALT 18 05/25/2021 1052   BILITOT 0.4 05/25/2021 1052       RADIOGRAPHIC  STUDIES: DG CHEST PORT 1 VIEW  Result Date: 05/20/2021 CLINICAL DATA:  PleurX removal. EXAM: PORTABLE CHEST 1 VIEW COMPARISON:  Chest x-ray dated March 20, 2021. FINDINGS: Interval left PleurX catheter removal. Right chest wall port catheter with tip in the distal SVC. Relatively unchanged left lower lobe consolidation, pleural thickening, and effusion. No pneumothorax. The right lung is clear. Normal heart size. Loop recorder again noted. IMPRESSION: 1. Interval left PleurX catheter removal.  No pneumothorax. 2. Relatively unchanged left lower lobe consolidation, pleural thickening, and effusion related to known underlying malignancy. Electronically Signed   By: Titus Dubin M.D.   On: 05/20/2021 12:29    ASSESSMENT AND PLAN: This is a very pleasant 75 years old white male recently diagnosed with stage IV (T1b, N2, M1c) non-small cell lung cancer favoring adenocarcinoma presented with left lung mass in addition to left hilar adenopathy and malignant left pleural effusion, intrathoracic and upper abdominal lymph nodes, left pleural/subpleural nodularity and hepatic lesions diagnosed in May 2022. He has no actionable mutation and PD-L1 expression of 30%. He is status post palliative radiotherapy to the obstructive lung mass in the left lung under the care of Dr. Tammi Klippel.  The patient is currently undergoing systemic chemotherapy with carboplatin initially for AUC of 5, Alimta 500 Mg/M2 and Keytruda 200 Mg IV every 3 weeks status post 3 cycles.  Starting from cycle #2 the patient is on treatment with carboplatin for AUC of 4 and Alimta 400 Mg/M2.  His dose was reduced secondary to intolerance during cycle #1. The patient was admitted to the hospital after cycle #3 secondary to chemotherapy-induced anemia requiring PRBCs transfusion.  He is feeling much better now. He was  supposed to have repeat CT scan of the chest, abdomen and pelvis before this visit but unfortunately it is scheduled to be done  tomorrow. I recommended for the patient to proceed with cycle #4 of his treatment today as planned. I will see him back for follow-up visit in 3 weeks for evaluation before starting cycle #5 if the scan showed no evidence for disease progression. For the nausea he will continue his treatment with Zofran on as-needed basis. For the cough, I will give him refill of Tessalon Perles. For the constipation he was advised to use MiraLAX in addition to the stool softener. The patient was advised to call immediately if he has any other concerning symptoms in the interval. The patient voices understanding of current disease status and treatment options and is in agreement with the current care plan.  All questions were answered. The patient knows to call the clinic with any problems, questions or concerns. We can certainly see the patient much sooner if necessary.   Disclaimer: This note was dictated with voice recognition software. Similar sounding words can inadvertently be transcribed and may not be corrected upon review.

## 2021-06-02 ENCOUNTER — Ambulatory Visit (HOSPITAL_COMMUNITY)
Admission: RE | Admit: 2021-06-02 | Discharge: 2021-06-02 | Disposition: A | Discharge status: Home / Self Care | Payer: Medicare PPO | Source: Ambulatory Visit | Attending: Internal Medicine | Admitting: Internal Medicine

## 2021-06-02 DIAGNOSIS — C3490 Malignant neoplasm of unspecified part of unspecified bronchus or lung: Secondary | ICD-10-CM | POA: Diagnosis not present

## 2021-06-02 DIAGNOSIS — C787 Secondary malignant neoplasm of liver and intrahepatic bile duct: Secondary | ICD-10-CM | POA: Diagnosis not present

## 2021-06-02 DIAGNOSIS — R590 Localized enlarged lymph nodes: Secondary | ICD-10-CM | POA: Diagnosis not present

## 2021-06-02 MED ORDER — HEPARIN SOD (PORK) LOCK FLUSH 100 UNIT/ML IV SOLN: 500.0000 [IU] | Freq: Once | INTRAVENOUS | Status: AC

## 2021-06-02 MED ORDER — IOHEXOL 350 MG/ML SOLN
80.0000 mL | Freq: Once | INTRAVENOUS | Status: AC | PRN
  Administered 2021-06-02: 80 mL via INTRAVENOUS

## 2021-06-02 MED ORDER — HEPARIN SOD (PORK) LOCK FLUSH 100 UNIT/ML IV SOLN
INTRAVENOUS | Status: AC
  Administered 2021-06-02: 500 [IU] via INTRAVENOUS
  Filled 2021-06-02: qty 5

## 2021-06-03 ENCOUNTER — Other Ambulatory Visit: Payer: Self-pay

## 2021-06-03 ENCOUNTER — Other Ambulatory Visit (HOSPITAL_COMMUNITY): Payer: Self-pay | Admitting: Internal Medicine

## 2021-06-03 ENCOUNTER — Inpatient Hospital Stay: Payer: Medicare PPO

## 2021-06-03 DIAGNOSIS — C778 Secondary and unspecified malignant neoplasm of lymph nodes of multiple regions: Secondary | ICD-10-CM | POA: Diagnosis not present

## 2021-06-03 DIAGNOSIS — Z5112 Encounter for antineoplastic immunotherapy: Secondary | ICD-10-CM | POA: Diagnosis not present

## 2021-06-03 DIAGNOSIS — Z79899 Other long term (current) drug therapy: Secondary | ICD-10-CM | POA: Diagnosis not present

## 2021-06-03 DIAGNOSIS — C3492 Malignant neoplasm of unspecified part of left bronchus or lung: Secondary | ICD-10-CM

## 2021-06-03 DIAGNOSIS — Z5111 Encounter for antineoplastic chemotherapy: Secondary | ICD-10-CM | POA: Diagnosis not present

## 2021-06-03 DIAGNOSIS — D6481 Anemia due to antineoplastic chemotherapy: Secondary | ICD-10-CM | POA: Diagnosis not present

## 2021-06-03 DIAGNOSIS — C787 Secondary malignant neoplasm of liver and intrahepatic bile duct: Secondary | ICD-10-CM | POA: Diagnosis not present

## 2021-06-03 DIAGNOSIS — C3412 Malignant neoplasm of upper lobe, left bronchus or lung: Secondary | ICD-10-CM | POA: Diagnosis not present

## 2021-06-03 DIAGNOSIS — J910 Malignant pleural effusion: Secondary | ICD-10-CM | POA: Diagnosis not present

## 2021-06-03 MED ORDER — PEGFILGRASTIM-CBQV 6 MG/0.6ML ~~LOC~~ SOSY
6.0000 mg | PREFILLED_SYRINGE | Freq: Once | SUBCUTANEOUS | Status: AC
  Administered 2021-06-03: 6 mg via SUBCUTANEOUS
  Filled 2021-06-03: qty 0.6

## 2021-06-03 NOTE — Telephone Encounter (Signed)
This is a A-Fib clinic pt 

## 2021-06-05 ENCOUNTER — Emergency Department (HOSPITAL_COMMUNITY): Payer: Medicare PPO

## 2021-06-05 ENCOUNTER — Ambulatory Visit (INDEPENDENT_AMBULATORY_CARE_PROVIDER_SITE_OTHER): Payer: Medicare PPO

## 2021-06-05 ENCOUNTER — Other Ambulatory Visit: Payer: Self-pay | Admitting: Hematology and Oncology

## 2021-06-05 ENCOUNTER — Encounter (HOSPITAL_COMMUNITY): Payer: Self-pay

## 2021-06-05 ENCOUNTER — Emergency Department (HOSPITAL_COMMUNITY)
Admission: EM | Admit: 2021-06-05 | Discharge: 2021-06-05 | Disposition: A | Discharge status: Home / Self Care | Payer: Medicare PPO | Attending: Emergency Medicine | Admitting: Emergency Medicine

## 2021-06-05 ENCOUNTER — Telehealth: Payer: Self-pay | Admitting: Medical Oncology

## 2021-06-05 ENCOUNTER — Ambulatory Visit: Payer: Medicare PPO

## 2021-06-05 DIAGNOSIS — R531 Weakness: Secondary | ICD-10-CM | POA: Insufficient documentation

## 2021-06-05 DIAGNOSIS — Z7901 Long term (current) use of anticoagulants: Secondary | ICD-10-CM | POA: Insufficient documentation

## 2021-06-05 DIAGNOSIS — Z8546 Personal history of malignant neoplasm of prostate: Secondary | ICD-10-CM | POA: Insufficient documentation

## 2021-06-05 DIAGNOSIS — J441 Chronic obstructive pulmonary disease with (acute) exacerbation: Secondary | ICD-10-CM | POA: Diagnosis not present

## 2021-06-05 DIAGNOSIS — R55 Syncope and collapse: Secondary | ICD-10-CM | POA: Diagnosis not present

## 2021-06-05 DIAGNOSIS — R0682 Tachypnea, not elsewhere classified: Secondary | ICD-10-CM | POA: Diagnosis not present

## 2021-06-05 DIAGNOSIS — Z20822 Contact with and (suspected) exposure to covid-19: Secondary | ICD-10-CM | POA: Insufficient documentation

## 2021-06-05 DIAGNOSIS — I2510 Atherosclerotic heart disease of native coronary artery without angina pectoris: Secondary | ICD-10-CM | POA: Diagnosis not present

## 2021-06-05 DIAGNOSIS — Z87891 Personal history of nicotine dependence: Secondary | ICD-10-CM | POA: Insufficient documentation

## 2021-06-05 DIAGNOSIS — Z955 Presence of coronary angioplasty implant and graft: Secondary | ICD-10-CM | POA: Diagnosis not present

## 2021-06-05 DIAGNOSIS — R0689 Other abnormalities of breathing: Secondary | ICD-10-CM | POA: Diagnosis not present

## 2021-06-05 DIAGNOSIS — Z79899 Other long term (current) drug therapy: Secondary | ICD-10-CM | POA: Insufficient documentation

## 2021-06-05 DIAGNOSIS — I4891 Unspecified atrial fibrillation: Secondary | ICD-10-CM | POA: Insufficient documentation

## 2021-06-05 DIAGNOSIS — Z96652 Presence of left artificial knee joint: Secondary | ICD-10-CM | POA: Diagnosis not present

## 2021-06-05 DIAGNOSIS — R0602 Shortness of breath: Secondary | ICD-10-CM | POA: Diagnosis not present

## 2021-06-05 DIAGNOSIS — I10 Essential (primary) hypertension: Secondary | ICD-10-CM | POA: Insufficient documentation

## 2021-06-05 DIAGNOSIS — I517 Cardiomegaly: Secondary | ICD-10-CM | POA: Diagnosis not present

## 2021-06-05 LAB — CBC WITH DIFFERENTIAL/PLATELET
Abs Immature Granulocytes: 0 10*3/uL (ref 0.00–0.07)
Basophils Absolute: 0 10*3/uL (ref 0.0–0.1)
Basophils Relative: 0 %
Eosinophils Absolute: 0 10*3/uL (ref 0.0–0.5)
Eosinophils Relative: 0 %
HCT: 30.9 % — ABNORMAL LOW (ref 39.0–52.0)
Hemoglobin: 10.1 g/dL — ABNORMAL LOW (ref 13.0–17.0)
Lymphocytes Relative: 0 %
Lymphs Abs: 0 10*3/uL — ABNORMAL LOW (ref 0.7–4.0)
MCH: 29.9 pg (ref 26.0–34.0)
MCHC: 32.7 g/dL (ref 30.0–36.0)
MCV: 91.4 fL (ref 80.0–100.0)
Monocytes Absolute: 0.5 10*3/uL (ref 0.1–1.0)
Monocytes Relative: 2 %
Neutro Abs: 22.4 10*3/uL — ABNORMAL HIGH (ref 1.7–7.7)
Neutrophils Relative %: 98 %
Platelets: 218 10*3/uL (ref 150–400)
RBC: 3.38 MIL/uL — ABNORMAL LOW (ref 4.22–5.81)
RDW: 20.6 % — ABNORMAL HIGH (ref 11.5–15.5)
WBC: 22.9 10*3/uL — ABNORMAL HIGH (ref 4.0–10.5)
nRBC: 0 % (ref 0.0–0.2)

## 2021-06-05 LAB — COMPREHENSIVE METABOLIC PANEL
ALT: 15 U/L (ref 0–44)
AST: 21 U/L (ref 15–41)
Albumin: 2.8 g/dL — ABNORMAL LOW (ref 3.5–5.0)
Alkaline Phosphatase: 69 U/L (ref 38–126)
Anion gap: 7 (ref 5–15)
BUN: 23 mg/dL (ref 8–23)
CO2: 23 mmol/L (ref 22–32)
Calcium: 9.1 mg/dL (ref 8.9–10.3)
Chloride: 104 mmol/L (ref 98–111)
Creatinine, Ser: 0.69 mg/dL (ref 0.61–1.24)
GFR, Estimated: >60 mL/min (ref >60)
Glucose, Bld: 104 mg/dL — ABNORMAL HIGH (ref 70–99)
Potassium: 3.9 mmol/L (ref 3.5–5.1)
Sodium: 134 mmol/L — ABNORMAL LOW (ref 135–145)
Total Bilirubin: 0.8 mg/dL (ref 0.3–1.2)
Total Protein: 6.4 g/dL — ABNORMAL LOW (ref 6.5–8.1)

## 2021-06-05 LAB — URINALYSIS, ROUTINE W REFLEX MICROSCOPIC
Bacteria, UA: NONE SEEN
Bilirubin Urine: NEGATIVE
Glucose, UA: NEGATIVE mg/dL
Hgb urine dipstick: NEGATIVE
Ketones, ur: NEGATIVE mg/dL
Leukocytes,Ua: NEGATIVE
Nitrite: NEGATIVE
Protein, ur: NEGATIVE mg/dL
Specific Gravity, Urine: <1.005 — ABNORMAL LOW (ref 1.005–1.030)
pH: 7 (ref 5.0–8.0)

## 2021-06-05 LAB — TYPE AND SCREEN
ABO/RH(D): O POS
Antibody Screen: NEGATIVE

## 2021-06-05 LAB — RESP PANEL BY RT-PCR (FLU A&B, COVID) ARPGX2
Influenza A by PCR: NEGATIVE
Influenza B by PCR: NEGATIVE
SARS Coronavirus 2 by RT PCR: NEGATIVE

## 2021-06-05 MED ORDER — SODIUM CHLORIDE 0.9 % IV BOLUS
1000.0000 mL | Freq: Once | INTRAVENOUS | Status: AC
  Administered 2021-06-05: 1000 mL via INTRAVENOUS

## 2021-06-05 MED ORDER — SODIUM CHLORIDE 0.9 % IV BOLUS
500.0000 mL | Freq: Once | INTRAVENOUS | Status: AC
  Administered 2021-06-05: 500 mL via INTRAVENOUS

## 2021-06-05 NOTE — Discharge Instructions (Addendum)
Overall work-up today is unremarkable.  Blood counts are within normal limits.  No signs of infection.  Suspect symptoms are secondary to chemotherapy.

## 2021-06-05 NOTE — ED Triage Notes (Signed)
Pt BIB [facility] from home for generalized weakness all day. Pt too weak to come by car so called EMS. Oncology aware. Hx anemia. 20ga RH. 500NS. Placed on 3L [place] for tachypnea, more comfortable now.  BP 128/70 HR 84 RR 26 down to 20 on O2 SpO2 95% RA CBG 125

## 2021-06-05 NOTE — Telephone Encounter (Signed)
"  Very weak, lightheaded,it  is a struggle to get upstairs". His voice is weak. He states he has dry heaves , but  drinking fluids  Home BP today per pt "130/94, pulse 99".   Per Dr Leda Quail for pt to get 1 liter of IVF today.  Schedule message sent.

## 2021-06-05 NOTE — Telephone Encounter (Signed)
I called pt with appt at 1pm today for IVF.   He said he cannot get into the car due to weakness.   I told him to call EMS.

## 2021-06-05 NOTE — ED Provider Notes (Signed)
[facility] DEPT Provider Note   CSN: [id number] Arrival date & time: 06/05/21  1228     History Chief Complaint  Patient presents with   Weakness    Corey [name] is a 75 y.o. male.  Patient currently being treated with chemotherapy for lung cancer.  Had chemotherapy on Monday.  Has felt progressively weak.  To the point where even basic activity is becoming very difficult.  He states after his last session of chemotherapy he needed blood transfusions.  He denies any blood in his stool.  He does take Xarelto for atrial fibrillation.  Denies any chest pain.  Has had some shortness of breath.  The history is provided by the patient.  Weakness Severity:  Moderate Onset quality:  Gradual Duration:  2 days Timing:  Constant Progression:  Unchanged Chronicity:  New Relieved by:  Nothing Worsened by:  Nothing Associated symptoms: shortness of breath   Associated symptoms: no abdominal pain, no arthralgias, no chest pain, no cough, no dysuria, no fever, no seizures and no vomiting   Risk factors: anemia       Past Medical History:  Diagnosis Date   Anemia    Anxiety    Arthritis    "back, right knee, hands, ankles, neck" (05/31/2016)   Carotid artery disease ([place]) 08/30/2016   Carotid US 11/19: R 1-39, L 40-59 // Carotid US 08/2019: R 1-39; L 40-59 // Carotid US 11/21: R 1-39; L 40-59>> repeat 1 year    Chronic lower back pain    Coronary atherosclerosis of native coronary artery    a. BMS to [facility] 2004 and 2007, otherwise mild nonobstructive disease. EF normal.   Diverticulitis    Dyslipidemia    Dyspnea    Essential hypertension, benign    Family history of breast cancer    Family history of lung cancer    Family history of prostate cancer    GERD (gastroesophageal reflux disease)    Lumbar radiculopathy, chronic 02/04/2015   Right L5   Obesity    OSA on CPAP    uses cpap   Paroxysmal atrial fibrillation ([place])    a. Discovered after  stroke.   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   COPD with acute exacerbation ([place]) 04/02/2021   Cough with hemoptysis 04/01/2021   CAP (community acquired pneumonia) 04/01/2021   Family history of prostate cancer 03/20/2021   Family history of breast cancer 03/20/2021   Family history of lung cancer 03/20/2021   Goals of care, counseling/discussion 03/18/2021   Encounter for antineoplastic chemotherapy 03/18/2021   Encounter for antineoplastic immunotherapy 03/18/2021   Primary cancer of left lower lobe of lung ([place]) 03/07/2021   Non-small cell carcinoma of left lung, stage 4 ([place]) 03/05/2021   Malnutrition of moderate degree 02/25/2021   Pleural effusion on left 02/24/2021   Hilar mass    Reactive airway disease 11/20/2020   Allergic rhinitis with non-allergic component 09/18/2020   Heartburn 09/18/2020   Coughing 09/18/2020   Malignant neoplasm of prostate ([place]) 03/04/2020   Pre-syncope 02/27/2020   Cerebral embolism  with transient ischemic attack (TIA) 02/11/2020   History of stroke 63/84/5364   Embolic stroke involving cerebral artery ([place]) 02/11/2020   Secondary hypercoagulable state ([place]) 09/10/2019   Essential tremor 06/25/2019   Chest pain 05/27/2017   Demand ischemia ([place]) 05/27/2017   Morbid obesity ([place]) 05/27/2017   Carotid artery disease ([place]) 08/30/2016   Essential hypertension 08/30/2016   Diverticulitis of intestine without perforation or abscess without bleeding    Hyponatremia 06/04/2016   Diverticulitis 05/31/2016   Iron deficiency 01/05/2016   Lumbar radiculopathy, chronic  02/04/2015   SVT (supraventricular tachycardia) ([place]) 12/03/2014   PAC (premature atrial contraction) 04/05/2014   OSA on CPAP 03/27/2014   AKI (acute kidney injury) ([place]) 12/05/2013   Nausea 12/05/2013   Sepsis ([place]) 12/05/2013   Abdominal pain 12/05/2013   AF (paroxysmal atrial fibrillation) ([place]) 01/05/2013   CVA (cerebral infarction) 07/06/2012   Coronary artery disease    Benign essential HTN    Dyslipidemia    GERD (gastroesophageal reflux disease)     Past Surgical History:  Procedure Laterality Date   ADENOIDECTOMY     ANTERIOR CERVICAL DECOMP/DISCECTOMY FUSION  07/2001; 10/2002   "C5-6; C6-7; redo"   BACK SURGERY     CARPAL TUNNEL RELEASE Left 10/2015   CHEST TUBE INSERTION Left 03/17/2021   Procedure: INSERTION PLEURAL DRAINAGE CATHETER;  Surgeon: Garner Nash, DO;  Location: [facility];  Service: Pulmonary;  Laterality: Left;  Indwelling Tunneled Pleural catheter (PLEUREX)    COLONOSCOPY W/ POLYPECTOMY  02/2014   CORONARY ANGIOPLASTY WITH STENT PLACEMENT  05/2003; 12/2005   "mid RCA; mid RCA"   FINE NEEDLE ASPIRATION  02/26/2021   Procedure: FINE NEEDLE ASPIRATION (FNA) LINEAR;  Surgeon: Candee Furbish, MD;  Location: [facility] ENDOSCOPY;  Service: Pulmonary;;   HAND SURGERY  02/2019   LEFT HAND   implantable loop recorder placement  02/27/2020   [employer] [place] model [id number]  implantable loop recorder implanted by Dr Rayann Heman for afib management and evaluation of presyncope   IR IMAGING GUIDED PORT INSERTION  04/16/2021   JOINT REPLACEMENT     KNEE ARTHROSCOPY Left 10/2005   KNEE ARTHROSCOPY W/ PARTIAL MEDIAL MENISCECTOMY Left 09/2005   LUMBAR LAMINECTOMY/DECOMPRESSION MICRODISCECTOMY  03/2005   "L4-5"   POSTERIOR LUMBAR FUSION  10/2003   L5-S1; "plates, screws"   SHOULDER ARTHROSCOPY Right 08/2011   Debridement of labrum, arthroscopic distal clavicle excision   SHOULDER OPEN ROTATOR CUFF REPAIR Left 07/2014   TEE WITHOUT CARDIOVERSION  07/07/2012    Procedure: TRANSESOPHAGEAL ECHOCARDIOGRAM (TEE);  Surgeon: Fay Records, MD;  Location[facility] ENDOSCOPY;  Service: Cardiovascular;  Laterality: N/A;   THORACENTESIS N/A 02/25/2021   Procedure: Mathews Robinsons;  Surgeon: Juanito Doom, MD;  Location: [facility];  Service: Cardiopulmonary;  Laterality: N/A;   TONSILLECTOMY AND ADENOIDECTOMY  ~ 1956   TOTAL KNEE ARTHROPLASTY Left 10/2006   TRIGGER FINGER RELEASE Left 10/2015   VIDEO BRONCHOSCOPY WITH ENDOBRONCHIAL ULTRASOUND Left 02/26/2021   Procedure: VIDEO BRONCHOSCOPY WITH ENDOBRONCHIAL ULTRASOUND;  Surgeon: Candee Furbish, MD;  Location: [facility] ENDOSCOPY;  Service: Pulmonary;  Laterality: Left;  cryoprobe too thanks!       Family History  Problem Relation Age of Onset   Hypertension Mother    Heart disease Father    Heart attack Father    Prostate cancer Brother 107   Parkinson's disease Brother    Lung cancer Paternal Aunt        hx of smoking   Breast cancer Paternal  Grandmother        dx 96s, bilateral mastectomies   Heart attack Paternal Grandfather    Blindness Son    Colon cancer Neg Hx    Pancreatic cancer Neg Hx     Social History   Tobacco Use   Smoking status: Former    Packs/day: 1.00    Years: 34.00    Pack years: 34.00    Types: Cigarettes    Quit date: 05/05/2003    Years since quitting: 18.0   Smokeless tobacco: Never  Vaping Use   Vaping Use: Never used  Substance Use Topics   Alcohol use: No    Alcohol/week: 0.0 standard drinks   Drug use: No    Home Medications Prior to Admission medications   Medication Sig Start Date End Date Taking? Authorizing Provider  acetaminophen (TYLENOL 8 HOUR ARTHRITIS PAIN) 650 MG CR tablet Take 1,300 mg by mouth every 8 (eight) hours as needed for pain (pain).    [provider]  albuterol (VENTOLIN HFA) 108 (90 Base) MCG/ACT inhaler Inhale 2 puffs into the lungs every 4 (four) hours as needed for wheezing or shortness of breath. Inhale 2 puffs into the lungs  every 4 hours as needed 04/04/21   Rai, Vernelle Emerald, MD  ALPRAZolam Duanne Moron) 0.5 MG tablet Take 1 tablet (0.5 mg total) by mouth at bedtime as needed for anxiety. 05/20/21   Darliss Cheney, MD  atorvastatin (LIPITOR) 40 MG tablet Take 1 tablet (40 mg total) by mouth daily. 01/21/21   Nahser, Wonda Cheng, MD  benzonatate (TESSALON) 200 MG capsule Take 1 capsule (200 mg total) by mouth 3 (three) times daily as needed for cough. 04/21/21   Curt Bears, MD  Carbinoxamine Maleate 6 MG TABS Take 1 tablet by mouth 2 (two) times daily as needed (drainage). 03/24/21   Garnet Sierras, DO  chlorpheniramine-HYDROcodone (TUSSIONEX) 10-8 MG/5ML SUER Take 5 mLs by mouth every 12 (twelve) hours as needed for cough. 04/17/21   Garner Nash, DO  diclofenac Sodium (VOLTAREN) 1 % GEL Apply 4 g topically 4 (four) times daily. 02/27/21   Mikhail, Velta Addison, DO  dicyclomine (BENTYL) 20 MG tablet Take 20 mg by mouth daily as needed for spasms.    [provider]  diltiazem (CARDIZEM) 30 MG tablet Take 1 tablet (30 mg total) by mouth 4 (four) times daily as needed. 01/21/21   Nahser, Wonda Cheng, MD  dofetilide (TIKOSYN) 500 MCG capsule Take 1 capsule (500 mcg total) by mouth 2 (two) times daily. Please keep upcoming appointment in February for future refills. Thank you 01/21/21   Nahser, Wonda Cheng, MD  fenofibrate 160 MG tablet TAKE 1 TABLET DAILY. PLEASE KEEP UPCOMING APPT IN MARCH WITH DR. Acie Fredrickson BEFORE ANYMORE REFILLS. Patient taking differently: Take 160 mg by mouth daily. 01/21/21   Nahser, Wonda Cheng, MD  fluticasone (FLONASE) 50 MCG/ACT nasal spray Place 2 sprays into both nostrils daily. 09/18/20   Garnet Sierras, DO  folic acid (FOLVITE) 1 MG tablet Take 1 tablet (1 mg total) by mouth daily. 03/18/21   Heilingoetter, Cassandra L, PA-C  ipratropium (ATROVENT) 0.03 % nasal spray Place 1-2 sprays into both nostrils every 12 (twelve) hours. For drainage 03/24/21   Garnet Sierras, DO  leuprolide, 6 Month, (ELIGARD) 45 MG injection Inject  45 mg into the skin every 6 (six) months.    [provider]  lidocaine-prilocaine (EMLA) cream Apply 1 application topically as needed. 04/07/21   Heilingoetter, Cassandra L, PA-C  loperamide (IMODIUM) 2 MG capsule Take 2 mg by mouth daily as needed for diarrhea or loose stools.    [provider]  magnesium oxide (MAG-OX) 400 (240 Mg) MG tablet TAKE 1 TABLET BY MOUTH EVERY DAY 06/03/21   Fenton, Clint R, PA  metoprolol succinate (TOPROL XL) 25 MG 24 hr tablet Take 1 tablet (25 mg total) by mouth daily. 08/04/20   Thompson Grayer, MD  Multiple Vitamins-Minerals (MULTIVITAMINS THER. W/MINERALS) TABS Take 1 tablet by mouth daily.    [provider]  nitroGLYCERIN (NITROSTAT) 0.4 MG SL tablet Place 1 tablet (0.4 mg total) under the tongue every 5 (five) minutes as needed for chest pain. 01/21/21   Nahser, Wonda Cheng, MD  omeprazole (PRILOSEC) 20 MG capsule Take 1 capsule (20 mg total) by mouth daily. 03/24/21   Garnet Sierras, DO  ondansetron (ZOFRAN) 8 MG tablet Take 1 tablet (8 mg total) by mouth every 8 (eight) hours as needed for nausea or vomiting. Starting 3 days after chemotherapy 05/11/21   Curt Bears, MD  potassium chloride (KLOR-CON) 10 MEQ tablet Take 1 tablet (10 mEq total) by mouth daily. 10/23/20   Nahser, Wonda Cheng, MD  promethazine (PHENERGAN) 25 MG tablet Take 1 tablet (25 mg total) by mouth every 6 (six) hours as needed for nausea or vomiting. 03/25/21   Curt Bears, MD  Saccharomyces boulardii (PROBIOTIC) 250 MG CAPS Take 1 capsule by mouth daily.    [provider]  sucralfate (CARAFATE) 1 GM/10ML suspension Take 10 mLs (1 g total) by mouth 4 (four) times daily. Before meals 03/25/21   Curt Bears, MD  tamsulosin (FLOMAX) 0.4 MG CAPS capsule Take 0.4 mg by mouth daily after supper.    [provider]  Tiotropium Bromide-Olodaterol (STIOLTO RESPIMAT) 2.5-2.5 MCG/ACT AERS Inhale 2 puffs into the lungs daily. 04/04/21   Rai, Vernelle Emerald, MD   topiramate (TOPAMAX) 50 MG tablet Take 2 tablets (100 mg total) by mouth 2 (two) times daily. 04/13/21   Frann Rider, NP  traMADol (ULTRAM) 50 MG tablet Take 1 tablet (50 mg total) by mouth every 6 (six) hours as needed for moderate pain. 05/20/21   Darliss Cheney, MD  traZODone (DESYREL) 50 MG tablet Take 0.5 tablets (25 mg total) by mouth at bedtime as needed for sleep. 09/22/20   Frann Rider, NP  Vitamin D, Ergocalciferol, (DRISDOL) 1.25 MG (50000 UNIT) CAPS capsule TAKE 1 CAPSULE BY MOUTH ONE TIME PER WEEK Patient taking differently: Take 50,000 Units by mouth every 7 (seven) days. Monday 03/19/21   Brunetta Genera, MD  XARELTO 20 MG TABS tablet TAKE 1 TABLET BY MOUTH EVERY DAY Patient taking differently: 20 mg daily with supper. 10/06/20   Nahser, Wonda Cheng, MD    Allergies    Amoxicillin, Fosinopril, Hydromorphone, Ampicillin, Monopril [fosinopril sodium], Other, and Rosuvastatin  Review of Systems   Review of Systems  Constitutional:  Negative for chills and fever.  HENT:  Negative for ear pain and sore throat.   Eyes:  Negative for pain and visual disturbance.  Respiratory:  Positive for shortness of breath. Negative for cough.   Cardiovascular:  Negative for chest pain and palpitations.  Gastrointestinal:  Negative for abdominal pain and vomiting.  Genitourinary:  Negative for dysuria and hematuria.  Musculoskeletal:  Negative for arthralgias and back pain.  Skin:  Negative for color change and rash.  Neurological:  Positive for weakness. Negative for seizures and syncope.  All other systems reviewed and are negative.  Physical  Exam Updated Vital Signs BP 127/72   Pulse 86   Temp 98.3 F (36.8 C) (Oral)   Resp 18   Ht 6' 2" (1.88 m)   Wt 108 kg   SpO2 100%   BMI 30.57 kg/m   Physical Exam Vitals and nursing note reviewed.  Constitutional:      General: He is not in acute distress.    Appearance: He is well-developed. He is not ill-appearing.  HENT:      Head: Normocephalic and atraumatic.     Nose: Nose normal.  Eyes:     Pupils: Pupils are equal, round, and reactive to light.  Cardiovascular:     Rate and Rhythm: Normal rate and regular rhythm.     Pulses: Normal pulses.     Heart sounds: Normal heart sounds. No murmur heard. Pulmonary:     Effort: Pulmonary effort is normal. No respiratory distress.     Breath sounds: Normal breath sounds.  Abdominal:     General: Abdomen is flat.     Palpations: Abdomen is soft.     Tenderness: There is no abdominal tenderness.  Musculoskeletal:     Cervical back: Normal range of motion and neck supple.  Skin:    General: Skin is warm and dry.     Coloration: Skin is pale.  Neurological:     General: No focal deficit present.     Mental Status: He is alert and oriented to person, place, and time.     Cranial Nerves: No cranial nerve deficit.     Sensory: No sensory deficit.     Motor: No weakness.     Coordination: Coordination normal.  Psychiatric:        Mood and Affect: Mood normal.    ED Results / Procedures / Treatments   Labs (all labs ordered are listed, but only abnormal results are displayed) Labs Reviewed  CBC WITH DIFFERENTIAL/PLATELET - Abnormal; Notable for the following components:      Result Value   WBC 22.9 (*)    RBC 3.38 (*)    Hemoglobin 10.1 (*)    HCT 30.9 (*)    RDW 20.6 (*)    Neutro Abs 22.4 (*)    Lymphs Abs 0.0 (*)    All other components within normal limits  COMPREHENSIVE METABOLIC PANEL - Abnormal; Notable for the following components:   Sodium 134 (*)    Glucose, Bld 104 (*)    Total Protein 6.4 (*)    Albumin 2.8 (*)    All other components within normal limits  URINALYSIS, ROUTINE W REFLEX MICROSCOPIC - Abnormal; Notable for the following components:   APPearance CLEAR (*)    Specific Gravity, Urine <1.005 (*)    All other components within normal limits  RESP PANEL BY RT-PCR (FLU A&B, COVID) [id number]  TYPE AND SCREEN    EKG EKG  Interpretation  Date/Time:  Friday June 05 2021 12:55:48 EDT Ventricular Rate:  85 PR Interval:  179 QRS Duration: 101 QT Interval:  475 QTC Calculation: 565 R Axis:   30 Text Interpretation: Sinus rhythm Atrial premature complexes Abnormal R-wave progression, late transition Confirmed by Lennice Sites ([id number]) on 06/05/2021 12:59:04 PM  Radiology DG Chest Portable 1 View  Result Date: 06/05/2021 CLINICAL DATA:  Weakness, shortness of breath EXAM: PORTABLE CHEST 1 VIEW COMPARISON:  Chest radiographs, 05/20/2021, CT chest, 06/02/2021 FINDINGS: Cardiomegaly with implantable loop recorder. Unchanged post treatment appearance of the left chest. No new or acute appearing airspace opacity.  The visualized skeletal structures are unremarkable. IMPRESSION: Cardiomegaly with unchanged post treatment appearance of the left chest. No new or acute appearing airspace opacity. Electronically Signed   By: Eddie Candle M.D.   On: 06/05/2021 13:32    Procedures Procedures   Medications Ordered in ED Medications  sodium chloride 0.9 % bolus 1,000 mL (1,000 mLs Intravenous New Bag/Given 06/05/21 1326)  sodium chloride 0.9 % bolus 500 mL (500 mLs Intravenous New Bag/Given 06/05/21 1427)    ED Course  I have reviewed the triage vital signs and the nursing notes.  Pertinent labs & imaging results that were available during my care of the patient were reviewed by me and considered in my medical decision making (see chart for details).    MDM Rules/Calculators/A&P                           Corey [name] is a 75 year old male with history of lung cancer currently on chemotherapy, atrial fibrillation on Xarelto who presents the ED with generalized weakness.  Normal vitals.  No fever.  Has felt progressive weakness since chemotherapy on Monday.  States that after his last chemotherapy he needed blood transfusions.  Denies any blood in his stool.  Had a normal bowel movement yesterday.  Has generalized weakness but  no focal neurological findings on exam.  Does appear pale.  Does not have a fever.  Will evaluate with lab work to evaluate for anemia.  Will check chest x-ray and urinalysis.  Will give fluid bolus.  Suspect symptoms are secondary to chemotherapy.  Lab work overall unremarkable.  Leukocytosis likely secondary to injection he got with his chemotherapy per oncology on-call.  No significant anemia.  No UTI, no chest infection.  Normal vitals.  Felt better after IV fluids.  Overall suspect symptoms secondary to recent chemotherapy.  Discharged in good condition.  This chart was dictated using voice recognition software.  Despite best efforts to proofread,  errors can occur which can change the documentation meaning.   Final Clinical Impression(s) / ED Diagnoses Final diagnoses:  Generalized weakness    Rx / DC Orders ED Discharge Orders     None        Lennice Sites, DO 06/05/21 1428

## 2021-06-09 ENCOUNTER — Other Ambulatory Visit: Payer: Self-pay | Admitting: Physician Assistant

## 2021-06-09 ENCOUNTER — Other Ambulatory Visit: Payer: Self-pay

## 2021-06-09 ENCOUNTER — Inpatient Hospital Stay: Payer: Medicare PPO | Attending: Physician Assistant

## 2021-06-09 DIAGNOSIS — C3412 Malignant neoplasm of upper lobe, left bronchus or lung: Secondary | ICD-10-CM | POA: Diagnosis not present

## 2021-06-09 DIAGNOSIS — C778 Secondary and unspecified malignant neoplasm of lymph nodes of multiple regions: Secondary | ICD-10-CM | POA: Insufficient documentation

## 2021-06-09 DIAGNOSIS — Z5111 Encounter for antineoplastic chemotherapy: Secondary | ICD-10-CM | POA: Insufficient documentation

## 2021-06-09 DIAGNOSIS — J910 Malignant pleural effusion: Secondary | ICD-10-CM | POA: Diagnosis not present

## 2021-06-09 DIAGNOSIS — D649 Anemia, unspecified: Secondary | ICD-10-CM

## 2021-06-09 DIAGNOSIS — Z5112 Encounter for antineoplastic immunotherapy: Secondary | ICD-10-CM | POA: Insufficient documentation

## 2021-06-09 DIAGNOSIS — C787 Secondary malignant neoplasm of liver and intrahepatic bile duct: Secondary | ICD-10-CM | POA: Diagnosis not present

## 2021-06-09 DIAGNOSIS — C3492 Malignant neoplasm of unspecified part of left bronchus or lung: Secondary | ICD-10-CM

## 2021-06-09 DIAGNOSIS — Z79899 Other long term (current) drug therapy: Secondary | ICD-10-CM | POA: Insufficient documentation

## 2021-06-09 DIAGNOSIS — D6481 Anemia due to antineoplastic chemotherapy: Secondary | ICD-10-CM | POA: Diagnosis not present

## 2021-06-09 DIAGNOSIS — Z95828 Presence of other vascular implants and grafts: Secondary | ICD-10-CM

## 2021-06-09 LAB — CBC WITH DIFFERENTIAL (CANCER CENTER ONLY)
Abs Immature Granulocytes: 0.24 10*3/uL — ABNORMAL HIGH (ref 0.00–0.07)
Basophils Absolute: 0.1 10*3/uL (ref 0.0–0.1)
Basophils Relative: 1 %
Eosinophils Absolute: 0.1 10*3/uL (ref 0.0–0.5)
Eosinophils Relative: 1 %
HCT: 25.2 % — ABNORMAL LOW (ref 39.0–52.0)
Hemoglobin: 8.6 g/dL — ABNORMAL LOW (ref 13.0–17.0)
Immature Granulocytes: 3 %
Lymphocytes Relative: 2 %
Lymphs Abs: 0.2 10*3/uL — ABNORMAL LOW (ref 0.7–4.0)
MCH: 30 pg (ref 26.0–34.0)
MCHC: 34.1 g/dL (ref 30.0–36.0)
MCV: 87.8 fL (ref 80.0–100.0)
Monocytes Absolute: 0.9 10*3/uL (ref 0.1–1.0)
Monocytes Relative: 10 %
Neutro Abs: 7.9 10*3/uL — ABNORMAL HIGH (ref 1.7–7.7)
Neutrophils Relative %: 83 %
Platelet Count: 60 10*3/uL — ABNORMAL LOW (ref 150–400)
RBC: 2.87 MIL/uL — ABNORMAL LOW (ref 4.22–5.81)
RDW: 19.7 % — ABNORMAL HIGH (ref 11.5–15.5)
WBC Count: 9.4 10*3/uL (ref 4.0–10.5)
nRBC: 0 % (ref 0.0–0.2)

## 2021-06-09 LAB — CMP (CANCER CENTER ONLY)
ALT: 16 U/L (ref 0–44)
AST: 22 U/L (ref 15–41)
Albumin: 2.7 g/dL — ABNORMAL LOW (ref 3.5–5.0)
Alkaline Phosphatase: 96 U/L (ref 38–126)
Anion gap: 8 (ref 5–15)
BUN: 17 mg/dL (ref 8–23)
CO2: 22 mmol/L (ref 22–32)
Calcium: 8.8 mg/dL — ABNORMAL LOW (ref 8.9–10.3)
Chloride: 101 mmol/L (ref 98–111)
Creatinine: 0.76 mg/dL (ref 0.61–1.24)
GFR, Estimated: >60 mL/min (ref >60)
Glucose, Bld: 109 mg/dL — ABNORMAL HIGH (ref 70–99)
Potassium: 4.3 mmol/L (ref 3.5–5.1)
Sodium: 131 mmol/L — ABNORMAL LOW (ref 135–145)
Total Bilirubin: 0.6 mg/dL (ref 0.3–1.2)
Total Protein: 6.1 g/dL — ABNORMAL LOW (ref 6.5–8.1)

## 2021-06-09 LAB — CUP PACEART REMOTE DEVICE CHECK
Date Time Interrogation Session: 20220831101629
Implantable Pulse Generator Implant Date: 20210526

## 2021-06-09 MED ORDER — SODIUM CHLORIDE 0.9% FLUSH
10.0000 mL | Freq: Once | INTRAVENOUS | Status: AC
  Administered 2021-06-09: 10 mL

## 2021-06-09 MED ORDER — HEPARIN SOD (PORK) LOCK FLUSH 100 UNIT/ML IV SOLN
500.0000 [IU] | Freq: Once | INTRAVENOUS | Status: AC
  Administered 2021-06-09: 500 [IU]

## 2021-06-10 DIAGNOSIS — C61 Malignant neoplasm of prostate: Secondary | ICD-10-CM | POA: Diagnosis not present

## 2021-06-10 DIAGNOSIS — N401 Enlarged prostate with lower urinary tract symptoms: Secondary | ICD-10-CM | POA: Diagnosis not present

## 2021-06-10 DIAGNOSIS — R351 Nocturia: Secondary | ICD-10-CM | POA: Diagnosis not present

## 2021-06-11 DIAGNOSIS — D6481 Anemia due to antineoplastic chemotherapy: Secondary | ICD-10-CM | POA: Diagnosis not present

## 2021-06-11 DIAGNOSIS — C3432 Malignant neoplasm of lower lobe, left bronchus or lung: Secondary | ICD-10-CM | POA: Diagnosis not present

## 2021-06-11 DIAGNOSIS — I10 Essential (primary) hypertension: Secondary | ICD-10-CM | POA: Diagnosis not present

## 2021-06-11 DIAGNOSIS — D630 Anemia in neoplastic disease: Secondary | ICD-10-CM | POA: Diagnosis not present

## 2021-06-11 DIAGNOSIS — T451X5D Adverse effect of antineoplastic and immunosuppressive drugs, subsequent encounter: Secondary | ICD-10-CM | POA: Diagnosis not present

## 2021-06-11 DIAGNOSIS — J449 Chronic obstructive pulmonary disease, unspecified: Secondary | ICD-10-CM | POA: Diagnosis not present

## 2021-06-11 DIAGNOSIS — I480 Paroxysmal atrial fibrillation: Secondary | ICD-10-CM | POA: Diagnosis not present

## 2021-06-11 DIAGNOSIS — C61 Malignant neoplasm of prostate: Secondary | ICD-10-CM | POA: Diagnosis not present

## 2021-06-11 DIAGNOSIS — I2510 Atherosclerotic heart disease of native coronary artery without angina pectoris: Secondary | ICD-10-CM | POA: Diagnosis not present

## 2021-06-14 ENCOUNTER — Other Ambulatory Visit: Payer: Self-pay | Admitting: Physician Assistant

## 2021-06-14 DIAGNOSIS — C3492 Malignant neoplasm of unspecified part of left bronchus or lung: Secondary | ICD-10-CM

## 2021-06-15 ENCOUNTER — Telehealth: Payer: Self-pay

## 2021-06-15 ENCOUNTER — Other Ambulatory Visit: Payer: Self-pay | Admitting: Physician Assistant

## 2021-06-15 ENCOUNTER — Inpatient Hospital Stay: Payer: Medicare PPO

## 2021-06-15 ENCOUNTER — Other Ambulatory Visit: Payer: Self-pay

## 2021-06-15 DIAGNOSIS — C778 Secondary and unspecified malignant neoplasm of lymph nodes of multiple regions: Secondary | ICD-10-CM | POA: Diagnosis not present

## 2021-06-15 DIAGNOSIS — T451X5D Adverse effect of antineoplastic and immunosuppressive drugs, subsequent encounter: Secondary | ICD-10-CM | POA: Diagnosis not present

## 2021-06-15 DIAGNOSIS — C3492 Malignant neoplasm of unspecified part of left bronchus or lung: Secondary | ICD-10-CM

## 2021-06-15 DIAGNOSIS — C3432 Malignant neoplasm of lower lobe, left bronchus or lung: Secondary | ICD-10-CM | POA: Diagnosis not present

## 2021-06-15 DIAGNOSIS — I10 Essential (primary) hypertension: Secondary | ICD-10-CM | POA: Diagnosis not present

## 2021-06-15 DIAGNOSIS — C3412 Malignant neoplasm of upper lobe, left bronchus or lung: Secondary | ICD-10-CM | POA: Diagnosis not present

## 2021-06-15 DIAGNOSIS — D649 Anemia, unspecified: Secondary | ICD-10-CM

## 2021-06-15 DIAGNOSIS — J910 Malignant pleural effusion: Secondary | ICD-10-CM | POA: Diagnosis not present

## 2021-06-15 DIAGNOSIS — Z95828 Presence of other vascular implants and grafts: Secondary | ICD-10-CM

## 2021-06-15 DIAGNOSIS — I480 Paroxysmal atrial fibrillation: Secondary | ICD-10-CM | POA: Diagnosis not present

## 2021-06-15 DIAGNOSIS — D6481 Anemia due to antineoplastic chemotherapy: Secondary | ICD-10-CM | POA: Diagnosis not present

## 2021-06-15 DIAGNOSIS — Z5111 Encounter for antineoplastic chemotherapy: Secondary | ICD-10-CM | POA: Diagnosis not present

## 2021-06-15 DIAGNOSIS — Z5112 Encounter for antineoplastic immunotherapy: Secondary | ICD-10-CM | POA: Diagnosis not present

## 2021-06-15 DIAGNOSIS — I2510 Atherosclerotic heart disease of native coronary artery without angina pectoris: Secondary | ICD-10-CM | POA: Diagnosis not present

## 2021-06-15 DIAGNOSIS — Z79899 Other long term (current) drug therapy: Secondary | ICD-10-CM | POA: Diagnosis not present

## 2021-06-15 DIAGNOSIS — D630 Anemia in neoplastic disease: Secondary | ICD-10-CM | POA: Diagnosis not present

## 2021-06-15 DIAGNOSIS — C61 Malignant neoplasm of prostate: Secondary | ICD-10-CM | POA: Diagnosis not present

## 2021-06-15 DIAGNOSIS — C787 Secondary malignant neoplasm of liver and intrahepatic bile duct: Secondary | ICD-10-CM | POA: Diagnosis not present

## 2021-06-15 DIAGNOSIS — J449 Chronic obstructive pulmonary disease, unspecified: Secondary | ICD-10-CM | POA: Diagnosis not present

## 2021-06-15 LAB — SAMPLE TO BLOOD BANK

## 2021-06-15 LAB — CMP (CANCER CENTER ONLY)
ALT: 13 U/L (ref 0–44)
AST: 16 U/L (ref 15–41)
Albumin: 2.8 g/dL — ABNORMAL LOW (ref 3.5–5.0)
Alkaline Phosphatase: 88 U/L (ref 38–126)
Anion gap: 7 (ref 5–15)
BUN: 12 mg/dL (ref 8–23)
CO2: 23 mmol/L (ref 22–32)
Calcium: 8.9 mg/dL (ref 8.9–10.3)
Chloride: 103 mmol/L (ref 98–111)
Creatinine: 0.8 mg/dL (ref 0.61–1.24)
GFR, Estimated: >60 mL/min (ref >60)
Glucose, Bld: 107 mg/dL — ABNORMAL HIGH (ref 70–99)
Potassium: 3.9 mmol/L (ref 3.5–5.1)
Sodium: 133 mmol/L — ABNORMAL LOW (ref 135–145)
Total Bilirubin: 0.5 mg/dL (ref 0.3–1.2)
Total Protein: 6.3 g/dL — ABNORMAL LOW (ref 6.5–8.1)

## 2021-06-15 LAB — CBC WITH DIFFERENTIAL (CANCER CENTER ONLY)
Abs Immature Granulocytes: 0.46 10*3/uL — ABNORMAL HIGH (ref 0.00–0.07)
Basophils Absolute: 0 10*3/uL (ref 0.0–0.1)
Basophils Relative: 0 %
Eosinophils Absolute: 0.1 10*3/uL (ref 0.0–0.5)
Eosinophils Relative: 2 %
HCT: 23.1 % — ABNORMAL LOW (ref 39.0–52.0)
Hemoglobin: 7.5 g/dL — ABNORMAL LOW (ref 13.0–17.0)
Immature Granulocytes: 6 %
Lymphocytes Relative: 4 %
Lymphs Abs: 0.4 10*3/uL — ABNORMAL LOW (ref 0.7–4.0)
MCH: 29.5 pg (ref 26.0–34.0)
MCHC: 32.5 g/dL (ref 30.0–36.0)
MCV: 90.9 fL (ref 80.0–100.0)
Monocytes Absolute: 0.9 10*3/uL (ref 0.1–1.0)
Monocytes Relative: 11 %
Neutro Abs: 6.5 10*3/uL (ref 1.7–7.7)
Neutrophils Relative %: 77 %
Platelet Count: 52 10*3/uL — ABNORMAL LOW (ref 150–400)
RBC: 2.54 MIL/uL — ABNORMAL LOW (ref 4.22–5.81)
RDW: 19.9 % — ABNORMAL HIGH (ref 11.5–15.5)
WBC Count: 8.3 10*3/uL (ref 4.0–10.5)
nRBC: 0.4 % — ABNORMAL HIGH (ref 0.0–0.2)

## 2021-06-15 LAB — PREPARE RBC (CROSSMATCH)

## 2021-06-15 MED ORDER — SODIUM CHLORIDE 0.9% FLUSH
10.0000 mL | Freq: Once | INTRAVENOUS | Status: AC
  Administered 2021-06-15: 10 mL

## 2021-06-15 MED ORDER — HEPARIN SOD (PORK) LOCK FLUSH 100 UNIT/ML IV SOLN
500.0000 [IU] | Freq: Once | INTRAVENOUS | Status: AC
  Administered 2021-06-15: 500 [IU]

## 2021-06-15 NOTE — Telephone Encounter (Signed)
We have arranged for pt to received 2 units of PRBCs at our [place] location on Wednesday 06/17/21 with an arrival time of 8:15am. I have spoke with pt to advise of this and he expressed understanding of this information.

## 2021-06-17 ENCOUNTER — Other Ambulatory Visit: Payer: Self-pay

## 2021-06-17 ENCOUNTER — Inpatient Hospital Stay: Payer: Medicare PPO

## 2021-06-17 DIAGNOSIS — C3412 Malignant neoplasm of upper lobe, left bronchus or lung: Secondary | ICD-10-CM | POA: Diagnosis not present

## 2021-06-17 DIAGNOSIS — Z5112 Encounter for antineoplastic immunotherapy: Secondary | ICD-10-CM | POA: Diagnosis not present

## 2021-06-17 DIAGNOSIS — Z5111 Encounter for antineoplastic chemotherapy: Secondary | ICD-10-CM | POA: Diagnosis not present

## 2021-06-17 DIAGNOSIS — D6481 Anemia due to antineoplastic chemotherapy: Secondary | ICD-10-CM | POA: Diagnosis not present

## 2021-06-17 DIAGNOSIS — D649 Anemia, unspecified: Secondary | ICD-10-CM

## 2021-06-17 DIAGNOSIS — C778 Secondary and unspecified malignant neoplasm of lymph nodes of multiple regions: Secondary | ICD-10-CM | POA: Diagnosis not present

## 2021-06-17 DIAGNOSIS — Z79899 Other long term (current) drug therapy: Secondary | ICD-10-CM | POA: Diagnosis not present

## 2021-06-17 DIAGNOSIS — G4733 Obstructive sleep apnea (adult) (pediatric): Secondary | ICD-10-CM | POA: Diagnosis not present

## 2021-06-17 DIAGNOSIS — J910 Malignant pleural effusion: Secondary | ICD-10-CM | POA: Diagnosis not present

## 2021-06-17 DIAGNOSIS — C787 Secondary malignant neoplasm of liver and intrahepatic bile duct: Secondary | ICD-10-CM | POA: Diagnosis not present

## 2021-06-17 DIAGNOSIS — J441 Chronic obstructive pulmonary disease with (acute) exacerbation: Secondary | ICD-10-CM | POA: Diagnosis not present

## 2021-06-17 MED ORDER — ACETAMINOPHEN 325 MG PO TABS
650.0000 mg | ORAL_TABLET | Freq: Once | ORAL | Status: AC
  Administered 2021-06-17: 650 mg via ORAL
  Filled 2021-06-17: qty 2

## 2021-06-17 MED ORDER — HEPARIN SOD (PORK) LOCK FLUSH 100 UNIT/ML IV SOLN
500.0000 [IU] | Freq: Every day | INTRAVENOUS | Status: AC | PRN
  Administered 2021-06-17: 500 [IU]

## 2021-06-17 MED ORDER — SODIUM CHLORIDE 0.9% IV SOLUTION
250.0000 mL | Freq: Once | INTRAVENOUS | Status: AC
  Administered 2021-06-17: 250 mL via INTRAVENOUS

## 2021-06-17 MED ORDER — DIPHENHYDRAMINE HCL 25 MG PO CAPS
25.0000 mg | ORAL_CAPSULE | Freq: Once | ORAL | Status: AC
  Administered 2021-06-17: 25 mg via ORAL
  Filled 2021-06-17: qty 1

## 2021-06-17 MED ORDER — SODIUM CHLORIDE 0.9% FLUSH
10.0000 mL | INTRAVENOUS | Status: AC | PRN
  Administered 2021-06-17: 10 mL

## 2021-06-17 NOTE — Progress Notes (Signed)
Carelink Summary Report / Loop Recorder 

## 2021-06-17 NOTE — Patient Instructions (Signed)

## 2021-06-18 DIAGNOSIS — I480 Paroxysmal atrial fibrillation: Secondary | ICD-10-CM | POA: Diagnosis not present

## 2021-06-18 DIAGNOSIS — C3432 Malignant neoplasm of lower lobe, left bronchus or lung: Secondary | ICD-10-CM | POA: Diagnosis not present

## 2021-06-18 DIAGNOSIS — C61 Malignant neoplasm of prostate: Secondary | ICD-10-CM | POA: Diagnosis not present

## 2021-06-18 DIAGNOSIS — J449 Chronic obstructive pulmonary disease, unspecified: Secondary | ICD-10-CM | POA: Diagnosis not present

## 2021-06-18 DIAGNOSIS — T451X5D Adverse effect of antineoplastic and immunosuppressive drugs, subsequent encounter: Secondary | ICD-10-CM | POA: Diagnosis not present

## 2021-06-18 DIAGNOSIS — D6481 Anemia due to antineoplastic chemotherapy: Secondary | ICD-10-CM | POA: Diagnosis not present

## 2021-06-18 DIAGNOSIS — I10 Essential (primary) hypertension: Secondary | ICD-10-CM | POA: Diagnosis not present

## 2021-06-18 DIAGNOSIS — I2510 Atherosclerotic heart disease of native coronary artery without angina pectoris: Secondary | ICD-10-CM | POA: Diagnosis not present

## 2021-06-18 DIAGNOSIS — D630 Anemia in neoplastic disease: Secondary | ICD-10-CM | POA: Diagnosis not present

## 2021-06-18 LAB — BPAM RBC
Blood Product Expiration Date: 202210052359
Blood Product Expiration Date: 202210132359
ISSUE DATE / TIME: 202209140720
ISSUE DATE / TIME: 202209140720
Unit Type and Rh: 5100
Unit Type and Rh: 5100

## 2021-06-18 LAB — TYPE AND SCREEN
ABO/RH(D): O POS
Antibody Screen: NEGATIVE
Unit division: 0
Unit division: 0

## 2021-06-19 DIAGNOSIS — T451X5D Adverse effect of antineoplastic and immunosuppressive drugs, subsequent encounter: Secondary | ICD-10-CM | POA: Diagnosis not present

## 2021-06-19 DIAGNOSIS — J449 Chronic obstructive pulmonary disease, unspecified: Secondary | ICD-10-CM | POA: Diagnosis not present

## 2021-06-19 DIAGNOSIS — I10 Essential (primary) hypertension: Secondary | ICD-10-CM | POA: Diagnosis not present

## 2021-06-19 DIAGNOSIS — I2510 Atherosclerotic heart disease of native coronary artery without angina pectoris: Secondary | ICD-10-CM | POA: Diagnosis not present

## 2021-06-19 DIAGNOSIS — I480 Paroxysmal atrial fibrillation: Secondary | ICD-10-CM | POA: Diagnosis not present

## 2021-06-19 DIAGNOSIS — D6481 Anemia due to antineoplastic chemotherapy: Secondary | ICD-10-CM | POA: Diagnosis not present

## 2021-06-19 DIAGNOSIS — C3432 Malignant neoplasm of lower lobe, left bronchus or lung: Secondary | ICD-10-CM | POA: Diagnosis not present

## 2021-06-19 DIAGNOSIS — D630 Anemia in neoplastic disease: Secondary | ICD-10-CM | POA: Diagnosis not present

## 2021-06-19 DIAGNOSIS — C61 Malignant neoplasm of prostate: Secondary | ICD-10-CM | POA: Diagnosis not present

## 2021-06-19 NOTE — Progress Notes (Signed)
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     Molecular Studies by [employer]: [id number] 0.5%   Olaparib Yes [id number] 50.9%   Olaparib Yes [id number] 0.8% None      PDL1: 30%  PRIOR THERAPY:  1) Palliative radiotherapy to the obstructive lung mass in the left lung under the care of Dr. Tammi Klippel. Last treatment 03/23/21  CURRENT THERAPY: Palliative systemic chemotherapy with carboplatin AUC 5, Alimta 500 mg per metered squared, Keytruda 200 mg IV every 3 weeks.  First dose expected on 03/31/21. Status post 4 cycles. His dose of carboplatin was reduced to AUC of 4 and his Alimta was reduced to 400 mg/m2. Starting from cycle #5 the patient will be on maintenance Alimta and Keytruda.    INTERVAL HISTORY: Corey [name] 75 y.o. male returns to the clinic today for a follow-up visit.  The patient is feeling fairly well today without any concerning complaints except he has had some weakness and need for blood transfusions with chemotherapy despite having his dose of carboplatin and Alimta being reduced.  He needed 2 units of blood on 06/17/2021.  Since receiving his blood transfusion, the patient is feeling better. He can tell when he is significantly anemic because he is also undergoing physical therapy and notes decreased exercise tolerance, weakness, and pallor. Of note, the patient is on a blood thinner for Xarelto due to his atrial fibrillation.  He denies any known bleeding or bruising except he has intermittent periods of epistaxis. His last nose bleed was ~1 month ago. He uses ice and holds his blood  thinner for a day with improvement. He previously saw ENT who stated that he had a deviated septum. No areas to cauterize reportedly. He is not on an iron supplement because it causes constipation.   The patient is also interested in receiving his 4th covid booster vaccine and flu shot.   He denies any recent fever, chills, or night sweats.  His appetite comes and goes, but overall, he notes improvement in his appetite despite losing 3 lbs since his last appointment. He drinks 1 boost/ensure daily but is going to try to drink 2. He denies any nausea, vomiting, or diarrhea. He has mild constipation which he manages with stool softener and miralax.   The patient reports his baseline dyspnea on exertion.  He also notes a baseline cough which he uses tessalon with improvement in his cough. He uses his inhaler as needed if he feels like he is wheezing.  He denies any chest pain or any current hemoptysis.  He denies any headache or visual changes except he needs to see an eye doctor for cataracts soon.  He denies any rashes or skin changes.  The patient is here today for evaluation and repeat blood work before considering starting cycle #5.    MEDICAL HISTORY: Past Medical History:  Diagnosis Date   Anemia    Anxiety    Arthritis    "back, right knee, hands, ankles, neck" (05/31/2016)   Carotid artery disease ([place]) 08/30/2016   Carotid US 11/19: R 1-39, L 40-59 // Carotid US 08/2019: R 1-39; L 40-59 // Carotid US 11/21: R  1-39; L 40-59>> repeat 1 year    Chronic lower back pain    Coronary atherosclerosis of native coronary artery    a. BMS to [facility] 2004 and 2007, otherwise mild nonobstructive disease. EF normal.   Diverticulitis    Dyslipidemia    Dyspnea    Essential hypertension, benign    Family history of breast cancer    Family history of lung cancer    Family history of prostate cancer    GERD (gastroesophageal reflux disease)    Lumbar radiculopathy, chronic 02/04/2015   Right L5    Obesity    OSA on CPAP    uses cpap   Paroxysmal atrial fibrillation ([place])    a. Discovered after stroke.   Pneumonia 01/1996   PONV (postoperative nausea and vomiting)    Prostate CA ([place])    Recurrent upper respiratory infection (URI)    Stroke ([place]) 07/2012   no deficits   Visit for monitoring Tikosyn therapy 09/18/2019    ALLERGIES:  is allergic to amoxicillin, fosinopril, hydromorphone, ampicillin, monopril [fosinopril sodium], other, and rosuvastatin.  MEDICATIONS:  Current Outpatient Medications  Medication Sig Dispense Refill   acetaminophen (TYLENOL 8 HOUR ARTHRITIS PAIN) 650 MG CR tablet Take 1,300 mg by mouth every 8 (eight) hours as needed for pain (pain).     albuterol (VENTOLIN HFA) 108 (90 Base) MCG/ACT inhaler Inhale 2 puffs into the lungs every 4 (four) hours as needed for wheezing or shortness of breath. Inhale 2 puffs into the lungs every 4 hours as needed 18 g 0   ALPRAZolam (XANAX) 0.5 MG tablet Take 1 tablet (0.5 mg total) by mouth at bedtime as needed for anxiety. 10 tablet 0   atorvastatin (LIPITOR) 40 MG tablet Take 1 tablet (40 mg total) by mouth daily. 90 tablet 3   benzonatate (TESSALON) 200 MG capsule Take 1 capsule (200 mg total) by mouth 3 (three) times daily as needed for cough. 60 capsule 1   Carbinoxamine Maleate 6 MG TABS Take 1 tablet by mouth 2 (two) times daily as needed (drainage). 60 tablet 4   chlorpheniramine-HYDROcodone (TUSSIONEX) 10-8 MG/5ML SUER Take 5 mLs by mouth every 12 (twelve) hours as needed for cough. 473 mL 0   diclofenac Sodium (VOLTAREN) 1 % GEL Apply 4 g topically 4 (four) times daily. 100 g 0   dicyclomine (BENTYL) 20 MG tablet Take 20 mg by mouth daily as needed for spasms.     diltiazem (CARDIZEM) 30 MG tablet Take 1 tablet (30 mg total) by mouth 4 (four) times daily as needed. 60 tablet 6   dofetilide (TIKOSYN) 500 MCG capsule Take 1 capsule (500 mcg total) by mouth 2 (two) times daily. Please keep upcoming appointment in  February for future refills. Thank you 180 capsule 3   fenofibrate 160 MG tablet TAKE 1 TABLET DAILY. PLEASE KEEP UPCOMING APPT IN MARCH WITH DR. Acie Fredrickson BEFORE ANYMORE REFILLS. (Patient taking differently: Take 160 mg by mouth daily.) 90 tablet 3   fluticasone (FLONASE) 50 MCG/ACT nasal spray Place 2 sprays into both nostrils daily. 68.3 mL 5   folic acid (FOLVITE) 1 MG tablet TAKE 1 TABLET BY MOUTH EVERY DAY 90 tablet 1   ipratropium (ATROVENT) 0.03 % nasal spray Place 1-2 sprays into both nostrils every 12 (twelve) hours. For drainage 30 mL 5   leuprolide, 6 Month, (ELIGARD) 45 MG injection Inject 45 mg into the skin every 6 (six) months.     lidocaine-prilocaine (EMLA) cream Apply 1 application topically  as needed. 30 g 2   loperamide (IMODIUM) 2 MG capsule Take 2 mg by mouth daily as needed for diarrhea or loose stools.     magnesium oxide (MAG-OX) 400 (240 Mg) MG tablet TAKE 1 TABLET BY MOUTH EVERY DAY 90 tablet 2   metoprolol succinate (TOPROL XL) 25 MG 24 hr tablet Take 1 tablet (25 mg total) by mouth daily. 90 tablet 3   Multiple Vitamins-Minerals (MULTIVITAMINS THER. W/MINERALS) TABS Take 1 tablet by mouth daily.     nitroGLYCERIN (NITROSTAT) 0.4 MG SL tablet Place 1 tablet (0.4 mg total) under the tongue every 5 (five) minutes as needed for chest pain. 25 tablet 5   omeprazole (PRILOSEC) 20 MG capsule Take 1 capsule (20 mg total) by mouth daily. 30 capsule 5   ondansetron (ZOFRAN) 8 MG tablet Take 1 tablet (8 mg total) by mouth every 8 (eight) hours as needed for nausea or vomiting. Starting 3 days after chemotherapy 30 tablet 2   potassium chloride (KLOR-CON) 10 MEQ tablet Take 1 tablet (10 mEq total) by mouth daily. 90 tablet 2   promethazine (PHENERGAN) 25 MG tablet Take 1 tablet (25 mg total) by mouth every 6 (six) hours as needed for nausea or vomiting. 30 tablet 1   Saccharomyces boulardii (PROBIOTIC) 250 MG CAPS Take 1 capsule by mouth daily.     sucralfate (CARAFATE) 1 GM/10ML  suspension Take 10 mLs (1 g total) by mouth 4 (four) times daily. Before meals 420 mL 1   tamsulosin (FLOMAX) 0.4 MG CAPS capsule Take 0.4 mg by mouth daily after supper.     Tiotropium Bromide-Olodaterol (STIOLTO RESPIMAT) 2.5-2.5 MCG/ACT AERS Inhale 2 puffs into the lungs daily. 4 g 5   topiramate (TOPAMAX) 50 MG tablet Take 2 tablets (100 mg total) by mouth 2 (two) times daily. 360 tablet 3   traMADol (ULTRAM) 50 MG tablet Take 1 tablet (50 mg total) by mouth every 6 (six) hours as needed for moderate pain. 30 tablet 0   traZODone (DESYREL) 50 MG tablet Take 0.5 tablets (25 mg total) by mouth at bedtime as needed for sleep. 45 tablet 3   Vitamin D, Ergocalciferol, (DRISDOL) 1.25 MG (50000 UNIT) CAPS capsule TAKE 1 CAPSULE BY MOUTH ONE TIME PER WEEK (Patient taking differently: Take 50,000 Units by mouth every 7 (seven) days. Monday) 12 capsule 1   XARELTO 20 MG TABS tablet TAKE 1 TABLET BY MOUTH EVERY DAY (Patient taking differently: 20 mg daily with supper.) 90 tablet 2   solifenacin (VESICARE) 5 MG tablet Take 5 mg by mouth daily. Taken at bedtime (Patient not taking: Reported on 06/22/2021)     No current facility-administered medications for this visit.    SURGICAL HISTORY:  Past Surgical History:  Procedure Laterality Date   ADENOIDECTOMY     ANTERIOR CERVICAL DECOMP/DISCECTOMY FUSION  07/2001; 10/2002   "C5-6; C6-7; redo"   BACK SURGERY     CARPAL TUNNEL RELEASE Left 10/2015   CHEST TUBE INSERTION Left 03/17/2021   Procedure: INSERTION PLEURAL DRAINAGE CATHETER;  Surgeon: Garner Nash, DO;  Location: [facility];  Service: Pulmonary;  Laterality: Left;  Indwelling Tunneled Pleural catheter (PLEUREX)    COLONOSCOPY W/ POLYPECTOMY  02/2014   CORONARY ANGIOPLASTY WITH STENT PLACEMENT  05/2003; 12/2005   "mid RCA; mid RCA"   FINE NEEDLE ASPIRATION  02/26/2021   Procedure: FINE NEEDLE ASPIRATION (FNA) LINEAR;  Surgeon: Candee Furbish, MD;  Location: [facility] ENDOSCOPY;  Service: Pulmonary;;    HAND SURGERY  02/2019   LEFT HAND   implantable loop recorder placement  02/27/2020   [employer] Reveal [place] model [id number]  implantable loop recorder implanted by Dr Rayann Heman for afib management and evaluation of presyncope   IR IMAGING GUIDED PORT INSERTION  04/16/2021   JOINT REPLACEMENT     KNEE ARTHROSCOPY Left 10/2005   KNEE ARTHROSCOPY W/ PARTIAL MEDIAL MENISCECTOMY Left 09/2005   LUMBAR LAMINECTOMY/DECOMPRESSION MICRODISCECTOMY  03/2005   "L4-5"   POSTERIOR LUMBAR FUSION  10/2003   L5-S1; "plates, screws"   SHOULDER ARTHROSCOPY Right 08/2011   Debridement of labrum, arthroscopic distal clavicle excision   SHOULDER OPEN ROTATOR CUFF REPAIR Left 07/2014   TEE WITHOUT CARDIOVERSION  07/07/2012   Procedure: TRANSESOPHAGEAL ECHOCARDIOGRAM (TEE);  Surgeon: Fay Records, MD;  Location: [facility] ENDOSCOPY;  Service: Cardiovascular;  Laterality: N/A;   THORACENTESIS N/A 02/25/2021   Procedure: Mathews Robinsons;  Surgeon: Juanito Doom, MD;  Location: [facility];  Service: Cardiopulmonary;  Laterality: N/A;   TONSILLECTOMY AND ADENOIDECTOMY  ~ 1956   TOTAL KNEE ARTHROPLASTY Left 10/2006   TRIGGER FINGER RELEASE Left 10/2015   VIDEO BRONCHOSCOPY WITH ENDOBRONCHIAL ULTRASOUND Left 02/26/2021   Procedure: VIDEO BRONCHOSCOPY WITH ENDOBRONCHIAL ULTRASOUND;  Surgeon: Candee Furbish, MD;  Location: [facility] ENDOSCOPY;  Service: Pulmonary;  Laterality: Left;  cryoprobe too thanks!    REVIEW OF SYSTEMS:   Review of Systems  Constitutional: Positive for fatigue, weight loss, and intermittently decreased appetite. Negative for chills and fever. HENT:   Negative for mouth sores, nosebleeds, sore throat and trouble swallowing.   Eyes: Negative for eye problems and icterus.  Respiratory: Positive for shortness of breath and cough. Negative for hemoptysis (resolved) and wheezing.   Cardiovascular: Negative for chest pain and leg swelling.  Gastrointestinal: Positive for mild constipation. Negative for  abdominal pain, diarrhea, nausea and vomiting.  Genitourinary: Negative for bladder incontinence, difficulty urinating, dysuria, frequency and hematuria.   Musculoskeletal: Negative for back pain, gait problem, neck pain and neck stiffness.  Skin: Negative for itching and rash.  Neurological: Negative for dizziness, extremity weakness, gait problem, headaches, light-headedness and seizures.  Hematological: Negative for adenopathy. Does not bruise/bleed easily.  Psychiatric/Behavioral: Negative for confusion, depression and sleep disturbance. The patient is not nervous/anxious.     PHYSICAL EXAMINATION:  Blood pressure 119/68, pulse 92, temperature (!) 96.7 F (35.9 C), temperature source Tympanic, resp. rate 18, weight 235 lb 2 oz (106.7 kg), SpO2 92 %.  ECOG PERFORMANCE STATUS: 1-2  Physical Exam  Constitutional: Oriented to person, place, and time and well-developed, well-nourished, and in no distress. HENT: Head: Normocephalic and atraumatic. Mouth/Throat: Oropharynx is clear and moist. No oropharyngeal exudate. Eyes: Conjunctivae are normal. Right eye exhibits no discharge. Left eye exhibits no discharge. No scleral icterus. Neck: Normal range of motion. Neck supple. Cardiovascular: Normal rate, regular rhythm, normal heart sounds and intact distal pulses.   Pulmonary/Chest: Effort normal. No respiratory distress. No wheezes. Some mild crackles in left lower lobe.  Abdominal: Soft. Bowel sounds are normal. Exhibits no distension and no mass. There is no tenderness.  Musculoskeletal: Normal range of motion. Exhibits no edema.  Lymphadenopathy:    No cervical adenopathy.  Neurological: Alert and oriented to person, place, and time. Exhibits normal muscle tone. Ambulates with a cane.  Skin: Skin is warm and dry. No rash noted. Not diaphoretic. No erythema. No pallor.  Psychiatric: Mood, memory and judgment normal. Vitals reviewed.  LABORATORY DATA: Lab Results  Component Value  Date   WBC 6.0  06/22/2021   HGB 9.2 (L) 06/22/2021   HCT 28.0 (L) 06/22/2021   MCV 93.6 06/22/2021   PLT 183 06/22/2021      Chemistry      Component Value Date/Time   NA 134 (L) 06/22/2021 0837   NA 137 01/21/2021 1553   K 4.2 06/22/2021 0837   CL 105 06/22/2021 0837   CO2 23 06/22/2021 0837   BUN 17 06/22/2021 0837   BUN 17 01/21/2021 1553   CREATININE 0.83 06/22/2021 0837   CREATININE 1.04 08/30/2016 0840      Component Value Date/Time   CALCIUM 9.1 06/22/2021 0837   ALKPHOS 63 06/22/2021 0837   AST 18 06/22/2021 0837   ALT 11 06/22/2021 0837   BILITOT 0.4 06/22/2021 0837       RADIOGRAPHIC STUDIES:  CT Chest W Contrast  Addendum Date: 06/03/2021   ADDENDUM REPORT: 06/03/2021 11:45 ADDENDUM: Findings correction: Ignore hysterectomy reference. Reproductive: Prostate gland unremarkable. Electronically Signed   By: Suzy Bouchard M.D.   On: 06/03/2021 11:45   Result Date: 06/03/2021 CLINICAL DATA:  Primary Cancer Type: Lung stage IV lung carcinoma with liver metastasis. Imaging Indication: Assess response to therapy Interval therapy since last imaging? Yes Initial Cancer Diagnosis Date: 02/25/2021; Established by: Biopsy-proven Detailed Pathology: Stage IV non-small cell lung cancer, adenocarcinoma. Primary Tumor location: Left lower lobe. Surgeries: Coronary stent.  Lumbar fusion. Chemotherapy: Yes; Ongoing? Yes; Most recent administration: 06/01/2021 Immunotherapy?  Yes; Type: Keytruda; Ongoing? Yes Radiation therapy? 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 EXAM: CT CHEST WITH CONTRAST TECHNIQUE: Multidetector CT imaging of the chest was performed during intravenous contrast administration. CONTRAST:  52m OMNIPAQUE IOHEXOL 350 MG/ML SOLN COMPARISON:  Most recent CT chest 04/01/2021. 03/24/2021 PET-CT. CT chest, abdomen and pelvis 02/24/2021. FINDINGS: CT  CHEST FINDINGS Cardiovascular: Port in the anterior chest wall with tip in distal SVC. Coronary artery calcification and aortic atherosclerotic calcification. No pericardial effusion. Mediastinum/Nodes: No axillary supraclavicular adenopathy. Prevascular lymph nodes are decreased in size. For example 9 mm node (image 29) decreased from 12 mm. Subcarinal node measures 14 mm similar to 14 mm. Perihilar thickening on the LEFT is similar prior. Lungs/Pleura: Interstitial thickening and consolidation with air bronchograms in the LEFT lower lobe is similar prior. There is volume loss in the LEFT hemithorax. No new nodularity. RIGHT lung clear. Musculoskeletal: No aggressive osseous lesion. CT ABDOMEN AND PELVIS FINDINGS Hepatobiliary: Lesion in the caudate lobe is decreased from comparison PET-CT exam measuring 2.5 cm compared to 4.6 cm. Other hepatic lesions are not evident on the CT portion of the previous PET exam. No new hepatic lesions are evident. Pancreas: Pancreas is normal. No ductal dilatation. No pancreatic inflammation. Spleen: Normal spleen Adrenals/urinary tract: Adrenal glands, kidneys bladder normal. Stomach/Bowel: Stomach, small bowel, appendix, and cecum are normal. Multiple diverticula of the descending colon and sigmoid colon without acute inflammation. Vascular/Lymphatic: Abdominal aorta is normal caliber with atherosclerotic calcification. There is no retroperitoneal or periportal lymphadenopathy. No pelvic lymphadenopathy. Reproductive: Hysterectomy. Other: No peritoneal metastasis Musculoskeletal: No aggressive osseous lesion. Posterior lumbar fusion. IMPRESSION: Chest Impression: 1. No change in LEFT infrahilar peribronchial thickening and LEFT lower lobe interstitial thickening and consolidation. 2. Persistent mediastinal adenopathy. Several prevascular nodes are decreased in size. 3. No evidence disease progression in thorax. Abdomen / Pelvis Impression: 1. The dominant hepatic metastasis is  decreased in size. Other previously hypermetabolic lesions are not evident on the noncontrast  CT from prior PET-CT scan or recurrent exam. No new hepatic lesion identified. 2. No evidence of new metastatic disease in the abdomen pelvis. 3. No evidence skeletal metastasis Electronically Signed: By: Suzy Bouchard M.D. On: 06/03/2021 10:50   CT Abdomen Pelvis W Contrast  Addendum Date: 06/03/2021   ADDENDUM REPORT: 06/03/2021 11:45 ADDENDUM: Findings correction: Ignore hysterectomy reference. Reproductive: Prostate gland unremarkable. Electronically Signed   By: Suzy Bouchard M.D.   On: 06/03/2021 11:45   Result Date: 06/03/2021 CLINICAL DATA:  Primary Cancer Type: Lung stage IV lung carcinoma with liver metastasis. Imaging Indication: Assess response to therapy Interval therapy since last imaging? Yes Initial Cancer Diagnosis Date: 02/25/2021; Established by: Biopsy-proven Detailed Pathology: Stage IV non-small cell lung cancer, adenocarcinoma. Primary Tumor location: Left lower lobe. Surgeries: Coronary stent.  Lumbar fusion. Chemotherapy: Yes; Ongoing? Yes; Most recent administration: 06/01/2021 Immunotherapy?  Yes; Type: Keytruda; Ongoing? Yes Radiation therapy? 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 EXAM: CT CHEST WITH CONTRAST TECHNIQUE: Multidetector CT imaging of the chest was performed during intravenous contrast administration. CONTRAST:  41m OMNIPAQUE IOHEXOL 350 MG/ML SOLN COMPARISON:  Most recent CT chest 04/01/2021. 03/24/2021 PET-CT. CT chest, abdomen and pelvis 02/24/2021. FINDINGS: CT CHEST FINDINGS Cardiovascular: Port in the anterior chest wall with tip in distal SVC. Coronary artery calcification and aortic atherosclerotic calcification. No pericardial effusion. Mediastinum/Nodes: No axillary supraclavicular adenopathy. Prevascular lymph nodes are decreased in size.  For example 9 mm node (image 29) decreased from 12 mm. Subcarinal node measures 14 mm similar to 14 mm. Perihilar thickening on the LEFT is similar prior. Lungs/Pleura: Interstitial thickening and consolidation with air bronchograms in the LEFT lower lobe is similar prior. There is volume loss in the LEFT hemithorax. No new nodularity. RIGHT lung clear. Musculoskeletal: No aggressive osseous lesion. CT ABDOMEN AND PELVIS FINDINGS Hepatobiliary: Lesion in the caudate lobe is decreased from comparison PET-CT exam measuring 2.5 cm compared to 4.6 cm. Other hepatic lesions are not evident on the CT portion of the previous PET exam. No new hepatic lesions are evident. Pancreas: Pancreas is normal. No ductal dilatation. No pancreatic inflammation. Spleen: Normal spleen Adrenals/urinary tract: Adrenal glands, kidneys bladder normal. Stomach/Bowel: Stomach, small bowel, appendix, and cecum are normal. Multiple diverticula of the descending colon and sigmoid colon without acute inflammation. Vascular/Lymphatic: Abdominal aorta is normal caliber with atherosclerotic calcification. There is no retroperitoneal or periportal lymphadenopathy. No pelvic lymphadenopathy. Reproductive: Hysterectomy. Other: No peritoneal metastasis Musculoskeletal: No aggressive osseous lesion. Posterior lumbar fusion. IMPRESSION: Chest Impression: 1. No change in LEFT infrahilar peribronchial thickening and LEFT lower lobe interstitial thickening and consolidation. 2. Persistent mediastinal adenopathy. Several prevascular nodes are decreased in size. 3. No evidence disease progression in thorax. Abdomen / Pelvis Impression: 1. The dominant hepatic metastasis is decreased in size. Other previously hypermetabolic lesions are not evident on the noncontrast CT from prior PET-CT scan or recurrent exam. No new hepatic lesion identified. 2. No evidence of new metastatic disease in the abdomen pelvis. 3. No evidence skeletal metastasis Electronically  Signed: By: SSuzy BouchardM.D. On: 06/03/2021 10:50   DG Chest Portable 1 View  Result Date: 06/05/2021 CLINICAL DATA:  Weakness, shortness of breath EXAM: PORTABLE CHEST 1 VIEW COMPARISON:  Chest radiographs, 05/20/2021, CT chest, 06/02/2021 FINDINGS: Cardiomegaly with implantable loop recorder. Unchanged post treatment appearance of the left chest. No new or acute appearing airspace opacity. The visualized skeletal structures are unremarkable.  IMPRESSION: Cardiomegaly with unchanged post treatment appearance of the left chest. No new or acute appearing airspace opacity. Electronically Signed   By: Eddie Candle M.D.   On: 06/05/2021 13:32   CUP PACEART REMOTE DEVICE CHECK  Result Date: 06/09/2021 ILR summary report received. Battery status OK. Normal device function. No new symptom, tachy, brady, or pause episodes. No new AF episodes. 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  He is negative for any actionable mutations. His PDL1 expression is 30%.  The patient completed palliative radiotherapy to the obstructive lung mass in the left lung under the care of Dr. Tammi Klippel.  Last treatment on 03/23/2021.   He is currently undergoing palliative systemic chemotherapy. His treatment consists carboplatin for an AUC of 5, Alimta 500 mg/m, and Keytruda 200 mg IV every 3 weeks. He is status post 4 cycles.  He is having progressive fatigue and weakness with treatment and anemia requiring transfusion support.  His dose of carboplatin was reduced as an AUC of 4 and his Alimta was reduced to 400 mg  per metered square.   Starting from cycle #5, the patient will begin maintenance Alimta 400 mg per metered squared and Keytruda 200 mg IV.   Labs were reviewed. He does not need a blood transfusion today but we will keep a close eye on his Hbg weekly and arrange a blood transfusion with 2 units of his Hbg is <8. He denies significant or recent bleeding. He knows to hold his blood thinner for 1 day if he has epistaxis. He also knows if he has significant bleeding to seek emergency evaluation.   Recommend that he proceed with cycle #5 today scheduled with reduced dose Alimta 400 mg/m2 and Keytruda 200 mg IV every 3 weeks.   We are checking iron studies on him. I will call him with the results to discuss iron infusions or oral iron supplements if needed.   He will increase his boost/ensure to 2x per day.   Discussed we will be offering the flu vaccine in the clinic in the coming weeks. I will arrange for his 4th COVID vaccine in 2 weeks from now (1 week before his next cycle of treatment).  We will see him back for follow-up visit in 3 weeks for evaluation before starting cycle #6.  The patient was advised to call immediately if he has any concerning symptoms in the interval. The patient voices understanding of current disease status and treatment options and is in agreement with the current care plan. All questions were answered. The patient knows to call the clinic with any problems, questions or concerns. We can certainly see the patient much sooner if necessary     Orders Placed This Encounter  Procedures   CBC with Differential ([place] Only)    Standing Status:   Standing    Number of Occurrences:   3    Standing Expiration Date:   06/22/2022   CMP ([place] only)    Standing Status:   Standing    Number of Occurrences:   3    Standing Expiration Date:   06/22/2022  TSH    Standing Status:   Standing    Number of Occurrences:   30    Standing Expiration Date:    06/22/2022   Sample to Blood Bank    Standing Status:   Standing    Number of Occurrences:   3    Standing Expiration Date:   06/22/2022     The total time spent in the appointment was 20-29 minutes.   Devri Kreher L Ednamae Schiano, PA-C 06/22/21

## 2021-06-22 ENCOUNTER — Other Ambulatory Visit: Payer: Self-pay

## 2021-06-22 ENCOUNTER — Inpatient Hospital Stay: Payer: Medicare PPO

## 2021-06-22 ENCOUNTER — Other Ambulatory Visit: Payer: Self-pay | Admitting: Physician Assistant

## 2021-06-22 ENCOUNTER — Inpatient Hospital Stay (HOSPITAL_BASED_OUTPATIENT_CLINIC_OR_DEPARTMENT_OTHER): Payer: Medicare PPO | Admitting: Physician Assistant

## 2021-06-22 VITALS — BP 119/68 | HR 92 | Temp 96.7°F | Resp 18 | Wt 235.1 lb

## 2021-06-22 DIAGNOSIS — E611 Iron deficiency: Secondary | ICD-10-CM

## 2021-06-22 DIAGNOSIS — C787 Secondary malignant neoplasm of liver and intrahepatic bile duct: Secondary | ICD-10-CM | POA: Diagnosis not present

## 2021-06-22 DIAGNOSIS — Z95828 Presence of other vascular implants and grafts: Secondary | ICD-10-CM

## 2021-06-22 DIAGNOSIS — J910 Malignant pleural effusion: Secondary | ICD-10-CM | POA: Diagnosis not present

## 2021-06-22 DIAGNOSIS — C778 Secondary and unspecified malignant neoplasm of lymph nodes of multiple regions: Secondary | ICD-10-CM | POA: Diagnosis not present

## 2021-06-22 DIAGNOSIS — D649 Anemia, unspecified: Secondary | ICD-10-CM

## 2021-06-22 DIAGNOSIS — C3492 Malignant neoplasm of unspecified part of left bronchus or lung: Secondary | ICD-10-CM

## 2021-06-22 DIAGNOSIS — Z5111 Encounter for antineoplastic chemotherapy: Secondary | ICD-10-CM | POA: Diagnosis not present

## 2021-06-22 DIAGNOSIS — Z79899 Other long term (current) drug therapy: Secondary | ICD-10-CM | POA: Diagnosis not present

## 2021-06-22 DIAGNOSIS — D6481 Anemia due to antineoplastic chemotherapy: Secondary | ICD-10-CM | POA: Diagnosis not present

## 2021-06-22 DIAGNOSIS — Z5112 Encounter for antineoplastic immunotherapy: Secondary | ICD-10-CM | POA: Diagnosis not present

## 2021-06-22 DIAGNOSIS — C3412 Malignant neoplasm of upper lobe, left bronchus or lung: Secondary | ICD-10-CM | POA: Diagnosis not present

## 2021-06-22 LAB — CMP (CANCER CENTER ONLY)
ALT: 11 U/L (ref 0–44)
AST: 18 U/L (ref 15–41)
Albumin: 2.9 g/dL — ABNORMAL LOW (ref 3.5–5.0)
Alkaline Phosphatase: 63 U/L (ref 38–126)
Anion gap: 6 (ref 5–15)
BUN: 17 mg/dL (ref 8–23)
CO2: 23 mmol/L (ref 22–32)
Calcium: 9.1 mg/dL (ref 8.9–10.3)
Chloride: 105 mmol/L (ref 98–111)
Creatinine: 0.83 mg/dL (ref 0.61–1.24)
GFR, Estimated: >60 mL/min (ref >60)
Glucose, Bld: 111 mg/dL — ABNORMAL HIGH (ref 70–99)
Potassium: 4.2 mmol/L (ref 3.5–5.1)
Sodium: 134 mmol/L — ABNORMAL LOW (ref 135–145)
Total Bilirubin: 0.4 mg/dL (ref 0.3–1.2)
Total Protein: 6.5 g/dL (ref 6.5–8.1)

## 2021-06-22 LAB — CBC WITH DIFFERENTIAL (CANCER CENTER ONLY)
Abs Immature Granulocytes: 0.24 10*3/uL — ABNORMAL HIGH (ref 0.00–0.07)
Basophils Absolute: 0 10*3/uL (ref 0.0–0.1)
Basophils Relative: 1 %
Eosinophils Absolute: 0.2 10*3/uL (ref 0.0–0.5)
Eosinophils Relative: 4 %
HCT: 28 % — ABNORMAL LOW (ref 39.0–52.0)
Hemoglobin: 9.2 g/dL — ABNORMAL LOW (ref 13.0–17.0)
Immature Granulocytes: 4 %
Lymphocytes Relative: 4 %
Lymphs Abs: 0.2 10*3/uL — ABNORMAL LOW (ref 0.7–4.0)
MCH: 30.8 pg (ref 26.0–34.0)
MCHC: 32.9 g/dL (ref 30.0–36.0)
MCV: 93.6 fL (ref 80.0–100.0)
Monocytes Absolute: 0.9 10*3/uL (ref 0.1–1.0)
Monocytes Relative: 14 %
Neutro Abs: 4.5 10*3/uL (ref 1.7–7.7)
Neutrophils Relative %: 73 %
Platelet Count: 183 10*3/uL (ref 150–400)
RBC: 2.99 MIL/uL — ABNORMAL LOW (ref 4.22–5.81)
RDW: 21.4 % — ABNORMAL HIGH (ref 11.5–15.5)
WBC Count: 6 10*3/uL (ref 4.0–10.5)
nRBC: 0 % (ref 0.0–0.2)

## 2021-06-22 LAB — IRON AND TIBC
Iron: 70 ug/dL (ref 42–163)
Saturation Ratios: 26 % (ref 20–55)
TIBC: 270 ug/dL (ref 202–409)
UIBC: 200 ug/dL (ref 117–376)

## 2021-06-22 LAB — FERRITIN: Ferritin: 1134 ng/mL — ABNORMAL HIGH (ref 24–336)

## 2021-06-22 LAB — SAMPLE TO BLOOD BANK

## 2021-06-22 MED ORDER — PROCHLORPERAZINE MALEATE 10 MG PO TABS
10.0000 mg | ORAL_TABLET | Freq: Once | ORAL | Status: AC
  Administered 2021-06-22: 10 mg via ORAL
  Filled 2021-06-22: qty 1

## 2021-06-22 MED ORDER — SODIUM CHLORIDE 0.9% FLUSH
10.0000 mL | Freq: Once | INTRAVENOUS | Status: AC
  Administered 2021-06-22: 10 mL

## 2021-06-22 MED ORDER — SODIUM CHLORIDE 0.9% FLUSH
10.0000 mL | INTRAVENOUS | Status: DC | PRN
Start: 2021-06-22 — End: 2021-06-22
  Administered 2021-06-22: 10 mL

## 2021-06-22 MED ORDER — SODIUM CHLORIDE 0.9 % IV SOLN
200.0000 mg | Freq: Once | INTRAVENOUS | Status: AC
  Administered 2021-06-22: 200 mg via INTRAVENOUS
  Filled 2021-06-22: qty 8

## 2021-06-22 MED ORDER — HEPARIN SOD (PORK) LOCK FLUSH 100 UNIT/ML IV SOLN
500.0000 [IU] | Freq: Once | INTRAVENOUS | Status: AC | PRN
  Administered 2021-06-22: 500 [IU]

## 2021-06-22 MED ORDER — SODIUM CHLORIDE 0.9 % IV SOLN: Freq: Once | INTRAVENOUS | Status: AC

## 2021-06-22 MED ORDER — SODIUM CHLORIDE 0.9 % IV SOLN
400.0000 mg/m2 | Freq: Once | INTRAVENOUS | Status: AC
  Administered 2021-06-22: 900 mg via INTRAVENOUS
  Filled 2021-06-22: qty 20

## 2021-06-22 NOTE — Patient Instructions (Signed)
[facility]  Discharge Instructions: Thank you for choosing [facility] to provide your oncology and hematology care.   If you have a lab appointment with the Cancer Center, please go directly to the Cancer Center and check in at the registration area.   Wear comfortable clothing and clothing appropriate for easy access to any Portacath or PICC line.   We strive to give you quality time with your provider. You may need to reschedule your appointment if you arrive late (15 or more minutes).  Arriving late affects you and other patients whose appointments are after yours.  Also, if you miss three or more appointments without notifying the office, you may be dismissed from the clinic at the provider's discretion.      For prescription refill requests, have your pharmacy contact our office and allow 72 hours for refills to be completed.    Today you received the following chemotherapy and/or immunotherapy agents: Keytruda/Alimta.      To help prevent nausea and vomiting after your treatment, we encourage you to take your nausea medication as directed.  BELOW ARE SYMPTOMS THAT SHOULD BE REPORTED IMMEDIATELY: *FEVER GREATER THAN 100.4 F (38 C) OR HIGHER *CHILLS OR SWEATING *NAUSEA AND VOMITING THAT IS NOT CONTROLLED WITH YOUR NAUSEA MEDICATION *UNUSUAL SHORTNESS OF BREATH *UNUSUAL BRUISING OR BLEEDING *URINARY PROBLEMS (pain or burning when urinating, or frequent urination) *BOWEL PROBLEMS (unusual diarrhea, constipation, pain near the anus) TENDERNESS IN MOUTH AND THROAT WITH OR WITHOUT PRESENCE OF ULCERS (sore throat, sores in mouth, or a toothache) UNUSUAL RASH, SWELLING OR PAIN  UNUSUAL VAGINAL DISCHARGE OR ITCHING   Items with * indicate a potential emergency and should be followed up as soon as possible or go to the Emergency Department if any problems should occur.  Please show the CHEMOTHERAPY ALERT CARD or IMMUNOTHERAPY ALERT CARD at  check-in to the Emergency Department and triage nurse.  Should you have questions after your visit or need to cancel or reschedule your appointment, please contact [facility]  Dept: [phone]  and follow the prompts.  Office hours are 8:00 a.m. to 4:30 p.m. Monday - Friday. Please note that voicemails left after 4:00 p.m. may not be returned until the following business day.  We are closed weekends and major holidays. You have access to a nurse at all times for urgent questions. Please call the main number to the clinic Dept: [phone] and follow the prompts.   For any non-urgent questions, you may also contact your provider using MyChart. We now offer e-Visits for anyone 18 and older to request care online for non-urgent symptoms. For details visit [email or web address].   Also download the MyChart app! Go to the app store, search "MyChart", open the app, select , and log in with your MyChart username and password.  Due to Covid, a mask is required upon entering the hospital/clinic. If you do not have a mask, one will be given to you upon arrival. For doctor visits, patients may have 1 support person aged 18 or older with them. For treatment visits, patients cannot have anyone with them due to current Covid guidelines and our immunocompromised population.   

## 2021-06-24 DIAGNOSIS — D6481 Anemia due to antineoplastic chemotherapy: Secondary | ICD-10-CM | POA: Diagnosis not present

## 2021-06-24 DIAGNOSIS — C3432 Malignant neoplasm of lower lobe, left bronchus or lung: Secondary | ICD-10-CM | POA: Diagnosis not present

## 2021-06-24 DIAGNOSIS — J449 Chronic obstructive pulmonary disease, unspecified: Secondary | ICD-10-CM | POA: Diagnosis not present

## 2021-06-24 DIAGNOSIS — I10 Essential (primary) hypertension: Secondary | ICD-10-CM | POA: Diagnosis not present

## 2021-06-24 DIAGNOSIS — I480 Paroxysmal atrial fibrillation: Secondary | ICD-10-CM | POA: Diagnosis not present

## 2021-06-24 DIAGNOSIS — I2510 Atherosclerotic heart disease of native coronary artery without angina pectoris: Secondary | ICD-10-CM | POA: Diagnosis not present

## 2021-06-24 DIAGNOSIS — C61 Malignant neoplasm of prostate: Secondary | ICD-10-CM | POA: Diagnosis not present

## 2021-06-24 DIAGNOSIS — T451X5D Adverse effect of antineoplastic and immunosuppressive drugs, subsequent encounter: Secondary | ICD-10-CM | POA: Diagnosis not present

## 2021-06-24 DIAGNOSIS — D630 Anemia in neoplastic disease: Secondary | ICD-10-CM | POA: Diagnosis not present

## 2021-06-26 DIAGNOSIS — I2510 Atherosclerotic heart disease of native coronary artery without angina pectoris: Secondary | ICD-10-CM | POA: Diagnosis not present

## 2021-06-26 DIAGNOSIS — J449 Chronic obstructive pulmonary disease, unspecified: Secondary | ICD-10-CM | POA: Diagnosis not present

## 2021-06-26 DIAGNOSIS — D630 Anemia in neoplastic disease: Secondary | ICD-10-CM | POA: Diagnosis not present

## 2021-06-26 DIAGNOSIS — T451X5D Adverse effect of antineoplastic and immunosuppressive drugs, subsequent encounter: Secondary | ICD-10-CM | POA: Diagnosis not present

## 2021-06-26 DIAGNOSIS — C61 Malignant neoplasm of prostate: Secondary | ICD-10-CM | POA: Diagnosis not present

## 2021-06-26 DIAGNOSIS — C3432 Malignant neoplasm of lower lobe, left bronchus or lung: Secondary | ICD-10-CM | POA: Diagnosis not present

## 2021-06-26 DIAGNOSIS — D6481 Anemia due to antineoplastic chemotherapy: Secondary | ICD-10-CM | POA: Diagnosis not present

## 2021-06-26 DIAGNOSIS — I10 Essential (primary) hypertension: Secondary | ICD-10-CM | POA: Diagnosis not present

## 2021-06-26 DIAGNOSIS — I480 Paroxysmal atrial fibrillation: Secondary | ICD-10-CM | POA: Diagnosis not present

## 2021-06-29 ENCOUNTER — Other Ambulatory Visit: Payer: Self-pay | Admitting: Physician Assistant

## 2021-06-29 ENCOUNTER — Inpatient Hospital Stay: Payer: Medicare PPO

## 2021-06-29 ENCOUNTER — Other Ambulatory Visit: Payer: Self-pay

## 2021-06-29 ENCOUNTER — Telehealth: Payer: Self-pay | Admitting: Physician Assistant

## 2021-06-29 DIAGNOSIS — D649 Anemia, unspecified: Secondary | ICD-10-CM

## 2021-06-29 DIAGNOSIS — Z79899 Other long term (current) drug therapy: Secondary | ICD-10-CM | POA: Diagnosis not present

## 2021-06-29 DIAGNOSIS — C778 Secondary and unspecified malignant neoplasm of lymph nodes of multiple regions: Secondary | ICD-10-CM | POA: Diagnosis not present

## 2021-06-29 DIAGNOSIS — J910 Malignant pleural effusion: Secondary | ICD-10-CM | POA: Diagnosis not present

## 2021-06-29 DIAGNOSIS — C3492 Malignant neoplasm of unspecified part of left bronchus or lung: Secondary | ICD-10-CM

## 2021-06-29 DIAGNOSIS — C3412 Malignant neoplasm of upper lobe, left bronchus or lung: Secondary | ICD-10-CM | POA: Diagnosis not present

## 2021-06-29 DIAGNOSIS — Z5112 Encounter for antineoplastic immunotherapy: Secondary | ICD-10-CM | POA: Diagnosis not present

## 2021-06-29 DIAGNOSIS — C787 Secondary malignant neoplasm of liver and intrahepatic bile duct: Secondary | ICD-10-CM | POA: Diagnosis not present

## 2021-06-29 DIAGNOSIS — D6481 Anemia due to antineoplastic chemotherapy: Secondary | ICD-10-CM | POA: Diagnosis not present

## 2021-06-29 DIAGNOSIS — Z95828 Presence of other vascular implants and grafts: Secondary | ICD-10-CM

## 2021-06-29 DIAGNOSIS — Z5111 Encounter for antineoplastic chemotherapy: Secondary | ICD-10-CM | POA: Diagnosis not present

## 2021-06-29 LAB — TSH: TSH: 1.315 u[IU]/mL (ref 0.320–4.118)

## 2021-06-29 LAB — CBC WITH DIFFERENTIAL (CANCER CENTER ONLY)
Abs Immature Granulocytes: 0.09 10*3/uL — ABNORMAL HIGH (ref 0.00–0.07)
Basophils Absolute: 0 10*3/uL (ref 0.0–0.1)
Basophils Relative: 1 %
Eosinophils Absolute: 0 10*3/uL (ref 0.0–0.5)
Eosinophils Relative: 3 %
HCT: 21.6 % — ABNORMAL LOW (ref 39.0–52.0)
Hemoglobin: 7.3 g/dL — ABNORMAL LOW (ref 13.0–17.0)
Immature Granulocytes: 8 %
Lymphocytes Relative: 6 %
Lymphs Abs: 0.1 10*3/uL — ABNORMAL LOW (ref 0.7–4.0)
MCH: 30.9 pg (ref 26.0–34.0)
MCHC: 33.8 g/dL (ref 30.0–36.0)
MCV: 91.5 fL (ref 80.0–100.0)
Monocytes Absolute: 0.1 10*3/uL (ref 0.1–1.0)
Monocytes Relative: 13 %
Neutro Abs: 0.8 10*3/uL — ABNORMAL LOW (ref 1.7–7.7)
Neutrophils Relative %: 69 %
Platelet Count: 130 10*3/uL — ABNORMAL LOW (ref 150–400)
RBC: 2.36 MIL/uL — ABNORMAL LOW (ref 4.22–5.81)
RDW: 20.5 % — ABNORMAL HIGH (ref 11.5–15.5)
WBC Count: 1.1 10*3/uL — ABNORMAL LOW (ref 4.0–10.5)
nRBC: 0 % (ref 0.0–0.2)

## 2021-06-29 LAB — CMP (CANCER CENTER ONLY)
ALT: 12 U/L (ref 0–44)
AST: 20 U/L (ref 15–41)
Albumin: 2.5 g/dL — ABNORMAL LOW (ref 3.5–5.0)
Alkaline Phosphatase: 50 U/L (ref 38–126)
Anion gap: 7 (ref 5–15)
BUN: 17 mg/dL (ref 8–23)
CO2: 21 mmol/L — ABNORMAL LOW (ref 22–32)
Calcium: 8.8 mg/dL — ABNORMAL LOW (ref 8.9–10.3)
Chloride: 101 mmol/L (ref 98–111)
Creatinine: 0.78 mg/dL (ref 0.61–1.24)
GFR, Estimated: >60 mL/min (ref >60)
Glucose, Bld: 120 mg/dL — ABNORMAL HIGH (ref 70–99)
Potassium: 4 mmol/L (ref 3.5–5.1)
Sodium: 129 mmol/L — ABNORMAL LOW (ref 135–145)
Total Bilirubin: 0.7 mg/dL (ref 0.3–1.2)
Total Protein: 6.3 g/dL — ABNORMAL LOW (ref 6.5–8.1)

## 2021-06-29 LAB — SAMPLE TO BLOOD BANK

## 2021-06-29 LAB — PREPARE RBC (CROSSMATCH)

## 2021-06-29 MED ORDER — SODIUM CHLORIDE 0.9% FLUSH
10.0000 mL | Freq: Once | INTRAVENOUS | Status: AC
  Administered 2021-06-29: 10 mL

## 2021-06-29 MED ORDER — HEPARIN SOD (PORK) LOCK FLUSH 100 UNIT/ML IV SOLN
500.0000 [IU] | Freq: Once | INTRAVENOUS | Status: AC
  Administered 2021-06-29: 500 [IU]

## 2021-06-29 NOTE — Telephone Encounter (Signed)
I called the patient but was unable to reach him. I called his wife to let her know that her husband will need two units of blood as well as possibly G-CSF injections. She denies any recent bleeding. We will call them later today with the appointment. His wife also states his cough has increased with clear phlegm. He is unsure if this is post nasal drainage or malignancy as he has significant allergies. He is only taking his cough medication at night. Denies sick contacts or signs of infection. She reports he is cold and his hands are cold which is likely due to his anemia. Denies fevers, loss of taste/smell, or sore throat. CT scan from 06/02/21 shows improvement in his malignancy. Advised to try mucinex, nasal spray, anti-histamine, and to take his cough medication in the AM as well to see if it helps. However, if he develops worsening symptoms or any symptoms of infection, advised to call sooner so he can be brought in to be evaluated.

## 2021-06-30 ENCOUNTER — Inpatient Hospital Stay: Payer: Medicare PPO

## 2021-06-30 DIAGNOSIS — Z5112 Encounter for antineoplastic immunotherapy: Secondary | ICD-10-CM | POA: Diagnosis not present

## 2021-06-30 DIAGNOSIS — C3412 Malignant neoplasm of upper lobe, left bronchus or lung: Secondary | ICD-10-CM | POA: Diagnosis not present

## 2021-06-30 DIAGNOSIS — C778 Secondary and unspecified malignant neoplasm of lymph nodes of multiple regions: Secondary | ICD-10-CM | POA: Diagnosis not present

## 2021-06-30 DIAGNOSIS — C787 Secondary malignant neoplasm of liver and intrahepatic bile duct: Secondary | ICD-10-CM | POA: Diagnosis not present

## 2021-06-30 DIAGNOSIS — D649 Anemia, unspecified: Secondary | ICD-10-CM

## 2021-06-30 DIAGNOSIS — Z79899 Other long term (current) drug therapy: Secondary | ICD-10-CM | POA: Diagnosis not present

## 2021-06-30 DIAGNOSIS — D6481 Anemia due to antineoplastic chemotherapy: Secondary | ICD-10-CM | POA: Diagnosis not present

## 2021-06-30 DIAGNOSIS — J910 Malignant pleural effusion: Secondary | ICD-10-CM | POA: Diagnosis not present

## 2021-06-30 DIAGNOSIS — Z5111 Encounter for antineoplastic chemotherapy: Secondary | ICD-10-CM | POA: Diagnosis not present

## 2021-06-30 MED ORDER — HEPARIN SOD (PORK) LOCK FLUSH 100 UNIT/ML IV SOLN
500.0000 [IU] | Freq: Every day | INTRAVENOUS | Status: AC | PRN
  Administered 2021-06-30: 500 [IU]

## 2021-06-30 MED ORDER — SODIUM CHLORIDE 0.9% IV SOLUTION
250.0000 mL | Freq: Once | INTRAVENOUS | Status: AC
  Administered 2021-06-30: 250 mL via INTRAVENOUS

## 2021-06-30 MED ORDER — HEPARIN SOD (PORK) LOCK FLUSH 100 UNIT/ML IV SOLN: 250.0000 [IU] | INTRAVENOUS | Status: DC | PRN

## 2021-06-30 MED ORDER — DIPHENHYDRAMINE HCL 25 MG PO CAPS
25.0000 mg | ORAL_CAPSULE | Freq: Once | ORAL | Status: AC
  Administered 2021-06-30: 25 mg via ORAL
  Filled 2021-06-30: qty 1

## 2021-06-30 MED ORDER — ACETAMINOPHEN 325 MG PO TABS
650.0000 mg | ORAL_TABLET | Freq: Once | ORAL | Status: AC
  Administered 2021-06-30: 650 mg via ORAL
  Filled 2021-06-30: qty 2

## 2021-06-30 MED ORDER — SODIUM CHLORIDE 0.9% FLUSH
10.0000 mL | INTRAVENOUS | Status: AC | PRN
  Administered 2021-06-30: 10 mL

## 2021-06-30 MED ORDER — SODIUM CHLORIDE 0.9% FLUSH: 3.0000 mL | INTRAVENOUS | Status: DC | PRN

## 2021-06-30 NOTE — Patient Instructions (Signed)
Blood Transfusion, Adult, Care After This sheet gives you information about how to care for yourself after your procedure. Your doctor may also give you more specific instructions. If you have problems or questions, contact your doctor. What can I expect after the procedure? After the procedure, it is common to have: Bruising and soreness at the IV site. A headache. Follow these instructions at home: Insertion site care   Follow instructions from your doctor about how to take care of your insertion site. This is where an IV tube was put into your vein. Make sure you: Wash your hands with soap and water before and after you change your bandage (dressing). If you cannot use soap and water, use hand sanitizer. Change your bandage as told by your doctor. Check your insertion site every day for signs of infection. Check for: Redness, swelling, or pain. Bleeding from the site. Warmth. Pus or a bad smell. General instructions Take over-the-counter and prescription medicines only as told by your doctor. Rest as told by your doctor. Go back to your normal activities as told by your doctor. Keep all follow-up visits as told by your doctor. This is important. Contact a doctor if: You have itching or red, swollen areas of skin (hives). You feel worried or nervous (anxious). You feel weak after doing your normal activities. You have redness, swelling, warmth, or pain around the insertion site. You have blood coming from the insertion site, and the blood does not stop with pressure. You have pus or a bad smell coming from the insertion site. Get help right away if: You have signs of a serious reaction. This may be coming from an allergy or the body's defense system (immune system). Signs include: Trouble breathing or shortness of breath. Swelling of the face or feeling warm (flushed). Fever or chills. Head, chest, or back pain. Dark pee (urine) or blood in the pee. Widespread rash. Fast  heartbeat. Feeling dizzy or light-headed. You may receive your blood transfusion in an outpatient setting. If so, you will be told whom to contact to report any reactions. These symptoms may be an emergency. Do not wait to see if the symptoms will go away. Get medical help right away. Call your local emergency services (911 in the [place] Do not drive yourself to the hospital. Summary Bruising and soreness at the IV site are common. Check your insertion site every day for signs of infection. Rest as told by your doctor. Go back to your normal activities as told by your doctor. Get help right away if you have signs of a serious reaction. This information is not intended to replace advice given to you by your health care provider. Make sure you discuss any questions you have with your health care provider. Document Revised: 01/15/2021 Document Reviewed: 03/15/2019 Elsevier Patient Education  2022 Elsevier Inc.  

## 2021-07-01 DIAGNOSIS — I480 Paroxysmal atrial fibrillation: Secondary | ICD-10-CM | POA: Diagnosis not present

## 2021-07-01 DIAGNOSIS — T451X5D Adverse effect of antineoplastic and immunosuppressive drugs, subsequent encounter: Secondary | ICD-10-CM | POA: Diagnosis not present

## 2021-07-01 DIAGNOSIS — J449 Chronic obstructive pulmonary disease, unspecified: Secondary | ICD-10-CM | POA: Diagnosis not present

## 2021-07-01 DIAGNOSIS — I10 Essential (primary) hypertension: Secondary | ICD-10-CM | POA: Diagnosis not present

## 2021-07-01 DIAGNOSIS — C61 Malignant neoplasm of prostate: Secondary | ICD-10-CM | POA: Diagnosis not present

## 2021-07-01 DIAGNOSIS — I2510 Atherosclerotic heart disease of native coronary artery without angina pectoris: Secondary | ICD-10-CM | POA: Diagnosis not present

## 2021-07-01 DIAGNOSIS — D630 Anemia in neoplastic disease: Secondary | ICD-10-CM | POA: Diagnosis not present

## 2021-07-01 DIAGNOSIS — D6481 Anemia due to antineoplastic chemotherapy: Secondary | ICD-10-CM | POA: Diagnosis not present

## 2021-07-01 DIAGNOSIS — C3432 Malignant neoplasm of lower lobe, left bronchus or lung: Secondary | ICD-10-CM | POA: Diagnosis not present

## 2021-07-01 LAB — TYPE AND SCREEN
ABO/RH(D): O POS
Antibody Screen: NEGATIVE
Unit division: 0
Unit division: 0

## 2021-07-01 LAB — BPAM RBC
Blood Product Expiration Date: 202210252359
Blood Product Expiration Date: 202210252359
ISSUE DATE / TIME: 202209270739
ISSUE DATE / TIME: 202209270739
Unit Type and Rh: 5100
Unit Type and Rh: 5100

## 2021-07-03 ENCOUNTER — Telehealth: Payer: Self-pay | Admitting: Pulmonary Disease

## 2021-07-03 MED ORDER — HYDROCOD POLST-CPM POLST ER 10-8 MG/5ML PO SUER: 5.0000 mL | Freq: Two times a day (BID) | ORAL | 0 refills | Status: DC | PRN

## 2021-07-03 NOTE — Telephone Encounter (Signed)
I'm fine with filling, he has non small cell lung cancer. RX sent

## 2021-07-03 NOTE — Telephone Encounter (Signed)
Called and spoke with pt and he is aware that RX for the tussionex has been sent to the pharmacy.  Nothing further is needed.

## 2021-07-03 NOTE — Telephone Encounter (Signed)
Spoke to patient, who is requesting a refill on tussionex. Last refilled 04/17/2021 #[id number] with 0 refills.  Last OV 03/27/2021. C/o chronic cough. Cough is non productive. Denies f/c/s, wheezing, sob or additional sx. Patient is aware that Dr. Valeta Harms is unavailable until 07/06/2021. He is requesting a refill prior to, as he does not have enough medication to last through the weekend.    Beth, please advise. Thanks

## 2021-07-06 ENCOUNTER — Ambulatory Visit: Payer: Medicare PPO | Admitting: Neurology

## 2021-07-06 ENCOUNTER — Inpatient Hospital Stay: Payer: Medicare PPO

## 2021-07-06 ENCOUNTER — Inpatient Hospital Stay: Payer: Medicare PPO | Attending: Physician Assistant

## 2021-07-06 ENCOUNTER — Other Ambulatory Visit: Payer: Self-pay

## 2021-07-06 DIAGNOSIS — C3492 Malignant neoplasm of unspecified part of left bronchus or lung: Secondary | ICD-10-CM

## 2021-07-06 DIAGNOSIS — Z5112 Encounter for antineoplastic immunotherapy: Secondary | ICD-10-CM | POA: Insufficient documentation

## 2021-07-06 DIAGNOSIS — C787 Secondary malignant neoplasm of liver and intrahepatic bile duct: Secondary | ICD-10-CM | POA: Insufficient documentation

## 2021-07-06 DIAGNOSIS — D649 Anemia, unspecified: Secondary | ICD-10-CM | POA: Insufficient documentation

## 2021-07-06 DIAGNOSIS — Z5111 Encounter for antineoplastic chemotherapy: Secondary | ICD-10-CM | POA: Diagnosis not present

## 2021-07-06 DIAGNOSIS — J910 Malignant pleural effusion: Secondary | ICD-10-CM | POA: Diagnosis not present

## 2021-07-06 DIAGNOSIS — Z79899 Other long term (current) drug therapy: Secondary | ICD-10-CM | POA: Diagnosis not present

## 2021-07-06 DIAGNOSIS — Z95828 Presence of other vascular implants and grafts: Secondary | ICD-10-CM

## 2021-07-06 DIAGNOSIS — C3412 Malignant neoplasm of upper lobe, left bronchus or lung: Secondary | ICD-10-CM | POA: Insufficient documentation

## 2021-07-06 DIAGNOSIS — C778 Secondary and unspecified malignant neoplasm of lymph nodes of multiple regions: Secondary | ICD-10-CM | POA: Insufficient documentation

## 2021-07-06 DIAGNOSIS — Z23 Encounter for immunization: Secondary | ICD-10-CM | POA: Diagnosis not present

## 2021-07-06 LAB — CMP (CANCER CENTER ONLY)
ALT: 19 U/L (ref 0–44)
AST: 25 U/L (ref 15–41)
Albumin: 2.5 g/dL — ABNORMAL LOW (ref 3.5–5.0)
Alkaline Phosphatase: 62 U/L (ref 38–126)
Anion gap: 7 (ref 5–15)
BUN: 14 mg/dL (ref 8–23)
CO2: 22 mmol/L (ref 22–32)
Calcium: 9.1 mg/dL (ref 8.9–10.3)
Chloride: 105 mmol/L (ref 98–111)
Creatinine: 0.82 mg/dL (ref 0.61–1.24)
GFR, Estimated: >60 mL/min (ref >60)
Glucose, Bld: 112 mg/dL — ABNORMAL HIGH (ref 70–99)
Potassium: 4.2 mmol/L (ref 3.5–5.1)
Sodium: 134 mmol/L — ABNORMAL LOW (ref 135–145)
Total Bilirubin: 0.6 mg/dL (ref 0.3–1.2)
Total Protein: 6.6 g/dL (ref 6.5–8.1)

## 2021-07-06 LAB — CBC WITH DIFFERENTIAL (CANCER CENTER ONLY)
Abs Immature Granulocytes: 0.08 10*3/uL — ABNORMAL HIGH (ref 0.00–0.07)
Basophils Absolute: 0 10*3/uL (ref 0.0–0.1)
Basophils Relative: 0 %
Eosinophils Absolute: 0.1 10*3/uL (ref 0.0–0.5)
Eosinophils Relative: 1 %
HCT: 26.9 % — ABNORMAL LOW (ref 39.0–52.0)
Hemoglobin: 9.1 g/dL — ABNORMAL LOW (ref 13.0–17.0)
Immature Granulocytes: 2 %
Lymphocytes Relative: 5 %
Lymphs Abs: 0.2 10*3/uL — ABNORMAL LOW (ref 0.7–4.0)
MCH: 31.5 pg (ref 26.0–34.0)
MCHC: 33.8 g/dL (ref 30.0–36.0)
MCV: 93.1 fL (ref 80.0–100.0)
Monocytes Absolute: 0.8 10*3/uL (ref 0.1–1.0)
Monocytes Relative: 20 %
Neutro Abs: 3.1 10*3/uL (ref 1.7–7.7)
Neutrophils Relative %: 72 %
Platelet Count: 102 10*3/uL — ABNORMAL LOW (ref 150–400)
RBC: 2.89 MIL/uL — ABNORMAL LOW (ref 4.22–5.81)
RDW: 19.9 % — ABNORMAL HIGH (ref 11.5–15.5)
WBC Count: 4.3 10*3/uL (ref 4.0–10.5)
nRBC: 0 % (ref 0.0–0.2)

## 2021-07-06 LAB — SAMPLE TO BLOOD BANK

## 2021-07-06 MED ORDER — INFLUENZA VAC A&B SA ADJ QUAD 0.5 ML IM PRSY
0.5000 mL | PREFILLED_SYRINGE | Freq: Once | INTRAMUSCULAR | Status: AC
  Administered 2021-07-06: 0.5 mL via INTRAMUSCULAR
  Filled 2021-07-06: qty 0.5

## 2021-07-06 MED ORDER — HEPARIN SOD (PORK) LOCK FLUSH 100 UNIT/ML IV SOLN
500.0000 [IU] | Freq: Once | INTRAVENOUS | Status: AC
  Administered 2021-07-06: 500 [IU]

## 2021-07-06 MED ORDER — SODIUM CHLORIDE 0.9% FLUSH
10.0000 mL | Freq: Once | INTRAVENOUS | Status: AC
  Administered 2021-07-06: 10 mL

## 2021-07-07 DIAGNOSIS — I10 Essential (primary) hypertension: Secondary | ICD-10-CM | POA: Diagnosis not present

## 2021-07-07 DIAGNOSIS — T451X5D Adverse effect of antineoplastic and immunosuppressive drugs, subsequent encounter: Secondary | ICD-10-CM | POA: Diagnosis not present

## 2021-07-07 DIAGNOSIS — I2510 Atherosclerotic heart disease of native coronary artery without angina pectoris: Secondary | ICD-10-CM | POA: Diagnosis not present

## 2021-07-07 DIAGNOSIS — D630 Anemia in neoplastic disease: Secondary | ICD-10-CM | POA: Diagnosis not present

## 2021-07-07 DIAGNOSIS — C61 Malignant neoplasm of prostate: Secondary | ICD-10-CM | POA: Diagnosis not present

## 2021-07-07 DIAGNOSIS — D6481 Anemia due to antineoplastic chemotherapy: Secondary | ICD-10-CM | POA: Diagnosis not present

## 2021-07-07 DIAGNOSIS — I480 Paroxysmal atrial fibrillation: Secondary | ICD-10-CM | POA: Diagnosis not present

## 2021-07-07 DIAGNOSIS — J449 Chronic obstructive pulmonary disease, unspecified: Secondary | ICD-10-CM | POA: Diagnosis not present

## 2021-07-07 DIAGNOSIS — C3432 Malignant neoplasm of lower lobe, left bronchus or lung: Secondary | ICD-10-CM | POA: Diagnosis not present

## 2021-07-07 NOTE — Progress Notes (Signed)
Assessment/Plan:   1.  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  He is more slow on the left.  Patient certainly has diffuse weakness from his newer diagnosis of lung cancer and chemotherapy, but he has some rest tremor and rigidity on the left.  We talked about trying to start levodopa and see if that made a difference and patient was agreeable.  Discussed risk, benefits, and side effects.  Discussed titration schedule and this was given in written format to the patient.  -Patient is certainly deconditioned.  He is working with physical therapy.   2.  OSAS  -following with Dr. Brett Fairy  3.  Stage IV non-small cell adenocarcinoma lung  -Treatment via oncology  Subjective:   Corey [name] was seen today in follow-up . Pt with brother who supplements hx.   Much has happened since our last visit.  He was diagnosed with lung cancer, non-small cell, stage IV and has been undergoing palliative systemic chemotherapy.    ALLERGIES:   Allergies  Allergen Reactions   Amoxicillin Swelling    Tolerates Rocephin   Fosinopril Other (See Comments)   Hydromorphone Nausea Only, Other (See Comments) and Nausea And Vomiting   Ampicillin Rash   Monopril [Fosinopril Sodium] Other (See Comments)    Muscle aches & pain   Other Other (See Comments)   Rosuvastatin Other (See Comments)    Joint pain    CURRENT MEDICATIONS:  Current Outpatient Medications  Medication Instructions   albuterol (VENTOLIN HFA) 108 (90 Base) MCG/ACT inhaler 2 puffs, Inhalation, Every 4 hours PRN, Inhale 2 puffs into the lungs every 4 hours as needed   ALPRAZolam (XANAX) 0.5 mg, Oral, At bedtime PRN   atorvastatin (LIPITOR) 40 mg, Oral, Daily   benzonatate (TESSALON) 200 mg, Oral, 3 times daily PRN   Carbinoxamine Maleate 6 MG TABS 1 tablet, Oral, 2 times daily PRN   chlorpheniramine-HYDROcodone (TUSSIONEX) 10-8 MG/5ML SUER 5 mLs, Oral, Every 12 hours PRN   diclofenac Sodium (VOLTAREN) 4 g, Topical, 4 times  daily   dicyclomine (BENTYL) 20 mg, Oral, Daily PRN   diltiazem (CARDIZEM) 30 mg, Oral, 4 times daily PRN   dofetilide (TIKOSYN) 500 mcg, Oral, 2 times daily, Please keep upcoming appointment in February for future refills. Thank you   fenofibrate 160 MG tablet TAKE 1 TABLET DAILY. PLEASE KEEP UPCOMING APPT IN MARCH WITH DR. Acie Fredrickson BEFORE ANYMORE REFILLS.   fluticasone (FLONASE) 50 MCG/ACT nasal spray 2 sprays, Each Nare, Daily   folic acid (FOLVITE) 1 MG tablet TAKE 1 TABLET BY MOUTH EVERY DAY   ipratropium (ATROVENT) 0.03 % nasal spray 1-2 sprays, Each Nare, Every 12 hours, For drainage   leuprolide (6 Month) (ELIGARD) 45 mg, Subcutaneous, Every 6 months   lidocaine-prilocaine (EMLA) cream 1 application, Topical, As needed   loperamide (IMODIUM) 2 mg, Oral, Daily PRN   magnesium oxide (MAG-OX) 400 (240 Mg) MG tablet TAKE 1 TABLET BY MOUTH EVERY DAY   metoprolol succinate (TOPROL XL) 25 mg, Oral, Daily   Multiple Vitamins-Minerals (MULTIVITAMINS THER. W/MINERALS) TABS 1 tablet, Oral, Daily,     nitroGLYCERIN (NITROSTAT) 0.4 mg, Sublingual, Every 5 min PRN   omeprazole (PRILOSEC) 20 mg, Oral, Daily   ondansetron (ZOFRAN) 8 mg, Oral, Every 8 hours PRN, Starting 3 days after chemotherapy   potassium chloride (KLOR-CON) 10 MEQ tablet 10 mEq, Oral, Daily   promethazine (PHENERGAN) 25 mg, Oral, Every 6 hours PRN   Saccharomyces boulardii (PROBIOTIC) 250 MG CAPS 1 capsule, Oral,  Daily   solifenacin (VESICARE) 5 mg, Oral, Daily, Taken at bedtime   sucralfate (CARAFATE) 1 g, Oral, 4 times daily, Before meals   tamsulosin (FLOMAX) 0.4 mg, Oral, Daily after supper,     Tiotropium Bromide-Olodaterol (STIOLTO RESPIMAT) 2.5-2.5 MCG/ACT AERS 2 puffs, Inhalation, Daily   topiramate (TOPAMAX) 100 mg, Oral, 2 times daily   traMADol (ULTRAM) 50 mg, Oral, Every 6 hours PRN   traZODone (DESYREL) 25 mg, Oral, At bedtime PRN   Tylenol 8 Hour Arthritis Pain 1,300 mg, Oral, Every 8 hours PRN   Vitamin D,  Ergocalciferol, (DRISDOL) 1.25 MG (50000 UNIT) CAPS capsule TAKE 1 CAPSULE BY MOUTH ONE TIME PER WEEK   XARELTO 20 MG TABS tablet TAKE 1 TABLET BY MOUTH EVERY DAY    Objective:   PHYSICAL EXAMINATION:    VITALS:   Vitals:   07/09/21 1429  BP: 122/62  Pulse: 89  SpO2: 98%  Weight: 234 lb (106.1 kg)  Height: 6\' 1"  (1.854 m)   Wt Readings from Last 3 Encounters:  07/09/21 234 lb (106.1 kg)  06/22/21 235 lb 2 oz (106.7 kg)  06/05/21 238 lb 1.6 oz (108 kg)     GEN:  The patient appears stated age and is in NAD.  He is pale.  He is tearful when he tells his story of his diagnosis of cancer. HEENT:  Normocephalic, atraumatic.  The mucous membranes are moist.   Neurological examination:  Orientation: The patient is alert and oriented x3.  Cranial nerves: There is good facial symmetry.  Extraocular muscles are intact. The visual fields are full to confrontational testing. The speech is fluent and clear. Soft palate rises symmetrically and there is no tongue deviation. Hearing is intact to conversational tone. Sensation: Sensation is intact to light touch throughout (facial, trunk, extremities). Vibration is intact at the bilateral big toe. There is no extinction with double simultaneous stimulation.  Motor: Strength is 5/5 in the bilateral upper and lower extremities.   Shoulder shrug is equal and symmetric.  There is no pronator drift.   Movement examination: Tone: There is nl tone in the bilateral upper extremities.  The tone in the lower extremities is nl.  Abnormal movements:there is bilateral upper extremity rest tremor, left greater than right Coordination:  There is mild decremation on the left Gait and Station: The patient pushes off to arise.  He has a Radiation protection practitioner.  He slightly drags the left leg I have reviewed and interpreted the following labs independently   Chemistry      Component Value Date/Time   NA 134 (L) 07/06/2021 1042   NA 137 01/21/2021 1553   K 4.2 07/06/2021  1042   CL 105 07/06/2021 1042   CO2 22 07/06/2021 1042   BUN 14 07/06/2021 1042   BUN 17 01/21/2021 1553   CREATININE 0.82 07/06/2021 1042   CREATININE 1.04 08/30/2016 0840      Component Value Date/Time   CALCIUM 9.1 07/06/2021 1042   ALKPHOS 62 07/06/2021 1042   AST 25 07/06/2021 1042   ALT 19 07/06/2021 1042   BILITOT 0.6 07/06/2021 1042      Lab Results  Component Value Date   TSH 1.315 06/29/2021   Lab Results  Component Value Date   WBC 4.3 07/06/2021   HGB 9.1 (L) 07/06/2021   HCT 26.9 (L) 07/06/2021   MCV 93.1 07/06/2021   PLT 102 (L) 07/06/2021    Lab Results  Component Value Date   VITAMINB12 582 09/22/2020  Lab Results  Component Value Date   IRON 70 06/22/2021   TIBC 270 06/22/2021   FERRITIN 1,134 (H) 06/22/2021     Total time spent on today's visit was  53 minutes, including both face-to-face time and nonface-to-face time.  Time included that spent on review of records (prior notes available to me/labs/imaging if pertinent), discussing treatment and goals, answering patient's questions and coordinating care.  Cc:  [name], MD

## 2021-07-09 ENCOUNTER — Other Ambulatory Visit: Payer: Self-pay

## 2021-07-09 ENCOUNTER — Other Ambulatory Visit: Payer: Self-pay | Admitting: Internal Medicine

## 2021-07-09 ENCOUNTER — Ambulatory Visit (INDEPENDENT_AMBULATORY_CARE_PROVIDER_SITE_OTHER): Payer: Medicare PPO | Admitting: Neurology

## 2021-07-09 ENCOUNTER — Encounter: Payer: Self-pay | Admitting: Neurology

## 2021-07-09 VITALS — BP 122/62 | HR 89 | Ht 73.0 in | Wt 234.0 lb

## 2021-07-09 DIAGNOSIS — C3432 Malignant neoplasm of lower lobe, left bronchus or lung: Secondary | ICD-10-CM | POA: Diagnosis not present

## 2021-07-09 DIAGNOSIS — J449 Chronic obstructive pulmonary disease, unspecified: Secondary | ICD-10-CM | POA: Diagnosis not present

## 2021-07-09 DIAGNOSIS — I10 Essential (primary) hypertension: Secondary | ICD-10-CM | POA: Diagnosis not present

## 2021-07-09 DIAGNOSIS — T451X5D Adverse effect of antineoplastic and immunosuppressive drugs, subsequent encounter: Secondary | ICD-10-CM | POA: Diagnosis not present

## 2021-07-09 DIAGNOSIS — C61 Malignant neoplasm of prostate: Secondary | ICD-10-CM | POA: Diagnosis not present

## 2021-07-09 DIAGNOSIS — I480 Paroxysmal atrial fibrillation: Secondary | ICD-10-CM | POA: Diagnosis not present

## 2021-07-09 DIAGNOSIS — G20 Parkinson's disease: Secondary | ICD-10-CM

## 2021-07-09 DIAGNOSIS — I2510 Atherosclerotic heart disease of native coronary artery without angina pectoris: Secondary | ICD-10-CM | POA: Diagnosis not present

## 2021-07-09 DIAGNOSIS — C3492 Malignant neoplasm of unspecified part of left bronchus or lung: Secondary | ICD-10-CM | POA: Diagnosis not present

## 2021-07-09 DIAGNOSIS — D630 Anemia in neoplastic disease: Secondary | ICD-10-CM | POA: Diagnosis not present

## 2021-07-09 DIAGNOSIS — D6481 Anemia due to antineoplastic chemotherapy: Secondary | ICD-10-CM | POA: Diagnosis not present

## 2021-07-09 LAB — CUP PACEART REMOTE DEVICE CHECK
Date Time Interrogation Session: 20221003101623
Implantable Pulse Generator Implant Date: 20210526

## 2021-07-09 MED ORDER — CARBIDOPA-LEVODOPA 25-100 MG PO TABS: 1.0000 | ORAL_TABLET | Freq: Three times a day (TID) | ORAL | 1 refills | Status: DC

## 2021-07-09 NOTE — Patient Instructions (Addendum)
Start Carbidopa Levodopa as follows: Take 1/2 tablet three times daily, at least 30 minutes before meals (approximately 8am/noon/4pm), for one week Then take 1/2 tablet in the morning, 1/2 tablet in the afternoon, 1 tablet in the evening, at least 30 minutes before meals, for one week Then take 1/2 tablet in the morning, 1 tablet in the afternoon, 1 tablet in the evening, at least 30 minutes before meals, for one week Then take 1 tablet three times daily at 8am/noon/4pm), at least 30 minutes before meals   As a reminder, carbidopa/levodopa can be taken at the same time as a carbohydrate, but we like to have you take your pill either 30 minutes before a protein source or 1 hour after as protein can interfere with carbidopa/levodopa absorption.  The physicians and staff at [facility] are committed to providing excellent care. You may receive a survey requesting feedback about your experience at our office. We strive to receive "very good" responses to the survey questions. If you feel that your experience would prevent you from giving the office a "very good " response, please contact our office to try to remedy the situation. We may be reached at [phone]. Thank you for taking the time out of your busy day to complete the survey.

## 2021-07-11 ENCOUNTER — Other Ambulatory Visit: Payer: Self-pay | Admitting: Internal Medicine

## 2021-07-13 ENCOUNTER — Ambulatory Visit (INDEPENDENT_AMBULATORY_CARE_PROVIDER_SITE_OTHER): Payer: Medicare PPO

## 2021-07-13 DIAGNOSIS — R55 Syncope and collapse: Secondary | ICD-10-CM

## 2021-07-14 ENCOUNTER — Inpatient Hospital Stay (HOSPITAL_BASED_OUTPATIENT_CLINIC_OR_DEPARTMENT_OTHER): Payer: Medicare PPO | Admitting: Internal Medicine

## 2021-07-14 ENCOUNTER — Other Ambulatory Visit: Payer: Self-pay

## 2021-07-14 ENCOUNTER — Inpatient Hospital Stay: Payer: Medicare PPO

## 2021-07-14 ENCOUNTER — Other Ambulatory Visit: Payer: Self-pay | Admitting: Physician Assistant

## 2021-07-14 VITALS — BP 101/58 | HR 96 | Temp 97.0°F | Resp 20 | Ht 73.0 in | Wt 234.8 lb

## 2021-07-14 DIAGNOSIS — C778 Secondary and unspecified malignant neoplasm of lymph nodes of multiple regions: Secondary | ICD-10-CM | POA: Diagnosis not present

## 2021-07-14 DIAGNOSIS — C3432 Malignant neoplasm of lower lobe, left bronchus or lung: Secondary | ICD-10-CM | POA: Diagnosis not present

## 2021-07-14 DIAGNOSIS — Z5111 Encounter for antineoplastic chemotherapy: Secondary | ICD-10-CM

## 2021-07-14 DIAGNOSIS — E611 Iron deficiency: Secondary | ICD-10-CM

## 2021-07-14 DIAGNOSIS — D649 Anemia, unspecified: Secondary | ICD-10-CM | POA: Diagnosis not present

## 2021-07-14 DIAGNOSIS — Z79899 Other long term (current) drug therapy: Secondary | ICD-10-CM | POA: Diagnosis not present

## 2021-07-14 DIAGNOSIS — Z95828 Presence of other vascular implants and grafts: Secondary | ICD-10-CM

## 2021-07-14 DIAGNOSIS — C3492 Malignant neoplasm of unspecified part of left bronchus or lung: Secondary | ICD-10-CM

## 2021-07-14 DIAGNOSIS — Z5112 Encounter for antineoplastic immunotherapy: Secondary | ICD-10-CM | POA: Diagnosis not present

## 2021-07-14 DIAGNOSIS — C787 Secondary malignant neoplasm of liver and intrahepatic bile duct: Secondary | ICD-10-CM | POA: Diagnosis not present

## 2021-07-14 DIAGNOSIS — Z23 Encounter for immunization: Secondary | ICD-10-CM | POA: Diagnosis not present

## 2021-07-14 DIAGNOSIS — C3412 Malignant neoplasm of upper lobe, left bronchus or lung: Secondary | ICD-10-CM | POA: Diagnosis not present

## 2021-07-14 DIAGNOSIS — J910 Malignant pleural effusion: Secondary | ICD-10-CM | POA: Diagnosis not present

## 2021-07-14 DIAGNOSIS — C3490 Malignant neoplasm of unspecified part of unspecified bronchus or lung: Secondary | ICD-10-CM

## 2021-07-14 LAB — CBC WITH DIFFERENTIAL (CANCER CENTER ONLY)
Abs Immature Granulocytes: 0.05 10*3/uL (ref 0.00–0.07)
Basophils Absolute: 0 10*3/uL (ref 0.0–0.1)
Basophils Relative: 1 %
Eosinophils Absolute: 0.1 10*3/uL (ref 0.0–0.5)
Eosinophils Relative: 3 %
HCT: 26.9 % — ABNORMAL LOW (ref 39.0–52.0)
Hemoglobin: 8.6 g/dL — ABNORMAL LOW (ref 13.0–17.0)
Immature Granulocytes: 1 %
Lymphocytes Relative: 6 %
Lymphs Abs: 0.3 10*3/uL — ABNORMAL LOW (ref 0.7–4.0)
MCH: 31 pg (ref 26.0–34.0)
MCHC: 32 g/dL (ref 30.0–36.0)
MCV: 97.1 fL (ref 80.0–100.0)
Monocytes Absolute: 0.7 10*3/uL (ref 0.1–1.0)
Monocytes Relative: 16 %
Neutro Abs: 3 10*3/uL (ref 1.7–7.7)
Neutrophils Relative %: 73 %
Platelet Count: 251 10*3/uL (ref 150–400)
RBC: 2.77 MIL/uL — ABNORMAL LOW (ref 4.22–5.81)
RDW: 21.5 % — ABNORMAL HIGH (ref 11.5–15.5)
WBC Count: 4.1 10*3/uL (ref 4.0–10.5)
nRBC: 0 % (ref 0.0–0.2)

## 2021-07-14 LAB — CMP (CANCER CENTER ONLY)
ALT: 13 U/L (ref 0–44)
AST: 22 U/L (ref 15–41)
Albumin: 2.6 g/dL — ABNORMAL LOW (ref 3.5–5.0)
Alkaline Phosphatase: 56 U/L (ref 38–126)
Anion gap: 7 (ref 5–15)
BUN: 11 mg/dL (ref 8–23)
CO2: 22 mmol/L (ref 22–32)
Calcium: 8.9 mg/dL (ref 8.9–10.3)
Chloride: 107 mmol/L (ref 98–111)
Creatinine: 0.81 mg/dL (ref 0.61–1.24)
GFR, Estimated: >60 mL/min (ref >60)
Glucose, Bld: 107 mg/dL — ABNORMAL HIGH (ref 70–99)
Potassium: 3.8 mmol/L (ref 3.5–5.1)
Sodium: 136 mmol/L (ref 135–145)
Total Bilirubin: 0.4 mg/dL (ref 0.3–1.2)
Total Protein: 6.7 g/dL (ref 6.5–8.1)

## 2021-07-14 LAB — TSH: TSH: 1.455 u[IU]/mL (ref 0.320–4.118)

## 2021-07-14 MED ORDER — CYANOCOBALAMIN 1000 MCG/ML IJ SOLN
1000.0000 ug | Freq: Once | INTRAMUSCULAR | Status: AC
  Administered 2021-07-14: 1000 ug via INTRAMUSCULAR
  Filled 2021-07-14: qty 1

## 2021-07-14 MED ORDER — SODIUM CHLORIDE 0.9 % IV SOLN
400.0000 mg/m2 | Freq: Once | INTRAVENOUS | Status: AC
  Administered 2021-07-14: 900 mg via INTRAVENOUS
  Filled 2021-07-14: qty 20

## 2021-07-14 MED ORDER — SODIUM CHLORIDE 0.9 % IV SOLN
200.0000 mg | Freq: Once | INTRAVENOUS | Status: AC
  Administered 2021-07-14: 200 mg via INTRAVENOUS
  Filled 2021-07-14: qty 8

## 2021-07-14 MED ORDER — PROCHLORPERAZINE MALEATE 10 MG PO TABS
10.0000 mg | ORAL_TABLET | Freq: Once | ORAL | Status: AC
  Administered 2021-07-14: 10 mg via ORAL
  Filled 2021-07-14: qty 1

## 2021-07-14 MED ORDER — SODIUM CHLORIDE 0.9% FLUSH
10.0000 mL | Freq: Once | INTRAVENOUS | Status: AC
Start: 2021-07-14 — End: 2021-07-14
  Administered 2021-07-14: 10 mL

## 2021-07-14 MED ORDER — SODIUM CHLORIDE 0.9 % IV SOLN: Freq: Once | INTRAVENOUS | Status: AC

## 2021-07-14 NOTE — Progress Notes (Signed)
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     Molecular Studies by [employer]: [id number] 0.5%   Olaparib Yes [id number] 50.9%   Olaparib Yes [id number] 0.8% None      PDL1: 30%   PRIOR THERAPY: 1) Palliative radiotherapy to the obstructive lung mass in the left lung under the care of Dr. MTammi Klippel Last treatment 03/23/21   CURRENT THERAPY: Palliative systemic chemotherapy with carboplatin AUC 5, Alimta 500 mg per metered squared, Keytruda 200 mg IV every 3 weeks.  First dose expected on 03/31/21. Status post 5 cycles.  Starting from cycle #5 the patient is on maintenance treatment with Alimta 500 Mg/M2 and Keytruda 200 Mg IV every 3 weeks.  INTERVAL HISTORY: Corey CORCORAN89y.o. male returns to the clinic today for follow-up visit accompanied by his wife.  The patient is feeling fine today with no concerning complaints except for the persistent fatigue secondary to anemia probably related to the radiotherapy to the prostate gland in 2021 in addition to the recent chemotherapy.  The patient continues to have shortness of breath with exertion but no significant chest pain, cough or hemoptysis.  He has no nausea, vomiting, diarrhea or constipation.  He has no headache or visual changes.  He is here today for evaluation before starting cycle #6 of his treatment.   MEDICAL HISTORY: Past Medical History:  Diagnosis Date   Anemia    Anxiety    Arthritis    "back, right knee, hands, ankles, neck" (05/31/2016)   Carotid artery disease ([facility]  08/30/2016   Carotid [place]: R 1-39, L 40-59 // Carotid [place]/2020: R 1-39; L 40-59 // Carotid [place]21: R 1-39; L 40-59>> repeat 1 year    Chronic lower back pain    Coronary atherosclerosis of native coronary artery    [facility], otherwise mild nonobstructive disease. EF normal.   Diverticulitis    Dyslipidemia    Dyspnea    Essential hypertension, benign    Family history of breast cancer    Family history of lung cancer    Family history of prostate cancer    GERD (gastroesophageal reflux disease)    Lumbar radiculopathy, chronic 02/04/2015   Right L5   Obesity    OSA on CPAP    uses cpap   Paroxysmal atrial fibrillation ([facility]    a. Discovered after stroke.   Pneumonia 01/1996   PONV (postoperative nausea and vomiting)    Prostate CA [place]    Recurrent upper respiratory infection (URI)    [place] 07[place]   no deficits   Visit for monitoring Tikosyn therapy 09/18/2019    ALLERGIES:  is allergic to amoxicillin, fosinopril, hydromorphone, ampicillin, monopril [fosinopril sodium], other, and rosuvastatin.  MEDICATIONS:  Current Outpatient Medications  Medication Sig Dispense Refill   acetaminophen (TYLENOL 8 HOUR ARTHRITIS PAIN) 650 MG CR tablet Take 1,300 mg by mouth every 8 (eight) hours as needed for pain (pain).     albuterol (VENTOLIN HFA) 108 (90 Base) MCG/ACT inhaler Inhale 2 puffs into the lungs every 4 (four)  hours as needed for wheezing or shortness of breath. Inhale 2 puffs into the lungs every 4 hours as needed 18 g 0   ALPRAZolam (XANAX) 0.5 MG tablet Take 1 tablet (0.5 mg total) by mouth at bedtime as needed for anxiety. 10 tablet 0   atorvastatin (LIPITOR) 40 MG tablet Take 1 tablet (40 mg total) by mouth daily. 90 tablet 3   benzonatate (TESSALON) 200 MG capsule TAKE 1 CAPSULE (200 MG TOTAL) BY MOUTH 3 (THREE) TIMES DAILY AS NEEDED FOR COUGH. 60 capsule 1   carbidopa-levodopa (SINEMET IR) 25-100 MG tablet Take 1 tablet by mouth 3  (three) times daily. 270 tablet 1   Carbinoxamine Maleate 6 MG TABS Take 1 tablet by mouth 2 (two) times daily as needed (drainage). 60 tablet 4   chlorpheniramine-HYDROcodone (TUSSIONEX) 10-8 MG/5ML SUER Take 5 mLs by mouth every 12 (twelve) hours as needed for cough. 473 mL 0   diclofenac Sodium (VOLTAREN) 1 % GEL Apply 4 g topically 4 (four) times daily. 100 g 0   dicyclomine (BENTYL) 20 MG tablet Take 20 mg by mouth daily as needed for spasms.     diltiazem (CARDIZEM) 30 MG tablet Take 1 tablet (30 mg total) by mouth 4 (four) times daily as needed. 60 tablet 6   dofetilide (TIKOSYN) 500 MCG capsule Take 1 capsule (500 mcg total) by mouth 2 (two) times daily. Please keep upcoming appointment in February for future refills. Thank you 180 capsule 3   fenofibrate 160 MG tablet TAKE 1 TABLET DAILY. PLEASE KEEP UPCOMING APPT IN MARCH WITH DR. Acie Fredrickson BEFORE ANYMORE REFILLS. (Patient taking differently: Take 160 mg by mouth daily.) 90 tablet 3   fluticasone (FLONASE) 50 MCG/ACT nasal spray Place 2 sprays into both nostrils daily. 23.5 mL 5   folic acid (FOLVITE) 1 MG tablet TAKE 1 TABLET BY MOUTH EVERY DAY 90 tablet 1   ipratropium (ATROVENT) 0.03 % nasal spray Place 1-2 sprays into both nostrils every 12 (twelve) hours. For drainage 30 mL 5   leuprolide, 6 Month, (ELIGARD) 45 MG injection Inject 45 mg into the skin every 6 (six) months.     lidocaine-prilocaine (EMLA) cream Apply 1 application topically as needed. 30 g 2   loperamide (IMODIUM) 2 MG capsule Take 2 mg by mouth daily as needed for diarrhea or loose stools.     magnesium oxide (MAG-OX) 400 (240 Mg) MG tablet TAKE 1 TABLET BY MOUTH EVERY DAY 90 tablet 2   metoprolol succinate (TOPROL-XL) 25 MG 24 hr tablet TAKE 1 TABLET BY MOUTH EVERY DAY 90 tablet 1   Multiple Vitamins-Minerals (MULTIVITAMINS THER. W/MINERALS) TABS Take 1 tablet by mouth daily.     nitroGLYCERIN (NITROSTAT) 0.4 MG SL tablet Place 1 tablet (0.4 mg total) under the tongue  every 5 (five) minutes as needed for chest pain. 25 tablet 5   omeprazole (PRILOSEC) 20 MG capsule Take 1 capsule (20 mg total) by mouth daily. 30 capsule 5   ondansetron (ZOFRAN) 8 MG tablet Take 1 tablet (8 mg total) by mouth every 8 (eight) hours as needed for nausea or vomiting. Starting 3 days after chemotherapy 30 tablet 2   potassium chloride (KLOR-CON) 10 MEQ tablet Take 1 tablet (10 mEq total) by mouth daily. 90 tablet 2   promethazine (PHENERGAN) 25 MG tablet Take 1 tablet (25 mg total) by mouth every 6 (six) hours as needed for nausea or vomiting. 30 tablet 1   Saccharomyces boulardii (PROBIOTIC) 250 MG CAPS Take 1 capsule  by mouth daily.     solifenacin (VESICARE) 5 MG tablet Take 5 mg by mouth daily. Taken at bedtime     sucralfate (CARAFATE) 1 GM/10ML suspension Take 10 mLs (1 g total) by mouth 4 (four) times daily. Before meals 420 mL 1   tamsulosin (FLOMAX) 0.4 MG CAPS capsule Take 0.4 mg by mouth daily after supper.     Tiotropium Bromide-Olodaterol (STIOLTO RESPIMAT) 2.5-2.5 MCG/ACT AERS Inhale 2 puffs into the lungs daily. 4 g 5   topiramate (TOPAMAX) 50 MG tablet Take 2 tablets (100 mg total) by mouth 2 (two) times daily. 360 tablet 3   traMADol (ULTRAM) 50 MG tablet Take 1 tablet (50 mg total) by mouth every 6 (six) hours as needed for moderate pain. 30 tablet 0   traZODone (DESYREL) 50 MG tablet Take 0.5 tablets (25 mg total) by mouth at bedtime as needed for sleep. 45 tablet 3   Vitamin D, Ergocalciferol, (DRISDOL) 1.25 MG (50000 UNIT) CAPS capsule TAKE 1 CAPSULE BY MOUTH ONE TIME PER WEEK (Patient taking differently: Take 50,000 Units by mouth every 7 (seven) days. Monday) 12 capsule 1   XARELTO 20 MG TABS tablet TAKE 1 TABLET BY MOUTH EVERY DAY (Patient taking differently: 20 mg daily with supper.) 90 tablet 2   No current facility-administered medications for this visit.    SURGICAL HISTORY:  Past Surgical History:  Procedure Laterality Date   ADENOIDECTOMY      ANTERIOR CERVICAL DECOMP/DISCECTOMY FUSION  07/2001; 10/2002   "C5-6; C6-7; redo"   BACK SURGERY     CARPAL TUNNEL RELEASE Left 10/2015   CHEST TUBE INSERTION Left 03/17/2021   Procedure: INSERTION PLEURAL DRAINAGE CATHETER;  Surgeon: Garner Nash, DO;  Location: [facility];  Service: Pulmonary;  Laterality: Left;  Indwelling Tunneled Pleural catheter (PLEUREX)    COLONOSCOPY W/ POLYPECTOMY  02/2014   CORONARY ANGIOPLASTY WITH STENT PLACEMENT  05/2003; 12/2005   "mid RCA; mid RCA"   FINE NEEDLE ASPIRATION  02/26/2021   Procedure: FINE NEEDLE ASPIRATION (FNA) LINEAR;  Surgeon: Candee Furbish, MD;  Location: [facility] ENDOSCOPY;  Service: Pulmonary;;   HAND SURGERY  02/2019   LEFT HAND   implantable loop recorder placement  02/27/2020   [employer] Reveal [place] model [id number]  implantable loop recorder implanted by Dr Rayann Heman for afib management and evaluation of presyncope   IR IMAGING GUIDED PORT INSERTION  04/16/2021   JOINT REPLACEMENT     KNEE ARTHROSCOPY Left 10/2005   KNEE ARTHROSCOPY W/ PARTIAL MEDIAL MENISCECTOMY Left 09/2005   LUMBAR LAMINECTOMY/DECOMPRESSION MICRODISCECTOMY  03/2005   "L4-5"   POSTERIOR LUMBAR FUSION  10/2003   L5-S1; "plates, screws"   SHOULDER ARTHROSCOPY Right 08/2011   Debridement of labrum, arthroscopic distal clavicle excision   SHOULDER OPEN ROTATOR CUFF REPAIR Left 07/2014   TEE WITHOUT CARDIOVERSION  07/07/2012   Procedure: TRANSESOPHAGEAL ECHOCARDIOGRAM (TEE);  Surgeon: Fay Records, MD;  Location: [facility] ENDOSCOPY;  Service: Cardiovascular;  Laterality: N/A;   THORACENTESIS N/A 02/25/2021   Procedure: Mathews Robinsons;  Surgeon: Juanito Doom, MD;  Location: [facility];  Service: Cardiopulmonary;  Laterality: N/A;   TONSILLECTOMY AND ADENOIDECTOMY  ~ 1956   TOTAL KNEE ARTHROPLASTY Left 10/2006   TRIGGER FINGER RELEASE Left 10/2015   VIDEO BRONCHOSCOPY WITH ENDOBRONCHIAL ULTRASOUND Left 02/26/2021   Procedure: VIDEO BRONCHOSCOPY WITH ENDOBRONCHIAL  ULTRASOUND;  Surgeon: Candee Furbish, MD;  [facility];  Service: Pulmonary;  Laterality: Left;  cryoprobe too thanks!    REVIEW OF  SYSTEMS:  A comprehensive review of systems was negative except for: Constitutional: positive for fatigue Respiratory: positive for dyspnea on exertion   PHYSICAL EXAMINATION: General appearance: alert, cooperative, fatigued, and no distress Head: Normocephalic, without obvious abnormality, atraumatic Neck: no adenopathy, no JVD, supple, symmetrical, trachea midline, and thyroid not enlarged, symmetric, no tenderness/mass/nodules Lymph nodes: Cervical, supraclavicular, and axillary nodes normal. Resp: clear to auscultation bilaterally Back: symmetric, no curvature. ROM normal. No CVA tenderness. Cardio: regular rate and rhythm, S1, S2 normal, no murmur, click, rub or gallop GI: soft, non-tender; bowel sounds normal; no masses,  no organomegaly Extremities: extremities normal, atraumatic, no cyanosis or edema  ECOG PERFORMANCE STATUS: 1 - Symptomatic but completely ambulatory  Blood pressure (!) 101/58, pulse 96, temperature (!) 97 F (36.1 C), temperature source Tympanic, resp. rate 20, height _0  (1.854 m), weight 234 lb 12.8 oz (106.5 kg), SpO2 98 %.  LABORATORY DATA: Lab Results  Component Value Date   WBC 4.1 07/14/2021   HGB 8.6 (L) 07/14/2021   HCT 26.9 (L) 07/14/2021   MCV 97.1 07/14/2021   PLT 251 07/14/2021      Chemistry      Component Value Date/Time   NA 134 (L) 07/06/2021 1042   NA 137 01/21/2021 1553   K 4.2 07/06/2021 1042   CL 105 07/06/2021 1042   CO2 22 07/06/2021 1042   BUN 14 07/06/2021 1042   BUN 17 01/21/2021 1553   CREATININE 0.82 07/06/2021 1042   CREATININE 1.04 08/30/2016 0840      Component Value Date/Time   CALCIUM 9.1 07/06/2021 1042   ALKPHOS 62 07/06/2021 1042   AST 25 07/06/2021 1042   ALT 19 07/06/2021 1042   BILITOT 0.6 07/06/2021 1042       RADIOGRAPHIC STUDIES: CUP PACEART REMOTE  DEVICE CHECK  Result Date: 07/09/2021 ILR summary report received. Battery status OK. Normal device function. No new symptom, tachy, brady, or pause episodes. No new AF episodes. Monthly summary reports and ROV/PRN Kathy Breach, RN, CCDS, [employer]   ASSESSMENT AND PLAN: This is a very pleasant 75 years old white male recently diagnosed with stage IV (T1b, N2, M1c) non-small cell lung cancer favoring adenocarcinoma presented with left lung mass in addition to left hilar adenopathy and malignant left pleural effusion, intrathoracic and upper abdominal lymph nodes, left pleural/subpleural nodularity and hepatic lesions diagnosed in May 2022. He has no actionable mutation and PD-L1 expression of 30%. He is status post palliative radiotherapy to the obstructive lung mass in the left lung under the care of Dr. Tammi Klippel.  The patient is currently undergoing systemic chemotherapy with carboplatin initially for AUC of 5, Alimta 500 Mg/M2 and Keytruda 200 Mg IV every 3 weeks status post 5 cycles.  Starting from cycle #2 the patient is on treatment with carboplatin for AUC of 4 and Alimta 400 Mg/M2.  Starting from cycle #5 the patient is on maintenance treatment with Alimta and Keytruda every 3 weeks. He tolerated the last cycle of his treatment well except for the fatigue from the persistent anemia secondary to previous radiotherapy as well as chemotherapy effect. I recommended for the patient to proceed with cycle #6 today as planned. I will see him back for follow-up visit in 3 weeks for evaluation with repeat CT scan of the chest for restaging of his disease. For the anemia, will continue to monitor his hemoglobin and hematocrit closely and consider The patient for transfusion if his hemoglobin is less than 8. For  the cough, I will give him refill of Tessalon Perles. For the constipation he was advised to use MiraLAX in addition to the stool softener. The patient was advised to call immediately if  he has any other concerning symptoms in the interval. The patient voices understanding of current disease status and treatment options and is in agreement with the current care plan.  All questions were answered. The patient knows to call the clinic with any problems, questions or concerns. We can certainly see the patient much sooner if necessary.   Disclaimer: This note was dictated with voice recognition software. Similar sounding words can inadvertently be transcribed and may not be corrected upon review.

## 2021-07-15 ENCOUNTER — Telehealth: Payer: Self-pay | Admitting: Internal Medicine

## 2021-07-15 NOTE — Telephone Encounter (Signed)
Scheduled follow-up appointment per 10/11 los. Patient is aware.

## 2021-07-16 ENCOUNTER — Other Ambulatory Visit: Payer: Self-pay | Admitting: Cardiovascular Disease

## 2021-07-17 DIAGNOSIS — G4733 Obstructive sleep apnea (adult) (pediatric): Secondary | ICD-10-CM | POA: Diagnosis not present

## 2021-07-17 DIAGNOSIS — J441 Chronic obstructive pulmonary disease with (acute) exacerbation: Secondary | ICD-10-CM | POA: Diagnosis not present

## 2021-07-18 ENCOUNTER — Other Ambulatory Visit: Payer: Self-pay

## 2021-07-18 ENCOUNTER — Encounter (HOSPITAL_COMMUNITY): Payer: Self-pay

## 2021-07-18 ENCOUNTER — Emergency Department (HOSPITAL_COMMUNITY): Payer: Medicare PPO

## 2021-07-18 ENCOUNTER — Emergency Department (HOSPITAL_COMMUNITY)
Admission: EM | Admit: 2021-07-18 | Discharge: 2021-07-18 | Disposition: A | Discharge status: Home / Self Care | Payer: Medicare PPO | Attending: Emergency Medicine | Admitting: Emergency Medicine

## 2021-07-18 DIAGNOSIS — Z96652 Presence of left artificial knee joint: Secondary | ICD-10-CM | POA: Insufficient documentation

## 2021-07-18 DIAGNOSIS — Z7901 Long term (current) use of anticoagulants: Secondary | ICD-10-CM | POA: Insufficient documentation

## 2021-07-18 DIAGNOSIS — J181 Lobar pneumonia, unspecified organism: Secondary | ICD-10-CM | POA: Insufficient documentation

## 2021-07-18 DIAGNOSIS — Z87891 Personal history of nicotine dependence: Secondary | ICD-10-CM | POA: Diagnosis not present

## 2021-07-18 DIAGNOSIS — Z85118 Personal history of other malignant neoplasm of bronchus and lung: Secondary | ICD-10-CM | POA: Insufficient documentation

## 2021-07-18 DIAGNOSIS — Z7951 Long term (current) use of inhaled steroids: Secondary | ICD-10-CM | POA: Diagnosis not present

## 2021-07-18 DIAGNOSIS — Z8546 Personal history of malignant neoplasm of prostate: Secondary | ICD-10-CM | POA: Insufficient documentation

## 2021-07-18 DIAGNOSIS — I4891 Unspecified atrial fibrillation: Secondary | ICD-10-CM | POA: Insufficient documentation

## 2021-07-18 DIAGNOSIS — Z20822 Contact with and (suspected) exposure to covid-19: Secondary | ICD-10-CM | POA: Insufficient documentation

## 2021-07-18 DIAGNOSIS — J189 Pneumonia, unspecified organism: Secondary | ICD-10-CM

## 2021-07-18 DIAGNOSIS — I10 Essential (primary) hypertension: Secondary | ICD-10-CM | POA: Insufficient documentation

## 2021-07-18 DIAGNOSIS — I2510 Atherosclerotic heart disease of native coronary artery without angina pectoris: Secondary | ICD-10-CM | POA: Insufficient documentation

## 2021-07-18 DIAGNOSIS — J45909 Unspecified asthma, uncomplicated: Secondary | ICD-10-CM | POA: Insufficient documentation

## 2021-07-18 DIAGNOSIS — I517 Cardiomegaly: Secondary | ICD-10-CM | POA: Diagnosis not present

## 2021-07-18 DIAGNOSIS — Z955 Presence of coronary angioplasty implant and graft: Secondary | ICD-10-CM | POA: Diagnosis not present

## 2021-07-18 DIAGNOSIS — E871 Hypo-osmolality and hyponatremia: Secondary | ICD-10-CM | POA: Insufficient documentation

## 2021-07-18 DIAGNOSIS — J441 Chronic obstructive pulmonary disease with (acute) exacerbation: Secondary | ICD-10-CM | POA: Insufficient documentation

## 2021-07-18 DIAGNOSIS — D649 Anemia, unspecified: Secondary | ICD-10-CM | POA: Insufficient documentation

## 2021-07-18 DIAGNOSIS — R059 Cough, unspecified: Secondary | ICD-10-CM | POA: Diagnosis not present

## 2021-07-18 DIAGNOSIS — Z79899 Other long term (current) drug therapy: Secondary | ICD-10-CM | POA: Diagnosis not present

## 2021-07-18 LAB — URINALYSIS, ROUTINE W REFLEX MICROSCOPIC
Bilirubin Urine: NEGATIVE
Glucose, UA: NEGATIVE mg/dL
Hgb urine dipstick: NEGATIVE
Ketones, ur: NEGATIVE mg/dL
Leukocytes,Ua: NEGATIVE
Nitrite: NEGATIVE
Protein, ur: NEGATIVE mg/dL
Specific Gravity, Urine: 1.005 (ref 1.005–1.030)
pH: 7 (ref 5.0–8.0)

## 2021-07-18 LAB — CBC WITH DIFFERENTIAL/PLATELET
Abs Immature Granulocytes: 0.04 10*3/uL (ref 0.00–0.07)
Basophils Absolute: 0 10*3/uL (ref 0.0–0.1)
Basophils Relative: 0 %
Eosinophils Absolute: 0.1 10*3/uL (ref 0.0–0.5)
Eosinophils Relative: 2 %
HCT: 23.8 % — ABNORMAL LOW (ref 39.0–52.0)
Hemoglobin: 7.7 g/dL — ABNORMAL LOW (ref 13.0–17.0)
Immature Granulocytes: 1 %
Lymphocytes Relative: 4 %
Lymphs Abs: 0.2 10*3/uL — ABNORMAL LOW (ref 0.7–4.0)
MCH: 31.7 pg (ref 26.0–34.0)
MCHC: 32.4 g/dL (ref 30.0–36.0)
MCV: 97.9 fL (ref 80.0–100.0)
Monocytes Absolute: 0.2 10*3/uL (ref 0.1–1.0)
Monocytes Relative: 4 %
Neutro Abs: 3.5 10*3/uL (ref 1.7–7.7)
Neutrophils Relative %: 89 %
Platelets: 252 10*3/uL (ref 150–400)
RBC: 2.43 MIL/uL — ABNORMAL LOW (ref 4.22–5.81)
RDW: 20.7 % — ABNORMAL HIGH (ref 11.5–15.5)
WBC: 4 10*3/uL (ref 4.0–10.5)
nRBC: 0 % (ref 0.0–0.2)

## 2021-07-18 LAB — COMPREHENSIVE METABOLIC PANEL
ALT: 21 U/L (ref 0–44)
AST: 32 U/L (ref 15–41)
Albumin: 2.6 g/dL — ABNORMAL LOW (ref 3.5–5.0)
Alkaline Phosphatase: 42 U/L (ref 38–126)
Anion gap: 6 (ref 5–15)
BUN: 17 mg/dL (ref 8–23)
CO2: 22 mmol/L (ref 22–32)
Calcium: 8.4 mg/dL — ABNORMAL LOW (ref 8.9–10.3)
Chloride: 100 mmol/L (ref 98–111)
Creatinine, Ser: 0.79 mg/dL (ref 0.61–1.24)
GFR, Estimated: >60 mL/min (ref >60)
Glucose, Bld: 115 mg/dL — ABNORMAL HIGH (ref 70–99)
Potassium: 3.7 mmol/L (ref 3.5–5.1)
Sodium: 128 mmol/L — ABNORMAL LOW (ref 135–145)
Total Bilirubin: 0.9 mg/dL (ref 0.3–1.2)
Total Protein: 6.4 g/dL — ABNORMAL LOW (ref 6.5–8.1)

## 2021-07-18 LAB — MAGNESIUM: Magnesium: 1.8 mg/dL (ref 1.7–2.4)

## 2021-07-18 LAB — RESP PANEL BY RT-PCR (FLU A&B, COVID) ARPGX2
Influenza A by PCR: NEGATIVE
Influenza B by PCR: NEGATIVE
SARS Coronavirus 2 by RT PCR: NEGATIVE

## 2021-07-18 LAB — PREPARE RBC (CROSSMATCH)

## 2021-07-18 MED ORDER — LEVOFLOXACIN 750 MG PO TABS: 750.0000 mg | ORAL_TABLET | Freq: Every day | ORAL | 0 refills | Status: AC

## 2021-07-18 MED ORDER — SODIUM CHLORIDE 0.9 % IV BOLUS
1000.0000 mL | Freq: Once | INTRAVENOUS | Status: AC
  Administered 2021-07-18: 1000 mL via INTRAVENOUS

## 2021-07-18 MED ORDER — LEVOFLOXACIN 750 MG PO TABS
750.0000 mg | ORAL_TABLET | Freq: Once | ORAL | Status: AC
  Administered 2021-07-18: 750 mg via ORAL
  Filled 2021-07-18: qty 1

## 2021-07-18 MED ORDER — SODIUM CHLORIDE 0.9 % IV SOLN
10.0000 mL/h | Freq: Once | INTRAVENOUS | Status: AC
  Administered 2021-07-18: 10 mL/h via INTRAVENOUS

## 2021-07-18 MED ORDER — HEPARIN SOD (PORK) LOCK FLUSH 100 UNIT/ML IV SOLN
500.0000 [IU] | Freq: Once | INTRAVENOUS | Status: AC
  Administered 2021-07-18: 500 [IU]
  Filled 2021-07-18: qty 5

## 2021-07-18 NOTE — ED Triage Notes (Signed)
Patient presents with c/o fever, fatigue and cough, started on Tuesday. Last chemo was Tuesday.

## 2021-07-18 NOTE — ED Notes (Signed)
Patient family call out saying patient is not feeling well. Patient report nausea and lightness. Patient was currently getting blood transfusion. Transfusion was stop and Dr. Zenia Resides came by and saw the patient. Dr. Doren Custard made aware and come to see the patient.

## 2021-07-18 NOTE — Discharge Instructions (Addendum)
You were diagnosed with pneumonia today and started antibiotics, which you will need to take for the next 4 days, with your next dose due tomorrow on 07/19/21.  You were also given 1 unit of blood has had blood transfusion for anemia today.  Please call to follow-up with your oncologist this week.  We talked about your blood cultures which were sent today.  If your cultures are positive for bacteria, you should get a phone call in the next 1-3 days telling you to come back immediately to the ER for IV antibiotics.  Your oncologist can also follow-up on this when you visit him in the office this week

## 2021-07-18 NOTE — ED Provider Notes (Signed)
[facility] DEPT Provider Note   CSN: [id number] Arrival date & time: 07/18/21  1010     History CC:  Fatigue   Corey [name] is a 75 y.o. male with a history of non-small cell lung cancer favoring adenocarcinoma, on palliative systemic chemotherapy with carboplatin AUC 5, Alimta 500 mg per metered squared, Keytruda 200 mg IV every 3 weeks (per oncologist note, Dr [name]), here with complaint of cough and fatigue.  The patient reports that his last round of chemo was about a week ago.  He said he is having worsening cough, although he has chronic dyspnea and cough, worse at night.  He reports clear phlegm that he is bringing up.  He reports some subjective fevers at home, no recorded temperature.  He says is not unusual for him.  He also reports excessive fatigue for the past several days, which he says is familiar to when he had anemia in the past.  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  He is not on home oxygen.  He has mildly worsening dyspnea.  He has had 3 total doses of the COVID-vaccine.  He says he lives at home with his wife, who does work at [employer], but she has not had any active symptoms.  He reports a very mild headache which is not unusual for him.  HPI     Past Medical History:  Diagnosis Date   Anemia    Anxiety    Arthritis    "back, right knee, hands, ankles, neck" (05/31/2016)   Carotid artery disease ([place]) 08/30/2016   Carotid US 11/19: R 1-39, L 40-59 // Carotid US 08/2019: R 1-39; L 40-59 // Carotid US 11/21: R 1-39; L 40-59>> repeat 1 year    Chronic lower back pain    Coronary atherosclerosis of native coronary artery    a. BMS to [facility] 2004 and 2007, otherwise mild nonobstructive disease. EF normal.   Diverticulitis    Dyslipidemia    Dyspnea    Essential hypertension, benign     Family history of breast cancer    Family history of lung cancer    Family history of prostate cancer    GERD (gastroesophageal reflux disease)    Lumbar radiculopathy, chronic 02/04/2015   Right L5   Obesity    OSA on CPAP    uses cpap   Paroxysmal atrial fibrillation ([place])    a. Discovered after stroke.   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   COPD with acute exacerbation ([place]) 04/02/2021   Cough with hemoptysis 04/01/2021   CAP (community acquired pneumonia) 04/01/2021   Family history of prostate cancer 03/20/2021   Family history of breast cancer 03/20/2021   Family history of lung cancer 03/20/2021   Goals of care, counseling/discussion 03/18/2021   Encounter for antineoplastic chemotherapy 03/18/2021   Encounter for antineoplastic immunotherapy 03/18/2021   Primary cancer of left lower lobe of lung ([place]) 03/07/2021  Non-small cell carcinoma of left lung, stage 4 (HCC) 03/05/2021   Malnutrition of moderate degree 02/25/2021   Pleural effusion on left 02/24/2021   Hilar mass    Reactive airway disease 11/20/2020   Allergic rhinitis with non-allergic component 09/18/2020   Heartburn 09/18/2020   Coughing 09/18/2020   Malignant neoplasm of prostate ([place]) 03/04/2020   Pre-syncope 02/27/2020   Cerebral embolism with transient ischemic attack (TIA) 02/11/2020   History of stroke 41/74/0814   Embolic stroke involving cerebral artery ([place]) 02/11/2020   Secondary hypercoagulable state (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[place]) 08/30/2016   Essential hypertension 08/30/2016   Diverticulitis of intestine without perforation or abscess without bleeding    Hyponatremia 06/04/2016   Diverticulitis 05/31/2016   Iron deficiency 01/05/2016   Lumbar radiculopathy, chronic 02/04/2015   SVT (supraventricular tachycardia) ([place]) 12/03/2014   PAC (premature atrial contraction) 04/05/2014   OSA on CPAP 03/27/2014   AKI (acute kidney injury) ([place]) 12/05/2013   Nausea 12/05/2013   Sepsis ([place]) 12/05/2013   Abdominal pain 12/05/2013   AF (paroxysmal atrial fibrillation) ([place]) 01/05/2013   CVA (cerebral infarction) 07/06/2012   Coronary artery disease    Benign essential HTN    Dyslipidemia    GERD (gastroesophageal reflux disease)     Past Surgical History:  Procedure Laterality Date   ADENOIDECTOMY     ANTERIOR CERVICAL DECOMP/DISCECTOMY FUSION  07/2001; 10/2002   "C5-6; C6-7; redo"   BACK SURGERY     CARPAL TUNNEL RELEASE Left 10/2015   CHEST TUBE INSERTION Left 03/17/2021   Procedure: INSERTION PLEURAL DRAINAGE CATHETER;  Surgeon: Garner Nash, DO;  Location: [facility];  Service: Pulmonary;  Laterality: Left;  Indwelling Tunneled Pleural catheter (PLEUREX)    COLONOSCOPY W/ POLYPECTOMY  02/2014   CORONARY ANGIOPLASTY WITH STENT PLACEMENT  05/2003; 12/2005   "mid RCA; mid RCA"   FINE NEEDLE ASPIRATION  02/26/2021   Procedure: FINE NEEDLE ASPIRATION (FNA) LINEAR;  Surgeon: Candee Furbish, MD;  Location: [facility] ENDOSCOPY;  Service: Pulmonary;;   HAND SURGERY  02/2019   LEFT HAND   implantable loop recorder placement  02/27/2020   [employer]onic Reveal [place] model [id number]  implantable loop recorder implanted by Dr Rayann Heman for afib management and evaluation of presyncope   IR IMAGING GUIDED PORT INSERTION  04/16/2021   JOINT REPLACEMENT     KNEE ARTHROSCOPY Left 10/2005   KNEE ARTHROSCOPY W/ PARTIAL MEDIAL MENISCECTOMY Left 09/2005   LUMBAR  LAMINECTOMY/DECOMPRESSION MICRODISCECTOMY  03/2005   "L4-5"   POSTERIOR LUMBAR FUSION  10/2003   L5-S1; "plates, screws"   SHOULDER ARTHROSCOPY Right 08/2011   Debridement of labrum, arthroscopic distal clavicle excision   SHOULDER OPEN ROTATOR CUFF REPAIR Left 07/2014   TEE WITHOUT CARDIOVERSION  07/07/2012   Procedure: TRANSESOPHAGEAL ECHOCARDIOGRAM (TEE);  Surgeon: Fay Records, MD;  Location[facility] ENDOSCOPY;  Service: Cardiovascular;  Laterality: N/A;   THORACENTESIS N/A 02/25/2021   Procedure: Mathews Robinsons;  Surgeon: Juanito Doom, MD;  Location: [facility];  Service: Cardiopulmonary;  Laterality: N/A;   TONSILLECTOMY AND ADENOIDECTOMY  ~ 1956   TOTAL KNEE ARTHROPLASTY Left 10/2006   TRIGGER FINGER RELEASE Left 10/2015   VIDEO BRONCHOSCOPY WITH ENDOBRONCHIAL ULTRASOUND Left 02/26/2021   Procedure: VIDEO BRONCHOSCOPY WITH ENDOBRONCHIAL ULTRASOUND;  Surgeon: Candee Furbish, MD;  Location: [facility] ENDOSCOPY;  Service: Pulmonary;  Laterality: Left;  cryoprobe too thanks!       Family History  Problem Relation Age of Onset   Hypertension Mother    Heart disease Father    Heart attack Father    Prostate cancer Brother 10   Parkinson's disease Brother    Lung cancer Paternal Aunt        hx of smoking   Breast cancer Paternal Grandmother        dx 66s, bilateral mastectomies   Heart attack Paternal Grandfather    Blindness Son    Colon cancer Neg Hx    Pancreatic cancer Neg Hx     Social History   Tobacco Use   Smoking status: Former    Packs/day: 1.00    Years: 34.00    Pack years: 34.00    Types: Cigarettes    Quit date: 05/05/2003    Years since quitting: 18.2   Smokeless tobacco: Never  Vaping Use   Vaping Use: Never used  Substance Use Topics   Alcohol use: No    Alcohol/week: 0.0 standard drinks   Drug use: No    Home Medications Prior to Admission medications   Medication Sig Start Date End Date Taking? Authorizing Provider  acetaminophen (TYLENOL 8 HOUR  ARTHRITIS PAIN) 650 MG CR tablet Take 1,300 mg by mouth every 8 (eight) hours as needed for pain (pain).   Yes [provider]  albuterol (VENTOLIN HFA) 108 (90 Base) MCG/ACT inhaler Inhale 2 puffs into the lungs every 4 (four) hours as needed for wheezing or shortness of breath. Inhale 2 puffs into the lungs every 4 hours as needed 04/04/21  Yes Rai, Ripudeep K, MD  ALPRAZolam (XANAX) 0.5 MG tablet Take 1 tablet (0.5 mg total) by mouth at bedtime as needed for anxiety. 05/20/21  Yes Pahwani, Einar Grad, MD  atorvastatin (LIPITOR) 40 MG tablet Take 1 tablet (40 mg total) by mouth daily. 01/21/21  Yes Nahser, Wonda Cheng, MD  benzonatate (TESSALON) 200 MG capsule TAKE 1 CAPSULE (200 MG TOTAL) BY MOUTH 3 (THREE) TIMES DAILY AS NEEDED FOR COUGH. 07/10/21  Yes Heilingoetter, Cassandra L, PA-C  carbidopa-levodopa (SINEMET IR) 25-100 MG tablet Take 1 tablet by mouth 3 (three) times daily. Patient taking differently: Take 1 tablet by mouth 3 (three) times daily. 0800, 1200, 1600 07/09/21  Yes Tat, Eustace Quail, DO  Carbinoxamine Maleate 6 MG TABS Take 1 tablet by mouth 2 (two) times daily as needed (drainage). 03/24/21  Yes Garnet Sierras, DO  chlorpheniramine-HYDROcodone (TUSSIONEX) 10-8 MG/5ML SUER Take 5 mLs by mouth every 12 (twelve) hours as needed for cough. 07/03/21  Yes Martyn Ehrich, NP  dofetilide (TIKOSYN) 500 MCG capsule Take 1 capsule (500 mcg total) by mouth 2 (two) times daily. Please keep upcoming appointment in February for future refills. Thank you 01/21/21  Yes Nahser, Wonda Cheng, MD  fenofibrate 160 MG tablet TAKE 1 TABLET DAILY. PLEASE KEEP UPCOMING APPT IN MARCH WITH DR. Acie Fredrickson BEFORE ANYMORE REFILLS. Patient taking differently: Take 160 mg by mouth daily. 01/21/21  Yes Nahser, Wonda Cheng, MD  fluticasone (FLONASE) 50 MCG/ACT nasal spray Place 2 sprays into both nostrils daily. 09/18/20  Yes Garnet Sierras, DO  folic acid (FOLVITE) 1 MG tablet TAKE 1 TABLET BY MOUTH EVERY DAY Patient taking differently:  Take 1 mg by mouth daily. 06/14/21  Yes Heilingoetter, Cassandra L, PA-C  ipratropium (ATROVENT) 0.03 % nasal spray Place 1-2 sprays into both nostrils every 12 (twelve) hours. For drainage 03/24/21  Yes Rexene Alberts  M, DO  levofloxacin (LEVAQUIN) 750 MG tablet Take 1 tablet (750 mg total) by mouth daily for 4 days. 07/19/21 07/23/21 Yes Kalis Friese, Carola Rhine, MD  lidocaine-prilocaine (EMLA) cream Apply 1 application topically as needed. Patient taking differently: Apply 1 application topically as needed (treatment). 04/07/21  Yes Heilingoetter, Cassandra L, PA-C  loperamide (IMODIUM) 2 MG capsule Take 2 mg by mouth daily as needed for diarrhea or loose stools.   Yes [provider]  magnesium oxide (MAG-OX) 400 (240 Mg) MG tablet TAKE 1 TABLET BY MOUTH EVERY DAY Patient taking differently: Take 400 mg by mouth daily. 06/03/21  Yes Fenton, Clint R, PA  metoprolol succinate (TOPROL-XL) 25 MG 24 hr tablet TAKE 1 TABLET BY MOUTH EVERY DAY Patient taking differently: Take 25 mg by mouth daily. 07/13/21  Yes AllredJeneen Rinks, MD  Multiple Vitamins-Minerals (MULTIVITAMINS THER. W/MINERALS) TABS Take 1 tablet by mouth daily.   Yes [provider]  nitroGLYCERIN (NITROSTAT) 0.4 MG SL tablet Place 1 tablet (0.4 mg total) under the tongue every 5 (five) minutes as needed for chest pain. 01/21/21  Yes Nahser, Wonda Cheng, MD  omeprazole (PRILOSEC) 20 MG capsule Take 1 capsule (20 mg total) by mouth daily. 03/24/21  Yes Garnet Sierras, DO  ondansetron (ZOFRAN) 8 MG tablet Take 1 tablet (8 mg total) by mouth every 8 (eight) hours as needed for nausea or vomiting. Starting 3 days after chemotherapy 05/11/21  Yes Curt Bears, MD  potassium chloride (KLOR-CON) 10 MEQ tablet TAKE 1 TABLET BY MOUTH EVERY DAY Patient taking differently: Take 10 mEq by mouth daily. 07/16/21  Yes Nahser, Wonda Cheng, MD  promethazine (PHENERGAN) 25 MG tablet Take 1 tablet (25 mg total) by mouth every 6 (six) hours as needed for nausea or  vomiting. 03/25/21  Yes Curt Bears, MD  Saccharomyces boulardii (PROBIOTIC) 250 MG CAPS Take 250 mg by mouth daily.   Yes [provider]  solifenacin (VESICARE) 5 MG tablet Take 5 mg by mouth at bedtime.   Yes [provider]  tamsulosin (FLOMAX) 0.4 MG CAPS capsule Take 0.4 mg by mouth daily after supper.   Yes [provider]  Tiotropium Bromide-Olodaterol (STIOLTO RESPIMAT) 2.5-2.5 MCG/ACT AERS Inhale 2 puffs into the lungs daily. 04/04/21  Yes Rai, Ripudeep K, MD  topiramate (TOPAMAX) 50 MG tablet Take 2 tablets (100 mg total) by mouth 2 (two) times daily. 04/13/21  Yes McCue, Janett Billow, NP  traMADol (ULTRAM) 50 MG tablet Take 1 tablet (50 mg total) by mouth every 6 (six) hours as needed for moderate pain. 05/20/21  Yes Darliss Cheney, MD  traZODone (DESYREL) 50 MG tablet Take 0.5 tablets (25 mg total) by mouth at bedtime as needed for sleep. 09/22/20  Yes McCue, Janett Billow, NP  Vitamin D, Ergocalciferol, (DRISDOL) 1.25 MG (50000 UNIT) CAPS capsule TAKE 1 CAPSULE BY MOUTH ONE TIME PER WEEK Patient taking differently: Take 50,000 Units by mouth every 7 (seven) days. Monday 03/19/21  Yes Kale, Cloria Spring, MD  XARELTO 20 MG TABS tablet TAKE 1 TABLET BY MOUTH EVERY DAY Patient taking differently: 20 mg daily with supper. 10/06/20  Yes Nahser, Wonda Cheng, MD  diclofenac Sodium (VOLTAREN) 1 % GEL Apply 4 g topically 4 (four) times daily. Patient not taking: No sig reported 02/27/21   Cristal Ford, DO  leuprolide, 6 Month, (ELIGARD) 45 MG injection Inject 45 mg into the skin every 6 (six) months. Patient not taking: Reported on 07/14/2021    [provider]    Allergies  Amoxicillin, Fosinopril, Hydromorphone, Ampicillin, Monopril [fosinopril sodium], Other, and Rosuvastatin  Review of Systems   Review of Systems  Constitutional:  Positive for fatigue. Negative for chills and fever.  HENT:  Negative for ear pain and sore throat.   Eyes:  Negative for pain and  visual disturbance.  Respiratory:  Positive for cough. Negative for shortness of breath.   Cardiovascular:  Negative for chest pain and palpitations.  Gastrointestinal:  Negative for abdominal pain and vomiting.  Genitourinary:  Negative for dysuria and hematuria.  Musculoskeletal:  Negative for arthralgias and back pain.  Skin:  Negative for color change and rash.  Neurological:  Positive for light-headedness and headaches. Negative for syncope.  All other systems reviewed and are negative.  Physical Exam Updated Vital Signs BP 125/62   Pulse 88   Temp 98.1 F (36.7 C) (Oral)   Resp 16   Ht _0  (1.854 m)   Wt 108 kg   SpO2 97%   BMI 31.40 kg/m   Physical Exam Constitutional:      General: He is not in acute distress. HENT:     Head: Normocephalic and atraumatic.  Eyes:     Conjunctiva/sclera: Conjunctivae normal.     Pupils: Pupils are equal, round, and reactive to light.  Cardiovascular:     Rate and Rhythm: Normal rate and regular rhythm.     Comments: Subcutaneous right chest wall port Pulmonary:     Effort: Pulmonary effort is normal. No respiratory distress.  Abdominal:     General: There is no distension.     Tenderness: There is no abdominal tenderness.  Skin:    General: Skin is warm and dry.  Neurological:     General: No focal deficit present.     Mental Status: He is alert. Mental status is at baseline.  Psychiatric:        Mood and Affect: Mood normal.        Behavior: Behavior normal.    ED Results / Procedures / Treatments   Labs (all labs ordered are listed, but only abnormal results are displayed) Labs Reviewed  COMPREHENSIVE METABOLIC PANEL - Abnormal; Notable for the following components:      Result Value   Sodium 128 (*)    Glucose, Bld 115 (*)    Calcium 8.4 (*)    Total Protein 6.4 (*)    Albumin 2.6 (*)    All other components within normal limits  CBC WITH DIFFERENTIAL/PLATELET - Abnormal; Notable for the following components:    RBC 2.43 (*)    Hemoglobin 7.7 (*)    HCT 23.8 (*)    RDW 20.7 (*)    Lymphs Abs 0.2 (*)    All other components within normal limits  RESP PANEL BY RT-PCR (FLU A&B, COVID) ARPGX2  CULTURE, BLOOD (SINGLE)  MAGNESIUM  URINALYSIS, ROUTINE W REFLEX MICROSCOPIC  TYPE AND SCREEN  PREPARE RBC (CROSSMATCH)    EKG None  Radiology DG Chest 2 View  Result Date: 07/18/2021 CLINICAL DATA:  Evaluate for pneumonia EXAM: CHEST - 2 VIEW COMPARISON:  06/05/2021 FINDINGS: Right chest port catheter. Cardiomegaly with implantable loop recorder. Increased heterogeneous airspace opacity of the left lower lobe. IMPRESSION: 1. Increased heterogeneous airspace opacity of the left lower lobe, concerning for infection. 2.  Cardiomegaly. Electronically Signed   By: Delanna Ahmadi M.D.   On: 07/18/2021 12:05    Procedures .Critical Care Performed by: Wyvonnia Dusky, MD Authorized by: Wyvonnia Dusky, MD   Critical  care provider statement:    Critical care time (minutes):  42   Critical care time was exclusive of:  Separately billable procedures and treating other patients   Critical care was necessary to treat or prevent imminent or life-threatening deterioration of the following conditions:  Circulatory failure   Critical care was time spent personally by me on the following activities:  Ordering and performing treatments and interventions, ordering and review of laboratory studies, ordering and review of radiographic studies, pulse oximetry, review of old charts, examination of patient and evaluation of patient's response to treatment Comments:     Blood transfusion for symptomatic anemia   Medications Ordered in ED Medications  levofloxacin (LEVAQUIN) tablet 750 mg (750 mg Oral Given 07/18/21 1244)  sodium chloride 0.9 % bolus 1,000 mL (1,000 mLs Intravenous New Bag/Given 07/18/21 1234)  0.9 %  sodium chloride infusion (10 mL/hr Intravenous New Bag/Given 07/18/21 1245)    ED Course  I have  reviewed the triage vital signs and the nursing notes.  Pertinent labs & imaging results that were available during my care of the patient were reviewed by me and considered in my medical decision making (see chart for details).  Ddx for fatigue includes infection vs anemia vs chemo side effect vs other  I personally reviewed the patient's prior medical records including his oncology outpatient work-up and recent imaging test.  I reviewed and interpreted the patient's blood tests and imaging today, which, which was notable for a left-sided infiltrate consistent with pneumonia,mild hyponatremia with a sodium of 128.  Potassium and magnesium levels within normal limits.  Creatinine within normal limits.  UA without sign of infection.  COVID and flu are negative.  Patient is anemic with a hemoglobin of 7.7; he was consented for blood transfusion given 1 unit of blood.  Overall I have a lower suspicion for sepsis with no leukocytosis, no fever, and that he is clinically well-appearing.  We have sent a single culture off of his central line which his oncologist can follow-up with, and explained to the patient return precautions and need for oncology follow-up with completion of his antibiotics to see if he is improved.Marland Kitchen  He verbalized understanding.   Clinical Course as of 07/19/21 1610  Sat Jul 18, 2021  1222 Possible focal infiltrate in the left side which appears new, given his clinical symptoms I think is reasonable to treat for community pneumonia.  He does have a penicillin allergies.  Adding Levaquin to be better empiric treatment.  Patient is agreeable to this.  We also discussed his hemoglobin level, he states that generally when his level drops below 8 and he is feeling the symptomatic he will get a transfusion.  He is asking for 1 and I think this is reasonable.  I consented the patient for blood transfusion and will give him 1 unit pRBC here.  After completion of the infusion and anticipating  discharge home.  The patient is in agreement.  He is also receiving some normal saline fluid bolus for his mild hyponatremia. [MT]  [place] and flu are negative [MT]  1347 Blood transfusion started.  Patient updated on plan to complete transfusion and likely discharge home afterwards on oral antibiotics.  We will send 1 set of blood cultures which were drawn today. [MT]    Clinical Course User Index [MT] Ellie Spickler, Carola Rhine, MD   Final Clinical Impression(s) / ED Diagnoses Final diagnoses:  Anemia, unspecified type  Community acquired pneumonia of left lower lobe of  lung    Rx / DC Orders ED Discharge Orders          Ordered    levofloxacin (LEVAQUIN) 750 MG tablet  Daily        07/18/21 1406             Wyvonnia Dusky, MD 07/19/21 [phone]

## 2021-07-18 NOTE — ED Provider Notes (Signed)
Cancer patient presented for recurrence of anemia.  He typically gets transfused when hemoglobin drops below 8.  It is 7.7 today.  He endorses some symptoms of anemia.  He is getting 1 unit of PRBC transfused.  After this, he can be discharged home. Physical Exam  BP 131/84   Pulse 81   Temp 98.1 F (36.7 C) (Oral)   Resp 19   Ht 6\' 1"  (1.854 m)   Wt 108 kg   SpO2 94%   BMI 31.40 kg/m   Physical Exam Constitutional:      General: He is not in acute distress.    Appearance: Normal appearance. He is not ill-appearing, toxic-appearing or diaphoretic.  Skin:    General: Skin is warm and dry.  Neurological:     Mental Status: He is alert and oriented to person, place, and time.  Psychiatric:        Mood and Affect: Mood normal.        Behavior: Behavior normal.        Thought Content: Thought content normal.        Judgment: Judgment normal.    ED Course/Procedures   Clinical Course as of 07/18/21 1749  Sat Jul 18, 2021  1222 Possible focal infiltrate in the left side which appears new, given his clinical symptoms I think is reasonable to treat for community pneumonia.  He does have a penicillin allergies.  Adding Levaquin to be better empiric treatment.  Patient is agreeable to this.  We also discussed his hemoglobin level, he states that generally when his level drops below 8 and he is feeling the symptomatic he will get a transfusion.  He is asking for 1 and I think this is reasonable.  I consented the patient for blood transfusion and will give him 1 unit pRBC here.  After completion of the infusion and anticipating discharge home.  The patient is in agreement.  He is also receiving some normal saline fluid bolus for his mild hyponatremia. [MT]  [place] and flu are negative [MT]  1347 Blood transfusion started.  Patient updated on plan to complete transfusion and likely discharge home afterwards on oral antibiotics.  We will send 1 set of blood cultures which were drawn today.  [MT]    Clinical Course User Index [MT] Trifan, Carola Rhine, MD    Procedures  MDM  Patient undergoing blood transfusion.  During his transfusion, he was given a dose of Levaquin.  This was on an empty stomach.  Patient had not eaten since this morning.  He subsequently developed some nausea.  Transfusion was stopped and patient was given some crackers to eat and some water to drink.  Nausea subsided.  Transfusion was restarted without any further symptoms.  Patient attributes the episode of nausea to taking medication on an empty stomach.  He states that this does happen at times.  Patient completed transfusion and felt well.  He continues to be well-appearing on exam.  Patient was discharged in stable condition.       Godfrey Pick, MD 07/18/21 1750

## 2021-07-20 ENCOUNTER — Telehealth: Payer: Self-pay | Admitting: Medical Oncology

## 2021-07-20 LAB — BPAM RBC
Blood Product Expiration Date: 202211152359
ISSUE DATE / TIME: 202210151331
Unit Type and Rh: 5100

## 2021-07-20 LAB — TYPE AND SCREEN
ABO/RH(D): O POS
Antibody Screen: NEGATIVE
Unit division: 0

## 2021-07-20 NOTE — Telephone Encounter (Signed)
Weakness ,no energy coughing ,  He went to ED on sat. -HGB 7.7 . He received  1 unit of blood and was started on  Levaquin for pneumonia.   Today pt called and says he is still very weak  no energy ,having a hard time getting out of bed due to fatigue.  Pt stated  he needs at least one more unit to feel better.  Please advise.

## 2021-07-21 ENCOUNTER — Other Ambulatory Visit: Payer: Self-pay | Admitting: Medical Oncology

## 2021-07-21 ENCOUNTER — Telehealth: Payer: Self-pay | Admitting: Internal Medicine

## 2021-07-21 ENCOUNTER — Encounter: Payer: Self-pay | Admitting: Internal Medicine

## 2021-07-21 DIAGNOSIS — D649 Anemia, unspecified: Secondary | ICD-10-CM

## 2021-07-21 NOTE — Telephone Encounter (Signed)
Scheduled per sch msg. Called and spoke with patient. Confirmed appt  

## 2021-07-21 NOTE — Progress Notes (Signed)
Carelink Summary Report / Loop Recorder 

## 2021-07-22 ENCOUNTER — Telehealth: Payer: Self-pay | Admitting: Physician Assistant

## 2021-07-22 ENCOUNTER — Inpatient Hospital Stay: Payer: Medicare PPO

## 2021-07-22 ENCOUNTER — Other Ambulatory Visit: Payer: Self-pay

## 2021-07-22 VITALS — BP 113/72 | HR 97 | Temp 97.9°F | Resp 18

## 2021-07-22 DIAGNOSIS — Z5112 Encounter for antineoplastic immunotherapy: Secondary | ICD-10-CM | POA: Diagnosis not present

## 2021-07-22 DIAGNOSIS — C3492 Malignant neoplasm of unspecified part of left bronchus or lung: Secondary | ICD-10-CM

## 2021-07-22 DIAGNOSIS — D649 Anemia, unspecified: Secondary | ICD-10-CM

## 2021-07-22 DIAGNOSIS — Z79899 Other long term (current) drug therapy: Secondary | ICD-10-CM | POA: Diagnosis not present

## 2021-07-22 DIAGNOSIS — Z23 Encounter for immunization: Secondary | ICD-10-CM | POA: Diagnosis not present

## 2021-07-22 DIAGNOSIS — Z95828 Presence of other vascular implants and grafts: Secondary | ICD-10-CM

## 2021-07-22 DIAGNOSIS — J910 Malignant pleural effusion: Secondary | ICD-10-CM | POA: Diagnosis not present

## 2021-07-22 DIAGNOSIS — C787 Secondary malignant neoplasm of liver and intrahepatic bile duct: Secondary | ICD-10-CM | POA: Diagnosis not present

## 2021-07-22 DIAGNOSIS — C3412 Malignant neoplasm of upper lobe, left bronchus or lung: Secondary | ICD-10-CM | POA: Diagnosis not present

## 2021-07-22 DIAGNOSIS — C778 Secondary and unspecified malignant neoplasm of lymph nodes of multiple regions: Secondary | ICD-10-CM | POA: Diagnosis not present

## 2021-07-22 DIAGNOSIS — Z5111 Encounter for antineoplastic chemotherapy: Secondary | ICD-10-CM | POA: Diagnosis not present

## 2021-07-22 LAB — CBC WITH DIFFERENTIAL (CANCER CENTER ONLY)
Abs Immature Granulocytes: 0.01 10*3/uL (ref 0.00–0.07)
Basophils Absolute: 0 10*3/uL (ref 0.0–0.1)
Basophils Relative: 1 %
Eosinophils Absolute: 0 10*3/uL (ref 0.0–0.5)
Eosinophils Relative: 2 %
HCT: 25.1 % — ABNORMAL LOW (ref 39.0–52.0)
Hemoglobin: 8.6 g/dL — ABNORMAL LOW (ref 13.0–17.0)
Immature Granulocytes: 1 %
Lymphocytes Relative: 7 %
Lymphs Abs: 0.1 10*3/uL — ABNORMAL LOW (ref 0.7–4.0)
MCH: 31 pg (ref 26.0–34.0)
MCHC: 34.3 g/dL (ref 30.0–36.0)
MCV: 90.6 fL (ref 80.0–100.0)
Monocytes Absolute: 0.5 10*3/uL (ref 0.1–1.0)
Monocytes Relative: 34 %
Neutro Abs: 0.8 10*3/uL — ABNORMAL LOW (ref 1.7–7.7)
Neutrophils Relative %: 55 %
Platelet Count: 117 10*3/uL — ABNORMAL LOW (ref 150–400)
RBC: 2.77 MIL/uL — ABNORMAL LOW (ref 4.22–5.81)
RDW: 18.8 % — ABNORMAL HIGH (ref 11.5–15.5)
WBC Count: 1.4 10*3/uL — ABNORMAL LOW (ref 4.0–10.5)
nRBC: 0 % (ref 0.0–0.2)

## 2021-07-22 LAB — TSH: TSH: 2.536 u[IU]/mL (ref 0.320–4.118)

## 2021-07-22 LAB — CMP (CANCER CENTER ONLY)
ALT: 15 U/L (ref 0–44)
AST: 28 U/L (ref 15–41)
Albumin: 2.2 g/dL — ABNORMAL LOW (ref 3.5–5.0)
Alkaline Phosphatase: 54 U/L (ref 38–126)
Anion gap: 11 (ref 5–15)
BUN: 16 mg/dL (ref 8–23)
CO2: 19 mmol/L — ABNORMAL LOW (ref 22–32)
Calcium: 9.3 mg/dL (ref 8.9–10.3)
Chloride: 101 mmol/L (ref 98–111)
Creatinine: 0.79 mg/dL (ref 0.61–1.24)
GFR, Estimated: >60 mL/min (ref >60)
Glucose, Bld: 112 mg/dL — ABNORMAL HIGH (ref 70–99)
Potassium: 3.9 mmol/L (ref 3.5–5.1)
Sodium: 131 mmol/L — ABNORMAL LOW (ref 135–145)
Total Bilirubin: 0.8 mg/dL (ref 0.3–1.2)
Total Protein: 6.9 g/dL (ref 6.5–8.1)

## 2021-07-22 LAB — SAMPLE TO BLOOD BANK

## 2021-07-22 LAB — PREPARE RBC (CROSSMATCH)

## 2021-07-22 MED ORDER — SODIUM CHLORIDE 0.9% FLUSH
10.0000 mL | INTRAVENOUS | Status: AC | PRN
  Administered 2021-07-22: 10 mL

## 2021-07-22 MED ORDER — DIPHENHYDRAMINE HCL 25 MG PO CAPS
25.0000 mg | ORAL_CAPSULE | Freq: Once | ORAL | Status: AC
  Administered 2021-07-22: 25 mg via ORAL
  Filled 2021-07-22: qty 1

## 2021-07-22 MED ORDER — ACETAMINOPHEN 325 MG PO TABS
650.0000 mg | ORAL_TABLET | Freq: Once | ORAL | Status: AC
  Administered 2021-07-22: 650 mg via ORAL
  Filled 2021-07-22: qty 2

## 2021-07-22 MED ORDER — SODIUM CHLORIDE 0.9% FLUSH
10.0000 mL | Freq: Once | INTRAVENOUS | Status: AC
  Administered 2021-07-22: 10 mL

## 2021-07-22 MED ORDER — SODIUM CHLORIDE 0.9% IV SOLUTION
250.0000 mL | Freq: Once | INTRAVENOUS | Status: AC
  Administered 2021-07-22: 250 mL via INTRAVENOUS

## 2021-07-22 MED ORDER — HEPARIN SOD (PORK) LOCK FLUSH 100 UNIT/ML IV SOLN
500.0000 [IU] | Freq: Every day | INTRAVENOUS | Status: AC | PRN
  Administered 2021-07-22: 500 [IU]

## 2021-07-22 NOTE — Telephone Encounter (Signed)
The patient's hemoglobin is 8.6. He does not need another unit of blood at this time, however, I know he is more comfortable with monitoring his labs closely so I sent a scheduling message to arrange for a lab visit next week. I left a voicemail with this plan and if he has any questions or concerns he can call us back.

## 2021-07-22 NOTE — Progress Notes (Signed)
Per Dr. [name], patient is to get blood today with hgb 8.6 due to him being symptomatic.

## 2021-07-22 NOTE — Patient Instructions (Signed)
Blood Transfusion, Adult, Care After This sheet gives you information about how to care for yourself after your procedure. Your doctor may also give you more specific instructions. If you have problems or questions, contact your doctor. What can I expect after the procedure? After the procedure, it is common to have: Bruising and soreness at the IV site. A headache. Follow these instructions at home: Insertion site care   Follow instructions from your doctor about how to take care of your insertion site. This is where an IV tube was put into your vein. Make sure you: Wash your hands with soap and water before and after you change your bandage (dressing). If you cannot use soap and water, use hand sanitizer. Change your bandage as told by your doctor. Check your insertion site every day for signs of infection. Check for: Redness, swelling, or pain. Bleeding from the site. Warmth. Pus or a bad smell. General instructions Take over-the-counter and prescription medicines only as told by your doctor. Rest as told by your doctor. Go back to your normal activities as told by your doctor. Keep all follow-up visits as told by your doctor. This is important. Contact a doctor if: You have itching or red, swollen areas of skin (hives). You feel worried or nervous (anxious). You feel weak after doing your normal activities. You have redness, swelling, warmth, or pain around the insertion site. You have blood coming from the insertion site, and the blood does not stop with pressure. You have pus or a bad smell coming from the insertion site. Get help right away if: You have signs of a serious reaction. This may be coming from an allergy or the body's defense system (immune system). Signs include: Trouble breathing or shortness of breath. Swelling of the face or feeling warm (flushed). Fever or chills. Head, chest, or back pain. Dark pee (urine) or blood in the pee. Widespread rash. Fast  heartbeat. Feeling dizzy or light-headed. You may receive your blood transfusion in an outpatient setting. If so, you will be told whom to contact to report any reactions. These symptoms may be an emergency. Do not wait to see if the symptoms will go away. Get medical help right away. Call your local emergency services (911 in the [place] Do not drive yourself to the hospital. Summary Bruising and soreness at the IV site are common. Check your insertion site every day for signs of infection. Rest as told by your doctor. Go back to your normal activities as told by your doctor. Get help right away if you have signs of a serious reaction. This information is not intended to replace advice given to you by your health care provider. Make sure you discuss any questions you have with your health care provider. Document Revised: 01/15/2021 Document Reviewed: 03/15/2019 Elsevier Patient Education  2022 Elsevier Inc.  

## 2021-07-23 ENCOUNTER — Telehealth: Payer: Self-pay | Admitting: Cardiovascular Disease

## 2021-07-23 LAB — CULTURE, BLOOD (SINGLE)
Culture: NO GROWTH
Special Requests: ADEQUATE

## 2021-07-23 NOTE — Telephone Encounter (Signed)
Left message for patient to call back  

## 2021-07-23 NOTE — Telephone Encounter (Signed)
Pt c/o swelling: STAT is pt has developed SOB within 24 hours  How much weight have you gained and in what time span? NO WEIGHT GAIN  If swelling, where is the swelling located? BILATERAL ANKLE/FEET  Are you currently taking a fluid pill? NO  Are you currently SOB? NO BUT PT HAS LUNG CANCER  Do you have a log of your daily weights (if so, list)? NO, PT WEIGHS EVERY 2 WEEKS AT THE [facility]  Have you gained 3 pounds in a day or 5 pounds in a week?   Have you traveled recently? NO  PT STARTED NEW ANTIBIOTIC ON Saturday AND FINISHED YESTERDAY FOR COUGH FROM [facility].    PT IS CURRENTLY TAKING A NAP

## 2021-07-24 NOTE — Telephone Encounter (Signed)
Pt's wife is returning call °

## 2021-07-24 NOTE — Telephone Encounter (Signed)
Left message for patient's wife to call back.  

## 2021-07-24 NOTE — Telephone Encounter (Signed)
Spoke with the patient's wife who reports that she noticed last week her husband's feet and ankles were swollen. She states that he was diagnosed with lung cancer back in June and has been going through multiple rounds of chem and radiation. He has also had to have several blood transfusions, most recently this past Wednesday. She states that the patient has not been moving around as much because he has gotten weak. He has switched from using a cane to a walker. His wife his having to help him out a lot more recently. She reports that he has lost 30 lbs since his diagnosis. He was also put on 5 days of antibiotics last week for possible pneumonia.  She states that she only noticed the swelling last week but is not sure how long it has been there. The patient has been elevating his legs. He does not have any compression hose. He has some shortness of breath on exertion but nothing more than his baseline. Patient does not have much of an appetite but wife reports that he does eat some frozen [employer] meals and soups, which I have advised to try and avoid although difficult because sometimes it is the only things that she can get the patient to eat.

## 2021-07-24 NOTE — Telephone Encounter (Signed)
Patient's wife returning call. 

## 2021-07-24 NOTE — Telephone Encounter (Signed)
Spoke with patient and his wife and gave them recommendations from Dr. Acie Fredrickson. They verbalized understanding and will call back if swelling does not improve.

## 2021-07-26 LAB — TYPE AND SCREEN
ABO/RH(D): O POS
Antibody Screen: NEGATIVE
Unit division: 0
Unit division: 0

## 2021-07-26 LAB — BPAM RBC
Blood Product Expiration Date: 202211202359
Blood Product Expiration Date: 202211202359
ISSUE DATE / TIME: 202210191004
ISSUE DATE / TIME: 202210191004
Unit Type and Rh: 5100
Unit Type and Rh: 5100

## 2021-07-28 ENCOUNTER — Encounter (HOSPITAL_BASED_OUTPATIENT_CLINIC_OR_DEPARTMENT_OTHER): Payer: Self-pay

## 2021-07-28 ENCOUNTER — Emergency Department (HOSPITAL_BASED_OUTPATIENT_CLINIC_OR_DEPARTMENT_OTHER): Payer: Medicare PPO | Admitting: Radiology

## 2021-07-28 ENCOUNTER — Other Ambulatory Visit: Payer: Self-pay

## 2021-07-28 ENCOUNTER — Other Ambulatory Visit: Payer: Medicare PPO

## 2021-07-28 ENCOUNTER — Inpatient Hospital Stay (HOSPITAL_BASED_OUTPATIENT_CLINIC_OR_DEPARTMENT_OTHER)
Admission: EM | Admit: 2021-07-28 | Discharge: 2021-08-12 | DRG: 205 | Disposition: A | Discharge status: Rehabilitation Facility | Payer: Medicare PPO | Attending: Internal Medicine | Admitting: Internal Medicine

## 2021-07-28 DIAGNOSIS — E669 Obesity, unspecified: Secondary | ICD-10-CM | POA: Diagnosis present

## 2021-07-28 DIAGNOSIS — Z801 Family history of malignant neoplasm of trachea, bronchus and lung: Secondary | ICD-10-CM

## 2021-07-28 DIAGNOSIS — C3432 Malignant neoplasm of lower lobe, left bronchus or lung: Secondary | ICD-10-CM | POA: Diagnosis present

## 2021-07-28 DIAGNOSIS — M329 Systemic lupus erythematosus, unspecified: Secondary | ICD-10-CM | POA: Diagnosis present

## 2021-07-28 DIAGNOSIS — J9601 Acute respiratory failure with hypoxia: Secondary | ICD-10-CM | POA: Diagnosis not present

## 2021-07-28 DIAGNOSIS — L03116 Cellulitis of left lower limb: Secondary | ICD-10-CM | POA: Diagnosis present

## 2021-07-28 DIAGNOSIS — I700 Atherosclerosis of aorta: Secondary | ICD-10-CM | POA: Diagnosis present

## 2021-07-28 DIAGNOSIS — J9621 Acute and chronic respiratory failure with hypoxia: Secondary | ICD-10-CM | POA: Diagnosis present

## 2021-07-28 DIAGNOSIS — C3490 Malignant neoplasm of unspecified part of unspecified bronchus or lung: Secondary | ICD-10-CM | POA: Diagnosis not present

## 2021-07-28 DIAGNOSIS — G20 Parkinson's disease: Secondary | ICD-10-CM | POA: Diagnosis present

## 2021-07-28 DIAGNOSIS — R0902 Hypoxemia: Secondary | ICD-10-CM

## 2021-07-28 DIAGNOSIS — J90 Pleural effusion, not elsewhere classified: Secondary | ICD-10-CM | POA: Diagnosis not present

## 2021-07-28 DIAGNOSIS — H538 Other visual disturbances: Secondary | ICD-10-CM | POA: Diagnosis present

## 2021-07-28 DIAGNOSIS — I10 Essential (primary) hypertension: Secondary | ICD-10-CM | POA: Diagnosis present

## 2021-07-28 DIAGNOSIS — R918 Other nonspecific abnormal finding of lung field: Secondary | ICD-10-CM | POA: Diagnosis not present

## 2021-07-28 DIAGNOSIS — J189 Pneumonia, unspecified organism: Secondary | ICD-10-CM | POA: Diagnosis present

## 2021-07-28 DIAGNOSIS — G4733 Obstructive sleep apnea (adult) (pediatric): Secondary | ICD-10-CM | POA: Diagnosis present

## 2021-07-28 DIAGNOSIS — Z23 Encounter for immunization: Secondary | ICD-10-CM | POA: Diagnosis not present

## 2021-07-28 DIAGNOSIS — Z87891 Personal history of nicotine dependence: Secondary | ICD-10-CM

## 2021-07-28 DIAGNOSIS — R0603 Acute respiratory distress: Secondary | ICD-10-CM | POA: Diagnosis not present

## 2021-07-28 DIAGNOSIS — J9811 Atelectasis: Secondary | ICD-10-CM | POA: Diagnosis present

## 2021-07-28 DIAGNOSIS — R0602 Shortness of breath: Secondary | ICD-10-CM

## 2021-07-28 DIAGNOSIS — R112 Nausea with vomiting, unspecified: Secondary | ICD-10-CM

## 2021-07-28 DIAGNOSIS — J9611 Chronic respiratory failure with hypoxia: Secondary | ICD-10-CM | POA: Diagnosis not present

## 2021-07-28 DIAGNOSIS — E871 Hypo-osmolality and hyponatremia: Secondary | ICD-10-CM | POA: Diagnosis present

## 2021-07-28 DIAGNOSIS — J441 Chronic obstructive pulmonary disease with (acute) exacerbation: Secondary | ICD-10-CM | POA: Diagnosis not present

## 2021-07-28 DIAGNOSIS — C787 Secondary malignant neoplasm of liver and intrahepatic bile duct: Secondary | ICD-10-CM | POA: Diagnosis present

## 2021-07-28 DIAGNOSIS — T50905A Adverse effect of unspecified drugs, medicaments and biological substances, initial encounter: Secondary | ICD-10-CM | POA: Diagnosis not present

## 2021-07-28 DIAGNOSIS — I517 Cardiomegaly: Secondary | ICD-10-CM | POA: Diagnosis not present

## 2021-07-28 DIAGNOSIS — C3492 Malignant neoplasm of unspecified part of left bronchus or lung: Secondary | ICD-10-CM | POA: Diagnosis not present

## 2021-07-28 DIAGNOSIS — Z8673 Personal history of transient ischemic attack (TIA), and cerebral infarction without residual deficits: Secondary | ICD-10-CM

## 2021-07-28 DIAGNOSIS — Z9981 Dependence on supplemental oxygen: Secondary | ICD-10-CM | POA: Diagnosis not present

## 2021-07-28 DIAGNOSIS — Z20822 Contact with and (suspected) exposure to covid-19: Secondary | ICD-10-CM | POA: Diagnosis present

## 2021-07-28 DIAGNOSIS — J704 Drug-induced interstitial lung disorders, unspecified: Principal | ICD-10-CM | POA: Diagnosis present

## 2021-07-28 DIAGNOSIS — Z8546 Personal history of malignant neoplasm of prostate: Secondary | ICD-10-CM

## 2021-07-28 DIAGNOSIS — Z7901 Long term (current) use of anticoagulants: Secondary | ICD-10-CM

## 2021-07-28 DIAGNOSIS — M549 Dorsalgia, unspecified: Secondary | ICD-10-CM | POA: Diagnosis not present

## 2021-07-28 DIAGNOSIS — I480 Paroxysmal atrial fibrillation: Secondary | ICD-10-CM | POA: Diagnosis present

## 2021-07-28 DIAGNOSIS — I2510 Atherosclerotic heart disease of native coronary artery without angina pectoris: Secondary | ICD-10-CM | POA: Diagnosis present

## 2021-07-28 DIAGNOSIS — K3189 Other diseases of stomach and duodenum: Secondary | ICD-10-CM | POA: Diagnosis not present

## 2021-07-28 DIAGNOSIS — J910 Malignant pleural effusion: Secondary | ICD-10-CM | POA: Diagnosis present

## 2021-07-28 DIAGNOSIS — Z955 Presence of coronary angioplasty implant and graft: Secondary | ICD-10-CM

## 2021-07-28 DIAGNOSIS — Z981 Arthrodesis status: Secondary | ICD-10-CM

## 2021-07-28 DIAGNOSIS — G8929 Other chronic pain: Secondary | ICD-10-CM | POA: Diagnosis present

## 2021-07-28 DIAGNOSIS — F4323 Adjustment disorder with mixed anxiety and depressed mood: Secondary | ICD-10-CM | POA: Diagnosis not present

## 2021-07-28 DIAGNOSIS — M47816 Spondylosis without myelopathy or radiculopathy, lumbar region: Secondary | ICD-10-CM | POA: Diagnosis not present

## 2021-07-28 DIAGNOSIS — R059 Cough, unspecified: Secondary | ICD-10-CM | POA: Diagnosis not present

## 2021-07-28 DIAGNOSIS — D849 Immunodeficiency, unspecified: Secondary | ICD-10-CM | POA: Diagnosis present

## 2021-07-28 DIAGNOSIS — D649 Anemia, unspecified: Secondary | ICD-10-CM | POA: Diagnosis present

## 2021-07-28 DIAGNOSIS — Z66 Do not resuscitate: Secondary | ICD-10-CM | POA: Diagnosis present

## 2021-07-28 DIAGNOSIS — I669 Occlusion and stenosis of unspecified cerebral artery: Secondary | ICD-10-CM | POA: Diagnosis present

## 2021-07-28 DIAGNOSIS — R590 Localized enlarged lymph nodes: Secondary | ICD-10-CM | POA: Diagnosis present

## 2021-07-28 DIAGNOSIS — J690 Pneumonitis due to inhalation of food and vomit: Secondary | ICD-10-CM | POA: Diagnosis not present

## 2021-07-28 DIAGNOSIS — R5381 Other malaise: Secondary | ICD-10-CM | POA: Diagnosis present

## 2021-07-28 DIAGNOSIS — F419 Anxiety disorder, unspecified: Secondary | ICD-10-CM | POA: Diagnosis present

## 2021-07-28 DIAGNOSIS — E785 Hyperlipidemia, unspecified: Secondary | ICD-10-CM | POA: Diagnosis present

## 2021-07-28 DIAGNOSIS — E876 Hypokalemia: Secondary | ICD-10-CM | POA: Diagnosis present

## 2021-07-28 DIAGNOSIS — T451X5A Adverse effect of antineoplastic and immunosuppressive drugs, initial encounter: Secondary | ICD-10-CM | POA: Diagnosis present

## 2021-07-28 DIAGNOSIS — Z8701 Personal history of pneumonia (recurrent): Secondary | ICD-10-CM

## 2021-07-28 DIAGNOSIS — Z823 Family history of stroke: Secondary | ICD-10-CM

## 2021-07-28 DIAGNOSIS — Z96652 Presence of left artificial knee joint: Secondary | ICD-10-CM | POA: Diagnosis present

## 2021-07-28 DIAGNOSIS — Z923 Personal history of irradiation: Secondary | ICD-10-CM

## 2021-07-28 DIAGNOSIS — K219 Gastro-esophageal reflux disease without esophagitis: Secondary | ICD-10-CM | POA: Diagnosis present

## 2021-07-28 DIAGNOSIS — D6481 Anemia due to antineoplastic chemotherapy: Secondary | ICD-10-CM | POA: Diagnosis present

## 2021-07-28 DIAGNOSIS — Z8249 Family history of ischemic heart disease and other diseases of the circulatory system: Secondary | ICD-10-CM

## 2021-07-28 DIAGNOSIS — J020 Streptococcal pharyngitis: Secondary | ICD-10-CM | POA: Diagnosis not present

## 2021-07-28 LAB — URINALYSIS, ROUTINE W REFLEX MICROSCOPIC
Bilirubin Urine: NEGATIVE
Glucose, UA: NEGATIVE mg/dL
Hgb urine dipstick: NEGATIVE
Ketones, ur: NEGATIVE mg/dL
Leukocytes,Ua: NEGATIVE
Nitrite: NEGATIVE
Protein, ur: NEGATIVE mg/dL
Specific Gravity, Urine: 1.009 (ref 1.005–1.030)
pH: 6 (ref 5.0–8.0)

## 2021-07-28 LAB — COMPREHENSIVE METABOLIC PANEL
ALT: 16 U/L (ref 0–44)
AST: 27 U/L (ref 15–41)
Albumin: 3 g/dL — ABNORMAL LOW (ref 3.5–5.0)
Alkaline Phosphatase: 56 U/L (ref 38–126)
Anion gap: 9 (ref 5–15)
BUN: 16 mg/dL (ref 8–23)
CO2: 21 mmol/L — ABNORMAL LOW (ref 22–32)
Calcium: 9 mg/dL (ref 8.9–10.3)
Chloride: 96 mmol/L — ABNORMAL LOW (ref 98–111)
Creatinine, Ser: 0.71 mg/dL (ref 0.61–1.24)
GFR, Estimated: >60 mL/min (ref >60)
Glucose, Bld: 110 mg/dL — ABNORMAL HIGH (ref 70–99)
Potassium: 3.7 mmol/L (ref 3.5–5.1)
Sodium: 126 mmol/L — ABNORMAL LOW (ref 135–145)
Total Bilirubin: 0.8 mg/dL (ref 0.3–1.2)
Total Protein: 6.8 g/dL (ref 6.5–8.1)

## 2021-07-28 LAB — CBC
HCT: 27.4 % — ABNORMAL LOW (ref 39.0–52.0)
Hemoglobin: 9 g/dL — ABNORMAL LOW (ref 13.0–17.0)
MCH: 30.1 pg (ref 26.0–34.0)
MCHC: 32.8 g/dL (ref 30.0–36.0)
MCV: 91.6 fL (ref 80.0–100.0)
Platelets: 59 10*3/uL — ABNORMAL LOW (ref 150–400)
RBC: 2.99 MIL/uL — ABNORMAL LOW (ref 4.22–5.81)
RDW: 18.6 % — ABNORMAL HIGH (ref 11.5–15.5)
WBC: 6.9 10*3/uL (ref 4.0–10.5)
nRBC: 0 % (ref 0.0–0.2)

## 2021-07-28 LAB — APTT: aPTT: 47 seconds — ABNORMAL HIGH (ref 24–36)

## 2021-07-28 LAB — RESP PANEL BY RT-PCR (FLU A&B, COVID) ARPGX2
Influenza A by PCR: NEGATIVE
Influenza B by PCR: NEGATIVE
SARS Coronavirus 2 by RT PCR: NEGATIVE

## 2021-07-28 LAB — LACTIC ACID, PLASMA
Lactic Acid, Venous: 0.7 mmol/L (ref 0.5–1.9)
Lactic Acid, Venous: 0.5 mmol/L (ref 0.5–1.9)

## 2021-07-28 LAB — PROTIME-INR
INR: 1.6 — ABNORMAL HIGH (ref 0.8–1.2)
Prothrombin Time: 19.1 seconds — ABNORMAL HIGH (ref 11.4–15.2)

## 2021-07-28 MED ORDER — SODIUM CHLORIDE 0.9 % IV SOLN: Freq: Once | INTRAVENOUS | Status: AC

## 2021-07-28 MED ORDER — FENTANYL CITRATE PF 50 MCG/ML IJ SOSY
50.0000 ug | PREFILLED_SYRINGE | Freq: Once | INTRAMUSCULAR | Status: AC
  Administered 2021-07-28: 50 ug via INTRAVENOUS
  Filled 2021-07-28: qty 1

## 2021-07-28 MED ORDER — LACTATED RINGERS IV BOLUS
1000.0000 mL | Freq: Once | INTRAVENOUS | Status: AC
  Administered 2021-07-28: 1000 mL via INTRAVENOUS

## 2021-07-28 MED ORDER — CEFAZOLIN SODIUM-DEXTROSE 2-4 GM/100ML-% IV SOLN
2.0000 g | Freq: Three times a day (TID) | INTRAVENOUS | Status: DC
  Administered 2021-07-28: 2 g via INTRAVENOUS
  Filled 2021-07-28: qty 100

## 2021-07-28 MED ORDER — VANCOMYCIN HCL IN DEXTROSE 1-5 GM/200ML-% IV SOLN
1000.0000 mg | Freq: Once | INTRAVENOUS | Status: AC
  Administered 2021-07-28: 1000 mg via INTRAVENOUS
  Filled 2021-07-28: qty 200

## 2021-07-28 MED ORDER — HYDROCODONE-ACETAMINOPHEN 5-325 MG PO TABS
1.0000 | ORAL_TABLET | Freq: Four times a day (QID) | ORAL | Status: DC | PRN
  Administered 2021-07-28 (×2): 1 via ORAL
  Filled 2021-07-28 (×2): qty 1

## 2021-07-28 MED ORDER — BENZONATATE 100 MG PO CAPS
200.0000 mg | ORAL_CAPSULE | Freq: Three times a day (TID) | ORAL | Status: DC | PRN
  Administered 2021-07-28 – 2021-08-10 (×13): 200 mg via ORAL
  Filled 2021-07-28 (×16): qty 2

## 2021-07-28 MED ORDER — SODIUM CHLORIDE 0.9 % IV SOLN
2.0000 g | Freq: Once | INTRAVENOUS | Status: AC
  Administered 2021-07-28: 2 g via INTRAVENOUS
  Filled 2021-07-28: qty 2

## 2021-07-28 NOTE — ED Notes (Signed)
Pt noted to be short of breath while in triage.

## 2021-07-28 NOTE — Treatment Plan (Signed)
Corey [name] is Corey [name] 75 yo gentleman with hx NSCLC on chemotherapy with Dr. [name] (last chemo appears to have been 2 weeks ago on 10/11 - see note), OSA, atrial fibrillation, CAD and multiple other medical issues who presented to the ED with general malaise and cough.  Notably he was treated for pneumonia 10 days ago and completed Anadia Helmes 5 day course of levaquin.  He had recurrence of cough the last day of abx and notes worsening fatigue, weakness, and cough over past several days.  Painful redness to distal left leg noted as well.  Treated with IVF and abx in the ED, but due to persistent malaise, EDP recommends admission for further w/u and treatment.  CXR without new parenchymal process.  Per EDP, LLE appears c/w cellulitis.  CXR without new parenchymal process.  Vitals notable for transient tachycardia, satting well on RA, afebrile.  Labs notable for hyponatremia, anemia, thrombocytopenia.  Will admit to [facility] under obs.  S/p ancef and IVF in ED.

## 2021-07-28 NOTE — ED Notes (Signed)
Patient's home CPAP unit set up for use at this time.

## 2021-07-28 NOTE — ED Provider Notes (Signed)
[place] EMERGENCY DEPT Provider Note   CSN: [id number] Arrival date & time: 07/28/21  1013     History Chief Complaint  Patient presents with   Fatigue   Shortness of Breath    Corey [name] is a 75 y.o. male.   Shortness of Breath Associated symptoms: cough   Associated symptoms: no abdominal pain, no chest pain, no diaphoresis, no ear pain, no fever, no headaches, no neck pain, no rash, no sore throat, no vomiting and no wheezing   Patient presents for fatigue, cough, left leg swelling and redness.  Medical history notable for non-small cell lung cancer, currently on palliative systemic chemotherapy every 3 weeks.  Last infusions were 2 weeks ago.  He was treated for pneumonia, starting 10 days ago with Levaquin.  He completed his 5-day course of Levaquin.  On the evening of his final day of antibiotics, he had recurrence of cough.  He reports that over the past 5 days, he has had worsening fatigue, generalized weakness, and cough.  He has also developed a painful area of redness to his anterior distal left leg.     Past Medical History:  Diagnosis Date   Anemia    Anxiety    Arthritis    "back, right knee, hands, ankles, neck" (05/31/2016)   Carotid artery disease ([place]) 08/30/2016   Carotid US 11/19: R 1-39, L 40-59 // Carotid US 08/2019: R 1-39; L 40-59 // Carotid US 11/21: R 1-39; L 40-59>> repeat 1 year    Chronic lower back pain    Coronary atherosclerosis of native coronary artery    a. BMS to [facility] 2004 and 2007, otherwise mild nonobstructive disease. EF normal.   Diverticulitis    Dyslipidemia    Dyspnea    Essential hypertension, benign    Family history of breast cancer    Family history of lung cancer    Family history of prostate cancer    GERD (gastroesophageal reflux disease)    Lumbar radiculopathy, chronic 02/04/2015   Right L5   Obesity    OSA on CPAP    uses cpap   Paroxysmal atrial fibrillation ([place])    a. Discovered after stroke.    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   COPD with acute exacerbation ([place]) 04/02/2021   Cough with hemoptysis 04/01/2021   CAP (community acquired pneumonia) 04/01/2021   Family history of prostate cancer 03/20/2021   Family history of breast cancer 03/20/2021   Family history of lung cancer 03/20/2021   Goals of care, counseling/discussion 03/18/2021   Encounter for antineoplastic chemotherapy 03/18/2021   Encounter for antineoplastic immunotherapy 03/18/2021   Primary cancer of left lower lobe of lung ([place]) 03/07/2021   Non-small cell carcinoma of left lung, stage 4 ([place]) 03/05/2021   Malnutrition of moderate degree 02/25/2021   Pleural effusion on left 02/24/2021   Hilar mass    Reactive airway disease 11/20/2020   Allergic rhinitis with non-allergic component 09/18/2020   Heartburn 09/18/2020   Coughing 09/18/2020   Malignant neoplasm of prostate ([place]) 03/04/2020  Pre-syncope 02/27/2020   Cerebral embolism with transient ischemic attack (TIA) 02/11/2020   History of stroke 52/84/1324   Embolic stroke involving cerebral artery ([place]) 02/11/2020   Secondary hypercoagulable state ([place]) 09/10/2019   Essential tremor 06/25/2019   Chest pain 05/27/2017   Demand ischemia ([place]) 05/27/2017   Morbid obesity ([place]) 05/27/2017   Carotid artery disease ([place]) 08/30/2016   Essential hypertension 08/30/2016   Diverticulitis of intestine without perforation or abscess without bleeding    Hyponatremia 06/04/2016   Diverticulitis 05/31/2016   Iron deficiency 01/05/2016   Lumbar radiculopathy,  chronic 02/04/2015   SVT (supraventricular tachycardia) ([place]) 12/03/2014   PAC (premature atrial contraction) 04/05/2014   OSA on CPAP 03/27/2014   AKI (acute kidney injury) ([place]) 12/05/2013   Nausea 12/05/2013   Sepsis ([place]) 12/05/2013   Abdominal pain 12/05/2013   AF (paroxysmal atrial fibrillation) ([place]) 01/05/2013   CVA (cerebral infarction) 07/06/2012   Coronary artery disease    Benign essential HTN    Dyslipidemia    GERD (gastroesophageal reflux disease)     Past Surgical History:  Procedure Laterality Date   ADENOIDECTOMY     ANTERIOR CERVICAL DECOMP/DISCECTOMY FUSION  07/2001; 10/2002   "C5-6; C6-7; redo"   BACK SURGERY     CARPAL TUNNEL RELEASE Left 10/2015   CHEST TUBE INSERTION Left 03/17/2021   Procedure: INSERTION PLEURAL DRAINAGE CATHETER;  Surgeon: Garner Nash, DO;  Location: [place];  Service: Pulmonary;  Laterality: Left;  Indwelling Tunneled Pleural catheter (PLEUREX)    COLONOSCOPY W/ POLYPECTOMY  02/2014   CORONARY ANGIOPLASTY WITH STENT PLACEMENT  05/2003; 12/2005   "mid RCA; mid RCA"   FINE NEEDLE ASPIRATION  02/26/2021   Procedure: FINE NEEDLE ASPIRATION (FNA) LINEAR;  Surgeon: Candee Furbish, MD;  Location[facility] ENDOSCOPY;  Service: Pulmonary;;   HAND SURGERY  02/2019   LEFT HAND   implantable loop recorder placement  02/27/2020   [employer]onic Reveal [place] model [id number]  implantable loop recorder implanted by Dr Rayann Heman for afib management and evaluation of presyncope   IR IMAGING GUIDED PORT INSERTION  04/16/2021   JOINT REPLACEMENT     KNEE ARTHROSCOPY Left 10/2005   KNEE ARTHROSCOPY W/ PARTIAL MEDIAL MENISCECTOMY Left 09/2005   LUMBAR LAMINECTOMY/DECOMPRESSION MICRODISCECTOMY  03/2005   "L4-5"   POSTERIOR LUMBAR FUSION  10/2003   L5-S1; "plates, screws"   SHOULDER ARTHROSCOPY Right 08/2011   Debridement of labrum, arthroscopic distal clavicle excision   SHOULDER OPEN ROTATOR CUFF REPAIR Left 07/2014   TEE WITHOUT CARDIOVERSION  07/07/2012    Procedure: TRANSESOPHAGEAL ECHOCARDIOGRAM (TEE);  Surgeon: Fay Records, MD;  Location: [facility] ENDOSCOPY;  Service: Cardiovascular;  Laterality: N/A;   THORACENTESIS N/A 02/25/2021   Procedure: Mathews Robinsons;  Surgeon: Juanito Doom, MD;  Location: [place];  Service: Cardiopulmonary;  Laterality: N/A;   TONSILLECTOMY AND ADENOIDECTOMY  ~ 1956   TOTAL KNEE ARTHROPLASTY Left 10/2006   TRIGGER FINGER RELEASE Left 10/2015   VIDEO BRONCHOSCOPY WITH ENDOBRONCHIAL ULTRASOUND Left 02/26/2021   Procedure: VIDEO BRONCHOSCOPY WITH ENDOBRONCHIAL ULTRASOUND;  Surgeon: Candee Furbish, MD;  Location: [facility] ENDOSCOPY;  Service: Pulmonary;  Laterality: Left;  cryoprobe too thanks!       Family History  Problem Relation Age of Onset   Hypertension Mother    Heart disease Father    Heart attack Father    Prostate cancer Brother 30   Parkinson's disease Brother    Lung cancer Paternal Aunt        hx of  smoking   Breast cancer Paternal Grandmother        dx 69s, bilateral mastectomies   Heart attack Paternal Grandfather    Blindness Son    Colon cancer Neg Hx    Pancreatic cancer Neg Hx     Social History   Tobacco Use   Smoking status: Former    Packs/day: 1.00    Years: 34.00    Pack years: 34.00    Types: Cigarettes    Quit date: 05/05/2003    Years since quitting: 18.2    Passive exposure: Never   Smokeless tobacco: Never  Vaping Use   Vaping Use: Never used  Substance Use Topics   Alcohol use: No    Alcohol/week: 0.0 standard drinks   Drug use: No    Home Medications Prior to Admission medications   Medication Sig Start Date End Date Taking? Authorizing Provider  acetaminophen (TYLENOL 8 HOUR ARTHRITIS PAIN) 650 MG CR tablet Take 1,300 mg by mouth every 8 (eight) hours as needed for pain (pain).   Yes [provider]  albuterol (VENTOLIN HFA) 108 (90 Base) MCG/ACT inhaler Inhale 2 puffs into the lungs every 4 (four) hours as needed for wheezing or shortness of breath.  Inhale 2 puffs into the lungs every 4 hours as needed 04/04/21  Yes Rai, Ripudeep K, MD  ALPRAZolam (XANAX) 0.5 MG tablet Take 1 tablet (0.5 mg total) by mouth at bedtime as needed for anxiety. 05/20/21  Yes Pahwani, Einar Grad, MD  atorvastatin (LIPITOR) 40 MG tablet Take 1 tablet (40 mg total) by mouth daily. 01/21/21  Yes Nahser, Wonda Cheng, MD  benzonatate (TESSALON) 200 MG capsule TAKE 1 CAPSULE (200 MG TOTAL) BY MOUTH 3 (THREE) TIMES DAILY AS NEEDED FOR COUGH. 07/10/21  Yes Heilingoetter, Cassandra L, PA-C  carbidopa-levodopa (SINEMET IR) 25-100 MG tablet Take 1 tablet by mouth 3 (three) times daily. Patient taking differently: Take 1 tablet by mouth 3 (three) times daily. 0800, 1200, 1600 07/09/21  Yes Tat, Eustace Quail, DO  Carbinoxamine Maleate 6 MG TABS Take 1 tablet by mouth 2 (two) times daily as needed (drainage). 03/24/21  Yes Garnet Sierras, DO  chlorpheniramine-HYDROcodone (TUSSIONEX) 10-8 MG/5ML SUER Take 5 mLs by mouth every 12 (twelve) hours as needed for cough. 07/03/21  Yes Martyn Ehrich, NP  dofetilide (TIKOSYN) 500 MCG capsule Take 1 capsule (500 mcg total) by mouth 2 (two) times daily. Please keep upcoming appointment in February for future refills. Thank you 01/21/21  Yes Nahser, Wonda Cheng, MD  fenofibrate 160 MG tablet TAKE 1 TABLET DAILY. PLEASE KEEP UPCOMING APPT IN MARCH WITH DR. Acie Fredrickson BEFORE ANYMORE REFILLS. Patient taking differently: Take 160 mg by mouth daily. 01/21/21  Yes Nahser, Wonda Cheng, MD  fluticasone (FLONASE) 50 MCG/ACT nasal spray Place 2 sprays into both nostrils daily. 09/18/20  Yes Garnet Sierras, DO  folic acid (FOLVITE) 1 MG tablet TAKE 1 TABLET BY MOUTH EVERY DAY Patient taking differently: Take 1 mg by mouth daily. 06/14/21  Yes Heilingoetter, Cassandra L, PA-C  ipratropium (ATROVENT) 0.03 % nasal spray Place 1-2 sprays into both nostrils every 12 (twelve) hours. For drainage 03/24/21  Yes Garnet Sierras, DO  lidocaine-prilocaine (EMLA) cream Apply 1 application topically as  needed. Patient taking differently: Apply 1 application topically as needed (treatment). 04/07/21  Yes Heilingoetter, Cassandra L, PA-C  magnesium oxide (MAG-OX) 400 (240 Mg) MG tablet TAKE 1 TABLET BY MOUTH EVERY DAY Patient taking differently: Take 400 mg by mouth daily. 06/03/21  Yes Fenton, Clint R, PA  metoprolol succinate (TOPROL-XL) 25 MG 24 hr tablet TAKE 1 TABLET BY MOUTH EVERY DAY Patient taking differently: Take 25 mg by mouth daily. 07/13/21  Yes AllredJeneen Rinks, MD  Multiple Vitamins-Minerals (MULTIVITAMINS THER. W/MINERALS) TABS Take 1 tablet by mouth daily.   Yes [provider]  omeprazole (PRILOSEC) 20 MG capsule Take 1 capsule (20 mg total) by mouth daily. 03/24/21  Yes Garnet Sierras, DO  ondansetron (ZOFRAN) 8 MG tablet Take 1 tablet (8 mg total) by mouth every 8 (eight) hours as needed for nausea or vomiting. Starting 3 days after chemotherapy 05/11/21  Yes Curt Bears, MD  potassium chloride (KLOR-CON) 10 MEQ tablet TAKE 1 TABLET BY MOUTH EVERY DAY Patient taking differently: Take 10 mEq by mouth daily. 07/16/21  Yes Nahser, Wonda Cheng, MD  Saccharomyces boulardii (PROBIOTIC) 250 MG CAPS Take 250 mg by mouth daily.   Yes [provider]  tamsulosin (FLOMAX) 0.4 MG CAPS capsule Take 0.4 mg by mouth daily after supper.   Yes [provider]  Tiotropium Bromide-Olodaterol (STIOLTO RESPIMAT) 2.5-2.5 MCG/ACT AERS Inhale 2 puffs into the lungs daily. 04/04/21  Yes Rai, Ripudeep K, MD  topiramate (TOPAMAX) 50 MG tablet Take 2 tablets (100 mg total) by mouth 2 (two) times daily. 04/13/21  Yes Frann Rider, NP  traZODone (DESYREL) 50 MG tablet Take 0.5 tablets (25 mg total) by mouth at bedtime as needed for sleep. 09/22/20  Yes McCue, Janett Billow, NP  Vitamin D, Ergocalciferol, (DRISDOL) 1.25 MG (50000 UNIT) CAPS capsule TAKE 1 CAPSULE BY MOUTH ONE TIME PER WEEK Patient taking differently: Take 50,000 Units by mouth every 7 (seven) days. Monday 03/19/21  Yes Kale, Cloria Spring, MD  XARELTO 20 MG TABS tablet TAKE 1 TABLET BY MOUTH EVERY DAY Patient taking differently: 20 mg daily with supper. 10/06/20  Yes Nahser, Wonda Cheng, MD  diclofenac Sodium (VOLTAREN) 1 % GEL Apply 4 g topically 4 (four) times daily. Patient not taking: No sig reported 02/27/21   Cristal Ford, DO  loperamide (IMODIUM) 2 MG capsule Take 2 mg by mouth daily as needed for diarrhea or loose stools.    [provider]  nitroGLYCERIN (NITROSTAT) 0.4 MG SL tablet Place 1 tablet (0.4 mg total) under the tongue every 5 (five) minutes as needed for chest pain. 01/21/21   Nahser, Wonda Cheng, MD  promethazine (PHENERGAN) 25 MG tablet Take 1 tablet (25 mg total) by mouth every 6 (six) hours as needed for nausea or vomiting. 03/25/21   Curt Bears, MD  solifenacin (VESICARE) 5 MG tablet Take 5 mg by mouth at bedtime.    [provider]  traMADol (ULTRAM) 50 MG tablet Take 1 tablet (50 mg total) by mouth every 6 (six) hours as needed for moderate pain. 05/20/21   Darliss Cheney, MD    Allergies    Amoxicillin, Fosinopril, Hydromorphone, Ampicillin, Monopril [fosinopril sodium], Other, and Rosuvastatin  Review of Systems   Review of Systems  Constitutional:  Positive for activity change and fatigue. Negative for chills, diaphoresis and fever.  HENT:  Negative for congestion, ear pain, sinus pain and sore throat.   Eyes:  Negative for pain and visual disturbance.  Respiratory:  Positive for cough and shortness of breath. Negative for chest tightness, wheezing and stridor.   Cardiovascular:  Negative for chest pain and palpitations.  Gastrointestinal:  Negative for abdominal pain, nausea and vomiting.  Genitourinary:  Negative for dysuria, flank pain and hematuria.  Musculoskeletal:  Negative  for arthralgias, back pain, myalgias and neck pain.  Skin:  Positive for color change. Negative for rash.  Neurological:  Positive for weakness (generalized). Negative for dizziness, seizures,  syncope, facial asymmetry, speech difficulty, numbness and headaches.  Hematological:  Bruises/bleeds easily (On Xarelto).  Psychiatric/Behavioral:  Negative for confusion and decreased concentration.   All other systems reviewed and are negative.  Physical Exam Updated Vital Signs BP 132/80   Pulse 92   Temp 98.2 F (36.8 C) (Oral)   Resp 19   Ht 6' 1.5" (1.867 m)   Wt 108 kg   SpO2 94%   BMI 30.97 kg/m   Physical Exam Vitals and nursing note reviewed.  Constitutional:      General: He is not in acute distress.    Appearance: He is well-developed and normal weight. He is not ill-appearing, toxic-appearing or diaphoretic.  HENT:     Head: Normocephalic and atraumatic.     Mouth/Throat:     Mouth: Mucous membranes are moist.     Pharynx: Oropharynx is clear.  Eyes:     Conjunctiva/sclera: Conjunctivae normal.  Cardiovascular:     Rate and Rhythm: Normal rate and regular rhythm.     Heart sounds: No murmur heard. Pulmonary:     Effort: Pulmonary effort is normal. No tachypnea, accessory muscle usage or respiratory distress.     Breath sounds: Normal breath sounds. No decreased breath sounds, wheezing or rales.  Chest:     Chest wall: No tenderness.  Abdominal:     Palpations: Abdomen is soft.     Tenderness: There is no abdominal tenderness.  Musculoskeletal:     Cervical back: Neck supple.     Left lower leg: Tenderness present.  Skin:    General: Skin is warm and dry.     Findings: Erythema present.  Neurological:     General: No focal deficit present.     Mental Status: He is alert and oriented to person, place, and time.     Cranial Nerves: No cranial nerve deficit.     Motor: No weakness.  Psychiatric:        Mood and Affect: Mood normal.        Behavior: Behavior normal.     ED Results / Procedures / Treatments   Labs (all labs ordered are listed, but only abnormal results are displayed) Labs Reviewed  CBC - Abnormal; Notable for the following  components:      Result Value   RBC 2.99 (*)    Hemoglobin 9.0 (*)    HCT 27.4 (*)    RDW 18.6 (*)    Platelets 59 (*)    All other components within normal limits  COMPREHENSIVE METABOLIC PANEL - Abnormal; Notable for the following components:   Sodium 126 (*)    Chloride 96 (*)    CO2 21 (*)    Glucose, Bld 110 (*)    Albumin 3.0 (*)    All other components within normal limits  PROTIME-INR - Abnormal; Notable for the following components:   Prothrombin Time 19.1 (*)    INR 1.6 (*)    All other components within normal limits  APTT - Abnormal; Notable for the following components:   aPTT 47 (*)    All other components within normal limits  RESP PANEL BY RT-PCR (FLU A&B, COVID) ARPGX2  CULTURE, BLOOD (SINGLE)  CULTURE, BLOOD (ROUTINE X 2)  LACTIC ACID, PLASMA  LACTIC ACID, PLASMA  URINALYSIS, ROUTINE W REFLEX MICROSCOPIC  EKG EKG Interpretation  Date/Time:  Tuesday July 28 2021 11:31:26 EDT Ventricular Rate:  88 PR Interval:  186 QRS Duration: 102 QT Interval:  438 QTC Calculation: 530 R Axis:   57 Text Interpretation: Sinus rhythm Probable left atrial enlargement Low voltage, precordial leads Consider anterior infarct Prolonged QT interval Confirmed by Godfrey Pick [phone]) on 07/28/2021 1:31:49 PM  Radiology DG Chest Port 1 View  Result Date: 07/28/2021 CLINICAL DATA:  Shortness of breath and cough EXAM: PORTABLE CHEST 1 VIEW COMPARISON:  Chest x-ray dated July 18, 2021 FINDINGS: Cardiac and mediastinal contours are within normal limits. Unchanged position of right chest wall PICC. Unchanged left lung volume loss with mid and lower lung opacities. No evidence of large pleural region or pneumothorax. No new parenchymal process. IMPRESSION: No new parenchymal process. Unchanged left lower lobe opacity. Electronically Signed   By: Yetta Glassman M.D.   On: 07/28/2021 12:05    Procedures Procedures   Medications Ordered in ED Medications   HYDROcodone-acetaminophen (NORCO/VICODIN) 5-325 MG per tablet 1-2 tablet (1 tablet Oral Given 07/28/21 1609)  lactated ringers bolus 1,000 mL (0 mLs Intravenous Stopped 07/28/21 1534)  fentaNYL (SUBLIMAZE) injection 50 mcg (50 mcg Intravenous Given 07/28/21 1412)  0.9 %  sodium chloride infusion ( Intravenous New Bag/Given 07/28/21 1612)  vancomycin (VANCOCIN) IVPB 1000 mg/200 mL premix (1,000 mg Intravenous New Bag/Given 07/28/21 1700)  ceFEPIme (MAXIPIME) 2 g in sodium chloride 0.9 % 100 mL IVPB (0 g Intravenous Stopped 07/28/21 1701)    ED Course  I have reviewed the triage vital signs and the nursing notes.  Pertinent labs & imaging results that were available during my care of the patient were reviewed by me and considered in my medical decision making (see chart for details).    MDM Rules/Calculators/A&P                          Patient is a 75 year old male with current lung cancer, currently on chemotherapy, presenting for persistent cough, as well as worsening fatigue, generalized weakness, and a new onset of left leg cellulitis.  He is afebrile upon arrival.  He denies any recent fevers at home.  Lungs are clear to auscultation.  Breathing, while at rest, is unlabored.  Patient does appear to have truncal weakness and is difficult for him to sit up in bed.  He has no focal neurologic deficits.  Skin findings of left lower extremity are consistent with cellulitis.  Laboratory work-up was initiated.  Bolus of IV fluids was given.  Norco was given for pain.  Patient has pain in his left leg but denies any other areas of discomfort at this time.  Initially Ancef was ordered for treatment of cellulitis.  Given his comorbidities, antibiotic therapy was escalated to vancomycin and cefepime.  Chest x-ray showed no new findings.  There is the persistent opacity from his previous pneumonia.  Given his blood thinner use, low suspicion for PE.  Persistent cough may be secondary to progression of  lung cancer and/or recurrence of pneumonia.  Following IV fluids and pain control, patient continued to endorse fatigue and generalized weakness.  Patient to be admitted for ongoing care. Final Clinical Impression(s) / ED Diagnoses Final diagnoses:  Cellulitis of left lower extremity    Rx / DC Orders ED Discharge Orders     None        Godfrey Pick, MD 07/28/21 [phone]

## 2021-07-28 NOTE — ED Triage Notes (Signed)
Lung CA pt on chemo, last dose 2 weeks ago, hx of anemia. Pt c/o fatigue, cough, redness and swelling to LLE.

## 2021-07-29 ENCOUNTER — Encounter (HOSPITAL_COMMUNITY): Payer: Self-pay | Admitting: Family Medicine

## 2021-07-29 ENCOUNTER — Ambulatory Visit: Payer: Medicare PPO | Admitting: Adult Health

## 2021-07-29 ENCOUNTER — Telehealth: Payer: Self-pay | Admitting: Medical Oncology

## 2021-07-29 DIAGNOSIS — R0902 Hypoxemia: Secondary | ICD-10-CM | POA: Diagnosis not present

## 2021-07-29 DIAGNOSIS — J189 Pneumonia, unspecified organism: Secondary | ICD-10-CM | POA: Diagnosis not present

## 2021-07-29 DIAGNOSIS — M329 Systemic lupus erythematosus, unspecified: Secondary | ICD-10-CM | POA: Diagnosis present

## 2021-07-29 DIAGNOSIS — E669 Obesity, unspecified: Secondary | ICD-10-CM | POA: Diagnosis present

## 2021-07-29 DIAGNOSIS — J9601 Acute respiratory failure with hypoxia: Secondary | ICD-10-CM | POA: Diagnosis not present

## 2021-07-29 DIAGNOSIS — R0602 Shortness of breath: Secondary | ICD-10-CM | POA: Diagnosis present

## 2021-07-29 DIAGNOSIS — R112 Nausea with vomiting, unspecified: Secondary | ICD-10-CM | POA: Diagnosis not present

## 2021-07-29 DIAGNOSIS — H538 Other visual disturbances: Secondary | ICD-10-CM | POA: Diagnosis present

## 2021-07-29 DIAGNOSIS — I480 Paroxysmal atrial fibrillation: Secondary | ICD-10-CM | POA: Diagnosis not present

## 2021-07-29 DIAGNOSIS — E785 Hyperlipidemia, unspecified: Secondary | ICD-10-CM | POA: Diagnosis present

## 2021-07-29 DIAGNOSIS — G8929 Other chronic pain: Secondary | ICD-10-CM | POA: Diagnosis present

## 2021-07-29 DIAGNOSIS — J910 Malignant pleural effusion: Secondary | ICD-10-CM | POA: Diagnosis present

## 2021-07-29 DIAGNOSIS — L03116 Cellulitis of left lower limb: Secondary | ICD-10-CM

## 2021-07-29 DIAGNOSIS — G20 Parkinson's disease: Secondary | ICD-10-CM | POA: Diagnosis present

## 2021-07-29 DIAGNOSIS — Z66 Do not resuscitate: Secondary | ICD-10-CM | POA: Diagnosis present

## 2021-07-29 DIAGNOSIS — J704 Drug-induced interstitial lung disorders, unspecified: Secondary | ICD-10-CM | POA: Diagnosis present

## 2021-07-29 DIAGNOSIS — E871 Hypo-osmolality and hyponatremia: Secondary | ICD-10-CM | POA: Diagnosis present

## 2021-07-29 DIAGNOSIS — C787 Secondary malignant neoplasm of liver and intrahepatic bile duct: Secondary | ICD-10-CM | POA: Diagnosis present

## 2021-07-29 DIAGNOSIS — J9811 Atelectasis: Secondary | ICD-10-CM | POA: Diagnosis present

## 2021-07-29 DIAGNOSIS — D6481 Anemia due to antineoplastic chemotherapy: Secondary | ICD-10-CM | POA: Diagnosis present

## 2021-07-29 DIAGNOSIS — I700 Atherosclerosis of aorta: Secondary | ICD-10-CM | POA: Diagnosis present

## 2021-07-29 DIAGNOSIS — Z9981 Dependence on supplemental oxygen: Secondary | ICD-10-CM | POA: Diagnosis not present

## 2021-07-29 DIAGNOSIS — J690 Pneumonitis due to inhalation of food and vomit: Secondary | ICD-10-CM | POA: Diagnosis not present

## 2021-07-29 DIAGNOSIS — F4323 Adjustment disorder with mixed anxiety and depressed mood: Secondary | ICD-10-CM | POA: Diagnosis not present

## 2021-07-29 DIAGNOSIS — Z20822 Contact with and (suspected) exposure to covid-19: Secondary | ICD-10-CM | POA: Diagnosis present

## 2021-07-29 DIAGNOSIS — C3492 Malignant neoplasm of unspecified part of left bronchus or lung: Secondary | ICD-10-CM | POA: Diagnosis not present

## 2021-07-29 DIAGNOSIS — C3432 Malignant neoplasm of lower lobe, left bronchus or lung: Secondary | ICD-10-CM | POA: Diagnosis present

## 2021-07-29 DIAGNOSIS — D649 Anemia, unspecified: Secondary | ICD-10-CM | POA: Diagnosis not present

## 2021-07-29 DIAGNOSIS — I10 Essential (primary) hypertension: Secondary | ICD-10-CM | POA: Diagnosis present

## 2021-07-29 DIAGNOSIS — C3490 Malignant neoplasm of unspecified part of unspecified bronchus or lung: Secondary | ICD-10-CM | POA: Diagnosis not present

## 2021-07-29 DIAGNOSIS — M549 Dorsalgia, unspecified: Secondary | ICD-10-CM | POA: Diagnosis not present

## 2021-07-29 DIAGNOSIS — F419 Anxiety disorder, unspecified: Secondary | ICD-10-CM | POA: Diagnosis present

## 2021-07-29 DIAGNOSIS — D849 Immunodeficiency, unspecified: Secondary | ICD-10-CM | POA: Diagnosis present

## 2021-07-29 DIAGNOSIS — J020 Streptococcal pharyngitis: Secondary | ICD-10-CM | POA: Diagnosis not present

## 2021-07-29 DIAGNOSIS — R5381 Other malaise: Secondary | ICD-10-CM | POA: Diagnosis present

## 2021-07-29 DIAGNOSIS — R0603 Acute respiratory distress: Secondary | ICD-10-CM | POA: Diagnosis not present

## 2021-07-29 DIAGNOSIS — J9621 Acute and chronic respiratory failure with hypoxia: Secondary | ICD-10-CM | POA: Diagnosis present

## 2021-07-29 LAB — COMPREHENSIVE METABOLIC PANEL
ALT: 15 U/L (ref 0–44)
AST: 27 U/L (ref 15–41)
Albumin: 2.2 g/dL — ABNORMAL LOW (ref 3.5–5.0)
Alkaline Phosphatase: 49 U/L (ref 38–126)
Anion gap: 9 (ref 5–15)
BUN: 14 mg/dL (ref 8–23)
CO2: 21 mmol/L — ABNORMAL LOW (ref 22–32)
Calcium: 8.3 mg/dL — ABNORMAL LOW (ref 8.9–10.3)
Chloride: 99 mmol/L (ref 98–111)
Creatinine, Ser: 0.73 mg/dL (ref 0.61–1.24)
GFR, Estimated: >60 mL/min (ref >60)
Glucose, Bld: 109 mg/dL — ABNORMAL HIGH (ref 70–99)
Potassium: 3.4 mmol/L — ABNORMAL LOW (ref 3.5–5.1)
Sodium: 129 mmol/L — ABNORMAL LOW (ref 135–145)
Total Bilirubin: 0.8 mg/dL (ref 0.3–1.2)
Total Protein: 6.4 g/dL — ABNORMAL LOW (ref 6.5–8.1)

## 2021-07-29 LAB — MAGNESIUM: Magnesium: 1.6 mg/dL — ABNORMAL LOW (ref 1.7–2.4)

## 2021-07-29 LAB — CBC WITH DIFFERENTIAL/PLATELET
Abs Immature Granulocytes: 0.13 10*3/uL — ABNORMAL HIGH (ref 0.00–0.07)
Basophils Absolute: 0 10*3/uL (ref 0.0–0.1)
Basophils Relative: 0 %
Eosinophils Absolute: 0 10*3/uL (ref 0.0–0.5)
Eosinophils Relative: 1 %
HCT: 25.1 % — ABNORMAL LOW (ref 39.0–52.0)
Hemoglobin: 8.1 g/dL — ABNORMAL LOW (ref 13.0–17.0)
Immature Granulocytes: 2 %
Lymphocytes Relative: 2 %
Lymphs Abs: 0.2 10*3/uL — ABNORMAL LOW (ref 0.7–4.0)
MCH: 31 pg (ref 26.0–34.0)
MCHC: 32.3 g/dL (ref 30.0–36.0)
MCV: 96.2 fL (ref 80.0–100.0)
Monocytes Absolute: 0.8 10*3/uL (ref 0.1–1.0)
Monocytes Relative: 11 %
Neutro Abs: 5.9 10*3/uL (ref 1.7–7.7)
Neutrophils Relative %: 84 %
Platelets: 73 10*3/uL — ABNORMAL LOW (ref 150–400)
RBC: 2.61 MIL/uL — ABNORMAL LOW (ref 4.22–5.81)
RDW: 19 % — ABNORMAL HIGH (ref 11.5–15.5)
WBC: 7 10*3/uL (ref 4.0–10.5)
nRBC: 0 % (ref 0.0–0.2)

## 2021-07-29 MED ORDER — ARFORMOTEROL TARTRATE 15 MCG/2ML IN NEBU
15.0000 ug | INHALATION_SOLUTION | Freq: Two times a day (BID) | RESPIRATORY_TRACT | Status: DC
  Administered 2021-07-29 – 2021-08-12 (×28): 15 ug via RESPIRATORY_TRACT
  Filled 2021-07-29 (×28): qty 2

## 2021-07-29 MED ORDER — SODIUM CHLORIDE 0.9 % IV SOLN: Freq: Once | INTRAVENOUS | Status: AC

## 2021-07-29 MED ORDER — TRAMADOL HCL 50 MG PO TABS: 50.0000 mg | ORAL_TABLET | Freq: Four times a day (QID) | ORAL | Status: DC | PRN

## 2021-07-29 MED ORDER — HYDROCOD POLST-CPM POLST ER 10-8 MG/5ML PO SUER
5.0000 mL | Freq: Two times a day (BID) | ORAL | Status: DC | PRN
  Administered 2021-08-01 – 2021-08-11 (×8): 5 mL via ORAL
  Filled 2021-07-29 (×8): qty 5

## 2021-07-29 MED ORDER — SODIUM CHLORIDE 0.9 % IV SOLN
2.0000 g | INTRAVENOUS | Status: DC
  Administered 2021-07-29 – 2021-08-04 (×7): 2 g via INTRAVENOUS
  Filled 2021-07-29 (×7): qty 20

## 2021-07-29 MED ORDER — RISAQUAD PO CAPS
ORAL_CAPSULE | Freq: Every day | ORAL | Status: DC
  Administered 2021-07-29 – 2021-08-10 (×11): 1 via ORAL
  Filled 2021-07-29 (×14): qty 1

## 2021-07-29 MED ORDER — UMECLIDINIUM BROMIDE 62.5 MCG/ACT IN AEPB
1.0000 | INHALATION_SPRAY | Freq: Every day | RESPIRATORY_TRACT | Status: DC
  Administered 2021-07-29 – 2021-07-31 (×2): 1 via RESPIRATORY_TRACT
  Filled 2021-07-29: qty 7

## 2021-07-29 MED ORDER — ONDANSETRON HCL 4 MG/2ML IJ SOLN
4.0000 mg | Freq: Four times a day (QID) | INTRAMUSCULAR | Status: DC | PRN
  Administered 2021-07-29 – 2021-08-08 (×5): 4 mg via INTRAVENOUS
  Filled 2021-07-29 (×6): qty 2

## 2021-07-29 MED ORDER — FENOFIBRATE 160 MG PO TABS
160.0000 mg | ORAL_TABLET | Freq: Every day | ORAL | Status: DC
  Administered 2021-07-29 – 2021-08-12 (×14): 160 mg via ORAL
  Filled 2021-07-29 (×15): qty 1

## 2021-07-29 MED ORDER — VANCOMYCIN HCL 2000 MG/400ML IV SOLN
2000.0000 mg | Freq: Once | INTRAVENOUS | Status: AC
  Administered 2021-07-29: 2000 mg via INTRAVENOUS
  Filled 2021-07-29: qty 400

## 2021-07-29 MED ORDER — MAGNESIUM OXIDE -MG SUPPLEMENT 400 (240 MG) MG PO TABS: 400.0000 mg | ORAL_TABLET | Freq: Two times a day (BID) | ORAL | Status: DC

## 2021-07-29 MED ORDER — ONDANSETRON HCL 4 MG PO TABS: 4.0000 mg | ORAL_TABLET | Freq: Four times a day (QID) | ORAL | Status: DC | PRN

## 2021-07-29 MED ORDER — POTASSIUM CHLORIDE CRYS ER 20 MEQ PO TBCR
40.0000 meq | EXTENDED_RELEASE_TABLET | Freq: Every day | ORAL | Status: DC
  Administered 2021-07-29: 40 meq via ORAL
  Filled 2021-07-29: qty 2

## 2021-07-29 MED ORDER — ACETAMINOPHEN 325 MG PO TABS
650.0000 mg | ORAL_TABLET | Freq: Four times a day (QID) | ORAL | Status: DC | PRN
  Administered 2021-07-29: 650 mg via ORAL
  Filled 2021-07-29: qty 2

## 2021-07-29 MED ORDER — POTASSIUM CHLORIDE CRYS ER 20 MEQ PO TBCR: 40.0000 meq | EXTENDED_RELEASE_TABLET | Freq: Every day | ORAL | Status: DC

## 2021-07-29 MED ORDER — PANTOPRAZOLE SODIUM 40 MG PO TBEC
40.0000 mg | DELAYED_RELEASE_TABLET | Freq: Every day | ORAL | Status: DC
  Administered 2021-07-29: 40 mg via ORAL
  Filled 2021-07-29: qty 1

## 2021-07-29 MED ORDER — ALPRAZOLAM 0.5 MG PO TABS
0.5000 mg | ORAL_TABLET | Freq: Every evening | ORAL | Status: DC | PRN
  Administered 2021-07-29 – 2021-08-11 (×13): 0.5 mg via ORAL
  Filled 2021-07-29 (×14): qty 1

## 2021-07-29 MED ORDER — VANCOMYCIN HCL IN DEXTROSE 1-5 GM/200ML-% IV SOLN
1000.0000 mg | Freq: Two times a day (BID) | INTRAVENOUS | Status: DC
  Administered 2021-07-29 – 2021-07-31 (×4): 1000 mg via INTRAVENOUS
  Filled 2021-07-29 (×4): qty 200

## 2021-07-29 MED ORDER — DOFETILIDE 250 MCG PO CAPS
500.0000 ug | ORAL_CAPSULE | Freq: Two times a day (BID) | ORAL | Status: DC
  Administered 2021-07-29 – 2021-08-12 (×28): 500 ug via ORAL
  Filled 2021-07-29 (×30): qty 2

## 2021-07-29 MED ORDER — ALBUTEROL SULFATE (2.5 MG/3ML) 0.083% IN NEBU: 3.0000 mL | INHALATION_SOLUTION | RESPIRATORY_TRACT | Status: DC | PRN

## 2021-07-29 MED ORDER — FOLIC ACID 1 MG PO TABS
1.0000 mg | ORAL_TABLET | Freq: Every day | ORAL | Status: DC
  Administered 2021-07-29: 1 mg via ORAL
  Filled 2021-07-29: qty 1

## 2021-07-29 MED ORDER — MAGNESIUM SULFATE 4 GM/100ML IV SOLN
4.0000 g | Freq: Once | INTRAVENOUS | Status: AC
  Administered 2021-07-29: 4 g via INTRAVENOUS
  Filled 2021-07-29: qty 100

## 2021-07-29 MED ORDER — CHLORHEXIDINE GLUCONATE 0.12 % MT SOLN
15.0000 mL | Freq: Two times a day (BID) | OROMUCOSAL | Status: DC
  Administered 2021-07-29 – 2021-08-12 (×26): 15 mL via OROMUCOSAL
  Filled 2021-07-29 (×26): qty 15

## 2021-07-29 MED ORDER — TOPIRAMATE 100 MG PO TABS
100.0000 mg | ORAL_TABLET | Freq: Two times a day (BID) | ORAL | Status: DC
  Administered 2021-07-29 – 2021-08-12 (×28): 100 mg via ORAL
  Filled 2021-07-29 (×28): qty 1

## 2021-07-29 MED ORDER — ATORVASTATIN CALCIUM 40 MG PO TABS
40.0000 mg | ORAL_TABLET | Freq: Every day | ORAL | Status: DC
  Administered 2021-07-29 – 2021-08-12 (×14): 40 mg via ORAL
  Filled 2021-07-29 (×14): qty 1

## 2021-07-29 MED ORDER — CHLORHEXIDINE GLUCONATE CLOTH 2 % EX PADS
6.0000 | MEDICATED_PAD | Freq: Every day | CUTANEOUS | Status: DC
  Administered 2021-07-29 – 2021-08-12 (×15): 6 via TOPICAL

## 2021-07-29 MED ORDER — ACETAMINOPHEN 650 MG RE SUPP: 650.0000 mg | Freq: Four times a day (QID) | RECTAL | Status: DC | PRN

## 2021-07-29 MED ORDER — RIVAROXABAN 20 MG PO TABS
20.0000 mg | ORAL_TABLET | Freq: Every day | ORAL | Status: DC
  Administered 2021-07-29: 20 mg via ORAL
  Filled 2021-07-29: qty 1

## 2021-07-29 MED ORDER — FLUTICASONE PROPIONATE 50 MCG/ACT NA SUSP
2.0000 | Freq: Every day | NASAL | Status: DC
  Administered 2021-07-29 – 2021-08-12 (×14): 2 via NASAL
  Filled 2021-07-29: qty 16

## 2021-07-29 MED ORDER — ADULT MULTIVITAMIN W/MINERALS CH
1.0000 | ORAL_TABLET | Freq: Every day | ORAL | Status: DC
  Administered 2021-07-29 – 2021-08-12 (×14): 1 via ORAL
  Filled 2021-07-29 (×15): qty 1

## 2021-07-29 MED ORDER — ORAL CARE MOUTH RINSE
15.0000 mL | Freq: Two times a day (BID) | OROMUCOSAL | Status: DC
  Administered 2021-07-29 – 2021-08-12 (×19): 15 mL via OROMUCOSAL

## 2021-07-29 MED ORDER — METOPROLOL SUCCINATE ER 25 MG PO TB24
25.0000 mg | ORAL_TABLET | Freq: Every day | ORAL | Status: DC
  Administered 2021-07-29: 25 mg via ORAL
  Filled 2021-07-29: qty 1

## 2021-07-29 MED ORDER — PROMETHAZINE HCL 25 MG PO TABS
25.0000 mg | ORAL_TABLET | Freq: Four times a day (QID) | ORAL | Status: DC | PRN
  Administered 2021-07-29: 25 mg via ORAL
  Filled 2021-07-29: qty 1

## 2021-07-29 MED ORDER — TAMSULOSIN HCL 0.4 MG PO CAPS
0.4000 mg | ORAL_CAPSULE | Freq: Every day | ORAL | Status: DC
  Administered 2021-07-29 – 2021-08-11 (×14): 0.4 mg via ORAL
  Filled 2021-07-29 (×14): qty 1

## 2021-07-29 MED ORDER — ACETAMINOPHEN 500 MG PO TABS
1000.0000 mg | ORAL_TABLET | Freq: Three times a day (TID) | ORAL | Status: DC | PRN
  Filled 2021-07-29: qty 2

## 2021-07-29 MED ORDER — PROCHLORPERAZINE EDISYLATE 10 MG/2ML IJ SOLN
5.0000 mg | Freq: Once | INTRAMUSCULAR | Status: AC
  Administered 2021-07-29: 5 mg via INTRAVENOUS
  Filled 2021-07-29: qty 2

## 2021-07-29 MED ORDER — GUAIFENESIN ER 600 MG PO TB12
600.0000 mg | ORAL_TABLET | Freq: Two times a day (BID) | ORAL | Status: DC
  Administered 2021-07-29 (×2): 600 mg via ORAL
  Filled 2021-07-29 (×2): qty 1

## 2021-07-29 MED ORDER — TRAZODONE HCL 50 MG PO TABS
25.0000 mg | ORAL_TABLET | Freq: Every evening | ORAL | Status: DC | PRN
  Administered 2021-07-31 – 2021-08-05 (×4): 25 mg via ORAL
  Filled 2021-07-29 (×4): qty 1

## 2021-07-29 MED ORDER — CARBIDOPA-LEVODOPA 25-100 MG PO TABS
1.0000 | ORAL_TABLET | ORAL | Status: DC
  Administered 2021-07-29 – 2021-08-12 (×41): 1 via ORAL
  Filled 2021-07-29 (×41): qty 1

## 2021-07-29 NOTE — Progress Notes (Addendum)
Pharmacy Antibiotic Note  Corey [name] is a 75 y.o. male admitted on 07/28/2021 with cellulitis.  Pharmacy has been consulted for vancomycin dosing.  S/p  5 day course of levaquin (10/15- 10/19) for PNA and has new LLE non purulent cellulitis.  10/25 CXR: unchanged LLL opacity  Plan: Vancomycin 2000 mg IV x 1 followed by Vancomycin 1 gm IV q12 (used SCr 0.8, VD 0.5) for est AUC 480.8 Rocephin 2 gm IV q24 per MD to cover PNA & cellulitis F/u renal function, WBC, temp Vancomycin levels as needed  Height: 6' 1.5" (186.7 cm) Weight: 102.1 kg (225 lb 1.4 oz) IBW/kg (Calculated) : 81.05  Temp (24hrs), Avg:99 F (37.2 C), Min:98.2 F (36.8 C), Max:99.7 F (37.6 C)  Recent Labs  Lab 07/28/21 1102 07/28/21 1109 07/28/21 1144 07/28/21 1403 07/29/21 0648  WBC 6.9  --   --   --  7.0  CREATININE  --  0.71  --   --  0.73  LATICACIDVEN  --   --  0.7 0.5  --     Estimated Creatinine Clearance: 102.6 mL/min (by C-G formula based on SCr of 0.73 mg/dL).    Allergies  Allergen Reactions   Amoxicillin Swelling    Tolerates Rocephin   Fosinopril Other (See Comments)   Hydromorphone Nausea Only, Other (See Comments) and Nausea And Vomiting   Ampicillin Rash   Monopril [Fosinopril Sodium] Other (See Comments)    Muscle aches & pain   Other Other (See Comments)   Rosuvastatin Other (See Comments)    Joint pain   Antimicrobials this admission:  10/25 cefazolin x 1  10/25 cefepime x 1  10/25 vanc x 1, resumed 10/26>> 10/26 CTX>> PTA: 10/15 LVQ>> 10/19 Dose adjustments this admission:   Microbiology results:  10/25 BCx 2 ngtd  Thank you for allowing pharmacy to be a part of this patient's care.  Eudelia Bunch, Pharm.D 07/29/2021 8:56 AM

## 2021-07-29 NOTE — Telephone Encounter (Signed)
Per wife-pt admitted w/cellulitis  and ? lung infection.   She wants oncology to weigh in on situation.   Can the outpt CT for 10/28 be done in hospital? Possible D/C  tomorrow.

## 2021-07-29 NOTE — ED Notes (Signed)
[facility] called for Transport at this Time.

## 2021-07-29 NOTE — H&P (Signed)
History and Physical    ELRIDGE STEMM JOA:[id number] DOB: 1945/12/18 DOA: 07/28/2021  PCP: [name], MD  Patient coming from: home  Chief Complaint: fatigue, weakness  HPI: Corey [name] is a 75 y.o. male with medical history significant of NSCLC on chemo, OSA, atrial fib, CAD. Presenting with LLE swelling and generalized weakness. He reports that over the last week he has noticed erythema and swelling starting in his left ankle and progressing upwards. He thought at first it was just dry, irritated skin. So he tried some lotion, but it didn't help. He hasn't had any drainage in the area. He hasn't noticed fever. He's noticed that he's been progressively weaker during this time. He notes that he was recently on lvq for a PNA. His regimen stopped the day before his erythema started. In addition, his cough and shortness of breath from his PNA returned. He became concerned when his symptoms didn't improve yesterday. So, he came to the ED for help.   ED Course: CXR showed an unchanged LLL opacity. No fevers recored. WBC normal. Noted to have LLLE cellulitis. Margins marked. He was started on vanc, cefepime. [facility] was called for admission.   Review of Systems:  Denies CP, palpitations, lightheadedness, dizziness, N/V/D, fever. Reports shortness of breath, increased cough. Review of systems is otherwise negative for all not mentioned in HPI.   PMHx Past Medical History:  Diagnosis Date   Anemia    Anxiety    Arthritis    "back, right knee, hands, ankles, neck" (05/31/2016)   Carotid artery disease ([place]) 08/30/2016   Carotid US 11/19: R 1-39, L 40-59 // Carotid US 08/2019: R 1-39; L 40-59 // Carotid US 11/21: R 1-39; L 40-59>> repeat 1 year    Chronic lower back pain    Coronary atherosclerosis of native coronary artery    a. BMS to [facility] 2004 and 2007, otherwise mild nonobstructive disease. EF normal.   Diverticulitis    Dyslipidemia    Dyspnea    Essential hypertension, benign    Family  history of breast cancer    Family history of lung cancer    Family history of prostate cancer    GERD (gastroesophageal reflux disease)    Lumbar radiculopathy, chronic 02/04/2015   Right L5   Obesity    OSA on CPAP    uses cpap   Paroxysmal atrial fibrillation ([place])    a. Discovered after stroke.   Pneumonia 01/1996   PONV (postoperative nausea and vomiting)    Prostate CA ([place])    Recurrent upper respiratory infection (URI)    Stroke ([place]) 07/2012   no deficits   Visit for monitoring Tikosyn therapy 09/18/2019    PSHx Past Surgical History:  Procedure Laterality Date   ADENOIDECTOMY     ANTERIOR CERVICAL DECOMP/DISCECTOMY FUSION  07/2001; 10/2002   "C5-6; C6-7; redo"   BACK SURGERY     CARPAL TUNNEL RELEASE Left 10/2015   CHEST TUBE INSERTION Left 03/17/2021   Procedure: INSERTION PLEURAL DRAINAGE CATHETER;  Surgeon: Garner Nash, DO;  Location: [facility];  Service: Pulmonary;  Laterality: Left;  Indwelling Tunneled Pleural catheter (PLEUREX)    COLONOSCOPY W/ POLYPECTOMY  02/2014   CORONARY ANGIOPLASTY WITH STENT PLACEMENT  05/2003; 12/2005   "mid RCA; mid RCA"   FINE NEEDLE ASPIRATION  02/26/2021   Procedure: FINE NEEDLE ASPIRATION (FNA) LINEAR;  Surgeon: Candee Furbish, MD;  Location[facility] ENDOSCOPY;  Service: Pulmonary;;   HAND SURGERY  02/2019   LEFT  HAND   implantable loop recorder placement  02/27/2020   [employer] Reveal [place] model [id number]  implantable loop recorder implanted by Dr Rayann Heman for afib management and evaluation of presyncope   IR IMAGING GUIDED PORT INSERTION  04/16/2021   JOINT REPLACEMENT     KNEE ARTHROSCOPY Left 10/2005   KNEE ARTHROSCOPY W/ PARTIAL MEDIAL MENISCECTOMY Left 09/2005   LUMBAR LAMINECTOMY/DECOMPRESSION MICRODISCECTOMY  03/2005   "L4-5"   POSTERIOR LUMBAR FUSION  10/2003   L5-S1; "plates, screws"   SHOULDER ARTHROSCOPY Right 08/2011   Debridement of labrum, arthroscopic distal clavicle excision   SHOULDER OPEN ROTATOR CUFF  REPAIR Left 07/2014   TEE WITHOUT CARDIOVERSION  07/07/2012   Procedure: TRANSESOPHAGEAL ECHOCARDIOGRAM (TEE);  Surgeon: Fay Records, MD;  Location: [facility] ENDOSCOPY;  Service: Cardiovascular;  Laterality: N/A;   THORACENTESIS N/A 02/25/2021   Procedure: Mathews Robinsons;  Surgeon: Juanito Doom, MD;  Location: [facility];  Service: Cardiopulmonary;  Laterality: N/A;   TONSILLECTOMY AND ADENOIDECTOMY  ~ 1956   TOTAL KNEE ARTHROPLASTY Left 10/2006   TRIGGER FINGER RELEASE Left 10/2015   VIDEO BRONCHOSCOPY WITH ENDOBRONCHIAL ULTRASOUND Left 02/26/2021   Procedure: VIDEO BRONCHOSCOPY WITH ENDOBRONCHIAL ULTRASOUND;  Surgeon: Candee Furbish, MD;  Location: [facility] ENDOSCOPY;  Service: Pulmonary;  Laterality: Left;  cryoprobe too thanks!    SocHx  reports that he quit smoking about 18 years ago. His smoking use included cigarettes. He has a 34.00 pack-year smoking history. He has never been exposed to tobacco smoke. He has never used smokeless tobacco. He reports that he does not drink alcohol and does not use drugs.  Allergies  Allergen Reactions   Amoxicillin Swelling    Tolerates Rocephin   Fosinopril Other (See Comments)   Hydromorphone Nausea Only, Other (See Comments) and Nausea And Vomiting   Ampicillin Rash   Monopril [Fosinopril Sodium] Other (See Comments)    Muscle aches & pain   Other Other (See Comments)   Rosuvastatin Other (See Comments)    Joint pain    FamHx Family History  Problem Relation Age of Onset   Hypertension Mother    Heart disease Father    Heart attack Father    Prostate cancer Brother 9   Parkinson's disease Brother    Lung cancer Paternal Aunt        hx of smoking   Breast cancer Paternal Grandmother        dx 58s, bilateral mastectomies   Heart attack Paternal Grandfather    Blindness Son    Colon cancer Neg Hx    Pancreatic cancer Neg Hx     Prior to Admission medications   Medication Sig Start Date End Date Taking? Authorizing Provider   acetaminophen (TYLENOL 8 HOUR ARTHRITIS PAIN) 650 MG CR tablet Take 1,300 mg by mouth every 8 (eight) hours as needed for pain (pain).   Yes [provider]  albuterol (VENTOLIN HFA) 108 (90 Base) MCG/ACT inhaler Inhale 2 puffs into the lungs every 4 (four) hours as needed for wheezing or shortness of breath. Inhale 2 puffs into the lungs every 4 hours as needed 04/04/21  Yes Rai, Ripudeep K, MD  ALPRAZolam (XANAX) 0.5 MG tablet Take 1 tablet (0.5 mg total) by mouth at bedtime as needed for anxiety. 05/20/21  Yes Pahwani, Einar Grad, MD  atorvastatin (LIPITOR) 40 MG tablet Take 1 tablet (40 mg total) by mouth daily. 01/21/21  Yes Nahser, Wonda Cheng, MD  benzonatate (TESSALON) 200 MG capsule TAKE 1 CAPSULE (200 MG TOTAL)  BY MOUTH 3 (THREE) TIMES DAILY AS NEEDED FOR COUGH. 07/10/21  Yes Heilingoetter, Cassandra L, PA-C  carbidopa-levodopa (SINEMET IR) 25-100 MG tablet Take 1 tablet by mouth 3 (three) times daily. Patient taking differently: Take 1 tablet by mouth 3 (three) times daily. 0800, 1200, 1600 07/09/21  Yes Tat, Eustace Quail, DO  Carbinoxamine Maleate 6 MG TABS Take 1 tablet by mouth 2 (two) times daily as needed (drainage). 03/24/21  Yes Garnet Sierras, DO  chlorpheniramine-HYDROcodone (TUSSIONEX) 10-8 MG/5ML SUER Take 5 mLs by mouth every 12 (twelve) hours as needed for cough. 07/03/21  Yes Martyn Ehrich, NP  dofetilide (TIKOSYN) 500 MCG capsule Take 1 capsule (500 mcg total) by mouth 2 (two) times daily. Please keep upcoming appointment in February for future refills. Thank you 01/21/21  Yes Nahser, Wonda Cheng, MD  fenofibrate 160 MG tablet TAKE 1 TABLET DAILY. PLEASE KEEP UPCOMING APPT IN MARCH WITH DR. Acie Fredrickson BEFORE ANYMORE REFILLS. Patient taking differently: Take 160 mg by mouth daily. 01/21/21  Yes Nahser, Wonda Cheng, MD  fluticasone (FLONASE) 50 MCG/ACT nasal spray Place 2 sprays into both nostrils daily. 09/18/20  Yes Garnet Sierras, DO  folic acid (FOLVITE) 1 MG tablet TAKE 1 TABLET BY MOUTH EVERY  DAY Patient taking differently: Take 1 mg by mouth daily. 06/14/21  Yes Heilingoetter, Cassandra L, PA-C  ipratropium (ATROVENT) 0.03 % nasal spray Place 1-2 sprays into both nostrils every 12 (twelve) hours. For drainage 03/24/21  Yes Garnet Sierras, DO  lidocaine-prilocaine (EMLA) cream Apply 1 application topically as needed. Patient taking differently: Apply 1 application topically as needed (treatment). 04/07/21  Yes Heilingoetter, Cassandra L, PA-C  magnesium oxide (MAG-OX) 400 (240 Mg) MG tablet TAKE 1 TABLET BY MOUTH EVERY DAY Patient taking differently: Take 400 mg by mouth daily. 06/03/21  Yes Fenton, Clint R, PA  metoprolol succinate (TOPROL-XL) 25 MG 24 hr tablet TAKE 1 TABLET BY MOUTH EVERY DAY Patient taking differently: Take 25 mg by mouth daily. 07/13/21  Yes AllredJeneen Rinks, MD  Multiple Vitamins-Minerals (MULTIVITAMINS THER. W/MINERALS) TABS Take 1 tablet by mouth daily.   Yes [provider]  omeprazole (PRILOSEC) 20 MG capsule Take 1 capsule (20 mg total) by mouth daily. 03/24/21  Yes Garnet Sierras, DO  ondansetron (ZOFRAN) 8 MG tablet Take 1 tablet (8 mg total) by mouth every 8 (eight) hours as needed for nausea or vomiting. Starting 3 days after chemotherapy 05/11/21  Yes Curt Bears, MD  potassium chloride (KLOR-CON) 10 MEQ tablet TAKE 1 TABLET BY MOUTH EVERY DAY Patient taking differently: Take 10 mEq by mouth daily. 07/16/21  Yes Nahser, Wonda Cheng, MD  Saccharomyces boulardii (PROBIOTIC) 250 MG CAPS Take 250 mg by mouth daily.   Yes [provider]  tamsulosin (FLOMAX) 0.4 MG CAPS capsule Take 0.4 mg by mouth daily after supper.   Yes [provider]  Tiotropium Bromide-Olodaterol (STIOLTO RESPIMAT) 2.5-2.5 MCG/ACT AERS Inhale 2 puffs into the lungs daily. 04/04/21  Yes Rai, Ripudeep K, MD  topiramate (TOPAMAX) 50 MG tablet Take 2 tablets (100 mg total) by mouth 2 (two) times daily. 04/13/21  Yes Frann Rider, NP  traZODone (DESYREL) 50 MG tablet Take 0.5  tablets (25 mg total) by mouth at bedtime as needed for sleep. 09/22/20  Yes McCue, Janett Billow, NP  Vitamin D, Ergocalciferol, (DRISDOL) 1.25 MG (50000 UNIT) CAPS capsule TAKE 1 CAPSULE BY MOUTH ONE TIME PER WEEK Patient taking differently: Take 50,000 Units by mouth every 7 (seven) days. Monday 03/19/21  Yes Kale, Cloria [place], MD  XARELTO 20 MG TABS tablet TAKE 1 TABLET BY MOUTH EVERY DAY Patient taking differently: 20 mg daily with supper. 10/06/20  Yes Nahser, Wonda Cheng, MD  diclofenac Sodium (VOLTAREN) 1 % GEL Apply 4 g topically 4 (four) times daily. Patient not taking: No sig reported 02/27/21   Cristal Ford, DO  loperamide (IMODIUM) 2 MG capsule Take 2 mg by mouth daily as needed for diarrhea or loose stools.    [provider]  nitroGLYCERIN (NITROSTAT) 0.4 MG SL tablet Place 1 tablet (0.4 mg total) under the tongue every 5 (five) minutes as needed for chest pain. 01/21/21   Nahser, Wonda Cheng, MD  promethazine (PHENERGAN) 25 MG tablet Take 1 tablet (25 mg total) by mouth every 6 (six) hours as needed for nausea or vomiting. 03/25/21   Curt Bears, MD  solifenacin (VESICARE) 5 MG tablet Take 5 mg by mouth at bedtime.    [provider]  traMADol (ULTRAM) 50 MG tablet Take 1 tablet (50 mg total) by mouth every 6 (six) hours as needed for moderate pain. 05/20/21   Darliss Cheney, MD    Physical Exam: Vitals:   07/29/21 0430 07/29/21 0510 07/29/21 0513 07/29/21 0649  BP: 105/84  (!) 144/84   Pulse: 100  (!) 112 (!) 106  Resp: 19  20   Temp:   99.7 F (37.6 C)   TempSrc:   Oral   SpO2: 94%  94%   Weight:  102.1 kg    Height:  6' 1.5" (1.867 m)      General: 75 y.o. male resting in bed in NAD Eyes: PERRL, normal sclera ENMT: Nares patent w/o discharge, orophaynx clear, dentition normal, ears w/o discharge/lesions/ulcers Neck: Supple, trachea midline Cardiovascular: RRR, +S1, S2, no m/g/r, equal pulses throughout; right chest port noted Respiratory: CTABL, no w/r/r,  normal WOB GI: BS+, NDNT, no masses noted, no organomegaly noted MSK: No c/c; LLE cellulitis from ankle to mid shin, no purulence Skin: No rashes, bruises, ulcerations noted Neuro: A&O x 3, no focal deficits Psyc: Appropriate interaction and affect, calm/cooperative  Labs on Admission: I have personally reviewed following labs and imaging studies  CBC: Recent Labs  Lab 07/22/21 0813 07/28/21 1102  WBC 1.4* 6.9  NEUTROABS 0.8*  --   HGB 8.6* 9.0*  HCT 25.1* 27.4*  MCV 90.6 91.6  PLT 117* 59*   Basic Metabolic Panel: Recent Labs  Lab 07/22/21 0813 07/28/21 1109  NA 131* 126*  K 3.9 3.7  CL 101 96*  CO2 19* 21*  GLUCOSE 112* 110*  BUN 16 16  CREATININE 0.79 0.71  CALCIUM 9.3 9.0   GFR: Estimated Creatinine Clearance: 102.6 mL/min (by C-G formula based on SCr of 0.71 mg/dL). Liver Function Tests: Recent Labs  Lab 07/22/21 0813 07/28/21 1109  AST 28 27  ALT 15 16  ALKPHOS 54 56  BILITOT 0.8 0.8  PROT 6.9 6.8  ALBUMIN 2.2* 3.0*   No results for input(s): LIPASE, AMYLASE in the last 168 hours. No results for input(s): AMMONIA in the last 168 hours. Coagulation Profile: Recent Labs  Lab 07/28/21 1109  INR 1.6*   Cardiac Enzymes: No results for input(s): CKTOTAL, CKMB, CKMBINDEX, TROPONINI in the last 168 hours. BNP (last 3 results) No results for input(s): PROBNP in the last 8760 hours. HbA1C: No results for input(s): HGBA1C in the last 72 hours. CBG: No results for input(s): GLUCAP in the last 168 hours. Lipid Profile: No results for input(s):  CHOL, HDL, LDLCALC, TRIG, CHOLHDL, LDLDIRECT in the last 72 hours. Thyroid Function Tests: No results for input(s): TSH, T4TOTAL, FREET4, T3FREE, THYROIDAB in the last 72 hours. Anemia Panel: No results for input(s): VITAMINB12, FOLATE, FERRITIN, TIBC, IRON, RETICCTPCT in the last 72 hours. Urine analysis:    Component Value Date/Time   COLORURINE YELLOW 07/28/2021 [place] 07/28/2021 1240    LABSPEC 1.009 07/28/2021 1240   PHURINE 6.0 07/28/2021 [place] 07/28/2021 [place] 07/28/2021 [place] 07/28/2021 [place] 07/28/2021 [place] 07/28/2021 1240   NITRITE NEGATIVE 07/28/2021 [place] 07/28/2021 1240    Radiological Exams on Admission: DG Chest Port 1 View  Result Date: 07/28/2021 CLINICAL DATA:  Shortness of breath and cough EXAM: PORTABLE CHEST 1 VIEW COMPARISON:  Chest x-ray dated July 18, 2021 FINDINGS: Cardiac and mediastinal contours are within normal limits. Unchanged position of right chest wall PICC. Unchanged left lung volume loss with mid and lower lung opacities. No evidence of large pleural region or pneumothorax. No new parenchymal process. IMPRESSION: No new parenchymal process. Unchanged left lower lobe opacity. Electronically Signed   By: Yetta Glassman M.D.   On: 07/28/2021 12:05    EKG: Independently reviewed. Sinus, no st elevations  Assessment/Plan LLE cellulitis     - placed in obs, tele     - start vanc, rocephin     - margins marked     - follow bld cx  Hypokalemia Hypomagnesemia Hyponatremia     - replace K+/Mg2+; follow AM labs     - Na+ is 129; fluids, follow  Stage 4 NSCLC     - on palliative chemo w/ Dr [name]     - continue outpt follow up     - continue home inhalers  Paroxysmal A fib CAD HTN HLD     - continue metoprolol, tikosyn     - continue lipitor, fenofibrate  Parkinson's     - continue home regimen  Prolonged QT     - replace K+/Mg2+; follow  OSA     - CPAP qHS  Hx of CVA     - continue home regimen  Generalized weakness     - multifactorial; but most immediate correctable condition is cleaning up his infection     - let's do that and reassess  DVT prophylaxis: xarelto Code Status: DNR, confirmed with patient  Family Communication: None at bedside  Consults called: None   Status is:  Observation  The patient remains OBS appropriate and will d/c before 2 midnights.  Time spent coordinating admission: 45 minutes  [facility]  If 7PM-7AM, please contact night-coverage [email or web address]  07/29/2021, 7:22 AM

## 2021-07-29 NOTE — Plan of Care (Signed)

## 2021-07-29 NOTE — TOC Initial Note (Signed)
Transition of Care [facility]) - Initial/Assessment Note    Patient Details  Name: Corey [name] MRN: [id number] Date of Birth: 11-27-1945  Transition of Care [facility]) CM/SW Contact:    Leeroy Cha, RN Phone Number: 07/29/2021, 9:15 AM  Clinical Narrative:                  75 y.o. male with medical history significant of NSCLC on chemo, OSA, atrial fib, CAD. Presenting with LLE swelling and generalized weakness. He reports that over the last week he has noticed erythema and swelling starting in his left ankle and progressing upwards. He thought at first it was just dry, irritated skin. So he tried some lotion, but it didn't help. He hasn't had any drainage in the area. He hasn't noticed fever. He's noticed that he's been progressively weaker during this time. He notes that he was recently on lvq for a PNA. His regimen stopped the day before his erythema started. In addition, his cough and shortness of breath from his PNA returned. He became concerned when his symptoms didn't improve yesterday. So, he came to the ED for help.    ED Course: CXR showed an unchanged LLL opacity. No fevers recored. WBC normal. Noted to have LLLE cellulitis. Margins marked. He was started on vanc, cefepime TOC PLAN OF CARE: To return home with self care.  Following for progression. Expected Discharge Plan: Home/Self Care Barriers to Discharge: Continued Medical Work up   Patient Goals and CMS Choice Patient states their goals for this hospitalization and ongoing recovery are:: to go home CMS Medicare.gov Compare Post Acute Care list provided to:: Patient    Expected Discharge Plan and Services Expected Discharge Plan: Home/Self Care   Discharge Planning Services: CM Consult   Living arrangements for the past 2 months: Single Family Home                                      Prior Living Arrangements/Services Living arrangements for the past 2 months: Single Family Home Lives with:: Spouse   Do  you feel safe going back to the place where you live?: Yes            Criminal Activity/Legal Involvement Pertinent to Current Situation/Hospitalization: No - Comment as needed  Activities of Daily Living Home Assistive Devices/Equipment: Eyeglasses, CPAP, Walker (specify type), Other (Comment) (four wheel walker, rollator, transfer chair) ADL Screening (condition at time of admission) Patient's cognitive ability adequate to safely complete daily activities?: Yes Is the patient deaf or have difficulty hearing?: No Does the patient have difficulty seeing, even when wearing glasses/contacts?: No Does the patient have difficulty concentrating, remembering, or making decisions?: No Patient able to express need for assistance with ADLs?: Yes Does the patient have difficulty dressing or bathing?: Yes Independently performs ADLs?: No Communication: Independent Dressing (OT): Needs assistance (past 5-6 days) Is this a change from baseline?: Change from baseline, expected to last >3 days Grooming: Needs assistance (past 5-6 days) Is this a change from baseline?: Change from baseline, expected to last >3 days Feeding: Independent Bathing: Needs assistance (past 5-6 days) Is this a change from baseline?: Change from baseline, expected to last >3 days Toileting: Needs assistance (past 5-6 days) Is this a change from baseline?: Change from baseline, expected to last >3days In/Out Bed: Needs assistance (past 5-6 days) Is this a change from baseline?: Change from baseline, expected to last >  3 days Walks in Home: Needs assistance (past 5-6 days) Is this a change from baseline?: Change from baseline, expected to last >3 days Does the patient have difficulty walking or climbing stairs?: Yes Weakness of Legs: Both (last 3 months) Weakness of Arms/Hands: Both (since onset of cancer)  Permission Sought/Granted                  Emotional Assessment Appearance:: Appears stated age      Orientation: : Oriented to Self, Oriented to Place, Oriented to  Time, Oriented to Situation Alcohol / Substance Use: Not Applicable    Admission diagnosis:  Shortness of breath [R06.02] Malaise [R53.81] Cellulitis of left lower extremity [E75.170] Patient Active Problem List   Diagnosis Date Noted   Malaise 07/28/2021   Symptomatic anemia 05/14/2021   Port-A-Cath in place 04/27/2021   Genetic testing 01/74/9449   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   COPD with acute exacerbation ([place]) 04/02/2021   Cough with hemoptysis 04/01/2021   CAP (community acquired pneumonia) 04/01/2021   Family history of prostate cancer 03/20/2021   Family history of breast cancer 03/20/2021   Family history of lung cancer 03/20/2021   Goals of care, counseling/discussion 03/18/2021   Encounter for antineoplastic chemotherapy 03/18/2021   Encounter for antineoplastic immunotherapy 03/18/2021   Primary cancer of left lower lobe of lung ([place]) 03/07/2021   Non-small cell carcinoma of left lung, stage 4 ([place]) 03/05/2021   Malnutrition of moderate degree 02/25/2021   Pleural effusion on left 02/24/2021   Hilar mass    Reactive airway disease 11/20/2020   Allergic rhinitis with non-allergic component 09/18/2020   Heartburn 09/18/2020   Coughing 09/18/2020   Malignant neoplasm of prostate ([place]) 03/04/2020   Pre-syncope 02/27/2020   Cerebral embolism with transient ischemic attack (TIA) 02/11/2020   History of stroke 67/59/1638   Embolic stroke involving cerebral artery ([place]) 02/11/2020   Secondary hypercoagulable state ([place]) 09/10/2019   Essential tremor 06/25/2019   Chest pain 05/27/2017   Demand ischemia ([place]) 05/27/2017   Morbid obesity ([name]) 05/27/2017   Carotid artery disease ([place]) 08/30/2016   Essential hypertension 08/30/2016   Diverticulitis of intestine without perforation or abscess without bleeding    Hyponatremia 06/04/2016   Diverticulitis 05/31/2016   Iron deficiency  01/05/2016   Lumbar radiculopathy, chronic 02/04/2015   SVT (supraventricular tachycardia) ([place]) 12/03/2014   PAC (premature atrial contraction) 04/05/2014   OSA on CPAP 03/27/2014   AKI (acute kidney injury) ([place]) 12/05/2013   Nausea 12/05/2013   Sepsis ([place]) 12/05/2013   Abdominal pain 12/05/2013   AF (paroxysmal atrial fibrillation) ([place]) 01/05/2013   CVA (cerebral infarction) 07/06/2012   Coronary artery disease    Benign essential HTN    Dyslipidemia    GERD (gastroesophageal reflux disease)    PCP:  [name], MD Pharmacy:   [facility] - [place], [place] - [address] [place] [address] Phone: [phone] Fax: [phone]  [id number] [place]Phone: [phone]: [place] [address] [place] [place]: [phone]: [phone] [facility] [place]- [address]: [phone]: [phone] [facility] [address] [place] [address]Phone: [phone]  Fax: [phone]     Social Determinants of Health (SDOH) Interventions    Readmission Risk Interventions No flowsheet data found.

## 2021-07-29 NOTE — Progress Notes (Signed)
Patient has arrived on the unit. Admissions notified.

## 2021-07-30 ENCOUNTER — Inpatient Hospital Stay (HOSPITAL_COMMUNITY): Payer: Medicare PPO

## 2021-07-30 DIAGNOSIS — J189 Pneumonia, unspecified organism: Secondary | ICD-10-CM

## 2021-07-30 DIAGNOSIS — R112 Nausea with vomiting, unspecified: Secondary | ICD-10-CM

## 2021-07-30 DIAGNOSIS — L03116 Cellulitis of left lower limb: Secondary | ICD-10-CM | POA: Diagnosis not present

## 2021-07-30 DIAGNOSIS — E871 Hypo-osmolality and hyponatremia: Secondary | ICD-10-CM

## 2021-07-30 DIAGNOSIS — C3432 Malignant neoplasm of lower lobe, left bronchus or lung: Secondary | ICD-10-CM

## 2021-07-30 DIAGNOSIS — E876 Hypokalemia: Secondary | ICD-10-CM | POA: Diagnosis present

## 2021-07-30 DIAGNOSIS — E785 Hyperlipidemia, unspecified: Secondary | ICD-10-CM

## 2021-07-30 DIAGNOSIS — I480 Paroxysmal atrial fibrillation: Secondary | ICD-10-CM

## 2021-07-30 DIAGNOSIS — G4733 Obstructive sleep apnea (adult) (pediatric): Secondary | ICD-10-CM

## 2021-07-30 DIAGNOSIS — I10 Essential (primary) hypertension: Secondary | ICD-10-CM

## 2021-07-30 DIAGNOSIS — Z9989 Dependence on other enabling machines and devices: Secondary | ICD-10-CM

## 2021-07-30 DIAGNOSIS — Z8673 Personal history of transient ischemic attack (TIA), and cerebral infarction without residual deficits: Secondary | ICD-10-CM

## 2021-07-30 LAB — CBC
HCT: 26.2 % — ABNORMAL LOW (ref 39.0–52.0)
Hemoglobin: 8.6 g/dL — ABNORMAL LOW (ref 13.0–17.0)
MCH: 31.2 pg (ref 26.0–34.0)
MCHC: 32.8 g/dL (ref 30.0–36.0)
MCV: 94.9 fL (ref 80.0–100.0)
Platelets: 115 10*3/uL — ABNORMAL LOW (ref 150–400)
RBC: 2.76 MIL/uL — ABNORMAL LOW (ref 4.22–5.81)
RDW: 18.8 % — ABNORMAL HIGH (ref 11.5–15.5)
WBC: 8.9 10*3/uL (ref 4.0–10.5)
nRBC: 0 % (ref 0.0–0.2)

## 2021-07-30 LAB — COMPREHENSIVE METABOLIC PANEL
ALT: 14 U/L (ref 0–44)
AST: 38 U/L (ref 15–41)
Albumin: 2.3 g/dL — ABNORMAL LOW (ref 3.5–5.0)
Alkaline Phosphatase: 54 U/L (ref 38–126)
Anion gap: 9 (ref 5–15)
BUN: 14 mg/dL (ref 8–23)
CO2: 24 mmol/L (ref 22–32)
Calcium: 8.2 mg/dL — ABNORMAL LOW (ref 8.9–10.3)
Chloride: 101 mmol/L (ref 98–111)
Creatinine, Ser: 0.66 mg/dL (ref 0.61–1.24)
GFR, Estimated: >60 mL/min (ref >60)
Glucose, Bld: 123 mg/dL — ABNORMAL HIGH (ref 70–99)
Potassium: 3.5 mmol/L (ref 3.5–5.1)
Sodium: 134 mmol/L — ABNORMAL LOW (ref 135–145)
Total Bilirubin: 0.8 mg/dL (ref 0.3–1.2)
Total Protein: 6.7 g/dL (ref 6.5–8.1)

## 2021-07-30 LAB — EXPECTORATED SPUTUM ASSESSMENT W GRAM STAIN, RFLX TO RESP C

## 2021-07-30 LAB — STREP PNEUMONIAE URINARY ANTIGEN: Strep Pneumo Urinary Antigen: NEGATIVE

## 2021-07-30 LAB — MAGNESIUM: Magnesium: 2 mg/dL (ref 1.7–2.4)

## 2021-07-30 MED ORDER — POTASSIUM CHLORIDE IN NACL 40-0.9 MEQ/L-% IV SOLN
INTRAVENOUS | Status: DC
  Filled 2021-07-30 (×6): qty 1000

## 2021-07-30 MED ORDER — SODIUM CHLORIDE 0.9% FLUSH: 10.0000 mL | INTRAVENOUS | Status: DC | PRN

## 2021-07-30 MED ORDER — FOLIC ACID 5 MG/ML IJ SOLN
1.0000 mg | Freq: Every day | INTRAMUSCULAR | Status: DC
  Administered 2021-07-30: 1 mg via INTRAVENOUS
  Filled 2021-07-30 (×2): qty 0.2

## 2021-07-30 MED ORDER — ENOXAPARIN SODIUM 100 MG/ML IJ SOSY
100.0000 mg | PREFILLED_SYRINGE | Freq: Two times a day (BID) | INTRAMUSCULAR | Status: DC
  Administered 2021-07-30 – 2021-07-31 (×3): 100 mg via SUBCUTANEOUS
  Filled 2021-07-30 (×3): qty 1

## 2021-07-30 MED ORDER — SODIUM CHLORIDE 0.9 % IV SOLN: 1.0000 mg | Freq: Every day | INTRAVENOUS | Status: DC

## 2021-07-30 MED ORDER — SODIUM CHLORIDE 0.9 % IV SOLN: INTRAVENOUS | Status: DC

## 2021-07-30 MED ORDER — PANTOPRAZOLE SODIUM 40 MG IV SOLR
40.0000 mg | INTRAVENOUS | Status: DC
  Administered 2021-07-30: 40 mg via INTRAVENOUS
  Filled 2021-07-30: qty 40

## 2021-07-30 MED ORDER — SODIUM CHLORIDE 0.9 % IV SOLN
500.0000 mg | INTRAVENOUS | Status: DC
  Administered 2021-07-30 – 2021-07-31 (×2): 500 mg via INTRAVENOUS
  Filled 2021-07-30 (×2): qty 500

## 2021-07-30 MED ORDER — GUAIFENESIN ER 600 MG PO TB12
1200.0000 mg | ORAL_TABLET | Freq: Two times a day (BID) | ORAL | Status: DC
  Administered 2021-07-30 – 2021-08-12 (×26): 1200 mg via ORAL
  Filled 2021-07-30 (×27): qty 2

## 2021-07-30 MED ORDER — POTASSIUM CHLORIDE 10 MEQ/100ML IV SOLN
10.0000 meq | INTRAVENOUS | Status: AC
  Administered 2021-07-30 (×4): 10 meq via INTRAVENOUS
  Filled 2021-07-30 (×4): qty 100

## 2021-07-30 MED ORDER — PROCHLORPERAZINE EDISYLATE 10 MG/2ML IJ SOLN
10.0000 mg | Freq: Four times a day (QID) | INTRAMUSCULAR | Status: DC | PRN
  Administered 2021-07-30: 10 mg via INTRAVENOUS
  Filled 2021-07-30: qty 2

## 2021-07-30 MED ORDER — METOPROLOL TARTRATE 5 MG/5ML IV SOLN
5.0000 mg | Freq: Three times a day (TID) | INTRAVENOUS | Status: DC
  Administered 2021-07-30 – 2021-08-01 (×7): 5 mg via INTRAVENOUS
  Filled 2021-07-30 (×7): qty 5

## 2021-07-30 NOTE — Progress Notes (Addendum)
PROGRESS NOTE    Corey [name]  JOA:[id number] DOB: 1946-06-08 DOA: 07/28/2021 PCP: [name], MD   Chief Complaint  Patient presents with   Fatigue   Shortness of Breath    Brief Narrative:  Patient pleasant 75 year old gentleman history of non-small cell lung cancer on chemotherapy, OSA, A. fib, coronary artery disease presented with left lower extremity swelling and generalized weakness.  Patient noted to have recently been treated with Levaquin for pneumonia which she completed the day prior to his erythema.  Patient presented with shortness of breath from pneumonia.  Chest x-ray done showed unchanged left lower lobe opacity, no fevers recorded, patient on also noted with a left lower extremity cellulitis.  Patient placed on IV vancomycin cefepime and admitted for further evaluation and management.  Patient noted the night of admission 12 intractable nausea and vomiting and abdominal films and chest x-ray ordered.   Assessment & Plan:   Principal Problem:   Cellulitis of left leg Active Problems:   Benign essential HTN   Dyslipidemia   AF (paroxysmal atrial fibrillation) (HCC)   OSA on CPAP   Hyponatremia   Essential hypertension   History of stroke   Primary cancer of left lower lobe of lung (HCC)   CAP (community acquired pneumonia)   Malaise   Hypokalemia   Hypomagnesemia   Nausea and vomiting  #1 left lower extremity cellulitis -Patient with slight improvement left lower extremity cellulitis. -Currently on IV vancomycin and IV Rocephin which we will continue. -Blood cultures ordered and pending. -Supportive care.  2.  Nausea vomiting -Patient noted to have intractable nausea vomiting all night and early this morning. -Given IV Zofran -Patient given IV Compazine this morning and currently resting. -Patient states last bowel movement was yesterday. -Continue IV Zofran, IV Compazine as needed. -Abdominal films with nonobstructive bowel gas pattern, mild  gaseous distention of the stomach.   -Chest x-ray with new faint patchy opacity right midlung, differential includes mild atelectasis, aspiration or pneumonia.  Stable volume loss and patchy consolidation in the mid to lower left lung compatible with posttreatment change. -Patient initially placed on n.p.o. pending abdominal films, abdominal films with no obstructive pattern. -SLP evaluation pending - trial of clears. -If further ongoing nausea and vomiting may need to place NG tube  3.  Probable aspiration pneumonia /CAP -Patient noted with intractable nausea and vomiting overnight and early this morning. -Chest x-ray done with new faint patchy opacity right midlung, stable volume loss and patchy consolidation in the mid to lower left lung compatible with posttreatment change. -Continue IV vancomycin, IV Rocephin. -Start IV azithromycin. -Continue Mucinex, Brovana and Incruse nebs, Flonase.  4.  Hypokalemia/hypomagnesemia/hyponatremia -Improving with hydration. -Potassium repleted currently at 3.5 however will give 4 rounds of IV K. Dur and place potassium and IV fluids as patient on Tikosyn for A. fib. -Magnesium at 2.0. -Repeat labs in the morning.  5.  Stage IV non-small cell lung cancer -On palliative chemotherapy per oncology, Dr. Lorna Few. -Continue nebs/inhalers. -Patient states was post to have some staging scans done and wondering whether they can be done whilst he is hospitalized. -We will defer staging scans to oncology. -Oncology informed of admission via epic.  6.  Paroxysmal atrial fibrillation -Patient with intractable nausea vomiting unable to tolerate any oral intake this morning. -Change metoprolol to IV metoprolol. -Resume Tikosyn when able to tolerate oral intake. -Due to ongoing nausea and vomiting we will discontinue Xarelto and placed on Lovenox until able to tolerate oral intake  and then subsequently placed back on Xarelto.  7.   Hyperlipidemia -Continue Lipitor, fenofibrate.  8.  Coronary artery disease/hypertension -Metoprolol.  9.  Parkinson's -Continue home regimen Sinemet.  10.  Prolonged QT -Repeat EKG with resolution of QT prolongation currently at 467 from 530 on admission. -Keep potassium approximately at 4, magnesium approximately 2. -Follow.  11.  OSA -Due to intractable nausea and vomiting overnight we will hold CPAP until nausea and vomiting have resolved.  12.  History of CVA -Continue anticoagulation, statin.  13.  Generalized weakness -PT/OT.   DVT prophylaxis: Lovenox full dose as patient with nausea and emesis Code Status: DNR Family Communication: Updated patient, brother at bedside Disposition:   Status is: Inpatient  Remains inpatient appropriate because: Inpatient treatment necessary.  Patient noted to have nausea and vomiting overnight with inability to keep anything down.       Consultants:  Oncology informed of admission via epic.  Procedures:  Abdominal x-ray 07/30/2021 Chest x-ray 07/30/2021 Bilateral lower extremity Dopplers 07/30/2021  Antimicrobials:  IV azithromycin 07/30/2021>>>>>> IV cefepime 1025 2022x1 dose IV Rocephin 07/29/2021>>>>> IV vancomycin 07/28/2021>>>>>>   Subjective: States had brown emesis last night.  Patient noted to have emesis most of the night per RN and patient had last bout of emesis this morning around 8 AM.  Denies any chest pain.  No significant shortness of breath.  Denies any significant abdominal pain.  Stated had bowel movement yesterday.  Objective: Vitals:   07/29/21 2058 07/29/21 2149 07/30/21 0528 07/30/21 1232  BP:  140/80 137/88 117/77  Pulse:  94 99 92  Resp:  (!) 22 (!) 22 13  Temp:  99.5 F (37.5 C) 98 F (36.7 C) 97.8 F (36.6 C)  TempSrc:  Oral Oral Oral  SpO2: 94% 92% 90% 96%  Weight:      Height:        Intake/Output Summary (Last 24 hours) at 07/30/2021 1727 Last data filed at 07/30/2021  1421 Gross per 24 hour  Intake 1492.46 ml  Output 2840 ml  Net -1347.54 ml   Filed Weights   07/28/21 1046 07/29/21 0510  Weight: 108 kg 102.1 kg    Examination:  General exam: Appears calm and comfortable  Respiratory system: Left coarse basilar breath sounds.  No wheezing.  Fair air movement.  Cardiovascular system: S1 & S2 heard, RRR. No JVD, murmurs, rubs, gallops or clicks. No pedal edema. Gastrointestinal system: Abdomen is nondistended, soft and nontender. No organomegaly or masses felt. Normal bowel sounds heard. Central nervous system: Alert and oriented. No focal neurological deficits. Extremities: Left lower extremity with demarcated cellulitis. Skin: No rashes, lesions or ulcers Psychiatry: Judgement and insight appear normal. Mood & affect appropriate.     Data Reviewed: I have personally reviewed following labs and imaging studies  CBC: Recent Labs  Lab 07/28/21 1102 07/29/21 0648 07/30/21 0500  WBC 6.9 7.0 8.9  NEUTROABS  --  5.9  --   HGB 9.0* 8.1* 8.6*  HCT 27.4* 25.1* 26.2*  MCV 91.6 96.2 94.9  PLT 59* 73* 115*    Basic Metabolic Panel: Recent Labs  Lab 07/28/21 1109 07/29/21 0648 07/30/21 0500  NA 126* 129* 134*  K 3.7 3.4* 3.5  CL 96* 99 101  CO2 21* 21* 24  GLUCOSE 110* 109* 123*  BUN 16 14 14   CREATININE 0.71 0.73 0.66  CALCIUM 9.0 8.3* 8.2*  MG  --  1.6* 2.0    GFR: Estimated Creatinine Clearance: 102.6 mL/min (by C-G formula based  on SCr of 0.66 mg/dL).  Liver Function Tests: Recent Labs  Lab 07/28/21 1109 07/29/21 0648 07/30/21 0500  AST 27 27 38  ALT 16 15 14   ALKPHOS 56 49 54  BILITOT 0.8 0.8 0.8  PROT 6.8 6.4* 6.7  ALBUMIN 3.0* 2.2* 2.3*    CBG: No results for input(s): GLUCAP in the last 168 hours.   Recent Results (from the past 240 hour(s))  Blood culture (routine single)     Status: None (Preliminary result)   Collection Time: 07/28/21 11:44 AM   Specimen: BLOOD LEFT ARM  Result Value Ref Range Status    Specimen Description   Final    BLOOD LEFT ARM Performed at [facility], [address], [place], [place] [address]    Special Requests   Final    Blood Culture adequate volume Performed at [facility], [address] [place], [place] [address]    Culture   Final    NO GROWTH 2 DAYS Performed at [facility], [place] [address] [place], [place] [address]    Report Status PENDING  Incomplete  Culture, blood (routine x 2)     Status: None (Preliminary result)   Collection Time: 07/28/21  2:03 PM   Specimen: BLOOD  Result Value Ref Range Status   Specimen Description   Final    BLOOD CENTRAL LINE BLOOD RIGHT ARM Performed at [facility], [address] [place], [place] [address]    Special Requests   Final    BOTTLES DRAWN AEROBIC AND ANAEROBIC Blood Culture adequate volume Performed at [facility], [address], [place], [place] [address]    Culture   Final    NO GROWTH 2 DAYS Performed at [facility], [place] [address], [place], [place] [address]    Report Status PENDING  Incomplete  Resp Panel by RT-PCR (Flu A&B, Covid) Nasopharyngeal Swab     Status: None   Collection Time: 07/28/21  3:35 PM   Specimen: Nasopharyngeal Swab; Nasopharyngeal(NP) swabs in vial transport medium  Result Value Ref Range Status   SARS Coronavirus 2 by RT PCR NEGATIVE NEGATIVE Final    Comment: (NOTE) SARS-CoV-2 target nucleic acids are NOT DETECTED.  The SARS-CoV-2 RNA is generally detectable in upper respiratory specimens during the acute phase of infection. The lowest concentration of SARS-CoV-2 viral copies this assay can detect is 138 copies/mL. A negative result does not preclude SARS-Cov-2 infection and should not be used as the sole basis for treatment or other patient management decisions. A negative result may occur with  improper specimen collection/handling, submission of specimen other than  nasopharyngeal swab, presence of viral mutation(s) within the areas targeted by this assay, and inadequate number of viral copies(<138 copies/mL). A negative result must be combined with clinical observations, patient history, and epidemiological information. The expected result is Negative.  Fact Sheet for Patients:  EntrepreneurPulse.com.au  Fact Sheet for Healthcare Providers:  IncredibleEmployment.be  This test is no t yet approved or cleared by the [place] FDA and  has been authorized for detection and/or diagnosis of SARS-CoV-2 by FDA under an Emergency Use Authorization (EUA). This EUA will remain  in effect (meaning this test can be used) for the duration of the COVID-19 declaration under Section 564(b)(1) of the Act, 21 U.S.C.section 360bbb-3(b)(1), unless the authorization is terminated  or revoked sooner.       Influenza A by PCR NEGATIVE NEGATIVE Final   Influenza B by PCR NEGATIVE  NEGATIVE Final    Comment: (NOTE) The Xpert Xpress SARS-CoV-2/FLU/RSV plus assay is intended as an aid in the diagnosis of influenza from Nasopharyngeal swab specimens and should not be used as a sole basis for treatment. Nasal washings and aspirates are unacceptable for Xpert Xpress SARS-CoV-2/FLU/RSV testing.  Fact Sheet for Patients: EntrepreneurPulse.com.au  Fact Sheet for Healthcare Providers: IncredibleEmployment.be  This test is not yet approved or cleared by the [place] FDA and has been authorized for detection and/or diagnosis of SARS-CoV-2 by FDA under an Emergency Use Authorization (EUA). This EUA will remain in effect (meaning this test can be used) for the duration of the COVID-19 declaration under Section 564(b)(1) of the Act, 21 U.S.C. section 360bbb-3(b)(1), unless the authorization is terminated or revoked.  Performed at [facility], [address], [place], [place] [address]   Expectorated Sputum Assessment w Gram Stain, Rflx to Resp Cult     Status: None   Collection Time: 07/30/21  7:57 AM   Specimen: Sputum  Result Value Ref Range Status   Specimen Description SPUTUM  Final   Special Requests NONE  Final   Sputum evaluation   Final    THIS SPECIMEN IS ACCEPTABLE FOR SPUTUM CULTURE Performed at [facility], [place] [address] [place], [place] [address]    Report Status 07/30/2021 FINAL  Final  Culture, Respiratory w Gram Stain     Status: None (Preliminary result)   Collection Time: 07/30/21  7:57 AM   Specimen: SPU  Result Value Ref Range Status   Specimen Description   Final    SPUTUM Performed at [facility] [address] [place], [place] [address]    Special Requests   Final    NONE Reflexed from [id number] Performed at [facility] [address], [place], [place] [address]    Gram Stain   Final    RARE SQUAMOUS EPITHELIAL CELLS PRESENT RARE WBC PRESENT, PREDOMINANTLY PMN RARE GRAM NEGATIVE RODS Performed at [facility], [place] [address] [place],  [address]    Culture PENDING  Incomplete   Report Status PENDING  Incomplete         Radiology Studies: DG Chest 2 View  Result Date: 07/30/2021 CLINICAL DATA:  Stage IV lung cancer, nausea and vomiting EXAM: CHEST - 2 VIEW COMPARISON:  07/28/2021 chest radiograph. FINDINGS: Stable configuration of right internal jugular Port-A-Cath and left chest loop recorder. Stable cardiomediastinal silhouette with top-normal heart size. No pneumothorax. Chronic mild blunting of the left costophrenic angle. No right pleural effusion. Volume loss and patchy consolidation in the mid to lower left lung, unchanged. No overt pulmonary edema. New faint patchy opacity in the right mid lung. IMPRESSION: 1. New faint patchy opacity in the right mid lung, differential includes mild atelectasis, aspiration or pneumonia.  2. Stable volume loss and patchy consolidation in the mid to lower left lung compatible with post treatment change. Electronically Signed   By: Ilona Sorrel M.D.   On: 07/30/2021 13:34   DG Abd 2 Views  Result Date: 07/30/2021 CLINICAL DATA:  Nausea, vomiting, stage IV lung cancer EXAM: ABDOMEN - 2 VIEW COMPARISON:  03/24/2021 PET-CT FINDINGS: Loop recorder overlies the left heart. Bilateral posterior spinal fusion hardware at L5-S1. Fiducial markers overlie the region of the prostate. Mild gaseous distention of the stomach. No disproportionately dilated small bowel loops or significant air-fluid levels. Mild-to-moderate colonic gas in stool. No evidence of pneumatosis or pneumoperitoneum. No radiopaque nephrolithiasis. Marked  lumbar spondylosis. Nonspecific patchy left lung base opacity. IMPRESSION: Nonobstructive bowel gas pattern. Mild gaseous distention of the stomach. Electronically Signed   By: Ilona Sorrel M.D.   On: 07/30/2021 13:31   VAS [place] LOWER EXTREMITY VENOUS (DVT)  Result Date: 07/30/2021  Lower Venous DVT Study Patient Name:  BENNEY SOMMERVILLE  Date of Exam:   07/30/2021 Medical Rec #: [id number]    Accession #:    [id number] Date of Birth: 1946-06-15   Patient Gender: M Patient Age:   30 years Exam Location:  [facility] Procedure:      VAS [place] LOWER EXTREMITY VENOUS (DVT) Referring Phys: Quillian Quince Elick Aguilera --------------------------------------------------------------------------------  Indications: LLE cellulitis.  Risk Factors: CA patient on chemotherapy. Limitations: Poor ultrasound/tissue interface. Comparison Study: No previous exams Performing Technologist: Jody Hill RVT, RDMS  Examination Guidelines: A complete evaluation includes B-mode imaging, spectral Doppler, color Doppler, and power Doppler as needed of all accessible portions of each vessel. Bilateral testing is considered an integral part of a complete examination. Limited examinations for reoccurring indications may be  performed as noted. The reflux portion of the exam is performed with the patient in reverse Trendelenburg.  +---------+---------------+---------+-----------+----------+--------------+ RIGHT    CompressibilityPhasicitySpontaneityPropertiesThrombus Aging +---------+---------------+---------+-----------+----------+--------------+ CFV      Full           Yes      Yes                                 +---------+---------------+---------+-----------+----------+--------------+ SFJ      Full                                                        +---------+---------------+---------+-----------+----------+--------------+ FV Prox  Full           Yes      Yes                                 +---------+---------------+---------+-----------+----------+--------------+ FV Mid   Full           Yes      Yes                                 +---------+---------------+---------+-----------+----------+--------------+ FV DistalFull           Yes      Yes                                 +---------+---------------+---------+-----------+----------+--------------+ PFV      Full                                                        +---------+---------------+---------+-----------+----------+--------------+ POP      Full           Yes      Yes                                 +---------+---------------+---------+-----------+----------+--------------+  PTV      Full                                                        +---------+---------------+---------+-----------+----------+--------------+ PERO     Full                                                        +---------+---------------+---------+-----------+----------+--------------+   +---------+---------------+---------+-----------+----------+--------------+ LEFT     CompressibilityPhasicitySpontaneityPropertiesThrombus Aging +---------+---------------+---------+-----------+----------+--------------+ CFV      Full            Yes      Yes                                 +---------+---------------+---------+-----------+----------+--------------+ SFJ      Full                                                        +---------+---------------+---------+-----------+----------+--------------+ FV Prox  Full           Yes      Yes                                 +---------+---------------+---------+-----------+----------+--------------+ FV Mid   Full           Yes      Yes                                 +---------+---------------+---------+-----------+----------+--------------+ FV DistalFull           Yes      Yes                                 +---------+---------------+---------+-----------+----------+--------------+ PFV      Full                                                        +---------+---------------+---------+-----------+----------+--------------+ POP      Full           Yes      Yes                                 +---------+---------------+---------+-----------+----------+--------------+ PTV      Full                                                        +---------+---------------+---------+-----------+----------+--------------+  PERO     Full                                                        +---------+---------------+---------+-----------+----------+--------------+    Summary: BILATERAL: - No evidence of deep vein thrombosis seen in the lower extremities, bilaterally. - No evidence of superficial venous thrombosis in the lower extremities, bilaterally. -No evidence of popliteal cyst, bilaterally.   *See table(s) above for measurements and observations.    Preliminary         Scheduled Meds:  acidophilus   Oral Daily   arformoterol  15 mcg Nebulization BID   And   umeclidinium bromide  1 puff Inhalation Daily   atorvastatin  40 mg Oral Daily   carbidopa-levodopa  1 tablet Oral 3 times per day   chlorhexidine  15 mL Mouth Rinse BID    Chlorhexidine Gluconate Cloth  6 each Topical Daily   dofetilide  500 mcg Oral BID   enoxaparin (LOVENOX) injection  100 mg Subcutaneous Q12H   fenofibrate  160 mg Oral Daily   fluticasone  2 spray Each Nare Daily   folic acid  1 mg Intravenous Daily   guaiFENesin  1,200 mg Oral BID   mouth rinse  15 mL Mouth Rinse q12n4p   metoprolol tartrate  5 mg Intravenous Q8H   multivitamin with minerals  1 tablet Oral Daily   pantoprazole (PROTONIX) IV  40 mg Intravenous Q24H   tamsulosin  0.4 mg Oral QPC supper   topiramate  100 mg Oral BID   Continuous Infusions:  0.9 % NaCl with KCl 40 mEq / L 100 mL/hr at 07/30/21 0915   azithromycin 500 mg (07/30/21 0919)   cefTRIAXone (ROCEPHIN)  IV 2 g (07/30/21 1120)   vancomycin 1,000 mg (07/30/21 1242)     LOS: 1 day    Time spent: 40 minutes    Irine Seal, MD [facility]   To contact the attending provider between 7A-7P or the covering provider during after hours 7P-7A, please log into the web site [email or web address] and access using universal [place] password for that web site. If you do not have the password, please call the hospital operator.  07/30/2021, 5:27 PM

## 2021-07-30 NOTE — Plan of Care (Signed)

## 2021-07-30 NOTE — Progress Notes (Signed)
BLE venous duplex has been completed.  Results can be found under chart review under CV PROC. 07/30/2021 10:12 AM Sharmane Dame RVT, RDMS

## 2021-07-30 NOTE — Progress Notes (Signed)
SLP Cancellation Note  Patient Details Name: Corey [name] MRN: [id number] DOB: [birth year]   Cancelled treatment:       Reason Eval/Treat Not Completed: Medical issues which prohibited therapy;Fatigue/lethargy limiting ability to participate  Unable to complete BSE at this time, as pt is currently resting/sedated following extended voluminous vomiting throughout last night, and continuing until after 9 this morning. RN reports pt has received Compazine, and is currently resting.   RN did not indicate s/s aspiration when drinking Ensure, water, and ginger ale before vomiting started. SLP will continue efforts.  Suhaylah Wampole B. Quentin Ore, [facility], [place] Speech Language Pathologist Office: [phone]   Shonna Chock 07/30/2021, 12:40 PM

## 2021-07-30 NOTE — Progress Notes (Addendum)
ANTICOAGULATION CONSULT NOTE - Initial Consult  Pharmacy Consult for: Lovenox Indication: atrial fibrillation  Allergies  Allergen Reactions   Amoxicillin Swelling    Tolerates Rocephin   Fosinopril Other (See Comments)   Hydromorphone Nausea Only, Other (See Comments) and Nausea And Vomiting   Ampicillin Rash   Monopril [Fosinopril Sodium] Other (See Comments)    Muscle aches & pain   Other Other (See Comments)   Rosuvastatin Other (See Comments)    Joint pain    Patient Measurements: Height: 6' 1.5" (186.7 cm) Weight: 102.1 kg (225 lb 1.4 oz) IBW/kg (Calculated) : 81.05 Heparin Dosing Weight:   Vital Signs: Temp: 98 F (36.7 C) (10/27 0528) Temp Source: Oral (10/27 0528) BP: 137/88 (10/27 0528) Pulse Rate: 99 (10/27 0528)  Labs: Recent Labs    07/28/21 1102 07/28/21 1109 07/29/21 0648 07/30/21 0500  HGB 9.0*  --  8.1* 8.6*  HCT 27.4*  --  25.1* 26.2*  PLT 59*  --  73* 115*  APTT  --  47*  --   --   LABPROT  --  19.1*  --   --   INR  --  1.6*  --   --   CREATININE  --  0.71 0.73 0.66    Estimated Creatinine Clearance: 102.6 mL/min (by C-G formula based on SCr of 0.66 mg/dL).   Medical History: Past Medical History:  Diagnosis Date   Anemia    Anxiety    Arthritis    "back, right knee, hands, ankles, neck" (05/31/2016)   Carotid artery disease ([place]) 08/30/2016   Carotid US 11/19: R 1-39, L 40-59 // Carotid US 08/2019: R 1-39; L 40-59 // Carotid US 11/21: R 1-39; L 40-59>> repeat 1 year    Chronic lower back pain    Coronary atherosclerosis of native coronary artery    a. BMS to [facility] 2004 and 2007, otherwise mild nonobstructive disease. EF normal.   Diverticulitis    Dyslipidemia    Dyspnea    Essential hypertension, benign    Family history of breast cancer    Family history of lung cancer    Family history of prostate cancer    GERD (gastroesophageal reflux disease)    Lumbar radiculopathy, chronic 02/04/2015   Right L5   Obesity    OSA on  CPAP    uses cpap   Paroxysmal atrial fibrillation ([place])    a. Discovered after stroke.   Pneumonia 01/1996   PONV (postoperative nausea and vomiting)    Prostate CA ([place])    Recurrent upper respiratory infection (URI)    Stroke ([place]) 07/2012   no deficits   Visit for monitoring Tikosyn therapy 09/18/2019    Medications:  Medications Prior to Admission  Medication Sig Dispense Refill Last Dose   acetaminophen (TYLENOL 8 HOUR ARTHRITIS PAIN) 650 MG CR tablet Take 1,300 mg by mouth every 8 (eight) hours as needed for pain (pain).   07/28/2021   albuterol (VENTOLIN HFA) 108 (90 Base) MCG/ACT inhaler Inhale 2 puffs into the lungs every 4 (four) hours as needed for wheezing or shortness of breath. Inhale 2 puffs into the lungs every 4 hours as needed 18 g 0 07/28/2021   ALPRAZolam (XANAX) 0.5 MG tablet Take 1 tablet (0.5 mg total) by mouth at bedtime as needed for anxiety. 10 tablet 0 07/28/2021   atorvastatin (LIPITOR) 40 MG tablet Take 1 tablet (40 mg total) by mouth daily. 90 tablet 3 07/27/2021   benzonatate (TESSALON) 200 MG capsule TAKE  1 CAPSULE (200 MG TOTAL) BY MOUTH 3 (THREE) TIMES DAILY AS NEEDED FOR COUGH. 60 capsule 1 07/28/2021   carbidopa-levodopa (SINEMET IR) 25-100 MG tablet Take 1 tablet by mouth 3 (three) times daily. (Patient taking differently: Take 1 tablet by mouth 3 (three) times daily. 0800, 1200, 1600) 270 tablet 1 07/27/2021   Carbinoxamine Maleate 6 MG TABS Take 1 tablet by mouth 2 (two) times daily as needed (drainage). 60 tablet 4 07/28/2021   chlorpheniramine-HYDROcodone (TUSSIONEX) 10-8 MG/5ML SUER Take 5 mLs by mouth every 12 (twelve) hours as needed for cough. 473 mL 0 07/27/2021   dofetilide (TIKOSYN) 500 MCG capsule Take 1 capsule (500 mcg total) by mouth 2 (two) times daily. Please keep upcoming appointment in February for future refills. Thank you 180 capsule 3 07/28/2021   fenofibrate 160 MG tablet TAKE 1 TABLET DAILY. PLEASE KEEP UPCOMING APPT IN MARCH WITH  DR. Acie Fredrickson BEFORE ANYMORE REFILLS. (Patient taking differently: Take 160 mg by mouth daily.) 90 tablet 3 07/27/2021   fluticasone (FLONASE) 50 MCG/ACT nasal spray Place 2 sprays into both nostrils daily. 18.2 mL 5 38/07/1750   folic acid (FOLVITE) 1 MG tablet TAKE 1 TABLET BY MOUTH EVERY DAY (Patient taking differently: Take 1 mg by mouth daily.) 90 tablet 1 07/28/2021   ipratropium (ATROVENT) 0.03 % nasal spray Place 1-2 sprays into both nostrils every 12 (twelve) hours. For drainage 30 mL 5 07/28/2021   lidocaine-prilocaine (EMLA) cream Apply 1 application topically as needed. (Patient taking differently: Apply 1 application topically as needed (treatment).) 30 g 2 07/28/2021   magnesium oxide (MAG-OX) 400 (240 Mg) MG tablet TAKE 1 TABLET BY MOUTH EVERY DAY (Patient taking differently: Take 400 mg by mouth daily.) 90 tablet 2 07/28/2021   metoprolol succinate (TOPROL-XL) 25 MG 24 hr tablet TAKE 1 TABLET BY MOUTH EVERY DAY (Patient taking differently: Take 25 mg by mouth daily.) 90 tablet 1 07/27/2021   Multiple Vitamins-Minerals (MULTIVITAMINS THER. W/MINERALS) TABS Take 1 tablet by mouth daily.   07/27/2021   omeprazole (PRILOSEC) 20 MG capsule Take 1 capsule (20 mg total) by mouth daily. 30 capsule 5 07/27/2021   ondansetron (ZOFRAN) 8 MG tablet Take 1 tablet (8 mg total) by mouth every 8 (eight) hours as needed for nausea or vomiting. Starting 3 days after chemotherapy 30 tablet 2 07/27/2021   potassium chloride (KLOR-CON) 10 MEQ tablet TAKE 1 TABLET BY MOUTH EVERY DAY (Patient taking differently: Take 10 mEq by mouth daily.) 90 tablet 1 07/27/2021   Saccharomyces boulardii (PROBIOTIC) 250 MG CAPS Take 250 mg by mouth daily.   07/27/2021   tamsulosin (FLOMAX) 0.4 MG CAPS capsule Take 0.4 mg by mouth daily after supper.   07/27/2021   Tiotropium Bromide-Olodaterol (STIOLTO RESPIMAT) 2.5-2.5 MCG/ACT AERS Inhale 2 puffs into the lungs daily. 4 g 5 07/27/2021   topiramate (TOPAMAX) 50 MG tablet Take 2  tablets (100 mg total) by mouth 2 (two) times daily. 360 tablet 3 07/28/2021   traZODone (DESYREL) 50 MG tablet Take 0.5 tablets (25 mg total) by mouth at bedtime as needed for sleep. 45 tablet 3 07/27/2021   Vitamin D, Ergocalciferol, (DRISDOL) 1.25 MG (50000 UNIT) CAPS capsule TAKE 1 CAPSULE BY MOUTH ONE TIME PER WEEK (Patient taking differently: Take 50,000 Units by mouth every 7 (seven) days. Monday) 12 capsule 1 07/27/2021   XARELTO 20 MG TABS tablet TAKE 1 TABLET BY MOUTH EVERY DAY (Patient taking differently: 20 mg daily with supper.) 90 tablet 2 07/27/2021   diclofenac Sodium (  VOLTAREN) 1 % GEL Apply 4 g topically 4 (four) times daily. (Patient not taking: No sig reported) 100 g 0    loperamide (IMODIUM) 2 MG capsule Take 2 mg by mouth daily as needed for diarrhea or loose stools.      nitroGLYCERIN (NITROSTAT) 0.4 MG SL tablet Place 1 tablet (0.4 mg total) under the tongue every 5 (five) minutes as needed for chest pain. 25 tablet 5    promethazine (PHENERGAN) 25 MG tablet Take 1 tablet (25 mg total) by mouth every 6 (six) hours as needed for nausea or vomiting. 30 tablet 1    solifenacin (VESICARE) 5 MG tablet Take 5 mg by mouth at bedtime.      traMADol (ULTRAM) 50 MG tablet Take 1 tablet (50 mg total) by mouth every 6 (six) hours as needed for moderate pain. 30 tablet 0     Assessment: 75 yo M on xarelto 20 mg qday PTA for Afib. Last dose 10/26 @ 1800.   Hg 8.6; PLT low since admit but up to 115 today (recent chemotherapy 07/14/21 for NSCLC).  No bleeding reported.  Pt to start [facility] for bridge therapy while NPO.   Goal of Therapy:  Anti-Xa level 0.6-1 units/ml 4hrs after LMWH dose given Monitor platelets by anticoagulation protocol: Yes   Plan:  Lovenox 1 mg/kg = 100 mg sq q12h while NPO First dose tonight F/u CBC, renal function, ability to resume PO meds   Eudelia Bunch, Pharm.D 07/30/2021 12:00 PM

## 2021-07-31 ENCOUNTER — Inpatient Hospital Stay (HOSPITAL_COMMUNITY)
Admission: RE | Admit: 2021-07-31 | Discharge: 2021-07-31 | Disposition: A | Discharge status: Home / Self Care | Payer: Medicare PPO | Source: Ambulatory Visit | Attending: Internal Medicine | Admitting: Internal Medicine

## 2021-07-31 DIAGNOSIS — J189 Pneumonia, unspecified organism: Secondary | ICD-10-CM | POA: Diagnosis not present

## 2021-07-31 DIAGNOSIS — I10 Essential (primary) hypertension: Secondary | ICD-10-CM | POA: Diagnosis not present

## 2021-07-31 DIAGNOSIS — C3490 Malignant neoplasm of unspecified part of unspecified bronchus or lung: Secondary | ICD-10-CM | POA: Diagnosis not present

## 2021-07-31 DIAGNOSIS — I480 Paroxysmal atrial fibrillation: Secondary | ICD-10-CM | POA: Diagnosis not present

## 2021-07-31 DIAGNOSIS — L03116 Cellulitis of left lower limb: Secondary | ICD-10-CM | POA: Diagnosis not present

## 2021-07-31 LAB — CBC WITH DIFFERENTIAL/PLATELET
Abs Immature Granulocytes: 0.08 10*3/uL — ABNORMAL HIGH (ref 0.00–0.07)
Basophils Absolute: 0 10*3/uL (ref 0.0–0.1)
Basophils Relative: 0 %
Eosinophils Absolute: 0 10*3/uL (ref 0.0–0.5)
Eosinophils Relative: 0 %
HCT: 23.2 % — ABNORMAL LOW (ref 39.0–52.0)
Hemoglobin: 7.3 g/dL — ABNORMAL LOW (ref 13.0–17.0)
Immature Granulocytes: 1 %
Lymphocytes Relative: 2 %
Lymphs Abs: 0.2 10*3/uL — ABNORMAL LOW (ref 0.7–4.0)
MCH: 30.7 pg (ref 26.0–34.0)
MCHC: 31.5 g/dL (ref 30.0–36.0)
MCV: 97.5 fL (ref 80.0–100.0)
Monocytes Absolute: 0.8 10*3/uL (ref 0.1–1.0)
Monocytes Relative: 10 %
Neutro Abs: 6.7 10*3/uL (ref 1.7–7.7)
Neutrophils Relative %: 87 %
Platelets: 128 10*3/uL — ABNORMAL LOW (ref 150–400)
RBC: 2.38 MIL/uL — ABNORMAL LOW (ref 4.22–5.81)
RDW: 19.4 % — ABNORMAL HIGH (ref 11.5–15.5)
WBC: 7.8 10*3/uL (ref 4.0–10.5)
nRBC: 0 % (ref 0.0–0.2)

## 2021-07-31 LAB — RENAL FUNCTION PANEL
Albumin: 1.8 g/dL — ABNORMAL LOW (ref 3.5–5.0)
Anion gap: 9 (ref 5–15)
BUN: 13 mg/dL (ref 8–23)
CO2: 21 mmol/L — ABNORMAL LOW (ref 22–32)
Calcium: 8.1 mg/dL — ABNORMAL LOW (ref 8.9–10.3)
Chloride: 103 mmol/L (ref 98–111)
Creatinine, Ser: 0.58 mg/dL — ABNORMAL LOW (ref 0.61–1.24)
GFR, Estimated: >60 mL/min (ref >60)
Glucose, Bld: 90 mg/dL (ref 70–99)
Phosphorus: 2.3 mg/dL — ABNORMAL LOW (ref 2.5–4.6)
Potassium: 4.1 mmol/L (ref 3.5–5.1)
Sodium: 133 mmol/L — ABNORMAL LOW (ref 135–145)

## 2021-07-31 LAB — LEGIONELLA PNEUMOPHILA SEROGP 1 UR AG: L. pneumophila Serogp 1 Ur Ag: NEGATIVE

## 2021-07-31 LAB — MAGNESIUM: Magnesium: 1.8 mg/dL (ref 1.7–2.4)

## 2021-07-31 LAB — MRSA NEXT GEN BY PCR, NASAL: MRSA by PCR Next Gen: NOT DETECTED

## 2021-07-31 MED ORDER — IOHEXOL 9 MG/ML PO SOLN
500.0000 mL | ORAL | Status: AC
  Administered 2021-07-31: 500 mL via ORAL

## 2021-07-31 MED ORDER — SODIUM PHOSPHATES 45 MMOLE/15ML IV SOLN
25.0000 mmol | Freq: Once | INTRAVENOUS | Status: AC
  Administered 2021-07-31: 25 mmol via INTRAVENOUS
  Filled 2021-07-31: qty 8.33

## 2021-07-31 MED ORDER — IOHEXOL 350 MG/ML SOLN
80.0000 mL | Freq: Once | INTRAVENOUS | Status: AC | PRN
  Administered 2021-07-31: 80 mL via INTRAVENOUS

## 2021-07-31 MED ORDER — MAGNESIUM SULFATE 2 GM/50ML IV SOLN
2.0000 g | Freq: Once | INTRAVENOUS | Status: AC
  Administered 2021-07-31: 2 g via INTRAVENOUS
  Filled 2021-07-31: qty 50

## 2021-07-31 MED ORDER — IPRATROPIUM BROMIDE 0.02 % IN SOLN: 0.5000 mg | Freq: Three times a day (TID) | RESPIRATORY_TRACT | Status: DC

## 2021-07-31 MED ORDER — IPRATROPIUM BROMIDE 0.02 % IN SOLN
0.5000 mg | Freq: Three times a day (TID) | RESPIRATORY_TRACT | Status: DC
  Administered 2021-07-31 – 2021-08-08 (×24): 0.5 mg via RESPIRATORY_TRACT
  Filled 2021-07-31 (×24): qty 2.5

## 2021-07-31 MED ORDER — IOHEXOL 9 MG/ML PO SOLN
ORAL | Status: AC
  Administered 2021-07-31: 500 mL via ORAL
  Filled 2021-07-31: qty 1000

## 2021-07-31 MED ORDER — LEVALBUTEROL HCL 0.63 MG/3ML IN NEBU: 0.6300 mg | INHALATION_SOLUTION | Freq: Three times a day (TID) | RESPIRATORY_TRACT | Status: DC

## 2021-07-31 MED ORDER — PANTOPRAZOLE SODIUM 40 MG PO TBEC
40.0000 mg | DELAYED_RELEASE_TABLET | Freq: Every day | ORAL | Status: DC
  Administered 2021-07-31 – 2021-08-12 (×13): 40 mg via ORAL
  Filled 2021-07-31 (×13): qty 1

## 2021-07-31 MED ORDER — FOLIC ACID 1 MG PO TABS
1.0000 mg | ORAL_TABLET | Freq: Every day | ORAL | Status: DC
  Administered 2021-07-31 – 2021-08-12 (×13): 1 mg via ORAL
  Filled 2021-07-31 (×13): qty 1

## 2021-07-31 MED ORDER — ALUM & MAG HYDROXIDE-SIMETH 200-200-20 MG/5ML PO SUSP
30.0000 mL | ORAL | Status: DC | PRN
  Administered 2021-07-31: 30 mL via ORAL
  Filled 2021-07-31 (×2): qty 30

## 2021-07-31 MED ORDER — AZITHROMYCIN 250 MG PO TABS
500.0000 mg | ORAL_TABLET | Freq: Every day | ORAL | Status: DC
  Administered 2021-08-01 – 2021-08-03 (×3): 500 mg via ORAL
  Filled 2021-07-31 (×3): qty 2

## 2021-07-31 MED ORDER — LEVALBUTEROL HCL 0.63 MG/3ML IN NEBU
0.6300 mg | INHALATION_SOLUTION | Freq: Three times a day (TID) | RESPIRATORY_TRACT | Status: DC
  Administered 2021-07-31 – 2021-08-08 (×24): 0.63 mg via RESPIRATORY_TRACT
  Filled 2021-07-31 (×24): qty 3

## 2021-07-31 NOTE — TOC Progression Note (Addendum)
Transition of Care [facility]) - Progression Note    Patient Details  Name: KALEI MCKILLOP MRN: [id number] Date of Birth: Dec 06, 1945  [facility] CM/SW Contact  Leeroy Cha, RN Phone Number: 07/31/2021, 7:42 AM  Clinical Narrative:    #1 left lower extremity cellulitis -Patient with slight improvement left lower extremity cellulitis. -Currently on IV vancomycin and IV Rocephin which we will continue. -Blood cultures ordered and pending. -Supportive care.  2.  Nausea vomiting -Patient noted to have intractable nausea vomiting all night and early this morning. -Given IV Zofran -Patient given IV Compazine this morning and currently resting. -Patient states last bowel movement was yesterday. -Continue IV Zofran, IV Compazine as needed. -Abdominal films with nonobstructive bowel gas pattern, mild gaseous distention of the stomach.   -Chest x-ray with new faint patchy opacity right midlung, differential includes mild atelectasis, aspiration or pneumonia.  Stable volume loss and patchy consolidation in the mid to lower left lung compatible with posttreatment change. -Patient initially placed on n.p.o. pending abdominal films, abdominal films with no obstructive pattern. -SLP evaluation pending - trial of clears. -If further ongoing nausea and vomiting may need to place NG tube   3.  Probable aspiration pneumonia /CAP -Patient noted with intractable nausea and vomiting overnight and early this morning. -Chest x-ray done with new faint patchy opacity right midlung, stable volume loss and patchy consolidation in the mid to lower left lung compatible with posttreatment change. -Continue IV vancomycin, IV Rocephin. -Start IV azithromycin. -Continue Mucinex, Brovana and Incruse nebs, Flonase.  Toc plan of care: Following for hhc needs and progression of care. [id number] At [id number] needs pt ot and sw sent to [facility] with [facility] Expected Discharge Plan: Home/Self  Care Barriers to Discharge: Continued Medical Work up  Expected Discharge Plan and Services Expected Discharge Plan: Home/Self Care   Discharge Planning Services: CM Consult   Living arrangements for the past 2 months: Single Family Home                                       Social Determinants of Health (SDOH) Interventions    Readmission Risk Interventions No flowsheet data found.

## 2021-07-31 NOTE — Care Management Important Message (Signed)
Medicare IM printed for W/L Social Work to give to the patient. 

## 2021-07-31 NOTE — Progress Notes (Signed)
PROGRESS NOTE    Corey [name]  ZOX:[id number] DOB: 1946/09/15 DOA: 07/28/2021 PCP: [name], MD   Chief Complaint  Patient presents with   Fatigue   Shortness of Breath    Brief Narrative:  Patient pleasant 75 year old gentleman history of non-small cell lung cancer on chemotherapy, OSA, A. fib, coronary artery disease presented with left lower extremity swelling and generalized weakness.  Patient noted to have recently been treated with Levaquin for pneumonia which she completed the day prior to his erythema.  Patient presented with shortness of breath from pneumonia.  Chest x-ray done showed unchanged left lower lobe opacity, no fevers recorded, patient on also noted with a left lower extremity cellulitis.  Patient placed on IV vancomycin cefepime and admitted for further evaluation and management.  Patient noted the night of admission 12 intractable nausea and vomiting and abdominal films and chest x-ray ordered.   Assessment & Plan:   Principal Problem:   Cellulitis of left leg Active Problems:   Benign essential HTN   Dyslipidemia   AF (paroxysmal atrial fibrillation) (HCC)   OSA on CPAP   Hyponatremia   Essential hypertension   History of stroke   Primary cancer of left lower lobe of lung (HCC)   CAP (community acquired pneumonia)   Malaise   Hypokalemia   Hypomagnesemia   Nausea & vomiting   Cellulitis of left lower extremity  #1 left lower extremity cellulitis -Patient with slight improvement left lower extremity cellulitis. -Currently on IV vancomycin and IV Rocephin. -Discontinue IV vancomycin.  -Continue IV Rocephin.  -Blood cultures with no growth to date.  -Supportive care.   2.  Nausea vomiting -Patient noted to have intractable nausea vomiting the evening of 07/29/2021, morning of 07/30/2021 . -Patient placed on IV Compazine and IV Zofran with clinical improvement.  -Patient stated had a bowel movement.   -Improving clinically with no further  nausea or vomiting and tolerating clear liquids. -Abdominal films with nonobstructive bowel gas pattern, mild gaseous distention of the stomach.   -Chest x-ray with new faint patchy opacity right midlung, differential includes mild atelectasis, aspiration or pneumonia.  Stable volume loss and patchy consolidation in the mid to lower left lung compatible with posttreatment change. -Patient initially placed on n.p.o. pending abdominal films, abdominal films with no obstructive pattern. -Post CT scans will advance to full liquid diet and if tolerated to a soft diet for breakfast. -Supportive care.  3.  Probable aspiration pneumonia /CAP -Patient noted with intractable nausea and vomiting the evening of 07/30/2019 2 in the morning of 07/30/2021.  -Chest x-ray done with new faint patchy opacity right midlung, stable volume loss and patchy consolidation in the mid to lower left lung compatible with posttreatment change. -MRSA PCR negative.   -Discontinue IV vancomycin.   -Continue IV Rocephin, IV azithromycin. -Continue Mucinex, Brovana and Incruse nebs, Flonase.  4.  Hypokalemia/hypomagnesemia/hyponatremia -Improving with hydration. -Potassium repleted currently at 4.1, magnesium at 1.8.   -Magnesium sulfate 2 g IV x1.   -Patient on Tikosyn and as such goal is to keep potassium around 4, magnesium at 2.   -Repeat labs in the morning.    5.  Stage IV non-small cell lung cancer -On palliative chemotherapy per oncology, Dr. Lorna Few. -Continue nebs/inhalers. -Patient states was post to have some staging scans done and wondering whether they can be done whilst he is hospitalized. -Looks like patient's oncologist have ordered staging CT scans which will be done today. -Oncology informed of admission via epic.  6.  Paroxysmal atrial fibrillation -Patient with intractable nausea vomiting unable to tolerate any oral intake this morning. -Continue IV metoprolol for another 24 hours and if  no further nausea or emesis we will transition back to home dose oral Lopressor.   -Tikosyn resumed.   -Continue full dose Lovenox for now and if continued improvement with nausea and vomiting we will transition back to home dose Xarelto tomorrow.   -Supportive care.   7.  Hyperlipidemia -Continue Lipitor, fenofibrate.  8.  Coronary artery disease/hypertension -Continue IV metoprolol and transition back to oral dose of beta-blocker tomorrow if continued improvement.  9.  Parkinson's -Sinemet.  10.  Prolonged QT -Repeat EKG with resolution of QT prolongation currently at 467 from 530 on admission. -Keep potassium approximately at 4, magnesium approximately 2. -Follow.  11.  OSA -Nausea and vomiting improved no further nausea and vomiting noted.  -Resume CPAP nightly.   12.  History of CVA -Continue anticoagulation, statin.  13.  Generalized weakness -PT/OT. -PT recommending SNF however patient and family would prefer to go home with home health therapies. -TOC consulted for home health therapies.   DVT prophylaxis: Lovenox full dose as patient with nausea and emesis if continued improvement with nausea and emesis will transition back to home dose Xarelto tomorrow Code Status: DNR Family Communication: Updated patient, brother at bedside Disposition:   Status is: Inpatient  Remains inpatient appropriate because: Inpatient treatment necessary.  Patient noted to have nausea and vomiting overnight with inability to keep anything down.       Consultants:  Oncology informed of admission via epic.  Procedures:  Abdominal x-ray 07/30/2021 Chest x-ray 07/30/2021 Bilateral lower extremity Dopplers 07/30/2021 CT chest/CT abdomen and pelvis pending 07/31/2021  Antimicrobials:  IV azithromycin 07/30/2021>>>>>> IV cefepime 1025 2022x1 dose IV Rocephin 07/29/2021>>>>> IV vancomycin 07/28/2021>>>>>> 07/31/2021   Subjective: Patient sitting up in chair taking a nap with  CPAP on.  Easily arousable.  Some complaints of occasional shortness of breath, no chest pain, no abdominal pain.  Tolerating clear liquids.  No further bouts of nausea or emesis.  Some productive frothy pinkish sputum.   Wife and son at bedside.  Per patient and wife oncology have ordered CT scans which are pending to be done today.  Objective: Vitals:   07/30/21 1956 07/30/21 2134 07/31/21 0454 07/31/21 0736  BP:  134/78 127/77   Pulse: 88 100 94   Resp: 18 18 19    Temp:  98.8 F (37.1 C) 98.4 F (36.9 C)   TempSrc:  Oral Axillary   SpO2: 91% 92% 94% 95%  Weight:      Height:        Intake/Output Summary (Last 24 hours) at 07/31/2021 1151 Last data filed at 07/31/2021 0900 Gross per 24 hour  Intake 3152.46 ml  Output 3400 ml  Net -247.54 ml    Filed Weights   07/28/21 1046 07/29/21 0510  Weight: 108 kg 102.1 kg    Examination:  General exam: NAD. Respiratory system: Coarse left basilar breath sounds.  No wheezing.  Fair air movement. Cardiovascular system: RRR no murmurs rubs or gallops.  No JVD.  No lower extremity edema. Gastrointestinal system: Abdomen is soft, nontender, nondistended, positive bowel sounds.  No rebound.  No guarding.  Central nervous system: Alert and oriented. No focal neurological deficits. Extremities: Left lower extremity with demarcated cellulitis with decreasing erythema, decreased warmth.. Skin: No rashes, lesions or ulcers Psychiatry: Judgement and insight appear normal. Mood & affect appropriate.  Data Reviewed: I have personally reviewed following labs and imaging studies  CBC: Recent Labs  Lab 07/28/21 1102 07/29/21 0648 07/30/21 0500 07/31/21 0400  WBC 6.9 7.0 8.9 7.8  NEUTROABS  --  5.9  --  6.7  HGB 9.0* 8.1* 8.6* 7.3*  HCT 27.4* 25.1* 26.2* 23.2*  MCV 91.6 96.2 94.9 97.5  PLT 59* 73* 115* 128*     Basic Metabolic Panel: Recent Labs  Lab 07/28/21 1109 07/29/21 0648 07/30/21 0500 07/31/21 0400  NA 126* 129*  134* 133*  K 3.7 3.4* 3.5 4.1  CL 96* 99 101 103  CO2 21* 21* 24 21*  GLUCOSE 110* 109* 123* 90  BUN 16 14 14 13   CREATININE 0.71 0.73 0.66 0.58*  CALCIUM 9.0 8.3* 8.2* 8.1*  MG  --  1.6* 2.0 1.8  PHOS  --   --   --  2.3*     GFR: Estimated Creatinine Clearance: 102.6 mL/min (A) (by C-G formula based on SCr of 0.58 mg/dL (L)).  Liver Function Tests: Recent Labs  Lab 07/28/21 1109 07/29/21 0648 07/30/21 0500 07/31/21 0400  AST 27 27 38  --   ALT 16 15 14   --   ALKPHOS 56 49 54  --   BILITOT 0.8 0.8 0.8  --   PROT 6.8 6.4* 6.7  --   ALBUMIN 3.0* 2.2* 2.3* 1.8*     CBG: No results for input(s): GLUCAP in the last 168 hours.   Recent Results (from the past 240 hour(s))  Blood culture (routine single)     Status: None (Preliminary result)   Collection Time: 07/28/21 11:44 AM   Specimen: BLOOD LEFT ARM  Result Value Ref Range Status   Specimen Description   Final    BLOOD LEFT ARM Performed at [facility], [address], [place], [place] [address]    Special Requests   Final    Blood Culture adequate volume Performed at [facility], [address] [place], [place] [address]    Culture   Final    NO GROWTH 3 DAYS Performed at [facility], [place] [address] [place], [place] [address]    Report Status PENDING  Incomplete  Culture, blood (routine x 2)     Status: None (Preliminary result)   Collection Time: 07/28/21  2:03 PM   Specimen: BLOOD  Result Value Ref Range Status   Specimen Description   Final    BLOOD CENTRAL LINE BLOOD RIGHT ARM Performed at [facility], [address] [place], [place] [address]    Special Requests   Final    BOTTLES DRAWN AEROBIC AND ANAEROBIC Blood Culture adequate volume Performed at [facility], [address] [place], [place] [address]    Culture   Final    NO GROWTH 3 DAYS Performed at [facility], [place] [address],  [place], [place] [address]    Report Status PENDING  Incomplete  Resp Panel by RT-PCR (Flu A&B, Covid) Nasopharyngeal Swab     Status: None   Collection Time: 07/28/21  3:35 PM   Specimen: Nasopharyngeal Swab; Nasopharyngeal(NP) swabs in vial transport medium  Result Value Ref Range Status   SARS Coronavirus 2 by RT PCR NEGATIVE NEGATIVE Final    Comment: (NOTE) SARS-CoV-2 target nucleic acids are NOT DETECTED.  The SARS-CoV-2 RNA is generally detectable in upper respiratory specimens during the acute phase of infection. The lowest concentration of SARS-CoV-2 viral copies this assay can detect is  138 copies/mL. A negative result does not preclude SARS-Cov-2 infection and should not be used as the sole basis for treatment or other patient management decisions. A negative result may occur with  improper specimen collection/handling, submission of specimen other than nasopharyngeal swab, presence of viral mutation(s) within the areas targeted by this assay, and inadequate number of viral copies(<138 copies/mL). A negative result must be combined with clinical observations, patient history, and epidemiological information. The expected result is Negative.  Fact Sheet for Patients:  EntrepreneurPulse.com.au  Fact Sheet for Healthcare Providers:  IncredibleEmployment.be  This test is no t yet approved or cleared by the [place] FDA and  has been authorized for detection and/or diagnosis of SARS-CoV-2 by FDA under an Emergency Use Authorization (EUA). This EUA will remain  in effect (meaning this test can be used) for the duration of the COVID-19 declaration under Section 564(b)(1) of the Act, 21 U.S.C.section 360bbb-3(b)(1), unless the authorization is terminated  or revoked sooner.       Influenza A by PCR NEGATIVE NEGATIVE Final   Influenza B by PCR NEGATIVE NEGATIVE Final    Comment: (NOTE) The Xpert Xpress SARS-CoV-2/FLU/RSV plus assay is  intended as an aid in the diagnosis of influenza from Nasopharyngeal swab specimens and should not be used as a sole basis for treatment. Nasal washings and aspirates are unacceptable for Xpert Xpress SARS-CoV-2/FLU/RSV testing.  Fact Sheet for Patients: EntrepreneurPulse.com.au  Fact Sheet for Healthcare Providers: IncredibleEmployment.be  This test is not yet approved or cleared by the [place] FDA and has been authorized for detection and/or diagnosis of SARS-CoV-2 by FDA under an Emergency Use Authorization (EUA). This EUA will remain in effect (meaning this test can be used) for the duration of the COVID-19 declaration under Section 564(b)(1) of the Act, 21 U.S.C. section 360bbb-3(b)(1), unless the authorization is terminated or revoked.  Performed at [facility], [address], [place], [place] [address]   Expectorated Sputum Assessment w Gram Stain, Rflx to Resp Cult     Status: None   Collection Time: 07/30/21  7:57 AM   Specimen: Sputum  Result Value Ref Range Status   Specimen Description SPUTUM  Final   Special Requests NONE  Final   Sputum evaluation   Final    THIS SPECIMEN IS ACCEPTABLE FOR SPUTUM CULTURE Performed at [facility], [place] [address] [place], [place] [address]    Report Status 07/30/2021 FINAL  Final  Culture, Respiratory w Gram Stain     Status: None (Preliminary result)   Collection Time: 07/30/21  7:57 AM   Specimen: SPU  Result Value Ref Range Status   Specimen Description   Final    SPUTUM Performed at [facility] [address] [place], [place] [address]    Special Requests   Final    NONE Reflexed from [phone] Performed at [facility] [address], [place], [place] [address]    Gram Stain   Final    RARE SQUAMOUS EPITHELIAL CELLS PRESENT RARE WBC PRESENT, PREDOMINANTLY PMN RARE GRAM NEGATIVE RODS    Culture    Final    CULTURE REINCUBATED FOR BETTER GROWTH Performed at [facility], [place] [address] [place], [place] [address]    Report Status PENDING  Incomplete          Radiology Studies: DG Chest 2 View  Result Date: 07/30/2021 CLINICAL DATA:  Stage IV lung cancer, nausea and vomiting EXAM: CHEST - 2 VIEW  COMPARISON:  07/28/2021 chest radiograph. FINDINGS: Stable configuration of right internal jugular Port-A-Cath and left chest loop recorder. Stable cardiomediastinal silhouette with top-normal heart size. No pneumothorax. Chronic mild blunting of the left costophrenic angle. No right pleural effusion. Volume loss and patchy consolidation in the mid to lower left lung, unchanged. No overt pulmonary edema. New faint patchy opacity in the right mid lung. IMPRESSION: 1. New faint patchy opacity in the right mid lung, differential includes mild atelectasis, aspiration or pneumonia. 2. Stable volume loss and patchy consolidation in the mid to lower left lung compatible with post treatment change. Electronically Signed   By: Ilona Sorrel M.D.   On: 07/30/2021 13:34   DG Abd 2 Views  Result Date: 07/30/2021 CLINICAL DATA:  Nausea, vomiting, stage IV lung cancer EXAM: ABDOMEN - 2 VIEW COMPARISON:  03/24/2021 PET-CT FINDINGS: Loop recorder overlies the left heart. Bilateral posterior spinal fusion hardware at L5-S1. Fiducial markers overlie the region of the prostate. Mild gaseous distention of the stomach. No disproportionately dilated small bowel loops or significant air-fluid levels. Mild-to-moderate colonic gas in stool. No evidence of pneumatosis or pneumoperitoneum. No radiopaque nephrolithiasis. Marked lumbar spondylosis. Nonspecific patchy left lung base opacity. IMPRESSION: Nonobstructive bowel gas pattern. Mild gaseous distention of the stomach. Electronically Signed   By: Ilona Sorrel M.D.   On: 07/30/2021 13:31   VAS [place] LOWER EXTREMITY VENOUS (DVT)  Result Date: 07/30/2021  Lower Venous  DVT Study Patient Name:  MERVIL WACKER  Date of Exam:   07/30/2021 Medical Rec #: [id number]    Accession #:    [id number] Date of Birth: Dec 28, 1945   Patient Gender: M Patient Age:   87 years Exam Location:  [facility] Procedure:      VAS [place] LOWER EXTREMITY VENOUS (DVT) Referring Phys: Quillian Quince Taryn Shellhammer --------------------------------------------------------------------------------  Indications: LLE cellulitis.  Risk Factors: CA patient on chemotherapy. Limitations: Poor ultrasound/tissue interface. Comparison Study: No previous exams Performing Technologist: Jody Hill RVT, RDMS  Examination Guidelines: A complete evaluation includes B-mode imaging, spectral Doppler, color Doppler, and power Doppler as needed of all accessible portions of each vessel. Bilateral testing is considered an integral part of a complete examination. Limited examinations for reoccurring indications may be performed as noted. The reflux portion of the exam is performed with the patient in reverse Trendelenburg.  +---------+---------------+---------+-----------+----------+--------------+ RIGHT    CompressibilityPhasicitySpontaneityPropertiesThrombus Aging +---------+---------------+---------+-----------+----------+--------------+ CFV      Full           Yes      Yes                                 +---------+---------------+---------+-----------+----------+--------------+ SFJ      Full                                                        +---------+---------------+---------+-----------+----------+--------------+ FV Prox  Full           Yes      Yes                                 +---------+---------------+---------+-----------+----------+--------------+ FV Mid   Full           Yes  Yes                                 +---------+---------------+---------+-----------+----------+--------------+ FV DistalFull           Yes      Yes                                  +---------+---------------+---------+-----------+----------+--------------+ PFV      Full                                                        +---------+---------------+---------+-----------+----------+--------------+ POP      Full           Yes      Yes                                 +---------+---------------+---------+-----------+----------+--------------+ PTV      Full                                                        +---------+---------------+---------+-----------+----------+--------------+ PERO     Full                                                        +---------+---------------+---------+-----------+----------+--------------+   +---------+---------------+---------+-----------+----------+--------------+ LEFT     CompressibilityPhasicitySpontaneityPropertiesThrombus Aging +---------+---------------+---------+-----------+----------+--------------+ CFV      Full           Yes      Yes                                 +---------+---------------+---------+-----------+----------+--------------+ SFJ      Full                                                        +---------+---------------+---------+-----------+----------+--------------+ FV Prox  Full           Yes      Yes                                 +---------+---------------+---------+-----------+----------+--------------+ FV Mid   Full           Yes      Yes                                 +---------+---------------+---------+-----------+----------+--------------+ FV DistalFull           Yes      Yes                                 +---------+---------------+---------+-----------+----------+--------------+  PFV      Full                                                        +---------+---------------+---------+-----------+----------+--------------+ POP      Full           Yes      Yes                                  +---------+---------------+---------+-----------+----------+--------------+ PTV      Full                                                        +---------+---------------+---------+-----------+----------+--------------+ PERO     Full                                                        +---------+---------------+---------+-----------+----------+--------------+     Summary: BILATERAL: - No evidence of deep vein thrombosis seen in the lower extremities, bilaterally. - No evidence of superficial venous thrombosis in the lower extremities, bilaterally. -No evidence of popliteal cyst, bilaterally.   *See table(s) above for measurements and observations. Electronically signed by Orlie Pollen on 07/30/2021 at 6:44:08 PM.    Final         Scheduled Meds:  acidophilus   Oral Daily   arformoterol  15 mcg Nebulization BID   And   umeclidinium bromide  1 puff Inhalation Daily   atorvastatin  40 mg Oral Daily   carbidopa-levodopa  1 tablet Oral 3 times per day   chlorhexidine  15 mL Mouth Rinse BID   Chlorhexidine Gluconate Cloth  6 each Topical Daily   dofetilide  500 mcg Oral BID   enoxaparin (LOVENOX) injection  100 mg Subcutaneous Q12H   fenofibrate  160 mg Oral Daily   fluticasone  2 spray Each Nare Daily   folic acid  1 mg Oral Daily   guaiFENesin  1,200 mg Oral BID   iohexol  500 mL Oral Q1H   iohexol       mouth rinse  15 mL Mouth Rinse q12n4p   metoprolol tartrate  5 mg Intravenous Q8H   multivitamin with minerals  1 tablet Oral Daily   pantoprazole  40 mg Oral Daily   tamsulosin  0.4 mg Oral QPC supper   topiramate  100 mg Oral BID   Continuous Infusions:  0.9 % NaCl with KCl 40 mEq / L 100 mL/hr at 07/30/21 2144   azithromycin 500 mg (07/31/21 0859)   cefTRIAXone (ROCEPHIN)  IV 2 g (07/31/21 1112)   vancomycin 1,000 mg (07/31/21 0010)     LOS: 2 days    Time spent: 40 minutes    Irine Seal, MD [facility]   To contact the attending provider  between 7A-7P or the covering provider during after hours 7P-7A, please log into the web site [email or web address] and access using universal [place] password for that  web site. If you do not have the password, please call the hospital operator.  07/31/2021, 11:51 AM

## 2021-07-31 NOTE — Evaluation (Signed)
SLP Cancellation Note  Patient Details Name: JUSTN QUALE MRN: [id number] DOB: 13-Mar-1946   Cancelled treatment:       Reason Eval/Treat Not Completed: Other (comment) (pt npo at 1230 due to CT with contrast needed, will see 10/29. Thanks.)  Kathleen Lime, MS [facility] S[facility] Acute Rehab Services Office [phone] Pager [phone]   Macario Golds 07/31/2021, 5:06 PM

## 2021-07-31 NOTE — Evaluation (Signed)
Physical Therapy Evaluation Patient Details Name: Corey [name] MRN: [id number] DOB: 1945/10/24 Today's Date: 07/31/2021  History of Present Illness  75yo male who presented on 10/25 with L LE edema and generalized weakness. Also with recent hx of PNA. Admitted with L LE cellulitis. PMH NSCLC on chemo, Afib, CAD, chronic LBP with radiculopathy, HLD, HTN, prostate CA, CVA, ACDF, back surgery, L rotator cuff repair, TKA  Clinical Impression   Patient received in bed, pleasant and cooperative, but very emotional regarding his recent decline in functional status related to chemo- became tearful while telling me his concerns about losing his mobility and being worried about not being able to get out of the house to be with his family. Provided supportive and reflective listening and educated on PT role in managing fatigue/helping to manage QOL/mobility while going through and after receiving chemo. Able to stand and take some side steps alongside EOB with RW/one person assist today but very fatigued (RN confirms he did have a very busy day). Left in bed positioned to comfort with all needs met, bed alarm active. Tells me he would really like to avoid SNF, interested in seeing if CIR could be an option. Will continue to follow while he remains in house.      Recommendations for follow up therapy are one component of a multi-disciplinary discharge planning process, led by the attending physician.  Recommendations may be updated based on patient status, additional functional criteria and insurance authorization.  Follow Up Recommendations Acute inpatient rehab (3hours/day)    Assistance Recommended at Discharge Intermittent Supervision/Assistance  Functional Status Assessment Patient has had a recent decline in their functional status and demonstrates the ability to make significant improvements in function in a reasonable and predictable amount of time.  Equipment Recommendations  Other (comment) (seems  well equipped)    Recommendations for Other Services       Precautions / Restrictions Precautions Precautions: Fall Precaution Comments: on active chemo Restrictions Weight Bearing Restrictions: No      Mobility  Bed Mobility Overal bed mobility: Needs Assistance Bed Mobility: Supine to Sit;Sit to Supine     Supine to sit: Max assist Sit to supine: Mod assist;HOB elevated   General bed mobility comments: needed ModA for legs and MaxA to elevate trunk without HOB elevated, but steady once sitting at midline at EOB; mostly needed ModA for BLE management for return to bed with HOB elevated    Transfers Overall transfer level: Needs assistance Equipment used: Rolling walker (2 wheels) Transfers: Sit to/from Stand Sit to Stand: Min assist;From elevated surface           General transfer comment: cues for hand placement and sequencing    Ambulation/Gait             General Gait Details: deferred- fatigue; able to take side steps with RW and MinA for balance/sequencing  Stairs            Wheelchair Mobility    Modified Rankin (Stroke Patients Only)       Balance Overall balance assessment: Needs assistance   Sitting balance-Leahy Scale: Fair Sitting balance - Comments: Tremulous   Standing balance support: Bilateral upper extremity supported Standing balance-Leahy Scale: Poor Standing balance comment: Requires BUE support on RW                             Pertinent Vitals/Pain Pain Assessment: No/denies pain Pain Score: 0-No pain Pain Intervention(s): Limited  activity within patient's tolerance;Monitored during session    [place] expects to be discharged to:: Skilled nursing facility Living Arrangements: Spouse/significant other Available Help at Discharge: Family;Available PRN/intermittently;Friend(s) (spouse works outside the home) Type of Home: House Home Access: Stairs to enter Entrance Stairs-Rails:  None Technical brewer of Steps: 2 Alternate Level Stairs-Number of Steps: flight Home Layout: Two level;Bed/bath upstairs;1/2 bath on main level Home Equipment: Rollator (4 wheels);Cane - single point;Shower Land (2 wheels);Hand held shower head;Transport chair;Adaptive equipment Additional Comments: Pt no longer with Life Alert system    Prior Function Prior Level of Function : Needs assist       Physical Assist : ADLs (physical)   ADLs (physical): IADLs Mobility Comments: Pt reports that he was using a cane when first home from [place], but able to get OOB and transfer without assistance until recently. Pt reports active HH PT with [facility] ADLs Comments: Pt reports that he was performing his basic ADLs on his own with assistance with IADLs.     Hand Dominance   Dominant Hand: Right    Extremity/Trunk Assessment   Upper Extremity Assessment Upper Extremity Assessment: Defer to OT evaluation    Lower Extremity Assessment Lower Extremity Assessment: Generalized weakness    Cervical / Trunk Assessment Cervical / Trunk Assessment: Normal  Communication   Communication: No difficulties  Cognition Arousal/Alertness: Awake/alert Behavior During Therapy: WFL for tasks assessed/performed Overall Cognitive Status: Within Functional Limits for tasks assessed                                 General Comments: very emotional and tearful at times discussing recent decrease in function; tells me he is concerned that he'll get to the point of not being able to go outside of the home with his family (vacations, etc) and he would not want to live like that/be housebound long term        General Comments      Exercises     Assessment/Plan    PT Assessment Patient needs continued PT services  PT Problem List Decreased strength;Decreased knowledge of use of DME;Decreased activity tolerance;Decreased safety awareness;Decreased balance;Decreased  mobility       PT Treatment Interventions DME instruction;Balance training;Gait training;Stair training;Functional mobility training;Patient/family education;Therapeutic activities;Therapeutic exercise    PT Goals (Current goals can be found in the Care Plan section)  Acute Rehab PT Goals Patient Stated Goal: avoid SNF/maybe try an inpatient rehab and be able to move better PT Goal Formulation: With patient Time For Goal Achievement: 08/14/21 Potential to Achieve Goals: Fair    Frequency Min 4X/week   Barriers to discharge        Co-evaluation               AM-PAC PT "6 Clicks" Mobility  Outcome Measure Help needed turning from your back to your side while in a flat bed without using bedrails?: A Little Help needed moving from lying on your back to sitting on the side of a flat bed without using bedrails?: A Lot Help needed moving to and from a bed to a chair (including a wheelchair)?: A Lot Help needed standing up from a chair using your arms (e.g., wheelchair or bedside chair)?: A Lot Help needed to walk in hospital room?: A Lot Help needed climbing 3-5 steps with a railing? : Total 6 Click Score: 12    End of Session Equipment Utilized During Treatment: Gait  belt Activity Tolerance: Patient tolerated treatment well Patient left: in bed;with call bell/phone within reach;with bed alarm set Nurse Communication: Mobility status PT Visit Diagnosis: Unsteadiness on feet (R26.81);Muscle weakness (generalized) (M62.81);Difficulty in walking, not elsewhere classified (R26.2)    Time: 4801-6553 PT Time Calculation (min) (ACUTE ONLY): 25 min   Charges:   PT Evaluation $PT Eval Moderate Complexity: 1 Mod PT Treatments $Therapeutic Activity: 8-22 mins       Windell Norfolk, DPT, PN2   Supplemental Physical Therapist [place]    Pager [phone] Acute Rehab Office [phone]

## 2021-07-31 NOTE — Progress Notes (Signed)
   Inpatient Rehab Admissions Coordinator :  Per therapy recommendations, patient was screened for CIR candidacy by Danne Baxter RN MSN. Noted patient preference to avoid SNF. At this time patient appears to be a potential candidate for CIR. I will place a rehab consult per protocol for full assessment. Please call me with any questions.  Danne Baxter RN MSN Admissions Coordinator [phone]

## 2021-07-31 NOTE — Evaluation (Signed)
Occupational Therapy Evaluation Patient Details Name: Corey [name] MRN: [id number] DOB: 02/02/1946 Today's Date: 07/31/2021   History of Present Illness Patient pleasant 75 year old gentleman history of recent PNA, non-small cell lung cancer on chemotherapy, OSA, A. fib, coronary artery disease presented with left lower extremity swelling and generalized weakness. Pt noted with a left lower extremity cellulitis.  Patient noted the night of admission to have intractable nausea and vomiting.   Clinical Impression   Patient is currently requiring assistance with ADLs including moderate assist with toileting, minimal assist with LE dressing with use of adaptive equipment which pt has at home, minimal to moderate assist with bathing, and setup assist with seated UE dressing and grooming, all of which is below patient's typical baseline of being Modified independent with BADLs and functional mobility in home.  During this evaluation, patient was limited by generalized weakness, impaired activity tolerance, and body tremor once fatigued, which has the potential to impact patient's safety and independence during functional mobility, as well as performance for ADLs.  Patient lives with his spouse, who, along with friends is unable to provide 24/7 supervision and assistance, and pt reports that his wife cannot care for him in this state.  Patient demonstrates good rehab potential, and should benefit from continued skilled occupational therapy services while in acute care to maximize safety, independence and quality of life at home.  Continued occupational therapy services in a SNF setting prior to return home is recommended.  ?     Recommendations for follow up therapy are one component of a multi-disciplinary discharge planning process, led by the attending physician.  Recommendations may be updated based on patient status, additional functional criteria and insurance authorization.   Follow Up  Recommendations  Skilled nursing-short term rehab (<3 hours/day) (Pt requesting [facility] if available.)    Assistance Recommended at Discharge Frequent or constant Supervision/Assistance  Functional Status Assessment  Patient has had a recent decline in their functional status and demonstrates the ability to make significant improvements in function in a reasonable and predictable amount of time.  Equipment Recommendations  BSC    Recommendations for Other Services PT consult     Precautions / Restrictions Precautions Precautions: Fall Restrictions Weight Bearing Restrictions: No      Mobility Bed Mobility Overal bed mobility: Needs Assistance Bed Mobility: Supine to Sit     Supine to sit: Mod assist;HOB elevated     General bed mobility comments: Use of rails, increased time/effort    Transfers Overall transfer level: Needs assistance   Transfers: Sit to/from Stand;Stand Pivot Transfers Sit to Stand: Min assist;From elevated surface Stand pivot transfers: Min assist         General transfer comment: RW for pivot      Balance Overall balance assessment: Needs assistance   Sitting balance-Leahy Scale: Fair Sitting balance - Comments: Tremulous   Standing balance support: Bilateral upper extremity supported Standing balance-Leahy Scale: Poor Standing balance comment: Requires BUE support on RW                           ADL either performed or assessed with clinical judgement   ADL Overall ADL's : Needs assistance/impaired Eating/Feeding: Modified independent;Sitting Eating/Feeding Details (indicate cue type and reason): Increased effort due to BUE tremor, but no assist needed Grooming: Wash/dry hands;Sitting;Set up   Upper Body Bathing: Set up;Sitting   Lower Body Bathing: Minimal assistance;Sitting/lateral leans   Upper Body Dressing : Set up;Sitting  Lower Body Dressing: With adaptive equipment;Minimal assistance;Sit to/from  stand;Sitting/lateral leans   Toilet Transfer: BSC;Rolling walker (2 wheels);Stand-pivot;Minimal assistance Toilet Transfer Details (indicate cue type and reason): Cues for hand placement. Increased effort. Toileting- Clothing Manipulation and Hygiene: Moderate assistance;Sit to/from stand;Sitting/lateral lean Toileting - Clothing Manipulation Details (indicate cue type and reason): Pt able to perform posterior peri care using orward lean on BSC. Required Mod Assist to completed thorough hygiene while standing and holding RW.     Functional mobility during ADLs: Minimal assistance;Rolling walker (2 wheels)       Vision Baseline Vision/History: 1 Wears glasses Ability to See in Adequate Light: 0 Adequate Vision Assessment?: No apparent visual deficits     Perception     Praxis      Pertinent Vitals/Pain Pain Assessment: 0-10 Pain Score: 0-No pain Pain Location: RT "sternum" earlier this morning up to an 8/10. Now a zero. Pain Intervention(s): Limited activity within patient's tolerance;Monitored during session     Hand Dominance Right   Extremity/Trunk Assessment Upper Extremity Assessment Upper Extremity Assessment: Generalized weakness (BUE tremor with fatigue/effort)   Lower Extremity Assessment Lower Extremity Assessment: Defer to PT evaluation   Cervical / Trunk Assessment Cervical / Trunk Assessment: Normal   Communication Communication Communication: No difficulties   Cognition Arousal/Alertness: Awake/alert Behavior During Therapy: WFL for tasks assessed/performed Overall Cognitive Status: Within Functional Limits for tasks assessed                                 General Comments: Tearful at times discussing recent decrease in function.     General Comments       Exercises     Shoulder Instructions      Home Living Family/patient expects to be discharged to:: Skilled nursing facility Living Arrangements: Spouse/significant  other Available Help at Discharge: Family;Available PRN/intermittently;Friend(s) (Spouse works outside the home. [employer]) Type of Home: House Home Access: Stairs to enter Technical brewer of Steps: 2 Entrance Stairs-Rails: None Home Layout: Two level;Bed/bath upstairs;1/2 bath on main level Alternate Level Stairs-Number of Steps: flight Alternate Level Stairs-Rails: Right;Left (Bil rails on lower half of stairs then after landing, then RT only ascending for 2nd half of stairs) Bathroom Shower/Tub: Teacher, early years/pre: Handicapped height Bathroom Accessibility: No   Home Equipment: Rollator (4 wheels);Cane - single point;Shower Land (2 wheels);Hand held shower head;Transport chair;Adaptive equipment Adaptive Equipment: Reacher;Sock aid;Long-handled shoe horn;Long-handled sponge Additional Comments: Pt no longer with Life Alert system      Prior Functioning/Environment Prior Level of Function : Needs assist       Physical Assist : ADLs (physical)   ADLs (physical): IADLs Mobility Comments: Pt reports that he was using a cane when first home from [place], but able to get OOB and transfer without assistance until recently. Pt reports active HH PT with [facility] ADLs Comments: Pt reports that he was performing his basic ADLs on his own with assistance with IADLs.        OT Problem List: Decreased activity tolerance;Impaired balance (sitting and/or standing);Decreased strength;Cardiopulmonary status limiting activity;Increased edema;Decreased knowledge of use of DME or AE      OT Treatment/Interventions: Self-care/ADL training;Therapeutic exercise;Therapeutic activities;Energy conservation;Patient/family education;DME and/or AE instruction;Balance training    OT Goals(Current goals can be found in the care plan section) Acute Rehab OT Goals Patient Stated Goal: Regain energy and independence OT Goal Formulation: With patient Time For Goal  Achievement: 08/14/21 Potential  to Achieve Goals: Good ADL Goals Pt Will Perform Grooming: with modified independence;standing (with RPE <5) Pt Will Perform Lower Body Dressing: with set-up;with adaptive equipment;sitting/lateral leans;sit to/from stand Pt Will Transfer to Toilet: with modified independence;ambulating Pt Will Perform Toileting - Clothing Manipulation and hygiene: with modified independence;with adaptive equipment (AE if needed, vs education on bidet/toilet tongs) Additional ADL Goal #1: Patient will tolerate BUE home exercise program 10-15 reps within pain-free ranges, in an unsupported seated position, in order to improve upper body strength, endurance and core stability needed to complete home ADLs. Additional ADL Goal #2: Patient will identify at least 3 energy conservation strategies to employ at home in order to maximize function and quality of life and decrease caregiver burden while preventing exacerbation of symptoms and rehospitalization.  OT Frequency: Min 2X/week   Barriers to D/C: Decreased caregiver support          Co-evaluation              AM-PAC OT "6 Clicks" Daily Activity     Outcome Measure Help from another person eating meals?: None Help from another person taking care of personal grooming?: A Little Help from another person toileting, which includes using toliet, bedpan, or urinal?: A Lot Help from another person bathing (including washing, rinsing, drying)?: A Lot Help from another person to put on and taking off regular upper body clothing?: A Little Help from another person to put on and taking off regular lower body clothing?: A Little (with AE) 6 Click Score: 17   End of Session Equipment Utilized During Treatment: Gait belt;Rolling walker (2 wheels) Nurse Communication: Mobility status (RN in room and assisted with BSC t/f)  Activity Tolerance: Patient limited by fatigue Patient left: in chair;with call bell/phone within reach;with  chair alarm set  OT Visit Diagnosis: Unsteadiness on feet (R26.81)                Time: 2202-5427 OT Time Calculation (min): 40 min Charges:  OT General Charges $OT Visit: 1 Visit OT Evaluation $OT Eval Low Complexity: 1 Low OT Treatments $Self Care/Home Management : 8-22 mins $Therapeutic Activity: 8-22 mins  Anderson Malta, OT Acute Rehab Services Office: [phone] 07/31/2021  Julien Girt 07/31/2021, 10:32 AM

## 2021-07-31 NOTE — Plan of Care (Signed)
  Problem: Education: Goal: Knowledge of General Education information will improve Description: Including pain rating scale, medication(s)/side effects and non-pharmacologic comfort measures Outcome: Progressing   Problem: Clinical Measurements: Goal: Diagnostic test results will improve Outcome: Progressing   

## 2021-07-31 NOTE — Progress Notes (Signed)
Bled in 3L to pts home cpap. He placed himself on cpap with me at bedside.

## 2021-08-01 ENCOUNTER — Inpatient Hospital Stay (HOSPITAL_COMMUNITY): Payer: Medicare PPO

## 2021-08-01 DIAGNOSIS — I10 Essential (primary) hypertension: Secondary | ICD-10-CM | POA: Diagnosis not present

## 2021-08-01 DIAGNOSIS — J9601 Acute respiratory failure with hypoxia: Secondary | ICD-10-CM | POA: Diagnosis not present

## 2021-08-01 DIAGNOSIS — J189 Pneumonia, unspecified organism: Secondary | ICD-10-CM | POA: Diagnosis not present

## 2021-08-01 DIAGNOSIS — L03116 Cellulitis of left lower limb: Secondary | ICD-10-CM | POA: Diagnosis not present

## 2021-08-01 DIAGNOSIS — I480 Paroxysmal atrial fibrillation: Secondary | ICD-10-CM | POA: Diagnosis not present

## 2021-08-01 LAB — CBC WITH DIFFERENTIAL/PLATELET
Abs Immature Granulocytes: 0.06 10*3/uL (ref 0.00–0.07)
Basophils Absolute: 0 10*3/uL (ref 0.0–0.1)
Basophils Relative: 0 %
Eosinophils Absolute: 0.1 10*3/uL (ref 0.0–0.5)
Eosinophils Relative: 1 %
HCT: 22.2 % — ABNORMAL LOW (ref 39.0–52.0)
Hemoglobin: 7.3 g/dL — ABNORMAL LOW (ref 13.0–17.0)
Immature Granulocytes: 1 %
Lymphocytes Relative: 3 %
Lymphs Abs: 0.2 10*3/uL — ABNORMAL LOW (ref 0.7–4.0)
MCH: 31.7 pg (ref 26.0–34.0)
MCHC: 32.9 g/dL (ref 30.0–36.0)
MCV: 96.5 fL (ref 80.0–100.0)
Monocytes Absolute: 0.7 10*3/uL (ref 0.1–1.0)
Monocytes Relative: 10 %
Neutro Abs: 5.4 10*3/uL (ref 1.7–7.7)
Neutrophils Relative %: 85 %
Platelets: 140 10*3/uL — ABNORMAL LOW (ref 150–400)
RBC: 2.3 MIL/uL — ABNORMAL LOW (ref 4.22–5.81)
RDW: 19.5 % — ABNORMAL HIGH (ref 11.5–15.5)
WBC: 6.4 10*3/uL (ref 4.0–10.5)
nRBC: 0 % (ref 0.0–0.2)

## 2021-08-01 LAB — BLOOD GAS, ARTERIAL
Acid-base deficit: 3.7 mmol/L — ABNORMAL HIGH (ref 0.0–2.0)
Bicarbonate: 19.9 mmol/L — ABNORMAL LOW (ref 20.0–28.0)
Drawn by: 331471
O2 Saturation: 99 %
Patient temperature: 98.6
pCO2 arterial: 31.9 mmHg — ABNORMAL LOW (ref 32.0–48.0)
pH, Arterial: 7.411 (ref 7.350–7.450)
pO2, Arterial: 114 mmHg — ABNORMAL HIGH (ref 83.0–108.0)

## 2021-08-01 LAB — BASIC METABOLIC PANEL
Anion gap: 5 (ref 5–15)
BUN: 9 mg/dL (ref 8–23)
CO2: 21 mmol/L — ABNORMAL LOW (ref 22–32)
Calcium: 8 mg/dL — ABNORMAL LOW (ref 8.9–10.3)
Chloride: 104 mmol/L (ref 98–111)
Creatinine, Ser: 0.64 mg/dL (ref 0.61–1.24)
GFR, Estimated: >60 mL/min (ref >60)
Glucose, Bld: 91 mg/dL (ref 70–99)
Potassium: 3.9 mmol/L (ref 3.5–5.1)
Sodium: 130 mmol/L — ABNORMAL LOW (ref 135–145)

## 2021-08-01 LAB — CULTURE, RESPIRATORY W GRAM STAIN

## 2021-08-01 LAB — BRAIN NATRIURETIC PEPTIDE: B Natriuretic Peptide: 94.3 pg/mL (ref 0.0–100.0)

## 2021-08-01 LAB — MAGNESIUM: Magnesium: 1.7 mg/dL (ref 1.7–2.4)

## 2021-08-01 MED ORDER — FOLIC ACID 1 MG PO TABS: 1.0000 mg | ORAL_TABLET | Freq: Every day | ORAL | Status: DC

## 2021-08-01 MED ORDER — FUROSEMIDE 10 MG/ML IJ SOLN
20.0000 mg | Freq: Once | INTRAMUSCULAR | Status: AC
  Administered 2021-08-01: 20 mg via INTRAVENOUS
  Filled 2021-08-01: qty 2

## 2021-08-01 MED ORDER — LOPERAMIDE HCL 2 MG PO CAPS: 2.0000 mg | ORAL_CAPSULE | Freq: Every day | ORAL | Status: DC | PRN

## 2021-08-01 MED ORDER — IPRATROPIUM BROMIDE 0.03 % NA SOLN
1.0000 | Freq: Two times a day (BID) | NASAL | Status: DC
  Administered 2021-08-01 – 2021-08-03 (×4): 1 via NASAL
  Administered 2021-08-04: 2 via NASAL
  Administered 2021-08-04 – 2021-08-06 (×4): 1 via NASAL
  Administered 2021-08-06: 2 via NASAL
  Administered 2021-08-07: 1 via NASAL
  Administered 2021-08-07: 2 via NASAL
  Administered 2021-08-08: 1 via NASAL
  Administered 2021-08-08 – 2021-08-09 (×3): 2 via NASAL
  Administered 2021-08-10 – 2021-08-11 (×4): 1 via NASAL
  Administered 2021-08-12: 2 via NASAL
  Filled 2021-08-01: qty 30

## 2021-08-01 MED ORDER — RIVAROXABAN 20 MG PO TABS
20.0000 mg | ORAL_TABLET | Freq: Every day | ORAL | Status: DC
  Administered 2021-08-01 – 2021-08-11 (×11): 20 mg via ORAL
  Filled 2021-08-01 (×11): qty 1

## 2021-08-01 MED ORDER — MAGNESIUM OXIDE -MG SUPPLEMENT 400 (240 MG) MG PO TABS
400.0000 mg | ORAL_TABLET | Freq: Every day | ORAL | Status: DC
  Administered 2021-08-01 – 2021-08-12 (×12): 400 mg via ORAL
  Filled 2021-08-01 (×12): qty 1

## 2021-08-01 MED ORDER — POTASSIUM CHLORIDE CRYS ER 10 MEQ PO TBCR
10.0000 meq | EXTENDED_RELEASE_TABLET | Freq: Every day | ORAL | Status: DC
  Administered 2021-08-01: 10 meq via ORAL
  Filled 2021-08-01: qty 1

## 2021-08-01 MED ORDER — CARBINOXAMINE MALEATE 6 MG PO TABS: 1.0000 | ORAL_TABLET | Freq: Two times a day (BID) | ORAL | Status: DC | PRN

## 2021-08-01 MED ORDER — LORATADINE 10 MG PO TABS
10.0000 mg | ORAL_TABLET | Freq: Every day | ORAL | Status: DC | PRN
  Administered 2021-08-01: 10 mg via ORAL
  Filled 2021-08-01: qty 1

## 2021-08-01 MED ORDER — FUROSEMIDE 10 MG/ML IJ SOLN
40.0000 mg | Freq: Two times a day (BID) | INTRAMUSCULAR | Status: AC
  Administered 2021-08-01 – 2021-08-03 (×4): 40 mg via INTRAVENOUS
  Filled 2021-08-01 (×4): qty 4

## 2021-08-01 MED ORDER — METOPROLOL SUCCINATE ER 25 MG PO TB24
25.0000 mg | ORAL_TABLET | Freq: Every day | ORAL | Status: DC
  Administered 2021-08-01 – 2021-08-12 (×12): 25 mg via ORAL
  Filled 2021-08-01 (×12): qty 1

## 2021-08-01 MED ORDER — MAGNESIUM SULFATE 2 GM/50ML IV SOLN
2.0000 g | Freq: Once | INTRAVENOUS | Status: AC
  Administered 2021-08-01: 2 g via INTRAVENOUS
  Filled 2021-08-01: qty 50

## 2021-08-01 NOTE — Progress Notes (Signed)
Modified Barium Swallow Progress Note  Patient Details  Name: Corey [name] MRN: [id number] Date of Birth: 1945-10-27  Today's Date: 08/01/2021  Modified Barium Swallow completed.  Full report located under Chart Review in the Imaging Section.  Brief recommendations include the following:  Clinical Impression     Pt was tested with thin, ultrathin (50% water with thin barium mixture), nectar, pudding and cracker.  Pt presents with minimal oral dysphagia characterized by lingual pumping with liquid allowing a few episodes of premature spillage of liquid into pharynx to vallecular space or pyriform sinus prior to swallow trigger.  Pharyngeal swallow is strong with adequate motility without retention.  Decreased timing of laryngeal closure inconsistently resulted in very trace laryngeal penetration of thin but no aspiration.  Due to pt's cervical hardware- chin tuck posture not tested.   Upon esophageal sweep, esophagus was clear. After presenting thin barium swallow - pt appeared with retrograde propulsion of liquid to thoracic esophagus causing SLP to question if this could be consistent with dysmotility and contributing to aspiration risk.   Defer to referring MD for indication of esophagram during this hospital coarse.    Pt also did not cough during MBS and he was challenged to consume sequential boluses.  Recommend for energy conservation given dyspnea to continue dys3/thin diet.  Will follow up briefly for family education. Pt, spouse and son were educated during [facility].  Swallow Evaluation Recommendations       SLP Diet Recommendations: Dysphagia 3 (Mech soft) solids;Thin liquid   Liquid Administration via: Cup;Straw   Medication Administration: Other (Comment) (as tolerated) one at a time preferred with liquids or with puree   Supervision: Patient able to self feed   Compensations: Slow rate;Small sips/bites   Postural Changes: Remain semi-upright after after feeds/meals  (Comment);Seated upright at 90 degrees start intake with liquids, rest if dypneic   Oral Care Recommendations: Oral care BID      Kathleen Lime, MS [facility] SLP Acute Rehab Services Office [phone] Pager [phone]   Macario Golds 08/01/2021,5:46 PM

## 2021-08-01 NOTE — Progress Notes (Signed)
PROGRESS NOTE    Corey [name]  OIZ:[id number] DOB: July 26, 1946 DOA: 07/28/2021 PCP: [name], MD   Chief Complaint  Patient presents with   Fatigue   Shortness of Breath    Brief Narrative:  Patient pleasant 75 year old gentleman history of non-small cell lung cancer on chemotherapy, OSA, A. fib, coronary artery disease presented with left lower extremity swelling and generalized weakness.  Patient noted to have recently been treated with Levaquin for pneumonia which she completed the day prior to his erythema.  Patient presented with shortness of breath from pneumonia.  Chest x-ray done showed unchanged left lower lobe opacity, no fevers recorded, patient on also noted with a left lower extremity cellulitis.  Patient placed on IV vancomycin cefepime and admitted for further evaluation and management.  Patient noted the night of admission 12 intractable nausea and vomiting and abdominal films and chest x-ray ordered.   Assessment & Plan:   Principal Problem:   Cellulitis of left leg Active Problems:   Benign essential HTN   Dyslipidemia   AF (paroxysmal atrial fibrillation) (HCC)   OSA on CPAP   Hyponatremia   Essential hypertension   History of stroke   Primary cancer of left lower lobe of lung (HCC)   CAP (community acquired pneumonia)   Malaise   Hypokalemia   Hypomagnesemia   Nausea & vomiting   Cellulitis of left lower extremity   Acute respiratory failure with hypoxia ([facility])  #1 left lower extremity cellulitis -Patient with slight improvement left lower extremity cellulitis. -Currently on IV vancomycin and IV Rocephin. -Discontinued IV vancomycin.  -Continue IV Rocephin.  -Blood cultures with no growth to date.  -Supportive care.  2.  Acute respiratory failure with hypoxia -Patient noted to have worsening respiratory status with increased O2 requirements overnight. -It is noted that patient had a sats of 77% on room air currently on 4 L nasal cannula with  sats of 94%. -Check a BNP, chest x-ray.  ABG. -Lasix 40 mg IV every 12 hours. -Xopenex and Atrovent nebs. -Supportive care.  3.  Nausea vomiting -Patient noted to have intractable nausea vomiting the evening of 07/29/2021, morning of 07/30/2021 . -Patient placed on IV Compazine and IV Zofran with clinical improvement.  -Patient stated had a bowel movement.   -Improving clinically with no further nausea or vomiting and tolerating clear liquids. -Abdominal films with nonobstructive bowel gas pattern, mild gaseous distention of the stomach.   -Chest x-ray with new faint patchy opacity right midlung, differential includes mild atelectasis, aspiration or pneumonia.  Stable volume loss and patchy consolidation in the mid to lower left lung compatible with posttreatment change. -Patient initially placed on n.p.o. pending abdominal films, abdominal films with no obstructive pattern. -Patient placed on a full liquid diet which he tolerated, will advance to a soft diet for breakfast.   -SLP evaluation pending, patient for MBS today.   -Supportive care.  4.  Probable aspiration pneumonia /CAP -Patient noted with intractable nausea and vomiting the evening of 07/30/2019 2 in the morning of 07/30/2021.  -Chest x-ray done with new faint patchy opacity right midlung, stable volume loss and patchy consolidation in the mid to lower left lung compatible with posttreatment change. -MRSA PCR negative.   -Discontinued IV vancomycin.   -Continue IV Rocephin, IV azithromycin. -Continue Mucinex, Brovana and Incruse nebs, Flonase.  5.  Hypokalemia/hypomagnesemia/hyponatremia -Improving with hydration. -Potassium at 3.9, magnesium at 1.7.   -Magnesium sulfate 2 g IV x1.   -Patient on Tikosyn and as such  goal is to keep potassium around 4, magnesium at 2.   -Repeat labs in the morning.    6.  Stage IV non-small cell lung cancer -On palliative chemotherapy per oncology, Dr. Lorna Few. -Continue  nebs/inhalers. -Patient states was post to have some staging scans done and wondering whether they can be done whilst he is hospitalized. -Patient's oncologist ordered staging CT scans on 07/31/2021.   -Oncology informed of admission via epic.  7.  Paroxysmal atrial fibrillation -Patient with intractable nausea vomiting unable to tolerate any oral intake this morning. -Intractable nausea and vomiting improved/resolved.   -Patient transitioned from IV Lopressor to oral home dose Lopressor.  -Continue Tikosyn.   -Keep potassium approximately 4, keep magnesium approximately 2.  -Was on full dose Lovenox and transition back to home dose Xarelto.   -Supportive care.    8.  Hyperlipidemia -Continue Lipitor, fenofibrate.  9.  Coronary artery disease/hypertension -IV metoprolol has been transition back to home dose oral metoprolol.  Continue statin, anticoagulation.   10.  Parkinson's -Sinemet.  11.  Prolonged QT -Repeat EKG with resolution of QT prolongation currently at 467 from 530 on admission. -Keep potassium approximately at 4, magnesium approximately 2. -Follow.  12.  OSA -Nausea and vomiting improved no further nausea and vomiting noted.  -Resume CPAP nightly.   13.  History of CVA -Continue anticoagulation, statin.  14.  Generalized weakness -PT/OT. -PT recommending SNF however patient and family would prefer to go home with home health therapies. -Patient assessed by CIR and evaluation pending to see whether patient is an appropriate candidate. -TOC consulted for home health therapies.   DVT prophylaxis: Xarelto.  Code Status: DNR Family Communication: Updated patient, no family at bedside.   Disposition:   Status is: Inpatient  Remains inpatient appropriate because: Inpatient treatment necessary.  Patient noted to have nausea and vomiting overnight with inability to keep anything down.       Consultants:  Oncology informed of admission via epic.  Procedures:   Abdominal x-ray 07/30/2021 Chest x-ray 07/30/2021, 08/01/2021 Bilateral lower extremity Dopplers 07/30/2021 CT chest/CT abdomen and pelvis 07/31/2021  Antimicrobials:  IV azithromycin 07/30/2021>>>>>> IV cefepime 1025 2022x1 dose IV Rocephin 07/29/2021>>>>> IV vancomycin 07/28/2021>>>>>> 07/31/2021   Subjective: Sitting up on bedside commode.  Noted to have increased O2 requirements per RN noted overnight currently on 4 L nasal cannula with sats of 94%.  Denies any chest pain.  States no significant change with shortness of breath over the past 24 hours.  Tolerating current diet.   Objective: Vitals:   08/01/21 1255 08/01/21 1300 08/01/21 1340 08/01/21 1400  BP:      Pulse:      Resp:  (!) 28  (!) 22  Temp:      TempSrc:      SpO2: (!) 78% 94% 94% 92%  Weight:      Height:        Intake/Output Summary (Last 24 hours) at 08/01/2021 1515 Last data filed at 08/01/2021 1500 Gross per 24 hour  Intake 2791.58 ml  Output 2950 ml  Net -158.42 ml   Filed Weights   07/28/21 1046 07/29/21 0510  Weight: 108 kg 102.1 kg    Examination:  General exam: NAD. Respiratory system: Decreased breath sounds in the bases.  Some coarse left basilar breath sounds.  No wheezing. Cardiovascular system: Regular rate and rhythm no murmurs rubs or gallops.  No JVD.  No lower extremity edema.  Gastrointestinal system: Abdomen is soft, nontender, nondistended, positive bowel  sounds.  No rebound.  No guarding.  Central nervous system: Alert and oriented. No focal neurological deficits. Extremities: Left lower extremity with demarcated cellulitis with decreasing erythema, decreased warmth.. Skin: No rashes, lesions or ulcers Psychiatry: Judgement and insight appear normal. Mood & affect appropriate.     Data Reviewed: I have personally reviewed following labs and imaging studies  CBC: Recent Labs  Lab 07/28/21 1102 07/29/21 0648 07/30/21 0500 07/31/21 0400 08/01/21 0457  WBC 6.9 7.0  8.9 7.8 6.4  NEUTROABS  --  5.9  --  6.7 5.4  HGB 9.0* 8.1* 8.6* 7.3* 7.3*  HCT 27.4* 25.1* 26.2* 23.2* 22.2*  MCV 91.6 96.2 94.9 97.5 96.5  PLT 59* 73* 115* 128* 140*    Basic Metabolic Panel: Recent Labs  Lab 07/28/21 1109 07/29/21 0648 07/30/21 0500 07/31/21 0400 08/01/21 0457  NA 126* 129* 134* 133* 130*  K 3.7 3.4* 3.5 4.1 3.9  CL 96* 99 101 103 104  CO2 21* 21* 24 21* 21*  GLUCOSE 110* 109* 123* 90 91  BUN 16 14 14 13 9   CREATININE 0.71 0.73 0.66 0.58* 0.64  CALCIUM 9.0 8.3* 8.2* 8.1* 8.0*  MG  --  1.6* 2.0 1.8 1.7  PHOS  --   --   --  2.3*  --     GFR: Estimated Creatinine Clearance: 102.6 mL/min (by C-G formula based on SCr of 0.64 mg/dL).  Liver Function Tests: Recent Labs  Lab 07/28/21 1109 07/29/21 0648 07/30/21 0500 07/31/21 0400  AST 27 27 38  --   ALT 16 15 14   --   ALKPHOS 56 49 54  --   BILITOT 0.8 0.8 0.8  --   PROT 6.8 6.4* 6.7  --   ALBUMIN 3.0* 2.2* 2.3* 1.8*    CBG: No results for input(s): GLUCAP in the last 168 hours.   Recent Results (from the past 240 hour(s))  Blood culture (routine single)     Status: None (Preliminary result)   Collection Time: 07/28/21 11:44 AM   Specimen: BLOOD LEFT ARM  Result Value Ref Range Status   Specimen Description   Final    BLOOD LEFT ARM Performed at [facility], [address] [place], [place] [address]    Special Requests   Final    Blood Culture adequate volume Performed at [facility], [address], [place], [place] [address]    Culture   Final    NO GROWTH 4 DAYS Performed at [facility], [place] [address] [place], [place] [address]    Report Status PENDING  Incomplete  Culture, blood (routine x 2)     Status: None (Preliminary result)   Collection Time: 07/28/21  2:03 PM   Specimen: BLOOD  Result Value Ref Range Status   Specimen Description   Final    BLOOD CENTRAL LINE BLOOD RIGHT ARM Performed at [facility],  [address], [place], [place] [address]    Special Requests   Final    BOTTLES DRAWN AEROBIC AND ANAEROBIC Blood Culture adequate volume Performed at [facility], [address] [place], [place] [address]    Culture   Final    NO GROWTH 4 DAYS Performed at [facility], [place] [address] [place], [place] [address]    Report Status PENDING  Incomplete  Resp Panel by RT-PCR (Flu A&B, Covid) Nasopharyngeal Swab     Status: None   Collection Time: 07/28/21  3:35 PM  Specimen: Nasopharyngeal Swab; Nasopharyngeal(NP) swabs in vial transport medium  Result Value Ref Range Status   SARS Coronavirus 2 by RT PCR NEGATIVE NEGATIVE Final    Comment: (NOTE) SARS-CoV-2 target nucleic acids are NOT DETECTED.  The SARS-CoV-2 RNA is generally detectable in upper respiratory specimens during the acute phase of infection. The lowest concentration of SARS-CoV-2 viral copies this assay can detect is 138 copies/mL. A negative result does not preclude SARS-Cov-2 infection and should not be used as the sole basis for treatment or other patient management decisions. A negative result may occur with  improper specimen collection/handling, submission of specimen other than nasopharyngeal swab, presence of viral mutation(s) within the areas targeted by this assay, and inadequate number of viral copies(<138 copies/mL). A negative result must be combined with clinical observations, patient history, and epidemiological information. The expected result is Negative.  Fact Sheet for Patients:  EntrepreneurPulse.com.au  Fact Sheet for Healthcare Providers:  IncredibleEmployment.be  This test is no t yet approved or cleared by the [place] FDA and  has been authorized for detection and/or diagnosis of SARS-CoV-2 by FDA under an Emergency Use Authorization (EUA). This EUA will remain  in effect (meaning this test can be used) for the  duration of the COVID-19 declaration under Section 564(b)(1) of the Act, 21 U.S.C.section 360bbb-3(b)(1), unless the authorization is terminated  or revoked sooner.       Influenza A by PCR NEGATIVE NEGATIVE Final   Influenza B by PCR NEGATIVE NEGATIVE Final    Comment: (NOTE) The Xpert Xpress SARS-CoV-2/FLU/RSV plus assay is intended as an aid in the diagnosis of influenza from Nasopharyngeal swab specimens and should not be used as a sole basis for treatment. Nasal washings and aspirates are unacceptable for Xpert Xpress SARS-CoV-2/FLU/RSV testing.  Fact Sheet for Patients: EntrepreneurPulse.com.au  Fact Sheet for Healthcare Providers: IncredibleEmployment.be  This test is not yet approved or cleared by the [place] FDA and has been authorized for detection and/or diagnosis of SARS-CoV-2 by FDA under an Emergency Use Authorization (EUA). This EUA will remain in effect (meaning this test can be used) for the duration of the COVID-19 declaration under Section 564(b)(1) of the Act, 21 U.S.C. section 360bbb-3(b)(1), unless the authorization is terminated or revoked.  Performed at [facility], [address], [place], [place] [address]   Expectorated Sputum Assessment w Gram Stain, Rflx to Resp Cult     Status: None   Collection Time: 07/30/21  7:57 AM   Specimen: Sputum  Result Value Ref Range Status   Specimen Description SPUTUM  Final   Special Requests NONE  Final   Sputum evaluation   Final    THIS SPECIMEN IS ACCEPTABLE FOR SPUTUM CULTURE Performed at [facility], [place] [address], [place], [place] [address]    Report Status 07/30/2021 FINAL  Final  Culture, Respiratory w Gram Stain     Status: None   Collection Time: 07/30/21  7:57 AM   Specimen: SPU  Result Value Ref Range Status   Specimen Description   Final    SPUTUM Performed at [facility] [address], [place], [place] [address]    Special Requests   Final    NONE Reflexed from [id number] Performed at [facility] [address] [place], [place] [address]    Gram Stain   Final    RARE SQUAMOUS EPITHELIAL CELLS PRESENT RARE WBC PRESENT, PREDOMINANTLY PMN RARE GRAM NEGATIVE RODS    Culture  Final    RARE CANDIDA ALBICANS RARE VIRIDANS STREPTOCOCCUS Standardized susceptibility testing for this organism is not available. Performed at [facility], [place] [address], [place], [place] [address]    Report Status 08/01/2021 FINAL  Final  MRSA Next Gen by PCR, Nasal     Status: None   Collection Time: 07/31/21  8:12 AM   Specimen: Nasal Mucosa; Nasal Swab  Result Value Ref Range Status   MRSA by PCR Next Gen NOT DETECTED NOT DETECTED Final    Comment: (NOTE) The GeneXpert MRSA Assay (FDA approved for NASAL specimens only), is one component of a comprehensive MRSA colonization surveillance program. It is not intended to diagnose MRSA infection nor to guide or monitor treatment for MRSA infections. Test performance is not FDA approved in patients less than 13 years old. Performed at [facility], [place] [address], [place], [place] [address]           Radiology Studies: CT Chest W Contrast  Addendum Date: 07/31/2021   ADDENDUM REPORT: 07/31/2021 15:31 ADDENDUM: Dilated 2.9 cm infrarenal abdominal aorta. Recommend follow-up ultrasound every 5 years. This recommendation follows ACR consensus guidelines: White Paper of the ACR Incidental Findings Committee II on Vascular Findings. J Am Coll Radiol 2013; 10:789-794. Electronically Signed   By: Ilona Sorrel M.D.   On: 07/31/2021 15:31   Result Date: 07/31/2021 CLINICAL DATA:  Stage IV non-small cell left lung cancer diagnosed May 2022 status post palliative radiation therapy completed 03/23/2021 and chemoimmunotherapy. Patient admitted with pneumonia, dyspnea and lower extremity swelling.  Restaging. EXAM: CT CHEST, ABDOMEN, AND PELVIS WITH CONTRAST TECHNIQUE: Multidetector CT imaging of the chest, abdomen and pelvis was performed following the standard protocol during bolus administration of intravenous contrast. CONTRAST:  66mL OMNIPAQUE IOHEXOL 350 MG/ML SOLN COMPARISON:  06/02/2021 CT chest, abdomen and pelvis. FINDINGS: CT CHEST FINDINGS Cardiovascular: Top-normal heart size. Stable small pericardial effusion/thickening. Three-vessel coronary atherosclerosis. Right internal jugular Port-A-Cath terminates in the lower third of the SVC. Atherosclerotic thoracic aorta with stable dilated 4.0 cm ascending thoracic aorta. Dilated main pulmonary artery (4.6 cm diameter). No central pulmonary emboli. Mediastinum/Nodes: No discrete thyroid nodules. Mild circumferential wall thickening in the lower thoracic esophagus, not definitely changed. No pathologically enlarged axillary or mediastinal lymph nodes (previously described mildly enlarged subcarinal lymph node is favored to represent the collapsed esophagus in retrospect). Mildly enlarged 1.3 cm right hilar node (series 2/image 31), increased mildly from 1.0 cm. Lungs/Pleura: No pneumothorax. Trace dependent right pleural effusion, new. Small loculated basilar left pleural effusion, stable. Central medial left lower lobe 2.9 x 2.3 cm solid pulmonary nodule (series 2/image 34), previously 2.9 x 2.2 cm using similar measurement technique, not appreciably changed. New widespread patchy ground-glass opacity and interlobular septal thickening (crazy paving pattern) throughout both lungs, most prominent in right upper lobe. Dense patchy left perihilar and lower left lung consolidation with associated bronchiectasis, volume loss and distortion, similar to mildly increased, favor evolving postradiation change. No new significant pulmonary nodules. Musculoskeletal: No aggressive appearing focal osseous lesions. Moderate thoracic spondylosis. CT ABDOMEN PELVIS  FINDINGS Hepatobiliary: Hypodense 2.6 x 1.5 cm caudate lobe liver mass (series 2/image 59), previously 2.6 x 1.7 cm using similar measurement technique, not appreciably changed. No additional liver lesions. Normal gallbladder with no radiopaque cholelithiasis. No biliary ductal dilatation. Pancreas: Normal, with no mass or duct dilation. Spleen: Normal size. No mass. Adrenals/Urinary Tract: Normal adrenals. No hydronephrosis. Simple 1.3 cm anterior interpolar left renal cyst. Normal bladder. Stomach/Bowel: Normal non-distended  stomach. Normal caliber small bowel with no small bowel wall thickening. Normal appendix. Moderate sigmoid diverticulosis with no large bowel wall thickening or significant pericolonic fat stranding. Vascular/Lymphatic: Atherosclerotic abdominal aorta with dilated 2.9 cm infrarenal abdominal aorta. Patent portal, splenic, hepatic and renal veins. No pathologically enlarged lymph nodes in the abdomen or pelvis. Reproductive: Top-normal size prostate with internal fiducial markers. Other: No pneumoperitoneum, ascites or focal fluid collection. Musculoskeletal: No aggressive appearing focal osseous lesions. Moderate thoracic spondylosis. Bilateral posterior spinal fusion hardware at L5-S1. IMPRESSION: 1. New widespread patchy ground-glass opacity and interlobular septal thickening (crazy paving pattern) throughout both lungs, most prominent in the right upper lobe. Differential considerations include atypical/viral pneumonia, drug toxicity and hypersensitivity pneumonitis. 2. Stable central medial left lower lobe pulmonary nodule. Dense patchy left perihilar and lower left lung consolidation with associated bronchiectasis, volume loss and distortion, similar to mildly increased, favor evolving postradiation change. 3. Stable small loculated basilar left pleural effusion. New trace dependent right pleural effusion. 4. Mild right hilar adenopathy is mildly increased, nonspecific. 5. Stable caudate  lobe liver metastasis. No new or progressive metastatic disease in the abdomen or pelvis. 6. Aortic Atherosclerosis (ICD10-I70.0). Electronically Signed: By: Ilona Sorrel M.D. On: 07/31/2021 15:13   CT Abdomen Pelvis W Contrast  Addendum Date: 07/31/2021   ADDENDUM REPORT: 07/31/2021 15:31 ADDENDUM: Dilated 2.9 cm infrarenal abdominal aorta. Recommend follow-up ultrasound every 5 years. This recommendation follows ACR consensus guidelines: White Paper of the ACR Incidental Findings Committee II on Vascular Findings. J Am Coll Radiol 2013; 10:789-794. Electronically Signed   By: Ilona Sorrel M.D.   On: 07/31/2021 15:31   Result Date: 07/31/2021 CLINICAL DATA:  Stage IV non-small cell left lung cancer diagnosed May 2022 status post palliative radiation therapy completed 03/23/2021 and chemoimmunotherapy. Patient admitted with pneumonia, dyspnea and lower extremity swelling. Restaging. EXAM: CT CHEST, ABDOMEN, AND PELVIS WITH CONTRAST TECHNIQUE: Multidetector CT imaging of the chest, abdomen and pelvis was performed following the standard protocol during bolus administration of intravenous contrast. CONTRAST:  45mL OMNIPAQUE IOHEXOL 350 MG/ML SOLN COMPARISON:  06/02/2021 CT chest, abdomen and pelvis. FINDINGS: CT CHEST FINDINGS Cardiovascular: Top-normal heart size. Stable small pericardial effusion/thickening. Three-vessel coronary atherosclerosis. Right internal jugular Port-A-Cath terminates in the lower third of the SVC. Atherosclerotic thoracic aorta with stable dilated 4.0 cm ascending thoracic aorta. Dilated main pulmonary artery (4.6 cm diameter). No central pulmonary emboli. Mediastinum/Nodes: No discrete thyroid nodules. Mild circumferential wall thickening in the lower thoracic esophagus, not definitely changed. No pathologically enlarged axillary or mediastinal lymph nodes (previously described mildly enlarged subcarinal lymph node is favored to represent the collapsed esophagus in retrospect).  Mildly enlarged 1.3 cm right hilar node (series 2/image 31), increased mildly from 1.0 cm. Lungs/Pleura: No pneumothorax. Trace dependent right pleural effusion, new. Small loculated basilar left pleural effusion, stable. Central medial left lower lobe 2.9 x 2.3 cm solid pulmonary nodule (series 2/image 34), previously 2.9 x 2.2 cm using similar measurement technique, not appreciably changed. New widespread patchy ground-glass opacity and interlobular septal thickening (crazy paving pattern) throughout both lungs, most prominent in right upper lobe. Dense patchy left perihilar and lower left lung consolidation with associated bronchiectasis, volume loss and distortion, similar to mildly increased, favor evolving postradiation change. No new significant pulmonary nodules. Musculoskeletal: No aggressive appearing focal osseous lesions. Moderate thoracic spondylosis. CT ABDOMEN PELVIS FINDINGS Hepatobiliary: Hypodense 2.6 x 1.5 cm caudate lobe liver mass (series 2/image 59), previously 2.6 x 1.7 cm using similar measurement technique, not appreciably changed. No  additional liver lesions. Normal gallbladder with no radiopaque cholelithiasis. No biliary ductal dilatation. Pancreas: Normal, with no mass or duct dilation. Spleen: Normal size. No mass. Adrenals/Urinary Tract: Normal adrenals. No hydronephrosis. Simple 1.3 cm anterior interpolar left renal cyst. Normal bladder. Stomach/Bowel: Normal non-distended stomach. Normal caliber small bowel with no small bowel wall thickening. Normal appendix. Moderate sigmoid diverticulosis with no large bowel wall thickening or significant pericolonic fat stranding. Vascular/Lymphatic: Atherosclerotic abdominal aorta with dilated 2.9 cm infrarenal abdominal aorta. Patent portal, splenic, hepatic and renal veins. No pathologically enlarged lymph nodes in the abdomen or pelvis. Reproductive: Top-normal size prostate with internal fiducial markers. Other: No pneumoperitoneum, ascites  or focal fluid collection. Musculoskeletal: No aggressive appearing focal osseous lesions. Moderate thoracic spondylosis. Bilateral posterior spinal fusion hardware at L5-S1. IMPRESSION: 1. New widespread patchy ground-glass opacity and interlobular septal thickening (crazy paving pattern) throughout both lungs, most prominent in the right upper lobe. Differential considerations include atypical/viral pneumonia, drug toxicity and hypersensitivity pneumonitis. 2. Stable central medial left lower lobe pulmonary nodule. Dense patchy left perihilar and lower left lung consolidation with associated bronchiectasis, volume loss and distortion, similar to mildly increased, favor evolving postradiation change. 3. Stable small loculated basilar left pleural effusion. New trace dependent right pleural effusion. 4. Mild right hilar adenopathy is mildly increased, nonspecific. 5. Stable caudate lobe liver metastasis. No new or progressive metastatic disease in the abdomen or pelvis. 6. Aortic Atherosclerosis (ICD10-I70.0). Electronically Signed: By: Ilona Sorrel M.D. On: 07/31/2021 15:13   DG CHEST PORT 1 VIEW  Result Date: 08/01/2021 CLINICAL DATA:  Shortness of breath. Non-small cell lung cancer on chemotherapy. EXAM: PORTABLE CHEST 1 VIEW COMPARISON:  Radiograph 2 days ago 07/30/2021.  CT yesterday. FINDINGS: Accessed right chest port in place. Planted loop recorder in the left chest wall. Again seen volume loss in the left hemithorax. Pleuroparenchymal opacity in the lower half of the left hemithorax is similar to prior exam. Stable heart size and mediastinal contours. Patchy airspace disease in the right lung primarily in the upper lobe corresponds to ground-glass opacities on CT, with interval progression. Small right pleural effusion on CT is not well seen by radiograph. No pneumothorax or other significant interval change. IMPRESSION: 1. Patchy airspace disease in the right lung corresponds to ground-glass opacities  on CT, with mild interval progression. 2. Otherwise unchanged exam. Chronic volume loss in the left hemithorax with pleuroparenchymal opacity in the left lower lung. Electronically Signed   By: Keith Rake M.D.   On: 08/01/2021 15:04        Scheduled Meds:  acidophilus   Oral Daily   arformoterol  15 mcg Nebulization BID   atorvastatin  40 mg Oral Daily   azithromycin  500 mg Oral Daily   carbidopa-levodopa  1 tablet Oral 3 times per day   chlorhexidine  15 mL Mouth Rinse BID   Chlorhexidine Gluconate Cloth  6 each Topical Daily   dofetilide  500 mcg Oral BID   fenofibrate  160 mg Oral Daily   fluticasone  2 spray Each Nare Daily   folic acid  1 mg Oral Daily   furosemide  40 mg Intravenous Q12H   guaiFENesin  1,200 mg Oral BID   ipratropium  1-2 spray Each Nare Q12H   ipratropium  0.5 mg Nebulization TID   levalbuterol  0.63 mg Nebulization TID   magnesium oxide  400 mg Oral Daily   mouth rinse  15 mL Mouth Rinse q12n4p   metoprolol succinate  25 mg  Oral Daily   multivitamin with minerals  1 tablet Oral Daily   pantoprazole  40 mg Oral Daily   potassium chloride  10 mEq Oral Daily   rivaroxaban  20 mg Oral Q supper   tamsulosin  0.4 mg Oral QPC supper   topiramate  100 mg Oral BID   Continuous Infusions:  cefTRIAXone (ROCEPHIN)  IV 2 g (08/01/21 0853)     LOS: 3 days    Time spent: 40 minutes    Irine Seal, MD [facility]   To contact the attending provider between 7A-7P or the covering provider during after hours 7P-7A, please log into the web site [email or web address] and access using universal [place] password for that web site. If you do not have the password, please call the hospital operator.  08/01/2021, 3:15 PM

## 2021-08-01 NOTE — Plan of Care (Signed)
  Problem: Education: Goal: Knowledge of General Education information will improve Description: Including pain rating scale, medication(s)/side effects and non-pharmacologic comfort measures Outcome: Progressing   Problem: Clinical Measurements: Goal: Will remain free from infection Outcome: Progressing Goal: Diagnostic test results will improve Outcome: Progressing   

## 2021-08-01 NOTE — Progress Notes (Signed)
Patient ID: Corey [name], male   DOB: [birth year], 75 y.o.   MRN: [id number]  MBS completed, full report to follow. Pt was tested with thin, ultrathin, nectar, pudding and cracker.  Pt presents with minimal oral dysphagia characterized by lingual pumping with liquid allowing a few episodes of premature spillage of liquid into pharynx to vallecular space or pyriform sinus prior to swallow trigger.  Pharyngeal swallow is strong with adequate motility without retention.  Decreased timing of laryngeal closure inconsistently resulted in very trace laryngeal penetration of thin but no aspiration.  Due to pt's cervical hardware- chin tuck posture not tested. Upon esophageal sweep, esophagus was clear. After presenting thin barium swallow - pt appeared with retrograde propulsion of liquid to thoracic esophagus causing SLP to question if this could be consistent with dysmotility and contributing to aspiration risk.  Defer to referring MD for indication of esophagram during this hospital coarse.  Pt also did not cough during MBS and he was challenged to consume sequential boluses.  Recommend for energy conservation given dyspnea to continue dys3/thin diet.  Will follow up briefly for family education. Pt, spouse and son were educated during [place].    Kathleen Lime, MS [facility] SLP Acute Rehab Services Office [phone] Pager [phone]

## 2021-08-01 NOTE — TOC Progression Note (Signed)
Transition of Care [facility]) - Progression Note    Patient Details  Name: Corey [name] MRN: [id number] Date of Birth: [birth year]  Transition [facility] CM/SW Contact  Ross Ludwig,  Phone Number: 08/01/2021, 2:26 PM  Clinical Narrative:     CSW contacted Inpatient rehab to see if they know when a bed will be available for this patient.  CSW awaiting for a decision from CIR.   Expected Discharge Plan: [place] Barriers to Discharge: Continued Medical Work up  Expected Discharge Plan and Services Expected Discharge Plan: [place]   Discharge Planning Services: CM Consult   Living arrangements for the past 2 months: Single Family Home                           HH Arranged: PT, OT, Social Work [employer] Agency: [place] Date HH Agency Contacted: 07/31/21 Time [place]: 1412 Representative spoke with at [place]: Cherl rose   Social Determinants of Health ([place]) Interventions    Readmission Risk Interventions No flowsheet data found.

## 2021-08-01 NOTE — Evaluation (Signed)
Clinical/Bedside Swallow Evaluation Patient Details  Name: Corey [name] MRN: [id number] Date of Birth: [birth year]  Today's Date: 08/01/2021 Time: SLP Start Time (ACUTE ONLY): 1025 SLP Stop Time (ACUTE ONLY): 1410 SLP Time Calculation (min) (ACUTE ONLY): 29 min  Past Medical History:  Past Medical History:  Diagnosis Date   Anemia    Anxiety    Arthritis    "back, right knee, hands, ankles, neck" (05/31/2016)   Carotid artery disease ([place]) 08/30/2016   Carotid US 11/19: R 1-39, L 40-59 // Carotid US 08/2019: R 1-39; L 40-59 // Carotid US 11/21: R 1-39; L 40-59>> repeat 1 year    Chronic lower back pain    Coronary atherosclerosis of native coronary artery    a. BMS to [facility] 2004 and 2007, otherwise mild nonobstructive disease. EF normal.   Diverticulitis    Dyslipidemia    Dyspnea    Essential hypertension, benign    Family history of breast cancer    Family history of lung cancer    Family history of prostate cancer    GERD (gastroesophageal reflux disease)    Lumbar radiculopathy, chronic 02/04/2015   Right L5   Obesity    OSA on CPAP    uses cpap   Paroxysmal atrial fibrillation ([place])    a. Discovered after stroke.   Pneumonia 01/1996   PONV (postoperative nausea and vomiting)    Prostate CA (HCC)    Recurrent upper respiratory infection (URI)    Stroke ([place]) 07/2012   no deficits   Visit for monitoring Tikosyn therapy 09/18/2019   Past Surgical History:  Past Surgical History:  Procedure Laterality Date   ADENOIDECTOMY     ANTERIOR CERVICAL DECOMP/DISCECTOMY FUSION  07/2001; 10/2002   "C5-6; C6-7; redo"   BACK SURGERY     CARPAL TUNNEL RELEASE Left 10/2015   CHEST TUBE INSERTION Left 03/17/2021   Procedure: INSERTION PLEURAL DRAINAGE CATHETER;  Surgeon: Garner Nash, DO;  Location: [place];  Service: Pulmonary;  Laterality: Left;  Indwelling Tunneled Pleural catheter (PLEUREX)    COLONOSCOPY W/ POLYPECTOMY  02/2014   CORONARY ANGIOPLASTY WITH STENT  PLACEMENT  05/2003; 12/2005   "mid RCA; mid RCA"   FINE NEEDLE ASPIRATION  02/26/2021   Procedure: FINE NEEDLE ASPIRATION (FNA) LINEAR;  Surgeon: Candee Furbish, MD;  Location: [facility] ENDOSCOPY;  Service: Pulmonary;;   HAND SURGERY  02/2019   LEFT HAND   implantable loop recorder placement  02/27/2020   [employer] Reveal Shelley model [id number]  implantable loop recorder implanted by Dr Rayann Heman for afib management and evaluation of presyncope   IR IMAGING GUIDED PORT INSERTION  04/16/2021   JOINT REPLACEMENT     KNEE ARTHROSCOPY Left 10/2005   KNEE ARTHROSCOPY W/ PARTIAL MEDIAL MENISCECTOMY Left 09/2005   LUMBAR LAMINECTOMY/DECOMPRESSION MICRODISCECTOMY  03/2005   "L4-5"   POSTERIOR LUMBAR FUSION  10/2003   L5-S1; "plates, screws"   SHOULDER ARTHROSCOPY Right 08/2011   Debridement of labrum, arthroscopic distal clavicle excision   SHOULDER OPEN ROTATOR CUFF REPAIR Left 07/2014   TEE WITHOUT CARDIOVERSION  07/07/2012   Procedure: TRANSESOPHAGEAL ECHOCARDIOGRAM (TEE);  Surgeon: Fay Records, MD;  Location[facility] ENDOSCOPY;  Service: Cardiovascular;  Laterality: N/A;   THORACENTESIS N/A 02/25/2021   Procedure: Mathews Robinsons;  Surgeon: Juanito Doom, MD;  Location: [place];  Service: Cardiopulmonary;  Laterality: N/A;   TONSILLECTOMY AND ADENOIDECTOMY  ~ [place] ARTHROPLASTY Left 10/2006   TRIGGER FINGER RELEASE Left 10/2015  VIDEO BRONCHOSCOPY WITH ENDOBRONCHIAL ULTRASOUND Left 02/26/2021   Procedure: VIDEO BRONCHOSCOPY WITH ENDOBRONCHIAL ULTRASOUND;  Surgeon: Candee Furbish, MD;  Location: [facility] ENDOSCOPY;  Service: Pulmonary;  Laterality: Left;  cryoprobe too thanks!   HPI:  75yo male admitted 07/28/21 with LLE swelling, fatigue and weakness. 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 CXR - unchanged LLL opacity. MRI pending. 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  Pt stated  his any deficits from 2013 CVA resolved.  Pt also with h/o ACDF 0/2002 and "re do" 10/1999 - C5-C6, C6-C7. Swallow eval ordered. RN reports pt's oxygen needs have increased overnight.  Pt denies difficulty with swallowing currently. Endorses reflux/odynophagia during XRT for which he had taken a PPI and carafate *at least.    Assessment / Plan / Recommendation  Clinical Impression  Patient greeted largely reclined while consuming a ? smoothie. SLP advised he sit upright for any po due to aspiration risk.  Mr Langham reports he uses caution with po PTA due to concerns for aspiration. Currently he presents with clinical indications of multifactorial dysphagia *pharyngo-cervical and esophageal c/b coughing after water intake.  Eructation also observed but pt denied reluxing. He is taking a PPI and previously had used Carafate due to XRT effects.  Voice is hoarse - he reports it "comes and goes" and admits to frequent coughing.  No focal CN deficit noted. Recommend MBS with cursory esophageal sweep to allow objective evaluation of swallowing.  Pt endorses coughing with cold water but not room temp - ? spasms?  Family and pt approved of plan for MBS. Xray and RN informed. SLP Visit Diagnosis: Dysphagia, unspecified (R13.10);Dysphagia, pharyngoesophageal phase (R13.14)    Aspiration Risk  Mild aspiration risk    Diet Recommendation     Liquid Administration via: Cup;Straw Supervision: Patient able to self feed Compensations: Slow rate;Small sips/bites Postural Changes: Seated upright at 90 degrees;Remain upright for at least 30 minutes after po intake    Other  Recommendations Oral Care Recommendations: Oral care BID    Recommendations for follow up therapy are one component of a multi-disciplinary discharge planning process, led by the attending physician.  Recommendations may be updated based on patient status, additional functional criteria and insurance authorization.  Follow up Recommendations   (TBD)      Frequency and Duration     TBD       Prognosis   good     Swallow Study   General HPI: 75yo male admitted 07/28/21 with LLE swelling, fatigue and weakness. 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 CXR - unchanged LLL opacity. MRI pending. 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  Pt stated his any deficits from 2013 CVA resolved.  Pt also with h/o ACDF 0/2002 and "re do" 10/1999 - C5-C6, C6-C7. Swallow eval ordered. RN reports pt's oxygen needs have increased overnight.  Pt denies difficulty with swallowing currently. Endorses reflux/odynophagia during XRT for which he had taken a PPI and carafate *at least. Type of Study: Bedside Swallow Evaluation Previous Swallow Assessment: none. SLE in 2013 Diet Prior to this Study: Dysphagia 3 (soft);Thin liquids Temperature Spikes Noted: No Respiratory Status: Nasal cannula (8 liters) History of Recent Intubation: No Behavior/Cognition: Alert;Cooperative;Pleasant mood Oral Cavity Assessment: Within Functional Limits Oral Care Completed by SLP: No Oral Cavity - Dentition: Adequate natural dentition Vision: Functional for self-feeding Self-Feeding Abilities: Able to feed  self Patient Positioning: Upright in bed Baseline Vocal Quality: Hoarse;Low vocal intensity (pt has been experiencing copious coughing, states voice comes and goes) Volitional Cough: Strong Volitional Swallow: Able to elicit    Oral/Motor/Sensory Function Overall Oral Motor/Sensory Function: Within functional limits   Ice Chips Ice chips: Not tested   Thin Liquid Thin Liquid: Impaired Presentation: Straw Pharyngeal  Phase Impairments: Cough - Delayed Other Comments: pt presented with cough within 15 seconds of sequentially swallowing water boluses; this was observed twice during evaluation - causing concern for thin liquid aspiraton    Nectar Thick Nectar  Thick Liquid: Not tested   Honey Thick Honey Thick Liquid: Not tested   Puree Puree: Not tested   Solid     Solid: Not tested     Kathleen Lime, MS [place] Office [phone] Pager [phone]  Macario Golds 08/01/2021,2:30 PM

## 2021-08-02 DIAGNOSIS — C3492 Malignant neoplasm of unspecified part of left bronchus or lung: Secondary | ICD-10-CM

## 2021-08-02 DIAGNOSIS — L03116 Cellulitis of left lower limb: Secondary | ICD-10-CM | POA: Diagnosis not present

## 2021-08-02 DIAGNOSIS — I480 Paroxysmal atrial fibrillation: Secondary | ICD-10-CM | POA: Diagnosis not present

## 2021-08-02 DIAGNOSIS — I10 Essential (primary) hypertension: Secondary | ICD-10-CM | POA: Diagnosis not present

## 2021-08-02 DIAGNOSIS — J189 Pneumonia, unspecified organism: Secondary | ICD-10-CM | POA: Diagnosis not present

## 2021-08-02 DIAGNOSIS — D649 Anemia, unspecified: Secondary | ICD-10-CM

## 2021-08-02 DIAGNOSIS — I669 Occlusion and stenosis of unspecified cerebral artery: Secondary | ICD-10-CM

## 2021-08-02 LAB — BASIC METABOLIC PANEL
Anion gap: 7 (ref 5–15)
BUN: 10 mg/dL (ref 8–23)
CO2: 23 mmol/L (ref 22–32)
Calcium: 8.1 mg/dL — ABNORMAL LOW (ref 8.9–10.3)
Chloride: 99 mmol/L (ref 98–111)
Creatinine, Ser: 0.61 mg/dL (ref 0.61–1.24)
GFR, Estimated: >60 mL/min (ref >60)
Glucose, Bld: 94 mg/dL (ref 70–99)
Potassium: 3.4 mmol/L — ABNORMAL LOW (ref 3.5–5.1)
Sodium: 129 mmol/L — ABNORMAL LOW (ref 135–145)

## 2021-08-02 LAB — CBC WITH DIFFERENTIAL/PLATELET
Abs Immature Granulocytes: 0.09 10*3/uL — ABNORMAL HIGH (ref 0.00–0.07)
Basophils Absolute: 0 10*3/uL (ref 0.0–0.1)
Basophils Relative: 0 %
Eosinophils Absolute: 0.1 10*3/uL (ref 0.0–0.5)
Eosinophils Relative: 1 %
HCT: 21.6 % — ABNORMAL LOW (ref 39.0–52.0)
Hemoglobin: 7 g/dL — ABNORMAL LOW (ref 13.0–17.0)
Immature Granulocytes: 1 %
Lymphocytes Relative: 3 %
Lymphs Abs: 0.2 10*3/uL — ABNORMAL LOW (ref 0.7–4.0)
MCH: 31.1 pg (ref 26.0–34.0)
MCHC: 32.4 g/dL (ref 30.0–36.0)
MCV: 96 fL (ref 80.0–100.0)
Monocytes Absolute: 0.6 10*3/uL (ref 0.1–1.0)
Monocytes Relative: 10 %
Neutro Abs: 5.3 10*3/uL (ref 1.7–7.7)
Neutrophils Relative %: 85 %
Platelets: 146 10*3/uL — ABNORMAL LOW (ref 150–400)
RBC: 2.25 MIL/uL — ABNORMAL LOW (ref 4.22–5.81)
RDW: 19.4 % — ABNORMAL HIGH (ref 11.5–15.5)
WBC: 6.3 10*3/uL (ref 4.0–10.5)
nRBC: 0 % (ref 0.0–0.2)

## 2021-08-02 LAB — CULTURE, BLOOD (ROUTINE X 2)
Culture: NO GROWTH
Special Requests: ADEQUATE

## 2021-08-02 LAB — PREPARE RBC (CROSSMATCH)

## 2021-08-02 LAB — CULTURE, BLOOD (SINGLE)
Culture: NO GROWTH
Special Requests: ADEQUATE

## 2021-08-02 LAB — MAGNESIUM: Magnesium: 1.8 mg/dL (ref 1.7–2.4)

## 2021-08-02 MED ORDER — SODIUM CHLORIDE 0.9% IV SOLUTION: Freq: Once | INTRAVENOUS | Status: DC

## 2021-08-02 MED ORDER — FLUCONAZOLE IN SODIUM CHLORIDE 200-0.9 MG/100ML-% IV SOLN
200.0000 mg | INTRAVENOUS | Status: DC
  Administered 2021-08-02: 200 mg via INTRAVENOUS
  Filled 2021-08-02 (×2): qty 100

## 2021-08-02 MED ORDER — ACETAMINOPHEN 325 MG PO TABS
650.0000 mg | ORAL_TABLET | Freq: Once | ORAL | Status: AC
  Administered 2021-08-02: 650 mg via ORAL
  Filled 2021-08-02: qty 2

## 2021-08-02 MED ORDER — POTASSIUM CHLORIDE CRYS ER 10 MEQ PO TBCR
10.0000 meq | EXTENDED_RELEASE_TABLET | Freq: Every day | ORAL | Status: DC
  Administered 2021-08-04 – 2021-08-12 (×9): 10 meq via ORAL
  Filled 2021-08-02 (×10): qty 1

## 2021-08-02 MED ORDER — METRONIDAZOLE 500 MG/100ML IV SOLN: 500.0000 mg | Freq: Two times a day (BID) | INTRAVENOUS | Status: DC

## 2021-08-02 MED ORDER — DIPHENHYDRAMINE HCL 25 MG PO CAPS
25.0000 mg | ORAL_CAPSULE | Freq: Once | ORAL | Status: AC
  Administered 2021-08-02: 25 mg via ORAL
  Filled 2021-08-02: qty 1

## 2021-08-02 MED ORDER — MAGNESIUM SULFATE 4 GM/100ML IV SOLN
4.0000 g | Freq: Once | INTRAVENOUS | Status: AC
  Administered 2021-08-02: 4 g via INTRAVENOUS
  Filled 2021-08-02: qty 100

## 2021-08-02 MED ORDER — METRONIDAZOLE 500 MG/100ML IV SOLN
500.0000 mg | Freq: Two times a day (BID) | INTRAVENOUS | Status: DC
  Administered 2021-08-02 – 2021-08-04 (×4): 500 mg via INTRAVENOUS
  Filled 2021-08-02 (×4): qty 100

## 2021-08-02 MED ORDER — BUDESONIDE 0.5 MG/2ML IN SUSP
0.5000 mg | Freq: Two times a day (BID) | RESPIRATORY_TRACT | Status: DC
  Administered 2021-08-02 – 2021-08-12 (×21): 0.5 mg via RESPIRATORY_TRACT
  Filled 2021-08-02 (×21): qty 2

## 2021-08-02 MED ORDER — FUROSEMIDE 10 MG/ML IJ SOLN: 20.0000 mg | Freq: Once | INTRAMUSCULAR | Status: DC

## 2021-08-02 MED ORDER — POTASSIUM CHLORIDE CRYS ER 20 MEQ PO TBCR
40.0000 meq | EXTENDED_RELEASE_TABLET | ORAL | Status: AC
  Administered 2021-08-02 (×2): 40 meq via ORAL
  Filled 2021-08-02 (×2): qty 2

## 2021-08-02 NOTE — TOC Progression Note (Signed)
Transition of Care [facility]) - Progression Note    Patient Details  Name: Corey [name] MRN: [id number] Date of Birth: 18-Oct-1945  [facility] CM/SW Contact  Jaden Batchelder, Juliann Pulse, RN Phone Number: 08/02/2021, 3:55 PM  Clinical Narrative: CIR following await recc.      Expected Discharge Plan: IP Rehab Facility Barriers to Discharge: Continued Medical Work up  Expected Discharge Plan and Services Expected Discharge Plan: [place]   Discharge Planning Services: CM Consult   Living arrangements for the past 2 months: Single Family Home                           HH Arranged: PT, OT, Social Work [employer] Agency: [place] Date [place]: 07/31/21 Time [place]: 1412 Representative spoke with at [place]: Cherl rose   Social Determinants of Health ([place]) Interventions    Readmission Risk Interventions No flowsheet data found.

## 2021-08-02 NOTE — Progress Notes (Signed)
PROGRESS NOTE    Corey [name]  BLT:[id number] DOB: [birth year] DOA: 07/28/2021 PCP: [name], MD   Chief Complaint  Patient presents with   Fatigue   Shortness of Breath    Brief Narrative:  Patient pleasant 75 year old gentleman history of non-small cell lung cancer on chemotherapy, OSA, A. fib, coronary artery disease presented with left lower extremity swelling and generalized weakness.  Patient noted to have recently been treated with Levaquin for pneumonia which she completed the day prior to his erythema.  Patient presented with shortness of breath from pneumonia.  Chest x-ray done showed unchanged left lower lobe opacity, no fevers recorded, patient on also noted with a left lower extremity cellulitis.  Patient placed on IV vancomycin cefepime and admitted for further evaluation and management.  Patient noted the night of admission 12 intractable nausea and vomiting and abdominal films and chest x-ray ordered.   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 -Was on IV vancomycin and IV Rocephin.   -IV vancomycin discontinued.   -Blood cultures with no growth to date.   -IV Rocephin.   -Supportive care.  2.  Acute respiratory failure with hypoxia likely secondary to  aspiration pneumonia -Patient noted to have worsening respiratory status with increased O2 requirements the evening of 07/31/2021. -It is noted that patient had a sats of 77% on room air the evening on 07/31/2021, patient noted to have required up to 8 L high flow nasal cannula on 08/01/2021.   -BNP at 24.3.  -ABG with a pH of 7.4/PCO2 of 32/PO2 of 114/bicarb of 20.   -Patient placed on Lasix 40 mg IV every 12 hours with urine output of 3.5 L over the past 24 hours. -Some clinical improvement currently on 4 L nasal cannula with sats of 96 to 98%. -Continue Lasix 40 mg IV every 12 hours for 2 more doses then subsequently discontinue. -Continue IV Rocephin, IV azithromycin. -Add IV Flagyl for anaerobic coverage. -Continue scheduled Xopenex and Atrovent nebs. -Supportive care. -Case discussed with critical care, Dr. Lamonte Sakai who reviewed patient's chest x-rays, CT scans and is in agreement with current medical management.  If patient's condition was to worsen and hypoxia worsens we will get a formal PCCM consultation.  3.  Nausea vomiting -Patient noted to have intractable nausea vomiting the evening of 07/29/2021, morning of 07/30/2021 . -Patient placed on IV Compazine and IV Zofran with clinical improvement.  -Patient stated had a bowel movement.   -Improving clinically with no further nausea or vomiting and tolerating clear liquids. -Abdominal films with nonobstructive bowel gas pattern, mild gaseous distention of the stomach.   -Chest x-ray with new faint patchy opacity right midlung, differential includes mild atelectasis, aspiration or pneumonia.  Stable volume loss and patchy  consolidation in the mid to lower left lung compatible with posttreatment change. -Patient initially placed on n.p.o. pending abdominal films, abdominal films with no obstructive pattern. -Patient placed on a full liquid diet which he tolerated, and has been advanced to a dysphagia 3 diet which he is currently tolerating.   -Patient being followed by SLP and underwent MBS.  -Supportive care.   4.  Probable aspiration pneumonia /CAP -Patient noted with intractable nausea and vomiting the evening of 07/30/2019 2 in the morning of 07/30/2021.  -Repeat chest x-ray done 08/01/2021 with patchy AICD in the right lung corresponds to groundglass opacities on CT, with mild interval progression.  Chronic volume loss in the left hemothorax with pleural parenchymal opacity in the left lower lung.  -MRSA PCR negative.   -Discontinued IV vancomycin.   -Continue IV Rocephin, IV azithromycin. -Due to acute respiratory failure with hypoxia and worsening chest x-ray with concerns for aspiration pneumonia we will add IV Flagyl to current regimen. -Continue Mucinex, Brovana and Incruse nebs, Flonase.  5.  Hypokalemia/hypomagnesemia/hyponatremia -Improving with hydration. -Potassium at 3.4, magnesium at 1.8.   -Magnesium sulfate 2 g IV x1.   -Patient on Tikosyn and as such goal is to keep potassium around 4, magnesium at 2.   -Repeat labs in the morning.    6.  Stage IV non-small cell lung cancer -On palliative chemotherapy per oncology, Dr. Lorna Few. -Continue nebs/inhalers. -Patient states was post to have some staging scans done.  -Patient's oncologist ordered staging CT scans on 07/31/2021.   -Oncology informed of admission via epic. -Per oncology  7.  Paroxysmal atrial fibrillation -Patient with intractable nausea vomiting unable to tolerate any oral intake this morning. -Intractable nausea and vomiting improved/resolved.   -Patient transitioned from IV Lopressor to oral home dose Lopressor.  -Continue Tikosyn.   -Keep potassium approximately 4, keep magnesium approximately 2.   -Was on full dose Lovenox and transitioned back to home dose Xarelto.   -Supportive care.    8.  Hyperlipidemia -Continue fenofibrate, Lipitor.   9.  Coronary artery disease/hypertension -IV metoprolol has been transition back  to home dose oral metoprolol.  Continue statin, anticoagulation.   10.  Parkinson's -Continue Sinemet.  11.  Prolonged QT -Repeat EKG with resolution of QT prolongation currently at 467 from 530 on admission. -Magnesium currently at 1.8, potassium at 3.4.   -Magnesium sulfate 2 g IV x1.   -KDur 40 mEq p.o. every 4 hours x2 doses. -Resume home regimen KDur 10 mEq daily starting tomorrow. -Keep potassium approximately at 4, magnesium approximately 2. -Follow.  12.  OSA -Nausea and vomiting improved no further nausea and vomiting noted.   -CPAP nightly per  13.  History of CVA -No overt bleeding.  -Continue Xarelto for secondary stroke prophylaxis.  -Statin.    14.  Generalized weakness -PT/OT. -PT recommending SNF however patient and family would prefer to go home with home health therapies. -Patient assessed by CIR and evaluation pending to see whether patient is an appropriate candidate. -TOC consulted for home health therapies.  15.  Anemia -Likely secondary to chemotherapy. -Patient with no overt bleeding. -Hemoglobin at 7.0. -Transfused 2 units packed red blood cells. -Follow H&H.   DVT prophylaxis: Xarelto.  Code Status: DNR Family Communication: Updated patient, no family at bedside.   Disposition:   Status is: Inpatient  Remains inpatient appropriate because: Inpatient treatment necessary.  Patient noted to have nausea and vomiting overnight with inability to keep anything down.  Consultants:  Oncology informed of admission via epic.  Procedures:  Abdominal x-ray 07/30/2021 Chest x-ray 07/30/2021, 08/01/2021 Bilateral lower extremity Dopplers 07/30/2021 CT chest/CT abdomen and pelvis 07/31/2021 Transfused 2 units packed red blood cells pending 08/02/2021  Antimicrobials:  IV azithromycin 07/30/2021>>>>>> IV cefepime 1025 2022x1 dose IV Rocephin 07/29/2021>>>>> IV vancomycin 07/28/2021>>>>>> 07/31/2021 IV Flagyl 08/02/2021 IV Diflucan  08/02/2021    Subjective: Patient laying in bed.  More alert today.  Less lethargic.  States significant weakness.  Patient states tried to get up to ambulate with some shortness of breath.  Feels shortness of breath is improved today since yesterday.  Currently on 4 L high flow nasal cannula.  Had good urine output.  No nausea or vomiting.  Tolerating current diet.  Wife and daughter at bedside.  Tolerating dysphagia 3 diet.  Objective: Vitals:   08/02/21 1041 08/02/21 1249 08/02/21 1342 08/02/21 1422  BP:  113/61 112/64 105/85  Pulse:  97 97 100  Resp:  20 16 16   Temp:  99.3 F (37.4 C) 98.3 F (36.8 C) 99 F (37.2 C)  TempSrc:  Oral Oral Oral  SpO2: 94% 94% 96% 98%  Weight:      Height:        Intake/Output Summary (Last 24 hours) at 08/02/2021 1557 Last data filed at 08/02/2021 0528 Gross per 24 hour  Intake --  Output 2400 ml  Net -2400 ml   Filed Weights   07/28/21 1046 07/29/21 0510 08/02/21 0531  Weight: 108 kg 102.1 kg 102.2 kg    Examination:  General exam: NAD. Respiratory system: Some coarse breath sounds in the left base.  Decreased breath sounds in the bases.  No wheezing noted.  Fair air movement.  No use of accessory muscles of respiration.   Cardiovascular system: RRR no murmurs rubs or gallops.  No JVD.  No lower extremity edema.  Gastrointestinal system: Abdomen is soft, nontender, nondistended, positive bowel sounds.  No rebound.  No guarding.  Central nervous system: Alert and oriented. No focal neurological deficits. Extremities: Left lower extremity with demarcated cellulitis with decreasing erythema, decreased warmth.. Skin: No rashes, lesions or ulcers Psychiatry: Judgement and insight appear normal. Mood & affect appropriate.     Data Reviewed: I have personally reviewed following labs and imaging studies  CBC: Recent Labs  Lab 07/29/21 0648 07/30/21 0500 07/31/21 0400 08/01/21 0457 08/02/21 0321  WBC 7.0 8.9 7.8 6.4 6.3  NEUTROABS  5.9  --  6.7 5.4 5.3  HGB 8.1* 8.6* 7.3* 7.3* 7.0*  HCT 25.1* 26.2* 23.2* 22.2* 21.6*  MCV 96.2 94.9 97.5 96.5 96.0  PLT 73* 115* 128* 140* 146*    Basic Metabolic Panel: Recent Labs  Lab 07/29/21 0648 07/30/21 0500 07/31/21 0400 08/01/21 0457 08/02/21 0321  NA 129* 134* 133* 130* 129*  K 3.4* 3.5 4.1 3.9 3.4*  CL 99 101 103 104 99  CO2 21* 24 21* 21* 23  GLUCOSE 109* 123* 90 91 94  BUN 14 14 13 9 10   CREATININE 0.73 0.66 0.58* 0.64 0.61  CALCIUM 8.3* 8.2* 8.1* 8.0* 8.1*  MG 1.6* 2.0 1.8 1.7 1.8  PHOS  --   --  2.3*  --   --     GFR: Estimated Creatinine Clearance: 102.6 mL/min (by C-G formula based on SCr of 0.61 mg/dL).  Liver Function Tests: Recent Labs  Lab 07/28/21 1109 07/29/21 0648 07/30/21 0500 07/31/21 0400  AST 27 27 38  --   ALT 16 15 14   --  ALKPHOS 56 49 54  --   BILITOT 0.8 0.8 0.8  --   PROT 6.8 6.4* 6.7  --   ALBUMIN 3.0* 2.2* 2.3* 1.8*    CBG: No results for input(s): GLUCAP in the last 168 hours.   Recent Results (from the past 240 hour(s))  Blood culture (routine single)     Status: None   Collection Time: 07/28/21 11:44 AM   Specimen: BLOOD LEFT ARM  Result Value Ref Range Status   Specimen Description   Final    BLOOD LEFT ARM Performed at [facility], [address] [place], [place] [address]    Special Requests   Final    Blood Culture adequate volume Performed at [facility], [address] [place], [place] [address]    Culture   Final    NO GROWTH 5 DAYS Performed at [facility], [place] [address] [place], [place] [address]    Report Status 08/02/2021 FINAL  Final  Culture, blood (routine x 2)     Status: None   Collection Time: 07/28/21  2:03 PM   Specimen: BLOOD  Result Value Ref Range Status   Specimen Description   Final    BLOOD CENTRAL LINE BLOOD RIGHT ARM Performed at [facility], [address] [place], [place] [address]    Special  Requests   Final    BOTTLES DRAWN AEROBIC AND ANAEROBIC Blood Culture adequate volume Performed at [facility], [address] [place], [place] [address]    Culture   Final    NO GROWTH 5 DAYS Performed at [facility], [place] [address] [place], [place] [address]    Report Status 08/02/2021 FINAL  Final  Resp Panel by RT-PCR (Flu A&B, Covid) Nasopharyngeal Swab     Status: None   Collection Time: 07/28/21  3:35 PM   Specimen: Nasopharyngeal Swab; Nasopharyngeal(NP) swabs in vial transport medium  Result Value Ref Range Status   SARS Coronavirus 2 by RT PCR NEGATIVE NEGATIVE Final    Comment: (NOTE) SARS-CoV-2 target nucleic acids are NOT DETECTED.  The SARS-CoV-2 RNA is generally detectable in upper respiratory specimens during the acute phase of infection. The lowest concentration of SARS-CoV-2 viral copies this assay can detect is 138 copies/mL. A negative result does not preclude SARS-Cov-2 infection and should not be used as the sole basis for treatment or other patient management decisions. A negative result may occur with  improper specimen collection/handling, submission of specimen other than nasopharyngeal swab, presence of viral mutation(s) within the areas targeted by this assay, and inadequate number of viral copies(<138 copies/mL). A negative result must be combined with clinical observations, patient history, and epidemiological information. The expected result is Negative.  Fact Sheet for Patients:  EntrepreneurPulse.com.au  Fact Sheet for Healthcare Providers:  IncredibleEmployment.be  This test is no t yet approved or cleared by the [place] FDA and  has been authorized for detection and/or diagnosis of SARS-CoV-2 by FDA under an Emergency Use Authorization (EUA). This EUA will remain  in effect (meaning this test can be used) for the duration of the COVID-19 declaration under Section 564(b)(1)  of the Act, 21 U.S.C.section 360bbb-3(b)(1), unless the authorization is terminated  or revoked sooner.       Influenza A by PCR NEGATIVE NEGATIVE Final   Influenza B by PCR NEGATIVE NEGATIVE Final    Comment: (NOTE) The Xpert Xpress SARS-CoV-2/FLU/RSV plus assay is intended as an aid in the diagnosis of  influenza from Nasopharyngeal swab specimens and should not be used as a sole basis for treatment. Nasal washings and aspirates are unacceptable for Xpert Xpress SARS-CoV-2/FLU/RSV testing.  Fact Sheet for Patients: EntrepreneurPulse.com.au  Fact Sheet for Healthcare Providers: IncredibleEmployment.be  This test is not yet approved or cleared by the [place] FDA and has been authorized for detection and/or diagnosis of SARS-CoV-2 by FDA under an Emergency Use Authorization (EUA). This EUA will remain in effect (meaning this test can be used) for the duration of the COVID-19 declaration under Section 564(b)(1) of the Act, 21 U.S.C. section 360bbb-3(b)(1), unless the authorization is terminated or revoked.  Performed at [facility], [address], [place], [place] [address]   Expectorated Sputum Assessment w Gram Stain, Rflx to Resp Cult     Status: None   Collection Time: 07/30/21  7:57 AM   Specimen: Sputum  Result Value Ref Range Status   Specimen Description SPUTUM  Final   Special Requests NONE  Final   Sputum evaluation   Final    THIS SPECIMEN IS ACCEPTABLE FOR SPUTUM CULTURE Performed at [facility], [place] [address] [place], [place] [address]    Report Status 07/30/2021 FINAL  Final  Culture, Respiratory w Gram Stain     Status: None   Collection Time: 07/30/21  7:57 AM   Specimen: SPU  Result Value Ref Range Status   Specimen Description   Final    SPUTUM Performed at [facility] [address], [place], [place] [address]    Special Requests   Final    NONE  Reflexed from [id number] Performed at [facility] [address], [place], [place] [address]    Gram Stain   Final    RARE SQUAMOUS EPITHELIAL CELLS PRESENT RARE WBC PRESENT, PREDOMINANTLY PMN RARE GRAM NEGATIVE RODS    Culture   Final    RARE CANDIDA ALBICANS RARE VIRIDANS STREPTOCOCCUS Standardized susceptibility testing for this organism is not available. Performed at [facility], [place] [address], [place], [place] [address]    Report Status 08/01/2021 FINAL  Final  MRSA Next Gen by PCR, Nasal     Status: None   Collection Time: 07/31/21  8:12 AM   Specimen: Nasal Mucosa; Nasal Swab  Result Value Ref Range Status   MRSA by PCR Next Gen NOT DETECTED NOT DETECTED Final    Comment: (NOTE) The GeneXpert MRSA Assay (FDA approved for NASAL specimens only), is one component of a comprehensive MRSA colonization surveillance program. It is not intended to diagnose MRSA infection nor to guide or monitor treatment for MRSA infections. Test performance is not FDA approved in patients less than 58 years old. Performed at [facility], [place] [address], [place], [place] [address]           Radiology Studies: DG CHEST PORT 1 VIEW  Result Date: 08/01/2021 CLINICAL DATA:  Shortness of breath. Non-small cell lung cancer on chemotherapy. EXAM: PORTABLE CHEST 1 VIEW COMPARISON:  Radiograph 2 days ago 07/30/2021.  CT yesterday. FINDINGS: Accessed right chest port in place. Planted loop recorder in the left chest wall. Again seen volume loss in the left hemithorax. Pleuroparenchymal opacity in the lower half of the left hemithorax is similar to prior exam. Stable heart size and mediastinal contours. Patchy airspace disease in the right lung primarily in the upper lobe corresponds to ground-glass opacities on CT, with interval progression. Small right pleural effusion on CT is not  well seen by radiograph. No pneumothorax or other significant interval change.  IMPRESSION: 1. Patchy airspace disease in the right lung corresponds to ground-glass opacities on CT, with mild interval progression. 2. Otherwise unchanged exam. Chronic volume loss in the left hemithorax with pleuroparenchymal opacity in the left lower lung. Electronically Signed   By: Keith Rake M.D.   On: 08/01/2021 15:04   DG Swallowing Func-Speech Pathology  Result Date: 08/01/2021 Table formatting from the original result was not included. Objective Swallowing Evaluation: Type of Study: MBS-Modified Barium Swallow Study  Patient Details Name: GHASSAN COGGESHALL MRN: [id number] Date of Birth: Jun 14, 1946 Today's Date: 08/01/2021 Time: SLP Start Time (ACUTE ONLY): 1450 -SLP Stop Time (ACUTE ONLY): 1515 SLP Time Calculation (min) (ACUTE ONLY): 25 min Past Medical History: Past Medical History: Diagnosis Date  Anemia   Anxiety   Arthritis   "back, right knee, hands, ankles, neck" (05/31/2016)  Carotid artery disease ([place]) 08/30/2016  Carotid US 11/19: R 1-39, L 40-59 // Carotid US 08/2019: R 1-39; L 40-59 // Carotid US 11/21: R 1-39; L 40-59>> repeat 1 year   Chronic lower back pain   Coronary atherosclerosis of native coronary artery   a. BMS to [facility] 2004 and 2007, otherwise mild nonobstructive disease. EF normal.  Diverticulitis   Dyslipidemia   Dyspnea   Essential hypertension, benign   Family history of breast cancer   Family history of lung cancer   Family history of prostate cancer   GERD (gastroesophageal reflux disease)   Lumbar radiculopathy, chronic 02/04/2015  Right L5  Obesity   OSA on CPAP   uses cpap  Paroxysmal atrial fibrillation ([place])   a. Discovered after stroke.  Pneumonia 01/1996  PONV (postoperative nausea and vomiting)   Prostate CA (HCC)   Recurrent upper respiratory infection (URI)   Stroke ([place]) 07/2012  no deficits  Visit for monitoring Tikosyn therapy 09/18/2019 Past Surgical History: Past Surgical History: Procedure Laterality Date  ADENOIDECTOMY    ANTERIOR CERVICAL DECOMP/DISCECTOMY  FUSION  07/2001; 10/2002  "C5-6; C6-7; redo"  BACK SURGERY    CARPAL TUNNEL RELEASE Left 10/2015  CHEST TUBE INSERTION Left 03/17/2021  Procedure: INSERTION PLEURAL DRAINAGE CATHETER;  Surgeon: Garner Nash, DO;  Location: [facility];  Service: Pulmonary;  Laterality: Left;  Indwelling Tunneled Pleural catheter (PLEUREX)   COLONOSCOPY W/ POLYPECTOMY  02/2014  CORONARY ANGIOPLASTY WITH STENT PLACEMENT  05/2003; 12/2005  "mid RCA; mid RCA"  FINE NEEDLE ASPIRATION  02/26/2021  Procedure: FINE NEEDLE ASPIRATION (FNA) LINEAR;  Surgeon: Candee Furbish, MD;  Location: [facility] ENDOSCOPY;  Service: Pulmonary;;  HAND SURGERY  02/2019  LEFT HAND  implantable loop recorder placement  02/27/2020  [employer] Reveal Point Arena model [id number]  implantable loop recorder implanted by Dr Rayann Heman for afib management and evaluation of presyncope  IR IMAGING GUIDED PORT INSERTION  04/16/2021  JOINT REPLACEMENT    KNEE ARTHROSCOPY Left 10/2005  KNEE ARTHROSCOPY W/ PARTIAL MEDIAL MENISCECTOMY Left 09/2005  LUMBAR LAMINECTOMY/DECOMPRESSION MICRODISCECTOMY  03/2005  "L4-5"  POSTERIOR LUMBAR FUSION  10/2003  L5-S1; "plates, screws"  SHOULDER ARTHROSCOPY Right 08/2011  Debridement of labrum, arthroscopic distal clavicle excision  SHOULDER OPEN ROTATOR CUFF REPAIR Left 07/2014  TEE WITHOUT CARDIOVERSION  07/07/2012  Procedure: TRANSESOPHAGEAL ECHOCARDIOGRAM (TEE);  Surgeon: Fay Records, MD;  Location: [facility] ENDOSCOPY;  Service: Cardiovascular;  Laterality: N/A;  THORACENTESIS N/A 02/25/2021  Procedure: Mathews Robinsons;  Surgeon: Juanito Doom, MD;  Location: [facility];  Service: Cardiopulmonary;  Laterality: N/A;  TONSILLECTOMY AND ADENOIDECTOMY  ~  1956  TOTAL KNEE ARTHROPLASTY Left 10/2006  TRIGGER FINGER RELEASE Left 10/2015  VIDEO BRONCHOSCOPY WITH ENDOBRONCHIAL ULTRASOUND Left 02/26/2021  Procedure: VIDEO BRONCHOSCOPY WITH ENDOBRONCHIAL ULTRASOUND;  Surgeon: Candee Furbish, MD;  Location: [facility] ENDOSCOPY;  Service: Pulmonary;  Laterality: Left;   cryoprobe too thanks! HPI: 75yo male admitted 07/28/21 with LLE swelling, fatigue and weakness. 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 CXR - unchanged LLL opacity. MRI pending. 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  Pt stated his any deficits from 2013 CVA resolved.  Pt also with h/o ACDF 0/2002 and "re do" 10/1999 - C5-C6, C6-C7. Swallow eval ordered. RN reports pt's oxygen needs have increased overnight.  Pt denies difficulty with swallowing currently. Endorses reflux/odynophagia during XRT for which he had taken a PPI and carafate *at least.  Subjective: pt awake in chair Assessment / Plan / Recommendation CHL IP CLINICAL IMPRESSIONS 08/01/2021 Clinical Impression Pt was tested with thin, ultrathin (50% water with thin barium mixture), nectar, pudding and cracker.  Pt presents with minimal oral dysphagia characterized by lingual pumping with liquid allowing a few episodes of premature spillage of liquid into pharynx to vallecular space or pyriform sinus prior to swallow trigger.  Pharyngeal swallow is strong with adequate motility without retention.  Decreased timing of laryngeal closure inconsistently resulted in very trace laryngeal penetration of thin but no aspiration.  Due to pt's cervical hardware- chin tuck posture not tested. Upon esophageal sweep, esophagus was clear. After presenting thin barium swallow - pt appeared with retrograde propulsion of liquid to thoracic esophagus causing SLP to question if this could be consistent with dysmotility and contributing to aspiration risk. Defer to referring MD for indication of esophagram during this hospital coarse.  Pt also did not cough during MBS and he was challenged to consume sequential boluses.  Recommend for energy conservation given dyspnea to continue dys3/thin diet.  Will follow up briefly for family education. Pt, spouse and son were  educated during [place]. SLP Visit Diagnosis Dysphagia, oral phase (R13.11) Attention and concentration deficit following -- Frontal lobe and executive function deficit following -- Impact on safety and function Mild aspiration risk   CHL IP TREATMENT RECOMMENDATION 08/01/2021 Treatment Recommendations Therapy as outlined in treatment plan below   Prognosis 08/01/2021 Prognosis for Safe Diet Advancement Good Barriers to Reach Goals -- Barriers/Prognosis Comment -- CHL IP DIET RECOMMENDATION 08/01/2021 SLP Diet Recommendations Dysphagia 3 (Mech soft) solids;Thin liquid Liquid Administration via Cup;[place] Medication Administration Other (Comment) Compensations Slow rate;Small sips/bites Postural Changes Remain semi-upright after after feeds/meals (Comment);Seated upright at 90 degrees   CHL IP OTHER RECOMMENDATIONS 08/01/2021 Recommended Consults -- Oral Care Recommendations Oral care BID Other Recommendations --   CHL IP FOLLOW UP RECOMMENDATIONS 08/01/2021 Follow up Recommendations (No Data)   [facility] IP FREQUENCY AND DURATION 08/01/2021 Speech Therapy Frequency (ACUTE ONLY) min 1 x/week Treatment Duration 1 week      [facility] IP ORAL PHASE 08/01/2021 Oral Phase Impaired Oral - Pudding Teaspoon -- Oral - Pudding Cup -- Oral - Honey Teaspoon -- Oral - Honey Cup -- Oral - Nectar Teaspoon -- Oral - Nectar Cup -- Oral - Nectar Straw WFL Oral - Thin Teaspoon -- Oral - Thin Cup WFL;Premature spillage Oral - Thin Straw WFL;Premature spillage Oral - Puree Lingual pumping;Delayed oral transit Oral - Mech Soft WFL Oral - Regular -- Oral - Multi-Consistency -- Oral - Pill -- Oral Phase - Comment --  CHL IP  PHARYNGEAL PHASE 08/01/2021 Pharyngeal Phase Impaired Pharyngeal- Pudding Teaspoon -- Pharyngeal -- Pharyngeal- Pudding Cup -- Pharyngeal -- Pharyngeal- Honey Teaspoon -- Pharyngeal -- Pharyngeal- Honey Cup -- Pharyngeal -- Pharyngeal- Nectar Teaspoon -- Pharyngeal -- Pharyngeal- Nectar Cup -- Pharyngeal -- Pharyngeal- Nectar Straw  Penetration/Aspiration during swallow Pharyngeal Material enters airway, remains ABOVE vocal cords then ejected out Pharyngeal- Thin Teaspoon -- Pharyngeal -- Pharyngeal- Thin Cup Penetration/Aspiration during swallow Pharyngeal Material enters airway, remains ABOVE vocal cords then ejected out Pharyngeal- Thin Straw Penetration/Aspiration during swallow Pharyngeal Material enters airway, remains ABOVE vocal cords then ejected out Pharyngeal- Puree Delayed swallow initiation-vallecula;WFL Pharyngeal Material does not enter airway Pharyngeal- Mechanical Soft WFL;Delayed swallow initiation-vallecula Pharyngeal Material does not enter airway Pharyngeal- Regular -- Pharyngeal -- Pharyngeal- Multi-consistency -- Pharyngeal -- Pharyngeal- Pill -- Pharyngeal -- Pharyngeal Comment --  CHL IP CERVICAL ESOPHAGEAL PHASE 08/01/2021 Cervical Esophageal Phase Impaired Pudding Teaspoon -- Pudding Cup -- Honey Teaspoon -- Honey Cup -- Nectar Teaspoon -- Nectar Cup -- Nectar Straw -- Thin Teaspoon -- Thin Cup -- Thin Straw -- Puree -- Mechanical Soft -- Regular -- Multi-consistency -- Pill -- Cervical Esophageal Comment appearance of inconsistent prominent CP, did not impair barium flow Kathleen Lime, MS [facility] SLP Acute Rehab Services Office [phone] Pager [phone] Macario Golds 08/01/2021, 5:44 PM                   Scheduled Meds:  sodium chloride   Intravenous Once   acidophilus   Oral Daily   arformoterol  15 mcg Nebulization BID   atorvastatin  40 mg Oral Daily   azithromycin  500 mg Oral Daily   budesonide (PULMICORT) nebulizer solution  0.5 mg Nebulization BID   carbidopa-levodopa  1 tablet Oral 3 times per day   chlorhexidine  15 mL Mouth Rinse BID   Chlorhexidine Gluconate Cloth  6 each Topical Daily   dofetilide  500 mcg Oral BID   fenofibrate  160 mg Oral Daily   fluticasone  2 spray Each Nare Daily   folic acid  1 mg Oral Daily   furosemide  40 mg Intravenous Q12H   guaiFENesin  1,200 mg Oral  BID   ipratropium  1-2 spray Each Nare Q12H   ipratropium  0.5 mg Nebulization TID   levalbuterol  0.63 mg Nebulization TID   magnesium oxide  400 mg Oral Daily   mouth rinse  15 mL Mouth Rinse q12n4p   metoprolol succinate  25 mg Oral Daily   multivitamin with minerals  1 tablet Oral Daily   pantoprazole  40 mg Oral Daily   [START ON 08/03/2021] potassium chloride  10 mEq Oral Daily   rivaroxaban  20 mg Oral Q supper   tamsulosin  0.4 mg Oral QPC supper   topiramate  100 mg Oral BID   Continuous Infusions:  cefTRIAXone (ROCEPHIN)  IV 2 g (08/02/21 1014)   fluconazole (DIFLUCAN) IV 200 mg (08/02/21 1149)   metronidazole       LOS: 4 days    Time spent: 40 minutes    Irine Seal, MD [facility]   To contact the attending provider between 7A-7P or the covering provider during after hours 7P-7A, please log into the web site [email or web address] and access using universal [place] password for that web site. If you do not have the password, please call the hospital operator.  08/02/2021, 3:57 PM

## 2021-08-02 NOTE — Progress Notes (Signed)
Vest on hold at pt request. Pt getting blood at this time. Pt requested to resume vest at 20:00 rounds.

## 2021-08-02 NOTE — Progress Notes (Signed)
ADM: CELLULITIS OF LEFT LEG HOME REGIMEN: ALBUTEROL INHALER Q4 PRN ASSESSMENT: NO WOB, SOME SOB, SATS ON 4 LPM HF 96%, BS -DIM, VITALS NORMAL, POSS ASP PNA X-RAY: 08/01/21 GROUND GLASS OPACITIES ON CT WITH MILD INTERVAL PROGRESSION. 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  CURRENT THERAPY: BROVANA BID, PULIMCORT BID, XOP/ATR TID, CPT TID, CPT Q4 WA, CPAP QHS, O2, FLUTTER PRN PLAN: BROVANA BID, PULIMCORT BID, XOP/ATR TID, CPT TID, FLUTTER PRN, CPAP QHS, O2, FLUTTER PRN RT D/C CPT Q4 WA DUE TO PT CONDITION. RT WILL LEAVE CPT TID DUE TO LEFT LUNG. DR THOMPSON HAS ORDER VEST X2 ON PT.

## 2021-08-03 ENCOUNTER — Telehealth: Payer: Self-pay | Admitting: Medical Oncology

## 2021-08-03 ENCOUNTER — Inpatient Hospital Stay (HOSPITAL_COMMUNITY): Payer: Medicare PPO

## 2021-08-03 DIAGNOSIS — J189 Pneumonia, unspecified organism: Secondary | ICD-10-CM | POA: Diagnosis not present

## 2021-08-03 DIAGNOSIS — I10 Essential (primary) hypertension: Secondary | ICD-10-CM | POA: Diagnosis not present

## 2021-08-03 DIAGNOSIS — I480 Paroxysmal atrial fibrillation: Secondary | ICD-10-CM | POA: Diagnosis not present

## 2021-08-03 DIAGNOSIS — J9601 Acute respiratory failure with hypoxia: Secondary | ICD-10-CM | POA: Diagnosis not present

## 2021-08-03 DIAGNOSIS — L03116 Cellulitis of left lower limb: Secondary | ICD-10-CM | POA: Diagnosis not present

## 2021-08-03 LAB — BASIC METABOLIC PANEL
Anion gap: 7 (ref 5–15)
BUN: 12 mg/dL (ref 8–23)
CO2: 25 mmol/L (ref 22–32)
Calcium: 8.3 mg/dL — ABNORMAL LOW (ref 8.9–10.3)
Chloride: 97 mmol/L — ABNORMAL LOW (ref 98–111)
Creatinine, Ser: 0.63 mg/dL (ref 0.61–1.24)
GFR, Estimated: >60 mL/min (ref >60)
Glucose, Bld: 106 mg/dL — ABNORMAL HIGH (ref 70–99)
Potassium: 3.6 mmol/L (ref 3.5–5.1)
Sodium: 129 mmol/L — ABNORMAL LOW (ref 135–145)

## 2021-08-03 LAB — CBC WITH DIFFERENTIAL/PLATELET
Abs Immature Granulocytes: 0.07 10*3/uL (ref 0.00–0.07)
Basophils Absolute: 0 10*3/uL (ref 0.0–0.1)
Basophils Relative: 0 %
Eosinophils Absolute: 0.1 10*3/uL (ref 0.0–0.5)
Eosinophils Relative: 1 %
HCT: 26.7 % — ABNORMAL LOW (ref 39.0–52.0)
Hemoglobin: 8.9 g/dL — ABNORMAL LOW (ref 13.0–17.0)
Immature Granulocytes: 1 %
Lymphocytes Relative: 2 %
Lymphs Abs: 0.2 10*3/uL — ABNORMAL LOW (ref 0.7–4.0)
MCH: 31.3 pg (ref 26.0–34.0)
MCHC: 33.3 g/dL (ref 30.0–36.0)
MCV: 94 fL (ref 80.0–100.0)
Monocytes Absolute: 0.6 10*3/uL (ref 0.1–1.0)
Monocytes Relative: 10 %
Neutro Abs: 5.3 10*3/uL (ref 1.7–7.7)
Neutrophils Relative %: 86 %
Platelets: 148 10*3/uL — ABNORMAL LOW (ref 150–400)
RBC: 2.84 MIL/uL — ABNORMAL LOW (ref 4.22–5.81)
RDW: 17.7 % — ABNORMAL HIGH (ref 11.5–15.5)
WBC: 6.2 10*3/uL (ref 4.0–10.5)
nRBC: 0 % (ref 0.0–0.2)

## 2021-08-03 LAB — BPAM RBC
Blood Product Expiration Date: 202211292359
Blood Product Expiration Date: 202211292359
ISSUE DATE / TIME: 202210301401
ISSUE DATE / TIME: 202210301747
Unit Type and Rh: 5100
Unit Type and Rh: 5100

## 2021-08-03 LAB — TYPE AND SCREEN
ABO/RH(D): O POS
Antibody Screen: NEGATIVE
Unit division: 0
Unit division: 0

## 2021-08-03 LAB — BRAIN NATRIURETIC PEPTIDE: B Natriuretic Peptide: 34.3 pg/mL (ref 0.0–100.0)

## 2021-08-03 LAB — MAGNESIUM: Magnesium: 1.7 mg/dL (ref 1.7–2.4)

## 2021-08-03 MED ORDER — MAGNESIUM SULFATE 2 GM/50ML IV SOLN
2.0000 g | Freq: Once | INTRAVENOUS | Status: AC
  Administered 2021-08-03: 2 g via INTRAVENOUS
  Filled 2021-08-03: qty 50

## 2021-08-03 MED ORDER — FLUCONAZOLE 100 MG PO TABS
200.0000 mg | ORAL_TABLET | Freq: Every day | ORAL | Status: DC
  Administered 2021-08-03 – 2021-08-04 (×2): 200 mg via ORAL
  Filled 2021-08-03 (×2): qty 2

## 2021-08-03 MED ORDER — MAGNESIUM SULFATE 4 GM/100ML IV SOLN
4.0000 g | Freq: Once | INTRAVENOUS | Status: AC
  Administered 2021-08-03: 4 g via INTRAVENOUS
  Filled 2021-08-03: qty 100

## 2021-08-03 MED ORDER — FUROSEMIDE 10 MG/ML IJ SOLN
20.0000 mg | Freq: Once | INTRAMUSCULAR | Status: AC
  Administered 2021-08-03: 20 mg via INTRAVENOUS
  Filled 2021-08-03: qty 2

## 2021-08-03 MED ORDER — POTASSIUM CHLORIDE CRYS ER 10 MEQ PO TBCR
30.0000 meq | EXTENDED_RELEASE_TABLET | ORAL | Status: AC
  Administered 2021-08-03 (×2): 30 meq via ORAL
  Filled 2021-08-03 (×2): qty 3

## 2021-08-03 NOTE — PMR Pre-admission (Signed)
PMR Admission Coordinator Pre-Admission Assessment   Patient: Corey [name] is an 75 y.o., male MRN: [id number] DOB: 08/03/1946 Height: 6' 1.5" (186.7 cm) Weight: 102.8 kg   Insurance Information HMO:     PPO: yes     PCP:      IPA:      80/20: yes     OTHER:  PRIMARY: [employer]       Policy#: [id number]      Subscriber: Pt.  CM Name: Pat      Phone#: [phone]     Fax#: [phone] I received a call from Casey Copeland at [employer] on 08/11/21 granting auth for CIR from 08/11/21 for 7 days  with 5 days to admit.  Pre-Cert#: [id number]      Employer: na Benefits:  Phone #:      Name:  Eff Date: 10/05/2019- still active Deductible: does not have OOP Max: $3,300 ($3,300 met) CIR: $125/day co-pay with a max co-pay of $1,250/admission (10 days) SNF: 100% coverage Outpatient:  $20/visit co-pay Home Health:  100% coverage DME: 80% coverage; 20% co-insurance Providers: in network  SECONDARY:       Policy#:      Phone#:    Financial Counselor:       Phone#:    The "Data Collection Information Summary" for patients in Inpatient Rehabilitation Facilities with attached "Privacy Act Statement-Health Care Records" was provided and verbally reviewed with: Patient and Family   Emergency Contact Information Contact Information       Name Relation Home Work Mobile    Ebarb,Renee B Spouse [phone]    Karren,Jason Son [phone]   [phone]    Durio,Meredith Daughter [phone]   [phone]           Current Medical History  Patient Admitting Diagnosis: Debility, Cellulitis, Pneumonia History of Present Illness: Corey [name] is a 75 y.o. male with medical history significant of NSCLC on chemo, OSA, atrial fib, CAD. Presenting with LLE swelling and generalized weakness. He reports that over the last week he has noticed erythema and swelling starting in his left ankle and progressing upwards. He thought at first it was just dry,  irritated skin. So he tried some lotion, but it didn't help. He hasn't had any drainage in the area. He hasn't noticed fever. He's noticed that he's been progressively weaker during this time. He notes that he was recently on lvq for a PNA. His regimen stopped the day before his erythema started. In addition, his cough and shortness of breath from his PNA returned. He became concerned when his symptoms didn't improve yesterday. So, he came to the ED for help. CXR showed an unchanged LLL opacity. No fevers recored. WBC normal. Noted to have LLLE cellulitis. Margins marked. He was started on vanc, cefepime. Pt. With anemia during admission, received PRBCs 08/02/21. CIR consulted to assist return to PLOF.      Patient's medical record from [facility] has been reviewed by the rehabilitation admission coordinator and physician.   Past Medical History      Past Medical History:  Diagnosis Date   Anemia     Anxiety     Arthritis      "back, right knee, hands, ankles, neck" (05/31/2016)   Carotid artery disease (HCC) 08/30/2016    Carotid US 11/19: R 1-39, L 40-59 // Carotid US 08/2019: R 1-39; L 40-59 // Carotid US 11/21: R 1-39; L 40-59>> repeat 1 year      Chronic lower back pain     Coronary atherosclerosis of native coronary artery      a. BMS to mRCA 2004 and 2007, otherwise mild nonobstructive disease. EF normal.   Diverticulitis     Dyslipidemia     Dyspnea     Essential hypertension, benign     Family history of breast cancer     Family history of lung cancer     Family history of prostate cancer     GERD (gastroesophageal reflux disease)     Lumbar radiculopathy, chronic 02/04/2015    Right L5   Obesity     OSA on CPAP      uses cpap   Paroxysmal atrial fibrillation (HCC)      a. Discovered after stroke.   Pneumonia 01/1996   PONV (postoperative nausea and vomiting)     Prostate CA (HCC)     Recurrent upper respiratory infection (URI)     Stroke (HCC) 07/2012     no deficits   Visit for monitoring Tikosyn therapy 09/18/2019      Has the patient had major surgery during 100 days prior to admission? No   Family History   family history includes Blindness in his son; Breast cancer in his paternal grandmother; Heart attack in his father and paternal grandfather; Heart disease in his father; Hypertension in his mother; Lung cancer in his paternal aunt; Parkinson's disease in his brother; Prostate cancer (age of onset: 70) in his brother.   Current Medications   Current Facility-Administered Medications:    acetaminophen (TYLENOL) tablet 650 mg, 650 mg, Oral, Q6H PRN, 650 mg at 07/29/21 0606 **OR** acetaminophen (TYLENOL) suppository 650 mg, 650 mg, Rectal, Q6H PRN, Kyle, Tyrone A, DO   acetaminophen (TYLENOL) tablet 1,000 mg, 1,000 mg, Oral, Q8H PRN, Kyle, Tyrone A, DO   acidophilus (RISAQUAD) capsule, , Oral, Daily, Kyle, Tyrone A, DO, 1 capsule at 08/03/21 1030   albuterol (PROVENTIL) (2.5 MG/3ML) 0.083% nebulizer solution 3 mL, 3 mL, Inhalation, Q4H PRN, Kyle, Tyrone A, DO   ALPRAZolam (XANAX) tablet 0.5 mg, 0.5 mg, Oral, QHS PRN, Kyle, Tyrone A, DO, 0.5 mg at 08/02/21 2127   alum & mag hydroxide-simeth (MAALOX/MYLANTA) 200-200-20 MG/5ML suspension 30 mL, 30 mL, Oral, Q4H PRN, Thompson, Daniel V, MD, 30 mL at 07/31/21 0930   arformoterol (BROVANA) nebulizer solution 15 mcg, 15 mcg, Nebulization, BID, 15 mcg at 08/03/21 0743 **AND** [DISCONTINUED] umeclidinium bromide (INCRUSE ELLIPTA) 62.5 MCG/ACT 1 puff, 1 puff, Inhalation, Daily, Kyle, Tyrone A, DO, 1 puff at 07/31/21 0735   atorvastatin (LIPITOR) tablet 40 mg, 40 mg, Oral, Daily, Kyle, Tyrone A, DO, 40 mg at 08/03/21 1032   benzonatate (TESSALON) capsule 200 mg, 200 mg, Oral, TID PRN, Kyle, Tyrone A, DO, 200 mg at 08/01/21 0850   budesonide (PULMICORT) nebulizer solution 0.5 mg, 0.5 mg, Nebulization, BID, Thompson, Daniel V, MD, 0.5 mg at 08/03/21 0743   carbidopa-levodopa (SINEMET IR) 25-100 MG per  tablet immediate release 1 tablet, 1 tablet, Oral, 3 times per day, Kyle, Tyrone A, DO, 1 tablet at 08/03/21 1253   cefTRIAXone (ROCEPHIN) 2 g in sodium chloride 0.9 % 100 mL IVPB, 2 g, Intravenous, Q24H, Kyle, Tyrone A, DO, Last Rate: 200 mL/hr at 08/02/21 1014, 2 g at 08/02/21 1014   chlorhexidine (PERIDEX) 0.12 % solution 15 mL, 15 mL, Mouth Rinse, BID, Kyle, Tyrone A, DO, 15 mL at 08/03/21 0950   Chlorhexidine Gluconate Cloth 2 % PADS 6 each, 6 each, Topical, Daily, Kyle,   Tyrone A, DO, 6 each at 08/03/21 0950   chlorpheniramine-HYDROcodone (TUSSIONEX) 10-8 MG/5ML suspension 5 mL, 5 mL, Oral, Q12H PRN, Kyle, Tyrone A, DO, 5 mL at 08/01/21 0148   dofetilide (TIKOSYN) capsule 500 mcg, 500 mcg, Oral, BID, Kyle, Tyrone A, DO, 500 mcg at 08/03/21 1030   fenofibrate tablet 160 mg, 160 mg, Oral, Daily, Kyle, Tyrone A, DO, 160 mg at 08/03/21 1029   fluconazole (DIFLUCAN) tablet 200 mg, 200 mg, Oral, Daily, Bell, Michelle T, RPH, 200 mg at 08/03/21 1031   fluticasone (FLONASE) 50 MCG/ACT nasal spray 2 spray, 2 spray, Each Nare, Daily, Kyle, Tyrone A, DO, 2 spray at 08/03/21 1043   folic acid (FOLVITE) tablet 1 mg, 1 mg, Oral, Daily, Bell, Michelle T, RPH, 1 mg at 08/03/21 1029   guaiFENesin (MUCINEX) 12 hr tablet 1,200 mg, 1,200 mg, Oral, BID, Thompson, Daniel V, MD, 1,200 mg at 08/03/21 1031   HYDROcodone-acetaminophen (NORCO/VICODIN) 5-325 MG per tablet 1-2 tablet, 1-2 tablet, Oral, Q6H PRN, Kyle, Tyrone A, DO, 1 tablet at 07/28/21 2254   ipratropium (ATROVENT) 0.03 % nasal spray 1-2 spray, 1-2 spray, Each Nare, Q12H, Thompson, Daniel V, MD, 1 spray at 08/03/21 1042   ipratropium (ATROVENT) nebulizer solution 0.5 mg, 0.5 mg, Nebulization, TID, Mohamed, Mohamed, MD, 0.5 mg at 08/03/21 0743   levalbuterol (XOPENEX) nebulizer solution 0.63 mg, 0.63 mg, Nebulization, TID, Mohamed, Mohamed, MD, 0.63 mg at 08/03/21 0743   loperamide (IMODIUM) capsule 2 mg, 2 mg, Oral, Daily PRN, Thompson, Daniel V, MD    loratadine (CLARITIN) tablet 10 mg, 10 mg, Oral, Daily PRN, Bell, Michelle T, RPH, 10 mg at 08/01/21 0850   magnesium oxide (MAG-OX) tablet 400 mg, 400 mg, Oral, Daily, Thompson, Daniel V, MD, 400 mg at 08/03/21 1032   [COMPLETED] magnesium sulfate IVPB 4 g 100 mL, 4 g, Intravenous, Once, Last Rate: 50 mL/hr at 08/03/21 1047, 4 g at 08/03/21 1047 **FOLLOWED BY** magnesium sulfate IVPB 2 g 50 mL, 2 g, Intravenous, Once, Thompson, Daniel V, MD, Last Rate: 50 mL/hr at 08/03/21 1256, 2 g at 08/03/21 1256   MEDLINE mouth rinse, 15 mL, Mouth Rinse, q12n4p, Kyle, Tyrone A, DO, 15 mL at 08/03/21 1218   metoprolol succinate (TOPROL-XL) 24 hr tablet 25 mg, 25 mg, Oral, Daily, Thompson, Daniel V, MD, 25 mg at 08/03/21 1032   metroNIDAZOLE (FLAGYL) IVPB 500 mg, 500 mg, Intravenous, Q12H, Thompson, Daniel V, MD, Last Rate: 100 mL/hr at 08/03/21 1048, 500 mg at 08/03/21 1048   multivitamin with minerals tablet 1 tablet, 1 tablet, Oral, Daily, Kyle, Tyrone A, DO, 1 tablet at 08/03/21 1033   ondansetron (ZOFRAN) tablet 4 mg, 4 mg, Oral, Q6H PRN **OR** ondansetron (ZOFRAN) injection 4 mg, 4 mg, Intravenous, Q6H PRN, Kyle, Tyrone A, DO, 4 mg at 07/30/21 1401   pantoprazole (PROTONIX) EC tablet 40 mg, 40 mg, Oral, Daily, Bell, Michelle T, RPH, 40 mg at 08/03/21 1032   potassium chloride (KLOR-CON) CR tablet 10 mEq, 10 mEq, Oral, Daily, Thompson, Daniel V, MD   prochlorperazine (COMPAZINE) injection 10 mg, 10 mg, Intravenous, Q6H PRN, Thompson, Daniel V, MD, 10 mg at 07/30/21 0914   promethazine (PHENERGAN) tablet 25 mg, 25 mg, Oral, Q6H PRN, Kyle, Tyrone A, DO, 25 mg at 07/29/21 1837   rivaroxaban (XARELTO) tablet 20 mg, 20 mg, Oral, Q supper, Thompson, Daniel V, MD, 20 mg at 08/02/21 1617   sodium chloride flush (NS) 0.9 % injection 10-40 mL, 10-40 mL, Intracatheter, PRN, Kyle, Tyrone A,   DO   tamsulosin (FLOMAX) capsule 0.4 mg, 0.4 mg, Oral, QPC supper, Kyle, Tyrone A, DO, 0.4 mg at 08/02/21 1617   topiramate (TOPAMAX)  tablet 100 mg, 100 mg, Oral, BID, Kyle, Tyrone A, DO, 100 mg at 08/03/21 1030   traMADol (ULTRAM) tablet 50 mg, 50 mg, Oral, Q6H PRN, Kyle, Tyrone A, DO   traZODone (DESYREL) tablet 25 mg, 25 mg, Oral, QHS PRN, Kyle, Tyrone A, DO, 25 mg at 08/02/21 2128   Patients Current Diet:  Diet Order                  DIET DYS 3 Room service appropriate? Yes; Fluid consistency: Thin  Diet effective now                         Precautions / Restrictions Precautions Precautions: Fall Precaution Comments: CHEMO Restrictions Weight Bearing Restrictions: No    Has the patient had 2 or more falls or a fall with injury in the past year? No   Prior Activity Level Limited Community (1-2x/wk): Pt. wenet out for appointments   Prior Functional Level Self Care: Did the patient need help bathing, dressing, using the toilet or eating? Needed some help   Indoor Mobility: Did the patient need assistance with walking from room to room (with or without device)? Needed some help   Stairs: Did the patient need assistance with internal or external stairs (with or without device)? Needed some help   Functional Cognition: Did the patient need help planning regular tasks such as shopping or remembering to take medications? Needed some help   Patient Information Are you of Hispanic, Latino/a,or Spanish origin?: A. No, not of Hispanic, Latino/a, or Spanish origin What is your race?: A. White Do you need or want an interpreter to communicate with a doctor or health care staff?: 0. No   Patient's Response To:  Health Literacy and Transportation Is the patient able to respond to health literacy and transportation needs?: Yes Health Literacy - How often do you need to have someone help you when you read instructions, pamphlets, or other written material from your doctor or pharmacy?: Sometimes In the past 12 months, has lack of transportation kept you from medical appointments or from getting medications?:  No In the past 12 months, has lack of transportation kept you from meetings, work, or from getting things needed for daily living?: No   Home Assistive Devices / Equipment Home Assistive Devices/Equipment: Eyeglasses, CPAP, Walker (specify type), Other (Comment) (four wheel walker, rollator, transfer chair) Home Equipment: Rollator (4 wheels), Cane - single point, Shower seat, Rolling Walker (2 wheels), Hand held shower head, Transport chair, Adaptive equipment   Prior Device Use: Indicate devices/aids used by the patient prior to current illness, exacerbation or injury? Walker   Current Functional Level Cognition   Overall Cognitive Status: Within Functional Limits for tasks assessed Orientation Level: Oriented X4 General Comments: AxO x 3 very pleasant but feels real weak. brother was also in room on this date    Extremity Assessment (includes Sensation/Coordination)   Upper Extremity Assessment: Defer to OT evaluation  Lower Extremity Assessment: Generalized weakness     ADLs   Overall ADL's : Needs assistance/impaired Eating/Feeding: Modified independent, Sitting Eating/Feeding Details (indicate cue type and reason): Increased effort due to BUE tremor, but no assist needed Grooming: Wash/dry hands, Sitting, Set up, Oral care, Wash/dry face Grooming Details (indicate cue type and reason): patient was noted to cough up some   red/green colored phlem after brushing teeth. nurse was in room and to contact MD. patient was ntoed toh ave increaed SOB with coughing with O2 dropping to 82% on RA with patient having removed nasal cannula to wash face. O2 was returned with O2 rising to 93% with eduaction on deep breathing. Upper Body Bathing: Set up, Sitting Upper Body Bathing Details (indicate cue type and reason): patient declined to wash up stating he had done that previous night Lower Body Bathing: Minimal assistance, Sitting/lateral leans Upper Body Dressing : Set up, Sitting Lower Body  Dressing: With adaptive equipment, Minimal assistance, Sit to/from stand, Sitting/lateral leans Toilet Transfer: BSC, Rolling walker (2 wheels), Stand-pivot, Minimal assistance Toilet Transfer Details (indicate cue type and reason): Cues for hand placement. Increased effort. Toileting- Clothing Manipulation and Hygiene: Moderate assistance, Sit to/from stand, Sitting/lateral lean Toileting - Clothing Manipulation Details (indicate cue type and reason): Pt able to perform posterior peri care using orward lean on BSC. Required Mod Assist to completed thorough hygiene while standing and holding RW. Functional mobility during ADLs: Minimal assistance, Rolling walker (2 wheels) General ADL Comments: patient required max A x 2 to transfer from edge of bed to recliner with patient reporting increased dizziness at this time. nursing aware. patients HR was noted to elevate to 124 bpm with transfer. patients vitals seated in recliner were126/77 HR was 114.     Mobility   Overal bed mobility: Needs Assistance Bed Mobility: Supine to Sit Supine to sit: Max assist, Total assist Sit to supine: Mod assist, HOB elevated General bed mobility comments: pt required increased assist to transfer from supine to sidelying to EOB with HOB elevated and use of rail.  Pt < 10% self able due to profound weakness.     Transfers   Overall transfer level: Needs assistance Equipment used: Rolling walker (2 wheels), None Transfers: Sit to/from Stand, Stand Pivot Transfers Sit to Stand: Mod assist, +2 physical assistance, +2 safety/equipment Stand pivot transfers: Total assist, +2 physical assistance, +2 safety/equipment General transfer comment: pt required + 2 side by side assist with inablity to push self up so allowed himw to pull up using walker.  MAX c/o weakness and c/o increased dizziness.  BP standing 107/57 with HR 114 and sats decreased from 95% at rest to 83% remeining on 6 lts.  Pr performed 2 brief sit to stands.   Assisted to recliner.     Ambulation / Gait / Stairs / Wheelchair Mobility   Ambulation/Gait General Gait Details: not able to attempt due to limited activity tolerance during transfer     Posture / Balance Dynamic Sitting Balance Sitting balance - Comments: Tremulous Balance Overall balance assessment: Needs assistance Sitting balance-Leahy Scale: Fair Sitting balance - Comments: Tremulous Standing balance support: Bilateral upper extremity supported Standing balance-Leahy Scale: Poor Standing balance comment: Requires BUE support on RW     Special needs/care consideration Skin Cellulitis on LLE and Special service needs n/a    Previous Home Environment (from acute therapy documentation) Living Arrangements: Spouse/significant other  Lives With: Spouse Available Help at Discharge: Family, Available PRN/intermittently, Friend(s) (spouse works outside the home) Type of Home: House Home Layout: Two level, Bed/bath upstairs, 1/2 bath on main level Alternate Level Stairs-Rails: Right, Left (B rails on first half of steps, then R rail only the rest of the way up) Alternate Level Stairs-Number of Steps: flight Home Access: Stairs to enter Entrance Stairs-Rails: None Entrance Stairs-Number of Steps: 2 Bathroom Shower/Tub: Tub/shower unit Bathroom Toilet: Handicapped height Bathroom   Accessibility: No Home Care Services: Yes Type of Home Care Services: Home PT Home Care Agency (if known): [facility] on hold too weak to do anything Additional Comments: Pt no longer with Life Alert system   Discharge Living Setting Plans for Discharge Living Setting: Patient's home Type of Home at Discharge: House Discharge Home Layout: Two level, 1/2 bath on main level Alternate Level Stairs-Rails: Right, Left Alternate Level Stairs-Number of Steps: flight Discharge Home Access: Stairs to enter Entrance Stairs-Rails: None Entrance Stairs-Number of Steps: 2 Discharge Bathroom Shower/Tub: Tub/shower  unit Discharge Bathroom Toilet: Handicapped height Discharge Bathroom Accessibility: Yes How Accessible: Accessible via walker Does the patient have any problems obtaining your medications?: No   Social/Family/Support Systems Patient Roles: Spouse Contact Information: [phone] Anticipated Caregiver: Renee Mace Anticipated Caregiver's Contact Information: [phone] Ability/Limitations of Caregiver: Can provide min A Caregiver Availability: 24/7 Discharge Plan Discussed with Primary Caregiver: Yes Is Caregiver In Agreement with Plan?: Yes Does Caregiver/Family have Issues with Lodging/Transportation while Pt is in Rehab?: No   Goals Patient/Family Goal for Rehab: PT/OT MIn A Expected length of stay: 18-22 days Pt/Family Agrees to Admission and willing to participate: Yes Program Orientation Provided & Reviewed with Pt/Caregiver Including Roles  & Responsibilities: Yes   Decrease burden of Care through IP rehab admission: Decrease number of caregivers and Patient/family education   Possible need for SNF placement upon discharge: not anticipated    Patient Condition: I have reviewed medical records from [facility], spoken with CM, and patient and spouse. I discussed via phone for inpatient rehabilitation assessment.  Patient will benefit from ongoing PT and OT, can actively participate in 3 hours of therapy a day 5 days of the week, and can make measurable gains during the admission.  Patient will also benefit from the coordinated team approach during an Inpatient Acute Rehabilitation admission.  The patient will receive intensive therapy as well as Rehabilitation physician, nursing, social worker, and care management interventions.  Due to safety, skin/wound care, disease management, medication administration, pain management, and patient education the patient requires 24 hour a day rehabilitation nursing.  The patient is currently min g- mod +2 with mobility and  basic ADLs.  Discharge setting and therapy post discharge at home with home health is anticipated.  Patient has agreed to participate in the Acute Inpatient Rehabilitation Program and will admit today.   Preadmission Screen Completed By:  Laura B Staley, 08/03/2021 1:54 PM ______________________________________________________________________   Discussed status with Dr. Kirin Brandenburger  on 08/12/21 at 930 and received approval for admission today.   Admission Coordinator:  Laura B Staley, CCC-SLP, time 1030/Date 08/12/21    Assessment/Plan: Diagnosis: Debility secondary to left leg cellulitis Does the need for close, 24 hr/day Medical supervision in concert with the patient's rehab needs make it unreasonable for this patient to be served in a less intensive setting? Yes Co-Morbidities requiring supervision/potential complications: overweight BMI 27.60, chronic benign essential hypertension, GERD, paroxysmal atrial fibrilliation, nausea and vomiting Due to bladder management, bowel management, safety, skin/wound care, disease management, medication administration, pain management, and patient education, does the patient require 24 hr/day rehab nursing? Yes Does the patient require coordinated care of a physician, rehab nurse, PT, OT to address physical and functional deficits in the context of the above medical diagnosis(es)? Yes Addressing deficits in the following areas: balance, endurance, locomotion, strength, transferring, bowel/bladder control, bathing, dressing, feeding, grooming, toileting, and psychosocial support Can the patient actively participate in an intensive therapy program of at least 3 hrs

## 2021-08-03 NOTE — Progress Notes (Signed)
Pt refused CPT this round.  Pt states that it hurts his access port.   Pt moving better air and less rhonchi than yesterday.

## 2021-08-03 NOTE — Progress Notes (Signed)
Inpatient Rehab Admissions Coordinator:    I do not have a bed for this Pt. On CIR for this Pt. Today. I will open a case with Pt.'s insurance and pursue for potential admit pending medical readiness, insurance auth, and bed availability.  Clemens Catholic, [place], [place] Admissions Coordinator  [phone] ([place]) [phone] (office)

## 2021-08-03 NOTE — Consult Note (Signed)
[facility] CM Inpatient Consult   08/03/2021  Corey [name] January 28, 1946 [id number]  Patient chart has been reviewed due to high risk score for unplanned readmissions.  Patient assessed for community [place] Management follow up needs.  Chart review reveals patient anticipated discharge will be for inpatient rehab. No [facility] Care Management needs at this time.   Of note, [facility] Care Management services does not replace or interfere with any services that are arranged by inpatient case management or social work.   Netta Cedars, MSN, RN [facility] [employer] [phone]  Toll free office [phone]

## 2021-08-03 NOTE — Progress Notes (Signed)
PT sleeping during 1400 scheduled visit- family member states he is sleeping for first time in a while. CPT not completed at this time.

## 2021-08-03 NOTE — Progress Notes (Signed)
PROGRESS NOTE    Corey [name]  TFT:[id number] DOB: 22-Feb-1946 DOA: 07/28/2021 PCP: [name], MD   Chief Complaint  Patient presents with   Fatigue   Shortness of Breath    Brief Narrative:  Patient pleasant 75 year old gentleman history of non-small cell lung cancer on chemotherapy, OSA, A. fib, coronary artery disease presented with left lower extremity swelling and generalized weakness.  Patient noted to have recently been treated with Levaquin for pneumonia which she completed the day prior to his erythema.  Patient presented with shortness of breath from pneumonia.  Chest x-ray done showed unchanged left lower lobe opacity, no fevers recorded, patient on also noted with a left lower extremity cellulitis.  Patient placed on IV vancomycin cefepime and admitted for further evaluation and management.  Patient noted the night of admission 12 intractable nausea and vomiting and abdominal films and chest x-ray ordered.   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 -Was on IV vancomycin and IV Rocephin.   -IV vancomycin discontinued.   -Blood cultures with no growth to date.   -IV Rocephin.   -Supportive care.  2.  Acute respiratory failure with hypoxia likely secondary to  aspiration pneumonia -Patient noted to have worsening respiratory status with increased O2 requirements the evening of 07/31/2021. -It is noted that patient had a sats of 77% on room air the evening on 07/31/2021, patient noted to have required up to 8 L high flow nasal cannula on 08/01/2021.   -BNP at 24.3.  -ABG with a pH of 7.4/PCO2 of 32/PO2 of 114/bicarb of 20.   -Patient placed on Lasix 40 mg IV every 12 hours with urine output of 2.5 L over the past 24 hours. -Some initial clinical improvement on 08/02/2021 however patient with worsening hypoxia 08/03/2021 with sats of 95% on 6 L nasal cannula laying supine, when patient moved to edge of bed sats of 83% on 6 L with some complaints of dizziness.  -Sputum gram stain and culture with rare Candida albicans, very thin strip. -Continue IV Rocephin, IV Flagyl, Diflucan.   -Patient will complete a 5-day course of azithromycin today.  -Continue scheduled Xopenex and Atrovent nebs. -Supportive care. -Case discussed with critical care, Dr. Lamonte Sakai who reviewed patient's chest x-rays, CT scans and is in agreement with current medical management.  If patient's condition was to worsen and hypoxia worsens we will get a formal PCCM consultation. -Formal PCCM consultation pending.  3.  Nausea vomiting -Patient noted to have intractable nausea vomiting the evening of 07/29/2021, morning of 07/30/2021 . -Patient placed on IV Compazine and IV Zofran with clinical improvement.  -Patient stated had a bowel movement.   -Improving clinically with no further nausea or vomiting and tolerating clear liquids. -Abdominal films  with nonobstructive bowel gas pattern, mild gaseous distention of the stomach.   -Chest x-ray with new faint patchy opacity right midlung, differential includes mild atelectasis, aspiration or pneumonia.  Stable volume loss and patchy consolidation in the mid to lower left lung compatible with posttreatment change. -Patient initially placed on  n.p.o. pending abdominal films, abdominal films with no obstructive pattern. -Patient placed on a full liquid diet which he tolerated, and has been advanced to a dysphagia 3 diet which he is currently tolerating.  -Patient being followed by SLP and underwent MBS.  -Supportive care.   4.  aspiration pneumonia/CAP versus BOOP.  -Patient noted with intractable nausea and vomiting the evening of 07/29/2021 and in the morning of 07/30/2021.  -Repeat chest x-ray done 08/01/2021 with patchy AICD in the right lung corresponds to groundglass opacities on CT, with mild interval progression.  Chronic volume loss in the left hemothorax with pleural parenchymal opacity in the left lower lung.  -MRSA PCR negative.   -Sputum gram stain and culture with rare Candida albicans, rare variant Streptococcus. -Discontinued IV vancomycin.   -Due to acute respiratory failure with hypoxia and worsening chest x-ray with concerns for aspiration pneumonia antibiotics was broadened with addition of IV Flagyl to IV Rocephin and IV azithromycin.   -Patient to complete course of azithromycin today.   -Patient with worsening ongoing hypoxia with sats in the 83% on 6 L nasal cannula with exertion as noted on PT note.   -Repeat chest x-ray.  -Continue Mucinex, Brovana, Flonase, Diflucan.  -We will add Brovana, Pulmicort. -?  Diuretics however will await PCCM evaluation. -Due to worsening hypoxia will consult with PCCM for further evaluation and management.   5.  Hypokalemia/hypomagnesemia/hyponatremia -Improving with hydration. -Potassium at 3.6, magnesium at 1.7.   -Magnesium sulfate 2 g IV x1.   -Patient on Tikosyn and as such goal is to keep potassium around 4, magnesium at 2.   -Magnesium 6 g IV x1, Kdur 32meq x 1.  -Repeat labs in the morning.    6.  Stage IV non-small cell lung cancer -On palliative chemotherapy per oncology, Dr. Lorna Few. -Continue nebs/inhalers. -Patient's oncologist ordered staging CT  scans on 07/31/2021.   -Oncology informed of admission via epic. -Per oncology  7.  Paroxysmal atrial fibrillation -Patient with intractable nausea vomiting unable to tolerate any oral intake this morning. -Intractable nausea and vomiting improved/resolved.   -Patient transitioned from IV Lopressor to oral home dose Lopressor.  -Continue Tikosyn.   -Keep potassium approximately 4, keep magnesium approximately 2.   -Was on full dose Lovenox and transitioned back to home dose Xarelto.   -Supportive care.    8.  Hyperlipidemia -Lipitor, fenofibrate.   9.  Coronary artery disease/hypertension -IV metoprolol has been transition back to home dose oral metoprolol.  Continue statin, anticoagulation.   10.  Parkinson's -Sinemet  11.  Prolonged QT -Repeat EKG with resolution of QT prolongation currently at 467 from 530 on admission. -Magnesium currently at 1.7, potassium at 3.6.   -Magnesium sulfate 6 g IV x1.   -KDur 60 mEq x 1.  -Keep potassium approximately at 4, magnesium approximately 2. -Follow.  12.  OSA -Nausea and vomiting improved no further nausea and vomiting noted.   -CPAP nightly has been reordered.  13.  History of CVA -No overt bleeding.  -Continue Xarelto for secondary stroke prophylaxis.  -Statin.    14.  Generalized weakness -PT/OT. -PT recommending SNF however patient and family would prefer to go home with home  health therapies. -Patient assessed by CIR and evaluation pending to see whether patient is an appropriate candidate. -TOC consulted for home health therapies.  15.  Anemia -Likely secondary to chemotherapy. -Patient with no overt bleeding. -Hemoglobin at 7.0 on 08/02/2021. -Status post transfusion 2 units packed red blood cells 08/02/2021 with hemoglobin currently at 8.9. -Transfusion threshold hemoglobin < 7. -Follow H&H.   DVT prophylaxis: Xarelto.  Code Status: DNR Family Communication: Updated patient, updated brother at bedside.    Disposition:   Status is: Inpatient  Remains inpatient appropriate because: Inpatient treatment necessary.  Patient noted to have nausea and vomiting overnight with inability to keep anything down.       Consultants:  Oncology informed of admission via epic. PCCM pending.  Procedures:  Abdominal x-ray 07/30/2021 Chest x-ray 07/30/2021, 08/01/2021 Bilateral lower extremity Dopplers 07/30/2021 CT chest/CT abdomen and pelvis 07/31/2021 Transfused 2 units packed red blood cells 08/02/2021  Antimicrobials:  IV azithromycin 07/30/2021>>>>>> IV cefepime 1025 2022x1 dose IV Rocephin 07/29/2021>>>>> IV vancomycin 07/28/2021>>>>>> 07/31/2021 IV Flagyl 08/02/2021 IV Diflucan 08/02/2021    Subjective: Patient sitting up in bed.  Alert.  Denies any chest pain.  Still with increasing productive cough.  It is noted that when patient worked with physical therapy patient with significant hypoxia with sats of 95% while laying supine on 6 L nasal cannula and sats dropping to 83% on 6 L nasal cannula when patient at edge of bed with some complaints of dizziness per physical therapy's note.  Patient denies any further nausea or vomiting.  Tolerating current diet.  Objective: Vitals:   08/03/21 0617 08/03/21 0743 08/03/21 1032 08/03/21 1125  BP: 117/70  104/71 107/71  Pulse: (!) 104  (!) 107 (!) 102  Resp:    20  Temp: 98.1 F (36.7 C)   98.8 F (37.1 C)  TempSrc:    Oral  SpO2: 94% (!) 89%  97%  Weight: 102.8 kg     Height:        Intake/Output Summary (Last 24 hours) at 08/03/2021 1203 Last data filed at 08/03/2021 0719 Gross per 24 hour  Intake 535.72 ml  Output 3650 ml  Net -3114.28 ml    Filed Weights   07/29/21 0510 08/02/21 0531 08/03/21 0617  Weight: 102.1 kg 102.2 kg 102.8 kg    Examination:  General exam: NAD. Respiratory system: Decreased breath sounds in the left base.  Some scattered coarse breath sounds/rhonchi diffusely.  No wheezing noted.  Fair air  movement.  Cardiovascular system: Regular rate rhythm no murmurs rubs or gallops.  No JVD.  No lower extremity edema. Gastrointestinal system: Abdomen is soft, nontender, nondistended, positive bowel sounds.  No rebound.  No guarding. Central nervous system: Alert and oriented. No focal neurological deficits. Extremities: Left lower extremity with demarcated cellulitis with significant improvement.  No warmth noted.  Nontender to palpation. Skin: No rashes, lesions or ulcers Psychiatry: Judgement and insight appear normal. Mood & affect appropriate.     Data Reviewed: I have personally reviewed following labs and imaging studies  CBC: Recent Labs  Lab 07/29/21 0648 07/30/21 0500 07/31/21 0400 08/01/21 0457 08/02/21 0321 08/03/21 0315  WBC 7.0 8.9 7.8 6.4 6.3 6.2  NEUTROABS 5.9  --  6.7 5.4 5.3 5.3  HGB 8.1* 8.6* 7.3* 7.3* 7.0* 8.9*  HCT 25.1* 26.2* 23.2* 22.2* 21.6* 26.7*  MCV 96.2 94.9 97.5 96.5 96.0 94.0  PLT 73* 115* 128* 140* 146* 148*     Basic Metabolic Panel: Recent Labs  Lab 07/30/21  0500 07/31/21 0400 08/01/21 0457 08/02/21 0321 08/03/21 0315  NA 134* 133* 130* 129* 129*  K 3.5 4.1 3.9 3.4* 3.6  CL 101 103 104 99 97*  CO2 24 21* 21* 23 25  GLUCOSE 123* 90 91 94 106*  BUN 14 13 9 10 12   CREATININE 0.66 0.58* 0.64 0.61 0.63  CALCIUM 8.2* 8.1* 8.0* 8.1* 8.3*  MG 2.0 1.8 1.7 1.8 1.7  PHOS  --  2.3*  --   --   --      GFR: Estimated Creatinine Clearance: 102.9 mL/min (by C-G formula based on SCr of 0.63 mg/dL).  Liver Function Tests: Recent Labs  Lab 07/28/21 1109 07/29/21 0648 07/30/21 0500 07/31/21 0400  AST 27 27 38  --   ALT 16 15 14   --   ALKPHOS 56 49 54  --   BILITOT 0.8 0.8 0.8  --   PROT 6.8 6.4* 6.7  --   ALBUMIN 3.0* 2.2* 2.3* 1.8*     CBG: No results for input(s): GLUCAP in the last 168 hours.   Recent Results (from the past 240 hour(s))  Blood culture (routine single)     Status: None   Collection Time: 07/28/21 11:44 AM    Specimen: BLOOD LEFT ARM  Result Value Ref Range Status   Specimen Description   Final    BLOOD LEFT ARM Performed at [facility], [address] [place], [place] [address]    Special Requests   Final    Blood Culture adequate volume Performed at [facility], [address], [place], [place] [address]    Culture   Final    NO GROWTH 5 DAYS Performed at [facility], [place] [address], [place], [place] [address]    Report Status 08/02/2021 FINAL  Final  Culture, blood (routine x 2)     Status: None   Collection Time: 07/28/21  2:03 PM   Specimen: BLOOD  Result Value Ref Range Status   Specimen Description   Final    BLOOD CENTRAL LINE BLOOD RIGHT ARM Performed at [facility], [address] [place], [place] [address]    Special Requests   Final    BOTTLES DRAWN AEROBIC AND ANAEROBIC Blood Culture adequate volume Performed at [facility], [address] [place], [place] [address]    Culture   Final    NO GROWTH 5 DAYS Performed at [facility], [place] [address] [place],  [address]    Report Status 08/02/2021 FINAL  Final  Resp Panel by RT-PCR (Flu A&B, Covid) Nasopharyngeal Swab     Status: None   Collection Time: 07/28/21  3:35 PM   Specimen: Nasopharyngeal Swab; Nasopharyngeal(NP) swabs in vial transport medium  Result Value Ref Range Status   SARS Coronavirus 2 by RT PCR NEGATIVE NEGATIVE Final    Comment: (NOTE) SARS-CoV-2 target nucleic acids are NOT DETECTED.  The SARS-CoV-2 RNA is generally detectable in upper respiratory specimens during the acute phase of infection. The lowest concentration of SARS-CoV-2 viral copies this assay can detect is 138 copies/mL. A negative result does not preclude SARS-Cov-2 infection and should not be used as the sole basis for treatment or other patient management decisions. A negative result may occur with  improper specimen  collection/handling, submission of specimen other than nasopharyngeal swab, presence of viral mutation(s) within the areas targeted by this assay, and inadequate number of viral copies(<138 copies/mL). A negative result must be combined with clinical  observations, patient history, and epidemiological information. The expected result is Negative.  Fact Sheet for Patients:  EntrepreneurPulse.com.au  Fact Sheet for Healthcare Providers:  IncredibleEmployment.be  This test is no t yet approved or cleared by the [place] FDA and  has been authorized for detection and/or diagnosis of SARS-CoV-2 by FDA under an Emergency Use Authorization (EUA). This EUA will remain  in effect (meaning this test can be used) for the duration of the COVID-19 declaration under Section 564(b)(1) of the Act, 21 U.S.C.section 360bbb-3(b)(1), unless the authorization is terminated  or revoked sooner.       Influenza A by PCR NEGATIVE NEGATIVE Final   Influenza B by PCR NEGATIVE NEGATIVE Final    Comment: (NOTE) The Xpert Xpress SARS-CoV-2/FLU/RSV plus assay is intended as an aid in the diagnosis of influenza from Nasopharyngeal swab specimens and should not be used as a sole basis for treatment. Nasal washings and aspirates are unacceptable for Xpert Xpress SARS-CoV-2/FLU/RSV testing.  Fact Sheet for Patients: EntrepreneurPulse.com.au  Fact Sheet for Healthcare Providers: IncredibleEmployment.be  This test is not yet approved or cleared by the [place] FDA and has been authorized for detection and/or diagnosis of SARS-CoV-2 by FDA under an Emergency Use Authorization (EUA). This EUA will remain in effect (meaning this test can be used) for the duration of the COVID-19 declaration under Section 564(b)(1) of the Act, 21 U.S.C. section 360bbb-3(b)(1), unless the authorization is terminated or revoked.  Performed at [facility], [address], [place], [place] [address]   Expectorated Sputum Assessment w Gram Stain, Rflx to Resp Cult     Status: None   Collection Time: 07/30/21  7:57 AM   Specimen: Sputum  Result Value Ref Range Status   Specimen Description SPUTUM  Final   Special Requests NONE  Final   Sputum evaluation   Final    THIS SPECIMEN IS ACCEPTABLE FOR SPUTUM CULTURE Performed at [facility], [place] [address] [place], [place] [address]    Report Status 07/30/2021 FINAL  Final  Culture, Respiratory w Gram Stain     Status: None   Collection Time: 07/30/21  7:57 AM   Specimen: SPU  Result Value Ref Range Status   Specimen Description   Final    SPUTUM Performed at [facility] [address] [place], [place] [address]    Special Requests   Final    NONE Reflexed from [id number] Performed at [facility] [address] [place], [place] [address]    Gram Stain   Final    RARE SQUAMOUS EPITHELIAL CELLS PRESENT RARE WBC PRESENT, PREDOMINANTLY PMN RARE GRAM NEGATIVE RODS    Culture   Final    RARE CANDIDA ALBICANS RARE VIRIDANS STREPTOCOCCUS Standardized susceptibility testing for this organism is not available. Performed at [facility], [place] [address], [place], [place] [address]    Report Status 08/01/2021 FINAL  Final  MRSA Next Gen by PCR, Nasal     Status: None   Collection Time: 07/31/21  8:12 AM   Specimen: Nasal Mucosa; Nasal Swab  Result Value Ref Range Status   MRSA by PCR Next Gen NOT DETECTED NOT DETECTED Final    Comment: (NOTE) The GeneXpert MRSA Assay (FDA approved for NASAL specimens only), is one component of a comprehensive MRSA colonization surveillance program. It is not intended to diagnose MRSA infection nor to guide or monitor treatment for MRSA infections. Test performance is not FDA approved in  patients less than 27 years old. Performed at [facility],  [place] [address], [place], [place] [address]           Radiology Studies: DG CHEST PORT 1 VIEW  Result Date: 08/03/2021 CLINICAL DATA:  Hypoxia EXAM: PORTABLE CHEST 1 VIEW COMPARISON:  Chest x-ray 08/01/2021 FINDINGS: Cardiomediastinal silhouette is unchanged. Cardiomegaly. Calcified plaques in the aortic arch. Left-sided atrial loop recorder and right-sided central venous port are stable. Mildly increased extensive ground-glass alveolar opacities throughout the right lung since previous study. No significant change in extensive irregular consolidative opacities throughout the left lung. No pneumothorax. IMPRESSION: Extensive bilateral pneumonia as seen previously, with increasing infiltrates on the right since previous study. Electronically Signed   By: Ofilia Neas M.D.   On: 08/03/2021 09:09   DG CHEST PORT 1 VIEW  Result Date: 08/01/2021 CLINICAL DATA:  Shortness of breath. Non-small cell lung cancer on chemotherapy. EXAM: PORTABLE CHEST 1 VIEW COMPARISON:  Radiograph 2 days ago 07/30/2021.  CT yesterday. FINDINGS: Accessed right chest port in place. Planted loop recorder in the left chest wall. Again seen volume loss in the left hemithorax. Pleuroparenchymal opacity in the lower half of the left hemithorax is similar to prior exam. Stable heart size and mediastinal contours. Patchy airspace disease in the right lung primarily in the upper lobe corresponds to ground-glass opacities on CT, with interval progression. Small right pleural effusion on CT is not well seen by radiograph. No pneumothorax or other significant interval change. IMPRESSION: 1. Patchy airspace disease in the right lung corresponds to ground-glass opacities on CT, with mild interval progression. 2. Otherwise unchanged exam. Chronic volume loss in the left hemithorax with pleuroparenchymal opacity in the left lower lung. Electronically Signed   By: Keith Rake M.D.   On: 08/01/2021 15:04   DG Swallowing  Func-Speech Pathology  Result Date: 08/01/2021 Table formatting from the original result was not included. Objective Swallowing Evaluation: Type of Study: MBS-Modified Barium Swallow Study  Patient Details Name: JOSHAWN CRISSMAN MRN: [id number] Date of Birth: 03/27/1946 Today's Date: 08/01/2021 Time: SLP Start Time (ACUTE ONLY): 1450 -SLP Stop Time (ACUTE ONLY): 1515 SLP Time Calculation (min) (ACUTE ONLY): 25 min Past Medical History: Past Medical History: Diagnosis Date  Anemia   Anxiety   Arthritis   "back, right knee, hands, ankles, neck" (05/31/2016)  Carotid artery disease ([place]) 08/30/2016  Carotid US 11/19: R 1-39, L 40-59 // Carotid US 08/2019: R 1-39; L 40-59 // Carotid US 11/21: R 1-39; L 40-59>> repeat 1 year   Chronic lower back pain   Coronary atherosclerosis of native coronary artery   a. BMS to [facility] 2004 and 2007, otherwise mild nonobstructive disease. EF normal.  Diverticulitis   Dyslipidemia   Dyspnea   Essential hypertension, benign   Family history of breast cancer   Family history of lung cancer   Family history of prostate cancer   GERD (gastroesophageal reflux disease)   Lumbar radiculopathy, chronic 02/04/2015  Right L5  Obesity   OSA on CPAP   uses cpap  Paroxysmal atrial fibrillation ([place])   a. Discovered after stroke.  Pneumonia 01/1996  PONV (postoperative nausea and vomiting)   Prostate CA ([facility] [place])   Recurrent upper respiratory infection (URI)   Stroke ([place]) 07/2012  no deficits  Visit for monitoring Tikosyn therapy 09/18/2019 Past Surgical History: Past Surgical History: Procedure Laterality Date  ADENOIDECTOMY    ANTERIOR CERVICAL DECOMP/DISCECTOMY FUSION  07/2001; 10/2002  "C5-6; C6-7; redo"  BACK SURGERY  CARPAL TUNNEL RELEASE Left 10/2015  CHEST TUBE INSERTION Left 03/17/2021  Procedure: INSERTION PLEURAL DRAINAGE CATHETER;  Surgeon: Garner Nash, DO;  Location: [facility];  Service: Pulmonary;  Laterality: Left;  Indwelling Tunneled Pleural catheter (PLEUREX)   COLONOSCOPY W/  POLYPECTOMY  02/2014  CORONARY ANGIOPLASTY WITH STENT PLACEMENT  05/2003; 12/2005  "mid RCA; mid RCA"  FINE NEEDLE ASPIRATION  02/26/2021  Procedure: FINE NEEDLE ASPIRATION (FNA) LINEAR;  Surgeon: Candee Furbish, MD;  Location[facility] ENDOSCOPY;  Service: Pulmonary;;  HAND SURGERY  02/2019  LEFT HAND  implantable loop recorder placement  02/27/2020  [employer] Reveal [place] model [id number]  implantable loop recorder implanted by Dr Rayann Heman for afib management and evaluation of presyncope  IR IMAGING GUIDED PORT INSERTION  04/16/2021  JOINT REPLACEMENT    KNEE ARTHROSCOPY Left 10/2005  KNEE ARTHROSCOPY W/ PARTIAL MEDIAL MENISCECTOMY Left 09/2005  LUMBAR LAMINECTOMY/DECOMPRESSION MICRODISCECTOMY  03/2005  "L4-5"  POSTERIOR LUMBAR FUSION  10/2003  L5-S1; "plates, screws"  SHOULDER ARTHROSCOPY Right 08/2011  Debridement of labrum, arthroscopic distal clavicle excision  SHOULDER OPEN ROTATOR CUFF REPAIR Left 07/2014  TEE WITHOUT CARDIOVERSION  07/07/2012  Procedure: TRANSESOPHAGEAL ECHOCARDIOGRAM (TEE);  Surgeon: Fay Records, MD;  Location[facility] ENDOSCOPY;  Service: Cardiovascular;  Laterality: N/A;  THORACENTESIS N/A 02/25/2021  Procedure: Mathews Robinsons;  Surgeon: Juanito Doom, MD;  Location: [facility];  Service: Cardiopulmonary;  Laterality: N/A;  TONSILLECTOMY AND ADENOIDECTOMY  ~ 1956  TOTAL KNEE ARTHROPLASTY Left 10/2006  TRIGGER FINGER RELEASE Left 10/2015  VIDEO BRONCHOSCOPY WITH ENDOBRONCHIAL ULTRASOUND Left 02/26/2021  Procedure: VIDEO BRONCHOSCOPY WITH ENDOBRONCHIAL ULTRASOUND;  Surgeon: Candee Furbish, MD;  Location[facility] ENDOSCOPY;  Service: Pulmonary;  Laterality: Left;  cryoprobe too thanks! HPI: 75yo male admitted 07/28/21 with LLE swelling, fatigue and weakness. 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 CXR - unchanged LLL opacity. MRI pending. 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  Pt stated his any  deficits from 2013 CVA resolved.  Pt also with h/o ACDF 0/2002 and "re do" 10/1999 - C5-C6, C6-C7. Swallow eval ordered. RN reports pt's oxygen needs have increased overnight.  Pt denies difficulty with swallowing currently. Endorses reflux/odynophagia during XRT for which he had taken a PPI and carafate *at least.  Subjective: pt awake in chair Assessment / Plan / Recommendation CHL IP CLINICAL IMPRESSIONS 08/01/2021 Clinical Impression Pt was tested with thin, ultrathin (50% water with thin barium mixture), nectar, pudding and cracker.  Pt presents with minimal oral dysphagia characterized by lingual pumping with liquid allowing a few episodes of premature spillage of liquid into pharynx to vallecular space or pyriform sinus prior to swallow trigger.  Pharyngeal swallow is strong with adequate motility without retention.  Decreased timing of laryngeal closure inconsistently resulted in very trace laryngeal penetration of thin but no aspiration.  Due to pt's cervical hardware- chin tuck posture not tested. Upon esophageal sweep, esophagus was clear. After presenting thin barium swallow - pt appeared with retrograde propulsion of liquid to thoracic esophagus causing SLP to question if this could be consistent with dysmotility and contributing to aspiration risk. Defer to referring MD for indication of esophagram during this hospital coarse.  Pt also did not cough during MBS and he was challenged to consume sequential boluses.  Recommend for energy conservation given dyspnea to continue dys3/thin diet.  Will follow up briefly for family education. Pt, spouse and son were educated during [place]. SLP Visit Diagnosis Dysphagia,  oral phase (R13.11) Attention and concentration deficit following -- Frontal lobe and executive function deficit following -- Impact on safety and function Mild aspiration risk   CHL IP TREATMENT RECOMMENDATION 08/01/2021 Treatment Recommendations Therapy as outlined in treatment plan below    Prognosis 08/01/2021 Prognosis for Safe Diet Advancement Good Barriers to Reach Goals -- Barriers/Prognosis Comment -- [facility] IP DIET RECOMMENDATION 08/01/2021 SLP Diet Recommendations Dysphagia 3 (Mech soft) solids;Thin liquid Liquid Administration via Cup;Straw Medication Administration Other (Comment) Compensations Slow rate;Small sips/bites Postural Changes Remain semi-upright after after feeds/meals (Comment);Seated upright at 90 degrees   CH[facility] IP OTHER RECOMMENDATIONS 08/01/2021 Recommended Consults -- Oral Care Recommendations Oral care BID Other Recommendations --   [facility] IP FOLLOW UP RECOMMENDATIONS 08/01/2021 Follow up Recommendations (No Data)   [facility] IP FREQUENCY AND DURATION 08/01/2021 Speech Therapy Frequency (ACUTE ONLY) min 1 x/week Treatment Duration 1 week      CHL IP ORAL PHASE 08/01/2021 Oral Phase Impaired Oral - Pudding Teaspoon -- Oral - Pudding Cup -- Oral - Honey Teaspoon -- Oral - Honey Cup -- Oral - Nectar Teaspoon -- Oral - Nectar Cup -- Oral - Nectar Straw WFL Oral - Thin Teaspoon -- Oral - Thin Cup WFL;Premature spillage Oral - Thin Straw WFL;Premature spillage Oral - Puree Lingual pumping;Delayed oral transit Oral - Mech Soft WFL Oral - Regular -- Oral - Multi-Consistency -- Oral - Pill -- Oral Phase - Comment --  CHL IP PHARYNGEAL PHASE 08/01/2021 Pharyngeal Phase Impaired Pharyngeal- Pudding Teaspoon -- Pharyngeal -- Pharyngeal- Pudding Cup -- Pharyngeal -- Pharyngeal- Honey Teaspoon -- Pharyngeal -- Pharyngeal- Honey Cup -- Pharyngeal -- Pharyngeal- Nectar Teaspoon -- Pharyngeal -- Pharyngeal- Nectar Cup -- Pharyngeal -- Pharyngeal- Nectar Straw Penetration/Aspiration during swallow Pharyngeal Material enters airway, remains ABOVE vocal cords then ejected out Pharyngeal- Thin Teaspoon -- Pharyngeal -- Pharyngeal- Thin Cup Penetration/Aspiration during swallow Pharyngeal Material enters airway, remains ABOVE vocal cords then ejected out Pharyngeal- Thin Straw Penetration/Aspiration during  swallow Pharyngeal Material enters airway, remains ABOVE vocal cords then ejected out Pharyngeal- Puree Delayed swallow initiation-vallecula;WFL Pharyngeal Material does not enter airway Pharyngeal- Mechanical Soft WFL;Delayed swallow initiation-vallecula Pharyngeal Material does not enter airway Pharyngeal- Regular -- Pharyngeal -- Pharyngeal- Multi-consistency -- Pharyngeal -- Pharyngeal- Pill -- Pharyngeal -- Pharyngeal Comment --  CHL IP CERVICAL ESOPHAGEAL PHASE 08/01/2021 Cervical Esophageal Phase Impaired Pudding Teaspoon -- Pudding Cup -- Honey Teaspoon -- Honey Cup -- Nectar Teaspoon -- Nectar Cup -- Nectar Straw -- Thin Teaspoon -- Thin Cup -- Thin Straw -- Puree -- Mechanical Soft -- Regular -- Multi-consistency -- Pill -- Cervical Esophageal Comment appearance of inconsistent prominent CP, did not impair barium flow Kathleen Lime, MS [facility] SLP Acute Rehab Services Office [phone] Pager [phone] Macario Golds 08/01/2021, 5:44 PM                   Scheduled Meds:  acidophilus   Oral Daily   arformoterol  15 mcg Nebulization BID   atorvastatin  40 mg Oral Daily   budesonide (PULMICORT) nebulizer solution  0.5 mg Nebulization BID   carbidopa-levodopa  1 tablet Oral 3 times per day   chlorhexidine  15 mL Mouth Rinse BID   Chlorhexidine Gluconate Cloth  6 each Topical Daily   dofetilide  500 mcg Oral BID   fenofibrate  160 mg Oral Daily   fluconazole  200 mg Oral Daily   fluticasone  2 spray Each Nare Daily   folic acid  1 mg Oral Daily   guaiFENesin  1,200 mg Oral BID   ipratropium  1-2 spray Each Nare Q12H   ipratropium  0.5 mg Nebulization TID   levalbuterol  0.63 mg Nebulization TID   magnesium oxide  400 mg Oral Daily   mouth rinse  15 mL Mouth Rinse q12n4p   metoprolol succinate  25 mg Oral Daily   multivitamin with minerals  1 tablet Oral Daily   pantoprazole  40 mg Oral Daily   potassium chloride  10 mEq Oral Daily   potassium chloride  30 mEq Oral Q4H   rivaroxaban   20 mg Oral Q supper   tamsulosin  0.4 mg Oral QPC supper   topiramate  100 mg Oral BID   Continuous Infusions:  cefTRIAXone (ROCEPHIN)  IV 2 g (08/02/21 1014)   magnesium sulfate bolus IVPB 4 g (08/03/21 1047)   Followed by   magnesium sulfate bolus IVPB     metronidazole 500 mg (08/03/21 1048)     LOS: 5 days    Time spent: 40 minutes    Irine Seal, MD [facility]   To contact the attending provider between 7A-7P or the covering provider during after hours 7P-7A, please log into the web site [email or web address] and access using universal [place] password for that web site. If you do not have the password, please call the hospital operator.  08/03/2021, 12:03 PM

## 2021-08-03 NOTE — Telephone Encounter (Signed)
Scan results.-Pt in hospital. Wife requests a phone visit on 11/3  with her and pt to go over scan and next plan.   She is concerned about delay of chemo and jeopardizing his disease.  Can he have the phone visit on 11/3?

## 2021-08-03 NOTE — Progress Notes (Signed)
Pt placed on Dreamstation BiPAP 12/6 with 4Lpm bleed into circuit.  Pt tolerating well.

## 2021-08-03 NOTE — TOC Progression Note (Signed)
Transition of Care [facility]) - Progression Note    Patient Details  Name: HUTCHINSON ISENBERG MRN: [id number] Date of Birth: 1946/02/14  [facility]) CM/SW Contact  Megin Consalvo, Juliann Pulse, RN Phone Number: 08/03/2021, 3:42 PM  Clinical Narrative: Noted CIR rep Mickel Baas to initiate Josem Kaufmann.      Expected Discharge Plan: IP Rehab Facility Barriers to Discharge: Continued Medical Work up  Expected Discharge Plan and Services Expected Discharge Plan: [place]   Discharge Planning Services: CM Consult   Living arrangements for the past 2 months: Single Family Home                           HH Arranged: PT, OT, Social Work [employer] Agency: [place] Date [place]: 07/31/21 Time [place]: 1412 Representative spoke with at [place]: Cherl rose   Social Determinants of Health ([place]) Interventions    Readmission Risk Interventions No flowsheet data found.

## 2021-08-03 NOTE — Progress Notes (Signed)
Occupational Therapy Treatment Patient Details Name: Corey [name] MRN: [id number] DOB: 09-19-1946 Today's Date: 08/03/2021   History of present illness 75yo male who presented on 10/25 with L LE edema and generalized weakness. Also with recent hx of PNA. Admitted with L LE cellulitis. PMH NSCLC on chemo, Afib, CAD, chronic LBP with radiculopathy, HLD, HTN, prostate CA, CVA, ACDF, back surgery, L rotator cuff repair, TKA   OT comments  Patient was able to participate in seated grooming tasks with compensatory strategies for combing hair with use of phone mirror. Patient is motivated to participate in inpatient rehab setting in future.patient remains +2 for transfers with RW for safety at this time.  Patients brother who is present continues to report that patient is not at his baseline at this time. Patient would continue to benefit from skilled OT services at this time while admitted and after d/c to address noted deficits in order to improve overall safety and independence in ADLs.      Recommendations for follow up therapy are one component of a multi-disciplinary discharge planning process, led by the attending physician.  Recommendations may be updated based on patient status, additional functional criteria and insurance authorization.    Follow Up Recommendations  Acute inpatient rehab (3hours/day)    Assistance Recommended at Discharge Frequent or constant Supervision/Assistance  Equipment Recommendations  [facility]    Recommendations for Other Services      Precautions / Restrictions Precautions Precautions: Fall Precaution Comments: CHEMO Restrictions Weight Bearing Restrictions: No       Mobility Bed Mobility Overal bed mobility: Needs Assistance Bed Mobility: Supine to Sit     Supine to sit: Max assist;Total assist     General bed mobility comments: pt required increased assist to transfer from supine to sidelying to EOB with HOB elevated and use of rail.  Pt < 10% self  able due to profound weakness.    Transfers Overall transfer level: Needs assistance Equipment used: Rolling walker (2 wheels);None Transfers: Sit to/from [facility] Sit to Stand: Mod assist;+2 physical assistance;+2 safety/equipment Stand pivot transfers: Total assist;+2 physical assistance;+2 safety/equipment         General transfer comment: pt required + 2 side by side assist with inablity to push self up so allowed himw to pull up using walker.  MAX c/o weakness and c/o increased dizziness.  BP standing 107/57 with HR 114 and sats decreased from 95% at rest to 83% remeining on 6 lts.  Pr performed 2 brief sit to stands.  Assisted to recliner.     Balance                                           ADL either performed or assessed with clinical judgement   ADL Overall ADL's : Needs assistance/impaired     Grooming: Wash/dry hands;Sitting;Set up;Oral care;Wash/dry face Grooming Details (indicate cue type and reason): patient was noted to cough up some red/green colored phlem after brushing teeth. nurse was in room and to contact MD. patient was ntoed toh ave increaed SOB with coughing with O2 dropping to 82% on RA with patient having removed nasal cannula to wash face. O2 was returned with O2 rising to 93% with eduaction on deep breathing.   Upper Body Bathing Details (indicate cue type and reason): patient declined to wash up stating he had done that previous night  General ADL Comments: patient required max A x 2 to transfer from edge of bed to recliner with patient reporting increased dizziness at this time. nursing aware. patients HR was noted to elevate to 124 bpm with transfer. patients vitals seated in recliner were126/77 HR was 114.     Vision Patient Visual Report: No change from baseline     Perception     Praxis      Cognition Arousal/Alertness: Awake/alert Behavior During Therapy: WFL for  tasks assessed/performed Overall Cognitive Status: Within Functional Limits for tasks assessed                                 General Comments: AxO x 3 very pleasant but feels real weak. brother was also in room on this date          Exercises     Shoulder Instructions       General Comments      Pertinent Vitals/ Pain       Pain Assessment: No/denies pain  Home Living                                          Prior Functioning/Environment              Frequency  Min 2X/week        Progress Toward Goals  OT Goals(current goals can now be found in the care plan section)  Progress towards OT goals: Progressing toward goals  Acute Rehab OT Goals OT Goal Formulation: With patient Time For Goal Achievement: 08/14/21 Potential to Achieve Goals: Good  Plan Discharge plan remains appropriate    Co-evaluation                 AM-PAC OT "6 Clicks" Daily Activity     Outcome Measure   Help from another person eating meals?: None Help from another person taking care of personal grooming?: A Little Help from another person toileting, which includes using toliet, bedpan, or urinal?: A Lot Help from another person bathing (including washing, rinsing, drying)?: A Lot Help from another person to put on and taking off regular upper body clothing?: A Little Help from another person to put on and taking off regular lower body clothing?: A Lot 6 Click Score: 16    End of Session Equipment Utilized During Treatment: Gait belt;Rolling walker (2 wheels)  OT Visit Diagnosis: Unsteadiness on feet (R26.81)   Activity Tolerance Patient limited by fatigue   Patient Left in chair;with call bell/phone within reach;with chair alarm set;with family/visitor present   Nurse Communication Other (comment) (nurse in room to provide medication at end of session)        Time: 1015-1036 OT Time Calculation (min): 21 min  Charges: OT General  Charges $OT Visit: 1 Visit OT Treatments $Self Care/Home Management : 8-22 mins  Jackelyn Poling OTR/L, MS Acute Rehabilitation Department Office# [phone] Pager# [place] 08/03/2021, 12:32 PM

## 2021-08-03 NOTE — Consult Note (Addendum)
NAME:  Corey [name], MRN:  [id number], DOB:  [birth year], LOS: 5 ADMISSION DATE:  07/28/2021, CONSULTATION DATE:  08/03/2021 REFERRING MD:  Dr Irine Seal, CHIEF COMPLAINT:  acute hypoxemic respiratory failure   History of Present Illness:   75 year old [name] with non-small cell lung cancer stage IV, [details below], OSA, A. fib, coronary artery disease.  Some 10-14 days prior to admission was treated for pneumonia as an outpatient with Levaquin.  Unclear at this point many days of antibiotics he took.  Looks like he finished the course and then 1 week prior to admission the day after he stopped the antibiotics he developed lower extremity cellulitis associated with fatigue and worsening fever and cough.  Admission x-ray showed left lower lobe opacity [no fevers] white count normal.  Left lower extremity cellulitis documented.  Started on antibiotics.  Admitted 07/29/2021  Course in the hospital - It appears that his left lower extremity cellulitis started improving.  But he developed intractable nausea and vomiting in the evening of admission 07/29/2021 and the following day on 07/30/2021.  And then he developed new onset hypoxemia evening of 07/23/2021 requiring up to 8 L high flow nasal cannula by 08/01/2021.  Treated with IV Lasix with some improvement to 4 L nasal cannula.  Antibiotics are being continued.  There is concern for aspiration.  Speech-language pathologist evaluation 08/01/2021 noted that patient did not cough during MBS but he did have some retrograde propulsion of the liquid to thoracic esophagus questioning dysmotility and aspiration.  On 08/03/2021: Persistent hypoxemia 5 L nasal cannula.  Afebrile but low grade fever 2 days prior.   He had his brother states this hypoxemia is categorically new.  Review of his CT scan visualization of the images shows that even August 2022 he had left lower lobe consolidation with air bronchogram.  Currently on admission 07/23/2021 this process  appears to be worse.  Brother in here worried about extreme physical deconditioning. Pertinent  Medical History  Oncology history-last seen Dr. 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 He has no actionable mutation and PD-L1 expression of 30%. He is status post palliative radiotherapy to the obstructive lung mass in the left lung under the care of Dr. Tammi Klippel.  The patient is currently undergoing systemic chemotherapy with carboplatin initially for AUC of 5, Alimta 500 Mg/M2 and Keytruda 200 Mg IV every 3 weeks status post 5 cycles.  Starting from cycle #2 the patient is on treatment with carboplatin for AUC of 4 and Alimta 400 Mg/M2.  Starting from cycle #5 the patient is on maintenance treatment with Alimta and Keytruda every 3 weeks    DIAGNOSIS: Stage IV (T1b, N2, M1c) non-small cell lung cancer favoring adenocarcinoma presented with left lung mass in addition to left hilar adenopathy and malignant left pleural effusion, intrathoracic and upper abdominal lymph nodes, left pleural/subpleural nodularity and hepatic lesions diagnosed in May 2022.     Molecular Studies by [employer]: [id number] 0.5%   Olaparib Yes [id number] 50.9%   Olaparib Yes [id number] 0.8% None      PDL1: 30%   PRIOR THERAPY: 1) Palliative radiotherapy to the obstructive lung mass in the left lung under the care of Dr.  Manning. Last treatment 03/23/21   CURRENT THERAPY: Palliative systemic chemotherapy with carboplatin AUC 5, Alimta 500 mg per metered squared, Keytruda 200 mg IV every 3 weeks.  First dose expected on 03/31/21. Status post 5 cycles.  Starting from cycle  #5 the patient is on maintenance treatment with Alimta 500 Mg/M2 and Keytruda 200 Mg IV every 3 weeks.     has a past medical history of Anemia, Anxiety, Arthritis, Carotid artery disease ([place]) (08/30/2016), Chronic lower back pain, Coronary atherosclerosis of native coronary artery, Diverticulitis, Dyslipidemia, Dyspnea, Essential hypertension, benign, Family history of breast cancer, Family history of lung cancer, Family history of prostate cancer, GERD (gastroesophageal reflux disease), Lumbar radiculopathy, chronic (02/04/2015), Obesity, OSA on CPAP, Paroxysmal atrial fibrillation ([place]), Pneumonia (01/1996), PONV (postoperative nausea and vomiting), Prostate CA ([place]), Recurrent upper respiratory infection (URI), Stroke ([place]) (07/2012), and Visit for monitoring Tikosyn therapy (09/18/2019).   reports that he quit smoking about 18 years ago. His smoking use included cigarettes. He has a 34.00 pack-year smoking history. He has never been exposed to tobacco smoke. He has never used smokeless tobacco.  Past Surgical History:  Procedure Laterality Date   ADENOIDECTOMY     ANTERIOR CERVICAL DECOMP/DISCECTOMY FUSION  07/2001; 10/2002   "C5-6; C6-7; redo"   BACK SURGERY     CARPAL TUNNEL RELEASE Left 10/2015   CHEST TUBE INSERTION Left 03/17/2021   Procedure: INSERTION PLEURAL DRAINAGE CATHETER;  Surgeon: Garner Nash, DO;  Location: [place];  Service: Pulmonary;  Laterality: Left;  Indwelling Tunneled Pleural catheter (PLEUREX)    COLONOSCOPY W/ POLYPECTOMY  02/2014   CORONARY ANGIOPLASTY WITH STENT PLACEMENT  05/2003; 12/2005   "mid RCA; mid RCA"   FINE NEEDLE ASPIRATION  02/26/2021   Procedure: FINE NEEDLE ASPIRATION (FNA) LINEAR;  Surgeon: Candee Furbish, MD;  [facility] ENDOSCOPY;  Service: Pulmonary;;   HAND SURGERY  02/2019   LEFT HAND   implantable loop recorder placement  02/27/2020   Medtronic Reveal [place] model [id number]  implantable loop recorder implanted by Dr Rayann Heman for  afib management and evaluation of presyncope   IR IMAGING GUIDED PORT INSERTION  04/16/2021   JOINT REPLACEMENT     KNEE ARTHROSCOPY Left 10/2005   KNEE ARTHROSCOPY W/ PARTIAL MEDIAL MENISCECTOMY Left 09/2005   LUMBAR LAMINECTOMY/DECOMPRESSION MICRODISCECTOMY  03/2005   "L4-5"   POSTERIOR LUMBAR FUSION  10/2003   L5-S1; "plates, screws"   SHOULDER ARTHROSCOPY Right 08/2011   Debridement of labrum, arthroscopic distal clavicle excision   SHOULDER OPEN ROTATOR CUFF REPAIR Left 07/2014   TEE WITHOUT CARDIOVERSION  07/07/2012   Procedure: TRANSESOPHAGEAL ECHOCARDIOGRAM (TEE);  Surgeon: Fay Records, MD;  Location: [facility] ENDOSCOPY;  Service: Cardiovascular;  Laterality: N/A;   THORACENTESIS N/A 02/25/2021   Procedure: Mathews Robinsons;  Surgeon: Juanito Doom, MD;  Location: [place];  Service: Cardiopulmonary;  Laterality: N/A;   TONSILLECTOMY AND ADENOIDECTOMY  ~ 1956   TOTAL KNEE ARTHROPLASTY Left 10/2006   TRIGGER FINGER RELEASE Left 10/2015   VIDEO BRONCHOSCOPY WITH ENDOBRONCHIAL ULTRASOUND Left 02/26/2021   Procedure: VIDEO BRONCHOSCOPY WITH ENDOBRONCHIAL ULTRASOUND;  Surgeon: Candee Furbish, MD;  Location: [facility] ENDOSCOPY;  Service: Pulmonary;  Laterality: Left;  cryoprobe too thanks!    Allergies  Allergen Reactions   Amoxicillin Swelling    Tolerates Rocephin   Fosinopril Other (See Comments)   Hydromorphone Nausea Only, Other (See Comments) and Nausea And Vomiting   Ampicillin Rash   Monopril [Fosinopril Sodium] Other (See Comments)    Muscle  aches & pain   Other Other (See Comments)   Rosuvastatin Other (See Comments)    Joint pain    Immunization History  Administered Date(s) Administered   Fluad Quad(high Dose 65+) 10/Corey/2021, 07/06/2021   H1N1 09/09/2008   Influenza Split 08/01/2008, 06/10/2010, 06/30/2011, 06/29/2012, 07/06/2013, 07/16/2013, 08/04/2013, 08/09/2014   Influenza, High Dose Seasonal PF 07/21/2014, 09/02/2018   Influenza,inj,Quad PF,6+ Mos 06/15/2016,  08/03/2018, 06/13/2019   Influenza-Unspecified 08/04/2014, 07/05/2015, 06/15/2016, 09/26/2018   PFIZER(Purple Top)SARS-COV-2 Vaccination 10/13/2019, 11/14/2019, 07/15/2020   Pneumococcal Conjugate-13 12/02/2013, 02/19/2014, 07/04/2014   Pneumococcal Polysaccharide-Corey 12/02/2004, 08/04/2012   Td 12/02/2000   Tdap 02/01/2012   Zoster Recombinat (Shingrix) 03/21/2018   Zoster, Live 11/21/2008, 09/20/2017, 03/21/2018    Family History  Problem Relation Age of Onset   Hypertension Mother    Heart disease Father    Heart attack Father    Prostate cancer Brother 40   Parkinson's disease Brother    Lung cancer Paternal Aunt        hx of smoking   Breast cancer Paternal Grandmother        dx 31s, bilateral mastectomies   Heart attack Paternal Grandfather    Blindness Son    Colon cancer Neg Hx    Pancreatic cancer Neg Hx      Current Facility-Administered Medications:    acetaminophen (TYLENOL) tablet 650 mg, 650 mg, Oral, Q6H PRN, 650 mg at 07/29/21 0606 **OR** acetaminophen (TYLENOL) suppository 650 mg, 650 mg, Rectal, Q6H PRN, Marylyn Ishihara, Tyrone A, DO   acetaminophen (TYLENOL) tablet 1,000 mg, 1,000 mg, Oral, Q8H PRN, Marylyn Ishihara, Tyrone A, DO   acidophilus (RISAQUAD) capsule, , Oral, Daily, Kyle, Tyrone A, DO, 1 capsule at 08/03/21 1030   albuterol (PROVENTIL) (2.5 MG/3ML) 0.083% nebulizer solution 3 mL, 3 mL, Inhalation, Q4H PRN, Marylyn Ishihara, Tyrone A, DO   ALPRAZolam Duanne Moron) tablet 0.5 mg, 0.5 mg, Oral, QHS PRN, Marylyn Ishihara, Tyrone A, DO, 0.5 mg at 08/02/21 2127   alum & mag hydroxide-simeth (MAALOX/MYLANTA) 200-200-20 MG/5ML suspension 30 mL, 30 mL, Oral, Q4H PRN, Eugenie Filler, MD, 30 mL at 07/31/21 0930   arformoterol (BROVANA) nebulizer solution 15 mcg, 15 mcg, Nebulization, BID, 15 mcg at 08/03/21 0743 **AND** [DISCONTINUED] umeclidinium bromide (INCRUSE ELLIPTA) 62.5 MCG/ACT 1 puff, 1 puff, Inhalation, Daily, Kyle, Tyrone A, DO, 1 puff at 07/31/21 0735   atorvastatin (LIPITOR) tablet 40 mg, 40 mg,  Oral, Daily, Kyle, Tyrone A, DO, 40 mg at 08/03/21 1032   benzonatate (TESSALON) capsule 200 mg, 200 mg, Oral, TID PRN, Marylyn Ishihara, Tyrone A, DO, 200 mg at 08/01/21 0850   budesonide (PULMICORT) nebulizer solution 0.5 mg, 0.5 mg, Nebulization, BID, Eugenie Filler, MD, 0.5 mg at 08/03/21 0743   carbidopa-levodopa (SINEMET IR) 25-100 MG per tablet immediate release 1 tablet, 1 tablet, Oral, 3 times per day, Marylyn Ishihara, Tyrone A, DO, 1 tablet at 08/03/21 1253   cefTRIAXone (ROCEPHIN) 2 g in sodium chloride 0.9 % 100 mL IVPB, 2 g, Intravenous, Q24H, Kyle, Tyrone A, DO, Last Rate: 200 mL/hr at 08/02/21 1014, 2 g at 08/02/21 1014   chlorhexidine (PERIDEX) 0.12 % solution 15 mL, 15 mL, Mouth Rinse, BID, Kyle, Tyrone A, DO, 15 mL at 08/03/21 0950   Chlorhexidine Gluconate Cloth 2 % PADS 6 each, 6 each, Topical, Daily, Kyle, Tyrone A, DO, 6 each at 08/03/21 0950   chlorpheniramine-HYDROcodone (TUSSIONEX) 10-8 MG/5ML suspension 5 mL, 5 mL, Oral, Q12H PRN, Marylyn Ishihara, Tyrone A, DO, 5 mL at 08/01/21 0148   dofetilide (TIKOSYN) capsule 500  mcg, 500 mcg, Oral, BID, Kyle, Tyrone A, DO, 500 mcg at 08/03/21 1030   fenofibrate tablet 160 mg, 160 mg, Oral, Daily, Kyle, Tyrone A, DO, 160 mg at 08/03/21 1029   fluconazole (DIFLUCAN) tablet 200 mg, 200 mg, Oral, Daily, Bell, Michelle T, RPH, 200 mg at 08/03/21 1031   fluticasone (FLONASE) 50 MCG/ACT nasal spray 2 spray, 2 spray, Each Nare, Daily, Kyle, Tyrone A, DO, 2 spray at 68/34/19 6222   folic acid (FOLVITE) tablet 1 mg, 1 mg, Oral, Daily, Bell, Michelle T, RPH, 1 mg at 08/03/21 1029   guaiFENesin (MUCINEX) 12 hr tablet 1,200 mg, 1,200 mg, Oral, BID, Eugenie Filler, MD, 1,200 mg at 08/03/21 1031   HYDROcodone-acetaminophen (NORCO/VICODIN) 5-325 MG per tablet 1-2 tablet, 1-2 tablet, Oral, Q6H PRN, Marylyn Ishihara, Tyrone A, DO, 1 tablet at 07/28/21 2254   ipratropium (ATROVENT) 0.03 % nasal spray 1-2 spray, 1-2 spray, Each Nare, Q12H, Eugenie Filler, MD, 1 spray at 08/03/21 1042    ipratropium (ATROVENT) nebulizer solution 0.5 mg, 0.5 mg, Nebulization, TID, Curt Bears, MD, 0.5 mg at 08/03/21 9798   levalbuterol (XOPENEX) nebulizer solution 0.63 mg, 0.63 mg, Nebulization, TID, Curt Bears, MD, 0.63 mg at 08/03/21 9211   loperamide (IMODIUM) capsule 2 mg, 2 mg, Oral, Daily PRN, Eugenie Filler, MD   loratadine (CLARITIN) tablet 10 mg, 10 mg, Oral, Daily PRN, Eudelia Bunch, RPH, 10 mg at 08/01/21 0850   magnesium oxide (MAG-OX) tablet 400 mg, 400 mg, Oral, Daily, Eugenie Filler, MD, 400 mg at 08/03/21 1032   [COMPLETED] magnesium sulfate IVPB 4 g 100 mL, 4 g, Intravenous, Once, Last Rate: 50 mL/hr at 08/03/21 1047, 4 g at 08/03/21 1047 **FOLLOWED BY** magnesium sulfate IVPB 2 g 50 mL, 2 g, Intravenous, Once, Eugenie Filler, MD, Last Rate: 50 mL/hr at 08/03/21 1256, 2 g at 08/03/21 1256   MEDLINE mouth rinse, 15 mL, Mouth Rinse, q12n4p, Kyle, Tyrone A, DO, 15 mL at 08/03/21 1218   metoprolol succinate (TOPROL-XL) 24 hr tablet 25 mg, 25 mg, Oral, Daily, Eugenie Filler, MD, 25 mg at 08/03/21 1032   metroNIDAZOLE (FLAGYL) IVPB 500 mg, 500 mg, Intravenous, Q12H, Eugenie Filler, MD, Last Rate: 100 mL/hr at 08/03/21 1048, 500 mg at 08/03/21 1048   multivitamin with minerals tablet 1 tablet, 1 tablet, Oral, Daily, Kyle, Tyrone A, DO, 1 tablet at 08/03/21 1033   ondansetron (ZOFRAN) tablet 4 mg, 4 mg, Oral, Q6H PRN **OR** ondansetron (ZOFRAN) injection 4 mg, 4 mg, Intravenous, Q6H PRN, Marylyn Ishihara, Tyrone A, DO, 4 mg at 07/30/21 1401   pantoprazole (PROTONIX) EC tablet 40 mg, 40 mg, Oral, Daily, Eudelia Bunch, RPH, 40 mg at 08/03/21 1032   potassium chloride (KLOR-CON) CR tablet 10 mEq, 10 mEq, Oral, Daily, Eugenie Filler, MD   prochlorperazine (COMPAZINE) injection 10 mg, 10 mg, Intravenous, Q6H PRN, Eugenie Filler, MD, 10 mg at 07/30/21 0914   promethazine (PHENERGAN) tablet 25 mg, 25 mg, Oral, Q6H PRN, Marylyn Ishihara, Tyrone A, DO, 25 mg at 07/29/21 1837    rivaroxaban (XARELTO) tablet 20 mg, 20 mg, Oral, Q supper, Eugenie Filler, MD, 20 mg at 08/02/21 1617   sodium chloride flush (NS) 0.9 % injection 10-40 mL, 10-40 mL, Intracatheter, PRN, Marylyn Ishihara, Tyrone A, DO   tamsulosin (FLOMAX) capsule 0.4 mg, 0.4 mg, Oral, QPC supper, Kyle, Tyrone A, DO, 0.4 mg at 08/02/21 1617   topiramate (TOPAMAX) tablet 100 mg, 100 mg, Oral, BID, Marylyn Ishihara, Tyrone A,  DO, 100 mg at 08/03/21 1030   traMADol (ULTRAM) tablet 50 mg, 50 mg, Oral, Q6H PRN, Marylyn Ishihara, Tyrone A, DO   traZODone (DESYREL) tablet 25 mg, 25 mg, Oral, QHS PRN, Marylyn Ishihara, Tyrone A, DO, 25 mg at 08/02/21 2128   Significant Hospital Events: Including procedures, antibiotic start and stop dates in addition to other pertinent events   07/28/2021 - admit 10/31- ccm consul;t  Interim History / Subjective:  x  Objective   Blood pressure 107/71, pulse (!) 102, temperature 98.8 F (37.1 C), temperature source Oral, resp. rate 20, height 6' 1.5" (1.867 m), weight 102.8 kg, SpO2 97 %.        Intake/Output Summary (Last 24 hours) at 08/03/2021 1303 Last data filed at 08/03/2021 0719 Gross per 24 hour  Intake 535.72 ml  Output 3650 ml  Net -3114.28 ml   Filed Weights   07/29/21 0510 08/02/21 0531 08/03/21 0617  Weight: 102.1 kg 102.2 kg 102.8 kg    Examination: General: [name] in no distress lying in the bed with oxygen on.  Brother at the bedside HENT: Oxygen on.  No elevated JVP.  No palpable neck nodes Lungs: Mild tachypnea but not paradoxical.  Crackles on the left side base Cardiovascular: Normal heart sounds Abdomen: Soft nontender no organomegaly Extremities: No cyanosis no clubbing no edema Neuro: Alert and oriented x3.  Moves all fours GU: Not examined   LABS    PULMONARY Recent Labs  Lab 08/01/21 1530  PHART 7.411  PCO2ART 31.9*  PO2ART 114*  HCO3 19.9*  O2SAT 99.0    CBC Recent Labs  Lab 08/01/21 0457 08/02/21 0321 08/03/21 0315  HGB 7.3* 7.0* 8.9*  HCT 22.2* 21.6* 26.7*   WBC 6.4 6.3 6.2  PLT 140* 146* 148*    COAGULATION Recent Labs  Lab 07/28/21 1109  INR 1.6*    CARDIAC  No results for input(s): TROPONINI in the last 168 hours. No results for input(s): PROBNP in the last 168 hours.   CHEMISTRY Recent Labs  Lab 07/30/21 0500 07/31/21 0400 08/01/21 0457 08/02/21 0321 08/03/21 0315  NA 134* 133* 130* 129* 129*  K 3.5 4.1 3.9 3.4* 3.6  CL 101 103 104 99 97*  CO2 24 21* 21* Corey 25  GLUCOSE 123* 90 91 94 106*  BUN _0 CREATININE 0.66 0.58* 0.64 0.61 0.63  CALCIUM 8.2* 8.1* 8.0* 8.1* 8.3*  MG 2.0 1.8 1.7 1.8 1.7  PHOS  --  2.3*  --   --   --    Estimated Creatinine Clearance: 102.9 mL/min (by C-G formula based on SCr of 0.63 mg/dL).   LIVER Recent Labs  Lab 07/28/21 1109 07/29/21 0648 07/30/21 0500 07/31/21 0400  AST 27 27 38  --   ALT _1 --   ALKPHOS 56 49 54  --   BILITOT 0.8 0.8 0.8  --   PROT 6.8 6.4* 6.7  --   ALBUMIN 3.0* 2.2* 2.3* 1.8*  INR 1.6*  --   --   --      INFECTIOUS Recent Labs  Lab 07/28/21 1144 07/28/21 1403  LATICACIDVEN 0.7 0.5     ENDOCRINE CBG (last 3)  No results for input(s): GLUCAP in the last 72 hours.       IMAGING x48h  - image(s) personally visualized  -   highlighted in bold DG CHEST PORT 1 VIEW  Result Date: 08/03/2021 CLINICAL DATA:  Hypoxia EXAM: PORTABLE CHEST 1 VIEW COMPARISON:  Chest x-ray 08/01/2021 FINDINGS: Cardiomediastinal silhouette is unchanged. Cardiomegaly. Calcified plaques in the aortic arch. Left-sided atrial loop recorder and right-sided central venous port are stable. Mildly increased extensive ground-glass alveolar opacities throughout the right lung since previous study. No significant change in extensive irregular consolidative opacities throughout the left lung. No pneumothorax. IMPRESSION: Extensive bilateral pneumonia as seen previously, with increasing infiltrates on the right since previous study. Electronically Signed   By: Ofilia Neas M.D.   On: 08/03/2021 09:09   DG Swallowing Func-Speech Pathology  Result Date: 08/01/2021 Table formatting from the original result was not included. Objective Swallowing Evaluation: Type of Study: MBS-Modified Barium Swallow Study  Patient Details Name: FINLEY CHEVEZ MRN: [id number] Date of Birth: April 06, 1946 Today's Date: 08/01/2021 Time: SLP Start Time (ACUTE ONLY): 1450 -SLP Stop Time (ACUTE ONLY): 1515 SLP Time Calculation (min) (ACUTE ONLY): 25 min Past Medical History: Past Medical History: Diagnosis Date  Anemia   Anxiety   Arthritis   "back, right knee, hands, ankles, neck" (05/31/2016)  Carotid artery disease ([place]) 08/30/2016  Carotid US 11/19: R 1-39, L 40-59 // Carotid US 08/2019: R 1-39; L 40-59 // Carotid US 11/21: R 1-39; L 40-59>> repeat 1 year   Chronic lower back pain   Coronary atherosclerosis of native coronary artery   a. BMS to [facility] 2004 and 2007, otherwise mild nonobstructive disease. EF normal.  Diverticulitis   Dyslipidemia   Dyspnea   Essential hypertension, benign   Family history of breast cancer   Family history of lung cancer   Family history of prostate cancer   GERD (gastroesophageal reflux disease)   Lumbar radiculopathy, chronic 02/04/2015  Right L5  Obesity   OSA on CPAP   uses cpap  Paroxysmal atrial fibrillation ([place])   a. Discovered after stroke.  Pneumonia 01/1996  PONV (postoperative nausea and vomiting)   Prostate CA ([facility])   Recurrent upper respiratory infection (URI)   Stroke ([place]) 07/2012  no deficits  Visit for monitoring Tikosyn therapy 09/18/2019 Past Surgical History: Past Surgical History: Procedure Laterality Date  ADENOIDECTOMY    ANTERIOR CERVICAL DECOMP/DISCECTOMY FUSION  07/2001; 10/2002  "C5-6; C6-7; redo"  BACK SURGERY    CARPAL TUNNEL RELEASE Left 10/2015  CHEST TUBE INSERTION Left 03/17/2021  Procedure: INSERTION PLEURAL DRAINAGE CATHETER;  Surgeon: Garner Nash, DO;  Location: [place];  Service: Pulmonary;  Laterality: Left;  Indwelling  Tunneled Pleural catheter (PLEUREX)   COLONOSCOPY W/ POLYPECTOMY  02/2014  CORONARY ANGIOPLASTY WITH STENT PLACEMENT  05/2003; 12/2005  "mid RCA; mid RCA"  FINE NEEDLE ASPIRATION  02/26/2021  Procedure: FINE NEEDLE ASPIRATION (FNA) LINEAR;  Surgeon: Candee Furbish, MD;  Location: [facility] ENDOSCOPY;  Service: Pulmonary;;  HAND SURGERY  02/2019  LEFT HAND  implantable loop recorder placement  02/27/2020  Medtronic Reveal [place] model [id number]  implantable loop recorder implanted by Dr Rayann Heman for afib management and evaluation of presyncope  IR IMAGING GUIDED PORT INSERTION  04/16/2021  JOINT REPLACEMENT    KNEE ARTHROSCOPY Left 10/2005  KNEE ARTHROSCOPY W/ PARTIAL MEDIAL MENISCECTOMY Left 09/2005  LUMBAR LAMINECTOMY/DECOMPRESSION MICRODISCECTOMY  03/2005  "L4-5"  POSTERIOR LUMBAR FUSION  10/2003  L5-S1; "plates, screws"  SHOULDER ARTHROSCOPY Right 08/2011  Debridement of labrum, arthroscopic distal clavicle excision  SHOULDER OPEN ROTATOR CUFF REPAIR Left 07/2014  TEE WITHOUT CARDIOVERSION  07/07/2012  Procedure: TRANSESOPHAGEAL ECHOCARDIOGRAM (TEE);  Surgeon: Fay Records, MD;  Location: [facility] ENDOSCOPY;  Service: Cardiovascular;  Laterality: N/A;  THORACENTESIS N/A 02/25/2021  Procedure: Mathews Robinsons;  Surgeon: Juanito Doom, MD;  Location: [place];  Service: Cardiopulmonary;  Laterality: N/A;  TONSILLECTOMY AND ADENOIDECTOMY  ~ 1956  TOTAL KNEE ARTHROPLASTY Left 10/2006  TRIGGER FINGER RELEASE Left 10/2015  VIDEO BRONCHOSCOPY WITH ENDOBRONCHIAL ULTRASOUND Left 02/26/2021  Procedure: VIDEO BRONCHOSCOPY WITH ENDOBRONCHIAL ULTRASOUND;  Surgeon: Candee Furbish, MD;  Location[facility] ENDOSCOPY;  Service: Pulmonary;  Laterality: Left;  cryoprobe too thanks! HPI: 75yo [name] admitted 07/28/21 with LLE swelling, fatigue and weakness. 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 CXR - unchanged LLL opacity. MRI pending. 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  Pt stated his any deficits from 2013 CVA resolved.  Pt also with h/o ACDF 0/2002 and "re do" 10/1999 - C5-C6, C6-C7. Swallow eval ordered. RN reports pt's oxygen needs have increased overnight.  Pt denies difficulty with swallowing currently. Endorses reflux/odynophagia during XRT for which he had taken a PPI and carafate *at least.  Subjective: pt awake in chair Assessment / Plan / Recommendation CHL IP CLINICAL IMPRESSIONS 08/01/2021 Clinical Impression Pt was tested with thin, ultrathin (50% water with thin barium mixture), nectar, pudding and cracker.  Pt presents with minimal oral dysphagia characterized by lingual pumping with liquid allowing a few episodes of premature spillage of liquid into pharynx to vallecular space or pyriform sinus prior to swallow trigger.  Pharyngeal swallow is strong with adequate motility without retention.  Decreased timing of laryngeal closure inconsistently resulted in very trace laryngeal penetration of thin but no aspiration.  Due to pt's cervical hardware- chin tuck posture not tested. Upon esophageal sweep, esophagus was clear. After presenting thin barium swallow - pt appeared with retrograde propulsion of liquid to thoracic esophagus causing SLP to question if this could be consistent with dysmotility and contributing to aspiration risk. Defer to referring MD for indication of esophagram during this hospital coarse.  Pt also did not cough during MBS and he was challenged to consume sequential boluses.  Recommend for energy conservation given dyspnea to continue dys3/thin diet.  Will follow up briefly for family education. Pt, spouse and son were educated during [place]. SLP Visit Diagnosis Dysphagia, oral phase (R13.11) Attention and concentration deficit following -- Frontal lobe and executive function deficit following -- Impact on safety and function Mild aspiration risk   CHL IP TREATMENT RECOMMENDATION 08/01/2021 Treatment Recommendations  Therapy as outlined in treatment plan below   Prognosis 08/01/2021 Prognosis for Safe Diet Advancement Good Barriers to Reach Goals -- Barriers/Prognosis Comment -- CHL IP DIET RECOMMENDATION 08/01/2021 SLP Diet Recommendations Dysphagia 3 (Mech soft) solids;Thin liquid Liquid Administration via Cup;Straw Medication Administration Other (Comment) Compensations Slow rate;Small sips/bites Postural Changes Remain semi-upright after after feeds/meals (Comment);Seated upright at 90 degrees   CHL IP OTHER RECOMMENDATIONS 08/01/2021 Recommended Consults -- Oral Care Recommendations Oral care BID Other Recommendations --   CHL IP FOLLOW UP RECOMMENDATIONS 08/01/2021 Follow up Recommendations (No Data)   CHL IP FREQUENCY AND DURATION 08/01/2021 Speech Therapy Frequency (ACUTE ONLY) min 1 x/week Treatment Duration 1 week      CHL IP ORAL PHASE 08/01/2021 Oral Phase Impaired Oral - Pudding Teaspoon -- Oral - Pudding Cup -- Oral - Honey Teaspoon -- Oral - Honey Cup -- Oral - Nectar Teaspoon -- Oral - Nectar Cup -- Oral - Nectar Straw WFL Oral - Thin Teaspoon -- Oral - Thin Cup WFL;Premature spillage Oral - Thin Straw WFL;Premature spillage Oral - Puree Lingual pumping;Delayed oral transit Oral - Mech Soft  WFL Oral - Regular -- Oral - Multi-Consistency -- Oral - Pill -- Oral Phase - Comment --  CHL IP PHARYNGEAL PHASE 08/01/2021 Pharyngeal Phase Impaired Pharyngeal- Pudding Teaspoon -- Pharyngeal -- Pharyngeal- Pudding Cup -- Pharyngeal -- Pharyngeal- Honey Teaspoon -- Pharyngeal -- Pharyngeal- Honey Cup -- Pharyngeal -- Pharyngeal- Nectar Teaspoon -- Pharyngeal -- Pharyngeal- Nectar Cup -- Pharyngeal -- Pharyngeal- Nectar Straw Penetration/Aspiration during swallow Pharyngeal Material enters airway, remains ABOVE vocal cords then ejected out Pharyngeal- Thin Teaspoon -- Pharyngeal -- Pharyngeal- Thin Cup Penetration/Aspiration during swallow Pharyngeal Material enters airway, remains ABOVE vocal cords then ejected out  Pharyngeal- Thin Straw Penetration/Aspiration during swallow Pharyngeal Material enters airway, remains ABOVE vocal cords then ejected out Pharyngeal- Puree Delayed swallow initiation-vallecula;WFL Pharyngeal Material does not enter airway Pharyngeal- Mechanical Soft WFL;Delayed swallow initiation-vallecula Pharyngeal Material does not enter airway Pharyngeal- Regular -- Pharyngeal -- Pharyngeal- Multi-consistency -- Pharyngeal -- Pharyngeal- Pill -- Pharyngeal -- Pharyngeal Comment --  CHL IP CERVICAL ESOPHAGEAL PHASE 08/01/2021 Cervical Esophageal Phase Impaired Pudding Teaspoon -- Pudding Cup -- Honey Teaspoon -- Honey Cup -- Nectar Teaspoon -- Nectar Cup -- Nectar Straw -- Thin Teaspoon -- Thin Cup -- Thin Straw -- Puree -- Mechanical Soft -- Regular -- Multi-consistency -- Pill -- Cervical Esophageal Comment appearance of inconsistent prominent CP, did not impair barium flow Kathleen Lime, MS [facility] SLP Acute Rehab Services Office [phone] Pager [phone] Macario Golds 08/01/2021, 5:44 PM                 Resolved Hospital Problem list   x  Assessment & Plan:  Chronic left lower lobe atelectasis/consolidation - Prior to & Present on Admit -seen on CT scan chest May 2022  Acute hypoxemic respiratory failure-in the setting of cellulitis and also potential aspiration.  Developed post admit-associated with worsening left lower lobe and greater than right lower lobe infiltrates - > atelectasis playing a role versus infectious process versus developing ARDS with or without aspiration pneumonitis  Plan - Antibiotics as below - Check sedimentation rate, procalcitonin, QuantiFERON gold -Start early BiPAP nightly and daytime 2 applications [potential benefit and patient with moderate hypoxic respiratory failure and immunosuppressed] -Okay to be in stepdown -If infectious work-up negative and ESR hihh might have to consider focal BOOP and/or drug  induced pneumoitis (eg Keytruda)  Best Practice  (right click and "Reselect all SmartList Selections" daily)  According to the hospitalist     ATTESTATION & SIGNATURE  Dr. Brand Males, M.D., [facility].C.P Pulmonary and Critical Care Medicine Staff Physician [place] [facility] Pager: [phone], If no answer or between  15:00h - 7:00h: call [phone]  08/03/2021 1:04 PM

## 2021-08-03 NOTE — Plan of Care (Signed)

## 2021-08-03 NOTE — Progress Notes (Signed)
Physical Therapy Treatment Patient Details Name: Corey [name] MRN: [id number] DOB: 1946-09-12 Today's Date: 08/03/2021   History of Present Illness 75yo male who presented on 10/25 with L LE edema and generalized weakness. Also with recent hx of PNA. Admitted with L LE cellulitis. PMH NSCLC on chemo, Afib, CAD, chronic LBP with radiculopathy, HLD, HTN, prostate CA, CVA, ACDF, back surgery, L rotator cuff repair, TKA    PT Comments    General Comments: AxO x 3 very pleasant but feels real weak.  Pt did receive 2 units of blood yesterday and he was hoping that would make him feel better.  Pt currently in bed on 6 lts sats 95%.   Pt required + 2 assist for OOB.  General bed mobility comments: pt required increased assist to transfer from supine to sidelying to EOB with HOB elevated and use of rail.  Pt < 10% self able due to profound weakness.General transfer comment: pt required + 2 side by side assist with inablity to push self up so allowed himw to pull up using walker.  MAX c/o weakness and c/o increased dizziness.  BP standing 107/57 with HR 114 and sats decreased from 95% at rest to 83% remeining on 6 lts.  Pr performed 2 brief sit to stands.  Assisted to recliner.General Gait Details: not able to attempt due to limited activity tolerance during transfer Vitals during session: Supine          BP 115/57, HR 114, sats 95% on 6 lts nasal EOB              BP 107/57, HR 128, sats 83% on 6 lts nasal MAX c/o dizziness Recliner        BP 126/75, HR 119, sats 93% on 5 lts nasal   Left pt in recliner with OT and 2 nurses in room.  Chair alarm active/call light in reach. Per chart review, family would like to avoid repeat SNF and see what CIR can offer.   Recommendations for follow up therapy are one component of a multi-disciplinary discharge planning process, led by the attending physician.  Recommendations may be updated based on patient status, additional functional criteria and insurance  authorization.  Follow Up Recommendations  Acute inpatient rehab (3hours/day)     Assistance Recommended at Discharge Frequent or constant Supervision/Assistance  Equipment Recommendations  None recommended by PT    Recommendations for Other Services       Precautions / Restrictions Precautions Precautions: Fall Precaution Comments: CHEMO     Mobility  Bed Mobility Overal bed mobility: Needs Assistance Bed Mobility: Supine to Sit     Supine to sit: Max assist;Total assist     General bed mobility comments: pt required increased assist to transfer from supine to sidelying to EOB with HOB elevated and use of rail.  Pt < 10% self able due to profound weakness.    Transfers Overall transfer level: Needs assistance Equipment used: Rolling walker (2 wheels);None Transfers: Sit to/from [facility] Sit to Stand: Mod assist;+2 physical assistance;+2 safety/equipment Stand pivot transfers: Total assist;+2 physical assistance;+2 safety/equipment         General transfer comment: pt required + 2 side by side assist with inablity to push self up so allowed himw to pull up using walker.  MAX c/o weakness and c/o increased dizziness.  BP standing 107/57 with HR 114 and sats decreased from 95% at rest to 83% remeining on 6 lts.  Pr performed 2 brief sit to stands.  Assisted to recliner.    Ambulation/Gait             General Gait Details: not able to attempt due to limited activity tolerance during transfer   Stairs             Wheelchair Mobility    Modified Rankin (Stroke Patients Only)       Balance                                            Cognition Arousal/Alertness: Awake/alert Behavior During Therapy: WFL for tasks assessed/performed Overall Cognitive Status: Within Functional Limits for tasks assessed                                 General Comments: AxO x 3 very pleasant but feels real weak         Exercises      General Comments        Pertinent Vitals/Pain Pain Assessment: No/denies pain    Home Living                          Prior Function            PT Goals (current goals can now be found in the care plan section) Progress towards PT goals: Progressing toward goals    Frequency    Min 4X/week      PT Plan Current plan remains appropriate    Co-evaluation              AM-PAC PT "6 Clicks" Mobility   Outcome Measure  Help needed turning from your back to your side while in a flat bed without using bedrails?: Total Help needed moving from lying on your back to sitting on the side of a flat bed without using bedrails?: Total Help needed moving to and from a bed to a chair (including a wheelchair)?: Total Help needed standing up from a chair using your arms (e.g., wheelchair or bedside chair)?: Total Help needed to walk in hospital room?: Total Help needed climbing 3-5 steps with a railing? : Total 6 Click Score: 6    End of Session Equipment Utilized During Treatment: Gait belt Activity Tolerance: Patient limited by fatigue;Other (comment) (dizziness) Patient left: in chair;with call bell/phone within reach;with chair alarm set Nurse Communication: Mobility status PT Visit Diagnosis: Unsteadiness on feet (R26.81);Muscle weakness (generalized) (M62.81);Difficulty in walking, not elsewhere classified (R26.2)     Time: 1000-1025 PT Time Calculation (min) (ACUTE ONLY): 25 min  Charges:  $Therapeutic Activity: 23-37 mins                    Rica Koyanagi  PTA Acute  Rehabilitation Services Pager      [phone] Office      [phone]

## 2021-08-03 NOTE — Progress Notes (Signed)
PHARMACIST - PHYSICIAN COMMUNICATION  CONCERNING: Antibiotic IV to Oral Route Change Policy  RECOMMENDATION: This patient is receiving fluconazole by the intravenous route.  Based on criteria approved by the Pharmacy and Therapeutics Committee, the antibiotic(s) is/are being converted to the equivalent oral dose form(s).   DESCRIPTION: These criteria include: Patient being treated for a respiratory tract infection, urinary tract infection, cellulitis or clostridium difficile associated diarrhea if on metronidazole The patient is not neutropenic and does not exhibit a GI malabsorption state The patient is eating (either orally or via tube) and/or has been taking other orally administered medications for a least 24 hours The patient is improving clinically and has a Tmax < 100.5  If you have questions about this conversion, please contact the Pharmacy Department  []   [phone] )  [facility] []   [phone] )  [facility] []   [phone] )  [facility] []   [phone] )  [facility] [x]   [phone] )  [facility]

## 2021-08-04 ENCOUNTER — Inpatient Hospital Stay: Payer: Medicare PPO

## 2021-08-04 ENCOUNTER — Inpatient Hospital Stay (HOSPITAL_COMMUNITY): Payer: Medicare PPO

## 2021-08-04 ENCOUNTER — Inpatient Hospital Stay: Payer: Medicare PPO | Admitting: Internal Medicine

## 2021-08-04 DIAGNOSIS — I480 Paroxysmal atrial fibrillation: Secondary | ICD-10-CM | POA: Diagnosis not present

## 2021-08-04 DIAGNOSIS — R0603 Acute respiratory distress: Secondary | ICD-10-CM

## 2021-08-04 DIAGNOSIS — L03116 Cellulitis of left lower limb: Secondary | ICD-10-CM | POA: Diagnosis not present

## 2021-08-04 DIAGNOSIS — I10 Essential (primary) hypertension: Secondary | ICD-10-CM | POA: Diagnosis not present

## 2021-08-04 DIAGNOSIS — J9601 Acute respiratory failure with hypoxia: Secondary | ICD-10-CM | POA: Diagnosis not present

## 2021-08-04 DIAGNOSIS — I77810 Thoracic aortic ectasia: Secondary | ICD-10-CM

## 2021-08-04 DIAGNOSIS — J189 Pneumonia, unspecified organism: Secondary | ICD-10-CM | POA: Diagnosis not present

## 2021-08-04 HISTORY — DX: Thoracic aortic ectasia: I77.810

## 2021-08-04 LAB — SEDIMENTATION RATE: Sed Rate: >140 mm/hr — ABNORMAL HIGH (ref 0–16)

## 2021-08-04 LAB — CBC WITH DIFFERENTIAL/PLATELET
Abs Immature Granulocytes: 0.11 10*3/uL — ABNORMAL HIGH (ref 0.00–0.07)
Basophils Absolute: 0 10*3/uL (ref 0.0–0.1)
Basophils Relative: 1 %
Eosinophils Absolute: 0.1 10*3/uL (ref 0.0–0.5)
Eosinophils Relative: 2 %
HCT: 28.3 % — ABNORMAL LOW (ref 39.0–52.0)
Hemoglobin: 9.4 g/dL — ABNORMAL LOW (ref 13.0–17.0)
Immature Granulocytes: 2 %
Lymphocytes Relative: 4 %
Lymphs Abs: 0.3 10*3/uL — ABNORMAL LOW (ref 0.7–4.0)
MCH: 32.1 pg (ref 26.0–34.0)
MCHC: 33.2 g/dL (ref 30.0–36.0)
MCV: 96.6 fL (ref 80.0–100.0)
Monocytes Absolute: 0.6 10*3/uL (ref 0.1–1.0)
Monocytes Relative: 9 %
Neutro Abs: 5.1 10*3/uL (ref 1.7–7.7)
Neutrophils Relative %: 82 %
Platelets: 203 10*3/uL (ref 150–400)
RBC: 2.93 MIL/uL — ABNORMAL LOW (ref 4.22–5.81)
RDW: 18.4 % — ABNORMAL HIGH (ref 11.5–15.5)
WBC: 6.3 10*3/uL (ref 4.0–10.5)
nRBC: 0 % (ref 0.0–0.2)

## 2021-08-04 LAB — ECHOCARDIOGRAM COMPLETE
Area-P 1/2: 4.21 cm2
Height: 73.5 in
S' Lateral: 2.6 cm
Weight: 3545 oz

## 2021-08-04 LAB — BASIC METABOLIC PANEL
Anion gap: 6 (ref 5–15)
BUN: 12 mg/dL (ref 8–23)
CO2: 25 mmol/L (ref 22–32)
Calcium: 8.1 mg/dL — ABNORMAL LOW (ref 8.9–10.3)
Chloride: 98 mmol/L (ref 98–111)
Creatinine, Ser: 0.67 mg/dL (ref 0.61–1.24)
GFR, Estimated: >60 mL/min (ref >60)
Glucose, Bld: 107 mg/dL — ABNORMAL HIGH (ref 70–99)
Potassium: 3.9 mmol/L (ref 3.5–5.1)
Sodium: 129 mmol/L — ABNORMAL LOW (ref 135–145)

## 2021-08-04 LAB — PROCALCITONIN: Procalcitonin: 0.31 ng/mL

## 2021-08-04 LAB — MAGNESIUM: Magnesium: 1.8 mg/dL (ref 1.7–2.4)

## 2021-08-04 MED ORDER — SODIUM CHLORIDE 0.9 % IV SOLN
2.0000 g | Freq: Three times a day (TID) | INTRAVENOUS | Status: DC
  Administered 2021-08-04 – 2021-08-06 (×6): 2 g via INTRAVENOUS
  Filled 2021-08-04 (×7): qty 2

## 2021-08-04 MED ORDER — METRONIDAZOLE 500 MG/100ML IV SOLN: 500.0000 mg | Freq: Two times a day (BID) | INTRAVENOUS | Status: DC

## 2021-08-04 MED ORDER — PREDNISONE 20 MG PO TABS: 20.0000 mg | ORAL_TABLET | Freq: Every day | ORAL | Status: DC

## 2021-08-04 MED ORDER — PREDNISONE 50 MG PO TABS
100.0000 mg | ORAL_TABLET | Freq: Every day | ORAL | Status: DC
  Administered 2021-08-04 – 2021-08-12 (×9): 100 mg via ORAL
  Filled 2021-08-04 (×9): qty 2

## 2021-08-04 MED ORDER — MAGNESIUM SULFATE 4 GM/100ML IV SOLN
4.0000 g | Freq: Once | INTRAVENOUS | Status: AC
  Administered 2021-08-04: 4 g via INTRAVENOUS
  Filled 2021-08-04: qty 100

## 2021-08-04 NOTE — Progress Notes (Signed)
  Echocardiogram 2D Echocardiogram has been performed.  Corey [name] 08/04/2021, 1:31 PM

## 2021-08-04 NOTE — Progress Notes (Signed)
Inpatient Rehab Admissions Coordinator:    I do not have insurance auth or a bed on CIR for this pt. Today but will follow for potential admit pending insurance auth or bed availability.   Clemens Catholic, Faith, [place] Admissions Coordinator  [phone] ([place]) [phone] (office)

## 2021-08-04 NOTE — Progress Notes (Signed)
Pt refused CPT via vest.

## 2021-08-04 NOTE — Progress Notes (Signed)
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     Molecular Studies by [employer]: [id number] 0.5%   Olaparib Yes [id number] 50.9%   Olaparib Yes [id number] 0.8% None      PDL1: 30%   PRIOR THERAPY: 1) Palliative radiotherapy to the obstructive lung mass in the left lung under the care of Dr. MTammi Klippel Last treatment 03/23/21   CURRENT THERAPY: Palliative systemic chemotherapy with carboplatin AUC 5, Alimta 500 mg per metered squared, Keytruda 200 mg IV every 3 weeks.  First dose expected on 03/31/21. Status post 6 cycles.  Starting from cycle #5 the patient is on maintenance treatment with Alimta 500 Mg/M2 and Keytruda 200 Mg IV every 3 weeks.  Subjective: The patient is seen and examined today.  He continues to have significant shortness of breath and he is currently on home oxygen.  The patient has been on treatment with systemic chemotherapy with carboplatin, Alimta and Keytruda initially for 4 cycles followed by 2 cycles of maintenance treatment with Alimta and Keytruda.  Has been tolerating this treatment well except for the chemotherapy-induced anemia.  The patient was admitted to the hospital with significant shortness of breath.  CT scan of the chest, abdomen pelvis were performed recently.  He has no current fever or chills.  He has no nausea, vomiting, diarrhea or constipation.  Objective: Vital signs in last 24 hours: Temp:  [98.8 F (37.1 C)-99.1 F (37.3 C)] 99.1 F (37.3 C) (11/01 0438) Pulse Rate:  [96-107] 96 (11/01 0438) Resp:  [18-20] 20 (11/01 0438) BP: (104-125)/(71-75) 125/75 (11/01 0438) SpO2:  [94 %-97 %] 95 % (11/01 0758) Weight:  [221 lb 9 oz (100.5 kg)] 221 lb 9 oz (100.5 kg) (11/01  0443)  Intake/Output from previous day: 10/31 0701 - 11/01 0700 In: 690 [P.O.:240; IV Piggyback:450] Out: 3975 [Urine:3975] Intake/Output this shift: No intake/output data recorded.  General appearance: alert, cooperative, fatigued, and no distress Resp: rales bilaterally Cardio: regular rate and rhythm, S1, S2 normal, no murmur, click, rub or gallop GI: soft, non-tender; bowel sounds normal; no masses,  no organomegaly Extremities: extremities normal, atraumatic, no cyanosis or edema  Lab Results:  Recent Labs    08/02/21 0321 08/03/21 0315  WBC 6.3 6.2  HGB 7.0* 8.9*  HCT 21.6* 26.7*  PLT 146* 148*   BMET Recent Labs    08/02/21 0321 08/03/21 0315  NA 129* 129*  K 3.4* 3.6  CL 99 97*  CO2 23 25  GLUCOSE 94 106*  BUN 10 12  CREATININE 0.61 0.63  CALCIUM 8.1* 8.3*    Studies/Results: DG CHEST PORT 1 VIEW  Result Date: 08/03/2021 CLINICAL DATA:  Hypoxia EXAM: PORTABLE CHEST 1 VIEW COMPARISON:  Chest x-ray 08/01/2021 FINDINGS: Cardiomediastinal silhouette is unchanged. Cardiomegaly. Calcified plaques in the aortic arch. Left-sided atrial loop recorder and right-sided central venous port are stable. Mildly increased extensive ground-glass alveolar opacities throughout the right lung since previous study. No significant change in extensive irregular consolidative opacities throughout the left lung. No pneumothorax. IMPRESSION: Extensive bilateral pneumonia as seen previously, with increasing infiltrates on the right since previous study. Electronically Signed   By: DOfilia NeasM.D.   On: 08/03/2021 09:09    Medications: I have reviewed the patient's current medications.    Assessment/Plan: This is a  very pleasant 75 years old white male recently diagnosed with a stage IV non-small cell lung cancer, adenocarcinoma with no actionable mutation and PD-L1 expression of 30%.  The patient is status post palliative radiotherapy to the obstructive left lung mass followed  by systemic chemotherapy with induction carboplatin, Alimta and Keytruda followed by 2 cycles of maintenance Alimta and Keytruda. He was admitted to the hospital with significant respiratory distress and CT scan of the chest, abdomen and pelvis performed recently showed significant opacities bilaterally concerning for drug-induced pneumonitis most likely from [facility] known to cause immune therapy mediated pneumonitis. I recommended for the patient to start high-dose steroids with prednisone or equivalent 1 mg/KG daily for at least 2 weeks followed by a tapered dose of prednisone. I will change his chemotherapy treatment after discharge from the hospital and likely will discontinue Keytruda from his regimen. Will start the patient on this treatment in the hospital and continue to taper it closely at home. For the anemia he has history of radiotherapy to the prostate in the past and likely affected his bone marrow in addition to the chemotherapy-induced anemia.  The patient received 2 units of PRBCs transfusion.  His hemoglobin was 8.9 yesterday. Thank you for taking good care of Mr. Thrush, I will continue to follow-up the patient with you and assist in his management on as-needed basis.   LOS: 6 days    Eilleen Kempf 08/04/2021

## 2021-08-04 NOTE — Progress Notes (Signed)
PT wore BiPAP as ordered by CCM during day from 1120- 1330. PT is now on 4 lpm nasal cannula.

## 2021-08-04 NOTE — Consult Note (Addendum)
NAME:  Corey [name], MRN:  [id number], DOB:  12-13-1945, LOS: 6 ADMISSION DATE:  07/28/2021, CONSULTATION DATE:  08/03/2021 REFERRING MD:  Dr Irine Seal, CHIEF COMPLAINT:  acute hypoxemic respiratory failure   BRIEF   75 year old male with non-small cell lung cancer stage IV, [details below], OSA, A. fib, coronary artery disease.  Some 10-14 days prior to admission was treated for pneumonia as an outpatient with Levaquin.  Unclear at this point many days of antibiotics he took.  Looks like he finished the course and then 1 week prior to admission the day after he stopped the antibiotics he developed lower extremity cellulitis associated with fatigue and worsening fever and cough.  Admission x-ray showed left lower lobe opacity [no fevers] white count normal.  Left lower extremity cellulitis documented.  Started on antibiotics.  Admitted 07/29/2021  Course in the hospital - It appears that his left lower extremity cellulitis started improving.  But he developed intractable nausea and vomiting in the evening of admission 07/29/2021 and the following day on 07/30/2021.  And then he developed new onset hypoxemia evening of 07/23/2021 requiring up to 8 L high flow nasal cannula by 08/01/2021.  Treated with IV Lasix with some improvement to 4 L nasal cannula.  Antibiotics are being continued.  There is concern for aspiration.  Speech-language pathologist evaluation 08/01/2021 noted that patient did not cough during MBS but he did have some retrograde propulsion of the liquid to thoracic esophagus questioning dysmotility and aspiration.  On 08/03/2021: Persistent hypoxemia 5 L nasal cannula.  Afebrile but low grade fever 2 days prior.   He had his brother states this hypoxemia is categorically new.  Review of his CT scan visualization of the images shows that even August 2022 he had left lower lobe consolidation with air bronchogram.  Currently on admission 07/23/2021 this process appears to be  worse.  Brother in here worried about extreme physical deconditioning. Pertinent  Medical History  Oncology history-last seen Dr. 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 He has no actionable mutation and PD-L1 expression of 30%. He is status post palliative radiotherapy to the obstructive lung mass in the left lung under the care of Dr. Tammi Klippel.  The patient is currently undergoing systemic chemotherapy with carboplatin initially for AUC of 5, Alimta 500 Mg/M2 and Keytruda 200 Mg IV every 3 weeks status post 5 cycles.  Starting from cycle #2 the patient is on treatment with carboplatin for AUC of 4 and Alimta 400 Mg/M2.  Starting from cycle #5 the patient is on maintenance treatment with Alimta and Keytruda every 3 weeks    DIAGNOSIS: Stage IV (T1b, N2, M1c) non-small cell lung cancer favoring adenocarcinoma presented with left lung mass in addition to left hilar adenopathy and malignant left pleural effusion, intrathoracic and upper abdominal lymph nodes, left pleural/subpleural nodularity and hepatic lesions diagnosed in May 2022.     Molecular Studies by [employer]: [id number] 0.5%   Olaparib Yes [id number] 50.9%   Olaparib Yes [id number] 0.8% None      PDL1: 30%   PRIOR THERAPY: 1) Palliative radiotherapy to the obstructive lung mass in the left lung under the care of Dr. MTammi Klippel Last treatment  03/23/21   CURRENT THERAPY: Palliative systemic chemotherapy with carboplatin AUC 5, Alimta 500 mg per metered squared, Keytruda 200 mg IV every 3 weeks.  First dose expected on 03/31/21. Status post 5 cycles.  Starting from cycle #5 the  patient is on maintenance treatment with Alimta 500 Mg/M2 and Keytruda 200 Mg IV every 3 weeks.    has a past medical history of Anemia, Anxiety, Arthritis, Carotid artery disease ([place]) (08/30/2016), Chronic lower back pain, Coronary atherosclerosis of native coronary artery, Diverticulitis, Dyslipidemia, Dyspnea, Essential hypertension, benign, Family history of breast cancer, Family history of lung cancer, Family history of prostate cancer, GERD (gastroesophageal reflux disease), Lumbar radiculopathy, chronic (02/04/2015), Obesity, OSA on CPAP, Paroxysmal atrial fibrillation ([place]), Pneumonia (01/1996), PONV (postoperative nausea and vomiting), Prostate CA ([place]), Recurrent upper respiratory infection (URI), Stroke ([place]) (07/2012), and Visit for monitoring Tikosyn therapy (09/18/2019).   has a past surgical history that includes Anterior cervical decomp/discectomy fusion (07/2001; 10/2002); Posterior lumbar fusion (10/2003); Knee arthroscopy (Left, 10/2005); Total knee arthroplasty (Left, 10/2006); TEE without cardioversion (07/07/2012); Shoulder arthroscopy (Right, 08/2011); Tonsillectomy and adenoidectomy (~ 1956); Lumbar laminectomy/decompression microdiscectomy (03/2005); Knee arthroscopy w/ partial medial meniscectomy (Left, 09/2005); Shoulder open rotator cuff repair (Left, 07/2014); Colonoscopy w/ polypectomy (02/2014); Back surgery; Carpal tunnel release (Left, 10/2015); Trigger finger release (Left, 10/2015); Joint replacement; Coronary angioplasty with stent (05/2003; 12/2005); HAND SURGERY (02/2019); implantable loop recorder placement (02/27/2020); Adenoidectomy; Thoracentesis (N/A, 02/25/2021); Video bronchoscopy with endobronchial ultrasound (Left, 02/26/2021); Fine needle aspiration (02/26/2021); Chest tube insertion (Left, 03/17/2021); and IR IMAGING GUIDED PORT INSERTION (04/16/2021).   Significant Hospital Events: Including procedures, antibiotic start and stop dates in addition to other pertinent events    07/28/2021 - admit 10/31- ccm consult  Interim History / Subjective:   11/1 - used BiPAP as advised. STarted on prednisone 141m per day poral by DR MJulien Nordmannfor suspected kBosnia and Herz[place]inapneumonitis(ESR > 140, PCT 0.31). On 4L [place]. Also completed ceftriaxone 7d. On flaguyl. QUant gold pending. He is still worried about extreme physical deconditioning since admit. REports signfiicant dyspnea/fatigue doing simple stuff  Objective   Blood pressure 138/81, pulse (!) 101, temperature 98.4 F (36.9 C), temperature source Oral, resp. rate 18, height 6' 1.5" (1.867 m), weight 100.5 kg, SpO2 96 %.        Intake/Output Summary (Last 24 hours) at 08/04/2021 1555 Last data filed at 08/04/2021 1500 Gross per 24 hour  Intake 818.32 ml  Output 2350 ml  Net -1531.68 ml   Filed Weights   08/02/21 0531 08/03/21 0617 08/04/21 0443  Weight: 102.2 kg 102.8 kg 100.5 kg    General Appearance:  Looks stble Head:  Normocephalic, without obvious abnormality, atraumatic Eyes:  PERRL - yes, conjunctiva/corneas - clear     Ears:  Normal external ear canals, both ears Nose:  G tube - no but has [place] 4L  Throat:  ETT TUBE - no , OG tube - no Neck:  Supple,  No enlargement/tenderness/nodules Lungs: Clear to auscultation bilaterally but at base has crackles Heart:  S1 and S2 normal, no murmur, CVP - no.  Pressors - no Abdomen:  Soft, no masses, no organomegaly Genitalia / Rectal:  Not done Extremities:  Extremities- intact Skin:  ntact in exposed areas . Sacral area - not examiend Neurologic:  Sedation - none -> RASS - +1 . Moves all 4s - yes. CAM-ICU - neg . Orientation - x3+      LABS    PULMONARY Recent Labs  Lab 08/01/21 1530  PHART 7.411  PCO2ART 31.9*  PO2ART 114*  HCO3 19.9*  O2SAT 99.0    CBC Recent Labs  Lab 08/02/21 0321 08/03/21 0315 08/04/21 0917  HGB 7.0* 8.9* 9.4*  HCT 21.6* 26.7* 28.3*  WBC 6.3 6.2 6.3  PLT 146* 148* 203    COAGULATION No results for input(s): INR in the  last 168 hours.   CARDIAC  No results for input(s): TROPONINI in the last 168 hours. No results for input(s): PROBNP in the last 168 hours.   CHEMISTRY Recent Labs  Lab 07/31/21 0400 08/01/21 0457 08/02/21 0321 08/03/21 0315 08/04/21 0917  NA 133* 130* 129* 129* 129*  K 4.1 3.9 3.4* 3.6 3.9  CL 103 104 99 97* 98  CO2 21* 21* _0 GLUCOSE 90 91 94 106* 107*  BUN _1 CREATININE 0.58* 0.64 0.61 0.63 0.67  CALCIUM 8.1* 8.0* 8.1* 8.3* 8.1*  MG 1.8 1.7 1.8 1.7 1.8  PHOS 2.3*  --   --   --   --    Estimated Creatinine Clearance: 101.9 mL/min (by C-G formula based on SCr of 0.67 mg/dL).   LIVER Recent Labs  Lab 07/29/21 0648 07/30/21 0500 07/31/21 0400  AST 27 38  --   ALT 15 14  --   ALKPHOS 49 54  --   BILITOT 0.8 0.8  --   PROT 6.4* 6.7  --   ALBUMIN 2.2* 2.3* 1.8*     INFECTIOUS Recent Labs  Lab 08/04/21 0917  PROCALCITON 0.31     ENDOCRINE CBG (last 3)  No results for input(s): GLUCAP in the last 72 hours.       IMAGING x48h  - image(s) personally visualized  -   highlighted in bold DG CHEST PORT 1 VIEW  Result Date: 08/03/2021 CLINICAL DATA:  Hypoxia EXAM: PORTABLE CHEST 1 VIEW COMPARISON:  Chest x-ray 08/01/2021 FINDINGS: Cardiomediastinal silhouette is unchanged. Cardiomegaly. Calcified plaques in the aortic arch. Left-sided atrial loop recorder and right-sided central venous port are stable. Mildly increased extensive ground-glass alveolar opacities throughout the right lung since previous study. No significant change in extensive irregular consolidative opacities throughout the left lung. No pneumothorax. IMPRESSION: Extensive bilateral pneumonia as seen previously, with increasing infiltrates on the right since previous study. Electronically Signed   By: Ofilia Neas M.D.   On: 08/03/2021 09:09      Resolved Hospital Problem list   x  Assessment & Plan:  Chronic left lower lobe atelectasis/consolidation - Prior to &  Present on Admit -seen on CT scan chest May 2022  Acute hypoxemic respiratory failure-in the setting of cellulitis and also potential aspiration.  - ALI due to cellulitiis + aspiration - likely factors bu with ESR  > 140 and timing onc started steroids for keytruda pneumonitis on 11/1/122. CAP ruled out   Plan - Antibiotics as below (change flagyl to cefepime for total anaerobic coverage 7d, dc ceftriaxone) - Await QuantiFERON gold - O2 for pulse ox > 92% - DC day time bipap later 08/04/21 - Cotninue night biPAP for now - Prednison 128m daily start 08/04/21 pe Dr MBretta Bang check G6PD and if normal start bactrim for PCP prophylaxis - Might need LTAC due to o2, prednisone and deconditioning (care mamagnent consult called) - will need ccm folllowup as opd at [facility] - ccm will round 08/06/21  - Antibiotics Anti-infectives (From admission, onward)    Start     Dose/Rate Route Frequency Ordered Stop   08/04/21 2000  metroNIDAZOLE (  FLAGYL) IVPB 500 mg  Status:  Discontinued        500 mg 100 mL/hr over 60 Minutes Intravenous Every 12 hours 08/04/21 1413 08/04/21 1621   08/04/21 1500  ceFEPIme (MAXIPIME) 2 g in sodium chloride 0.9 % 100 mL IVPB        2 g 200 mL/hr over 30 Minutes Intravenous Every 8 hours 08/04/21 1413     08/03/21 1000  fluconazole (DIFLUCAN) tablet 200 mg  Status:  Discontinued        200 mg Oral Daily 08/03/21 0739 08/04/21 1347   08/02/21 2000  metroNIDAZOLE (FLAGYL) IVPB 500 mg  Status:  Discontinued        500 mg 100 mL/hr over 60 Minutes Intravenous Every 12 hours 08/02/21 1557 08/04/21 1413   08/02/21 1300  metroNIDAZOLE (FLAGYL) IVPB 500 mg  Status:  Discontinued        500 mg 100 mL/hr over 60 Minutes Intravenous Every 12 hours 08/02/21 1150 08/02/21 1557   08/02/21 1000  fluconazole (DIFLUCAN) IVPB 200 mg  Status:  Discontinued        200 mg 100 mL/hr over 60 Minutes Intravenous Every 24 hours 08/02/21 0827 08/03/21 0739   08/01/21 1000  azithromycin  (ZITHROMAX) tablet 500 mg  Status:  Discontinued        500 mg Oral Daily 07/31/21 1438 08/03/21 1111   07/30/21 1000  azithromycin (ZITHROMAX) 500 mg in sodium chloride 0.9 % 250 mL IVPB  Status:  Discontinued        500 mg 250 mL/hr over 60 Minutes Intravenous Every 24 hours 07/30/21 0755 07/31/21 1438   07/30/21 0000  vancomycin (VANCOCIN) IVPB 1000 mg/200 mL premix  Status:  Discontinued        1,000 mg 200 mL/hr over 60 Minutes Intravenous Every 12 hours 07/29/21 0854 07/31/21 1436   07/29/21 1030  vancomycin (VANCOREADY) IVPB 2000 mg/400 mL        2,000 mg 200 mL/hr over 120 Minutes Intravenous  Once 07/29/21 0844 07/29/21 1447   07/29/21 1000  cefTRIAXone (ROCEPHIN) 2 g in sodium chloride 0.9 % 100 mL IVPB  Status:  Discontinued        2 g 200 mL/hr over 30 Minutes Intravenous Every 24 hours 07/29/21 0832 08/04/21 1413   07/28/21 1600  vancomycin (VANCOCIN) IVPB 1000 mg/200 mL premix        1,000 mg 200 mL/hr over 60 Minutes Intravenous  Once 07/28/21 1545 07/28/21 1800   07/28/21 1600  ceFEPIme (MAXIPIME) 2 g in sodium chloride 0.9 % 100 mL IVPB        2 g 200 mL/hr over 30 Minutes Intravenous  Once 07/28/21 1545 07/28/21 1701   07/28/21 1400  ceFAZolin (ANCEF) IVPB 2g/100 mL premix  Status:  Discontinued        2 g 200 mL/hr over 30 Minutes Intravenous Every 8 hours 07/28/21 1334 07/28/21 1545        Xxxx   CCM   Best Practice (right click and "Reselect all SmartList Selections" daily)  According to the hospitalist [facility] Future Appointments  Date Time Provider [place]  08/17/2021 11:30 AM [facility] DEVICE REMOTES [facility]  08/19/2021  9:30 AM Brand Males, MD [facility] None  08/19/2021  2:00 PM [facility] NL VASC 2 [facility]  08/25/2021  8:45 AM [facility] [place] FLUSH [facility] None  08/25/2021  9:15 AM Curt Bears, MD [facility] None  08/25/2021 10:30 AM [facility] INFUSION [facility] None  09/15/2021 10:30 AM [facility]  [place] FLUSH [facility] None  09/15/2021 11:00 AM Curt Bears, MD [facility] None  09/15/2021 11:45 AM [facility] INFUSION [facility] None  09/21/2021 11:30 AM [facility] DEVICE REMOTES [facility]  10/26/2021 11:30 AM [facility] DEVICE REMOTES [facility]  11/30/2021 11:30 AM [facility] DEVICE REMOTES [facility]  12/11/2021 10:45 AM Tat, Eustace Quail, DO [facility] None  01/04/2022 11:30 AM [facility] DEVICE REMOTES [facility]        ATTESTATION & SIGNATURE  Dr. Brand Males, M.D., [facility].C.P Pulmonary and Critical Care Medicine Staff Physician [facility] Pager: [phone], If no answer or between  15:00h - 7:00h: call [phone]  08/04/2021 3:55 PM

## 2021-08-04 NOTE — Progress Notes (Signed)
PT wore BiPAP as ordered by CCM during day from [id number]. PT is now on 4 LPM nasal cannula.

## 2021-08-04 NOTE — Plan of Care (Signed)

## 2021-08-04 NOTE — Progress Notes (Signed)
PROGRESS NOTE    Corey [name]  KDT:[id number] DOB: June 09, 1946 DOA: 07/28/2021 PCP: [name], MD   Chief Complaint  Patient presents with   Fatigue   Shortness of Breath    Brief Narrative:  Patient pleasant 76 year old gentleman history of non-small cell lung cancer on chemotherapy, OSA, A. fib, coronary artery disease presented with left lower extremity swelling and generalized weakness.  Patient noted to have recently been treated with Levaquin for pneumonia which she completed the day prior to his erythema.  Patient presented with shortness of breath from pneumonia.  Chest x-ray done showed unchanged left lower lobe opacity, no fevers recorded, patient on also noted with a left lower extremity cellulitis.  Patient placed on IV vancomycin cefepime and admitted for further evaluation and management.  Patient noted the night of admission 12 intractable nausea and vomiting and abdominal films and chest x-ray ordered.   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 -Was on IV vancomycin and IV Rocephin.   -IV vancomycin discontinued.   -Blood cultures with no growth to date.   -Was on IV Rocephin which has been changed to cefepime due to problem #2.   -Significant clinical  improvement.   -Cellulitis has been treated with a full course of antibiotics at this time. -Supportive care.  2.  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 -It is noted that patient had a sats of 77% on room air the evening on 07/31/2021, patient noted to have required up to 8 L high flow nasal cannula on 08/01/2021.   -BNP at 24.3.  -ABG with a pH of 7.4/PCO2 of 32/PO2 of 114/bicarb of 20.   -Patient placed on Lasix 40 mg IV every 12 hours with urine output of 2.5 L. -Some initial clinical improvement on 08/02/2021 however patient with worsening hypoxia 08/03/2021 with sats of 95% on 6 L nasal cannula laying supine, when patient moved to edge of bed sats of 83% on 6 L with some complaints of dizziness.  -Sputum gram stain and culture with rare Candida albicans, viridans Streptococcus.. -Sed rate > 140.  -Patient seen in consultation by PCCM who have recommended changing Flagyl to cefepime for total anaerobic coverage of 7 days, discontinuation of IV Rocephin, discontinuation of Diflucan. -PCCM in agreement with oncology who have started patient on steroids for Keytruda pneumonitis 08/04/2021. -PCCM ordered a G6PD and if normal to start patient on Bactrim for PCP prophylaxis, awaiting QuantiFERON gold as well. -Continue scheduled nebs. -Continue BiPAP nightly. -PCCM and oncology following and appreciate input and recommendations.  3.  Nausea vomiting -Patient noted to have intractable nausea  vomiting the evening of 07/29/2021, morning of 07/30/2021 . -Patient placed on IV Compazine and IV Zofran with clinical improvement.  -Patient stated had a bowel movement.   -Improving clinically with no further nausea or vomiting and tolerating clear liquids. -Abdominal films with nonobstructive bowel gas  pattern, mild gaseous distention of the stomach.   -Chest x-ray with new faint patchy opacity right midlung, differential includes mild atelectasis, aspiration or pneumonia.  Stable volume loss and patchy consolidation in the mid to lower left lung compatible with posttreatment change. -Patient initially placed on n.p.o. pending abdominal films, abdominal films with no obstructive pattern. -Patient currently tolerating dysphagia 3 diet.   -Seen by SLP and underwent MBS.   -Supportive care.    4.  aspiration pneumonia/CAP versus BOOP/drug-induced pneumonitis.  -Patient noted with intractable nausea and vomiting the evening of 07/29/2021 and in the morning of 07/30/2021.  -Repeat chest x-ray done 08/01/2021 with patchy AICD in the right lung corresponds to groundglass opacities on CT, with mild interval progression.  Chronic volume loss in the left hemothorax with pleural parenchymal opacity in the left lower lung.  -MRSA PCR negative.   -Sputum gram stain and culture with rare Candida albicans, rare variant Streptococcus. -IV vancomycin discontinued.    -Due to acute respiratory failure with hypoxia and worsening chest x-ray with concerns for aspiration pneumonia antibiotics was broadened with addition of IV Flagyl to IV Rocephin and IV azithromycin.   -Patient status post course of azithromycin. -Patient with worsening ongoing hypoxia with sats in the 83% on 6 L nasal cannula with exertion as noted on PT note on 08/03/2021.   -Repeat chest x-ray with extensive bilateral pneumonia as previously seen with increasing infiltrates on the right since prior study.  -Continue Mucinex, Brovana, Flonase, Brovana, Pulmicort. -Patient seen by PCCM who have recommended a change of Flagyl to cefepime for total antibiotic coverage of 7 days, discontinuation of IV Rocephin. -PCCM following and appreciate input and recommendations.  5.  Hypokalemia/hypomagnesemia/hyponatremia -Improving with  hydration. -Potassium at 3.9, magnesium at 1.8.   -Magnesium sulfate 4 g IV x1.   -Patient on Tikosyn and as such goal is to keep potassium around 4, magnesium at 2.  -Repeat labs in the morning.    6.  Stage IV non-small cell lung cancer -On palliative chemotherapy per oncology, Dr. Lorna Few. -Continue nebs/inhalers. -Patient's oncologist ordered staging CT scans on 07/31/2021.   -Oncology informed of admission via epic. -Patient seen by oncology today 08/04/2021. -Concerned that patient may have Keytruda induced pneumonitis and as such this will not be resumed on discharge per oncology. -Per oncology  7.  Paroxysmal atrial fibrillation -Patient with intractable nausea vomiting unable to tolerate any oral intake early on during the hospitalization which has since resolved. -Patient transitioned from IV Lopressor to oral home dose Lopressor.  -Continue Tikosyn.   -Keep potassium approximately 4, keep magnesium approximately 2.   -Was on full dose Lovenox and transitioned back to home dose Xarelto.   -Supportive care.    8.  Hyperlipidemia -Lipitor, fenofibrate.   9.  Coronary artery disease/hypertension -IV metoprolol has been transition back to home dose oral metoprolol.  Continue statin, anticoagulation.   10.  Parkinson's -Sinemet  11.  Prolonged QT -Repeat EKG with resolution of QT prolongation currently at 467 from 530 on admission. -Magnesium currently at 1.8, potassium at 3.9.   -Magnesium sulfate 4 g IV x1. -Keep potassium approximately at 4, magnesium approximately 2. -Follow.  12.  OSA -Nausea and vomiting  improved no further nausea and vomiting noted.   -Was on CPAP nightly however due to respiratory issues has been placed on BiPAP.  13.  History of CVA -No overt bleeding.  -Xarelto for secondary stroke prophylaxis.   -Statin.   14.  Generalized weakness -PT/OT. -PT recommending SNF however patient and family would prefer to go home with home health  therapies. -Patient assessed by CIR and evaluation pending to see whether patient is an appropriate candidate. -TOC consulted for home health therapies.  15.  Anemia -Likely secondary to chemotherapy. -Patient with no overt bleeding. -Hemoglobin at 7.0 on 08/02/2021. -Status post transfusion 2 units packed red blood cells 08/02/2021 with hemoglobin stable at 9.4 -Transfusion threshold hemoglobin < 7. -Follow H&H.   DVT prophylaxis: Xarelto.  Code Status: DNR Family Communication: Updated patient, updated brother at bedside.   Disposition:   Status is: Inpatient  Remains inpatient appropriate because: Inpatient treatment necessary.  Patient noted to have nausea and vomiting overnight with inability to keep anything down.       Consultants:  Oncology informed of admission via epic. PCCM: Dr. Chase Caller 08/03/2021  Procedures:  Abdominal x-ray 07/30/2021 Chest x-ray 07/30/2021, 08/01/2021 Bilateral lower extremity Dopplers 07/30/2021 CT chest/CT abdomen and pelvis 07/31/2021 Transfused 2 units packed red blood cells 08/02/2021  Antimicrobials:  IV azithromycin 07/30/2021>>>>>> 08/01/2021 Oral azithromycin 08/01/2021>>>> 08/03/2021 IV cefepime 1025 2022x1 dose IV Rocephin 07/29/2021>>>>> 08/04/2021 IV vancomycin 07/28/2021>>>>>> 07/31/2021 IV Flagyl 08/02/2021>>>> 08/04/2021 IV Diflucan 08/02/2021>>>> 08/03/2021 Oral Diflucan 08/03/2021>>>> 08/04/2021 IV cefepime 08/04/2021>>>>>    Subjective: Patient getting placed back on Bipap.  Patient states no significant change in shortness of breath over the past 24 hours.  Complain of some right eye blurry vision.  Denies any right eye pain.  Wife at bedside stated that there was supposed to follow-up with ophthalmology this summer however due to hospitalizations has not had a chance to.  States saw his oncologist, Dr. Lorna Few this morning.  No nausea or emesis.  Tolerating current diet.   Objective: Vitals:    08/04/21 0438 08/04/21 0443 08/04/21 0758 08/04/21 1127  BP: 125/75     Pulse: 96   (!) 108  Resp: 20   19  Temp: 99.1 F (37.3 C)     TempSrc: Oral     SpO2: 94%  95% 92%  Weight:  100.5 kg    Height:        Intake/Output Summary (Last 24 hours) at 08/04/2021 1135 Last data filed at 08/04/2021 0830 Gross per 24 hour  Intake 590 ml  Output 2850 ml  Net -2260 ml    Filed Weights   08/02/21 0531 08/03/21 0617 08/04/21 0443  Weight: 102.2 kg 102.8 kg 100.5 kg    Examination:  General exam: NAD. Respiratory system: Some scattered coarse breath sounds on the right.  Decreased breath sounds in the left base.  Some scattered rhonchi.  Fair air movement.  Cardiovascular system: Regular rate and rhythm no murmurs rubs or gallops.  No JVD.  No lower extremity edema.  Gastrointestinal system: Abdomen is soft, nontender, nondistended, positive bowel sounds.  No rebound.  No guarding.  Central nervous system: Alert and oriented. No focal neurological deficits. Extremities: Left lower extremity with demarcated cellulitis with significant improvement.  No warmth noted.  Nontender to palpation. Skin: No rashes, lesions or ulcers Psychiatry: Judgement and insight appear normal. Mood & affect appropriate.     Data Reviewed: I have personally reviewed following labs and imaging studies  CBC: Recent Labs  Lab 07/31/21 0400 08/01/21 0457 08/02/21 0321 08/03/21 0315 08/04/21 0917  WBC 7.8 6.4 6.3 6.2 6.3  NEUTROABS 6.7 5.4 5.3 5.3 5.1  HGB 7.3* 7.3* 7.0* 8.9* 9.4*  HCT 23.2* 22.2* 21.6* 26.7* 28.3*  MCV 97.5 96.5 96.0 94.0 96.6  PLT 128* 140* 146* 148* 203     Basic Metabolic Panel: Recent Labs  Lab 07/31/21 0400 08/01/21 0457 08/02/21 0321 08/03/21 0315 08/04/21 0917  NA 133* 130* 129* 129* 129*  K 4.1 3.9 3.4* 3.6 3.9  CL 103 104 99 97* 98  CO2 21* 21* 23 25 25   GLUCOSE 90 91 94 106* 107*  BUN 13 9 10 12 12   CREATININE 0.58* 0.64 0.61 0.63 0.67  CALCIUM 8.1* 8.0*  8.1* 8.3* 8.1*  MG 1.8 1.7 1.8 1.7 1.8  PHOS 2.3*  --   --   --   --      GFR: Estimated Creatinine Clearance: 101.9 mL/min (by C-G formula based on SCr of 0.67 mg/dL).  Liver Function Tests: Recent Labs  Lab 07/29/21 0648 07/30/21 0500 07/31/21 0400  AST 27 38  --   ALT 15 14  --   ALKPHOS 49 54  --   BILITOT 0.8 0.8  --   PROT 6.4* 6.7  --   ALBUMIN 2.2* 2.3* 1.8*     CBG: No results for input(s): GLUCAP in the last 168 hours.   Recent Results (from the past 240 hour(s))  Blood culture (routine single)     Status: None   Collection Time: 07/28/21 11:44 AM   Specimen: BLOOD LEFT ARM  Result Value Ref Range Status   Specimen Description   Final    BLOOD LEFT ARM Performed at [facility], [address], [place], [place] [address]    Special Requests   Final    Blood Culture adequate volume Performed at [facility], [address], [place], [place] [address]    Culture   Final    NO GROWTH 5 DAYS Performed at [facility], [place] [address], [place], [place] [address]    Report Status 08/02/2021 FINAL  Final  Culture, blood (routine x 2)     Status: None   Collection Time: 07/28/21  2:03 PM   Specimen: BLOOD  Result Value Ref Range Status   Specimen Description   Final    BLOOD CENTRAL LINE BLOOD RIGHT ARM Performed at [facility], [address], [place], [place] [address]    Special Requests   Final    BOTTLES DRAWN AEROBIC AND ANAEROBIC Blood Culture adequate volume Performed at [facility], [address] [place], [place] [address]    Culture   Final    NO GROWTH 5 DAYS Performed at [facility], [place] [address] [place], [place] [address]    Report Status 08/02/2021 FINAL  Final  Resp Panel by RT-PCR (Flu A&B, Covid) Nasopharyngeal Swab     Status: None   Collection Time: 07/28/21  3:35 PM   Specimen: Nasopharyngeal Swab; Nasopharyngeal(NP) swabs in vial  transport medium  Result Value Ref Range Status   SARS Coronavirus 2 by RT PCR NEGATIVE NEGATIVE Final    Comment: (NOTE) SARS-CoV-2 target nucleic acids are NOT DETECTED.  The SARS-CoV-2 RNA is generally detectable in upper respiratory specimens during the acute phase of infection. The lowest concentration of SARS-CoV-2 viral copies this assay can detect is 138 copies/mL. A negative result does not preclude SARS-Cov-2 infection and should  not be used as the sole basis for treatment or other patient management decisions. A negative result may occur with  improper specimen collection/handling, submission of specimen other than nasopharyngeal swab, presence of viral mutation(s) within the areas targeted by this assay, and inadequate number of viral copies(<138 copies/mL). A negative result must be combined with clinical observations, patient history, and epidemiological information. The expected result is Negative.  Fact Sheet for Patients:  EntrepreneurPulse.com.au  Fact Sheet for Healthcare Providers:  IncredibleEmployment.be  This test is no t yet approved or cleared by the [place] FDA and  has been authorized for detection and/or diagnosis of SARS-CoV-2 by FDA under an Emergency Use Authorization (EUA). This EUA will remain  in effect (meaning this test can be used) for the duration of the COVID-19 declaration under Section 564(b)(1) of the Act, 21 U.S.C.section 360bbb-3(b)(1), unless the authorization is terminated  or revoked sooner.       Influenza A by PCR NEGATIVE NEGATIVE Final   Influenza B by PCR NEGATIVE NEGATIVE Final    Comment: (NOTE) The Xpert Xpress SARS-CoV-2/FLU/RSV plus assay is intended as an aid in the diagnosis of influenza from Nasopharyngeal swab specimens and should not be used as a sole basis for treatment. Nasal washings and aspirates are unacceptable for Xpert Xpress SARS-CoV-2/FLU/RSV testing.  Fact  Sheet for Patients: EntrepreneurPulse.com.au  Fact Sheet for Healthcare Providers: IncredibleEmployment.be  This test is not yet approved or cleared by the [place] FDA and has been authorized for detection and/or diagnosis of SARS-CoV-2 by FDA under an Emergency Use Authorization (EUA). This EUA will remain in effect (meaning this test can be used) for the duration of the COVID-19 declaration under Section 564(b)(1) of the Act, 21 U.S.C. section 360bbb-3(b)(1), unless the authorization is terminated or revoked.  Performed at [facility], [address] [place], [place] [address]   Expectorated Sputum Assessment w Gram Stain, Rflx to Resp Cult     Status: None   Collection Time: 07/30/21  7:57 AM   Specimen: Sputum  Result Value Ref Range Status   Specimen Description SPUTUM  Final   Special Requests NONE  Final   Sputum evaluation   Final    THIS SPECIMEN IS ACCEPTABLE FOR SPUTUM CULTURE Performed at [facility], [place] [address] [place], [place] [address]    Report Status 07/30/2021 FINAL  Final  Culture, Respiratory w Gram Stain     Status: None   Collection Time: 07/30/21  7:57 AM   Specimen: SPU  Result Value Ref Range Status   Specimen Description   Final    SPUTUM Performed at [place] [address] [place], [place] [address]    Special Requests   Final    NONE Reflexed from [id number] Performed at [place] [address] [place], [place] [address]    Gram Stain   Final    RARE SQUAMOUS EPITHELIAL CELLS PRESENT RARE WBC PRESENT, PREDOMINANTLY PMN RARE GRAM NEGATIVE RODS    Culture   Final    RARE CANDIDA ALBICANS RARE VIRIDANS STREPTOCOCCUS Standardized susceptibility testing for this organism is not available. Performed at [facility], [place] [address], [place], [place] [address]    Report Status 08/01/2021 FINAL  Final  MRSA  Next Gen by PCR, Nasal     Status: None   Collection Time: 07/31/21  8:12 AM   Specimen: Nasal Mucosa; Nasal Swab  Result Value Ref Range Status   MRSA  by PCR Next Gen NOT DETECTED NOT DETECTED Final    Comment: (NOTE) The GeneXpert MRSA Assay (FDA approved for NASAL specimens only), is one component of a comprehensive MRSA colonization surveillance program. It is not intended to diagnose MRSA infection nor to guide or monitor treatment for MRSA infections. Test performance is not FDA approved in patients less than 9 years old. Performed at [facility], [place] [address], [place], [place] [address]           Radiology Studies: DG CHEST PORT 1 VIEW  Result Date: 08/03/2021 CLINICAL DATA:  Hypoxia EXAM: PORTABLE CHEST 1 VIEW COMPARISON:  Chest x-ray 08/01/2021 FINDINGS: Cardiomediastinal silhouette is unchanged. Cardiomegaly. Calcified plaques in the aortic arch. Left-sided atrial loop recorder and right-sided central venous port are stable. Mildly increased extensive ground-glass alveolar opacities throughout the right lung since previous study. No significant change in extensive irregular consolidative opacities throughout the left lung. No pneumothorax. IMPRESSION: Extensive bilateral pneumonia as seen previously, with increasing infiltrates on the right since previous study. Electronically Signed   By: Ofilia Neas M.D.   On: 08/03/2021 09:09        Scheduled Meds:  acidophilus   Oral Daily   arformoterol  15 mcg Nebulization BID   atorvastatin  40 mg Oral Daily   budesonide (PULMICORT) nebulizer solution  0.5 mg Nebulization BID   carbidopa-levodopa  1 tablet Oral 3 times per day   chlorhexidine  15 mL Mouth Rinse BID   Chlorhexidine Gluconate Cloth  6 each Topical Daily   dofetilide  500 mcg Oral BID   fenofibrate  160 mg Oral Daily   fluconazole  200 mg Oral Daily   fluticasone  2 spray Each Nare Daily   folic acid  1 mg Oral Daily   guaiFENesin   1,200 mg Oral BID   ipratropium  1-2 spray Each Nare Q12H   ipratropium  0.5 mg Nebulization TID   levalbuterol  0.63 mg Nebulization TID   magnesium oxide  400 mg Oral Daily   mouth rinse  15 mL Mouth Rinse q12n4p   metoprolol succinate  25 mg Oral Daily   multivitamin with minerals  1 tablet Oral Daily   pantoprazole  40 mg Oral Daily   potassium chloride  10 mEq Oral Daily   predniSONE  100 mg Oral Q breakfast   rivaroxaban  20 mg Oral Q supper   tamsulosin  0.4 mg Oral QPC supper   topiramate  100 mg Oral BID   Continuous Infusions:  cefTRIAXone (ROCEPHIN)  IV 2 g (08/03/21 1401)   magnesium sulfate bolus IVPB     metronidazole 500 mg (08/04/21 0835)     LOS: 6 days    Time spent: 40 minutes    Irine Seal, MD [facility]   To contact the attending provider between 7A-7P or the covering provider during after hours 7P-7A, please log into the web site [email or web address] and access using universal  password for that web site. If you do not have the password, please call the hospital operator.  08/04/2021, 11:35 AM

## 2021-08-05 DIAGNOSIS — I10 Essential (primary) hypertension: Secondary | ICD-10-CM | POA: Diagnosis not present

## 2021-08-05 DIAGNOSIS — L03116 Cellulitis of left lower limb: Secondary | ICD-10-CM | POA: Diagnosis not present

## 2021-08-05 DIAGNOSIS — J9601 Acute respiratory failure with hypoxia: Secondary | ICD-10-CM | POA: Diagnosis not present

## 2021-08-05 LAB — CBC WITH DIFFERENTIAL/PLATELET
Abs Immature Granulocytes: 0.09 10*3/uL — ABNORMAL HIGH (ref 0.00–0.07)
Basophils Absolute: 0 10*3/uL (ref 0.0–0.1)
Basophils Relative: 0 %
Eosinophils Absolute: 0 10*3/uL (ref 0.0–0.5)
Eosinophils Relative: 0 %
HCT: 29.9 % — ABNORMAL LOW (ref 39.0–52.0)
Hemoglobin: 9.7 g/dL — ABNORMAL LOW (ref 13.0–17.0)
Immature Granulocytes: 2 %
Lymphocytes Relative: 2 %
Lymphs Abs: 0.1 10*3/uL — ABNORMAL LOW (ref 0.7–4.0)
MCH: 31.9 pg (ref 26.0–34.0)
MCHC: 32.4 g/dL (ref 30.0–36.0)
MCV: 98.4 fL (ref 80.0–100.0)
Monocytes Absolute: 0.3 10*3/uL (ref 0.1–1.0)
Monocytes Relative: 6 %
Neutro Abs: 4.1 10*3/uL (ref 1.7–7.7)
Neutrophils Relative %: 90 %
Platelets: 191 10*3/uL (ref 150–400)
RBC: 3.04 MIL/uL — ABNORMAL LOW (ref 4.22–5.81)
RDW: 18.6 % — ABNORMAL HIGH (ref 11.5–15.5)
WBC: 4.5 10*3/uL (ref 4.0–10.5)
nRBC: 0 % (ref 0.0–0.2)

## 2021-08-05 LAB — COMPREHENSIVE METABOLIC PANEL
ALT: 13 U/L (ref 0–44)
AST: 31 U/L (ref 15–41)
Albumin: 2.1 g/dL — ABNORMAL LOW (ref 3.5–5.0)
Alkaline Phosphatase: 61 U/L (ref 38–126)
Anion gap: 8 (ref 5–15)
BUN: 19 mg/dL (ref 8–23)
CO2: 24 mmol/L (ref 22–32)
Calcium: 8.8 mg/dL — ABNORMAL LOW (ref 8.9–10.3)
Chloride: 102 mmol/L (ref 98–111)
Creatinine, Ser: 0.7 mg/dL (ref 0.61–1.24)
GFR, Estimated: >60 mL/min (ref >60)
Glucose, Bld: 150 mg/dL — ABNORMAL HIGH (ref 70–99)
Potassium: 4.3 mmol/L (ref 3.5–5.1)
Sodium: 134 mmol/L — ABNORMAL LOW (ref 135–145)
Total Bilirubin: 0.7 mg/dL (ref 0.3–1.2)
Total Protein: 6.8 g/dL (ref 6.5–8.1)

## 2021-08-05 LAB — PROCALCITONIN: Procalcitonin: 0.19 ng/mL

## 2021-08-05 LAB — MAGNESIUM: Magnesium: 2.3 mg/dL (ref 1.7–2.4)

## 2021-08-05 LAB — PHOSPHORUS: Phosphorus: 4.1 mg/dL (ref 2.5–4.6)

## 2021-08-05 MED ORDER — LIP MEDEX EX OINT
TOPICAL_OINTMENT | CUTANEOUS | Status: DC | PRN
  Filled 2021-08-05: qty 7

## 2021-08-05 NOTE — TOC Progression Note (Addendum)
Transition of Care [facility]) - Progression Note    Patient Details  Name: Corey [name] MRN: [id number] Date of Birth: 1945/10/19  Transition of Care [facility]) CM/SW Contact  Jaedon Siler, Juliann Pulse, RN Phone Number: 08/05/2021, 10:01 AM  Clinical Narrative:   CIR currently following for auth-Peer to peer requested-will confirm with attending if agree, & tel# for contact.Note PCCM-recc LTACH as an option-CM received cons. 1:24p-per attending will pursue LTACH if patient in agreement.   Expected Discharge Plan: IP Rehab Facility Barriers to Discharge: Continued Medical Work up  Expected Discharge Plan and Services Expected Discharge Plan: [place]   Discharge Planning Services: CM Consult   Living arrangements for the past 2 months: Single Family Home                           HH Arranged: PT, OT, Social Work [employer] Agency: [place] Date [place]: 07/31/21 Time [place]: 1412 Representative spoke with at [place]: Cherl rose   Social Determinants of Health ([place]) Interventions    Readmission Risk Interventions No flowsheet data found.

## 2021-08-05 NOTE — Progress Notes (Signed)
Inpatient Rehab Admissions Coordinator:   I spoke with pt. And MD regarding potential CIR and both feel that Pt. Is likely unable to tolerate intensity of CIR at this time. Pt. States that he is open to [facility]. I will withdraw case from [facility] and we can resubmit once Pt. 's tolerance improves vs pursue LTACH of SNF.   Clemens Catholic, [place], [place] Admissions Coordinator  [phone] ([place]) [phone] (office)

## 2021-08-05 NOTE — Progress Notes (Signed)
Inpatient Rehab Admissions Coordinator:   I do not have insurance auth or a bed available for this Pt. On CIR. [employer] has requested peer to peer with acute MD. I have contacted Dr. Earlie Counts and Juliann Pulse with [facility] to set up and Juliann Pulse is to contact me if Dr. Earlie Counts wishes to complete peer to peer this afternoon.    Clemens Catholic, [place], [place] Admissions Coordinator  [phone] ([place]) [phone] (office)

## 2021-08-05 NOTE — Progress Notes (Signed)
Physical Therapy Treatment Patient Details Name: Corey [name] MRN: [id number] DOB: Mar 12, 1946 Today's Date: 08/05/2021   History of Present Illness 75yo male who presented on 10/25 with L LE edema and generalized weakness. Also with recent hx of PNA. Admitted with L LE cellulitis. PMH NSCLC on chemo, Afib, CAD, chronic LBP with radiculopathy, HLD, HTN, prostate CA, CVA, ACDF, back surgery, L rotator cuff repair, TKA    PT Comments    The patient is sliding out of recliner. Readjusted prior to standing. Patient assisted to standing with mod assistance. Patient able to use UE's to push from recliner. Patient  took small steps  to bed, noted decreased Right leg control vs. Left. Patient performed seated LE exercises. SPO2 on 4 L Devol 95%.  Continue PT, will benefit from CIR for rehab.  Recommendations for follow up therapy are one component of a multi-disciplinary discharge planning process, led by the attending physician.  Recommendations may be updated based on patient status, additional functional criteria and insurance authorization.  Follow Up Recommendations  Acute inpatient rehab (3hours/day)     Assistance Recommended at Discharge Frequent or constant Supervision/Assistance  Equipment Recommendations  None recommended by PT    Recommendations for Other Services       Precautions / Restrictions Precautions Precautions: Fall Precaution Comments: CHEMO Restrictions Weight Bearing Restrictions: No     Mobility  Bed Mobility Overal bed mobility: Needs Assistance Bed Mobility: Sit to Supine     Supine to sit: Max assist;HOB elevated Sit to supine: Min assist   General bed mobility comments: patient able to place legs onto bed assist with sliding up in bed    Transfers Overall transfer level: Needs assistance Equipment used: Rolling walker (2 wheels) Transfers: Sit to/from [facility] Sit to Stand: Mod assist;+2 physical assistance;+2  safety/equipment Stand pivot transfers: Mod assist;+2 safety/equipment;+2 physical assistance         General transfer comment: assist to power up  to stand at Rw. small steops to step to bed, noted right knee tends to hyperextend and shuffles    Ambulation/Gait                 Stairs             Wheelchair Mobility    Modified Rankin (Stroke Patients Only)       Balance Overall balance assessment: Needs assistance   Sitting balance-Leahy Scale: Fair Sitting balance - Comments: tremulous towards end of bathing task with fatigue   Standing balance support: Bilateral upper extremity supported;Reliant on assistive device for balance Standing balance-Leahy Scale: Poor Standing balance comment: Requires BUE support on RW                            Cognition Arousal/Alertness: Awake/alert Behavior During Therapy: WFL for tasks assessed/performed Overall Cognitive Status: Within Functional Limits for tasks assessed                                 General Comments: AxO x 3 very pleasant , states he is  improving        Exercises General Exercises - Lower Extremity Long Arc Quad: AROM;Both;10 reps;Seated Hip Flexion/Marching: AROM;Both;10 reps;Seated    General Comments        Pertinent Vitals/Pain Pain Assessment: No/denies pain    Home Living  Prior Function            PT Goals (current goals can now be found in the care plan section) Progress towards PT goals: Progressing toward goals    Frequency    Min 3X/week      PT Plan Current plan remains appropriate;Frequency needs to be updated    Co-evaluation              AM-PAC PT "6 Clicks" Mobility   Outcome Measure  Help needed turning from your back to your side while in a flat bed without using bedrails?: A Little Help needed moving from lying on your back to sitting on the side of a flat bed without using bedrails?:  A Little Help needed moving to and from a bed to a chair (including a wheelchair)?: A Lot Help needed standing up from a chair using your arms (e.g., wheelchair or bedside chair)?: A Lot Help needed to walk in hospital room?: Total Help needed climbing 3-5 steps with a railing? : Total 6 Click Score: 12    End of Session Equipment Utilized During Treatment: Gait belt Activity Tolerance: Other (comment);Patient tolerated treatment well Patient left: in chair;with call bell/phone within reach;with chair alarm set Nurse Communication: Mobility status PT Visit Diagnosis: Unsteadiness on feet (R26.81);Muscle weakness (generalized) (M62.81);Difficulty in walking, not elsewhere classified (R26.2)     Time: 2426-8341 PT Time Calculation (min) (ACUTE ONLY): 21 min  Charges:  $Therapeutic Activity: 8-22 mins                     Tresa Endo PT Acute Rehabilitation Services Pager [phone] Office [phone]    Claretha Cooper 08/05/2021, 1:19 PM

## 2021-08-05 NOTE — Progress Notes (Signed)
PROGRESS NOTE    Corey [name]  HFW:[id number] DOB: [birth year] DOA: 07/28/2021 PCP: [name], MD    Brief Narrative:  75 year old gentleman history of non-small cell lung cancer on chemotherapy, OSA, A. fib, coronary artery disease presented with left lower extremity swelling and generalized weakness.  Patient noted to have recently been treated with Levaquin for pneumonia which she completed the day prior to his erythema.  Patient presented with shortness of breath from pneumonia.  Chest x-ray done showed unchanged left lower lobe opacity, no fevers recorded, patient on also noted with a left lower extremity cellulitis.  Patient placed on IV vancomycin cefepime and admitted for further evaluation and management.  Patient noted the night of admission 12 intractable nausea and vomiting and abdominal films and chest x-ray ordered.  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 -Was on IV vancomycin and IV Rocephin.   -IV vancomycin discontinued.   -Blood cultures with no growth to date.   -Was on IV Rocephin which has been changed to cefepime due to problem #2.   -Significant clinical improvement.   -Cellulitis has been treated with a full course of antibiotics at this  time. -Continue with supportive care  2.  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 -It is noted that patient had a sats of 77% on room air the evening on 07/31/2021, patient noted to have required up to 8 L high flow nasal cannula on 08/01/2021.   -BNP at 24.3.  -ABG with a pH of 7.4/PCO2 of 32/PO2 of 114/bicarb of 20.   -Patient placed on Lasix 40 mg IV every 12 hours with urine output of 2.5 L. -Some initial clinical improvement on 08/02/2021 however patient with worsening hypoxia 08/03/2021 with sats of 95% on 6 L nasal cannula laying supine, when patient moved to edge of bed sats of 83% on 6 L with some complaints of dizziness.  -Sputum gram stain and culture with rare Candida albicans, viridans Streptococcus.. -Sed rate > 140.  -Patient seen in consultation by PCCM who have recommended changing Flagyl to cefepime for total anaerobic coverage of 7 days, discontinuation of IV Rocephin, discontinuation of Diflucan. -PCCM in agreement with oncology who have started patient on steroids for Keytruda pneumonitis 08/04/2021. -PCCM ordered a G6PD and if normal to start patient on Bactrim for PCP prophylaxis, awaiting QuantiFERON gold as well. -Continue scheduled nebs. -Continue BiPAP nightly. -PCCM and oncology have been following -Discussed with pulmonary.  Recommendation to continue BiPAP support, disposition plans is essential at this time  3.  Nausea vomiting -Patient noted to have intractable nausea vomiting the evening of  07/29/2021, morning of 07/30/2021 . -Patient placed on IV Compazine and IV Zofran with clinical improvement.  -Patient stated had a bowel movement.   -Improving clinically with no further nausea or vomiting and tolerating clear liquids. -Abdominal films with nonobstructive  bowel gas pattern, mild gaseous distention of the stomach.   -Chest x-ray with new faint patchy opacity right midlung, differential includes mild atelectasis, aspiration or pneumonia.  Stable volume loss and patchy consolidation in the mid to lower left lung compatible with posttreatment change. -Patient currently tolerating dysphagia 3 diet, tolerating thus far -Seen by SLP and underwent MBS.     4.  aspiration pneumonia/CAP versus BOOP/drug-induced pneumonitis.  -Patient noted with intractable nausea and vomiting the evening of 07/29/2021 and in the morning of 07/30/2021.  -Repeat chest x-ray done 08/01/2021 with patchy AICD in the right lung corresponds to groundglass opacities on CT, with mild interval progression.  Chronic volume loss in the left hemothorax with pleural parenchymal opacity in the left lower lung.  -MRSA PCR negative.   -Sputum gram stain and culture with rare Candida albicans, rare variant Streptococcus. -IV vancomycin discontinued.    -Due to acute respiratory failure with hypoxia and worsening chest x-ray with concerns for aspiration pneumonia antibiotics was broadened with addition of IV Flagyl to IV Rocephin and IV azithromycin.   -Patient status post course of azithromycin. -Patient with worsening ongoing hypoxia with sats in the 83% on 6 L nasal cannula with exertion as noted on PT note on 08/03/2021.   -Repeat chest x-ray with extensive bilateral pneumonia as previously seen with increasing infiltrates on the right since prior study.  -Continue Mucinex, Brovana, Flonase, Brovana, Pulmicort. -Patient seen by PCCM who have recommended a change of Flagyl to cefepime for total antibiotic coverage of 7 days, discontinuation of IV Rocephin. -PCCM following and appreciate input and recommendations. -Recommendations for continued BiPAP support per pulmonary  5.  Hypokalemia/hypomagnesemia/hyponatremia -Improving with hydration. -Potassium at 3.9, magnesium at 1.8.    -Magnesium sulfate 4 g IV x1.   -Patient on Tikosyn and as such goal is to keep potassium around 4, magnesium at 2.  -Repeat labs in the morning.    6.  Stage IV non-small cell lung cancer -On palliative chemotherapy per oncology, Dr. Lorna Few. -Continue nebs/inhalers. -Patient's oncologist ordered staging CT scans on 07/31/2021.   -Oncology informed of admission via epic. -Patient seen by oncology today 08/04/2021. -Concerned that patient may have Keytruda induced pneumonitis and as such this will not be resumed on discharge per oncology. -Continue as per oncology  7.  Paroxysmal atrial fibrillation -Patient with intractable nausea vomiting unable to tolerate any oral intake early on during the hospitalization which has since resolved. -Patient transitioned from IV Lopressor to oral home dose Lopressor.  -Continue Tikosyn.   -Keep potassium approximately 4, keep magnesium approximately 2.   -Was on full dose Lovenox and transitioned back to home dose Xarelto, stable.     8.  Hyperlipidemia -Lipitor, fenofibrate.   9.  Coronary artery disease/hypertension -IV metoprolol has been transition back to home dose oral metoprolol.  Continue statin, anticoagulation.   10.  Parkinson's -Sinemet  11.  Prolonged QT -Repeat EKG with resolution of QT prolongation currently at 467 from 530 on admission. -Magnesium currently at 1.8, potassium at 3.9.   -Magnesium sulfate 4 g IV x1. -Keep potassium approximately at 4, magnesium approximately 2. -Follow.  12.  OSA -Nausea and vomiting improved no further nausea and vomiting noted.   -Was on CPAP nightly  however due to respiratory issues has been placed on BiPAP.  13.  History of CVA -No overt bleeding.  -Xarelto for secondary stroke prophylaxis.   -Statin.   14.  Generalized weakness -PT/OT. -PT recommending SNF however patient and family would prefer to go home with home health therapies. -Initial PT recommendations for CIR.   However, patient reports he is unable to tolerate 3 hours of intensive therapy per day.  In fact, patient states he struggles to stand or even sit at the edge of the bed for more than a minute at a time -Discussed with transitions of care staff, plan for possible LTAC placement instead  15.  Anemia -Likely secondary to chemotherapy. -Patient with no overt bleeding. -Hemoglobin at 7.0 on 08/02/2021. -Status post transfusion 2 units packed red blood cells 08/02/2021  -Patient remains hemodynamically stable   DVT prophylaxis: Xarelto Code Status: DNR Family Communication: Pt in room, family not at bedside  Status is: Inpatient  Remains inpatient appropriate because: severity of ill ness    Consultants:  PCCM Oncology  Procedures:  Abdominal x-ray 07/30/2021 Chest x-ray 07/30/2021, 08/01/2021 Bilateral lower extremity Dopplers 07/30/2021 CT chest/CT abdomen and pelvis 07/31/2021 Transfused 2 units packed red blood cells 08/02/2021  Antimicrobials: Anti-infectives (From admission, onward)    Start     Dose/Rate Route Frequency Ordered Stop   08/04/21 2000  metroNIDAZOLE (FLAGYL) IVPB 500 mg  Status:  Discontinued        500 mg 100 mL/hr over 60 Minutes Intravenous Every 12 hours 08/04/21 1413 08/04/21 1621   08/04/21 1500  ceFEPIme (MAXIPIME) 2 g in sodium chloride 0.9 % 100 mL IVPB        2 g 200 mL/hr over 30 Minutes Intravenous Every 8 hours 08/04/21 1413     08/03/21 1000  fluconazole (DIFLUCAN) tablet 200 mg  Status:  Discontinued        200 mg Oral Daily 08/03/21 0739 08/04/21 1347   08/02/21 2000  metroNIDAZOLE (FLAGYL) IVPB 500 mg  Status:  Discontinued        500 mg 100 mL/hr over 60 Minutes Intravenous Every 12 hours 08/02/21 1557 08/04/21 1413   08/02/21 1300  metroNIDAZOLE (FLAGYL) IVPB 500 mg  Status:  Discontinued        500 mg 100 mL/hr over 60 Minutes Intravenous Every 12 hours 08/02/21 1150 08/02/21 1557   08/02/21 1000  fluconazole (DIFLUCAN) IVPB 200 mg   Status:  Discontinued        200 mg 100 mL/hr over 60 Minutes Intravenous Every 24 hours 08/02/21 0827 08/03/21 0739   08/01/21 1000  azithromycin (ZITHROMAX) tablet 500 mg  Status:  Discontinued        500 mg Oral Daily 07/31/21 1438 08/03/21 1111   07/30/21 1000  azithromycin (ZITHROMAX) 500 mg in sodium chloride 0.9 % 250 mL IVPB  Status:  Discontinued        500 mg 250 mL/hr over 60 Minutes Intravenous Every 24 hours 07/30/21 0755 07/31/21 1438   07/30/21 0000  vancomycin (VANCOCIN) IVPB 1000 mg/200 mL premix  Status:  Discontinued        1,000 mg 200 mL/hr over 60 Minutes Intravenous Every 12 hours 07/29/21 0854 07/31/21 1436   07/29/21 1030  vancomycin (VANCOREADY) IVPB 2000 mg/400 mL        2,000 mg 200 mL/hr over 120 Minutes Intravenous  Once 07/29/21 0844 07/29/21 1447   07/29/21 1000  cefTRIAXone (ROCEPHIN) 2 g in sodium chloride 0.9 % 100 mL  IVPB  Status:  Discontinued        2 g 200 mL/hr over 30 Minutes Intravenous Every 24 hours 07/29/21 0832 08/04/21 1413   07/28/21 1600  vancomycin (VANCOCIN) IVPB 1000 mg/200 mL premix        1,000 mg 200 mL/hr over 60 Minutes Intravenous  Once 07/28/21 1545 07/28/21 1800   07/28/21 1600  ceFEPIme (MAXIPIME) 2 g in sodium chloride 0.9 % 100 mL IVPB        2 g 200 mL/hr over 30 Minutes Intravenous  Once 07/28/21 1545 07/28/21 1701   07/28/21 1400  ceFAZolin (ANCEF) IVPB 2g/100 mL premix  Status:  Discontinued        2 g 200 mL/hr over 30 Minutes Intravenous Every 8 hours 07/28/21 1334 07/28/21 1545       Subjective: Reports feeling weak this AM  Objective: Vitals:   08/05/21 0555 08/05/21 0732 08/05/21 1259 08/05/21 1500  BP: 121/78  116/79   Pulse: 75  88   Resp: 20  16   Temp: 98.4 F (36.9 C)  97.6 F (36.4 C)   TempSrc:   Oral   SpO2: 94% 92% 97% 97%  Weight:      Height:        Intake/Output Summary (Last 24 hours) at 08/05/2021 1701 Last data filed at 08/05/2021 1512 Gross per 24 hour  Intake 880 ml  Output 2875  ml  Net -1995 ml   Filed Weights   08/03/21 0617 08/04/21 0443 08/05/21 0507  Weight: 102.8 kg 100.5 kg 98.7 kg    Examination: General exam: Awake, laying in bed, in nad Respiratory system: Normal respiratory effort, no wheezing Cardiovascular system: regular rate, s1, s2 Gastrointestinal system: Soft, nondistended, positive BS Central nervous system: CN2-12 grossly intact, strength intact Extremities: Perfused, no clubbing Skin: Normal skin turgor, healing LE celluliits Psychiatry: Mood normal // no visual hallucinations   Data Reviewed: I have personally reviewed following labs and imaging studies  CBC: Recent Labs  Lab 08/01/21 0457 08/02/21 0321 08/03/21 0315 08/04/21 0917 08/05/21 0643  WBC 6.4 6.3 6.2 6.3 4.5  NEUTROABS 5.4 5.3 5.3 5.1 4.1  HGB 7.3* 7.0* 8.9* 9.4* 9.7*  HCT 22.2* 21.6* 26.7* 28.3* 29.9*  MCV 96.5 96.0 94.0 96.6 98.4  PLT 140* 146* 148* 203 496   Basic Metabolic Panel: Recent Labs  Lab 07/31/21 0400 08/01/21 0457 08/02/21 0321 08/03/21 0315 08/04/21 0917 08/05/21 0643  NA 133* 130* 129* 129* 129* 134*  K 4.1 3.9 3.4* 3.6 3.9 4.3  CL 103 104 99 97* 98 102  CO2 21* 21* 23 25 25 24   GLUCOSE 90 91 94 106* 107* 150*  BUN 13 9 10 12 12 19   CREATININE 0.58* 0.64 0.61 0.63 0.67 0.70  CALCIUM 8.1* 8.0* 8.1* 8.3* 8.1* 8.8*  MG 1.8 1.7 1.8 1.7 1.8 2.3  PHOS 2.3*  --   --   --   --  4.1   GFR: Estimated Creatinine Clearance: 100.9 mL/min (by C-G formula based on SCr of 0.7 mg/dL). Liver Function Tests: Recent Labs  Lab 07/30/21 0500 07/31/21 0400 08/05/21 0643  AST 38  --  31  ALT 14  --  13  ALKPHOS 54  --  61  BILITOT 0.8  --  0.7  PROT 6.7  --  6.8  ALBUMIN 2.3* 1.8* 2.1*   No results for input(s): LIPASE, AMYLASE in the last 168 hours. No results for input(s): AMMONIA in the last 168 hours. Coagulation Profile:  No results for input(s): INR, PROTIME in the last 168 hours. Cardiac Enzymes: No results for input(s): CKTOTAL, CKMB,  CKMBINDEX, TROPONINI in the last 168 hours. BNP (last 3 results) No results for input(s): PROBNP in the last 8760 hours. HbA1C: No results for input(s): HGBA1C in the last 72 hours. CBG: No results for input(s): GLUCAP in the last 168 hours. Lipid Profile: No results for input(s): CHOL, HDL, LDLCALC, TRIG, CHOLHDL, LDLDIRECT in the last 72 hours. Thyroid Function Tests: No results for input(s): TSH, T4TOTAL, FREET4, T3FREE, THYROIDAB in the last 72 hours. Anemia Panel: No results for input(s): VITAMINB12, FOLATE, FERRITIN, TIBC, IRON, RETICCTPCT in the last 72 hours. Sepsis Labs: Recent Labs  Lab 08/04/21 0917 08/05/21 0643  PROCALCITON 0.31 0.19    Recent Results (from the past 240 hour(s))  Blood culture (routine single)     Status: None   Collection Time: 07/28/21 11:44 AM   Specimen: BLOOD LEFT ARM  Result Value Ref Range Status   Specimen Description   Final    BLOOD LEFT ARM Performed at [facility], [address], [place], [place] [address]    Special Requests   Final    Blood Culture adequate volume Performed at [facility], [address] [place], [place] [address]    Culture   Final    NO GROWTH 5 DAYS Performed at [facility], [place] [address], [place], [place] [address]    Report Status 08/02/2021 FINAL  Final  Culture, blood (routine x 2)     Status: None   Collection Time: 07/28/21  2:03 PM   Specimen: BLOOD  Result Value Ref Range Status   Specimen Description   Final    BLOOD CENTRAL LINE BLOOD RIGHT ARM Performed at [facility], [address] [place], [place] [address]    Special Requests   Final    BOTTLES DRAWN AEROBIC AND ANAEROBIC Blood Culture adequate volume Performed at [facility], [address] [place], [place] [address]    Culture   Final    NO GROWTH 5 DAYS Performed at [facility], [place] [address], [place], [place] [address]    Report  Status 08/02/2021 FINAL  Final  Resp Panel by RT-PCR (Flu A&B, Covid) Nasopharyngeal Swab     Status: None   Collection Time: 07/28/21  3:35 PM   Specimen: Nasopharyngeal Swab; Nasopharyngeal(NP) swabs in vial transport medium  Result Value Ref Range Status   SARS Coronavirus 2 by RT PCR NEGATIVE NEGATIVE Final    Comment: (NOTE) SARS-CoV-2 target nucleic acids are NOT DETECTED.  The SARS-CoV-2 RNA is generally detectable in upper respiratory specimens during the acute phase of infection. The lowest concentration of SARS-CoV-2 viral copies this assay can detect is 138 copies/mL. A negative result does not preclude SARS-Cov-2 infection and should not be used as the sole basis for treatment or other patient management decisions. A negative result may occur with  improper specimen collection/handling, submission of specimen other than nasopharyngeal swab, presence of viral mutation(s) within the areas targeted by this assay, and inadequate number of viral copies(<138 copies/mL). A negative result must be combined with clinical observations, patient history, and epidemiological information. The expected result is Negative.  Fact Sheet for Patients:  EntrepreneurPulse.com.au  Fact Sheet for Healthcare Providers:  IncredibleEmployment.be  This test is no t yet approved or cleared by the [place] FDA and  has been authorized for detection and/or diagnosis of SARS-CoV-2 by FDA under  an Emergency Use Authorization (EUA). This EUA will remain  in effect (meaning this test can be used) for the duration of the COVID-19 declaration under Section 564(b)(1) of the Act, 21 U.S.C.section 360bbb-3(b)(1), unless the authorization is terminated  or revoked sooner.       Influenza A by PCR NEGATIVE NEGATIVE Final   Influenza B by PCR NEGATIVE NEGATIVE Final    Comment: (NOTE) The Xpert Xpress SARS-CoV-2/FLU/RSV plus assay is intended as an aid in the  diagnosis of influenza from Nasopharyngeal swab specimens and should not be used as a sole basis for treatment. Nasal washings and aspirates are unacceptable for Xpert Xpress SARS-CoV-2/FLU/RSV testing.  Fact Sheet for Patients: EntrepreneurPulse.com.au  Fact Sheet for Healthcare Providers: IncredibleEmployment.be  This test is not yet approved or cleared by the [place] FDA and has been authorized for detection and/or diagnosis of SARS-CoV-2 by FDA under an Emergency Use Authorization (EUA). This EUA will remain in effect (meaning this test can be used) for the duration of the COVID-19 declaration under Section 564(b)(1) of the Act, 21 U.S.C. section 360bbb-3(b)(1), unless the authorization is terminated or revoked.  Performed at [facility], [address], [place], [place] [address]   Expectorated Sputum Assessment w Gram Stain, Rflx to Resp Cult     Status: None   Collection Time: 07/30/21  7:57 AM   Specimen: Sputum  Result Value Ref Range Status   Specimen Description SPUTUM  Final   Special Requests NONE  Final   Sputum evaluation   Final    THIS SPECIMEN IS ACCEPTABLE FOR SPUTUM CULTURE Performed at [facility], [place] [address] [place], [place] [address]    Report Status 07/30/2021 FINAL  Final  Culture, Respiratory w Gram Stain     Status: None   Collection Time: 07/30/21  7:57 AM   Specimen: SPU  Result Value Ref Range Status   Specimen Description   Final    SPUTUM Performed at [facility] [address] [place], [place] [address]    Special Requests   Final    NONE Reflexed from [id number] Performed at [facility] [address], [place], [place] [address]    Gram Stain   Final    RARE SQUAMOUS EPITHELIAL CELLS PRESENT RARE WBC PRESENT, PREDOMINANTLY PMN RARE GRAM NEGATIVE RODS    Culture   Final    RARE CANDIDA ALBICANS RARE VIRIDANS  STREPTOCOCCUS Standardized susceptibility testing for this organism is not available. Performed at [facility], [place] [address], [place], [place] [address]    Report Status 08/01/2021 FINAL  Final  MRSA Next Gen by PCR, Nasal     Status: None   Collection Time: 07/31/21  8:12 AM   Specimen: Nasal Mucosa; Nasal Swab  Result Value Ref Range Status   MRSA by PCR Next Gen NOT DETECTED NOT DETECTED Final    Comment: (NOTE) The GeneXpert MRSA Assay (FDA approved for NASAL specimens only), is one component of a comprehensive MRSA colonization surveillance program. It is not intended to diagnose MRSA infection nor to guide or monitor treatment for MRSA infections. Test performance is not FDA approved in patients less than 72 years old. Performed at [facility], [place] [address], [place],  [address]      Radiology Studies: ECHOCARDIOGRAM COMPLETE  Result Date: 08/04/2021    ECHOCARDIOGRAM REPORT   Patient Name:   RON BESKE Date of Exam: 08/04/2021 Medical Rec #:  [id number]   Height:       73.5 in Accession #:    [id number]  Weight:       221.6 lb Date of Birth:  [birth year]  BSA:          2.259 m Patient Age:    70 years    BP:           125/75 mmHg Patient Gender: M           HR:           101 bpm. Exam Location:  Inpatient Procedure: 2D Echo, Cardiac Doppler and Color Doppler Indications:    [place] respiratory distress R06.03  History:        Patient has prior history of Echocardiogram examinations, most                 recent 02/25/2021. Stroke, Arrythmias:Atrial Fibrillation; Risk                 Factors:Dyslipidemia and Hypertension.  Sonographer:    Bernadene Person RDCS Referring Phys: [place]  1. Left ventricular ejection fraction, by estimation, is 65 to 70%. The left ventricle has normal function. The left ventricle has no regional wall motion abnormalities. There is mild left ventricular hypertrophy. Left ventricular diastolic  parameters are indeterminate.  2. Right ventricular systolic function mild to moderately reduced. The right ventricular size is mildly enlarged. There is normal pulmonary artery systolic pressure.  3. Trivial mitral valve regurgitation.  4. The aortic valve is tricuspid. Aortic valve regurgitation is not visualized. Mild aortic valve sclerosis is present, with no evidence of aortic valve stenosis.  5. Aortic dilatation noted. There is mild dilatation of the ascending aorta, measuring 40 mm.  6. The inferior vena cava is normal in size with greater than 50% respiratory variability, suggesting right atrial pressure of 3 mmHg. FINDINGS  Left Ventricle: Left ventricular ejection fraction, by estimation, is 65 to 70%. The left ventricle has normal function. The left ventricle has no regional wall motion abnormalities. The left ventricular internal cavity size was normal in size. There is  mild left ventricular hypertrophy. Left ventricular diastolic parameters are indeterminate. Right Ventricle: The right ventricular size is mildly enlarged. Right vetricular wall thickness was not assessed. Right ventricular systolic function mild to moderately reduced. There is normal pulmonary artery systolic pressure. The tricuspid regurgitant velocity is 2.24 m/s, and with an assumed right atrial pressure of 3 mmHg, the estimated right ventricular systolic pressure is 64.3 mmHg. Left Atrium: Left atrial size was normal in size. Right Atrium: Right atrial size was normal in size. Pericardium: Trivial pericardial effusion is present. Presence of pericardial fat pad. Mitral Valve: There is mild thickening of the mitral valve leaflet(s). Mild mitral annular calcification. Trivial mitral valve regurgitation. Tricuspid Valve: The tricuspid valve is normal in structure. Tricuspid valve regurgitation is trivial. Aortic Valve: The aortic valve is tricuspid. Aortic valve regurgitation is not visualized. Mild aortic valve sclerosis is present,  with no evidence of aortic valve stenosis. Pulmonic Valve: The pulmonic valve was normal in structure. Pulmonic valve regurgitation is mild to moderate. Aorta: Aortic dilatation noted. There is mild dilatation of the ascending aorta, measuring 40 mm. Venous: The inferior vena cava is normal in size with greater than 50% respiratory variability, suggesting right atrial pressure of 3 mmHg. IAS/Shunts: No atrial level shunt detected by color flow Doppler.  LEFT VENTRICLE PLAX 2D LVIDd:         4.20 cm  Diastology LVIDs:         2.60 cm   LV e' medial:    5.33 cm/s LV PW:         1.00 cm   LV E/e' medial:  10.7 LV IVS:        1.20 cm   LV e' lateral:   5.68 cm/s LVOT diam:     2.40 cm   LV E/e' lateral: 10.1 LV SV:         55 LV SV Index:   24 LVOT Area:     4.52 cm  RIGHT VENTRICLE RV S prime:     19.10 cm/s TAPSE (M-mode): 1.9 cm LEFT ATRIUM           Index        RIGHT ATRIUM           Index LA diam:      2.70 cm 1.20 cm/m   RA Area:     17.30 cm LA Vol (A2C): 34.1 ml 15.10 ml/m  RA Volume:   46.70 ml  20.68 ml/m LA Vol (A4C): 34.2 ml 15.14 ml/m  AORTIC VALVE LVOT Vmax:   81.80 cm/s LVOT Vmean:  54.400 cm/s LVOT VTI:    0.121 m  AORTA Ao Root diam: 3.40 cm Ao Asc diam:  4.00 cm MITRAL VALVE               TRICUSPID VALVE MV Area (PHT): 4.21 cm    TR Peak grad:   20.1 mmHg MV Decel Time: 180 msec    TR Vmax:        224.00 cm/s MV E velocity: 57.20 cm/s MV A velocity: 86.80 cm/s  SHUNTS MV E/A ratio:  0.66        Systemic VTI:  0.12 m                            Systemic Diam: 2.40 cm Dorris Carnes MD Electronically signed by Dorris Carnes MD Signature Date/Time: 08/04/2021/5:53:27 PM    Final     Scheduled Meds:  acidophilus   Oral Daily   arformoterol  15 mcg Nebulization BID   atorvastatin  40 mg Oral Daily   budesonide (PULMICORT) nebulizer solution  0.5 mg Nebulization BID   carbidopa-levodopa  1 tablet Oral 3 times per day   chlorhexidine  15 mL Mouth Rinse BID   Chlorhexidine Gluconate Cloth  6 each  Topical Daily   dofetilide  500 mcg Oral BID   fenofibrate  160 mg Oral Daily   fluticasone  2 spray Each Nare Daily   folic acid  1 mg Oral Daily   guaiFENesin  1,200 mg Oral BID   ipratropium  1-2 spray Each Nare Q12H   ipratropium  0.5 mg Nebulization TID   levalbuterol  0.63 mg Nebulization TID   magnesium oxide  400 mg Oral Daily   mouth rinse  15 mL Mouth Rinse q12n4p   metoprolol succinate  25 mg Oral Daily   multivitamin with minerals  1 tablet Oral Daily   pantoprazole  40 mg Oral Daily   potassium chloride  10 mEq Oral Daily   predniSONE  100 mg Oral Q breakfast   rivaroxaban  20 mg Oral Q supper   tamsulosin  0.4 mg Oral QPC supper   topiramate  100 mg Oral BID   Continuous Infusions:  ceFEPime (MAXIPIME) IV 2 g (08/05/21 1209)  LOS: 7 days   Marylu Lund, MD [facility] Pager On Amion  If 7PM-7AM, please contact night-coverage 08/05/2021, 5:01 PM

## 2021-08-05 NOTE — Progress Notes (Signed)
Occupational Therapy Treatment Patient Details Name: Corey [name] MRN: [id number] DOB: 1946-03-23 Today's Date: 08/05/2021   History of present illness 75yo male who presented on 10/25 with L LE edema and generalized weakness. Also with recent hx of PNA. Admitted with L LE cellulitis. PMH NSCLC on chemo, Afib, CAD, chronic LBP with radiculopathy, HLD, HTN, prostate CA, CVA, ACDF, back surgery, L rotator cuff repair, TKA   OT comments  Patient reported feeling stronger today with noted increase in appetite. Patient was able to sit on edge of bed and engage in UB bathing tasks with good sitting balance. Patient transferred with mod A x2 for sit to stand and min A +2 to complete transfer with RW. Patient's discharge plan remains appropriate at this time. OT will continue to follow acutely.     Recommendations for follow up therapy are one component of a multi-disciplinary discharge planning process, led by the attending physician.  Recommendations may be updated based on patient status, additional functional criteria and insurance authorization.    Follow Up Recommendations  Acute inpatient rehab (3hours/day)    Assistance Recommended at Discharge Frequent or constant Supervision/Assistance  Equipment Recommendations  [facility]    Recommendations for Other Services      Precautions / Restrictions Precautions Precautions: Fall Precaution Comments: CHEMO Restrictions Weight Bearing Restrictions: No       Mobility Bed Mobility Overal bed mobility: Needs Assistance Bed Mobility: Supine to Sit     Supine to sit: Max assist;HOB elevated     General bed mobility comments: with increased time and education on proper hand placement    Transfers Overall transfer level: Needs assistance Equipment used: Rolling walker (2 wheels) Transfers: Sit to/from [employer] Transfers Sit to Stand: Mod assist;+2 physical assistance;+2 safety/equipment Stand pivot transfers: Min assist;+2  safety/equipment               Balance Overall balance assessment: Needs assistance   Sitting balance-Leahy Scale: Fair Sitting balance - Comments: tremulous towards end of bathing task with fatigue   Standing balance support: Bilateral upper extremity supported Standing balance-Leahy Scale: Poor Standing balance comment: Requires BUE support on RW                           ADL either performed or assessed with clinical judgement   ADL Overall ADL's : Needs assistance/impaired   Eating/Feeding Details (indicate cue type and reason): patient needed min A for set up to be able to set up cereal for breakfast Grooming: Wash/dry face;Wash/dry hands;Sitting Grooming Details (indicate cue type and reason): sitting on edge of bed. Upper Body Bathing: Min guard;Sitting Upper Body Bathing Details (indicate cue type and reason): sitting on edge of bed with increased time     Upper Body Dressing : Set up;Sitting Upper Body Dressing Details (indicate cue type and reason): EOB                   General ADL Comments: patient is min A +2 for transfers on this date with increased time to complete transfer and education on proper hand and foot placement,.     Vision Patient Visual Report: No change from baseline     Perception     Praxis      Cognition Arousal/Alertness: Awake/alert Behavior During Therapy: WFL for tasks assessed/performed Overall Cognitive Status: Within Functional Limits for tasks assessed  General Comments: AxO x 3 very pleasant but feels real weak.          Exercises     Shoulder Instructions       General Comments      Pertinent Vitals/ Pain       Pain Assessment: No/denies pain  Home Living                                          Prior Functioning/Environment              Frequency  Min 2X/week        Progress Toward Goals  OT Goals(current goals  can now be found in the care plan section)  Progress towards OT goals: Progressing toward goals     Plan Discharge plan remains appropriate    Co-evaluation                 AM-PAC OT "6 Clicks" Daily Activity     Outcome Measure   Help from another person eating meals?: None Help from another person taking care of personal grooming?: A Little Help from another person toileting, which includes using toliet, bedpan, or urinal?: A Lot Help from another person bathing (including washing, rinsing, drying)?: A Lot Help from another person to put on and taking off regular upper body clothing?: A Little Help from another person to put on and taking off regular lower body clothing?: A Lot 6 Click Score: 16    End of Session Equipment Utilized During Treatment: Gait belt;Rolling walker (2 wheels)  OT Visit Diagnosis: Unsteadiness on feet (R26.81)   Activity Tolerance Patient tolerated treatment well   Patient Left in chair;with call bell/phone within reach;with chair alarm set;with nursing/sitter in room   Nurse Communication Other (comment) (nurse cleared patient to participate)        Time: 8937-3428 OT Time Calculation (min): 36 min  Charges: OT General Charges $OT Visit: 1 Visit OT Treatments $Self Care/Home Management : 23-37 mins  Jackelyn Poling OTR/L, MS Acute Rehabilitation Department Office# [phone] Pager# [place] 08/05/2021, 10:29 AM

## 2021-08-06 DIAGNOSIS — J9601 Acute respiratory failure with hypoxia: Secondary | ICD-10-CM | POA: Diagnosis not present

## 2021-08-06 DIAGNOSIS — L03116 Cellulitis of left lower limb: Secondary | ICD-10-CM | POA: Diagnosis not present

## 2021-08-06 DIAGNOSIS — I10 Essential (primary) hypertension: Secondary | ICD-10-CM | POA: Diagnosis not present

## 2021-08-06 LAB — CBC
HCT: 29.8 % — ABNORMAL LOW (ref 39.0–52.0)
Hemoglobin: 9.6 g/dL — ABNORMAL LOW (ref 13.0–17.0)
MCH: 31.7 pg (ref 26.0–34.0)
MCHC: 32.2 g/dL (ref 30.0–36.0)
MCV: 98.3 fL (ref 80.0–100.0)
Platelets: 247 10*3/uL (ref 150–400)
RBC: 3.03 MIL/uL — ABNORMAL LOW (ref 4.22–5.81)
RDW: 18.6 % — ABNORMAL HIGH (ref 11.5–15.5)
WBC: 8.1 10*3/uL (ref 4.0–10.5)
nRBC: 0 % (ref 0.0–0.2)

## 2021-08-06 LAB — COMPREHENSIVE METABOLIC PANEL
ALT: 20 U/L (ref 0–44)
AST: 46 U/L — ABNORMAL HIGH (ref 15–41)
Albumin: 2.1 g/dL — ABNORMAL LOW (ref 3.5–5.0)
Alkaline Phosphatase: 65 U/L (ref 38–126)
Anion gap: 6 (ref 5–15)
BUN: 23 mg/dL (ref 8–23)
CO2: 24 mmol/L (ref 22–32)
Calcium: 9 mg/dL (ref 8.9–10.3)
Chloride: 103 mmol/L (ref 98–111)
Creatinine, Ser: 0.68 mg/dL (ref 0.61–1.24)
GFR, Estimated: >60 mL/min (ref >60)
Glucose, Bld: 132 mg/dL — ABNORMAL HIGH (ref 70–99)
Potassium: 4.5 mmol/L (ref 3.5–5.1)
Sodium: 133 mmol/L — ABNORMAL LOW (ref 135–145)
Total Bilirubin: 0.5 mg/dL (ref 0.3–1.2)
Total Protein: 6.6 g/dL (ref 6.5–8.1)

## 2021-08-06 MED ORDER — METRONIDAZOLE 500 MG/100ML IV SOLN
500.0000 mg | Freq: Two times a day (BID) | INTRAVENOUS | Status: AC
  Administered 2021-08-06 – 2021-08-10 (×10): 500 mg via INTRAVENOUS
  Filled 2021-08-06 (×10): qty 100

## 2021-08-06 MED ORDER — SODIUM CHLORIDE 0.9 % IV SOLN
2.0000 g | INTRAVENOUS | Status: AC
  Administered 2021-08-06 – 2021-08-10 (×5): 2 g via INTRAVENOUS
  Filled 2021-08-06 (×5): qty 20

## 2021-08-06 NOTE — Consult Note (Signed)
NAME:  Corey [name], MRN:  [id number], DOB:  04/08/1946, LOS: 39 ADMISSION DATE:  07/28/2021, CONSULTATION DATE:  08/03/2021 REFERRING MD:  Dr Irine Seal, CHIEF COMPLAINT:  acute hypoxemic respiratory failure   BRIEF   75 year old male with non-small cell lung cancer stage IV, [details below], OSA, A. fib, coronary artery disease.  Some 10-14 days prior to admission was treated for pneumonia as an outpatient with Levaquin.  Unclear at this point many days of antibiotics he took.  Looks like he finished the course and then 1 week prior to admission the day after he stopped the antibiotics he developed lower extremity cellulitis associated with fatigue and worsening fever and cough.  Admission x-ray showed left lower lobe opacity [no fevers] white count normal.  Left lower extremity cellulitis documented.  Started on antibiotics.  Admitted 07/29/2021  Course in the hospital - It appears that his left lower extremity cellulitis started improving.  But he developed intractable nausea and vomiting in the evening of admission 07/29/2021 and the following day on 07/30/2021.  And then he developed new onset hypoxemia evening of 07/31/2021 requiring up to 8 L high flow nasal cannula by 08/01/2021.  Treated with IV Lasix with some improvement to 4 L nasal cannula.  Antibiotics are being continued.  There is concern for aspiration.  Speech-language pathologist evaluation 08/01/2021 noted that patient did not cough during MBS but he did have some retrograde propulsion of the liquid to thoracic esophagus questioning dysmotility and aspiration.  On 08/03/2021: Persistent hypoxemia 5 L nasal cannula.  Afebrile but low grade fever 2 days prior.   He had his brother states this hypoxemia is categorically new.  Review of his CT scan visualization of the images shows that even August 2022 he had left lower lobe consolidation with air bronchogram.  Currently on admission 07/23/2021 this process appears to be  worse.  Brother in here worried about extreme physical deconditioning. Pertinent  Medical History  Oncology history-last seen Dr. 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 He has no actionable mutation and PD-L1 expression of 30%. He is status post palliative radiotherapy to the obstructive lung mass in the left lung under the care of Dr. Tammi Klippel.  The patient is currently undergoing systemic chemotherapy with carboplatin initially for AUC of 5, Alimta 500 Mg/M2 and Keytruda 200 Mg IV every 3 weeks status post 5 cycles.  Starting from cycle #2 the patient is on treatment with carboplatin for AUC of 4 and Alimta 400 Mg/M2.  Starting from cycle #5 the patient is on maintenance treatment with Alimta and Keytruda every 3 weeks    DIAGNOSIS: Stage IV (T1b, N2, M1c) non-small cell lung cancer favoring adenocarcinoma presented with left lung mass in addition to left hilar adenopathy and malignant left pleural effusion, intrathoracic and upper abdominal lymph nodes, left pleural/subpleural nodularity and hepatic lesions diagnosed in May 2022.     Molecular Studies by [employer]: [id number] 0.5%   Olaparib Yes [id number] 50.9%   Olaparib Yes [id number] 0.8% None      PDL1: 30%   PRIOR THERAPY: 1) Palliative radiotherapy to the obstructive lung mass in the left lung under the care of Dr. MTammi Klippel Last treatment  03/23/21   CURRENT THERAPY: Palliative systemic chemotherapy with carboplatin AUC 5, Alimta 500 mg per metered squared, Keytruda 200 mg IV every 3 weeks.  First dose expected on 03/31/21. Status post 5 cycles.  Starting from cycle #5 the  patient is on maintenance treatment with Alimta 500 Mg/M2 and Keytruda 200 Mg IV every 3 weeks.    has a past medical history of Anemia, Anxiety, Arthritis, Carotid artery disease ([place]) (08/30/2016), Chronic lower back pain, Coronary atherosclerosis of native coronary artery, Diverticulitis, Dyslipidemia, Dyspnea, Essential hypertension, benign, Family history of breast cancer, Family history of lung cancer, Family history of prostate cancer, GERD (gastroesophageal reflux disease), Lumbar radiculopathy, chronic (02/04/2015), Obesity, OSA on CPAP, Paroxysmal atrial fibrillation ([place]), Pneumonia (01/1996), PONV (postoperative nausea and vomiting), Prostate CA ([place]), Recurrent upper respiratory infection (URI), Stroke ([place]) (07/2012), and Visit for monitoring Tikosyn therapy (09/18/2019).   has a past surgical history that includes Anterior cervical decomp/discectomy fusion (07/2001; 10/2002); Posterior lumbar fusion (10/2003); Knee arthroscopy (Left, 10/2005); Total knee arthroplasty (Left, 10/2006); TEE without cardioversion (07/07/2012); Shoulder arthroscopy (Right, 08/2011); Tonsillectomy and adenoidectomy (~ 1956); Lumbar laminectomy/decompression microdiscectomy (03/2005); Knee arthroscopy w/ partial medial meniscectomy (Left, 09/2005); Shoulder open rotator cuff repair (Left, 07/2014); Colonoscopy w/ polypectomy (02/2014); Back surgery; Carpal tunnel release (Left, 10/2015); Trigger finger release (Left, 10/2015); Joint replacement; Coronary angioplasty with stent (05/2003; 12/2005); HAND SURGERY (02/2019); implantable loop recorder placement (02/27/2020); Adenoidectomy; Thoracentesis (N/A, 02/25/2021); Video bronchoscopy with endobronchial ultrasound (Left, 02/26/2021); Fine needle aspiration (02/26/2021); Chest tube insertion (Left, 03/17/2021); and IR IMAGING GUIDED PORT INSERTION (04/16/2021).   Significant Hospital Events: Including procedures, antibiotic start and stop dates in addition to other pertinent events    07/28/2021 - admit with LLE Ceullitis 10/26 and 10/27 - intractable nausea and vomiting  10/27 sputum - rare strep viridans and candida (reported 10/29) ECHO 11/1 - no valvular issues 10/28 pm - new onset hypoxemia evening of 07/31/2021  10/29 -  8 L high flow nasal cannula   IV Lasix with some improvement to 4 L nasal cannula.  Antibiotics are being continued.  There is concern for aspiration.   Speech-language pathologist evaluation did not cough during MBS but he did have some retrograde propulsion of the liquid to thoracic esophagus questioning dysmotility and aspiration.   08/03/2021: Persistent hypoxemia 5 L nasal cannula. - ccm consult 11/1 - 11/1 - used BiPAP as advised. 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 On 4L [place]. Also completed ceftriaxone 7d. On flaguyl. QUant gold pending. He is still worried about extreme physical deconditioning since admit. REports signfiicant dyspnea/fatigue doing simple stuff ANTIBIOTICS CONSOLIDATED   Interim History / Subjective:   11/2 -down to 3L . Off biPAP. Just doing home cPAP. Very deconditioned. Unable to even lift himself 6 inches off bed. Wife RAntoinette Haskett(works [employer] consultant[employer] A) at bedside. Does not qualfiy for CIR (too weak). [facility]  - curb side discussion with ID: strep and candidat -- likely reflect aspiration of oral flora and cw vmiting as opposed to cellultis and current course is reassuring for lack of endocarditis. REcommending total 7d anaerobic coverage with unasyn  Objective   Blood pressure 115/75, pulse 87, temperature 97.8 F (36.6 C), temperature source Oral, resp. rate 19, height 6' 1.5" (1.867 m), weight 105.8 kg, SpO2 95 %.        Intake/Output Summary (Last 24 hours) at 08/06/2021 1131 Last data filed at 08/06/2021 1000  Gross per 24 hour  Intake 1180.22 ml  Output 2300 ml  Net -1119.78 ml   Filed Weights   08/04/21 0443  08/05/21 0507 08/06/21 0611  Weight: 100.5 kg 98.7 kg 105.8 kg    General Appearance:  Looks decondtioned. On bed. But no distress Head:  Normocephalic, without obvious abnormality, atraumatic Eyes:  PERRL - yes, conjunctiva/corneas - muddy     Ears:  Normal external ear canals, both ears Nose:  G tube - no but on 3L [place] Throat:  ETT TUBE - n , OG tube - no Neck:  Supple,  No enlargement/tenderness/nodules Lungs:  No distress but crackles at base Heart:  S1 and S2 normal, no murmur, CVP - no.  Pressors - no Abdomen:  Soft, no masses, no organomegaly Genitalia / Rectal:  Not done Extremities:  Extremities- intact Skin:  ntact in exposed areas . Sacral area - none Neurologic:  Sedation - none -> RASS - +1 . Moves all 4s - es. CAM-ICU - neg . Orientation - x3+         LABS   Anti-infectives (From admission, onward)    Start     Dose/Rate Route Frequency Ordered Stop   08/06/21 1215  cefTRIAXone (ROCEPHIN) 2 g in sodium chloride 0.9 % 100 mL IVPB        2 g 200 mL/hr over 30 Minutes Intravenous Every 24 hours 08/06/21 1120 08/11/21 1214   08/06/21 1215  metroNIDAZOLE (FLAGYL) IVPB 500 mg        500 mg 100 mL/hr over 60 Minutes Intravenous 2 times daily 08/06/21 1120 08/13/21 0959   08/04/21 2000  metroNIDAZOLE (FLAGYL) IVPB 500 mg  Status:  Discontinued        500 mg 100 mL/hr over 60 Minutes Intravenous Every 12 hours 08/04/21 1413 08/04/21 1621   08/04/21 1500  ceFEPIme (MAXIPIME) 2 g in sodium chloride 0.9 % 100 mL IVPB  Status:  Discontinued        2 g 200 mL/hr over 30 Minutes Intravenous Every 8 hours 08/04/21 1413 08/06/21 1058   08/03/21 1000  fluconazole (DIFLUCAN) tablet 200 mg  Status:  Discontinued        200 mg Oral Daily 08/03/21 0739 08/04/21 1347   08/02/21 2000  metroNIDAZOLE (FLAGYL) IVPB 500 mg  Status:  Discontinued        500 mg 100 mL/hr over 60 Minutes Intravenous Every 12 hours 08/02/21 1557 08/04/21 1413   08/02/21 1300  metroNIDAZOLE (FLAGYL) IVPB  500 mg  Status:  Discontinued        500 mg 100 mL/hr over 60 Minutes Intravenous Every 12 hours 08/02/21 1150 08/02/21 1557   08/02/21 1000  fluconazole (DIFLUCAN) IVPB 200 mg  Status:  Discontinued        200 mg 100 mL/hr over 60 Minutes Intravenous Every 24 hours 08/02/21 0827 08/03/21 0739   08/01/21 1000  azithromycin (ZITHROMAX) tablet 500 mg  Status:  Discontinued        500 mg Oral Daily 07/31/21 1438 08/03/21 1111   07/30/21 1000  azithromycin (ZITHROMAX) 500 mg in sodium chloride 0.9 % 250 mL IVPB  Status:  Discontinued        500 mg 250 mL/hr over 60 Minutes Intravenous Every 24 hours 07/30/21 0755 07/31/21 1438   07/30/21 0000  vancomycin (VANCOCIN) IVPB 1000 mg/200 mL premix  Status:  Discontinued        1,000 mg 200 mL/hr over 60 Minutes Intravenous Every 12 hours 07/29/21  6440 07/31/21 1436   07/29/21 1030  vancomycin (VANCOREADY) IVPB 2000 mg/400 mL        2,000 mg 200 mL/hr over 120 Minutes Intravenous  Once 07/29/21 0844 07/29/21 1447   07/29/21 1000  cefTRIAXone (ROCEPHIN) 2 g in sodium chloride 0.9 % 100 mL IVPB  Status:  Discontinued        2 g 200 mL/hr over 30 Minutes Intravenous Every 24 hours 07/29/21 0832 08/04/21 1413   07/28/21 1600  vancomycin (VANCOCIN) IVPB 1000 mg/200 mL premix        1,000 mg 200 mL/hr over 60 Minutes Intravenous  Once 07/28/21 1545 07/28/21 1800   07/28/21 1600  ceFEPIme (MAXIPIME) 2 g in sodium chloride 0.9 % 100 mL IVPB        2 g 200 mL/hr over 30 Minutes Intravenous  Once 07/28/21 1545 07/28/21 1701   07/28/21 1400  ceFAZolin (ANCEF) IVPB 2g/100 mL premix  Status:  Discontinued        2 g 200 mL/hr over 30 Minutes Intravenous Every 8 hours 07/28/21 1334 07/28/21 1545         Recent Results (from the past 240 hour(s))  Blood culture (routine single)     Status: None   Collection Time: 07/28/21 11:44 AM   Specimen: BLOOD LEFT ARM  Result Value Ref Range Status   Specimen Description   Final    BLOOD LEFT ARM Performed at  [facility], [address], [place], [place] [address]    Special Requests   Final    Blood Culture adequate volume Performed at [facility], [address], [place], [place] [address]    Culture   Final    NO GROWTH 5 DAYS Performed at [facility], [place] [address] [place], [place] [address]    Report Status 08/02/2021 FINAL  Final  Culture, blood (routine x 2)     Status: None   Collection Time: 07/28/21  2:03 PM   Specimen: BLOOD  Result Value Ref Range Status   Specimen Description   Final    BLOOD CENTRAL LINE BLOOD RIGHT ARM Performed at [facility], [address], [place], [place] [address]    Special Requests   Final    BOTTLES DRAWN AEROBIC AND ANAEROBIC Blood Culture adequate volume Performed at [facility], [address] [place], [place] [address]    Culture   Final    NO GROWTH 5 DAYS Performed at [facility], [place] [address] [place], [place] [address]    Report Status 08/02/2021 FINAL  Final  Resp Panel by RT-PCR (Flu A&B, Covid) Nasopharyngeal Swab     Status: None   Collection Time: 07/28/21  3:35 PM   Specimen: Nasopharyngeal Swab; Nasopharyngeal(NP) swabs in vial transport medium  Result Value Ref Range Status   SARS Coronavirus 2 by RT PCR NEGATIVE NEGATIVE Final    Comment: (NOTE) SARS-CoV-2 target nucleic acids are NOT DETECTED.  The SARS-CoV-2 RNA is generally detectable in upper respiratory specimens during the acute phase of infection. The lowest concentration of SARS-CoV-2 viral copies this assay can detect is 138 copies/mL. A negative result does not preclude SARS-Cov-2 infection and should not be used as the sole basis for treatment or other patient management decisions. A negative result may occur with  improper specimen collection/handling, submission of specimen other than nasopharyngeal swab, presence of viral mutation(s) within the areas  targeted by this assay, and inadequate number of viral copies(<138  copies/mL). A negative result must be combined with clinical observations, patient history, and epidemiological information. The expected result is Negative.  Fact Sheet for Patients:  EntrepreneurPulse.com.au  Fact Sheet for Healthcare Providers:  IncredibleEmployment.be  This test is no t yet approved or cleared by the [place] FDA and  has been authorized for detection and/or diagnosis of SARS-CoV-2 by FDA under an Emergency Use Authorization (EUA). This EUA will remain  in effect (meaning this test can be used) for the duration of the COVID-19 declaration under Section 564(b)(1) of the Act, 21 U.S.C.section 360bbb-3(b)(1), unless the authorization is terminated  or revoked sooner.       Influenza A by PCR NEGATIVE NEGATIVE Final   Influenza B by PCR NEGATIVE NEGATIVE Final    Comment: (NOTE) The Xpert Xpress SARS-CoV-2/FLU/RSV plus assay is intended as an aid in the diagnosis of influenza from Nasopharyngeal swab specimens and should not be used as a sole basis for treatment. Nasal washings and aspirates are unacceptable for Xpert Xpress SARS-CoV-2/FLU/RSV testing.  Fact Sheet for Patients: EntrepreneurPulse.com.au  Fact Sheet for Healthcare Providers: IncredibleEmployment.be  This test is not yet approved or cleared by the [place] FDA and has been authorized for detection and/or diagnosis of SARS-CoV-2 by FDA under an Emergency Use Authorization (EUA). This EUA will remain in effect (meaning this test can be used) for the duration of the COVID-19 declaration under Section 564(b)(1) of the Act, 21 U.S.C. section 360bbb-3(b)(1), unless the authorization is terminated or revoked.  Performed at [facility], [address], [place], [place] [address]   Expectorated Sputum Assessment w Gram Stain, Rflx to  Resp Cult     Status: None   Collection Time: 07/30/21  7:57 AM   Specimen: Sputum  Result Value Ref Range Status   Specimen Description SPUTUM  Final   Special Requests NONE  Final   Sputum evaluation   Final    THIS SPECIMEN IS ACCEPTABLE FOR SPUTUM CULTURE Performed at [facility], [place] [address], [place], [place] [address]    Report Status 07/30/2021 FINAL  Final  Culture, Respiratory w Gram Stain     Status: None   Collection Time: 07/30/21  7:57 AM   Specimen: SPU  Result Value Ref Range Status   Specimen Description   Final    SPUTUM Performed at [facility] [address], [place],  [address]    Special Requests   Final    NONE Reflexed from [id number] Performed at [facility] [address], [place], [place] [address]    Gram Stain   Final    RARE SQUAMOUS EPITHELIAL CELLS PRESENT RARE WBC PRESENT, PREDOMINANTLY PMN RARE GRAM NEGATIVE RODS    Culture   Final    RARE CANDIDA ALBICANS RARE VIRIDANS STREPTOCOCCUS Standardized susceptibility testing for this organism is not available. Performed at [facility], [place] [address], [place],  [address]    Report Status 08/01/2021 FINAL  Final  MRSA Next Gen by PCR, Nasal     Status: None   Collection Time: 07/31/21  8:12 AM   Specimen: Nasal Mucosa; Nasal Swab  Result Value Ref Range Status   MRSA by PCR Next Gen NOT DETECTED NOT DETECTED Final    Comment: (NOTE) The GeneXpert MRSA Assay (FDA approved for NASAL specimens only), is one component of a comprehensive MRSA colonization surveillance program. It is not intended to diagnose MRSA infection nor to guide or monitor treatment for  MRSA infections. Test performance is not FDA approved in patients less than 29 years old. Performed at [facility], [place] [address] [place], [place] [address]      PULMONARY Recent Labs  Lab 08/01/21 1530  PHART 7.411  PCO2ART 31.9*   PO2ART 114*  HCO3 19.9*  O2SAT 99.0    CBC Recent Labs  Lab 08/04/21 0917 08/05/21 0643 08/06/21 0453  HGB 9.4* 9.7* 9.6*  HCT 28.3* 29.9* 29.8*  WBC 6.3 4.5 8.1  PLT 203 191 247    COAGULATION No results for input(s): INR in the last 168 hours.   CARDIAC  No results for input(s): TROPONINI in the last 168 hours. No results for input(s): PROBNP in the last 168 hours.   CHEMISTRY Recent Labs  Lab 07/31/21 0400 08/01/21 0457 08/02/21 0321 08/03/21 0315 08/04/21 0917 08/05/21 0643 08/06/21 0453  NA 133* 130* 129* 129* 129* 134* 133*  K 4.1 3.9 3.4* 3.6 3.9 4.3 4.5  CL 103 104 99 97* 98 102 103  CO2 21* 21* 23 25 25 24 24   GLUCOSE 90 91 94 106* 107* 150* 132*  BUN 13 9 10 12 12 19 23   CREATININE 0.58* 0.64 0.61 0.63 0.67 0.70 0.68  CALCIUM 8.1* 8.0* 8.1* 8.3* 8.1* 8.8* 9.0  MG 1.8 1.7 1.8 1.7 1.8 2.3  --   PHOS 2.3*  --   --   --   --  4.1  --    Estimated Creatinine Clearance: 104.3 mL/min (by C-G formula based on SCr of 0.68 mg/dL).   LIVER Recent Labs  Lab 07/31/21 0400 08/05/21 0643 08/06/21 0453  AST  --  31 46*  ALT  --  13 20  ALKPHOS  --  61 65  BILITOT  --  0.7 0.5  PROT  --  6.8 6.6  ALBUMIN 1.8* 2.1* 2.1*     INFECTIOUS Recent Labs  Lab 08/04/21 0917 08/05/21 0643  PROCALCITON 0.31 0.19     ENDOCRINE CBG (last 3)  No results for input(s): GLUCAP in the last 72 hours.       IMAGING x48h  - image(s) personally visualized  -   highlighted in bold ECHOCARDIOGRAM COMPLETE  Result Date: 08/04/2021    ECHOCARDIOGRAM REPORT   Patient Name:   MEARLE DREW Date of Exam: 08/04/2021 Medical Rec #:  [id number]   Height:       73.5 in Accession #:    [id number]  Weight:       221.6 lb Date of Birth:  1946/09/03  BSA:          2.259 m Patient Age:    82 years    BP:           125/75 mmHg Patient Gender: M           HR:           101 bpm. Exam Location:  Inpatient Procedure: 2D Echo, Cardiac Doppler and Color Doppler Indications:    [place]  respiratory distress R06.03  History:        Patient has prior history of Echocardiogram examinations, most                 recent 02/25/2021. Stroke, Arrythmias:Atrial Fibrillation; Risk                 Factors:Dyslipidemia and Hypertension.  Sonographer:    Bernadene Person RDCS Referring Phys: [place]  1. Left ventricular ejection fraction, by estimation,  is 65 to 70%. The left ventricle has normal function. The left ventricle has no regional wall motion abnormalities. There is mild left ventricular hypertrophy. Left ventricular diastolic parameters are indeterminate.  2. Right ventricular systolic function mild to moderately reduced. The right ventricular size is mildly enlarged. There is normal pulmonary artery systolic pressure.  3. Trivial mitral valve regurgitation.  4. The aortic valve is tricuspid. Aortic valve regurgitation is not visualized. Mild aortic valve sclerosis is present, with no evidence of aortic valve stenosis.  5. Aortic dilatation noted. There is mild dilatation of the ascending aorta, measuring 40 mm.  6. The inferior vena cava is normal in size with greater than 50% respiratory variability, suggesting right atrial pressure of 3 mmHg. FINDINGS  Left Ventricle: Left ventricular ejection fraction, by estimation, is 65 to 70%. The left ventricle has normal function. The left ventricle has no regional wall motion abnormalities. The left ventricular internal cavity size was normal in size. There is  mild left ventricular hypertrophy. Left ventricular diastolic parameters are indeterminate. Right Ventricle: The right ventricular size is mildly enlarged. Right vetricular wall thickness was not assessed. Right ventricular systolic function mild to moderately reduced. There is normal pulmonary artery systolic pressure. The tricuspid regurgitant velocity is 2.24 m/s, and with an assumed right atrial pressure of 3 mmHg, the estimated right ventricular systolic pressure is 48.8  mmHg. Left Atrium: Left atrial size was normal in size. Right Atrium: Right atrial size was normal in size. Pericardium: Trivial pericardial effusion is present. Presence of pericardial fat pad. Mitral Valve: There is mild thickening of the mitral valve leaflet(s). Mild mitral annular calcification. Trivial mitral valve regurgitation. Tricuspid Valve: The tricuspid valve is normal in structure. Tricuspid valve regurgitation is trivial. Aortic Valve: The aortic valve is tricuspid. Aortic valve regurgitation is not visualized. Mild aortic valve sclerosis is present, with no evidence of aortic valve stenosis. Pulmonic Valve: The pulmonic valve was normal in structure. Pulmonic valve regurgitation is mild to moderate. Aorta: Aortic dilatation noted. There is mild dilatation of the ascending aorta, measuring 40 mm. Venous: The inferior vena cava is normal in size with greater than 50% respiratory variability, suggesting right atrial pressure of 3 mmHg. IAS/Shunts: No atrial level shunt detected by color flow Doppler.  LEFT VENTRICLE PLAX 2D LVIDd:         4.20 cm   Diastology LVIDs:         2.60 cm   LV e' medial:    5.33 cm/s LV PW:         1.00 cm   LV E/e' medial:  10.7 LV IVS:        1.20 cm   LV e' lateral:   5.68 cm/s LVOT diam:     2.40 cm   LV E/e' lateral: 10.1 LV SV:         55 LV SV Index:   24 LVOT Area:     4.52 cm  RIGHT VENTRICLE RV S prime:     19.10 cm/s TAPSE (M-mode): 1.9 cm LEFT ATRIUM           Index        RIGHT ATRIUM           Index LA diam:      2.70 cm 1.20 cm/m   RA Area:     17.30 cm LA Vol (A2C): 34.1 ml 15.10 ml/m  RA Volume:   46.70 ml  20.68 ml/m LA Vol (A4C): 34.2 ml 15.14 ml/m  AORTIC VALVE LVOT Vmax:   81.80 cm/s LVOT Vmean:  54.400 cm/s LVOT VTI:    0.121 m  AORTA Ao Root diam: 3.40 cm Ao Asc diam:  4.00 cm MITRAL VALVE               TRICUSPID VALVE MV Area (PHT): 4.21 cm    TR Peak grad:   20.1 mmHg MV Decel Time: 180 msec    TR Vmax:        224.00 cm/s MV E velocity: 57.20  cm/s MV A velocity: 86.80 cm/s  SHUNTS MV E/A ratio:  0.66        Systemic VTI:  0.12 m                            Systemic Diam: 2.40 cm Dorris Carnes MD Electronically signed by Dorris Carnes MD Signature Date/Time: 08/04/2021/5:53:27 PM    Final       Resolved Hospital Problem list   x  Assessment & Plan:  Chronic left lower lobe atelectasis/consolidation - Prior to & Present on Admit -seen on CT scan chest May 2022 OSA on cPAP - Prior to & Present on Admit Stage 4 NSCLC- on keytruda - Prior to & Present on Admit    Acute hypoxemic respiratory failure - present at admit Significant physical deconditioning - present at admit -in the setting of cellulitis and potential aspiration.   Strep viridans (rare) without endocaditis clinical profiole and rare candida in sputum c;w aspiration 07/30/21 - Onc concerned about keytruda pneumonitis and started prednisome 144m on11/1/22    Plan - Antibiotics  Unasyn anaerobic coverage (d/w ID - total 7 days_ d/w CEustace Moorepharmacist - Await QuantiFERON gold - O2 for pulse ox > 92% - night cPAP for his OSA (no need for day or night biPAP) --Re keytruda pneumonitis   - started on prednisome high dose 08/04/21 - clinically h has presented with cellulitis and aspiration. So CCM no longer sure about keytruda pneumonitis. Reached out to Dr MJulien Nordmann Patient aware of risk of steroids but benefit likely outweighs risk. 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 - Might need LTAC due to o2, prednisone and deconditioning (care mamagnent consult called) - will need ccm folllowup as opd at [facility] APP  - ccm will sign off   - Antibiotics Anti-infectives (From admission, onward)    Start     Dose/Rate Route Frequency Ordered Stop   08/06/21 1215  cefTRIAXone (ROCEPHIN) 2 g in sodium chloride 0.9 % 100 mL IVPB        2 g 200 mL/hr over 30 Minutes  Intravenous Every 24 hours 08/06/21 1120 08/11/21 1214   08/06/21 1215  metroNIDAZOLE (FLAGYL) IVPB 500 mg        500 mg 100 mL/hr over 60 Minutes Intravenous 2 times daily 08/06/21 1120 08/13/21 0959   08/04/21 2000  metroNIDAZOLE (FLAGYL) IVPB 500 mg  Status:  Discontinued        500 mg 100 mL/hr over 60 Minutes Intravenous Every 12 hours 08/04/21 1413 08/04/21 1621   08/04/21 1500  ceFEPIme (MAXIPIME) 2 g in sodium chloride 0.9 % 100 mL IVPB  Status:  Discontinued        2 g 200 mL/hr over 30 Minutes Intravenous Every 8 hours 08/04/21 1413 08/06/21 1058   08/03/21 1000  fluconazole (DIFLUCAN) tablet 200 mg  Status:  Discontinued        200 mg Oral Daily 08/03/21 0739 08/04/21 1347   08/02/21 2000  metroNIDAZOLE (FLAGYL) IVPB 500 mg  Status:  Discontinued        500 mg 100 mL/hr over 60 Minutes Intravenous Every 12 hours 08/02/21 1557 08/04/21 1413   08/02/21 1300  metroNIDAZOLE (FLAGYL) IVPB 500 mg  Status:  Discontinued        500 mg 100 mL/hr over 60 Minutes Intravenous Every 12 hours 08/02/21 1150 08/02/21 1557   08/02/21 1000  fluconazole (DIFLUCAN) IVPB 200 mg  Status:  Discontinued        200 mg 100 mL/hr over 60 Minutes Intravenous Every 24 hours 08/02/21 0827 08/03/21 0739   08/01/21 1000  azithromycin (ZITHROMAX) tablet 500 mg  Status:  Discontinued        500 mg Oral Daily 07/31/21 1438 08/03/21 1111   07/30/21 1000  azithromycin (ZITHROMAX) 500 mg in sodium chloride 0.9 % 250 mL IVPB  Status:  Discontinued        500 mg 250 mL/hr over 60 Minutes Intravenous Every 24 hours 07/30/21 0755 07/31/21 1438   07/30/21 0000  vancomycin (VANCOCIN) IVPB 1000 mg/200 mL premix  Status:  Discontinued        1,000 mg 200 mL/hr over 60 Minutes Intravenous Every 12 hours 07/29/21 0854 07/31/21 1436   07/29/21 1030  vancomycin (VANCOREADY) IVPB 2000 mg/400 mL        2,000 mg 200 mL/hr over 120 Minutes Intravenous  Once 07/29/21 0844 07/29/21 1447   07/29/21 1000  cefTRIAXone (ROCEPHIN) 2  g in sodium chloride 0.9 % 100 mL IVPB  Status:  Discontinued        2 g 200 mL/hr over 30 Minutes Intravenous Every 24 hours 07/29/21 0832 08/04/21 1413   07/28/21 1600  vancomycin (VANCOCIN) IVPB 1000 mg/200 mL premix        1,000 mg 200 mL/hr over 60 Minutes Intravenous  Once 07/28/21 1545 07/28/21 1800   07/28/21 1600  ceFEPIme (MAXIPIME) 2 g in sodium chloride 0.9 % 100 mL IVPB        2 g 200 mL/hr over 30 Minutes Intravenous  Once 07/28/21 1545 07/28/21 1701   07/28/21 1400  ceFAZolin (ANCEF) IVPB 2g/100 mL premix  Status:  Discontinued        2 g 200 mL/hr over 30 Minutes Intravenous Every 8 hours 07/28/21 1334 07/28/21 1545        Xxxx   CCM   Best Practice (right click and "Reselect all SmartList Selections" daily)  According to the hospitalist [facility] Future Appointments  Date Time Provider [place]  08/17/2021 11:30 AM [facility] DEVICE REMOTES [facility]  08/19/2021  9:30 AM Brand Males, MD [facility] None  08/19/2021  2:00 PM [facility] NL VASC 2 [facility]  08/25/2021  8:45 AM [facility] [place] FLUSH [facility] None  08/25/2021  9:15 AM Curt Bears, MD [facility] None  08/25/2021 10:30 AM [facility] INFUSION [facility] None  09/15/2021 10:30 AM [facility] [place] FLUSH [facility] None  09/15/2021 11:00 AM Curt Bears, MD [facility] None  09/15/2021 11:45 AM [facility] INFUSION [facility] None  09/21/2021 11:30 AM [facility] DEVICE REMOTES [facility]  10/26/2021 11:30 AM [facility] DEVICE REMOTES [facility]  11/30/2021 11:30 AM [facility] DEVICE REMOTES [facility]  12/11/2021 10:45 AM Tat, Eustace Quail, DO [facility] None  01/04/2022 11:30 AM [facility] DEVICE REMOTES [facility]  ATTESTATION & SIGNATURE  Dr. Brand Males, M.D., [facility] Pulmonary and Critical Care Medicine Staff Physician [facility] Pager: [phone], If no answer or between  15:00h - 7:00h: call [phone]  08/06/2021 11:31 AM

## 2021-08-06 NOTE — Progress Notes (Signed)
PROGRESS NOTE    Corey [name]  RXV:[id number] DOB: 07-10-1946 DOA: 07/28/2021 PCP: [name], MD    Brief Narrative:  75 year old gentleman history of non-small cell lung cancer on chemotherapy, OSA, A. fib, coronary artery disease presented with left lower extremity swelling and generalized weakness.  Patient noted to have recently been treated with Levaquin for pneumonia which she completed the day prior to his erythema.  Patient presented with shortness of breath from pneumonia.  Chest x-ray done showed unchanged left lower lobe opacity, no fevers recorded, patient on also noted with a left lower extremity cellulitis.  Patient placed on IV vancomycin cefepime and admitted for further evaluation and management.  Patient noted the night of admission 12 intractable nausea and vomiting and abdominal films and chest x-ray ordered.  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 -Was on IV vancomycin and IV Rocephin.   -IV vancomycin discontinued.   -Blood cultures with no growth to date.   -Was on IV Rocephin which has been changed to cefepime due to problem #2.   -Cellulitis has been treated with a full course of antibiotics at this time.  2.  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 -It is noted that patient had a sats of 77% on room air the evening on 07/31/2021, patient noted to have required up to 8 L high flow nasal cannula on 08/01/2021.   -BNP at 24.3.  -ABG with a pH of 7.4/PCO2 of 32/PO2 of 114/bicarb of 20.   -Patient placed on Lasix 40 mg IV every 12 hours with urine output of 2.5 L. -Some initial clinical improvement on 08/02/2021 however patient with worsening hypoxia 08/03/2021 with sats of 95% on 6 L nasal cannula laying supine, when patient moved to edge of bed sats of 83% on 6 L with some complaints of dizziness.  -Sputum gram stain and culture with rare Candida albicans, viridans Streptococcus.. -Sed rate > 140.  -Appreciate input by PCCM as well as oncology.  Recommendation for starting Bactrim prophylaxis as G6PD is normal, patient remains on high-dose steroids for Keytruda pneumonitis per oncology recommendations. -Oncology has recommended Unasyn and neuropathic coverage for total of 7 days -Continue scheduled nebs. -Continue BiPAP nightly. -PCCM and oncology have been following.  Pulmonary has since signed off  3.  Nausea vomiting -Patient noted to have intractable nausea vomiting the evening of 07/29/2021, morning of 07/30/2021 . -Patient placed on IV Compazine and IV Zofran with clinical improvement.  -Patient stated had a bowel movement.   -Improving clinically with no further nausea or vomiting and tolerating clear liquids. -Abdominal films with  nonobstructive bowel gas pattern, mild gaseous distention of the stomach.   -Chest x-ray with new faint patchy opacity right midlung, differential includes mild atelectasis, aspiration or pneumonia.  Stable volume loss and patchy consolidation in the mid to lower left lung compatible with posttreatment  change. -Patient currently tolerating dysphagia 3 diet, tolerating thus far -Seen by SLP and underwent MBS.     4.  aspiration pneumonia/CAP versus BOOP/drug-induced pneumonitis.  -Patient noted with intractable nausea and vomiting the evening of 07/29/2021 and in the morning of 07/30/2021.  -Repeat chest x-ray done 08/01/2021 with patchy AICD in the right lung corresponds to groundglass opacities on CT, with mild interval progression.  Chronic volume loss in the left hemothorax with pleural parenchymal opacity in the left lower lung.  -MRSA PCR negative.   -Sputum gram stain and culture with rare Candida albicans, rare variant Streptococcus. -IV vancomycin discontinued.    -Due to acute respiratory failure with hypoxia and worsening chest x-ray with concerns for aspiration pneumonia antibiotics was broadened with addition of IV Flagyl to IV Rocephin and IV azithromycin.   -Patient status post course of azithromycin. -Patient with worsening ongoing hypoxia with sats in the 83% on 6 L nasal cannula with exertion as noted on PT note on 08/03/2021.   -Repeat chest x-ray with extensive bilateral pneumonia as previously seen with increasing infiltrates on the right since prior study.  -Continue Mucinex, Brovana, Flonase, Brovana, Pulmicort. -Per above, recommendation for total of 7 days of Unasyn per pulmonary recommendations at this time -PCCM following and appreciate input and recommendations. -Recommendations for continued BiPAP support per pulmonary  5.  Hypokalemia/hypomagnesemia/hyponatremia -Improving with hydration. -Potassium at 3.9, magnesium at 1.8.   -Magnesium sulfate 4 g IV x1.   -Patient on Tikosyn and as such goal is to keep potassium around 4, magnesium at 2.   6.  Stage IV non-small cell lung cancer -On palliative chemotherapy per oncology, Dr. Lorna Few. -Continue nebs/inhalers. -Patient's oncologist ordered staging CT scans on 07/31/2021.   -Oncology informed of  admission via epic. -Patient seen by oncology today 08/04/2021. -Concerned that patient may have Keytruda induced pneumonitis and as such this will not be resumed on discharge per oncology.  7.  Paroxysmal atrial fibrillation -Patient with intractable nausea vomiting unable to tolerate any oral intake early on during the hospitalization which has since resolved. -Patient transitioned from IV Lopressor to oral home dose Lopressor.  -Continue Tikosyn.   -Keep potassium approximately 4, keep magnesium approximately 2.   -Was on full dose Lovenox and transitioned back to home dose Xarelto, currently stable 8.  Hyperlipidemia -Lipitor, fenofibrate.   9.  Coronary artery disease/hypertension -IV metoprolol has been transition back to home dose oral metoprolol.  Continue statin, anticoagulation.   10.  Parkinson's -Sinemet  11.  Prolonged QT -Repeat EKG with resolution of QT prolongation currently at 467 from 530 on admission. -Magnesium currently at 1.8, potassium at 3.9.   -Magnesium sulfate 4 g IV x1. -Keep potassium approximately at 4, magnesium approximately 2.  12.  OSA -Nausea and vomiting improved no further nausea and vomiting noted.   -Was on CPAP nightly however due to respiratory issues has been placed on BiPAP.  13.  History of CVA -No overt bleeding.  -Xarelto for secondary stroke prophylaxis.   -Continued on statin.   14.  Generalized weakness -PT/OT. -PT recommending SNF however patient and family would prefer to go home with home health therapies. -Initial PT recommendations for CIR.  However, patient reports he  is unable to tolerate 3 hours of intensive therapy per day.  In fact, patient states he struggles to stand or even sit at the edge of the bed for more than a minute at a time -Transitions of care following, plan for possible LTAC  15.  Anemia -Likely secondary to chemotherapy. -Patient with no overt bleeding. -Hemoglobin at 7.0 on 08/02/2021. -Status post  transfusion 2 units packed red blood cells 08/02/2021  -Patient remains hemodynamically stable   DVT prophylaxis: Xarelto Code Status: DNR Family Communication: Pt in room, family not at bedside  Status is: Inpatient  Remains inpatient appropriate because: severity of ill ness    Consultants:  PCCM Oncology  Procedures:  Abdominal x-ray 07/30/2021 Chest x-ray 07/30/2021, 08/01/2021 Bilateral lower extremity Dopplers 07/30/2021 CT chest/CT abdomen and pelvis 07/31/2021 Transfused 2 units packed red blood cells 08/02/2021  Antimicrobials: Anti-infectives (From admission, onward)    Start     Dose/Rate Route Frequency Ordered Stop   08/06/21 1215  cefTRIAXone (ROCEPHIN) 2 g in sodium chloride 0.9 % 100 mL IVPB        2 g 200 mL/hr over 30 Minutes Intravenous Every 24 hours 08/06/21 1120 08/11/21 1214   08/06/21 1215  metroNIDAZOLE (FLAGYL) IVPB 500 mg        500 mg 100 mL/hr over 60 Minutes Intravenous 2 times daily 08/06/21 1120 08/13/21 0959   08/04/21 2000  metroNIDAZOLE (FLAGYL) IVPB 500 mg  Status:  Discontinued        500 mg 100 mL/hr over 60 Minutes Intravenous Every 12 hours 08/04/21 1413 08/04/21 1621   08/04/21 1500  ceFEPIme (MAXIPIME) 2 g in sodium chloride 0.9 % 100 mL IVPB  Status:  Discontinued        2 g 200 mL/hr over 30 Minutes Intravenous Every 8 hours 08/04/21 1413 08/06/21 1058   08/03/21 1000  fluconazole (DIFLUCAN) tablet 200 mg  Status:  Discontinued        200 mg Oral Daily 08/03/21 0739 08/04/21 1347   08/02/21 2000  metroNIDAZOLE (FLAGYL) IVPB 500 mg  Status:  Discontinued        500 mg 100 mL/hr over 60 Minutes Intravenous Every 12 hours 08/02/21 1557 08/04/21 1413   08/02/21 1300  metroNIDAZOLE (FLAGYL) IVPB 500 mg  Status:  Discontinued        500 mg 100 mL/hr over 60 Minutes Intravenous Every 12 hours 08/02/21 1150 08/02/21 1557   08/02/21 1000  fluconazole (DIFLUCAN) IVPB 200 mg  Status:  Discontinued        200 mg 100 mL/hr over 60  Minutes Intravenous Every 24 hours 08/02/21 0827 08/03/21 0739   08/01/21 1000  azithromycin (ZITHROMAX) tablet 500 mg  Status:  Discontinued        500 mg Oral Daily 07/31/21 1438 08/03/21 1111   07/30/21 1000  azithromycin (ZITHROMAX) 500 mg in sodium chloride 0.9 % 250 mL IVPB  Status:  Discontinued        500 mg 250 mL/hr over 60 Minutes Intravenous Every 24 hours 07/30/21 0755 07/31/21 1438   07/30/21 0000  vancomycin (VANCOCIN) IVPB 1000 mg/200 mL premix  Status:  Discontinued        1,000 mg 200 mL/hr over 60 Minutes Intravenous Every 12 hours 07/29/21 0854 07/31/21 1436   07/29/21 1030  vancomycin (VANCOREADY) IVPB 2000 mg/400 mL        2,000 mg 200 mL/hr over 120 Minutes Intravenous  Once 07/29/21 0844 07/29/21 1447   07/29/21 1000  cefTRIAXone (  ROCEPHIN) 2 g in sodium chloride 0.9 % 100 mL IVPB  Status:  Discontinued        2 g 200 mL/hr over 30 Minutes Intravenous Every 24 hours 07/29/21 0832 08/04/21 1413   07/28/21 1600  vancomycin (VANCOCIN) IVPB 1000 mg/200 mL premix        1,000 mg 200 mL/hr over 60 Minutes Intravenous  Once 07/28/21 1545 07/28/21 1800   07/28/21 1600  ceFEPIme (MAXIPIME) 2 g in sodium chloride 0.9 % 100 mL IVPB        2 g 200 mL/hr over 30 Minutes Intravenous  Once 07/28/21 1545 07/28/21 1701   07/28/21 1400  ceFAZolin (ANCEF) IVPB 2g/100 mL premix  Status:  Discontinued        2 g 200 mL/hr over 30 Minutes Intravenous Every 8 hours 07/28/21 1334 07/28/21 1545       Subjective: Reports feeling slightly stronger today, reports working with physical therapy today, feeling motivated to get stronger  Objective: Vitals:   08/06/21 0611 08/06/21 0841 08/06/21 1117 08/06/21 1131  BP: 123/80  115/75   Pulse: 72  87 98  Resp: 20  19   Temp: 98 F (36.7 C)  97.8 F (36.6 C)   TempSrc:   Oral   SpO2: 98% 94% 95% (!) 89%  Weight: 105.8 kg     Height:        Intake/Output Summary (Last 24 hours) at 08/06/2021 1800 Last data filed at 08/06/2021  1725 Gross per 24 hour  Intake 850.87 ml  Output 2875 ml  Net -2024.13 ml    Filed Weights   08/04/21 0443 08/05/21 0507 08/06/21 0611  Weight: 100.5 kg 98.7 kg 105.8 kg    Examination: General exam: Conversant, in no acute distress Respiratory system: normal chest rise, clear, no audible wheezing Cardiovascular system: regular rhythm, s1-s2 Gastrointestinal system: Nondistended, nontender, pos BS Central nervous system: No seizures, no tremors Extremities: No cyanosis, no joint deformities Skin: No rashes, no pallor Psychiatry: Affect normal // no auditory hallucinations   Data Reviewed: I have personally reviewed following labs and imaging studies  CBC: Recent Labs  Lab 08/01/21 0457 08/02/21 0321 08/03/21 0315 08/04/21 0917 08/05/21 0643 08/06/21 0453  WBC 6.4 6.3 6.2 6.3 4.5 8.1  NEUTROABS 5.4 5.3 5.3 5.1 4.1  --   HGB 7.3* 7.0* 8.9* 9.4* 9.7* 9.6*  HCT 22.2* 21.6* 26.7* 28.3* 29.9* 29.8*  MCV 96.5 96.0 94.0 96.6 98.4 98.3  PLT 140* 146* 148* 203 191 466    Basic Metabolic Panel: Recent Labs  Lab 07/31/21 0400 08/01/21 0457 08/02/21 0321 08/03/21 0315 08/04/21 0917 08/05/21 0643 08/06/21 0453  NA 133* 130* 129* 129* 129* 134* 133*  K 4.1 3.9 3.4* 3.6 3.9 4.3 4.5  CL 103 104 99 97* 98 102 103  CO2 21* 21* 23 25 25 24 24   GLUCOSE 90 91 94 106* 107* 150* 132*  BUN 13 9 10 12 12 19 23   CREATININE 0.58* 0.64 0.61 0.63 0.67 0.70 0.68  CALCIUM 8.1* 8.0* 8.1* 8.3* 8.1* 8.8* 9.0  MG 1.8 1.7 1.8 1.7 1.8 2.3  --   PHOS 2.3*  --   --   --   --  4.1  --     GFR: Estimated Creatinine Clearance: 104.3 mL/min (by C-G formula based on SCr of 0.68 mg/dL). Liver Function Tests: Recent Labs  Lab 07/31/21 0400 08/05/21 0643 08/06/21 0453  AST  --  31 46*  ALT  --  13 20  ALKPHOS  --  61 65  BILITOT  --  0.7 0.5  PROT  --  6.8 6.6  ALBUMIN 1.8* 2.1* 2.1*    No results for input(s): LIPASE, AMYLASE in the last 168 hours. No results for input(s): AMMONIA in  the last 168 hours. Coagulation Profile: No results for input(s): INR, PROTIME in the last 168 hours. Cardiac Enzymes: No results for input(s): CKTOTAL, CKMB, CKMBINDEX, TROPONINI in the last 168 hours. BNP (last 3 results) No results for input(s): PROBNP in the last 8760 hours. HbA1C: No results for input(s): HGBA1C in the last 72 hours. CBG: No results for input(s): GLUCAP in the last 168 hours. Lipid Profile: No results for input(s): CHOL, HDL, LDLCALC, TRIG, CHOLHDL, LDLDIRECT in the last 72 hours. Thyroid Function Tests: No results for input(s): TSH, T4TOTAL, FREET4, T3FREE, THYROIDAB in the last 72 hours. Anemia Panel: No results for input(s): VITAMINB12, FOLATE, FERRITIN, TIBC, IRON, RETICCTPCT in the last 72 hours. Sepsis Labs: Recent Labs  Lab 08/04/21 0917 08/05/21 0643  PROCALCITON 0.31 0.19     Recent Results (from the past 240 hour(s))  Blood culture (routine single)     Status: None   Collection Time: 07/28/21 11:44 AM   Specimen: BLOOD LEFT ARM  Result Value Ref Range Status   Specimen Description   Final    BLOOD LEFT ARM Performed at [facility], [address] [place], [place] [address]    Special Requests   Final    Blood Culture adequate volume Performed at [facility], [address] [place], [place] [address]    Culture   Final    NO GROWTH 5 DAYS Performed at [facility], [place] [address], [place], [place] [address]    Report Status 08/02/2021 FINAL  Final  Culture, blood (routine x 2)     Status: None   Collection Time: 07/28/21  2:03 PM   Specimen: BLOOD  Result Value Ref Range Status   Specimen Description   Final    BLOOD CENTRAL LINE BLOOD RIGHT ARM Performed at [facility], [address] [place], [place] [address]    Special Requests   Final    BOTTLES DRAWN AEROBIC AND ANAEROBIC Blood Culture adequate volume Performed at [facility], [address], [place], [place] [address]    Culture   Final    NO GROWTH 5 DAYS Performed at [facility], [place] [address], [place], [place] [address]    Report Status 08/02/2021 FINAL  Final  Resp Panel by RT-PCR (Flu A&B, Covid) Nasopharyngeal Swab     Status: None   Collection Time: 07/28/21  3:35 PM   Specimen: Nasopharyngeal Swab; Nasopharyngeal(NP) swabs in vial transport medium  Result Value Ref Range Status   SARS Coronavirus 2 by RT PCR NEGATIVE NEGATIVE Final    Comment: (NOTE) SARS-CoV-2 target nucleic acids are NOT DETECTED.  The SARS-CoV-2 RNA is generally detectable in upper respiratory specimens during the acute phase of infection. The lowest concentration of SARS-CoV-2 viral copies this assay can detect is 138 copies/mL. A negative result does not preclude SARS-Cov-2 infection and should not be used as the sole basis for treatment or other patient management decisions. A negative result may occur with  improper specimen collection/handling, submission of specimen other than nasopharyngeal swab, presence of viral mutation(s) within the areas targeted by this assay, and inadequate number of viral copies(<138 copies/mL). A negative result must be combined with clinical observations, patient history,  and epidemiological information. The expected result is Negative.  Fact Sheet for Patients:  EntrepreneurPulse.com.au  Fact Sheet for Healthcare Providers:  IncredibleEmployment.be  This test is no t yet approved or cleared by the [place] FDA and  has been authorized for detection and/or diagnosis of SARS-CoV-2 by FDA under an Emergency Use Authorization (EUA). This EUA will remain  in effect (meaning this test can be used) for the duration of the COVID-19 declaration under Section 564(b)(1) of the Act, 21 U.S.C.section 360bbb-3(b)(1), unless the authorization is terminated  or revoked sooner.       Influenza A by PCR  NEGATIVE NEGATIVE Final   Influenza B by PCR NEGATIVE NEGATIVE Final    Comment: (NOTE) The Xpert Xpress SARS-CoV-2/FLU/RSV plus assay is intended as an aid in the diagnosis of influenza from Nasopharyngeal swab specimens and should not be used as a sole basis for treatment. Nasal washings and aspirates are unacceptable for Xpert Xpress SARS-CoV-2/FLU/RSV testing.  Fact Sheet for Patients: EntrepreneurPulse.com.au  Fact Sheet for Healthcare Providers: IncredibleEmployment.be  This test is not yet approved or cleared by the [place] FDA and has been authorized for detection and/or diagnosis of SARS-CoV-2 by FDA under an Emergency Use Authorization (EUA). This EUA will remain in effect (meaning this test can be used) for the duration of the COVID-19 declaration under Section 564(b)(1) of the Act, 21 U.S.C. section 360bbb-3(b)(1), unless the authorization is terminated or revoked.  Performed at [facility], [address], [place], [place] [address]   Expectorated Sputum Assessment w Gram Stain, Rflx to Resp Cult     Status: None   Collection Time: 07/30/21  7:57 AM   Specimen: Sputum  Result Value Ref Range Status   Specimen Description SPUTUM  Final   Special Requests NONE  Final   Sputum evaluation   Final    THIS SPECIMEN IS ACCEPTABLE FOR SPUTUM CULTURE Performed at [facility], [place] [address] [place], [place] [address]    Report Status 07/30/2021 FINAL  Final  Culture, Respiratory w Gram Stain     Status: None   Collection Time: 07/30/21  7:57 AM   Specimen: SPU  Result Value Ref Range Status   Specimen Description   Final    SPUTUM Performed at [facility] [address] [place], [place] [address]    Special Requests   Final    NONE Reflexed from [id number] Performed at [facility] [address] [place], [place] [address]    Gram Stain   Final     RARE SQUAMOUS EPITHELIAL CELLS PRESENT RARE WBC PRESENT, PREDOMINANTLY PMN RARE GRAM NEGATIVE RODS    Culture   Final    RARE CANDIDA ALBICANS RARE VIRIDANS STREPTOCOCCUS Standardized susceptibility testing for this organism is not available. Performed at [facility], [place] [address], [place], [place] [address]    Report Status 08/01/2021 FINAL  Final  MRSA Next Gen by PCR, Nasal     Status: None   Collection Time: 07/31/21  8:12 AM   Specimen: Nasal Mucosa; Nasal Swab  Result Value Ref Range Status   MRSA by PCR Next Gen NOT DETECTED NOT DETECTED Final    Comment: (NOTE) The GeneXpert MRSA Assay (FDA approved for NASAL specimens only), is one component of a comprehensive MRSA colonization surveillance program. It is not intended to diagnose MRSA infection nor to guide or monitor treatment for MRSA infections. Test performance is not FDA approved in patients less  than 65 years old. Performed at [facility], [place] [address] [place], [place] [address]       Radiology Studies: No results found.  Scheduled Meds:  acidophilus   Oral Daily   arformoterol  15 mcg Nebulization BID   atorvastatin  40 mg Oral Daily   budesonide (PULMICORT) nebulizer solution  0.5 mg Nebulization BID   carbidopa-levodopa  1 tablet Oral 3 times per day   chlorhexidine  15 mL Mouth Rinse BID   Chlorhexidine Gluconate Cloth  6 each Topical Daily   dofetilide  500 mcg Oral BID   fenofibrate  160 mg Oral Daily   fluticasone  2 spray Each Nare Daily   folic acid  1 mg Oral Daily   guaiFENesin  1,200 mg Oral BID   ipratropium  1-2 spray Each Nare Q12H   ipratropium  0.5 mg Nebulization TID   levalbuterol  0.63 mg Nebulization TID   magnesium oxide  400 mg Oral Daily   mouth rinse  15 mL Mouth Rinse q12n4p   metoprolol succinate  25 mg Oral Daily   multivitamin with minerals  1 tablet Oral Daily   pantoprazole  40 mg Oral Daily   potassium chloride  10 mEq Oral Daily    predniSONE  100 mg Oral Q breakfast   rivaroxaban  20 mg Oral Q supper   tamsulosin  0.4 mg Oral QPC supper   topiramate  100 mg Oral BID   Continuous Infusions:  cefTRIAXone (ROCEPHIN)  IV 2 g (08/06/21 1308)   metronidazole 500 mg (08/06/21 1317)     LOS: 8 days   Marylu Lund, MD [facility] Pager On Amion  If 7PM-7AM, please contact night-coverage 08/06/2021, 6:00 PM

## 2021-08-06 NOTE — Progress Notes (Signed)
SATURATION QUALIFICATIONS: (This note is used to comply with regulatory documentation for home oxygen)  Patient Saturations on 3L [place] = 95%  Patient Saturations on 3L [place] while Ambulating = 83%  Patient Saturations on 4 Liters of oxygen while Ambulating = 89%  Please briefly explain why patient needs home oxygen:

## 2021-08-06 NOTE — Plan of Care (Signed)
  Problem: Health Behavior/Discharge Planning: Goal: Ability to manage health-related needs will improve Outcome: Progressing   

## 2021-08-06 NOTE — TOC Progression Note (Signed)
Transition of Care [facility]) - Progression Note    Patient Details  Name: Corey [name] MRN: [id number] Date of Birth: 1946-03-31  Transition of Care [facility]) CM/SW Contact  Bevan Vu, Juliann Pulse, RN Phone Number: 08/06/2021, 2:23 PM  Clinical Narrative:  Patient/spouse in agreement to [facility] @ [facility] rep Anderson [place] following for [facility].    Expected Discharge Plan: Long Term Acute Care (LTAC) Barriers to Discharge: Insurance Authorization  Expected Discharge Plan and Services Expected Discharge Plan: [place] (LTAC)   Discharge Planning Services: CM Consult   Living arrangements for the past 2 months: Single Family Home                           HH Arranged: PT, OT, Social Work [employer] Agency: [place] Date [place]: 07/31/21 Time [place]: 1412 Representative spoke with at [place]: Cherl rose   Social Determinants of Health ([place]) Interventions    Readmission Risk Interventions Readmission Risk Prevention Plan 08/05/2021  Transportation Screening Complete  PCP or Specialist appointment within 3-5 days of discharge Complete  HRI or [place] Complete  SW Recovery Care/Counseling Consult Complete  [place] Not Applicable  Some recent data might be hidden

## 2021-08-07 DIAGNOSIS — L03116 Cellulitis of left lower limb: Secondary | ICD-10-CM | POA: Diagnosis not present

## 2021-08-07 DIAGNOSIS — I480 Paroxysmal atrial fibrillation: Secondary | ICD-10-CM | POA: Diagnosis not present

## 2021-08-07 NOTE — TOC Progression Note (Signed)
Transition of Care [facility]) - Progression Note    Patient Details  Name: Corey [name] MRN: [id number] Date of Birth: 08/28/1946  Transition of Care [facility]) CM/SW Contact  Jep Dyas, Juliann Pulse, RN Phone Number: 08/07/2021, 2:22 PM  Clinical Narrative: Dr. Audley Hose to peer requested if you agree for [facility]. Please call [facility] [phone], [phone] expires 08/10/21 by 5p.        Expected Discharge Plan: Long Term Acute Care (LTAC) Barriers to Discharge: Insurance Authorization  Expected Discharge Plan and Services Expected Discharge Plan: [place] (LTAC)   Discharge Planning Services: CM Consult   Living arrangements for the past 2 months: Single Family Home                           HH Arranged: PT, OT, Social Work [employer] Agency: [place] Date [place]: 07/31/21 Time [place]: 1412 Representative spoke with at [place]: Cherl rose   Social Determinants of Health ([place]) Interventions    Readmission Risk Interventions Readmission Risk Prevention Plan 08/05/2021  Transportation Screening Complete  PCP or Specialist appointment within 3-5 days of discharge Complete  HRI or [place] Complete  SW Recovery Care/Counseling Consult Complete  [place] Not Applicable  Some recent data might be hidden

## 2021-08-07 NOTE — Progress Notes (Signed)
PROGRESS NOTE    Corey [name]  WTU:[id number] DOB: Apr 05, 1946 DOA: 07/28/2021 PCP: [name], MD    Brief Narrative:  75 year old gentleman history of non-small cell lung cancer on chemotherapy, OSA, A. fib, coronary artery disease presented with left lower extremity swelling and generalized weakness.  Patient noted to have recently been treated with Levaquin for pneumonia which she completed the day prior to his erythema.  Patient presented with shortness of breath from pneumonia.  Chest x-ray done showed unchanged left lower lobe opacity, no fevers recorded, patient on also noted with a left lower extremity cellulitis.  Patient placed on IV vancomycin cefepime and admitted for further evaluation and management.  Patient noted the night of admission 12 intractable nausea and vomiting and abdominal films and chest x-ray ordered.  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 -Was on IV vancomycin and IV Rocephin.   -IV vancomycin discontinued.   -Blood cultures with no growth to date.   -Was on IV Rocephin which has been changed to cefepime due to problem #2.   -Cellulitis has been treated with a full course of antibiotics at this time.  2.  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 -It is noted that patient had a sats of 77% on room air the evening on 07/31/2021, patient noted to have required up to 8 L high flow nasal cannula on 08/01/2021.   -BNP at 24.3.  -ABG with a pH of 7.4/PCO2 of 32/PO2 of 114/bicarb of 20.   -Patient placed on Lasix 40 mg IV every 12 hours with urine output of 2.5 L. -Some initial clinical improvement on 08/02/2021 however patient with worsening hypoxia 08/03/2021 with sats of 95% on 6 L nasal cannula laying supine, when patient moved to edge of bed sats of 83% on 6 L with some complaints of dizziness.  -Sputum gram stain and culture with rare Candida albicans, viridans Streptococcus.. -Sed rate > 140.  -Appreciate input by PCCM as well as oncology.  Recommendation for starting Bactrim prophylaxis as G6PD is normal, patient remains on high-dose steroids for Keytruda pneumonitis per oncology recommendations. -Oncology has recommended Unasyn and neuropathic coverage for total of 7 days -Continue scheduled nebs. -Continue with bipap nightly -PCCM and oncology have been following.  Pulmonary has since signed off  3.  Nausea vomiting -Patient noted to have intractable nausea vomiting the evening of 07/29/2021, morning of 07/30/2021 . -Patient placed on IV Compazine and IV Zofran with clinical improvement.  -Patient stated had a bowel movement.   -Improving clinically with no further nausea or vomiting and tolerating clear liquids. -Abdominal films  with nonobstructive bowel gas pattern, mild gaseous distention of the stomach.   -Chest x-ray with new faint patchy opacity right midlung, differential includes mild atelectasis, aspiration or pneumonia.  Stable volume loss and patchy consolidation in the mid to lower left lung compatible with posttreatment  change. -Patient currently tolerating dysphagia 3 diet, tolerating thus far -Seen by SLP and underwent mbs   4.  aspiration pneumonia/CAP versus BOOP/drug-induced pneumonitis.  -Patient noted with intractable nausea and vomiting the evening of 07/29/2021 and in the morning of 07/30/2021.  -Repeat chest x-ray done 08/01/2021 with patchy AICD in the right lung corresponds to groundglass opacities on CT, with mild interval progression.  Chronic volume loss in the left hemothorax with pleural parenchymal opacity in the left lower lung.  -MRSA PCR negative.   -Sputum gram stain and culture with rare Candida albicans, rare variant Streptococcus. -IV vancomycin discontinued.    -Due to acute respiratory failure with hypoxia and worsening chest x-ray with concerns for aspiration pneumonia antibiotics was broadened with addition of IV Flagyl to IV Rocephin and IV azithromycin.   -Patient status post course of azithromycin. -Patient with worsening ongoing hypoxia with sats in the 83% on 6 L nasal cannula with exertion as noted on PT note on 08/03/2021.   -Repeat chest x-ray with extensive bilateral pneumonia as previously seen with increasing infiltrates on the right since prior study.  -Continue Mucinex, Brovana, Flonase, Brovana, Pulmicort. -Per above, recommendation for total of 7 days of Unasyn per pulmonary recommendations at this time -PCCM following and appreciate input and recommendations. -Recommendations for continued BiPAP support per pulmonary recommendations  5.  Hypokalemia/hypomagnesemia/hyponatremia -Improving with hydration. -Potassium at 3.9, magnesium at 1.8.   -Magnesium sulfate 4 g IV x1.   -Patient on Tikosyn and as such goal is to keep potassium around 4, magnesium at 2.   6.  Stage IV non-small cell lung cancer -On palliative chemotherapy per oncology, Dr. Lorna Few. -Continue nebs/inhalers. -Patient's oncologist ordered staging CT scans on 07/31/2021.   -Oncology  informed of admission via epic. -Patient seen by oncology today 08/04/2021. -Concerned that patient may have Keytruda induced pneumonitis and as such this will not be resumed on discharge per oncology.  7.  Paroxysmal atrial fibrillation -Patient with intractable nausea vomiting unable to tolerate any oral intake early on during the hospitalization which has since resolved. -Patient transitioned from IV Lopressor to oral home dose Lopressor.  -Continue Tikosyn.   -Keep potassium approximately 4, keep magnesium approximately 2.   -Was on full dose Lovenox and transitioned back to home dose Xarelto, currently stable 8.  Hyperlipidemia -Lipitor, fenofibrate.   9.  Coronary artery disease/hypertension -IV metoprolol has been transition back to home dose oral metoprolol.  Continue statin, anticoagulation.   10.  Parkinson's -Sinemet  11.  Prolonged QT -Repeat EKG with resolution of QT prolongation currently at 467 from 530 on admission. -Magnesium currently at 1.8, potassium at 3.9.   -Magnesium sulfate 4 g IV x1. -Keep potassium approximately at 4, magnesium approximately 2.  12.  OSA -Nausea and vomiting improved no further nausea and vomiting noted.   -Was on CPAP nightly however due to respiratory issues has been placed on BiPAP.  13.  History of CVA -No overt bleeding.  -Xarelto for secondary stroke prophylaxis.   -Continued on statin.   14.  Generalized weakness -PT/OT. -PT recommending SNF however patient and family would prefer to go home with home health therapies. -Initial PT recommendations for CIR.  However, patient reports he  is unable to tolerate 3 hours of intensive therapy per day.  In fact, patient states he struggles to stand or even sit at the edge of the bed for more than a minute at a time -Transitions of care following, plan for possible LTAC  15.  Anemia -Likely secondary to chemotherapy. -Patient with no overt bleeding. -Hemoglobin at 7.0 on  08/02/2021. -Status post transfusion 2 units packed red blood cells 08/02/2021  -Patient remains hemodynamicallly stalbe   DVT prophylaxis: Xarelto Code Status: DNR Family Communication: Pt in room, family not at bedside  Status is: Inpatient  Remains inpatient appropriate because: severity of ill ness    Consultants:  PCCM Oncology  Procedures:  Abdominal x-ray 07/30/2021 Chest x-ray 07/30/2021, 08/01/2021 Bilateral lower extremity Dopplers 07/30/2021 CT chest/CT abdomen and pelvis 07/31/2021 Transfused 2 units packed red blood cells 08/02/2021  Antimicrobials: Anti-infectives (From admission, onward)    Start     Dose/Rate Route Frequency Ordered Stop   08/06/21 1215  cefTRIAXone (ROCEPHIN) 2 g in sodium chloride 0.9 % 100 mL IVPB        2 g 200 mL/hr over 30 Minutes Intravenous Every 24 hours 08/06/21 1120 08/11/21 1214   08/06/21 1215  metroNIDAZOLE (FLAGYL) IVPB 500 mg        500 mg 100 mL/hr over 60 Minutes Intravenous 2 times daily 08/06/21 1120 08/11/21 0959   08/04/21 2000  metroNIDAZOLE (FLAGYL) IVPB 500 mg  Status:  Discontinued        500 mg 100 mL/hr over 60 Minutes Intravenous Every 12 hours 08/04/21 1413 08/04/21 1621   08/04/21 1500  ceFEPIme (MAXIPIME) 2 g in sodium chloride 0.9 % 100 mL IVPB  Status:  Discontinued        2 g 200 mL/hr over 30 Minutes Intravenous Every 8 hours 08/04/21 1413 08/06/21 1058   08/03/21 1000  fluconazole (DIFLUCAN) tablet 200 mg  Status:  Discontinued        200 mg Oral Daily 08/03/21 0739 08/04/21 1347   08/02/21 2000  metroNIDAZOLE (FLAGYL) IVPB 500 mg  Status:  Discontinued        500 mg 100 mL/hr over 60 Minutes Intravenous Every 12 hours 08/02/21 1557 08/04/21 1413   08/02/21 1300  metroNIDAZOLE (FLAGYL) IVPB 500 mg  Status:  Discontinued        500 mg 100 mL/hr over 60 Minutes Intravenous Every 12 hours 08/02/21 1150 08/02/21 1557   08/02/21 1000  fluconazole (DIFLUCAN) IVPB 200 mg  Status:  Discontinued        200  mg 100 mL/hr over 60 Minutes Intravenous Every 24 hours 08/02/21 0827 08/03/21 0739   08/01/21 1000  azithromycin (ZITHROMAX) tablet 500 mg  Status:  Discontinued        500 mg Oral Daily 07/31/21 1438 08/03/21 1111   07/30/21 1000  azithromycin (ZITHROMAX) 500 mg in sodium chloride 0.9 % 250 mL IVPB  Status:  Discontinued        500 mg 250 mL/hr over 60 Minutes Intravenous Every 24 hours 07/30/21 0755 07/31/21 1438   07/30/21 0000  vancomycin (VANCOCIN) IVPB 1000 mg/200 mL premix  Status:  Discontinued        1,000 mg 200 mL/hr over 60 Minutes Intravenous Every 12 hours 07/29/21 0854 07/31/21 1436   07/29/21 1030  vancomycin (VANCOREADY) IVPB 2000 mg/400 mL        2,000 mg 200 mL/hr over 120 Minutes Intravenous  Once 07/29/21 0844 07/29/21 1447   07/29/21 1000  cefTRIAXone (  ROCEPHIN) 2 g in sodium chloride 0.9 % 100 mL IVPB  Status:  Discontinued        2 g 200 mL/hr over 30 Minutes Intravenous Every 24 hours 07/29/21 0832 08/04/21 1413   07/28/21 1600  vancomycin (VANCOCIN) IVPB 1000 mg/200 mL premix        1,000 mg 200 mL/hr over 60 Minutes Intravenous  Once 07/28/21 1545 07/28/21 1800   07/28/21 1600  ceFEPIme (MAXIPIME) 2 g in sodium chloride 0.9 % 100 mL IVPB        2 g 200 mL/hr over 30 Minutes Intravenous  Once 07/28/21 1545 07/28/21 1701   07/28/21 1400  ceFAZolin (ANCEF) IVPB 2g/100 mL premix  Status:  Discontinued        2 g 200 mL/hr over 30 Minutes Intravenous Every 8 hours 07/28/21 1334 07/28/21 1545       Subjective: States tolerating more with PT today  Objective: Vitals:   08/07/21 0323 08/07/21 0330 08/07/21 0801 08/07/21 1326  BP: 127/82   131/80  Pulse: 80   84  Resp:    18  Temp: 98 F (36.7 C)   98 F (36.7 C)  TempSrc: Oral   Oral  SpO2: 97%  97% 98%  Weight:  100.7 kg    Height:        Intake/Output Summary (Last 24 hours) at 08/07/2021 1814 Last data filed at 08/07/2021 1400 Gross per 24 hour  Intake 329.03 ml  Output 1950 ml  Net -1620.97 ml     Filed Weights   08/05/21 0507 08/06/21 0611 08/07/21 0330  Weight: 98.7 kg 105.8 kg 100.7 kg    Examination: General exam: Awake, laying in bed, in nad Respiratory system: Normal respiratory effort, no wheezing Cardiovascular system: regular rate, s1, s2 Gastrointestinal system: Soft, nondistended, positive BS Central nervous system: CN2-12 grossly intact, strength intact Extremities: Perfused, no clubbing Skin: Normal skin turgor, no notable skin lesions seen Psychiatry: Mood normal // no visual hallucinations   Data Reviewed: I have personally reviewed following labs and imaging studies  CBC: Recent Labs  Lab 08/01/21 0457 08/02/21 0321 08/03/21 0315 08/04/21 0917 08/05/21 0643 08/06/21 0453  WBC 6.4 6.3 6.2 6.3 4.5 8.1  NEUTROABS 5.4 5.3 5.3 5.1 4.1  --   HGB 7.3* 7.0* 8.9* 9.4* 9.7* 9.6*  HCT 22.2* 21.6* 26.7* 28.3* 29.9* 29.8*  MCV 96.5 96.0 94.0 96.6 98.4 98.3  PLT 140* 146* 148* 203 191 203    Basic Metabolic Panel: Recent Labs  Lab 08/01/21 0457 08/02/21 0321 08/03/21 0315 08/04/21 0917 08/05/21 0643 08/06/21 0453  NA 130* 129* 129* 129* 134* 133*  K 3.9 3.4* 3.6 3.9 4.3 4.5  CL 104 99 97* 98 102 103  CO2 21* 23 25 25 24 24   GLUCOSE 91 94 106* 107* 150* 132*  BUN 9 10 12 12 19 23   CREATININE 0.64 0.61 0.63 0.67 0.70 0.68  CALCIUM 8.0* 8.1* 8.3* 8.1* 8.8* 9.0  MG 1.7 1.8 1.7 1.8 2.3  --   PHOS  --   --   --   --  4.1  --     GFR: Estimated Creatinine Clearance: 101.9 mL/min (by C-G formula based on SCr of 0.68 mg/dL). Liver Function Tests: Recent Labs  Lab 08/05/21 0643 08/06/21 0453  AST 31 46*  ALT 13 20  ALKPHOS 61 65  BILITOT 0.7 0.5  PROT 6.8 6.6  ALBUMIN 2.1* 2.1*    No results for input(s): LIPASE, AMYLASE in the last  168 hours. No results for input(s): AMMONIA in the last 168 hours. Coagulation Profile: No results for input(s): INR, PROTIME in the last 168 hours. Cardiac Enzymes: No results for input(s): CKTOTAL, CKMB,  CKMBINDEX, TROPONINI in the last 168 hours. BNP (last 3 results) No results for input(s): PROBNP in the last 8760 hours. HbA1C: No results for input(s): HGBA1C in the last 72 hours. CBG: No results for input(s): GLUCAP in the last 168 hours. Lipid Profile: No results for input(s): CHOL, HDL, LDLCALC, TRIG, CHOLHDL, LDLDIRECT in the last 72 hours. Thyroid Function Tests: No results for input(s): TSH, T4TOTAL, FREET4, T3FREE, THYROIDAB in the last 72 hours. Anemia Panel: No results for input(s): VITAMINB12, FOLATE, FERRITIN, TIBC, IRON, RETICCTPCT in the last 72 hours. Sepsis Labs: Recent Labs  Lab 08/04/21 0917 08/05/21 0643  PROCALCITON 0.31 0.19     Recent Results (from the past 240 hour(s))  Expectorated Sputum Assessment w Gram Stain, Rflx to Resp Cult     Status: None   Collection Time: 07/30/21  7:57 AM   Specimen: Sputum  Result Value Ref Range Status   Specimen Description SPUTUM  Final   Special Requests NONE  Final   Sputum evaluation   Final    THIS SPECIMEN IS ACCEPTABLE FOR SPUTUM CULTURE Performed at [facility], [place] [address], [place], [place] [address]    Report Status 07/30/2021 FINAL  Final  Culture, Respiratory w Gram Stain     Status: None   Collection Time: 07/30/21  7:57 AM   Specimen: SPU  Result Value Ref Range Status   Specimen Description   Final    SPUTUM Performed at [place] [address] [place], [place] [address]    Special Requests   Final    NONE Reflexed from [id number] Performed at [place] [address] [place], [place] [address]    Gram Stain   Final    RARE SQUAMOUS EPITHELIAL CELLS PRESENT RARE WBC PRESENT, PREDOMINANTLY PMN RARE GRAM NEGATIVE RODS    Culture   Final    RARE CANDIDA ALBICANS RARE VIRIDANS STREPTOCOCCUS Standardized susceptibility testing for this organism is not available. Performed at [facility], [place] [address], [place],  [place] [address]    Report Status 08/01/2021 FINAL  Final  MRSA Next Gen by PCR, Nasal     Status: None   Collection Time: 07/31/21  8:12 AM   Specimen: Nasal Mucosa; Nasal Swab  Result Value Ref Range Status   MRSA by PCR Next Gen NOT DETECTED NOT DETECTED Final    Comment: (NOTE) The GeneXpert MRSA Assay (FDA approved for NASAL specimens only), is one component of a comprehensive MRSA colonization surveillance program. It is not intended to diagnose MRSA infection nor to guide or monitor treatment for MRSA infections. Test performance is not FDA approved in patients less than 46 years old. Performed at [facility], [place] [address], [place], [place] [address]       Radiology Studies: No results found.  Scheduled Meds:  acidophilus   Oral Daily   arformoterol  15 mcg Nebulization BID   atorvastatin  40 mg Oral Daily   budesonide (PULMICORT) nebulizer solution  0.5 mg Nebulization BID   carbidopa-levodopa  1 tablet Oral 3 times per day   chlorhexidine  15 mL Mouth Rinse BID   Chlorhexidine Gluconate Cloth  6 each Topical Daily   dofetilide  500 mcg Oral BID   fenofibrate  160  mg Oral Daily   fluticasone  2 spray Each Nare Daily   folic acid  1 mg Oral Daily   guaiFENesin  1,200 mg Oral BID   ipratropium  1-2 spray Each Nare Q12H   ipratropium  0.5 mg Nebulization TID   levalbuterol  0.63 mg Nebulization TID   magnesium oxide  400 mg Oral Daily   mouth rinse  15 mL Mouth Rinse q12n4p   metoprolol succinate  25 mg Oral Daily   multivitamin with minerals  1 tablet Oral Daily   pantoprazole  40 mg Oral Daily   potassium chloride  10 mEq Oral Daily   predniSONE  100 mg Oral Q breakfast   rivaroxaban  20 mg Oral Q supper   tamsulosin  0.4 mg Oral QPC supper   topiramate  100 mg Oral BID   Continuous Infusions:  cefTRIAXone (ROCEPHIN)  IV 2 g (08/07/21 1133)   metronidazole 500 mg (08/07/21 1137)     LOS: 9 days   Marylu Lund, MD [facility] Pager  On Amion  If 7PM-7AM, please contact night-coverage 08/07/2021, 6:14 PM

## 2021-08-07 NOTE — Progress Notes (Signed)
Physical Therapy Treatment Patient Details Name: Corey [name] MRN: [id number] DOB: 07-Jul-1946 Today's Date: 08/07/2021   History of Present Illness 75yo male who presented on 10/25 with L LE edema and generalized weakness. Also with recent hx of PNA. Admitted with L LE cellulitis. PMH NSCLC on chemo, Afib, CAD, chronic LBP with radiculopathy, HLD, HTN, prostate CA, CVA, ACDF, back surgery, L rotator cuff repair, TKA    PT Comments    General Comments: AxO x 3 really wants to be able but.......limited activity tolerance.   Assisted OOB to attempt amb was difficult.  General bed mobility comments: increased time and effort with HOB elevated and use of bed pad to complete scooting. General transfer comment: pt required + 2 side by side assist from elevated to rise using walker. General Gait Details: very limited gait distance/tolerance due to MAX c/o weakness/Mod c/o dizziness and increased fear/anxiety.  Recliner pulled up to pt from behind. Positioned in recliner to comfort.     Recommendations for follow up therapy are one component of a multi-disciplinary discharge planning process, led by the attending physician.  Recommendations may be updated based on patient status, additional functional criteria and insurance authorization.  Follow Up Recommendations  Long-term institutional care without follow-up therapy (per chart review)     Assistance Recommended at Discharge Frequent or constant Supervision/Assistance  Equipment Recommendations  None recommended by PT    Recommendations for Other Services       Precautions / Restrictions Precautions Precautions: Fall Precaution Comments: CHEMO Restrictions Weight Bearing Restrictions: No     Mobility  Bed Mobility Overal bed mobility: Needs Assistance Bed Mobility: Supine to Sit     Supine to sit: Max assist;HOB elevated     General bed mobility comments: increased time and effort with HOB elevated and use of bed pad to complete  scooting    Transfers Overall transfer level: Needs assistance Equipment used: Rolling walker (2 wheels) Transfers: Sit to/from [facility] Sit to Stand: Mod assist;+2 physical assistance;+2 safety/equipment;Max assist           General transfer comment: pt required + 2 side by side assist from elevated to rise using walker.    Ambulation/Gait Ambulation/Gait assistance: Max assist;+2 physical assistance;+2 safety/equipment Gait Distance (Feet): 2 Feet Assistive device: Rolling walker (2 wheels) Gait Pattern/deviations: Step-to pattern Gait velocity: decreased   General Gait Details: very limited gait distance/tolerance due to MAX c/o weakness/Mod c/o dizziness and increased fear/anxiety.  Recliner pulled up to pt from behind.   Stairs             Wheelchair Mobility    Modified Rankin (Stroke Patients Only)       Balance                                            Cognition Arousal/Alertness: Awake/alert Behavior During Therapy: WFL for tasks assessed/performed Overall Cognitive Status: Within Functional Limits for tasks assessed                                 General Comments: AxO x 3 really wants to be able but.......        Exercises      General Comments        Pertinent Vitals/Pain Pain Assessment: Faces Faces Pain Scale: Hurts a little  bit Pain Location: general Pain Descriptors / Indicators: Aching Pain Intervention(s): Monitored during session    Home Living                          Prior Function            PT Goals (current goals can now be found in the care plan section) Progress towards PT goals: Progressing toward goals    Frequency    Min 3X/week      PT Plan Current plan remains appropriate;Frequency needs to be updated    Co-evaluation              AM-PAC PT "6 Clicks" Mobility   Outcome Measure  Help needed turning from your back to your side  while in a flat bed without using bedrails?: A Lot Help needed moving from lying on your back to sitting on the side of a flat bed without using bedrails?: A Lot Help needed moving to and from a bed to a chair (including a wheelchair)?: A Lot Help needed standing up from a chair using your arms (e.g., wheelchair or bedside chair)?: A Lot Help needed to walk in hospital room?: Total Help needed climbing 3-5 steps with a railing? : Total 6 Click Score: 10    End of Session Equipment Utilized During Treatment: Gait belt Activity Tolerance: Patient limited by fatigue Patient left: in chair;with call bell/phone within reach;with chair alarm set Nurse Communication: Mobility status PT Visit Diagnosis: Unsteadiness on feet (R26.81);Muscle weakness (generalized) (M62.81);Difficulty in walking, not elsewhere classified (R26.2)     Time: 7616-0737 PT Time Calculation (min) (ACUTE ONLY): 29 min  Charges:  $Therapeutic Activity: 8-22 mins                     Rica Koyanagi  PTA Acute  Rehabilitation Services Pager      [phone] Office      [phone]

## 2021-08-07 NOTE — Progress Notes (Signed)
Inpatient Rehabilitation Admissions Coordinator   Noted LTACH to be pursued. We will sign off at this time.  Danne Baxter, RN, MSN Rehab Admissions Coordinator [phone] 08/07/2021 11:31 AM

## 2021-08-07 NOTE — Progress Notes (Signed)
OT Cancellation Note  Patient Details Name: SHAMAR ENGELMANN MRN: [id number] DOB: Nov 07, 1945   Cancelled Treatment:    Reason Eval/Treat Not Completed: Fatigue/lethargy limiting ability to participate: Pt very politely declined OT this afternoon stating that he has already been up 3 times today and is too fatigued for more activity right now. Pt also eating and visiting with his brother. Pt with questions re: therapy expectations in [facility] and questions were answered to this OT's ability. Will continue efforts.   Julien Girt 08/07/2021, 2:00 PM

## 2021-08-08 DIAGNOSIS — L03116 Cellulitis of left lower limb: Secondary | ICD-10-CM | POA: Diagnosis not present

## 2021-08-08 DIAGNOSIS — I480 Paroxysmal atrial fibrillation: Secondary | ICD-10-CM | POA: Diagnosis not present

## 2021-08-08 MED ORDER — LEVALBUTEROL HCL 0.63 MG/3ML IN NEBU
0.6300 mg | INHALATION_SOLUTION | Freq: Two times a day (BID) | RESPIRATORY_TRACT | Status: DC
  Administered 2021-08-08 – 2021-08-12 (×8): 0.63 mg via RESPIRATORY_TRACT
  Filled 2021-08-08 (×6): qty 3

## 2021-08-08 MED ORDER — IPRATROPIUM BROMIDE 0.02 % IN SOLN
0.5000 mg | Freq: Two times a day (BID) | RESPIRATORY_TRACT | Status: DC
  Administered 2021-08-08 – 2021-08-12 (×8): 0.5 mg via RESPIRATORY_TRACT
  Filled 2021-08-08 (×8): qty 2.5

## 2021-08-08 NOTE — Progress Notes (Signed)
PROGRESS NOTE    Corey [name]  VQQ:[id number] DOB: 24-Mar-1946 DOA: 07/28/2021 PCP: [name], MD    Brief Narrative:  75 year old gentleman history of non-small cell lung cancer on chemotherapy, OSA, A. fib, coronary artery disease presented with left lower extremity swelling and generalized weakness.  Patient noted to have recently been treated with Levaquin for pneumonia which she completed the day prior to his erythema.  Patient presented with shortness of breath from pneumonia.  Chest x-ray done showed unchanged left lower lobe opacity, no fevers recorded, patient on also noted with a left lower extremity cellulitis.  Patient placed on IV vancomycin cefepime and admitted for further evaluation and management.  Patient noted the night of admission 12 intractable nausea and vomiting and abdominal films and chest x-ray ordered.  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 -Was on IV vancomycin and IV Rocephin.   -IV vancomycin discontinued.   -Blood cultures with no growth to date.   -Was on IV Rocephin which has been changed to cefepime due to problem #2.   -Cellulitis has been treated with a full course of antibiotics at this time.  2.  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 -Pt needing as high as 8L high flow O2, still attemtping to wean gradually over time -Pt is documented to require 4-5L however, very rapidly deompensates on minimal exertion, currently requiring 6L high flow O2 when seen -Sputum gram stain and culture with rare Candida albicans, viridans Streptococcus.. -Sed rate > 140.  -Appreciate input by PCCM as well as oncology.  Recommendation for starting Bactrim prophylaxis as G6PD is normal, patient remains on high-dose steroids for Keytruda pneumonitis per oncology recommendations. -Oncology has recommended Unasyn and neuropathic coverage for total of 7 days -Continue scheduled nebs. -Continue with bipap nightly -PCCM and oncology have been following.  Pulmonary has since signed off  3.  Nausea vomiting -Patient noted to have intractable nausea vomiting the evening of 07/29/2021, morning of 07/30/2021 . -Patient currently tolerating dysphagia 3 diet, tolerating thus far, resolved -Seen by SLP and underwent mbs   4.  aspiration pneumonia/CAP versus BOOP/drug-induced pneumonitis.  -Patient noted with intractable nausea and vomiting the evening of 07/29/2021 and in the morning of 07/30/2021.  -Repeat chest x-ray done 08/01/2021 with patchy AICD in the right lung corresponds to groundglass opacities on CT, with mild interval progression.  Chronic volume loss in the left hemothorax with pleural parenchymal opacity in the left lower lung.  -MRSA PCR negative.   -Sputum gram stain and culture with rare Candida albicans, rare variant Streptococcus.    -Patient status post  course of azithromycin. -Patient with worsening ongoing hypoxia with sats in the 83% on 6 L nasal cannula with exertion as noted on PT note on 08/03/2021.   -Repeat chest x-ray with  extensive bilateral pneumonia as previously seen with increasing infiltrates on the right since prior study.  -Continue Mucinex, Brovana, Flonase, Brovana, Pulmicort. -Per above, recommendation for total of 7 days of Unasyn per pulmonary recommendations at this time -PCCM following and appreciate input and recommendations. -Recommendations for continued BiPAP support per pulmonary recommendations  5.  Hypokalemia/hypomagnesemia/hyponatremia -resolved -Patient on Tikosyn and as such goal is to keep potassium around 4, magnesium at 2.   6.  Stage IV non-small cell lung cancer -On palliative chemotherapy per oncology, Dr. Lorna Few. -Continue nebs/inhalers. -Patient's oncologist ordered staging CT scans on 07/31/2021.   -Patient seen by oncology -Concerned that patient may have Keytruda induced pneumonitis and as such this will not be resumed on discharge per oncology.  7.  Paroxysmal atrial fibrillation -Patient with intractable nausea vomiting unable to tolerate any oral intake early on during the hospitalization which has since resolved. -Patient transitioned from IV Lopressor to oral home dose Lopressor.  -Continue Tikosyn.   -Keep potassium approximately 4, keep magnesium approximately 2.   -Was on full dose Lovenox and transitioned back to home dose Xarelto, currently stable  8.  Hyperlipidemia -Lipitor, fenofibrate.   9.  Coronary artery disease/hypertension -IV metoprolol has been transition back to home dose oral metoprolol.  Continue statin, anticoagulation.   10.  Parkinson's -Sinemet  11.  Prolonged QT -Repeat EKG with resolution of QT prolongation currently at 467 from 530 on admission. -Magnesium currently at 1.8, potassium at 3.9.   -Magnesium sulfate 4 g IV x1. -Keep potassium approximately at 4, magnesium approximately 2.  12.  OSA -Nausea and vomiting improved no further nausea and vomiting noted.   -Was on CPAP nightly however due to respiratory issues  has been placed on BiPAP.  13.  History of CVA -No overt bleeding.  -Xarelto for secondary stroke prophylaxis.   -Continued on statin.   14.  Generalized weakness -PT/OT. -PT recommending SNF however patient and family would prefer to go home with home health therapies. -Initial PT recommendations for CIR.  However, patient reports he is unable to tolerate 3 hours of intensive therapy per day.  In fact, patient states he struggles to stand or even sit at the edge of the bed for more than a minute at a time -Transitions of care following, plan for possible LTAC noted  15.  Anemia -Likely secondary to chemotherapy. -Patient with no overt bleeding. -Hemoglobin at 7.0 on 08/02/2021. -Status post transfusion 2 units packed red blood cells 08/02/2021  -Patient remains hemodynamicallly stalbe   DVT prophylaxis: Xarelto Code Status: DNR Family Communication: Pt in room, family not at bedside  Status is: Inpatient  Remains inpatient appropriate because: severity of ill ness   Consultants:  PCCM Oncology  Procedures:  Abdominal x-ray 07/30/2021 Chest x-ray 07/30/2021, 08/01/2021 Bilateral lower extremity Dopplers 07/30/2021 CT chest/CT abdomen and pelvis 07/31/2021 Transfused 2 units packed red blood cells 08/02/2021  Antimicrobials: Anti-infectives (From admission, onward)    Start     Dose/Rate Route Frequency Ordered Stop   08/06/21 1215  cefTRIAXone (ROCEPHIN) 2 g in sodium chloride 0.9 % 100 mL IVPB        2 g 200 mL/hr over 30 Minutes Intravenous Every 24 hours 08/06/21 1120 08/11/21 1214   08/06/21 1215  metroNIDAZOLE (FLAGYL) IVPB 500 mg  500 mg 100 mL/hr over 60 Minutes Intravenous 2 times daily 08/06/21 1120 08/11/21 0959   08/04/21 2000  metroNIDAZOLE (FLAGYL) IVPB 500 mg  Status:  Discontinued        500 mg 100 mL/hr over 60 Minutes Intravenous Every 12 hours 08/04/21 1413 08/04/21 1621   08/04/21 1500  ceFEPIme (MAXIPIME) 2 g in sodium chloride 0.9 % 100  mL IVPB  Status:  Discontinued        2 g 200 mL/hr over 30 Minutes Intravenous Every 8 hours 08/04/21 1413 08/06/21 1058   08/03/21 1000  fluconazole (DIFLUCAN) tablet 200 mg  Status:  Discontinued        200 mg Oral Daily 08/03/21 0739 08/04/21 1347   08/02/21 2000  metroNIDAZOLE (FLAGYL) IVPB 500 mg  Status:  Discontinued        500 mg 100 mL/hr over 60 Minutes Intravenous Every 12 hours 08/02/21 1557 08/04/21 1413   08/02/21 1300  metroNIDAZOLE (FLAGYL) IVPB 500 mg  Status:  Discontinued        500 mg 100 mL/hr over 60 Minutes Intravenous Every 12 hours 08/02/21 1150 08/02/21 1557   08/02/21 1000  fluconazole (DIFLUCAN) IVPB 200 mg  Status:  Discontinued        200 mg 100 mL/hr over 60 Minutes Intravenous Every 24 hours 08/02/21 0827 08/03/21 0739   08/01/21 1000  azithromycin (ZITHROMAX) tablet 500 mg  Status:  Discontinued        500 mg Oral Daily 07/31/21 1438 08/03/21 1111   07/30/21 1000  azithromycin (ZITHROMAX) 500 mg in sodium chloride 0.9 % 250 mL IVPB  Status:  Discontinued        500 mg 250 mL/hr over 60 Minutes Intravenous Every 24 hours 07/30/21 0755 07/31/21 1438   07/30/21 0000  vancomycin (VANCOCIN) IVPB 1000 mg/200 mL premix  Status:  Discontinued        1,000 mg 200 mL/hr over 60 Minutes Intravenous Every 12 hours 07/29/21 0854 07/31/21 1436   07/29/21 1030  vancomycin (VANCOREADY) IVPB 2000 mg/400 mL        2,000 mg 200 mL/hr over 120 Minutes Intravenous  Once 07/29/21 0844 07/29/21 1447   07/29/21 1000  cefTRIAXone (ROCEPHIN) 2 g in sodium chloride 0.9 % 100 mL IVPB  Status:  Discontinued        2 g 200 mL/hr over 30 Minutes Intravenous Every 24 hours 07/29/21 0832 08/04/21 1413   07/28/21 1600  vancomycin (VANCOCIN) IVPB 1000 mg/200 mL premix        1,000 mg 200 mL/hr over 60 Minutes Intravenous  Once 07/28/21 1545 07/28/21 1800   07/28/21 1600  ceFEPIme (MAXIPIME) 2 g in sodium chloride 0.9 % 100 mL IVPB        2 g 200 mL/hr over 30 Minutes Intravenous  Once  07/28/21 1545 07/28/21 1701   07/28/21 1400  ceFAZolin (ANCEF) IVPB 2g/100 mL premix  Status:  Discontinued        2 g 200 mL/hr over 30 Minutes Intravenous Every 8 hours 07/28/21 1334 07/28/21 1545       Subjective: Reports increased sob on minimal exertion today  Objective: Vitals:   08/08/21 0445 08/08/21 0500 08/08/21 1229 08/08/21 1346  BP: 131/82  112/79   Pulse: 81  97   Resp: 16  20   Temp: (!) 97.4 F (36.3 C)  98.1 F (36.7 C)   TempSrc: Oral  Oral   SpO2: 97%  91% 95%  Weight:  100.9  kg    Height:        Intake/Output Summary (Last 24 hours) at 08/08/2021 1519 Last data filed at 08/08/2021 1243 Gross per 24 hour  Intake 480 ml  Output 2075 ml  Net -1595 ml    Filed Weights   08/06/21 0611 08/07/21 0330 08/08/21 0500  Weight: 105.8 kg 100.7 kg 100.9 kg    Examination: General exam: Conversant, in no acute distress Respiratory system: increased resp effort, no audible wheezing Cardiovascular system: regular rhythm, s1-s2 Gastrointestinal system: Nondistended, nontender, pos BS Central nervous system: No seizures, no tremors Extremities: No cyanosis, no joint deformities Skin: No rashes, no pallor Psychiatry: Affect normal // no auditory hallucinations   Data Reviewed: I have personally reviewed following labs and imaging studies  CBC: Recent Labs  Lab 08/02/21 0321 08/03/21 0315 08/04/21 0917 08/05/21 0643 08/06/21 0453  WBC 6.3 6.2 6.3 4.5 8.1  NEUTROABS 5.3 5.3 5.1 4.1  --   HGB 7.0* 8.9* 9.4* 9.7* 9.6*  HCT 21.6* 26.7* 28.3* 29.9* 29.8*  MCV 96.0 94.0 96.6 98.4 98.3  PLT 146* 148* 203 191 921    Basic Metabolic Panel: Recent Labs  Lab 08/02/21 0321 08/03/21 0315 08/04/21 0917 08/05/21 0643 08/06/21 0453  NA 129* 129* 129* 134* 133*  K 3.4* 3.6 3.9 4.3 4.5  CL 99 97* 98 102 103  CO2 23 25 25 24 24   GLUCOSE 94 106* 107* 150* 132*  BUN 10 12 12 19 23   CREATININE 0.61 0.63 0.67 0.70 0.68  CALCIUM 8.1* 8.3* 8.1* 8.8* 9.0  MG 1.8  1.7 1.8 2.3  --   PHOS  --   --   --  4.1  --     GFR: Estimated Creatinine Clearance: 102 mL/min (by C-G formula based on SCr of 0.68 mg/dL). Liver Function Tests: Recent Labs  Lab 08/05/21 0643 08/06/21 0453  AST 31 46*  ALT 13 20  ALKPHOS 61 65  BILITOT 0.7 0.5  PROT 6.8 6.6  ALBUMIN 2.1* 2.1*    No results for input(s): LIPASE, AMYLASE in the last 168 hours. No results for input(s): AMMONIA in the last 168 hours. Coagulation Profile: No results for input(s): INR, PROTIME in the last 168 hours. Cardiac Enzymes: No results for input(s): CKTOTAL, CKMB, CKMBINDEX, TROPONINI in the last 168 hours. BNP (last 3 results) No results for input(s): PROBNP in the last 8760 hours. HbA1C: No results for input(s): HGBA1C in the last 72 hours. CBG: No results for input(s): GLUCAP in the last 168 hours. Lipid Profile: No results for input(s): CHOL, HDL, LDLCALC, TRIG, CHOLHDL, LDLDIRECT in the last 72 hours. Thyroid Function Tests: No results for input(s): TSH, T4TOTAL, FREET4, T3FREE, THYROIDAB in the last 72 hours. Anemia Panel: No results for input(s): VITAMINB12, FOLATE, FERRITIN, TIBC, IRON, RETICCTPCT in the last 72 hours. Sepsis Labs: Recent Labs  Lab 08/04/21 0917 08/05/21 0643  PROCALCITON 0.31 0.19     Recent Results (from the past 240 hour(s))  Expectorated Sputum Assessment w Gram Stain, Rflx to Resp Cult     Status: None   Collection Time: 07/30/21  7:57 AM   Specimen: Sputum  Result Value Ref Range Status   Specimen Description SPUTUM  Final   Special Requests NONE  Final   Sputum evaluation   Final    THIS SPECIMEN IS ACCEPTABLE FOR SPUTUM CULTURE Performed at [facility], [place] [address] [place], [place] [address]    Report Status 07/30/2021 FINAL  Final  Culture, Respiratory  w Gram Stain     Status: None   Collection Time: 07/30/21  7:57 AM   Specimen: SPU  Result Value Ref Range Status   Specimen Description   Final     SPUTUM Performed at [place] [address], [place], [place] [address]    Special Requests   Final    NONE Reflexed from [id number] Performed at [facility] [address] [place], [place] [address]    Gram Stain   Final    RARE SQUAMOUS EPITHELIAL CELLS PRESENT RARE WBC PRESENT, PREDOMINANTLY PMN RARE GRAM NEGATIVE RODS    Culture   Final    RARE CANDIDA ALBICANS RARE VIRIDANS STREPTOCOCCUS Standardized susceptibility testing for this organism is not available. Performed at [facility], [place] [address], [place], [place] [address]    Report Status 08/01/2021 FINAL  Final  MRSA Next Gen by PCR, Nasal     Status: None   Collection Time: 07/31/21  8:12 AM   Specimen: Nasal Mucosa; Nasal Swab  Result Value Ref Range Status   MRSA by PCR Next Gen NOT DETECTED NOT DETECTED Final    Comment: (NOTE) The GeneXpert MRSA Assay (FDA approved for NASAL specimens only), is one component of a comprehensive MRSA colonization surveillance program. It is not intended to diagnose MRSA infection nor to guide or monitor treatment for MRSA infections. Test performance is not FDA approved in patients less than 26 years old. Performed at [facility], [place] [address], [place], [place] [address]       Radiology Studies: No results found.  Scheduled Meds:  acidophilus   Oral Daily   arformoterol  15 mcg Nebulization BID   atorvastatin  40 mg Oral Daily   budesonide (PULMICORT) nebulizer solution  0.5 mg Nebulization BID   carbidopa-levodopa  1 tablet Oral 3 times per day   chlorhexidine  15 mL Mouth Rinse BID   Chlorhexidine Gluconate Cloth  6 each Topical Daily   dofetilide  500 mcg Oral BID   fenofibrate  160 mg Oral Daily   fluticasone  2 spray Each Nare Daily   folic acid  1 mg Oral Daily   guaiFENesin  1,200 mg Oral BID   ipratropium  1-2 spray Each Nare Q12H   ipratropium  0.5 mg Nebulization BID   levalbuterol  0.63  mg Nebulization BID   magnesium oxide  400 mg Oral Daily   mouth rinse  15 mL Mouth Rinse q12n4p   metoprolol succinate  25 mg Oral Daily   multivitamin with minerals  1 tablet Oral Daily   pantoprazole  40 mg Oral Daily   potassium chloride  10 mEq Oral Daily   predniSONE  100 mg Oral Q breakfast   rivaroxaban  20 mg Oral Q supper   tamsulosin  0.4 mg Oral QPC supper   topiramate  100 mg Oral BID   Continuous Infusions:  cefTRIAXone (ROCEPHIN)  IV 2 g (08/08/21 1159)   metronidazole 500 mg (08/08/21 1039)     LOS: 10 days   Marylu Lund, MD [facility] Pager On Amion  If 7PM-7AM, please contact night-coverage 08/08/2021, 3:19 PM

## 2021-08-09 DIAGNOSIS — L03116 Cellulitis of left lower limb: Secondary | ICD-10-CM | POA: Diagnosis not present

## 2021-08-09 DIAGNOSIS — I10 Essential (primary) hypertension: Secondary | ICD-10-CM | POA: Diagnosis not present

## 2021-08-09 LAB — COMPREHENSIVE METABOLIC PANEL
ALT: 23 U/L (ref 0–44)
AST: 29 U/L (ref 15–41)
Albumin: 2.2 g/dL — ABNORMAL LOW (ref 3.5–5.0)
Alkaline Phosphatase: 60 U/L (ref 38–126)
Anion gap: 8 (ref 5–15)
BUN: 20 mg/dL (ref 8–23)
CO2: 28 mmol/L (ref 22–32)
Calcium: 9 mg/dL (ref 8.9–10.3)
Chloride: 101 mmol/L (ref 98–111)
Creatinine, Ser: 0.53 mg/dL — ABNORMAL LOW (ref 0.61–1.24)
GFR, Estimated: >60 mL/min (ref >60)
Glucose, Bld: 93 mg/dL (ref 70–99)
Potassium: 4.1 mmol/L (ref 3.5–5.1)
Sodium: 137 mmol/L (ref 135–145)
Total Bilirubin: 0.5 mg/dL (ref 0.3–1.2)
Total Protein: 6.5 g/dL (ref 6.5–8.1)

## 2021-08-09 LAB — CBC
HCT: 33.3 % — ABNORMAL LOW (ref 39.0–52.0)
Hemoglobin: 10.4 g/dL — ABNORMAL LOW (ref 13.0–17.0)
MCH: 31.4 pg (ref 26.0–34.0)
MCHC: 31.2 g/dL (ref 30.0–36.0)
MCV: 100.6 fL — ABNORMAL HIGH (ref 80.0–100.0)
Platelets: 288 10*3/uL (ref 150–400)
RBC: 3.31 MIL/uL — ABNORMAL LOW (ref 4.22–5.81)
RDW: 19 % — ABNORMAL HIGH (ref 11.5–15.5)
WBC: 8 10*3/uL (ref 4.0–10.5)
nRBC: 0.2 % (ref 0.0–0.2)

## 2021-08-09 LAB — GLUCOSE 6 PHOSPHATE DEHYDROGENASE
G6PDH: 9.4 U/g{Hb} (ref 4.8–15.7)
Hemoglobin: 9.7 g/dL — ABNORMAL LOW (ref 13.0–17.7)

## 2021-08-09 NOTE — Progress Notes (Signed)
PROGRESS NOTE    Corey [name]  HFW:[id number] DOB: 1946/02/08 DOA: 07/28/2021 PCP: [name], MD    Brief Narrative:  75 year old gentleman history of non-small cell lung cancer on chemotherapy, OSA, A. fib, coronary artery disease presented with left lower extremity swelling and generalized weakness.  Patient noted to have recently been treated with Levaquin for pneumonia which she completed the day prior to his erythema.  Patient presented with shortness of breath from pneumonia.  Chest x-ray done showed unchanged left lower lobe opacity, no fevers recorded, patient on also noted with a left lower extremity cellulitis.  Patient placed on IV vancomycin cefepime and admitted for further evaluation and management.  Patient noted the night of admission 12 intractable nausea and vomiting and abdominal films and chest x-ray ordered.  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 -Was on IV vancomycin and IV Rocephin.   -IV vancomycin discontinued.   -Blood cultures with no growth to date.   -Cellulitis has been treated with a full course of antibiotics at this time.  2.  Acute respiratory failure with hypoxia likely secondary to aspiration pneumonia versus drug-induced pneumonitis  (Keytruda) versus BOOP -Patient noted to have worsening respiratory status with increased O2 requirements the evening of 07/31/2021. -Pt needing as high as 8L high flow O2, still attemtping to wean gradually over time -Pt is documented to require 4-5L however, very rapidly deompensates on minimal exertion, currently requiring 6L high flow O2 when seen -Sputum gram stain and culture with rare Candida albicans, viridans Streptococcus.. -Sed rate > 140.  -Appreciate input by PCCM as well as oncology.  Recommendation for starting Bactrim prophylaxis as G6PD is normal, patient remains on high-dose steroids for Keytruda pneumonitis per oncology recommendations. -Pulmonary has recommended Unasyn and neuropathic coverage for total of 7 days -Continue scheduled nebs. -Continue with bipap nightly -PCCM and oncology have been following.  Pulmonary has since signed off  3.  Nausea vomiting -Patient noted to have intractable nausea vomiting the evening of 07/29/2021, morning of 07/30/2021 . -Patient currently tolerating dysphagia 3 diet, tolerating thus far, resolved -Seen by SLP and underwent mbs   4.  aspiration pneumonia/CAP versus BOOP/drug-induced pneumonitis.  -Patient noted with intractable nausea and vomiting the evening of 07/29/2021 and in the morning of 07/30/2021.  -Repeat chest x-ray done 08/01/2021 with patchy AICD in the right lung corresponds to groundglass opacities on CT, with mild interval progression.  Chronic volume loss in the left hemothorax with pleural parenchymal opacity in the left lower lung.  -MRSA PCR negative.   -Sputum gram stain and culture with rare Candida albicans, rare variant Streptococcus.    -Patient status post course of azithromycin. -Patient with worsening ongoing hypoxia with sats in the 83% on 6 L  nasal cannula with exertion as noted on PT note on 08/03/2021.   -Repeat chest x-ray with extensive bilateral pneumonia as previously seen with increasing infiltrates  on the right since prior study.  -Continue Mucinex, Brovana, Flonase, Brovana, Pulmicort. -Per above, recommendation for total of 7 days of Unasyn per pulmonary recommendations at this time -PCCM following and appreciate input and recommendations. -Recommendations for continued BiPAP as recommended by Pulmonary  5.  Hypokalemia/hypomagnesemia/hyponatremia -resolved -Patient on Tikosyn and as such goal is to keep potassium around 4, magnesium at 2.   6.  Stage IV non-small cell lung cancer -On palliative chemotherapy per oncology, Dr. Lorna Few. -Continue nebs/inhalers. -Patient's oncologist ordered staging CT scans on 07/31/2021.   -Patient seen by oncology -Concerned that patient may have Keytruda induced pneumonitis and as such this will not be resumed on discharge per oncology. On steroids per Oncology  7.  Paroxysmal atrial fibrillation -Patient with intractable nausea vomiting unable to tolerate any oral intake early on during the hospitalization which has since resolved. -Patient transitioned from IV Lopressor to oral home dose Lopressor.  -Continue Tikosyn.   -Keep potassium approximately 4, keep magnesium approximately 2.   -Was on full dose Lovenox and transitioned back to home dose Xarelto, currently stable -No evidence of acute blood loss  8.  Hyperlipidemia -Lipitor, fenofibrate.   9.  Coronary artery disease/hypertension -IV metoprolol has been transition back to home dose oral metoprolol.  Continue statin, anticoagulation.   10.  Parkinson's -Sinemet  11.  Prolonged QT -Repeat EKG with resolution of QT prolongation currently at 467 from 530 on admission. -Magnesium currently at 1.8, potassium at 3.9.   -Magnesium sulfate 4 g IV x1. -Keep potassium approximately at 4, magnesium approximately 2.  12.  OSA -Nausea and vomiting improved no further nausea and vomiting noted.   -Was on CPAP nightly however due to respiratory issues has been placed on  BiPAP.  13.  History of CVA -No overt bleeding.  -Xarelto for secondary stroke prophylaxis.   -Continued on statin.   14.  Generalized weakness -PT/OT. -PT recommending SNF however patient and family would prefer to go home with home health therapies. -Initial PT recommendations for CIR.  However, patient reports he is unable to tolerate 3 hours of intensive therapy per day.  In fact, patient states he struggles to stand or even sit at the edge of the bed for more than a minute at a time -Transitions of care following, initial plans for possible LTAC  15.  Anemia -Likely secondary to chemotherapy. -Patient with no overt bleeding. -Hemoglobin at 7.0 on 08/02/2021. -Status post transfusion 2 units packed red blood cells 08/02/2021  -Remains hemodynamicallly stalbe   DVT prophylaxis: Xarelto Code Status: DNR Family Communication: Pt in room, family not at bedside  Status is: Inpatient  Remains inpatient appropriate because: severity of ill ness   Consultants:  PCCM Oncology  Procedures:  Abdominal x-ray 07/30/2021 Chest x-ray 07/30/2021, 08/01/2021 Bilateral lower extremity Dopplers 07/30/2021 CT chest/CT abdomen and pelvis 07/31/2021 Transfused 2 units packed red blood cells 08/02/2021  Antimicrobials: Anti-infectives (From admission, onward)    Start     Dose/Rate Route Frequency Ordered Stop   08/06/21 1215  cefTRIAXone (ROCEPHIN) 2 g in sodium chloride 0.9 % 100 mL IVPB        2 g 200 mL/hr over 30 Minutes Intravenous Every 24 hours 08/06/21 1120 08/11/21 1214   08/06/21 1215  metroNIDAZOLE (FLAGYL) IVPB 500 mg        500  mg 100 mL/hr over 60 Minutes Intravenous 2 times daily 08/06/21 1120 08/11/21 0959   08/04/21 2000  metroNIDAZOLE (FLAGYL) IVPB 500 mg  Status:  Discontinued        500 mg 100 mL/hr over 60 Minutes Intravenous Every 12 hours 08/04/21 1413 08/04/21 1621   08/04/21 1500  ceFEPIme (MAXIPIME) 2 g in sodium chloride 0.9 % 100 mL IVPB  Status:   Discontinued        2 g 200 mL/hr over 30 Minutes Intravenous Every 8 hours 08/04/21 1413 08/06/21 1058   08/03/21 1000  fluconazole (DIFLUCAN) tablet 200 mg  Status:  Discontinued        200 mg Oral Daily 08/03/21 0739 08/04/21 1347   08/02/21 2000  metroNIDAZOLE (FLAGYL) IVPB 500 mg  Status:  Discontinued        500 mg 100 mL/hr over 60 Minutes Intravenous Every 12 hours 08/02/21 1557 08/04/21 1413   08/02/21 1300  metroNIDAZOLE (FLAGYL) IVPB 500 mg  Status:  Discontinued        500 mg 100 mL/hr over 60 Minutes Intravenous Every 12 hours 08/02/21 1150 08/02/21 1557   08/02/21 1000  fluconazole (DIFLUCAN) IVPB 200 mg  Status:  Discontinued        200 mg 100 mL/hr over 60 Minutes Intravenous Every 24 hours 08/02/21 0827 08/03/21 0739   08/01/21 1000  azithromycin (ZITHROMAX) tablet 500 mg  Status:  Discontinued        500 mg Oral Daily 07/31/21 1438 08/03/21 1111   07/30/21 1000  azithromycin (ZITHROMAX) 500 mg in sodium chloride 0.9 % 250 mL IVPB  Status:  Discontinued        500 mg 250 mL/hr over 60 Minutes Intravenous Every 24 hours 07/30/21 0755 07/31/21 1438   07/30/21 0000  vancomycin (VANCOCIN) IVPB 1000 mg/200 mL premix  Status:  Discontinued        1,000 mg 200 mL/hr over 60 Minutes Intravenous Every 12 hours 07/29/21 0854 07/31/21 1436   07/29/21 1030  vancomycin (VANCOREADY) IVPB 2000 mg/400 mL        2,000 mg 200 mL/hr over 120 Minutes Intravenous  Once 07/29/21 0844 07/29/21 1447   07/29/21 1000  cefTRIAXone (ROCEPHIN) 2 g in sodium chloride 0.9 % 100 mL IVPB  Status:  Discontinued        2 g 200 mL/hr over 30 Minutes Intravenous Every 24 hours 07/29/21 0832 08/04/21 1413   07/28/21 1600  vancomycin (VANCOCIN) IVPB 1000 mg/200 mL premix        1,000 mg 200 mL/hr over 60 Minutes Intravenous  Once 07/28/21 1545 07/28/21 1800   07/28/21 1600  ceFEPIme (MAXIPIME) 2 g in sodium chloride 0.9 % 100 mL IVPB        2 g 200 mL/hr over 30 Minutes Intravenous  Once 07/28/21 1545  07/28/21 1701   07/28/21 1400  ceFAZolin (ANCEF) IVPB 2g/100 mL premix  Status:  Discontinued        2 g 200 mL/hr over 30 Minutes Intravenous Every 8 hours 07/28/21 1334 07/28/21 1545       Subjective: Denies chest pain or sob currently, seen with pt sitting in char. Reports increased sob on minimal exertion briefly needing increased O2 requirements  Objective: Vitals:   08/08/21 2227 08/09/21 0603 08/09/21 0745 08/09/21 1500  BP:  128/76  118/67  Pulse:  82  84  Resp: 20 16  20   Temp:  97.9 F (36.6 C)  97.8 F (36.6 C)  TempSrc:  Oral  Oral  SpO2:  94% 93% 93%  Weight:      Height:        Intake/Output Summary (Last 24 hours) at 08/09/2021 1558 Last data filed at 08/09/2021 0600 Gross per 24 hour  Intake 520 ml  Output 2150 ml  Net -1630 ml    Filed Weights   08/06/21 0611 08/07/21 0330 08/08/21 0500  Weight: 105.8 kg 100.7 kg 100.9 kg    Examination: General exam: Awake, laying in bed, in nad Respiratory system: Normal respiratory effort, no wheezing Cardiovascular system: regular rate, s1, s2 Gastrointestinal system: Soft, nondistended, positive BS Central nervous system: CN2-12 grossly intact, strength intact Extremities: Perfused, no clubbing Skin: Normal skin turgor, no notable skin lesions seen Psychiatry: Mood normal // no visual hallucinations   Data Reviewed: I have personally reviewed following labs and imaging studies  CBC: Recent Labs  Lab 08/03/21 0315 08/04/21 0917 08/05/21 0643 08/06/21 0453 08/09/21 0539  WBC 6.2 6.3 4.5 8.1 8.0  NEUTROABS 5.3 5.1 4.1  --   --   HGB 8.9* 9.4* 9.7* 9.6* 10.4*  HCT 26.7* 28.3* 29.9* 29.8* 33.3*  MCV 94.0 96.6 98.4 98.3 100.6*  PLT 148* 203 191 247 630    Basic Metabolic Panel: Recent Labs  Lab 08/03/21 0315 08/04/21 0917 08/05/21 0643 08/06/21 0453 08/09/21 0539  NA 129* 129* 134* 133* 137  K 3.6 3.9 4.3 4.5 4.1  CL 97* 98 102 103 101  CO2 25 25 24 24 28   GLUCOSE 106* 107* 150* 132* 93   BUN 12 12 19 23 20   CREATININE 0.63 0.67 0.70 0.68 0.53*  CALCIUM 8.3* 8.1* 8.8* 9.0 9.0  MG 1.7 1.8 2.3  --   --   PHOS  --   --  4.1  --   --     GFR: Estimated Creatinine Clearance: 102 mL/min (A) (by C-G formula based on SCr of 0.53 mg/dL (L)). Liver Function Tests: Recent Labs  Lab 08/05/21 0643 08/06/21 0453 08/09/21 0539  AST 31 46* 29  ALT 13 20 23   ALKPHOS 61 65 60  BILITOT 0.7 0.5 0.5  PROT 6.8 6.6 6.5  ALBUMIN 2.1* 2.1* 2.2*    No results for input(s): LIPASE, AMYLASE in the last 168 hours. No results for input(s): AMMONIA in the last 168 hours. Coagulation Profile: No results for input(s): INR, PROTIME in the last 168 hours. Cardiac Enzymes: No results for input(s): CKTOTAL, CKMB, CKMBINDEX, TROPONINI in the last 168 hours. BNP (last 3 results) No results for input(s): PROBNP in the last 8760 hours. HbA1C: No results for input(s): HGBA1C in the last 72 hours. CBG: No results for input(s): GLUCAP in the last 168 hours. Lipid Profile: No results for input(s): CHOL, HDL, LDLCALC, TRIG, CHOLHDL, LDLDIRECT in the last 72 hours. Thyroid Function Tests: No results for input(s): TSH, T4TOTAL, FREET4, T3FREE, THYROIDAB in the last 72 hours. Anemia Panel: No results for input(s): VITAMINB12, FOLATE, FERRITIN, TIBC, IRON, RETICCTPCT in the last 72 hours. Sepsis Labs: Recent Labs  Lab 08/04/21 0917 08/05/21 0643  PROCALCITON 0.31 0.19     Recent Results (from the past 240 hour(s))  MRSA Next Gen by PCR, Nasal     Status: None   Collection Time: 07/31/21  8:12 AM   Specimen: Nasal Mucosa; Nasal Swab  Result Value Ref Range Status   MRSA by PCR Next Gen NOT DETECTED NOT DETECTED Final    Comment: (NOTE) The GeneXpert MRSA Assay (FDA approved for NASAL specimens only),  is one component of a comprehensive MRSA colonization surveillance program. It is not intended to diagnose MRSA infection nor to guide or monitor treatment for MRSA infections. Test  performance is not FDA approved in patients less than 40 years old. Performed at [facility], [place] [address], [place], [place] [address]       Radiology Studies: No results found.  Scheduled Meds:  acidophilus   Oral Daily   arformoterol  15 mcg Nebulization BID   atorvastatin  40 mg Oral Daily   budesonide (PULMICORT) nebulizer solution  0.5 mg Nebulization BID   carbidopa-levodopa  1 tablet Oral 3 times per day   chlorhexidine  15 mL Mouth Rinse BID   Chlorhexidine Gluconate Cloth  6 each Topical Daily   dofetilide  500 mcg Oral BID   fenofibrate  160 mg Oral Daily   fluticasone  2 spray Each Nare Daily   folic acid  1 mg Oral Daily   guaiFENesin  1,200 mg Oral BID   ipratropium  1-2 spray Each Nare Q12H   ipratropium  0.5 mg Nebulization BID   levalbuterol  0.63 mg Nebulization BID   magnesium oxide  400 mg Oral Daily   mouth rinse  15 mL Mouth Rinse q12n4p   metoprolol succinate  25 mg Oral Daily   multivitamin with minerals  1 tablet Oral Daily   pantoprazole  40 mg Oral Daily   potassium chloride  10 mEq Oral Daily   predniSONE  100 mg Oral Q breakfast   rivaroxaban  20 mg Oral Q supper   tamsulosin  0.4 mg Oral QPC supper   topiramate  100 mg Oral BID   Continuous Infusions:  cefTRIAXone (ROCEPHIN)  IV 2 g (08/09/21 1212)   metronidazole 500 mg (08/09/21 0840)     LOS: 11 days   Marylu Lund, MD [facility] Pager On Amion  If 7PM-7AM, please contact night-coverage 08/09/2021, 3:58 PM

## 2021-08-09 NOTE — TOC Progression Note (Addendum)
Transition of Care [facility]) - Progression Note    Patient Details  Name: Corey [name] MRN: [id number] Date of Birth: [birth year]  Transition [facility] CM/SW Contact  Ross Ludwig, [place] Phone Number: 08/09/2021, 5:06 PM  Clinical Narrative:     Per physician patient may not need LTACH anymore due to patient's progress.  CSW to begin bed search for SNF placement and ask CIR to review patient again.     Expected Discharge Plan: Long Term Acute Care (LTAC) Barriers to Discharge: Insurance Authorization  Expected Discharge Plan and Services Expected Discharge Plan: [place] (LTAC)   Discharge Planning Services: CM Consult   Living arrangements for the past 2 months: Single Family Home                           HH Arranged: PT, OT, Social Work [employer] Agency: [place] Date [place]: 07/31/21 Time [place]: 1412 Representative spoke with at [place]: Cherl rose   Social Determinants of Health ([place]) Interventions    Readmission Risk Interventions Readmission Risk Prevention Plan 08/05/2021  Transportation Screening Complete  PCP or Specialist appointment within 3-5 days of discharge Complete  HRI or [place] Complete  SW Recovery Care/Counseling Consult Complete  [place] Not Applicable  Some recent data might be hidden

## 2021-08-09 NOTE — Progress Notes (Signed)
Occupational Therapy Treatment Patient Details Name: Corey [name] MRN: [id number] DOB: Jan 15, 1946 Today's Date: 08/09/2021   History of present illness 75yo male who presented on 10/25 with L LE edema and generalized weakness. Also with recent hx of PNA. Admitted with L LE cellulitis. PMH NSCLC on chemo, Afib, CAD, chronic LBP with radiculopathy, HLD, HTN, prostate CA, CVA, ACDF, back surgery, L rotator cuff repair, TKA   OT comments  Pt performed sit to stand with min A +2 and RW from recliner as preparatory activity for ADLs in standing. Pt stood for 15 seconds before fatiguing and needing seated rest break. SpO2 97% at rest on 3L, dropping to 91% with standing. After seated rest break, performing BLE knee marching in sitting 10x each. SpO2 dropping to 88%, returning to 91% after ~4 minutes. Pt is very pleasant and motivated to participate despite continued decreased cardiopulmonary status and activity tolerance. Updating recommendation to long-term acute care hospital.   Recommendations for follow up therapy are one component of a multi-disciplinary discharge planning process, led by the attending physician.  Recommendations may be updated based on patient status, additional functional criteria and insurance authorization.    Follow Up Recommendations  OT at Long-term acute care hospital    Assistance Recommended at Discharge Frequent or constant Supervision/Assistance  Equipment Recommendations  BSC    Recommendations for Other Services PT consult    Precautions / Restrictions Precautions Precautions: Fall Precaution Comments: CHEMO Restrictions Weight Bearing Restrictions: No       Mobility Bed Mobility               General bed mobility comments: Pt OOB upon arrival in recliner    Transfers Overall transfer level: Needs assistance Equipment used: Rolling walker (2 wheels) Transfers: Sit to/from Stand Sit to Stand: Min assist;+2 safety/equipment            General transfer comment: +2 side by side for power up, holding RW, and guiding into sitting     Balance Overall balance assessment: Needs assistance         Standing balance support: Bilateral upper extremity supported;Reliant on assistive device for balance Standing balance-Leahy Scale: Poor Standing balance comment: Requires BUE support on RW                           ADL either performed or assessed with clinical judgement   ADL Overall ADL's : Needs assistance/impaired                                     Functional mobility during ADLs: +2 for safety/equipment;Rolling walker (2 wheels);Minimal assistance       Vision       Perception     Praxis      Cognition Arousal/Alertness: Awake/alert Behavior During Therapy: WFL for tasks assessed/performed Overall Cognitive Status: Within Functional Limits for tasks assessed                                 General Comments: very motivated and pleasant          Exercises General Exercises - Lower Extremity Hip Flexion/Marching: AROM;Both;10 reps;Seated   Shoulder Instructions       General Comments      Pertinent Vitals/ Pain       Pain Assessment: Faces Faces Pain  Scale: Hurts little more Pain Location: chest when coughing, reports it is normal Pain Descriptors / Indicators: Grimacing Pain Intervention(s): Limited activity within patient's tolerance;Monitored during session  Home Living                                          Prior Functioning/Environment              Frequency  Min 2X/week        Progress Toward Goals  OT Goals(current goals can now be found in the care plan section)  Progress towards OT goals: Progressing toward goals  Acute Rehab OT Goals Patient Stated Goal: Regain energy and independence OT Goal Formulation: With patient Time For Goal Achievement: 08/14/21 Potential to Achieve Goals: Good ADL Goals Pt Will  Perform Grooming: with modified independence;standing Pt Will Perform Lower Body Dressing: with set-up;with adaptive equipment;sitting/lateral leans;sit to/from stand Pt Will Transfer to Toilet: with modified independence;ambulating Pt Will Perform Toileting - Clothing Manipulation and hygiene: with modified independence;with adaptive equipment Additional ADL Goal #1: Patient will tolerate BUE home exercise program 10-15 reps within pain-free ranges, in an unsupported seated position, in order to improve upper body strength, endurance and core stability needed to complete home ADLs. Additional ADL Goal #2: Patient will identify at least 3 energy conservation strategies to employ at home in order to maximize function and quality of life and decrease caregiver burden while preventing exacerbation of symptoms and rehospitalization.  Plan Discharge plan remains appropriate    Co-evaluation                 AM-PAC OT "6 Clicks" Daily Activity     Outcome Measure   Help from another person eating meals?: None Help from another person taking care of personal grooming?: A Little Help from another person toileting, which includes using toliet, bedpan, or urinal?: A Lot Help from another person bathing (including washing, rinsing, drying)?: A Lot Help from another person to put on and taking off regular upper body clothing?: A Little Help from another person to put on and taking off regular lower body clothing?: A Lot 6 Click Score: 16    End of Session Equipment Utilized During Treatment: Gait belt;Rolling walker (2 wheels)  OT Visit Diagnosis: Unsteadiness on feet (R26.81)   Activity Tolerance Patient tolerated treatment well   Patient Left in chair;with call bell/phone within reach;with family/visitor present;with nursing/sitter in room   Nurse Communication Other (comment) (nurse cleared pt to participate)        Time: 0488-8916 OT Time Calculation (min): 25 min  Charges: OT  General Charges $OT Visit: 1 Visit OT Treatments $Therapeutic Exercise: 23-37 mins  Jackquline Denmark, OTS Acute Rehab Office: [phone]   Corey [name] 08/09/2021, 12:31 PM

## 2021-08-09 NOTE — NC FL2 (Signed)
[place] LEVEL OF CARE SCREENING TOOL     IDENTIFICATION  Patient Name: Corey [name] Birthdate: [birth year] Sex: male Admission Date (Current Location): 07/28/2021  [facility] and [place] Number:  Herbalist and Address:  [facility],  [place], [place]      Provider Number: [id number]  Attending Physician Name and Address:  Donne Hazel, MD  Relative Name and Phone Number:  Askari, Kinley [phone]  [place] Son [phone]  Brea Daughter [phone]  [phone]    Current Level of Care: Hospital Recommended Level of Care: [place] Prior Approval Number:    Date Approved/Denied:   PASRR Number: [id number]  Discharge Plan: SNF    Current Diagnoses: Patient Active Problem List   Diagnosis Date Noted   Acute respiratory failure with hypoxia ([place]) 08/01/2021   Cellulitis of left leg 07/30/2021   Hypokalemia 07/30/2021   Hypomagnesemia 07/30/2021   Nausea & vomiting 07/30/2021   Cellulitis of left lower extremity    Malaise 07/28/2021   Symptomatic anemia 05/14/2021   Port-A-Cath in place 04/27/2021   Genetic testing 63/84/6659   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   COPD with acute exacerbation ([place]) 04/02/2021   Cough with hemoptysis 04/01/2021   CAP (community acquired pneumonia) 04/01/2021   Family history of prostate cancer 03/20/2021   Family history of breast cancer 03/20/2021   Family history of lung cancer 03/20/2021   Goals of care, counseling/discussion 03/18/2021   Encounter for antineoplastic chemotherapy 03/18/2021   Encounter for antineoplastic immunotherapy 03/18/2021   Primary cancer of left lower lobe of lung ([place]) 03/07/2021   Non-small cell carcinoma of left lung, stage 4 ([place]) 03/05/2021   Malnutrition of moderate degree 02/25/2021   Pleural effusion on left 02/24/2021   Hilar mass    Reactive airway  disease 11/20/2020   Allergic rhinitis with non-allergic component 09/18/2020   Heartburn 09/18/2020   Coughing 09/18/2020   Malignant neoplasm of prostate ([place]) 03/04/2020   Pre-syncope 02/27/2020   Cerebral embolism with transient ischemic attack (TIA) 02/11/2020   History of stroke 93/57/0177   Embolic stroke involving cerebral artery ([place]) 02/11/2020   Secondary hypercoagulable state ([place]) 09/10/2019   Essential tremor 06/25/2019   Chest pain 05/27/2017   Demand ischemia ([place]) 05/27/2017   Morbid obesity ([place]) 05/27/2017   Carotid artery disease ([place]) 08/30/2016   Essential hypertension 08/30/2016   Diverticulitis of intestine without perforation or abscess without bleeding    Hyponatremia 06/04/2016   Diverticulitis 05/31/2016   Iron deficiency 01/05/2016   Lumbar radiculopathy, chronic 02/04/2015   SVT (supraventricular tachycardia) ([place]) 12/03/2014   PAC (premature atrial contraction) 04/05/2014   OSA on CPAP 03/27/2014   AKI (acute kidney injury) ([place]) 12/05/2013   Nausea 12/05/2013   Sepsis ([place]) 12/05/2013   Abdominal pain 12/05/2013   AF (paroxysmal atrial fibrillation) ([place]) 01/05/2013   CVA (cerebral infarction) 07/06/2012   Coronary artery disease    Benign essential HTN    Dyslipidemia    GERD (gastroesophageal reflux disease)     Orientation RESPIRATION BLADDER Height & Weight     Self, Time, Situation, Place  O2 (3L) Continent Weight: 222 lb 7.1 oz (100.9 kg) Height:  6' 1.5" (186.7 cm)  BEHAVIORAL SYMPTOMS/MOOD NEUROLOGICAL BOWEL NUTRITION STATUS      Continent Diet (Dysphagia 3)  AMBULATORY STATUS COMMUNICATION OF NEEDS Skin   Limited Assist Verbally Normal  Personal Care Assistance Level of Assistance  Bathing, Feeding, Dressing Bathing Assistance: Limited assistance Feeding assistance: Independent Dressing Assistance: Limited assistance     Functional Limitations Info  Sight, Hearing, Speech Sight Info:  Adequate Hearing Info: Adequate Speech Info: Adequate    SPECIAL CARE FACTORS FREQUENCY  PT (By licensed PT), OT (By licensed OT)     PT Frequency: Minimum 5x a week OT Frequency: Minimum 5x a week            Contractures Contractures Info: Not present    Additional Factors Info  Code Status, Allergies Code Status Info: DNR Allergies Info: Amoxicillin   Fosinopril   Hydromorphone   Ampicillin   Monopril (Fosinopril Sodium)   Other   Rosuvastatin           Current Medications (08/09/2021):  This is the current hospital active medication list Current Facility-Administered Medications  Medication Dose Route Frequency Provider Last Rate Last Admin   acetaminophen (TYLENOL) tablet 650 mg  650 mg Oral Q6H PRN Marylyn Ishihara, Tyrone A, DO   650 mg at 07/29/21 0160   Or   acetaminophen (TYLENOL) suppository 650 mg  650 mg Rectal Q6H PRN Marylyn Ishihara, Tyrone A, DO       acidophilus (RISAQUAD) capsule   Oral Daily Kyle, Tyrone A, DO   1 capsule at 08/09/21 0834   albuterol (PROVENTIL) (2.5 MG/3ML) 0.083% nebulizer solution 3 mL  3 mL Inhalation Q4H PRN Marylyn Ishihara, Tyrone A, DO       ALPRAZolam Duanne Moron) tablet 0.5 mg  0.5 mg Oral QHS PRN Marylyn Ishihara, Tyrone A, DO   0.5 mg at 08/07/21 2228   alum & mag hydroxide-simeth (MAALOX/MYLANTA) 200-200-20 MG/5ML suspension 30 mL  30 mL Oral Q4H PRN Eugenie Filler, MD   30 mL at 07/31/21 0930   arformoterol (BROVANA) nebulizer solution 15 mcg  15 mcg Nebulization BID Marylyn Ishihara, Tyrone A, DO   15 mcg at 08/09/21 0744   atorvastatin (LIPITOR) tablet 40 mg  40 mg Oral Daily Kyle, Tyrone A, DO   40 mg at 08/09/21 1093   benzonatate (TESSALON) capsule 200 mg  200 mg Oral TID PRN Cherylann Ratel A, DO   200 mg at 08/08/21 2221   budesonide (PULMICORT) nebulizer solution 0.5 mg  0.5 mg Nebulization BID Eugenie Filler, MD   0.5 mg at 08/09/21 0744   carbidopa-levodopa (SINEMET IR) 25-100 MG per tablet immediate release 1 tablet  1 tablet Oral 3 times per day Cherylann Ratel A, DO   1 tablet  at 08/09/21 1653   cefTRIAXone (ROCEPHIN) 2 g in sodium chloride 0.9 % 100 mL IVPB  2 g Intravenous Q24H Brand Males, MD 200 mL/hr at 08/09/21 1212 2 g at 08/09/21 1212   chlorhexidine (PERIDEX) 0.12 % solution 15 mL  15 mL Mouth Rinse BID Kyle, Tyrone A, DO   15 mL at 08/09/21 0836   Chlorhexidine Gluconate Cloth 2 % PADS 6 each  6 each Topical Daily Kyle, Tyrone A, DO   6 each at 08/09/21 0836   chlorpheniramine-HYDROcodone (TUSSIONEX) 10-8 MG/5ML suspension 5 mL  5 mL Oral Q12H PRN Cherylann Ratel A, DO   5 mL at 08/08/21 2222   dofetilide (TIKOSYN) capsule 500 mcg  500 mcg Oral BID Marylyn Ishihara, Tyrone A, DO   500 mcg at 08/09/21 0835   fenofibrate tablet 160 mg  160 mg Oral Daily Kyle, Tyrone A, DO   160 mg at 08/09/21 0835   fluticasone (FLONASE) 50 MCG/ACT nasal spray 2  spray  2 spray Each Nare Daily Kyle, Tyrone A, DO   2 spray at 76/80/88 1103   folic acid (FOLVITE) tablet 1 mg  1 mg Oral Daily Eudelia Bunch, RPH   1 mg at 08/09/21 0834   guaiFENesin (MUCINEX) 12 hr tablet 1,200 mg  1,200 mg Oral BID Eugenie Filler, MD   1,200 mg at 08/09/21 0834   HYDROcodone-acetaminophen (NORCO/VICODIN) 5-325 MG per tablet 1-2 tablet  1-2 tablet Oral Q6H PRN Cherylann Ratel A, DO   1 tablet at 07/28/21 2254   ipratropium (ATROVENT) 0.03 % nasal spray 1-2 spray  1-2 spray Each Nare Q12H Eugenie Filler, MD   2 spray at 08/09/21 1594   ipratropium (ATROVENT) nebulizer solution 0.5 mg  0.5 mg Nebulization BID Donne Hazel, MD   0.5 mg at 08/09/21 0744   levalbuterol (XOPENEX) nebulizer solution 0.63 mg  0.63 mg Nebulization BID Donne Hazel, MD   0.63 mg at 08/09/21 0744   lip balm (CARMEX) ointment   Topical PRN Donne Hazel, MD       loperamide (IMODIUM) capsule 2 mg  2 mg Oral Daily PRN Eugenie Filler, MD       loratadine (CLARITIN) tablet 10 mg  10 mg Oral Daily PRN Eudelia Bunch, RPH   10 mg at 08/01/21 0850   magnesium oxide (MAG-OX) tablet 400 mg  400 mg Oral Daily Eugenie Filler, MD   400 mg at 08/09/21 0835   MEDLINE mouth rinse  15 mL Mouth Rinse q12n4p Kyle, Tyrone A, DO   15 mL at 08/09/21 1213   metoprolol succinate (TOPROL-XL) 24 hr tablet 25 mg  25 mg Oral Daily Eugenie Filler, MD   25 mg at 08/09/21 0835   metroNIDAZOLE (FLAGYL) IVPB 500 mg  500 mg Intravenous BID Brand Males, MD 100 mL/hr at 08/09/21 0840 500 mg at 08/09/21 0840   multivitamin with minerals tablet 1 tablet  1 tablet Oral Daily Marylyn Ishihara, Tyrone A, DO   1 tablet at 08/09/21 0834   ondansetron (ZOFRAN) tablet 4 mg  4 mg Oral Q6H PRN Cherylann Ratel A, DO       Or   ondansetron (ZOFRAN) injection 4 mg  4 mg Intravenous Q6H PRN Marylyn Ishihara, Tyrone A, DO   4 mg at 08/08/21 1050   pantoprazole (PROTONIX) EC tablet 40 mg  40 mg Oral Daily Leodis Sias T, RPH   40 mg at 08/09/21 0835   potassium chloride (KLOR-CON) CR tablet 10 mEq  10 mEq Oral Daily Eugenie Filler, MD   10 mEq at 08/09/21 0835   predniSONE (DELTASONE) tablet 100 mg  100 mg Oral Q breakfast Eugenie Filler, MD   100 mg at 08/09/21 0835   prochlorperazine (COMPAZINE) injection 10 mg  10 mg Intravenous Q6H PRN Eugenie Filler, MD   10 mg at 07/30/21 0914   promethazine (PHENERGAN) tablet 25 mg  25 mg Oral Q6H PRN Cherylann Ratel A, DO   25 mg at 07/29/21 1837   rivaroxaban (XARELTO) tablet 20 mg  20 mg Oral Q supper Eugenie Filler, MD   20 mg at 08/09/21 1653   sodium chloride flush (NS) 0.9 % injection 10-40 mL  10-40 mL Intracatheter PRN Cherylann Ratel A, DO       tamsulosin (FLOMAX) capsule 0.4 mg  0.4 mg Oral QPC supper Marylyn Ishihara, Tyrone A, DO   0.4 mg at 08/09/21 1653   topiramate (TOPAMAX) tablet  100 mg  100 mg Oral BID Marylyn Ishihara, Tyrone A, DO   100 mg at 08/09/21 0835   traMADol (ULTRAM) tablet 50 mg  50 mg Oral Q6H PRN Cherylann Ratel A, DO       traZODone (DESYREL) tablet 25 mg  25 mg Oral QHS PRN Marylyn Ishihara, Tyrone A, DO   25 mg at 08/05/21 2228     Discharge Medications: Please see discharge summary for a list of discharge  medications.  Relevant Imaging Results:  Relevant Lab Results:   Additional Information [id number]  Ross Ludwig, LCSW

## 2021-08-09 NOTE — Treatment Plan (Signed)
Attempted to call [facility] for Peer to Peer. Went to [employer] as office is currently closed.

## 2021-08-10 DIAGNOSIS — L03116 Cellulitis of left lower limb: Secondary | ICD-10-CM | POA: Diagnosis not present

## 2021-08-10 DIAGNOSIS — I10 Essential (primary) hypertension: Secondary | ICD-10-CM | POA: Diagnosis not present

## 2021-08-10 MED ORDER — DAPSONE 100 MG PO TABS
100.0000 mg | ORAL_TABLET | Freq: Every day | ORAL | Status: DC
  Administered 2021-08-10 – 2021-08-12 (×3): 100 mg via ORAL
  Filled 2021-08-10 (×3): qty 1

## 2021-08-10 MED ORDER — POLYETHYLENE GLYCOL 3350 17 G PO PACK
17.0000 g | PACK | Freq: Every day | ORAL | Status: DC | PRN
  Administered 2021-08-10 – 2021-08-11 (×2): 17 g via ORAL
  Filled 2021-08-10 (×2): qty 1

## 2021-08-10 NOTE — Progress Notes (Signed)
PROGRESS NOTE    Corey [name]  ZOX:[id number] DOB: 10/16/1945 DOA: 07/28/2021 PCP: [name], MD    Brief Narrative:  75 year old gentleman history of non-small cell lung cancer on chemotherapy, OSA, A. fib, coronary artery disease presented with left lower extremity swelling and generalized weakness.  Patient noted to have recently been treated with Levaquin for pneumonia which she completed the day prior to his erythema.  Patient presented with shortness of breath from pneumonia.  Chest x-ray done showed unchanged left lower lobe opacity, no fevers recorded, patient on also noted with a left lower extremity cellulitis.  Patient placed on IV vancomycin cefepime and admitted for further evaluation and management.  Patient noted the night of admission 12 intractable nausea and vomiting and abdominal films and chest x-ray ordered.  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 -Was on IV vancomycin and IV Rocephin.   -IV vancomycin discontinued.   -Blood cultures with no growth to date.   -Cellulitis has been treated with a full course of antibiotics at this time.  2.  Acute respiratory failure with hypoxia likely secondary to aspiration pneumonia versus drug-induced pneumonitis  (Keytruda) versus BOOP -Patient noted to have worsening respiratory status with increased O2 requirements the evening of 07/31/2021. -Pt needing as high as 8L high flow O2, still attemtping to wean gradually over time - Candida albicans, viridans Streptococcus.. -Sed rate > 140.  -Appreciate input by PCCM as well as oncology.  Recommendation for starting Bactrim prophylaxis as G6PD is normal, patient remains on high-dose steroids for Keytruda pneumonitis per oncology recommendations. -Pulmonary has recommended Unasyn and neuropathic coverage for total of 7 days -Continue scheduled nebs. -Continue with bipap nightly -PCCM and oncology have been following.  Pulmonary has since signed off  3.  Nausea vomiting -Patient noted to have intractable nausea vomiting the evening of 07/29/2021, morning of 07/30/2021 . -Patient currently tolerating dysphagia 3 diet, tolerating thus far, resolved -Seen by SLP and underwent mbs   4.  aspiration pneumonia/CAP versus BOOP/drug-induced pneumonitis.  -Patient noted with intractable nausea and vomiting the evening of 07/29/2021 and in the morning of 07/30/2021.  -Repeat chest x-ray done 08/01/2021 with patchy AICD in the right lung corresponds to groundglass opacities on CT, with mild interval progression.  Chronic volume loss in the left hemothorax with pleural parenchymal opacity in the left lower lung.  -MRSA PCR negative.   -Sputum gram stain and culture with rare Candida albicans, rare variant Streptococcus.    -Patient status post course of azithromycin. -Patient with worsening ongoing hypoxia with sats in the 83% on 6 L nasal cannula with exertion as noted on PT note on 08/03/2021.   -Repeat chest x-ray with extensive bilateral pneumonia as previously seen with increasing infiltrates on  the right since prior study.  -Continue Mucinex, Brovana, Flonase, Brovana, Pulmicort. -Per above, recommendation for total of 7 days of Unasyn per pulmonary  recommendations at this time -PCCM following and appreciate input and recommendations. -Continued BIPAP as tolerated  5.  Hypokalemia/hypomagnesemia/hyponatremia -resolved -Patient on Tikosyn and as such goal is to keep potassium around 4, magnesium at 2.   6.  Stage IV non-small cell lung cancer -On palliative chemotherapy per oncology, Dr. Lorna Few. -Continue nebs/inhalers. -Patient's oncologist ordered staging CT scans on 07/31/2021.   -Patient seen by oncology -Concerned that patient may have Keytruda induced pneumonitis and as such this will not be resumed on discharge per oncology. Pt is continued on steroids per Oncology  7.  Paroxysmal atrial fibrillation -Patient with intractable nausea vomiting unable to tolerate any oral intake early on during the hospitalization which has since resolved. -Patient transitioned from IV Lopressor to oral home dose Lopressor.  -Continue Tikosyn.   -Keep potassium approximately 4, keep magnesium approximately 2.   -Was on full dose Lovenox and transitioned back to home dose Xarelto, currently stable -Without evidence of acute blood loss  8.  Hyperlipidemia -Lipitor, fenofibrate.   9.  Coronary artery disease/hypertension -IV metoprolol has been transition back to home dose oral metoprolol.  Continue statin, anticoagulation.   10.  Parkinson's -Sinemet  11.  Prolonged QT -Repeat EKG with resolution of QT prolongation currently at 467 from 530 on admission. -Magnesium currently at 1.8, potassium at 3.9.   -Magnesium sulfate 4 g IV x1. -Keep potassium approximately at 4, magnesium approximately 2.  12.  OSA -Nausea and vomiting improved no further nausea and vomiting noted.   -Was on CPAP nightly however due to respiratory issues has been placed on BiPAP.  13.  History of CVA -No overt bleeding.  -Xarelto for secondary stroke prophylaxis.   -Pt is continued on statin   14.  Generalized weakness -PT/OT. -PT recommending SNF  however patient and family would prefer to go home with home health therapies. -Initial PT recommendations for CIR.  However, patient reports he is unable to tolerate 3 hours of intensive therapy per day.  In fact, patient states he struggles to stand or even sit at the edge of the bed for more than a minute at a time -Transitions of care following, initial plans for possible LTAC  15.  Anemia -Likely secondary to chemotherapy. -Patient with no overt bleeding. -Hemoglobin at 7.0 on 08/02/2021. -Status post transfusion 2 units packed red blood cells 08/02/2021  -Remains hemodynamicallly stalbe   DVT prophylaxis: Xarelto Code Status: DNR Family Communication: Pt in room, family not at bedside  Status is: Inpatient  Remains inpatient appropriate because: severity of ill ness   Consultants:  PCCM Oncology  Procedures:  Abdominal x-ray 07/30/2021 Chest x-ray 07/30/2021, 08/01/2021 Bilateral lower extremity Dopplers 07/30/2021 CT chest/CT abdomen and pelvis 07/31/2021 Transfused 2 units packed red blood cells 08/02/2021  Antimicrobials: Anti-infectives (From admission, onward)    Start     Dose/Rate Route Frequency Ordered Stop   08/10/21 1200  dapsone tablet 100 mg        100 mg Oral Daily 08/10/21 1114     08/06/21 1215  cefTRIAXone (ROCEPHIN) 2 g in sodium chloride 0.9 % 100 mL IVPB        2 g 200 mL/hr over 30 Minutes Intravenous Every 24 hours 08/06/21 1120 08/10/21 1311   08/06/21 1215  metroNIDAZOLE (FLAGYL) IVPB 500 mg        500 mg 100  mL/hr over 60 Minutes Intravenous 2 times daily 08/06/21 1120 08/11/21 0959   08/04/21 2000  metroNIDAZOLE (FLAGYL) IVPB 500 mg  Status:  Discontinued        500 mg 100 mL/hr over 60 Minutes Intravenous Every 12 hours 08/04/21 1413 08/04/21 1621   08/04/21 1500  ceFEPIme (MAXIPIME) 2 g in sodium chloride 0.9 % 100 mL IVPB  Status:  Discontinued        2 g 200 mL/hr over 30 Minutes Intravenous Every 8 hours 08/04/21 1413 08/06/21 1058    08/03/21 1000  fluconazole (DIFLUCAN) tablet 200 mg  Status:  Discontinued        200 mg Oral Daily 08/03/21 0739 08/04/21 1347   08/02/21 2000  metroNIDAZOLE (FLAGYL) IVPB 500 mg  Status:  Discontinued        500 mg 100 mL/hr over 60 Minutes Intravenous Every 12 hours 08/02/21 1557 08/04/21 1413   08/02/21 1300  metroNIDAZOLE (FLAGYL) IVPB 500 mg  Status:  Discontinued        500 mg 100 mL/hr over 60 Minutes Intravenous Every 12 hours 08/02/21 1150 08/02/21 1557   08/02/21 1000  fluconazole (DIFLUCAN) IVPB 200 mg  Status:  Discontinued        200 mg 100 mL/hr over 60 Minutes Intravenous Every 24 hours 08/02/21 0827 08/03/21 0739   08/01/21 1000  azithromycin (ZITHROMAX) tablet 500 mg  Status:  Discontinued        500 mg Oral Daily 07/31/21 1438 08/03/21 1111   07/30/21 1000  azithromycin (ZITHROMAX) 500 mg in sodium chloride 0.9 % 250 mL IVPB  Status:  Discontinued        500 mg 250 mL/hr over 60 Minutes Intravenous Every 24 hours 07/30/21 0755 07/31/21 1438   07/30/21 0000  vancomycin (VANCOCIN) IVPB 1000 mg/200 mL premix  Status:  Discontinued        1,000 mg 200 mL/hr over 60 Minutes Intravenous Every 12 hours 07/29/21 0854 07/31/21 1436   07/29/21 1030  vancomycin (VANCOREADY) IVPB 2000 mg/400 mL        2,000 mg 200 mL/hr over 120 Minutes Intravenous  Once 07/29/21 0844 07/29/21 1447   07/29/21 1000  cefTRIAXone (ROCEPHIN) 2 g in sodium chloride 0.9 % 100 mL IVPB  Status:  Discontinued        2 g 200 mL/hr over 30 Minutes Intravenous Every 24 hours 07/29/21 0832 08/04/21 1413   07/28/21 1600  vancomycin (VANCOCIN) IVPB 1000 mg/200 mL premix        1,000 mg 200 mL/hr over 60 Minutes Intravenous  Once 07/28/21 1545 07/28/21 1800   07/28/21 1600  ceFEPIme (MAXIPIME) 2 g in sodium chloride 0.9 % 100 mL IVPB        2 g 200 mL/hr over 30 Minutes Intravenous  Once 07/28/21 1545 07/28/21 1701   07/28/21 1400  ceFAZolin (ANCEF) IVPB 2g/100 mL premix  Status:  Discontinued        2  g 200 mL/hr over 30 Minutes Intravenous Every 8 hours 07/28/21 1334 07/28/21 1545       Subjective: Reports tolerating PT better today. Improving endurance. Hoping to start therapy soon  Objective: Vitals:   08/10/21 0500 08/10/21 0512 08/10/21 0901 08/10/21 1204  BP:  132/86  117/71  Pulse:  74  92  Resp:    16  Temp:  97.8 F (36.6 C)  (!) 97.4 F (36.3 C)  TempSrc:  Axillary    SpO2:  98% 95% 92%  Weight:  96.3 kg     Height:        Intake/Output Summary (Last 24 hours) at 08/10/2021 1519 Last data filed at 08/10/2021 0900 Gross per 24 hour  Intake 1155.95 ml  Output 1500 ml  Net -344.05 ml    Filed Weights   08/07/21 0330 08/08/21 0500 08/10/21 0500  Weight: 100.7 kg 100.9 kg 96.3 kg    Examination: General exam: Conversant, in no acute distress Respiratory system: normal chest rise, clear, no audible wheezing Cardiovascular system: regular rhythm, s1-s2 Gastrointestinal system: Nondistended, nontender, pos BS Central nervous system: No seizures, no tremors Extremities: No cyanosis, no joint deformities Skin: No rashes, no pallor Psychiatry: Affect normal // no auditory hallucinations   Data Reviewed: I have personally reviewed following labs and imaging studies  CBC: Recent Labs  Lab 08/04/21 0917 08/05/21 0643 08/06/21 0453 08/09/21 0539  WBC 6.3 4.5 8.1 8.0  NEUTROABS 5.1 4.1  --   --   HGB 9.4* 9.7*  9.7* 9.6* 10.4*  HCT 28.3* 29.9* 29.8* 33.3*  MCV 96.6 98.4 98.3 100.6*  PLT 203 191 247 660    Basic Metabolic Panel: Recent Labs  Lab 08/04/21 0917 08/05/21 0643 08/06/21 0453 08/09/21 0539  NA 129* 134* 133* 137  K 3.9 4.3 4.5 4.1  CL 98 102 103 101  CO2 25 24 24 28   GLUCOSE 107* 150* 132* 93  BUN 12 19 23 20   CREATININE 0.67 0.70 0.68 0.53*  CALCIUM 8.1* 8.8* 9.0 9.0  MG 1.8 2.3  --   --   PHOS  --  4.1  --   --     GFR: Estimated Creatinine Clearance: 92.9 mL/min (A) (by C-G formula based on SCr of 0.53 mg/dL (L)). Liver  Function Tests: Recent Labs  Lab 08/05/21 0643 08/06/21 0453 08/09/21 0539  AST 31 46* 29  ALT 13 20 23   ALKPHOS 61 65 60  BILITOT 0.7 0.5 0.5  PROT 6.8 6.6 6.5  ALBUMIN 2.1* 2.1* 2.2*    No results for input(s): LIPASE, AMYLASE in the last 168 hours. No results for input(s): AMMONIA in the last 168 hours. Coagulation Profile: No results for input(s): INR, PROTIME in the last 168 hours. Cardiac Enzymes: No results for input(s): CKTOTAL, CKMB, CKMBINDEX, TROPONINI in the last 168 hours. BNP (last 3 results) No results for input(s): PROBNP in the last 8760 hours. HbA1C: No results for input(s): HGBA1C in the last 72 hours. CBG: No results for input(s): GLUCAP in the last 168 hours. Lipid Profile: No results for input(s): CHOL, HDL, LDLCALC, TRIG, CHOLHDL, LDLDIRECT in the last 72 hours. Thyroid Function Tests: No results for input(s): TSH, T4TOTAL, FREET4, T3FREE, THYROIDAB in the last 72 hours. Anemia Panel: No results for input(s): VITAMINB12, FOLATE, FERRITIN, TIBC, IRON, RETICCTPCT in the last 72 hours. Sepsis Labs: Recent Labs  Lab 08/04/21 0917 08/05/21 0643  PROCALCITON 0.31 0.19     No results found for this or any previous visit (from the past 240 hour(s)).     Radiology Studies: No results found.  Scheduled Meds:  acidophilus   Oral Daily   arformoterol  15 mcg Nebulization BID   atorvastatin  40 mg Oral Daily   budesonide (PULMICORT) nebulizer solution  0.5 mg Nebulization BID   carbidopa-levodopa  1 tablet Oral 3 times per day   chlorhexidine  15 mL Mouth Rinse BID   Chlorhexidine Gluconate Cloth  6 each Topical Daily   dapsone  100 mg Oral Daily   dofetilide  500 mcg Oral BID   fenofibrate  160 mg Oral Daily   fluticasone  2 spray Each Nare Daily   folic acid  1 mg Oral Daily   guaiFENesin  1,200 mg Oral BID   ipratropium  1-2 spray Each Nare Q12H   ipratropium  0.5 mg Nebulization BID   levalbuterol  0.63 mg Nebulization BID   magnesium  oxide  400 mg Oral Daily   mouth rinse  15 mL Mouth Rinse q12n4p   metoprolol succinate  25 mg Oral Daily   multivitamin with minerals  1 tablet Oral Daily   pantoprazole  40 mg Oral Daily   potassium chloride  10 mEq Oral Daily   predniSONE  100 mg Oral Q breakfast   rivaroxaban  20 mg Oral Q supper   tamsulosin  0.4 mg Oral QPC supper   topiramate  100 mg Oral BID   Continuous Infusions:  metronidazole 500 mg (08/10/21 0852)     LOS: 12 days   Marylu Lund, MD [facility] Pager On Amion  If 7PM-7AM, please contact night-coverage 08/10/2021, 3:19 PM

## 2021-08-10 NOTE — TOC Progression Note (Signed)
Transition of Care [facility]) - Progression Note    Patient Details  Name: Corey [name] MRN: [id number] Date of Birth: 11/10/1945  Transition of Care [facility]) CM/SW Contact  Aniello Christopoulos, Juliann Pulse, RN Phone Number: 08/10/2021, 11:42 AM  Clinical Narrative: Noted PT recc CIR;CIR rep following.      Expected Discharge Plan: IP Rehab Facility Barriers to Discharge: Insurance Authorization  Expected Discharge Plan and Services Expected Discharge Plan: [place]   Discharge Planning Services: CM Consult   Living arrangements for the past 2 months: Single Family Home                           HH Arranged: PT, OT, Social Work [employer] Agency: [place] Date [place]: 07/31/21 Time [place]: 1412 Representative spoke with at [place]: Cherl rose   Social Determinants of Health ([place]) Interventions    Readmission Risk Interventions Readmission Risk Prevention Plan 08/05/2021  Transportation Screening Complete  PCP or Specialist appointment within 3-5 days of discharge Complete  HRI or [place] Complete  SW Recovery Care/Counseling Consult Complete  [place] Not Applicable  Some recent data might be hidden

## 2021-08-10 NOTE — Progress Notes (Signed)
Inpatient Rehab Admissions Coordinator:   PT, physician, and Pt. Now feel pt. Is able to tolerate the intensity of CIR. I will re-open a case with pt.'s insurance and pursue for potential admit pending insurance auth and bed availability.   Clemens Catholic, [place], [place] Admissions Coordinator  [phone] ([place]) [phone] (office)

## 2021-08-10 NOTE — Progress Notes (Signed)
Physical Therapy Treatment Patient Details Name: Corey [name] MRN: [id number] DOB: 1946-02-24 Today's Date: 08/10/2021   History of Present Illness 75yo male who presented on 10/25 with L LE edema and generalized weakness. Also with recent hx of PNA. Admitted with L LE cellulitis. PMH NSCLC on chemo, Afib, CAD, chronic LBP with radiculopathy, HLD, HTN, prostate CA, CVA, ACDF, back surgery, L rotator cuff repair, TKA    PT Comments    The [patient reports that he feels stronger today.  Patient resting  on 2 L 97%. BP 126/80 HR 95   RN recommended to Increase  to 4 L for activity.  Patient mobilized  self to sitting up from sidelying with HOB raised and use of rail.  Patient  performed 3 Sit to stand x 3 with min guard to power up from bed.  Patient's BP  126/80, sitting and after standing 103/88 then 99/71 after pivot to recliner. HR range from 92 to 118  SPO2 on 2 L  95%, Dropped to 88% during  activity. Patient may benefit from CIR as patient tolerated increased activity and VS more stable.  Replaced back to 2 L at 92% SPO2, HR 108  Recommendations for follow up therapy are one component of a multi-disciplinary discharge planning process, led by the attending physician.  Recommendations may be updated based on patient status, additional functional criteria and insurance authorization.  Follow Up Recommendations  Acute inpatient rehab (3hours/day)     Assistance Recommended at Discharge Frequent or constant Supervision/Assistance  Equipment Recommendations  None recommended by PT    Recommendations for Other Services       Precautions / Restrictions Precautions Precautions: Fall Precaution Comments: monitor sats  and BP     Mobility  Bed Mobility Overal bed mobility: Needs Assistance Bed Mobility: Rolling;Sidelying to Sit Rolling: Min guard Sidelying to sit: Min assist;HOB elevated       General bed mobility comments: cues to roll to the left.  able to place legs over  bed edge, extra time but gradually ABLE TO PUSH HIMSELF TO UPRIGHT WITH HOB raised to ~ 45*. [place] to use both UE's to scoot to bed edge with extra assistance    Transfers Overall transfer level: Needs assistance Equipment used: Rolling walker (2 wheels) Transfers: Sit to/from Stand Sit to Stand: Min guard;+2 safety/equipment;From elevated surface Stand pivot transfers: Mod assist;+2 safety/equipment         General transfer comment: Bed raised  a few inches. patient able to pull self up to stnading without extra support except support RW. Patient performed 3 times.    Ambulation/Gait               General Gait Details: First stand, marched in place x 5, sat to rest, then stood againa nd steped in place x 8. 3rd time stepped to recliner using RW with noted fatigue but no buckling.Patient performed 5  timthen 8   Stairs             Wheelchair Mobility    Modified Rankin (Stroke Patients Only)       Balance   Sitting-balance support: No upper extremity supported;Feet supported Sitting balance-Leahy Scale: Fair Sitting balance - Comments: cues for head posture, tends to be flexed at neck   Standing balance support: Bilateral upper extremity supported;Reliant on assistive device for balance Standing balance-Leahy Scale: Poor  Cognition   Behavior During Therapy: WFL for tasks assessed/performed Overall Cognitive Status: Within Functional Limits for tasks assessed                                 General Comments: very motivated and pleasant,        Exercises General Exercises - Lower Extremity Ankle Circles/Pumps: AROM;Both;10 reps Long Arc Quad: AROM;Both;10 reps;Seated Hip Flexion/Marching: AROM;Both;10 reps;Standing    General Comments        Pertinent Vitals/Pain Pain Assessment: No/denies pain    Home Living                          Prior Function            PT Goals  (current goals can now be found in the care plan section) Progress towards PT goals: Progressing toward goals    Frequency    Min 3X/week      PT Plan Current plan remains appropriate;Frequency needs to be updated    Co-evaluation              AM-PAC PT "6 Clicks" Mobility   Outcome Measure  Help needed turning from your back to your side while in a flat bed without using bedrails?: A Lot Help needed moving from lying on your back to sitting on the side of a flat bed without using bedrails?: A Lot Help needed moving to and from a bed to a chair (including a wheelchair)?: A Lot Help needed standing up from a chair using your arms (e.g., wheelchair or bedside chair)?: A Lot Help needed to walk in hospital room?: Total Help needed climbing 3-5 steps with a railing? : Total 6 Click Score: 10    End of Session Equipment Utilized During Treatment: Gait belt;Oxygen Activity Tolerance: Patient tolerated treatment well Patient left: in chair;with call bell/phone within reach;with chair alarm set Nurse Communication: Mobility status PT Visit Diagnosis: Unsteadiness on feet (R26.81);Muscle weakness (generalized) (M62.81);Difficulty in walking, not elsewhere classified (R26.2)     Time: 4562-5638 PT Time Calculation (min) (ACUTE ONLY): 42 min  Charges:  $Therapeutic Exercise: 8-22 mins $Therapeutic Activity: 23-37 mins                     Tresa Endo PT Acute Rehabilitation Services Pager [phone] Office [phone]    Claretha Cooper 08/10/2021, 11:34 AM

## 2021-08-11 DIAGNOSIS — I10 Essential (primary) hypertension: Secondary | ICD-10-CM | POA: Diagnosis not present

## 2021-08-11 DIAGNOSIS — J9601 Acute respiratory failure with hypoxia: Secondary | ICD-10-CM | POA: Diagnosis not present

## 2021-08-11 DIAGNOSIS — L03116 Cellulitis of left lower limb: Secondary | ICD-10-CM | POA: Diagnosis not present

## 2021-08-11 LAB — COMPREHENSIVE METABOLIC PANEL
ALT: 17 U/L (ref 0–44)
AST: 27 U/L (ref 15–41)
Albumin: 2.3 g/dL — ABNORMAL LOW (ref 3.5–5.0)
Alkaline Phosphatase: 58 U/L (ref 38–126)
Anion gap: 6 (ref 5–15)
BUN: 23 mg/dL (ref 8–23)
CO2: 26 mmol/L (ref 22–32)
Calcium: 8.7 mg/dL — ABNORMAL LOW (ref 8.9–10.3)
Chloride: 103 mmol/L (ref 98–111)
Creatinine, Ser: 0.76 mg/dL (ref 0.61–1.24)
GFR, Estimated: >60 mL/min (ref >60)
Glucose, Bld: 129 mg/dL — ABNORMAL HIGH (ref 70–99)
Potassium: 4.1 mmol/L (ref 3.5–5.1)
Sodium: 135 mmol/L (ref 135–145)
Total Bilirubin: 0.6 mg/dL (ref 0.3–1.2)
Total Protein: 6.2 g/dL — ABNORMAL LOW (ref 6.5–8.1)

## 2021-08-11 LAB — CBC
HCT: 32.4 % — ABNORMAL LOW (ref 39.0–52.0)
Hemoglobin: 10.5 g/dL — ABNORMAL LOW (ref 13.0–17.0)
MCH: 31.8 pg (ref 26.0–34.0)
MCHC: 32.4 g/dL (ref 30.0–36.0)
MCV: 98.2 fL (ref 80.0–100.0)
Platelets: 297 10*3/uL (ref 150–400)
RBC: 3.3 MIL/uL — ABNORMAL LOW (ref 4.22–5.81)
RDW: 19.1 % — ABNORMAL HIGH (ref 11.5–15.5)
WBC: 7.6 10*3/uL (ref 4.0–10.5)
nRBC: 0 % (ref 0.0–0.2)

## 2021-08-11 LAB — CUP PACEART REMOTE DEVICE CHECK
Date Time Interrogation Session: 20221105101952
Implantable Pulse Generator Implant Date: 20210526

## 2021-08-11 NOTE — Progress Notes (Signed)
I ran into patient's wife in the hall and spent time with her as she shared some about Rylie's health journey over the past year. Provided reflective listening and ministry of presence.  [employer] Pager, [phone] 3:17 PM

## 2021-08-11 NOTE — Care Management Important Message (Signed)
Important Message  Patient Details IM Letter placed in the Patients room. Name: Corey [name] MRN: [id number] Date of Birth: May 15, 1946   Medicare Important Message Given:  Other (see comment) (Medicare IM printed for W/L Social Work to give to the patient.)     Kerin Salen 08/11/2021, 10:30 AM

## 2021-08-11 NOTE — TOC Progression Note (Addendum)
Transition of Care [facility]) - Progression Note    Patient Details  Name: Corey [name] MRN: [id number] Date of Birth: 05/20/1946  Transition of Care [facility]) CM/SW Contact  Elianna Windom, Juliann Pulse, RN Phone Number: 08/11/2021, 10:33 AM  Clinical Narrative: -awaiting CIR p2p by 5p-attending aware of deadline if agree to do P2P. 10:39a-Has bed offers for SNF-will await outcome of CIR.  12:31p-Dr. Wyline Copas the tel# is [phone] ext [phone]-this is the number to set up the peer to peer, not to talk immediately. 3:53p-CIR P2P approved-CIR rep following for bed availability.    Expected Discharge Plan: IP Rehab Facility Barriers to Discharge: Insurance Authorization  Expected Discharge Plan and Services Expected Discharge Plan: [place]   Discharge Planning Services: CM Consult   Living arrangements for the past 2 months: Single Family Home                             Social Determinants of Health (SDOH) Interventions    Readmission Risk Interventions Readmission Risk Prevention Plan 08/05/2021  Transportation Screening Complete  PCP or Specialist appointment within 3-5 days of discharge Complete  HRI or [place] Complete  SW Recovery Care/Counseling Consult Complete  [place] Not Applicable  Some recent data might be hidden

## 2021-08-11 NOTE — Progress Notes (Signed)
Inpatient Rehab Admissions Coordinator:   I do not have a bed for this Pt. On CIR today. Insurance requested peer to peer. Dr. Wyline Copas to complete.   Clemens Catholic, [place], [place] Admissions Coordinator  [phone] ([place]) [phone] (office)

## 2021-08-11 NOTE — Progress Notes (Addendum)
PROGRESS NOTE    Corey [name]  WUJ:[id number] DOB: [birth year] DOA: 07/28/2021 PCP: [name], MD    Brief Narrative:  75 year old gentleman history of non-small cell lung cancer on chemotherapy, OSA, A. fib, coronary artery disease presented with left lower extremity swelling and generalized weakness.  Patient noted to have recently been treated with Levaquin for pneumonia which she completed the day prior to his erythema.  Patient presented with shortness of breath from pneumonia.  Chest x-ray done showed unchanged left lower lobe opacity, no fevers recorded, patient on also noted with a left lower extremity cellulitis.  Patient placed on IV vancomycin cefepime and admitted for further evaluation and management.  Patient noted the night of admission 12 intractable nausea and vomiting and abdominal films and chest x-ray ordered.  Assessment & Plan:   Principal Problem:   Cellulitis of left leg Active Problems:   Benign essential HTN   Dyslipidemia   GERD (gastroesophageal reflux disease)   AF (paroxysmal atrial fibrillation) (HCC)   OSA on CPAP   Hyponatremia   Essential hypertension   Cerebral embolism with transient ischemic attack (TIA)   History of stroke   Non-small cell carcinoma of left lung, stage 4 ([place])   Primary cancer of left lower lobe of lung (HCC)   CAP (community acquired pneumonia)   Symptomatic anemia   Malaise   Hypokalemia   Hypomagnesemia   Nausea & vomiting   Cellulitis of left lower extremity   Acute respiratory failure with hypoxia ([place])  #1 left lower extremity cellulitis -Patient with notable improvement left lower extremity cellulitis. -Blood cultures with no growth to date.   -Cellulitis has been treated with a full course of antibiotics at this time.  2.  Acute respiratory failure with hypoxia likely secondary to aspiration pneumonia versus drug-induced pneumonitis (Keytruda) versus BOOP -Patient noted to have worsening respiratory status  with increased O2 requirements the evening of 07/31/2021. -Initially requiring as high as 8L high flow O2, still attemtping to wean gradually over time - Candida albicans, viridans Streptococcus.. -Sed rate > 140.  -Appreciate input by PCCM as well as oncology.  Recommendation for PJP prophylaxis, now on dapsone. Patient remains on high-dose steroids for Keytruda pneumonitis per oncology recommendations. -Oncology recommends high dose steroids x 2 weeks followed by gradual tapered dose of prednisone (begin tapering after 08/18/21) -Pt has completed course of abx per Pulm recs -Continue scheduled nebs. -Continue with bipap nightly -PCCM and oncology had been following.  Pulmonary has since signed off  3.  Nausea vomiting -Patient noted to have intractable nausea vomiting the evening of 07/29/2021, morning of 07/30/2021 . -Patient currently tolerating dysphagia 3 diet, tolerating thus far, resolved -Seen by SLP and underwent mbs   4.  aspiration pneumonia/CAP versus BOOP/drug-induced pneumonitis.  -Patient noted with intractable nausea and vomiting the evening of 07/29/2021 and in the morning of 07/30/2021.  -Repeat chest x-ray done 08/01/2021 with patchy AICD in the right lung corresponds to groundglass opacities on CT, with mild interval progression.  Chronic volume loss in the left hemothorax with pleural parenchymal opacity in the left lower lung.  -MRSA PCR negative.   -Sputum gram stain and culture with rare Candida albicans, rare variant Streptococcus.    -Completed course of abx -On high dosed steroids per above -Weaning O2 down to 1-2LNC when at rest. When pt attempts ambulation or work with PT, noted to desaturate with markedly increased resp effort, requiring up to 5-6L [place] before gradually weaning back down  5.  Hypokalemia/hypomagnesemia/hyponatremia -resolved -Patient on Tikosyn and as such goal is to keep potassium around 4, magnesium at 2.   6.  Stage IV non-small cell lung  cancer -On palliative chemotherapy per oncology, Dr. Lorna Few. -Continue nebs/inhalers. -Patient's oncologist ordered staging CT scans on 07/31/2021.   -Patient seen by oncology -Concerns for Keytruda induced pneumonitis and as such this will not be resumed on discharge per oncology. Pt is continued on steroids per Oncology pre above  7.  Paroxysmal atrial fibrillation -Patient with intractable nausea vomiting unable to tolerate any oral intake early on during the hospitalization which has since resolved. -Patient transitioned from IV Lopressor to oral home dose Lopressor.  -Continue Tikosyn.   -Keep potassium approximately 4, keep magnesium approximately 2.   -Was on full dose Lovenox and transitioned back to home dose Xarelto, currently stable -Without evidence of acute blood loss  8.  Hyperlipidemia -Lipitor, fenofibrate.   9.  Coronary artery disease/hypertension -IV metoprolol has been transition back to home dose oral metoprolol.  Continue statin, anticoagulation.   10.  Parkinson's -Sinemet  11.  Prolonged QT -Repeat EKG with resolution of QT prolongation currently at 467 from 530 on admission. -Would aim to keep potassium approximately at 4, magnesium approximately 2.  12.  OSA -Nausea and vomiting improved no further nausea and vomiting noted.   -Was on CPAP nightly however due to respiratory issues has been placed on BiPAP.  13.  History of CVA -No overt bleeding.  -Xarelto for secondary stroke prophylaxis.   -Pt is continued on statin   14.  Generalized weakness -PT/OT following -Recs for CIR noted. TOC following  15.  Anemia -Likely secondary to chemotherapy. -Patient with no overt bleeding. -Status post transfusion 2 units packed red blood cells 08/02/2021  -Remains hemodynamicallly stalbe   DVT prophylaxis: Xarelto Code Status: DNR Family Communication: Pt in room, family currently at bedside  Status is: Inpatient  Remains inpatient  appropriate because: severity of ill ness   Consultants:  PCCM Oncology  Procedures:  Abdominal x-ray 07/30/2021 Chest x-ray 07/30/2021, 08/01/2021 Bilateral lower extremity Dopplers 07/30/2021 CT chest/CT abdomen and pelvis 07/31/2021 Transfused 2 units packed red blood cells 08/02/2021  Antimicrobials: Anti-infectives (From admission, onward)    Start     Dose/Rate Route Frequency Ordered Stop   08/10/21 1200  dapsone tablet 100 mg        100 mg Oral Daily 08/10/21 1114     08/06/21 1215  cefTRIAXone (ROCEPHIN) 2 g in sodium chloride 0.9 % 100 mL IVPB        2 g 200 mL/hr over 30 Minutes Intravenous Every 24 hours 08/06/21 1120 08/10/21 1311   08/06/21 1215  metroNIDAZOLE (FLAGYL) IVPB 500 mg        500 mg 100 mL/hr over 60 Minutes Intravenous 2 times daily 08/06/21 1120 08/10/21 2215   08/04/21 2000  metroNIDAZOLE (FLAGYL) IVPB 500 mg  Status:  Discontinued        500 mg 100 mL/hr over 60 Minutes Intravenous Every 12 hours 08/04/21 1413 08/04/21 1621   08/04/21 1500  ceFEPIme (MAXIPIME) 2 g in sodium chloride 0.9 % 100 mL IVPB  Status:  Discontinued        2 g 200 mL/hr over 30 Minutes Intravenous Every 8 hours 08/04/21 1413 08/06/21 1058   08/03/21 1000  fluconazole (DIFLUCAN) tablet 200 mg  Status:  Discontinued        200 mg Oral Daily 08/03/21 0739 08/04/21 1347   08/02/21 2000  metroNIDAZOLE (FLAGYL) IVPB 500 mg  Status:  Discontinued        500 mg 100 mL/hr over 60 Minutes Intravenous Every 12 hours 08/02/21 1557 08/04/21 1413   08/02/21 1300  metroNIDAZOLE (FLAGYL) IVPB 500 mg  Status:  Discontinued        500 mg 100 mL/hr over 60 Minutes Intravenous Every 12 hours 08/02/21 1150 08/02/21 1557   08/02/21 1000  fluconazole (DIFLUCAN) IVPB 200 mg  Status:  Discontinued        200 mg 100 mL/hr over 60 Minutes Intravenous Every 24 hours 08/02/21 0827 08/03/21 0739   08/01/21 1000  azithromycin (ZITHROMAX) tablet 500 mg  Status:  Discontinued        500 mg Oral Daily  07/31/21 1438 08/03/21 1111   07/30/21 1000  azithromycin (ZITHROMAX) 500 mg in sodium chloride 0.9 % 250 mL IVPB  Status:  Discontinued        500 mg 250 mL/hr over 60 Minutes Intravenous Every 24 hours 07/30/21 0755 07/31/21 1438   07/30/21 0000  vancomycin (VANCOCIN) IVPB 1000 mg/200 mL premix  Status:  Discontinued        1,000 mg 200 mL/hr over 60 Minutes Intravenous Every 12 hours 07/29/21 0854 07/31/21 1436   07/29/21 1030  vancomycin (VANCOREADY) IVPB 2000 mg/400 mL        2,000 mg 200 mL/hr over 120 Minutes Intravenous  Once 07/29/21 0844 07/29/21 1447   07/29/21 1000  cefTRIAXone (ROCEPHIN) 2 g in sodium chloride 0.9 % 100 mL IVPB  Status:  Discontinued        2 g 200 mL/hr over 30 Minutes Intravenous Every 24 hours 07/29/21 0832 08/04/21 1413   07/28/21 1600  vancomycin (VANCOCIN) IVPB 1000 mg/200 mL premix        1,000 mg 200 mL/hr over 60 Minutes Intravenous  Once 07/28/21 1545 07/28/21 1800   07/28/21 1600  ceFEPIme (MAXIPIME) 2 g in sodium chloride 0.9 % 100 mL IVPB        2 g 200 mL/hr over 30 Minutes Intravenous  Once 07/28/21 1545 07/28/21 1701   07/28/21 1400  ceFAZolin (ANCEF) IVPB 2g/100 mL premix  Status:  Discontinued        2 g 200 mL/hr over 30 Minutes Intravenous Every 8 hours 07/28/21 1334 07/28/21 1545       Subjective: States feeling better. Tolerating PT, also doing roughly 10min of exercises on his own. Determined to get stronger  Objective: Vitals:   08/11/21 0434 08/11/21 0758 08/11/21 0802 08/11/21 1315  BP: 134/74   121/78  Pulse: 76   89  Resp:    20  Temp: 97.8 F (36.6 C)   98.1 F (36.7 C)  TempSrc: Axillary   Oral  SpO2: 91% 95% 95% 94%  Weight:      Height:        Intake/Output Summary (Last 24 hours) at 08/11/2021 1335 Last data filed at 08/11/2021 1000 Gross per 24 hour  Intake 1060.22 ml  Output 950 ml  Net 110.22 ml    Filed Weights   08/08/21 0500 08/10/21 0500 08/11/21 0416  Weight: 100.9 kg 96.3 kg 96 kg     Examination: General exam: Awake, laying in bed, in nad Respiratory system: Normal respiratory effort, no wheezing Cardiovascular system: regular rate, s1, s2 Gastrointestinal system: Soft, nondistended, positive BS Central nervous system: CN2-12 grossly intact, strength intact Extremities: Perfused, no clubbing Skin: Normal skin turgor, no notable skin lesions seen Psychiatry: Mood normal //  no visual hallucinations   Data Reviewed: I have personally reviewed following labs and imaging studies  CBC: Recent Labs  Lab 08/05/21 0643 08/06/21 0453 08/09/21 0539 08/11/21 0316  WBC 4.5 8.1 8.0 7.6  NEUTROABS 4.1  --   --   --   HGB 9.7*  9.7* 9.6* 10.4* 10.5*  HCT 29.9* 29.8* 33.3* 32.4*  MCV 98.4 98.3 100.6* 98.2  PLT 191 247 288 657    Basic Metabolic Panel: Recent Labs  Lab 08/05/21 0643 08/06/21 0453 08/09/21 0539 08/11/21 0316  NA 134* 133* 137 135  K 4.3 4.5 4.1 4.1  CL 102 103 101 103  CO2 24 24 28 26   GLUCOSE 150* 132* 93 129*  BUN 19 23 20 23   CREATININE 0.70 0.68 0.53* 0.76  CALCIUM 8.8* 9.0 9.0 8.7*  MG 2.3  --   --   --   PHOS 4.1  --   --   --     GFR: Estimated Creatinine Clearance: 92.9 mL/min (by C-G formula based on SCr of 0.76 mg/dL). Liver Function Tests: Recent Labs  Lab 08/05/21 0643 08/06/21 0453 08/09/21 0539 08/11/21 0316  AST 31 46* 29 27  ALT 13 20 23 17   ALKPHOS 61 65 60 58  BILITOT 0.7 0.5 0.5 0.6  PROT 6.8 6.6 6.5 6.2*  ALBUMIN 2.1* 2.1* 2.2* 2.3*    No results for input(s): LIPASE, AMYLASE in the last 168 hours. No results for input(s): AMMONIA in the last 168 hours. Coagulation Profile: No results for input(s): INR, PROTIME in the last 168 hours. Cardiac Enzymes: No results for input(s): CKTOTAL, CKMB, CKMBINDEX, TROPONINI in the last 168 hours. BNP (last 3 results) No results for input(s): PROBNP in the last 8760 hours. HbA1C: No results for input(s): HGBA1C in the last 72 hours. CBG: No results for input(s):  GLUCAP in the last 168 hours. Lipid Profile: No results for input(s): CHOL, HDL, LDLCALC, TRIG, CHOLHDL, LDLDIRECT in the last 72 hours. Thyroid Function Tests: No results for input(s): TSH, T4TOTAL, FREET4, T3FREE, THYROIDAB in the last 72 hours. Anemia Panel: No results for input(s): VITAMINB12, FOLATE, FERRITIN, TIBC, IRON, RETICCTPCT in the last 72 hours. Sepsis Labs: Recent Labs  Lab 08/05/21 0643  PROCALCITON 0.19     No results found for this or any previous visit (from the past 240 hour(s)).     Radiology Studies: No results found.  Scheduled Meds:  acidophilus   Oral Daily   arformoterol  15 mcg Nebulization BID   atorvastatin  40 mg Oral Daily   budesonide (PULMICORT) nebulizer solution  0.5 mg Nebulization BID   carbidopa-levodopa  1 tablet Oral 3 times per day   chlorhexidine  15 mL Mouth Rinse BID   Chlorhexidine Gluconate Cloth  6 each Topical Daily   dapsone  100 mg Oral Daily   dofetilide  500 mcg Oral BID   fenofibrate  160 mg Oral Daily   fluticasone  2 spray Each Nare Daily   folic acid  1 mg Oral Daily   guaiFENesin  1,200 mg Oral BID   ipratropium  1-2 spray Each Nare Q12H   ipratropium  0.5 mg Nebulization BID   levalbuterol  0.63 mg Nebulization BID   magnesium oxide  400 mg Oral Daily   mouth rinse  15 mL Mouth Rinse q12n4p   metoprolol succinate  25 mg Oral Daily   multivitamin with minerals  1 tablet Oral Daily   pantoprazole  40 mg Oral Daily  potassium chloride  10 mEq Oral Daily   predniSONE  100 mg Oral Q breakfast   rivaroxaban  20 mg Oral Q supper   tamsulosin  0.4 mg Oral QPC supper   topiramate  100 mg Oral BID   Continuous Infusions:     LOS: 13 days   Marylu Lund, MD [facility] Pager On Amion  If 7PM-7AM, please contact night-coverage 08/11/2021, 1:35 PM

## 2021-08-11 NOTE — Plan of Care (Signed)
  Problem: Health Behavior/Discharge Planning: Goal: Ability to manage health-related needs will improve Outcome: Progressing   Problem: Clinical Measurements: Goal: Ability to maintain clinical measurements within normal limits will improve Outcome: Progressing Goal: Will remain free from infection Outcome: Progressing Goal: Diagnostic test results will improve Outcome: Progressing   

## 2021-08-11 NOTE — Progress Notes (Signed)
Occupational Therapy Treatment Patient Details Name: Corey [name] MRN: [id number] DOB: 1946-05-24 Today's Date: 08/11/2021   History of present illness 75yo male who presented on 10/25 with L LE edema and generalized weakness. Also with recent hx of PNA. Admitted with L LE cellulitis. 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 Pt described himself as very determined. Energy conservation concepts discussed during a pt rest break. OT Reinforced the "4 P's" and introduced RPE to pt to help judge when to take rest breaks to avoid overfatigue.  Pt showed RPE of about 8-9/10 in final seconds before loss of balance at sink and need of moderate assist to safely lower to his recliner.  Reinforced importance of pacing and resting when a little more tired than when starting an activity.  Pt very receptive to education and will benefit from reviewing his handout which pt agreed to.  sPt tolerated <1 complete grooming task while standing at sink with change in breathing to rapid and quickly recovered with sitting rest break. Patient remains limited by pain with cough, generalized weakness and decreased activity tolerance along with deficits noted below. Pt continues to demonstrate good rehab potential and would benefit from continued skilled OT to increase safety and independence with ADLs and functional transfers to allow pt to return home safely and reduce caregiver burden and fall risk.    Recommendations for follow up therapy are one component of a multi-disciplinary discharge planning process, led by the attending physician.  Recommendations may be updated based on patient status, additional functional criteria and insurance authorization.    Follow Up Recommendations   OT at [facility]term acute care hospital    Assistance Recommended at Discharge Frequent or constant Supervision/Assistance  Equipment Recommendations       Recommendations for Other Services      Precautions / Restrictions Precautions Precautions: Fall Precaution Comments: monitor sats  and BP Restrictions Weight Bearing Restrictions: No       Mobility Bed Mobility                    Transfers Overall transfer level: Needs assistance Equipment used: Rolling walker (2 wheels) Transfers: Sit to/from Stand Sit to Stand: Min guard Stand pivot transfers: Min assist               Balance Overall balance assessment: Needs assistance Sitting-balance support: No upper extremity supported;Feet supported Sitting balance-Leahy Scale: Fair     Standing balance support: Bilateral upper extremity supported;Reliant on assistive device for balance Standing balance-Leahy Scale: Poor Standing balance comment: Requires BUE support on RW. LOB and need for assistance to lower to chair.                           ADL either performed or assessed with clinical judgement   ADL       Grooming: Oral care;Standing Grooming Details (indicate cue type and reason): Pt tolerated <1 complete grooming task while standing at sink but with loss of balance while leaning forward to expectorate into sink. Pt required moderate assistance to ease into chair in order to avoid fall to floor as pt was unable to recover balance after expectorating.  Once safe in chair and after  rest break, pt completed oral hygiene and wiped face.                 Toilet Transfer: BSC/3in1;Rolling walker (2 wheels);Stand-pivot [employer] technical [employer] Details (indicate cue type and reason): After prolonged rest in recliner after grooming at sink, pt requested BSC for BM. Pt stood from recliner with Min guard and pivoted to [facility] using RW with Min guard. Min Assist needed to descend to commode. Hygiene not tested as  pt asked for privacy. Pt given call bell and Nurse informed of pt up on BSC. Toileting- Clothing Manipulation and Hygiene: Maximal assistance Toileting - Clothing Manipulation Details (indicate cue type and reason): Max Assist needed to manage clothing prior to sitting on BSC.     Functional mobility during ADLs: Min guard;Minimal assistance;Rolling walker (2 wheels)      Extremity/Trunk Assessment Upper Extremity Assessment Upper Extremity Assessment: Generalized weakness   Lower Extremity Assessment Lower Extremity Assessment: Generalized weakness   Cervical / Trunk Assessment Cervical / Trunk Assessment: Normal    Vision Baseline Vision/History: 1 Wears glasses Vision Assessment?: No apparent visual deficits   Perception     Praxis      Cognition Arousal/Alertness: Awake/alert Behavior During Therapy: WFL for tasks assessed/performed Overall Cognitive Status: Within Functional Limits for tasks assessed                                 General Comments: very motivated and pleasant,          Exercises Other Exercises Other Exercises: Pt reports that he has been using his orange therabands for UE and LE exercises in the bed and his hand strengthening balls.   Shoulder Instructions       General Comments      Pertinent Vitals/ Pain       Pain Assessment: Faces Faces Pain Scale: Hurts little more Pain Location: chest when coughing, reports it is normal Pain Descriptors / Indicators: Grimacing Pain Intervention(s): Limited activity within patient's tolerance;Monitored during session  Home Living                                          Prior Functioning/Environment              Frequency  Min 2X/week        Progress Toward Goals  OT Goals(current goals can now be found in the care plan section)  Progress towards OT goals: Progressing toward goals  Acute Rehab OT Goals Patient Stated Goal: Regain energy and  indepenedence OT Goal Formulation: With patient Time For Goal Achievement: 08/14/21 Potential to Achieve Goals: Good  Plan Discharge plan remains appropriate    Co-evaluation                 AM-PAC OT "6 Clicks" Daily Activity     Outcome Measure   Help from another person eating meals?: None Help from another person taking care of personal grooming?: A Little Help from another person toileting, which includes using toliet, bedpan, or urinal?: A Lot Help from another person bathing (including washing, rinsing, drying)?: A Lot Help from another person to put on and taking off regular upper body clothing?: A Little Help from another person to put on and taking off regular lower body clothing?: A Lot 6 Click Score: 16    End  of Session Equipment Utilized During Treatment: Gait belt;Rolling walker (2 wheels)  OT Visit Diagnosis: Unsteadiness on feet (R26.81)   Activity Tolerance Patient limited by fatigue   Patient Left with call bell/phone within reach;with nursing/sitter in room;with family/visitor present (On BSC. RN aware. Pt's brother, Mikki Santee in hallway)   Nurse Communication Other (comment) (pt on [facility])        Time: 6160-7371 OT Time Calculation (min): 30 min  Charges: OT General Charges $OT Visit: 1 Visit OT Treatments $Self Care/Home Management : 8-22 mins $Therapeutic Activity: 8-22 mins  Anderson Malta, OT Acute Rehab Services Office: [phone] 08/11/2021  Julien Girt 08/11/2021, 2:01 PM

## 2021-08-12 ENCOUNTER — Inpatient Hospital Stay (HOSPITAL_COMMUNITY)
Admission: RE | Admit: 2021-08-12 | Discharge: 2021-08-29 | DRG: 945 | Disposition: A | Discharge status: Home Health | Payer: Medicare PPO | Source: Other Acute Inpatient Hospital | Attending: Physical Medicine and Rehabilitation | Admitting: Physical Medicine and Rehabilitation

## 2021-08-12 ENCOUNTER — Encounter (HOSPITAL_COMMUNITY): Payer: Self-pay | Admitting: Physical Medicine and Rehabilitation

## 2021-08-12 ENCOUNTER — Other Ambulatory Visit: Payer: Self-pay

## 2021-08-12 DIAGNOSIS — I480 Paroxysmal atrial fibrillation: Secondary | ICD-10-CM | POA: Diagnosis present

## 2021-08-12 DIAGNOSIS — Z7951 Long term (current) use of inhaled steroids: Secondary | ICD-10-CM

## 2021-08-12 DIAGNOSIS — J441 Chronic obstructive pulmonary disease with (acute) exacerbation: Secondary | ICD-10-CM

## 2021-08-12 DIAGNOSIS — Z803 Family history of malignant neoplasm of breast: Secondary | ICD-10-CM

## 2021-08-12 DIAGNOSIS — F419 Anxiety disorder, unspecified: Secondary | ICD-10-CM | POA: Diagnosis present

## 2021-08-12 DIAGNOSIS — R5381 Other malaise: Secondary | ICD-10-CM | POA: Diagnosis not present

## 2021-08-12 DIAGNOSIS — M5416 Radiculopathy, lumbar region: Secondary | ICD-10-CM | POA: Diagnosis present

## 2021-08-12 DIAGNOSIS — G4733 Obstructive sleep apnea (adult) (pediatric): Secondary | ICD-10-CM | POA: Diagnosis present

## 2021-08-12 DIAGNOSIS — M549 Dorsalgia, unspecified: Secondary | ICD-10-CM | POA: Diagnosis not present

## 2021-08-12 DIAGNOSIS — C787 Secondary malignant neoplasm of liver and intrahepatic bile duct: Secondary | ICD-10-CM | POA: Diagnosis present

## 2021-08-12 DIAGNOSIS — T50905A Adverse effect of unspecified drugs, medicaments and biological substances, initial encounter: Secondary | ICD-10-CM

## 2021-08-12 DIAGNOSIS — J020 Streptococcal pharyngitis: Secondary | ICD-10-CM

## 2021-08-12 DIAGNOSIS — R0602 Shortness of breath: Secondary | ICD-10-CM | POA: Diagnosis not present

## 2021-08-12 DIAGNOSIS — Z981 Arthrodesis status: Secondary | ICD-10-CM

## 2021-08-12 DIAGNOSIS — Z8673 Personal history of transient ischemic attack (TIA), and cerebral infarction without residual deficits: Secondary | ICD-10-CM

## 2021-08-12 DIAGNOSIS — Z9981 Dependence on supplemental oxygen: Secondary | ICD-10-CM | POA: Diagnosis not present

## 2021-08-12 DIAGNOSIS — Z880 Allergy status to penicillin: Secondary | ICD-10-CM

## 2021-08-12 DIAGNOSIS — Z8249 Family history of ischemic heart disease and other diseases of the circulatory system: Secondary | ICD-10-CM

## 2021-08-12 DIAGNOSIS — Z801 Family history of malignant neoplasm of trachea, bronchus and lung: Secondary | ICD-10-CM

## 2021-08-12 DIAGNOSIS — G20 Parkinson's disease: Secondary | ICD-10-CM | POA: Diagnosis present

## 2021-08-12 DIAGNOSIS — Z96652 Presence of left artificial knee joint: Secondary | ICD-10-CM | POA: Diagnosis present

## 2021-08-12 DIAGNOSIS — G8929 Other chronic pain: Secondary | ICD-10-CM | POA: Diagnosis not present

## 2021-08-12 DIAGNOSIS — Z885 Allergy status to narcotic agent status: Secondary | ICD-10-CM

## 2021-08-12 DIAGNOSIS — L03116 Cellulitis of left lower limb: Secondary | ICD-10-CM | POA: Diagnosis present

## 2021-08-12 DIAGNOSIS — D649 Anemia, unspecified: Secondary | ICD-10-CM | POA: Diagnosis not present

## 2021-08-12 DIAGNOSIS — Z888 Allergy status to other drugs, medicaments and biological substances status: Secondary | ICD-10-CM

## 2021-08-12 DIAGNOSIS — I517 Cardiomegaly: Secondary | ICD-10-CM | POA: Diagnosis not present

## 2021-08-12 DIAGNOSIS — J90 Pleural effusion, not elsewhere classified: Secondary | ICD-10-CM | POA: Diagnosis not present

## 2021-08-12 DIAGNOSIS — Z23 Encounter for immunization: Secondary | ICD-10-CM | POA: Diagnosis not present

## 2021-08-12 DIAGNOSIS — J9611 Chronic respiratory failure with hypoxia: Secondary | ICD-10-CM | POA: Diagnosis not present

## 2021-08-12 DIAGNOSIS — I2510 Atherosclerotic heart disease of native coronary artery without angina pectoris: Secondary | ICD-10-CM | POA: Diagnosis present

## 2021-08-12 DIAGNOSIS — R55 Syncope and collapse: Secondary | ICD-10-CM | POA: Diagnosis not present

## 2021-08-12 DIAGNOSIS — Z8546 Personal history of malignant neoplasm of prostate: Secondary | ICD-10-CM

## 2021-08-12 DIAGNOSIS — Z7901 Long term (current) use of anticoagulants: Secondary | ICD-10-CM

## 2021-08-12 DIAGNOSIS — I10 Essential (primary) hypertension: Secondary | ICD-10-CM | POA: Diagnosis present

## 2021-08-12 DIAGNOSIS — Z87891 Personal history of nicotine dependence: Secondary | ICD-10-CM | POA: Diagnosis not present

## 2021-08-12 DIAGNOSIS — F4323 Adjustment disorder with mixed anxiety and depressed mood: Secondary | ICD-10-CM

## 2021-08-12 DIAGNOSIS — M159 Polyosteoarthritis, unspecified: Secondary | ICD-10-CM | POA: Diagnosis present

## 2021-08-12 DIAGNOSIS — Z79899 Other long term (current) drug therapy: Secondary | ICD-10-CM

## 2021-08-12 DIAGNOSIS — C3492 Malignant neoplasm of unspecified part of left bronchus or lung: Secondary | ICD-10-CM | POA: Diagnosis present

## 2021-08-12 DIAGNOSIS — C3490 Malignant neoplasm of unspecified part of unspecified bronchus or lung: Secondary | ICD-10-CM | POA: Diagnosis present

## 2021-08-12 DIAGNOSIS — Z821 Family history of blindness and visual loss: Secondary | ICD-10-CM

## 2021-08-12 DIAGNOSIS — Z820 Family history of epilepsy and other diseases of the nervous system: Secondary | ICD-10-CM

## 2021-08-12 DIAGNOSIS — Z7952 Long term (current) use of systemic steroids: Secondary | ICD-10-CM

## 2021-08-12 DIAGNOSIS — C3412 Malignant neoplasm of upper lobe, left bronchus or lung: Secondary | ICD-10-CM | POA: Diagnosis not present

## 2021-08-12 DIAGNOSIS — J189 Pneumonia, unspecified organism: Secondary | ICD-10-CM | POA: Diagnosis not present

## 2021-08-12 DIAGNOSIS — Z955 Presence of coronary angioplasty implant and graft: Secondary | ICD-10-CM

## 2021-08-12 DIAGNOSIS — D6481 Anemia due to antineoplastic chemotherapy: Secondary | ICD-10-CM | POA: Diagnosis present

## 2021-08-12 DIAGNOSIS — E785 Hyperlipidemia, unspecified: Secondary | ICD-10-CM | POA: Diagnosis present

## 2021-08-12 DIAGNOSIS — Z8042 Family history of malignant neoplasm of prostate: Secondary | ICD-10-CM

## 2021-08-12 DIAGNOSIS — T451X5A Adverse effect of antineoplastic and immunosuppressive drugs, initial encounter: Secondary | ICD-10-CM | POA: Diagnosis present

## 2021-08-12 DIAGNOSIS — K219 Gastro-esophageal reflux disease without esophagitis: Secondary | ICD-10-CM | POA: Diagnosis present

## 2021-08-12 DIAGNOSIS — J9601 Acute respiratory failure with hypoxia: Secondary | ICD-10-CM | POA: Diagnosis not present

## 2021-08-12 LAB — COMPREHENSIVE METABOLIC PANEL
ALT: 11 U/L (ref 0–44)
AST: 27 U/L (ref 15–41)
Albumin: 2.4 g/dL — ABNORMAL LOW (ref 3.5–5.0)
Alkaline Phosphatase: 64 U/L (ref 38–126)
Anion gap: 7 (ref 5–15)
BUN: 23 mg/dL (ref 8–23)
CO2: 26 mmol/L (ref 22–32)
Calcium: 8.7 mg/dL — ABNORMAL LOW (ref 8.9–10.3)
Chloride: 103 mmol/L (ref 98–111)
Creatinine, Ser: 0.72 mg/dL (ref 0.61–1.24)
GFR, Estimated: >60 mL/min (ref >60)
Glucose, Bld: 100 mg/dL — ABNORMAL HIGH (ref 70–99)
Potassium: 3.5 mmol/L (ref 3.5–5.1)
Sodium: 136 mmol/L (ref 135–145)
Total Bilirubin: 0.6 mg/dL (ref 0.3–1.2)
Total Protein: 6.3 g/dL — ABNORMAL LOW (ref 6.5–8.1)

## 2021-08-12 LAB — CBC
HCT: 33.7 % — ABNORMAL LOW (ref 39.0–52.0)
Hemoglobin: 10.8 g/dL — ABNORMAL LOW (ref 13.0–17.0)
MCH: 31.8 pg (ref 26.0–34.0)
MCHC: 32 g/dL (ref 30.0–36.0)
MCV: 99.1 fL (ref 80.0–100.0)
Platelets: 319 10*3/uL (ref 150–400)
RBC: 3.4 MIL/uL — ABNORMAL LOW (ref 4.22–5.81)
RDW: 19.5 % — ABNORMAL HIGH (ref 11.5–15.5)
WBC: 8.3 10*3/uL (ref 4.0–10.5)
nRBC: 0 % (ref 0.0–0.2)

## 2021-08-12 LAB — QUANTIFERON-TB GOLD PLUS

## 2021-08-12 MED ORDER — IPRATROPIUM BROMIDE 0.06 % NA SOLN
1.0000 | Freq: Two times a day (BID) | NASAL | Status: DC
Start: 2021-08-12 — End: 2021-08-29
  Administered 2021-08-12 – 2021-08-13 (×3): 2 via NASAL
  Administered 2021-08-14 – 2021-08-17 (×4): 1 via NASAL
  Administered 2021-08-17: 2 via NASAL
  Administered 2021-08-18: 1 via NASAL
  Administered 2021-08-18 – 2021-08-19 (×2): 2 via NASAL
  Administered 2021-08-19: 1 via NASAL
  Administered 2021-08-20: 09:00:00 2 via NASAL
  Administered 2021-08-20 – 2021-08-21 (×2): 1 via NASAL
  Administered 2021-08-21: 2 via NASAL
  Administered 2021-08-22 – 2021-08-23 (×3): 1 via NASAL
  Administered 2021-08-23: 2 via NASAL
  Administered 2021-08-24 – 2021-08-25 (×3): 1 via NASAL
  Administered 2021-08-26 – 2021-08-29 (×4): 2 via NASAL
  Filled 2021-08-12: qty 15
  Filled 2021-08-12: qty 30

## 2021-08-12 MED ORDER — ACETAMINOPHEN 325 MG PO TABS
650.0000 mg | ORAL_TABLET | Freq: Four times a day (QID) | ORAL | Status: DC | PRN
  Administered 2021-08-15 – 2021-08-29 (×7): 650 mg via ORAL
  Filled 2021-08-12 (×7): qty 2

## 2021-08-12 MED ORDER — IPRATROPIUM BROMIDE 0.02 % IN SOLN
0.5000 mg | Freq: Two times a day (BID) | RESPIRATORY_TRACT | Status: DC
  Administered 2021-08-12 – 2021-08-26 (×27): 0.5 mg via RESPIRATORY_TRACT
  Filled 2021-08-12 (×35): qty 2.5

## 2021-08-12 MED ORDER — ADULT MULTIVITAMIN W/MINERALS CH
1.0000 | ORAL_TABLET | Freq: Every day | ORAL | Status: DC
  Administered 2021-08-13 – 2021-08-29 (×17): 1 via ORAL
  Filled 2021-08-12 (×17): qty 1

## 2021-08-12 MED ORDER — FOLIC ACID 1 MG PO TABS
1.0000 mg | ORAL_TABLET | Freq: Every day | ORAL | Status: DC
  Administered 2021-08-13 – 2021-08-29 (×17): 1 mg via ORAL
  Filled 2021-08-12 (×17): qty 1

## 2021-08-12 MED ORDER — BUDESONIDE 0.5 MG/2ML IN SUSP
0.5000 mg | Freq: Two times a day (BID) | RESPIRATORY_TRACT | Status: DC
  Administered 2021-08-12 – 2021-08-29 (×30): 0.5 mg via RESPIRATORY_TRACT
  Filled 2021-08-12 (×36): qty 2

## 2021-08-12 MED ORDER — RISAQUAD PO CAPS
1.0000 | ORAL_CAPSULE | Freq: Every day | ORAL | Status: DC
  Administered 2021-08-13 – 2021-08-29 (×17): 1 via ORAL
  Filled 2021-08-12 (×17): qty 1

## 2021-08-12 MED ORDER — POLYETHYLENE GLYCOL 3350 17 G PO PACK
17.0000 g | PACK | Freq: Every day | ORAL | Status: DC | PRN
  Administered 2021-08-23: 17 g via ORAL
  Filled 2021-08-12: qty 1

## 2021-08-12 MED ORDER — METOPROLOL SUCCINATE ER 25 MG PO TB24
25.0000 mg | ORAL_TABLET | Freq: Every day | ORAL | Status: DC
  Administered 2021-08-13 – 2021-08-29 (×17): 25 mg via ORAL
  Filled 2021-08-12 (×17): qty 1

## 2021-08-12 MED ORDER — HYDROCOD POLST-CPM POLST ER 10-8 MG/5ML PO SUER
5.0000 mL | Freq: Two times a day (BID) | ORAL | Status: DC | PRN
  Administered 2021-08-13 – 2021-08-27 (×8): 5 mL via ORAL
  Filled 2021-08-12 (×8): qty 5

## 2021-08-12 MED ORDER — ALBUTEROL SULFATE (2.5 MG/3ML) 0.083% IN NEBU
3.0000 mL | INHALATION_SOLUTION | RESPIRATORY_TRACT | Status: DC | PRN
  Administered 2021-08-20 – 2021-08-29 (×10): 3 mL via RESPIRATORY_TRACT
  Filled 2021-08-12 (×10): qty 3

## 2021-08-12 MED ORDER — PREDNISONE 20 MG PO TABS
100.0000 mg | ORAL_TABLET | Freq: Every day | ORAL | Status: AC
  Administered 2021-08-13 – 2021-08-17 (×5): 100 mg via ORAL
  Filled 2021-08-12 (×5): qty 5

## 2021-08-12 MED ORDER — CARBIDOPA-LEVODOPA 25-100 MG PO TABS
1.0000 | ORAL_TABLET | ORAL | Status: DC
  Administered 2021-08-12 – 2021-08-29 (×50): 1 via ORAL
  Filled 2021-08-12 (×50): qty 1

## 2021-08-12 MED ORDER — TAMSULOSIN HCL 0.4 MG PO CAPS
0.4000 mg | ORAL_CAPSULE | Freq: Every day | ORAL | Status: DC
  Administered 2021-08-12 – 2021-08-28 (×17): 0.4 mg via ORAL
  Filled 2021-08-12 (×17): qty 1

## 2021-08-12 MED ORDER — ALPRAZOLAM 0.5 MG PO TABS
0.5000 mg | ORAL_TABLET | Freq: Every evening | ORAL | Status: DC | PRN
  Administered 2021-08-12 – 2021-08-16 (×5): 0.5 mg via ORAL
  Filled 2021-08-12 (×5): qty 1

## 2021-08-12 MED ORDER — DOFETILIDE 500 MCG PO CAPS
500.0000 ug | ORAL_CAPSULE | Freq: Two times a day (BID) | ORAL | Status: DC
  Administered 2021-08-12 – 2021-08-17 (×11): 500 ug via ORAL
  Filled 2021-08-12 (×12): qty 1

## 2021-08-12 MED ORDER — BUDESONIDE 0.5 MG/2ML IN SUSP: 0.5000 mg | Freq: Two times a day (BID) | RESPIRATORY_TRACT | 12 refills | Status: DC

## 2021-08-12 MED ORDER — ACETAMINOPHEN 650 MG RE SUPP: 650.0000 mg | Freq: Four times a day (QID) | RECTAL | Status: DC | PRN

## 2021-08-12 MED ORDER — ATORVASTATIN CALCIUM 40 MG PO TABS
40.0000 mg | ORAL_TABLET | Freq: Every day | ORAL | Status: DC
  Administered 2021-08-13 – 2021-08-29 (×17): 40 mg via ORAL
  Filled 2021-08-12 (×17): qty 1

## 2021-08-12 MED ORDER — PANTOPRAZOLE SODIUM 40 MG PO TBEC
40.0000 mg | DELAYED_RELEASE_TABLET | Freq: Every day | ORAL | Status: DC
  Administered 2021-08-13 – 2021-08-29 (×17): 40 mg via ORAL
  Filled 2021-08-12 (×17): qty 1

## 2021-08-12 MED ORDER — ARFORMOTEROL TARTRATE 15 MCG/2ML IN NEBU
15.0000 ug | INHALATION_SOLUTION | Freq: Two times a day (BID) | RESPIRATORY_TRACT | Status: DC
  Administered 2021-08-12 – 2021-08-29 (×30): 15 ug via RESPIRATORY_TRACT
  Filled 2021-08-12 (×36): qty 2

## 2021-08-12 MED ORDER — PREDNISONE 50 MG PO TABS: 100.0000 mg | ORAL_TABLET | Freq: Every day | ORAL | Status: DC

## 2021-08-12 MED ORDER — TRAZODONE HCL 50 MG PO TABS
25.0000 mg | ORAL_TABLET | Freq: Every evening | ORAL | Status: DC | PRN
  Filled 2021-08-12: qty 1

## 2021-08-12 MED ORDER — BENZONATATE 100 MG PO CAPS
200.0000 mg | ORAL_CAPSULE | Freq: Three times a day (TID) | ORAL | Status: DC | PRN
  Administered 2021-08-12 – 2021-08-15 (×4): 200 mg via ORAL
  Filled 2021-08-12 (×4): qty 2

## 2021-08-12 MED ORDER — ALBUTEROL SULFATE (5 MG/ML) 0.5% IN NEBU: 2.5000 mg | INHALATION_SOLUTION | RESPIRATORY_TRACT | Status: DC | PRN

## 2021-08-12 MED ORDER — DAPSONE 100 MG PO TABS
100.0000 mg | ORAL_TABLET | Freq: Every day | ORAL | Status: DC
  Administered 2021-08-13 – 2021-08-29 (×17): 100 mg via ORAL
  Filled 2021-08-12 (×17): qty 1

## 2021-08-12 MED ORDER — LORATADINE 10 MG PO TABS: 10.0000 mg | ORAL_TABLET | Freq: Every day | ORAL | Status: DC | PRN

## 2021-08-12 MED ORDER — LIP MEDEX EX OINT
TOPICAL_OINTMENT | CUTANEOUS | Status: DC | PRN
  Filled 2021-08-12: qty 7

## 2021-08-12 MED ORDER — ONDANSETRON HCL 4 MG/2ML IJ SOLN: 4.0000 mg | Freq: Four times a day (QID) | INTRAMUSCULAR | Status: DC | PRN

## 2021-08-12 MED ORDER — POTASSIUM CHLORIDE CRYS ER 20 MEQ PO TBCR: 40.0000 meq | EXTENDED_RELEASE_TABLET | Freq: Once | ORAL | Status: DC

## 2021-08-12 MED ORDER — TOPIRAMATE 100 MG PO TABS
100.0000 mg | ORAL_TABLET | Freq: Two times a day (BID) | ORAL | Status: DC
  Administered 2021-08-12 – 2021-08-29 (×34): 100 mg via ORAL
  Filled 2021-08-12 (×10): qty 4
  Filled 2021-08-12: qty 1
  Filled 2021-08-12 (×2): qty 4
  Filled 2021-08-12 (×7): qty 1
  Filled 2021-08-12 (×3): qty 4
  Filled 2021-08-12: qty 1
  Filled 2021-08-12: qty 4
  Filled 2021-08-12 (×2): qty 1
  Filled 2021-08-12: qty 4
  Filled 2021-08-12 (×2): qty 1
  Filled 2021-08-12: qty 4
  Filled 2021-08-12: qty 1
  Filled 2021-08-12: qty 4
  Filled 2021-08-12 (×2): qty 1

## 2021-08-12 MED ORDER — ONDANSETRON HCL 4 MG PO TABS
4.0000 mg | ORAL_TABLET | Freq: Four times a day (QID) | ORAL | Status: DC | PRN
  Administered 2021-08-14 – 2021-08-23 (×4): 4 mg via ORAL
  Filled 2021-08-12 (×10): qty 1

## 2021-08-12 MED ORDER — TRAMADOL HCL 50 MG PO TABS: 50.0000 mg | ORAL_TABLET | Freq: Four times a day (QID) | ORAL | Status: DC | PRN

## 2021-08-12 MED ORDER — MAGNESIUM OXIDE -MG SUPPLEMENT 400 (240 MG) MG PO TABS
400.0000 mg | ORAL_TABLET | Freq: Every day | ORAL | Status: DC
  Administered 2021-08-13 – 2021-08-29 (×17): 400 mg via ORAL
  Filled 2021-08-12 (×17): qty 1

## 2021-08-12 MED ORDER — RIVAROXABAN 20 MG PO TABS
20.0000 mg | ORAL_TABLET | Freq: Every day | ORAL | Status: DC
  Administered 2021-08-12 – 2021-08-28 (×17): 20 mg via ORAL
  Filled 2021-08-12 (×17): qty 1

## 2021-08-12 MED ORDER — ALBUTEROL SULFATE (2.5 MG/3ML) 0.083% IN NEBU: 3.0000 mL | INHALATION_SOLUTION | RESPIRATORY_TRACT | 12 refills | Status: DC | PRN

## 2021-08-12 MED ORDER — FLUTICASONE PROPIONATE 50 MCG/ACT NA SUSP
2.0000 | Freq: Every day | NASAL | Status: DC
  Administered 2021-08-13 – 2021-08-29 (×15): 2 via NASAL

## 2021-08-12 MED ORDER — LEVALBUTEROL HCL 0.63 MG/3ML IN NEBU: 0.6300 mg | INHALATION_SOLUTION | Freq: Two times a day (BID) | RESPIRATORY_TRACT | 12 refills | Status: DC

## 2021-08-12 MED ORDER — IPRATROPIUM BROMIDE 0.02 % IN SOLN: 0.5000 mg | Freq: Two times a day (BID) | RESPIRATORY_TRACT | 12 refills | Status: DC

## 2021-08-12 MED ORDER — DAPSONE 100 MG PO TABS: 100.0000 mg | ORAL_TABLET | Freq: Every day | ORAL | Status: DC

## 2021-08-12 MED ORDER — FENOFIBRATE 160 MG PO TABS
160.0000 mg | ORAL_TABLET | Freq: Every day | ORAL | Status: DC
  Administered 2021-08-13 – 2021-08-29 (×17): 160 mg via ORAL
  Filled 2021-08-12 (×17): qty 1

## 2021-08-12 MED ORDER — GUAIFENESIN ER 600 MG PO TB12
1200.0000 mg | ORAL_TABLET | Freq: Two times a day (BID) | ORAL | Status: DC
  Administered 2021-08-12 – 2021-08-29 (×34): 1200 mg via ORAL
  Filled 2021-08-12 (×35): qty 2

## 2021-08-12 MED ORDER — GUAIFENESIN ER 600 MG PO TB12: 1200.0000 mg | ORAL_TABLET | Freq: Two times a day (BID) | ORAL | Status: AC

## 2021-08-12 MED ORDER — POTASSIUM CHLORIDE CRYS ER 10 MEQ PO TBCR
10.0000 meq | EXTENDED_RELEASE_TABLET | Freq: Every day | ORAL | Status: DC
  Administered 2021-08-13 – 2021-08-29 (×17): 10 meq via ORAL
  Filled 2021-08-12 (×17): qty 1

## 2021-08-12 NOTE — Progress Notes (Signed)
PMR Admission Coordinator Pre-Admission Assessment   Patient: Corey [name] is an 75 y.o., male MRN: [id number] DOB: 17-Jan-1946 Height: 6' 1.5" (186.7 cm) Weight: 102.8 kg   Insurance Information HMO:     PPO: yes     PCP:      IPA:      80/20: yes     OTHER:  PRIMARY: [employer] Medicare       Policy#: [id number]      Subscriber: Pt.  CM Name: Fraser Din      Phone#: [phone]     Fax#: [phone] I received a call from [place] at [facility] on 08/11/21 granting auth for CIR from 08/11/21 for 7 days  with 5 days to admit.  Pre-Cert#: [id number]      Employer: na Benefits:  Phone #:      Name:  Irene Shipper Date: 10/05/2019- still active Deductible: does not have OOP Max: $3,300 ($3,300 met) CIR: $125/day co-pay with a max co-pay of $1,250/admission (10 days) SNF: 100% coverage Outpatient:  $20/visit co-pay Home Health:  100% coverage DME: 80% coverage; 20% co-insurance Providers: in network  SECONDARY:       Policy#:      Phone#:    Development worker, community:       Phone#:    The Engineer, petroleum" for patients in Inpatient Rehabilitation Facilities with attached "Privacy Act [place] Records" was provided and verbally reviewed with: Patient and Family   Emergency Contact Information Contact Information       Name Relation Home Work Corey [name] [phone] [place] Son [phone]   [phone]    [place] Daughter [phone]   [phone]           Current Medical History  Patient Admitting Diagnosis: Debility, Cellulitis, Pneumonia History of Present Illness: Corey [name] is a 75 y.o. male with medical history significant of NSCLC on chemo, OSA, atrial fib, CAD. Presenting with LLE swelling and generalized weakness. He reports that over the last week he has noticed erythema and swelling starting in his left ankle and progressing upwards. He thought at first it was just dry,  irritated skin. So he tried some lotion, but it didn't help. He hasn't had any drainage in the area. He hasn't noticed fever. He's noticed that he's been progressively weaker during this time. He notes that he was recently on lvq for a PNA. His regimen stopped the day before his erythema started. In addition, his cough and shortness of breath from his PNA returned. He became concerned when his symptoms didn't improve yesterday. So, he came to the ED for help. CXR showed an unchanged LLL opacity. No fevers recored. WBC normal. Noted to have LLLE cellulitis. Margins marked. He was started on vanc, cefepime. Pt. With anemia during admission, received PRBCs 08/02/21. CIR consulted to assist return to PLOF.      Patient's medical record from [facility] has been reviewed by the rehabilitation admission coordinator and physician.   Past Medical History      Past Medical History:  Diagnosis Date   Anemia     Anxiety     Arthritis      "back, right knee, hands, ankles, neck" (05/31/2016)   Carotid artery disease ([place]) 08/30/2016    Carotid US 11/19: R 1-39, L 40-59 // Carotid US 08/2019: R 1-39; L 40-59 // Carotid US 11/21: R 1-39; L 40-59>> repeat 1 year  Chronic lower back pain     Coronary atherosclerosis of native coronary artery      a. BMS to [facility] 2004 and 2007, otherwise mild nonobstructive disease. EF normal.   Diverticulitis     Dyslipidemia     Dyspnea     Essential hypertension, benign     Family history of breast cancer     Family history of lung cancer     Family history of prostate cancer     GERD (gastroesophageal reflux disease)     Lumbar radiculopathy, chronic 02/04/2015    Right L5   Obesity     OSA on CPAP      uses cpap   Paroxysmal atrial fibrillation ([place])      a. Discovered after stroke.   Pneumonia 01/1996   PONV (postoperative nausea and vomiting)     Prostate CA ([place])     Recurrent upper respiratory infection (URI)     Stroke ([place]) 07/2012     no deficits   Visit for monitoring Tikosyn therapy 09/18/2019      Has the patient had major surgery during 100 days prior to admission? No   Family History   family history includes Blindness in his son; Breast cancer in his paternal grandmother; Heart attack in his father and paternal grandfather; Heart disease in his father; Hypertension in his mother; Lung cancer in his paternal aunt; Parkinson's disease in his brother; Prostate cancer (age of onset: 66) in his brother.   Current Medications   Current Facility-Administered Medications:    acetaminophen (TYLENOL) tablet 650 mg, 650 mg, Oral, Q6H PRN, 650 mg at 07/29/21 0606 **OR** acetaminophen (TYLENOL) suppository 650 mg, 650 mg, Rectal, Q6H PRN, Marylyn Ishihara, Tyrone A, DO   acetaminophen (TYLENOL) tablet 1,000 mg, 1,000 mg, Oral, Q8H PRN, Marylyn Ishihara, Tyrone A, DO   acidophilus (RISAQUAD) capsule, , Oral, Daily, Kyle, Tyrone A, DO, 1 capsule at 08/03/21 1030   albuterol (PROVENTIL) (2.5 MG/3ML) 0.083% nebulizer solution 3 mL, 3 mL, Inhalation, Q4H PRN, Marylyn Ishihara, Tyrone A, DO   ALPRAZolam Duanne Moron) tablet 0.5 mg, 0.5 mg, Oral, QHS PRN, Marylyn Ishihara, Tyrone A, DO, 0.5 mg at 08/02/21 2127   alum & mag hydroxide-simeth (MAALOX/MYLANTA) 200-200-20 MG/5ML suspension 30 mL, 30 mL, Oral, Q4H PRN, Eugenie Filler, MD, 30 mL at 07/31/21 0930   arformoterol (BROVANA) nebulizer solution 15 mcg, 15 mcg, Nebulization, BID, 15 mcg at 08/03/21 0743 **AND** [DISCONTINUED] umeclidinium bromide (INCRUSE ELLIPTA) 62.5 MCG/ACT 1 puff, 1 puff, Inhalation, Daily, Kyle, Tyrone A, DO, 1 puff at 07/31/21 0735   atorvastatin (LIPITOR) tablet 40 mg, 40 mg, Oral, Daily, Kyle, Tyrone A, DO, 40 mg at 08/03/21 1032   benzonatate (TESSALON) capsule 200 mg, 200 mg, Oral, TID PRN, Marylyn Ishihara, Tyrone A, DO, 200 mg at 08/01/21 0850   budesonide (PULMICORT) nebulizer solution 0.5 mg, 0.5 mg, Nebulization, BID, Eugenie Filler, MD, 0.5 mg at 08/03/21 0762   carbidopa-levodopa (SINEMET IR) 25-100 MG per  tablet immediate release 1 tablet, 1 tablet, Oral, 3 times per day, Marylyn Ishihara, Tyrone A, DO, 1 tablet at 08/03/21 1253   cefTRIAXone (ROCEPHIN) 2 g in sodium chloride 0.9 % 100 mL IVPB, 2 g, Intravenous, Q24H, Kyle, Tyrone A, DO, Last Rate: 200 mL/hr at 08/02/21 1014, 2 g at 08/02/21 1014   chlorhexidine (PERIDEX) 0.12 % solution 15 mL, 15 mL, Mouth Rinse, BID, Kyle, Tyrone A, DO, 15 mL at 08/03/21 0950   Chlorhexidine Gluconate Cloth 2 % PADS 6 each, 6 each, Topical, Daily, Marylyn Ishihara,  Tyrone A, DO, 6 each at 08/03/21 0950   chlorpheniramine-HYDROcodone (TUSSIONEX) 10-8 MG/5ML suspension 5 mL, 5 mL, Oral, Q12H PRN, Marylyn Ishihara, Tyrone A, DO, 5 mL at 08/01/21 0148   dofetilide (TIKOSYN) capsule 500 mcg, 500 mcg, Oral, BID, Kyle, Tyrone A, DO, 500 mcg at 08/03/21 1030   fenofibrate tablet 160 mg, 160 mg, Oral, Daily, Kyle, Tyrone A, DO, 160 mg at 08/03/21 1029   fluconazole (DIFLUCAN) tablet 200 mg, 200 mg, Oral, Daily, Bell, Michelle T, RPH, 200 mg at 08/03/21 1031   fluticasone (FLONASE) 50 MCG/ACT nasal spray 2 spray, 2 spray, Each Nare, Daily, Kyle, Tyrone A, DO, 2 spray at 38/46/65 9935   folic acid (FOLVITE) tablet 1 mg, 1 mg, Oral, Daily, Bell, Michelle T, RPH, 1 mg at 08/03/21 1029   guaiFENesin (MUCINEX) 12 hr tablet 1,200 mg, 1,200 mg, Oral, BID, Eugenie Filler, MD, 1,200 mg at 08/03/21 1031   HYDROcodone-acetaminophen (NORCO/VICODIN) 5-325 MG per tablet 1-2 tablet, 1-2 tablet, Oral, Q6H PRN, Marylyn Ishihara, Tyrone A, DO, 1 tablet at 07/28/21 2254   ipratropium (ATROVENT) 0.03 % nasal spray 1-2 spray, 1-2 spray, Each Nare, Q12H, Eugenie Filler, MD, 1 spray at 08/03/21 1042   ipratropium (ATROVENT) nebulizer solution 0.5 mg, 0.5 mg, Nebulization, TID, Curt Bears, MD, 0.5 mg at 08/03/21 7017   levalbuterol (XOPENEX) nebulizer solution 0.63 mg, 0.63 mg, Nebulization, TID, Curt Bears, MD, 0.63 mg at 08/03/21 7939   loperamide (IMODIUM) capsule 2 mg, 2 mg, Oral, Daily PRN, Eugenie Filler, MD    loratadine (CLARITIN) tablet 10 mg, 10 mg, Oral, Daily PRN, Eudelia Bunch, RPH, 10 mg at 08/01/21 0850   magnesium oxide (MAG-OX) tablet 400 mg, 400 mg, Oral, Daily, Eugenie Filler, MD, 400 mg at 08/03/21 1032   [COMPLETED] magnesium sulfate IVPB 4 g 100 mL, 4 g, Intravenous, Once, Last Rate: 50 mL/hr at 08/03/21 1047, 4 g at 08/03/21 1047 **FOLLOWED BY** magnesium sulfate IVPB 2 g 50 mL, 2 g, Intravenous, Once, Eugenie Filler, MD, Last Rate: 50 mL/hr at 08/03/21 1256, 2 g at 08/03/21 1256   MEDLINE mouth rinse, 15 mL, Mouth Rinse, q12n4p, Kyle, Tyrone A, DO, 15 mL at 08/03/21 1218   metoprolol succinate (TOPROL-XL) 24 hr tablet 25 mg, 25 mg, Oral, Daily, Eugenie Filler, MD, 25 mg at 08/03/21 1032   metroNIDAZOLE (FLAGYL) IVPB 500 mg, 500 mg, Intravenous, Q12H, Eugenie Filler, MD, Last Rate: 100 mL/hr at 08/03/21 1048, 500 mg at 08/03/21 1048   multivitamin with minerals tablet 1 tablet, 1 tablet, Oral, Daily, Kyle, Tyrone A, DO, 1 tablet at 08/03/21 1033   ondansetron (ZOFRAN) tablet 4 mg, 4 mg, Oral, Q6H PRN **OR** ondansetron (ZOFRAN) injection 4 mg, 4 mg, Intravenous, Q6H PRN, Marylyn Ishihara, Tyrone A, DO, 4 mg at 07/30/21 1401   pantoprazole (PROTONIX) EC tablet 40 mg, 40 mg, Oral, Daily, Bell, Michelle T, RPH, 40 mg at 08/03/21 1032   potassium chloride (KLOR-CON) CR tablet 10 mEq, 10 mEq, Oral, Daily, Eugenie Filler, MD   prochlorperazine (COMPAZINE) injection 10 mg, 10 mg, Intravenous, Q6H PRN, Eugenie Filler, MD, 10 mg at 07/30/21 0914   promethazine (PHENERGAN) tablet 25 mg, 25 mg, Oral, Q6H PRN, Marylyn Ishihara, Tyrone A, DO, 25 mg at 07/29/21 1837   rivaroxaban (XARELTO) tablet 20 mg, 20 mg, Oral, Q supper, Eugenie Filler, MD, 20 mg at 08/02/21 1617   sodium chloride flush (NS) 0.9 % injection 10-40 mL, 10-40 mL, Intracatheter, PRN, Marylyn Ishihara, Tyrone A,  DO   tamsulosin (FLOMAX) capsule 0.4 mg, 0.4 mg, Oral, QPC supper, Marylyn Ishihara, Tyrone A, DO, 0.4 mg at 08/02/21 1617   topiramate (TOPAMAX)  tablet 100 mg, 100 mg, Oral, BID, Kyle, Tyrone A, DO, 100 mg at 08/03/21 1030   traMADol (ULTRAM) tablet 50 mg, 50 mg, Oral, Q6H PRN, Marylyn Ishihara, Tyrone A, DO   traZODone (DESYREL) tablet 25 mg, 25 mg, Oral, QHS PRN, Marylyn Ishihara, Tyrone A, DO, 25 mg at 08/02/21 2128   Patients Current Diet:  Diet Order                  DIET DYS 3 Room service appropriate? Yes; Fluid consistency: Thin  Diet effective now                         Precautions / Restrictions Precautions Precautions: Fall Precaution Comments: CHEMO Restrictions Weight Bearing Restrictions: No    Has the patient had 2 or more falls or a fall with injury in the past year? No   Prior Activity Level Limited Community (1-2x/wk): Pt. wenet out for appointments   Prior Functional Level Self Care: Did the patient need help bathing, dressing, using the toilet or eating? Needed some help   Indoor Mobility: Did the patient need assistance with walking from room to room (with or without device)? Needed some help   Stairs: Did the patient need assistance with internal or external stairs (with or without device)? Needed some help   Functional Cognition: Did the patient need help planning regular tasks such as shopping or remembering to take medications? Needed some help   Patient Information Are you of Hispanic, Latino/a,or Spanish origin?: A. No, not of Hispanic, Latino/a, or Spanish origin What is your race?: A. White Do you need or want an interpreter to communicate with a doctor or health care staff?: 0. No   Patient's Response To:  Health Literacy and Transportation Is the patient able to respond to health literacy and transportation needs?: Yes Health Literacy - How often do you need to have someone help you when you read instructions, pamphlets, or other written material from your doctor or pharmacy?: Sometimes In the past 12 months, has lack of transportation kept you from medical appointments or from getting medications?:  No In the past 12 months, has lack of transportation kept you from meetings, work, or from getting things needed for daily living?: No   Development worker, international aid / [place] Devices/Equipment: Eyeglasses, CPAP, Environmental consultant (specify type), Other (Comment) (four wheel walker, rollator, transfer chair) Home Equipment: Rollator (4 wheels), Cane - single point, Shower seat, Conservation officer, nature (2 wheels), Hand held shower head, Transport chair, Adaptive equipment   Prior Device Use: Indicate devices/aids used by the patient prior to current illness, exacerbation or injury? Walker   Current Functional Level Cognition   Overall Cognitive Status: Within Functional Limits for tasks assessed Orientation Level: Oriented X4 General Comments: AxO x 3 very pleasant but feels real weak. brother was also in room on this date    Extremity Assessment (includes Sensation/Coordination)   Upper Extremity Assessment: Defer to OT evaluation  Lower Extremity Assessment: Generalized weakness     ADLs   Overall ADL's : Needs assistance/impaired Eating/Feeding: Modified independent, Sitting Eating/Feeding Details (indicate cue type and reason): Increased effort due to BUE tremor, but no assist needed Grooming: Wash/dry hands, Sitting, Set up, Oral care, Wash/dry face Grooming Details (indicate cue type and reason): patient was noted to cough up some  red/green colored phlem after brushing teeth. nurse was in room and to contact MD. patient was ntoed toh ave increaed SOB with coughing with O2 dropping to 82% on RA with patient having removed nasal cannula to wash face. O2 was returned with O2 rising to 93% with eduaction on deep breathing. Upper Body Bathing: Set up, Sitting Upper Body Bathing Details (indicate cue type and reason): patient declined to wash up stating he had done that previous night Lower Body Bathing: Minimal assistance, Sitting/lateral leans Upper Body Dressing : Set up, Sitting Lower Body  Dressing: With adaptive equipment, Minimal assistance, Sit to/from stand, Sitting/lateral leans Toilet Transfer: BSC, Rolling walker (2 wheels), Stand-pivot, Minimal assistance Toilet Transfer Details (indicate cue type and reason): Cues for hand placement. Increased effort. Toileting- Clothing Manipulation and Hygiene: Moderate assistance, Sit to/from stand, Sitting/lateral lean Toileting - Clothing Manipulation Details (indicate cue type and reason): Pt able to perform posterior peri care using orward lean on BSC. Required Mod Assist to completed thorough hygiene while standing and holding RW. Functional mobility during ADLs: Minimal assistance, Rolling walker (2 wheels) General ADL Comments: patient required max A x 2 to transfer from edge of bed to recliner with patient reporting increased dizziness at this time. nursing aware. patients HR was noted to elevate to 124 bpm with transfer. patients vitals seated in recliner were126/77 HR was 114.     Mobility   Overal bed mobility: Needs Assistance Bed Mobility: Supine to Sit Supine to sit: Max assist, Total assist Sit to supine: Mod assist, HOB elevated General bed mobility comments: pt required increased assist to transfer from supine to sidelying to EOB with HOB elevated and use of rail.  Pt < 10% self able due to profound weakness.     Transfers   Overall transfer level: Needs assistance Equipment used: Rolling walker (2 wheels), None Transfers: Sit to/from Stand, Stand Pivot Transfers Sit to Stand: Mod assist, +2 physical assistance, +2 safety/equipment Stand pivot transfers: Total assist, +2 physical assistance, +2 safety/equipment General transfer comment: pt required + 2 side by side assist with inablity to push self up so allowed himw to pull up using walker.  MAX c/o weakness and c/o increased dizziness.  BP standing 107/57 with HR 114 and sats decreased from 95% at rest to 83% remeining on 6 lts.  Pr performed 2 brief sit to stands.   Assisted to recliner.     Ambulation / Gait / Stairs / Wheelchair Mobility   Ambulation/Gait General Gait Details: not able to attempt due to limited activity tolerance during transfer     Posture / Balance Dynamic Sitting Balance Sitting balance - Comments: Tremulous Balance Overall balance assessment: Needs assistance Sitting balance-Leahy Scale: Fair Sitting balance - Comments: Tremulous Standing balance support: Bilateral upper extremity supported Standing balance-Leahy Scale: Poor Standing balance comment: Requires BUE support on RW     Special needs/care consideration Skin Cellulitis on LLE and Special service needs n/a    Previous Home Environment (from acute therapy documentation) Living Arrangements: Spouse/significant other  Lives With: Spouse Available Help at Discharge: Family, Available PRN/intermittently, Friend(s) (spouse works outside the home) Type of Home: [employer] Layout: Two level, Bed/bath upstairs, 1/2 bath on main level Alternate Level Stairs-Rails: Right, Left (B rails on first half of steps, then R rail only the rest of the way up) Alternate Level Stairs-Number of Steps: flight Home Access: Stairs to enter Entrance Stairs-Rails: None Entrance Stairs-Number of Steps: 2 Bathroom Shower/Tub: Chiropodist: Handicapped height Bathroom  Accessibility: No Home Care Services: Yes Type of Home Care Services: [place] (if known): [place] on hold too weak to do anything Additional Comments: Pt no longer with Life Alert system   Discharge Living Setting Plans for Discharge Living Setting: Patient's home Type of Home at Discharge: House Discharge Home Layout: Two level, 1/2 bath on main level Alternate Level Stairs-Rails: Right, Left Alternate Level Stairs-Number of Steps: flight Discharge Home Access: Stairs to enter Entrance Stairs-Rails: None Entrance Stairs-Number of Steps: 2 Discharge Bathroom Shower/Tub: Tub/shower  unit Discharge Bathroom Toilet: Handicapped height Discharge Bathroom Accessibility: Yes How Accessible: Accessible via walker Does the patient have any problems obtaining your medications?: No   Social/Family/Support Systems Patient Roles: Spouse Contact Information: [phone] Anticipated Caregiver: Zymeir Salminen Anticipated Caregiver's Contact Information: [phone] Ability/Limitations of Caregiver: Can provide min A Caregiver Availability: 24/7 Discharge Plan Discussed with Primary Caregiver: Yes Is Caregiver In Agreement with Plan?: Yes Does Caregiver/Family have Issues with Lodging/Transportation while Pt is in Rehab?: No   Goals Patient/Family Goal for Rehab: PT/OT MIn A Expected length of stay: 18-22 days Pt/Family Agrees to Admission and willing to participate: Yes Program Orientation Provided & Reviewed with Pt/Caregiver Including Roles  & Responsibilities: Yes   Decrease burden of Care through IP rehab admission: Decrease number of caregivers and Patient/family education   Possible need for SNF placement upon discharge: not anticipated    Patient Condition: I have reviewed medical records from [facility], spoken with CM, and patient and spouse. I discussed via phone for inpatient rehabilitation assessment.  Patient will benefit from ongoing PT and OT, can actively participate in 3 hours of therapy a day 5 days of the week, and can make measurable gains during the admission.  Patient will also benefit from the coordinated team approach during an Inpatient Acute Rehabilitation admission.  The patient will receive intensive therapy as well as Rehabilitation physician, nursing, social worker, and care management interventions.  Due to safety, skin/wound care, disease management, medication administration, pain management, and patient education the patient requires 24 hour a day rehabilitation nursing.  The patient is currently min g- mod +2 with mobility and  basic ADLs.  Discharge setting and therapy post discharge at home with home health is anticipated.  Patient has agreed to participate in the Acute Inpatient Rehabilitation Program and will admit today.   Preadmission Screen Completed By:  Genella Mech, 08/03/2021 1:54 PM ______________________________________________________________________   Discussed status with Dr. Ranell Patrick  on 08/12/21 at 67 and received approval for admission today.   Admission Coordinator:  Genella Mech, CCC-SLP, time 1030/Date 08/12/21    Assessment/Plan: Diagnosis: Debility secondary to left leg cellulitis Does the need for close, 24 hr/day Medical supervision in concert with the patient's rehab needs make it unreasonable for this patient to be served in a less intensive setting? Yes Co-Morbidities requiring supervision/potential complications: overweight BMI 27.60, chronic benign essential hypertension, GERD, paroxysmal atrial fibrilliation, nausea and vomiting Due to bladder management, bowel management, safety, skin/wound care, disease management, medication administration, pain management, and patient education, does the patient require 24 hr/day rehab nursing? Yes Does the patient require coordinated care of a physician, rehab nurse, PT, OT to address physical and functional deficits in the context of the above medical diagnosis(es)? Yes Addressing deficits in the following areas: balance, endurance, locomotion, strength, transferring, bowel/bladder control, bathing, dressing, feeding, grooming, toileting, and psychosocial support Can the patient actively participate in an intensive therapy program of at least 3 hrs  of therapy 5 days a week? Yes The potential for patient to make measurable gains while on inpatient rehab is excellent Anticipated functional outcomes upon discharge from inpatient rehab: supervision PT, supervision OT, independent SLP Estimated rehab length of stay to reach the above functional goals is:  14-16 days Anticipated discharge destination: Home 10. Overall Rehab/Functional Prognosis: excellent     MD Signature: Leeroy Cha, MD

## 2021-08-12 NOTE — TOC Transition Note (Signed)
Transition of Care [facility]) - CM/SW Discharge Note   Patient Details  Name: Corey [name] MRN: [id number] Date of Birth: [birth year]  [facility] of Care [facility]) CM/SW Contact:  Dessa Phi, RN Phone Number: 08/12/2021, 10:23 AM   Clinical Narrative: CIR has bed available per rep Laura,Nsg to manage [facility] transport. No further CM needs.    Final next level of care: IP Rehab Facility Barriers to Discharge: No Barriers Identified   Patient Goals and CMS Choice Patient states their goals for this hospitalization and ongoing recovery are:: to go home CMS Medicare.gov Compare Post Acute Care list provided to:: Patient    Discharge Placement                Patient to be transferred to facility by: [facility]-Nsg to manage Name of family member notified: Renee spouse [phone] Patient and family notified of of transfer: 08/12/21  Discharge Plan and Services   Discharge Planning Services: CM Consult                      HH Arranged: PT, OT, Social Work [employer] Agency: [place] Date [place]: 07/31/21 Time [place]: 0131 Representative spoke with at [place]: Cherl rose  Social Determinants of Health ([place]) Interventions     Readmission Risk Interventions Readmission Risk Prevention Plan 08/05/2021  Transportation Screening Complete  PCP or Specialist appointment within 3-5 days of discharge Complete  HRI or [place] Complete  SW Recovery Care/Counseling Consult Complete  [place] Not Applicable  Some recent data might be hidden

## 2021-08-12 NOTE — Progress Notes (Signed)
Patient arrived with spouse, settled into room and oriented to unit. Verbalized agreement with plan to call for assistance.

## 2021-08-12 NOTE — Discharge Summary (Signed)
Physician Discharge Summary  AMMAAR ENCINA XAJ:[id number] DOB: 07/24/1946 DOA: 07/28/2021  PCP: [name], MD  Admit date: 07/28/2021 Discharge date: 08/12/2021  Admitted From: home Disposition:  CIR  Recommendations for Outpatient Follow-up:  Patient has been referred to outpatient pulmonology (ILD) clinic for follow up He is to continue current high dose of prednisone until 11/15, after which he should be started on slow taper. Would follow up with Dr. [name] to further taper schedule Follow up with oncology for further management of lung CA.   Discharge Condition:stable CODE STATUS:DNR Diet recommendation: heart healthy, dysphagia 3 diet with thin liquids  Brief/Interim Summary: 75 year old gentleman history of non-small cell lung cancer on chemotherapy, OSA, PAF, coronary artery disease presented with left lower extremity swelling and generalized weakness.  Patient noted to have recently been treated with Levaquin for pneumonia which he completed the day prior to onset of erythema.  Patient presented with shortness of breath from pneumonia.  Chest x-ray done showed unchanged left lower lobe opacity, no fevers recorded, patient was also noted to have left lower extremity cellulitis.  Patient placed on IV vancomycin cefepime and admitted for further evaluation and management.  Patient noted the night of admission to have intractable nausea and vomiting and abdominal films and chest x-ray ordered.  Discharge Diagnoses:  Principal Problem:   Cellulitis of left leg Active Problems:   Benign essential HTN   Dyslipidemia   GERD (gastroesophageal reflux disease)   AF (paroxysmal atrial fibrillation) (HCC)   OSA on CPAP   Hyponatremia   Essential hypertension   Cerebral embolism with transient ischemic attack (TIA)   History of stroke   Non-small cell carcinoma of left lung, stage 4 ([place])   Primary cancer of left lower lobe of lung (HCC)   CAP (community acquired pneumonia)    Symptomatic anemia   Malaise   Hypokalemia   Hypomagnesemia   Nausea & vomiting   Cellulitis of left lower extremity   Acute respiratory failure with hypoxia ([place])  #1 left lower extremity cellulitis -Patient with notable improvement left lower extremity cellulitis. -Blood cultures with no growth to date.   -Cellulitis has been treated with a full course of antibiotics at this time.  2.  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 -Initially requiring as high as 8L high flow O2, he has since been weaned down to [place] 2L at rest - Candida albicans, viridans Streptococcus.. -Sed rate > 140.  -Appreciate input by PCCM as well as oncology.  Recommendation for PJP prophylaxis, now on dapsone. Patient remains on high-dose steroids for Keytruda pneumonitis per oncology recommendations. -Oncology recommends high dose steroids (prednisone 100mg  daily) x 2 weeks followed by gradual tapered dose of prednisone (begin tapering after 08/18/21) -Pt has completed course of abx per Pulm recs -started on dapsone for PJP prophylaxis -Continue scheduled nebs. -Continue with bipap nightly -PCCM and oncology had been following.  Pulmonary has since signed off  3.  Nausea vomiting -Patient noted to have intractable nausea vomiting the evening of 07/29/2021, morning of 07/30/2021 . -Patient currently tolerating dysphagia 3 diet, tolerating thus far, resolved -Seen by SLP and underwent mbs, currently on dysphagia 3 diet with thin liquids   4.  aspiration pneumonia/CAP versus BOOP/drug-induced pneumonitis.  -Patient noted with intractable nausea and vomiting the evening of 07/29/2021 and in the morning of 07/30/2021.  -Repeat chest  x-ray done 08/01/2021 with patchy AICD in the right lung corresponds to groundglass  opacities on CT, with mild interval progression.  Chronic volume loss in the left hemithorax with pleural parenchymal opacity in the left lower lung.  -MRSA PCR negative.   -Sputum gram stain and culture with rare Candida albicans, rare variant Streptococcus.    -Completed course of abx -On high dosed steroids as above -Weaning O2 down to 1-2LNC when at rest. When pt attempts ambulation or work with PT, noted to desaturate with markedly increased resp effort, requiring up to 5-6L [place] before gradually weaning back down  5.  Hypokalemia/hypomagnesemia/hyponatremia -resolved -Patient on Tikosyn and as such goal is to keep potassium around 4, magnesium at 2.   6.  Stage IV non-small cell lung cancer -On palliative chemotherapy per oncology, Dr. Curt Bears. -Continue nebs/inhalers. -Patient's oncologist ordered staging CT scans on 07/31/2021.   -Patient seen by oncology -Concerns for Keytruda induced pneumonitis and as such this will not be resumed on discharge per oncology. Pt is continued on steroids per Oncology as above  7.  Paroxysmal atrial fibrillation -Patient with intractable nausea vomiting unable to tolerate any oral intake early on during the hospitalization which has since resolved. -Patient transitioned from IV Lopressor to oral home dose Lopressor.  -Continue Tikosyn.   -Keep potassium approximately 4, keep magnesium approximately 2.   -Was on full dose Lovenox and transitioned back to home dose Xarelto, currently stable -Without evidence of acute blood loss  8.  Hyperlipidemia -Lipitor, fenofibrate.   9.  Coronary artery disease/hypertension -IV metoprolol has been transition back to home dose oral metoprolol.  Continue statin, anticoagulation.   10.  Parkinson's -continue on Sinemet  11.  Prolonged QT -Repeat EKG with resolution of QT prolongation currently at 467 from 530 on admission. -Would aim to keep potassium approximately at 4, magnesium approximately  2.  12.  OSA -Nausea and vomiting improved no further nausea and vomiting noted.   -Was on CPAP nightly however due to respiratory issues has been placed on BiPAP.  13.  History of CVA -No overt bleeding.  -Xarelto for secondary stroke prophylaxis due to history of PAF.   -Pt is continued on statin   14.  Generalized weakness -PT/OT following -Recs for CIR noted. TOC following  15.  Anemia -Likely secondary to chemotherapy. -Patient with no overt bleeding. 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)   Diet - low sodium heart healthy   Complete by: As directed    Increase activity slowly   Complete by: As directed       Allergies as of 08/12/2021       Reactions   Amoxicillin Swelling   Tolerates Rocephin   Fosinopril Other (See Comments)   Hydromorphone Nausea Only, Other (See Comments), Nausea And Vomiting   Ampicillin Rash   Monopril [fosinopril Sodium] Other (See Comments)   Muscle aches & pain   Other Other (See Comments)   Rosuvastatin Other (See Comments)   Joint pain        Medication List     STOP taking these medications    albuterol 108 (90 Base) MCG/ACT inhaler Commonly known as: VENTOLIN HFA Replaced by: albuterol (2.5 MG/3ML) 0.083% nebulizer solution   diclofenac Sodium 1 % Gel Commonly  known as: VOLTAREN       TAKE these medications    albuterol (2.5 MG/3ML) 0.083% nebulizer solution Commonly known as: PROVENTIL Inhale 3 mLs into the lungs every 4 (four) hours as needed for wheezing or shortness of breath. Replaces: albuterol 108 (90 Base) MCG/ACT inhaler   ALPRAZolam 0.5 MG tablet Commonly known as: XANAX Take 1 tablet (0.5 mg total) by mouth at bedtime as needed for anxiety.    atorvastatin 40 MG tablet Commonly known as: LIPITOR Take 1 tablet (40 mg total) by mouth daily.   benzonatate 200 MG capsule Commonly known as: TESSALON TAKE 1 CAPSULE (200 MG TOTAL) BY MOUTH 3 (THREE) TIMES DAILY AS NEEDED FOR COUGH.   budesonide 0.5 MG/2ML nebulizer solution Commonly known as: PULMICORT Take 2 mLs (0.5 mg total) by nebulization 2 (two) times daily.   carbidopa-levodopa 25-100 MG tablet Commonly known as: SINEMET IR Take 1 tablet by mouth 3 (three) times daily. What changed: additional instructions   Carbinoxamine Maleate 6 MG Tabs Take 1 tablet by mouth 2 (two) times daily as needed (drainage).   chlorpheniramine-HYDROcodone 10-8 MG/5ML Suer Commonly known as: TUSSIONEX Take 5 mLs by mouth every 12 (twelve) hours as needed for cough.   dapsone 100 MG tablet Take 1 tablet (100 mg total) by mouth daily.   dofetilide 500 MCG capsule Commonly known as: TIKOSYN Take 1 capsule (500 mcg total) by mouth 2 (two) times daily. Please keep upcoming appointment in February for future refills. Thank you   fenofibrate 160 MG tablet TAKE 1 TABLET DAILY. PLEASE KEEP UPCOMING APPT IN MARCH WITH DR. Acie Fredrickson BEFORE ANYMORE REFILLS. What changed:  how much to take how to take this when to take this additional instructions   fluticasone 50 MCG/ACT nasal spray Commonly known as: FLONASE Place 2 sprays into both nostrils daily.   folic acid 1 MG tablet Commonly known as: FOLVITE TAKE 1 TABLET BY MOUTH EVERY DAY   guaiFENesin 600 MG 12 hr tablet Commonly known as: MUCINEX Take 2 tablets (1,200 mg total) by mouth 2 (two) times daily.   ipratropium 0.02 % nebulizer solution Commonly known as: ATROVENT Take 2.5 mLs (0.5 mg total) by nebulization 2 (two) times daily.   ipratropium 0.03 % nasal spray Commonly known as: ATROVENT Place 1-2 sprays into both nostrils every 12 (twelve) hours. For drainage   levalbuterol 0.63 MG/3ML nebulizer solution Commonly known as:  XOPENEX Take 3 mLs (0.63 mg total) by nebulization 2 (two) times daily.   lidocaine-prilocaine cream Commonly known as: EMLA Apply 1 application topically as needed. What changed: reasons to take this   loperamide 2 MG capsule Commonly known as: IMODIUM Take 2 mg by mouth daily as needed for diarrhea or loose stools.   magnesium oxide 400 (240 Mg) MG tablet Commonly known as: MAG-OX TAKE 1 TABLET BY MOUTH EVERY DAY   metoprolol succinate 25 MG 24 hr tablet Commonly known as: TOPROL-XL TAKE 1 TABLET BY MOUTH EVERY DAY   multivitamins ther. w/minerals Tabs tablet Take 1 tablet by mouth daily.   nitroGLYCERIN 0.4 MG SL tablet Commonly known as: NITROSTAT Place 1 tablet (0.4 mg total) under the tongue every 5 (five) minutes as needed for chest pain.   omeprazole 20 MG capsule Commonly known as: PRILOSEC Take 1 capsule (20 mg total) by mouth daily.   ondansetron 8 MG tablet Commonly known as: ZOFRAN Take 1 tablet (8 mg total) by mouth every 8 (eight) hours as needed for nausea or vomiting. Starting 3  days after chemotherapy   potassium chloride 10 MEQ tablet Commonly known as: KLOR-CON TAKE 1 TABLET BY MOUTH EVERY DAY   predniSONE 50 MG tablet Commonly known as: DELTASONE Take 2 tablets (100 mg total) by mouth daily with breakfast.   Probiotic 250 MG Caps Take 250 mg by mouth daily.   promethazine 25 MG tablet Commonly known as: PHENERGAN Take 1 tablet (25 mg total) by mouth every 6 (six) hours as needed for nausea or vomiting.   solifenacin 5 MG tablet Commonly known as: VESICARE Take 5 mg by mouth at bedtime.   Stiolto Respimat 2.5-2.5 MCG/ACT Aers Generic drug: Tiotropium Bromide-Olodaterol Inhale 2 puffs into the lungs daily.   tamsulosin 0.4 MG Caps capsule Commonly known as: FLOMAX Take 0.4 mg by mouth daily after supper.   topiramate 50 MG tablet Commonly known as: TOPAMAX Take 2 tablets (100 mg total) by mouth 2 (two) times daily.   traMADol 50 MG  tablet Commonly known as: ULTRAM Take 1 tablet (50 mg total) by mouth every 6 (six) hours as needed for moderate pain.   traZODone 50 MG tablet Commonly known as: DESYREL Take 0.5 tablets (25 mg total) by mouth at bedtime as needed for sleep.   Tylenol 8 Hour Arthritis Pain 650 MG CR tablet Generic drug: acetaminophen Take 1,300 mg by mouth every 8 (eight) hours as needed for pain (pain).   Vitamin D (Ergocalciferol) 1.25 MG (50000 UNIT) Caps capsule Commonly known as: DRISDOL TAKE 1 CAPSULE BY MOUTH ONE TIME PER WEEK What changed: See the new instructions.   Xarelto 20 MG Tabs tablet Generic drug: rivaroxaban TAKE 1 TABLET BY MOUTH EVERY DAY What changed:  how much to take how to take this when to take this        Allergies  Allergen Reactions   Amoxicillin Swelling    Tolerates Rocephin   Fosinopril Other (See Comments)   Hydromorphone Nausea Only, Other (See Comments) and Nausea And Vomiting   Ampicillin Rash   Monopril [Fosinopril Sodium] Other (See Comments)    Muscle aches & pain   Other Other (See Comments)   Rosuvastatin Other (See Comments)    Joint pain    Consultations: Oncology pulmonology   Procedures/Studies: DG Chest 2 View  Result Date: 07/30/2021 CLINICAL DATA:  Stage IV lung cancer, nausea and vomiting EXAM: CHEST - 2 VIEW COMPARISON:  07/28/2021 chest radiograph. FINDINGS: Stable configuration of right internal jugular Port-A-Cath and left chest loop recorder. Stable cardiomediastinal silhouette with top-normal heart size. No pneumothorax. Chronic mild blunting of the left costophrenic angle. No right pleural effusion. Volume loss and patchy consolidation in the mid to lower left lung, unchanged. No overt pulmonary edema. New faint patchy opacity in the right mid lung. IMPRESSION: 1. New faint patchy opacity in the right mid lung, differential includes mild atelectasis, aspiration or pneumonia. 2. Stable volume loss and patchy consolidation in  the mid to lower left lung compatible with post treatment change. Electronically Signed   By: Ilona Sorrel M.D.   On: 07/30/2021 13:34   DG Chest 2 View  Result Date: 07/18/2021 CLINICAL DATA:  Evaluate for pneumonia EXAM: CHEST - 2 VIEW COMPARISON:  06/05/2021 FINDINGS: Right chest port catheter. Cardiomegaly with implantable loop recorder. Increased heterogeneous airspace opacity of the left lower lobe. IMPRESSION: 1. Increased heterogeneous airspace opacity of the left lower lobe, concerning for infection. 2.  Cardiomegaly. Electronically Signed   By: Delanna Ahmadi M.D.   On: 07/18/2021 12:05   CT  Chest W Contrast  Addendum Date: 07/31/2021   ADDENDUM REPORT: 07/31/2021 15:31 ADDENDUM: Dilated 2.9 cm infrarenal abdominal aorta. Recommend follow-up ultrasound every 5 years. This recommendation follows ACR consensus guidelines: White Paper of the ACR Incidental Findings Committee II on Vascular Findings. J Am Coll Radiol 2013; 10:789-794. Electronically Signed   By: Ilona Sorrel M.D.   On: 07/31/2021 15:31   Result Date: 07/31/2021 CLINICAL DATA:  Stage IV non-small cell left lung cancer diagnosed May 2022 status post palliative radiation therapy completed 03/23/2021 and chemoimmunotherapy. Patient admitted with pneumonia, dyspnea and lower extremity swelling. Restaging. EXAM: CT CHEST, ABDOMEN, AND PELVIS WITH CONTRAST TECHNIQUE: Multidetector CT imaging of the chest, abdomen and pelvis was performed following the standard protocol during bolus administration of intravenous contrast. CONTRAST:  34mL OMNIPAQUE IOHEXOL 350 MG/ML SOLN COMPARISON:  06/02/2021 CT chest, abdomen and pelvis. FINDINGS: CT CHEST FINDINGS Cardiovascular: Top-normal heart size. Stable small pericardial effusion/thickening. Three-vessel coronary atherosclerosis. Right internal jugular Port-A-Cath terminates in the lower third of the SVC. Atherosclerotic thoracic aorta with stable dilated 4.0 cm ascending thoracic aorta. Dilated  main pulmonary artery (4.6 cm diameter). No central pulmonary emboli. Mediastinum/Nodes: No discrete thyroid nodules. Mild circumferential wall thickening in the lower thoracic esophagus, not definitely changed. No pathologically enlarged axillary or mediastinal lymph nodes (previously described mildly enlarged subcarinal lymph node is favored to represent the collapsed esophagus in retrospect). Mildly enlarged 1.3 cm right hilar node (series 2/image 31), increased mildly from 1.0 cm. Lungs/Pleura: No pneumothorax. Trace dependent right pleural effusion, new. Small loculated basilar left pleural effusion, stable. Central medial left lower lobe 2.9 x 2.3 cm solid pulmonary nodule (series 2/image 34), previously 2.9 x 2.2 cm using similar measurement technique, not appreciably changed. New widespread patchy ground-glass opacity and interlobular septal thickening (crazy paving pattern) throughout both lungs, most prominent in right upper lobe. Dense patchy left perihilar and lower left lung consolidation with associated bronchiectasis, volume loss and distortion, similar to mildly increased, favor evolving postradiation change. No new significant pulmonary nodules. Musculoskeletal: No aggressive appearing focal osseous lesions. Moderate thoracic spondylosis. CT ABDOMEN PELVIS FINDINGS Hepatobiliary: Hypodense 2.6 x 1.5 cm caudate lobe liver mass (series 2/image 59), previously 2.6 x 1.7 cm using similar measurement technique, not appreciably changed. No additional liver lesions. Normal gallbladder with no radiopaque cholelithiasis. No biliary ductal dilatation. Pancreas: Normal, with no mass or duct dilation. Spleen: Normal size. No mass. Adrenals/Urinary Tract: Normal adrenals. No hydronephrosis. Simple 1.3 cm anterior interpolar left renal cyst. Normal bladder. Stomach/Bowel: Normal non-distended stomach. Normal caliber small bowel with no small bowel wall thickening. Normal appendix. Moderate sigmoid diverticulosis  with no large bowel wall thickening or significant pericolonic fat stranding. Vascular/Lymphatic: Atherosclerotic abdominal aorta with dilated 2.9 cm infrarenal abdominal aorta. Patent portal, splenic, hepatic and renal veins. No pathologically enlarged lymph nodes in the abdomen or pelvis. Reproductive: Top-normal size prostate with internal fiducial markers. Other: No pneumoperitoneum, ascites or focal fluid collection. Musculoskeletal: No aggressive appearing focal osseous lesions. Moderate thoracic spondylosis. Bilateral posterior spinal fusion hardware at L5-S1. IMPRESSION: 1. New widespread patchy ground-glass opacity and interlobular septal thickening (crazy paving pattern) throughout both lungs, most prominent in the right upper lobe. Differential considerations include atypical/viral pneumonia, drug toxicity and hypersensitivity pneumonitis. 2. Stable central medial left lower lobe pulmonary nodule. Dense patchy left perihilar and lower left lung consolidation with associated bronchiectasis, volume loss and distortion, similar to mildly increased, favor evolving postradiation change. 3. Stable small loculated basilar left pleural effusion. New trace dependent right pleural  effusion. 4. Mild right hilar adenopathy is mildly increased, nonspecific. 5. Stable caudate lobe liver metastasis. No new or progressive metastatic disease in the abdomen or pelvis. 6. Aortic Atherosclerosis (ICD10-I70.0). Electronically Signed: By: Ilona Sorrel M.D. On: 07/31/2021 15:13   CT Abdomen Pelvis W Contrast  Addendum Date: 07/31/2021   ADDENDUM REPORT: 07/31/2021 15:31 ADDENDUM: Dilated 2.9 cm infrarenal abdominal aorta. Recommend follow-up ultrasound every 5 years. This recommendation follows ACR consensus guidelines: White Paper of the ACR Incidental Findings Committee II on Vascular Findings. J Am Coll Radiol 2013; 10:789-794. Electronically Signed   By: Ilona Sorrel M.D.   On: 07/31/2021 15:31   Result Date:  07/31/2021 CLINICAL DATA:  Stage IV non-small cell left lung cancer diagnosed May 2022 status post palliative radiation therapy completed 03/23/2021 and chemoimmunotherapy. Patient admitted with pneumonia, dyspnea and lower extremity swelling. Restaging. EXAM: CT CHEST, ABDOMEN, AND PELVIS WITH CONTRAST TECHNIQUE: Multidetector CT imaging of the chest, abdomen and pelvis was performed following the standard protocol during bolus administration of intravenous contrast. CONTRAST:  30mL OMNIPAQUE IOHEXOL 350 MG/ML SOLN COMPARISON:  06/02/2021 CT chest, abdomen and pelvis. FINDINGS: CT CHEST FINDINGS Cardiovascular: Top-normal heart size. Stable small pericardial effusion/thickening. Three-vessel coronary atherosclerosis. Right internal jugular Port-A-Cath terminates in the lower third of the SVC. Atherosclerotic thoracic aorta with stable dilated 4.0 cm ascending thoracic aorta. Dilated main pulmonary artery (4.6 cm diameter). No central pulmonary emboli. Mediastinum/Nodes: No discrete thyroid nodules. Mild circumferential wall thickening in the lower thoracic esophagus, not definitely changed. No pathologically enlarged axillary or mediastinal lymph nodes (previously described mildly enlarged subcarinal lymph node is favored to represent the collapsed esophagus in retrospect). Mildly enlarged 1.3 cm right hilar node (series 2/image 31), increased mildly from 1.0 cm. Lungs/Pleura: No pneumothorax. Trace dependent right pleural effusion, new. Small loculated basilar left pleural effusion, stable. Central medial left lower lobe 2.9 x 2.3 cm solid pulmonary nodule (series 2/image 34), previously 2.9 x 2.2 cm using similar measurement technique, not appreciably changed. New widespread patchy ground-glass opacity and interlobular septal thickening (crazy paving pattern) throughout both lungs, most prominent in right upper lobe. Dense patchy left perihilar and lower left lung consolidation with associated bronchiectasis,  volume loss and distortion, similar to mildly increased, favor evolving postradiation change. No new significant pulmonary nodules. Musculoskeletal: No aggressive appearing focal osseous lesions. Moderate thoracic spondylosis. CT ABDOMEN PELVIS FINDINGS Hepatobiliary: Hypodense 2.6 x 1.5 cm caudate lobe liver mass (series 2/image 59), previously 2.6 x 1.7 cm using similar measurement technique, not appreciably changed. No additional liver lesions. Normal gallbladder with no radiopaque cholelithiasis. No biliary ductal dilatation. Pancreas: Normal, with no mass or duct dilation. Spleen: Normal size. No mass. Adrenals/Urinary Tract: Normal adrenals. No hydronephrosis. Simple 1.3 cm anterior interpolar left renal cyst. Normal bladder. Stomach/Bowel: Normal non-distended stomach. Normal caliber small bowel with no small bowel wall thickening. Normal appendix. Moderate sigmoid diverticulosis with no large bowel wall thickening or significant pericolonic fat stranding. Vascular/Lymphatic: Atherosclerotic abdominal aorta with dilated 2.9 cm infrarenal abdominal aorta. Patent portal, splenic, hepatic and renal veins. No pathologically enlarged lymph nodes in the abdomen or pelvis. Reproductive: Top-normal size prostate with internal fiducial markers. Other: No pneumoperitoneum, ascites or focal fluid collection. Musculoskeletal: No aggressive appearing focal osseous lesions. Moderate thoracic spondylosis. Bilateral posterior spinal fusion hardware at L5-S1. IMPRESSION: 1. New widespread patchy ground-glass opacity and interlobular septal thickening (crazy paving pattern) throughout both lungs, most prominent in the right upper lobe. Differential considerations include atypical/viral pneumonia, drug toxicity and hypersensitivity pneumonitis. 2.  Stable central medial left lower lobe pulmonary nodule. Dense patchy left perihilar and lower left lung consolidation with associated bronchiectasis, volume loss and distortion,  similar to mildly increased, favor evolving postradiation change. 3. Stable small loculated basilar left pleural effusion. New trace dependent right pleural effusion. 4. Mild right hilar adenopathy is mildly increased, nonspecific. 5. Stable caudate lobe liver metastasis. No new or progressive metastatic disease in the abdomen or pelvis. 6. Aortic Atherosclerosis (ICD10-I70.0). Electronically Signed: By: Ilona Sorrel M.D. On: 07/31/2021 15:13   DG CHEST PORT 1 VIEW  Result Date: 08/03/2021 CLINICAL DATA:  Hypoxia EXAM: PORTABLE CHEST 1 VIEW COMPARISON:  Chest x-ray 08/01/2021 FINDINGS: Cardiomediastinal silhouette is unchanged. Cardiomegaly. Calcified plaques in the aortic arch. Left-sided atrial loop recorder and right-sided central venous port are stable. Mildly increased extensive ground-glass alveolar opacities throughout the right lung since previous study. No significant change in extensive irregular consolidative opacities throughout the left lung. No pneumothorax. IMPRESSION: Extensive bilateral pneumonia as seen previously, with increasing infiltrates on the right since previous study. Electronically Signed   By: Ofilia Neas M.D.   On: 08/03/2021 09:09   DG CHEST PORT 1 VIEW  Result Date: 08/01/2021 CLINICAL DATA:  Shortness of breath. Non-small cell lung cancer on chemotherapy. EXAM: PORTABLE CHEST 1 VIEW COMPARISON:  Radiograph 2 days ago 07/30/2021.  CT yesterday. FINDINGS: Accessed right chest port in place. Planted loop recorder in the left chest wall. Again seen volume loss in the left hemithorax. Pleuroparenchymal opacity in the lower half of the left hemithorax is similar to prior exam. Stable heart size and mediastinal contours. Patchy airspace disease in the right lung primarily in the upper lobe corresponds to ground-glass opacities on CT, with interval progression. Small right pleural effusion on CT is not well seen by radiograph. No pneumothorax or other significant interval  change. IMPRESSION: 1. Patchy airspace disease in the right lung corresponds to ground-glass opacities on CT, with mild interval progression. 2. Otherwise unchanged exam. Chronic volume loss in the left hemithorax with pleuroparenchymal opacity in the left lower lung. Electronically Signed   By: Keith Rake M.D.   On: 08/01/2021 15:04   DG Chest Port 1 View  Result Date: 07/28/2021 CLINICAL DATA:  Shortness of breath and cough EXAM: PORTABLE CHEST 1 VIEW COMPARISON:  Chest x-ray dated July 18, 2021 FINDINGS: Cardiac and mediastinal contours are within normal limits. Unchanged position of right chest wall PICC. Unchanged left lung volume loss with mid and lower lung opacities. No evidence of large pleural region or pneumothorax. No new parenchymal process. IMPRESSION: No new parenchymal process. Unchanged left lower lobe opacity. Electronically Signed   By: Yetta Glassman M.D.   On: 07/28/2021 12:05   DG Abd 2 Views  Result Date: 07/30/2021 CLINICAL DATA:  Nausea, vomiting, stage IV lung cancer EXAM: ABDOMEN - 2 VIEW COMPARISON:  03/24/2021 PET-CT FINDINGS: Loop recorder overlies the left heart. Bilateral posterior spinal fusion hardware at L5-S1. Fiducial markers overlie the region of the prostate. Mild gaseous distention of the stomach. No disproportionately dilated small bowel loops or significant air-fluid levels. Mild-to-moderate colonic gas in stool. No evidence of pneumatosis or pneumoperitoneum. No radiopaque nephrolithiasis. Marked lumbar spondylosis. Nonspecific patchy left lung base opacity. IMPRESSION: Nonobstructive bowel gas pattern. Mild gaseous distention of the stomach. Electronically Signed   By: Ilona Sorrel M.D.   On: 07/30/2021 13:31   DG Swallowing Func-Speech Pathology  Result Date: 08/01/2021 Table formatting from the original result was not included. Objective Swallowing Evaluation: Type of  Study: MBS-Modified Barium Swallow Study  Patient Details Name: KOBY PICKUP  MRN: [id number] Date of Birth: 06/29/1946 Today's Date: 08/01/2021 Time: SLP Start Time (ACUTE ONLY): 1450 -SLP Stop Time (ACUTE ONLY): 1515 SLP Time Calculation (min) (ACUTE ONLY): 25 min Past Medical History: Past Medical History: Diagnosis Date  Anemia   Anxiety   Arthritis   "back, right knee, hands, ankles, neck" (05/31/2016)  Carotid artery disease ([place]) 08/30/2016  Carotid US 11/19: R 1-39, L 40-59 // Carotid US 08/2019: R 1-39; L 40-59 // Carotid US 11/21: R 1-39; L 40-59>> repeat 1 year   Chronic lower back pain   Coronary atherosclerosis of native coronary artery   a. BMS to [facility] 2004 and 2007, otherwise mild nonobstructive disease. EF normal.  Diverticulitis   Dyslipidemia   Dyspnea   Essential hypertension, benign   Family history of breast cancer   Family history of lung cancer   Family history of prostate cancer   GERD (gastroesophageal reflux disease)   Lumbar radiculopathy, chronic 02/04/2015  Right L5  Obesity   OSA on CPAP   uses cpap  Paroxysmal atrial fibrillation ([place])   a. Discovered after stroke.  Pneumonia 01/1996  PONV (postoperative nausea and vomiting)   Prostate CA (HCC)   Recurrent upper respiratory infection (URI)   Stroke ([place]) 07/2012  no deficits  Visit for monitoring Tikosyn therapy 09/18/2019 Past Surgical History: Past Surgical History: Procedure Laterality Date  ADENOIDECTOMY    ANTERIOR CERVICAL DECOMP/DISCECTOMY FUSION  07/2001; 10/2002  "C5-6; C6-7; redo"  BACK SURGERY    CARPAL TUNNEL RELEASE Left 10/2015  CHEST TUBE INSERTION Left 03/17/2021  Procedure: INSERTION PLEURAL DRAINAGE CATHETER;  Surgeon: Garner Nash, DO;  Location: [place];  Service: Pulmonary;  Laterality: Left;  Indwelling Tunneled Pleural catheter (PLEUREX)   COLONOSCOPY W/ POLYPECTOMY  02/2014  CORONARY ANGIOPLASTY WITH STENT PLACEMENT  05/2003; 12/2005  "mid RCA; mid RCA"  FINE NEEDLE ASPIRATION  02/26/2021  Procedure: FINE NEEDLE ASPIRATION (FNA) LINEAR;  Surgeon: Candee Furbish, MD;  Location: [facility]  ENDOSCOPY;  Service: Pulmonary;;  HAND SURGERY  02/2019  LEFT HAND  implantable loop recorder placement  02/27/2020  [employer]onic Reveal [place] model [id number]  implantable loop recorder implanted by Dr Rayann Heman for afib management and evaluation of presyncope  IR IMAGING GUIDED PORT INSERTION  04/16/2021  JOINT REPLACEMENT    KNEE ARTHROSCOPY Left 10/2005  KNEE ARTHROSCOPY W/ PARTIAL MEDIAL MENISCECTOMY Left 09/2005  LUMBAR LAMINECTOMY/DECOMPRESSION MICRODISCECTOMY  03/2005  "L4-5"  POSTERIOR LUMBAR FUSION  10/2003  L5-S1; "plates, screws"  SHOULDER ARTHROSCOPY Right 08/2011  Debridement of labrum, arthroscopic distal clavicle excision  SHOULDER OPEN ROTATOR CUFF REPAIR Left 07/2014  TEE WITHOUT CARDIOVERSION  07/07/2012  Procedure: TRANSESOPHAGEAL ECHOCARDIOGRAM (TEE);  Surgeon: Fay Records, MD;  Location[facility] ENDOSCOPY;  Service: Cardiovascular;  Laterality: N/A;  THORACENTESIS N/A 02/25/2021  Procedure: Mathews Robinsons;  Surgeon: Juanito Doom, MD;  Location: [name];  Service: Cardiopulmonary;  Laterality: N/A;  TONSILLECTOMY AND ADENOIDECTOMY  ~ 1956  TOTAL KNEE ARTHROPLASTY Left 10/2006  TRIGGER FINGER RELEASE Left 10/2015  VIDEO BRONCHOSCOPY WITH ENDOBRONCHIAL ULTRASOUND Left 02/26/2021  Procedure: VIDEO BRONCHOSCOPY WITH ENDOBRONCHIAL ULTRASOUND;  Surgeon: Candee Furbish, MD;  Location: [facility] ENDOSCOPY;  Service: Pulmonary;  Laterality: Left;  cryoprobe too thanks! HPI: 75yo male admitted 07/28/21 with LLE swelling, fatigue and weakness. 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 CXR - unchanged LLL opacity. MRI pending.  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  Pt stated his any deficits from 2013 CVA resolved.  Pt also with h/o ACDF 0/2002 and "re do" 10/1999 - C5-C6, C6-C7. Swallow eval ordered. RN reports pt's oxygen needs have increased overnight.  Pt denies difficulty with swallowing currently.  Endorses reflux/odynophagia during XRT for which he had taken a PPI and carafate *at least.  Subjective: pt awake in chair Assessment / Plan / Recommendation CHL IP CLINICAL IMPRESSIONS 08/01/2021 Clinical Impression Pt was tested with thin, ultrathin (50% water with thin barium mixture), nectar, pudding and cracker.  Pt presents with minimal oral dysphagia characterized by lingual pumping with liquid allowing a few episodes of premature spillage of liquid into pharynx to vallecular space or pyriform sinus prior to swallow trigger.  Pharyngeal swallow is strong with adequate motility without retention.  Decreased timing of laryngeal closure inconsistently resulted in very trace laryngeal penetration of thin but no aspiration.  Due to pt's cervical hardware- chin tuck posture not tested. Upon esophageal sweep, esophagus was clear. After presenting thin barium swallow - pt appeared with retrograde propulsion of liquid to thoracic esophagus causing SLP to question if this could be consistent with dysmotility and contributing to aspiration risk. Defer to referring MD for indication of esophagram during this hospital coarse.  Pt also did not cough during MBS and he was challenged to consume sequential boluses.  Recommend for energy conservation given dyspnea to continue dys3/thin diet.  Will follow up briefly for family education. Pt, spouse and son were educated during [place]. SLP Visit Diagnosis Dysphagia, oral phase (R13.11) Attention and concentration deficit following -- Frontal lobe and executive function deficit following -- Impact on safety and function Mild aspiration risk   CHL IP TREATMENT RECOMMENDATION 08/01/2021 Treatment Recommendations Therapy as outlined in treatment plan below   Prognosis 08/01/2021 Prognosis for Safe Diet Advancement Good Barriers to Reach Goals -- Barriers/Prognosis Comment -- CHL IP DIET RECOMMENDATION 08/01/2021 SLP Diet Recommendations Dysphagia 3 (Mech soft) solids;Thin liquid Liquid  Administration via Cup;Straw Medication Administration Other (Comment) Compensations Slow rate;Small sips/bites Postural Changes Remain semi-upright after after feeds/meals (Comment);Seated upright at 90 degrees   CHL IP OTHER RECOMMENDATIONS 08/01/2021 Recommended Consults -- Oral Care Recommendations Oral care BID Other Recommendations --   CHL IP FOLLOW UP RECOMMENDATIONS 08/01/2021 Follow up Recommendations (No Data)   CHL IP FREQUENCY AND DURATION 08/01/2021 Speech Therapy Frequency (ACUTE ONLY) min 1 x/week Treatment Duration 1 week      CHL IP ORAL PHASE 08/01/2021 Oral Phase Impaired Oral - Pudding Teaspoon -- Oral - Pudding Cup -- Oral - Honey Teaspoon -- Oral - Honey Cup -- Oral - Nectar Teaspoon -- Oral - Nectar Cup -- Oral - Nectar Straw WFL Oral - Thin Teaspoon -- Oral - Thin Cup WFL;Premature spillage Oral - Thin Straw WFL;Premature spillage Oral - Puree Lingual pumping;Delayed oral transit Oral - Mech Soft WFL Oral - Regular -- Oral - Multi-Consistency -- Oral - Pill -- Oral Phase - Comment --  CHL IP PHARYNGEAL PHASE 08/01/2021 Pharyngeal Phase Impaired Pharyngeal- Pudding Teaspoon -- Pharyngeal -- Pharyngeal- Pudding Cup -- Pharyngeal -- Pharyngeal- Honey Teaspoon -- Pharyngeal -- Pharyngeal- Honey Cup -- Pharyngeal -- Pharyngeal- Nectar Teaspoon -- Pharyngeal -- Pharyngeal- Nectar Cup -- Pharyngeal -- Pharyngeal- Nectar Straw Penetration/Aspiration during swallow Pharyngeal Material enters airway, remains ABOVE vocal cords then ejected out Pharyngeal- Thin Teaspoon -- Pharyngeal -- Pharyngeal- Thin Cup Penetration/Aspiration during swallow Pharyngeal Material enters airway, remains ABOVE vocal cords then ejected out Pharyngeal- Thin Straw  Penetration/Aspiration during swallow Pharyngeal Material enters airway, remains ABOVE vocal cords then ejected out Pharyngeal- Puree Delayed swallow initiation-vallecula;WFL Pharyngeal Material does not enter airway Pharyngeal- Mechanical Soft WFL;Delayed swallow  initiation-vallecula Pharyngeal Material does not enter airway Pharyngeal- Regular -- Pharyngeal -- Pharyngeal- Multi-consistency -- Pharyngeal -- Pharyngeal- Pill -- Pharyngeal -- Pharyngeal Comment --  CHL IP CERVICAL ESOPHAGEAL PHASE 08/01/2021 Cervical Esophageal Phase Impaired Pudding Teaspoon -- Pudding Cup -- Honey Teaspoon -- Honey Cup -- Nectar Teaspoon -- Nectar Cup -- Nectar Straw -- Thin Teaspoon -- Thin Cup -- Thin Straw -- Puree -- Mechanical Soft -- Regular -- Multi-consistency -- Pill -- Cervical Esophageal Comment appearance of inconsistent prominent CP, did not impair barium flow Kathleen Lime, MS [facility] SLP Acute Rehab Services Office [phone] Pager [phone] Macario Golds 08/01/2021, 5:44 PM              ECHOCARDIOGRAM COMPLETE  Result Date: 08/04/2021    ECHOCARDIOGRAM REPORT   Patient Name:   ROSHAWN AYALA Date of Exam: 08/04/2021 Medical Rec #:  [id number]   Height:       73.5 in Accession #:    [id number]  Weight:       221.6 lb Date of Birth:  12/04/1945  BSA:          2.259 m Patient Age:    42 years    BP:           125/75 mmHg Patient Gender: M           HR:           101 bpm. Exam Location:  Inpatient Procedure: 2D Echo, Cardiac Doppler and Color Doppler Indications:    Acutre respiratory distress R06.03  History:        Patient has prior history of Echocardiogram examinations, most                 recent 02/25/2021. Stroke, Arrythmias:Atrial Fibrillation; Risk                 Factors:Dyslipidemia and Hypertension.  Sonographer:    Bernadene Person RDCS Referring Phys: [place]  1. Left ventricular ejection fraction, by estimation, is 65 to 70%. The left ventricle has normal function. The left ventricle has no regional wall motion abnormalities. There is mild left ventricular hypertrophy. Left ventricular diastolic parameters are indeterminate.  2. Right ventricular systolic function mild to moderately reduced. The right ventricular size is mildly enlarged.  There is normal pulmonary artery systolic pressure.  3. Trivial mitral valve regurgitation.  4. The aortic valve is tricuspid. Aortic valve regurgitation is not visualized. Mild aortic valve sclerosis is present, with no evidence of aortic valve stenosis.  5. Aortic dilatation noted. There is mild dilatation of the ascending aorta, measuring 40 mm.  6. The inferior vena cava is normal in size with greater than 50% respiratory variability, suggesting right atrial pressure of 3 mmHg. FINDINGS  Left Ventricle: Left ventricular ejection fraction, by estimation, is 65 to 70%. The left ventricle has normal function. The left ventricle has no regional wall motion abnormalities. The left ventricular internal cavity size was normal in size. There is  mild left ventricular hypertrophy. Left ventricular diastolic parameters are indeterminate. Right Ventricle: The right ventricular size is mildly enlarged. Right vetricular wall thickness was not assessed. Right ventricular systolic function mild to moderately reduced. There is normal pulmonary artery systolic pressure. The tricuspid regurgitant velocity is 2.24 m/s, and with an assumed right atrial pressure of  3 mmHg, the estimated right ventricular systolic pressure is 16.1 mmHg. Left Atrium: Left atrial size was normal in size. Right Atrium: Right atrial size was normal in size. Pericardium: Trivial pericardial effusion is present. Presence of pericardial fat pad. Mitral Valve: There is mild thickening of the mitral valve leaflet(s). Mild mitral annular calcification. Trivial mitral valve regurgitation. Tricuspid Valve: The tricuspid valve is normal in structure. Tricuspid valve regurgitation is trivial. Aortic Valve: The aortic valve is tricuspid. Aortic valve regurgitation is not visualized. Mild aortic valve sclerosis is present, with no evidence of aortic valve stenosis. Pulmonic Valve: The pulmonic valve was normal in structure. Pulmonic valve regurgitation is mild to  moderate. Aorta: Aortic dilatation noted. There is mild dilatation of the ascending aorta, measuring 40 mm. Venous: The inferior vena cava is normal in size with greater than 50% respiratory variability, suggesting right atrial pressure of 3 mmHg. IAS/Shunts: No atrial level shunt detected by color flow Doppler.  LEFT VENTRICLE PLAX 2D LVIDd:         4.20 cm   Diastology LVIDs:         2.60 cm   LV e' medial:    5.33 cm/s LV PW:         1.00 cm   LV E/e' medial:  10.7 LV IVS:        1.20 cm   LV e' lateral:   5.68 cm/s LVOT diam:     2.40 cm   LV E/e' lateral: 10.1 LV SV:         55 LV SV Index:   24 LVOT Area:     4.52 cm  RIGHT VENTRICLE RV S prime:     19.10 cm/s TAPSE (M-mode): 1.9 cm LEFT ATRIUM           Index        RIGHT ATRIUM           Index LA diam:      2.70 cm 1.20 cm/m   RA Area:     17.30 cm LA Vol (A2C): 34.1 ml 15.10 ml/m  RA Volume:   46.70 ml  20.68 ml/m LA Vol (A4C): 34.2 ml 15.14 ml/m  AORTIC VALVE LVOT Vmax:   81.80 cm/s LVOT Vmean:  54.400 cm/s LVOT VTI:    0.121 m  AORTA Ao Root diam: 3.40 cm Ao Asc diam:  4.00 cm MITRAL VALVE               TRICUSPID VALVE MV Area (PHT): 4.21 cm    TR Peak grad:   20.1 mmHg MV Decel Time: 180 msec    TR Vmax:        224.00 cm/s MV E velocity: 57.20 cm/s MV A velocity: 86.80 cm/s  SHUNTS MV E/A ratio:  0.66        Systemic VTI:  0.12 m                            Systemic Diam: 2.40 cm Dorris Carnes MD Electronically signed by Dorris Carnes MD Signature Date/Time: 08/04/2021/5:53:27 PM    Final    CUP PACEART REMOTE DEVICE CHECK  Result Date: 08/11/2021 ILR summary report received. Battery status OK. Normal device function. No new symptom,  brady, or pause episodes. No new AF episodes. Monthly summary reports and ROV/PRN 2 tachy events, longest duration 15sec, rate 176 EGM shows narrow, regular, tachy rhythm LR  VAS [place] LOWER EXTREMITY VENOUS (DVT)  Result Date: 07/30/2021  Lower Venous DVT Study Patient Name:  SOUA CALTAGIRONE  Date of Exam:   07/30/2021  Medical Rec #: [id number]    Accession #:    [id number] Date of Birth: [birth year]   Patient Gender: M Patient Age:   57 years Exam Location:  [facility] Procedure:      VAS [place] LOWER EXTREMITY VENOUS (DVT) Referring Phys: Quillian Quince THOMPSON --------------------------------------------------------------------------------  Indications: LLE cellulitis.  Risk Factors: CA patient on chemotherapy. Limitations: Poor ultrasound/tissue interface. Comparison Study: No previous exams Performing Technologist: Jody Hill RVT, RDMS  Examination Guidelines: A complete evaluation includes B-mode imaging, spectral Doppler, color Doppler, and power Doppler as needed of all accessible portions of each vessel. Bilateral testing is considered an integral part of a complete examination. Limited examinations for reoccurring indications may be performed as noted. The reflux portion of the exam is performed with the patient in reverse Trendelenburg.  +---------+---------------+---------+-----------+----------+--------------+ RIGHT    CompressibilityPhasicitySpontaneityPropertiesThrombus Aging +---------+---------------+---------+-----------+----------+--------------+ CFV      Full           Yes      Yes                                 +---------+---------------+---------+-----------+----------+--------------+ SFJ      Full                                                        +---------+---------------+---------+-----------+----------+--------------+ FV Prox  Full           Yes      Yes                                 +---------+---------------+---------+-----------+----------+--------------+ FV Mid   Full           Yes      Yes                                 +---------+---------------+---------+-----------+----------+--------------+ FV DistalFull           Yes      Yes                                 +---------+---------------+---------+-----------+----------+--------------+ PFV      Full                                                         +---------+---------------+---------+-----------+----------+--------------+ POP      Full           Yes      Yes                                 +---------+---------------+---------+-----------+----------+--------------+ PTV      Full                                                        +---------+---------------+---------+-----------+----------+--------------+  PERO     Full                                                        +---------+---------------+---------+-----------+----------+--------------+   +---------+---------------+---------+-----------+----------+--------------+ LEFT     CompressibilityPhasicitySpontaneityPropertiesThrombus Aging +---------+---------------+---------+-----------+----------+--------------+ CFV      Full           Yes      Yes                                 +---------+---------------+---------+-----------+----------+--------------+ SFJ      Full                                                        +---------+---------------+---------+-----------+----------+--------------+ FV Prox  Full           Yes      Yes                                 +---------+---------------+---------+-----------+----------+--------------+ FV Mid   Full           Yes      Yes                                 +---------+---------------+---------+-----------+----------+--------------+ FV DistalFull           Yes      Yes                                 +---------+---------------+---------+-----------+----------+--------------+ PFV      Full                                                        +---------+---------------+---------+-----------+----------+--------------+ POP      Full           Yes      Yes                                 +---------+---------------+---------+-----------+----------+--------------+ PTV      Full                                                         +---------+---------------+---------+-----------+----------+--------------+ PERO     Full                                                        +---------+---------------+---------+-----------+----------+--------------+  Summary: BILATERAL: - No evidence of deep vein thrombosis seen in the lower extremities, bilaterally. - No evidence of superficial venous thrombosis in the lower extremities, bilaterally. -No evidence of popliteal cyst, bilaterally.   *See table(s) above for measurements and observations. Electronically signed by Orlie Pollen on 07/30/2021 at 6:44:08 PM.    Final       Subjective: Patient sitting up in chair. He says he continues to have some cough, but overall is feeling better.  Discharge Exam: Vitals:   08/11/21 2224 08/12/21 0500 08/12/21 0614 08/12/21 0805  BP:   124/82   Pulse: 87  81   Resp: 15  18   Temp:   98 F (36.7 C)   TempSrc:   Oral   SpO2: 96%  95% 95%  Weight:  96.2 kg    Height:        General: Pt is alert, awake, not in acute distress Cardiovascular: RRR, S1/S2 +, no rubs, no gallops Respiratory: CTA bilaterally, no wheezing, no rhonchi Abdominal: Soft, NT, ND, bowel sounds + Extremities: no edema, no cyanosis    The results of significant diagnostics from this hospitalization (including imaging, microbiology, ancillary and laboratory) are listed below for reference.     Microbiology: No results found for this or any previous visit (from the past 240 hour(s)).   Labs: BNP (last 3 results) Recent Labs    08/01/21 1630 08/03/21 0315  BNP 94.3 97.9   Basic Metabolic Panel: Recent Labs  Lab 08/06/21 0453 08/09/21 0539 08/11/21 0316 08/12/21 0332  NA 133* 137 135 136  K 4.5 4.1 4.1 3.5  CL 103 101 103 103  CO2 24 28 26 26   GLUCOSE 132* 93 129* 100*  BUN 23 20 23 23   CREATININE 0.68 0.53* 0.76 0.72  CALCIUM 9.0 9.0 8.7* 8.7*   Liver Function Tests: Recent Labs  Lab 08/06/21 0453 08/09/21 0539  08/11/21 0316 08/12/21 0332  AST 46* 29 27 27   ALT 20 23 17 11   ALKPHOS 65 60 58 64  BILITOT 0.5 0.5 0.6 0.6  PROT 6.6 6.5 6.2* 6.3*  ALBUMIN 2.1* 2.2* 2.3* 2.4*   No results for input(s): LIPASE, AMYLASE in the last 168 hours. No results for input(s): AMMONIA in the last 168 hours. CBC: Recent Labs  Lab 08/06/21 0453 08/09/21 0539 08/11/21 0316 08/12/21 0332  WBC 8.1 8.0 7.6 8.3  HGB 9.6* 10.4* 10.5* 10.8*  HCT 29.8* 33.3* 32.4* 33.7*  MCV 98.3 100.6* 98.2 99.1  PLT 247 288 297 319   Cardiac Enzymes: No results for input(s): CKTOTAL, CKMB, CKMBINDEX, TROPONINI in the last 168 hours. BNP: Invalid input(s): POCBNP CBG: No results for input(s): GLUCAP in the last 168 hours. D-Dimer No results for input(s): DDIMER in the last 72 hours. Hgb A1c No results for input(s): HGBA1C in the last 72 hours. Lipid Profile No results for input(s): CHOL, HDL, LDLCALC, TRIG, CHOLHDL, LDLDIRECT in the last 72 hours. Thyroid function studies No results for input(s): TSH, T4TOTAL, T3FREE, THYROIDAB in the last 72 hours.  Invalid input(s): FREET3 Anemia work up No results for input(s): VITAMINB12, FOLATE, FERRITIN, TIBC, IRON, RETICCTPCT in the last 72 hours. Urinalysis    Component Value Date/Time   COLORURINE YELLOW 07/28/2021 [place] 07/28/2021 1240   LABSPEC 1.009 07/28/2021 1240   PHURINE 6.0 07/28/2021 1240   GLUCOSEU NEGATIVE 07/28/2021 [place] 07/28/2021 [place] 07/28/2021 [place] 07/28/2021 [place]  NEGATIVE 07/28/2021 1240   NITRITE NEGATIVE 07/28/2021 [place] 07/28/2021 1240   Sepsis Labs Invalid input(s): PROCALCITONIN,  WBC,  LACTICIDVEN Microbiology No results found for this or any previous visit (from the past 240 hour(s)).   Time coordinating discharge: 9mins  SIGNED:   Kathie Dike, MD  [facility] 08/12/2021, 11:26 AM   If 7PM-7AM, please  contact night-coverage [email or web address]

## 2021-08-12 NOTE — H&P (Signed)
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 [name], prostate cancer, Parkinson's disease maintained on Sinemet, OSA, atrial fibrillation maintained on Xarelto, CAD, hyperlipidemia, obesity with BMI 27.60, quit smoking 18 years ago, chronic low back pain with radiculopathy.  Patient also recently completed course of Levaquin for pneumonia.  Per chart review patient lives with spouse.  Two-level home bed and bath upstairs.  Patient was using a cane prior to admission and had been receiving home health therapies.  Presented 07/28/2021 with left lower extremity swelling and generalized weakness with fatigue.  He reports over a 1 week.  Erythema and swelling left ankle.  Patient did develop some intractable nausea and vomiting.  Chest x-ray showed unchanged left lower lobe opacity.  CT of the chest abdomen pelvis showed new widespread patchy groundglass opacities and interlobar septal thickening throughout both lungs most prominent in the right upper lobe.  Stable central medial left lower lobe pulmonary nodule.  Stable small loculated basilar left pleural effusion.  Stable caudate lobe liver metastasis no new or progressive metastatic disease in the abdomen or pelvis.  Echocardiogram with ejection fraction of 65 to 70% no wall motion abnormalities.  Admission chemistry sodium 126 glucose 110, hemoglobin 9.0, lactic acid 0.7, blood cultures no growth to date, urinalysis negative.  Placed on IV antibiotics for left lower extremity cellulitis.  Critical care did follow-up for acute respiratory failure with hypoxia likely secondary to aspiration pneumonia versus drug-induced pneumonitis and slowly weaned from oxygen.  Noted sed rate greater than 140.  He was placed on steroids with taper.  Currently on a mechanical soft diet.  Acute on chronic anemia likely  secondary to his chemotherapy no overt bleeding he was transfused 2 unit packed red blood cells 08/02/2021.  Therapy evaluations completed due to patient's decreased functional mobility was admitted for a comprehensive rehab program. He currently has no new complaints.  Review of Systems  Constitutional:  Positive for malaise/fatigue. Negative for chills and fever.  HENT:  Negative for hearing loss.   Eyes:  Negative for blurred vision and double vision.  Respiratory:  Negative for cough.        Dyspnea on exertion  Cardiovascular:  Positive for palpitations and leg swelling. Negative for chest pain.  Gastrointestinal:  Positive for nausea and vomiting.       GERD  Genitourinary:  Positive for urgency. Negative for dysuria, flank pain and hematuria.  Musculoskeletal:  Positive for back pain.  Neurological:  Positive for weakness.  Psychiatric/Behavioral:  The patient has insomnia.        Anxiety  All other systems reviewed and are negative. Past Medical History:  Diagnosis Date   Anemia    Anxiety    Arthritis    "back, right knee, hands, ankles, neck" (05/31/2016)   Carotid artery disease ([place]) 08/30/2016   Carotid US 11/19: R 1-39, L 40-59 // Carotid US 08/2019: R 1-39; L 40-59 // Carotid US 11/21: R 1-39; L 40-59>> repeat 1 year    Chronic lower back pain    Coronary atherosclerosis of native coronary artery    a. BMS to [facility] 2004 and 2007, otherwise mild nonobstructive disease. EF normal.   Diverticulitis    Dyslipidemia    Dyspnea    Essential hypertension, benign    Family history of breast cancer    Family history of lung cancer  Family history of prostate cancer    GERD (gastroesophageal reflux disease)    Lumbar radiculopathy, chronic 02/04/2015   Right L5   Obesity    OSA on CPAP    uses cpap   Paroxysmal atrial fibrillation ([place])    a. Discovered after stroke.   Pneumonia 01/1996   PONV (postoperative nausea and vomiting)    Prostate CA ([place])    Recurrent  upper respiratory infection (URI)    Stroke ([place]) 07/2012   no deficits   Visit for monitoring Tikosyn therapy 09/18/2019   Past Surgical History:  Procedure Laterality Date   ADENOIDECTOMY     ANTERIOR CERVICAL DECOMP/DISCECTOMY FUSION  07/2001; 10/2002   "C5-6; C6-7; redo"   BACK SURGERY     CARPAL TUNNEL RELEASE Left 10/2015   CHEST TUBE INSERTION Left 03/17/2021   Procedure: INSERTION PLEURAL DRAINAGE CATHETER;  Surgeon: Garner Nash, DO;  Location: [place];  Service: Pulmonary;  Laterality: Left;  Indwelling Tunneled Pleural catheter (PLEUREX)    COLONOSCOPY W/ POLYPECTOMY  02/2014   CORONARY ANGIOPLASTY WITH STENT PLACEMENT  05/2003; 12/2005   "mid RCA; mid RCA"   FINE NEEDLE ASPIRATION  02/26/2021   Procedure: FINE NEEDLE ASPIRATION (FNA) LINEAR;  Surgeon: Candee Furbish, MD;  Location: [facility] ENDOSCOPY;  Service: Pulmonary;;   HAND SURGERY  02/2019   LEFT HAND   implantable loop recorder placement  02/27/2020   [employer] Reveal [place] model [id number] [id number]  implantable loop recorder implanted by Dr Rayann Heman for afib management and evaluation of presyncope   IR IMAGING GUIDED PORT INSERTION  04/16/2021   JOINT REPLACEMENT     KNEE ARTHROSCOPY Left 10/2005   KNEE ARTHROSCOPY W/ PARTIAL MEDIAL MENISCECTOMY Left 09/2005   LUMBAR LAMINECTOMY/DECOMPRESSION MICRODISCECTOMY  03/2005   "L4-5"   POSTERIOR LUMBAR FUSION  10/2003   L5-S1; "plates, screws"   SHOULDER ARTHROSCOPY Right 08/2011   Debridement of labrum, arthroscopic distal clavicle excision   SHOULDER OPEN ROTATOR CUFF REPAIR Left 07/2014   TEE WITHOUT CARDIOVERSION  07/07/2012   Procedure: TRANSESOPHAGEAL ECHOCARDIOGRAM (TEE);  Surgeon: Fay Records, MD;  Location: [facility] ENDOSCOPY;  Service: Cardiovascular;  Laterality: N/A;   THORACENTESIS N/A 02/25/2021   Procedure: Mathews Robinsons;  Surgeon: Juanito Doom, MD;  Location: [place];  Service: Cardiopulmonary;  Laterality: N/A;   TONSILLECTOMY AND ADENOIDECTOMY  ~ 1956    TOTAL KNEE ARTHROPLASTY Left 10/2006   TRIGGER FINGER RELEASE Left 10/2015   VIDEO BRONCHOSCOPY WITH ENDOBRONCHIAL ULTRASOUND Left 02/26/2021   Procedure: VIDEO BRONCHOSCOPY WITH ENDOBRONCHIAL ULTRASOUND;  Surgeon: Candee Furbish, MD;  Location: [facility] ENDOSCOPY;  Service: Pulmonary;  Laterality: Left;  cryoprobe too thanks!   Family History  Problem Relation Age of Onset   Hypertension Mother    Heart disease Father    Heart attack Father    Prostate cancer Brother 98   Parkinson's disease Brother    Lung cancer Paternal Aunt        hx of smoking   Breast cancer Paternal Grandmother        dx 18s, bilateral mastectomies   Heart attack Paternal Grandfather    Blindness Son    Colon cancer Neg Hx    Pancreatic cancer Neg Hx    Social History:  reports that he quit smoking about 18 years ago. His smoking use included cigarettes. He has a 34.00 pack-year smoking history. He has never been exposed to tobacco smoke. He has never used smokeless tobacco. He reports that he does not  drink alcohol and does not use drugs. Allergies:  Allergies  Allergen Reactions   Amoxicillin Swelling    Tolerates Rocephin   Fosinopril Other (See Comments)   Hydromorphone Nausea Only, Other (See Comments) and Nausea And Vomiting   Ampicillin Rash   Monopril [Fosinopril Sodium] Other (See Comments)    Muscle aches & pain   Other Other (See Comments)   Rosuvastatin Other (See Comments)    Joint pain   Medications Prior to Admission  Medication Sig Dispense Refill   acetaminophen (TYLENOL 8 HOUR ARTHRITIS PAIN) 650 MG CR tablet Take 1,300 mg by mouth every 8 (eight) hours as needed for pain (pain).     albuterol (PROVENTIL) (2.5 MG/3ML) 0.083% nebulizer solution Inhale 3 mLs into the lungs every 4 (four) hours as needed for wheezing or shortness of breath. 75 mL 12   ALPRAZolam (XANAX) 0.5 MG tablet Take 1 tablet (0.5 mg total) by mouth at bedtime as needed for anxiety. 10 tablet 0   atorvastatin (LIPITOR) 40  MG tablet Take 1 tablet (40 mg total) by mouth daily. 90 tablet 3   benzonatate (TESSALON) 200 MG capsule TAKE 1 CAPSULE (200 MG TOTAL) BY MOUTH 3 (THREE) TIMES DAILY AS NEEDED FOR COUGH. 60 capsule 1   budesonide (PULMICORT) 0.5 MG/2ML nebulizer solution Take 2 mLs (0.5 mg total) by nebulization 2 (two) times daily.  12   carbidopa-levodopa (SINEMET IR) 25-100 MG tablet Take 1 tablet by mouth 3 (three) times daily. (Patient taking differently: Take 1 tablet by mouth 3 (three) times daily. 0800, 1200, 1600) 270 tablet 1   Carbinoxamine Maleate 6 MG TABS Take 1 tablet by mouth 2 (two) times daily as needed (drainage). 60 tablet 4   chlorpheniramine-HYDROcodone (TUSSIONEX) 10-8 MG/5ML SUER Take 5 mLs by mouth every 12 (twelve) hours as needed for cough. 473 mL 0   dapsone 100 MG tablet Take 1 tablet (100 mg total) by mouth daily.     dofetilide (TIKOSYN) 500 MCG capsule Take 1 capsule (500 mcg total) by mouth 2 (two) times daily. Please keep upcoming appointment in February for future refills. Thank you 180 capsule 3   fenofibrate 160 MG tablet TAKE 1 TABLET DAILY. PLEASE KEEP UPCOMING APPT IN MARCH WITH DR. Acie Fredrickson BEFORE ANYMORE REFILLS. (Patient taking differently: Take 160 mg by mouth daily.) 90 tablet 3   fluticasone (FLONASE) 50 MCG/ACT nasal spray Place 2 sprays into both nostrils daily. 69.6 mL 5   folic acid (FOLVITE) 1 MG tablet TAKE 1 TABLET BY MOUTH EVERY DAY (Patient taking differently: Take 1 mg by mouth daily.) 90 tablet 1   guaiFENesin (MUCINEX) 600 MG 12 hr tablet Take 2 tablets (1,200 mg total) by mouth 2 (two) times daily.     ipratropium (ATROVENT) 0.02 % nebulizer solution Take 2.5 mLs (0.5 mg total) by nebulization 2 (two) times daily. 75 mL 12   ipratropium (ATROVENT) 0.03 % nasal spray Place 1-2 sprays into both nostrils every 12 (twelve) hours. For drainage 30 mL 5   levalbuterol (XOPENEX) 0.63 MG/3ML nebulizer solution Take 3 mLs (0.63 mg total) by nebulization 2 (two) times  daily. 3 mL 12   lidocaine-prilocaine (EMLA) cream Apply 1 application topically as needed. (Patient taking differently: Apply 1 application topically as needed (treatment).) 30 g 2   loperamide (IMODIUM) 2 MG capsule Take 2 mg by mouth daily as needed for diarrhea or loose stools.     magnesium oxide (MAG-OX) 400 (240 Mg) MG tablet TAKE 1 TABLET BY MOUTH  EVERY DAY (Patient taking differently: Take 400 mg by mouth daily.) 90 tablet 2   metoprolol succinate (TOPROL-XL) 25 MG 24 hr tablet TAKE 1 TABLET BY MOUTH EVERY DAY (Patient taking differently: Take 25 mg by mouth daily.) 90 tablet 1   Multiple Vitamins-Minerals (MULTIVITAMINS THER. W/MINERALS) TABS Take 1 tablet by mouth daily.     nitroGLYCERIN (NITROSTAT) 0.4 MG SL tablet Place 1 tablet (0.4 mg total) under the tongue every 5 (five) minutes as needed for chest pain. 25 tablet 5   omeprazole (PRILOSEC) 20 MG capsule Take 1 capsule (20 mg total) by mouth daily. 30 capsule 5   ondansetron (ZOFRAN) 8 MG tablet Take 1 tablet (8 mg total) by mouth every 8 (eight) hours as needed for nausea or vomiting. Starting 3 days after chemotherapy 30 tablet 2   potassium chloride (KLOR-CON) 10 MEQ tablet TAKE 1 TABLET BY MOUTH EVERY DAY (Patient taking differently: Take 10 mEq by mouth daily.) 90 tablet 1   predniSONE (DELTASONE) 50 MG tablet Take 2 tablets (100 mg total) by mouth daily with breakfast.     promethazine (PHENERGAN) 25 MG tablet Take 1 tablet (25 mg total) by mouth every 6 (six) hours as needed for nausea or vomiting. 30 tablet 1   Saccharomyces boulardii (PROBIOTIC) 250 MG CAPS Take 250 mg by mouth daily.     solifenacin (VESICARE) 5 MG tablet Take 5 mg by mouth at bedtime.     tamsulosin (FLOMAX) 0.4 MG CAPS capsule Take 0.4 mg by mouth daily after supper.     Tiotropium Bromide-Olodaterol (STIOLTO RESPIMAT) 2.5-2.5 MCG/ACT AERS Inhale 2 puffs into the lungs daily. 4 g 5   topiramate (TOPAMAX) 50 MG tablet Take 2 tablets (100 mg total) by mouth  2 (two) times daily. 360 tablet 3   traMADol (ULTRAM) 50 MG tablet Take 1 tablet (50 mg total) by mouth every 6 (six) hours as needed for moderate pain. 30 tablet 0   traZODone (DESYREL) 50 MG tablet Take 0.5 tablets (25 mg total) by mouth at bedtime as needed for sleep. 45 tablet 3   Vitamin D, Ergocalciferol, (DRISDOL) 1.25 MG (50000 UNIT) CAPS capsule TAKE 1 CAPSULE BY MOUTH ONE TIME PER WEEK (Patient taking differently: Take 50,000 Units by mouth every 7 (seven) days. Monday) 12 capsule 1   XARELTO 20 MG TABS tablet TAKE 1 TABLET BY MOUTH EVERY DAY (Patient taking differently: 20 mg daily with supper.) 90 tablet 2    Drug Regimen Review Drug regimen was reviewed and remains appropriate with no significant issues identified   Home: Home Living Family/patient expects to be discharged to:: Skilled nursing facility Living Arrangements: Spouse/significant other Available Help at Discharge: Family, Available PRN/intermittently, Friend(s) (spouse works outside the home) Type of Home: [employer] Home Access: Stairs to enter Technical brewer of Steps: 2 Entrance Stairs-Rails: None Home Layout: Two level, Bed/bath upstairs, 1/2 bath on main level Alternate Level Stairs-Number of Steps: flight Alternate Level Stairs-Rails: Right, Left (B rails on first half of steps, then R rail only the rest of the way up) Bathroom Shower/Tub: Chiropodist: Handicapped height Bathroom Accessibility: No Home Equipment: Rollator (4 wheels), Cane - single point, Shower seat, Conservation officer, nature (2 wheels), Hand held shower head, Transport chair, Adaptive equipment Adaptive Equipment: Reacher, Sock aid, Long-handled shoe horn, Long-handled sponge Additional Comments: Pt no longer with Life Alert system  Lives With: Spouse  Functional History: Prior Function Prior Level of Function : Needs assist Physical Assist : ADLs (physical) ADLs (  physical): IADLs Mobility Comments: Pt reports that he was  using a cane when first home from [place], but able to get OOB and transfer without assistance until recently. Pt reports active HH PT with [facility] ADLs Comments: Pt reports that he was performing his basic ADLs on his own with assistance with IADLs.   Functional Status:  Mobility: Bed Mobility Overal bed mobility: Needs Assistance Bed Mobility: Rolling, Sidelying to Sit Rolling: Min guard Sidelying to sit: Min assist, HOB elevated Supine to sit: Max assist, HOB elevated Sit to supine: Min assist General bed mobility comments: cues to roll to the left.  able to place legs over bed edge, extra time but gradually ABLE TO PUSH HIMSELF TO UPRIGHT WITH HOB raised to ~ 45*. [place]le to use both UE's to scoot to bed edge with extra assistance Transfers Overall transfer level: Needs assistance Equipment used: Rolling walker (2 wheels) Transfers: Sit to/from Stand Sit to Stand: Min guard Stand pivot transfers: Min assist General transfer comment: Bed raised  a few inches. patient able to pull self up to stnading without extra support except support RW. Patient performed 3 times. Ambulation/Gait Ambulation/Gait assistance: Max assist, +2 physical assistance, +2 safety/equipment Gait Distance (Feet): 2 Feet Assistive device: Rolling walker (2 wheels) Gait Pattern/deviations: Step-to pattern General Gait Details: First stand, marched in place x 5, sat to rest, then stood againa nd steped in place x 8. 3rd time stepped to recliner using RW with noted fatigue but no buckling.Patient performed 5  timthen 8 Gait velocity: decreased  ADL: ADL Overall ADL's : Needs assistance/impaired Eating/Feeding: Modified independent, Sitting Eating/Feeding Details (indicate cue type and reason): patient needed min A for set up to be able to set up cereal for breakfast Grooming: Oral care, Standing Grooming Details (indicate cue type and reason): Pt tolerated <1 complete grooming task while standing at sink but with  loss of balance while leaning forward to expectorate into sink. Pt required moderate assistance to ease into chair in order to avoid fall to floor as pt was unable to recover balance after expectorating.  Once safe in chair and after rest break, pt completed oral hygiene and wiped face. Upper Body Bathing: Min guard, Sitting Upper Body Bathing Details (indicate cue type and reason): sitting on edge of bed with increased time Lower Body Bathing: Minimal assistance, Sitting/lateral leans Upper Body Dressing : Set up, Sitting Upper Body Dressing Details (indicate cue type and reason): EOB Lower Body Dressing: With adaptive equipment, Minimal assistance, Sit to/from stand, Sitting/lateral leans Toilet Transfer: BSC/3in1, Rolling walker (2 wheels), Stand-pivot Toilet Transfer Details (indicate cue type and reason): After prolonged rest in recliner after grooming at sink, pt requested BSC for BM. Pt stood from recliner with Min guard and pivoted to [facility] using RW with Min guard. Min Assist needed to descend to commode. Hygiene not tested as pt asked for privacy. Pt given call bell and Nurse informed of pt up on BSC. Toileting- Clothing Manipulation and Hygiene: Maximal assistance Toileting - Clothing Manipulation Details (indicate cue type and reason): Max Assist needed to manage clothing prior to sitting on BSC. Functional mobility during ADLs: Min guard, Minimal assistance, Rolling walker (2 wheels) General ADL Comments: patient is min A +2 for transfers on this date with increased time to complete transfer and education on proper hand and foot placement,.   Cognition: Cognition Overall Cognitive Status: Within Functional Limits for tasks assessed Orientation Level: Oriented X4 Cognition Arousal/Alertness: Awake/alert Behavior During Therapy: WFL for tasks assessed/performed Overall  Cognitive Status: Within Functional Limits for tasks assessed General Comments: very motivated and pleasant,    Physical Exam: Blood pressure 124/77, pulse 91, temperature 97.8 F (36.6 C), temperature source Oral, resp. rate 17, height 6' 1.5" (1.867 m), SpO2 96 %. Physical Exam Gen: no distress, normal appearing, eating lunch HEENT: oral mucosa pink and moist, NCAT Cardio: Reg rate Chest: normal effort, normal rate of breathing Abd: soft, non-distended Ext: no edema Psych: jovial and pleasant, highly motivated Skin: intact Neurological:     Comments: Patient is awake and alert.    Oriented to person and place.  Follows commands. 5/5 strength throughout  Results for orders placed or performed during the hospital encounter of 07/28/21 (from the past 48 hour(s))  Comprehensive metabolic panel     Status: Abnormal   Collection Time: 08/11/21  3:16 AM  Result Value Ref Range   Sodium 135 135 - 145 mmol/L   Potassium 4.1 3.5 - 5.1 mmol/L   Chloride 103 98 - 111 mmol/L   CO2 26 22 - 32 mmol/L   Glucose, Bld 129 (H) 70 - 99 mg/dL    Comment: Glucose reference range applies only to samples taken after fasting for at least 8 hours.   BUN 23 8 - 23 mg/dL   Creatinine, Ser 0.76 0.61 - 1.24 mg/dL   Calcium 8.7 (L) 8.9 - 10.3 mg/dL   Total Protein 6.2 (L) 6.5 - 8.1 g/dL   Albumin 2.3 (L) 3.5 - 5.0 g/dL   AST 27 15 - 41 U/L   ALT 17 0 - 44 U/L   Alkaline Phosphatase 58 38 - 126 U/L   Total Bilirubin 0.6 0.3 - 1.2 mg/dL   GFR, Estimated >60 >60 mL/min    Comment: (NOTE) Calculated using the CKD-EPI Creatinine Equation (2021)    Anion gap 6 5 - 15    Comment: Performed at [facility], [place] [address], [place], [place] [address]  CBC     Status: Abnormal   Collection Time: 08/11/21  3:16 AM  Result Value Ref Range   WBC 7.6 4.0 - 10.5 K/uL   RBC 3.30 (L) 4.22 - 5.81 MIL/uL   Hemoglobin 10.5 (L) 13.0 - 17.0 g/dL   HCT 32.4 (L) 39.0 - 52.0 %   MCV 98.2 80.0 - 100.0 fL   MCH 31.8 26.0 - 34.0 pg   MCHC 32.4 30.0 - 36.0 g/dL   RDW 19.1 (H) 11.5 - 15.5 %   Platelets 297 150 -  400 K/uL   nRBC 0.0 0.0 - 0.2 %    Comment: Performed at [facility], [place] [address], [place], [place] [address]  Comprehensive metabolic panel     Status: Abnormal   Collection Time: 08/12/21  3:32 AM  Result Value Ref Range   Sodium 136 135 - 145 mmol/L   Potassium 3.5 3.5 - 5.1 mmol/L   Chloride 103 98 - 111 mmol/L   CO2 26 22 - 32 mmol/L   Glucose, Bld 100 (H) 70 - 99 mg/dL    Comment: Glucose reference range applies only to samples taken after fasting for at least 8 hours.   BUN 23 8 - 23 mg/dL   Creatinine, Ser 0.72 0.61 - 1.24 mg/dL   Calcium 8.7 (L) 8.9 - 10.3 mg/dL   Total Protein 6.3 (L) 6.5 - 8.1 g/dL   Albumin 2.4 (L) 3.5 - 5.0 g/dL   AST 27 15 - 41 U/L   ALT 11 0 -  44 U/L   Alkaline Phosphatase 64 38 - 126 U/L   Total Bilirubin 0.6 0.3 - 1.2 mg/dL   GFR, Estimated >60 >60 mL/min    Comment: (NOTE) Calculated using the CKD-EPI Creatinine Equation (2021)    Anion gap 7 5 - 15    Comment: Performed at [facility], [place] [address], [place], [place] [address]  CBC     Status: Abnormal   Collection Time: 08/12/21  3:32 AM  Result Value Ref Range   WBC 8.3 4.0 - 10.5 K/uL   RBC 3.40 (L) 4.22 - 5.81 MIL/uL   Hemoglobin 10.8 (L) 13.0 - 17.0 g/dL   HCT 33.7 (L) 39.0 - 52.0 %   MCV 99.1 80.0 - 100.0 fL   MCH 31.8 26.0 - 34.0 pg   MCHC 32.0 30.0 - 36.0 g/dL   RDW 19.5 (H) 11.5 - 15.5 %   Platelets 319 150 - 400 K/uL   nRBC 0.0 0.0 - 0.2 %    Comment: Performed at [facility], [place] [address], [place], [place] [address]   No results found.     Medical Problem List and Plan: 1.  Debility secondary to left lower extremity cellulitis complicated by acute respiratory failure/pneumonia/multi medical  -patient may shower  -ELOS/Goals: 2-3 weeks S-MinA  -Admit to CIR 2.  Antithrombotics: -DVT/anticoagulation:  Pharmaceutical: Xarelto.  Vascular ultrasound lower extremities negative  -antiplatelet therapy:  N/A. 3. Pain: Continue Topamax 100 mg twice daily, Tramadol as needed 4. Anxiety: Continue Xanax 0.5 mg nightly as needed  -antipsychotic agents: N/A 5. Neuropsych: This patient is capable of making decisions on his own behalf. 6. Skin/Wound Care: Routine skin checks 7. Fluids/Electrolytes/Nutrition: Routine in and outs with follow-up chemistries 8.  Atrial fibrillation.  Cardiac rate controlled.  Continue Xarelto.  Tikosyn 500 mg twice daily, Toprol-XL 25 mg daily 9.  Acute respiratory failure hypoxia related to aspiration pneumonia.  Dysphagia #3 diet.  Oncology recommended high-dose steroids x2 weeks until 08/18/2021 followed by gradual taper of prednisone as per Dr. [name].  Continue BiPAP/oxygen therapy 10.  Stage IV non-small cell lung cancer.  Palliative chemotherapy per oncology Dr. [name]. 11.  Hyperlipidemia.  Lipitor/fenofibrate 12.  Parkinson's disease.  Sinemet 13.  Acute on chronic anemia.  Follow-up CBC  I have personally performed a face to face diagnostic evaluation, including, but not limited to relevant history and physical exam findings, of this patient and developed relevant assessment and plan.  Additionally, I have reviewed and concur with the physician assistant's documentation above.  Leeroy Cha, MD  Lavon Paganini Angiulli, PA-C

## 2021-08-12 NOTE — Progress Notes (Signed)
Inpatient Rehab Admissions Coordinator:   I have a bed for this Pt. On CIR today. Transport set up for around 2:30. Rn may call report to [phone].  Clemens Catholic, [place], Day Admissions Coordinator  [phone] ([place]) [phone] (office)

## 2021-08-13 DIAGNOSIS — R5381 Other malaise: Secondary | ICD-10-CM | POA: Diagnosis not present

## 2021-08-13 LAB — COMPREHENSIVE METABOLIC PANEL
ALT: 20 U/L (ref 0–44)
AST: 25 U/L (ref 15–41)
Albumin: 2.1 g/dL — ABNORMAL LOW (ref 3.5–5.0)
Alkaline Phosphatase: 64 U/L (ref 38–126)
Anion gap: 5 (ref 5–15)
BUN: 21 mg/dL (ref 8–23)
CO2: 27 mmol/L (ref 22–32)
Calcium: 8.7 mg/dL — ABNORMAL LOW (ref 8.9–10.3)
Chloride: 103 mmol/L (ref 98–111)
Creatinine, Ser: 0.78 mg/dL (ref 0.61–1.24)
GFR, Estimated: >60 mL/min (ref >60)
Glucose, Bld: 89 mg/dL (ref 70–99)
Potassium: 3.8 mmol/L (ref 3.5–5.1)
Sodium: 135 mmol/L (ref 135–145)
Total Bilirubin: 0.8 mg/dL (ref 0.3–1.2)
Total Protein: 5.8 g/dL — ABNORMAL LOW (ref 6.5–8.1)

## 2021-08-13 LAB — CBC WITH DIFFERENTIAL/PLATELET
Abs Immature Granulocytes: 0.55 10*3/uL — ABNORMAL HIGH (ref 0.00–0.07)
Basophils Absolute: 0.1 10*3/uL (ref 0.0–0.1)
Basophils Relative: 1 %
Eosinophils Absolute: 0.1 10*3/uL (ref 0.0–0.5)
Eosinophils Relative: 1 %
HCT: 32.8 % — ABNORMAL LOW (ref 39.0–52.0)
Hemoglobin: 10.5 g/dL — ABNORMAL LOW (ref 13.0–17.0)
Immature Granulocytes: 5 %
Lymphocytes Relative: 5 %
Lymphs Abs: 0.5 10*3/uL — ABNORMAL LOW (ref 0.7–4.0)
MCH: 31.6 pg (ref 26.0–34.0)
MCHC: 32 g/dL (ref 30.0–36.0)
MCV: 98.8 fL (ref 80.0–100.0)
Monocytes Absolute: 1.3 10*3/uL — ABNORMAL HIGH (ref 0.1–1.0)
Monocytes Relative: 12 %
Neutro Abs: 8.1 10*3/uL — ABNORMAL HIGH (ref 1.7–7.7)
Neutrophils Relative %: 76 %
Platelets: 323 10*3/uL (ref 150–400)
RBC: 3.32 MIL/uL — ABNORMAL LOW (ref 4.22–5.81)
RDW: 19.2 % — ABNORMAL HIGH (ref 11.5–15.5)
WBC: 10.5 10*3/uL (ref 4.0–10.5)
nRBC: 0 % (ref 0.0–0.2)

## 2021-08-13 MED ORDER — CHLORHEXIDINE GLUCONATE CLOTH 2 % EX PADS
6.0000 | MEDICATED_PAD | Freq: Every day | CUTANEOUS | Status: DC
  Administered 2021-08-13 – 2021-08-16 (×3): 6 via TOPICAL

## 2021-08-13 MED ORDER — SODIUM CHLORIDE 0.9% FLUSH
10.0000 mL | INTRAVENOUS | Status: DC | PRN
  Administered 2021-08-19 – 2021-08-23 (×3): 10 mL

## 2021-08-13 MED ORDER — SODIUM CHLORIDE 0.9% FLUSH
10.0000 mL | Freq: Two times a day (BID) | INTRAVENOUS | Status: DC
  Administered 2021-08-13 – 2021-08-23 (×17): 10 mL

## 2021-08-13 NOTE — Progress Notes (Signed)
Inpatient Rehabilitation Care Coordinator Assessment and Plan Patient Details  Name: Corey [name] MRN: [id number] Date of Birth: Feb 20, 1946  Today's Date: 08/13/2021  Hospital Problems: Active Problems:   Debility  Past Medical History:  Past Medical History:  Diagnosis Date   Anemia    Anxiety    Arthritis    "back, right knee, hands, ankles, neck" (05/31/2016)   Carotid artery disease ([place]) 08/30/2016   Carotid US 11/19: R 1-39, L 40-59 // Carotid US 08/2019: R 1-39; L 40-59 // Carotid US 11/21: R 1-39; L 40-59>> repeat 1 year    Chronic lower back pain    Coronary atherosclerosis of native coronary artery    a. BMS to [facility] 2004 and 2007, otherwise mild nonobstructive disease. EF normal.   Diverticulitis    Dyslipidemia    Dyspnea    Essential hypertension, benign    Family history of breast cancer    Family history of lung cancer    Family history of prostate cancer    GERD (gastroesophageal reflux disease)    Lumbar radiculopathy, chronic 02/04/2015   Right L5   Obesity    OSA on CPAP    uses cpap   Paroxysmal atrial fibrillation ([place])    a. Discovered after stroke.   Pneumonia 01/1996   PONV (postoperative nausea and vomiting)    Prostate CA (HCC)    Recurrent upper respiratory infection (URI)    Stroke ([place]) 07/2012   no deficits   Visit for monitoring Tikosyn therapy 09/18/2019   Past Surgical History:  Past Surgical History:  Procedure Laterality Date   ADENOIDECTOMY     ANTERIOR CERVICAL DECOMP/DISCECTOMY FUSION  07/2001; 10/2002   "C5-6; C6-7; redo"   BACK SURGERY     CARPAL TUNNEL RELEASE Left 10/2015   CHEST TUBE INSERTION Left 03/17/2021   Procedure: INSERTION PLEURAL DRAINAGE CATHETER;  Surgeon: Garner Nash, DO;  Location: [place];  Service: Pulmonary;  Laterality: Left;  Indwelling Tunneled Pleural catheter (PLEUREX)    COLONOSCOPY W/ POLYPECTOMY  02/2014   CORONARY ANGIOPLASTY WITH STENT PLACEMENT  05/2003; 12/2005   "mid RCA; mid RCA"    FINE NEEDLE ASPIRATION  02/26/2021   Procedure: FINE NEEDLE ASPIRATION (FNA) LINEAR;  Surgeon: Candee Furbish, MD;  Location: [facility] ENDOSCOPY;  Service: Pulmonary;;   HAND SURGERY  02/2019   LEFT HAND   implantable loop recorder placement  02/27/2020   Medtronic Reveal [place] model [id number]  implantable loop recorder implanted by Dr Rayann Heman for afib management and evaluation of presyncope   IR IMAGING GUIDED PORT INSERTION  04/16/2021   JOINT REPLACEMENT     KNEE ARTHROSCOPY Left 10/2005   KNEE ARTHROSCOPY W/ PARTIAL MEDIAL MENISCECTOMY Left 09/2005   LUMBAR LAMINECTOMY/DECOMPRESSION MICRODISCECTOMY  03/2005   "L4-5"   POSTERIOR LUMBAR FUSION  10/2003   L5-S1; "plates, screws"   SHOULDER ARTHROSCOPY Right 08/2011   Debridement of labrum, arthroscopic distal clavicle excision   SHOULDER OPEN ROTATOR CUFF REPAIR Left 07/2014   TEE WITHOUT CARDIOVERSION  07/07/2012   Procedure: TRANSESOPHAGEAL ECHOCARDIOGRAM (TEE);  Surgeon: Fay Records, MD;  Location: [facility] ENDOSCOPY;  Service: Cardiovascular;  Laterality: N/A;   THORACENTESIS N/A 02/25/2021   Procedure: Mathews Robinsons;  Surgeon: Juanito Doom, MD;  Location: [place];  Service: Cardiopulmonary;  Laterality: N/A;   TONSILLECTOMY AND ADENOIDECTOMY  ~ 1956   TOTAL KNEE ARTHROPLASTY Left 10/2006   TRIGGER FINGER RELEASE Left 10/2015   VIDEO BRONCHOSCOPY WITH ENDOBRONCHIAL ULTRASOUND Left 02/26/2021  Procedure: VIDEO BRONCHOSCOPY WITH ENDOBRONCHIAL ULTRASOUND;  Surgeon: Candee Furbish, MD;  Location: [facility];  Service: Pulmonary;  Laterality: Left;  cryoprobe too thanks!   Social History:  reports that he quit smoking about 18 years ago. His smoking use included cigarettes. He has a 34.00 pack-year smoking history. He has never been exposed to tobacco smoke. He has never used smokeless tobacco. He reports that he does not drink alcohol and does not use drugs.  Family / Support Systems Marital Status: Married Patient Roles: Spouse,  Parent, Other (Comment) (sibling) Spouse/Significant Other: Renee [phone]  [phone]  [phone] Children: Two son's who are local and daughter who lives in [place] Other Supports: Two brother's who are supportive Anticipated Caregiver: Renee Ability/Limitations of Caregiver: Works part time outside of the Public house manager for [employer] Availability: Other (Comment) (Await therapy evaluations) Family Dynamics: Close knit with family and they will make sure he has what he needs at discharge. He feels grateful to have them and no concerned regarding care at [place] Preferred language: English Religion: [employer] Cultural Background: No issues Education: Librarian, academic - How often do you need to have someone help you when you read instructions, pamphlets, or other written material from your doctor or pharmacy?: Sometimes Writes: Yes Employment Status: Retired Public relations account executive Issues: No issues Guardian/Conservator: none-according to MD pt is capable of making his own decisions while here but wants wife involved if any decisions need to be made   Abuse/Neglect Abuse/Neglect Assessment Can Be Completed: Yes Physical Abuse: Denies Verbal Abuse: Denies Sexual Abuse: Denies Exploitation of patient/patient's resources: Denies Self-Neglect: Denies  Patient response to: Social Isolation - How often do you feel lonely or isolated from those around you?: Never  Emotional Status Pt's affect, behavior and adjustment status: Pt is motivated to do well and recover from his medical issues. He was doing well until got pneumonia and cellulitis. He is hopeful he will get mod/i by discharge. He is a Scientist, research (physical sciences) and hopeful he will achieve his goals Recent Psychosocial Issues: other health issues-chemo for lung cancer Psychiatric History: No history seems to be coping appropriately and able to verbalize his  concerns and feelings. Will get input from team regarding being seen by neuro-psych while here Substance Abuse History: No issues  Patient / Family Perceptions, Expectations & Goals Pt/Family understanding of illness & functional limitations: Pt and wife can explain his health issues and plan moving forward. Both talk with the MD and feel they have a good understanding of his treatment plan moving forward. Premorbid pt/family roles/activities: Husband, father, grandfather, sibling, church member, retiree, Social research officer, government Anticipated changes in roles/activities/participation: resume Pt/family expectations/goals: Pt states: " I am hopeful to do well and get at least walking with a walker by the time I leave here."  Wife states: " I am hopeful he can get back to the leve he was before all of this, I could leave him alone for fours hours at a time."  [employer]: Other (Comment) ([place]) Premorbid Home Care/DME Agencies: Other (Comment) (Active with [facility] has rw, transport chair, rollaotr CPAP) Transportation available at discharge: Wife and family Is the patient able to respond to transportation needs?: Yes In the past 12 months, has lack of transportation kept you from medical appointments or from getting medications?: No In the past 12 months, has lack of transportation kept you from meetings, work, or from getting things needed for daily living?: No  Discharge Planning Living Arrangements: Spouse/significant other Support Systems: Spouse/significant other, Children, Other relatives, Friends/neighbors, Church/faith community Type of Residence: Private residence Insurance Resources: Multimedia programmer (specify) ([place] Medicare) Financial Resources: [place], Family Support Financial Screen Referred: No Living Expenses: Own Money Management: Patient, Spouse Does the patient have any problems obtaining your medications?: No Home Management: Wife Patient/Family  Preliminary Plans: Return home with wife who has a flexible schedule and can be there if needed. She does work 20 hours per week outside of the home, but can take a leave if needed. He has supportive children and siblings who can assist also Care Coordinator Barriers to Discharge: Insurance for SNF coverage Care Coordinator Anticipated Follow Up Needs: HH/OP  Clinical Impression Pleasant gentleman who is motivated to get back to the level he was mod/I prior to this admission. He was active with [facility] and will resume this upon discharge. His wife is involved and can take a leave if needed, along with his son's and brother's. Will await team evaluations and work on a safe discharge plan.  Elease Hashimoto 08/13/2021, 10:04 AM

## 2021-08-13 NOTE — Evaluation (Signed)
Physical Therapy Assessment and Plan  Patient Details  Name: Corey [name] MRN: [id number] Date of Birth: 07-Jan-1946  PT Diagnosis: Coordination disorder, Difficulty walking, Dizziness and giddiness, Impaired sensation, and Muscle weakness Rehab Potential: Good ELOS: 2-3 weeks   Today's Date: 08/13/2021 PT Individual Time: 0900-1009 PT Individual Time Calculation (min): 69 min    Hospital Problem: Principal Problem:   Debility Active Problems:   Non-small cell carcinoma of left lung, stage 4 ([place])   Past Medical History:  Past Medical History:  Diagnosis Date   Anemia    Anxiety    Arthritis    "back, right knee, hands, ankles, neck" (05/31/2016)   Carotid artery disease ([place]) 08/30/2016   Carotid US 11/19: R 1-39, L 40-59 // Carotid US 08/2019: R 1-39; L 40-59 // Carotid US 11/21: R 1-39; L 40-59>> repeat 1 year    Chronic lower back pain    Coronary atherosclerosis of native coronary artery    a. BMS to [facility] 2004 and 2007, otherwise mild nonobstructive disease. EF normal.   Diverticulitis    Dyslipidemia    Dyspnea    Essential hypertension, benign    Family history of breast cancer    Family history of lung cancer    Family history of prostate cancer    GERD (gastroesophageal reflux disease)    Lumbar radiculopathy, chronic 02/04/2015   Right L5   Obesity    OSA on CPAP    uses cpap   Paroxysmal atrial fibrillation ([place])    a. Discovered after stroke.   Pneumonia 01/1996   PONV (postoperative nausea and vomiting)    Prostate CA (HCC)    Recurrent upper respiratory infection (URI)    Stroke ([place]) 07/2012   no deficits   Visit for monitoring Tikosyn therapy 09/18/2019   Past Surgical History:  Past Surgical History:  Procedure Laterality Date   ADENOIDECTOMY     ANTERIOR CERVICAL DECOMP/DISCECTOMY FUSION  07/2001; 10/2002   "C5-6; C6-7; redo"   BACK SURGERY     CARPAL TUNNEL RELEASE Left 10/2015   CHEST TUBE INSERTION Left 03/17/2021   Procedure: INSERTION  PLEURAL DRAINAGE CATHETER;  Surgeon: Garner Nash, DO;  Location: [place];  Service: Pulmonary;  Laterality: Left;  Indwelling Tunneled Pleural catheter (PLEUREX)    COLONOSCOPY W/ POLYPECTOMY  02/2014   CORONARY ANGIOPLASTY WITH STENT PLACEMENT  05/2003; 12/2005   "mid RCA; mid RCA"   FINE NEEDLE ASPIRATION  02/26/2021   Procedure: FINE NEEDLE ASPIRATION (FNA) LINEAR;  Surgeon: Candee Furbish, MD;  Location: [facility] ENDOSCOPY;  Service: Pulmonary;;   HAND SURGERY  02/2019   LEFT HAND   implantable loop recorder placement  02/27/2020   [employer]onic Reveal [place] model [id number] [id number]  implantable loop recorder implanted by Dr Rayann Heman for afib management and evaluation of presyncope   IR IMAGING GUIDED PORT INSERTION  04/16/2021   JOINT REPLACEMENT     KNEE ARTHROSCOPY Left 10/2005   KNEE ARTHROSCOPY W/ PARTIAL MEDIAL MENISCECTOMY Left 09/2005   LUMBAR LAMINECTOMY/DECOMPRESSION MICRODISCECTOMY  03/2005   "L4-5"   POSTERIOR LUMBAR FUSION  10/2003   L5-S1; "plates, screws"   SHOULDER ARTHROSCOPY Right 08/2011   Debridement of labrum, arthroscopic distal clavicle excision   SHOULDER OPEN ROTATOR CUFF REPAIR Left 07/2014   TEE WITHOUT CARDIOVERSION  07/07/2012   Procedure: TRANSESOPHAGEAL ECHOCARDIOGRAM (TEE);  Surgeon: Fay Records, MD;  Location: [facility] ENDOSCOPY;  Service: Cardiovascular;  Laterality: N/A;   THORACENTESIS N/A 02/25/2021   Procedure: Mathews Robinsons;  Surgeon: Juanito Doom, MD;  Location: [place];  Service: Cardiopulmonary;  Laterality: N/A;   TONSILLECTOMY AND ADENOIDECTOMY  ~ 1956   TOTAL KNEE ARTHROPLASTY Left 10/2006   TRIGGER FINGER RELEASE Left 10/2015   VIDEO BRONCHOSCOPY WITH ENDOBRONCHIAL ULTRASOUND Left 02/26/2021   Procedure: VIDEO BRONCHOSCOPY WITH ENDOBRONCHIAL ULTRASOUND;  Surgeon: Candee Furbish, MD;  Location: [facility] ENDOSCOPY;  Service: Pulmonary;  Laterality: Left;  cryoprobe too thanks!    Assessment & Plan Clinical Impression: Corey [name] is a 75 year old  right-handed male with history significant for stage IV NSCLC on palliative chemo followed by Dr. Earlie [name], prostate cancer, Parkinson's disease maintained on Sinemet, OSA, atrial fibrillation maintained on Xarelto, CAD, hyperlipidemia, obesity with BMI 27.60, quit smoking 18 years ago, chronic low back pain with radiculopathy.  Patient also recently completed course of Levaquin for pneumonia.  Per chart review patient lives with spouse.  Two-level home bed and bath upstairs.  Patient was using a cane prior to admission and had been receiving home health therapies.  Presented 07/28/2021 with left lower extremity swelling and generalized weakness with fatigue.  He reports over a 1 week.  Erythema and swelling left ankle.  Patient did develop some intractable nausea and vomiting.  Chest x-ray showed unchanged left lower lobe opacity.  CT of the chest abdomen pelvis showed new widespread patchy groundglass opacities and interlobar septal thickening throughout both lungs most prominent in the right upper lobe.  Stable central medial left lower lobe pulmonary nodule.  Stable small loculated basilar left pleural effusion.  Stable caudate lobe liver metastasis no new or progressive metastatic disease in the abdomen or pelvis.  Echocardiogram with ejection fraction of 65 to 70% no wall motion abnormalities.  Admission chemistry sodium 126 glucose 110, hemoglobin 9.0, lactic acid 0.7, blood cultures no growth to date, urinalysis negative.  Placed on IV antibiotics for left lower extremity cellulitis.  Critical care did follow-up for acute respiratory failure with hypoxia likely secondary to aspiration pneumonia versus drug-induced pneumonitis and slowly weaned from oxygen.  Noted sed rate greater than 140.  He was placed on steroids with taper.  Currently on a mechanical soft diet.  Acute on chronic anemia likely secondary to his chemotherapy no overt bleeding he was transfused 2 unit packed red blood cells 08/02/2021.   Therapy evaluations completed due to patient's decreased functional mobility was admitted for a comprehensive rehab program. He currently has no new complaints.Patient transferred to CIR on 08/12/2021 .   Patient currently requires mod with mobility secondary to muscle weakness, decreased cardiorespiratoy endurance, and decreased sitting balance, decreased standing balance, and decreased balance strategies.  Prior to hospitalization, patient was modified independent  with mobility and lived with Spouse in a House home.  Home access is 2 + 2Stairs to enter.  Patient will benefit from skilled PT intervention to maximize safe functional mobility, minimize fall risk, and decrease caregiver burden for planned discharge home with 24 hour supervision.  Anticipate patient will benefit from follow up [place] at discharge.  PT - End of Session Activity Tolerance: Tolerates < 10 min activity with changes in vital signs Endurance Deficit: Yes Endurance Deficit Description: O2 sats drop with activity, became shaky and fatigued after several minutes of activity PT Assessment Rehab Potential (ACUTE/IP ONLY): Good PT Barriers to Discharge: [place] home environment;Decreased caregiver support;Home environment access/layout;Pending chemo/radiation;New oxygen PT Patient demonstrates impairments in the following area(s): Balance;Safety;Sensory;Endurance;Motor;Skin Integrity PT Transfers Functional Problem(s): Bed Mobility;Bed to Chair;Car;Furniture PT Locomotion Functional Problem(s): Ambulation;Wheelchair Mobility;Stairs PT Plan  PT Intensity: Minimum of 1-2 x/day ,45 to 90 minutes PT Frequency: 5 out of 7 days PT Duration Estimated Length of Stay: 2-3 weeks PT Treatment/Interventions: Ambulation/gait training;Cognitive remediation/compensation;Discharge planning;DME/adaptive equipment instruction;Functional mobility training;Pain management;Psychosocial support;Therapeutic Activities;UE/LE Strength  taining/ROM;Splinting/orthotics;Balance/vestibular training;Community reintegration;Disease management/prevention;Functional electrical stimulation;Visual/perceptual remediation/compensation;Neuromuscular re-education;Patient/family education;Skin care/wound management;Stair training;Therapeutic Exercise;UE/LE Coordination activities;Wheelchair propulsion/positioning PT Transfers Anticipated Outcome(s): mod I transfers PT Locomotion Anticipated Outcome(s): supervision gait PT Recommendation Follow Up Recommendations: Home health PT Patient destination: Home Equipment Recommended: Rolling walker with 5" wheels;Wheelchair (measurements);To be determined   PT Evaluation Precautions/Restrictions Precautions Precautions: Fall Precaution Comments: monitor sats  and BP Restrictions Weight Bearing Restrictions: No  Pain Interference Pain Interference Pain Effect on Sleep: 1. Rarely or not at all Pain Interference with Therapy Activities: 1. Rarely or not at all Pain Interference with Day-to-Day Activities: 1. Rarely or not at all Home Living/Prior [place]: Spouse/significant other Available Help at Discharge: Family;Available PRN/intermittently;Friend(s);Neighbor (wife works during the day but family and neighbors available in shifts 24/7) Type of Home: House Home Access: Stairs to enter Technical brewer of Steps: 2 + 2 Entrance Stairs-Rails: None Home Layout: Two level;Bed/bath upstairs;1/2 bath on main level Alternate Level Stairs-Number of Steps: flight Alternate Level Stairs-Rails: Right;Left  Lives With: Spouse Prior Function Level of Independence: Requires assistive device for independence;Independent with gait;Independent with transfers;Needs assistance with homemaking (hurrycane at home, rollator in community)  Able to Take Stairs?: Yes Driving: Yes 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 Leisure: Hobbies-yes  (Comment) (yard work) Vision/Perception  Geologist, engineering: Within Advertising copywriter Praxis Praxis: Intact  Cognition Overall Cognitive Status: Within Functional Limits for tasks assessed Orientation Level: Oriented X4 Year: 2022 Month: November Day of Week: Correct Memory: Appears intact Awareness: Appears intact Problem Solving: Appears intact Sensation Sensation Light Touch: Impaired by gross assessment (dorsum of L foot) Hot/Cold: Appears Intact Proprioception: Appears Intact Stereognosis: Appears Intact Coordination Gross Motor Movements are Fluid and Coordinated: No Coordination and Movement Description: mildy uncoordinated d/t generalized weakness Motor  Motor Motor: Other (comment) Motor - Skilled Clinical Observations: Generalized weakness   Trunk/Postural Assessment  Cervical Assessment Cervical Assessment: Within Functional Limits Thoracic Assessment Thoracic Assessment: Within Functional Limits Lumbar Assessment Lumbar Assessment: Within Functional Limits Postural Control Postural Control: Within Functional Limits  Balance Balance Balance Assessed: Yes Static Sitting Balance Static Sitting - Balance Support: Feet supported Static Sitting - Level of Assistance: 5: Stand by assistance Dynamic Sitting Balance Dynamic Sitting - Balance Support: Feet supported Dynamic Sitting - Level of Assistance: 4: Min assist Static Standing Balance Static Standing - Balance Support: During functional activity;Bilateral upper extremity supported Static Standing - Level of Assistance: 4: Min assist Dynamic Standing Balance Dynamic Standing - Balance Support: During functional activity;Bilateral upper extremity supported Dynamic Standing - Level of Assistance: 4: Min assist Extremity Assessment      RLE Assessment RLE Assessment: Exceptions to [facility] General Strength Comments: HF 4-, grossly 4+ elsewhere LLE Assessment LLE Assessment: Exceptions to [facility] General  Strength Comments: Grossly 5/5, except HF=4/5, poor endurance  Care Tool Care Tool Bed Mobility Roll left and right activity   Roll left and right assist level: Independent with assistive device Roll left and right assistive device comment: bed rail  Sit to lying activity   Sit to lying assist level: Minimal Assistance - Patient > 75%    Lying to sitting on side of bed activity   Lying to sitting on side of bed assist level: the ability to move from lying on  the back to sitting on the side of the bed with no back support.: Contact Guard/Touching assist     Care Tool Transfers Sit to stand transfer   Sit to stand assist level: Minimal Assistance - Patient > 75%    Chair/bed transfer   Chair/bed transfer assist level: Moderate Assistance - Patient 50 - 74%     Toilet transfer   Assist Level: Moderate Assistance - Patient 50 - 74%    Car transfer Car transfer activity did not occur: Safety/medical concerns        Care Tool Locomotion Ambulation Ambulation activity did not occur: Safety/medical concerns        Walk 10 feet activity Walk 10 feet activity did not occur: Safety/medical concerns       Walk 50 feet with 2 turns activity Walk 50 feet with 2 turns activity did not occur: Safety/medical concerns      Walk 150 feet activity Walk 150 feet activity did not occur: Safety/medical concerns      Walk 10 feet on uneven surfaces activity Walk 10 feet on uneven surfaces activity did not occur: Safety/medical concerns      Stairs Stair activity did not occur: Safety/medical concerns        Walk up/down 1 step activity Walk up/down 1 step or curb (drop down) activity did not occur: Safety/medical concerns      Walk up/down 4 steps activity Walk up/down 4 steps activity did not occur: Safety/medical concerns      Walk up/down 12 steps activity Walk up/down 12 steps activity did not occur: Safety/medical concerns      Pick up small objects from floor Pick up small  object from the floor (from standing position) activity did not occur: Safety/medical concerns      Wheelchair Is the patient using a wheelchair?: Yes   Wheelchair activity did not occur: Safety/medical concerns      Wheel 50 feet with 2 turns activity Wheelchair 50 feet with 2 turns activity did not occur: Safety/medical concerns    Wheel 150 feet activity Wheelchair 150 feet activity did not occur: Safety/medical concerns      Refer to Care Plan for Long Term Goals  SHORT TERM GOAL WEEK 1 PT Short Term Goal 1 (Week 1): Pt will ambulate x 50 ft with LRAD PT Short Term Goal 2 (Week 1): Pt will initiate stair training PT Short Term Goal 3 (Week 1): Pt wil tolerate x 30 min of activity with rest breaks  Recommendations for other services: None   Skilled Therapeutic Intervention  Evaluation completed (see details above and below) with education on PT POC and goals and individual treatment initiated with focus on initiating functional mobility, assessing CLOF, and retrieving appropriate equipment. pt received in bed and agreeable to therapy. No complaint of pain. Supine>sit from flat bed with min A for trunk management, sit>supine min A for LE management. Sit to stand with min A, Stand pivot transfer x 3 with mod A. Evaluation limited because pt requested to use bathroom. Did so with Stand pivot transfer EOB<>w/c<>commode over toilet. Min A for hygiene.Strength and sensation testing performed in supine. Pt was left with all needs in reach and alarm active.   Mobility Bed Mobility Bed Mobility: Supine to Sit Supine to Sit: Minimal Assistance - Patient > 75% Transfers Transfers: Sit to Stand;Stand Pivot Transfers Sit to Stand: Minimal Assistance - Patient > 75% Stand Pivot Transfers: Moderate Assistance - Patient 50 - 74% Stand Pivot Transfer Details: Verbal cues  for sequencing;Verbal cues for precautions/safety;Verbal cues for safe use of DME/AE;Verbal cues for technique Stand Pivot  Transfer Details (indicate cue type and reason): Verbal inctruction for hand placement Transfer (Assistive device): Rolling walker Locomotion  Gait Ambulation: No Gait Gait: No Stairs / Additional Locomotion Stairs: No Wheelchair Mobility Wheelchair Mobility: No   Discharge Criteria: Patient will be discharged from PT if patient refuses treatment 3 consecutive times without medical reason, if treatment goals not met, if there is a change in medical status, if patient makes no progress towards goals or if patient is discharged from hospital.  The above assessment, treatment plan, treatment alternatives and goals were discussed and mutually agreed upon: by patient  [name] 08/13/2021, 12:46 PM

## 2021-08-13 NOTE — Progress Notes (Signed)
PT home cpap set up for use

## 2021-08-13 NOTE — Evaluation (Signed)
Speech Language Pathology Assessment and Plan  Patient Details  Name: Corey [name] MRN: [id number] Date of Birth: 1946/05/07  SLP Diagnosis: Dysphagia  Rehab Potential: Excellent ELOS: 1-2 sessions for SLP    Today's Date: 08/13/2021 SLP Individual Time: 1030-1120 SLP Individual Time Calculation (min): 50 min   Hospital Problem: Principal Problem:   Debility Active Problems:   Non-small cell carcinoma of left lung, stage 4 ([place])  Past Medical History:  Past Medical History:  Diagnosis Date   Anemia    Anxiety    Arthritis    "back, right knee, hands, ankles, neck" (05/31/2016)   Carotid artery disease ([place]) 08/30/2016   Carotid US 11/19: R 1-39, L 40-59 // Carotid US 08/2019: R 1-39; L 40-59 // Carotid US 11/21: R 1-39; L 40-59>> repeat 1 year    Chronic lower back pain    Coronary atherosclerosis of native coronary artery    a. BMS to [facility] 2004 and 2007, otherwise mild nonobstructive disease. EF normal.   Diverticulitis    Dyslipidemia    Dyspnea    Essential hypertension, benign    Family history of breast cancer    Family history of lung cancer    Family history of prostate cancer    GERD (gastroesophageal reflux disease)    Lumbar radiculopathy, chronic 02/04/2015   Right L5   Obesity    OSA on CPAP    uses cpap   Paroxysmal atrial fibrillation ([place])    a. Discovered after stroke.   Pneumonia 01/1996   PONV (postoperative nausea and vomiting)    Prostate CA (HCC)    Recurrent upper respiratory infection (URI)    Stroke ([place]) 07/2012   no deficits   Visit for monitoring Tikosyn therapy 09/18/2019   Past Surgical History:  Past Surgical History:  Procedure Laterality Date   ADENOIDECTOMY     ANTERIOR CERVICAL DECOMP/DISCECTOMY FUSION  07/2001; 10/2002   "C5-6; C6-7; redo"   BACK SURGERY     CARPAL TUNNEL RELEASE Left 10/2015   CHEST TUBE INSERTION Left 03/17/2021   Procedure: INSERTION PLEURAL DRAINAGE CATHETER;  Surgeon: Garner Nash, DO;  Location:  [place];  Service: Pulmonary;  Laterality: Left;  Indwelling Tunneled Pleural catheter (PLEUREX)    COLONOSCOPY W/ POLYPECTOMY  02/2014   CORONARY ANGIOPLASTY WITH STENT PLACEMENT  05/2003; 12/2005   "mid RCA; mid RCA"   FINE NEEDLE ASPIRATION  02/26/2021   Procedure: FINE NEEDLE ASPIRATION (FNA) LINEAR;  Surgeon: Candee Furbish, MD;  Location: [facility] ENDOSCOPY;  Service: Pulmonary;;   HAND SURGERY  02/2019   LEFT HAND   implantable loop recorder placement  02/27/2020   [employer] Reveal [place] model [id number] [id number]  implantable loop recorder implanted by Dr Rayann Heman for afib management and evaluation of presyncope   IR IMAGING GUIDED PORT INSERTION  04/16/2021   JOINT REPLACEMENT     KNEE ARTHROSCOPY Left 10/2005   KNEE ARTHROSCOPY W/ PARTIAL MEDIAL MENISCECTOMY Left 09/2005   LUMBAR LAMINECTOMY/DECOMPRESSION MICRODISCECTOMY  03/2005   "L4-5"   POSTERIOR LUMBAR FUSION  10/2003   L5-S1; "plates, screws"   SHOULDER ARTHROSCOPY Right 08/2011   Debridement of labrum, arthroscopic distal clavicle excision   SHOULDER OPEN ROTATOR CUFF REPAIR Left 07/2014   TEE WITHOUT CARDIOVERSION  07/07/2012   Procedure: TRANSESOPHAGEAL ECHOCARDIOGRAM (TEE);  Surgeon: Fay Records, MD;  Location: [facility] ENDOSCOPY;  Service: Cardiovascular;  Laterality: N/A;   THORACENTESIS N/A 02/25/2021   Procedure: Mathews Robinsons;  Surgeon: Juanito Doom, MD;  Location: [facility]  ENDOSCOPY;  Service: Cardiopulmonary;  Laterality: N/A;   TONSILLECTOMY AND ADENOIDECTOMY  ~ 1956   TOTAL KNEE ARTHROPLASTY Left 10/2006   TRIGGER FINGER RELEASE Left 10/2015   VIDEO BRONCHOSCOPY WITH ENDOBRONCHIAL ULTRASOUND Left 02/26/2021   Procedure: VIDEO BRONCHOSCOPY WITH ENDOBRONCHIAL ULTRASOUND;  Surgeon: Candee Furbish, MD;  Location: [facility] ENDOSCOPY;  Service: Pulmonary;  Laterality: Left;  cryoprobe too thanks!    Assessment / Plan / Recommendation Clinical Impression Patient is a 75 y.o. male with history of stage IV NSCLC on palliative chemo followed  by Dr. [name], prostate cancer, Parkinson's disease maintained on Sinemet, OSA, atrial fibrillation maintained on Xarelto, CAD, hyperlipidemia, obesity with BMI 27.60, quit smoking 18 years ago, chronic low back pain with radiculopathy.  Patient also recently completed course of Levaquin for pneumonia. Presented 07/28/2021 with left lower extremity swelling and generalized weakness with fatigue.  Erythema and swelling left ankle.  Patient did develop some intractable nausea and vomiting.  Chest x-ray showed unchanged left lower lobe opacity.  CT of the chest abdomen pelvis showed new widespread patchy groundglass opacities and interlobar septal thickening throughout both lungs most prominent in the right upper lobe.  Stable central medial left lower lobe pulmonary nodule.  Stable small loculated basilar left pleural effusion.  Stable caudate lobe liver metastasis no new or progressive metastatic disease in the abdomen or pelvis. Placed on IV antibiotics for left lower extremity cellulitis.  Critical care did follow-up for acute respiratory failure with hypoxia likely secondary to aspiration pneumonia versus drug-induced pneumonitis and slowly weaned from oxygen.   Currently on a mechanical soft diet.  Therapy evaluations completed due to patient's decreased functional mobility was admitted for a comprehensive rehab program 08/12/21.  Patient had a previous MBS on 10/29 which showed trace penetration of thin liquids. Patient was then placed on Dys. 3 textures for energy conservation due to severe deconditioning. Today, patient reports dislike of his food "being cut up" and requested an upgrade to regular textures. Patient observed with thin lqiudis via straw without overt s/s of aspiration. Patient also consumed regular textures with efficient mastication, complete oral clearance and without shortness of breath. Recommend patient upgrade to regular textures and remain on thin liquids. Discussed results and  recommendations with patient and wife via phone. Both verbalized understanding and agreement. SLP will f/u briefly for tolerance.    Skilled Therapeutic Interventions          Administered a BSE, please see above for details. Discussed results and recommendations with patient and wife via phone. Both verbalized understanding and agreement.   SLP Assessment  Patient will need skilled Speech Lanaguage Pathology Services during CIR admission    Recommendations  SLP Diet Recommendations: Age appropriate regular solids;Thin Liquid Administration via: Cup;Straw Medication Administration: Whole meds with liquid Supervision: Patient able to self feed Compensations: Slow rate;Small sips/bites Postural Changes and/or Swallow Maneuvers: Seated upright 90 degrees Oral Care Recommendations: Oral care BID Patient destination: Home Follow up Recommendations: None Equipment Recommended: None recommended by SLP    SLP Frequency 3 to 5 out of 7 days   SLP Duration  SLP Intensity  SLP Treatment/Interventions 1-2 sessions for SLP  Minumum of 1-2 x/day, 30 to 90 minutes  Dysphagia/aspiration precaution training;Therapeutic Activities;Environmental controls;Cueing hierarchy;Functional tasks;Patient/family education    Pain Pain Assessment Pain Scale: 0-10 Pain Score: 0-No pain  Prior Functioning Type of Home: House  Lives With: Spouse Available Help at Discharge: Family;Available PRN/intermittently;Friend(s);Neighbor Vocation: Volunteer work  [place] to hear (  with hearing aid or hearing appliances if normally used Ability to hear (with hearing aid or hearing appliances if normally used): 0. Adequate - no difficulty in normal conservation, social interaction, listening to TV   Expression of Ideas and Wants Expression of Ideas and Wants: 4. Without difficulty (complex and basic) - expresses complex messages without difficulty and with speech that is clear and easy  to understand   Understanding Verbal and Non-Verbal Content Understanding Verbal and Non-Verbal Content: 4. Understands (complex and basic) - clear comprehension without cues or repetitions  Memory/Recall Ability Memory/Recall Ability : Current season;Location of own room;Staff names and faces;That he or she is in a hospital/hospital unit    Bedside Swallowing Assessment General Date of Onset: 08/01/21 Previous Swallow Assessment: MBS 10/20: Recommended Dys. 3 textures with thin liquids Diet Prior to this Study: Dysphagia 3 (soft);Thin liquids Temperature Spikes Noted: No Respiratory Status: Supplemental O2 delivered via (comment) History of Recent Intubation: No Behavior/Cognition: Alert;Cooperative;Pleasant mood Oral Cavity - Dentition: Adequate natural dentition Self-Feeding Abilities: Able to feed self Vision: Functional for self-feeding Patient Positioning: Upright in bed Baseline Vocal Quality: Normal Volitional Cough: Strong Volitional Swallow: Able to elicit   Ice Chips Ice chips: Not tested Thin Liquid Thin Liquid: Within functional limits Presentation: Cup;Straw Nectar Thick  Not Tested  Honey Thick Honey Thick Liquid: Not tested Puree Puree: Within functional limits Presentation: Self Fed;Spoon Solid Solid: Within functional limits Presentation: Self Fed;Spoon BSE Assessment Risk for Aspiration Impact on safety and function: Mild aspiration risk Other Related Risk Factors: History of pneumonia;History of GERD;Decreased respiratory status;Deconditioning  Short Term Goals: Week 1: SLP Short Term Goal 1 (Week 1): STGs=LTGs  Refer to Care Plan for Long Term Goals  Recommendations for other services: None   Discharge Criteria: Patient will be discharged from SLP if patient refuses treatment 3 consecutive times without medical reason, if treatment goals not met, if there is a change in medical status, if patient makes no progress towards goals or if patient is  discharged from hospital.  The above assessment, treatment plan, treatment alternatives and goals were discussed and mutually agreed upon: by patient and by family  Jacquis Paxton 08/13/2021, 4:25 PM

## 2021-08-13 NOTE — Progress Notes (Signed)
Occupational Therapy Assessment and Plan  Patient Details  Name: Corey [name] MRN: [id number] Date of Birth: 1946/06/28  OT Diagnosis: muscle weakness (generalized) Rehab Potential: Rehab Potential (ACUTE ONLY): Good ELOS: 2 - 2.5 weeks   Today's Date: 08/13/2021 OT Individual Time: 1300-1400 OT Individual Time Calculation (min): 60 min     Hospital Problem: Principal Problem:   Debility Active Problems:   Non-small cell carcinoma of left lung, stage 4 ([place])   Past Medical History:  Past Medical History:  Diagnosis Date   Anemia    Anxiety    Arthritis    "back, right knee, hands, ankles, neck" (05/31/2016)   Carotid artery disease ([place]) 08/30/2016   Carotid US 11/19: R 1-39, L 40-59 // Carotid US 08/2019: R 1-39; L 40-59 // Carotid US 11/21: R 1-39; L 40-59>> repeat 1 year    Chronic lower back pain    Coronary atherosclerosis of native coronary artery    a. BMS to [facility] 2004 and 2007, otherwise mild nonobstructive disease. EF normal.   Diverticulitis    Dyslipidemia    Dyspnea    Essential hypertension, benign    Family history of breast cancer    Family history of lung cancer    Family history of prostate cancer    GERD (gastroesophageal reflux disease)    Lumbar radiculopathy, chronic 02/04/2015   Right L5   Obesity    OSA on CPAP    uses cpap   Paroxysmal atrial fibrillation ([place])    a. Discovered after stroke.   Pneumonia 01/1996   PONV (postoperative nausea and vomiting)    Prostate CA (HCC)    Recurrent upper respiratory infection (URI)    Stroke ([place]) 07/2012   no deficits   Visit for monitoring Tikosyn therapy 09/18/2019   Past Surgical History:  Past Surgical History:  Procedure Laterality Date   ADENOIDECTOMY     ANTERIOR CERVICAL DECOMP/DISCECTOMY FUSION  07/2001; 10/2002   "C5-6; C6-7; redo"   BACK SURGERY     CARPAL TUNNEL RELEASE Left 10/2015   CHEST TUBE INSERTION Left 03/17/2021   Procedure: INSERTION PLEURAL DRAINAGE CATHETER;  Surgeon:  Garner Nash, DO;  Location: [facility];  Service: Pulmonary;  Laterality: Left;  Indwelling Tunneled Pleural catheter (PLEUREX)    COLONOSCOPY W/ POLYPECTOMY  02/2014   CORONARY ANGIOPLASTY WITH STENT PLACEMENT  05/2003; 12/2005   "mid RCA; mid RCA"   FINE NEEDLE ASPIRATION  02/26/2021   Procedure: FINE NEEDLE ASPIRATION (FNA) LINEAR;  Surgeon: Candee Furbish, MD;  [facility] ENDOSCOPY;  Service: Pulmonary;;   HAND SURGERY  02/2019   LEFT HAND   implantable loop recorder placement  02/27/2020   Medtronic Reveal [place] model [id number] [id number]  implantable loop recorder implanted by Dr Rayann Heman for afib management and evaluation of presyncope   IR IMAGING GUIDED PORT INSERTION  04/16/2021   JOINT REPLACEMENT     KNEE ARTHROSCOPY Left 10/2005   KNEE ARTHROSCOPY W/ PARTIAL MEDIAL MENISCECTOMY Left 09/2005   LUMBAR LAMINECTOMY/DECOMPRESSION MICRODISCECTOMY  03/2005   "L4-5"   POSTERIOR LUMBAR FUSION  10/2003   L5-S1; "plates, screws"   SHOULDER ARTHROSCOPY Right 08/2011   Debridement of labrum, arthroscopic distal clavicle excision   SHOULDER OPEN ROTATOR CUFF REPAIR Left 07/2014   TEE WITHOUT CARDIOVERSION  07/07/2012   Procedure: TRANSESOPHAGEAL ECHOCARDIOGRAM (TEE);  Surgeon: Fay Records, MD;  Location: [facility] ENDOSCOPY;  Service: Cardiovascular;  Laterality: N/A;   THORACENTESIS N/A 02/25/2021   Procedure: Mathews Robinsons;  Surgeon: Lake Bells,  Ronie Spies, MD;  Location[facility] ENDOSCOPY;  Service: Cardiopulmonary;  Laterality: N/A;   TONSILLECTOMY AND ADENOIDECTOMY  ~ 1956   TOTAL KNEE ARTHROPLASTY Left 10/2006   TRIGGER FINGER RELEASE Left 10/2015   VIDEO BRONCHOSCOPY WITH ENDOBRONCHIAL ULTRASOUND Left 02/26/2021   Procedure: VIDEO BRONCHOSCOPY WITH ENDOBRONCHIAL ULTRASOUND;  Surgeon: Candee Furbish, MD;  Location: [facility] ENDOSCOPY;  Service: Pulmonary;  Laterality: Left;  cryoprobe too thanks!    Assessment & Plan Clinical Impression: zamarian scarano is a 75 year old right-handed male with history  significant for stage IV NSCLC on palliative chemo followed by Dr. [name], prostate cancer, Parkinson's disease maintained on Sinemet, OSA, atrial fibrillation maintained on Xarelto, CAD, hyperlipidemia, obesity with BMI 27.60, quit smoking 18 years ago, chronic low back pain with radiculopathy.  Patient also recently completed course of Levaquin for pneumonia.  Per chart review patient lives with spouse.  Two-level home bed and bath upstairs.  Patient was using a cane prior to admission and had been receiving home health therapies.  Presented 07/28/2021 with left lower extremity swelling and generalized weakness with fatigue.  He reports over a 1 week.  Erythema and swelling left ankle.  Patient did develop some intractable nausea and vomiting.  Chest x-ray showed unchanged left lower lobe opacity.  CT of the chest abdomen pelvis showed new widespread patchy groundglass opacities and interlobar septal thickening throughout both lungs most prominent in the right upper lobe.  Stable central medial left lower lobe pulmonary nodule.  Stable small loculated basilar left pleural effusion.  Stable caudate lobe liver metastasis no new or progressive metastatic disease in the abdomen or pelvis.  Echocardiogram with ejection fraction of 65 to 70% no wall motion abnormalities.  Admission chemistry sodium 126 glucose 110, hemoglobin 9.0, lactic acid 0.7, blood cultures no growth to date, urinalysis negative.  Placed on IV antibiotics for left lower extremity cellulitis.  Critical care did follow-up for acute respiratory failure with hypoxia likely secondary to aspiration pneumonia versus drug-induced pneumonitis and slowly weaned from oxygen.  Noted sed rate greater than 140.  He was placed on steroids with taper.  Currently on a mechanical soft diet.  Acute on chronic anemia likely secondary to his chemotherapy no overt bleeding he was transfused 2 unit packed red blood cells 08/02/2021.  Patient transferred to CIR on  08/12/2021 .    Patient currently requires min-mod A with basic self-care skills secondary to muscle weakness, decreased cardiorespiratoy endurance and decreased oxygen support, and decreased standing balance, decreased postural control, and decreased balance strategies.  Prior to hospitalization, patient could complete BADLs with min A. Per pt report, wife helps with getting dressed d/t FM deficits.   Patient will benefit from skilled intervention to decrease level of assist with basic self-care skills, increase independence with basic self-care skills, and increase level of independence with iADL prior to discharge home with care partner.  Anticipate patient will require 24 hour supervision and follow up home health.  OT - End of Session Activity Tolerance: Tolerates < 10 min activity with changes in vital signs Endurance Deficit: Yes Endurance Deficit Description: O2 sats drop with activity, became shaky and fatigued after several minutes of activity OT Assessment Rehab Potential (ACUTE ONLY): Good OT Barriers to Discharge: Inaccessible home environment;Home environment access/layout;New oxygen;Decreased caregiver support OT Patient demonstrates impairments in the following area(s): Balance;Endurance;Motor;Vision OT Basic ADL's Functional Problem(s): Grooming;Bathing;Dressing;Toileting OT Transfers Functional Problem(s): Tub/Shower;Toilet OT Additional Impairment(s): None OT Plan OT Intensity: Minimum of 1-2 x/day, 45 to 90 minutes OT  Frequency: Total of 15 hours over 7 days of combined therapies OT Duration/Estimated Length of Stay: 2 - 2.5 weeks OT Treatment/Interventions: Balance/vestibular training;Community reintegration;Discharge planning;Disease mangement/prevention;DME/adaptive equipment instruction;Functional mobility training;Pain management;Patient/family education;Self Care/advanced ADL retraining;Psychosocial support;Therapeutic Activities;UE/LE Strength taining/ROM;Therapeutic  Exercise;UE/LE Coordination activities;Visual/perceptual remediation/compensation OT Self Feeding Anticipated Outcome(s): Supervision OT Basic Self-Care Anticipated Outcome(s): Supervision OT Toileting Anticipated Outcome(s): Supervision OT Bathroom Transfers Anticipated Outcome(s): Supervision OT Recommendation Patient destination: Home Follow Up Recommendations: Home health OT Equipment Recommended: 3 in 1 bedside comode   OT Evaluation Precautions/Restrictions  Precautions Precautions: Fall Precaution Comments: monitor sats  and BP Restrictions Weight Bearing Restrictions: No General Chart Reviewed: Yes Family/Caregiver Present: No Vital Signs  02 on 3L when OT arrived 92% prior to activity EOB 84% EOB on 3L after sit <> stand to don pants over hips 89% EOB after ~40 seconds of pursed lip breathing Pain Pain Assessment Pain Scale: 0-10 Pain Score: 0-No pain Home Living/Prior Functioning Home Living Family/patient expects to be discharged to:: Private residence Living Arrangements: Spouse/significant other Available Help at Discharge: Family, Available PRN/intermittently, Friend(s), Neighbor Type of Home: House Home Access: Stairs to enter Technical brewer of Steps: 2 + 2 Entrance Stairs-Rails: None Home Layout: Two level, Bed/bath upstairs, 1/2 bath on main level Alternate Level Stairs-Number of Steps: flight Alternate Level Stairs-Rails: Right, Left Bathroom Shower/Tub: Chiropodist: Handicapped height Bathroom Accessibility: No Additional Comments: Pt no longer with Life Alert system  Lives With: Spouse IADL History Homemaking Responsibilities: Yes Meal Prep Responsibility: Secondary Laundry Responsibility: Secondary Cleaning Responsibility: Secondary Bill Paying/Finance Responsibility: Secondary Shopping Responsibility: Secondary Child Care Responsibility: No Homemaking Comments:  (pt and wife share household duties. 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 Leisure: Hobbies-yes (Comment) Vision Baseline Vision/History: 1 Wears glasses Ability to See in Adequate Light: 1 Impaired Patient Visual Report: Blurring of vision (pt reports blurring of vision in R eye with dryness) Vision Assessment?: Vision impaired- to be further tested in functional context Additional Comments:  (Pt reports blurry vision R eye, able to read clock and name tag with glasses on) Perception  Perception: Within Functional Limits Praxis Praxis: Intact Cognition Overall Cognitive Status: Within Functional Limits for tasks assessed (pt reported that cognition decreases d/t fatigue at baseline. He reports he is aware, and takes additional time to remember things.) Arousal/Alertness: Awake/alert Orientation Level: Person;Place;Situation Person: Oriented Place: Oriented Situation: Oriented Year: 2022 Month: November Day of Week: Correct Memory: Impaired (pt reports STM deficits - could not remember BIMS words immediately) Memory Impairment: Decreased short term memory Decreased Short Term Memory: Verbal basic Immediate Memory Recall: Sock;Blue;Bed Memory Recall Sock: Not able to recall Memory Recall Blue: Without Cue Memory Recall Bed: Without Cue Attention: Focused Focused Attention: Appears intact Awareness: Appears intact Problem Solving: Appears intact Safety/Judgment: Appears intact (pt aware of activity tolerance  deficits and is able to verbalize safety precautions taken at home and in community) Comments: overall pt with good awareness of functional status and has strategies for cognitive STM Sensation Sensation Light Touch: Impaired by gross assessment Hot/Cold: Appears Intact Proprioception: Appears  Intact Stereognosis: Appears Intact Coordination Gross Motor Movements are Fluid and Coordinated: No Fine Motor Movements are Fluid and Coordinated: No (Tremors present B hands from prior CVA. Pt reports issues with FM such as buttoning pants and shirts) Coordination and Movement Description: mildy uncoordinated d/t generalized weakness Finger Nose Finger Test: tremors in B hands present from previous CVA 9 Hole Peg Test:  (NT - to be tested when time allows) Motor  Motor Motor: Other (comment) Motor - Skilled Clinical Observations: Generalized weakness  Trunk/Postural Assessment  Cervical Assessment Cervical Assessment: Exceptions to [facility] (forward head) Thoracic Assessment Thoracic Assessment: Within Functional Limits Lumbar Assessment Lumbar Assessment: Within Functional Limits Postural Control Postural Control: Deficits on evaluation (general weakness)  Balance Balance Balance Assessed: Yes Static Sitting Balance Static Sitting - Balance Support: Feet supported Static Sitting - Level of Assistance: 5: Stand by assistance Dynamic Sitting Balance Dynamic Sitting - Balance Support: Feet supported Dynamic Sitting - Level of Assistance: 5: Stand by assistance Dynamic Sitting - Balance Activities: Reaching across midline;Reaching for objects Extremity/Trunk Assessment RUE Assessment RUE Assessment: Exceptions to [facility] General Strength Comments: generalized weakness LUE Assessment LUE Assessment: Exceptions to [facility] General Strength Comments: generalized weakness  Care Tool Care Tool Self Care Eating   Eating Assist Level: Set up assist    Oral Care    Oral Care Assist Level: Set up  assist    Bathing   Body parts bathed by patient: Right arm;Left arm;Abdomen;Chest;Right upper leg;Left upper leg;Face   Body parts n/a: Left lower leg;Right lower leg;Buttocks;Front perineal area (peri care performed earlier) Assist Level: Moderate Assistance - Patient 50 - 74%    Upper Body Dressing(including orthotics)   What is the patient wearing?: Pull over shirt   Assist Level: Set up assist    Lower Body Dressing (excluding footwear)   What is the patient wearing?: Incontinence brief;Pants Assist for lower body dressing: Moderate Assistance - Patient 50 - 74%    Putting on/Taking off footwear   What is the patient wearing?: Non-skid slipper socks Assist for footwear: Total Assistance - Patient < 25%       Care Tool Toileting Toileting activity   Assist for toileting: Moderate Assistance - Patient 50 - 74%     Care Tool Bed Mobility Roll left and right activity   Roll left and right assist level: Independent with assistive device Roll left and right assistive device comment: bed rail  Sit to lying activity   Sit to lying assist level: Contact Guard/Touching assist    Lying to sitting on side of bed activity   Lying to sitting on side of bed assist level: the ability to move from lying on the back to sitting on the side of the bed with no back support.: Contact Guard/Touching assist     Care Tool Transfers Sit to stand transfer   Sit to stand assist level: Minimal Assistance - Patient > 75%    Chair/bed transfer   Chair/bed transfer assist level: Moderate Assistance - Patient 50 - 74%     Toilet transfer   Assist Level: Moderate Assistance - Patient 50 - 74%     Care Tool Cognition  Expression of Ideas and Wants Expression of Ideas and Wants: 3. Some difficulty - exhibits some difficulty with expressing needs and ideas (e.g, some words or finishing thoughts) or speech is not clear  Understanding Verbal and Non-Verbal Content Understanding Verbal and  Non-Verbal Content: 3. Usually understands - understands most conversations, but misses some part/intent of  message. Requires cues at times to understand   Memory/Recall Ability Memory/Recall Ability : Current season;That he or she is in a hospital/hospital unit   Refer to Care Plan for [place] 1 OT Short Term Goal 1 (Week 1): Pt will complete transfer to [facility] with LRAD MIN A OT Short Term Goal 2 (Week 1): Pt will don/doff pants min A OT Short Term Goal 3 (Week 1): Pt will perform grooming standing at sink with min A OT Short Term Goal 4 (Week 1): Pt will complete toileting tasks with LRAD and min A  Recommendations for other services: None    Skilled Therapeutic Intervention Pt educated on OT role/purpose, CIR, ELOS, and recovery process. Pt familiar with OT/PT as he has received therapy before. Pt pleasant and motivated overall during OT eval, and listed his personal goals as performing toileting as independently as possible. He would like to gain enough endurance to be able to walk to the bathroom instead of using urinal. See above & below for full details of eval. Trevar is currently setup for UB bathing/grooming, MOD for LB and MIN A for sit <> stands using RW. 02 saturation monitored during eval, frequent rest breaks provided and pt received education on breathing strategies. Sats dropped to 84% on 3L with sit <> stand, but returned back to 90% with rest break. Pt with good awareness of limitations and describes strategies to help with STM issues. He reports increased fatigue in afternoons. Pt left in bed with HOB slightly elevated, call bell in hand, needs met and bed alarm on     ADL ADL Equipment Provided: Other (comment) (Pt has used AE previously with success) Eating: Set up Where Assessed-Eating: Bed level Grooming: Setup Where Assessed-Grooming: Edge of bed Upper Body Bathing: Setup Where Assessed-Upper Body Bathing: Edge of bed Lower Body Bathing:  Moderate assistance Where Assessed-Lower Body Bathing: Edge of bed Upper Body Dressing: Setup Where Assessed-Upper Body Dressing: Edge of bed Lower Body Dressing: Moderate assistance Where Assessed-Lower Body Dressing: Edge of bed Toileting: Moderate assistance Where Assessed-Toileting: Bedside Commode Toilet Transfer: Minimal verbal cueing;Moderate assistance Toilet Transfer Method: Stand pivot Toilet Transfer Equipment: Radiographer, therapeutic: Moderate assistance Tub/Shower Transfer Method: Stand pivot Tub/Shower Equipment: Transfer tub bench;Grab bars Social research officer, government: Moderate assistance Social research officer, government Method: Brewing technologist ADL Comments:  (Extra time needed for BADLs d/t fatigue, O2 sats and rest breaks) Mobility  Bed Mobility Bed Mobility: Supine to Sit Supine to Sit: Contact Guard/Touching assist Transfers Sit to Stand: Minimal Assistance - Patient > 75%   Discharge Criteria: Patient will be discharged from OT if patient refuses treatment 3 consecutive times without medical reason, if treatment goals not met, if there is a change in medical status, if patient makes no progress towards goals or if patient is discharged from hospital.  The above assessment, treatment plan, treatment alternatives and goals were discussed and mutually agreed upon: by patient  [facility] 08/13/2021, 2:38 PM

## 2021-08-13 NOTE — Progress Notes (Signed)
[place] Individual Statement of Services  Patient Name:  Corey [name]  Date:  08/13/2021  Welcome to the [facility].  Our goal is to provide you with an individualized program based on your diagnosis and situation, designed to meet your specific needs.  With this comprehensive rehabilitation program, you will be expected to participate in at least 3 hours of rehabilitation therapies Monday-Friday, with modified therapy programming on the weekends.  Your rehabilitation program will include the following services:  Physical Therapy (PT), Occupational Therapy (OT), Speech Therapy (ST), 24 hour per day rehabilitation nursing, Therapeutic Recreaction (TR), Care Coordinator, Rehabilitation Medicine, Nutrition Services, and Pharmacy Services  Weekly team conferences will be held on Tuesday to discuss your progress.  Your Inpatient Rehabilitation Care Coordinator will talk with you frequently to get your input and to update you on team discussions.  Team conferences with you and your family in attendance may also be held.  Expected length of stay: 2-3 weeks  Overall anticipated outcome: Supervision-mod/I level  Depending on your progress and recovery, your program may change. Your Inpatient Rehabilitation Care Coordinator will coordinate services and will keep you informed of any changes. Your Inpatient Rehabilitation Care Coordinator's name and contact numbers are listed  below.  The following services may also be recommended but are not provided by the [facility]:   [place] will be made to provide these services after discharge if needed.  Arrangements include referral to agencies that provide these services.  Your insurance has been verified to be:  [place] primary doctor is:  Diabas koirala  Pertinent information will be shared with your  doctor and your insurance company.  Inpatient Rehabilitation Care Coordinator:  Ovidio Kin, [place] or Emilia Beck  Information discussed with and copy given to patient by: Elease Hashimoto, 08/13/2021, 10:05 AM

## 2021-08-13 NOTE — Progress Notes (Signed)
Inpatient Rehabilitation Admission Medication Review by a Pharmacist  A complete drug regimen review was completed for this patient to identify any potential clinically significant medication issues.  High Risk Drug Classes Is patient taking? Indication by Medication  Antipsychotic No   Anticoagulant Yes Xarelto for Afib  Antibiotic No   Opioid Yes Tramadol for pain  Antiplatelet No   Hypoglycemics/insulin No   Vasoactive Medication Yes Tikosyn, metoprolol for Afib  Chemotherapy No   Other Yes Sinemet for Parkinson's Flomax for BPH Dapsone, nebs for ILD / High dose prednisone for cancer, ARF Lipitor, fenofibrate for HLD Topamax for moood     Type of Medication Issue Identified Description of Issue Recommendation(s)  Drug Interaction(s) (clinically significant)     Duplicate Therapy     Allergy     No Medication Administration End Date     Incorrect Dose     Additional Drug Therapy Needed     Significant med changes from prior encounter (inform family/care partners about these prior to discharge).    Other       Clinically significant medication issues were identified that warrant physician communication and completion of prescribed/recommended actions by midnight of the next day:  No  Pharmacist comments: Watch for prednisone taper  Time spent performing this drug regimen review (minutes):  20 minutes   Tad Moore 08/13/2021 7:55 AM

## 2021-08-13 NOTE — Discharge Instructions (Addendum)
Inpatient Rehab Discharge Instructions  Corey [name] Discharge date and time: No discharge date for patient encounter.   Activities/Precautions/ Functional Status: Activity: activity as tolerated Diet: Soft Wound Care: Routine skin checks Functional status:  ___ No restrictions     ___ Walk up steps independently ___ 24/7 supervision/assistance   ___ Walk up steps with assistance ___ Intermittent supervision/assistance  ___ Bathe/dress independently ___ Walk with walker     _x__ Bathe/dress with assistance ___ Walk Independently    ___ Shower independently ___ Walk with assistance    ___ Shower with assistance ___ No alcohol     ___ Return to work/school ________  Special Instructions: No driving smoking or alcohol  Continue oxygen therapy and nebulizer treatments as directed   COMMUNITY REFERRALS UPON DISCHARGE:    Home Health:   PT  OT  RN                 Agency: [place], [place]  [phone]    My questions have been answered and I understand these instructions. I will adhere to these goals and the provided educational materials after my discharge from the hospital.  Patient/Caregiver Signature _______________________________ Date __________  Clinician Signature _______________________________________ Date __________  Please bring this form and your medication list with you to all your follow-up doctor's appointments.

## 2021-08-13 NOTE — Plan of Care (Signed)
  Problem: RH Balance Goal: LTG Patient will maintain dynamic standing with ADLs (OT) Description: LTG:  Patient will maintain dynamic standing balance with assist during activities of daily living (OT)  Flowsheets (Taken 08/13/2021 1448) LTG: Pt will maintain dynamic standing balance during ADLs with: Supervision/Verbal cueing   Problem: Sit to Stand Goal: LTG:  Patient will perform sit to stand in prep for activites of daily living with assistance level (OT) Description: LTG:  Patient will perform sit to stand in prep for activites of daily living with assistance level (OT) Flowsheets (Taken 08/13/2021 1448) LTG: PT will perform sit to stand in prep for activites of daily living with assistance level: Supervision/Verbal cueing   Problem: RH Grooming Goal: LTG Patient will perform grooming w/assist,cues/equip (OT) Description: LTG: Patient will perform grooming with assist, with/without cues using equipment (OT) Flowsheets (Taken 08/13/2021 1448) LTG: Pt will perform grooming with assistance level of: Supervision/Verbal cueing   Problem: RH Bathing Goal: LTG Patient will bathe all body parts with assist levels (OT) Description: LTG: Patient will bathe all body parts with assist levels (OT) Flowsheets (Taken 08/13/2021 1448) LTG: Pt will perform bathing with assistance level/cueing: Supervision/Verbal cueing   Problem: RH Dressing Goal: LTG Patient will perform upper body dressing (OT) Description: LTG Patient will perform upper body dressing with assist, with/without cues (OT). Flowsheets (Taken 08/13/2021 1448) LTG: Pt will perform upper body dressing with assistance level of: Supervision/Verbal cueing Goal: LTG Patient will perform lower body dressing w/assist (OT) Description: LTG: Patient will perform lower body dressing with assist, with/without cues in positioning using equipment (OT) Flowsheets (Taken 08/13/2021 1448) LTG: Pt will perform lower body dressing with assistance  level of: Supervision/Verbal cueing   Problem: RH Toileting Goal: LTG Patient will perform toileting task (3/3 steps) with assistance level (OT) Description: LTG: Patient will perform toileting task (3/3 steps) with assistance level (OT)  Flowsheets (Taken 08/13/2021 1448) LTG: Pt will perform toileting task (3/3 steps) with assistance level: Supervision/Verbal cueing   Problem: RH Toilet Transfers Goal: LTG Patient will perform toilet transfers w/assist (OT) Description: LTG: Patient will perform toilet transfers with assist, with/without cues using equipment (OT) Flowsheets (Taken 08/13/2021 1448) LTG: Pt will perform toilet transfers with assistance level of: Supervision/Verbal cueing   Problem: RH Tub/Shower Transfers Goal: LTG Patient will perform tub/shower transfers w/assist (OT) Description: LTG: Patient will perform tub/shower transfers with assist, with/without cues using equipment (OT) Flowsheets (Taken 08/13/2021 1448) LTG: Pt will perform tub/shower stall transfers with assistance level of: Supervision/Verbal cueing

## 2021-08-13 NOTE — Progress Notes (Signed)
Occupational Therapy Session Note  Patient Details  Name: Corey [name] MRN: [id number] Date of Birth: 03-Feb-1946  Today's Date: 08/14/2021 OT Individual Time: 1655-3748 OT Individual Time Calculation (min): 44 min    Short Term Goals: Week 1:  OT Short Term Goal 1 (Week 1): Pt will complete transfer to [facility] with LRAD MIN A OT Short Term Goal 2 (Week 1): Pt will don/doff pants min A OT Short Term Goal 3 (Week 1): Pt will perform grooming standing at sink with min A OT Short Term Goal 4 (Week 1): Pt will complete toileting tasks with LRAD and min A  Skilled Therapeutic Interventions/Progress Updates:    Pt greeted in the recliner with no c/o pain, satting at 97% on 2L. ADL needs presently met. He was agreeable to first work on his stands. He completed 2 sit<stands with CGA and min cues using the RW. Education provided on using visualization strategies before power up and while standing in order to increase parasympathetic activity and minimize breath holding. Education provided on forward posture breathing in between stands, pt provided with several minutes to rest before 2nd stand. Note 02 sats dropped to 88% and then 83% post stands, able to rebound to ~92% in less than 30 seconds. Continued session with UB strengthening/endurance focus using 2# bar x5 reps 3 sets, education emphasis placed on coordinating breath with movement and taking ample rest in between sets. 02 sats when assessed 92-93%, pt satting at 93% at time of departure. Left him with all needs within reach and chair alarm set.   Therapy Documentation Precautions:  Precautions Precautions: Fall Precaution Comments: monitor sats  and BP Restrictions Weight Bearing Restrictions: No Vital Signs: Therapy Vitals Temp: 97.8 F (36.6 C) Temp Source: Oral Pulse Rate: 76 Resp: 17 BP: (!) 132/91 Patient Position (if appropriate): Lying Oxygen Therapy SpO2: 95 % O2 Device: Nasal Cannula O2 Flow Rate (L/min): 4  L/min Pain: Pain Assessment Pain Scale: 0-10 Pain Score: 0-No pain ADL: ADL Equipment Provided: Other (comment) (Pt has used AE previously with success) Eating: Set up Where Assessed-Eating: Bed level Grooming: Setup Where Assessed-Grooming: Edge of bed Upper Body Bathing: Setup Where Assessed-Upper Body Bathing: Edge of bed Lower Body Bathing: Moderate assistance Where Assessed-Lower Body Bathing: Edge of bed Upper Body Dressing: Setup Where Assessed-Upper Body Dressing: Edge of bed Lower Body Dressing: Moderate assistance Where Assessed-Lower Body Dressing: Edge of bed Toileting: Moderate assistance Where Assessed-Toileting: Bedside Commode Toilet Transfer: Minimal verbal cueing, Moderate assistance Toilet Transfer Method: Stand pivot Toilet Transfer Equipment: Bedside commode Tub/Shower Transfer: Moderate assistance Tub/Shower Transfer Method: Stand pivot Tub/Shower Equipment: Radio broadcast assistant, Energy manager: Moderate assistance Social research officer, government Method: Brewing technologist ADL Comments:  (Extra time needed for BADLs d/t fatigue, O2 sats and rest breaks)  Praxis    Therapy/Group: Individual Therapy  Acasia Skilton A Copelyn Widmer 08/14/2021, 12:22 PM

## 2021-08-13 NOTE — Progress Notes (Signed)
Inpatient Rehabilitation  Patient information reviewed and entered into eRehab system by Taveon Enyeart Brielle Moro, OTR/L.   Information including medical coding, functional ability and quality indicators will be reviewed and updated through discharge.    

## 2021-08-13 NOTE — Progress Notes (Signed)
PROGRESS NOTE   Subjective/Complaints:   Pt reports had terrible night- CPAP- had to use the hospital CPAP and was "too low" on autotitration.   No pain- but feels very weak all over.  Using urinal- but sometimes hard to use.   LBM yesterday  ROS:  Pt denies SOB, abd pain, CP, N/V/C/D, and vision changes   Objective:   No results found. Recent Labs    08/12/21 0332 08/13/21 0550  WBC 8.3 10.5  HGB 10.8* 10.5*  HCT 33.7* 32.8*  PLT 319 323   Recent Labs    08/12/21 0332 08/13/21 0550  NA 136 135  K 3.5 3.8  CL 103 103  CO2 26 27  GLUCOSE 100* 89  BUN 23 21  CREATININE 0.72 0.78  CALCIUM 8.7* 8.7*    Intake/Output Summary (Last 24 hours) at 08/13/2021 1021 Last data filed at 08/13/2021 0900 Gross per 24 hour  Intake 240 ml  Output 1100 ml  Net -860 ml        Physical Exam: Vital Signs Blood pressure 129/80, pulse 87, temperature 98 F (36.7 C), temperature source Oral, resp. rate 18, height 6' 1.5" (1.867 m), weight 96.2 kg, SpO2 94 %.   General: awake, alert, appropriate, NAD HENT: conjugate gaze; oropharynx moist CV: regular rate; no JVD Pulmonary: CTA B/L; no W/R/R- good air movement- decreased at bases B/L - no O2 GI: soft, NT, ND, (+)BS Psychiatric: appropriate Neurological: Ox3 MS: Strength 4+/5 proximally in Ue's and LE's and 5-/5 distally.    Assessment/Plan: 1. Functional deficits which require 3+ hours per day of interdisciplinary therapy in a comprehensive inpatient rehab setting. Physiatrist is providing close team supervision and 24 hour management of active medical problems listed below. Physiatrist and rehab team continue to assess barriers to discharge/monitor patient progress toward functional and medical goals  Care Tool:  Bathing              Bathing assist       Upper Body Dressing/Undressing Upper body dressing   What is the patient wearing?: Hospital  gown only    Upper body assist Assist Level: Set up assist    Lower Body Dressing/Undressing Lower body dressing            Lower body assist       Toileting Toileting    Toileting assist Assist for toileting: Moderate Assistance - Patient 50 - 74%     Transfers Chair/bed transfer  Transfers assist     Chair/bed transfer assist level: 2 Helpers     Locomotion Ambulation   Ambulation assist              Walk 10 feet activity   Assist           Walk 50 feet activity   Assist           Walk 150 feet activity   Assist           Walk 10 feet on uneven surface  activity   Assist           Wheelchair     Assist  Wheelchair 50 feet with 2 turns activity    Assist            Wheelchair 150 feet activity     Assist          Blood pressure 129/80, pulse 87, temperature 98 F (36.7 C), temperature source Oral, resp. rate 18, height 6' 1.5" (1.867 m), weight 96.2 kg, SpO2 94 %.  Medical Problem List and Plan: 1.  Debility secondary to left lower extremity cellulitis complicated by acute respiratory failure/pneumonia/multi medical             -patient may shower             -ELOS/Goals: 2-3 weeks S-MinA             -Admit to CIR  [place] day of evaluations of PT and OT-  2.  Antithrombotics: -DVT/anticoagulation:  Pharmaceutical: Xarelto.  Vascular ultrasound lower extremities negative             -antiplatelet therapy: N/A. 3. Pain: Continue Topamax 100 mg twice daily, Tramadol as needed  11/10- no pain right now- con't regimen prn 4. Anxiety: Continue Xanax 0.5 mg nightly as needed             -antipsychotic agents: N/A 5. Neuropsych: This patient is capable of making decisions on his own behalf. 6. Skin/Wound Care: Routine skin checks 7. Fluids/Electrolytes/Nutrition: Routine in and outs with follow-up chemistries 8.  Atrial fibrillation.  Cardiac rate controlled.  Continue Xarelto.   Tikosyn 500 mg twice daily, Toprol-XL 25 mg daily 9.  Acute respiratory failure hypoxia related to aspiration pneumonia.  Dysphagia #3 diet.  Oncology recommended high-dose steroids x2 weeks until 08/18/2021 followed by gradual taper of prednisone as per Dr. [name].  Continue BiPAP/oxygen therapy  11/10- will d/w resp therapy since cannot use his home BiPAP from lacking 1 piece to attach O2- will see if Resp therapy can help with hospital one.  10.  Stage IV non-small cell lung cancer.  Palliative chemotherapy per oncology Dr. [name]. 11.  Hyperlipidemia.  Lipitor/fenofibrate 12.  Parkinson's disease.  Sinemet 13.  Acute on chronic anemia.  Follow-up CBC  11/10- Hb stable at 10.5- wil con't to monitor      LOS: 1 days A FACE TO FACE EVALUATION WAS PERFORMED  Corey [name] 08/13/2021, 10:21 AM

## 2021-08-14 ENCOUNTER — Inpatient Hospital Stay: Payer: Medicare PPO | Admitting: Primary Care

## 2021-08-14 DIAGNOSIS — R5381 Other malaise: Secondary | ICD-10-CM | POA: Diagnosis not present
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
## 2021-08-14 NOTE — Progress Notes (Signed)
PROGRESS NOTE   Subjective/Complaints:   Slept a little better- but still not great with CPAP.  Tired with therapy but expected that.  Has hardware in neck and back  and feels like needs to be propped up with a wedge when eating, etc.   Also having more of a cough- only taking tessalon pearls 1x/day- ordered 3x/day.     ROS:  Pt denies SOB, abd pain, CP, N/V/C/D, and vision changes    Objective:   No results found. Recent Labs    08/12/21 0332 08/13/21 0550  WBC 8.3 10.5  HGB 10.8* 10.5*  HCT 33.7* 32.8*  PLT 319 323   Recent Labs    08/12/21 0332 08/13/21 0550  NA 136 135  K 3.5 3.8  CL 103 103  CO2 26 27  GLUCOSE 100* 89  BUN 23 21  CREATININE 0.72 0.78  CALCIUM 8.7* 8.7*    Intake/Output Summary (Last 24 hours) at 08/14/2021 0810 Last data filed at 08/13/2021 1700 Gross per 24 hour  Intake 440 ml  Output 250 ml  Net 190 ml        Physical Exam: Vital Signs Blood pressure 118/76, pulse 90, temperature 98 F (36.7 C), temperature source Oral, resp. rate 17, height 6' 1.5" (1.867 m), weight 96.2 kg, SpO2 91 %.    General: awake, alert, appropriate, sitting up in bed; eating breakfast; NAD HENT: conjugate gaze; oropharynx moist CV: regular rate; no JVD Pulmonary: slightly coarse; no wheezes; rare rhonchi; no rales; adequate air movement- coughed when there- wasn't wheezy.  GI: soft, NT, ND, (+)BS Psychiatric: appropriate but irritable Neurological: Ox3 MS: Strength 4+/5 proximally in Ue's and LE's and 5-/5 distally.    Assessment/Plan: 1. Functional deficits which require 3+ hours per day of interdisciplinary therapy in a comprehensive inpatient rehab setting. Physiatrist is providing close team supervision and 24 hour management of active medical problems listed below. Physiatrist and rehab team continue to assess barriers to discharge/monitor patient progress toward functional and  medical goals  Care Tool:  Bathing    Body parts bathed by patient: Right arm, Left arm, Abdomen, Chest, Right upper leg, Left upper leg, Face     Body parts n/a: Left lower leg, Right lower leg, Buttocks, Front perineal area (peri care performed earlier)   Bathing assist Assist Level: Moderate Assistance - Patient 50 - 74%     Upper Body Dressing/Undressing Upper body dressing   What is the patient wearing?: Pull over shirt    Upper body assist Assist Level: Set up assist    Lower Body Dressing/Undressing Lower body dressing      What is the patient wearing?: Incontinence brief     Lower body assist Assist for lower body dressing: Moderate Assistance - Patient 50 - 74%     Toileting Toileting    Toileting assist Assist for toileting: Moderate Assistance - Patient 50 - 74%     Transfers Chair/bed transfer  Transfers assist     Chair/bed transfer assist level: Moderate Assistance - Patient 50 - 74%     Locomotion Ambulation   Ambulation assist   Ambulation activity did not occur: Safety/medical concerns  Walk 10 feet activity   Assist  Walk 10 feet activity did not occur: Safety/medical concerns        Walk 50 feet activity   Assist Walk 50 feet with 2 turns activity did not occur: Safety/medical concerns         Walk 150 feet activity   Assist Walk 150 feet activity did not occur: Safety/medical concerns         Walk 10 feet on uneven surface  activity   Assist Walk 10 feet on uneven surfaces activity did not occur: Safety/medical concerns         Wheelchair     Assist Is the patient using a wheelchair?: Yes   Wheelchair activity did not occur: Safety/medical concerns         Wheelchair 50 feet with 2 turns activity    Assist    Wheelchair 50 feet with 2 turns activity did not occur: Safety/medical concerns       Wheelchair 150 feet activity     Assist  Wheelchair 150 feet activity did  not occur: Safety/medical concerns       Blood pressure 118/76, pulse 90, temperature 98 F (36.7 C), temperature source Oral, resp. rate 17, height 6' 1.5" (1.867 m), weight 96.2 kg, SpO2 91 %.  Medical Problem List and Plan: 1.  Debility secondary to left lower extremity cellulitis complicated by acute respiratory failure/pneumonia/multi medical             -patient may shower             -ELOS/Goals: 2-3 weeks S-MinA             -Admit to CIR  Con't PT and OT- CIR 2.  Antithrombotics: -DVT/anticoagulation:  Pharmaceutical: Xarelto.  Vascular ultrasound lower extremities negative             -antiplatelet therapy: N/A. 3. Pain: Continue Topamax 100 mg twice daily, Tramadol as needed  11/10- no pain right now- con't regimen prn  11/11- having back pain from previous fusions in neck/back - con't tramadol prn- also will ask therapy for wedge for propping.  4. Anxiety: Continue Xanax 0.5 mg nightly as needed             -antipsychotic agents: N/A 5. Neuropsych: This patient is capable of making decisions on his own behalf. 6. Skin/Wound Care: Routine skin checks 7. Fluids/Electrolytes/Nutrition: Routine in and outs with follow-up chemistries 8.  Atrial fibrillation.  Cardiac rate controlled.  Continue Xarelto.  Tikosyn 500 mg twice daily, Toprol-XL 25 mg daily  11/11- rate controlled- con't regimen 9.  Acute respiratory failure hypoxia related to aspiration pneumonia.  Dysphagia #3 diet.  Oncology recommended high-dose steroids x2 weeks until 08/18/2021 followed by gradual taper of prednisone as per Dr. [name].  Continue BiPAP/oxygen therapy  11/10- will d/w resp therapy since cannot use his home BiPAP from lacking 1 piece to attach O2- will see if Resp therapy can help with hospital one.   11/11- has slightly more cough- encouraged pt to take tessalon pearls TID- is ordered 10.  Stage IV non-small cell lung cancer.  Palliative chemotherapy per oncology Dr. [name]. 11.   Hyperlipidemia.  Lipitor/fenofibrate 12.  Parkinson's disease.  Sinemet 13.  Acute on chronic anemia.  Follow-up CBC  11/10- Hb stable at 10.5- wil con't to monitor      LOS: 2 days A FACE TO FACE EVALUATION WAS PERFORMED  Meridee Branum 08/14/2021, 8:10 AM

## 2021-08-14 NOTE — Progress Notes (Signed)
Speech Language Pathology Daily Session Note  Patient Details  Name: Corey [name] MRN: [id number] Date of Birth: 11-Dec-1945  Today's Date: 08/14/2021 SLP Individual Time: 1400-1445 SLP Individual Time Calculation (min): 45 min  Short Term Goals: Week 1: SLP Short Term Goal 1 (Week 1): STGs=LTGs  Skilled Therapeutic Interventions:  Pt was seen for skilled ST targeting dysphagia goals.  Upon arrival, pt was awake, alert, and agreeable to participating in therapy although he requested to remain in bed due to an episode prior to lunch this afternoon where he became dizzy, light headed, nauseated, and his blood pressure became elevated.  Pt consumed a functional snack of regular textures and thin liquids with no overt s/s of aspiration and mod I use of swallowing precautions.  He endorses good toleration of diet advancement with no coughing or choking on regular textures and thin liquids.  I would suggest x1 additional ST treatment session, preferably with a meal prior to d/c from [place].  Pt was left in bed with bed alarm set and call bell within reach.  Continue per current plan of care.    Pain Pain Assessment Pain Scale: 0-10 Pain Score: 0-No pain  Therapy/Group: Individual Therapy  Harlean Regula, Selinda Orion 08/14/2021, 4:42 PM

## 2021-08-14 NOTE — Progress Notes (Signed)
Pt called stating he was feeling dizzy, light headed, and nauseated. Pt was stating he has had previous episodes of the onset dizziness, light headedness, and bouts of nausea over the last 6 months. This was the worst episode so far he has had. Stated he felt like he needed to have a bowel movement, but felt to weak at the moment to use the walker for the [facility]. This nurse, and another nurse assisted pt to the [facility] using the stedy. Pt began to have an small episode of emesis before sitting pt on BSC. Pt urinated in [place], no BM just bouts of gas. Pt put back in bed, given Zofran. Vitals taken upon being assisted back to bed. Pt is A/OX4, in bed resting, no pain reported. PA Dan made aware.   Dayna Ramus

## 2021-08-15 ENCOUNTER — Inpatient Hospital Stay (HOSPITAL_COMMUNITY): Payer: Medicare PPO

## 2021-08-15 MED ORDER — BENZONATATE 100 MG PO CAPS
200.0000 mg | ORAL_CAPSULE | Freq: Three times a day (TID) | ORAL | Status: DC
  Administered 2021-08-15 – 2021-08-29 (×42): 200 mg via ORAL
  Filled 2021-08-15 (×42): qty 2

## 2021-08-15 NOTE — IPOC Note (Signed)
Overall Plan of Care [facility]) Patient Details Name: Corey [name] MRN: [id number] DOB: [birth year]  Admitting Diagnosis: [facility] Problems: Principal Problem:   Debility Active Problems:   Non-small cell carcinoma of left lung, stage 4 ([place])     Functional Problem List: Nursing Bladder, Edema, Endurance, Medication Management, Perception, Safety, Sensory, Skin Integrity  PT Balance, Safety, Sensory, Endurance, Motor, Skin Integrity  OT Balance, Endurance, Motor, Vision  SLP Nutrition  TR         Basic ADL's: OT Grooming, Bathing, Dressing, Toileting     Advanced  ADL's: OT       Transfers: PT Bed Mobility, Bed to Chair, Car, Patent attorney, Agricultural engineer: PT Ambulation, Emergency planning/management officer, Stairs     Additional Impairments: OT None  SLP Swallowing      TR      Anticipated Outcomes Item Anticipated Outcome  Self Feeding Supervision  Swallowing  Mod I   Basic self-care  Media planner Transfers Supervision  Bowel/Bladder  min assist  Transfers  mod I transfers  Locomotion  supervision gait  Communication     Cognition     Pain  n/a  Safety/Judgment  min assist and no falls   Therapy Plan: PT Intensity: Minimum of 1-2 x/day ,45 to 90 minutes PT Frequency: 5 out of 7 days PT Duration Estimated Length of Stay: 2-3 weeks OT Intensity: Minimum of 1-2 x/day, 45 to 90 minutes OT Frequency: Total of 15 hours over 7 days of combined therapies OT Duration/Estimated Length of Stay: 2 - 2.5 weeks SLP Intensity: Minumum of 1-2 x/day, 30 to 90 minutes SLP Frequency: 3 to 5 out of 7 days SLP Duration/Estimated Length of Stay: 1-2 sessions for SLP   Due to the current state of emergency, patients may not be receiving their 3-hours of Medicare-mandated therapy.   Team Interventions: Nursing Interventions Patient/Family Education, Bladder Management, Disease Management/Prevention, Pain Management,  Medication Management, Skin Care/Wound Management, Discharge Planning  PT interventions Ambulation/gait training, Cognitive remediation/compensation, Discharge planning, DME/adaptive equipment instruction, Functional mobility training, Pain management, Psychosocial support, Therapeutic Activities, UE/LE Strength taining/ROM, Splinting/orthotics, Training and development officer, Community reintegration, Disease management/prevention, Functional electrical stimulation, Visual/perceptual remediation/compensation, Neuromuscular re-education, Patient/family education, Skin care/wound management, Stair training, Therapeutic Exercise, UE/LE Coordination activities, Wheelchair propulsion/positioning  OT Interventions Training and development officer, Academic librarian, Discharge planning, Disease mangement/prevention, Engineer, drilling, Functional mobility training, Pain management, Patient/family education, Self Care/advanced ADL retraining, Psychosocial support, Therapeutic Activities, UE/LE Strength taining/ROM, Therapeutic Exercise, UE/LE Coordination activities, Visual/perceptual remediation/compensation  SLP Interventions Dysphagia/aspiration precaution training, Therapeutic Activities, Environmental controls, Cueing hierarchy, Functional tasks, Patient/family education  TR Interventions    SW/CM Interventions Discharge Planning, Psychosocial Support, Patient/Family Education   Barriers to Discharge MD  Medical stability, Home enviroment access/loayout, Incontinence, Wound care, Weight, and New oxygen  Nursing Decreased caregiver support, Home environment access/layout, Incontinence, Lack of/limited family support, Weight, Medication compliance Lives in 2 level home with 2 steps to enter. No rails. Bedroom/bathroom upstairs, half bath main level. Full flight of stairs to 2nd level with left and right rails. Lives with spouse who can provide min assist at discharge.  PT Inaccessible home  environment, Decreased caregiver support, Home environment access/layout, Pending chemo/radiation, New oxygen    OT Inaccessible home environment, Home environment access/layout, New oxygen, Decreased caregiver support    SLP      SW Insurance for SNF coverage     Team Discharge Planning: Destination: PT-Home ,OT- Home ,  SLP-Home Projected Follow-up: PT-Home health PT, OT-  Home health OT, SLP-None Projected Equipment Needs: PT-Rolling walker with 5" wheels, Wheelchair (measurements), To be determined, OT- 3 in 1 bedside comode, SLP-None recommended by SLP Equipment Details: PT- , OT-  Patient/family involved in discharge planning: PT- Patient,  OT-Patient, SLP-Patient, Family member/caregiver  MD ELOS: 2-3 weeks Medical Rehab Prognosis:  Good Assessment: Pt is a 75 yr old male with Debility due to LLE cellulitis and acute resp failure/pneumonia on BiPAP at home- with Stage IV lung CA and Afib; and Parkinson's disease- is DNR;  Concerned about pt's increased cough- will check F/U CXR PA and lateral and change bipap orders for pt.   Goals supervision to min A    See Team Conference Notes for weekly updates to the plan of care

## 2021-08-15 NOTE — Plan of Care (Signed)
  Problem: Consults Goal: RH GENERAL PATIENT EDUCATION Description: See Patient Education module for education specifics. Outcome: Progressing   Problem: RH BLADDER ELIMINATION Goal: RH STG MANAGE BLADDER WITH ASSISTANCE Description: STG Manage Bladder With Min Assistance Outcome: Progressing Goal: RH STG MANAGE BLADDER WITH MEDICATION WITH ASSISTANCE Description: STG Manage Bladder With Medication With [place]. Outcome: Progressing   Problem: RH SKIN INTEGRITY Goal: RH STG MAINTAIN SKIN INTEGRITY WITH ASSISTANCE Description: STG Maintain Skin Integrity With [place]. Outcome: Progressing   Problem: RH SAFETY Goal: RH STG ADHERE TO SAFETY PRECAUTIONS W/ASSISTANCE/DEVICE Description: STG Adhere to Safety Precautions With Cues and Reminders. Outcome: Progressing   Problem: RH KNOWLEDGE DEFICIT GENERAL Goal: RH STG INCREASE KNOWLEDGE OF SELF CARE AFTER HOSPITALIZATION Description: Patient will demonstrate knowledge of medication management, bladder management with educational materials and handouts provided by staff independently at discharge. Outcome: Progressing

## 2021-08-15 NOTE — Progress Notes (Signed)
Occupational Therapy Session Note  Patient Details  Name: Corey [name] MRN: [id number] Date of Birth: Jul 28, 1946  Today's Date: 08/16/2021 OT Individual Time: [phone] and [id number] OT Individual Time Calculation (min): 69 min and 28 min  Short Term Goals: Week 1:  OT Short Term Goal 1 (Week 1): Pt will complete transfer to [facility] with LRAD MIN A OT Short Term Goal 2 (Week 1): Pt will don/doff pants min A OT Short Term Goal 3 (Week 1): Pt will perform grooming standing at sink with min A OT Short Term Goal 4 (Week 1): Pt will complete toileting tasks with LRAD and min A  Skilled Therapeutic Interventions/Progress Updates:    Pt greeted in bed, taking morning medication from RN. He was agreeable to engage in BADLs today EOB at sit<stand level with RW. 02 sats on 2.5L at start of session 97%. Note that sats lowered to 92% after completing UB bathing/dressing with min cues for 02 mgt when donning clean shirt. He used the LH sponge to wash feet given instruction and Min A. Before completing pericare, pt wanted to use the restroom. Min A for ambulatory transfer to the elevated toilet using RW, pt at this time placed on 3L 02. Note that Teds were donned prior to transfer. Pt did well with mindfully breathing in stance. Pt able to complete 3/3 components of toileting tasks given Min balance assistance in standing, B+B void. Used the reacher to don new pants as well. 02 sats post bathroom activity 89-92%. He needed a prolonged rest break before ambulating again. Pt then transferred to the recliner. He remained sitting up at close of session, all needs within reach and chair alarm set, satting at 93% after hooked back to room 02 set at 2.5L.Tx focus placed on adaptive self care skills, functional transfers, and maintaining stable vitals during functional activity.   2nd Session 1:1 tx (32 min) Pt greeted in the recliner with no c/o pain. Reporting some orthostatics during earlier PT session, resolved at this  time though pt reports consistent "mild" dizziness. Started with LB dressing skills to increase his functional independence. OT introduced the sock aide and educated him on use. Pt able to use device successfully with supervision but also able to obtain/maintain figure 4 position bilaterally to meet task demands! We celebrated! Pt also able to doff/don sneakers while using figure 4 position including tying shoelaces. He already has a reacher + long handled shoe horn at home in case he needs them. Discussed benefits of sock aide for energy conservation purposes however pt reported less exertion donning socks without equipment. 02 sats 94% on 2.5L and BP 113/75 after seated activity. Pt already has a TTB for shower at home and an elevated toilet, might be able to access bathroom using the RW if he enters sideways. He would benefit from a home measurement sheet. Pt hopeful to be using the rollator in the near future as this is his device of preference at home. He was left in care of NT for toilet transfer.   Therapy Documentation Precautions:  Precautions Precautions: Fall Precaution Comments: monitor sats  and BP Restrictions Weight Bearing Restrictions: No  Pain: HA, pt reported being premedicated at start of 1st session by RN Pain Assessment Pain Scale: 0-10 Pain Score: 0-No pain ADL: ADL Equipment Provided: Other (comment) (Pt has used AE previously with success) Eating: Set up Where Assessed-Eating: Bed level Grooming: Setup Where Assessed-Grooming: Edge of bed Upper Body Bathing: Setup Where Assessed-Upper Body Bathing: Edge of  bed Lower Body Bathing: Moderate assistance Where Assessed-Lower Body Bathing: Edge of bed Upper Body Dressing: Setup Where Assessed-Upper Body Dressing: Edge of bed Lower Body Dressing: Moderate assistance Where Assessed-Lower Body Dressing: Edge of bed Toileting: Moderate assistance Where Assessed-Toileting: Bedside Commode Toilet Transfer: Minimal verbal  cueing, Moderate assistance Toilet Transfer Method: Stand pivot Toilet Transfer Equipment: Bedside commode Tub/Shower Transfer: Moderate assistance Tub/Shower Transfer Method: Stand pivot Tub/Shower Equipment: Radio broadcast assistant, Energy manager: Moderate assistance Social research officer, government Method: Brewing technologist ADL Comments:  (Extra time needed for BADLs d/t fatigue, O2 sats and rest breaks)  Therapy/Group: Individual Therapy  Era Parr A Daviana Haymaker 08/16/2021, 12:35 PM

## 2021-08-15 NOTE — Progress Notes (Signed)
PROGRESS NOTE   Subjective/Complaints:   Sleeping poorly- found out doesn't have bleed in- will change order and get him BiPAP Spoke with resp therapy and nursing.   Also wet self overnight- upset about this- appropriately.  Took tesselon pearls 3x but still cough is worse.  "Coughed all night".   ROS:  Pt denies SOB, abd pain, CP, N/V/C/D, and vision changes But has cough!  Objective:   No results found. Recent Labs    08/13/21 0550  WBC 10.5  HGB 10.5*  HCT 32.8*  PLT 323   Recent Labs    08/13/21 0550  NA 135  K 3.8  CL 103  CO2 27  GLUCOSE 89  BUN 21  CREATININE 0.78  CALCIUM 8.7*    Intake/Output Summary (Last 24 hours) at 08/15/2021 7169 Last data filed at 08/15/2021 0700 Gross per 24 hour  Intake 250 ml  Output 875 ml  Net -625 ml        Physical Exam: Vital Signs Blood pressure 131/78, pulse 90, temperature 99.5 F (37.5 C), temperature source Oral, resp. rate 18, height 6' 1.5" (1.867 m), weight 96.2 kg, SpO2 97 %.     General: awake, alert, appropriate, irritable about being wet- appropriately; NAD HENT: conjugate gaze; oropharynx moist CV: regular rate; no JVD Pulmonary: CTA B/L; no W/R/R- good air movement- a little coarse, decreased at bases B/L  GI: soft, NT, ND, (+)BS Psychiatric: appropriate but irritable about  being wet/accident Neurological: alert  MS: Strength 4+/5 proximally in Ue's and LE's and 5-/5 distally.    Assessment/Plan: 1. Functional deficits which require 3+ hours per day of interdisciplinary therapy in a comprehensive inpatient rehab setting. Physiatrist is providing close team supervision and 24 hour management of active medical problems listed below. Physiatrist and rehab team continue to assess barriers to discharge/monitor patient progress toward functional and medical goals  Care Tool:  Bathing    Body parts bathed by patient: Right arm, Left  arm, Abdomen, Chest, Right upper leg, Left upper leg, Face     Body parts n/a: Left lower leg, Right lower leg, Buttocks, Front perineal area (peri care performed earlier)   Bathing assist Assist Level: Moderate Assistance - Patient 50 - 74%     Upper Body Dressing/Undressing Upper body dressing   What is the patient wearing?: Pull over shirt    Upper body assist Assist Level: Set up assist    Lower Body Dressing/Undressing Lower body dressing      What is the patient wearing?: Incontinence brief     Lower body assist Assist for lower body dressing: Moderate Assistance - Patient 50 - 74%     Toileting Toileting    Toileting assist Assist for toileting: Moderate Assistance - Patient 50 - 74%     Transfers Chair/bed transfer  Transfers assist     Chair/bed transfer assist level: Moderate Assistance - Patient 50 - 74%     Locomotion Ambulation   Ambulation assist   Ambulation activity did not occur: Safety/medical concerns          Walk 10 feet activity   Assist  Walk 10 feet activity did not occur: Safety/medical concerns  Walk 50 feet activity   Assist Walk 50 feet with 2 turns activity did not occur: Safety/medical concerns         Walk 150 feet activity   Assist Walk 150 feet activity did not occur: Safety/medical concerns         Walk 10 feet on uneven surface  activity   Assist Walk 10 feet on uneven surfaces activity did not occur: Safety/medical concerns         Wheelchair     Assist Is the patient using a wheelchair?: Yes Type of Wheelchair: Manual Wheelchair activity did not occur: Safety/medical concerns         Wheelchair 50 feet with 2 turns activity    Assist    Wheelchair 50 feet with 2 turns activity did not occur: Safety/medical concerns       Wheelchair 150 feet activity     Assist  Wheelchair 150 feet activity did not occur: Safety/medical concerns       Blood pressure  131/78, pulse 90, temperature 99.5 F (37.5 C), temperature source Oral, resp. rate 18, height 6' 1.5" (1.867 m), weight 96.2 kg, SpO2 97 %.  Medical Problem List and Plan: 1.  Debility secondary to left lower extremity cellulitis complicated by acute respiratory failure/pneumonia/multi medical             -patient may shower             -ELOS/Goals: 2-3 weeks S-MinA             -Admit to CIR  Con't PT and OT/CIR 2.  Antithrombotics: -DVT/anticoagulation:  Pharmaceutical: Xarelto.  Vascular ultrasound lower extremities negative             -antiplatelet therapy: N/A. 3. Pain: Continue Topamax 100 mg twice daily, Tramadol as needed  11/10- no pain right now- con't regimen prn  11/11- having back pain from previous fusions in neck/back - con't tramadol prn- also will ask therapy for wedge for propping.  4. Anxiety: Continue Xanax 0.5 mg nightly as needed             -antipsychotic agents: N/A 5. Neuropsych: This patient is capable of making decisions on his own behalf. 6. Skin/Wound Care: Routine skin checks 7. Fluids/Electrolytes/Nutrition: Routine in and outs with follow-up chemistries 8.  Atrial fibrillation.  Cardiac rate controlled.  Continue Xarelto.  Tikosyn 500 mg twice daily, Toprol-XL 25 mg daily  11/11- rate controlled- con't regimen 9.  Acute respiratory failure hypoxia related to aspiration pneumonia.  Dysphagia #3 diet.  Oncology recommended high-dose steroids x2 weeks until 08/18/2021 followed by gradual taper of prednisone as per Dr. [name].  Continue BiPAP/oxygen therapy  11/10- will d/w resp therapy since cannot use his home BiPAP from lacking 1 piece to attach O2- will see if Resp therapy can help with hospital one.   11/11- has slightly more cough- encouraged pt to take tessalon pearls TID- is ordered  11/12- will check another PA/Lateral CXR and schedule tessalon pearls- and see what else might be required, based on CXR; will add 2L bleed in to BiPAP and see how that  goes.  10.  Stage IV non-small cell lung cancer.  Palliative chemotherapy per oncology Dr. [name]. 11.  Hyperlipidemia.  Lipitor/fenofibrate 12.  Parkinson's disease.  Sinemet 13.  Acute on chronic anemia.  Follow-up CBC  11/10- Hb stable at 10.5- wil con't to monitor    I spent a total of 39 minutes on total care >50% coordination of care-  speaking with nursing about BiPAP and resp therapy as well- also d/w pt plan.     LOS: 3 days A FACE TO FACE EVALUATION WAS PERFORMED  Davan Hark 08/15/2021, 9:22 AM

## 2021-08-15 NOTE — Progress Notes (Signed)
Physical Therapy Session Note  Patient Details  Name: Corey [name] MRN: [id number] Date of Birth: June 29, 1946  Today's Date: 08/15/2021 PT Individual Time: 1100-1200 PT Individual Time Calculation (min): 60 min   Short Term Goals: Week 1:  PT Short Term Goal 1 (Week 1): Pt will ambulate x 50 ft with LRAD PT Short Term Goal 2 (Week 1): Pt will initiate stair training PT Short Term Goal 3 (Week 1): Pt wil tolerate x 30 min of activity with rest breaks  Skilled Therapeutic Interventions/Progress Updates: Attempted PT session but pt going for chest XR.  PT moved to another pt, but will return this AM for rx.  Pt returned from XR and PT able to complete full session.  Pt present supine in bed but very eager to participate w/ therapy.  Pt transfers sup to sit w/ CGA.  Pt transfers sit to stand from elevated bed w/ min A (bed elevated to personal bed height).  Pt steps x 4' to w/c, and O2 decreased to 83% on 2 LPM, increased O2 to 3 LPM and increased to 93% w/in 2 min.  Pt wheeled to small gym for car transfer.  Pt amb 12' w/ RW and min A to SUV height simulated car w/ O2 decreasing to 83%, O2 increased to 4 LPM, O2 improved to >90% w/in 1 min.  Pt able to negotiate w/c w/ BUEs through doorway and down hallways up to 70' w/ supervision, but decreased O2 sats to 83% requiring another seated rest break .  Pt amb x 15' into BR and able to manage clothing and brief.  Pt continent of urine in toilet, NT notified.  Pt required new brief total A by PT, but mod A for pulling up clothing.  Pt amb to sink and performed hand cleaning and then sat in w/c for rest.  Pt transferred w/c > recliner w/ min A and RW.  Seat alarm on and all needs in reach.  Pt left on 4 LPM to wall and NT will have RN decrease to 2 LPM when O2 sats improve.     Therapy Documentation Precautions:  Precautions Precautions: Fall Precaution Comments: monitor sats  and BP Restrictions Weight Bearing Restrictions: No General:   Vital  Signs: Therapy Vitals Pulse Rate: 90 Resp: 18 Patient Position (if appropriate): Lying Oxygen Therapy SpO2: 97 % O2 Device: Nasal Cannula O2 Flow Rate (L/min): 2 L/min FiO2 (%): 28 % Pain:0/10 Pain Assessment Pain Scale: 0-10 Pain Score: 0-No pain Mobility:       Therapy/Group: Individual Therapy  Ladoris Gene 08/15/2021, 12:06 PM

## 2021-08-15 NOTE — Progress Notes (Signed)
RT placed patient on is home unit. 3L bleed in needed. Patient tolerating well at this time.

## 2021-08-16 LAB — CBC WITH DIFFERENTIAL/PLATELET
Abs Immature Granulocytes: 0.49 10*3/uL — ABNORMAL HIGH (ref 0.00–0.07)
Basophils Absolute: 0.1 10*3/uL (ref 0.0–0.1)
Basophils Relative: 1 %
Eosinophils Absolute: 0 10*3/uL (ref 0.0–0.5)
Eosinophils Relative: 0 %
HCT: 32.4 % — ABNORMAL LOW (ref 39.0–52.0)
Hemoglobin: 10.1 g/dL — ABNORMAL LOW (ref 13.0–17.0)
Immature Granulocytes: 5 %
Lymphocytes Relative: 4 %
Lymphs Abs: 0.4 10*3/uL — ABNORMAL LOW (ref 0.7–4.0)
MCH: 31.7 pg (ref 26.0–34.0)
MCHC: 31.2 g/dL (ref 30.0–36.0)
MCV: 101.6 fL — ABNORMAL HIGH (ref 80.0–100.0)
Monocytes Absolute: 0.9 10*3/uL (ref 0.1–1.0)
Monocytes Relative: 9 %
Neutro Abs: 8.2 10*3/uL — ABNORMAL HIGH (ref 1.7–7.7)
Neutrophils Relative %: 81 %
Platelets: 310 10*3/uL (ref 150–400)
RBC: 3.19 MIL/uL — ABNORMAL LOW (ref 4.22–5.81)
RDW: 19.8 % — ABNORMAL HIGH (ref 11.5–15.5)
WBC: 10.1 10*3/uL (ref 4.0–10.5)
nRBC: 0 % (ref 0.0–0.2)

## 2021-08-16 LAB — BASIC METABOLIC PANEL
Anion gap: 7 (ref 5–15)
BUN: 21 mg/dL (ref 8–23)
CO2: 26 mmol/L (ref 22–32)
Calcium: 8.8 mg/dL — ABNORMAL LOW (ref 8.9–10.3)
Chloride: 104 mmol/L (ref 98–111)
Creatinine, Ser: 0.83 mg/dL (ref 0.61–1.24)
GFR, Estimated: >60 mL/min (ref >60)
Glucose, Bld: 105 mg/dL — ABNORMAL HIGH (ref 70–99)
Potassium: 3.8 mmol/L (ref 3.5–5.1)
Sodium: 137 mmol/L (ref 135–145)

## 2021-08-16 NOTE — Progress Notes (Signed)
Physical Therapy Session Note  Patient Details  Name: [name] MRN: [id number] Date of Birth: 27-Mar-1946  Today's Date: 08/16/2021 PT Individual Time: 1100-1200 PT Individual Time Calculation (min): 60 min   Short Term Goals: Week 1:  PT Short Term Goal 1 (Week 1): Pt will ambulate x 50 ft with LRAD PT Short Term Goal 2 (Week 1): Pt will initiate stair training PT Short Term Goal 3 (Week 1): Pt wil tolerate x 30 min of activity with rest breaks  Skilled Therapeutic Interventions/Progress Updates:    Session focused on overall functional mobility including transfers and gait and overall activity tolerance/upright tolerance. Pt with orthostasis (symptomatic) today that was limiting upright mobility as well as generalized fatigue. See BP values below and RN was made aware as well. Pt willing to work through to tolerance but anytime increasing upright standing mobility, symptomatic again. Pt overall CGA to min assist for transfers with RW throughout session including lower surfaces of mat table and recliner with cues for anterior weightshift form lower surface. CGA once up in standing for stand step transfers or short distance gait. Pt able to gait x 15' max with min assist and shuffled gait pattern noted as well as decreased step length and flexed posture. Seated EOM while BP values stabilizing performed deep breathing exercises including changes in position for opening up through pectorals/chest and upright seated posture for improved depth of breath. Education provided throughout session. End of session returned to recliner with wife at bedside (reviewed what occurred during our session as well as his earlier sessions per her asking).   Therapy Documentation Precautions:  Precautions Precautions: Fall Precaution Comments: monitor sats  and BP Restrictions Weight Bearing Restrictions: No  Vital Signs: 92% at rest on 2.5L in room with HR = 97 bpm   88% = O2 after gait (3L) and 105 bpm  HR, c/o lightheadedness  Pulse Rate: 91 BP: 123/82 SpO2: 94 %  95/64 mmHg; 103 bpm = HR in standing and increased c/o dizziness  102/68 mmHg; 89 bpm seated after about 5 min of rest 97/65 mmhHg; 91 bpm seated after another few min.   Pain: Pain Assessment Pain Scale: 0-10 Pain Score: 0-No pain     Therapy/Group: Individual Therapy  Canary Brim Ivory Broad, PT, DPT, CBIS  08/16/2021, 12:16 PM

## 2021-08-16 NOTE — Progress Notes (Signed)
RT placed patient on home CPAP at this time with 3L O2 bled into circuit. Patient tolerating CPAP well at this time. RT will monitor as needed.

## 2021-08-16 NOTE — Plan of Care (Signed)
  Problem: Consults Goal: RH GENERAL PATIENT EDUCATION Description: See Patient Education module for education specifics. Outcome: Progressing   Problem: RH BLADDER ELIMINATION Goal: RH STG MANAGE BLADDER WITH ASSISTANCE Description: STG Manage Bladder With Min Assistance Outcome: Progressing Goal: RH STG MANAGE BLADDER WITH MEDICATION WITH ASSISTANCE Description: STG Manage Bladder With Medication With [place]. Outcome: Progressing   Problem: RH SKIN INTEGRITY Goal: RH STG MAINTAIN SKIN INTEGRITY WITH ASSISTANCE Description: STG Maintain Skin Integrity With [place]. Outcome: Progressing   Problem: RH SAFETY Goal: RH STG ADHERE TO SAFETY PRECAUTIONS W/ASSISTANCE/DEVICE Description: STG Adhere to Safety Precautions With Cues and Reminders. Outcome: Progressing   Problem: RH KNOWLEDGE DEFICIT GENERAL Goal: RH STG INCREASE KNOWLEDGE OF SELF CARE AFTER HOSPITALIZATION Description: Patient will demonstrate knowledge of medication management, bladder management with educational materials and handouts provided by staff independently at discharge. Outcome: Progressing

## 2021-08-17 ENCOUNTER — Ambulatory Visit (INDEPENDENT_AMBULATORY_CARE_PROVIDER_SITE_OTHER): Payer: Medicare PPO

## 2021-08-17 DIAGNOSIS — R55 Syncope and collapse: Secondary | ICD-10-CM | POA: Diagnosis not present

## 2021-08-17 MED ORDER — TRAZODONE HCL 50 MG PO TABS
50.0000 mg | ORAL_TABLET | Freq: Every day | ORAL | Status: DC
  Administered 2021-08-17 – 2021-08-28 (×12): 50 mg via ORAL
  Filled 2021-08-17 (×12): qty 1

## 2021-08-17 MED ORDER — CHLORHEXIDINE GLUCONATE CLOTH 2 % EX PADS
6.0000 | MEDICATED_PAD | Freq: Every day | CUTANEOUS | Status: DC
  Administered 2021-08-17 – 2021-08-24 (×10): 6 via TOPICAL

## 2021-08-17 NOTE — Progress Notes (Signed)
PROGRESS NOTE   Subjective/Complaints:   Pt reports has started to ask for nebs prn- sleeping a little better with BIPAP , but still having restless nights- can't turn mind off- anxious per pt.  Takes Xanax at night, but hasn't really taken trazodone.   Had LBM o/n.     ROS:  Pt denies SOB, abd pain, CP, N/V/C/D, and vision changes  Objective:   DG Chest 2 View  Result Date: 08/15/2021 CLINICAL DATA:  Pneumonia. EXAM: CHEST - 2 VIEW COMPARISON:  Previous studies including the examination of 08/03/2021 FINDINGS: Transverse diameter of heart is increased. There are ground-glass and patchy alveolar infiltrates in both lungs, more so on the left side. There is significant interval decrease in ground-glass infiltrates in right lung with small residual foci. There is also decrease in alveolar infiltrates in left mid and left lower lung fields. There is blunting of left lateral CP angle. There is no pneumothorax. IMPRESSION: Cardiomegaly. There is interval decrease in the ground-glass and alveolar infiltrates in both lungs. Small residual foci of ground-glass densities seen in the right lung. Still, there is significant alveolar infiltrate in the left mid and left lower lung fields suggesting pneumonia. Possible small left pleural effusion. Electronically Signed   By: Elmer Picker M.D.   On: 08/15/2021 10:38   Recent Labs    08/16/21 0301  WBC 10.1  HGB 10.1*  HCT 32.4*  PLT 310   Recent Labs    08/16/21 0301  NA 137  K 3.8  CL 104  CO2 26  GLUCOSE 105*  BUN 21  CREATININE 0.83  CALCIUM 8.8*    Intake/Output Summary (Last 24 hours) at 08/17/2021 0824 Last data filed at 08/17/2021 0725 Gross per 24 hour  Intake 420 ml  Output 450 ml  Net -30 ml        Physical Exam: Vital Signs Blood pressure 116/89, pulse 82, temperature 98.5 F (36.9 C), resp. rate 16, height 6' 1.5" (1.867 m), weight 96.2 kg, SpO2 96  %.      General: awake, alert, appropriate, sitting up in bed eating breakfast; NAD HENT: conjugate gaze; oropharynx moist CV: regular rate; no JVD Pulmonary: no wheezing/rales/rhonchi, however decreased on L lower/mid side- and slightly less in LUL.  GI: soft, NT, ND, (+)BS Psychiatric: appropriate- pleasant; cordial  Neurological: Ox3   MS: Strength 4+/5 proximally in Ue's and LE's and 5-/5 distally.    Assessment/Plan: 1. Functional deficits which require 3+ hours per day of interdisciplinary therapy in a comprehensive inpatient rehab setting. Physiatrist is providing close team supervision and 24 hour management of active medical problems listed below. Physiatrist and rehab team continue to assess barriers to discharge/monitor patient progress toward functional and medical goals  Care Tool:  Bathing    Body parts bathed by patient: Right arm, Left arm, Abdomen, Chest, Right upper leg, Left upper leg, Face, Right lower leg, Left lower leg, Front perineal area, Buttocks     Body parts n/a: Left lower leg, Right lower leg, Buttocks, Front perineal area (peri care performed earlier)   Bathing assist Assist Level: Minimal Assistance - Patient > 75%     Upper Body Dressing/Undressing Upper body  dressing   What is the patient wearing?: Pull over shirt    Upper body assist Assist Level: Set up assist    Lower Body Dressing/Undressing Lower body dressing      What is the patient wearing?: Incontinence brief     Lower body assist Assist for lower body dressing: Moderate Assistance - Patient 50 - 74%     Toileting Toileting    Toileting assist Assist for toileting: Minimal Assistance - Patient > 75%     Transfers Chair/bed transfer  Transfers assist     Chair/bed transfer assist level: Minimal Assistance - Patient > 75%     Locomotion Ambulation   Ambulation assist   Ambulation activity did not occur: Safety/medical concerns  Assist level: Minimal  Assistance - Patient > 75% Assistive device: Walker-rolling Max distance: 15   Walk 10 feet activity   Assist  Walk 10 feet activity did not occur: Safety/medical concerns  Assist level: Minimal Assistance - Patient > 75% Assistive device: Walker-rolling   Walk 50 feet activity   Assist Walk 50 feet with 2 turns activity did not occur: Safety/medical concerns         Walk 150 feet activity   Assist Walk 150 feet activity did not occur: Safety/medical concerns         Walk 10 feet on uneven surface  activity   Assist Walk 10 feet on uneven surfaces activity did not occur: Safety/medical concerns         Wheelchair     Assist Is the patient using a wheelchair?: Yes Type of Wheelchair: Manual Wheelchair activity did not occur: Safety/medical concerns  Wheelchair assist level: Supervision/Verbal cueing Max wheelchair distance: 70    Wheelchair 50 feet with 2 turns activity    Assist    Wheelchair 50 feet with 2 turns activity did not occur: Safety/medical concerns   Assist Level: Supervision/Verbal cueing   Wheelchair 150 feet activity     Assist  Wheelchair 150 feet activity did not occur: Safety/medical concerns   Assist Level: Moderate Assistance - Patient 50 - 74%   Blood pressure 116/89, pulse 82, temperature 98.5 F (36.9 C), resp. rate 16, height 6' 1.5" (1.867 m), weight 96.2 kg, SpO2 96 %.  Medical Problem List and Plan: 1.  Debility secondary to left lower extremity cellulitis complicated by acute respiratory failure/pneumonia/multi medical             -patient may shower             -ELOS/Goals: 2-3 weeks S-MinA             -Admit to CIR  Con't PT and OT- CIR 2.  Antithrombotics: -DVT/anticoagulation:  Pharmaceutical: Xarelto.  Vascular ultrasound lower extremities negative             -antiplatelet therapy: N/A. 3. Pain: Continue Topamax 100 mg twice daily, Tramadol as needed  11/10- no pain right now- con't regimen  prn  11/11- having back pain from previous fusions in neck/back - con't tramadol prn- also will ask therapy for wedge for propping.   11/14- pain controlled- con't regimen 4. Anxiety: Continue Xanax 0.5 mg nightly as needed             -antipsychotic agents: N/A 5. Neuropsych: This patient is capable of making decisions on his own behalf. 6. Skin/Wound Care: Routine skin checks 7. Fluids/Electrolytes/Nutrition: Routine in and outs with follow-up chemistries 8.  Atrial fibrillation.  Cardiac rate controlled.  Continue Xarelto.  Tikosyn 500  mg twice daily, Toprol-XL 25 mg daily  11/11- rate controlled- con't regimen 9.  Acute respiratory failure hypoxia related to aspiration pneumonia.  Dysphagia #3 diet.  Oncology recommended high-dose steroids x2 weeks until 08/18/2021 followed by gradual taper of prednisone as per Dr. [name].  Continue BiPAP/oxygen therapy  11/10- will d/w resp therapy since cannot use his home BiPAP from lacking 1 piece to attach O2- will see if Resp therapy can help with hospital one.   11/11- has slightly more cough- encouraged pt to take tessalon pearls TID- is ordered  11/12- will check another PA/Lateral CXR and schedule tessalon pearls- and see what else might be required, based on CXR; will add 2L bleed in to BiPAP and see how that goes.   11/14- called ID just to make sure no changes to ABX- since CXR shows continued L mid/lower lobe infiltrate- will wait to hear back.  10.  Stage IV non-small cell lung cancer.  Palliative chemotherapy per oncology Dr. [name]. 11.  Hyperlipidemia.  Lipitor/fenofibrate 12.  Parkinson's disease.  Sinemet 13.  Acute on chronic anemia.  Follow-up CBC  11/14- Hb stable at 10.1- con't regimen     LOS: 5 days A FACE TO FACE EVALUATION WAS PERFORMED  Sherwood Castilla 08/17/2021, 8:24 AM

## 2021-08-17 NOTE — Progress Notes (Signed)
Distilled H2O added to patient home CPAP unit. Patient will call when ready for assistance.

## 2021-08-17 NOTE — Progress Notes (Signed)
Occupational Therapy Session Note  Patient Details  Name: Corey [name] MRN: [id number] Date of Birth: 1946/03/02  Today's Date: 08/17/2021 OT Individual Time: 1135-1203 OT Individual Time Calculation (min): 28 min    Short Term Goals: Week 1:  OT Short Term Goal 1 (Week 1): Pt will complete transfer to [facility] with LRAD MIN A OT Short Term Goal 2 (Week 1): Pt will don/doff pants min A OT Short Term Goal 3 (Week 1): Pt will perform grooming standing at sink with min A OT Short Term Goal 4 (Week 1): Pt will complete toileting tasks with LRAD and min A  Skilled Therapeutic Interventions/Progress Updates:    Pt resting in recliner upon arrival. OT intervention with focus on sit<>stand and standing balance. Sit<>stand X 3 with CGA and extended rest breaks. Pt c/o lightheadedness during 3rd sit<>stand. BP 95/67. Pt remained seated. PT reports that he fatigues late morning and afternoon with lower BP. Ongoing discharge planning and education. PT remained in recliner with seat alarm activated and all needs within reach.   Therapy Documentation Precautions:  Precautions Precautions: Fall Precaution Comments: monitor sats  and BP Restrictions Weight Bearing Restrictions: No  Pain: Pain Assessment Pain Scale: 0-10 Pain Score: 0-No pain   Therapy/Group: Individual Therapy  Leroy Libman 08/17/2021, 12:06 PM

## 2021-08-17 NOTE — Consult Note (Signed)
[name]    Date of Admission:  08/12/2021   Total days of antibiotics: 0        Reason for Consult:     Principal Problem:   Debility Active Problems:   Non-small cell carcinoma of left lung, stage 4 [facility])   Assessment: 75 year old male with stage IV non-small cell lung cancer on palliative chemo with recent hospitalization 10/25-11/9 for left lower extremity cellulitis found to have acute respiratory failure secondary to presumed aspiration pneumonia )treated with ceftriaxone metronidazole x 10days) vs ILD.  Admitted to [facility]. Infectious disease consulted as imaging showed persistent left lower lobe consolidation.  #Left lower lobe consolidation in the setting of non-small cell lung cancer/suspected ILD secondary to Keytruda/aspiration pneumonia(treated) -10/27 sputum cultures grew strep viridans and Candida albicans.  Patient has has received appropriate antibiotics for aspiration pneumonia status post speech evaluation. -Oncology was consulted during past admission and recommended high-dose steroids with a prednisone taper.  Dapsone was added for PJP prophylaxis. - Pulmonology was consulted and It was noted that CT on August 2022 left lower lung consolidation it was felt that consolidation was chronic.  Increased O2 demand was multifactorial in the setting of aspiration pneumonia versus drug-induced pneumonitis versus BOOP Recommendations:  -Hold antibiotics as pt's has been appropriately treated for PNA, Radiographic changes lag behind clinical improvement. Given pt's underlying NSLC and possible ILD it may take a while to get back to baseline.  -ID will sign off  Microbiology:  Antibiotics: Cefazolin 1025, Cefepime 10/25, 11/1 Ceftriaxone 10/26-11/7 Vancomycin 10/25-28 Azithromycin 10/27-10/31 Metronidazole 10/30-11/1,11/3-11/7 Cultures: 10/27 sputum cultures grew Candida albican and rare strep viridams  HPI: Corey [name] is a 75  y.o. male stage IV NSCLC on palliative chemo followed by Dr. [name], prostate cancer, Parkinson's disease on Sinemet, A. fib on Xarelto, CAD admitted to rehab after being hospitalized for  cellulitis and acute respiratory failure.  Patient continues to require oxygen during rehab stay with some concern for increased coughing.  Repeat chest x-ray showed continues to show left lung consolidation.  ID consulted for need for antibiotics.  Patient recently hospitalized at [facility] 10/25-11/9 after presenting for left lower extremity swelling and generalized weakness.  Chest imaging showed left lower lobe opacity.  Patient had an episode of nausea and vomiting followed by increased O2 demand on 1028. Pulmonology was consulted and noted that a CT scan in August 2022 showed left lower lobe consolidation.  Felt respiratory worsening was secondary to potential aspirational event. He was treated with ceftriaxone and metronidazole for aspiration PNA..  Oncology was consulted and recommend high dose steroids. Today, patient was sitting in his chair.  Reports his symptoms are overall improved but is concerned about consolidation seen on imaging.  Reports that his cough has improved.  He reports he had not been on oxygen prior to his hospitalization in October. Review of Systems: Review of Systems  All other systems reviewed and are negative.  Past Medical History:  Diagnosis Date   Anemia    Anxiety    Arthritis    "back, right knee, hands, ankles, neck" (05/31/2016)   Carotid artery disease ([place]) 08/30/2016   Carotid US 11/19: R 1-39, L 40-59 // Carotid US 08/2019: R 1-39; L 40-59 // Carotid US 11/21: R 1-39; L 40-59>> repeat 1 year    Chronic lower back pain    Coronary atherosclerosis of native coronary artery    a. BMS to [facility] 2004  and 2007, otherwise mild nonobstructive disease. EF normal.   Diverticulitis    Dyslipidemia    Dyspnea    Essential hypertension, benign    Family history of breast cancer     Family history of lung cancer    Family history of prostate cancer    GERD (gastroesophageal reflux disease)    Lumbar radiculopathy, chronic 02/04/2015   Right L5   Obesity    OSA on CPAP    uses cpap   Paroxysmal atrial fibrillation ([place])    a. Discovered after stroke.   Pneumonia 01/1996   PONV (postoperative nausea and vomiting)    Prostate CA (HCC)    Recurrent upper respiratory infection (URI)    Stroke ([place]) 07/2012   no deficits   Visit for monitoring Tikosyn therapy 09/18/2019    Social History   Tobacco Use   Smoking status: Former    Packs/day: 1.00    Years: 34.00    Pack years: 34.00    Types: Cigarettes    Quit date: 05/05/2003    Years since quitting: 18.2    Passive exposure: Never   Smokeless tobacco: Never  Vaping Use   Vaping Use: Never used  Substance Use Topics   Alcohol use: No    Alcohol/week: 0.0 standard drinks   Drug use: No    Family History  Problem Relation Age of Onset   Hypertension Mother    Heart disease Father    Heart attack Father    Prostate cancer Brother 85   Parkinson's disease Brother    Lung cancer Paternal Aunt        hx of smoking   Breast cancer Paternal Grandmother        dx 8s, bilateral mastectomies   Heart attack Paternal Grandfather    Blindness Son    Colon cancer Neg Hx    Pancreatic cancer Neg Hx    Scheduled Meds:  acidophilus  1 capsule Oral Daily   arformoterol  15 mcg Nebulization BID   atorvastatin  40 mg Oral Daily   benzonatate  200 mg Oral TID   budesonide (PULMICORT) nebulizer solution  0.5 mg Nebulization BID   carbidopa-levodopa  1 tablet Oral 3 times per day   Chlorhexidine Gluconate Cloth  6 each Topical Daily   dapsone  100 mg Oral Daily   dofetilide  500 mcg Oral BID   fenofibrate  160 mg Oral Daily   fluticasone  2 spray Each Nare Daily   folic acid  1 mg Oral Daily   guaiFENesin  1,200 mg Oral BID   ipratropium  1-2 spray Each Nare Q12H   ipratropium  0.5 mg Nebulization BID    magnesium oxide  400 mg Oral Daily   metoprolol succinate  25 mg Oral Daily   multivitamin with minerals  1 tablet Oral Daily   pantoprazole  40 mg Oral Daily   potassium chloride  10 mEq Oral Daily   rivaroxaban  20 mg Oral Q supper   sodium chloride flush  10-40 mL Intracatheter Q12H   tamsulosin  0.4 mg Oral QPC supper   topiramate  100 mg Oral BID   traZODone  50 mg Oral QHS   Continuous Infusions: PRN Meds:.acetaminophen **OR** acetaminophen, albuterol, ALPRAZolam, chlorpheniramine-HYDROcodone, lip balm, loratadine, ondansetron **OR** ondansetron (ZOFRAN) IV, polyethylene glycol, sodium chloride flush, traMADol Allergies  Allergen Reactions   Amoxicillin Swelling    Tolerates Rocephin   Fosinopril Other (See Comments)   Hydromorphone Nausea Only, Other (  See Comments) and Nausea And Vomiting   Ampicillin Rash   Monopril [Fosinopril Sodium] Other (See Comments)    Muscle aches & pain   Other Other (See Comments)   Rosuvastatin Other (See Comments)    Joint pain    OBJECTIVE: Blood pressure 116/89, pulse 82, temperature 98.5 F (36.9 C), resp. rate 16, height 6' 1.5" (1.867 m), weight 96.2 kg, SpO2 96 %.  Physical Exam Constitutional:      General: He is not in acute distress.    Appearance: He is normal weight. He is not toxic-appearing.  HENT:     Head: Normocephalic and atraumatic.     Right Ear: External ear normal.     Left Ear: External ear normal.     Nose: No congestion or rhinorrhea.     Mouth/Throat:     Mouth: Mucous membranes are moist.     Pharynx: Oropharynx is clear.  Eyes:     Extraocular Movements: Extraocular movements intact.     Conjunctiva/sclera: Conjunctivae normal.     Pupils: Pupils are equal, round, and reactive to light.  Cardiovascular:     Rate and Rhythm: Normal rate and regular rhythm.     Heart sounds: No murmur heard.   No friction rub. No gallop.  Pulmonary:     Effort: Pulmonary effort is normal.     Breath sounds: Normal  breath sounds.  Abdominal:     General: Abdomen is flat. Bowel sounds are normal.     Palpations: Abdomen is soft.  Musculoskeletal:        General: No swelling. Normal range of motion.     Cervical back: Normal range of motion and neck supple.  Skin:    General: Skin is warm and dry.  Neurological:     General: No focal deficit present.     Mental Status: He is oriented to person, place, and time.  Psychiatric:        Mood and Affect: Mood normal.    Lab Results Lab Results  Component Value Date   WBC 10.1 08/16/2021   HGB 10.1 (L) 08/16/2021   HCT 32.4 (L) 08/16/2021   MCV 101.6 (H) 08/16/2021   PLT 310 08/16/2021    Lab Results  Component Value Date   CREATININE 0.83 08/16/2021   BUN 21 08/16/2021   NA 137 08/16/2021   K 3.8 08/16/2021   CL 104 08/16/2021   CO2 26 08/16/2021    Lab Results  Component Value Date   ALT 20 08/13/2021   AST 25 08/13/2021   ALKPHOS 64 08/13/2021   BILITOT 0.8 08/13/2021       Laurice Record, [facility] 08/17/2021, 8:56 AM

## 2021-08-17 NOTE — Plan of Care (Signed)
  Problem: Consults Goal: RH GENERAL PATIENT EDUCATION Description: See Patient Education module for education specifics. Outcome: Progressing   Problem: RH BLADDER ELIMINATION Goal: RH STG MANAGE BLADDER WITH ASSISTANCE Description: STG Manage Bladder With Min Assistance Outcome: Progressing Goal: RH STG MANAGE BLADDER WITH MEDICATION WITH ASSISTANCE Description: STG Manage Bladder With Medication With [place]. Outcome: Progressing   Problem: RH SKIN INTEGRITY Goal: RH STG MAINTAIN SKIN INTEGRITY WITH ASSISTANCE Description: STG Maintain Skin Integrity With [place]. Outcome: Progressing   Problem: RH SAFETY Goal: RH STG ADHERE TO SAFETY PRECAUTIONS W/ASSISTANCE/DEVICE Description: STG Adhere to Safety Precautions With Cues and Reminders. Outcome: Progressing   Problem: RH KNOWLEDGE DEFICIT GENERAL Goal: RH STG INCREASE KNOWLEDGE OF SELF CARE AFTER HOSPITALIZATION Description: Patient will demonstrate knowledge of medication management, bladder management with educational materials and handouts provided by staff independently at discharge. Outcome: Progressing

## 2021-08-17 NOTE — Progress Notes (Signed)
Placed patient on home CPAP unit, home settings with 3L O2 bleed in.

## 2021-08-17 NOTE — Plan of Care (Signed)
  Problem: RH Swallowing Goal: LTG Patient will consume least restrictive diet using compensatory strategies with assistance (SLP) Description: LTG:  Patient will consume least restrictive diet using compensatory strategies with assistance (SLP) Outcome: Completed/Met Goal: LTG Patient will participate in dysphagia therapy to increase swallow function with assistance (SLP) Description: LTG:  Patient will participate in dysphagia therapy to increase swallow function with assistance (SLP) Outcome: Completed/Met   

## 2021-08-17 NOTE — Progress Notes (Signed)
Occupational Therapy Session Note  Patient Details  Name: Corey [name] MRN: [id number] Date of Birth: 03/15/1946  Today's Date: 08/17/2021 OT Individual Time: 1015-1105 and 1415-1531 OT Individual Time Calculation (min): 50 min and 76 min   Short Term Goals: Week 1:  OT Short Term Goal 1 (Week 1): Pt will complete transfer to [facility] with LRAD MIN A OT Short Term Goal 2 (Week 1): Pt will don/doff pants min A OT Short Term Goal 3 (Week 1): Pt will perform grooming standing at sink with min A OT Short Term Goal 4 (Week 1): Pt will complete toileting tasks with LRAD and min A  Skilled Therapeutic Interventions/Progress Updates:    Session 1: Pt greeted at time of session semireclined in bed resting eager to participate in OT and needing to use the bathroom. Supine > sit Supervision and switched to portable O2 tank. Donned TEDS prior to mobility with assist for time, no dizziness and pt wanting to walk to bathroom after saying he did this with nursing last night. Ambulated bed > bathroom with CGA and RW, extended time and therapist managing O2. Clothing management Min A to fully get down and 2/2 fatigue. Note O2 sats 95% or higher at rest but dropped to low 80s s/p mobility, rebounding to 90% + with pursed lip breathing and deep breaths. Pt remained on 3L throughout. On commode, noted to c/o 4/10 dizziness and BP checked at 114/73. Dizziness improved with rest break. Able to perform hygiene with anterior weight shift and therapist assist applying barrier cream. Donned brief/shorts with Mod A, stand pivot toilet > w/c CGA and set up at sink for oral hygiene. Set up in recliner alarm on call bell in reach.   Session 2: Pt greeted at time of session sitting up in recliner agreeable to OT session, no pain reported. Initial discussion based around DC planning, DME needs, assistance at home, etc. Pt states he has a garden tub w/ shower seat and detachable handle, does not want to use wife's standard tub at  home. After discussion, stand pivot recliner > w/c with Min/CGA and RW, transported room <> gym total A for time. Set up on SCIFIT and performed 6 mins total starting on level 2 > increased to level 3.5 > increased to level 5 to tolerance for UB strength and global conditioning. 2 rounds of dynamic standing at [facility] for :46 and :49 seconds for hitting various targets and crossing midline to improve standing balance and tolerance. 1x10-15 of the following at rebounder seated: throws and throws + chest press with cues for sequencing. At end of session, needing to toilet. Stand pivot w/c <> toilet with Min A 2/2 fatigue and CGA for clothing management to doff. Min A overall for toileting. Stand pivot toilet > w/c > recliner Min A overall. Alarm on call bell in reach.   Therapy Documentation Precautions:  Precautions Precautions: Fall Precaution Comments: monitor sats  and BP Restrictions Weight Bearing Restrictions: No    Therapy/Group: Individual Therapy  Viona Gilmore 08/17/2021, 7:24 AM

## 2021-08-17 NOTE — Progress Notes (Signed)
Speech Language Pathology Discharge Summary  Patient Details  Name: Corey [name] MRN: [id number] Date of Birth: 09/22/1946  Today's Date: 08/17/2021 SLP Individual Time: 0830-0910 SLP Individual Time Calculation (min): 40 min   Skilled Therapeutic Interventions:  Pt seen for skilled ST with focus on swallowing goals. Pt has been tolerating a regular/thin diet with use of proper positioning, slow rate and small bites with no overt s/s aspiration during CIR stay. Pt able to list 4/4 swallow strategies he has been implementing with meals and snacks. Pt reports preferring to be up in chair for all meals but hardware in neck and back make prolonged sitting a challenge at this time. Pt raising HOB to 80 degrees for regular/thin snack this date. Pt independent for small bites, occasional verbal cues to increase rate of liquid wash. Pt with no overt s/s aspiration across any trials this date. Recommend discharge from skilled ST services at this time due to all goals met.  Patient has met 2 of 2 long term goals.  Patient to discharge at overall Modified Independent level.   Clinical Impression/Discharge Summary:   Pt has made excellent gains during CIR and has met 2 of 2 long term swallowing goals. Pt is tolerating a regular/thin diet with Modified Independent use of compensatory strategies as trained. Pt educated on continued use of strategies including alternating solids and liquids at 2:1 ratio, pt demonstrating carryover and teach back. Recommend discharge from skilled ST on regular/thin with standard swallow precautions/strategies.   Care Partner:  Caregiver Able to Provide Assistance: Yes  Type of Caregiver Assistance: Cognitive  Recommendation:  None   Reasons for discharge: Treatment goals met   Patient/Family Agrees with Progress Made and Goals Achieved: Yes    Dewaine Conger 08/17/2021, 9:11 AM

## 2021-08-18 ENCOUNTER — Telehealth: Payer: Self-pay | Admitting: Medical Oncology

## 2021-08-18 MED ORDER — DOFETILIDE 500 MCG PO CAPS
500.0000 ug | ORAL_CAPSULE | Freq: Two times a day (BID) | ORAL | Status: DC
Start: 2021-08-18 — End: 2021-08-29
  Administered 2021-08-18 – 2021-08-29 (×23): 500 ug via ORAL
  Filled 2021-08-18 (×23): qty 1

## 2021-08-18 MED ORDER — PREDNISONE 20 MG PO TABS
80.0000 mg | ORAL_TABLET | Freq: Every day | ORAL | Status: DC
  Administered 2021-08-18 – 2021-08-24 (×7): 80 mg via ORAL
  Filled 2021-08-18 (×9): qty 4

## 2021-08-18 NOTE — Progress Notes (Addendum)
PROGRESS NOTE   Subjective/Complaints:  Pt reports slept a little better, but still not great.  Finished 100 mg prednisone yesterday- explained could feel worse/cough more for a few days due to high dose of prednisone- Per ID, stop ABX.   ROS:  Pt denies SOB, abd pain, CP, N/V/C/D, and vision changes; (+) cough   Objective:   No results found. Recent Labs    08/16/21 0301  WBC 10.1  HGB 10.1*  HCT 32.4*  PLT 310   Recent Labs    08/16/21 0301  NA 137  K 3.8  CL 104  CO2 26  GLUCOSE 105*  BUN 21  CREATININE 0.83  CALCIUM 8.8*    Intake/Output Summary (Last 24 hours) at 08/18/2021 0828 Last data filed at 08/18/2021 0735 Gross per 24 hour  Intake 720 ml  Output 1000 ml  Net -280 ml        Physical Exam: Vital Signs Blood pressure 112/72, pulse 85, temperature 98 F (36.7 C), temperature source Oral, resp. rate 18, height 6' 1.5" (1.867 m), weight 98.8 kg, SpO2 98 %.       General: awake, alert, appropriate, sitting up in bed; eating; on O2 3L by [place]; NAD HENT: conjugate gaze; oropharynx moist CV: regular rate; no JVD Pulmonary: decreased on L lung, but no w/r/r- adequate air movement GI: soft, NT, ND, (+)BS Psychiatric: appropriate- slightly irritable Neurological: Ox3 MS: Strength 4+/5 proximally in Ue's and LE's and 5-/5 distally.    Assessment/Plan: 1. Functional deficits which require 3+ hours per day of interdisciplinary therapy in a comprehensive inpatient rehab setting. Physiatrist is providing close team supervision and 24 hour management of active medical problems listed below. Physiatrist and rehab team continue to assess barriers to discharge/monitor patient progress toward functional and medical goals  Care Tool:  Bathing    Body parts bathed by patient: Right arm, Left arm, Abdomen, Chest, Right upper leg, Left upper leg, Face, Right lower leg, Left lower leg, Front perineal  area, Buttocks     Body parts n/a: Left lower leg, Right lower leg, Buttocks, Front perineal area (peri care performed earlier)   Bathing assist Assist Level: Minimal Assistance - Patient > 75%     Upper Body Dressing/Undressing Upper body dressing   What is the patient wearing?: Pull over shirt    Upper body assist Assist Level: Set up assist    Lower Body Dressing/Undressing Lower body dressing      What is the patient wearing?: Incontinence brief     Lower body assist Assist for lower body dressing: Moderate Assistance - Patient 50 - 74%     Toileting Toileting    Toileting assist Assist for toileting: Minimal Assistance - Patient > 75%     Transfers Chair/bed transfer  Transfers assist     Chair/bed transfer assist level: Minimal Assistance - Patient > 75%     Locomotion Ambulation   Ambulation assist   Ambulation activity did not occur: Safety/medical concerns  Assist level: Minimal Assistance - Patient > 75% Assistive device: Walker-rolling Max distance: 15   Walk 10 feet activity   Assist  Walk 10 feet activity did not occur: Safety/medical concerns  Assist level: Minimal Assistance - Patient > 75% Assistive device: Walker-rolling   Walk 50 feet activity   Assist Walk 50 feet with 2 turns activity did not occur: Safety/medical concerns         Walk 150 feet activity   Assist Walk 150 feet activity did not occur: Safety/medical concerns         Walk 10 feet on uneven surface  activity   Assist Walk 10 feet on uneven surfaces activity did not occur: Safety/medical concerns         Wheelchair     Assist Is the patient using a wheelchair?: Yes Type of Wheelchair: Manual Wheelchair activity did not occur: Safety/medical concerns  Wheelchair assist level: Supervision/Verbal cueing Max wheelchair distance: 70    Wheelchair 50 feet with 2 turns activity    Assist    Wheelchair 50 feet with 2 turns activity did  not occur: Safety/medical concerns   Assist Level: Supervision/Verbal cueing   Wheelchair 150 feet activity     Assist  Wheelchair 150 feet activity did not occur: Safety/medical concerns   Assist Level: Moderate Assistance - Patient 50 - 74%   Blood pressure 112/72, pulse 85, temperature 98 F (36.7 C), temperature source Oral, resp. rate 18, height 6' 1.5" (1.867 m), weight 98.8 kg, SpO2 98 %.  Medical Problem List and Plan: 1.  Debility secondary to left lower extremity cellulitis complicated by acute respiratory failure/pneumonia/multi medical             -patient may shower             -ELOS/Goals: 2-3 weeks S-MinA             -Admit to CIR  Con't PT/OT/CIR_ team conference to set d/c date and determine how he's doing.  2.  Antithrombotics: -DVT/anticoagulation:  Pharmaceutical: Xarelto.  Vascular ultrasound lower extremities negative             -antiplatelet therapy: N/A. 3. Pain: Continue Topamax 100 mg twice daily, Tramadol as needed  11/10- no pain right now- con't regimen prn  11/11- having back pain from previous fusions in neck/back - con't tramadol prn- also will ask therapy for wedge for propping.   11/14- pain controlled- con't regimen 4. Anxiety: Continue Xanax 0.5 mg nightly as needed             -antipsychotic agents: N/A 5. Neuropsych: This patient is capable of making decisions on his own behalf. 6. Skin/Wound Care: Routine skin checks 7. Fluids/Electrolytes/Nutrition: Routine in and outs with follow-up chemistries 8.  Atrial fibrillation.  Cardiac rate controlled.  Continue Xarelto.  Tikosyn 500 mg twice daily, Toprol-XL 25 mg daily  11/11- rate controlled- con't regimen 9.  Acute respiratory failure hypoxia related to aspiration pneumonia.  Dysphagia #3 diet.  Oncology recommended high-dose steroids x2 weeks until 08/18/2021 followed by gradual taper of prednisone as per Dr. [name].  Continue BiPAP/oxygen therapy  11/10- will d/w resp therapy since  cannot use his home BiPAP from lacking 1 piece to attach O2- will see if Resp therapy can help with hospital one.   11/11- has slightly more cough- encouraged pt to take tessalon pearls TID- is ordered  11/12- will check another PA/Lateral CXR and schedule tessalon pearls- and see what else might be required, based on CXR; will add 2L bleed in to BiPAP and see how that goes.   11/14- called ID just to make sure no changes to ABX- since CXR shows continued L mid/lower lobe  infiltrate- will wait to hear back.   11/15- Off ABX- - stopped it; also have reached out to Dr [name] about Prednisone dosing- will taper to 80 mg daily for 1 week, then 60 mg x 1 week, and so on; to off.  10.  Stage IV non-small cell lung cancer.  Palliative chemotherapy per oncology Dr. [name].  11/15- have placed a call to Oncology- waiting to hear back.  11.  Hyperlipidemia.  Lipitor/fenofibrate 12.  Parkinson's disease.  Sinemet 13.  Acute on chronic anemia.  Follow-up CBC  11/14- Hb stable at 10.1- con't regimen   I spent a total of 38 minutes on total care- >50% coordination of care- doing team conference and speaking with Dr [name] about his steroids.   LOS: 6 days A FACE TO FACE EVALUATION WAS PERFORMED  Maekayla Giorgio 08/18/2021, 8:28 AM

## 2021-08-18 NOTE — Patient Care Conference (Signed)
Inpatient RehabilitationTeam Conference and Plan of Care Update Date: 08/18/2021   Time: 11:32 AM    Patient Name: Corey [name]      Medical Record Number: [id number]  Date of Birth: 01-Apr-1946 Sex: Male         Room/Bed: 4M01C/4M01C-01 Payor Info: Payor: [employer] MEDICARE / Plan: [employer] MEDICARE CHOICE PPO / Product Type: *No Product type* /    Admit Date/Time:  08/12/2021  4:35 PM  Primary Diagnosis:  [facility] Problems: Principal Problem:   Debility Active Problems:   Non-small cell carcinoma of left lung, stage 4 [facility])    Expected Discharge Date: Expected Discharge Date: 08/26/21  Team Members Present: Physician leading conference: Dr. Courtney Heys Social Worker Present: Ovidio Kin, LCSW Nurse Present: Dorthula Nettles, RN PT Present: Ailene Rud, PT OT Present: Lillia Corporal, OT PPS Coordinator present : Ileana Ladd, PT     Current Status/Progress Goal Weekly Team Focus  Bowel/Bladder   continent B/B  remain continent  assess toileting needs as needed   Swallow/Nutrition/ Hydration   Regular textures with thin liquids, Mod I  Mod I  Goals Met, D/C from SLP caseload on 11/14   ADL's   Min A ambulatory transfers using RW, setup UB dressing, Min A LB dressing, Min A toileting tasks, limited by cardiopulmonary endurance and orthostatics  Supervision overall  Cardiopulmonary endurance, functional transfers, balance, adaptive self care skills, d/c planning   Mobility   CGA-min A overall, gait up to 15 ft with RW, limited upright tolerance  supervision  OOB tolerance, endurance   Communication             Safety/Cognition/ Behavioral Observations            Pain   3/10 headache  < 2  assess pain q 4 hr and prn   Skin   CDI, port to right chest  no new skin breakdown  assess skin q shift and prn     Discharge Planning:  Home with wife who works but her schedule is flexible, has other family members to assist also   Team Discussion: Abx completed.  Prednisone decreased. Pain controlled, Hgb stable. Issues with BiPAP worked out. Continent B/B. 3/10 headache. Home with spouse who works. Will not have 24 hr care. Min assist but fatigues quickly. Breathing does sound labored. Does have transport chair. Patient on target to meet rehab goals: yes, supervision goals and can meet these goals if endurance increases.  *See Care Plan and progress notes for long and short-term goals.   Revisions to Treatment Plan:  None  Teaching Needs: Family education, medication/pain management, skin/wound care, Oxygen therapy education, safety awareness, transfer training, etc.  Current Barriers to Discharge: Decreased caregiver support, Home enviroment access/layout, Weight, Medication compliance, and New oxygen  Possible Resolutions to Barriers: Family education Order DME Set-up Oxygen for home     Medical Summary Current Status: sleeping slightly better; continent B/B; HA 3/10 and back pain sometimes; R chest port;  Barriers to Discharge: Behavior;Home enviroment access/layout;Medical stability;Other (comments);New oxygen  Barriers to Discharge Comments: wife and family to care for him today- doesn't have someone all the time- fatigues extremely quickly; styage IV cancer- palliative treatment Possible Resolutions to [employer] Focus: needs O2- sats drop off O2; goals- supervison- on target if endurance rises-unrealisitc expectations as to what level he was; d/c 11/23- needs to get home   Continued Need for Acute Rehabilitation Level of Care: The patient requires daily medical management by a physician with  specialized training in physical medicine and rehabilitation for the following reasons: Direction of a multidisciplinary physical rehabilitation program to maximize functional independence : Yes Medical management of patient stability for increased activity during participation in an intensive rehabilitation regime.: Yes Analysis of laboratory  values and/or radiology reports with any subsequent need for medication adjustment and/or medical intervention. : Yes   I attest that I was present, lead the team conference, and concur with the assessment and plan of the team.   Cristi Loron 08/18/2021, 4:05 PM

## 2021-08-18 NOTE — Progress Notes (Signed)
Physical Therapy Session Note  Patient Details  Name: Corey [name] MRN: [id number] Date of Birth: 1946/07/26  Today's Date: 08/18/2021 PT Individual Time: 0800-0915, 9480-1655 PT Individual Time Calculation (min): 75 min, 24 min  Short Term Goals: Week 1:  PT Short Term Goal 1 (Week 1): Pt will ambulate x 50 ft with LRAD PT Short Term Goal 2 (Week 1): Pt will initiate stair training PT Short Term Goal 3 (Week 1): Pt wil tolerate x 30 min of activity with rest breaks  Skilled Therapeutic Interventions/Progress Updates:    Session 1:  pt received in bed and agreeable to therapy. Pt reported lightheadedness with HOB elevated. Upon inspection, Pt's nasal cannula was not connect, O2 was off and wall air was on, connected to nebulizer. Nursing and respiratory made aware. SPO2= 99 on 2L shortly after turning on O2. Pt continued to be symptomatic, so provided 3L during mobility, pt self initiated using upright posturing for improved breathing mechanics. Tot A for ted hose and shoes for time. Pt requested to use the bathroom, squat pivot EOB>w/c<>commode with CGA. CGA-supervision for 3/3 toileting tasks. Hand hygiene at w/c level. Pt ambulated 2 x 25 ft, c/o of light headedness on 3L O2 which improved with rest. O2=88, rebounded to mid 90s in 3-4 minutes. Pt propelled w/c with BUE x 80 ft, continued c/o lightheadedness, O2 maintained above 93%. Pt performed sci fit ~7 min with 4  rest breaks, continued O2 monitoring revealed that sats begin to drop after 1-2 min of light activity. Pt returned to room and performed Stand pivot transfer with RW and CGA to recliner, was left with all needs in reach and alarm active.   Session 2: Pt seated in recliner on arrival and politely declined therapy d/t fatigue, but did request to use the bathroom while therapist was there. ambulatory transfer to commode with min A and RW. Supervision for hygiene, needed assist to pull pants up from floor before completing clothing  management. Walked to sink in same manner and sat in w/c for hand hygiene. Pt returned to recliner and was left with all needs in reach and alarm active. Pt maintained on 3L O2 throughout, no c/o of lightheaded noted.   Therapy Documentation Precautions:  Precautions Precautions: Fall Precaution Comments: monitor sats  and BP Restrictions Weight Bearing Restrictions: No General:   Vital Signs:   Pain:   Mobility:   Locomotion :    Trunk/Postural Assessment :    Balance:   Exercises:   Other Treatments:      Therapy/Group: Individual Therapy  [name] 08/18/2021, 8:01 AM

## 2021-08-18 NOTE — Progress Notes (Signed)
Occupational Therapy Session Note  Patient Details  Name: Corey [name] MRN: [id number] Date of Birth: 03/05/1946  Today's Date: 08/18/2021 OT Individual Time: 1015-1100 OT Individual Time Calculation (min): 45 min    Short Term Goals: Week 1:  OT Short Term Goal 1 (Week 1): Pt will complete transfer to [facility] with LRAD MIN A OT Short Term Goal 2 (Week 1): Pt will don/doff pants min A OT Short Term Goal 3 (Week 1): Pt will perform grooming standing at sink with min A OT Short Term Goal 4 (Week 1): Pt will complete toileting tasks with LRAD and min A  Skilled Therapeutic Interventions/Progress Updates:  Pt greeted  seated in recliner   agreeable to OT intervention. Session focus on BADL reducation. Pt on 2L [place] at start of session with SpO2 reading 87% pt unable to increase to 90% even with 2 mins of PLB. Increased O2 to 3L for mobility with SpO2 holding at 92%. Pt completed stand pivot transfer from recliner>w/c with rW and MIN A. Pt with one episode of dizziness but passed with seated rest break. Pt transported to sink with total A for bathing. Pt completed bathing with MIN A needing assist to wash buttock in standing and CGA for dynamic  balance. Pt required seated rest break in between all standing tasks at sink d/t impaired activity tolerance.  CGA for sit<>stands at sink during dressing tasks with cues for hand placement when using sink to sit>stand. Pt reports need to void bladder s/u assist to use urinal from w/c. Pt donned brief and pants with reacher with MIN A to assit with tightening brief in standing. S/u assist for UB dressing.    Pt O2 87% after dressing pt needed 1 min 30 secs to recover to 91%. Pt completed ~ 3 ft of functional ambulation from w/c >recliner with rw and CGA.      pt left seated in recliner with alarm activated and all needs within reach.                      Therapy Documentation Precautions:  Precautions Precautions: Fall Precaution Comments: monitor sats  and  BP Restrictions Weight Bearing Restrictions: No    Pain: no pain reported during session     Therapy/Group: Individual Therapy  Precious Haws 08/18/2021, 12:16 PM

## 2021-08-18 NOTE — Telephone Encounter (Signed)
Concerned re " pneumonia"  and ID recommends to d/c his antibiotics/steroids.Will Dr. [name] advise.  Called back and no answer.

## 2021-08-18 NOTE — Progress Notes (Signed)
Occupational Therapy Session Note  Patient Details  Name: Corey [name] MRN: [id number] Date of Birth: Oct 23, 1945  Today's Date: 08/18/2021 OT Individual Time: 1300-1345 OT Individual Time Calculation (min): 45 min  and Today's Date: 08/18/2021 OT Missed Time: 15 Minutes Missed Time Reason: Patient fatigue   Short Term Goals: Week 1:  OT Short Term Goal 1 (Week 1): Pt will complete transfer to [facility] with LRAD MIN A OT Short Term Goal 2 (Week 1): Pt will don/doff pants min A OT Short Term Goal 3 (Week 1): Pt will perform grooming standing at sink with min A OT Short Term Goal 4 (Week 1): Pt will complete toileting tasks with LRAD and min A   Skilled Therapeutic Interventions/Progress Updates:    Pt greeted at time of session sitting up in recliner agreeable to OT session, no pain at rest and finishing up vitals with NT. Note pt hyperverbal at beginning of session and providing extensive reasons why he is unsure/hesitant regarding his DC date and wanting to line up with chemo. MD and SW both aware and working with the pt. MD present at beginning of session to speak with the pt regarding DC date. Discussed DC planning with the pt regarding DME needs as he already has shower seat and does want BSC. Stand pivot recliner <> wheelchair with Min A overall and using RW. Transported to ADL apartment and educated on technique for step over shower method as this is what pt wants to do at home and educated on using TTB as safer option. Pt open to education but still wants to use shower seat. Transferred to main gym and simulated step over tub method with having pt step over cone approx 5-6 inches high, unable to fully clear. Pt stating this is because it is afternoon and he is fatigued, requesting to go back to room. Stand pivot w/c > recliner same as above. Alarm on call bell in reach. Note on 3L of O2 throughout session. Missed 15 mins 2/2 fatigue.   Therapy Documentation Precautions:   Precautions Precautions: Fall Precaution Comments: monitor sats  and BP Restrictions Weight Bearing Restrictions: No     Therapy/Group: Individual Therapy  Viona Gilmore 08/18/2021, 7:23 AM

## 2021-08-18 NOTE — Progress Notes (Signed)
Patient ID: Corey [name], male   DOB: Dec 11, 1945, 75 y.o.   MRN: [id number]  Met with pt to update regarding team conference goals of supervision level and target discharge date of 11/23. He appreciates the team wanting him home for Thanksgiving, but feels this is too soon and he may not be ready to go home by then. He wants the chemo discussed first since was told would start 11/22. He does not have 24/7 care and really wants to be stronger before going home. Discussed this is two week time frame told would be here. He plans to talk with MD and team regarding his concerns. Continue to work on discharge plans.

## 2021-08-19 ENCOUNTER — Ambulatory Visit (HOSPITAL_COMMUNITY)
Admission: RE | Admit: 2021-08-19 | Discharge: 2021-08-19 | Disposition: A | Discharge status: Home / Self Care | Payer: Medicare PPO | Source: Ambulatory Visit | Attending: Cardiovascular Disease | Admitting: Cardiovascular Disease

## 2021-08-19 ENCOUNTER — Ambulatory Visit: Payer: Medicare PPO | Admitting: Internal Medicine

## 2021-08-19 NOTE — Progress Notes (Signed)
Physical Therapy Session Note  Patient Details  Name: Corey [name] MRN: [id number] Date of Birth: [birth year]  Today's Date: 08/19/2021 PT Individual Time: 1100-1156 PT Individual Time Calculation (min): 56 min   Short Term Goals: Week 1:  PT Short Term Goal 1 (Week 1): Pt will ambulate x 50 ft with LRAD PT Short Term Goal 2 (Week 1): Pt will initiate stair training PT Short Term Goal 3 (Week 1): Pt wil tolerate x 30 min of activity with rest breaks  Skilled Therapeutic Interventions/Progress Updates:    Pt received in recliner and agreeable to therapy.  No complaint of pain. Session focused on building endurance and LE strength for improved independence at home. Pt maintained on 3L O2 throughout session, O2 dipped as low as 89 at times with activity, but rebounded > 90 w/I 60 sec and >95 w/I 2 min.   Sit to stand with CGA to RW throughout session, supervision at times.  Gait x 30 ft, x 45 ft, x 50 ft with RW and close supervision. VC for incr step length and heel strike with mod improvement in final bout. Required extended rest break to recover, with mild dizziness noted at times.   Pt then transitioned to navigating 6" stairs, 2 x 4 with extended rest break. Pt required heavy CGA d/t new task and becoming SOB during first bout. No noted SOB or dizziness after second bout with improved pacing. Discussed pt's home set up and using landing to rest when taking stairs to reach bed room. Also discussed pt's goals and d/c plan.  Pt propelled w/c with BLE for posterior chain strengthening. Stand pivot transfer back to recliner at end of session, pt was left with all needs in reach and alarm active.   Therapy Documentation Precautions:  Precautions Precautions: Fall Precaution Comments: monitor sats  and BP Restrictions Weight Bearing Restrictions: No     Therapy/Group: Individual Therapy  [name] 08/19/2021, 12:09 PM

## 2021-08-19 NOTE — Progress Notes (Signed)
PROGRESS NOTE   Subjective/Complaints:  Pt reports slept better- "at least 2 segments of sleep"- deeper sleep.  Back hurting more-  LBM x2 yesterday-   On 4L O2- needing nebs and O2 when goes home- will d/w team to make sure we arrange for this at home.    ROS:  Pt denies SOB, abd pain, CP, N/V/C/D, and vision changes   Objective:   No results found. No results for input(s): WBC, HGB, HCT, PLT in the last 72 hours.  No results for input(s): NA, K, CL, CO2, GLUCOSE, BUN, CREATININE, CALCIUM in the last 72 hours.   Intake/Output Summary (Last 24 hours) at 08/19/2021 0855 Last data filed at 08/19/2021 0730 Gross per 24 hour  Intake 720 ml  Output 500 ml  Net 220 ml        Physical Exam: Vital Signs Blood pressure 114/71, pulse 92, temperature 98.2 F (36.8 C), resp. rate 18, height 6' 1.5" (1.867 m), weight 98.2 kg, SpO2 92 %.       General: awake, alert, appropriate, on 4L O2 by [place]; sitting up in bedside chair; NAD HENT: conjugate gaze; oropharynx moist CV: regular rate; no JVD Pulmonary: decreased at bases, but sounds good; good air movement B/L and CTA B/L  GI: soft, NT, ND, (+)BS- normoactive Psychiatric: appropriate- a little less irritable Neurological: Ox3 MS: Strength 4+/5 proximally in Ue's and LE's and 5-/5 distally.    Assessment/Plan: 1. Functional deficits which require 3+ hours per day of interdisciplinary therapy in a comprehensive inpatient rehab setting. Physiatrist is providing close team supervision and 24 hour management of active medical problems listed below. Physiatrist and rehab team continue to assess barriers to discharge/monitor patient progress toward functional and medical goals  Care Tool:  Bathing    Body parts bathed by patient: Right arm, Left arm, Chest, Abdomen, Front perineal area, Face   Body parts bathed by helper: Buttocks Body parts n/a: Left lower leg, Right  lower leg, Buttocks, Front perineal area (peri care performed earlier)   Bathing assist Assist Level: Minimal Assistance - Patient > 75%     Upper Body Dressing/Undressing Upper body dressing   What is the patient wearing?: Pull over shirt    Upper body assist Assist Level: Set up assist    Lower Body Dressing/Undressing Lower body dressing      What is the patient wearing?: Incontinence brief, Pants     Lower body assist Assist for lower body dressing: Minimal Assistance - Patient > 75% (with reacher)     Toileting Toileting    Toileting assist Assist for toileting: Set up assist (urinal)     Transfers Chair/bed transfer  Transfers assist     Chair/bed transfer assist level: Minimal Assistance - Patient > 75%     Locomotion Ambulation   Ambulation assist   Ambulation activity did not occur: Safety/medical concerns  Assist level: Minimal Assistance - Patient > 75% Assistive device: Walker-rolling Max distance: 15   Walk 10 feet activity   Assist  Walk 10 feet activity did not occur: Safety/medical concerns  Assist level: Minimal Assistance - Patient > 75% Assistive device: Walker-rolling   Walk 50 feet activity  Assist Walk 50 feet with 2 turns activity did not occur: Safety/medical concerns         Walk 150 feet activity   Assist Walk 150 feet activity did not occur: Safety/medical concerns         Walk 10 feet on uneven surface  activity   Assist Walk 10 feet on uneven surfaces activity did not occur: Safety/medical concerns         Wheelchair     Assist Is the patient using a wheelchair?: Yes Type of Wheelchair: Manual Wheelchair activity did not occur: Safety/medical concerns  Wheelchair assist level: Supervision/Verbal cueing Max wheelchair distance: 70    Wheelchair 50 feet with 2 turns activity    Assist    Wheelchair 50 feet with 2 turns activity did not occur: Safety/medical concerns   Assist Level:  Supervision/Verbal cueing   Wheelchair 150 feet activity     Assist  Wheelchair 150 feet activity did not occur: Safety/medical concerns   Assist Level: Moderate Assistance - Patient 50 - 74%   Blood pressure 114/71, pulse 92, temperature 98.2 F (36.8 C), resp. rate 18, height 6' 1.5" (1.867 m), weight 98.2 kg, SpO2 92 %.  Medical Problem List and Plan: 1.  Debility secondary to left lower extremity cellulitis complicated by acute respiratory failure/pneumonia/multi medical             -patient may shower             -ELOS/Goals: 2-3 weeks S-MinA             -Admit to CIR  Cd/c date 11/23- will NOT get Chemo on 11/22-not until Prednisone down to 10 mg or less per Dr [name]-   Con't PT and OT- will need to get O2 which is new at home and nebs machine- let PA know to arrange 2.  Antithrombotics: -DVT/anticoagulation:  Pharmaceutical: Xarelto.  Vascular ultrasound lower extremities negative             -antiplatelet therapy: N/A. 3. Pain: Continue Topamax 100 mg twice daily, Tramadol as needed  11/10- no pain right now- con't regimen prn  11/11- having back pain from previous fusions in neck/back - con't tramadol prn- also will ask therapy for wedge for propping.   11/16- having more pain- will con't tramadol prn- encourage usage.  4. Anxiety: Continue Xanax 0.5 mg nightly as needed             -antipsychotic agents: N/A 5. Neuropsych: This patient is capable of making decisions on his own behalf. 6. Skin/Wound Care: Routine skin checks 7. Fluids/Electrolytes/Nutrition: Routine in and outs with follow-up chemistries 8.  Atrial fibrillation.  Cardiac rate controlled.  Continue Xarelto.  Tikosyn 500 mg twice daily, Toprol-XL 25 mg daily  11/16- con't Tikosyn and Xarelto- con't regimen 9.  Acute respiratory failure hypoxia related to aspiration pneumonia.  Dysphagia #3 diet.  Oncology recommended high-dose steroids x2 weeks until 08/18/2021 followed by gradual taper of prednisone as  per Dr. [name].  Continue BiPAP/oxygen therapy  11/10- will d/w resp therapy since cannot use his home BiPAP from lacking 1 piece to attach O2- will see if Resp therapy can help with hospital one.   11/11- has slightly more cough- encouraged pt to take tessalon pearls TID- is ordered  11/12- will check another PA/Lateral CXR and schedule tessalon pearls- and see what else might be required, based on CXR; will add 2L bleed in to BiPAP and see how that goes.   11/14- called  ID just to make sure no changes to ABX- since CXR shows continued L mid/lower lobe infiltrate- will wait to hear back.   11/15- Off ABX- - stopped it; also have reached out to Dr [name] about Prednisone dosing- will taper to 80 mg daily for 1 week, then 60 mg x 1 week, and so on; to off.   11/16- will con't Prednisone wean- nebs for SOB 10.  Stage IV non-small cell lung cancer.  Palliative chemotherapy per oncology Dr. [name].  11/16- will not get chemo until prednisone down to 10 mg or less- con't prednisone gradual taper per Dr [name] 11.  Hyperlipidemia.  Lipitor/fenofibrate 12.  Parkinson's disease.  Sinemet 13.  Acute on chronic anemia.  Follow-up CBC  11/14- Hb stable at 10.1- con't regimen     LOS: 7 days A FACE TO FACE EVALUATION WAS PERFORMED  Corey [name] 08/19/2021, 8:55 AM

## 2021-08-19 NOTE — Progress Notes (Signed)
Occupational Therapy Session Note  Patient Details  Name: Corey [name] MRN: [id number] Date of Birth: 08-10-1946  Today's Date: 08/19/2021 OT Individual Time: 4098-1191 and 1400-1455 OT Individual Time Calculation (min): 73 min and 55 min   Short Term Goals: Week 1:  OT Short Term Goal 1 (Week 1): Pt will complete transfer to [facility] with LRAD MIN A OT Short Term Goal 2 (Week 1): Pt will don/doff pants min A OT Short Term Goal 3 (Week 1): Pt will perform grooming standing at sink with min A OT Short Term Goal 4 (Week 1): Pt will complete toileting tasks with LRAD and min A   Skilled Therapeutic Interventions/Progress Updates:    Session 1: Pt greeted at time of session semireclined in bed, agreeable to OT session, no pain. Donned TEDS and socks bed level with total A for time management. Pt voicing concerns this morning regarding nursing care overnight, recommended speaking with management if desired. Pt also asking what he can do prior to morning sessions to get ready, made aware he can request nursing assist with donning TEDS, getting up in chair to eat, etc. Supine > sit Supervision with bed features and extended time, switched over to portable O2 and remained on 3L throughout session. Ambulated bed > bathroom CGA and transferred to toilet same manner, able to doff clothing in standing. Pt able to perform hygiene seated with extended time and leans, able to pull new brief over hips in standing with CGA before walking to sink level same manner. O2 sats 95% s/p activity. Pt performing UB bathing/dressing seated at sink with Set up/Supervision for managing clothing and O2 tubing. LB dressing for shorts with reacher CGA to thread and sit > stand to pull over hips before walking to recliner. Educated on energy conservation for dressing techniques. Up in chair with alaarm on call bell in reach all needs met and O2 on 3L.   Session 2: Pt greeted at time of session finishing up vitals with NT, agreeable  to OT session and no pain reported throughout. Stand pivot recliner > wheelchair and switched to portable O2, transported to ortho gym. Set up on SCIFIT for the following for UB strength and global endurance: 2 mins on level 3, 2 mins at level 5, 2 mins on level 7 and O2 sats remained at 93% or higher. Note pt on 3L of O2 throughout session. Rest breaks in between each interval. UB strengthening with 3# dowel for the following 2x10-12: bicep curl, chest press, overhead press, FWD circles. Note rest breaks in between each set and O2 monitored, WNL. Discussion throughout session regarding energy conservation and DC planning for home. Self propel back to room Supervision. Stand pivot w/c > recliner CGA, switched to wall O2 and alarm on call bell in reach.   Therapy Documentation Precautions:  Precautions Precautions: Fall Precaution Comments: monitor sats  and BP Restrictions Weight Bearing Restrictions: No     Therapy/Group: Individual Therapy  Viona Gilmore 08/19/2021, 7:14 AM

## 2021-08-20 NOTE — Progress Notes (Signed)
Occupational Therapy Session Note  Patient Details  Name: Corey [name] MRN: [id number] Date of Birth: [birth year]  Today's Date: 08/21/2021 OT Individual Time: 1305-1400 OT Individual Time Calculation (min): 55 min    Skilled Therapeutic Interventions/Progress Updates:    Pt greeted in bed with spouse [name] present for family education. Pt reported having a rough morning breathing-wise. Per PA, x-ray negative and pt able to participate in therapy as able, no activity restrictions. Reviewed ADL recommendations for pt at home, sponge bathing at this time and also using the [facility] as needed. Advised placing colorful tape on 02 tubing to minimize fall risk, pt will have a concentrator at home. Discussed having HOB elevated to improve breathing abilities during sleep, also advised having a bedrail to decrease burden of care during supine<sit as wife has back issues and RA. PA advised for pt to wear Teds for edema mgt and BP control. OT educated wife on using adaptive bag method so that she can assist him with donning Teds at home. Printed them off an energy conservation handout and went over functional implications during ADL/IADL. Pt left via direct PT handoff.   Therapy Documentation Precautions:  Precautions Precautions: Fall Precaution Comments: monitor sats  and BP Restrictions Weight Bearing Restrictions: No Vital Signs: Therapy Vitals Pulse Rate: (!) 101 Resp: 15 BP: 122/73 Patient Position (if appropriate): Sitting Oxygen Therapy SpO2: 95 % O2 Device: Nasal Cannula Pain: no c/o pain during tx   ADL: ADL Equipment Provided: Other (comment) (Pt has used AE previously with success) Eating: Set up Where Assessed-Eating: Bed level Grooming: Setup Where Assessed-Grooming: Edge of bed Upper Body Bathing: Setup Where Assessed-Upper Body Bathing: Edge of bed Lower Body Bathing: Moderate assistance Where Assessed-Lower Body Bathing: Edge of bed Upper Body Dressing: Setup Where  Assessed-Upper Body Dressing: Edge of bed Lower Body Dressing: Moderate assistance Where Assessed-Lower Body Dressing: Edge of bed Toileting: Moderate assistance Where Assessed-Toileting: Bedside Commode Toilet Transfer: Minimal verbal cueing, Moderate assistance Toilet Transfer Method: Stand pivot Toilet Transfer Equipment: Bedside commode Tub/Shower Transfer: Moderate assistance Tub/Shower Transfer Method: Stand pivot Tub/Shower Equipment: Radio broadcast assistant, Energy manager: Moderate assistance Social research officer, government Method: Brewing technologist ADL Comments:  (Extra time needed for BADLs d/t fatigue, O2 sats and rest breaks) Therapy/Group: Individual Therapy  Obaloluwa Delatte A Lisvet Rasheed 08/21/2021, 3:52 PM

## 2021-08-20 NOTE — Progress Notes (Signed)
Occupational Therapy Session Note  Patient Details  Name: Corey [name] MRN: [id number] Date of Birth: 11-21-1945  Today's Date: 08/20/2021 OT Individual Time: 7897-8478 and 4128-2081 OT Individual Time Calculation (min): 57 min and 53 min Missed 22 mins 2/2 fatigue    Short Term Goals: Week 1:  OT Short Term Goal 1 (Week 1): Pt will complete transfer to [facility] with LRAD MIN A OT Short Term Goal 2 (Week 1): Pt will don/doff pants min A OT Short Term Goal 3 (Week 1): Pt will perform grooming standing at sink with min A OT Short Term Goal 4 (Week 1): Pt will complete toileting tasks with LRAD and min A  Skilled Therapeutic Interventions/Progress Updates:    Session 1: Pt greeted at time of session up in wheelchair stating his chest felt more "tight" today and nursing already aware, got nebulizer this am. No pain. Agreeable to OT session, ADL needs already met and politely declined. Donned TEDS and new socks, Supervision for donning socks in figure four seated. Self propel room > main gym with Supervision and extended time. Focused initially on standing balance and tolerance with 2x10-15 toe taps on 6" cone and single leg raise/marches in standing with O2 sats remaining 93% or higher after activity. Note pt initially feeling lightheaded, BP checked in standing at 105/66 and symptoms improved after seated rest break. Set up on SCIFIT and performed seated 3 minutes on level 5 and 3:30 on level 8 with O2 sats 96%. Transported back to room and stand pivot w/c > recliner CGA, switched over to wall O2 at 3L, alarm on call bell in reach.    Session 2: Pt greeted at time of session sitting up in recliner c/ o fatigue from previous therapy sessions today, no pain. Pt does feel like chest tightness may also be improving. Agreeable to try OT session despite fatigue. Stand pivot recliner <> wheelchair with CGA, switched to portable O2 and remained on 3L throughout session. Transported to ADL apartment and reviewed  IADL tips and energy conservation, RW management, kitchen accessibility, etc. Pt receptive to education and also demonstrating ability to access fridge/some areas of kitchen from wheelchair level if needed at home.Transported back to room and needing to toilet, stand pivot w/c <> BSC over toilet with CGA. Clothing management and hygiene with CGA in standing. Back in recliner, transferred to wall O2 at 3L, set up alarm on call bell in reach. Also provided hand out for tub transfer bench for home use as pt agrees this will be easier for showering later on. Missed 22 mins 2/2 fatigue.    Therapy Documentation Precautions:  Precautions Precautions: Fall Precaution Comments: monitor sats  and BP Restrictions Weight Bearing Restrictions: No     Therapy/Group: Individual Therapy  Viona Gilmore 08/20/2021, 7:20 AM

## 2021-08-20 NOTE — Progress Notes (Signed)
Patient ID: Corey [name], male   DOB: 02-Sep-1946, 75 y.o.   MRN: [id number] Met with pt and spoke with wife regarding education, needs at discharge and pt's care. Have scheduled wife to come in tomorrow at 1:00-3;00 pm to go through therapies with pt. Discussed his need for home O2 and nebs at home. Have reached out to [facility] who pt had before to provide therapies and request the same one to work with him. Pt also wants his brother to come in for therapies will discuss tomorrow with pt and wife when here. See wife tomorrow to answer any questions she may have

## 2021-08-20 NOTE — Progress Notes (Signed)
Occupational Therapy Weekly Progress Note  Patient Details  Name: Corey [name] MRN: [id number] Date of Birth: Oct 01, 1946  Beginning of progress report period: August 13, 2021 End of progress report period: August 20, 2021    Patient has met 3 of 4 short term goals.  Pt has made progress toward OT goals by performing ADL transfers with CGA with RW, short distance ambulation in room to/from bathroom CGA, LB bathe/dress with Min guard, toileting tasks with min guard. Pt is slowly increasing activity tolerance and global endurance, still requires 3L of O2. Plan to focus on ADL retraining and DC planning for prep home with family to assist.   Patient continues to demonstrate the following deficits: muscle weakness, decreased cardiorespiratoy endurance, impaired timing and sequencing, decreased coordination, and decreased motor planning, decreased motor planning, and decreased sitting balance, decreased standing balance, decreased postural control, and decreased balance strategies and therefore will continue to benefit from skilled OT intervention to enhance overall performance with BADL, iADL, and Reduce care partner burden.  Patient progressing toward long term goals..  Continue plan of care.  OT Short Term Goals Week 1:  OT Short Term Goal 1 (Week 1): Pt will complete transfer to [facility] with LRAD MIN A OT Short Term Goal 1 - Progress (Week 1): Met OT Short Term Goal 2 (Week 1): Pt will don/doff pants min A OT Short Term Goal 2 - Progress (Week 1): Met OT Short Term Goal 3 (Week 1): Pt will perform grooming standing at sink with min A OT Short Term Goal 3 - Progress (Week 1): Progressing toward goal OT Short Term Goal 4 (Week 1): Pt will complete toileting tasks with LRAD and min A OT Short Term Goal 4 - Progress (Week 1): Met Week 2:  OT Short Term Goal 1 (Week 2): STGs = LTGs 2/2 ELOS at Supervision    Therapy Documentation Precautions:  Precautions Precautions: Fall Precaution  Comments: monitor sats  and BP Restrictions Weight Bearing Restrictions: No   Therapy/Group: Individual Therapy  Viona Gilmore 08/20/2021, 7:20 AM

## 2021-08-20 NOTE — Progress Notes (Signed)
PROGRESS NOTE   Subjective/Complaints:  Pt reports breakfast is Bad this AM.  Having nagging morning cough. Was confused- thought was on Albuterol scheduled- he's on another neb BID and prn for albuterol.   ROS:  Pt denies SOB, abd pain, CP, N/V/C/D, and vision changes   Objective:   No results found. No results for input(s): WBC, HGB, HCT, PLT in the last 72 hours.  No results for input(s): NA, K, CL, CO2, GLUCOSE, BUN, CREATININE, CALCIUM in the last 72 hours.   Intake/Output Summary (Last 24 hours) at 08/20/2021 0905 Last data filed at 08/20/2021 0730 Gross per 24 hour  Intake 840 ml  Output 900 ml  Net -60 ml        Physical Exam: Vital Signs Blood pressure 128/78, pulse 84, temperature 97.9 F (36.6 C), temperature source Oral, resp. rate 18, height 6' 1.5" (1.867 m), weight 98.2 kg, SpO2 92 %.        General: awake, alert, appropriate, frustrated with breakfast; NAD HENT: conjugate gaze; oropharynx moist CV: regular rate; no JVD Pulmonary: decreased at bases; a few wheezes diffusely- adequate air movement.  GI: soft, NT, ND, (+)BS Psychiatric: appropriate- frustrated with breakfast Neurological: Ox3  MS: Strength 4+/5 proximally in Ue's and LE's and 5-/5 distally.    Assessment/Plan: 1. Functional deficits which require 3+ hours per day of interdisciplinary therapy in a comprehensive inpatient rehab setting. Physiatrist is providing close team supervision and 24 hour management of active medical problems listed below. Physiatrist and rehab team continue to assess barriers to discharge/monitor patient progress toward functional and medical goals  Care Tool:  Bathing    Body parts bathed by patient: Right arm, Left arm, Chest, Abdomen, Front perineal area, Face, Buttocks, Right upper leg, Left upper leg   Body parts bathed by helper: Buttocks Body parts n/a: Right lower leg, Left lower leg    Bathing assist Assist Level: Minimal Assistance - Patient > 75%     Upper Body Dressing/Undressing Upper body dressing   What is the patient wearing?: Pull over shirt    Upper body assist Assist Level: Set up assist    Lower Body Dressing/Undressing Lower body dressing      What is the patient wearing?: Incontinence brief, Pants     Lower body assist Assist for lower body dressing: Minimal Assistance - Patient > 75%     Toileting Toileting    Toileting assist Assist for toileting: Contact Guard/Touching assist     Transfers Chair/bed transfer  Transfers assist     Chair/bed transfer assist level: Minimal Assistance - Patient > 75%     Locomotion Ambulation   Ambulation assist   Ambulation activity did not occur: Safety/medical concerns  Assist level: Minimal Assistance - Patient > 75% Assistive device: Walker-rolling Max distance: 15   Walk 10 feet activity   Assist  Walk 10 feet activity did not occur: Safety/medical concerns  Assist level: Minimal Assistance - Patient > 75% Assistive device: Walker-rolling   Walk 50 feet activity   Assist Walk 50 feet with 2 turns activity did not occur: Safety/medical concerns         Walk 150 feet activity  Assist Walk 150 feet activity did not occur: Safety/medical concerns         Walk 10 feet on uneven surface  activity   Assist Walk 10 feet on uneven surfaces activity did not occur: Safety/medical concerns         Wheelchair     Assist Is the patient using a wheelchair?: Yes Type of Wheelchair: Manual Wheelchair activity did not occur: Safety/medical concerns  Wheelchair assist level: Supervision/Verbal cueing Max wheelchair distance: 70    Wheelchair 50 feet with 2 turns activity    Assist    Wheelchair 50 feet with 2 turns activity did not occur: Safety/medical concerns   Assist Level: Supervision/Verbal cueing   Wheelchair 150 feet activity     Assist   Wheelchair 150 feet activity did not occur: Safety/medical concerns   Assist Level: Moderate Assistance - Patient 50 - 74%   Blood pressure 128/78, pulse 84, temperature 97.9 F (36.6 C), temperature source Oral, resp. rate 18, height 6' 1.5" (1.867 m), weight 98.2 kg, SpO2 92 %.  Medical Problem List and Plan: 1.  Debility secondary to left lower extremity cellulitis complicated by acute respiratory failure/pneumonia/multi medical             -patient may shower             -ELOS/Goals: 2-3 weeks S-MinA             -Admit to CIR  Cd/c date 11/23- will NOT get Chemo on 11/22-not until Prednisone down to 10 mg or less per Dr [name]-   Con't PT and OT- will need to get O2 which is new at home and nebs machine- let PA know to arrange 2.  Antithrombotics: -DVT/anticoagulation:  Pharmaceutical: Xarelto.  Vascular ultrasound lower extremities negative             -antiplatelet therapy: N/A. 3. Pain: Continue Topamax 100 mg twice daily, Tramadol as needed  11/10- no pain right now- con't regimen prn  11/11- having back pain from previous fusions in neck/back - con't tramadol prn- also will ask therapy for wedge for propping.   11/16- having more pain- will con't tramadol prn- encourage usage.  4. Anxiety: Continue Xanax 0.5 mg nightly as needed             -antipsychotic agents: N/A 5. Neuropsych: This patient is capable of making decisions on his own behalf. 6. Skin/Wound Care: Routine skin checks 7. Fluids/Electrolytes/Nutrition: Routine in and outs with follow-up chemistries 8.  Atrial fibrillation.  Cardiac rate controlled.  Continue Xarelto.  Tikosyn 500 mg twice daily, Toprol-XL 25 mg daily  11/16- con't Tikosyn and Xarelto- con't regimen 9.  Acute respiratory failure hypoxia related to aspiration pneumonia.  Dysphagia #3 diet.  Oncology recommended high-dose steroids x2 weeks until 08/18/2021 followed by gradual taper of prednisone as per Dr. [name].  Continue BiPAP/oxygen  therapy  11/10- will d/w resp therapy since cannot use his home BiPAP from lacking 1 piece to attach O2- will see if Resp therapy can help with hospital one.   11/11- has slightly more cough- encouraged pt to take tessalon pearls TID- is ordered  11/12- will check another PA/Lateral CXR and schedule tessalon pearls- and see what else might be required, based on CXR; will add 2L bleed in to BiPAP and see how that goes.   11/14- called ID just to make sure no changes to ABX- since CXR shows continued L mid/lower lobe infiltrate- will wait to hear back.  11/15- Off ABX- - stopped it; also have reached out to Dr [name] about Prednisone dosing- will taper to 80 mg daily for 1 week, then 60 mg x 1 week, and so on; to off.   11/16- will con't Prednisone wean- nebs for SOB 11/17- REMINDED wife and pt- Albuterol prn nebs- ordered- reminded pt to ask for when he needs.  10.  Stage IV non-small cell lung cancer.  Palliative chemotherapy per oncology Dr. [name].  11/16- will not get chemo until prednisone down to 10 mg or less- con't prednisone gradual taper per Dr [name] 11.  Hyperlipidemia.  Lipitor/fenofibrate 12.  Parkinson's disease.  Sinemet 13.  Acute on chronic anemia.  Follow-up CBC  11/14- Hb stable at 10.1- con't regimen   I spent a total of 40 minutes on total care- >50% coordination of care- seeing pt and talking with pt's wife.     LOS: 8 days A FACE TO FACE EVALUATION WAS PERFORMED  Lashia Niese 08/20/2021, 9:05 AM

## 2021-08-20 NOTE — Progress Notes (Signed)
Physical Therapy Weekly Progress Note  Patient Details  Name: Corey [name] MRN: [id number] Date of Birth: 1946/07/27  Beginning of progress report period: August 13, 2021 End of progress report period: August 20, 2021  Today's Date: 08/20/2021 PT Individual Time: 1300-1410 PT Individual Time Calculation (min): 70 min   Patient has met 3 of 3 short term goals.  Transfers and gait with supervision- CGA at times. Mobility most limited by respiratory status.   Patient continues to demonstrate the following deficits muscle weakness and decreased cardiorespiratoy endurance and decreased oxygen support and therefore will continue to benefit from skilled PT intervention to increase functional independence with mobility.  Patient progressing toward long term goals..  Continue plan of care.  PT Short Term Goals Week 1:  PT Short Term Goal 1 (Week 1): Pt will ambulate x 50 ft with LRAD PT Short Term Goal 1 - Progress (Week 1): Met PT Short Term Goal 2 (Week 1): Pt will initiate stair training PT Short Term Goal 2 - Progress (Week 1): Met PT Short Term Goal 3 (Week 1): Pt wil tolerate x 30 min of activity with rest breaks PT Short Term Goal 3 - Progress (Week 1): Met Week 2:  PT Short Term Goal 1 (Week 2): =LTGs d/t ELOS  Skilled Therapeutic Interventions/Progress Updates:  Pt received in recliner and agreeable to therapy.  No complaint of pain. Pt does report feelings of tightness in chest all day, MD aware. Pt maintained on 3L O2 with sats >93 %throughout session. Doffed socks with supervision and donned shoes with min A.   Pt propelled w/c with BUE x 300 ft with supervision and assist to manage O2 tank for endurance and cardiovascular effort. Pt then transitioned to nustep with ambulatory transfer with CGA and RW. Pt 2x8 min at level 3 for cardiovascular endurance and LE strength. Pt reported RPE 13/20 at midpoint of 2nd bout. Discussed goals of chosen exercises and intensity to address  goals and barriers to d/c.   Gait x 30 for endurance and functional mobility before pt became fatigued and requested to propel w/c back to room. Pt returned to recliner after session with supervision Stand pivot transfer and was left with all needs in reach and alarm active.        Ambulation/gait training;Cognitive remediation/compensation;Discharge planning;DME/adaptive equipment instruction;Functional mobility training;Pain management;Psychosocial support;Therapeutic Activities;UE/LE Strength taining/ROM;Splinting/orthotics;Balance/vestibular training;Community reintegration;Disease management/prevention;Functional electrical stimulation;Visual/perceptual remediation/compensation;Neuromuscular re-education;Patient/family education;Skin care/wound management;Stair training;Therapeutic Exercise;UE/LE Coordination activities;Wheelchair propulsion/positioning   Therapy Documentation Precautions:  Precautions Precautions: Fall Precaution Comments: monitor sats  and BP Restrictions Weight Bearing Restrictions: No General:   Vital Signs:   Pain: Pain Assessment Pain Scale: 0-10 Pain Score: 0-No pain Vision/Perception     Mobility:   Locomotion :    Trunk/Postural Assessment :    Balance:   Exercises:   Other Treatments:     Therapy/Group: Individual Therapy  [name] 08/20/2021, 1:31 PM

## 2021-08-21 ENCOUNTER — Inpatient Hospital Stay (HOSPITAL_COMMUNITY): Payer: Medicare PPO

## 2021-08-21 DIAGNOSIS — R0602 Shortness of breath: Secondary | ICD-10-CM

## 2021-08-21 DIAGNOSIS — C3492 Malignant neoplasm of unspecified part of left bronchus or lung: Secondary | ICD-10-CM

## 2021-08-21 DIAGNOSIS — F4323 Adjustment disorder with mixed anxiety and depressed mood: Secondary | ICD-10-CM

## 2021-08-21 MED ORDER — PHENOL 1.4 % MT LIQD
1.0000 | OROMUCOSAL | Status: DC | PRN
  Administered 2021-08-24: 1 via OROMUCOSAL
  Filled 2021-08-21 (×2): qty 177

## 2021-08-21 MED ORDER — BIOTENE DRY MOUTH MT LIQD
15.0000 mL | OROMUCOSAL | Status: DC | PRN
  Filled 2021-08-21: qty 15

## 2021-08-21 MED ORDER — COVID-19MRNA BIVAL VACC PFIZER 30 MCG/0.3ML IM SUSP
0.3000 mL | Freq: Once | INTRAMUSCULAR | Status: AC
  Filled 2021-08-21: qty 0.3

## 2021-08-21 NOTE — Consult Note (Signed)
Neuropsychological Consultation   Patient:   Corey [name]   DOB:   Aug 07, 1946  MR Number:  [id number]  Location:  [facility] [address] C[facility] [address] [place] [id number] [place] [address] Dept: [place]: [phone]           Date of Service:   08/21/2021  Start Time:   9 AM End Time:   10 AM  Provider/Observer:  Ilean Skill, Psy.D.       Clinical Neuropsychologist       Billing Code/Service: [phone]  Chief Complaint:    Corey [name] is a 75 year old male with history of stage IV non-small cell lung cancer on palliative chemo, prostate cancer, Parkinson's disease, OSA, A. fib, CAD, hyperlipidemia, chronic low back pain with radiculopathy.  Patient with recent pneumonia.  Patient presented on 07/28/2021 with left lower extremity swelling and generalized weakness with fatigue.  He reports over a 1 week pattern of Erythema and swelling of left ankle.  Therapeutic evaluations were completed due to functional mobility deficits and he was admitted to CIR.  Reason for Service:  Patient was referred for neuropsychological consultation due to coping and adjustment issues, frustration in a setting of prior Parkinson disease and stage IV metastatic non-small cell lung cancer.  Below see HPI for the current admission.  HPI: Corey [name] is a 75 year old right-handed male with history significant for stage IV NSCLC on palliative chemo followed by Dr. [name], prostate cancer, Parkinson's disease maintained on Sinemet, OSA, atrial fibrillation maintained on Xarelto, CAD, hyperlipidemia, obesity with BMI 27.60, quit smoking 18 years ago, chronic low back pain with radiculopathy.  Patient also recently completed course of Levaquin for pneumonia.  Per chart review patient lives with spouse.  Two-level home bed and bath upstairs.  Patient was using a cane prior to admission and had been receiving home health therapies.  Presented  07/28/2021 with left lower extremity swelling and generalized weakness with fatigue.  He reports over a 1 week.  Erythema and swelling left ankle.  Patient did develop some intractable nausea and vomiting.  Chest x-ray showed unchanged left lower lobe opacity.  CT of the chest abdomen pelvis showed new widespread patchy groundglass opacities and interlobar septal thickening throughout both lungs most prominent in the right upper lobe.  Stable central medial left lower lobe pulmonary nodule.  Stable small loculated basilar left pleural effusion.  Stable caudate lobe liver metastasis no new or progressive metastatic disease in the abdomen or pelvis.  Echocardiogram with ejection fraction of 65 to 70% no wall motion abnormalities.  Admission chemistry sodium 126 glucose 110, hemoglobin 9.0, lactic acid 0.7, blood cultures no growth to date, urinalysis negative.  Placed on IV antibiotics for left lower extremity cellulitis.  Critical care did follow-up for acute respiratory failure with hypoxia likely secondary to aspiration pneumonia versus drug-induced pneumonitis and slowly weaned from oxygen.  Noted sed rate greater than 140.  He was placed on steroids with taper.  Currently on a mechanical soft diet.  Acute on chronic anemia likely secondary to his chemotherapy no overt bleeding he was transfused 2 unit packed red blood cells 08/02/2021.  Therapy evaluations completed due to patient's decreased functional mobility was admitted for a comprehensive rehab program. He currently has no new complaints.  Current Status:  Patient was awake and alert sitting up in his chair when I entered the room.  Patient was on oxygen.  Patient acknowledged frustration about various aspects of  his hospitalization.  Acknowledged practical limitations that can lead to some of his frustrations that are not because of purposeful or neglect issues but did verbalize a number of frustrations overall.  The patient described difficulties he had  had in the past with various in-home therapies and other difficulties.  Patient's personality style and traits appear that he is a rather demanding individual with the ability become hyper focused on certain aspects of his life overall and this is certainly exacerbating some of those traits.  The patient was oriented x4 with good cognition and well aware of his medical status.  Patient is continuing to maintain motivation to improve functional status and staying very actively involved in his overall medical care.  Behavioral Observation: Corey [name]  presents as a 75 y.o.-year-old Right handed Caucasian Male who appeared his stated age. his dress was Appropriate and he was Well Groomed and his manners were Appropriate to the situation.  his participation was indicative of Appropriate behaviors.  There were physical disabilities noted.  he displayed an appropriate level of cooperation and motivation.     Interactions:    Active Appropriate  Attention:   abnormal and patient tended to be distracted by internal preoccupations at times.  Memory:   within normal limits; recent and remote memory intact  Visuo-spatial:  not examined  Speech (Volume):  normal  Speech:   normal; patient with episodic issues with breathing and cough that became more pronounced as our visit progressed and he talked more.  Thought Process:  Coherent and Relevant  Though Content:  WNL; not suicidal and not homicidal  Orientation:   person, place, time/date, and situation  Judgment:   Fair  Planning:   Fair  Affect:    Irritable  Mood:    Dysphoric  Insight:   Fair  Intelligence:   high  Medical History:   Past Medical History:  Diagnosis Date   Anemia    Anxiety    Arthritis    "back, right knee, hands, ankles, neck" (05/31/2016)   Carotid artery disease ([place]) 08/30/2016   Carotid US 11/19: R 1-39, L 40-59 // Carotid US 08/2019: R 1-39; L 40-59 // Carotid US 11/21: R 1-39; L 40-59>> repeat 1 year     Chronic lower back pain    Coronary atherosclerosis of native coronary artery    a. BMS to [employer] [facility] [employer] 2004 and 2007, otherwise mild nonobstructive disease. EF normal.   Diverticulitis    Dyslipidemia    Dyspnea    Essential hypertension, benign    Family history of breast cancer    Family history of lung cancer    Family history of prostate cancer    GERD (gastroesophageal reflux disease)    Lumbar radiculopathy, chronic 02/04/2015   Right L5   Obesity    OSA on CPAP    uses cpap   Paroxysmal atrial fibrillation ([place])    a. Discovered after stroke.   Pneumonia 01/1996   PONV (postoperative nausea and vomiting)    Prostate CA ([place])    Recurrent upper respiratory infection (URI)    Stroke ([place]) 07/2012   no deficits   Visit for monitoring Tikosyn therapy 09/18/2019         Patient Active Problem List   Diagnosis Date Noted   Adjustment disorder with mixed anxiety and depressed mood    Debility 08/12/2021   Acute respiratory failure with hypoxia ([place]) 08/01/2021   Cellulitis of left leg 07/30/2021   Hypokalemia 07/30/2021   Hypomagnesemia  07/30/2021   Nausea & vomiting 07/30/2021   Cellulitis of left lower extremity    Malaise 07/28/2021   Symptomatic anemia 05/14/2021   Port-A-Cath in place 04/27/2021   Genetic testing 91/63/8466   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   COPD with acute exacerbation ([place]) 04/02/2021   Cough with hemoptysis 04/01/2021   CAP (community acquired pneumonia) 04/01/2021   Family history of prostate cancer 03/20/2021   Family history of breast cancer 03/20/2021   Family history of lung cancer 03/20/2021   Goals of care, counseling/discussion 03/18/2021   Encounter for antineoplastic chemotherapy 03/18/2021   Encounter for antineoplastic immunotherapy 03/18/2021   Primary cancer of left lower lobe of lung ([place]) 03/07/2021   Non-small cell carcinoma of left lung, stage 4 ([place]) 03/05/2021   Malnutrition of moderate degree 02/25/2021    Pleural effusion on left 02/24/2021   Hilar mass    Reactive airway disease 11/20/2020   Allergic rhinitis with non-allergic component 09/18/2020   Heartburn 09/18/2020   Coughing 09/18/2020   Malignant neoplasm of prostate ([place]) 03/04/2020   Pre-syncope 02/27/2020   Cerebral embolism with transient ischemic attack (TIA) 02/11/2020   History of stroke 59/93/5701   Embolic stroke involving cerebral artery ([place]) 02/11/2020   Secondary hypercoagulable state ([place]) 09/10/2019   Essential tremor 06/25/2019   Chest pain 05/27/2017   Demand ischemia ([place]) 05/27/2017   Morbid obesity ([place]) 05/27/2017   Carotid artery disease ([place]) 08/30/2016   Essential hypertension 08/30/2016   Diverticulitis of intestine without perforation or abscess without bleeding    Hyponatremia 06/04/2016   Diverticulitis 05/31/2016   Iron deficiency 01/05/2016   Lumbar radiculopathy, chronic 02/04/2015   SVT (supraventricular tachycardia) ([place]) 12/03/2014   PAC (premature atrial contraction) 04/05/2014   OSA on CPAP 03/27/2014   AKI (acute kidney injury) ([place]) 12/05/2013   Nausea 12/05/2013   Sepsis ([place]) 12/05/2013   Abdominal pain 12/05/2013   AF (paroxysmal atrial fibrillation) ([place]) 01/05/2013   CVA (cerebral infarction) 07/06/2012   Coronary artery disease    Benign essential HTN    Dyslipidemia    Shortness of breath    GERD (gastroesophageal reflux disease)    Psychiatric History:  Patient with past history including anxiety and does take alprazolam for his anxiety and is done so prior to his current hospitalization.  Patient acknowledges times of frustration particularly with his inability to control a wide range and increasing range of life components.  Family Med/Psych History:  Family History  Problem Relation Age of Onset   Hypertension Mother    Heart disease Father    Heart attack Father    Prostate cancer Brother 73   Parkinson's disease Brother    Lung cancer Paternal Aunt        hx of  smoking   Breast cancer Paternal Grandmother        dx 9s, bilateral mastectomies   Heart attack Paternal Grandfather    Blindness Son    Colon cancer Neg Hx    Pancreatic cancer Neg Hx     Impression/DX:  Corey [name] is a 75 year old male with history of stage IV non-small cell lung cancer on palliative chemo, prostate cancer, Parkinson's disease, OSA, A. fib, CAD, hyperlipidemia, chronic low back pain with radiculopathy.  Patient with recent pneumonia.  Patient presented on 07/28/2021 with left lower extremity swelling and generalized weakness with fatigue.  He reports over a 1 week pattern of Erythema and swelling of left ankle.  Therapeutic evaluations were completed due to  functional mobility deficits and he was admitted to CIR.  Patient was awake and alert sitting up in his chair when I entered the room.  Patient was on oxygen.  Patient acknowledged frustration about various aspects of his hospitalization.  Acknowledged practical limitations that can lead to some of his frustrations that are not because of purposeful or neglect issues but did verbalize a number of frustrations overall.  The patient described difficulties he had had in the past with various in-home therapies and other difficulties.  Patient's personality style and traits appear that he is a rather demanding individual with the ability become hyper focused on certain aspects of his life overall and this is certainly exacerbating some of those traits.  The patient was oriented x4 with good cognition and well aware of his medical status.  Patient is continuing to maintain motivation to improve functional status and staying very actively involved in his overall medical care.  Disposition/Plan:  Today we worked on coping and adjustment issues particularly with his frustration and irritation around various aspects of his hospitalization and his overall medical status and care.  Diagnosis:    Pneumonia - Plan: DG Chest 2 View, DG Chest  2 View  Shortness of breath - Plan: DG Chest 2 View, DG Chest 2 View         Electronically Signed   _______________________ Ilean Skill, Psy.D. Clinical Neuropsychologist

## 2021-08-21 NOTE — Progress Notes (Signed)
PROGRESS NOTE   Subjective/Complaints:  Pt took albuterol nebs x1; was helpful for breathing- feeling better this AM.  throat sore/dry; asking for biotene mouthwash.  Asking for covid vaccine.    ROS:  Pt denies SOB, abd pain, CP, N/V/C/D, and vision changes   Objective:   No results found. No results for input(s): WBC, HGB, HCT, PLT in the last 72 hours.  No results for input(s): NA, K, CL, CO2, GLUCOSE, BUN, CREATININE, CALCIUM in the last 72 hours.   Intake/Output Summary (Last 24 hours) at 08/21/2021 0819 Last data filed at 08/21/2021 0738 Gross per 24 hour  Intake 656 ml  Output 2125 ml  Net -1469 ml        Physical Exam: Vital Signs Blood pressure 119/69, pulse 89, temperature 98.6 F (37 C), temperature source Oral, resp. rate 20, height 6' 1.5" (1.867 m), weight 94.6 kg, SpO2 93 %.         General: awake, alert, appropriate, NAD HENT: conjugate gaze; oropharynx moist- mild erythema in oropharynx- wearing 4L O2 CV: regular rate; no JVD Pulmonary: CTA B/L; no W/R/R- good air movement GI: soft, NT, ND, (+)BS Psychiatric: appropriate- irritable about breakfast Neurological: Ox3 MS: Strength 4+/5 proximally in Ue's and LE's and 5-/5 distally.    Assessment/Plan: 1. Functional deficits which require 3+ hours per day of interdisciplinary therapy in a comprehensive inpatient rehab setting. Physiatrist is providing close team supervision and 24 hour management of active medical problems listed below. Physiatrist and rehab team continue to assess barriers to discharge/monitor patient progress toward functional and medical goals  Care Tool:  Bathing    Body parts bathed by patient: Right arm, Left arm, Chest, Abdomen, Front perineal area, Face, Buttocks, Right upper leg, Left upper leg   Body parts bathed by helper: Buttocks Body parts n/a: Right lower leg, Left lower leg   Bathing assist Assist  Level: Minimal Assistance - Patient > 75%     Upper Body Dressing/Undressing Upper body dressing   What is the patient wearing?: Pull over shirt    Upper body assist Assist Level: Set up assist    Lower Body Dressing/Undressing Lower body dressing      What is the patient wearing?: Incontinence brief, Pants     Lower body assist Assist for lower body dressing: Minimal Assistance - Patient > 75%     Toileting Toileting    Toileting assist Assist for toileting: Contact Guard/Touching assist     Transfers Chair/bed transfer  Transfers assist     Chair/bed transfer assist level: Minimal Assistance - Patient > 75%     Locomotion Ambulation   Ambulation assist   Ambulation activity did not occur: Safety/medical concerns  Assist level: Minimal Assistance - Patient > 75% Assistive device: Walker-rolling Max distance: 15   Walk 10 feet activity   Assist  Walk 10 feet activity did not occur: Safety/medical concerns  Assist level: Minimal Assistance - Patient > 75% Assistive device: Walker-rolling   Walk 50 feet activity   Assist Walk 50 feet with 2 turns activity did not occur: Safety/medical concerns         Walk 150 feet activity   Assist Walk  150 feet activity did not occur: Safety/medical concerns         Walk 10 feet on uneven surface  activity   Assist Walk 10 feet on uneven surfaces activity did not occur: Safety/medical concerns         Wheelchair     Assist Is the patient using a wheelchair?: Yes Type of Wheelchair: Manual Wheelchair activity did not occur: Safety/medical concerns  Wheelchair assist level: Supervision/Verbal cueing Max wheelchair distance: 70    Wheelchair 50 feet with 2 turns activity    Assist    Wheelchair 50 feet with 2 turns activity did not occur: Safety/medical concerns   Assist Level: Supervision/Verbal cueing   Wheelchair 150 feet activity     Assist  Wheelchair 150 feet activity  did not occur: Safety/medical concerns   Assist Level: Moderate Assistance - Patient 50 - 74%   Blood pressure 119/69, pulse 89, temperature 98.6 F (37 C), temperature source Oral, resp. rate 20, height 6' 1.5" (1.867 m), weight 94.6 kg, SpO2 93 %.  Medical Problem List and Plan: 1.  Debility secondary to left lower extremity cellulitis complicated by acute respiratory failure/pneumonia/multi medical             -patient may shower             -ELOS/Goals: 2-3 weeks S-MinA             -Admit to CIR  Cd/c date 11/23- will NOT get Chemo on 11/22-not until Prednisone down to 10 mg or less per Dr [name]-   Con't PT and OT- will get him COVID vaccine - OK with him being on steroids.  2.  Antithrombotics: -DVT/anticoagulation:  Pharmaceutical: Xarelto.  Vascular ultrasound lower extremities negative             -antiplatelet therapy: N/A. 3. Pain: Continue Topamax 100 mg twice daily, Tramadol as needed  11/10- no pain right now- con't regimen prn  11/11- having back pain from previous fusions in neck/back - con't tramadol prn- also will ask therapy for wedge for propping.   11/18- pain controlled- con't regimen  4. Anxiety: Continue Xanax 0.5 mg nightly as needed             -antipsychotic agents: N/A 5. Neuropsych: This patient is capable of making decisions on his own behalf. 6. Skin/Wound Care: Routine skin checks 7. Fluids/Electrolytes/Nutrition: Routine in and outs with follow-up chemistries 8.  Atrial fibrillation.  Cardiac rate controlled.  Continue Xarelto.  Tikosyn 500 mg twice daily, Toprol-XL 25 mg daily  11/16- con't Tikosyn and Xarelto- con't regimen 9.  Acute respiratory failure hypoxia related to aspiration pneumonia.  Dysphagia #3 diet.  Oncology recommended high-dose steroids x2 weeks until 08/18/2021 followed by gradual taper of prednisone as per Dr. [name].  Continue BiPAP/oxygen therapy   11/17- REMINDED wife and pt- Albuterol prn nebs- ordered- reminded pt to ask  for when he needs.   11/18- took labuterol prn- was very helpful- con't O2, scheduled nebs and prn 10.  Stage IV non-small cell lung cancer.  Palliative chemotherapy per oncology Dr. [name].  11/16- will not get chemo until prednisone down to 10 mg or less- con't prednisone gradual taper per Dr [name] 11.  Hyperlipidemia.  Lipitor/fenofibrate 12.  Parkinson's disease.  Sinemet 13.  Acute on chronic anemia.  Follow-up CBC  11/14- Hb stable at 10.1- con't regimen 14. Sore/dry throat  11/18- will order Biotene; and Throat spray- Chloroseptic.     LOS: 9 days A  FACE TO FACE EVALUATION WAS PERFORMED  Annastacia Duba 08/21/2021, 8:19 AM

## 2021-08-21 NOTE — Progress Notes (Signed)
Patient complaining of shortness of breathe this morning upon getting out of bed with nurse tech to go to the bathroom, pt given all 3 scheduled breathing treatments with improvement of breathing after treatment.    Was called into room an hour later to pt stating he felt like he was "gasping for air", called PA to come down to see pt. O2 was checked to be sating in 70s-80s. Pt given PRN albuterol neb treatment, taken for a stat chest xray, vitals taken. Charge nurse made aware of situation. Pt started feeling nauseated, threw up a small amount of emesis. Pt given PRN zofran, and Tussinex  for cough.   Pt arrived back from xray in bed, and stated he was feeling better. O2 running at 4L, sating at 98%.   Charge nurse Deidre Ala, and [place] PA made aware.   Dayna Ramus

## 2021-08-21 NOTE — Progress Notes (Signed)
Pt has home unit, will call when ready.

## 2021-08-21 NOTE — Progress Notes (Signed)
Physical Therapy Session Note  Patient Details  Name: WILIAN KWONG MRN: [id number] Date of Birth: [birth year]  Today's Date: 08/21/2021 PT Individual Time: 1400-1506 PT Individual Time Calculation (min): 66 min   Short Term Goals: Week 1:  PT Short Term Goal 1 (Week 1): Pt will ambulate x 50 ft with LRAD PT Short Term Goal 1 - Progress (Week 1): Met PT Short Term Goal 2 (Week 1): Pt will initiate stair training PT Short Term Goal 2 - Progress (Week 1): Met PT Short Term Goal 3 (Week 1): Pt wil tolerate x 30 min of activity with rest breaks PT Short Term Goal 3 - Progress (Week 1): Met Week 2:  PT Short Term Goal 1 (Week 2): =LTGs d/t ELOS  Skilled Therapeutic Interventions/Progress Updates:    Pt in bed on arrival with his wife present for family education. Session focused on discussion of mobility, energy conservation, safe guarding, discharge plans, etc. Pt performed bed mobility from flat bed with bed rail with CGA and VC. Discussed need for bed rail and showed example of appropriate types online. Sit to stand and Stand pivot transfer with min A for balance and power up. Pt also navigated 6" step x 2 with min A and BIL handrails. After this, O2=92 and HR=120 bpm. Discussed implications for stair management at home. Pt's wife then instructed on and practiced stair guarding with therapist. Discussion of safe guarding and energy conservation strategies throughout. Pt returned to bed in same manner and was left with all needs in reach and alarm active, his wife present.   Therapy Documentation Precautions:  Precautions Precautions: Fall Precaution Comments: monitor sats  and BP Restrictions Weight Bearing Restrictions: No     Therapy/Group: Individual Therapy  [name] 08/21/2021, 4:13 PM

## 2021-08-21 NOTE — Progress Notes (Signed)
Patient complained of shortness of breath, PA made aware by LPN and at bedside, chest x-ray done. This RN assessed patient, vital signs improving, patient states he is feeling better. Will continue to monitor

## 2021-08-21 NOTE — Progress Notes (Signed)
Patient ID: Corey [name], male   DOB: Dec 22, 1945, 75 y.o.   MRN: [id number]  Met with pt and wife who is here for education, pt is not having a good day today and SOB. Wife walked through OT & PT and is aware of his care needs. His twin brother will be here tomorrow to go through therapies with. Discussed O2 and nebs for home, no preferred provider. Will make referrals and work on discharge needs.

## 2021-08-21 NOTE — Progress Notes (Signed)
Physical Therapy Session Note  Patient Details  Name: Corey [name] MRN: [id number] Date of Birth: [birth year]  Today's Date: 08/21/2021 PT Missed Time: 75 Minutes Missed Time Reason: Patient ill (Comment) (extreme SOB, receiving nebulizer, desaturating on 4L O2)  Pt seated with PA and nursing providing nebulizer treatment for extreme shortness of breath and desaturation. Pt was unable to participate at this time and missed 75 minutes of scheduled PT. Therapist consulted with CSW and PA for plan of action and d/c planning needs. Will attempt to follow up as able.      [name] 08/21/2021, 10:36 AM

## 2021-08-22 DIAGNOSIS — Z9981 Dependence on supplemental oxygen: Secondary | ICD-10-CM

## 2021-08-22 DIAGNOSIS — D649 Anemia, unspecified: Secondary | ICD-10-CM

## 2021-08-22 DIAGNOSIS — J020 Streptococcal pharyngitis: Secondary | ICD-10-CM

## 2021-08-22 DIAGNOSIS — M549 Dorsalgia, unspecified: Secondary | ICD-10-CM

## 2021-08-22 DIAGNOSIS — G8929 Other chronic pain: Secondary | ICD-10-CM

## 2021-08-22 MED ORDER — COVID-19MRNA BIVAL VACC PFIZER 30 MCG/0.3ML IM SUSP
0.3000 mL | Freq: Once | INTRAMUSCULAR | Status: AC
  Administered 2021-08-22: 0.3 mL via INTRAMUSCULAR
  Filled 2021-08-22: qty 0.3

## 2021-08-22 NOTE — Progress Notes (Signed)
Occupational Therapy Session Note  Patient Details  Name: Corey [name] MRN: [id number] Date of Birth: 1946-07-10  Today's Date: 08/22/2021 OT Individual Time: 1015-1058 OT Individual Time Calculation (min): 43 min    Short Term Goals: Week 1:  OT Short Term Goal 1 (Week 1): Pt will complete transfer to [facility] with LRAD MIN A OT Short Term Goal 1 - Progress (Week 1): Met OT Short Term Goal 2 (Week 1): Pt will don/doff pants min A OT Short Term Goal 2 - Progress (Week 1): Met OT Short Term Goal 3 (Week 1): Pt will perform grooming standing at sink with min A OT Short Term Goal 3 - Progress (Week 1): Progressing toward goal OT Short Term Goal 4 (Week 1): Pt will complete toileting tasks with LRAD and min A OT Short Term Goal 4 - Progress (Week 1): Met Week 2:  OT Short Term Goal 1 (Week 2): STGs = LTGs 2/2 ELOS at Supervision   Skilled Therapeutic Interventions/Progress Updates:    Pt greeted at time of session supine in bed resting with NT present stating the pt has urinated in bed x2 this am. Note pt's O2 was backwards, flowing to back of head and not in nose. Pt c/o feeling very tired this am and has since yesterday, checked notes that state CXR is negative and pt aware. NT relaying pt wife requests for call regarding therapy updates. Spoke with wife renee with pt present and reviewed CLOF, stand pivot transfers, toileting skill performance, wife appreicative. Supine > sit Min/Mod A for trunk elevation, sitting EOB donned TEDS and new scrub pants with Mod A. Pt needing to toilet, c/o intense dizziness and switched from w/c transport to bathroom to [facility] by bed 2/2 dizziness. Stand pivot bed > BSC Min A with extended time and shuffling steps. Seated on commode checked BP at 112/63. Pt needing extended time on toilet, hand off to NT who is aware pt on BSC 2/2 time constraints for OT session. Pt on O2 via [place] at 4L 2/2 exertion.   Therapy Documentation Precautions:  Precautions Precautions:  Fall Precaution Comments: monitor sats  and BP Restrictions Weight Bearing Restrictions: No     Therapy/Group: Individual Therapy  Viona Gilmore 08/22/2021, 10:25 AM

## 2021-08-22 NOTE — Progress Notes (Signed)
Physical Therapy Session Note  Patient Details  Name: Corey [name] MRN: [id number] Date of Birth: 1945-11-22  Today's Date: 08/22/2021 PT Individual Time: 9935-7017 PT Individual Time Calculation (min): 57 min   Short Term Goals: Week 1:  PT Short Term Goal 1 (Week 1): Pt will ambulate x 50 ft with LRAD PT Short Term Goal 1 - Progress (Week 1): Met PT Short Term Goal 2 (Week 1): Pt will initiate stair training PT Short Term Goal 2 - Progress (Week 1): Met PT Short Term Goal 3 (Week 1): Pt wil tolerate x 30 min of activity with rest breaks PT Short Term Goal 3 - Progress (Week 1): Met Week 2:  PT Short Term Goal 1 (Week 2): =LTGs d/t ELOS  Skilled Therapeutic Interventions/Progress Updates:  Pt resting in bed.  He denied pain, but stated that he is fatigued due to tapering off of Prednisone dosage. Pt's TEDs were around his ankles; PT pulled them up and educated pt on keeping them pulled up to knee while donned.  Pt verbalized understanding.  Pt on 4L O2 now, since last night, pt reported.  Previously he was on room air, but he reported SOB yesterday which he attributes to Prednisone tapering.  PT consulted with Marjorie Smolder, RN about this.   Therapeutic exercise performed with trunk and LEs to increase strength for functional mobility: 15 x 2 cervical flexion, bil ankle pumps; 15 x 1 R/L straight leg raises, 10 x 1 bil bridging, 10 x 1 bil bridging with adductor squeezes.  Exercise HR 99-105; O2 sats 90-91%.   Pt reported he needed to urinate.  Supine> sit with bed features and min assist.  Sit> stand to RW with  CGA.  Gait training in crowded environment, CGA.  Ambulatory transfer to sit on BSC over toilet, CGA and mod cues.  O2 sats 90% and HR 125 upon sitting. PT encouraged pt to breathe deeply and slowly.   Pt exhausted but felt he could ambulate back to bed.  Hand cleaner provided with pt seated.  Sit> stand with CGA; gait training to return to bed , CGA.18'.  Cues for hand placement for  sit>< stand. Sit> supine with min assist for RLE, and bed rail.    At end of session, pt resting in bed with alarm set, needs at hand, on 4L O2 via [place].     Therapy Documentation Precautions:  Precautions Precautions: Fall Precaution Comments: monitor sats  and BP Restrictions Weight Bearing Restrictions: No   Vital Signs: Oxygen Therapy SpO2: 95 % O2 Device: Joy, on 4L        Therapy/Group: Individual Therapy  Ginger Leeth 08/22/2021, 2:03 PM

## 2021-08-22 NOTE — Progress Notes (Signed)
PROGRESS NOTE   Subjective/Complaints: Patient seen sitting up in bed this morning.  He states he slept fairly overnight due to urinary frequency.  He also notes a cough this morning, but notes he has not received a breathing treatment yet.  ROS: Denies CP, SOB, N/V/D  Objective:   DG Chest 2 View  Result Date: 08/21/2021 CLINICAL DATA:  Shortness of breath EXAM: CHEST - 2 VIEW COMPARISON:  Chest x-ray dated August 15, 2021 FINDINGS: Cardiac and mediastinal contours are unchanged. Right chest wall port is unchanged in position. Right greater than left basilar predominant opacities, unchanged compared to prior exam. No new focal opacity. No large pleural effusion or evidence of pneumothorax. IMPRESSION: Unchanged left greater than right basilar predominant heterogeneous opacities. Electronically Signed   By: Yetta Glassman M.D.   On: 08/21/2021 10:33   No results for input(s): WBC, HGB, HCT, PLT in the last 72 hours.  No results for input(s): NA, K, CL, CO2, GLUCOSE, BUN, CREATININE, CALCIUM in the last 72 hours.   Intake/Output Summary (Last 24 hours) at 08/22/2021 0959 Last data filed at 08/22/2021 0700 Gross per 24 hour  Intake 600 ml  Output 500 ml  Net 100 ml         Physical Exam: Vital Signs Blood pressure 127/75, pulse 89, temperature 99.6 F (37.6 C), temperature source Oral, resp. rate 12, height 6' 1.5" (1.867 m), weight 94.6 kg, SpO2 95 %. Constitutional: No distress . Vital signs reviewed. HENT: Normocephalic.  Atraumatic. Eyes: EOMI. No discharge. Cardiovascular: No JVD.  RRR. Respiratory: Normal effort.  No stridor.  No wheezing.  + [place]. GI: Non-distended.  BS +. Skin: Warm and dry.  Intact. Psych: Normal mood.  Normal behavior. Musc: No edema in extremities.  No tenderness in extremities. Neuro: Alert Motor: Strength 4+/5 proximally in Ue's and LE's and 5-/5 distally, stable.  Assessment/Plan: 1.  Functional deficits which require 3+ hours per day of interdisciplinary therapy in a comprehensive inpatient rehab setting. Physiatrist is providing close team supervision and 24 hour management of active medical problems listed below. Physiatrist and rehab team continue to assess barriers to discharge/monitor patient progress toward functional and medical goals  Care Tool:  Bathing    Body parts bathed by patient: Right arm, Left arm, Chest, Abdomen, Front perineal area, Face, Buttocks, Right upper leg, Left upper leg   Body parts bathed by helper: Buttocks Body parts n/a: Right lower leg, Left lower leg   Bathing assist Assist Level: Minimal Assistance - Patient > 75%     Upper Body Dressing/Undressing Upper body dressing   What is the patient wearing?: Pull over shirt    Upper body assist Assist Level: Set up assist    Lower Body Dressing/Undressing Lower body dressing      What is the patient wearing?: Incontinence brief, Pants     Lower body assist Assist for lower body dressing: Minimal Assistance - Patient > 75%     Toileting Toileting    Toileting assist Assist for toileting: Contact Guard/Touching assist     Transfers Chair/bed transfer  Transfers assist     Chair/bed transfer assist level: Minimal Assistance - Patient > 75%  Locomotion Ambulation   Ambulation assist   Ambulation activity did not occur: Safety/medical concerns  Assist level: Minimal Assistance - Patient > 75% Assistive device: Walker-rolling Max distance: 15   Walk 10 feet activity   Assist  Walk 10 feet activity did not occur: Safety/medical concerns  Assist level: Minimal Assistance - Patient > 75% Assistive device: Walker-rolling   Walk 50 feet activity   Assist Walk 50 feet with 2 turns activity did not occur: Safety/medical concerns         Walk 150 feet activity   Assist Walk 150 feet activity did not occur: Safety/medical concerns         Walk 10  feet on uneven surface  activity   Assist Walk 10 feet on uneven surfaces activity did not occur: Safety/medical concerns         Wheelchair     Assist Is the patient using a wheelchair?: Yes Type of Wheelchair: Manual Wheelchair activity did not occur: Safety/medical concerns  Wheelchair assist level: Supervision/Verbal cueing Max wheelchair distance: 70    Wheelchair 50 feet with 2 turns activity    Assist    Wheelchair 50 feet with 2 turns activity did not occur: Safety/medical concerns   Assist Level: Supervision/Verbal cueing   Wheelchair 150 feet activity     Assist  Wheelchair 150 feet activity did not occur: Safety/medical concerns   Assist Level: Moderate Assistance - Patient 50 - 74%   Blood pressure 127/75, pulse 89, temperature 99.6 F (37.6 C), temperature source Oral, resp. rate 12, height 6' 1.5" (1.867 m), weight 94.6 kg, SpO2 95 %.  Medical Problem List and Plan: 1.  Debility secondary to left lower extremity cellulitis complicated by acute respiratory failure/pneumonia/multi medical  Continue CIR 2.  Antithrombotics: -DVT/anticoagulation:  Pharmaceutical: Xarelto.  Vascular ultrasound lower extremities negative             -antiplatelet therapy: N/A. 3. Pain: Continue Topamax 100 mg twice daily, Tramadol as needed  11/11- having back pain from previous fusions in neck/back - con't tramadol prn- also will ask therapy for wedge for propping.   Controlled with meds on 11/19 4. Anxiety: Continue Xanax 0.5 mg nightly as needed             -antipsychotic agents: N/A 5. Neuropsych: This patient is capable of making decisions on his own behalf. 6. Skin/Wound Care: Routine skin checks 7. Fluids/Electrolytes/Nutrition: Routine in and outs 8.  Atrial fibrillation.  Cardiac rate controlled.  Continue Xarelto.  Tikosyn 500 mg twice daily, Toprol-XL 25 mg daily  11/16- con't Tikosyn and Xarelto- con't regimen  Rate controlled on 11/19 9.  Acute  respiratory failure hypoxia related to aspiration pneumonia.  Oncology recommended high-dose steroids x2 weeks until 08/18/2021 followed by gradual taper of prednisone as per Dr. [name].  Continue BiPAP/oxygen therapy  Advance to regular thins  Albuterol prn nebs- ordered- reminded pt to ask for when he needs.  10.  Stage IV non-small cell lung cancer.  Palliative chemotherapy per oncology Dr. [name].  11/16- will not get chemo until prednisone down to 10 mg or less- con't prednisone gradual taper per Dr [name]  Supplemental oxygen dependent 11.  Hyperlipidemia.  Lipitor/fenofibrate 12.  Parkinson's disease.  Sinemet 13.  Acute on chronic anemia.  Follow-up CBC  Hemoglobin 10.1 on 11/13  Continue to monitor 14. Sore/dry throat  Biotene; and Throat spray, Chloraseptic ordered  LOS: 10 days A FACE TO FACE EVALUATION WAS PERFORMED  Yuji Walth Lorie Phenix 08/22/2021, 9:59  AM

## 2021-08-24 MED ORDER — PREDNISONE 20 MG PO TABS: 20.0000 mg | ORAL_TABLET | Freq: Every day | ORAL | Status: DC

## 2021-08-24 MED ORDER — SENNOSIDES-DOCUSATE SODIUM 8.6-50 MG PO TABS
1.0000 | ORAL_TABLET | Freq: Two times a day (BID) | ORAL | Status: DC
  Administered 2021-08-24 – 2021-08-28 (×9): 1 via ORAL
  Filled 2021-08-24 (×11): qty 1

## 2021-08-24 MED ORDER — PREDNISONE 20 MG PO TABS: 40.0000 mg | ORAL_TABLET | Freq: Every day | ORAL | Status: DC

## 2021-08-24 MED ORDER — PREDNISONE 20 MG PO TABS: 60.0000 mg | ORAL_TABLET | Freq: Every day | ORAL | Status: DC

## 2021-08-24 MED ORDER — SORBITOL 70 % SOLN
30.0000 mL | Freq: Every day | Status: DC | PRN
  Administered 2021-08-24: 30 mL via ORAL
  Filled 2021-08-24: qty 30

## 2021-08-24 MED ORDER — PREDNISONE 10 MG PO TABS: 10.0000 mg | ORAL_TABLET | Freq: Every day | ORAL | Status: DC

## 2021-08-24 MED ORDER — SALINE SPRAY 0.65 % NA SOLN
1.0000 | NASAL | Status: DC | PRN
  Administered 2021-08-24: 1 via NASAL
  Filled 2021-08-24: qty 44

## 2021-08-24 MED ORDER — PREDNISONE 20 MG PO TABS
60.0000 mg | ORAL_TABLET | Freq: Every day | ORAL | Status: DC
  Administered 2021-08-25 – 2021-08-29 (×5): 60 mg via ORAL
  Filled 2021-08-24 (×5): qty 3

## 2021-08-24 MED ORDER — POLYETHYLENE GLYCOL 3350 17 G PO PACK
17.0000 g | PACK | Freq: Every day | ORAL | Status: DC
  Administered 2021-08-25 – 2021-08-29 (×5): 17 g via ORAL
  Filled 2021-08-24 (×5): qty 1

## 2021-08-24 NOTE — Progress Notes (Signed)
PROGRESS NOTE   Subjective/Complaints:  Pt reports throat sore and dry- hasn't gotten Biotene or throat spray- let nursing know- had episode where O2 dropped this AM- per 1 staff member, O2 tank wasn't working right and O2 sats droppe-d post situation, his O2 levels resolved and doing better.   Asking about going home Wednesday- not sure he wants to leave- went over plans for d/c with pt including O2 issues.   ROS:   Pt denies SOB, abd pain, CP, N/V/C/D, and vision changes    Objective:   No results found. No results for input(s): WBC, HGB, HCT, PLT in the last 72 hours.  No results for input(s): NA, K, CL, CO2, GLUCOSE, BUN, CREATININE, CALCIUM in the last 72 hours.   Intake/Output Summary (Last 24 hours) at 08/24/2021 1401 Last data filed at 08/24/2021 0100 Gross per 24 hour  Intake 476 ml  Output 1350 ml  Net -874 ml        Physical Exam: Vital Signs Blood pressure 110/71, pulse (!) 101, temperature 98.1 F (36.7 C), resp. rate 18, height 6' 1.5" (1.867 m), weight 95.8 kg, SpO2 93 %.    General: awake, alert, appropriate, NAD HENT: conjugate gaze; oropharynx moist CV: regular rate; no JVD Pulmonary: good air movement- no W/R/R GI: soft, NT, ND, (+)BS Psychiatric: appropriate but irritable about food;  Neurological: alert  Musc: No edema in extremities.  No tenderness in extremities. Neuro: Alert Motor: Strength 4+/5 proximally in Ue's and LE's and 5-/5 distally, stable.  Assessment/Plan: 1. Functional deficits which require 3+ hours per day of interdisciplinary therapy in a comprehensive inpatient rehab setting. Physiatrist is providing close team supervision and 24 hour management of active medical problems listed below. Physiatrist and rehab team continue to assess barriers to discharge/monitor patient progress toward functional and medical goals  Care Tool:  Bathing    Body parts bathed by  patient: Right arm, Left arm, Chest, Abdomen, Front perineal area, Face, Buttocks, Right upper leg, Left upper leg   Body parts bathed by helper: Buttocks Body parts n/a: Right lower leg, Left lower leg   Bathing assist Assist Level: Minimal Assistance - Patient > 75%     Upper Body Dressing/Undressing Upper body dressing   What is the patient wearing?: Pull over shirt    Upper body assist Assist Level: Set up assist    Lower Body Dressing/Undressing Lower body dressing      What is the patient wearing?: Incontinence brief, Pants     Lower body assist Assist for lower body dressing: Minimal Assistance - Patient > 75%     Toileting Toileting    Toileting assist Assist for toileting: Contact Guard/Touching assist     Transfers Chair/bed transfer  Transfers assist     Chair/bed transfer assist level: Contact Guard/Touching assist     Locomotion Ambulation   Ambulation assist   Ambulation activity did not occur: Safety/medical concerns  Assist level: Contact Guard/Touching assist Assistive device: Walker-rolling Max distance: 18   Walk 10 feet activity   Assist  Walk 10 feet activity did not occur: Safety/medical concerns  Assist level: Contact Guard/Touching assist Assistive device: Walker-rolling   Walk 50 feet  activity   Assist Walk 50 feet with 2 turns activity did not occur: Safety/medical concerns         Walk 150 feet activity   Assist Walk 150 feet activity did not occur: Safety/medical concerns         Walk 10 feet on uneven surface  activity   Assist Walk 10 feet on uneven surfaces activity did not occur: Safety/medical concerns         Wheelchair     Assist Is the patient using a wheelchair?: Yes Type of Wheelchair: Manual Wheelchair activity did not occur: Safety/medical concerns  Wheelchair assist level: Supervision/Verbal cueing Max wheelchair distance: 70    Wheelchair 50 feet with 2 turns  activity    Assist    Wheelchair 50 feet with 2 turns activity did not occur: Safety/medical concerns   Assist Level: Supervision/Verbal cueing   Wheelchair 150 feet activity     Assist  Wheelchair 150 feet activity did not occur: Safety/medical concerns   Assist Level: Moderate Assistance - Patient 50 - 74%   Blood pressure 110/71, pulse (!) 101, temperature 98.1 F (36.7 C), resp. rate 18, height 6' 1.5" (1.867 m), weight 95.8 kg, SpO2 93 %.  Medical Problem List and Plan: 1.  Debility secondary to left lower extremity cellulitis complicated by acute respiratory failure/pneumonia/multi medical  Con't PT and OT/CIR 2.  Antithrombotics: -DVT/anticoagulation:  Pharmaceutical: Xarelto.  Vascular ultrasound lower extremities negative             -antiplatelet therapy: N/A. 3. Pain: Continue Topamax 100 mg twice daily, Tramadol as needed  11/11- having back pain from previous fusions in neck/back - con't tramadol prn- also will ask therapy for wedge for propping.   11/21- pain controlled- con't regimen 4. Anxiety: Continue Xanax 0.5 mg nightly as needed             -antipsychotic agents: N/A 5. Neuropsych: This patient is capable of making decisions on his own behalf. 6. Skin/Wound Care: Routine skin checks 7. Fluids/Electrolytes/Nutrition: Routine in and outs 8.  Atrial fibrillation.  Cardiac rate controlled.  Continue Xarelto.  Tikosyn 500 mg twice daily, Toprol-XL 25 mg daily  11/16- con't Tikosyn and Xarelto- con't regimen  Rate controlled on 11/19 9.  Acute respiratory failure hypoxia related to aspiration pneumonia.  Oncology recommended high-dose steroids x2 weeks until 08/18/2021 followed by gradual taper of prednisone as per Dr. Earlie [name].  Continue BiPAP/oxygen therapy  Advance to regular thins  Albuterol prn nebs- ordered- reminded pt to ask for when he needs. 11/21- pt to go home on O2 and Nebs- wife needs ot be trained how to do nebs.   10.  Stage IV non-small  cell lung cancer.  Palliative chemotherapy per oncology Dr. Julien [name].  11/16- will not get chemo until prednisone down to 10 mg or less- con't prednisone gradual taper per Dr Corey [name]  Supplemental oxygen dependent  11/21- will not start chemo until at 10 mg prednisone- also wrote to taper to 60 mg daily as of tomorrow 11.  Hyperlipidemia.  Lipitor/fenofibrate 12.  Parkinson's disease.  Sinemet 13.  Acute on chronic anemia.  Follow-up CBC  Hemoglobin 10.1 on 11/13  Continue to monitor 14. Sore/dry throat  Biotene; and Throat spray, Chloraseptic ordered  11/21- asked nursing to get for pt.    LOS: 12 days A FACE TO FACE EVALUATION WAS PERFORMED  Corey [name] 08/24/2021, 2:01 PM

## 2021-08-24 NOTE — Discharge Summary (Signed)
Physician Discharge Summary  Patient ID: Corey [name] MRN: [id number] DOB/AGE: [birth year] 74 y.o.  Admit date: 08/12/2021 Discharge date: 08/26/2021  Discharge Diagnoses:  Principal Problem:   Debility Active Problems:   Shortness of breath   Non-small cell carcinoma of left lung, stage 4 (HCC)   Adjustment disorder with mixed anxiety and depressed mood   Streptococcal sore throat   Acute on chronic anemia   Supplemental oxygen dependent   Notalgia Hyperlipidemia Parkinson's disease  Discharged Condition: Stable  Significant Diagnostic Studies: DG Chest 2 View  Result Date: 08/21/2021 CLINICAL DATA:  Shortness of breath EXAM: CHEST - 2 VIEW COMPARISON:  Chest x-ray dated August 15, 2021 FINDINGS: Cardiac and mediastinal contours are unchanged. Right chest wall port is unchanged in position. Right greater than left basilar predominant opacities, unchanged compared to prior exam. No new focal opacity. No large pleural effusion or evidence of pneumothorax. IMPRESSION: Unchanged left greater than right basilar predominant heterogeneous opacities. Electronically Signed   By: Yetta Glassman M.D.   On: 08/21/2021 10:33   DG Chest 2 View  Result Date: 08/15/2021 CLINICAL DATA:  Pneumonia. EXAM: CHEST - 2 VIEW COMPARISON:  Previous studies including the examination of 08/03/2021 FINDINGS: Transverse diameter of heart is increased. There are ground-glass and patchy alveolar infiltrates in both lungs, more so on the left side. There is significant interval decrease in ground-glass infiltrates in right lung with small residual foci. There is also decrease in alveolar infiltrates in left mid and left lower lung fields. There is blunting of left lateral CP angle. There is no pneumothorax. IMPRESSION: Cardiomegaly. There is interval decrease in the ground-glass and alveolar infiltrates in both lungs. Small residual foci of ground-glass densities seen in the right lung. Still, there is significant  alveolar infiltrate in the left mid and left lower lung fields suggesting pneumonia. Possible small left pleural effusion. Electronically Signed   By: Elmer Picker M.D.   On: 08/15/2021 10:38   DG Chest 2 View  Result Date: 07/30/2021 CLINICAL DATA:  Stage IV lung cancer, nausea and vomiting EXAM: CHEST - 2 VIEW COMPARISON:  07/28/2021 chest radiograph. FINDINGS: Stable configuration of right internal jugular Port-A-Cath and left chest loop recorder. Stable cardiomediastinal silhouette with top-normal heart size. No pneumothorax. Chronic mild blunting of the left costophrenic angle. No right pleural effusion. Volume loss and patchy consolidation in the mid to lower left lung, unchanged. No overt pulmonary edema. New faint patchy opacity in the right mid lung. IMPRESSION: 1. New faint patchy opacity in the right mid lung, differential includes mild atelectasis, aspiration or pneumonia. 2. Stable volume loss and patchy consolidation in the mid to lower left lung compatible with post treatment change. Electronically Signed   By: Ilona Sorrel M.D.   On: 07/30/2021 13:34   CT Chest W Contrast  Addendum Date: 07/31/2021   ADDENDUM REPORT: 07/31/2021 15:31 ADDENDUM: Dilated 2.9 cm infrarenal abdominal aorta. Recommend follow-up ultrasound every 5 years. This recommendation follows ACR consensus guidelines: White Paper of the ACR Incidental Findings Committee II on Vascular Findings. J Am Coll Radiol 2013; 10:789-794. Electronically Signed   By: Ilona Sorrel M.D.   On: 07/31/2021 15:31   Result Date: 07/31/2021 CLINICAL DATA:  Stage IV non-small cell left lung cancer diagnosed May 2022 status post palliative radiation therapy completed 03/23/2021 and chemoimmunotherapy. Patient admitted with pneumonia, dyspnea and lower extremity swelling. Restaging. EXAM: CT CHEST, ABDOMEN, AND PELVIS WITH CONTRAST TECHNIQUE: Multidetector CT imaging of the chest, abdomen and pelvis was performed  following the standard  protocol during bolus administration of intravenous contrast. CONTRAST:  4mL OMNIPAQUE IOHEXOL 350 MG/ML SOLN COMPARISON:  06/02/2021 CT chest, abdomen and pelvis. FINDINGS: CT CHEST FINDINGS Cardiovascular: Top-normal heart size. Stable small pericardial effusion/thickening. Three-vessel coronary atherosclerosis. Right internal jugular Port-A-Cath terminates in the lower third of the SVC. Atherosclerotic thoracic aorta with stable dilated 4.0 cm ascending thoracic aorta. Dilated main pulmonary artery (4.6 cm diameter). No central pulmonary emboli. Mediastinum/Nodes: No discrete thyroid nodules. Mild circumferential wall thickening in the lower thoracic esophagus, not definitely changed. No pathologically enlarged axillary or mediastinal lymph nodes (previously described mildly enlarged subcarinal lymph node is favored to represent the collapsed esophagus in retrospect). Mildly enlarged 1.3 cm right hilar node (series 2/image 31), increased mildly from 1.0 cm. Lungs/Pleura: No pneumothorax. Trace dependent right pleural effusion, new. Small loculated basilar left pleural effusion, stable. Central medial left lower lobe 2.9 x 2.3 cm solid pulmonary nodule (series 2/image 34), previously 2.9 x 2.2 cm using similar measurement technique, not appreciably changed. New widespread patchy ground-glass opacity and interlobular septal thickening (crazy paving pattern) throughout both lungs, most prominent in right upper lobe. Dense patchy left perihilar and lower left lung consolidation with associated bronchiectasis, volume loss and distortion, similar to mildly increased, favor evolving postradiation change. No new significant pulmonary nodules. Musculoskeletal: No aggressive appearing focal osseous lesions. Moderate thoracic spondylosis. CT ABDOMEN PELVIS FINDINGS Hepatobiliary: Hypodense 2.6 x 1.5 cm caudate lobe liver mass (series 2/image 59), previously 2.6 x 1.7 cm using similar measurement technique, not appreciably  changed. No additional liver lesions. Normal gallbladder with no radiopaque cholelithiasis. No biliary ductal dilatation. Pancreas: Normal, with no mass or duct dilation. Spleen: Normal size. No mass. Adrenals/Urinary Tract: Normal adrenals. No hydronephrosis. Simple 1.3 cm anterior interpolar left renal cyst. Normal bladder. Stomach/Bowel: Normal non-distended stomach. Normal caliber small bowel with no small bowel wall thickening. Normal appendix. Moderate sigmoid diverticulosis with no large bowel wall thickening or significant pericolonic fat stranding. Vascular/Lymphatic: Atherosclerotic abdominal aorta with dilated 2.9 cm infrarenal abdominal aorta. Patent portal, splenic, hepatic and renal veins. No pathologically enlarged lymph nodes in the abdomen or pelvis. Reproductive: Top-normal size prostate with internal fiducial markers. Other: No pneumoperitoneum, ascites or focal fluid collection. Musculoskeletal: No aggressive appearing focal osseous lesions. Moderate thoracic spondylosis. Bilateral posterior spinal fusion hardware at L5-S1. IMPRESSION: 1. New widespread patchy ground-glass opacity and interlobular septal thickening (crazy paving pattern) throughout both lungs, most prominent in the right upper lobe. Differential considerations include atypical/viral pneumonia, drug toxicity and hypersensitivity pneumonitis. 2. Stable central medial left lower lobe pulmonary nodule. Dense patchy left perihilar and lower left lung consolidation with associated bronchiectasis, volume loss and distortion, similar to mildly increased, favor evolving postradiation change. 3. Stable small loculated basilar left pleural effusion. New trace dependent right pleural effusion. 4. Mild right hilar adenopathy is mildly increased, nonspecific. 5. Stable caudate lobe liver metastasis. No new or progressive metastatic disease in the abdomen or pelvis. 6. Aortic Atherosclerosis (ICD10-I70.0). Electronically Signed: By: Ilona Sorrel M.D. On: 07/31/2021 15:13   CT Abdomen Pelvis W Contrast  Addendum Date: 07/31/2021   ADDENDUM REPORT: 07/31/2021 15:31 ADDENDUM: Dilated 2.9 cm infrarenal abdominal aorta. Recommend follow-up ultrasound every 5 years. This recommendation follows ACR consensus guidelines: White Paper of the ACR Incidental Findings Committee II on Vascular Findings. J Am Coll Radiol 2013; 10:789-794. Electronically Signed   By: Ilona Sorrel M.D.   On: 07/31/2021 15:31   Result Date: 07/31/2021 CLINICAL DATA:  Stage IV non-small  cell left lung cancer diagnosed May 2022 status post palliative radiation therapy completed 03/23/2021 and chemoimmunotherapy. Patient admitted with pneumonia, dyspnea and lower extremity swelling. Restaging. EXAM: CT CHEST, ABDOMEN, AND PELVIS WITH CONTRAST TECHNIQUE: Multidetector CT imaging of the chest, abdomen and pelvis was performed following the standard protocol during bolus administration of intravenous contrast. CONTRAST:  78mL OMNIPAQUE IOHEXOL 350 MG/ML SOLN COMPARISON:  06/02/2021 CT chest, abdomen and pelvis. FINDINGS: CT CHEST FINDINGS Cardiovascular: Top-normal heart size. Stable small pericardial effusion/thickening. Three-vessel coronary atherosclerosis. Right internal jugular Port-A-Cath terminates in the lower third of the SVC. Atherosclerotic thoracic aorta with stable dilated 4.0 cm ascending thoracic aorta. Dilated main pulmonary artery (4.6 cm diameter). No central pulmonary emboli. Mediastinum/Nodes: No discrete thyroid nodules. Mild circumferential wall thickening in the lower thoracic esophagus, not definitely changed. No pathologically enlarged axillary or mediastinal lymph nodes (previously described mildly enlarged subcarinal lymph node is favored to represent the collapsed esophagus in retrospect). Mildly enlarged 1.3 cm right hilar node (series 2/image 31), increased mildly from 1.0 cm. Lungs/Pleura: No pneumothorax. Trace dependent right pleural effusion, new.  Small loculated basilar left pleural effusion, stable. Central medial left lower lobe 2.9 x 2.3 cm solid pulmonary nodule (series 2/image 34), previously 2.9 x 2.2 cm using similar measurement technique, not appreciably changed. New widespread patchy ground-glass opacity and interlobular septal thickening (crazy paving pattern) throughout both lungs, most prominent in right upper lobe. Dense patchy left perihilar and lower left lung consolidation with associated bronchiectasis, volume loss and distortion, similar to mildly increased, favor evolving postradiation change. No new significant pulmonary nodules. Musculoskeletal: No aggressive appearing focal osseous lesions. Moderate thoracic spondylosis. CT ABDOMEN PELVIS FINDINGS Hepatobiliary: Hypodense 2.6 x 1.5 cm caudate lobe liver mass (series 2/image 59), previously 2.6 x 1.7 cm using similar measurement technique, not appreciably changed. No additional liver lesions. Normal gallbladder with no radiopaque cholelithiasis. No biliary ductal dilatation. Pancreas: Normal, with no mass or duct dilation. Spleen: Normal size. No mass. Adrenals/Urinary Tract: Normal adrenals. No hydronephrosis. Simple 1.3 cm anterior interpolar left renal cyst. Normal bladder. Stomach/Bowel: Normal non-distended stomach. Normal caliber small bowel with no small bowel wall thickening. Normal appendix. Moderate sigmoid diverticulosis with no large bowel wall thickening or significant pericolonic fat stranding. Vascular/Lymphatic: Atherosclerotic abdominal aorta with dilated 2.9 cm infrarenal abdominal aorta. Patent portal, splenic, hepatic and renal veins. No pathologically enlarged lymph nodes in the abdomen or pelvis. Reproductive: Top-normal size prostate with internal fiducial markers. Other: No pneumoperitoneum, ascites or focal fluid collection. Musculoskeletal: No aggressive appearing focal osseous lesions. Moderate thoracic spondylosis. Bilateral posterior spinal fusion hardware at  L5-S1. IMPRESSION: 1. New widespread patchy ground-glass opacity and interlobular septal thickening (crazy paving pattern) throughout both lungs, most prominent in the right upper lobe. Differential considerations include atypical/viral pneumonia, drug toxicity and hypersensitivity pneumonitis. 2. Stable central medial left lower lobe pulmonary nodule. Dense patchy left perihilar and lower left lung consolidation with associated bronchiectasis, volume loss and distortion, similar to mildly increased, favor evolving postradiation change. 3. Stable small loculated basilar left pleural effusion. New trace dependent right pleural effusion. 4. Mild right hilar adenopathy is mildly increased, nonspecific. 5. Stable caudate lobe liver metastasis. No new or progressive metastatic disease in the abdomen or pelvis. 6. Aortic Atherosclerosis (ICD10-I70.0). Electronically Signed: By: Ilona Sorrel M.D. On: 07/31/2021 15:13   DG CHEST PORT 1 VIEW  Result Date: 08/03/2021 CLINICAL DATA:  Hypoxia EXAM: PORTABLE CHEST 1 VIEW COMPARISON:  Chest x-ray 08/01/2021 FINDINGS: Cardiomediastinal silhouette is unchanged. Cardiomegaly. Calcified plaques in  the aortic arch. Left-sided atrial loop recorder and right-sided central venous port are stable. Mildly increased extensive ground-glass alveolar opacities throughout the right lung since previous study. No significant change in extensive irregular consolidative opacities throughout the left lung. No pneumothorax. IMPRESSION: Extensive bilateral pneumonia as seen previously, with increasing infiltrates on the right since previous study. Electronically Signed   By: Ofilia Neas M.D.   On: 08/03/2021 09:09   DG CHEST PORT 1 VIEW  Result Date: 08/01/2021 CLINICAL DATA:  Shortness of breath. Non-small cell lung cancer on chemotherapy. EXAM: PORTABLE CHEST 1 VIEW COMPARISON:  Radiograph 2 days ago 07/30/2021.  CT yesterday. FINDINGS: Accessed right chest port in place. Planted  loop recorder in the left chest wall. Again seen volume loss in the left hemithorax. Pleuroparenchymal opacity in the lower half of the left hemithorax is similar to prior exam. Stable heart size and mediastinal contours. Patchy airspace disease in the right lung primarily in the upper lobe corresponds to ground-glass opacities on CT, with interval progression. Small right pleural effusion on CT is not well seen by radiograph. No pneumothorax or other significant interval change. IMPRESSION: 1. Patchy airspace disease in the right lung corresponds to ground-glass opacities on CT, with mild interval progression. 2. Otherwise unchanged exam. Chronic volume loss in the left hemithorax with pleuroparenchymal opacity in the left lower lung. Electronically Signed   By: Keith Rake M.D.   On: 08/01/2021 15:04   DG Abd 2 Views  Result Date: 07/30/2021 CLINICAL DATA:  Nausea, vomiting, stage IV lung cancer EXAM: ABDOMEN - 2 VIEW COMPARISON:  03/24/2021 PET-CT FINDINGS: Loop recorder overlies the left heart. Bilateral posterior spinal fusion hardware at L5-S1. Fiducial markers overlie the region of the prostate. Mild gaseous distention of the stomach. No disproportionately dilated small bowel loops or significant air-fluid levels. Mild-to-moderate colonic gas in stool. No evidence of pneumatosis or pneumoperitoneum. No radiopaque nephrolithiasis. Marked lumbar spondylosis. Nonspecific patchy left lung base opacity. IMPRESSION: Nonobstructive bowel gas pattern. Mild gaseous distention of the stomach. Electronically Signed   By: Ilona Sorrel M.D.   On: 07/30/2021 13:31   DG Swallowing Func-Speech Pathology  Result Date: 08/01/2021 Table formatting from the original result was not included. Objective Swallowing Evaluation: Type of Study: MBS-Modified Barium Swallow Study  Patient Details Name: KAMERYN TISDEL MRN: [id number] Date of Birth: Jun 03, 1946 Today's Date: 08/01/2021 Time: SLP Start Time (ACUTE ONLY): 1450 -SLP  Stop Time (ACUTE ONLY): 1515 SLP Time Calculation (min) (ACUTE ONLY): 25 min Past Medical History: Past Medical History: Diagnosis Date  Anemia   Anxiety   Arthritis   "back, right knee, hands, ankles, neck" (05/31/2016)  Carotid artery disease ([place]) 08/30/2016  Carotid US 11/19: R 1-39, L 40-59 // Carotid US 08/2019: R 1-39; L 40-59 // Carotid US 11/21: R 1-39; L 40-59>> repeat 1 year   Chronic lower back pain   Coronary atherosclerosis of native coronary artery   a. BMS to [facility] 2004 and 2007, otherwise mild nonobstructive disease. EF normal.  Diverticulitis   Dyslipidemia   Dyspnea   Essential hypertension, benign   Family history of breast cancer   Family history of lung cancer   Family history of prostate cancer   GERD (gastroesophageal reflux disease)   Lumbar radiculopathy, chronic 02/04/2015  Right L5  Obesity   OSA on CPAP   uses cpap  Paroxysmal atrial fibrillation ([place])   a. Discovered after stroke.  Pneumonia 01/1996  PONV (postoperative nausea and vomiting)   Prostate CA ([place])  Recurrent upper respiratory infection (URI)   Stroke ([place]) 07/2012  no deficits  Visit for monitoring Tikosyn therapy 09/18/2019 Past Surgical History: Past Surgical History: Procedure Laterality Date  ADENOIDECTOMY    ANTERIOR CERVICAL DECOMP/DISCECTOMY FUSION  07/2001; 10/2002  "C5-6; C6-7; redo"  BACK SURGERY    CARPAL TUNNEL RELEASE Left 10/2015  CHEST TUBE INSERTION Left 03/17/2021  Procedure: INSERTION PLEURAL DRAINAGE CATHETER;  Surgeon: Garner Nash, DO;  Location: [place];  Service: Pulmonary;  Laterality: Left;  Indwelling Tunneled Pleural catheter (PLEUREX)   COLONOSCOPY W/ POLYPECTOMY  02/2014  CORONARY ANGIOPLASTY WITH STENT PLACEMENT  05/2003; 12/2005  "mid RCA; mid RCA"  FINE NEEDLE ASPIRATION  02/26/2021  Procedure: FINE NEEDLE ASPIRATION (FNA) LINEAR;  Surgeon: Candee Furbish, MD;  Location[facility] END[facility]COPY;  Service: Pulmonary;;  HAND SURGERY  02/2019  LEFT HAND  implantable loop recorder placement  02/27/2020   [employer] Reveal T[place] model [id number]  implantable loop recorder implanted by Dr Rayann Heman for afib management and evaluation of presyncope  IR IMAGING GUIDED PORT INSERTION  04/16/2021  JOINT REPLACEMENT    KNEE ARTHROSCOPY Left 10/2005  KNEE ARTHROSCOPY W/ PARTIAL MEDIAL MENISCECTOMY Left 09/2005  LUMBAR LAMINECTOMY/DECOMPRESSION MICRODISCECTOMY  03/2005  "L4-5"  POSTERIOR LUMBAR FUSION  10/2003  L5-S1; "plates, screws"  SHOULDER ARTHROSCOPY Right 08/2011  Debridement of labrum, arthroscopic distal clavicle excision  SHOULDER OPEN ROTATOR CUFF REPAIR Left 07/2014  TEE WITHOUT CARDIOVERSION  07/07/2012  Procedure: TRANSESOPHAGEAL ECHOCARDIOGRAM (TEE);  Surgeon: Fay Records, MD;  Location[facility] END[facility]COPY;  Service: Cardiovascular;  Laterality: N/A;  THORACENTESIS N/A 02/25/2021  Procedure: Mathews Robinsons;  Surgeon: Juanito Doom, MD;  Location: [place];  Service: Cardiopulmonary;  Laterality: N/A;  TONSILLECTOMY AND ADENOIDECTOMY  ~ 1956  TOTAL KNEE ARTHROPLASTY Left 10/2006  TRIGGER FINGER RELEASE Left 10/2015  VIDEO BRONCHOSCOPY WITH ENDOBRONCHIAL ULTRASOUND Left 02/26/2021  Procedure: VIDEO BRONCHOSCOPY WITH ENDOBRONCHIAL ULTRASOUND;  Surgeon: Candee Furbish, MD;  Location: [facility] ENDOSCOPY;  Service: Pulmonary;  Laterality: Left;  cryoprobe too thanks! HPI: 75yo male admitted 07/28/21 with LLE swelling, fatigue and weakness. 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 CXR - unchanged LLL opacity. MRI pending. 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  Pt stated his any deficits from 2013 CVA resolved.  Pt also with h/o ACDF 0/2002 and "re do" 10/1999 - C5-C6, C6-C7. Swallow eval ordered. RN reports pt's oxygen needs have increased overnight.  Pt denies difficulty with swallowing currently. Endorses reflux/odynophagia during XRT for which he had taken a PPI and carafate *at least.  Subjective: pt awake in  chair Assessment / Plan / Recommendation CHL IP CLINICAL IMPRESSIONS 08/01/2021 Clinical Impression Pt was tested with thin, ultrathin (50% water with thin barium mixture), nectar, pudding and cracker.  Pt presents with minimal oral dysphagia characterized by lingual pumping with liquid allowing a few episodes of premature spillage of liquid into pharynx to vallecular space or pyriform sinus prior to swallow trigger.  Pharyngeal swallow is strong with adequate motility without retention.  Decreased timing of laryngeal closure inconsistently resulted in very trace laryngeal penetration of thin but no aspiration.  Due to pt's cervical hardware- chin tuck posture not tested. Upon esophageal sweep, esophagus was clear. After presenting thin barium swallow - pt appeared with retrograde propulsion of liquid to thoracic esophagus causing SLP to question if this could be consistent with dysmotility and contributing to aspiration risk. Defer to referring MD for indication of esophagram  during this hospital coarse.  Pt also did not cough during MBS and he was challenged to consume sequential boluses.  Recommend for energy conservation given dyspnea to continue dys3/thin diet.  Will follow up briefly for family education. Pt, spouse and son were educated during [facility]. SLP Visit Diagnosis Dysphagia, oral phase (R13.11) Attention and concentration deficit following -- Frontal lobe and executive function deficit following -- Impact on safety and function Mild aspiration risk   CHL IP TREATMENT RECOMMENDATION 08/01/2021 Treatment Recommendations Therapy as outlined in treatment plan below   Prognosis 08/01/2021 Prognosis for Safe Diet Advancement Good Barriers to Reach Goals -- Barriers/Prognosis Comment -- CHL IP DIET RECOMMENDATION 08/01/2021 SLP Diet Recommendations Dysphagia 3 (Mech soft) solids;Thin liquid Liquid Administration via Cup;Straw Medication Administration Other (Comment) Compensations Slow rate;Small sips/bites  Postural Changes Remain semi-upright after after feeds/meals (Comment);Seated upright at 90 degrees   CHL IP OTHER RECOMMENDATIONS 08/01/2021 Recommended Consults -- Oral Care Recommendations Oral care BID Other Recommendations --   CHL IP FOLLOW UP RECOMMENDATIONS 08/01/2021 Follow up Recommendations (No Data)   CHL IP FREQUENCY AND DURATION 08/01/2021 Speech Therapy Frequency (ACUTE ONLY) min 1 x/week Treatment Duration 1 week      CHL IP ORAL PHASE 08/01/2021 Oral Phase Impaired Oral - Pudding Teaspoon -- Oral - Pudding Cup -- Oral - Honey Teaspoon -- Oral - Honey Cup -- Oral - Nectar Teaspoon -- Oral - Nectar Cup -- Oral - Nectar Straw WFL Oral - Thin Teaspoon -- Oral - Thin Cup WFL;Premature spillage Oral - Thin Straw WFL;Premature spillage Oral - Puree Lingual pumping;Delayed oral transit Oral - Mech Soft WFL Oral - Regular -- Oral - Multi-Consistency -- Oral - Pill -- Oral Phase - Comment --  CHL IP PHARYNGEAL PHASE 08/01/2021 Pharyngeal Phase Impaired Pharyngeal- Pudding Teaspoon -- Pharyngeal -- Pharyngeal- Pudding Cup -- Pharyngeal -- Pharyngeal- Honey Teaspoon -- Pharyngeal -- Pharyngeal- Honey Cup -- Pharyngeal -- Pharyngeal- Nectar Teaspoon -- Pharyngeal -- Pharyngeal- Nectar Cup -- Pharyngeal -- Pharyngeal- Nectar Straw Penetration/Aspiration during swallow Pharyngeal Material enters airway, remains ABOVE vocal cords then ejected out Pharyngeal- Thin Teaspoon -- Pharyngeal -- Pharyngeal- Thin Cup Penetration/Aspiration during swallow Pharyngeal Material enters airway, remains ABOVE vocal cords then ejected out Pharyngeal- Thin Straw Penetration/Aspiration during swallow Pharyngeal Material enters airway, remains ABOVE vocal cords then ejected out Pharyngeal- Puree Delayed swallow initiation-vallecula;WFL Pharyngeal Material does not enter airway Pharyngeal- Mechanical Soft WFL;Delayed swallow initiation-vallecula Pharyngeal Material does not enter airway Pharyngeal- Regular -- Pharyngeal -- Pharyngeal-  Multi-consistency -- Pharyngeal -- Pharyngeal- Pill -- Pharyngeal -- Pharyngeal Comment --  CHL IP CERVICAL ESOPHAGEAL PHASE 08/01/2021 Cervical Esophageal Phase Impaired Pudding Teaspoon -- Pudding Cup -- Honey Teaspoon -- Honey Cup -- Nectar Teaspoon -- Nectar Cup -- Nectar Straw -- Thin Teaspoon -- Thin Cup -- Thin Straw -- Puree -- Mechanical Soft -- Regular -- Multi-consistency -- Pill -- Cervical Esophageal Comment appearance of inconsistent prominent CP, did not impair barium flow Kathleen Lime, MS [facility] SLP Acute Rehab Services Office [phone] Pager [phone] Macario Golds 08/01/2021, 5:44 PM              ECHOCARDIOGRAM COMPLETE  Result Date: 08/04/2021    ECHOCARDIOGRAM REPORT   Patient Name:   COLLIER BOHNET Date of Exam: 08/04/2021 Medical Rec #:  [id number]   Height:       73.5 in Accession #:    [id number]  Weight:       221.6 lb Date of Birth:  Feb 02, 1946  BSA:  2.259 m Patient Age:    81 years    BP:           125/75 mmHg Patient Gender: M           HR:           101 bpm. Exam Location:  Inpatient Procedure: 2D Echo, Cardiac Doppler and Color Doppler Indications:    [place] respiratory distress R06.03  History:        Patient has prior history of Echocardiogram examinations, most                 recent 02/25/2021. Stroke, Arrythmias:Atrial Fibrillation; Risk                 Factors:Dyslipidemia and Hypertension.  Sonographer:    Bernadene Person RDCS Referring Phys: [place]  1. Left ventricular ejection fraction, by estimation, is 65 to 70%. The left ventricle has normal function. The left ventricle has no regional wall motion abnormalities. There is mild left ventricular hypertrophy. Left ventricular diastolic parameters are indeterminate.  2. Right ventricular systolic function mild to moderately reduced. The right ventricular size is mildly enlarged. There is normal pulmonary artery systolic pressure.  3. Trivial mitral valve regurgitation.  4. The aortic valve is  tricuspid. Aortic valve regurgitation is not visualized. Mild aortic valve sclerosis is present, with no evidence of aortic valve stenosis.  5. Aortic dilatation noted. There is mild dilatation of the ascending aorta, measuring 40 mm.  6. The inferior vena cava is normal in size with greater than 50% respiratory variability, suggesting right atrial pressure of 3 mmHg. FINDINGS  Left Ventricle: Left ventricular ejection fraction, by estimation, is 65 to 70%. The left ventricle has normal function. The left ventricle has no regional wall motion abnormalities. The left ventricular internal cavity size was normal in size. There is  mild left ventricular hypertrophy. Left ventricular diastolic parameters are indeterminate. Right Ventricle: The right ventricular size is mildly enlarged. Right vetricular wall thickness was not assessed. Right ventricular systolic function mild to moderately reduced. There is normal pulmonary artery systolic pressure. The tricuspid regurgitant velocity is 2.24 m/s, and with an assumed right atrial pressure of 3 mmHg, the estimated right ventricular systolic pressure is 10.9 mmHg. Left Atrium: Left atrial size was normal in size. Right Atrium: Right atrial size was normal in size. Pericardium: Trivial pericardial effusion is present. Presence of pericardial fat pad. Mitral Valve: There is mild thickening of the mitral valve leaflet(s). Mild mitral annular calcification. Trivial mitral valve regurgitation. Tricuspid Valve: The tricuspid valve is normal in structure. Tricuspid valve regurgitation is trivial. Aortic Valve: The aortic valve is tricuspid. Aortic valve regurgitation is not visualized. Mild aortic valve sclerosis is present, with no evidence of aortic valve stenosis. Pulmonic Valve: The pulmonic valve was normal in structure. Pulmonic valve regurgitation is mild to moderate. Aorta: Aortic dilatation noted. There is mild dilatation of the ascending aorta, measuring 40 mm. Venous:  The inferior vena cava is normal in size with greater than 50% respiratory variability, suggesting right atrial pressure of 3 mmHg. IAS/Shunts: No atrial level shunt detected by color flow Doppler.  LEFT VENTRICLE PLAX 2D LVIDd:         4.20 cm   Diastology LVIDs:         2.60 cm   LV e' medial:    5.33 cm/s LV PW:         1.00 cm   LV E/e' medial:  10.7 LV IVS:  1.20 cm   LV e' lateral:   5.68 cm/s LVOT diam:     2.40 cm   LV E/e' lateral: 10.1 LV SV:         55 LV SV Index:   24 LVOT Area:     4.52 cm  RIGHT VENTRICLE RV S prime:     19.10 cm/s TAPSE (M-mode): 1.9 cm LEFT ATRIUM           Index        RIGHT ATRIUM           Index LA diam:      2.70 cm 1.20 cm/m   RA Area:     17.30 cm LA Vol (A2C): 34.1 ml 15.10 ml/m  RA Volume:   46.70 ml  20.68 ml/m LA Vol (A4C): 34.2 ml 15.14 ml/m  AORTIC VALVE LVOT Vmax:   81.80 cm/s LVOT Vmean:  54.400 cm/s LVOT VTI:    0.121 m  AORTA Ao Root diam: 3.40 cm Ao Asc diam:  4.00 cm MITRAL VALVE               TRICUSPID VALVE MV Area (PHT): 4.21 cm    TR Peak grad:   20.1 mmHg MV Decel Time: 180 msec    TR Vmax:        224.00 cm/s MV E velocity: 57.20 cm/s MV A velocity: 86.80 cm/s  SHUNTS MV E/A ratio:  0.66        Systemic VTI:  0.12 m                            Systemic Diam: 2.40 cm Dorris Carnes MD Electronically signed by Dorris Carnes MD Signature Date/Time: 08/04/2021/5:53:27 PM    Final    CUP PACEART REMOTE DEVICE CHECK  Result Date: 08/11/2021 ILR summary report received. Battery status OK. Normal device function. No new symptom,  brady, or pause episodes. No new AF episodes. Monthly summary reports and ROV/PRN 2 tachy events, longest duration 15sec, rate 176 EGM shows narrow, regular, tachy rhythm LR  VAS [place] LOWER EXTREMITY VENOUS (DVT)  Result Date: 07/30/2021  Lower Venous DVT Study Patient Name:  HARLAN ERVINE  Date of Exam:   07/30/2021 Medical Rec #: [id number]    Accession #:    [id number] Date of Birth: 06/07/1946   Patient Gender: M Patient Age:   21  years Exam Location:  [facility] Procedure:      VAS [place] LOWER EXTREMITY VENOUS (DVT) Referring Phys: Quillian Quince THOMPSON --------------------------------------------------------------------------------  Indications: LLE cellulitis.  Risk Factors: CA patient on chemotherapy. Limitations: Poor ultrasound/tissue interface. Comparison Study: No previous exams Performing Technologist: Jody Hill RVT, RDMS  Examination Guidelines: A complete evaluation includes B-mode imaging, spectral Doppler, color Doppler, and power Doppler as needed of all accessible portions of each vessel. Bilateral testing is considered an integral part of a complete examination. Limited examinations for reoccurring indications may be performed as noted. The reflux portion of the exam is performed with the patient in reverse Trendelenburg.  +---------+---------------+---------+-----------+----------+--------------+ RIGHT    CompressibilityPhasicitySpontaneityPropertiesThrombus Aging +---------+---------------+---------+-----------+----------+--------------+ CFV      Full           Yes      Yes                                 +---------+---------------+---------+-----------+----------+--------------+ SFJ  Full                                                        +---------+---------------+---------+-----------+----------+--------------+ FV Prox  Full           Yes      Yes                                 +---------+---------------+---------+-----------+----------+--------------+ FV Mid   Full           Yes      Yes                                 +---------+---------------+---------+-----------+----------+--------------+ FV DistalFull           Yes      Yes                                 +---------+---------------+---------+-----------+----------+--------------+ PFV      Full                                                         +---------+---------------+---------+-----------+----------+--------------+ POP      Full           Yes      Yes                                 +---------+---------------+---------+-----------+----------+--------------+ PTV      Full                                                        +---------+---------------+---------+-----------+----------+--------------+ PERO     Full                                                        +---------+---------------+---------+-----------+----------+--------------+   +---------+---------------+---------+-----------+----------+--------------+ LEFT     CompressibilityPhasicitySpontaneityPropertiesThrombus Aging +---------+---------------+---------+-----------+----------+--------------+ CFV      Full           Yes      Yes                                 +---------+---------------+---------+-----------+----------+--------------+ SFJ      Full                                                        +---------+---------------+---------+-----------+----------+--------------+ FV Prox  Full  Yes      Yes                                 +---------+---------------+---------+-----------+----------+--------------+ FV Mid   Full           Yes      Yes                                 +---------+---------------+---------+-----------+----------+--------------+ FV DistalFull           Yes      Yes                                 +---------+---------------+---------+-----------+----------+--------------+ PFV      Full                                                        +---------+---------------+---------+-----------+----------+--------------+ POP      Full           Yes      Yes                                 +---------+---------------+---------+-----------+----------+--------------+ PTV      Full                                                         +---------+---------------+---------+-----------+----------+--------------+ PERO     Full                                                        +---------+---------------+---------+-----------+----------+--------------+     Summary: BILATERAL: - No evidence of deep vein thrombosis seen in the lower extremities, bilaterally. - No evidence of superficial venous thrombosis in the lower extremities, bilaterally. -No evidence of popliteal cyst, bilaterally.   *See table(s) above for measurements and observations. Electronically signed by Orlie Pollen on 07/30/2021 at 6:44:08 PM.    Final     Labs:  Basic Metabolic Panel: No results for input(s): NA, K, CL, CO2, GLUCOSE, BUN, CREATININE, CALCIUM, MG, PHOS in the last 168 hours.  CBC: No results for input(s): WBC, NEUTROABS, HGB, HCT, MCV, PLT in the last 168 hours.  CBG: No results for input(s): GLUCAP in the last 168 hours.  Family history.  Mother with hypertension Father with CAD.  Brother with prostate cancer and Parkinson's disease.  Paternal aunt with lung cancer.  Denies any colon cancer pancreatic cancer or esophageal cancer  Brief HPI:   [name] is a 75 y.o. 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 [name], prostate cancer, Parkinson's disease on Sinemet, atrial fibrillation, OSA, CAD, hyperlipidemia, obesity with BMI 27.60, quit smoking 18 years ago.  Chronic low back pain with radiculopathy.  Patient recently completed course of Levaquin for pneumonia.  Per chart review lives with spouse.  Two-level home bed and bath upstairs.  Patient was using a cane prior to admission had been receiving home therapies.  Presented 07/28/2021 with left lower extremity swelling and generalized weakness with fatigue over the past week.  Some erythema and swelling left ankle.  Patient did develop intractable nausea and vomiting.  Chest x-ray showed unchanged left lower lobe opacity.  CT of  the chest abdomen pelvis showed new widespread patchy groundglass opacities and interlobar septal thickening throughout both lungs most prominent in the right upper lobe.  Stable central medial left lower lobe pulmonary nodule.  Stable small loculated basilar left pleural effusion.  Stable caudate lobe liver metastasis no new or progressive metastatic disease in the abdomen or pelvis.  Echocardiogram with ejection fraction of 65 to 70% no wall motion abnormalities.  Admission chemistry sodium 126 glucose 110 hemoglobin 9.0 lactic acid 0.7 blood cultures no growth to date urinalysis negative nitrite.  Placed on IV antibiotics for left lower extremity cellulitis.  Critical care did follow-up for acute respiratory failure with hypoxia likely secondary to aspiration pneumonia versus drug-induced pneumonitis.  Noted sedimentation rate greater than 140.  He was placed on steroids with taper.  Anemia of chronic disease likely secondary to chemotherapy no overt bleeding was transfused 2 units packed red blood cells 08/02/2021.  Therapy evaluations completed due to patient decreased functional mobility was admitted for a comprehensive rehab program.   Hospital Course: ALDRIN ENGELHARD was admitted to rehab 08/12/2021 for inpatient therapies to consist of PT, ST and OT at least three hours five days a week. Past admission physiatrist, therapy team and rehab RN have worked together to provide customized collaborative inpatient rehab.  Pertain to patient's debility secondary to left lower extremity cellulitis complicated by acute respiratory failure as well as stage IV non-small cell lung cancer.  Patient participating with therapies.  He continued on chronic Xarelto for history of atrial fibrillation cardiac rate controlled.  Pain management with the use of Topamax scheduled twice daily as well as tramadol.  Mood stabilization with Xanax at nighttime as needed with emotional support provided.  Blood pressure heart rate controlled  he continued on Tikosyn as well as Toprol and would need outpatient follow-up.  In regards to patient's acute respiratory failure hypoxia related to aspiration pneumonia with history of nonsmall cell lung cancer followed by Dr. [name] and arrangements made for BiPAP oxygen nebulizers on discharge.  Lipitor ongoing for hyperlipidemia.  He did continue on Sinemet for history of Parkinson's disease.  Acute on chronic anemia no overt bleeding episodes and latest hemoglobin 10.1.   Blood pressures were monitored on TID basis and controlled     Rehab course: During patient's stay in rehab weekly team conferences were held to monitor patient's progress, set goals and discuss barriers to discharge. At admission, patient required minimal assist stand pivot transfers max assist to feet rolling walker  Physical exam.  Blood pressure 124/77 pulse 91 temperature 97.8 respirations 18 oxygen saturations 96% room air Constitutional.  No acute distress HEENT Head.  Normocephalic and atraumatic Eyes.  Pupils round and reactive to light no discharge without nystagmus Neck.  Supple nontender no JVD without thyromegaly Cardiac regular rate rhythm any extra sounds or murmur heard Abdomen.  Soft nontender positive bowel sounds without rebound Respiratory decreased inspiratory effort at the bases without Rales Skin.  Warm and dry Neurologic.  Alert oriented follows commands.  5/5 strength  throughout  He/She  has had improvement in activity tolerance, balance, postural control as well as ability to compensate for deficits. He/She has had improvement in functional use RUE/LUE  and RLE/LLE as well as improvement in awareness.  Supine to sit with bed features minimal assist sit to stand rolling walker contact-guard gait training in crowded environment contact-guard amatory transfers to sit on bedside commode over toilet contact-guard and moderate cues.  He did need rest breaks working with energy conservation.  Working  with wife on ADLs stand pivot transfers toileting skill performance.  Supine to sit min mod assist for trunk elevation for toilet transfers.  Full family teaching completed plan discharged to home       Disposition: Discharged home    Diet: Regular  Special Instructions: No driving smoking or alcohol  Oxygen therapy as directed  Medications at discharge 1.  Tylenol as needed 2.  Acidophilus 1 capsule daily 3.  Albuterol nebulizer every 4 hours as needed 4.  Xanax 0.5 mg nightly as needed 5.  Brovona nebulizer 15 mcg twice daily 6.  Lipitor 40 mg p.o. daily 7.  Tessalon 200 mg 3 times daily 8.  Pulmicort nebulizer 0.5 mg twice daily 9.  Sinemet 25-101 capsule 3 times daily 10.  Dapsone 100 mg p.o. daily 11.  Tikosyn 500 mg twice daily 12.  Fenofibrate 160 mg p.o. daily 13.  Flonase 2 sprays each nostril daily 14.  Folic acid 1 mg daily 15.  Mucinex 1200 mg p.o. twice daily 16.  Atrovent nebulizer 0.5 mg twice daily 17.  Claritin 10 mg daily as needed 18.  Magnesium oxide 400 mg p.o. daily 19.  Toprol-XL 25 mg p.o. daily 20.  Multivitamin daily 21.  Protonix 40 mg p.o. daily 22.  Prednisone 80 mg p.o. daily and taper as directed 23.  Xarelto 20 mg p.o. daily 24.  Flomax 0.4 mg daily 25.  Topamax 100 mg p.o. twice daily 26.  Tramadol 50 mg every 6 hours as needed pain 27.  Trazodone 50 mg p.o. bedtime 28.  Nitroglycerin as needed 29.  Klor-Con 10 mEq every day  30-35 minutes were spent completing discharge summary and discharge planning     Follow-up Information     Lovorn, Jinny Blossom, MD Follow up.   Specialty: Physical Medicine and Rehabilitation Why: No formal follow-up needed Contact information: [address] [place] [address] [phone]         Curt Bears, MD Follow up.   Specialty: Oncology Why: Call for appointment Contact information: [place] [address] [phone]                  Signed: Cathlyn Parsons 08/28/2021, 5:27 AM

## 2021-08-24 NOTE — Progress Notes (Signed)
Carelink Summary Report / Loop Recorder 

## 2021-08-24 NOTE — Progress Notes (Signed)
Patient ID: Corey [name], male   DOB: 08-21-1946, 75 y.o.   MRN: [id number]  SATURATION QUALIFICATIONS: (This note is used to comply with regulatory documentation for home oxygen)  Patient Saturations on Room Air at Rest = 86%  Patient Saturations on Room Air while Ambulating = 80%  Patient Saturations on 3-4 Liters of oxygen while Ambulating = 92%  Please briefly explain why patient needs home oxygen: Pt has stage four lung cancer and is requiring continuous O2 for his sats to stay at least 90%. He is also getting breathing treatments three times per day.

## 2021-08-24 NOTE — Progress Notes (Signed)
PA and MD made aware of pt current condition. Updated on current vitals. No new orders at this time.  Sheela Stack, LPN

## 2021-08-24 NOTE — Progress Notes (Signed)
Physical Therapy Session Note  Patient Details  Name: Corey [name] MRN: [id number] Date of Birth: 1946-04-21  Today's Date: 08/24/2021 PT Individual Time: 0930-1005, 2297-9892 PT Individual Time Calculation (min): 35 min, 72 min  Short Term Goals: Week 1:  PT Short Term Goal 1 (Week 1): Pt will ambulate x 50 ft with LRAD PT Short Term Goal 1 - Progress (Week 1): Met PT Short Term Goal 2 (Week 1): Pt will initiate stair training PT Short Term Goal 2 - Progress (Week 1): Met PT Short Term Goal 3 (Week 1): Pt wil tolerate x 30 min of activity with rest breaks PT Short Term Goal 3 - Progress (Week 1): Met Week 2:  PT Short Term Goal 1 (Week 2): =LTGs d/t ELOS  Skilled Therapeutic Interventions/Progress Updates:    Session 1: Pt in bed on arrival, no c/o pain. Pt reports his O2 levels have been unstable this AM and he is frustrated with his situation. Therapist provided psychosocial support and discussed d/c planning at length. Pt also complained of constipation, therapist provided colon stim massage for holistic pt care and educated on expected results. Pt then agreeable to bed level exercise. Superset of banded ankle pumps, banded heels slides, and bicep curl x 12 each. Pt then politely declined further activity. Pt remained in bed on 3L O2 and was left with all needs in reach and alarm active. Pt missed 40 min of scheduled time d/t unstable vital signs.  Session 2: pt received in bed and agreeable to therapy. No complaint of pain. Session focused on building upright tolerance for mobility at d/c. O2 on 3L on arrival. After initial stand and during all other activity, O2 dropped to 87-88% but returned to >90 within 30-45 sec each time on both 3L and 4L. Maintained on 4L during mobility. Pt hovered at ~91% throughout session. Therapist monitored vitals throughout session and provided extended rest breaks PRN.   Bed mobility with CGA fading to close supervision throughout session. Pt completed 2  bouts of standing with RW, x 20 sec and x 30 sec with CGA to stand from elevated bed. Pt then performed CGA stedy transfer to [facility]. Extended time on [facility] with no void, continued to monitor  vitals. After sitting on commode and stedy transfer back to bed, O2 noted to drop to 85%, returned to 90 within 60 sec.   Pt then performed bed level exercise for conditioning and LE strength: -banded bridges, 3 x 8 -double banded BKFO 3 x 10 -supine banded single leg "squats," 3 x 12 BIL  Pt remained in bed at end of session and was left with all needs in reach and alarm active.   Therapy Documentation Precautions:  Precautions Precautions: Fall Precaution Comments: monitor sats  and BP Restrictions Weight Bearing Restrictions: No General: PT Amount of Missed Time (min): 40 Minutes PT Missed Treatment Reason: Patient ill (Comment) (unstable vital signs)    Therapy/Group: Individual Therapy  [name] 08/24/2021, 10:14 AM

## 2021-08-24 NOTE — Progress Notes (Signed)
RT placed patient on BIPAP with 3L O2 bled into circuit. Patient tolerating BIPAP well at this time. RT will monitor as needed.

## 2021-08-24 NOTE — Progress Notes (Signed)
Patient ID: [name], male   DOB: [birth year], 75 y.o.   MRN: [id number] [place] with wife via telephone to answer questions regarding discharge. She is requesting a hospital bed now and have made referral to [employer] for hospital bed, bedside commode, nebulizer machine and home O2. [facility] can get out to home tomorrow due to discharge is Wed. Also asking for CNA list will leave in pt's room. Continue to work on discharge needs.

## 2021-08-24 NOTE — Progress Notes (Signed)
Occupational Therapy Session Note  Patient Details  Name: Corey [name] MRN: [id number] Date of Birth: 1946-08-15  Today's Date: 08/24/2021 OT Individual Time: 5520-8022 OT Individual Time Calculation (min): 54 min    Short Term Goals: Week 1:  OT Short Term Goal 1 (Week 1): Pt will complete transfer to [facility] with LRAD MIN A OT Short Term Goal 1 - Progress (Week 1): Met OT Short Term Goal 2 (Week 1): Pt will don/doff pants min A OT Short Term Goal 2 - Progress (Week 1): Met OT Short Term Goal 3 (Week 1): Pt will perform grooming standing at sink with min A OT Short Term Goal 3 - Progress (Week 1): Progressing toward goal OT Short Term Goal 4 (Week 1): Pt will complete toileting tasks with LRAD and min A OT Short Term Goal 4 - Progress (Week 1): Met Week 2:  OT Short Term Goal 1 (Week 2): STGs = LTGs 2/2 ELOS at Supervision   Skilled Therapeutic Interventions/Progress Updates:    Pt greeted at time of session semireclined in bed finishing up with nursing staff, hand off to OT. Initial part of session spent discussing with pt regarding recent need for more assist, CLOF, DC home, etc with pt stating he may want a few more days. Recommended speak with MD and therapy will do so at conference. Politely declined bathing tasks but wanting to get dressed, returned with paper scrubs. Pt with condom cath on, confirmed with nursing OK to remove. Donned brief bed level with bridging for time conservation. Supine > sit Min A for trunk and cues for form, sitting EOB checked BP and 110/74 with HR 133, O2 at 92%. Pt stating needing to toilet, but agreeable to sit EOB until dizziness improved. Stand pivot bed > BSC with Min A and RW, on BSC checked vitals and 99/70 with HR varying between 53 and 145. Rechecked BP and 83/22 with HR of 135, likely false reading. Pt needing total A for hygiene this session, unable to weight shift enough to perform. Donned TEDS total A for time and attempted sit > stand from [employer] but  unable, feeling shaky and feeling like "going to fall." 2nd helper present at this time and assisted with safety with Stedy transfer BSC > bed and rechecked BP at 106/71 with HR 180. All vitals relayed to nursing. Pt semireclined in bed resting with O2 at 3L, alarm on call bell in reach and nursing aware of status.   Therapy Documentation Precautions:  Precautions Precautions: Fall Precaution Comments: monitor sats  and BP Restrictions Weight Bearing Restrictions: No    Therapy/Group: Individual Therapy  Viona Gilmore 08/24/2021, 7:09 AM

## 2021-08-25 ENCOUNTER — Inpatient Hospital Stay: Payer: Medicare PPO

## 2021-08-25 ENCOUNTER — Inpatient Hospital Stay: Payer: Medicare PPO | Admitting: Internal Medicine

## 2021-08-25 MED ORDER — BENZONATATE 200 MG PO CAPS: 200.0000 mg | ORAL_CAPSULE | Freq: Three times a day (TID) | ORAL | 0 refills | Status: DC

## 2021-08-25 MED ORDER — SENNOSIDES-DOCUSATE SODIUM 8.6-50 MG PO TABS: 1.0000 | ORAL_TABLET | Freq: Two times a day (BID) | ORAL | Status: DC

## 2021-08-25 MED ORDER — RIVAROXABAN 20 MG PO TABS: 20.0000 mg | ORAL_TABLET | Freq: Every day | ORAL | 2 refills | Status: AC

## 2021-08-25 MED ORDER — HYDROCOD POLST-CPM POLST ER 10-8 MG/5ML PO SUER: 5.0000 mL | Freq: Two times a day (BID) | ORAL | 0 refills | Status: DC | PRN

## 2021-08-25 MED ORDER — CHLORHEXIDINE GLUCONATE CLOTH 2 % EX PADS
6.0000 | MEDICATED_PAD | Freq: Two times a day (BID) | CUTANEOUS | Status: DC
  Administered 2021-08-25 – 2021-08-29 (×8): 6 via TOPICAL

## 2021-08-25 MED ORDER — TOPIRAMATE 50 MG PO TABS: 100.0000 mg | ORAL_TABLET | Freq: Two times a day (BID) | ORAL | 3 refills | Status: DC

## 2021-08-25 MED ORDER — DOFETILIDE 500 MCG PO CAPS: 500.0000 ug | ORAL_CAPSULE | Freq: Two times a day (BID) | ORAL | 3 refills | Status: DC

## 2021-08-25 MED ORDER — LORATADINE 10 MG PO TABS: 10.0000 mg | ORAL_TABLET | Freq: Every day | ORAL | 0 refills | Status: AC | PRN

## 2021-08-25 MED ORDER — FLUTICASONE PROPIONATE 50 MCG/ACT NA SUSP: 2.0000 | Freq: Every day | NASAL | 5 refills | Status: DC

## 2021-08-25 MED ORDER — TRAMADOL HCL 50 MG PO TABS: 50.0000 mg | ORAL_TABLET | Freq: Four times a day (QID) | ORAL | 0 refills | Status: DC | PRN

## 2021-08-25 MED ORDER — ATORVASTATIN CALCIUM 40 MG PO TABS: 40.0000 mg | ORAL_TABLET | Freq: Every day | ORAL | 3 refills | Status: DC

## 2021-08-25 MED ORDER — DAPSONE 100 MG PO TABS: 100.0000 mg | ORAL_TABLET | Freq: Every day | ORAL | 0 refills | Status: DC

## 2021-08-25 MED ORDER — FOLIC ACID 1 MG PO TABS: 1.0000 mg | ORAL_TABLET | Freq: Every day | ORAL | 1 refills | Status: DC

## 2021-08-25 MED ORDER — PREDNISONE 10 MG PO TABS: ORAL_TABLET | ORAL | 0 refills | Status: DC

## 2021-08-25 MED ORDER — POTASSIUM CHLORIDE CRYS ER 10 MEQ PO TBCR: 10.0000 meq | EXTENDED_RELEASE_TABLET | Freq: Every day | ORAL | 0 refills | Status: DC

## 2021-08-25 MED ORDER — HEPARIN SOD (PORK) LOCK FLUSH 100 UNIT/ML IV SOLN
500.0000 [IU] | INTRAVENOUS | Status: AC | PRN
  Administered 2021-08-25: 500 [IU]
  Filled 2021-08-25: qty 5

## 2021-08-25 MED ORDER — FENOFIBRATE 160 MG PO TABS: ORAL_TABLET | ORAL | 3 refills | Status: DC

## 2021-08-25 MED ORDER — ALPRAZOLAM 0.5 MG PO TABS: 0.5000 mg | ORAL_TABLET | Freq: Every evening | ORAL | 0 refills | Status: DC | PRN

## 2021-08-25 MED ORDER — POLYETHYLENE GLYCOL 3350 17 G PO PACK: 17.0000 g | PACK | Freq: Every day | ORAL | 0 refills | Status: DC

## 2021-08-25 MED ORDER — OMEPRAZOLE 20 MG PO CPDR: 20.0000 mg | DELAYED_RELEASE_CAPSULE | Freq: Every day | ORAL | 5 refills | Status: DC

## 2021-08-25 MED ORDER — MAGNESIUM OXIDE -MG SUPPLEMENT 400 (240 MG) MG PO TABS: 1.0000 | ORAL_TABLET | Freq: Every day | ORAL | 2 refills | Status: DC

## 2021-08-25 MED ORDER — TRAZODONE HCL 50 MG PO TABS: 50.0000 mg | ORAL_TABLET | Freq: Every day | ORAL | 0 refills | Status: DC

## 2021-08-25 MED ORDER — ACETAMINOPHEN 325 MG PO TABS: 650.0000 mg | ORAL_TABLET | Freq: Four times a day (QID) | ORAL | Status: DC | PRN

## 2021-08-25 MED ORDER — NITROGLYCERIN 0.4 MG SL SUBL: 0.4000 mg | SUBLINGUAL_TABLET | SUBLINGUAL | 5 refills | Status: AC | PRN

## 2021-08-25 MED ORDER — METOPROLOL SUCCINATE ER 25 MG PO TB24: 25.0000 mg | ORAL_TABLET | Freq: Every day | ORAL | 1 refills | Status: AC

## 2021-08-25 MED ORDER — TAMSULOSIN HCL 0.4 MG PO CAPS: 0.4000 mg | ORAL_CAPSULE | Freq: Every day | ORAL | 0 refills | Status: DC

## 2021-08-25 NOTE — Progress Notes (Signed)
Placed patient on home CPAP machine with oxygen set at 4lpm.

## 2021-08-25 NOTE — Patient Care Conference (Signed)
Inpatient RehabilitationTeam Conference and Plan of Care Update Date: 08/25/2021   Time: 11:29 AM    Patient Name: Corey [name]      Medical Record Number: [id number]  Date of Birth: Feb 08, 1946 Sex: Male         Room/Bed: 4M01C/4M01C-01 Payor Info: Payor: HU[employer] MEDICARE / Plan: [employer] MEDICARE CHOICE PPO / Product Type: *No Product type* /    Admit Date/Time:  08/12/2021  4:35 PM  Primary Diagnosis:  [facility] Problems: Principal Problem:   Debility Active Problems:   Shortness of breath   Non-small cell carcinoma of left lung, stage 4 (HCC)   Adjustment disorder with mixed anxiety and depressed mood   Streptococcal sore throat   Acute on chronic anemia   Supplemental oxygen dependent   Notalgia    Expected Discharge Date: Expected Discharge Date: 08/28/21  Team Members Present: Physician leading conference: Dr. Courtney Heys Social Worker Present: Ovidio Kin, LCSW Nurse Present: Dorthula Nettles, RN PT Present: Ailene Rud, PT OT Present: Lillia Corporal, OT PPS Coordinator present : Gunnar Fusi, SLP     Current Status/Progress Goal Weekly Team Focus  Bowel/Bladder   pt is cont of B/B. LBM 11/21  Pt remain cont of B/B.  toilet pt as needed   Swallow/Nutrition/ Hydration             ADL's   last week CGA/Min overall progressing to Supervision. As of 11/21, increased weakness and difficulty with transfers, increase SOB. Min/Mod LB bathe/dress, very weak/decreased endurance, confusion?  Supervision overall  endurance, functional transfers, OOB tolerance, DC planning, ADL retraining   Mobility   CGA-min A transfers. Severely limited by O2 sats, unable to progress at this time  supervision  OOB tolerance, d/c planning   Communication             Safety/Cognition/ Behavioral Observations            Pain   Pt denies pain  Pt remain pain free  Assess for pain qshift and provide relief as needed   Skin   Pt skin is clean and free of injury or breakdown   Pt skin to remain free if injury or breakdown  Skin assessement and skin care qshift     Discharge Planning:  Had a decline since this weekend, and is not doing as well. Feels partly due to COVID booster and weaning of steroid. Wife has been in for education-concerned regarding functional decline   Team Discussion: He will decline due to cancer. Will be 3 weeks before starting chemo. Will need O2 at home. BP running low, HR running high. Complains of headache 4/10. Has had a decline from last week.  Patient on target to meet rehab goals: Currently contact guard with PT today. Supervision goals overall.  *See Care Plan and progress notes for long and short-term goals.   Revisions to Treatment Plan:  Monitor labs.  Teaching Needs: Family education, medication/pain management, safety awareness, transfer training, etc.  Current Barriers to Discharge: Decreased caregiver support, Home enviroment access/layout, Weight, Weight bearing restrictions, Medication compliance, Pending chemo/radiation, and New oxygen  Possible Resolutions to Barriers: Family education Follow up  PT/OT Recommended DME Follow up with souse     Medical Summary Current Status: On 4L O2- HA's- conitnent- skin good- has port- hasn't mentioned HA's to doctor; declined in function- needed steady off toilet;  Barriers to Discharge: Home enviroment access/layout;Decreased family/caregiver support;Medical stability;Weight bearing restrictions;Pending chemo/radiation;Other (comments)  Barriers to Discharge Comments: can't get O2  delivered until tomorrow-stage IV cancer- will not get better- will further decline due to progression of disease- Possible Resolutions to [employer] Focus: pt is in denial- needs family training on lower level of function before d/c - better this AM- CGA this AM- will d/c 12/25-after more family training.   Continued Need for Acute Rehabilitation Level of Care: The patient requires daily medical  management by a physician with specialized training in physical medicine and rehabilitation for the following reasons: Direction of a multidisciplinary physical rehabilitation program to maximize functional independence : Yes Medical management of patient stability for increased activity during participation in an intensive rehabilitation regime.: Yes Analysis of laboratory values and/or radiology reports with any subsequent need for medication adjustment and/or medical intervention. : Yes   I attest that I was present, lead the team conference, and concur with the assessment and plan of the team.   Cristi Loron 08/25/2021, 3:53 PM

## 2021-08-25 NOTE — Progress Notes (Signed)
Occupational Therapy Session Note  Patient Details  Name: Corey [name] MRN: [id number] Date of Birth: 1946-09-30  Today's Date: 08/25/2021 OT Individual Time: 1430-1553 OT Individual Time Calculation (min): 83 min    Short Term Goals: Week 1:  OT Short Term Goal 1 (Week 1): Pt will complete transfer to [facility] with LRAD MIN A OT Short Term Goal 1 - Progress (Week 1): Met OT Short Term Goal 2 (Week 1): Pt will don/doff pants min A OT Short Term Goal 2 - Progress (Week 1): Met OT Short Term Goal 3 (Week 1): Pt will perform grooming standing at sink with min A OT Short Term Goal 3 - Progress (Week 1): Progressing toward goal OT Short Term Goal 4 (Week 1): Pt will complete toileting tasks with LRAD and min A OT Short Term Goal 4 - Progress (Week 1): Met Week 2:  OT Short Term Goal 1 (Week 2): STGs = LTGs 2/2 ELOS at Supervision   Skilled Therapeutic Interventions/Progress Updates:    Pt greeted at time of session semireclined in bed resting, agreeable to OT session and stating he felt better today > yesterday. Pt wanting to complete ADL session, no reports of pain just fatigue throughout session. Supine > sit Supervision and stand pivot bed > wheelchair CGA/Min with RW. Self propel to sink and set up for sink level bathing tasks. Performed UB/LB bathing with Min A for LB only, assist to thoroughly wash buttocks and educated on using LHS for BLE past knee level. Needing to toilet at this time, self propel > bathroom with assist to get over bathroom edge and stand pivot w/c > BSC over toilet with CGA/Min. O2 checked at 91% after transfer. Extended time needed for toileting and to have BM, Min A for toileting tasks for clothing management 2/2 fatigue. Stand pivot BSC > wheelchair > bed with CGA throughout and RW. Pt remained on 3L throughout session and monitored O2 throughout, dropping to around 86% with strenuous activity but improving to 91% + with O2 and PLB. Pt needed extended time for all tasks 2/2  rest breaks and fatigue. Discussion regarding future needs and assist from wife pending fluctuating energy levels. Pt in bed resting alarm on call bell in reach on wall O2.   Therapy Documentation Precautions:  Precautions Precautions: Fall Precaution Comments: monitor sats  and BP Restrictions Weight Bearing Restrictions: No     Therapy/Group: Individual Therapy  Viona Gilmore 08/25/2021, 12:56 PM

## 2021-08-25 NOTE — Progress Notes (Signed)
Inpatient Rehabilitation Discharge Medication Review by a Pharmacist  A complete drug regimen review was completed for this patient to identify any potential clinically significant medication issues.  High Risk Drug Classes Is patient taking? Indication by Medication  Antipsychotic No   Anticoagulant Yes Xarelto for Afib  Antibiotic No   Opioid Yes Tramadol for pain  Antiplatelet No   Hypoglycemics/insulin No   Vasoactive Medication Yes Tikosyn, Metoprolol for Afib  Chemotherapy No   Other Yes Sinemet for Parkinson's Flomax for BPH Dapsone, nebs for ILD / High dose prednisone for cancer, ARF Lipitor, fenofibrate for HLD Topamax for mood     Type of Medication Issue Identified Description of Issue Recommendation(s)  Drug Interaction(s) (clinically significant)     Duplicate Therapy     Allergy     No Medication Administration End Date     Incorrect Dose     Additional Drug Therapy Needed     Significant med changes from prior encounter (inform family/care partners about these prior to discharge).    Other       Clinically significant medication issues were identified that warrant physician communication and completion of prescribed/recommended actions by midnight of the next day:  No  Pharmacist comments: Steroid being tapered  Time spent performing this drug regimen review (minutes):  20 minutes   Tad Moore 08/24/2021 8:07 AM

## 2021-08-25 NOTE — Progress Notes (Signed)
Deleted note

## 2021-08-25 NOTE — Progress Notes (Signed)
Physical Therapy Session Note  Patient Details  Name: Corey [name] MRN: [id number] Date of Birth: [birth year]  Today's Date: 08/25/2021 PT Individual Time: 1630-1700 PT Individual Time Calculation (min): 30 min   Short Term Goals: Week 2:  PT Short Term Goal 1 (Week 2): =LTGs d/t ELOS  Skilled Therapeutic Interventions/Progress Updates:    Pt received seated in bed, agreeable to PT session. No complaints of pain. Supine to sit with Supervision. Seated BLE strengthening therex: marches, LAQ, hip add squeeze x 10 reps each. Sit to stand with CGA to RW. Pt able to take sidesteps L/R 3 x 5 ft with RW and CGA for balance. Pt initially on 3L O2 via [place] at start of session, SpO2 88-89%, increased to 4L O2 for activity. SpO2 drops to 88% with activity but recovers to 90% (+) with seated rest break and pursed lip breathing. Pt left seated EOB to eat his dinner, brother present.  Therapy Documentation Precautions:  Precautions Precautions: Fall Precaution Comments: monitor sats  and BP Restrictions Weight Bearing Restrictions: No     Therapy/Group: Individual Therapy   Excell Seltzer, PT, DPT, CSRS  08/25/2021, 5:13 PM

## 2021-08-25 NOTE — Progress Notes (Signed)
PROGRESS NOTE   Subjective/Complaints:  Pt reports today is wife's "long day" so won't be home to get O2- spoke with SW- will try to get delivered in AM before d/c- however if not, will   Using condom catheter- at nights- but plans on going home without it.   Asked for tylenol for hand pain.  Per SW, decreased function likely due to decrease in steroids, etc.   ROS:   Pt denies SOB, abd pain, CP, N/V/C/D, and vision changes     Objective:   No results found. No results for input(s): WBC, HGB, HCT, PLT in the last 72 hours.  No results for input(s): NA, K, CL, CO2, GLUCOSE, BUN, CREATININE, CALCIUM in the last 72 hours.   Intake/Output Summary (Last 24 hours) at 08/25/2021 0855 Last data filed at 08/25/2021 0600 Gross per 24 hour  Intake 420 ml  Output 550 ml  Net -130 ml        Physical Exam: Vital Signs Blood pressure 114/75, pulse 93, temperature 98.1 F (36.7 C), resp. rate 20, height 6' 1.5" (1.867 m), weight 96 kg, SpO2 97 %.     General: awake, alert, appropriate, sitting up in bed; O2 4L via [place]; NAD HENT: conjugate gaze; oropharynx moist CV: regular rate; no JVD Pulmonary: coarse BS; decreased at bases - no W/R/R GI: soft, NT, ND, (+)BS Psychiatric: appropriate- but irritable Neurological: alert  Musc: No edema in extremities.  No tenderness in extremities. Neuro: Alert Motor: Strength 4+/5 proximally in Ue's and LE's and 5-/5 distally, stable.  Assessment/Plan: 1. Functional deficits which require 3+ hours per day of interdisciplinary therapy in a comprehensive inpatient rehab setting. Physiatrist is providing close team supervision and 24 hour management of active medical problems listed below. Physiatrist and rehab team continue to assess barriers to discharge/monitor patient progress toward functional and medical goals  Care Tool:  Bathing    Body parts bathed by patient: Right arm,  Left arm, Chest, Abdomen, Front perineal area, Face, Buttocks, Right upper leg, Left upper leg   Body parts bathed by helper: Buttocks Body parts n/a: Right lower leg, Left lower leg   Bathing assist Assist Level: Minimal Assistance - Patient > 75%     Upper Body Dressing/Undressing Upper body dressing   What is the patient wearing?: Pull over shirt    Upper body assist Assist Level: Set up assist    Lower Body Dressing/Undressing Lower body dressing      What is the patient wearing?: Incontinence brief, Pants     Lower body assist Assist for lower body dressing: Minimal Assistance - Patient > 75%     Toileting Toileting    Toileting assist Assist for toileting: Contact Guard/Touching assist     Transfers Chair/bed transfer  Transfers assist     Chair/bed transfer assist level: Contact Guard/Touching assist     Locomotion Ambulation   Ambulation assist   Ambulation activity did not occur: Safety/medical concerns  Assist level: Contact Guard/Touching assist Assistive device: Walker-rolling Max distance: 18   Walk 10 feet activity   Assist  Walk 10 feet activity did not occur: Safety/medical concerns  Assist level: Contact Guard/Touching assist Assistive device:  Walker-rolling   Walk 50 feet activity   Assist Walk 50 feet with 2 turns activity did not occur: Safety/medical concerns         Walk 150 feet activity   Assist Walk 150 feet activity did not occur: Safety/medical concerns         Walk 10 feet on uneven surface  activity   Assist Walk 10 feet on uneven surfaces activity did not occur: Safety/medical concerns         Wheelchair     Assist Is the patient using a wheelchair?: Yes Type of Wheelchair: Manual Wheelchair activity did not occur: Safety/medical concerns  Wheelchair assist level: Supervision/Verbal cueing Max wheelchair distance: 70    Wheelchair 50 feet with 2 turns activity    Assist     Wheelchair 50 feet with 2 turns activity did not occur: Safety/medical concerns   Assist Level: Supervision/Verbal cueing   Wheelchair 150 feet activity     Assist  Wheelchair 150 feet activity did not occur: Safety/medical concerns   Assist Level: Moderate Assistance - Patient 50 - 74%   Blood pressure 114/75, pulse 93, temperature 98.1 F (36.7 C), resp. rate 20, height 6' 1.5" (1.867 m), weight 96 kg, SpO2 97 %.  Medical Problem List and Plan: 1.  Debility secondary to left lower extremity cellulitis complicated by acute respiratory failure/pneumonia/multi medical  Con't PT and OT/CIR 2.  Antithrombotics: -DVT/anticoagulation:  Pharmaceutical: Xarelto.  Vascular ultrasound lower extremities negative             -antiplatelet therapy: N/A. 3. Pain: Continue Topamax 100 mg twice daily, Tramadol as needed  11/11- having back pain from previous fusions in neck/back - con't tramadol prn- also will ask therapy for wedge for propping.   11/22- needs tylenol - con't regimen 4. Anxiety: Continue Xanax 0.5 mg nightly as needed             -antipsychotic agents: N/A 5. Neuropsych: This patient is capable of making decisions on his own behalf. 6. Skin/Wound Care: Routine skin checks 7. Fluids/Electrolytes/Nutrition: Routine in and outs 8.  Atrial fibrillation.  Cardiac rate controlled.  Continue Xarelto.  Tikosyn 500 mg twice daily, Toprol-XL 25 mg daily  11/16- con't Tikosyn and Xarelto- con't regimen  Rate controlled on 11/19 9.  Acute respiratory failure hypoxia related to aspiration pneumonia.  Oncology recommended high-dose steroids x2 weeks until 08/18/2021 followed by gradual taper of prednisone as per Dr. [name].  Continue BiPAP/oxygen therapy  Advance to regular thins  Albuterol prn nebs- ordered- reminded pt to ask for when he needs. 11/21- pt to go home on O2 and Nebs- wife needs ot be trained how to do nebs.    11/22- asked her to take nebs this AM-since feels SOB- con't  regimen otherwise 10.  Stage IV non-small cell lung cancer.  Palliative chemotherapy per oncology Dr. [name].  11/16- will not get chemo until prednisone down to 10 mg or less- con't prednisone gradual taper per Dr [name]  Supplemental oxygen dependent  11/21- will not start chemo until at 10 mg prednisone- also wrote to taper to 60 mg daily as of tomorrow  11/22- having decline in function- likely due to reduction in steroids, and progression of disease.  11.  Hyperlipidemia.  Lipitor/fenofibrate 12.  Parkinson's disease.  Sinemet 13.  Acute on chronic anemia.  Follow-up CBC  Hemoglobin 10.1 on 11/13  Continue to monitor 14. Sore/dry throat  Biotene; and Throat spray, Chloraseptic ordered  11/21- asked nursing to  get for pt.    LOS: 13 days A FACE TO FACE EVALUATION WAS PERFORMED  Seville Downs 08/25/2021, 8:55 AM

## 2021-08-25 NOTE — Progress Notes (Signed)
Physical Therapy Session Note  Patient Details  Name: Corey [name] MRN: [id number] Date of Birth: March 07, 1946  Today's Date: 08/25/2021 PT Individual Time: 0915-1030 PT Individual Time Calculation (min): 75 min   Short Term Goals: Week 1:  PT Short Term Goal 1 (Week 1): Pt will ambulate x 50 ft with LRAD PT Short Term Goal 1 - Progress (Week 1): Met PT Short Term Goal 2 (Week 1): Pt will initiate stair training PT Short Term Goal 2 - Progress (Week 1): Met PT Short Term Goal 3 (Week 1): Pt wil tolerate x 30 min of activity with rest breaks PT Short Term Goal 3 - Progress (Week 1): Met Week 2:  PT Short Term Goal 1 (Week 2): =LTGs d/t ELOS  Skilled Therapeutic Interventions/Progress Updates:    pt received in bed and agreeable to therapy. No complaint of pain. O2 at rest hovering about 93%. O2 dropped to 87% after sitting up initially and car transfer, but rose within 60 sec and returned to 90-93%. Discussed energy conservation techniques throughout. Maintained on 3L O2 throughout.   Bed mobility mod I with bed rail. Pt required several minutes to recover while sitting EOB, discussing d/c plan. Stand pivot transfer to w/c with RW and CGA. Pt transported to therapy gym for time management and energy conservation. CGA car transfer with RW. Pt required several minutes to rest before getting out of car. At this time, pt request to use BSC. Transferred to [facility] in same manner. CGA for balance with clothing management, tot A for hygiene. Pt sat back to lid of BSC to rest before performing Stand pivot transfer to EOB with CGA. Pt returned to supine mod I and was left with all needs in reach and alarm active.   Therapy Documentation Precautions:  Precautions Precautions: Fall Precaution Comments: monitor sats  and BP Restrictions Weight Bearing Restrictions: No    Therapy/Group: Individual Therapy  [name] 08/25/2021, 12:16 PM

## 2021-08-25 NOTE — Progress Notes (Signed)
Physical Therapy Discharge Summary  Patient Details  Name: Corey [name] MRN: [id number] Date of Birth: Apr 20, 1946  Today's Date: 08/28/2021   Patient has met 5 of 8 long term goals due to improved activity tolerance, improved balance, ability to compensate for deficits, and improved awareness.  Patient to discharge at a wheelchair level [place].   Patient's care partner is independent to provide the necessary physical assistance at discharge.  Reasons goals not met: Pt's medical status prevented full participation at times and continues to limit mobility. Pt will use transport w/c for home entry with family members providing assist.   Recommendation:  Patient will benefit from ongoing skilled PT services in home health setting to continue to advance safe functional mobility, address ongoing impairments in Strength, balance, endurance, and minimize fall risk.  Equipment: No equipment provided  Reasons for discharge: treatment goals met and discharge from hospital  Patient/family agrees with progress made and goals achieved: Yes  PT Discharge Precautions/Restrictions Precautions Precautions: Fall Precaution Comments: monitor sats  and BP Restrictions Weight Bearing Restrictions: No Vital Signs Therapy Vitals Pulse Rate: 93 BP: 114/75 Oxygen Therapy SpO2: 97 % O2 Device: Nasal Cannula O2 Flow Rate (L/min): 3 L/min Pain Pain Assessment Pain Scale: 0-10 Pain Score: 3  Pain Location: Hand Pain Orientation: Right;Left Pain Intervention(s): Medication (See eMAR) Pain Interference Pain Interference Pain Effect on Sleep: 1. Rarely or not at all Pain Interference with Therapy Activities: 1. Rarely or not at all Pain Interference with Day-to-Day Activities: 1. Rarely or not at all Vision/Perception  Perception Perception: Within Functional Limits Praxis Praxis: Intact  Cognition Overall Cognitive Status: Within Functional Limits for tasks assessed Arousal/Alertness:  Awake/alert Orientation Level: Oriented X4 Year: 2022 Month: November Day of Week: Correct Attention: Focused Focused Attention: Appears intact Memory: Impaired (believes deficit to be age related) Memory Impairment: Decreased short term memory Decreased Short Term Memory: Verbal basic Awareness: Appears intact Problem Solving: Appears intact Safety/Judgment: Appears intact Comments: overall pt with good awareness of functional status and has strategies for cognitive STM Sensation Sensation Light Touch: Appears Intact Hot/Cold: Appears Intact Proprioception: Appears Intact Stereognosis: Appears Intact Additional Comments: sensation over dorsum of L foot has returned Coordination Gross Motor Movements are Fluid and Coordinated: No Coordination and Movement Description: mildy uncoordinated d/t generalized weakness Motor  Motor Motor: Other (comment) Motor - Skilled Clinical Observations: Generalized weakness  Mobility Bed Mobility Bed Mobility: Supine to Sit Supine to Sit: Supervision/Verbal cueing Transfers Transfers: Sit to Stand;Stand Pivot Transfers;Stand to Sit Sit to Stand: Supervision/Verbal cueing Stand to Sit: Supervision/Verbal cueing Stand Pivot Transfers: Supervision/Verbal cueing Stand Pivot Transfer Details: Verbal cues for sequencing;Verbal cues for precautions/safety;Verbal cues for safe use of DME/AE;Verbal cues for technique Stand Pivot Transfer Details (indicate cue type and reason): extra time Transfer (Assistive device): Rolling walker Locomotion  Gait Ambulation: Yes Gait Assistance: Supervision/Verbal cueing Gait Distance (Feet): 50 Feet Assistive device: Rolling walker Gait Gait: No Gait velocity: decreased Stairs / Additional Locomotion Stairs: Yes Stairs Assistance: Moderate Assistance - Patient 50 - 74% Stair Management Technique: Two rails Number of Stairs: 2 Height of Stairs: 6 Pick up small object from the floor (from standing  position) activity did not occur: Safety/medical concerns Product manager Mobility: Yes Wheelchair Assistance: Independent with Camera operator: Both upper extremities Wheelchair Parts Management: Needs assistance Distance: 300 prior to having difficulty maintaining O2  Trunk/Postural Assessment  Cervical Assessment Cervical Assessment: Within Functional Limits Thoracic Assessment Thoracic Assessment: Within Functional Limits Lumbar Assessment Lumbar  Assessment: Within Functional Limits Postural Control Postural Control: Deficits on evaluation (generalized debility)  Balance Balance Balance Assessed: Yes Static Sitting Balance Static Sitting - Balance Support: Feet supported Static Sitting - Level of Assistance: 5: Stand by assistance Dynamic Sitting Balance Dynamic Sitting - Balance Support: Feet supported Dynamic Sitting - Level of Assistance: 5: Stand by assistance Static Standing Balance Static Standing - Balance Support: During functional activity;Bilateral upper extremity supported Static Standing - Level of Assistance: 4: Min assist Dynamic Standing Balance Dynamic Standing - Balance Support: During functional activity;Bilateral upper extremity supported Dynamic Standing - Level of Assistance: 4: Min assist Extremity Assessment      RLE Assessment RLE Assessment: Exceptions to [facility] General Strength Comments: HF 4-, grossly 4+ elsewhere LLE Assessment LLE Assessment: Exceptions to [facility] General Strength Comments: Grossly 5/5, except HF=4/5, poor endurance    Deneise Getty C Stillman Buenger 08/25/2021, 12:14 PM

## 2021-08-25 NOTE — Progress Notes (Signed)
Patient ID: Corey [name], male   DOB: [birth year], 75 y.o.   MRN: [id number]  Met with pt and left message for wife to discuss team conference the need to extend due to medical issues-HR and new date for 11/25. Pt voiced he feels better with this and knows his wife will she is concerned he be as medically stable before going home so he does not have to come right back to the hospital. Equipment suppose to go out tomorrow but wife has not heard from [place] yet. Will need to have equipment-home O2 there before pt can discharge home. Will work on discharge, have reached out to [employer] regarding the oxygen and hospital bed.

## 2021-08-26 DIAGNOSIS — C3412 Malignant neoplasm of upper lobe, left bronchus or lung: Secondary | ICD-10-CM | POA: Insufficient documentation

## 2021-08-26 MED ORDER — ALBUTEROL SULFATE (2.5 MG/3ML) 0.083% IN NEBU: 2.5000 mg | INHALATION_SOLUTION | Freq: Every morning | RESPIRATORY_TRACT | Status: DC

## 2021-08-26 NOTE — Progress Notes (Incomplete)
Occupational Therapy Discharge Summary  Patient Details  Name: Corey [name] MRN: [id number] Date of Birth: [birth year]    Patient has met {NUMBERS 0-12:18577} of {NUMBERS 0-12:18577} long term goals due to {due to:3041651}.  Patient to discharge at overall {LOA:3049010} level.  Patient's care partner {care partner:3041650} to provide the necessary {assistance:3041652} assistance at discharge.    Reasons goals not met: ***  Recommendation:  Patient will benefit from ongoing skilled OT services in {setting:3041680} to continue to advance functional skills in the area of {ADL/iADL:3041649}.  Equipment: {equipment:3041657}  Reasons for discharge: {Reason for discharge:3049018}  Patient/family agrees with progress made and goals achieved: {Pt/Family agree with progress/goals:3049020}  OT Discharge Precautions/Restrictions    General   Vital Signs Therapy Vitals Temp: 98.4 F (36.9 C) Pulse Rate: 89 Resp: 14 BP: 119/76 Patient Position (if appropriate): Lying Oxygen Therapy SpO2: 97 % O2 Device: Room Air Pain   ADL ADL Equipment Provided: Other (comment) (Pt has used AE previously with success) Eating: Set up Where Assessed-Eating: Bed level Grooming: Setup Where Assessed-Grooming: Edge of bed Upper Body Bathing: Setup Where Assessed-Upper Body Bathing: Edge of bed Lower Body Bathing: Moderate assistance Where Assessed-Lower Body Bathing: Edge of bed Upper Body Dressing: Setup Where Assessed-Upper Body Dressing: Edge of bed Lower Body Dressing: Moderate assistance Where Assessed-Lower Body Dressing: Edge of bed Toileting: Moderate assistance Where Assessed-Toileting: Bedside Commode Toilet Transfer: Minimal verbal cueing, Moderate assistance Toilet Transfer Method: Stand pivot Toilet Transfer Equipment: Bedside commode Tub/Shower Transfer: Moderate assistance Tub/Shower Transfer Method: Stand pivot Tub/Shower Equipment: Radio broadcast assistant, Physiological scientist: Moderate assistance Social research officer, government Method: Radiographer, therapeutic: Radio broadcast assistant ADL Comments:  (Extra time needed for BADLs d/t fatigue, O2 sats and rest breaks) Vision   Perception    Praxis   Cognition   Sensation   Motor    Mobility     Trunk/Postural Assessment     Balance   Extremity/Trunk Assessment       Viona Gilmore 08/26/2021, 7:23 AM

## 2021-08-26 NOTE — Progress Notes (Signed)
Inpatient Rehabilitation Care Coordinator Discharge Note DC SAT 11/26  Patient Details  Name: Corey [name] MRN: [id number] Date of Birth: Sep 03, 1946   Discharge location: HOME WITH WIFE AND OTHER FAMILY MEMBERS CAN DO 24/7  Length of Stay:  17 days  Discharge activity level: [place]  Home/community participation: ACTIVE  Patient response KG:URKYHC Literacy - How often do you need to have someone help you when you read instructions, pamphlets, or other written material from your doctor or pharmacy?: Sometimes  Patient response WC:BJSEGB Isolation - How often do you feel lonely or isolated from those around you?: Never  Services provided included: MD, RD, PT, OT, RN, CM, TR, Pharmacy, SW  Financial Services:  Financial Services Utilized: Medicare    Choices offered to/list presented to: PT AND WIFE  Follow-up services arranged:  Home Health, DME, Patient/Family request agency HH/DME [place]: [facility]-PT,OT, RN    DME : [facility], HOME O2 3 IN 1 AND NEBULIZERS HH/DME Requested Agency: [place]  Patient response to transportation need: Is the patient able to respond to transportation needs?: Yes In the past 12 months, has lack of transportation kept you from medical appointments or from getting medications?: No In the past 12 months, has lack of transportation kept you from meetings, work, or from getting things needed for daily living?: No    Comments (or additional information): WIFE HAS [place]. WILL HAVE FAMILY MEMBERS WITH HIM OR HIRE ASSIST. HAS PRIVATE DUTY LIST  Patient/Family verbalized understanding of follow-up arrangements:  Yes  Individual responsible for coordination of the follow-up plan: RENEE-WIFE [phone]  Confirmed correct DME delivered: Elease Hashimoto 08/26/2021    Vinnie Bobst, [place]

## 2021-08-26 NOTE — Progress Notes (Signed)
PROGRESS NOTE   Subjective/Complaints:   We moved pt's discharge to Friday- pt and wife happy about this- stuff to be delivered today- and family traiing today and Friday AM.   Pt feeling "tight" this AM- feels like needs nebs/albuterol- will have nursing get for pt.  O2 sats 93% on 4L-  ROS:   Pt denies SOB more than normal, abd pain, CP, N/V/C/D, and vision changes    Objective:   No results found. No results for input(s): WBC, HGB, HCT, PLT in the last 72 hours.  No results for input(s): NA, K, CL, CO2, GLUCOSE, BUN, CREATININE, CALCIUM in the last 72 hours.   Intake/Output Summary (Last 24 hours) at 08/26/2021 0851 Last data filed at 08/26/2021 0820 Gross per 24 hour  Intake 700 ml  Output 900 ml  Net -200 ml        Physical Exam: Vital Signs Blood pressure 119/76, pulse 89, temperature 98.4 F (36.9 C), resp. rate 14, height 6' 1.5" (1.867 m), weight 96.5 kg, SpO2 97 %.      General: awake, alert, appropriate, sitting EOB; appears happy; NAD HENT: conjugate gaze; oropharynx moist CV: regular rate; no JVD Pulmonary: tight sounding- less air movement, but adequate- no increase rate of breathing; no W/R/R GI: soft, NT, ND, (+)BS Psychiatric: appropriate- not irritable this AM Neurological: Ox3 Musc: No edema in extremities.  No tenderness in extremities. Neuro: Alert Motor: Strength 4+/5 proximally in Ue's and LE's and 5-/5 distally, stable.  Assessment/Plan: 1. Functional deficits which require 3+ hours per day of interdisciplinary therapy in a comprehensive inpatient rehab setting. Physiatrist is providing close team supervision and 24 hour management of active medical problems listed below. Physiatrist and rehab team continue to assess barriers to discharge/monitor patient progress toward functional and medical goals  Care Tool:  Bathing    Body parts bathed by patient: Right arm, Left arm,  Chest, Abdomen, Front perineal area, Face, Buttocks, Right upper leg, Left upper leg   Body parts bathed by helper: Buttocks Body parts n/a: Right lower leg, Left lower leg   Bathing assist Assist Level: Minimal Assistance - Patient > 75%     Upper Body Dressing/Undressing Upper body dressing   What is the patient wearing?: Pull over shirt    Upper body assist Assist Level: Set up assist    Lower Body Dressing/Undressing Lower body dressing      What is the patient wearing?: Incontinence brief, Pants     Lower body assist Assist for lower body dressing: Moderate Assistance - Patient 50 - 74%     Toileting Toileting    Toileting assist Assist for toileting: Minimal Assistance - Patient > 75%     Transfers Chair/bed transfer  Transfers assist     Chair/bed transfer assist level: Contact Guard/Touching assist     Locomotion Ambulation   Ambulation assist   Ambulation activity did not occur: Safety/medical concerns  Assist level: Contact Guard/Touching assist Assistive device: Walker-rolling Max distance: 18   Walk 10 feet activity   Assist  Walk 10 feet activity did not occur: Safety/medical concerns  Assist level: Contact Guard/Touching assist Assistive device: Walker-rolling   Walk 50 feet activity  Assist Walk 50 feet with 2 turns activity did not occur: Safety/medical concerns         Walk 150 feet activity   Assist Walk 150 feet activity did not occur: Safety/medical concerns         Walk 10 feet on uneven surface  activity   Assist Walk 10 feet on uneven surfaces activity did not occur: Safety/medical concerns         Wheelchair     Assist Is the patient using a wheelchair?: Yes Type of Wheelchair: Manual Wheelchair activity did not occur: Safety/medical concerns  Wheelchair assist level: Supervision/Verbal cueing Max wheelchair distance: 70    Wheelchair 50 feet with 2 turns activity    Assist     Wheelchair 50 feet with 2 turns activity did not occur: Safety/medical concerns   Assist Level: Supervision/Verbal cueing   Wheelchair 150 feet activity     Assist  Wheelchair 150 feet activity did not occur: Safety/medical concerns   Assist Level: Moderate Assistance - Patient 50 - 74%   Blood pressure 119/76, pulse 89, temperature 98.4 F (36.9 C), resp. rate 14, height 6' 1.5" (1.867 m), weight 96.5 kg, SpO2 97 %.  Medical Problem List and Plan: 1.  Debility secondary to left lower extremity cellulitis complicated by acute respiratory failure/pneumonia/multi medical  Con't CIR_ move d/c to Friday 11/25 2.  Antithrombotics: -DVT/anticoagulation:  Pharmaceutical: Xarelto.  Vascular ultrasound lower extremities negative             -antiplatelet therapy: N/A. 3. Pain: Continue Topamax 100 mg twice daily, Tramadol as needed  11/11- having back pain from previous fusions in neck/back - con't tramadol prn- also will ask therapy for wedge for propping.   11/22- needs tylenol - con't regimen  11/23- pain controlled this AM- con't regimen 4. Anxiety: Continue Xanax 0.5 mg nightly as needed             -antipsychotic agents: N/A 5. Neuropsych: This patient is capable of making decisions on his own behalf. 6. Skin/Wound Care: Routine skin checks 7. Fluids/Electrolytes/Nutrition: Routine in and outs 8.  Atrial fibrillation.  Cardiac rate controlled.  Continue Xarelto.  Tikosyn 500 mg twice daily, Toprol-XL 25 mg daily  11/16- con't Tikosyn and Xarelto- con't regimen  Rate controlled on 11/19 9.  Acute respiratory failure hypoxia related to aspiration pneumonia.  Oncology recommended high-dose steroids x2 weeks until 08/18/2021 followed by gradual taper of prednisone as per Dr. [name].  Continue BiPAP/oxygen therapy  [facility] to regular thins  Albuterol prn nebs- ordered- reminded pt to ask for when he needs. 11/21- pt to go home on O2 and Nebs- wife needs ot be trained how to do  nebs.    11/22- asked her to take nebs this AM-since feels SOB- con't regimen otherwise  11/23- asked to train wife on nebs machine- also will schedule Albuterol nebs at 6am daily.  10.  Stage IV non-small cell lung cancer.  Palliative chemotherapy per oncology Dr. [name].  11/16- will not get chemo until prednisone down to 10 mg or less- con't prednisone gradual taper per Dr [name]  Supplemental oxygen dependent  11/21- will not start chemo until at 10 mg prednisone- also wrote to taper to 60 mg daily as of tomorrow  11/22- having decline in function- likely due to reduction in steroids, and progression of disease.  11.  Hyperlipidemia.  Lipitor/fenofibrate 12.  Parkinson's disease.  Sinemet 13.  Acute on chronic anemia.  Follow-up CBC  Hemoglobin 10.1 on  11/13  Continue to monitor 14. Sore/dry throat  Biotene; and Throat spray, Chloraseptic ordered  11/21- asked nursing to get for pt.    LOS: 14 days A FACE TO FACE EVALUATION WAS PERFORMED  Corey [name] 08/26/2021, 8:51 AM

## 2021-08-26 NOTE — Progress Notes (Signed)
RT placed patient on home BIPAP with 4L O2 bled into circuit. Pt tolerating settings well at this time. RT will monitor as needed.

## 2021-08-26 NOTE — Progress Notes (Addendum)
Physical Therapy Session Note  Patient Details  Name: Corey [name] MRN: [id number] Date of Birth: [birth year]  Today's Date: 08/26/2021 PT Individual Time: 1105-1205 and 1315-1415 PT Individual Time Calculation (min): 60 min and 60 min  Short Term Goals: Week 2:  PT Short Term Goal 1 (Week 2): =LTGs d/t ELOS  Skilled Therapeutic Interventions/Progress Updates: Pt presented in w/c agreeable to therapy. Pt denies pain throughout session, states felt a dizzy just prior to PTA arrival. Requested check of SpO2 96% on 3L supplemental O2. Pt agreeable to participate in activities for endurance. Pt ambulated 43f and 347fwith w/c follow. Pt's SpO2 >90% with each bout with HR 109 and 105 respectively after ambulation. Pt then transported to day room and participated in Cybex Kinetron with first bout at 30cm/sec x 30 cycles, second bout 10cm/sec x 30 cycles with SpO2 >90% and HR 95-100 after each bout. Pt then instructed to perform last bout to "fatigue" with pt able to perform 108 cycles at 20cm/sec. SpO2 94% HR 101. During rest breaks discussed with pt continued progress with endurance and is any concerns regarding mobility upon d/c. Pt states feels comfortable with current functional level at d/c, particularly with extension of d/c date. Pt transported back to room for energy conservation and performed ambulatory transfer to recliner with supervision. Pt left in recliner at end of session with call bell within reach, seat alarm on, and needs met.   Tx2: Pt presented in recliner with brother present eating lunch. PTA agreeable to return after eating. PTA returned 15 min later with pt agreeable to therapy. Session focused on functional mobility with brother present. Pt ambulated from recliner 4012fith RW and 3 L supplemental O2 at CGA fading to supervision level. SpO2 after ambulation 91% HR 113. After extended rest participated in another bout of ambulation at supervision level [id number] SpO2 91% HR 116. Pt  then transported to car and participated in car transfer at overall supervision level with increased time and extended recovery time. During recovery discussed with brother d/c goal at supervision and asked if any questions regarind mobility with brother concerned about step for entry but pt stating has transport chair and will use that for entry. Pt's brother is familiar with chair with has used in past. Pt becoming notably fatigued towards end of session therefore transported back to room. Pt requesting to use bathroom prior to returning to bed therefore performed step pivot w/c to elevated toilet(+BM). Pt requesting increase in supplemental O2 with some dyspnea noted but resolved after toileting. Pt performed toilet transfers with close S and performed peri-care with CGA. Performed step pivot transfer back to w/c and transported to EOB. Performed stand pivot to bed close supervision and sit to supine with supervision and use of bed features. Pt left in bed at end of session with bed alarm on, call bell within reach and needs met.      Therapy Documentation Precautions:  Precautions Precautions: Fall Precaution Comments: monitor sats  and BP Restrictions Weight Bearing Restrictions: No General: PT Amount of Missed Time (min): 15 Minutes PT Missed Treatment Reason: Other (Comment) (lunch) Vital Signs:   Pain: Pain Assessment Pain Scale: 0-10 Pain Score: 3  Pain Type: Acute pain Pain Location: Hand   Therapy/Group: Individual Therapy  Chenika Nevils Mykell Genao, PTA  08/26/2021, 12:20 PM

## 2021-08-26 NOTE — Progress Notes (Signed)
Occupational Therapy Session Note  Patient Details  Name: Corey [name] MRN: [id number] Date of Birth: 12-22-1945  Today's Date: 08/26/2021 OT Individual Time: 1000-1058 OT Individual Time Calculation (min): 58 min    Short Term Goals: Week 1:  OT Short Term Goal 1 (Week 1): Pt will complete transfer to [facility] with LRAD MIN A OT Short Term Goal 1 - Progress (Week 1): Met OT Short Term Goal 2 (Week 1): Pt will don/doff pants min A OT Short Term Goal 2 - Progress (Week 1): Met OT Short Term Goal 3 (Week 1): Pt will perform grooming standing at sink with min A OT Short Term Goal 3 - Progress (Week 1): Progressing toward goal OT Short Term Goal 4 (Week 1): Pt will complete toileting tasks with LRAD and min A OT Short Term Goal 4 - Progress (Week 1): Met Week 2:  OT Short Term Goal 1 (Week 2): STGs = LTGs 2/2 ELOS at Supervision   Skilled Therapeutic Interventions/Progress Updates:    Pt greeted at time of session semireclined in bed agreeable to OT session, no pain. Pt stating wife not coming today 2/2 needing to be home for hospital bed delivery. Spoke with wife at beginning of session regarding CLOF, decline after steroid taper, and planned family ed for Friday (in the morning before leaving at 9am). Spoke with scheduling and relayed to SW. Pt in agreement with this plan. Removed condom cath at beginning of session as pt only wears at night. Pt performing bed mobility Supervision to sit EOB and stand pivot to wheelchair min/CGA with RW. Self propel to sink with BUE propulsion and Mod I for oral hygiene, set up for washing face. Extended time needed for tasks 2/2 fatigue. Pt able to gauge throughout session for needing rest breaks, encouraged PLB, etc in prep for directing care at home. Set up in wheelchair for PT session, alarm on call bell in reach.   Therapy Documentation Precautions:  Precautions Precautions: Fall Precaution Comments: monitor sats  and BP Restrictions Weight Bearing  Restrictions: No     Therapy/Group: Individual Therapy  Viona Gilmore 08/26/2021, 7:22 AM

## 2021-08-26 NOTE — Progress Notes (Addendum)
Patient ID: Corey [name], male   DOB: 03/25/1946, 75 y.o.   MRN: [id number] [place] with wife who is waiting for hospital bed and home O2 to be delivered. Wife to be here Friday for some education. Question is who will show them how to use nebulizer and O2 at home. Have reached out to [employer] to find this information out.  1:31 PM [employer] to show wife who to use O2 when deliver it, have contacted wife to make her aware of this and to ask them to show her and feel comfortable with before letting them leave.

## 2021-08-27 MED ORDER — IPRATROPIUM-ALBUTEROL 0.5-2.5 (3) MG/3ML IN SOLN
3.0000 mL | Freq: Two times a day (BID) | RESPIRATORY_TRACT | Status: DC
  Administered 2021-08-27 – 2021-08-29 (×4): 3 mL via RESPIRATORY_TRACT
  Filled 2021-08-27 (×4): qty 3

## 2021-08-27 NOTE — Progress Notes (Signed)
PROGRESS NOTE   Subjective/Complaints:   Pt reports hasn't gotten am nebs yet- is 7am- was asleep til a few minutes prior.  Nebs help his breathing a lot.   Wife got delivery of equipment yesterday- didn't do family training- will complete Friday AM.  ROS:   Pt denies SOB, abd pain, CP, N/V/C/D, and vision changes   Objective:   No results found. No results for input(s): WBC, HGB, HCT, PLT in the last 72 hours.  No results for input(s): NA, K, CL, CO2, GLUCOSE, BUN, CREATININE, CALCIUM in the last 72 hours.   Intake/Output Summary (Last 24 hours) at 08/27/2021 0858 Last data filed at 08/27/2021 0720 Gross per 24 hour  Intake 720 ml  Output 1700 ml  Net -980 ml        Physical Exam: Vital Signs Blood pressure 119/78, pulse 91, temperature 97.9 F (36.6 C), temperature source Oral, resp. rate 17, height 6' 1.5" (1.867 m), weight 97.1 kg, SpO2 96 %.      General: awake, alert, appropriate, sitting up in bed eating; NAD HENT: conjugate gaze; oropharynx moist CV: regular rate; no JVD Pulmonary: less tight; adequate air movement- decreased at bases B/L  GI: soft, NT, ND, (+)BS Psychiatric: appropriate- less irritable- actually brighter Neurological: Ox3  Musc: No edema in extremities.  No tenderness in extremities. Neuro: Alert Motor: Strength 4+/5 proximally in Ue's and LE's and 5-/5 distally, stable.  Assessment/Plan: 1. Functional deficits which require 3+ hours per day of interdisciplinary therapy in a comprehensive inpatient rehab setting. Physiatrist is providing close team supervision and 24 hour management of active medical problems listed below. Physiatrist and rehab team continue to assess barriers to discharge/monitor patient progress toward functional and medical goals  Care Tool:  Bathing    Body parts bathed by patient: Right arm, Left arm, Chest, Abdomen, Front perineal area, Face,  Buttocks, Right upper leg, Left upper leg   Body parts bathed by helper: Buttocks Body parts n/a: Right lower leg, Left lower leg   Bathing assist Assist Level: Minimal Assistance - Patient > 75%     Upper Body Dressing/Undressing Upper body dressing   What is the patient wearing?: Pull over shirt    Upper body assist Assist Level: Set up assist    Lower Body Dressing/Undressing Lower body dressing      What is the patient wearing?: Incontinence brief, Pants     Lower body assist Assist for lower body dressing: Moderate Assistance - Patient 50 - 74%     Toileting Toileting    Toileting assist Assist for toileting: Minimal Assistance - Patient > 75%     Transfers Chair/bed transfer  Transfers assist     Chair/bed transfer assist level: Contact Guard/Touching assist     Locomotion Ambulation   Ambulation assist   Ambulation activity did not occur: Safety/medical concerns  Assist level: Contact Guard/Touching assist Assistive device: Walker-rolling Max distance: 18   Walk 10 feet activity   Assist  Walk 10 feet activity did not occur: Safety/medical concerns  Assist level: Contact Guard/Touching assist Assistive device: Walker-rolling   Walk 50 feet activity   Assist Walk 50 feet with 2 turns activity did  not occur: Safety/medical concerns         Walk 150 feet activity   Assist Walk 150 feet activity did not occur: Safety/medical concerns         Walk 10 feet on uneven surface  activity   Assist Walk 10 feet on uneven surfaces activity did not occur: Safety/medical concerns         Wheelchair     Assist Is the patient using a wheelchair?: Yes Type of Wheelchair: Manual Wheelchair activity did not occur: Safety/medical concerns  Wheelchair assist level: Supervision/Verbal cueing Max wheelchair distance: 70    Wheelchair 50 feet with 2 turns activity    Assist    Wheelchair 50 feet with 2 turns activity did not  occur: Safety/medical concerns   Assist Level: Supervision/Verbal cueing   Wheelchair 150 feet activity     Assist  Wheelchair 150 feet activity did not occur: Safety/medical concerns   Assist Level: Moderate Assistance - Patient 50 - 74%   Blood pressure 119/78, pulse 91, temperature 97.9 F (36.6 C), temperature source Oral, resp. rate 17, height 6' 1.5" (1.867 m), weight 97.1 kg, SpO2 96 %.  Medical Problem List and Plan: 1.  Debility secondary to left lower extremity cellulitis complicated by acute respiratory failure/pneumonia/multi medical  Con't CIR_ move d/c to Friday 11/25  D/c tomorrow- equipment delivered- training in AM 2.  Antithrombotics: -DVT/anticoagulation:  Pharmaceutical: Xarelto.  Vascular ultrasound lower extremities negative             -antiplatelet therapy: N/A. 3. Pain: Continue Topamax 100 mg twice daily, Tramadol as needed  11/11- having back pain from previous fusions in neck/back - con't tramadol prn- also will ask therapy for wedge for propping.   11/22- needs tylenol - con't regimen  11/23- pain controlled this AM- con't regimen 4. Anxiety: Continue Xanax 0.5 mg nightly as needed             -antipsychotic agents: N/A 5. Neuropsych: This patient is capable of making decisions on his own behalf. 6. Skin/Wound Care: Routine skin checks 7. Fluids/Electrolytes/Nutrition: Routine in and outs 8.  Atrial fibrillation.  Cardiac rate controlled.  Continue Xarelto.  Tikosyn 500 mg twice daily, Toprol-XL 25 mg daily  11/16- con't Tikosyn and Xarelto- con't regimen  Rate controlled on 11/19 9.  Acute respiratory failure hypoxia related to aspiration pneumonia.  Oncology recommended high-dose steroids x2 weeks until 08/18/2021 followed by gradual taper of prednisone as per Dr. [name].  Continue BiPAP/oxygen therapy  Advance to regular thins  Albuterol prn nebs- ordered- reminded pt to ask for when he needs. 11/21- pt to go home on O2 and Nebs- wife needs ot  be trained how to do nebs.    11/22- asked her to take nebs this AM-since feels SOB- con't regimen otherwise  11/23- asked to train wife on nebs machine- also will schedule Albuterol nebs at 6am daily. 11/24- asked nurse to give nebs-   10.  Stage IV non-small cell lung cancer.  Palliative chemotherapy per oncology Dr. [name].  11/16- will not get chemo until prednisone down to 10 mg or less- con't prednisone gradual taper per Dr [name]  Supplemental oxygen dependent  11/21- will not start chemo until at 10 mg prednisone- also wrote to taper to 60 mg daily as of tomorrow  11/22- having decline in function- likely due to reduction in steroids, and progression of disease.  11.  Hyperlipidemia.  Lipitor/fenofibrate 12.  Parkinson's disease.  Sinemet 13.  Acute on  chronic anemia.  Follow-up CBC  Hemoglobin 10.1 on 11/13  Continue to monitor 14. Sore/dry throat  Biotene; and Throat spray, Chloraseptic ordered  11/21- asked nursing to get for pt.    LOS: 15 days A FACE TO FACE EVALUATION WAS PERFORMED  Ajaya Crutchfield 08/27/2021, 8:58 AM

## 2021-08-28 ENCOUNTER — Inpatient Hospital Stay (HOSPITAL_COMMUNITY): Payer: Medicare PPO

## 2021-08-28 MED ORDER — ALBUTEROL SULFATE (2.5 MG/3ML) 0.083% IN NEBU: 3.0000 mL | INHALATION_SOLUTION | RESPIRATORY_TRACT | 12 refills | Status: AC | PRN

## 2021-08-28 MED ORDER — IPRATROPIUM BROMIDE 0.03 % NA SOLN: 1.0000 | Freq: Two times a day (BID) | NASAL | 5 refills | Status: DC

## 2021-08-28 MED ORDER — IPRATROPIUM BROMIDE 0.02 % IN SOLN: 0.5000 mg | Freq: Two times a day (BID) | RESPIRATORY_TRACT | 12 refills | Status: DC

## 2021-08-28 MED ORDER — BUDESONIDE 0.5 MG/2ML IN SUSP: 0.5000 mg | Freq: Two times a day (BID) | RESPIRATORY_TRACT | 12 refills | Status: DC

## 2021-08-28 MED ORDER — LEVALBUTEROL HCL 0.63 MG/3ML IN NEBU: 0.6300 mg | INHALATION_SOLUTION | Freq: Two times a day (BID) | RESPIRATORY_TRACT | 12 refills | Status: DC

## 2021-08-28 NOTE — Progress Notes (Signed)
PROGRESS NOTE   Subjective/Complaints:   Wife didn't come in for family training- appears pt told her she didn't have to come in-   Was c/o SOB this AM and breathing very hard- sats 95-96%- on 4L-  Says "trying to breathe".  CXR done- looks stable. NO change  When went back to d/w pt about d/c; he had arranged for wife to not come in for training this AM.   Asked SW to get wife in for training- d/c tomorrow.   ROS:   Pt denies  abd pain, CP, N/V/C/D, and vision changes    Objective:   DG Chest 2 View  Result Date: 08/28/2021 CLINICAL DATA:  Shortness of breath, history of lung cancer. EXAM: CHEST - 2 VIEW COMPARISON:  August 21, 2021. FINDINGS: Stable cardiomediastinal silhouette. Right internal jugular Port-A-Cath is unchanged in position. No pneumothorax is noted. Stable bibasilar opacities are noted, left greater than right. Bony thorax is unremarkable. IMPRESSION: Stable bibasilar opacities are noted, left greater than right. Electronically Signed   By: Marijo Conception M.D.   On: 08/28/2021 08:53   No results for input(s): WBC, HGB, HCT, PLT in the last 72 hours.  No results for input(s): NA, K, CL, CO2, GLUCOSE, BUN, CREATININE, CALCIUM in the last 72 hours.   Intake/Output Summary (Last 24 hours) at 08/28/2021 1012 Last data filed at 08/28/2021 0715 Gross per 24 hour  Intake 720 ml  Output 515 ml  Net 205 ml        Physical Exam: Vital Signs Blood pressure 127/75, pulse 80, temperature 97.9 F (36.6 C), temperature source Oral, resp. rate 18, height 6' 1.5" (1.867 m), weight 97.9 kg, SpO2 99 %.      General: awake, alert, very upset- yelling at times; first seen on EOB, appeared SOB, either panic attack or resp distress; 2ns time sitting on toilet- looked great until mentioned was going home today, then started with what appeared to be panic again.  HENT: conjugate gaze; oropharynx moist CV: regular  rate; no JVD Pulmonary: almost retractions' pulling hard- sats 95-96% on 4L;  GI: soft, NT, ND, (+)BS Psychiatric: anxious and angry about d/c.  Neurological: Ox3  Musc: No edema in extremities.  No tenderness in extremities. Neuro: Alert Motor: Strength 4+/5 proximally in Ue's and LE's and 5-/5 distally, stable.  Assessment/Plan: 1. Functional deficits which require 3+ hours per day of interdisciplinary therapy in a comprehensive inpatient rehab setting. Physiatrist is providing close team supervision and 24 hour management of active medical problems listed below. Physiatrist and rehab team continue to assess barriers to discharge/monitor patient progress toward functional and medical goals  Care Tool:  Bathing    Body parts bathed by patient: Right arm, Left arm, Chest, Abdomen, Front perineal area, Face, Buttocks, Right upper leg, Left upper leg   Body parts bathed by helper: Buttocks Body parts n/a: Right lower leg, Left lower leg   Bathing assist Assist Level: Minimal Assistance - Patient > 75%     Upper Body Dressing/Undressing Upper body dressing   What is the patient wearing?: Pull over shirt    Upper body assist Assist Level: Set up assist  Lower Body Dressing/Undressing Lower body dressing      What is the patient wearing?: Incontinence brief, Pants     Lower body assist Assist for lower body dressing: Moderate Assistance - Patient 50 - 74%     Toileting Toileting    Toileting assist Assist for toileting: Minimal Assistance - Patient > 75%     Transfers Chair/bed transfer  Transfers assist     Chair/bed transfer assist level: Contact Guard/Touching assist     Locomotion Ambulation   Ambulation assist   Ambulation activity did not occur: Safety/medical concerns  Assist level: Contact Guard/Touching assist Assistive device: Walker-rolling Max distance: 18   Walk 10 feet activity   Assist  Walk 10 feet activity did not occur:  Safety/medical concerns  Assist level: Contact Guard/Touching assist Assistive device: Walker-rolling   Walk 50 feet activity   Assist Walk 50 feet with 2 turns activity did not occur: Safety/medical concerns         Walk 150 feet activity   Assist Walk 150 feet activity did not occur: Safety/medical concerns         Walk 10 feet on uneven surface  activity   Assist Walk 10 feet on uneven surfaces activity did not occur: Safety/medical concerns         Wheelchair     Assist Is the patient using a wheelchair?: Yes Type of Wheelchair: Manual Wheelchair activity did not occur: Safety/medical concerns  Wheelchair assist level: Supervision/Verbal cueing Max wheelchair distance: 70    Wheelchair 50 feet with 2 turns activity    Assist    Wheelchair 50 feet with 2 turns activity did not occur: Safety/medical concerns   Assist Level: Supervision/Verbal cueing   Wheelchair 150 feet activity     Assist  Wheelchair 150 feet activity did not occur: Safety/medical concerns   Assist Level: Moderate Assistance - Patient 50 - 74%   Blood pressure 127/75, pulse 80, temperature 97.9 F (36.6 C), temperature source Oral, resp. rate 18, height 6' 1.5" (1.867 m), weight 97.9 kg, SpO2 99 %.  Medical Problem List and Plan: 1.  Debility secondary to left lower extremity cellulitis complicated by acute respiratory failure/pneumonia/multi medical  Con't CIR_ move d/c to Friday 11/25  D/c tomorrow- family training today 2.  Antithrombotics: -DVT/anticoagulation:  Pharmaceutical: Xarelto.  Vascular ultrasound lower extremities negative             -antiplatelet therapy: N/A. 3. Pain: Continue Topamax 100 mg twice daily, Tramadol as needed  11/11- having back pain from previous fusions in neck/back - con't tramadol prn- also will ask therapy for wedge for propping.   11/22- needs tylenol - con't regimen  11/23- pain controlled this AM- con't regimen 4. Anxiety:  Continue Xanax 0.5 mg nightly as needed             -antipsychotic agents: N/A 5. Neuropsych: This patient is capable of making decisions on his own behalf. 6. Skin/Wound Care: Routine skin checks 7. Fluids/Electrolytes/Nutrition: Routine in and outs 8.  Atrial fibrillation.  Cardiac rate controlled.  Continue Xarelto.  Tikosyn 500 mg twice daily, Toprol-XL 25 mg daily  11/16- con't Tikosyn and Xarelto- con't regimen  Rate controlled on 11/19 9.  Acute respiratory failure hypoxia related to aspiration pneumonia.  Oncology recommended high-dose steroids x2 weeks until 08/18/2021 followed by gradual taper of prednisone as per Dr. [name].  Continue BiPAP/oxygen therapy  Advance to regular thins  Albuterol prn nebs- ordered- reminded pt to ask for when he needs.  11/21- pt to go home on O2 and Nebs- wife needs ot be trained how to do nebs.    11/22- asked her to take nebs this AM-since feels SOB- con't regimen otherwise  11/23- asked to train wife on nebs machine- also will schedule Albuterol nebs at 6am daily. 11/24- asked nurse to give nebs- 11/25- pt's wife took nebs machine home- concentrator has been delivered- CXR looks stable. Assured pt CXR looks the same.    10.  Stage IV non-small cell lung cancer.  Palliative chemotherapy per oncology Dr. [name].  11/16- will not get chemo until prednisone down to 10 mg or less- con't prednisone gradual taper per Dr [name]  Supplemental oxygen dependent  11/21- will not start chemo until at 10 mg prednisone- also wrote to taper to 60 mg daily as of tomorrow  11/22- having decline in function- likely due to reduction in steroids, and progression of disease.  11.  Hyperlipidemia.  Lipitor/fenofibrate 12.  Parkinson's disease.  Sinemet 13.  Acute on chronic anemia.  Follow-up CBC  Hemoglobin 10.1 on 11/13  Continue to monitor 14. Sore/dry throat  Biotene; and Throat spray, Chloraseptic ordered  11/21- asked nursing to get for pt.   I spent a  total of 49 minutes on total care today- >50% coordination of care- getting CXR, going over results; talking to pt 3x and entire team and PT_ will d/c tomorrow after family training.    LOS: 16 days A FACE TO FACE EVALUATION WAS PERFORMED  Corey [name] 08/28/2021, 10:12 AM

## 2021-08-28 NOTE — Progress Notes (Signed)
Physical Therapy Session Note  Patient Details  Name: Corey [name] MRN: [id number] Date of Birth: 01-Oct-1946  Today's Date: 08/28/2021 PT Individual Time: 1130-1230 PT Individual Time Calculation (min): 60 min   Short Term Goals: Week 2:  PT Short Term Goal 1 (Week 2): =LTGs d/t ELOS  Skilled Therapeutic Interventions/Progress Updates: Pt presented in recliner agreeable to therapy. Session to focus on family education with wife. Discussed with wife pt's current function (CGA to close supervision) and explained differences. Explained that pt is mostly close supervision but early in am may need CGA. Explained as per discussion with OT best to don TED hose prior to getting OOB in am as BP sometimes fluctuates.  Explained that pt has improved in ambulation distance over past few days and that pt is able to gauge well when needs to rest and SpO2 is nearing 90%. Discussed pt has decided to use transport chair for step to enter home with assistance of brother and son. Wife had many questions regarding concentrator and portable tanks. Explained that I could only show her the dial and how to connect hose and that would be best to have respiratory therapy provide more through education. Explained that pt is currently on 4L supplemental O2. Discussed that O2 line at home should be sufficient to ambulate bed to recliner to bathroom (74f per pt). Went over portable tank with wife and had wife connect [place] to tank. PTA then had wife ambulate with pt varying between CGA and close S. Pt felt comfortable with level of assistance required and pt continuing to demonstrate improvements with ambulation. Pt was able to ambulate 55fx 2 with wife with supervision for transfers. Upon return to room pt left in recliner with call bell within reach and needs met with most questions answered appropriately by PTA.      Therapy Documentation Precautions:  Precautions Precautions: Fall Precaution Comments: monitor sats  and  BP Restrictions Weight Bearing Restrictions: No General:   Vital Signs: Therapy Vitals Temp: 98.2 F (36.8 C) Temp Source: Oral Pulse Rate: 85 Resp: 18 BP: 105/78 Patient Position (if appropriate): Sitting Oxygen Therapy SpO2: 97 % O2 Device: Nasal Cannula Pain: Pain Assessment Pain Scale: 0-10 Pain Score: 0-No pain Mobility: Bed Mobility Supine to Sit: Supervision/Verbal cueing Transfers Transfers: Sit to Stand;Stand Pivot Transfers;Stand to Sit Sit to Stand: Contact Guard/Touching assist Stand to Sit: Contact Guard/Touching assist Stand Pivot Transfers: Contact Guard/Touching assist Stand Pivot Transfer Details: Verbal cues for sequencing;Verbal cues for precautions/safety;Verbal cues for safe use of DME/AE;Verbal cues for technique Transfer (Assistive device): Rolling walker Locomotion :    Trunk/Postural Assessment : Cervical Assessment Cervical Assessment: Within Functional Limits Thoracic Assessment Thoracic Assessment: Exceptions to [facility] Assessment Lumbar Assessment: Within Functional Limits Postural Control Postural Control: Deficits on evaluation  Balance: Balance Balance Assessed: Yes Static Sitting Balance Static Sitting - Balance Support: Feet supported Static Sitting - Level of Assistance: 5: Stand by assistance Dynamic Sitting Balance Dynamic Sitting - Balance Support: Feet supported Dynamic Sitting - Level of Assistance: 5: Stand by assistance Dynamic Sitting - Balance Activities: Reaching across midline;Reaching for objects Static Standing Balance Static Standing - Balance Support: During functional activity;Bilateral upper extremity supported Static Standing - Level of Assistance: 4: Min assist Dynamic Standing Balance Dynamic Standing - Balance Support: During functional activity;Bilateral upper extremity supported Dynamic Standing - Level of Assistance: 4: Min assist Dynamic Standing - Balance Activities: Lateral lean/weight  shifting;Forward lean/weight shifting Exercises:   Other Treatments:  Therapy/Group: Individual Therapy  Tracey Hermance 08/28/2021, 4:06 PM

## 2021-08-28 NOTE — Progress Notes (Signed)
Patient was placed on home unit NIV machine. 4L O2 bled into the circuit. Tolerating it well. No complications noted at this time.

## 2021-08-28 NOTE — Progress Notes (Signed)
Occupational Therapy Session Note  Patient Details  Name: Corey [name] MRN: [id number] Date of Birth: Jul 01, 1946  Today's Date: 08/28/2021 OT Individual Time: 4373-5789 OT Individual Time Calculation (min): 42 min    Short Term Goals: Week 1:  OT Short Term Goal 1 (Week 1): Pt will complete transfer to [facility] with LRAD MIN A OT Short Term Goal 1 - Progress (Week 1): Met OT Short Term Goal 2 (Week 1): Pt will don/doff pants min A OT Short Term Goal 2 - Progress (Week 1): Met OT Short Term Goal 3 (Week 1): Pt will perform grooming standing at sink with min A OT Short Term Goal 3 - Progress (Week 1): Progressing toward goal OT Short Term Goal 4 (Week 1): Pt will complete toileting tasks with LRAD and min A OT Short Term Goal 4 - Progress (Week 1): Met Week 2:  OT Short Term Goal 1 (Week 2): STGs = LTGs 2/2 ELOS at Supervision   Skilled Therapeutic Interventions/Progress Updates:    Pt greeted at time of session semireclined in bed on phone, requesting a few minutes to finish phone call in privacy. Resumed session and pt with no c/o pain but increased SOB with nursing staff aware, pt already had CXR this am. Donned TEDS bed level Max A, also provided new scrub pants to change into later. Supine > sit Supervision with bed features and stand pivot bed > wheelchair CGA with RW. RN present at this time for med pass. Note focus of session today on having pt direct care at home, education on O2 sats and what numbers should be, how to use pulse ox, LPB, and how to manage SOB at home with energy conservation. Self propel to sink and oral hygiene with Set up. Note Wife [name] not present today during family ed session, therefore focused on education with pt on directing his care. O2 sats 92% after activity with HR increasing to 111 at highest point during session. Pt needing to toilet at this time, obtained new O2 tank and transported to bathroom. Stand pivot wheelchair > BSC over toilet with CGA and set up  with O2 at 4L. NT present at this time and hand off to resume care.      Therapy Documentation Precautions:  Precautions Precautions: Fall Precaution Comments: monitor sats  and BP Restrictions Weight Bearing Restrictions: No     Therapy/Group: Individual Therapy  Viona Gilmore 08/28/2021, 7:18 AM

## 2021-08-28 NOTE — Progress Notes (Addendum)
Patient ID: Corey [name], male   DOB: 1946/10/01, 75 y.o.   MRN: [id number]  Per MD pt is not discharging today due to medical issues-hopeful for tomorrow. Spoke with wife who was confused regarding education today and pt telling her he was not leaving. Have re-scheduled for tomorrow at 10:00 wife needs to see mobility which tends to flucuate depending upon how pt feels and is breathing. Wife aware to be here tomorrow at 10:00  11:15 AM Met with pt and wife in the room to confirm tomorrow family education at 10:00 will be here whether he is discharged tomorrow or not. Referral made to [employer] for nebulizer medicines should deliver to home within 24 hours. Dan-PA also put through to [facility] in case not delivered in time for discharge.  1:03 PM Rosita-PT reports she educated wife on pt's mobility and level of care. Have also informed wife to get home nebulizer machine and bring into the hospital and have bedside RN educate both she and pt on how to use, due to feel [employer] will not get out to home this weekend to do this. Wife will get pt's brother to bring the home nebulizer back into the hospital so bedside RN can show them how to use.   1:22 PM Have updated Edith Endave regarding discharge tomorrow.

## 2021-08-28 NOTE — Progress Notes (Signed)
Occupational Therapy Discharge Summary  Patient Details  Name: Corey [name] MRN: [id number] Date of Birth: 1946/01/27    Patient has met 3 of 9 long term goals due to improved activity tolerance, improved balance, postural control, and improved coordination.  Patient to discharge at [facility] Assist level.  Patient's care partner is independent to provide the necessary physical assistance at discharge.  Pt is CGA for ADL transfers to the bed, wheelchair, and BSC with use of RW. Note the pt's skill performance has fluctuated during this hospitalization and has been as good as Supervision with tasks and needing up to Min/Mod when fatigued and SOB. Overall, pt at a CGA/Min A level. Pt requires 3-4L of O2 throughout ADL tasks. Family ed completed over the phone with wife Corey [name], multiple attempts made for in person training but unable to arrange at this time.   Reasons goals not met: Pt is CGA for ADL transfers, Min A for LB ADLs. Pt skill performance fluctuated significantly based on SOB/breathing and fatigue.   Recommendation:  Patient will benefit from ongoing skilled OT services in home health setting to continue to advance functional skills in the area of BADL and Reduce care partner burden.  Equipment: BSC, pt aware of where to purchase TTB later on when able to shower   Reasons for discharge: discharge from hospital  Patient/family agrees with progress made and goals achieved: Yes  OT Discharge Precautions/Restrictions  Precautions Precautions: Fall Precaution Comments: monitor sats  and BP Restrictions Weight Bearing Restrictions: No Pain Pain Assessment Pain Scale: 0-10 Pain Score: 0-No pain ADL ADL Equipment Provided: Other (comment) (Pt has used AE previously with success) Eating: Independent Where Assessed-Eating: Bed level Grooming: Modified independent Where Assessed-Grooming: Edge of bed Upper Body Bathing: Setup Where Assessed-Upper Body Bathing: Edge of  bed Lower Body Bathing: Minimal assistance Where Assessed-Lower Body Bathing: Edge of bed Upper Body Dressing: Setup Where Assessed-Upper Body Dressing: Edge of bed Lower Body Dressing: Minimal assistance Where Assessed-Lower Body Dressing: Edge of bed Toileting: Minimal assistance Where Assessed-Toileting: Bedside Commode Toilet Transfer: Therapist, music Method: Arts development officer: Radiographer, therapeutic: Moderate assistance Tub/Shower Transfer Method: Stand pivot Tub/Shower Equipment: Radio broadcast assistant, Energy manager: Moderate assistance Social research officer, government Method: Brewing technologist ADL Comments:  (Extra time needed for BADLs d/t fatigue, O2 sats and rest breaks) Vision Baseline Vision/History: 1 Wears glasses Patient Visual Report: No change from baseline Vision Assessment?: Vision impaired- to be further tested in functional context Perception  Perception: Within Functional Limits Praxis Praxis: Intact Cognition Overall Cognitive Status: Within Functional Limits for tasks assessed Arousal/Alertness: Awake/alert Orientation Level: Oriented X4 Year: 2022 Month: November Day of Week: Correct Attention: Focused Focused Attention: Appears intact Memory: Impaired Memory Impairment: Decreased short term memory Decreased Short Term Memory: Verbal basic Immediate Memory Recall: Sock;Blue;Bed Memory Recall Sock: With Cue Memory Recall Blue: Without Cue Memory Recall Bed: Without Cue Awareness: Appears intact Problem Solving: Appears intact Safety/Judgment: Appears intact Sensation Sensation Light Touch: Appears Intact Proprioception: Appears Intact Coordination Gross Motor Movements are Fluid and Coordinated: No Fine Motor Movements are Fluid and Coordinated: No Coordination and Movement Description: mildy uncoordinated d/t generalized weakness and tremors in B  hands at times Motor  Motor Motor - Skilled Clinical Observations: Generalized weakness/decreased endurance 2/2 CA, tremors limiting as well Mobility  Bed Mobility Supine to Sit: Supervision/Verbal cueing Transfers Sit to Stand: Contact Guard/Touching assist Stand to Sit: Contact  Guard/Touching assist  Trunk/Postural Assessment  Cervical Assessment Cervical Assessment: Within Functional Limits Thoracic Assessment Thoracic Assessment: Exceptions to [facility] Lumbar Assessment Lumbar Assessment: Within Functional Limits Postural Control Postural Control: Deficits on evaluation  Balance Balance Balance Assessed: Yes Static Sitting Balance Static Sitting - Balance Support: Feet supported Static Sitting - Level of Assistance: 5: Stand by assistance Dynamic Sitting Balance Dynamic Sitting - Balance Support: Feet supported Dynamic Sitting - Level of Assistance: 5: Stand by assistance Dynamic Sitting - Balance Activities: Reaching across midline;Reaching for objects Static Standing Balance Static Standing - Balance Support: During functional activity;Bilateral upper extremity supported Static Standing - Level of Assistance: 4: Min assist Dynamic Standing Balance Dynamic Standing - Balance Support: During functional activity;Bilateral upper extremity supported Dynamic Standing - Level of Assistance: 4: Min assist Dynamic Standing - Balance Activities: Lateral lean/weight shifting;Forward lean/weight shifting Extremity/Trunk Assessment RUE Assessment RUE Assessment: Exceptions to [facility] General Strength Comments: generalized weakness and tremors limiting LUE Assessment LUE Assessment: Exceptions to [facility] General Strength Comments: generalized weakness and tremors limiting   Viona Gilmore 08/28/2021, 12:40 PM

## 2021-08-29 NOTE — Progress Notes (Signed)
PROGRESS NOTE   Subjective/Complaints: D/c today CXR yesterday stable Wife coming in for nebulizer training today He is excited to go home  ROS:   Pt denies abd pain, CP, N/V/C/D, and vision changes    Objective:   DG Chest 2 View  Result Date: 08/28/2021 CLINICAL DATA:  Shortness of breath, history of lung cancer. EXAM: CHEST - 2 VIEW COMPARISON:  August 21, 2021. FINDINGS: Stable cardiomediastinal silhouette. Right internal jugular Port-A-Cath is unchanged in position. No pneumothorax is noted. Stable bibasilar opacities are noted, left greater than right. Bony thorax is unremarkable. IMPRESSION: Stable bibasilar opacities are noted, left greater than right. Electronically Signed   By: Marijo Conception M.D.   On: 08/28/2021 08:53   No results for input(s): WBC, HGB, HCT, PLT in the last 72 hours.  No results for input(s): NA, K, CL, CO2, GLUCOSE, BUN, CREATININE, CALCIUM in the last 72 hours.   Intake/Output Summary (Last 24 hours) at 08/29/2021 0914 Last data filed at 08/29/2021 0819 Gross per 24 hour  Intake 660 ml  Output 900 ml  Net -240 ml        Physical Exam: Vital Signs Blood pressure 121/80, pulse 77, temperature 98.9 F (37.2 C), temperature source Oral, resp. rate 16, height 6' 1.5" (1.867 m), weight 99.1 kg, SpO2 98 %. Gen: no distress, normal appearing HEENT: oral mucosa pink and moist, NCAT Cardio: Reg rate Chest: normal effort, normal rate of breathing Abd: soft, non-distended Ext: no edema Psych: pleasant, normal affect Skin: intact  Musc: No edema in extremities.  No tenderness in extremities. Neuro: Alert Motor: Strength 4+/5 proximally in Ue's and LE's and 5-/5 distally, stable.  Assessment/Plan: 1. Functional deficits which require 3+ hours per day of interdisciplinary therapy in a comprehensive inpatient rehab setting. Physiatrist is providing close team supervision and 24 hour  management of active medical problems listed below. Physiatrist and rehab team continue to assess barriers to discharge/monitor patient progress toward functional and medical goals  Care Tool:  Bathing    Body parts bathed by patient: Right arm, Left arm, Chest, Abdomen, Front perineal area, Face, Buttocks, Right upper leg, Left upper leg   Body parts bathed by helper: Buttocks Body parts n/a: Right lower leg, Left lower leg   Bathing assist Assist Level: Minimal Assistance - Patient > 75%     Upper Body Dressing/Undressing Upper body dressing   What is the patient wearing?: Pull over shirt    Upper body assist Assist Level: Set up assist    Lower Body Dressing/Undressing Lower body dressing      What is the patient wearing?: Incontinence brief, Pants     Lower body assist Assist for lower body dressing: Minimal Assistance - Patient > 75%     Toileting Toileting    Toileting assist Assist for toileting: Minimal Assistance - Patient > 75%     Transfers Chair/bed transfer  Transfers assist     Chair/bed transfer assist level: Supervision/Verbal cueing     Locomotion Ambulation   Ambulation assist   Ambulation activity did not occur: Safety/medical concerns  Assist level: Supervision/Verbal cueing Assistive device: Walker-rolling Max distance: 13ft   Walk 10 feet  activity   Assist  Walk 10 feet activity did not occur: Safety/medical concerns  Assist level: Supervision/Verbal cueing Assistive device: Walker-rolling   Walk 50 feet activity   Assist Walk 50 feet with 2 turns activity did not occur: Safety/medical concerns  Assist level: Supervision/Verbal cueing Assistive device: Walker-rolling    Walk 150 feet activity   Assist Walk 150 feet activity did not occur: Safety/medical concerns         Walk 10 feet on uneven surface  activity   Assist Walk 10 feet on uneven surfaces activity did not occur: Safety/medical concerns          Wheelchair     Assist Is the patient using a wheelchair?: Yes Type of Wheelchair: Manual Wheelchair activity did not occur: Safety/medical concerns  Wheelchair assist level: Independent Max wheelchair distance: 70    Wheelchair 50 feet with 2 turns activity    Assist    Wheelchair 50 feet with 2 turns activity did not occur: Safety/medical concerns   Assist Level: Independent   Wheelchair 150 feet activity     Assist  Wheelchair 150 feet activity did not occur: Safety/medical concerns   Assist Level: Moderate Assistance - Patient 50 - 74%   Blood pressure 121/80, pulse 77, temperature 98.9 F (37.2 C), temperature source Oral, resp. rate 16, height 6' 1.5" (1.867 m), weight 99.1 kg, SpO2 98 %.  Medical Problem List and Plan: 1.  Debility secondary to left lower extremity cellulitis complicated by acute respiratory failure/pneumonia/multi medical  D/c today 2.  Antithrombotics: -DVT/anticoagulation:  Pharmaceutical: Xarelto.  Vascular ultrasound lower extremities negative             -antiplatelet therapy: N/A. 3. Pain:  Topamax 100 mg twice daily, Tramadol as needed  11/11- having back pain from previous fusions in neck/back - con't tramadol prn- also will ask therapy for wedge for propping.   11/22- needs tylenol - con't regimen  11/23- pain controlled this AM- con't regimen  11/26: will provide with pain relief journal 4. Anxiety: Continue Xanax 0.5 mg nightly as needed. Provided with a list of foods that can help with anxiety.              -antipsychotic agents: N/A 5. Neuropsych: This patient is capable of making decisions on his own behalf. 6. Skin/Wound Care: Routine skin checks 7. Fluids/Electrolytes/Nutrition: Routine in and outs 8.  Atrial fibrillation.  Cardiac rate controlled.  Continue Xarelto.  Tikosyn 500 mg twice daily, Toprol-XL 25 mg daily  11/16- con't Tikosyn and Xarelto- con't regimen  Rate controlled on 11/19 9.  Acute respiratory  failure hypoxia related to aspiration pneumonia.  Oncology recommended high-dose steroids x2 weeks until 08/18/2021 followed by gradual taper of prednisone as per Dr. [name].  Continue BiPAP/oxygen therapy  Advance to regular thins  Albuterol prn nebs- ordered- reminded pt to ask for when he needs. 11/21- pt to go home on O2 and Nebs- wife needs ot be trained how to do nebs.    11/22- asked her to take nebs this AM-since feels SOB- con't regimen otherwise  11/23- asked to train wife on nebs machine- also will schedule Albuterol nebs at 6am daily. 11/24- asked nurse to give nebs- 11/25- pt's wife took nebs machine home- concentrator has been delivered- CXR looks stable. Assured pt CXR looks the same.    10.  Stage IV non-small cell lung cancer.  Palliative chemotherapy per oncology Dr. [name].  11/16- will not get chemo until prednisone down to 10  mg or less- con't prednisone gradual taper per Dr [name]  Supplemental oxygen dependent  11/21- will not start chemo until at 10 mg prednisone- also wrote to taper to 60 mg daily as of tomorrow  11/22- having decline in function- likely due to reduction in steroids, and progression of disease.  11.  Hyperlipidemia.  Lipitor/fenofibrate 12.  Parkinson's disease.  Sinemet 13.  Acute on chronic anemia.  Follow-up CBC  Hemoglobin 10.1 on 11/13  Continue to monitor 14. Sore/dry throat  Biotene; and Throat spray, Chloraseptic ordered  11/21- asked nursing to get for pt.    >30 minutes spent in discharge of patient including review of medications and follow-up appointments, physical examination, and in answering all patient's questions    LOS: 17 days A FACE TO FACE EVALUATION WAS PERFORMED  Clide Deutscher Lenna Hagarty 08/29/2021, 9:14 AM

## 2021-08-29 NOTE — Progress Notes (Signed)
Pt discharged off unit with wife, and all belongings. Pt and wife educated by RT regarding at home neb treatments.  No c/o of pain, given a PRN albuterol treatment. No issues or complications noted.  Dayna Ramus

## 2021-08-29 NOTE — Progress Notes (Signed)
Inpatient Rehabilitation Discharge Medication Review by a Pharmacist - Follow Up  A complete drug regimen review was completed for this patient to identify any potential clinically significant medication issues.  High Risk Drug Classes Is patient taking? Indication by Medication  Antipsychotic No   Anticoagulant Yes Xarelto for Afib  Antibiotic No   Opioid Yes PRN tramadol for pain  Antiplatelet No   Hypoglycemics/insulin No   Vasoactive Medication Yes Tikosyn, Metoprolol succinate for Afib  Chemotherapy No   Other Yes Sinemet for Parkinson's Flomax for BPH Dapsone, nebs for ILD / High dose prednisone taper for cancer, ARF Lipitor, fenofibrate for HLD Topamax for mood PRN Xanax for anxiety     Type of Medication Issue Identified Description of Issue Recommendation(s)  Drug Interaction(s) (clinically significant)     Duplicate Therapy     Allergy     No Medication Administration End Date     Incorrect Dose     Additional Drug Therapy Needed     Significant med changes from prior encounter (inform family/care partners about these prior to discharge).    Other       Clinically significant medication issues were identified that warrant physician communication and completion of prescribed/recommended actions by midnight of the next day:  No  Pharmacist comments: Steroid being tapered  Time spent performing this drug regimen review (minutes):  20 minutes   Marcene Corning 08/29/2021 7:30 AM

## 2021-09-15 ENCOUNTER — Ambulatory Visit: Payer: Medicare PPO | Admitting: Internal Medicine

## 2021-09-15 ENCOUNTER — Other Ambulatory Visit: Payer: Medicare PPO

## 2021-09-15 ENCOUNTER — Encounter: Payer: Self-pay | Admitting: *Deleted

## 2021-09-15 ENCOUNTER — Telehealth: Payer: Self-pay | Admitting: Internal Medicine

## 2021-09-15 ENCOUNTER — Ambulatory Visit: Payer: Medicare PPO

## 2021-09-15 NOTE — Telephone Encounter (Signed)
Scheduled per sch msg. Patient wanted to push out to 3 weeks. Confirmed appt

## 2021-09-15 NOTE — Progress Notes (Signed)
Per Dr. [name], he would like to see patient in 1-2 weeks.  I completed a scheduling message for them to call and arrange.

## 2021-09-20 ENCOUNTER — Other Ambulatory Visit: Payer: Self-pay

## 2021-09-24 ENCOUNTER — Telehealth: Payer: Self-pay | Admitting: Medical Oncology

## 2021-09-24 NOTE — Telephone Encounter (Signed)
Requests refills for Dapsone, budesonide and lortadine.  He also requested a refill on potassium . Instructed him to contact pharmacy because I see he has 1 refill.  In basket Message sent to Dr Valeta Harms re: budesonide And message sent to [place] re refill request for Dapsone and Lortadine.

## 2021-09-25 ENCOUNTER — Other Ambulatory Visit: Payer: Self-pay | Admitting: Medical Oncology

## 2021-09-25 ENCOUNTER — Telehealth: Payer: Self-pay | Admitting: Pulmonary Disease

## 2021-09-25 DIAGNOSIS — C3492 Malignant neoplasm of unspecified part of left bronchus or lung: Secondary | ICD-10-CM

## 2021-09-25 MED ORDER — STIOLTO RESPIMAT 2.5-2.5 MCG/ACT IN AERS: 2.0000 | INHALATION_SPRAY | Freq: Every day | RESPIRATORY_TRACT | 5 refills | Status: DC

## 2021-09-25 MED ORDER — BUDESONIDE 0.5 MG/2ML IN SUSP: 0.5000 mg | Freq: Two times a day (BID) | RESPIRATORY_TRACT | 12 refills | Status: DC

## 2021-09-25 MED ORDER — LEVALBUTEROL HCL 0.63 MG/3ML IN NEBU: 0.6300 mg | INHALATION_SOLUTION | Freq: Two times a day (BID) | RESPIRATORY_TRACT | 12 refills | Status: DC

## 2021-09-25 MED ORDER — BUDESONIDE 0.5 MG/2ML IN SUSP: 0.5000 mg | Freq: Two times a day (BID) | RESPIRATORY_TRACT | 12 refills | Status: AC

## 2021-09-25 MED ORDER — STIOLTO RESPIMAT 2.5-2.5 MCG/ACT IN AERS: 2.0000 | INHALATION_SPRAY | Freq: Every day | RESPIRATORY_TRACT | 5 refills | Status: AC

## 2021-09-25 NOTE — Telephone Encounter (Signed)
Staff message received:   RE: refills for Budesonide , Dapsone and Lortadine Received: Today Icard, Octavio Graves, DO  Bell, Diane Lemmie Evens, RN Cc: P [facility] Triage Pool Triage,  Please submit refills for all inhalers and nebs  Thanks  BLI        Previous Messages   ----- Message -----  From: Ardeen Garland, RN  Sent: 09/24/2021   3:19 PM EST  To: Cathlyn Parsons, PA-C, Garner Nash, DO  Subject: refills for Budesonide , Dapsone and Lortadi*   Dr Valeta Harms Wille Glaser is asking for a refill for budesonide . Will you refill it?   Lauraine Rinne -Izyk is asking for refill for Dapsone and Lortadine-Will you refill these 2?   Thanks ,  Diane RN @ cancer center.    Called and spoke with pt about meds at he was needing refilled and stated to him that we would get it taken care of for him. Pt verbalized understanding. Meds have been sent to pharmacy for pt. Nothing further needed.

## 2021-09-29 NOTE — Progress Notes (Signed)
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     Molecular Studies by [employer]: [id number] 0.5%   Olaparib Yes [id number] 50.9%   Olaparib Yes [id number] 0.8% None      PDL1: 30%  PRIOR THERAPY: 1) Palliative radiotherapy to the obstructive lung mass in the left lung under the care of Dr. MTammi Klippel Last treatment 03/23/21  CURRENT THERAPY: Palliative systemic chemotherapy with carboplatin AUC 5, Alimta 500 mg per metered squared, Keytruda 200 mg IV every 3 weeks.  First dose expected on 03/31/21. Status post 5 cycles. His dose of carboplatin was reduced to AUC of 4 and his Alimta was reduced to 400 mg/m2. Starting from cycle #5 the patient had been on maintenance Alimta and Keytruda.  The patient since treatment has been on hold since 07/04/2021 due to hospitalizations.   INTERVAL HISTORY: Corey LEVENTHAL89y.[id number]. male returns to the clinic today for a follow-up visit accompanied by his wife.  The patient was last seen in the clinic on 07/14/2021.  In the interval since that last appointment, the patient was hospitalized from 07/28/2021-08/12/2021 for cellulitis and aspiration pneumonia versus drug-induced pneumonitis.  The patient completed IV antibiotics.  He has 2 weeks left of his high-dose prednisone taper.  He is currently taking 10 mg p.o. daily.  The patient was then admitted to inpatient rehab until the end of August 28, 2021.  The patient had some difficulties participating in rehabilitation due to his debility from his acute on chronic respiratory failure.  He [facility] also [facility] which has been very helpful for him.  Overall, the patient still has some weakness but he is very pleased because he has been able to meet some of his goals such as walking up the steps and being able to ambulate to the restroom.  He states overall his weakness is proved compared to when he went into the hospital and when he was discharged from rehab.    He denies any fever or night sweats.  He reports a strong appetite since being on the high-dose prednisone taper.  He reports he has some dyspnea on exertion and occasional chest tightness for which he uses albuterol nebulizers.  He has been very diligent about using his incentive spirometer.  He notes that he continues to have a cough that is dry.  He is requesting a refill of his Tessalon.  He denies any hemoptysis or chest pain he denies any nausea, vomiting, diarrhea, or constipation.  Denies any headache or visual changes.  He denies any rashes or skin changes.  The patient is here today for evaluation and more detailed discussion about his current condition and consideration for resuming treatment.       MEDICAL HISTORY: Past Medical History:  Diagnosis Date   Anemia    Anxiety    Arthritis    "back, right knee, hands, ankles, neck" (05/31/2016)   Carotid artery disease ([place] 08/30/2016   Carotid [place]: R 1-39, L 40-59 // Carotid [place]/2020: R 1-39; L 40-59 // Carotid [place]21: R 1-39; L 40-59>> repeat 1 year    Chronic lower  back pain    Coronary atherosclerosis of native coronary artery    a. BMS to [facility] 2004 and 2007, otherwise mild nonobstructive disease. EF normal.   Diverticulitis    Dyslipidemia    Dyspnea    Essential hypertension, benign    Family history of breast cancer    Family history of lung cancer    Family history of prostate cancer    GERD (gastroesophageal reflux disease)    Lumbar radiculopathy, chronic 02/04/2015   Right L5   Obesity    OSA on CPAP    uses cpap    Paroxysmal atrial fibrillation ([place])    a. Discovered after stroke.   Pneumonia 01/1996   PONV (postoperative nausea and vomiting)    Prostate CA ([place])    Recurrent upper respiratory infection (URI)    Stroke ([place]) 07/2012   no deficits   Visit for monitoring Tikosyn therapy 09/18/2019    ALLERGIES:  is allergic to amoxicillin, fosinopril, hydromorphone, ampicillin, codeine, monopril [fosinopril sodium], other, and rosuvastatin.  MEDICATIONS:  Current Outpatient Medications  Medication Sig Dispense Refill   acetaminophen (TYLENOL) 325 MG tablet Take 2 tablets (650 mg total) by mouth every 6 (six) hours as needed for mild pain (or Fever >/= 101).     albuterol (PROVENTIL) (2.5 MG/3ML) 0.083% nebulizer solution Inhale 3 mLs into the lungs every 4 (four) hours as needed for wheezing or shortness of breath. 75 mL 12   ALPRAZolam (XANAX) 0.5 MG tablet Take 1 tablet (0.5 mg total) by mouth at bedtime as needed for anxiety. 10 tablet 0   atorvastatin (LIPITOR) 40 MG tablet Take 1 tablet (40 mg total) by mouth daily. 90 tablet 3   benzonatate (TESSALON) 200 MG capsule Take 1 capsule (200 mg total) by mouth 3 (three) times daily. 90 capsule 0   budesonide (PULMICORT) 0.5 MG/2ML nebulizer solution Take 2 mLs (0.5 mg total) by nebulization 2 (two) times daily. 60 mL 12   carbidopa-levodopa (SINEMET IR) 25-100 MG tablet Take 1 tablet by mouth 3 (three) times daily. (Patient taking differently: Take 1 tablet by mouth 3 (three) times daily. 0800, 1200, 1600) 270 tablet 1   chlorpheniramine-HYDROcodone (TUSSIONEX) 10-8 MG/5ML SUER Take 5 mLs by mouth every 12 (twelve) hours as needed for cough. 473 mL 0   dofetilide (TIKOSYN) 500 MCG capsule Take 1 capsule (500 mcg total) by mouth 2 (two) times daily. Please keep upcoming appointment in February for future refills. Thank you 180 capsule 3   fenofibrate 160 MG tablet TAKE 1 TABLET DAILY. PLEASE KEEP UPCOMING APPT IN MARCH WITH DR. Acie Fredrickson BEFORE ANYMORE  REFILLS. 90 tablet 3   fluticasone (FLONASE) 50 MCG/ACT nasal spray Place 2 sprays into both nostrils daily. 64.3 mL 5   folic acid (FOLVITE) 1 MG tablet Take 1 tablet (1 mg total) by mouth daily. 90 tablet 1   guaiFENesin (MUCINEX) 600 MG 12 hr tablet Take 2 tablets (1,200 mg total) by mouth 2 (two) times daily.     ipratropium (ATROVENT) 0.02 % nebulizer solution Take 2.5 mLs (0.5 mg total) by nebulization 2 (two) times daily. 75 mL 12   ipratropium (ATROVENT) 0.03 % nasal spray Place 1-2 sprays into both nostrils every 12 (twelve) hours. For drainage 30 mL 5   levalbuterol (XOPENEX) 0.63 MG/3ML nebulizer solution Take 3 mLs (0.63 mg total) by nebulization 2 (two) times daily. 3 mL 12   loratadine (CLARITIN) 10 MG tablet Take 1 tablet (10 mg total) by mouth daily as needed  for allergies (nasal drainage). 30 tablet 0   magnesium oxide (MAG-OX) 400 (240 Mg) MG tablet Take 1 tablet (400 mg total) by mouth daily. 90 tablet 2   metoprolol succinate (TOPROL-XL) 25 MG 24 hr tablet Take 1 tablet (25 mg total) by mouth daily. 90 tablet 1   Multiple Vitamins-Minerals (MULTIVITAMINS THER. W/MINERALS) TABS Take 1 tablet by mouth daily.     nitroGLYCERIN (NITROSTAT) 0.4 MG SL tablet Place 1 tablet (0.4 mg total) under the tongue every 5 (five) minutes as needed for chest pain. 25 tablet 5   omeprazole (PRILOSEC) 20 MG capsule Take 1 capsule (20 mg total) by mouth daily. 30 capsule 5   polyethylene glycol (MIRALAX / GLYCOLAX) 17 g packet Take 17 g by mouth daily. 14 each 0   potassium chloride (KLOR-CON) 10 MEQ tablet TAKE 1 TABLET BY MOUTH EVERY DAY (Patient taking differently: Take 10 mEq by mouth daily.) 90 tablet 1   predniSONE (DELTASONE) 10 MG tablet 6 tablets daily x6 days then 4 tablets daily x7 days then 2 tablets daily x7 days then 1 tab daily 120 tablet 0   rivaroxaban (XARELTO) 20 MG TABS tablet Take 1 tablet (20 mg total) by mouth daily. 90 tablet 2   Saccharomyces boulardii (PROBIOTIC) 250 MG CAPS  Take 250 mg by mouth daily.     senna-docusate (SENOKOT-S) 8.6-50 MG tablet Take 1 tablet by mouth 2 (two) times daily.     tamsulosin (FLOMAX) 0.4 MG CAPS capsule Take 1 capsule (0.4 mg total) by mouth daily after supper. 30 capsule 0   Tiotropium Bromide-Olodaterol (STIOLTO RESPIMAT) 2.5-2.5 MCG/ACT AERS Inhale 2 puffs into the lungs daily. 4 g 5   topiramate (TOPAMAX) 50 MG tablet Take 2 tablets (100 mg total) by mouth 2 (two) times daily. 360 tablet 3   traMADol (ULTRAM) 50 MG tablet Take 1 tablet (50 mg total) by mouth every 6 (six) hours as needed for moderate pain. 30 tablet 0   traZODone (DESYREL) 50 MG tablet Take 1 tablet (50 mg total) by mouth at bedtime. 30 tablet 0   Vitamin D, Ergocalciferol, (DRISDOL) 1.25 MG (50000 UNIT) CAPS capsule Take 1 capsule (50,000 Units total) by mouth every 7 (seven) days. Monday 12 capsule 0   No current facility-administered medications for this visit.    SURGICAL HISTORY:  Past Surgical History:  Procedure Laterality Date   ADENOIDECTOMY     ANTERIOR CERVICAL DECOMP/DISCECTOMY FUSION  07/2001; 10/2002   "C5-6; C6-7; redo"   BACK SURGERY     CARPAL TUNNEL RELEASE Left 10/2015   CHEST TUBE INSERTION Left 03/17/2021   Procedure: INSERTION PLEURAL DRAINAGE CATHETER;  Surgeon: Garner Nash, DO;  Location: [facility];  Service: Pulmonary;  Laterality: Left;  Indwelling Tunneled Pleural catheter (PLEUREX)    COLONOSCOPY W/ POLYPECTOMY  02/2014   CORONARY ANGIOPLASTY WITH STENT PLACEMENT  05/2003; 12/2005   "mid RCA; mid RCA"   FINE NEEDLE ASPIRATION  02/26/2021   Procedure: FINE NEEDLE ASPIRATION (FNA) LINEAR;  Surgeon: Candee Furbish, MD;  [facility] END[facility]COPY;  Service: Pulmonary;;   HAND SURGERY  02/2019   LEFT HAND   implantable loop recorder placement  02/27/2020   [employer] [place] model [id number]  implantable loop recorder implanted by Dr Rayann Heman for afib management and evaluation of presyncope   IR IMAGING GUIDED PORT  INSERTION  04/16/2021   JOINT REPLACEMENT     KNEE ARTHROSCOPY Left 10/2005   KNEE ARTHROSCOPY W/ PARTIAL MEDIAL MENISCECTOMY Left  09/2005   LUMBAR LAMINECTOMY/DECOMPRESSION MICRODISCECTOMY  03/2005   "L4-5"   POSTERIOR LUMBAR FUSION  10/2003   L5-S1; "plates, screws"   SHOULDER ARTHROSCOPY Right 08/2011   Debridement of labrum, arthroscopic distal clavicle excision   SHOULDER OPEN ROTATOR CUFF REPAIR Left 07/2014   TEE WITHOUT CARDIOVERSION  07/07/2012   Procedure: TRANSESOPHAGEAL ECHOCARDIOGRAM (TEE);  Surgeon: Fay Records, MD;  Location: [facility] ENDOSCOPY;  Service: Cardiovascular;  Laterality: N/A;   THORACENTESIS N/A 02/25/2021   Procedure: Mathews Robinsons;  Surgeon: Juanito Doom, MD;  Location: [place] [place];  Service: Cardiopulmonary;  Laterality: N/A;   TONSILLECTOMY AND ADENOIDECTOMY  ~ 1956   TOTAL KNEE ARTHROPLASTY Left 10/2006   TRIGGER FINGER RELEASE Left 10/2015   VIDEO BRONCHOSCOPY WITH ENDOBRONCHIAL ULTRASOUND Left 02/26/2021   Procedure: VIDEO BRONCHOSCOPY WITH ENDOBRONCHIAL ULTRASOUND;  Surgeon: Candee Furbish, MD;  Location: [facility] ENDOSCOPY;  Service: Pulmonary;  Laterality: Left;  cryoprobe too thanks!    REVIEW OF SYSTEMS:   Review of Systems  Constitutional: Positive for fatigue and generalized weakness.  Negative for appetite change, chills, fever and unexpected weight change.  HENT: Negative for mouth sores, nosebleeds, sore throat and trouble swallowing.   Eyes: Negative for eye problems and icterus.  Respiratory: Positive for dyspnea on exertion and cough.  Negative for hemoptysis and wheezing.   Cardiovascular: Negative for chest pain and leg swelling.  Gastrointestinal: Negative for abdominal pain, constipation, diarrhea, nausea and vomiting.  Genitourinary: Negative for bladder incontinence, difficulty urinating, dysuria, frequency and hematuria.   Musculoskeletal: Negative for back pain, gait problem, neck pain and neck stiffness.  Skin: Negative for itching and  rash.  Neurological: Negative for dizziness, extremity weakness, gait problem, headaches, light-headedness and seizures.  Hematological: Negative for adenopathy. Does not bruise/bleed easily.  Psychiatric/Behavioral: Negative for confusion, depression and sleep disturbance. The patient is not nervous/anxious.     PHYSICAL EXAMINATION:  Blood pressure 124/77, pulse (!) 103, temperature (!) 97.5 F (36.4 C), temperature source Temporal, resp. rate 18, height 6' 1.5" (1.867 m), weight 224 lb 1.6 oz (101.7 kg), SpO2 100 %.  ECOG PERFORMANCE STATUS: 2-3  Physical Exam  Constitutional: Oriented to person, place, and time and well-developed, well-nourished, and in no distress.  HENT:  Head: Normocephalic and atraumatic.  Mouth/Throat: Oropharynx is clear and moist. No oropharyngeal exudate.  Eyes: Conjunctivae are normal. Right eye exhibits no discharge. Left eye exhibits no discharge. No scleral icterus.  Neck: Normal range of motion. Neck supple.  Cardiovascular: Normal rate, regular rhythm, normal heart sounds and intact distal pulses.   Pulmonary/Chest: Effort normal and breath sounds normal. No respiratory distress. No wheezes. No rales.  On supplemental oxygen via nasal cannula. Abdominal: Soft. Bowel sounds are normal. Exhibits no distension and no mass. There is no tenderness.  Musculoskeletal: Normal range of motion. Exhibits no edema.  Lymphadenopathy:    No cervical adenopathy.  Neurological: Alert and oriented to person, place, and time.  Muscle wasting.  Examined in the wheelchair.   Skin: Skin is warm and dry. No rash noted. Not diaphoretic. No erythema. No pallor.  Psychiatric: Mood, memory and judgment normal.  Vitals reviewed.  LABORATORY DATA: Lab Results  Component Value Date   WBC 6.9 10/07/2021   HGB 9.4 (L) 10/07/2021   HCT 30.0 (L) 10/07/2021   MCV 110.7 (H) 10/07/2021   PLT 245 10/07/2021      Chemistry      Component Value Date/Time   NA 136 10/07/2021  1435   NA 137 01/21/2021  1553   K 4.2 10/07/2021 1435   CL 105 10/07/2021 1435   CO2 25 10/07/2021 1435   BUN 25 (H) 10/07/2021 1435   BUN 17 01/21/2021 1553   CREATININE 0.77 10/07/2021 1435   CREATININE 1.04 08/30/2016 0840      Component Value Date/Time   CALCIUM 8.9 10/07/2021 1435   ALKPHOS 53 10/07/2021 1435   AST 20 10/07/2021 1435   ALT 10 10/07/2021 1435   BILITOT 0.4 10/07/2021 1435       RADIOGRAPHIC STUDIES:  No results found.   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  He is negative for any actionable mutations. His PDL1 expression is 30%.  The patient completed palliative radiotherapy to the obstructive lung mass in the left lung under the care of Dr. Tammi Klippel.  Last treatment on 03/23/2021.   He is currently undergoing palliative systemic chemotherapy. His treatment consists carboplatin for an AUC of 5, Alimta 500 mg/m, and Keytruda 200 mg IV every 3 weeks. He is status post 6 cycles.  He is having progressive fatigue and weakness with treatment and anemia requiring transfusion support.  His dose of carboplatin was reduced as an AUC of 4 and his Alimta was reduced to 400 mg per metered square.  Starting from cycle #5, the patient started maintenance Alimta and Keytruda.  His treatment has been on hold since 07/14/2021 due to hospitalizations for possible pneumonia versus drug-induced pneumonitis due to [facility].  The patient was seen with Dr. [name] today.  Dr. [name] a lengthy discussion with the patient today about his current condition and treatment options.  Dr. [name] recommends arranging for restaging CT scan of chest, abdomen,  and pelvis to establish new baseline and to follow-up on the possible pneumonia versus pneumonitis..   I will arrange for this to be performed in the next week or so.  We will see the patient back for follow-up visit in 2 weeks for evaluation to review his scan results and for more detailed discussion about his current condition and recommended treatment options.  Dr. [name] will likely not resume immunotherapy with Keytruda due to history of possible pneumonitis.  The patient will continue his high-dose prednisone taper.  He has 10 mg p.o. daily for the next 2 weeks.   He will continue with physical therapy.   I have refilled his Tessalon today for his cough.  The patient was advised to call immediately if he has any concerning symptoms in the interval. The patient voices understanding of current disease status and treatment options and is in agreement with the current care plan. All questions were answered. The patient knows to call the clinic with any problems, questions or concerns. We can certainly see the patient much sooner if necessary   Orders Placed This Encounter  Procedures   CT Chest W Contrast    Standing Status:   Future    Standing Expiration Date:   10/07/2022    Order Specific Question:   If indicated for the ordered procedure, I authorize the administration of contrast media per Radiology protocol    Answer:   Yes    Order Specific Question:   Preferred imaging location?    Answer:   [facility]   CT Abdomen Pelvis W Contrast    Standing Status:   Future    Standing Expiration Date:   10/07/2022    Order Specific Question:   If indicated for the ordered procedure, I authorize the administration of contrast media per Radiology protocol    Answer:   Yes    Order Specific Question:   Preferred imaging location?    Answer:   [facility]    Order Specific Question:   Is Oral Contrast requested for this exam?    Answer:   Yes, Per Radiology protocol       Val Schiavo L Dhamar Gregory, PA-C 10/07/21  ADDENDUM: Hematology/Oncology Attending: I had a face-to-face encounter with the patient today.  I reviewed his record, lab and recommended his care plan.  This is a very pleasant 75 years old white male with stage IV non-small cell lung cancer favoring adenocarcinoma presented with left lung mass in addition to left hilar adenopathy and malignant left pleural effusion as well as intrathoracic and upper abdominal lymphadenopathy as well as left pleural subpleural nodularity and hepatic metastasis diagnosed in May 2022 with no actionable mutation and PD-L1 expression of 30%. The patient was treated with palliative radiotherapy to the obstructive left lung mass followed by systemic chemotherapy with induction carboplatin, Alimta and Keytruda initially for 4 cycles followed by 2 cycles of maintenance treatment with Alimta and Keytruda but his treatment has been on hold since October 2022 secondary to respiratory complication with questionable pneumonia as well as immunotherapy mediated pneumonitis.  The patient was treated with a tapered dose of prednisone over several weeks.  He also underwent inpatient rehabilitation and feeling much better. I had a lengthy discussion with the patient and his wife today about his current condition and treatment options. I recommended for the patient to have repeat CT scan of the chest, abdomen and pelvis in 1 week for further evaluation of his disease before making a decision regarding the next step in his treatment. I doubt we will consider The patient for treatment with immunotherapy again taking into consideration the significant immunotherapy mediated pneumonitis he had with Keytruda. The patient will continue with the physical therapy for now for his weakness. For the cough, he will continue his current treatment with Tessalon. The patient will come back for follow-up visit in around 2 weeks for reevaluation and more  detailed discussion of his treatment options. He was advised to call immediately if he has any other concerning symptoms in the interval.  Disclaimer: This note was dictated with voice recognition software. Similar sounding words can inadvertently be transcribed and may be missed upon review. Eilleen Kempf, MD 10/07/21

## 2021-09-30 ENCOUNTER — Encounter (HOSPITAL_COMMUNITY): Payer: Self-pay

## 2021-09-30 ENCOUNTER — Ambulatory Visit (HOSPITAL_COMMUNITY): Payer: Medicare PPO | Admitting: Physician Assistant

## 2021-09-30 NOTE — Progress Notes (Incomplete)
Primary Care Physician: [name], MD Primary Cardiologist: Dr Acie Fredrickson Primary Electrophysiologist: Dr Rayann Heman Referring Physician: Dr Deatra Canter is a 75 y.o. male with a history of CAD, HTN, OSA, CVA, paroxysmal atrial fibrillation, and HLD who presents for follow up in the [facility].  The patient was initially diagnosed with atrial fibrillation in 2016 after presenting with symptoms of presyncope. He converted on IV diltiazem and has been maintained on Xarelto. Patient had done well for years until 07/06/19 when he presented to the ER with symptoms of palpitations and chest pressure and found to be in afib vs atrial flutter with RVR. He converted while being evaluated in the ER and his symptoms resolved. Since then, he has not had any further symptoms. He denies any specific triggers that he could identify. He has a diagnosis of OSA and is compliant with his CPAP. He denies significant alcohol use. He is on Xarelto for a CHADS2VASC score of 5. Patient had another episode of afib on 09/07/19. There were no specific triggers that the patient could identify. He took the PRN diltiazem without much relief. He presented to the ER in afib with RVR and underwent successful DCCV. Patient is s/p dofetilide loading 12/15-12/18/20. Patient had recurrent episodes of blurred vision and "foggy headedness". Neurologic workup was unremarkable. He underwent ILR implantation with Dr Rayann Heman 02/27/20.  Patient presented to the ED 02/24/21 with increasing SOB. Imaging showed a left pleural effusion with a lung mass questionable for malignancy. 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   On follow up today, ***prolonged hospital stay October and November.   Today, he denies symptoms of ***palpitations, chest pain, orthopnea, PND, lower extremity edema, presyncope, syncope, bleeding, or neurologic sequela. The patient is tolerating medications without  difficulties and is otherwise without complaint today.    Atrial Fibrillation Risk Factors:  he does have symptoms or diagnosis of sleep apnea. he is compliant with CPAP therapy. he does not have a history of rheumatic fever. he does not have a history of alcohol use. The patient does not have a history of early familial atrial fibrillation or other arrhythmias.  he has a BMI of There is no height or weight on file to calculate BMI.. There were no vitals filed for this visit.    Family History  Problem Relation Age of Onset   Hypertension Mother    Heart disease Father    Heart attack Father    Prostate cancer Brother 71   Parkinson's disease Brother    Lung cancer Paternal Aunt        hx of smoking   Breast cancer Paternal Grandmother        dx 77s, bilateral mastectomies   Heart attack Paternal Grandfather    Blindness Son    Colon cancer Neg Hx    Pancreatic cancer Neg Hx      Atrial Fibrillation Management history:  Previous antiarrhythmic drugs: dofetilide Previous cardioversions: 09/07/19 Previous ablations: none CHADS2VASC score: 5 Anticoagulation history: Xarelto    Past Medical History:  Diagnosis Date   Anemia    Anxiety    Arthritis    "back, right knee, hands, ankles, neck" (05/31/2016)   Carotid artery disease ([place]) 08/30/2016   Carotid US 11/19: R 1-39, L 40-59 // Carotid US 08/2019: R 1-39; L 40-59 // Carotid US 11/21: R 1-39; L 40-59>> repeat 1 year    Chronic lower back pain  Coronary atherosclerosis of native coronary artery    a. BMS to [facility] 2004 and 2007, otherwise mild nonobstructive disease. EF normal.   Diverticulitis    Dyslipidemia    Dyspnea    Essential hypertension, benign    Family history of breast cancer    Family history of lung cancer    Family history of prostate cancer    GERD (gastroesophageal reflux disease)    Lumbar radiculopathy, chronic 02/04/2015   Right L5   Obesity    OSA on CPAP    uses cpap   Paroxysmal  atrial fibrillation ([place])    a. Discovered after stroke.   Pneumonia 01/1996   PONV (postoperative nausea and vomiting)    Prostate CA ([place])    Recurrent upper respiratory infection (URI)    Stroke ([place]) 07/2012   no deficits   Visit for monitoring Tikosyn therapy 09/18/2019   Past Surgical History:  Procedure Laterality Date   ADENOIDECTOMY     ANTERIOR CERVICAL DECOMP/DISCECTOMY FUSION  07/2001; 10/2002   "C5-6; C6-7; redo"   BACK SURGERY     CARPAL TUNNEL RELEASE Left 10/2015   CHEST TUBE INSERTION Left 03/17/2021   Procedure: INSERTION PLEURAL DRAINAGE CATHETER;  Surgeon: Garner Nash, DO;  Location: [place];  Service: Pulmonary;  Laterality: Left;  Indwelling Tunneled Pleural catheter (PLEUREX)    COLONOSCOPY W/ POLYPECTOMY  02/2014   CORONARY ANGIOPLASTY WITH STENT PLACEMENT  05/2003; 12/2005   "mid RCA; mid RCA"   FINE NEEDLE ASPIRATION  02/26/2021   Procedure: FINE NEEDLE ASPIRATION (FNA) LINEAR;  Surgeon: Candee Furbish, MD;  Location[facility] ENDOSCOPY;  Service: Pulmonary;;   HAND SURGERY  02/2019   LEFT HAND   implantable loop recorder placement  02/27/2020   Medtronic Reveal [place] model [id number]  implantable loop recorder implanted by Dr Rayann Heman for afib management and evaluation of presyncope   IR IMAGING GUIDED PORT INSERTION  04/16/2021   JOINT REPLACEMENT     KNEE ARTHROSCOPY Left 10/2005   KNEE ARTHROSCOPY W/ PARTIAL MEDIAL MENISCECTOMY Left 09/2005   LUMBAR LAMINECTOMY/DECOMPRESSION MICRODISCECTOMY  03/2005   "L4-5"   POSTERIOR LUMBAR FUSION  10/2003   L5-S1; "plates, screws"   SHOULDER ARTHROSCOPY Right 08/2011   Debridement of labrum, arthroscopic distal clavicle excision   SHOULDER OPEN ROTATOR CUFF REPAIR Left 07/2014   TEE WITHOUT CARDIOVERSION  07/07/2012   Procedure: TRANSESOPHAGEAL ECHOCARDIOGRAM (TEE);  Surgeon: Fay Records, MD;  Location: [facility] ENDOSCOPY;  Service: Cardiovascular;  Laterality: N/A;   THORACENTESIS N/A 02/25/2021   Procedure:  Mathews Robinsons;  Surgeon: Juanito Doom, MD;  Location: [place];  Service: Cardiopulmonary;  Laterality: N/A;   TONSILLECTOMY AND ADENOIDECTOMY  ~ 1956   TOTAL KNEE ARTHROPLASTY Left 10/2006   TRIGGER FINGER RELEASE Left 10/2015   VIDEO BRONCHOSCOPY WITH ENDOBRONCHIAL ULTRASOUND Left 02/26/2021   Procedure: VIDEO BRONCHOSCOPY WITH ENDOBRONCHIAL ULTRASOUND;  Surgeon: Candee Furbish, MD;  Location[facility] ENDOSCOPY;  Service: Pulmonary;  Laterality: Left;  cryoprobe too thanks!    Current Outpatient Medications  Medication Sig Dispense Refill   acetaminophen (TYLENOL) 325 MG tablet Take 2 tablets (650 mg total) by mouth every 6 (six) hours as needed for mild pain (or Fever >/= 101).     albuterol (PROVENTIL) (2.5 MG/3ML) 0.083% nebulizer solution Inhale 3 mLs into the lungs every 4 (four) hours as needed for wheezing or shortness of breath. 75 mL 12   ALPRAZolam (XANAX) 0.5 MG tablet Take 1 tablet (0.5 mg total) by mouth  at bedtime as needed for anxiety. 10 tablet 0   atorvastatin (LIPITOR) 40 MG tablet Take 1 tablet (40 mg total) by mouth daily. 90 tablet 3   benzonatate (TESSALON) 200 MG capsule Take 1 capsule (200 mg total) by mouth 3 (three) times daily. 90 capsule 0   budesonide (PULMICORT) 0.5 MG/2ML nebulizer solution Take 2 mLs (0.5 mg total) by nebulization 2 (two) times daily. 60 mL 12   carbidopa-levodopa (SINEMET IR) 25-100 MG tablet Take 1 tablet by mouth 3 (three) times daily. (Patient taking differently: Take 1 tablet by mouth 3 (three) times daily. 0800, 1200, 1600) 270 tablet 1   chlorpheniramine-HYDROcodone (TUSSIONEX) 10-8 MG/5ML SUER Take 5 mLs by mouth every 12 (twelve) hours as needed for cough. 473 mL 0   dapsone 100 MG tablet Take 1 tablet (100 mg total) by mouth daily. 30 tablet 0   dofetilide (TIKOSYN) 500 MCG capsule Take 1 capsule (500 mcg total) by mouth 2 (two) times daily. Please keep upcoming appointment in February for future refills. Thank you 180 capsule 3    fenofibrate 160 MG tablet TAKE 1 TABLET DAILY. PLEASE KEEP UPCOMING APPT IN MARCH WITH DR. Acie Fredrickson BEFORE ANYMORE REFILLS. 90 tablet 3   fluticasone (FLONASE) 50 MCG/ACT nasal spray Place 2 sprays into both nostrils daily. 08.1 mL 5   folic acid (FOLVITE) 1 MG tablet Take 1 tablet (1 mg total) by mouth daily. 90 tablet 1   guaiFENesin (MUCINEX) 600 MG 12 hr tablet Take 2 tablets (1,200 mg total) by mouth 2 (two) times daily.     ipratropium (ATROVENT) 0.02 % nebulizer solution Take 2.5 mLs (0.5 mg total) by nebulization 2 (two) times daily. 75 mL 12   ipratropium (ATROVENT) 0.03 % nasal spray Place 1-2 sprays into both nostrils every 12 (twelve) hours. For drainage 30 mL 5   levalbuterol (XOPENEX) 0.63 MG/3ML nebulizer solution Take 3 mLs (0.63 mg total) by nebulization 2 (two) times daily. 3 mL 12   loratadine (CLARITIN) 10 MG tablet Take 1 tablet (10 mg total) by mouth daily as needed for allergies (nasal drainage). 30 tablet 0   magnesium oxide (MAG-OX) 400 (240 Mg) MG tablet Take 1 tablet (400 mg total) by mouth daily. 90 tablet 2   metoprolol succinate (TOPROL-XL) 25 MG 24 hr tablet Take 1 tablet (25 mg total) by mouth daily. 90 tablet 1   Multiple Vitamins-Minerals (MULTIVITAMINS THER. W/MINERALS) TABS Take 1 tablet by mouth daily.     nitroGLYCERIN (NITROSTAT) 0.4 MG SL tablet Place 1 tablet (0.4 mg total) under the tongue every 5 (five) minutes as needed for chest pain. 25 tablet 5   omeprazole (PRILOSEC) 20 MG capsule Take 1 capsule (20 mg total) by mouth daily. 30 capsule 5   polyethylene glycol (MIRALAX / GLYCOLAX) 17 g packet Take 17 g by mouth daily. 14 each 0   potassium chloride (KLOR-CON) 10 MEQ tablet TAKE 1 TABLET BY MOUTH EVERY DAY (Patient taking differently: Take 10 mEq by mouth daily.) 90 tablet 1   potassium chloride (KLOR-CON) 10 MEQ tablet Take 1 tablet (10 mEq total) by mouth daily. 30 tablet 0   predniSONE (DELTASONE) 10 MG tablet 6 tablets daily x6 days then 4 tablets daily  x7 days then 2 tablets daily x7 days then 1 tab daily 120 tablet 0   rivaroxaban (XARELTO) 20 MG TABS tablet Take 1 tablet (20 mg total) by mouth daily. 90 tablet 2   Saccharomyces boulardii (PROBIOTIC) 250 MG CAPS Take 250 mg  by mouth daily.     senna-docusate (SENOKOT-S) 8.6-50 MG tablet Take 1 tablet by mouth 2 (two) times daily.     tamsulosin (FLOMAX) 0.4 MG CAPS capsule Take 1 capsule (0.4 mg total) by mouth daily after supper. 30 capsule 0   Tiotropium Bromide-Olodaterol (STIOLTO RESPIMAT) 2.5-2.5 MCG/ACT AERS Inhale 2 puffs into the lungs daily. 4 g 5   topiramate (TOPAMAX) 50 MG tablet Take 2 tablets (100 mg total) by mouth 2 (two) times daily. 360 tablet 3   traMADol (ULTRAM) 50 MG tablet Take 1 tablet (50 mg total) by mouth every 6 (six) hours as needed for moderate pain. 30 tablet 0   traZODone (DESYREL) 50 MG tablet Take 1 tablet (50 mg total) by mouth at bedtime. 30 tablet 0   Vitamin D, Ergocalciferol, (DRISDOL) 1.25 MG (50000 UNIT) CAPS capsule TAKE 1 CAPSULE BY MOUTH ONE TIME PER WEEK (Patient taking differently: Take 50,000 Units by mouth every 7 (seven) days. Monday) 12 capsule 1   No current facility-administered medications for this visit.    Allergies  Allergen Reactions   Amoxicillin Swelling    Tolerates Rocephin   Fosinopril Other (See Comments)   Hydromorphone Nausea Only, Other (See Comments) and Nausea And Vomiting   Ampicillin Rash   Monopril [Fosinopril Sodium] Other (See Comments)    Muscle aches & pain   Other Other (See Comments)   Rosuvastatin Other (See Comments)    Joint pain    Social History   Socioeconomic History   Marital status: Married    Spouse name: Renee   Number of children: 3   Years of education: 14   Highest education level: Not on file  Occupational History   Occupation: Retired    Fish farm manager: DISABLED     Comment: Works at [employer] part time  Tobacco Use   Smoking status: Former    Packs/day: 1.00    Years: 34.00    Pack  years: 34.00    Types: Cigarettes    Quit date: 05/05/2003    Years since quitting: 18.4    Passive exposure: Never   Smokeless tobacco: Never  Vaping Use   Vaping Use: Never used  Substance and Sexual Activity   Alcohol use: No    Alcohol/week: 0.0 standard drinks   Drug use: No   Sexual activity: Not Currently  Other Topics Concern   Not on file  Social History Narrative   Patient lives at home with spouse.   Caffeine Use:    Social Determinants of Health   Financial Resource Strain: Not on file  Food Insecurity: Not on file  Transportation Needs: Not on file  Physical Activity: Not on file  Stress: Not on file  Social Connections: Not on file  Intimate Partner Violence: Not on file     ROS- All systems are reviewed and negative except as per the HPI above.  Physical Exam: There were no vitals filed for this visit.  GEN- The patient is a well appearing *** {Desc; male/male:[id number]}, alert and oriented x 3 today.   HEENT-head normocephalic, atraumatic, sclera clear, conjunctiva pink, hearing intact, trachea midline. Lungs- Clear to ausculation bilaterally, normal work of breathing Heart- ***Regular rate and rhythm, no murmurs, rubs or gallops  GI- soft, NT, ND, + BS Extremities- no clubbing, cyanosis, or edema MS- no significant deformity or atrophy Skin- no rash or lesion Psych- euthymic mood, full affect Neuro- strength and sensation are intact   Wt Readings from Last 3 Encounters:  08/29/21  99.1 kg  08/12/21 96.2 kg  07/18/21 108 kg    EKG today demonstrates  ***  Echo 08/04/21 demonstrated   1. Left ventricular ejection fraction, by estimation, is 65 to 70%. The  left ventricle has normal function. The left ventricle has no regional  wall motion abnormalities. There is mild left ventricular hypertrophy.  Left ventricular diastolic parameters are indeterminate.   2. Right ventricular systolic function mild to moderately reduced. The right ventricular  size is mildly enlarged. There is normal pulmonary artery systolic pressure.   3. Trivial mitral valve regurgitation.   4. The aortic valve is tricuspid. Aortic valve regurgitation is not  visualized. Mild aortic valve sclerosis is present, with no evidence of  aortic valve stenosis.   5. Aortic dilatation noted. There is mild dilatation of the ascending  aorta, measuring 40 mm.   6. The inferior vena cava is normal in size with greater than 50%  respiratory variability, suggesting right atrial pressure of 3 mmHg.   Epic records are reviewed at length today  Assessment and Plan:  1. Paroxysmal atrial fibrillation S/p dofetilide loading 12/15-12/18/20. ILR on last remote check showed no new afib episodes.  *** Continue dofetilide 500 mcg BID. QT stable. *** Check bmet/mag Continue Xarelto 20 mg daily.  Continue Toprol 25 mg daily Continue diltiazem 30 mg PRN q4hrs for heart racing.  This patients CHA2DS2-VASc Score and unadjusted Ischemic Stroke Rate (% per year) is equal to 7.2 % stroke rate/year from a score of 5  Above score calculated as 1 point each if present [CHF, HTN, DM, Vascular=MI/PAD/Aortic Plaque, Age if 65-74, or Male] Above score calculated as 2 points each if present [Age > 75, or Stroke/TIA/TE]  2. Obesity There is no height or weight on file to calculate BMI. Lifestyle modification was discussed and encouraged including regular physical activity and weight reduction. ***  3. Obstructive sleep apnea Patient reports compliance with CPAP therapy. ***  4. CAD Normal stress myoveiw 10/17/19 No anginal symptoms. ***  5. HTN Stable, no changes today. ***  6. Stage IV lung cancer Plans per oncology.   Follow up ***   Adline Peals PA-C [facility] [address] [place], [place] [address] [phone] 09/30/2021 10:22 AM

## 2021-10-05 ENCOUNTER — Other Ambulatory Visit: Payer: Self-pay | Admitting: Hematology

## 2021-10-06 ENCOUNTER — Ambulatory Visit: Payer: Medicare PPO | Admitting: Nurse Practitioner

## 2021-10-07 ENCOUNTER — Inpatient Hospital Stay: Payer: Medicare PPO | Attending: Physician Assistant | Admitting: Physician Assistant

## 2021-10-07 ENCOUNTER — Inpatient Hospital Stay: Payer: Medicare PPO

## 2021-10-07 ENCOUNTER — Encounter: Payer: Self-pay | Admitting: Physician Assistant

## 2021-10-07 ENCOUNTER — Telehealth: Payer: Self-pay

## 2021-10-07 ENCOUNTER — Other Ambulatory Visit: Payer: Self-pay

## 2021-10-07 VITALS — BP 124/77 | HR 103 | Temp 97.5°F | Resp 18 | Ht 73.5 in | Wt 224.1 lb

## 2021-10-07 DIAGNOSIS — Z7901 Long term (current) use of anticoagulants: Secondary | ICD-10-CM | POA: Diagnosis not present

## 2021-10-07 DIAGNOSIS — J910 Malignant pleural effusion: Secondary | ICD-10-CM | POA: Insufficient documentation

## 2021-10-07 DIAGNOSIS — C3492 Malignant neoplasm of unspecified part of left bronchus or lung: Secondary | ICD-10-CM

## 2021-10-07 DIAGNOSIS — Z79899 Other long term (current) drug therapy: Secondary | ICD-10-CM | POA: Insufficient documentation

## 2021-10-07 DIAGNOSIS — D649 Anemia, unspecified: Secondary | ICD-10-CM

## 2021-10-07 DIAGNOSIS — C3412 Malignant neoplasm of upper lobe, left bronchus or lung: Secondary | ICD-10-CM | POA: Diagnosis not present

## 2021-10-07 DIAGNOSIS — Z5111 Encounter for antineoplastic chemotherapy: Secondary | ICD-10-CM | POA: Diagnosis not present

## 2021-10-07 DIAGNOSIS — C778 Secondary and unspecified malignant neoplasm of lymph nodes of multiple regions: Secondary | ICD-10-CM | POA: Insufficient documentation

## 2021-10-07 DIAGNOSIS — C787 Secondary malignant neoplasm of liver and intrahepatic bile duct: Secondary | ICD-10-CM | POA: Diagnosis not present

## 2021-10-07 DIAGNOSIS — Z95828 Presence of other vascular implants and grafts: Secondary | ICD-10-CM

## 2021-10-07 LAB — CBC WITH DIFFERENTIAL (CANCER CENTER ONLY)
Abs Immature Granulocytes: 0.25 10*3/uL — ABNORMAL HIGH (ref 0.00–0.07)
Basophils Absolute: 0.1 10*3/uL (ref 0.0–0.1)
Basophils Relative: 1 %
Eosinophils Absolute: 0.1 10*3/uL (ref 0.0–0.5)
Eosinophils Relative: 1 %
HCT: 30 % — ABNORMAL LOW (ref 39.0–52.0)
Hemoglobin: 9.4 g/dL — ABNORMAL LOW (ref 13.0–17.0)
Immature Granulocytes: 4 %
Lymphocytes Relative: 8 %
Lymphs Abs: 0.6 10*3/uL — ABNORMAL LOW (ref 0.7–4.0)
MCH: 34.7 pg — ABNORMAL HIGH (ref 26.0–34.0)
MCHC: 31.3 g/dL (ref 30.0–36.0)
MCV: 110.7 fL — ABNORMAL HIGH (ref 80.0–100.0)
Monocytes Absolute: 0.7 10*3/uL (ref 0.1–1.0)
Monocytes Relative: 9 %
Neutro Abs: 5.3 10*3/uL (ref 1.7–7.7)
Neutrophils Relative %: 77 %
Platelet Count: 245 10*3/uL (ref 150–400)
RBC: 2.71 MIL/uL — ABNORMAL LOW (ref 4.22–5.81)
RDW: 15.6 % — ABNORMAL HIGH (ref 11.5–15.5)
WBC Count: 6.9 10*3/uL (ref 4.0–10.5)
nRBC: 0 % (ref 0.0–0.2)

## 2021-10-07 LAB — CMP (CANCER CENTER ONLY)
ALT: 10 U/L (ref 0–44)
AST: 20 U/L (ref 15–41)
Albumin: 3.1 g/dL — ABNORMAL LOW (ref 3.5–5.0)
Alkaline Phosphatase: 53 U/L (ref 38–126)
Anion gap: 6 (ref 5–15)
BUN: 25 mg/dL — ABNORMAL HIGH (ref 8–23)
CO2: 25 mmol/L (ref 22–32)
Calcium: 8.9 mg/dL (ref 8.9–10.3)
Chloride: 105 mmol/L (ref 98–111)
Creatinine: 0.77 mg/dL (ref 0.61–1.24)
GFR, Estimated: >60 mL/min (ref >60)
Glucose, Bld: 133 mg/dL — ABNORMAL HIGH (ref 70–99)
Potassium: 4.2 mmol/L (ref 3.5–5.1)
Sodium: 136 mmol/L (ref 135–145)
Total Bilirubin: 0.4 mg/dL (ref 0.3–1.2)
Total Protein: 6.6 g/dL (ref 6.5–8.1)

## 2021-10-07 LAB — SAMPLE TO BLOOD BANK

## 2021-10-07 MED ORDER — HEPARIN SOD (PORK) LOCK FLUSH 100 UNIT/ML IV SOLN
500.0000 [IU] | Freq: Once | INTRAVENOUS | Status: AC
  Administered 2021-10-07: 500 [IU]

## 2021-10-07 MED ORDER — SODIUM CHLORIDE 0.9% FLUSH
10.0000 mL | Freq: Once | INTRAVENOUS | Status: AC
  Administered 2021-10-07: 10 mL

## 2021-10-07 MED ORDER — BENZONATATE 200 MG PO CAPS: 200.0000 mg | ORAL_CAPSULE | Freq: Three times a day (TID) | ORAL | 0 refills | Status: AC

## 2021-10-07 NOTE — Telephone Encounter (Signed)
Pt presented to the office requesting his PULM provider please order him a portable O2 machine for use to travel to appointments and the grocery store. Pt states he has received the in-home unit but does not have the small unit for travel.

## 2021-10-08 LAB — TSH: TSH: 0.618 u[IU]/mL (ref 0.320–4.118)

## 2021-10-08 NOTE — Addendum Note (Signed)
Addended by: Claudette Head A on: 10/08/2021 09:13 AM   Modules accepted: Orders

## 2021-10-08 NOTE — Telephone Encounter (Signed)
Spoke to patient. He stated that he was started on 4L at discharge. Order placed to [facility] for best fit POC.  Patient is aware and voiced his understanding.  Nothing further needed at this time.

## 2021-10-15 ENCOUNTER — Other Ambulatory Visit: Payer: Self-pay

## 2021-10-15 ENCOUNTER — Ambulatory Visit (HOSPITAL_COMMUNITY)
Admission: RE | Admit: 2021-10-15 | Discharge: 2021-10-15 | Disposition: A | Discharge status: Home / Self Care | Payer: Medicare PPO | Source: Ambulatory Visit | Attending: Physician Assistant | Admitting: Physician Assistant

## 2021-10-15 DIAGNOSIS — I700 Atherosclerosis of aorta: Secondary | ICD-10-CM | POA: Diagnosis not present

## 2021-10-15 DIAGNOSIS — C3492 Malignant neoplasm of unspecified part of left bronchus or lung: Secondary | ICD-10-CM | POA: Diagnosis not present

## 2021-10-15 DIAGNOSIS — C3490 Malignant neoplasm of unspecified part of unspecified bronchus or lung: Secondary | ICD-10-CM | POA: Diagnosis not present

## 2021-10-15 DIAGNOSIS — K7689 Other specified diseases of liver: Secondary | ICD-10-CM | POA: Diagnosis not present

## 2021-10-15 DIAGNOSIS — C787 Secondary malignant neoplasm of liver and intrahepatic bile duct: Secondary | ICD-10-CM | POA: Diagnosis not present

## 2021-10-15 MED ORDER — HEPARIN SOD (PORK) LOCK FLUSH 100 UNIT/ML IV SOLN
INTRAVENOUS | Status: AC
  Filled 2021-10-15: qty 5

## 2021-10-15 MED ORDER — IOHEXOL 300 MG/ML  SOLN
100.0000 mL | Freq: Once | INTRAMUSCULAR | Status: AC | PRN
  Administered 2021-10-15: 100 mL via INTRAVENOUS

## 2021-10-15 MED ORDER — HEPARIN SOD (PORK) LOCK FLUSH 100 UNIT/ML IV SOLN
500.0000 [IU] | Freq: Once | INTRAVENOUS | Status: AC
Start: 2021-10-15 — End: 2021-10-15
  Administered 2021-10-15: 500 [IU] via INTRAVENOUS

## 2021-10-16 ENCOUNTER — Telehealth: Payer: Self-pay | Admitting: Pulmonary Disease

## 2021-10-16 NOTE — Telephone Encounter (Signed)
Called and spoke with patient about order for portable oxygen. Advised him that the original message we received about oxygen was from Oncology and when we responded and placed the order it went under oncology and not pulmonary. Apologized for the delay and that that happened. Patient was very understanding. Message sent to [facility] and Vallarie Mare fixed the order and also called [employer] and they said they are going to take care of it. Patient is aware. Nothing further needed at this time.

## 2021-10-20 ENCOUNTER — Encounter: Payer: Self-pay | Admitting: Internal Medicine

## 2021-10-20 ENCOUNTER — Other Ambulatory Visit: Payer: Self-pay

## 2021-10-20 ENCOUNTER — Inpatient Hospital Stay: Payer: Medicare PPO | Admitting: Internal Medicine

## 2021-10-20 ENCOUNTER — Inpatient Hospital Stay: Payer: Medicare PPO

## 2021-10-20 VITALS — BP 131/83 | HR 103 | Temp 96.2°F | Resp 21 | Ht 73.5 in | Wt 229.1 lb

## 2021-10-20 DIAGNOSIS — Z5111 Encounter for antineoplastic chemotherapy: Secondary | ICD-10-CM | POA: Diagnosis not present

## 2021-10-20 DIAGNOSIS — J910 Malignant pleural effusion: Secondary | ICD-10-CM | POA: Diagnosis not present

## 2021-10-20 DIAGNOSIS — Z79899 Other long term (current) drug therapy: Secondary | ICD-10-CM | POA: Diagnosis not present

## 2021-10-20 DIAGNOSIS — C3492 Malignant neoplasm of unspecified part of left bronchus or lung: Secondary | ICD-10-CM

## 2021-10-20 DIAGNOSIS — Z7901 Long term (current) use of anticoagulants: Secondary | ICD-10-CM | POA: Diagnosis not present

## 2021-10-20 DIAGNOSIS — C787 Secondary malignant neoplasm of liver and intrahepatic bile duct: Secondary | ICD-10-CM | POA: Diagnosis not present

## 2021-10-20 DIAGNOSIS — C3432 Malignant neoplasm of lower lobe, left bronchus or lung: Secondary | ICD-10-CM

## 2021-10-20 DIAGNOSIS — D649 Anemia, unspecified: Secondary | ICD-10-CM

## 2021-10-20 DIAGNOSIS — C778 Secondary and unspecified malignant neoplasm of lymph nodes of multiple regions: Secondary | ICD-10-CM | POA: Diagnosis not present

## 2021-10-20 DIAGNOSIS — C3412 Malignant neoplasm of upper lobe, left bronchus or lung: Secondary | ICD-10-CM | POA: Diagnosis not present

## 2021-10-20 LAB — CBC WITH DIFFERENTIAL (CANCER CENTER ONLY)
Abs Immature Granulocytes: 0.2 10*3/uL — ABNORMAL HIGH (ref 0.00–0.07)
Basophils Absolute: 0.1 10*3/uL (ref 0.0–0.1)
Basophils Relative: 1 %
Eosinophils Absolute: 0.2 10*3/uL (ref 0.0–0.5)
Eosinophils Relative: 2 %
HCT: 32 % — ABNORMAL LOW (ref 39.0–52.0)
Hemoglobin: 10.5 g/dL — ABNORMAL LOW (ref 13.0–17.0)
Immature Granulocytes: 2 %
Lymphocytes Relative: 5 %
Lymphs Abs: 0.5 10*3/uL — ABNORMAL LOW (ref 0.7–4.0)
MCH: 34.9 pg — ABNORMAL HIGH (ref 26.0–34.0)
MCHC: 32.8 g/dL (ref 30.0–36.0)
MCV: 106.3 fL — ABNORMAL HIGH (ref 80.0–100.0)
Monocytes Absolute: 0.9 10*3/uL (ref 0.1–1.0)
Monocytes Relative: 9 %
Neutro Abs: 7.5 10*3/uL (ref 1.7–7.7)
Neutrophils Relative %: 81 %
Platelet Count: 294 10*3/uL (ref 150–400)
RBC: 3.01 MIL/uL — ABNORMAL LOW (ref 4.22–5.81)
RDW: 14.9 % (ref 11.5–15.5)
WBC Count: 9.3 10*3/uL (ref 4.0–10.5)
nRBC: 0 % (ref 0.0–0.2)

## 2021-10-20 LAB — CMP (CANCER CENTER ONLY)
ALT: 8 U/L (ref 0–44)
AST: 18 U/L (ref 15–41)
Albumin: 3.5 g/dL (ref 3.5–5.0)
Alkaline Phosphatase: 60 U/L (ref 38–126)
Anion gap: 8 (ref 5–15)
BUN: 18 mg/dL (ref 8–23)
CO2: 23 mmol/L (ref 22–32)
Calcium: 9.5 mg/dL (ref 8.9–10.3)
Chloride: 105 mmol/L (ref 98–111)
Creatinine: 0.76 mg/dL (ref 0.61–1.24)
GFR, Estimated: >60 mL/min (ref >60)
Glucose, Bld: 110 mg/dL — ABNORMAL HIGH (ref 70–99)
Potassium: 4 mmol/L (ref 3.5–5.1)
Sodium: 136 mmol/L (ref 135–145)
Total Bilirubin: 0.4 mg/dL (ref 0.3–1.2)
Total Protein: 6.7 g/dL (ref 6.5–8.1)

## 2021-10-20 LAB — SAMPLE TO BLOOD BANK

## 2021-10-20 LAB — TSH: TSH: 1.263 u[IU]/mL (ref 0.320–4.118)

## 2021-10-20 NOTE — Progress Notes (Signed)
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     Molecular Studies by [employer]: [id number] 0.5%   Olaparib Yes [id number] 50.9%   Olaparib Yes [id number] 0.8% None      PDL1: 30%   PRIOR THERAPY: 1) Palliative radiotherapy to the obstructive lung mass in the left lung under the care of Dr. MTammi Klippel Last treatment 03/23/21   CURRENT THERAPY: Palliative systemic chemotherapy with carboplatin AUC 5, Alimta 500 mg per metered squared, Keytruda 200 mg IV every 3 weeks.  First dose on 03/31/21. Status post 6 cycles, last dose was given July 14, 2021.  Starting from cycle #5 the patient is on maintenance treatment with Alimta 500 Mg/M2 and Keytruda 200 Mg IV every 3 weeks.  The patient has been off treatment between July 14, 2021 and he would resume treatment with single agent Alimta on October 27, 2021.  His treatment was on hold secondary to significant immunotherapy mediated pneumonitis and hospitalization.  INTERVAL HISTORY: Corey KLEINSASSER89y.o. male returns to the clinic today for follow-up visit accompanied by his wife.  The patient is feeling fine today with no concerning complaints except for the baseline shortness of breath and generalized weakness.  He has been undergoing physical therapy and rehabilitation but he lost few weeks.  He is getting little bit stronger.  He denied having any current chest pain or hemoptysis.  He has no nausea, vomiting, diarrhea or constipation.  He has no headache or visual changes.  He has no fever or chills.   He has no significant weight loss or night sweats.  The patient had repeat CT scan of the chest, abdomen pelvis performed recently and he is here for evaluation and discussion of his scan results.   MEDICAL HISTORY: Past Medical History:  Diagnosis Date   Anemia    Anxiety    Arthritis    "back, right knee, hands, ankles, neck" (05/31/2016)   [place] artery disease [place] 08/30[place]   Carotid [place]: R 1-39, L 40-59 // Carotid [place]: R 1-39; L 40-59 // Carotid [place]: R 1-39; L 40-59>> repeat 1 year    Chronic lower back pain    Coronary atherosclerosis of native coronary artery    [facility], otherwise mild nonobstructive disease. EF normal.   Diverticulitis    Dyslipidemia    Dyspnea    Essential hypertension, benign    Family history of breast cancer    Family history of lung cancer    Family history of prostate cancer    GERD (gastroesophageal reflux disease)    Lumbar radiculopathy, chronic 02/04/2015   Right L5   Obesity    OSA on CPAP    uses cpap   Paroxysmal atrial fibrillation ([place]    a. Discovered after stroke.   Pneumonia 01/1996   PONV (postoperative nausea and vomiting)    Prostate CA ([place]    Recurrent upper respiratory infection (URI)    [place] 07[place]   no deficits   Visit for monitoring Tikosyn therapy 09/18/2019    ALLERGIES:  is  allergic to amoxicillin, fosinopril, hydromorphone, ampicillin, codeine, monopril [fosinopril sodium], other, and rosuvastatin.  MEDICATIONS:  Current Outpatient Medications  Medication Sig Dispense Refill   acetaminophen (TYLENOL) 325 MG tablet Take 2 tablets (650 mg total) by mouth every 6 (six) hours as needed for mild pain (or Fever >/= 101).     albuterol (PROVENTIL) (2.5 MG/3ML) 0.083% nebulizer solution Inhale 3 mLs into the lungs every 4 (four) hours as needed for wheezing or shortness of breath. 75 mL 12   ALPRAZolam (XANAX) 0.5 MG tablet Take 1 tablet (0.5 mg total) by mouth at bedtime  as needed for anxiety. 10 tablet 0   atorvastatin (LIPITOR) 40 MG tablet Take 1 tablet (40 mg total) by mouth daily. 90 tablet 3   benzonatate (TESSALON) 200 MG capsule Take 1 capsule (200 mg total) by mouth 3 (three) times daily. 90 capsule 0   budesonide (PULMICORT) 0.5 MG/2ML nebulizer solution Take 2 mLs (0.5 mg total) by nebulization 2 (two) times daily. 60 mL 12   carbidopa-levodopa (SINEMET IR) 25-100 MG tablet Take 1 tablet by mouth 3 (three) times daily. (Patient taking differently: Take 1 tablet by mouth 3 (three) times daily. 0800, 1200, 1600) 270 tablet 1   chlorpheniramine-HYDROcodone (TUSSIONEX) 10-8 MG/5ML SUER Take 5 mLs by mouth every 12 (twelve) hours as needed for cough. 473 mL 0   dofetilide (TIKOSYN) 500 MCG capsule Take 1 capsule (500 mcg total) by mouth 2 (two) times daily. Please keep upcoming appointment in February for future refills. Thank you 180 capsule 3   fenofibrate 160 MG tablet TAKE 1 TABLET DAILY. PLEASE KEEP UPCOMING APPT IN MARCH WITH DR. Acie Fredrickson BEFORE ANYMORE REFILLS. 90 tablet 3   fluticasone (FLONASE) 50 MCG/ACT nasal spray Place 2 sprays into both nostrils daily. 72.5 mL 5   folic acid (FOLVITE) 1 MG tablet Take 1 tablet (1 mg total) by mouth daily. 90 tablet 1   guaiFENesin (MUCINEX) 600 MG 12 hr tablet Take 2 tablets (1,200 mg total) by mouth 2 (two) times daily.     ipratropium (ATROVENT) 0.02 % nebulizer solution Take 2.5 mLs (0.5 mg total) by nebulization 2 (two) times daily. 75 mL 12   ipratropium (ATROVENT) 0.03 % nasal spray Place 1-2 sprays into both nostrils every 12 (twelve) hours. For drainage 30 mL 5   levalbuterol (XOPENEX) 0.63 MG/3ML nebulizer solution Take 3 mLs (0.63 mg total) by nebulization 2 (two) times daily. 3 mL 12   loratadine (CLARITIN) 10 MG tablet Take 1 tablet (10 mg total) by mouth daily as needed for allergies (nasal drainage). 30 tablet 0   magnesium oxide (MAG-OX) 400 (240 Mg) MG tablet Take 1 tablet (400 mg total) by mouth  daily. 90 tablet 2   metoprolol succinate (TOPROL-XL) 25 MG 24 hr tablet Take 1 tablet (25 mg total) by mouth daily. 90 tablet 1   Multiple Vitamins-Minerals (MULTIVITAMINS THER. W/MINERALS) TABS Take 1 tablet by mouth daily.     nitroGLYCERIN (NITROSTAT) 0.4 MG SL tablet Place 1 tablet (0.4 mg total) under the tongue every 5 (five) minutes as needed for chest pain. 25 tablet 5   omeprazole (PRILOSEC) 20 MG capsule Take 1 capsule (20 mg total) by mouth daily. 30 capsule 5   polyethylene glycol (MIRALAX / GLYCOLAX) 17 g packet Take 17 g by mouth daily. 14 each 0   potassium chloride (KLOR-CON) 10 MEQ tablet TAKE 1 TABLET BY MOUTH EVERY DAY (Patient taking differently: Take 10 mEq by mouth daily.) 90 tablet 1  rivaroxaban (XARELTO) 20 MG TABS tablet Take 1 tablet (20 mg total) by mouth daily. 90 tablet 2   Saccharomyces boulardii (PROBIOTIC) 250 MG CAPS Take 250 mg by mouth daily.     tamsulosin (FLOMAX) 0.4 MG CAPS capsule Take 1 capsule (0.4 mg total) by mouth daily after supper. 30 capsule 0   Tiotropium Bromide-Olodaterol (STIOLTO RESPIMAT) 2.5-2.5 MCG/ACT AERS Inhale 2 puffs into the lungs daily. 4 g 5   topiramate (TOPAMAX) 50 MG tablet Take 2 tablets (100 mg total) by mouth 2 (two) times daily. 360 tablet 3   traMADol (ULTRAM) 50 MG tablet Take 1 tablet (50 mg total) by mouth every 6 (six) hours as needed for moderate pain. 30 tablet 0   traZODone (DESYREL) 50 MG tablet Take 1 tablet (50 mg total) by mouth at bedtime. 30 tablet 0   Vitamin D, Ergocalciferol, (DRISDOL) 1.25 MG (50000 UNIT) CAPS capsule Take 1 capsule (50,000 Units total) by mouth every 7 (seven) days. Monday 12 capsule 0   No current facility-administered medications for this visit.    SURGICAL HISTORY:  Past Surgical History:  Procedure Laterality Date   ADENOIDECTOMY     ANTERIOR CERVICAL DECOMP/DISCECTOMY FUSION  07/2001; 10/2002   "C5-6; C6-7; redo"   BACK SURGERY     CARPAL TUNNEL RELEASE Left 10/2015   CHEST TUBE  INSERTION Left 03/17/2021   Procedure: INSERTION PLEURAL DRAINAGE CATHETER;  Surgeon: Garner Nash, DO;  Location: [facility];  Service: Pulmonary;  Laterality: Left;  Indwelling Tunneled Pleural catheter (PLEUREX)    COLONOSCOPY W/ POLYPECTOMY  02/2014   CORONARY ANGIOPLASTY WITH STENT PLACEMENT  05/2003; 12/2005   "mid RCA; mid RCA"   FINE NEEDLE ASPIRATION  02/26/2021   Procedure: FINE NEEDLE ASPIRATION (FNA) LINEAR;  Surgeon: Candee Furbish, MD;  Location[facility] ENDOSCOPY;  Service: Pulmonary;;   HAND SURGERY  02/2019   LEFT HAND   implantable loop recorder placement  02/27/2020   [employer] [place] [place] model [id number]  implantable loop recorder implanted by Dr Rayann Heman for afib management and evaluation of presyncope   IR IMAGING GUIDED PORT INSERTION  04/16/2021   JOINT REPLACEMENT     KNEE ARTHROSCOPY Left 10/2005   KNEE ARTHROSCOPY W/ PARTIAL MEDIAL MENISCECTOMY Left 09/2005   LUMBAR LAMINECTOMY/DECOMPRESSION MICRODISCECTOMY  03/2005   "L4-5"   POSTERIOR LUMBAR FUSION  10/2003   L5-S1; "plates, screws"   SHOULDER ARTHROSCOPY Right 08/2011   Debridement of labrum, arthroscopic distal clavicle excision   SHOULDER OPEN ROTATOR CUFF REPAIR Left 07/2014   TEE WITHOUT CARDIOVERSION  07/07/2012   Procedure: TRANSESOPHAGEAL ECHOCARDIOGRAM (TEE);  Surgeon: Fay Records, MD;  Location[facility] ENDOSCOPY;  Service: Cardiovascular;  Laterality: N/A;   THORACENTESIS N/A 02/25/2021   Procedure: Mathews Robinsons;  Surgeon: Juanito Doom, MD;  Location: [facility];  Service: Cardiopulmonary;  Laterality: N/A;   TONSILLECTOMY AND ADENOIDECTOMY  ~ 1956   TOTAL KNEE ARTHROPLASTY Left 10/2006   TRIGGER FINGER RELEASE Left 10/2015   VIDEO BRONCHOSCOPY WITH ENDOBRONCHIAL ULTRASOUND Left 02/26/2021   Procedure: VIDEO BRONCHOSCOPY WITH ENDOBRONCHIAL ULTRASOUND;  Surgeon: Candee Furbish, MD;  Location: [facility] ENDOSCOPY;  Service: Pulmonary;  Laterality: Left;  cryoprobe too thanks!    REVIEW OF SYSTEMS:   Constitutional: positive for fatigue Eyes: negative Ears, nose, mouth, throat, and face: negative Respiratory: positive for dyspnea on exertion Cardiovascular: negative Gastrointestinal: negative Genitourinary:negative Integument/breast: negative Hematologic/lymphatic: negative Musculoskeletal:positive for muscle weakness Neurological: negative Behavioral/Psych: negative Endocrine: negative Allergic/Immunologic: negative   PHYSICAL EXAMINATION: General  appearance: alert, cooperative, fatigued, and no distress Head: Normocephalic, without obvious abnormality, atraumatic Neck: no adenopathy, no JVD, supple, symmetrical, trachea midline, and thyroid not enlarged, symmetric, no tenderness/mass/nodules Lymph nodes: Cervical, supraclavicular, and axillary nodes normal. Resp: clear to auscultation bilaterally Back: symmetric, no curvature. ROM normal. No CVA tenderness. Cardio: regular rate and rhythm, S1, S2 normal, no murmur, click, rub or gallop GI: soft, non-tender; bowel sounds normal; no masses,  no organomegaly Extremities: extremities normal, atraumatic, no cyanosis or edema Neurologic: Alert and oriented X 3, normal strength and tone. Normal symmetric reflexes. Normal coordination and gait  ECOG PERFORMANCE STATUS: 1 - Symptomatic but completely ambulatory  Blood pressure 131/83, pulse (!) 103, temperature (!) 96.2 F (35.7 C), temperature source Tympanic, resp. rate (!) 21, height 6' 1.5" (1.867 m), weight 229 lb 1.6 oz (103.9 kg), SpO2 94 %.  LABORATORY DATA: Lab Results  Component Value Date   WBC 9.3 10/20/2021   HGB 10.5 (L) 10/20/2021   HCT 32.0 (L) 10/20/2021   MCV 106.3 (H) 10/20/2021   PLT 294 10/20/2021      Chemistry      Component Value Date/Time   NA 136 10/20/2021 1006   NA 137 01/21/2021 1553   K 4.0 10/20/2021 1006   CL 105 10/20/2021 1006   CO2 23 10/20/2021 1006   BUN 18 10/20/2021 1006   BUN 17 01/21/2021 1553   CREATININE 0.76 10/20/2021 1006    CREATININE 1.04 08/30/2016 0840      Component Value Date/Time   CALCIUM 9.5 10/20/2021 1006   ALKPHOS 60 10/20/2021 1006   AST 18 10/20/2021 1006   ALT 8 10/20/2021 1006   BILITOT 0.4 10/20/2021 1006       RADIOGRAPHIC STUDIES: CT Chest W Contrast  Result Date: 10/16/2021 CLINICAL DATA:  Primary Cancer Type: Lung Imaging Indication: Active Surveillance Interval therapy since last imaging? No Initial Cancer Diagnosis Date: 02/25/2021; Established by: Biopsy-proven Detailed Pathology: Stage IV non-small cell lung cancer, adenocarcinoma. Primary Tumor location:  Left lower lobe. Surgeries: Coronary stent. Lumbar fusion. Chemotherapy: Yes; Ongoing? No; Most recent administration: 07/04/2021 Immunotherapy?  Yes; Type: Keytruda; Ongoing? No Radiation therapy? 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 EXAM: CT CHEST, ABDOMEN, AND PELVIS WITH CONTRAST TECHNIQUE: Multidetector CT imaging of the chest, abdomen and pelvis was performed following the standard protocol during bolus administration of intravenous contrast. RADIATION DOSE REDUCTION: This exam was performed according to the departmental dose-optimization program which includes automated exposure control, adjustment of the mA and/or kV according to patient size and/or use of iterative reconstruction technique. CONTRAST:  130m OMNIPAQUE IOHEXOL 300 MG/ML  SOLN COMPARISON:  Most recent CT chest, abdomen and pelvis 07/31/2021. 03/24/2021 PET-CT. FINDINGS: CT CHEST FINDINGS Cardiovascular: Coronary artery calcification and aortic atherosclerotic calcification. Mediastinum/Nodes: Small paratracheal lymph nodes are not pathologic by size criteria and not change from prior. Esophagus normal. Trachea and Lungs/Pleura: Dense consolidation with air bronchograms in the lingula unchanged from prior. Consolidation in the LEFT lower lobe is  slightly increased (image 104/4). No discrete nodularity. Volume loss in the LEFT hemithorax. No nodularity in the RIGHT lung. Musculoskeletal: No aggressive osseous lesion. CT ABDOMEN AND PELVIS FINDINGS Hepatobiliary: Previous described lesion in the caudate lobe is increased in size measuring 4.1 x 3.2 cm compared to 2.6 x 1.5 cm. New lesion in the superior RIGHT hepatic lobe measures 2.2 cm (image 53/2). Subtle new lesion in the inferior  RIGHT hepatic lobe measures 1.4 cm on image 66/2. A subcentimeter lesion in the RIGHT hepatic lobe on image 68/2 is also suspicious Pancreas: Pancreas is normal. No ductal dilatation. No pancreatic inflammation. Spleen: Normal spleen Adrenals/urinary tract: Adrenal glands and kidneys are normal. The ureters and bladder normal. Stomach/Bowel: Stomach, small bowel, appendix, and cecum are normal. The colon and rectosigmoid colon are normal. Vascular/Lymphatic: Abdominal aorta is normal caliber with atherosclerotic calcification. There is no retroperitoneal or periportal lymphadenopathy. No pelvic lymphadenopathy. Reproductive: Prostate normal Other: No free fluid. Musculoskeletal: Stable sclerosis within the posterior RIGHT iliac bone along the SI joint (image 104/2). Posterior lumbar fusion IMPRESSION: Chest Impression: 1. Essentially stable dense consolidation in the lingula and LEFT lower lobe. 2. No mediastinal lymphadenopathy. Abdomen / Pelvis Impression: 1. Unfortunately there is NEW multifocal hepatic metastasis and enlargement of the previous caudate lobe metastasis. 2. No additional evidence of metastatic disease in the abdomen pelvis. 3. No evidence skeletal metastasis Electronically Signed   By: Suzy Bouchard M.D.   On: 10/16/2021 10:43   CT Abdomen Pelvis W Contrast  Result Date: 10/16/2021 CLINICAL DATA:  Primary Cancer Type: Lung Imaging Indication: Active Surveillance Interval therapy since last imaging? No Initial Cancer Diagnosis Date: 02/25/2021;  Established by: Biopsy-proven Detailed Pathology: Stage IV non-small cell lung cancer, adenocarcinoma. Primary Tumor location:  Left lower lobe. Surgeries: Coronary stent. Lumbar fusion. Chemotherapy: Yes; Ongoing? No; Most recent administration: 07/04/2021 Immunotherapy?  Yes; Type: Keytruda; Ongoing? No Radiation therapy? 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 EXAM: CT CHEST, ABDOMEN, AND PELVIS WITH CONTRAST TECHNIQUE: Multidetector CT imaging of the chest, abdomen and pelvis was performed following the standard protocol during bolus administration of intravenous contrast. RADIATION DOSE REDUCTION: This exam was performed according to the departmental dose-optimization program which includes automated exposure control, adjustment of the mA and/or kV according to patient size and/or use of iterative reconstruction technique. CONTRAST:  125m OMNIPAQUE IOHEXOL 300 MG/ML  SOLN COMPARISON:  Most recent CT chest, abdomen and pelvis 07/31/2021. 03/24/2021 PET-CT. FINDINGS: CT CHEST FINDINGS Cardiovascular: Coronary artery calcification and aortic atherosclerotic calcification. Mediastinum/Nodes: Small paratracheal lymph nodes are not pathologic by size criteria and not change from prior. Esophagus normal. Trachea and Lungs/Pleura: Dense consolidation with air bronchograms in the lingula unchanged from prior. Consolidation in the LEFT lower lobe is slightly increased (image 104/4). No discrete nodularity. Volume loss in the LEFT hemithorax. No nodularity in the RIGHT lung. Musculoskeletal: No aggressive osseous lesion. CT ABDOMEN AND PELVIS FINDINGS Hepatobiliary: Previous described lesion in the caudate lobe is increased in size measuring 4.1 x 3.2 cm compared to 2.6 x 1.5 cm. New lesion in the superior RIGHT hepatic lobe measures 2.2 cm (image 53/2). Subtle new lesion in the inferior RIGHT hepatic  lobe measures 1.4 cm on image 66/2. A subcentimeter lesion in the RIGHT hepatic lobe on image 68/2 is also suspicious Pancreas: Pancreas is normal. No ductal dilatation. No pancreatic inflammation. Spleen: Normal spleen Adrenals/urinary tract: Adrenal glands and kidneys are normal. The ureters and bladder normal. Stomach/Bowel: Stomach, small bowel, appendix, and cecum are normal. The colon and rectosigmoid colon are normal. Vascular/Lymphatic: Abdominal aorta is normal caliber with atherosclerotic calcification. There is no retroperitoneal or periportal lymphadenopathy. No pelvic lymphadenopathy. Reproductive: Prostate normal Other: No free fluid. Musculoskeletal: Stable sclerosis within the posterior RIGHT iliac bone along the SI joint (image 104/2). Posterior lumbar fusion IMPRESSION: Chest Impression: 1. Essentially stable dense  consolidation in the lingula and LEFT lower lobe. 2. No mediastinal lymphadenopathy. Abdomen / Pelvis Impression: 1. Unfortunately there is NEW multifocal hepatic metastasis and enlargement of the previous caudate lobe metastasis. 2. No additional evidence of metastatic disease in the abdomen pelvis. 3. No evidence skeletal metastasis Electronically Signed   By: Suzy Bouchard M.D.   On: 10/16/2021 10:43    ASSESSMENT AND PLAN: This is a very pleasant 76 years old white male recently diagnosed with stage IV (T1b, N2, M1c) non-small cell lung cancer favoring adenocarcinoma presented with left lung mass in addition to left hilar adenopathy and malignant left pleural effusion, intrathoracic and upper abdominal lymph nodes, left pleural/subpleural nodularity and hepatic lesions diagnosed in May 2022. He has no actionable mutation and PD-L1 expression of 30%. He is status post palliative radiotherapy to the obstructive lung mass in the left lung under the care of Dr. Tammi Klippel.  The patient is currently undergoing systemic chemotherapy with carboplatin initially for AUC of 5, Alimta 500  Mg/M2 and Keytruda 200 Mg IV every 3 weeks status post 6 cycles.  Starting from cycle #2 the patient is on treatment with carboplatin for AUC of 4 and Alimta 400 Mg/M2.  Starting from cycle #5 the patient is on maintenance treatment with Alimta and Keytruda every 3 weeks.  The patient has been off treatment since July 14, 2021 secondary to prolonged hospitalization with immunotherapy mediated pneumonitis and significant fatigue and weakness with rehabilitation. He had repeat CT scan of the chest, abdomen pelvis performed recently.  I personally and independently reviewed the scan images and discussed the results with the patient today. His scan showed no concerning finding for disease progression in the chest but unfortunately there are new multifocal hepatic metastasis. I discussed with the patient several options for management of his condition.  I recommended for him to resume his treatment with single agent Alimta 500 Mg/M2 every 3 weeks for 2 cycles before repeating imaging studies.  If he has improvement of his disease in this regiment he will continue with the maintenance therapy otherwise I will discuss with him other treatment options including second line chemotherapy with docetaxel and Cyramza. The patient and his wife are in agreement with the current plan. He will start the next cycle of his treatment next week. He was advised to call immediately if he has any other concerning symptoms in the interval.  The patient voices understanding of current disease status and treatment options and is in agreement with the current care plan.  All questions were answered. The patient knows to call the clinic with any problems, questions or concerns. We can certainly see the patient much sooner if necessary. The total time spent in the appointment was 40 minutes.   Disclaimer: This note was dictated with voice recognition software. Similar sounding words can inadvertently be transcribed and may not  be corrected upon review.

## 2021-10-22 ENCOUNTER — Other Ambulatory Visit: Payer: Self-pay | Admitting: Cardiovascular Disease

## 2021-10-23 ENCOUNTER — Ambulatory Visit: Payer: Medicare PPO | Admitting: Pulmonary Disease

## 2021-10-23 ENCOUNTER — Telehealth: Payer: Self-pay | Admitting: Medical Oncology

## 2021-10-23 ENCOUNTER — Encounter: Payer: Self-pay | Admitting: Pulmonary Disease

## 2021-10-23 ENCOUNTER — Other Ambulatory Visit: Payer: Self-pay

## 2021-10-23 VITALS — BP 114/68 | HR 94 | Temp 97.8°F | Ht 73.0 in | Wt 229.8 lb

## 2021-10-23 DIAGNOSIS — C3492 Malignant neoplasm of unspecified part of left bronchus or lung: Secondary | ICD-10-CM

## 2021-10-23 DIAGNOSIS — J9611 Chronic respiratory failure with hypoxia: Secondary | ICD-10-CM

## 2021-10-23 DIAGNOSIS — J90 Pleural effusion, not elsewhere classified: Secondary | ICD-10-CM

## 2021-10-23 NOTE — Telephone Encounter (Signed)
I have spoken with the pt and advised as indicated. Pt is in agreement and states he will plan to present for his treatments.

## 2021-10-23 NOTE — Telephone Encounter (Signed)
Pt wants to postpone chemotherapy until March -( 6 weeks) He is in rehab and wants more time to get stronger so he can handle any side effects .

## 2021-10-23 NOTE — Progress Notes (Signed)
Synopsis: Referred in June 2022 for malignant pleural effusion, PCP: By [name], MD  Subjective:   PATIENT ID: Corey [name] GENDER: male DOB: 10/03/1946, MRN: [id number]  Chief Complaint  Patient presents with   Follow-up    Follow up. Patient says everything is going good and he is making progress. He has questions about oxygen he is on and about prescription.     This is a 76 year old gentleman, past medical history of coronary artery disease, coronary artery disease, gastroesophageal reflux, atrial fibrillation on Xarelto, OSA on CPAP.  Patient was recently admitted to the hospital on 02/24/2021.  Discharge summary reviewed from Dr. Maye Hides.  Patient was admitted for evaluation of dyspnea.  Found to have large left-sided pleural effusion with left-sided infrahilar mass.  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  Patient was taken for thoracentesis and bronchoscopy by Dr. Tamala Julian.  Pathology was consistent with non-small cell carcinoma.  Patient had subsequent follow-up medical oncology on 03/05/2021.  Met with Dr. [name].  Office note reviewed.  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  Patient scheduled for follow-up with me today due to complaints of ongoing shortness of breath and dyspnea and discussion of outpatient thoracentesis.  And management of recurrent pleural effusion.  OV 03/27/2021: Here today for follow-up after recent indwelling pleural catheter placement.  He has been doing well since then.  Please see drainage report above initially 8000 cc now has drained down to approximately 75 cc on the 20th.  He has been draining every couple of days.  At this point shortness of breath has resolved.  His wound site looks well.  Has home health its been coming out to help him drain and check the site.   From respiratory standpoint doing better.  He has first round of chemotherapy scheduled for next week.  OV 10/23/2021: Here today for follow-up.  Patient's Pleurx catheter has subsequently been removed since last office visit.  He has followed up with medical oncology.  He got sick after a round of chemo.  He has not restarted chemo yet.  He saw Dr. [name] recently and discussed his restart plans.  Hopefully getting back on therapy in the next couple of weeks.  He is a little hesitant about this.  But I encouraged him to follow Dr. Lew Dawes recommendations that he has his best interest for him.  We would continue to watch him closely.  He does have some's concerns and needs for a oxygen concentrator upstairs.  We will put referrals in for DME supplies to help him get this.   Past Medical History:  Diagnosis Date   Anemia    Anxiety    Arthritis    "back, right knee, hands, ankles, neck" (05/31/2016)   Carotid artery disease ([place]) 08/30/2016   Carotid US 11/19: R 1-39, L 40-59 // Carotid US 08/2019: R 1-39; L 40-59 // Carotid US 11/21: R 1-39; L 40-59>> repeat 1 year    Chronic lower back pain    Coronary atherosclerosis of native coronary artery    a. BMS to [facility] 2004 and 2007, otherwise mild nonobstructive disease. EF normal.   Diverticulitis    Dyslipidemia    Dyspnea    Essential hypertension, benign    Family history of breast cancer    Family history of lung cancer  Family history of prostate cancer    GERD (gastroesophageal reflux disease)    Lumbar radiculopathy, chronic 02/04/2015   Right L5   Obesity    OSA on CPAP    uses cpap   Paroxysmal atrial fibrillation ([place])    a. Discovered after stroke.   Pneumonia 01/1996   PONV (postoperative nausea and vomiting)    Prostate CA (HCC)    Recurrent upper respiratory infection (URI)    Stroke ([place]) 07/2012   no deficits   Visit for monitoring Tikosyn therapy 09/18/2019     Family History  Problem Relation Age of Onset    Hypertension Mother    Heart disease Father    Heart attack Father    Prostate cancer Brother 59   Parkinson's disease Brother    Lung cancer Paternal Aunt        hx of smoking   Breast cancer Paternal Grandmother        dx 77s, bilateral mastectomies   Heart attack Paternal Grandfather    Blindness Son    Colon cancer Neg Hx    Pancreatic cancer Neg Hx      Past Surgical History:  Procedure Laterality Date   ADENOIDECTOMY     ANTERIOR CERVICAL DECOMP/DISCECTOMY FUSION  07/2001; 10/2002   "C5-6; C6-7; redo"   BACK SURGERY     CARPAL TUNNEL RELEASE Left 10/2015   CHEST TUBE INSERTION Left 03/17/2021   Procedure: INSERTION PLEURAL DRAINAGE CATHETER;  Surgeon: Garner Nash, DO;  Location: [facility];  Service: Pulmonary;  Laterality: Left;  Indwelling Tunneled Pleural catheter (PLEUREX)    COLONOSCOPY W/ POLYPECTOMY  02/2014   CORONARY ANGIOPLASTY WITH STENT PLACEMENT  05/2003; 12/2005   "mid RCA; mid RCA"   FINE NEEDLE ASPIRATION  02/26/2021   Procedure: FINE NEEDLE ASPIRATION (FNA) LINEAR;  Surgeon: Candee Furbish, MD;  Location: [facility] ENDOSCOPY;  Service: Pulmonary;;   HAND SURGERY  02/2019   LEFT HAND   implantable loop recorder placement  02/27/2020   Medtronic Reveal [place] model [id number]  implantable loop recorder implanted by Dr Rayann Heman for afib management and evaluation of presyncope   IR IMAGING GUIDED PORT INSERTION  04/16/2021   JOINT REPLACEMENT     KNEE ARTHROSCOPY Left 10/2005   KNEE ARTHROSCOPY W/ PARTIAL MEDIAL MENISCECTOMY Left 09/2005   LUMBAR LAMINECTOMY/DECOMPRESSION MICRODISCECTOMY  03/2005   "L4-5"   POSTERIOR LUMBAR FUSION  10/2003   L5-S1; "plates, screws"   SHOULDER ARTHROSCOPY Right 08/2011   Debridement of labrum, arthroscopic distal clavicle excision   SHOULDER OPEN ROTATOR CUFF REPAIR Left 07/2014   TEE WITHOUT CARDIOVERSION  07/07/2012   Procedure: TRANSESOPHAGEAL ECHOCARDIOGRAM (TEE);  Surgeon: Fay Records, MD;  Location: [facility] ENDOSCOPY;   Service: Cardiovascular;  Laterality: N/A;   THORACENTESIS N/A 02/25/2021   Procedure: Mathews Robinsons;  Surgeon: Juanito Doom, MD;  Location: [place];  Service: Cardiopulmonary;  Laterality: N/A;   TONSILLECTOMY AND ADENOIDECTOMY  ~ 1956   TOTAL KNEE ARTHROPLASTY Left 10/2006   TRIGGER FINGER RELEASE Left 10/2015   VIDEO BRONCHOSCOPY WITH ENDOBRONCHIAL ULTRASOUND Left 02/26/2021   Procedure: VIDEO BRONCHOSCOPY WITH ENDOBRONCHIAL ULTRASOUND;  Surgeon: Candee Furbish, MD;  Location: [facility] ENDOSCOPY;  Service: Pulmonary;  Laterality: Left;  cryoprobe too thanks!    Social History   Socioeconomic History   Marital status: Married    Spouse name: Renee   Number of children: 3   Years of education: 14   Highest education level: Not on file  Occupational  History   Occupation: Retired    Fish farm manager: DISABLED     Comment: Works at [employer] part time  Tobacco Use   Smoking status: Former    Packs/day: 1.00    Years: 34.00    Pack years: 34.00    Types: Cigarettes    Quit date: 05/05/2003    Years since quitting: 18.4    Passive exposure: Never   Smokeless tobacco: Never  Vaping Use   Vaping Use: Never used  Substance and Sexual Activity   Alcohol use: No    Alcohol/week: 0.0 standard drinks   Drug use: No   Sexual activity: Not Currently  Other Topics Concern   Not on file  Social History Narrative   Patient lives at home with spouse.   Caffeine Use:    Social Determinants of Radio broadcast assistant Strain: Not on file  Food Insecurity: Not on file  Transportation Needs: Not on file  Physical Activity: Not on file  Stress: Not on file  Social Connections: Not on file  Intimate Partner Violence: Not on file     Allergies  Allergen Reactions   Amoxicillin Swelling    Tolerates Rocephin   Fosinopril Other (See Comments)   Hydromorphone Nausea Only, Other (See Comments) and Nausea And Vomiting   Ampicillin Rash   Codeine Nausea Only and Other (See Comments)     Heavy amounts cause nausea Other reaction(s): stomach upset   Monopril [Fosinopril Sodium] Other (See Comments)    Muscle aches & pain   Other Other (See Comments)   Rosuvastatin Other (See Comments)    Joint pain     Outpatient Medications Prior to Visit  Medication Sig Dispense Refill   acetaminophen (TYLENOL) 325 MG tablet Take 2 tablets (650 mg total) by mouth every 6 (six) hours as needed for mild pain (or Fever >/= 101).     albuterol (PROVENTIL) (2.5 MG/3ML) 0.083% nebulizer solution Inhale 3 mLs into the lungs every 4 (four) hours as needed for wheezing or shortness of breath. 75 mL 12   ALPRAZolam (XANAX) 0.5 MG tablet Take 1 tablet (0.5 mg total) by mouth at bedtime as needed for anxiety. 10 tablet 0   atorvastatin (LIPITOR) 40 MG tablet Take 1 tablet (40 mg total) by mouth daily. 90 tablet 3   benzonatate (TESSALON) 200 MG capsule Take 1 capsule (200 mg total) by mouth 3 (three) times daily. 90 capsule 0   budesonide (PULMICORT) 0.5 MG/2ML nebulizer solution Take 2 mLs (0.5 mg total) by nebulization 2 (two) times daily. 60 mL 12   carbidopa-levodopa (SINEMET IR) 25-100 MG tablet Take 1 tablet by mouth 3 (three) times daily. (Patient taking differently: Take 1 tablet by mouth 3 (three) times daily. 0800, 1200, 1600) 270 tablet 1   chlorpheniramine-HYDROcodone (TUSSIONEX) 10-8 MG/5ML SUER Take 5 mLs by mouth every 12 (twelve) hours as needed for cough. 473 mL 0   dofetilide (TIKOSYN) 500 MCG capsule Take 1 capsule (500 mcg total) by mouth 2 (two) times daily. Please keep upcoming appointment in February for future refills. Thank you 180 capsule 3   fenofibrate 160 MG tablet TAKE 1 TABLET DAILY. PLEASE KEEP UPCOMING APPT IN MARCH WITH DR. Acie Fredrickson BEFORE ANYMORE REFILLS. 90 tablet 3   fluticasone (FLONASE) 50 MCG/ACT nasal spray Place 2 sprays into both nostrils daily. 79.8 mL 5   folic acid (FOLVITE) 1 MG tablet Take 1 tablet (1 mg total) by mouth daily. 90 tablet 1   guaiFENesin  (MUCINEX) 600  MG 12 hr tablet Take 2 tablets (1,200 mg total) by mouth 2 (two) times daily.     ipratropium (ATROVENT) 0.02 % nebulizer solution Take 2.5 mLs (0.5 mg total) by nebulization 2 (two) times daily. 75 mL 12   ipratropium (ATROVENT) 0.03 % nasal spray Place 1-2 sprays into both nostrils every 12 (twelve) hours. For drainage 30 mL 5   levalbuterol (XOPENEX) 0.63 MG/3ML nebulizer solution Take 3 mLs (0.63 mg total) by nebulization 2 (two) times daily. 3 mL 12   loratadine (CLARITIN) 10 MG tablet Take 1 tablet (10 mg total) by mouth daily as needed for allergies (nasal drainage). 30 tablet 0   magnesium oxide (MAG-OX) 400 (240 Mg) MG tablet Take 1 tablet (400 mg total) by mouth daily. 90 tablet 2   metoprolol succinate (TOPROL-XL) 25 MG 24 hr tablet Take 1 tablet (25 mg total) by mouth daily. 90 tablet 1   Multiple Vitamins-Minerals (MULTIVITAMINS THER. W/MINERALS) TABS Take 1 tablet by mouth daily.     nitroGLYCERIN (NITROSTAT) 0.4 MG SL tablet Place 1 tablet (0.4 mg total) under the tongue every 5 (five) minutes as needed for chest pain. 25 tablet 5   omeprazole (PRILOSEC) 20 MG capsule Take 1 capsule (20 mg total) by mouth daily. 30 capsule 5   polyethylene glycol (MIRALAX / GLYCOLAX) 17 g packet Take 17 g by mouth daily. 14 each 0   potassium chloride (KLOR-CON) 10 MEQ tablet TAKE 1 TABLET BY MOUTH EVERY DAY (Patient taking differently: Take 10 mEq by mouth daily.) 90 tablet 1   rivaroxaban (XARELTO) 20 MG TABS tablet Take 1 tablet (20 mg total) by mouth daily. 90 tablet 2   Saccharomyces boulardii (PROBIOTIC) 250 MG CAPS Take 250 mg by mouth daily.     tamsulosin (FLOMAX) 0.4 MG CAPS capsule Take 1 capsule (0.4 mg total) by mouth daily after supper. 30 capsule 0   Tiotropium Bromide-Olodaterol (STIOLTO RESPIMAT) 2.5-2.5 MCG/ACT AERS Inhale 2 puffs into the lungs daily. 4 g 5   topiramate (TOPAMAX) 50 MG tablet Take 2 tablets (100 mg total) by mouth 2 (two) times daily. 360 tablet 3    traMADol (ULTRAM) 50 MG tablet Take 1 tablet (50 mg total) by mouth every 6 (six) hours as needed for moderate pain. 30 tablet 0   traZODone (DESYREL) 50 MG tablet Take 1 tablet (50 mg total) by mouth at bedtime. 30 tablet 0   Vitamin D, Ergocalciferol, (DRISDOL) 1.25 MG (50000 UNIT) CAPS capsule Take 1 capsule (50,000 Units total) by mouth every 7 (seven) days. Monday 12 capsule 0   No facility-administered medications prior to visit.    Review of Systems  Constitutional:  Positive for malaise/fatigue. Negative for chills, fever and weight loss.  HENT:  Negative for hearing loss, sore throat and tinnitus.   Eyes:  Negative for blurred vision and double vision.  Respiratory:  Positive for cough and shortness of breath. Negative for hemoptysis, sputum production, wheezing and stridor.   Cardiovascular:  Negative for chest pain, palpitations, orthopnea, leg swelling and PND.  Gastrointestinal:  Negative for abdominal pain, constipation, diarrhea, heartburn, nausea and vomiting.  Genitourinary:  Negative for dysuria, hematuria and urgency.  Musculoskeletal:  Positive for myalgias. Negative for joint pain.  Skin:  Negative for itching and rash.  Neurological:  Negative for dizziness, tingling, weakness and headaches.  Endo/Heme/Allergies:  Negative for environmental allergies. Does not bruise/bleed easily.  Psychiatric/Behavioral:  Negative for depression. The patient is not nervous/anxious and does not have insomnia.  All other systems reviewed and are negative.   Objective:  Physical Exam Vitals reviewed.  Constitutional:      General: He is not in acute distress.    Appearance: He is well-developed.  HENT:     Head: Normocephalic and atraumatic.  Eyes:     General: No scleral icterus.    Conjunctiva/sclera: Conjunctivae normal.     Pupils: Pupils are equal, round, and reactive to light.  Neck:     Vascular: No JVD.     Trachea: No tracheal deviation.  Cardiovascular:     Rate  and Rhythm: Normal rate and regular rhythm.     Heart sounds: Normal heart sounds. No murmur heard. Pulmonary:     Effort: Pulmonary effort is normal. No tachypnea, accessory muscle usage or respiratory distress.     Breath sounds: No stridor. No wheezing, rhonchi or rales.     Comments: Diminshed breath sounds bilaterally Abdominal:     General: There is no distension.     Palpations: Abdomen is soft.     Tenderness: There is no abdominal tenderness.  Musculoskeletal:        General: No tenderness.     Cervical back: Neck supple.  Lymphadenopathy:     Cervical: No cervical adenopathy.  Skin:    General: Skin is warm and dry.     Capillary Refill: Capillary refill takes less than 2 seconds.     Findings: No rash.  Neurological:     Mental Status: He is alert and oriented to person, place, and time.  Psychiatric:        Behavior: Behavior normal.     Vitals:   10/23/21 1149  BP: 114/68  Pulse: 94  Temp: 97.8 F (36.6 C)  TempSrc: Oral  SpO2: 98%  Weight: 229 lb 12.8 oz (104.2 kg)  Height: _0  (1.854 m)   98% on RA BMI Readings from Last 3 Encounters:  10/23/21 30.32 kg/m  10/20/21 29.82 kg/m  10/07/21 29.17 kg/m   Wt Readings from Last 3 Encounters:  10/23/21 229 lb 12.8 oz (104.2 kg)  10/20/21 229 lb 1.6 oz (103.9 kg)  10/07/21 224 lb 1.6 oz (101.7 kg)     CBC    Component Value Date/Time   WBC 9.3 10/20/2021 1006   WBC 10.1 08/16/2021 0301   RBC 3.01 (L) 10/20/2021 1006   HGB 10.5 (L) 10/20/2021 1006   HGB 9.7 (L) 08/05/2021 0643   HCT 32.0 (L) 10/20/2021 1006   HCT 35.6 (L) 06/25/2020 1336   PLT 294 10/20/2021 1006   PLT 275 06/25/2020 1336   MCV 106.3 (H) 10/20/2021 1006   MCV 93 06/25/2020 1336   MCH 34.9 (H) 10/20/2021 1006   MCHC 32.8 10/20/2021 1006   RDW 14.9 10/20/2021 1006   RDW 14.9 06/25/2020 1336   LYMPHSABS 0.5 (L) 10/20/2021 1006   MONOABS 0.9 10/20/2021 1006   EOSABS 0.2 10/20/2021 1006   BASOSABS 0.1 10/20/2021 1006     Chest Imaging: Chest x-ray completed today 03/09/2021: Reduction in left-sided pleural effusion. No pneumothorax The patient's images have been independently reviewed by me.    Pulmonary Functions Testing Results: No flowsheet data found.  FeNO:   Pathology:   Echocardiogram:   Heart Catheterization:     Assessment & Plan:     ICD-10-CM   1. Chronic respiratory failure with hypoxia (HCC)  J96.11 AMB REFERRAL FOR DME    2. Non-small cell carcinoma of left lung, stage 4 (HCC)  C34.92  3. Pleural effusion on left  J90       Discussion:  This is a 76 year old gentleman with a stage IV non-small cell lung cancer, recurrent left-sided effusion was managed with an IVC now removed.  Planning to restart chemotherapy.  Plan: Needs new orders for a home oxygen concentrator. Check him today on 2 L continuous at rest to see if he can drop down from 4 L. Patient was reminded that his goal O2 saturations of 88% Return to clinic in 6 months or as needed.    Current Outpatient Medications:    acetaminophen (TYLENOL) 325 MG tablet, Take 2 tablets (650 mg total) by mouth every 6 (six) hours as needed for mild pain (or Fever >/= 101)., Disp: , Rfl:    albuterol (PROVENTIL) (2.5 MG/3ML) 0.083% nebulizer solution, Inhale 3 mLs into the lungs every 4 (four) hours as needed for wheezing or shortness of breath., Disp: 75 mL, Rfl: 12   ALPRAZolam (XANAX) 0.5 MG tablet, Take 1 tablet (0.5 mg total) by mouth at bedtime as needed for anxiety., Disp: 10 tablet, Rfl: 0   atorvastatin (LIPITOR) 40 MG tablet, Take 1 tablet (40 mg total) by mouth daily., Disp: 90 tablet, Rfl: 3   benzonatate (TESSALON) 200 MG capsule, Take 1 capsule (200 mg total) by mouth 3 (three) times daily., Disp: 90 capsule, Rfl: 0   budesonide (PULMICORT) 0.5 MG/2ML nebulizer solution, Take 2 mLs (0.5 mg total) by nebulization 2 (two) times daily., Disp: 60 mL, Rfl: 12   carbidopa-levodopa (SINEMET IR) 25-100 MG tablet,  Take 1 tablet by mouth 3 (three) times daily. (Patient taking differently: Take 1 tablet by mouth 3 (three) times daily. 0800, 1200, 1600), Disp: 270 tablet, Rfl: 1   chlorpheniramine-HYDROcodone (TUSSIONEX) 10-8 MG/5ML SUER, Take 5 mLs by mouth every 12 (twelve) hours as needed for cough., Disp: 473 mL, Rfl: 0   dofetilide (TIKOSYN) 500 MCG capsule, Take 1 capsule (500 mcg total) by mouth 2 (two) times daily. Please keep upcoming appointment in February for future refills. Thank you, Disp: 180 capsule, Rfl: 3   fenofibrate 160 MG tablet, TAKE 1 TABLET DAILY. PLEASE KEEP UPCOMING APPT IN MARCH WITH DR. Acie Fredrickson BEFORE ANYMORE REFILLS., Disp: 90 tablet, Rfl: 3   fluticasone (FLONASE) 50 MCG/ACT nasal spray, Place 2 sprays into both nostrils daily., Disp: 16.1 mL, Rfl: 5   folic acid (FOLVITE) 1 MG tablet, Take 1 tablet (1 mg total) by mouth daily., Disp: 90 tablet, Rfl: 1   guaiFENesin (MUCINEX) 600 MG 12 hr tablet, Take 2 tablets (1,200 mg total) by mouth 2 (two) times daily., Disp: , Rfl:    ipratropium (ATROVENT) 0.02 % nebulizer solution, Take 2.5 mLs (0.5 mg total) by nebulization 2 (two) times daily., Disp: 75 mL, Rfl: 12   ipratropium (ATROVENT) 0.03 % nasal spray, Place 1-2 sprays into both nostrils every 12 (twelve) hours. For drainage, Disp: 30 mL, Rfl: 5   levalbuterol (XOPENEX) 0.63 MG/3ML nebulizer solution, Take 3 mLs (0.63 mg total) by nebulization 2 (two) times daily., Disp: 3 mL, Rfl: 12   loratadine (CLARITIN) 10 MG tablet, Take 1 tablet (10 mg total) by mouth daily as needed for allergies (nasal drainage)., Disp: 30 tablet, Rfl: 0   magnesium oxide (MAG-OX) 400 (240 Mg) MG tablet, Take 1 tablet (400 mg total) by mouth daily., Disp: 90 tablet, Rfl: 2   metoprolol succinate (TOPROL-XL) 25 MG 24 hr tablet, Take 1 tablet (25 mg total) by mouth daily., Disp: 90 tablet, Rfl: 1  Multiple Vitamins-Minerals (MULTIVITAMINS THER. W/MINERALS) TABS, Take 1 tablet by mouth daily., Disp: , Rfl:     nitroGLYCERIN (NITROSTAT) 0.4 MG SL tablet, Place 1 tablet (0.4 mg total) under the tongue every 5 (five) minutes as needed for chest pain., Disp: 25 tablet, Rfl: 5   omeprazole (PRILOSEC) 20 MG capsule, Take 1 capsule (20 mg total) by mouth daily., Disp: 30 capsule, Rfl: 5   polyethylene glycol (MIRALAX / GLYCOLAX) 17 g packet, Take 17 g by mouth daily., Disp: 14 each, Rfl: 0   potassium chloride (KLOR-CON) 10 MEQ tablet, TAKE 1 TABLET BY MOUTH EVERY DAY (Patient taking differently: Take 10 mEq by mouth daily.), Disp: 90 tablet, Rfl: 1   rivaroxaban (XARELTO) 20 MG TABS tablet, Take 1 tablet (20 mg total) by mouth daily., Disp: 90 tablet, Rfl: 2   Saccharomyces boulardii (PROBIOTIC) 250 MG CAPS, Take 250 mg by mouth daily., Disp: , Rfl:    tamsulosin (FLOMAX) 0.4 MG CAPS capsule, Take 1 capsule (0.4 mg total) by mouth daily after supper., Disp: 30 capsule, Rfl: 0   Tiotropium Bromide-Olodaterol (STIOLTO RESPIMAT) 2.5-2.5 MCG/ACT AERS, Inhale 2 puffs into the lungs daily., Disp: 4 g, Rfl: 5   topiramate (TOPAMAX) 50 MG tablet, Take 2 tablets (100 mg total) by mouth 2 (two) times daily., Disp: 360 tablet, Rfl: 3   traMADol (ULTRAM) 50 MG tablet, Take 1 tablet (50 mg total) by mouth every 6 (six) hours as needed for moderate pain., Disp: 30 tablet, Rfl: 0   traZODone (DESYREL) 50 MG tablet, Take 1 tablet (50 mg total) by mouth at bedtime., Disp: 30 tablet, Rfl: 0   Vitamin D, Ergocalciferol, (DRISDOL) 1.25 MG (50000 UNIT) CAPS capsule, Take 1 capsule (50,000 Units total) by mouth every 7 (seven) days. Monday, Disp: 12 capsule, Rfl: 0   Garner Nash, DO [facility] 10/24/2021 8:21 AM

## 2021-10-23 NOTE — Patient Instructions (Addendum)
Thank you for visiting Dr. Valeta Harms at [facility]. Today we recommend the following:  Continue chemo plan per oncology  DME supply for new oxygen concentrator upstairs   Return in about 6 months (around 04/22/2022) for with Eric Form, NP, or Dr. Valeta Harms.    Please do your part to reduce the spread of COVID-19.

## 2021-10-26 ENCOUNTER — Ambulatory Visit (INDEPENDENT_AMBULATORY_CARE_PROVIDER_SITE_OTHER): Payer: Medicare PPO

## 2021-10-26 DIAGNOSIS — R55 Syncope and collapse: Secondary | ICD-10-CM

## 2021-10-26 LAB — CUP PACEART REMOTE DEVICE CHECK
Date Time Interrogation Session: 20230122234118
Implantable Pulse Generator Implant Date: 20210526

## 2021-10-27 DIAGNOSIS — E7800 Pure hypercholesterolemia, unspecified: Secondary | ICD-10-CM | POA: Diagnosis not present

## 2021-10-27 DIAGNOSIS — C3490 Malignant neoplasm of unspecified part of unspecified bronchus or lung: Secondary | ICD-10-CM | POA: Diagnosis not present

## 2021-10-27 DIAGNOSIS — I671 Cerebral aneurysm, nonruptured: Secondary | ICD-10-CM | POA: Diagnosis not present

## 2021-10-27 DIAGNOSIS — Z0001 Encounter for general adult medical examination with abnormal findings: Secondary | ICD-10-CM | POA: Diagnosis not present

## 2021-10-27 DIAGNOSIS — D6869 Other thrombophilia: Secondary | ICD-10-CM | POA: Diagnosis not present

## 2021-10-27 DIAGNOSIS — C61 Malignant neoplasm of prostate: Secondary | ICD-10-CM | POA: Diagnosis not present

## 2021-10-27 DIAGNOSIS — I2510 Atherosclerotic heart disease of native coronary artery without angina pectoris: Secondary | ICD-10-CM | POA: Diagnosis not present

## 2021-10-28 ENCOUNTER — Inpatient Hospital Stay: Payer: Medicare PPO

## 2021-10-28 ENCOUNTER — Other Ambulatory Visit: Payer: Self-pay

## 2021-10-28 ENCOUNTER — Other Ambulatory Visit: Payer: Self-pay | Admitting: Allergy

## 2021-10-28 VITALS — BP 126/81 | HR 95 | Temp 97.9°F | Resp 20 | Wt 228.5 lb

## 2021-10-28 DIAGNOSIS — Z79899 Other long term (current) drug therapy: Secondary | ICD-10-CM | POA: Diagnosis not present

## 2021-10-28 DIAGNOSIS — Z7901 Long term (current) use of anticoagulants: Secondary | ICD-10-CM | POA: Diagnosis not present

## 2021-10-28 DIAGNOSIS — C3492 Malignant neoplasm of unspecified part of left bronchus or lung: Secondary | ICD-10-CM

## 2021-10-28 DIAGNOSIS — J910 Malignant pleural effusion: Secondary | ICD-10-CM | POA: Diagnosis not present

## 2021-10-28 DIAGNOSIS — Z5111 Encounter for antineoplastic chemotherapy: Secondary | ICD-10-CM | POA: Diagnosis not present

## 2021-10-28 DIAGNOSIS — C3412 Malignant neoplasm of upper lobe, left bronchus or lung: Secondary | ICD-10-CM | POA: Diagnosis not present

## 2021-10-28 DIAGNOSIS — D649 Anemia, unspecified: Secondary | ICD-10-CM

## 2021-10-28 DIAGNOSIS — C778 Secondary and unspecified malignant neoplasm of lymph nodes of multiple regions: Secondary | ICD-10-CM | POA: Diagnosis not present

## 2021-10-28 DIAGNOSIS — Z95828 Presence of other vascular implants and grafts: Secondary | ICD-10-CM

## 2021-10-28 DIAGNOSIS — C787 Secondary malignant neoplasm of liver and intrahepatic bile duct: Secondary | ICD-10-CM | POA: Diagnosis not present

## 2021-10-28 LAB — CBC WITH DIFFERENTIAL (CANCER CENTER ONLY)
Abs Immature Granulocytes: 0.26 10*3/uL — ABNORMAL HIGH (ref 0.00–0.07)
Basophils Absolute: 0.1 10*3/uL (ref 0.0–0.1)
Basophils Relative: 1 %
Eosinophils Absolute: 0.2 10*3/uL (ref 0.0–0.5)
Eosinophils Relative: 2 %
HCT: 32.6 % — ABNORMAL LOW (ref 39.0–52.0)
Hemoglobin: 10.4 g/dL — ABNORMAL LOW (ref 13.0–17.0)
Immature Granulocytes: 3 %
Lymphocytes Relative: 4 %
Lymphs Abs: 0.4 10*3/uL — ABNORMAL LOW (ref 0.7–4.0)
MCH: 33.9 pg (ref 26.0–34.0)
MCHC: 31.9 g/dL (ref 30.0–36.0)
MCV: 106.2 fL — ABNORMAL HIGH (ref 80.0–100.0)
Monocytes Absolute: 0.7 10*3/uL (ref 0.1–1.0)
Monocytes Relative: 7 %
Neutro Abs: 7.7 10*3/uL (ref 1.7–7.7)
Neutrophils Relative %: 83 %
Platelet Count: 259 10*3/uL (ref 150–400)
RBC: 3.07 MIL/uL — ABNORMAL LOW (ref 4.22–5.81)
RDW: 14.7 % (ref 11.5–15.5)
WBC Count: 9.3 10*3/uL (ref 4.0–10.5)
nRBC: 0 % (ref 0.0–0.2)

## 2021-10-28 LAB — CMP (CANCER CENTER ONLY)
ALT: 12 U/L (ref 0–44)
AST: 17 U/L (ref 15–41)
Albumin: 3.5 g/dL (ref 3.5–5.0)
Alkaline Phosphatase: 51 U/L (ref 38–126)
Anion gap: 7 (ref 5–15)
BUN: 24 mg/dL — ABNORMAL HIGH (ref 8–23)
CO2: 25 mmol/L (ref 22–32)
Calcium: 9.2 mg/dL (ref 8.9–10.3)
Chloride: 105 mmol/L (ref 98–111)
Creatinine: 0.88 mg/dL (ref 0.61–1.24)
GFR, Estimated: >60 mL/min (ref >60)
Glucose, Bld: 118 mg/dL — ABNORMAL HIGH (ref 70–99)
Potassium: 3.7 mmol/L (ref 3.5–5.1)
Sodium: 137 mmol/L (ref 135–145)
Total Bilirubin: 0.3 mg/dL (ref 0.3–1.2)
Total Protein: 6.7 g/dL (ref 6.5–8.1)

## 2021-10-28 LAB — SAMPLE TO BLOOD BANK

## 2021-10-28 MED ORDER — HEPARIN SOD (PORK) LOCK FLUSH 100 UNIT/ML IV SOLN
500.0000 [IU] | Freq: Once | INTRAVENOUS | Status: AC | PRN
  Administered 2021-10-28: 11:00:00 500 [IU]

## 2021-10-28 MED ORDER — SODIUM CHLORIDE 0.9% FLUSH
10.0000 mL | Freq: Once | INTRAVENOUS | Status: AC
  Administered 2021-10-28: 09:00:00 10 mL

## 2021-10-28 MED ORDER — SODIUM CHLORIDE 0.9 % IV SOLN: Freq: Once | INTRAVENOUS | Status: AC

## 2021-10-28 MED ORDER — SODIUM CHLORIDE 0.9% FLUSH
10.0000 mL | INTRAVENOUS | Status: DC | PRN
  Administered 2021-10-28: 11:00:00 10 mL

## 2021-10-28 MED ORDER — PROCHLORPERAZINE MALEATE 10 MG PO TABS
10.0000 mg | ORAL_TABLET | Freq: Once | ORAL | Status: AC
  Administered 2021-10-28: 10:00:00 10 mg via ORAL
  Filled 2021-10-28: qty 1

## 2021-10-28 MED ORDER — SODIUM CHLORIDE 0.9 % IV SOLN
400.0000 mg/m2 | Freq: Once | INTRAVENOUS | Status: AC
  Administered 2021-10-28: 10:00:00 900 mg via INTRAVENOUS
  Filled 2021-10-28: qty 20

## 2021-10-28 NOTE — Patient Instructions (Signed)
[facility]  Discharge Instructions: Thank you for choosing [facility] to provide your oncology and hematology care.   If you have a lab appointment with the [facility], please go directly to the [facility] and check in at the registration area.   Wear comfortable clothing and clothing appropriate for easy access to any Portacath or PICC line.   We strive to give you quality time with your provider. You may need to reschedule your appointment if you arrive late (15 or more minutes).  Arriving late affects you and other patients whose appointments are after yours.  Also, if you miss three or more appointments without notifying the office, you may be dismissed from the clinic at the providers discretion.      For prescription refill requests, have your pharmacy contact our office and allow 72 hours for refills to be completed.    Today you received the following chemotherapy and/or immunotherapy agents: Alimta   To help prevent nausea and vomiting after your treatment, we encourage you to take your nausea medication as directed.  BELOW ARE SYMPTOMS THAT SHOULD BE REPORTED IMMEDIATELY: *FEVER GREATER THAN 100.4 F (38 C) OR HIGHER *CHILLS OR SWEATING *NAUSEA AND VOMITING THAT IS NOT CONTROLLED WITH YOUR NAUSEA MEDICATION *UNUSUAL SHORTNESS OF BREATH *UNUSUAL BRUISING OR BLEEDING *URINARY PROBLEMS (pain or burning when urinating, or frequent urination) *BOWEL PROBLEMS (unusual diarrhea, constipation, pain near the anus) TENDERNESS IN MOUTH AND THROAT WITH OR WITHOUT PRESENCE OF ULCERS (sore throat, sores in mouth, or a toothache) UNUSUAL RASH, SWELLING OR PAIN  UNUSUAL VAGINAL DISCHARGE OR ITCHING   Items with * indicate a potential emergency and should be followed up as soon as possible or go to the Emergency Department if any problems should occur.  Please show the CHEMOTHERAPY ALERT CARD or IMMUNOTHERAPY ALERT CARD at check-in to the  Emergency Department and triage nurse.  Should you have questions after your visit or need to cancel or reschedule your appointment, please contact [facility]  Dept: [phone]  and follow the prompts.  Office hours are 8:00 a.m. to 4:30 p.m. Monday - Friday. Please note that voicemails left after 4:00 p.m. may not be returned until the following business day.  We are closed weekends and major holidays. You have access to a nurse at all times for urgent questions. Please call the main number to the clinic Dept: [phone] and follow the prompts.   For any non-urgent questions, you may also contact your provider using MyChart. We now offer e-Visits for anyone 22 and older to request care online for non-urgent symptoms. For details visit [email or web address].   Also download the MyChart app! Go to the app store, search "MyChart", open the app, select [facility], and log in with your MyChart username and password.  Due to Covid, a mask is required upon entering the hospital/clinic. If you do not have a mask, one will be given to you upon arrival. For doctor visits, patients may have 1 support person aged 39 or older with them. For treatment visits, patients cannot have anyone with them due to current Covid guidelines and our immunocompromised population.

## 2021-11-01 DIAGNOSIS — D630 Anemia in neoplastic disease: Secondary | ICD-10-CM | POA: Diagnosis not present

## 2021-11-01 DIAGNOSIS — I480 Paroxysmal atrial fibrillation: Secondary | ICD-10-CM | POA: Diagnosis not present

## 2021-11-01 DIAGNOSIS — J9601 Acute respiratory failure with hypoxia: Secondary | ICD-10-CM | POA: Diagnosis not present

## 2021-11-01 DIAGNOSIS — G4733 Obstructive sleep apnea (adult) (pediatric): Secondary | ICD-10-CM | POA: Diagnosis not present

## 2021-11-01 DIAGNOSIS — I2510 Atherosclerotic heart disease of native coronary artery without angina pectoris: Secondary | ICD-10-CM | POA: Diagnosis not present

## 2021-11-01 DIAGNOSIS — C61 Malignant neoplasm of prostate: Secondary | ICD-10-CM | POA: Diagnosis not present

## 2021-11-01 DIAGNOSIS — G20 Parkinson's disease: Secondary | ICD-10-CM | POA: Diagnosis not present

## 2021-11-01 DIAGNOSIS — C3490 Malignant neoplasm of unspecified part of unspecified bronchus or lung: Secondary | ICD-10-CM | POA: Diagnosis not present

## 2021-11-01 DIAGNOSIS — J690 Pneumonitis due to inhalation of food and vomit: Secondary | ICD-10-CM | POA: Diagnosis not present

## 2021-11-03 ENCOUNTER — Inpatient Hospital Stay (HOSPITAL_COMMUNITY)
Admission: EM | Admit: 2021-11-03 | Discharge: 2021-11-20 | DRG: 871 | Disposition: A | Discharge status: Skilled Nursing Facility | Payer: Medicare PPO | Attending: Internal Medicine | Admitting: Internal Medicine

## 2021-11-03 ENCOUNTER — Emergency Department (HOSPITAL_COMMUNITY)
Admission: EM | Admit: 2021-11-03 | Discharge: 2021-11-03 | Disposition: A | Discharge status: Home / Self Care | Payer: Medicare PPO | Source: Home / Self Care | Attending: Emergency Medicine | Admitting: Emergency Medicine

## 2021-11-03 ENCOUNTER — Emergency Department (HOSPITAL_COMMUNITY): Payer: Medicare PPO

## 2021-11-03 ENCOUNTER — Other Ambulatory Visit: Payer: Self-pay

## 2021-11-03 ENCOUNTER — Encounter (HOSPITAL_COMMUNITY): Payer: Self-pay | Admitting: Emergency Medicine

## 2021-11-03 DIAGNOSIS — Z9981 Dependence on supplemental oxygen: Secondary | ICD-10-CM | POA: Diagnosis not present

## 2021-11-03 DIAGNOSIS — R000 Tachycardia, unspecified: Secondary | ICD-10-CM | POA: Diagnosis not present

## 2021-11-03 DIAGNOSIS — C3492 Malignant neoplasm of unspecified part of left bronchus or lung: Secondary | ICD-10-CM | POA: Diagnosis not present

## 2021-11-03 DIAGNOSIS — A419 Sepsis, unspecified organism: Secondary | ICD-10-CM | POA: Diagnosis not present

## 2021-11-03 DIAGNOSIS — I517 Cardiomegaly: Secondary | ICD-10-CM | POA: Diagnosis not present

## 2021-11-03 DIAGNOSIS — I480 Paroxysmal atrial fibrillation: Secondary | ICD-10-CM | POA: Diagnosis not present

## 2021-11-03 DIAGNOSIS — J9611 Chronic respiratory failure with hypoxia: Secondary | ICD-10-CM | POA: Diagnosis not present

## 2021-11-03 DIAGNOSIS — Z66 Do not resuscitate: Secondary | ICD-10-CM | POA: Diagnosis present

## 2021-11-03 DIAGNOSIS — Z8701 Personal history of pneumonia (recurrent): Secondary | ICD-10-CM

## 2021-11-03 DIAGNOSIS — R471 Dysarthria and anarthria: Secondary | ICD-10-CM | POA: Diagnosis not present

## 2021-11-03 DIAGNOSIS — I77810 Thoracic aortic ectasia: Secondary | ICD-10-CM | POA: Diagnosis present

## 2021-11-03 DIAGNOSIS — L8990 Pressure ulcer of unspecified site, unspecified stage: Secondary | ICD-10-CM | POA: Insufficient documentation

## 2021-11-03 DIAGNOSIS — I4892 Unspecified atrial flutter: Secondary | ICD-10-CM | POA: Diagnosis present

## 2021-11-03 DIAGNOSIS — Z923 Personal history of irradiation: Secondary | ICD-10-CM

## 2021-11-03 DIAGNOSIS — I10 Essential (primary) hypertension: Secondary | ICD-10-CM | POA: Diagnosis not present

## 2021-11-03 DIAGNOSIS — Z955 Presence of coronary angioplasty implant and graft: Secondary | ICD-10-CM

## 2021-11-03 DIAGNOSIS — Z79899 Other long term (current) drug therapy: Secondary | ICD-10-CM

## 2021-11-03 DIAGNOSIS — I2510 Atherosclerotic heart disease of native coronary artery without angina pectoris: Secondary | ICD-10-CM | POA: Diagnosis not present

## 2021-11-03 DIAGNOSIS — Z8249 Family history of ischemic heart disease and other diseases of the circulatory system: Secondary | ICD-10-CM

## 2021-11-03 DIAGNOSIS — Z6830 Body mass index (BMI) 30.0-30.9, adult: Secondary | ICD-10-CM

## 2021-11-03 DIAGNOSIS — I959 Hypotension, unspecified: Secondary | ICD-10-CM | POA: Diagnosis not present

## 2021-11-03 DIAGNOSIS — I872 Venous insufficiency (chronic) (peripheral): Secondary | ICD-10-CM | POA: Diagnosis not present

## 2021-11-03 DIAGNOSIS — D72819 Decreased white blood cell count, unspecified: Secondary | ICD-10-CM | POA: Diagnosis present

## 2021-11-03 DIAGNOSIS — Z87892 Personal history of anaphylaxis: Secondary | ICD-10-CM

## 2021-11-03 DIAGNOSIS — I6529 Occlusion and stenosis of unspecified carotid artery: Secondary | ICD-10-CM | POA: Diagnosis not present

## 2021-11-03 DIAGNOSIS — I5032 Chronic diastolic (congestive) heart failure: Secondary | ICD-10-CM | POA: Diagnosis present

## 2021-11-03 DIAGNOSIS — Z820 Family history of epilepsy and other diseases of the nervous system: Secondary | ICD-10-CM

## 2021-11-03 DIAGNOSIS — M79605 Pain in left leg: Secondary | ICD-10-CM

## 2021-11-03 DIAGNOSIS — I110 Hypertensive heart disease with heart failure: Secondary | ICD-10-CM | POA: Diagnosis present

## 2021-11-03 DIAGNOSIS — I6503 Occlusion and stenosis of bilateral vertebral arteries: Secondary | ICD-10-CM | POA: Diagnosis not present

## 2021-11-03 DIAGNOSIS — G20 Parkinson's disease: Secondary | ICD-10-CM | POA: Diagnosis present

## 2021-11-03 DIAGNOSIS — Z7189 Other specified counseling: Secondary | ICD-10-CM | POA: Diagnosis not present

## 2021-11-03 DIAGNOSIS — Z885 Allergy status to narcotic agent status: Secondary | ICD-10-CM

## 2021-11-03 DIAGNOSIS — D539 Nutritional anemia, unspecified: Secondary | ICD-10-CM | POA: Diagnosis present

## 2021-11-03 DIAGNOSIS — I4891 Unspecified atrial fibrillation: Secondary | ICD-10-CM | POA: Diagnosis not present

## 2021-11-03 DIAGNOSIS — I5031 Acute diastolic (congestive) heart failure: Secondary | ICD-10-CM | POA: Diagnosis not present

## 2021-11-03 DIAGNOSIS — M5416 Radiculopathy, lumbar region: Secondary | ICD-10-CM | POA: Diagnosis present

## 2021-11-03 DIAGNOSIS — D61810 Antineoplastic chemotherapy induced pancytopenia: Secondary | ICD-10-CM | POA: Diagnosis present

## 2021-11-03 DIAGNOSIS — I6522 Occlusion and stenosis of left carotid artery: Secondary | ICD-10-CM | POA: Diagnosis present

## 2021-11-03 DIAGNOSIS — I639 Cerebral infarction, unspecified: Secondary | ICD-10-CM | POA: Diagnosis not present

## 2021-11-03 DIAGNOSIS — I69328 Other speech and language deficits following cerebral infarction: Secondary | ICD-10-CM

## 2021-11-03 DIAGNOSIS — J440 Chronic obstructive pulmonary disease with acute lower respiratory infection: Secondary | ICD-10-CM | POA: Diagnosis present

## 2021-11-03 DIAGNOSIS — E871 Hypo-osmolality and hyponatremia: Secondary | ICD-10-CM | POA: Diagnosis present

## 2021-11-03 DIAGNOSIS — F419 Anxiety disorder, unspecified: Secondary | ICD-10-CM | POA: Diagnosis present

## 2021-11-03 DIAGNOSIS — I771 Stricture of artery: Secondary | ICD-10-CM | POA: Diagnosis not present

## 2021-11-03 DIAGNOSIS — Z7951 Long term (current) use of inhaled steroids: Secondary | ICD-10-CM

## 2021-11-03 DIAGNOSIS — D72825 Bandemia: Secondary | ICD-10-CM | POA: Diagnosis not present

## 2021-11-03 DIAGNOSIS — J189 Pneumonia, unspecified organism: Secondary | ICD-10-CM | POA: Diagnosis present

## 2021-11-03 DIAGNOSIS — J441 Chronic obstructive pulmonary disease with (acute) exacerbation: Secondary | ICD-10-CM | POA: Diagnosis present

## 2021-11-03 DIAGNOSIS — E8809 Other disorders of plasma-protein metabolism, not elsewhere classified: Secondary | ICD-10-CM | POA: Diagnosis present

## 2021-11-03 DIAGNOSIS — E669 Obesity, unspecified: Secondary | ICD-10-CM | POA: Diagnosis present

## 2021-11-03 DIAGNOSIS — T451X5A Adverse effect of antineoplastic and immunosuppressive drugs, initial encounter: Secondary | ICD-10-CM | POA: Diagnosis present

## 2021-11-03 DIAGNOSIS — H5461 Unqualified visual loss, right eye, normal vision left eye: Secondary | ICD-10-CM | POA: Diagnosis present

## 2021-11-03 DIAGNOSIS — Z801 Family history of malignant neoplasm of trachea, bronchus and lung: Secondary | ICD-10-CM

## 2021-11-03 DIAGNOSIS — I6782 Cerebral ischemia: Secondary | ICD-10-CM | POA: Diagnosis not present

## 2021-11-03 DIAGNOSIS — R4701 Aphasia: Secondary | ICD-10-CM | POA: Diagnosis not present

## 2021-11-03 DIAGNOSIS — G9389 Other specified disorders of brain: Secondary | ICD-10-CM | POA: Diagnosis not present

## 2021-11-03 DIAGNOSIS — L8991 Pressure ulcer of unspecified site, stage 1: Secondary | ICD-10-CM | POA: Diagnosis not present

## 2021-11-03 DIAGNOSIS — C787 Secondary malignant neoplasm of liver and intrahepatic bile duct: Secondary | ICD-10-CM | POA: Diagnosis present

## 2021-11-03 DIAGNOSIS — C3432 Malignant neoplasm of lower lobe, left bronchus or lung: Secondary | ICD-10-CM | POA: Diagnosis present

## 2021-11-03 DIAGNOSIS — Z888 Allergy status to other drugs, medicaments and biological substances status: Secondary | ICD-10-CM

## 2021-11-03 DIAGNOSIS — Z981 Arthrodesis status: Secondary | ICD-10-CM

## 2021-11-03 DIAGNOSIS — I69398 Other sequelae of cerebral infarction: Secondary | ICD-10-CM

## 2021-11-03 DIAGNOSIS — Z87891 Personal history of nicotine dependence: Secondary | ICD-10-CM

## 2021-11-03 DIAGNOSIS — Z96652 Presence of left artificial knee joint: Secondary | ICD-10-CM | POA: Diagnosis present

## 2021-11-03 DIAGNOSIS — R5381 Other malaise: Secondary | ICD-10-CM

## 2021-11-03 DIAGNOSIS — Z20822 Contact with and (suspected) exposure to covid-19: Secondary | ICD-10-CM | POA: Diagnosis present

## 2021-11-03 DIAGNOSIS — G319 Degenerative disease of nervous system, unspecified: Secondary | ICD-10-CM | POA: Diagnosis not present

## 2021-11-03 DIAGNOSIS — G8929 Other chronic pain: Secondary | ICD-10-CM | POA: Diagnosis present

## 2021-11-03 DIAGNOSIS — Z7901 Long term (current) use of anticoagulants: Secondary | ICD-10-CM

## 2021-11-03 DIAGNOSIS — G4733 Obstructive sleep apnea (adult) (pediatric): Secondary | ICD-10-CM | POA: Diagnosis present

## 2021-11-03 DIAGNOSIS — J449 Chronic obstructive pulmonary disease, unspecified: Secondary | ICD-10-CM | POA: Diagnosis not present

## 2021-11-03 DIAGNOSIS — J910 Malignant pleural effusion: Secondary | ICD-10-CM | POA: Diagnosis present

## 2021-11-03 DIAGNOSIS — E785 Hyperlipidemia, unspecified: Secondary | ICD-10-CM | POA: Diagnosis present

## 2021-11-03 DIAGNOSIS — Z880 Allergy status to penicillin: Secondary | ICD-10-CM

## 2021-11-03 DIAGNOSIS — I5033 Acute on chronic diastolic (congestive) heart failure: Secondary | ICD-10-CM | POA: Diagnosis not present

## 2021-11-03 DIAGNOSIS — M545 Low back pain, unspecified: Secondary | ICD-10-CM | POA: Diagnosis present

## 2021-11-03 DIAGNOSIS — M7989 Other specified soft tissue disorders: Secondary | ICD-10-CM | POA: Diagnosis not present

## 2021-11-03 DIAGNOSIS — D701 Agranulocytosis secondary to cancer chemotherapy: Secondary | ICD-10-CM | POA: Diagnosis not present

## 2021-11-03 DIAGNOSIS — I672 Cerebral atherosclerosis: Secondary | ICD-10-CM | POA: Diagnosis not present

## 2021-11-03 DIAGNOSIS — R059 Cough, unspecified: Secondary | ICD-10-CM | POA: Diagnosis not present

## 2021-11-03 DIAGNOSIS — E869 Volume depletion, unspecified: Secondary | ICD-10-CM | POA: Diagnosis present

## 2021-11-03 DIAGNOSIS — R9431 Abnormal electrocardiogram [ECG] [EKG]: Secondary | ICD-10-CM

## 2021-11-03 DIAGNOSIS — R29818 Other symptoms and signs involving the nervous system: Secondary | ICD-10-CM | POA: Diagnosis not present

## 2021-11-03 DIAGNOSIS — R531 Weakness: Secondary | ICD-10-CM | POA: Diagnosis not present

## 2021-11-03 DIAGNOSIS — Z515 Encounter for palliative care: Secondary | ICD-10-CM | POA: Diagnosis not present

## 2021-11-03 DIAGNOSIS — K219 Gastro-esophageal reflux disease without esophagitis: Secondary | ICD-10-CM | POA: Diagnosis present

## 2021-11-03 DIAGNOSIS — I6523 Occlusion and stenosis of bilateral carotid arteries: Secondary | ICD-10-CM | POA: Diagnosis not present

## 2021-11-03 DIAGNOSIS — Z8546 Personal history of malignant neoplasm of prostate: Secondary | ICD-10-CM

## 2021-11-03 DIAGNOSIS — M1990 Unspecified osteoarthritis, unspecified site: Secondary | ICD-10-CM | POA: Diagnosis present

## 2021-11-03 HISTORY — DX: Parkinson's disease without dyskinesia, without mention of fluctuations: G20.A1

## 2021-11-03 HISTORY — DX: Malignant neoplasm of unspecified part of unspecified bronchus or lung: C34.90

## 2021-11-03 HISTORY — DX: Parkinson's disease: G20

## 2021-11-03 HISTORY — DX: Anemia, unspecified: D64.9

## 2021-11-03 HISTORY — DX: Unspecified atrial fibrillation: I48.91

## 2021-11-03 LAB — LACTIC ACID, PLASMA
Lactic Acid, Venous: 1.1 mmol/L (ref 0.5–1.9)
Lactic Acid, Venous: 0.9 mmol/L (ref 0.5–1.9)

## 2021-11-03 LAB — RESP PANEL BY RT-PCR (FLU A&B, COVID) ARPGX2
Influenza A by PCR: NEGATIVE
Influenza B by PCR: NEGATIVE
SARS Coronavirus 2 by RT PCR: NEGATIVE

## 2021-11-03 LAB — COMPREHENSIVE METABOLIC PANEL
ALT: 18 U/L (ref 0–44)
AST: 25 U/L (ref 15–41)
Albumin: 2.7 g/dL — ABNORMAL LOW (ref 3.5–5.0)
Alkaline Phosphatase: 45 U/L (ref 38–126)
Anion gap: 9 (ref 5–15)
BUN: 17 mg/dL (ref 8–23)
CO2: 22 mmol/L (ref 22–32)
Calcium: 8.8 mg/dL — ABNORMAL LOW (ref 8.9–10.3)
Chloride: 97 mmol/L — ABNORMAL LOW (ref 98–111)
Creatinine, Ser: 0.91 mg/dL (ref 0.61–1.24)
GFR, Estimated: >60 mL/min (ref >60)
Glucose, Bld: 98 mg/dL (ref 70–99)
Potassium: 4 mmol/L (ref 3.5–5.1)
Sodium: 128 mmol/L — ABNORMAL LOW (ref 135–145)
Total Bilirubin: 0.8 mg/dL (ref 0.3–1.2)
Total Protein: 6.7 g/dL (ref 6.5–8.1)

## 2021-11-03 LAB — URINALYSIS, ROUTINE W REFLEX MICROSCOPIC
Bilirubin Urine: NEGATIVE
Glucose, UA: NEGATIVE mg/dL
Hgb urine dipstick: NEGATIVE
Ketones, ur: NEGATIVE mg/dL
Leukocytes,Ua: NEGATIVE
Nitrite: NEGATIVE
Protein, ur: NEGATIVE mg/dL
Specific Gravity, Urine: 1.015 (ref 1.005–1.030)
pH: 5 (ref 5.0–8.0)

## 2021-11-03 LAB — CBC WITH DIFFERENTIAL/PLATELET
Abs Immature Granulocytes: 0.13 10*3/uL — ABNORMAL HIGH (ref 0.00–0.07)
Basophils Absolute: 0 10*3/uL (ref 0.0–0.1)
Basophils Relative: 1 %
Eosinophils Absolute: 0.1 10*3/uL (ref 0.0–0.5)
Eosinophils Relative: 2 %
HCT: 28.4 % — ABNORMAL LOW (ref 39.0–52.0)
Hemoglobin: 9.5 g/dL — ABNORMAL LOW (ref 13.0–17.0)
Immature Granulocytes: 5 %
Lymphocytes Relative: 7 %
Lymphs Abs: 0.2 10*3/uL — ABNORMAL LOW (ref 0.7–4.0)
MCH: 34.2 pg — ABNORMAL HIGH (ref 26.0–34.0)
MCHC: 33.5 g/dL (ref 30.0–36.0)
MCV: 102.2 fL — ABNORMAL HIGH (ref 80.0–100.0)
Monocytes Absolute: 0.2 10*3/uL (ref 0.1–1.0)
Monocytes Relative: 9 %
Neutro Abs: 2.2 10*3/uL (ref 1.7–7.7)
Neutrophils Relative %: 76 %
Platelets: 158 10*3/uL (ref 150–400)
RBC: 2.78 MIL/uL — ABNORMAL LOW (ref 4.22–5.81)
RDW: 14.6 % (ref 11.5–15.5)
WBC: 2.8 10*3/uL — ABNORMAL LOW (ref 4.0–10.5)
nRBC: 0 % (ref 0.0–0.2)

## 2021-11-03 LAB — PROTIME-INR
INR: 1.7 — ABNORMAL HIGH (ref 0.8–1.2)
Prothrombin Time: 19.6 seconds — ABNORMAL HIGH (ref 11.4–15.2)

## 2021-11-03 LAB — CBG MONITORING, ED: Glucose-Capillary: 102 mg/dL — ABNORMAL HIGH (ref 70–99)

## 2021-11-03 LAB — BRAIN NATRIURETIC PEPTIDE: B Natriuretic Peptide: 187.8 pg/mL — ABNORMAL HIGH (ref 0.0–100.0)

## 2021-11-03 LAB — APTT: aPTT: 47 seconds — ABNORMAL HIGH (ref 24–36)

## 2021-11-03 LAB — MRSA NEXT GEN BY PCR, NASAL: MRSA by PCR Next Gen: NOT DETECTED

## 2021-11-03 LAB — PROCALCITONIN: Procalcitonin: 0.33 ng/mL

## 2021-11-03 LAB — MAGNESIUM: Magnesium: 1.8 mg/dL (ref 1.7–2.4)

## 2021-11-03 MED ORDER — HYDROCOD POLI-CHLORPHE POLI ER 10-8 MG/5ML PO SUER
5.0000 mL | Freq: Two times a day (BID) | ORAL | Status: DC | PRN
  Administered 2021-11-03 – 2021-11-09 (×8): 5 mL via ORAL
  Filled 2021-11-03 (×8): qty 5

## 2021-11-03 MED ORDER — ARFORMOTEROL TARTRATE 15 MCG/2ML IN NEBU
15.0000 ug | INHALATION_SOLUTION | Freq: Two times a day (BID) | RESPIRATORY_TRACT | Status: DC
  Administered 2021-11-03: 15 ug via RESPIRATORY_TRACT
  Filled 2021-11-03: qty 2

## 2021-11-03 MED ORDER — PROBIOTIC 250 MG PO CAPS: 250.0000 mg | ORAL_CAPSULE | Freq: Every day | ORAL | Status: DC

## 2021-11-03 MED ORDER — FENOFIBRATE 160 MG PO TABS
160.0000 mg | ORAL_TABLET | Freq: Every day | ORAL | Status: DC
  Administered 2021-11-03 – 2021-11-20 (×18): 160 mg via ORAL
  Filled 2021-11-03 (×21): qty 1

## 2021-11-03 MED ORDER — IPRATROPIUM BROMIDE 0.03 % NA SOLN
2.0000 | Freq: Every morning | NASAL | Status: DC
  Administered 2021-11-04 – 2021-11-20 (×16): 2 via NASAL
  Filled 2021-11-03: qty 30

## 2021-11-03 MED ORDER — VANCOMYCIN HCL IN DEXTROSE 1-5 GM/200ML-% IV SOLN
1000.0000 mg | Freq: Once | INTRAVENOUS | Status: DC
  Filled 2021-11-03: qty 200

## 2021-11-03 MED ORDER — LACTATED RINGERS IV BOLUS
1000.0000 mL | Freq: Once | INTRAVENOUS | Status: AC
  Administered 2021-11-03: 1000 mL via INTRAVENOUS

## 2021-11-03 MED ORDER — DOFETILIDE 250 MCG PO CAPS: 500.0000 ug | ORAL_CAPSULE | Freq: Two times a day (BID) | ORAL | Status: DC

## 2021-11-03 MED ORDER — BUDESONIDE 0.5 MG/2ML IN SUSP
0.5000 mg | Freq: Two times a day (BID) | RESPIRATORY_TRACT | Status: DC
  Administered 2021-11-03 – 2021-11-20 (×35): 0.5 mg via RESPIRATORY_TRACT
  Filled 2021-11-03 (×35): qty 2

## 2021-11-03 MED ORDER — CEFEPIME HCL 2 G IJ SOLR
2.0000 g | Freq: Three times a day (TID) | INTRAMUSCULAR | Status: AC
  Administered 2021-11-03 – 2021-11-10 (×21): 2 g via INTRAVENOUS
  Filled 2021-11-03 (×21): qty 2

## 2021-11-03 MED ORDER — PANTOPRAZOLE SODIUM 40 MG PO TBEC
40.0000 mg | DELAYED_RELEASE_TABLET | Freq: Every day | ORAL | Status: DC
  Administered 2021-11-03 – 2021-11-20 (×18): 40 mg via ORAL
  Filled 2021-11-03 (×18): qty 1

## 2021-11-03 MED ORDER — LACTATED RINGERS IV BOLUS (SEPSIS)
1000.0000 mL | Freq: Once | INTRAVENOUS | Status: AC
  Administered 2021-11-03: 1000 mL via INTRAVENOUS

## 2021-11-03 MED ORDER — NITROGLYCERIN 0.4 MG SL SUBL: 0.4000 mg | SUBLINGUAL_TABLET | SUBLINGUAL | Status: DC | PRN

## 2021-11-03 MED ORDER — ALPRAZOLAM 0.5 MG PO TABS
0.5000 mg | ORAL_TABLET | Freq: Every evening | ORAL | Status: DC | PRN
  Administered 2021-11-04 – 2021-11-20 (×13): 0.5 mg via ORAL
  Filled 2021-11-03 (×14): qty 1

## 2021-11-03 MED ORDER — DOFETILIDE 250 MCG PO CAPS
500.0000 ug | ORAL_CAPSULE | Freq: Two times a day (BID) | ORAL | Status: DC
  Administered 2021-11-03: 500 ug via ORAL
  Filled 2021-11-03 (×3): qty 2

## 2021-11-03 MED ORDER — SODIUM CHLORIDE 0.9 % IV SOLN
2.0000 g | Freq: Once | INTRAVENOUS | Status: AC
  Administered 2021-11-03: 2 g via INTRAVENOUS
  Filled 2021-11-03: qty 2

## 2021-11-03 MED ORDER — GUAIFENESIN ER 600 MG PO TB12
1200.0000 mg | ORAL_TABLET | Freq: Two times a day (BID) | ORAL | Status: DC
  Administered 2021-11-03 – 2021-11-20 (×34): 1200 mg via ORAL
  Filled 2021-11-03 (×34): qty 2

## 2021-11-03 MED ORDER — SODIUM CHLORIDE 0.9 % IV SOLN
500.0000 mg | Freq: Once | INTRAVENOUS | Status: AC
  Administered 2021-11-03: 500 mg via INTRAVENOUS
  Filled 2021-11-03: qty 5

## 2021-11-03 MED ORDER — LEVALBUTEROL HCL 0.63 MG/3ML IN NEBU
0.6300 mg | INHALATION_SOLUTION | Freq: Two times a day (BID) | RESPIRATORY_TRACT | Status: DC
  Administered 2021-11-03: 0.63 mg via RESPIRATORY_TRACT
  Filled 2021-11-03: qty 3

## 2021-11-03 MED ORDER — ALBUTEROL SULFATE (2.5 MG/3ML) 0.083% IN NEBU
2.5000 mg | INHALATION_SOLUTION | RESPIRATORY_TRACT | Status: DC | PRN
  Administered 2021-11-08 – 2021-11-09 (×2): 2.5 mg via RESPIRATORY_TRACT
  Filled 2021-11-03 (×2): qty 3

## 2021-11-03 MED ORDER — RISAQUAD PO CAPS
1.0000 | ORAL_CAPSULE | Freq: Every day | ORAL | Status: DC
  Administered 2021-11-03 – 2021-11-20 (×18): 1 via ORAL
  Filled 2021-11-03 (×18): qty 1

## 2021-11-03 MED ORDER — LIP MEDEX EX OINT
TOPICAL_OINTMENT | CUTANEOUS | Status: DC | PRN
  Administered 2021-11-03: 75 via TOPICAL
  Filled 2021-11-03: qty 7

## 2021-11-03 MED ORDER — ACETAMINOPHEN 650 MG RE SUPP: 650.0000 mg | Freq: Four times a day (QID) | RECTAL | Status: DC | PRN

## 2021-11-03 MED ORDER — LACTATED RINGERS IV SOLN: INTRAVENOUS | Status: AC

## 2021-11-03 MED ORDER — MAGNESIUM OXIDE -MG SUPPLEMENT 400 (240 MG) MG PO TABS
400.0000 mg | ORAL_TABLET | Freq: Every day | ORAL | Status: DC
  Administered 2021-11-03 – 2021-11-04 (×2): 400 mg via ORAL
  Filled 2021-11-03 (×2): qty 1

## 2021-11-03 MED ORDER — RIVAROXABAN 20 MG PO TABS
20.0000 mg | ORAL_TABLET | Freq: Every evening | ORAL | Status: DC
  Administered 2021-11-03 – 2021-11-09 (×7): 20 mg via ORAL
  Filled 2021-11-03 (×8): qty 1

## 2021-11-03 MED ORDER — TAMSULOSIN HCL 0.4 MG PO CAPS
0.4000 mg | ORAL_CAPSULE | Freq: Every day | ORAL | Status: DC
  Administered 2021-11-03 – 2021-11-20 (×18): 0.4 mg via ORAL
  Filled 2021-11-03 (×18): qty 1

## 2021-11-03 MED ORDER — SODIUM CHLORIDE 0.9% FLUSH: 3.0000 mL | INTRAVENOUS | Status: DC | PRN

## 2021-11-03 MED ORDER — BENZONATATE 100 MG PO CAPS
200.0000 mg | ORAL_CAPSULE | Freq: Three times a day (TID) | ORAL | Status: DC | PRN
  Administered 2021-11-03 – 2021-11-20 (×24): 200 mg via ORAL
  Filled 2021-11-03 (×25): qty 2

## 2021-11-03 MED ORDER — SODIUM CHLORIDE 0.9% FLUSH
3.0000 mL | Freq: Two times a day (BID) | INTRAVENOUS | Status: DC
  Administered 2021-11-03 – 2021-11-15 (×25): 3 mL via INTRAVENOUS

## 2021-11-03 MED ORDER — FOLIC ACID 1 MG PO TABS
1.0000 mg | ORAL_TABLET | Freq: Every day | ORAL | Status: DC
  Administered 2021-11-03 – 2021-11-20 (×18): 1 mg via ORAL
  Filled 2021-11-03 (×18): qty 1

## 2021-11-03 MED ORDER — POLYETHYLENE GLYCOL 3350 17 G PO PACK: 17.0000 g | PACK | Freq: Every day | ORAL | Status: DC | PRN

## 2021-11-03 MED ORDER — METOPROLOL TARTRATE 5 MG/5ML IV SOLN
5.0000 mg | Freq: Once | INTRAVENOUS | Status: AC
  Administered 2021-11-03: 5 mg via INTRAVENOUS
  Filled 2021-11-03: qty 5

## 2021-11-03 MED ORDER — CARBIDOPA-LEVODOPA 25-100 MG PO TABS
1.0000 | ORAL_TABLET | ORAL | Status: DC
  Administered 2021-11-03 – 2021-11-20 (×67): 1 via ORAL
  Filled 2021-11-03 (×70): qty 1

## 2021-11-03 MED ORDER — ATORVASTATIN CALCIUM 40 MG PO TABS
40.0000 mg | ORAL_TABLET | Freq: Every day | ORAL | Status: DC
  Administered 2021-11-03 – 2021-11-20 (×16): 40 mg via ORAL
  Filled 2021-11-03 (×17): qty 1

## 2021-11-03 MED ORDER — ALBUTEROL SULFATE HFA 108 (90 BASE) MCG/ACT IN AERS
2.0000 | INHALATION_SPRAY | RESPIRATORY_TRACT | Status: DC | PRN
  Filled 2021-11-03: qty 6.7

## 2021-11-03 MED ORDER — VANCOMYCIN HCL 1750 MG/350ML IV SOLN
1750.0000 mg | INTRAVENOUS | Status: DC
  Administered 2021-11-04: 1750 mg via INTRAVENOUS
  Filled 2021-11-03: qty 350

## 2021-11-03 MED ORDER — SODIUM CHLORIDE 0.9 % IV SOLN: 250.0000 mL | INTRAVENOUS | Status: DC | PRN

## 2021-11-03 MED ORDER — CARBINOXAMINE MALEATE 6 MG PO TABS
6.0000 mg | ORAL_TABLET | Freq: Two times a day (BID) | ORAL | Status: DC
  Administered 2021-11-04 – 2021-11-20 (×33): 6 mg via ORAL
  Filled 2021-11-03 (×3): qty 1

## 2021-11-03 MED ORDER — METOPROLOL SUCCINATE ER 25 MG PO TB24
25.0000 mg | ORAL_TABLET | Freq: Every day | ORAL | Status: DC
  Administered 2021-11-03 – 2021-11-05 (×3): 25 mg via ORAL
  Filled 2021-11-03 (×3): qty 1

## 2021-11-03 MED ORDER — VANCOMYCIN HCL 2000 MG/400ML IV SOLN
2000.0000 mg | Freq: Once | INTRAVENOUS | Status: AC
  Administered 2021-11-03: 2000 mg via INTRAVENOUS
  Filled 2021-11-03: qty 400

## 2021-11-03 MED ORDER — POTASSIUM CHLORIDE ER 10 MEQ PO TBCR
10.0000 meq | EXTENDED_RELEASE_TABLET | Freq: Every day | ORAL | Status: DC
  Administered 2021-11-03: 10 meq via ORAL
  Filled 2021-11-03 (×3): qty 1

## 2021-11-03 MED ORDER — DILTIAZEM HCL-DEXTROSE 125-5 MG/125ML-% IV SOLN (PREMIX)
5.0000 mg/h | INTRAVENOUS | Status: DC
  Administered 2021-11-03: 8 mg/h via INTRAVENOUS
  Administered 2021-11-03: 5 mg/h via INTRAVENOUS
  Administered 2021-11-04 – 2021-11-06 (×5): 15 mg/h via INTRAVENOUS
  Administered 2021-11-06: 10 mg/h via INTRAVENOUS
  Administered 2021-11-07: 15 mg/h via INTRAVENOUS
  Administered 2021-11-10: 5 mg/h via INTRAVENOUS
  Administered 2021-11-11 (×2): 10 mg/h via INTRAVENOUS
  Administered 2021-11-12 – 2021-11-13 (×2): 7.5 mg/h via INTRAVENOUS
  Administered 2021-11-14: 10 mg/h via INTRAVENOUS
  Filled 2021-11-03 (×17): qty 125

## 2021-11-03 MED ORDER — UMECLIDINIUM BROMIDE 62.5 MCG/ACT IN AEPB
1.0000 | INHALATION_SPRAY | Freq: Every day | RESPIRATORY_TRACT | Status: DC
  Filled 2021-11-03: qty 7

## 2021-11-03 MED ORDER — ACETAMINOPHEN 325 MG PO TABS
650.0000 mg | ORAL_TABLET | Freq: Four times a day (QID) | ORAL | Status: DC | PRN
  Administered 2021-11-04 – 2021-11-18 (×3): 650 mg via ORAL
  Filled 2021-11-03 (×3): qty 2

## 2021-11-03 MED ORDER — CHLORHEXIDINE GLUCONATE CLOTH 2 % EX PADS
6.0000 | MEDICATED_PAD | Freq: Every day | CUTANEOUS | Status: DC
  Administered 2021-11-03 – 2021-11-20 (×18): 6 via TOPICAL

## 2021-11-03 MED ORDER — DILTIAZEM LOAD VIA INFUSION
20.0000 mg | Freq: Once | INTRAVENOUS | Status: AC
  Administered 2021-11-03: 20 mg via INTRAVENOUS
  Filled 2021-11-03: qty 20

## 2021-11-03 MED ORDER — TOPIRAMATE 100 MG PO TABS
100.0000 mg | ORAL_TABLET | Freq: Two times a day (BID) | ORAL | Status: DC
Start: 2021-11-03 — End: 2021-11-21
  Administered 2021-11-03 – 2021-11-20 (×33): 100 mg via ORAL
  Filled 2021-11-03 (×34): qty 1

## 2021-11-03 MED ORDER — MAGNESIUM SULFATE 2 GM/50ML IV SOLN
2.0000 g | Freq: Once | INTRAVENOUS | Status: AC
  Administered 2021-11-03: 2 g via INTRAVENOUS
  Filled 2021-11-03: qty 50

## 2021-11-03 MED ORDER — FLUTICASONE PROPIONATE 50 MCG/ACT NA SUSP
2.0000 | Freq: Every day | NASAL | Status: DC
  Administered 2021-11-03 – 2021-11-20 (×18): 2 via NASAL
  Filled 2021-11-03: qty 16

## 2021-11-03 NOTE — Sepsis Progress Note (Signed)
Code Sepsis protocol being monitored by eLink. 

## 2021-11-03 NOTE — Progress Notes (Signed)
A consult was received from an ED physician for vanc/cefepime per pharmacy dosing.  The patient's profile has been reviewed for ht/wt/allergies/indication/available labs.   A one time order has been placed for vanc 2g and cefepime 2g.  Further antibiotics/pharmacy consults should be ordered by admitting physician if indicated.                       Thank you, Kara Mead 11/03/2021  12:17 PM

## 2021-11-03 NOTE — ED Provider Notes (Signed)
Corey [name]   CSN: [id number] Arrival date & time: 11/03/21  1009     History  Chief Complaint  Patient presents with   Cough    Corey [name] is a 76 y.o. male.  HPI    Patient has history of non-small cell lung cancer on chemotherapy, last session 6 days ago.  Pt presents to the ED with complaints of SOB and cough. States this began on Friday and has been progressively worsening. The patient reports that he had pneumonia in October and his symptoms feel similar to this time. The cough is productive with yellow sputum worse in the morning. He reports SOB at rest and with activity, he is on 4L of oxygen at baseline and is on this today.   He has a hx of non-small cell lung cancer dx June 2022 and restarted chemotherapy last week. Denies fever but endorses chills recently. Denies worsening LE edema. reports some L sided CP that only occurs when he is coughing.   Patient also noted to be tachycardic intermittently.  He has history of paroxysmal A. fib and missed his morning Tikosyn.   Home Medications Prior to Admission medications   Medication Sig Start Date End Date Taking? Authorizing Provider  acetaminophen (TYLENOL) 325 MG tablet Take 2 tablets (650 mg total) by mouth every 6 (six) hours as needed for mild pain (or Fever >/= 101). 08/25/21  Yes Angiulli, Lavon Paganini, PA-C  albuterol (PROVENTIL) (2.5 MG/3ML) 0.083% nebulizer solution Inhale 3 mLs into the lungs every 4 (four) hours as needed for wheezing or shortness of breath. Patient taking differently: Inhale 3 mLs into the lungs See admin instructions. Nebulize 2.5 mg & inhale into the lungs 3-4 times a day and may also use an additional 2.5 mg up to two times a day as needed for shortness of breath 08/28/21  Yes Angiulli, Lavon Paganini, PA-C  ALPRAZolam Duanne Moron) 0.5 MG tablet Take 1 tablet (0.5 mg total) by mouth at bedtime as needed for anxiety. Patient taking differently: Take 0.5 mg  by mouth at bedtime as needed for anxiety or sleep. 08/25/21  Yes Angiulli, Lavon Paganini, PA-C  atorvastatin (LIPITOR) 40 MG tablet Take 1 tablet (40 mg total) by mouth daily. Patient taking differently: Take 40 mg by mouth at bedtime. 08/25/21  Yes Angiulli, Lavon Paganini, PA-C  benzonatate (TESSALON) 200 MG capsule Take 1 capsule (200 mg total) by mouth 3 (three) times daily. Patient taking differently: Take 200 mg by mouth in the morning, at noon, and at bedtime. 10/07/21  Yes Heilingoetter, Cassandra L, PA-C  budesonide (PULMICORT) 0.5 MG/2ML nebulizer solution Take 2 mLs (0.5 mg total) by nebulization 2 (two) times daily. Patient taking differently: Take 0.5 mg by nebulization in the morning and at bedtime. 09/25/21  Yes Icard, Bradley L, DO  carbidopa-levodopa (SINEMET IR) 25-100 MG tablet Take 1 tablet by mouth 3 (three) times daily. Patient taking differently: Take 1 tablet by mouth See admin instructions. Take 1 tablet by mouth at 8 AM, 12 PM, 4 PM, and bedtime 07/09/21  Yes Tat, Eustace Quail, DO  chlorpheniramine-HYDROcodone (TUSSIONEX) 10-8 MG/5ML SUER Take 5 mLs by mouth every 12 (twelve) hours as needed for cough. Patient taking differently: Take 5 mLs by mouth at bedtime. 08/25/21  Yes Angiulli, Lavon Paganini, PA-C  dofetilide (TIKOSYN) 500 MCG capsule Take 1 capsule (500 mcg total) by mouth 2 (two) times daily. Please keep upcoming appointment in February for future refills. Thank you Patient taking  differently: Take 500 mcg by mouth 2 (two) times daily. 08/25/21  Yes Angiulli, Lavon Paganini, PA-C  fenofibrate 160 MG tablet TAKE 1 TABLET DAILY. PLEASE KEEP UPCOMING APPT IN MARCH WITH DR. Acie Fredrickson BEFORE ANYMORE REFILLS. Patient taking differently: Take 160 mg by mouth See admin instructions. Take 160 mg by mouth at 2 PM daily 08/25/21  Yes Angiulli, Lavon Paganini, PA-C  fluticasone (FLONASE) 50 MCG/ACT nasal spray Place 2 sprays into both nostrils daily. 08/25/21  Yes Angiulli, Lavon Paganini, PA-C  folic acid (FOLVITE) 1  MG tablet Take 1 tablet (1 mg total) by mouth daily. 08/25/21  Yes Angiulli, Lavon Paganini, PA-C  guaiFENesin (MUCINEX) 600 MG 12 hr tablet Take 2 tablets (1,200 mg total) by mouth 2 (two) times daily. 08/12/21  Yes Kathie Dike, MD  ipratropium (ATROVENT) 0.03 % nasal spray Place 1-2 sprays into both nostrils every 12 (twelve) hours. For drainage Patient taking differently: Place 2 sprays into both nostrils in the morning. 08/28/21  Yes Angiulli, Lavon Paganini, PA-C  levalbuterol (XOPENEX) 0.63 MG/3ML nebulizer solution Take 3 mLs (0.63 mg total) by nebulization 2 (two) times daily. 09/25/21  Yes Icard, Octavio Graves, DO  loratadine (CLARITIN) 10 MG tablet Take 1 tablet (10 mg total) by mouth daily as needed for allergies (nasal drainage). Patient taking differently: Take 10 mg by mouth daily. 08/25/21  Yes Angiulli, Lavon Paganini, PA-C  magnesium oxide (MAG-OX) 400 (240 Mg) MG tablet Take 1 tablet (400 mg total) by mouth daily. 08/25/21  Yes Angiulli, Lavon Paganini, PA-C  metoprolol succinate (TOPROL-XL) 25 MG 24 hr tablet Take 1 tablet (25 mg total) by mouth daily. 08/25/21  Yes Angiulli, Lavon Paganini, PA-C  Multiple Vitamins-Minerals (MULTIVITAMINS THER. W/MINERALS) TABS Take 1 tablet by mouth daily with breakfast.   Yes [provider]  nitroGLYCERIN (NITROSTAT) 0.4 MG SL tablet Place 1 tablet (0.4 mg total) under the tongue every 5 (five) minutes as needed for chest pain. 08/25/21  Yes Angiulli, Lavon Paganini, PA-C  omeprazole (PRILOSEC) 20 MG capsule Take 1 capsule (20 mg total) by mouth daily. Patient taking differently: Take 20 mg by mouth daily before breakfast. 08/25/21  Yes Angiulli, Lavon Paganini, PA-C  ondansetron (ZOFRAN) 8 MG tablet Take 8 mg by mouth every 8 (eight) hours as needed for nausea or vomiting.   Yes [provider]  polyethylene glycol (MIRALAX / GLYCOLAX) 17 g packet Take 17 g by mouth daily. Patient taking differently: Take 17 g by mouth daily as needed for mild constipation (MIX AS  DIRECTED AND DRINK). 08/25/21  Yes Angiulli, Lavon Paganini, PA-C  potassium chloride (KLOR-CON) 10 MEQ tablet TAKE 1 TABLET BY MOUTH EVERY DAY Patient taking differently: Take 10 mEq by mouth See admin instructions. Take 10 mEq by mouth at 2 PM daily 07/16/21  Yes Nahser, Wonda Cheng, MD  rivaroxaban (XARELTO) 20 MG TABS tablet Take 1 tablet (20 mg total) by mouth daily. Patient taking differently: Take 20 mg by mouth every evening. 08/25/21  Yes Angiulli, Daniel J, PA-C  RYVENT 6 MG TABS TAKE 1 TABLET BY MOUTH 2 (TWO) TIMES DAILY AS NEEDED (DRAINAGE). Patient taking differently: Take 6 mg by mouth in the morning and at bedtime. 10/29/21  Yes Garnet Sierras, DO  Saccharomyces boulardii (PROBIOTIC) 250 MG CAPS Take 250 mg by mouth daily.   Yes [provider]  tamsulosin (FLOMAX) 0.4 MG CAPS capsule Take 1 capsule (0.4 mg total) by mouth daily after supper. 08/25/21  Yes Angiulli, Lavon Paganini, PA-C  Tiotropium  Bromide-Olodaterol (STIOLTO RESPIMAT) 2.5-2.5 MCG/ACT AERS Inhale 2 puffs into the lungs daily. 09/25/21  Yes Icard, Bradley L, DO  topiramate (TOPAMAX) 50 MG tablet Take 2 tablets (100 mg total) by mouth 2 (two) times daily. 08/25/21  Yes Angiulli, Lavon Paganini, PA-C  traMADol (ULTRAM) 50 MG tablet Take 1 tablet (50 mg total) by mouth every 6 (six) hours as needed for moderate pain. 08/25/21  Yes Angiulli, Lavon Paganini, PA-C  Vitamin D, Ergocalciferol, (DRISDOL) 1.25 MG (50000 UNIT) CAPS capsule Take 1 capsule (50,000 Units total) by mouth every 7 (seven) days. Monday Patient taking differently: Take 50,000 Units by mouth every Monday. 10/05/21  Yes Brunetta Genera, MD  ipratropium (ATROVENT) 0.02 % nebulizer solution Take 2.5 mLs (0.5 mg total) by nebulization 2 (two) times daily. Patient not taking: Reported on 11/03/2021 08/28/21   Angiulli, Lavon Paganini, PA-C  traZODone (DESYREL) 50 MG tablet Take 1 tablet (50 mg total) by mouth at bedtime. Patient not taking: Reported on 11/03/2021 08/25/21   Cathlyn Parsons, PA-C      Allergies    Amoxicillin, Fosinopril, Hydromorphone, Ampicillin, Codeine, Monopril [fosinopril sodium], and Rosuvastatin    Review of Systems   Review of Systems  Constitutional:  Positive for activity change.  Respiratory:  Positive for shortness of breath.   All other systems reviewed and are negative.  Physical Exam Updated Vital Signs BP (!) 106/91    Pulse (!) 157    Temp 98.8 F (37.1 C) (Oral)    Resp 14    SpO2 95%  Physical Exam Vitals and nursing [name] reviewed.  Constitutional:      Appearance: He is well-developed.  HENT:     Head: Atraumatic.  Cardiovascular:     Rate and Rhythm: Tachycardia present.  Pulmonary:     Effort: Pulmonary effort is normal.     Breath sounds: Rhonchi present.     Comments: Left-sided rhonchorous breath sounds in the lower field Musculoskeletal:     Cervical back: Neck supple.     Left lower leg: Edema present.  Skin:    General: Skin is warm.     Findings: Erythema present.  Neurological:     Mental Status: He is alert and oriented to person, place, and time.    ED Results / Procedures / Treatments   Labs (all labs ordered are listed, but only abnormal results are displayed) Labs Reviewed  COMPREHENSIVE METABOLIC PANEL - Abnormal; Notable for the following components:      Result Value   Sodium 128 (*)    Chloride 97 (*)    Calcium 8.8 (*)    Albumin 2.7 (*)    All other components within normal limits  CBC WITH DIFFERENTIAL/PLATELET - Abnormal; Notable for the following components:   WBC 2.8 (*)    RBC 2.78 (*)    Hemoglobin 9.5 (*)    HCT 28.4 (*)    MCV 102.2 (*)    MCH 34.2 (*)    Lymphs Abs 0.2 (*)    Abs Immature Granulocytes 0.13 (*)    All other components within normal limits  URINALYSIS, ROUTINE W REFLEX MICROSCOPIC - Abnormal; Notable for the following components:   Color, Urine AMBER (*)    All other components within normal limits  PROTIME-INR - Abnormal; Notable for the following  components:   Prothrombin Time 19.6 (*)    INR 1.7 (*)    All other components within normal limits  APTT - Abnormal; Notable for the following  components:   aPTT 47 (*)    All other components within normal limits  CBG MONITORING, ED - Abnormal; Notable for the following components:   Glucose-Capillary 102 (*)    All other components within normal limits  RESP PANEL BY RT-PCR (FLU A&B, COVID) ARPGX2  CULTURE, BLOOD (ROUTINE X 2)  CULTURE, BLOOD (ROUTINE X 2)  LACTIC ACID, PLASMA  LACTIC ACID, PLASMA  MAGNESIUM  PROCALCITONIN    EKG EKG Interpretation  Date/Time:  Tuesday November 03 2021 12:54:38 EST Ventricular Rate:  162 PR Interval:    QRS Duration: 89 QT Interval:  320 QTC Calculation: 526 R Axis:   64 Text Interpretation: Atrial fibrillation with rapid V-rate Abnormal R-wave progression, late transition Borderline T abnormalities, anterior leads af with rvr Confirmed by Varney Biles [phone]) on 11/03/2021 3:31:00 PM  Radiology DG Chest 2 View  Result Date: 11/03/2021 CLINICAL DATA:  Cough EXAM: CHEST - 2 VIEW COMPARISON:  CT chest 10/15/2021 FINDINGS: Cardiomegaly and mediastinum appear unchanged. Calcified plaques in the aortic arch. Left-sided atrial loop recorder. Right-sided central venous port with the tip in the region of the SVC. Heart and mediastinum are deviated to the left similar to previous. Extensive irregular opacities throughout the left lung again seen with relative sparing in the upper lung zone, similar to previous. Chronic changes in the right lung base. No significant pleural effusion. No pneumothorax visualized. IMPRESSION: No significant change since previous study including extensive irregular opacities in the left lung. Electronically Signed   By: Ofilia Neas M.D.   On: 11/03/2021 10:55   VAS [place] LOWER EXTREMITY VENOUS (DVT) (7a-7p)  Result Date: 11/03/2021  Lower Venous DVT Study Patient Name:  Corey [name]  Date of Exam:   11/03/2021 Medical  Rec #: [id number]    Accession #:    [id number] Date of Birth: Jul 09, 1946   Patient Gender: M Patient Age:   19 years Exam Location:  [facility] Procedure:      VAS [place] LOWER EXTREMITY VENOUS (DVT) Referring Phys: Thelma Comp Saliyah Gillin --------------------------------------------------------------------------------  Indications: Pain.  Risk Factors: None identified. Comparison Study: No prior studies. Performing Technologist: Oliver Hum RVT  Examination Guidelines: A complete evaluation includes B-mode imaging, spectral Doppler, color Doppler, and power Doppler as needed of all accessible portions of each vessel. Bilateral testing is considered an integral part of a complete examination. Limited examinations for reoccurring indications may be performed as noted. The reflux portion of the exam is performed with the patient in reverse Trendelenburg.  +-----+---------------+---------+-----------+----------+--------------+  RIGHT Compressibility Phasicity Spontaneity Properties Thrombus Aging  +-----+---------------+---------+-----------+----------+--------------+  CFV   Full            Yes       Yes                                    +-----+---------------+---------+-----------+----------+--------------+   +---------+---------------+---------+-----------+----------+--------------+  LEFT      Compressibility Phasicity Spontaneity Properties Thrombus Aging  +---------+---------------+---------+-----------+----------+--------------+  CFV       Full            Yes       Yes                                    +---------+---------------+---------+-----------+----------+--------------+  SFJ       Full                                                             +---------+---------------+---------+-----------+----------+--------------+  FV Prox   Full                                                             +---------+---------------+---------+-----------+----------+--------------+  FV Mid    Full                                                              +---------+---------------+---------+-----------+----------+--------------+  FV Distal Full                                                             +---------+---------------+---------+-----------+----------+--------------+  PFV       Full                                                             +---------+---------------+---------+-----------+----------+--------------+  POP       Full            Yes       Yes                                    +---------+---------------+---------+-----------+----------+--------------+  PTV       Full                                                             +---------+---------------+---------+-----------+----------+--------------+  PERO      Full                                                             +---------+---------------+---------+-----------+----------+--------------+     Summary: RIGHT: - No evidence of common femoral vein obstruction.  LEFT: - There is no evidence of deep vein thrombosis in the lower extremity.  - No cystic structure found in the popliteal fossa.  *See table(s) above for measurements and observations.    Preliminary     Procedures .Critical Care Performed by: Varney Biles, MD Authorized by: Varney Biles, MD   Critical care provider statement:    Critical care time (minutes):  52   Critical care was necessary to treat or prevent imminent or life-threatening deterioration of the following conditions:  Circulatory failure   Critical care was time spent personally by me on the following activities:  Development of treatment plan with patient or surrogate,  discussions with consultants, evaluation of patient's response to treatment, examination of patient, ordering and review of laboratory studies, ordering and review of radiographic studies, ordering and performing treatments and interventions, pulse oximetry, re-evaluation of patient's condition and review of old charts    Medications  Ordered in ED Medications  albuterol (VENTOLIN HFA) 108 (90 Base) MCG/ACT inhaler 2 puff (has no administration in time range)  lactated ringers infusion ( Intravenous New Bag/Given 11/03/21 1308)  diltiazem (CARDIZEM) 1 mg/mL load via infusion 20 mg (20 mg Intravenous Bolus from Bag 11/03/21 1445)    And  diltiazem (CARDIZEM) 125 mg in dextrose 5% 125 mL (1 mg/mL) infusion (5 mg/hr Intravenous New Bag/Given 11/03/21 1447)  dofetilide (TIKOSYN) capsule 500 mcg (has no administration in time range)  sodium chloride flush (NS) 0.9 % injection 3 mL (has no administration in time range)  sodium chloride flush (NS) 0.9 % injection 3 mL (has no administration in time range)  0.9 %  sodium chloride infusion (has no administration in time range)  acetaminophen (TYLENOL) tablet 650 mg (has no administration in time range)    Or  acetaminophen (TYLENOL) suppository 650 mg (has no administration in time range)  ceFEPIme (MAXIPIME) 2 g in sodium chloride 0.9 % 100 mL IVPB (has no administration in time range)  lactated ringers bolus 1,000 mL (0 mLs Intravenous Stopped 11/03/21 1348)  ceFEPIme (MAXIPIME) 2 g in sodium chloride 0.9 % 100 mL IVPB (2 g Intravenous Bolus 11/03/21 1241)  azithromycin (ZITHROMAX) 500 mg in sodium chloride 0.9 % 250 mL IVPB (0 mg Intravenous Stopped 11/03/21 1423)  vancomycin (VANCOREADY) IVPB 2000 mg/400 mL (0 mg Intravenous Stopped 11/03/21 1518)    ED Course/ Medical Decision Making/ A&P                           Medical Decision Making Amount and/or Complexity of Data Reviewed Labs: ordered. Radiology: ordered. ECG/medicine tests: ordered.  Risk Prescription drug management.   This patient presents to the ED with chief complaint(s) of worsening cough, weakness with pertinent past medical history of non-small cell lung cancer on chemotherapy which further complicates the presenting complaint. The complaint involves an extensive differential diagnosis and treatment options  and also carries with it a high risk of complications and morbidity.    The differential diagnosis includes acute symptomatic anemia, malignant pleural effusion, pneumonia, PE, postobstructive pneumonia.  Patient also has erythematous left lower extremity.  Differential for that includes DVT, cellulitis.  The initial plan is to get basic labs, chest x-ray and reassess.   Additional history obtained: Additional history obtained from spouse Records reviewed previous admission documents and oncology document  Reassessment and review: Lab Tests: I Ordered, and personally interpreted labs.  The pertinent results include: No evidence of acute anemia.  Patient does however have leukopenia and bandemia -heightening suspicion for occult infection.  His DVT study is negative.  Suspicion is high for cellulitis, patient had cellulitis in the past over the same extremity.  Imaging Studies ordered: I independently visualized and interpreted the following imaging X-ray indicates persistent left lower lung field opacity . I agree with the radiologist interpretation   Cardiac Monitoring: The patient was maintained on a cardiac monitor.  I personally viewed and interpreted the cardiac monitor which showed an underlying rhythm of:  Atrial Fibrillation  Medicines ordered and prescription drug management: I ordered the following medications diltiazem for A. fib with RVR. IV antibiotics, broad-spectrum for suspected underlying infectious  process.  Reevaluation of the patient after these medicines showed that the patient    stayed the same  Consultations Obtained: I requested consultation with the admitting physician -hospitalist , and discussed  findings as well as pertinent plan - they recommend: Admission to the hospital for optimization.  Complexity of problems addressed: Patients presentation is most consistent with  acute presentation with potential threat to life or bodily function and severe  exacerbation of chronic illness During patient's assessment  Disposition: After consideration of the diagnostic results and the patients response to treatment,  I feel that the patent would benefit from admission to the hospital. .   Final Clinical Impression(s) / ED Diagnoses Final diagnoses:  Paroxysmal atrial fibrillation ([place])  Bandemia    Rx / DC Orders ED Discharge Orders          Ordered    Amb referral to [facility]        11/03/21 [place], [place], MD 11/03/21 1539

## 2021-11-03 NOTE — ED Notes (Signed)
MD Olevia Bowens at bedside

## 2021-11-03 NOTE — Progress Notes (Signed)
Pharmacy: Dofetilide (Tikosyn) - Follow Up Assessment and Electrolyte Replacement  Pharmacy consulted to assist in monitoring and replacing electrolytes in this 76 y.o. male admitted on 11/03/2021 whose prior to admission dofetilide has been continued.   Labs:    Component Value Date/Time   K 4.0 11/03/2021 1208   MG 1.8 11/03/2021 1208     Plan: Potassium: K >/= 4: No additional supplementation needed  Magnesium: Mg 1.8-2: Give Mg 2 gm IV x1     Thank you for allowing pharmacy to participate in this patient's care   Royetta Asal, PharmD, [place] Please utilize Amion for appropriate phone number to reach the unit pharmacist ([place]) 11/03/2021 4:06 PM

## 2021-11-03 NOTE — H&P (Signed)
History and Physical    Corey [name] UQJ:[id number] DOB: 06-30-1946 DOA: 11/03/2021  PCP: [name], MD   Patient coming from: Home.  I have personally briefly reviewed patient's old medical records in [place]  Chief Complaint: Generalized weakness and cough.  HPI: Corey [name] is a 76 y.o. 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  He last received chemotherapy 6 days ago.  Positive chills, but no subjective fever or night sweats.  His appetite is decreased.  Denied wheezing or hemoptysis.  No palpitations dizziness, diaphoresis, PND or worsening lower extremity edema.  No abdominal pain, but has felt nauseous, no melena or hematochezia.  No diarrhea or constipation.  No dysuria, frequency or hematuria.  ED Course: Initial vital signs were temperature 98.8 F, pulse 9019, respiration 18, BP 130/95 mmHg O2 sat 98% on 4 L nasal cannula.  The patient received azithromycin 500 mg IVPB, cefepime 2 g IVPB, LR 1000 mL bolus, vancomycin 2000 mg IV, diltiazem 10 mg IVP x1 dose and was started on diltiazem infusion.  I added another 1000 mL of LR bolus, magnesium sulfate 2 g IVPB and metoprolol 5 mg IVPB.  Lab work: CBC is her white count 2.8, hemoglobin 9.5 g/dL with an MCV of 102.2 fL and platelets 158.  PT 19.6, INR 1.7 and PTT 47.  CMP showed sodium 128 and chloride 97 mmol/L.  Albumin was 2.7 g/dL.  The rest of the CMP measurements were unremarkable once  calcium is corrected to albumin level.  Lactic acid was normal twice.  Procalcitonin 0.33 ng/L.  BNP 888 pg/mL.  Urinalysis was unremarkable.  Review of Systems: As per HPI otherwise all other systems reviewed and are negative.  Past Medical History:  Diagnosis Date   Anemia    Anxiety    Arthritis    "back, right knee, hands, ankles, neck" (05/31/2016)   Carotid artery disease ([place]) 08/30/2016   Carotid US 11/19: R 1-39, L 40-59 // Carotid US 08/2019: R 1-39; L 40-59 // Carotid US 11/21: R 1-39; L 40-59>> repeat 1 year    Chronic lower back pain    Coronary atherosclerosis of native coronary artery    a. BMS to [facility] 2004 and 2007, otherwise mild nonobstructive disease. EF normal.   Diverticulitis    Dyslipidemia    Dyspnea    Essential hypertension, benign    Family history of breast cancer    Family history of lung cancer    Family history of prostate cancer    GERD (gastroesophageal reflux disease)    Lumbar radiculopathy, chronic 02/04/2015   Right L5   Obesity    OSA on CPAP    uses cpap   Paroxysmal atrial fibrillation ([place])    a. Discovered after stroke.   Pneumonia 01/1996   PONV (postoperative nausea and vomiting)    Prostate CA ([place])    Recurrent upper respiratory infection (URI)    Stroke ([place]) 07/2012   no deficits   Visit for monitoring Tikosyn therapy 09/18/2019    Past Surgical History:  Procedure Laterality Date   ADENOIDECTOMY  ANTERIOR CERVICAL DECOMP/DISCECTOMY FUSION  07/2001; 10/2002   "C5-6; C6-7; redo"   BACK SURGERY     CARPAL TUNNEL RELEASE Left 10/2015   CHEST TUBE INSERTION Left 03/17/2021   Procedure: INSERTION PLEURAL DRAINAGE CATHETER;  Surgeon: Garner Nash, DO;  Location: [facility];  Service: Pulmonary;  Laterality: Left;  Indwelling Tunneled Pleural catheter (PLEUREX)    COLONOSCOPY W/ POLYPECTOMY  02/2014   CORONARY ANGIOPLASTY WITH STENT PLACEMENT  05/2003; 12/2005   "mid RCA; mid RCA"   FINE NEEDLE ASPIRATION  02/26/2021    Procedure: FINE NEEDLE ASPIRATION (FNA) LINEAR;  Surgeon: Candee Furbish, MD;  Location: [facility] ENDOSCOPY;  Service: Pulmonary;;   HAND SURGERY  02/2019   LEFT HAND   implantable loop recorder placement  02/27/2020   [employer] Reveal [place] model [id number]  implantable loop recorder implanted by Dr Rayann Heman for afib management and evaluation of presyncope   IR IMAGING GUIDED PORT INSERTION  04/16/2021   JOINT REPLACEMENT     KNEE ARTHROSCOPY Left 10/2005   KNEE ARTHROSCOPY W/ PARTIAL MEDIAL MENISCECTOMY Left 09/2005   LUMBAR LAMINECTOMY/DECOMPRESSION MICRODISCECTOMY  03/2005   "L4-5"   POSTERIOR LUMBAR FUSION  10/2003   L5-S1; "plates, screws"   SHOULDER ARTHROSCOPY Right 08/2011   Debridement of labrum, arthroscopic distal clavicle excision   SHOULDER OPEN ROTATOR CUFF REPAIR Left 07/2014   TEE WITHOUT CARDIOVERSION  07/07/2012   Procedure: TRANSESOPHAGEAL ECHOCARDIOGRAM (TEE);  Surgeon: Fay Records, MD;  Location[facility] END[facility]COPY;  Service: Cardiovascular;  Laterality: N/A;   THORACENTESIS N/A 02/25/2021   Procedure: Mathews Robinsons;  Surgeon: Juanito Doom, MD;  Location: [facility];  Service: Cardiopulmonary;  Laterality: N/A;   TONSILLECTOMY AND ADENOIDECTOMY  ~ 1956   TOTAL KNEE ARTHROPLASTY Left 10/2006   TRIGGER FINGER RELEASE Left 10/2015   VIDEO BRONCHOSCOPY WITH ENDOBRONCHIAL ULTRASOUND Left 02/26/2021   Procedure: VIDEO BRONCHOSCOPY WITH ENDOBRONCHIAL ULTRASOUND;  Surgeon: Candee Furbish, MD;  Location: [facility] ENDOSCOPY;  Service: Pulmonary;  Laterality: Left;  cryoprobe too thanks!   Social History  reports that he quit smoking about 18 years ago. His smoking use included cigarettes. He has a 34.00 pack-year smoking history. He has never been exposed to tobacco smoke. He has never used smokeless tobacco. He reports that he does not drink alcohol and does not use drugs.  Allergies  Allergen Reactions   Amoxicillin Anaphylaxis and Swelling    Tolerates Rocephin   Fosinopril Other  (See Comments) and Cough    Muscle aches   Hydromorphone Nausea And Vomiting   Ampicillin Rash   Codeine Nausea Only and Other (See Comments)    Heavy amounts cause nausea and stomach upset   Monopril [Fosinopril Sodium] Other (See Comments)    Muscle aches & pain   Rosuvastatin Other (See Comments)    Joint pain    Family History  Problem Relation Age of Onset   Hypertension Mother    Heart disease Father    Heart attack Father    Prostate cancer Brother 64   Parkinson's disease Brother    Lung cancer Paternal Aunt        hx of smoking   Breast cancer Paternal Grandmother        dx 87s, bilateral mastectomies   Heart attack Paternal Grandfather    Blindness Son    Colon cancer Neg Hx    Pancreatic cancer Neg Hx    Prior to Admission medications   Medication Sig Start Date End Date Taking? Authorizing Provider  acetaminophen (TYLENOL) 325 MG tablet Take 2 tablets (650 mg total) by mouth every 6 (six) hours as needed for mild pain (or Fever >/= 101). 08/25/21  Yes Angiulli, Lavon Paganini, PA-C  albuterol (PROVENTIL) (2.5 MG/3ML) 0.083% nebulizer solution Inhale 3 mLs into the lungs every 4 (four) hours as needed for wheezing or shortness of breath. Patient taking differently: Inhale 3 mLs into the lungs See admin instructions. Nebulize 2.5 mg & inhale into the lungs 3-4 times a day and may also use an additional 2.5 mg up to two times a day as needed for shortness of breath 08/28/21  Yes Angiulli, Lavon Paganini, PA-C  ALPRAZolam Duanne Moron) 0.5 MG tablet Take 1 tablet (0.5 mg total) by mouth at bedtime as needed for anxiety. Patient taking differently: Take 0.5 mg by mouth at bedtime as needed for anxiety or sleep. 08/25/21  Yes Angiulli, Lavon Paganini, PA-C  atorvastatin (LIPITOR) 40 MG tablet Take 1 tablet (40 mg total) by mouth daily. Patient taking differently: Take 40 mg by mouth at bedtime. 08/25/21  Yes Angiulli, Lavon Paganini, PA-C  benzonatate (TESSALON) 200 MG capsule Take 1 capsule (200 mg  total) by mouth 3 (three) times daily. Patient taking differently: Take 200 mg by mouth in the morning, at noon, and at bedtime. 10/07/21  Yes Heilingoetter, Cassandra L, PA-C  budesonide (PULMICORT) 0.5 MG/2ML nebulizer solution Take 2 mLs (0.5 mg total) by nebulization 2 (two) times daily. Patient taking differently: Take 0.5 mg by nebulization in the morning and at bedtime. 09/25/21  Yes Icard, Bradley L, DO  carbidopa-levodopa (SINEMET IR) 25-100 MG tablet Take 1 tablet by mouth 3 (three) times daily. Patient taking differently: Take 1 tablet by mouth See admin instructions. Take 1 tablet by mouth at 8 AM, 12 PM, 4 PM, and bedtime 07/09/21  Yes Tat, Eustace Quail, DO  chlorpheniramine-HYDROcodone (TUSSIONEX) 10-8 MG/5ML SUER Take 5 mLs by mouth every 12 (twelve) hours as needed for cough. Patient taking differently: Take 5 mLs by mouth at bedtime. 08/25/21  Yes Angiulli, Lavon Paganini, PA-C  dofetilide (TIKOSYN) 500 MCG capsule Take 1 capsule (500 mcg total) by mouth 2 (two) times daily. Please keep upcoming appointment in February for future refills. Thank you Patient taking differently: Take 500 mcg by mouth 2 (two) times daily. 08/25/21  Yes Angiulli, Lavon Paganini, PA-C  fenofibrate 160 MG tablet TAKE 1 TABLET DAILY. PLEASE KEEP UPCOMING APPT IN MARCH WITH DR. Acie Fredrickson BEFORE ANYMORE REFILLS. Patient taking differently: Take 160 mg by mouth See admin instructions. Take 160 mg by mouth at 2 PM daily 08/25/21  Yes Angiulli, Lavon Paganini, PA-C  fluticasone (FLONASE) 50 MCG/ACT nasal spray Place 2 sprays into both nostrils daily. 08/25/21  Yes Angiulli, Lavon Paganini, PA-C  folic acid (FOLVITE) 1 MG tablet Take 1 tablet (1 mg total) by mouth daily. 08/25/21  Yes Angiulli, Lavon Paganini, PA-C  guaiFENesin (MUCINEX) 600 MG 12 hr tablet Take 2 tablets (1,200 mg total) by mouth 2 (two) times daily. 08/12/21  Yes Kathie Dike, MD  ipratropium (ATROVENT) 0.03 % nasal spray Place 1-2 sprays into both nostrils every 12 (twelve) hours.  For drainage Patient taking differently: Place 2 sprays into both nostrils in the morning. 08/28/21  Yes Angiulli, Lavon Paganini, PA-C  levalbuterol (XOPENEX) 0.63 MG/3ML nebulizer solution Take 3 mLs (0.63 mg total) by nebulization 2 (two) times daily. 09/25/21  Yes Icard, Octavio Graves, DO  loratadine (CLARITIN) 10 MG tablet Take 1 tablet (10 mg total) by mouth daily as needed for  allergies (nasal drainage). Patient taking differently: Take 10 mg by mouth daily. 08/25/21  Yes Angiulli, Lavon Paganini, PA-C  magnesium oxide (MAG-OX) 400 (240 Mg) MG tablet Take 1 tablet (400 mg total) by mouth daily. 08/25/21  Yes Angiulli, Lavon Paganini, PA-C  metoprolol succinate (TOPROL-XL) 25 MG 24 hr tablet Take 1 tablet (25 mg total) by mouth daily. 08/25/21  Yes Angiulli, Lavon Paganini, PA-C  Multiple Vitamins-Minerals (MULTIVITAMINS THER. W/MINERALS) TABS Take 1 tablet by mouth daily with breakfast.   Yes [provider]  nitroGLYCERIN (NITROSTAT) 0.4 MG SL tablet Place 1 tablet (0.4 mg total) under the tongue every 5 (five) minutes as needed for chest pain. 08/25/21  Yes Angiulli, Lavon Paganini, PA-C  omeprazole (PRILOSEC) 20 MG capsule Take 1 capsule (20 mg total) by mouth daily. Patient taking differently: Take 20 mg by mouth daily before breakfast. 08/25/21  Yes Angiulli, Lavon Paganini, PA-C  ondansetron (ZOFRAN) 8 MG tablet Take 8 mg by mouth every 8 (eight) hours as needed for nausea or vomiting.   Yes [provider]  polyethylene glycol (MIRALAX / GLYCOLAX) 17 g packet Take 17 g by mouth daily. Patient taking differently: Take 17 g by mouth daily as needed for mild constipation (MIX AS DIRECTED AND DRINK). 08/25/21  Yes Angiulli, Lavon Paganini, PA-C  potassium chloride (KLOR-CON) 10 MEQ tablet TAKE 1 TABLET BY MOUTH EVERY DAY Patient taking differently: Take 10 mEq by mouth See admin instructions. Take 10 mEq by mouth at 2 PM daily 07/16/21  Yes Nahser, Wonda Cheng, MD  rivaroxaban (XARELTO) 20 MG TABS tablet Take 1 tablet  (20 mg total) by mouth daily. Patient taking differently: Take 20 mg by mouth every evening. 08/25/21  Yes Angiulli, Daniel J, PA-C  RYVENT 6 MG TABS TAKE 1 TABLET BY MOUTH 2 (TWO) TIMES DAILY AS NEEDED (DRAINAGE). Patient taking differently: Take 6 mg by mouth in the morning and at bedtime. 10/29/21  Yes Garnet Sierras, DO  Saccharomyces boulardii (PROBIOTIC) 250 MG CAPS Take 250 mg by mouth daily.   Yes [provider]  tamsulosin (FLOMAX) 0.4 MG CAPS capsule Take 1 capsule (0.4 mg total) by mouth daily after supper. 08/25/21  Yes Angiulli, Lavon Paganini, PA-C  Tiotropium Bromide-Olodaterol (STIOLTO RESPIMAT) 2.5-2.5 MCG/ACT AERS Inhale 2 puffs into the lungs daily. 09/25/21  Yes Icard, Bradley L, DO  topiramate (TOPAMAX) 50 MG tablet Take 2 tablets (100 mg total) by mouth 2 (two) times daily. 08/25/21  Yes Angiulli, Lavon Paganini, PA-C  traMADol (ULTRAM) 50 MG tablet Take 1 tablet (50 mg total) by mouth every 6 (six) hours as needed for moderate pain. 08/25/21  Yes Angiulli, Lavon Paganini, PA-C  Vitamin D, Ergocalciferol, (DRISDOL) 1.25 MG (50000 UNIT) CAPS capsule Take 1 capsule (50,000 Units total) by mouth every 7 (seven) days. Monday Patient taking differently: Take 50,000 Units by mouth every Monday. 10/05/21  Yes Brunetta Genera, MD  ipratropium (ATROVENT) 0.02 % nebulizer solution Take 2.5 mLs (0.5 mg total) by nebulization 2 (two) times daily. Patient not taking: Reported on 11/03/2021 08/28/21   Angiulli, Lavon Paganini, PA-C  traZODone (DESYREL) 50 MG tablet Take 1 tablet (50 mg total) by mouth at bedtime. Patient not taking: Reported on 11/03/2021 08/25/21   Cathlyn Parsons, PA-C   Physical Exam: Vitals:   11/03/21 1241 11/03/21 1300 11/03/21 1330 11/03/21 1400  BP: 105/68 102/71 113/83 (!) 106/91  Pulse: (!) 111 89 (!) 109 (!) 157  Resp: 18 (!) 27 (!) 24 14  Temp:  TempSrc:      SpO2: 100% 100% 99% 95%   Constitutional: Chronically ill-appearing.  NAD, calm, comfortable Eyes: PERRL,  lids and conjunctivae normal ENMT: Mucous membranes are moist. Posterior pharynx clear of any exudate or lesions. Neck: normal, supple, no masses, no thyromegaly Respiratory: clear to auscultation bilaterally, no wheezing, no crackles. Normal respiratory effort. No accessory muscle use.  Cardiovascular: Irregularly irregular with RVR in the 130s, no murmurs / rubs / gallops. No extremity edema. 2+ pedal pulses. No carotid bruits.  Abdomen: Obese, no distention.  Soft, no tenderness, no masses palpated. No hepatosplenomegaly. Bowel sounds positive.  Musculoskeletal: no clubbing / cyanosis.Good ROM, no contractures. Normal muscle tone.  Skin: no rashes, lesions, ulcers. No induration Neurologic: CN 2-12 grossly intact. Sensation intact, DTR normal. Strength 5/5 in all 4.  Psychiatric: Normal judgment and insight. Alert and oriented x 3. Normal mood.   Labs on Admission: I have personally reviewed following labs and imaging studies  CBC: Recent Labs  Lab 10/28/21 0845 11/03/21 1208  WBC 9.3 2.8*  NEUTROABS 7.7 2.2  HGB 10.4* 9.5*  HCT 32.6* 28.4*  MCV 106.2* 102.2*  PLT 259 161   Basic Metabolic Panel: Recent Labs  Lab 10/28/21 0845 11/03/21 1208  NA 137 128*  K 3.7 4.0  CL 105 97*  CO2 25 22  GLUCOSE 118* 98  BUN 24* 17  CREATININE 0.88 0.91  CALCIUM 9.2 8.8*  MG  --  1.8   GFR: Estimated Creatinine Clearance: 90.1 mL/min (by C-G formula based on SCr of 0.91 mg/dL).  Liver Function Tests: Recent Labs  Lab 10/28/21 0845 11/03/21 1208  AST 17 25  ALT 12 18  ALKPHOS 51 45  BILITOT 0.3 0.8  PROT 6.7 6.7  ALBUMIN 3.5 2.7*   Urine analysis:    Component Value Date/Time   COLORURINE AMBER (A) 11/03/2021 1229   APPEARANCEUR CLEAR 11/03/2021 1229   LABSPEC 1.015 11/03/2021 1229   PHURINE 5.0 11/03/2021 1229   GLUCOSEU NEGATIVE 11/03/2021 1229   HGBUR NEGATIVE 11/03/2021 1229   BILIRUBINUR NEGATIVE 11/03/2021 1229   KETONESUR NEGATIVE 11/03/2021 1229   PROTEINUR  NEGATIVE 11/03/2021 1229   NITRITE NEGATIVE 11/03/2021 1229   LEUKOCYTESUR NEGATIVE 11/03/2021 1229   Radiological Exams on Admission: DG Chest 2 View  Result Date: 11/03/2021 CLINICAL DATA:  Cough EXAM: CHEST - 2 VIEW COMPARISON:  CT chest 10/15/2021 FINDINGS: Cardiomegaly and mediastinum appear unchanged. Calcified plaques in the aortic arch. Left-sided atrial loop recorder. Right-sided central venous port with the tip in the region of the SVC. Heart and mediastinum are deviated to the left similar to previous. Extensive irregular opacities throughout the left lung again seen with relative sparing in the upper lung zone, similar to previous. Chronic changes in the right lung base. No significant pleural effusion. No pneumothorax visualized. IMPRESSION: No significant change since previous study including extensive irregular opacities in the left lung. Electronically Signed   By: Ofilia Neas M.D.   On: 11/03/2021 10:55   VAS [place] LOWER EXTREMITY VENOUS (DVT) (7a-7p)  Result Date: 11/03/2021  Lower Venous DVT Study Patient Name:  XSAVIER SEELEY  Date of Exam:   11/03/2021 Medical Rec #: [id number]    Accession #:    [id number] Date of Birth: Dec 20, 1945   Patient Gender: M Patient Age:   77 years Exam Location[facility]  [facility] Procedure:      VAS [place] LOWER EXTREMITY VENOUS (DVT) Referring Phys: Thelma Comp NANAVATI --------------------------------------------------------------------------------  Indications: Pain.  Risk Factors:  None identified. Comparison Study: No prior studies. Performing Technologist: Oliver Hum RVT  Examination Guidelines: A complete evaluation includes B-mode imaging, spectral Doppler, color Doppler, and power Doppler as needed of all accessible portions of each vessel. Bilateral testing is considered an integral part of a complete examination. Limited examinations for reoccurring indications may be performed as noted. The reflux portion of the exam is performed with the patient  in reverse Trendelenburg.  +-----+---------------+---------+-----------+----------+--------------+  RIGHT Compressibility Phasicity Spontaneity Properties Thrombus Aging  +-----+---------------+---------+-----------+----------+--------------+  CFV   Full            Yes       Yes                                    +-----+---------------+---------+-----------+----------+--------------+   +---------+---------------+---------+-----------+----------+--------------+  LEFT      Compressibility Phasicity Spontaneity Properties Thrombus Aging  +---------+---------------+---------+-----------+----------+--------------+  CFV       Full            Yes       Yes                                    +---------+---------------+---------+-----------+----------+--------------+  SFJ       Full                                                             +---------+---------------+---------+-----------+----------+--------------+  FV Prox   Full                                                             +---------+---------------+---------+-----------+----------+--------------+  FV Mid    Full                                                             +---------+---------------+---------+-----------+----------+--------------+  FV Distal Full                                                             +---------+---------------+---------+-----------+----------+--------------+  PFV       Full                                                             +---------+---------------+---------+-----------+----------+--------------+  POP       Full            Yes       Yes                                    +---------+---------------+---------+-----------+----------+--------------+  PTV       Full                                                             +---------+---------------+---------+-----------+----------+--------------+  PERO      Full                                                              +---------+---------------+---------+-----------+----------+--------------+    Summary: RIGHT: - No evidence of common femoral vein obstruction.  LEFT: - There is no evidence of deep vein thrombosis in the lower extremity.  - No cystic structure found in the popliteal fossa.  *See table(s) above for measurements and observations. Electronically signed by Deitra Mayo MD on 11/03/2021 at 3:47:23 PM.    Final     EKG: Independently reviewed.  Vent. rate 162 BPM PR interval * ms QRS duration 89 ms QT/QTcB 320/526 ms P-R-T axes * 64 53 Atrial fibrillation with rapid V-rate Abnormal R-wave progression, late transition Borderline T abnormalities, anterior leads  Assessment/Plan Principal Problem:   Atrial fibrillation with RVR (HCC) Admit to inpatient/stepdown. Supplemental oxygen as needed. Continue diltiazem infusion. Magnesium sulfate 2 g IVPB. Continue magnesium oxide 400 mg p.o. twice daily. Keep electrolytes optimized. Continue Toprol-XL 25 mg p.o. daily. Continue Tikosyn per pharmacy. Continue Xarelto 20 mg p.o. daily.  Active Problems:   CAP (community acquired pneumonia) Continue cefepime per pharmacy. Continue vancomycin per pharmacy. Check strep pneumoniae urinary antigen. Check sputum gram stain, culture and sensitivity. Follow-up blood culture and sensitivity.    Coronary artery disease On Xarelto, statin and metoprolol.    Benign essential HTN Continue metoprolol 25 mg p.o. daily.    Dyslipidemia Continue atorvastatin 40 mg p.o. daily.    GERD (gastroesophageal reflux disease) Pantoprazole 40 mg p.o. daily.    OSA on CPAP Continue CPAP.    Hyponatremia Check urine sodium.  Check urine osmolality. Continue LR infusion. Follow sodium level.    Non-small cell carcinoma of left lung, stage 4 (HCC) Follow-up with Dr. [name] as scheduled.    Leukopenia In the setting of chemotherapy. Monitor WBC.    Macrocytic anemia Monitor H&H. Follow-up  transfuse if needed.    DVT prophylaxis: On Xarelto. Code Status:   Full code. Family Communication:   Disposition Plan:   Patient is from:  Home.  Anticipated DC to:  Home.  Anticipated DC date:  11/06/21.  Anticipated DC barriers: Clinical condition.  Consults called:   Admission status:  Stepdown/inpatient.  Severity of Illness: High severity in the setting of sepsis due to community-acquired pneumonia, atrial fibrillation with RVR on medication with on active chemotherapy for stage IV lung cancer.  Reubin Milan MD [facility]  How to contact the [facility] Attending or Consulting provider [place] or covering provider during after hours [place], for this patient?   Check the care team in [facility] and look for a) attending/consulting [facility] provider listed and b) the [facility] team listed Log into [email or web address] and use [place]'s universal password to access. If you do not have the password, please contact the hospital operator.  Locate the [facility] provider you are looking for under [facility] and page to a number that you can be directly reached. If you still have difficulty reaching the provider, please page the [facility] (Director on Call) for the Hospitalists listed on amion for assistance.  11/03/2021, 4:03 PM   This document was prepared using Dragon voice recognition software and may contain some unintended transcription errors.

## 2021-11-03 NOTE — Progress Notes (Signed)
Left lower extremity venous duplex has been completed. Preliminary results can be found in CV Proc through chart review.  Results were given to Dr. Kathrynn Humble.   11/03/21 1:48 PM Corey [name] RVT

## 2021-11-03 NOTE — Progress Notes (Signed)
Pharmacy Antibiotic Note  Corey [name] is a 76 y.o. male admitted on 11/03/2021 with pneumonia.  Pharmacy has been consulted for cefepime and vancomycin dosing.  IV antibiotics administered in ED: 1/31 1241 Cefepime 2 g 1/31 1310 Vancomycin 2000 mg  Plan: Continue Cefepime 2 g IV every 8 hours Continue Vancomycin 1750 mg IV every 24 hours (Goal AUC 400-550, Expected AUC: 474.5, SCr used: 0.91) Monitor clinical progress, renal function, and vancomycin levels if indicated F/U C&S, abx deescalation / LOT      Temp (24hrs), Avg:98.8 F (37.1 C), Min:98.8 F (37.1 C), Max:98.8 F (37.1 C)  Recent Labs  Lab 10/28/21 0845 11/03/21 1208 11/03/21 1229  WBC 9.3 2.8*  --   CREATININE 0.88 0.91  --   LATICACIDVEN  --   --  1.1    Estimated Creatinine Clearance: 90.1 mL/min (by C-G formula based on SCr of 0.91 mg/dL).    Allergies  Allergen Reactions   Amoxicillin Anaphylaxis and Swelling    Tolerates Rocephin   Fosinopril Other (See Comments) and Cough    Muscle aches   Hydromorphone Nausea And Vomiting   Ampicillin Rash   Codeine Nausea Only and Other (See Comments)    Heavy amounts cause nausea and stomach upset   Monopril [Fosinopril Sodium] Other (See Comments)    Muscle aches & pain   Rosuvastatin Other (See Comments)    Joint pain    Antimicrobials this admission: 1/31 cefepime >>  1/31 vancomycin >>   Dose adjustments this admission:   Microbiology results: 1/31 BCx: sent 1/31 MRSA PCR: ordered  Thank you for allowing pharmacy to be a part of this patients care.  Royetta Asal, PharmD, BCPS Clinical Pharmacist [place] Please utilize [employer] for appropriate phone number to reach the unit pharmacist ([place]) 11/03/2021 3:37 PM

## 2021-11-03 NOTE — ED Triage Notes (Signed)
Per EMS, states coming form home-coughing and weakness for 4 days-history of lung cancer-CBG 110

## 2021-11-03 NOTE — ED Notes (Signed)
Pt NAD in bed on Angie 2LPM, a/ox4. Pt states he developed a cough in June and it has become progressively worse and in the last 6 days has become SOB, weakness, CP with cough. Pt denies current SOB or CP. LS expiratory wheeze to right lobe, others clear

## 2021-11-03 NOTE — ED Notes (Signed)
Condom cath placed on pt 

## 2021-11-04 ENCOUNTER — Encounter (HOSPITAL_COMMUNITY): Payer: Self-pay | Admitting: Internal Medicine

## 2021-11-04 DIAGNOSIS — R9431 Abnormal electrocardiogram [ECG] [EKG]: Secondary | ICD-10-CM | POA: Diagnosis not present

## 2021-11-04 DIAGNOSIS — J189 Pneumonia, unspecified organism: Secondary | ICD-10-CM | POA: Diagnosis not present

## 2021-11-04 DIAGNOSIS — C3492 Malignant neoplasm of unspecified part of left bronchus or lung: Secondary | ICD-10-CM | POA: Diagnosis not present

## 2021-11-04 DIAGNOSIS — I4891 Unspecified atrial fibrillation: Secondary | ICD-10-CM | POA: Diagnosis not present

## 2021-11-04 LAB — CBC WITH DIFFERENTIAL/PLATELET
Abs Immature Granulocytes: 0.22 10*3/uL — ABNORMAL HIGH (ref 0.00–0.07)
Basophils Absolute: 0 10*3/uL (ref 0.0–0.1)
Basophils Relative: 1 %
Eosinophils Absolute: 0.1 10*3/uL (ref 0.0–0.5)
Eosinophils Relative: 3 %
HCT: 25.7 % — ABNORMAL LOW (ref 39.0–52.0)
Hemoglobin: 8.4 g/dL — ABNORMAL LOW (ref 13.0–17.0)
Immature Granulocytes: 14 %
Lymphocytes Relative: 12 %
Lymphs Abs: 0.2 10*3/uL — ABNORMAL LOW (ref 0.7–4.0)
MCH: 33.6 pg (ref 26.0–34.0)
MCHC: 32.7 g/dL (ref 30.0–36.0)
MCV: 102.8 fL — ABNORMAL HIGH (ref 80.0–100.0)
Monocytes Absolute: 0.2 10*3/uL (ref 0.1–1.0)
Monocytes Relative: 12 %
Neutro Abs: 0.9 10*3/uL — ABNORMAL LOW (ref 1.7–7.7)
Neutrophils Relative %: 58 %
Platelets: 141 10*3/uL — ABNORMAL LOW (ref 150–400)
RBC: 2.5 MIL/uL — ABNORMAL LOW (ref 4.22–5.81)
RDW: 14.6 % (ref 11.5–15.5)
WBC: 1.5 10*3/uL — ABNORMAL LOW (ref 4.0–10.5)
nRBC: 0 % (ref 0.0–0.2)

## 2021-11-04 LAB — COMPREHENSIVE METABOLIC PANEL
ALT: 7 U/L (ref 0–44)
AST: 21 U/L (ref 15–41)
Albumin: 2.3 g/dL — ABNORMAL LOW (ref 3.5–5.0)
Alkaline Phosphatase: 40 U/L (ref 38–126)
Anion gap: 7 (ref 5–15)
BUN: 12 mg/dL (ref 8–23)
CO2: 23 mmol/L (ref 22–32)
Calcium: 8.4 mg/dL — ABNORMAL LOW (ref 8.9–10.3)
Chloride: 98 mmol/L (ref 98–111)
Creatinine, Ser: 0.68 mg/dL (ref 0.61–1.24)
GFR, Estimated: >60 mL/min (ref >60)
Glucose, Bld: 86 mg/dL (ref 70–99)
Potassium: 3.6 mmol/L (ref 3.5–5.1)
Sodium: 128 mmol/L — ABNORMAL LOW (ref 135–145)
Total Bilirubin: 1.2 mg/dL (ref 0.3–1.2)
Total Protein: 5.8 g/dL — ABNORMAL LOW (ref 6.5–8.1)

## 2021-11-04 LAB — MAGNESIUM: Magnesium: 1.9 mg/dL (ref 1.7–2.4)

## 2021-11-04 LAB — STREP PNEUMONIAE URINARY ANTIGEN: Strep Pneumo Urinary Antigen: NEGATIVE

## 2021-11-04 MED ORDER — LEVALBUTEROL HCL 0.63 MG/3ML IN NEBU
0.6300 mg | INHALATION_SOLUTION | Freq: Two times a day (BID) | RESPIRATORY_TRACT | Status: DC
  Administered 2021-11-04 – 2021-11-20 (×34): 0.63 mg via RESPIRATORY_TRACT
  Filled 2021-11-04 (×34): qty 3

## 2021-11-04 MED ORDER — MAGNESIUM OXIDE -MG SUPPLEMENT 400 (240 MG) MG PO TABS
400.0000 mg | ORAL_TABLET | Freq: Two times a day (BID) | ORAL | Status: DC
  Administered 2021-11-04 – 2021-11-09 (×11): 400 mg via ORAL
  Filled 2021-11-04 (×11): qty 1

## 2021-11-04 MED ORDER — SENNOSIDES-DOCUSATE SODIUM 8.6-50 MG PO TABS
1.0000 | ORAL_TABLET | Freq: Two times a day (BID) | ORAL | Status: DC
  Administered 2021-11-04 – 2021-11-20 (×21): 1 via ORAL
  Filled 2021-11-04 (×25): qty 1

## 2021-11-04 MED ORDER — MAGNESIUM SULFATE IN D5W 1-5 GM/100ML-% IV SOLN
1.0000 g | Freq: Once | INTRAVENOUS | Status: AC
Start: 2021-11-04 — End: 2021-11-04
  Administered 2021-11-04: 1 g via INTRAVENOUS
  Filled 2021-11-04: qty 100

## 2021-11-04 MED ORDER — METOCLOPRAMIDE HCL 5 MG/ML IJ SOLN
10.0000 mg | Freq: Four times a day (QID) | INTRAMUSCULAR | Status: DC | PRN
  Administered 2021-11-04 – 2021-11-09 (×4): 10 mg via INTRAVENOUS
  Filled 2021-11-04 (×4): qty 2

## 2021-11-04 MED ORDER — SODIUM CHLORIDE 0.9 % IV SOLN: INTRAVENOUS | Status: AC

## 2021-11-04 MED ORDER — ARFORMOTEROL TARTRATE 15 MCG/2ML IN NEBU
15.0000 ug | INHALATION_SOLUTION | Freq: Two times a day (BID) | RESPIRATORY_TRACT | Status: DC
  Administered 2021-11-04 – 2021-11-20 (×34): 15 ug via RESPIRATORY_TRACT
  Filled 2021-11-04 (×34): qty 2

## 2021-11-04 MED ORDER — POTASSIUM CHLORIDE CRYS ER 20 MEQ PO TBCR
40.0000 meq | EXTENDED_RELEASE_TABLET | Freq: Once | ORAL | Status: AC
  Administered 2021-11-04: 40 meq via ORAL
  Filled 2021-11-04: qty 2

## 2021-11-04 MED ORDER — POTASSIUM CHLORIDE ER 10 MEQ PO TBCR
20.0000 meq | EXTENDED_RELEASE_TABLET | Freq: Every day | ORAL | Status: DC
  Administered 2021-11-05 – 2021-11-07 (×3): 20 meq via ORAL
  Filled 2021-11-04 (×5): qty 2

## 2021-11-04 MED ORDER — UMECLIDINIUM BROMIDE 62.5 MCG/ACT IN AEPB
1.0000 | INHALATION_SPRAY | Freq: Every day | RESPIRATORY_TRACT | Status: DC
  Administered 2021-11-04 – 2021-11-10 (×7): 1 via RESPIRATORY_TRACT
  Filled 2021-11-04: qty 7

## 2021-11-04 MED ORDER — METOPROLOL TARTRATE 5 MG/5ML IV SOLN
INTRAVENOUS | Status: AC
  Filled 2021-11-04: qty 5

## 2021-11-04 MED ORDER — METOPROLOL TARTRATE 5 MG/5ML IV SOLN
5.0000 mg | Freq: Once | INTRAVENOUS | Status: AC
  Administered 2021-11-04: 5 mg via INTRAVENOUS

## 2021-11-04 MED ORDER — POLYETHYLENE GLYCOL 3350 17 G PO PACK
17.0000 g | PACK | Freq: Two times a day (BID) | ORAL | Status: DC
  Administered 2021-11-04 – 2021-11-20 (×10): 17 g via ORAL
  Filled 2021-11-04 (×19): qty 1

## 2021-11-04 NOTE — Assessment & Plan Note (Addendum)
-   ongoing LLL consolidation which may be acutely worse on admission; symptomatically he felt worse over the past ~1 week prior to admission - Completed abx course - cultures had remained neg -On baseline oxygen requirement

## 2021-11-04 NOTE — Assessment & Plan Note (Addendum)
-   tikosyn now officially discontinued per cardiology - continue monitoring electrolytes; goal K>4, Mg>2

## 2021-11-04 NOTE — Assessment & Plan Note (Addendum)
-   appeared clinically volume depleted on admission - unfortunately edema was a big issue in LE; given hypoalbuminemia he is likely 3rd spacing. FeNa calculated <<1% on 11/06/21 consistent with intravascular volume depletion and worsening hyponatremia at that time - pt is s/p albumin - Na had improved with albumin and lasix -Sodium level is stable now

## 2021-11-04 NOTE — Progress Notes (Signed)
Chaplain engaged in an initial visit with Corey [name].  Chaplain offered listening as they shared about Corey [name]'s healthcare journey.  Chaplain spent some time with them talking about their needs and expectations in customer service, what they have learned throughout Corey [name] treatments, their former jobs/positions, and family.  Chaplain offered listening and support throughout this time.   Corey [name] expressed Corey number of changes and transitions they have endured.  Chaplain affirmed those transitions and Corey ways they have pivoted with those changes.    Chaplain will continue to follow-up.     11/04/21 1300  Clinical Encounter Type  Visited With Patient and family together  Visit Type Initial

## 2021-11-04 NOTE — Hospital Course (Addendum)
Corey [name] is a 76 y.o. male with PMH stage IV NSCLC with liver mets, PAF, CAD, chronic low back p ain, anemia, anxiety, osteoarthritis, HLD, HTN, GERD, OSA on CPAP, COPD on 4 L of oxygen per minute at home prostate cancer who presented with approximately 4 days of progressively worsening weakness and ongoing productive cough of yellow sputum with associated pleuritic chest pain, malaise, dyspnea.  He recently deceived chemotherapy within the past week as well.  Due to continued symptoms of feeling poorly, he presented for further evaluation. On evaluation in the ER, he underwent CXR which showed ongoing extensive irregular opacities involving the left lung not significantly changed from prior imaging.  Prior CT chest on 10/15/2021 showed a stable dense consolidation in the lingula and left lower lobe. Notable labs included WBC 2.8, platelet count 158.  Sodium 128.  BNP 187. There was concern for possible pneumonia and he was started on vancomycin and cefepime.currently respiratory status is stable.  He also developed A. fib with RVR and was started on a Cardizem drip.  Currently his rate is well controlled on oral medicines.  Cardiology following, signed off with plan for outpatient DCCV if patient remains in A-fib.  PT has recommended inpatient rehab on discharge.  Hemodynamically stable for discharge whenever possible.

## 2021-11-04 NOTE — Assessment & Plan Note (Signed)
-   no overt bleeding - downtrend suspected in setting of chemo recently

## 2021-11-04 NOTE — Progress Notes (Signed)
Progress Note    SHER HELLINGER   XBM:[id number]  DOB: 1945-10-31  DOA: 11/03/2021     1 PCP: [name], MD  Initial CC: cough  Hospital Course: DAYMEON FISCHMAN is a 76 y.o. male with PMH stage IV NSCLC with liver mets, PAF, CAD, chronic low back p ain, anemia, anxiety, osteoarthritis, HLD, HTN, GERD, OSA on CPAP, prostate cancer who presented with approximately 4 days of progressively worsening weakness and ongoing productive cough of yellow sputum with associated pleuritic chest pain, malaise, dyspnea.  He recently deceived chemotherapy within the past week as well.  Due to continued symptoms of feeling poorly, he presented for further evaluation. On evaluation in the ER, he underwent CXR which showed ongoing extensive irregular opacities involving the left lung not significantly changed from prior imaging.  Prior CT chest on 10/15/2021 showed a stable dense consolidation in the lingula and left lower lobe. Notable labs included WBC 2.8, platelet count 158.  Sodium 128.  BNP 187. There was concern for possible pneumonia and he was started on vancomycin and cefepime. He also developed A. fib with RVR and was started on a Cardizem drip.  Interval History:  This morning he was in RVR with slightly low BP. Dose of IV lopressor given with some improvement in HR but BP did downtrend some; cardizem drip still running.  He denied CP but did endorse feeling his rapid rate.   Assessment & Plan: Assessment and Plan: * Atrial fibrillation with RVR ([place])- (present on admission) - on Tikosyn, Toprol, and Xarelto at home - Patient noted to be in A. fib with RVR on admission -Started on Cardizem drip on admission as well however blood pressure soft this morning and EKG showing prolonged QTC - s/p PRN IV lopressor with fair response but not sustained - cardiology consulted for further assistance given complexity of med regimen and prolonged QTc - continue cardizem drip for now; management deferred to  cardiology   CAP (community acquired pneumonia)- (present on admission) - ongoing LLL consolidation which may be acutely worse; symptomatically he feels worse over the past ~1 week - s/p vanc and cefepime on admission; MRSA screen negative, okay to d/c vanc and continue monotherapy cefepime at this time - trend PCT - follow up cultures  Non-small cell carcinoma of left lung, stage 4 ([place])- (present on admission) - follows outpatient with Dr. [name]  Prolonged Q-T interval on ECG - tikosyn held this am until seen by cardiology - continue monitoring electrolytes; goal K>4, Mg>2  Leukopenia- (present on admission) - likely from underlying possible infection vs recent chemo use   Hyponatremia- (present on admission) - appeared clinically volume depleted - continue IVF - BMP in am   Macrocytic anemia- (present on admission) - no overt bleeding - downtrend suspected in setting of chemo recently   OSA on CPAP - continue nightly CPAP  GERD (gastroesophageal reflux disease)- (present on admission) - continue PPI  Dyslipidemia- (present on admission) - Continue Lipitor  Benign essential HTN- (present on admission) - BP soft after cardizem drip started - continue monitoring   Coronary artery disease- (present on admission) - Continue Lipitor, Toprol -On Xarelto for underlying A. fib     Old records reviewed in assessment of this patient  Antimicrobials: Vancomycin 11/03/2021 >> 11/04/2021 Cefepime 11/03/2021 >> current  DVT prophylaxis: Xarelto  Code Status:   Code Status: Full Code  Disposition Plan:  Home in 2-3 days Status is: Inpt  Objective: Blood pressure 100/61, pulse 95, temperature  98.9 F (37.2 C), temperature source Oral, resp. rate 19, SpO2 97 %.  Examination:  Physical Exam Constitutional:      General: He is not in acute distress.    Appearance: Normal appearance.     Comments: Lethargic appearing  HENT:     Head: Normocephalic and atraumatic.      Mouth/Throat:     Mouth: Mucous membranes are moist.  Eyes:     Extraocular Movements: Extraocular movements intact.  Cardiovascular:     Rate and Rhythm: Tachycardia present. Rhythm irregular.     Heart sounds: Normal heart sounds.  Pulmonary:     Effort: Pulmonary effort is normal.     Breath sounds: Rhonchi present. No wheezing.  Abdominal:     General: Bowel sounds are normal. There is no distension.     Palpations: Abdomen is soft.     Tenderness: There is no abdominal tenderness.  Musculoskeletal:        General: Normal range of motion.     Cervical back: Normal range of motion and neck supple.     Comments: 1+ B/L LE edema  Skin:    General: Skin is warm and dry.  Neurological:     General: No focal deficit present.     Mental Status: He is alert.  Psychiatric:        Mood and Affect: Mood normal.        Behavior: Behavior normal.     Consultants:  Cardiology  Procedures:    Data Reviewed: I have Reviewed nursing notes, Vitals, and Lab results since pt's last encounter. Pertinent lab results prolonged QTC, WBC 1.5, Hgb 8.4 g/dL, Na 128    LOS: 1 day   Dwyane Dee, MD [facility] 11/04/2021, 2:20 PM

## 2021-11-04 NOTE — Assessment & Plan Note (Addendum)
-   on Tikosyn, Toprol, and Xarelto at home - Patient noted to be in A. fib with RVR on admission - cardiology consulted for further assistance given complexity of med regimen and prolonged QTc - Given digoxin load on 2/7.  - Weaned of cardizem gtt, up to 240mg  daily -Now on PO amiodarone per Cardiology, would not continue long-term. Currently on 400mg  bid x 7 days total, then 200mg  daily x 7 more days -Per Cardiology, plan for DCCV if patient remains in afib as outpt after 3 weeks of uninterrupted AC -metoprolol cont at 100mg  bid per Cardiology -Xarelto continued -Cardiology has since signed off

## 2021-11-04 NOTE — Progress Notes (Signed)
Patient had a short run of SVT, Cardizem restarted per protocol. Patient denies any dizziness, diaphoresis, or chest pain.   VSS, will continue to monitor.

## 2021-11-04 NOTE — Assessment & Plan Note (Signed)
-  Continue Lipitor °

## 2021-11-04 NOTE — Consult Note (Addendum)
Cardiology Consultation:   Patient ID: Corey [name] MRN: [id number]; DOB: [birth year]  Admit date: 11/03/2021 Date of Consult: 11/04/2021  PCP:  [name], MD   [facility] Cardiologist:  Mertie Moores, MD        Patient Profile:   Corey [name] is a 76 y.o. male with a hx of CAD s/p remote PCIs of RCA 2004 and 2007 with otherwise mild nonbstructive CAD, CVA, paroxysmal atrial fibrillation on Tikosyn, ILR in place, stage IV lung CA, HTN, HLD, prostate CA, OSA on CPAP, mild carotid artery disease (6-06% RICA, 30-16% LICA by duplex 10/930), prior anemia requiring transfusions, mild dilation of ascending aorta who is being seen 11/04/2021 for the evaluation of atrial fibrillation at the request of Dr. Sabino Gasser.  History of Present Illness:   Corey [name] is has followed with Dr. Acie Fredrickson and Dr. Rayann Heman with history above. His atrial fib was diagnosed many years ago after his stroke and he was treated with Xarelto. He was ultimately started on Tikosyn. In 02/2020 he had ILR implanted for pre-syncope. At last OV 01/2021 Dr. Acie Fredrickson was concerned about potential interaction between a medicine called Eligard for his prostate CA interacting with Tikosyn. Eligard was to be stopped in 03/2021. Dr. Acie Fredrickson questioned whether atrial fib ablation could be considered in the future. 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 He was treated with palliative radiotherapy and is now being treated with palliative systemic chemo (Alimta, Keytruda). He has had multiple admissions since that diagnosis for various issues including anemia requiring transfusion 05/2021 (felt chemo  induced, FOBT neg) and 08/2021 with cellultis, respiratory failure felt due to aspiration PNA vs drug-induced pneumonitis, electrolyte abnormalities, prolonged QT interval, and another round of anemia requiring transfusion. 2D echo 08/2021 EF 65-70%, mild LVH, mild-mod RV dysfunction, mild RVE, mild dilation of ascending aorta. CT 10/15/21 showed NEW multifocal hepatic metastasis and enlargement of the previous caudate lobe metastasis. Options were discussed with a plan to continue with Alimta chemotherapy and follow progress.  He presented to the hospital this admission with 4 day history of worsening weakness, cough with yellow sputum, left sided pleuritic chest pain only when coughing, SOB, dyspnea, chills but no fever. He had not really been eating or drinking well since Wednesday of last week. His wife has noticed his LLE was somewhat more red in the shin than usual. LLE venous duplex negative. Labs show hyponatremia of 128, pancytopenia, (WBC 1.5, Hgb 8.4, plt 141), procalcitonin 0.33, BNP 187, albumin 2.3. QTC is again prolonged so Tikosyn was held starting this morning. CXR with no significant change since previous study including extensive irregular opacities in the left lung. While admitted, was noted to go in and out of atrial fib with RVR. When out of rhythm he feels nauseated and can feel it in his throat. Otherwise no acute CP/dyspnea, dizziness or syncope with this. BPs have been variable, ranging soft to hypertensive. He was placed on diltiazem drip last night and has required intermittent dosing of IV metoprolol. He converted to NSR again this AM around 10am. Last HR 92, BP 100/64.    Past Medical History:  Diagnosis Date   Anemia    Anxiety    Arthritis    "  back, right knee, hands, ankles, neck" (05/31/2016)   Carotid artery disease (HCC)    Chronic lower back pain    Coronary atherosclerosis of native coronary artery    a. BMS to [facility] 2004 and 2007, otherwise mild nonobstructive  disease. EF normal.   Diverticulitis    Dyslipidemia    Essential hypertension, benign    Family history of breast cancer    Family history of lung cancer    Family history of prostate cancer    GERD (gastroesophageal reflux disease)    Lumbar radiculopathy, chronic 02/04/2015   Right L5   Lung cancer ([place])    Mild dilation of ascending aorta ([place]) 08/2021   Obesity    OSA on CPAP    uses cpap   Parkinson's disease ([place])    Paroxysmal atrial fibrillation ([place])    a. Discovered after stroke.   Pneumonia 01/1996   PONV (postoperative nausea and vomiting)    Prostate CA (HCC)    Recurrent upper respiratory infection (URI)    Stroke ([place]) 07/2012   no deficits   Symptomatic anemia    Visit for monitoring Tikosyn therapy 09/18/2019    Past Surgical History:  Procedure Laterality Date   ADENOIDECTOMY     ANTERIOR CERVICAL DECOMP/DISCECTOMY FUSION  07/2001; 10/2002   "C5-6; C6-7; redo"   BACK SURGERY     CARPAL TUNNEL RELEASE Left 10/2015   CHEST TUBE INSERTION Left 03/17/2021   Procedure: INSERTION PLEURAL DRAINAGE CATHETER;  Surgeon: Garner Nash, DO;  Location: [facility];  Service: Pulmonary;  Laterality: Left;  Indwelling Tunneled Pleural catheter (PLEUREX)    COLONOSCOPY W/ POLYPECTOMY  02/2014   CORONARY ANGIOPLASTY WITH STENT PLACEMENT  05/2003; 12/2005   "mid RCA; mid RCA"   FINE NEEDLE ASPIRATION  02/26/2021   Procedure: FINE NEEDLE ASPIRATION (FNA) LINEAR;  Surgeon: Candee Furbish, MD;  Location: [facility] ENDOSCOPY;  Service: Pulmonary;;   HAND SURGERY  02/2019   LEFT HAND   implantable loop recorder placement  02/27/2020   Medtronic Reveal [place] model [id number]  implantable loop recorder implanted by Dr Rayann Heman for afib management and evaluation of presyncope   IR IMAGING GUIDED PORT INSERTION  04/16/2021   JOINT REPLACEMENT     KNEE ARTHROSCOPY Left 10/2005   KNEE ARTHROSCOPY W/ PARTIAL MEDIAL MENISCECTOMY Left 09/2005   LUMBAR LAMINECTOMY/DECOMPRESSION  MICRODISCECTOMY  03/2005   "L4-5"   POSTERIOR LUMBAR FUSION  10/2003   L5-S1; "plates, screws"   SHOULDER ARTHROSCOPY Right 08/2011   Debridement of labrum, arthroscopic distal clavicle excision   SHOULDER OPEN ROTATOR CUFF REPAIR Left 07/2014   TEE WITHOUT CARDIOVERSION  07/07/2012   Procedure: TRANSESOPHAGEAL ECHOCARDIOGRAM (TEE);  Surgeon: Fay Records, MD;  Location: [facility] ENDOSCOPY;  Service: Cardiovascular;  Laterality: N/A;   THORACENTESIS N/A 02/25/2021   Procedure: Mathews Robinsons;  Surgeon: Juanito Doom, MD;  Location: [facility];  Service: Cardiopulmonary;  Laterality: N/A;   TONSILLECTOMY AND ADENOIDECTOMY  ~ 1956   TOTAL KNEE ARTHROPLASTY Left 10/2006   TRIGGER FINGER RELEASE Left 10/2015   VIDEO BRONCHOSCOPY WITH ENDOBRONCHIAL ULTRASOUND Left 02/26/2021   Procedure: VIDEO BRONCHOSCOPY WITH ENDOBRONCHIAL ULTRASOUND;  Surgeon: Candee Furbish, MD;  Location[facility] ENDOSCOPY;  Service: Pulmonary;  Laterality: Left;  cryoprobe too thanks!     Home Medications:  Prior to Admission medications   Medication Sig Start Date End Date Taking? Authorizing Provider  acetaminophen (TYLENOL) 325 MG tablet Take 2 tablets (650 mg total) by mouth every 6 (six) hours  as needed for mild pain (or Fever >/= 101). 08/25/21  Yes Angiulli, Lavon Paganini, PA-C  albuterol (PROVENTIL) (2.5 MG/3ML) 0.083% nebulizer solution Inhale 3 mLs into the lungs every 4 (four) hours as needed for wheezing or shortness of breath. Patient taking differently: Inhale 3 mLs into the lungs See admin instructions. Nebulize 2.5 mg & inhale into the lungs 3-4 times a day and may also use an additional 2.5 mg up to two times a day as needed for shortness of breath 08/28/21  Yes Angiulli, Lavon Paganini, PA-C  ALPRAZolam Duanne Moron) 0.5 MG tablet Take 1 tablet (0.5 mg total) by mouth at bedtime as needed for anxiety. Patient taking differently: Take 0.5 mg by mouth at bedtime as needed for anxiety or sleep. 08/25/21  Yes Angiulli, Lavon Paganini, PA-C   atorvastatin (LIPITOR) 40 MG tablet Take 1 tablet (40 mg total) by mouth daily. Patient taking differently: Take 40 mg by mouth at bedtime. 08/25/21  Yes Angiulli, Lavon Paganini, PA-C  benzonatate (TESSALON) 200 MG capsule Take 1 capsule (200 mg total) by mouth 3 (three) times daily. Patient taking differently: Take 200 mg by mouth in the morning, at noon, and at bedtime. 10/07/21  Yes Heilingoetter, Cassandra L, PA-C  budesonide (PULMICORT) 0.5 MG/2ML nebulizer solution Take 2 mLs (0.5 mg total) by nebulization 2 (two) times daily. Patient taking differently: Take 0.5 mg by nebulization in the morning and at bedtime. 09/25/21  Yes Icard, Bradley L, DO  carbidopa-levodopa (SINEMET IR) 25-100 MG tablet Take 1 tablet by mouth 3 (three) times daily. Patient taking differently: Take 1 tablet by mouth See admin instructions. Take 1 tablet by mouth at 8 AM, 12 PM, 4 PM, and bedtime 07/09/21  Yes Tat, Eustace Quail, DO  chlorpheniramine-HYDROcodone (TUSSIONEX) 10-8 MG/5ML SUER Take 5 mLs by mouth every 12 (twelve) hours as needed for cough. Patient taking differently: Take 5 mLs by mouth at bedtime. 08/25/21  Yes Angiulli, Lavon Paganini, PA-C  dofetilide (TIKOSYN) 500 MCG capsule Take 1 capsule (500 mcg total) by mouth 2 (two) times daily. Please keep upcoming appointment in February for future refills. Thank you Patient taking differently: Take 500 mcg by mouth 2 (two) times daily. 08/25/21  Yes Angiulli, Lavon Paganini, PA-C  fenofibrate 160 MG tablet TAKE 1 TABLET DAILY. PLEASE KEEP UPCOMING APPT IN MARCH WITH DR. Acie Fredrickson BEFORE ANYMORE REFILLS. Patient taking differently: Take 160 mg by mouth See admin instructions. Take 160 mg by mouth at 2 PM daily 08/25/21  Yes Angiulli, Lavon Paganini, PA-C  fluticasone (FLONASE) 50 MCG/ACT nasal spray Place 2 sprays into both nostrils daily. 08/25/21  Yes Angiulli, Lavon Paganini, PA-C  folic acid (FOLVITE) 1 MG tablet Take 1 tablet (1 mg total) by mouth daily. 08/25/21  Yes Angiulli, Lavon Paganini, PA-C   guaiFENesin (MUCINEX) 600 MG 12 hr tablet Take 2 tablets (1,200 mg total) by mouth 2 (two) times daily. 08/12/21  Yes Kathie Dike, MD  ipratropium (ATROVENT) 0.03 % nasal spray Place 1-2 sprays into both nostrils every 12 (twelve) hours. For drainage Patient taking differently: Place 2 sprays into both nostrils in the morning. 08/28/21  Yes Angiulli, Lavon Paganini, PA-C  levalbuterol (XOPENEX) 0.63 MG/3ML nebulizer solution Take 3 mLs (0.63 mg total) by nebulization 2 (two) times daily. 09/25/21  Yes Icard, Octavio Graves, DO  loratadine (CLARITIN) 10 MG tablet Take 1 tablet (10 mg total) by mouth daily as needed for allergies (nasal drainage). Patient taking differently: Take 10 mg by mouth daily. 08/25/21  Yes Lauraine Rinne  J, PA-C  magnesium oxide (MAG-OX) 400 (240 Mg) MG tablet Take 1 tablet (400 mg total) by mouth daily. 08/25/21  Yes Angiulli, Lavon Paganini, PA-C  metoprolol succinate (TOPROL-XL) 25 MG 24 hr tablet Take 1 tablet (25 mg total) by mouth daily. 08/25/21  Yes Angiulli, Lavon Paganini, PA-C  Multiple Vitamins-Minerals (MULTIVITAMINS THER. W/MINERALS) TABS Take 1 tablet by mouth daily with breakfast.   Yes [provider]  nitroGLYCERIN (NITROSTAT) 0.4 MG SL tablet Place 1 tablet (0.4 mg total) under the tongue every 5 (five) minutes as needed for chest pain. 08/25/21  Yes Angiulli, Lavon Paganini, PA-C  omeprazole (PRILOSEC) 20 MG capsule Take 1 capsule (20 mg total) by mouth daily. Patient taking differently: Take 20 mg by mouth daily before breakfast. 08/25/21  Yes Angiulli, Lavon Paganini, PA-C  ondansetron (ZOFRAN) 8 MG tablet Take 8 mg by mouth every 8 (eight) hours as needed for nausea or vomiting.   Yes [provider]  polyethylene glycol (MIRALAX / GLYCOLAX) 17 g packet Take 17 g by mouth daily. Patient taking differently: Take 17 g by mouth daily as needed for mild constipation (MIX AS DIRECTED AND DRINK). 08/25/21  Yes Angiulli, Lavon Paganini, PA-C  potassium chloride (KLOR-CON) 10  MEQ tablet TAKE 1 TABLET BY MOUTH EVERY DAY Patient taking differently: Take 10 mEq by mouth See admin instructions. Take 10 mEq by mouth at 2 PM daily 07/16/21  Yes Nahser, Wonda Cheng, MD  rivaroxaban (XARELTO) 20 MG TABS tablet Take 1 tablet (20 mg total) by mouth daily. Patient taking differently: Take 20 mg by mouth every evening. 08/25/21  Yes Angiulli, Daniel J, PA-C  RYVENT 6 MG TABS TAKE 1 TABLET BY MOUTH 2 (TWO) TIMES DAILY AS NEEDED (DRAINAGE). Patient taking differently: Take 6 mg by mouth in the morning and at bedtime. 10/29/21  Yes Garnet Sierras, DO  Saccharomyces boulardii (PROBIOTIC) 250 MG CAPS Take 250 mg by mouth daily.   Yes [provider]  tamsulosin (FLOMAX) 0.4 MG CAPS capsule Take 1 capsule (0.4 mg total) by mouth daily after supper. 08/25/21  Yes Angiulli, Lavon Paganini, PA-C  Tiotropium Bromide-Olodaterol (STIOLTO RESPIMAT) 2.5-2.5 MCG/ACT AERS Inhale 2 puffs into the lungs daily. 09/25/21  Yes Icard, Bradley L, DO  topiramate (TOPAMAX) 50 MG tablet Take 2 tablets (100 mg total) by mouth 2 (two) times daily. 08/25/21  Yes Angiulli, Lavon Paganini, PA-C  traMADol (ULTRAM) 50 MG tablet Take 1 tablet (50 mg total) by mouth every 6 (six) hours as needed for moderate pain. 08/25/21  Yes Angiulli, Lavon Paganini, PA-C  Vitamin D, Ergocalciferol, (DRISDOL) 1.25 MG (50000 UNIT) CAPS capsule Take 1 capsule (50,000 Units total) by mouth every 7 (seven) days. Monday Patient taking differently: Take 50,000 Units by mouth every Monday. 10/05/21  Yes Brunetta Genera, MD  ipratropium (ATROVENT) 0.02 % nebulizer solution Take 2.5 mLs (0.5 mg total) by nebulization 2 (two) times daily. Patient not taking: Reported on 11/03/2021 08/28/21   Angiulli, Lavon Paganini, PA-C  traZODone (DESYREL) 50 MG tablet Take 1 tablet (50 mg total) by mouth at bedtime. Patient not taking: Reported on 11/03/2021 08/25/21   Cathlyn Parsons, PA-C    Inpatient Medications: Scheduled Meds:  acidophilus  1 capsule Oral Daily    arformoterol  15 mcg Nebulization BID   And   umeclidinium bromide  1 puff Inhalation Daily   atorvastatin  40 mg Oral QHS   budesonide  0.5 mg Nebulization BID   carbidopa-levodopa  1 tablet Oral  4 times per day   Carbinoxamine Maleate  6 mg Oral BID   Chlorhexidine Gluconate Cloth  6 each Topical Daily   fenofibrate  160 mg Oral Q1400   fluticasone  2 spray Each Nare Daily   folic acid  1 mg Oral Daily   guaiFENesin  1,200 mg Oral BID   ipratropium  2 spray Each Nare q AM   levalbuterol  0.63 mg Nebulization BID   magnesium oxide  400 mg Oral BID   metoprolol succinate  25 mg Oral Daily   pantoprazole  40 mg Oral Daily   potassium chloride  10 mEq Oral Q1400   rivaroxaban  20 mg Oral QPM   sodium chloride flush  3 mL Intravenous Q12H   tamsulosin  0.4 mg Oral QPC supper   topiramate  100 mg Oral BID   Continuous Infusions:  sodium chloride     ceFEPime (MAXIPIME) IV Stopped (11/04/21 0527)   diltiazem (CARDIZEM) infusion 5 mg/hr (11/04/21 0645)   magnesium sulfate bolus IVPB     vancomycin     PRN Meds: sodium chloride, acetaminophen **OR** acetaminophen, albuterol, ALPRAZolam, benzonatate, chlorpheniramine-HYDROcodone, lip balm, nitroGLYCERIN, polyethylene glycol, sodium chloride flush  Allergies:    Allergies  Allergen Reactions   Amoxicillin Anaphylaxis and Swelling    Tolerates Rocephin   Fosinopril Other (See Comments) and Cough    Muscle aches   Hydromorphone Nausea And Vomiting   Ampicillin Rash   Codeine Nausea Only and Other (See Comments)    Heavy amounts cause nausea and stomach upset   Monopril [Fosinopril Sodium] Other (See Comments)    Muscle aches & pain   Rosuvastatin Other (See Comments)    Joint pain    Social History:   Social History   Socioeconomic History   Marital status: Married    Spouse name: Renee   Number of children: 3   Years of education: 14   Highest education level: Not on file  Occupational History   Occupation:  Retired    Fish farm manager: DISABLED     Comment: Works at [employer] part time  Tobacco Use   Smoking status: Former    Packs/day: 1.00    Years: 34.00    Pack years: 34.00    Types: Cigarettes    Quit date: 05/05/2003    Years since quitting: 18.5    Passive exposure: Never   Smokeless tobacco: Never  Vaping Use   Vaping Use: Never used  Substance and Sexual Activity   Alcohol use: No    Alcohol/week: 0.0 standard drinks   Drug use: No   Sexual activity: Not Currently  Other Topics Concern   Not on file  Social History Narrative   Patient lives at home with spouse.   Caffeine Use:    Social Determinants of Radio broadcast assistant Strain: Not on file  Food Insecurity: Not on file  Transportation Needs: Not on file  Physical Activity: Not on file  Stress: Not on file  Social Connections: Not on file  Intimate Partner Violence: Not on file    Family History:    Family History  Problem Relation Age of Onset   Hypertension Mother    Heart disease Father    Heart attack Father    Prostate cancer Brother 15   Parkinson's disease Brother    Lung cancer Paternal Aunt        hx of smoking   Breast cancer Paternal Grandmother  dx 58s, bilateral mastectomies   Heart attack Paternal Grandfather    Blindness Son    Colon cancer Neg Hx    Pancreatic cancer Neg Hx      ROS:  Please see the history of present illness.   All other ROS reviewed and negative.     Physical Exam/Data:   Vitals:   11/04/21 0700 11/04/21 0800 11/04/21 0809 11/04/21 1016  BP:  130/71  100/64  Pulse: (!) 124 (!) 155  92  Resp: (!) 22 (!) 22    Temp:  98.6 F (37 C)    TempSrc:  Axillary    SpO2: 98% 98% 99%     Intake/Output Summary (Last 24 hours) at 11/04/2021 1025 Last data filed at 11/04/2021 1022 Gross per 24 hour  Intake 890.74 ml  Output 2475 ml  Net -1584.26 ml   Last 3 Weights 10/28/2021 10/23/2021 10/20/2021  Weight (lbs) 228 lb 8 oz 229 lb 12.8 oz 229 lb 1.6 oz  Weight  (kg) 103.647 kg 104.237 kg 103.919 kg     There is no height or weight on file to calculate BMI.  General: Well developed, well nourished WM in no acute distress. Head: Normocephalic, atraumatic, sclera non-icteric, no xanthomas, nares are without discharge. Neck: Negative for carotid bruits. JVP not elevated. Lungs: Decreased BS L lower lung without wheeze, rales, or rhonchi. Breathing is unlabored. Heart: RRR S1 S2 without murmurs, rubs, or gallops.  Abdomen: Soft, rounded, non-tender, non-distended with normoactive bowel sounds. No rebound/guarding. Extremities: No clubbing or cyanosis. No edema. Distal pedal pulses are 2+ and equal bilaterally. Mild LLE lower leg erythema. Neuro: Alert and oriented X 3. Moves all extremities spontaneously. Psych:  Responds to questions appropriately with a normal affect.   EKG:  The EKG was personally reviewed and demonstrates:   Multiple tracings reviewed Most recently atrial fib RVR 146bpm, no acute STT changes, QTC 550ms Earlier today NSR 96bpm, occasional PAC, prolonged QTC 51ms no acute STT changes  Telemetry:  Telemetry was personally reviewed and demonstrates:  atrial fib, NSR  Relevant CV Studies: Cath 2007  PROCEDURES PERFORMED:  1.  Left heart catheterization.  2.  Coronary angiography.  3.  Left ventriculogram.  4.  PCI/bare-metal stent implantation mid-right coronary.  5.  Intravascular ultrasound.  6.  Intracoronary thrombectomy.    INDICATIONS:  Corey [name] is a 76 year old man with known ASCVD, and S/P  drug-eluting/Taxus stent implantation August of 2004. He underwent coronary  angiography in March 2006 because of anginal symptoms. The stent was widely  patent. He presented approximately 1645 this afternoon at [facility] Room with chest pain x2-1/2 hours. An acute inferior  myocardial infarction was apparent on ECG. He was brought to catheterization  laboratory at this time to identify the extent of  disease and provide for  further therapeutic options.    PROCEDURE NOTE:  The patient is brought to cardiac catheterization  laboratory in the fasting state. The right groin was prepped and draped in  the usual sterile fashion. Local anesthesia was obtained with infiltration  of 1% lidocaine. A 6-French catheter sheath was inserted percutaneously in  the right femoral artery utilizing an anterior approach over a guiding J-  wire. The patient had originally received 5000 units of heparin  intravenously in the emergency room and the initial ACT was 217 seconds. He  did receive an additional 1000 units of heparin. A 6-French #4 left Judkins  catheter was advanced to the ascending aorta  where the pressure was  recorded. The left coronary os was then engaged and cineangiography  performed in LAO and RAO projections. The #4 left Judkins catheter was  exchanged for #4 right guiding catheter short-tip.  This was advanced to the  ascending aorta and the right coronary os engaged. Cineangiography performed  in LAO and RAO projections. It was noted that the artery was patent with  TIMI grade III flow but there was extensive filling defects throughout the  stented segment in the mid-right coronary. The patient received a double  bolus Integrilin in constant infusion. A 0.014 inches Luge intracoronary  guidewire was passed across the lesion in to the stented segment in the mid-  right coronary without difficulty. Intracoronary thrombectomy was then  performed using the export catheter. Two separate syringes of heme were  obtained during multiple pullback and advance this through the thrombotic  portion of the artery. This maneuver did successfully improve the  angiographic appearance with less evidence of filling defects. A 3.5/24 mm  Scimed Liberte stent was then placed across the midportion of the lesion.  The previously placed stent was a 3.0/32 mm Taxus. The Liberte stent was  deployed at peak  pressure of 16 atmospheres for approximately 20 seconds.  This balloon was deflated and removed and the intravascular ultrasound  catheter Atlantis was advanced distal to the stented segment. A single  mechanical pullback was obtained and the images were analyzed. There  appeared to be good stent apposition, however, there was a decrease in  luminal diameter within the mid-portion of the stented segment. Therefore a  3.75/20 mm Scimed Quantum Maverick intracoronary balloon was placed within  the mid-portion of the stented segment inflated to peak pressure of 20  atmospheres. It was deflated, withdrawn slightly and inflated again to 20  atmospheres. Neither inflation were greater than 30 seconds.    The intravascular ultrasound catheter was return of circulation and a single  mechanical pullback obtained. Cineangiography was repeated in orthogonal  views without the guidewire in place. The guiding catheter was removed. A  pigtail catheter was then advanced to the left ventricle and pressure  recorded. A cine angiogram in the RAO projection obtained with a power  injector, 20 mL were injected at 12 mL per second. Pressure was recorded  during pullback of the catheter across the aortic valve. The pigtail  catheter was then removed. The arterial sheath was sewn into place. The  patient was transported to recovery area in stable condition.    HEMODYNAMIC RESULTS:  Systemic arterial pressure was 114/68 with a mean of  88 mmHg. There was no systolic gradient across the aortic valve. Left  ventricular end-diastolic pressure was 10 mmHg pre ventriculogram.    ANGIOGRAPHY:  The left ventriculogram demonstrated normal chamber size and  normal global systolic function.  It was a suboptimal study due to  ventricular tachycardia. I could not identify any regional wall motion  abnormality inferiorly. A visual estimated ejection fraction is 65%.    There was a right dominant coronary system present.  The main left coronary  contained a 10% to 20% mid-vessel tubular stenosis.    The left anterior descending artery and its branches were minimally  diseased; there is some narrowing in the mid-portion about 30-40% in  magnitude. It extends over about 15-20 mm.  No significant diagonal branches  are present. There is a large ramus branch. The ongoing anterior descending  artery is diffusely diseased, transapical and widely patent.  The left circumflex coronary artery amounts to a large ramus intermedius and  a small circumflex itself. There are no significant obstructions within the  ramus or the circumflex itself.    The right coronary artery is a large vessel and contains a 20% narrowing in  the proximal portion before the proximal edge of the stented segment. The stented segment itself is diffusely thrombotic with multiple filling  defects. A gross estimate of the degree of obstruction would be in the 50-  60% range. The ongoing right coronary is widely patent. It gives rise to a  moderate-sized posterior descending artery and a large posterolateral  segment with several moderate-sized left ventricular branches. There are no  obstruction seen distally.    Following balloon dilatation and stent implantation, there was no residual  stenosis in the right coronary and the flow was TIMI grade III.    INTRAVASCULAR ULTRASOUND:  The distal reference vessel size following  initial stent placement was 3.1 x 3.3 mm with the square area of 7.93 mm2.  The mid-portion of the stented segment following high-pressure balloon  dilatation measured 2.9 x 3.4 mm and had a 7.81 mm2 area.    FINAL IMPRESSION:  1.  Atherosclerotic cardiovascular disease, single vessel.  2.  Status post successful percutaneous coronary intervention/bare-metal      stent implantation mid-right coronary      artery (stent sandwich).  3.  Acute inferior wall myocardial infarction aborted with intravenous      heparin.   4.  Intact left ventricular size and global systolic function. Ejection      fraction 65%.          Barnett Abu, M.D.  Electronically Signed    Laboratory Data:  High Sensitivity Troponin:  No results for input(s): TROPONINIHS in the last 720 hours.   Chemistry Recent Labs  Lab 11/03/21 1208 11/04/21 0455  NA 128* 128*  K 4.0 3.6  CL 97* 98  CO2 22 23  GLUCOSE 98 86  BUN 17 12  CREATININE 0.91 0.68  CALCIUM 8.8* 8.4*  MG 1.8 1.9  GFRNONAA >60 >60  ANIONGAP 9 7    Recent Labs  Lab 11/03/21 1208 11/04/21 0455  PROT 6.7 5.8*  ALBUMIN 2.7* 2.3*  AST 25 21  ALT 18 7  ALKPHOS 45 40  BILITOT 0.8 1.2   Lipids No results for input(s): CHOL, TRIG, HDL, LABVLDL, LDLCALC, CHOLHDL in the last 168 hours.  Hematology Recent Labs  Lab 11/03/21 1208 11/04/21 0455  WBC 2.8* 1.5*  RBC 2.78* 2.50*  HGB 9.5* 8.4*  HCT 28.4* 25.7*  MCV 102.2* 102.8*  MCH 34.2* 33.6  MCHC 33.5 32.7  RDW 14.6 14.6  PLT 158 141*   Thyroid No results for input(s): TSH, FREET4 in the last 168 hours.  BNP Recent Labs  Lab 11/03/21 1208  BNP 187.8*    DDimer No results for input(s): DDIMER in the last 168 hours.   Radiology/Studies:  DG Chest 2 View  Result Date: 11/03/2021 CLINICAL DATA:  Cough EXAM: CHEST - 2 VIEW COMPARISON:  CT chest 10/15/2021 FINDINGS: Cardiomegaly and mediastinum appear unchanged. Calcified plaques in the aortic arch. Left-sided atrial loop recorder. Right-sided central venous port with the tip in the region of the SVC. Heart and mediastinum are deviated to the left similar to previous. Extensive irregular opacities throughout the left lung again seen with relative sparing in the upper lung zone, similar to previous. Chronic changes in the right lung base. No significant  pleural effusion. No pneumothorax visualized. IMPRESSION: No significant change since previous study including extensive irregular opacities in the left lung. Electronically Signed   By: Ofilia Neas M.D.   On: 11/03/2021 10:55   VAS [place] LOWER EXTREMITY VENOUS (DVT) (7a-7p)  Result Date: 11/03/2021  Lower Venous DVT Study Patient Name:  Corey [name]  Date of Exam:   11/03/2021 Medical Rec #: [id number]    Accession #:    [id number] Date of Birth: 06/03/1946   Patient Gender: M Patient Age:   49 years Exam Location:  [facility] Procedure:      VAS [place] LOWER EXTREMITY VENOUS (DVT) Referring Phys: Thelma Comp NANAVATI --------------------------------------------------------------------------------  Indications: Pain.  Risk Factors: None identified. Comparison Study: No prior studies. Performing Technologist: Oliver Hum RVT  Examination Guidelines: A complete evaluation includes B-mode imaging, spectral Doppler, color Doppler, and power Doppler as needed of all accessible portions of each vessel. Bilateral testing is considered an integral part of a complete examination. Limited examinations for reoccurring indications may be performed as noted. The reflux portion of the exam is performed with the patient in reverse Trendelenburg.  +-----+---------------+---------+-----------+----------+--------------+  RIGHT Compressibility Phasicity Spontaneity Properties Thrombus Aging  +-----+---------------+---------+-----------+----------+--------------+  CFV   Full            Yes       Yes                                    +-----+---------------+---------+-----------+----------+--------------+   +---------+---------------+---------+-----------+----------+--------------+  LEFT      Compressibility Phasicity Spontaneity Properties Thrombus Aging  +---------+---------------+---------+-----------+----------+--------------+  CFV       Full            Yes       Yes                                    +---------+---------------+---------+-----------+----------+--------------+  SFJ       Full                                                              +---------+---------------+---------+-----------+----------+--------------+  FV Prox   Full                                                             +---------+---------------+---------+-----------+----------+--------------+  FV Mid    Full                                                             +---------+---------------+---------+-----------+----------+--------------+  FV Distal Full                                                             +---------+---------------+---------+-----------+----------+--------------+  PFV       Full                                                             +---------+---------------+---------+-----------+----------+--------------+  POP       Full            Yes       Yes                                    +---------+---------------+---------+-----------+----------+--------------+  PTV       Full                                                             +---------+---------------+---------+-----------+----------+--------------+  PERO      Full                                                             +---------+---------------+---------+-----------+----------+--------------+    Summary: RIGHT: - No evidence of common femoral vein obstruction.  LEFT: - There is no evidence of deep vein thrombosis in the lower extremity.  - No cystic structure found in the popliteal fossa.  *See table(s) above for measurements and observations. Electronically signed by Deitra Mayo MD on 11/03/2021 at 3:47:23 PM.    Final      Assessment and Plan:   1. Rapid atrial fibrillation, here with QT prolongation - given that his recent AF burden was 0% by ILR, suspect driven by acute medical issues - given his recurrent problems with QT prolongation and prior electrolyte derangement over the last hospitalizations, I am not convinced Tikosyn remains the best option for his arrhythmia control any longer - this was previously chosen over amiodarone due to age - per curbside with EP,  generally recommend at least 36-48 hr washout from Tikosyn to start amiodarone. However, this is not withstanding the issues he has with ongoing QT prolongation so amiodarone may be a difficult option as well - K 3.6 -> will give 26meq KCl today and increase standing K to 26meq daily - Mg 1.9 -> got MagOX 400mg  x1 this AM, will give another 1g Mag Sulfate and increase MagOx to BID - continue IV diltiazem for now along with Toprol 25mg  daily  - remains anticoagulated with Xarelto, continue to re-evaluate candidacy on a regular basis - urine is orange and he appears dry -> with poor PO intake for several days will give NS 100cc/hr x 5 hours  - will discuss further MD  2. Possible community acquired PNA occurring in the context of stage IV lung CA - presented with weakness, cough, pleuritic CP, chills, and poor appetite - abx per medicine team - recommend to avoid QT prolonging agents where possible - also noted to have worsening pancytopenia, hyponatremia which may be driving #1 - wife is also concerned for LLE  cellulitis, will defer to primary team   3. Coronary artery disease - no symptoms to suggest ACS this admission - not on ASA given Xarelto - continue BB - continue statin but consider stopping in future if signs of liver compromise with known mets  4. Essential HTN - with intermittent hypotension, managed in context above  5. 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  Risk Assessment/Risk Scores:          CHA2DS2-VASc Score = 5   This indicates a 7.2% annual risk of stroke. The patient's score is based upon: CHF History: 0 HTN History: 1 Diabetes History: 0 Stroke History: 2 Vascular Disease History: 1 Age Score: 1 Gender Score: 0      For questions or updates, please contact [facility] Please consult [email or web address] for contact info under    Signed, Corey Pitter, PA-C  11/04/2021 10:25 AM  Patient seen and examined.  Agree with above documentation.  Corey [name] is a 76 year old male with history of CAD status post PCI to RCA in 2000 14,007, paroxysmal atrial fibrillation on Tikosyn, CVA, stage IV lung cancer, prostate cancer, OSA, hypertension who we are consulted for evaluation of atrial fibrillation at the request of Dr. Sabino Gasser.  He follows with Dr. Acie Fredrickson and Dr. Rayann Heman.  He has been on Tikosyn and Xarelto for his A-fib.  Had loop recorder placed in May 2021 presyncope.  He was diagnosed with stage IV lung cancer this past year.  He is on palliative chemotherapy and radiotherapy.  Most recent echocardiogram 08/2021 showed EF 65 to 70%, mild to moderate RV dysfunction.  He was admitted on 1/31 with worsening weakness and cough.  Labs notable for procalcitonin 0.33, BNP 187, albumin 2.3, WBC 1.5, platelets 141, hemoglobin 8.4.  His Tikosyn was held due to prolonged QT.  Since admission he has been having intermittent A-fib with RVR.  EKG shows normal sinus rhythm with PACs, rate 96, QTc 546.  On exam, patient is alert and oriented, irregular, tachycardic, no murmurs, expiratory wheezing, trace LE edema.  Telemetry shows intermittent Afib with rates up to 140s.  For his atrial fibrillation with RVR, I agree with holding Tikosyn given his QT prolongation.  Can continue metoprolol and diltiazem for rate control.  Amiodarone may be a better option if his QT improves, though will need to be off Tikosyn for 36 to 48 hours before starting amiodarone.  Donato Heinz, MD

## 2021-11-04 NOTE — Assessment & Plan Note (Signed)
-   continue nightly CPAP °

## 2021-11-04 NOTE — Assessment & Plan Note (Addendum)
-   likely multifactorial from underlying infection plus recent chemo  -resolved

## 2021-11-04 NOTE — Assessment & Plan Note (Addendum)
-   Continue Lipitor, BB -No anginal symptoms

## 2021-11-04 NOTE — Assessment & Plan Note (Addendum)
-   follows outpatient with Dr. [name] -Palliative Care consulted and following. Pt has improved markedly thus far and pt is keen on improving further -Pt has not ruled out possibility of pursuing chemo moving forward

## 2021-11-04 NOTE — Assessment & Plan Note (Addendum)
-   BP remains stable and controlled at this time - continue monitoring

## 2021-11-04 NOTE — Assessment & Plan Note (Signed)
continue PPI

## 2021-11-04 NOTE — Progress Notes (Signed)
Pt declined cpap tonight due to nausea and vomiting.  Meds given by RN.  Cpap remains in room on standby as needed.  Pt encouraged to call should his condition improve and he wants to try cpap later tonight.  RN aware.

## 2021-11-05 DIAGNOSIS — C3492 Malignant neoplasm of unspecified part of left bronchus or lung: Secondary | ICD-10-CM | POA: Diagnosis not present

## 2021-11-05 DIAGNOSIS — R9431 Abnormal electrocardiogram [ECG] [EKG]: Secondary | ICD-10-CM | POA: Diagnosis not present

## 2021-11-05 DIAGNOSIS — J189 Pneumonia, unspecified organism: Secondary | ICD-10-CM | POA: Diagnosis not present

## 2021-11-05 DIAGNOSIS — I4891 Unspecified atrial fibrillation: Secondary | ICD-10-CM | POA: Diagnosis not present

## 2021-11-05 DIAGNOSIS — E871 Hypo-osmolality and hyponatremia: Secondary | ICD-10-CM

## 2021-11-05 LAB — CBC WITH DIFFERENTIAL/PLATELET
Abs Immature Granulocytes: 0.13 10*3/uL — ABNORMAL HIGH (ref 0.00–0.07)
Basophils Absolute: 0 10*3/uL (ref 0.0–0.1)
Basophils Relative: 1 %
Eosinophils Absolute: 0 10*3/uL (ref 0.0–0.5)
Eosinophils Relative: 2 %
HCT: 27.8 % — ABNORMAL LOW (ref 39.0–52.0)
Hemoglobin: 9.5 g/dL — ABNORMAL LOW (ref 13.0–17.0)
Immature Granulocytes: 6 %
Lymphocytes Relative: 11 %
Lymphs Abs: 0.2 10*3/uL — ABNORMAL LOW (ref 0.7–4.0)
MCH: 34.9 pg — ABNORMAL HIGH (ref 26.0–34.0)
MCHC: 34.2 g/dL (ref 30.0–36.0)
MCV: 102.2 fL — ABNORMAL HIGH (ref 80.0–100.0)
Monocytes Absolute: 0.5 10*3/uL (ref 0.1–1.0)
Monocytes Relative: 22 %
Neutro Abs: 1.2 10*3/uL — ABNORMAL LOW (ref 1.7–7.7)
Neutrophils Relative %: 58 %
Platelets: 120 10*3/uL — ABNORMAL LOW (ref 150–400)
RBC: 2.72 MIL/uL — ABNORMAL LOW (ref 4.22–5.81)
RDW: 14.6 % (ref 11.5–15.5)
WBC: 2.1 10*3/uL — ABNORMAL LOW (ref 4.0–10.5)
nRBC: 0 % (ref 0.0–0.2)

## 2021-11-05 LAB — BASIC METABOLIC PANEL
Anion gap: 10 (ref 5–15)
BUN: 15 mg/dL (ref 8–23)
CO2: 20 mmol/L — ABNORMAL LOW (ref 22–32)
Calcium: 8.4 mg/dL — ABNORMAL LOW (ref 8.9–10.3)
Chloride: 98 mmol/L (ref 98–111)
Creatinine, Ser: 0.73 mg/dL (ref 0.61–1.24)
GFR, Estimated: >60 mL/min (ref >60)
Glucose, Bld: 106 mg/dL — ABNORMAL HIGH (ref 70–99)
Potassium: 4 mmol/L (ref 3.5–5.1)
Sodium: 128 mmol/L — ABNORMAL LOW (ref 135–145)

## 2021-11-05 LAB — MAGNESIUM: Magnesium: 1.9 mg/dL (ref 1.7–2.4)

## 2021-11-05 LAB — LEGIONELLA PNEUMOPHILA SEROGP 1 UR AG: L. pneumophila Serogp 1 Ur Ag: NEGATIVE

## 2021-11-05 MED ORDER — ALUM & MAG HYDROXIDE-SIMETH 200-200-20 MG/5ML PO SUSP
30.0000 mL | Freq: Four times a day (QID) | ORAL | Status: DC | PRN
  Administered 2021-11-05: 30 mL via ORAL
  Filled 2021-11-05: qty 30

## 2021-11-05 MED ORDER — SORBITOL 70 % SOLN
30.0000 mL | Freq: Every day | Status: DC
  Administered 2021-11-05 – 2021-11-15 (×5): 30 mL via ORAL
  Filled 2021-11-05 (×13): qty 30

## 2021-11-05 MED ORDER — AMIODARONE HCL IN DEXTROSE 360-4.14 MG/200ML-% IV SOLN
30.0000 mg/h | INTRAVENOUS | Status: DC
  Administered 2021-11-05 – 2021-11-08 (×6): 30 mg/h via INTRAVENOUS
  Filled 2021-11-05 (×7): qty 200

## 2021-11-05 MED ORDER — LACTULOSE 10 GM/15ML PO SOLN
10.0000 g | Freq: Two times a day (BID) | ORAL | Status: DC
  Administered 2021-11-05 – 2021-11-20 (×16): 10 g via ORAL
  Filled 2021-11-05 (×23): qty 15

## 2021-11-05 MED ORDER — AMIODARONE HCL IN DEXTROSE 360-4.14 MG/200ML-% IV SOLN
60.0000 mg/h | INTRAVENOUS | Status: DC
  Administered 2021-11-05 (×3): 60 mg/h via INTRAVENOUS
  Filled 2021-11-05 (×2): qty 200

## 2021-11-05 NOTE — Plan of Care (Signed)
°  Problem: Education: Goal: Knowledge of disease or condition will improve Outcome: Progressing Goal: Understanding of medication regimen will improve Outcome: Progressing

## 2021-11-05 NOTE — Progress Notes (Signed)
Progress Note  Patient Name: Corey [name] Date of Encounter: 11/05/2021  [facility] Cardiologist: Mertie Moores, MD   Subjective   BP 102/70 this morning.  Creatinine stable at 0.7.  Denies chest pain or dyspnea  Inpatient Medications    Scheduled Meds:  acidophilus  1 capsule Oral Daily   arformoterol  15 mcg Nebulization BID   And   umeclidinium bromide  1 puff Inhalation Daily   atorvastatin  40 mg Oral QHS   budesonide  0.5 mg Nebulization BID   carbidopa-levodopa  1 tablet Oral 4 times per day   Carbinoxamine Maleate  6 mg Oral BID   Chlorhexidine Gluconate Cloth  6 each Topical Daily   fenofibrate  160 mg Oral Q1400   fluticasone  2 spray Each Nare Daily   folic acid  1 mg Oral Daily   guaiFENesin  1,200 mg Oral BID   ipratropium  2 spray Each Nare q AM   lactulose  10 g Oral BID   levalbuterol  0.63 mg Nebulization BID   magnesium oxide  400 mg Oral BID   metoprolol succinate  25 mg Oral Daily   pantoprazole  40 mg Oral Daily   polyethylene glycol  17 g Oral BID   potassium chloride  20 mEq Oral Q1400   rivaroxaban  20 mg Oral QPM   senna-docusate  1 tablet Oral BID   sodium chloride flush  3 mL Intravenous Q12H   sorbitol  30 mL Oral Daily   tamsulosin  0.4 mg Oral QPC supper   topiramate  100 mg Oral BID   Continuous Infusions:  sodium chloride     ceFEPime (MAXIPIME) IV Stopped (11/05/21 0537)   diltiazem (CARDIZEM) infusion 15 mg/hr (11/05/21 0251)   PRN Meds: sodium chloride, acetaminophen **OR** acetaminophen, albuterol, ALPRAZolam, benzonatate, chlorpheniramine-HYDROcodone, lip balm, metoCLOPramide (REGLAN) injection, nitroGLYCERIN, sodium chloride flush   Vital Signs    Vitals:   11/05/21 0600 11/05/21 0736 11/05/21 0800 11/05/21 0908  BP: (!) 100/58  124/74 102/70  Pulse: 91  (!) 139 (!) 127  Resp: 17  (!) 22   Temp:   (!) 97.5 F (36.4 C)   TempSrc:   Axillary   SpO2: 96% 98% 97%     Intake/Output Summary (Last 24 hours) at 11/05/2021  0936 Last data filed at 11/05/2021 0913 Gross per 24 hour  Intake 1184.53 ml  Output 675 ml  Net 509.53 ml   Last 3 Weights 10/28/2021 10/23/2021 10/20/2021  Weight (lbs) 228 lb 8 oz 229 lb 12.8 oz 229 lb 1.6 oz  Weight (kg) 103.647 kg 104.237 kg 103.919 kg      Telemetry    A-fib/a flutter, rates up to 140s- Personally Reviewed  ECG    No new ECG - Personally Reviewed  Physical Exam   GEN: No acute distress.   Neck: No JVD Cardiac: Tachycardic, regular no murmurs, rubs, or gallops.  Respiratory: Scattered expiratory wheezing GI: Soft, nontender MS: Trace edema Neuro:  Nonfocal  Psych: Normal affect   Labs    High Sensitivity Troponin:  No results for input(s): TROPONINIHS in the last 720 hours.   Chemistry Recent Labs  Lab 11/03/21 1208 11/04/21 0455 11/05/21 0445  NA 128* 128* 128*  K 4.0 3.6 4.0  CL 97* 98 98  CO2 22 23 20*  GLUCOSE 98 86 106*  BUN 17 12 15   CREATININE 0.91 0.68 0.73  CALCIUM 8.8* 8.4* 8.4*  MG 1.8 1.9 1.9  PROT  6.7 5.8*  --   ALBUMIN 2.7* 2.3*  --   AST 25 21  --   ALT 18 7  --   ALKPHOS 45 40  --   BILITOT 0.8 1.2  --   GFRNONAA >60 >60 >60  ANIONGAP 9 7 10     Lipids No results for input(s): CHOL, TRIG, HDL, LABVLDL, LDLCALC, CHOLHDL in the last 168 hours.  Hematology Recent Labs  Lab 11/03/21 1208 11/04/21 0455 11/05/21 0445  WBC 2.8* 1.5* 2.1*  RBC 2.78* 2.50* 2.72*  HGB 9.5* 8.4* 9.5*  HCT 28.4* 25.7* 27.8*  MCV 102.2* 102.8* 102.2*  MCH 34.2* 33.6 34.9*  MCHC 33.5 32.7 34.2  RDW 14.6 14.6 14.6  PLT 158 141* 120*   Thyroid No results for input(s): TSH, FREET4 in the last 168 hours.  BNP Recent Labs  Lab 11/03/21 1208  BNP 187.8*    DDimer No results for input(s): DDIMER in the last 168 hours.   Radiology    DG Chest 2 View  Result Date: 11/03/2021 CLINICAL DATA:  Cough EXAM: CHEST - 2 VIEW COMPARISON:  CT chest 10/15/2021 FINDINGS: Cardiomegaly and mediastinum appear unchanged. Calcified plaques in the aortic  arch. Left-sided atrial loop recorder. Right-sided central venous port with the tip in the region of the SVC. Heart and mediastinum are deviated to the left similar to previous. Extensive irregular opacities throughout the left lung again seen with relative sparing in the upper lung zone, similar to previous. Chronic changes in the right lung base. No significant pleural effusion. No pneumothorax visualized. IMPRESSION: No significant change since previous study including extensive irregular opacities in the left lung. Electronically Signed   By: Ofilia Neas M.D.   On: 11/03/2021 10:55   VAS [place] LOWER EXTREMITY VENOUS (DVT) (7a-7p)  Result Date: 11/03/2021  Lower Venous DVT Study Patient Name:  Corey [name]  Date of Exam:   11/03/2021 Medical Rec #: [id number]    Accession #:    [id number] Date of Birth: 1946/05/14   Patient Gender: M Patient Age:   76 years Exam Location:  [facility] Procedure:      VAS [place] LOWER EXTREMITY VENOUS (DVT) Referring Phys: Thelma Comp NANAVATI --------------------------------------------------------------------------------  Indications: Pain.  Risk Factors: None identified. Comparison Study: No prior studies. Performing Technologist: Oliver Hum RVT  Examination Guidelines: A complete evaluation includes B-mode imaging, spectral Doppler, color Doppler, and power Doppler as needed of all accessible portions of each vessel. Bilateral testing is considered an integral part of a complete examination. Limited examinations for reoccurring indications may be performed as noted. The reflux portion of the exam is performed with the patient in reverse Trendelenburg.  +-----+---------------+---------+-----------+----------+--------------+  RIGHT Compressibility Phasicity Spontaneity Properties Thrombus Aging  +-----+---------------+---------+-----------+----------+--------------+  CFV   Full            Yes       Yes                                     +-----+---------------+---------+-----------+----------+--------------+   +---------+---------------+---------+-----------+----------+--------------+  LEFT      Compressibility Phasicity Spontaneity Properties Thrombus Aging  +---------+---------------+---------+-----------+----------+--------------+  CFV       Full            Yes       Yes                                    +---------+---------------+---------+-----------+----------+--------------+  SFJ       Full                                                             +---------+---------------+---------+-----------+----------+--------------+  FV Prox   Full                                                             +---------+---------------+---------+-----------+----------+--------------+  FV Mid    Full                                                             +---------+---------------+---------+-----------+----------+--------------+  FV Distal Full                                                             +---------+---------------+---------+-----------+----------+--------------+  PFV       Full                                                             +---------+---------------+---------+-----------+----------+--------------+  POP       Full            Yes       Yes                                    +---------+---------------+---------+-----------+----------+--------------+  PTV       Full                                                             +---------+---------------+---------+-----------+----------+--------------+  PERO      Full                                                             +---------+---------------+---------+-----------+----------+--------------+    Summary: RIGHT: - No evidence of common femoral vein obstruction.  LEFT: - There is no evidence of deep vein thrombosis in the lower extremity.  - No cystic structure found in the popliteal fossa.  *See table(s) above for measurements and observations. Electronically signed  by Deitra Mayo MD on 11/03/2021 at 3:47:23 PM.  Final     Cardiac Studies     Patient Profile     76 y.o. male  with a hx of CAD s/p remote PCIs of RCA 2004 and 2007 with otherwise mild nonbstructive CAD, CVA, paroxysmal atrial fibrillation on Tikosyn, ILR in place, stage IV lung CA, HTN, HLD, prostate CA, OSA on CPAP, mild carotid artery disease (0-04% RICA, 59-97% LICA by duplex 74/1423), prior anemia requiring transfusions, mild dilation of ascending aorta we are consulted for A-fib with RVR.  Assessment & Plan     Rapid atrial fibrillation, here with QT prolongation: given that his recent AF burden was 0% by ILR, suspect driven by acute medical issues.  Echocardiogram 08/04/2021 showed EF 65 to 70%, mild to moderate RV dysfunction, no significant valvular disease -Tikosyn discontinued given QT prolongation -He is on IV diltiazem along with Toprol-XL for rate control.  Rates remain elevated -Discussed further with EP today: manually calculated his QTc when in sinus yesterday and was 490.  [place] with starting amiodarone now.  Will start on amio drip.  Hopefully can maintain sinus rhythm on amio  Possible community acquired PNA occurring in the context of stage IV lung CA - presented with weakness, cough, pleuritic CP, chills, and poor appetite - abx per medicine team - recommend to avoid QT prolonging agents where possible - also noted to have worsening pancytopenia, hyponatremia - wife is also concerned for LLE cellulitis, will defer to primary team    Coronary artery disease - no symptoms to suggest ACS this admission - not on ASA given Xarelto - continue BB - continue statin but consider stopping in future if signs of liver compromise with known mets   Essential HTN - with intermittent hypotension, managed in context above   Mild-moderate carotid artery disease by duplex last in 2021, 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

## 2021-11-05 NOTE — Progress Notes (Signed)
Carelink Summary Report / Loop Recorder 

## 2021-11-05 NOTE — Progress Notes (Signed)
Progress Note   Patient: Corey [name]:[id number] DOB: [birth year] DOA: 11/03/2021     2 DOS: the patient was seen and examined on 11/05/2021   Brief hospital course: Corey [name] is a 76 y.o. male with PMH stage IV NSCLC with liver mets, PAF, CAD, chronic low back p ain, anemia, anxiety, osteoarthritis, HLD, HTN, GERD, OSA on CPAP, prostate cancer who presented with approximately 4 days of progressively worsening weakness and ongoing productive cough of yellow sputum with associated pleuritic chest pain, malaise, dyspnea.  He recently deceived chemotherapy within the past week as well.  Due to continued symptoms of feeling poorly, he presented for further evaluation. On evaluation in the ER, he underwent CXR which showed ongoing extensive irregular opacities involving the left lung not significantly changed from prior imaging.  Prior CT chest on 10/15/2021 showed a stable dense consolidation in the lingula and left lower lobe. Notable labs included WBC 2.8, platelet count 158.  Sodium 128.  BNP 187. There was concern for possible pneumonia and he was started on vancomycin and cefepime. He also developed A. fib with RVR and was started on a Cardizem drip.  Assessment and Plan: * Atrial fibrillation with RVR ([place])- (present on admission) - on Tikosyn, Toprol, and Xarelto at home - Patient noted to be in A. fib with RVR on admission - s/p cardizem drip on admission but BP low/normal with prolonged QTc - cardiology consulted for further assistance given complexity of med regimen and prolonged QTc - given calculated QTc, cardiology transitioning to amio drip at this time - follow up RVR response   CAP (community acquired pneumonia)- (present on admission) - ongoing LLL consolidation which may be acutely worse; symptomatically he feels worse over the past ~1 week - s/p vanc and cefepime on admission; MRSA screen negative, okay to d/c vanc and continue monotherapy cefepime at this time - trend PCT -  follow up cultures (blood cultures remain negative)  Non-small cell carcinoma of left lung, stage 4 (HCC)- (present on admission) - follows outpatient with Dr. Earlie [name]  Prolonged Q-T interval on ECG - tikosyn now officially discontinued per cardiology - continue monitoring electrolytes; goal K>4, Mg>2  Leukopenia- (present on admission) - likely from underlying possible infection vs recent chemo use   Hyponatremia- (present on admission) - appeared clinically volume depleted - s/p IVF - BMP in am   Macrocytic anemia- (present on admission) - no overt bleeding - downtrend suspected in setting of chemo recently   OSA on CPAP - continue nightly CPAP  GERD (gastroesophageal reflux disease)- (present on admission) - continue PPI  Dyslipidemia- (present on admission) - Continue Lipitor  Benign essential HTN- (present on admission) - BP soft after cardizem drip started - continue monitoring   Coronary artery disease- (present on admission) - Continue Lipitor, Toprol -On Xarelto for underlying A. fib        Subjective: No events overnight.  Resting comfortably in bed, heart rate a little improved but still tachycardic.  Denies any chest pain.  Physical Exam: Vitals:   11/05/21 1300 11/05/21 1330 11/05/21 1400 11/05/21 1430  BP: 99/77 118/70 118/75   Pulse: 87 (!) 113 (!) 139   Resp: 19 (!) 29 (!) 25 (!) 25  Temp:      TempSrc:      SpO2: 99% 94% 97%   Physical Exam Constitutional:      General: He is not in acute distress.    Appearance: Normal appearance.     Comments: Lethargic  appearing  HENT:     Head: Normocephalic and atraumatic.     Mouth/Throat:     Mouth: Mucous membranes are moist.  Eyes:     Extraocular Movements: Extraocular movements intact.  Cardiovascular:     Rate and Rhythm: Tachycardia present. Rhythm irregular.     Heart sounds: Normal heart sounds.  Pulmonary:     Effort: Pulmonary effort is normal.     Breath sounds: Rhonchi  present. No wheezing.  Abdominal:     General: Bowel sounds are normal. There is no distension.     Palpations: Abdomen is soft.     Tenderness: There is no abdominal tenderness.  Musculoskeletal:        General: Normal range of motion.     Cervical back: Normal range of motion and neck supple.     Comments: 1+ B/L LE edema  Skin:    General: Skin is warm and dry.  Neurological:     General: No focal deficit present.     Mental Status: He is alert.  Psychiatric:        Mood and Affect: Mood normal.        Behavior: Behavior normal.    Data Reviewed:  I have Reviewed nursing notes, Vitals, and Lab results since pt's last encounter. Pertinent lab results WBC 2.1, Na 128 I have ordered test including BMP, CBC I have reviewed the last note from all staff,  I have discussed pt's care plan and test results with nursing staff, CM.   Family Communication:    Code Status: Full Code   Disposition: Status is: Inpatient Remains inpatient appropriate because: Ongoing pneumonia treatment and A. fib with RVR treatment   Planned Discharge Destination: Home  DVT ppx: Xarelto     Author: Dwyane Dee, MD 11/05/2021 2:56 PM  For on call review [email or web address].

## 2021-11-06 ENCOUNTER — Telehealth: Payer: Self-pay | Admitting: Medical Oncology

## 2021-11-06 ENCOUNTER — Inpatient Hospital Stay (HOSPITAL_COMMUNITY): Payer: Medicare PPO

## 2021-11-06 DIAGNOSIS — I2510 Atherosclerotic heart disease of native coronary artery without angina pectoris: Secondary | ICD-10-CM

## 2021-11-06 DIAGNOSIS — E871 Hypo-osmolality and hyponatremia: Secondary | ICD-10-CM | POA: Diagnosis not present

## 2021-11-06 DIAGNOSIS — J189 Pneumonia, unspecified organism: Secondary | ICD-10-CM | POA: Diagnosis not present

## 2021-11-06 DIAGNOSIS — I872 Venous insufficiency (chronic) (peripheral): Secondary | ICD-10-CM

## 2021-11-06 DIAGNOSIS — M7989 Other specified soft tissue disorders: Secondary | ICD-10-CM | POA: Diagnosis not present

## 2021-11-06 DIAGNOSIS — I4891 Unspecified atrial fibrillation: Secondary | ICD-10-CM | POA: Diagnosis not present

## 2021-11-06 DIAGNOSIS — R9431 Abnormal electrocardiogram [ECG] [EKG]: Secondary | ICD-10-CM | POA: Diagnosis not present

## 2021-11-06 LAB — CBC WITH DIFFERENTIAL/PLATELET
Abs Immature Granulocytes: 0.11 10*3/uL — ABNORMAL HIGH (ref 0.00–0.07)
Basophils Absolute: 0 10*3/uL (ref 0.0–0.1)
Basophils Relative: 1 %
Eosinophils Absolute: 0.1 10*3/uL (ref 0.0–0.5)
Eosinophils Relative: 3 %
HCT: 27 % — ABNORMAL LOW (ref 39.0–52.0)
Hemoglobin: 9 g/dL — ABNORMAL LOW (ref 13.0–17.0)
Immature Granulocytes: 4 %
Lymphocytes Relative: 9 %
Lymphs Abs: 0.2 10*3/uL — ABNORMAL LOW (ref 0.7–4.0)
MCH: 34.1 pg — ABNORMAL HIGH (ref 26.0–34.0)
MCHC: 33.3 g/dL (ref 30.0–36.0)
MCV: 102.3 fL — ABNORMAL HIGH (ref 80.0–100.0)
Monocytes Absolute: 0.7 10*3/uL (ref 0.1–1.0)
Monocytes Relative: 28 %
Neutro Abs: 1.4 10*3/uL — ABNORMAL LOW (ref 1.7–7.7)
Neutrophils Relative %: 55 %
Platelets: 93 10*3/uL — ABNORMAL LOW (ref 150–400)
RBC: 2.64 MIL/uL — ABNORMAL LOW (ref 4.22–5.81)
RDW: 14.7 % (ref 11.5–15.5)
WBC: 2.6 10*3/uL — ABNORMAL LOW (ref 4.0–10.5)
nRBC: 0.8 % — ABNORMAL HIGH (ref 0.0–0.2)

## 2021-11-06 LAB — BASIC METABOLIC PANEL
Anion gap: 7 (ref 5–15)
BUN: 14 mg/dL (ref 8–23)
CO2: 21 mmol/L — ABNORMAL LOW (ref 22–32)
Calcium: 8.4 mg/dL — ABNORMAL LOW (ref 8.9–10.3)
Chloride: 96 mmol/L — ABNORMAL LOW (ref 98–111)
Creatinine, Ser: 0.72 mg/dL (ref 0.61–1.24)
GFR, Estimated: >60 mL/min (ref >60)
Glucose, Bld: 123 mg/dL — ABNORMAL HIGH (ref 70–99)
Potassium: 4.1 mmol/L (ref 3.5–5.1)
Sodium: 124 mmol/L — ABNORMAL LOW (ref 135–145)

## 2021-11-06 LAB — OSMOLALITY: Osmolality: 266 mOsm/kg — ABNORMAL LOW (ref 275–295)

## 2021-11-06 LAB — CREATININE, URINE, RANDOM: Creatinine, Urine: 111.41 mg/dL

## 2021-11-06 LAB — MAGNESIUM: Magnesium: 1.8 mg/dL (ref 1.7–2.4)

## 2021-11-06 LAB — SODIUM, URINE, RANDOM: Sodium, Ur: <10 mmol/L

## 2021-11-06 MED ORDER — SODIUM CHLORIDE 0.9 % IV SOLN: INTRAVENOUS | Status: DC

## 2021-11-06 MED ORDER — METOPROLOL TARTRATE 25 MG PO TABS
25.0000 mg | ORAL_TABLET | Freq: Two times a day (BID) | ORAL | Status: DC
  Administered 2021-11-06 – 2021-11-11 (×9): 25 mg via ORAL
  Filled 2021-11-06 (×10): qty 1

## 2021-11-06 MED ORDER — ORAL CARE MOUTH RINSE
15.0000 mL | Freq: Two times a day (BID) | OROMUCOSAL | Status: DC
  Administered 2021-11-06 – 2021-11-08 (×6): 15 mL via OROMUCOSAL

## 2021-11-06 MED ORDER — ALBUMIN HUMAN 25 % IV SOLN
12.5000 g | Freq: Four times a day (QID) | INTRAVENOUS | Status: AC
  Administered 2021-11-06 – 2021-11-08 (×8): 12.5 g via INTRAVENOUS
  Filled 2021-11-06 (×8): qty 50

## 2021-11-06 MED ORDER — MAGNESIUM SULFATE IN D5W 1-5 GM/100ML-% IV SOLN
1.0000 g | Freq: Once | INTRAVENOUS | Status: AC
  Administered 2021-11-06: 1 g via INTRAVENOUS
  Filled 2021-11-06: qty 100

## 2021-11-06 NOTE — Assessment & Plan Note (Addendum)
-   more prominent on LLE; no overt cellulitis signs as nontender as well - B/L LE duplex negative for DVT as well, performed 11/06/21 - likely caused by ongoing LE edema - some improvement in LE edema with lasix

## 2021-11-06 NOTE — Progress Notes (Addendum)
Progress Note  Patient Name: Corey [name] Date of Encounter: 11/06/2021  Primary Cardiologist: Mertie Moores, MD  Subjective   Feeling weak, nauseated, has productive sounding cough. Remains in atrial fib with HR low 100s-110s.  Inpatient Medications    Scheduled Meds:  acidophilus  1 capsule Oral Daily   arformoterol  15 mcg Nebulization BID   And   umeclidinium bromide  1 puff Inhalation Daily   atorvastatin  40 mg Oral QHS   budesonide  0.5 mg Nebulization BID   carbidopa-levodopa  1 tablet Oral 4 times per day   Carbinoxamine Maleate  6 mg Oral BID   Chlorhexidine Gluconate Cloth  6 each Topical Daily   fenofibrate  160 mg Oral Q1400   fluticasone  2 spray Each Nare Daily   folic acid  1 mg Oral Daily   guaiFENesin  1,200 mg Oral BID   ipratropium  2 spray Each Nare q AM   lactulose  10 g Oral BID   levalbuterol  0.63 mg Nebulization BID   magnesium oxide  400 mg Oral BID   mouth rinse  15 mL Mouth Rinse BID   metoprolol tartrate  25 mg Oral BID   pantoprazole  40 mg Oral Daily   polyethylene glycol  17 g Oral BID   potassium chloride  20 mEq Oral Q1400   rivaroxaban  20 mg Oral QPM   senna-docusate  1 tablet Oral BID   sodium chloride flush  3 mL Intravenous Q12H   sorbitol  30 mL Oral Daily   tamsulosin  0.4 mg Oral QPC supper   topiramate  100 mg Oral BID   Continuous Infusions:  sodium chloride     sodium chloride     amiodarone 30 mg/hr (11/06/21 0714)   ceFEPime (MAXIPIME) IV Stopped (11/06/21 0625)   diltiazem (CARDIZEM) infusion 15 mg/hr (11/06/21 0714)   PRN Meds: sodium chloride, acetaminophen **OR** acetaminophen, albuterol, ALPRAZolam, alum & mag hydroxide-simeth, benzonatate, chlorpheniramine-HYDROcodone, lip balm, metoCLOPramide (REGLAN) injection, nitroGLYCERIN, sodium chloride flush   Vital Signs    Vitals:   11/06/21 0630 11/06/21 0645 11/06/21 0714 11/06/21 0743  BP: 115/66 (!) 110/57    Pulse:  (!) 127 (!) 108   Resp: 19 (!) 21 18    Temp:      TempSrc:      SpO2: (!) 84% 100% 98% 98%    Intake/Output Summary (Last 24 hours) at 11/06/2021 0813 Last data filed at 11/06/2021 0714 Gross per 24 hour  Intake 1245.11 ml  Output 1075 ml  Net 170.11 ml   Last 3 Weights 10/28/2021 10/23/2021 10/20/2021  Weight (lbs) 228 lb 8 oz 229 lb 12.8 oz 229 lb 1.6 oz  Weight (kg) 103.647 kg 104.237 kg 103.919 kg     Telemetry    Atrial fib, brief paroxysms of atrial flutter - Personally Reviewed  Physical Exam   GEN: No acute distress. Pale. HEENT: Normocephalic, atraumatic, sclera non-icteric. Neck: No JVD or bruits. Cardiac: Irregularly irregular, no murmurs, rubs, or gallops.  Respiratory: Coarse, rhonchorous bilaterally without wheezing or rales. Breathing is unlabored. GI: Soft, nontender, non-distended, BS +x 4. MS: no deformity. Extremities: No clubbing or cyanosis. 1+ LLE edema with mild erythema, no significant edema on the right. Neuro:  AAOx3. Follows commands. Psych:  Responds to questions appropriately with a normal affect.  Labs    High Sensitivity Troponin:  No results for input(s): TROPONINIHS in the last 720 hours.    Cardiac EnzymesNo results for  input(s): TROPONINI in the last 168 hours. No results for input(s): TROPIPOC in the last 168 hours.   Chemistry Recent Labs  Lab 11/03/21 1208 11/04/21 0455 11/05/21 0445 11/06/21 0630  NA 128* 128* 128* 124*  K 4.0 3.6 4.0 4.1  CL 97* 98 98 96*  CO2 22 23 20* 21*  GLUCOSE 98 86 106* 123*  BUN 17 12 15 14   CREATININE 0.91 0.68 0.73 0.72  CALCIUM 8.8* 8.4* 8.4* 8.4*  PROT 6.7 5.8*  --   --   ALBUMIN 2.7* 2.3*  --   --   AST 25 21  --   --   ALT 18 7  --   --   ALKPHOS 45 40  --   --   BILITOT 0.8 1.2  --   --   GFRNONAA >60 >60 >60 >60  ANIONGAP 9 7 10 7      Hematology Recent Labs  Lab 11/04/21 0455 11/05/21 0445 11/06/21 0630  WBC 1.5* 2.1* 2.6*  RBC 2.50* 2.72* 2.64*  HGB 8.4* 9.5* 9.0*  HCT 25.7* 27.8* 27.0*  MCV 102.8* 102.2* 102.3*   MCH 33.6 34.9* 34.1*  MCHC 32.7 34.2 33.3  RDW 14.6 14.6 14.7  PLT 141* 120* 93*    BNP Recent Labs  Lab 11/03/21 1208  BNP 187.8*     DDimer No results for input(s): DDIMER in the last 168 hours.   Radiology    No results found.  Cardiac Studies   2D echo 08/2021  1. Left ventricular ejection fraction, by estimation, is 65 to 70%. The  left ventricle has normal function. The left ventricle has no regional  wall motion abnormalities. There is mild left ventricular hypertrophy.  Left ventricular diastolic parameters  are indeterminate.   2. Right ventricular systolic function mild to moderately reduced. The  right ventricular size is mildly enlarged. There is normal pulmonary  artery systolic pressure.   3. Trivial mitral valve regurgitation.   4. The aortic valve is tricuspid. Aortic valve regurgitation is not  visualized. Mild aortic valve sclerosis is present, with no evidence of  aortic valve stenosis.   5. Aortic dilatation noted. There is mild dilatation of the ascending  aorta, measuring 40 mm.   6. The inferior vena cava is normal in size with greater than 50%  respiratory variability, suggesting right atrial pressure of 3 mmHg.   Patient Profile     76 y.o. male with CAD s/p remote PCIs of RCA 2004 and 2007 with otherwise mild nonbstructive CAD, stage 4 lung CA, CVA, paroxysmal atrial fibrillation on Tikosyn, ILR in place, QT prolongation, HTN, HLD, prostate CA, OSA on CPAP, mild carotid artery disease (6-31% RICA, 49-70% LICA by duplex 26/3785), prior anemia requiring transfusions (felt chemo related), mild dilation of ascending aorta   Assessment & Plan   1. Possible community acquired PNA occurring in the context of stage IV lung CA, also worsening hyponatremia and pancytopenia - presented with weakness, cough, pleuritic CP, chills, and poor appetite - abx per medicine team - recommend to avoid QT prolonging agents where possible - also noted to have  worsening pancytopenia, hyponatremia which may be driving atrial fib  - LLE is noted to be mildly erythematous and edematous today - reviewed with Dr. Sabino Gasser; he will assess today and consider venous duplex based on his assessment. Although he is on Xarelto, with metastatic CA, DVT is still in differential - further management per primary team    2. Rapid atrial fibrillation, here  with QT prolongation - given that his recent AF burden was 0% by ILR, suspect driven by acute medical issues - Tikosyn stopped due to issues with recurrent QT prolongation and prior electrolyte derangement over the last hospitalizations - started on IV amiodarone yesterday, also remains on IV diltiazem, added Lopressor today -> will wean off diltiazem - remains anticoagulated with Xarelto, continue to re-evaluate candidacy on a regular basis - get EKG this AM to f/u QT interval - addendum - EKG performed, QTc estimated at 452ms by tracing  - K 4.1, Mg 1.8 -> will give another 1g mag sulfate today in addition to continued Mag Ox 400mg  BID  3. Coronary artery disease - no symptoms to suggest ACS this admission - not on ASA given Xarelto - continue BB - continue statin but consider stopping in future if signs of liver compromise with known mets   4. Essential HTN - with intermittent hypotension, managed in context above   5. Mild-moderate carotid artery disease by duplex last in 2021, mild dilation of ascending aorta 03/2021 - follow clinically for now, plan conservative management given comorbidities  For questions or updates, please contact [place] Please consult [email or web address] for contact info under Cardiology/STEMI.  Signed, Charlie Pitter, PA-C 11/06/2021, 8:13 AM     Patient seen and examined.  Agree with above documentation.  On exam, patient is alert and oriented, irregular, tachycardic, no murmurs, diminished breath sounds, 1+ lower extremity edema.  Rates improved today, currently 100s to 110s.   Continue IV amiodarone.  We will start Lopressor and plan to wean off diltiazem drip.  He was started on maintenance fluids, but appears mildly hypervolemic on exam, discussed with Dr Sabino Gasser and would hold off on fluids for now.  Albumin is 2.3 and likely contributing to edema, would give albumin and monitor response.  Donato Heinz, MD

## 2021-11-06 NOTE — Progress Notes (Signed)
BLE venous duplex has been completed.   Results can be found under chart review under CV PROC. 11/06/2021 11:03 AM Diera Wirkkala RVT, RDMS

## 2021-11-06 NOTE — Progress Notes (Signed)
Transition of Care [facility]) Screening Note  Patient Details  Name: DONACIANO RANGE Date of Birth: November 09, 1945  [facility] of [facility] CM/SW Contact:    Sherie Don, LCSW Phone Number: 11/06/2021, 11:34 AM  Transition of Care Department [facility]) has reviewed patient and no TOC needs have been identified at this time. We will continue to monitor patient advancement through interdisciplinary progression rounds. If new patient transition needs arise, please place a TOC consult.

## 2021-11-06 NOTE — Assessment & Plan Note (Signed)
-   neutropenia, tachycardia, tachypnea, lung source suspected - see pna treatment

## 2021-11-06 NOTE — Progress Notes (Signed)
Pharmacy Antibiotic Note  Corey [name] is a 76 y.o. male admitted on 11/03/2021 with pneumonia.  Pharmacy has been consulted for cefepime and vancomycin dosing.  Day 4 cefepime Afebrile WBC 2.6, note that chemo was given on 1/25 Blood cultures no growth to date  Plan: Continue Cefepime 2 g IV every 8 hours Monitor clinical progress, renal function, and vancomycin levels if indicated F/U C&S, abx deescalation / LOT     Temp (24hrs), Avg:98.1 F (36.7 C), Min:97.5 F (36.4 C), Max:99 F (37.2 C)  Recent Labs  Lab 11/03/21 1208 11/03/21 1229 11/03/21 1515 11/04/21 0455 11/05/21 0445 11/06/21 0630  WBC 2.8*  --   --  1.5* 2.1* 2.6*  CREATININE 0.91  --   --  0.68 0.73 0.72  LATICACIDVEN  --  1.1 0.9  --   --   --      Estimated Creatinine Clearance: 102.4 mL/min (by C-G formula based on SCr of 0.72 mg/dL).    Allergies  Allergen Reactions   Amoxicillin Anaphylaxis and Swelling    Tolerates Rocephin   Fosinopril Other (See Comments) and Cough    Muscle aches   Hydromorphone Nausea And Vomiting   Ampicillin Rash   Codeine Nausea Only and Other (See Comments)    Heavy amounts cause nausea and stomach upset   Monopril [Fosinopril Sodium] Other (See Comments)    Muscle aches & pain   Rosuvastatin Other (See Comments)    Joint pain    Antimicrobials this admission: 1/31 cefepime >>  1/31 vancomycin >> 2/1  Dose adjustments this admission:   Microbiology results: 1/31 BCx: ngtd 1/31 MRSA PCR: negative  Thank you for allowing pharmacy to be a part of this patients care.  Adrian Saran, PharmD, BCPS Secure Chat if ?s 11/06/2021 8:30 AM

## 2021-11-06 NOTE — Telephone Encounter (Signed)
Perez is in ICU with "extreme atrial  fibrillation".  Per his wife "the chemo is causing a vicious cycle and he ends up in the hospital after his treatments ".  Is xrt or oral chemo an option ?  Next appt 02/15.

## 2021-11-06 NOTE — Progress Notes (Addendum)
Progress Note   Patient: Corey [name] KZL:[id number] DOB: [birth year] DOA: 11/03/2021     3 DOS: the patient was seen and examined on 11/06/2021   Brief hospital course: Corey [name] is a 76 y.o. male with PMH stage IV NSCLC with liver mets, PAF, CAD, chronic low back p ain, anemia, anxiety, osteoarthritis, HLD, HTN, GERD, OSA on CPAP, prostate cancer who presented with approximately 4 days of progressively worsening weakness and ongoing productive cough of yellow sputum with associated pleuritic chest pain, malaise, dyspnea.  He recently deceived chemotherapy within the past week as well.  Due to continued symptoms of feeling poorly, he presented for further evaluation. On evaluation in the ER, he underwent CXR which showed ongoing extensive irregular opacities involving the left lung not significantly changed from prior imaging.  Prior CT chest on 10/15/2021 showed a stable dense consolidation in the lingula and left lower lobe. Notable labs included WBC 2.8, platelet count 158.  Sodium 128.  BNP 187. There was concern for possible pneumonia and he was started on vancomycin and cefepime. He also developed A. fib with RVR and was started on a Cardizem drip.  Assessment and Plan: * Atrial fibrillation with RVR ([place])- (present on admission) - on Tikosyn, Toprol, and Xarelto at home - Patient noted to be in A. fib with RVR on admission - s/p cardizem drip on admission but BP low/normal with prolonged QTc - cardiology consulted for further assistance given complexity of med regimen and prolonged QTc - given calculated QTc, cardiology transitioning to amio drip at this time and continuing cardizem  - follow up RVR response   CAP (community acquired pneumonia)- (present on admission) - ongoing LLL consolidation which may be acutely worse; symptomatically he feels worse over the past ~1 week - s/p vanc and cefepime on admission; MRSA screen negative, okay to d/c vanc and continue monotherapy cefepime at  this time - trend PCT - follow up cultures (blood cultures remain negative)  Prolonged Q-T interval on ECG - tikosyn now officially discontinued per cardiology - continue monitoring electrolytes; goal K>4, Mg>2  Hyponatremia- (present on admission) - appeared clinically volume depleted - unfortunately edema continues to worsen some since admission; given hypoalbuminemia he is likely 3rd spacing. FeNa calculated <<1% on 11/06/21 consistent with intravascular volume depletion and worsening hyponatremia - ideally needs intravascular repletion but will need to correct some of albumin first in efforts to maximize volume status; initially started NS on 2/3 but will stop this and give albumin first and assess response (hopefully this will be enough to shift volume back intravascularly without need for IVF) - trend BMP  Non-small cell carcinoma of left lung, stage 4 ([place])- (present on admission) - follows outpatient with Dr. 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; no overt cellulitis signs as nontender as well - B/L LE duplex negative for DVT as well, performed 11/06/21 - likely caused by ongoing LE edema  Leukopenia- (present on admission) - likely multifactorial from underlying infection plus recent chemo   Macrocytic anemia- (present on admission) - no overt bleeding - downtrend suspected in setting of chemo recently   Sepsis ([place])- (present on admission) - neutropenia, tachycardia, tachypnea, lung source suspected - see pna treatment   OSA on CPAP - continue nightly CPAP  GERD (gastroesophageal reflux disease)- (present on admission) - continue PPI  Dyslipidemia- (present on admission) - Continue Lipitor  Benign essential HTN- (present on admission) - BP soft after cardizem drip started - continue monitoring  Coronary artery disease- (present on admission) - Continue Lipitor, Toprol -On Xarelto for underlying A. fib     Subjective: No acute events. Weakness  is maybe a little worse today. Legs have more swelling today with some redness appreciated in the LLE. We discussed getting LE duplex and possibly starting fluids (instead we'll do albumin as noted in A&P).   Physical Exam: Vitals:   11/06/21 1100 11/06/21 1200 11/06/21 1230 11/06/21 1300  BP: (!) 92/58 103/64 95/68 99/68   Pulse: (!) 127 (!) 131 (!) 130 (!) 131  Resp: 16 17 15  (!) 25  Temp:      TempSrc:      SpO2: 94% 98% 97% 99%  Physical Exam Constitutional:      General: He is not in acute distress.    Appearance: Normal appearance.     Comments: Lethargic appearing  HENT:     Head: Normocephalic and atraumatic.     Mouth/Throat:     Mouth: Mucous membranes are moist.  Eyes:     Extraocular Movements: Extraocular movements intact.  Cardiovascular:     Rate and Rhythm: Tachycardia present.     Heart sounds: Normal heart sounds.  Pulmonary:     Effort: Pulmonary effort is normal.     Breath sounds: Rhonchi present. No wheezing.  Abdominal:     General: Bowel sounds are normal. There is no distension.     Palpations: Abdomen is soft.     Tenderness: There is no abdominal tenderness.  Musculoskeletal:        General: Normal range of motion.     Cervical back: Normal range of motion and neck supple.     Comments: 2+ B/L LE edema. Mild LLE erythema, no tenderness  Skin:    General: Skin is warm and dry.  Neurological:     General: No focal deficit present.     Mental Status: He is alert.  Psychiatric:        Mood and Affect: Mood normal.        Behavior: Behavior normal.    Data Reviewed:  I have Reviewed nursing notes, Vitals, and Lab results since pt's last encounter. Pertinent lab results WBC 2.6, Hgb 9 g/dL Na 124 I have ordered test including BMP, CBC I have reviewed the last note from staff over past 24 hours,  I have discussed pt's care plan and test results with nursing staff, CM.   Family Communication:    Code Status: Full Code   Disposition: Status  is: Inpatient Remains inpatient appropriate because: Ongoing pneumonia treatment and A. fib with RVR treatment   Planned Discharge Destination: Home  DVT ppx: Xarelto    Author: Dwyane Dee, MD 11/06/2021 2:25 PM  For on call review [email or web address].

## 2021-11-07 DIAGNOSIS — I4891 Unspecified atrial fibrillation: Secondary | ICD-10-CM | POA: Diagnosis not present

## 2021-11-07 DIAGNOSIS — E871 Hypo-osmolality and hyponatremia: Secondary | ICD-10-CM | POA: Diagnosis not present

## 2021-11-07 DIAGNOSIS — J189 Pneumonia, unspecified organism: Secondary | ICD-10-CM | POA: Diagnosis not present

## 2021-11-07 LAB — BASIC METABOLIC PANEL
Anion gap: 7 (ref 5–15)
Anion gap: 7 (ref 5–15)
BUN: 10 mg/dL (ref 8–23)
BUN: 10 mg/dL (ref 8–23)
CO2: 23 mmol/L (ref 22–32)
CO2: 24 mmol/L (ref 22–32)
Calcium: 8.1 mg/dL — ABNORMAL LOW (ref 8.9–10.3)
Calcium: 8.1 mg/dL — ABNORMAL LOW (ref 8.9–10.3)
Chloride: 93 mmol/L — ABNORMAL LOW (ref 98–111)
Chloride: 95 mmol/L — ABNORMAL LOW (ref 98–111)
Creatinine, Ser: 0.59 mg/dL — ABNORMAL LOW (ref 0.61–1.24)
Creatinine, Ser: 0.68 mg/dL (ref 0.61–1.24)
GFR, Estimated: >60 mL/min (ref >60)
GFR, Estimated: >60 mL/min (ref >60)
Glucose, Bld: 118 mg/dL — ABNORMAL HIGH (ref 70–99)
Glucose, Bld: 127 mg/dL — ABNORMAL HIGH (ref 70–99)
Potassium: 3.7 mmol/L (ref 3.5–5.1)
Potassium: 3.5 mmol/L (ref 3.5–5.1)
Sodium: 123 mmol/L — ABNORMAL LOW (ref 135–145)
Sodium: 126 mmol/L — ABNORMAL LOW (ref 135–145)

## 2021-11-07 LAB — CBC WITH DIFFERENTIAL/PLATELET
Abs Immature Granulocytes: 0.11 10*3/uL — ABNORMAL HIGH (ref 0.00–0.07)
Basophils Absolute: 0 10*3/uL (ref 0.0–0.1)
Basophils Relative: 1 %
Eosinophils Absolute: 0.1 10*3/uL (ref 0.0–0.5)
Eosinophils Relative: 2 %
HCT: 27.1 % — ABNORMAL LOW (ref 39.0–52.0)
Hemoglobin: 8.9 g/dL — ABNORMAL LOW (ref 13.0–17.0)
Immature Granulocytes: 5 %
Lymphocytes Relative: 5 %
Lymphs Abs: 0.1 10*3/uL — ABNORMAL LOW (ref 0.7–4.0)
MCH: 33.5 pg (ref 26.0–34.0)
MCHC: 32.8 g/dL (ref 30.0–36.0)
MCV: 101.9 fL — ABNORMAL HIGH (ref 80.0–100.0)
Monocytes Absolute: 0.5 10*3/uL (ref 0.1–1.0)
Monocytes Relative: 22 %
Neutro Abs: 1.4 10*3/uL — ABNORMAL LOW (ref 1.7–7.7)
Neutrophils Relative %: 65 %
Platelets: 66 10*3/uL — ABNORMAL LOW (ref 150–400)
RBC: 2.66 MIL/uL — ABNORMAL LOW (ref 4.22–5.81)
RDW: 14.6 % (ref 11.5–15.5)
WBC: 2.1 10*3/uL — ABNORMAL LOW (ref 4.0–10.5)
nRBC: 0 % (ref 0.0–0.2)

## 2021-11-07 LAB — MAGNESIUM: Magnesium: 1.8 mg/dL (ref 1.7–2.4)

## 2021-11-07 MED ORDER — MAGNESIUM SULFATE 2 GM/50ML IV SOLN
2.0000 g | Freq: Once | INTRAVENOUS | Status: AC
  Administered 2021-11-07: 2 g via INTRAVENOUS
  Filled 2021-11-07: qty 50

## 2021-11-07 MED ORDER — FUROSEMIDE 10 MG/ML IJ SOLN
40.0000 mg | Freq: Once | INTRAMUSCULAR | Status: AC
  Administered 2021-11-07: 40 mg via INTRAVENOUS
  Filled 2021-11-07: qty 4

## 2021-11-07 NOTE — Plan of Care (Signed)
Pt was very inquisitive during med pass, some what paranoid. Time taken to go over HS meds and what they are prescribe for. Pt expressed an understanding  Problem: Education: Goal: Understanding of medication regimen will improve Outcome: Progressing

## 2021-11-07 NOTE — Progress Notes (Signed)
Progress Note   Patient: Corey [name] NTI:[id number] DOB: [birth year] DOA: 11/03/2021     4 DOS: the patient was seen and examined on 11/07/2021   Brief hospital course: Corey [name] is a 76 y.o. male with PMH stage IV NSCLC with liver mets, PAF, CAD, chronic low back p ain, anemia, anxiety, osteoarthritis, HLD, HTN, GERD, OSA on CPAP, prostate cancer who presented with approximately 4 days of progressively worsening weakness and ongoing productive cough of yellow sputum with associated pleuritic chest pain, malaise, dyspnea.  He recently deceived chemotherapy within the past week as well.  Due to continued symptoms of feeling poorly, he presented for further evaluation. On evaluation in the ER, he underwent CXR which showed ongoing extensive irregular opacities involving the left lung not significantly changed from prior imaging.  Prior CT chest on 10/15/2021 showed a stable dense consolidation in the lingula and left lower lobe. Notable labs included WBC 2.8, platelet count 158.  Sodium 128.  BNP 187. There was concern for possible pneumonia and he was started on vancomycin and cefepime. He also developed A. fib with RVR and was started on a Cardizem drip.  Assessment and Plan: * Atrial fibrillation with RVR (Corey [name])- (present on admission) - on Tikosyn, Toprol, and Xarelto at home - Patient noted to be in A. fib with RVR on admission - s/p cardizem drip on admission but BP low/normal with prolonged QTc - cardiology consulted for further assistance given complexity of med regimen and prolonged QTc - given calculated QTc, cardiology transitioning to amio drip at this time and continuing cardizem  - plans are for oral amio tomorrow; started on oral lopressor as well - rate remaining relatively stable/controlled   CAP (community acquired pneumonia)- (present on admission) - ongoing LLL consolidation which may be acutely worse; symptomatically he feels worse over the past ~1 week - s/p vanc and  cefepime on admission; MRSA screen negative, okay to d/c vanc and continue monotherapy cefepime at this time - follow up cultures (blood cultures remain negative)  Prolonged Q-T interval on ECG - tikosyn now officially discontinued per cardiology - continue monitoring electrolytes; goal K>4, Mg>2  Hyponatremia- (present on admission) - appeared clinically volume depleted - unfortunately edema continues to worsen some since admission; given hypoalbuminemia he is likely 3rd spacing. FeNa calculated <<1% on 11/06/21 consistent with intravascular volume depletion and worsening hyponatremia - ideally needs intravascular repletion but will need to correct some of albumin first in efforts to maximize volume status; initially started NS on 2/3 but will stop this and give albumin first and assess response (hopefully this will be enough to shift volume back intravascularly without need for IVF) - trend BMP - urine output picked up after albumin started; mild drop in Na not much, but will give lasix dose this am and repeat BMP in afternoon to assess response and make further adjustments thereafter   Non-small cell carcinoma of left lung, stage 4 (Corey [name])- (present on admission) - follows outpatient with Dr. Earlie [name]  Stasis dermatitis - more prominent on LLE; no overt cellulitis signs as nontender as well - B/L LE duplex negative for DVT as well, performed 11/06/21 - likely caused by ongoing LE edema  Leukopenia- (present on admission) - likely multifactorial from underlying infection plus recent chemo   Macrocytic anemia- (present on admission) - no overt bleeding - downtrend suspected in setting of chemo recently   Sepsis (Corey [name])- (present on admission) - neutropenia, tachycardia, tachypnea, lung source suspected - see pna treatment  OSA on CPAP - continue nightly CPAP  GERD (gastroesophageal reflux disease)- (present on admission) - continue PPI  Dyslipidemia- (present on admission) -  Continue Lipitor  Benign essential HTN- (present on admission) - BP soft after cardizem drip started - continue monitoring   Coronary artery disease- (present on admission) - Continue Lipitor, BB -On Xarelto for underlying A. fib     Subjective:  No events overnight.  Voiding a little more yesterday after albumin was started.  Breathing and cough also appears somewhat improved today.  Still weak and deconditioned.  Physical Exam: Vitals:   11/07/21 1100 11/07/21 1106 11/07/21 1200 11/07/21 1300  BP: 106/66  126/68 108/61  Pulse: 92  89 86  Resp: 16  15 19   Temp:  (!) 97.5 F (36.4 C)    TempSrc:  Oral    SpO2: 99%  100% 100%  Physical Exam Constitutional:      General: He is not in acute distress.    Appearance: Normal appearance.     Comments: Lethargic appearing  HENT:     Head: Normocephalic and atraumatic.     Mouth/Throat:     Mouth: Mucous membranes are moist.  Eyes:     Extraocular Movements: Extraocular movements intact.  Cardiovascular:     Rate and Rhythm: Normal rate and regular rhythm.     Heart sounds: Normal heart sounds.  Pulmonary:     Effort: Pulmonary effort is normal.     Breath sounds: No wheezing.     Comments: Coarse breath sounds bilaterally but slowly improving Abdominal:     General: Bowel sounds are normal. There is no distension.     Palpations: Abdomen is soft.     Tenderness: There is no abdominal tenderness.  Musculoskeletal:        General: Normal range of motion.     Cervical back: Normal range of motion and neck supple.     Comments: 2+ B/L LE edema. Mild LLE erythema, no tenderness  Skin:    General: Skin is warm and dry.  Neurological:     General: No focal deficit present.     Mental Status: He is alert.  Psychiatric:        Mood and Affect: Mood normal.        Behavior: Behavior normal.    Data Reviewed:  I have Reviewed nursing notes, Vitals, and Lab results since pt's last encounter. Pertinent lab results WBC 2.1,  Hgb 8.9 g/dL Na 123 I have ordered test including BMP, CBC I have reviewed the last note from staff over past 24 hours,  I have discussed pt's care plan and test results with nursing staff, CM.   Family Communication:    Code Status: Full Code   Disposition: Status is: Inpatient Remains inpatient appropriate because: Ongoing pneumonia treatment and A. fib with RVR treatment   Planned Discharge Destination: Home  DVT ppx: Xarelto    Author: Dwyane Dee, MD 11/07/2021 4:32 PM  For on call review [email or web address].

## 2021-11-07 NOTE — Progress Notes (Signed)
Progress Note  Patient Name: Corey [name] Date of Encounter: 11/07/2021  Primary Cardiologist: Mertie Moores, MD  Subjective   Feels well this am In NSR   Inpatient Medications    Scheduled Meds:  acidophilus  1 capsule Oral Daily   arformoterol  15 mcg Nebulization BID   And   umeclidinium bromide  1 puff Inhalation Daily   atorvastatin  40 mg Oral QHS   budesonide  0.5 mg Nebulization BID   carbidopa-levodopa  1 tablet Oral 4 times per day   Carbinoxamine Maleate  6 mg Oral BID   Chlorhexidine Gluconate Cloth  6 each Topical Daily   fenofibrate  160 mg Oral Q1400   fluticasone  2 spray Each Nare Daily   folic acid  1 mg Oral Daily   furosemide  40 mg Intravenous Once   guaiFENesin  1,200 mg Oral BID   ipratropium  2 spray Each Nare q AM   lactulose  10 g Oral BID   levalbuterol  0.63 mg Nebulization BID   magnesium oxide  400 mg Oral BID   mouth rinse  15 mL Mouth Rinse BID   metoprolol tartrate  25 mg Oral BID   pantoprazole  40 mg Oral Daily   polyethylene glycol  17 g Oral BID   potassium chloride  20 mEq Oral Q1400   rivaroxaban  20 mg Oral QPM   senna-docusate  1 tablet Oral BID   sodium chloride flush  3 mL Intravenous Q12H   sorbitol  30 mL Oral Daily   tamsulosin  0.4 mg Oral QPC supper   topiramate  100 mg Oral BID   Continuous Infusions:  sodium chloride Stopped (11/06/21 1119)   albumin human 60 mL/hr at 11/07/21 0739   amiodarone 30 mg/hr (11/07/21 0739)   ceFEPime (MAXIPIME) IV Stopped (11/07/21 0641)   diltiazem (CARDIZEM) infusion 10 mg/hr (11/07/21 0739)   magnesium sulfate bolus IVPB     PRN Meds: sodium chloride, acetaminophen **OR** acetaminophen, albuterol, ALPRAZolam, alum & mag hydroxide-simeth, benzonatate, chlorpheniramine-HYDROcodone, lip balm, metoCLOPramide (REGLAN) injection, nitroGLYCERIN, sodium chloride flush   Vital Signs    Vitals:   11/07/21 0600 11/07/21 0721 11/07/21 0730 11/07/21 0739  BP: 130/68  111/61   Pulse: 89    87  Resp: 18 (!) 28 18 (!) 23  Temp:      TempSrc:      SpO2: 100%   96%    Intake/Output Summary (Last 24 hours) at 11/07/2021 6812 Last data filed at 11/07/2021 0739 Gross per 24 hour  Intake 1728.06 ml  Output 2625 ml  Net -896.94 ml   Last 3 Weights 10/28/2021 10/23/2021 10/20/2021  Weight (lbs) 228 lb 8 oz 229 lb 12.8 oz 229 lb 1.6 oz  Weight (kg) 103.647 kg 104.237 kg 103.919 kg     Telemetry    Atrial fib, brief paroxysms of atrial flutter - Personally Reviewed  Physical Exam   Affect appropriate Healthy:  appears stated age HEENT: normal Neck supple with no adenopathy JVP normal no bruits no thyromegaly Lungs clear with no wheezing and good diaphragmatic motion Heart:  S1/S2 no murmur, no rub, gallop or click PMI normal Abdomen: benighn, BS positve, no tenderness, no AAA no bruit.  No HSM or HJR Distal pulses intact with no bruits LLE erythema  Neuro non-focal Skin warm and dry No muscular weakness   Labs    High Sensitivity Troponin:  No results for input(s): TROPONINIHS in the last 720 hours.  Cardiac EnzymesNo results for input(s): TROPONINI in the last 168 hours. No results for input(s): TROPIPOC in the last 168 hours.   Chemistry Recent Labs  Lab 11/03/21 1208 11/04/21 0455 11/05/21 0445 11/06/21 0630 11/07/21 0237  NA 128* 128* 128* 124* 123*  K 4.0 3.6 4.0 4.1 3.7  CL 97* 98 98 96* 93*  CO2 22 23 20* 21* 23  GLUCOSE 98 86 106* 123* 118*  BUN 17 12 15 14 10   CREATININE 0.91 0.68 0.73 0.72 0.59*  CALCIUM 8.8* 8.4* 8.4* 8.4* 8.1*  PROT 6.7 5.8*  --   --   --   ALBUMIN 2.7* 2.3*  --   --   --   AST 25 21  --   --   --   ALT 18 7  --   --   --   ALKPHOS 45 40  --   --   --   BILITOT 0.8 1.2  --   --   --   GFRNONAA >60 >60 >60 >60 >60  ANIONGAP 9 7 10 7 7      Hematology Recent Labs  Lab 11/05/21 0445 11/06/21 0630 11/07/21 0237  WBC 2.1* 2.6* 2.1*  RBC 2.72* 2.64* 2.66*  HGB 9.5* 9.0* 8.9*  HCT 27.8* 27.0* 27.1*  MCV 102.2*  102.3* 101.9*  MCH 34.9* 34.1* 33.5  MCHC 34.2 33.3 32.8  RDW 14.6 14.7 14.6  PLT 120* 93* 66*    BNP Recent Labs  Lab 11/03/21 1208  BNP 187.8*     DDimer No results for input(s): DDIMER in the last 168 hours.   Radiology    VAS [place] LOWER EXTREMITY VENOUS (DVT)  Result Date: 11/06/2021  Lower Venous DVT Study Patient Name:  Corey [name]  Date of Exam:   11/06/2021 Medical Rec #: [id number]    Accession #:    [id number] Date of Birth: 10/12/1945   Patient Gender: M Patient Age:   76 years Exam Location:  [facility] Procedure:      VAS [place] LOWER EXTREMITY VENOUS (DVT) Referring Phys: Dwyane Dee --------------------------------------------------------------------------------  Indications: Swelling - LLE.  Risk Factors: CA patient on chemotherapy. Anticoagulation: Xarelto. Comparison Study: Previous exam on 11/03/2021 was negative for DVT Performing Technologist: Rogelia Rohrer RVT, RDMS  Examination Guidelines: A complete evaluation includes B-mode imaging, spectral Doppler, color Doppler, and power Doppler as needed of all accessible portions of each vessel. Bilateral testing is considered an integral part of a complete examination. Limited examinations for reoccurring indications may be performed as noted. The reflux portion of the exam is performed with the patient in reverse Trendelenburg.  +---------+---------------+---------+-----------+----------+--------------+  RIGHT     Compressibility Phasicity Spontaneity Properties Thrombus Aging  +---------+---------------+---------+-----------+----------+--------------+  CFV       Full            Yes       Yes                                    +---------+---------------+---------+-----------+----------+--------------+  SFJ       Full                                                             +---------+---------------+---------+-----------+----------+--------------+  FV Prox   Full            Yes       Yes                                     +---------+---------------+---------+-----------+----------+--------------+  FV Mid    Full            Yes       Yes                                    +---------+---------------+---------+-----------+----------+--------------+  FV Distal Full            Yes       Yes                                    +---------+---------------+---------+-----------+----------+--------------+  PFV       Full                                                             +---------+---------------+---------+-----------+----------+--------------+  POP       Full            Yes       Yes                                    +---------+---------------+---------+-----------+----------+--------------+  PTV       Full                                                             +---------+---------------+---------+-----------+----------+--------------+  PERO      Full                                                             +---------+---------------+---------+-----------+----------+--------------+   +---------+---------------+---------+-----------+----------+--------------+  LEFT      Compressibility Phasicity Spontaneity Properties Thrombus Aging  +---------+---------------+---------+-----------+----------+--------------+  CFV       Full            Yes       Yes                                    +---------+---------------+---------+-----------+----------+--------------+  SFJ       Full                                                             +---------+---------------+---------+-----------+----------+--------------+  FV Prox   Full            Yes       Yes                                    +---------+---------------+---------+-----------+----------+--------------+  FV Mid    Full            Yes       Yes                                    +---------+---------------+---------+-----------+----------+--------------+  FV Distal Full            Yes       Yes                                     +---------+---------------+---------+-----------+----------+--------------+  PFV       Full                                                             +---------+---------------+---------+-----------+----------+--------------+  POP       Full            Yes       Yes                                    +---------+---------------+---------+-----------+----------+--------------+  PTV       Full                                                             +---------+---------------+---------+-----------+----------+--------------+  PERO      Full                                                             +---------+---------------+---------+-----------+----------+--------------+    Summary: BILATERAL: - No evidence of deep vein thrombosis seen in the lower extremities, bilaterally. - No evidence of superficial venous thrombosis in the lower extremities, bilaterally. -No evidence of popliteal cyst, bilaterally.   *See table(s) above for measurements and observations.    Preliminary     Cardiac Studies   2D echo 08/2021  1. Left ventricular ejection fraction, by estimation, is 65 to 70%. The  left ventricle has normal function. The left ventricle has no regional  wall motion abnormalities. There is mild left ventricular hypertrophy.  Left ventricular diastolic parameters  are indeterminate.   2. Right ventricular systolic function mild to moderately reduced. The  right ventricular size is mildly enlarged. There is normal pulmonary  artery systolic pressure.   3. Trivial mitral valve regurgitation.   4. The aortic valve is tricuspid.  Aortic valve regurgitation is not  visualized. Mild aortic valve sclerosis is present, with no evidence of  aortic valve stenosis.   5. Aortic dilatation noted. There is mild dilatation of the ascending  aorta, measuring 40 mm.   6. The inferior vena cava is normal in size with greater than 50%  respiratory variability, suggesting right atrial pressure of 3 mmHg.    Patient Profile     76 y.o. male with CAD s/p remote PCIs of RCA 2004 and 2007 with otherwise mild nonbstructive CAD, stage 4 lung CA, CVA, paroxysmal atrial fibrillation on Tikosyn, ILR in place, QT prolongation, HTN, HLD, prostate CA, OSA on CPAP, mild carotid artery disease (3-76% RICA, 28-31% LICA by duplex 51/7616), prior anemia requiring transfusions (felt chemo related), mild dilation of ascending aorta   Assessment & Plan   1. Possible community acquired PNA occurring in the context of stage IV lung CA, also worsening hyponatremia and pancytopenia - on Maxipime hard to tell in LLL what's cancer and whats infection -significant liver mets  - Full Code      2. Rapid atrial fibrillation, here with QT prolongation - given that his recent AF burden was 0% by ILR, suspect driven by acute medical issues - IV amiodarone converted On xaretlto for anticoagulation  - change to PO amiodarone in am - magnesium oxide Mg level 1.8 now  -lopressor 25 mg bid  -normal LA size on echo   3. Coronary artery disease - no symptoms to suggest ACS this admission - not on ASA given Xarelto - continue BB - continue statin     4. Essential HTN - with intermittent hypotension, managed in context above   5. Mild-moderate carotid artery disease by duplex last in 2021, mild dilation of ascending aorta 03/2021 - follow clinically for now, plan conservative management given comorbidities  For questions or updates, please contact [place] Please consult [email or web address] for contact info under Cardiology/STEMI.  Signed, Jenkins Rouge, MD 11/07/2021, 8:22 AM

## 2021-11-08 ENCOUNTER — Other Ambulatory Visit: Payer: Self-pay

## 2021-11-08 DIAGNOSIS — J189 Pneumonia, unspecified organism: Secondary | ICD-10-CM | POA: Diagnosis not present

## 2021-11-08 DIAGNOSIS — I4891 Unspecified atrial fibrillation: Secondary | ICD-10-CM | POA: Diagnosis not present

## 2021-11-08 DIAGNOSIS — E871 Hypo-osmolality and hyponatremia: Secondary | ICD-10-CM | POA: Diagnosis not present

## 2021-11-08 LAB — BASIC METABOLIC PANEL
Anion gap: 8 (ref 5–15)
BUN: 9 mg/dL (ref 8–23)
CO2: 25 mmol/L (ref 22–32)
Calcium: 8.4 mg/dL — ABNORMAL LOW (ref 8.9–10.3)
Chloride: 95 mmol/L — ABNORMAL LOW (ref 98–111)
Creatinine, Ser: 0.76 mg/dL (ref 0.61–1.24)
GFR, Estimated: >60 mL/min (ref >60)
Glucose, Bld: 118 mg/dL — ABNORMAL HIGH (ref 70–99)
Potassium: 3.5 mmol/L (ref 3.5–5.1)
Sodium: 128 mmol/L — ABNORMAL LOW (ref 135–145)

## 2021-11-08 LAB — CBC WITH DIFFERENTIAL/PLATELET
Abs Immature Granulocytes: 0.15 10*3/uL — ABNORMAL HIGH (ref 0.00–0.07)
Basophils Absolute: 0 10*3/uL (ref 0.0–0.1)
Basophils Relative: 1 %
Eosinophils Absolute: 0.1 10*3/uL (ref 0.0–0.5)
Eosinophils Relative: 2 %
HCT: 23.3 % — ABNORMAL LOW (ref 39.0–52.0)
Hemoglobin: 7.6 g/dL — ABNORMAL LOW (ref 13.0–17.0)
Immature Granulocytes: 4 %
Lymphocytes Relative: 5 %
Lymphs Abs: 0.2 10*3/uL — ABNORMAL LOW (ref 0.7–4.0)
MCH: 33.8 pg (ref 26.0–34.0)
MCHC: 32.6 g/dL (ref 30.0–36.0)
MCV: 103.6 fL — ABNORMAL HIGH (ref 80.0–100.0)
Monocytes Absolute: 0.5 10*3/uL (ref 0.1–1.0)
Monocytes Relative: 15 %
Neutro Abs: 2.5 10*3/uL (ref 1.7–7.7)
Neutrophils Relative %: 73 %
Platelets: 58 10*3/uL — ABNORMAL LOW (ref 150–400)
RBC: 2.25 MIL/uL — ABNORMAL LOW (ref 4.22–5.81)
RDW: 14.6 % (ref 11.5–15.5)
WBC: 3.4 10*3/uL — ABNORMAL LOW (ref 4.0–10.5)
nRBC: 0.6 % — ABNORMAL HIGH (ref 0.0–0.2)

## 2021-11-08 LAB — CULTURE, BLOOD (ROUTINE X 2)
Culture: NO GROWTH
Culture: NO GROWTH
Special Requests: ADEQUATE

## 2021-11-08 LAB — MAGNESIUM: Magnesium: 2 mg/dL (ref 1.7–2.4)

## 2021-11-08 MED ORDER — AMIODARONE HCL 200 MG PO TABS
100.0000 mg | ORAL_TABLET | Freq: Two times a day (BID) | ORAL | Status: DC
  Administered 2021-11-08 (×2): 100 mg via ORAL
  Filled 2021-11-08 (×2): qty 1

## 2021-11-08 MED ORDER — AMIODARONE HCL IN DEXTROSE 360-4.14 MG/200ML-% IV SOLN
60.0000 mg/h | INTRAVENOUS | Status: DC
  Administered 2021-11-08 – 2021-11-09 (×2): 60 mg/h via INTRAVENOUS
  Filled 2021-11-08 (×2): qty 200

## 2021-11-08 MED ORDER — AMIODARONE HCL IN DEXTROSE 360-4.14 MG/200ML-% IV SOLN
30.0000 mg/h | INTRAVENOUS | Status: DC
  Administered 2021-11-09 – 2021-11-13 (×9): 30 mg/h via INTRAVENOUS
  Filled 2021-11-08 (×8): qty 200

## 2021-11-08 MED ORDER — FUROSEMIDE 10 MG/ML IJ SOLN
40.0000 mg | Freq: Once | INTRAMUSCULAR | Status: AC
  Administered 2021-11-08: 40 mg via INTRAVENOUS
  Filled 2021-11-08: qty 4

## 2021-11-08 MED ORDER — POTASSIUM CHLORIDE CRYS ER 20 MEQ PO TBCR
40.0000 meq | EXTENDED_RELEASE_TABLET | Freq: Once | ORAL | Status: AC
  Administered 2021-11-08: 40 meq via ORAL
  Filled 2021-11-08: qty 2

## 2021-11-08 NOTE — Progress Notes (Addendum)
Progress Note  Patient Name: Corey [name] Date of Encounter: 11/08/2021  Primary Cardiologist: Mertie Moores, MD  Subjective   Feels well this am In NSR no cardiac complaints   Inpatient Medications    Scheduled Meds:  acidophilus  1 capsule Oral Daily   arformoterol  15 mcg Nebulization BID   And   umeclidinium bromide  1 puff Inhalation Daily   atorvastatin  40 mg Oral QHS   budesonide  0.5 mg Nebulization BID   carbidopa-levodopa  1 tablet Oral 4 times per day   Carbinoxamine Maleate  6 mg Oral BID   Chlorhexidine Gluconate Cloth  6 each Topical Daily   fenofibrate  160 mg Oral Q1400   fluticasone  2 spray Each Nare Daily   folic acid  1 mg Oral Daily   guaiFENesin  1,200 mg Oral BID   ipratropium  2 spray Each Nare q AM   lactulose  10 g Oral BID   levalbuterol  0.63 mg Nebulization BID   magnesium oxide  400 mg Oral BID   mouth rinse  15 mL Mouth Rinse BID   metoprolol tartrate  25 mg Oral BID   pantoprazole  40 mg Oral Daily   polyethylene glycol  17 g Oral BID   rivaroxaban  20 mg Oral QPM   senna-docusate  1 tablet Oral BID   sodium chloride flush  3 mL Intravenous Q12H   sorbitol  30 mL Oral Daily   tamsulosin  0.4 mg Oral QPC supper   topiramate  100 mg Oral BID   Continuous Infusions:  sodium chloride Stopped (11/06/21 1119)   amiodarone 30 mg/hr (11/08/21 0852)   ceFEPime (MAXIPIME) IV Stopped (11/08/21 0750)   diltiazem (CARDIZEM) infusion Stopped (11/07/21 1142)   PRN Meds: sodium chloride, acetaminophen **OR** acetaminophen, albuterol, ALPRAZolam, alum & mag hydroxide-simeth, benzonatate, chlorpheniramine-HYDROcodone, lip balm, metoCLOPramide (REGLAN) injection, nitroGLYCERIN, sodium chloride flush   Vital Signs    Vitals:   11/08/21 0500 11/08/21 0600 11/08/21 0801 11/08/21 0836  BP: (!) 102/55 (!) 102/35  121/65  Pulse: 93 65  99  Resp: (!) 32 (!) 24    Temp:   98.7 F (37.1 C)   TempSrc:   Oral   SpO2: 96% 100%      Intake/Output  Summary (Last 24 hours) at 11/08/2021 0948 Last data filed at 11/08/2021 1749 Gross per 24 hour  Intake 463.82 ml  Output 3275 ml  Net -2811.18 ml   Last 3 Weights 10/28/2021 10/23/2021 10/20/2021  Weight (lbs) 228 lb 8 oz 229 lb 12.8 oz 229 lb 1.6 oz  Weight (kg) 103.647 kg 104.237 kg 103.919 kg     Telemetry  NSR 11/08/2021   Physical Exam   Affect appropriate Healthy:  appears stated age HEENT: normal Neck supple with no adenopathy JVP normal no bruits no thyromegaly Lungs clear with no wheezing and good diaphragmatic motion Heart:  S1/S2 no murmur, no rub, gallop or click PMI normal Abdomen: benighn, BS positve, no tenderness, no AAA no bruit.  No HSM or HJR Distal pulses intact with no bruits LLE erythema  Neuro non-focal Skin warm and dry No muscular weakness   Labs    High Sensitivity Troponin:  No results for input(s): TROPONINIHS in the last 720 hours.    Cardiac EnzymesNo results for input(s): TROPONINI in the last 168 hours. No results for input(s): TROPIPOC in the last 168 hours.   Chemistry Recent Labs  Lab 11/03/21 1208 11/04/21 0455 11/05/21  2423 11/07/21 0237 11/07/21 1609 11/08/21 0500  NA 128* 128*   < > 123* 126* 128*  K 4.0 3.6   < > 3.7 3.5 3.5  CL 97* 98   < > 93* 95* 95*  CO2 22 23   < > 23 24 25   GLUCOSE 98 86   < > 118* 127* 118*  BUN 17 12   < > 10 10 9   CREATININE 0.91 0.68   < > 0.59* 0.68 0.76  CALCIUM 8.8* 8.4*   < > 8.1* 8.1* 8.4*  PROT 6.7 5.8*  --   --   --   --   ALBUMIN 2.7* 2.3*  --   --   --   --   AST 25 21  --   --   --   --   ALT 18 7  --   --   --   --   ALKPHOS 45 40  --   --   --   --   BILITOT 0.8 1.2  --   --   --   --   GFRNONAA >60 >60   < > >60 >60 >60  ANIONGAP 9 7   < > 7 7 8    < > = values in this interval not displayed.     Hematology Recent Labs  Lab 11/06/21 0630 11/07/21 0237 11/08/21 0500  WBC 2.6* 2.1* 3.4*  RBC 2.64* 2.66* 2.25*  HGB 9.0* 8.9* 7.6*  HCT 27.0* 27.1* 23.3*  MCV 102.3* 101.9*  103.6*  MCH 34.1* 33.5 33.8  MCHC 33.3 32.8 32.6  RDW 14.7 14.6 14.6  PLT 93* 66* 58*    BNP Recent Labs  Lab 11/03/21 1208  BNP 187.8*     DDimer No results for input(s): DDIMER in the last 168 hours.   Radiology    VAS [place] LOWER EXTREMITY VENOUS (DVT)  Result Date: 11/07/2021  Lower Venous DVT Study Patient Name:  Corey [name]  Date of Exam:   11/06/2021 Medical Rec #: [id number]    Accession #:    [id number] Date of Birth: 06/13/1946   Patient Gender: M Patient Age:   18 years Exam Location:  [facility] Procedure:      VAS [place] LOWER EXTREMITY VENOUS (DVT) Referring Phys: Dwyane Dee --------------------------------------------------------------------------------  Indications: Swelling - LLE.  Risk Factors: CA patient on chemotherapy. Anticoagulation: Xarelto. Comparison Study: Previous exam on 11/03/2021 was negative for DVT Performing Technologist: Rogelia Rohrer RVT, RDMS  Examination Guidelines: A complete evaluation includes B-mode imaging, spectral Doppler, color Doppler, and power Doppler as needed of all accessible portions of each vessel. Bilateral testing is considered an integral part of a complete examination. Limited examinations for reoccurring indications may be performed as noted. The reflux portion of the exam is performed with the patient in reverse Trendelenburg.  +---------+---------------+---------+-----------+----------+--------------+  RIGHT     Compressibility Phasicity Spontaneity Properties Thrombus Aging  +---------+---------------+---------+-----------+----------+--------------+  CFV       Full            Yes       Yes                                    +---------+---------------+---------+-----------+----------+--------------+  SFJ       Full                                                             +---------+---------------+---------+-----------+----------+--------------+  FV Prox   Full            Yes       Yes                                     +---------+---------------+---------+-----------+----------+--------------+  FV Mid    Full            Yes       Yes                                    +---------+---------------+---------+-----------+----------+--------------+  FV Distal Full            Yes       Yes                                    +---------+---------------+---------+-----------+----------+--------------+  PFV       Full                                                             +---------+---------------+---------+-----------+----------+--------------+  POP       Full            Yes       Yes                                    +---------+---------------+---------+-----------+----------+--------------+  PTV       Full                                                             +---------+---------------+---------+-----------+----------+--------------+  PERO      Full                                                             +---------+---------------+---------+-----------+----------+--------------+   +---------+---------------+---------+-----------+----------+--------------+  LEFT      Compressibility Phasicity Spontaneity Properties Thrombus Aging  +---------+---------------+---------+-----------+----------+--------------+  CFV       Full            Yes       Yes                                    +---------+---------------+---------+-----------+----------+--------------+  SFJ       Full                                                             +---------+---------------+---------+-----------+----------+--------------+  FV Prox   Full            Yes       Yes                                    +---------+---------------+---------+-----------+----------+--------------+  FV Mid    Full            Yes       Yes                                    +---------+---------------+---------+-----------+----------+--------------+  FV Distal Full            Yes       Yes                                     +---------+---------------+---------+-----------+----------+--------------+  PFV       Full                                                             +---------+---------------+---------+-----------+----------+--------------+  POP       Full            Yes       Yes                                    +---------+---------------+---------+-----------+----------+--------------+  PTV       Full                                                             +---------+---------------+---------+-----------+----------+--------------+  PERO      Full                                                             +---------+---------------+---------+-----------+----------+--------------+     Summary: BILATERAL: - No evidence of deep vein thrombosis seen in the lower extremities, bilaterally. - No evidence of superficial venous thrombosis in the lower extremities, bilaterally. -No evidence of popliteal cyst, bilaterally.   *See table(s) above for measurements and observations. Electronically signed by Harold Barban MD on 11/07/2021 at 5:28:58 PM.    Final     Cardiac Studies   2D echo 08/2021  1. Left ventricular ejection fraction, by estimation, is 65 to 70%. The  left ventricle has normal function. The left ventricle has no regional  wall motion abnormalities. There is mild left ventricular hypertrophy.  Left ventricular diastolic parameters  are indeterminate.   2. Right ventricular systolic function mild to moderately reduced. The  right ventricular size is mildly enlarged. There is normal pulmonary  artery systolic pressure.   3.  Trivial mitral valve regurgitation.   4. The aortic valve is tricuspid. Aortic valve regurgitation is not  visualized. Mild aortic valve sclerosis is present, with no evidence of  aortic valve stenosis.   5. Aortic dilatation noted. There is mild dilatation of the ascending  aorta, measuring 40 mm.   6. The inferior vena cava is normal in size with greater than 50%  respiratory  variability, suggesting right atrial pressure of 3 mmHg.   Patient Profile     76 y.o. male with CAD s/p remote PCIs of RCA 2004 and 2007 with otherwise mild nonbstructive CAD, stage 4 lung CA, CVA, paroxysmal atrial fibrillation on Tikosyn, ILR in place, QT prolongation, HTN, HLD, prostate CA, OSA on CPAP, mild carotid artery disease (3-53% RICA, 29-92% LICA by duplex 42/6834), prior anemia requiring transfusions (felt chemo related), mild dilation of ascending aorta   Assessment & Plan   1. Possible community acquired PNA occurring in the context of stage IV lung CA, also worsening hyponatremia and pancytopenia - on Maxipime hard to tell in LLL what's cancer and whats infection -significant liver mets  - Full Code   - Indicates being due for more chemo in next 2 weeks     2. Rapid atrial fibrillation, here with QT prolongation - given that his recent AF burden was 0% by ILR, suspect driven by acute medical issues - change to PO amiodarone 100 bid for a week then 100 mg daily Follow QT given history corrected QT in afib 2/3 485 will check ECG today  -continue xarelto   3. Coronary artery disease - no symptoms to suggest ACS this admission - not on ASA given Xarelto - continue BB - continue statin     4. Essential HTN - with intermittent hypotension, managed in context above   5. Mild-moderate carotid artery disease by duplex last in 2021, mild dilation of ascending aorta 03/2021 - follow clinically for now, plan conservative management given comorbidities  For questions or updates, please contact [place] Please consult [email or web address] for contact info under Cardiology/STEMI.  Signed, Jenkins Rouge, MD 11/08/2021, 9:48 AM

## 2021-11-08 NOTE — Progress Notes (Signed)
RN called regarding patient's HR has gone up to 130s earlier  Multiple EKGs done- which on my interpretation appears to be atrial tach, probably MAT, narrow complex, regular rhythm, varying P waves. The patient had Aifb RVR and was on IV amiodarone-> switched to PO and metoprolol  Again got paged stating HR now sustaining in 150-160s, at this point decision was made to start him back on IV amiodarone gtt (without bolus). His QTC in this tachy rhythm is difficult to eval and prior to this rhythm - he had NSR with qtc probably 485: slightly prolonged but acceptable to start amiodarone IV.   - likely MAT vs Atach with 1:1 conduction  - reload with IV amiodarone (no bolus) - monitor QTC with EKG in am - escalate metoprolol dose in am - avoid QT prolonging drugs  Renae Fickle, MD Cardiology coverage.

## 2021-11-08 NOTE — Progress Notes (Signed)
Progress Note   Patient: Corey [name] RCV:[id number] DOB: 1946/01/18 DOA: 11/03/2021     5 DOS: the patient was seen and examined on 11/08/2021   Brief hospital course: Corey [name] is a 76 y.o. male with PMH stage IV NSCLC with liver mets, PAF, CAD, chronic low back p ain, anemia, anxiety, osteoarthritis, HLD, HTN, GERD, OSA on CPAP, prostate cancer who presented with approximately 4 days of progressively worsening weakness and ongoing productive cough of yellow sputum with associated pleuritic chest pain, malaise, dyspnea.  He recently deceived chemotherapy within the past week as well.  Due to continued symptoms of feeling poorly, he presented for further evaluation. On evaluation in the ER, he underwent CXR which showed ongoing extensive irregular opacities involving the left lung not significantly changed from prior imaging.  Prior CT chest on 10/15/2021 showed a stable dense consolidation in the lingula and left lower lobe. Notable labs included WBC 2.8, platelet count 158.  Sodium 128.  BNP 187. There was concern for possible pneumonia and he was started on vancomycin and cefepime. He also developed A. fib with RVR and was started on a Cardizem drip.  Assessment and Plan: * Atrial fibrillation with RVR ([place])- (present on admission) - on Tikosyn, Toprol, and Xarelto at home - Patient noted to be in A. fib with RVR on admission - s/p cardizem drip on admission but BP low/normal with prolonged QTc - cardiology consulted for further assistance given complexity of med regimen and prolonged QTc - started on oral amio per cardiology  - rate remaining relatively stable/controlled   CAP (community acquired pneumonia)- (present on admission) - ongoing LLL consolidation which may be acutely worse; symptomatically he feels worse over the past ~1 week - s/p vanc and cefepime on admission; MRSA screen negative, okay to d/c vanc and continue monotherapy cefepime at this time - follow up cultures (blood  cultures remain negative)  Prolonged Q-T interval on ECG - tikosyn now officially discontinued per cardiology - continue monitoring electrolytes; goal K>4, Mg>2  Hyponatremia- (present on admission) - appeared clinically volume depleted - unfortunately edema continues to worsen some since admission; given hypoalbuminemia he is likely 3rd spacing. FeNa calculated <<1% on 11/06/21 consistent with intravascular volume depletion and worsening hyponatremia - ideally needs intravascular repletion but will need to correct some of albumin first in efforts to maximize volume status; initially started NS on 2/3 but will stop this and give albumin first and assess response (hopefully this will be enough to shift volume back intravascularly without need for IVF) - trend BMP - responding well to albumin and lasix; continue today   Non-small cell carcinoma of left lung, stage 4 ([place])- (present on admission) - follows outpatient with Dr. 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; no overt cellulitis signs as nontender as well - B/L LE duplex negative for DVT as well, performed 11/06/21 - likely caused by ongoing LE edema  Leukopenia- (present on admission) - likely multifactorial from underlying infection plus recent chemo   Macrocytic anemia- (present on admission) - no overt bleeding - downtrend suspected in setting of chemo recently   OSA on CPAP - continue nightly CPAP  GERD (gastroesophageal reflux disease)- (present on admission) - continue PPI  Dyslipidemia- (present on admission) - Continue Lipitor  Benign essential HTN- (present on admission) - BP soft after cardizem drip started - continue monitoring   Coronary artery disease- (present on admission) - Continue Lipitor, BB -On Xarelto for underlying A. fib  Sepsis (  HCC)-resolved as of 11/08/2021, (present on admission) - neutropenia, tachycardia, tachypnea, lung source suspected - see pna treatment      Subjective:   No events overnight.  Urine output continues to remain appropriate to Lasix response.  Legs are also slightly softer today but still with significant edema. Cough and breathing continues to improve as well. Mostly just weak now.  Physical Exam: Vitals:   11/08/21 0900 11/08/21 0930 11/08/21 1100 11/08/21 1200  BP: (!) 132/44  (!) 123/44 117/63  Pulse: 95 (!) 106 93 98  Resp: (!) 26 (!) 24 17 (!) 22  Temp:      TempSrc:      SpO2: 100% 97% 100% 95%  Physical Exam Constitutional:      General: He is not in acute distress.    Appearance: Normal appearance.     Comments: Lethargic appearing  HENT:     Head: Normocephalic and atraumatic.     Mouth/Throat:     Mouth: Mucous membranes are moist.  Eyes:     Extraocular Movements: Extraocular movements intact.  Cardiovascular:     Rate and Rhythm: Normal rate and regular rhythm.     Heart sounds: Normal heart sounds.  Pulmonary:     Effort: Pulmonary effort is normal.     Breath sounds: No wheezing.     Comments: Coarse breath sounds bilaterally but slowly improving Abdominal:     General: Bowel sounds are normal. There is no distension.     Palpations: Abdomen is soft.     Tenderness: There is no abdominal tenderness.  Musculoskeletal:        General: Normal range of motion.     Cervical back: Normal range of motion and neck supple.     Comments: 2+ B/L LE edema. Mild LLE erythema, no tenderness  Skin:    General: Skin is warm and dry.  Neurological:     General: No focal deficit present.     Mental Status: He is alert.  Psychiatric:        Mood and Affect: Mood normal.        Behavior: Behavior normal.    Data Reviewed:  I have Reviewed nursing notes, Vitals, and Lab results since pt's last encounter. Pertinent lab results WBC 3.4, Hgb 7.6 g/dL Na 128 I have ordered test including BMP, CBC I have reviewed the last note from staff over past 24 hours,  I have discussed pt's care plan and test results with nursing staff,  CM.   Family Communication:    Code Status: Full Code   Disposition: Status is: Inpatient Remains inpatient appropriate because: Ongoing pneumonia treatment and A. fib with RVR treatment   Planned Discharge Destination: Home  DVT ppx: Xarelto    Author: Dwyane Dee, MD 11/08/2021 12:27 PM  For on call review [email or web address].

## 2021-11-09 ENCOUNTER — Inpatient Hospital Stay (HOSPITAL_COMMUNITY): Payer: Medicare PPO

## 2021-11-09 ENCOUNTER — Other Ambulatory Visit: Payer: Self-pay

## 2021-11-09 ENCOUNTER — Telehealth: Payer: Self-pay | Admitting: Pulmonary Disease

## 2021-11-09 DIAGNOSIS — I5033 Acute on chronic diastolic (congestive) heart failure: Secondary | ICD-10-CM | POA: Diagnosis not present

## 2021-11-09 DIAGNOSIS — C3492 Malignant neoplasm of unspecified part of left bronchus or lung: Secondary | ICD-10-CM | POA: Diagnosis not present

## 2021-11-09 DIAGNOSIS — J189 Pneumonia, unspecified organism: Secondary | ICD-10-CM | POA: Diagnosis not present

## 2021-11-09 DIAGNOSIS — A419 Sepsis, unspecified organism: Principal | ICD-10-CM

## 2021-11-09 DIAGNOSIS — I4891 Unspecified atrial fibrillation: Secondary | ICD-10-CM | POA: Diagnosis not present

## 2021-11-09 DIAGNOSIS — I2510 Atherosclerotic heart disease of native coronary artery without angina pectoris: Secondary | ICD-10-CM | POA: Diagnosis not present

## 2021-11-09 LAB — CBC WITH DIFFERENTIAL/PLATELET
Abs Immature Granulocytes: 0.43 10*3/uL — ABNORMAL HIGH (ref 0.00–0.07)
Basophils Absolute: 0 10*3/uL (ref 0.0–0.1)
Basophils Relative: 1 %
Eosinophils Absolute: 0.1 10*3/uL (ref 0.0–0.5)
Eosinophils Relative: 1 %
HCT: 25.3 % — ABNORMAL LOW (ref 39.0–52.0)
Hemoglobin: 8.2 g/dL — ABNORMAL LOW (ref 13.0–17.0)
Immature Granulocytes: 7 %
Lymphocytes Relative: 7 %
Lymphs Abs: 0.4 10*3/uL — ABNORMAL LOW (ref 0.7–4.0)
MCH: 33.6 pg (ref 26.0–34.0)
MCHC: 32.4 g/dL (ref 30.0–36.0)
MCV: 103.7 fL — ABNORMAL HIGH (ref 80.0–100.0)
Monocytes Absolute: 0.7 10*3/uL (ref 0.1–1.0)
Monocytes Relative: 12 %
Neutro Abs: 4.7 10*3/uL (ref 1.7–7.7)
Neutrophils Relative %: 72 %
Platelets: 65 10*3/uL — ABNORMAL LOW (ref 150–400)
RBC: 2.44 MIL/uL — ABNORMAL LOW (ref 4.22–5.81)
RDW: 14.6 % (ref 11.5–15.5)
WBC: 6.4 10*3/uL (ref 4.0–10.5)
nRBC: 0 % (ref 0.0–0.2)

## 2021-11-09 LAB — BASIC METABOLIC PANEL
Anion gap: 7 (ref 5–15)
BUN: 9 mg/dL (ref 8–23)
CO2: 26 mmol/L (ref 22–32)
Calcium: 8.4 mg/dL — ABNORMAL LOW (ref 8.9–10.3)
Chloride: 95 mmol/L — ABNORMAL LOW (ref 98–111)
Creatinine, Ser: 0.75 mg/dL (ref 0.61–1.24)
GFR, Estimated: >60 mL/min (ref >60)
Glucose, Bld: 119 mg/dL — ABNORMAL HIGH (ref 70–99)
Potassium: 3.7 mmol/L (ref 3.5–5.1)
Sodium: 128 mmol/L — ABNORMAL LOW (ref 135–145)

## 2021-11-09 LAB — MAGNESIUM: Magnesium: 1.7 mg/dL (ref 1.7–2.4)

## 2021-11-09 MED ORDER — METRONIDAZOLE 500 MG/100ML IV SOLN
500.0000 mg | Freq: Two times a day (BID) | INTRAVENOUS | Status: DC
  Administered 2021-11-09 – 2021-11-10 (×2): 500 mg via INTRAVENOUS
  Filled 2021-11-09 (×2): qty 100

## 2021-11-09 MED ORDER — DEXTROMETHORPHAN POLISTIREX ER 30 MG/5ML PO SUER
60.0000 mg | Freq: Two times a day (BID) | ORAL | Status: DC | PRN
  Administered 2021-11-09 – 2021-11-20 (×13): 60 mg via ORAL
  Filled 2021-11-09 (×21): qty 10

## 2021-11-09 MED ORDER — ALBUMIN HUMAN 25 % IV SOLN
12.5000 g | Freq: Once | INTRAVENOUS | Status: AC
  Administered 2021-11-09: 12.5 g via INTRAVENOUS
  Filled 2021-11-09: qty 50

## 2021-11-09 MED ORDER — SODIUM CHLORIDE 0.9 % IV SOLN: INTRAVENOUS | Status: DC

## 2021-11-09 MED ORDER — GUAIFENESIN 100 MG/5ML PO LIQD
10.0000 mL | Freq: Once | ORAL | Status: AC
  Administered 2021-11-09: 10 mL via ORAL
  Filled 2021-11-09: qty 10

## 2021-11-09 MED ORDER — ORAL CARE MOUTH RINSE
15.0000 mL | Freq: Two times a day (BID) | OROMUCOSAL | Status: DC
  Administered 2021-11-09 – 2021-11-20 (×23): 15 mL via OROMUCOSAL

## 2021-11-09 MED ORDER — ADULT MULTIVITAMIN W/MINERALS CH
1.0000 | ORAL_TABLET | Freq: Every day | ORAL | Status: DC
  Administered 2021-11-09 – 2021-11-20 (×12): 1 via ORAL
  Filled 2021-11-09 (×12): qty 1

## 2021-11-09 MED ORDER — FUROSEMIDE 10 MG/ML IJ SOLN
40.0000 mg | Freq: Once | INTRAMUSCULAR | Status: AC
  Administered 2021-11-09: 40 mg via INTRAVENOUS
  Filled 2021-11-09: qty 4

## 2021-11-09 MED ORDER — ENSURE ENLIVE PO LIQD
237.0000 mL | Freq: Three times a day (TID) | ORAL | Status: DC
  Administered 2021-11-09 – 2021-11-20 (×16): 237 mL via ORAL

## 2021-11-09 MED ORDER — CHLORHEXIDINE GLUCONATE 0.12 % MT SOLN: 15.0000 mL | Freq: Two times a day (BID) | OROMUCOSAL | Status: DC

## 2021-11-09 MED ORDER — ORAL CARE MOUTH RINSE: 15.0000 mL | Freq: Two times a day (BID) | OROMUCOSAL | Status: DC

## 2021-11-09 NOTE — Progress Notes (Addendum)
During Reassessment, patient has slurred speech with mild aphagia and mild dysarthria. An NIH Scale was done with help from the charge nurse. Stroke protocol was activated per standing orders. [facility] informed, NP Olena Heckle at bedside.

## 2021-11-09 NOTE — Progress Notes (Signed)
Progress Note   Patient: Corey [name] DOB: 04-01-1946 DOA: 11/03/2021     6 DOS: the patient was seen and examined on 11/09/2021   Brief hospital course: Corey [name] is a 76 y.o. male with PMH stage IV NSCLC with liver mets, PAF, CAD, chronic low back p ain, anemia, anxiety, osteoarthritis, HLD, HTN, GERD, OSA on CPAP, prostate cancer who presented with approximately 4 days of progressively worsening weakness and ongoing productive cough of yellow sputum with associated pleuritic chest pain, malaise, dyspnea.  He recently deceived chemotherapy within the past week as well.  Due to continued symptoms of feeling poorly, he presented for further evaluation. On evaluation in the ER, he underwent CXR which showed ongoing extensive irregular opacities involving the left lung not significantly changed from prior imaging.  Prior CT chest on 10/15/2021 showed a stable dense consolidation in the lingula and left lower lobe. Notable labs included WBC 2.8, platelet count 158.  Sodium 128.  BNP 187. There was concern for possible pneumonia and he was started on vancomycin and cefepime. He also developed A. fib with RVR and was started on a Cardizem drip.  Assessment and Plan: * Atrial fibrillation with RVR ([place])- (present on admission) - on Tikosyn, Toprol, and Xarelto at home - Patient noted to be in A. fib with RVR on admission - s/p cardizem drip on admission but BP low/normal with prolonged QTc - cardiology consulted for further assistance given complexity of med regimen and prolonged QTc - back into RVR overnight and amio drip restarted - may need consideration of DCCV but unfortunately does have multiple chronic issues notably cancer; planning to consult palliative care on 2/7 - follow up cardiology rec's and changes  CAP (community acquired pneumonia)- (present on admission) - ongoing LLL consolidation which may be acutely worse; symptomatically he feels worse over the past ~1 week -  s/p vanc and cefepime on admission; MRSA screen negative, okay to d/c vanc and continue monotherapy cefepime at this time - complete 8 day cefepime course and stop - follow up cultures (blood cultures remain negative)  Prolonged Q-T interval on ECG - tikosyn now officially discontinued per cardiology - continue monitoring electrolytes; goal K>4, Mg>2  Hyponatremia- (present on admission) - appeared clinically volume depleted - unfortunately edema continues to worsen some since admission; given hypoalbuminemia he is likely 3rd spacing. FeNa calculated <<1% on 11/06/21 consistent with intravascular volume depletion and worsening hyponatremia - ideally needs intravascular repletion but will need to correct some of albumin first in efforts to maximize volume status; initially started NS on 2/3 but will stop this and give albumin first and assess response (hopefully this will be enough to shift volume back intravascularly without need for IVF) - trend BMP - responded well to albumin and lasix; using lasix intermittently especially with decreased PO intake   Non-small cell carcinoma of left lung, stage 4 ([place])- (present on admission) - follows outpatient with Dr. [name] - given his difficulty with recovery currently, will start to bring on palliative care on 2/7 for some further discussions   Stasis dermatitis - more prominent on LLE; no overt cellulitis signs as nontender as well - B/L LE duplex negative for DVT as well, performed 11/06/21 - likely caused by ongoing LE edema - some improvement in LE edema with lasix as well; will try and apply TED hose soon too   Leukopenia- (present on admission) - likely multifactorial from underlying infection plus recent chemo   Macrocytic anemia- (present on  admission) - no overt bleeding - downtrend suspected in setting of chemo recently   OSA on CPAP - continue nightly CPAP  GERD (gastroesophageal reflux disease)- (present on admission) - continue  PPI  Dyslipidemia- (present on admission) - Continue Lipitor  Benign essential HTN- (present on admission) - BP soft after cardizem drip started - continue monitoring   Coronary artery disease- (present on admission) - Continue Lipitor, BB -On Xarelto for underlying A. fib  Sepsis (HCC)-resolved as of 11/08/2021, (present on admission) - neutropenia, tachycardia, tachypnea, lung source suspected - see pna treatment      Subjective:  Back into RVR overnight. Restarted back on amio drip. He seems a little depressed this morning overnight his setback overnight.   Physical Exam: Vitals:   11/09/21 1000 11/09/21 1100 11/09/21 1102 11/09/21 1215  BP: (!) 90/57 95/77 95/77    Pulse: 75 (!) 118 (!) 133   Resp: 20 (!) 25    Temp:    98.3 F (36.8 C)  TempSrc:    Oral  SpO2: 96% 98%    Physical Exam Constitutional:      General: He is not in acute distress.    Appearance: Normal appearance.     Comments: Lethargic appearing  HENT:     Head: Normocephalic and atraumatic.     Mouth/Throat:     Mouth: Mucous membranes are moist.  Eyes:     Extraocular Movements: Extraocular movements intact.  Cardiovascular:     Rate and Rhythm: Tachycardia present. Rhythm irregular.     Heart sounds: Normal heart sounds.  Pulmonary:     Effort: Pulmonary effort is normal.     Breath sounds: No wheezing.     Comments: Coarse breath sounds bilaterally but slowly improving Abdominal:     General: Bowel sounds are normal. There is no distension.     Palpations: Abdomen is soft.     Tenderness: There is no abdominal tenderness.  Musculoskeletal:        General: Normal range of motion.     Cervical back: Normal range of motion and neck supple.     Comments: 2+ B/L LE edema with some mild improvement. Mild LLE erythema, no tenderness  Skin:    General: Skin is warm and dry.  Neurological:     General: No focal deficit present.     Mental Status: He is alert.  Psychiatric:        Mood and  Affect: Mood normal.        Behavior: Behavior normal.    Data Reviewed:  I have Reviewed nursing notes, Vitals, and Lab results since pt's last encounter. Pertinent lab results WBC 6.4, Hgb 8.2 g/dL Na 128 I have ordered test including BMP, CBC I have reviewed the last note from staff over past 24 hours,  I have discussed pt's care plan and test results with nursing staff, CM.   Family Communication:    Code Status: Full Code   Disposition: Status is: Inpatient Remains inpatient appropriate because: Ongoing pneumonia treatment and A. fib with RVR treatment   Planned Discharge Destination: Home  DVT ppx: Xarelto    Author: Dwyane Dee, MD 11/09/2021 1:36 PM  For on call review [email or web address].

## 2021-11-09 NOTE — Progress Notes (Signed)
Initial Nutrition Assessment  DOCUMENTATION CODES:   Not applicable  INTERVENTION:  -Ensure Enlive TID, between meals. Each supplement provides 350 kcal and 20 grams of protein -MVI with minerals -Provided diet ed handout for pt and pt's family    NUTRITION DIAGNOSIS:   Inadequate oral intake related to cancer and cancer related treatments, decreased appetite (left femoral neck non-small cell stage IV carcinoma) as evidenced by per patient/family report.   GOAL:   Patient will meet greater than or equal to 90% of their needs   MONITOR:   PO intake, Supplement acceptance, Weight trends, Labs  REASON FOR ASSESSMENT:   Consult Assessment of nutrition requirement/status  ASSESSMENT:   Pt is a 76 y.o. 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   Discussed pt with RN prior to entering room.   Visited with pt at bedside. Pt's son present in room during visit. Per pt, pt reports a decreased appetite today. Per pt, pt is experiencing metallic taste, which is contributing to his decreased po intake. Per pt, no chewing or swallowing difficulties noted. Pt's son reported that pt ate a few bits of [place] today and green beans, only about 25% today. Per pt's son, pt has lost ~70# since he first started chemotherapy and has gained back about 15# from that weight loss. Pt's son reports that pt was eating well up until his last chemotherapy last week. Since 1/25, pt has not been eating and drinking well and son reports that pt was throwing up one of those days. Pt does not report any  N/V today. Pt's son reports that pt was eating really well prior to his last chemotherapy. Per pt and pt's son, pt has been drinking water and ginger-ale frequently since admission.   Per pt and pt's son, pt uses a walker and/or cane at baseline and lives at home with his family.   Discussed with pt and pt's son the importance of good nutrition and adequate PO intake for energy/strength during cancer treatments and for recovery from illness. Encouraged pt to continue drinking fluids consistently. Provided tips on how to manage changes in taste and provided handout from [employer], "Changes in Taste and Smell," to pt and pt's son. Pt agreeable to trying Ensure supplement (chocolate).   Reviewed weight hx. In February 2022, pt weighed 263# and per chart review, pt is currently 227.9#. Pt has experienced a 13% weight loss in the past one year.   Medications reviewed. Folic acid, fenofibrate, mag-ox, medline mouth rinse, protonix, senokot-s, reglan injection   Labs reviewed. Sodium: 128, Chloride: 95, Calcium: 8.4     NUTRITION - FOCUSED PHYSICAL EXAM:  Flowsheet Row Most Recent Value  Orbital Region Moderate depletion  Upper Arm Region No depletion  Thoracic and Lumbar Region No depletion  Buccal Region Mild depletion  Temple Region Mild depletion  Clavicle Bone Region No depletion  Clavicle and Acromion Bone Region No depletion  Scapular Bone Region No depletion  Dorsal Hand No depletion  Patellar Region No depletion  Anterior Thigh Region No depletion  Posterior Calf Region No depletion  Edema (RD Assessment) None  Hair Reviewed  Eyes Reviewed  Mouth Reviewed  Skin Reviewed  Nails Reviewed       Diet Order:   Diet Order             Diet Heart Room service appropriate? Yes; Fluid consistency: Thin  Diet effective now                   EDUCATION NEEDS:   Education needs have been addressed  Skin:  Skin Assessment: Skin Integrity  Issues: Skin Integrity Issues:: Other (Comment) Other: ecchymosis  Last BM:  2/5; large; type 6  Height:   Ht Readings from Last 1 Encounters:  10/23/21 6\' 1"  (1.854 m)    Weight:   Wt Readings from Last 1 Encounters:  10/28/21 103.6 kg    Ideal Body Weight:  83.6 kg  BMI:  30.15   Estimated Nutritional Needs:   Kcal:  2100 - 2300  Protein:  105 - 120 grams  Fluid:  > 2.1 L    Maryruth Hancock, Dietetic Intern 11/09/2021 4:45 PM

## 2021-11-09 NOTE — Telephone Encounter (Signed)
Called and spoke with patient's wife [name]. She stated that the patient has been in the hospital since 11/03/21 due to being in afib. They were able to get his heart back into rhythm for a few days but then he went into afib again. Per the wife, the cardiologist thinks he keeps going into afib due to having PNA.   She was wanting to know if BI could come to the hospital and see the patient. She's upset that he has not been seen by a pulmonologist the entire time he has been in the hospital.  I advised her that BI is off today and she could ask for a pulm consult while he was still admitted. She still requested to have the message sent to [place], can you please advise? Thanks.

## 2021-11-09 NOTE — Progress Notes (Addendum)
Progress Note  Patient Name: Corey [name] Date of Encounter: 11/09/2021  [facility] Cardiologist: Mertie Moores, MD   Subjective   Pt discouraged that he converted back to RVR  Inpatient Medications    Scheduled Meds:  acidophilus  1 capsule Oral Daily   amiodarone  100 mg Oral BID   arformoterol  15 mcg Nebulization BID   And   umeclidinium bromide  1 puff Inhalation Daily   atorvastatin  40 mg Oral QHS   budesonide  0.5 mg Nebulization BID   carbidopa-levodopa  1 tablet Oral 4 times per day   Carbinoxamine Maleate  6 mg Oral BID   Chlorhexidine Gluconate Cloth  6 each Topical Daily   fenofibrate  160 mg Oral Q1400   fluticasone  2 spray Each Nare Daily   folic acid  1 mg Oral Daily   guaiFENesin  1,200 mg Oral BID   ipratropium  2 spray Each Nare q AM   lactulose  10 g Oral BID   levalbuterol  0.63 mg Nebulization BID   magnesium oxide  400 mg Oral BID   mouth rinse  15 mL Mouth Rinse BID   metoprolol tartrate  25 mg Oral BID   pantoprazole  40 mg Oral Daily   polyethylene glycol  17 g Oral BID   rivaroxaban  20 mg Oral QPM   senna-docusate  1 tablet Oral BID   sodium chloride flush  3 mL Intravenous Q12H   sorbitol  30 mL Oral Daily   tamsulosin  0.4 mg Oral QPC supper   topiramate  100 mg Oral BID   Continuous Infusions:  sodium chloride Stopped (11/06/21 1119)   amiodarone 30 mg/hr (11/09/21 0718)   ceFEPime (MAXIPIME) IV Stopped (11/09/21 8127)   diltiazem (CARDIZEM) infusion Stopped (11/07/21 1142)   PRN Meds: sodium chloride, acetaminophen **OR** acetaminophen, albuterol, ALPRAZolam, alum & mag hydroxide-simeth, benzonatate, dextromethorphan, lip balm, metoCLOPramide (REGLAN) injection, nitroGLYCERIN, sodium chloride flush   Vital Signs    Vitals:   11/09/21 0700 11/09/21 0718 11/09/21 0800 11/09/21 0810  BP: 102/62  108/68   Pulse: (!) 124 (!) 128 (!) 125   Resp: 18 (!) 23 (!) 24   Temp:    99.1 F (37.3 C)  TempSrc:    Oral  SpO2: 96% 97%  97%     Intake/Output Summary (Last 24 hours) at 11/09/2021 1050 Last data filed at 11/09/2021 0810 Gross per 24 hour  Intake 768.57 ml  Output 2625 ml  Net -1856.43 ml   Last 3 Weights 10/28/2021 10/23/2021 10/20/2021  Weight (lbs) 228 lb 8 oz 229 lb 12.8 oz 229 lb 1.6 oz  Weight (kg) 103.647 kg 104.237 kg 103.919 kg      Telemetry    Converted to Afib RVR 1830 - questions course fib vs intermittent flutter - Personally Reviewed  ECG    Read as sinus tachycardia with HR 131 - p waves, question coarse fib vs flutter - Personally Reviewed  Physical Exam   GEN: No acute distress.   Neck: No JVD Cardiac: irregular rhythm tachycardic rate Respiratory: Clear to auscultation bilaterally. GI: Soft, nontender, non-distended  MS: 1+ pitting b LE edema; No deformity. Neuro:  Nonfocal  Psych: Normal affect   Labs    High Sensitivity Troponin:  No results for input(s): TROPONINIHS in the last 720 hours.   Chemistry Recent Labs  Lab 11/03/21 1208 11/04/21 0455 11/05/21 0445 11/07/21 0237 11/07/21 1609 11/08/21 0500 11/09/21 0445  NA 128*  128*   < > 123* 126* 128* 128*  K 4.0 3.6   < > 3.7 3.5 3.5 3.7  CL 97* 98   < > 93* 95* 95* 95*  CO2 22 23   < > 23 24 25 26   GLUCOSE 98 86   < > 118* 127* 118* 119*  BUN 17 12   < > 10 10 9 9   CREATININE 0.91 0.68   < > 0.59* 0.68 0.76 0.75  CALCIUM 8.8* 8.4*   < > 8.1* 8.1* 8.4* 8.4*  MG 1.8 1.9   < > 1.8  --  2.0 1.7  PROT 6.7 5.8*  --   --   --   --   --   ALBUMIN 2.7* 2.3*  --   --   --   --   --   AST 25 21  --   --   --   --   --   ALT 18 7  --   --   --   --   --   ALKPHOS 45 40  --   --   --   --   --   BILITOT 0.8 1.2  --   --   --   --   --   GFRNONAA >60 >60   < > >60 >60 >60 >60  ANIONGAP 9 7   < > 7 7 8 7    < > = values in this interval not displayed.    Lipids No results for input(s): CHOL, TRIG, HDL, LABVLDL, LDLCALC, CHOLHDL in the last 168 hours.  Hematology Recent Labs  Lab 11/07/21 0237 11/08/21 0500  11/09/21 0445  WBC 2.1* 3.4* 6.4  RBC 2.66* 2.25* 2.44*  HGB 8.9* 7.6* 8.2*  HCT 27.1* 23.3* 25.3*  MCV 101.9* 103.6* 103.7*  MCH 33.5 33.8 33.6  MCHC 32.8 32.6 32.4  RDW 14.6 14.6 14.6  PLT 66* 58* 65*   Thyroid No results for input(s): TSH, FREET4 in the last 168 hours.  BNP Recent Labs  Lab 11/03/21 1208  BNP 187.8*    DDimer No results for input(s): DDIMER in the last 168 hours.   Radiology    No results found.  Cardiac Studies   2D echo 08/2021  1. Left ventricular ejection fraction, by estimation, is 65 to 70%. The  left ventricle has normal function. The left ventricle has no regional  wall motion abnormalities. There is mild left ventricular hypertrophy.  Left ventricular diastolic parameters  are indeterminate.   2. Right ventricular systolic function mild to moderately reduced. The  right ventricular size is mildly enlarged. There is normal pulmonary  artery systolic pressure.   3. Trivial mitral valve regurgitation.   4. The aortic valve is tricuspid. Aortic valve regurgitation is not  visualized. Mild aortic valve sclerosis is present, with no evidence of  aortic valve stenosis.   5. Aortic dilatation noted. There is mild dilatation of the ascending  aorta, measuring 40 mm.   6. The inferior vena cava is normal in size with greater than 50%  respiratory variability, suggesting right atrial pressure of 3 mmHg.   Patient Profile     76 y.o. male with CAD s/p remote PCIs of RCA 2004 and 2007 with otherwise mild nonbstructive CAD, stage 4 lung CA, CVA, paroxysmal atrial fibrillation on Tikosyn, ILR in place, QT prolongation, HTN, HLD, prostate CA, OSA on CPAP, mild carotid artery disease (4-66% RICA, 59-93% LICA by duplex 57/0177), prior anemia requiring  transfusions (felt chemo related), mild dilation of ascending aorta.  Assessment & Plan    Atrial fibrillation with RVR vs ?atrial flutter QT prolongation - recent ILR with 0% Afib - suspect RVR now driven  by acute ilness - hyponatremia and prolonged QT on admission --> tikosyn stopped - has had intermittent Afib RVR --> started on 100 mg amiodarone BID with plans to reduce to 100 mg daily - went back into RVR last night with HR in the 150s --> amiodarone bolus + drip last night - continue 25 mg lopressor BID as pressure allows - earlier this admission, he converted to sinus rhythm on IV cardizem and IV lopressor - QTcB this morning 437 ms - Na 128, K 3.7, Mg 1.7, Hb 8.2, PLT 65 - has had poor PO intake - given comorbidities, question utility of DCCV if he remains in RVR   Chronic anticoagulation - has been maintained on xarelto - no bleeding issues   Possible CAP Lung cancer stage IV Hyponatremia and pancytopenia - is being treated for CAP, but noted difficult to discern cancer from PNA - significant liver mets - consider goals of care discussion with palliative   RV dysfunction Lower extremity edema - on prior echo, likely due to lung cancer - appears mildly volume up today - do not suspect recurrence of cellulitis - poor PO intake and albumin 2.3 - consider compression socks - may need targeted doses of IV lasix      For questions or updates, please contact [facility] Please consult [email or web address] for contact info under        Signed, Ledora Bottcher, PA  11/09/2021, 10:50 AM     Patient seen and examined and agree with Fabian Sharp, PA as detailed above.   In brief, the patient is a 76 y.o. male with CAD s/p remote PCIs of RCA 2004 and 2007 with otherwise mild nonbstructive CAD, stage 4 lung CA, CVA, paroxysmal atrial fibrillation on Tikosyn, ILR in place, QT prolongation, HTN, HLD, prostate CA, OSA on CPAP, mild carotid artery disease (7-82% RICA, 42-35% LICA by duplex 36/1443), prior anemia requiring transfusions (felt chemo related), mild dilation of ascending aorta who presented with weakness, cough and DOE found to have possible CAP and afib with RVR for which  cardiology has been consulted.   ILR prior to admission with 0% burden of Afib and therefore suspect current episode of Afib with RVR due to acute illness. Tikosyn stopped during admission due to prolonged Qt and hyponatremia. Was initially on amiodarone gtt, then transitioned to PO and then developed recurrent RVR prompting re-initiation of amiodarone bolus +gtt. Further rate control has been limited by hypotension.   Currently, HR remain 110-130s. Bps soft 80-90s. Denies palpitations. Continues to have productive cough.   GEN: No acute distress.   Neck: No JVD Cardiac: Tachycardic, regular, no murmurs Respiratory: Rhonchorous GI: Soft, nontender, non-distended  MS: 1+ LE edema Neuro:  Nonfocal  Psych: Normal affect    Plan: -Continue amiodarone gtt -Can re-bolus as needed if sustaining HR>130s -Rate control limited due to hypotension -Will re-dose lasix 40mg  IV once today -Continue xarelto -Can consider DCCV once recovered from acute illness  -Management of CAP, lung cancer IV, hyponatremia and pancytopenia per primary  Gwyndolyn Kaufman, MD

## 2021-11-09 NOTE — Plan of Care (Signed)
°  Problem: Education: Goal: Knowledge of disease or condition will improve Outcome: Progressing Goal: Understanding of medication regimen will improve Outcome: Progressing   Problem: Cardiac: Goal: Ability to achieve and maintain adequate cardiopulmonary perfusion will improve Outcome: Not Progressing

## 2021-11-10 ENCOUNTER — Inpatient Hospital Stay (HOSPITAL_COMMUNITY): Payer: Medicare PPO

## 2021-11-10 ENCOUNTER — Other Ambulatory Visit: Payer: Self-pay

## 2021-11-10 ENCOUNTER — Encounter (HOSPITAL_COMMUNITY): Payer: Self-pay | Admitting: Internal Medicine

## 2021-11-10 DIAGNOSIS — I5031 Acute diastolic (congestive) heart failure: Secondary | ICD-10-CM

## 2021-11-10 DIAGNOSIS — E871 Hypo-osmolality and hyponatremia: Secondary | ICD-10-CM | POA: Diagnosis not present

## 2021-11-10 DIAGNOSIS — J189 Pneumonia, unspecified organism: Secondary | ICD-10-CM | POA: Diagnosis not present

## 2021-11-10 DIAGNOSIS — C3492 Malignant neoplasm of unspecified part of left bronchus or lung: Secondary | ICD-10-CM | POA: Diagnosis not present

## 2021-11-10 DIAGNOSIS — R471 Dysarthria and anarthria: Secondary | ICD-10-CM

## 2021-11-10 DIAGNOSIS — I4891 Unspecified atrial fibrillation: Secondary | ICD-10-CM | POA: Diagnosis not present

## 2021-11-10 LAB — CBC WITH DIFFERENTIAL/PLATELET
Abs Immature Granulocytes: 0.55 10*3/uL — ABNORMAL HIGH (ref 0.00–0.07)
Basophils Absolute: 0 10*3/uL (ref 0.0–0.1)
Basophils Relative: 0 %
Eosinophils Absolute: 0.1 10*3/uL (ref 0.0–0.5)
Eosinophils Relative: 1 %
HCT: 24.4 % — ABNORMAL LOW (ref 39.0–52.0)
Hemoglobin: 8 g/dL — ABNORMAL LOW (ref 13.0–17.0)
Immature Granulocytes: 7 %
Lymphocytes Relative: 6 %
Lymphs Abs: 0.5 10*3/uL — ABNORMAL LOW (ref 0.7–4.0)
MCH: 33.6 pg (ref 26.0–34.0)
MCHC: 32.8 g/dL (ref 30.0–36.0)
MCV: 102.5 fL — ABNORMAL HIGH (ref 80.0–100.0)
Monocytes Absolute: 0.8 10*3/uL (ref 0.1–1.0)
Monocytes Relative: 11 %
Neutro Abs: 5.6 10*3/uL (ref 1.7–7.7)
Neutrophils Relative %: 75 %
Platelets: 71 10*3/uL — ABNORMAL LOW (ref 150–400)
RBC: 2.38 MIL/uL — ABNORMAL LOW (ref 4.22–5.81)
RDW: 14.8 % (ref 11.5–15.5)
WBC: 7.6 10*3/uL (ref 4.0–10.5)
nRBC: 0.3 % — ABNORMAL HIGH (ref 0.0–0.2)

## 2021-11-10 LAB — GLUCOSE, CAPILLARY: Glucose-Capillary: 105 mg/dL — ABNORMAL HIGH (ref 70–99)

## 2021-11-10 LAB — BASIC METABOLIC PANEL
Anion gap: 7 (ref 5–15)
BUN: 10 mg/dL (ref 8–23)
CO2: 28 mmol/L (ref 22–32)
Calcium: 8.5 mg/dL — ABNORMAL LOW (ref 8.9–10.3)
Chloride: 94 mmol/L — ABNORMAL LOW (ref 98–111)
Creatinine, Ser: 0.83 mg/dL (ref 0.61–1.24)
GFR, Estimated: >60 mL/min (ref >60)
Glucose, Bld: 111 mg/dL — ABNORMAL HIGH (ref 70–99)
Potassium: 3.5 mmol/L (ref 3.5–5.1)
Sodium: 129 mmol/L — ABNORMAL LOW (ref 135–145)

## 2021-11-10 LAB — COMPREHENSIVE METABOLIC PANEL
ALT: 5 U/L (ref 0–44)
AST: 24 U/L (ref 15–41)
Albumin: 2.7 g/dL — ABNORMAL LOW (ref 3.5–5.0)
Alkaline Phosphatase: 49 U/L (ref 38–126)
Anion gap: 9 (ref 5–15)
BUN: 10 mg/dL (ref 8–23)
CO2: 27 mmol/L (ref 22–32)
Calcium: 8.5 mg/dL — ABNORMAL LOW (ref 8.9–10.3)
Chloride: 93 mmol/L — ABNORMAL LOW (ref 98–111)
Creatinine, Ser: 0.78 mg/dL (ref 0.61–1.24)
GFR, Estimated: >60 mL/min (ref >60)
Glucose, Bld: 113 mg/dL — ABNORMAL HIGH (ref 70–99)
Potassium: 3.4 mmol/L — ABNORMAL LOW (ref 3.5–5.1)
Sodium: 129 mmol/L — ABNORMAL LOW (ref 135–145)
Total Bilirubin: 0.7 mg/dL (ref 0.3–1.2)
Total Protein: 6 g/dL — ABNORMAL LOW (ref 6.5–8.1)

## 2021-11-10 LAB — CBC
HCT: 25.1 % — ABNORMAL LOW (ref 39.0–52.0)
Hemoglobin: 8.4 g/dL — ABNORMAL LOW (ref 13.0–17.0)
MCH: 34.1 pg — ABNORMAL HIGH (ref 26.0–34.0)
MCHC: 33.5 g/dL (ref 30.0–36.0)
MCV: 102 fL — ABNORMAL HIGH (ref 80.0–100.0)
Platelets: 72 10*3/uL — ABNORMAL LOW (ref 150–400)
RBC: 2.46 MIL/uL — ABNORMAL LOW (ref 4.22–5.81)
RDW: 14.8 % (ref 11.5–15.5)
WBC: 7.4 10*3/uL (ref 4.0–10.5)
nRBC: 0.3 % — ABNORMAL HIGH (ref 0.0–0.2)

## 2021-11-10 LAB — LACTATE DEHYDROGENASE: LDH: 186 U/L (ref 98–192)

## 2021-11-10 LAB — PROTIME-INR
INR: 2.6 — ABNORMAL HIGH (ref 0.8–1.2)
Prothrombin Time: 27.6 seconds — ABNORMAL HIGH (ref 11.4–15.2)

## 2021-11-10 LAB — MAGNESIUM: Magnesium: 1.6 mg/dL — ABNORMAL LOW (ref 1.7–2.4)

## 2021-11-10 LAB — C-REACTIVE PROTEIN: CRP: 14.7 mg/dL — ABNORMAL HIGH (ref <1.0)

## 2021-11-10 LAB — PROCALCITONIN: Procalcitonin: 0.27 ng/mL

## 2021-11-10 MED ORDER — SODIUM CHLORIDE 0.9 % IV SOLN: INTRAVENOUS | Status: DC | PRN

## 2021-11-10 MED ORDER — DIGOXIN 0.25 MG/ML IJ SOLN
0.5000 mg | Freq: Once | INTRAMUSCULAR | Status: AC
  Administered 2021-11-10: 0.5 mg via INTRAVENOUS
  Filled 2021-11-10: qty 2

## 2021-11-10 MED ORDER — LORAZEPAM 2 MG/ML IJ SOLN
1.0000 mg | Freq: Once | INTRAMUSCULAR | Status: AC | PRN
  Administered 2021-11-10: 1 mg via INTRAVENOUS
  Filled 2021-11-10: qty 1

## 2021-11-10 MED ORDER — LEVALBUTEROL HCL 0.63 MG/3ML IN NEBU: 0.6300 mg | INHALATION_SOLUTION | RESPIRATORY_TRACT | Status: DC | PRN

## 2021-11-10 MED ORDER — IOHEXOL 350 MG/ML SOLN
100.0000 mL | Freq: Once | INTRAVENOUS | Status: AC | PRN
  Administered 2021-11-10: 100 mL via INTRAVENOUS

## 2021-11-10 MED ORDER — DIGOXIN 0.25 MG/ML IJ SOLN
INTRAMUSCULAR | Status: AC
  Administered 2021-11-10: 0.25 mg via INTRAVENOUS
  Filled 2021-11-10: qty 2

## 2021-11-10 MED ORDER — REVEFENACIN 175 MCG/3ML IN SOLN
175.0000 ug | Freq: Every day | RESPIRATORY_TRACT | Status: DC
  Administered 2021-11-10 – 2021-11-20 (×9): 175 ug via RESPIRATORY_TRACT
  Filled 2021-11-10 (×11): qty 3

## 2021-11-10 MED ORDER — METOPROLOL TARTRATE 5 MG/5ML IV SOLN
2.5000 mg | Freq: Once | INTRAVENOUS | Status: AC
Start: 2021-11-10 — End: 2021-11-10
  Administered 2021-11-10: 2.5 mg via INTRAVENOUS
  Filled 2021-11-10: qty 5

## 2021-11-10 MED ORDER — SODIUM CHLORIDE (PF) 0.9 % IJ SOLN
INTRAMUSCULAR | Status: AC
  Filled 2021-11-10: qty 50

## 2021-11-10 MED ORDER — DIGOXIN 0.25 MG/ML IJ SOLN
0.2500 mg | Freq: Once | INTRAMUSCULAR | Status: AC
  Filled 2021-11-10: qty 2

## 2021-11-10 MED ORDER — METRONIDAZOLE 500 MG/100ML IV SOLN
500.0000 mg | Freq: Two times a day (BID) | INTRAVENOUS | Status: AC
  Administered 2021-11-10 – 2021-11-13 (×7): 500 mg via INTRAVENOUS
  Filled 2021-11-10 (×7): qty 100

## 2021-11-10 MED ORDER — MAGNESIUM SULFATE 2 GM/50ML IV SOLN
2.0000 g | Freq: Once | INTRAVENOUS | Status: AC
  Administered 2021-11-10: 2 g via INTRAVENOUS
  Filled 2021-11-10: qty 50

## 2021-11-10 MED ORDER — DIGOXIN 0.25 MG/ML IJ SOLN
0.2500 mg | Freq: Once | INTRAMUSCULAR | Status: AC
  Administered 2021-11-10: 0.25 mg via INTRAVENOUS
  Filled 2021-11-10: qty 2

## 2021-11-10 MED ORDER — GADOBUTROL 1 MMOL/ML IV SOLN
10.0000 mL | Freq: Once | INTRAVENOUS | Status: AC | PRN
  Administered 2021-11-10: 10 mL via INTRAVENOUS

## 2021-11-10 MED ORDER — ASPIRIN 325 MG PO TABS
325.0000 mg | ORAL_TABLET | Freq: Once | ORAL | Status: AC
  Administered 2021-11-10: 325 mg via ORAL
  Filled 2021-11-10: qty 1

## 2021-11-10 MED ORDER — PREDNISONE 20 MG PO TABS
40.0000 mg | ORAL_TABLET | Freq: Every day | ORAL | Status: DC
  Administered 2021-11-10 – 2021-11-12 (×3): 40 mg via ORAL
  Filled 2021-11-10 (×3): qty 2

## 2021-11-10 NOTE — Progress Notes (Signed)
Patient returned safely post CT Scan. VSS, denies any pain.

## 2021-11-10 NOTE — Progress Notes (Signed)
Oncology Discharge Planning Admission Note  [facility] at [facility] Address: [address], [place], [place] [address] Hours of Operation:  8am - 5pm, Monday - Friday  Clinic Contact Information:  [phone]  Oncology Care Team: Medical Oncologist:  Dr. Jacqualyn Posey NP is aware of this hospital admission dated 11/03/21 and has assessed patient at the bedside.  The cancer center will follow Corey [name] inpatient care to assist with discharge planning as indicated by the oncologist.  We will arrange for follow up care closer to discharge.  Disclaimer:  This [place] note does not imply a formal consult request has been made by the admitting attending for this admission or there will be an inpatient consult completed by oncology.  Please request oncology consults as per standard process as indicated.

## 2021-11-10 NOTE — Progress Notes (Signed)
Patients wife Steele Berg called with an update. She plans on being here today at 10:30.

## 2021-11-10 NOTE — Consult Note (Signed)
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 His hospital course has been complicated by sepsis/PNA, thrombocytopenia, hyponatremia (Na 128), Afib with RVR requiring Cardiezem IV. A stroke alert was called for acute speech difficulties noted this evening. LNK 22:00 per patients nurse.  On exam patient has mild aphasia, left NLF, LUE drift, right sided sensory neglect/extinction and b/l LE weakness L>R. NIHSS of 11. Unclear mRS at this time. CTH reviewed, NAF noted. Chronic left MCA stroke is noted. Given current NOAC usage and Plts <100k, INR 2.6, thrombolytic therapy is not recommended. CTP, CTA head and neck are pending to evaluate for LVO.  Addendum: CTA head and neck negative for LVO. 70-80$ left ICA stenosis noted. Possible PE, CTA chest recommended. Emergent NIR not indicated. Rec vascular consult for consideration of future CEA. Recs d/w primary provider.   Our recommendations are outlined below.  Recommendations:        Stroke/Telemetry Floor       Neuro Checks       Bedside Swallow Eval       DVT Prophylaxis       IV Fluids, Normal Saline       Head of Bed 30 Degrees       Euglycemia and Avoid Hyperthermia (PRN Acetaminophen)       Antihypertensives PRN if Blood pressure is greater than 220/120 or there is a concern for End organ damage/contraindications for permissive HTN. If blood pressure is greater than 220/120 give labetalol PO or IV or Vasotec IV with a goal of 15%  reduction in BP during the first 24 hours.       ASA 325mg  x 1, followed by 81mg  daily       Given concern for acute stroke consider holding anticoagulation pending review of MRI brain, rec MRI brain w/wo in addition to stroke protocol and orderset.       Would have lower threshold to r/o IBE given sepsis, in the setting of chronic immunosuppression  Routine Consultation with [facility] for Follow up Care  Sign Out:       Discussed with Rapid Response Team    ------------------------------------------------------------------------------  Advanced Imaging: CTA Head and Neck Ordered:  CTP Ordered:   Metrics: Last Known Well: 11/09/2021 22:00:00 [facility] Notification Time: 11/10/2021 00:35:40 Stamp Time: 11/10/2021 00:35:40 Initial Response Time: 11/10/2021 00:42:00 Symptoms: speech changes. NIHSS Start Assessment Time: 11/10/2021 01:22:10 Patient is not a candidate for Thrombolytic. Thrombolytic Medical Decision: 11/10/2021 01:00:00 Patient was not deemed candidate for Thrombolytic because of following reasons: Use of NOAs within 48 hours. Thrombocytopenia.  CT head showed no acute hemorrhage or acute core infarct. I personally Reviewed the [place] physician Notified of Diagnostic Impression and Management Plan: 11/10/2021 01:23:19 Able to Reach 11/10/2021 01:23:19    ------------------------------------------------------------------------------  History of Present Illness: Patient is a 76 year old Male.  Inpatient stroke alert was called for symptoms of speech changes. 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  His hospital course has been complicated by sepsis/PNA, thrombocytopenia, hyponatremia (Na 128), Afib with RVR requiring Cardiezem IV. A stroke alert was called for acute speech difficulties noted this evening. LNK 22:00 per patients nurse. Patient is having difficulty completing sentences.    Past Medical History:      Hypertension      Hyperlipidemia      Atrial Fibrillation      Coronary Artery Disease      Stroke  Medications:  Anticoagulant use:  Yes Xarelto No Antiplatelet use Reviewed EMR for current medications  Allergies:  Reviewed  Social History: Smoking: Former Drug Use: No  Family History:  There Is Family History WC:HENIDPOE in EMR There is no family history of premature cerebrovascular disease pertinent to this consultation  ROS : 14 Points Review of Systems was performed and was negative except mentioned in HPI.  Past Surgical History: There Is No Surgical History Contributory To Todays Visit There Is Surgical History of:  Reviewed in EMR     Examination: BP(110//78), Pulse(88), 1A: Level of Consciousness - Alert; keenly responsive + 0 1B: Ask Month and Age - Both Questions Right + 0 1C: Blink Eyes & Squeeze Hands - Performs Both Tasks + 0 2: Test Horizontal Extraocular Movements - Normal + 0 3: Test Visual Fields - No Visual Loss + 0 4: Test Facial Palsy (Use Grimace if Obtunded) - Minor paralysis (flat nasolabial fold, smile asymmetry) + 1 5A: Test Left Arm Motor Drift - Drift, but doesn't hit bed + 1 5B: Test Right Arm Motor Drift - No Drift for 10 Seconds + 0 6A: Test Left Leg Motor Drift - Some Effort Against Gravity + 2 6B: Test Right Leg Motor Drift - Drift, hits bed + 2 7: Test Limb Ataxia (FNF/Heel-Shin) - No Ataxia + 0 8: Test Sensation - Complete Loss: Cannot Sense Being Touched At All + 2 9: Test Language/Aphasia - Mild-Moderate Aphasia: Some Obvious Changes, Without Significant Limitation + 1 10: Test Dysarthria - Mild-Moderate  Dysarthria: Slurring but can be understood + 1 11: Test Extinction/Inattention - Extinction to bilateral simultaneous stimulation + 1  NIHSS Score: 11   Pre-Morbid Modified Rankin Scale: Unable to assess   Patient/Family was informed the Neurology Consult would occur via TeleHealth consult by way of interactive audio and video telecommunications and consented to receiving care in this manner.   Patient is being evaluated for possible acute neurologic impairment and high probability of imminent or life-threatening deterioration. I spent total of 35 minutes providing care to this patient, including time for face to face visit via telemedicine, review of medical records, imaging studies and discussion of findings with providers, the patient and/or family.   Dr Ruffin Frederick   [facility] [phone]   Case [id number]

## 2021-11-10 NOTE — Assessment & Plan Note (Addendum)
-   acutely noted on the evening of 2/6 with concern for a stroke. 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% left ICA stenosis  - no obvious stroke on [facility]; does have chronic left parietal lobe infarct noted -Follow-up MRI brain noted to be unremarkable

## 2021-11-10 NOTE — Progress Notes (Addendum)
Progress Note   Patient: Corey [name]:[id number] DOB: [birth year] DOA: 11/03/2021     7 DOS: the patient was seen and examined on 11/10/2021   Update: CT reads completed as of 0415 Tele-neurology states to Hold Xarelto for now (0300)  Aspirin once at 325mg  PO MRI - head w/wo Routine  0430 hrs- Spoke with CCM- no further recommendations at this time.CCM is consulted for this AM already and will follow later this AM.     Overnight 2/6-11/10/2021:  Last known well time 2200. RN was called to the room by the patient /2330 hours. It was reported that patient had expressive aphasia. Code stroke was activated, patient was taken to CT, tele-neuro evaluated patient, ordered labs and CTA for patient. NIH completed prior to CT and with Tele-Neurologist.   Tele-neuro completed on about 0115 hrs. On 11/10/21 and patient is in route to the CT for imaging (CTA).  Tele -Neurology followed up +/- 0330 hrs Patient is awake and conversational at this time. TeleNeuro/Neurologist/Imaging results pending.    Brief hospital course: Corey [name] is a 76 year old male with PMH stage IV SCLC with liver mets, PAF, CAD, chronic lower back pain, anemia, anxiety, osteoarthritis, HLD, HTN, GERD, OSA on CPAP, prostate cancer. Underwent chest x-ray in the ER which showed ongoing extensive irregular opacities involving the left lung.  ------------------------------------------------------- Attending prior plan :  Assessment and Plan: * Atrial fibrillation with RVR ([place])- (present on admission) - on Tikosyn, Toprol, and Xarelto at home - Patient noted to be in A. fib with RVR on admission - s/p cardizem drip on admission but BP low/normal with prolonged QTc - cardiology consulted for further assistance given complexity of med regimen and prolonged QTc - back into RVR overnight and amio drip restarted - may need consideration of DCCV but unfortunately does have multiple chronic issues notably cancer; planning to  consult palliative care on 2/7 - follow up cardiology rec's and changes  Stasis dermatitis - more prominent on LLE; no overt cellulitis signs as nontender as well - B/L LE duplex negative for DVT as well, performed 11/06/21 - likely caused by ongoing LE edema - some improvement in LE edema with lasix as well; will try and apply TED hose soon too   Prolonged Q-T interval on ECG - tikosyn now officially discontinued per cardiology - continue monitoring electrolytes; goal K>4, Mg>2  Macrocytic anemia- (present on admission) - no overt bleeding - downtrend suspected in setting of chemo recently   Leukopenia- (present on admission) - likely multifactorial from underlying infection plus recent chemo   CAP (community acquired pneumonia)- (present on admission) - ongoing LLL consolidation which may be acutely worse; symptomatically he feels worse over the past ~1 week - s/p vanc and cefepime on admission; MRSA screen negative, okay to d/c vanc and continue monotherapy cefepime at this time - complete 8 day cefepime course and stop - follow up cultures (blood cultures remain negative)  Non-small cell carcinoma of left lung, stage 4 ([place])- (present on admission) - follows outpatient with Dr. [name] - given his difficulty with recovery currently, will start to bring on palliative care on 2/7 for some further discussions   Hyponatremia- (present on admission) - appeared clinically volume depleted - unfortunately edema continues to worsen some since admission; given hypoalbuminemia he is likely 3rd spacing. FeNa calculated <<1% on 11/06/21 consistent with intravascular volume depletion and worsening hyponatremia - ideally needs intravascular repletion but will need to correct some of albumin first in efforts to maximize  volume status; initially started NS on 2/3 but will stop this and give albumin first and assess response (hopefully this will be enough to shift volume back intravascularly without  need for IVF) - trend BMP - responded well to albumin and lasix; using lasix intermittently especially with decreased PO intake   OSA on CPAP - continue nightly CPAP  GERD (gastroesophageal reflux disease)- (present on admission) - continue PPI  Dyslipidemia- (present on admission) - Continue Lipitor  Benign essential HTN- (present on admission) - BP soft after cardizem drip started - continue monitoring   Coronary artery disease- (present on admission) - Continue Lipitor, BB -On Xarelto for underlying A. fib  Sepsis (HCC)-resolved as of 11/08/2021, (present on admission) - neutropenia, tachycardia, tachypnea, lung source suspected - see pna treatment     Subjective: As of 0115 hrs on 11/10/2021 patient is awake and oriented x4. During tele-neuro exam Neurologist find weakness to the left upper extremity (please see neuro report for further) He appears to be at his described baseline based on prior description with possibly intermittent Left arm weakness. He states that he has tremors of extremities at baseline which are present.  0530 hrs - Patient awakes readily and is conversational after waking. MOE x 4 present. Speech appears to remain approximately baseline as reported.   Physical Exam: Vitals:   11/09/21 2300 11/10/21 0000 11/10/21 0040 11/10/21 0100  BP: 100/75 110/78 120/81 (!) 134/117  Pulse: (!) 137 (!) 145 85 (!) 123  Resp: (!) 25 14 12  (!) 23  Temp:      TempSrc:      SpO2: 96% 100% 90% 100%     Disposition:                   Inpatient  Time spent: 45 minutes  Author: Kathryne Eriksson, NP 11/10/2021 1:15 AM

## 2021-11-10 NOTE — Plan of Care (Signed)
°  Problem: Education: Goal: Knowledge of disease or condition will improve Outcome: Progressing   Problem: Education: Goal: Knowledge of General Education information will improve Description: Including pain rating scale, medication(s)/side effects and non-pharmacologic comfort measures Outcome: Progressing   Problem: Pain Managment: Goal: General experience of comfort will improve Outcome: Progressing   Problem: Safety: Goal: Ability to remain free from injury will improve Outcome: Progressing

## 2021-11-10 NOTE — Progress Notes (Signed)
Progress Note  Patient Name: Corey [name] Date of Encounter: 11/11/2021  [facility] Cardiologist: Mertie Moores, MD   Subjective   Feeling better this morning. Breathing slowly improving. No chest pain.   HR improved this AM to 80-100s in Afib Blood pressures stable in 100-130s Remains on dilt gtt, amiodarone gtt, and digoxin  Inpatient Medications    Scheduled Meds:  acidophilus  1 capsule Oral Daily   arformoterol  15 mcg Nebulization BID   atorvastatin  40 mg Oral QHS   budesonide  0.5 mg Nebulization BID   carbidopa-levodopa  1 tablet Oral 4 times per day   Carbinoxamine Maleate  6 mg Oral BID   Chlorhexidine Gluconate Cloth  6 each Topical Daily   feeding supplement  237 mL Oral TID BM   fenofibrate  160 mg Oral Q1400   fluticasone  2 spray Each Nare Daily   folic acid  1 mg Oral Daily   guaiFENesin  1,200 mg Oral BID   ipratropium  2 spray Each Nare q AM   lactulose  10 g Oral BID   levalbuterol  0.63 mg Nebulization BID   mouth rinse  15 mL Mouth Rinse BID   metoprolol tartrate  25 mg Oral BID   multivitamin with minerals  1 tablet Oral Daily   pantoprazole  40 mg Oral Daily   polyethylene glycol  17 g Oral BID   predniSONE  40 mg Oral Q breakfast   revefenacin  175 mcg Nebulization Daily   senna-docusate  1 tablet Oral BID   sodium chloride flush  3 mL Intravenous Q12H   sorbitol  30 mL Oral Daily   tamsulosin  0.4 mg Oral QPC supper   topiramate  100 mg Oral BID   Continuous Infusions:  sodium chloride Stopped (11/06/21 1119)   sodium chloride Stopped (11/10/21 0152)   sodium chloride 10 mL/hr at 11/11/21 0700   amiodarone 30 mg/hr (11/11/21 0426)   diltiazem (CARDIZEM) infusion 10 mg/hr (11/11/21 0152)   metronidazole 500 mg (11/11/21 0837)   PRN Meds: sodium chloride, sodium chloride, acetaminophen **OR** acetaminophen, ALPRAZolam, alum & mag hydroxide-simeth, benzonatate, dextromethorphan, levalbuterol, lip balm, metoCLOPramide (REGLAN)  injection, nitroGLYCERIN, sodium chloride flush   Vital Signs    Vitals:   11/11/21 0345 11/11/21 0400 11/11/21 0700 11/11/21 0747  BP:  100/73 109/62   Pulse:  88 (!) 104   Resp:  16 17   Temp: 97.8 F (36.6 C)   98.1 F (36.7 C)  TempSrc: Oral   Oral  SpO2:  97% 96% 98%    Intake/Output Summary (Last 24 hours) at 11/11/2021 1034 Last data filed at 11/11/2021 0700 Gross per 24 hour  Intake 586.79 ml  Output 2300 ml  Net -1713.21 ml   Last 3 Weights 10/28/2021 10/23/2021 10/20/2021  Weight (lbs) 228 lb 8 oz 229 lb 12.8 oz 229 lb 1.6 oz  Weight (kg) 103.647 kg 104.237 kg 103.919 kg      Telemetry    Afib with HR better controlled 80-100s- Personally Reviewed  ECG    No new tracing- Personally Reviewed  Physical Exam   GEN: No acute distress.   Neck: No JVD Cardiac: Irregularly, irregular Respiratory: Rhonchorous GI: Soft, nontender, non-distended  MS: 1+ pitting bilateral LE edema; No deformity. Neuro:  Nonfocal  Psych: Normal affect   Labs    High Sensitivity Troponin:  No results for input(s): TROPONINIHS in the last 720 hours.   Chemistry Recent Labs  Lab  11/09/21 0445 11/10/21 0050 11/10/21 0316 11/11/21 0242  NA 128* 129* 129* 131*  K 3.7 3.4* 3.5 4.0  CL 95* 93* 94* 96*  CO2 26 27 28 29   GLUCOSE 119* 113* 111* 137*  BUN 9 10 10 12   CREATININE 0.75 0.78 0.83 0.72  CALCIUM 8.4* 8.5* 8.5* 8.7*  MG 1.7  --  1.6* 2.0  PROT  --  6.0*  --   --   ALBUMIN  --  2.7*  --   --   AST  --  24  --   --   ALT  --  5  --   --   ALKPHOS  --  49  --   --   BILITOT  --  0.7  --   --   GFRNONAA >60 >60 >60 >60  ANIONGAP 7 9 7 6     Lipids No results for input(s): CHOL, TRIG, HDL, LABVLDL, LDLCALC, CHOLHDL in the last 168 hours.  Hematology Recent Labs  Lab 11/10/21 0050 11/10/21 0316 11/11/21 0242  WBC 7.4 7.6 7.1  RBC 2.46* 2.38* 2.45*  HGB 8.4* 8.0* 8.4*  HCT 25.1* 24.4* 25.3*  MCV 102.0* 102.5* 103.3*  MCH 34.1* 33.6 34.3*  MCHC 33.5 32.8 33.2  RDW  14.8 14.8 14.6  PLT 72* 71* 93*   Thyroid No results for input(s): TSH, FREET4 in the last 168 hours.  BNP No results for input(s): BNP, PROBNP in the last 168 hours.   DDimer No results for input(s): DDIMER in the last 168 hours.   Radiology    MR BRAIN W WO CONTRAST  Result Date: 11/11/2021 CLINICAL DATA:  Follow-up examination for stroke. EXAM: MRI HEAD WITHOUT AND WITH CONTRAST TECHNIQUE: Multiplanar, multiecho pulse sequences of the brain and surrounding structures were obtained without and with intravenous contrast. CONTRAST:  11mL GADAVIST GADOBUTROL 1 MMOL/ML IV SOLN COMPARISON:  Prior CT from earlier the same day. FINDINGS: Brain: Examination moderately degraded by motion artifact. Generalized age-related cerebral atrophy. Patchy and confluent T2/FLAIR hyperintensity involving the periventricular deep white matter both cerebral hemispheres most consistent with chronic small vessel ischemic disease, mild to moderate in nature. Area of encephalomalacia and gliosis involving the left parietal lobe, consistent with a chronic ischemic infarct, posterior left MCA distribution. No abnormal foci of restricted diffusion to suggest acute or subacute ischemia. Gray-white matter differentiation otherwise maintained. No other areas of chronic cortical infarction. No visible acute or chronic intracranial blood products. No mass lesion, midline shift or mass effect. Mild ventricular prominence related to global parenchymal volume loss without hydrocephalus. No extra-axial fluid collection. Partially empty sella noted. Midline structures intact. No visible abnormal enhancement. Vascular: Major intracranial vascular flow voids are maintained. Skull and upper cervical spine: Craniocervical junction within normal limits. Bone marrow signal intensity grossly normal. No scalp soft tissue abnormality. Sinuses/Orbits: Globes and orbital soft tissues demonstrate no acute finding. Mild mucosal thickening noted within  the ethmoidal air cells. Paranasal sinuses are otherwise clear. Trace right mastoid effusion noted, of doubtful significance. Other: None. IMPRESSION: 1. No acute intracranial infarct or other abnormality. 2. Chronic left parietal infarct. 3. Underlying mild to moderate chronic microvascular ischemic disease. Electronically Signed   By: Jeannine Boga M.D.   On: 11/11/2021 00:32   CT HEAD CODE STROKE WO CONTRAST  Result Date: 11/10/2021 CLINICAL DATA:  Code stroke. Initial evaluation for neuro deficit, stroke suspected. EXAM: CT HEAD WITHOUT CONTRAST TECHNIQUE: Contiguous axial images were obtained from the base of the skull through the vertex  without intravenous contrast. RADIATION DOSE REDUCTION: This exam was performed according to the departmental dose-optimization program which includes automated exposure control, adjustment of the mA and/or kV according to patient size and/or use of iterative reconstruction technique. COMPARISON:  CT from 07/06/2012. FINDINGS: Brain: Generalized age-related cerebral atrophy with chronic microvascular ischemic disease. Remote posterior left MCA distribution infarct involving the left parietal lobe noted. No acute intracranial hemorrhage. No acute large vessel territory infarct. No mass lesion or midline shift. No hydrocephalus or extra-axial fluid collection. Vascular: No hyperdense vessel. Scattered vascular calcifications noted within the carotid siphons. Skull: Scalp soft tissues and calvarium within normal limits. Sinuses/Orbits: Globes and orbital soft tissues demonstrate no acute finding. Paranasal sinuses and mastoid air cells are clear. Other: None. ASPECTS [facility] Stroke Program Early CT Score) - Ganglionic level infarction (caudate, lentiform nuclei, internal capsule, insula, M1-M3 cortex): 7 - Supraganglionic infarction (M4-M6 cortex): 3 Total score (0-10 with 10 being normal): 10 IMPRESSION: 1. No acute intracranial abnormality. 2. ASPECTS is 10. 3.  Chronic left parietal lobe infarct, posterior left MCA distribution. 4. Underlying age-related cerebral atrophy with chronic microvascular ischemic disease. These results were communicated to Dr. Sabino Gasser At 12:12 am on 11/10/2021 by text page via the [facility] messaging system. Electronically Signed   By: Jeannine Boga M.D.   On: 11/10/2021 00:13   CT ANGIO HEAD NECK W WO CM W PERF (CODE STROKE)  Result Date: 11/10/2021 CLINICAL DATA:  Initial evaluation for neuro deficit, stroke. EXAM: CT ANGIOGRAPHY HEAD AND NECK TECHNIQUE: Multidetector CT imaging of the head and neck was performed using the standard protocol during bolus administration of intravenous contrast. Multiplanar CT image reconstructions and MIPs were obtained to evaluate the vascular anatomy. Carotid stenosis measurements (when applicable) are obtained utilizing NASCET criteria, using the distal internal carotid diameter as the denominator. RADIATION DOSE REDUCTION: This exam was performed according to the departmental dose-optimization program which includes automated exposure control, adjustment of the mA and/or kV according to patient size and/or use of iterative reconstruction technique. CONTRAST:  145mL OMNIPAQUE IOHEXOL 350 MG/ML SOLN COMPARISON:  Prior head CT from earlier the same day. FINDINGS: CTA NECK FINDINGS Aortic arch: Visualized aortic arch normal in caliber with normal branch pattern. Mild-to-moderate atheromatous change about the arch and origin of the great vessels without hemodynamically significant stenosis. Right carotid system: Right CCA patent from its origin to the bifurcation without stenosis. Moderate atheromatous plaque about the right carotid bulb/proximal right ICA without significant stenosis. Right ICA patent distally without stenosis or dissection. Left carotid system: Left CCA patent from its origin to the bifurcation without stenosis. Prominent atheromatous plaque about the left carotid bulb/proximal left ICA with  associated stenosis of up to approximately 70-80% by NASCET criteria (series 9, image 210). Exact measurements difficult given motion and streak artifact through this region. Left ICA tortuous but otherwise patent distally to the skull base. Vertebral arteries: Both vertebral arteries arise from the subclavian arteries. No significant proximal subclavian artery stenosis. Atheromatous change about the origins and proximal V1 segments bilaterally with associated moderate up to approximate 50% stenoses. Vertebral arteries otherwise patent distally without stenosis or dissection. Skeleton: No discrete or worrisome osseous lesions. Prior fusion at C5-C6. Other neck: No other acute soft tissue abnormality within the neck. Upper chest: Question possible filling defects involving the left main pulmonary artery, which could reflect acute pulmonary embolus (series 9, image 396). Finding not entirely certain given timing of the contrast bolus on this exam. Consolidative opacity noted within the visualized left  lung similar to previous. Bilateral pleural effusions partially visualized. Degree of underlying pulmonary interstitial edema noted. Review of the MIP images confirms the above findings CTA HEAD FINDINGS Anterior circulation: Examination somewhat limited by timing the contrast bolus. Petrous segments patent bilaterally. Atheromatous change within the carotid siphons with associated mild to moderate multifocal narrowing. A1 segments patent bilaterally. Normal anterior communicating artery complex. Anterior cerebral arteries perfused to their distal aspects without visible stenosis. No M1 stenosis or occlusion. Normal MCA bifurcations. Distal MCA branches perfused and symmetric. Posterior circulation: Both V4 segments patent without stenosis. Left PICA patent. Right PICA not definitely seen. Basilar patent to its distal aspect without stenosis. Superior cerebellar arteries patent bilaterally. Both PCAs primarily supplied  via the basilar well perfused or distal aspects. Venous sinuses: Patent allowing for timing the contrast bolus. Anatomic variants: None significant.  No visible aneurysm. Review of the MIP images confirms the above findings IMPRESSION: 1. Negative CTA for large vessel occlusion. 2. 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 3. Atheromatous change about the origins of both vertebral arteries with associated moderate multifocal stenoses. 4. Question filling defects involving the left main pulmonary artery, suspicious for acute pulmonary emboli. Further evaluation with dedicated cross-sectional imaging of the chest recommended. 5. Consolidative opacity within the visualized left lung, similar to prior CT from 10/15/2021. 6. Layering bilateral pleural effusions with underlying pulmonary interstitial edema, partially visualized. Critical Value/emergent results were called by telephone at the time of interpretation on 11/10/2021 at 3:23 am to the Nurse practitioner taking care of the patient, Quinn Plowman , who verbally acknowledged these results. Electronically Signed   By: Jeannine Boga M.D.   On: 11/10/2021 03:24    Cardiac Studies   2D echo 08/2021  1. Left ventricular ejection fraction, by estimation, is 65 to 70%. The  left ventricle has normal function. The left ventricle has no regional  wall motion abnormalities. There is mild left ventricular hypertrophy.  Left ventricular diastolic parameters  are indeterminate.   2. Right ventricular systolic function mild to moderately reduced. The  right ventricular size is mildly enlarged. There is normal pulmonary  artery systolic pressure.   3. Trivial mitral valve regurgitation.   4. The aortic valve is tricuspid. Aortic valve regurgitation is not  visualized. Mild aortic valve sclerosis is present, with no evidence of  aortic valve stenosis.   5.  Aortic dilatation noted. There is mild dilatation of the ascending  aorta, measuring 40 mm.   6. The inferior vena cava is normal in size with greater than 50%  respiratory variability, suggesting right atrial pressure of 3 mmHg.  MRI Head 11/11/21: IMPRESSION: 1. No acute intracranial infarct or other abnormality. 2. Chronic left parietal infarct. 3. Underlying mild to moderate chronic microvascular ischemic disease.   Patient Profile     75 y.o. male with CAD s/p remote PCIs of RCA 2004 and 2007 with otherwise mild nonbstructive CAD, stage 4 lung CA, CVA, paroxysmal atrial fibrillation on Tikosyn, ILR in place, QT prolongation, HTN, HLD, prostate CA, OSA on CPAP, mild carotid artery disease (2-44% RICA, 01-02% LICA by duplex [id number]/5366), prior anemia requiring transfusions (felt chemo related), mild dilation of ascending aorta who presented with weakness, cough and DOE found to have possible CAP and afib with RVR for which cardiology has been consulted.   Assessment & Plan    #Concern for TIA: Patient with episode of expressive aphasia on 2/6 concern  for acute stroke vs TIA. CT head without acute abnormality. CTA head and neck with 60-63% LICA but no large vessel occlusion. MRI head with no acute intracranial infarct but with chronic left parietal infarct and mild-to-moderate chronic microvascular disease. Symptoms have since resolved. -Recommend resuming xarelto if okay from neuro perspective -Recommended for vascular evaluation for 01-60% LICA  #Possible PE noted on CTA: Noted to have possible filling defect on CTA head and neck that was obtained due to expressive aphasia as above. -Will need dedicated CTA chest once able -Recommend resuming xarelto if okay from neuro perspective  #Atrial fibrillation with RVR Recent ILR with 0% Afib and thus suspect RVR now driven by acute ilness. Tikosyn stopped this admission due to prolonged QT and hyponatremia. Was placed on amiodarone bolus +gtt and  then temporarily on PO amiodarone but developed recurrent RVR and therefore amiodarone gtt was restarted. AC has been held due to acute stroke.  Can consider DCCV in the future if remains in Afib, however, given acute illness, he is unlikely to maintain rhythm at this time. -Continue dilt gtt for now; hopefully can wean off by tomorrow -Continue amiodarone gtt for now -Increase metop to 37.5mg  BID as now has blood pressure room -S/p digoxin load; can add oral dig if needed -Restart xarelto if okay from neuro perspective  #Possible CAP #Lung cancer stage IV On cefepime for planned 8 day course. Agree with palliative care assessment. -Management per critical care  #Hyponatremia and pancytopenia: Likely related to underlying malignancy.  -Management per primary  #Suspected Diastolic HF: #RV Dysfunction: #LE edema: Agree that low albumin likely contributing to third spacing. Also may be related to mild CHF in the setting of Afib with RVR. Intermittently dosing albumin and lasix. -Continue judicious diuresis as needed -Compression socks    For questions or updates, please contact [place] Please consult [email or web address] for contact info under        Signed, Freada Bergeron, MD  11/11/2021, 10:34 AM

## 2021-11-10 NOTE — Progress Notes (Signed)
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  He presented with a left lung mass in addition to left hilar adenopathy and malignant left pleural effusion, intrathoracic and upper abdominal lymph nodes, left pleural/subpleural nodularity and hepatic lesions diagnosed in May 2022.  He has no actionable mutations and PD-L1 expression was 30%.  He initially received palliative radiotherapy to his obstructive lung mass and then received systemic chemotherapy with carboplatin for an AUC of 5, Alimta 500 mg/m, and Keytruda 200 mg IV every 3 weeks.  Starting cycle #2 the carboplatin was dose reduced to an AUC of 4 and the Alimta was dose reduced to 400 mg/m.  Beginning of cycle #5 he was placed on maintenance treatment with Alimta and Keytruda.  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  He had a recent restaging CT scan which was reviewed with him at his last visit.  It showed new multifocal hepatic metastasis.  Due to these findings, it was recommended for him to begin Alimta 500 mg per metered squared as a single agent.  He resumed his treatment on 10/28/2021.  I talked with the patient today regarding his current condition.  Cardiology following for A-fib with RVR.  He remains on amiodarone drip and they plan to trial IV dig today.  Pulmonology was consulted earlier today who is starting him on steroids for possible component of pneumonitis.  He also remains on IV antibiotics.  He is currently undergoing work-up for possible stroke.  MRI brain pending today.  Anticoagulation being held pending MRI results.  Also noted to have possible PE on CTA head and neck.  Recommend CTA chest when medically stable and resume anticoagulation once cleared by neurology.  From oncology standpoint, he has had  difficulty tolerating chemotherapy in the past.  He has had continued decline in his performance status.  We cannot rechallenge him with immunotherapy given immune mediated pneumonitis in the past.  Treatment options therefore are limited.  Recommend plan of care consult to discuss goals of care.  Hospice is reasonable from our standpoint.  However, if the patient improves, we will reevaluate him as an outpatient to discuss additional treatment options.  [name], DNP, AGPCNP-BC, AOCNP  SUBJECTIVE: 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  He was initially diagnosed in May 2022.  He has received prior palliative radiation to his obstructive lung mass.  He has been receiving palliative systemic chemotherapy initially with carboplatin for an AUC of 5, Alimta 500 mg/m, and Keytruda 200 mg IV every 3 weeks.  Starting from cycle #5, he was placed on maintenance treatment with Alimta and Keytruda every 3 weeks.  Beryle Flock was placed on hold due to significant immunotherapy mediated pneumonitis so he has been receiving single agent Alimta.  He is currently admitted with A-fib with RVR.  Cardiology following.  Developed expressive aphasia yesterday and had a CT head which showed no acute intracranial abnormality.  Was evaluated by teleneurologist overnight due to expressive aphasia.  He had a CTA head and neck performed which was negative for large vessel occlusion, atheromatous plaque about the left carotid bulb/proximal left ICA with associated stenosis of up to 70 to 80%, atheromatous change about the origins of both vertebral arteries with associated moderate multifocal stenoses, question filling defects involving the left  main pulmonary artery suspicious for acute PE, consolidative opacity in the left lung similar to prior, layering bilateral pleural effusions with underlying pulmonary interstitial edema.  He was given aspirin 325 mg x 1.   Teleneurology recommended holding Xarelto.  This morning, he has some mild confusion and mild slurred speech.  Awaiting MRI brain.  He continues to have tachycardia.  He reports cough and shortness of breath.  No chest pain reported.   Oncology History  Malignant neoplasm of prostate ([place])  01/22/2020 Cancer Staging   Staging form: Prostate, AJCC 8th Edition - Clinical stage from 01/22/2020: Stage IIIC (cT2c, cN0, cM0, PSA: 9.6, Grade Group: 5) - Signed by Freeman Caldron, PA-C on 03/04/2020    03/04/2020 Initial Diagnosis   Malignant neoplasm of prostate ([place])   04/09/2021 Genetic Testing   Positive genetic testing:  A single, heterozygous pathogenic variant was detected in the ATM gene called c.7293_7294del (p.Lys2431Asnfs*18). 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 Testing was completed through the Common Hereditary Cancers panel and Prostate Cancer HRR panel offered by [facility] laboratories. The report date is 04/09/2021.   The Common Hereditary Cancers Panel offered by [facility] includes sequencing and/or deletion duplication testing of the following 47 genes: APC, ATM, AXIN2, BARD1, BMPR1A, BRCA1, BRCA2, BRIP1, CDH1, CDK4, CDKN2A (p14ARF), CDKN2A (p16INK4a), CHEK2, CTNNA1, DICER1, EPCAM (Deletion/duplication testing only), GREM1 (promoter region deletion/duplication testing only), KIT, MEN1, MLH1, MSH2, MSH3, MSH6, MUTYH, NBN, NF1, NTHL1, PALB2, PDGFRA, PMS2, POLD1, POLE, PTEN, RAD50, RAD51C, RAD51D, SDHB, SDHC, SDHD, SMAD4, SMARCA4. STK11, TP53, TSC1, TSC2, and VHL.  The following genes were evaluated for sequence changes only: SDHA and HOXB13 c.251G>A variant only. The Prostate Cancer HRR Panel offered by [facility] includes sequencing and/or deletion duplication testing of the following 10 genes: ATM, BARD1, BRCA1, BRCA2, BRIP1, CHEK2, FANCL, PALB2, RAD51C, RAD51D.   Non-small cell carcinoma of left lung, stage 4 (HCC)   03/05/2021 Initial Diagnosis   Non-small cell carcinoma of left lung, stage 4 ([name])   03/05/2021 Cancer Staging   Staging form: Lung, AJCC 8th Edition - Clinical: Stage IVA (cT1b, cN1, cM1a) - Signed by Curt Bears, MD on 03/05/2021    03/31/2021 -  Chemotherapy   Patient is on Treatment Plan : LUNG CARBOplatin / Pemetrexed / Pembrolizumab q21d Induction x 4 cycles / Maintenance Pemetrexed + Pembrolizumab     04/09/2021 Genetic Testing   Positive genetic testing:  A single, heterozygous pathogenic variant was detected in the ATM gene called c.7293_7294del (p.Lys2431Asnfs*18). 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 Testing was completed through the Common Hereditary Cancers panel and Prostate Cancer HRR panel offered by [facility] laboratories. The report date is 04/09/2021.   The Common Hereditary Cancers Panel offered by [facility] includes sequencing and/or deletion duplication testing of the following 47 genes: APC, ATM, AXIN2, BARD1, BMPR1A, BRCA1, BRCA2, BRIP1, CDH1, CDK4, CDKN2A (p14ARF), CDKN2A (p16INK4a), CHEK2, CTNNA1, DICER1, EPCAM (Deletion/duplication testing only), GREM1 (promoter region deletion/duplication testing only), KIT, MEN1, MLH1, MSH2, MSH3, MSH6, MUTYH, NBN, NF1, NTHL1, PALB2, PDGFRA, PMS2, POLD1, POLE, PTEN, RAD50, RAD51C, RAD51D, SDHB, SDHC, SDHD, SMAD4, SMARCA4. STK11, TP53, TSC1, TSC2, and VHL.  The following genes were evaluated for sequence changes only: SDHA and HOXB13 c.251G>A variant only. The Prostate Cancer HRR Panel offered by [facility] includes sequencing and/or deletion duplication testing of the following 10 genes: ATM, BARD1, BRCA1, BRCA2, BRIP1, CHEK2, FANCL, PALB2, RAD51C, RAD51D.  Primary cancer of left lower lobe of lung ([place])  03/07/2021 Initial Diagnosis   Primary cancer of left lower lobe of lung ([place])   03/18/2021 Cancer Staging   Staging form: Lung, AJCC 8th Edition -  Clinical: Stage IVA (cT1b, cN1, cM1a) - Signed by Curt Bears, MD on 03/18/2021    04/09/2021 Genetic Testing   Positive genetic testing:  A single, heterozygous pathogenic variant was detected in the ATM gene called c.7293_7294del (p.Lys2431Asnfs*18). 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 Testing was completed through the Common Hereditary Cancers panel and Prostate Cancer HRR panel offered by [facility] laboratories. The report date is 04/09/2021.   The Common Hereditary Cancers Panel offered by [facility] includes sequencing and/or deletion duplication testing of the following 47 genes: APC, ATM, AXIN2, BARD1, BMPR1A, BRCA1, BRCA2, BRIP1, CDH1, CDK4, CDKN2A (p14ARF), CDKN2A (p16INK4a), CHEK2, CTNNA1, DICER1, EPCAM (Deletion/duplication testing only), GREM1 (promoter region deletion/duplication testing only), KIT, MEN1, MLH1, MSH2, MSH3, MSH6, MUTYH, NBN, NF1, NTHL1, PALB2, PDGFRA, PMS2, POLD1, POLE, PTEN, RAD50, RAD51C, RAD51D, SDHB, SDHC, SDHD, SMAD4, SMARCA4. STK11, TP53, TSC1, TSC2, and VHL.  The following genes were evaluated for sequence changes only: SDHA and HOXB13 c.251G>A variant only. The Prostate Cancer HRR Panel offered by [facility] includes sequencing and/or deletion duplication testing of the following 10 genes: ATM, BARD1, BRCA1, BRCA2, BRIP1, CHEK2, FANCL, PALB2, RAD51C, RAD51D.      REVIEW OF SYSTEMS:   Review of Systems  Constitutional:  Positive for malaise/fatigue. Negative for chills and fever.  HENT: Negative.    Eyes: Negative.   Respiratory:  Positive for cough and shortness of breath.   Cardiovascular:  Negative for chest pain.  Gastrointestinal: Negative.   Skin: Negative.   Neurological:  Positive for speech change. Negative for headaches.  Psychiatric/Behavioral: Negative.     I have reviewed the past medical history, past surgical history, social history and family history with the  patient and they are unchanged from previous note.   PHYSICAL EXAMINATION: ECOG PERFORMANCE STATUS: 3 - Symptomatic, >50% confined to bed  Vitals:   11/10/21 0800 11/10/21 0817  BP:    Pulse:    Resp:    Temp: 98 F (36.7 C)   SpO2:  100%   There were no vitals filed for this visit.  Intake/Output from previous day: 02/06 0701 - 02/07 0700 In: 1364.3 [I.V.:585.2; IV Piggyback:779.1] Out: 2993 [Urine:4975]  Physical Exam Vitals reviewed.  Constitutional:      General: He is not in acute distress. HENT:     Head: Normocephalic.  Eyes:     General: No scleral icterus.    Conjunctiva/sclera: Conjunctivae normal.  Cardiovascular:     Rate and Rhythm: Tachycardia present. Rhythm irregular.  Pulmonary:     Breath sounds: Rhonchi present.  Abdominal:     General: There is no distension.     Palpations: Abdomen is soft.  Skin:    General: Skin is warm and dry.  Neurological:     Mental Status: He is alert and oriented to person, place, and time.  Psychiatric:        Mood and Affect: Mood normal.        Behavior: Behavior normal.        Thought Content: Thought content normal.        Judgment: Judgment normal.    LABORATORY DATA:  I have reviewed the data as listed CMP Latest Ref Rng & Units 11/10/2021 11/10/2021 11/09/2021  Glucose 70 - 99 mg/dL 111(H) 113(H) 119(H)  BUN 8 - 23 mg/dL 10 10 9   Creatinine 0.61 - 1.24 mg/dL 0.83 0.78 0.75  Sodium 135 - 145 mmol/L 129(L) 129(L) 128(L)  Potassium 3.5 - 5.1 mmol/L 3.5 3.4(L) 3.7  Chloride 98 - 111 mmol/L 94(L) 93(L) 95(L)  CO2 22 - 32 mmol/L 28 27 26   Calcium 8.9 - 10.3 mg/dL 8.5(L) 8.5(L) 8.4(L)  Total Protein 6.5 - 8.1 g/dL - 6.0(L) -  Total Bilirubin 0.3 - 1.2 mg/dL - 0.7 -  Alkaline Phos 38 - 126 U/L - 49 -  AST 15 - 41 U/L - 24 -  ALT 0 - 44 U/L - 5 -    Lab Results  Component Value Date   WBC 7.6 11/10/2021   HGB 8.0 (L) 11/10/2021   HCT 24.4 (L) 11/10/2021   MCV 102.5 (H) 11/10/2021   PLT 71 (L) 11/10/2021    NEUTROABS 5.6 11/10/2021    No results found for: CEA1, CEA, CAN199, CA125, PSA1  DG Chest 2 View  Result Date: 11/03/2021 CLINICAL DATA:  Cough EXAM: CHEST - 2 VIEW COMPARISON:  CT chest 10/15/2021 FINDINGS: Cardiomegaly and mediastinum appear unchanged. Calcified plaques in the aortic arch. Left-sided atrial loop recorder. Right-sided central venous port with the tip in the region of the SVC. Heart and mediastinum are deviated to the left similar to previous. Extensive irregular opacities throughout the left lung again seen with relative sparing in the upper lung zone, similar to previous. Chronic changes in the right lung base. No significant pleural effusion. No pneumothorax visualized. IMPRESSION: No significant change since previous study including extensive irregular opacities in the left lung. Electronically Signed   By: Ofilia Neas M.D.   On: 11/03/2021 10:55   CT Chest W Contrast  Result Date: 10/16/2021 CLINICAL DATA:  Primary Cancer Type: Lung Imaging Indication: Active Surveillance Interval therapy since last imaging? No Initial Cancer Diagnosis Date: 02/25/2021; Established by: Biopsy-proven Detailed Pathology: Stage IV non-small cell lung cancer, adenocarcinoma. Primary Tumor location:  Left lower lobe. Surgeries: Coronary stent. Lumbar fusion. Chemotherapy: Yes; Ongoing? No; Most recent administration: 07/04/2021 Immunotherapy?  Yes; Type: Keytruda; Ongoing? No Radiation therapy? 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 EXAM: CT CHEST, ABDOMEN, AND PELVIS WITH CONTRAST TECHNIQUE: Multidetector CT imaging of the chest, abdomen and pelvis was performed following the standard protocol during bolus administration of intravenous contrast. RADIATION DOSE REDUCTION: This exam was performed according to the departmental dose-optimization program which includes automated  exposure control, adjustment of the mA and/or kV according to patient size and/or use of iterative reconstruction technique. CONTRAST:  166m OMNIPAQUE IOHEXOL 300 MG/ML  SOLN COMPARISON:  Most recent CT chest, abdomen and pelvis 07/31/2021. 03/24/2021 PET-CT. FINDINGS: CT CHEST FINDINGS Cardiovascular: Coronary artery calcification and aortic atherosclerotic calcification. Mediastinum/Nodes: Small paratracheal lymph nodes are not pathologic by size criteria and not change from prior. Esophagus normal. Trachea and Lungs/Pleura: Dense consolidation with air bronchograms in the lingula unchanged from prior. Consolidation in the LEFT lower lobe is slightly increased (image 104/4). No discrete nodularity. Volume loss in the LEFT hemithorax. No nodularity in the RIGHT lung. Musculoskeletal: No aggressive osseous lesion. CT ABDOMEN AND PELVIS FINDINGS Hepatobiliary: Previous described lesion in the caudate lobe is increased in size measuring 4.1 x 3.2 cm compared to 2.6 x 1.5 cm. New lesion in the superior RIGHT hepatic lobe measures 2.2 cm (image 53/2). Subtle  new lesion in the inferior RIGHT hepatic lobe measures 1.4 cm on image 66/2. A subcentimeter lesion in the RIGHT hepatic lobe on image 68/2 is also suspicious Pancreas: Pancreas is normal. No ductal dilatation. No pancreatic inflammation. Spleen: Normal spleen Adrenals/urinary tract: Adrenal glands and kidneys are normal. The ureters and bladder normal. Stomach/Bowel: Stomach, small bowel, appendix, and cecum are normal. The colon and rectosigmoid colon are normal. Vascular/Lymphatic: Abdominal aorta is normal caliber with atherosclerotic calcification. There is no retroperitoneal or periportal lymphadenopathy. No pelvic lymphadenopathy. Reproductive: Prostate normal Other: No free fluid. Musculoskeletal: Stable sclerosis within the posterior RIGHT iliac bone along the SI joint (image 104/2). Posterior lumbar fusion IMPRESSION: Chest Impression: 1. Essentially  stable dense consolidation in the lingula and LEFT lower lobe. 2. No mediastinal lymphadenopathy. Abdomen / Pelvis Impression: 1. Unfortunately there is NEW multifocal hepatic metastasis and enlargement of the previous caudate lobe metastasis. 2. No additional evidence of metastatic disease in the abdomen pelvis. 3. No evidence skeletal metastasis Electronically Signed   By: Suzy Bouchard M.D.   On: 10/16/2021 10:43   CT Abdomen Pelvis W Contrast  Result Date: 10/16/2021 CLINICAL DATA:  Primary Cancer Type: Lung Imaging Indication: Active Surveillance Interval therapy since last imaging? No Initial Cancer Diagnosis Date: 02/25/2021; Established by: Biopsy-proven Detailed Pathology: Stage IV non-small cell lung cancer, adenocarcinoma. Primary Tumor location:  Left lower lobe. Surgeries: Coronary stent. Lumbar fusion. Chemotherapy: Yes; Ongoing? No; Most recent administration: 07/04/2021 Immunotherapy?  Yes; Type: Keytruda; Ongoing? No Radiation therapy? 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 EXAM: CT CHEST, ABDOMEN, AND PELVIS WITH CONTRAST TECHNIQUE: Multidetector CT imaging of the chest, abdomen and pelvis was performed following the standard protocol during bolus administration of intravenous contrast. RADIATION DOSE REDUCTION: This exam was performed according to the departmental dose-optimization program which includes automated exposure control, adjustment of the mA and/or kV according to patient size and/or use of iterative reconstruction technique. CONTRAST:  171m OMNIPAQUE IOHEXOL 300 MG/ML  SOLN COMPARISON:  Most recent CT chest, abdomen and pelvis 07/31/2021. 03/24/2021 PET-CT. FINDINGS: CT CHEST FINDINGS Cardiovascular: Coronary artery calcification and aortic atherosclerotic calcification. Mediastinum/Nodes: Small paratracheal lymph nodes are not pathologic by size criteria and  not change from prior. Esophagus normal. Trachea and Lungs/Pleura: Dense consolidation with air bronchograms in the lingula unchanged from prior. Consolidation in the LEFT lower lobe is slightly increased (image 104/4). No discrete nodularity. Volume loss in the LEFT hemithorax. No nodularity in the RIGHT lung. Musculoskeletal: No aggressive osseous lesion. CT ABDOMEN AND PELVIS FINDINGS Hepatobiliary: Previous described lesion in the caudate lobe is increased in size measuring 4.1 x 3.2 cm compared to 2.6 x 1.5 cm. New lesion in the superior RIGHT hepatic lobe measures 2.2 cm (image 53/2). Subtle new lesion in the inferior RIGHT hepatic lobe measures 1.4 cm on image 66/2. A subcentimeter lesion in the RIGHT hepatic lobe on image 68/2 is also suspicious Pancreas: Pancreas is normal. No ductal dilatation. No pancreatic inflammation. Spleen: Normal spleen Adrenals/urinary tract: Adrenal glands and kidneys are normal. The ureters and bladder normal. Stomach/Bowel: Stomach, small bowel, appendix, and cecum are normal. The colon and rectosigmoid colon are normal. Vascular/Lymphatic: Abdominal aorta is normal caliber with atherosclerotic calcification. There is no retroperitoneal or periportal lymphadenopathy. No pelvic lymphadenopathy. Reproductive: Prostate normal Other: No free fluid. Musculoskeletal: Stable sclerosis within the posterior RIGHT iliac bone along the SI joint (image 104/2). Posterior lumbar fusion IMPRESSION: Chest  Impression: 1. Essentially stable dense consolidation in the lingula and LEFT lower lobe. 2. No mediastinal lymphadenopathy. Abdomen / Pelvis Impression: 1. Unfortunately there is NEW multifocal hepatic metastasis and enlargement of the previous caudate lobe metastasis. 2. No additional evidence of metastatic disease in the abdomen pelvis. 3. No evidence skeletal metastasis Electronically Signed   By: Suzy Bouchard M.D.   On: 10/16/2021 10:43   CUP PACEART REMOTE DEVICE CHECK  Result  Date: 10/26/2021 ILR summary report received. Battery status OK. Normal device function. No new symptom, tachy, brady, or pause episodes. No new AF episodes. Monthly summary reports and ROV/PRN LA  CT HEAD CODE STROKE WO CONTRAST  Result Date: 11/10/2021 CLINICAL DATA:  Code stroke. Initial evaluation for neuro deficit, stroke suspected. EXAM: CT HEAD WITHOUT CONTRAST TECHNIQUE: Contiguous axial images were obtained from the base of the skull through the vertex without intravenous contrast. RADIATION DOSE REDUCTION: This exam was performed according to the departmental dose-optimization program which includes automated exposure control, adjustment of the mA and/or kV according to patient size and/or use of iterative reconstruction technique. COMPARISON:  CT from 07/06/2012. FINDINGS: Brain: Generalized age-related cerebral atrophy with chronic microvascular ischemic disease. Remote posterior left MCA distribution infarct involving the left parietal lobe noted. No acute intracranial hemorrhage. No acute large vessel territory infarct. No mass lesion or midline shift. No hydrocephalus or extra-axial fluid collection. Vascular: No hyperdense vessel. Scattered vascular calcifications noted within the carotid siphons. Skull: Scalp soft tissues and calvarium within normal limits. Sinuses/Orbits: Globes and orbital soft tissues demonstrate no acute finding. Paranasal sinuses and mastoid air cells are clear. Other: None. ASPECTS [facility] Stroke Program Early CT Score) - Ganglionic level infarction (caudate, lentiform nuclei, internal capsule, insula, M1-M3 cortex): 7 - Supraganglionic infarction (M4-M6 cortex): 3 Total score (0-10 with 10 being normal): 10 IMPRESSION: 1. No acute intracranial abnormality. 2. ASPECTS is 10. 3. Chronic left parietal lobe infarct, posterior left MCA distribution. 4. Underlying age-related cerebral atrophy with chronic microvascular ischemic disease. These results were communicated to Dr.  Sabino Gasser At 12:12 am on 11/10/2021 by text page via the [facility] messaging system. Electronically Signed   By: Jeannine Boga M.D.   On: 11/10/2021 00:13   VAS [place] LOWER EXTREMITY VENOUS (DVT)  Result Date: 11/07/2021  Lower Venous DVT Study Patient Name:  RAHMON HEIGL  Date of Exam:   11/06/2021 Medical Rec #: [id number]    Accession #:    [id number] Date of Birth: [birth year]   Patient Gender: M Patient Age:   37 years Exam Location:  [facility] Procedure:      VAS [place] LOWER EXTREMITY VENOUS (DVT) Referring Phys: Dwyane Dee --------------------------------------------------------------------------------  Indications: Swelling - LLE.  Risk Factors: CA patient on chemotherapy. Anticoagulation: Xarelto. Comparison Study: Previous exam on 11/03/2021 was negative for DVT Performing Technologist: Rogelia Rohrer RVT, RDMS  Examination Guidelines: A complete evaluation includes B-mode imaging, spectral Doppler, color Doppler, and power Doppler as needed of all accessible portions of each vessel. Bilateral testing is considered an integral part of a complete examination. Limited examinations for reoccurring indications may be performed as noted. The reflux portion of the exam is performed with the patient in reverse Trendelenburg.  +---------+---------------+---------+-----------+----------+--------------+  RIGHT     Compressibility Phasicity Spontaneity Properties Thrombus Aging  +---------+---------------+---------+-----------+----------+--------------+  CFV       Full            Yes       Yes                                    +---------+---------------+---------+-----------+----------+--------------+  SFJ       Full                                                             +---------+---------------+---------+-----------+----------+--------------+  FV Prox   Full            Yes       Yes                                    +---------+---------------+---------+-----------+----------+--------------+  FV Mid    Full             Yes       Yes                                    +---------+---------------+---------+-----------+----------+--------------+  FV Distal Full            Yes       Yes                                    +---------+---------------+---------+-----------+----------+--------------+  PFV       Full                                                             +---------+---------------+---------+-----------+----------+--------------+  POP       Full            Yes       Yes                                    +---------+---------------+---------+-----------+----------+--------------+  PTV       Full                                                             +---------+---------------+---------+-----------+----------+--------------+  PERO      Full                                                             +---------+---------------+---------+-----------+----------+--------------+   +---------+---------------+---------+-----------+----------+--------------+  LEFT      Compressibility Phasicity Spontaneity Properties Thrombus Aging  +---------+---------------+---------+-----------+----------+--------------+  CFV       Full            Yes       Yes                                    +---------+---------------+---------+-----------+----------+--------------+  SFJ       Full                                                             +---------+---------------+---------+-----------+----------+--------------+  FV Prox   Full            Yes       Yes                                    +---------+---------------+---------+-----------+----------+--------------+  FV Mid    Full            Yes       Yes                                    +---------+---------------+---------+-----------+----------+--------------+  FV Distal Full            Yes       Yes                                    +---------+---------------+---------+-----------+----------+--------------+  PFV       Full                                                              +---------+---------------+---------+-----------+----------+--------------+  POP       Full            Yes       Yes                                    +---------+---------------+---------+-----------+----------+--------------+  PTV       Full                                                             +---------+---------------+---------+-----------+----------+--------------+  PERO      Full                                                             +---------+---------------+---------+-----------+----------+--------------+     Summary: BILATERAL: - No evidence of deep vein thrombosis seen in the lower extremities, bilaterally. - No evidence of superficial venous thrombosis in the lower extremities, bilaterally. -No evidence of popliteal cyst, bilaterally.   *See table(s) above for measurements and observations. Electronically signed by Harold Barban MD on 11/07/2021 at 5:28:58 PM.    Final    VAS [place] LOWER EXTREMITY VENOUS (DVT) (7a-7p)  Result Date: 11/03/2021  Lower Venous DVT Study Patient Name:  MASAJI BILLUPS  Date of Exam:   11/03/2021 Medical Rec #: [id number]    Accession #:    [id number] Date of Birth: 1946/02/19   Patient Gender: M Patient Age:   35 years Exam Location:  [facility] Procedure:      VAS [place] LOWER EXTREMITY VENOUS (DVT) Referring Phys: Thelma Comp NANAVATI --------------------------------------------------------------------------------  Indications: Pain.  Risk Factors: None identified. Comparison Study: No prior studies. Performing Technologist: Oliver Hum RVT  Examination Guidelines: A complete evaluation includes B-mode imaging, spectral Doppler, color Doppler, and power Doppler as needed of all accessible portions of each vessel. Bilateral testing is considered an integral part of a complete examination. Limited examinations for reoccurring indications may be performed as noted. The reflux portion of the exam is performed with the patient in reverse Trendelenburg.   +-----+---------------+---------+-----------+----------+--------------+  RIGHT Compressibility Phasicity Spontaneity Properties Thrombus Aging  +-----+---------------+---------+-----------+----------+--------------+  CFV   Full            Yes       Yes                                    +-----+---------------+---------+-----------+----------+--------------+   +---------+---------------+---------+-----------+----------+--------------+  LEFT      Compressibility Phasicity Spontaneity Properties Thrombus Aging  +---------+---------------+---------+-----------+----------+--------------+  CFV       Full            Yes       Yes                                    +---------+---------------+---------+-----------+----------+--------------+  SFJ       Full                                                             +---------+---------------+---------+-----------+----------+--------------+  FV Prox   Full                                                             +---------+---------------+---------+-----------+----------+--------------+  FV Mid    Full                                                             +---------+---------------+---------+-----------+----------+--------------+  FV Distal Full                                                             +---------+---------------+---------+-----------+----------+--------------+  PFV       Full                                                             +---------+---------------+---------+-----------+----------+--------------+  POP       Full            Yes       Yes                                    +---------+---------------+---------+-----------+----------+--------------+  PTV       Full                                                             +---------+---------------+---------+-----------+----------+--------------+  PERO      Full                                                              +---------+---------------+---------+-----------+----------+--------------+    Summary: RIGHT: - No evidence of common femoral vein obstruction.  LEFT: - There is no evidence of deep vein thrombosis in the lower extremity.  - No cystic structure found in the popliteal fossa.  *See table(s) above for measurements and observations. Electronically signed by Deitra Mayo MD on 11/03/2021 at 3:47:23 PM.    Final    CT ANGIO HEAD NECK W WO CM W PERF (CODE STROKE)  Result Date: 11/10/2021 CLINICAL DATA:  Initial evaluation for neuro deficit, stroke. EXAM: CT ANGIOGRAPHY HEAD AND NECK TECHNIQUE: Multidetector CT imaging of the head and neck was performed using the standard protocol during bolus administration of intravenous contrast. Multiplanar CT image reconstructions and MIPs were obtained to evaluate the vascular anatomy. Carotid stenosis measurements (when applicable) are obtained utilizing NASCET criteria, using the distal internal carotid diameter as the denominator. RADIATION DOSE REDUCTION: This exam was performed according to the departmental dose-optimization program which includes automated exposure control, adjustment of the mA and/or kV according to patient size and/or use of iterative reconstruction technique. CONTRAST:  178m OMNIPAQUE IOHEXOL 350 MG/ML SOLN COMPARISON:  Prior head CT from earlier the same day. FINDINGS: CTA NECK FINDINGS Aortic arch: Visualized aortic arch normal in caliber with normal branch pattern. Mild-to-moderate atheromatous change about the arch and origin of the great vessels without hemodynamically significant stenosis. Right carotid system: Right CCA patent from its origin to the bifurcation without stenosis. Moderate atheromatous plaque about the right carotid bulb/proximal right ICA without significant stenosis. Right ICA patent distally without stenosis or dissection. Left carotid system: Left CCA patent from its origin to the bifurcation without stenosis. Prominent  atheromatous plaque about the left carotid bulb/proximal left ICA with associated stenosis of up to approximately 70-80% by NASCET criteria (series 9, image 210). Exact measurements difficult given motion and streak artifact through this region. Left ICA tortuous but otherwise patent distally to the skull base. Vertebral arteries: Both vertebral arteries arise from the subclavian arteries. No significant proximal subclavian artery stenosis. Atheromatous change about the origins and proximal V1 segments bilaterally with associated moderate up to approximate 50% stenoses. Vertebral arteries otherwise patent distally without stenosis or dissection. Skeleton: No discrete or worrisome osseous lesions. Prior fusion at C5-C6. Other neck: No other acute soft tissue abnormality within the neck. Upper chest: Question possible  filling defects involving the left main pulmonary artery, which could reflect acute pulmonary embolus (series 9, image 396). Finding not entirely certain given timing of the contrast bolus on this exam. Consolidative opacity noted within the visualized left lung similar to previous. Bilateral pleural effusions partially visualized. Degree of underlying pulmonary interstitial edema noted. Review of the MIP images confirms the above findings CTA HEAD FINDINGS Anterior circulation: Examination somewhat limited by timing the contrast bolus. Petrous segments patent bilaterally. Atheromatous change within the carotid siphons with associated mild to moderate multifocal narrowing. A1 segments patent bilaterally. Normal anterior communicating artery complex. Anterior cerebral arteries perfused to their distal aspects without visible stenosis. No M1 stenosis or occlusion. Normal MCA bifurcations. Distal MCA branches perfused and symmetric. Posterior circulation: Both V4 segments patent without stenosis. Left PICA patent. Right PICA not definitely seen. Basilar patent to its distal aspect without stenosis. Superior  cerebellar arteries patent bilaterally. Both PCAs primarily supplied via the basilar well perfused or distal aspects. Venous sinuses: Patent allowing for timing the contrast bolus. Anatomic variants: None significant.  No visible aneurysm. Review of the MIP images confirms the above findings IMPRESSION: 1. Negative CTA for large vessel occlusion. 2. 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 3. Atheromatous change about the origins of both vertebral arteries with associated moderate multifocal stenoses. 4. Question filling defects involving the left main pulmonary artery, suspicious for acute pulmonary emboli. Further evaluation with dedicated cross-sectional imaging of the chest recommended. 5. Consolidative opacity within the visualized left lung, similar to prior CT from 10/15/2021. 6. Layering bilateral pleural effusions with underlying pulmonary interstitial edema, partially visualized. Critical Value/emergent results were called by telephone at the time of interpretation on 11/10/2021 at 3:23 am to the Nurse practitioner taking care of the patient, Quinn Plowman , who verbally acknowledged these results. Electronically Signed   By: Jeannine Boga M.D.   On: 11/10/2021 03:24     Future Appointments  Date Time Provider [place]  11/18/2021  8:30 AM [facility] [place] None  11/18/2021  9:00 AM Curt Bears, MD [facility] None  11/18/2021 10:00 AM [facility] INFUSION [facility] None  11/30/2021 11:30 AM [facility] DEVICE REMOTES [facility]  12/08/2021 11:00 AM [facility] [place] FLUSH [facility] None  12/08/2021 11:30 AM Heilingoetter, Cassandra L, PA-C [facility] None  12/08/2021 12:30 PM [facility] INFUSION [facility] None  12/11/2021 10:45 AM Tat, Eustace Quail, DO [facility] None  12/29/2021  9:00 AM [facility] [place] FLUSH [facility] None  12/29/2021  9:30 AM Heilingoetter,  Cassandra L, PA-C [facility] None  12/29/2021 10:30 AM [facility] INFUSION [facility] None  01/04/2022 11:30 AM [facility] DEVICE REMOTES [facility]  01/19/2022 10:00 AM [facility] [place] FLUSH [facility] None  01/19/2022 10:30 AM Curt Bears, MD [facility] None  01/19/2022 11:30 AM [facility] INFUSION [facility] None      LOS: 7 days

## 2021-11-10 NOTE — Progress Notes (Addendum)
Progress Note  Patient Name: Corey [name] Date of Encounter: 11/10/2021  [facility] Cardiologist: Mertie Moores, MD   Subjective   Patient noted to have slurred speech with dysarthria overnight. CT head without acute intracranial abnormalities. Had chronic left parietal lobe artifact. CTA head and neck with no evidence of large vessel occlusion. LICA with 66-59% stenosis. Had possible filling defect in the left main pulmonary artery and dedicated CTA chest recommended. Xarelto was held pending MRI head.  Doing better this morning. Still with some difficulty "getting out his words." No chest pain or palpitations. Remains in Afib with RVR HR 110-140s.  Inpatient Medications    Scheduled Meds:  acidophilus  1 capsule Oral Daily   arformoterol  15 mcg Nebulization BID   And   umeclidinium bromide  1 puff Inhalation Daily   atorvastatin  40 mg Oral QHS   budesonide  0.5 mg Nebulization BID   carbidopa-levodopa  1 tablet Oral 4 times per day   Carbinoxamine Maleate  6 mg Oral BID   Chlorhexidine Gluconate Cloth  6 each Topical Daily   feeding supplement  237 mL Oral TID BM   fenofibrate  160 mg Oral Q1400   fluticasone  2 spray Each Nare Daily   folic acid  1 mg Oral Daily   guaiFENesin  1,200 mg Oral BID   ipratropium  2 spray Each Nare q AM   lactulose  10 g Oral BID   levalbuterol  0.63 mg Nebulization BID   mouth rinse  15 mL Mouth Rinse BID   metoprolol tartrate  25 mg Oral BID   multivitamin with minerals  1 tablet Oral Daily   pantoprazole  40 mg Oral Daily   polyethylene glycol  17 g Oral BID   rivaroxaban  20 mg Oral QPM   senna-docusate  1 tablet Oral BID   sodium chloride (PF)       sodium chloride (PF)       sodium chloride flush  3 mL Intravenous Q12H   sorbitol  30 mL Oral Daily   tamsulosin  0.4 mg Oral QPC supper   topiramate  100 mg Oral BID   Continuous Infusions:  sodium chloride Stopped (11/06/21 1119)   sodium chloride Stopped (11/10/21 0152)    sodium chloride 10 mL/hr at 11/10/21 0754   amiodarone 30 mg/hr (11/10/21 0604)   ceFEPime (MAXIPIME) IV Stopped (11/10/21 0456)   diltiazem (CARDIZEM) infusion Stopped (11/07/21 1142)   metronidazole 500 mg (11/10/21 0755)   PRN Meds: sodium chloride, sodium chloride, acetaminophen **OR** acetaminophen, albuterol, ALPRAZolam, alum & mag hydroxide-simeth, benzonatate, dextromethorphan, lip balm, metoCLOPramide (REGLAN) injection, nitroGLYCERIN, sodium chloride flush   Vital Signs    Vitals:   11/10/21 0500 11/10/21 0700 11/10/21 0800 11/10/21 0817  BP: 105/71 105/78    Pulse: (!) 122 (!) 138    Resp: 20 (!) 31    Temp:   98 F (36.7 C)   TempSrc:   Oral   SpO2: 98% 96%  100%    Intake/Output Summary (Last 24 hours) at 11/10/2021 0935 Last data filed at 11/10/2021 0604 Gross per 24 hour  Intake 843.47 ml  Output 4350 ml  Net -3506.53 ml    Last 3 Weights 10/28/2021 10/23/2021 10/20/2021  Weight (lbs) 228 lb 8 oz 229 lb 12.8 oz 229 lb 1.6 oz  Weight (kg) 103.647 kg 104.237 kg 103.919 kg      Telemetry    Afib with RVR 110-140s- Personally Reviewed  ECG    Afib with HR 144- Personally Reviewed  Physical Exam   GEN: No acute distress.   Neck: No JVD Cardiac: Irregularly, irregular Respiratory: Rhonchorous GI: Soft, nontender, non-distended  MS: 1+ pitting bilateral LE edema; No deformity. Neuro:  Nonfocal  Psych: Normal affect   Labs    High Sensitivity Troponin:  No results for input(s): TROPONINIHS in the last 720 hours.   Chemistry Recent Labs  Lab 11/03/21 1208 11/04/21 0455 11/05/21 0445 11/08/21 0500 11/09/21 0445 11/10/21 0050 11/10/21 0316  NA 128* 128*   < > 128* 128* 129* 129*  K 4.0 3.6   < > 3.5 3.7 3.4* 3.5  CL 97* 98   < > 95* 95* 93* 94*  CO2 22 23   < > 25 26 27 28   GLUCOSE 98 86   < > 118* 119* 113* 111*  BUN 17 12   < > 9 9 10 10   CREATININE 0.91 0.68   < > 0.76 0.75 0.78 0.83  CALCIUM 8.8* 8.4*   < > 8.4* 8.4* 8.5* 8.5*  MG 1.8 1.9    < > 2.0 1.7  --  1.6*  PROT 6.7 5.8*  --   --   --  6.0*  --   ALBUMIN 2.7* 2.3*  --   --   --  2.7*  --   AST 25 21  --   --   --  24  --   ALT 18 7  --   --   --  5  --   ALKPHOS 45 40  --   --   --  49  --   BILITOT 0.8 1.2  --   --   --  0.7  --   GFRNONAA >60 >60   < > >60 >60 >60 >60  ANIONGAP 9 7   < > 8 7 9 7    < > = values in this interval not displayed.     Lipids No results for input(s): CHOL, TRIG, HDL, LABVLDL, LDLCALC, CHOLHDL in the last 168 hours.  Hematology Recent Labs  Lab 11/09/21 0445 11/10/21 0050 11/10/21 0316  WBC 6.4 7.4 7.6  RBC 2.44* 2.46* 2.38*  HGB 8.2* 8.4* 8.0*  HCT 25.3* 25.1* 24.4*  MCV 103.7* 102.0* 102.5*  MCH 33.6 34.1* 33.6  MCHC 32.4 33.5 32.8  RDW 14.6 14.8 14.8  PLT 65* 72* 71*    Thyroid No results for input(s): TSH, FREET4 in the last 168 hours.  BNP Recent Labs  Lab 11/03/21 1208  BNP 187.8*     DDimer No results for input(s): DDIMER in the last 168 hours.   Radiology    CT HEAD CODE STROKE WO CONTRAST  Result Date: 11/10/2021 CLINICAL DATA:  Code stroke. Initial evaluation for neuro deficit, stroke suspected. EXAM: CT HEAD WITHOUT CONTRAST TECHNIQUE: Contiguous axial images were obtained from the base of the skull through the vertex without intravenous contrast. RADIATION DOSE REDUCTION: This exam was performed according to the departmental dose-optimization program which includes automated exposure control, adjustment of the mA and/or kV according to patient size and/or use of iterative reconstruction technique. COMPARISON:  CT from 07/06/2012. FINDINGS: Brain: Generalized age-related cerebral atrophy with chronic microvascular ischemic disease. Remote posterior left MCA distribution infarct involving the left parietal lobe noted. No acute intracranial hemorrhage. No acute large vessel territory infarct. No mass lesion or midline shift. No hydrocephalus or extra-axial fluid collection. Vascular: No hyperdense vessel. Scattered  vascular calcifications noted  within the carotid siphons. Skull: Scalp soft tissues and calvarium within normal limits. Sinuses/Orbits: Globes and orbital soft tissues demonstrate no acute finding. Paranasal sinuses and mastoid air cells are clear. Other: None. ASPECTS [facility] Stroke Program Early CT Score) - Ganglionic level infarction (caudate, lentiform nuclei, internal capsule, insula, M1-M3 cortex): 7 - Supraganglionic infarction (M4-M6 cortex): 3 Total score (0-10 with 10 being normal): 10 IMPRESSION: 1. No acute intracranial abnormality. 2. ASPECTS is 10. 3. Chronic left parietal lobe infarct, posterior left MCA distribution. 4. Underlying age-related cerebral atrophy with chronic microvascular ischemic disease. These results were communicated to Dr. Sabino Gasser At 12:12 am on 11/10/2021 by text page via the [facility] messaging system. Electronically Signed   By: Jeannine Boga M.D.   On: 11/10/2021 00:13   CT ANGIO HEAD NECK W WO CM W PERF (CODE STROKE)  Result Date: 11/10/2021 CLINICAL DATA:  Initial evaluation for neuro deficit, stroke. EXAM: CT ANGIOGRAPHY HEAD AND NECK TECHNIQUE: Multidetector CT imaging of the head and neck was performed using the standard protocol during bolus administration of intravenous contrast. Multiplanar CT image reconstructions and MIPs were obtained to evaluate the vascular anatomy. Carotid stenosis measurements (when applicable) are obtained utilizing NASCET criteria, using the distal internal carotid diameter as the denominator. RADIATION DOSE REDUCTION: This exam was performed according to the departmental dose-optimization program which includes automated exposure control, adjustment of the mA and/or kV according to patient size and/or use of iterative reconstruction technique. CONTRAST:  121mL OMNIPAQUE IOHEXOL 350 MG/ML SOLN COMPARISON:  Prior head CT from earlier the same day. FINDINGS: CTA NECK FINDINGS Aortic arch: Visualized aortic arch normal in caliber with normal  branch pattern. Mild-to-moderate atheromatous change about the arch and origin of the great vessels without hemodynamically significant stenosis. Right carotid system: Right CCA patent from its origin to the bifurcation without stenosis. Moderate atheromatous plaque about the right carotid bulb/proximal right ICA without significant stenosis. Right ICA patent distally without stenosis or dissection. Left carotid system: Left CCA patent from its origin to the bifurcation without stenosis. Prominent atheromatous plaque about the left carotid bulb/proximal left ICA with associated stenosis of up to approximately 70-80% by NASCET criteria (series 9, image 210). Exact measurements difficult given motion and streak artifact through this region. Left ICA tortuous but otherwise patent distally to the skull base. Vertebral arteries: Both vertebral arteries arise from the subclavian arteries. No significant proximal subclavian artery stenosis. Atheromatous change about the origins and proximal V1 segments bilaterally with associated moderate up to approximate 50% stenoses. Vertebral arteries otherwise patent distally without stenosis or dissection. Skeleton: No discrete or worrisome osseous lesions. Prior fusion at C5-C6. Other neck: No other acute soft tissue abnormality within the neck. Upper chest: Question possible filling defects involving the left main pulmonary artery, which could reflect acute pulmonary embolus (series 9, image 396). Finding not entirely certain given timing of the contrast bolus on this exam. Consolidative opacity noted within the visualized left lung similar to previous. Bilateral pleural effusions partially visualized. Degree of underlying pulmonary interstitial edema noted. Review of the MIP images confirms the above findings CTA HEAD FINDINGS Anterior circulation: Examination somewhat limited by timing the contrast bolus. Petrous segments patent bilaterally. Atheromatous change within the carotid  siphons with associated mild to moderate multifocal narrowing. A1 segments patent bilaterally. Normal anterior communicating artery complex. Anterior cerebral arteries perfused to their distal aspects without visible stenosis. No M1 stenosis or occlusion. Normal MCA bifurcations. Distal MCA branches perfused and symmetric. Posterior circulation: Both V4 segments patent  without stenosis. Left PICA patent. Right PICA not definitely seen. Basilar patent to its distal aspect without stenosis. Superior cerebellar arteries patent bilaterally. Both PCAs primarily supplied via the basilar well perfused or distal aspects. Venous sinuses: Patent allowing for timing the contrast bolus. Anatomic variants: None significant.  No visible aneurysm. Review of the MIP images confirms the above findings IMPRESSION: 1. Negative CTA for large vessel occlusion. 2. 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 3. Atheromatous change about the origins of both vertebral arteries with associated moderate multifocal stenoses. 4. Question filling defects involving the left main pulmonary artery, suspicious for acute pulmonary emboli. Further evaluation with dedicated cross-sectional imaging of the chest recommended. 5. Consolidative opacity within the visualized left lung, similar to prior CT from 10/15/2021. 6. Layering bilateral pleural effusions with underlying pulmonary interstitial edema, partially visualized. Critical Value/emergent results were called by telephone at the time of interpretation on 11/10/2021 at 3:23 am to the Nurse practitioner taking care of the patient, Quinn Plowman , who verbally acknowledged these results. Electronically Signed   By: Jeannine Boga M.D.   On: 11/10/2021 03:24    Cardiac Studies   2D echo 08/2021  1. Left ventricular ejection fraction, by estimation, is 65 to 70%. The  left ventricle has normal  function. The left ventricle has no regional  wall motion abnormalities. There is mild left ventricular hypertrophy.  Left ventricular diastolic parameters  are indeterminate.   2. Right ventricular systolic function mild to moderately reduced. The  right ventricular size is mildly enlarged. There is normal pulmonary  artery systolic pressure.   3. Trivial mitral valve regurgitation.   4. The aortic valve is tricuspid. Aortic valve regurgitation is not  visualized. Mild aortic valve sclerosis is present, with no evidence of  aortic valve stenosis.   5. Aortic dilatation noted. There is mild dilatation of the ascending  aorta, measuring 40 mm.   6. The inferior vena cava is normal in size with greater than 50%  respiratory variability, suggesting right atrial pressure of 3 mmHg.   Patient Profile     76 y.o. male with CAD s/p remote PCIs of RCA 2004 and 2007 with otherwise mild nonbstructive CAD, stage 4 lung CA, CVA, paroxysmal atrial fibrillation on Tikosyn, ILR in place, QT prolongation, HTN, HLD, prostate CA, OSA on CPAP, mild carotid artery disease (4-82% RICA, 50-03% LICA by duplex 70/4888), prior anemia requiring transfusions (felt chemo related), mild dilation of ascending aorta who presented with weakness, cough and DOE found to have possible CAP and afib with RVR for which cardiology has been consulted.   Assessment & Plan    #Concern for Stroke vs TIA: Patient with episode of expressive aphasia overnight with concern for acute stroke vs TIA. CT head without acute abnormality. CTA head and neck with 91-69% LICA but no large vessel occlusion. MRI head pending. Symptoms have since resolved. AC held pending MRI results.  -Awaiting MRI head -AC held per neuro -Recommended for vascular evaluation for 45-03% LICA  #Possible PE noted on CTA: Noted to have possible filling defect on CTA head and neck that was obtained due to expressive aphasia as above. -Will need dedicated CTA chest  once able -[facility] held as above; resume once cleared from Neuro standpoint  #Atrial fibrillation with RVR Recent ILR with 0% Afib and thus suspect RVR now driven by acute ilness. Tikosyn stopped this admission due to prolonged QT and  hyponatremia. Was placed on amiodarone bolus +gtt and then temporarily on PO amiodarone but developed recurrent RVR and therefore amiodarone gtt was restarted. AC has been held due to acute stroke. Rate control limited due to hypotension. Can consider DCCV in the future if remains in Afib, however, given acute illness, he is unlikely to maintain rhythm at this time. -Continue amiodarone gtt for now -Will do trial of IV dig load given persistent RVR -AC held per Neuro pending MRI given concerns for acute stroke  #Possible CAP #Lung cancer stage IV On cefepime for planned 8 day course. Agree with palliative care assessment. -Management per critical care  #Hyponatremia and pancytopenia: Likely related to underlying malignancy.  -Management per primary  #Suspected Diastolic HF: #RV Dysfunction: #LE edema: Agree that low albumin likely contributing to third spacing. Also may be related to mild CHF in the setting of Afib with RVR. Intermittently dosing albumin and lasix. -Continue judicious diuresis as needed -Compression socks    For questions or updates, please contact [place] Please consult [email or web address] for contact info under        Signed, Freada Bergeron, MD  11/10/2021, 9:35 AM

## 2021-11-10 NOTE — Progress Notes (Signed)
Progress Note   Patient: Corey [name] AJG:[id number] DOB: Sep 09, 1946 DOA: 11/03/2021     7 DOS: the patient was seen and examined on 11/10/2021   Brief hospital course: Corey [name] is a 76 y.o. male with PMH stage IV NSCLC with liver mets, PAF, CAD, chronic low back p ain, anemia, anxiety, osteoarthritis, HLD, HTN, GERD, OSA on CPAP, prostate cancer who presented with approximately 4 days of progressively worsening weakness and ongoing productive cough of yellow sputum with associated pleuritic chest pain, malaise, dyspnea.  He recently deceived chemotherapy within the past week as well.  Due to continued symptoms of feeling poorly, he presented for further evaluation. On evaluation in the ER, he underwent CXR which showed ongoing extensive irregular opacities involving the left lung not significantly changed from prior imaging.  Prior CT chest on 10/15/2021 showed a stable dense consolidation in the lingula and left lower lobe. Notable labs included WBC 2.8, platelet count 158.  Sodium 128.  BNP 187. There was concern for possible pneumonia and he was started on vancomycin and cefepime. He also developed A. fib with RVR and was started on a Cardizem drip.  Assessment and Plan: * Atrial fibrillation with RVR ([place])- (present on admission) - on Tikosyn, Toprol, and Xarelto at home - Patient noted to be in A. fib with RVR on admission - s/p cardizem drip on admission but BP low/normal with prolonged QTc - cardiology consulted for further assistance given complexity of med regimen and prolonged QTc - continue amio per cardiology. Also started on digoxin trial on 2/7. Dilt drip appears to be discontinued in the room and no PO ordered (will have nursing clarify with cardiology) - may need consideration of DCCV but unfortunately does have multiple chronic issues notably cancer; palliative care has been consulted as well  - follow up cardiology rec's and changes  Dysarthria - acutely happened overnight  of 2/6 with concern for a stroke. 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% left ICA stenosis which may or may not need outpatient follow up pending clinical course but suspect we are heading in a hospice direction at this point -Other considered etiology for this may be due to the overall progressive decline from his malignancy - no obvious stroke on [facility]; does have chronic left parietal lobe infarct noted -Follow-up MRI brain and if no obvious contraindication, we will resume Xarelto today  CAP (community acquired pneumonia)- (present on admission) - ongoing LLL consolidation which may be acutely worse on admission; symptomatically he felt worse over the past ~1 week prior to admission - s/p vanc and cefepime on admission; MRSA screen negative, okay to d/c vanc and continue monotherapy cefepime at this time - complete 8 day cefepime course and stop - flagyl added 2/6 and will treat x 5 days per pulm - follow up cultures (blood cultures remain negative)  Prolonged Q-T interval on ECG - tikosyn now officially discontinued per cardiology - continue monitoring electrolytes; goal K>4, Mg>2  Hyponatremia- (present on admission) - appeared clinically volume depleted on admission - unfortunately edema was a big issue in LE; given hypoalbuminemia he is likely 3rd spacing. FeNa calculated <<1% on 11/06/21 consistent with intravascular volume depletion and worsening hyponatremia at that time - ideally needs intravascular repletion but will need to correct some of albumin first in efforts to maximize volume status; initially started NS on 2/3 but will stop this and give albumin first and assess response (hopefully this will be enough to shift volume back intravascularly without need for  IVF) - Na has responded well to intermittent doses of albumin and lasix; cardiology also giving this intermittently  - caution with diuresis too as his oral intake is poor now - trend BMP   Non-small cell carcinoma of left lung,  stage 4 ([place])- (present on admission) - follows outpatient with Dr. [name] - given his difficulty with recovery currently, have consulted palliative care for help with discussions as well - it does not appear that he is tolerating chemo especially this last round and patient/family already considering no further chemo but that leaves the question of "what next" then. They seem to understand the trend in place may lead to hospice but we are still trying to treat what we can at the same time too - follow up further discussions with palliative care once able to establish   Stasis dermatitis - more prominent on LLE; no overt cellulitis signs as nontender as well - B/L LE duplex negative for DVT as well, performed 11/06/21 - likely caused by ongoing LE edema - some improvement in LE edema with lasix as well; will try and apply TED hose soon too   Leukopenia- (present on admission) - likely multifactorial from underlying infection plus recent chemo   Macrocytic anemia- (present on admission) - no overt bleeding - downtrend suspected in setting of chemo recently   OSA on CPAP - continue nightly CPAP  GERD (gastroesophageal reflux disease)- (present on admission) - continue PPI  Dyslipidemia- (present on admission) - Continue Lipitor  Benign essential HTN- (present on admission) - BP soft after cardizem drip started - continue monitoring   Coronary artery disease- (present on admission) - Continue Lipitor, BB -On Xarelto for underlying A. fib  Sepsis (HCC)-resolved as of 11/08/2021, (present on admission) - neutropenia, tachycardia, tachypnea, lung source suspected - see pna treatment      Subjective:  Wife and children present this morning.  He has met with multiple disciplines at this point including pulmonology, oncology, neurology. Underwent extensive work-up overnight due to acute onset of dysarthria and difficulty with his thought process.  No obvious stroke appreciated but  he will undergo MRI today. Symptoms were improving for the most part when seen this morning but still having difficulty with his thought process.   Physical Exam: Vitals:   11/10/21 1000 11/10/21 1100 11/10/21 1200 11/10/21 1208  BP: 99/76 117/73 135/71   Pulse: (!) 35 (!) 143 (!) 141   Resp: (!) 29 (!) 26 (!) 25   Temp:      TempSrc:      SpO2: (!) 83% 100% 98% 98%  Physical Exam Constitutional:      General: He is not in acute distress.    Appearance: Normal appearance.     Comments: Lethargic appearing  HENT:     Head: Normocephalic and atraumatic.     Mouth/Throat:     Mouth: Mucous membranes are moist.  Eyes:     Extraocular Movements: Extraocular movements intact.  Cardiovascular:     Rate and Rhythm: Tachycardia present. Rhythm irregular.     Heart sounds: Normal heart sounds.  Pulmonary:     Effort: Pulmonary effort is normal.     Breath sounds: No wheezing.     Comments: Coarse breath sounds bilaterally but slowly improving Abdominal:     General: Bowel sounds are normal. There is no distension.     Palpations: Abdomen is soft.     Tenderness: There is no abdominal tenderness.  Musculoskeletal:  General: Normal range of motion.     Cervical back: Normal range of motion and neck supple.     Comments: 2+ B/L LE edema with some mild improvement. Mild LLE erythema, no tenderness  Skin:    General: Skin is warm and dry.  Neurological:     General: No focal deficit present.     Mental Status: He is alert.  Psychiatric:        Mood and Affect: Mood normal.        Behavior: Behavior normal.    Data Reviewed:  I have Reviewed nursing notes, Vitals, and Lab results since pt's last encounter. Pertinent lab results WBC 7.6, Hgb 8 g/dL Na 129 I have ordered test including BMP, CBC I have reviewed the last note from staff over past 24 hours,  I have discussed pt's care plan and test results with nursing staff, CM.   Family Communication:    Code Status: Full  Code   Disposition: Status is: Inpatient Remains inpatient appropriate because: Treatment as outlined in A&P    Planned Discharge Destination: Home  DVT ppx: Xarelto     Author: Dwyane Dee, MD 11/10/2021 1:37 PM  For on call review [email or web address].

## 2021-11-10 NOTE — Progress Notes (Signed)
During head to toe assessment, patient admits to feeling sad and helpless, emotional support offered.

## 2021-11-10 NOTE — Progress Notes (Signed)
Palliative care brief progress note  Palliative care consult received.  Chart reviewed and I met today briefly with Mr. Vanecek and his wife.  They requested to set up a family meeting for tomorrow around 50.  Full consult to follow.  Micheline Rough, MD [place] Palliative Medicine Team [phone]  NO CHARGE NOTE

## 2021-11-10 NOTE — Consult Note (Signed)
NAME:  Corey [name], MRN:  [id number], DOB:  [birth year], LOS: 7 ADMISSION DATE:  11/03/2021, CONSULTATION DATE:  11/10/21 REFERRING MD:  Tenny Craw, CHIEF COMPLAINT:  Shortness of breath   History of Present Illness:  Corey [name] is a 76 y.o. 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  Cultures have been negative, legionella and strep pneumo also negative.   He was initially started on Vanc and Cefepime, MRSA negative so continued on Cefepime.  PCCM consulted in this setting.    Since admission pt has been intermittently in Atrial fibrillation with RVR despite Cardizem, cardiology evaluated and suspect secondary to acute illness.   Overnight on  2/6, patient was noted to have slurred speech and dysarthria and code code stroke was called.   Imaging revealed no acute infarcts or evidence of LVO.   This morning is back to neurologic baseline.   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   Significant  Hospital Events: Including procedures, antibiotic start and stop dates in addition to other pertinent events   1/31 Admitted to hospital medicine service with weakness and CAP  2/6 concern for acute stroke, imaging negative 2/7 PCCM consult  Interim History / Subjective:   Pt reports that he is feeling better this morning, thinks his speech becomes slurred when he is very fatigued. On 2L [place].  No chest pain Palliative care has been consulted.  Question of possible filling defect L main pulmonary artery on CTA  Objective   Blood pressure 105/78, pulse (!) 138, temperature 98.6 F (37 C), temperature source Axillary, resp. rate (!) 31, SpO2 96 %.        Intake/Output Summary (Last 24 hours) at 11/10/2021 0813 Last data filed at 11/10/2021 0604 Gross per 24 hour  Intake 843.47 ml  Output 4350 ml  Net -3506.53 ml   There were no vitals filed for this visit.  General:  fatigued elderly gentleman, awake, conversational and in no acute distress HEENT: MM pink/dry, pupils equal, sclera anicteric Neuro: awake, oriented, speech clear, no facial droop or pronator drift, moving all extremities with equal strength throughout CV: s1s2 rrr, no m/r/g PULM:  decreased air entry LLL, on 2L [employer] without wheezing or rhonchi, no retractions  GI: soft, bsx4 active  Extremities: warm/dry, no edema, erythematous  LLE, non-tender Skin: no rashes or lesions   Resolved Hospital Problem list     Assessment & Plan:    Acute Hypoxic Respiratory Failure  Extensive irregular opacities L lung, likely CAP Possible PE Stage IV non-small cell lung ca 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 Valeta Harms  Follows with Dr. [name] and treated with Alimta and Beryle Flock,  has struggled with side effects of chemo Respiratory and blood cultures have been negative  -concern for possible L main pulmonary artery filling defect on CTA obtained during code stroke last night, however pt  has been on Xarelto though held in the setting of possible acute CTA.    LLE doppler negative for DVT and no increased O2 requirement, so low suspicion for significant acute PE, would resume Xarelto as soon as cleared by Neurology -start Flagyl for possible post-obstructive PNA -CRP elevated, procal indeterminate at 0.27 (0.33 on admission), start slow Prednisone taper with  40mg  daily x7 days and then decrease by 10mg  every week -palliative care has been consulted -rest of plan per primary team       Best Practice (right click and "Reselect all SmartList Selections" daily)   Per primary  Labs   CBC: Recent Labs  Lab 11/06/21 0630 11/07/21 0237 11/08/21 0500 11/09/21 0445 11/10/21 0050 11/10/21 0316  WBC 2.6* 2.1* 3.4* 6.4 7.4 7.6  NEUTROABS 1.4* 1.4* 2.5 4.7  --  5.6  HGB 9.0* 8.9* 7.6* 8.2* 8.4* 8.0*  HCT 27.0* 27.1* 23.3* 25.3* 25.1* 24.4*  MCV 102.3* 101.9* 103.6* 103.7* 102.0* 102.5*  PLT 93* 66* 58* 65* 72* 71*    Basic Metabolic Panel: Recent Labs  Lab 11/06/21 0630 11/07/21 0237 11/07/21 1609 11/08/21 0500 11/09/21 0445 11/10/21 0050 11/10/21 0316  NA 124* 123* 126* 128* 128* 129* 129*  K 4.1 3.7 3.5 3.5 3.7 3.4* 3.5  CL 96* 93* 95* 95* 95* 93* 94*  CO2 21* 23 24 25 26 27 28   GLUCOSE 123* 118* 127* 118* 119* 113* 111*  BUN 14 10 10 9 9 10 10   CREATININE 0.72 0.59* 0.68 0.76 0.75 0.78 0.83  CALCIUM 8.4* 8.1* 8.1* 8.4* 8.4* 8.5* 8.5*  MG 1.8 1.8  --  2.0 1.7  --  1.6*   GFR: Estimated Creatinine Clearance: 98.7 mL/min (by C-G formula based on SCr of 0.83 mg/dL). Recent Labs  Lab 11/03/21 1208 11/03/21 1229 11/03/21 1515 11/04/21 0455 11/08/21 0500 11/09/21 0445 11/10/21 0050 11/10/21 0316  PROCALCITON 0.33  --   --   --   --   --   --  0.27  WBC 2.8*  --   --    < > 3.4* 6.4 7.4 7.6  LATICACIDVEN  --  1.1 0.9  --   --   --   --   --    < > = values in this interval not displayed.    Liver Function Tests: Recent Labs  Lab 11/03/21 1208  11/04/21 0455 11/10/21 0050  AST 25 21 24   ALT 18 7 5   ALKPHOS 45 40 49  BILITOT 0.8 1.2 0.7  PROT 6.7 5.8* 6.0*  ALBUMIN 2.7* 2.3* 2.7*   No results for input(s): LIPASE, AMYLASE in the last 168 hours. No results for input(s): AMMONIA in the last 168 hours.  ABG    Component Value Date/Time   PHART 7.411 08/01/2021 1530   PCO2ART 31.9 (L) 08/01/2021 1530   PO2ART 114 (H) 08/01/2021 1530   HCO3 19.9 (L) 08/01/2021 1530   TCO2 26 10/31/2012 1253   ACIDBASEDEF 3.7 (H) 08/01/2021 1530   O2SAT 99.0 08/01/2021 1530     Coagulation Profile: Recent Labs  Lab 11/03/21 1229 11/10/21 0050  INR 1.7* 2.6*    Cardiac Enzymes: No results for input(s): CKTOTAL, CKMB, CKMBINDEX, TROPONINI in the last 168 hours.  HbA1C: Hgb A1c MFr Bld  Date/Time Value Ref Range  Status  07/07/2012 05:30 AM 5.9 (H) <5.7 % Final    Comment:    (NOTE)                                                                       According to the ADA Clinical Practice Recommendations for 2011, when HbA1c is used as a screening test:  >=6.5%   Diagnostic of Diabetes Mellitus           (if abnormal result is confirmed) 5.7-6.4%   Increased risk of developing Diabetes Mellitus References:Diagnosis and Classification of Diabetes Mellitus,Diabetes Care,2011,34(Suppl 1):S62-S69 and Standards of Medical Care in         Diabetes - 2011,Diabetes WCHE,5277,82 (Suppl 1):S11-S61.  07/06/2012 12:24 AM 6.0 (H) <5.7 % Final    Comment:    (NOTE)                                                                       According to the ADA Clinical Practice Recommendations for 2011, when HbA1c is used as a screening test:  >=6.5%   Diagnostic of Diabetes Mellitus           (if abnormal result is confirmed) 5.7-6.4%   Increased risk of developing Diabetes Mellitus References:Diagnosis and Classification of Diabetes Mellitus,Diabetes UMPN,3614,43(XVQMG 1):S62-S69 and Standards of Medical Care in         Diabetes -  2011,Diabetes QQPY,1950,93 (Suppl 1):S11-S61.    CBG: Recent Labs  Lab 11/03/21 1019 11/10/21 0037  GLUCAP 102* 105*    Review of Systems:   Please see the history of present illness. 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   Surgical History:   Past Surgical History:  Procedure Laterality Date   ADENOIDECTOMY     ANTERIOR CERVICAL DECOMP/DISCECTOMY FUSION  07/2001; 10/2002   "C5-6; C6-7; redo"   BACK SURGERY     CARPAL TUNNEL RELEASE Left 10/2015   CHEST TUBE INSERTION Left 03/17/2021   Procedure: INSERTION PLEURAL DRAINAGE CATHETER;  Surgeon: Garner Nash, DO;  Location: [facility];  Service: Pulmonary;  Laterality: Left;  Indwelling Tunneled Pleural catheter (PLEUREX)    COLONOSCOPY W/ POLYPECTOMY  02/2014   CORONARY ANGIOPLASTY WITH STENT PLACEMENT  05/2003; 12/2005   "mid RCA; mid RCA"   FINE NEEDLE ASPIRATION  02/26/2021   Procedure: FINE NEEDLE ASPIRATION (FNA) LINEAR;  Surgeon: Ina Homes  C, MD;  Location: [facility];  Service: Pulmonary;;   HAND SURGERY  02/2019   LEFT HAND   implantable loop recorder placement  02/27/2020   [employer] Reveal Beason model  [id number]  implantable loop recorder implanted by Dr Rayann Heman for afib management and evaluation of presyncope   IR IMAGING GUIDED PORT INSERTION  04/16/2021   JOINT REPLACEMENT     KNEE ARTHROSCOPY Left 10/2005   KNEE ARTHROSCOPY W/ PARTIAL MEDIAL MENISCECTOMY Left 09/2005   LUMBAR LAMINECTOMY/DECOMPRESSION MICRODISCECTOMY  03/2005   "L4-5"   POSTERIOR LUMBAR FUSION  10/2003   L5-S1; "plates, screws"   SHOULDER ARTHROSCOPY Right 08/2011   Debridement of labrum, arthroscopic distal clavicle excision   SHOULDER OPEN ROTATOR CUFF REPAIR Left 07/2014   TEE WITHOUT CARDIOVERSION  07/07/2012   Procedure: TRANSESOPHAGEAL ECHOCARDIOGRAM (TEE);  Surgeon: Fay Records, MD;  [facility];  Service: Cardiovascular;  Laterality: N/A;   THORACENTESIS N/A 02/25/2021   Procedure: Mathews Robinsons;  Surgeon: Juanito Doom, MD;  Location: [facility];  Service: Cardiopulmonary;  Laterality: N/A;   TONSILLECTOMY AND ADENOIDECTOMY  ~ 1956   TOTAL KNEE ARTHROPLASTY Left 10/2006   TRIGGER FINGER RELEASE Left 10/2015   VIDEO BRONCHOSCOPY WITH ENDOBRONCHIAL ULTRASOUND Left 02/26/2021   Procedure: VIDEO BRONCHOSCOPY WITH ENDOBRONCHIAL ULTRASOUND;  Surgeon: Candee Furbish, MD;  Location: [facility] ENDOSCOPY;  Service: Pulmonary;  Laterality: Left;  cryoprobe too thanks!     Social History:   reports that he quit smoking about 18 years ago. His smoking use included cigarettes. He has a 34.00 pack-year smoking history. He has never been exposed to tobacco smoke. He has never used smokeless tobacco. He reports that he does not drink alcohol and does not use drugs.   Family History:  His family history includes Blindness in his son; Breast cancer in his paternal grandmother; Heart attack in his father and paternal grandfather; Heart disease in his father; Hypertension in his mother; Lung cancer in his paternal aunt; Parkinson's disease in his brother; Prostate cancer (age of onset: 52) in his brother. There is no  history of Colon cancer or Pancreatic cancer.   Allergies Allergies  Allergen Reactions   Amoxicillin Anaphylaxis and Swelling    Tolerates Rocephin   Fosinopril Other (See Comments) and Cough    Muscle aches   Hydromorphone Nausea And Vomiting   Ampicillin Rash   Codeine Nausea Only and Other (See Comments)    Heavy amounts cause nausea and stomach upset   Monopril [Fosinopril Sodium] Other (See Comments)    Muscle aches & pain   Rosuvastatin Other (See Comments)    Joint pain     Home Medications  Prior to Admission medications   Medication Sig Start Date End Date Taking? Authorizing Provider  acetaminophen (TYLENOL) 325 MG tablet Take 2 tablets (650 mg total) by mouth every 6 (six) hours as needed for mild pain (or Fever >/= 101). 08/25/21  Yes Angiulli, Lavon Paganini, PA-C  albuterol (PROVENTIL) (2.5 MG/3ML) 0.083% nebulizer solution Inhale 3 mLs into the lungs every 4 (four) hours as needed for wheezing or shortness of breath. Patient taking differently: Inhale 3 mLs into the lungs See admin instructions. Nebulize 2.5 mg & inhale into the lungs 3-4 times a day and may also use an additional 2.5 mg up to two times a day as needed for shortness of breath 08/28/21  Yes Angiulli, Lavon Paganini, PA-C  ALPRAZolam Duanne Moron) 0.5 MG tablet Take 1 tablet (0.5 mg total) by mouth at bedtime  as needed for anxiety. Patient taking differently: Take 0.5 mg by mouth at bedtime as needed for anxiety or sleep. 08/25/21  Yes Angiulli, Lavon Paganini, PA-C  atorvastatin (LIPITOR) 40 MG tablet Take 1 tablet (40 mg total) by mouth daily. Patient taking differently: Take 40 mg by mouth at bedtime. 08/25/21  Yes Angiulli, Lavon Paganini, PA-C  benzonatate (TESSALON) 200 MG capsule Take 1 capsule (200 mg total) by mouth 3 (three) times daily. Patient taking differently: Take 200 mg by mouth in the morning, at noon, and at bedtime. 10/07/21  Yes Heilingoetter, Cassandra L, PA-C  budesonide (PULMICORT) 0.5 MG/2ML nebulizer solution  Take 2 mLs (0.5 mg total) by nebulization 2 (two) times daily. Patient taking differently: Take 0.5 mg by nebulization in the morning and at bedtime. 09/25/21  Yes Icard, Bradley L, DO  carbidopa-levodopa (SINEMET IR) 25-100 MG tablet Take 1 tablet by mouth 3 (three) times daily. Patient taking differently: Take 1 tablet by mouth See admin instructions. Take 1 tablet by mouth at 8 AM, 12 PM, 4 PM, and bedtime 07/09/21  Yes Tat, Eustace Quail, DO  chlorpheniramine-HYDROcodone (TUSSIONEX) 10-8 MG/5ML SUER Take 5 mLs by mouth every 12 (twelve) hours as needed for cough. Patient taking differently: Take 5 mLs by mouth at bedtime. 08/25/21  Yes Angiulli, Lavon Paganini, PA-C  dofetilide (TIKOSYN) 500 MCG capsule Take 1 capsule (500 mcg total) by mouth 2 (two) times daily. Please keep upcoming appointment in February for future refills. Thank you Patient taking differently: Take 500 mcg by mouth 2 (two) times daily. 08/25/21  Yes Angiulli, Lavon Paganini, PA-C  fenofibrate 160 MG tablet TAKE 1 TABLET DAILY. PLEASE KEEP UPCOMING APPT IN MARCH WITH DR. Acie Fredrickson BEFORE ANYMORE REFILLS. Patient taking differently: Take 160 mg by mouth See admin instructions. Take 160 mg by mouth at 2 PM daily 08/25/21  Yes Angiulli, Lavon Paganini, PA-C  fluticasone (FLONASE) 50 MCG/ACT nasal spray Place 2 sprays into both nostrils daily. 08/25/21  Yes Angiulli, Lavon Paganini, PA-C  folic acid (FOLVITE) 1 MG tablet Take 1 tablet (1 mg total) by mouth daily. 08/25/21  Yes Angiulli, Lavon Paganini, PA-C  guaiFENesin (MUCINEX) 600 MG 12 hr tablet Take 2 tablets (1,200 mg total) by mouth 2 (two) times daily. 08/12/21  Yes Kathie Dike, MD  ipratropium (ATROVENT) 0.03 % nasal spray Place 1-2 sprays into both nostrils every 12 (twelve) hours. For drainage Patient taking differently: Place 2 sprays into both nostrils in the morning. 08/28/21  Yes Angiulli, Lavon Paganini, PA-C  levalbuterol (XOPENEX) 0.63 MG/3ML nebulizer solution Take 3 mLs (0.63 mg total) by nebulization  2 (two) times daily. 09/25/21  Yes Icard, Octavio Graves, DO  loratadine (CLARITIN) 10 MG tablet Take 1 tablet (10 mg total) by mouth daily as needed for allergies (nasal drainage). Patient taking differently: Take 10 mg by mouth daily. 08/25/21  Yes Angiulli, Lavon Paganini, PA-C  magnesium oxide (MAG-OX) 400 (240 Mg) MG tablet Take 1 tablet (400 mg total) by mouth daily. 08/25/21  Yes Angiulli, Lavon Paganini, PA-C  metoprolol succinate (TOPROL-XL) 25 MG 24 hr tablet Take 1 tablet (25 mg total) by mouth daily. 08/25/21  Yes Angiulli, Lavon Paganini, PA-C  Multiple Vitamins-Minerals (MULTIVITAMINS THER. W/MINERALS) TABS Take 1 tablet by mouth daily with breakfast.   Yes [provider]  nitroGLYCERIN (NITROSTAT) 0.4 MG SL tablet Place 1 tablet (0.4 mg total) under the tongue every 5 (five) minutes as needed for chest pain. 08/25/21  Yes Angiulli, Lavon Paganini, PA-C  omeprazole (PRILOSEC) 20  MG capsule Take 1 capsule (20 mg total) by mouth daily. Patient taking differently: Take 20 mg by mouth daily before breakfast. 08/25/21  Yes Angiulli, Lavon Paganini, PA-C  ondansetron (ZOFRAN) 8 MG tablet Take 8 mg by mouth every 8 (eight) hours as needed for nausea or vomiting.   Yes [provider]  polyethylene glycol (MIRALAX / GLYCOLAX) 17 g packet Take 17 g by mouth daily. Patient taking differently: Take 17 g by mouth daily as needed for mild constipation (MIX AS DIRECTED AND DRINK). 08/25/21  Yes Angiulli, Lavon Paganini, PA-C  potassium chloride (KLOR-CON) 10 MEQ tablet TAKE 1 TABLET BY MOUTH EVERY DAY Patient taking differently: Take 10 mEq by mouth See admin instructions. Take 10 mEq by mouth at 2 PM daily 07/16/21  Yes Nahser, Wonda Cheng, MD  rivaroxaban (XARELTO) 20 MG TABS tablet Take 1 tablet (20 mg total) by mouth daily. Patient taking differently: Take 20 mg by mouth every evening. 08/25/21  Yes Angiulli, Daniel J, PA-C  RYVENT 6 MG TABS TAKE 1 TABLET BY MOUTH 2 (TWO) TIMES DAILY AS NEEDED (DRAINAGE). Patient taking  differently: Take 6 mg by mouth in the morning and at bedtime. 10/29/21  Yes Garnet Sierras, DO  Saccharomyces boulardii (PROBIOTIC) 250 MG CAPS Take 250 mg by mouth daily.   Yes [provider]  tamsulosin (FLOMAX) 0.4 MG CAPS capsule Take 1 capsule (0.4 mg total) by mouth daily after supper. 08/25/21  Yes Angiulli, Lavon Paganini, PA-C  Tiotropium Bromide-Olodaterol (STIOLTO RESPIMAT) 2.5-2.5 MCG/ACT AERS Inhale 2 puffs into the lungs daily. 09/25/21  Yes Icard, Bradley L, DO  topiramate (TOPAMAX) 50 MG tablet Take 2 tablets (100 mg total) by mouth 2 (two) times daily. 08/25/21  Yes Angiulli, Lavon Paganini, PA-C  traMADol (ULTRAM) 50 MG tablet Take 1 tablet (50 mg total) by mouth every 6 (six) hours as needed for moderate pain. 08/25/21  Yes Angiulli, Lavon Paganini, PA-C  Vitamin D, Ergocalciferol, (DRISDOL) 1.25 MG (50000 UNIT) CAPS capsule Take 1 capsule (50,000 Units total) by mouth every 7 (seven) days. Monday Patient taking differently: Take 50,000 Units by mouth every Monday. 10/05/21  Yes Brunetta Genera, MD  ipratropium (ATROVENT) 0.02 % nebulizer solution Take 2.5 mLs (0.5 mg total) by nebulization 2 (two) times daily. Patient not taking: Reported on 11/03/2021 08/28/21   Angiulli, Lavon Paganini, PA-C  traZODone (DESYREL) 50 MG tablet Take 1 tablet (50 mg total) by mouth at bedtime. Patient not taking: Reported on 11/03/2021 08/25/21   Cathlyn Parsons, PA-C     Critical care time: n/a     Otilio Carpen Jessika Rothery, PA-C [facility] See Amion for pager If no response to pager , please call [phone] until 7pm After 7:00 pm call Elink  [phone]

## 2021-11-10 NOTE — Progress Notes (Signed)
While completing administration of antibiotic, 2200 was the last known well time.

## 2021-11-11 DIAGNOSIS — Z7189 Other specified counseling: Secondary | ICD-10-CM | POA: Diagnosis not present

## 2021-11-11 DIAGNOSIS — I10 Essential (primary) hypertension: Secondary | ICD-10-CM | POA: Diagnosis not present

## 2021-11-11 DIAGNOSIS — Z515 Encounter for palliative care: Secondary | ICD-10-CM

## 2021-11-11 DIAGNOSIS — I4891 Unspecified atrial fibrillation: Secondary | ICD-10-CM | POA: Diagnosis not present

## 2021-11-11 DIAGNOSIS — I5031 Acute diastolic (congestive) heart failure: Secondary | ICD-10-CM | POA: Diagnosis not present

## 2021-11-11 DIAGNOSIS — C3492 Malignant neoplasm of unspecified part of left bronchus or lung: Secondary | ICD-10-CM | POA: Diagnosis not present

## 2021-11-11 LAB — CBC WITH DIFFERENTIAL/PLATELET
Abs Immature Granulocytes: 0.39 10*3/uL — ABNORMAL HIGH (ref 0.00–0.07)
Basophils Absolute: 0 10*3/uL (ref 0.0–0.1)
Basophils Relative: 0 %
Eosinophils Absolute: 0 10*3/uL (ref 0.0–0.5)
Eosinophils Relative: 0 %
HCT: 25.3 % — ABNORMAL LOW (ref 39.0–52.0)
Hemoglobin: 8.4 g/dL — ABNORMAL LOW (ref 13.0–17.0)
Immature Granulocytes: 6 %
Lymphocytes Relative: 4 %
Lymphs Abs: 0.3 10*3/uL — ABNORMAL LOW (ref 0.7–4.0)
MCH: 34.3 pg — ABNORMAL HIGH (ref 26.0–34.0)
MCHC: 33.2 g/dL (ref 30.0–36.0)
MCV: 103.3 fL — ABNORMAL HIGH (ref 80.0–100.0)
Monocytes Absolute: 0.6 10*3/uL (ref 0.1–1.0)
Monocytes Relative: 9 %
Neutro Abs: 5.7 10*3/uL (ref 1.7–7.7)
Neutrophils Relative %: 81 %
Platelets: 93 10*3/uL — ABNORMAL LOW (ref 150–400)
RBC: 2.45 MIL/uL — ABNORMAL LOW (ref 4.22–5.81)
RDW: 14.6 % (ref 11.5–15.5)
WBC: 7.1 10*3/uL (ref 4.0–10.5)
nRBC: 0 % (ref 0.0–0.2)

## 2021-11-11 LAB — BASIC METABOLIC PANEL
Anion gap: 6 (ref 5–15)
BUN: 12 mg/dL (ref 8–23)
CO2: 29 mmol/L (ref 22–32)
Calcium: 8.7 mg/dL — ABNORMAL LOW (ref 8.9–10.3)
Chloride: 96 mmol/L — ABNORMAL LOW (ref 98–111)
Creatinine, Ser: 0.72 mg/dL (ref 0.61–1.24)
GFR, Estimated: >60 mL/min (ref >60)
Glucose, Bld: 137 mg/dL — ABNORMAL HIGH (ref 70–99)
Potassium: 4 mmol/L (ref 3.5–5.1)
Sodium: 131 mmol/L — ABNORMAL LOW (ref 135–145)

## 2021-11-11 LAB — MAGNESIUM: Magnesium: 2 mg/dL (ref 1.7–2.4)

## 2021-11-11 MED ORDER — RIVAROXABAN 20 MG PO TABS
20.0000 mg | ORAL_TABLET | Freq: Every evening | ORAL | Status: DC
  Administered 2021-11-11 – 2021-11-20 (×10): 20 mg via ORAL
  Filled 2021-11-11 (×10): qty 1

## 2021-11-11 MED ORDER — METOPROLOL TARTRATE 25 MG PO TABS
37.5000 mg | ORAL_TABLET | Freq: Two times a day (BID) | ORAL | Status: DC
  Administered 2021-11-11: 37.5 mg via ORAL
  Filled 2021-11-11: qty 1

## 2021-11-11 MED ORDER — DIGOXIN 125 MCG PO TABS: 0.2500 mg | ORAL_TABLET | Freq: Every day | ORAL | Status: DC

## 2021-11-11 NOTE — Plan of Care (Signed)

## 2021-11-11 NOTE — Consult Note (Signed)
NAME:  Corey [name], MRN:  [id number], DOB:  10-18-1945, LOS: 8 ADMISSION DATE:  11/03/2021, CONSULTATION DATE:  11/11/21 REFERRING MD:  Tenny Craw, CHIEF COMPLAINT:  Shortness of breath   History of Present Illness:  Corey [name] is a 76 y.o. 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  Cultures have been negative, legionella and strep pneumo also negative.   He was initially started on Vanc and Cefepime, MRSA negative so continued on Cefepime.  PCCM consulted in this setting.    Since admission pt has been intermittently in Atrial fibrillation with RVR despite Cardizem, cardiology evaluated and suspect secondary to acute illness.   Overnight on  2/6, patient was noted to have slurred speech and dysarthria and code code stroke was called.   Imaging revealed no acute infarcts or evidence of LVO.   This morning is back to neurologic baseline.   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   Significant  Hospital Events: Including procedures, antibiotic start and stop dates in addition to other pertinent events   1/31 Admitted to hospital medicine service with weakness and CAP  2/6 concern for acute stroke, imaging negative 2/7 PCCM consult, started on prednisone and digoxin   Interim History / Subjective:   Started on digoxin yesterday with improvement in his HR around 11pm last night  Started prednisone 40mg  yesterday with significant improvement in cough.   Patient feeling much better today.  Objective   Blood pressure 109/62, pulse (!) 104, temperature 98.1 F (36.7 C), temperature source Oral, resp. rate 17, SpO2 98 %.        Intake/Output Summary (Last 24 hours) at 11/11/2021 1049 Last data filed at 11/11/2021 0700 Gross per 24 hour  Intake 586.79 ml  Output 2300 ml  Net -1713.21 ml   There were no vitals filed for this visit.  General:  elderly gentleman, awake, conversational and in no acute distress HEENT: MM pink/dry, pupils equal, sclera anicteric Neuro: awake, oriented, speech clear, no facial droop or pronator drift, moving all extremities with equal strength throughout CV: s1s2 rrr, no m/r/g PULM:  diminished breath sounds on left. Clear on right. No wheezing or rhonchi. GI: soft, bsx4 active  Extremities: warm/dry, no edema, erythematous  LLE, non-tender Skin: no rashes or lesions   Resolved Hospital Problem list     Assessment & Plan:    Acute Hypoxic Respiratory Failure  Extensive irregular opacities L lung, likely CAP vs post-obstructive pneuonia vs pneumonitis 2/2 chemo Stage IV non-small cell lung ca - continue flagyl for total of 5 day course and cefepime - Continue prednisone 40mg  daily for pneumonitis. We will plan to taper him down by 10mg  every 7 days  over the next month - Continue brovana + yupelri nebulizer treatments while inpatient. Can go back to stiolto inhaler upon discharge with his pulmicort neb. - Less concern for PE given his  treatment with xarelto  PCCM will continue to follow  Best Practice (right click and "Reselect all SmartList Selections" daily)   Per primary  Labs   CBC: Recent Labs  Lab 11/07/21 0237 11/08/21 0500 11/09/21 0445 11/10/21 0050 11/10/21 0316 11/11/21 0242  WBC 2.1* 3.4* 6.4 7.4 7.6 7.1  NEUTROABS 1.4* 2.5 4.7  --  5.6 5.7  HGB 8.9* 7.6* 8.2* 8.4* 8.0* 8.4*  HCT 27.1* 23.3* 25.3* 25.1* 24.4* 25.3*  MCV 101.9* 103.6* 103.7* 102.0* 102.5* 103.3*  PLT 66* 58* 65* 72* 71* 93*    Basic Metabolic Panel: Recent Labs  Lab 11/07/21 0237 11/07/21 1609 11/08/21 0500 11/09/21 0445 11/10/21 0050 11/10/21 0316 11/11/21 0242  NA 123*   < > 128* 128* 129* 129* 131*  K 3.7   < > 3.5 3.7 3.4* 3.5 4.0  CL 93*   < > 95* 95* 93* 94* 96*  CO2 23   < > 25 26 27 28 29   GLUCOSE 118*   < > 118* 119* 113* 111* 137*  BUN 10   < > 9 9 10 10 12   CREATININE 0.59*   < > 0.76 0.75 0.78 0.83 0.72  CALCIUM 8.1*   < > 8.4* 8.4* 8.5* 8.5* 8.7*  MG 1.8  --  2.0 1.7  --  1.6* 2.0   < > = values in this interval not displayed.   GFR: CrCl cannot be calculated (Unknown ideal weight.). Recent Labs  Lab 11/09/21 0445 11/10/21 0050 11/10/21 0316 11/11/21 0242  PROCALCITON  --   --  0.27  --   WBC 6.4 7.4 7.6 7.1    Liver Function Tests: Recent Labs  Lab 11/10/21 0050  AST 24  ALT 5  ALKPHOS 49  BILITOT 0.7  PROT 6.0*  ALBUMIN 2.7*   No results for input(s): LIPASE, AMYLASE in the last 168 hours. No results for input(s): AMMONIA in the last 168 hours.  ABG    Component Value Date/Time   PHART 7.411 08/01/2021 1530   PCO2ART 31.9 (L) 08/01/2021 1530   PO2ART 114 (H) 08/01/2021 1530   HCO3 19.9 (L) 08/01/2021 1530   TCO2 26 10/31/2012 1253   ACIDBASEDEF 3.7 (H) 08/01/2021 1530   O2SAT 99.0 08/01/2021 1530     Coagulation Profile: Recent Labs  Lab 11/10/21 0050  INR 2.6*    Cardiac Enzymes: No results for input(s): CKTOTAL, CKMB, CKMBINDEX, TROPONINI in the last 168  hours.  HbA1C: Hgb A1c MFr Bld  Date/Time Value Ref Range Status  07/07/2012 05:30 AM 5.9 (H) <5.7 % Final    Comment:    (NOTE)                                                                       According to the ADA Clinical Practice Recommendations for 2011, when HbA1c is used as a screening test:  >=6.5%   Diagnostic of Diabetes Mellitus           (if abnormal result is confirmed) 5.7-6.4%   Increased risk of developing Diabetes  [place] References:Diagnosis and Classification of Diabetes Mellitus,Diabetes WCBJ,6283,15(VVOHY 1):S62-S69 and Standards of Medical Care in         Diabetes - 2011,Diabetes WVPX,1062,69 (Suppl 1):S11-S61.  07/06/2012 12:24 AM 6.0 (H) <5.7 % Final    Comment:    (NOTE)                                                                       According to the ADA Clinical Practice Recommendations for 2011, when HbA1c is used as a screening test:  >=6.5%   Diagnostic of Diabetes Mellitus           (if abnormal result is confirmed) 5.7-6.4%   Increased risk of developing Diabetes Mellitus References:Diagnosis and Classification of Diabetes Mellitus,Diabetes SWNI,6270,35(KKXFG 1):S62-S69 and Standards of Medical Care in         Diabetes - 2011,Diabetes HWEX,9371,69 (Suppl 1):S11-S61.    CBG: Recent Labs  Lab 11/10/21 0037  GLUCAP 105*    Critical care time: n/a     Freda Jackson, MD [facility] Office: [phone]   See Amion for personal pager PCCM on call pager [phone] until 7pm. Please call Elink 7p-7a. [phone]

## 2021-11-11 NOTE — Consult Note (Signed)
Consultation Note Date: 11/11/2021   Patient Name: Corey [name]  DOB: [birth year]  MRN: [id number]  Age / Sex: 76 y.o., male  PCP: [name], MD Referring Physician: Donne Hazel, MD  Reason for Consultation: Establishing goals of care  HPI/Patient Profile: 76 y.o. male  with past medical history of stage IV NSCLC with liver mets, PAF, CAD, anemia, anxiety, OA, HLD, HTN, GERD, OSA, Prostate cancer admitted on 11/03/2021 with CAP, pneumonitis, a fib with RVR, hyponatremia, volume overload, sepsis, and dysarthria.   Clinical Assessment and Goals of Care: I met today with Mr. Whitby and his family. We discussed clinical course as well as wishes moving forward in regard to advanced directives.  Concepts specific to code status and rehospitalization discussed.  We discussed his multiple comorbidities and difference between a aggressive medical intervention path and a palliative, comfort focused care path.  Values and goals of care important to patient and family were attempted to be elicited.   Concept of Hospice and Palliative Care were discussed   Questions and concerns addressed.   PMT will continue to support holistically.   SUMMARY OF RECOMMENDATIONS   - DNR/DNI - Continue current care.  He is hopeful for continued improvement with continued care.  He is leaning toward plan for no further chemotherapy, but has not made a final decision regarding this.  We discussed that likely plan would be to transition to rehab at end of hospitalization to see how much functional status he can regain.  Following this, plan would be to either follow-up with Dr. [name] for consideration for further chemotherapy, or to return home and elect his hospice benefits depending upon how he is feeling and how he does with rehab. - Plan for follow-up meeting on Saturday at [facility].       Primary Diagnoses: Present on Admission:   Atrial fibrillation with RVR ([place])  Coronary artery disease  Benign essential HTN  Dyslipidemia  GERD (gastroesophageal reflux disease)  Hyponatremia  Non-small cell carcinoma of left lung, stage 4 (HCC)  CAP (community acquired pneumonia)  Leukopenia  Macrocytic anemia  (Resolved) Sepsis ([place])   I have reviewed the medical record, interviewed the patient and family, and examined the patient. The following aspects are pertinent.  Past Medical History:  Diagnosis Date   Anemia    Anxiety    Arthritis    "back, right knee, hands, ankles, neck" (05/31/2016)   Carotid artery disease (HCC)    Chronic lower back pain    Coronary atherosclerosis of native coronary artery    a. BMS to [facility] 2004 and 2007, otherwise mild nonobstructive disease. EF normal.   Diverticulitis    Dyslipidemia    Essential hypertension, benign    Family history of breast cancer    Family history of lung cancer    Family history of prostate cancer    GERD (gastroesophageal reflux disease)    Lumbar radiculopathy, chronic 02/04/2015   Right L5   Lung cancer (HCC)    Mild dilation of ascending aorta (  [place]) 08/2021   Obesity    OSA on CPAP    uses cpap   Parkinson's disease ([name])    Paroxysmal atrial fibrillation ([place])    a. Discovered after stroke.   Pneumonia 01/1996   PONV (postoperative nausea and vomiting)    Prostate CA (HCC)    Recurrent upper respiratory infection (URI)    Stroke ([place]) 07/2012   no deficits   Symptomatic anemia    Visit for monitoring Tikosyn therapy 09/18/2019   Social History   Socioeconomic History   Marital status: Married    Spouse name: Renee   Number of children: 3   Years of education: 14   Highest education level: Not on file  Occupational History   Occupation: Retired    Fish farm manager: DISABLED     Comment: Works at [employer] part time  Tobacco Use   Smoking status: Former    Packs/day: 1.00    Years: 34.00    Pack years: 34.00    Types: Cigarettes     Quit date: 05/05/2003    Years since quitting: 18.5    Passive exposure: Never   Smokeless tobacco: Never  Vaping Use   Vaping Use: Never used  Substance and Sexual Activity   Alcohol use: No    Alcohol/week: 0.0 standard drinks   Drug use: No   Sexual activity: Not Currently  Other Topics Concern   Not on file  Social History Narrative   Patient lives at home with spouse.   Caffeine Use:    Social Determinants of Radio broadcast assistant Strain: Not on file  Food Insecurity: Not on file  Transportation Needs: Not on file  Physical Activity: Not on file  Stress: Not on file  Social Connections: Not on file   Family History  Problem Relation Age of Onset   Hypertension Mother    Heart disease Father    Heart attack Father    Prostate cancer Brother 10   Parkinson's disease Brother    Lung cancer Paternal Aunt        hx of smoking   Breast cancer Paternal Grandmother        dx 78s, bilateral mastectomies   Heart attack Paternal Grandfather    Blindness Son    Colon cancer Neg Hx    Pancreatic cancer Neg Hx    Scheduled Meds:  acidophilus  1 capsule Oral Daily   arformoterol  15 mcg Nebulization BID   atorvastatin  40 mg Oral QHS   budesonide  0.5 mg Nebulization BID   carbidopa-levodopa  1 tablet Oral 4 times per day   Carbinoxamine Maleate  6 mg Oral BID   Chlorhexidine Gluconate Cloth  6 each Topical Daily   feeding supplement  237 mL Oral TID BM   fenofibrate  160 mg Oral Q1400   fluticasone  2 spray Each Nare Daily   folic acid  1 mg Oral Daily   guaiFENesin  1,200 mg Oral BID   ipratropium  2 spray Each Nare q AM   lactulose  10 g Oral BID   levalbuterol  0.63 mg Nebulization BID   mouth rinse  15 mL Mouth Rinse BID   metoprolol tartrate  37.5 mg Oral BID   multivitamin with minerals  1 tablet Oral Daily   pantoprazole  40 mg Oral Daily   polyethylene glycol  17 g Oral BID   predniSONE  40 mg Oral Q breakfast   revefenacin  175 mcg Nebulization  Daily   rivaroxaban  20 mg Oral QPM   senna-docusate  1 tablet Oral BID   sodium chloride flush  3 mL Intravenous Q12H   sorbitol  30 mL Oral Daily   tamsulosin  0.4 mg Oral QPC supper   topiramate  100 mg Oral BID   Continuous Infusions:  sodium chloride Stopped (11/06/21 1119)   sodium chloride Stopped (11/10/21 0152)   sodium chloride 10 mL/hr at 11/11/21 2000   amiodarone 30 mg/hr (11/11/21 2000)   diltiazem (CARDIZEM) infusion 10 mg/hr (11/11/21 2000)   metronidazole 500 mg (11/11/21 2106)   PRN Meds:.sodium chloride, sodium chloride, acetaminophen **OR** acetaminophen, ALPRAZolam, alum & mag hydroxide-simeth, benzonatate, dextromethorphan, levalbuterol, lip balm, metoCLOPramide (REGLAN) injection, nitroGLYCERIN, sodium chloride flush Medications Prior to Admission:  Prior to Admission medications   Medication Sig Start Date End Date Taking? Authorizing Provider  acetaminophen (TYLENOL) 325 MG tablet Take 2 tablets (650 mg total) by mouth every 6 (six) hours as needed for mild pain (or Fever >/= 101). 08/25/21  Yes Angiulli, Lavon Paganini, PA-C  albuterol (PROVENTIL) (2.5 MG/3ML) 0.083% nebulizer solution Inhale 3 mLs into the lungs every 4 (four) hours as needed for wheezing or shortness of breath. Patient taking differently: Inhale 3 mLs into the lungs See admin instructions. Nebulize 2.5 mg & inhale into the lungs 3-4 times a day and may also use an additional 2.5 mg up to two times a day as needed for shortness of breath 08/28/21  Yes Angiulli, Lavon Paganini, PA-C  ALPRAZolam Duanne Moron) 0.5 MG tablet Take 1 tablet (0.5 mg total) by mouth at bedtime as needed for anxiety. Patient taking differently: Take 0.5 mg by mouth at bedtime as needed for anxiety or sleep. 08/25/21  Yes Angiulli, Lavon Paganini, PA-C  atorvastatin (LIPITOR) 40 MG tablet Take 1 tablet (40 mg total) by mouth daily. Patient taking differently: Take 40 mg by mouth at bedtime. 08/25/21  Yes Angiulli, Lavon Paganini, PA-C  benzonatate  (TESSALON) 200 MG capsule Take 1 capsule (200 mg total) by mouth 3 (three) times daily. Patient taking differently: Take 200 mg by mouth in the morning, at noon, and at bedtime. 10/07/21  Yes Heilingoetter, Cassandra L, PA-C  budesonide (PULMICORT) 0.5 MG/2ML nebulizer solution Take 2 mLs (0.5 mg total) by nebulization 2 (two) times daily. Patient taking differently: Take 0.5 mg by nebulization in the morning and at bedtime. 09/25/21  Yes Icard, Bradley L, DO  carbidopa-levodopa (SINEMET IR) 25-100 MG tablet Take 1 tablet by mouth 3 (three) times daily. Patient taking differently: Take 1 tablet by mouth See admin instructions. Take 1 tablet by mouth at 8 AM, 12 PM, 4 PM, and bedtime 07/09/21  Yes Tat, Eustace Quail, DO  chlorpheniramine-HYDROcodone (TUSSIONEX) 10-8 MG/5ML SUER Take 5 mLs by mouth every 12 (twelve) hours as needed for cough. Patient taking differently: Take 5 mLs by mouth at bedtime. 08/25/21  Yes Angiulli, Lavon Paganini, PA-C  dofetilide (TIKOSYN) 500 MCG capsule Take 1 capsule (500 mcg total) by mouth 2 (two) times daily. Please keep upcoming appointment in February for future refills. Thank you Patient taking differently: Take 500 mcg by mouth 2 (two) times daily. 08/25/21  Yes Angiulli, Lavon Paganini, PA-C  fenofibrate 160 MG tablet TAKE 1 TABLET DAILY. PLEASE KEEP UPCOMING APPT IN MARCH WITH DR. Acie Fredrickson BEFORE ANYMORE REFILLS. Patient taking differently: Take 160 mg by mouth See admin instructions. Take 160 mg by mouth at 2 PM daily 08/25/21  Yes Angiulli, Lavon Paganini, PA-C  fluticasone (FLONASE) 50  MCG/ACT nasal spray Place 2 sprays into both nostrils daily. 08/25/21  Yes Angiulli, Lavon Paganini, PA-C  folic acid (FOLVITE) 1 MG tablet Take 1 tablet (1 mg total) by mouth daily. 08/25/21  Yes Angiulli, Lavon Paganini, PA-C  guaiFENesin (MUCINEX) 600 MG 12 hr tablet Take 2 tablets (1,200 mg total) by mouth 2 (two) times daily. 08/12/21  Yes Kathie Dike, MD  ipratropium (ATROVENT) 0.03 % nasal spray Place 1-2  sprays into both nostrils every 12 (twelve) hours. For drainage Patient taking differently: Place 2 sprays into both nostrils in the morning. 08/28/21  Yes Angiulli, Lavon Paganini, PA-C  levalbuterol (XOPENEX) 0.63 MG/3ML nebulizer solution Take 3 mLs (0.63 mg total) by nebulization 2 (two) times daily. 09/25/21  Yes Icard, Octavio Graves, DO  loratadine (CLARITIN) 10 MG tablet Take 1 tablet (10 mg total) by mouth daily as needed for allergies (nasal drainage). Patient taking differently: Take 10 mg by mouth daily. 08/25/21  Yes Angiulli, Lavon Paganini, PA-C  magnesium oxide (MAG-OX) 400 (240 Mg) MG tablet Take 1 tablet (400 mg total) by mouth daily. 08/25/21  Yes Angiulli, Lavon Paganini, PA-C  metoprolol succinate (TOPROL-XL) 25 MG 24 hr tablet Take 1 tablet (25 mg total) by mouth daily. 08/25/21  Yes Angiulli, Lavon Paganini, PA-C  Multiple Vitamins-Minerals (MULTIVITAMINS THER. W/MINERALS) TABS Take 1 tablet by mouth daily with breakfast.   Yes [provider]  nitroGLYCERIN (NITROSTAT) 0.4 MG SL tablet Place 1 tablet (0.4 mg total) under the tongue every 5 (five) minutes as needed for chest pain. 08/25/21  Yes Angiulli, Lavon Paganini, PA-C  omeprazole (PRILOSEC) 20 MG capsule Take 1 capsule (20 mg total) by mouth daily. Patient taking differently: Take 20 mg by mouth daily before breakfast. 08/25/21  Yes Angiulli, Lavon Paganini, PA-C  ondansetron (ZOFRAN) 8 MG tablet Take 8 mg by mouth every 8 (eight) hours as needed for nausea or vomiting.   Yes [provider]  polyethylene glycol (MIRALAX / GLYCOLAX) 17 g packet Take 17 g by mouth daily. Patient taking differently: Take 17 g by mouth daily as needed for mild constipation (MIX AS DIRECTED AND DRINK). 08/25/21  Yes Angiulli, Lavon Paganini, PA-C  potassium chloride (KLOR-CON) 10 MEQ tablet TAKE 1 TABLET BY MOUTH EVERY DAY Patient taking differently: Take 10 mEq by mouth See admin instructions. Take 10 mEq by mouth at 2 PM daily 07/16/21  Yes Nahser, Wonda Cheng, MD   rivaroxaban (XARELTO) 20 MG TABS tablet Take 1 tablet (20 mg total) by mouth daily. Patient taking differently: Take 20 mg by mouth every evening. 08/25/21  Yes Angiulli, Daniel J, PA-C  RYVENT 6 MG TABS TAKE 1 TABLET BY MOUTH 2 (TWO) TIMES DAILY AS NEEDED (DRAINAGE). Patient taking differently: Take 6 mg by mouth in the morning and at bedtime. 10/29/21  Yes Garnet Sierras, DO  Saccharomyces boulardii (PROBIOTIC) 250 MG CAPS Take 250 mg by mouth daily.   Yes [provider]  tamsulosin (FLOMAX) 0.4 MG CAPS capsule Take 1 capsule (0.4 mg total) by mouth daily after supper. 08/25/21  Yes Angiulli, Lavon Paganini, PA-C  Tiotropium Bromide-Olodaterol (STIOLTO RESPIMAT) 2.5-2.5 MCG/ACT AERS Inhale 2 puffs into the lungs daily. 09/25/21  Yes Icard, Bradley L, DO  topiramate (TOPAMAX) 50 MG tablet Take 2 tablets (100 mg total) by mouth 2 (two) times daily. 08/25/21  Yes Angiulli, Lavon Paganini, PA-C  traMADol (ULTRAM) 50 MG tablet Take 1 tablet (50 mg total) by mouth every 6 (six) hours as needed for moderate pain. 08/25/21  Yes Angiulli, Lavon Paganini, PA-C  Vitamin D, Ergocalciferol, (DRISDOL) 1.25 MG (50000 UNIT) CAPS capsule Take 1 capsule (50,000 Units total) by mouth every 7 (seven) days. Monday Patient taking differently: Take 50,000 Units by mouth every Monday. 10/05/21  Yes Brunetta Genera, MD  ipratropium (ATROVENT) 0.02 % nebulizer solution Take 2.5 mLs (0.5 mg total) by nebulization 2 (two) times daily. Patient not taking: Reported on 11/03/2021 08/28/21   Angiulli, Lavon Paganini, PA-C  traZODone (DESYREL) 50 MG tablet Take 1 tablet (50 mg total) by mouth at bedtime. Patient not taking: Reported on 11/03/2021 08/25/21   Angiulli, Lavon Paganini, PA-C   Allergies  Allergen Reactions   Amoxicillin Anaphylaxis and Swelling    Tolerates Rocephin   Fosinopril Other (See Comments) and Cough    Muscle aches   Hydromorphone Nausea And Vomiting   Ampicillin Rash   Codeine Nausea Only and Other (See Comments)     Heavy amounts cause nausea and stomach upset   Monopril [Fosinopril Sodium] Other (See Comments)    Muscle aches & pain   Rosuvastatin Other (See Comments)    Joint pain   Physical Exam General: Alert, awake, in no acute distress.   HEENT: No bruits, no goiter, no JVD Heart: Regular rate and rhythm. No murmur appreciated. Lungs: Good air movement, clear Abdomen: Soft, nontender, nondistended, positive bowel sounds.   Ext: Some edema Skin: Warm and dry Neuro: Grossly intact, nonfocal.  Still noted some dysarthria  Vital Signs: BP 116/62    Pulse 91    Temp (!) 97.5 F (36.4 C) (Axillary)    Resp (!) 22    SpO2 97%  Pain Scale: 0-10   Pain Score: 0-No pain   SpO2: SpO2: 97 % O2 Device:SpO2: 97 % O2 Flow Rate: .O2 Flow Rate (L/min): 3 L/min  IO: Intake/output summary:  Intake/Output Summary (Last 24 hours) at 11/11/2021 2115 Last data filed at 11/11/2021 2000 Gross per 24 hour  Intake 1089.91 ml  Output 3050 ml  Net -1960.09 ml    LBM: Last BM Date: 11/11/21 Baseline Weight:   Most recent weight:       Palliative Assessment/Data:     Time In: 1100 Time Out: 1225 Time Total: 85 Greater than 50%  of this time was spent counseling and coordinating care related to the above assessment and plan.  Signed by: Micheline Rough, MD   Please contact Palliative Medicine Team phone at [phone] for questions and concerns.  For individual provider: See Shea Evans

## 2021-11-11 NOTE — TOC Progression Note (Signed)
Transition of Care [facility]) - Progression Note    Patient Details  Name: Corey [name] MRN: [id number] Date of Birth: 08-06-1946  [facility]) CM/SW Contact  Leeroy Cha, RN Phone Number: 11/11/2021, 10:15 AM  Clinical Narrative:    Palliative care family meeting today at 11am/following for toc needs     Barriers to Discharge: Continued Medical Work up  Expected Discharge Plan and Services                                                 Social Determinants of Health (SDOH) Interventions    Readmission Risk Interventions Readmission Risk Prevention Plan 08/05/2021  Transportation Screening Complete  PCP or Specialist appointment within 3-5 days of discharge Complete  HRI or [place] Complete  SW Recovery Care/Counseling Consult Complete  [place] Not Applicable  Some recent data might be hidden

## 2021-11-11 NOTE — Telephone Encounter (Signed)
Corey Nash, DO  Potts, Corey [name], CMA; [facility] Triage Pool; Erin Fulling Cheryle Horsfall, MD 1 hour ago (7:58 AM)    The hospitalist physician can consult our inpatient pulmonary service if they feel that is necessary.  Unfortunately, I am not working at [employer] this week.   It looks like he was seen by one of my partners Dr. Erin Fulling yesterday.  Thanks Jenner!   Corey Nash, DO  [facility]  11/11/2021 7:57 AM     Called and spoke with pt's spouse Corey [name] letting her know the info per BI and she verbalized understanding. She stated that someone  did come by to see pt yesterday 2/7. Nothing further needed.

## 2021-11-11 NOTE — Progress Notes (Signed)
Progress Note   Patient: Corey [name] WTU:[id number] DOB: 02/23/1946 DOA: 11/03/2021     8 DOS: the patient was seen and examined on 11/11/2021   Brief hospital course: Corey [name] is a 76 y.o. male with PMH stage IV NSCLC with liver mets, PAF, CAD, chronic low back p ain, anemia, anxiety, osteoarthritis, HLD, HTN, GERD, OSA on CPAP, prostate cancer who presented with approximately 4 days of progressively worsening weakness and ongoing productive cough of yellow sputum with associated pleuritic chest pain, malaise, dyspnea.  He recently deceived chemotherapy within the past week as well.  Due to continued symptoms of feeling poorly, he presented for further evaluation. On evaluation in the ER, he underwent CXR which showed ongoing extensive irregular opacities involving the left lung not significantly changed from prior imaging.  Prior CT chest on 10/15/2021 showed a stable dense consolidation in the lingula and left lower lobe. Notable labs included WBC 2.8, platelet count 158.  Sodium 128.  BNP 187. There was concern for possible pneumonia and he was started on vancomycin and cefepime. He also developed A. fib with RVR and was started on a Cardizem drip.  Assessment and Plan: * Atrial fibrillation with RVR ([place])- (present on admission) - on Tikosyn, Toprol, and Xarelto at home - Patient noted to be in A. fib with RVR on admission - s/p cardizem drip on admission but BP low/normal with prolonged QTc - cardiology consulted for further assistance given complexity of med regimen and prolonged QTc - continue amio per cardiology. Also started on digoxin trial on 2/7. On dilt gtt for now, to be weaned by Cardiology - may need consideration of DCCV but unfortunately does have multiple chronic issues notably cancer; palliative care has been consulted as well  - follow up cardiology rec's and changes -metoprolol increased to 37.5mg  bid per Cardiology -Xarelto restarted  Dysarthria - acutely happened  overnight of 2/6 with concern for a stroke. 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% left ICA stenosis which may or may not need outpatient follow up pending clinical course but suspect we are heading in a hospice direction at this point -Other considered etiology for this may be due to the overall progressive decline from his malignancy - no obvious stroke on [facility]; does have chronic left parietal lobe infarct noted -Follow-up MRI brain and if no obvious contraindication, we will resume Xarelto today  Stasis dermatitis - more prominent on LLE; no overt cellulitis signs as nontender as well - B/L LE duplex negative for DVT as well, performed 11/06/21 - likely caused by ongoing LE edema - some improvement in LE edema with lasix as well; will try and apply TED hose soon too   Prolonged Q-T interval on ECG - tikosyn now officially discontinued per cardiology - continue monitoring electrolytes; goal K>4, Mg>2  Macrocytic anemia- (present on admission) - no overt bleeding - downtrend suspected in setting of chemo recently   Leukopenia- (present on admission) - likely multifactorial from underlying infection plus recent chemo  -resolved  CAP (community acquired pneumonia)- (present on admission) - ongoing LLL consolidation which may be acutely worse on admission; symptomatically he felt worse over the past ~1 week prior to admission - s/p vanc and cefepime  - to complete flagyl on 2/10 - flagyl added 2/6 and will treat x 5 days per pulm - follow up cultures (blood cultures remain negative)  Non-small cell carcinoma of left lung, stage 4 ([place])- (present on admission) - follows outpatient with Dr. [name] -Palliative Care consulted -  This AM, pt reports having the "will" to proceed with therapy moving forward - follow up further discussions with palliative care once able to establish   Hyponatremia- (present on admission) - appeared clinically volume depleted on admission - unfortunately edema was a  big issue in LE; given hypoalbuminemia he is likely 3rd spacing. FeNa calculated <<1% on 11/06/21 consistent with intravascular volume depletion and worsening hyponatremia at that time - pt is s/p albumin - Na improving with albumin and lasix; cardiology also giving this intermittently  - repeat BMP in AM  OSA on CPAP - continue nightly CPAP  GERD (gastroesophageal reflux disease)- (present on admission) - continue PPI  Dyslipidemia- (present on admission) - Continue Lipitor  Benign essential HTN- (present on admission) - BP stable and controlled at this time - continue monitoring   Coronary artery disease- (present on admission) - Continue Lipitor, BB -On Xarelto for underlying A. fib  Sepsis (HCC)-resolved as of 11/08/2021, (present on admission) - neutropenia, tachycardia, tachypnea, lung source suspected - see pna treatment         Subjective: Reports feeling better today  Physical Exam: Vitals:   11/11/21 0400 11/11/21 0700 11/11/21 0747 11/11/21 1131  BP: 100/73 109/62    Pulse: 88 (!) 104    Resp: 16 17    Temp:   98.1 F (36.7 C)   TempSrc:   Oral   SpO2: 97% 96% 98% 96%   General exam: Awake, laying in bed, in nad Respiratory system: Normal respiratory effort, no wheezing Cardiovascular system: regular rate, s1, s2 Gastrointestinal system: Soft, nondistended, positive BS Central nervous system: CN2-12 grossly intact, strength intact Extremities: Perfused, no clubbing Skin: Normal skin turgor, no notable skin lesions seen Psychiatry: Mood normal // no visual hallucinations   Data Reviewed:  Labs reviewed  Family Communication: Pt in room, family not at bedside  Disposition: Status is: Inpatient Remains inpatient appropriate because: Severity of illness      Planned Discharge Destination: Barriers to discharge: amiodarone gtt, dilt gtt, cardiology cont to follow      Author: Marylu Lund, MD 11/11/2021 1:45 PM  For on call review  [email or web address].

## 2021-11-12 ENCOUNTER — Telehealth: Payer: Self-pay | Admitting: Pulmonary Disease

## 2021-11-12 DIAGNOSIS — I2510 Atherosclerotic heart disease of native coronary artery without angina pectoris: Secondary | ICD-10-CM | POA: Diagnosis not present

## 2021-11-12 DIAGNOSIS — I10 Essential (primary) hypertension: Secondary | ICD-10-CM | POA: Diagnosis not present

## 2021-11-12 DIAGNOSIS — I4891 Unspecified atrial fibrillation: Secondary | ICD-10-CM | POA: Diagnosis not present

## 2021-11-12 LAB — CBC WITH DIFFERENTIAL/PLATELET
Abs Immature Granulocytes: 0.53 10*3/uL — ABNORMAL HIGH (ref 0.00–0.07)
Basophils Absolute: 0 10*3/uL (ref 0.0–0.1)
Basophils Relative: 0 %
Eosinophils Absolute: 0 10*3/uL (ref 0.0–0.5)
Eosinophils Relative: 0 %
HCT: 26.1 % — ABNORMAL LOW (ref 39.0–52.0)
Hemoglobin: 8.4 g/dL — ABNORMAL LOW (ref 13.0–17.0)
Immature Granulocytes: 7 %
Lymphocytes Relative: 6 %
Lymphs Abs: 0.4 10*3/uL — ABNORMAL LOW (ref 0.7–4.0)
MCH: 33.5 pg (ref 26.0–34.0)
MCHC: 32.2 g/dL (ref 30.0–36.0)
MCV: 104 fL — ABNORMAL HIGH (ref 80.0–100.0)
Monocytes Absolute: 0.6 10*3/uL (ref 0.1–1.0)
Monocytes Relative: 8 %
Neutro Abs: 6.1 10*3/uL (ref 1.7–7.7)
Neutrophils Relative %: 79 %
Platelets: 127 10*3/uL — ABNORMAL LOW (ref 150–400)
RBC: 2.51 MIL/uL — ABNORMAL LOW (ref 4.22–5.81)
RDW: 14.6 % (ref 11.5–15.5)
WBC: 7.6 10*3/uL (ref 4.0–10.5)
nRBC: 0 % (ref 0.0–0.2)

## 2021-11-12 LAB — MAGNESIUM: Magnesium: 1.9 mg/dL (ref 1.7–2.4)

## 2021-11-12 LAB — BASIC METABOLIC PANEL
Anion gap: 6 (ref 5–15)
BUN: 11 mg/dL (ref 8–23)
CO2: 30 mmol/L (ref 22–32)
Calcium: 8.7 mg/dL — ABNORMAL LOW (ref 8.9–10.3)
Chloride: 97 mmol/L — ABNORMAL LOW (ref 98–111)
Creatinine, Ser: 0.74 mg/dL (ref 0.61–1.24)
GFR, Estimated: >60 mL/min (ref >60)
Glucose, Bld: 131 mg/dL — ABNORMAL HIGH (ref 70–99)
Potassium: 4.1 mmol/L (ref 3.5–5.1)
Sodium: 133 mmol/L — ABNORMAL LOW (ref 135–145)

## 2021-11-12 MED ORDER — PREDNISONE 5 MG PO TABS: 10.0000 mg | ORAL_TABLET | Freq: Every day | ORAL | Status: DC

## 2021-11-12 MED ORDER — PREDNISONE 20 MG PO TABS
40.0000 mg | ORAL_TABLET | Freq: Every day | ORAL | Status: AC
  Administered 2021-11-12 – 2021-11-16 (×5): 40 mg via ORAL
  Filled 2021-11-12 (×5): qty 2

## 2021-11-12 MED ORDER — METOPROLOL TARTRATE 25 MG PO TABS
50.0000 mg | ORAL_TABLET | Freq: Two times a day (BID) | ORAL | Status: DC
  Administered 2021-11-12 – 2021-11-13 (×3): 50 mg via ORAL
  Filled 2021-11-12 (×3): qty 2

## 2021-11-12 MED ORDER — PREDNISONE 20 MG PO TABS
20.0000 mg | ORAL_TABLET | Freq: Every day | ORAL | Status: DC
Start: 2021-11-24 — End: 2021-11-21

## 2021-11-12 MED ORDER — PREDNISONE 5 MG PO TABS: 5.0000 mg | ORAL_TABLET | Freq: Every day | ORAL | Status: DC

## 2021-11-12 MED ORDER — PREDNISONE 5 MG PO TABS
30.0000 mg | ORAL_TABLET | Freq: Every day | ORAL | Status: DC
  Administered 2021-11-17 – 2021-11-20 (×4): 30 mg via ORAL
  Filled 2021-11-12 (×4): qty 2

## 2021-11-12 NOTE — Telephone Encounter (Signed)
Please schedule hospital follow up for pneumonitis/pneumonia in 2 weeks with Dr. Valeta Harms or an APP. Please let me know date/time so I can add it to his discharge paperwork.  Thanks, JD

## 2021-11-12 NOTE — Progress Notes (Signed)
NAME:  Corey [name], MRN:  [id number], DOB:  Jul 03, 1946, LOS: 9 ADMISSION DATE:  11/03/2021, CONSULTATION DATE:  11/12/21 REFERRING MD:  Tenny Craw, CHIEF COMPLAINT:  Shortness of breath   History of Present Illness:  Corey [name] is a 76 y.o. 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  Cultures have been negative, legionella and strep pneumo also negative.   He was initially started on Vanc and Cefepime, MRSA negative so continued on Cefepime.  PCCM consulted in this setting.    Since admission pt has been intermittently in Atrial fibrillation with RVR despite Cardizem, cardiology evaluated and suspect secondary to acute illness.   Overnight on  2/6, patient was noted to have slurred speech and dysarthria and code code stroke was called.   Imaging revealed no acute infarcts or evidence of LVO.   This morning is back to neurologic baseline.   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   Significant  Hospital Events: Including procedures, antibiotic start and stop dates in addition to other pertinent events   1/31 Admitted to hospital medicine service with weakness and CAP  2/6 concern for acute stroke, imaging negative 2/7 PCCM consult, started on prednisone and digoxin   Interim History / Subjective:   Patient feeling better this morning. HR remains controlled since digoxin initiation.   Had meeting with palliative care yesterday.  He is considering home with home PT or going to acute rehab facility  Objective   Blood pressure 118/69, pulse 94, temperature (!) 97.2 F (36.2 C), temperature source Axillary, resp. rate (!) 24, SpO2 98 %.    FiO2 (%):  [32 %] 32 %   Intake/Output Summary (Last 24 hours) at 11/12/2021 0813 Last data filed at 11/12/2021 0700 Gross per 24 hour  Intake 1289.3 ml  Output 3600 ml  Net -2310.7 ml   There were no vitals filed for this visit.  General:  elderly gentleman, awake, conversational and in no acute distress HEENT: MM pink/dry, pupils equal, sclera anicteric Neuro: awake, oriented, moving all extremities  CV: s1s2 rrr, no m/r/g PULM:  course breath sounds on left, improved aeration. Clear on right. No wheezing or rhonchi. GI: soft, bsx4 active  Extremities: warm/dry, no edema, non-tender Skin: no rashes or lesions   Resolved Hospital Problem list     Assessment & Plan:    Acute Hypoxic Respiratory Failure  Extensive irregular opacities L lung, likely CAP vs post-obstructive pneuonia vs pneumonitis 2/2 chemo on top of underlying lung cancer Stage IV non-small cell lung ca - continue flagyl for total of 5 day course and cefepime - Prednisone taper entered. 40mg  daily and taper down every 7 days by 10mg . - Continue  brovana + yupelri nebulizer treatments while inpatient. Can go back to stiolto inhaler upon discharge with his pulmicort neb. - Less concern for PE given his treatment with xarelto  PCCM will sign off. I will arrange  follow up in pulmonary clinic in 2 weeks.  Best Practice (right click and "Reselect all SmartList Selections" daily)   Per primary  Labs   CBC: Recent Labs  Lab 11/08/21 0500 11/09/21 0445 11/10/21 0050 11/10/21 0316 11/11/21 0242 11/12/21 0400  WBC 3.4* 6.4 7.4 7.6 7.1 7.6  NEUTROABS 2.5 4.7  --  5.6 5.7 6.1  HGB 7.6* 8.2* 8.4* 8.0* 8.4* 8.4*  HCT 23.3* 25.3* 25.1* 24.4* 25.3* 26.1*  MCV 103.6* 103.7* 102.0* 102.5* 103.3* 104.0*  PLT 58* 65* 72* 71* 93* 127*    Basic Metabolic Panel: Recent Labs  Lab 11/08/21 0500 11/09/21 0445 11/10/21 0050 11/10/21 0316 11/11/21 0242 11/12/21 0400  NA 128* 128* 129* 129* 131* 133*  K 3.5 3.7 3.4* 3.5 4.0 4.1  CL 95* 95* 93* 94* 96* 97*  CO2 25 26 27 28 29 30   GLUCOSE 118* 119* 113* 111* 137* 131*  BUN 9 9 10 10 12 11   CREATININE 0.76 0.75 0.78 0.83 0.72 0.74  CALCIUM 8.4* 8.4* 8.5* 8.5* 8.7* 8.7*  MG 2.0 1.7  --  1.6* 2.0 1.9   GFR: CrCl cannot be calculated (Unknown ideal weight.). Recent Labs  Lab 11/10/21 0050 11/10/21 0316 11/11/21 0242 11/12/21 0400  PROCALCITON  --  0.27  --   --   WBC 7.4 7.6 7.1 7.6    Liver Function Tests: Recent Labs  Lab 11/10/21 0050  AST 24  ALT 5  ALKPHOS 49  BILITOT 0.7  PROT 6.0*  ALBUMIN 2.7*   No results for input(s): LIPASE, AMYLASE in the last 168 hours. No results for input(s): AMMONIA in the last 168 hours.  ABG    Component Value Date/Time   PHART 7.411 08/01/2021 1530   PCO2ART 31.9 (L) 08/01/2021 1530   PO2ART 114 (H) 08/01/2021 1530   HCO3 19.9 (L) 08/01/2021 1530   TCO2 26 10/31/2012 1253   ACIDBASEDEF 3.7 (H) 08/01/2021 1530   O2SAT 99.0 08/01/2021 1530     Coagulation Profile: Recent Labs  Lab 11/10/21 0050  INR 2.6*    Cardiac Enzymes: No results for input(s): CKTOTAL, CKMB, CKMBINDEX, TROPONINI in the last 168 hours.  HbA1C: Hgb A1c MFr Bld  Date/Time Value Ref Range Status  07/07/2012 05:30 AM 5.9 (H) <5.7 % Final    Comment:    (NOTE)                                                                        According to the ADA Clinical Practice Recommendations for 2011, when HbA1c is used as a screening test:  >=6.5%   Diagnostic of Diabetes Mellitus           (if abnormal result is confirmed) 5.7-6.4%   Increased risk of developing Diabetes Mellitus References:Diagnosis and Classification of Diabetes Mellitus,Diabetes SVXB,9390,30(SPQZR 1):S62-S69 and Standards of Medical Care in         Diabetes - 2011,Diabetes AQTM,2263,33 (Suppl 1):S11-S61.  07/06/2012 12:24 AM 6.0 (H) <5.7 % Final    Comment:    (  NOTE)                                                                       According to the ADA Clinical Practice Recommendations for 2011, when HbA1c is used as a screening test:  >=6.5%   Diagnostic of Diabetes Mellitus           (if abnormal result is confirmed) 5.7-6.4%   Increased risk of developing Diabetes Mellitus References:Diagnosis and Classification of Diabetes Mellitus,Diabetes ASNK,5397,67(HALPF 1):S62-S69 and Standards of Medical Care in         Diabetes - 2011,Diabetes XTKW,4097,35 (Suppl 1):S11-S61.    CBG: Recent Labs  Lab 11/10/21 0037  GLUCAP 105*    Critical care time: n/a     Freda Jackson, MD [facility] Office: [phone]   See Amion for personal pager PCCM on call pager [phone] until 7pm. Please call Elink 7p-7a. [phone]

## 2021-11-12 NOTE — Progress Notes (Signed)
Progress Note   Patient: Corey [name]:[id number] DOB: [birth year] DOA: 11/03/2021     9 DOS: the patient was seen and examined on 11/12/2021   Brief hospital course: Corey [name] is a 76 y.o. male with PMH stage IV NSCLC with liver mets, PAF, CAD, chronic low back p ain, anemia, anxiety, osteoarthritis, HLD, HTN, GERD, OSA on CPAP, prostate cancer who presented with approximately 4 days of progressively worsening weakness and ongoing productive cough of yellow sputum with associated pleuritic chest pain, malaise, dyspnea.  He recently deceived chemotherapy within the past week as well.  Due to continued symptoms of feeling poorly, he presented for further evaluation. On evaluation in the ER, he underwent CXR which showed ongoing extensive irregular opacities involving the left lung not significantly changed from prior imaging.  Prior CT chest on 10/15/2021 showed a stable dense consolidation in the lingula and left lower lobe. Notable labs included WBC 2.8, platelet count 158.  Sodium 128.  BNP 187. There was concern for possible pneumonia and he was started on vancomycin and cefepime. He also developed A. fib with RVR and was started on a Cardizem drip.  Assessment and Plan: * Atrial fibrillation with RVR ([place])- (present on admission) - on Tikosyn, Toprol, and Xarelto at home - Patient noted to be in A. fib with RVR on admission - cardiology consulted for further assistance given complexity of med regimen and prolonged QTc - continue amio per cardiology. Also started on digoxin trial on 2/7. On dilt gtt for now, plan to wean to PO likely tomorrow -Pt on amiodarone gtt with possible transition to PO in day or two - may need consideration of DCCV but unfortunately does have multiple chronic issues notably cancer; palliative care has been consulted as well  - follow up cardiology rec's and changes -metoprolol increased to 50mg  bid per Cardiology -Xarelto continued  Dysarthria - acutely happened  overnight of 2/6 with concern for a stroke. 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 -Follow-up MRI brain and if no obvious contraindication, now on Xarelto today  Stasis dermatitis - more prominent on LLE; no overt cellulitis signs as nontender as well - B/L LE duplex negative for DVT as well, performed 11/06/21 - likely caused by ongoing LE edema - some improvement in LE edema with lasix as well; will try and apply TED hose soon too   Prolonged Q-T interval on ECG - tikosyn now officially discontinued per cardiology - continue monitoring electrolytes; goal K>4, Mg>2  Macrocytic anemia- (present on admission) - no overt bleeding - downtrend suspected in setting of chemo recently   Leukopenia- (present on admission) - likely multifactorial from underlying infection plus recent chemo  -resolved  CAP (community acquired pneumonia)- (present on admission) - ongoing LLL consolidation which may be acutely worse on admission; symptomatically he felt worse over the past ~1 week prior to admission - s/p vanc and cefepime  - to complete flagyl on 2/10 - flagyl added 2/6 and will treat x 5 days per pulm - follow up cultures (blood cultures remain negative)  Non-small cell carcinoma of left lung, stage 4 ([place])- (present on admission) - follows outpatient with Dr. [name] -Palliative  Care consulted -This AM, pt reports having the "will" to proceed with therapy moving forward - Palliative Care following  Hyponatremia- (present on admission) - appeared clinically volume depleted on admission - unfortunately edema was a big issue in LE; given hypoalbuminemia he is likely 3rd  spacing. FeNa calculated <<1% on 11/06/21 consistent with intravascular volume depletion and worsening hyponatremia at that time - pt is s/p albumin - Na improving with albumin and lasix; cardiology also giving this intermittently  - repeat BMP in AM  OSA on CPAP - continue nightly CPAP  GERD (gastroesophageal reflux disease)- (present on admission) - continue PPI  Dyslipidemia- (present on admission) - Continue Lipitor  Benign essential HTN- (present on admission) - BP stable and controlled at this time - continue monitoring   Coronary artery disease- (present on admission) - Continue Lipitor, BB -On Xarelto for underlying A. fib  Sepsis (HCC)-resolved as of 11/08/2021, (present on admission) - neutropenia, tachycardia, tachypnea, lung source suspected - see pna treatment      Subjective: Reports feeling better today  Physical Exam: Vitals:   11/12/21 1100 11/12/21 1200 11/12/21 1300 11/12/21 1400  BP: (!) 78/62 122/65  126/70  Pulse: 83 79 77 80  Resp: 19 (!) 21 20 (!) 21  Temp:  98 F (36.7 C)    TempSrc:  Oral    SpO2: 97% 95% 98% 100%   General exam: Conversant, in no acute distress Respiratory system: normal chest rise, clear, no audible wheezing Cardiovascular system: regular rhythm, s1-s2 Gastrointestinal system: Nondistended, nontender, pos BS Central nervous system: No seizures, no tremors Extremities: No cyanosis, no joint deformities Skin: No rashes, no pallor Psychiatry: Affect normal // no auditory hallucinations   Data Reviewed:  Labs reviewed  Family Communication: Pt in room, family not at bedside  Disposition: Status is: Inpatient Remains inpatient appropriate because: Severity of illness    Planned Discharge Destination: Barriers to discharge: amiodarone gtt, dilt gtt, cardiology cont to follow      Author: Marylu Lund, MD 11/12/2021 2:46 PM  For on call review [email or web address]

## 2021-11-12 NOTE — Progress Notes (Addendum)
Progress Note  Patient Name: Corey [name] Date of Encounter: 11/12/2021  [facility] Cardiologist: Mertie Moores, MD   Subjective   No acute overnight events. Patient states he is still having some labored breathing usually associated with coughing but states he feels a little better today. Cough improving with Prednisone. He is currently on 3L of O2 via nasal cannula (on 4L at home). No chest pain. No palpitations. Remains in atrial fibrillation with rates in the 80s to low 100s.   Patient states he is more emotional today. He had conversations with palliative care yesterday and is leaning towards no more chemotherapy for his stage IV lung cancer.   Inpatient Medications    Scheduled Meds:  acidophilus  1 capsule Oral Daily   arformoterol  15 mcg Nebulization BID   atorvastatin  40 mg Oral QHS   budesonide  0.5 mg Nebulization BID   carbidopa-levodopa  1 tablet Oral 4 times per day   Carbinoxamine Maleate  6 mg Oral BID   Chlorhexidine Gluconate Cloth  6 each Topical Daily   feeding supplement  237 mL Oral TID BM   fenofibrate  160 mg Oral Q1400   fluticasone  2 spray Each Nare Daily   folic acid  1 mg Oral Daily   guaiFENesin  1,200 mg Oral BID   ipratropium  2 spray Each Nare q AM   lactulose  10 g Oral BID   levalbuterol  0.63 mg Nebulization BID   mouth rinse  15 mL Mouth Rinse BID   metoprolol tartrate  50 mg Oral BID   multivitamin with minerals  1 tablet Oral Daily   pantoprazole  40 mg Oral Daily   polyethylene glycol  17 g Oral BID   predniSONE  40 mg Oral Q breakfast   Followed by   Derrill Memo ON 11/17/2021] predniSONE  30 mg Oral Q breakfast   Followed by   Derrill Memo ON 11/24/2021] predniSONE  20 mg Oral Q breakfast   Followed by   Derrill Memo ON 12/01/2021] predniSONE  10 mg Oral Q breakfast   Followed by   Derrill Memo ON 12/08/2021] predniSONE  5 mg Oral Q breakfast   revefenacin  175 mcg Nebulization Daily   rivaroxaban  20 mg Oral QPM   senna-docusate  1 tablet Oral BID    sodium chloride flush  3 mL Intravenous Q12H   sorbitol  30 mL Oral Daily   tamsulosin  0.4 mg Oral QPC supper   topiramate  100 mg Oral BID   Continuous Infusions:  sodium chloride Stopped (11/06/21 1119)   sodium chloride Stopped (11/10/21 0152)   sodium chloride 10 mL/hr at 11/12/21 0700   amiodarone 30 mg/hr (11/12/21 0700)   diltiazem (CARDIZEM) infusion 10 mg/hr (11/12/21 0700)   metronidazole 500 mg (11/12/21 0753)   PRN Meds: sodium chloride, sodium chloride, acetaminophen **OR** acetaminophen, ALPRAZolam, alum & mag hydroxide-simeth, benzonatate, dextromethorphan, levalbuterol, lip balm, metoCLOPramide (REGLAN) injection, nitroGLYCERIN, sodium chloride flush   Vital Signs    Vitals:   11/12/21 0700 11/12/21 0734 11/12/21 0736 11/12/21 0800  BP: 118/69   135/71  Pulse: 94   92  Resp: (!) 24   (!) 21  Temp:   97.8 F (36.6 C) 97.8 F (36.6 C)  TempSrc:   Axillary Axillary  SpO2: 96% 97% 98% 98%    Intake/Output Summary (Last 24 hours) at 11/12/2021 0903 Last data filed at 11/12/2021 0700 Gross per 24 hour  Intake 1219.03 ml  Output 3600  ml  Net -2380.97 ml   Last 3 Weights 10/28/2021 10/23/2021 10/20/2021  Weight (lbs) 228 lb 8 oz 229 lb 12.8 oz 229 lb 1.6 oz  Weight (kg) 103.647 kg 104.237 kg 103.919 kg      Telemetry    Atrial fibrillation with rates in the 80s to low 100s (mostly in the 90s). - Personally Reviewed  ECG    No new ECG tracing today. - Personally Reviewed  Physical Exam   GEN: Caucasian male resting comfortably in no acute distress.  On 3L of O2 via nasal cannula. Neck: No JVD. Cardiac: Borderline tachycardia with irregularly irregular rhythm. No murmurs, rubs, or gallops. Radial pulses 2+ and equal bilaterally. Respiratory: No increased work of breathing. Mild rhonchi in bilateral bases. GI: Soft, non-distended, and non-tender. MS: Trace to 1+ pitting edema of bilateral lower extremities. No deformity. Skin: Warm and dry. Neuro:  No  focal deficits. Psych: Normal affect. Responds appropriately.  Labs    High Sensitivity Troponin:  No results for input(s): TROPONINIHS in the last 720 hours.   Chemistry Recent Labs  Lab 11/10/21 0050 11/10/21 0316 11/11/21 0242 11/12/21 0400  NA 129* 129* 131* 133*  K 3.4* 3.5 4.0 4.1  CL 93* 94* 96* 97*  CO2 27 28 29 30   GLUCOSE 113* 111* 137* 131*  BUN 10 10 12 11   CREATININE 0.78 0.83 0.72 0.74  CALCIUM 8.5* 8.5* 8.7* 8.7*  MG  --  1.6* 2.0 1.9  PROT 6.0*  --   --   --   ALBUMIN 2.7*  --   --   --   AST 24  --   --   --   ALT 5  --   --   --   ALKPHOS 49  --   --   --   BILITOT 0.7  --   --   --   GFRNONAA >60 >60 >60 >60  ANIONGAP 9 7 6 6     Lipids No results for input(s): CHOL, TRIG, HDL, LABVLDL, LDLCALC, CHOLHDL in the last 168 hours.  Hematology Recent Labs  Lab 11/10/21 0316 11/11/21 0242 11/12/21 0400  WBC 7.6 7.1 7.6  RBC 2.38* 2.45* 2.51*  HGB 8.0* 8.4* 8.4*  HCT 24.4* 25.3* 26.1*  MCV 102.5* 103.3* 104.0*  MCH 33.6 34.3* 33.5  MCHC 32.8 33.2 32.2  RDW 14.8 14.6 14.6  PLT 71* 93* 127*   Thyroid No results for input(s): TSH, FREET4 in the last 168 hours.  BNPNo results for input(s): BNP, PROBNP in the last 168 hours.  DDimer No results for input(s): DDIMER in the last 168 hours.   Radiology    MR BRAIN W WO CONTRAST  Result Date: 11/11/2021 CLINICAL DATA:  Follow-up examination for stroke. EXAM: MRI HEAD WITHOUT AND WITH CONTRAST TECHNIQUE: Multiplanar, multiecho pulse sequences of the brain and surrounding structures were obtained without and with intravenous contrast. CONTRAST:  19mL GADAVIST GADOBUTROL 1 MMOL/ML IV SOLN COMPARISON:  Prior CT from earlier the same day. FINDINGS: Brain: Examination moderately degraded by motion artifact. Generalized age-related cerebral atrophy. Patchy and confluent T2/FLAIR hyperintensity involving the periventricular deep white matter both cerebral hemispheres most consistent with chronic small vessel ischemic  disease, mild to moderate in nature. Area of encephalomalacia and gliosis involving the left parietal lobe, consistent with a chronic ischemic infarct, posterior left MCA distribution. No abnormal foci of restricted diffusion to suggest acute or subacute ischemia. Gray-white matter differentiation otherwise maintained. No other areas of chronic cortical infarction. No  visible acute or chronic intracranial blood products. No mass lesion, midline shift or mass effect. Mild ventricular prominence related to global parenchymal volume loss without hydrocephalus. No extra-axial fluid collection. Partially empty sella noted. Midline structures intact. No visible abnormal enhancement. Vascular: Major intracranial vascular flow voids are maintained. Skull and upper cervical spine: Craniocervical junction within normal limits. Bone marrow signal intensity grossly normal. No scalp soft tissue abnormality. Sinuses/Orbits: Globes and orbital soft tissues demonstrate no acute finding. Mild mucosal thickening noted within the ethmoidal air cells. Paranasal sinuses are otherwise clear. Trace right mastoid effusion noted, of doubtful significance. Other: None. IMPRESSION: 1. No acute intracranial infarct or other abnormality. 2. Chronic left parietal infarct. 3. Underlying mild to moderate chronic microvascular ischemic disease. Electronically Signed   By: Jeannine Boga M.D.   On: 11/11/2021 00:32    Cardiac Studies   Echocardiogram 08/05/2021: Impressions:  1. Left ventricular ejection fraction, by estimation, is 65 to 70%. The  left ventricle has normal function. The left ventricle has no regional  wall motion abnormalities. There is mild left ventricular hypertrophy.  Left ventricular diastolic parameters  are indeterminate.   2. Right ventricular systolic function mild to moderately reduced. The  right ventricular size is mildly enlarged. There is normal pulmonary  artery systolic pressure.   3. Trivial  mitral valve regurgitation.   4. The aortic valve is tricuspid. Aortic valve regurgitation is not  visualized. Mild aortic valve sclerosis is present, with no evidence of  aortic valve stenosis.   5. Aortic dilatation noted. There is mild dilatation of the ascending  aorta, measuring 40 mm.   6. The inferior vena cava is normal in size with greater than 50%  respiratory variability, suggesting right atrial pressure of 3 mmHg.   Patient Profile     76 y.o. 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 Cardiology consulted for management of atrial fibrillation.  Assessment & Plan    Paroxysmal Atrial fibrillation with RVR Recent ILR with 0% Afib and thus suspect RVR now driven by acute ilness. Tikosyn stopped this admission due to prolonged QT and hyponatremia. He was placed on IV Amiodarone and then temporarily on PO Amiodarone but developed recurrent RVR and therefore Amiodarone drip was restarted. Anticoagulation was held due to acute stroke.  - Remains in atrial fibrillation with rates in the 80s to low 100s (mostly in the 90s). - Currently on IV Cardizem 10mg /hr. Will increase beta-blocker and try to wean this off. - Currently on IV Amiodarone. Continue for now. - Will increase Lopressor to 50mg  twice daily. BP stable and will allow this. - He completed Digoxin load. Can add oral Digoxin if  needed. - Xarelto was restarted on 2/8. - Can consider DCCV in the future if he remains in atrial fibrillation; however, given acute illness, he is unlikely to maintain rhythm at this time.  Concern for TIA Patient had an episode of expressive aphasia on 2/6  and there was concern for acute  stroke vs TIA. Head CT showed not acute abnormality. Head/neck CTA showed 27-03% LICA but no large vessel occlusion. Head MRI showed no acute intracranial infarct but with chronic left parietal infarct and mild-to-moderate chronic microvascular disease. Symptoms have since resolved. - Xarelto was restarted on 2/8. - Recommended for vascular evaluation for 50-09% LICA.   Possible PE noted on CTA Noted to have possible filling defect on CTA head and neck that was obtained due to expressive aphasia as above. -Will need dedicated CTA chest once able. - Xarelto restarted on 2/8.   CAD S/p PCI to RCA in 2004 and 2007.  - No chest pain. - No aspirin due to need for Xarelto. - Continue beat-blocker and statin.   Suspected Diastolic HF RV Dysfunction Lower Extremity Edema Most recent Echo in 08/2021 showed LVEF of 65-70% with normal wall motion, mild LVH, and indeterminate diastolic parameters as well as mildly enalrged RV with mild to moderately reduced RV systolic function. Low albumin likely contributing to third spacing. Also may be related to mild CHF in the setting of atrial fibrillation with RVR. He has intermittently been treated with albumin and Lasix. Last dose of IV Lasix was 2/6. Net negative 13.4 L this admission. No documented weights this admission. - Does not appear grossly volume overloaded on exam. - Can continue as needed Lasix. - Continue compression stockings.  Possible CAP Lung cancer stage IV Completed 8 day course of Cefepime and now on 5 day course of Flagyl. Also being treated with Prednisone for pneumonitis.  - He was seen by Palliative Care yesterday and patient is leaning towards  plan for no more chemotherapy.  - Management per critical care.   Hyponatremia and pancytopenia: Likely related to underlying malignancy.  - Management per primary team.  For questions or updates, please contact [place] Please consult [email or web address] for contact info under        Signed, Darreld Mclean, PA-C  11/12/2021, 9:03 AM    Patient seen and examined and agree with Sande Rives, PA-C as detailed above.  In brief, the patient is a  75 y.o. 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 Cardiology consulted for management of atrial fibrillation.  Currently, HR remain better controlled but still requiring dilt gtt, amiodarone gtt, and metoprolol for rate control. Xarelto restarted given reassuring neurological imaging. Will continue with current therapies for now and plan to transition to PO dilt/amio tomorrow vs Saturday pending continued clinical improvement. Ultimately, can consider TEE/DCCV once more recovered from a pulmonary standpoint.  GEN: Sitting up in a chair, mildly dyspneic   Neck: No JVD Cardiac: Irregular, no murmurs  Respiratory: Scattered wheezes and rhonci GI: Soft, nontender, non-distended  MS: Trace edema Neuro:  Nonfocal  Psych: Normal affect    Plan: -Continue amiodarone gtt; likely plan to transition to PO tomorrow -Continue dilt gtt; wean as able  and hopefully transition to PO tomorrow vs Sat -Will increase metop to 50mg  BID -Can add oral  digoxin if needed -Xarelto resumed -Goal to transition to PO dilt/amio tomorrow vs Saturday -Can consider TEE/DCCV in the future if remains in afib once recovered from acute illness -Management of pneumonia vs pneumonitis and stage IV lung cancer per primary and CCM  Gwyndolyn Kaufman, MD

## 2021-11-12 NOTE — Telephone Encounter (Signed)
Patient has been scheduled for a HFU with TP on 11/26/21 at 1130am. Dr. Erin Fulling is aware.   Will close encounter.

## 2021-11-12 NOTE — Progress Notes (Signed)
Chaplain engaged in a follow-up visit with Wille Glaser, Mrs. Noon, and their daughter yesterday.  Chaplain spent some time catching up with them.  Henok appeared to be in good spirits as he was shaving his face.  Mrs. Scheck stated that Marlee had a hard day previously due to the switches in his medicine so it was good to see him feeling better today.  Julian had the urge to take care of his outward appearance while he was feeling well. The room felt very lighthearted even after they had spent some time talking to Palliative Care.  They voiced that they will see Dr. Domingo Cocking again on Saturday as they see how Wladyslaw does with the changes in his medication.  Mrs. Marseille voiced that the hope is to get him to rehab.    Chaplain has also experienced the [facility] family as having [place]'s best interest at heart.  They have been great at asking questions and weighing all options.  In a previous visit they voiced wanting to be able to choose all interventions that are available to [place] while also understanding Jett's limits.   Chaplain continues to offer presence, listening and support.     11/12/21 1000  Clinical Encounter Type  Visited With Patient and family together  Visit Type Follow-up

## 2021-11-12 NOTE — Plan of Care (Signed)

## 2021-11-13 DIAGNOSIS — I4891 Unspecified atrial fibrillation: Secondary | ICD-10-CM | POA: Diagnosis not present

## 2021-11-13 DIAGNOSIS — I10 Essential (primary) hypertension: Secondary | ICD-10-CM | POA: Diagnosis not present

## 2021-11-13 LAB — CBC WITH DIFFERENTIAL/PLATELET
Abs Immature Granulocytes: 0.56 10*3/uL — ABNORMAL HIGH (ref 0.00–0.07)
Basophils Absolute: 0 10*3/uL (ref 0.0–0.1)
Basophils Relative: 0 %
Eosinophils Absolute: 0 10*3/uL (ref 0.0–0.5)
Eosinophils Relative: 0 %
HCT: 26.9 % — ABNORMAL LOW (ref 39.0–52.0)
Hemoglobin: 8.7 g/dL — ABNORMAL LOW (ref 13.0–17.0)
Immature Granulocytes: 7 %
Lymphocytes Relative: 8 %
Lymphs Abs: 0.6 10*3/uL — ABNORMAL LOW (ref 0.7–4.0)
MCH: 33.9 pg (ref 26.0–34.0)
MCHC: 32.3 g/dL (ref 30.0–36.0)
MCV: 104.7 fL — ABNORMAL HIGH (ref 80.0–100.0)
Monocytes Absolute: 0.9 10*3/uL (ref 0.1–1.0)
Monocytes Relative: 11 %
Neutro Abs: 6.3 10*3/uL (ref 1.7–7.7)
Neutrophils Relative %: 74 %
Platelets: 150 10*3/uL (ref 150–400)
RBC: 2.57 MIL/uL — ABNORMAL LOW (ref 4.22–5.81)
RDW: 14.9 % (ref 11.5–15.5)
WBC: 8.5 10*3/uL (ref 4.0–10.5)
nRBC: 0.2 % (ref 0.0–0.2)

## 2021-11-13 LAB — BASIC METABOLIC PANEL
Anion gap: 7 (ref 5–15)
BUN: 16 mg/dL (ref 8–23)
CO2: 28 mmol/L (ref 22–32)
Calcium: 8.8 mg/dL — ABNORMAL LOW (ref 8.9–10.3)
Chloride: 97 mmol/L — ABNORMAL LOW (ref 98–111)
Creatinine, Ser: 0.82 mg/dL (ref 0.61–1.24)
GFR, Estimated: >60 mL/min (ref >60)
Glucose, Bld: 114 mg/dL — ABNORMAL HIGH (ref 70–99)
Potassium: 4.3 mmol/L (ref 3.5–5.1)
Sodium: 132 mmol/L — ABNORMAL LOW (ref 135–145)

## 2021-11-13 LAB — MAGNESIUM: Magnesium: 1.9 mg/dL (ref 1.7–2.4)

## 2021-11-13 MED ORDER — AMIODARONE HCL 200 MG PO TABS
400.0000 mg | ORAL_TABLET | Freq: Two times a day (BID) | ORAL | Status: DC
  Administered 2021-11-13 – 2021-11-14 (×4): 400 mg via ORAL
  Filled 2021-11-13 (×4): qty 2

## 2021-11-13 MED ORDER — METOPROLOL TARTRATE 25 MG PO TABS
75.0000 mg | ORAL_TABLET | Freq: Two times a day (BID) | ORAL | Status: DC
Start: 2021-11-13 — End: 2021-11-13

## 2021-11-13 MED ORDER — METOPROLOL TARTRATE 50 MG PO TABS
100.0000 mg | ORAL_TABLET | Freq: Two times a day (BID) | ORAL | Status: DC
  Administered 2021-11-13 – 2021-11-19 (×13): 100 mg via ORAL
  Filled 2021-11-13 (×4): qty 2
  Filled 2021-11-13: qty 4
  Filled 2021-11-13: qty 2
  Filled 2021-11-13: qty 4
  Filled 2021-11-13: qty 2
  Filled 2021-11-13: qty 4
  Filled 2021-11-13 (×3): qty 2
  Filled 2021-11-13: qty 4

## 2021-11-13 NOTE — Progress Notes (Signed)
Progress Note  Patient Name: Corey [name] Date of Encounter: 11/14/2021  [facility] Cardiologist: Mertie Moores, MD   Subjective   Feeling better. Breathing slowly improving.  HR better controlled 70-90s Dilt gtt weaned to 5 Amio gtt stopped yesterday and transitioned to amiodarone 400mg  BID  Inpatient Medications    Scheduled Meds:  acidophilus  1 capsule Oral Daily   amiodarone  400 mg Oral BID   arformoterol  15 mcg Nebulization BID   atorvastatin  40 mg Oral QHS   budesonide  0.5 mg Nebulization BID   carbidopa-levodopa  1 tablet Oral 4 times per day   Carbinoxamine Maleate  6 mg Oral BID   Chlorhexidine Gluconate Cloth  6 each Topical Daily   feeding supplement  237 mL Oral TID BM   fenofibrate  160 mg Oral Q1400   fluticasone  2 spray Each Nare Daily   folic acid  1 mg Oral Daily   guaiFENesin  1,200 mg Oral BID   ipratropium  2 spray Each Nare q AM   lactulose  10 g Oral BID   levalbuterol  0.63 mg Nebulization BID   mouth rinse  15 mL Mouth Rinse BID   metoprolol tartrate  100 mg Oral BID   multivitamin with minerals  1 tablet Oral Daily   pantoprazole  40 mg Oral Daily   polyethylene glycol  17 g Oral BID   predniSONE  40 mg Oral Q breakfast   Followed by   Derrill Memo ON 11/17/2021] predniSONE  30 mg Oral Q breakfast   Followed by   Derrill Memo ON 11/24/2021] predniSONE  20 mg Oral Q breakfast   Followed by   Derrill Memo ON 12/01/2021] predniSONE  10 mg Oral Q breakfast   Followed by   Derrill Memo ON 12/08/2021] predniSONE  5 mg Oral Q breakfast   revefenacin  175 mcg Nebulization Daily   rivaroxaban  20 mg Oral QPM   senna-docusate  1 tablet Oral BID   sodium chloride flush  3 mL Intravenous Q12H   sorbitol  30 mL Oral Daily   tamsulosin  0.4 mg Oral QPC supper   topiramate  100 mg Oral BID   Continuous Infusions:  sodium chloride Stopped (11/06/21 1119)   sodium chloride Stopped (11/10/21 0152)   sodium chloride Stopped (11/13/21 1400)   diltiazem (CARDIZEM)  infusion 10 mg/hr (11/14/21 0600)   PRN Meds: sodium chloride, sodium chloride, acetaminophen **OR** acetaminophen, ALPRAZolam, alum & mag hydroxide-simeth, benzonatate, dextromethorphan, levalbuterol, lip balm, metoCLOPramide (REGLAN) injection, nitroGLYCERIN, sodium chloride flush   Vital Signs    Vitals:   11/14/21 0400 11/14/21 0406 11/14/21 0500 11/14/21 0600  BP: 122/68  119/67 135/71  Pulse: 81 84 93 (!) 46  Resp: 15 15 20 15   Temp: 97.8 F (36.6 C) 97.8 F (36.6 C)    TempSrc: Oral Axillary    SpO2: 99% 99% 97% 99%    Intake/Output Summary (Last 24 hours) at 11/14/2021 0643 Last data filed at 11/14/2021 0600 Gross per 24 hour  Intake 1044.93 ml  Output 4800 ml  Net -3755.07 ml   Last 3 Weights 10/28/2021 10/23/2021 10/20/2021  Weight (lbs) 228 lb 8 oz 229 lb 12.8 oz 229 lb 1.6 oz  Weight (kg) 103.647 kg 104.237 kg 103.919 kg      Telemetry    Afib with HR better controlled 60-100s- Personally Reviewed  ECG    No new tracing- Personally Reviewed  Physical Exam   GEN: No acute distress.  Neck: No JVD Cardiac: Irregularly, irregular Respiratory: Scattered rhonchi, better air movement than prior GI: Soft, nontender, non-distended  MS: Trace-1+ edema, warm Neuro:  Nonfocal  Psych: Normal affect   Labs    High Sensitivity Troponin:  No results for input(s): TROPONINIHS in the last 720 hours.   Chemistry Recent Labs  Lab 11/10/21 0050 11/10/21 0316 11/11/21 0242 11/12/21 0400 11/13/21 0310  NA 129*   < > 131* 133* 132*  K 3.4*   < > 4.0 4.1 4.3  CL 93*   < > 96* 97* 97*  CO2 27   < > 29 30 28   GLUCOSE 113*   < > 137* 131* 114*  BUN 10   < > 12 11 16   CREATININE 0.78   < > 0.72 0.74 0.82  CALCIUM 8.5*   < > 8.7* 8.7* 8.8*  MG  --    < > 2.0 1.9 1.9  PROT 6.0*  --   --   --   --   ALBUMIN 2.7*  --   --   --   --   AST 24  --   --   --   --   ALT 5  --   --   --   --   ALKPHOS 49  --   --   --   --   BILITOT 0.7  --   --   --   --   GFRNONAA >60    < > >60 >60 >60  ANIONGAP 9   < > 6 6 7    < > = values in this interval not displayed.    Lipids No results for input(s): CHOL, TRIG, HDL, LABVLDL, LDLCALC, CHOLHDL in the last 168 hours.  Hematology Recent Labs  Lab 11/12/21 0400 11/13/21 0310 11/14/21 0505  WBC 7.6 8.5 8.1  RBC 2.51* 2.57* 2.66*  HGB 8.4* 8.7* 9.0*  HCT 26.1* 26.9* 28.1*  MCV 104.0* 104.7* 105.6*  MCH 33.5 33.9 33.8  MCHC 32.2 32.3 32.0  RDW 14.6 14.9 15.1  PLT 127* 150 185   Thyroid No results for input(s): TSH, FREET4 in the last 168 hours.  BNP No results for input(s): BNP, PROBNP in the last 168 hours.   DDimer No results for input(s): DDIMER in the last 168 hours.   Radiology    No results found.  Cardiac Studies   2D echo 08/2021  1. Left ventricular ejection fraction, by estimation, is 65 to 70%. The  left ventricle has normal function. The left ventricle has no regional  wall motion abnormalities. There is mild left ventricular hypertrophy.  Left ventricular diastolic parameters  are indeterminate.   2. Right ventricular systolic function mild to moderately reduced. The  right ventricular size is mildly enlarged. There is normal pulmonary  artery systolic pressure.   3. Trivial mitral valve regurgitation.   4. The aortic valve is tricuspid. Aortic valve regurgitation is not  visualized. Mild aortic valve sclerosis is present, with no evidence of  aortic valve stenosis.   5. Aortic dilatation noted. There is mild dilatation of the ascending  aorta, measuring 40 mm.   6. The inferior vena cava is normal in size with greater than 50%  respiratory variability, suggesting right atrial pressure of 3 mmHg.  MRI Head 11/11/21: IMPRESSION: 1. No acute intracranial infarct or other abnormality. 2. Chronic left parietal infarct. 3. Underlying mild to moderate chronic microvascular ischemic disease.   Patient Profile     76 y.o.  male with CAD s/p remote PCIs of RCA 2004 and 2007 with otherwise  mild nonbstructive CAD, stage 4 lung CA, CVA, paroxysmal atrial fibrillation on Tikosyn, ILR in place, QT prolongation, HTN, HLD, prostate CA, OSA on CPAP, mild carotid artery disease (9-45% RICA, 03-88% LICA by duplex 82/8003), prior anemia requiring transfusions (felt chemo related), mild dilation of ascending aorta who presented with weakness, cough and DOE found to have possible CAP and afib with RVR for which cardiology has been consulted.   Assessment & Plan    #Atrial fibrillation with RVR Recent ILR with 0% Afib and thus suspect RVR now driven by acute ilness. Tikosyn stopped this admission due to prolonged QT and hyponatremia. Was placed on amiodarone bolus +gtt and then temporarily on PO amiodarone but developed recurrent RVR and therefore amiodarone gtt was restarted. Can consider DCCV in the future if remains in Afib, however, given acute illness, he is unlikely to maintain rhythm at this time. -Will start dilt 180mg  daily and wean dilt gtt off -Continue 400mg  BIDx7 days; will not continue long-term given underlying lung malignancy -Continue metop 100mg  BID -S/p digoxin load; can add oral dig if needed -Continue xarelto for [facility]  #Possible CAP vs pneumonitis #Lung cancer stage IV On cefepime for planned 8 day course. Agree with palliative care assessment. -Management per critical care  #Concern for TIA: Patient with episode of expressive aphasia on 2/6 concern for acute stroke vs TIA. CT head without acute abnormality. CTA head and neck with 49-17% LICA but no large vessel occlusion. MRI head with no acute intracranial infarct but with chronic left parietal infarct and mild-to-moderate chronic microvascular disease. Symptoms have since resolved. -Xarelto resumed -Recommended for vascular evaluation for 91-50% LICA  #Possible PE noted on CTA: Noted to have possible filling defect on CTA head and neck that was obtained due to expressive aphasia as above. -Resumed on  xarelto  #Hyponatremia and pancytopenia: Likely related to underlying malignancy.  -Management per primary  #Suspected Diastolic HF: #RV Dysfunction: #LE edema: Low albumin likely contributing to third spacing. Also may be related to mild CHF in the setting of Afib with RVR. Now improving and has not required further diuresis. -Continue compression socks -Currently euvolemic; no diuresis needed at this time    For questions or updates, please contact [place] Please consult [email or web address] for contact info under        Signed, Freada Bergeron, MD  11/14/2021, 6:43 AM

## 2021-11-13 NOTE — Progress Notes (Signed)
Progress Note   Patient: Corey [name] GMW:[id number] DOB: 1946-01-12 DOA: 11/03/2021     10 DOS: the patient was seen and examined on 11/13/2021   Brief hospital course: Corey [name] is a 76 y.o. male with PMH stage IV NSCLC with liver mets, PAF, CAD, chronic low back p ain, anemia, anxiety, osteoarthritis, HLD, HTN, GERD, OSA on CPAP, prostate cancer who presented with approximately 4 days of progressively worsening weakness and ongoing productive cough of yellow sputum with associated pleuritic chest pain, malaise, dyspnea.  He recently deceived chemotherapy within the past week as well.  Due to continued symptoms of feeling poorly, he presented for further evaluation. On evaluation in the ER, he underwent CXR which showed ongoing extensive irregular opacities involving the left lung not significantly changed from prior imaging.  Prior CT chest on 10/15/2021 showed a stable dense consolidation in the lingula and left lower lobe. Notable labs included WBC 2.8, platelet count 158.  Sodium 128.  BNP 187. There was concern for possible pneumonia and he was started on vancomycin and cefepime. He also developed A. fib with RVR and was started on a Cardizem drip.  Assessment and Plan: * Atrial fibrillation with RVR (Corey [name])- (present on admission) - on Tikosyn, Toprol, and Xarelto at home - Patient noted to be in A. fib with RVR on admission - cardiology consulted for further assistance given complexity of med regimen and prolonged QTc - Given digoxin load on 2/7.  - On dilt gtt for now, plan to wean to PO likely tomorrow -Plan to transition to PO amiodarone today -can consider DCCV in the future if pt remains in afib -metoprolol increased to 100mg  bid per Cardiology -Xarelto continued  Dysarthria - acutely happened overnight of 2/6 with concern for a stroke. 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 -Follow-up MRI brain noted to be unremarkable, now on Xarelto today  Stasis dermatitis - more prominent on LLE; no overt cellulitis signs as nontender as well - B/L LE duplex negative for DVT as well, performed 11/06/21 - likely caused by ongoing LE edema - some improvement in LE edema with lasix as well; will try and apply TED hose soon too   Prolonged Q-T interval on ECG - tikosyn now officially discontinued per cardiology - continue monitoring electrolytes; goal K>4, Mg>2  Macrocytic anemia- (present on admission) - no overt bleeding - downtrend suspected in setting of chemo recently   Leukopenia- (present on admission) - likely multifactorial from underlying infection plus recent chemo  -resolved  CAP (community acquired pneumonia)- (present on admission) - ongoing LLL consolidation which may be acutely worse on admission; symptomatically he felt worse over the past ~1 week prior to admission - s/p vanc and cefepime  - to complete flagyl on 2/10 - flagyl added 2/6 to treat through 2/10 for total 5 day course - follow up cultures (blood cultures remain negative)  Non-small cell carcinoma of left lung, stage 4 (Corey [name])- (present on admission) - follows outpatient with Dr. [name] -Palliative Care consulted -Recently reported having the "will" to proceed with therapy moving forward - Palliative Care following  Hyponatremia- (present on admission) - appeared clinically volume depleted on admission -  unfortunately edema was a big issue in LE; given hypoalbuminemia he is likely 3rd spacing. FeNa calculated <<1% on 11/06/21 consistent with intravascular volume depletion and worsening hyponatremia at that time - pt is s/p albumin - Na improving with albumin and  lasix; cardiology also giving this intermittently  - repeat BMP in AM  OSA on CPAP - continue nightly CPAP  GERD (gastroesophageal reflux disease)- (present on admission) - continue PPI  Dyslipidemia- (present on admission) - Continue Lipitor  Benign essential HTN- (present on admission) - BP stable and controlled at this time - continue monitoring   Coronary artery disease- (present on admission) - Continue Lipitor, BB -On Xarelto for underlying A. fib  Sepsis (HCC)-resolved as of 11/08/2021, (present on admission) - neutropenia, tachycardia, tachypnea, lung source suspected - see pna treatment      Subjective: In good spirits. Reports feeling well this afternoon. Looking forward to rehab after discharge  Physical Exam: Vitals:   11/13/21 0753 11/13/21 0754 11/13/21 0755 11/13/21 1200  BP:      Pulse:      Resp:      Temp:   (!) 97.3 F (36.3 C) 97.6 F (36.4 C)  TempSrc:   Axillary Axillary  SpO2: 95% 95% 98%    General exam: Awake, laying in bed, in nad Respiratory system: Normal respiratory effort, no wheezing Cardiovascular system: regular rate, s1, s2 Gastrointestinal system: Soft, nondistended, positive BS Central nervous system: CN2-12 grossly intact, strength intact Extremities: Perfused, no clubbing Skin: Normal skin turgor, no notable skin lesions seen Psychiatry: Mood normal // no visual hallucinations   Data Reviewed:  Labs reviewed  Family Communication: Pt in room, family not at bedside  Disposition: Status is: Inpatient Remains inpatient appropriate because: Severity of illness    Planned Discharge Destination: Barriers to discharge: amiodarone gtt, dilt gtt, cardiology cont to follow      Author: Marylu Lund, MD 11/13/2021 12:34 PM  For on call review [email or web address].

## 2021-11-13 NOTE — TOC Progression Note (Signed)
Transition of Care [facility]) - Progression Note    Patient Details  Name: Corey [name] MRN: [id number] Date of Birth: 31-Oct-1945  Transition of [facility]) CM/SW Contact  Leeroy Cha, RN Phone Number: 11/13/2021, 7:48 AM  Clinical Narrative:     C.M. following for toc needs.    Barriers to Discharge: Continued Medical Work up  Expected Discharge Plan and Services                                                 Social Determinants of Health (SDOH) Interventions    Readmission Risk Interventions Readmission Risk Prevention Plan 08/05/2021  Transportation Screening Complete  PCP or Specialist appointment within 3-5 days of discharge Complete  HRI or [place] Complete  SW Recovery Care/Counseling Consult Complete  [place] Not Applicable  Some recent data might be hidden

## 2021-11-13 NOTE — Progress Notes (Signed)
Progress Note  Patient Name: Corey [name] Date of Encounter: 11/13/2021  [facility] Cardiologist: Mertie Moores, MD   Subjective  Feeling better. Breathing slowly improving.  HR remain better controlled 60-100s Remains on amiodarone gtt and dilt gtt  Inpatient Medications    Scheduled Meds:  acidophilus  1 capsule Oral Daily   arformoterol  15 mcg Nebulization BID   atorvastatin  40 mg Oral QHS   budesonide  0.5 mg Nebulization BID   carbidopa-levodopa  1 tablet Oral 4 times per day   Carbinoxamine Maleate  6 mg Oral BID   Chlorhexidine Gluconate Cloth  6 each Topical Daily   feeding supplement  237 mL Oral TID BM   fenofibrate  160 mg Oral Q1400   fluticasone  2 spray Each Nare Daily   folic acid  1 mg Oral Daily   guaiFENesin  1,200 mg Oral BID   ipratropium  2 spray Each Nare q AM   lactulose  10 g Oral BID   levalbuterol  0.63 mg Nebulization BID   mouth rinse  15 mL Mouth Rinse BID   metoprolol tartrate  50 mg Oral BID   multivitamin with minerals  1 tablet Oral Daily   pantoprazole  40 mg Oral Daily   polyethylene glycol  17 g Oral BID   predniSONE  40 mg Oral Q breakfast   Followed by   Derrill Memo ON 11/17/2021] predniSONE  30 mg Oral Q breakfast   Followed by   Derrill Memo ON 11/24/2021] predniSONE  20 mg Oral Q breakfast   Followed by   Derrill Memo ON 12/01/2021] predniSONE  10 mg Oral Q breakfast   Followed by   Derrill Memo ON 12/08/2021] predniSONE  5 mg Oral Q breakfast   revefenacin  175 mcg Nebulization Daily   rivaroxaban  20 mg Oral QPM   senna-docusate  1 tablet Oral BID   sodium chloride flush  3 mL Intravenous Q12H   sorbitol  30 mL Oral Daily   tamsulosin  0.4 mg Oral QPC supper   topiramate  100 mg Oral BID   Continuous Infusions:  sodium chloride Stopped (11/06/21 1119)   sodium chloride Stopped (11/10/21 0152)   sodium chloride Stopped (11/13/21 0329)   amiodarone 30 mg/hr (11/13/21 0700)   diltiazem (CARDIZEM) infusion 7.5 mg/hr (11/13/21 0922)    metronidazole Stopped (11/13/21 1015)   PRN Meds: sodium chloride, sodium chloride, acetaminophen **OR** acetaminophen, ALPRAZolam, alum & mag hydroxide-simeth, benzonatate, dextromethorphan, levalbuterol, lip balm, metoCLOPramide (REGLAN) injection, nitroGLYCERIN, sodium chloride flush   Vital Signs    Vitals:   11/13/21 0700 11/13/21 0753 11/13/21 0754 11/13/21 0755  BP: 133/88     Pulse: 87     Resp: 12     Temp:    (!) 97.3 F (36.3 C)  TempSrc:    Axillary  SpO2: 99% 95% 95% 98%    Intake/Output Summary (Last 24 hours) at 11/13/2021 1213 Last data filed at 11/13/2021 0900 Gross per 24 hour  Intake 1225.06 ml  Output 3100 ml  Net -1874.94 ml    Last 3 Weights 10/28/2021 10/23/2021 10/20/2021  Weight (lbs) 228 lb 8 oz 229 lb 12.8 oz 229 lb 1.6 oz  Weight (kg) 103.647 kg 104.237 kg 103.919 kg      Telemetry    Afib with HR better controlled 60-100s- Personally Reviewed  ECG    No new tracing- Personally Reviewed  Physical Exam   GEN: No acute distress.   Neck: No JVD Cardiac:  Irregularly, irregular Respiratory: Scattered rhonchi, better air movement than prior GI: Soft, nontender, non-distended  MS: Trace edema, warm Neuro:  Nonfocal  Psych: Normal affect   Labs    High Sensitivity Troponin:  No results for input(s): TROPONINIHS in the last 720 hours.   Chemistry Recent Labs  Lab 11/10/21 0050 11/10/21 0316 11/11/21 0242 11/12/21 0400 11/13/21 0310  NA 129*   < > 131* 133* 132*  K 3.4*   < > 4.0 4.1 4.3  CL 93*   < > 96* 97* 97*  CO2 27   < > 29 30 28   GLUCOSE 113*   < > 137* 131* 114*  BUN 10   < > 12 11 16   CREATININE 0.78   < > 0.72 0.74 0.82  CALCIUM 8.5*   < > 8.7* 8.7* 8.8*  MG  --    < > 2.0 1.9 1.9  PROT 6.0*  --   --   --   --   ALBUMIN 2.7*  --   --   --   --   AST 24  --   --   --   --   ALT 5  --   --   --   --   ALKPHOS 49  --   --   --   --   BILITOT 0.7  --   --   --   --   GFRNONAA >60   < > >60 >60 >60  ANIONGAP 9   < > 6 6 7     < > = values in this interval not displayed.     Lipids No results for input(s): CHOL, TRIG, HDL, LABVLDL, LDLCALC, CHOLHDL in the last 168 hours.  Hematology Recent Labs  Lab 11/11/21 0242 11/12/21 0400 11/13/21 0310  WBC 7.1 7.6 8.5  RBC 2.45* 2.51* 2.57*  HGB 8.4* 8.4* 8.7*  HCT 25.3* 26.1* 26.9*  MCV 103.3* 104.0* 104.7*  MCH 34.3* 33.5 33.9  MCHC 33.2 32.2 32.3  RDW 14.6 14.6 14.9  PLT 93* 127* 150    Thyroid No results for input(s): TSH, FREET4 in the last 168 hours.  BNP No results for input(s): BNP, PROBNP in the last 168 hours.   DDimer No results for input(s): DDIMER in the last 168 hours.   Radiology    No results found.  Cardiac Studies   2D echo 08/2021  1. Left ventricular ejection fraction, by estimation, is 65 to 70%. The  left ventricle has normal function. The left ventricle has no regional  wall motion abnormalities. There is mild left ventricular hypertrophy.  Left ventricular diastolic parameters  are indeterminate.   2. Right ventricular systolic function mild to moderately reduced. The  right ventricular size is mildly enlarged. There is normal pulmonary  artery systolic pressure.   3. Trivial mitral valve regurgitation.   4. The aortic valve is tricuspid. Aortic valve regurgitation is not  visualized. Mild aortic valve sclerosis is present, with no evidence of  aortic valve stenosis.   5. Aortic dilatation noted. There is mild dilatation of the ascending  aorta, measuring 40 mm.   6. The inferior vena cava is normal in size with greater than 50%  respiratory variability, suggesting right atrial pressure of 3 mmHg.  MRI Head 11/11/21: IMPRESSION: 1. No acute intracranial infarct or other abnormality. 2. Chronic left parietal infarct. 3. Underlying mild to moderate chronic microvascular ischemic disease.   Patient Profile     76 y.o. male with  CAD s/p remote PCIs of RCA 2004 and 2007 with otherwise mild nonbstructive CAD, stage 4 lung  CA, CVA, paroxysmal atrial fibrillation on Tikosyn, ILR in place, QT prolongation, HTN, HLD, prostate CA, OSA on CPAP, mild carotid artery disease (5-37% RICA, 48-27% LICA by duplex 04/8674), prior anemia requiring transfusions (felt chemo related), mild dilation of ascending aorta who presented with weakness, cough and DOE found to have possible CAP and afib with RVR for which cardiology has been consulted.   Assessment & Plan    #Atrial fibrillation with RVR Recent ILR with 0% Afib and thus suspect RVR now driven by acute ilness. Tikosyn stopped this admission due to prolonged QT and hyponatremia. Was placed on amiodarone bolus +gtt and then temporarily on PO amiodarone but developed recurrent RVR and therefore amiodarone gtt was restarted. Can consider DCCV in the future if remains in Afib, however, given acute illness, he is unlikely to maintain rhythm at this time. -Continue dilt gtt for now; hopefully can wean off by tomorrow -Change amiodarone from IV to 400mg  BID; will not continue long-term given underlying lung malignancy -Increase metop to 100mg  BID -S/p digoxin load; can add oral dig if needed -Continue xarelto for [facility]  #Possible CAP vs pneumonitis #Lung cancer stage IV On cefepime for planned 8 day course. Agree with palliative care assessment. -Management per critical care  #Concern for TIA: Patient with episode of expressive aphasia on 2/6 concern for acute stroke vs TIA. CT head without acute abnormality. CTA head and neck with 44-92% LICA but no large vessel occlusion. MRI head with no acute intracranial infarct but with chronic left parietal infarct and mild-to-moderate chronic microvascular disease. Symptoms have since resolved. -Xarelto resumed -Recommended for vascular evaluation for 01-00% LICA  #Possible PE noted on CTA: Noted to have possible filling defect on CTA head and neck that was obtained due to expressive aphasia as above. -Resumed on xarelto  #Hyponatremia  and pancytopenia: Likely related to underlying malignancy.  -Management per primary  #Suspected Diastolic HF: #RV Dysfunction: #LE edema: Low albumin likely contributing to third spacing. Also may be related to mild CHF in the setting of Afib with RVR. Now improving and has not required further diuresis. -Continue compression socks -Currently euvolemic; no diuresis needed at this time    For questions or updates, please contact [place] Please consult [email or web address] for contact info under        Signed, Freada Bergeron, MD  11/13/2021, 12:13 PM

## 2021-11-14 ENCOUNTER — Encounter (HOSPITAL_COMMUNITY): Payer: Self-pay | Admitting: *Deleted

## 2021-11-14 DIAGNOSIS — I4891 Unspecified atrial fibrillation: Secondary | ICD-10-CM | POA: Diagnosis not present

## 2021-11-14 DIAGNOSIS — I2510 Atherosclerotic heart disease of native coronary artery without angina pectoris: Secondary | ICD-10-CM | POA: Diagnosis not present

## 2021-11-14 LAB — CBC WITH DIFFERENTIAL/PLATELET
Abs Immature Granulocytes: 0.52 10*3/uL — ABNORMAL HIGH (ref 0.00–0.07)
Basophils Absolute: 0 10*3/uL (ref 0.0–0.1)
Basophils Relative: 0 %
Eosinophils Absolute: 0 10*3/uL (ref 0.0–0.5)
Eosinophils Relative: 0 %
HCT: 28.1 % — ABNORMAL LOW (ref 39.0–52.0)
Hemoglobin: 9 g/dL — ABNORMAL LOW (ref 13.0–17.0)
Immature Granulocytes: 6 %
Lymphocytes Relative: 8 %
Lymphs Abs: 0.6 10*3/uL — ABNORMAL LOW (ref 0.7–4.0)
MCH: 33.8 pg (ref 26.0–34.0)
MCHC: 32 g/dL (ref 30.0–36.0)
MCV: 105.6 fL — ABNORMAL HIGH (ref 80.0–100.0)
Monocytes Absolute: 1.1 10*3/uL — ABNORMAL HIGH (ref 0.1–1.0)
Monocytes Relative: 14 %
Neutro Abs: 5.8 10*3/uL (ref 1.7–7.7)
Neutrophils Relative %: 72 %
Platelets: 185 10*3/uL (ref 150–400)
RBC: 2.66 MIL/uL — ABNORMAL LOW (ref 4.22–5.81)
RDW: 15.1 % (ref 11.5–15.5)
WBC: 8.1 10*3/uL (ref 4.0–10.5)
nRBC: 0.2 % (ref 0.0–0.2)

## 2021-11-14 LAB — BASIC METABOLIC PANEL
Anion gap: 5 (ref 5–15)
BUN: 19 mg/dL (ref 8–23)
CO2: 28 mmol/L (ref 22–32)
Calcium: 8.8 mg/dL — ABNORMAL LOW (ref 8.9–10.3)
Chloride: 100 mmol/L (ref 98–111)
Creatinine, Ser: 0.79 mg/dL (ref 0.61–1.24)
GFR, Estimated: >60 mL/min (ref >60)
Glucose, Bld: 100 mg/dL — ABNORMAL HIGH (ref 70–99)
Potassium: 4 mmol/L (ref 3.5–5.1)
Sodium: 133 mmol/L — ABNORMAL LOW (ref 135–145)

## 2021-11-14 LAB — MAGNESIUM: Magnesium: 1.9 mg/dL (ref 1.7–2.4)

## 2021-11-14 MED ORDER — DILTIAZEM HCL ER COATED BEADS 180 MG PO CP24
180.0000 mg | ORAL_CAPSULE | Freq: Every day | ORAL | Status: DC
  Administered 2021-11-14: 180 mg via ORAL
  Filled 2021-11-14: qty 1

## 2021-11-14 NOTE — Evaluation (Signed)
Occupational Therapy Evaluation Patient Details Name: Corey [name] MRN: [id number] DOB: [birth year] Today's Date: 11/14/2021   History of Present Illness 76yo male who presented on 10/25 with L LE edema and generalized weakness. Also with recent hx of PNA. Admitted with L LE cellulitis. PMH NSCLC on chemo, Afib, CAD, chronic LBP with radiculopathy, HLD, HTN, prostate CA, CVA, ACDF, back surgery, L rotator cuff repair, TKA   Clinical Impression   Corey [name] is a 76 year old man who presents with generalized weakness, decreased activity tolerance and impaired balance and cardiopulmonary endurance  resulting in a decline in functional abilities. Prior to admission patient had returned to independence with ADLs, able to traverse 14 steps upstairs and ambulating with PT in the driveway. Currently patient exhibited effort transferring to the side of the bed, is min guard for standing at edge of bed and marching with RW, and needing increased assistance with ADLs. His upper extremities exhibit functional strength but both are tremulous during assessment which he reports is not normal. Patient had been up during the day with nursing and just returned to bed therefore patient not requested to sit up and was min assist to return to supine. Patient will benefit from skilled OT services while in hospital to improve deficits and learn compensatory strategies as needed in order to return to PLOF.  Patient is highly motivated and requests return to inpatient rehab in order to maximize his physical abilities prior to returning home and therapist agrees patient would be an excellent candidate.     Recommendations for follow up therapy are one component of a multi-disciplinary discharge planning process, led by the attending physician.  Recommendations may be updated based on patient status, additional functional criteria and insurance authorization.   Follow Up Recommendations  Acute inpatient rehab (3hours/day)     Assistance Recommended at Discharge Frequent or constant Supervision/Assistance  Patient can return home with the following A little help with walking and/or transfers;A lot of help with bathing/dressing/bathroom;Help with stairs or ramp for entrance;Assistance with cooking/housework    Functional Status Assessment  Patient has had a recent decline in their functional status and demonstrates the ability to make significant improvements in function in a reasonable and predictable amount of time.  Equipment Recommendations  None recommended by OT    Recommendations for Other Services Rehab consult     Precautions / Restrictions Precautions Precautions: Fall Restrictions Weight Bearing Restrictions: No      Mobility Bed Mobility Overal bed mobility: Needs Assistance Bed Mobility: Supine to Sit, Sit to Supine     Supine to sit: Supervision, HOB elevated Sit to supine: Min assist   General bed mobility comments: Increased time and effort to transfer to the side of the bed. Min assist for LEs to return to supine.    Transfers Overall transfer level: Needs assistance   Transfers: Sit to/from Stand Sit to Stand: Min guard, From elevated surface           General transfer comment: Min guard to stand from elevated bed with RW. Patient able to march in place 27 seconds and take steps to the head of the bed. Increased WOB and satdown to 88% on 4 L HFNC. Reports feeling "a little winded" but no other significant cmplaints.      Balance Overall balance assessment: Needs assistance Sitting-balance support: No upper extremity supported, Feet supported Sitting balance-Leahy Scale: Good     Standing balance support: Reliant on assistive device for balance Standing balance-Leahy Scale:  Poor Standing balance comment: reliant on walker                           ADL either performed or assessed with clinical judgement   ADL Overall ADL's : Needs  assistance/impaired Eating/Feeding: Independent   Grooming: Set up;Sitting   Upper Body Bathing: Set up;Sitting   Lower Body Bathing: Minimal assistance;Sit to/from stand   Upper Body Dressing : Set up;Sitting   Lower Body Dressing: Moderate assistance;Sit to/from stand   Toilet Transfer: Min guard;Rolling walker (2 wheels);BSC/3in1   Toileting- Water quality scientist and Hygiene: Moderate assistance;Sit to/from stand Toileting - Clothing Manipulation Details (indicate cue type and reason): assistance for clothing management     Functional mobility during ADLs: Min guard;Rolling walker (2 wheels)       Vision Baseline Vision/History: 1 Wears glasses Patient Visual Report: No change from baseline       Perception     Praxis      Pertinent Vitals/Pain Pain Assessment Pain Assessment: No/denies pain     Hand Dominance Right   Extremity/Trunk Assessment Upper Extremity Assessment Upper Extremity Assessment: RUE deficits/detail;LUE deficits/detail RUE Deficits / Details: WFL ROM, 4/5 shoulder strength otherwise 5/5 throughout, UE tremulous RUE Sensation: WNL RUE Coordination: WNL LUE Deficits / Details: WFL ROM, 4/5 shoulder strength otherwise 5/5 throughout, UE tremulous LUE Sensation: WNL LUE Coordination: WNL   Lower Extremity Assessment Lower Extremity Assessment: Defer to PT evaluation   Cervical / Trunk Assessment Cervical / Trunk Assessment: Normal   Communication Communication Communication: No difficulties   Cognition Arousal/Alertness: Awake/alert Behavior During Therapy: WFL for tasks assessed/performed Overall Cognitive Status: Within Functional Limits for tasks assessed                                       General Comments       Exercises     Shoulder Instructions      Home Living Family/patient expects to be discharged to:: Inpatient rehab Living Arrangements: Spouse/significant other                                Additional Comments: Still has bedroom downstairs available.      Prior Functioning/Environment Prior Level of Function : Needs assist             Mobility Comments: using a walker in home, getting HH PT, had gotten well enough to get up 14 steps to bedroom, walk up and down the driveway ADLs Comments: Independent with ADLs, bathing at the sink        OT Problem List: Decreased strength;Decreased activity tolerance;Impaired balance (sitting and/or standing)      OT Treatment/Interventions: Self-care/ADL training;Therapeutic exercise;DME and/or AE instruction;Therapeutic activities;Balance training;Patient/family education    OT Goals(Current goals can be found in the care plan section) Acute Rehab OT Goals Patient Stated Goal: TO maximize strength in order to go home and be independent and comfortable at home OT Goal Formulation: With patient Time For Goal Achievement: 11/28/21 Potential to Achieve Goals: Good  OT Frequency: Min 2X/week    Co-evaluation              AM-PAC OT "6 Clicks" Daily Activity     Outcome Measure Help from another person eating meals?: None Help from another person taking care of personal grooming?: A  Little Help from another person toileting, which includes using toliet, bedpan, or urinal?: A Lot Help from another person bathing (including washing, rinsing, drying)?: A Lot Help from another person to put on and taking off regular upper body clothing?: A Little Help from another person to put on and taking off regular lower body clothing?: A Lot 6 Click Score: 16   End of Session Equipment Utilized During Treatment: Rolling walker (2 wheels) Nurse Communication: Mobility status  Activity Tolerance: Patient tolerated treatment well Patient left: in chair;with call bell/phone within reach;with bed alarm set  OT Visit Diagnosis: Unsteadiness on feet (R26.81);Muscle weakness (generalized) (M62.81)                Time: 1753-0104 OT  Time Calculation (min): 27 min Charges:  OT General Charges $OT Visit: 1 Visit OT Evaluation $OT Eval Moderate Complexity: 1 Mod  Altheia Shafran, OTR/L [place]  Office [phone] Pager: [place] 11/14/2021, 3:28 PM

## 2021-11-14 NOTE — Progress Notes (Signed)
Daily Progress Note   Patient Name: Corey [name]       Date: 11/14/2021 DOB: Feb 15, 1946  Age: 76 y.o. MRN#: [id number] Attending Physician: Donne Hazel, MD Primary Care Physician: [name], MD Admit Date: 11/03/2021  Reason for Consultation/Follow-up: Establishing goals of care  Subjective: I met today with Corey [name], his wife, his brother, and his daughter.  Another brother and 2 sons joined [place] on the phone as well.  We reviewed his clinical course and the fact that he has been improving in regard to his pneumonia, A-fib, and hyponatremia.  He has been feeling stronger and is hopeful to transition for further rehab at the end of this hospitalization.  He reports that he previously was in CIR at [facility] and did very well with the intense regimen there.  Family is hopeful that this will be an option when he is ready to discharge from the hospital.  We also continued discussion about longer-term goals of care and final decision will be made about next steps once his functional status is maximized.  We discussed potential options of follow-up with Dr. [name] for further chemotherapy versus maximizing his functional status and then considering election of his hospice benefits depending upon how his rehab course goes.  Length of Stay: 11  Current Medications: Scheduled Meds:   acidophilus  1 capsule Oral Daily   amiodarone  400 mg Oral BID   arformoterol  15 mcg Nebulization BID   atorvastatin  40 mg Oral QHS   budesonide  0.5 mg Nebulization BID   carbidopa-levodopa  1 tablet Oral 4 times per day   Carbinoxamine Maleate  6 mg Oral BID   Chlorhexidine Gluconate Cloth  6 each Topical Daily   diltiazem  180 mg Oral Daily   feeding supplement  237 mL Oral TID BM    fenofibrate  160 mg Oral Q1400   fluticasone  2 spray Each Nare Daily   folic acid  1 mg Oral Daily   guaiFENesin  1,200 mg Oral BID   ipratropium  2 spray Each Nare q AM   lactulose  10 g Oral BID   levalbuterol  0.63 mg Nebulization BID   mouth rinse  15 mL Mouth Rinse BID   metoprolol tartrate  100 mg Oral BID   multivitamin with minerals  1 tablet Oral Daily   pantoprazole  40 mg Oral Daily   polyethylene glycol  17 g Oral BID   predniSONE  40 mg Oral Q breakfast   Followed by   Derrill Memo ON 11/17/2021] predniSONE  30 mg Oral Q breakfast   Followed by   Derrill Memo ON 11/24/2021] predniSONE  20 mg Oral Q breakfast   Followed by   Derrill Memo ON 12/01/2021] predniSONE  10 mg Oral Q breakfast   Followed by   Derrill Memo ON 12/08/2021] predniSONE  5 mg Oral Q breakfast   revefenacin  175 mcg Nebulization Daily   rivaroxaban  20 mg Oral QPM   senna-docusate  1 tablet Oral BID   sodium chloride flush  3 mL Intravenous Q12H   sorbitol  30 mL Oral Daily   tamsulosin  0.4 mg Oral QPC supper   topiramate  100 mg Oral BID    Continuous Infusions:  sodium chloride Stopped (11/06/21 1119)   sodium chloride Stopped (11/10/21 0152)   sodium chloride Stopped (11/13/21 1400)   diltiazem (CARDIZEM) infusion 5 mg/hr (11/14/21 1200)    PRN Meds: sodium chloride, sodium chloride, acetaminophen **OR** acetaminophen, ALPRAZolam, alum & mag hydroxide-simeth, benzonatate, dextromethorphan, levalbuterol, lip balm, metoCLOPramide (REGLAN) injection, nitroGLYCERIN, sodium chloride flush  Physical Exam     General: Alert, awake, in no acute distress.   HEENT: No bruits, no goiter, no JVD Heart: Regular rate and rhythm. No murmur appreciated. Lungs: Good air movement, clear Abdomen: Soft, nontender, nondistended, positive bowel sounds.   Ext: No significant edema Skin: Warm and dry Neuro: Grossly intact, nonfocal.       Vital Signs: BP 135/87    Pulse 68    Temp 97.7 F (36.5 C) (Oral)     Resp 17    SpO2 100%  SpO2: SpO2: 100 % O2 Device: O2 Device: Nasal Cannula O2 Flow Rate: O2 Flow Rate (L/min): 4 L/min  Intake/output summary:  Intake/Output Summary (Last 24 hours) at 11/14/2021 1534 Last data filed at 11/14/2021 1200 Gross per 24 hour  Intake 528.64 ml  Output 2850 ml  Net -2321.36 ml   LBM: Last BM Date: 11/13/21 Baseline Weight:   Most recent weight:         Palliative Assessment/Data:    Flowsheet Rows    Flowsheet Row Most Recent Value  Intake Tab   Referral Department Hospitalist  Unit at Time of Referral ICU  Palliative Care Primary Diagnosis Cancer  Date Notified 11/09/21  Palliative Care Type New Palliative care  Reason for referral Clarify Goals of Care  Date of Admission 11/03/21  Date first seen by Palliative Care 11/10/21  # of days Palliative referral response time 1 Day(s)  # of days IP prior to Palliative referral 6  Clinical Assessment   Palliative Performance Scale Score 50%  Psychosocial & Spiritual Assessment   Palliative Care Outcomes   Patient/Family meeting held? Yes  Who was at the meeting? Patient, wife, son, daughter, brother and other son via phone       Patient Active Problem List   Diagnosis Date Noted   Dysarthria 11/10/2021   Stasis dermatitis 11/06/2021   Prolonged Q-T interval on ECG 11/04/2021   Atrial fibrillation with RVR ([place]) 11/03/2021   Leukopenia 11/03/2021   Macrocytic anemia 11/03/2021   Streptococcal sore throat    Acute on chronic anemia    Supplemental oxygen dependent    Notalgia    Adjustment disorder with mixed anxiety and depressed mood    Debility  08/12/2021   Acute respiratory failure with hypoxia ([place]) 08/01/2021   Cellulitis of left leg 07/30/2021   Hypokalemia 07/30/2021   Hypomagnesemia 07/30/2021   Nausea & vomiting 07/30/2021   Cellulitis of left lower extremity    Malaise 07/28/2021   Symptomatic anemia 05/14/2021   Port-A-Cath in place 04/27/2021    Genetic testing 94/32/7614   Monoallelic mutation of ATM Shyia Fillingim 04/09/2021   COPD with acute exacerbation ([place]) 04/02/2021   Cough with hemoptysis 04/01/2021   CAP (community acquired pneumonia) 04/01/2021   Family history of prostate cancer 03/20/2021   Family history of breast cancer 03/20/2021   Family history of lung cancer 03/20/2021   Goals of care, counseling/discussion 03/18/2021   Encounter for antineoplastic chemotherapy 03/18/2021   Encounter for antineoplastic immunotherapy 03/18/2021   Primary cancer of left lower lobe of lung ([place]) 03/07/2021   Non-small cell carcinoma of left lung, stage 4 ([place]) 03/05/2021   Malnutrition of moderate degree 02/25/2021   Pleural effusion on left 02/24/2021   Hilar mass    Reactive airway disease 11/20/2020   Allergic rhinitis with non-allergic component 09/18/2020   Heartburn 09/18/2020   Coughing 09/18/2020   Malignant neoplasm of prostate ([place]) 03/04/2020   Pre-syncope 02/27/2020   Cerebral embolism with transient ischemic attack (TIA) 02/11/2020   History of stroke 70/92/9574   Embolic stroke involving cerebral artery ([place]) 02/11/2020   Secondary hypercoagulable state ([place]) 09/10/2019   Essential tremor 06/25/2019   Chest pain 05/27/2017   Demand ischemia ([place]) 05/27/2017   Morbid obesity ([place]) 05/27/2017   Carotid artery disease ([place]) 08/30/2016   Essential hypertension 08/30/2016   Diverticulitis of intestine without perforation or abscess without bleeding    Hyponatremia 06/04/2016   Diverticulitis 05/31/2016   Iron deficiency 01/05/2016   Lumbar radiculopathy, chronic 02/04/2015   SVT (supraventricular tachycardia) ([place]) 12/03/2014   PAC (premature atrial contraction) 04/05/2014   OSA on CPAP 03/27/2014   AKI (acute kidney injury) ([place]) 12/05/2013   Nausea 12/05/2013   Abdominal pain 12/05/2013   AF (paroxysmal atrial fibrillation) ([place]) 01/05/2013   CVA (cerebral infarction)  07/06/2012   Coronary artery disease    Benign essential HTN    Dyslipidemia    Shortness of breath    GERD (gastroesophageal reflux disease)     Palliative Care Assessment & Plan   Patient Profile: 76 y.o. male  with past medical history of stage IV NSCLC with liver mets, PAF, CAD, anemia, anxiety, OA, HLD, HTN, GERD, OSA, Prostate cancer admitted on 11/03/2021 with CAP, pneumonitis, a fib with RVR, hyponatremia, volume overload, sepsis, and dysarthria.  Recommendations/Plan: DNR/DNI Hopeful for rehab at time of discharge from the hospital.  He has done well in the past at [facility] and is hopeful that this will be an option following this hospitalization.  Await PT/OT recommendations. I would also recommend palliative care continue to follow in order to help with decision-making once it is determined how his functional status looks following rehab.  He is still at a point of considering further disease modifying therapy versus focusing on maintaining what ever functional status he regains for as long as possible with best supportive care.  Code Status:    Code Status Orders  (From admission, onward)           Start     Ordered   11/11/21 1256  Do not attempt resuscitation (DNR)  Continuous       Question Answer Comment  In the event of cardiac or respiratory ARREST Do  not call a code blue   In the event of cardiac or respiratory ARREST Do not perform Intubation, CPR, defibrillation or ACLS   In the event of cardiac or respiratory ARREST Use medication by any route, position, wound care, and other measures to relive pain and suffering. May use oxygen, suction and manual treatment of airway obstruction as needed for comfort.      11/11/21 1255           Code Status History     Date Active Date Inactive Code Status Order ID Comments User Context   11/03/2021 1451 11/11/2021 1255 Full Code [id number]  Reubin Milan, MD ED   11/03/2021 1447 11/03/2021 1450 Full Code  [id number]  Reubin Milan, MD ED   08/12/2021 1940 08/29/2021 1700 DNR [id number]  Romana Juniper, LPN Inpatient   54/06/8263 1638 08/12/2021 1940 Full Code [id number]  Cathlyn Parsons, PA-C Inpatient   08/12/2021 1638 08/12/2021 1638 DNR [id number]  Cathlyn Parsons, PA-C Inpatient   07/29/2021 0820 08/12/2021 1635 DNR [id number]  Jonnie Finner, DO Inpatient   07/29/2021 0511 07/29/2021 0820 Full Code [id number]  Howerter, Ethelda Chick, DO Inpatient   05/14/2021 0945 05/20/2021 2201 Full Code [id number]  Mckinley Jewel, MD ED   04/01/2021 0948 04/04/2021 1632 DNR [id number]  Collier Bullock, MD ED   02/24/2021 1611 02/27/2021 1834 Full Code [id number]  Lequita Halt, MD ED   09/18/2019 1541 09/21/2019 1716 Full Code [id number]  Oliver Barre, PA Inpatient   05/27/2017 0259 05/27/2017 1426 Full Code [id number]  Doylene Canning, MD ED   06/04/2016 1609 06/10/2016 1529 Full Code [id number]  Shon Millet, DO Inpatient   05/31/2016 2057 06/02/2016 1800 Full Code [id number]  Toy Baker, MD Inpatient   07/06/2012 0021 07/07/2012 2225 Full Code [id number]  Kalman Drape, MD ED       Prognosis:  Unable to determine  Discharge Planning: To Be Determined  Care plan was discussed with patient and family  Thank you for allowing the Palliative Medicine Team to assist in the care of this patient.   Time In: 1000 Time Out: 1055 Total Time 55 Prolonged Time Billed No   Darell Saputo Domingo Cocking, MD  Please contact Palliative Medicine Team phone at [phone] for questions and concerns.

## 2021-11-14 NOTE — Progress Notes (Signed)
Progress Note   Patient: Corey [name] PJA:[id number] DOB: 12/17/1945 DOA: 11/03/2021     11 DOS: the patient was seen and examined on 11/14/2021   Brief hospital course: Corey [name] is a 76 y.o. male with PMH stage IV NSCLC with liver mets, PAF, CAD, chronic low back p ain, anemia, anxiety, osteoarthritis, HLD, HTN, GERD, OSA on CPAP, prostate cancer who presented with approximately 4 days of progressively worsening weakness and ongoing productive cough of yellow sputum with associated pleuritic chest pain, malaise, dyspnea.  He recently deceived chemotherapy within the past week as well.  Due to continued symptoms of feeling poorly, he presented for further evaluation. On evaluation in the ER, he underwent CXR which showed ongoing extensive irregular opacities involving the left lung not significantly changed from prior imaging.  Prior CT chest on 10/15/2021 showed a stable dense consolidation in the lingula and left lower lobe. Notable labs included WBC 2.8, platelet count 158.  Sodium 128.  BNP 187. There was concern for possible pneumonia and he was started on vancomycin and cefepime. He also developed A. fib with RVR and was started on a Cardizem drip.  Assessment and Plan: * Atrial fibrillation with RVR ([place])- (present on admission) - on Tikosyn, Toprol, and Xarelto at home - Patient noted to be in A. fib with RVR on admission - cardiology consulted for further assistance given complexity of med regimen and prolonged QTc - Given digoxin load on 2/7.  - Per Cardiology, plan to wean to PO cardizem today -Now on PO amiodarone per Cardiology, would not continue long-term -can consider DCCV in the future if pt remains in afib -metoprolol increased to 100mg  bid per Cardiology -Xarelto continued  Dysarthria - acutely happened overnight of 2/6 with concern for a stroke. 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% left ICA stenosis which may or may not need outpatient follow up pending clinical course but  suspect we are heading in a hospice direction at this point -Other considered etiology for this may be due to the overall progressive decline from his malignancy - no obvious stroke on [facility]; does have chronic left parietal lobe infarct noted -Follow-up MRI brain noted to be unremarkable, now on Xarelto today  Stasis dermatitis - more prominent on LLE; no overt cellulitis signs as nontender as well - B/L LE duplex negative for DVT as well, performed 11/06/21 - likely caused by ongoing LE edema - some improvement in LE edema with lasix as well; will try and apply TED hose soon too   Prolonged Q-T interval on ECG - tikosyn now officially discontinued per cardiology - continue monitoring electrolytes; goal K>4, Mg>2  Macrocytic anemia- (present on admission) - no overt bleeding - downtrend suspected in setting of chemo recently   Leukopenia- (present on admission) - likely multifactorial from underlying infection plus recent chemo  -resolved  CAP (community acquired pneumonia)- (present on admission) - ongoing LLL consolidation which may be acutely worse on admission; symptomatically he felt worse over the past ~1 week prior to admission - s/p vanc and cefepime  - to complete flagyl on 2/10 - flagyl completed 2/10 - follow up cultures (blood cultures remain negative)  Non-small cell carcinoma of left lung, stage 4 ([place])- (present on admission) - follows outpatient with Dr. [name] -Palliative Care consulted and following -Plan for possible rehab at time of d/c. PT/OT consulted  Hyponatremia- (present on admission) - appeared clinically volume depleted on admission - unfortunately edema was a big issue in LE; given hypoalbuminemia he  is likely 3rd spacing. FeNa calculated <<1% on 11/06/21 consistent with intravascular volume depletion and worsening hyponatremia at that time - pt is s/p albumin - Na had improved with albumin and lasix; cardiology also giving this intermittently  -  repeat BMP in AM  OSA on CPAP - continue nightly CPAP  GERD (gastroesophageal reflux disease)- (present on admission) - continue PPI  Dyslipidemia- (present on admission) - Continue Lipitor  Benign essential HTN- (present on admission) - BP stable and controlled at this time - continue monitoring   Coronary artery disease- (present on admission) - Continue Lipitor, BB -On Xarelto for underlying A. fib  Sepsis (HCC)-resolved as of 11/08/2021, (present on admission) - neutropenia, tachycardia, tachypnea, lung source suspected - see pna treatment      Subjective: Reports feeling better today. States he slept well overnight. Eager to start working with PT  Physical Exam: Vitals:   11/14/21 0748 11/14/21 0800 11/14/21 1000 11/14/21 1200  BP:  120/74 (!) 99/57 135/87  Pulse:  64 65 68  Resp:  18 (!) 23 17  Temp:  97.7 F (36.5 C)  97.7 F (36.5 C)  TempSrc:  Oral  Oral  SpO2: 100% 96% 99% 100%   General exam: Conversant, in no acute distress Respiratory system: normal chest rise, clear, no audible wheezing Cardiovascular system: regular rhythm, s1-s2 Gastrointestinal system: Nondistended, nontender, pos BS Central nervous system: No seizures, no tremors Extremities: No cyanosis, no joint deformities Skin: No rashes, no pallor Psychiatry: Affect normal // no auditory hallucinations   Data Reviewed:  Labs reviewed  Family Communication: Pt in room, family not at bedside  Disposition: Status is: Inpatient Remains inpatient appropriate because: Severity of illness    Planned Discharge Destination: Barriers to discharge: amiodarone gtt, dilt gtt, cardiology cont to follow      Author: Marylu Lund, MD 11/14/2021 1:24 PM  For on call review [email or web address].

## 2021-11-14 NOTE — Progress Notes (Addendum)
°  Amiodarone Drug - Drug Interaction Consult Note  Recommendations: -Cardiology planning for short course of PO amiodarone given underlying lung malignancy.  Continue to monitor patient for signs/symptoms of: rhabdomyolysis, bleeding, inc bradycardia.   Amiodarone is metabolized by the cytochrome P450 system and therefore has the potential to cause many drug interactions. Amiodarone has an average plasma half-life of 50 days (range 20 to 100 days).   There is potential for drug interactions to occur several weeks or months after stopping treatment and the onset of drug interactions may be slow after initiating amiodarone.   [x]  Statins: Increased risk of myopathy. Simvastatin- restrict dose to 20mg  daily. Other statins: counsel patients to report any muscle pain or weakness immediately.  [x]  Anticoagulants: Amiodarone can increase anticoagulant effect. Consider warfarin dose reduction. Patients should be monitored closely and the dose of anticoagulant altered accordingly, remembering that amiodarone levels take several weeks to stabilize.  []  Antiepileptics: Amiodarone can increase plasma concentration of phenytoin, the dose should be reduced. Note that small changes in phenytoin dose can result in large changes in levels. Monitor patient and counsel on signs of toxicity.  []  Beta blockers: increased risk of bradycardia, AV block and myocardial depression. Sotalol - avoid concomitant use.  [x]   Calcium channel blockers (diltiazem and verapamil): increased risk of bradycardia, AV block and myocardial depression.  []   Cyclosporine: Amiodarone increases levels of cyclosporine. Reduced dose of cyclosporine is recommended.  []  Digoxin dose should be halved when amiodarone is started.  []  Diuretics: increased risk of cardiotoxicity if hypokalemia occurs.  []  Oral hypoglycemic agents (glyburide, glipizide, glimepiride): increased risk of hypoglycemia. Patient's glucose levels should be monitored  closely when initiating amiodarone therapy.   []  Drugs that prolong the QT interval:  Torsades de pointes risk may be increased with concurrent use - avoid if possible.  Monitor QTc, also keep magnesium/potassium WNL if concurrent therapy can't be avoided.  Antibiotics: e.g. fluoroquinolones, erythromycin.  Antiarrhythmics: e.g. quinidine, procainamide, disopyramide, sotalol.  Antipsychotics: e.g. phenothiazines, haloperidol.   Lithium, tricyclic antidepressants, and methadone. Thank Orland Jarred  11/14/2021 10:48 AM

## 2021-11-14 NOTE — Progress Notes (Signed)
Progress Note  Patient Name: Corey [name] Date of Encounter: 11/14/2021  [facility] Cardiologist: Mertie Moores, MD   Subjective   Feeling better. Breathing improving.   HR stable in Aflutter 80-100s  Inpatient Medications    Scheduled Meds:  acidophilus  1 capsule Oral Daily   amiodarone  400 mg Oral BID   arformoterol  15 mcg Nebulization BID   atorvastatin  40 mg Oral QHS   budesonide  0.5 mg Nebulization BID   carbidopa-levodopa  1 tablet Oral 4 times per day   Carbinoxamine Maleate  6 mg Oral BID   Chlorhexidine Gluconate Cloth  6 each Topical Daily   diltiazem  180 mg Oral Daily   feeding supplement  237 mL Oral TID BM   fenofibrate  160 mg Oral Q1400   fluticasone  2 spray Each Nare Daily   folic acid  1 mg Oral Daily   guaiFENesin  1,200 mg Oral BID   ipratropium  2 spray Each Nare q AM   lactulose  10 g Oral BID   levalbuterol  0.63 mg Nebulization BID   mouth rinse  15 mL Mouth Rinse BID   metoprolol tartrate  100 mg Oral BID   multivitamin with minerals  1 tablet Oral Daily   pantoprazole  40 mg Oral Daily   polyethylene glycol  17 g Oral BID   predniSONE  40 mg Oral Q breakfast   Followed by   Derrill Memo ON 11/17/2021] predniSONE  30 mg Oral Q breakfast   Followed by   Derrill Memo ON 11/24/2021] predniSONE  20 mg Oral Q breakfast   Followed by   Derrill Memo ON 12/01/2021] predniSONE  10 mg Oral Q breakfast   Followed by   Derrill Memo ON 12/08/2021] predniSONE  5 mg Oral Q breakfast   revefenacin  175 mcg Nebulization Daily   rivaroxaban  20 mg Oral QPM   senna-docusate  1 tablet Oral BID   sodium chloride flush  3 mL Intravenous Q12H   sorbitol  30 mL Oral Daily   tamsulosin  0.4 mg Oral QPC supper   topiramate  100 mg Oral BID   Continuous Infusions:  sodium chloride Stopped (11/06/21 1119)   sodium chloride Stopped (11/10/21 0152)   sodium chloride Stopped (11/13/21 1400)   diltiazem (CARDIZEM) infusion Stopped (11/14/21 1305)   PRN Meds: sodium chloride,  sodium chloride, acetaminophen **OR** acetaminophen, ALPRAZolam, alum & mag hydroxide-simeth, benzonatate, dextromethorphan, levalbuterol, lip balm, metoCLOPramide (REGLAN) injection, nitroGLYCERIN, sodium chloride flush   Vital Signs    Vitals:   11/14/21 1500 11/14/21 1600 11/14/21 1700 11/14/21 1800  BP: 125/78 131/77 137/61 (!) 146/82  Pulse: (!) 48 (!) 101 90 92  Resp: (!) 28 17 (!) 22 (!) 22  Temp:  98 F (36.7 C)    TempSrc:  Oral    SpO2: 99% 99% 100% 99%    Intake/Output Summary (Last 24 hours) at 11/14/2021 1955 Last data filed at 11/14/2021 1700 Gross per 24 hour  Intake 261.04 ml  Output 2750 ml  Net -2488.96 ml    Last 3 Weights 10/28/2021 10/23/2021 10/20/2021  Weight (lbs) 228 lb 8 oz 229 lb 12.8 oz 229 lb 1.6 oz  Weight (kg) 103.647 kg 104.237 kg 103.919 kg      Telemetry    Aflutter with HR better controlled 80-100s- Personally Reviewed  ECG    No new tracing- Personally Reviewed  Physical Exam   GEN: No acute distress.   Neck: No JVD  Cardiac: Irregularly, irregular Respiratory: CTAB, no wheezes GI: Soft, nontender, non-distended  MS: Trace edema, warm Neuro:  Nonfocal  Psych: Normal affect   Labs    High Sensitivity Troponin:  No results for input(s): TROPONINIHS in the last 720 hours.   Chemistry Recent Labs  Lab 11/10/21 0050 11/10/21 0316 11/12/21 0400 11/13/21 0310 11/14/21 0505  NA 129*   < > 133* 132* 133*  K 3.4*   < > 4.1 4.3 4.0  CL 93*   < > 97* 97* 100  CO2 27   < > 30 28 28   GLUCOSE 113*   < > 131* 114* 100*  BUN 10   < > 11 16 19   CREATININE 0.78   < > 0.74 0.82 0.79  CALCIUM 8.5*   < > 8.7* 8.8* 8.8*  MG  --    < > 1.9 1.9 1.9  PROT 6.0*  --   --   --   --   ALBUMIN 2.7*  --   --   --   --   AST 24  --   --   --   --   ALT 5  --   --   --   --   ALKPHOS 49  --   --   --   --   BILITOT 0.7  --   --   --   --   GFRNONAA >60   < > >60 >60 >60  ANIONGAP 9   < > 6 7 5    < > = values in this interval not displayed.      Lipids No results for input(s): CHOL, TRIG, HDL, LABVLDL, LDLCALC, CHOLHDL in the last 168 hours.  Hematology Recent Labs  Lab 11/12/21 0400 11/13/21 0310 11/14/21 0505  WBC 7.6 8.5 8.1  RBC 2.51* 2.57* 2.66*  HGB 8.4* 8.7* 9.0*  HCT 26.1* 26.9* 28.1*  MCV 104.0* 104.7* 105.6*  MCH 33.5 33.9 33.8  MCHC 32.2 32.3 32.0  RDW 14.6 14.9 15.1  PLT 127* 150 185    Thyroid No results for input(s): TSH, FREET4 in the last 168 hours.  BNP No results for input(s): BNP, PROBNP in the last 168 hours.   DDimer No results for input(s): DDIMER in the last 168 hours.   Radiology    No results found.  Cardiac Studies   2D echo 08/2021  1. Left ventricular ejection fraction, by estimation, is 65 to 70%. The  left ventricle has normal function. The left ventricle has no regional  wall motion abnormalities. There is mild left ventricular hypertrophy.  Left ventricular diastolic parameters  are indeterminate.   2. Right ventricular systolic function mild to moderately reduced. The  right ventricular size is mildly enlarged. There is normal pulmonary  artery systolic pressure.   3. Trivial mitral valve regurgitation.   4. The aortic valve is tricuspid. Aortic valve regurgitation is not  visualized. Mild aortic valve sclerosis is present, with no evidence of  aortic valve stenosis.   5. Aortic dilatation noted. There is mild dilatation of the ascending  aorta, measuring 40 mm.   6. The inferior vena cava is normal in size with greater than 50%  respiratory variability, suggesting right atrial pressure of 3 mmHg.  MRI Head 11/11/21: IMPRESSION: 1. No acute intracranial infarct or other abnormality. 2. Chronic left parietal infarct. 3. Underlying mild to moderate chronic microvascular ischemic disease.   Patient Profile     76 y.o. male with CAD s/p remote  PCIs of RCA 2004 and 2007 with otherwise mild nonbstructive CAD, stage 4 lung CA, CVA, paroxysmal atrial fibrillation on Tikosyn,  ILR in place, QT prolongation, HTN, HLD, prostate CA, OSA on CPAP, mild carotid artery disease (6-16% RICA, 83-72% LICA by duplex 90/2111), prior anemia requiring transfusions (felt chemo related), mild dilation of ascending aorta who presented with weakness, cough and DOE found to have possible CAP and afib with RVR for which cardiology has been consulted.   Assessment & Plan    #Atrial fibrillation with RVR Recent ILR with 0% Afib and thus suspect RVR now driven by acute ilness. Tikosyn stopped this admission due to prolonged QT and hyponatremia. Was placed on amiodarone bolus +gtt and then temporarily on PO amiodarone but developed recurrent RVR and therefore amiodarone gtt was restarted. Can consider DCCV in the future if remains in Afib, however, given acute illness, he is unlikely to maintain rhythm at this time. -Increase dilt to 240mg  daily -Continue 400mg  BIDx7 days and then 200mg  daily thereafter for another 7 days; will not continue long-term given underlying lung malignancy -Continue metop 100mg  BID -Continue xarelto for [facility] -Can plan for DCCV if remains in Afib as out-patient after 3 weeks uninterrupted AC if within [place]  #Possible CAP vs pneumonitis #Lung cancer stage IV Respiratory status improved. S/p ABX course. -Management per IM -Palliative following, appreciate recommendations  #Acute dysarthria: #?TIA Patient with episode of expressive aphasia on 2/6 concern for acute stroke vs TIA. CT head without acute abnormality. CTA head and neck with 55-20% LICA but no large vessel occlusion. MRI head with no acute intracranial infarct but with chronic left parietal infarct and mild-to-moderate chronic microvascular disease. Symptoms have since resolved. -Xarelto resumed -Recommended for vascular evaluation for 80-22% LICA  #Possible PE noted on CTA: Noted to have possible filling defect on CTA head and neck that was obtained due to expressive aphasia as above. -Resumed on  xarelto  #Hyponatremia and pancytopenia: Likely related to underlying malignancy.  -Management per primary  #Suspected Diastolic HF: #RV Dysfunction: #LE edema: Low albumin likely contributing to third spacing. Also may be related to mild CHF in the setting of Afib with RVR. Now improving and has not required further diuresis. -Continue compression socks -Currently euvolemic; no diuresis needed at this time  Cardiology will sign-off. Will arrange for out-patient follow-up. Continue current medications as detailed above.    For questions or updates, please contact [place] Please consult [email or web address] for contact info under        Signed, Freada Bergeron, MD  11/14/2021, 7:55 PM

## 2021-11-15 DIAGNOSIS — I2510 Atherosclerotic heart disease of native coronary artery without angina pectoris: Secondary | ICD-10-CM | POA: Diagnosis not present

## 2021-11-15 DIAGNOSIS — I4891 Unspecified atrial fibrillation: Secondary | ICD-10-CM | POA: Diagnosis not present

## 2021-11-15 LAB — CBC WITH DIFFERENTIAL/PLATELET
Abs Immature Granulocytes: 0.53 10*3/uL — ABNORMAL HIGH (ref 0.00–0.07)
Basophils Absolute: 0.1 10*3/uL (ref 0.0–0.1)
Basophils Relative: 1 %
Eosinophils Absolute: 0.1 10*3/uL (ref 0.0–0.5)
Eosinophils Relative: 1 %
HCT: 29.3 % — ABNORMAL LOW (ref 39.0–52.0)
Hemoglobin: 9 g/dL — ABNORMAL LOW (ref 13.0–17.0)
Immature Granulocytes: 7 %
Lymphocytes Relative: 8 %
Lymphs Abs: 0.7 10*3/uL (ref 0.7–4.0)
MCH: 33.1 pg (ref 26.0–34.0)
MCHC: 30.7 g/dL (ref 30.0–36.0)
MCV: 107.7 fL — ABNORMAL HIGH (ref 80.0–100.0)
Monocytes Absolute: 0.8 10*3/uL (ref 0.1–1.0)
Monocytes Relative: 10 %
Neutro Abs: 5.8 10*3/uL (ref 1.7–7.7)
Neutrophils Relative %: 73 %
Platelets: 195 10*3/uL (ref 150–400)
RBC: 2.72 MIL/uL — ABNORMAL LOW (ref 4.22–5.81)
RDW: 15.9 % — ABNORMAL HIGH (ref 11.5–15.5)
WBC: 7.9 10*3/uL (ref 4.0–10.5)
nRBC: 0.6 % — ABNORMAL HIGH (ref 0.0–0.2)

## 2021-11-15 LAB — COMPREHENSIVE METABOLIC PANEL
ALT: 9 U/L (ref 0–44)
AST: 25 U/L (ref 15–41)
Albumin: 2.7 g/dL — ABNORMAL LOW (ref 3.5–5.0)
Alkaline Phosphatase: 59 U/L (ref 38–126)
Anion gap: 6 (ref 5–15)
BUN: 20 mg/dL (ref 8–23)
CO2: 28 mmol/L (ref 22–32)
Calcium: 8.8 mg/dL — ABNORMAL LOW (ref 8.9–10.3)
Chloride: 99 mmol/L (ref 98–111)
Creatinine, Ser: 0.66 mg/dL (ref 0.61–1.24)
GFR, Estimated: >60 mL/min (ref >60)
Glucose, Bld: 94 mg/dL (ref 70–99)
Potassium: 3.8 mmol/L (ref 3.5–5.1)
Sodium: 133 mmol/L — ABNORMAL LOW (ref 135–145)
Total Bilirubin: 0.2 mg/dL — ABNORMAL LOW (ref 0.3–1.2)
Total Protein: 6.1 g/dL — ABNORMAL LOW (ref 6.5–8.1)

## 2021-11-15 MED ORDER — AMIODARONE HCL 200 MG PO TABS
200.0000 mg | ORAL_TABLET | Freq: Every day | ORAL | Status: DC
  Administered 2021-11-20: 200 mg via ORAL
  Filled 2021-11-15: qty 1

## 2021-11-15 MED ORDER — DILTIAZEM HCL ER COATED BEADS 240 MG PO CP24
240.0000 mg | ORAL_CAPSULE | Freq: Every day | ORAL | Status: DC
  Administered 2021-11-15 – 2021-11-19 (×5): 240 mg via ORAL
  Filled 2021-11-15: qty 1
  Filled 2021-11-15: qty 2
  Filled 2021-11-15 (×3): qty 1

## 2021-11-15 MED ORDER — AMIODARONE HCL 200 MG PO TABS
400.0000 mg | ORAL_TABLET | Freq: Two times a day (BID) | ORAL | Status: AC
  Administered 2021-11-15 – 2021-11-19 (×10): 400 mg via ORAL
  Filled 2021-11-15 (×10): qty 2

## 2021-11-15 NOTE — Progress Notes (Signed)
PT Cancellation Note  Patient Details Name: Corey [name] MRN: [id number] DOB: 08/25/1946   Cancelled Treatment:     PT order received but deferred at request of pt and family 2* fatigue after bathing and up in chair for 3 hrs prior to transfer up from ICU.  Will follow in am.   Elham Fini 11/15/2021, 3:56 PM

## 2021-11-15 NOTE — Progress Notes (Signed)
Progress Note   Patient: Corey [name] DJS:[id number] DOB: 1946-01-03 DOA: 11/03/2021     12 DOS: the patient was seen and examined on 11/15/2021   Brief hospital course: BRAILYN DELMAN is a 76 y.o. male with PMH stage IV NSCLC with liver mets, PAF, CAD, chronic low back p ain, anemia, anxiety, osteoarthritis, HLD, HTN, GERD, OSA on CPAP, prostate cancer who presented with approximately 4 days of progressively worsening weakness and ongoing productive cough of yellow sputum with associated pleuritic chest pain, malaise, dyspnea.  He recently deceived chemotherapy within the past week as well.  Due to continued symptoms of feeling poorly, he presented for further evaluation. On evaluation in the ER, he underwent CXR which showed ongoing extensive irregular opacities involving the left lung not significantly changed from prior imaging.  Prior CT chest on 10/15/2021 showed a stable dense consolidation in the lingula and left lower lobe. Notable labs included WBC 2.8, platelet count 158.  Sodium 128.  BNP 187. There was concern for possible pneumonia and he was started on vancomycin and cefepime. He also developed A. fib with RVR and was started on a Cardizem drip.  Assessment and Plan: * Atrial fibrillation with RVR ([place])- (present on admission) - on Tikosyn, Toprol, and Xarelto at home - Patient noted to be in A. fib with RVR on admission - cardiology consulted for further assistance given complexity of med regimen and prolonged QTc - Given digoxin load on 2/7.  - Weaned of cardizem gtt, up to 240mg  daily -Now on PO amiodarone per Cardiology, would not continue long-term. Currently on 400mg  bid x 7 days total, then 200mg  daily x 7 more days -Per Cardiology, plan for DCCV if patient remains in afib as outpt after 3 weeks of uninterrupted AC -metoprolol cont at 100mg  bid per Cardiology -Xarelto continued  Dysarthria - acutely happened overnight of 2/6 with concern for a stroke. 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; no overt cellulitis signs as nontender as well - B/L LE duplex negative for DVT as well, performed 11/06/21 - likely caused by ongoing LE edema - some improvement in LE edema with lasix as well; will try and apply TED hose soon too   Prolonged Q-T interval on ECG - tikosyn now officially discontinued per cardiology - continue monitoring electrolytes; goal K>4, Mg>2  Macrocytic anemia- (present on admission) - no overt bleeding - downtrend suspected in setting of chemo recently   Leukopenia- (present on admission) - likely multifactorial from underlying infection plus recent chemo  -resolved  CAP (community acquired pneumonia)- (present on admission) - ongoing LLL consolidation which may be acutely worse on admission; symptomatically he felt worse over the past ~1 week prior to admission - s/p vanc and cefepime  - to complete flagyl on 2/10 - flagyl completed 2/10 - cultures had remained neg  Non-small cell carcinoma of left lung, stage 4 ([place])- (present on admission) - follows outpatient with Dr. [name] -Palliative Care consulted and following. Pt has improved markedly thus far and pt is keen on improving further -Pt has not  ruled out possibility of pursuing chemo moving forward -Plan for possible rehab at time of d/c. Therapy recs for CIR. Have placed consult  Hyponatremia- (present on admission) - appeared clinically volume depleted on admission - unfortunately edema was a big issue in LE; given hypoalbuminemia he is likely 3rd  spacing. FeNa calculated <<1% on 11/06/21 consistent with intravascular volume depletion and worsening hyponatremia at that time - pt is s/p albumin - Na had improved with albumin and lasix; cardiology also giving this intermittently  - repeat BMP in AM  OSA on CPAP - continue nightly CPAP  GERD (gastroesophageal reflux disease)- (present on admission) - continue PPI  Dyslipidemia- (present on admission) - Continue Lipitor  Benign essential HTN- (present on admission) - BP stable and controlled at this time - continue monitoring   Coronary artery disease- (present on admission) - Continue Lipitor, BB -On Xarelto for underlying A. fib  Sepsis (HCC)-resolved as of 11/08/2021, (present on admission) - neutropenia, tachycardia, tachypnea, lung source suspected - see pna treatment      Subjective: States feeling better. Eager for heab  Physical Exam: Vitals:   11/15/21 0910 11/15/21 1000 11/15/21 1212 11/15/21 1300  BP:   118/67   Pulse:      Resp:      Temp:  97.9 F (36.6 C) (!) 96.9 F (36.1 C) 98.9 F (37.2 C)  TempSrc:  Oral Oral Oral  SpO2: 96%      General exam: Awake, laying in bed, in nad Respiratory system: Normal respiratory effort, no wheezing Cardiovascular system: regular rate, s1, s2 Gastrointestinal system: Soft, nondistended, positive BS Central nervous system: CN2-12 grossly intact, strength intact Extremities: Perfused, no clubbing Skin: Normal skin turgor, no notable skin lesions seen Psychiatry: Mood normal // no visual hallucinations   Data Reviewed:  Labs reviewed  Family Communication: Pt in room, family not at bedside  Disposition: Status is: Inpatient Remains inpatient appropriate because: Severity of illness    Planned Discharge Destination: Rehab      Author: Marylu Lund, MD 11/15/2021 2:12 PM  For on call review [email or web address].

## 2021-11-16 DIAGNOSIS — L8990 Pressure ulcer of unspecified site, unspecified stage: Secondary | ICD-10-CM | POA: Insufficient documentation

## 2021-11-16 LAB — COMPREHENSIVE METABOLIC PANEL
ALT: 6 U/L (ref 0–44)
AST: 22 U/L (ref 15–41)
Albumin: 2.5 g/dL — ABNORMAL LOW (ref 3.5–5.0)
Alkaline Phosphatase: 43 U/L (ref 38–126)
Anion gap: 6 (ref 5–15)
BUN: 22 mg/dL (ref 8–23)
CO2: 27 mmol/L (ref 22–32)
Calcium: 8.6 mg/dL — ABNORMAL LOW (ref 8.9–10.3)
Chloride: 100 mmol/L (ref 98–111)
Creatinine, Ser: 0.77 mg/dL (ref 0.61–1.24)
Glucose, Bld: 109 mg/dL — ABNORMAL HIGH (ref 70–99)
Potassium: 4.2 mmol/L (ref 3.5–5.1)
Sodium: 133 mmol/L — ABNORMAL LOW (ref 135–145)
Total Bilirubin: 0.1 mg/dL — ABNORMAL LOW (ref 0.3–1.2)
Total Protein: 5.2 g/dL — ABNORMAL LOW (ref 6.5–8.1)

## 2021-11-16 LAB — GLUCOSE, CAPILLARY: Glucose-Capillary: 100 mg/dL — ABNORMAL HIGH (ref 70–99)

## 2021-11-16 NOTE — Progress Notes (Signed)
Occupational Therapy Treatment Patient Details Name: Corey [name] MRN: [id number] DOB: Jul 11, 1946 Today's Date: 11/16/2021   History of present illness 76yo male who presented on 10/25 with L LE edema and generalized weakness. Also with recent hx of PNA. Admitted with L LE cellulitis. PMH NSCLC on chemo, Afib, CAD, chronic LBP with radiculopathy, HLD, HTN, prostate CA, CVA, ACDF, back surgery, L rotator cuff repair, TKA   OT comments  Patient progressing slowly but steadily and showed improved out of bed tolerance for ADLs with attempt to stand at sink for grooming, however due to elevated HR to 130s and SpO2 desaturation to 84% on 4L and pt fatigue, pt performed ADLs at chair level today, compared to previous session. Pt does recover his HR and SpO2 with pacing and rest breaks throughout the session.  Patient remains limited by generalized weakness and decreased activity tolerance with body tremors and stooped posture when standing and transferring, along with deficits noted below. Pt continues to demonstrate good rehab potential and would benefit from continued skilled OT to increase safety and independence with ADLs and functional transfers to allow pt to return home safely and reduce caregiver burden and fall risk.    Recommendations for follow up therapy are one component of a multi-disciplinary discharge planning process, led by the attending physician.  Recommendations may be updated based on patient status, additional functional criteria and insurance authorization.    Follow Up Recommendations  Acute inpatient rehab (3hours/day)    Assistance Recommended at Discharge Frequent or constant Supervision/Assistance  Patient can return home with the following  A little help with walking and/or transfers;A lot of help with bathing/dressing/bathroom;Help with stairs or ramp for entrance;Assistance with cooking/housework   Equipment Recommendations  None recommended by OT    Recommendations  for Other Services Rehab consult    Precautions / Restrictions Precautions Precautions: Fall Restrictions Weight Bearing Restrictions: No       Mobility Bed Mobility Overal bed mobility: Needs Assistance Bed Mobility: Supine to Sit     Supine to sit: Supervision, HOB elevated          Transfers Overall transfer level: Needs assistance   Transfers: Sit to/from Stand (from EOB, from [facility] and from arm chair.) Sit to Stand: Min assist, Mod assist           General transfer comment: Mod As from EOB, Min As from [facility] and arm chair.     Balance Overall balance assessment: Needs assistance   Sitting balance-Leahy Scale: Good     Standing balance support: Reliant on assistive device for balance Standing balance-Leahy Scale: Poor Standing balance comment: reliant on walker and tremulous in standing.                           ADL either performed or assessed with clinical judgement   ADL Overall ADL's : Needs assistance/impaired     Grooming: Standing;Sitting;Wash/dry hands;Oral care;Set up Grooming Details (indicate cue type and reason): Pt stood at sink initially for hand hygiene but became too fatigued after toileting on BSC and lowered to arm chair anterior to sink. Pt then completed hand washing and oral care with setup in chair position.                 Toilet Transfer: Agricultural engineer (2 wheels);BSC/3in1;Cueing for sequencing   Toileting- Clothing Manipulation and Hygiene: Total assistance;Sit to/from stand Toileting - Clothing Manipulation Details (indicate cue type and reason): Pt very tremulous  after toileting and requested total assist for posterior peri hygiene while pt help RW with BUEs in standing.  Performed x 2 as pt found to be slightly soiled when rising from arm chair after rest break.     Functional mobility during ADLs: Minimal assistance;Rolling walker (2 wheels)      Extremity/Trunk Assessment         Cervical /  Trunk Assessment Cervical / Trunk Assessment: Other exceptions Cervical / Trunk Exceptions: Stooped when standing to RW    Vision Baseline Vision/History: 1 Wears glasses Vision Assessment?: No apparent visual deficits   Perception     Praxis      Cognition Arousal/Alertness: Awake/alert Behavior During Therapy: WFL for tasks assessed/performed Overall Cognitive Status: Within Functional Limits for tasks assessed                                          Exercises      Shoulder Instructions       General Comments      Pertinent Vitals/ Pain       Pain Assessment Pain Assessment: No/denies pain  Home Living                                          Prior Functioning/Environment              Frequency  Min 2X/week        Progress Toward Goals  OT Goals(current goals can now be found in the care plan section)  Progress towards OT goals: Progressing toward goals  Acute Rehab OT Goals Patient Stated Goal: Go back to inpatient rehab, get stronger and improve activity tolerance. OT Goal Formulation: With patient/family Time For Goal Achievement: 11/28/21 Potential to Achieve Goals: Good  Plan      Co-evaluation                 AM-PAC OT "6 Clicks" Daily Activity     Outcome Measure   Help from another person eating meals?: None Help from another person taking care of personal grooming?: A Little Help from another person toileting, which includes using toliet, bedpan, or urinal?: A Lot Help from another person bathing (including washing, rinsing, drying)?: A Lot Help from another person to put on and taking off regular upper body clothing?: A Little Help from another person to put on and taking off regular lower body clothing?: A Lot 6 Click Score: 16    End of Session Equipment Utilized During Treatment: Rolling walker (2 wheels);Oxygen  OT Visit Diagnosis: Unsteadiness on feet (R26.81);Muscle weakness  (generalized) (M62.81)   Activity Tolerance Patient tolerated treatment well   Patient Left in chair;with call bell/phone within reach;with family/visitor present   Nurse Communication Mobility status        Time: 1010-1053 OT Time Calculation (min): 43 min  Charges: OT General Charges $OT Visit: 1 Visit OT Treatments $Self Care/Home Management : 8-22 mins $Therapeutic Activity: 23-37 mins  Anderson [place], [place] Office: [phone] 11/16/2021  Julien Girt 11/16/2021, 11:10 AM

## 2021-11-16 NOTE — Progress Notes (Signed)
Pt placed on cpap 

## 2021-11-16 NOTE — Evaluation (Addendum)
Physical Therapy Evaluation Patient Details Name: Corey [name] MRN: [id number] DOB: 10/04/1946 Today's Date: 11/16/2021  History of Present Illness  76yo male who presented on 10/25 with L LE edema and generalized weakness. Also with recent hx of PNA. Admitted with L LE cellulitis. PMH NSCLC on chemo, Afib, CAD, chronic LBP with radiculopathy, HLD, HTN, prostate CA, CVA, ACDF, back surgery, L rotator cuff repair, TKA  Clinical Impression  On eval, pt was Min A for mobility. He was able to take a few steps in the room with a RW (stepping over to bed, side steps along bedside). Remained on [place] O2. Pt presents with general weakness, decreased activity tolerance, and impaired gait and balance. He does fatigue fairly easily with activity. Discussed d/c plan-pt would like to have another inpatient rehab stay to regain PLOF and independence. Will plan to follow and progress activity as tolerated during hospital stay.        Recommendations for follow up therapy are one component of a multi-disciplinary discharge planning process, led by the attending physician.  Recommendations may be updated based on patient status, additional functional criteria and insurance authorization.  Follow Up Recommendations Acute inpatient rehab (3hours/day)    Assistance Recommended at Discharge Frequent or constant Supervision/Assistance  Patient can return home with the following  A little help with walking and/or transfers;A little help with bathing/dressing/bathroom;Assist for transportation;Help with stairs or ramp for entrance;Assistance with cooking/housework    Equipment Recommendations None recommended by PT  Recommendations for Other Services       Functional Status Assessment Patient has had a recent decline in their functional status and demonstrates the ability to make significant improvements in function in a reasonable and predictable amount of time.     Precautions / Restrictions  Precautions Precautions: Fall Precaution Comments: O2 dep at baseline Restrictions Weight Bearing Restrictions: No      Mobility  Bed Mobility Overal bed mobility: Needs Assistance Bed Mobility: Sit to Supine       Sit to supine: Supervision   General bed mobility comments: Supv for safety, lines. Increased time.    Transfers Overall transfer level: Needs assistance Equipment used: Rolling walker (2 wheels) Transfers: Sit to/from Stand, Bed to chair/wheelchair/BSC Sit to Stand: Min assist   Step pivot transfers: Min assist       General transfer comment: Assist to rise from recliner. Cues for safety, technique, hand/LE placement. Step pivot, recliner to bed, using RW. Shaky.     Ambulation/Gait  Min Assist Gait Distance (Feet): 3 Feet Assistive device: Rolling walker (2 wheels) Gait Pattern/deviations: Trunk flexed       General Gait Details: Cues for safety. Assist to steady. Remained on  O2. Dyspnea 2/4. Will plan to attempt hallway ambulation next sesson. Will likely need chair follow for safety.   Stairs            Wheelchair Mobility    Modified Rankin (Stroke Patients Only)       Balance Overall balance assessment: Needs assistance         Standing balance support: Bilateral upper extremity supported, During functional activity, Reliant on assistive device for balance Standing balance-Leahy Scale: Poor                               Pertinent Vitals/Pain Pain Assessment Pain Assessment: No/denies pain    Home Living Family/patient expects to be discharged to:: Unsure Living Arrangements: Spouse/significant other Available Help  at Discharge: Family;Available PRN/intermittently;Friend(s);Neighbor Type of Home: House Home Access: Stairs to enter Entrance Stairs-Rails: None Entrance Stairs-Number of Steps: 2 + 2 Alternate Level Stairs-Number of Steps: flight Home Layout: Two level;Bed/bath upstairs;1/2 bath on main  level Home Equipment: Rollator (4 wheels);Cane - single point;Shower Land (2 wheels);Hand held shower head;Transport chair;Adaptive equipment Additional Comments: Still has bedroom downstairs available.    Prior Function Prior Level of Function : Needs assist             Mobility Comments: using a walker in home, getting HH PT, had gotten well enough to get up 14 steps to bedroom, walk up and down the driveway ADLs Comments: Independent with ADLs, bathing at the sink     Hand Dominance        Extremity/Trunk Assessment   Upper Extremity Assessment Upper Extremity Assessment: Defer to OT evaluation    Lower Extremity Assessment Lower Extremity Assessment: Generalized weakness    Cervical / Trunk Assessment Cervical / Trunk Assessment: Other exceptions Cervical / Trunk Exceptions: Stooped when standing to RW  Communication   Communication: No difficulties  Cognition Arousal/Alertness: Awake/alert Behavior During Therapy: WFL for tasks assessed/performed Overall Cognitive Status: Within Functional Limits for tasks assessed                                          General Comments      Exercises     Assessment/Plan    PT Assessment Patient needs continued PT services  PT Problem List Decreased strength;Decreased mobility;Decreased activity tolerance;Decreased balance;Decreased knowledge of use of DME       PT Treatment Interventions DME instruction;Therapeutic activities;Gait training;Therapeutic exercise;Patient/family education;Balance training;Stair training;Functional mobility training    PT Goals (Current goals can be found in the Care Plan section)  Acute Rehab PT Goals Patient Stated Goal: to go back to inpatient rehab PT Goal Formulation: With patient Time For Goal Achievement: 11/30/21 Potential to Achieve Goals: Good    Frequency Min 3X/week     Co-evaluation               AM-PAC PT "6 Clicks" Mobility   Outcome Measure Help needed turning from your back to your side while in a flat bed without using bedrails?: A Little Help needed moving from lying on your back to sitting on the side of a flat bed without using bedrails?: A Little Help needed moving to and from a bed to a chair (including a wheelchair)?: A Little Help needed standing up from a chair using your arms (e.g., wheelchair or bedside chair)?: A Little Help needed to walk in hospital room?: A Lot Help needed climbing 3-5 steps with a railing? : A Lot 6 Click Score: 16    End of Session Equipment Utilized During Treatment: Oxygen Activity Tolerance: Patient tolerated treatment well;Patient limited by fatigue Patient left: with call bell/phone within reach;with bed alarm set   PT Visit Diagnosis: Muscle weakness (generalized) (M62.81);Difficulty in walking, not elsewhere classified (R26.2)    Time: 1339-1350 PT Time Calculation (min) (ACUTE ONLY): 11 min   Charges:   PT Evaluation $PT Eval Moderate Complexity: 1 Mod             Doreatha Massed, PT Acute Rehabilitation  Office: [phone] Pager: [phone]

## 2021-11-16 NOTE — Progress Notes (Signed)
Nutrition Follow-up  DOCUMENTATION CODES:   Not applicable  INTERVENTION:  - continue Ensure Plus High Protein TID.    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 -improving  GOAL:   Patient will meet greater than or equal to 90% of their needs -progressing  MONITOR:   PO intake, Supplement acceptance, Weight trends, Labs   ASSESSMENT:   Pt is a 76 y.o. 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   Recently documented meal completions of 20% of breakfast and 50% of lunch on 2/9; 50% of lunch and 75% of dinner on 2/10; 75% of breakfast and 100% of lunch today.  Patient shares that ate baseline he has a very good appetite. Appetite is now beginning to pick back up. He reports that he is going to be evaluated by OT and PT to determine plans post-d/c. Patient is very motivated to continue to gain strength and meet goals for completing ADLs and having as high a QOL as possible.   He reports that he has been drinking 1-2 bottles of Ensure/day.   Provided active listening as patient shared about his support system from family, friends, neighbors, and medical team.   Weight yesterday was 231 lb and weight has been stable over the past 1 month. Mild pitting edema to BLE documented in the edema section of flow sheet.    Labs reviewed; CBG: 100 mg/dl, Na: 133 mmol/l, Ca: 8.6 mg/dl.  Medications reviewed; 1  capsule risaquad/day, 1 mg folvite/day, 1 tablet multivitamin with minerals/day, 40 mg oral protonix/day, 17 g miralax BID, deltasone taper, 1 tablet senokot BID, 30 ml sorbitol/day.   Diet Order:   Diet Order             Diet regular Room service appropriate? Yes; Fluid consistency: Thin  Diet effective now                   EDUCATION NEEDS:   Education needs have been addressed  Skin:  Skin Assessment: Skin Integrity Issues: Skin Integrity Issues:: Stage I Stage I: sacrum (newly documented on 2/12) Other: ecchymosis  Last BM:  2/12 (type 7, large amount)  Height:   Ht Readings from Last 1 Encounters:  11/15/21 6\' 1"  (1.854 m)    Weight:   Wt Readings from Last 1 Encounters:  11/15/21 104.9 kg     BMI:  Body mass index is 30.51 kg/m.   Estimated Nutritional Needs:  Kcal:  2100 - 2300 Protein:  105 - 120 grams Fluid:  > 2.1 L      Jarome Matin, MS, RD, LDN Inpatient Clinical Dietitian RD pager # available in [place]  After hours/weekend pager # available in [facility]

## 2021-11-16 NOTE — TOC Initial Note (Signed)
Transition of Care [facility]) - Initial/Assessment Note    Patient Details  Name: Corey [name] MRN: [id number] Date of Birth: December 18, 1945  Transition of [facility]) CM/SW Contact:    Dessa Phi, RN Phone Number: 11/16/2021, 2:41 PM  Clinical Narrative:  Note PT recc CIR-CIr to eval.                 Expected Discharge Plan: IP Rehab Facility Barriers to Discharge: Continued Medical Work up   Patient Goals and CMS Choice Patient states their goals for this hospitalization and ongoing recovery are:: rehab CMS Medicare.gov Compare Post Acute Care list provided to:: Patient Represenative (must comment)    Expected Discharge Plan and Services Expected Discharge Plan: [place]                                              Prior Living Arrangements/Services     Patient language and need for interpreter reviewed:: Yes Do you feel safe going back to the place where you live?: Yes        Care giver support system in place?: Yes (comment)   Criminal Activity/Legal Involvement Pertinent to Current Situation/Hospitalization: No - Comment as needed  Activities of Daily Living Home Assistive Devices/Equipment: None ADL Screening (condition at time of admission) Patient's cognitive ability adequate to safely complete daily activities?: Yes Is the patient deaf or have difficulty hearing?: No Does the patient have difficulty seeing, even when wearing glasses/contacts?: No Does the patient have difficulty concentrating, remembering, or making decisions?: No Patient able to express need for assistance with ADLs?: Yes Does the patient have difficulty dressing or bathing?: Yes Independently performs ADLs?: No Does the patient have difficulty walking or climbing stairs?: Yes Weakness of Legs: Both Weakness of Arms/Hands: None  Permission Sought/Granted Permission sought to share information with : Case Manager Permission granted to share information with : Yes, Verbal  Permission Granted  Share Information with NAME: Case Manager           Emotional Assessment              Admission diagnosis:  Paroxysmal atrial fibrillation ([place]) [I48.0] Bandemia [D72.825] Atrial fibrillation with RVR ([place]) [I48.91] Patient Active Problem List   Diagnosis Date Noted   Pressure injury of skin 11/16/2021   Dysarthria 11/10/2021   Stasis dermatitis 11/06/2021   Prolonged Q-T interval on ECG 11/04/2021   Atrial fibrillation with RVR ([place]) 11/03/2021   Leukopenia 11/03/2021   Macrocytic anemia 11/03/2021   Streptococcal sore throat    Acute on chronic anemia    Supplemental oxygen dependent    Notalgia    Adjustment disorder with mixed anxiety and depressed mood    Debility 08/12/2021   Acute respiratory failure with hypoxia ([place]) 08/01/2021   Cellulitis of left leg 07/30/2021   Hypokalemia 07/30/2021   Hypomagnesemia 07/30/2021   Nausea & vomiting 07/30/2021   Cellulitis of left lower extremity    Malaise 07/28/2021   Symptomatic anemia 05/14/2021   Port-A-Cath in place 04/27/2021   Genetic testing 16/04/3709   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   COPD with acute exacerbation ([place]) 04/02/2021   Cough with hemoptysis 04/01/2021   CAP (community acquired pneumonia) 04/01/2021   Family history of prostate cancer 03/20/2021   Family history of breast cancer 03/20/2021   Family history of lung cancer 03/20/2021   Goals  of care, counseling/discussion 03/18/2021   Encounter for antineoplastic chemotherapy 03/18/2021   Encounter for antineoplastic immunotherapy 03/18/2021   Primary cancer of left lower lobe of lung ([place]) 03/07/2021   Non-small cell carcinoma of left lung, stage 4 ([place]) 03/05/2021   Malnutrition of moderate degree 02/25/2021   Pleural effusion on left 02/24/2021   Hilar mass    Reactive airway disease 11/20/2020   Allergic rhinitis with non-allergic component 09/18/2020   Heartburn 09/18/2020   Coughing 09/18/2020    Malignant neoplasm of prostate ([place]) 03/04/2020   Pre-syncope 02/27/2020   Cerebral embolism with transient ischemic attack (TIA) 02/11/2020   History of stroke 73/56/7014   Embolic stroke involving cerebral artery ([place]) 02/11/2020   Secondary hypercoagulable state ([place]) 09/10/2019   Essential tremor 06/25/2019   Chest pain 05/27/2017   Demand ischemia ([place]) 05/27/2017   Morbid obesity ([place]) 05/27/2017   Carotid artery disease ([place]) 08/30/2016   Essential hypertension 08/30/2016   Diverticulitis of intestine without perforation or abscess without bleeding    Hyponatremia 06/04/2016   Diverticulitis 05/31/2016   Iron deficiency 01/05/2016   Lumbar radiculopathy, chronic 02/04/2015   SVT (supraventricular tachycardia) ([place]) 12/03/2014   PAC (premature atrial contraction) 04/05/2014   OSA on CPAP 03/27/2014   AKI (acute kidney injury) ([place]) 12/05/2013   Nausea 12/05/2013   Abdominal pain 12/05/2013   AF (paroxysmal atrial fibrillation) ([place]) 01/05/2013   CVA (cerebral infarction) 07/06/2012   Coronary artery disease    Benign essential HTN    Dyslipidemia    Shortness of breath    GERD (gastroesophageal reflux disease)    PCP:  [name], MD Pharmacy:   [facility] - [place], [place] - [address] [place] [address] Phone: [phone] Fax: [phone]  [id number]: [phone]: [phone]    Social Determinants of Health (SDOH) Interventions    Readmission Risk Interventions Readmission Risk Prevention Plan 08/05/2021  Transportation Screening Complete  PCP or Specialist appointment within 3-5 days of discharge Complete  [place]RI or [place]  SW Recovery Care/Counseling Consult Complete  [place]  Some recent data might be  hidden

## 2021-11-16 NOTE — Progress Notes (Signed)
Inpatient Rehab Admissions Coordinator:   I met with Pt. To discuss potential CIR admit. He states interest but is aware insurance may not approve. He is amenable to SNF if insurance denies CIR.  I will open a case with his insurance and pursue for potential admit.   Clemens Catholic, [place], [place] Admissions Coordinator  [phone] ([place]) [phone] (office)

## 2021-11-16 NOTE — Progress Notes (Signed)
Palliative care brief note  I checked in briefly Corey [name] today.  He was working with OT at time of my encounter.  I also discussed with his wife in the hall.  Overall, goal is to maximize functional status through rehab.  He is hopeful for CIR but is open to SNF if CIR is not an option.  Following this, he will make final decision about next steps for long-term goals of care.  He has not completely ruled out further chemotherapy but there is also likelihood he may elect to transition home with hospice benefits depending upon how time at rehab goes.  As goals are clear, palliative care will not continue to follow daily.  Please call or reconsult if we can be of further assistance in the care of Mr. Cutting moving forward.  Micheline Rough, MD [place] Palliative Medicine Team [phone]  NO CHARGE NOTE

## 2021-11-16 NOTE — Progress Notes (Signed)
Progress Note   Patient: Corey [name] GYB:[id number] DOB: 05/15/1946 DOA: 11/03/2021     13 DOS: the patient was seen and examined on 11/16/2021   Brief hospital course: Corey [name] is a 76 y.o. male with PMH stage IV NSCLC with liver mets, PAF, CAD, chronic low back p ain, anemia, anxiety, osteoarthritis, HLD, HTN, GERD, OSA on CPAP, prostate cancer who presented with approximately 4 days of progressively worsening weakness and ongoing productive cough of yellow sputum with associated pleuritic chest pain, malaise, dyspnea.  He recently deceived chemotherapy within the past week as well.  Due to continued symptoms of feeling poorly, he presented for further evaluation. On evaluation in the ER, he underwent CXR which showed ongoing extensive irregular opacities involving the left lung not significantly changed from prior imaging.  Prior CT chest on 10/15/2021 showed a stable dense consolidation in the lingula and left lower lobe. Notable labs included WBC 2.8, platelet count 158.  Sodium 128.  BNP 187. There was concern for possible pneumonia and he was started on vancomycin and cefepime. He also developed A. fib with RVR and was started on a Cardizem drip.  Assessment and Plan: * Atrial fibrillation with RVR ([place])- (present on admission) - on Tikosyn, Toprol, and Xarelto at home - Patient noted to be in A. fib with RVR on admission - cardiology consulted for further assistance given complexity of med regimen and prolonged QTc - Given digoxin load on 2/7.  - Weaned of cardizem gtt, up to 240mg  daily -Now on PO amiodarone per Cardiology, would not continue long-term. Currently on 400mg  bid x 7 days total, then 200mg  daily x 7 more days -Per Cardiology, plan for DCCV if patient remains in afib as outpt after 3 weeks of uninterrupted AC -metoprolol cont at 100mg  bid per Cardiology -Xarelto continued -Cardiology has since signed off  Dysarthria - acutely happened overnight of 2/6 with concern for  a stroke. 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% left ICA stenosis which may or may not need outpatient follow up pending clinical course but suspect we are heading in a hospice direction at this point -Other considered etiology for this may be due to the overall progressive decline from his malignancy - no obvious stroke on [facility]; does have chronic left parietal lobe infarct noted -Follow-up MRI brain noted to be unremarkable, now on Xarelto today  Stasis dermatitis - more prominent on LLE; no overt cellulitis signs as nontender as well - B/L LE duplex negative for DVT as well, performed 11/06/21 - likely caused by ongoing LE edema - some improvement in LE edema with lasix as well; will try and apply TED hose soon too   Prolonged Q-T interval on ECG - tikosyn now officially discontinued per cardiology - continue monitoring electrolytes; goal K>4, Mg>2  Macrocytic anemia- (present on admission) - no overt bleeding - downtrend suspected in setting of chemo recently   Leukopenia- (present on admission) - likely multifactorial from underlying infection plus recent chemo  -resolved  CAP (community acquired pneumonia)- (present on admission) - ongoing LLL consolidation which may be acutely worse on admission; symptomatically he felt worse over the past ~1 week prior to admission - s/p vanc and cefepime  - flagyl completed 2/10 - cultures had remained neg  Non-small cell carcinoma of left lung, stage 4 ([place])- (present on admission) - follows outpatient with Dr. [name] -Palliative Care consulted and following. Pt has improved markedly thus far and pt is keen on improving further -Pt has not ruled  out possibility of pursuing chemo moving forward -Plan for possible rehab at time of d/c. Therapy recs for CIR. -CIR consulted  Hyponatremia- (present on admission) - appeared clinically volume depleted on admission - unfortunately edema was a big issue in LE; given hypoalbuminemia he is likely 3rd  spacing. FeNa calculated <<1% on 11/06/21 consistent with intravascular volume depletion and worsening hyponatremia at that time - pt is s/p albumin - Na had improved with albumin and lasix - repeat BMP in AM  OSA on CPAP - continue nightly CPAP  GERD (gastroesophageal reflux disease)- (present on admission) - continue PPI  Dyslipidemia- (present on admission) - Continue Lipitor  Benign essential HTN- (present on admission) - BP stable and controlled at this time - continue monitoring   Coronary artery disease- (present on admission) - Continue Lipitor, BB -On Xarelto for underlying A. fib  Sepsis (HCC)-resolved as of 11/08/2021, (present on admission) - neutropenia, tachycardia, tachypnea, lung source suspected - see pna treatment      Subjective: Reports feeling better. Eager to start rehab and get stronger  Physical Exam: Vitals:   11/16/21 0412 11/16/21 0824 11/16/21 1002 11/16/21 1104  BP: 105/80  113/70   Pulse: 91  82 94  Resp: 20     Temp: 97.9 F (36.6 C)     TempSrc:      SpO2: 100% 97%  94%  Weight:      Height:       General exam: Conversant, in no acute distress Respiratory system: normal chest rise, clear, no audible wheezing Cardiovascular system: regular rhythm, s1-s2 Gastrointestinal system: Nondistended, nontender, pos BS Central nervous system: No seizures, no tremors Extremities: No cyanosis, no joint deformities Skin: No rashes, no pallor Psychiatry: Affect normal // no auditory hallucinations   Data Reviewed:  Labs reviewed  Family Communication: Pt in room, family not at bedside  Disposition: Status is: Inpatient Remains inpatient appropriate because: Severity of illness    Planned Discharge Destination: Rehab      Author: Marylu Lund, MD 11/16/2021 1:51 PM  For on call review [email or web address].

## 2021-11-17 LAB — COMPREHENSIVE METABOLIC PANEL
ALT: 12 U/L (ref 0–44)
AST: 19 U/L (ref 15–41)
Albumin: 2.7 g/dL — ABNORMAL LOW (ref 3.5–5.0)
Alkaline Phosphatase: 65 U/L (ref 38–126)
Anion gap: 6 (ref 5–15)
BUN: 25 mg/dL — ABNORMAL HIGH (ref 8–23)
CO2: 26 mmol/L (ref 22–32)
Calcium: 8.7 mg/dL — ABNORMAL LOW (ref 8.9–10.3)
Chloride: 101 mmol/L (ref 98–111)
Creatinine, Ser: 0.83 mg/dL (ref 0.61–1.24)
GFR, Estimated: >60 mL/min (ref >60)
Glucose, Bld: 111 mg/dL — ABNORMAL HIGH (ref 70–99)
Potassium: 3.8 mmol/L (ref 3.5–5.1)
Sodium: 133 mmol/L — ABNORMAL LOW (ref 135–145)
Total Bilirubin: 0.2 mg/dL — ABNORMAL LOW (ref 0.3–1.2)
Total Protein: 5.7 g/dL — ABNORMAL LOW (ref 6.5–8.1)

## 2021-11-17 NOTE — Progress Notes (Signed)
Physical Therapy Treatment Patient Details Name: Corey [name] MRN: [id number] DOB: 1946-06-30 Today's Date: 11/17/2021   History of Present Illness 76yo male who presented on 10/25 with L LE edema and generalized weakness. Also with recent hx of PNA. Admitted with L LE cellulitis. PMH NSCLC on chemo, Afib, CAD, chronic LBP with radiculopathy, HLD, HTN, prostate CA, CVA, ACDF, back surgery, L rotator cuff repair, TKA    PT Comments    Pt tolerated increased activity level today, he ambulated 20' + 15' with a 5 minutes seated rest break. SaO2 97% on 4L O2 at rest, 93% on 4L O2 with ambulation. Ambulation distance limited by 3/4 dyspnea and fatigue. Pt puts forth good effort.     Recommendations for follow up therapy are one component of a multi-disciplinary discharge planning process, led by the attending physician.  Recommendations may be updated based on patient status, additional functional criteria and insurance authorization.  Follow Up Recommendations  Acute inpatient rehab (3hours/day)     Assistance Recommended at Discharge Frequent or constant Supervision/Assistance  Patient can return home with the following A little help with walking and/or transfers;A little help with bathing/dressing/bathroom;Assist for transportation;Help with stairs or ramp for entrance;Assistance with cooking/housework   Equipment Recommendations  None recommended by PT    Recommendations for Other Services       Precautions / Restrictions Precautions Precautions: Fall Precaution Comments: O2 dep at baseline Restrictions Weight Bearing Restrictions: No     Mobility  Bed Mobility Overal bed mobility: Modified Independent       Supine to sit: HOB elevated, Modified independent (Device/Increase time)     General bed mobility comments: used rail    Transfers Overall transfer level: Needs assistance Equipment used: Rolling walker (2 wheels) Transfers: Sit to/from Stand Sit to Stand: From  elevated surface, Min guard           General transfer comment: min/guard from elevated bed and then from recliner using BUEs to push up from arm rests    Ambulation/Gait Ambulation/Gait assistance: Min guard Gait Distance (Feet): 20 Feet (20' + 15' with seated rest break) Assistive device: Rolling walker (2 wheels) Gait Pattern/deviations: Trunk flexed, Decreased stride length Gait velocity: decr     General Gait Details: SaO2 97% on 4L at rest, 93% on 4L O2 with walking. 20' + 15' with ~5 minute seated rest break, distance limited by dyspnea & generalized fatigue, VCs to increase step length and to step closer to [employer]    Modified Rankin (Stroke Patients Only)       Balance Overall balance assessment: Needs assistance   Sitting balance-Leahy Scale: Good     Standing balance support: Reliant on assistive device for balance Standing balance-Leahy Scale: Poor Standing balance comment: reliant on walker                            Cognition Arousal/Alertness: Awake/alert Behavior During Therapy: WFL for tasks assessed/performed Overall Cognitive Status: Within Functional Limits for tasks assessed                                          Exercises      General Comments        Pertinent Vitals/Pain Pain Assessment Pain Assessment: No/denies  pain Facial Expression: Relaxed, neutral Body Movements: Absence of movements Muscle Tension: Relaxed Compliance with ventilator (intubated pts.): N/A Vocalization (extubated pts.): Talking in normal tone or no sound CPOT Total: 0    Home Living                          Prior Function            PT Goals (current goals can now be found in the care plan section) Acute Rehab PT Goals Patient Stated Goal: to go back to inpatient rehab PT Goal Formulation: With patient Time For Goal Achievement: 11/30/21 Potential to Achieve Goals:  Good Progress towards PT goals: Progressing toward goals    Frequency    Min 3X/week      PT Plan Current plan remains appropriate    Co-evaluation              AM-PAC PT "6 Clicks" Mobility   Outcome Measure  Help needed turning from your back to your side while in a flat bed without using bedrails?: A Little Help needed moving from lying on your back to sitting on the side of a flat bed without using bedrails?: A Little Help needed moving to and from a bed to a chair (including a wheelchair)?: A Little Help needed standing up from a chair using your arms (e.g., wheelchair or bedside chair)?: A Little Help needed to walk in hospital room?: A Little Help needed climbing 3-5 steps with a railing? : A Lot 6 Click Score: 17    End of Session Equipment Utilized During Treatment: Oxygen Activity Tolerance: Patient tolerated treatment well;Patient limited by fatigue Patient left: with call bell/phone within reach;with family/visitor present;in chair Nurse Communication: Mobility status PT Visit Diagnosis: Muscle weakness (generalized) (M62.81);Difficulty in walking, not elsewhere classified (R26.2)     Time: 1093-2355 PT Time Calculation (min) (ACUTE ONLY): 25 min  Charges:  $Gait Training: 23-37 mins                    Blondell Reveal Kistler PT 11/17/2021  Acute Rehabilitation Services Pager [phone] Office [phone]

## 2021-11-17 NOTE — Progress Notes (Signed)
Progress Note   Patient: Corey [name] JJO:[id number] DOB: November 22, 1945 DOA: 11/03/2021     14 DOS: the patient was seen and examined on 11/17/2021   Brief hospital course: Corey [name] is a 76 y.o. male with PMH stage IV NSCLC with liver mets, PAF, CAD, chronic low back p ain, anemia, anxiety, osteoarthritis, HLD, HTN, GERD, OSA on CPAP, prostate cancer who presented with approximately 4 days of progressively worsening weakness and ongoing productive cough of yellow sputum with associated pleuritic chest pain, malaise, dyspnea.  He recently deceived chemotherapy within the past week as well.  Due to continued symptoms of feeling poorly, he presented for further evaluation. On evaluation in the ER, he underwent CXR which showed ongoing extensive irregular opacities involving the left lung not significantly changed from prior imaging.  Prior CT chest on 10/15/2021 showed a stable dense consolidation in the lingula and left lower lobe. Notable labs included WBC 2.8, platelet count 158.  Sodium 128.  BNP 187. There was concern for possible pneumonia and he was started on vancomycin and cefepime. He also developed A. fib with RVR and was started on a Cardizem drip.  Assessment and Plan: * Atrial fibrillation with RVR ([place])- (present on admission) - on Tikosyn, Toprol, and Xarelto at home - Patient noted to be in A. fib with RVR on admission - cardiology consulted for further assistance given complexity of med regimen and prolonged QTc - Given digoxin load on 2/7.  - Weaned of cardizem gtt, up to 240mg  daily -Now on PO amiodarone per Cardiology, would not continue long-term. Currently on 400mg  bid x 7 days total, then 200mg  daily x 7 more days -Per Cardiology, plan for DCCV if patient remains in afib as outpt after 3 weeks of uninterrupted AC -metoprolol cont at 100mg  bid per Cardiology -Xarelto continued -Cardiology has since signed off  Dysarthria - acutely noted on the evening of 2/6 with concern  for a stroke. 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% left ICA stenosis  - no obvious stroke on [facility]; does have chronic left parietal lobe infarct noted -Follow-up MRI brain noted to be unremarkable, now on Xarelto today  Stasis dermatitis - more prominent on LLE; no overt cellulitis signs as nontender as well - B/L LE duplex negative for DVT as well, performed 11/06/21 - likely caused by ongoing LE edema - some improvement in LE edema with lasix  Prolonged Q-T interval on ECG - tikosyn now officially discontinued per cardiology - continue monitoring electrolytes; goal K>4, Mg>2  Macrocytic anemia- (present on admission) - no overt bleeding - downtrend suspected in setting of chemo recently   Leukopenia- (present on admission) - likely multifactorial from underlying infection plus recent chemo  -resolved  CAP (community acquired pneumonia)- (present on admission) - ongoing LLL consolidation which may be acutely worse on admission; symptomatically he felt worse over the past ~1 week prior to admission - s/p vanc and cefepime  - flagyl completed 2/10 - cultures had remained neg  Non-small cell carcinoma of left lung, stage 4 ([place])- (present on admission) - follows outpatient with Dr. [name] -Palliative Care consulted and following. Pt has improved markedly thus far and pt is keen on improving further -Pt has not ruled out possibility of pursuing chemo moving forward -Plan for possible rehab at time of d/c. Therapy recs for CIR. -CIR was consulted  Hyponatremia- (present on admission) - appeared clinically volume depleted on admission - unfortunately edema was a big issue in LE; given hypoalbuminemia he is likely  3rd spacing. FeNa calculated <<1% on 11/06/21 consistent with intravascular volume depletion and worsening hyponatremia at that time - pt is s/p albumin - Na had improved with albumin and lasix - repeat BMP in AM  OSA on CPAP - continue nightly CPAP  GERD (gastroesophageal  reflux disease)- (present on admission) - continue PPI  Dyslipidemia- (present on admission) - Continue Lipitor  Benign essential HTN- (present on admission) - BP remains stable and controlled at this time - continue monitoring   Coronary artery disease- (present on admission) - Continue Lipitor, BB -On Xarelto for underlying A. fib  Sepsis (HCC)-resolved as of 11/08/2021, (present on admission) - neutropenia, tachycardia, tachypnea, lung source suspected - see pna treatment      Subjective: States feeling eager to cont with rehab  Physical Exam: Vitals:   11/17/21 0540 11/17/21 0809 11/17/21 0950 11/17/21 1308  BP: 111/73 107/71 110/65 125/66  Pulse: 88 89 73 64  Resp: 17 15 15 16   Temp: 98 F (36.7 C) 98 F (36.7 C) 98 F (36.7 C) 98 F (36.7 C)  TempSrc:  Oral Oral Oral  SpO2: 98% 98%  98%  Weight:      Height:       General exam: Awake, laying in bed, in nad Respiratory system: Normal respiratory effort, no wheezing Cardiovascular system: regular rate, s1, s2 Gastrointestinal system: Soft, nondistended, positive BS Central nervous system: CN2-12 grossly intact, strength intact Extremities: Perfused, no clubbing Skin: Normal skin turgor, no notable skin lesions seen Psychiatry: Mood normal // no visual hallucinations   Data Reviewed:  Labs reviewed  Family Communication: Pt in room, family not at bedside  Disposition: Status is: Inpatient Remains inpatient appropriate because: Severity of illness    Planned Discharge Destination: Rehab      Author: Marylu Lund, MD 11/17/2021 1:09 PM  For on call review [email or web address].

## 2021-11-17 NOTE — Plan of Care (Signed)
°  Problem: Education: Goal: Knowledge of disease or condition will improve Outcome: Progressing   Problem: Activity: Goal: Ability to tolerate increased activity will improve Outcome: Progressing   Problem: Education: Goal: Knowledge of General Education information will improve Description: Including pain rating scale, medication(s)/side effects and non-pharmacologic comfort measures Outcome: Progressing   Problem: Health Behavior/Discharge Planning: Goal: Ability to manage health-related needs will improve Outcome: Progressing

## 2021-11-17 NOTE — Progress Notes (Signed)
Inpatient Rehab Admissions Coordinator:   I do not have insurance auth or a bed for this Pt. Today. Pt.  Insurance requested peer to peer. I am working on setting that up.  Clemens Catholic, [place], [place] Admissions Coordinator  [phone] ([place]) [phone] (office)

## 2021-11-17 NOTE — Care Management Important Message (Signed)
Important Message  Patient Details IM Letter placed in Patients room. Name: Corey [name] MRN: [id number] Date of Birth: August 30, 1946   Medicare Important Message Given:  Yes     Kerin Salen 11/17/2021, 1:32 PM

## 2021-11-17 NOTE — PMR Pre-admission (Shared)
PMR Admission Coordinator Pre-Admission Assessment  Patient: Corey [name] is an 76 y.o., male MRN: [id number] DOB: 07/30/1946 Height: 6' 1"  (185.4 cm) Weight: 104.9 kg  Insurance Information HMO:     PPO: yes      PCP:      IPA:      80/20: yes     OTHER:  PRIMARY: [employer]      Policy#: [id number]      Subscriber: Pt CM Name: ***      Phone#: ***     Fax#: *** Pre-Cert#: [id number]       Employer: *** Benefits:  Phone #: ***     Name: Irene Shipper Date: 10/05/2019- still active Deductible: does not have OOP Max: $3,300 ($360.60 met) CIR: $125/day co-pay with a max co-pay of $1,250/admission (10 days) SNF: 100% coverage Outpatient:  $20/visit co-pay Home Health:  100% coverage DME: 80% coverage; 20% co-insurance  SECONDARY:       Policy#:      Phone#:    Development worker, community:       Phone#:   The Actuary for patients in Inpatient Rehabilitation Facilities with attached Privacy Act [place] Records was provided and verbally reviewed with: Patient and Family  Emergency Contact Information Contact Information     Name Relation Home Work [employer] [address] [place] [phone] [place] Son [phone]  [phone]   [place] Daughter [phone]  [phone]       Current Medical History  Patient Admitting Diagnosis: Debility, Stage 4 Lung CA, PNA History of Present Illness: Corey [name] is a 75 y.o. 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  He last received chemotherapy 6 days PTA.  Initial vital signs were temperature 98.8 F, pulse 9019, respiration 18, BP 130/95 mmHg O2 sat 98% on 4 L nasal cannula.  The patient received azithromycin 500 mg IVPB, cefepime 2 g IVPB, LR 1000 mL bolus, vancomycin 2000 mg IV, diltiazem 10 mg IVP x1 dose and was started on diltiazem infusion.  I added another 1000 mL of LR bolus, magnesium sulfate 2 g IVPB and metoprolol 5 mg IVPB.Initial CBC showed white count 2.8, hemoglobin 9.5 g/dL with an MCV of 102.2 fL and platelets 158.  PT 19.6, INR 1.7 and PTT 47.  CMP showed sodium 128 and chloride 97 mmol/L.  Albumin was 2.7 g/dL.  The rest of the CMP measurements were unremarkable once calcium is corrected to albumin level.  Lactic acid was normal twice.  Procalcitonin 0.33 ng/L.  BNP 888 pg/mL.  Urinalysis was unremarkable. Pt. Seen by PT/OT who recommended CIR to assist return to PLOF.    Complete NIHSS TOTAL: 8  Patient's medical record from [facility] has been reviewed by the rehabilitation admission coordinator and physician.  Past Medical History  Past Medical History:  Diagnosis Date   Anemia    Anxiety    Arthritis    "back, right knee, hands, ankles, neck" (05/31/2016)   Carotid artery disease (HCC)    Chronic lower back pain  Coronary atherosclerosis of native coronary artery    a. BMS to [facility] 2004 and 2007, otherwise mild nonobstructive disease. EF normal.   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 Discovered after stroke.   Pneumonia 01/1996   PONV (postoperative nausea and vomiting)    Prostate CA (HCC)    Recurrent upper respiratory infection (URI)    Stroke ([place]) 07/2012   no deficits   Symptomatic anemia    Visit for monitoring Tikosyn therapy 09/18/2019    Has the patient had major surgery during 100 days prior to admission? No  Family History   family history includes Blindness in his son; Breast cancer in his paternal grandmother; Heart attack in his father and paternal grandfather; Heart disease in his father; Hypertension in his mother; Lung cancer in his paternal aunt; Parkinson's disease in his brother; Prostate cancer (age of onset: 71) in his brother.  Current Medications  Current Facility-Administered Medications:    0.9 %  sodium chloride infusion, , Intravenous, PRN, Donne Hazel, MD, Stopped at 11/13/21 1400   acetaminophen (TYLENOL) tablet 650 mg, 650 mg, Oral, Q6H PRN, 650 mg at 11/10/21 1654 **OR** acetaminophen (TYLENOL) suppository 650 mg, 650 mg, Rectal, Q6H PRN, Donne Hazel, MD   acidophilus (RISAQUAD) capsule 1 capsule, 1 capsule, Oral, Daily, Donne Hazel, MD, 1 capsule at 11/16/21 1002   ALPRAZolam (XANAX) tablet 0.5 mg, 0.5 mg, Oral, QHS PRN, Donne Hazel, MD, 0.5 mg at 11/16/21 2139   alum & mag hydroxide-simeth (MAALOX/MYLANTA) 200-200-20 MG/5ML suspension 30 mL, 30 mL, Oral, Q6H PRN, Donne Hazel, MD, 30 mL at 11/05/21 1959   [START ON 11/20/2021] amiodarone (PACERONE) tablet 200 mg, 200 mg, Oral, Daily, Donne Hazel, MD   amiodarone (PACERONE) tablet 400 mg, 400 mg, Oral, BID, Donne Hazel, MD, 400 mg at 11/16/21 2139   arformoterol (BROVANA) nebulizer solution 15 mcg, 15 mcg, Nebulization, BID, 15 mcg at 11/17/21 0809 **AND** [DISCONTINUED] umeclidinium bromide (INCRUSE ELLIPTA) 62.5 MCG/ACT 1 puff, 1 puff, Inhalation, Daily, Reubin Milan, MD, 1 puff at 11/10/21 0831    atorvastatin (LIPITOR) tablet 40 mg, 40 mg, Oral, QHS, Donne Hazel, MD, 40 mg at 11/16/21 2139   benzonatate (TESSALON) capsule 200 mg, 200 mg, Oral, TID PRN, Donne Hazel, MD, 200 mg at 11/15/21 2132   budesonide (PULMICORT) nebulizer solution 0.5 mg, 0.5 mg, Nebulization, BID, Donne Hazel, MD, 0.5 mg at 11/17/21 4403   carbidopa-levodopa (SINEMET IR) 25-100 MG per tablet immediate release 1 tablet, 1 tablet, Oral, 4 times per day, Donne Hazel, MD, 1 tablet at 11/16/21 2138   Carbinoxamine Maleate TABS 6 mg, 6 mg, Oral, BID, Donne Hazel, MD, 6 mg at 11/16/21 2151   Chlorhexidine Gluconate Cloth 2 % PADS 6 each, 6 each, Topical, Daily, Donne Hazel, MD, 6 each at 11/16/21 1005   dextromethorphan (DELSYM) 30 MG/5ML liquid 60 mg, 60 mg, Oral, BID PRN, Marylu Lund  K, MD, 60 mg at 11/16/21 2151   diltiazem (CARDIZEM CD) 24 hr capsule 240 mg, 240 mg, Oral, Daily, Donne Hazel, MD, 240 mg at 11/16/21 1002   feeding supplement (ENSURE ENLIVE / ENSURE PLUS) liquid 237 mL, 237 mL, Oral, TID BM, Donne Hazel, MD, 237 mL at 11/16/21 1005   fenofibrate tablet 160 mg, 160 mg, Oral, Q1400, Donne Hazel, MD, 160 mg at 11/16/21 1358   fluticasone (FLONASE) 50 MCG/ACT nasal spray 2 spray, 2 spray, Each Nare, Daily, Donne Hazel, MD, 2 spray at 05/17/87 7195   folic acid (FOLVITE) tablet 1 mg, 1 mg, Oral, Daily, Donne Hazel, MD, 1 mg at 11/16/21 1005   guaiFENesin (MUCINEX) 12 hr tablet 1,200 mg, 1,200 mg, Oral, BID, Donne Hazel, MD, 1,200 mg at 11/16/21 2139   ipratropium (ATROVENT) 0.03 % nasal spray 2 spray, 2 spray, Each Nare, q AM, Donne Hazel, MD, 2 spray at 11/15/21 0811   lactulose (CHRONULAC) 10 GM/15ML solution 10 g, 10 g, Oral, BID, Donne Hazel, MD, 10 g at 11/16/21 2138   levalbuterol (XOPENEX) nebulizer solution 0.63 mg, 0.63 mg, Nebulization, BID, Donne Hazel, MD, 0.63 mg at 11/17/21 9747   levalbuterol (XOPENEX) nebulizer solution 0.63 mg, 0.63 mg,  Nebulization, Q2H PRN, Donne Hazel, MD   lip balm (CARMEX) ointment, , Topical, PRN, Donne Hazel, MD, 75 application at 18/55/01 2348   MEDLINE mouth rinse, 15 mL, Mouth Rinse, BID, Donne Hazel, MD, 15 mL at 11/16/21 2140   metoCLOPramide (REGLAN) injection 10 mg, 10 mg, Intravenous, Q6H PRN, Donne Hazel, MD, 10 mg at 11/09/21 5868   metoprolol tartrate (LOPRESSOR) tablet 100 mg, 100 mg, Oral, BID, Donne Hazel, MD, 100 mg at 11/16/21 2139   multivitamin with minerals tablet 1 tablet, 1 tablet, Oral, Daily, Donne Hazel, MD, 1 tablet at 11/16/21 1001   nitroGLYCERIN (NITROSTAT) SL tablet 0.4 mg, 0.4 mg, Sublingual, Q5 min PRN, Donne Hazel, MD   pantoprazole (PROTONIX) EC tablet 40 mg, 40 mg, Oral, Daily, Donne Hazel, MD, 40 mg at 11/16/21 1001   polyethylene glycol (MIRALAX / GLYCOLAX) packet 17 g, 17 g, Oral, BID, Donne Hazel, MD, 17 g at 11/16/21 2138   [COMPLETED] predniSONE (DELTASONE) tablet 40 mg, 40 mg, Oral, Q breakfast, 40 mg at 11/16/21 0815 **FOLLOWED BY** predniSONE (DELTASONE) tablet 30 mg, 30 mg, Oral, Q breakfast **FOLLOWED BY** [START ON 11/24/2021] predniSONE (DELTASONE) tablet 20 mg, 20 mg, Oral, Q breakfast **FOLLOWED BY** [START ON 12/01/2021] predniSONE (DELTASONE) tablet 10 mg, 10 mg, Oral, Q breakfast **FOLLOWED BY** [START ON 12/08/2021] predniSONE (DELTASONE) tablet 5 mg, 5 mg, Oral, Q breakfast, Donne Hazel, MD   revefenacin (YUPELRI) nebulizer solution 175 mcg, 175 mcg, Nebulization, Daily, Donne Hazel, MD, 175 mcg at 11/17/21 2574   rivaroxaban (XARELTO) tablet 20 mg, 20 mg, Oral, QPM, Donne Hazel, MD, 20 mg at 11/16/21 1741   senna-docusate (Senokot-S) tablet 1 tablet, 1 tablet, Oral, BID, Donne Hazel, MD, 1 tablet at 11/16/21 2139   sorbitol 70 % solution 30 mL, 30 mL, Oral, Daily, Donne Hazel, MD, 30 mL at 11/15/21 1012   tamsulosin (FLOMAX) capsule 0.4 mg, 0.4 mg, Oral, QPC supper, Donne Hazel, MD, 0.4 mg at 11/16/21  1742   topiramate (TOPAMAX) tablet 100 mg, 100 mg, Oral, BID, Donne Hazel, MD, 100 mg at 11/16/21 2139  Patients Current Diet:  Diet Order  Diet regular Room service appropriate? Yes; Fluid consistency: Thin  Diet effective now                   Precautions / Restrictions Precautions Precautions: Fall Precaution Comments: O2 dep at baseline Restrictions Weight Bearing Restrictions: No   Has the patient had 2 or more falls or a fall with injury in the past year? Yes  Prior Activity Level Limited Community (1-2x/wk): Pt. went out for appointments  Prior Functional Level Self Care: Did the patient need help bathing, dressing, using the toilet or eating? Needed some help  Indoor Mobility: Did the patient need assistance with walking from room to room (with or without device)? Independent  Stairs: Did the patient need assistance with internal or external stairs (with or without device)? Independent  Functional Cognition: Did the patient need help planning regular tasks such as shopping or remembering to take medications? Needed some help  Patient Information Are you of Hispanic, Latino/a,or Spanish origin?: A. No, not of Hispanic, Latino/a, or Spanish origin What is your race?: A. White Do you need or want an interpreter to communicate with a doctor or health care staff?: 0. No  Patient's Response To:  Health Literacy and Transportation Is the patient able to respond to health literacy and transportation needs?: Yes Health Literacy - How often do you need to have someone help you when you read instructions, pamphlets, or other written material from your doctor or pharmacy?: Sometimes In the past 12 months, has lack of transportation kept you from medical appointments or from getting medications?: No In the past 12 months, has lack of transportation kept you from meetings, work, or from getting things needed for daily living?: No  Development worker, international aid /  [place] Devices/Equipment: None Home Equipment: Rollator (4 wheels), [employer] - single point, Shower seat, Conservation officer, nature (2 wheels), Hand held shower head, Transport chair, Adaptive equipment  Prior Device Use: Indicate devices/aids used by the patient prior to current illness, exacerbation or injury? Motorized wheelchair or scooter and Barista  Overall Cognitive Status: Within Functional Limits for tasks assessed Orientation Level: Oriented X4    Extremity Assessment (includes Sensation/Coordination)  Upper Extremity Assessment: Defer to OT evaluation RUE Deficits / Details: WFL ROM, 4/5 shoulder strength otherwise 5/5 throughout, UE tremulous RUE Sensation: WNL RUE Coordination: WNL LUE Deficits / Details: WFL ROM, 4/5 shoulder strength otherwise 5/5 throughout, UE tremulous LUE Sensation: WNL LUE Coordination: WNL  Lower Extremity Assessment: Generalized weakness    ADLs  Overall ADL's : Needs assistance/impaired Eating/Feeding: Independent Grooming: Standing, Sitting, Wash/dry hands, Oral care, Set up Grooming Details (indicate cue type and reason): Pt stood at sink initially for hand hygiene but became too fatigued after toileting on BSC and lowered to arm chair anterior to sink. Pt then completed hand washing and oral care with setup in chair position. Upper Body Bathing: Set up, Sitting Lower Body Bathing: Minimal assistance, Sit to/from stand Upper Body Dressing : Set up, Sitting Lower Body Dressing: Moderate assistance, Sit to/from stand Toilet Transfer: Min guard, Rolling walker (2 wheels), BSC/3in1, Cueing for sequencing Toileting- Clothing Manipulation and Hygiene: Total assistance, Sit to/from stand Toileting - Clothing Manipulation Details (indicate cue type and reason): Pt very tremulous after toileting and requested total assist for posterior peri hygiene while pt help RW with BUEs in standing.  Performed x 2 as pt  found to be slightly soiled when rising from arm chair after rest break. Functional mobility  during ADLs: Minimal assistance, Rolling walker (2 wheels)    Mobility  Overal bed mobility: Needs Assistance Bed Mobility: Sit to Supine Supine to sit: Supervision, HOB elevated Sit to supine: Supervision General bed mobility comments: Supv for safety, lines. Increased time.    Transfers  Overall transfer level: Needs assistance Equipment used: Rolling walker (2 wheels) Transfers: Sit to/from Stand, Bed to chair/wheelchair/BSC Sit to Stand: Min assist Bed to/from chair/wheelchair/BSC transfer type:: Step pivot Step pivot transfers: Min assist General transfer comment: Assist to rise from recliner. Cues for safety, technique, hand/LE placement. Step pivot, recliner to bed, using RW.    Ambulation / Gait / Stairs / Water engineer (Feet): 3 Feet Assistive device: Rolling walker (2 wheels) Gait Pattern/deviations: Trunk flexed General Gait Details: Cues for safety. Assist to steady. Remained on [place] O2. Dyspnea 2/4. Will plan to attempt hallway ambulation next sesson.    Posture / Balance Balance Overall balance assessment: Needs assistance Sitting-balance support: No upper extremity supported, Feet supported Sitting balance-Leahy Scale: Good Standing balance support: Bilateral upper extremity supported, During functional activity, Reliant on assistive device for balance Standing balance-Leahy Scale: Poor Standing balance comment: reliant on walker and tremulous in standing.    Special needs/care consideration Skin *** and Special service needs ***   Previous Home Environment (from acute therapy documentation) Living Arrangements: Spouse/significant other  Lives With: Spouse Available Help at Discharge: Family, Available PRN/intermittently, Friend(s), Neighbor Type of Home: House Home Layout: Two level, Bed/bath upstairs, 1/2 bath on main  level Alternate Level Stairs-Rails: Right, Left Alternate Level Stairs-Number of Steps: flight Home Access: Stairs to enter Entrance Stairs-Rails: None Entrance Stairs-Number of Steps: 2 + 2 Bathroom Shower/Tub: Chiropodist: Handicapped height Bathroom Accessibility: No Home Care Services: No Additional Comments: Still has bedroom downstairs available.  Discharge Living Setting Plans for Discharge Living Setting: Patient's home Type of Home at Discharge: House Discharge Home Layout: Two level, Bed/bath upstairs Alternate Level Stairs-Rails: Right, Left Alternate Level Stairs-Number of Steps: flight Discharge Home Access: Stairs to enter Entrance Stairs-Rails: Right, Left Entrance Stairs-Number of Steps: 4 Discharge Bathroom Shower/Tub: Tub/shower unit Discharge Bathroom Toilet: Handicapped height Discharge Bathroom Accessibility: Yes How Accessible: Accessible via walker Does the patient have any problems obtaining your medications?: No  Social/Family/Support Systems Patient Roles: Spouse Contact Information: [phone] Anticipated Caregiver: Jentzen Minasyan Anticipated Caregiver's Contact Information: [phone] Ability/Limitations of Caregiver: Can provide Min a Caregiver Availability: 24/7 Discharge Plan Discussed with Primary Caregiver: Yes Is Caregiver In Agreement with Plan?: Yes Does Caregiver/Family have Issues with Lodging/Transportation while Pt is in Rehab?: No  Goals Patient/Family Goal for Rehab: PT/OT Mod I Expected length of stay: 10-12 days Pt/Family Agrees to Admission and willing to participate: Yes Program Orientation Provided & Reviewed with Pt/Caregiver Including Roles  & Responsibilities: Yes  Decrease burden of Care through IP rehab admission: Specialzed equipment needs, Decrease number of caregivers, Bowel and bladder program, and Patient/family education  Possible need for SNF placement upon discharge: not anticipated    Patient Condition: I have reviewed medical records from [facility], spoken with CM, and patient and spouse. I met with patient at the bedside for inpatient rehabilitation assessment.  Patient will benefit from ongoing PT and OT, can actively participate in 3 hours of therapy a day 5 days of the week, and can make measurable gains during the admission.  Patient will also benefit from the coordinated team approach during an Inpatient Acute Rehabilitation admission.  The patient will receive  intensive therapy as well as Rehabilitation physician, nursing, social worker, and care management interventions.  Due to safety, skin/wound care, disease management, medication administration, pain management, and patient education the patient requires 24 hour a day rehabilitation nursing.  The patient is currently *** with mobility and basic ADLs.  Discharge setting and therapy post discharge at home with home health is anticipated.  Patient has agreed to participate in the Acute Inpatient Rehabilitation Program and will admit {Time; today/tomorrow:10263}.  Preadmission Screen Completed By:  Genella Mech, 11/17/2021 8:30 AM ______________________________________________________________________   Discussed status with Dr. Marland Kitchen on *** at *** and received approval for admission today.  Admission Coordinator:  Genella Mech, CCC-SLP, time Marland KitchenSudie Grumbling ***   Assessment/Plan: Diagnosis: Does the need for close, 24 hr/day Medical supervision in concert with the patient's rehab needs make it unreasonable for this patient to be served in a less intensive setting? {yes_no_potentially:3041433} Co-Morbidities requiring supervision/potential complications: *** Due to {due TJ:9597471}, does the patient require 24 hr/day rehab nursing? {yes_no_potentially:3041433} Does the patient require coordinated care of a physician, rehab nurse, PT, OT, and SLP to address physical and functional deficits in the context of the above  medical diagnosis(es)? {yes_no_potentially:3041433} Addressing deficits in the following areas: {deficits:3041436} Can the patient actively participate in an intensive therapy program of at least 3 hrs of therapy 5 days a week? {yes_no_potentially:3041433} The potential for patient to make measurable gains while on inpatient rehab is {potential:3041437} Anticipated functional outcomes upon discharge from inpatient rehab: {functional outcomes:304600100} PT, {functional outcomes:304600100} OT, {functional outcomes:304600100} SLP Estimated rehab length of stay to reach the above functional goals is: *** Anticipated discharge destination: {anticipated dc setting:21604} 10. Overall Rehab/Functional Prognosis: {potential:3041437}   MD Signature: ***

## 2021-11-18 ENCOUNTER — Inpatient Hospital Stay: Payer: Medicare PPO

## 2021-11-18 ENCOUNTER — Inpatient Hospital Stay: Payer: Medicare PPO | Admitting: Internal Medicine

## 2021-11-18 ENCOUNTER — Other Ambulatory Visit: Payer: Medicare PPO

## 2021-11-18 LAB — COMPREHENSIVE METABOLIC PANEL
ALT: 12 U/L (ref 0–44)
AST: 21 U/L (ref 15–41)
Albumin: 2.8 g/dL — ABNORMAL LOW (ref 3.5–5.0)
Alkaline Phosphatase: 63 U/L (ref 38–126)
Anion gap: 8 (ref 5–15)
BUN: 25 mg/dL — ABNORMAL HIGH (ref 8–23)
CO2: 27 mmol/L (ref 22–32)
Calcium: 8.7 mg/dL — ABNORMAL LOW (ref 8.9–10.3)
Chloride: 100 mmol/L (ref 98–111)
Creatinine, Ser: 0.71 mg/dL (ref 0.61–1.24)
GFR, Estimated: >60 mL/min (ref >60)
Glucose, Bld: 84 mg/dL (ref 70–99)
Potassium: 4 mmol/L (ref 3.5–5.1)
Sodium: 135 mmol/L (ref 135–145)
Total Bilirubin: 0.2 mg/dL — ABNORMAL LOW (ref 0.3–1.2)
Total Protein: 5.9 g/dL — ABNORMAL LOW (ref 6.5–8.1)

## 2021-11-18 LAB — CBC
HCT: 27.9 % — ABNORMAL LOW (ref 39.0–52.0)
Hemoglobin: 8.8 g/dL — ABNORMAL LOW (ref 13.0–17.0)
MCH: 34.1 pg — ABNORMAL HIGH (ref 26.0–34.0)
MCHC: 31.5 g/dL (ref 30.0–36.0)
MCV: 108.1 fL — ABNORMAL HIGH (ref 80.0–100.0)
Platelets: 232 10*3/uL (ref 150–400)
RBC: 2.58 MIL/uL — ABNORMAL LOW (ref 4.22–5.81)
RDW: 16.6 % — ABNORMAL HIGH (ref 11.5–15.5)
WBC: 9.7 10*3/uL (ref 4.0–10.5)
nRBC: 0.4 % — ABNORMAL HIGH (ref 0.0–0.2)

## 2021-11-18 NOTE — Progress Notes (Signed)
PROGRESS NOTE  WETZEL MEESTER  MWU:[id number] DOB: 04-04-1946 DOA: 11/03/2021 PCP: [name], MD   Brief Narrative: Corey [name] is a 76 y.o. male with PMH stage IV NSCLC with liver mets, PAF, CAD, chronic low back p ain, anemia, anxiety, osteoarthritis, HLD, HTN, GERD, OSA on CPAP, prostate cancer who presented with approximately 4 days of progressively worsening weakness and ongoing productive cough of yellow sputum with associated pleuritic chest pain, malaise, dyspnea.  He recently deceived chemotherapy within the past week as well.  Due to continued symptoms of feeling poorly, he presented for further evaluation. On evaluation in the ER, he underwent CXR which showed ongoing extensive irregular opacities involving the left lung not significantly changed from prior imaging.  Prior CT chest on 10/15/2021 showed a stable dense consolidation in the lingula and left lower lobe. Notable labs included WBC 2.8, platelet count 158.  Sodium 128.  BNP 187. There was concern for possible pneumonia and he was started on vancomycin and cefepime.currently respiratory status is stable.  He also developed A. fib with RVR and was started on a Cardizem drip.  Currently his rate is well controlled on oral medicines.  Cardiology following, signed off with plan for outpatient DCCV if patient remains in A-fib.  PT has recommended inpatient rehab on discharge.  Hemodynamically stable for discharge whenever possible.   Assessment & Plan:  Principal Problem:   Atrial fibrillation with RVR (HCC) Active Problems:   Coronary artery disease   Benign essential HTN   Dyslipidemia   GERD (gastroesophageal reflux disease)   OSA on CPAP   Hyponatremia   Non-small cell carcinoma of left lung, stage 4 (HCC)   CAP (community acquired pneumonia)   COPD with acute exacerbation (HCC)   Physical deconditioning   Leukopenia   Macrocytic anemia   Prolonged Q-T interval on ECG   Stasis dermatitis   Dysarthria   Pressure  injury of skin   Assessment and Plan: * Atrial fibrillation with RVR ([place])- (present on admission) - on Tikosyn, Toprol, and Xarelto at home - Patient noted to be in A. fib with RVR on admission - cardiology consulted for further assistance given complexity of med regimen and prolonged QTc - Given digoxin load on 2/7.  - Weaned of cardizem gtt, up to 240mg  daily -Now on PO amiodarone per Cardiology, would not continue long-term. Currently on 400mg  bid x 7 days total, then 200mg  daily x 7 more days -Per Cardiology, plan for DCCV if patient remains in afib as outpt after 3 weeks of uninterrupted AC -metoprolol cont at 100mg  bid per Cardiology -Xarelto continued -Cardiology has since signed off  Dysarthria - acutely noted on the evening of 2/6 with concern for a stroke. 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% left ICA stenosis  - no obvious stroke on [facility]; does have chronic left parietal lobe infarct noted -Follow-up MRI brain noted to be unremarkable  Stasis dermatitis - more prominent on LLE; no overt cellulitis signs as nontender as well - B/L LE duplex negative for DVT as well, performed 11/06/21 - likely caused by ongoing LE edema - some improvement in LE edema with lasix  Prolonged Q-T interval on ECG - tikosyn now officially discontinued per cardiology - continue monitoring electrolytes; goal K>4, Mg>2  Macrocytic anemia- (present on admission) - no overt bleeding - downtrend suspected in setting of chemo recently   Leukopenia- (present on admission) - likely multifactorial from underlying infection plus recent chemo  -resolved  Physical deconditioning Plan for possible  rehab at time of d/c. Therapy recs for CIR. -CIR was consulted   COPD with acute exacerbation ([place])- (present on admission) Currently stable.  Continue bronchodilators as needed.  Continue Yupelri.  Started on slow tapering course.  CAP (community acquired pneumonia)- (present on admission) - ongoing LLL  consolidation which may be acutely worse on admission; symptomatically he felt worse over the past ~1 week prior to admission - Completed abx course - cultures had remained neg -Remains on 2 to 3 L of oxygen per minute  Non-small cell carcinoma of left lung, stage 4 ([place])- (present on admission) - follows outpatient with Dr. [name] -Palliative Care consulted and following. Pt has improved markedly thus far and pt is keen on improving further -Pt has not ruled out possibility of pursuing chemo moving forward   Hyponatremia- (present on admission) - appeared clinically volume depleted on admission - unfortunately edema was a big issue in LE; given hypoalbuminemia he is likely 3rd spacing. FeNa calculated <<1% on 11/06/21 consistent with intravascular volume depletion and worsening hyponatremia at that time - pt is s/p albumin - Na had improved with albumin and lasix -Sodium level is stable now  OSA on CPAP - continue nightly CPAP  GERD (gastroesophageal reflux disease)- (present on admission) - continue PPI  Dyslipidemia- (present on admission) - Continue Lipitor  Benign essential HTN- (present on admission) - BP remains stable and controlled at this time - continue monitoring   Coronary artery disease- (present on admission) - Continue Lipitor, BB -No anginal symptoms  Sepsis (HCC)-resolved as of 11/08/2021, (present on admission) - neutropenia, tachycardia, tachypnea, lung source suspected - see pna treatment          Nutrition Problem: Inadequate oral intake Etiology: cancer and cancer related treatments, decreased appetite (left femoral neck non-small cell stage IV carcinoma)    DVT prophylaxis:Place TED hose Start: 11/10/21 1332 rivaroxaban (XARELTO) tablet 20 mg     Code Status: DNR  Family Communication: None at bedside  Patient status:inpatient  Patient is from :Home  Anticipated discharge to:CIR  Estimated DC date:Whenever bed is  available   Consultants: None  Procedures:None  Antimicrobials:  Anti-infectives (From admission, onward)    Start     Dose/Rate Route Frequency Ordered Stop   11/10/21 2000  metroNIDAZOLE (FLAGYL) IVPB 500 mg        500 mg 100 mL/hr over 60 Minutes Intravenous Every 12 hours 11/10/21 1324 11/13/21 2142   11/09/21 2000  metroNIDAZOLE (FLAGYL) IVPB 500 mg  Status:  Discontinued        500 mg 100 mL/hr over 60 Minutes Intravenous Every 12 hours 11/09/21 1829 11/10/21 1324   11/04/21 1400  vancomycin (VANCOREADY) IVPB 1750 mg/350 mL  Status:  Discontinued        1,750 mg 175 mL/hr over 120 Minutes Intravenous Every 24 hours 11/03/21 1533 11/04/21 1351   11/03/21 2200  ceFEPIme (MAXIPIME) 2 g in sodium chloride 0.9 % 100 mL IVPB        2 g 200 mL/hr over 30 Minutes Intravenous Every 8 hours 11/03/21 1525 11/10/21 1438   11/03/21 1230  vancomycin (VANCOREADY) IVPB 2000 mg/400 mL        2,000 mg 200 mL/hr over 120 Minutes Intravenous  Once 11/03/21 1217 11/03/21 1518   11/03/21 1215  vancomycin (VANCOCIN) IVPB 1000 mg/200 mL premix  Status:  Discontinued        1,000 mg 200 mL/hr over 60 Minutes Intravenous  Once 11/03/21 1211 11/03/21 1217  11/03/21 1215  ceFEPIme (MAXIPIME) 2 g in sodium chloride 0.9 % 100 mL IVPB        2 g 200 mL/hr over 30 Minutes Intravenous  Once 11/03/21 1211 11/03/21 1311   11/03/21 1215  azithromycin (ZITHROMAX) 500 mg in sodium chloride 0.9 % 250 mL IVPB        500 mg 250 mL/hr over 60 Minutes Intravenous  Once 11/03/21 1211 11/03/21 1423       Subjective: Patient seen and examined at the bedside this morning.  Hemodynamically stable today.  On 3 L of oxygen per minute.  Denies any worsening shortness of breath or cough.  Eating his breakfast.  In high self-esteem and anticipating discharge to inpatient rehab.  Objective: Vitals:   11/17/21 2231 11/18/21 0600 11/18/21 0740 11/18/21 0741  BP:  113/81    Pulse:  87    Resp: (!) 22 18    Temp:  97.6  F (36.4 C)    TempSrc:  Oral    SpO2:  100% 99% 98%  Weight:      Height:        Intake/Output Summary (Last 24 hours) at 11/18/2021 1000 Last data filed at 11/18/2021 0500 Gross per 24 hour  Intake 540 ml  Output 3200 ml  Net -2660 ml   Filed Weights   11/15/21 1842  Weight: 104.9 kg    Examination:  General exam: Overall comfortable, chronically ill looking, obese HEENT: PERRL Respiratory system: Coarse crackles on the left side, no wheezing Cardiovascular system: S1 & S2 heard, RRR.  Chemo port on right  chest Gastrointestinal system: Abdomen is nondistended, soft and nontender. Central nervous system: Alert and oriented Extremities: No edema, no clubbing ,no cyanosis Skin: No rashes, no ulcers,no icterus     Data Reviewed: I have personally reviewed following labs and imaging studies  CBC: Recent Labs  Lab 11/12/21 0400 11/13/21 0310 11/14/21 0505 11/15/21 0501 11/18/21 0329  WBC 7.6 8.5 8.1 7.9 9.7  NEUTROABS 6.1 6.3 5.8 5.8  --   HGB 8.4* 8.7* 9.0* 9.0* 8.8*  HCT 26.1* 26.9* 28.1* 29.3* 27.9*  MCV 104.0* 104.7* 105.6* 107.7* 108.1*  PLT 127* 150 185 195 956   Basic Metabolic Panel: Recent Labs  Lab 11/12/21 0400 11/13/21 0310 11/14/21 0505 11/15/21 0501 11/16/21 0456 11/17/21 0359 11/18/21 0329  NA 133* 132* 133* 133* 133* 133* 135  K 4.1 4.3 4.0 3.8 4.2 3.8 4.0  CL 97* 97* 100 99 100 101 100  CO2 30 28 28 28 27 26 27   GLUCOSE 131* 114* 100* 94 109* 111* 84  BUN 11 16 19 20 22  25* 25*  CREATININE 0.74 0.82 0.79 0.66 0.77 0.83 0.71  CALCIUM 8.7* 8.8* 8.8* 8.8* 8.6* 8.7* 8.7*  MG 1.9 1.9 1.9  --   --   --   --      No results found for this or any previous visit (from the past 240 hour(s)).   Radiology Studies: No results found.  Scheduled Meds:  acidophilus  1 capsule Oral Daily   [START ON 11/20/2021] amiodarone  200 mg Oral Daily   amiodarone  400 mg Oral BID   arformoterol  15 mcg Nebulization BID   atorvastatin  40 mg Oral QHS    budesonide  0.5 mg Nebulization BID   carbidopa-levodopa  1 tablet Oral 4 times per day   Carbinoxamine Maleate  6 mg Oral BID   Chlorhexidine Gluconate Cloth  6 each Topical Daily  diltiazem  240 mg Oral Daily   feeding supplement  237 mL Oral TID BM   fenofibrate  160 mg Oral Q1400   fluticasone  2 spray Each Nare Daily   folic acid  1 mg Oral Daily   guaiFENesin  1,200 mg Oral BID   ipratropium  2 spray Each Nare q AM   lactulose  10 g Oral BID   levalbuterol  0.63 mg Nebulization BID   mouth rinse  15 mL Mouth Rinse BID   metoprolol tartrate  100 mg Oral BID   multivitamin with minerals  1 tablet Oral Daily   pantoprazole  40 mg Oral Daily   polyethylene glycol  17 g Oral BID   predniSONE  30 mg Oral Q breakfast   Followed by   Derrill Memo ON 11/24/2021] predniSONE  20 mg Oral Q breakfast   Followed by   Derrill Memo ON 12/01/2021] predniSONE  10 mg Oral Q breakfast   Followed by   Derrill Memo ON 12/08/2021] predniSONE  5 mg Oral Q breakfast   revefenacin  175 mcg Nebulization Daily   rivaroxaban  20 mg Oral QPM   senna-docusate  1 tablet Oral BID   sorbitol  30 mL Oral Daily   tamsulosin  0.4 mg Oral QPC supper   topiramate  100 mg Oral BID   Continuous Infusions:  sodium chloride Stopped (11/13/21 1400)     LOS: 15 days   Shelly Coss, MD [facility] P2/15/2023, 10:00 AM

## 2021-11-18 NOTE — Progress Notes (Signed)
Inpatient Rehab Admissions Coordinator:   I do not have a a CIR bed or insurance auth for this pt. Today. I will follow for potential admit pending insurance auth. If insurance denies, would consider SNF. Pt. Is amenable to SNF if denied.    Clemens Catholic, [place], [place] Admissions Coordinator  [phone] ([place]) [phone] (office)

## 2021-11-18 NOTE — NC FL2 (Signed)
[place] LEVEL OF CARE SCREENING TOOL     IDENTIFICATION  Patient Name: Corey [name] Birthdate: 08/21/1946 Sex: male Admission Date (Current Location): 11/03/2021  [facility] [place] Number:  Herbalist and Address:  [facility],  [place], [place]      Provider Number: [id number]  Attending Physician Name and Address:  Shelly Coss, MD  Relative Name and Phone Number:  Rushton Early spouse [phone]    Current Level of Care: Hospital Recommended Level of Care: [place] Prior Approval Number:    Date Approved/Denied:   PASRR Number: [id number]  Discharge Plan: SNF    Current Diagnoses: Patient Active Problem List   Diagnosis Date Noted   Pressure injury of skin 11/16/2021   Dysarthria 11/10/2021   Stasis dermatitis 11/06/2021   Prolonged Q-T interval on ECG 11/04/2021   Atrial fibrillation with RVR ([place]) 11/03/2021   Leukopenia 11/03/2021   Macrocytic anemia 11/03/2021   Streptococcal sore throat    Acute on chronic anemia    Supplemental oxygen dependent    Notalgia    Adjustment disorder with mixed anxiety and depressed mood    Physical deconditioning 08/12/2021   Acute respiratory failure with hypoxia ([place]) 08/01/2021   Cellulitis of left leg 07/30/2021   Hypokalemia 07/30/2021   Hypomagnesemia 07/30/2021   Nausea & vomiting 07/30/2021   Cellulitis of left lower extremity    Malaise 07/28/2021   Symptomatic anemia 05/14/2021   Port-A-Cath in place 04/27/2021   Genetic testing 45/12/8880   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   COPD with acute exacerbation ([place]) 04/02/2021   Cough with hemoptysis 04/01/2021   CAP (community acquired pneumonia) 04/01/2021   Family history of prostate cancer 03/20/2021   Family history of breast cancer 03/20/2021   Family history of lung cancer 03/20/2021   Goals of care, counseling/discussion 03/18/2021   Encounter for antineoplastic  chemotherapy 03/18/2021   Encounter for antineoplastic immunotherapy 03/18/2021   Primary cancer of left lower lobe of lung ([place]) 03/07/2021   Non-small cell carcinoma of left lung, stage 4 ([place]) 03/05/2021   Malnutrition of moderate degree 02/25/2021   Pleural effusion on left 02/24/2021   Hilar mass    Reactive airway disease 11/20/2020   Allergic rhinitis with non-allergic component 09/18/2020   Heartburn 09/18/2020   Coughing 09/18/2020   Malignant neoplasm of prostate ([place]) 03/04/2020   Pre-syncope 02/27/2020   Cerebral embolism with transient ischemic attack (TIA) 02/11/2020   History of stroke 80/12/4915   Embolic stroke involving cerebral artery ([place]) 02/11/2020   Secondary hypercoagulable state ([place]) 09/10/2019   Essential tremor 06/25/2019   Chest pain 05/27/2017   Demand ischemia ([place]) 05/27/2017   Morbid obesity ([place]) 05/27/2017   Carotid artery disease ([place]) 08/30/2016   Essential hypertension 08/30/2016   Diverticulitis of intestine without perforation or abscess without bleeding    Hyponatremia 06/04/2016   Diverticulitis 05/31/2016   Iron deficiency 01/05/2016   Lumbar radiculopathy, chronic 02/04/2015   SVT (supraventricular tachycardia) ([place]) 12/03/2014   PAC (premature atrial contraction) 04/05/2014   OSA on CPAP 03/27/2014   AKI (acute kidney injury) ([place]) 12/05/2013   Nausea 12/05/2013   Abdominal pain 12/05/2013   AF (paroxysmal atrial fibrillation) ([place]) 01/05/2013   CVA (cerebral infarction) 07/06/2012   Coronary artery disease    Benign essential HTN    Dyslipidemia    Shortness of breath    GERD (gastroesophageal reflux disease)     Orientation  RESPIRATION BLADDER Height & Weight     Self, Time, Situation, Place  O2 Continent Weight: 104.9 kg Height:  $Remove'6\' 1"'NpuKtHg$  (185.4 cm)  BEHAVIORAL SYMPTOMS/MOOD NEUROLOGICAL BOWEL NUTRITION STATUS      Continent Diet (Regular)  AMBULATORY STATUS COMMUNICATION OF NEEDS Skin   Limited Assist Verbally Normal                        Personal Care Assistance Level of Assistance  Bathing, Feeding, Dressing Bathing Assistance: Limited assistance Feeding assistance: Limited assistance Dressing Assistance: Limited assistance     Functional Limitations Info  Sight, Hearing, Speech Sight Info: Impaired (eyeglasses) Hearing Info: Adequate Speech Info: Adequate    SPECIAL CARE FACTORS FREQUENCY  PT (By licensed PT), OT (By licensed OT)     PT Frequency:  (5x week) OT Frequency:  (5x week)            Contractures Contractures Info: Not present    Additional Factors Info  Code Status, Allergies Code Status Info:  (DNR) Allergies Info:  (Amoxicillin, Fosinopril, Hydromorphone, Ampicillin, Codeine, Monopril (Fosinopril Sodium), Rosuvastatin)           Current Medications (11/18/2021):  This is the current hospital active medication list Current Facility-Administered Medications  Medication Dose Route Frequency Provider Last Rate Last Admin   0.9 %  sodium chloride infusion   Intravenous PRN Donne Hazel, MD   Stopped at 11/13/21 1400   acetaminophen (TYLENOL) tablet 650 mg  650 mg Oral Q6H PRN Donne Hazel, MD   650 mg at 11/18/21 1016   Or   acetaminophen (TYLENOL) suppository 650 mg  650 mg Rectal Q6H PRN Donne Hazel, MD       acidophilus (RISAQUAD) capsule 1 capsule  1 capsule Oral Daily Donne Hazel, MD   1 capsule at 11/18/21 1016   ALPRAZolam (XANAX) tablet 0.5 mg  0.5 mg Oral QHS PRN Donne Hazel, MD   0.5 mg at 11/16/21 2139   alum & mag hydroxide-simeth (MAALOX/MYLANTA) 200-200-20 MG/5ML suspension 30 mL  30 mL Oral Q6H PRN Donne Hazel, MD   30 mL at 11/05/21 1959   [START ON 11/20/2021] amiodarone (PACERONE) tablet 200 mg  200 mg Oral Daily Donne Hazel, MD       amiodarone (PACERONE) tablet 400 mg  400 mg Oral BID Donne Hazel, MD   400 mg at 11/18/21 1017   arformoterol (BROVANA) nebulizer solution 15 mcg  15 mcg Nebulization BID Donne Hazel,  MD   15 mcg at 11/18/21 0739   atorvastatin (LIPITOR) tablet 40 mg  40 mg Oral QHS Donne Hazel, MD   40 mg at 11/17/21 2101   benzonatate (TESSALON) capsule 200 mg  200 mg Oral TID PRN Donne Hazel, MD   200 mg at 11/17/21 1340   budesonide (PULMICORT) nebulizer solution 0.5 mg  0.5 mg Nebulization BID Donne Hazel, MD   0.5 mg at 11/18/21 4825   carbidopa-levodopa (SINEMET IR) 25-100 MG per tablet immediate release 1 tablet  1 tablet Oral 4 times per day Donne Hazel, MD   1 tablet at 11/18/21 1417   Carbinoxamine Maleate TABS 6 mg  6 mg Oral BID Donne Hazel, MD   6 mg at 11/18/21 1416   Chlorhexidine Gluconate Cloth 2 % PADS 6 each  6 each Topical Daily Donne Hazel, MD   6 each at 11/18/21 1022   dextromethorphan (DELSYM)  30 MG/5ML liquid 60 mg  60 mg Oral BID PRN Donne Hazel, MD   60 mg at 11/17/21 2152   diltiazem (CARDIZEM CD) 24 hr capsule 240 mg  240 mg Oral Daily Donne Hazel, MD   240 mg at 11/18/21 1016   feeding supplement (ENSURE ENLIVE / ENSURE PLUS) liquid 237 mL  237 mL Oral TID BM Donne Hazel, MD   237 mL at 11/18/21 1417   fenofibrate tablet 160 mg  160 mg Oral Q1400 Donne Hazel, MD   160 mg at 11/18/21 1419   fluticasone (FLONASE) 50 MCG/ACT nasal spray 2 spray  2 spray Each Nare Daily Donne Hazel, MD   2 spray at 53/29/92 4268   folic acid (FOLVITE) tablet 1 mg  1 mg Oral Daily Donne Hazel, MD   1 mg at 11/18/21 1017   guaiFENesin (MUCINEX) 12 hr tablet 1,200 mg  1,200 mg Oral BID Donne Hazel, MD   1,200 mg at 11/18/21 1017   ipratropium (ATROVENT) 0.03 % nasal spray 2 spray  2 spray Each Nare q AM Donne Hazel, MD   2 spray at 11/18/21 1020   lactulose (CHRONULAC) 10 GM/15ML solution 10 g  10 g Oral BID Donne Hazel, MD   10 g at 11/18/21 1016   levalbuterol (XOPENEX) nebulizer solution 0.63 mg  0.63 mg Nebulization BID Donne Hazel, MD   0.63 mg at 11/18/21 0739   levalbuterol (XOPENEX) nebulizer solution 0.63 mg  0.63 mg  Nebulization Q2H PRN Donne Hazel, MD       lip balm (CARMEX) ointment   Topical PRN Donne Hazel, MD   75 application at 34/19/62 2348   MEDLINE mouth rinse  15 mL Mouth Rinse BID Donne Hazel, MD   15 mL at 11/18/21 1417   metoCLOPramide (REGLAN) injection 10 mg  10 mg Intravenous Q6H PRN Donne Hazel, MD   10 mg at 11/09/21 2297   metoprolol tartrate (LOPRESSOR) tablet 100 mg  100 mg Oral BID Donne Hazel, MD   100 mg at 11/18/21 1016   multivitamin with minerals tablet 1 tablet  1 tablet Oral Daily Donne Hazel, MD   1 tablet at 11/18/21 1017   nitroGLYCERIN (NITROSTAT) SL tablet 0.4 mg  0.4 mg Sublingual Q5 min PRN Donne Hazel, MD       pantoprazole (PROTONIX) EC tablet 40 mg  40 mg Oral Daily Donne Hazel, MD   40 mg at 11/18/21 1017   polyethylene glycol (MIRALAX / GLYCOLAX) packet 17 g  17 g Oral BID Donne Hazel, MD   17 g at 11/17/21 1038   predniSONE (DELTASONE) tablet 30 mg  30 mg Oral Q breakfast Donne Hazel, MD   30 mg at 11/18/21 1019   Followed by   Derrill Memo ON 11/24/2021] predniSONE (DELTASONE) tablet 20 mg  20 mg Oral Q breakfast Donne Hazel, MD       Followed by   Derrill Memo ON 12/01/2021] predniSONE (DELTASONE) tablet 10 mg  10 mg Oral Q breakfast Donne Hazel, MD       Followed by   Derrill Memo ON 12/08/2021] predniSONE (DELTASONE) tablet 5 mg  5 mg Oral Q breakfast Donne Hazel, MD       revefenacin [facility]) nebulizer solution 175 mcg  175 mcg Nebulization Daily Donne Hazel, MD   175 mcg at 11/18/21 0739   rivaroxaban (XARELTO) tablet  20 mg  20 mg Oral QPM Donne Hazel, MD   20 mg at 11/17/21 1904   senna-docusate (Senokot-S) tablet 1 tablet  1 tablet Oral BID Donne Hazel, MD   1 tablet at 11/18/21 1016   sorbitol 70 % solution 30 mL  30 mL Oral Daily Donne Hazel, MD   30 mL at 11/15/21 1012   tamsulosin (FLOMAX) capsule 0.4 mg  0.4 mg Oral QPC supper Donne Hazel, MD   0.4 mg at 11/17/21 1904   topiramate (TOPAMAX) tablet 100  mg  100 mg Oral BID Donne Hazel, MD   100 mg at 11/18/21 1021     Discharge Medications: Please see discharge summary for a list of discharge medications.  Relevant Imaging Results:  Relevant Lab Results:   Additional Information SS#[id number]  Chalsea Darko, Juliann Pulse, [place]

## 2021-11-18 NOTE — TOC Progression Note (Addendum)
Transition of Care [facility]) - Progression Note    Patient Details  Name: Corey [name] MRN: [id number] Date of Birth: 12-28-1945  [facility] of [facility]) CM/SW Contact  Billye Pickerel, Juliann Pulse, RN Phone Number: 11/18/2021, 11:44 AM  Clinical Narrative:  Per CIR needs peer to peer by attending-await response/outcome.     Expected Discharge Plan: IP Rehab Facility Barriers to Discharge: Insurance Authorization  Expected Discharge Plan and Services Expected Discharge Plan: [place]                                               Social Determinants of Health (SDOH) Interventions    Readmission Risk Interventions Readmission Risk Prevention Plan 08/05/2021  Transportation Screening Complete  PCP or Specialist appointment within 3-5 days of discharge Complete  HRI or [place] Complete  SW Recovery Care/Counseling Consult Complete  [place] Not Applicable  Some recent data might be hidden

## 2021-11-18 NOTE — Assessment & Plan Note (Signed)
Plan for possible rehab at time of d/c. Therapy recs for CIR. -CIR was consulted

## 2021-11-18 NOTE — Assessment & Plan Note (Addendum)
Currently stable.  Continue bronchodilators as needed.  Continue Yupelri.  Started on slow tapering course. On 4 L of oxygen per minute at baseline

## 2021-11-18 NOTE — TOC Progression Note (Signed)
Transition of Care [facility]) - Progression Note    Patient Details  Name: Corey [name] MRN: [id number] Date of Birth: 1945-11-17  Transition of Care [facility]) CM/SW Contact  Hetvi Shawhan, Juliann Pulse, RN Phone Number: 11/18/2021, 2:57 PM  Clinical Narrative: Faxed out per patient agreement;left vm w/Renee spouse -will await bed offers.      Expected Discharge Plan: Skilled Nursing Facility Barriers to Discharge: Insurance Authorization  Expected Discharge Plan and Services Expected Discharge Plan: [place]                                               Social Determinants of Health (SDOH) Interventions    Readmission Risk Interventions Readmission Risk Prevention Plan 08/05/2021  Transportation Screening Complete  PCP or Specialist appointment within 3-5 days of discharge Complete  HRI or [place] Complete  SW Recovery Care/Counseling Consult Complete  [place] Not Applicable  Some recent data might be hidden

## 2021-11-18 NOTE — Progress Notes (Signed)
Inpatient Rehab Admissions Coordinator:   Insurance denied CIR request. Pt. Does not wish to appeal, states he is amenable to SNF of short term rehab. TOC notified. CIR will sign off.   Clemens Catholic, [place], [place] Admissions Coordinator  [phone] ([place]) [phone] (office)

## 2021-11-18 NOTE — Progress Notes (Signed)
Physical Therapy Treatment Patient Details Name: Corey [name] MRN: [id number] DOB: [birth year] Today's Date: 11/18/2021   History of Present Illness 76yo male who presented on 10/25 with L LE edema and generalized weakness. Also with recent hx of PNA. Admitted with L LE cellulitis. PMH NSCLC on chemo, Afib, CAD, chronic LBP with radiculopathy, HLD, HTN, prostate CA, CVA, ACDF, back surgery, L rotator cuff repair, TKA    PT Comments    Pt very fatigued this session, limited to short ambulation bouts in room with RW and min A+2 for safety, requires quick and frequent seated rest breaks to recover. Pt on 4L O2 with SpO2 90-100%, dyspnea 4/4. Cues for UE and BLE engagement to power up to stand, requires min guard from elevate bed, but mod A+2 from low seated toilet and chair in room. Pt tolerates standing for max of ~3 minutes at a time before needing seated rest. Pt very limited by fatigue, updated recommendation to SNF due to decreased activity tolerance.    Recommendations for follow up therapy are one component of a multi-disciplinary discharge planning process, led by the attending physician.  Recommendations may be updated based on patient status, additional functional criteria and insurance authorization.  Follow Up Recommendations  Skilled nursing-short term rehab (<3 hours/day)     Assistance Recommended at Discharge Frequent or constant Supervision/Assistance  Patient can return home with the following Assist for transportation;Help with stairs or ramp for entrance;Assistance with cooking/housework;A lot of help with walking and/or transfers;A lot of help with bathing/dressing/bathroom   Equipment Recommendations  None recommended by PT    Recommendations for Other Services       Precautions / Restrictions Precautions Precautions: Fall Precaution Comments: O2 dep at baseline Restrictions Weight Bearing Restrictions: No     Mobility  Bed Mobility Overal bed mobility: Needs  Assistance Bed Mobility: Supine to Sit  Supine to sit: Supervision, HOB elevated  General bed mobility comments: use of bedrail, therapist had to clear bedsheets from pt's feet    Transfers Overall transfer level: Needs assistance Equipment used: Rolling walker (2 wheels) Transfers: Sit to/from Stand Sit to Stand: Min guard, From elevated surface, Mod assist, +2 physical assistance  General transfer comment: pt completes 2 STS from elevated EOB with min guard, VC for hand placement and increased time to achieve upright standing; pt requires mod +2 from low seated toilet and low seated chair with heavy use of BUE and VC to engage bil glute/quads    Ambulation/Gait Ambulation/Gait assistance: Min assist, +2 safety/equipment Gait Distance (Feet): 10 Feet (10ft, 58ft, and 28ft with seated rest breaks between) Assistive device: Rolling walker (2 wheels) Gait Pattern/deviations: Step-to pattern, Decreased stride length, Shuffle, Trunk flexed Gait velocity: decreased  General Gait Details: pt ambulates very short spurts in room with RW to get to restroom then return to recliner, maintains flexed/crouched posture, clears feet with each step but with fatigue begins to shuffle feet, requires seated rest break frequently due to fatiguing, dyspnea 4/4 on 4L O2 with SpO2 90-100%, educated on pursed lip breathing   Stairs             Wheelchair Mobility    Modified Rankin (Stroke Patients Only)       Balance Overall balance assessment: Needs assistance  Standing balance support: During functional activity, Bilateral upper extremity supported, Reliant on assistive device for balance Standing balance-Leahy Scale: Poor     Cognition Arousal/Alertness: Awake/alert Behavior During Therapy: WFL for tasks assessed/performed Overall Cognitive Status: Within Functional  Limits for tasks assessed     Exercises      General Comments        Pertinent Vitals/Pain Pain Assessment Pain  Assessment: No/denies pain    Home Living                          Prior Function            PT Goals (current goals can now be found in the care plan section) Acute Rehab PT Goals Patient Stated Goal: to go back to inpatient rehab PT Goal Formulation: With patient Time For Goal Achievement: 11/30/21 Potential to Achieve Goals: Good Progress towards PT goals: Progressing toward goals    Frequency    Min 3X/week      PT Plan Discharge plan needs to be updated    Co-evaluation PT/OT/SLP Co-Evaluation/Treatment: Yes Reason for Co-Treatment: For patient/therapist safety;To address functional/ADL transfers PT goals addressed during session: Mobility/safety with mobility;Proper use of DME;Balance        AM-PAC PT "6 Clicks" Mobility   Outcome Measure  Help needed turning from your back to your side while in a flat bed without using bedrails?: A Little Help needed moving from lying on your back to sitting on the side of a flat bed without using bedrails?: A Little Help needed moving to and from a bed to a chair (including a wheelchair)?: A Lot Help needed standing up from a chair using your arms (e.g., wheelchair or bedside chair)?: A Lot Help needed to walk in hospital room?: A Lot Help needed climbing 3-5 steps with a railing? : Total 6 Click Score: 13    End of Session Equipment Utilized During Treatment: Oxygen;Gait belt Activity Tolerance: Patient tolerated treatment well;Patient limited by fatigue Patient left: in chair;with call bell/phone within reach;with chair alarm set Nurse Communication: Mobility status PT Visit Diagnosis: Muscle weakness (generalized) (M62.81);Difficulty in walking, not elsewhere classified (R26.2)     Time: 0350-0938 PT Time Calculation (min) (ACUTE ONLY): 36 min  Charges:  $Therapeutic Activity: 8-22 mins                      Tori Konner Warrior PT, DPT 11/18/21, 12:41 PM

## 2021-11-18 NOTE — Progress Notes (Signed)
Occupational Therapy Progress Note  Upon arrival to patient's room, seated in chair near bathroom with PT and nurse tech present. Patient had ambulated to bathroom but was unable to ambulate back to recliner chair due to fatigue. Patient needed recliner chair placed next to visitor chair and mod A +2 to power up to standing. Patient with tremulous lower extremities with knees buckling and difficulty maintaining posture. With mod +2 able to pivot with walker to recliner chair. Patient O2 maintain 90% or greater on 4L during transfer. D/C recommendations updated per CM AIR declined and patient with limited endurance this session therefore recommend SNF rehab at D/C.     11/18/21 1300  OT Visit Information  Last OT Received On 11/18/21  Assistance Needed +2  PT/OT/SLP Co-Evaluation/Treatment Yes  Reason for Co-Treatment For patient/therapist safety;To address functional/ADL transfers  PT goals addressed during session Mobility/safety with mobility  OT goals addressed during session ADL's and self-care  History of Present Illness 76yo male who presented on 10/25 with L LE edema and generalized weakness. Also with recent hx of PNA. Admitted with L LE cellulitis. PMH NSCLC on chemo, Afib, CAD, chronic LBP with radiculopathy, HLD, HTN, prostate CA, CVA, ACDF, back surgery, L rotator cuff repair, TKA  Precautions  Precautions Fall  Precaution Comments O2 dep at baseline  Restrictions  Weight Bearing Restrictions No  Pain Assessment  Pain Assessment No/denies pain  Cognition  Arousal/Alertness Awake/alert  Behavior During Therapy WFL for tasks assessed/performed  Overall Cognitive Status Within Functional Limits for tasks assessed  ADL  Overall ADL's  Needs assistance/impaired  Grooming Wash/dry face;Wash/dry hands;Set up;Sitting  Toilet Transfer Moderate assistance;+2 for physical assistance;+2 for safety/equipment;Stand-pivot;Rolling walker (2 wheels)  Toilet Transfer Details (indicate cue type  and reason) Tremulous lower extremities and mod A +2 to power up to standing and complete pivot with walker to recliner.  Functional mobility during ADLs Moderate assistance;+2 for physical assistance;+2 for safety/equipment;Rolling walker (2 wheels)  Balance  Overall balance assessment Needs assistance  Standing balance support During functional activity;Reliant on assistive device for balance  Standing balance-Leahy Scale Poor  General Comments  General comments (skin integrity, edema, etc.) VSS on 4L O2  OT - End of Session  Equipment Utilized During Treatment Oxygen;Gait belt;Rolling walker (2 wheels)  Activity Tolerance Patient limited by fatigue  Patient left in chair;with call bell/phone within reach;with chair alarm set  Nurse Communication Mobility status  OT Assessment/Plan  OT Plan Discharge plan needs to be updated  OT Visit Diagnosis Unsteadiness on feet (R26.81);Muscle weakness (generalized) (M62.81)  OT Frequency (ACUTE ONLY) Min 2X/week  Follow Up Recommendations Skilled nursing-short term rehab (<3 hours/day)  Assistance recommended at discharge Frequent or constant Supervision/Assistance  Patient can return home with the following Two people to help with walking and/or transfers;A lot of help with bathing/dressing/bathroom;Assist for transportation;Help with stairs or ramp for entrance  OT Equipment None recommended by OT  AM-PAC OT "6 Clicks" Daily Activity Outcome Measure (Version 2)  Help from another person eating meals? 4  Help from another person taking care of personal grooming? 3  Help from another person toileting, which includes using toliet, bedpan, or urinal? 2  Help from another person bathing (including washing, rinsing, drying)? 2  Help from another person to put on and taking off regular upper body clothing? 3  Help from another person to put on and taking off regular lower body clothing? 2  6 Click Score 16  Progressive Mobility  What is the highest  level  of mobility based on the progressive mobility assessment? Level 3 (Stands with assist) - Balance while standing  and cannot march in place  Activity Transferred from bed to chair  OT Goal Progression  Progress towards OT goals Progressing toward goals  Acute Rehab OT Goals  Patient Stated Goal Push my tolerance  OT Goal Formulation With patient  Time For Goal Achievement 11/28/21  Potential to Achieve Goals Good  ADL Goals  Pt Will Perform Lower Body Dressing with supervision;sit to/from stand  Pt Will Transfer to Toilet with supervision;regular height toilet;ambulating;grab bars  Pt Will Perform Toileting - Clothing Manipulation and hygiene with supervision;sit to/from stand  Additional ADL Goal #1 Patient will stand at sink to perform grooming task as evidence of improving activity tolerance  OT Time Calculation  OT Start Time (ACUTE ONLY) 1154  OT Stop Time (ACUTE ONLY) 1206  OT Time Calculation (min) 12 min  OT General Charges  $OT Visit 1 Visit  OT Treatments  $Self Care/Home Management  8-22 mins   Delbert Phenix OT OT pager: [phone]

## 2021-11-19 MED ORDER — ALPRAZOLAM 0.5 MG PO TABS: 0.5000 mg | ORAL_TABLET | Freq: Every evening | ORAL | 0 refills | Status: DC | PRN

## 2021-11-19 MED ORDER — TRAMADOL HCL 50 MG PO TABS: 50.0000 mg | ORAL_TABLET | Freq: Four times a day (QID) | ORAL | 0 refills | Status: DC | PRN

## 2021-11-19 NOTE — TOC Progression Note (Addendum)
Transition of Care [facility]) - Progression Note    Patient Details  Name: Corey [name] MRN: [id number] Date of Birth: 06-21-1946  Transition of Care [facility]) CM/SW Contact  Lenae Wherley, Juliann Pulse, RN Phone Number: 11/19/2021, 10:16 AM  Clinical Narrative: Bed offers given to Renee spouse-will also leave list in rm-await choice prior starting auth.  -12:22p-faxed clinicals to [facility] [id number] auth-also await  bed choice from Renee-spouse. -3p-Renee chose [facility] rep [employer].  1. 1.3 masonic and [facility] [place], [place] [address] [phone] Overall rating Average 2. 1.6 mi [place] [facility] [place], [place] [address] [phone] Overall rating Much below average 3. 2.1 mi [place], [place] [address] [phone] Overall rating Much below average 4. 2.5 mi [facility] [place], [place] [address] [phone] Overall rating Below average 5. 2.8 mi [facility] at the [place], [place] [address] [phone] Overall rating Average 6. 2.8 mi [facility] and [facility] [address] [place], [place] [address] [phone] Overall rating Much below average 7. [facility] [name], [place] [address] [phone] Overall rating Much above average 8. 3.6 [place] [address] [place], [place] [address] [phone] Overall rating Average 9. 3.6 mi [facility] [address], [place] [address] [phone] Overall rating Much below average 10. 3.9 mi [place], [place] [address] [phone] Overall rating Much below average 11. 4.4  mi [facility] [place], [place] [address] [phone] Overall rating Much above average 12. 5.5 mi [facility] [address] [place], [place] [address] [phone] Overall rating Above average 13. 8.2 [facility] [place], [place] [address] [phone] Overall rating Much above average 14. 9 mi [facility] [address] [place], [place] [address] [phone] Overall rating Above average 15. 9.1 mi [facility] and Annabella Cartwright [place], [place] [address] [phone] Overall rating Average 16. 9.2 mi [facility] [address] [place], [place] [address] [phone] Overall rating Much above average 17. 10.8 mi [place] [facility] [address] [place], [place] [address] [phone] Overall rating Much above average 18. 12.6 mi [facility] [address] [place], [place] [address] [phone] Overall rating Much below average 19. 12.8 [facility] [place], [place] [address] [phone] Overall rating Much below average 20. 14.2 mi [facility] [place] [facility] [address] [place], [place] [address] [phone] Overall rating Below average 21. 14.4 mi [facility] at [place], [place] [address] [phone] Overall rating Above average 22. 14.8 mi [place] and [facility] [place], [place] [address] [phone] Overall rating Much above average 23. 14.9 [place] [address] [place], [place] [address] [phone] Overall rating Much below average 24. 16.5 mi [facility] [address] [place], [place] [address] [phone] Overall rating Average 25. 16.7 mi [facility] [place], [place] [address] [phone] Overall rating Much above average 26.  17.9 mi [place], [place] [address] [phone] Overall rating Below average 27. 65.7 [facility] [address] [place], [place] [address] [phone] Overall rating Much below average 28. 19.7 mi [place] [address] [place], [place] [address] [phone] Overall rating Much below average 29. 20 [address] at the [facility] at [facility], [place] [address] [phone] Overall rating Much above average 30. 21.1 mi [facility] and [facility] [place], [place] [address] [phone] Overall rating Much below average 31. 21.6 mi [address] [place], [place] [address] [phone] Overall rating Below average 32. 21.6 mi [facility] [address] [place], [place] [address] [phone] Overall rating Average 33. 21.8 [facility] [address] [place], [place] [address] [phone] Overall rating Much above average 34. [address] [place], [place] [address] [phone] Overall rating Much above average 35. 22.6 mi [facility] [address] [place], [place] [address] [phone] Overall rating Average 36. 22.7 mi [facility] [place], [place] [address] [phone] Overall rating Much below average 37. 23.3 mi [facility] [address] [place], [place] [address] [phone] Overall rating Above average 38. 23.5 [place], [place] [address] [phone] Overall rating Not available18 39. 24.1 mi [place] [facility]  [place] [address] [place], [place] [address] [phone] Overall rating Much below average 40. 24.2 mi [place] [address] [place], [place] [address] [phone] Overall rating Below average 41. 24.4 [facility] [address] [place], [place] [address] [phone] Overall rating Much below average 42. 24.5 mi [place], [place] [address] [phone] Overall rating Above average To explore    Expected Discharge Plan: [place] Barriers to Discharge: Continued Medical Work up  Expected Discharge Plan and Services Expected Discharge Plan: [place] Determinants of Health (SDOH) Interventions    Readmission Risk Interventions Readmission Risk Prevention Plan 08/05/2021  Transportation Screening Complete  PCP or Specialist appointment within 3-5 days of discharge Complete  HRI or [place] Complete  SW Recovery Care/Counseling Consult Complete  [place] Not Applicable  Some recent data might be hidden

## 2021-11-19 NOTE — Progress Notes (Signed)
PROGRESS NOTE  TRES GRZYWACZ  OMV:[id number] DOB: 04-18-1946 DOA: 11/03/2021 PCP: [name], MD   Brief Narrative: Corey [name] is a 76 y.o. male with PMH stage IV NSCLC with liver mets, PAF, CAD, chronic low back p ain, anemia, anxiety, osteoarthritis, HLD, HTN, GERD, OSA on CPAP, COPD on 4 L of oxygen per minute at home prostate cancer who presented with approximately 4 days of progressively worsening weakness and ongoing productive cough of yellow sputum with associated pleuritic chest pain, malaise, dyspnea.  He recently deceived chemotherapy within the past week as well.  Due to continued symptoms of feeling poorly, he presented for further evaluation. On evaluation in the ER, he underwent CXR which showed ongoing extensive irregular opacities involving the left lung not significantly changed from prior imaging.  Prior CT chest on 10/15/2021 showed a stable dense consolidation in the lingula and left lower lobe. Notable labs included WBC 2.8, platelet count 158.  Sodium 128.  BNP 187. There was concern for possible pneumonia and he was started on vancomycin and cefepime.currently respiratory status is stable.  He also developed A. fib with RVR and was started on a Cardizem drip.  Currently his rate is well controlled on oral medicines.  Cardiology following, signed off with plan for outpatient DCCV if patient remains in A-fib.  PT has recommended inpatient rehab on discharge.  Hemodynamically stable for discharge whenever possible.   Assessment & Plan:  Principal Problem:   Atrial fibrillation with RVR (HCC) Active Problems:   Coronary artery disease   Benign essential HTN   Dyslipidemia   GERD (gastroesophageal reflux disease)   OSA on CPAP   Hyponatremia   Non-small cell carcinoma of left lung, stage 4 (HCC)   CAP (community acquired pneumonia)   COPD with acute exacerbation (HCC)   Physical deconditioning   Leukopenia   Macrocytic anemia   Prolonged Q-T interval on ECG   Stasis  dermatitis   Dysarthria   Pressure injury of skin   Assessment and Plan: * Atrial fibrillation with RVR ([place])- (present on admission) - on Tikosyn, Toprol, and Xarelto at home - Patient noted to be in A. fib with RVR on admission - cardiology consulted for further assistance given complexity of med regimen and prolonged QTc - Given digoxin load on 2/7.  - Weaned of cardizem gtt, up to 240mg  daily -Now on PO amiodarone per Cardiology, would not continue long-term. Currently on 400mg  bid x 7 days total, then 200mg  daily x 7 more days -Per Cardiology, plan for DCCV if patient remains in afib as outpt after 3 weeks of uninterrupted AC -metoprolol cont at 100mg  bid per Cardiology -Xarelto continued -Cardiology has since signed off  Dysarthria - acutely noted on the evening of 2/6 with concern for a stroke. 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% left ICA stenosis  - no obvious stroke on [facility]; does have chronic left parietal lobe infarct noted -Follow-up MRI brain noted to be unremarkable  Stasis dermatitis - more prominent on LLE; no overt cellulitis signs as nontender as well - B/L LE duplex negative for DVT as well, performed 11/06/21 - likely caused by ongoing LE edema - some improvement in LE edema with lasix  Prolonged Q-T interval on ECG - tikosyn now officially discontinued per cardiology - continue monitoring electrolytes; goal K>4, Mg>2  Macrocytic anemia- (present on admission) - no overt bleeding - downtrend suspected in setting of chemo recently   Leukopenia- (present on admission) - likely multifactorial from underlying infection plus  recent chemo  -resolved  Physical deconditioning Plan for possible rehab at time of d/c. Therapy recs for CIR. -CIR was consulted   COPD with acute exacerbation ([place])- (present on admission) Currently stable.  Continue bronchodilators as needed.  Continue Yupelri.  Started on slow tapering course. On 4 L of oxygen per minute at baseline  CAP  (community acquired pneumonia)- (present on admission) - ongoing LLL consolidation which may be acutely worse on admission; symptomatically he felt worse over the past ~1 week prior to admission - Completed abx course - cultures had remained neg -On baseline oxygen requirement  Non-small cell carcinoma of left lung, stage 4 ([place])- (present on admission) - follows outpatient with Dr. [name] -Palliative Care consulted and following. Pt has improved markedly thus far and pt is keen on improving further -Pt has not ruled out possibility of pursuing chemo moving forward   Hyponatremia- (present on admission) - appeared clinically volume depleted on admission - unfortunately edema was a big issue in LE; given hypoalbuminemia he is likely 3rd spacing. FeNa calculated <<1% on 11/06/21 consistent with intravascular volume depletion and worsening hyponatremia at that time - pt is s/p albumin - Na had improved with albumin and lasix -Sodium level is stable now  OSA on CPAP - continue nightly CPAP  GERD (gastroesophageal reflux disease)- (present on admission) - continue PPI  Dyslipidemia- (present on admission) - Continue Lipitor  Benign essential HTN- (present on admission) - BP remains stable and controlled at this time - continue monitoring   Coronary artery disease- (present on admission) - Continue Lipitor, BB -No anginal symptoms  Sepsis (HCC)-resolved as of 11/08/2021, (present on admission) - neutropenia, tachycardia, tachypnea, lung source suspected - see pna treatment          Nutrition Problem: Inadequate oral intake Etiology: cancer and cancer related treatments, decreased appetite (left femoral neck non-small cell stage IV carcinoma)    DVT prophylaxis:Place TED hose Start: 11/10/21 1332 rivaroxaban (XARELTO) tablet 20 mg     Code Status: DNR  Family Communication: Wife on phone on 2/16  Patient status:inpatient  Patient is from :Home  Anticipated  discharge to:SNF  Estimated DC date:Whenever bed is available   Consultants: None  Procedures:None  Antimicrobials:  Anti-infectives (From admission, onward)    Start     Dose/Rate Route Frequency Ordered Stop   11/10/21 2000  metroNIDAZOLE (FLAGYL) IVPB 500 mg        500 mg 100 mL/hr over 60 Minutes Intravenous Every 12 hours 11/10/21 1324 11/13/21 2142   11/09/21 2000  metroNIDAZOLE (FLAGYL) IVPB 500 mg  Status:  Discontinued        500 mg 100 mL/hr over 60 Minutes Intravenous Every 12 hours 11/09/21 1829 11/10/21 1324   11/04/21 1400  vancomycin (VANCOREADY) IVPB 1750 mg/350 mL  Status:  Discontinued        1,750 mg 175 mL/hr over 120 Minutes Intravenous Every 24 hours 11/03/21 1533 11/04/21 1351   11/03/21 2200  ceFEPIme (MAXIPIME) 2 g in sodium chloride 0.9 % 100 mL IVPB        2 g 200 mL/hr over 30 Minutes Intravenous Every 8 hours 11/03/21 1525 11/10/21 1438   11/03/21 1230  vancomycin (VANCOREADY) IVPB 2000 mg/400 mL        2,000 mg 200 mL/hr over 120 Minutes Intravenous  Once 11/03/21 1217 11/03/21 1518   11/03/21 1215  vancomycin (VANCOCIN) IVPB 1000 mg/200 mL premix  Status:  Discontinued        1,000  mg 200 mL/hr over 60 Minutes Intravenous  Once 11/03/21 1211 11/03/21 1217   11/03/21 1215  ceFEPIme (MAXIPIME) 2 g in sodium chloride 0.9 % 100 mL IVPB        2 g 200 mL/hr over 30 Minutes Intravenous  Once 11/03/21 1211 11/03/21 1311   11/03/21 1215  azithromycin (ZITHROMAX) 500 mg in sodium chloride 0.9 % 250 mL IVPB        500 mg 250 mL/hr over 60 Minutes Intravenous  Once 11/03/21 1211 11/03/21 1423       Subjective: Patient seen and examined at the bedside this morning.  Hemodynamically stable.  No complaints today.  Sitting in the chair, comfortable, on baseline oxygen requirement  Objective: Vitals:   11/19/21 0726 11/19/21 0731 11/19/21 1118 11/19/21 1129  BP:   115/80 115/80  Pulse:   93   Resp:      Temp:      TempSrc:      SpO2: 100% 100%     Weight:      Height:        Intake/Output Summary (Last 24 hours) at 11/19/2021 1153 Last data filed at 11/19/2021 0900 Gross per 24 hour  Intake 1080 ml  Output 3150 ml  Net -2070 ml   Filed Weights   11/15/21 1842  Weight: 104.9 kg    Examination:    General exam: Overall comfortable, not in distress, obese, HEENT: PERRL Respiratory system: Crackles on the left side, no wheezing Cardiovascular system: S1 & S2 heard, RRR.  Chemo-Port on the right chest Gastrointestinal system: Abdomen is nondistended, soft and nontender. Central nervous system: Alert and oriented Extremities: No edema, no clubbing ,no cyanosis Skin: No rashes, no ulcers,no icterus      Data Reviewed: I have personally reviewed following labs and imaging studies  CBC: Recent Labs  Lab 11/13/21 0310 11/14/21 0505 11/15/21 0501 11/18/21 0329  WBC 8.5 8.1 7.9 9.7  NEUTROABS 6.3 5.8 5.8  --   HGB 8.7* 9.0* 9.0* 8.8*  HCT 26.9* 28.1* 29.3* 27.9*  MCV 104.7* 105.6* 107.7* 108.1*  PLT 150 185 195 361   Basic Metabolic Panel: Recent Labs  Lab 11/13/21 0310 11/14/21 0505 11/15/21 0501 11/16/21 0456 11/17/21 0359 11/18/21 0329  NA 132* 133* 133* 133* 133* 135  K 4.3 4.0 3.8 4.2 3.8 4.0  CL 97* 100 99 100 101 100  CO2 28 28 28 27 26 27   GLUCOSE 114* 100* 94 109* 111* 84  BUN 16 19 20 22  25* 25*  CREATININE 0.82 0.79 0.66 0.77 0.83 0.71  CALCIUM 8.8* 8.8* 8.8* 8.6* 8.7* 8.7*  MG 1.9 1.9  --   --   --   --      No results found for this or any previous visit (from the past 240 hour(s)).   Radiology Studies: No results found.  Scheduled Meds:  acidophilus  1 capsule Oral Daily   [START ON 11/20/2021] amiodarone  200 mg Oral Daily   amiodarone  400 mg Oral BID   arformoterol  15 mcg Nebulization BID   atorvastatin  40 mg Oral QHS   budesonide  0.5 mg Nebulization BID   carbidopa-levodopa  1 tablet Oral 4 times per day   Carbinoxamine Maleate  6 mg Oral BID   Chlorhexidine Gluconate  Cloth  6 each Topical Daily   diltiazem  240 mg Oral Daily   feeding supplement  237 mL Oral TID BM   fenofibrate  160 mg Oral Q1400  fluticasone  2 spray Each Nare Daily   folic acid  1 mg Oral Daily   guaiFENesin  1,200 mg Oral BID   ipratropium  2 spray Each Nare q AM   lactulose  10 g Oral BID   levalbuterol  0.63 mg Nebulization BID   mouth rinse  15 mL Mouth Rinse BID   metoprolol tartrate  100 mg Oral BID   multivitamin with minerals  1 tablet Oral Daily   pantoprazole  40 mg Oral Daily   polyethylene glycol  17 g Oral BID   predniSONE  30 mg Oral Q breakfast   Followed by   Derrill Memo ON 11/24/2021] predniSONE  20 mg Oral Q breakfast   Followed by   Derrill Memo ON 12/01/2021] predniSONE  10 mg Oral Q breakfast   Followed by   Derrill Memo ON 12/08/2021] predniSONE  5 mg Oral Q breakfast   revefenacin  175 mcg Nebulization Daily   rivaroxaban  20 mg Oral QPM   senna-docusate  1 tablet Oral BID   sorbitol  30 mL Oral Daily   tamsulosin  0.4 mg Oral QPC supper   topiramate  100 mg Oral BID   Continuous Infusions:  sodium chloride Stopped (11/13/21 1400)     LOS: 16 days   Shelly Coss, MD [facility] P2/16/2023, 11:53 AM

## 2021-11-19 NOTE — Plan of Care (Signed)

## 2021-11-20 MED ORDER — PREDNISONE 20 MG PO TABS: 20.0000 mg | ORAL_TABLET | Freq: Every day | ORAL | 0 refills | Status: AC

## 2021-11-20 MED ORDER — AMIODARONE HCL 200 MG PO TABS: 200.0000 mg | ORAL_TABLET | Freq: Every day | ORAL | 0 refills | Status: DC

## 2021-11-20 MED ORDER — HEPARIN SOD (PORK) LOCK FLUSH 100 UNIT/ML IV SOLN
500.0000 [IU] | INTRAVENOUS | Status: AC | PRN
  Administered 2021-11-20: 500 [IU]

## 2021-11-20 MED ORDER — POLYETHYLENE GLYCOL 3350 17 G PO PACK: 17.0000 g | PACK | Freq: Two times a day (BID) | ORAL | 0 refills | Status: DC

## 2021-11-20 MED ORDER — SENNOSIDES-DOCUSATE SODIUM 8.6-50 MG PO TABS: 1.0000 | ORAL_TABLET | Freq: Two times a day (BID) | ORAL | Status: AC

## 2021-11-20 MED ORDER — PREDNISONE 10 MG PO TABS: 10.0000 mg | ORAL_TABLET | Freq: Every day | ORAL | 0 refills | Status: AC

## 2021-11-20 MED ORDER — METOPROLOL TARTRATE 50 MG PO TABS
50.0000 mg | ORAL_TABLET | Freq: Two times a day (BID) | ORAL | Status: DC
  Administered 2021-11-20: 50 mg via ORAL
  Filled 2021-11-20: qty 1

## 2021-11-20 MED ORDER — DILTIAZEM HCL ER COATED BEADS 120 MG PO CP24: 120.0000 mg | ORAL_CAPSULE | Freq: Every day | ORAL | Status: DC

## 2021-11-20 MED ORDER — PREDNISONE 5 MG PO TABS: 5.0000 mg | ORAL_TABLET | Freq: Every day | ORAL | 0 refills | Status: AC

## 2021-11-20 MED ORDER — PREDNISONE 10 MG PO TABS: 30.0000 mg | ORAL_TABLET | Freq: Every day | ORAL | 0 refills | Status: DC

## 2021-11-20 NOTE — Progress Notes (Signed)
Report called to [facility] rm # [id number] [phone], spoke to receiving nurse Biance V.   [facility] will transport pt to facility.

## 2021-11-20 NOTE — Plan of Care (Signed)
°  Problem: Cardiac: Goal: Ability to achieve and maintain adequate cardiopulmonary perfusion will improve Outcome: Progressing   Problem: Clinical Measurements: Goal: Ability to maintain clinical measurements within normal limits will improve Outcome: Progressing Goal: Cardiovascular complication will be avoided Outcome: Progressing   Problem: Pain Managment: Goal: General experience of comfort will improve Outcome: Progressing

## 2021-11-20 NOTE — TOC Transition Note (Signed)
Transition of Care [facility]) - CM/SW Discharge Note   Patient Details  Name: CEJAY CAMBRE MRN: [id number] Date of Birth: 08/06/1946  Transition [facility] CM/SW Contact:  Dessa Phi, RN Phone Number: 11/20/2021, 1:40 PM   Clinical Narrative: d/c today [place] rm#[id number] call report [phone]. [facility] called. No further CM needs.      Final next level of care: Skilled Nursing Facility Barriers to Discharge: No Barriers Identified   Patient Goals and CMS Choice Patient states their goals for this hospitalization and ongoing recovery are:: rehab CMS Medicare.gov Compare Post Acute Care list provided to:: Patient Represenative (must comment)    Discharge Placement   Existing PASRR number confirmed : 11/18/21          Patient chooses bed at: [facility] Patient to be transferred to facility by: [place] Name of family member notified: Renee Patient and family notified of of transfer: 11/20/21  Discharge Plan and Services                                     Social Determinants of Health ([place]) Interventions     Readmission Risk Interventions Readmission Risk Prevention Plan 08/05/2021  Transportation Screening Complete  PCP or Specialist appointment within 3-5 days of discharge Complete  HRI or [place] Complete  SW Recovery Care/Counseling Consult Complete  [place] Not Applicable  Some recent data might be hidden

## 2021-11-20 NOTE — Discharge Summary (Signed)
Physician Discharge Summary  Corey [name]:[id number] DOB: 10/20/1945 DOA: 11/03/2021  PCP: [name], MD  Admit date: 11/03/2021 Discharge date: 11/20/2021  Admitted From: Home Disposition:  Home  Discharge Condition:Stable CODE STATUS:DNR Diet recommendation: Heart Healthy   Brief/Interim Summary: Corey [name] is a 76 y.o. male with PMH stage IV NSCLC with liver mets, PAF, CAD, chronic low back p ain, anemia, anxiety, osteoarthritis, HLD, HTN, GERD, OSA on CPAP, COPD on 4 L of oxygen per minute at home prostate cancer who presented with approximately 4 days of progressively worsening weakness and ongoing productive cough of yellow sputum with associated pleuritic chest pain, malaise, dyspnea.  He recently deceived chemotherapy within the past week as well.  Due to continued symptoms of feeling poorly, he presented for further evaluation. On evaluation in the ER, he underwent CXR which showed ongoing extensive irregular opacities involving the left lung not significantly changed from prior imaging.  Prior CT chest on 10/15/2021 showed a stable dense consolidation in the lingula and left lower lobe. Notable labs included WBC 2.8, platelet count 158.  Sodium 128.  BNP 187. There was concern for possible pneumonia and he was started on vancomycin and cefepime.currently respiratory status is stable.  He also developed A. fib with RVR and was started on a Cardizem drip.  Currently his rate is well controlled on oral medicines.  Cardiology following, signed off with plan for outpatient DCCV if patient remains in A-fib.  PT had recommended inpatient rehab , but declined by his insulin so he is being discharged today to a skilled nursing facility.  He will follow-up with cardiology as an outpatient.  Following problems were addressed during his hospitalization:  Atrial fibrillation with RVR ([place])- (present on admission) - on Tikosyn, Toprol, and Xarelto at home - Patient noted to be in A. fib with  RVR on admission - cardiology consulted for further assistance given complexity of med regimen and prolonged QTc - Given digoxin load on 2/7.  - Weaned of cardizem gtt, up to 240mg  daily,now tapered to 120 mg daily -Now on PO amiodarone per Cardiology, would not continue long-term.  -Per Cardiology, plan for DCCV if patient remains in afib as outpt after 3 weeks of uninterrupted AC -Continue toprol at 25 mg daily because of soft  blood pressure -Xarelto continued -Cardiology has since signed off, needs to follow-up as outpatient   Dysarthria - acutely noted on the evening of 2/6 with concern for a stroke. 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% left ICA stenosis  - no obvious stroke on [facility]; does have chronic left parietal lobe infarct noted -Follow-up MRI brain noted to be unremarkable   Stasis dermatitis - more prominent on LLE; no overt cellulitis signs as nontender as well - B/L LE duplex negative for DVT as well, performed 11/06/21 - likely caused by ongoing LE edema - some improvement in LE edema with lasix   Prolonged Q-T interval on ECG - tikosyn now officially discontinued per cardiology - continue monitoring electrolytes; goal K>4, Mg>2   Macrocytic anemia- (present on admission) - no overt bleeding - downtrend suspected in setting of chemo recently    Leukopenia- (present on admission) - likely multifactorial from underlying infection plus recent chemo  -resolved   Physical deconditioning Plan for possible rehab at time of d/c. Therapy recs for CIR. -CIR was consulted     COPD with acute exacerbation ([place])- (present on admission) Currently stable.  Continue bronchodilators as needed.  Continue Yupelri.  Started on slow  tapering course. On 4 L of oxygen per minute at baseline   CAP (community acquired pneumonia)- (present on admission) - ongoing LLL consolidation which may be acutely worse on admission; symptomatically he felt worse over the past ~1 week prior to admission -  Completed abx course - cultures had remained neg -On baseline oxygen requirement   Non-small cell carcinoma of left lung, stage 4 ([place])- (present on admission) - follows outpatient with Dr. [name] -Palliative Care consulted and following. Pt has improved markedly thus far and pt is keen on improving further -Pt has not ruled out possibility of pursuing chemo moving forward   Hyponatremia- (present on admission) - appeared clinically volume depleted on admission - unfortunately edema was a big issue in LE; given hypoalbuminemia he is likely 3rd spacing. FeNa calculated <<1% on 11/06/21 consistent with intravascular volume depletion and worsening hyponatremia at that time - pt is s/p albumin - Na had improved with albumin and lasix -Sodium level is stable now   OSA on CPAP - continue nightly CPAP   GERD (gastroesophageal reflux disease)- (present on admission) - continue PPI   Dyslipidemia- (present on admission) - Continue Lipitor   Benign essential HTN- (present on admission) - BP remains stable and controlled at this time - continue monitoring    Coronary artery disease- (present on admission) - Continue Lipitor, BB -No anginal symptoms   Sepsis (HCC)-resolved as of 11/08/2021, (present on admission) - neutropenia, tachycardia, tachypnea, lung source suspected - see pna treatment              Discharge Diagnoses:  Principal Problem:   Atrial fibrillation with RVR ([place]) Active Problems:   Coronary artery disease   Benign essential HTN   Dyslipidemia   GERD (gastroesophageal reflux disease)   OSA on CPAP   Hyponatremia   Non-small cell carcinoma of left lung, stage 4 (HCC)   CAP (community acquired pneumonia)   COPD with acute exacerbation ([place])   Physical deconditioning   Leukopenia   Macrocytic anemia   Prolonged Q-T interval on ECG   Stasis dermatitis   Dysarthria   Pressure injury of skin    Discharge Instructions  Discharge Instructions     Amb  referral to AFIB Clinic   Complete by: As directed    Diet - low sodium heart healthy   Complete by: As directed    Discharge instructions   Complete by: As directed    1)Please take prescribed medications as instructed 2)Follow up with cardiology as an outpatient. 3)Do a CBC, BMP tests in a week   Increase activity slowly   Complete by: As directed    No wound care   Complete by: As directed       Allergies as of 11/20/2021       Reactions   Amoxicillin Anaphylaxis, Swelling   Tolerates Rocephin   Fosinopril Other (See Comments), Cough   Muscle aches   Hydromorphone Nausea And Vomiting   Ampicillin Rash   Codeine Nausea Only, Other (See Comments)   Heavy amounts cause nausea and stomach upset   Monopril [fosinopril Sodium] Other (See Comments)   Muscle aches & pain   Rosuvastatin Other (See Comments)   Joint pain        Medication List     STOP taking these medications    chlorpheniramine-HYDROcodone 10-8 MG/5ML Suer Commonly known as: TUSSIONEX   dofetilide 500 MCG capsule Commonly known as: TIKOSYN   traZODone 50 MG tablet Commonly known as: DESYREL  TAKE these medications    acetaminophen 325 MG tablet Commonly known as: TYLENOL Take 2 tablets (650 mg total) by mouth every 6 (six) hours as needed for mild pain (or Fever >/= 101).   albuterol (2.5 MG/3ML) 0.083% nebulizer solution Commonly known as: PROVENTIL Inhale 3 mLs into the lungs every 4 (four) hours as needed for wheezing or shortness of breath. What changed:  when to take this additional instructions   ALPRAZolam 0.5 MG tablet Commonly known as: XANAX Take 1 tablet (0.5 mg total) by mouth at bedtime as needed for anxiety. What changed: reasons to take this   amiodarone 200 MG tablet Commonly known as: PACERONE Take 1 tablet (200 mg total) by mouth daily for 6 days. Start taking on: November 21, 2021   atorvastatin 40 MG tablet Commonly known as: LIPITOR Take 1 tablet (40 mg  total) by mouth daily. What changed: when to take this   benzonatate 200 MG capsule Commonly known as: TESSALON Take 1 capsule (200 mg total) by mouth 3 (three) times daily. What changed: when to take this   budesonide 0.5 MG/2ML nebulizer solution Commonly known as: PULMICORT Take 2 mLs (0.5 mg total) by nebulization 2 (two) times daily. What changed: when to take this   carbidopa-levodopa 25-100 MG tablet Commonly known as: SINEMET IR Take 1 tablet by mouth 3 (three) times daily. What changed:  when to take this additional instructions   diltiazem 120 MG 24 hr capsule Commonly known as: CARDIZEM CD Take 1 capsule (120 mg total) by mouth daily. Start taking on: November 21, 2021   fenofibrate 160 MG tablet TAKE 1 TABLET DAILY. PLEASE KEEP UPCOMING APPT IN MARCH WITH DR. Acie Fredrickson BEFORE ANYMORE REFILLS. What changed:  how much to take how to take this when to take this additional instructions   fluticasone 50 MCG/ACT nasal spray Commonly known as: FLONASE Place 2 sprays into both nostrils daily.   folic acid 1 MG tablet Commonly known as: FOLVITE Take 1 tablet (1 mg total) by mouth daily.   guaiFENesin 600 MG 12 hr tablet Commonly known as: MUCINEX Take 2 tablets (1,200 mg total) by mouth 2 (two) times daily.   ipratropium 0.02 % nebulizer solution Commonly known as: ATROVENT Take 2.5 mLs (0.5 mg total) by nebulization 2 (two) times daily.   ipratropium 0.03 % nasal spray Commonly known as: ATROVENT Place 1-2 sprays into both nostrils every 12 (twelve) hours. For drainage What changed:  how much to take when to take this additional instructions   levalbuterol 0.63 MG/3ML nebulizer solution Commonly known as: XOPENEX Take 3 mLs (0.63 mg total) by nebulization 2 (two) times daily.   loratadine 10 MG tablet Commonly known as: CLARITIN Take 1 tablet (10 mg total) by mouth daily as needed for allergies (nasal drainage). What changed: when to take this    magnesium oxide 400 (240 Mg) MG tablet Commonly known as: MAG-OX Take 1 tablet (400 mg total) by mouth daily.   metoprolol succinate 25 MG 24 hr tablet Commonly known as: TOPROL-XL Take 1 tablet (25 mg total) by mouth daily.   multivitamins ther. w/minerals Tabs tablet Take 1 tablet by mouth daily with breakfast.   nitroGLYCERIN 0.4 MG SL tablet Commonly known as: NITROSTAT Place 1 tablet (0.4 mg total) under the tongue every 5 (five) minutes as needed for chest pain.   omeprazole 20 MG capsule Commonly known as: PRILOSEC Take 1 capsule (20 mg total) by mouth daily. What changed: when to take this  ondansetron 8 MG tablet Commonly known as: ZOFRAN Take 8 mg by mouth every 8 (eight) hours as needed for nausea or vomiting.   polyethylene glycol 17 g packet Commonly known as: MIRALAX / GLYCOLAX Take 17 g by mouth 2 (two) times daily. What changed: when to take this   potassium chloride 10 MEQ tablet Commonly known as: KLOR-CON TAKE 1 TABLET BY MOUTH EVERY DAY What changed:  when to take this additional instructions   predniSONE 10 MG tablet Commonly known as: DELTASONE Take 3 tablets (30 mg total) by mouth daily with breakfast for 3 days. Start taking on: November 21, 2021   predniSONE 20 MG tablet Commonly known as: DELTASONE Take 1 tablet (20 mg total) by mouth daily with breakfast for 3 days. Start taking on: November 24, 2021   predniSONE 10 MG tablet Commonly known as: DELTASONE Take 1 tablet (10 mg total) by mouth daily with breakfast for 3 days. Start taking on: December 01, 2021   predniSONE 5 MG tablet Commonly known as: DELTASONE Take 1 tablet (5 mg total) by mouth daily with breakfast for 3 days. Start taking on: December 08, 2021   Probiotic 250 MG Caps Take 250 mg by mouth daily.   rivaroxaban 20 MG Tabs tablet Commonly known as: Xarelto Take 1 tablet (20 mg total) by mouth daily. What changed: when to take this   RyVent 6 MG Tabs Generic  drug: Carbinoxamine Maleate TAKE 1 TABLET BY MOUTH 2 (TWO) TIMES DAILY AS NEEDED (DRAINAGE). What changed: See the new instructions.   senna-docusate 8.6-50 MG tablet Commonly known as: Senokot-S Take 1 tablet by mouth 2 (two) times daily.   Stiolto Respimat 2.5-2.5 MCG/ACT Aers Generic drug: Tiotropium Bromide-Olodaterol Inhale 2 puffs into the lungs daily.   tamsulosin 0.4 MG Caps capsule Commonly known as: FLOMAX Take 1 capsule (0.4 mg total) by mouth daily after supper.   topiramate 50 MG tablet Commonly known as: TOPAMAX Take 2 tablets (100 mg total) by mouth 2 (two) times daily.   traMADol 50 MG tablet Commonly known as: ULTRAM Take 1 tablet (50 mg total) by mouth every 6 (six) hours as needed for moderate pain.   Vitamin D (Ergocalciferol) 1.25 MG (50000 UNIT) Caps capsule Commonly known as: DRISDOL Take 1 capsule (50,000 Units total) by mouth every 7 (seven) days. Monday What changed:  when to take this additional instructions        Contact information for follow-up providers     Parrett, Fonnie Mu, NP Follow up on 11/26/2021.   Specialty: Pulmonary Disease Why: Follow up appointment at 11:30am Contact information: [place] [address] [phone]         Malka So R, PA Follow up.   Specialty: Cardiology Why: the a fib clinic should call and arrange a follow up visit.  if you have not heard by Wed please call the office  thanks. Contact information: [place] [address] [phone]              Contact information for after-discharge care     Destination     [facility] Preferred SNF .   Service: Skilled Nursing Contact information: [address] [address] [phone]                    Allergies  Allergen Reactions   Amoxicillin Anaphylaxis and Swelling    Tolerates Rocephin   Fosinopril Other (See Comments)  and Cough    Muscle aches    Hydromorphone Nausea And Vomiting   Ampicillin Rash   Codeine Nausea Only and Other (See Comments)    Heavy amounts cause nausea and stomach upset   Monopril [Fosinopril Sodium] Other (See Comments)    Muscle aches & pain   Rosuvastatin Other (See Comments)    Joint pain    Consultations: Cardiology   Procedures/Studies: DG Chest 2 View  Result Date: 11/03/2021 CLINICAL DATA:  Cough EXAM: CHEST - 2 VIEW COMPARISON:  CT chest 10/15/2021 FINDINGS: Cardiomegaly and mediastinum appear unchanged. Calcified plaques in the aortic arch. Left-sided atrial loop recorder. Right-sided central venous port with the tip in the region of the SVC. Heart and mediastinum are deviated to the left similar to previous. Extensive irregular opacities throughout the left lung again seen with relative sparing in the upper lung zone, similar to previous. Chronic changes in the right lung base. No significant pleural effusion. No pneumothorax visualized. IMPRESSION: No significant change since previous study including extensive irregular opacities in the left lung. Electronically Signed   By: Ofilia Neas M.D.   On: 11/03/2021 10:55   MR BRAIN W WO CONTRAST  Result Date: 11/11/2021 CLINICAL DATA:  Follow-up examination for stroke. EXAM: MRI HEAD WITHOUT AND WITH CONTRAST TECHNIQUE: Multiplanar, multiecho pulse sequences of the brain and surrounding structures were obtained without and with intravenous contrast. CONTRAST:  29mL GADAVIST GADOBUTROL 1 MMOL/ML IV SOLN COMPARISON:  Prior CT from earlier the same day. FINDINGS: Brain: Examination moderately degraded by motion artifact. Generalized age-related cerebral atrophy. Patchy and confluent T2/FLAIR hyperintensity involving the periventricular deep white matter both cerebral hemispheres most consistent with chronic small vessel ischemic disease, mild to moderate in nature. Area of encephalomalacia and gliosis involving the left parietal lobe, consistent with a chronic  ischemic infarct, posterior left MCA distribution. No abnormal foci of restricted diffusion to suggest acute or subacute ischemia. Gray-white matter differentiation otherwise maintained. No other areas of chronic cortical infarction. No visible acute or chronic intracranial blood products. No mass lesion, midline shift or mass effect. Mild ventricular prominence related to global parenchymal volume loss without hydrocephalus. No extra-axial fluid collection. Partially empty sella noted. Midline structures intact. No visible abnormal enhancement. Vascular: Major intracranial vascular flow voids are maintained. Skull and upper cervical spine: Craniocervical junction within normal limits. Bone marrow signal intensity grossly normal. No scalp soft tissue abnormality. Sinuses/Orbits: Globes and orbital soft tissues demonstrate no acute finding. Mild mucosal thickening noted within the ethmoidal air cells. Paranasal sinuses are otherwise clear. Trace right mastoid effusion noted, of doubtful significance. Other: None. IMPRESSION: 1. No acute intracranial infarct or other abnormality. 2. Chronic left parietal infarct. 3. Underlying mild to moderate chronic microvascular ischemic disease. Electronically Signed   By: Jeannine Boga M.D.   On: 11/11/2021 00:32   CUP PACEART REMOTE DEVICE CHECK  Result Date: 10/26/2021 ILR summary report received. Battery status OK. Normal device function. No new symptom, tachy, brady, or pause episodes. No new AF episodes. Monthly summary reports and ROV/PRN LA  CT HEAD CODE STROKE WO CONTRAST  Result Date: 11/10/2021 CLINICAL DATA:  Code stroke. Initial evaluation for neuro deficit, stroke suspected. EXAM: CT HEAD WITHOUT CONTRAST TECHNIQUE: Contiguous axial images were obtained from the base of the skull through the vertex without intravenous contrast. RADIATION DOSE REDUCTION: This exam was performed according to the departmental dose-optimization program which includes  automated exposure control, adjustment of the mA and/or kV according to patient size and/or use of  iterative reconstruction technique. COMPARISON:  CT from 07/06/2012. FINDINGS: Brain: Generalized age-related cerebral atrophy with chronic microvascular ischemic disease. Remote posterior left MCA distribution infarct involving the left parietal lobe noted. No acute intracranial hemorrhage. No acute large vessel territory infarct. No mass lesion or midline shift. No hydrocephalus or extra-axial fluid collection. Vascular: No hyperdense vessel. Scattered vascular calcifications noted within the carotid siphons. Skull: Scalp soft tissues and calvarium within normal limits. Sinuses/Orbits: Globes and orbital soft tissues demonstrate no acute finding. Paranasal sinuses and mastoid air cells are clear. Other: None. ASPECTS [facility] Stroke Program Early CT Score) - Ganglionic level infarction (caudate, lentiform nuclei, internal capsule, insula, M1-M3 cortex): 7 - Supraganglionic infarction (M4-M6 cortex): 3 Total score (0-10 with 10 being normal): 10 IMPRESSION: 1. No acute intracranial abnormality. 2. ASPECTS is 10. 3. Chronic left parietal lobe infarct, posterior left MCA distribution. 4. Underlying age-related cerebral atrophy with chronic microvascular ischemic disease. These results were communicated to Dr. Sabino Gasser At 12:12 am on 11/10/2021 by text page via the [facility] messaging system. Electronically Signed   By: Jeannine Boga M.D.   On: 11/10/2021 00:13   VAS [place] LOWER EXTREMITY VENOUS (DVT)  Result Date: 11/07/2021  Lower Venous DVT Study Patient Name:  Corey [name]  Date of Exam:   11/06/2021 Medical Rec #: [id number]    Accession #:    [id number] Date of Birth: 02/28/1946   Patient Gender: M Patient Age:   37 years Exam Location:  [facility] Procedure:      VAS [place] LOWER EXTREMITY VENOUS (DVT) Referring Phys: Dwyane Dee --------------------------------------------------------------------------------   Indications: Swelling - LLE.  Risk Factors: CA patient on chemotherapy. Anticoagulation: Xarelto. Comparison Study: Previous exam on 11/03/2021 was negative for DVT Performing Technologist: Rogelia Rohrer RVT, RDMS  Examination Guidelines: A complete evaluation includes B-mode imaging, spectral Doppler, color Doppler, and power Doppler as needed of all accessible portions of each vessel. Bilateral testing is considered an integral part of a complete examination. Limited examinations for reoccurring indications may be performed as noted. The reflux portion of the exam is performed with the patient in reverse Trendelenburg.  +---------+---------------+---------+-----------+----------+--------------+  RIGHT     Compressibility Phasicity Spontaneity Properties Thrombus Aging  +---------+---------------+---------+-----------+----------+--------------+  CFV       Full            Yes       Yes                                    +---------+---------------+---------+-----------+----------+--------------+  SFJ       Full                                                             +---------+---------------+---------+-----------+----------+--------------+  FV Prox   Full            Yes       Yes                                    +---------+---------------+---------+-----------+----------+--------------+  FV Mid    Full            Yes  Yes                                    +---------+---------------+---------+-----------+----------+--------------+  FV Distal Full            Yes       Yes                                    +---------+---------------+---------+-----------+----------+--------------+  PFV       Full                                                             +---------+---------------+---------+-----------+----------+--------------+  POP       Full            Yes       Yes                                    +---------+---------------+---------+-----------+----------+--------------+  PTV       Full                                                              +---------+---------------+---------+-----------+----------+--------------+  PERO      Full                                                             +---------+---------------+---------+-----------+----------+--------------+   +---------+---------------+---------+-----------+----------+--------------+  LEFT      Compressibility Phasicity Spontaneity Properties Thrombus Aging  +---------+---------------+---------+-----------+----------+--------------+  CFV       Full            Yes       Yes                                    +---------+---------------+---------+-----------+----------+--------------+  SFJ       Full                                                             +---------+---------------+---------+-----------+----------+--------------+  FV Prox   Full            Yes       Yes                                    +---------+---------------+---------+-----------+----------+--------------+  FV Mid    Full  Yes       Yes                                    +---------+---------------+---------+-----------+----------+--------------+  FV Distal Full            Yes       Yes                                    +---------+---------------+---------+-----------+----------+--------------+  PFV       Full                                                             +---------+---------------+---------+-----------+----------+--------------+  POP       Full            Yes       Yes                                    +---------+---------------+---------+-----------+----------+--------------+  PTV       Full                                                             +---------+---------------+---------+-----------+----------+--------------+  PERO      Full                                                             +---------+---------------+---------+-----------+----------+--------------+     Summary: BILATERAL: - No evidence of deep vein thrombosis seen in the lower  extremities, bilaterally. - No evidence of superficial venous thrombosis in the lower extremities, bilaterally. -No evidence of popliteal cyst, bilaterally.   *See table(s) above for measurements and observations. Electronically signed by Harold Barban MD on 11/07/2021 at 5:28:58 PM.    Final    VAS [place] LOWER EXTREMITY VENOUS (DVT) (7a-7p)  Result Date: 11/03/2021  Lower Venous DVT Study Patient Name:  ERLE GUSTER  Date of Exam:   11/03/2021 Medical Rec #: [id number]    Accession #:    [id number] Date of Birth: 12-30-1945   Patient Gender: M Patient Age:   43 years Exam Location:  [facility] Procedure:      VAS [place] LOWER EXTREMITY VENOUS (DVT) Referring Phys: Thelma Comp NANAVATI --------------------------------------------------------------------------------  Indications: Pain.  Risk Factors: None identified. Comparison Study: No prior studies. Performing Technologist: Oliver Hum RVT  Examination Guidelines: A complete evaluation includes B-mode imaging, spectral Doppler, color Doppler, and power Doppler as needed of all accessible portions of each vessel. Bilateral testing is considered an integral part of a complete examination. Limited examinations for reoccurring indications may be performed as noted. The reflux portion of the exam is performed with the patient in reverse Trendelenburg.  +-----+---------------+---------+-----------+----------+--------------+  RIGHT Compressibility Phasicity Spontaneity Properties Thrombus Aging  +-----+---------------+---------+-----------+----------+--------------+  CFV   Full            Yes       Yes                                    +-----+---------------+---------+-----------+----------+--------------+   +---------+---------------+---------+-----------+----------+--------------+  LEFT      Compressibility Phasicity Spontaneity Properties Thrombus Aging  +---------+---------------+---------+-----------+----------+--------------+  CFV       Full            Yes        Yes                                    +---------+---------------+---------+-----------+----------+--------------+  SFJ       Full                                                             +---------+---------------+---------+-----------+----------+--------------+  FV Prox   Full                                                             +---------+---------------+---------+-----------+----------+--------------+  FV Mid    Full                                                             +---------+---------------+---------+-----------+----------+--------------+  FV Distal Full                                                             +---------+---------------+---------+-----------+----------+--------------+  PFV       Full                                                             +---------+---------------+---------+-----------+----------+--------------+  POP       Full            Yes       Yes                                    +---------+---------------+---------+-----------+----------+--------------+  PTV       Full                                                             +---------+---------------+---------+-----------+----------+--------------+  PERO      Full                                                             +---------+---------------+---------+-----------+----------+--------------+    Summary: RIGHT: - No evidence of common femoral vein obstruction.  LEFT: - There is no evidence of deep vein thrombosis in the lower extremity.  - No cystic structure found in the popliteal fossa.  *See table(s) above for measurements and observations. Electronically signed by Deitra Mayo MD on 11/03/2021 at 3:47:23 PM.    Final    CT ANGIO HEAD NECK W WO CM W PERF (CODE STROKE)  Result Date: 11/10/2021 CLINICAL DATA:  Initial evaluation for neuro deficit, stroke. EXAM: CT ANGIOGRAPHY HEAD AND NECK TECHNIQUE: Multidetector CT imaging of the head and neck was performed using the standard protocol  during bolus administration of intravenous contrast. Multiplanar CT image reconstructions and MIPs were obtained to evaluate the vascular anatomy. Carotid stenosis measurements (when applicable) are obtained utilizing NASCET criteria, using the distal internal carotid diameter as the denominator. RADIATION DOSE REDUCTION: This exam was performed according to the departmental dose-optimization program which includes automated exposure control, adjustment of the mA and/or kV according to patient size and/or use of iterative reconstruction technique. CONTRAST:  168mL OMNIPAQUE IOHEXOL 350 MG/ML SOLN COMPARISON:  Prior head CT from earlier the same day. FINDINGS: CTA NECK FINDINGS Aortic arch: Visualized aortic arch normal in caliber with normal branch pattern. Mild-to-moderate atheromatous change about the arch and origin of the great vessels without hemodynamically significant stenosis. Right carotid system: Right CCA patent from its origin to the bifurcation without stenosis. Moderate atheromatous plaque about the right carotid bulb/proximal right ICA without significant stenosis. Right ICA patent distally without stenosis or dissection. Left carotid system: Left CCA patent from its origin to the bifurcation without stenosis. Prominent atheromatous plaque about the left carotid bulb/proximal left ICA with associated stenosis of up to approximately 70-80% by NASCET criteria (series 9, image 210). Exact measurements difficult given motion and streak artifact through this region. Left ICA tortuous but otherwise patent distally to the skull base. Vertebral arteries: Both vertebral arteries arise from the subclavian arteries. No significant proximal subclavian artery stenosis. Atheromatous change about the origins and proximal V1 segments bilaterally with associated moderate up to approximate 50% stenoses. Vertebral arteries otherwise patent distally without stenosis or dissection. Skeleton: No discrete or worrisome  osseous lesions. Prior fusion at C5-C6. Other neck: No other acute soft tissue abnormality within the neck. Upper chest: Question possible filling defects involving the left main pulmonary artery, which could reflect acute pulmonary embolus (series 9, image 396). Finding not entirely certain given timing of the contrast bolus on this exam. Consolidative opacity noted within the visualized left lung similar to previous. Bilateral pleural effusions partially visualized. Degree of underlying pulmonary interstitial edema noted. Review of the MIP images confirms the above findings CTA HEAD FINDINGS Anterior circulation: Examination somewhat limited by timing the contrast bolus. Petrous segments patent bilaterally. Atheromatous change within the carotid siphons with associated mild to moderate multifocal narrowing. A1 segments patent bilaterally. Normal anterior communicating artery complex. Anterior cerebral arteries perfused to their distal aspects without visible stenosis. No M1 stenosis or occlusion. Normal MCA bifurcations. Distal MCA branches perfused and symmetric. Posterior circulation: Both V4 segments patent without  stenosis. Left PICA patent. Right PICA not definitely seen. Basilar patent to its distal aspect without stenosis. Superior cerebellar arteries patent bilaterally. Both PCAs primarily supplied via the basilar well perfused or distal aspects. Venous sinuses: Patent allowing for timing the contrast bolus. Anatomic variants: None significant.  No visible aneurysm. Review of the MIP images confirms the above findings IMPRESSION: 1. Negative CTA for large vessel occlusion. 2. 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 3. Atheromatous change about the origins of both vertebral arteries with associated moderate multifocal stenoses. 4. Question filling defects involving the left main pulmonary artery, suspicious  for acute pulmonary emboli. Further evaluation with dedicated cross-sectional imaging of the chest recommended. 5. Consolidative opacity within the visualized left lung, similar to prior CT from 10/15/2021. 6. Layering bilateral pleural effusions with underlying pulmonary interstitial edema, partially visualized. Critical Value/emergent results were called by telephone at the time of interpretation on 11/10/2021 at 3:23 am to the Nurse practitioner taking care of the patient, Quinn Plowman , who verbally acknowledged these results. Electronically Signed   By: Jeannine Boga M.D.   On: 11/10/2021 03:24      Subjective: Patient seen and examined at the bedside this morning.  Comfortable. no new complaints.  On baseline oxygen requirement.  Hemodynamically stable for discharge  Discharge Exam: Vitals:   11/20/21 0730 11/20/21 0938  BP:  100/65  Pulse:  81  Resp:    Temp:    SpO2: 96%    Vitals:   11/20/21 0338 11/20/21 0729 11/20/21 0730 11/20/21 0938  BP: 109/76   100/65  Pulse: 90   81  Resp: 18     Temp: 98 F (36.7 C)     TempSrc: Oral     SpO2: 93% 94% 96%   Weight:      Height:        General: Pt is alert, awake, not in acute distress, deconditioned, chronically ill looking Cardiovascular: RRR, S1/S2 +, no rubs, no gallops Respiratory: CTA bilaterally, no wheezing, no rhonchi Abdominal: Soft, NT, ND, bowel sounds + Extremities: no edema, no cyanosis    The results of significant diagnostics from this hospitalization (including imaging, microbiology, ancillary and laboratory) are listed below for reference.     Microbiology: No results found for this or any previous visit (from the past 240 hour(s)).   Labs: BNP (last 3 results) Recent Labs    08/01/21 1630 08/03/21 0315 11/03/21 1208  BNP 94.3 34.3 409.8*   Basic Metabolic Panel: Recent Labs  Lab 11/14/21 0505 11/15/21 0501 11/16/21 0456 11/17/21 0359 11/18/21 0329  NA 133* 133* 133* 133* 135  K 4.0  3.8 4.2 3.8 4.0  CL 100 99 100 101 100  CO2 28 28 27 26 27   GLUCOSE 100* 94 109* 111* 84  BUN 19 20 22  25* 25*  CREATININE 0.79 0.66 0.77 0.83 0.71  CALCIUM 8.8* 8.8* 8.6* 8.7* 8.7*  MG 1.9  --   --   --   --    Liver Function Tests: Recent Labs  Lab 11/15/21 0501 11/16/21 0456 11/17/21 0359 11/18/21 0329  AST 25 22 19 21   ALT 9 6 12 12   ALKPHOS 59 43 65 63  BILITOT 0.2* 0.1* 0.2* 0.2*  PROT 6.1* 5.2* 5.7* 5.9*  ALBUMIN 2.7* 2.5* 2.7* 2.8*   No results for input(s): LIPASE, AMYLASE in the last 168 hours. No results for input(s): AMMONIA in the last  168 hours. CBC: Recent Labs  Lab 11/14/21 0505 11/15/21 0501 11/18/21 0329  WBC 8.1 7.9 9.7  NEUTROABS 5.8 5.8  --   HGB 9.0* 9.0* 8.8*  HCT 28.1* 29.3* 27.9*  MCV 105.6* 107.7* 108.1*  PLT 185 195 232   Cardiac Enzymes: No results for input(s): CKTOTAL, CKMB, CKMBINDEX, TROPONINI in the last 168 hours. BNP: Invalid input(s): POCBNP CBG: Recent Labs  Lab 11/16/21 0740  GLUCAP 100*   D-Dimer No results for input(s): DDIMER in the last 72 hours. Hgb A1c No results for input(s): HGBA1C in the last 72 hours. Lipid Profile No results for input(s): CHOL, HDL, LDLCALC, TRIG, CHOLHDL, LDLDIRECT in the last 72 hours. Thyroid function studies No results for input(s): TSH, T4TOTAL, T3FREE, THYROIDAB in the last 72 hours.  Invalid input(s): FREET3 Anemia work up No results for input(s): VITAMINB12, FOLATE, FERRITIN, TIBC, IRON, RETICCTPCT in the last 72 hours. Urinalysis    Component Value Date/Time   COLORURINE AMBER (A) 11/03/2021 1229   APPEARANCEUR CLEAR 11/03/2021 1229   LABSPEC 1.015 11/03/2021 1229   PHURINE 5.0 11/03/2021 1229   GLUCOSEU NEGATIVE 11/03/2021 1229   HGBUR NEGATIVE 11/03/2021 1229   BILIRUBINUR NEGATIVE 11/03/2021 1229   KETONESUR NEGATIVE 11/03/2021 1229   PROTEINUR NEGATIVE 11/03/2021 1229   NITRITE NEGATIVE 11/03/2021 1229   LEUKOCYTESUR NEGATIVE 11/03/2021 1229   Sepsis Labs Invalid  input(s): PROCALCITONIN,  WBC,  LACTICIDVEN Microbiology No results found for this or any previous visit (from the past 240 hour(s)).  Please note: You were cared for by a hospitalist during your hospital stay. Once you are discharged, your primary care physician will handle any further medical issues. Please note that NO REFILLS for any discharge medications will be authorized once you are discharged, as it is imperative that you return to your primary care physician (or establish a relationship with a primary care physician if you do not have one) for your post hospital discharge needs so that they can reassess your need for medications and monitor your lab values.    Time coordinating discharge: 40 minutes  SIGNED:   Shelly Coss, MD  [facility] 11/20/2021, 10:57 AM Pager [phone]  If 7PM-7AM, please contact night-coverage [email or web address] Password [email or web address]

## 2021-11-21 ENCOUNTER — Other Ambulatory Visit: Payer: Self-pay

## 2021-11-21 ENCOUNTER — Encounter (HOSPITAL_COMMUNITY): Payer: Self-pay | Admitting: Emergency Medicine

## 2021-11-21 ENCOUNTER — Emergency Department (HOSPITAL_COMMUNITY): Payer: Medicare PPO

## 2021-11-21 ENCOUNTER — Inpatient Hospital Stay (HOSPITAL_COMMUNITY)
Admission: EM | Admit: 2021-11-21 | Discharge: 2021-11-26 | DRG: 308 | Disposition: A | Discharge status: Skilled Nursing Facility | Payer: Medicare PPO | Source: Skilled Nursing Facility | Attending: Internal Medicine | Admitting: Internal Medicine

## 2021-11-21 DIAGNOSIS — Z9981 Dependence on supplemental oxygen: Secondary | ICD-10-CM | POA: Diagnosis not present

## 2021-11-21 DIAGNOSIS — Z66 Do not resuscitate: Secondary | ICD-10-CM | POA: Diagnosis present

## 2021-11-21 DIAGNOSIS — Z7901 Long term (current) use of anticoagulants: Secondary | ICD-10-CM | POA: Diagnosis not present

## 2021-11-21 DIAGNOSIS — Z803 Family history of malignant neoplasm of breast: Secondary | ICD-10-CM

## 2021-11-21 DIAGNOSIS — I499 Cardiac arrhythmia, unspecified: Secondary | ICD-10-CM | POA: Diagnosis not present

## 2021-11-21 DIAGNOSIS — R5381 Other malaise: Secondary | ICD-10-CM | POA: Diagnosis present

## 2021-11-21 DIAGNOSIS — C61 Malignant neoplasm of prostate: Secondary | ICD-10-CM | POA: Diagnosis not present

## 2021-11-21 DIAGNOSIS — Z8546 Personal history of malignant neoplasm of prostate: Secondary | ICD-10-CM

## 2021-11-21 DIAGNOSIS — F419 Anxiety disorder, unspecified: Secondary | ICD-10-CM | POA: Diagnosis present

## 2021-11-21 DIAGNOSIS — E440 Moderate protein-calorie malnutrition: Secondary | ICD-10-CM | POA: Diagnosis not present

## 2021-11-21 DIAGNOSIS — G20 Parkinson's disease: Secondary | ICD-10-CM | POA: Diagnosis present

## 2021-11-21 DIAGNOSIS — Z5111 Encounter for antineoplastic chemotherapy: Secondary | ICD-10-CM | POA: Diagnosis not present

## 2021-11-21 DIAGNOSIS — I10 Essential (primary) hypertension: Secondary | ICD-10-CM

## 2021-11-21 DIAGNOSIS — Z96652 Presence of left artificial knee joint: Secondary | ICD-10-CM | POA: Diagnosis present

## 2021-11-21 DIAGNOSIS — Z87891 Personal history of nicotine dependence: Secondary | ICD-10-CM

## 2021-11-21 DIAGNOSIS — I4891 Unspecified atrial fibrillation: Secondary | ICD-10-CM

## 2021-11-21 DIAGNOSIS — Z8673 Personal history of transient ischemic attack (TIA), and cerebral infarction without residual deficits: Secondary | ICD-10-CM

## 2021-11-21 DIAGNOSIS — J441 Chronic obstructive pulmonary disease with (acute) exacerbation: Secondary | ICD-10-CM | POA: Diagnosis not present

## 2021-11-21 DIAGNOSIS — Z20822 Contact with and (suspected) exposure to covid-19: Secondary | ICD-10-CM | POA: Diagnosis present

## 2021-11-21 DIAGNOSIS — Z7952 Long term (current) use of systemic steroids: Secondary | ICD-10-CM

## 2021-11-21 DIAGNOSIS — Z880 Allergy status to penicillin: Secondary | ICD-10-CM

## 2021-11-21 DIAGNOSIS — E785 Hyperlipidemia, unspecified: Secondary | ICD-10-CM | POA: Diagnosis present

## 2021-11-21 DIAGNOSIS — Z9989 Dependence on other enabling machines and devices: Secondary | ICD-10-CM | POA: Diagnosis not present

## 2021-11-21 DIAGNOSIS — R000 Tachycardia, unspecified: Secondary | ICD-10-CM | POA: Diagnosis not present

## 2021-11-21 DIAGNOSIS — J189 Pneumonia, unspecified organism: Secondary | ICD-10-CM | POA: Diagnosis not present

## 2021-11-21 DIAGNOSIS — I480 Paroxysmal atrial fibrillation: Principal | ICD-10-CM | POA: Diagnosis present

## 2021-11-21 DIAGNOSIS — I2510 Atherosclerotic heart disease of native coronary artery without angina pectoris: Secondary | ICD-10-CM | POA: Diagnosis present

## 2021-11-21 DIAGNOSIS — Z515 Encounter for palliative care: Secondary | ICD-10-CM | POA: Diagnosis not present

## 2021-11-21 DIAGNOSIS — C787 Secondary malignant neoplasm of liver and intrahepatic bile duct: Secondary | ICD-10-CM | POA: Diagnosis not present

## 2021-11-21 DIAGNOSIS — Z885 Allergy status to narcotic agent status: Secondary | ICD-10-CM

## 2021-11-21 DIAGNOSIS — Z820 Family history of epilepsy and other diseases of the nervous system: Secondary | ICD-10-CM

## 2021-11-21 DIAGNOSIS — Z801 Family history of malignant neoplasm of trachea, bronchus and lung: Secondary | ICD-10-CM | POA: Diagnosis not present

## 2021-11-21 DIAGNOSIS — G4733 Obstructive sleep apnea (adult) (pediatric): Secondary | ICD-10-CM | POA: Diagnosis present

## 2021-11-21 DIAGNOSIS — C3432 Malignant neoplasm of lower lobe, left bronchus or lung: Secondary | ICD-10-CM | POA: Diagnosis not present

## 2021-11-21 DIAGNOSIS — J439 Emphysema, unspecified: Secondary | ICD-10-CM

## 2021-11-21 DIAGNOSIS — J90 Pleural effusion, not elsewhere classified: Secondary | ICD-10-CM | POA: Diagnosis not present

## 2021-11-21 DIAGNOSIS — Z955 Presence of coronary angioplasty implant and graft: Secondary | ICD-10-CM

## 2021-11-21 DIAGNOSIS — I5032 Chronic diastolic (congestive) heart failure: Secondary | ICD-10-CM | POA: Diagnosis present

## 2021-11-21 DIAGNOSIS — Z79899 Other long term (current) drug therapy: Secondary | ICD-10-CM

## 2021-11-21 DIAGNOSIS — R0602 Shortness of breath: Secondary | ICD-10-CM | POA: Diagnosis not present

## 2021-11-21 DIAGNOSIS — J440 Chronic obstructive pulmonary disease with acute lower respiratory infection: Secondary | ICD-10-CM | POA: Diagnosis not present

## 2021-11-21 DIAGNOSIS — J449 Chronic obstructive pulmonary disease, unspecified: Secondary | ICD-10-CM

## 2021-11-21 DIAGNOSIS — G8929 Other chronic pain: Secondary | ICD-10-CM | POA: Diagnosis present

## 2021-11-21 DIAGNOSIS — D6869 Other thrombophilia: Secondary | ICD-10-CM | POA: Diagnosis not present

## 2021-11-21 DIAGNOSIS — R5383 Other fatigue: Secondary | ICD-10-CM | POA: Diagnosis not present

## 2021-11-21 DIAGNOSIS — Z7401 Bed confinement status: Secondary | ICD-10-CM | POA: Diagnosis not present

## 2021-11-21 DIAGNOSIS — Z743 Need for continuous supervision: Secondary | ICD-10-CM | POA: Diagnosis not present

## 2021-11-21 DIAGNOSIS — Z981 Arthrodesis status: Secondary | ICD-10-CM

## 2021-11-21 DIAGNOSIS — I110 Hypertensive heart disease with heart failure: Secondary | ICD-10-CM | POA: Diagnosis present

## 2021-11-21 DIAGNOSIS — Z821 Family history of blindness and visual loss: Secondary | ICD-10-CM

## 2021-11-21 DIAGNOSIS — M5416 Radiculopathy, lumbar region: Secondary | ICD-10-CM | POA: Diagnosis present

## 2021-11-21 DIAGNOSIS — Z7189 Other specified counseling: Secondary | ICD-10-CM

## 2021-11-21 DIAGNOSIS — I4892 Unspecified atrial flutter: Secondary | ICD-10-CM | POA: Diagnosis present

## 2021-11-21 DIAGNOSIS — Z8249 Family history of ischemic heart disease and other diseases of the circulatory system: Secondary | ICD-10-CM

## 2021-11-21 DIAGNOSIS — C3492 Malignant neoplasm of unspecified part of left bronchus or lung: Secondary | ICD-10-CM | POA: Diagnosis not present

## 2021-11-21 DIAGNOSIS — E669 Obesity, unspecified: Secondary | ICD-10-CM | POA: Diagnosis present

## 2021-11-21 DIAGNOSIS — K219 Gastro-esophageal reflux disease without esophagitis: Secondary | ICD-10-CM | POA: Diagnosis present

## 2021-11-21 DIAGNOSIS — Z888 Allergy status to other drugs, medicaments and biological substances status: Secondary | ICD-10-CM

## 2021-11-21 DIAGNOSIS — J9611 Chronic respiratory failure with hypoxia: Secondary | ICD-10-CM | POA: Diagnosis present

## 2021-11-21 DIAGNOSIS — Z8042 Family history of malignant neoplasm of prostate: Secondary | ICD-10-CM | POA: Diagnosis not present

## 2021-11-21 DIAGNOSIS — R0902 Hypoxemia: Secondary | ICD-10-CM | POA: Diagnosis not present

## 2021-11-21 DIAGNOSIS — J9621 Acute and chronic respiratory failure with hypoxia: Secondary | ICD-10-CM | POA: Diagnosis not present

## 2021-11-21 LAB — RESP PANEL BY RT-PCR (FLU A&B, COVID) ARPGX2
Influenza A by PCR: NEGATIVE
Influenza B by PCR: NEGATIVE
SARS Coronavirus 2 by RT PCR: NEGATIVE

## 2021-11-21 LAB — RESPIRATORY PANEL BY PCR

## 2021-11-21 LAB — CBC WITH DIFFERENTIAL/PLATELET
Abs Immature Granulocytes: 0.73 10*3/uL — ABNORMAL HIGH (ref 0.00–0.07)
Basophils Absolute: 0.1 10*3/uL (ref 0.0–0.1)
Basophils Relative: 1 %
Eosinophils Absolute: 0.1 10*3/uL (ref 0.0–0.5)
Eosinophils Relative: 1 %
HCT: 30.1 % — ABNORMAL LOW (ref 39.0–52.0)
Hemoglobin: 9.4 g/dL — ABNORMAL LOW (ref 13.0–17.0)
Immature Granulocytes: 6 %
Lymphocytes Relative: 6 %
Lymphs Abs: 0.7 10*3/uL (ref 0.7–4.0)
MCH: 34.1 pg — ABNORMAL HIGH (ref 26.0–34.0)
MCHC: 31.2 g/dL (ref 30.0–36.0)
MCV: 109.1 fL — ABNORMAL HIGH (ref 80.0–100.0)
Monocytes Absolute: 1.3 10*3/uL — ABNORMAL HIGH (ref 0.1–1.0)
Monocytes Relative: 11 %
Neutro Abs: 8.7 10*3/uL — ABNORMAL HIGH (ref 1.7–7.7)
Neutrophils Relative %: 75 %
Platelets: 266 10*3/uL (ref 150–400)
RBC: 2.76 MIL/uL — ABNORMAL LOW (ref 4.22–5.81)
RDW: 18 % — ABNORMAL HIGH (ref 11.5–15.5)
WBC: 11.6 10*3/uL — ABNORMAL HIGH (ref 4.0–10.5)
nRBC: 0 % (ref 0.0–0.2)

## 2021-11-21 LAB — COMPREHENSIVE METABOLIC PANEL
ALT: 7 U/L (ref 0–44)
AST: 18 U/L (ref 15–41)
Albumin: 3.1 g/dL — ABNORMAL LOW (ref 3.5–5.0)
Alkaline Phosphatase: 56 U/L (ref 38–126)
Anion gap: 7 (ref 5–15)
BUN: 24 mg/dL — ABNORMAL HIGH (ref 8–23)
CO2: 26 mmol/L (ref 22–32)
Calcium: 9 mg/dL (ref 8.9–10.3)
Chloride: 100 mmol/L (ref 98–111)
Creatinine, Ser: 0.78 mg/dL (ref 0.61–1.24)
GFR, Estimated: >60 mL/min (ref >60)
Glucose, Bld: 103 mg/dL — ABNORMAL HIGH (ref 70–99)
Potassium: 3.9 mmol/L (ref 3.5–5.1)
Sodium: 133 mmol/L — ABNORMAL LOW (ref 135–145)
Total Bilirubin: 0.3 mg/dL (ref 0.3–1.2)
Total Protein: 6.6 g/dL (ref 6.5–8.1)

## 2021-11-21 LAB — BRAIN NATRIURETIC PEPTIDE: B Natriuretic Peptide: 161 pg/mL — ABNORMAL HIGH (ref 0.0–100.0)

## 2021-11-21 LAB — TROPONIN I (HIGH SENSITIVITY)
Troponin I (High Sensitivity): 10 ng/L (ref <18)
Troponin I (High Sensitivity): 8 ng/L (ref <18)

## 2021-11-21 LAB — TSH: TSH: 1.572 u[IU]/mL (ref 0.350–4.500)

## 2021-11-21 LAB — MAGNESIUM: Magnesium: 1.8 mg/dL (ref 1.7–2.4)

## 2021-11-21 MED ORDER — BENZONATATE 100 MG PO CAPS
200.0000 mg | ORAL_CAPSULE | Freq: Three times a day (TID) | ORAL | Status: DC
Start: 2021-11-21 — End: 2021-11-26
  Administered 2021-11-21 – 2021-11-26 (×15): 200 mg via ORAL
  Filled 2021-11-21 (×15): qty 2

## 2021-11-21 MED ORDER — FENOFIBRATE 160 MG PO TABS
160.0000 mg | ORAL_TABLET | Freq: Every day | ORAL | Status: DC
  Administered 2021-11-21 – 2021-11-25 (×5): 160 mg via ORAL
  Filled 2021-11-21 (×6): qty 1

## 2021-11-21 MED ORDER — DOXYCYCLINE HYCLATE 100 MG PO TABS: 100.0000 mg | ORAL_TABLET | Freq: Once | ORAL | Status: DC

## 2021-11-21 MED ORDER — PREDNISONE 20 MG PO TABS
30.0000 mg | ORAL_TABLET | Freq: Every day | ORAL | Status: DC
  Administered 2021-11-21 – 2021-11-26 (×6): 30 mg via ORAL
  Filled 2021-11-21: qty 1
  Filled 2021-11-21: qty 2
  Filled 2021-11-21 (×4): qty 1

## 2021-11-21 MED ORDER — ATORVASTATIN CALCIUM 40 MG PO TABS
40.0000 mg | ORAL_TABLET | Freq: Every day | ORAL | Status: DC
  Administered 2021-11-21: 40 mg via ORAL
  Filled 2021-11-21: qty 1

## 2021-11-21 MED ORDER — METOPROLOL TARTRATE 5 MG/5ML IV SOLN
5.0000 mg | Freq: Once | INTRAVENOUS | Status: AC
  Administered 2021-11-21: 5 mg via INTRAVENOUS
  Filled 2021-11-21: qty 5

## 2021-11-21 MED ORDER — DILTIAZEM HCL ER COATED BEADS 120 MG PO CP24
120.0000 mg | ORAL_CAPSULE | Freq: Every day | ORAL | Status: DC
  Administered 2021-11-21: 120 mg via ORAL
  Filled 2021-11-21: qty 1

## 2021-11-21 MED ORDER — POTASSIUM CHLORIDE ER 10 MEQ PO TBCR
10.0000 meq | EXTENDED_RELEASE_TABLET | Freq: Every day | ORAL | Status: DC
  Administered 2021-11-21 – 2021-11-25 (×5): 10 meq via ORAL
  Filled 2021-11-21 (×11): qty 1

## 2021-11-21 MED ORDER — DILTIAZEM HCL 30 MG PO TABS
120.0000 mg | ORAL_TABLET | Freq: Once | ORAL | Status: AC
Start: 2021-11-21 — End: 2021-11-21
  Administered 2021-11-21: 120 mg via ORAL
  Filled 2021-11-21: qty 4

## 2021-11-21 MED ORDER — PANTOPRAZOLE SODIUM 40 MG PO TBEC
40.0000 mg | DELAYED_RELEASE_TABLET | Freq: Every day | ORAL | Status: DC
  Administered 2021-11-21 – 2021-11-26 (×6): 40 mg via ORAL
  Filled 2021-11-21 (×6): qty 1

## 2021-11-21 MED ORDER — LEVALBUTEROL HCL 0.63 MG/3ML IN NEBU
0.6300 mg | INHALATION_SOLUTION | Freq: Two times a day (BID) | RESPIRATORY_TRACT | Status: DC
  Administered 2021-11-21 – 2021-11-26 (×11): 0.63 mg via RESPIRATORY_TRACT
  Filled 2021-11-21 (×11): qty 3

## 2021-11-21 MED ORDER — POLYETHYLENE GLYCOL 3350 17 G PO PACK
17.0000 g | PACK | Freq: Two times a day (BID) | ORAL | Status: DC
  Administered 2021-11-21 – 2021-11-26 (×10): 17 g via ORAL
  Filled 2021-11-21 (×10): qty 1

## 2021-11-21 MED ORDER — FOLIC ACID 1 MG PO TABS
1.0000 mg | ORAL_TABLET | Freq: Every day | ORAL | Status: DC
  Administered 2021-11-21 – 2021-11-26 (×6): 1 mg via ORAL
  Filled 2021-11-21 (×6): qty 1

## 2021-11-21 MED ORDER — DILTIAZEM HCL ER COATED BEADS 120 MG PO CP24: 120.0000 mg | ORAL_CAPSULE | Freq: Every day | ORAL | Status: DC

## 2021-11-21 MED ORDER — SACCHAROMYCES BOULARDII 250 MG PO CAPS
250.0000 mg | ORAL_CAPSULE | Freq: Every day | ORAL | Status: DC
  Administered 2021-11-21 – 2021-11-26 (×6): 250 mg via ORAL
  Filled 2021-11-21 (×6): qty 1

## 2021-11-21 MED ORDER — BUDESONIDE 0.5 MG/2ML IN SUSP
0.5000 mg | Freq: Two times a day (BID) | RESPIRATORY_TRACT | Status: DC
  Administered 2021-11-21 – 2021-11-26 (×11): 0.5 mg via RESPIRATORY_TRACT
  Filled 2021-11-21 (×11): qty 2

## 2021-11-21 MED ORDER — ALBUTEROL SULFATE (2.5 MG/3ML) 0.083% IN NEBU: 3.0000 mL | INHALATION_SOLUTION | RESPIRATORY_TRACT | Status: DC | PRN

## 2021-11-21 MED ORDER — DILTIAZEM HCL-DEXTROSE 125-5 MG/125ML-% IV SOLN (PREMIX)
5.0000 mg/h | INTRAVENOUS | Status: DC
  Administered 2021-11-21: 10 mg/h via INTRAVENOUS
  Administered 2021-11-21: 5 mg/h via INTRAVENOUS
  Administered 2021-11-22: 10 mg/h via INTRAVENOUS
  Filled 2021-11-21 (×5): qty 125

## 2021-11-21 MED ORDER — ARFORMOTEROL TARTRATE 15 MCG/2ML IN NEBU
15.0000 ug | INHALATION_SOLUTION | Freq: Two times a day (BID) | RESPIRATORY_TRACT | Status: DC
  Administered 2021-11-21 – 2021-11-26 (×11): 15 ug via RESPIRATORY_TRACT
  Filled 2021-11-21 (×12): qty 2

## 2021-11-21 MED ORDER — UMECLIDINIUM BROMIDE 62.5 MCG/ACT IN AEPB
1.0000 | INHALATION_SPRAY | Freq: Every day | RESPIRATORY_TRACT | Status: DC
  Administered 2021-11-25 – 2021-11-26 (×2): 1 via RESPIRATORY_TRACT
  Filled 2021-11-21 (×2): qty 7

## 2021-11-21 MED ORDER — CARBIDOPA-LEVODOPA 25-100 MG PO TABS
1.0000 | ORAL_TABLET | Freq: Four times a day (QID) | ORAL | Status: DC
  Administered 2021-11-21 – 2021-11-26 (×20): 1 via ORAL
  Filled 2021-11-21 (×24): qty 1

## 2021-11-21 MED ORDER — METOPROLOL SUCCINATE ER 50 MG PO TB24
25.0000 mg | ORAL_TABLET | Freq: Every day | ORAL | Status: DC
Start: 2021-11-21 — End: 2021-11-21

## 2021-11-21 MED ORDER — ALPRAZOLAM 0.5 MG PO TABS
0.5000 mg | ORAL_TABLET | Freq: Every evening | ORAL | Status: DC | PRN
  Administered 2021-11-21 – 2021-11-25 (×6): 0.5 mg via ORAL
  Filled 2021-11-21 (×6): qty 1

## 2021-11-21 MED ORDER — CARBINOXAMINE MALEATE 6 MG PO TABS
6.0000 mg | ORAL_TABLET | Freq: Two times a day (BID) | ORAL | Status: DC
  Administered 2021-11-21 – 2021-11-25 (×9): 6 mg via ORAL
  Filled 2021-11-21: qty 1

## 2021-11-21 MED ORDER — TRAMADOL HCL 50 MG PO TABS: 50.0000 mg | ORAL_TABLET | Freq: Four times a day (QID) | ORAL | Status: DC | PRN

## 2021-11-21 MED ORDER — VANCOMYCIN HCL 2000 MG/400ML IV SOLN
2000.0000 mg | Freq: Once | INTRAVENOUS | Status: AC
  Administered 2021-11-21: 2000 mg via INTRAVENOUS
  Filled 2021-11-21: qty 400

## 2021-11-21 MED ORDER — DILTIAZEM HCL ER COATED BEADS 120 MG PO CP24: 240.0000 mg | ORAL_CAPSULE | Freq: Every day | ORAL | Status: DC

## 2021-11-21 MED ORDER — SODIUM CHLORIDE 0.9 % IV SOLN
2.0000 g | Freq: Once | INTRAVENOUS | Status: AC
  Administered 2021-11-21: 2 g via INTRAVENOUS
  Filled 2021-11-21: qty 2

## 2021-11-21 MED ORDER — IPRATROPIUM BROMIDE 0.03 % NA SOLN
1.0000 | Freq: Two times a day (BID) | NASAL | Status: DC
  Administered 2021-11-22: 1 via NASAL
  Administered 2021-11-22 – 2021-11-23 (×3): 2 via NASAL
  Administered 2021-11-24 – 2021-11-26 (×4): 1 via NASAL
  Filled 2021-11-21 (×2): qty 30

## 2021-11-21 MED ORDER — SODIUM CHLORIDE 0.9 % IV SOLN
2.0000 g | Freq: Three times a day (TID) | INTRAVENOUS | Status: DC
  Administered 2021-11-21 – 2021-11-26 (×14): 2 g via INTRAVENOUS
  Filled 2021-11-21 (×15): qty 2

## 2021-11-21 MED ORDER — TOPIRAMATE 100 MG PO TABS
100.0000 mg | ORAL_TABLET | Freq: Two times a day (BID) | ORAL | Status: DC
  Administered 2021-11-21 – 2021-11-26 (×11): 100 mg via ORAL
  Filled 2021-11-21 (×11): qty 1

## 2021-11-21 MED ORDER — IOHEXOL 350 MG/ML SOLN
75.0000 mL | Freq: Once | INTRAVENOUS | Status: AC | PRN
  Administered 2021-11-21: 75 mL via INTRAVENOUS

## 2021-11-21 MED ORDER — TAMSULOSIN HCL 0.4 MG PO CAPS
0.4000 mg | ORAL_CAPSULE | Freq: Every day | ORAL | Status: DC
  Administered 2021-11-21 – 2021-11-25 (×5): 0.4 mg via ORAL
  Filled 2021-11-21 (×5): qty 1

## 2021-11-21 MED ORDER — GUAIFENESIN ER 600 MG PO TB12
1200.0000 mg | ORAL_TABLET | Freq: Two times a day (BID) | ORAL | Status: DC
  Administered 2021-11-21 – 2021-11-26 (×11): 1200 mg via ORAL
  Filled 2021-11-21 (×11): qty 2

## 2021-11-21 MED ORDER — LORATADINE 10 MG PO TABS
10.0000 mg | ORAL_TABLET | Freq: Every day | ORAL | Status: DC
  Administered 2021-11-21 – 2021-11-26 (×6): 10 mg via ORAL
  Filled 2021-11-21 (×6): qty 1

## 2021-11-21 MED ORDER — AMIODARONE HCL 200 MG PO TABS
200.0000 mg | ORAL_TABLET | Freq: Every day | ORAL | Status: DC
  Administered 2021-11-21 – 2021-11-25 (×5): 200 mg via ORAL
  Filled 2021-11-21 (×6): qty 1

## 2021-11-21 MED ORDER — METOPROLOL SUCCINATE ER 25 MG PO TB24
25.0000 mg | ORAL_TABLET | Freq: Every day | ORAL | Status: DC
  Administered 2021-11-21 – 2021-11-26 (×6): 25 mg via ORAL
  Filled 2021-11-21 (×6): qty 1

## 2021-11-21 MED ORDER — ADULT MULTIVITAMIN W/MINERALS CH
1.0000 | ORAL_TABLET | Freq: Every day | ORAL | Status: DC
  Administered 2021-11-21 – 2021-11-26 (×6): 1 via ORAL
  Filled 2021-11-21 (×6): qty 1

## 2021-11-21 MED ORDER — SENNOSIDES-DOCUSATE SODIUM 8.6-50 MG PO TABS
1.0000 | ORAL_TABLET | Freq: Two times a day (BID) | ORAL | Status: DC
  Administered 2021-11-21 – 2021-11-25 (×10): 1 via ORAL
  Filled 2021-11-21 (×11): qty 1

## 2021-11-21 MED ORDER — ACETAMINOPHEN 325 MG PO TABS: 650.0000 mg | ORAL_TABLET | Freq: Four times a day (QID) | ORAL | Status: DC | PRN

## 2021-11-21 MED ORDER — RIVAROXABAN 20 MG PO TABS
20.0000 mg | ORAL_TABLET | Freq: Every evening | ORAL | Status: DC
  Administered 2021-11-21 – 2021-11-25 (×5): 20 mg via ORAL
  Filled 2021-11-21 (×5): qty 1

## 2021-11-21 MED ORDER — SODIUM CHLORIDE 0.9 % IV SOLN: 1.0000 g | Freq: Once | INTRAVENOUS | Status: DC

## 2021-11-21 MED ORDER — LEVALBUTEROL HCL 0.63 MG/3ML IN NEBU
0.6300 mg | INHALATION_SOLUTION | Freq: Two times a day (BID) | RESPIRATORY_TRACT | Status: DC
Start: 2021-11-21 — End: 2021-11-21

## 2021-11-21 MED ORDER — DILTIAZEM LOAD VIA INFUSION
10.0000 mg | Freq: Once | INTRAVENOUS | Status: AC
  Administered 2021-11-21: 10 mg via INTRAVENOUS
  Filled 2021-11-21: qty 10

## 2021-11-21 MED ORDER — ONDANSETRON HCL 4 MG PO TABS: 8.0000 mg | ORAL_TABLET | Freq: Three times a day (TID) | ORAL | Status: DC | PRN

## 2021-11-21 MED ORDER — MAGNESIUM OXIDE -MG SUPPLEMENT 400 (240 MG) MG PO TABS
400.0000 mg | ORAL_TABLET | Freq: Every day | ORAL | Status: DC
  Administered 2021-11-21 – 2021-11-26 (×6): 400 mg via ORAL
  Filled 2021-11-21 (×6): qty 1

## 2021-11-21 MED ORDER — FLUTICASONE PROPIONATE 50 MCG/ACT NA SUSP
2.0000 | Freq: Every day | NASAL | Status: DC
  Administered 2021-11-22 – 2021-11-26 (×5): 2 via NASAL
  Filled 2021-11-21 (×2): qty 16

## 2021-11-21 NOTE — ED Provider Notes (Signed)
°  Physical Exam  BP 103/81    Pulse (!) 122    Temp 98.2 F (36.8 C) (Oral)    Resp 15    Ht 6\' 1"  (1.854 m)    Wt 104.9 kg    SpO2 100%    BMI 30.51 kg/m     Procedures  Procedures  ED Course / MDM    Medical Decision Making Amount and/or Complexity of Data Reviewed Labs: ordered. Radiology: ordered.  Risk Prescription drug management. Decision regarding hospitalization.   51M  PMH stage IV NSCLC with liver mets, PAF, CAD, chronic low back p ain, anemia, anxiety, osteoarthritis, HLD, HTN, GERD, OSA on CPAP, COPD on 4 L of oxygen, recently discharge to rehab, did not have O2 available, was hypoxic on room air. Hospitalist consulted, no need for admission. Has afib off Tikosyn, now in RVR despite home medications. May need to re-engage for admission given uncontrolled afib.   CTA PE study performed which revealed:    IMPRESSION:  1. No CT evidence for acute pulmonary embolus.  2. Enlargement of the pulmonary outflow tract/main pulmonary  arteries suggests pulmonary arterial hypertension.  3. Marked volume loss with extensive pleuroparenchymal scarring in  the left hemithorax, presumably treatment related although there is  progressive consolidative opacity and volume loss at the left base.  Features may be related to infection/inflammation although disease  progression could certainly have this appearance.  4. Interval increase in interstitial and ill-defined ground-glass  opacity in the posterior right lower lobe with patchy areas of  subtle ground-glass opacity in the right upper lobe. Findings likely  reflect multifocal right-sided pneumonia.  5. Small right pleural effusion, new in the interval.  6. Interval progression of multiple ill-defined liver lesions  consistent with metastatic disease.  7. Aortic Atherosclerosis (ICD10-I70.0).    Labs significant for a leukocytosis to 11.6. Patient tachycardic, tachypneic, with findings on CT concerning for PNA. Pt started on  Rocephin and Doxycycline. Also in uncontrolled afib now requiring diltiazem load and infusion. Hospitalist medicine re-engaged for admission.  Discussed the patient with Dr. Harl Bowie of cardiology who will see the patient in consultation.  Discussed with Dr. Marylyn Ishihara who accepted the patient in admission for further management of his uncontrolled atrial fibrillation, concern for new onset healthcare associated pneumonia.  Vancomycin and cefepime ordered.  On repeat evaluation, the patient was much more rate controlled on a diltiazem gtt.    Regan Lemming, MD 11/21/21 1404

## 2021-11-21 NOTE — Plan of Care (Signed)
°  Problem: Education: Goal: Knowledge of disease or condition will improve Outcome: Progressing   Problem: Activity: Goal: Ability to tolerate increased activity will improve Outcome: Progressing   Problem: Education: Goal: Knowledge of General Education information will improve Description: Including pain rating scale, medication(s)/side effects and non-pharmacologic comfort measures Outcome: Progressing   Problem: Clinical Measurements: Goal: Ability to maintain clinical measurements within normal limits will improve Outcome: Progressing

## 2021-11-21 NOTE — ED Notes (Signed)
Pt is lean and the bed and condom catheter is change.

## 2021-11-21 NOTE — Progress Notes (Signed)
Pt brought back from [facility] due to no available O2 at that facility.  CSW spoke with pt who has already spoken to his family about bringing his own home O2 DME to [facility] for use until the facility can provide.  CSW spoke with Milus Banister at [facility] and this is acceptable solution for them. CSW spoke with MD who reports pt not medically cleared to return, hospitalist to reevaluate.  CSW spoke with pt who is aware, has told his family to wait. TOC will continue to follow. Lurline Idol, MSW, LCSW 2/18/20238:58 AM

## 2021-11-21 NOTE — Consult Note (Signed)
Hospitalist Consultation History and Physical    [name] PZW:[id number] DOB: 06-02-1946 DOA: 11/21/2021  DOS: the patient was seen and examined on 11/21/2021  PCP: [name], MD   Patient coming from: [facility]  I have personally briefly reviewed patient's old medical records in [facility]  Chief complaint: No oxygen concentrator at the facility for use by the patient. History present illness: 76 year old gentleman was released from the hospital yesterday around 8 PM arrived at the facility only to find that there was not an available O2 concentrator for him to use.  Uses 4 L a minute supplemental oxygen all the time.  He has a home concentrator for this.  Patient was told that he was going to have a concentrator at the facility on his arrival.  He states that the nurse at the facility cannot find an available concentrator from the use.  He states he was transported without oxygen by EMS when he left the hospital on the way to the facility.  Patient states that he finally got settled in his bed at the facility only to find out from the nurse that there was not an available concentrator for him to use.  Patient was sent back to the ER due to lack of oxygen concentrator for him to use at the facility.  Facility is not equipped for oxygen from the wall.  On arrival, his O2 saturations were 86% on room air.  He was placed on 4 L his oxygen level came back up to 97.  No labs were drawn as the patient was just discharged.  [facility] hospital[facility] consulted to help with medication reconciliation.  Discussed with the EDP that the best thing for the patient would be to get his home O2 concentrator and bring to facility that way he will know how to operate the machine and that he will know that the machine is working.  Admission to the hospital declined.   ED Course: Initial room air sats of 86%.  Increased to 97% after 4 liters were started.  Review of Systems:  Review of  Systems  Constitutional: Negative.   HENT: Negative.    Eyes: Negative.   Respiratory: Negative.    Cardiovascular: Negative.   Gastrointestinal: Negative.   Genitourinary: Negative.   Musculoskeletal: Negative.   Skin: Negative.   Neurological: Negative.   Endo/Heme/Allergies: Negative.   Psychiatric/Behavioral: Negative.     Past Medical History:  Diagnosis Date   Anemia    Anxiety    Arthritis    "back, right knee, hands, ankles, neck" (05/31/2016)   Atrial fibrillation with RVR ([place]) 11/03/2021   Carotid artery disease (HCC)    Chronic lower back pain    COPD with acute exacerbation ([place]) 04/02/2021   Coronary atherosclerosis of native coronary artery    a. BMS to [facility] 2004 and 2007, otherwise mild nonobstructive disease. EF normal.   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 Discovered after stroke.   Pleural effusion on left 02/24/2021   Pneumonia 01/1996   PONV (postoperative nausea and vomiting)    Prostate CA (HCC)    Recurrent upper respiratory infection (URI)    Stroke ([place]) 07/2012   no deficits   Symptomatic anemia    Visit for monitoring Tikosyn therapy 09/18/2019    Past Surgical History:  Procedure Laterality Date   ADENOIDECTOMY     ANTERIOR CERVICAL DECOMP/DISCECTOMY FUSION  07/2001; 10/2002   "C5-6; C6-7; redo"   BACK SURGERY     CARPAL TUNNEL RELEASE Left 10/2015   CHEST TUBE INSERTION Left 03/17/2021   Procedure: INSERTION PLEURAL DRAINAGE CATHETER;  Surgeon: Garner Nash, DO;  Location: [facility];  Service:  Pulmonary;  Laterality: Left;  Indwelling Tunneled Pleural catheter (PLEUREX)    COLONOSCOPY W/ POLYPECTOMY  02/2014   CORONARY ANGIOPLASTY WITH STENT PLACEMENT  05/2003; 12/2005   "mid RCA; mid RCA"   FINE NEEDLE ASPIRATION  02/26/2021   Procedure: FINE NEEDLE ASPIRATION (FNA) LINEAR;  Surgeon: Candee Furbish, MD;  Location: [facility] ENDOSCOPY;  Service: Pulmonary;;   HAND SURGERY  02/2019   LEFT HAND   implantable loop recorder placement  02/27/2020   Medtronic Reveal IXL model [id number]  implantable loop recorder implanted by Dr Rayann Heman for afib management and evaluation of presyncope   IR IMAGING GUIDED PORT INSERTION  04/16/2021   JOINT REPLACEMENT     KNEE ARTHROSCOPY Left 10/2005   KNEE ARTHROSCOPY W/ PARTIAL MEDIAL MENISCECTOMY Left 09/2005   LUMBAR LAMINECTOMY/DECOMPRESSION MICRODISCECTOMY  03/2005   "L4-5"   POSTERIOR LUMBAR FUSION  10/2003   L5-S1; "plates, screws"   SHOULDER ARTHROSCOPY Right 08/2011   Debridement of labrum, arthroscopic distal clavicle excision   SHOULDER OPEN ROTATOR CUFF REPAIR Left 07/2014   TEE WITHOUT CARDIOVERSION  07/07/2012   Procedure: TRANSESOPHAGEAL ECHOCARDIOGRAM (TEE);  Surgeon: Fay Records, MD;  Location: [facility] ENDOSCOPY;  Service: Cardiovascular;  Laterality: N/A;   THORACENTESIS N/A 02/25/2021   Procedure: Mathews Robinsons;  Surgeon: Juanito Doom, MD;  Location: [facility];  Service: Cardiopulmonary;  Laterality: N/A;   TONSILLECTOMY AND ADENOIDECTOMY  ~ 1956   TOTAL KNEE ARTHROPLASTY Left 10/2006   TRIGGER FINGER RELEASE Left 10/2015   VIDEO BRONCHOSCOPY WITH ENDOBRONCHIAL ULTRASOUND Left 02/26/2021   Procedure: VIDEO BRONCHOSCOPY WITH ENDOBRONCHIAL ULTRASOUND;  Surgeon: Candee Furbish, MD;  Location: [facility] ENDOSCOPY;  Service: Pulmonary;  Laterality: Left;  cryoprobe too thanks!     reports that he quit smoking about 18 years ago. His smoking use included cigarettes. He has a 34.00 pack-year smoking history. He has never been exposed to tobacco  smoke. He has never used smokeless tobacco. He reports that he does not drink alcohol and does not use drugs.  Allergies  Allergen Reactions   Amoxicillin Anaphylaxis and Swelling    Tolerates Rocephin   Fosinopril Other (See Comments) and Cough    Muscle aches   Hydromorphone Nausea And Vomiting   Ampicillin Rash   Codeine Nausea Only and Other (See Comments)    Heavy amounts cause nausea and stomach upset   Monopril [Fosinopril Sodium] Other (See Comments)    Muscle aches & pain   Rosuvastatin Other (See Comments)    Joint pain    Family History  Problem Relation Age of Onset   Hypertension Mother    Heart disease Father    Heart attack Father    Prostate cancer Brother 66   Parkinson's disease Brother  Lung cancer Paternal Aunt        hx of smoking   Breast cancer Paternal Grandmother        dx 66s, bilateral mastectomies   Heart attack Paternal Grandfather    Blindness Son    Colon cancer Neg Hx    Pancreatic cancer Neg Hx     Prior to Admission medications   Medication Sig Start Date End Date Taking? Authorizing Provider  acetaminophen (TYLENOL) 325 MG tablet Take 2 tablets (650 mg total) by mouth every 6 (six) hours as needed for mild pain (or Fever >/= 101). 08/25/21   Angiulli, Lavon Paganini, PA-C  albuterol (PROVENTIL) (2.5 MG/3ML) 0.083% nebulizer solution Inhale 3 mLs into the lungs every 4 (four) hours as needed for wheezing or shortness of breath. Patient taking differently: Inhale 3 mLs into the lungs See admin instructions. Nebulize 2.5 mg & inhale into the lungs 3-4 times a day and may also use an additional 2.5 mg up to two times a day as needed for shortness of breath 08/28/21   Angiulli, Lavon Paganini, PA-C  ALPRAZolam Duanne Moron) 0.5 MG tablet Take 1 tablet (0.5 mg total) by mouth at bedtime as needed for anxiety. 11/19/21   Shelly Coss, MD  amiodarone (PACERONE) 200 MG tablet Take 1 tablet (200 mg total) by mouth daily for 6 days. 11/21/21 11/27/21  Shelly Coss,  MD  atorvastatin (LIPITOR) 40 MG tablet Take 1 tablet (40 mg total) by mouth daily. Patient taking differently: Take 40 mg by mouth at bedtime. 08/25/21   Angiulli, Lavon Paganini, PA-C  benzonatate (TESSALON) 200 MG capsule Take 1 capsule (200 mg total) by mouth 3 (three) times daily. Patient taking differently: Take 200 mg by mouth in the morning, at noon, and at bedtime. 10/07/21   Heilingoetter, Cassandra L, PA-C  budesonide (PULMICORT) 0.5 MG/2ML nebulizer solution Take 2 mLs (0.5 mg total) by nebulization 2 (two) times daily. Patient taking differently: Take 0.5 mg by nebulization in the morning and at bedtime. 09/25/21   Icard, Octavio Graves, DO  carbidopa-levodopa (SINEMET IR) 25-100 MG tablet Take 1 tablet by mouth 3 (three) times daily. Patient taking differently: Take 1 tablet by mouth See admin instructions. Take 1 tablet by mouth at 8 AM, 12 PM, 4 PM, and bedtime 07/09/21   Tat, Eustace Quail, DO  diltiazem (CARDIZEM CD) 120 MG 24 hr capsule Take 1 capsule (120 mg total) by mouth daily. 11/21/21   Shelly Coss, MD  fenofibrate 160 MG tablet TAKE 1 TABLET DAILY. PLEASE KEEP UPCOMING APPT IN MARCH WITH DR. Acie Fredrickson BEFORE ANYMORE REFILLS. Patient taking differently: Take 160 mg by mouth See admin instructions. Take 160 mg by mouth at 2 PM daily 08/25/21   Angiulli, Lavon Paganini, PA-C  fluticasone [facility]) 50 MCG/ACT nasal spray Place 2 sprays into both nostrils daily. 08/25/21   Angiulli, Lavon Paganini, PA-C  folic acid (FOLVITE) 1 MG tablet Take 1 tablet (1 mg total) by mouth daily. 08/25/21   Angiulli, Lavon Paganini, PA-C  guaiFENesin (MUCINEX) 600 MG 12 hr tablet Take 2 tablets (1,200 mg total) by mouth 2 (two) times daily. 08/12/21   Kathie Dike, MD  ipratropium (ATROVENT) 0.02 % nebulizer solution Take 2.5 mLs (0.5 mg total) by nebulization 2 (two) times daily. Patient not taking: Reported on 11/03/2021 08/28/21   Cathlyn Parsons, PA-C  ipratropium (ATROVENT) 0.03 % nasal spray Place 1-2 sprays into both  nostrils every 12 (twelve) hours. For drainage Patient taking differently: Place 2 sprays into both  nostrils in the morning. 08/28/21   Angiulli, Lavon Paganini, PA-C  levalbuterol (XOPENEX) 0.63 MG/3ML nebulizer solution Take 3 mLs (0.63 mg total) by nebulization 2 (two) times daily. 09/25/21   Icard, Octavio Graves, DO  loratadine (CLARITIN) 10 MG tablet Take 1 tablet (10 mg total) by mouth daily as needed for allergies (nasal drainage). Patient taking differently: Take 10 mg by mouth daily. 08/25/21   Angiulli, Lavon Paganini, PA-C  magnesium oxide (MAG-OX) 400 (240 Mg) MG tablet Take 1 tablet (400 mg total) by mouth daily. 08/25/21   Angiulli, Lavon Paganini, PA-C  metoprolol succinate (TOPROL-XL) 25 MG 24 hr tablet Take 1 tablet (25 mg total) by mouth daily. 08/25/21   Angiulli, Lavon Paganini, PA-C  Multiple Vitamins-Minerals (MULTIVITAMINS THER. W/MINERALS) TABS Take 1 tablet by mouth daily with breakfast.    [provider]  nitroGLYCERIN (NITROSTAT) 0.4 MG SL tablet Place 1 tablet (0.4 mg total) under the tongue every 5 (five) minutes as needed for chest pain. 08/25/21   Angiulli, Lavon Paganini, PA-C  omeprazole (PRILOSEC) 20 MG capsule Take 1 capsule (20 mg total) by mouth daily. Patient taking differently: Take 20 mg by mouth daily before breakfast. 08/25/21   Angiulli, Lavon Paganini, PA-C  ondansetron (ZOFRAN) 8 MG tablet Take 8 mg by mouth every 8 (eight) hours as needed for nausea or vomiting.    [provider]  polyethylene glycol (MIRALAX / GLYCOLAX) 17 g packet Take 17 g by mouth 2 (two) times daily. 11/20/21   Shelly Coss, MD  potassium chloride (KLOR-CON) 10 MEQ tablet TAKE 1 TABLET BY MOUTH EVERY DAY Patient taking differently: Take 10 mEq by mouth See admin instructions. Take 10 mEq by mouth at 2 PM daily 07/16/21   Nahser, Wonda Cheng, MD  predniSONE (DELTASONE) 10 MG tablet Take 3 tablets (30 mg total) by mouth daily with breakfast for 3 days. 11/21/21 11/24/21  Shelly Coss, MD  predniSONE  (DELTASONE) 10 MG tablet Take 1 tablet (10 mg total) by mouth daily with breakfast for 3 days. 12/01/21 12/04/21  Shelly Coss, MD  predniSONE (DELTASONE) 20 MG tablet Take 1 tablet (20 mg total) by mouth daily with breakfast for 3 days. 11/24/21 11/27/21  Shelly Coss, MD  predniSONE (DELTASONE) 5 MG tablet Take 1 tablet (5 mg total) by mouth daily with breakfast for 3 days. 12/08/21 12/11/21  Shelly Coss, MD  rivaroxaban (XARELTO) 20 MG TABS tablet Take 1 tablet (20 mg total) by mouth daily. Patient taking differently: Take 20 mg by mouth every evening. 08/25/21   Angiulli, Daniel J, PA-C  RYVENT 6 MG TABS TAKE 1 TABLET BY MOUTH 2 (TWO) TIMES DAILY AS NEEDED (DRAINAGE). Patient taking differently: Take 6 mg by mouth in the morning and at bedtime. 10/29/21   Garnet Sierras, DO  Saccharomyces boulardii (PROBIOTIC) 250 MG CAPS Take 250 mg by mouth daily.    [provider]  senna-docusate (SENOKOT-S) 8.6-50 MG tablet Take 1 tablet by mouth 2 (two) times daily. 11/20/21   Shelly Coss, MD  tamsulosin (FLOMAX) 0.4 MG CAPS capsule Take 1 capsule (0.4 mg total) by mouth daily after supper. 08/25/21   Angiulli, Lavon Paganini, PA-C  Tiotropium Bromide-Olodaterol (STIOLTO RESPIMAT) 2.5-2.5 MCG/ACT AERS Inhale 2 puffs into the lungs daily. 09/25/21   Icard, Octavio Graves, DO  topiramate (TOPAMAX) 50 MG tablet Take 2 tablets (100 mg total) by mouth 2 (two) times daily. 08/25/21   Angiulli, Lavon Paganini, PA-C  traMADol (ULTRAM) 50 MG tablet Take 1 tablet (50  mg total) by mouth every 6 (six) hours as needed for moderate pain. 11/19/21   Shelly Coss, MD  Vitamin D, Ergocalciferol, (DRISDOL) 1.25 MG (50000 UNIT) CAPS capsule Take 1 capsule (50,000 Units total) by mouth every 7 (seven) days. Monday Patient taking differently: Take 50,000 Units by mouth every Monday. 10/05/21   Brunetta Genera, MD    Physical Exam: Vitals:   11/21/21 0217 11/21/21 0229  BP:  120/87  Pulse:  (!) 128  Resp:  18  Temp:  98.2  F (36.8 C)  TempSrc:  Oral  SpO2:  100%  Weight: 104.9 kg   Height: 6\' 1"  (1.854 m)     Physical Exam Vitals and nursing note reviewed.  Constitutional:      General: He is not in acute distress.    Appearance: Normal appearance. He is obese. He is not ill-appearing, toxic-appearing or diaphoretic.  HENT:     Head: Normocephalic and atraumatic.     Nose: Nose normal. No rhinorrhea.  Eyes:     General: No scleral icterus. Cardiovascular:     Rate and Rhythm: Normal rate. Rhythm irregular.  Pulmonary:     Effort: Pulmonary effort is normal.     Breath sounds: Normal breath sounds.  Abdominal:     General: Bowel sounds are normal. There is no distension.     Tenderness: There is no abdominal tenderness. There is no guarding or rebound.  Musculoskeletal:     Right lower leg: No edema.     Left lower leg: No edema.  Skin:    General: Skin is warm and dry.     Capillary Refill: Capillary refill takes less than 2 seconds.  Neurological:     General: No focal deficit present.     Mental Status: He is alert and oriented to person, place, and time.     Labs on Admission: I have personally reviewed following labs and imaging studies  CBC: Recent Labs  Lab 11/14/21 0505 11/15/21 0501 11/18/21 0329  WBC 8.1 7.9 9.7  NEUTROABS 5.8 5.8  --   HGB 9.0* 9.0* 8.8*  HCT 28.1* 29.3* 27.9*  MCV 105.6* 107.7* 108.1*  PLT 185 195 935   Basic Metabolic Panel: Recent Labs  Lab 11/14/21 0505 11/15/21 0501 11/16/21 0456 11/17/21 0359 11/18/21 0329  NA 133* 133* 133* 133* 135  K 4.0 3.8 4.2 3.8 4.0  CL 100 99 100 101 100  CO2 28 28 27 26 27   GLUCOSE 100* 94 109* 111* 84  BUN 19 20 22  25* 25*  CREATININE 0.79 0.66 0.77 0.83 0.71  CALCIUM 8.8* 8.8* 8.6* 8.7* 8.7*  MG 1.9  --   --   --   --    GFR: Estimated Creatinine Clearance: 103 mL/min (by C-G formula based on SCr of 0.71 mg/dL). Liver Function Tests: Recent Labs  Lab 11/15/21 0501 11/16/21 0456 11/17/21 0359  11/18/21 0329  AST 25 22 19 21   ALT 9 6 12 12   ALKPHOS 59 43 65 63  BILITOT 0.2* 0.1* 0.2* 0.2*  PROT 6.1* 5.2* 5.7* 5.9*  ALBUMIN 2.7* 2.5* 2.7* 2.8*   No results for input(s): LIPASE, AMYLASE in the last 168 hours. No results for input(s): AMMONIA in the last 168 hours. Coagulation Profile: No results for input(s): INR, PROTIME in the last 168 hours. Cardiac Enzymes: No results for input(s): CKTOTAL, CKMB, CKMBINDEX, TROPONINI in the last 168 hours. BNP (last 3 results) No results for input(s): PROBNP in the last 8760 hours.  HbA1C: No results for input(s): HGBA1C in the last 72 hours. CBG: Recent Labs  Lab 11/16/21 0740  GLUCAP 100*   Lipid Profile: No results for input(s): CHOL, HDL, LDLCALC, TRIG, CHOLHDL, LDLDIRECT in the last 72 hours. Thyroid Function Tests: No results for input(s): TSH, T4TOTAL, FREET4, T3FREE, THYROIDAB in the last 72 hours. Anemia Panel: No results for input(s): VITAMINB12, FOLATE, FERRITIN, TIBC, IRON, RETICCTPCT in the last 72 hours. Urine analysis:    Component Value Date/Time   COLORURINE AMBER (A) 11/03/2021 1229   APPEARANCEUR CLEAR 11/03/2021 1229   LABSPEC 1.015 11/03/2021 1229   PHURINE 5.0 11/03/2021 1229   GLUCOSEU NEGATIVE 11/03/2021 1229   HGBUR NEGATIVE 11/03/2021 1229   BILIRUBINUR NEGATIVE 11/03/2021 1229   KETONESUR NEGATIVE 11/03/2021 1229   PROTEINUR NEGATIVE 11/03/2021 1229   NITRITE NEGATIVE 11/03/2021 1229   LEUKOCYTESUR NEGATIVE 11/03/2021 1229    Radiological Exams on Admission: I have personally reviewed images No results found.  EKG: I have personally reviewed EKG: no EKG  Assessment/Plan Principal Problem:   Chronic respiratory failure with hypoxia, on home O2 therapy (HCC) - 4 L/min Active Problems:   Physical deconditioning   Coronary artery disease   Benign essential HTN   Dyslipidemia   AF (paroxysmal atrial fibrillation) (HCC)   Essential hypertension   Morbid obesity (HCC)   Secondary  hypercoagulable state ([place])   Non-small cell carcinoma of left lung, stage 4 (HCC)   Supplemental oxygen dependent   COPD (chronic obstructive pulmonary disease) (HCC)   DNR (do not resuscitate)    Assessment and Plan: * Chronic respiratory failure with hypoxia, on home O2 therapy (HCC) - 4 L/min CM/SW to help arrange O2 at [facility] tomorrow.  Discussed with patient that it may be faster to have his family bring his home concentrator to the nursing home so he is able to use his old machine.  Then he will know that it is working and how to operate it.  This will also probably expedite his transfer back to the facility from the ER.  He states he will call his family in the morning.  Physical deconditioning- (present on admission) Needs to go back to rehab for therapy.  DNR (do not resuscitate) Verified DNR status.  COPD (chronic obstructive pulmonary disease) (HCC) Continue prednisone taper.  Supplemental oxygen dependent Continue supplemental oxygen at 4 L a minute.  Non-small cell carcinoma of left lung, stage 4 ([place])- (present on admission) Stage IV lung cancer.  Patient followed by oncology.  Secondary hypercoagulable state ([place])- (present on admission) Continue Xarelto  Morbid obesity ([place])- (present on admission) Chronic  Essential hypertension- (present on admission) Continue home blood pressure medication  AF (paroxysmal atrial fibrillation) ([place])- (present on admission) Is on Xarelto, amiodarone, Cardizem, Toprol  Dyslipidemia- (present on admission) Chronic  Benign essential HTN- (present on admission) Chronic and stable  Coronary artery disease- (present on admission) Chronic   DVT prophylaxis: Xarelto Code Status: DNR/DNI(Do NOT Intubate) Family Communication: no family at bedside  Disposition Plan: return to SNF once supplemental O2 supply has been verified at [facility]. Suggested to patient that he bring his own O2 concentrator to SNF.  Consults called: none   Admission status: Declined admission.  Kristopher Oppenheim, DO [facility] 11/21/2021, 3:23 AM

## 2021-11-21 NOTE — ED Provider Notes (Signed)
[facility] DEPT Provider Note   CSN: [id number] Arrival date & time: 11/21/21  0200     History  Chief Complaint  Patient presents with   Shortness of Breath    Corey [name] is a 76 y.o. male.  Presented to the emergency room with concern for low oxygen.  Review last discharge summary, discharged yesterday - PMH stage IV NSCLC with liver mets, PAF, CAD, chronic low back p ain, anemia, anxiety, osteoarthritis, HLD, HTN, GERD, OSA on CPAP, COPD on 4 L of oxygen   Admitted for A-fib with RVR, also with recent pneumonia.   Patient was discharged yesterday evening to a rehab facility.  Upon arrival to rehab facility, patient states that they did not have oxygen set up for him.  He was noted to be hypoxic on room air and sent back to ER.  He has no acute complaint at present, states that he feels fine.  Reports that he did receive his evening medications.    HPI     Home Medications Prior to Admission medications   Medication Sig Start Date End Date Taking? Authorizing Provider  acetaminophen (TYLENOL) 325 MG tablet Take 2 tablets (650 mg total) by mouth every 6 (six) hours as needed for mild pain (or Fever >/= 101). 08/25/21  Yes Angiulli, Lavon Paganini, PA-C  albuterol (PROVENTIL) (2.5 MG/3ML) 0.083% nebulizer solution Inhale 3 mLs into the lungs every 4 (four) hours as needed for wheezing or shortness of breath. Patient taking differently: Inhale 3 mLs into the lungs See admin instructions. Nebulize 2.5 mg & inhale into the lungs 3-4 times a day and may also use an additional 2.5 mg up to two times a day as needed for shortness of breath 08/28/21  Yes Angiulli, Lavon Paganini, PA-C  ALPRAZolam Duanne Moron) 0.5 MG tablet Take 1 tablet (0.5 mg total) by mouth at bedtime as needed for anxiety. 11/19/21  Yes Shelly Coss, MD  amiodarone (PACERONE) 200 MG tablet Take 1 tablet (200 mg total) by mouth daily for 6 days. 11/21/21 11/27/21 Yes Shelly Coss, MD  atorvastatin  (LIPITOR) 40 MG tablet Take 1 tablet (40 mg total) by mouth daily. Patient taking differently: Take 40 mg by mouth at bedtime. 08/25/21  Yes Angiulli, Lavon Paganini, PA-C  benzonatate (TESSALON) 200 MG capsule Take 1 capsule (200 mg total) by mouth 3 (three) times daily. Patient taking differently: Take 200 mg by mouth in the morning, at noon, and at bedtime. 10/07/21  Yes Heilingoetter, Cassandra L, PA-C  budesonide (PULMICORT) 0.5 MG/2ML nebulizer solution Take 2 mLs (0.5 mg total) by nebulization 2 (two) times daily. Patient taking differently: Take 0.5 mg by nebulization in the morning and at bedtime. 09/25/21  Yes Icard, Bradley L, DO  carbidopa-levodopa (SINEMET IR) 25-100 MG tablet Take 1 tablet by mouth 3 (three) times daily. Patient taking differently: Take 1 tablet by mouth See admin instructions. Take 1 tablet by mouth at 8 AM, 1 PM, 7 PM, and bedtime (10 PM) 07/09/21  Yes Tat, Eustace Quail, DO  Dextromethorphan-Menthol (DELSYM COUGH RELIEF MT) Use as directed 10 mLs in the mouth or throat at bedtime as needed (for cough).   Yes [provider]  diltiazem (CARDIZEM CD) 120 MG 24 hr capsule Take 1 capsule (120 mg total) by mouth daily. 11/21/21  Yes Shelly Coss, MD  fenofibrate 160 MG tablet TAKE 1 TABLET DAILY. PLEASE KEEP UPCOMING APPT IN MARCH WITH DR. Acie Fredrickson BEFORE ANYMORE REFILLS. Patient taking differently: Take 160 mg  by mouth See admin instructions. Take 160 mg by mouth at 2 PM daily 08/25/21  Yes Angiulli, Lavon Paganini, PA-C  fluticasone (FLONASE) 50 MCG/ACT nasal spray Place 2 sprays into both nostrils daily. 08/25/21  Yes Angiulli, Lavon Paganini, PA-C  folic acid (FOLVITE) 1 MG tablet Take 1 tablet (1 mg total) by mouth daily. 08/25/21  Yes Angiulli, Lavon Paganini, PA-C  guaiFENesin (MUCINEX) 600 MG 12 hr tablet Take 2 tablets (1,200 mg total) by mouth 2 (two) times daily. 08/12/21  Yes Kathie Dike, MD  ipratropium (ATROVENT) 0.03 % nasal spray Place 1-2 sprays into both nostrils every 12  (twelve) hours. For drainage Patient taking differently: Place 2 sprays into both nostrils in the morning. 08/28/21  Yes Angiulli, Lavon Paganini, PA-C  levalbuterol (XOPENEX) 0.63 MG/3ML nebulizer solution Take 3 mLs (0.63 mg total) by nebulization 2 (two) times daily. 09/25/21  Yes Icard, Octavio Graves, DO  loratadine (CLARITIN) 10 MG tablet Take 1 tablet (10 mg total) by mouth daily as needed for allergies (nasal drainage). Patient taking differently: Take 10 mg by mouth daily as needed for allergies. 08/25/21  Yes Angiulli, Lavon Paganini, PA-C  magnesium oxide (MAG-OX) 400 (240 Mg) MG tablet Take 1 tablet (400 mg total) by mouth daily. 08/25/21  Yes Angiulli, Lavon Paganini, PA-C  metoprolol succinate (TOPROL-XL) 25 MG 24 hr tablet Take 1 tablet (25 mg total) by mouth daily. 08/25/21  Yes Angiulli, Lavon Paganini, PA-C  Multiple Vitamins-Minerals (MULTIVITAMINS THER. W/MINERALS) TABS Take 1 tablet by mouth daily with breakfast.   Yes [provider]  omeprazole (PRILOSEC) 20 MG capsule Take 1 capsule (20 mg total) by mouth daily. Patient taking differently: Take 20 mg by mouth daily before breakfast. 08/25/21  Yes Angiulli, Lavon Paganini, PA-C  ondansetron (ZOFRAN) 8 MG tablet Take 8 mg by mouth every 8 (eight) hours as needed for nausea or vomiting.   Yes [provider]  polyethylene glycol (MIRALAX / GLYCOLAX) 17 g packet Take 17 g by mouth 2 (two) times daily. Patient taking differently: Take 17 g by mouth daily. 11/20/21  Yes Adhikari, Tamsen Meek, MD  potassium chloride (KLOR-CON) 10 MEQ tablet TAKE 1 TABLET BY MOUTH EVERY DAY Patient taking differently: Take 10 mEq by mouth See admin instructions. Take 10 mEq by mouth at 2 PM daily 07/16/21  Yes Nahser, Wonda Cheng, MD  predniSONE (DELTASONE) 10 MG tablet Take 3 tablets (30 mg total) by mouth daily with breakfast for 3 days. 11/21/21 11/24/21 Yes Shelly Coss, MD  rivaroxaban (XARELTO) 20 MG TABS tablet Take 1 tablet (20 mg total) by mouth daily. Patient taking  differently: Take 20 mg by mouth every evening. 08/25/21  Yes Angiulli, Daniel J, PA-C  RYVENT 6 MG TABS TAKE 1 TABLET BY MOUTH 2 (TWO) TIMES DAILY AS NEEDED (DRAINAGE). Patient taking differently: Take 6 mg by mouth in the morning and at bedtime. 10/29/21  Yes Garnet Sierras, DO  Saccharomyces boulardii (PROBIOTIC) 250 MG CAPS Take 250 mg by mouth daily.   Yes [provider]  senna-docusate (SENOKOT-S) 8.6-50 MG tablet Take 1 tablet by mouth 2 (two) times daily. 11/20/21  Yes Shelly Coss, MD  tamsulosin (FLOMAX) 0.4 MG CAPS capsule Take 1 capsule (0.4 mg total) by mouth daily after supper. 08/25/21  Yes Angiulli, Lavon Paganini, PA-C  Tiotropium Bromide-Olodaterol (STIOLTO RESPIMAT) 2.5-2.5 MCG/ACT AERS Inhale 2 puffs into the lungs daily. 09/25/21  Yes Icard, Bradley L, DO  topiramate (TOPAMAX) 50 MG tablet Take 2 tablets (100 mg total) by  mouth 2 (two) times daily. Patient taking differently: Take 50-100 mg by mouth See admin instructions. Take 50mg  by mouth in the morning, then 100mg  at night 08/25/21  Yes Angiulli, Lavon Paganini, PA-C  Vitamin D, Ergocalciferol, (DRISDOL) 1.25 MG (50000 UNIT) CAPS capsule Take 1 capsule (50,000 Units total) by mouth every 7 (seven) days. Monday Patient taking differently: Take 50,000 Units by mouth every Monday. 10/05/21  Yes Brunetta Genera, MD  nitroGLYCERIN (NITROSTAT) 0.4 MG SL tablet Place 1 tablet (0.4 mg total) under the tongue every 5 (five) minutes as needed for chest pain. Patient not taking: Reported on 11/21/2021 08/25/21   Angiulli, Lavon Paganini, PA-C  predniSONE (DELTASONE) 10 MG tablet Take 1 tablet (10 mg total) by mouth daily with breakfast for 3 days. 12/01/21 12/04/21  Shelly Coss, MD  predniSONE (DELTASONE) 20 MG tablet Take 1 tablet (20 mg total) by mouth daily with breakfast for 3 days. 11/24/21 11/27/21  Shelly Coss, MD  predniSONE (DELTASONE) 5 MG tablet Take 1 tablet (5 mg total) by mouth daily with breakfast for 3 days. 12/08/21 12/11/21   Shelly Coss, MD  traMADol (ULTRAM) 50 MG tablet Take 1 tablet (50 mg total) by mouth every 6 (six) hours as needed for moderate pain. Patient not taking: Reported on 11/21/2021 11/19/21   Shelly Coss, MD      Allergies    Amoxicillin, Fosinopril, Hydromorphone, Ampicillin, Codeine, and Rosuvastatin    Review of Systems   Review of Systems  All other systems reviewed and are negative.  Physical Exam Updated Vital Signs BP 105/82    Pulse (!) 127    Temp 98.2 F (36.8 C) (Oral)    Resp 20    Ht 6\' 1"  (1.854 m)    Wt 104.9 kg    SpO2 100%    BMI 30.51 kg/m  Physical Exam Vitals and nursing note reviewed.  Constitutional:      General: He is not in acute distress.    Appearance: He is well-developed.  HENT:     Head: Normocephalic and atraumatic.  Eyes:     Conjunctiva/sclera: Conjunctivae normal.  Cardiovascular:     Rate and Rhythm: Tachycardia present. Rhythm irregular.     Heart sounds: No murmur heard. Pulmonary:     Effort: Pulmonary effort is normal. No respiratory distress.     Breath sounds: Normal breath sounds.  Abdominal:     Palpations: Abdomen is soft.     Tenderness: There is no abdominal tenderness.  Musculoskeletal:        General: No swelling.     Cervical back: Neck supple.  Skin:    General: Skin is warm and dry.     Capillary Refill: Capillary refill takes less than 2 seconds.  Neurological:     Mental Status: He is alert.  Psychiatric:        Mood and Affect: Mood normal.    ED Results / Procedures / Treatments   Labs (all labs ordered are listed, but only abnormal results are displayed) Labs Reviewed  RESP PANEL BY RT-PCR (FLU A&B, COVID) [id number]    EKG EKG Interpretation  Date/Time:  Saturday November 21 2021 03:33:29 EST Ventricular Rate:  126 PR Interval:  144 QRS Duration: 102 QT Interval:  366 QTC Calculation: 530 R Axis:   59 Text Interpretation: atrial fibrillation with rvr Nonspecific ST abnormality no significant change  when compared to prior ecg on feb 7 Confirmed by Madalyn Rob [phone]) on 11/21/2021 4:17:34 AM  Radiology  No results found.  Procedures Procedures    Medications Ordered in ED Medications  acetaminophen (TYLENOL) tablet 650 mg (has no administration in time range)  albuterol (PROVENTIL) (2.5 MG/3ML) 0.083% nebulizer solution 3 mL (has no administration in time range)  ALPRAZolam (XANAX) tablet 0.5 mg (0.5 mg Oral Given 11/21/21 0435)  amiodarone (PACERONE) tablet 200 mg (has no administration in time range)  atorvastatin (LIPITOR) tablet 40 mg (has no administration in time range)  benzonatate (TESSALON) capsule 200 mg (has no administration in time range)  budesonide (PULMICORT) nebulizer solution 0.5 mg (has no administration in time range)  carbidopa-levodopa (SINEMET IR) 25-100 MG per tablet immediate release 1 tablet (has no administration in time range)  fenofibrate tablet 160 mg (has no administration in time range)  fluticasone (FLONASE) 50 MCG/ACT nasal spray 2 spray (has no administration in time range)  folic acid (FOLVITE) tablet 1 mg (has no administration in time range)  guaiFENesin (MUCINEX) 12 hr tablet 1,200 mg (has no administration in time range)  ipratropium (ATROVENT) 0.03 % nasal spray 1-2 spray (has no administration in time range)  loratadine (CLARITIN) tablet 10 mg (has no administration in time range)  magnesium oxide (MAG-OX) tablet 400 mg (has no administration in time range)  multivitamin with minerals tablet 1 tablet (has no administration in time range)  pantoprazole (PROTONIX) EC tablet 40 mg (has no administration in time range)  ondansetron (ZOFRAN) tablet 8 mg (has no administration in time range)  polyethylene glycol (MIRALAX / GLYCOLAX) packet 17 g (has no administration in time range)  potassium chloride (KLOR-CON) CR tablet 10 mEq (has no administration in time range)  predniSONE (DELTASONE) tablet 30 mg (has no administration in time range)   rivaroxaban (XARELTO) tablet 20 mg (has no administration in time range)  Carbinoxamine Maleate TABS 6 mg (has no administration in time range)  saccharomyces boulardii (FLORASTOR) capsule 250 mg (has no administration in time range)  senna-docusate (Senokot-S) tablet 1 tablet (has no administration in time range)  tamsulosin (FLOMAX) capsule 0.4 mg (has no administration in time range)  arformoterol (BROVANA) nebulizer solution 15 mcg (has no administration in time range)    And  umeclidinium bromide (INCRUSE ELLIPTA) 62.5 MCG/ACT 1 puff (has no administration in time range)  topiramate (TOPAMAX) tablet 100 mg (has no administration in time range)  traMADol (ULTRAM) tablet 50 mg (has no administration in time range)  metoprolol succinate (TOPROL-XL) 24 hr tablet 25 mg (25 mg Oral Given 11/21/21 0435)  levalbuterol (XOPENEX) nebulizer solution 0.63 mg (has no administration in time range)  diltiazem (CARDIZEM CD) 24 hr capsule 120 mg (120 mg Oral Given 11/21/21 0517)  metoprolol tartrate (LOPRESSOR) injection 5 mg (5 mg Intravenous Given 11/21/21 0447)  metoprolol tartrate (LOPRESSOR) injection 5 mg (5 mg Intravenous Given 11/21/21 4944)    ED Course/ Medical Decision Making/ A&P                           Medical Decision Making Risk Prescription drug management.   76 year old gentleman PMH stage IV NSCLC with liver mets, PAF, CAD, chronic low back p ain, anemia, anxiety, osteoarthritis, HLD, HTN, GERD, OSA on CPAP, COPD on 4 L of oxygen was just discharged from the hospital after admission for A-fib with RVR.  He was discharged to a rehab facility however they did not properly have his oxygen set up and was noted to be hypoxic on room air.  Patient has no acute medical  complaint at present.  I discussed the case with hospitalist, Dr. Bridgett Larsson.  He feels the best thing for patient would be to remain in ER and have social work/case management work with patient and the rehab facility to get oxygen  set up and hopefully have him go back today.  Dr. Bridgett Larsson evaluated patient and placed his home medications for him.  Patient was noted to be somewhat tachycardic but had otherwise normal vital signs.  Will check EKG. demonstrating A-fib, rate 126.  Has history of difficult to control atrial fibrillation on multiple agents.  We gave dose of IV metoprolol and patient's home dose of oral metoprolol and Cardizem and patient had improvement in rate.  As patient remains asymptomatic, deferred additional lab or imaging studies today.  Will discuss case with Dr. Armandina Gemma oncoming day time doc.   Once TOC is able to figure out his oxygen at [facility], he can be discharged back to this facility assuming patient remains well appearing, without new symptoms and stable vitals.          Final Clinical Impression(s) / ED Diagnoses Final diagnoses:  Chronic respiratory failure with hypoxia (HCC)  Atrial fibrillation, unspecified type [facility])    Rx / DC Orders ED Discharge Orders     None         Lucrezia Starch, MD 11/21/21 [phone]

## 2021-11-21 NOTE — Assessment & Plan Note (Signed)
Continue Xarelto 

## 2021-11-21 NOTE — Assessment & Plan Note (Addendum)
-  Continue nebs and prednisone taper.

## 2021-11-21 NOTE — Consult Note (Signed)
Cardiology Consultation:   Patient ID: Corey [name] MRN: [id number]; DOB: 23-Oct-1945   Admission date: 11/21/2021  PCP:  [name], MD   [facility] Cardiologist:  Mertie Moores, MD        Chief Complaint:  AF RVR  Patient Profile:   Corey [name] is a 76 y.o. male with  hx of CAD s/p remote PCIs of RCA 2004 and 2007 with otherwise mild nonbstructive CAD, CVA, paroxysmal atrial fibrillation on Tikosyn, ILR in place, stage IV lung CA, HTN, HLD, prostate CA, OSA on CPAP, mild carotid artery disease (8-28% RICA, 00-34% LICA by duplex 91/7915), prior anemia requiring transfusions, mild dilation of ascending aorta with recent discharge who presents for worsening hypoxia post discharge.  History of Present Illness:   Corey [name] was just discharged to facility.  He has hx of AF with prior Tikosyn stopped for interaction with his cancer therapy.  Has ned Stage IV cancer and was started om palliative chemoradiation.  Found to have new mets.  Historically he has sx with palpitations but lately his RVR symptoms are related to cough.  HE has pancytopenia and QT prolongation las admission and his Tikosyn was stopped.  Seen 11/15/21 by cardiology: planned for metoprolol tartrate 100 mg PO BID, Xarelto, amiodarone 200 mg PO daily.  Patient had planned to have his home concentrator when he was transferred to facility.  Despite coordinating with the facility, the did no have safe connections to give him O2 and he was transferrred back to [facility].  We was returned to oxygen.  He may have missed some of his medication in transfer.  He has received ABV for potential PNA.  Through all of this has had AF RVR. Given 120 diltiazem this AM.  Needed drip now at 10 for rate control.  No CP, SOB, Palpitations or frequent cough (his prior sx in AF RVR)  It is unclear if he will be admitted.  Past Medical History:  Diagnosis Date   Anemia    Anxiety    Arthritis    "back, right knee, hands,  ankles, neck" (05/31/2016)   Atrial fibrillation with RVR ([place]) 11/03/2021   Carotid artery disease (HCC)    Chronic lower back pain    COPD with acute exacerbation ([place]) 04/02/2021   Coronary atherosclerosis of native coronary artery    a. BMS to [facility] 2004 and 2007, otherwise mild nonobstructive disease. EF normal.   Diverticulitis    Dyslipidemia    Essential hypertension, benign    Family history of breast cancer    Family history of lung cancer    Family history of prostate cancer    GERD (gastroesophageal reflux disease)    Hyponatremia 06/04/2016   Lumbar radiculopathy, chronic 02/04/2015   Right L5   Lung cancer ([place])    Mild dilation of ascending aorta ([place]) 08/2021   Obesity    OSA on CPAP    uses cpap   Parkinson's disease ([place])    Paroxysmal atrial fibrillation ([place])    a. Discovered after stroke.   Pleural effusion on left 02/24/2021   Pneumonia 01/1996   PONV (postoperative nausea and vomiting)    Prostate CA (HCC)    Recurrent upper respiratory infection (URI)    Stroke ([place]) 07/2012   no deficits   Symptomatic anemia    Visit for monitoring Tikosyn therapy 09/18/2019    Past Surgical History:  Procedure Laterality Date   ADENOIDECTOMY     ANTERIOR CERVICAL DECOMP/DISCECTOMY FUSION  07/2001; 10/2002   "C5-6; C6-7; redo"   BACK SURGERY     CARPAL TUNNEL RELEASE Left 10/2015   CHEST TUBE INSERTION Left 03/17/2021   Procedure: INSERTION PLEURAL DRAINAGE CATHETER;  Surgeon: Garner Nash, DO;  Location: [place];  Service: Pulmonary;  Laterality: Left;  Indwelling Tunneled Pleural catheter (PLEUREX)    COLONOSCOPY W/ POLYPECTOMY  02/2014   CORONARY ANGIOPLASTY WITH STENT PLACEMENT  05/2003; 12/2005   "mid RCA; mid RCA"   FINE NEEDLE ASPIRATION  02/26/2021   Procedure: FINE NEEDLE ASPIRATION (FNA) LINEAR;  Surgeon: Candee Furbish, MD;  Location: [facility] ENDOSCOPY;  Service: Pulmonary;;   HAND SURGERY  02/2019   LEFT HAND   implantable loop recorder placement   02/27/2020   [employer] Reve[employer] [place] model [id number]  implantable loop recorder implanted by Dr Rayann Heman for afib management and evaluation of presyncope   IR IMAGING GUIDED PORT INSERTION  04/16/2021   JOINT REPLACEMENT     KNEE ARTHROSCOPY Left 10/2005   KNEE ARTHROSCOPY W/ PARTIAL MEDIAL MENISCECTOMY Left 09/2005   LUMBAR LAMINECTOMY/DECOMPRESSION MICRODISCECTOMY  03/2005   "L4-5"   POSTERIOR LUMBAR FUSION  10/2003   L5-S1; "plates, screws"   SHOULDER ARTHROSCOPY Right 08/2011   Debridement of labrum, arthroscopic distal clavicle excision   SHOULDER OPEN ROTATOR CUFF REPAIR Left 07/2014   TEE WITHOUT CARDIOVERSION  07/07/2012   Procedure: TRANSESOPHAGEAL ECHOCARDIOGRAM (TEE);  Surgeon: Fay Records, MD;  Location: [facility] ENDOSCOPY;  Service: Cardiovascular;  Laterality: N/A;   THORACENTESIS N/A 02/25/2021   Procedure: Mathews Robinsons;  Surgeon: Juanito Doom, MD;  Location: [place];  Service: Cardiopulmonary;  Laterality: N/A;   TONSILLECTOMY AND ADENOIDECTOMY  ~ 1956   TOTAL KNEE ARTHROPLASTY Left 10/2006   TRIGGER FINGER RELEASE Left 10/2015   VIDEO BRONCHOSCOPY WITH ENDOBRONCHIAL ULTRASOUND Left 02/26/2021   Procedure: VIDEO BRONCHOSCOPY WITH ENDOBRONCHIAL ULTRASOUND;  Surgeon: Candee Furbish, MD;  Location: [facility] ENDOSCOPY;  Service: Pulmonary;  Laterality: Left;  cryoprobe too thanks!     Medications Prior to Admission: Prior to Admission medications   Medication Sig Start Date End Date Taking? Authorizing Provider  acetaminophen (TYLENOL) 325 MG tablet Take 2 tablets (650 mg total) by mouth every 6 (six) hours as needed for mild pain (or Fever >/= 101). 08/25/21  Yes Angiulli, Lavon Paganini, PA-C  albuterol (PROVENTIL) (2.5 MG/3ML) 0.083% nebulizer solution Inhale 3 mLs into the lungs every 4 (four) hours as needed for wheezing or shortness of breath. Patient taking differently: Inhale 3 mLs into the lungs See admin instructions. Nebulize 2.5 mg & inhale into the lungs 3-4 times a day  and may also use an additional 2.5 mg up to two times a day as needed for shortness of breath 08/28/21  Yes Angiulli, Lavon Paganini, PA-C  ALPRAZolam Duanne Moron) 0.5 MG tablet Take 1 tablet (0.5 mg total) by mouth at bedtime as needed for anxiety. 11/19/21  Yes Shelly Coss, MD  amiodarone (PACERONE) 200 MG tablet Take 1 tablet (200 mg total) by mouth daily for 6 days. 11/21/21 11/27/21 Yes Shelly Coss, MD  atorvastatin (LIPITOR) 40 MG tablet Take 1 tablet (40 mg total) by mouth daily. Patient taking differently: Take 40 mg by mouth at bedtime. 08/25/21  Yes Angiulli, Lavon Paganini, PA-C  benzonatate (TESSALON) 200 MG capsule Take 1 capsule (200 mg total) by mouth 3 (three) times daily. Patient taking differently: Take 200 mg by mouth in the morning, at noon, and at bedtime. 10/07/21  Yes Heilingoetter, Cassandra L, PA-C  budesonide (PULMICORT) 0.5 MG/2ML nebulizer solution Take 2 mLs (0.5 mg total) by nebulization 2 (two) times daily. Patient taking differently: Take 0.5 mg by nebulization in the morning and at bedtime. 09/25/21  Yes Icard, Bradley L, DO  carbidopa-levodopa (SINEMET IR) 25-100 MG tablet Take 1 tablet by mouth 3 (three) times daily. Patient taking differently: Take 1 tablet by mouth See admin instructions. Take 1 tablet by mouth at 8 AM, 1 PM, 7 PM, and bedtime (10 PM) 07/09/21  Yes Tat, Eustace Quail, DO  Dextromethorphan-Menthol (DELSYM COUGH RELIEF MT) Use as directed 10 mLs in the mouth or throat at bedtime as needed (for cough).   Yes [provider]  diltiazem (CARDIZEM CD) 120 MG 24 hr capsule Take 1 capsule (120 mg total) by mouth daily. 11/21/21  Yes Shelly Coss, MD  fenofibrate 160 MG tablet TAKE 1 TABLET DAILY. PLEASE KEEP UPCOMING APPT IN MARCH WITH DR. Acie Fredrickson BEFORE ANYMORE REFILLS. Patient taking differently: Take 160 mg by mouth See admin instructions. Take 160 mg by mouth at 2 PM daily 08/25/21  Yes Angiulli, Lavon Paganini, PA-C  fluticasone (FLONASE) 50 MCG/ACT nasal spray  Place 2 sprays into both nostrils daily. 08/25/21  Yes Angiulli, Lavon Paganini, PA-C  folic acid (FOLVITE) 1 MG tablet Take 1 tablet (1 mg total) by mouth daily. 08/25/21  Yes Angiulli, Lavon Paganini, PA-C  guaiFENesin (MUCINEX) 600 MG 12 hr tablet Take 2 tablets (1,200 mg total) by mouth 2 (two) times daily. 08/12/21  Yes Kathie Dike, MD  ipratropium (ATROVENT) 0.03 % nasal spray Place 1-2 sprays into both nostrils every 12 (twelve) hours. For drainage Patient taking differently: Place 2 sprays into both nostrils in the morning. 08/28/21  Yes Angiulli, Lavon Paganini, PA-C  levalbuterol (XOPENEX) 0.63 MG/3ML nebulizer solution Take 3 mLs (0.63 mg total) by nebulization 2 (two) times daily. 09/25/21  Yes Icard, Octavio Graves, DO  loratadine (CLARITIN) 10 MG tablet Take 1 tablet (10 mg total) by mouth daily as needed for allergies (nasal drainage). Patient taking differently: Take 10 mg by mouth daily as needed for allergies. 08/25/21  Yes Angiulli, Lavon Paganini, PA-C  magnesium oxide (MAG-OX) 400 (240 Mg) MG tablet Take 1 tablet (400 mg total) by mouth daily. 08/25/21  Yes Angiulli, Lavon Paganini, PA-C  metoprolol succinate (TOPROL-XL) 25 MG 24 hr tablet Take 1 tablet (25 mg total) by mouth daily. 08/25/21  Yes Angiulli, Lavon Paganini, PA-C  Multiple Vitamins-Minerals (MULTIVITAMINS THER. W/MINERALS) TABS Take 1 tablet by mouth daily with breakfast.   Yes [provider]  omeprazole (PRILOSEC) 20 MG capsule Take 1 capsule (20 mg total) by mouth daily. Patient taking differently: Take 20 mg by mouth daily before breakfast. 08/25/21  Yes Angiulli, Lavon Paganini, PA-C  ondansetron (ZOFRAN) 8 MG tablet Take 8 mg by mouth every 8 (eight) hours as needed for nausea or vomiting.   Yes [provider]  polyethylene glycol (MIRALAX / GLYCOLAX) 17 g packet Take 17 g by mouth 2 (two) times daily. Patient taking differently: Take 17 g by mouth daily. 11/20/21  Yes Adhikari, Tamsen Meek, MD  potassium chloride (KLOR-CON) 10 MEQ tablet  TAKE 1 TABLET BY MOUTH EVERY DAY Patient taking differently: Take 10 mEq by mouth See admin instructions. Take 10 mEq by mouth at 2 PM daily 07/16/21  Yes Nahser, Wonda Cheng, MD  predniSONE (DELTASONE) 10 MG tablet Take 3 tablets (30 mg total) by mouth daily with breakfast for 3 days. 11/21/21 11/24/21 Yes Shelly Coss, MD  rivaroxaban Alveda Reasons)  20 MG TABS tablet Take 1 tablet (20 mg total) by mouth daily. Patient taking differently: Take 20 mg by mouth every evening. 08/25/21  Yes Angiulli, Daniel J, PA-C  RYVENT 6 MG TABS TAKE 1 TABLET BY MOUTH 2 (TWO) TIMES DAILY AS NEEDED (DRAINAGE). Patient taking differently: Take 6 mg by mouth in the morning and at bedtime. 10/29/21  Yes Garnet Sierras, DO  Saccharomyces boulardii (PROBIOTIC) 250 MG CAPS Take 250 mg by mouth daily.   Yes [provider]  senna-docusate (SENOKOT-S) 8.6-50 MG tablet Take 1 tablet by mouth 2 (two) times daily. 11/20/21  Yes Shelly Coss, MD  tamsulosin (FLOMAX) 0.4 MG CAPS capsule Take 1 capsule (0.4 mg total) by mouth daily after supper. 08/25/21  Yes Angiulli, Lavon Paganini, PA-C  Tiotropium Bromide-Olodaterol (STIOLTO RESPIMAT) 2.5-2.5 MCG/ACT AERS Inhale 2 puffs into the lungs daily. 09/25/21  Yes Icard, Bradley L, DO  topiramate (TOPAMAX) 50 MG tablet Take 2 tablets (100 mg total) by mouth 2 (two) times daily. Patient taking differently: Take 50-100 mg by mouth See admin instructions. Take 50mg  by mouth in the morning, then 100mg  at night 08/25/21  Yes Angiulli, Lavon Paganini, PA-C  Vitamin D, Ergocalciferol, (DRISDOL) 1.25 MG (50000 UNIT) CAPS capsule Take 1 capsule (50,000 Units total) by mouth every 7 (seven) days. Monday Patient taking differently: Take 50,000 Units by mouth every Monday. 10/05/21  Yes Brunetta Genera, MD  nitroGLYCERIN (NITROSTAT) 0.4 MG SL tablet Place 1 tablet (0.4 mg total) under the tongue every 5 (five) minutes as needed for chest pain. Patient not taking: Reported on 11/21/2021 08/25/21   Angiulli,  Lavon Paganini, PA-C  predniSONE (DELTASONE) 10 MG tablet Take 1 tablet (10 mg total) by mouth daily with breakfast for 3 days. 12/01/21 12/04/21  Shelly Coss, MD  predniSONE (DELTASONE) 20 MG tablet Take 1 tablet (20 mg total) by mouth daily with breakfast for 3 days. 11/24/21 11/27/21  Shelly Coss, MD  predniSONE (DELTASONE) 5 MG tablet Take 1 tablet (5 mg total) by mouth daily with breakfast for 3 days. 12/08/21 12/11/21  Shelly Coss, MD  traMADol (ULTRAM) 50 MG tablet Take 1 tablet (50 mg total) by mouth every 6 (six) hours as needed for moderate pain. Patient not taking: Reported on 11/21/2021 11/19/21   Shelly Coss, MD     Allergies:    Allergies  Allergen Reactions   Amoxicillin Anaphylaxis and Swelling    Tolerates Rocephin   Fosinopril Other (See Comments) and Cough    Muscle aches (Monopril)   Hydromorphone Nausea And Vomiting   Ampicillin Rash   Codeine Nausea Only and Other (See Comments)    Heavy amounts cause nausea and stomach upset   Rosuvastatin Other (See Comments)    Joint pain    Social History:   Social History   Socioeconomic History   Marital status: Married    Spouse name: Renee   Number of children: 3   Years of education: 14   Highest education level: Not on file  Occupational History   Occupation: Retired    Fish farm manager: DISABLED     Comment: Works at [employer] part time  Tobacco Use   Smoking status: Former    Packs/day: 1.00    Years: 34.00    Pack years: 34.00    Types: Cigarettes    Quit date: 05/05/2003    Years since quitting: 18.5    Passive exposure: Never   Smokeless tobacco: Never  Vaping Use   Vaping Use: Never used  Substance and Sexual Activity   Alcohol use: No    Alcohol/week: 0.0 standard drinks   Drug use: No   Sexual activity: Not Currently  Other Topics Concern   Not on file  Social History Narrative   Patient lives at home with spouse.   Caffeine Use:    Social Determinants of Radio broadcast assistant Strain:  Not on file  Food Insecurity: Not on file  Transportation Needs: Not on file  Physical Activity: Not on file  Stress: Not on file  Social Connections: Not on file  Intimate Partner Violence: Not on file    Family History:   The patient's family history includes Blindness in his son; Breast cancer in his paternal grandmother; Heart attack in his father and paternal grandfather; Heart disease in his father; Hypertension in his mother; Lung cancer in his paternal aunt; Parkinson's disease in his brother; Prostate cancer (age of onset: 75) in his brother. There is no history of Colon cancer or Pancreatic cancer.    ROS:  Please see the history of present illness.  All other ROS reviewed and negative.     Physical Exam/Data:   Vitals:   11/21/21 1100 11/21/21 1130 11/21/21 1200 11/21/21 1219  BP: 118/78 122/90 (!) 136/95   Pulse: 90 63 (!) 55 64  Resp: 18 (!) 22 18 20   Temp:      TempSrc:      SpO2: 93% 96% 100% 100%  Weight:      Height:       No intake or output data in the 24 hours ending 11/21/21 1322 Last 3 Weights 11/21/2021 11/15/2021 10/28/2021  Weight (lbs) 231 lb 4.2 oz 231 lb 4.2 oz 228 lb 8 oz  Weight (kg) 104.9 kg 104.9 kg 103.647 kg     Body mass index is 30.51 kg/m.  General:  Elderly male mild distress HEENT: normal Neck: no JVD Vascular: No carotid bruits; Distal pulses 2+ bilaterally   Cardiac:  RRR no murmur Lungs:  Expiratory wheeze bilaterally Abd: soft, nontender, no hepatomegaly  Ext: non pitting edema edema Musculoskeletal:  No deformities, BUE and BLE strength normal and equal Skin: warm and dry  Neuro:  CNs 2-12 intact, no focal abnormalities noted Psych:  Normal affect    EKG:  The ECG that was done  was personally reviewed and demonstrates AFL RVR  Telemetry: AFL rates 140s-> 80s Laboratory Data:  High Sensitivity Troponin:   Recent Labs  Lab 11/21/21 0937  TROPONINIHS 10      Chemistry Recent Labs  Lab 11/18/21 0329 11/21/21 0937   NA 135 133*  K 4.0 3.9  CL 100 100  CO2 27 26  GLUCOSE 84 103*  BUN 25* 24*  CREATININE 0.71 0.78  CALCIUM 8.7* 9.0  MG  --  1.8  GFRNONAA >60 >60  ANIONGAP 8 7    Recent Labs  Lab 11/18/21 0329 11/21/21 0937  PROT 5.9* 6.6  ALBUMIN 2.8* 3.1*  AST 21 18  ALT 12 7  ALKPHOS 63 56  BILITOT 0.2* 0.3   Lipids No results for input(s): CHOL, TRIG, HDL, LABVLDL, LDLCALC, CHOLHDL in the last 168 hours. Hematology Recent Labs  Lab 11/18/21 0329 11/21/21 0937  WBC 9.7 11.6*  RBC 2.58* 2.76*  HGB 8.8* 9.4*  HCT 27.9* 30.1*  MCV 108.1* 109.1*  MCH 34.1* 34.1*  MCHC 31.5 31.2  RDW 16.6* 18.0*  PLT 232 266   Thyroid  Recent Labs  Lab 11/21/21 0937  TSH  1.572   BNP Recent Labs  Lab 11/21/21 0949  BNP 161.0*    DDimer No results for input(s): DDIMER in the last 168 hours.   Radiology/Studies:  CT Angio Chest PE W and/or Wo Contrast  Result Date: 11/21/2021 CLINICAL DATA:  Hypoxia.  Shortness of breath.  PE suspected. EXAM: CT ANGIOGRAPHY CHEST WITH CONTRAST TECHNIQUE: Multidetector CT imaging of the chest was performed using the standard protocol during bolus administration of intravenous contrast. Multiplanar CT image reconstructions and MIPs were obtained to evaluate the vascular anatomy. RADIATION DOSE REDUCTION: This exam was performed according to the departmental dose-optimization program which includes automated exposure control, adjustment of the mA and/or kV according to patient size and/or use of iterative reconstruction technique. CONTRAST:  37mL OMNIPAQUE IOHEXOL 350 MG/ML SOLN COMPARISON:  10/15/2021 FINDINGS: Cardiovascular: Heart is enlarged. Coronary artery calcification is evident. Mild atherosclerotic calcification is noted in the wall of the thoracic aorta. Enlargement of the pulmonary outflow tract/main pulmonary arteries suggests pulmonary arterial hypertension. There is no filling defect within the opacified pulmonary arteries to suggest the presence of  an acute pulmonary embolus. Mediastinum/Nodes: No mediastinal lymphadenopathy. There is no hilar lymphadenopathy. The esophagus has normal imaging features. There is no axillary lymphadenopathy. Lungs/Pleura: Marked volume loss with extensive pleuroparenchymal scarring in the left hemithorax is similar to prior, presumably treatment related although there is progressive consolidative opacity and volume loss at the left base. Interval increase in interstitial and ill-defined ground-glass opacity in the posterior right lower lobe with patchy areas of subtle ground-glass opacity in the right upper lobe. Small right pleural effusion is new in the interval. Upper Abdomen: Multiple ill-defined liver lesions again noted. Caudate lobe lesion appears progressive in the interval measuring 5.7 x 5.6 cm today compared to 4.1 x 3.2 cm previously. 1.4 cm lesion in the inferior right liver measured previously is 2.3 cm today on 283/6. 2.2 cm lesion measured in the dome of the liver on the prior study is 3.5 cm today on image 219/6. Musculoskeletal: No worrisome lytic or sclerotic osseous abnormality. Review of the MIP images confirms the above findings. IMPRESSION: 1. No CT evidence for acute pulmonary embolus. 2. Enlargement of the pulmonary outflow tract/main pulmonary arteries suggests pulmonary arterial hypertension. 3. Marked volume loss with extensive pleuroparenchymal scarring in the left hemithorax, presumably treatment related although there is progressive consolidative opacity and volume loss at the left base. Features may be related to infection/inflammation although disease progression could certainly have this appearance. 4. Interval increase in interstitial and ill-defined ground-glass opacity in the posterior right lower lobe with patchy areas of subtle ground-glass opacity in the right upper lobe. Findings likely reflect multifocal right-sided pneumonia. 5. Small right pleural effusion, new in the interval. 6.  Interval progression of multiple ill-defined liver lesions consistent with metastatic disease. 7. Aortic Atherosclerosis (ICD10-I70.0). Electronically Signed   By: Misty Stanley M.D.   On: 11/21/2021 10:31     Assessment and Plan:    #Atrial fibrillation/AFL with RVR #Possible CAP vs pneumonitis #Lung cancer stage IV # HFpEF # RV Dysfunction # Malnutrition - Increase dilt to 240mg  daily with hopes to wean off drip - continue amiodarone 200 mg PO daily- I do not suspect he will live long enough to develop pulmonary toxicity -Continue xarelto for [facility] - continue metoprolol 25, may be able to increase dose while  weaning drip (if RVR despite increase diltiazem increase to 50 mg) - patient may be reasonable served by hospice transition,we have only discussed this briefly -  statin is ok but given long term prognosis we may stop this medication -  [facility] Please consult [email or web address] for contact info under     Signed, Werner Lean, MD  11/21/2021 1:22 PM

## 2021-11-21 NOTE — Assessment & Plan Note (Addendum)
-   Statin discontinued due to lack of long-term benefit at this time

## 2021-11-21 NOTE — Assessment & Plan Note (Addendum)
-   On 4 L chronically -Will need to make sure facility is able to accommodate his concentrator prior to discharging back

## 2021-11-21 NOTE — Assessment & Plan Note (Addendum)
Stage IV lung cancer.  Patient followed by oncology, Dr. [name]

## 2021-11-21 NOTE — Procedures (Signed)
Set up pt on his home cpap unit. Plugged in red outlet and sterile water added. 4L O2 bled into the circuit. Pt prefer self placement.

## 2021-11-21 NOTE — Subjective & Objective (Signed)
Chief complaint: No oxygen concentrator at the facility for use by the patient. History present illness: 76 year old gentleman was released from the hospital yesterday around 8 PM arrived at the facility only to find that there was not an available O2 concentrator for him to use.  Uses 4 L a minute supplemental oxygen all the time.  He has a home concentrator for this.  Patient was told that he was going to have a concentrator at the facility on his arrival.  He states that the nurse at the facility cannot find an available concentrator from the use.  He states he was transported without oxygen by EMS when he left the hospital on the way to the facility.  Patient states that he finally got settled in his bed at the facility only to find out from the nurse that there was not an available concentrator for him to use.  Patient was sent back to the ER due to lack of oxygen concentrator for him to use at the facility.  Facility is not equipped for oxygen from the wall.  On arrival, his O2 saturations were 86% on room air.  He was placed on 4 L his oxygen level came back up to 97.  No labs were drawn as the patient was just discharged.  [facility] hospitalist consulted to help with medication reconciliation.  Discussed with the EDP that the best thing for the patient would be to get his home O2 concentrator and bring to facility that way he will know how to operate the machine and that he will know that the machine is working.  Admission to the hospital declined.

## 2021-11-21 NOTE — Assessment & Plan Note (Signed)
Continue supplemental oxygen at 4 L a minute.

## 2021-11-21 NOTE — Assessment & Plan Note (Signed)
Chronic. 

## 2021-11-21 NOTE — ED Triage Notes (Signed)
Patient was sent back from [facility] due to not having oxygen for patient and patient O2 Sat was 84% on room air. Patient was discharged from [facility] a little over an hour ago.

## 2021-11-21 NOTE — ED Notes (Signed)
Pt go his breakfast tray

## 2021-11-21 NOTE — Assessment & Plan Note (Addendum)
-   Plan is to return to rehab at discharge; CIR declined to reevaluate

## 2021-11-21 NOTE — Assessment & Plan Note (Signed)
Continue home blood pressure medication

## 2021-11-21 NOTE — Assessment & Plan Note (Signed)
Verified DNR status.

## 2021-11-21 NOTE — H&P (Signed)
History and Physical    Patient: Corey [name] WNU:[id number] DOB: 1945/10/06 DOA: 11/21/2021 DOS: the patient was seen and examined on 11/21/2021 PCP: [name], MD  Patient coming from: SNF  Chief Complaint:  Chief Complaint  Patient presents with   Shortness of Breath    HPI: Corey [name] is a 76 y.o. male with medical history significant of S4 NSCLC w/ mets, paroxysmal a fib, CAD, chronic low back pain, HLD, HTN, GERD, COPD. He was discharged to SNF yesterday after a hospital stay for a fib and PNA. When he arrived at the SNF, he found out that there was no available O2 concentrator for him to use. He is on chronic 4L [place]. He was sent back to the ED. While holding in the ED, he went into a fib w/ RVR. He was started on dilt gtt. Cardiology was consulted. [facility] was called for admission. He denies any other aggravating or alleviating factors.    Review of Systems: As mentioned in the history of present illness. All other systems reviewed and are negative. Past Medical History:  Diagnosis Date   Anemia    Anxiety    Arthritis    "back, right knee, hands, ankles, neck" (05/31/2016)   Atrial fibrillation with RVR ([place]) 11/03/2021   Carotid artery disease (HCC)    Chronic lower back pain    COPD with acute exacerbation ([place]) 04/02/2021   Coronary atherosclerosis of native coronary artery    a. BMS to [facility] 2004 and 2007, otherwise mild nonobstructive disease. EF normal.   Diverticulitis    Dyslipidemia    Essential hypertension, benign    Family history of breast cancer    Family history of lung cancer    Family history of prostate cancer    GERD (gastroesophageal reflux disease)    Hyponatremia 06/04/2016   Lumbar radiculopathy, chronic 02/04/2015   Right L5   Lung cancer ([place])    Mild dilation of ascending aorta ([place]) 08/2021   Obesity    OSA on CPAP    uses cpap   Parkinson's disease ([place])    Paroxysmal atrial fibrillation ([place])    a. Discovered after stroke.   Pleural  effusion on left 02/24/2021   Pneumonia 01/1996   PONV (postoperative nausea and vomiting)    Prostate CA (HCC)    Recurrent upper respiratory infection (URI)    Stroke ([place]) 07/2012   no deficits   Symptomatic anemia    Visit for monitoring Tikosyn therapy 09/18/2019   Past Surgical History:  Procedure Laterality Date   ADENOIDECTOMY     ANTERIOR CERVICAL DECOMP/DISCECTOMY FUSION  07/2001; 10/2002   "C5-6; C6-7; redo"   BACK SURGERY     CARPAL TUNNEL RELEASE Left 10/2015   CHEST TUBE INSERTION Left 03/17/2021   Procedure: INSERTION PLEURAL DRAINAGE CATHETER;  Surgeon: Garner Nash, DO;  Location: [place];  Service: Pulmonary;  Laterality: Left;  Indwelling Tunneled Pleural catheter (PLEUREX)    COLONOSCOPY W/ POLYPECTOMY  02/2014   CORONARY ANGIOPLASTY WITH STENT PLACEMENT  05/2003; 12/2005   "mid RCA; mid RCA"   FINE NEEDLE ASPIRATION  02/26/2021   Procedure: FINE NEEDLE ASPIRATION (FNA) LINEAR;  Surgeon: Candee Furbish, MD;  Location: [facility] ENDOSCOPY;  Service: Pulmonary;;   HAND SURGERY  02/2019   LEFT HAND   implantable loop recorder placement  02/27/2020   Medtronic Reveal [place] model [id number]  implantable loop recorder implanted by Dr Rayann Heman for afib management and evaluation of presyncope  IR IMAGING GUIDED PORT INSERTION  04/16/2021   JOINT REPLACEMENT     KNEE ARTHROSCOPY Left 10/2005   KNEE ARTHROSCOPY W/ PARTIAL MEDIAL MENISCECTOMY Left 09/2005   LUMBAR LAMINECTOMY/DECOMPRESSION MICRODISCECTOMY  03/2005   "L4-5"   POSTERIOR LUMBAR FUSION  10/2003   L5-S1; "plates, screws"   SHOULDER ARTHROSCOPY Right 08/2011   Debridement of labrum, arthroscopic distal clavicle excision   SHOULDER OPEN ROTATOR CUFF REPAIR Left 07/2014   TEE WITHOUT CARDIOVERSION  07/07/2012   Procedure: TRANSESOPHAGEAL ECHOCARDIOGRAM (TEE);  Surgeon: Fay Records, MD;  Location: [facility] ENDOSCOPY;  Service: Cardiovascular;  Laterality: N/A;   THORACENTESIS N/A 02/25/2021   Procedure: Mathews Robinsons;   Surgeon: Juanito Doom, MD;  Location: [place];  Service: Cardiopulmonary;  Laterality: N/A;   TONSILLECTOMY AND ADENOIDECTOMY  ~ 1956   TOTAL KNEE ARTHROPLASTY Left 10/2006   TRIGGER FINGER RELEASE Left 10/2015   VIDEO BRONCHOSCOPY WITH ENDOBRONCHIAL ULTRASOUND Left 02/26/2021   Procedure: VIDEO BRONCHOSCOPY WITH ENDOBRONCHIAL ULTRASOUND;  Surgeon: Candee Furbish, MD;  Location: [facility] ENDOSCOPY;  Service: Pulmonary;  Laterality: Left;  cryoprobe too thanks!   Social History:  reports that he quit smoking about 18 years ago. His smoking use included cigarettes. He has a 34.00 pack-year smoking history. He has never been exposed to tobacco smoke. He has never used smokeless tobacco. He reports that he does not drink alcohol and does not use drugs.  Allergies  Allergen Reactions   Amoxicillin Anaphylaxis and Swelling    Tolerates Rocephin   Fosinopril Other (See Comments) and Cough    Muscle aches (Monopril)   Hydromorphone Nausea And Vomiting   Ampicillin Rash   Codeine Nausea Only and Other (See Comments)    Heavy amounts cause nausea and stomach upset   Rosuvastatin Other (See Comments)    Joint pain    Family History  Problem Relation Age of Onset   Hypertension Mother    Heart disease Father    Heart attack Father    Prostate cancer Brother 54   Parkinson's disease Brother    Lung cancer Paternal Aunt        hx of smoking   Breast cancer Paternal Grandmother        dx 33s, bilateral mastectomies   Heart attack Paternal Grandfather    Blindness Son    Colon cancer Neg Hx    Pancreatic cancer Neg Hx     Prior to Admission medications   Medication Sig Start Date End Date Taking? Authorizing Provider  acetaminophen (TYLENOL) 325 MG tablet Take 2 tablets (650 mg total) by mouth every 6 (six) hours as needed for mild pain (or Fever >/= 101). 08/25/21  Yes Angiulli, Lavon Paganini, PA-C  albuterol (PROVENTIL) (2.5 MG/3ML) 0.083% nebulizer solution Inhale 3 mLs into the lungs  every 4 (four) hours as needed for wheezing or shortness of breath. Patient taking differently: Inhale 3 mLs into the lungs See admin instructions. Nebulize 2.5 mg & inhale into the lungs 3-4 times a day and may also use an additional 2.5 mg up to two times a day as needed for shortness of breath 08/28/21  Yes Angiulli, Lavon Paganini, PA-C  ALPRAZolam Duanne Moron) 0.5 MG tablet Take 1 tablet (0.5 mg total) by mouth at bedtime as needed for anxiety. 11/19/21  Yes Shelly Coss, MD  amiodarone (PACERONE) 200 MG tablet Take 1 tablet (200 mg total) by mouth daily for 6 days. 11/21/21 11/27/21 Yes Shelly Coss, MD  atorvastatin (LIPITOR) 40 MG tablet Take 1 tablet (  40 mg total) by mouth daily. Patient taking differently: Take 40 mg by mouth at bedtime. 08/25/21  Yes Angiulli, Lavon Paganini, PA-C  benzonatate (TESSALON) 200 MG capsule Take 1 capsule (200 mg total) by mouth 3 (three) times daily. Patient taking differently: Take 200 mg by mouth in the morning, at noon, and at bedtime. 10/07/21  Yes Heilingoetter, Cassandra L, PA-C  budesonide (PULMICORT) 0.5 MG/2ML nebulizer solution Take 2 mLs (0.5 mg total) by nebulization 2 (two) times daily. Patient taking differently: Take 0.5 mg by nebulization in the morning and at bedtime. 09/25/21  Yes Icard, Bradley L, DO  carbidopa-levodopa (SINEMET IR) 25-100 MG tablet Take 1 tablet by mouth 3 (three) times daily. Patient taking differently: Take 1 tablet by mouth See admin instructions. Take 1 tablet by mouth at 8 AM, 1 PM, 7 PM, and bedtime (10 PM) 07/09/21  Yes Tat, Eustace Quail, DO  Dextromethorphan-Menthol (DELSYM COUGH RELIEF MT) Use as directed 10 mLs in the mouth or throat at bedtime as needed (for cough).   Yes [provider]  diltiazem (CARDIZEM CD) 120 MG 24 hr capsule Take 1 capsule (120 mg total) by mouth daily. 11/21/21  Yes Shelly Coss, MD  fenofibrate 160 MG tablet TAKE 1 TABLET DAILY. PLEASE KEEP UPCOMING APPT IN MARCH WITH DR. Acie Fredrickson BEFORE ANYMORE  REFILLS. Patient taking differently: Take 160 mg by mouth See admin instructions. Take 160 mg by mouth at 2 PM daily 08/25/21  Yes Angiulli, Lavon Paganini, PA-C  fluticasone (FLONASE) 50 MCG/ACT nasal spray Place 2 sprays into both nostrils daily. 08/25/21  Yes Angiulli, Lavon Paganini, PA-C  folic acid (FOLVITE) 1 MG tablet Take 1 tablet (1 mg total) by mouth daily. 08/25/21  Yes Angiulli, Lavon Paganini, PA-C  guaiFENesin (MUCINEX) 600 MG 12 hr tablet Take 2 tablets (1,200 mg total) by mouth 2 (two) times daily. 08/12/21  Yes Kathie Dike, MD  ipratropium (ATROVENT) 0.03 % nasal spray Place 1-2 sprays into both nostrils every 12 (twelve) hours. For drainage Patient taking differently: Place 2 sprays into both nostrils in the morning. 08/28/21  Yes Angiulli, Lavon Paganini, PA-C  levalbuterol (XOPENEX) 0.63 MG/3ML nebulizer solution Take 3 mLs (0.63 mg total) by nebulization 2 (two) times daily. 09/25/21  Yes Icard, Octavio Graves, DO  loratadine (CLARITIN) 10 MG tablet Take 1 tablet (10 mg total) by mouth daily as needed for allergies (nasal drainage). Patient taking differently: Take 10 mg by mouth daily as needed for allergies. 08/25/21  Yes Angiulli, Lavon Paganini, PA-C  magnesium oxide (MAG-OX) 400 (240 Mg) MG tablet Take 1 tablet (400 mg total) by mouth daily. 08/25/21  Yes Angiulli, Lavon Paganini, PA-C  metoprolol succinate (TOPROL-XL) 25 MG 24 hr tablet Take 1 tablet (25 mg total) by mouth daily. 08/25/21  Yes Angiulli, Lavon Paganini, PA-C  Multiple Vitamins-Minerals (MULTIVITAMINS THER. W/MINERALS) TABS Take 1 tablet by mouth daily with breakfast.   Yes [provider]  omeprazole (PRILOSEC) 20 MG capsule Take 1 capsule (20 mg total) by mouth daily. Patient taking differently: Take 20 mg by mouth daily before breakfast. 08/25/21  Yes Angiulli, Lavon Paganini, PA-C  ondansetron (ZOFRAN) 8 MG tablet Take 8 mg by mouth every 8 (eight) hours as needed for nausea or vomiting.   Yes [provider]  polyethylene glycol  (MIRALAX / GLYCOLAX) 17 g packet Take 17 g by mouth 2 (two) times daily. Patient taking differently: Take 17 g by mouth daily. 11/20/21  Yes Shelly Coss, MD  potassium chloride (KLOR-CON) 10  MEQ tablet TAKE 1 TABLET BY MOUTH EVERY DAY Patient taking differently: Take 10 mEq by mouth See admin instructions. Take 10 mEq by mouth at 2 PM daily 07/16/21  Yes Nahser, Wonda Cheng, MD  predniSONE (DELTASONE) 10 MG tablet Take 3 tablets (30 mg total) by mouth daily with breakfast for 3 days. 11/21/21 11/24/21 Yes Shelly Coss, MD  rivaroxaban (XARELTO) 20 MG TABS tablet Take 1 tablet (20 mg total) by mouth daily. Patient taking differently: Take 20 mg by mouth every evening. 08/25/21  Yes Angiulli, Daniel J, PA-C  RYVENT 6 MG TABS TAKE 1 TABLET BY MOUTH 2 (TWO) TIMES DAILY AS NEEDED (DRAINAGE). Patient taking differently: Take 6 mg by mouth in the morning and at bedtime. 10/29/21  Yes Garnet Sierras, DO  Saccharomyces boulardii (PROBIOTIC) 250 MG CAPS Take 250 mg by mouth daily.   Yes [provider]  senna-docusate (SENOKOT-S) 8.6-50 MG tablet Take 1 tablet by mouth 2 (two) times daily. 11/20/21  Yes Shelly Coss, MD  tamsulosin (FLOMAX) 0.4 MG CAPS capsule Take 1 capsule (0.4 mg total) by mouth daily after supper. 08/25/21  Yes Angiulli, Lavon Paganini, PA-C  Tiotropium Bromide-Olodaterol (STIOLTO RESPIMAT) 2.5-2.5 MCG/ACT AERS Inhale 2 puffs into the lungs daily. 09/25/21  Yes Icard, Bradley L, DO  topiramate (TOPAMAX) 50 MG tablet Take 2 tablets (100 mg total) by mouth 2 (two) times daily. Patient taking differently: Take 50-100 mg by mouth See admin instructions. Take 50mg  by mouth in the morning, then 100mg  at night 08/25/21  Yes Angiulli, Lavon Paganini, PA-C  Vitamin D, Ergocalciferol, (DRISDOL) 1.25 MG (50000 UNIT) CAPS capsule Take 1 capsule (50,000 Units total) by mouth every 7 (seven) days. Monday Patient taking differently: Take 50,000 Units by mouth every Monday. 10/05/21  Yes Brunetta Genera,  MD  nitroGLYCERIN (NITROSTAT) 0.4 MG SL tablet Place 1 tablet (0.4 mg total) under the tongue every 5 (five) minutes as needed for chest pain. Patient not taking: Reported on 11/21/2021 08/25/21   Angiulli, Lavon Paganini, PA-C  predniSONE (DELTASONE) 10 MG tablet Take 1 tablet (10 mg total) by mouth daily with breakfast for 3 days. 12/01/21 12/04/21  Shelly Coss, MD  predniSONE (DELTASONE) 20 MG tablet Take 1 tablet (20 mg total) by mouth daily with breakfast for 3 days. 11/24/21 11/27/21  Shelly Coss, MD  predniSONE (DELTASONE) 5 MG tablet Take 1 tablet (5 mg total) by mouth daily with breakfast for 3 days. 12/08/21 12/11/21  Shelly Coss, MD  traMADol (ULTRAM) 50 MG tablet Take 1 tablet (50 mg total) by mouth every 6 (six) hours as needed for moderate pain. Patient not taking: Reported on 11/21/2021 11/19/21   Shelly Coss, MD    Physical Exam: Vitals:   11/21/21 1130 11/21/21 1200 11/21/21 1219 11/21/21 1330  BP: 122/90 (!) 136/95  (!) 98/55  Pulse: 63 (!) 55 64 84  Resp: (!) 22 18 20  (!) 23  Temp:      TempSrc:      SpO2: 96% 100% 100% 98%  Weight:      Height:       General: 76 y.o. male resting in bed in NAD Eyes: PERRL, normal sclera ENMT: Nares patent w/o discharge, orophaynx clear, dentition normal, ears w/o discharge/lesions/ulcers Neck: Supple, trachea midline Cardiovascular: irregular, +S1, S2, no m/g/r, equal pulses throughout Respiratory: soft rhonchi on right, no w/r, slightly increased WOB GI: BS+, NDNT, no masses noted, no organomegaly noted MSK: No e/c/c Neuro: A&O x 3, no focal deficits Psyc: Appropriate  interaction and affect, calm/cooperative   Data Reviewed:  WBC 11.6 Hgb 9.4  CTA chest: 1. No CT evidence for acute pulmonary embolus. 2. Enlargement of the pulmonary outflow tract/main pulmonary arteries suggests pulmonary arterial hypertension. 3. Marked volume loss with extensive pleuroparenchymal scarring in the left hemithorax, presumably treatment  related although there is progressive consolidative opacity and volume loss at the left base. Features may be related to infection/inflammation although disease progression could certainly have this appearance. 4. Interval increase in interstitial and ill-defined ground-glass opacity in the posterior right lower lobe with patchy areas of subtle ground-glass opacity in the right upper lobe. Findings likely reflect multifocal right-sided pneumonia. 5. Small right pleural effusion, new in the interval. 6. Interval progression of multiple ill-defined liver lesions consistent with metastatic disease. 7. Aortic Atherosclerosis (ICD10-I70.0).  Assessment and Plan: A fib RVR     - admit to inpt, progressive     - continue dilt gtt     - continue xarelto     - cards onboard, appreciate assistance     - increase PO dilt to 240 per cards rec, continue BB, amiodarone as is   Multifocal PNA     - continue vanc, cefepime     - check MRSA swab; check RVP     - he was treated for LLL PNA during his last admission with cefepime for 7 days; no CT done at that time; CTA today shows a multifocal right side PNA     - question if there may be some silent aspiration; will have SLP eval  S4 NSCLC w/ mets     - follows w/ Dr. [name]     - consult palliative care for goals of care  COPD Chronic hypoxic respiratory failure OSA on CPAP     - continue home regimen  HTN     - continue home regimen  CAD HLD     - continue statin, BB  GERD     - continue PPI    Advance Care Planning:   Code Status: FULL  Consults: EDP spoke with cardiology; consult palliative care  Family Communication: w/ wife at bedside  Severity of Illness: The appropriate patient status for this patient is INPATIENT. Inpatient status is judged to be reasonable and necessary in order to provide the required intensity of service to ensure the patient's safety. The patient's presenting symptoms, physical exam findings, and initial  radiographic and laboratory data in the context of their chronic comorbidities is felt to place them at high risk for further clinical deterioration. Furthermore, it is not anticipated that the patient will be medically stable for discharge from the hospital within 2 midnights of admission.   * I certify that at the point of admission it is my clinical judgment that the patient will require inpatient hospital care spanning beyond 2 midnights from the point of admission due to high intensity of service, high risk for further deterioration and high frequency of surveillance required.*  Author: Jonnie Finner, DO 11/21/2021 2:11 PM  For on call review [email or web address].

## 2021-11-21 NOTE — Assessment & Plan Note (Addendum)
-   Statin discontinued per cardiology due to lack of mortality benefit at this time - Continue Toprol - Continue Xarelto

## 2021-11-21 NOTE — Assessment & Plan Note (Addendum)
-   Continue Cardizem and Toprol

## 2021-11-21 NOTE — ED Notes (Signed)
Pt got a condom catheter

## 2021-11-21 NOTE — Assessment & Plan Note (Signed)
Is on Xarelto, amiodarone, Cardizem, Toprol

## 2021-11-21 NOTE — ED Notes (Signed)
Pt got hid lunch tray

## 2021-11-21 NOTE — Progress Notes (Signed)
A consult was received from an ED physician for vancomycin and cefepime per pharmacy dosing.  The patient's profile has been reviewed for ht/wt/allergies/indication/available labs.    Pt has amoxicillin allergy listed on profile, but has tolerated cephalosporins in the past. Has received cefepime previously, most recently on 11/03/21.  A one time order has been placed for vancomycin 2000 mg + cefepime 2 g IV once.    Further antibiotics/pharmacy consults should be ordered by admitting physician if indicated.                       Thank you, Lenis Noon, PharmD 11/21/2021  12:43 PM

## 2021-11-22 DIAGNOSIS — J9611 Chronic respiratory failure with hypoxia: Secondary | ICD-10-CM | POA: Diagnosis not present

## 2021-11-22 DIAGNOSIS — J189 Pneumonia, unspecified organism: Secondary | ICD-10-CM

## 2021-11-22 DIAGNOSIS — Z9981 Dependence on supplemental oxygen: Secondary | ICD-10-CM | POA: Diagnosis not present

## 2021-11-22 LAB — COMPREHENSIVE METABOLIC PANEL
ALT: 11 U/L (ref 0–44)
AST: 16 U/L (ref 15–41)
Albumin: 3 g/dL — ABNORMAL LOW (ref 3.5–5.0)
Alkaline Phosphatase: 55 U/L (ref 38–126)
Anion gap: 8 (ref 5–15)
BUN: 25 mg/dL — ABNORMAL HIGH (ref 8–23)
CO2: 25 mmol/L (ref 22–32)
Calcium: 8.7 mg/dL — ABNORMAL LOW (ref 8.9–10.3)
Chloride: 102 mmol/L (ref 98–111)
Creatinine, Ser: 0.87 mg/dL (ref 0.61–1.24)
GFR, Estimated: >60 mL/min (ref >60)
Glucose, Bld: 124 mg/dL — ABNORMAL HIGH (ref 70–99)
Potassium: 4.1 mmol/L (ref 3.5–5.1)
Sodium: 135 mmol/L (ref 135–145)
Total Bilirubin: 0.1 mg/dL — ABNORMAL LOW (ref 0.3–1.2)
Total Protein: 6.3 g/dL — ABNORMAL LOW (ref 6.5–8.1)

## 2021-11-22 LAB — CBC
HCT: 28.5 % — ABNORMAL LOW (ref 39.0–52.0)
Hemoglobin: 9 g/dL — ABNORMAL LOW (ref 13.0–17.0)
MCH: 34.6 pg — ABNORMAL HIGH (ref 26.0–34.0)
MCHC: 31.6 g/dL (ref 30.0–36.0)
MCV: 109.6 fL — ABNORMAL HIGH (ref 80.0–100.0)
Platelets: 269 10*3/uL (ref 150–400)
RBC: 2.6 MIL/uL — ABNORMAL LOW (ref 4.22–5.81)
RDW: 17.9 % — ABNORMAL HIGH (ref 11.5–15.5)
WBC: 10.3 10*3/uL (ref 4.0–10.5)
nRBC: 0 % (ref 0.0–0.2)

## 2021-11-22 MED ORDER — CHLORHEXIDINE GLUCONATE CLOTH 2 % EX PADS
6.0000 | MEDICATED_PAD | Freq: Every day | CUTANEOUS | Status: DC
  Administered 2021-11-22 – 2021-11-25 (×4): 6 via TOPICAL

## 2021-11-22 MED ORDER — CHLORHEXIDINE GLUCONATE 0.12 % MT SOLN
15.0000 mL | Freq: Two times a day (BID) | OROMUCOSAL | Status: DC
  Administered 2021-11-22 – 2021-11-26 (×10): 15 mL via OROMUCOSAL
  Filled 2021-11-22 (×11): qty 15

## 2021-11-22 MED ORDER — ORAL CARE MOUTH RINSE
15.0000 mL | Freq: Two times a day (BID) | OROMUCOSAL | Status: DC
  Administered 2021-11-22 – 2021-11-25 (×8): 15 mL via OROMUCOSAL

## 2021-11-22 MED ORDER — DILTIAZEM HCL ER COATED BEADS 180 MG PO CP24
360.0000 mg | ORAL_CAPSULE | Freq: Every day | ORAL | Status: DC
  Administered 2021-11-22 – 2021-11-26 (×5): 360 mg via ORAL
  Filled 2021-11-22 (×5): qty 2

## 2021-11-22 MED ORDER — SODIUM CHLORIDE 0.9% FLUSH
10.0000 mL | INTRAVENOUS | Status: DC | PRN
  Administered 2021-11-26: 10 mL

## 2021-11-22 NOTE — Assessment & Plan Note (Addendum)
-   s/p cardizem drip on admission; was able to be titrated off - now on diltiazem 360 mg daily, amiodarone 200 mg daily, Toprol 25 mg daily (if adjustment needed, could try increasing Toprol further if BP able to tolerate) -TSH normal and baseline LFTs have been obtained per cardiology recommendations - amiodarone to continue likely indefinitely unless severe thyroid issues or transaminitis -Outpatient follow-up with A-fib clinic on 12/02/2021.  Cardiology has signed off. -Continue Xarelto

## 2021-11-22 NOTE — Assessment & Plan Note (Addendum)
-   New right-sided infiltrates noted on CTA - s/p vancomycin and cefepime on admission; previously MRSA negative; -Currently on cefepime.  Switch to oral Ceftin for 2 more days on discharge. -Discharge to SNF today.

## 2021-11-22 NOTE — Progress Notes (Signed)
Progress Note   Patient: Corey [name] WYO:[id number] DOB: [birth year] DOA: 11/21/2021     1 DOS: the patient was seen and examined on 11/22/2021   Brief hospital course: KONNOR VONDRASEK is a 76 y.o. male with PMH stage IV NSCLC with liver mets, PAF, CAD, chronic low back pain, anemia, anxiety, osteoarthritis, HLD, HTN, GERD, OSA on CPAP, prostate cancer.  He was recently discharged to rehab but after arriving there were issues with his O2 concentrator and he was sent back to the hospital. He remains on 4L chronic oxygen.  On presentation he had also redeveloped A-fib with RVR.  Lung imaging also revealed concern for new right-sided pneumonia and he was started on antibiotics.  Cardiology was also consulted to assist with his RVR as he was started on a Cardizem drip on admission.  Assessment and Plan: * Chronic respiratory failure with hypoxia - On 4 L chronically -Will need to make sure facility able to accommodate his concentrator prior to discharging back  Atrial fibrillation with RVR ([place])- (present on admission) - Cardiology following, appreciate assistance - Currently on Cardizem drip with plans for titrating off as able.  Diltiazem adjusted per cardiology - Continue Toprol and Xarelto - No longer on Tikosyn as was stopped last hospitalization   CAP (community acquired pneumonia) - New right-sided infiltrates noted on CTA - Continue vancomycin and cefepime  AF (paroxysmal atrial fibrillation) (HCC) Is on Xarelto, amiodarone, Cardizem, Toprol  Non-small cell carcinoma of left lung, stage 4 ([place])- (present on admission) Stage IV lung cancer.  Patient followed by oncology, Dr. [name]  DNR (do not resuscitate) Verified DNR status.  COPD (chronic obstructive pulmonary disease) (HCC) Continue prednisone taper.  Supplemental oxygen dependent Continue supplemental oxygen at 4 L a minute.  Physical deconditioning- (present on admission) - Plan is to return to rehab at  discharge  Secondary hypercoagulable state ([place]) Continue Xarelto  Morbid obesity ([place])- (present on admission) Chronic  Essential hypertension Continue home blood pressure medication  Dyslipidemia- (present on admission) - Statin discontinued due to lack of long-term benefit at this time  Benign essential HTN- (present on admission) - Continue Cardizem and Toprol -Watch BP as was on the soft side last hospitalization  Coronary artery disease- (present on admission) - Statin discontinued per cardiology due to lack of mortality benefit at this time - Continue Toprol - Continue Xarelto    Subjective: Patient known to me from last hospitalization.  Energy level appeared much improved and he was on his way to rehab when he was sent back to the hospital due to oxygen concentrator issues.  He is feeling relatively okay this morning.  Main goal is trying to get him off of Cardizem drip at this time while we treat underlying presumed pneumonia again.  Physical Exam: Vitals:   11/21/21 2050 11/21/21 2358 11/22/21 0619 11/22/21 0823  BP: 102/61 101/67 104/63   Pulse: 64 85 64   Resp: 18 16 20    Temp: 98.2 F (36.8 C) (!) 97.5 F (36.4 C) 97.8 F (36.6 C)   TempSrc: Oral Oral    SpO2: 100% 100% 99% 97%  Weight:      Height:       Physical Exam Constitutional:      General: He is not in acute distress.    Appearance: He is well-developed.  HENT:     Head: Normocephalic and atraumatic.     Mouth/Throat:     Mouth: Mucous membranes are moist.  Eyes:  Extraocular Movements: Extraocular movements intact.  Cardiovascular:     Rate and Rhythm: Normal rate. Rhythm irregular.  Pulmonary:     Comments: Mild coarse breath sounds bilaterally no wheezing Abdominal:     General: Bowel sounds are normal. There is no distension.     Palpations: Abdomen is soft.     Tenderness: There is no abdominal tenderness.  Musculoskeletal:        General: Normal range of motion.      Cervical back: Normal range of motion and neck supple.  Skin:    General: Skin is dry.  Neurological:     General: No focal deficit present.     Mental Status: He is alert.  Psychiatric:        Mood and Affect: Mood normal.        Behavior: Behavior normal.    Data Reviewed:  Results for orders placed or performed during the hospital encounter of 11/21/21 (from the past 24 hour(s))  Troponin I (High Sensitivity)     Status: None   Collection Time: 11/21/21  1:38 PM  Result Value Ref Range   Troponin I (High Sensitivity) 8 <18 ng/L  Respiratory (~20 pathogens) panel by PCR     Status: None   Collection Time: 11/21/21  3:26 PM   Specimen: Nasopharyngeal Swab; Respiratory  Result Value Ref Range   Adenovirus NOT DETECTED NOT DETECTED   Coronavirus 229E NOT DETECTED NOT DETECTED   Coronavirus HKU1 NOT DETECTED NOT DETECTED   Coronavirus NL63 NOT DETECTED NOT DETECTED   Coronavirus OC43 NOT DETECTED NOT DETECTED   Metapneumovirus NOT DETECTED NOT DETECTED   Rhinovirus / Enterovirus NOT DETECTED NOT DETECTED   Influenza A NOT DETECTED NOT DETECTED   Influenza B NOT DETECTED NOT DETECTED   Parainfluenza Virus 1 NOT DETECTED NOT DETECTED   Parainfluenza Virus 2 NOT DETECTED NOT DETECTED   Parainfluenza Virus 3 NOT DETECTED NOT DETECTED   Parainfluenza Virus 4 NOT DETECTED NOT DETECTED   Respiratory Syncytial Virus NOT DETECTED NOT DETECTED   Bordetella pertussis NOT DETECTED NOT DETECTED   Bordetella Parapertussis NOT DETECTED NOT DETECTED   Chlamydophila pneumoniae NOT DETECTED NOT DETECTED   Mycoplasma pneumoniae NOT DETECTED NOT DETECTED  Comprehensive metabolic panel     Status: Abnormal   Collection Time: 11/22/21  2:00 AM  Result Value Ref Range   Sodium 135 135 - 145 mmol/L   Potassium 4.1 3.5 - 5.1 mmol/L   Chloride 102 98 - 111 mmol/L   CO2 25 22 - 32 mmol/L   Glucose, Bld 124 (H) 70 - 99 mg/dL   BUN 25 (H) 8 - 23 mg/dL   Creatinine, Ser 0.87 0.61 - 1.24 mg/dL    Calcium 8.7 (L) 8.9 - 10.3 mg/dL   Total Protein 6.3 (L) 6.5 - 8.1 g/dL   Albumin 3.0 (L) 3.5 - 5.0 g/dL   AST 16 15 - 41 U/L   ALT 11 0 - 44 U/L   Alkaline Phosphatase 55 38 - 126 U/L   Total Bilirubin 0.1 (L) 0.3 - 1.2 mg/dL   GFR, Estimated >60 >60 mL/min   Anion gap 8 5 - 15  CBC     Status: Abnormal   Collection Time: 11/22/21  2:00 AM  Result Value Ref Range   WBC 10.3 4.0 - 10.5 K/uL   RBC 2.60 (L) 4.22 - 5.81 MIL/uL   Hemoglobin 9.0 (L) 13.0 - 17.0 g/dL   HCT 28.5 (L) 39.0 - 52.0 %  MCV 109.6 (H) 80.0 - 100.0 fL   MCH 34.6 (H) 26.0 - 34.0 pg   MCHC 31.6 30.0 - 36.0 g/dL   RDW 17.9 (H) 11.5 - 15.5 %   Platelets 269 150 - 400 K/uL   nRBC 0.0 0.0 - 0.2 %    I have Reviewed nursing notes, Vitals, and Lab results since pt's last encounter. Pertinent lab results : see above I have ordered test including BMP, CBC, Mg I have reviewed the last note from staff over past 24 hours I have discussed pt's care plan and test results with nursing staff, case manager  Antimicrobials: Vancomycin 11/21/2021 >> current Cefepime 11/21/2021 >> current  Consultants: Cardiology  Procedures:    DVT ppx:   rivaroxaban (XARELTO) tablet 20 mg     Code Status: Full Code   Family Communication:   Disposition: Status is: Inpatient Remains inpatient appropriate because: Treatment as outlined in A&P    Planned Discharge Destination: Skilled nursing facility   Author: Dwyane Dee, MD 11/22/2021 11:44 AM  For on call review [email or web address].

## 2021-11-22 NOTE — Progress Notes (Signed)
Progress Note  Patient Name: Corey [name] Date of Encounter: 11/22/2021  Primary Cardiologist: Mertie Moores, MD   Subjective   Ended up getting admitted 11/21/21. Patient notes that he is looking forward to rehab, but wants to make sure he is ready to go. We discussed his long term prognosis, AFL ablation, and reviewed his medication.  Inpatient Medications    Scheduled Meds:  amiodarone  200 mg Oral QHS   arformoterol  15 mcg Nebulization BID   And   umeclidinium bromide  1 puff Inhalation Daily   atorvastatin  40 mg Oral QHS   benzonatate  200 mg Oral TID   budesonide  0.5 mg Nebulization BID   carbidopa-levodopa  1 tablet Oral QID   Carbinoxamine Maleate  6 mg Oral BID   chlorhexidine  15 mL Mouth Rinse BID   Chlorhexidine Gluconate Cloth  6 each Topical Daily   diltiazem  240 mg Oral Daily   fenofibrate  160 mg Oral Daily   fluticasone  2 spray Each Nare Daily   folic acid  1 mg Oral Daily   guaiFENesin  1,200 mg Oral BID   ipratropium  1-2 spray Each Nare Q12H   levalbuterol  0.63 mg Nebulization BID   loratadine  10 mg Oral Daily   magnesium oxide  400 mg Oral Daily   mouth rinse  15 mL Mouth Rinse q12n4p   metoprolol succinate  25 mg Oral Daily   multivitamin with minerals  1 tablet Oral Daily   pantoprazole  40 mg Oral Daily   polyethylene glycol  17 g Oral BID   potassium chloride  10 mEq Oral Daily   predniSONE  30 mg Oral Q breakfast   rivaroxaban  20 mg Oral QPM   saccharomyces boulardii  250 mg Oral Daily   senna-docusate  1 tablet Oral BID   tamsulosin  0.4 mg Oral QPC supper   topiramate  100 mg Oral BID   Continuous Infusions:  ceFEPime (MAXIPIME) IV 2 g (11/22/21 0906)   diltiazem (CARDIZEM) infusion 10 mg/hr (11/22/21 0908)   PRN Meds: acetaminophen, albuterol, ALPRAZolam, ondansetron, sodium chloride flush, traMADol   Vital Signs    Vitals:   11/21/21 2050 11/21/21 2358 11/22/21 0619 11/22/21 0823  BP: 102/61 101/67 104/63   Pulse: 64  85 64   Resp: 18 16 20    Temp: 98.2 F (36.8 C) (!) 97.5 F (36.4 C) 97.8 F (36.6 C)   TempSrc: Oral Oral    SpO2: 100% 100% 99% 97%  Weight:      Height:        Intake/Output Summary (Last 24 hours) at 11/22/2021 3016 Last data filed at 11/22/2021 0109 Gross per 24 hour  Intake 261.19 ml  Output 2000 ml  Net -1738.81 ml   Filed Weights   11/21/21 0217  Weight: 104.9 kg    Telemetry    Rate controled AFL 3:1 - Personally Reviewed   Physical Exam   Gen: no distress Neck: No JVD Cardiac: No Rubs or Gallops, RRR Respiratory: Coarse breath sounds bilaterally GI: Soft, nontender, non-distended  MS: No  edema;  moves all extremities Integument: Skin feels warm Neuro:  At time of evaluation, alert and oriented to person/place/time/situation  Psych: Normal affect, patient feels [facility]   Labs    Chemistry Recent Labs  Lab 11/18/21 0329 11/21/21 0937 11/22/21 0200  NA 135 133* 135  K 4.0 3.9 4.1  CL 100 100 102  CO2 27 26  25  GLUCOSE 84 103* 124*  BUN 25* 24* 25*  CREATININE 0.71 0.78 0.87  CALCIUM 8.7* 9.0 8.7*  PROT 5.9* 6.6 6.3*  ALBUMIN 2.8* 3.1* 3.0*  AST 21 18 16   ALT 12 7 11   ALKPHOS 63 56 55  BILITOT 0.2* 0.3 0.1*  GFRNONAA >60 >60 >60  ANIONGAP 8 7 8      Hematology Recent Labs  Lab 11/18/21 0329 11/21/21 0937 11/22/21 0200  WBC 9.7 11.6* 10.3  RBC 2.58* 2.76* 2.60*  HGB 8.8* 9.4* 9.0*  HCT 27.9* 30.1* 28.5*  MCV 108.1* 109.1* 109.6*  MCH 34.1* 34.1* 34.6*  MCHC 31.5 31.2 31.6  RDW 16.6* 18.0* 17.9*  PLT 232 266 269    Cardiac EnzymesNo results for input(s): TROPONINI in the last 168 hours. No results for input(s): TROPIPOC in the last 168 hours.   BNP Recent Labs  Lab 11/21/21 0949  BNP 161.0*     DDimer No results for input(s): DDIMER in the last 168 hours.   Radiology    CT Angio Chest PE W and/or Wo Contrast  Result Date: 11/21/2021 CLINICAL DATA:  Hypoxia.  Shortness of breath.  PE suspected. EXAM: CT ANGIOGRAPHY CHEST  WITH CONTRAST TECHNIQUE: Multidetector CT imaging of the chest was performed using the standard protocol during bolus administration of intravenous contrast. Multiplanar CT image reconstructions and MIPs were obtained to evaluate the vascular anatomy. RADIATION DOSE REDUCTION: This exam was performed according to the departmental dose-optimization program which includes automated exposure control, adjustment of the mA and/or kV according to patient size and/or use of iterative reconstruction technique. CONTRAST:  53mL OMNIPAQUE IOHEXOL 350 MG/ML SOLN COMPARISON:  10/15/2021 FINDINGS: Cardiovascular: Heart is enlarged. Coronary artery calcification is evident. Mild atherosclerotic calcification is noted in the wall of the thoracic aorta. Enlargement of the pulmonary outflow tract/main pulmonary arteries suggests pulmonary arterial hypertension. There is no filling defect within the opacified pulmonary arteries to suggest the presence of an acute pulmonary embolus. Mediastinum/Nodes: No mediastinal lymphadenopathy. There is no hilar lymphadenopathy. The esophagus has normal imaging features. There is no axillary lymphadenopathy. Lungs/Pleura: Marked volume loss with extensive pleuroparenchymal scarring in the left hemithorax is similar to prior, presumably treatment related although there is progressive consolidative opacity and volume loss at the left base. Interval increase in interstitial and ill-defined ground-glass opacity in the posterior right lower lobe with patchy areas of subtle ground-glass opacity in the right upper lobe. Small right pleural effusion is new in the interval. Upper Abdomen: Multiple ill-defined liver lesions again noted. Caudate lobe lesion appears progressive in the interval measuring 5.7 x 5.6 cm today compared to 4.1 x 3.2 cm previously. 1.4 cm lesion in the inferior right liver measured previously is 2.3 cm today on 283/6. 2.2 cm lesion measured in the dome of the liver on the prior  study is 3.5 cm today on image 219/6. Musculoskeletal: No worrisome lytic or sclerotic osseous abnormality. Review of the MIP images confirms the above findings. IMPRESSION: 1. No CT evidence for acute pulmonary embolus. 2. Enlargement of the pulmonary outflow tract/main pulmonary arteries suggests pulmonary arterial hypertension. 3. Marked volume loss with extensive pleuroparenchymal scarring in the left hemithorax, presumably treatment related although there is progressive consolidative opacity and volume loss at the left base. Features may be related to infection/inflammation although disease progression could certainly have this appearance. 4. Interval increase in interstitial and ill-defined ground-glass opacity in the posterior right lower lobe with patchy areas of subtle ground-glass opacity in the right upper lobe.  Findings likely reflect multifocal right-sided pneumonia. 5. Small right pleural effusion, new in the interval. 6. Interval progression of multiple ill-defined liver lesions consistent with metastatic disease. 7. Aortic Atherosclerosis (ICD10-I70.0). Electronically Signed   By: Misty Stanley M.D.   On: 11/21/2021 10:31      Assessment & Plan    #Atrial fibrillation/AFL with RVR #Possible CAP vs pneumonitis #Lung cancer stage IV # HFpEF # CAD with prior PCI # RV Dysfunction # Malnutrition - working to wean diltiazem drip, increase PO to  360mg  daily - continue amiodarone 200 mg PO daily- see prior notes for risk benefit discussion - Continue xarelto for [facility] - Continue metoprolol 25 - re viewed statin indication, will DC give long term prognosis - has tolerated DOAC- since at least 11/04/21; if refractory sx can trial DCCV after 11/25/21 - Long term I do not thing AFL ablation would be the best thing for this patient given his comorbditidies  For questions or updates, please contact [place] Please consult [email or web address] for contact info under Cardiology/STEMI.       Signed, Werner Lean, MD  11/22/2021, 9:27 AM

## 2021-11-22 NOTE — Plan of Care (Signed)
°  Problem: Education: Goal: Knowledge of disease or condition will improve Outcome: Progressing   Problem: Cardiac: Goal: Ability to achieve and maintain adequate cardiopulmonary perfusion will improve Outcome: Progressing   Problem: Education: Goal: Knowledge of General Education information will improve Description: Including pain rating scale, medication(s)/side effects and non-pharmacologic comfort measures Outcome: Progressing   Problem: Nutrition: Goal: Adequate nutrition will be maintained Outcome: Progressing

## 2021-11-22 NOTE — Hospital Course (Addendum)
76 y.o. male with PMH stage IV NSCLC with liver mets, PAF, CAD, chronic low back pain, anemia, anxiety, osteoarthritis, HLD, HTN, GERD, OSA on CPAP, prostate cancer.  He was recently discharged to rehab but after arriving, there were issues with his O2 concentrator and he was sent back to the hospital. He remains on 4L chronic oxygen.  On presentation he had also redeveloped A-fib with RVR.  Lung imaging also revealed concern for new right-sided pneumonia and he was started on antibiotics.  Cardiology was also consulted to assist with his RVR as he was started on a Cardizem drip on admission.  Subsequently, his condition has stabilized and he is currently stable to be discharged back to SNF.  He will be discharged to SNF once bed is available.

## 2021-11-22 NOTE — Evaluation (Signed)
Clinical/Bedside Swallow Evaluation Patient Details  Name: Corey [name] MRN: [id number] Date of Birth: [birth year]  Today's Date: 11/22/2021 Time: SLP Start Time (ACUTE ONLY): 27 SLP Stop Time (ACUTE ONLY): 1340 SLP Time Calculation (min) (ACUTE ONLY): 20 min  Past Medical History:  Past Medical History:  Diagnosis Date   Anemia    Anxiety    Arthritis    "back, right knee, hands, ankles, neck" (05/31/2016)   Atrial fibrillation with RVR ([place]) 11/03/2021   Carotid artery disease (HCC)    Chronic lower back pain    COPD with acute exacerbation ([place]) 04/02/2021   Coronary atherosclerosis of native coronary artery    a. BMS to [facility] 2004 and 2007, otherwise mild nonobstructive disease. EF normal.   Diverticulitis    Dyslipidemia    Essential hypertension, benign    Family history of breast cancer    Family history of lung cancer    Family history of prostate cancer    GERD (gastroesophageal reflux disease)    Hyponatremia 06/04/2016   Lumbar radiculopathy, chronic 02/04/2015   Right L5   Lung cancer ([place])    Mild dilation of ascending aorta ([place]) 08/2021   Obesity    OSA on CPAP    uses cpap   Parkinson's disease ([place])    Paroxysmal atrial fibrillation ([place])    a. Discovered after stroke.   Pleural effusion on left 02/24/2021   Pneumonia 01/1996   PONV (postoperative nausea and vomiting)    Prostate CA (HCC)    Recurrent upper respiratory infection (URI)    Stroke ([place]) 07/2012   no deficits   Symptomatic anemia    Visit for monitoring Tikosyn therapy 09/18/2019   Past Surgical History:  Past Surgical History:  Procedure Laterality Date   ADENOIDECTOMY     ANTERIOR CERVICAL DECOMP/DISCECTOMY FUSION  07/2001; 10/2002   "C5-6; C6-7; redo"   BACK SURGERY     CARPAL TUNNEL RELEASE Left 10/2015   CHEST TUBE INSERTION Left 03/17/2021   Procedure: INSERTION PLEURAL DRAINAGE CATHETER;  Surgeon: Garner Nash, DO;  Location: [place];  Service: Pulmonary;  Laterality:  Left;  Indwelling Tunneled Pleural catheter (PLEUREX)    COLONOSCOPY W/ POLYPECTOMY  02/2014   CORONARY ANGIOPLASTY WITH STENT PLACEMENT  05/2003; 12/2005   "mid RCA; mid RCA"   FINE NEEDLE ASPIRATION  02/26/2021   Procedure: FINE NEEDLE ASPIRATION (FNA) LINEAR;  Surgeon: Candee Furbish, MD;  Location: [facility] ENDOSCOPY;  Service: Pulmonary;;   HAND SURGERY  02/2019   LEFT HAND   implantable loop recorder placement  02/27/2020   [employer] Reveal [place] model [id number]  implantable loop recorder implanted by Dr Rayann Heman for afib management and evaluation of presyncope   IR IMAGING GUIDED PORT INSERTION  04/16/2021   JOINT REPLACEMENT     KNEE ARTHROSCOPY Left 10/2005   KNEE ARTHROSCOPY W/ PARTIAL MEDIAL MENISCECTOMY Left 09/2005   LUMBAR LAMINECTOMY/DECOMPRESSION MICRODISCECTOMY  03/2005   "L4-5"   POSTERIOR LUMBAR FUSION  10/2003   L5-S1; "plates, screws"   SHOULDER ARTHROSCOPY Right 08/2011   Debridement of labrum, arthroscopic distal clavicle excision   SHOULDER OPEN ROTATOR CUFF REPAIR Left 07/2014   TEE WITHOUT CARDIOVERSION  07/07/2012   Procedure: TRANSESOPHAGEAL ECHOCARDIOGRAM (TEE);  Surgeon: Fay Records, MD;  Location[facility] ENDOSCOPY;  Service: Cardiovascular;  Laterality: N/A;   THORACENTESIS N/A 02/25/2021   Procedure: Mathews Robinsons;  Surgeon: Juanito Doom, MD;  Location: [place];  Service: Cardiopulmonary;  Laterality: N/A;   TONSILLECTOMY AND  ADENOIDECTOMY  ~ 1956   TOTAL KNEE ARTHROPLASTY Left 10/2006   TRIGGER FINGER RELEASE Left 10/2015   VIDEO BRONCHOSCOPY WITH ENDOBRONCHIAL ULTRASOUND Left 02/26/2021   Procedure: VIDEO BRONCHOSCOPY WITH ENDOBRONCHIAL ULTRASOUND;  Surgeon: Candee Furbish, MD;  Location[facility] ENDOSCOPY;  Service: Pulmonary;  Laterality: Left;  cryoprobe too thanks!   HPI:  ADITHYA DIFRANCESCO is a 76 y.o. male with PMH stage IV NSCLC with liver mets, PAF, CAD, Parkinson's disease, chronic low back pain, anemia, anxiety, osteoarthritis, HLD, HTN, GERD, OSA on  CPAP, prostate cancer.    Assessment / Plan / Recommendation  Clinical Impression  Pt known to SLP service from prior hospitalization for R PNA. His R sided PNA appears to be chronic in nature, likely r/t his NSCLC and 40 radiation treatments (per wife report). He recently had MBSS 3 months ago (with same clinical indication--R PNA), which revealed normal oropharyngeal swallow function without penetration or aspiration, but noted GERD up to hypopharynx. Suspect some level of esophageal dysmotility r/t surrounding radiation sites. He and wife deny any dysphagia, coughing with intake, etc. He also has PMH of Parkinson's disease and is therefore at risk for dysphagia. He passed the [employer] Protocol 3 oz challenge by consuming 3 oz consecutively without s/s aspiration, making him at low risk for aspiration PNA. Pt also appeared to have good oral hygiene.  He had no difficulty with solids or purees.  At this time, recommend thin liquids, regular consistency solids, meds as tolerated, no further ST. Given pt's high risk for development of dysphagia, recommend annual MBSS or as indicated. Pt/family in agreement with plan.  SLP Visit Diagnosis: Dysphagia, unspecified (R13.10)    Aspiration Risk  Mild aspiration risk    Diet Recommendation Regular;Thin liquid   Liquid Administration via: Straw;Cup Medication Administration: Whole meds with liquid Postural Changes: Seated upright at 90 degrees    Other  Recommendations Oral Care Recommendations: Oral care BID    Recommendations for follow up therapy are one component of a multi-disciplinary discharge planning process, led by the attending physician.  Recommendations may be updated based on patient status, additional functional criteria and insurance authorization.  Follow up Recommendations No SLP follow up      Functional Status Assessment Patient has not had a recent decline in their functional status       Swallow Study   General Date  of Onset: 11/19/21 HPI: Corey [name] is a 76 y.o. male with PMH stage IV NSCLC with liver mets, PAF, CAD, Parkinson's disease, chronic low back pain, anemia, anxiety, osteoarthritis, HLD, HTN, GERD, OSA on CPAP, prostate cancer. Type of Study: Bedside Swallow Evaluation Previous Swallow Assessment: MBSS 08/01/21: WNL Diet Prior to this Study: Regular;Thin liquids Temperature Spikes Noted: No Respiratory Status: Nasal cannula History of Recent Intubation: No Behavior/Cognition: Cooperative;Alert;Pleasant mood Oral Cavity Assessment: Within Functional Limits Oral Care Completed by SLP: No Oral Cavity - Dentition: Adequate natural dentition Vision: Functional for self-feeding Self-Feeding Abilities: Able to feed self Patient Positioning: Upright in bed Baseline Vocal Quality: Hoarse Volitional Cough: Strong Volitional Swallow: Able to elicit    Oral/Motor/Sensory Function Overall Oral Motor/Sensory Function: Other (comment) (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 Jariyah Hackley, M.S., [place] Pathologist Acute Rehabilitation Services Pager: [place] 11/22/2021,2:10 PM

## 2021-11-23 DIAGNOSIS — J9611 Chronic respiratory failure with hypoxia: Secondary | ICD-10-CM | POA: Diagnosis not present

## 2021-11-23 DIAGNOSIS — Z7189 Other specified counseling: Secondary | ICD-10-CM

## 2021-11-23 DIAGNOSIS — C3492 Malignant neoplasm of unspecified part of left bronchus or lung: Secondary | ICD-10-CM

## 2021-11-23 DIAGNOSIS — Z515 Encounter for palliative care: Secondary | ICD-10-CM

## 2021-11-23 DIAGNOSIS — Z9981 Dependence on supplemental oxygen: Secondary | ICD-10-CM | POA: Diagnosis not present

## 2021-11-23 LAB — CBC WITH DIFFERENTIAL/PLATELET
Abs Immature Granulocytes: 0 10*3/uL (ref 0.00–0.07)
Basophils Absolute: 0 10*3/uL (ref 0.0–0.1)
Basophils Relative: 0 %
Eosinophils Absolute: 0.1 10*3/uL (ref 0.0–0.5)
Eosinophils Relative: 1 %
HCT: 27.3 % — ABNORMAL LOW (ref 39.0–52.0)
Hemoglobin: 8.7 g/dL — ABNORMAL LOW (ref 13.0–17.0)
Lymphocytes Relative: 9 %
Lymphs Abs: 0.9 10*3/uL (ref 0.7–4.0)
MCH: 34.8 pg — ABNORMAL HIGH (ref 26.0–34.0)
MCHC: 31.9 g/dL (ref 30.0–36.0)
MCV: 109.2 fL — ABNORMAL HIGH (ref 80.0–100.0)
Monocytes Absolute: 0.6 10*3/uL (ref 0.1–1.0)
Monocytes Relative: 6 %
Neutro Abs: 8 10*3/uL — ABNORMAL HIGH (ref 1.7–7.7)
Neutrophils Relative %: 84 %
Platelets: 242 10*3/uL (ref 150–400)
RBC: 2.5 MIL/uL — ABNORMAL LOW (ref 4.22–5.81)
RDW: 18.3 % — ABNORMAL HIGH (ref 11.5–15.5)
WBC: 9.5 10*3/uL (ref 4.0–10.5)
nRBC: 0.2 % (ref 0.0–0.2)

## 2021-11-23 LAB — HEPATIC FUNCTION PANEL
ALT: 10 U/L (ref 0–44)
AST: 15 U/L (ref 15–41)
Albumin: 2.7 g/dL — ABNORMAL LOW (ref 3.5–5.0)
Alkaline Phosphatase: 58 U/L (ref 38–126)
Bilirubin, Direct: 0.1 mg/dL (ref 0.0–0.2)
Indirect Bilirubin: 0.1 mg/dL — ABNORMAL LOW (ref 0.3–0.9)
Total Bilirubin: 0.2 mg/dL — ABNORMAL LOW (ref 0.3–1.2)
Total Protein: 5.6 g/dL — ABNORMAL LOW (ref 6.5–8.1)

## 2021-11-23 LAB — BASIC METABOLIC PANEL
Anion gap: 6 (ref 5–15)
BUN: 26 mg/dL — ABNORMAL HIGH (ref 8–23)
CO2: 26 mmol/L (ref 22–32)
Calcium: 8.8 mg/dL — ABNORMAL LOW (ref 8.9–10.3)
Chloride: 102 mmol/L (ref 98–111)
Creatinine, Ser: 0.88 mg/dL (ref 0.61–1.24)
GFR, Estimated: >60 mL/min (ref >60)
Glucose, Bld: 102 mg/dL — ABNORMAL HIGH (ref 70–99)
Potassium: 3.9 mmol/L (ref 3.5–5.1)
Sodium: 134 mmol/L — ABNORMAL LOW (ref 135–145)

## 2021-11-23 LAB — MAGNESIUM: Magnesium: 1.8 mg/dL (ref 1.7–2.4)

## 2021-11-23 LAB — TSH: TSH: 1.286 u[IU]/mL (ref 0.350–4.500)

## 2021-11-23 MED ORDER — ENSURE ENLIVE PO LIQD
237.0000 mL | Freq: Two times a day (BID) | ORAL | Status: DC
  Administered 2021-11-23 – 2021-11-26 (×4): 237 mL via ORAL

## 2021-11-23 NOTE — NC FL2 (Signed)
[place] LEVEL OF CARE SCREENING TOOL     IDENTIFICATION  Patient Name: Corey [name] Birthdate: [birth year] Sex: male Admission Date (Current Location): 11/21/2021  [facility] and [place] Number:  Herbalist and Address:  [facility],  [address], [place]      Provider Number: [id number]  Attending Physician Name and Address:  Dwyane Dee, MD  Relative Name and Phone Number:  Savvas Roper Wife [phone]    Current Level of Care: Hospital Recommended Level of Care: [place] Prior Approval Number:    Date Approved/Denied:   PASRR Number: [id number]  Discharge Plan: SNF    Current Diagnoses: Patient Active Problem List   Diagnosis Date Noted   COPD (chronic obstructive pulmonary disease) ([place]) 11/21/2021   Chronic respiratory failure with hypoxia 11/21/2021   DNR (do not resuscitate) 11/21/2021   Atrial fibrillation with RVR ([place]) 11/21/2021   Pressure injury of skin 11/16/2021   Dysarthria 11/10/2021   Stasis dermatitis 11/06/2021   Prolonged Q-T interval on ECG 11/04/2021   Macrocytic anemia 11/03/2021   Streptococcal sore throat    Acute on chronic anemia    Supplemental oxygen dependent    Notalgia    Adjustment disorder with mixed anxiety and depressed mood    Physical deconditioning 08/12/2021   Malaise 07/28/2021   Port-A-Cath in place 04/27/2021   Genetic testing 18/84/1660   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   CAP (community acquired pneumonia) 04/01/2021   Family history of prostate cancer 03/20/2021   Family history of breast cancer 03/20/2021   Family history of lung cancer 03/20/2021   Goals of care, counseling/discussion 03/18/2021   Encounter for antineoplastic chemotherapy 03/18/2021   Encounter for antineoplastic immunotherapy 03/18/2021   Primary cancer of left lower lobe of lung ([place]) 03/07/2021   Non-small cell carcinoma of left lung, stage 4 ([place]) 03/05/2021    Malnutrition of moderate degree 02/25/2021   Hilar mass    Reactive airway disease 11/20/2020   Allergic rhinitis with non-allergic component 09/18/2020   Heartburn 09/18/2020   Malignant neoplasm of prostate ([place]) 03/04/2020   Pre-syncope 02/27/2020   Cerebral embolism with transient ischemic attack (TIA) 02/11/2020   History of stroke 63/10/6008   Embolic stroke involving cerebral artery ([place]) 02/11/2020   Secondary hypercoagulable state ([place]) 09/10/2019   Essential tremor 06/25/2019   Morbid obesity ([place]) 05/27/2017   Carotid artery disease ([place]) 08/30/2016   Essential hypertension 08/30/2016   Iron deficiency 01/05/2016   Lumbar radiculopathy, chronic 02/04/2015   SVT (supraventricular tachycardia) ([place]) 12/03/2014   PAC (premature atrial contraction) 04/05/2014   OSA on CPAP 03/27/2014   AF (paroxysmal atrial fibrillation) ([place]) 01/05/2013   CVA (cerebral infarction) 07/06/2012   Coronary artery disease    Benign essential HTN    Dyslipidemia    Shortness of breath    GERD (gastroesophageal reflux disease)     Orientation RESPIRATION BLADDER Height & Weight     Self, Time, Situation, Place  O2 (4L O2) Continent Weight: 104.9 kg Height:  6' 1"  (185.4 cm)  BEHAVIORAL SYMPTOMS/MOOD NEUROLOGICAL BOWEL NUTRITION STATUS      Continent Diet (Regular)  AMBULATORY STATUS COMMUNICATION OF NEEDS Skin   Extensive Assist Verbally Normal                       Personal Care Assistance Level of Assistance  Bathing, Feeding, Dressing Bathing Assistance: Limited assistance Feeding assistance: Limited assistance Dressing Assistance: Limited  assistance     Functional Limitations Info  Sight, Hearing, Speech Sight Info: Impaired Hearing Info: Adequate Speech Info: Adequate    SPECIAL CARE FACTORS FREQUENCY  OT (By licensed OT), PT (By licensed PT)     PT Frequency: x5 week OT Frequency: x5 week            Contractures Contractures Info: Not present     Additional Factors Info  Code Status, Allergies Code Status Info: FULL Allergies Info: Amoxicillin, Fosinopril, Hydromorphone, Ampicillin, Codeine, Rosuvastatin           Current Medications (11/23/2021):  This is the current hospital active medication list Current Facility-Administered Medications  Medication Dose Route Frequency Provider Last Rate Last Admin   acetaminophen (TYLENOL) tablet 650 mg  650 mg Oral Q6H PRN Marylyn Ishihara, Tyrone A, DO       albuterol (PROVENTIL) (2.5 MG/3ML) 0.083% nebulizer solution 3 mL  3 mL Inhalation Q4H PRN Marylyn Ishihara, Tyrone A, DO       ALPRAZolam Duanne Moron) tablet 0.5 mg  0.5 mg Oral QHS PRN Marylyn Ishihara, Tyrone A, DO   0.5 mg at 11/22/21 2113   amiodarone (PACERONE) tablet 200 mg  200 mg Oral QHS Kyle, Tyrone A, DO   200 mg at 11/22/21 2112   arformoterol (BROVANA) nebulizer solution 15 mcg  15 mcg Nebulization BID Marylyn Ishihara, Tyrone A, DO   15 mcg at 11/23/21 2426   And   umeclidinium bromide (INCRUSE ELLIPTA) 62.5 MCG/ACT 1 puff  1 puff Inhalation Daily Marylyn Ishihara, Tyrone A, DO       benzonatate (TESSALON) capsule 200 mg  200 mg Oral TID Marylyn Ishihara, Tyrone A, DO   200 mg at 11/23/21 1003   budesonide (PULMICORT) nebulizer solution 0.5 mg  0.5 mg Nebulization BID Marylyn Ishihara, Tyrone A, DO   0.5 mg at 11/23/21 0732   carbidopa-levodopa (SINEMET IR) 25-100 MG per tablet immediate release 1 tablet  1 tablet Oral QID Marylyn Ishihara, Tyrone A, DO   1 tablet at 11/23/21 1003   Carbinoxamine Maleate TABS 6 mg  6 mg Oral BID Marylyn Ishihara, Tyrone A, DO   6 mg at 11/23/21 1005   ceFEPIme (MAXIPIME) 2 g in sodium chloride 0.9 % 100 mL IVPB  2 g Intravenous Q8H Blount, Xenia T, NP 200 mL/hr at 11/23/21 1031 2 g at 11/23/21 1031   chlorhexidine (PERIDEX) 0.12 % solution 15 mL  15 mL Mouth Rinse BID Marylyn Ishihara, Tyrone A, DO   15 mL at 11/23/21 1003   Chlorhexidine Gluconate Cloth 2 % PADS 6 each  6 each Topical Daily Curt Bears, MD   6 each at 11/22/21 1025   diltiazem (CARDIZEM CD) 24 hr capsule 360 mg  360 mg Oral Daily  Chandrasekhar, Mahesh A, MD   360 mg at 11/23/21 1004   fenofibrate tablet 160 mg  160 mg Oral Daily Kyle, Tyrone A, DO   160 mg at 11/22/21 1420   fluticasone (FLONASE) 50 MCG/ACT nasal spray 2 spray  2 spray Each Nare Daily Marylyn Ishihara, Tyrone A, DO   2 spray at 83/41/96 2229   folic acid (FOLVITE) tablet 1 mg  1 mg Oral Daily Kyle, Tyrone A, DO   1 mg at 11/23/21 1007   guaiFENesin (MUCINEX) 12 hr tablet 1,200 mg  1,200 mg Oral BID Marylyn Ishihara, Tyrone A, DO   1,200 mg at 11/23/21 1006   ipratropium (ATROVENT) 0.03 % nasal spray 1-2 spray  1-2 spray Each Nare Q12H Kyle, Tyrone A, DO   2 spray at 11/23/21  1008   levalbuterol (XOPENEX) nebulizer solution 0.63 mg  0.63 mg Nebulization BID Marylyn Ishihara, Tyrone A, DO   0.63 mg at 11/23/21 0732   loratadine (CLARITIN) tablet 10 mg  10 mg Oral Daily Marylyn Ishihara, Tyrone A, DO   10 mg at 11/23/21 1005   magnesium oxide (MAG-OX) tablet 400 mg  400 mg Oral Daily Marylyn Ishihara, Tyrone A, DO   400 mg at 11/23/21 1007   MEDLINE mouth rinse  15 mL Mouth Rinse q12n4p Kyle, Tyrone A, DO   15 mL at 11/22/21 1645   metoprolol succinate (TOPROL-XL) 24 hr tablet 25 mg  25 mg Oral Daily Kyle, Tyrone A, DO   25 mg at 11/23/21 1006   multivitamin with minerals tablet 1 tablet  1 tablet Oral Daily Marylyn Ishihara, Tyrone A, DO   1 tablet at 11/23/21 1003   ondansetron (ZOFRAN) tablet 8 mg  8 mg Oral Q8H PRN Marylyn Ishihara, Tyrone A, DO       pantoprazole (PROTONIX) EC tablet 40 mg  40 mg Oral Daily Kyle, Tyrone A, DO   40 mg at 11/23/21 1004   polyethylene glycol (MIRALAX / GLYCOLAX) packet 17 g  17 g Oral BID Marylyn Ishihara, Tyrone A, DO   17 g at 11/23/21 1003   potassium chloride (KLOR-CON) CR tablet 10 mEq  10 mEq Oral Daily Marylyn Ishihara, Tyrone A, DO   10 mEq at 11/22/21 1420   predniSONE (DELTASONE) tablet 30 mg  30 mg Oral Q breakfast Marylyn Ishihara, Tyrone A, DO   30 mg at 11/23/21 1004   rivaroxaban (XARELTO) tablet 20 mg  20 mg Oral QPM Kyle, Tyrone A, DO   20 mg at 11/22/21 1811   saccharomyces boulardii (FLORASTOR) capsule 250 mg  250 mg Oral  Daily Marylyn Ishihara, Tyrone A, DO   250 mg at 11/23/21 1004   senna-docusate (Senokot-S) tablet 1 tablet  1 tablet Oral BID Marylyn Ishihara, Tyrone A, DO   1 tablet at 11/23/21 1006   sodium chloride flush (NS) 0.9 % injection 10-40 mL  10-40 mL Intracatheter PRN Curt Bears, MD       tamsulosin [facility]) capsule 0.4 mg  0.4 mg Oral QPC supper Marylyn Ishihara, Tyrone A, DO   0.4 mg at 11/22/21 1811   topiramate (TOPAMAX) tablet 100 mg  100 mg Oral BID Marylyn Ishihara, Tyrone A, DO   100 mg at 11/23/21 1007   traMADol (ULTRAM) tablet 50 mg  50 mg Oral Q6H PRN Cherylann Ratel A, DO         Discharge Medications: Please see discharge summary for a list of discharge medications.  Relevant Imaging Results:  Relevant Lab Results:   Additional Information [place]#[id number]  Purcell Mouton, RN

## 2021-11-23 NOTE — Progress Notes (Signed)
Initial Nutrition Assessment  DOCUMENTATION CODES:   Not applicable  INTERVENTION:  - will order Ensure Enlive po BID, each supplement provides 350 kcal and 20 grams of protein.   NUTRITION DIAGNOSIS:   Increased nutrient needs related to acute illness, cancer and cancer related treatments, chronic illness as evidenced by estimated needs.  GOAL:   Patient will meet greater than or equal to 90% of their needs  MONITOR:   PO intake, Supplement acceptance, Labs, Weight trends  REASON FOR ASSESSMENT:   Diagnosis    ASSESSMENT:   76 y.o. male with PMH stage IV NSCLC with liver mets, PAF, CAD, chronic low back pain, anemia, anxiety, osteoarthritis, HLD, HTN, GERD, OSA on CPAP, prostate cancer. He was recently discharged to rehab but after arriving there were issues with his O2 concentrator and he was sent back to the hospital. He remains on 4L O2. On presentation he was also diagnosed with afib with RVR so he was started on Cardizem drip and Cardiology was consulted. CXR was concerning for new R-sided PNA so he was started on abx.  Patient being followed by this RD during recent hospitalization from which he was discharged on 2/17. He returned on 2/18. Last RD follow-up assessment was on 2/13.  Patient was eating 50-75% at meals at that time and appetite was improving. He was drinking 1-2 bottles of Ensure/day at that time.   The only documented meal completion since return to the hospital was 90% of breakfast yesterday (776 kcal and 39 grams protein).  Weight on 2/18 was stable weight weight since 10/20/21. Mild pitting edema to all extremities documented in the edema section of flow sheet.     Labs reviewed; Na: 134 mmol/l, BUN: 26 mg/dl.  Medications reviewed; 1 mg folvite/day, 400 mg mag-ox/day, 1 tablet multivitamin with minerals/day, 17 g miralax BID, 40 mg oral protonix/day, 30 mg deltasone/day, 250 mg florastor/day, 1 tablet senokot BID.   Diet Order:   Diet Order              Diet regular Room service appropriate? Yes; Fluid consistency: Thin  Diet effective now                   EDUCATION NEEDS:   No education needs have been identified at this time  Skin:  Skin Assessment: Skin Integrity Issues: Skin Integrity Issues:: Stage I Stage I: sacrum  Last BM:  2/20 (type 6, large amount)  Height:   Ht Readings from Last 1 Encounters:  11/21/21 6\' 1"  (1.854 m)    Weight:   Wt Readings from Last 1 Encounters:  11/21/21 104.9 kg     BMI:  Body mass index is 30.51 kg/m.  Estimated Nutritional Needs:  Kcal:  2100-2300 kcal Protein:  105-120 grams Fluid:  >/= 2.1 L/day     Jarome Matin, MS, RD, LDN Inpatient Clinical Dietitian RD pager # available in [place]  After hours/weekend pager # available in [facility]

## 2021-11-23 NOTE — Consult Note (Signed)
Consultation Note Date: 11/23/2021   Patient Name: Corey [name]  DOB: 1945-12-23  MRN: [id number]  Age / Sex: 76 y.o., male  PCP: [name], MD Referring Physician: Dwyane Dee, MD  Reason for Consultation: Establishing goals of care  HPI/Patient Profile: 76 y.o. male  with past medical history of stage IV NSCLC with liver mets, PAF, CAD, anemia, anxiety, OA, HLD, HTN, GERD, OSA, Prostate cancer, recent admission with CAP sepsis/pneumonitis/AF RVR/hyponatremia/volume overload/dysarthria admitted on 11/21/2021 with no available oxygen concentrator for him to use at SNF and when in ED he went into AF RVR and required diltiazem infusion. Atrial fibrillation now better controlled and he is awaiting placement.   Clinical Assessment and Goals of Care: I met today with Corey [name] along with his brother at bedside. Chart reviewed and noted that Corey [name] just recently had conversations with palliative provider Gene Domingo Cocking. I followed up with Corey [name] on these conversations. Corey [name] confirms his desire to continue to pursue rehab therapies with hopes of gaining some strength. He is very motivated and is performing bed exercises on his own but anxious to get somewhere to do some real work. He is tired of lying in a hospital bed. He is unsure if the plan is to pursue CIR or SNF rehab and he seems open to either option as long as he can rehab. He continues to his plan to reassess his progress after rehab and to follow up with Dr. [name] for options. He is unsure if he would desire more chemotherapy. He is interested in knowing his options and the risks vs benefits at that time. He understands his hospice options if he does not pursue more treatment.   I also took the opportunity to clarify code status with Corey [name]. He had elected DNR status with his conversation with Dr. Domingo Cocking but is currently listed full code. [name] clarifies that he desires full code at current time but with any further decline they would elect DNR status. He assures me that he has discussed with his wife and she is aware of his wishes and would be able to elect DNR on his behalf if he was unable to do so himself.   All questions/concerns addressed. Emotional support provided.   Primary Decision Maker PATIENT    SUMMARY OF RECOMMENDATIONS   - Full code at this time (low threshold for electing DNR) - Awaiting placement for rehab trial  Code Status/Advance Care Planning: Full code   Symptom Management:  Per attending.  Flutter valve ordered. Pulmonary hygiene education.   Palliative Prophylaxis:  Aspiration, Bowel Regimen, and Frequent Pain Assessment  Prognosis:  Overall prognosis poor with underlying stage IV lung cancer.   Discharge Planning: [place] for rehab with Palliative care service follow-up      Primary Diagnoses: Present on Admission:  Coronary artery disease  Benign essential HTN  Dyslipidemia  Morbid obesity ([place])  Non-small cell carcinoma of left lung, stage 4 (HCC)  Physical deconditioning  Atrial fibrillation with RVR ([place])  I have reviewed the medical record, interviewed the patient and family, and examined the patient. The following aspects are pertinent.  Past Medical History:  Diagnosis Date   Anemia    Anxiety    Arthritis    "back, right knee, hands, ankles, neck" (05/31/2016)   Atrial fibrillation with RVR ([place]) 11/03/2021   Carotid artery disease (HCC)    Chronic lower back pain    COPD with acute exacerbation ([place]) 04/02/2021   Coronary atherosclerosis of native coronary artery    a. BMS to [facility] 2004 and 2007, otherwise mild nonobstructive disease. EF normal.   Diverticulitis    Dyslipidemia    Essential hypertension, benign    Family history of breast cancer    Family history of lung cancer    Family history of prostate cancer    GERD (gastroesophageal  reflux disease)    Hyponatremia 06/04/2016   Lumbar radiculopathy, chronic 02/04/2015   Right L5   Lung cancer ([place])    Mild dilation of ascending aorta ([place]) 08/2021   Obesity    OSA on CPAP    uses cpap   Parkinson's disease ([place])    Paroxysmal atrial fibrillation ([place])    a. Discovered after stroke.   Pleural effusion on left 02/24/2021   Pneumonia 01/1996   PONV (postoperative nausea and vomiting)    Prostate CA (HCC)    Recurrent upper respiratory infection (URI)    Stroke ([place]) 07/2012   no deficits   Symptomatic anemia    Visit for monitoring Tikosyn therapy 09/18/2019   Social History   Socioeconomic History   Marital status: Married    Spouse name: Renee   Number of children: 3   Years of education: 14   Highest education level: Not on file  Occupational History   Occupation: Retired    Fish farm manager: DISABLED     Comment: Works at [employer] part time  Tobacco Use   Smoking status: Former    Packs/day: 1.00    Years: 34.00    Pack years: 34.00    Types: Cigarettes    Quit date: 05/05/2003    Years since quitting: 18.5    Passive exposure: Never   Smokeless tobacco: Never  Vaping Use   Vaping Use: Never used  Substance and Sexual Activity   Alcohol use: No    Alcohol/week: 0.0 standard drinks   Drug use: No   Sexual activity: Not Currently  Other Topics Concern   Not on file  Social History Narrative   Patient lives at home with spouse.   Caffeine Use:    Social Determinants of Radio broadcast assistant Strain: Not on file  Food Insecurity: Not on file  Transportation Needs: Not on file  Physical Activity: Not on file  Stress: Not on file  Social Connections: Not on file   Family History  Problem Relation Age of Onset   Hypertension Mother    Heart disease Father    Heart attack Father    Prostate cancer Brother 78   Parkinson's disease Brother    Lung cancer Paternal Aunt        hx of smoking   Breast cancer Paternal Grandmother        dx  64s, bilateral mastectomies   Heart attack Paternal Grandfather    Blindness Son    Colon cancer Neg Hx    Pancreatic cancer Neg Hx    Scheduled Meds:  amiodarone  200 mg Oral QHS   arformoterol  15  mcg Nebulization BID   And   umeclidinium bromide  1 puff Inhalation Daily   benzonatate  200 mg Oral TID   budesonide  0.5 mg Nebulization BID   carbidopa-levodopa  1 tablet Oral QID   Carbinoxamine Maleate  6 mg Oral BID   chlorhexidine  15 mL Mouth Rinse BID   Chlorhexidine Gluconate Cloth  6 each Topical Daily   diltiazem  360 mg Oral Daily   fenofibrate  160 mg Oral Daily   fluticasone  2 spray Each Nare Daily   folic acid  1 mg Oral Daily   guaiFENesin  1,200 mg Oral BID   ipratropium  1-2 spray Each Nare Q12H   levalbuterol  0.63 mg Nebulization BID   loratadine  10 mg Oral Daily   magnesium oxide  400 mg Oral Daily   mouth rinse  15 mL Mouth Rinse q12n4p   metoprolol succinate  25 mg Oral Daily   multivitamin with minerals  1 tablet Oral Daily   pantoprazole  40 mg Oral Daily   polyethylene glycol  17 g Oral BID   potassium chloride  10 mEq Oral Daily   predniSONE  30 mg Oral Q breakfast   rivaroxaban  20 mg Oral QPM   saccharomyces boulardii  250 mg Oral Daily   senna-docusate  1 tablet Oral BID   tamsulosin  0.4 mg Oral QPC supper   topiramate  100 mg Oral BID   Continuous Infusions:  ceFEPime (MAXIPIME) IV 2 g (11/23/21 1031)   PRN Meds:.acetaminophen, albuterol, ALPRAZolam, ondansetron, sodium chloride flush, traMADol Allergies  Allergen Reactions   Amoxicillin Anaphylaxis and Swelling    Tolerates Rocephin   Fosinopril Other (See Comments) and Cough    Muscle aches (Monopril)   Hydromorphone Nausea And Vomiting   Ampicillin Rash   Codeine Nausea Only and Other (See Comments)    Heavy amounts cause nausea and stomach upset   Rosuvastatin Other (See Comments)    Joint pain   Review of Systems  Constitutional:  Positive for activity change.   Respiratory:  Negative for shortness of breath.   Cardiovascular:  Negative for chest pain.  Gastrointestinal:  Negative for constipation.  Neurological:  Positive for weakness.   Physical Exam Vitals and nursing note reviewed.  Constitutional:      General: He is not in acute distress.    Appearance: Normal appearance. He is well-developed and well-groomed.  Cardiovascular:     Rate and Rhythm: Tachycardia present.  Pulmonary:     Effort: No tachypnea, accessory muscle usage or respiratory distress.  Abdominal:     Palpations: Abdomen is soft.  Neurological:     Mental Status: He is alert and oriented to person, place, and time.    Vital Signs: BP 114/67 (BP Location: Left Arm)    Pulse (!) 129    Temp 97.7 F (36.5 C) (Oral)    Resp 19    Ht 6' 1"  (1.854 m)    Wt 104.9 kg    SpO2 97%    BMI 30.51 kg/m  Pain Scale: 0-10   Pain Score: 0-No pain   SpO2: SpO2: 97 % O2 Device:SpO2: 97 % O2 Flow Rate: .O2 Flow Rate (L/min): 4 L/min  IO: Intake/output summary:  Intake/Output Summary (Last 24 hours) at 11/23/2021 1511 Last data filed at 11/23/2021 1218 Gross per 24 hour  Intake --  Output 2550 ml  Net -2550 ml    LBM: Last BM Date : 11/23/21 Baseline Weight:  Weight: 104.9 kg Most recent weight: Weight: 104.9 kg     Palliative Assessment/Data:     Time Total: 45 min  Greater than 50%  of this time was spent counseling and coordinating care related to the above assessment and plan.  Signed by: Vinie Sill, NP Palliative Medicine Team Pager # [phone] (M-F 8a-5p) Team Phone # [phone] (Nights/Weekends)

## 2021-11-23 NOTE — Progress Notes (Addendum)
Progress Note  Patient Name: Corey [name] Date of Encounter: 11/23/2021  [facility] Cardiologist: Mertie Moores, MD   Subjective   Patient denies any chest pain, SOB, palpitations. Feels the urge to cough when he goes into afib. Wife at bedside has several questions about his discharge plan, confirmed there was a TOC consult placed.   Inpatient Medications    Scheduled Meds:  amiodarone  200 mg Oral QHS   arformoterol  15 mcg Nebulization BID   And   umeclidinium bromide  1 puff Inhalation Daily   benzonatate  200 mg Oral TID   budesonide  0.5 mg Nebulization BID   carbidopa-levodopa  1 tablet Oral QID   Carbinoxamine Maleate  6 mg Oral BID   chlorhexidine  15 mL Mouth Rinse BID   Chlorhexidine Gluconate Cloth  6 each Topical Daily   diltiazem  360 mg Oral Daily   fenofibrate  160 mg Oral Daily   fluticasone  2 spray Each Nare Daily   folic acid  1 mg Oral Daily   guaiFENesin  1,200 mg Oral BID   ipratropium  1-2 spray Each Nare Q12H   levalbuterol  0.63 mg Nebulization BID   loratadine  10 mg Oral Daily   magnesium oxide  400 mg Oral Daily   mouth rinse  15 mL Mouth Rinse q12n4p   metoprolol succinate  25 mg Oral Daily   multivitamin with minerals  1 tablet Oral Daily   pantoprazole  40 mg Oral Daily   polyethylene glycol  17 g Oral BID   potassium chloride  10 mEq Oral Daily   predniSONE  30 mg Oral Q breakfast   rivaroxaban  20 mg Oral QPM   saccharomyces boulardii  250 mg Oral Daily   senna-docusate  1 tablet Oral BID   tamsulosin  0.4 mg Oral QPC supper   topiramate  100 mg Oral BID   Continuous Infusions:  ceFEPime (MAXIPIME) IV 2 g (11/22/21 2333)   diltiazem (CARDIZEM) infusion Stopped (11/22/21 1225)   PRN Meds: acetaminophen, albuterol, ALPRAZolam, ondansetron, sodium chloride flush, traMADol   Vital Signs    Vitals:   11/22/21 1940 11/22/21 2052 11/23/21 0543 11/23/21 0733  BP:  125/72 113/69   Pulse: 85 88 85   Resp: 18 20 20    Temp:  98.1  F (36.7 C) 98 F (36.7 C)   TempSrc:  Oral Oral   SpO2: 100% 100% 96% 96%  Weight:      Height:        Intake/Output Summary (Last 24 hours) at 11/23/2021 0918 Last data filed at 11/23/2021 8119 Gross per 24 hour  Intake 430.87 ml  Output 2950 ml  Net -2519.13 ml   Last 3 Weights 11/21/2021 11/15/2021 10/28/2021  Weight (lbs) 231 lb 4.2 oz 231 lb 4.2 oz 228 lb 8 oz  Weight (kg) 104.9 kg 104.9 kg 103.647 kg      Telemetry    Predominantly atrial fibrillation, occasional sinus rhythm. HR in 70s-90s - Personally Reviewed  ECG    No new tracings since 2/18 - Personally Reviewed  Physical Exam   GEN: No acute distress. Wearing nasal cannula with 4 L oxygen Neck: No JVD Cardiac: Irregular rate and rhythm, no murmurs, rubs, or gallops.  Respiratory: Coarse breath sounds bilaterally, no wheezing or rhonchi  GI: Soft, nontender, non-distended  MS: No edema; No deformity.  Neuro:  Nonfocal. Oriented to person, place, and time  Psych: Normal affect   Labs  High Sensitivity Troponin:   Recent Labs  Lab 11/21/21 0937 11/21/21 1338  TROPONINIHS 10 8     Chemistry Recent Labs  Lab 11/18/21 0329 11/21/21 0937 11/22/21 0200 11/23/21 0349  NA 135 133* 135 134*  K 4.0 3.9 4.1 3.9  CL 100 100 102 102  CO2 27 26 25 26   GLUCOSE 84 103* 124* 102*  BUN 25* 24* 25* 26*  CREATININE 0.71 0.78 0.87 0.88  CALCIUM 8.7* 9.0 8.7* 8.8*  MG  --  1.8  --  1.8  PROT 5.9* 6.6 6.3*  --   ALBUMIN 2.8* 3.1* 3.0*  --   AST 21 18 16   --   ALT 12 7 11   --   ALKPHOS 63 56 55  --   BILITOT 0.2* 0.3 0.1*  --   GFRNONAA >60 >60 >60 >60  ANIONGAP 8 7 8 6     Lipids No results for input(s): CHOL, TRIG, HDL, LABVLDL, LDLCALC, CHOLHDL in the last 168 hours.  Hematology Recent Labs  Lab 11/21/21 0937 11/22/21 0200 11/23/21 0349  WBC 11.6* 10.3 9.5  RBC 2.76* 2.60* 2.50*  HGB 9.4* 9.0* 8.7*  HCT 30.1* 28.5* 27.3*  MCV 109.1* 109.6* 109.2*  MCH 34.1* 34.6* 34.8*  MCHC 31.2 31.6 31.9   RDW 18.0* 17.9* 18.3*  PLT 266 269 242   Thyroid  Recent Labs  Lab 11/21/21 0937  TSH 1.572    BNP Recent Labs  Lab 11/21/21 0949  BNP 161.0*    DDimer No results for input(s): DDIMER in the last 168 hours.   Radiology    CT Angio Chest PE W and/or Wo Contrast  Result Date: 11/21/2021 CLINICAL DATA:  Hypoxia.  Shortness of breath.  PE suspected. EXAM: CT ANGIOGRAPHY CHEST WITH CONTRAST TECHNIQUE: Multidetector CT imaging of the chest was performed using the standard protocol during bolus administration of intravenous contrast. Multiplanar CT image reconstructions and MIPs were obtained to evaluate the vascular anatomy. RADIATION DOSE REDUCTION: This exam was performed according to the departmental dose-optimization program which includes automated exposure control, adjustment of the mA and/or kV according to patient size and/or use of iterative reconstruction technique. CONTRAST:  35mL OMNIPAQUE IOHEXOL 350 MG/ML SOLN COMPARISON:  10/15/2021 FINDINGS: Cardiovascular: Heart is enlarged. Coronary artery calcification is evident. Mild atherosclerotic calcification is noted in the wall of the thoracic aorta. Enlargement of the pulmonary outflow tract/main pulmonary arteries suggests pulmonary arterial hypertension. There is no filling defect within the opacified pulmonary arteries to suggest the presence of an acute pulmonary embolus. Mediastinum/Nodes: No mediastinal lymphadenopathy. There is no hilar lymphadenopathy. The esophagus has normal imaging features. There is no axillary lymphadenopathy. Lungs/Pleura: Marked volume loss with extensive pleuroparenchymal scarring in the left hemithorax is similar to prior, presumably treatment related although there is progressive consolidative opacity and volume loss at the left base. Interval increase in interstitial and ill-defined ground-glass opacity in the posterior right lower lobe with patchy areas of subtle ground-glass opacity in the right upper  lobe. Small right pleural effusion is new in the interval. Upper Abdomen: Multiple ill-defined liver lesions again noted. Caudate lobe lesion appears progressive in the interval measuring 5.7 x 5.6 cm today compared to 4.1 x 3.2 cm previously. 1.4 cm lesion in the inferior right liver measured previously is 2.3 cm today on 283/6. 2.2 cm lesion measured in the dome of the liver on the prior study is 3.5 cm today on image 219/6. Musculoskeletal: No worrisome lytic or sclerotic osseous abnormality. Review of the MIP  images confirms the above findings. IMPRESSION: 1. No CT evidence for acute pulmonary embolus. 2. Enlargement of the pulmonary outflow tract/main pulmonary arteries suggests pulmonary arterial hypertension. 3. Marked volume loss with extensive pleuroparenchymal scarring in the left hemithorax, presumably treatment related although there is progressive consolidative opacity and volume loss at the left base. Features may be related to infection/inflammation although disease progression could certainly have this appearance. 4. Interval increase in interstitial and ill-defined ground-glass opacity in the posterior right lower lobe with patchy areas of subtle ground-glass opacity in the right upper lobe. Findings likely reflect multifocal right-sided pneumonia. 5. Small right pleural effusion, new in the interval. 6. Interval progression of multiple ill-defined liver lesions consistent with metastatic disease. 7. Aortic Atherosclerosis (ICD10-I70.0). Electronically Signed   By: Misty Stanley M.D.   On: 11/21/2021 10:31    Cardiac Studies     Patient Profile     76 y.o. male with a hx of CAD s/p remote PCIs of the RCA 2004 and 2007 with otherwise mild nonbstructive CAD, CVA, paroxysmal atrial fibrillation on Tikosyn, ILR in place, stage IV lung CA, HTN, HLD, prostate CA, OSA on CPAP, mild carotid artery disease (7-03% RICA, 50-09% LICA by duplex 38/1829), prior anemia requiring transfusions, mild dilation  of ascending aorta with recent discharge who presents for worsening hypoxia post discharge.  Assessment & Plan    Atrial fibrillation/ Aflutter with RVR  - Previously on Tikosyn, however stopped due to interaction with his cancer therapy, prolonged QT   - Was started on diltiazem IV, transitioned to oral diltiazem. IV dilt stopped this AM  - Currently on Diltiazem ER 360 mg daily, metoprolol succinate 25 mg daily, and Amiodarone 200 mg daily.  - Per telemetry, it appears patient converts between sinus rhythm and atrial fibrillation. HR in the 80s-90s. Unlikely patient will hold sinus rhythm long term given underlying lung disease   - Continue xarelto 20 mg daily for anticoagulation   HFpEF  RV dysfunction  - Most recent echocardiogram was 08/04/21 and showed EF 65-70%, mild LF hypertrophy, mild-moderately reduced RV function  - Patient euvolemic on exam  - Discontinued statin due to poor long term prognosis   Possibly CAP vs pneumonitis  - Managed per primary team   Lung Cancer Stage IV  - On palliative chemoradiation  - May benefit from hospice      For questions or updates, please contact [facility] Please consult [email or web address] for contact info under      Signed, Margie Billet, PA-C  11/23/2021, 9:18 AM     Personally seen and examined. Agree with APP above with the following comments: 76 yo M with PAF in the setting of lung cancer Patient notes that he no longed has the persistent cough he gets with RVR.  Wants to go to acute rehab and if this fails consider hospice. Exam notable for IRIR tachycardia. Rhonci bilaterally and expiratory wheeze. Personally reviewed relevant tests; rates and controlled < 100, PAF Would recommend  CCB, BB, and amio as above; eliquis for now If GOC changes can continue AV nodal agents- symptomatic with RVR   [facility] will sign off.   Medication Recommendations:  Diltiazem 360 mg PO XL, metoprolol succinate 25 mg PO daily,  amiodarone 200 mg PO daily Other recommendations (labs, testing, etc):  LFTs and TSH ; given his prognosis I am unsure if therapeutic drug monitoring for amiodarone would push [place] to stop this medication unless severe thyroid issues or transaminitis Follow up as  an outpatient:  12/02/21 AF clinic

## 2021-11-23 NOTE — TOC Progression Note (Signed)
Transition of Care [facility]) - Progression Note    Patient Details  Name: Corey [name] MRN: [id number] Date of Birth: 05-29-1946  Transition of Care [facility]) CM/SW Contact  Purcell Mouton, RN Phone Number: 11/23/2021, 1:09 PM  Clinical Narrative:     Pt plan to discharge to SNF if not CIR.   Expected Discharge Plan: [place] Barriers to Discharge: No Barriers Identified  Expected Discharge Plan and Services Expected Discharge Plan: [place]   Discharge Planning Services: CM Consult Post Acute Care Choice: [place] Living arrangements for the past 2 months: [place] Determinants of Health (SDOH) Interventions    Readmission Risk Interventions Readmission Risk Prevention Plan 08/05/2021  Transportation Screening Complete  PCP or Specialist appointment within 3-5 days of discharge Complete  HRI or [place] Complete  SW Recovery Care/Counseling Consult Complete  [place] Not Applicable  Some recent data might be hidden

## 2021-11-23 NOTE — Progress Notes (Signed)
Inpatient Rehab Admissions Coordinator:   I have reviewed this patient's case for potential CIR admit. Unfortunately, Pt. Was evaluated during previous admission and [employer] denied the case. Pt. Is  now re-admitted without significant change in medical status, so I do not believe [facility] Medicare will approve this admission. I am not  pursuing CIR admit for this Pt., recommend TOC look for other rehab venues for this Pt.  Clemens Catholic, [place], [place] Admissions Coordinator  [phone] ([place]) [phone] (office)

## 2021-11-23 NOTE — Progress Notes (Signed)
Pt provided flutter valve for use.  Pt demonstrated good technique and understanding of device.

## 2021-11-23 NOTE — Progress Notes (Signed)
Progress Note   Patient: Corey [name] VEL:[id number] DOB: 05/16/1946 DOA: 11/21/2021     2 DOS: the patient was seen and examined on 11/23/2021   Brief hospital course: Corey [name] is a 76 y.o. male with PMH stage IV NSCLC with liver mets, PAF, CAD, chronic low back pain, anemia, anxiety, osteoarthritis, HLD, HTN, GERD, OSA on CPAP, prostate cancer.  He was recently discharged to rehab but after arriving there were issues with his O2 concentrator and he was sent back to the hospital. He remains on 4L chronic oxygen.  On presentation he had also redeveloped A-fib with RVR.  Lung imaging also revealed concern for new right-sided pneumonia and he was started on antibiotics.  Cardiology was also consulted to assist with his RVR as he was started on a Cardizem drip on admission.  Assessment and Plan: * Chronic respiratory failure with hypoxia - On 4 L chronically -Will need to make sure facility able to accommodate his concentrator prior to discharging back  Atrial fibrillation with RVR ([place])- (present on admission) - No longer on Tikosyn as was stopped last hospitalization   - Cardiology following, appreciate assistance - s/p cardizem drip on admission; was able to be titrated off - now on diltiazem 360 mg daily, amiodarone 200 mg daily, Toprol 25 mg daily - Will check TSH and LFTs per cardiology recommendation; amiodarone to continue likely indefinitely unless severe thyroid issues or transaminitis -Outpatient follow-up with A-fib clinic on 12/02/2021  CAP (community acquired pneumonia) - New right-sided infiltrates noted on CTA - s/p vancomycin and cefepime on admission; previously MRSA negative; okay to continue on monotherapy cefepime for now (appropriate given recent abx and hospitalization); could target ~7 day course and de-escalate to [facility] if discharged before course complete   AF (paroxysmal atrial fibrillation) ([place]) Is on Xarelto, amiodarone, Cardizem, Toprol  Non-small cell  carcinoma of left lung, stage 4 ([place])- (present on admission) Stage IV lung cancer.  Patient followed by oncology, Dr. [name]  DNR (do not resuscitate) Verified DNR status.  COPD (chronic obstructive pulmonary disease) (HCC) Continue prednisone taper.  Supplemental oxygen dependent Continue supplemental oxygen at 4 L a minute.  Physical deconditioning- (present on admission) - Plan is to return to rehab at discharge; wife requesting reevaluation from CIR.  Might also want different rehab facility but she is still considering options  Secondary hypercoagulable state ([place]) Continue Xarelto  Morbid obesity ([place])- (present on admission) Chronic  Essential hypertension Continue home blood pressure medication  Dyslipidemia- (present on admission) - Statin discontinued due to lack of long-term benefit at this time  Benign essential HTN- (present on admission) - Continue Cardizem and Toprol -Watch BP as was on the soft side last hospitalization  Coronary artery disease- (present on admission) - Statin discontinued per cardiology due to lack of mortality benefit at this time - Continue Toprol - Continue Xarelto    Subjective: No events overnight.  Weaned off of Cardizem drip since yesterday.  He actually appears pretty well this morning and upper body strength even a little better than yesterday.  Still some minor wheezing on the right side but overall his breathing seems comfortable.  Wife present bedside this morning and answered her questions and updated her.  She is requesting for CIR to reevaluate for possible admission and also is still planning on talking to the rehab facility he went to prior to him returning back.  Physical Exam: Vitals:   11/22/21 2052 11/23/21 0543 11/23/21 0733 11/23/21 1140  BP: 125/72  113/69  114/67  Pulse: 88 85  (!) 129  Resp: 20 20  19   Temp: 98.1 F (36.7 C) 98 F (36.7 C)  97.7 F (36.5 C)  TempSrc: Oral Oral  Oral  SpO2: 100% 96% 96% 97%   Weight:      Height:       Physical Exam Constitutional:      General: He is not in acute distress.    Appearance: He is well-developed.  HENT:     Head: Normocephalic and atraumatic.     Mouth/Throat:     Mouth: Mucous membranes are moist.  Eyes:     Extraocular Movements: Extraocular movements intact.  Cardiovascular:     Rate and Rhythm: Normal rate. Rhythm irregular.  Pulmonary:     Comments: Mild coarse breath sounds bilaterally; relatively stable and just mild right upper lung fields expiratory wheezing appreciated Abdominal:     General: Bowel sounds are normal. There is no distension.     Palpations: Abdomen is soft.     Tenderness: There is no abdominal tenderness.  Musculoskeletal:        General: Normal range of motion.     Cervical back: Normal range of motion and neck supple.  Skin:    General: Skin is dry.  Neurological:     General: No focal deficit present.     Mental Status: He is alert.  Psychiatric:        Mood and Affect: Mood normal.        Behavior: Behavior normal.    Data Reviewed:  Results for orders placed or performed during the hospital encounter of 11/21/21 (from the past 24 hour(s))  Basic metabolic panel     Status: Abnormal   Collection Time: 11/23/21  3:49 AM  Result Value Ref Range   Sodium 134 (L) 135 - 145 mmol/L   Potassium 3.9 3.5 - 5.1 mmol/L   Chloride 102 98 - 111 mmol/L   CO2 26 22 - 32 mmol/L   Glucose, Bld 102 (H) 70 - 99 mg/dL   BUN 26 (H) 8 - 23 mg/dL   Creatinine, Ser 0.88 0.61 - 1.24 mg/dL   Calcium 8.8 (L) 8.9 - 10.3 mg/dL   GFR, Estimated >60 >60 mL/min   Anion gap 6 5 - 15  CBC with Differential/Platelet     Status: Abnormal   Collection Time: 11/23/21  3:49 AM  Result Value Ref Range   WBC 9.5 4.0 - 10.5 K/uL   RBC 2.50 (L) 4.22 - 5.81 MIL/uL   Hemoglobin 8.7 (L) 13.0 - 17.0 g/dL   HCT 27.3 (L) 39.0 - 52.0 %   MCV 109.2 (H) 80.0 - 100.0 fL   MCH 34.8 (H) 26.0 - 34.0 pg   MCHC 31.9 30.0 - 36.0 g/dL   RDW  18.3 (H) 11.5 - 15.5 %   Platelets 242 150 - 400 K/uL   nRBC 0.2 0.0 - 0.2 %   Neutrophils Relative % 84 %   Neutro Abs 8.0 (H) 1.7 - 7.7 K/uL   Lymphocytes Relative 9 %   Lymphs Abs 0.9 0.7 - 4.0 K/uL   Monocytes Relative 6 %   Monocytes Absolute 0.6 0.1 - 1.0 K/uL   Eosinophils Relative 1 %   Eosinophils Absolute 0.1 0.0 - 0.5 K/uL   Basophils Relative 0 %   Basophils Absolute 0.0 0.0 - 0.1 K/uL   WBC Morphology MORPHOLOGY UNREMARKABLE    RBC Morphology MORPHOLOGY UNREMARKABLE    Smear Review  MORPHOLOGY UNREMARKABLE    nRBC HIDE 0 /100 WBC   Abs Immature Granulocytes 0.00 0.00 - 0.07 K/uL  Magnesium     Status: None   Collection Time: 11/23/21  3:49 AM  Result Value Ref Range   Magnesium 1.8 1.7 - 2.4 mg/dL    I have Reviewed nursing notes, Vitals, and Lab results since pt's last encounter. Pertinent lab results : see above I have ordered test including BMP, CBC, Mg I have reviewed the last note from staff over past 24 hours I have discussed pt's care plan and test results with nursing staff, case manager  Antimicrobials: Vancomycin 11/21/2021 >> 2/19 Cefepime 11/21/2021 >> current  Consultants: Cardiology  Procedures:    DVT ppx:   rivaroxaban (XARELTO) tablet 20 mg     Code Status: Full Code   Family Communication:   Disposition: Status is: Inpatient Remains inpatient appropriate because: Treatment as outlined in A&P    Planned Discharge Destination: Skilled nursing facility   Author: Dwyane Dee, MD 11/23/2021 2:11 PM  For on call review [email or web address].

## 2021-11-24 LAB — CBC WITH DIFFERENTIAL/PLATELET
Abs Immature Granulocytes: 0.66 10*3/uL — ABNORMAL HIGH (ref 0.00–0.07)
Basophils Absolute: 0.1 10*3/uL (ref 0.0–0.1)
Basophils Relative: 1 %
Eosinophils Absolute: 0.1 10*3/uL (ref 0.0–0.5)
Eosinophils Relative: 1 %
HCT: 29.6 % — ABNORMAL LOW (ref 39.0–52.0)
Hemoglobin: 9.2 g/dL — ABNORMAL LOW (ref 13.0–17.0)
Immature Granulocytes: 7 %
Lymphocytes Relative: 4 %
Lymphs Abs: 0.4 10*3/uL — ABNORMAL LOW (ref 0.7–4.0)
MCH: 34.3 pg — ABNORMAL HIGH (ref 26.0–34.0)
MCHC: 31.1 g/dL (ref 30.0–36.0)
MCV: 110.4 fL — ABNORMAL HIGH (ref 80.0–100.0)
Monocytes Absolute: 1.1 10*3/uL — ABNORMAL HIGH (ref 0.1–1.0)
Monocytes Relative: 12 %
Neutro Abs: 7.5 10*3/uL (ref 1.7–7.7)
Neutrophils Relative %: 75 %
Platelets: 274 10*3/uL (ref 150–400)
RBC: 2.68 MIL/uL — ABNORMAL LOW (ref 4.22–5.81)
RDW: 18.1 % — ABNORMAL HIGH (ref 11.5–15.5)
WBC: 9.9 10*3/uL (ref 4.0–10.5)
nRBC: 0.2 % (ref 0.0–0.2)

## 2021-11-24 LAB — BASIC METABOLIC PANEL
Anion gap: 6 (ref 5–15)
BUN: 28 mg/dL — ABNORMAL HIGH (ref 8–23)
CO2: 26 mmol/L (ref 22–32)
Calcium: 8.8 mg/dL — ABNORMAL LOW (ref 8.9–10.3)
Chloride: 104 mmol/L (ref 98–111)
Creatinine, Ser: 0.81 mg/dL (ref 0.61–1.24)
GFR, Estimated: >60 mL/min (ref >60)
Glucose, Bld: 118 mg/dL — ABNORMAL HIGH (ref 70–99)
Potassium: 4.3 mmol/L (ref 3.5–5.1)
Sodium: 136 mmol/L (ref 135–145)

## 2021-11-24 LAB — MAGNESIUM: Magnesium: 1.8 mg/dL (ref 1.7–2.4)

## 2021-11-24 NOTE — TOC Progression Note (Signed)
Transition of Care [facility]) - Progression Note    Patient Details  Name: Corey [name] MRN: [id number] Date of Birth: Apr 03, 1946  Transition of [facility]) CM/SW Contact  Purcell Mouton, RN Phone Number: 11/24/2021, 2:33 PM  Clinical Narrative:    [name] was called again. Bed offer was given for pt to come to facility. Will need PT eval for insurance. Pt and wife Corey [name] was made aware.    Expected Discharge Plan: [place] Barriers to Discharge: No Barriers Identified  Expected Discharge Plan and Services Expected Discharge Plan: [place]   Discharge Planning Services: CM Consult Post Acute Care Choice: [place] Living arrangements for the past 2 months: [place] Determinants of Health (SDOH) Interventions    Readmission Risk Interventions Readmission Risk Prevention Plan 08/05/2021  Transportation Screening Complete  PCP or Specialist appointment within 3-5 days of discharge Complete  HRI or [place] Complete  SW Recovery Care/Counseling Consult Complete  [place] Not Applicable  Some recent data might be hidden

## 2021-11-24 NOTE — Progress Notes (Signed)
Progress Note   Patient: Corey [name] KZL:[id number] DOB: 07-05-1946 DOA: 11/21/2021     3 DOS: the patient was seen and examined on 11/24/2021   Brief hospital course: Corey [name] is a 76 y.o. male with PMH stage IV NSCLC with liver mets, PAF, CAD, chronic low back pain, anemia, anxiety, osteoarthritis, HLD, HTN, GERD, OSA on CPAP, prostate cancer.  He was recently discharged to rehab but after arriving there were issues with his O2 concentrator and he was sent back to the hospital. He remains on 4L chronic oxygen.  On presentation he had also redeveloped A-fib with RVR.  Lung imaging also revealed concern for new right-sided pneumonia and he was started on antibiotics.  Cardiology was also consulted to assist with his RVR as he was started on a Cardizem drip on admission.  Assessment and Plan: * Chronic respiratory failure with hypoxia - On 4 L chronically -Will need to make sure facility able to accommodate his concentrator prior to discharging back  Atrial fibrillation with RVR (Corey [name])- (present on admission) - No longer on Tikosyn as was stopped last hospitalization   - Cardiology following, appreciate assistance -Overall his cardiac function is very sensitive to his level of deconditioning.  Long talk had with patient and wife bedside that CIR may not be in fact appropriate as he does develop RVR with minimal exertion and he may better be served with lower exertion rehab at a skilled facility.  They overall were in agreement with this.  Also informed them that he would most likely end up rehospitalized for similar as time continues especially with his underlying lung cancer and generalized deconditioning - s/p cardizem drip on admission; was able to be titrated off - now on diltiazem 360 mg daily, amiodarone 200 mg daily, Toprol 25 mg daily (if adjustment needed, could try increasing Toprol further if BP able to tolerate) - TSH normal and baseline LFTs have been obtained per cardiology  recommendations - amiodarone to continue likely indefinitely unless severe thyroid issues or transaminitis -Outpatient follow-up with A-fib clinic on 12/02/2021  CAP (community acquired pneumonia) - New right-sided infiltrates noted on CTA - s/p vancomycin and cefepime on admission; previously MRSA negative; okay to continue on monotherapy cefepime for now (appropriate given recent abx and hospitalization); could target ~7 day course and de-escalate to [facility] if discharged before course complete   AF (paroxysmal atrial fibrillation) (Corey [name]) Is on Xarelto, amiodarone, Cardizem, Toprol  Physical deconditioning- (present on admission) - Plan is to return to rehab at discharge; CIR declined to reevaluate -CM to further discuss with wife regarding plans to discharge back to previous rehab facility versus pursuing alternative  Non-small cell carcinoma of left lung, stage 4 (Corey [name])- (present on admission) Stage IV lung cancer.  Patient followed by oncology, Dr. [name]  DNR (do not resuscitate) Verified DNR status.  COPD (chronic obstructive pulmonary disease) (HCC) Continue prednisone taper.  Supplemental oxygen dependent Continue supplemental oxygen at 4 L a minute.  Secondary hypercoagulable state (Corey [name]) Continue Xarelto  Morbid obesity (Corey [name])- (present on admission) Chronic  Essential hypertension Continue home blood pressure medication  Dyslipidemia- (present on admission) - Statin discontinued due to lack of long-term benefit at this time  Benign essential HTN- (present on admission) - Continue Cardizem and Toprol -Watch BP as was on the soft side last hospitalization  Coronary artery disease- (present on admission) - Statin discontinued per cardiology due to lack of mortality benefit at this time - Continue Toprol - Continue Xarelto  Subjective: No events overnight.  Wife present bedside this morning.  Overall he seems to be doing well.  Energy remaining as good as has  been and breathing is comfortable.  Physical Exam: Vitals:   11/24/21 0625 11/24/21 0754 11/24/21 0858 11/24/21 1220  BP: 123/81  115/78 122/75  Pulse: 90  (!) 124 73  Resp: 20  19 18   Temp: 98.1 F (36.7 C)  98.4 F (36.9 C) 98.3 F (36.8 C)  TempSrc: Oral  Oral Oral  SpO2: 99% 97% 99% 99%  Weight:      Height:       Physical Exam Constitutional:      General: He is not in acute distress.    Appearance: He is well-developed.  HENT:     Head: Normocephalic and atraumatic.     Mouth/Throat:     Mouth: Mucous membranes are moist.  Eyes:     Extraocular Movements: Extraocular movements intact.  Cardiovascular:     Rate and Rhythm: Normal rate. Rhythm irregular.  Pulmonary:     Comments: Mild coarse breath sounds bilaterally; relatively stable Abdominal:     General: Bowel sounds are normal. There is no distension.     Palpations: Abdomen is soft.     Tenderness: There is no abdominal tenderness.  Musculoskeletal:        General: Normal range of motion.     Cervical back: Normal range of motion and neck supple.  Skin:    General: Skin is dry.  Neurological:     General: No focal deficit present.     Mental Status: He is alert.  Psychiatric:        Mood and Affect: Mood normal.        Behavior: Behavior normal.    Data Reviewed:  Results for orders placed or performed during the hospital encounter of 11/21/21 (from the past 24 hour(s))  TSH     Status: None   Collection Time: 11/23/21  8:53 PM  Result Value Ref Range   TSH 1.286 0.350 - 4.500 uIU/mL  Basic metabolic panel     Status: Abnormal   Collection Time: 11/24/21  3:08 AM  Result Value Ref Range   Sodium 136 135 - 145 mmol/L   Potassium 4.3 3.5 - 5.1 mmol/L   Chloride 104 98 - 111 mmol/L   CO2 26 22 - 32 mmol/L   Glucose, Bld 118 (H) 70 - 99 mg/dL   BUN 28 (H) 8 - 23 mg/dL   Creatinine, Ser 0.81 0.61 - 1.24 mg/dL   Calcium 8.8 (L) 8.9 - 10.3 mg/dL   GFR, Estimated >60 >60 mL/min   Anion gap 6 5 -  15  CBC with Differential/Platelet     Status: Abnormal   Collection Time: 11/24/21  3:08 AM  Result Value Ref Range   WBC 9.9 4.0 - 10.5 K/uL   RBC 2.68 (L) 4.22 - 5.81 MIL/uL   Hemoglobin 9.2 (L) 13.0 - 17.0 g/dL   HCT 29.6 (L) 39.0 - 52.0 %   MCV 110.4 (H) 80.0 - 100.0 fL   MCH 34.3 (H) 26.0 - 34.0 pg   MCHC 31.1 30.0 - 36.0 g/dL   RDW 18.1 (H) 11.5 - 15.5 %   Platelets 274 150 - 400 K/uL   nRBC 0.2 0.0 - 0.2 %   Neutrophils Relative % 75 %   Neutro Abs 7.5 1.7 - 7.7 K/uL   Lymphocytes Relative 4 %   Lymphs Abs 0.4 (L) 0.7 -  4.0 K/uL   Monocytes Relative 12 %   Monocytes Absolute 1.1 (H) 0.1 - 1.0 K/uL   Eosinophils Relative 1 %   Eosinophils Absolute 0.1 0.0 - 0.5 K/uL   Basophils Relative 1 %   Basophils Absolute 0.1 0.0 - 0.1 K/uL   Immature Granulocytes 7 %   Abs Immature Granulocytes 0.66 (H) 0.00 - 0.07 K/uL  Magnesium     Status: None   Collection Time: 11/24/21  3:08 AM  Result Value Ref Range   Magnesium 1.8 1.7 - 2.4 mg/dL    I have Reviewed nursing notes, Vitals, and Lab results since pt's last encounter. Pertinent lab results : see above I have ordered test including BMP, CBC, Mg I have reviewed the last note from staff over past 24 hours I have discussed pt's care plan and test results with nursing staff, case manager  Antimicrobials: Vancomycin 11/21/2021 >> 2/19 Cefepime 11/21/2021 >> current  Consultants: Cardiology  Procedures:    DVT ppx:   rivaroxaban (XARELTO) tablet 20 mg     Code Status: Full Code   Family Communication:   Disposition: Status is: Inpatient Remains inpatient appropriate because: Treatment as outlined in A&P    Planned Discharge Destination: Skilled nursing facility   Author: Dwyane Dee, MD 11/24/2021 1:25 PM  For on call review [email or web address].

## 2021-11-25 DIAGNOSIS — J449 Chronic obstructive pulmonary disease, unspecified: Secondary | ICD-10-CM

## 2021-11-25 LAB — CBC WITH DIFFERENTIAL/PLATELET
Abs Immature Granulocytes: 0.71 10*3/uL — ABNORMAL HIGH (ref 0.00–0.07)
Basophils Absolute: 0.1 10*3/uL (ref 0.0–0.1)
Basophils Relative: 1 %
Eosinophils Absolute: 0.1 10*3/uL (ref 0.0–0.5)
Eosinophils Relative: 1 %
HCT: 29.9 % — ABNORMAL LOW (ref 39.0–52.0)
Hemoglobin: 9.3 g/dL — ABNORMAL LOW (ref 13.0–17.0)
Immature Granulocytes: 6 %
Lymphocytes Relative: 5 %
Lymphs Abs: 0.5 10*3/uL — ABNORMAL LOW (ref 0.7–4.0)
MCH: 34.1 pg — ABNORMAL HIGH (ref 26.0–34.0)
MCHC: 31.1 g/dL (ref 30.0–36.0)
MCV: 109.5 fL — ABNORMAL HIGH (ref 80.0–100.0)
Monocytes Absolute: 1.3 10*3/uL — ABNORMAL HIGH (ref 0.1–1.0)
Monocytes Relative: 12 %
Neutro Abs: 8.8 10*3/uL — ABNORMAL HIGH (ref 1.7–7.7)
Neutrophils Relative %: 75 %
Platelets: 281 10*3/uL (ref 150–400)
RBC: 2.73 MIL/uL — ABNORMAL LOW (ref 4.22–5.81)
RDW: 17.9 % — ABNORMAL HIGH (ref 11.5–15.5)
WBC: 11.5 10*3/uL — ABNORMAL HIGH (ref 4.0–10.5)
nRBC: 0 % (ref 0.0–0.2)

## 2021-11-25 LAB — BASIC METABOLIC PANEL
Anion gap: 4 — ABNORMAL LOW (ref 5–15)
BUN: 27 mg/dL — ABNORMAL HIGH (ref 8–23)
CO2: 27 mmol/L (ref 22–32)
Calcium: 9.1 mg/dL (ref 8.9–10.3)
Chloride: 103 mmol/L (ref 98–111)
Creatinine, Ser: 0.72 mg/dL (ref 0.61–1.24)
GFR, Estimated: >60 mL/min (ref >60)
Glucose, Bld: 105 mg/dL — ABNORMAL HIGH (ref 70–99)
Potassium: 4.2 mmol/L (ref 3.5–5.1)
Sodium: 134 mmol/L — ABNORMAL LOW (ref 135–145)

## 2021-11-25 LAB — MAGNESIUM: Magnesium: 1.9 mg/dL (ref 1.7–2.4)

## 2021-11-25 NOTE — TOC Progression Note (Signed)
Transition of Care [facility]) - Progression Note    Patient Details  Name: Corey [name] MRN: [id number] Date of Birth: 06-08-1946  Transition [facility]) CM/SW Contact  Purcell Mouton, RN Phone Number: 11/25/2021, 1:02 PM  Clinical Narrative:     Everlene Balls insurance auth started.   Expected Discharge Plan: [place] Barriers to Discharge: No Barriers Identified  Expected Discharge Plan and Services Expected Discharge Plan: [place]   Discharge Planning Services: CM Consult Post Acute Care Choice: [place] Living arrangements for the past 2 months: [place] Determinants of Health (SDOH) Interventions    Readmission Risk Interventions Readmission Risk Prevention Plan 08/05/2021  Transportation Screening Complete  PCP or Specialist appointment within 3-5 days of discharge Complete  HRI or [place] Complete  SW Recovery Care/Counseling Consult Complete  [place] Not Applicable  Some recent data might be hidden

## 2021-11-25 NOTE — Evaluation (Signed)
Occupational Therapy Evaluation Patient Details Name: Corey [name] MRN: [id number] DOB: 1946/01/28 Today's Date: 11/25/2021   History of Present Illness 76yo male recently discharged to rehab but after arriving there were issues with his O2 concentrator and he was sent back to the hospital. He remains on 4L chronic oxygen. On presentation he had also redeveloped A-fib with RVR. Lung imaging also revealed concern for new right-sided pneumonia and he was started on antibiotics. Cardiology was also consulted to assist with his RVR as he was started on a Cardizem drip on admission. Pt admitted 2/18 with chronic respiratory failure with hypoxia. PMH: NSCLC on chemo, Afib, CAD, chronic LBP with radiculopathy, HLD, HTN, prostate CA, CVA, ACDF, back surgery, L rotator cuff repair, TKA   Clinical Impression   Corey [name] is a 76 year old man who presents with generalized weakness, poor activity tolerance and cardiopulmonary impairments requiring HR control and 4 L [place]. He reports fatigue this afternoon after PT this morning and toileting prior to return to bed and reports he may not be able to perform at his best.He is supervision for bed mobility and min guard for standing at side of bed. He requires assistance for LB ADLs and toileting and reports some moderate fatigue with just evaluation. Patient will benefit from skilled OT services while in hospital to improve deficits and learn compensatory strategies as needed in order to return to PLOF.  He continues to be highly motivated to improve his deficits in order to return home. Recommend SNF at discharge.      Recommendations for follow up therapy are one component of a multi-disciplinary discharge planning process, led by the attending physician.  Recommendations may be updated based on patient status, additional functional criteria and insurance authorization.   Follow Up Recommendations  Skilled nursing-short term rehab (<3 hours/day)    Assistance  Recommended at Discharge Frequent or constant Supervision/Assistance  Patient can return home with the following A little help with walking and/or transfers;A little help with bathing/dressing/bathroom    Functional Status Assessment  Patient has had a recent decline in their functional status and demonstrates the ability to make significant improvements in function in a reasonable and predictable amount of time.  Equipment Recommendations  None recommended by OT    Recommendations for Other Services       Precautions / Restrictions Precautions Precautions: Fall Precaution Comments: O2 dep at baseline Restrictions Weight Bearing Restrictions: No      Mobility Bed Mobility                    Transfers                          Balance Overall balance assessment: Needs assistance Sitting-balance support: No upper extremity supported, Feet supported Sitting balance-Leahy Scale: Good     Standing balance support: During functional activity, Reliant on assistive device for balance Standing balance-Leahy Scale: Poor Standing balance comment: reliant on walker                           ADL either performed or assessed with clinical judgement   ADL Overall ADL's : Needs assistance/impaired Eating/Feeding: Independent   Grooming: Set up;Sitting   Upper Body Bathing: Set up;Sitting   Lower Body Bathing: Minimal assistance;Sit to/from stand   Upper Body Dressing : Set up;Sitting   Lower Body Dressing: Sit to/from stand;Moderate assistance Lower Body Dressing  Details (indicate cue type and reason): able to don socks in seated position - Toilet Transfer: Minimal assistance;Rolling walker (2 wheels);BSC/3in1   Toileting- Clothing Manipulation and Hygiene: Maximal assistance;Sit to/from stand       Functional mobility during ADLs: Rolling walker (2 wheels);Min guard General ADL Comments: Overall supervision for bed mobility and min guard for  standing with RW. [place] on 4 L [place] and HR and o2 sat WFL.     Vision Baseline Vision/History: 1 Wears glasses       Perception     Praxis      Pertinent Vitals/Pain Pain Assessment Pain Assessment: No/denies pain     Hand Dominance     Extremity/Trunk Assessment Upper Extremity Assessment Upper Extremity Assessment: Defer to OT evaluation RUE Deficits / Details: WFL ROM, 4/5 shoulder strength otherwise 5/5 throughout, UE tremulous RUE Sensation: WNL RUE Coordination: WNL LUE Deficits / Details: WFL ROM, 4/5 shoulder strength otherwise 5/5 throughout, UE tremulous LUE Sensation: WNL LUE Coordination: WNL   Lower Extremity Assessment Lower Extremity Assessment: Defer to PT evaluation   Cervical / Trunk Assessment Cervical / Trunk Assessment: Normal   Communication Communication Communication: No difficulties   Cognition Arousal/Alertness: Awake/alert Behavior During Therapy: WFL for tasks assessed/performed Overall Cognitive Status: Within Functional Limits for tasks assessed                                       General Comments  Pt on 4L O2 with SpO2 94-98%, dyspnea 3/4 with mobility    Exercises     Shoulder Instructions      Home Living Family/patient expects to be discharged to:: Skilled nursing facility                                 Additional Comments: pt recently d/c to SNF but returned due to O2 concentrator issues, plan to return prior to return home with spouse      Prior Functioning/Environment               Mobility Comments: using a walker in home, getting HH PT, had gotten well enough to get up 14 steps to bedroom, walk up and down the driveway ADLs Comments: Independent with ADLs, bathing at the sink        OT Problem List: Decreased strength;Decreased activity tolerance;Impaired balance (sitting and/or standing)      OT Treatment/Interventions: Self-care/ADL training;Therapeutic exercise;DME  and/or AE instruction;Therapeutic activities;Balance training;Patient/family education    OT Goals(Current goals can be found in the care plan section) Acute Rehab OT Goals Patient Stated Goal: improve endurance OT Goal Formulation: With patient Time For Goal Achievement: 12/09/21 Potential to Achieve Goals: Good  OT Frequency: Min 2X/week    Co-evaluation              AM-PAC OT "6 Clicks" Daily Activity     Outcome Measure Help from another person eating meals?: None Help from another person taking care of personal grooming?: A Little Help from another person toileting, which includes using toliet, bedpan, or urinal?: A Lot Help from another person bathing (including washing, rinsing, drying)?: A Little Help from another person to put on and taking off regular upper body clothing?: A Little Help from another person to put on and taking off regular lower body clothing?: A Lot 6 Click Score: 17  End of Session Equipment Utilized During Treatment: Oxygen;Rolling walker (2 wheels) Nurse Communication: Mobility status  Activity Tolerance: Patient limited by fatigue Patient left: in bed;with call bell/phone within reach;with bed alarm set;with family/visitor present  OT Visit Diagnosis: Unsteadiness on feet (R26.81);Muscle weakness (generalized) (M62.81)                Time: 0929-5747 OT Time Calculation (min): 23 min Charges:  OT General Charges $OT Visit: 1 Visit OT Evaluation $OT Eval Low Complexity: 1 Low  Christophere Hillhouse, OTR/L [place]  Office [phone] Pager: [place] 11/25/2021, 2:34 PM

## 2021-11-25 NOTE — Plan of Care (Signed)
°  Problem: Education: Goal: Knowledge of disease or condition will improve Outcome: Progressing   Problem: Activity: Goal: Ability to tolerate increased activity will improve Outcome: Progressing   Problem: Health Behavior/Discharge Planning: Goal: Ability to safely manage health-related needs after discharge will improve Outcome: Progressing   Problem: Nutrition: Goal: Adequate nutrition will be maintained Outcome: Progressing

## 2021-11-25 NOTE — Progress Notes (Signed)
Progress Note   Patient: Corey [name] NLG:[id number] DOB: 11-24-1945 DOA: 11/21/2021     4 DOS: the patient was seen and examined on 11/25/2021   Brief hospital course: 76 y.o. male with PMH stage IV NSCLC with liver mets, PAF, CAD, chronic low back pain, anemia, anxiety, osteoarthritis, HLD, HTN, GERD, OSA on CPAP, prostate cancer.  He was recently discharged to rehab but after arriving, there were issues with his O2 concentrator and he was sent back to the hospital. He remains on 4L chronic oxygen.  On presentation he had also redeveloped A-fib with RVR.  Lung imaging also revealed concern for new right-sided pneumonia and he was started on antibiotics.  Cardiology was also consulted to assist with his RVR as he was started on a Cardizem drip on admission.  Subsequently, his condition has stabilized and he is currently stable to be discharged back to SNF.  Assessment and Plan: * Chronic respiratory failure with hypoxia - On 4 L chronically -Will need to make sure facility is able to accommodate his concentrator prior to discharging back -Currently on 3 L oxygen via nasal cannula.  COPD (chronic obstructive pulmonary disease) (HCC) -Continue nebs and prednisone taper.  CAP (community acquired pneumonia) - New right-sided infiltrates noted on CTA - s/p vancomycin and cefepime on admission; previously MRSA negative; okay to continue on monotherapy cefepime for now (appropriate given recent abx and hospitalization); could target ~7 day course and de-escalate to [facility] if discharged before course complete   Atrial fibrillation with RVR ([place])- (present on admission) - s/p cardizem drip on admission; was able to be titrated off - now on diltiazem 360 mg daily, amiodarone 200 mg daily, Toprol 25 mg daily (if adjustment needed, could try increasing Toprol further if BP able to tolerate) -TSH normal and baseline LFTs have been obtained per cardiology recommendations - amiodarone to continue likely  indefinitely unless severe thyroid issues or transaminitis -Outpatient follow-up with A-fib clinic on 12/02/2021.  Cardiology has signed off. -Continue Xarelto  Non-small cell carcinoma of left lung, stage 4 ([place])- (present on admission) Stage IV lung cancer.  Patient followed by oncology, Dr. [name]  Coronary artery disease- (present on admission) - Statin discontinued per cardiology due to lack of mortality benefit at this time - Continue Toprol - Continue Xarelto  Benign essential HTN- (present on admission) - Continue Cardizem and Toprol -Watch BP as was on the soft side last hospitalization  Dyslipidemia- (present on admission) - Statin discontinued due to lack of long-term benefit at this time  Morbid obesity ([place])- (present on admission) Chronic  Physical deconditioning- (present on admission) - Plan is to return to rehab at discharge; CIR declined to reevaluate -CM to further discuss with wife regarding plans to discharge back to previous rehab facility versus pursuing alternative  Supplemental oxygen dependent Continue supplemental oxygen at 4 L a minute.  Secondary hypercoagulable state ([place]) Continue Xarelto  Essential hypertension Continue home blood pressure medication  AF (paroxysmal atrial fibrillation) (HCC) Is on Xarelto, amiodarone, Cardizem, Toprol   Subjective:  Patient seen and examined at bedside.  Denies worsening fever, nausea, vomiting, chest pain or shortness of breath.  Physical Exam: Vitals:   11/24/21 2109 11/25/21 0639 11/25/21 0757 11/25/21 1210  BP: 135/69 109/75  126/83  Pulse: 85 89  82  Resp: 20 20  18   Temp: 98.1 F (36.7 C) 98 F (36.7 C)  98.1 F (36.7 C)  TempSrc: Oral Oral  Oral  SpO2: 100% 98% 100% 98%  Weight:  Height:       General: No acute distress, currently on 3 L oxygen via nasal cannula. ENT/neck: No elevated JVD.  No obvious masses  respiratory: Bilateral decreased breath sounds at bases with some  scattered crackles CVS: S1-S2 heard, rate controlled Abdominal: Soft, nontender, nondistended, no organomegaly, bowel sounds heard Extremities: No cyanosis, clubbing; trace lower extremity edema present  CNS: Alert, awake and oriented.  No focal neurologic deficit.  Moving extremities. Lymph: No cervical lymphadenopathy Skin: No rashes, lesions, ulcers Psych: Normal mood, affect and judgment Musculoskeletal: No obvious joint deformity/tenderness/swelling   Data Reviewed: I have reviewed patient's investigations during this hospitalization myself.  Today sodium is 134, potassium of 4.2, creatinine of 0.72, WBC of 11.5, hemoglobin of 9.3, platelets of 281.  Family Communication: None at bedside  Disposition: Status is: Inpatient Remains inpatient appropriate because: Of need for SNF placement.  Currently medically stable for discharge.          Planned Discharge Destination: Skilled nursing facility     Time spent: 50 minutes  Author: Aline August, MD 11/25/2021 1:21 PM  For on call review [email or web address].

## 2021-11-25 NOTE — Evaluation (Signed)
Physical Therapy Evaluation Patient Details Name: Corey [name] MRN: [id number] DOB: Feb 14, 1946 Today's Date: 11/25/2021  History of Present Illness  76yo male recently discharged to rehab but after arriving there were issues with his O2 concentrator and he was sent back to the hospital. He remains on 4L chronic oxygen. On presentation he had also redeveloped A-fib with RVR. Lung imaging also revealed concern for new right-sided pneumonia and he was started on antibiotics. Cardiology was also consulted to assist with his RVR as he was started on a Cardizem drip on admission. Pt admitted 2/18 with chronic respiratory failure with hypoxia. PMH: NSCLC on chemo, Afib, CAD, chronic LBP with radiculopathy, HLD, HTN, prostate CA, CVA, ACDF, back surgery, L rotator cuff repair, TKA   Clinical Impression  Pt admitted with above diagnosis. Pt recently admitted and familiar to therapist, requiring +2 assist with limited steps in room due to fatigue and weakness. Today, pt requiring min guard-min A with limited steps in room, able to power up to RW and walk 4 ft in room with min A to steady, therapist managing all lines for safety, dyspnea 3/4 and SpO2 94% on 4L O2. Pt tolerates remaining up in chair, able to perform seated exercises after seated rest break. Pt still requiring significant assist with limited mobility, would benefit from ST SNF prior to return home with spouse to assist. Pt currently with functional limitations due to the deficits listed below (see PT Problem List). Pt will benefit from skilled PT to increase their independence and safety with mobility to allow discharge to the venue listed below.          Recommendations for follow up therapy are one component of a multi-disciplinary discharge planning process, led by the attending physician.  Recommendations may be updated based on patient status, additional functional criteria and insurance authorization.  Follow Up Recommendations Skilled  nursing-short term rehab (<3 hours/day)    Assistance Recommended at Discharge Frequent or constant Supervision/Assistance  Patient can return home with the following  A lot of help with walking and/or transfers;A lot of help with bathing/dressing/bathroom;Assistance with cooking/housework;Assist for transportation;Help with stairs or ramp for entrance    Equipment Recommendations None recommended by PT  Recommendations for Other Services       Functional Status Assessment Patient has had a recent decline in their functional status and demonstrates the ability to make significant improvements in function in a reasonable and predictable amount of time.     Precautions / Restrictions Precautions Precautions: Fall Precaution Comments: O2 dep at baseline Restrictions Weight Bearing Restrictions: No      Mobility  Bed Mobility Overal bed mobility: Needs Assistance Bed Mobility: Supine to Sit  Supine to sit: Min guard, HOB elevated  General bed mobility comments: increased time, heavy use of bedrail to upright trunk and scoot out to EOB, therapist managing lines for safety    Transfers Overall transfer level: Needs assistance Equipment used: Rolling walker (2 wheels)  Sit to Stand: Min guard, From elevated surface, Min assist  General transfer comment: min G to power to stand from elevated bed with RW to steady, min A to power up from low seated recliner, VC for hand placement, braces BLE against seated surface for assistance    Ambulation/Gait Ambulation/Gait assistance: Min assist Gait Distance (Feet): 4 Feet Assistive device: Rolling walker (2 wheels) Gait Pattern/deviations: Step-to pattern, Decreased stride length, Trunk flexed Gait velocity: decreased  General Gait Details: pt able to take steps at bedside and over to  recliner, stooped posture, dyspnea 3/4, clears feet with each step but limited by fatigue  Stairs            Wheelchair Mobility    Modified  [place] (Stroke Patients Only)       Balance Overall balance assessment: Needs assistance Sitting-balance support: No upper extremity supported, Feet supported Sitting balance-Leahy Scale: Good  Standing balance support: During functional activity, Reliant on assistive device for balance Standing balance-Leahy Scale: Poor Standing balance comment: reliant on walker       Pertinent Vitals/Pain Pain Assessment Pain Assessment: No/denies pain    Home Living Family/patient expects to be discharged to:: Skilled nursing facility  Additional Comments: pt recently d/c to SNF but returned due to O2 concentrator issues, plan to return prior to return home with spouse    Prior Function  Mobility Comments: using a walker in home, getting HH PT, had gotten well enough to get up 14 steps to bedroom, walk up and down the driveway ADLs Comments: Independent with ADLs, bathing at the sink     Hand Dominance        Extremity/Trunk Assessment   Upper Extremity Assessment Upper Extremity Assessment: Defer to OT evaluation    Lower Extremity Assessment Lower Extremity Assessment: Generalized weakness (AROM WNL, strength grossly 3+/5, reports "a little" numbness and tingling in bil feet)       Communication   Communication: No difficulties  Cognition Arousal/Alertness: Awake/alert Behavior During Therapy: WFL for tasks assessed/performed Overall Cognitive Status: Within Functional Limits for tasks assessed     General Comments General comments (skin integrity, edema, etc.): Pt on 4L O2 with SpO2 94-98%, dyspnea 3/4 with mobility    Exercises General Exercises - Lower Extremity Long Arc Quad: Seated, AROM, Strengthening, Both, 10 reps Hip Flexion/Marching: Seated, AROM, Strengthening, Both, 10 reps   Assessment/Plan    PT Assessment Patient needs continued PT services  PT Problem List Decreased strength;Decreased activity tolerance;Decreased balance;Decreased mobility;Decreased  knowledge of use of DME;Cardiopulmonary status limiting activity       PT Treatment Interventions DME instruction;Gait training;Functional mobility training;Therapeutic activities;Therapeutic exercise;Balance training;Patient/family education    PT Goals (Current goals can be found in the Care Plan section)  Acute Rehab PT Goals Patient Stated Goal: go to rehab then home PT Goal Formulation: With patient Time For Goal Achievement: 12/09/21 Potential to Achieve Goals: Good    Frequency Min 3X/week     Co-evaluation               AM-PAC PT "6 Clicks" Mobility  Outcome Measure Help needed turning from your back to your side while in a flat bed without using bedrails?: A Little Help needed moving from lying on your back to sitting on the side of a flat bed without using bedrails?: A Little Help needed moving to and from a bed to a chair (including a wheelchair)?: A Little Help needed standing up from a chair using your arms (e.g., wheelchair or bedside chair)?: A Little Help needed to walk in hospital room?: A Lot Help needed climbing 3-5 steps with a railing? : Total 6 Click Score: 15    End of Session Equipment Utilized During Treatment: Gait belt;Oxygen Activity Tolerance: Patient tolerated treatment well;Patient limited by fatigue Patient left: in chair;with call bell/phone within reach;with family/visitor present Nurse Communication: Mobility status PT Visit Diagnosis: Muscle weakness (generalized) (M62.81);Difficulty in walking, not elsewhere classified (R26.2)    Time: 9390-3009 PT Time Calculation (min) (ACUTE ONLY): 20 min   Charges:  PT Evaluation $PT Eval Moderate Complexity: 1 Mod           Tori Avory Mimbs PT, DPT 11/25/21, 12:17 PM

## 2021-11-26 ENCOUNTER — Inpatient Hospital Stay: Payer: Medicare PPO | Admitting: Adult Health

## 2021-11-26 DIAGNOSIS — R5381 Other malaise: Secondary | ICD-10-CM | POA: Diagnosis not present

## 2021-11-26 DIAGNOSIS — I4891 Unspecified atrial fibrillation: Secondary | ICD-10-CM | POA: Diagnosis not present

## 2021-11-26 DIAGNOSIS — C3490 Malignant neoplasm of unspecified part of unspecified bronchus or lung: Secondary | ICD-10-CM | POA: Diagnosis not present

## 2021-11-26 DIAGNOSIS — J9611 Chronic respiratory failure with hypoxia: Secondary | ICD-10-CM | POA: Diagnosis not present

## 2021-11-26 DIAGNOSIS — M62830 Muscle spasm of back: Secondary | ICD-10-CM | POA: Diagnosis not present

## 2021-11-26 DIAGNOSIS — Z743 Need for continuous supervision: Secondary | ICD-10-CM | POA: Diagnosis not present

## 2021-11-26 DIAGNOSIS — R0902 Hypoxemia: Secondary | ICD-10-CM | POA: Diagnosis not present

## 2021-11-26 DIAGNOSIS — Z741 Need for assistance with personal care: Secondary | ICD-10-CM | POA: Diagnosis not present

## 2021-11-26 DIAGNOSIS — C778 Secondary and unspecified malignant neoplasm of lymph nodes of multiple regions: Secondary | ICD-10-CM | POA: Diagnosis not present

## 2021-11-26 DIAGNOSIS — C3412 Malignant neoplasm of upper lobe, left bronchus or lung: Secondary | ICD-10-CM | POA: Diagnosis present

## 2021-11-26 DIAGNOSIS — C61 Malignant neoplasm of prostate: Secondary | ICD-10-CM | POA: Diagnosis not present

## 2021-11-26 DIAGNOSIS — Z9981 Dependence on supplemental oxygen: Secondary | ICD-10-CM | POA: Diagnosis not present

## 2021-11-26 DIAGNOSIS — C3492 Malignant neoplasm of unspecified part of left bronchus or lung: Secondary | ICD-10-CM | POA: Diagnosis not present

## 2021-11-26 DIAGNOSIS — Z7189 Other specified counseling: Secondary | ICD-10-CM | POA: Diagnosis not present

## 2021-11-26 DIAGNOSIS — R55 Syncope and collapse: Secondary | ICD-10-CM | POA: Diagnosis not present

## 2021-11-26 DIAGNOSIS — J449 Chronic obstructive pulmonary disease, unspecified: Secondary | ICD-10-CM | POA: Diagnosis not present

## 2021-11-26 DIAGNOSIS — I2510 Atherosclerotic heart disease of native coronary artery without angina pectoris: Secondary | ICD-10-CM | POA: Diagnosis not present

## 2021-11-26 DIAGNOSIS — R531 Weakness: Secondary | ICD-10-CM | POA: Diagnosis not present

## 2021-11-26 DIAGNOSIS — C787 Secondary malignant neoplasm of liver and intrahepatic bile duct: Secondary | ICD-10-CM | POA: Diagnosis not present

## 2021-11-26 DIAGNOSIS — Z9989 Dependence on other enabling machines and devices: Secondary | ICD-10-CM | POA: Diagnosis not present

## 2021-11-26 DIAGNOSIS — N400 Enlarged prostate without lower urinary tract symptoms: Secondary | ICD-10-CM | POA: Diagnosis not present

## 2021-11-26 DIAGNOSIS — Z5111 Encounter for antineoplastic chemotherapy: Secondary | ICD-10-CM | POA: Diagnosis not present

## 2021-11-26 DIAGNOSIS — Z79899 Other long term (current) drug therapy: Secondary | ICD-10-CM | POA: Diagnosis not present

## 2021-11-26 DIAGNOSIS — J441 Chronic obstructive pulmonary disease with (acute) exacerbation: Secondary | ICD-10-CM | POA: Diagnosis not present

## 2021-11-26 DIAGNOSIS — J189 Pneumonia, unspecified organism: Secondary | ICD-10-CM | POA: Diagnosis not present

## 2021-11-26 DIAGNOSIS — Z7409 Other reduced mobility: Secondary | ICD-10-CM | POA: Diagnosis not present

## 2021-11-26 DIAGNOSIS — C3432 Malignant neoplasm of lower lobe, left bronchus or lung: Secondary | ICD-10-CM | POA: Diagnosis not present

## 2021-11-26 DIAGNOSIS — J910 Malignant pleural effusion: Secondary | ICD-10-CM | POA: Diagnosis not present

## 2021-11-26 DIAGNOSIS — E440 Moderate protein-calorie malnutrition: Secondary | ICD-10-CM | POA: Diagnosis not present

## 2021-11-26 DIAGNOSIS — I10 Essential (primary) hypertension: Secondary | ICD-10-CM | POA: Diagnosis not present

## 2021-11-26 DIAGNOSIS — I482 Chronic atrial fibrillation, unspecified: Secondary | ICD-10-CM | POA: Diagnosis not present

## 2021-11-26 DIAGNOSIS — K219 Gastro-esophageal reflux disease without esophagitis: Secondary | ICD-10-CM | POA: Diagnosis not present

## 2021-11-26 DIAGNOSIS — R5383 Other fatigue: Secondary | ICD-10-CM | POA: Diagnosis not present

## 2021-11-26 LAB — BASIC METABOLIC PANEL
Anion gap: 6 (ref 5–15)
BUN: 28 mg/dL — ABNORMAL HIGH (ref 8–23)
CO2: 27 mmol/L (ref 22–32)
Calcium: 9 mg/dL (ref 8.9–10.3)
Chloride: 102 mmol/L (ref 98–111)
Creatinine, Ser: 0.92 mg/dL (ref 0.61–1.24)
GFR, Estimated: >60 mL/min (ref >60)
Glucose, Bld: 101 mg/dL — ABNORMAL HIGH (ref 70–99)
Potassium: 4.4 mmol/L (ref 3.5–5.1)
Sodium: 135 mmol/L (ref 135–145)

## 2021-11-26 LAB — CBC WITH DIFFERENTIAL/PLATELET
Abs Immature Granulocytes: 0.62 10*3/uL — ABNORMAL HIGH (ref 0.00–0.07)
Basophils Absolute: 0.1 10*3/uL (ref 0.0–0.1)
Basophils Relative: 1 %
Eosinophils Absolute: 0.1 10*3/uL (ref 0.0–0.5)
Eosinophils Relative: 1 %
HCT: 28.3 % — ABNORMAL LOW (ref 39.0–52.0)
Hemoglobin: 9.1 g/dL — ABNORMAL LOW (ref 13.0–17.0)
Immature Granulocytes: 6 %
Lymphocytes Relative: 8 %
Lymphs Abs: 0.8 10*3/uL (ref 0.7–4.0)
MCH: 34.7 pg — ABNORMAL HIGH (ref 26.0–34.0)
MCHC: 32.2 g/dL (ref 30.0–36.0)
MCV: 108 fL — ABNORMAL HIGH (ref 80.0–100.0)
Monocytes Absolute: 0.7 10*3/uL (ref 0.1–1.0)
Monocytes Relative: 7 %
Neutro Abs: 8.4 10*3/uL — ABNORMAL HIGH (ref 1.7–7.7)
Neutrophils Relative %: 77 %
Platelets: 274 10*3/uL (ref 150–400)
RBC: 2.62 MIL/uL — ABNORMAL LOW (ref 4.22–5.81)
RDW: 18 % — ABNORMAL HIGH (ref 11.5–15.5)
WBC: 10.7 10*3/uL — ABNORMAL HIGH (ref 4.0–10.5)
nRBC: 0 % (ref 0.0–0.2)

## 2021-11-26 LAB — MAGNESIUM: Magnesium: 1.9 mg/dL (ref 1.7–2.4)

## 2021-11-26 MED ORDER — AMIODARONE HCL 200 MG PO TABS: 200.0000 mg | ORAL_TABLET | Freq: Every day | ORAL | Status: DC

## 2021-11-26 MED ORDER — ATORVASTATIN CALCIUM 40 MG PO TABS: 40.0000 mg | ORAL_TABLET | Freq: Every day | ORAL | Status: DC

## 2021-11-26 MED ORDER — CARBINOXAMINE MALEATE 6 MG PO TABS: 6.0000 mg | ORAL_TABLET | Freq: Two times a day (BID) | ORAL | Status: DC

## 2021-11-26 MED ORDER — TOPIRAMATE 50 MG PO TABS: 50.0000 mg | ORAL_TABLET | ORAL | Status: DC

## 2021-11-26 MED ORDER — HEPARIN SOD (PORK) LOCK FLUSH 100 UNIT/ML IV SOLN
500.0000 [IU] | INTRAVENOUS | Status: AC | PRN
  Administered 2021-11-26: 11:00:00 500 [IU]
  Filled 2021-11-26: qty 5

## 2021-11-26 MED ORDER — OMEPRAZOLE 20 MG PO CPDR: 20.0000 mg | DELAYED_RELEASE_CAPSULE | Freq: Every day | ORAL | Status: DC

## 2021-11-26 MED ORDER — CARBIDOPA-LEVODOPA 25-100 MG PO TABS: 1.0000 | ORAL_TABLET | ORAL | Status: DC

## 2021-11-26 MED ORDER — CEFUROXIME AXETIL 500 MG PO TABS: 500.0000 mg | ORAL_TABLET | Freq: Two times a day (BID) | ORAL | 0 refills | Status: AC

## 2021-11-26 MED ORDER — POLYETHYLENE GLYCOL 3350 17 G PO PACK: 17.0000 g | PACK | Freq: Every day | ORAL | Status: AC

## 2021-11-26 MED ORDER — ALPRAZOLAM 0.5 MG PO TABS: 0.5000 mg | ORAL_TABLET | Freq: Every evening | ORAL | 0 refills | Status: DC | PRN

## 2021-11-26 MED ORDER — DILTIAZEM HCL ER COATED BEADS 360 MG PO CP24: 360.0000 mg | ORAL_CAPSULE | Freq: Every day | ORAL | 0 refills | Status: AC

## 2021-11-26 NOTE — TOC Progression Note (Signed)
Transition of Care [facility]) - Progression Note    Patient Details  Name: Corey [name] MRN: [id number] Date of Birth: 1946-03-14  Transition of Care [facility]) CM/SW Contact  Purcell Mouton, RN Phone Number: 11/26/2021, 11:06 AM  Clinical Narrative:    Corey Harold was called, here to transport pt. [name] was called pt was called to confirm for pt's wife that pt is on 4L [place] and [place]. [name] is aware.    Expected Discharge Plan: [place] Barriers to Discharge: No Barriers Identified  Expected Discharge Plan and Services Expected Discharge Plan: [place]   Discharge Planning Services: CM Consult Post Acute Care Choice: [place] Living arrangements for the past 2 months: [place] Expected Discharge Date: 11/26/21                                     Social Determinants of Health (SDOH) Interventions    Readmission Risk Interventions Readmission Risk Prevention Plan 08/05/2021  Transportation Screening Complete  PCP or Specialist appointment within 3-5 days of discharge Complete  HRI or [place] Complete  SW Recovery Care/Counseling Consult Complete  [place] Not Applicable  Some recent data might be hidden

## 2021-11-26 NOTE — Discharge Summary (Signed)
Physician Discharge Summary   Patient: Corey [name] MRN: [id number] DOB: 1946/08/11  Admit date:     11/21/2021  Discharge date: 11/26/21  Discharge Physician: Aline August   PCP: [name], MD   Recommendations at discharge:   Follow-up with SNF provider at earliest convenience Outpatient follow-up with oncology/cardiology/palliative care follow-up in the ED if symptoms worsen apnea.   Hospital Course: 76 y.o. male with PMH stage IV NSCLC with liver mets, PAF, CAD, chronic low back pain, anemia, anxiety, osteoarthritis, HLD, HTN, GERD, OSA on CPAP, prostate cancer.  He was recently discharged to rehab but after arriving, there were issues with his O2 concentrator and he was sent back to the hospital. He remains on 4L chronic oxygen.  On presentation he had also redeveloped A-fib with RVR.  Lung imaging also revealed concern for new right-sided pneumonia and he was started on antibiotics.  Cardiology was also consulted to assist with his RVR as he was started on a Cardizem drip on admission.  Subsequently, his condition has stabilized and he is currently stable to be discharged back to SNF.  He will be discharged to SNF once bed is available.  Discharge diagnosis and assessment and Plan: * Chronic respiratory failure with hypoxia - On 4 L chronically -Will need to make sure facility is able to accommodate his concentrator prior to discharging back   COPD (chronic obstructive pulmonary disease) (HCC) -Continue nebs and prednisone taper.  CAP (community acquired pneumonia) - New right-sided infiltrates noted on CTA - s/p vancomycin and cefepime on admission; previously MRSA negative; -Currently on cefepime.  Switch to oral Ceftin for 2 more days on discharge. -Discharge to SNF today.  Atrial fibrillation with RVR ([place])- (present on admission) - s/p cardizem drip on admission; was able to be titrated off - now on diltiazem 360 mg daily, amiodarone 200 mg daily, Toprol 25 mg  daily (if adjustment needed, could try increasing Toprol further if BP able to tolerate) -TSH normal and baseline LFTs have been obtained per cardiology recommendations - amiodarone to continue likely indefinitely unless severe thyroid issues or transaminitis -Outpatient follow-up with A-fib clinic on 12/02/2021.  Cardiology has signed off. -Continue Xarelto  Non-small cell carcinoma of left lung, stage 4 ([place])- (present on admission) Stage IV lung cancer.  Patient followed by oncology, Dr. [name]  Coronary artery disease- (present on admission) - Statin discontinued per cardiology due to lack of mortality benefit at this time - Continue Toprol - Continue Xarelto  Benign essential HTN- (present on admission) - Continue Cardizem and Toprol   Dyslipidemia- (present on admission) - Statin discontinued due to lack of long-term benefit at this time  Morbid obesity ([place])- (present on admission) Chronic  Physical deconditioning- (present on admission) - Plan is to return to rehab at discharge; CIR declined to reevaluate   Supplemental oxygen dependent Continue supplemental oxygen at 4 L a minute.  Secondary hypercoagulable state ([place]) Continue Xarelto  Essential hypertension Continue home blood pressure medication  AF (paroxysmal atrial fibrillation) ([place]) Is on Xarelto, amiodarone, Cardizem, Toprol    Consultants: Cardiology/palliative care Procedures performed: None Disposition: Skilled nursing facility Diet recommendation:  Discharge Diet Orders (From admission, onward)     Start     Ordered   11/26/21 0000  Diet - low sodium heart healthy        11/26/21 0955           Cardiac diet  DISCHARGE MEDICATION: Allergies as of 11/26/2021       Reactions  Amoxicillin Anaphylaxis, Swelling   Tolerates Rocephin   Fosinopril Other (See Comments), Cough   Muscle aches (Monopril)   Hydromorphone Nausea And Vomiting   Ampicillin Rash   Codeine Nausea Only, Other  (See Comments)   Heavy amounts cause nausea and stomach upset   Rosuvastatin Other (See Comments)   Joint pain        Medication List     STOP taking these medications    atorvastatin 40 MG tablet Commonly known as: LIPITOR   traMADol 50 MG tablet Commonly known as: ULTRAM       TAKE these medications    acetaminophen 325 MG tablet Commonly known as: TYLENOL Take 2 tablets (650 mg total) by mouth every 6 (six) hours as needed for mild pain (or Fever >/= 101).   albuterol (2.5 MG/3ML) 0.083% nebulizer solution Commonly known as: PROVENTIL Inhale 3 mLs into the lungs every 4 (four) hours as needed for wheezing or shortness of breath. What changed:  when to take this additional instructions   ALPRAZolam 0.5 MG tablet Commonly known as: XANAX Take 1 tablet (0.5 mg total) by mouth at bedtime as needed for anxiety.   amiodarone 200 MG tablet Commonly known as: PACERONE Take 1 tablet (200 mg total) by mouth daily.   benzonatate 200 MG capsule Commonly known as: TESSALON Take 1 capsule (200 mg total) by mouth 3 (three) times daily. What changed: when to take this   budesonide 0.5 MG/2ML nebulizer solution Commonly known as: PULMICORT Take 2 mLs (0.5 mg total) by nebulization 2 (two) times daily. What changed: when to take this   carbidopa-levodopa 25-100 MG tablet Commonly known as: SINEMET IR Take 1 tablet by mouth See admin instructions. Take 1 tablet by mouth at 8 AM, 1 PM, 7 PM, and bedtime (10 PM)   Carbinoxamine Maleate 6 MG Tabs Commonly known as: RyVent Take 6 mg by mouth in the morning and at bedtime.   cefUROXime 500 MG tablet Commonly known as: CEFTIN Take 1 tablet (500 mg total) by mouth 2 (two) times daily for 2 days.   DELSYM COUGH RELIEF MT Use as directed 10 mLs in the mouth or throat at bedtime as needed (for cough).   diltiazem 360 MG 24 hr capsule Commonly known as: CARDIZEM CD Take 1 capsule (360 mg total) by mouth daily. Start taking  on: November 27, 2021 What changed:  medication strength how much to take   fenofibrate 160 MG tablet TAKE 1 TABLET DAILY. PLEASE KEEP UPCOMING APPT IN MARCH WITH DR. Acie Fredrickson BEFORE ANYMORE REFILLS. What changed:  how much to take how to take this when to take this additional instructions   fluticasone 50 MCG/ACT nasal spray Commonly known as: FLONASE Place 2 sprays into both nostrils daily.   folic acid 1 MG tablet Commonly known as: FOLVITE Take 1 tablet (1 mg total) by mouth daily.   guaiFENesin 600 MG 12 hr tablet Commonly known as: MUCINEX Take 2 tablets (1,200 mg total) by mouth 2 (two) times daily.   ipratropium 0.03 % nasal spray Commonly known as: ATROVENT Place 1-2 sprays into both nostrils every 12 (twelve) hours. For drainage What changed:  how much to take when to take this additional instructions   levalbuterol 0.63 MG/3ML nebulizer solution Commonly known as: XOPENEX Take 3 mLs (0.63 mg total) by nebulization 2 (two) times daily.   loratadine 10 MG tablet Commonly known as: CLARITIN Take 1 tablet (10 mg total) by mouth daily as needed for allergies (  nasal drainage). What changed: reasons to take this   magnesium oxide 400 (240 Mg) MG tablet Commonly known as: MAG-OX Take 1 tablet (400 mg total) by mouth daily.   metoprolol succinate 25 MG 24 hr tablet Commonly known as: TOPROL-XL Take 1 tablet (25 mg total) by mouth daily.   multivitamins ther. w/minerals Tabs tablet Take 1 tablet by mouth daily with breakfast.   nitroGLYCERIN 0.4 MG SL tablet Commonly known as: NITROSTAT Place 1 tablet (0.4 mg total) under the tongue every 5 (five) minutes as needed for chest pain.   omeprazole 20 MG capsule Commonly known as: PRILOSEC Take 1 capsule (20 mg total) by mouth daily before breakfast.   ondansetron 8 MG tablet Commonly known as: ZOFRAN Take 8 mg by mouth every 8 (eight) hours as needed for nausea or vomiting.   polyethylene glycol 17 g  packet Commonly known as: MIRALAX / GLYCOLAX Take 17 g by mouth daily.   potassium chloride 10 MEQ tablet Commonly known as: KLOR-CON TAKE 1 TABLET BY MOUTH EVERY DAY What changed:  when to take this additional instructions   predniSONE 20 MG tablet Commonly known as: DELTASONE Take 1 tablet (20 mg total) by mouth daily with breakfast for 3 days. What changed: Another medication with the same name was removed. Continue taking this medication, and follow the directions you see here.   predniSONE 10 MG tablet Commonly known as: DELTASONE Take 1 tablet (10 mg total) by mouth daily with breakfast for 3 days. Start taking on: December 01, 2021 What changed: Another medication with the same name was removed. Continue taking this medication, and follow the directions you see here.   predniSONE 5 MG tablet Commonly known as: DELTASONE Take 1 tablet (5 mg total) by mouth daily with breakfast for 3 days. Start taking on: December 08, 2021 What changed: Another medication with the same name was removed. Continue taking this medication, and follow the directions you see here.   Probiotic 250 MG Caps Take 250 mg by mouth daily.   rivaroxaban 20 MG Tabs tablet Commonly known as: Xarelto Take 1 tablet (20 mg total) by mouth daily. What changed: when to take this   senna-docusate 8.6-50 MG tablet Commonly known as: Senokot-S Take 1 tablet by mouth 2 (two) times daily.   Stiolto Respimat 2.5-2.5 MCG/ACT Aers Generic drug: Tiotropium Bromide-Olodaterol Inhale 2 puffs into the lungs daily.   tamsulosin 0.4 MG Caps capsule Commonly known as: FLOMAX Take 1 capsule (0.4 mg total) by mouth daily after supper.   topiramate 50 MG tablet Commonly known as: TOPAMAX Take 1-2 tablets (50-100 mg total) by mouth See admin instructions. Take 50mg  by mouth in the morning, then 100mg  at night   Vitamin D (Ergocalciferol) 1.25 MG (50000 UNIT) Caps capsule Commonly known as: DRISDOL Take 1 capsule  (50,000 Units total) by mouth every 7 (seven) days. Monday What changed:  when to take this additional instructions        Follow-up Information     [facility] Follow up on 12/02/2021.   Specialty: Cardiology Why: Appointment at 1:30 PM Contact information: [address] [id number] [place]        Curt Bears, MD. Schedule an appointment as soon as possible for a visit in 1 week(s).   Specialty: Oncology Contact information: [place] [address] [phone]               Subjective: Patient seen  and examined at bedside.  Feels okay to go to rehab today.  Denies any worsening shortness of breath, fever or vomiting.  Discharge Exam: Filed Weights   11/21/21 0217  Weight: 104.9 kg   General: No acute distress, currently on 4 L oxygen via nasal cannula.   respiratory: Bilateral decreased breath sounds at bases with some scattered crackles CVS: S1-S2 heard, rate controlled Abdominal: Soft, nontender, nondistended, no organomegaly, bowel sounds heard Extremities: No cyanosis, clubbing; trace lower extremity edema present   Condition at discharge: fair  The results of significant diagnostics from this hospitalization (including imaging, microbiology, ancillary and laboratory) are listed below for reference.   Imaging Studies: DG Chest 2 View  Result Date: 11/03/2021 CLINICAL DATA:  Cough EXAM: CHEST - 2 VIEW COMPARISON:  CT chest 10/15/2021 FINDINGS: Cardiomegaly and mediastinum appear unchanged. Calcified plaques in the aortic arch. Left-sided atrial loop recorder. Right-sided central venous port with the tip in the region of the SVC. Heart and mediastinum are deviated to the left similar to previous. Extensive irregular opacities throughout the left lung again seen with relative sparing in the upper lung zone, similar to previous. Chronic changes in the right  lung base. No significant pleural effusion. No pneumothorax visualized. IMPRESSION: No significant change since previous study including extensive irregular opacities in the left lung. Electronically Signed   By: Ofilia Neas M.D.   On: 11/03/2021 10:55   CT Angio Chest PE W and/or Wo Contrast  Result Date: 11/21/2021 CLINICAL DATA:  Hypoxia.  Shortness of breath.  PE suspected. EXAM: CT ANGIOGRAPHY CHEST WITH CONTRAST TECHNIQUE: Multidetector CT imaging of the chest was performed using the standard protocol during bolus administration of intravenous contrast. Multiplanar CT image reconstructions and MIPs were obtained to evaluate the vascular anatomy. RADIATION DOSE REDUCTION: This exam was performed according to the departmental dose-optimization program which includes automated exposure control, adjustment of the mA and/or kV according to patient size and/or use of iterative reconstruction technique. CONTRAST:  60mL OMNIPAQUE IOHEXOL 350 MG/ML SOLN COMPARISON:  10/15/2021 FINDINGS: Cardiovascular: Heart is enlarged. Coronary artery calcification is evident. Mild atherosclerotic calcification is noted in the wall of the thoracic aorta. Enlargement of the pulmonary outflow tract/main pulmonary arteries suggests pulmonary arterial hypertension. There is no filling defect within the opacified pulmonary arteries to suggest the presence of an acute pulmonary embolus. Mediastinum/Nodes: No mediastinal lymphadenopathy. There is no hilar lymphadenopathy. The esophagus has normal imaging features. There is no axillary lymphadenopathy. Lungs/Pleura: Marked volume loss with extensive pleuroparenchymal scarring in the left hemithorax is similar to prior, presumably treatment related although there is progressive consolidative opacity and volume loss at the left base. Interval increase in interstitial and ill-defined ground-glass opacity in the posterior right lower lobe with patchy areas of subtle ground-glass  opacity in the right upper lobe. Small right pleural effusion is new in the interval. Upper Abdomen: Multiple ill-defined liver lesions again noted. Caudate lobe lesion appears progressive in the interval measuring 5.7 x 5.6 cm today compared to 4.1 x 3.2 cm previously. 1.4 cm lesion in the inferior right liver measured previously is 2.3 cm today on 283/6. 2.2 cm lesion measured in the dome of the liver on the prior study is 3.5 cm today on image 219/6. Musculoskeletal: No worrisome lytic or sclerotic osseous abnormality. Review of the MIP images confirms the above findings. IMPRESSION: 1. No CT evidence for acute pulmonary embolus. 2. Enlargement of the pulmonary outflow tract/main pulmonary arteries suggests pulmonary arterial hypertension. 3. Marked volume loss with  extensive pleuroparenchymal scarring in the left hemithorax, presumably treatment related although there is progressive consolidative opacity and volume loss at the left base. Features may be related to infection/inflammation although disease progression could certainly have this appearance. 4. Interval increase in interstitial and ill-defined ground-glass opacity in the posterior right lower lobe with patchy areas of subtle ground-glass opacity in the right upper lobe. Findings likely reflect multifocal right-sided pneumonia. 5. Small right pleural effusion, new in the interval. 6. Interval progression of multiple ill-defined liver lesions consistent with metastatic disease. 7. Aortic Atherosclerosis (ICD10-I70.0). Electronically Signed   By: Misty Stanley M.D.   On: 11/21/2021 10:31   MR BRAIN W WO CONTRAST  Result Date: 11/11/2021 CLINICAL DATA:  Follow-up examination for stroke. EXAM: MRI HEAD WITHOUT AND WITH CONTRAST TECHNIQUE: Multiplanar, multiecho pulse sequences of the brain and surrounding structures were obtained without and with intravenous contrast. CONTRAST:  13mL GADAVIST GADOBUTROL 1 MMOL/ML IV SOLN COMPARISON:  Prior CT from  earlier the same day. FINDINGS: Brain: Examination moderately degraded by motion artifact. Generalized age-related cerebral atrophy. Patchy and confluent T2/FLAIR hyperintensity involving the periventricular deep white matter both cerebral hemispheres most consistent with chronic small vessel ischemic disease, mild to moderate in nature. Area of encephalomalacia and gliosis involving the left parietal lobe, consistent with a chronic ischemic infarct, posterior left MCA distribution. No abnormal foci of restricted diffusion to suggest acute or subacute ischemia. Gray-white matter differentiation otherwise maintained. No other areas of chronic cortical infarction. No visible acute or chronic intracranial blood products. No mass lesion, midline shift or mass effect. Mild ventricular prominence related to global parenchymal volume loss without hydrocephalus. No extra-axial fluid collection. Partially empty sella noted. Midline structures intact. No visible abnormal enhancement. Vascular: Major intracranial vascular flow voids are maintained. Skull and upper cervical spine: Craniocervical junction within normal limits. Bone marrow signal intensity grossly normal. No scalp soft tissue abnormality. Sinuses/Orbits: Globes and orbital soft tissues demonstrate no acute finding. Mild mucosal thickening noted within the ethmoidal air cells. Paranasal sinuses are otherwise clear. Trace right mastoid effusion noted, of doubtful significance. Other: None. IMPRESSION: 1. No acute intracranial infarct or other abnormality. 2. Chronic left parietal infarct. 3. Underlying mild to moderate chronic microvascular ischemic disease. Electronically Signed   By: Jeannine Boga M.D.   On: 11/11/2021 00:32   CT HEAD CODE STROKE WO CONTRAST  Result Date: 11/10/2021 CLINICAL DATA:  Code stroke. Initial evaluation for neuro deficit, stroke suspected. EXAM: CT HEAD WITHOUT CONTRAST TECHNIQUE: Contiguous axial images were obtained from the  base of the skull through the vertex without intravenous contrast. RADIATION DOSE REDUCTION: This exam was performed according to the departmental dose-optimization program which includes automated exposure control, adjustment of the mA and/or kV according to patient size and/or use of iterative reconstruction technique. COMPARISON:  CT from 07/06/2012. FINDINGS: Brain: Generalized age-related cerebral atrophy with chronic microvascular ischemic disease. Remote posterior left MCA distribution infarct involving the left parietal lobe noted. No acute intracranial hemorrhage. No acute large vessel territory infarct. No mass lesion or midline shift. No hydrocephalus or extra-axial fluid collection. Vascular: No hyperdense vessel. Scattered vascular calcifications noted within the carotid siphons. Skull: Scalp soft tissues and calvarium within normal limits. Sinuses/Orbits: Globes and orbital soft tissues demonstrate no acute finding. Paranasal sinuses and mastoid air cells are clear. Other: None. ASPECTS [facility] Stroke Program Early CT Score) - Ganglionic level infarction (caudate, lentiform nuclei, internal capsule, insula, M1-M3 cortex): 7 - Supraganglionic infarction (M4-M6 cortex): 3 Total score (0-10 with 10  being normal): 10 IMPRESSION: 1. No acute intracranial abnormality. 2. ASPECTS is 10. 3. Chronic left parietal lobe infarct, posterior left MCA distribution. 4. Underlying age-related cerebral atrophy with chronic microvascular ischemic disease. These results were communicated to Dr. Sabino Gasser At 12:12 am on 11/10/2021 by text page via the [facility] messaging system. Electronically Signed   By: Jeannine Boga M.D.   On: 11/10/2021 00:13   VAS [place] LOWER EXTREMITY VENOUS (DVT)  Result Date: 11/07/2021  Lower Venous DVT Study Patient Name:  Corey [name]  Date of Exam:   11/06/2021 Medical Rec #: [id number]    Accession #:    [id number] Date of Birth: April 05, 1946   Patient Gender: M Patient Age:   58 years Exam  Location:  [facility] Procedure:      VAS [place] LOWER EXTREMITY VENOUS (DVT) Referring Phys: Dwyane Dee --------------------------------------------------------------------------------  Indications: Swelling - LLE.  Risk Factors: CA patient on chemotherapy. Anticoagulation: Xarelto. Comparison Study: Previous exam on 11/03/2021 was negative for DVT Performing Technologist: Rogelia Rohrer RVT, RDMS  Examination Guidelines: A complete evaluation includes B-mode imaging, spectral Doppler, color Doppler, and power Doppler as needed of all accessible portions of each vessel. Bilateral testing is considered an integral part of a complete examination. Limited examinations for reoccurring indications may be performed as noted. The reflux portion of the exam is performed with the patient in reverse Trendelenburg.  +---------+---------------+---------+-----------+----------+--------------+  RIGHT     Compressibility Phasicity Spontaneity Properties Thrombus Aging  +---------+---------------+---------+-----------+----------+--------------+  CFV       Full            Yes       Yes                                    +---------+---------------+---------+-----------+----------+--------------+  SFJ       Full                                                             +---------+---------------+---------+-----------+----------+--------------+  FV Prox   Full            Yes       Yes                                    +---------+---------------+---------+-----------+----------+--------------+  FV Mid    Full            Yes       Yes                                    +---------+---------------+---------+-----------+----------+--------------+  FV Distal Full            Yes       Yes                                    +---------+---------------+---------+-----------+----------+--------------+  PFV       Full                                                              +---------+---------------+---------+-----------+----------+--------------+  POP       Full            Yes       Yes                                    +---------+---------------+---------+-----------+----------+--------------+  PTV       Full                                                             +---------+---------------+---------+-----------+----------+--------------+  PERO      Full                                                             +---------+---------------+---------+-----------+----------+--------------+   +---------+---------------+---------+-----------+----------+--------------+  LEFT      Compressibility Phasicity Spontaneity Properties Thrombus Aging  +---------+---------------+---------+-----------+----------+--------------+  CFV       Full            Yes       Yes                                    +---------+---------------+---------+-----------+----------+--------------+  SFJ       Full                                                             +---------+---------------+---------+-----------+----------+--------------+  FV Prox   Full            Yes       Yes                                    +---------+---------------+---------+-----------+----------+--------------+  FV Mid    Full            Yes       Yes                                    +---------+---------------+---------+-----------+----------+--------------+  FV Distal Full            Yes       Yes                                    +---------+---------------+---------+-----------+----------+--------------+  PFV       Full                                                             +---------+---------------+---------+-----------+----------+--------------+  POP       Full            Yes       Yes                                    +---------+---------------+---------+-----------+----------+--------------+  PTV       Full                                                              +---------+---------------+---------+-----------+----------+--------------+  PERO      Full                                                             +---------+---------------+---------+-----------+----------+--------------+     Summary: BILATERAL: - No evidence of deep vein thrombosis seen in the lower extremities, bilaterally. - No evidence of superficial venous thrombosis in the lower extremities, bilaterally. -No evidence of popliteal cyst, bilaterally.   *See table(s) above for measurements and observations. Electronically signed by Harold Barban MD on 11/07/2021 at 5:28:58 PM.    Final    VAS [place] LOWER EXTREMITY VENOUS (DVT) (7a-7p)  Result Date: 11/03/2021  Lower Venous DVT Study Patient Name:  Corey [name]  Date of Exam:   11/03/2021 Medical Rec #: [id number]    Accession #:    [id number] Date of Birth: 1946/09/10   Patient Gender: M Patient Age:   85 years Exam Location:  [facility] Procedure:      VAS [place] LOWER EXTREMITY VENOUS (DVT) Referring Phys: Thelma Comp NANAVATI --------------------------------------------------------------------------------  Indications: Pain.  Risk Factors: None identified. Comparison Study: No prior studies. Performing Technologist: Oliver Hum RVT  Examination Guidelines: A complete evaluation includes B-mode imaging, spectral Doppler, color Doppler, and power Doppler as needed of all accessible portions of each vessel. Bilateral testing is considered an integral part of a complete examination. Limited examinations for reoccurring indications may be performed as noted. The reflux portion of the exam is performed with the patient in reverse Trendelenburg.  +-----+---------------+---------+-----------+----------+--------------+  RIGHT Compressibility Phasicity Spontaneity Properties Thrombus Aging  +-----+---------------+---------+-----------+----------+--------------+  CFV   Full            Yes       Yes                                     +-----+---------------+---------+-----------+----------+--------------+   +---------+---------------+---------+-----------+----------+--------------+  LEFT      Compressibility Phasicity Spontaneity Properties Thrombus Aging  +---------+---------------+---------+-----------+----------+--------------+  CFV       Full            Yes       Yes                                    +---------+---------------+---------+-----------+----------+--------------+  SFJ       Full                                                             +---------+---------------+---------+-----------+----------+--------------+  FV Prox   Full                                                             +---------+---------------+---------+-----------+----------+--------------+  FV Mid    Full                                                             +---------+---------------+---------+-----------+----------+--------------+  FV Distal Full                                                             +---------+---------------+---------+-----------+----------+--------------+  PFV       Full                                                             +---------+---------------+---------+-----------+----------+--------------+  POP       Full            Yes       Yes                                    +---------+---------------+---------+-----------+----------+--------------+  PTV       Full                                                             +---------+---------------+---------+-----------+----------+--------------+  PERO      Full                                                             +---------+---------------+---------+-----------+----------+--------------+    Summary: RIGHT: - No evidence of common femoral vein obstruction.  LEFT: - There is no evidence of deep vein thrombosis in the lower extremity.  - No cystic structure found in the popliteal fossa.  *See table(s) above for measurements and observations. Electronically signed  by Deitra Mayo MD on 11/03/2021 at 3:47:23 PM.    Final    CT ANGIO HEAD NECK W WO CM W PERF (CODE STROKE)  Result Date: 11/10/2021 CLINICAL DATA:  Initial evaluation for neuro deficit, stroke. EXAM: CT ANGIOGRAPHY HEAD AND NECK TECHNIQUE: Multidetector CT imaging of the head and neck was performed using the standard protocol during bolus administration of intravenous contrast. Multiplanar CT image reconstructions and MIPs were obtained to evaluate the vascular  anatomy. Carotid stenosis measurements (when applicable) are obtained utilizing NASCET criteria, using the distal internal carotid diameter as the denominator. RADIATION DOSE REDUCTION: This exam was performed according to the departmental dose-optimization program which includes automated exposure control, adjustment of the mA and/or kV according to patient size and/or use of iterative reconstruction technique. CONTRAST:  121mL OMNIPAQUE IOHEXOL 350 MG/ML SOLN COMPARISON:  Prior head CT from earlier the same day. FINDINGS: CTA NECK FINDINGS Aortic arch: Visualized aortic arch normal in caliber with normal branch pattern. Mild-to-moderate atheromatous change about the arch and origin of the great vessels without hemodynamically significant stenosis. Right carotid system: Right CCA patent from its origin to the bifurcation without stenosis. Moderate atheromatous plaque about the right carotid bulb/proximal right ICA without significant stenosis. Right ICA patent distally without stenosis or dissection. Left carotid system: Left CCA patent from its origin to the bifurcation without stenosis. Prominent atheromatous plaque about the left carotid bulb/proximal left ICA with associated stenosis of up to approximately 70-80% by NASCET criteria (series 9, image 210). Exact measurements difficult given motion and streak artifact through this region. Left ICA tortuous but otherwise patent distally to the skull base. Vertebral arteries: Both vertebral arteries  arise from the subclavian arteries. No significant proximal subclavian artery stenosis. Atheromatous change about the origins and proximal V1 segments bilaterally with associated moderate up to approximate 50% stenoses. Vertebral arteries otherwise patent distally without stenosis or dissection. Skeleton: No discrete or worrisome osseous lesions. Prior fusion at C5-C6. Other neck: No other acute soft tissue abnormality within the neck. Upper chest: Question possible filling defects involving the left main pulmonary artery, which could reflect acute pulmonary embolus (series 9, image 396). Finding not entirely certain given timing of the contrast bolus on this exam. Consolidative opacity noted within the visualized left lung similar to previous. Bilateral pleural effusions partially visualized. Degree of underlying pulmonary interstitial edema noted. Review of the MIP images confirms the above findings CTA HEAD FINDINGS Anterior circulation: Examination somewhat limited by timing the contrast bolus. Petrous segments patent bilaterally. Atheromatous change within the carotid siphons with associated mild to moderate multifocal narrowing. A1 segments patent bilaterally. Normal anterior communicating artery complex. Anterior cerebral arteries perfused to their distal aspects without visible stenosis. No M1 stenosis or occlusion. Normal MCA bifurcations. Distal MCA branches perfused and symmetric. Posterior circulation: Both V4 segments patent without stenosis. Left PICA patent. Right PICA not definitely seen. Basilar patent to its distal aspect without stenosis. Superior cerebellar arteries patent bilaterally. Both PCAs primarily supplied via the basilar well perfused or distal aspects. Venous sinuses: Patent allowing for timing the contrast bolus. Anatomic variants: None significant.  No visible aneurysm. Review of the MIP images confirms the above findings IMPRESSION: 1. Negative CTA for large vessel occlusion. 2.  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 3. Atheromatous change about the origins of both vertebral arteries with associated moderate multifocal stenoses. 4. Question filling defects involving the left main pulmonary artery, suspicious for acute pulmonary emboli. Further evaluation with dedicated cross-sectional imaging of the chest recommended. 5. Consolidative opacity within the visualized left lung, similar to prior CT from 10/15/2021. 6. Layering bilateral pleural effusions with underlying pulmonary interstitial edema, partially visualized. Critical Value/emergent results were called by telephone at the time of interpretation on 11/10/2021 at 3:23 am to the Nurse practitioner taking care of the patient, Quinn Plowman , who verbally acknowledged these results. Electronically Signed   By: Marland Kitchen  Jeannine Boga M.D.   On: 11/10/2021 03:24    Microbiology: Results for orders placed or performed during the hospital encounter of 11/21/21  Resp Panel by RT-PCR (Flu A&B, Covid) Nasopharyngeal Swab     Status: None   Collection Time: 11/21/21  3:30 AM   Specimen: Nasopharyngeal Swab; Nasopharyngeal(NP) swabs in vial transport medium  Result Value Ref Range Status   SARS Coronavirus 2 by RT PCR NEGATIVE NEGATIVE Final    Comment: (NOTE) SARS-CoV-2 target nucleic acids are NOT DETECTED.  The SARS-CoV-2 RNA is generally detectable in upper respiratory specimens during the acute phase of infection. The lowest concentration of SARS-CoV-2 viral copies this assay can detect is 138 copies/mL. A negative result does not preclude SARS-Cov-2 infection and should not be used as the sole basis for treatment or other patient management decisions. A negative result may occur with  improper specimen collection/handling, submission of specimen other than nasopharyngeal swab, presence of viral mutation(s) within the areas  targeted by this assay, and inadequate number of viral copies(<138 copies/mL). A negative result must be combined with clinical observations, patient history, and epidemiological information. The expected result is Negative.  Fact Sheet for Patients:  EntrepreneurPulse.com.au  Fact Sheet for Healthcare Providers:  IncredibleEmployment.be  This test is no t yet approved or cleared by the [place] FDA and  has been authorized for detection and/or diagnosis of SARS-CoV-2 by FDA under an Emergency Use Authorization (EUA). This EUA will remain  in effect (meaning this test can be used) for the duration of the COVID-19 declaration under Section 564(b)(1) of the Act, 21 U.S.C.section 360bbb-3(b)(1), unless the authorization is terminated  or revoked sooner.       Influenza A by PCR NEGATIVE NEGATIVE Final   Influenza B by PCR NEGATIVE NEGATIVE Final    Comment: (NOTE) The Xpert Xpress SARS-CoV-2/FLU/RSV plus assay is intended as an aid in the diagnosis of influenza from Nasopharyngeal swab specimens and should not be used as a sole basis for treatment. Nasal washings and aspirates are unacceptable for Xpert Xpress SARS-CoV-2/FLU/RSV testing.  Fact Sheet for Patients: EntrepreneurPulse.com.au  Fact Sheet for Healthcare Providers: IncredibleEmployment.be  This test is not yet approved or cleared by the [place] FDA and has been authorized for detection and/or diagnosis of SARS-CoV-2 by FDA under an Emergency Use Authorization (EUA). This EUA will remain in effect (meaning this test can be used) for the duration of the COVID-19 declaration under Section 564(b)(1) of the Act, 21 U.S.C. section 360bbb-3(b)(1), unless the authorization is terminated or revoked.  Performed at [facility], [place] [address], [place], [place] [address]   Respiratory (~20 pathogens) panel by PCR     Status:  None   Collection Time: 11/21/21  3:26 PM   Specimen: Nasopharyngeal Swab; Respiratory  Result Value Ref Range Status   Adenovirus NOT DETECTED NOT DETECTED Final   Coronavirus 229E NOT DETECTED NOT DETECTED Final    Comment: (NOTE) The Coronavirus on the Respiratory Panel, DOES NOT test for the novel  Coronavirus (2019 nCoV)    Coronavirus HKU1 NOT DETECTED NOT DETECTED Final   Coronavirus NL63 NOT DETECTED NOT DETECTED Final   Coronavirus OC43 NOT DETECTED NOT DETECTED Final   Metapneumovirus NOT DETECTED NOT DETECTED Final   Rhinovirus / Enterovirus NOT DETECTED NOT DETECTED Final   Influenza A NOT DETECTED NOT DETECTED Final   Influenza B NOT DETECTED NOT DETECTED Final   Parainfluenza Virus 1 NOT DETECTED NOT DETECTED Final   Parainfluenza Virus 2  NOT DETECTED NOT DETECTED Final   Parainfluenza Virus 3 NOT DETECTED NOT DETECTED Final   Parainfluenza Virus 4 NOT DETECTED NOT DETECTED Final   Respiratory Syncytial Virus NOT DETECTED NOT DETECTED Final   Bordetella pertussis NOT DETECTED NOT DETECTED Final   Bordetella Parapertussis NOT DETECTED NOT DETECTED Final   Chlamydophila pneumoniae NOT DETECTED NOT DETECTED Final   Mycoplasma pneumoniae NOT DETECTED NOT DETECTED Final    Comment: Performed at [facility], [place] [address] [place], [place] [address]    Labs: CBC: Recent Labs  Lab 11/21/21 [phone] 11/22/21 0200 11/23/21 0349 11/24/21 0308 11/25/21 0343 11/26/21 0408  WBC 11.6* 10.3 9.5 9.9 11.5* 10.7*  NEUTROABS 8.7*  --  8.0* 7.5 8.8* 8.4*  HGB 9.4* 9.0* 8.7* 9.2* 9.3* 9.1*  HCT 30.1* 28.5* 27.3* 29.6* 29.9* 28.3*  MCV 109.1* 109.6* 109.2* 110.4* 109.5* 108.0*  PLT 266 269 242 274 281 476   Basic Metabolic Panel: Recent Labs  Lab 11/21/21 0937 11/22/21 0200 11/23/21 0349 11/24/21 0308 11/25/21 0343 11/26/21 0408  NA 133* 135 134* 136 134* 135  K 3.9 4.1 3.9 4.3 4.2 4.4  CL 100 102 102 104 103 102  CO2 26 25 26 26 27 27   GLUCOSE 103* 124* 102*  118* 105* 101*  BUN 24* 25* 26* 28* 27* 28*  CREATININE 0.78 0.87 0.88 0.81 0.72 0.92  CALCIUM 9.0 8.7* 8.8* 8.8* 9.1 9.0  MG 1.8  --  1.8 1.8 1.9 1.9   Liver Function Tests: Recent Labs  Lab 11/21/21 0937 11/22/21 0200 11/23/21 0349  AST 18 16 15   ALT 7 11 10   ALKPHOS 56 55 58  BILITOT 0.3 0.1* 0.2*  PROT 6.6 6.3* 5.6*  ALBUMIN 3.1* 3.0* 2.7*   CBG: No results for input(s): GLUCAP in the last 168 hours.  Discharge time spent: greater than 30 minutes.  Signed: Aline August, MD [facility] 11/26/2021

## 2021-11-27 DIAGNOSIS — N400 Enlarged prostate without lower urinary tract symptoms: Secondary | ICD-10-CM | POA: Diagnosis not present

## 2021-11-27 DIAGNOSIS — R531 Weakness: Secondary | ICD-10-CM | POA: Diagnosis not present

## 2021-11-27 DIAGNOSIS — C3490 Malignant neoplasm of unspecified part of unspecified bronchus or lung: Secondary | ICD-10-CM | POA: Diagnosis not present

## 2021-11-27 DIAGNOSIS — Z7409 Other reduced mobility: Secondary | ICD-10-CM | POA: Diagnosis not present

## 2021-11-27 DIAGNOSIS — I482 Chronic atrial fibrillation, unspecified: Secondary | ICD-10-CM | POA: Diagnosis not present

## 2021-11-27 DIAGNOSIS — I2510 Atherosclerotic heart disease of native coronary artery without angina pectoris: Secondary | ICD-10-CM | POA: Diagnosis not present

## 2021-11-27 DIAGNOSIS — K219 Gastro-esophageal reflux disease without esophagitis: Secondary | ICD-10-CM | POA: Diagnosis not present

## 2021-11-29 LAB — CUP PACEART REMOTE DEVICE CHECK
Date Time Interrogation Session: 20230224234143
Implantable Pulse Generator Implant Date: 20210526

## 2021-11-30 ENCOUNTER — Ambulatory Visit (INDEPENDENT_AMBULATORY_CARE_PROVIDER_SITE_OTHER): Payer: Medicare PPO

## 2021-11-30 DIAGNOSIS — R55 Syncope and collapse: Secondary | ICD-10-CM

## 2021-12-01 ENCOUNTER — Encounter: Payer: Self-pay | Admitting: Internal Medicine

## 2021-12-01 ENCOUNTER — Telehealth (HOSPITAL_COMMUNITY): Payer: Self-pay | Admitting: *Deleted

## 2021-12-01 NOTE — Telephone Encounter (Signed)
[name] care was contacted by telephone to verify understanding of discharge instructions status post their most recent discharge from the hospital on the date:  11/26/21.  Spoke to Herbie Baltimore to clarify Inpatient discharge AVS was re-reviewed with patient, along with cancer center appointments.  Verification of understanding for oncology specific follow-up was validated using the Teach Back method.  Previous message had been left on 2/23.  Transportation to appointments were confirmed for the patient as being  [facility] . Staff's questions were addressed to their satisfaction upon completion of this post discharge follow-up call for outpatient oncology.

## 2021-12-02 ENCOUNTER — Telehealth: Payer: Self-pay | Admitting: Medical Oncology

## 2021-12-02 ENCOUNTER — Ambulatory Visit (HOSPITAL_COMMUNITY): Payer: Medicare PPO | Admitting: Nurse Practitioner

## 2021-12-02 NOTE — Telephone Encounter (Signed)
Wife requests virtual visit next wed. Message sent to Cassie. ?Pt resides at [facility] and cannot be transported. ?

## 2021-12-03 DIAGNOSIS — Z741 Need for assistance with personal care: Secondary | ICD-10-CM | POA: Diagnosis not present

## 2021-12-03 DIAGNOSIS — M62830 Muscle spasm of back: Secondary | ICD-10-CM | POA: Diagnosis not present

## 2021-12-03 DIAGNOSIS — Z7409 Other reduced mobility: Secondary | ICD-10-CM | POA: Diagnosis not present

## 2021-12-03 NOTE — Progress Notes (Signed)
Carelink Summary Report / Loop Recorder 

## 2021-12-04 ENCOUNTER — Encounter: Payer: Self-pay | Admitting: Internal Medicine

## 2021-12-07 ENCOUNTER — Telehealth: Payer: Self-pay | Admitting: Pulmonary Disease

## 2021-12-07 NOTE — Telephone Encounter (Signed)
Renee (DPR) patient's wife called stating patient is currently at [facility] and therapist is wanting to work with patient to get O2 weaned down.  Patient is currently on 4L [place].  Renee requested order be called to nurse's station of [place] with Dr. Valeta Harms to start weaning O2 down. ?[facility] nurses station [phone]. ? ? ?Message routed to Dr. Valeta Harms to advise  ?

## 2021-12-07 NOTE — Progress Notes (Signed)
[facility] HEMATOLOGY-ONCOLOGY TeleHEALTH VISIT PROGRESS NOTE   I connected with Corey [name] on 12/08/21 at 11:30 AM EST by telephone and verified that I am speaking with the correct person using two identifiers.  I discussed the limitations, risks, security and privacy concerns of performing an evaluation and management service by telemedicine and the availability of in-person appointments. I also discussed with the patient that there may be a patient responsible charge related to this service. The patient expressed understanding and agreed to proceed.  Other persons participating in the visit and their role in the encounter: Mrs. 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     Molecular Studies by [employer]: [id number] 0.5%   Olaparib Yes [id number] 50.9%   Olaparib Yes [id number] 0.8% None      PDL1: 30%  PRIOR THERAPY: PRIOR THERAPY:  1) Palliative radiotherapy to the obstructive lung mass in the left lung under the care of Dr. MTammi Klippel Last treatment 03/23/21 2)  Palliative systemic chemotherapy with carboplatin AUC 5, Alimta 500 mg per metered squared, Keytruda 200 mg IV every 3 weeks.  First dose on 03/31/21. Status post 7 cycles. His dose of carboplatin was reduced to AUC of 4 and his Alimta was reduced to 400 mg/m2. Starting from cycle #5 the patient had been on maintenance Alimta and Keytruda.  The patient's treatment had been on hold from10/10/2020-10/27/21 due to hospitalizations.  He resume cycle number 7 with single agent dose reduced Alimta 400 mg per  metered squared on 10/28/2021.  The patient was hospitalized for most of February 2023 for pneumonia and A-fib with RVR. Treatment discontinued due to intolerance  CURRENT THERAPY:  None  INTERVAL HISTORY: Corey [id number].[id number]. male and I connected via a mychart video visit today. Regarding his lung cancer history, the patient started systemic chemotherapy and immunotherapy in June 2022.  The patient was hospitalized on 07/28/2021-08/12/21 for cellulitis/aspiration pneumonia versus drug-induced pneumonitis.  The patient was admitted for inpatient rehab.  He had some difficulties participating in rehab due to his debility from his acute on chronic respiratory failure.  Due to his debility, he was off treatment for his malignancy for several months.  He was seen in the clinic in January 2023 to discuss resuming treatment.  Given his history of pneumonitis and intolerance to chemotherapy, Dr. MJulien Nordmannstarted him on single agent Alimta, which was dose reduced to 400 mg per metered squared.  Dr. MJulien Nordmanndid not recommend restarting immunotherapy with Keytruda due to his history of pneumonitis.  The patient underwent 1 cycle on 10/28/2021.  He was hsopitalized 6 days later  He was last seen in the clinic on 10/20/2020.  He resumed treatment with single agent dose reduced chemotherapy with Alimta on 10/27/2021.   The patient presented to the emergency room on 11/03/2021 for the chief complaint of cough, weakness, left-sided pleuritic chest pain, and decreased appetite.  He missed his dose of Tikosyn that morning for his history of atrial fibrillation.  He was admitted to the hospital from 11/03/2021-11/20/2021 for work-up and management.  The patient was found to be in A-fib with RVR.  He is on  Tikosyn, metoprolol, and Xarelto at home.  He was managed by cardiology inpatient.  He also follows with cardiology outpatient.  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  He is currently on Lasix.  He was treated for  possible pneumonia.  The patient is on 4 L of oxygen at baseline.  Regarding his lung cancer, palliative care was consulted while inpatient.  Per chart review, the patient had not rule out possibility of chemo moving forward.  It was discussed with the patient today and his wife who are wondering what his other options are. Specifically, they are wondering about targeted treatment and radiation.  He was discharged to the rehab facility, they did not have his oxygen set up at the time and he was noted to be hypoxic on room air and was sent back to the ER.  The patient since wife is wondering if oral chemotherapy or radiation is an option.  To reiterate, the patient has stage IV lung cancer.  He has multiple ill-defined liver lesions which were progressing on hospital repeat imaging studies.    He then presented to the emergency room again on 11/21/2021 for the chief complaint of shortness of breath. He was discharged to the rehab facility the day prior, they did not have his oxygen set up at the time and he was noted to be hypoxic on room air and was sent back to the ER.  However, in the emergency room the patient continued to have uncontrolled atrial fibrillation with RVR.  He was readmitted to the hospital due to uncontrolled atrial fibrillation.  He was discharged on 11/26/2021.  Repeat lung imaging was also concerning again for right-sided pneumonia.  He was started on antibiotics.  The patient was discharged to a SNF.  He is currently residing in [place].   Overall, he is feeling fair today.  He continues to be on 4 L of oxygen via nasal cannula.  He follows closely with pulmonology. He states he is supposed to be doing nebulizer treatments BID, but it has been done once daily. He noticed he started coughing more last night and is wondering if it is because he did not do his nebulizer. He is also following closely with cardiology for his uncontrolled atrial fibrillation.  He reports dyspnea on exertion and  occasional chest tightness, specifically, near the left lower rib cage. He also notes some back discomfort. Of note, imaging from the hospital did not show any concerning osseous findings for metastatic disease.  Denies any nausea, vomiting, diarrhea, or constipation.  Denies any headache or visual changes.  Denies any fever, chills, night sweats, or unexplained weight loss.  He is here today for reevaluation discussion of his options.  MEDICAL HISTORY: Past Medical History:  Diagnosis Date   Anemia    Anxiety    Arthritis    "back, right knee, hands, ankles, neck" (05/31/2016)   Atrial fibrillation with RVR ([facility]) 11/03/2021   Carotid artery disease (HCC)    Chronic lower back pain    COPD with acute exacerbation ([facility]) 04/02/2021   Coronary atherosclerosis of native coronary artery    a. BMS to [facility] 2004 and 2007, otherwise mild nonobstructive disease. EF normal.   Diverticulitis    Dyslipidemia    Essential hypertension, benign    Family history of breast cancer    Family history of lung cancer    Family history of prostate cancer    GERD (gastroesophageal reflux disease)    Hyponatremia 06/04/2016  Lumbar radiculopathy, chronic 02/04/2015   Right L5   Lung cancer (HCC)    Mild dilation of ascending aorta ([facility]) 08/2021   Obesity    OSA on CPAP    uses cpap   Parkinson's disease ([facility])    Paroxysmal atrial fibrillation ([facility])    a. Discovered after stroke.   Pleural effusion on left 02/24/2021   Pneumonia 01/1996   PONV (postoperative nausea and vomiting)    Prostate CA ([facility])    Recurrent upper respiratory infection (URI)    Stroke ([facility]) 07/2012   no deficits   Symptomatic anemia    Visit for monitoring Tikosyn therapy 09/18/2019    ALLERGIES:  is allergic to amoxicillin, fosinopril, hydromorphone, ampicillin, codeine, and rosuvastatin.  MEDICATIONS:  Current Outpatient Medications  Medication Sig Dispense Refill   acetaminophen (TYLENOL) 325 MG tablet Take 2 tablets (650 mg  total) by mouth every 6 (six) hours as needed for mild pain (or Fever >/= 101).     albuterol (PROVENTIL) (2.5 MG/3ML) 0.083% nebulizer solution Inhale 3 mLs into the lungs every 4 (four) hours as needed for wheezing or shortness of breath. (Patient taking differently: Inhale 3 mLs into the lungs See admin instructions. Nebulize 2.5 mg & inhale into the lungs 3-4 times a day and may also use an additional 2.5 mg up to two times a day as needed for shortness of breath) 75 mL 12   ALPRAZolam (XANAX) 0.5 MG tablet Take 1 tablet (0.5 mg total) by mouth at bedtime as needed for anxiety. 10 tablet 0   amiodarone (PACERONE) 200 MG tablet Take 1 tablet (200 mg total) by mouth daily.     benzonatate (TESSALON) 200 MG capsule Take 1 capsule (200 mg total) by mouth 3 (three) times daily. (Patient taking differently: Take 200 mg by mouth in the morning, at noon, and at bedtime.) 90 capsule 0   budesonide (PULMICORT) 0.5 MG/2ML nebulizer solution Take 2 mLs (0.5 mg total) by nebulization 2 (two) times daily. (Patient taking differently: Take 0.5 mg by nebulization in the morning and at bedtime.) 60 mL 12   carbidopa-levodopa (SINEMET IR) 25-100 MG tablet Take 1 tablet by mouth See admin instructions. Take 1 tablet by mouth at 8 AM, 1 PM, 7 PM, and bedtime (10 PM)     Carbinoxamine Maleate (RYVENT) 6 MG TABS Take 6 mg by mouth in the morning and at bedtime.     Dextromethorphan-Menthol (DELSYM COUGH RELIEF MT) Use as directed 10 mLs in the mouth or throat at bedtime as needed (for cough).     diltiazem (CARDIZEM CD) 360 MG 24 hr capsule Take 1 capsule (360 mg total) by mouth daily. 30 capsule 0   fenofibrate 160 MG tablet TAKE 1 TABLET DAILY. PLEASE KEEP UPCOMING APPT IN MARCH WITH DR. Acie Fredrickson BEFORE ANYMORE REFILLS. (Patient taking differently: Take 160 mg by mouth See admin instructions. Take 160 mg by mouth at 2 PM daily) 90 tablet 3   fluticasone (FLONASE) 50 MCG/ACT nasal spray Place 2 sprays into both nostrils  daily. 70.9 mL 5   folic acid (FOLVITE) 1 MG tablet Take 1 tablet (1 mg total) by mouth daily. 90 tablet 1   guaiFENesin (MUCINEX) 600 MG 12 hr tablet Take 2 tablets (1,200 mg total) by mouth 2 (two) times daily.     ipratropium (ATROVENT) 0.03 % nasal spray Place 1-2 sprays into both nostrils every 12 (twelve) hours. For drainage (Patient taking differently: Place 2 sprays into both nostrils in the morning.)  30 mL 5   levalbuterol (XOPENEX) 0.63 MG/3ML nebulizer solution Take 3 mLs (0.63 mg total) by nebulization 2 (two) times daily. 3 mL 12   loratadine (CLARITIN) 10 MG tablet Take 1 tablet (10 mg total) by mouth daily as needed for allergies (nasal drainage). (Patient taking differently: Take 10 mg by mouth daily as needed for allergies.) 30 tablet 0   magnesium oxide (MAG-OX) 400 (240 Mg) MG tablet Take 1 tablet (400 mg total) by mouth daily. 90 tablet 2   metoprolol succinate (TOPROL-XL) 25 MG 24 hr tablet Take 1 tablet (25 mg total) by mouth daily. 90 tablet 1   Multiple Vitamins-Minerals (MULTIVITAMINS THER. W/MINERALS) TABS Take 1 tablet by mouth daily with breakfast.     nitroGLYCERIN (NITROSTAT) 0.4 MG SL tablet Place 1 tablet (0.4 mg total) under the tongue every 5 (five) minutes as needed for chest pain. (Patient not taking: Reported on 11/21/2021) 25 tablet 5   omeprazole (PRILOSEC) 20 MG capsule Take 1 capsule (20 mg total) by mouth daily before breakfast.     ondansetron (ZOFRAN) 8 MG tablet Take 8 mg by mouth every 8 (eight) hours as needed for nausea or vomiting.     polyethylene glycol (MIRALAX / GLYCOLAX) 17 g packet Take 17 g by mouth daily.     potassium chloride (KLOR-CON) 10 MEQ tablet TAKE 1 TABLET BY MOUTH EVERY DAY (Patient taking differently: Take 10 mEq by mouth See admin instructions. Take 10 mEq by mouth at 2 PM daily) 90 tablet 1   predniSONE (DELTASONE) 5 MG tablet Take 1 tablet (5 mg total) by mouth daily with breakfast for 3 days. 3 tablet 0   rivaroxaban (XARELTO) 20  MG TABS tablet Take 1 tablet (20 mg total) by mouth daily. (Patient taking differently: Take 20 mg by mouth every evening.) 90 tablet 2   Saccharomyces boulardii (PROBIOTIC) 250 MG CAPS Take 250 mg by mouth daily.     senna-docusate (SENOKOT-S) 8.6-50 MG tablet Take 1 tablet by mouth 2 (two) times daily.     tamsulosin (FLOMAX) 0.4 MG CAPS capsule Take 1 capsule (0.4 mg total) by mouth daily after supper. 30 capsule 0   Tiotropium Bromide-Olodaterol (STIOLTO RESPIMAT) 2.5-2.5 MCG/ACT AERS Inhale 2 puffs into the lungs daily. 4 g 5   topiramate (TOPAMAX) 50 MG tablet Take 1-2 tablets (50-100 mg total) by mouth See admin instructions. Take 16m by mouth in the morning, then 1041mat night     Vitamin D, Ergocalciferol, (DRISDOL) 1.25 MG (50000 UNIT) CAPS capsule Take 1 capsule (50,000 Units total) by mouth every 7 (seven) days. Monday (Patient taking differently: Take 50,000 Units by mouth every Monday.) 12 capsule 0   No current facility-administered medications for this visit.    SURGICAL HISTORY:  Past Surgical History:  Procedure Laterality Date   ADENOIDECTOMY     ANTERIOR CERVICAL DECOMP/DISCECTOMY FUSION  07/2001; 10/2002   "C5-6; C6-7; redo"   BACK SURGERY     CARPAL TUNNEL RELEASE Left 10/2015   CHEST TUBE INSERTION Left 03/17/2021   Procedure: INSERTION PLEURAL DRAINAGE CATHETER;  Surgeon: IcGarner NashDO;  [facility] Service: Pulmonary;  Laterality: Left;  Indwelling Tunneled Pleural catheter (PLEUREX)    COLONOSCOPY W/ POLYPECTOMY  02/2014   CORONARY ANGIOPLASTY WITH STENT PLACEMENT  05/2003; 12/2005   "mid RCA; mid RCA"   FINE NEEDLE ASPIRATION  02/26/2021   Procedure: FINE NEEDLE ASPIRATION (FNA) LINEAR;  Surgeon: SmCandee FurbishMD;  [facility]  Service: Pulmonary;;   HAND SURGERY  02/2019   LEFT HAND   implantable loop recorder placement  02/27/2020   Medtronic Reveal [place] model [id number]  implantable loop recorder implanted by Dr Rayann Heman  for afib management and evaluation of presyncope   IR IMAGING GUIDED PORT INSERTION  04/16/2021   JOINT REPLACEMENT     KNEE ARTHROSCOPY Left 10/2005   KNEE ARTHROSCOPY W/ PARTIAL MEDIAL MENISCECTOMY Left 09/2005   LUMBAR LAMINECTOMY/DECOMPRESSION MICRODISCECTOMY  03/2005   "L4-5"   POSTERIOR LUMBAR FUSION  10/2003   L5-S1; "plates, screws"   SHOULDER ARTHROSCOPY Right 08/2011   Debridement of labrum, arthroscopic distal clavicle excision   SHOULDER OPEN ROTATOR CUFF REPAIR Left 07/2014   TEE WITHOUT CARDIOVERSION  07/07/2012   Procedure: TRANSESOPHAGEAL ECHOCARDIOGRAM (TEE);  Surgeon: Fay Records, MD;  Location[facility] ENDOSCOPY;  Service: Cardiovascular;  Laterality: N/A;   THORACENTESIS N/A 02/25/2021   Procedure: Mathews Robinsons;  Surgeon: Juanito Doom, MD;  Location: [facility];  Service: Cardiopulmonary;  Laterality: N/A;   TONSILLECTOMY AND ADENOIDECTOMY  ~ 1956   TOTAL KNEE ARTHROPLASTY Left 10/2006   TRIGGER FINGER RELEASE Left 10/2015   VIDEO BRONCHOSCOPY WITH ENDOBRONCHIAL ULTRASOUND Left 02/26/2021   Procedure: VIDEO BRONCHOSCOPY WITH ENDOBRONCHIAL ULTRASOUND;  Surgeon: Candee Furbish, MD;  Location: [facility] ENDOSCOPY;  Service: Pulmonary;  Laterality: Left;  cryoprobe too thanks!    REVIEW OF SYSTEMS:   Review of Systems  Constitutional: Positive for fatigue. Negative for appetite change, chills, fever and unexpected weight change.  HENT:   Negative for mouth sores, nosebleeds, sore throat and trouble swallowing.   Eyes: Negative for eye problems and icterus.  Respiratory: Positive for cough x1 day. Positive for dyspnea on exertion. Negative for hemoptysis Cardiovascular: Positive for occasional left pleuritic discomfort for several weeks (CTA negative for PE). Negative for leg swelling.  Gastrointestinal: Negative for abdominal pain, constipation, diarrhea, nausea and vomiting.  Genitourinary: Negative for bladder incontinence, difficulty urinating, dysuria, frequency and  hematuria.   Musculoskeletal: Positive for back pain. Negative for gait problem, neck pain and neck stiffness.  Skin: Negative for itching and rash.  Neurological: Positive for generalized weakness. Negative for dizziness, extremity weakness, gait problem, headaches, light-headedness and seizures.  Hematological: Negative for adenopathy. Does not bruise/bleed easily.  Psychiatric/Behavioral: Negative for confusion, depression and sleep disturbance. The patient is not nervous/anxious.     PHYSICAL EXAMINATION:  There were no vitals taken for this visit.  ECOG PERFORMANCE STATUS: 3  Physical Exam  Constitutional: Oriented to person, place, and time and well-developed, well-nourished, and in no distress.  HENT:  Head: Normocephalic and atraumatic.  Pulmonary: On supplemental oxygen.  Psychiatric: Mood, memory and judgment normal.  Vitals reviewed.  LABORATORY DATA: Lab Results  Component Value Date   WBC 10.7 (H) 11/26/2021   HGB 9.1 (L) 11/26/2021   HCT 28.3 (L) 11/26/2021   MCV 108.0 (H) 11/26/2021   PLT 274 11/26/2021      Chemistry      Component Value Date/Time   NA 135 11/26/2021 0408   NA 137 01/21/2021 1553   K 4.4 11/26/2021 0408   CL 102 11/26/2021 0408   CO2 27 11/26/2021 0408   BUN 28 (H) 11/26/2021 0408   BUN 17 01/21/2021 1553   CREATININE 0.92 11/26/2021 0408   CREATININE 0.88 10/28/2021 0845   CREATININE 1.04 08/30/2016 0840      Component Value Date/Time   CALCIUM 9.0 11/26/2021 0408   ALKPHOS 58 11/23/2021 0349   AST  15 11/23/2021 0349   AST 17 10/28/2021 0845   ALT 10 11/23/2021 0349   ALT 12 10/28/2021 0845   BILITOT 0.2 (L) 11/23/2021 0349   BILITOT 0.3 10/28/2021 0845       RADIOGRAPHIC STUDIES:  CT Angio Chest PE W and/or Wo Contrast  Result Date: 11/21/2021 CLINICAL DATA:  Hypoxia.  Shortness of breath.  PE suspected. EXAM: CT ANGIOGRAPHY CHEST WITH CONTRAST TECHNIQUE: Multidetector CT imaging of the chest was performed using the  standard protocol during bolus administration of intravenous contrast. Multiplanar CT image reconstructions and MIPs were obtained to evaluate the vascular anatomy. RADIATION DOSE REDUCTION: This exam was performed according to the departmental dose-optimization program which includes automated exposure control, adjustment of the mA and/or kV according to patient size and/or use of iterative reconstruction technique. CONTRAST:  73m OMNIPAQUE IOHEXOL 350 MG/ML SOLN COMPARISON:  10/15/2021 FINDINGS: Cardiovascular: Heart is enlarged. Coronary artery calcification is evident. Mild atherosclerotic calcification is noted in the wall of the thoracic aorta. Enlargement of the pulmonary outflow tract/main pulmonary arteries suggests pulmonary arterial hypertension. There is no filling defect within the opacified pulmonary arteries to suggest the presence of an acute pulmonary embolus. Mediastinum/Nodes: No mediastinal lymphadenopathy. There is no hilar lymphadenopathy. The esophagus has normal imaging features. There is no axillary lymphadenopathy. Lungs/Pleura: Marked volume loss with extensive pleuroparenchymal scarring in the left hemithorax is similar to prior, presumably treatment related although there is progressive consolidative opacity and volume loss at the left base. Interval increase in interstitial and ill-defined ground-glass opacity in the posterior right lower lobe with patchy areas of subtle ground-glass opacity in the right upper lobe. Small right pleural effusion is new in the interval. Upper Abdomen: Multiple ill-defined liver lesions again noted. Caudate lobe lesion appears progressive in the interval measuring 5.7 x 5.6 cm today compared to 4.1 x 3.2 cm previously. 1.4 cm lesion in the inferior right liver measured previously is 2.3 cm today on 283/6. 2.2 cm lesion measured in the dome of the liver on the prior study is 3.5 cm today on image 219/6. Musculoskeletal: No worrisome lytic or sclerotic  osseous abnormality. Review of the MIP images confirms the above findings. IMPRESSION: 1. No CT evidence for acute pulmonary embolus. 2. Enlargement of the pulmonary outflow tract/main pulmonary arteries suggests pulmonary arterial hypertension. 3. Marked volume loss with extensive pleuroparenchymal scarring in the left hemithorax, presumably treatment related although there is progressive consolidative opacity and volume loss at the left base. Features may be related to infection/inflammation although disease progression could certainly have this appearance. 4. Interval increase in interstitial and ill-defined ground-glass opacity in the posterior right lower lobe with patchy areas of subtle ground-glass opacity in the right upper lobe. Findings likely reflect multifocal right-sided pneumonia. 5. Small right pleural effusion, new in the interval. 6. Interval progression of multiple ill-defined liver lesions consistent with metastatic disease. 7. Aortic Atherosclerosis (ICD10-I70.0). Electronically Signed   By: EMisty StanleyM.D.   On: 11/21/2021 10:31   MR BRAIN W WO CONTRAST  Result Date: 11/11/2021 CLINICAL DATA:  Follow-up examination for stroke. EXAM: MRI HEAD WITHOUT AND WITH CONTRAST TECHNIQUE: Multiplanar, multiecho pulse sequences of the brain and surrounding structures were obtained without and with intravenous contrast. CONTRAST:  167mGADAVIST GADOBUTROL 1 MMOL/ML IV SOLN COMPARISON:  Prior CT from earlier the same day. FINDINGS: Brain: Examination moderately degraded by motion artifact. Generalized age-related cerebral atrophy. Patchy and confluent T2/FLAIR hyperintensity involving the periventricular deep white matter both cerebral hemispheres most consistent with  chronic small vessel ischemic disease, mild to moderate in nature. Area of encephalomalacia and gliosis involving the left parietal lobe, consistent with a chronic ischemic infarct, posterior left MCA distribution. No abnormal foci of  restricted diffusion to suggest acute or subacute ischemia. Gray-white matter differentiation otherwise maintained. No other areas of chronic cortical infarction. No visible acute or chronic intracranial blood products. No mass lesion, midline shift or mass effect. Mild ventricular prominence related to global parenchymal volume loss without hydrocephalus. No extra-axial fluid collection. Partially empty sella noted. Midline structures intact. No visible abnormal enhancement. Vascular: Major intracranial vascular flow voids are maintained. Skull and upper cervical spine: Craniocervical junction within normal limits. Bone marrow signal intensity grossly normal. No scalp soft tissue abnormality. Sinuses/Orbits: Globes and orbital soft tissues demonstrate no acute finding. Mild mucosal thickening noted within the ethmoidal air cells. Paranasal sinuses are otherwise clear. Trace right mastoid effusion noted, of doubtful significance. Other: None. IMPRESSION: 1. No acute intracranial infarct or other abnormality. 2. Chronic left parietal infarct. 3. Underlying mild to moderate chronic microvascular ischemic disease. Electronically Signed   By: Jeannine Boga M.D.   On: 11/11/2021 00:32   CUP PACEART REMOTE DEVICE CHECK  Result Date: 11/29/2021 ILR summary report received. Battery status OK. Normal device function. No new symptom, tachy, brady, or pause episodes. No new AF episodes. Monthly summary reports and ROV/PRN. LH  CT HEAD CODE STROKE WO CONTRAST  Result Date: 11/10/2021 CLINICAL DATA:  Code stroke. Initial evaluation for neuro deficit, stroke suspected. EXAM: CT HEAD WITHOUT CONTRAST TECHNIQUE: Contiguous axial images were obtained from the base of the skull through the vertex without intravenous contrast. RADIATION DOSE REDUCTION: This exam was performed according to the departmental dose-optimization program which includes automated exposure control, adjustment of the mA and/or kV according to  patient size and/or use of iterative reconstruction technique. COMPARISON:  CT from 07/06/2012. FINDINGS: Brain: Generalized age-related cerebral atrophy with chronic microvascular ischemic disease. Remote posterior left MCA distribution infarct involving the left parietal lobe noted. No acute intracranial hemorrhage. No acute large vessel territory infarct. No mass lesion or midline shift. No hydrocephalus or extra-axial fluid collection. Vascular: No hyperdense vessel. Scattered vascular calcifications noted within the carotid siphons. Skull: Scalp soft tissues and calvarium within normal limits. Sinuses/Orbits: Globes and orbital soft tissues demonstrate no acute finding. Paranasal sinuses and mastoid air cells are clear. Other: None. ASPECTS [facility] Stroke Program Early CT Score) - Ganglionic level infarction (caudate, lentiform nuclei, internal capsule, insula, M1-M3 cortex): 7 - Supraganglionic infarction (M4-M6 cortex): 3 Total score (0-10 with 10 being normal): 10 IMPRESSION: 1. No acute intracranial abnormality. 2. ASPECTS is 10. 3. Chronic left parietal lobe infarct, posterior left MCA distribution. 4. Underlying age-related cerebral atrophy with chronic microvascular ischemic disease. These results were communicated to Dr. Sabino Gasser At 12:12 am on 11/10/2021 by text page via the [facility] messaging system. Electronically Signed   By: Jeannine Boga M.D.   On: 11/10/2021 00:13   CT ANGIO HEAD NECK W WO CM W PERF (CODE STROKE)  Result Date: 11/10/2021 CLINICAL DATA:  Initial evaluation for neuro deficit, stroke. EXAM: CT ANGIOGRAPHY HEAD AND NECK TECHNIQUE: Multidetector CT imaging of the head and neck was performed using the standard protocol during bolus administration of intravenous contrast. Multiplanar CT image reconstructions and MIPs were obtained to evaluate the vascular anatomy. Carotid stenosis measurements (when applicable) are obtained utilizing NASCET criteria, using the distal internal  carotid diameter as the denominator. RADIATION DOSE REDUCTION: This exam was performed according  to the departmental dose-optimization program which includes automated exposure control, adjustment of the mA and/or kV according to patient size and/or use of iterative reconstruction technique. CONTRAST:  176m OMNIPAQUE IOHEXOL 350 MG/ML SOLN COMPARISON:  Prior head CT from earlier the same day. FINDINGS: CTA NECK FINDINGS Aortic arch: Visualized aortic arch normal in caliber with normal branch pattern. Mild-to-moderate atheromatous change about the arch and origin of the great vessels without hemodynamically significant stenosis. Right carotid system: Right CCA patent from its origin to the bifurcation without stenosis. Moderate atheromatous plaque about the right carotid bulb/proximal right ICA without significant stenosis. Right ICA patent distally without stenosis or dissection. Left carotid system: Left CCA patent from its origin to the bifurcation without stenosis. Prominent atheromatous plaque about the left carotid bulb/proximal left ICA with associated stenosis of up to approximately 70-80% by NASCET criteria (series 9, image 210). Exact measurements difficult given motion and streak artifact through this region. Left ICA tortuous but otherwise patent distally to the skull base. Vertebral arteries: Both vertebral arteries arise from the subclavian arteries. No significant proximal subclavian artery stenosis. Atheromatous change about the origins and proximal V1 segments bilaterally with associated moderate up to approximate 50% stenoses. Vertebral arteries otherwise patent distally without stenosis or dissection. Skeleton: No discrete or worrisome osseous lesions. Prior fusion at C5-C6. Other neck: No other acute soft tissue abnormality within the neck. Upper chest: Question possible filling defects involving the left main pulmonary artery, which could reflect acute pulmonary embolus (series 9, image 396).  Finding not entirely certain given timing of the contrast bolus on this exam. Consolidative opacity noted within the visualized left lung similar to previous. Bilateral pleural effusions partially visualized. Degree of underlying pulmonary interstitial edema noted. Review of the MIP images confirms the above findings CTA HEAD FINDINGS Anterior circulation: Examination somewhat limited by timing the contrast bolus. Petrous segments patent bilaterally. Atheromatous change within the carotid siphons with associated mild to moderate multifocal narrowing. A1 segments patent bilaterally. Normal anterior communicating artery complex. Anterior cerebral arteries perfused to their distal aspects without visible stenosis. No M1 stenosis or occlusion. Normal MCA bifurcations. Distal MCA branches perfused and symmetric. Posterior circulation: Both V4 segments patent without stenosis. Left PICA patent. Right PICA not definitely seen. Basilar patent to its distal aspect without stenosis. Superior cerebellar arteries patent bilaterally. Both PCAs primarily supplied via the basilar well perfused or distal aspects. Venous sinuses: Patent allowing for timing the contrast bolus. Anatomic variants: None significant.  No visible aneurysm. Review of the MIP images confirms the above findings IMPRESSION: 1. Negative CTA for large vessel occlusion. 2. 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 3. Atheromatous change about the origins of both vertebral arteries with associated moderate multifocal stenoses. 4. Question filling defects involving the left main pulmonary artery, suspicious for acute pulmonary emboli. Further evaluation with dedicated cross-sectional imaging of the chest recommended. 5. Consolidative opacity within the visualized left lung, similar to prior CT from 10/15/2021. 6. Layering bilateral pleural effusions with underlying  pulmonary interstitial edema, partially visualized. Critical Value/emergent results were called by telephone at the time of interpretation on 11/10/2021 at 3:23 am to the Nurse practitioner taking care of the patient, JQuinn Plowman, who verbally acknowledged these results. Electronically Signed   By: BJeannine BogaM.D.   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  He is negative for any actionable mutations. His PDL1 expression is 30%.   The patient completed palliative radiotherapy to the obstructive lung mass in the left lung under the care of Dr. Tammi Klippel.  Last treatment on 03/23/2021.  He was previously undergoing palliative systemic chemotherapy with carboplatin for an AUC of 5, Alimta 500 mg per metered squared, Keytruda 200 mg IV every 3 weeks.  He is status post 7 cycles.  He had progressive fatigue, weakness, and anemia which was requiring frequent transfusion support.  Therefore, his carboplatin was reduced to an AUC of 4 and his Alimta was reduced to 400 mg per metered square.  Starting from cycle #5, the patient was on maintenance Alimta and Keytruda.  Treatment was on hold from 07/14/2021 to 10/28/2021 due to hospitalizations for pneumonia versus drug-induced pneumonitis due to [facility].Marland Kitchen   He then was seen in the clinic on 10/20/21.  Dr. [name] recommended restarting the patient's treatment with single agent Alimta which was dose reduced to 400 mg per metered squared.  The patient started cycle 8 on 10/28/2021.  Unfortunately, the patient was hospitalized for most of the month of February 2023 for pneumonia, shortness of breath, and  A-fib with RVR.  The patient's hospital reimaging studies show continued disease progression with new multifocal hepatic metastases.  The patient did not have any concerning findings for progression in the chest.   The patient was seen with Dr. [name] today.  Dr. [name] had a lengthy discussion with the patient today about his current condition and recommended treatment options.  The patient is likely not a candidate for further chemotherapy due to his multiple comorbidities. Treatment with Alimta was the easiest tolerated chemotherapy agents, which the patient did not tolerate. Therefore, other systemic chemotherapy options such as docetaxel/cyramza or gemcitabine, would also likely not be well tolerated. Dr. [name] re-discussed that the patient does not have any actionable mutations that we can use an oral targeted agent for. He also discussed given the multiple ill defined liver lesions, radiation is not an option to all these sites of metastatic disease. Dr. [name] strongly recommended that the patient consider palliative care and hospice.   The patient's wife and him are in agreement with this plan.   I will cancel future appointments. However, we are happy to see the patient on an as needed basis. I have placed a referral to palliative care/hospice care with [facility]. Dr. [name] discussed average life-expectancy is 3-6 months.   He recently had his port-a-cath flushed in the hospital. I dicussed with him and his wife that his port should be flushed every 6-8 weeks.   I discussed the assessment and treatment plan with the patient. The patient was provided an opportunity to ask questions and all were answered. The patient agreed with the plan and demonstrated an understanding of the instructions.  The patient was advised to call back or seek an in-person evaluation if the symptoms worsen or if the condition fails to improve as anticipated.  [place] Cantero L Thurman Sarver, PA-C 12/08/2021 1:13  PM  Orders Placed This Encounter  Procedures   Ambulatory referral to Hospice    Referral Priority:   Routine    Referral Type:   Consultation    Referral Reason:   Specialty Services  Required    Requested Specialty:   Hospice Services    Number of Visits Requested:   [place], PA-C 12/08/21  ADDENDUM: Hematology/Oncology Attending: I had a face-to-face encounter with the patient today through [place] video virtual visit.  His wife was available during the visit.  I reviewed his record, lab, scans and recommended his care plan.  This is a very pleasant 76 years old white male with a stage IV (T1b, N2, M1 C) non-small cell lung cancer favoring adenocarcinoma diagnosed in May 2022 with PD-L1 expression of 30% and no actionable mutations. The patient completed palliative radiotherapy to the obstructive lung mass in June 2022. He started systemic chemotherapy with carboplatin, Alimta and Keytruda for 4 cycles followed by maintenance treatment with Alimta and Keytruda for 3 more cycles but the patient was admitted to the hospital with significant shortness of breath and there was a high suspicious of immunotherapy mediated pneumonitis. His treatment was on hold for several months because of his hospitalization and rehabilitation and also deconditioning at that time. The patient was seen few weeks ago and repeat imaging studies showed worsening of his liver metastasis. We started the patient back on treatment with reduced dose Alimta 400 Mg/M2 but unfortunately the patient could not tolerate the treatment and he ended back in the hospital with pneumonia, shortness of breath as well as atrial fibrillation with RVR. He is currently undergoing rehabilitation in a skilled nursing facility. I had a lengthy discussion with the patient and his wife today about his condition and any additional treatment options. I do not think the patient will be a great candidate for any additional  systemic chemotherapy and he also had significant immunotherapy mediated pneumonitis and treatment with immunotherapy will not be a great option. His molecular studies showed no actionable mutations and he is not a candidate for targeted therapy. I strongly recommend for the patient to consider palliative care and hospice at this point. The patient and his wife are in agreement with the current plan.  Unfortunately his prognosis is poor and his expected survival is less than 6 months. I will be happy to see the patient on as-needed basis for any additional needs. He was advised to call if he has any other concerning issues in the interval.  The total time spent in the appointment was 30 minutes. Disclaimer: This note was dictated with voice recognition software. Similar sounding words can inadvertently be transcribed and may be missed upon review. Eilleen Kempf, MD

## 2021-12-07 NOTE — Telephone Encounter (Signed)
Lm for nurse with [facility]. ?

## 2021-12-08 ENCOUNTER — Inpatient Hospital Stay: Payer: Medicare PPO | Attending: Physician Assistant | Admitting: Physician Assistant

## 2021-12-08 ENCOUNTER — Inpatient Hospital Stay: Payer: Medicare PPO

## 2021-12-08 ENCOUNTER — Other Ambulatory Visit: Payer: Self-pay

## 2021-12-08 ENCOUNTER — Other Ambulatory Visit: Payer: Medicare PPO

## 2021-12-08 DIAGNOSIS — C778 Secondary and unspecified malignant neoplasm of lymph nodes of multiple regions: Secondary | ICD-10-CM | POA: Insufficient documentation

## 2021-12-08 DIAGNOSIS — Z79899 Other long term (current) drug therapy: Secondary | ICD-10-CM | POA: Diagnosis not present

## 2021-12-08 DIAGNOSIS — C787 Secondary malignant neoplasm of liver and intrahepatic bile duct: Secondary | ICD-10-CM | POA: Insufficient documentation

## 2021-12-08 DIAGNOSIS — C3412 Malignant neoplasm of upper lobe, left bronchus or lung: Secondary | ICD-10-CM | POA: Insufficient documentation

## 2021-12-08 DIAGNOSIS — C3492 Malignant neoplasm of unspecified part of left bronchus or lung: Secondary | ICD-10-CM

## 2021-12-08 DIAGNOSIS — J910 Malignant pleural effusion: Secondary | ICD-10-CM | POA: Diagnosis not present

## 2021-12-08 DIAGNOSIS — Z7189 Other specified counseling: Secondary | ICD-10-CM

## 2021-12-08 NOTE — Telephone Encounter (Signed)
LMTCB on nurse's station VM  ?

## 2021-12-11 ENCOUNTER — Ambulatory Visit: Payer: Medicare PPO | Admitting: Neurology

## 2021-12-14 ENCOUNTER — Telehealth: Payer: Self-pay

## 2021-12-14 NOTE — Telephone Encounter (Signed)
LVM for patient to send a manual transmission due to missing data from remote transmission. Direct number to device clinic given  ?

## 2021-12-15 ENCOUNTER — Telehealth: Payer: Self-pay

## 2021-12-15 NOTE — Telephone Encounter (Signed)
Patients wife called stating Ryvent is hard getting from the pharmacy at [facility], she wanted to know if Claritin the same as Ryvent and can he take that instead. Patients last visit was June of 2022 but he has been diagnosed with cancer and has been doing chemo and has just now come home from the hospital Friday. I did inform Renee(wife) that they can have a televisit. Would you like to have a televisit with them or can they purchase Claritin over the counter?  ? ?[name] (wife) [phone] ?

## 2021-12-15 NOTE — Telephone Encounter (Signed)
Pts wife, [name], called an advised that pt was discharged from the SNF on Saturday but they hadn't heard anything from Hospice. I advised that hospice likely was awaiting his discharge from the SNF to move forward and I will given them a call to advise of this. Mrs. Counihan was advised that hospice will contact her in 24-48hrs. She expressed understanding of this information. ? ?I have called [facility] and spoke with Margaretmary Eddy, [facility] Clinical Manager and advised that pt is now at home, so please move forward with establishing the pt on hospice. She advised they will contact them to complete enrollment. ?

## 2021-12-15 NOTE — Telephone Encounter (Signed)
Please call patient. ? ?Okay to take Claritin. It is an antihistamine like Ryvent but works a little differently.  ? ?Is he having nasal drainage?  ? ?If Claritin does not help, then schedule televisit. No need to do televisit now as he just came back from the hospital.  ?

## 2021-12-15 NOTE — Telephone Encounter (Signed)
Called and left a voicemail asking for return call to discuss.  ?

## 2021-12-16 MED ORDER — LEVOCETIRIZINE DIHYDROCHLORIDE 5 MG PO TABS: 5.0000 mg | ORAL_TABLET | Freq: Every evening | ORAL | 1 refills | Status: DC

## 2021-12-16 NOTE — Telephone Encounter (Signed)
Sent in [place] 5mg  daily to use at night. ?May not be covered as it is OTC medication now.  ?

## 2021-12-16 NOTE — Addendum Note (Signed)
Addended by: Garnet Sierras on: 12/16/2021 05:18 PM ? ? Modules accepted: Orders ? ?

## 2021-12-16 NOTE — Telephone Encounter (Signed)
Called and left a voicemail asking for a return call to discuss.  ?

## 2021-12-16 NOTE — Telephone Encounter (Signed)
Patient called back and stated that they are having issues getting a full months supply. When they went to pick up their medications they wee only given a ten day supply. Please advise on what else can be sent in that is like Ryvent. Thank you! ?

## 2021-12-17 ENCOUNTER — Telehealth: Payer: Self-pay | Admitting: Cardiovascular Disease

## 2021-12-17 NOTE — Progress Notes (Deleted)
? ?Office Visit  ?  ?Patient Name: Corey [name] ?Date of Encounter: 12/17/2021 ? ?PCP:  [name], MD ?  ?[place]  ?Cardiologist:  Mertie Moores, MD  ?Advanced Practice Provider:  No care team member to display ?Electrophysiologist:  None  ? ?Chief Complaint  ?  ?Corey [name] is a 76 y.o. 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 ? ?He has a history of CVA back in October 2013.  He had some speech problems and loss of fine motor control of his right arm.  TEE was negative for PFO or LAA thrombus.  Event monitor showed no atrial fibrillation at this time.  He had been on Plavix since his stents. ? ?He has been seen by Dr. Acie Fredrickson since 2014.  He was hospitalized in March 2016 for rapid atrial fibrillation.  He was started on IV diltiazem and converted by the next day.  He was seen in the emergency room October 2020 with episodes of palpitations and was found to be in atrial fibrillation.  He was getting prepped for a DCCV but then converted on his own.  December 2020 he had an episode of PAF.  He took a Cardizem but ultimately ended up in the ER.  He received IV diltiazem and then he had a DCCV.  The plan was to start him on Tikosyn however he was on Eligard and there is an interaction.  Message was sent to Dr. Rayann Heman back in April 2022 for consideration of possible ablation. ? ?02/2021 he presented to the ER with increasing shortness of breath.  He had a left pleural effusion with a lung mass questionable for malignancy.  He has since been diagnosed with stage IV non-small cell lung cancer.  He was off anticoagulation at this time preparation for thoracentesis and drain placement.  He was seen in hospital follow-up June 2022.  He was on dofetilide, Xarelto, metoprolol, and as needed diltiazem at that time. ? ?He was  recently seen in the hospital due to chronic respiratory failure with hypoxia.  He had been discharged to rehab but upon arriving there there were issues with his O2 concentrator.  He is chronically on 4 L nasal cannula.  He also redeveloped atrial fibrillation with RVR.  Lung imaging revealed concerning a new right-sided pneumonia and he was started on antibiotics.  He was started on a Cardizem drip for his atrial fibrillation. ? ?Today, he *** ? ?Past Medical History  ?  ?Past Medical History:  ?Diagnosis Date  ? Anemia   ? Anxiety   ? Arthritis   ? "back, right knee, hands, ankles, neck" (05/31/2016)  ? Atrial fibrillation with RVR ([place]) 11/03/2021  ? Carotid artery disease ([place])   ? Chronic lower back pain   ? COPD with acute exacerbation ([place]) 04/02/2021  ? Coronary atherosclerosis of native coronary artery   ? a. BMS to [facility] 2004 and 2007, otherwise mild nonobstructive disease. EF normal.  ? Diverticulitis   ? Dyslipidemia   ? Essential hypertension, benign   ? Family history of breast cancer   ? Family history of lung cancer   ? Family history of prostate cancer   ? GERD (gastroesophageal reflux disease)   ? Hyponatremia 06/04/2016  ? Lumbar radiculopathy, chronic 02/04/2015  ? Right L5  ? Lung cancer ([place])   ? Mild dilation  of ascending aorta ([place]) 08/2021  ? Obesity   ? OSA on CPAP   ? uses cpap  ? Parkinson's disease ([place])   ? Paroxysmal atrial fibrillation ([facility])   ? a. Discovered after stroke.  ? Pleural effusion on left 02/24/2021  ? Pneumonia 01/1996  ? PONV (postoperative nausea and vomiting)   ? Prostate CA [facility])   ? Recurrent upper respiratory infection (URI)   ? Stroke [facility]) 07/2012  ? no deficits  ? Symptomatic anemia   ? Visit for monitoring Tikosyn therapy 09/18/2019  ? ?Past Surgical History:  ?Procedure Laterality Date  ? ADENOIDECTOMY    ? ANTERIOR CERVICAL DECOMP/DISCECTOMY FUSION  07/2001; 10/2002  ? "C5-6; C6-7; redo"  ? BACK SURGERY    ? CARPAL TUNNEL RELEASE Left 10/2015  ? CHEST TUBE INSERTION  Left 03/17/2021  ? Procedure: INSERTION PLEURAL DRAINAGE CATHETER;  Surgeon: Garner Nash, DO;  Location: [facility];  Service: Pulmonary;  Laterality: Left;  Indwelling Tunneled Pleural catheter (PLEUREX)   ? COLONOSCOPY W/ POLYPECTOMY  02/2014  ? CORONARY ANGIOPLASTY WITH STENT PLACEMENT  05/2003; 12/2005  ? "mid RCA; mid RCA"  ? FINE NEEDLE ASPIRATION  02/26/2021  ? Procedure: FINE NEEDLE ASPIRATION (FNA) LINEAR;  Surgeon: Candee Furbish, MD;  Location: [facility] ENDOSCOPY;  Service: Pulmonary;;  ? HAND SURGERY  02/2019  ? LEFT HAND  ? implantable loop recorder placement  02/27/2020  ? Medtronic Reveal [place] model [id number] S  implantable loop recorder implanted by Dr Rayann Heman for afib management and evaluation of presyncope  ? IR IMAGING GUIDED PORT INSERTION  04/16/2021  ? JOINT REPLACEMENT    ? KNEE ARTHROSCOPY Left 10/2005  ? KNEE ARTHROSCOPY W/ PARTIAL MEDIAL MENISCECTOMY Left 09/2005  ? LUMBAR LAMINECTOMY/DECOMPRESSION MICRODISCECTOMY  03/2005  ? "L4-5"  ? POSTERIOR LUMBAR FUSION  10/2003  ? L5-S1; "plates, screws"  ? SHOULDER ARTHROSCOPY Right 08/2011  ? Debridement of labrum, arthroscopic distal clavicle excision  ? SHOULDER OPEN ROTATOR CUFF REPAIR Left 07/2014  ? TEE WITHOUT CARDIOVERSION  07/07/2012  ? Procedure: TRANSESOPHAGEAL ECHOCARDIOGRAM (TEE);  Surgeon: Fay Records, MD;  Location: [facility];  Service: Cardiovascular;  Laterality: N/A;  ? THORACENTESIS N/A 02/25/2021  ? Procedure: THORACENTESIS;  Surgeon: Juanito Doom, MD;  Location: [facility];  Service: Cardiopulmonary;  Laterality: N/A;  ? TONSILLECTOMY AND ADENOIDECTOMY  ~ 1956  ? TOTAL KNEE ARTHROPLASTY Left 10/2006  ? TRIGGER FINGER RELEASE Left 10/2015  ? VIDEO BRONCHOSCOPY WITH ENDOBRONCHIAL ULTRASOUND Left 02/26/2021  ? Procedure: VIDEO BRONCHOSCOPY WITH ENDOBRONCHIAL ULTRASOUND;  Surgeon: Candee Furbish, MD;  Location: [facility];  Service: Pulmonary;  Laterality: Left;  cryoprobe too thanks!  ? ? ?Allergies ? ?Allergies  ?Allergen  Reactions  ? Amoxicillin Anaphylaxis and Swelling  ?  Tolerates Rocephin  ? Fosinopril Other (See Comments) and Cough  ?  Muscle aches ?(Monopril)  ? Hydromorphone Nausea And Vomiting  ? Ampicillin Rash  ? Codeine Nausea Only and Other (See Comments)  ?  Heavy amounts cause nausea and stomach upset  ? Rosuvastatin Other (See Comments)  ?  Joint pain  ? ? ?History of Present Illness  ?  ?Corey [name] is a 76 y.o. male with a hx of *** last seen ***. ? ? ?EKGs/Labs/Other Studies Reviewed:  ? ?The following studies were reviewed today: ?*** ? ?EKG:  EKG is *** ordered today.  The ekg ordered today demonstrates *** ? ?Recent Labs: ?11/21/2021: B Natriuretic Peptide 161.0 ?11/23/2021: ALT 10; TSH 1.286 ?11/26/2021: BUN  28; Creatinine, Ser 0.92; Hemoglobin 9.1; Magnesium 1.9; Platelets 274; Potassium 4.4; Sodium 135  ?Recent Lipid Panel ?   ?Component Value Date/Time  ? CHOL 111 01/21/2021 1553  ? CHOL 108 01/05/2013 0748  ? TRIG 88 01/21/2021 1553  ? TRIG 84 01/05/2013 0748  ? HDL 39 (L) 01/21/2021 1553  ? HDL 29 (L) 01/05/2013 0748  ? CHOLHDL 2.8 01/21/2021 1553  ? CHOLHDL 3.7 08/30/2016 0840  ? VLDL 21 08/30/2016 0840  ? LDLCALC 55 01/21/2021 1553  ? [place] 62 01/05/2013 0748  ? ? ?Risk Assessment/Calculations:  ?{Does this patient have ATRIAL FIBRILLATION?[phone]} ? ?Home Medications  ? ?No outpatient medications have been marked as taking for the 12/18/21 encounter (Appointment) with Elgie Collard, PA-C.  ?  ? ?Review of Systems  ? ***   ?All other systems reviewed and are otherwise negative except as noted above. ? ?Physical Exam  ?  ?VS:  There were no vitals taken for this visit. , BMI There is no height or weight on file to calculate BMI. ? ?Wt Readings from Last 3 Encounters:  ?11/21/21 231 lb 4.2 oz (104.9 kg)  ?11/15/21 231 lb 4.2 oz (104.9 kg)  ?10/28/21 228 lb 8 oz (103.6 kg)  ?  ? ?GEN: Well nourished, well developed, in no acute distress. ?HEENT: normal. ?Neck: Supple, no JVD, carotid bruits, or  masses. ?Cardiac: ***RRR, no murmurs, rubs, or gallops. No clubbing, cyanosis, edema.  ***Radials/PT 2+ and equal bilaterally.  ?Respiratory:  ***Respirations regular and unlabored, clear to auscultation bilat

## 2021-12-17 NOTE — Telephone Encounter (Signed)
Called patient's wife back. Patient was started on amiodarone in the hospital and now he is about out of the medication and wants to know if he needs to continue it. Patient was scheduled for follow up but canceled all his appointment, due to not being able to get in the car and come. Patient's wife stated they are meeting with Hospice tomorrow. Will forward to Dr. Acie Fredrickson and A. Fib clinic for advisement. ?

## 2021-12-17 NOTE — Telephone Encounter (Signed)
I called the patient and informed then Dr. Maudie Mercury sent in [place] for him to take at night since the Claritin has not been effective and there has been issues getting the ryvent. She will speak to her husband and call back to schedule a tele-visit.  ?

## 2021-12-17 NOTE — Telephone Encounter (Signed)
Per Adline Peals PA will continue amiodarone for continued symptom control.  ?

## 2021-12-17 NOTE — Telephone Encounter (Signed)
Pt c/o medication issue: ? ?1. Name of Medication: amiodarone (PACERONE) 200 MG tablet  ? ?2. How are you currently taking this medication (dosage and times per day)? Take 1 tablet (200 mg total) by mouth daily. ? ?3. Are you having a reaction (difficulty breathing--STAT)?  ? ?4. What is your medication issue? Wife called stating when he was in the hospital they put him on this medication.  On the discharge instructions it stated to continue taking for 6 more days.  However when he left rehab they gave him 7 pills.  She wants to know if this is something he is to continue taking.  If so he will need a refill on it.    ?

## 2021-12-17 NOTE — Telephone Encounter (Signed)
Left message for patient's wife to call back.  

## 2021-12-18 ENCOUNTER — Telehealth: Payer: Self-pay

## 2021-12-18 ENCOUNTER — Ambulatory Visit: Payer: Medicare PPO | Admitting: Physician Assistant

## 2021-12-18 ENCOUNTER — Other Ambulatory Visit: Payer: Self-pay | Admitting: Physician Assistant

## 2021-12-18 DIAGNOSIS — J189 Pneumonia, unspecified organism: Secondary | ICD-10-CM

## 2021-12-18 MED ORDER — CEFDINIR 300 MG PO CAPS: 300.0000 mg | ORAL_CAPSULE | Freq: Two times a day (BID) | ORAL | 0 refills | Status: DC

## 2021-12-18 MED ORDER — DOXYCYCLINE HYCLATE 100 MG PO TABS: 100.0000 mg | ORAL_TABLET | Freq: Two times a day (BID) | ORAL | 0 refills | Status: DC

## 2021-12-18 NOTE — Telephone Encounter (Signed)
Call received from [place] with [facility] stating pts wife is concerned he is developing pneumonia again and would like antibiotics given because he gets PNA frequently. ? ?Discussed with Cassandra PA-C who also reviewed this request with the Pharmacy. We have sent Doxycycline and Omnicef to pts confirmed pharmacy on file.  ? ?I have called [facility] again and advised of this. She expressed understanding of this information. ?

## 2021-12-20 ENCOUNTER — Other Ambulatory Visit: Payer: Self-pay

## 2021-12-20 ENCOUNTER — Emergency Department (HOSPITAL_COMMUNITY)

## 2021-12-20 ENCOUNTER — Encounter (HOSPITAL_COMMUNITY): Payer: Self-pay | Admitting: Internal Medicine

## 2021-12-20 ENCOUNTER — Inpatient Hospital Stay (HOSPITAL_COMMUNITY)

## 2021-12-20 ENCOUNTER — Inpatient Hospital Stay (HOSPITAL_COMMUNITY)
Admission: EM | Admit: 2021-12-20 | Discharge: 2021-12-26 | DRG: 189 | Disposition: A | Discharge status: Hospice | Attending: Internal Medicine | Admitting: Internal Medicine

## 2021-12-20 DIAGNOSIS — C787 Secondary malignant neoplasm of liver and intrahepatic bile duct: Secondary | ICD-10-CM | POA: Diagnosis present

## 2021-12-20 DIAGNOSIS — Z20822 Contact with and (suspected) exposure to covid-19: Secondary | ICD-10-CM | POA: Diagnosis present

## 2021-12-20 DIAGNOSIS — Z7901 Long term (current) use of anticoagulants: Secondary | ICD-10-CM

## 2021-12-20 DIAGNOSIS — Z8249 Family history of ischemic heart disease and other diseases of the circulatory system: Secondary | ICD-10-CM

## 2021-12-20 DIAGNOSIS — Z885 Allergy status to narcotic agent status: Secondary | ICD-10-CM

## 2021-12-20 DIAGNOSIS — Z515 Encounter for palliative care: Secondary | ICD-10-CM

## 2021-12-20 DIAGNOSIS — Z923 Personal history of irradiation: Secondary | ICD-10-CM

## 2021-12-20 DIAGNOSIS — Z87891 Personal history of nicotine dependence: Secondary | ICD-10-CM

## 2021-12-20 DIAGNOSIS — R918 Other nonspecific abnormal finding of lung field: Secondary | ICD-10-CM | POA: Diagnosis not present

## 2021-12-20 DIAGNOSIS — Z955 Presence of coronary angioplasty implant and graft: Secondary | ICD-10-CM

## 2021-12-20 DIAGNOSIS — C61 Malignant neoplasm of prostate: Secondary | ICD-10-CM | POA: Diagnosis present

## 2021-12-20 DIAGNOSIS — G4733 Obstructive sleep apnea (adult) (pediatric): Secondary | ICD-10-CM

## 2021-12-20 DIAGNOSIS — J449 Chronic obstructive pulmonary disease, unspecified: Secondary | ICD-10-CM | POA: Diagnosis not present

## 2021-12-20 DIAGNOSIS — G20 Parkinson's disease: Secondary | ICD-10-CM | POA: Diagnosis present

## 2021-12-20 DIAGNOSIS — Z801 Family history of malignant neoplasm of trachea, bronchus and lung: Secondary | ICD-10-CM

## 2021-12-20 DIAGNOSIS — I10 Essential (primary) hypertension: Secondary | ICD-10-CM | POA: Diagnosis not present

## 2021-12-20 DIAGNOSIS — M7989 Other specified soft tissue disorders: Secondary | ICD-10-CM

## 2021-12-20 DIAGNOSIS — R0602 Shortness of breath: Principal | ICD-10-CM

## 2021-12-20 DIAGNOSIS — Z880 Allergy status to penicillin: Secondary | ICD-10-CM

## 2021-12-20 DIAGNOSIS — M19072 Primary osteoarthritis, left ankle and foot: Secondary | ICD-10-CM | POA: Diagnosis present

## 2021-12-20 DIAGNOSIS — G8929 Other chronic pain: Secondary | ICD-10-CM | POA: Diagnosis present

## 2021-12-20 DIAGNOSIS — M19071 Primary osteoarthritis, right ankle and foot: Secondary | ICD-10-CM | POA: Diagnosis present

## 2021-12-20 DIAGNOSIS — D539 Nutritional anemia, unspecified: Secondary | ICD-10-CM | POA: Diagnosis present

## 2021-12-20 DIAGNOSIS — J9811 Atelectasis: Secondary | ICD-10-CM | POA: Diagnosis present

## 2021-12-20 DIAGNOSIS — I480 Paroxysmal atrial fibrillation: Secondary | ICD-10-CM | POA: Diagnosis not present

## 2021-12-20 DIAGNOSIS — J189 Pneumonia, unspecified organism: Secondary | ICD-10-CM | POA: Diagnosis not present

## 2021-12-20 DIAGNOSIS — K219 Gastro-esophageal reflux disease without esophagitis: Secondary | ICD-10-CM | POA: Diagnosis present

## 2021-12-20 DIAGNOSIS — E785 Hyperlipidemia, unspecified: Secondary | ICD-10-CM | POA: Diagnosis present

## 2021-12-20 DIAGNOSIS — J9621 Acute and chronic respiratory failure with hypoxia: Secondary | ICD-10-CM | POA: Diagnosis not present

## 2021-12-20 DIAGNOSIS — I4892 Unspecified atrial flutter: Secondary | ICD-10-CM | POA: Diagnosis present

## 2021-12-20 DIAGNOSIS — Z888 Allergy status to other drugs, medicaments and biological substances status: Secondary | ICD-10-CM

## 2021-12-20 DIAGNOSIS — F419 Anxiety disorder, unspecified: Secondary | ICD-10-CM | POA: Diagnosis present

## 2021-12-20 DIAGNOSIS — J90 Pleural effusion, not elsewhere classified: Secondary | ICD-10-CM | POA: Diagnosis not present

## 2021-12-20 DIAGNOSIS — J440 Chronic obstructive pulmonary disease with acute lower respiratory infection: Secondary | ICD-10-CM | POA: Diagnosis present

## 2021-12-20 DIAGNOSIS — Z8701 Personal history of pneumonia (recurrent): Secondary | ICD-10-CM

## 2021-12-20 DIAGNOSIS — C3490 Malignant neoplasm of unspecified part of unspecified bronchus or lung: Secondary | ICD-10-CM | POA: Diagnosis not present

## 2021-12-20 DIAGNOSIS — K5900 Constipation, unspecified: Secondary | ICD-10-CM | POA: Diagnosis present

## 2021-12-20 DIAGNOSIS — Y95 Nosocomial condition: Secondary | ICD-10-CM | POA: Diagnosis present

## 2021-12-20 DIAGNOSIS — Z9221 Personal history of antineoplastic chemotherapy: Secondary | ICD-10-CM

## 2021-12-20 DIAGNOSIS — R0902 Hypoxemia: Secondary | ICD-10-CM | POA: Diagnosis not present

## 2021-12-20 DIAGNOSIS — Z7951 Long term (current) use of inhaled steroids: Secondary | ICD-10-CM

## 2021-12-20 DIAGNOSIS — I959 Hypotension, unspecified: Secondary | ICD-10-CM | POA: Diagnosis not present

## 2021-12-20 DIAGNOSIS — R609 Edema, unspecified: Secondary | ICD-10-CM

## 2021-12-20 DIAGNOSIS — Z79899 Other long term (current) drug therapy: Secondary | ICD-10-CM

## 2021-12-20 DIAGNOSIS — R059 Cough, unspecified: Secondary | ICD-10-CM | POA: Diagnosis not present

## 2021-12-20 DIAGNOSIS — Z85118 Personal history of other malignant neoplasm of bronchus and lung: Secondary | ICD-10-CM | POA: Diagnosis not present

## 2021-12-20 DIAGNOSIS — Z96652 Presence of left artificial knee joint: Secondary | ICD-10-CM | POA: Diagnosis present

## 2021-12-20 DIAGNOSIS — M1909 Primary osteoarthritis, other specified site: Secondary | ICD-10-CM | POA: Diagnosis present

## 2021-12-20 DIAGNOSIS — M19042 Primary osteoarthritis, left hand: Secondary | ICD-10-CM | POA: Diagnosis present

## 2021-12-20 DIAGNOSIS — I2510 Atherosclerotic heart disease of native coronary artery without angina pectoris: Secondary | ICD-10-CM | POA: Diagnosis present

## 2021-12-20 DIAGNOSIS — Z8042 Family history of malignant neoplasm of prostate: Secondary | ICD-10-CM

## 2021-12-20 DIAGNOSIS — Z8673 Personal history of transient ischemic attack (TIA), and cerebral infarction without residual deficits: Secondary | ICD-10-CM

## 2021-12-20 DIAGNOSIS — R531 Weakness: Secondary | ICD-10-CM | POA: Diagnosis not present

## 2021-12-20 DIAGNOSIS — C3492 Malignant neoplasm of unspecified part of left bronchus or lung: Secondary | ICD-10-CM | POA: Diagnosis present

## 2021-12-20 DIAGNOSIS — Z66 Do not resuscitate: Secondary | ICD-10-CM | POA: Diagnosis not present

## 2021-12-20 DIAGNOSIS — I4891 Unspecified atrial fibrillation: Secondary | ICD-10-CM | POA: Diagnosis not present

## 2021-12-20 DIAGNOSIS — E871 Hypo-osmolality and hyponatremia: Secondary | ICD-10-CM | POA: Diagnosis not present

## 2021-12-20 DIAGNOSIS — M479 Spondylosis, unspecified: Secondary | ICD-10-CM | POA: Diagnosis present

## 2021-12-20 DIAGNOSIS — M545 Low back pain, unspecified: Secondary | ICD-10-CM | POA: Diagnosis present

## 2021-12-20 DIAGNOSIS — M19041 Primary osteoarthritis, right hand: Secondary | ICD-10-CM | POA: Diagnosis present

## 2021-12-20 DIAGNOSIS — Z9989 Dependence on other enabling machines and devices: Secondary | ICD-10-CM | POA: Diagnosis not present

## 2021-12-20 DIAGNOSIS — M1711 Unilateral primary osteoarthritis, right knee: Secondary | ICD-10-CM | POA: Diagnosis present

## 2021-12-20 DIAGNOSIS — Z7189 Other specified counseling: Secondary | ICD-10-CM

## 2021-12-20 DIAGNOSIS — R000 Tachycardia, unspecified: Secondary | ICD-10-CM | POA: Diagnosis not present

## 2021-12-20 LAB — BLOOD GAS, ARTERIAL
Acid-base deficit: 2.7 mmol/L — ABNORMAL HIGH (ref 0.0–2.0)
Bicarbonate: 24 mmol/L (ref 20.0–28.0)
O2 Saturation: 97.6 %
Patient temperature: 37
pCO2 arterial: 50 mmHg — ABNORMAL HIGH (ref 32–48)
pH, Arterial: 7.29 — ABNORMAL LOW (ref 7.35–7.45)
pO2, Arterial: 98 mmHg (ref 83–108)

## 2021-12-20 LAB — COMPREHENSIVE METABOLIC PANEL
ALT: 8 U/L (ref 0–44)
AST: 17 U/L (ref 15–41)
Albumin: 2.7 g/dL — ABNORMAL LOW (ref 3.5–5.0)
Alkaline Phosphatase: 45 U/L (ref 38–126)
Anion gap: 11 (ref 5–15)
BUN: 12 mg/dL (ref 8–23)
CO2: 23 mmol/L (ref 22–32)
Calcium: 8.4 mg/dL — ABNORMAL LOW (ref 8.9–10.3)
Chloride: 94 mmol/L — ABNORMAL LOW (ref 98–111)
Creatinine, Ser: 0.77 mg/dL (ref 0.61–1.24)
GFR, Estimated: >60 mL/min (ref >60)
Glucose, Bld: 106 mg/dL — ABNORMAL HIGH (ref 70–99)
Potassium: 3.7 mmol/L (ref 3.5–5.1)
Sodium: 128 mmol/L — ABNORMAL LOW (ref 135–145)
Total Bilirubin: 0.7 mg/dL (ref 0.3–1.2)
Total Protein: 6.7 g/dL (ref 6.5–8.1)

## 2021-12-20 LAB — RESPIRATORY PANEL BY PCR

## 2021-12-20 LAB — RESP PANEL BY RT-PCR (FLU A&B, COVID) ARPGX2
Influenza A by PCR: NEGATIVE
Influenza B by PCR: NEGATIVE
SARS Coronavirus 2 by RT PCR: NEGATIVE

## 2021-12-20 LAB — CBC WITH DIFFERENTIAL/PLATELET
Abs Immature Granulocytes: 0.39 10*3/uL — ABNORMAL HIGH (ref 0.00–0.07)
Basophils Absolute: 0.1 10*3/uL (ref 0.0–0.1)
Basophils Relative: 1 %
Eosinophils Absolute: 0.2 10*3/uL (ref 0.0–0.5)
Eosinophils Relative: 2 %
HCT: 29.5 % — ABNORMAL LOW (ref 39.0–52.0)
Hemoglobin: 9.3 g/dL — ABNORMAL LOW (ref 13.0–17.0)
Immature Granulocytes: 5 %
Lymphocytes Relative: 5 %
Lymphs Abs: 0.4 10*3/uL — ABNORMAL LOW (ref 0.7–4.0)
MCH: 32.6 pg (ref 26.0–34.0)
MCHC: 31.5 g/dL (ref 30.0–36.0)
MCV: 103.5 fL — ABNORMAL HIGH (ref 80.0–100.0)
Monocytes Absolute: 0.8 10*3/uL (ref 0.1–1.0)
Monocytes Relative: 11 %
Neutro Abs: 6 10*3/uL (ref 1.7–7.7)
Neutrophils Relative %: 76 %
Platelets: 321 10*3/uL (ref 150–400)
RBC: 2.85 MIL/uL — ABNORMAL LOW (ref 4.22–5.81)
RDW: 17.6 % — ABNORMAL HIGH (ref 11.5–15.5)
WBC: 7.9 10*3/uL (ref 4.0–10.5)
nRBC: 0 % (ref 0.0–0.2)

## 2021-12-20 LAB — LACTIC ACID, PLASMA
Lactic Acid, Venous: 1 mmol/L (ref 0.5–1.9)
Lactic Acid, Venous: 1 mmol/L (ref 0.5–1.9)

## 2021-12-20 LAB — PROTIME-INR
INR: 1.9 — ABNORMAL HIGH (ref 0.8–1.2)
Prothrombin Time: 22 seconds — ABNORMAL HIGH (ref 11.4–15.2)

## 2021-12-20 LAB — URINALYSIS, ROUTINE W REFLEX MICROSCOPIC
Bilirubin Urine: NEGATIVE
Glucose, UA: NEGATIVE mg/dL
Hgb urine dipstick: NEGATIVE
Ketones, ur: NEGATIVE mg/dL
Leukocytes,Ua: NEGATIVE
Nitrite: NEGATIVE
Protein, ur: NEGATIVE mg/dL
Specific Gravity, Urine: 1.02 (ref 1.005–1.030)
pH: 6 (ref 5.0–8.0)

## 2021-12-20 LAB — MRSA NEXT GEN BY PCR, NASAL: MRSA by PCR Next Gen: NOT DETECTED

## 2021-12-20 LAB — APTT: aPTT: 62 seconds — ABNORMAL HIGH (ref 24–36)

## 2021-12-20 MED ORDER — SODIUM CHLORIDE 0.9 % IV SOLN
2.0000 g | Freq: Every day | INTRAVENOUS | Status: AC
  Administered 2021-12-21 – 2021-12-24 (×4): 2 g via INTRAVENOUS
  Filled 2021-12-20 (×4): qty 20

## 2021-12-20 MED ORDER — TOPIRAMATE 25 MG PO TABS: 50.0000 mg | ORAL_TABLET | ORAL | Status: DC

## 2021-12-20 MED ORDER — LEVALBUTEROL HCL 0.63 MG/3ML IN NEBU
0.6300 mg | INHALATION_SOLUTION | Freq: Two times a day (BID) | RESPIRATORY_TRACT | Status: DC
Start: 2021-12-20 — End: 2021-12-26
  Administered 2021-12-20 – 2021-12-26 (×13): 0.63 mg via RESPIRATORY_TRACT
  Filled 2021-12-20 (×13): qty 3

## 2021-12-20 MED ORDER — SODIUM CHLORIDE 0.9 % IV SOLN: 2.0000 g | INTRAVENOUS | Status: DC

## 2021-12-20 MED ORDER — CHLORHEXIDINE GLUCONATE 0.12 % MT SOLN
15.0000 mL | Freq: Two times a day (BID) | OROMUCOSAL | Status: DC
  Administered 2021-12-20 – 2021-12-22 (×4): 15 mL via OROMUCOSAL
  Filled 2021-12-20 (×4): qty 15

## 2021-12-20 MED ORDER — FENOFIBRATE 160 MG PO TABS
160.0000 mg | ORAL_TABLET | Freq: Every day | ORAL | Status: DC
  Administered 2021-12-21 – 2021-12-25 (×5): 160 mg via ORAL
  Filled 2021-12-20 (×7): qty 1

## 2021-12-20 MED ORDER — POLYETHYLENE GLYCOL 3350 17 G PO PACK
17.0000 g | PACK | Freq: Every day | ORAL | Status: DC
  Administered 2021-12-21: 17 g via ORAL
  Filled 2021-12-20: qty 1

## 2021-12-20 MED ORDER — BUDESONIDE 0.5 MG/2ML IN SUSP
0.5000 mg | Freq: Two times a day (BID) | RESPIRATORY_TRACT | Status: DC
  Administered 2021-12-20 – 2021-12-26 (×13): 0.5 mg via RESPIRATORY_TRACT
  Filled 2021-12-20 (×13): qty 2

## 2021-12-20 MED ORDER — ONDANSETRON HCL 4 MG/2ML IJ SOLN: 4.0000 mg | Freq: Once | INTRAMUSCULAR | Status: AC

## 2021-12-20 MED ORDER — CARBIDOPA-LEVODOPA 25-100 MG PO TABS
1.0000 | ORAL_TABLET | ORAL | Status: DC
  Administered 2021-12-21 – 2021-12-26 (×21): 1 via ORAL
  Filled 2021-12-20 (×25): qty 1

## 2021-12-20 MED ORDER — POTASSIUM CHLORIDE CRYS ER 10 MEQ PO TBCR
10.0000 meq | EXTENDED_RELEASE_TABLET | Freq: Every day | ORAL | Status: DC
  Administered 2021-12-20 – 2021-12-25 (×6): 10 meq via ORAL
  Filled 2021-12-20 (×6): qty 1

## 2021-12-20 MED ORDER — LEVOFLOXACIN IN D5W 750 MG/150ML IV SOLN: 750.0000 mg | Freq: Once | INTRAVENOUS | Status: DC

## 2021-12-20 MED ORDER — MAGNESIUM OXIDE -MG SUPPLEMENT 400 (240 MG) MG PO TABS
400.0000 mg | ORAL_TABLET | Freq: Every day | ORAL | Status: DC
  Administered 2021-12-21 – 2021-12-25 (×5): 400 mg via ORAL
  Filled 2021-12-20 (×6): qty 1

## 2021-12-20 MED ORDER — SODIUM CHLORIDE 0.9 % IV SOLN: 2.0000 g | Freq: Every day | INTRAVENOUS | Status: DC

## 2021-12-20 MED ORDER — DILTIAZEM HCL ER COATED BEADS 120 MG PO CP24: 360.0000 mg | ORAL_CAPSULE | Freq: Every day | ORAL | Status: DC

## 2021-12-20 MED ORDER — DIGOXIN 0.25 MG/ML IJ SOLN
0.2500 mg | Freq: Once | INTRAMUSCULAR | Status: AC
Start: 2021-12-20 — End: 2021-12-20
  Administered 2021-12-20: 0.25 mg via INTRAVENOUS
  Filled 2021-12-20: qty 2

## 2021-12-20 MED ORDER — VANCOMYCIN HCL 2000 MG/400ML IV SOLN
2000.0000 mg | Freq: Once | INTRAVENOUS | Status: AC
  Administered 2021-12-20: 2000 mg via INTRAVENOUS
  Filled 2021-12-20: qty 400

## 2021-12-20 MED ORDER — ADULT MULTIVITAMIN W/MINERALS CH
1.0000 | ORAL_TABLET | Freq: Every day | ORAL | Status: DC
  Administered 2021-12-21 – 2021-12-25 (×5): 1 via ORAL
  Filled 2021-12-20 (×5): qty 1

## 2021-12-20 MED ORDER — SODIUM CHLORIDE 0.9 % IV SOLN: INTRAVENOUS | Status: DC | PRN

## 2021-12-20 MED ORDER — METOPROLOL SUCCINATE ER 25 MG PO TB24
25.0000 mg | ORAL_TABLET | Freq: Every day | ORAL | Status: DC
  Administered 2021-12-21 – 2021-12-26 (×6): 25 mg via ORAL
  Filled 2021-12-20 (×6): qty 1

## 2021-12-20 MED ORDER — SODIUM CHLORIDE 0.9 % IV SOLN
100.0000 mg | Freq: Two times a day (BID) | INTRAVENOUS | Status: DC
  Administered 2021-12-21: 100 mg via INTRAVENOUS
  Filled 2021-12-20 (×2): qty 100

## 2021-12-20 MED ORDER — FOLIC ACID 1 MG PO TABS
1.0000 mg | ORAL_TABLET | Freq: Every day | ORAL | Status: DC
  Administered 2021-12-20 – 2021-12-26 (×7): 1 mg via ORAL
  Filled 2021-12-20 (×7): qty 1

## 2021-12-20 MED ORDER — TOPIRAMATE 25 MG PO TABS
50.0000 mg | ORAL_TABLET | Freq: Every day | ORAL | Status: DC
  Administered 2021-12-21 – 2021-12-26 (×6): 50 mg via ORAL
  Filled 2021-12-20 (×6): qty 2

## 2021-12-20 MED ORDER — VANCOMYCIN HCL IN DEXTROSE 1-5 GM/200ML-% IV SOLN: 1000.0000 mg | Freq: Once | INTRAVENOUS | Status: DC

## 2021-12-20 MED ORDER — TOPIRAMATE 100 MG PO TABS
100.0000 mg | ORAL_TABLET | Freq: Every day | ORAL | Status: DC
  Administered 2021-12-21 – 2021-12-25 (×5): 100 mg via ORAL
  Filled 2021-12-20 (×6): qty 1

## 2021-12-20 MED ORDER — FLUTICASONE PROPIONATE 50 MCG/ACT NA SUSP
2.0000 | Freq: Every day | NASAL | Status: DC
  Administered 2021-12-21 – 2021-12-26 (×6): 2 via NASAL
  Filled 2021-12-20: qty 16

## 2021-12-20 MED ORDER — RIVAROXABAN 20 MG PO TABS
20.0000 mg | ORAL_TABLET | Freq: Every day | ORAL | Status: DC
  Administered 2021-12-20 – 2021-12-25 (×6): 20 mg via ORAL
  Filled 2021-12-20 (×6): qty 1

## 2021-12-20 MED ORDER — IPRATROPIUM BROMIDE 0.03 % NA SOLN
2.0000 | Freq: Every day | NASAL | Status: DC
  Administered 2021-12-21 – 2021-12-26 (×6): 2 via NASAL
  Filled 2021-12-20: qty 30

## 2021-12-20 MED ORDER — SALINE SPRAY 0.65 % NA SOLN: 1.0000 | NASAL | Status: DC | PRN

## 2021-12-20 MED ORDER — SODIUM CHLORIDE 0.9 % IV SOLN
1.0000 g | Freq: Once | INTRAVENOUS | Status: AC
  Administered 2021-12-20: 1 g via INTRAVENOUS
  Filled 2021-12-20: qty 10

## 2021-12-20 MED ORDER — LACTATED RINGERS IV BOLUS
1000.0000 mL | Freq: Once | INTRAVENOUS | Status: AC
  Administered 2021-12-20: 1000 mL via INTRAVENOUS

## 2021-12-20 MED ORDER — DILTIAZEM LOAD VIA INFUSION
10.0000 mg | Freq: Once | INTRAVENOUS | Status: AC
  Administered 2021-12-20: 10 mg via INTRAVENOUS
  Filled 2021-12-20: qty 10

## 2021-12-20 MED ORDER — SODIUM CHLORIDE 0.9 % IV SOLN
500.0000 mg | Freq: Once | INTRAVENOUS | Status: AC
  Administered 2021-12-20: 500 mg via INTRAVENOUS
  Filled 2021-12-20: qty 5

## 2021-12-20 MED ORDER — SENNOSIDES-DOCUSATE SODIUM 8.6-50 MG PO TABS
1.0000 | ORAL_TABLET | Freq: Two times a day (BID) | ORAL | Status: DC
  Administered 2021-12-21 – 2021-12-26 (×10): 1 via ORAL
  Filled 2021-12-20 (×11): qty 1

## 2021-12-20 MED ORDER — OXYCODONE HCL 5 MG PO TABS
5.0000 mg | ORAL_TABLET | ORAL | Status: DC | PRN
  Administered 2021-12-25 – 2021-12-26 (×2): 5 mg via ORAL
  Filled 2021-12-20 (×2): qty 1

## 2021-12-20 MED ORDER — CHLORHEXIDINE GLUCONATE CLOTH 2 % EX PADS
6.0000 | MEDICATED_PAD | Freq: Every day | CUTANEOUS | Status: DC
  Administered 2021-12-20 – 2021-12-25 (×6): 6 via TOPICAL

## 2021-12-20 MED ORDER — SODIUM CHLORIDE 0.9 % IV SOLN: 100.0000 mg | Freq: Two times a day (BID) | INTRAVENOUS | Status: DC

## 2021-12-20 MED ORDER — LORATADINE 10 MG PO TABS
10.0000 mg | ORAL_TABLET | Freq: Every evening | ORAL | Status: DC
  Administered 2021-12-21 – 2021-12-25 (×5): 10 mg via ORAL
  Filled 2021-12-20 (×4): qty 1

## 2021-12-20 MED ORDER — ONDANSETRON HCL 4 MG/2ML IJ SOLN
INTRAMUSCULAR | Status: AC
  Administered 2021-12-20: 4 mg via INTRAVENOUS
  Filled 2021-12-20: qty 2

## 2021-12-20 MED ORDER — ACETAMINOPHEN 650 MG RE SUPP: 650.0000 mg | Freq: Four times a day (QID) | RECTAL | Status: DC | PRN

## 2021-12-20 MED ORDER — AMIODARONE HCL 200 MG PO TABS
200.0000 mg | ORAL_TABLET | Freq: Every day | ORAL | Status: DC
  Filled 2021-12-20: qty 1

## 2021-12-20 MED ORDER — GUAIFENESIN ER 600 MG PO TB12
1200.0000 mg | ORAL_TABLET | Freq: Two times a day (BID) | ORAL | Status: DC
Start: 2021-12-20 — End: 2021-12-26
  Administered 2021-12-21 – 2021-12-26 (×11): 1200 mg via ORAL
  Filled 2021-12-20 (×12): qty 2

## 2021-12-20 MED ORDER — ACETAMINOPHEN 325 MG PO TABS: 650.0000 mg | ORAL_TABLET | Freq: Four times a day (QID) | ORAL | Status: DC | PRN

## 2021-12-20 MED ORDER — ALPRAZOLAM 0.5 MG PO TABS
0.5000 mg | ORAL_TABLET | Freq: Every evening | ORAL | Status: DC | PRN
  Administered 2021-12-21 – 2021-12-24 (×4): 0.5 mg via ORAL
  Filled 2021-12-20 (×4): qty 1

## 2021-12-20 MED ORDER — DILTIAZEM HCL-DEXTROSE 125-5 MG/125ML-% IV SOLN (PREMIX)
5.0000 mg/h | INTRAVENOUS | Status: DC
  Administered 2021-12-20: 15 mg/h via INTRAVENOUS
  Administered 2021-12-20: 5 mg/h via INTRAVENOUS
  Administered 2021-12-21 – 2021-12-24 (×10): 15 mg/h via INTRAVENOUS
  Administered 2021-12-24: 10 mg/h via INTRAVENOUS
  Filled 2021-12-20 (×16): qty 125

## 2021-12-20 MED ORDER — TAMSULOSIN HCL 0.4 MG PO CAPS
0.4000 mg | ORAL_CAPSULE | Freq: Every day | ORAL | Status: DC
  Administered 2021-12-20 – 2021-12-25 (×6): 0.4 mg via ORAL
  Filled 2021-12-20 (×6): qty 1

## 2021-12-20 MED ORDER — ACETYLCYSTEINE 20 % IN SOLN
2.0000 mL | Freq: Two times a day (BID) | RESPIRATORY_TRACT | Status: DC
  Administered 2021-12-20 – 2021-12-26 (×12): 2 mL via RESPIRATORY_TRACT
  Filled 2021-12-20 (×14): qty 4

## 2021-12-20 MED ORDER — VANCOMYCIN HCL 1250 MG/250ML IV SOLN
1250.0000 mg | Freq: Two times a day (BID) | INTRAVENOUS | Status: DC
  Administered 2021-12-20 – 2021-12-21 (×2): 1250 mg via INTRAVENOUS
  Filled 2021-12-20 (×3): qty 250

## 2021-12-20 MED ORDER — LACTATED RINGERS IV SOLN: INTRAVENOUS | Status: DC

## 2021-12-20 MED ORDER — ORAL CARE MOUTH RINSE
15.0000 mL | Freq: Two times a day (BID) | OROMUCOSAL | Status: DC
  Administered 2021-12-21 (×2): 15 mL via OROMUCOSAL

## 2021-12-20 NOTE — Progress Notes (Signed)
Pharmacy Antibiotic Note ? ?Corey [name] is a 76 y.o. male admitted on 12/20/2021 with pneumonia.  Pharmacy has been consulted for vancomycin dosing. Recently treated for same during Jan-Feb admission at [facility]. Seen yesterday by Oncology team for new SOB and productive cough x 2 wks and started on Omnicef & Doxycycline. ? ?Plan: ?Vancomycin 2000 mg IV now, then 1250 mg IV q12 hr (est AUC 421 based on SCr 0.8; Vd 0.72) ?Measure vancomycin AUC at steady state as indicated ?SCr q48 while on vanc ?MRSA PCR ordered; f/u and narrow vanc as appropriate ?Rocephin & doxycycline per MD; dosing appropriate ? ?Height: 6\' 1"  (185.4 cm) ?Weight: 102.5 kg (226 lb) ?IBW/kg (Calculated) : 79.9 ? ?Temp (24hrs), Avg:97.6 ?F (36.4 ?C), Min:97.4 ?F (36.3 ?C), Max:97.7 ?F (36.5 ?C) ? ?Recent Labs  ?Lab 12/20/21 ?0656 12/20/21 ?0700 12/20/21 ?0850  ?WBC 7.9  --   --   ?CREATININE 0.77  --   --   ?LATICACIDVEN  --  1.0 1.0  ?  ?Estimated Creatinine Clearance: 101.9 mL/min (by C-G formula based on SCr of 0.77 mg/dL).   ? ?Allergies  ?Allergen Reactions  ? Amoxicillin Anaphylaxis and Swelling  ?  Tolerates Rocephin  ? Fosinopril Other (See Comments) and Cough  ?  Muscle aches ?(Monopril)  ? Hydromorphone Nausea And Vomiting  ? Ampicillin Rash  ? Codeine Nausea Only and Other (See Comments)  ?  Heavy amounts cause nausea and stomach upset  ? Rosuvastatin Other (See Comments)  ?  Joint pain  ? ? ?Antimicrobials this admission: ?PTA Omnicef/Doxy 3/18 >> 3/19 ?3/19 Vancomycin >>  ?3/19 Rocephin >>  ?3/19 Doxycycline >>  ? ?Dose adjustments this admission: ? ?Microbiology results: ?3/19 BCx: sent ?3/19 UCx: sent  ?3/19 Flu/Covid: neg/neg  ?3/19 MRSA PCR: ordered ? ?Thank you for allowing pharmacy to be a part of this patient?s care. ? ?Rhyatt Muska A ?12/20/2021 12:58 PM ? ?

## 2021-12-20 NOTE — Progress Notes (Signed)
RT called to placed patient on BIPAP. Patient was lethargic and tachypnea. Patient is tolerating BIPAP well at this time. With saturation 97% on these settings 20/6 rate 18 40% FIO2      ABG in thirty min. RT will continue to monitor ?

## 2021-12-20 NOTE — ED Triage Notes (Signed)
Patient BIB EMS for evaluation of shortness of breath and "fluid in lungs."  Patient has lung CA and recently decided to stop chemo treatment.  Is currently on hospice. Last chemo was in January.  Began to have cough and "yellow phlegm."  Was started on two oral antibiotics.  No reports of pain ?

## 2021-12-20 NOTE — Sepsis Progress Note (Signed)
Sepsis protocol is being followed by [facility]. 

## 2021-12-20 NOTE — Progress Notes (Signed)
A consult was received from an ED physician for Vancomycin and Levofloxacin per pharmacy dosing.   ? ?Pharmacist asked to investigate beta-lactam allergy. If history of intolerance, mild allergy, or documented history of use of cephalosporins, pharmacy can adjust levofloxacin to cefepime and azithromycin. ? ?Allergy: Amoxicillin (allergy notes patient tolerates cephalosporins).  Review of antibiotic usage in epic shows patient has received cephalosporins (including cefdinir, cefepime and ceftriaxone) numerous times. ? ?The patient's profile has been reviewed for ht/wt/allergies/indication/available labs.   ? ?Order for Levofloxacin has been discontinued and a one time order has been placed for Vancomycin 2000mg  IV, Ceftriaxone 1gm IV and Azithromycin 500mg  IV x 1 dose each.   ? ?Further antibiotics/pharmacy consults should be ordered by admitting physician if indicated.       ?                ?Thank you, ?Everette Rank, PharmD ?12/20/2021  7:01 AM ? ?

## 2021-12-20 NOTE — Consult Note (Signed)
? ?NAME:  Corey [name], MRN:  [id number], DOB:  27-Apr-1946, LOS: 0 ?ADMISSION DATE:  12/20/2021, CONSULTATION DATE:  12/20/2021 ?REFERRING MD: Dr. Eulis Foster CHIEF COMPLAINT: Respiratory failure ? ?History of Present Illness:  ?Patient came in with complaints of worsening shortness of breath, productive cough ? ?Breathing has been difficult the last few weeks, just became increasingly difficult in the last few days ?Has had multiple courses of antibiotics, spouse had recently called for a refill of antibiotics ? ?History of lung cancer, palliative treatments has he had failed recent chemotherapy history ?Review of obstructive sleep apnea for which he uses CPAP ?Uses oxygen at home at about 4 L history ?Coronary artery disease, chronic obstructive pulmonary disease, atrial fibrillation ? ?He had received radiation therapy ?Chemotherapy was discontinued January 2023 ? ?Pertinent  Medical History  ? ?Past Medical History:  ?Diagnosis Date  ? Anemia   ? Anxiety   ? Arthritis   ? "back, right knee, hands, ankles, neck" (05/31/2016)  ? Atrial fibrillation with RVR ([place]) 11/03/2021  ? Carotid artery disease ([place])   ? Chronic lower back pain   ? COPD with acute exacerbation ([place]) 04/02/2021  ? Coronary atherosclerosis of native coronary artery   ? a. BMS to [facility] 2004 and 2007, otherwise mild nonobstructive disease. EF normal.  ? Diverticulitis   ? Dyslipidemia   ? Essential hypertension, benign   ? Family history of breast cancer   ? Family history of lung cancer   ? Family history of prostate cancer   ? GERD (gastroesophageal reflux disease)   ? Hyponatremia 06/04/2016  ? Lumbar radiculopathy, chronic 02/04/2015  ? Right L5  ? Lung cancer ([place])   ? 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 Obesity   ? OSA on CPAP   ? uses cpap  ? Parkinson's disease ([place])   ? Paroxysmal atrial fibrillation (HCC)   ? a. Discovered after stroke.  ? Pleural effusion on left 02/24/2021  ? Pneumonia 01/1996  ? PONV (postoperative nausea and vomiting)    ? Prostate CA [facility])   ? Recurrent upper respiratory infection (URI)   ? Stroke [facility]) 07/2012  ? no deficits  ? Symptomatic anemia   ? Visit for monitoring Tikosyn therapy 09/18/2019  ? ?Significant Hospital Events: ?Including procedures, antibiotic start and stop dates in addition to other pertinent events   ?Chest x-ray 12/20/2021 reviewed showing opacification of the left lung ?CT of the chest 12/20/2021-opacification of the left lung, volume loss on the left which may be related to atelectasis, mucous plugging, evidence of pulmonary hypertension, mediastinal adenopathy.  He does have a right pleural effusion.  Bilateral multifocal infiltrates ? ?Interim History / Subjective:  ?Worsening shortness of breath ?Currently on BiPAP ? ?Objective   ?Blood pressure 92/72, pulse (!) 115, temperature 97.7 ?F (36.5 ?C), temperature source Oral, resp. rate (!) 21, height 6\' 1"  (1.854 m), weight 102.5 kg, SpO2 94 %. ?   ?FiO2 (%):  [40 %] 40 %  ? ?Intake/Output Summary (Last 24 hours) at 12/20/2021 1125 ?Last data filed at 12/20/2021 0948 ?Gross per 24 hour  ?Intake 1350 ml  ?Output --  ?Net 1350 ml  ? ?Filed Weights  ? 12/20/21 0631  ?Weight: 102.5 kg  ? ? ?Examination: ?General: Elderly gentleman does not appear to be in distress, on BiPAP ?HENT: BiPAP mask in place ?Lungs: Decreased air movement bilaterally with very minimal air entry on the left ?Cardiovascular: S1-S2 appreciated ?Abdomen: Soft, bowel sounds appreciated ?Extremities: Peripheral edema ?Neuro:  Alert and oriented ?GU:  ? ?Resolved Hospital Problem list   ? ? ?Assessment & Plan:  ?Decompensation likely related to multilobar pneumonia ?No fever, no significant leukocytosis ?-Antibiotic therapy ?-On vancomycin, Rocephin, azithromycin ?-Azithromycin switched to doxycycline secondary to prolonged QT ?-Follow-up on cultures including Legionella and pneumococcal antibodies ?-Follow cultures ? 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 ? ?Acute hypoxemic respiratory failure ?-Continue BiPAP as tolerated ? ?Chronic obstructive pulmonary disease ?-Bronchodilators ?-On Xopenex, Pulmicort ? ?Mucous plugging ?-Nebulization treatments, Mucomyst, chest PT, Mucinex ?-May require escalation unfortunately this will mean ending up on the ventilator ? ?Atrial fibrillation with RVR ?-On diltiazem ? ?Unfortunately, CT scan changes could also be related to progressive metastatic disease ? ?Admitted to the hospitalist service stepdown unit ? ?CODE STATUS changed to DNR ? ?Best Practice (right click and "Reselect all SmartList Selections" daily)  ? ?Diet/type: Regular consistency (see orders) ?DVT prophylaxis: DOAC ?GI prophylaxis: N/A ?Lines: N/A ?Foley:  N/A ?Code Status:  DNR ?Last date of multidisciplinary goals of care discussion [per primary] ? ?Labs   ?CBC: ?Recent Labs  ?Lab 12/20/21 ?6789  ?WBC 7.9  ?NEUTROABS 6.0  ?HGB 9.3*  ?HCT 29.5*  ?MCV 103.5*  ?PLT 321  ? ? ?Basic Metabolic Panel: ?Recent Labs  ?Lab 12/20/21 ?3810  ?NA 128*  ?K 3.7  ?CL 94*  ?CO2 23  ?GLUCOSE 106*  ?BUN 12  ?CREATININE 0.77  ?CALCIUM 8.4*  ? ?GFR: ?Estimated Creatinine Clearance: 101.9 mL/min (by C-G formula based on SCr of 0.77 mg/dL). ?Recent Labs  ?Lab 12/20/21 ?0656 12/20/21 ?0700 12/20/21 ?0850  ?WBC 7.9  --   --   ?LATICACIDVEN  --  1.0 1.0  ? ? ?Liver Function Tests: ?Recent Labs  ?Lab 12/20/21 ?1751  ?AST 17  ?ALT 8  ?ALKPHOS 45  ?BILITOT 0.7  ?PROT 6.7  ?ALBUMIN 2.7*  ? ?No results for input(s): LIPASE, AMYLASE in the last 168 hours. ?No results for input(s): AMMONIA in the last 168 hours. ? ?ABG ?   ?Component Value Date/Time  ? PHART 7.29 (L) 12/20/2021 1003  ? PCO2ART 50 (H) 12/20/2021 1003  ? PO2ART 98 12/20/2021 1003  ? HCO3 24.0 12/20/2021 1003  ? TCO2 26 10/31/2012 1253  ? ACIDBASEDEF 2.7 (H) 12/20/2021 1003  ? O2SAT 97.6 12/20/2021 1003  ?  ? ?Coagulation Profile: ?Recent Labs  ?Lab  12/20/21 ?0258  ?INR 1.9*  ? ? ?Cardiac Enzymes: ?No results for input(s): CKTOTAL, CKMB, CKMBINDEX, TROPONINI in the last 168 hours. ? ?HbA1C: ?Hgb A1c MFr Bld  ?Date/Time Value Ref Range Status  ?07/07/2012 05:30 AM 5.9 (H) <5.7 % Final  ?  Comment:  ?  (NOTE) ?                                                                      ?According to the ADA Clinical Practice Recommendations for 2011, when ?HbA1c is used as a screening test: ? >=6.5%   Diagnostic of Diabetes Mellitus ?          (if abnormal result is confirmed) ?5.7-6.4%   Increased risk of developing Diabetes Mellitus ?References:Diagnosis and Classification of Diabetes Mellitus,Diabetes ?NIDP,8242,35(TIRWE 1):S62-S69 and Standards of  Medical Care in         ?Diabetes - 2011,Diabetes Care,2011,34 (Suppl 1):S11-S61.  ?07/06/2012 12:24 AM 6.0 (H) <5.7 % Final  ?  Comment:  ?  (NOTE) ?                                                                      ?According to the ADA Clinical Practice Recommendations for 2011, when ?HbA1c is used as a screening test: ? >=6.5%   Diagnostic of Diabetes Mellitus ?          (if abnormal result is confirmed) ?5.7-6.4%   Increased risk of developing Diabetes Mellitus ?References:Diagnosis and Classification of Diabetes Mellitus,Diabetes ?TAVW,9794,80(XKPVV 1):S62-S69 and Standards of Medical Care in         ?Diabetes - 2011,Diabetes Care,2011,34 (Suppl 1):S11-S61.  ? ? ?CBG: ?No results for input(s): GLUCAP in the last 168 hours. ? ?Review of Systems:   ?Worsening shortness of breath for few weeks ? ?Past Medical History:  ?He,  has a past medical history of Anemia, Anxiety, Arthritis, Atrial fibrillation with RVR ([place]) (11/03/2021), Carotid artery disease ([place]), Chronic lower back pain, COPD with acute exacerbation ([place]) (04/02/2021), Coronary atherosclerosis of native coronary artery, Diverticulitis, Dyslipidemia, Essential hypertension, benign, Family history of breast cancer, Family history of lung cancer, Family  history of prostate cancer, GERD (gastroesophageal reflux disease), Hyponatremia (06/04/2016), Lumbar radiculopathy, chronic (02/04/2015), Lung cancer ([place]), Mild dilation of ascending aorta ([place]) (08/2021), Obes

## 2021-12-20 NOTE — Progress Notes (Signed)
[facility] [address] [facility]) Hospitalized Hospice Patient ? ?[name] is a current hospice patient (admitted to hospice on 12/15/21); with a terminal diagnosis of NSCLC; who went to the hospital after becoming increasingly SOB. EMS was activated, hospice was notified and offered to visit in the home, but ultimately the decision was made to send him to the emergency room. [name] is admitted with acute on chronic hypoxic respiratory failure. This is a related hospital admission per Dr. Hollace Kinnier, [facility] MD. ? ?Report exchanged, pt currently receiving BiPAP in the ED.  ?He remains inpatient appropriate due to sepsis and need for IV fluids and abx.  ? ?V/S: 125/108, HR 140, RR 22, BiPAP 40%, SPO2 100% ?I&O: not recorded ?Labs: Na 128, albumin 2.7, RBC 2.85, hgb 9.3, HCT 29.5,  ?Diagnostics: CT chest - opacification of the left lung with volume loss that may be associated with atelectasis, mucous plugging, or further disease process; along with right pleural effusion; bilateral multifocal infiltrates ?IV/PRN: diltiazem infusion 7.5 mg/hr continuous for afib RVR, zofran 4 mg IV x 1 for nausea, zithromax 500 mg IV x 1 for PNA, rocephin 1 g IV x 1 for PNA, LR bolus 1 L x 2 for sepsis, LR continuous @ 150 ml/hr, vancomycin 2g IV x 1 for PNA ? ?Problem list: ?- multilobar PNA - afebrile, no leukocytosis. Vancomycin, rocephin and azithromycin (to be switched to doxy secondary to prolonged QT) ?- acute hypoxic respiratory failure - BiPAP 40% as tolerated ?- afib RVR - on diltiazem gtt ?- NSCLC - under hospice services (just recently and per our notes family was not ready to discuss EOL); PMT consulted and we appreciate any help with determining GOC ? ?GOC: Pt is DNR, however full medical scope  ?D/C planning: ongoing, unclear if he will improve and be able to d/c back home ?Family: present and updated ?IDT: hospice team updated ? ?If transport should be needed at discharge, please use [facility] as they  contract this service for our hospice patients. ? ?Transfer summary and med list to shadow chart. ? ?Venia Carbon BSN, RN ?[facility] Liaison  ? ? ?

## 2021-12-20 NOTE — ED Notes (Signed)
Patient oxygen sat drop to 82% on 5L [place]. Patient is place on non rebreather at this time and oxygen sat is 99-100 on non rebreather. Provider is aware and at bedside with patient.  ?

## 2021-12-20 NOTE — TOC Initial Note (Signed)
Transition of Care (TOC) - Initial/Assessment Note  ? ? ?Patient Details  ?Name: Corey [name] ?MRN: [id number] ?Date of Birth: 08/24/1946 ? ?Transition of Care (TOC) CM/SW Contact:    ?Tawanna Cooler, RN ?Phone Number: ?12/20/2021, 3:08 PM ? ?Clinical Narrative:                 ? ?Hx of lung cancer, no longer on chemo.  Has home O2, home CPAP.  On [place].  TOC following.  ? ? ?Expected Discharge Plan: [place] ?Barriers to Discharge: Continued Medical Work up ? ? ?Expected Discharge Plan and Services ?Expected Discharge Plan: [place] ?  ?  ?  ?Living arrangements for the past 2 months: [place] ?                ? ?Prior Living Arrangements/Services ?Living arrangements for the past 2 months: [place] ?Lives with:: Spouse ?Patient language and need for interpreter reviewed:: Yes ?Do you feel safe going back to the place where you live?: Yes      ?Need for Family Participation in Patient Care: Yes (Comment) ?Care giver support system in place?: Yes (comment) ?  ?Criminal Activity/Legal Involvement Pertinent to Current Situation/Hospitalization: No - Comment as needed ?  ?  ?Alcohol / Substance Use: Not Applicable ?Psych Involvement: No (comment) ? ?Admission diagnosis:  Shortness of breath [R06.02] ?Acute on chronic respiratory failure with hypoxia (HCC) [J96.21] ?Community acquired pneumonia of right lower lobe of lung [J18.9] ?Patient Active Problem List  ? Diagnosis Date Noted  ? Acute on chronic respiratory failure with hypoxia ([place]) 12/20/2021  ? COPD (chronic obstructive pulmonary disease) ([place]) 11/21/2021  ? Chronic respiratory failure with hypoxia 11/21/2021  ? Paroxysmal atrial fibrillation with RVR ([place]) 11/21/2021  ? Pressure injury of skin 11/16/2021  ? Dysarthria 11/10/2021  ? Stasis dermatitis 11/06/2021  ? Prolonged Q-T interval on ECG 11/04/2021  ? Macrocytic anemia 11/03/2021  ? Streptococcal sore throat   ? Acute on chronic anemia   ?  Supplemental oxygen dependent   ? Notalgia   ? Adjustment disorder with mixed anxiety and depressed mood   ? Physical deconditioning 08/12/2021  ? Malaise 07/28/2021  ? Port-A-Cath in place 04/27/2021  ? Genetic testing 04/09/2021  ? 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 CAP (community acquired pneumonia) 04/01/2021  ? Family history of prostate cancer 03/20/2021  ? Family history of breast cancer 03/20/2021  ? Family history of lung cancer 03/20/2021  ? Goals of care, counseling/discussion 03/18/2021  ? Encounter for antineoplastic chemotherapy 03/18/2021  ? Encounter for antineoplastic immunotherapy 03/18/2021  ? Primary cancer of left lower lobe of lung ([place]) 03/07/2021  ? Non-small cell carcinoma of left lung, stage 4 ([place]) 03/05/2021  ? Malnutrition of moderate degree 02/25/2021  ? Hilar mass   ? Reactive airway disease 11/20/2020  ? Allergic rhinitis with non-allergic component 09/18/2020  ? Heartburn 09/18/2020  ? Malignant neoplasm of prostate ([place]) 03/04/2020  ? Pre-syncope 02/27/2020  ? Cerebral embolism with transient ischemic attack (TIA) 02/11/2020  ? History of stroke 02/11/2020  ? Embolic stroke involving cerebral artery ([place]) 02/11/2020  ? Secondary hypercoagulable state ([place]) 09/10/2019  ? Essential tremor 06/25/2019  ? Morbid obesity ([place]) 05/27/2017  ? Carotid artery disease ([place]) 08/30/2016  ? Essential hypertension 08/30/2016  ? Iron deficiency 01/05/2016  ? Lumbar radiculopathy, chronic 02/04/2015  ? SVT (supraventricular tachycardia) ([place]) 12/03/2014  ? PAC (premature atrial contraction) 04/05/2014  ? OSA  on CPAP 03/27/2014  ? AF (paroxysmal atrial fibrillation) ([place]) 01/05/2013  ? CVA (cerebral infarction) 07/06/2012  ? Coronary artery disease   ? Benign essential HTN   ? HLD (hyperlipidemia)   ? Shortness of breath   ? GERD (gastroesophageal reflux disease)   ? ?PCP:  [name], MD ?Pharmacy:   ?[facility] - [address] [place] ?[address] ?[place] [address] ?Phone: [phone] Fax: [phone] ? ?[id number] , [id number]?Phone: [phone]: [phone]? ? ?Readmission Risk Interventions ?Readmission Risk Prevention Plan 12/20/2021 08/05/2021  ?Transportation Screening Complete Complete  ?Medication Review (Press photographer Complete -  ?PCP or Specialist appointment within 3-5 days of discharge Complete Complete  ?[place] Care Consult Complete Complete  ?SW Recovery Care/Counseling Consult Complete Complete  ?Palliative Care Screening Complete Not Applicable  ?[place] Applic[place] Not Applicable  ?Some recent data might be hidden  ? ? ? ?

## 2021-12-20 NOTE — ED Notes (Signed)
Respiratory called, pt on Bi-pap and per Eulis Foster, MD feels as if he is going to vomit. Kandice Moos, RN at bedside.  ?

## 2021-12-20 NOTE — Sepsis Progress Note (Signed)
Notified bedside nurse of need to draw lactic acid.  

## 2021-12-20 NOTE — ED Notes (Addendum)
Pt had Non-rebreather taken off face, and reported that he felt like he was dying. Non-rebreather placed back on face. Nurse advised. ?

## 2021-12-20 NOTE — ED Provider Notes (Signed)
?[facility] DEPT ?Provider Note ? ? ?CSN: [id number] ?Arrival date & time: 12/20/21  9563 ? ?  ? ?History ? ?Chief Complaint  ?Patient presents with  ? Shortness of Breath  ? ? ?Corey [name] is a 76 y.o. 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  Patient received palliative radiotherapy 03/2021 and was previously receiving palliative systemic chemotherapy however this was discontinued January 2023 due to intolerance. ? ?Patient presents emergency department with a chief complaint of shortness of breath and productive cough.  Patient reports that after his last hospitalization his shortness of breath and cough had improved.  He has developed a productive cough over the last 2 weeks.  Cough is producing yellow sputum.  Patient states that this has gradually worsening shortness of breath.  Patient states that last night shortness of breath became so severe that he came to the hospital for evaluation.  Additionally patient reports that he was started on antibiotics by his oncology team yesterday due to concerns for community-acquired pneumonia.  Per chart review patient was started on cefdinir and doxycycline.  Additionally patient reports that he has some swelling to bilateral lower legs over the last 2 weeks.  States that left leg is always more swollen than right. ? ?Patient denies any fever, chills, hemoptysis, chest pain, palpitations, abdominal pain, nausea, vomiting, diarrhea, dysuria, hematuria, urinary urgency, urinary frequency, lightheadedness, syncope ? ? ? ? ?Shortness of Breath ?Associated symptoms: cough   ?Associated symptoms: no abdominal pain, no chest pain, no fever, no headaches, no neck pain, no rash and no vomiting   ? ?  ? ?Home Medications ?Prior to Admission  medications   ?Medication Sig Start Date End Date Taking? Authorizing Provider  ?acetaminophen (TYLENOL) 325 MG tablet Take 2 tablets (650 mg total) by mouth every 6 (six) hours as needed for mild pain (or Fever >/= 101). 08/25/21   Angiulli, Lavon Paganini, PA-C  ?albuterol (PROVENTIL) (2.5 MG/3ML) 0.083% nebulizer solution Inhale 3 mLs into the lungs every 4 (four) hours as needed for wheezing or shortness of breath. ?Patient taking differently: Inhale 3 mLs into the lungs See admin instructions. Nebulize 2.5 mg & inhale into the lungs 3-4 times a day and may also use an additional 2.5 mg up to two times a day as needed for shortness of breath 08/28/21   Angiulli, Lavon Paganini, PA-C  ?ALPRAZolam (XANAX) 0.5 MG tablet Take 1 tablet (0.5 mg total) by mouth at bedtime as needed for anxiety. 11/26/21   Aline August, MD  ?amiodarone (PACERONE) 200 MG tablet Take 1 tablet (200 mg total) by mouth daily. 11/26/21   Aline August, MD  ?benzonatate (TESSALON) 200 MG capsule Take 1 capsule (200 mg total) by mouth 3 (three) times daily. ?Patient taking differently: Take 200 mg by mouth in the morning, at noon, and at bedtime. 10/07/21   Heilingoetter, Cassandra L, PA-C  ?budesonide (PULMICORT) 0.5 MG/2ML nebulizer solution Take 2 mLs (0.5 mg total) by nebulization 2 (two) times daily. ?Patient taking differently: Take 0.5 mg by nebulization in the morning and at bedtime. 09/25/21   Garner Nash, DO  ?carbidopa-levodopa (SINEMET IR) 25-100 MG tablet Take 1 tablet by mouth See admin instructions. Take 1 tablet by mouth at 8 AM, 1 PM, 7 PM, and bedtime (10 PM) 11/26/21  Aline August, MD  ?cefdinir (OMNICEF) 300 MG capsule Take 1 capsule (300 mg total) by mouth 2 (two) times daily. 12/18/21   Heilingoetter, Cassandra L, PA-C  ?Dextromethorphan-Menthol (DELSYM COUGH RELIEF MT) Use as directed 10 mLs in the mouth or throat at bedtime as needed (for cough).    [provider]  ?diltiazem (CARDIZEM CD) 360 MG 24 hr capsule Take 1  capsule (360 mg total) by mouth daily. 11/27/21   Aline August, MD  ?doxycycline (VIBRA-TABS) 100 MG tablet Take 1 tablet (100 mg total) by mouth 2 (two) times daily. 12/18/21   Heilingoetter, Cassandra L, PA-C  ?fenofibrate 160 MG tablet TAKE 1 TABLET DAILY. PLEASE KEEP UPCOMING APPT IN MARCH WITH DR. Acie Fredrickson BEFORE ANYMORE REFILLS. ?Patient taking differently: Take 160 mg by mouth See admin instructions. Take 160 mg by mouth at 2 PM daily 08/25/21   Angiulli, Lavon Paganini, PA-C  ?fluticasone (FLONASE) 50 MCG/ACT nasal spray Place 2 sprays into both nostrils daily. 08/25/21   Angiulli, Lavon Paganini, PA-C  ?folic acid (FOLVITE) 1 MG tablet Take 1 tablet (1 mg total) by mouth daily. 08/25/21   Angiulli, Lavon Paganini, PA-C  ?guaiFENesin (MUCINEX) 600 MG 12 hr tablet Take 2 tablets (1,200 mg total) by mouth 2 (two) times daily. 08/12/21   Kathie Dike, MD  ?ipratropium (ATROVENT) 0.03 % nasal spray Place 1-2 sprays into both nostrils every 12 (twelve) hours. For drainage ?Patient taking differently: Place 2 sprays into both nostrils in the morning. 08/28/21   Angiulli, Lavon Paganini, PA-C  ?levalbuterol (XOPENEX) 0.63 MG/3ML nebulizer solution Take 3 mLs (0.63 mg total) by nebulization 2 (two) times daily. 09/25/21   Garner Nash, DO  ?levocetirizine (XYZAL) 5 MG tablet Take 1 tablet (5 mg total) by mouth every evening. 12/16/21   Garnet Sierras, DO  ?loratadine (CLARITIN) 10 MG tablet Take 1 tablet (10 mg total) by mouth daily as needed for allergies (nasal drainage). ?Patient taking differently: Take 10 mg by mouth daily as needed for allergies. 08/25/21   Angiulli, Lavon Paganini, PA-C  ?magnesium oxide (MAG-OX) 400 (240 Mg) MG tablet Take 1 tablet (400 mg total) by mouth daily. 08/25/21   Angiulli, Lavon Paganini, PA-C  ?metoprolol succinate (TOPROL-XL) 25 MG 24 hr tablet Take 1 tablet (25 mg total) by mouth daily. 08/25/21   Cathlyn Parsons, PA-C  ?Multiple Vitamins-Minerals (MULTIVITAMINS THER. W/MINERALS) TABS Take 1 tablet by mouth  daily with breakfast.    [provider]  ?nitroGLYCERIN (NITROSTAT) 0.4 MG SL tablet Place 1 tablet (0.4 mg total) under the tongue every 5 (five) minutes as needed for chest pain. ?Patient not taking: Reported on 11/21/2021 08/25/21   Cathlyn Parsons, PA-C  ?omeprazole (PRILOSEC) 20 MG capsule Take 1 capsule (20 mg total) by mouth daily before breakfast. 11/26/21   Aline August, MD  ?ondansetron (ZOFRAN) 8 MG tablet Take 8 mg by mouth every 8 (eight) hours as needed for nausea or vomiting.    [provider]  ?polyethylene glycol (MIRALAX / GLYCOLAX) 17 g packet Take 17 g by mouth daily. 11/26/21   Aline August, MD  ?potassium chloride (KLOR-CON) 10 MEQ tablet TAKE 1 TABLET BY MOUTH EVERY DAY ?Patient taking differently: Take 10 mEq by mouth See admin instructions. Take 10 mEq by mouth at 2 PM daily 07/16/21   Nahser, Wonda Cheng, MD  ?rivaroxaban (XARELTO) 20 MG TABS tablet Take 1 tablet (20 mg total) by mouth daily. ?Patient taking differently: Take 20 mg by mouth every evening.  08/25/21   Cathlyn Parsons, PA-C  ?Saccharomyces boulardii (PROBIOTIC) 250 MG CAPS Take 250 mg by mouth daily.    [provider]  ?senna-docusate (SENOKOT-S) 8.6-50 MG tablet Take 1 tablet by mouth 2 (two) times daily. 11/20/21   Shelly Coss, MD  ?tamsulosin (FLOMAX) 0.4 MG CAPS capsule Take 1 capsule (0.4 mg total) by mouth daily after supper. 08/25/21   Cathlyn Parsons, PA-C  ?Tiotropium Bromide-Olodaterol (STIOLTO RESPIMAT) 2.5-2.5 MCG/ACT AERS Inhale 2 puffs into the lungs daily. 09/25/21   Garner Nash, DO  ?topiramate (TOPAMAX) 50 MG tablet Take 1-2 tablets (50-100 mg total) by mouth See admin instructions. Take 50mg  by mouth in the morning, then 100mg  at night 11/26/21   Aline August, MD  ?Vitamin D, Ergocalciferol, (DRISDOL) 1.25 MG (50000 UNIT) CAPS capsule Take 1 capsule (50,000 Units total) by mouth every 7 (seven) days. Monday ?Patient taking differently: Take 50,000 Units by mouth  every Monday. 10/05/21   Brunetta Genera, MD  ?   ? ?Allergies    ?Amoxicillin, Fosinopril, Hydromorphone, Ampicillin, Codeine, and Rosuvastatin   ? ?Review of Systems   ?Review of Systems  ?Constitutional:

## 2021-12-20 NOTE — H&P (Signed)
?History and Physical  ? ? ?Patient: Corey [name] AST:[id number] DOB: 07-02-1946 ?DOA: 12/20/2021 ?DOS: the patient was seen and examined on 12/20/2021 ?PCP: [name], MD  ?Patient coming from: Home ? ?Chief Complaint:  ?Chief Complaint  ?Patient presents with  ? Shortness of Breath  ? ?HPI: Corey [name] is a 76 y.o. male with medical history significant of pAfib, S4 NSCLC w/ me, chronic hypoxic respiratory failure on 4L [place], COPD, HLD, HTN, GERD, CAD, chronic low back pain. Presenting with dyspnea. He reports that he's had cough for the last 2 weeks or so. It is productive of yellow sputum. He has not had any fevers. It has not responded to his normal inhaler treatment. He denies any sick contacts. He spoke with his oncology team about this on 3/17, and he was started on abx coverage for CAP. When his symptoms did not improve last night, he decided to come to the ED for help. He denies any other aggravating or alleviating factors.  ?  ?Review of Systems: As mentioned in the history of present illness. All other systems reviewed and are negative. ?Past Medical History:  ?Diagnosis Date  ? Anemia   ? Anxiety   ? Arthritis   ? "back, right knee, hands, ankles, neck" (05/31/2016)  ? Atrial fibrillation with RVR ([place]) 11/03/2021  ? Carotid artery disease ([place])   ? Chronic lower back pain   ? COPD with acute exacerbation ([place]) 04/02/2021  ? Coronary atherosclerosis of native coronary artery   ? a. BMS to [facility] 2004 and 2007, otherwise mild nonobstructive disease. EF normal.  ? Diverticulitis   ? Dyslipidemia   ? Essential hypertension, benign   ? Family history of breast cancer   ? Family history of lung cancer   ? Family history of prostate cancer   ? GERD (gastroesophageal reflux disease)   ? Hyponatremia 06/04/2016  ? Lumbar radiculopathy, chronic 02/04/2015  ? Right L5  ? Lung cancer ([place])   ? Mild dilation of ascending aorta ([place]) 08/2021  ? Obesity   ? OSA on CPAP   ? uses cpap  ? Parkinson's disease ([place])   ?  Paroxysmal atrial fibrillation (H[facility])   ? a. Discovered after stroke.  ? Pleural effusion on left 02/24/2021  ? Pneumonia 01/1996  ? PONV (postoperative nausea and vomiting)   ? Prostate CA [facility])   ? Recurrent upper respiratory infection (URI)   ? Stroke [facility]) 07/2012  ? no deficits  ? Symptomatic anemia   ? Visit for monitoring Tikosyn therapy 09/18/2019  ? ?Past Surgical History:  ?Procedure Laterality Date  ? ADENOIDECTOMY    ? ANTERIOR CERVICAL DECOMP/DISCECTOMY FUSION  07/2001; 10/2002  ? "C5-6; C6-7; redo"  ? BACK SURGERY    ? CARPAL TUNNEL RELEASE Left 10/2015  ? CHEST TUBE INSERTION Left 03/17/2021  ? Procedure: INSERTION PLEURAL DRAINAGE CATHETER;  Surgeon: Garner Nash, DO;  Location: [facility] ENDOSCOPY;  Service: Pulmonary;  Laterality: Left;  Indwelling Tunneled Pleural catheter (PLEUREX)   ? COLONOSCOPY W/ POLYPECTOMY  02/2014  ? CORONARY ANGIOPLASTY WITH STENT PLACEMENT  05/2003; 12/2005  ? "mid RCA; mid RCA"  ? FINE NEEDLE ASPIRATION  02/26/2021  ? Procedure: FINE NEEDLE ASPIRATION (FNA) LINEAR;  Surgeon: Candee Furbish, MD;  Location: [facility] ENDOSCOPY;  Service: Pulmonary;;  ? HAND SURGERY  02/2019  ? LEFT HAND  ? implantable loop recorder placement  02/27/2020  ? Medtronic Reveal [place] model [id number]  implantable loop recorder implanted by Dr Rayann Heman  for afib management and evaluation of presyncope  ? IR IMAGING GUIDED PORT INSERTION  04/16/2021  ? JOINT REPLACEMENT    ? KNEE ARTHROSCOPY Left 10/2005  ? KNEE ARTHROSCOPY W/ PARTIAL MEDIAL MENISCECTOMY Left 09/2005  ? LUMBAR LAMINECTOMY/DECOMPRESSION MICRODISCECTOMY  03/2005  ? "L4-5"  ? POSTERIOR LUMBAR FUSION  10/2003  ? L5-S1; "plates, screws"  ? SHOULDER ARTHROSCOPY Right 08/2011  ? Debridement of labrum, arthroscopic distal clavicle excision  ? SHOULDER OPEN ROTATOR CUFF REPAIR Left 07/2014  ? TEE WITHOUT CARDIOVERSION  07/07/2012  ? Procedure: TRANSESOPHAGEAL ECHOCARDIOGRAM (TEE);  Surgeon: Fay Records, MD;  Location: [facility];  Service:  Cardiovascular;  Laterality: N/A;  ? THORACENTESIS N/A 02/25/2021  ? Procedure: THORACENTESIS;  Surgeon: Juanito Doom, MD;  Location: [facility];  Service: Cardiopulmonary;  Laterality: N/A;  ? TONSILLECTOMY AND ADENOIDECTOMY  ~ 1956  ? TOTAL KNEE ARTHROPLASTY Left 10/2006  ? TRIGGER FINGER RELEASE Left 10/2015  ? VIDEO BRONCHOSCOPY WITH ENDOBRONCHIAL ULTRASOUND Left 02/26/2021  ? Procedure: VIDEO BRONCHOSCOPY WITH ENDOBRONCHIAL ULTRASOUND;  Surgeon: Candee Furbish, MD;  Location: [facility] ENDOSCOPY;  Service: Pulmonary;  Laterality: Left;  cryoprobe too thanks!  ? ?Social History:  reports that he quit smoking about 18 years ago. His smoking use included cigarettes. He has a 34.00 pack-year smoking history. He has never been exposed to tobacco smoke. He has never used smokeless tobacco. He reports that he does not drink alcohol and does not use drugs. ? ?Allergies  ?Allergen Reactions  ? Amoxicillin Anaphylaxis and Swelling  ?  Tolerates Rocephin  ? Fosinopril Other (See Comments) and Cough  ?  Muscle aches ?(Monopril)  ? Hydromorphone Nausea And Vomiting  ? Ampicillin Rash  ? Codeine Nausea Only and Other (See Comments)  ?  Heavy amounts cause nausea and stomach upset  ? Rosuvastatin Other (See Comments)  ?  Joint pain  ? ? ?Family History  ?Problem Relation Age of Onset  ? Hypertension Mother   ? Heart disease Father   ? Heart attack Father   ? Prostate cancer Brother 60  ? Parkinson's disease Brother   ? Lung cancer Paternal Aunt   ?     hx of smoking  ? Breast cancer Paternal Grandmother   ?     dx 55s, bilateral mastectomies  ? Heart attack Paternal Grandfather   ? Blindness Son   ? Colon cancer Neg Hx   ? Pancreatic cancer Neg Hx   ? ? ?Prior to Admission medications   ?Medication Sig Start Date End Date Taking? Authorizing Provider  ?acetaminophen (TYLENOL) 325 MG tablet Take 2 tablets (650 mg total) by mouth every 6 (six) hours as needed for mild pain (or Fever >/= 101). ?Patient taking differently: Take  650 mg by mouth in the morning and at bedtime. 08/25/21  Yes Angiulli, Lavon Paganini, PA-C  ?albuterol (PROVENTIL) (2.5 MG/3ML) 0.083% nebulizer solution Inhale 3 mLs into the lungs every 4 (four) hours as needed for wheezing or shortness of breath. ?Patient taking differently: Inhale 3 mLs into the lungs See admin instructions. Nebulize 2.5 mg & inhale into the lungs 3-4 times a day and may also use an additional 2.5 mg up to two times a day as needed for shortness of breath 08/28/21  Yes Angiulli, Lavon Paganini, PA-C  ?ALPRAZolam (XANAX) 0.5 MG tablet Take 1 tablet (0.5 mg total) by mouth at bedtime as needed for anxiety. 11/26/21  Yes Aline August, MD  ?amiodarone (PACERONE) 200 MG tablet Take 1 tablet (200  mg total) by mouth daily. 11/26/21  Yes Aline August, MD  ?benzonatate (TESSALON) 200 MG capsule Take 1 capsule (200 mg total) by mouth 3 (three) times daily. ?Patient taking differently: Take 200 mg by mouth in the morning, at noon, and at bedtime. 10/07/21  Yes Heilingoetter, Cassandra L, PA-C  ?budesonide (PULMICORT) 0.5 MG/2ML nebulizer solution Take 2 mLs (0.5 mg total) by nebulization 2 (two) times daily. ?Patient taking differently: Take 0.5 mg by nebulization in the morning and at bedtime. 09/25/21  Yes Icard, Leory Plowman L, DO  ?carbidopa-levodopa (SINEMET IR) 25-100 MG tablet Take 1 tablet by mouth See admin instructions. Take 1 tablet by mouth at 8 AM, 1 PM, 7 PM, and bedtime (10 PM) ?Patient taking differently: Take 1 tablet by mouth 4 (four) times daily. Take 1 tablet by mouth at 8 AM, 1 PM, 7 PM, and bedtime (10 PM) 11/26/21  Yes Aline August, MD  ?cefdinir (OMNICEF) 300 MG capsule Take 1 capsule (300 mg total) by mouth 2 (two) times daily. 12/18/21  Yes Heilingoetter, Cassandra L, PA-C  ?Dextromethorphan-Menthol (DELSYM COUGH RELIEF MT) Use as directed 10 mLs in the mouth or throat at bedtime as needed (for cough).   Yes [provider]  ?diltiazem (CARDIZEM CD) 360 MG 24 hr capsule Take 1 capsule  (360 mg total) by mouth daily. 11/27/21  Yes Aline August, MD  ?doxycycline (VIBRA-TABS) 100 MG tablet Take 1 tablet (100 mg total) by mouth 2 (two) times daily. 12/18/21  Yes Heilingoetter, Cassandra L, PA-C  ?fenofibrate 160

## 2021-12-20 NOTE — ED Provider Notes (Signed)
?  Face-to-face evaluation ? ?Patient seen by me at 9:48 AM.  This time he is on BiPAP, somewhat uncomfortable but began to be nauseated.  His brother is in the room and reports he did not see the patient prior to coming here today. ? ?History: Patient with lung cancer, hospice, currently palliative status recently stopping treatment with chemotherapy.  He has been treated for pneumonia recently. ? ?Physical exam: Alert elderly male.  He is cooperative.  On BiPAP, without significant increased work of breathing.   ? ?MDM: Evaluation for  ?Chief Complaint  ?Patient presents with  ? Shortness of Breath  ?  ? ?Patient with lung cancer, chest x-ray indicating complete opacity left side, likely due to effusion.  Doubt severe sepsis, or acute congestive heart failure.  Patient needs palliative support.  He may get transient benefit from thoracocentesis however it is unlikely to change the course of treatment. ? ?Medical screening examination/treatment/procedure(s) were conducted as a shared visit with non-physician practitioner(s) and myself.  I personally evaluated the patient during the encounter ? ?  ?Daleen Bo, MD ?12/21/21 1018 ? ?

## 2021-12-20 NOTE — Progress Notes (Signed)
Lower extremity venous has been completed.  ? ?Preliminary results in CV Proc.  ? ?Corey [name] ?12/20/2021 9:18 AM    ?

## 2021-12-21 ENCOUNTER — Inpatient Hospital Stay (HOSPITAL_COMMUNITY)

## 2021-12-21 ENCOUNTER — Encounter (HOSPITAL_COMMUNITY): Payer: Self-pay | Admitting: Internal Medicine

## 2021-12-21 DIAGNOSIS — C3492 Malignant neoplasm of unspecified part of left bronchus or lung: Secondary | ICD-10-CM

## 2021-12-21 DIAGNOSIS — R531 Weakness: Secondary | ICD-10-CM | POA: Diagnosis not present

## 2021-12-21 DIAGNOSIS — Z7189 Other specified counseling: Secondary | ICD-10-CM

## 2021-12-21 DIAGNOSIS — J449 Chronic obstructive pulmonary disease, unspecified: Secondary | ICD-10-CM

## 2021-12-21 DIAGNOSIS — Z9989 Dependence on other enabling machines and devices: Secondary | ICD-10-CM

## 2021-12-21 DIAGNOSIS — J90 Pleural effusion, not elsewhere classified: Secondary | ICD-10-CM | POA: Diagnosis not present

## 2021-12-21 DIAGNOSIS — Z515 Encounter for palliative care: Secondary | ICD-10-CM

## 2021-12-21 DIAGNOSIS — I480 Paroxysmal atrial fibrillation: Secondary | ICD-10-CM

## 2021-12-21 DIAGNOSIS — D539 Nutritional anemia, unspecified: Secondary | ICD-10-CM

## 2021-12-21 DIAGNOSIS — I10 Essential (primary) hypertension: Secondary | ICD-10-CM

## 2021-12-21 DIAGNOSIS — J9621 Acute and chronic respiratory failure with hypoxia: Secondary | ICD-10-CM | POA: Diagnosis not present

## 2021-12-21 DIAGNOSIS — J189 Pneumonia, unspecified organism: Secondary | ICD-10-CM | POA: Diagnosis present

## 2021-12-21 DIAGNOSIS — G4733 Obstructive sleep apnea (adult) (pediatric): Secondary | ICD-10-CM

## 2021-12-21 LAB — CBC
HCT: 27 % — ABNORMAL LOW (ref 39.0–52.0)
Hemoglobin: 8.4 g/dL — ABNORMAL LOW (ref 13.0–17.0)
MCH: 32.8 pg (ref 26.0–34.0)
MCHC: 31.1 g/dL (ref 30.0–36.0)
MCV: 105.5 fL — ABNORMAL HIGH (ref 80.0–100.0)
Platelets: 296 10*3/uL (ref 150–400)
RBC: 2.56 MIL/uL — ABNORMAL LOW (ref 4.22–5.81)
RDW: 17.7 % — ABNORMAL HIGH (ref 11.5–15.5)
WBC: 7.6 10*3/uL (ref 4.0–10.5)
nRBC: 0.4 % — ABNORMAL HIGH (ref 0.0–0.2)

## 2021-12-21 LAB — COMPREHENSIVE METABOLIC PANEL
ALT: 10 U/L (ref 0–44)
AST: 16 U/L (ref 15–41)
Albumin: 2.4 g/dL — ABNORMAL LOW (ref 3.5–5.0)
Alkaline Phosphatase: 36 U/L — ABNORMAL LOW (ref 38–126)
Anion gap: 11 (ref 5–15)
BUN: 15 mg/dL (ref 8–23)
CO2: 21 mmol/L — ABNORMAL LOW (ref 22–32)
Calcium: 8.4 mg/dL — ABNORMAL LOW (ref 8.9–10.3)
Chloride: 99 mmol/L (ref 98–111)
Creatinine, Ser: 0.91 mg/dL (ref 0.61–1.24)
GFR, Estimated: >60 mL/min (ref >60)
Glucose, Bld: 85 mg/dL (ref 70–99)
Potassium: 4 mmol/L (ref 3.5–5.1)
Sodium: 131 mmol/L — ABNORMAL LOW (ref 135–145)
Total Bilirubin: 0.7 mg/dL (ref 0.3–1.2)
Total Protein: 5.8 g/dL — ABNORMAL LOW (ref 6.5–8.1)

## 2021-12-21 LAB — STREP PNEUMONIAE URINARY ANTIGEN: Strep Pneumo Urinary Antigen: NEGATIVE

## 2021-12-21 LAB — URINE CULTURE: Culture: NO GROWTH

## 2021-12-21 MED ORDER — PROCHLORPERAZINE EDISYLATE 10 MG/2ML IJ SOLN
10.0000 mg | Freq: Four times a day (QID) | INTRAMUSCULAR | Status: DC | PRN
  Administered 2021-12-21: 10 mg via INTRAVENOUS
  Filled 2021-12-21: qty 2

## 2021-12-21 MED ORDER — METOPROLOL TARTRATE 5 MG/5ML IV SOLN: 5.0000 mg | Freq: Three times a day (TID) | INTRAVENOUS | Status: DC | PRN

## 2021-12-21 MED ORDER — FUROSEMIDE 10 MG/ML IJ SOLN
40.0000 mg | Freq: Once | INTRAMUSCULAR | Status: AC
  Administered 2021-12-21: 40 mg via INTRAVENOUS
  Filled 2021-12-21: qty 4

## 2021-12-21 MED ORDER — POTASSIUM CHLORIDE 20 MEQ PO PACK
20.0000 meq | PACK | Freq: Once | ORAL | Status: AC
  Administered 2021-12-21: 20 meq via ORAL
  Filled 2021-12-21: qty 1

## 2021-12-21 MED ORDER — METOPROLOL TARTRATE 5 MG/5ML IV SOLN: 5.0000 mg | Freq: Four times a day (QID) | INTRAVENOUS | Status: DC | PRN

## 2021-12-21 MED ORDER — PANTOPRAZOLE SODIUM 40 MG PO TBEC
40.0000 mg | DELAYED_RELEASE_TABLET | Freq: Every day | ORAL | Status: DC
  Administered 2021-12-21 – 2021-12-26 (×6): 40 mg via ORAL
  Filled 2021-12-21 (×6): qty 1

## 2021-12-21 MED ORDER — DOXYCYCLINE HYCLATE 100 MG PO TABS
100.0000 mg | ORAL_TABLET | Freq: Two times a day (BID) | ORAL | Status: DC
  Administered 2021-12-21 – 2021-12-24 (×6): 100 mg via ORAL
  Filled 2021-12-21 (×6): qty 1

## 2021-12-21 NOTE — Assessment & Plan Note (Addendum)
Started empirically on Ceftriaxone IV, doxycycline IV and Vancomycin IV. ?MRSA PCR negative. Strep pneumoniae urine ag negative. RVP negative. Blood cultures also so far no growth. ?Patient was started on Cefdinir and doxycycline as an outpatient for treatment and received a two days of treatment.  ?Vancomycin discontinued.  ?I do not think that the amiodarone is responsible for the infiltrates seen on the CT scan. ?5 days antibiotic course completed on 3/23. ?

## 2021-12-21 NOTE — Progress Notes (Signed)
? ?NAME:  Corey [name], MRN:  [id number], DOB:  1946-06-16, LOS: 1 ?ADMISSION DATE:  12/20/2021, CONSULTATION DATE:  12/20/2021 ?REFERRING MD: Dr. Eulis Foster CHIEF COMPLAINT: Respiratory failure ? ?History of Present Illness:  ?76 y/o M admitted 3/19 with complaints of worsening shortness of breath, productive cough. ? ?Breathing has been difficult the last few weeks, just became increasingly difficult in the last few days ?Has had multiple courses of antibiotics, spouse had recently called for a refill of antibiotics. ? ?History of lung cancer, palliative treatments has he had failed recent chemotherapy history ?Review of obstructive sleep apnea for which he uses CPAP ?Uses oxygen at home at about 4 L history ?Coronary artery disease, chronic obstructive pulmonary disease, atrial fibrillation ? ?He had received radiation therapy ?Chemotherapy was discontinued January 2023 ? ?Pertinent  Medical History  ?Non-Small Cell Lung Cancer - last chemo 10/2021, on hospice  ?Left Pleural Effusion  ?COPD  ?OSA on CPAP  ?Chronic Hyponatremia  ?HTN  ?CAD  ?AF - hx of RVR, previously on tikosyn ?CVA ?GERD  ?Parkinson's Disease  ?Prostate Cancer  ? ?Significant Hospital Events: ?Including procedures, antibiotic start and stop dates in addition to other pertinent events   ?3/19 Admit, CXR showing opacification of the left lung, CT Chest with opacification of the left lung, volume loss on the left which may be related to atelectasis, mucous plugging, evidence of pulmonary hypertension, mediastinal adenopathy.  He does have a right pleural effusion.  Bilateral multifocal infiltrates. On BiPAP  ? ?Interim History / Subjective:  ?Afebrile  ?[place] 6L  ?I/O UOP 800 ml, -281 ml in last 24 hours  ?Pt hopeful to "get better and go home" ? ?Objective   ?Blood pressure 115/63, pulse (!) 130, temperature 98.6 ?F (37 ?C), temperature source Oral, resp. rate (!) 24, height 6\' 1"  (1.854 m), weight 102.5 kg, SpO2 95 %. ?   ?FiO2 (%):  [35 %-40 %] 35 %   ? ?Intake/Output Summary (Last 24 hours) at 12/21/2021 0801 ?Last data filed at 12/21/2021 5631 ?Gross per 24 hour  ?Intake 2056.1 ml  ?Output 200 ml  ?Net 1856.1 ml  ? ?Filed Weights  ? 12/20/21 0631  ?Weight: 102.5 kg  ? ? ?Examination: ?General: adult male lying in bed in NAD ?HEENT: MM pink/moist, anicteric, pupils =/reactive, [place] O2 ?Neuro: AAOx4, speech clear, MAE, generalized weakness, had difficulty sitting up in bed for exam  ?CV: s1s2 irregular, ? SR with PAC's on bedside monitor, no m/r/g ?PULM: non-labored at rest, bronchial breath sounds on left, rhonchi on right  ?GI: soft, bsx4 active  ?Extremities: warm/dry, 1+ BLE pre-tibial pitting edema  ?Skin: no rashes or lesions ? ?Resolved Hospital Problem list   ? ? ?Assessment & Plan:  ? ?Acute on Chronic Hypoxic Respiratory Failure ?Concern for Multilobar PNA with Mucus Plugging vs Cancer Progression  ?-continue abx per primary team  ?-strep antigen negative, no fevers or leukocytosis  ?-pulmonary  hygiene as able - encouraged flutter valve this am on exam  ?-follow intermittent CXR  ?-wean O2 for sats >88-95% ?-BiPAP PRN  ?-DNR  ?-mucomyst BID  ?-lasix x1 for volume removal  ? ?Stage IV non-small cell lung cancer with metastasis ?Unfortunately this may be progressive and contributing to CT scan changes.  Failed chemotherapy, last dose 10/2021 ?-supportive care  ?-reviewed concerns of acute process vs progression of disease with patient  ? ?Chronic obstructive pulmonary disease ?-continue pulmicort, xopenex neb ? ?Atrial fibrillation with RVR ?-tele monitoring ?-cardizem infusion per [facility]  ? ?  GOC  ?-DNR  ? ?Best Practice (right click and "Reselect all SmartList Selections" daily)  ?Diet/type: Regular consistency (see orders) ?DVT prophylaxis: DOAC ?GI prophylaxis: N/A ?Lines: N/A ?Foley:  N/A ?Code Status:  DNR ?Last date of multidisciplinary goals of care discussion: per primary ? ?  ?  ?Noe Gens, MSN, APRN, NP-C, AGACNP-BC ?[facility] ?12/21/2021, 8:02 AM ? ? ?Please see [email or web address] for pager details.  ? ?From 7A-7P if no response, please call [phone] ?After hours, please call Warren Lacy [phone] ? ? ? ? ?

## 2021-12-21 NOTE — Assessment & Plan Note (Addendum)
-  Continue Protonix while inpatient (substituted for Prilosec) ?

## 2021-12-21 NOTE — Hospital Course (Addendum)
Corey [name] is a 76 y.o. male with a history of paroxysmal atrial fibrillation, stage IV NSCLC, chronic respiratory failure, COPD, hyperlipidemia, hypertension, GERD, CAD. Patient presented secondary to worsening dyspnea. On imaging, patient found to have signs concerning for multifocal pneumonia in addition to mucous plugging. Patient started on antibiotics and Mucomyst. PCCM consulted on admission. ?SP thoracentesis 3/22.  Now heart rate improved.  On oral medication. ?

## 2021-12-21 NOTE — Assessment & Plan Note (Deleted)
Noted on CT chest. Right much greater than left.  ?-Lasix 40 mg IV x1 today ?

## 2021-12-21 NOTE — Assessment & Plan Note (Signed)
No wheezing noted on exam. Complicates current presentation. ?-Continue Pulmicort, Xopenex ?

## 2021-12-21 NOTE — Assessment & Plan Note (Addendum)
Patient follows with Dr. [name] as an outpatient. Previously on chemotherapy but now has no further options for treatment.  ?Enrolled in hospice. ?

## 2021-12-21 NOTE — Progress Notes (Signed)
? ?PROGRESS NOTE ? ? ? ?Corey [name]  ZLD:[id number] DOB: Mar 14, 1946 DOA: 12/20/2021 ?PCP: [name], MD ? ? ?Brief Narrative: ?DENT PLANTZ is a 76 y.o. male with a history of paroxysmal atrial fibrillation, stage IV NSCLC, chronic respiratory failure, COPD, hyperlipidemia, hypertension, GERD, CAD. Patient presented secondary to worsening dyspnea. On imaging, patient found to have signs concerning for multifocal pneumonia in addition to mucous plugging. Patient started on antibiotics and Mucomyst. PCCM consulted on admission. ? ? ?Assessment and Plan: ?* Acute on chronic respiratory failure with hypoxia (HCC) ?In setting of multifocal pneumonia, known lung cancer and concern for mucous plugging. Patient uses 4 L/min of oxygen at baseline. Patient required BiPAP on admission and has been weaned to 5-6 L/min via [place]. ?-Wean to baseline as able ? ?Multifocal pneumonia ?Noted on CT imaging. No leukocytosis. Afebrile. Complicated by known lung cancer (last chemotherapy treatment in January 2023). Started empirically on Ceftriaxone IV, doxycycline IV and Vancomycin IV. MRSA PCR negative. Strep pneumoniae urine ag negative. RVP negative. Blood cultures pending. Patient is on amiodarone as an outpatient which complicates picture. Patient was started on Cefdinir and doxycycline as an outpatient for treatment and received a two days of treatment. ?-Discontinue Vancomycin ?-Continue Ceftriaxone and switch to doxycycline PO ?-Follow blood cultures and legionella ag ?-Await sputum sample for culture ?-Hold amiodarone ? ?Paroxysmal atrial fibrillation with RVR ([place]) ?RVR in setting of lack of medication administration. Possibly contributed to by acute infection. Started on diltiazem drip. Toprol XL and amiodarone held secondary to NPO status on admission. ?-Continue diltiazem drip ?-Continue home Toprol XL ?-Hold amiodarone secondary to development of pulmonary infiltrates ?-Continue Xarelto ?-If cannot control, will consult  cardiology ? ?COPD (chronic obstructive pulmonary disease) ([place]) ?No wheezing noted on exam. Complicates current presentation. ?-Continue Pulmicort, Xopenex ? ?Macrocytic anemia ?Baseline hemoglobin between 8-9. ? ?Non-small cell carcinoma of left lung, stage 4 ([place]) ?Patient follows with Dr. [name] as an outpatient. Previously on chemotherapy but now has no further options for treatment. Enrolled in hospice but is currently DNR full scope. ? ?Bilateral pleural effusion ?Noted on CT chest. Right much greater than left. Lasix given per PCCM. ? ?Essential hypertension ?Normotensive. ?-Continue metoprolol ? ?OSA on CPAP ?Patient started on BiPAP and is currently ordered QHS. ? ?GERD (gastroesophageal reflux disease) ?-Resume PPI. Protonix while inpatient (substituted for Prilosec) ? ? ? ?DVT prophylaxis: Xarelto ?Code Status:   Code Status: DNR ?Family Communication: Wife on telephone ?Disposition Plan: Discharge home with hospice once oxygen and heart rate is stabilized ? ? ?Consultants:  ?PCCM/Pulmonology ? ?Procedures:  ?None ? ?Antimicrobials: ?Vancomycin ?Ceftriaxone ?Doxycycline  ? ? ?Subjective: ?Patient reports still having dyspnea but improved from initial presentation ? ?Objective: ?BP 117/73 (BP Location: Left Arm)   Pulse (!) 128   Temp 97.9 ?F (36.6 ?C) (Oral)   Resp (!) 24   Ht 6\' 1"  (1.854 m)   Wt 102.5 kg   SpO2 96%   BMI 29.82 kg/m?  ? ?Examination: ? ?General exam: Appears calm and comfortable. On 6 L/min of oxygen. ?Respiratory system: Clear to auscultation on left with right sided rhonchi. Respiratory effort normal. ?Cardiovascular system: S1 & S2 heard, RRR. No murmurs, rubs, gallops or clicks. ?Gastrointestinal system: Abdomen is nondistended, soft and nontender. No organomegaly or masses felt. Normal bowel sounds heard. ?Central nervous system: Alert and oriented. No focal neurological deficits. ?Musculoskeletal: 1+ BLE pitting edema. No calf tenderness ?Skin: No cyanosis. No  rashes ?Psychiatry: Judgement and insight appear normal. Mood & affect  appropriate.  ? ? ?Data Reviewed: I have personally reviewed following labs and imaging studies ? ?CBC ?Lab Results  ?Component Value Date  ? WBC 7.6 12/21/2021  ? RBC 2.56 (L) 12/21/2021  ? HGB 8.4 (L) 12/21/2021  ? HCT 27.0 (L) 12/21/2021  ? MCV 105.5 (H) 12/21/2021  ? MCH 32.8 12/21/2021  ? PLT 296 12/21/2021  ? MCHC 31.1 12/21/2021  ? RDW 17.7 (H) 12/21/2021  ? LYMPHSABS 0.4 (L) 12/20/2021  ? MONOABS 0.8 12/20/2021  ? EOSABS 0.2 12/20/2021  ? BASOSABS 0.1 12/20/2021  ? ? ? ?Last metabolic panel ?Lab Results  ?Component Value Date  ? NA 131 (L) 12/21/2021  ? K 4.0 12/21/2021  ? CL 99 12/21/2021  ? CO2 21 (L) 12/21/2021  ? BUN 15 12/21/2021  ? CREATININE 0.91 12/21/2021  ? GLUCOSE 85 12/21/2021  ? GFRNONAA >60 12/21/2021  ? GFRAA 70 06/25/2020  ? CALCIUM 8.4 (L) 12/21/2021  ? PHOS 4.1 08/05/2021  ? PROT 5.8 (L) 12/21/2021  ? ALBUMIN 2.4 (L) 12/21/2021  ? LABGLOB 3.0 10/08/2020  ? BILITOT 0.7 12/21/2021  ? ALKPHOS 36 (L) 12/21/2021  ? AST 16 12/21/2021  ? ALT 10 12/21/2021  ? ANIONGAP 11 12/21/2021  ? ? ?GFR: ?Estimated Creatinine Clearance: 89.6 mL/min (by C-G formula based on SCr of 0.91 mg/dL). ? ?Recent Results (from the past 240 hour(s))  ?Blood Culture (routine x 2)     Status: None (Preliminary result)  ? Collection Time: 12/20/21  7:00 AM  ? Specimen: BLOOD  ?Result Value Ref Range Status  ? Specimen Description   Final  ?  BLOOD RIGHT ANTECUBITAL ?Performed at [facility], [place] [address], [place], [place] [address] ?  ? Special Requests   Final  ?  BOTTLES DRAWN AEROBIC AND ANAEROBIC Blood Culture adequate volume ?Performed at [facility], [place] [address], [place], [place] [address] ?  ? Culture   Final  ?  NO GROWTH < 24 HOURS ?Performed at [facility], [place] [address] [place], [place] [address] ?  ? Report Status PENDING  Incomplete  ?Blood Culture (routine x 2)     Status: None  (Preliminary result)  ? Collection Time: 12/20/21  7:00 AM  ? Specimen: BLOOD  ?Result Value Ref Range Status  ? Specimen Description   Final  ?  BLOOD LEFT ANTECUBITAL ?Performed at [facility], [place] [address], [place], [place] [address] ?  ? Special Requests   Final  ?  BOTTLES DRAWN AEROBIC AND ANAEROBIC Blood Culture results may not be optimal due to an excessive volume of blood received in culture bottles ?Performed at [facility], [place] [address], [place], [place] [address] ?  ? Culture   Final  ?  NO GROWTH < 24 HOURS ?Performed at [facility], [place] [address] [place], [place] [address] ?  ? Report Status PENDING  Incomplete  ?Resp Panel by RT-PCR (Flu A&B, Covid) Nasopharyngeal Swab     Status: None  ? Collection Time: 12/20/21  7:17 AM  ? Specimen: Nasopharyngeal Swab; Nasopharyngeal(NP) swabs in vial transport medium  ?Result Value Ref Range Status  ? SARS Coronavirus 2 by RT PCR NEGATIVE NEGATIVE Final  ?  Comment: (NOTE) ?SARS-CoV-2 target nucleic acids are NOT DETECTED. ? ?The SARS-CoV-2 RNA is generally detectable in upper respiratory ?specimens during the acute phase of infection. The lowest ?concentration of SARS-CoV-2 viral copies this assay can detect is ?138 copies/mL. A negative  result does not preclude SARS-Cov-2 ?infection and should not be used as the sole basis for treatment or ?other patient management decisions. A negative result may occur with  ?improper specimen collection/handling, submission of specimen other ?than nasopharyngeal swab, presence of viral mutation(s) within the ?areas targeted by this assay, and inadequate number of viral ?copies(<138 copies/mL). A negative result must be combined with ?clinical observations, patient history, and epidemiological ?information. The expected result is Negative. ? ?Fact Sheet for Patients:  ?EntrepreneurPulse.com.au ? ?Fact Sheet for Healthcare Providers:   ?IncredibleEmployment.be ? ?This test is no t yet approved or cleared by the [place] FDA and  ?has been authorized for detection and/or diagnosis of SARS-CoV-2 by ?FDA under an Emergency Use Authorization (EUA). This EUA will remai

## 2021-12-21 NOTE — Assessment & Plan Note (Addendum)
Baseline hemoglobin between 8-9.  Currently stable.  Monitor. ?

## 2021-12-21 NOTE — Progress Notes (Signed)
PT not on BiPAP at this time. No respiratory distress noted at this time. Order changed to QHS and PRN ?

## 2021-12-21 NOTE — Consult Note (Signed)
Consultation Note Date: 12/21/2021   Patient Name: Corey [name]  DOB: September 27, 1946  MRN: [id number]  Age / Sex: 76 y.o., male  PCP: Darrow Bussing, MD Referring Physician: Narda Bonds, MD  Reason for Consultation: Establishing goals of care and symptom management.   HPI/Patient Profile: 76 y.o. male  admitted on 12/20/2021    Clinical Assessment and Goals of Care: 76 year old gentleman with a life limiting illness of lung cancer, was recently enrolled with home hospice services.  He has received chemotherapy and radiation in the past.  Underlying diseases also include coronary artery disease chronic obstructive pulmonary disease and atrial fibrillation.  Generally he is on 4 L supplemental oxygen via nasal cannula at home.  Patient brought into the hospital because of worsening shortness of breath and productive cough.  Admitted to stepdown unit at [facility] [place] under hospital medicine service with pulmonary critical care colleagues also following.  Hospice liaison is also following.  Palliative consultation has been requested for additional support and ongoing goals of care discussions.  Patient is awake alert resting in bed.  He states that he feels better than when he came in.  Wife and daughter present at the bedside. Palliative medicine is specialized medical care for people living with serious illness. It focuses on providing relief from the symptoms and stress of a serious illness. The goal is to improve quality of life for both the patient and the family.  Goals of care: Broad aims of medical therapy in relation to the patient's values and preferences. Our aim is to provide medical care aimed at enabling patients to achieve the goals that matter most to them, given the circumstances of their particular medical situation and their constraints.   Patient seen and  discussed with him and family  medication history reviewed.  Currently on Cardizem drip and also on antibiotics.   HCPOA  Wife and daughter who are at bedside.   SUMMARY OF RECOMMENDATIONS   Agree with DO NOT RESUSCITATE Continue current mode of care Recently connected with hospice services at home, hospice liaison's following.  Patient and family remain hopeful for some degree of stabilization/recovery so as to be able to go back home and resume home hospice services.  Monitor hospital course. Continue bowel regimen, patient complains of mild constipation, continue to monitor. Thank you for the consult.  Code Status/Advance Care Planning: DNR   Symptom Management:     Palliative Prophylaxis:  Delirium Protocol  Psycho-social/Spiritual:  Desire for further Chaplaincy support:yes Additional Recommendations: Education on Hospice  Prognosis:  < 6 months  Discharge Planning: Home with Hospice      Primary Diagnoses: Present on Admission:  Acute on chronic respiratory failure with hypoxia (HCC)  Benign essential HTN  Non-small cell carcinoma of left lung, stage 4 (HCC)  Essential hypertension  COPD (chronic obstructive pulmonary disease) (HCC)  GERD (gastroesophageal reflux disease)  Macrocytic anemia   I have reviewed the medical record, interviewed the patient and family, and examined the patient. The  following aspects are pertinent.  Past Medical History:  Diagnosis Date   Anemia    Anxiety    Arthritis    "back, right knee, hands, ankles, neck" (05/31/2016)   Atrial fibrillation with RVR (HCC) 11/03/2021   Carotid artery disease (HCC)    Chronic lower back pain    COPD with acute exacerbation (HCC) 04/02/2021   Coronary atherosclerosis of native coronary artery    a. BMS to [facility] 2004 and 2007, otherwise mild nonobstructive disease. EF normal.   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 Discovered after stroke.   Pleural effusion on left 02/24/2021   Pneumonia 01/1996   PONV (postoperative nausea and vomiting)    Prostate CA (HCC)    Stroke (HCC) 07/2012   no deficits   Symptomatic anemia    Visit for monitoring Tikosyn therapy 09/18/2019   Social History   Socioeconomic History   Marital status: Married    Spouse name: Renee   Number of children: 3   Years of education: 14   Highest education level: Not on file  Occupational History   Occupation: Retired    Associate Professor: DISABLED     Comment: Works at [employer] part time  Tobacco Use   Smoking status: Former    Packs/day: 1.00    Years: 34.00    Pack years: 34.00    Types: Cigarettes    Quit date: 05/05/2003    Years since quitting: 18.6    Passive exposure: Never   Smokeless tobacco: Never  Vaping Use   Vaping Use: Never used  Substance and Sexual Activity   Alcohol use: No    Alcohol/week: 0.0 standard drinks   Drug use: No   Sexual activity: Not Currently  Other Topics Concern   Not on file  Social History Narrative   Patient lives at home with spouse.   Caffeine Use:    Social Determinants of Corporate investment banker Strain: Not on file  Food Insecurity: Not on file  Transportation Needs: Not on file  Physical Activity: Not on file  Stress: Not on file  Social Connections: Not on file   Family History  Problem Relation Age of Onset   Hypertension Mother    Heart disease Father    Heart attack Father    Prostate cancer Brother 69   Parkinson's disease Brother    Lung cancer Paternal Aunt        hx of smoking    Breast cancer Paternal Grandmother        dx 54s, bilateral mastectomies   Heart attack Paternal Grandfather    Blindness Son    Colon cancer Neg Hx    Pancreatic cancer Neg Hx    Scheduled Meds:  acetylcysteine  2 mL Nebulization BID   budesonide  0.5 mg Nebulization BID   carbidopa-levodopa  1 tablet Oral 4 times per day   chlorhexidine  15 mL Mouth Rinse BID  Chlorhexidine Gluconate Cloth  6 each Topical Daily   fenofibrate  160 mg Oral Q1400   fluticasone  2 spray Each Nare Daily   folic acid  1 mg Oral Daily   furosemide  40 mg Intravenous Once   guaiFENesin  1,200 mg Oral BID   ipratropium  2 spray Each Nare Daily   levalbuterol  0.63 mg Nebulization BID   loratadine  10 mg Oral QPM   magnesium oxide  400 mg Oral Daily   mouth rinse  15 mL Mouth Rinse q12n4p   metoprolol succinate  25 mg Oral Daily   multivitamin with minerals  1 tablet Oral Daily   polyethylene glycol  17 g Oral Daily   potassium chloride  10 mEq Oral Q1400   potassium chloride  20 mEq Oral Once   rivaroxaban  20 mg Oral Q supper   senna-docusate  1 tablet Oral BID   tamsulosin  0.4 mg Oral QPC supper   topiramate  100 mg Oral QHS   topiramate  50 mg Oral Daily   Continuous Infusions:  sodium chloride 10 mL/hr at 12/21/21 0743   cefTRIAXone (ROCEPHIN)  IV Stopped (12/21/21 1030)   diltiazem (CARDIZEM) infusion 15 mg/hr (12/21/21 0743)   doxycycline (VIBRAMYCIN) IV     PRN Meds:.sodium chloride, acetaminophen **OR** acetaminophen, ALPRAZolam, oxyCODONE, sodium chloride Medications Prior to Admission:  Prior to Admission medications   Medication Sig Start Date End Date Taking? Authorizing Provider  acetaminophen (TYLENOL) 325 MG tablet Take 2 tablets (650 mg total) by mouth every 6 (six) hours as needed for mild pain (or Fever >/= 101). Patient taking differently: Take 650 mg by mouth in the morning and at bedtime. 08/25/21  Yes Angiulli, Mcarthur Rossetti, PA-C  albuterol (PROVENTIL) (2.5 MG/3ML) 0.083%  nebulizer solution Inhale 3 mLs into the lungs every 4 (four) hours as needed for wheezing or shortness of breath. Patient taking differently: Inhale 3 mLs into the lungs See admin instructions. Nebulize 2.5 mg & inhale into the lungs 3-4 times a day and may also use an additional 2.5 mg up to two times a day as needed for shortness of breath 08/28/21  Yes Angiulli, Mcarthur Rossetti, PA-C  ALPRAZolam Prudy Feeler) 0.5 MG tablet Take 1 tablet (0.5 mg total) by mouth at bedtime as needed for anxiety. 11/26/21  Yes Glade Lloyd, MD  amiodarone (PACERONE) 200 MG tablet Take 1 tablet (200 mg total) by mouth daily. 11/26/21  Yes Glade Lloyd, MD  benzonatate (TESSALON) 200 MG capsule Take 1 capsule (200 mg total) by mouth 3 (three) times daily. Patient taking differently: Take 200 mg by mouth in the morning, at noon, and at bedtime. 10/07/21  Yes Heilingoetter, Cassandra L, PA-C  budesonide (PULMICORT) 0.5 MG/2ML nebulizer solution Take 2 mLs (0.5 mg total) by nebulization 2 (two) times daily. Patient taking differently: Take 0.5 mg by nebulization in the morning and at bedtime. 09/25/21  Yes Icard, Bradley L, DO  carbidopa-levodopa (SINEMET IR) 25-100 MG tablet Take 1 tablet by mouth See admin instructions. Take 1 tablet by mouth at 8 AM, 1 PM, 7 PM, and bedtime (10 PM) Patient taking differently: Take 1 tablet by mouth 4 (four) times daily. Take 1 tablet by mouth at 8 AM, 1 PM, 7 PM, and bedtime (10 PM) 11/26/21  Yes Glade Lloyd, MD  cefdinir (OMNICEF) 300 MG capsule Take 1 capsule (300 mg total) by mouth 2 (two) times daily. 12/18/21  Yes Heilingoetter, Cassandra L, PA-C  Dextromethorphan-Menthol (DELSYM COUGH RELIEF  MT) Use as directed 10 mLs in the mouth or throat at bedtime as needed (for cough).   Yes [provider]  diltiazem (CARDIZEM CD) 360 MG 24 hr capsule Take 1 capsule (360 mg total) by mouth daily. 11/27/21  Yes Glade Lloyd, MD  doxycycline (VIBRA-TABS) 100 MG tablet Take 1 tablet (100 mg total)  by mouth 2 (two) times daily. 12/18/21  Yes Heilingoetter, Cassandra L, PA-C  fenofibrate 160 MG tablet TAKE 1 TABLET DAILY. PLEASE KEEP UPCOMING APPT IN MARCH WITH DR. Elease Hashimoto BEFORE ANYMORE REFILLS. Patient taking differently: Take 160 mg by mouth See admin instructions. Take 160 mg by mouth at 2 PM daily 08/25/21  Yes Angiulli, Mcarthur Rossetti, PA-C  fluticasone (FLONASE) 50 MCG/ACT nasal spray Place 2 sprays into both nostrils daily. 08/25/21  Yes Angiulli, Mcarthur Rossetti, PA-C  folic acid (FOLVITE) 1 MG tablet Take 1 tablet (1 mg total) by mouth daily. 08/25/21  Yes Angiulli, Mcarthur Rossetti, PA-C  guaiFENesin (MUCINEX) 600 MG 12 hr tablet Take 2 tablets (1,200 mg total) by mouth 2 (two) times daily. 08/12/21  Yes Erick Blinks, MD  ipratropium (ATROVENT) 0.03 % nasal spray Place 1-2 sprays into both nostrils every 12 (twelve) hours. For drainage Patient taking differently: Place 2 sprays into both nostrils in the morning. 08/28/21  Yes Angiulli, Mcarthur Rossetti, PA-C  levalbuterol (XOPENEX) 0.63 MG/3ML nebulizer solution Take 3 mLs (0.63 mg total) by nebulization 2 (two) times daily. 09/25/21  Yes Icard, Rachel Bo, DO  levocetirizine (XYZAL) 5 MG tablet Take 1 tablet (5 mg total) by mouth every evening. 12/16/21  Yes Ellamae Sia, DO  loratadine (CLARITIN) 10 MG tablet Take 1 tablet (10 mg total) by mouth daily as needed for allergies (nasal drainage). Patient taking differently: Take 10 mg by mouth daily as needed for allergies. 08/25/21  Yes Angiulli, Mcarthur Rossetti, PA-C  magnesium oxide (MAG-OX) 400 (240 Mg) MG tablet Take 1 tablet (400 mg total) by mouth daily. 08/25/21  Yes Angiulli, Mcarthur Rossetti, PA-C  metoprolol succinate (TOPROL-XL) 25 MG 24 hr tablet Take 1 tablet (25 mg total) by mouth daily. 08/25/21  Yes Angiulli, Mcarthur Rossetti, PA-C  Multiple Vitamins-Minerals (MULTIVITAMINS THER. W/MINERALS) TABS Take 1 tablet by mouth daily with breakfast.   Yes [provider]  nitroGLYCERIN (NITROSTAT) 0.4 MG SL tablet Place 1  tablet (0.4 mg total) under the tongue every 5 (five) minutes as needed for chest pain. 08/25/21  Yes Angiulli, Mcarthur Rossetti, PA-C  omeprazole (PRILOSEC) 20 MG capsule Take 1 capsule (20 mg total) by mouth daily before breakfast. 11/26/21  Yes Glade Lloyd, MD  ondansetron (ZOFRAN) 8 MG tablet Take 8 mg by mouth every 8 (eight) hours as needed for nausea or vomiting.   Yes [provider]  polyethylene glycol (MIRALAX / GLYCOLAX) 17 g packet Take 17 g by mouth daily. 11/26/21  Yes Glade Lloyd, MD  potassium chloride (KLOR-CON) 10 MEQ tablet TAKE 1 TABLET BY MOUTH EVERY DAY Patient taking differently: Take 10 mEq by mouth See admin instructions. Take 10 mEq by mouth at 2 PM daily 07/16/21  Yes Nahser, Deloris Ping, MD  rivaroxaban (XARELTO) 20 MG TABS tablet Take 1 tablet (20 mg total) by mouth daily. Patient taking differently: Take 20 mg by mouth every evening. 08/25/21  Yes Angiulli, Mcarthur Rossetti, PA-C  Saccharomyces boulardii (PROBIOTIC) 250 MG CAPS Take 250 mg by mouth daily.   Yes [provider]  senna-docusate (SENOKOT-S) 8.6-50 MG tablet Take 1 tablet by mouth 2 (two)  times daily. 11/20/21  Yes Burnadette Pop, MD  sodium chloride (OCEAN) 0.65 % SOLN nasal spray Place 1 spray into both nostrils as needed for congestion.   Yes [provider]  tamsulosin (FLOMAX) 0.4 MG CAPS capsule Take 1 capsule (0.4 mg total) by mouth daily after supper. 08/25/21  Yes Angiulli, Mcarthur Rossetti, PA-C  Tiotropium Bromide-Olodaterol (STIOLTO RESPIMAT) 2.5-2.5 MCG/ACT AERS Inhale 2 puffs into the lungs daily. 09/25/21  Yes Icard, Bradley L, DO  topiramate (TOPAMAX) 50 MG tablet Take 1-2 tablets (50-100 mg total) by mouth See admin instructions. Take 50mg  by mouth in the morning, then 100mg  at night 11/26/21  Yes Alekh, Kshitiz, MD  Vitamin D, Ergocalciferol, (DRISDOL) 1.25 MG (50000 UNIT) CAPS capsule Take 1 capsule (50,000 Units total) by mouth every 7 (seven) days. Monday Patient taking differently:  Take 50,000 Units by mouth every Monday. 10/05/21  Yes Johney Maine, MD   Allergies  Allergen Reactions   Amoxicillin Anaphylaxis and Swelling    Tolerates Rocephin   Fosinopril Other (See Comments) and Cough    Muscle aches (Monopril)   Hydromorphone Nausea And Vomiting   Ampicillin Rash   Codeine Nausea Only and Other (See Comments)    Heavy amounts cause nausea and stomach upset   Rosuvastatin Other (See Comments)    Joint pain   Review of Systems + weakness.   Physical Exam Awake alert  Resting in bed Currently on 6 L nasal cannula, is on 4 L nasal cannula at home. Monitor noted, has A fib, irregular, HR in the one-teens.  Has some bilateral lower extremity edema Skin is warm and dry Appears with generalized weakness  Vital Signs: BP 121/82 (BP Location: Right Arm)   Pulse (!) 128   Temp 97.9 F (36.6 C) (Oral)   Resp (!) 28   Ht 6\' 1"  (1.854 m)   Wt 102.5 kg   SpO2 95%   BMI 29.82 kg/m  Pain Scale: 0-10   Pain Score: 0-No pain   SpO2: SpO2: 95 % O2 Device:SpO2: 95 % O2 Flow Rate: .O2 Flow Rate (L/min): 6 L/min  IO: Intake/output summary:  Intake/Output Summary (Last 24 hours) at 12/21/2021 1207 Last data filed at 12/21/2021 0803 Gross per 24 hour  Intake 706.1 ml  Output 1000 ml  Net -293.9 ml    LBM: Last BM Date : 12/18/21 Baseline Weight: Weight: 102.5 kg Most recent weight: Weight: 102.5 kg     Palliative Assessment/Data:   PPS 50%  Time In:  11 Time Out:  12 Time Total:  60  Greater than 50%  of this time was spent counseling and coordinating care related to the above assessment and plan.  Signed by: Rosalin Hawking, MD   Please contact Palliative Medicine Team phone at [phone] for questions and concerns.  For individual provider: See Loretha Stapler

## 2021-12-21 NOTE — Progress Notes (Signed)
[facility] [address] Collective [facility]) Hospitalized Hospice Patient ?  ?Corey [name] is a current hospice patient (admitted to hospice on 12/15/21); with a terminal diagnosis of NSCLC; who went to the hospital after becoming increasingly SOB. EMS was activated, hospice was notified and offered to visit in the home, but ultimately the decision was made to send him to the emergency room. [name] is admitted with acute on chronic hypoxic respiratory failure. This is a related hospital admission per Dr. Hollace Kinnier, [facility] MD. ? ?Patient is alert and oriented. Off BiPAP and on 6Lnc. Report received from RN. Patient has tolerated being up and maintained O2 sats on 5L. Bilateral Rhonchi. Ate lunch well. No complaints of pain. Family at bedside. ? ?He remains inpatient appropriate due to sepsis and need for IV fluids and antibiotics.  ? ?VS: 97.9, 117/73, 128, 24, 96% on 5Lnc ?I/O: 1538/200 ? ?Abnormal Labs: ?12/21/21 06:53 ?Sodium: 131 (L) ?CO2: 21 (L) ?Calcium: 8.4 (L) ?Alkaline Phosphatase: 36 (L) ?Albumin: 2.4 (L) ?Total Protein: 5.8 (L) ? ?Diagnostics: ?CXR ?IMPRESSION: ?No improvement from the Chest CT yesterday (please see that report). ?No new cardiopulmonary abnormality. ?CXR from 3/19 ?IMPRESSION: ?Complete opacification of the left hemithorax which may reflect ?superimposed airspace consolidation or atelectasis due to mucous ?plugging. ?New airspace opacification within the right lung base. ? ?IV/PRN medications:  Rocephin 2g IVPB QD, Cardizem Drip rate 5-28ml/hr 5-15mg /hr to maintain HR 65-100, currently at 15mg /hr, doxycycline 100mg  IVPB BID.  ? ?Problem list: ?Assessment and Plan: ?* Acute on chronic respiratory failure with hypoxia (HCC) ?In setting of multifocal pneumonia, known lung cancer and concern for mucous plugging. Patient uses 4 L/min of oxygen at baseline. Patient required BiPAP on admission and has been weaned to 5-6 L/min via [place]. ?-Wean to baseline as able ?  ?Multifocal  pneumonia ?Noted on CT imaging. No leukocytosis. Afebrile. Complicated by known lung cancer (last chemotherapy treatment in January 2023). Started empirically on Ceftriaxone IV, doxycycline IV and Vancomycin IV. MRSA PCR negative. Strep pneumoniae urine ag negative. RVP negative. Blood cultures pending. Patient is on amiodarone as an outpatient which complicates picture. Patient was started on Cefdinir and doxycycline as an outpatient for treatment and received a two days of treatment. ?-Discontinue Vancomycin ?-Continue Ceftriaxone and switch to doxycycline PO ?-Follow blood cultures and legionella ag ?-Await sputum sample for culture ?-Hold amiodarone ?  ?Paroxysmal atrial fibrillation with RVR ([place]) ?RVR in setting of lack of medication administration. Possibly contributed to by acute infection. Started on diltiazem drip. Toprol XL and amiodarone held secondary to NPO status on admission. ?-Continue diltiazem drip ?-Continue home Toprol XL ?-Hold amiodarone secondary to development of pulmonary infiltrates ?-Continue Xarelto ?-If cannot control, will consult cardiology ?  ?COPD (chronic obstructive pulmonary disease) ([place]) ?No wheezing noted on exam. Complicates current presentation. ?-Continue Pulmicort, Xopenex ?  ?Macrocytic anemia ?Baseline hemoglobin between 8-9. ?  ?Non-small cell carcinoma of left lung, stage 4 ([place]) ?Patient follows with Dr. [name] as an outpatient. Previously on chemotherapy but now has no further options for treatment. Enrolled in hospice but is currently DNR full scope. ?  ?Bilateral pleural effusion ?Noted on CT chest. Right much greater than left. Lasix given per PCCM. ?  ?Essential hypertension ?Normotensive. ?-Continue metoprolol ?  ?OSA on CPAP ?Patient started on BiPAP and is currently ordered QHS. ?  ?GERD (gastroesophageal reflux disease) ?-Resume PPI. Protonix while inpatient (substituted for Prilosec) ?  ?GOC: Pt is DNR, however full medical scope  ?D/C planning: ongoing,  return home  with hospice when stable for DC ?Family: present and updated ?IDT: hospice team updated ?  ?If transport should be needed at discharge, please use [facility] as they contract this service for our hospice patients. ?  ?Please do not hesitate to call with questions.   ?Thank you,   ?Farrel Gordon, RN, CCM      ?[facility] Liaison   ?[phone] ?

## 2021-12-21 NOTE — Assessment & Plan Note (Addendum)
Patient started on BiPAP and is currently ordered QHS. ?We will switch back to CPAP. ?

## 2021-12-21 NOTE — Assessment & Plan Note (Addendum)
RVR in setting of lack of respiratory distress. ?Cardiology consulted, management per cardiology. ?Currently on Cardizem drip, Toprol-XL, digoxin. ?Do not think that pulmonary infiltrate are secondary to amiodarone. ?Also on Xarelto. ?

## 2021-12-21 NOTE — Plan of Care (Signed)
  Problem: Elimination: Goal: Will not experience complications related to bowel motility Outcome: Progressing Goal: Will not experience complications related to urinary retention Outcome: Progressing   Problem: Pain Managment: Goal: General experience of comfort will improve Outcome: Progressing   

## 2021-12-21 NOTE — Assessment & Plan Note (Addendum)
Obstructive sleep apnea on CPAP. ?Multifocal healthcare associated pneumonia, ?known stage IV left lung cancer ?concern for mucous plugging. ?Patient uses 4 L/min of oxygen at baseline. Patient required BiPAP on admission and has been weaned to 5-6 L/min via [place] ?Continue to wean as able ?Currently using BiPAP nightly.  At home uses CPAP nightly. ?S/P thoracentesis for bilateral pleural effusion on 3/22. ?Will initiate low-dose 40 mg daily Lasix. ?Treated with antibiotics for 5 days. ?Continue flutter valve.  Continue supportive care. ?

## 2021-12-21 NOTE — Assessment & Plan Note (Addendum)
Slightly hypotensive overnight. Now improved. ?-Continue metoprolol ?

## 2021-12-22 DIAGNOSIS — I10 Essential (primary) hypertension: Secondary | ICD-10-CM | POA: Diagnosis not present

## 2021-12-22 DIAGNOSIS — J9621 Acute and chronic respiratory failure with hypoxia: Secondary | ICD-10-CM | POA: Diagnosis not present

## 2021-12-22 DIAGNOSIS — K5900 Constipation, unspecified: Secondary | ICD-10-CM

## 2021-12-22 DIAGNOSIS — Z7189 Other specified counseling: Secondary | ICD-10-CM | POA: Diagnosis not present

## 2021-12-22 DIAGNOSIS — J449 Chronic obstructive pulmonary disease, unspecified: Secondary | ICD-10-CM | POA: Diagnosis not present

## 2021-12-22 DIAGNOSIS — J90 Pleural effusion, not elsewhere classified: Secondary | ICD-10-CM | POA: Diagnosis not present

## 2021-12-22 DIAGNOSIS — C3492 Malignant neoplasm of unspecified part of left bronchus or lung: Secondary | ICD-10-CM | POA: Diagnosis not present

## 2021-12-22 DIAGNOSIS — J189 Pneumonia, unspecified organism: Secondary | ICD-10-CM | POA: Diagnosis not present

## 2021-12-22 LAB — EXPECTORATED SPUTUM ASSESSMENT W GRAM STAIN, RFLX TO RESP C

## 2021-12-22 LAB — CBC
HCT: 27.1 % — ABNORMAL LOW (ref 39.0–52.0)
Hemoglobin: 8.6 g/dL — ABNORMAL LOW (ref 13.0–17.0)
MCH: 32.7 pg (ref 26.0–34.0)
MCHC: 31.7 g/dL (ref 30.0–36.0)
MCV: 103 fL — ABNORMAL HIGH (ref 80.0–100.0)
Platelets: 319 10*3/uL (ref 150–400)
RBC: 2.63 MIL/uL — ABNORMAL LOW (ref 4.22–5.81)
RDW: 17.5 % — ABNORMAL HIGH (ref 11.5–15.5)
WBC: 7.9 10*3/uL (ref 4.0–10.5)
nRBC: 0.3 % — ABNORMAL HIGH (ref 0.0–0.2)

## 2021-12-22 LAB — CREATININE, SERUM
Creatinine, Ser: 0.78 mg/dL (ref 0.61–1.24)
GFR, Estimated: >60 mL/min (ref >60)

## 2021-12-22 LAB — LEGIONELLA PNEUMOPHILA SEROGP 1 UR AG: L. pneumophila Serogp 1 Ur Ag: NEGATIVE

## 2021-12-22 MED ORDER — FUROSEMIDE 10 MG/ML IJ SOLN: 40.0000 mg | Freq: Once | INTRAMUSCULAR | Status: DC

## 2021-12-22 MED ORDER — FUROSEMIDE 10 MG/ML IJ SOLN
40.0000 mg | Freq: Once | INTRAMUSCULAR | Status: AC
  Administered 2021-12-22: 40 mg via INTRAVENOUS
  Filled 2021-12-22: qty 4

## 2021-12-22 MED ORDER — BISACODYL 10 MG RE SUPP
10.0000 mg | Freq: Once | RECTAL | Status: DC
  Filled 2021-12-22: qty 1

## 2021-12-22 MED ORDER — POTASSIUM CHLORIDE 20 MEQ PO PACK: 20.0000 meq | PACK | Freq: Once | ORAL | Status: DC

## 2021-12-22 MED ORDER — POLYETHYLENE GLYCOL 3350 17 G PO PACK
17.0000 g | PACK | Freq: Two times a day (BID) | ORAL | Status: DC
  Administered 2021-12-23 – 2021-12-26 (×4): 17 g via ORAL
  Filled 2021-12-22 (×6): qty 1

## 2021-12-22 NOTE — Progress Notes (Signed)
? ?                                                                                                                                                     ?                                                   ?Daily Progress Note  ? ?Patient Name: Corey [name]       Date: 12/22/2021 ?DOB: 1946/05/02  Age: 76 y.o. MRN#: [id number] ?Attending Physician: Corey Aloe, MD ?Primary Care Physician: Corey Amel, MD ?Admit Date: 12/20/2021 ? ?Reason for Consultation/Follow-up: Establishing goals of care ? ?Subjective: ?I saw and examined Corey [name] today.  I know him from previous encounter. ? ?He was sitting in bed in no distress time of my encounter.  He had a friend, Corey [name], who he knows from church at the bedside with him. ? ?We discussed his clinical course and change in status at home that brought him into the hospital.  We discussed multiple comorbidities including underlying stage IV non-small cell lung cancer with metastasis, COPD, A-fib, pneumonia, obstructive sleep apnea, and concern for mucous plugging versus potential lymphangitic spread. ? ?He reports he is feeling much better today and we talked about plan to continue with current care with the hopes he can return home with hospice support in the next couple of days once his medical status is maximized. ? ?Length of Stay: 2 ? ?Current Medications: ?Scheduled Meds:  ?? acetylcysteine  2 mL Nebulization BID  ?? bisacodyl  10 mg Rectal Once  ?? budesonide  0.5 mg Nebulization BID  ?? carbidopa-levodopa  1 tablet Oral 4 times per day  ?? Chlorhexidine Gluconate Cloth  6 each Topical Daily  ?? doxycycline  100 mg Oral Q12H  ?? fenofibrate  160 mg Oral Q1400  ?? fluticasone  2 spray Each Nare Daily  ?? folic acid  1 mg Oral Daily  ?? guaiFENesin  1,200 mg Oral BID  ?? ipratropium  2 spray Each Nare Daily  ?? levalbuterol  0.63 mg Nebulization BID  ?? loratadine  10 mg Oral QPM  ?? magnesium oxide  400 mg Oral Daily  ?? metoprolol succinate  25 mg Oral Daily  ??  multivitamin with minerals  1 tablet Oral Daily  ?? pantoprazole  40 mg Oral Daily  ?? polyethylene glycol  17 g Oral BID  ?? potassium chloride  10 mEq Oral Q1400  ?? rivaroxaban  20 mg Oral Q supper  ?? senna-docusate  1 tablet Oral BID  ?? tamsulosin  0.4 mg Oral QPC supper  ?? topiramate  100 mg Oral QHS  ?? topiramate  50  mg Oral Daily  ? ? ?Continuous Infusions: ?? sodium chloride Stopped (12/21/21 1218)  ?? cefTRIAXone (ROCEPHIN)  IV Stopped (12/22/21 1022)  ?? diltiazem (CARDIZEM) infusion 15 mg/hr (12/22/21 1053)  ? ? ?PRN Meds: ?sodium chloride, acetaminophen **OR** acetaminophen, ALPRAZolam, metoprolol tartrate, oxyCODONE, prochlorperazine, sodium chloride ? ?Physical Exam      ?General: Alert, awake, in no acute distress.   ?Heart: Tachycardic, irregular  ?lungs: Good air movement, scattered rhonchi abdomen: Soft, nontender, nondistended, positive bowel sounds.   ?Ext: No significant edema ?Skin: Warm and dry ?Neuro: Grossly intact, nonfocal. ?    ? ?Vital Signs: BP (!) 104/58   Pulse (!) 133   Temp 98.5 ?F (36.9 ?C) (Oral)   Resp (!) 34   Ht _0  (1.854 m)   Wt 102.5 kg   SpO2 94%   BMI 29.82 kg/m?  ?SpO2: SpO2: 94 % ?O2 Device: O2 Device: High Flow Nasal Cannula ?O2 Flow Rate: O2 Flow Rate (L/min): 6 L/min ? ?Intake/output summary:  ?Intake/Output Summary (Last 24 hours) at 12/22/2021 1540 ?Last data filed at 12/22/2021 1448 ?Gross per 24 hour  ?Intake 779.45 ml  ?Output 4351 ml  ?Net -3571.55 ml  ? ?LBM: Last BM Date : 12/21/21 ?Baseline Weight: Weight: 102.5 kg ?Most recent weight: Weight: 102.5 kg ? ?     ?Palliative Assessment/Data: ? ? ? ? ? ?Patient Active Problem List  ? Diagnosis Date Noted  ?? Multifocal pneumonia 12/21/2021  ?? Acute on chronic respiratory failure with hypoxia ([place]) 12/20/2021  ?? Paroxysmal atrial fibrillation with RVR ([place]) 11/21/2021  ?? Macrocytic anemia 11/03/2021  ?? Bilateral pleural effusion 02/24/2021  ?? OSA on CPAP 03/27/2014  ?? COPD (chronic obstructive  pulmonary disease) ([place]) 11/21/2021  ?? Non-small cell carcinoma of left lung, stage 4 ([place]) 03/05/2021  ?? Essential hypertension 08/30/2016  ?? HLD (hyperlipidemia)   ?? GERD (gastroesophageal reflux disease)   ?? Constipation 12/22/2021  ?? Chronic respiratory failure with hypoxia 11/21/2021  ?? Pressure injury of skin 11/16/2021  ?? Dysarthria 11/10/2021  ?? Stasis dermatitis 11/06/2021  ?? Prolonged Q-T interval on ECG 11/04/2021  ?? Streptococcal sore throat   ?? Acute on chronic anemia   ?? Supplemental oxygen dependent   ?? Notalgia   ?? Adjustment disorder with mixed anxiety and depressed mood   ?? Physical deconditioning 08/12/2021  ?? Malaise 07/28/2021  ?? Port-A-Cath in place 04/27/2021  ?? Genetic testing 04/09/2021  ?? 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 CAP (community acquired pneumonia) 04/01/2021  ?? Family history of prostate cancer 03/20/2021  ?? Family history of breast cancer 03/20/2021  ?? Family history of lung cancer 03/20/2021  ?? Goals of care, counseling/discussion 03/18/2021  ?? Encounter for antineoplastic chemotherapy 03/18/2021  ?? Encounter for antineoplastic immunotherapy 03/18/2021  ?? Primary cancer of left lower lobe of lung ([place]) 03/07/2021  ?? Malnutrition of moderate degree 02/25/2021  ?? Hilar mass   ?? Reactive airway disease 11/20/2020  ?? Allergic rhinitis with non-allergic component 09/18/2020  ?? Heartburn 09/18/2020  ?? Malignant neoplasm of prostate ([place]) 03/04/2020  ?? Pre-syncope 02/27/2020  ?? Cerebral embolism with transient ischemic attack (TIA) 02/11/2020  ?? History of stroke 02/11/2020  ?? Embolic stroke involving cerebral artery ([place]) 02/11/2020  ?? Secondary hypercoagulable state ([place]) 09/10/2019  ?? Essential tremor 06/25/2019  ?? Morbid obesity ([place]) 05/27/2017  ?? Carotid artery disease ([place]) 08/30/2016  ?? Iron deficiency 01/05/2016  ?? Lumbar radiculopathy, chronic 02/04/2015  ?? SVT (supraventricular tachycardia) ([place]) 12/03/2014  ?? PAC  (  premature atrial contraction) 04/05/2014  ?? AF (paroxysmal atrial fibrillation) ([place]) 01/05/2013  ?? CVA (cerebral infarction) 07/06/2012  ?? Coronary artery disease   ?? Benign essential HTN   ?? Shortness of breath   ? ? ?Palliative Care Assessment & Plan  ? ?Patient Profile: ?76 year old male with multiple comorbidities who is currently on hospice with stage IV non-small cell lung cancer who is admitted for worsening respiratory status likely secondary to multifocal pneumonia with concern for mucous plugging. ? ?Recommendations/Plan: ?DNR/DNI ?Continue current care.  He was recently enrolled in home hospice prior to admission.  His goal is to treat potentially treatable conditions and get medically maximized to return home with hospice services. ?Palliative will continue to follow and progress conversation regarding goals of care based upon his continued clinical course ? ?Goals of Care and Additional Recommendations: ?Limitations on Scope of Treatment: Full Scope Treatment ? ?Code Status: ? ?  ?Code Status Orders  ?(From admission, onward)  ?  ? ? ?  ? ?  Start     Ordered  ? 12/20/21 1333  Do not attempt resuscitation (DNR)  Continuous       ?Question Answer Comment  ?In the event of cardiac or respiratory ARREST Do not call a ?code blue?   ?In the event of cardiac or respiratory ARREST Do not perform Intubation, CPR, defibrillation or ACLS   ?In the event of cardiac or respiratory ARREST Use medication by any route, position, wound care, and other measures to relive pain and suffering. May use oxygen, suction and manual treatment of airway obstruction as needed for comfort.   ?Comments confirmed w/ patient   ?  ? 12/20/21 1332  ? ?  ?  ? ?  ? ?Code Status History   ? ? Date Active Date Inactive Code Status Order ID Comments User Context  ? 11/21/2021 1626 11/26/2021 1701 Full Code [id number]  Jonnie Finner, DO ED  ? 11/11/2021 1255 11/21/2021 0200 DNR [id number]  Micheline Rough, MD Inpatient  ? 11/03/2021 1451  11/11/2021 1255 Full Code [id number]  Reubin Milan, MD ED  ? 11/03/2021 1447 11/03/2021 1450 Full Code [id number]  Reubin Milan, MD ED  ? 08/12/2021 1940 08/29/2021 1700 DNR [id number]  Romana Juniper, LPN Inpatient

## 2021-12-22 NOTE — Assessment & Plan Note (Signed)
-  Miralax BID ?-Dulcolax suppository x1 ?

## 2021-12-22 NOTE — Progress Notes (Signed)
Sent sputum sample to lab following chest physiotherapy. Lab called and stated that sample was not sufficient. Will try to get a better sample for the lab.  ?

## 2021-12-22 NOTE — Progress Notes (Signed)
[facility] [address] Collective [facility]) Hospitalized Hospice Patient ?  ?Corey [name] is a current hospice patient (admitted to hospice on 12/15/21); with a terminal diagnosis of NSCLC; who went to the hospital after becoming increasingly SOB. EMS was activated, hospice was notified and offered to visit in the home, but ultimately the decision was made to send him to the emergency room. Corey [name] is admitted with acute on chronic hypoxic respiratory failure. This is a related hospital admission per Dr. Hollace Kinnier, [facility] MD. ? ?He remains inpatient appropriate due to sepsis and need for IV fluids and antibiotics.  ? ?Patient alert and oriented, sitting up in bed. Wife at bedside. Received report from RN. Used BiPAP during the night and now on 6Lnc.  Reports feeling much better and hopeful to return home soon.  ?  ?VS: 1104/58, 118,  29,  94% on 6Lnc ?I/O: 1994.03/3650 ? ?Abnormal Labs: ?12/22/21 07:30 ?RBC: 2.63 (L) ?Hemoglobin: 8.6 (L) ?HCT: 27.1 (L) ?MCV: 103.0 (H) ?RDW: 17.5 (H) ?nRBC: 0.3 (H) ? ?IV/PRN medications:  Rocephin 2g IVPB QD, Cardizem Drip rate 5-73ml/hr 5-15mg /hr to maintain HR 65-100, currently at 15mg /hr, Lasix 40mg  IV QD ? ?Problem list:  ?Assessment and Plan: ?* Acute on chronic respiratory failure with hypoxia (HCC) ?In setting of multifocal pneumonia, known lung cancer and concern for mucous plugging. Patient uses 4 L/min of oxygen at baseline. Patient required BiPAP on admission and has been weaned to 5-6 L/min via [place]. ?-Wean to baseline as able ?  ?Multifocal pneumonia ?Noted on CT imaging. No leukocytosis. Afebrile. Complicated by known lung cancer (last chemotherapy treatment in January 2023). Started empirically on Ceftriaxone IV, doxycycline IV and Vancomycin IV. MRSA PCR negative. Strep pneumoniae urine ag negative. RVP negative. Blood cultures pending. Patient is on amiodarone as an outpatient which complicates picture. Patient was started on Cefdinir and doxycycline as an  outpatient for treatment and received a two days of treatment. Vancomycin discontinued ?-Continue Ceftriaxone and doxycycline PO ?-Follow blood cultures and legionella ag ?-Await sputum sample for culture, if able ?-Hold amiodarone ?  ?Paroxysmal atrial fibrillation with RVR ([place]) ?RVR in setting of lack of medication administration. Possibly contributed to by acute infection. Started on diltiazem drip. Toprol XL held secondary to NPO status on admission. Amiodarone holding because of pulmonary infiltrates. Heart rates are mildly elevated. ?-Continue diltiazem drip ?-Continue home Toprol XL ?-Hold amiodarone secondary to development of pulmonary infiltrates ?-Continue Xarelto ?-If cannot control, will consult cardiology ?  ?Macrocytic anemia ?Baseline hemoglobin between 8-9. ?  ?Bilateral pleural effusion ?Noted on CT chest. Right much greater than left.  ?-Lasix 40 mg IV x1 today ?  ?OSA on CPAP ?Patient started on BiPAP and is currently ordered QHS. ?  ?COPD (chronic obstructive pulmonary disease) ([place]) ?No wheezing noted on exam. Complicates current presentation. ?-Continue Pulmicort, Xopenex ?  ?Non-small cell carcinoma of left lung, stage 4 ([place]) ?Patient follows with Dr. [name] as an outpatient. Previously on chemotherapy but now has no further options for treatment. Enrolled in hospice but is currently DNR full scope. ?  ?Essential hypertension ?Slightly hypotensive overnight. Now improved. ?-Continue metoprolol ?  ?GERD (gastroesophageal reflux disease) ?-Continue Protonix while inpatient (substituted for Prilosec) ?  ?GOC: Pt is DNR, however full medical scope  ?D/C planning: ongoing, return home with hospice when stable for DC ?Family: present and updated ?IDT: hospice team updated ?  ?If transport should be needed at discharge, please use [facility] as they contract this service for our hospice patients. ?  ?  Please do not hesitate to call with questions.   ?Thank you,   ?Farrel Gordon, RN, CCM      ?[facility]  Hospital Liaison   ?[phone] ? ?

## 2021-12-22 NOTE — Progress Notes (Addendum)
? ?PROGRESS NOTE ? ? ? ?Corey [name]  TIR:[id number] DOB: 07/18/1946 DOA: 12/20/2021 ?PCP: [name], MD ? ? ?Brief Narrative: ?Corey [name] is a 76 y.o. male with a history of paroxysmal atrial fibrillation, stage IV NSCLC, chronic respiratory failure, COPD, hyperlipidemia, hypertension, GERD, CAD. Patient presented secondary to worsening dyspnea. On imaging, patient found to have signs concerning for multifocal pneumonia in addition to mucous plugging. Patient started on antibiotics and Mucomyst. PCCM consulted on admission. ? ? ?Assessment and Plan: ?* Acute on chronic respiratory failure with hypoxia (HCC) ?In setting of multifocal pneumonia, known lung cancer and concern for mucous plugging. Patient uses 4 L/min of oxygen at baseline. Patient required BiPAP on admission and has been weaned to 5-6 L/min via . ?-Wean to baseline as able ? ?Multifocal pneumonia ?Noted on CT imaging. No leukocytosis. Afebrile. Complicated by known lung cancer (last chemotherapy treatment in January 2023). Started empirically on Ceftriaxone IV, doxycycline IV and Vancomycin IV. MRSA PCR negative. Strep pneumoniae urine ag negative. RVP negative. Blood cultures pending. Patient is on amiodarone as an outpatient which complicates picture. Patient was started on Cefdinir and doxycycline as an outpatient for treatment and received a two days of treatment. Vancomycin discontinued ?-Continue Ceftriaxone and doxycycline PO ?-Follow blood cultures and legionella ag ?-Await sputum sample for culture, if able ?-Hold amiodarone ? ?Paroxysmal atrial fibrillation with RVR ([place]) ?RVR in setting of lack of medication administration. Possibly contributed to by acute infection. Started on diltiazem drip. Toprol XL held secondary to NPO status on admission. Amiodarone holding because of pulmonary infiltrates. Heart rates are mildly elevated. ?-Continue diltiazem drip ?-Continue home Toprol XL ?-Hold amiodarone secondary to development of  pulmonary infiltrates ?-Continue Xarelto ?-If cannot control, will consult cardiology ? ?Macrocytic anemia ?Baseline hemoglobin between 8-9. ? ?Bilateral pleural effusion ?Noted on CT chest. Right much greater than left.  ?-Lasix 40 mg IV x1 today ? ?OSA on CPAP ?Patient started on BiPAP and is currently ordered QHS. ? ?COPD (chronic obstructive pulmonary disease) ([place]) ?No wheezing noted on exam. Complicates current presentation. ?-Continue Pulmicort, Xopenex ? ?Non-small cell carcinoma of left lung, stage 4 ([place]) ?Patient follows with Dr. [name] as an outpatient. Previously on chemotherapy but now has no further options for treatment. Enrolled in hospice but is currently DNR full scope. ? ?Essential hypertension ?Slightly hypotensive overnight. Now improved. ?-Continue metoprolol ? ?GERD (gastroesophageal reflux disease) ?-Continue Protonix while inpatient (substituted for Prilosec) ? ? ? ?DVT prophylaxis: Xarelto ?Code Status:   Code Status: DNR ?Family Communication: None at bedside ?Disposition Plan: Discharge home with hospice once oxygen and heart rate is stabilized. PT/OT ordered for recommendations. ? ? ?Consultants:  ?PCCM/Pulmonology ? ?Procedures:  ?None ? ?Antimicrobials: ?Vancomycin ?Ceftriaxone ?Doxycycline  ? ? ?Subjective: ?Feeling better from a respiratory standpoint. No dyspnea this morning. Tolerated BiPAP overnight. Biggest concern today is lack of bowel movement. ? ?Objective: ?BP 118/70   Pulse (!) 108   Temp 98.1 ?F (36.7 ?C) (Oral)   Resp (!) 22   Ht 6\' 1"  (1.854 m)   Wt 102.5 kg   SpO2 93%   BMI 29.82 kg/m?  ? ?Examination: ? ?General exam: Appears calm and comfortable ?Respiratory system: Mostly clear to auscultation with good air movement and minimal rhonchi heard. Respiratory effort normal. ?Cardiovascular system: S1 & S2 heard, irregular rhythm, fast rate. No murmurs, rubs, gallops or clicks. ?Gastrointestinal system: Abdomen is nondistended, soft and nontender. No organomegaly  or masses felt. Normal bowel sounds heard. ?Central nervous system: Alert and  oriented. No focal neurological deficits. ?Musculoskeletal: 2+ pitting edema. No calf tenderness ?Skin: No cyanosis. No rashes ?Psychiatry: Judgement and insight appear normal. Mood & affect appropriate.   ? ? ?Data Reviewed: I have personally reviewed following labs and imaging studies ? ?CBC ?Lab Results  ?Component Value Date  ? WBC 7.9 12/22/2021  ? RBC 2.63 (L) 12/22/2021  ? HGB 8.6 (L) 12/22/2021  ? HCT 27.1 (L) 12/22/2021  ? MCV 103.0 (H) 12/22/2021  ? MCH 32.7 12/22/2021  ? PLT 319 12/22/2021  ? MCHC 31.7 12/22/2021  ? RDW 17.5 (H) 12/22/2021  ? LYMPHSABS 0.4 (L) 12/20/2021  ? MONOABS 0.8 12/20/2021  ? EOSABS 0.2 12/20/2021  ? BASOSABS 0.1 12/20/2021  ? ? ? ?Last metabolic panel ?Lab Results  ?Component Value Date  ? NA 131 (L) 12/21/2021  ? K 4.0 12/21/2021  ? CL 99 12/21/2021  ? CO2 21 (L) 12/21/2021  ? BUN 15 12/21/2021  ? CREATININE 0.78 12/22/2021  ? GLUCOSE 85 12/21/2021  ? GFRNONAA >60 12/22/2021  ? GFRAA 70 06/25/2020  ? CALCIUM 8.4 (L) 12/21/2021  ? PHOS 4.1 08/05/2021  ? PROT 5.8 (L) 12/21/2021  ? ALBUMIN 2.4 (L) 12/21/2021  ? LABGLOB 3.0 10/08/2020  ? BILITOT 0.7 12/21/2021  ? ALKPHOS 36 (L) 12/21/2021  ? AST 16 12/21/2021  ? ALT 10 12/21/2021  ? ANIONGAP 11 12/21/2021  ? ? ?GFR: ?Estimated Creatinine Clearance: 101.9 mL/min (by C-G formula based on SCr of 0.78 mg/dL). ? ?Recent Results (from the past 240 hour(s))  ?Blood Culture (routine x 2)     Status: None (Preliminary result)  ? Collection Time: 12/20/21  7:00 AM  ? Specimen: BLOOD  ?Result Value Ref Range Status  ? Specimen Description   Final  ?  BLOOD RIGHT ANTECUBITAL ?Performed at [facility], [place] [address] [place], [place] [address] ?  ? Special Requests   Final  ?  BOTTLES DRAWN AEROBIC AND ANAEROBIC Blood Culture adequate volume ?Performed at [facility], [place] [address], [place], [place] [address] ?  ? Culture    Final  ?  NO GROWTH 2 DAYS ?Performed at [facility], [place] [address], [place], [place] [address] ?  ? Report Status PENDING  Incomplete  ?Blood Culture (routine x 2)     Status: None (Preliminary result)  ? Collection Time: 12/20/21  7:00 AM  ? Specimen: BLOOD  ?Result Value Ref Range Status  ? Specimen Description   Final  ?  BLOOD LEFT ANTECUBITAL ?Performed at [facility], [place] [address] [place], [place] [address] ?  ? Special Requests   Final  ?  BOTTLES DRAWN AEROBIC AND ANAEROBIC Blood Culture results may not be optimal due to an excessive volume of blood received in culture bottles ?Performed at [facility], [place] [address], [place], [place] [address] ?  ? Culture   Final  ?  NO GROWTH 2 DAYS ?Performed at [facility], [place] [address] [place], [place] [address] ?  ? Report Status PENDING  Incomplete  ?Resp Panel by RT-PCR (Flu A&B, Covid) Nasopharyngeal Swab     Status: None  ? Collection Time: 12/20/21  7:17 AM  ? Specimen: Nasopharyngeal Swab; Nasopharyngeal(NP) swabs in vial transport medium  ?Result Value Ref Range Status  ? SARS Coronavirus 2 by RT PCR NEGATIVE NEGATIVE Final  ?  Comment: (NOTE) ?SARS-CoV-2 target nucleic acids are NOT DETECTED. ? ?The SARS-CoV-2 RNA is generally detectable in  upper respiratory ?specimens during the acute phase of infection. The lowest ?concentration of SARS-CoV-2 viral copies this assay can detect is ?138 copies/mL. A negative result does not preclude SARS-Cov-2 ?infection and should not be used as the sole basis for treatment or ?other patient management decisions. A negative result may occur with  ?improper specimen collection/handling, submission of specimen other ?than nasopharyngeal swab, presence of viral mutation(s) within the ?areas targeted by this assay, and inadequate number of viral ?copies(<138 copies/mL). A negative result must be combined with ?clinical observations, patient history, and  epidemiological ?information. The expected result is Negative. ? ?Fact Sheet for Patients:  ?EntrepreneurPulse.com.au ? ?Fact Sheet for Healthcare Providers:  ?IncredibleEmployment.be ? ?This test i

## 2021-12-22 NOTE — Progress Notes (Signed)
Patient requested to remove BiPAP mask.  He tolerated BiPAP all night very well.  Patient is on 6L [place] - O2 saturation is 95% ?

## 2021-12-22 NOTE — Progress Notes (Addendum)
? ?NAME:  Corey [name], MRN:  [id number], DOB:  07-01-1946, LOS: 2 ?ADMISSION DATE:  12/20/2021, CONSULTATION DATE:  12/20/2021 ?REFERRING MD: Dr. Eulis Foster CHIEF COMPLAINT: Respiratory failure ? ?History of Present Illness:  ?76 y/o M admitted 3/19 with complaints of worsening shortness of breath, productive cough. ? ?Breathing has been difficult the last few weeks, just became increasingly difficult in the last few days ?Has had multiple courses of antibiotics, spouse had recently called for a refill of antibiotics. ? ?History of lung cancer, palliative treatments has he had failed recent chemotherapy history ?Review of obstructive sleep apnea for which he uses CPAP ?Uses oxygen at home at about 4 L history ?Coronary artery disease, chronic obstructive pulmonary disease, atrial fibrillation ? ?He had received radiation therapy ?Chemotherapy was discontinued January 2023 ? ?Pertinent  Medical History  ?Non-Small Cell Lung Cancer - last chemo 10/2021, on hospice  ?Left Pleural Effusion  ?COPD  ?OSA on CPAP  ?Chronic Hyponatremia  ?HTN  ?CAD  ?AF - hx of RVR, previously on tikosyn ?CVA ?GERD  ?Parkinson's Disease  ?Prostate Cancer  ? ?Significant Hospital Events: ?Including procedures, antibiotic start and stop dates in addition to other pertinent events   ?3/19 Admit, CXR showing opacification of the left lung, CT Chest with opacification of the left lung, volume loss on the left which may be related to atelectasis, mucous plugging, evidence of pulmonary hypertension, mediastinal adenopathy.  He does have a right pleural effusion.  Bilateral multifocal infiltrates. On BiPAP  ?3/20 Wore bipap overnight, no acute events  ? ?Interim History / Subjective:  ?Pt reports feeling better. No issues with BiPAP overnight.  ?3.6L UOP, -1.6L in last 24 hours  ? ?Objective   ?Blood pressure 100/64, pulse 82, temperature 98.1 ?F (36.7 ?C), temperature source Oral, resp. rate 19, height 6\' 1"  (1.854 m), weight 102.5 kg, SpO2 97 %. ?    ?FiO2 (%):  [35 %] 35 %  ? ?Intake/Output Summary (Last 24 hours) at 12/22/2021 0736 ?Last data filed at 12/22/2021 0630 ?Gross per 24 hour  ?Intake 1487.73 ml  ?Output 3651 ml  ?Net -2163.27 ml  ? ?Filed Weights  ? 12/20/21 0631  ?Weight: 102.5 kg  ? ? ?Examination: ?General: adult male lying in bed in NAD ?HEENT: MM pink/moist, [place] O2, anicteric  ?Neuro: AAOx4, speech clear, MAE ?CV: s1s2 irr irr, no m/r/g ?PULM: non-labored at rest, rhonchi on right, diminished on left  ?GI: soft, bsx4 active  ?Extremities: warm/dry, trace edema  ?Skin: no rashes or lesions ? ?Resolved Hospital Problem list   ? ? ?Assessment & Plan:  ? ?Acute on Chronic Hypoxic Respiratory Failure ?Concern for Multilobar PNA with Mucus Plugging vs Cancer Progression  ?Strep antigen negative, no fevers or leukocytosis. RVP negative.  ?-D3/x abx ?-follow cultures  ?-continue pulmonary hygiene -flutter valve, mobilize out of bed  ?-follow intermittent CXR  ?-BiPAP QHS & PRN daytime sleep or WOB  ?-DNR  ?-mucomyst BID, consider stopping 3/22 ?-additional lasix 3/21 with KCL  ?-wean O2 for sats 88-95% ? ?Stage IV non-small cell lung cancer with metastasis ?Unfortunately this may be progressive and contributing to CT scan changes.  Failed chemotherapy, last dose 10/2021 ?-supportive care  ?-appreciate Palliative Care  ? ?Chronic obstructive pulmonary disease ?-pulmicort, xopenex nebs ? ?Atrial fibrillation with RVR ?-tele monitoring  ?-cardizem per primary  ? ?[place]  ?-DNR  ? ?Best Practice (right click and "Reselect all SmartList Selections" daily)  ?Diet/type: Regular consistency (see orders) ?DVT prophylaxis: DOAC ?GI prophylaxis: N/A ?Lines:  N/A ?Foley:  N/A ?Code Status:  DNR ?Last date of multidisciplinary goals of care discussion: per primary ? ?  ?  ?Noe Gens, MSN, APRN, NP-C, AGACNP-BC ?[facility] ?12/22/2021, 7:36 AM ? ? ?Please see [email or web address] for pager details.  ? ?From 7A-7P if no response, please call  [phone] ?After hours, please call Warren Lacy [phone] ? ? ? ? ?

## 2021-12-23 ENCOUNTER — Inpatient Hospital Stay (HOSPITAL_COMMUNITY)

## 2021-12-23 ENCOUNTER — Other Ambulatory Visit: Payer: Self-pay | Admitting: Hematology

## 2021-12-23 DIAGNOSIS — J9621 Acute and chronic respiratory failure with hypoxia: Secondary | ICD-10-CM | POA: Diagnosis not present

## 2021-12-23 DIAGNOSIS — I4891 Unspecified atrial fibrillation: Secondary | ICD-10-CM

## 2021-12-23 LAB — CBC
HCT: 26.1 % — ABNORMAL LOW (ref 39.0–52.0)
Hemoglobin: 8.4 g/dL — ABNORMAL LOW (ref 13.0–17.0)
MCH: 32.9 pg (ref 26.0–34.0)
MCHC: 32.2 g/dL (ref 30.0–36.0)
MCV: 102.4 fL — ABNORMAL HIGH (ref 80.0–100.0)
Platelets: 298 10*3/uL (ref 150–400)
RBC: 2.55 MIL/uL — ABNORMAL LOW (ref 4.22–5.81)
RDW: 17.2 % — ABNORMAL HIGH (ref 11.5–15.5)
WBC: 6.2 10*3/uL (ref 4.0–10.5)
nRBC: 0 % (ref 0.0–0.2)

## 2021-12-23 LAB — BASIC METABOLIC PANEL
Anion gap: 8 (ref 5–15)
BUN: 12 mg/dL (ref 8–23)
CO2: 28 mmol/L (ref 22–32)
Calcium: 8.3 mg/dL — ABNORMAL LOW (ref 8.9–10.3)
Chloride: 95 mmol/L — ABNORMAL LOW (ref 98–111)
Creatinine, Ser: 0.8 mg/dL (ref 0.61–1.24)
GFR, Estimated: >60 mL/min (ref >60)
Glucose, Bld: 108 mg/dL — ABNORMAL HIGH (ref 70–99)
Potassium: 3.1 mmol/L — ABNORMAL LOW (ref 3.5–5.1)
Sodium: 131 mmol/L — ABNORMAL LOW (ref 135–145)

## 2021-12-23 LAB — BODY FLUID CELL COUNT WITH DIFFERENTIAL
Eos, Fluid: 1 %
Lymphs, Fluid: 16 %
Monocyte-Macrophage-Serous Fluid: 62 % (ref 50–90)
Neutrophil Count, Fluid: 21 % (ref 0–25)
Total Nucleated Cell Count, Fluid: 81 cu mm (ref 0–1000)

## 2021-12-23 LAB — LACTATE DEHYDROGENASE, PLEURAL OR PERITONEAL FLUID: LD, Fluid: 94 U/L — ABNORMAL HIGH (ref 3–23)

## 2021-12-23 LAB — GLUCOSE, PLEURAL OR PERITONEAL FLUID: Glucose, Fluid: 112 mg/dL

## 2021-12-23 LAB — PROTEIN, PLEURAL OR PERITONEAL FLUID: Total protein, fluid: <3 g/dL

## 2021-12-23 MED ORDER — BENZONATATE 100 MG PO CAPS
200.0000 mg | ORAL_CAPSULE | Freq: Three times a day (TID) | ORAL | Status: DC
  Administered 2021-12-23 – 2021-12-26 (×10): 200 mg via ORAL
  Filled 2021-12-23 (×10): qty 2

## 2021-12-23 MED ORDER — POTASSIUM CHLORIDE CRYS ER 20 MEQ PO TBCR
40.0000 meq | EXTENDED_RELEASE_TABLET | ORAL | Status: AC
  Administered 2021-12-23 (×2): 40 meq via ORAL
  Filled 2021-12-23 (×2): qty 2

## 2021-12-23 MED ORDER — DIGOXIN 0.25 MG/ML IJ SOLN
0.2500 mg | Freq: Every day | INTRAMUSCULAR | Status: DC
  Administered 2021-12-25: 0.25 mg via INTRAVENOUS
  Filled 2021-12-23: qty 2

## 2021-12-23 MED ORDER — DIGOXIN 0.25 MG/ML IJ SOLN
0.2500 mg | Freq: Three times a day (TID) | INTRAMUSCULAR | Status: AC
  Administered 2021-12-23 – 2021-12-24 (×3): 0.25 mg via INTRAVENOUS
  Filled 2021-12-23 (×3): qty 2

## 2021-12-23 MED ORDER — HYDROCOD POLI-CHLORPHE POLI ER 10-8 MG/5ML PO SUER: 5.0000 mL | Freq: Every evening | ORAL | Status: DC | PRN

## 2021-12-23 NOTE — Procedures (Signed)
Thoracentesis  Procedure Note ? ?Corey [name]  ?[id number]  ?[birth year] ? ?Date:12/23/21  ?Time:10:34 AM  ? ?Provider Performing:Kaeleen Odom A Kelyn Ponciano  ? ?Procedure: Thoracentesis with imaging guidance (51460) ? ?Indication(s) ?Pleural Effusion ? ?Consent ?Risks of the procedure as well as the alternatives and risks of each were explained to the patient and/or caregiver.  Consent for the procedure was obtained and is signed in the bedside chart ? ?Anesthesia ?Topical only with 1% lidocaine  ? ? ?Time Out ?Verified patient identification, verified procedure, site/side was marked, verified correct patient position, special equipment/implants available, medications/allergies/relevant history reviewed, required imaging and test results available. ? ? ?Sterile Technique ?Maximal sterile technique including full sterile barrier drape, hand hygiene, sterile gloves, mask, hair covering, sterile ultrasound probe cover (if used). ? ?Procedure Description ?Ultrasound was used to identify appropriate pleural anatomy for placement and overlying skin marked.  Area of drainage cleaned and draped in sterile fashion. Lidocaine was used to anesthetize the skin and subcutaneous tissue.  850 cc's of amber colored appearing fluid was drained from the right pleural space. Catheter then removed and bandaid applied to site. ? ? ?Complications/Tolerance ?None; patient tolerated the procedure well. ?Chest X-ray is ordered to confirm no post-procedural complication. ? ? ?EBL ?none ? ? ?Specimen(s) ?Pleural fluid ? ? ? ? ? ? ? ? ? ? ? ?

## 2021-12-23 NOTE — Progress Notes (Signed)
OT Cancellation Note ? ?Patient Details ?Name: Corey [name] ?MRN: [id number] ?DOB: 1946/02/17 ? ? ?Cancelled Treatment:    Reason Eval/Treat Not Completed: Medical issues which prohibited therapy ?Patient noted to be in RED MEWs at this time with recent thoracentesis completed. OT to continue to follow and check back as schedule will allow.  ?Lariza Cothron OTR/L, MS ?Acute Rehabilitation Department ?Office# [phone] ?Pager# [phone] ? ?12/23/2021, 10:56 AM ?

## 2021-12-23 NOTE — Progress Notes (Signed)
[facility] [address] [facility]) Hospitalized Hospice Patient ?  ?[name] is a current hospice patient (admitted to hospice on 12/15/21); with a terminal diagnosis of NSCLC; who went to the hospital after becoming increasingly SOB. EMS was activated, hospice was notified and offered to visit in the home, but ultimately the decision was made to send him to the emergency room. Mr. Flagg is admitted with acute on chronic hypoxic respiratory failure. This is a related hospital admission per Dr. Hollace Kinnier, [facility] MD. ?  ?He remains inpatient appropriate due to increased skill level needed for ongoing assessment and management of sepsis and need for IV medications.  ?  ?Received report from RN- patient had over 800 ml removed via thoracentesis this morning and is breathing better and more alert. Per RN, Patient continues to have irregular elevated HR which needs continued assessment.  ?Visited patient who was sitting up in bed with brother at bedside. Patient was smiling and reported feeling ?perky and more comfortable' after procedure this am. He was hopeful that he may go home soon once they can get his HR under control. Patient made aware that [facility] will continue to follow him during this hospitalization and encouraged to call for support or concerns. ?  ?VS: 100/77, 133, 98.2,  22 5L O2 via HFNC ?I/O: 203.04/1199 (-996.3)  ?  ?Abnormal Labs:  ?RBC 2.55, Hemo 8.4, HCT 26.1, Na 131, K 3.1, Chl 95, Cal 8.3, Glucose 108, ?  ?IV/PRN medications: On 3/22 at 0800 - Cardizem Drip rate 50ml/hr (5-15mg /hr to maintain HR 65-100), currently at 15mg /hr, Xanax 0.5mg  at 2132 on 3/21 ?  ?Problem list: ? Acute on chronic hypoxic respiratory failure ?Concern for multilobar pneumonia with mucous plugging versus cancer progression ?-Day 4 of antibiotics ?-Cultures negative to date ?-Continue pulmonary hygiene, flutter valve, mobilize as tolerated ?-Continue BiPAP nightly ?-On Mucomyst, will consider stopping after  today's doses ?-Continue cautious diuresis ?  ?Right pleural effusion ?-Thoracentesis discussed with attendant risks ?-Agreeable to proceed with thoracentesis at present ?-I did review is CT scan of the chest today, reviewed multiple chest x-rays to assess for the feasibility of the thoracentesis ?-I do believe he at least has about 600 700 cc of fluid and this may help with his FRC ?  ?Stage IV non-small cell lung cancer with metastasis ?-Continue supportive care ?-chemotherapy was discontinued January 2023. ?  ?Chronic obstructive pulmonary disease ?-Continue Pulmicort, Xopenex ?  ?Atrial fibrillation with RVR ?-Telemetry monitoring ?-Cardizem for primary ? ?Discussed thoracentesis with patient at bedside ?He is agreeable to undergo thoracentesis ?Risk for thoracentesis discussed and patient agreeable, also discussed with his spouse over the phone ? ?  ?GOC: Pt is DNR, however full medical scope  ?D/C planning: ongoing, return home with hospice when stable for DC ?Family: present and updated ?IDT: hospice team updated ?  ?If transport should be needed at discharge, please use [facility] as they contract this service for our hospice patients. ?  ?Please do not hesitate to call with questions.   ?Thank you,   ? ?Gar Ponto, RN ?[facility] ?[phone] ? ?

## 2021-12-23 NOTE — Consult Note (Addendum)
?Cardiology Consultation:  ? ?Patient ID: Corey [name] ?MRN: [id number]; DOB: 09-13-1946 ? ?Admit date: 12/20/2021 ?Date of Consult: 12/23/2021 ? ?PCP:  [name], MD ?  ?[facility] Providers ?Cardiologist:  Mertie Moores, MD      ? ? ?Patient Profile:  ? ?Corey [name] is a 76 y.o. 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 Posey Pronto. ? ?History of Present Illness:  ? 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  Myoview in January 2021 was normal.  He has been diagnosed with stage IV non-small cell lung cancer since June 2022.  He required previous thoracentesis via pleural drain catheter by Dr. Valeta Harms on 03/17/2021.  Echocardiogram obtained on 08/04/2021 showed EF 65 to 70%, no regional wall motion abnormality, mild to moderately reduced RV systolic function, trivial MR, mild dilatation of the ascending aorta measuring at 40 mm.  Patient was admitted with shortness of breath late January and early February.  He had A-fib with RVR at the time.  Tikosyn stopped due to prolonged QT and hyponatremia.  There was also concern that Tikosyn directed with his cancer drug.  He was placed on amiodarone instead.  Digoxin was used briefly however discontinued prior to discharge.  He was discharged on 2/17 however returned on the following day due to his inability to set up home oxygen.  Heart rate remains elevated, Cardizem CD was increased to 360 mg daily to allow better rate  control.  Long-term toxicity of amiodarone was felt to be insignificant in light of the patient's poor prognosis.  Heart rate was in the 80s and 90s on combination of 360 mg daily of diltiazem ER, 25 mg daily of metoprolol succinate and 200 mg daily of amiodarone. ? ?Patient returned back to [facility] on 12/20/2021 due to worsening productive cough.  Patient was seen by oncology service a few days prior to arrival and was given antibiotic therapy for possible hospital-acquired pneumonia.  It was noted that patient has failed a recent palliative chemotherapy.  Chemotherapy discontinued in January 2023.  He received radiation therapy as well.  His breathing has been increasingly difficult. On arrival, sodium 128, creatinine 0.77, calcium 8.4, albumin 2.7, hemoglobin 9.3.  Viral panel was negative.  Urinalysis negative, negative urine culture.  Blood culture negative.  Chest x-ray showed a essentially whiteout of the left hemithorax possibly due to mucous plugging.  Pulmonology service consulted.  Venous Doppler negative for DVT.  Arterial blood gas showed pH 7.29, PCO2 50, PO2 98.  Amiodarone was held due to possible pulmonary infiltrate.  Heart rate has been increasing to the high 90s to 130 range on the heart monitor. ? ? ?Past Medical History:  ?Diagnosis Date  ? Anemia   ? Anxiety   ? Arthritis   ? "back, right knee, hands, ankles, neck" (05/31/2016)  ? Atrial fibrillation with RVR ([place]) 11/03/2021  ? Carotid artery disease ([place])   ?  Chronic lower back pain   ? COPD with acute exacerbation ([place]) 04/02/2021  ? Coronary atherosclerosis of native coronary artery   ? a. BMS to [facility] 2004 and 2007, otherwise mild nonobstructive disease. EF normal.  ? Diverticulitis   ? Dyslipidemia   ? Essential hypertension, benign   ? Family history of breast cancer   ? Family history of lung cancer   ? Family history of prostate cancer   ? GERD (gastroesophageal reflux disease)   ? Hyponatremia 06/04/2016  ? Lumbar  radiculopathy, chronic 02/04/2015  ? Right L5  ? Lung cancer ([place])   ? Mild dilation of ascending aorta ([place]) 08/2021  ? Obesity   ? OSA on CPAP   ? uses cpap  ? Parkinson's disease ([place])   ? Paroxysmal atrial fibrillation (HCC)   ? a. Discovered after stroke.  ? Pleural effusion on left 02/24/2021  ? Pneumonia 01/1996  ? PONV (postoperative nausea and vomiting)   ? Prostate CA [facility])   ? Stroke [facility]) 07/2012  ? no deficits  ? Symptomatic anemia   ? Visit for monitoring Tikosyn therapy 09/18/2019  ? ? ?Past Surgical History:  ?Procedure Laterality Date  ? ADENOIDECTOMY    ? ANTERIOR CERVICAL DECOMP/DISCECTOMY FUSION  07/2001; 10/2002  ? "C5-6; C6-7; redo"  ? BACK SURGERY    ? CARPAL TUNNEL RELEASE Left 10/2015  ? CHEST TUBE INSERTION Left 03/17/2021  ? Procedure: INSERTION PLEURAL DRAINAGE CATHETER;  Surgeon: Garner Nash, DO;  Location: [place] ENDOSCOPY;  Service: Pulmonary;  Laterality: Left;  Indwelling Tunneled Pleural catheter (PLEUREX)   ? COLONOSCOPY W/ POLYPECTOMY  02/2014  ? CORONARY ANGIOPLASTY WITH STENT PLACEMENT  05/2003; 12/2005  ? "mid RCA; mid RCA"  ? FINE NEEDLE ASPIRATION  02/26/2021  ? Procedure: FINE NEEDLE ASPIRATION (FNA) LINEAR;  Surgeon: Candee Furbish, MD;  Location: [facility] ENDOSCOPY;  Service: Pulmonary;;  ? HAND SURGERY  02/2019  ? LEFT HAND  ? implantable loop recorder placement  02/27/2020  ? [employer]onic Reveal [place] model [id number]  implantable loop recorder implanted by Dr Rayann Heman for afib management and evaluation of presyncope  ? IR IMAGING GUIDED PORT INSERTION  04/16/2021  ? JOINT REPLACEMENT    ? KNEE ARTHROSCOPY Left 10/2005  ? KNEE ARTHROSCOPY W/ PARTIAL MEDIAL MENISCECTOMY Left 09/2005  ? LUMBAR LAMINECTOMY/DECOMPRESSION MICRODISCECTOMY  03/2005  ? "L4-5"  ? POSTERIOR LUMBAR FUSION  10/2003  ? L5-S1; "plates, screws"  ? SHOULDER ARTHROSCOPY Right 08/2011  ? Debridement of labrum, arthroscopic distal clavicle excision  ? SHOULDER OPEN ROTATOR CUFF REPAIR Left 07/2014  ? TEE WITHOUT  CARDIOVERSION  07/07/2012  ? Procedure: TRANSESOPHAGEAL ECHOCARDIOGRAM (TEE);  Surgeon: Fay Records, MD;  Location: [place];  Service: Cardiovascular;  Laterality: N/A;  ? THORACENTESIS N/A 02/25/2021  ? Procedure: THORACENTESIS;  Surgeon: Juanito Doom, MD;  Location: [place];  Service: Cardiopulmonary;  Laterality: N/A;  ? TONSILLECTOMY AND ADENOIDECTOMY  ~ 1956  ? TOTAL KNEE ARTHROPLASTY Left 10/2006  ? TRIGGER FINGER RELEASE Left 10/2015  ? VIDEO BRONCHOSCOPY WITH ENDOBRONCHIAL ULTRASOUND Left 02/26/2021  ? Procedure: VIDEO BRONCHOSCOPY WITH ENDOBRONCHIAL ULTRASOUND;  Surgeon: Candee Furbish, MD;  Location: [facility] ENDOSCOPY;  Service: Pulmonary;  Laterality: Left;  cryoprobe too thanks!  ?  ? ?Home Medications:  ?Prior to Admission medications   ?Medication Sig Start Date End Date Taking? Authorizing Provider  ?acetaminophen (TYLENOL) 325 MG tablet Take 2 tablets (650 mg total) by mouth every 6 (six) hours as needed for mild pain (  or Fever >/= 101). ?Patient taking differently: Take 650 mg by mouth in the morning and at bedtime. 08/25/21  Yes Angiulli, Lavon Paganini, PA-C  ?albuterol (PROVENTIL) (2.5 MG/3ML) 0.083% nebulizer solution Inhale 3 mLs into the lungs every 4 (four) hours as needed for wheezing or shortness of breath. ?Patient taking differently: Inhale 3 mLs into the lungs See admin instructions. Nebulize 2.5 mg & inhale into the lungs 3-4 times a day and may also use an additional 2.5 mg up to two times a day as needed for shortness of breath 08/28/21  Yes Angiulli, Lavon Paganini, PA-C  ?ALPRAZolam (XANAX) 0.5 MG tablet Take 1 tablet (0.5 mg total) by mouth at bedtime as needed for anxiety. 11/26/21  Yes Aline August, MD  ?amiodarone (PACERONE) 200 MG tablet Take 1 tablet (200 mg total) by mouth daily. 11/26/21  Yes Aline August, MD  ?benzonatate (TESSALON) 200 MG capsule Take 1 capsule (200 mg total) by mouth 3 (three) times daily. ?Patient taking differently: Take 200 mg by mouth in the morning,  at noon, and at bedtime. 10/07/21  Yes Heilingoetter, Cassandra L, PA-C  ?budesonide (PULMICORT) 0.5 MG/2ML nebulizer solution Take 2 mLs (0.5 mg total) by nebulization 2 (two) times daily. ?Patient taking differently: Ta

## 2021-12-23 NOTE — Progress Notes (Signed)
?Progress Note ? ? ?Patient: Corey [name] DOB: [birth year] DOA: 12/20/2021     Hospitalization day: 3 ?DOS: the patient was seen and examined on 12/23/2021 ? ?Brief hospital course: ?[name] is a 76 y.o. male with a history of paroxysmal atrial fibrillation, stage IV NSCLC, chronic respiratory failure, COPD, hyperlipidemia, hypertension, GERD, CAD. Patient presented secondary to worsening dyspnea. On imaging, patient found to have signs concerning for multifocal pneumonia in addition to mucous plugging. Patient started on antibiotics and Mucomyst. PCCM consulted on admission. ?SP thoracentesis 3/22. ?Heart rate remains elevated.  Cardiology consulted.  Awaiting recommendation. ? ?Assessment and Plan: ?* Acute on chronic respiratory failure with hypoxia (HCC) ?In setting of multifocal pneumonia, known lung cancer and concern for mucous plugging. Patient uses 4 L/min of oxygen at baseline. Patient required BiPAP on admission and has been weaned to 5-6 L/min via [place] ?Continue to wean. ?SP thoracentesis for bilateral pleural effusion on 3/22. ?Appears to have multifocal pneumonia as primary presentation.  Currently being treated. ? ?Multifocal pneumonia ?Noted on CT imaging. No leukocytosis. Afebrile. Complicated by known lung cancer (last chemotherapy treatment in January 2023). Started empirically on Ceftriaxone IV, doxycycline IV and Vancomycin IV. MRSA PCR negative. Strep pneumoniae urine ag negative. RVP negative. Blood cultures pending. Patient is on amiodarone as an outpatient which complicates picture. Patient was started on Cefdinir and doxycycline as an outpatient for treatment and received a two days of treatment. Vancomycin discontinued ?-Continue Ceftriaxone and doxycycline PO ?-Follow blood cultures and legionella ag ?-Await sputum sample for culture, if able ? ?Paroxysmal atrial fibrillation with RVR ([place]) ?RVR in setting of lack of medication administration. Possibly contributed to by acute  infection. Started on diltiazem drip. Toprol XL held secondary to NPO status on admission. Cardiology consulted, management per cardiology. ?-Continue diltiazem drip ?-Continue home Toprol XL ?-Continue Xarelto ?Do not think that pulmonary infiltrate are secondary to amiodarone. ? ?COPD (chronic obstructive pulmonary disease) ([place]) ?No wheezing noted on exam. Complicates current presentation. ?-Continue Pulmicort, Xopenex ? ?Non-small cell carcinoma of left lung, stage 4 ([name]) ?Patient follows with Dr. [name] as an outpatient. Previously on chemotherapy but now has no further options for treatment. Enrolled in hospice but is currently DNR full scope. ? ?Essential hypertension ?Slightly hypotensive overnight. Now improved. ?-Continue metoprolol ? ?OSA on CPAP ?Patient started on BiPAP and is currently ordered QHS. ? ?Constipation ?-Miralax BID ?-Dulcolax suppository x1 ? ?Macrocytic anemia ?Baseline hemoglobin between 8-9.  Currently stable.  Monitor. ? ?GERD (gastroesophageal reflux disease) ?-Continue Protonix while inpatient (substituted for Prilosec) ? ? ?Subjective: No nausea no vomiting no fever no chills.  No chest pain abdominal pain.  Feeling better after the thoracentesis. ? ?Physical Exam: ?Vitals:  ? 12/23/21 1553 12/23/21 1600 12/23/21 1700 12/23/21 1800  ?BP:  102/65 100/68 99/78  ?Pulse:  (!) 131 (!) 133 (!) 131  ?Resp:  (!) 25 19 (!) 24  ?Temp: 98.8 ?F (37.1 ?C)     ?TempSrc: Oral     ?SpO2:  94% 100% 100%  ?Weight:      ?Height:      ? ?General: Appear in mild distress; no visible Abnormal Neck Mass Or lumps, Conjunctiva normal ?Cardiovascular: S1 and S2 Present, no Murmur, ?Respiratory: good respiratory effort, Bilateral Air entry present and faint Crackles, no wheezes ?Abdomen: Bowel Sound present, nontender ?Extremities: trace Pedal edema ?Neurology: alert and oriented to time, place, and person ?Gait not checked due to patient safety concerns  ? ?Data Reviewed: ?I have Reviewed nursing  notes,  Vitals, and Lab results since pt's last encounter. Pertinent lab results CBC and BMP ?I have ordered test including CBC and BMP ?I have discussed pt's care plan and test results with cardiology, pulmonary, palliative care.  ? ?Family Communication: Family at bedside ? ?Disposition: ?Status is: Inpatient ?Remains inpatient appropriate because: Still with RVR requiring IV therapy. ? ?Author: ?Berle Mull, MD ?12/23/2021 6:50 PM ? ?For on call review [email or web address] ?

## 2021-12-23 NOTE — Progress Notes (Signed)
Tolerated ultrasound-guided thoracentesis well-850 cc of fluid drained ? ?Scanned left side of the chest -no identifiable free-flowing fluid ?

## 2021-12-23 NOTE — Progress Notes (Addendum)
? ?NAME:  Corey [name], MRN:  [id number], DOB:  January 23, 1946, LOS: 3 ?ADMISSION DATE:  12/20/2021, CONSULTATION DATE:  12/20/2021 ?REFERRING MD: Dr. Eulis Foster CHIEF COMPLAINT: Respiratory failure ? ?History of Present Illness:  ?76 y/o M admitted 3/19 with complaints of worsening shortness of breath, productive cough. ? ?Breathing has been difficult the last few weeks, just became increasingly difficult in the last few days ?Has had multiple courses of antibiotics, spouse had recently called for a refill of antibiotics. ? ?History of lung cancer, palliative treatments has he had failed recent chemotherapy history ?Review of obstructive sleep apnea for which he uses CPAP ?Uses oxygen at home at about 4 L history ?Coronary artery disease, chronic obstructive pulmonary disease, atrial fibrillation ? ?He had received radiation therapy ?Chemotherapy was discontinued January 2023 ? ?Pertinent  Medical History  ?Non-Small Cell Lung Cancer - last chemo 10/2021, on hospice  ?Left Pleural Effusion  ?COPD  ?OSA on CPAP  ?Chronic Hyponatremia  ?HTN  ?CAD  ?AF - hx of RVR, previously on tikosyn ?CVA ?GERD  ?Parkinson's Disease  ?Prostate Cancer  ? ?Significant Hospital Events: ?Including procedures, antibiotic start and stop dates in addition to other pertinent events   ?3/19 Admit, CXR showing opacification of the left lung, CT Chest with opacification of the left lung, volume loss on the left which may be related to atelectasis, mucous plugging, evidence of pulmonary hypertension, mediastinal adenopathy.  He does have a right pleural effusion.  Bilateral multifocal infiltrates. On BiPAP  ?3/20 Wore bipap overnight, no acute events  ?3/22-chest x-ray still with atelectasis ? ?Interim History / Subjective:  ?Pt reports feeling better. No issues with BiPAP overnight.  ?Appears to be diuresing well, negative fluid balance ? ?Objective   ?Blood pressure 100/69, pulse (!) 130, temperature 98.6 ?F (37 ?C), temperature source Axillary, resp.  rate (!) 21, height 6\' 1"  (1.854 m), weight 102.5 kg, SpO2 99 %. ?   ?FiO2 (%):  [35 %] 35 %  ? ?Intake/Output Summary (Last 24 hours) at 12/23/2021 0908 ?Last data filed at 12/23/2021 0800 ?Gross per 24 hour  ?Intake 480.41 ml  ?Output 2400 ml  ?Net -1919.59 ml  ? ?Filed Weights  ? 12/20/21 0631  ?Weight: 102.5 kg  ? ? ?Examination: ?General: Appears comfortable ?HEENT: Moist oral mucosa ?Neuro: Alert and oriented x3, nonfocal exam ?CV: S1-S2 appreciated with no murmur ?PULM: Decreased breath sounds on the left,Rhonchi in the right ?GI: soft, bsx4 active  ?Extremities: warm/dry, trace edema  ?Skin: no rashes or lesions ? ?Resolved Hospital Problem list   ? ? ?Assessment & Plan:  ? ?Acute on chronic hypoxic respiratory failure ?Concern for multilobar pneumonia with mucous plugging versus cancer progression ?-Day 4 of antibiotics ?-Cultures negative to date ?-Continue pulmonary hygiene, flutter valve, mobilize as tolerated ?-Continue BiPAP nightly ?-On Mucomyst, will consider stopping after today's doses ?-Continue cautious diuresis ? ?Right pleural effusion ?-Thoracentesis discussed with attendant risks ?-Agreeable to proceed with thoracentesis at present ?-I did review is CT scan of the chest today, reviewed multiple chest x-rays to assess for the feasibility of the thoracentesis ?-I do believe he at least has about 600 700 cc of fluid and this may help with his FRC ? ?Stage IV non-small cell lung cancer with metastasis ?-Continue supportive care ?-chemotherapy was discontinued January 2023. ? ?Chronic obstructive pulmonary disease ?-Continue Pulmicort, Xopenex ? ?Atrial fibrillation with RVR ?-Telemetry monitoring ?-Cardizem for primary ? ? ? ?[place]  ?-DNR  ? ?Discussed thoracentesis with patient at bedside ?He is  agreeable to undergo thoracentesis ?Risk for thoracentesis discussed and patient agreeable, also discussed with his spouse over the phone ? ?Best Practice (right click and "Reselect all SmartList Selections"  daily)  ?Diet/type: Regular consistency (see orders) ?DVT prophylaxis: DOAC ?GI prophylaxis: N/A ?Lines: N/A ?Foley:  N/A ?Code Status:  DNR ?Last date of multidisciplinary goals of care discussion: per primary ? ?Sherrilyn Rist, MD ?[place] [facility] ?Pager: See Amion ? ? ? ? ? ?

## 2021-12-23 NOTE — Telephone Encounter (Signed)
Patient's wife is aware of advisement.  ?

## 2021-12-23 NOTE — Progress Notes (Signed)
? ?                                                                                                                                                     ?                                                   ?Daily Progress Note  ? ?Patient Name: Corey [name]       Date: 12/23/2021 ?DOB: [birth year]  Age: 76 y.o. MRN#: [id number] ?Attending Physician: Lavina Hamman, MD ?Primary Care Physician: [name], MD ?Admit Date: 12/20/2021 ? ?Reason for Consultation/Follow-up: Establishing goals of care ? ?Subjective: ?I saw and examined Corey [name] today.  His wife and a Theme park manager from their church were at the bedside as well. ? ?He reports feeling much better after having thoracentesis today for 850 mL. ? ?We talked through his multiple comorbidities and his goal of returning home with hospice support.  They understand that he has incurable illness with his cancer but want to continue to add as much time and quality to his life as possible.  We also discussed potential concern for recurrent pneumonias and working in conjunction with hospice to evaluate the best way to focus on quality if this continues to be an issue moving forward. ? ?Discussed continued goal to get his heart rate under control with guidance from cardiology and then hopefully begin to discuss plan for timing of return home. ? ?Length of Stay: 3 ? ?Current Medications: ?Scheduled Meds:  ?? acetylcysteine  2 mL Nebulization BID  ?? benzonatate  200 mg Oral TID  ?? bisacodyl  10 mg Rectal Once  ?? budesonide  0.5 mg Nebulization BID  ?? carbidopa-levodopa  1 tablet Oral 4 times per day  ?? Chlorhexidine Gluconate Cloth  6 each Topical Daily  ?? digoxin  0.25 mg Intravenous Q8H  ? Followed by  ?? [START ON 12/25/2021] digoxin  0.25 mg Intravenous Daily  ?? doxycycline  100 mg Oral Q12H  ?? fenofibrate  160 mg Oral Q1400  ?? fluticasone  2 spray Each Nare Daily  ?? folic acid  1 mg Oral Daily  ?? guaiFENesin  1,200 mg Oral BID  ?? ipratropium  2 spray Each Nare Daily   ?? levalbuterol  0.63 mg Nebulization BID  ?? loratadine  10 mg Oral QPM  ?? magnesium oxide  400 mg Oral Daily  ?? metoprolol succinate  25 mg Oral Daily  ?? multivitamin with minerals  1 tablet Oral Daily  ?? pantoprazole  40 mg Oral Daily  ?? polyethylene glycol  17 g Oral BID  ?? potassium chloride  10 mEq Oral Q1400  ??  rivaroxaban  20 mg Oral Q supper  ?? senna-docusate  1 tablet Oral BID  ?? tamsulosin  0.4 mg Oral QPC supper  ?? topiramate  100 mg Oral QHS  ?? topiramate  50 mg Oral Daily  ? ? ?Continuous Infusions: ?? sodium chloride Stopped (12/21/21 1218)  ?? cefTRIAXone (ROCEPHIN)  IV Stopped (12/23/21 1124)  ?? diltiazem (CARDIZEM) infusion 15 mg/hr (12/23/21 1600)  ? ? ?PRN Meds: ?sodium chloride, acetaminophen **OR** acetaminophen, ALPRAZolam, chlorpheniramine-HYDROcodone, metoprolol tartrate, oxyCODONE, prochlorperazine, sodium chloride ? ?Physical Exam      ?General: Alert, awake, in no acute distress.   ?Heart: Tachycardic, irregular  ?lungs: Good air movement, scattered rhonchi abdomen: Soft, nontender, nondistended, positive bowel sounds.   ?Ext: No significant edema ?Skin: Warm and dry ?Neuro: Grossly intact, nonfocal. ?    ? ?Vital Signs: BP 102/65   Pulse (!) 131   Temp 98.8 ?F (37.1 ?C) (Oral)   Resp (!) 25   Ht 6' 1"  (1.854 m)   Wt 102.5 kg   SpO2 94%   BMI 29.82 kg/m?  ?SpO2: SpO2: 94 % ?O2 Device: O2 Device: High Flow Nasal Cannula (Simultaneous filing. User may not have seen previous data.) ?O2 Flow Rate: O2 Flow Rate (L/min): 5 L/min (Simultaneous filing. User may not have seen previous data.) ? ?Intake/output summary:  ?Intake/Output Summary (Last 24 hours) at 12/23/2021 1732 ?Last data filed at 12/23/2021 1600 ?Gross per 24 hour  ?Intake 535.17 ml  ?Output 1900 ml  ?Net -1364.83 ml  ? ? ?LBM: Last BM Date : 12/22/21 ?Baseline Weight: Weight: 102.5 kg ?Most recent weight: Weight: 102.5 kg ? ?     ?Palliative Assessment/Data: ? ? ? ? ? ?Patient Active Problem List  ? Diagnosis Date  Noted  ?? Paroxysmal atrial fibrillation with RVR ([place]) 11/21/2021  ?? Benign essential HTN   ?? Macrocytic anemia 11/03/2021  ?? OSA on CPAP 03/27/2014  ?? Constipation 12/22/2021  ?? COPD (chronic obstructive pulmonary disease) ([place]) 11/21/2021  ?? Non-small cell carcinoma of left lung, stage 4 ([place]) 03/05/2021  ?? Essential hypertension 08/30/2016  ?? HLD (hyperlipidemia)   ?? GERD (gastroesophageal reflux disease)   ?? Multifocal pneumonia 12/21/2021  ?? Acute on chronic respiratory failure with hypoxia ([place]) 12/20/2021  ?? Bilateral pleural effusion 02/24/2021  ?? Chronic respiratory failure with hypoxia 11/21/2021  ?? Pressure injury of skin 11/16/2021  ?? Dysarthria 11/10/2021  ?? Stasis dermatitis 11/06/2021  ?? Prolonged Q-T interval on ECG 11/04/2021  ?? Streptococcal sore throat   ?? Acute on chronic anemia   ?? Supplemental oxygen dependent   ?? Notalgia   ?? Adjustment disorder with mixed anxiety and depressed mood   ?? Physical deconditioning 08/12/2021  ?? Malaise 07/28/2021  ?? Port-A-Cath in place 04/27/2021  ?? Genetic testing 04/09/2021  ?? Monoallelic mutation of ATM [place] 04/09/2021  ?? CAP (community acquired pneumonia) 04/01/2021  ?? Family history of prostate cancer 03/20/2021  ?? Family history of breast cancer 03/20/2021  ?? Family history of lung cancer 03/20/2021  ?? Goals of care, counseling/discussion 03/18/2021  ?? Encounter for antineoplastic chemotherapy 03/18/2021  ?? Encounter for antineoplastic immunotherapy 03/18/2021  ?? Primary cancer of left lower lobe of lung ([place]) 03/07/2021  ?? Malnutrition of moderate degree 02/25/2021  ?? Hilar mass   ?? Reactive airway disease 11/20/2020  ?? Allergic rhinitis with non-allergic component 09/18/2020  ?? Heartburn 09/18/2020  ?? Malignant neoplasm of prostate ([place]) 03/04/2020  ?? Pre-syncope 02/27/2020  ?? Cerebral embolism with transient ischemic attack (TIA) 02/11/2020  ??  History of stroke 02/11/2020  ?? Embolic stroke involving cerebral  artery ([place]) 02/11/2020  ?? Secondary hypercoagulable state ([place]) 09/10/2019  ?? Essential tremor 06/25/2019  ?? Morbid obesity ([place]) 05/27/2017  ?? Carotid artery disease ([place]) 08/30/2016  ?? Iron deficiency 01/05/2016  ?? Lumbar radiculopathy, chronic 02/04/2015  ?? SVT (supraventricular tachycardia) ([place]) 12/03/2014  ?? PAC (premature atrial contraction) 04/05/2014  ?? AF (paroxysmal atrial fibrillation) ([place]) 01/05/2013  ?? CVA (cerebral infarction) 07/06/2012  ?? Coronary artery disease   ?? Shortness of breath   ? ? ?Palliative Care Assessment & Plan  ? ?Patient Profile: ?76 year old male with multiple comorbidities who is currently on hospice with stage IV non-small cell lung cancer who is admitted for worsening respiratory status likely secondary to multifocal pneumonia with concern for mucous plugging. ? ?Recommendations/Plan: ?DNR/DNI ?Continue current care.  He was recently enrolled in home hospice prior to admission.  His goal is to treat potentially treatable conditions and get medically maximized to return home with hospice services. ?Palliative will continue to follow and progress conversation regarding goals of care based upon his continued clinical course ? ?Goals of Care and Additional Recommendations: ?Limitations on Scope of Treatment: Full Scope Treatment ? ?Code Status: ? ?  ?Code Status Orders  ?(From admission, onward)  ?  ? ? ?  ? ?  Start     Ordered  ? 12/20/21 1333  Do not attempt resuscitation (DNR)  Continuous       ?Question Answer Comment  ?In the event of cardiac or respiratory ARREST Do not call a ?code blue?   ?In the event of cardiac or respiratory ARREST Do not perform Intubation, CPR, defibrillation or ACLS   ?In the event of cardiac or respiratory ARREST Use medication by any route, position, wound care, and other measures to relive pain and suffering. May use oxygen, suction and manual treatment of airway obstruction as needed for comfort.   ?Comments confirmed w/ patient   ?   ? 12/20/21 1332  ? ?  ?  ? ?  ? ?Code Status History   ? ? Date Active Date Inactive Code Status Order ID Comments User Context  ? 11/21/2021 1626 11/26/2021 1701 Full Code [id number]  Jonnie Finner, DO ED

## 2021-12-24 LAB — BASIC METABOLIC PANEL
Anion gap: 6 (ref 5–15)
BUN: 12 mg/dL (ref 8–23)
CO2: 27 mmol/L (ref 22–32)
Calcium: 8.4 mg/dL — ABNORMAL LOW (ref 8.9–10.3)
Chloride: 98 mmol/L (ref 98–111)
Creatinine, Ser: 0.76 mg/dL (ref 0.61–1.24)
GFR, Estimated: >60 mL/min (ref >60)
Glucose, Bld: 110 mg/dL — ABNORMAL HIGH (ref 70–99)
Potassium: 4.1 mmol/L (ref 3.5–5.1)
Sodium: 131 mmol/L — ABNORMAL LOW (ref 135–145)

## 2021-12-24 LAB — CBC
HCT: 26.6 % — ABNORMAL LOW (ref 39.0–52.0)
Hemoglobin: 8.5 g/dL — ABNORMAL LOW (ref 13.0–17.0)
MCH: 33.2 pg (ref 26.0–34.0)
MCHC: 32 g/dL (ref 30.0–36.0)
MCV: 103.9 fL — ABNORMAL HIGH (ref 80.0–100.0)
Platelets: 297 10*3/uL (ref 150–400)
RBC: 2.56 MIL/uL — ABNORMAL LOW (ref 4.22–5.81)
RDW: 17.1 % — ABNORMAL HIGH (ref 11.5–15.5)
WBC: 7.3 10*3/uL (ref 4.0–10.5)
nRBC: 0 % (ref 0.0–0.2)

## 2021-12-24 LAB — MAGNESIUM: Magnesium: 1.8 mg/dL (ref 1.7–2.4)

## 2021-12-24 LAB — CYTOLOGY - NON PAP

## 2021-12-24 MED ORDER — DOXYCYCLINE HYCLATE 100 MG PO TABS
100.0000 mg | ORAL_TABLET | Freq: Two times a day (BID) | ORAL | Status: AC
  Administered 2021-12-24: 100 mg via ORAL
  Filled 2021-12-24: qty 1

## 2021-12-24 MED ORDER — AMIODARONE HCL 200 MG PO TABS
200.0000 mg | ORAL_TABLET | Freq: Every day | ORAL | Status: DC
  Administered 2021-12-25 – 2021-12-26 (×2): 200 mg via ORAL
  Filled 2021-12-24 (×2): qty 1

## 2021-12-24 NOTE — Assessment & Plan Note (Addendum)
3/24 discussion with patient as well as wife. ?Educated wife with regards to patient's current condition and future prognosis and how to manage his symptoms down the road to maintain comfort. ?Explained how focusing on oxygen and antibiotic use will only prolong the suffering for the patient. ?I discussed with the patient with regards to his current condition in the setting of recurrent pneumonia with stage IV lung cancer without any therapy options and ongoing respiratory distress. ?I have explained that his shortness of breath, cough and other symptoms will most likely be part of his life and probably will get worse. ?I have explained that in the setting of his ongoing respiratory distress his A-fib will remain poorly controlled which will also contribute to his ongoing distress. ?There is not a lot of room to further uptitrate his cardiac medication. ?Patient is already enrolled in hospice. ?My concern is that the patient's prognosis is very poor to limited to up to 3 to 6 months or even less.  At request of the patient, I have shared this information with him. ?At present my recommendation would be to focus on symptom control, get him as stable as possible and consider transition to home with hospice as soon as possible. ?Initiate scheduled morphine nightly for his respiratory distress and back pain. ?Patient and wife agree.  Will likely plan for discharging home with hospice on 3/25. ?

## 2021-12-24 NOTE — Evaluation (Signed)
Physical Therapy Evaluation ?Patient Details ?Name: Corey [name] ?MRN: [id number] ?DOB: 1946/09/05 ?Today's Date: 12/24/2021 ? ?History of Present Illness ? Pt. is a 76 y.o. male presenting to [facility] on 3/19 with worsening dyspnea. Imaging shows patient to have multifocal pneumonia in addition to mucous plugging. PMH significant for  paroxysmal a-fib, stage IV NSCLC, chronic respiratory failure, COPD, hyperlipidemia, hypertension, GERD, CAD.  ?Clinical Impression ? Pt presents with decreased functional mobility, endurance, balance, strength, and gait ability. These impairments are limiting his ability to safely and independently transfer, get into his home, perform all adls/iadls, and ambulate in the community. Pt to benefit from acute PT to address deficits. Bed mobility, sitting balance, STS transfer, and step pivot transfer performed with RW. Pt. Responded well but was limited secondary to fatigue, strength, and balance. Pt. Plans to go home on hospice support once medically stable for d/c, no PT follow reccomended . Pt. Requesting PT to progress mobility as tolerated so he can maintain functional level. Pt will continue to follow acutely.  ?   ?   ? ?Recommendations for follow up therapy are one component of a multi-disciplinary discharge planning process, led by the attending physician.  Recommendations may be updated based on patient status, additional functional criteria and insurance authorization. ? ?Follow Up Recommendations Other (comment) ([place]) ? ?  ?Assistance Recommended at Discharge Intermittent Supervision/Assistance  ?Patient can return home with the following ? A little help with walking and/or transfers;Assistance with cooking/housework;Direct supervision/assist for medications management;Assist for transportation;A little help with bathing/dressing/bathroom;Help with stairs or ramp for entrance ? ?  ?Equipment Recommendations None recommended by PT  ?Recommendations for Other Services ?     ?  ?Functional Status Assessment Patient has had a recent decline in their functional status and demonstrates the ability to make significant improvements in function in a reasonable and predictable amount of time.  ? ?  ?Precautions / Restrictions Precautions ?Precautions: Fall ?Restrictions ?Weight Bearing Restrictions: No  ? ?  ? ?Mobility ? Bed Mobility ?Overal bed mobility: Needs Assistance ?Bed Mobility: Supine to Sit ?  ?  ?Supine to sit: Min guard ?  ?  ?General bed mobility comments: Min guard for saftey ?  ? ?Transfers ?Overall transfer level: Needs assistance ?Equipment used: Rolling walker (2 wheels) ?Transfers: Sit to/from Stand, Bed to chair/wheelchair/BSC ?Sit to Stand: Min assist ?  ?Step pivot transfers: Min guard ?  ?  ?  ?General transfer comment: Min A for walker stability during power up, min guard for transfer for saftey. Pt. reports increased fatigue afterwards ?  ? ?Ambulation/Gait ?  ?  ?  ?  ?  ?  ?  ?  ? ?Stairs ?  ?  ?  ?  ?  ? ?Wheelchair Mobility ?  ? ?Modified Rankin (Stroke Patients Only) ?  ? ?  ? ?Balance Overall balance assessment: Needs assistance ?Sitting-balance support: Feet supported ?Sitting balance-Leahy Scale: Fair ?Sitting balance - Comments: Pt. able to reach minimally out of BOS and look over shoulders all while maintaining balance. He would not be able to tolerate moderate ballance challenges ?  ?Standing balance support: Bilateral upper extremity supported, During functional activity ?Standing balance-Leahy Scale: Poor ?Standing balance comment: Requires UE support for all standing ?  ?  ?  ?  ?  ?  ?  ?  ?  ?  ?  ?   ? ? ? ?Pertinent Vitals/Pain Pain Assessment ?Pain Assessment: No/denies pain  ? ? ?Home Living Family/patient expects  to be discharged to:: Private residence ?Living Arrangements: Spouse/significant other ?Available Help at Discharge: Family;Available PRN/intermittently;Friend(s);Neighbor ?Type of Home: House ?Home Access: Stairs to enter ?Entrance  Stairs-Rails: None ?Entrance Stairs-Number of Steps: 2+2 ?Alternate Level Stairs-Number of Steps: flight ?Home Layout: Two level;Bed/bath upstairs;1/2 bath on main level ?Home Equipment: Rollator (4 wheels);Cane - single point;Shower Land (2 wheels);Hand held shower head;Transport chair;Adaptive equipment ?   ?  ?Prior Function Prior Level of Function : Needs assist ?  ?  ?  ?  ?  ?  ?Mobility Comments: pivot to/from w/c ?ADLs Comments: Independent with ADLs, bathing at the sink ?  ? ? ?Hand Dominance  ? Dominant Hand: Right ? ?  ?Extremity/Trunk Assessment  ? Upper Extremity Assessment ?Upper Extremity Assessment: Defer to OT evaluation ?  ? ?Lower Extremity Assessment ?Lower Extremity Assessment: Generalized weakness ?  ? ?Cervical / Trunk Assessment ?Cervical / Trunk Assessment: Normal  ?Communication  ? Communication: No difficulties  ?Cognition Arousal/Alertness: Awake/alert ?Behavior During Therapy: [facility] for tasks assessed/performed ?Overall Cognitive Status: No family/caregiver present to determine baseline cognitive functioning ?  ?  ?  ?  ?  ?  ?  ?  ?  ?  ?  ?  ?  ?  ?  ?  ?  ?  ?  ? ?  ?General Comments General comments (skin integrity, edema, etc.): Breif dip of SpO2 in mid-low 80s after transfer and quickly recovers with seated rest. ? ?  ?Exercises    ? ?Assessment/Plan  ?  ?PT Assessment Patient needs continued PT services  ?PT Problem List Decreased strength;Decreased mobility;Decreased range of motion;Decreased coordination;Decreased activity tolerance;Decreased balance;Cardiopulmonary status limiting activity ? ?   ?  ?PT Treatment Interventions DME instruction;Therapeutic activities;Gait training;Therapeutic exercise;Patient/family education;Stair training;Balance training;Functional mobility training;Neuromuscular re-education   ? ?PT Goals (Current goals can be found in the Care Plan section)  ?Acute Rehab PT Goals ?Patient Stated Goal: To maintain functional ability. ?PT Goal  Formulation: With patient ?Time For Goal Achievement: 01/07/22 ?Potential to Achieve Goals: Good ? ?  ?Frequency Min 3X/week ?  ? ? ?Co-evaluation PT/OT/SLP Co-Evaluation/Treatment: Yes ?Reason for Co-Treatment: Complexity of the patient's impairments (multi-system involvement) ?PT goals addressed during session: Mobility/safety with mobility;Balance;Proper use of DME ?OT goals addressed during session: ADL's and self-care;Proper use of Adaptive equipment and DME ?  ? ? ?  ?AM-PAC PT "6 Clicks" Mobility  ?Outcome Measure Help needed turning from your back to your side while in a flat bed without using bedrails?: A Little ?Help needed moving from lying on your back to sitting on the side of a flat bed without using bedrails?: A Little ?Help needed moving to and from a bed to a chair (including a wheelchair)?: A Little ?Help needed standing up from a chair using your arms (e.g., wheelchair or bedside chair)?: A Little ?Help needed to walk in hospital room?: A Little ?Help needed climbing 3-5 steps with a railing? : A Lot ?6 Click Score: 17 ? ?  ?End of Session Equipment Utilized During Treatment: Gait belt ?Activity Tolerance: Patient limited by fatigue ?Patient left: in chair;with call bell/phone within reach ?Nurse Communication: Mobility status ?PT Visit Diagnosis: Unsteadiness on feet (R26.81);Muscle weakness (generalized) (M62.81);Other abnormalities of gait and mobility (R26.89) ?  ? ?Time: 1610-9604 ?PT Time Calculation (min) (ACUTE ONLY): 24 min ? ? ?Charges:   PT Evaluation ?$PT Eval Low Complexity: 1 Low ?  ?  ?   ? ? ?Thermon Leyland, SPT ?Acute Rehab Services ? ? ?Larkin Ina  Phillip Heal ?12/24/2021, 12:18 PM ? ?

## 2021-12-24 NOTE — Progress Notes (Signed)
? ?                                                                                                                                                     ?                                                   ?Daily Progress Note  ? ?Patient Name: Corey [name]       Date: 12/24/2021 ?DOB: [birth year]  Age: 76 y.o. MRN#: [id number] ?Attending Physician: Lavina Hamman, MD ?Primary Care Physician: [name], MD ?Admit Date: 12/20/2021 ? ?Reason for Consultation/Follow-up: Establishing goals of care ? ?Subjective: ?I saw and examined [name] today.  His wife was at the bedside as well. ? ?He reports that his breathing remains better today and overall he has been feeling better each day.  He is encouraged by the fact that this heart rate is better controlled today.   ? ?We reviewed conversation that he had with Dr. Posey Pronto this morning and he relays that he understands the severity of his condition and becomes tearful when discussing prognosis. ? ?Discussed focusing on feeling as well as he can and living each day as well as possible.  He expressed again finding strength in his faith. ? ?Length of Stay: 4 ? ?Current Medications: ?Scheduled Meds:  ?? acetylcysteine  2 mL Nebulization BID  ?? benzonatate  200 mg Oral TID  ?? bisacodyl  10 mg Rectal Once  ?? budesonide  0.5 mg Nebulization BID  ?? carbidopa-levodopa  1 tablet Oral 4 times per day  ?? Chlorhexidine Gluconate Cloth  6 each Topical Daily  ?? [START ON 12/25/2021] digoxin  0.25 mg Intravenous Daily  ?? doxycycline  100 mg Oral Q12H  ?? fenofibrate  160 mg Oral Q1400  ?? fluticasone  2 spray Each Nare Daily  ?? folic acid  1 mg Oral Daily  ?? guaiFENesin  1,200 mg Oral BID  ?? ipratropium  2 spray Each Nare Daily  ?? levalbuterol  0.63 mg Nebulization BID  ?? loratadine  10 mg Oral QPM  ?? magnesium oxide  400 mg Oral Daily  ?? metoprolol succinate  25 mg Oral Daily  ?? multivitamin with minerals  1 tablet Oral Daily  ?? pantoprazole  40 mg Oral Daily  ??  polyethylene glycol  17 g Oral BID  ?? potassium chloride  10 mEq Oral Q1400  ?? rivaroxaban  20 mg Oral Q supper  ?? senna-docusate  1 tablet Oral BID  ?? tamsulosin  0.4 mg Oral QPC supper  ?? topiramate  100 mg Oral QHS  ?? topiramate  50 mg Oral Daily  ? ? ?  Continuous Infusions: ?? sodium chloride Stopped (12/21/21 1218)  ?? diltiazem (CARDIZEM) infusion 15 mg/hr (12/24/21 1200)  ? ? ?PRN Meds: ?sodium chloride, acetaminophen **OR** acetaminophen, ALPRAZolam, chlorpheniramine-HYDROcodone, metoprolol tartrate, oxyCODONE, prochlorperazine, sodium chloride ? ?Physical Exam      ?General: Alert, awake, in no acute distress.   ?Heart: Tachycardic, irregular  ?lungs: Good air movement, scattered rhonchi abdomen: Soft, nontender, nondistended, positive bowel sounds.   ?Ext: No significant edema ?Skin: Warm and dry ?Neuro: Grossly intact, nonfocal. ?    ? ?Vital Signs: BP 131/61   Pulse (!) 108   Temp 98.7 ?F (37.1 ?C) (Oral)   Resp (!) 22   Ht _0  (1.854 m)   Wt 102.5 kg   SpO2 99%   BMI 29.82 kg/m?  ?SpO2: SpO2: 99 % ?O2 Device: O2 Device: High Flow Nasal Cannula ?O2 Flow Rate: O2 Flow Rate (L/min): 4 L/min ? ?Intake/output summary:  ?Intake/Output Summary (Last 24 hours) at 12/24/2021 1515 ?Last data filed at 12/24/2021 1200 ?Gross per 24 hour  ?Intake 454.77 ml  ?Output 1500 ml  ?Net -1045.23 ml  ? ? ?LBM: Last BM Date : 12/22/21 ?Baseline Weight: Weight: 102.5 kg ?Most recent weight: Weight: 102.5 kg ? ?     ?Palliative Assessment/Data: ? ? ? ? ? ?Patient Active Problem List  ? Diagnosis Date Noted  ?? Multifocal pneumonia 12/21/2021  ?? Acute on chronic respiratory failure with hypoxia ([place]) 12/20/2021  ?? Bilateral pleural effusion 02/24/2021  ?? COPD (chronic obstructive pulmonary disease) ([place]) 11/21/2021  ?? Paroxysmal atrial fibrillation with RVR ([place]) 11/21/2021  ?? Non-small cell carcinoma of left lung, stage 4 ([place]) 03/05/2021  ?? Essential hypertension 08/30/2016  ?? OSA on CPAP 03/27/2014  ?? HLD  (hyperlipidemia)   ?? Constipation 12/22/2021  ?? Macrocytic anemia 11/03/2021  ?? GERD (gastroesophageal reflux disease)   ?? Chronic respiratory failure with hypoxia 11/21/2021  ?? Pressure injury of skin 11/16/2021  ?? Dysarthria 11/10/2021  ?? Stasis dermatitis 11/06/2021  ?? Prolonged Q-T interval on ECG 11/04/2021  ?? Streptococcal sore throat   ?? Acute on chronic anemia   ?? Supplemental oxygen dependent   ?? Notalgia   ?? Adjustment disorder with mixed anxiety and depressed mood   ?? Physical deconditioning 08/12/2021  ?? Malaise 07/28/2021  ?? Port-A-Cath in place 04/27/2021  ?? Genetic testing 04/09/2021  ?? Monoallelic mutation of ATM Cailan Antonucci 04/09/2021  ?? CAP (community acquired pneumonia) 04/01/2021  ?? Family history of prostate cancer 03/20/2021  ?? Family history of breast cancer 03/20/2021  ?? Family history of lung cancer 03/20/2021  ?? Goals of care, counseling/discussion 03/18/2021  ?? Encounter for antineoplastic chemotherapy 03/18/2021  ?? Encounter for antineoplastic immunotherapy 03/18/2021  ?? Primary cancer of left lower lobe of lung ([place]) 03/07/2021  ?? Malnutrition of moderate degree 02/25/2021  ?? Hilar mass   ?? Reactive airway disease 11/20/2020  ?? Allergic rhinitis with non-allergic component 09/18/2020  ?? Heartburn 09/18/2020  ?? Malignant neoplasm of prostate ([place]) 03/04/2020  ?? Pre-syncope 02/27/2020  ?? Cerebral embolism with transient ischemic attack (TIA) 02/11/2020  ?? History of stroke 02/11/2020  ?? Embolic stroke involving cerebral artery ([place]) 02/11/2020  ?? Secondary hypercoagulable state ([place]) 09/10/2019  ?? Essential tremor 06/25/2019  ?? Morbid obesity ([place]) 05/27/2017  ?? Carotid artery disease ([place]) 08/30/2016  ?? Iron deficiency 01/05/2016  ?? Lumbar radiculopathy, chronic 02/04/2015  ?? SVT (supraventricular tachycardia) ([place]) 12/03/2014  ?? PAC (premature atrial contraction) 04/05/2014  ?? AF (paroxysmal atrial fibrillation) ([place]) 01/05/2013  ?? CVA (  cerebral  infarction) 07/06/2012  ?? Coronary artery disease   ?? Shortness of breath   ? ? ?Palliative Care Assessment & Plan  ? ?Patient Profile: ?76 year old male with multiple comorbidities who is currently on hospice with stage IV non-small cell lung cancer who is admitted for worsening respiratory status likely secondary to multifocal pneumonia with concern for mucous plugging. ? ?Recommendations/Plan: ?DNR/DNI ?Continue current care.  He was recently enrolled in home hospice prior to admission.  His goal is to treat potentially treatable conditions and get medically maximized to return home with hospice services. ? ?Goals of Care and Additional Recommendations: ?Limitations on Scope of Treatment: Full Scope Treatment ? ?Code Status: ? ?  ?Code Status Orders  ?(From admission, onward)  ?  ? ? ?  ? ?  Start     Ordered  ? 12/20/21 1333  Do not attempt resuscitation (DNR)  Continuous       ?Question Answer Comment  ?In the event of cardiac or respiratory ARREST Do not call a ?code blue?   ?In the event of cardiac or respiratory ARREST Do not perform Intubation, CPR, defibrillation or ACLS   ?In the event of cardiac or respiratory ARREST Use medication by any route, position, wound care, and other measures to relive pain and suffering. May use oxygen, suction and manual treatment of airway obstruction as needed for comfort.   ?Comments confirmed w/ patient   ?  ? 12/20/21 1332  ? ?  ?  ? ?  ? ?Code Status History   ? ? Date Active Date Inactive Code Status Order ID Comments User Context  ? 11/21/2021 1626 11/26/2021 1701 Full Code [id number]  Jonnie Finner, DO ED  ? 11/11/2021 1255 11/21/2021 0200 DNR [id number]  Micheline Rough, MD Inpatient  ? 11/03/2021 1451 11/11/2021 1255 Full Code [id number]  Reubin Milan, MD ED  ? 11/03/2021 1447 11/03/2021 1450 Full Code [id number]  Reubin Milan, MD ED  ? 08/12/2021 1940 08/29/2021 1700 DNR [id number]  Romana Juniper, LPN Inpatient  ? 08/12/2021 1638 08/12/2021 1940 Full Code  [id number]  Cathlyn Parsons, PA-C Inpatient  ? 08/12/2021 1638 08/12/2021 1638 DNR [id number]  Cathlyn Parsons, PA-C Inpatient  ? 07/29/2021 0820 08/12/2021 1635 DNR [id number]  Jonnie Finner, DO Inpatient  ? 07/29/2021 9532

## 2021-12-24 NOTE — Progress Notes (Signed)
?Progress Note ? ? ?Patient: Corey [name] MOL:[id number] DOB: June 02, 1946 DOA: 12/20/2021     Hospitalization day: 4 ?DOS: the patient was seen and examined on 12/24/2021 ? ?Brief hospital course: ?SOMA LIZAK is a 76 y.o. male with a history of paroxysmal atrial fibrillation, stage IV NSCLC, chronic respiratory failure, COPD, hyperlipidemia, hypertension, GERD, CAD. Patient presented secondary to worsening dyspnea. On imaging, patient found to have signs concerning for multifocal pneumonia in addition to mucous plugging. Patient started on antibiotics and Mucomyst. PCCM consulted on admission. ?SP thoracentesis 3/22. ?Heart rate remains elevated.  Cardiology consulted.  Currently on digoxin. ? ?Assessment and Plan: ?* Acute on chronic respiratory failure with hypoxia (HCC) ?Obstructive sleep apnea on CPAP. ?Multifocal healthcare associated pneumonia, ?known stage IV left lung cancer ?concern for mucous plugging. ?Patient uses 4 L/min of oxygen at baseline. Patient required BiPAP on admission and has been weaned to 5-6 L/min via [place] ?Continue to wean as able ?Currently using BiPAP nightly.  At home uses CPAP nightly. ?S/P thoracentesis for bilateral pleural effusion on 3/22. ?Treated with antibiotics for 5 days. ?Continue flutter valve.  Continue supportive care. ? ?Multifocal pneumonia ?Started empirically on Ceftriaxone IV, doxycycline IV and Vancomycin IV. ?MRSA PCR negative. Strep pneumoniae urine ag negative. RVP negative. Blood cultures also so far no growth. ?Patient was started on Cefdinir and doxycycline as an outpatient for treatment and received a two days of treatment.  ?Vancomycin discontinued.  ?I do not think that the amiodarone is responsible for the infiltrates seen on the CT scan. ?5 days antibiotic course completed on 3/23. ? ?Paroxysmal atrial fibrillation with RVR ([place]) ?RVR in setting of lack of respiratory distress. ?Cardiology consulted, management per cardiology. ?Currently on Cardizem drip,  Toprol-XL, digoxin. ?Do not think that pulmonary infiltrate are secondary to amiodarone. ?Also on Xarelto. ? ?COPD (chronic obstructive pulmonary disease) ([place]) ?No wheezing noted on exam. Complicates current presentation. ?-Continue Pulmicort, Xopenex ? ?Non-small cell carcinoma of left lung, stage 4 ([place]) ?Patient follows with Dr. [name] as an outpatient. Previously on chemotherapy but now has no further options for treatment.  ?Enrolled in hospice. ? ?Essential hypertension ?Slightly hypotensive overnight. Now improved. ?-Continue metoprolol ? ?OSA on CPAP ?Patient started on BiPAP and is currently ordered QHS. ?We will switch back to CPAP. ? ?Constipation ?-Miralax BID ?-Dulcolax suppository x1 ? ?Macrocytic anemia ?Baseline hemoglobin between 8-9.  Currently stable.  Monitor. ? ?GERD (gastroesophageal reflux disease) ?-Continue Protonix while inpatient (substituted for Prilosec) ? ?Goals of care, counseling/discussion ?I discussed with the patient with regards to his current condition in the setting of recurrent pneumonia with stage IV lung cancer without any therapy options and ongoing respiratory distress. ?I have explained that his shortness of breath, cough and other symptoms will most likely be part of his life and probably will get worse. ?I have explained that in the setting of his ongoing respiratory distress his A-fib will remain poorly controlled which will also contribute to his ongoing distress. ?There is not a lot of room to further uptitrate his cardiac medication. ?Patient is already enrolled in hospice. ?My concern is that the patient's prognosis is very poor to limited to up to 3 to 6 months or even less.  At request of the patient, I have shared this information with him. ?At present my recommendation would be to focus on symptom control, get him as stable as possible and consider transition to home with hospice as soon as possible. ? ?Subjective: Ongoing concern with cough, shortness of  breath as well as fatigue and tiredness.  No nausea no vomiting.  No pain reported. ? ?Physical Exam: ?Vitals:  ? 12/24/21 1000 12/24/21 1100 12/24/21 1200 12/24/21 1205  ?BP: (!) 132/91 117/67 131/61   ?Pulse: (!) 124 94 (!) 108   ?Resp: (!) 21 15 (!) 22   ?Temp:    98.7 ?F (37.1 ?C)  ?TempSrc:    Oral  ?SpO2: (!) 88% 98% 99%   ?Weight:      ?Height:      ? ?General: Appear in mild distress; no visible Abnormal Neck Mass Or lumps, Conjunctiva normal ?Cardiovascular: S1 and S2 Present, no Murmur, ?Respiratory: increased respiratory effort, Bilateral Air entry present and  left sided Crackles, Occasional  wheezes ?Abdomen: Bowel Sound present, non tender ?Extremities: bilateral  Pedal edema ?Neurology: alert and oriented to time, place, and person ?Gait not checked due to patient safety concerns  ? ?Data Reviewed: ?I have Reviewed nursing notes, Vitals, and Lab results since pt's last encounter. Pertinent lab results CBC and BMP ?I have ordered test including CBC and BMP ?I have discussed pt's care plan and test results with cardiology.  ? ?Family Communication: None at bedside.  Patient would like me to discuss this prognosis with his wife. ? ?Disposition: ?Status is: Inpatient ?Remains inpatient appropriate because: Ongoing issues with rate control. ? ?Author: ?Berle Mull, MD ?12/24/2021 3:16 PM ? ?For on call review [email or web address]. ?

## 2021-12-24 NOTE — Progress Notes (Signed)
Met with Corey [name] who was being visited by his twin brother Corey [name].  Corey [name] was sitting  up in bedside chair and able to talk with Chaplain and brother well.  Corey [name] expressed his emotions regarding his "stage Im in and ive decided to go home with Hospice." As he stated this - he shed tears and wept for several minutes.  Chaplain provided space for him and his brother to share their faith journey - their home life as children growing up in [place], and their families details.  Corey [name] spoke lovingly of histhree chidlren and four grand children.  He and his brother spoke of their Father Corey [name], Sr. Who was "a big military man who taught us the value of being strong."  When Chaplain asked what would  their Father say to Corey [name] if her were here with him today in the hosptial room:  patient replied " He'd tell me how prous he was of me and how he loved me." Corey [name] wept for 4 minutes as he described how he learned from his Father life lessons.   ?Chaplain inquired what advice would like  to give his four grand children.  He stated with wide eyes and excitement in his voice: "Id' tell them to let God direct them their whole lives!"   ?Brother Corey [name] provided emotional support demonstrated love towards's the patient as well.   ?Hospice Chaplain came to introduce himself and as patient's request we all prayed together.   ? ? ? 12/24/21 1100  ?Clinical Encounter Type  ?Visited With Patient and family together  ?Visit Type Initial  ?Referral From Nurse;Chaplain  ?Consult/Referral To Chaplain  ?Spiritual Encounters  ?Spiritual Needs Sacred text;Prayer;Emotional;Grief support  ?Stress Factors  ?Patient Stress Factors Family relationships;Loss of control;Major life changes  ?Family Stress Factors Family relationships;Major life changes  ? ? ?

## 2021-12-24 NOTE — Progress Notes (Signed)
PCCM will sign off ? ?Call as needed ?

## 2021-12-24 NOTE — Progress Notes (Addendum)
[facility] [address] [facility]) Hospitalized Hospice Patient ?  ?Corey [name] is a current hospice patient (admitted to hospice on 12/15/21); with a terminal diagnosis of NSCLC; who went to the hospital after becoming increasingly SOB. EMS was activated, hospice was notified and offered to visit in the home, but ultimately the decision was made to send him to the emergency room. Mr. Bays is admitted with acute on chronic hypoxic respiratory failure. This is a related hospital admission per Dr. Hollace Kinnier, [facility] MD. ? ?He remains inpatient appropriate due to increased skill level needed for ongoing assessment and management of sepsis and need for IV medications.  ? ?Visited at bedside with family present. Patient is sitting up in chair. States he feels improvement since thoracentesis yesterday but is sore at injection site. He also expresses sadness at and disappointment in learning of the worsening condition of his lungs. He does want to return home as soon as he is medically stable in order to spend as much time with his family as possible.  ? ?VS: 98.0, 104/65, 134, 31, 96% 4Lnc ?I/O: 443.01/3199 ? ?Abnormal Labs: ?12/24/21 05:53 ?Sodium: 131 (L) ?Glucose: 110 (H) ?Calcium: 8.4 (L) ?RBC: 2.56 (L) ?Hemoglobin: 8.5 (L) ?HCT: 26.6 (L) ?MCV: 103.9 (H) ?RDW: 17.1 (H) ?12/23/21 10:40 ?LD, Fluid: 94 (H) ?Total protein, fluid: <3.0 ?Fluid Type-FTP: PLEURAL ?Color, Fluid: YELLOW ! ? ?IV/PRN medications:  Cardizem Drip rate 48ml/hr (5-15mg /hr to maintain HR 65-100), currently at 15mg /hr, Xanax 0.5mg  at 2125 on 3/22 ? ?Problem list:  ?Assessment and Plan: ?* Acute on chronic respiratory failure with hypoxia (HCC) ?In setting of multifocal pneumonia, known lung cancer and concern for mucous plugging. Patient uses 4 L/min of oxygen at baseline. Patient required BiPAP on admission and has been weaned to 5-6 L/min via [place] ?Continue to wean. ?SP thoracentesis for bilateral pleural effusion on 3/22. ?Appears to  have multifocal pneumonia as primary presentation.  Currently being treated. ?  ?Multifocal pneumonia ?Noted on CT imaging. No leukocytosis. Afebrile. Complicated by known lung cancer (last chemotherapy treatment in January 2023). Started empirically on Ceftriaxone IV, doxycycline IV and Vancomycin IV. MRSA PCR negative. Strep pneumoniae urine ag negative. RVP negative. Blood cultures pending. Patient is on amiodarone as an outpatient which complicates picture. Patient was started on Cefdinir and doxycycline as an outpatient for treatment and received a two days of treatment. Vancomycin discontinued ?-Continue Ceftriaxone and doxycycline PO ?-Follow blood cultures and legionella ag ?-Await sputum sample for culture, if able ?  ?Paroxysmal atrial fibrillation with RVR ([place]) ?RVR in setting of lack of medication administration. Possibly contributed to by acute infection. Started on diltiazem drip. Toprol XL held secondary to NPO status on admission. Cardiology consulted, management per cardiology. ?-Continue diltiazem drip ?-Continue home Toprol XL ?-Continue Xarelto ?Do not think that pulmonary infiltrate are secondary to amiodarone. ?  ?COPD (chronic obstructive pulmonary disease) ([place]) ?No wheezing noted on exam. Complicates current presentation. ?-Continue Pulmicort, Xopenex ?  ?Non-small cell carcinoma of left lung, stage 4 ([place]) ?Patient follows with Dr. [name] as an outpatient. Previously on chemotherapy but now has no further options for treatment. Enrolled in hospice but is currently DNR full scope. ?  ?Essential hypertension ?Slightly hypotensive overnight. Now improved. ?-Continue metoprolol ?  ?OSA on CPAP ?Patient started on BiPAP and is currently ordered QHS. ?  ?Constipation ?-Miralax BID ?-Dulcolax suppository x1 ?  ?Macrocytic anemia ?Baseline hemoglobin between 8-9.  Currently stable.  Monitor. ?  ?GERD (gastroesophageal reflux disease) ?-Continue Protonix while inpatient (  substituted for  Prilosec) ? ?GOC: Pt is DNR, however full medical scope  ?D/C planning: ongoing, return home with hospice when stable for DC ?Family: present and updated ?IDT: hospice team updated ?  ?If transport should be needed at discharge, please use [facility] as they contract this service for our hospice patients. ? ?  ?Please do not hesitate to call with questions.   ?Thank you,   ?Farrel Gordon, RN, CCM      ?[facility] Hospital Liaison   ?[phone] ? ?

## 2021-12-24 NOTE — Progress Notes (Signed)
? ?Progress Note ? ?Patient Name: Corey [name] ?Date of Encounter: 12/24/2021 ? ?Primary Cardiologist: Mertie Moores, MD  ? ?Subjective  ? ?Feeling well.  Seen with his wife at the bedside ? ?Inpatient Medications  ?  ?Scheduled Meds: ? acetylcysteine  2 mL Nebulization BID  ? [START ON 12/25/2021] amiodarone  200 mg Oral Daily  ? benzonatate  200 mg Oral TID  ? bisacodyl  10 mg Rectal Once  ? budesonide  0.5 mg Nebulization BID  ? carbidopa-levodopa  1 tablet Oral 4 times per day  ? Chlorhexidine Gluconate Cloth  6 each Topical Daily  ? [START ON 12/25/2021] digoxin  0.25 mg Intravenous Daily  ? doxycycline  100 mg Oral Q12H  ? fenofibrate  160 mg Oral Q1400  ? fluticasone  2 spray Each Nare Daily  ? folic acid  1 mg Oral Daily  ? guaiFENesin  1,200 mg Oral BID  ? ipratropium  2 spray Each Nare Daily  ? levalbuterol  0.63 mg Nebulization BID  ? loratadine  10 mg Oral QPM  ? magnesium oxide  400 mg Oral Daily  ? metoprolol succinate  25 mg Oral Daily  ? multivitamin with minerals  1 tablet Oral Daily  ? pantoprazole  40 mg Oral Daily  ? polyethylene glycol  17 g Oral BID  ? potassium chloride  10 mEq Oral Q1400  ? rivaroxaban  20 mg Oral Q supper  ? senna-docusate  1 tablet Oral BID  ? tamsulosin  0.4 mg Oral QPC supper  ? topiramate  100 mg Oral QHS  ? topiramate  50 mg Oral Daily  ? ?Continuous Infusions: ? sodium chloride Stopped (12/21/21 1218)  ? diltiazem (CARDIZEM) infusion 15 mg/hr (12/24/21 1536)  ? ?PRN Meds: ?sodium chloride, acetaminophen **OR** acetaminophen, ALPRAZolam, chlorpheniramine-HYDROcodone, metoprolol tartrate, oxyCODONE, prochlorperazine, sodium chloride  ? ?Vital Signs  ?  ?Vitals:  ? 12/24/21 1200 12/24/21 1205 12/24/21 1606 12/24/21 1949  ?BP: 131/61  118/70 113/66  ?Pulse: (!) 108  81 84  ?Resp: (!) 22  (!) 22   ?Temp:  98.7 ?F (37.1 ?C) 98.3 ?F (36.8 ?C) 98.7 ?F (37.1 ?C)  ?TempSrc:  Oral Oral Oral  ?SpO2: 99%  98% 97%  ?Weight:      ?Height:      ? ? ?Intake/Output Summary (Last 24 hours)  at 12/24/2021 2057 ?Last data filed at 12/24/2021 1200 ?Gross per 24 hour  ?Intake 364.89 ml  ?Output 1200 ml  ?Net -835.11 ml  ? ?Filed Weights  ? 12/20/21 0631  ?Weight: 102.5 kg  ? ? ?Telemetry  ?  ?Atrial fibrillation controlled ventricular response- Personally Reviewed ? ?ECG  ?  ?No new- Personally Reviewed ? ?Physical Exam  ? ?GEN: No acute distress.   ?Neck: No JVD ?Cardiac: irregular rhythm, normal rate, no murmurs, rubs, or gallops.  ?Respiratory: Clear to auscultation bilaterally. ?GI: Soft, nontender, non-distended  ?MS: 1+ ankle edema; No deformity. ?Neuro:  Nonfocal  ?Psych: Normal affect  ? ?Labs  ?  ?Chemistry ?Recent Labs  ?Lab 12/20/21 ?0656 12/21/21 ?1610 12/22/21 ?9604 12/23/21 ?5409 12/24/21 ?8119  ?NA 128* 131*  --  131* 131*  ?K 3.7 4.0  --  3.1* 4.1  ?CL 94* 99  --  95* 98  ?CO2 23 21*  --  28 27  ?GLUCOSE 106* 85  --  108* 110*  ?BUN 12 15  --  12 12  ?CREATININE 0.77 0.91 0.78 0.80 0.76  ?CALCIUM 8.4* 8.4*  --  8.3*  8.4*  ?PROT 6.7 5.8*  --   --   --   ?ALBUMIN 2.7* 2.4*  --   --   --   ?AST 17 16  --   --   --   ?ALT 8 10  --   --   --   ?ALKPHOS 45 36*  --   --   --   ?BILITOT 0.7 0.7  --   --   --   ?GFRNONAA >60 >60 >60 >60 >60  ?ANIONGAP 11 11  --  8 6  ?  ? ?Hematology ?Recent Labs  ?Lab 12/22/21 ?0730 12/23/21 ?1610 12/24/21 ?9604  ?WBC 7.9 6.2 7.3  ?RBC 2.63* 2.55* 2.56*  ?HGB 8.6* 8.4* 8.5*  ?HCT 27.1* 26.1* 26.6*  ?MCV 103.0* 102.4* 103.9*  ?MCH 32.7 32.9 33.2  ?MCHC 31.7 32.2 32.0  ?RDW 17.5* 17.2* 17.1*  ?PLT 319 298 297  ? ? ?Cardiac EnzymesNo results for input(s): TROPONINI in the last 168 hours. No results for input(s): TROPIPOC in the last 168 hours.  ? ?BNPNo results for input(s): BNP, PROBNP in the last 168 hours.  ? ?DDimer No results for input(s): DDIMER in the last 168 hours.  ? ?Radiology  ?  ?DG CHEST PORT 1 VIEW ? ?Result Date: 12/23/2021 ?CLINICAL DATA:  Thoracentesis. EXAM: PORTABLE CHEST 1 VIEW COMPARISON:  12/23/2021 and CT chest 12/20/2021. FINDINGS: Patient is  slightly rotated. Trachea is midline. A loop recorder is in place. Near complete opacification of the left hemithorax with minimal aeration in the left upper lobe. No pneumothorax. Hazy right basilar airspace opacification with a small right pleural effusion. IMPRESSION: 1. No definite pneumothorax status post thoracentesis. Continued near complete opacification of left hemithorax, reflecting a combination of collapse/consolidation/pneumonia and pleural fluid. 2. Additional airspace consolidation in the right perihilar region and right lung base, compatible with pneumonia. Electronically Signed   By: Lorin Picket M.D.   On: 12/23/2021 11:59  ? ?DG CHEST PORT 1 VIEW ? ?Result Date: 12/23/2021 ?CLINICAL DATA:  Lung cancer EXAM: PORTABLE CHEST 1 VIEW COMPARISON:  Chest radiograph 12/21/2021 chest CT 12/19/2021 FINDINGS: The right chest wall port is stable in position. A loop recorder is again seen. The heart is not well evaluated. There is unchanged complete opacification of the left hemithorax with leftward deviation of the trachea. Aeration of the right lung is unchanged, with a small right pleural effusion and adjacent opacities. The right upper lung is well-aerated. There is no appreciable pneumothorax. There is no acute osseous abnormality. IMPRESSION: Overall, no significant interval change since 12/21/2021. Unchanged complete opacification of the left hemithorax and small right pleural effusion with adjacent opacities. Electronically Signed   By: Valetta Mole M.D.   On: 12/23/2021 08:08   ? ?Cardiac Studies  ? ? ?Patient Profile  ?   ?76 y.o. 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 ?Assessment & Plan  ? ?Principal Problem: ?   Acute on chronic respiratory failure with hypoxia ([place]) ?Active Problems: ?  HLD (hyperlipidemia) ?  GERD (gastroesophageal reflux disease) ?  OSA on CPAP ?  Essential hypertension ?  Bilateral pleural effusion ?  Non-small cell carcinoma of left lung, stage 4 ([place]) ?  Goals  of care, counseling/discussion ?  Macrocytic anemia ?  COPD (chronic obstructive pulmonary disease) ([place]) ?  Paroxysmal atrial fibrillation with RVR ([place]) ?  Multifocal pneumonia ?  Constipation ? ?Atrial fibrillation with RVR/atrial flutter ?-He has responded very well to IV digoxin and rates have normalized.  Remains in atrial fibrillation. ?-He plans to return home on hospice and we discussed medication therapy that would be in line with his goals of care.  Okay to continue digoxin while in hospital, however routine lab test for digoxin levels would not be part of his goals of care therefore we will discontinue digoxin at hospital discharge and resume amiodarone which was his prior home medication tomorrow.  He is agreeable to this plan and understands that there may be limited options for rate control given comorbid medical issues. ?  ?Multifocal pneumonia with mucous plugging per pulmonology service.  PCCM has signed off, he is status postthoracentesis.  Feeling much better. ?  ?CAD: Denies any chest pain.  No anginal symptom. ?  ?Hypertension: Blood pressure stable today  ?  ?Hyperlipidemia: Given end-stage cancer, he is no longer on statin therapy ?  ?Stage IV lung cancer: Patient is DNR.  Palliative care on board.  He was recently enrolled with home hospice as well.  Poor long-term prognosis ?  ?Anemia: Hemoglobin stable. ? ?  ?   ? ?For questions or updates, please contact [place] ?Please consult [email or web address] for contact info under  ? ?  ?   ?Signed, ?Elouise Munroe, MD  ?12/24/2021, 8:57 PM   ? ?

## 2021-12-24 NOTE — Progress Notes (Signed)
Occupational Therapy Evaluation ? ?Patient lives at home with spouse and reports he primarily pivot transfers to bedside commode or wheelchair, not really ambulatory. Patient states he can usually wipe 80% perianal hygiene, spouse does other 20%. Patient has a lot of support at home from spouse, children, friends from church. Currently patient overall min A to complete stand pivot transfer to recliner chair with rolling walker. Will keep patient on for acute OT services to ensure maximal strength/endurance in order to decrease caregiver burden with functional transfers/self care tasks. Plan is to D/C home on hospice. ? ? ? 12/24/21 1200  ?OT Visit Information  ?Last OT Received On 12/24/21  ?Assistance Needed +1  ?PT/OT/SLP Co-Evaluation/Treatment Yes  ?Reason for Co-Treatment For patient/therapist safety;To address functional/ADL transfers  ?PT goals addressed during session Mobility/safety with mobility  ?OT goals addressed during session ADL's and self-care  ?History of Present Illness Pt. is a 76 y.o. male presenting [facility] with worsening dyspnea. Imaging shows patient to have multifocal pneumonia in addition to mucous plugging. PMH significant for  paroxysmal a-fib, stage IV NSCLC, chronic respiratory failure, COPD, hyperlipidemia, hypertension, GERD, CAD.  ?Precautions  ?Precautions Fall  ?Restrictions  ?Weight Bearing Restrictions No  ?Home Living  ?Family/patient expects to be discharged to: Private residence  ?Living Arrangements Spouse/significant other  ?Available Help at Discharge Family;Available PRN/intermittently;Friend(s);Neighbor  ?Type of Home House  ?Home Access Stairs to enter  ?Entrance Stairs-Number of Steps 2+2  ?Entrance Stairs-Rails None  ?Home Layout Two level;Bed/bath upstairs;1/2 bath on main level  ?Alternate Level Stairs-Number of Steps flight  ?Alternate Level Stairs-Rails Right;Left  ?Bathroom Shower/Tub Tub/shower unit  ?Bathroom Toilet Handicapped height  ?Home Equipment  Rollator (4 wheels);Cane - single point;Shower Land (2 wheels);Hand held shower head;Transport chair;Adaptive equipment  ?Adaptive Equipment Reacher;Sock aid;Long-handled shoe horn;Long-handled sponge  ?Prior Function  ?Prior Level of Function  Needs assist  ?Mobility Comments pivot to/from w/c  ?ADLs Comments Patient reports he was able to perform 80% of peri care after bowel movement, spouse performs other 20%. Has primarily been in bed or wheelchair since last hospital admission. Has family assist as needed  ?Communication  ?Communication No difficulties  ?Pain Assessment  ?Pain Assessment No/denies pain  ?Cognition  ?Arousal/Alertness Awake/alert  ?Behavior During Therapy [facility] for tasks assessed/performed  ?Overall Cognitive Status No family/caregiver present to determine baseline cognitive functioning  ?General Comments Patient stating he was not on home hospice yet howevr per chart and RN he was  ?Upper Extremity Assessment  ?Upper Extremity Assessment Overall WFL for tasks assessed  ?Lower Extremity Assessment  ?Lower Extremity Assessment Defer to PT evaluation  ?Cervical / Trunk Assessment  ?Cervical / Trunk Assessment Normal  ?ADL  ?Overall ADL's  Needs assistance/impaired  ?Eating/Feeding Independent;Sitting  ?Grooming Set up;Sitting  ?Upper Body Bathing Minimal assistance;Sitting  ?Lower Body Bathing Maximal assistance;Sitting/lateral leans;Sit to/from stand  ?Upper Body Dressing  Set up;Sitting  ?Lower Body Dressing Maximal assistance;Sit to/from stand;Sitting/lateral leans  ?Toilet Transfer Minimal assistance;Stand-pivot;Rolling walker (2 wheels)  ?Toilet Transfer Details (indicate cue type and reason) Patient needing min A to power up to standing from edge of bed, min G assist to pivot to recliner chair taking few steps  ?Toileting- Clothing Manipulation and Hygiene Moderate assistance;Sit to/from stand;Sitting/lateral lean  ?Functional mobility during ADLs Minimal assistance;Rolling  walker (2 wheels)  ?Bed Mobility  ?Overal bed mobility Needs Assistance  ?Bed Mobility Supine to Sit  ?Supine to sit Min guard  ?General bed mobility comments Min guard for saftey  ?  Balance  ?Overall balance assessment Needs assistance  ?Sitting-balance support Feet supported  ?Sitting balance-Leahy Scale Fair  ?Standing balance support Bilateral upper extremity supported;During functional activity  ?Standing balance-Leahy Scale Poor  ?Standing balance comment Requires UE support for all standing  ?OT - End of Session  ?Equipment Utilized During [employer] walker (2 wheels);Gait belt  ?Activity Tolerance Patient tolerated treatment well  ?Patient left in chair;with call bell/phone within reach  ?Nurse Communication Mobility status  ?OT Assessment  ?OT Recommendation/Assessment Patient needs continued OT Services  ?OT Visit Diagnosis Other abnormalities of gait and mobility (R26.89)  ?OT Problem List Decreased activity tolerance;Cardiopulmonary status limiting activity  ?OT Plan  ?OT Frequency (ACUTE ONLY) Min 2X/week  ?OT Treatment/Interventions (ACUTE ONLY) Self-care/ADL training;Balance training;Patient/family education;Therapeutic activities  ?AM-PAC OT "6 Clicks" Daily Activity Outcome Measure (Version 2)  ?Help from another person eating meals? 4  ?Help from another person taking care of personal grooming? 3  ?Help from another person toileting, which includes using toliet, bedpan, or urinal? 2  ?Help from another person bathing (including washing, rinsing, drying)? 2  ?Help from another person to put on and taking off regular upper body clothing? 3  ?Help from another person to put on and taking off regular lower body clothing? 2  ?6 Click Score 16  ?Progressive Mobility  ?What is the highest level of mobility based on the progressive mobility assessment? Level 4 (Walks with assist in room) - Balance while marching in place and cannot step forward and back - Complete  ?Activity Transferred from bed to  chair  ?OT Recommendation  ?Follow Up Recommendations No OT follow up ?(home hospice)  ?Assistance recommended at discharge Frequent or constant Supervision/Assistance  ?Patient can return home with the following A little help with walking and/or transfers;A lot of help with bathing/dressing/bathroom;Assistance with cooking/housework;Direct supervision/assist for medications management;Direct supervision/assist for financial management;Assist for transportation;Help with stairs or ramp for entrance  ?Functional Status Assessent Patient has had a recent decline in their functional status and demonstrates the ability to make significant improvements in function in a reasonable and predictable amount of time.  ?OT Equipment None recommended by OT  ?Individuals Consulted  ?Consulted and Agree with Results and Recommendations Patient  ?Acute Rehab OT Goals  ?Patient Stated Goal Home with hospice  ?OT Goal Formulation With patient  ?Time For Goal Achievement 01/07/22  ?Potential to [place]  ?OT Time Calculation  ?OT Start Time (ACUTE ONLY) 0941  ?OT Stop Time (ACUTE ONLY) 1006  ?OT Time Calculation (min) 25 min  ?OT General Charges  ?$OT Visit 1 Visit  ?OT Evaluation  ?$OT Eval Low Complexity 1 Low  ?Written Expression  ?Dominant Hand Right  ? ?Delbert Phenix OT ?OT pager: [phone] ? ?

## 2021-12-25 LAB — CBC
HCT: 26.4 % — ABNORMAL LOW (ref 39.0–52.0)
Hemoglobin: 8.2 g/dL — ABNORMAL LOW (ref 13.0–17.0)
MCH: 32.7 pg (ref 26.0–34.0)
MCHC: 31.1 g/dL (ref 30.0–36.0)
MCV: 105.2 fL — ABNORMAL HIGH (ref 80.0–100.0)
Platelets: 305 10*3/uL (ref 150–400)
RBC: 2.51 MIL/uL — ABNORMAL LOW (ref 4.22–5.81)
RDW: 17.2 % — ABNORMAL HIGH (ref 11.5–15.5)
WBC: 6.9 10*3/uL (ref 4.0–10.5)
nRBC: 0 % (ref 0.0–0.2)

## 2021-12-25 LAB — CULTURE, BLOOD (ROUTINE X 2)
Culture: NO GROWTH
Culture: NO GROWTH
Special Requests: ADEQUATE

## 2021-12-25 LAB — BASIC METABOLIC PANEL
Anion gap: 6 (ref 5–15)
BUN: 14 mg/dL (ref 8–23)
CO2: 26 mmol/L (ref 22–32)
Calcium: 8.3 mg/dL — ABNORMAL LOW (ref 8.9–10.3)
Chloride: 100 mmol/L (ref 98–111)
Creatinine, Ser: 0.79 mg/dL (ref 0.61–1.24)
GFR, Estimated: >60 mL/min (ref >60)
Glucose, Bld: 116 mg/dL — ABNORMAL HIGH (ref 70–99)
Potassium: 3.9 mmol/L (ref 3.5–5.1)
Sodium: 132 mmol/L — ABNORMAL LOW (ref 135–145)

## 2021-12-25 LAB — GLUCOSE, CAPILLARY: Glucose-Capillary: 137 mg/dL — ABNORMAL HIGH (ref 70–99)

## 2021-12-25 LAB — MAGNESIUM: Magnesium: 1.7 mg/dL (ref 1.7–2.4)

## 2021-12-25 MED ORDER — MORPHINE SULFATE ER 15 MG PO TBCR
15.0000 mg | EXTENDED_RELEASE_TABLET | Freq: Every day | ORAL | Status: DC
  Administered 2021-12-25: 15 mg via ORAL
  Filled 2021-12-25: qty 1

## 2021-12-25 MED ORDER — SODIUM CHLORIDE 0.9% FLUSH
10.0000 mL | INTRAVENOUS | Status: DC | PRN
  Administered 2021-12-26: 10 mL

## 2021-12-25 MED ORDER — DILTIAZEM HCL ER COATED BEADS 180 MG PO CP24
360.0000 mg | ORAL_CAPSULE | Freq: Every day | ORAL | Status: DC
  Administered 2021-12-25 – 2021-12-26 (×2): 360 mg via ORAL
  Filled 2021-12-25 (×2): qty 2

## 2021-12-25 MED ORDER — SODIUM CHLORIDE 0.9% FLUSH
10.0000 mL | Freq: Two times a day (BID) | INTRAVENOUS | Status: DC
  Administered 2021-12-25 – 2021-12-26 (×2): 10 mL

## 2021-12-25 MED ORDER — LORAZEPAM 0.5 MG PO TABS: 0.5000 mg | ORAL_TABLET | Freq: Four times a day (QID) | ORAL | Status: DC | PRN

## 2021-12-25 MED ORDER — FUROSEMIDE 40 MG PO TABS
40.0000 mg | ORAL_TABLET | Freq: Every day | ORAL | Status: DC
  Administered 2021-12-25 – 2021-12-26 (×2): 40 mg via ORAL
  Filled 2021-12-25 (×2): qty 1

## 2021-12-25 NOTE — Progress Notes (Signed)
Pt oral Diltiazem given at 1021, pt IV infusion stopped at ordered and d/c'd, tolerating well. Will cont to monitor heartrate. SRP, RN ?

## 2021-12-25 NOTE — Progress Notes (Addendum)
? ?Progress Note ? ?Patient Name: Corey [name] ?Date of Encounter: 12/25/2021 ? ?[facility] Cardiologist: Mertie Moores, MD  ? ?Subjective  ? ?Breathing better. Pain at the thoracentesis site  ? ?Inpatient Medications  ?  ?Scheduled Meds: ? acetylcysteine  2 mL Nebulization BID  ? amiodarone  200 mg Oral Daily  ? benzonatate  200 mg Oral TID  ? bisacodyl  10 mg Rectal Once  ? budesonide  0.5 mg Nebulization BID  ? carbidopa-levodopa  1 tablet Oral 4 times per day  ? Chlorhexidine Gluconate Cloth  6 each Topical Daily  ? digoxin  0.25 mg Intravenous Daily  ? diltiazem  360 mg Oral Daily  ? fenofibrate  160 mg Oral Q1400  ? fluticasone  2 spray Each Nare Daily  ? folic acid  1 mg Oral Daily  ? guaiFENesin  1,200 mg Oral BID  ? ipratropium  2 spray Each Nare Daily  ? levalbuterol  0.63 mg Nebulization BID  ? loratadine  10 mg Oral QPM  ? magnesium oxide  400 mg Oral Daily  ? metoprolol succinate  25 mg Oral Daily  ? multivitamin with minerals  1 tablet Oral Daily  ? pantoprazole  40 mg Oral Daily  ? polyethylene glycol  17 g Oral BID  ? potassium chloride  10 mEq Oral Q1400  ? rivaroxaban  20 mg Oral Q supper  ? senna-docusate  1 tablet Oral BID  ? sodium chloride flush  10-40 mL Intracatheter Q12H  ? tamsulosin  0.4 mg Oral QPC supper  ? topiramate  100 mg Oral QHS  ? topiramate  50 mg Oral Daily  ? ?Continuous Infusions: ? sodium chloride Stopped (12/21/21 1218)  ? diltiazem (CARDIZEM) infusion 10 mg/hr (12/24/21 2300)  ? ?PRN Meds: ?sodium chloride, acetaminophen **OR** acetaminophen, ALPRAZolam, chlorpheniramine-HYDROcodone, metoprolol tartrate, oxyCODONE, prochlorperazine, sodium chloride, sodium chloride flush  ? ?Vital Signs  ?  ?Vitals:  ? 12/25/21 0009 12/25/21 0431 12/25/21 0900 12/25/21 0916  ?BP: 110/69 101/64  117/75  ?Pulse: 88 94  88  ?Resp: (!) 22 20  17   ?Temp: 97.7 ?F (36.5 ?C) 98.2 ?F (36.8 ?C)  98.1 ?F (36.7 ?C)  ?TempSrc: Oral Oral  Oral  ?SpO2: 100% 100% 96% 96%  ?Weight:      ?Height:       ? ? ?Intake/Output Summary (Last 24 hours) at 12/25/2021 1016 ?Last data filed at 12/25/2021 1027 ?Gross per 24 hour  ?Intake 496.89 ml  ?Output 1550 ml  ?Net -1053.11 ml  ? ? ?  12/20/2021  ?  6:31 AM 11/21/2021  ?  2:17 AM 11/15/2021  ?  6:42 PM  ?Last 3 Weights  ?Weight (lbs) 226 lb 231 lb 4.2 oz 231 lb 4.2 oz  ?Weight (kg) 102.513 kg 104.9 kg 104.9 kg  ?   ? ?Telemetry  ?  ?Atrial flutter converted to NSR at 3:35:59AM this morning after a PVC - Personally Reviewed ? ?ECG  ?  ?3/20 atrial flutter with RVR - Personally Reviewed ? ?Physical Exam  ? ?GEN: No acute distress.   ?Neck: No JVD ?Cardiac: RRR, no murmurs, rubs, or gallops.  ?Respiratory: Clear to auscultation bilaterally. Mildly diminished breath sound in the left lung ?GI: Soft, nontender, non-distended  ?MS: No edema; No deformity. ?Neuro:  Nonfocal  ?Psych: Normal affect  ? ?Labs  ?  ?High Sensitivity Troponin:  No results for input(s): TROPONINIHS in the last 720 hours.   ?Chemistry ?Recent Labs  ?Lab 12/20/21 ?2536 12/21/21 ?6440  12/22/21 ?5631 12/23/21 ?4970 12/24/21 ?2637 12/25/21 ?8588  ?NA 128* 131*  --  131* 131* 132*  ?K 3.7 4.0  --  3.1* 4.1 3.9  ?CL 94* 99  --  95* 98 100  ?CO2 23 21*  --  28 27 26   ?GLUCOSE 106* 85  --  108* 110* 116*  ?BUN 12 15  --  12 12 14   ?CREATININE 0.77 0.91   < > 0.80 0.76 0.79  ?CALCIUM 8.4* 8.4*  --  8.3* 8.4* 8.3*  ?MG  --   --   --   --  1.8 1.7  ?PROT 6.7 5.8*  --   --   --   --   ?ALBUMIN 2.7* 2.4*  --   --   --   --   ?AST 17 16  --   --   --   --   ?ALT 8 10  --   --   --   --   ?ALKPHOS 45 36*  --   --   --   --   ?BILITOT 0.7 0.7  --   --   --   --   ?GFRNONAA >60 >60   < > >60 >60 >60  ?ANIONGAP 11 11  --  8 6 6   ? < > = values in this interval not displayed.  ?  ?Lipids No results for input(s): CHOL, TRIG, HDL, LABVLDL, LDLCALC, CHOLHDL in the last 168 hours.  ?Hematology ?Recent Labs  ?Lab 12/23/21 ?5027 12/24/21 ?7412 12/25/21 ?8786  ?WBC 6.2 7.3 6.9  ?RBC 2.55* 2.56* 2.51*  ?HGB 8.4* 8.5* 8.2*  ?HCT 26.1*  26.6* 26.4*  ?MCV 102.4* 103.9* 105.2*  ?MCH 32.9 33.2 32.7  ?MCHC 32.2 32.0 31.1  ?RDW 17.2* 17.1* 17.2*  ?PLT 298 297 305  ? ?Thyroid No results for input(s): TSH, FREET4 in the last 168 hours.  ?BNPNo results for input(s): BNP, PROBNP in the last 168 hours.  ?DDimer No results for input(s): DDIMER in the last 168 hours.  ? ?Radiology  ?  ?DG CHEST PORT 1 VIEW ? ?Result Date: 12/23/2021 ?CLINICAL DATA:  Thoracentesis. EXAM: PORTABLE CHEST 1 VIEW COMPARISON:  12/23/2021 and CT chest 12/20/2021. FINDINGS: Patient is slightly rotated. Trachea is midline. A loop recorder is in place. Near complete opacification of the left hemithorax with minimal aeration in the left upper lobe. No pneumothorax. Hazy right basilar airspace opacification with a small right pleural effusion. IMPRESSION: 1. No definite pneumothorax status post thoracentesis. Continued near complete opacification of left hemithorax, reflecting a combination of collapse/consolidation/pneumonia and pleural fluid. 2. Additional airspace consolidation in the right perihilar region and right lung base, compatible with pneumonia. Electronically Signed   By: Lorin Picket M.D.   On: 12/23/2021 11:59   ? ?Cardiac Studies  ? ?Echo 08/04/2021 ? 1. Left ventricular ejection fraction, by estimation, is 65 to 70%. The  ?left ventricle has normal function. The left ventricle has no regional  ?wall motion abnormalities. There is mild left ventricular hypertrophy.  ?Left ventricular diastolic parameters  ?are indeterminate.  ? 2. Right ventricular systolic function mild to moderately reduced. The  ?right ventricular size is mildly enlarged. There is normal pulmonary  ?artery systolic pressure.  ? 3. Trivial mitral valve regurgitation.  ? 4. The aortic valve is tricuspid. Aortic valve regurgitation is not  ?visualized. Mild aortic valve sclerosis is present, with no evidence of  ?aortic valve stenosis.  ? 5. Aortic dilatation noted. There is mild dilatation of  the  ascending  ?aorta, measuring 40 mm.  ? 6. The inferior vena cava is normal in size with greater than 50%  ?respiratory variability, suggesting right atrial pressure of 3 mmHg.  ? ?Patient Profile  ?   ?76 y.o. 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 ?Assessment & Plan  ?  ?Atrial fibrillation with RVR/atrial flutter ? - h/o both afib and atrial flutter.  ? - Home 360 Cardizem CD has been converted to IV diltiazem, now converting back to PO Cardizem ? - amiodarone was started in Jan 2023, temp held during this admission due to concern of pulm infiltrate, however on closer examination, his presentation is unlikely to be related to amiodarone induced pulmonary toxicity, therefore PO amiodarone has been restarted.  ? - IV digoxin added to help with rate control, loaded with 3 doses. Patient converted from aflutter to NSR around 3:35AM this morning. Check digoxin level in a few days. Will d/c digoxin now. ? - discussed with the patient, given underlying lung issue and CA, would not be surprised if he goes in and out of afib/aflutter in the future. The goal is rate control for now.  ? ?Multiple focal PNA with mucous plugging per pulmonology service: Whiteout of left lung on last CXR on 3/22, pulm signed off. underwent R thoracentesis. Breathing better ? ?CAD: Denies any chest pain.  No anginal symptom. ?  ?Hypertension: Blood pressure borderline  ?  ?Hyperlipidemia: Given end-stage cancer, he is no longer on statin therapy ?  ?Stage IV lung cancer: Patient is DNR.  Palliative care on board.  He was recently enrolled with home hospice as well.  Poor long-term prognosis ?  ?Anemia: Hemoglobin stable.  ? ?   ? ?For questions or updates, please contact  [place] ?Please consult [email or web address] for contact info under  ? ?  ?   ?Signed, ?Almyra Deforest, [place]  ?12/25/2021, 10:16 AM   ? ?Patient seen and examined with Almyra Deforest PA.  Agree as above, with the following ex

## 2021-12-25 NOTE — Progress Notes (Addendum)
[facility] [address] [facility]) Hospitalized Hospice Patient ?  ?Mr. Quillin is a current hospice patient (admitted to hospice on 12/15/21); with a terminal diagnosis of NSCLC; who went to the hospital after becoming increasingly SOB. EMS was activated, hospice was notified and offered to visit in the home, but ultimately the decision was made to send him to the emergency room. Mr. Hnat is admitted with acute on chronic hypoxic respiratory failure. This is a related hospital admission per Dr. Hollace Kinnier, [facility] MD. ?  ?He remains inpatient appropriate due to increased skill level needed for ongoing assessment and management of sepsis and need for IV medications.  ? ?Visited at bedside with family present. Patient has been moved from ICU to a medical telemetry room. He reports feeling better each day. No complaints of pain or discomfort. PT/OT have been working with him. Continues to use the BiPAP at night.  Cardizem infusion has been change to PO daily dosing. He is hopeful to discharge home over the weekend.  ? ?VS: 98.1, 117/75, 88, 17, 96% 4Lnc ?I/O: 548.12/1548 ? ?Abnormal Labs: ?12/25/21 03:43 ?Sodium: 132 (L) ?Glucose: 116 (H) ?Calcium: 8.3 (L) ?RBC: 2.51 (L) ?Hemoglobin: 8.2 (L) ?HCT: 26.4 (L) ?MCV: 105.2 (H) ?RDW: 17.2 (H) ? ?IV/PRN medications: Oxycodone 5mg  PRN @ 0543 ? ?Problem list: ?Assessment and Plan: ?* Acute on chronic respiratory failure with hypoxia (HCC) ?Obstructive sleep apnea on CPAP. ?Multifocal healthcare associated pneumonia, ?known stage IV left lung cancer ?concern for mucous plugging. ?Patient uses 4 L/min of oxygen at baseline. Patient required BiPAP on admission and has been weaned to 5-6 L/min via [place] ?Continue to wean as able ?Currently using BiPAP nightly.  At home uses CPAP nightly. ?S/P thoracentesis for bilateral pleural effusion on 3/22. ?Treated with antibiotics for 5 days. ?Continue flutter valve.  Continue supportive care. ?  ?Multifocal pneumonia ?Started  empirically on Ceftriaxone IV, doxycycline IV and Vancomycin IV. ?MRSA PCR negative. Strep pneumoniae urine ag negative. RVP negative. Blood cultures also so far no growth. ?Patient was started on Cefdinir and doxycycline as an outpatient for treatment and received a two days of treatment.  ?Vancomycin discontinued.  ?I do not think that the amiodarone is responsible for the infiltrates seen on the CT scan. ?5 days antibiotic course completed on 3/23. ?  ?Paroxysmal atrial fibrillation with RVR ([place]) ?RVR in setting of lack of respiratory distress. ?Cardiology consulted, management per cardiology. ?Currently on Cardizem drip, Toprol-XL, digoxin. ?Do not think that pulmonary infiltrate are secondary to amiodarone. ?Also on Xarelto. ?  ?COPD (chronic obstructive pulmonary disease) ([place]) ?No wheezing noted on exam. Complicates current presentation. ?-Continue Pulmicort, Xopenex ?  ?Non-small cell carcinoma of left lung, stage 4 ([place]) ?Patient follows with Dr. [name] as an outpatient. Previously on chemotherapy but now has no further options for treatment.  ?Enrolled in hospice. ?  ?Essential hypertension ?Slightly hypotensive overnight. Now improved. ?-Continue metoprolol ?  ?OSA on CPAP ?Patient started on BiPAP and is currently ordered QHS. ?We will switch back to CPAP. ?  ?Constipation ?-Miralax BID ?-Dulcolax suppository x1 ?  ?Macrocytic anemia ?Baseline hemoglobin between 8-9.  Currently stable.  Monitor. ?  ?GERD (gastroesophageal reflux disease) ?-Continue Protonix while inpatient (substituted for Prilosec) ? ?GOC: Pt is DNR, focus on symptom management and return home with hospice.  ?D/C planning: ongoing, return home with hospice when stable for DC ?Family: present and updated ?IDT: hospice team updated ?  ?If transport should be needed at discharge, please use [facility] as they  contract this service for our hospice patients. ?  ?Please do not hesitate to call with questions.   ?Thank you,   ?Farrel Gordon, RN, CCM      ?[facility] Hospital Liaison   ?[phone] ? ?

## 2021-12-25 NOTE — Progress Notes (Signed)
?Progress Note ? ? ?Patient: Corey [name] ZOX:[id number] DOB: 11-17-1945 DOA: 12/20/2021     Hospitalization day: 5 ?DOS: the patient was seen and examined on 12/25/2021 ? ?Brief hospital course: ?Corey [name] is a 76 y.o. male with a history of paroxysmal atrial fibrillation, stage IV NSCLC, chronic respiratory failure, COPD, hyperlipidemia, hypertension, GERD, CAD. Patient presented secondary to worsening dyspnea. On imaging, patient found to have signs concerning for multifocal pneumonia in addition to mucous plugging. Patient started on antibiotics and Mucomyst. PCCM consulted on admission. ?SP thoracentesis 3/22.  Now heart rate improved.  On oral medication. ? ?Assessment and Plan: ?* Acute on chronic respiratory failure with hypoxia (HCC) ?Obstructive sleep apnea on CPAP. ?Multifocal healthcare associated pneumonia, ?known stage IV left lung cancer ?concern for mucous plugging. ?Patient uses 4 L/min of oxygen at baseline. Patient required BiPAP on admission and has been weaned to 5-6 L/min via [place] ?Continue to wean as able ?Currently using BiPAP nightly.  At home uses CPAP nightly. ?S/P thoracentesis for bilateral pleural effusion on 3/22. ?Will initiate low-dose 40 mg daily Lasix. ?Treated with antibiotics for 5 days. ?Continue flutter valve.  Continue supportive care. ? ?Multifocal pneumonia ?Started empirically on Ceftriaxone IV, doxycycline IV and Vancomycin IV. ?MRSA PCR negative. Strep pneumoniae urine ag negative. RVP negative. Blood cultures also so far no growth. ?Patient was started on Cefdinir and doxycycline as an outpatient for treatment and received a two days of treatment.  ?Vancomycin discontinued.  ?I do not think that the amiodarone is responsible for the infiltrates seen on the CT scan. ?5 days antibiotic course completed on 3/23. ? ?Paroxysmal atrial fibrillation with RVR ([name]) ?RVR in setting of lack of respiratory distress. ?Cardiology consulted, management per cardiology. ?Currently on  Cardizem drip, Toprol-XL, digoxin. ?Do not think that pulmonary infiltrate are secondary to amiodarone. ?Also on Xarelto. ? ?COPD (chronic obstructive pulmonary disease) ([name]) ?No wheezing noted on exam. Complicates current presentation. ?-Continue Pulmicort, Xopenex ? ?Non-small cell carcinoma of left lung, stage 4 ([name]) ?Patient follows with Dr. [name] as an outpatient. Previously on chemotherapy but now has no further options for treatment.  ?Enrolled in hospice. ? ?Essential hypertension ?Slightly hypotensive overnight. Now improved. ?-Continue metoprolol ? ?OSA on CPAP ?Patient started on BiPAP and is currently ordered QHS. ?We will switch back to CPAP. ? ?Constipation ?-Miralax BID ?-Dulcolax suppository x1 ? ?Macrocytic anemia ?Baseline hemoglobin between 8-9.  Currently stable.  Monitor. ? ?GERD (gastroesophageal reflux disease) ?-Continue Protonix while inpatient (substituted for Prilosec) ? ?Goals of care, counseling/discussion ?3/24 discussion with patient as well as wife. ?Educated wife with regards to patient's current condition and future prognosis and how to manage his symptoms down the road to maintain comfort. ?Explained how focusing on oxygen and antibiotic use will only prolong the suffering for the patient. ?I discussed with the patient with regards to his current condition in the setting of recurrent pneumonia with stage IV lung cancer without any therapy options and ongoing respiratory distress. ?I have explained that his shortness of breath, cough and other symptoms will most likely be part of his life and probably will get worse. ?I have explained that in the setting of his ongoing respiratory distress his A-fib will remain poorly controlled which will also contribute to his ongoing distress. ?There is not a lot of room to further uptitrate his cardiac medication. ?Patient is already enrolled in hospice. ?My concern is that the patient's prognosis is very poor to limited to up to 3 to 6  months  or even less.  At request of the patient, I have shared this information with him. ?At present my recommendation would be to focus on symptom control, get him as stable as possible and consider transition to home with hospice as soon as possible. ?Initiate scheduled morphine nightly for his respiratory distress and back pain. ?Patient and wife agree.  Will likely plan for discharging home with hospice on 3/25. ? ?Subjective: No nausea no vomiting no fever no chills.  No chest pain no abdominal pain.  Overnight patient had pain in his back as well as increasing cough.  Passing gas but no BM. ? ?Physical Exam: ?Vitals:  ? 12/25/21 0900 12/25/21 0916 12/25/21 1247 12/25/21 1657  ?BP:  117/75 112/70 117/71  ?Pulse:  88 88 93  ?Resp:  17 19 18   ?Temp:  98.1 ?F (36.7 ?C) 97.9 ?F (36.6 ?C) 98.6 ?F (37 ?C)  ?TempSrc:  Oral Oral Oral  ?SpO2: 96% 96% 100% 100%  ?Weight:      ?Height:      ? ?General: Appear in mild distress; no visible Abnormal Neck Mass Or lumps, Conjunctiva normal ?Cardiovascular: S1 and S2 Present, no Murmur, ?Respiratory: increased respiratory effort, Bilateral Air entry present and  bilateral Crackles, Occasional wheezes ?Abdomen: Bowel Sound present, Non tender  ?Extremities: bilateral Pedal edema ?Neurology: alert and oriented to time, place, and person ?Gait not checked due to patient safety concerns  ? ?Data Reviewed: ?I have Reviewed nursing notes, Vitals, and Lab results since pt's last encounter. Pertinent lab results CBC and BMP ?I have ordered test including CBC and BMP ?I have discussed pt's care plan and test results with Cardiology.  ? ?Family Communication: Wife at bedside ? ?Disposition: ?Status is: Inpatient ?Remains inpatient appropriate because: Ongoing issues with medication adjustments to ensure patient is comfortable on discharge. ? ?Author: ?Berle Mull, MD ?12/25/2021 6:10 PM ? ?For on call review [email or web address]. ?

## 2021-12-25 NOTE — Plan of Care (Signed)
  Problem: Activity: Goal: Ability to tolerate increased activity will improve Outcome: Progressing   Problem: Clinical Measurements: Goal: Ability to maintain a body temperature in the normal range will improve Outcome: Progressing   Problem: Respiratory: Goal: Ability to maintain adequate ventilation will improve Outcome: Progressing Goal: Ability to maintain a clear airway will improve Outcome: Progressing   

## 2021-12-25 NOTE — Progress Notes (Signed)
Physical Therapy Treatment ?Patient Details ?Name: Corey [name] ?MRN: [id number] ?DOB: 1946/07/11 ?Today's Date: 12/25/2021 ? ? ?History of Present Illness Pt. is a 76 y.o. male presenting to [facility] on 3/19 with worsening dyspnea. Imaging shows patient to have multifocal pneumonia in addition to mucous plugging. PMH significant for  paroxysmal a-fib, stage IV NSCLC, chronic respiratory failure, COPD, hyperlipidemia, hypertension, GERD, CAD. ? ?  ?PT Comments  ? ? Assisted pt from bedside commode to bed, after resting for a few minutes he then stood again and took a few steps sideways towards head of bed. Pt fatigues quickly, he was tremulous in standing. Instructed pt/wife in bed level strengthening exercises to be done independently in bed.  ?   ?Recommendations for follow up therapy are one component of a multi-disciplinary discharge planning process, led by the attending physician.  Recommendations may be updated based on patient status, additional functional criteria and insurance authorization. ? ?Follow Up Recommendations ? Other (comment) (home with hospice) ?  ?  ?Assistance Recommended at Discharge Intermittent Supervision/Assistance  ?Patient can return home with the following A little help with walking and/or transfers;Assistance with cooking/housework;Direct supervision/assist for medications management;Assist for transportation;A little help with bathing/dressing/bathroom;Help with stairs or ramp for entrance ?  ?Equipment Recommendations ? None recommended by PT  ?  ?Recommendations for Other Services   ? ? ?  ?Precautions / Restrictions Precautions ?Precautions: Fall ?Restrictions ?Weight Bearing Restrictions: No  ?  ? ?Mobility ? Bed Mobility ?Overal bed mobility: Needs Assistance ?Bed Mobility: Sit to Supine ?  ?  ?  ?Sit to supine: Min assist ?  ?General bed mobility comments: min A for BLEs into bed ?  ? ?Transfers ?Overall transfer level: Needs assistance ?Equipment used: Rolling walker (2  wheels) ?Transfers: Sit to/from Stand, Bed to chair/wheelchair/BSC ?Sit to Stand: Min assist ?  ?Step pivot transfers: Min guard ?  ?  ?  ?General transfer comment: transferred 3 in 1 to bed, then stood and took several lateral steps with RW towards HOB. Pt fatigues quickly, somewhat tremulous in standing. ?  ? ?Ambulation/Gait ?  ?  ?  ?  ?  ?  ?  ?  ? ? ?Stairs ?  ?  ?  ?  ?  ? ? ?Wheelchair Mobility ?  ? ?Modified Rankin (Stroke Patients Only) ?  ? ? ?  ?Balance Overall balance assessment: Needs assistance ?Sitting-balance support: Feet supported ?Sitting balance-Leahy Scale: Fair ?  ?  ?Standing balance support: Bilateral upper extremity supported, During functional activity ?Standing balance-Leahy Scale: Poor ?Standing balance comment: Requires UE support for all standing ?  ?  ?  ?  ?  ?  ?  ?  ?  ?  ?  ?  ? ?  ?Cognition Arousal/Alertness: Awake/alert ?Behavior During Therapy: [facility] for tasks assessed/performed ?Overall Cognitive Status: Within Functional Limits for tasks assessed ?  ?  ?  ?  ?  ?  ?  ?  ?  ?  ?  ?  ?  ?  ?  ?  ?  ?  ?  ? ?  ?Exercises General Exercises - Lower Extremity ?Ankle Circles/Pumps: AROM, Both, 10 reps ?Quad Sets: AROM, Both, 5 reps, Supine ?Gluteal Sets: AROM, Both, 5 reps, Supine ?Other Exercises ?Other Exercises: hip adductor squeezes x 5 isometric ? ?  ?General Comments   ?  ?  ? ?Pertinent Vitals/Pain Pain Assessment ?Pain Assessment: No/denies pain  ? ? ?Home Living   ?  ?  ?  ?  ?  ?  ?  ?  ?  ?   ?  ?  Prior Function    ?  ?  ?   ? ?PT Goals (current goals can now be found in the care plan section) Acute Rehab PT Goals ?Patient Stated Goal: To maintain functional ability. ?PT Goal Formulation: With patient/family ?Time For Goal Achievement: 01/07/22 ?Potential to Achieve Goals: Good ?Progress towards PT goals: Progressing toward goals ? ?  ?Frequency ? ? ? Min 3X/week ? ? ? ?  ?PT Plan Current plan remains appropriate  ? ? ?Co-evaluation   ?  ?  ?  ?  ? ?  ?AM-PAC PT "6 Clicks"  Mobility   ?Outcome Measure ? Help needed turning from your back to your side while in a flat bed without using bedrails?: A Little ?Help needed moving from lying on your back to sitting on the side of a flat bed without using bedrails?: A Little ?Help needed moving to and from a bed to a chair (including a wheelchair)?: A Little ?Help needed standing up from a chair using your arms (e.g., wheelchair or bedside chair)?: A Little ?Help needed to walk in hospital room?: A Little ?Help needed climbing 3-5 steps with a railing? : A Lot ?6 Click Score: 17 ? ?  ?End of Session Equipment Utilized During Treatment: Gait belt ?Activity Tolerance: Patient limited by fatigue ?Patient left: in bed;with bed alarm set ?Nurse Communication: Mobility status ?PT Visit Diagnosis: Unsteadiness on feet (R26.81);Muscle weakness (generalized) (M62.81);Other abnormalities of gait and mobility (R26.89) ?  ? ? ?Time: 1610-9604 ?PT Time Calculation (min) (ACUTE ONLY): 15 min ? ?Charges:  $Therapeutic Activity: 8-22 mins          ?          ? ?Philomena Doheny PT 12/25/2021  ?Acute Rehabilitation Services ?Pager [phone] ?Office [phone] ? ?

## 2021-12-25 NOTE — Progress Notes (Signed)
Occupational Therapy Treatment ?Patient Details ?Name: Corey [name] ?MRN: [id number] ?DOB: Jan 11, 1946 ?Today's Date: 12/25/2021 ? ? ?History of present illness Pt. is a 76 y.o. male presenting to [facility] on 3/19 with worsening dyspnea. Imaging shows patient to have multifocal pneumonia in addition to mucous plugging. PMH significant for  paroxysmal a-fib, stage IV NSCLC, chronic respiratory failure, COPD, hyperlipidemia, hypertension, GERD, CAD. ?  ?OT comments ? Patient progress to min G assist this session with transfer to recliner chair. With increased time patient is self able to sit upright to edge of bed. Min G for safety/line management transferring to recliner chair. Patient also tolerate standing ~2 minutes before transferring to chair. Plan is home with hospice.   ? ?Recommendations for follow up therapy are one component of a multi-disciplinary discharge planning process, led by the attending physician.  Recommendations may be updated based on patient status, additional functional criteria and insurance authorization. ?   ?Follow Up Recommendations ? No OT follow up (plan home hospice)  ?  ?Assistance Recommended at Discharge Frequent or constant Supervision/Assistance  ?Patient can return home with the following ? A little help with walking and/or transfers;A lot of help with bathing/dressing/bathroom;Assistance with cooking/housework;Direct supervision/assist for medications management;Direct supervision/assist for financial management;Assist for transportation;Help with stairs or ramp for entrance ?  ?Equipment Recommendations ? None recommended by OT  ?  ?   ?Precautions / Restrictions Precautions ?Precautions: Fall ?Restrictions ?Weight Bearing Restrictions: No  ? ? ?  ? ?Mobility Bed Mobility ?Overal bed mobility: Modified Independent ?Bed Mobility: Supine to Sit ?  ?  ?  ?  ?  ?General bed mobility comments: Head of bed significantly elevated however patient self able to to sit upright to edge of bed. ?   ? ? ?  ?Balance Overall balance assessment: Needs assistance ?Sitting-balance support: Feet supported ?Sitting balance-Leahy Scale: Fair ?  ?  ?Standing balance support: Bilateral upper extremity supported, During functional activity ?Standing balance-Leahy Scale: Poor ?Standing balance comment: Requires UE support for all standing ?  ?  ?  ?  ?  ?  ?  ?  ?  ?  ?  ?   ? ?ADL either performed or assessed with clinical judgement  ? ?ADL Overall ADL's : Needs assistance/impaired ?  ?  ?  ?  ?  ?  ?  ?  ?  ?  ?  ?  ?Toilet Transfer: Min guard;Stand-pivot;Rolling walker (2 wheels) ?Toilet Transfer Details (indicate cue type and reason): Patient able to power up to standing from edge of bed with increased time. Min G for safety to ambulate to chair ~30ft. Patient also tolerate static standing with walker ~2 minutes while adjusting lines without loss of balance. ?  ?Toileting - Clothing Manipulation Details (indicate cue type and reason): Patient utilize premofit to urinate before transfer. ?  ?  ?Functional mobility during ADLs: Min guard;Rolling walker (2 wheels) ?  ?  ? ? ? ?Cognition Arousal/Alertness: Awake/alert ?Behavior During Therapy: [facility] for tasks assessed/performed ?Overall Cognitive Status: Within Functional Limits for tasks assessed ?  ?  ?  ?  ?  ?  ?  ?  ?  ?  ?  ?  ?  ?  ?  ?  ?  ?  ?  ?   ?   ?   ?   ? ? ?Pertinent Vitals/ Pain       Pain Assessment ?Pain Assessment: No/denies pain ? ?   ?   ? ?Frequency ?  Min 2X/week  ? ? ? ? ?  ?Progress Toward Goals ? ?OT Goals(current goals can now be found in the care plan section) ? Progress towards OT goals: Progressing toward goals ? ?Acute Rehab OT Goals ?Patient Stated Goal: Home with hospice ?OT Goal Formulation: With patient ?Time For Goal Achievement: 01/07/22 ?Potential to Achieve Goals: Good ?ADL Goals ?Pt Will Transfer to Toilet: with supervision;stand pivot transfer;bedside commode ?Pt Will Perform Toileting - Clothing Manipulation and hygiene: with min  assist;sit to/from stand;sitting/lateral leans ?Additional ADL Goal #1: Patient will be supervision level for functional transfers in order to participate in daily routine.  ?Plan Discharge plan remains appropriate   ? ?   ?AM-PAC OT "6 Clicks" Daily Activity     ?Outcome Measure ? ? Help from another person eating meals?: None ?Help from another person taking care of personal grooming?: A Little ?Help from another person toileting, which includes using toliet, bedpan, or urinal?: A Lot ?Help from another person bathing (including washing, rinsing, drying)?: A Lot ?Help from another person to put on and taking off regular upper body clothing?: A Little ?Help from another person to put on and taking off regular lower body clothing?: A Lot ?6 Click Score: 16 ? ?  ?End of Session Equipment Utilized During Treatment: Rolling walker (2 wheels);Oxygen ? ?OT Visit Diagnosis: Other abnormalities of gait and mobility (R26.89) ?  ?Activity Tolerance Patient tolerated treatment well ?  ?Patient Left in chair;with call bell/phone within reach;with family/visitor present ?  ?Nurse Communication Mobility status ?  ? ?   ? ?Time: 6195-0932 ?OT Time Calculation (min): 27 min ? ?Charges: OT General Charges ?$OT Visit: 1 Visit ?OT Treatments ?$Self Care/Home Management : 23-37 mins ? ?Delbert Phenix OT ?OT pager: [phone] ? ? ?Rosemary Holms ?12/25/2021, 1:37 PM ?

## 2021-12-25 NOTE — Progress Notes (Addendum)
Patient ID:Corey [name]      DOB: [birth year]      FHQ:[id number] ? ? ? ?  ?Palliative Medicine Team ? ? ? ?Subjective: Bedside symptom check. Brother and wife bedside at time of visit.  ? ? ?Physical exam: Patient alert and sitting up in recliner chair at time of visit. Patient denies any pain or discomfort. Breathing is even and nonlabored with nasal cannula applied. Patient reports this is the best day he has had since admission.  ? ? ?Assessment and plan: Will continue to follow along for any changes or advances, patient is progressing nicely. Patient reports he spoke with outpatient palliative/hospice representative regarding home services and support and that their discussions are ongoing. No other needs or concerns voiced at this time.  ? ? ?Thank you for allowing the Palliative Medicine Team to assist in the care of this patient. ?  ?  ?Damian Leavell, MSN, RN ?Palliative Medicine Team ?Team Phone: [phone]  ?This phone is monitored 7a-7p, please reach out to attending physician outside of these hours for urgent needs.   ?

## 2021-12-26 DIAGNOSIS — Z515 Encounter for palliative care: Secondary | ICD-10-CM

## 2021-12-26 LAB — BASIC METABOLIC PANEL
Anion gap: 5 (ref 5–15)
BUN: 15 mg/dL (ref 8–23)
CO2: 28 mmol/L (ref 22–32)
Calcium: 8.4 mg/dL — ABNORMAL LOW (ref 8.9–10.3)
Chloride: 100 mmol/L (ref 98–111)
Creatinine, Ser: 0.82 mg/dL (ref 0.61–1.24)
GFR, Estimated: >60 mL/min (ref >60)
Glucose, Bld: 106 mg/dL — ABNORMAL HIGH (ref 70–99)
Potassium: 4 mmol/L (ref 3.5–5.1)
Sodium: 133 mmol/L — ABNORMAL LOW (ref 135–145)

## 2021-12-26 LAB — CBC
HCT: 25.7 % — ABNORMAL LOW (ref 39.0–52.0)
Hemoglobin: 7.9 g/dL — ABNORMAL LOW (ref 13.0–17.0)
MCH: 32.1 pg (ref 26.0–34.0)
MCHC: 30.7 g/dL (ref 30.0–36.0)
MCV: 104.5 fL — ABNORMAL HIGH (ref 80.0–100.0)
Platelets: 313 10*3/uL (ref 150–400)
RBC: 2.46 MIL/uL — ABNORMAL LOW (ref 4.22–5.81)
RDW: 17.3 % — ABNORMAL HIGH (ref 11.5–15.5)
WBC: 7.4 10*3/uL (ref 4.0–10.5)
nRBC: 0 % (ref 0.0–0.2)

## 2021-12-26 LAB — BODY FLUID CULTURE W GRAM STAIN: Culture: NO GROWTH

## 2021-12-26 LAB — MAGNESIUM: Magnesium: 1.6 mg/dL — ABNORMAL LOW (ref 1.7–2.4)

## 2021-12-26 MED ORDER — MAGNESIUM SULFATE 2 GM/50ML IV SOLN
2.0000 g | Freq: Once | INTRAVENOUS | Status: AC
Start: 2021-12-26 — End: 2021-12-26
  Administered 2021-12-26: 2 g via INTRAVENOUS
  Filled 2021-12-26: qty 50

## 2021-12-26 MED ORDER — HEPARIN SOD (PORK) LOCK FLUSH 100 UNIT/ML IV SOLN
500.0000 [IU] | INTRAVENOUS | Status: AC | PRN
  Administered 2021-12-26: 500 [IU]
  Filled 2021-12-26: qty 5

## 2021-12-26 MED ORDER — OXYCODONE HCL 5 MG PO TABS: 5.0000 mg | ORAL_TABLET | ORAL | 0 refills | Status: AC | PRN

## 2021-12-26 MED ORDER — MORPHINE SULFATE ER 15 MG PO TBCR: 15.0000 mg | EXTENDED_RELEASE_TABLET | Freq: Every day | ORAL | 0 refills | Status: AC

## 2021-12-26 MED ORDER — FUROSEMIDE 40 MG PO TABS: 40.0000 mg | ORAL_TABLET | Freq: Every day | ORAL | 0 refills | Status: DC

## 2021-12-26 NOTE — Progress Notes (Signed)
Report received from an RN at this time. Agreed with nurse assessment of patient and will cont to monitor.  ?

## 2021-12-26 NOTE — TOC Transition Note (Signed)
Transition of Care (TOC) - CM/SW Discharge Note ? ? ?Patient Details  ?Name: Corey [name] ?MRN: [id number] ?Date of Birth: [birth year] ? ?Transition of Care (TOC) CM/SW Contact:  ?Tawanna Cooler, RN ?Phone Number: ?12/26/2021, 10:56 AM ? ? ?Clinical Narrative:    ? ?Patient has discharge order to return back home with hospice.  Called [facility] to let them know, states they will see patient either this evening or tomorrow.  Spoke with patient and his wife, Corey [name], to let them know discharging today.  Spoke with patient's nurse, and currently pending IV team to de-access patient's port.  ? ?TOC CM called [facility] to schedule pickup for this patient already on hospice with [facility].  Pick up scheduled for 12:40 pm.  ? ?Paperwork and DNR are on the chart.  ? ?Final next level of care: [place] ?Barriers to Discharge: Barriers Resolved ? ?           ?  ?Patient to be transferred to facility by: [facility] as [facility] patient ?Name of family member notified: Corey [name], wife ?Patient and family notified of of transfer: 12/26/21 ? ? ?Readmission Risk Interventions ? ?  12/20/2021  ?  3:04 PM 08/05/2021  ?  1:22 PM  ?Readmission Risk Prevention Plan  ?Transportation Screening Complete Complete  ?Medication Review Press photographer) Complete   ?PCP or Specialist appointment within 3-5 days of discharge Complete Complete  ?[place] or Home Care Consult Complete Complete  ?SW Recovery Care/Counseling Consult Complete Complete  ?Palliative Care Screening Complete Not Applicable  ?[place] Not Applicable Not Applicable  ? ? ? ? ? ?

## 2021-12-26 NOTE — Discharge Summary (Signed)
Physician Discharge Summary  ?TIMBER LUCARELLI IDP:[id number] DOB: 1945-10-05 DOA: 12/20/2021 ? ?PCP: [name], MD ? ?Admit date: 12/20/2021 ?Discharge date: 12/26/2021 ? ?Admitted From: Home ?Disposition:  Home with hospice  ? ?Discharge Condition: Hospice ?CODE STATUS: DNR  ?Diet recommendation: Regular  ? ?Brief/Interim Summary: ?Corey [name] is a 76 y.o. male with a history of paroxysmal atrial fibrillation, stage IV NSCLC, chronic respiratory failure, COPD, hyperlipidemia, hypertension, GERD, CAD. Patient presented secondary to worsening dyspnea. On imaging, patient found to have signs concerning for multifocal pneumonia in addition to mucous plugging. Patient started on antibiotics and Mucomyst. PCCM consulted on admission.  Patient completed antibiotic therapy, s/p thoracentesis for bilateral pleural effusions on 3/22.  Cardiology was also consulted for atrial fibrillation RVR. ? ?Discharge Diagnoses:  ?Principal Problem: ?  Acute on chronic respiratory failure with hypoxia ([place]) ?Active Problems: ?  Bilateral pleural effusion ?  Multifocal pneumonia ?  Non-small cell carcinoma of left lung, stage 4 ([place]) ?  COPD (chronic obstructive pulmonary disease) ([place]) ?  Paroxysmal atrial fibrillation with RVR ([place]) ?  Essential hypertension ?  HLD (hyperlipidemia) ?  OSA on CPAP ?  Macrocytic anemia ?  Constipation ?  GERD (gastroesophageal reflux disease) ?  Goals of care, counseling/discussion ?  Hospice care patient ? ? ?Acute on chronic respiratory failure with hypoxia ([facility]) ?Patient uses 4 L/min of oxygen at baseline. Patient required BiPAP on admission and has been weaned.  Remains on 4 L nasal cannula O2 this morning ?S/P thoracentesis for bilateral pleural effusion on 3/22 ?Completed antibiotic course ?Continue Lasix ?  ?Multifocal pneumonia ?Started empirically on Ceftriaxone IV, doxycycline IV and Vancomycin IV. ?MRSA PCR negative. Strep pneumoniae urine ag negative. RVP negative. Blood cultures also so far no  growth. ?Patient was started on Cefdinir and doxycycline as an outpatient for treatment and received a two days of treatment.  ?Vancomycin discontinued.  ?5 days antibiotic course completed on 3/23. ?  ?Paroxysmal atrial fibrillation with RVR ([place]) ?RVR in setting of lack of respiratory distress. ?Cardiology consulted ?Currently on Cardizem, amiodarone, Toprol-XL, Xarelto  ?Do not think that pulmonary infiltrate are secondary to amiodarone. ?  ?COPD (chronic obstructive pulmonary disease) ([place]) ?Continue nebulizer treatments ?  ?Non-small cell carcinoma of left lung, stage 4 ([place]) ?Patient follows with Dr. [name] as an outpatient. Previously on chemotherapy but now has no further options for treatment.  ?Home hospice ?  ?Essential hypertension ?Continue toprol, cardizem  ?  ?OSA on CPAP ?CPAP nightly ?   ?Goals of care, counseling/discussion ?Extensive discussion between Dr. Posey Pronto and family.  Patient to return home with home hospice today ? ?Hypomagnesia ?Replace ? ? ?Discharge Instructions ? ?Discharge Instructions   ? ? Call MD for:  difficulty breathing, headache or visual disturbances   Complete by: As directed ?  ? Call MD for:  extreme fatigue   Complete by: As directed ?  ? Call MD for:  persistant dizziness or light-headedness   Complete by: As directed ?  ? Call MD for:  persistant nausea and vomiting   Complete by: As directed ?  ? Call MD for:  severe uncontrolled pain   Complete by: As directed ?  ? Call MD for:  temperature >100.4   Complete by: As directed ?  ? Diet general   Complete by: As directed ?  ? Increase activity slowly   Complete by: As directed ?  ? ?  ? ?Allergies as of 12/26/2021   ? ?   Reactions  ?  Amoxicillin Anaphylaxis, Swelling  ? Tolerates Rocephin  ? Fosinopril Other (See Comments), Cough  ? Muscle aches ?(Monopril)  ? Hydromorphone Nausea And Vomiting  ? Ampicillin Rash  ? Codeine Nausea Only, Other (See Comments)  ? Heavy amounts cause nausea and stomach upset  ? Rosuvastatin  Other (See Comments)  ? Joint pain  ? ?  ? ?  ?Medication List  ?  ? ?STOP taking these medications   ? ?cefdinir 300 MG capsule ?Commonly known as: OMNICEF ?  ?doxycycline 100 MG tablet ?Commonly known as: VIBRA-TABS ?  ? ?  ? ?TAKE these medications   ? ?acetaminophen 325 MG tablet ?Commonly known as: TYLENOL ?Take 2 tablets (650 mg total) by mouth every 6 (six) hours as needed for mild pain (or Fever >/= 101). ?What changed: when to take this ?  ?albuterol (2.5 MG/3ML) 0.083% nebulizer solution ?Commonly known as: PROVENTIL ?Inhale 3 mLs into the lungs every 4 (four) hours as needed for wheezing or shortness of breath. ?What changed:  ?when to take this ?additional instructions ?  ?ALPRAZolam 0.5 MG tablet ?Commonly known as: Duanne Moron ?Take 1 tablet (0.5 mg total) by mouth at bedtime as needed for anxiety. ?  ?amiodarone 200 MG tablet ?Commonly known as: PACERONE ?Take 1 tablet (200 mg total) by mouth daily. ?  ?benzonatate 200 MG capsule ?Commonly known as: TESSALON ?Take 1 capsule (200 mg total) by mouth 3 (three) times daily. ?What changed: when to take this ?  ?budesonide 0.5 MG/2ML nebulizer solution ?Commonly known as: PULMICORT ?Take 2 mLs (0.5 mg total) by nebulization 2 (two) times daily. ?What changed: when to take this ?  ?carbidopa-levodopa 25-100 MG tablet ?Commonly known as: SINEMET IR ?Take 1 tablet by mouth See admin instructions. Take 1 tablet by mouth at 8 AM, 1 PM, 7 PM, and bedtime (10 PM) ?What changed: when to take this ?  ?DELSYM COUGH RELIEF MT ?Use as directed 10 mLs in the mouth or throat at bedtime as needed (for cough). ?  ?diltiazem 360 MG 24 hr capsule ?Commonly known as: CARDIZEM CD ?Take 1 capsule (360 mg total) by mouth daily. ?  ?fenofibrate 160 MG tablet ?TAKE 1 TABLET DAILY. PLEASE KEEP UPCOMING APPT IN MARCH WITH DR. Acie Fredrickson BEFORE ANYMORE REFILLS. ?What changed:  ?how much to take ?how to take this ?when to take this ?additional instructions ?  ?fluticasone 50 MCG/ACT nasal  spray ?Commonly known as: FLONASE ?Place 2 sprays into both nostrils daily. ?  ?folic acid 1 MG tablet ?Commonly known as: FOLVITE ?Take 1 tablet (1 mg total) by mouth daily. ?  ?furosemide 40 MG tablet ?Commonly known as: LASIX ?Take 1 tablet (40 mg total) by mouth daily. ?Start taking on: December 27, 2021 ?  ?guaiFENesin 600 MG 12 hr tablet ?Commonly known as: [place] ?Take 2 tablets (1,200 mg total) by mouth 2 (two) times daily. ?  ?ipratropium 0.03 % nasal spray ?Commonly known as: ATROVENT ?Place 1-2 sprays into both nostrils every 12 (twelve) hours. For drainage ?What changed:  ?how much to take ?when to take this ?additional instructions ?  ?levalbuterol 0.63 MG/3ML nebulizer solution ?Commonly known as: XOPENEX ?Take 3 mLs (0.63 mg total) by nebulization 2 (two) times daily. ?  ?levocetirizine 5 MG tablet ?Commonly known as: XYZAL ?Take 1 tablet (5 mg total) by mouth every evening. ?  ?loratadine 10 MG tablet ?Commonly known as: CLARITIN ?Take 1 tablet (10 mg total) by mouth daily as needed for allergies (nasal drainage). ?What changed: reasons  to take this ?  ?magnesium oxide 400 (240 Mg) MG tablet ?Commonly known as: MAG-OX ?Take 1 tablet (400 mg total) by mouth daily. ?  ?metoprolol succinate 25 MG 24 hr tablet ?Commonly known as: TOPROL-XL ?Take 1 tablet (25 mg total) by mouth daily. ?  ?morphine 15 MG 12 hr tablet ?Commonly known as: MS CONTIN ?Take 1 tablet (15 mg total) by mouth at bedtime. ?  ?multivitamins ther. w/minerals Tabs tablet ?Take 1 tablet by mouth daily with breakfast. ?  ?nitroGLYCERIN 0.4 MG SL tablet ?Commonly known as: NITROSTAT ?Place 1 tablet (0.4 mg total) under the tongue every 5 (five) minutes as needed for chest pain. ?  ?omeprazole 20 MG capsule ?Commonly known as: PRILOSEC ?Take 1 capsule (20 mg total) by mouth daily before breakfast. ?  ?ondansetron 8 MG tablet ?Commonly known as: ZOFRAN ?Take 8 mg by mouth every 8 (eight) hours as needed for nausea or vomiting. ?  ?oxyCODONE 5  MG immediate release tablet ?Commonly known as: Oxy IR/ROXICODONE ?Take 1 tablet (5 mg total) by mouth every 4 (four) hours as needed for moderate pain. ?  ?polyethylene glycol 17 g packet ?Commonly known a

## 2021-12-29 ENCOUNTER — Encounter: Payer: Self-pay | Admitting: Pulmonary Disease

## 2021-12-29 ENCOUNTER — Other Ambulatory Visit: Payer: Medicare PPO

## 2021-12-29 ENCOUNTER — Telehealth: Payer: Self-pay | Admitting: Cardiovascular Disease

## 2021-12-29 ENCOUNTER — Other Ambulatory Visit: Payer: Self-pay | Admitting: Physician Assistant

## 2021-12-29 ENCOUNTER — Ambulatory Visit: Payer: Medicare PPO

## 2021-12-29 ENCOUNTER — Ambulatory Visit: Payer: Medicare PPO | Admitting: Physician Assistant

## 2021-12-29 MED ORDER — AMIODARONE HCL 200 MG PO TABS: 200.0000 mg | ORAL_TABLET | Freq: Every day | ORAL | 3 refills | Status: DC

## 2021-12-29 NOTE — Telephone Encounter (Signed)
Rx sent 

## 2021-12-29 NOTE — Telephone Encounter (Signed)
This is a A-Fib clinic pt 

## 2021-12-29 NOTE — Telephone Encounter (Signed)
? ?*  STAT* If patient is at the pharmacy, call can be transferred to refill team. ? ? ?1. Which medications need to be refilled? (please list name of each medication and dose if known) amiodarone (PACERONE) 200 MG tablet ? ?2. Which pharmacy/location (including street and city if local pharmacy) is medication to be sent to? [id number] - [place], [place] [address] ? ?3. Do they need a 30 day or 90 day supply? 30 days ?

## 2022-01-04 ENCOUNTER — Ambulatory Visit (INDEPENDENT_AMBULATORY_CARE_PROVIDER_SITE_OTHER)

## 2022-01-04 DIAGNOSIS — R55 Syncope and collapse: Secondary | ICD-10-CM

## 2022-01-04 LAB — CUP PACEART REMOTE DEVICE CHECK
Date Time Interrogation Session: 20230401002901
Implantable Pulse Generator Implant Date: 20210526

## 2022-01-06 LAB — CHOLESTEROL, BODY FLUID: Cholesterol, Fluid: 35 mg/dL

## 2022-01-07 ENCOUNTER — Encounter: Payer: Self-pay | Admitting: Internal Medicine

## 2022-01-15 NOTE — Progress Notes (Signed)
Carelink Summary Report / Loop Recorder 

## 2022-01-19 ENCOUNTER — Ambulatory Visit: Payer: Medicare PPO

## 2022-01-19 ENCOUNTER — Other Ambulatory Visit: Payer: Medicare PPO

## 2022-01-19 ENCOUNTER — Ambulatory Visit: Payer: Medicare PPO | Admitting: Internal Medicine

## 2022-01-27 ENCOUNTER — Telehealth: Payer: Self-pay | Admitting: Medical Oncology

## 2022-01-27 ENCOUNTER — Other Ambulatory Visit: Payer: Self-pay | Admitting: Medical Oncology

## 2022-01-27 ENCOUNTER — Other Ambulatory Visit: Payer: Self-pay | Admitting: Internal Medicine

## 2022-01-27 MED ORDER — HYDROCODONE BIT-HOMATROP MBR 5-1.5 MG/5ML PO SOLN
5.0000 mL | Freq: Four times a day (QID) | ORAL | 0 refills | Status: AC | PRN
Start: 2022-01-27 — End: ?

## 2022-01-27 NOTE — Telephone Encounter (Signed)
Refill requested for Hycodan for his cough.  ? ?Hycodan 5-1.5 mg/5 ml po q 12 hours prn cough. ?[facility] uses [facility] [address]. ? ?I do not see Hycodan on his MAR and the hospice nurse looked at his  bottle and that is what he has at home from another prescriber and it  helps . He only has a few more days worth of the hycodan.  ?

## 2022-02-04 LAB — CUP PACEART REMOTE DEVICE CHECK
Date Time Interrogation Session: 20230504002929
Implantable Pulse Generator Implant Date: 20210526

## 2022-02-08 ENCOUNTER — Ambulatory Visit (INDEPENDENT_AMBULATORY_CARE_PROVIDER_SITE_OTHER): Payer: Medicare PPO

## 2022-02-08 DIAGNOSIS — R55 Syncope and collapse: Secondary | ICD-10-CM | POA: Diagnosis not present

## 2022-02-17 ENCOUNTER — Other Ambulatory Visit: Payer: Self-pay | Admitting: Physician Assistant

## 2022-02-17 NOTE — Telephone Encounter (Signed)
This is a [facility] clinic pt. Pt still being seen by the [facility] clinic. Thanks  ?

## 2022-02-23 NOTE — Progress Notes (Signed)
Carelink Summary Report / Loop Recorder 

## 2022-03-14 LAB — CUP PACEART REMOTE DEVICE CHECK
Date Time Interrogation Session: 20230606004141
Implantable Pulse Generator Implant Date: 20210526

## 2022-03-15 ENCOUNTER — Ambulatory Visit (INDEPENDENT_AMBULATORY_CARE_PROVIDER_SITE_OTHER)

## 2022-03-15 DIAGNOSIS — R55 Syncope and collapse: Secondary | ICD-10-CM | POA: Diagnosis not present

## 2022-04-05 ENCOUNTER — Emergency Department (HOSPITAL_COMMUNITY)

## 2022-04-05 ENCOUNTER — Observation Stay (HOSPITAL_COMMUNITY)

## 2022-04-05 ENCOUNTER — Observation Stay (HOSPITAL_COMMUNITY)
Admission: EM | Admit: 2022-04-05 | Discharge: 2022-04-05 | Disposition: A | Discharge status: Hospice | Attending: Internal Medicine | Admitting: Internal Medicine

## 2022-04-05 ENCOUNTER — Encounter: Payer: Self-pay | Admitting: Internal Medicine

## 2022-04-05 ENCOUNTER — Encounter (HOSPITAL_COMMUNITY): Payer: Self-pay

## 2022-04-05 ENCOUNTER — Other Ambulatory Visit: Payer: Self-pay

## 2022-04-05 DIAGNOSIS — Z96652 Presence of left artificial knee joint: Secondary | ICD-10-CM | POA: Diagnosis not present

## 2022-04-05 DIAGNOSIS — Z79899 Other long term (current) drug therapy: Secondary | ICD-10-CM | POA: Insufficient documentation

## 2022-04-05 DIAGNOSIS — J9601 Acute respiratory failure with hypoxia: Secondary | ICD-10-CM | POA: Diagnosis present

## 2022-04-05 DIAGNOSIS — J9621 Acute and chronic respiratory failure with hypoxia: Principal | ICD-10-CM | POA: Diagnosis present

## 2022-04-05 DIAGNOSIS — Z8673 Personal history of transient ischemic attack (TIA), and cerebral infarction without residual deficits: Secondary | ICD-10-CM | POA: Diagnosis not present

## 2022-04-05 DIAGNOSIS — G20 Parkinson's disease: Secondary | ICD-10-CM | POA: Insufficient documentation

## 2022-04-05 DIAGNOSIS — Z8546 Personal history of malignant neoplasm of prostate: Secondary | ICD-10-CM | POA: Diagnosis not present

## 2022-04-05 DIAGNOSIS — Z955 Presence of coronary angioplasty implant and graft: Secondary | ICD-10-CM | POA: Insufficient documentation

## 2022-04-05 DIAGNOSIS — Z7901 Long term (current) use of anticoagulants: Secondary | ICD-10-CM | POA: Diagnosis not present

## 2022-04-05 DIAGNOSIS — R0602 Shortness of breath: Secondary | ICD-10-CM | POA: Diagnosis present

## 2022-04-05 DIAGNOSIS — C3492 Malignant neoplasm of unspecified part of left bronchus or lung: Secondary | ICD-10-CM | POA: Diagnosis not present

## 2022-04-05 DIAGNOSIS — J449 Chronic obstructive pulmonary disease, unspecified: Secondary | ICD-10-CM | POA: Diagnosis present

## 2022-04-05 DIAGNOSIS — I480 Paroxysmal atrial fibrillation: Secondary | ICD-10-CM | POA: Diagnosis not present

## 2022-04-05 DIAGNOSIS — Z87891 Personal history of nicotine dependence: Secondary | ICD-10-CM | POA: Diagnosis not present

## 2022-04-05 DIAGNOSIS — Z95818 Presence of other cardiac implants and grafts: Secondary | ICD-10-CM | POA: Diagnosis not present

## 2022-04-05 DIAGNOSIS — I2510 Atherosclerotic heart disease of native coronary artery without angina pectoris: Secondary | ICD-10-CM | POA: Diagnosis not present

## 2022-04-05 DIAGNOSIS — I10 Essential (primary) hypertension: Secondary | ICD-10-CM | POA: Insufficient documentation

## 2022-04-05 DIAGNOSIS — J90 Pleural effusion, not elsewhere classified: Secondary | ICD-10-CM | POA: Diagnosis present

## 2022-04-05 DIAGNOSIS — I4892 Unspecified atrial flutter: Secondary | ICD-10-CM

## 2022-04-05 LAB — BLOOD GAS, VENOUS
Acid-base deficit: 0.7 mmol/L (ref 0.0–2.0)
Bicarbonate: 24.2 mmol/L (ref 20.0–28.0)
O2 Saturation: 100 %
Patient temperature: 37.1
pCO2, Ven: 40 mmHg — ABNORMAL LOW (ref 44–60)
pH, Ven: 7.39 (ref 7.25–7.43)
pO2, Ven: 113 mmHg — ABNORMAL HIGH (ref 32–45)

## 2022-04-05 LAB — PHOSPHORUS: Phosphorus: 4.4 mg/dL (ref 2.5–4.6)

## 2022-04-05 LAB — CBC WITH DIFFERENTIAL/PLATELET
Abs Immature Granulocytes: 0.54 10*3/uL — ABNORMAL HIGH (ref 0.00–0.07)
Basophils Absolute: 0.1 10*3/uL (ref 0.0–0.1)
Basophils Relative: 1 %
Eosinophils Absolute: 0.3 10*3/uL (ref 0.0–0.5)
Eosinophils Relative: 2 %
HCT: 32.2 % — ABNORMAL LOW (ref 39.0–52.0)
Hemoglobin: 10.7 g/dL — ABNORMAL LOW (ref 13.0–17.0)
Immature Granulocytes: 4 %
Lymphocytes Relative: 3 %
Lymphs Abs: 0.4 10*3/uL — ABNORMAL LOW (ref 0.7–4.0)
MCH: 31.1 pg (ref 26.0–34.0)
MCHC: 33.2 g/dL (ref 30.0–36.0)
MCV: 93.6 fL (ref 80.0–100.0)
Monocytes Absolute: 1.1 10*3/uL — ABNORMAL HIGH (ref 0.1–1.0)
Monocytes Relative: 8 %
Neutro Abs: 10.5 10*3/uL — ABNORMAL HIGH (ref 1.7–7.7)
Neutrophils Relative %: 82 %
Platelets: 229 10*3/uL (ref 150–400)
RBC: 3.44 MIL/uL — ABNORMAL LOW (ref 4.22–5.81)
RDW: 15.5 % (ref 11.5–15.5)
WBC: 12.9 10*3/uL — ABNORMAL HIGH (ref 4.0–10.5)
nRBC: 0 % (ref 0.0–0.2)

## 2022-04-05 LAB — MAGNESIUM: Magnesium: 1.6 mg/dL — ABNORMAL LOW (ref 1.7–2.4)

## 2022-04-05 LAB — AMMONIA: Ammonia: 31 umol/L (ref 9–35)

## 2022-04-05 LAB — COMPREHENSIVE METABOLIC PANEL
ALT: 18 U/L (ref 0–44)
AST: 38 U/L (ref 15–41)
Albumin: 2.4 g/dL — ABNORMAL LOW (ref 3.5–5.0)
Alkaline Phosphatase: 70 U/L (ref 38–126)
Anion gap: 10 (ref 5–15)
BUN: 13 mg/dL (ref 8–23)
CO2: 24 mmol/L (ref 22–32)
Calcium: 8.7 mg/dL — ABNORMAL LOW (ref 8.9–10.3)
Chloride: 87 mmol/L — ABNORMAL LOW (ref 98–111)
Creatinine, Ser: 0.47 mg/dL — ABNORMAL LOW (ref 0.61–1.24)
GFR, Estimated: >60 mL/min (ref >60)
Glucose, Bld: 90 mg/dL (ref 70–99)
Potassium: 4.5 mmol/L (ref 3.5–5.1)
Sodium: 121 mmol/L — ABNORMAL LOW (ref 135–145)
Total Bilirubin: 0.9 mg/dL (ref 0.3–1.2)
Total Protein: 5.7 g/dL — ABNORMAL LOW (ref 6.5–8.1)

## 2022-04-05 LAB — TROPONIN I (HIGH SENSITIVITY)
Troponin I (High Sensitivity): 61 ng/L — ABNORMAL HIGH (ref <18)
Troponin I (High Sensitivity): 52 ng/L — ABNORMAL HIGH (ref <18)

## 2022-04-05 LAB — PROTIME-INR
INR: 1.3 — ABNORMAL HIGH (ref 0.8–1.2)
Prothrombin Time: 16.2 seconds — ABNORMAL HIGH (ref 11.4–15.2)

## 2022-04-05 LAB — OSMOLALITY: Osmolality: 254 mOsm/kg — ABNORMAL LOW (ref 275–295)

## 2022-04-05 LAB — BRAIN NATRIURETIC PEPTIDE: B Natriuretic Peptide: 259.4 pg/mL — ABNORMAL HIGH (ref 0.0–100.0)

## 2022-04-05 MED ORDER — MORPHINE SULFATE (PF) 4 MG/ML IV SOLN
4.0000 mg | Freq: Once | INTRAVENOUS | Status: AC
  Administered 2022-04-05: 4 mg via INTRAVENOUS
  Filled 2022-04-05: qty 1

## 2022-04-05 MED ORDER — AMIODARONE HCL IN DEXTROSE 360-4.14 MG/200ML-% IV SOLN
30.0000 mg/h | INTRAVENOUS | Status: DC
  Administered 2022-04-05: 30 mg/h via INTRAVENOUS
  Filled 2022-04-05: qty 200

## 2022-04-05 MED ORDER — ONDANSETRON HCL 4 MG/2ML IJ SOLN
4.0000 mg | Freq: Once | INTRAMUSCULAR | Status: AC
Start: 2022-04-05 — End: 2022-04-05
  Administered 2022-04-05: 4 mg via INTRAVENOUS
  Filled 2022-04-05: qty 2

## 2022-04-05 MED ORDER — AMIODARONE HCL IN DEXTROSE 360-4.14 MG/200ML-% IV SOLN
60.0000 mg/h | INTRAVENOUS | Status: AC
  Administered 2022-04-05 (×3): 60 mg/h via INTRAVENOUS
  Filled 2022-04-05: qty 200

## 2022-04-05 MED ORDER — SODIUM CHLORIDE 0.9 % IV SOLN
500.0000 mg | Freq: Once | INTRAVENOUS | Status: AC
  Administered 2022-04-05: 500 mg via INTRAVENOUS
  Filled 2022-04-05: qty 5

## 2022-04-05 MED ORDER — HYDROMORPHONE HCL 1 MG/ML IJ SOLN
1.0000 mg | Freq: Once | INTRAMUSCULAR | Status: AC
  Administered 2022-04-05: 1 mg via INTRAVENOUS
  Filled 2022-04-05: qty 1

## 2022-04-05 MED ORDER — MORPHINE SULFATE (PF) 2 MG/ML IV SOLN
1.0000 mg | Freq: Once | INTRAVENOUS | Status: AC
  Administered 2022-04-05: 1 mg via INTRAVENOUS
  Filled 2022-04-05: qty 1

## 2022-04-05 MED ORDER — LORAZEPAM 2 MG/ML IJ SOLN
0.2500 mg | Freq: Once | INTRAMUSCULAR | Status: AC
Start: 2022-04-05 — End: 2022-04-05
  Administered 2022-04-05: 0.25 mg via INTRAVENOUS
  Filled 2022-04-05: qty 1

## 2022-04-05 MED ORDER — IOHEXOL 350 MG/ML SOLN
80.0000 mL | Freq: Once | INTRAVENOUS | Status: AC | PRN
  Administered 2022-04-05: 80 mL via INTRAVENOUS

## 2022-04-05 MED ORDER — SODIUM CHLORIDE 0.9 % IV SOLN
1.0000 g | Freq: Once | INTRAVENOUS | Status: AC
  Administered 2022-04-05: 1 g via INTRAVENOUS
  Filled 2022-04-05: qty 10

## 2022-04-05 MED ORDER — LIDOCAINE HCL 1 % IJ SOLN
INTRAMUSCULAR | Status: AC
  Filled 2022-04-05: qty 20

## 2022-04-05 MED ORDER — HEPARIN SOD (PORK) LOCK FLUSH 100 UNIT/ML IV SOLN
500.0000 [IU] | Freq: Once | INTRAVENOUS | Status: AC
  Administered 2022-04-05: 500 [IU]
  Filled 2022-04-05: qty 5

## 2022-04-05 MED ORDER — AMIODARONE LOAD VIA INFUSION
150.0000 mg | Freq: Once | INTRAVENOUS | Status: AC
  Administered 2022-04-05: 150 mg via INTRAVENOUS
  Filled 2022-04-05: qty 83.34

## 2022-04-05 NOTE — ED Notes (Signed)
Wife called hospice RN before calling EMS, they advised pt to come to ER for further evaluation as hospice explained to wife "we done all we can on our part" per EMS report.

## 2022-04-05 NOTE — ED Notes (Signed)
Dr. Matilde Sprang aware of ST on the monitor, rates 130-140s. No verbal orders at this time

## 2022-04-05 NOTE — Discharge Instructions (Signed)
Discharge to [facility] for further care to be around family.  Defer stopping medications to other providers at [facility]

## 2022-04-05 NOTE — ED Notes (Signed)
Spoke with Pamala Hurry, Spouse, on the phone. [name] states she and the family would like to hold off on any aggressive/ invasive treatment including the thoracentesis. Patient transferred back to room. MD notified.

## 2022-04-05 NOTE — ED Notes (Signed)
Dr. Jairo Ben at the bedside

## 2022-04-05 NOTE — Progress Notes (Signed)
Carelink Summary Report / Loop Recorder 

## 2022-04-05 NOTE — ED Notes (Signed)
Pt was soiled on arrival, pt was cleaned from having a BM. Clean diaper applied and clean bed linens applied as well.

## 2022-04-05 NOTE — ED Provider Notes (Signed)
[facility] DEPT Provider Note  CSN: [id number] Arrival date & time: 04/05/22 0359  Chief Complaint(s) Shortness of Breath  HPI Corey [name] is a 76 y.o. male with PMH paroxysmal A-fib, stage IV non-small cell lung cancer, chronic respiratory failure on home oxygen, HTN, HLD, CAD who presents emergency department for evaluation of shortness of breath.  Patient is on hospice with a DNR in place but still would want hospital admission based on clarification of CODE STATUS.  History obtained from patient and his wife who states that this morning, he had acute worsening of his shortness of breath and an ambulance was called to bring him to the emergency department.  Here in the emergency department, patient states that he felt his shortness of breath has been worsening over the last few days with more frequent cough and wheezing.  He currently denies abdominal pain, nausea, vomiting, fever or other systemic symptoms.    Past Medical History Past Medical History:  Diagnosis Date   Anemia    Anxiety    Arthritis    "back, right knee, hands, ankles, neck" (05/31/2016)   Atrial fibrillation with RVR ([place]) 11/03/2021   Carotid artery disease (HCC)    Chronic lower back pain    COPD with acute exacerbation ([place]) 04/02/2021   Coronary atherosclerosis of native coronary artery    a. BMS to [facility] 2004 and 2007, otherwise mild nonobstructive disease. EF normal.   Diverticulitis    Dyslipidemia    Essential hypertension, benign    Family history of breast cancer    Family history of lung cancer    Family history of prostate cancer    GERD (gastroesophageal reflux disease)    Hyponatremia 06/04/2016   Lumbar radiculopathy, chronic 02/04/2015   Right L5   Lung cancer ([place])    Mild dilation of ascending aorta ([place]) 08/2021   Obesity    OSA on CPAP    uses cpap   Parkinson's disease ([place])    Paroxysmal atrial fibrillation ([place])    a. Discovered after stroke.    Pleural effusion on left 02/24/2021   Pneumonia 01/1996   PONV (postoperative nausea and vomiting)    Prostate CA ([place])    Stroke ([place]) 07/2012   no deficits   Symptomatic anemia    Visit for monitoring Tikosyn therapy 09/18/2019   Patient Active Problem List   Diagnosis Date Noted   Hospice care patient 12/26/2021   Constipation 12/22/2021   Multifocal pneumonia 12/21/2021   Acute on chronic respiratory failure with hypoxia ([place]) 12/20/2021   COPD (chronic obstructive pulmonary disease) ([place]) 11/21/2021   Chronic respiratory failure with hypoxia 11/21/2021   Paroxysmal atrial fibrillation with RVR ([place]) 11/21/2021   Pressure injury of skin 11/16/2021   Dysarthria 11/10/2021   Stasis dermatitis 11/06/2021   Prolonged Q-T interval on ECG 11/04/2021   Macrocytic anemia 11/03/2021   Streptococcal sore throat    Acute on chronic anemia    Supplemental oxygen dependent    Notalgia    Adjustment disorder with mixed anxiety and depressed mood    Physical deconditioning 08/12/2021   Malaise 07/28/2021   Port-A-Cath in place 04/27/2021   Genetic testing 46/27/0350   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   CAP (community acquired pneumonia) 04/01/2021   Family history of prostate cancer 03/20/2021   Family history of breast cancer 03/20/2021   Family history of lung cancer 03/20/2021   Goals of care, counseling/discussion 03/18/2021   Encounter for antineoplastic chemotherapy 03/18/2021  Encounter for antineoplastic immunotherapy 03/18/2021   Primary cancer of left lower lobe of lung ([place]) 03/07/2021   Non-small cell carcinoma of left lung, stage 4 ([place]) 03/05/2021   Malnutrition of moderate degree 02/25/2021   Bilateral pleural effusion 02/24/2021   Hilar mass    Reactive airway disease 11/20/2020   Allergic rhinitis with non-allergic component 09/18/2020   Heartburn 09/18/2020   Malignant neoplasm of prostate ([place]) 03/04/2020   Pre-syncope 02/27/2020   Cerebral  embolism with transient ischemic attack (TIA) 02/11/2020   History of stroke 42/35/3614   Embolic stroke involving cerebral artery ([place]) 02/11/2020   Secondary hypercoagulable state ([place]) 09/10/2019   Essential tremor 06/25/2019   Morbid obesity ([place]) 05/27/2017   Carotid artery disease ([place]) 08/30/2016   Essential hypertension 08/30/2016   Iron deficiency 01/05/2016   Lumbar radiculopathy, chronic 02/04/2015   SVT (supraventricular tachycardia) ([place]) 12/03/2014   PAC (premature atrial contraction) 04/05/2014   OSA on CPAP 03/27/2014   AF (paroxysmal atrial fibrillation) ([place]) 01/05/2013   CVA (cerebral infarction) 07/06/2012   Coronary artery disease    HLD (hyperlipidemia)    Shortness of breath    GERD (gastroesophageal reflux disease)    Home Medication(s) Prior to Admission medications   Medication Sig Start Date End Date Taking? Authorizing Provider  acetaminophen (TYLENOL) 325 MG tablet Take 2 tablets (650 mg total) by mouth every 6 (six) hours as needed for mild pain (or Fever >/= 101). Patient taking differently: Take 650 mg by mouth in the morning and at bedtime. 08/25/21   Angiulli, Lavon Paganini, PA-C  albuterol (PROVENTIL) (2.5 MG/3ML) 0.083% nebulizer solution Inhale 3 mLs into the lungs every 4 (four) hours as needed for wheezing or shortness of breath. Patient taking differently: Inhale 3 mLs into the lungs See admin instructions. Nebulize 2.5 mg & inhale into the lungs 3-4 times a day and may also use an additional 2.5 mg up to two times a day as needed for shortness of breath 08/28/21   Angiulli, Lavon Paganini, PA-C  ALPRAZolam Duanne Moron) 0.5 MG tablet Take 1 tablet (0.5 mg total) by mouth at bedtime as needed for anxiety. 11/26/21   Aline August, MD  amiodarone (PACERONE) 200 MG tablet TAKE 1 TABLET BY MOUTH EVERY DAY 02/17/22   Fenton, Clint R, PA  benzonatate (TESSALON) 200 MG capsule Take 1 capsule (200 mg total) by mouth 3 (three) times daily. Patient taking differently: Take  200 mg by mouth in the morning, at noon, and at bedtime. 10/07/21   Heilingoetter, Cassandra L, PA-C  budesonide (PULMICORT) 0.5 MG/2ML nebulizer solution Take 2 mLs (0.5 mg total) by nebulization 2 (two) times daily. Patient taking differently: Take 0.5 mg by nebulization in the morning and at bedtime. 09/25/21   Icard, Octavio Graves, DO  carbidopa-levodopa (SINEMET IR) 25-100 MG tablet Take 1 tablet by mouth See admin instructions. Take 1 tablet by mouth at 8 AM, 1 PM, 7 PM, and bedtime (10 PM) Patient taking differently: Take 1 tablet by mouth 4 (four) times daily. Take 1 tablet by mouth at 8 AM, 1 PM, 7 PM, and bedtime (10 PM) 11/26/21   Aline August, MD  Dextromethorphan-Menthol (DELSYM COUGH RELIEF MT) Use as directed 10 mLs in the mouth or throat at bedtime as needed (for cough).    [provider]  diltiazem (CARDIZEM CD) 360 MG 24 hr capsule Take 1 capsule (360 mg total) by mouth daily. 11/27/21   Aline August, MD  fenofibrate 160 MG tablet TAKE 1 TABLET DAILY.  PLEASE KEEP UPCOMING APPT IN MARCH WITH DR. Acie Fredrickson BEFORE ANYMORE REFILLS. Patient taking differently: Take 160 mg by mouth See admin instructions. Take 160 mg by mouth at 2 PM daily 08/25/21   Angiulli, Lavon Paganini, PA-C  fluticasone [facility]) 50 MCG/ACT nasal spray Place 2 sprays into both nostrils daily. 08/25/21   Angiulli, Lavon Paganini, PA-C  folic acid (FOLVITE) 1 MG tablet Take 1 tablet (1 mg total) by mouth daily. 08/25/21   Angiulli, Lavon Paganini, PA-C  furosemide (LASIX) 40 MG tablet Take 1 tablet (40 mg total) by mouth daily. 12/27/21   Dessa Phi, DO  guaiFENesin (MUCINEX) 600 MG 12 hr tablet Take 2 tablets (1,200 mg total) by mouth 2 (two) times daily. 08/12/21   Kathie Dike, MD  HYDROcodone bit-homatropine (HYCODAN) 5-1.5 MG/5ML syrup Take 5 mLs by mouth every 6 (six) hours as needed for cough. 01/27/22   Curt Bears, MD  ipratropium (ATROVENT) 0.03 % nasal spray Place 1-2 sprays into both nostrils every 12 (twelve)  hours. For drainage Patient taking differently: Place 2 sprays into both nostrils in the morning. 08/28/21   Angiulli, Lavon Paganini, PA-C  levalbuterol (XOPENEX) 0.63 MG/3ML nebulizer solution Take 3 mLs (0.63 mg total) by nebulization 2 (two) times daily. 09/25/21   Icard, Octavio Graves, DO  levocetirizine (XYZAL) 5 MG tablet Take 1 tablet (5 mg total) by mouth every evening. 12/16/21   Garnet Sierras, DO  loratadine (CLARITIN) 10 MG tablet Take 1 tablet (10 mg total) by mouth daily as needed for allergies (nasal drainage). Patient taking differently: Take 10 mg by mouth daily as needed for allergies. 08/25/21   Angiulli, Lavon Paganini, PA-C  LORazepam (ATIVAN) 0.5 MG tablet Take 0.5 mg by mouth every 6 (six) hours as needed. 03/24/22   [provider]  magnesium oxide (MAG-OX) 400 (240 Mg) MG tablet Take 1 tablet (400 mg total) by mouth daily. 08/25/21   Angiulli, Lavon Paganini, PA-C  methocarbamol (ROBAXIN) 500 MG tablet Take 500 mg by mouth 2 (two) times daily as needed. 03/10/22   [provider]  metoprolol succinate (TOPROL-XL) 25 MG 24 hr tablet Take 1 tablet (25 mg total) by mouth daily. 08/25/21   Angiulli, Lavon Paganini, PA-C  Multiple Vitamins-Minerals (MULTIVITAMINS THER. W/MINERALS) TABS Take 1 tablet by mouth daily with breakfast.    [provider]  nitroGLYCERIN (NITROSTAT) 0.4 MG SL tablet Place 1 tablet (0.4 mg total) under the tongue every 5 (five) minutes as needed for chest pain. 08/25/21   Angiulli, Lavon Paganini, PA-C  omeprazole (PRILOSEC) 20 MG capsule Take 1 capsule (20 mg total) by mouth daily before breakfast. 11/26/21   Aline August, MD  ondansetron (ZOFRAN) 8 MG tablet Take 8 mg by mouth every 8 (eight) hours as needed for nausea or vomiting.    [provider]  oxyCODONE (OXY IR/ROXICODONE) 5 MG immediate release tablet Take 1 tablet (5 mg total) by mouth every 4 (four) hours as needed for moderate pain. 12/26/21   Dessa Phi, DO  polyethylene glycol (MIRALAX /  GLYCOLAX) 17 g packet Take 17 g by mouth daily. 11/26/21   Aline August, MD  potassium chloride (KLOR-CON) 10 MEQ tablet TAKE 1 TABLET BY MOUTH EVERY DAY Patient taking differently: Take 10 mEq by mouth See admin instructions. Take 10 mEq by mouth at 2 PM daily 07/16/21   Nahser, Wonda Cheng, MD  prochlorperazine (COMPAZINE) 10 MG tablet Take 10 mg by mouth every 6 (six) hours as needed. 03/08/22   [provider]  rivaroxaban (XARELTO) 20 MG TABS tablet Take 1 tablet (20 mg total) by mouth daily. Patient taking differently: Take 20 mg by mouth every evening. 08/25/21   Angiulli, Lavon Paganini, PA-C  Saccharomyces boulardii (PROBIOTIC) 250 MG CAPS Take 250 mg by mouth daily.    [provider]  senna-docusate (SENOKOT-S) 8.6-50 MG tablet Take 1 tablet by mouth 2 (two) times daily. 11/20/21   Shelly Coss, MD  sodium chloride (OCEAN) 0.65 % SOLN nasal spray Place 1 spray into both nostrils as needed for congestion.    [provider]  tamsulosin (FLOMAX) 0.4 MG CAPS capsule Take 1 capsule (0.4 mg total) by mouth daily after supper. 08/25/21   Angiulli, Lavon Paganini, PA-C  Tiotropium Bromide-Olodaterol (STIOLTO RESPIMAT) 2.5-2.5 MCG/ACT AERS Inhale 2 puffs into the lungs daily. 09/25/21   Icard, Octavio Graves, DO  topiramate (TOPAMAX) 50 MG tablet Take 1-2 tablets (50-100 mg total) by mouth See admin instructions. Take 79m by mouth in the morning, then 1035mat night 11/26/21   AlAline AugustMD  traZODone (DESYREL) 50 MG tablet Take 50 mg by mouth at bedtime. 04/01/22   [provider]  Vitamin D, Ergocalciferol, (DRISDOL) 1.25 MG (50000 UNIT) CAPS capsule TAKE 1 CAPSULE (50,000 UNITS TOTAL) BY MOUTH EVERY 7 (SEVEN) DAYS. MONDAY 12/23/21   KaBrunetta GeneraMD                                                                                                                                    Past Surgical History Past Surgical History:  Procedure Laterality Date   ADENOIDECTOMY      ANTERIOR CERVICAL DECOMP/DISCECTOMY FUSION  07/2001; 10/2002   "C5-6; C6-7; redo"   BACK SURGERY     CARPAL TUNNEL RELEASE Left 10/2015   CHEST TUBE INSERTION Left 03/17/2021   Procedure: INSERTION PLEURAL DRAINAGE CATHETER;  Surgeon: IcGarner NashDO;  [facility] Service: Pulmonary;  Laterality: Left;  Indwelling Tunneled Pleural catheter (PLEUREX)    COLONOSCOPY W/ POLYPECTOMY  02/2014   CORONARY ANGIOPLASTY WITH STENT PLACEMENT  05/2003; 12/2005   "mid RCA; mid RCA"   FINE NEEDLE ASPIRATION  02/26/2021   Procedure: FINE NEEDLE ASPIRATION (FNA) LINEAR;  Surgeon: SmCandee FurbishMD;  [facility];  Service: Pulmonary;;   HAND SURGERY  02/2019   LEFT HAND   implantable loop recorder placement  02/27/2020   [employer] Reveal [place] [id number]  implantable loop recorder implanted by Dr AlRayann Hemanor afib management and evaluation of presyncope   IR IMAGING GUIDED PORT INSERTION  04/16/2021   JOINT REPLACEMENT     KNEE ARTHROSCOPY Left 10/2005   KNEE ARTHROSCOPY W/ PARTIAL MEDIAL MENISCECTOMY Left 09/2005   LUMBAR LAMINECTOMY/DECOMPRESSION MICRODISCECTOMY  03/2005   "L4-5"   POSTERIOR LUMBAR FUSION  10/2003   L5-S1; "plates, screws"   SHOULDER ARTHROSCOPY Right 08/2011   Debridement of labrum, arthroscopic  distal clavicle excision   SHOULDER OPEN ROTATOR CUFF REPAIR Left 07/2014   TEE WITHOUT CARDIOVERSION  07/07/2012   Procedure: TRANSESOPHAGEAL ECHOCARDIOGRAM (TEE);  Surgeon: Fay Records, MD;  Location: [facility] ENDOSCOPY;  Service: Cardiovascular;  Laterality: N/A;   THORACENTESIS N/A 02/25/2021   Procedure: Mathews Robinsons;  Surgeon: Juanito Doom, MD;  Location: [facility];  Service: Cardiopulmonary;  Laterality: N/A;   TONSILLECTOMY AND ADENOIDECTOMY  ~ 1956   TOTAL KNEE ARTHROPLASTY Left 10/2006   TRIGGER FINGER RELEASE Left 10/2015   VIDEO BRONCHOSCOPY WITH ENDOBRONCHIAL ULTRASOUND Left 02/26/2021   Procedure: VIDEO BRONCHOSCOPY WITH ENDOBRONCHIAL  ULTRASOUND;  Surgeon: Candee Furbish, MD;  Location[facility] ENDOSCOPY;  Service: Pulmonary;  Laterality: Left;  cryoprobe too thanks!   Family History Family History  Problem Relation Age of Onset   Hypertension Mother    Heart disease Father    Heart attack Father    Prostate cancer Brother 105   Parkinson's disease Brother    Lung cancer Paternal Aunt        hx of smoking   Breast cancer Paternal Grandmother        dx 68s, bilateral mastectomies   Heart attack Paternal Grandfather    Blindness Son    Colon cancer Neg Hx    Pancreatic cancer Neg Hx     Social History Social History   Tobacco Use   Smoking status: Former    Packs/day: 1.00    Years: 34.00    Total pack years: 34.00    Types: Cigarettes    Quit date: 05/05/2003    Years since quitting: 18.9    Passive exposure: Never   Smokeless tobacco: Never  Vaping Use   Vaping Use: Never used  Substance Use Topics   Alcohol use: No    Alcohol/week: 0.0 standard drinks of alcohol   Drug use: No   Allergies Amoxicillin, Fosinopril, Hydromorphone, Ampicillin, Codeine, and Rosuvastatin  Review of Systems Review of Systems  Respiratory:  Positive for cough, shortness of breath and wheezing.     Physical Exam Vital Signs  I have reviewed the triage vital signs BP 118/68 (BP Location: Left Arm)   Pulse (!) 130   Temp 98.2 F (36.8 C) (Oral)   Resp 18   Ht 6' 2"  (1.88 m)   Wt 96.2 kg   SpO2 98%   BMI 27.22 kg/m   Physical Exam Constitutional:      General: He is not in acute distress.    Appearance: Normal appearance.  HENT:     Head: Normocephalic and atraumatic.     Nose: No congestion or rhinorrhea.  Eyes:     General:        Right eye: No discharge.        Left eye: No discharge.     Extraocular Movements: Extraocular movements intact.     Pupils: Pupils are equal, round, and reactive to light.  Cardiovascular:     Rate and Rhythm: Normal rate and regular rhythm.     Heart sounds: No murmur  heard. Pulmonary:     Effort: No respiratory distress.     Breath sounds: Examination of the left-upper field reveals decreased breath sounds. Examination of the left-middle field reveals decreased breath sounds. Examination of the left-lower field reveals decreased breath sounds. Decreased breath sounds present. No wheezing or rales.  Abdominal:     General: There is no distension.     Tenderness: There is no abdominal tenderness.  Musculoskeletal:  General: Normal range of motion.     Cervical back: Normal range of motion.  Skin:    General: Skin is warm and dry.  Neurological:     General: No focal deficit present.     Mental Status: He is alert.     ED Results and Treatments Labs (all labs ordered are listed, but only abnormal results are displayed) Labs Reviewed - No data to display                                                                                                                        Radiology No results found.  Pertinent labs & imaging results that were available during my care of the patient were reviewed by me and considered in my medical decision making (see MDM for details).  Medications Ordered in ED Medications - No data to display                                                                                                                                   Procedures .Critical Care  Performed by: Teressa Lower, MD Authorized by: Teressa Lower, MD   Critical care provider statement:    Critical care time (minutes):  30   Critical care was necessary to treat or prevent imminent or life-threatening deterioration of the following conditions:  Respiratory failure and cardiac failure   Critical care was time spent personally by me on the following activities:  Development of treatment plan with patient or surrogate, discussions with consultants, evaluation of patient's response to treatment, examination of patient, ordering and review of  laboratory studies, ordering and review of radiographic studies, ordering and performing treatments and interventions, pulse oximetry, re-evaluation of patient's condition and review of old charts   (including critical care time)  Medical Decision Making / ED Course   This patient presents to the ED for concern of shortness of breath, this involves an extensive number of treatment options, and is a complaint that carries with it a high risk of complications and morbidity.  The differential diagnosis includes progression of underlying lung cancer, pleural effusion, PE, PTX, pneumonia  MDM: Patient seen in the emergency room for evaluation of shortness of breath.  Physical exam reveals decreased breath sounds on the left, and irregular tachycardia but is otherwise unremarkable.  Laboratory evaluation with leukocytosis to 12.9, hemoglobin 10.7, pH 7.39 with no hypercarbia, BNP elevated to 259.4, CMP  with a significant hyponatremia to 121, hypochloremia to 87, high-sensitivity troponin elevated to 61.  Chest x-ray with near-total whiteout on the left that is either nonresolution or worsening of previous x-ray findings.  CT PE was obtained pleat opacification of the left hemithorax worrisome for progression of known malignancy plus minus progressive mucous plugging in the left mainstem bronchus, worsening right-sided pleural effusion, worsening pericardial effusion, new upper abdominal ascites with concern for possible peritoneal carcinomatosis, worsening liver metastases.  I spoke with the PCCM night float attending he states that given patient's current goals of care, there needs to be hospitalist discussion with palliative care about exactly how far they want to go with this care and they are happy to consult as needed.  Patient empirically covered for CAP with ceftriaxone azithromycin.  Patient with a flutter 2-1 with rates in the 140s patient started on amiodarone bolus and drip.  Patient then admitted to  the hospitalist service   Additional history obtained:  -External records from outside source obtained and reviewed including: Chart review including previous notes, labs, imaging, consultation notes   Lab Tests: -I ordered, reviewed, and interpreted labs.   The pertinent results include:   Labs Reviewed - No data to display    EKG   EKG Interpretation  Date/Time:  Monday April 05 2022 04:35:08 EDT Ventricular Rate:  141 PR Interval:    QRS Duration: 102 QT Interval:  345 QTC Calculation: 521 R Axis:   -35 Text Interpretation: Atrial flutter Prolonged QT interval Confirmed by Channing Savich ([id number]) on 04/05/2022 5:54:27 AM         Imaging Studies ordered: I ordered imaging studies including chest x-ray, CT PE I independently visualized and interpreted imaging. I agree with the radiologist interpretation   Medicines ordered and prescription drug management: No orders of the defined types were placed in this encounter.   -I have reviewed the patients home medicines and have made adjustments as needed  Critical interventions Amiodarone, supplemental oxygen, multiple antibiotics  Consultations Obtained: I requested consultation with the PCCM attending,  and discussed lab and imaging findings as well as pertinent plan - they recommend: Hospitalist admission for goals of care discussion   Cardiac Monitoring: The patient was maintained on a cardiac monitor.  I personally viewed and interpreted the cardiac monitored which showed an underlying rhythm of: A flutter with RVR  Social Determinants of Health:  Factors impacting patients care include: none   Reevaluation: After the interventions noted above, I reevaluated the patient and found that they have :stayed the same  Co morbidities that complicate the patient evaluation  Past Medical History:  Diagnosis Date   Anemia    Anxiety    Arthritis    "back, right knee, hands, ankles, neck" (05/31/2016)   Atrial  fibrillation with RVR ([place]) 11/03/2021   Carotid artery disease (HCC)    Chronic lower back pain    COPD with acute exacerbation ([place]) 04/02/2021   Coronary atherosclerosis of native coronary artery    a. BMS to [facility] 2004 and 2007, otherwise mild nonobstructive disease. EF normal.   Diverticulitis    Dyslipidemia    Essential hypertension, benign    Family history of breast cancer    Family history of lung cancer    Family history of prostate cancer    GERD (gastroesophageal reflux disease)    Hyponatremia 06/04/2016   Lumbar radiculopathy, chronic 02/04/2015   Right L5   Lung cancer (HCC)    Mild dilation of ascending aorta (  [place]) 08/2021   Obesity    OSA on CPAP    uses cpap   Parkinson's disease ([place])    Paroxysmal atrial fibrillation ([place])    a. Discovered after stroke.   Pleural effusion on left 02/24/2021   Pneumonia 01/1996   PONV (postoperative nausea and vomiting)    Prostate CA ([place])    Stroke ([place]) 07/2012   no deficits   Symptomatic anemia    Visit for monitoring Tikosyn therapy 09/18/2019      Dispostion: I considered admission for this patient, and given worsening metastatic disease, a flutter with RVR, need for possible palliative thoracentesis patient will require admission     Final Clinical Impression(s) / ED Diagnoses Final diagnoses:  None     @PCDICTATION @    Teressa Lower, MD 04/05/22 [phone]

## 2022-04-05 NOTE — ED Notes (Signed)
Pt has return from [place]

## 2022-04-05 NOTE — H&P (Signed)
History and Physical    Corey [name] XTA:[id number] DOB: [birth year] DOA: 04/05/2022  PCP: [name], MD Patient coming from: cares of hospice, Chief Complaint: increasing sob from baseline, on hospice  HPI: Corey [name] is a 76 y.o. male with a pertinent history of  paroxysmal A-fib, stage IV non-small cell lung cancer, chronic respiratory failure on home oxygen, COPD, HTN, HLD, CAD who presents emergency department for evaluation of shortness of breath.  Patient is on hospice with a DNR in place but still would want hospital admission based on clarification of CODE STATUS.  History obtained from patient and his wife who states that this morning, he had acute worsening of his shortness of breath and an ambulance was called to bring him to the emergency department.  Talks with wife noticed increasing sob and started morphine at home but last night was very tough where patient was awake all night.  It seems he has made comments that he is ready to pass away and family has acceptance of this.  We agree that we would not want to do procedures and instead focus on making him comfortable.  In the ED, initially, his heart rate was 148 and patient was started on amiodarone.,  Blood pressure typically around 120/79, RR initially elevated to 29 but improved to 18-20, patient was placed on oxygen and in the room was satting 99 to 100% on 4 L oxygen I decreased it to 2.5, WBC 12.9, Hgb 10.7, PLT 11/06/2027, NA 121, K4.5, CO2 24, CL 87, SCR 0.47, troponin 61 then 52.  BNP is 259.4 up from 4 to 5 months ago which was 1 60-1 87. VBG showed 7.39 pH and 48 PCO2, PO2 on venous was 113 -EKG shows ventricular rate to 140s, prolonged QTc - Chest x-ray shows at least small left pleural effusion with small right pleural effusion with overlying atelectasis or infiltrates, possibly continued ongoing or recurrent opacification of the left hemothorax with volume loss - CTA PE shows no acute evidence of acute pulmonary  embolism with complete opacification of the left hemothorax which has progressed with concern for progression and progressive mucous plugging within the left mainstem bronchus Suctioninig, started on amiodarone  At bedside, patient is arousable but looks quite weak but can answer some questions softly.  he follows commands   Review of Systems: As per HPI otherwise 10 point review of systems negative.  Other pertinents as below:  General - unable to perform full ROS because of patients mental status but he can answer simple questions   Past Medical History:  Diagnosis Date   Anemia    Anxiety    Arthritis    "back, right knee, hands, ankles, neck" (05/31/2016)   Atrial fibrillation with RVR ([place]) 11/03/2021   Carotid artery disease (HCC)    Chronic lower back pain    COPD with acute exacerbation ([place]) 04/02/2021   Coronary atherosclerosis of native coronary artery    a. BMS to [employer] 2004 and 2007, otherwise mild nonobstructive disease. EF normal.   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 Discovered after stroke.   Pleural effusion on left 02/24/2021   Pneumonia 01/1996   PONV (postoperative nausea and vomiting)    Prostate CA ([place])    Stroke ([place]) 07/2012   no deficits   Symptomatic anemia    Visit for monitoring Tikosyn therapy 09/18/2019    Past Surgical History:  Procedure Laterality Date   ADENOIDECTOMY     ANTERIOR CERVICAL DECOMP/DISCECTOMY FUSION  07/2001; 10/2002   "C5-6; C6-7; redo"   BACK SURGERY     CARPAL TUNNEL RELEASE Left  10/2015   CHEST TUBE INSERTION Left 03/17/2021   Procedure: INSERTION PLEURAL DRAINAGE CATHETER;  Surgeon: Garner Nash, DO;  Location: [facility];  Service: Pulmonary;  Laterality: Left;  Indwelling Tunneled Pleural catheter (PLEUREX)    COLONOSCOPY W/ POLYPECTOMY  02/2014   CORONARY ANGIOPLASTY WITH STENT PLACEMENT  05/2003; 12/2005   "mid RCA; mid RCA"   FINE NEEDLE ASPIRATION  02/26/2021   Procedure: FINE NEEDLE ASPIRATION (FNA) LINEAR;  Surgeon: Candee Furbish, MD;  [facility] END[facility]COPY;  Service: Pulmonary;;   HAND SURGERY  02/2019   LEFT HAND   implantable loop recorder placement  02/27/2020   Medtronic Reveal Point model [id number]  implantable loop recorder implanted by Dr Rayann Heman for afib management and evaluation of presyncope   IR IMAGING GUIDED PORT INSERTION  04/16/2021   JOINT REPLACEMENT     KNEE ARTHROSCOPY Left 10/2005   KNEE ARTHROSCOPY W/ PARTIAL MEDIAL MENISCECTOMY Left 09/2005   LUMBAR LAMINECTOMY/DECOMPRESSION MICRODISCECTOMY  03/2005   "L4-5"   POSTERIOR LUMBAR FUSION  10/2003   L5-S1; "plates, screws"   SHOULDER ARTHROSCOPY Right 08/2011   Debridement of labrum, arthroscopic distal clavicle excision   SHOULDER OPEN ROTATOR CUFF REPAIR Left 07/2014   TEE WITHOUT CARDIOVERSION  07/07/2012   Procedure: TRANSESOPHAGEAL ECHOCARDIOGRAM (TEE);  Surgeon: Fay Records, MD;  Location: [facility] ENDOSCOPY;  Service: Cardiovascular;  Laterality: N/A;   THORACENTESIS N/A 02/25/2021   Procedure: Mathews Robinsons;  Surgeon: Juanito Doom, MD;  Location: [facility];  Service: Cardiopulmonary;  Laterality: N/A;   TONSILLECTOMY AND ADENOIDECTOMY  ~ 1956   TOTAL KNEE ARTHROPLASTY Left 10/2006   TRIGGER FINGER RELEASE Left 10/2015   VIDEO BRONCHOSCOPY WITH ENDOBRONCHIAL ULTRASOUND Left 02/26/2021   Procedure: VIDEO BRONCHOSCOPY WITH ENDOBRONCHIAL ULTRASOUND;  Surgeon: Candee Furbish, MD;  Location: [facility] ENDOSCOPY;  Service: Pulmonary;  Laterality: Left;  cryoprobe too thanks!      reports that he quit smoking about 18 years ago. His smoking use included cigarettes. He has a 34.00 pack-year smoking history. He has never been exposed to tobacco smoke. He has never used smokeless tobacco. He reports that he does not drink alcohol and does not use drugs.  Allergies  Allergen Reactions   Amoxicillin Anaphylaxis and Swelling    Tolerates Rocephin   Fosinopril Other (See Comments) and Cough    Muscle aches (Monopril)   Hydromorphone Nausea And Vomiting   Ampicillin Rash   Codeine Nausea Only and Other (See Comments)    Heavy amounts cause nausea and stomach upset   Rosuvastatin Other (See Comments)    Joint pain    Family History  Problem Relation Age of Onset   Hypertension Mother    Heart disease Father    Heart attack Father    Prostate cancer Brother 74   Parkinson's disease Brother  Lung cancer Paternal Aunt        hx of smoking   Breast cancer Paternal Grandmother        dx 3s, bilateral mastectomies   Heart attack Paternal Grandfather    Blindness Son    Colon cancer Neg Hx    Pancreatic cancer Neg Hx     Prior to Admission medications   Medication Sig Start Date End Date Taking? Authorizing Provider  acetaminophen (TYLENOL) 325 MG tablet Take 2 tablets (650 mg total) by mouth every 6 (six) hours as needed for mild pain (or Fever >/= 101). Patient taking differently: Take 650 mg by mouth in the morning and at bedtime. 08/25/21   Angiulli, Lavon Paganini, PA-C  albuterol (PROVENTIL) (2.5 MG/3ML) 0.083% nebulizer solution Inhale 3 mLs into the lungs every 4 (four) hours as needed for wheezing or shortness of breath. Patient taking differently: Inhale 3 mLs into the lungs See admin instructions. Nebulize 2.5 mg & inhale into the lungs 3-4 times a day and may also use an additional 2.5 mg up to two times a day as needed for shortness of breath 08/28/21   Angiulli, Lavon Paganini, PA-C  ALPRAZolam Duanne Moron) 0.5 MG tablet Take 1 tablet (0.5 mg total) by mouth at bedtime  as needed for anxiety. 11/26/21   Aline August, MD  amiodarone (PACERONE) 200 MG tablet TAKE 1 TABLET BY MOUTH EVERY DAY Patient taking differently: Take 200 mg by mouth daily. 02/17/22   Fenton, Clint R, PA  benzonatate (TESSALON) 200 MG capsule Take 1 capsule (200 mg total) by mouth 3 (three) times daily. Patient taking differently: Take 200 mg by mouth in the morning, at noon, and at bedtime. 10/07/21   Heilingoetter, Cassandra L, PA-C  budesonide (PULMICORT) 0.5 MG/2ML nebulizer solution Take 2 mLs (0.5 mg total) by nebulization 2 (two) times daily. Patient taking differently: Take 0.5 mg by nebulization in the morning and at bedtime. 09/25/21   Icard, Octavio Graves, DO  carbidopa-levodopa (SINEMET IR) 25-100 MG tablet Take 1 tablet by mouth See admin instructions. Take 1 tablet by mouth at 8 AM, 1 PM, 7 PM, and bedtime (10 PM) Patient taking differently: Take 1 tablet by mouth 4 (four) times daily. Take 1 tablet by mouth at 8 AM, 1 PM, 7 PM, and bedtime (10 PM) 11/26/21   Aline August, MD  Dextromethorphan-Menthol (DELSYM COUGH RELIEF MT) Use as directed 10 mLs in the mouth or throat at bedtime as needed (for cough).    [provider]  diltiazem (CARDIZEM CD) 360 MG 24 hr capsule Take 1 capsule (360 mg total) by mouth daily. 11/27/21   Aline August, MD  fenofibrate 160 MG tablet TAKE 1 TABLET DAILY. PLEASE KEEP UPCOMING APPT IN MARCH WITH DR. Acie Fredrickson BEFORE ANYMORE REFILLS. Patient taking differently: Take 160 mg by mouth See admin instructions. Take 160 mg by mouth at 2 PM daily 08/25/21   Angiulli, Lavon Paganini, PA-C  fluticasone [facility]) 50 MCG/ACT nasal spray Place 2 sprays into both nostrils daily. 08/25/21   Angiulli, Lavon Paganini, PA-C  folic acid (FOLVITE) 1 MG tablet Take 1 tablet (1 mg total) by mouth daily. 08/25/21   Angiulli, Lavon Paganini, PA-C  furosemide (LASIX) 40 MG tablet Take 1 tablet (40 mg total) by mouth daily. 12/27/21   Dessa Phi, DO  guaiFENesin (MUCINEX) 600 MG 12 hr  tablet Take 2 tablets (1,200 mg total) by mouth 2 (two) times daily. 08/12/21   Kathie Dike, MD  HYDROcodone bit-homatropine (HYCODAN) 5-1.5 MG/5ML  syrup Take 5 mLs by mouth every 6 (six) hours as needed for cough. 01/27/22   Curt Bears, MD  ipratropium (ATROVENT) 0.03 % nasal spray Place 1-2 sprays into both nostrils every 12 (twelve) hours. For drainage Patient taking differently: Place 2 sprays into both nostrils in the morning. 08/28/21   Angiulli, Lavon Paganini, PA-C  levalbuterol (XOPENEX) 0.63 MG/3ML nebulizer solution Take 3 mLs (0.63 mg total) by nebulization 2 (two) times daily. 09/25/21   Icard, Octavio Graves, DO  levocetirizine (XYZAL) 5 MG tablet Take 1 tablet (5 mg total) by mouth every evening. 12/16/21   Garnet Sierras, DO  loratadine (CLARITIN) 10 MG tablet Take 1 tablet (10 mg total) by mouth daily as needed for allergies (nasal drainage). Patient taking differently: Take 10 mg by mouth daily as needed for allergies. 08/25/21   Angiulli, Lavon Paganini, PA-C  LORazepam (ATIVAN) 0.5 MG tablet Take 0.5 mg by mouth every 6 (six) hours as needed. 03/24/22   [provider]  magnesium oxide (MAG-OX) 400 (240 Mg) MG tablet Take 1 tablet (400 mg total) by mouth daily. 08/25/21   Angiulli, Lavon Paganini, PA-C  methocarbamol (ROBAXIN) 500 MG tablet Take 500 mg by mouth 2 (two) times daily as needed. 03/10/22   [provider]  metoprolol succinate (TOPROL-XL) 25 MG 24 hr tablet Take 1 tablet (25 mg total) by mouth daily. 08/25/21   Angiulli, Lavon Paganini, PA-C  Multiple Vitamins-Minerals (MULTIVITAMINS THER. W/MINERALS) TABS Take 1 tablet by mouth daily with breakfast.    [provider]  nitroGLYCERIN (NITROSTAT) 0.4 MG SL tablet Place 1 tablet (0.4 mg total) under the tongue every 5 (five) minutes as needed for chest pain. 08/25/21   Angiulli, Lavon Paganini, PA-C  omeprazole (PRILOSEC) 20 MG capsule Take 1 capsule (20 mg total) by mouth daily before breakfast. 11/26/21   Aline August, MD   ondansetron (ZOFRAN) 8 MG tablet Take 8 mg by mouth every 8 (eight) hours as needed for nausea or vomiting.    [provider]  oxyCODONE (OXY IR/ROXICODONE) 5 MG immediate release tablet Take 1 tablet (5 mg total) by mouth every 4 (four) hours as needed for moderate pain. 12/26/21   Dessa Phi, DO  polyethylene glycol (MIRALAX / GLYCOLAX) 17 g packet Take 17 g by mouth daily. 11/26/21   Aline August, MD  potassium chloride (KLOR-CON) 10 MEQ tablet TAKE 1 TABLET BY MOUTH EVERY DAY Patient taking differently: Take 10 mEq by mouth See admin instructions. Take 10 mEq by mouth at 2 PM daily 07/16/21   Nahser, Wonda Cheng, MD  prochlorperazine (COMPAZINE) 10 MG tablet Take 10 mg by mouth every 6 (six) hours as needed. 03/08/22   [provider]  rivaroxaban (XARELTO) 20 MG TABS tablet Take 1 tablet (20 mg total) by mouth daily. Patient taking differently: Take 20 mg by mouth every evening. 08/25/21   Angiulli, Lavon Paganini, PA-C  Saccharomyces boulardii (PROBIOTIC) 250 MG CAPS Take 250 mg by mouth daily.    [provider]  senna-docusate (SENOKOT-S) 8.6-50 MG tablet Take 1 tablet by mouth 2 (two) times daily. 11/20/21   Shelly Coss, MD  sodium chloride (OCEAN) 0.65 % SOLN nasal spray Place 1 spray into both nostrils as needed for congestion.    [provider]  tamsulosin (FLOMAX) 0.4 MG CAPS capsule Take 1 capsule (0.4 mg total) by mouth daily after supper. 08/25/21   Angiulli, Lavon Paganini, PA-C  Tiotropium Bromide-Olodaterol (STIOLTO RESPIMAT) 2.5-2.5 MCG/ACT AERS Inhale 2 puffs into  the lungs daily. 09/25/21   Icard, Octavio Graves, DO  topiramate (TOPAMAX) 50 MG tablet Take 1-2 tablets (50-100 mg total) by mouth See admin instructions. Take 50mg  by mouth in the morning, then 100mg  at night 11/26/21   Aline August, MD  traZODone (DESYREL) 50 MG tablet Take 50 mg by mouth at bedtime. 04/01/22   [provider]  Vitamin D, Ergocalciferol, (DRISDOL) 1.25 MG (50000  UNIT) CAPS capsule TAKE 1 CAPSULE (50,000 UNITS TOTAL) BY MOUTH EVERY 7 (SEVEN) DAYS. MONDAY 12/23/21   Brunetta Genera, MD    Physical Exam: Vitals:   04/05/22 0715 04/05/22 0730 04/05/22 0745 04/05/22 0800  BP: 116/85 122/85 100/74 104/73  Pulse: (!) 114 (!) 109 (!) 111 (!) 116  Resp: 15 17 12  (!) 24  Temp:      TempSrc:      SpO2: 95% 97% 100% 99%  Weight:      Height:        Constitutional: NAD, comfortable, quite weak but will arouse to answer simple questions, anasarca Eyes:   ENMT: DMM, throat without exudates or erythema Neck: normal, supple, no masses, no thyromegaly noted Respiratory: mildly increased work of breathing, quite weak, on oxygen  Cardiovascular: rrr w/o mrg, warm extremities Abdomen: NBS, NT,   Musculoskeletal: moving all 4 extremities, strength grossly intact 5/5 in the UE and LE's,  Skin: no rashes, lesions, ulcers. No induration Neurologic: CN 2-12 grossly intact. Sensation intact Psychiatric: AO appearing, mentation appropriate Labs on Admission: I have personally reviewed following labs and imaging studies  CBC: Recent Labs  Lab 04/05/22 0414  WBC 12.9*  NEUTROABS 10.5*  HGB 10.7*  HCT 32.2*  MCV 93.6  PLT 540   Basic Metabolic Panel: Recent Labs  Lab 04/05/22 0414  NA 121*  K 4.5  CL 87*  CO2 24  GLUCOSE 90  BUN 13  CREATININE 0.47*  CALCIUM 8.7*   GFR: Estimated Creatinine Clearance: 94.2 mL/min (A) (by C-G formula based on SCr of 0.47 mg/dL (L)). Liver Function Tests: Recent Labs  Lab 04/05/22 0414  AST 38  ALT 18  ALKPHOS 70  BILITOT 0.9  PROT 5.7*  ALBUMIN 2.4*   No results for input(s): "LIPASE", "AMYLASE" in the last 168 hours. No results for input(s): "AMMONIA" in the last 168 hours. Coagulation Profile: No results for input(s): "INR", "PROTIME" in the last 168 hours. Cardiac Enzymes: No results for input(s): "CKTOTAL", "CKMB", "CKMBINDEX", "TROPONINI" in the last 168 hours. BNP (last 3 results) No  results for input(s): "PROBNP" in the last 8760 hours. HbA1C: No results for input(s): "HGBA1C" in the last 72 hours. CBG: No results for input(s): "GLUCAP" in the last 168 hours. Lipid Profile: No results for input(s): "CHOL", "HDL", "LDLCALC", "TRIG", "CHOLHDL", "LDLDIRECT" in the last 72 hours. Thyroid Function Tests: No results for input(s): "TSH", "T4TOTAL", "FREET4", "T3FREE", "THYROIDAB" in the last 72 hours. Anemia Panel: No results for input(s): "VITAMINB12", "FOLATE", "FERRITIN", "TIBC", "IRON", "RETICCTPCT" in the last 72 hours. Urine analysis:    Component Value Date/Time   COLORURINE YELLOW 12/20/2021 [place] 12/20/2021 0835   LABSPEC 1.020 12/20/2021 0835   PHURINE 6.0 12/20/2021 [place] 12/20/2021 0835   HGBUR NEGATIVE 12/20/2021 0835   [place] 12/20/2021 0835   KETONESUR NEGATIVE 12/20/2021 0835   PROTEINUR NEGATIVE 12/20/2021 0835   NITRITE NEGATIVE 12/20/2021 0835   LEUKOCYTESUR NEGATIVE 12/20/2021 0835    Radiological Exams on Admission:  EKG: Independently reviewed. As above  Assessment/Plan Principal Problem:   Acute hypoxemic respiratory failure ([place])  ########After talking w next of kin Corey [name], family and her want to keep him comfortable probably. So will admit through observation while Fabio Pierce has family meeting about where to send Corey [name] either in hospital or [place] place.   --considering palliative referral to help coordinate but seems calm for now.  Acute on chronic resp failure, okay compensation on vbg --SOB - suspect most in part from RVR, but mucous plugging noted on CT scan, progressed right pleural effusion and ED placed ultrasound thoracentesis, doubt related to moderate volume pericardial effusion weaning oxygen some  --Low albumin to 2.4 with progressed liver metastasis and swollen diffusely suspect anasarca, BNP elevation to 259.4, consider heart failure, wonder component of ascites and  hesitate to diurese since patient also looks a little dry and bp's soft but will consider --Afib/flutter with RVR- rates doing okay on amiodarone, will continue -------needs med rec, dilt and metop at home --------NPO for now given mental status  #hospice Anxiety, pain medications held til med rec  Hyponatremia 121 from 133 on 12/26/2021-we will get labs in case affecting mental status Anemia- Suspect demand ischemia from RVR, peak of 61, repeating ekg  Prolonged Qtc - repeating ekg  Needs med rec  Pleural effusion - thora [place] ordered, but d/c'ed til talks with family. hold xarelto probably after talks with family ascites --GERD- ?sinemet, topamax --bph--flomax?  Patient and/or Family completely agreed with the plan, expressed understanding and I answered all questions.  DVT prophylaxis: None:xarelto at home  consider restarting or ppx despite liver failure Code Status: DNR Family Communication: Corey [name] [phone] Disposition Plan: suspect comfort care or hospice at home Consults called: na Admission status: observation for now based on patient and family's goals of care  A total of 80 minutes utilized during this admission.  [facility]   If 7PM-7AM, please contact night-coverage [email or web address] Password [facility]  04/05/2022, 8:28 AM

## 2022-04-05 NOTE — ED Notes (Signed)
Pt to CT at this time.

## 2022-04-05 NOTE — ED Triage Notes (Signed)
Pt from home via [facility] c/o SOB yesterday, hx of lung cancer, pt is a HOSPICE pt and DNR. Hx of Afib. Home health nurse visited pt to day, gave enema. Pt came in soiled, wife unable to clean pt. EMS reports pt at baseline. Wears 4L [place] at home. GCS 14.

## 2022-04-05 NOTE — Discharge Summary (Signed)
Physician Discharge Summary  Corey [name] TTS:[id number] DOB: Feb 22, 1946 DOA: 04/05/2022  PCP: [name], MD  Admit date: 04/05/2022 Discharge date: 04/05/2022  Admitted From: home Disposition: [facility]  Recommendations for Outpatient Follow-up:  Follow up with PCP in 1-2 weeks if matches with goals of care Titrate medicines for comfort at facility, consider stopping medications for comfort  Home Health:no  Equipment/Devices:oxygen  Discharge Condition:hospice  CODE STATUS:comfort care Diet recommendation: at his discretion  Brief/Interim Summary: Corey [name] is a 76 y.o. male with a pertinent history of  paroxysmal A-fib, stage IV non-small cell lung cancer, chronic respiratory failure on home oxygen, COPD, HTN, HLD, CAD who presents emergency department for evaluation of shortness of breath.  Under hospice care.  Acute on chronic resp failure, okay compensation on vbg.  Shortness of breath suspect most in part from RVR, but mucous plugging noted on CT scan, progressed right pleural effusion in setting of low albumin and anasaraca. We decided against thoracentesis and chose to keep patient comfortable.  He was making remarks about being uncomfortable and wife echoes it has been a tough road with this cancer and other things.  They met with [facility] who admitted pt to [facility] to be watched more closely and kept comfortable.  I was requested to place pain and anxiety medicines prior to transfer to keep pt comofrtable and I obliged.  I was told to continue medicaitons and would be stopped/filtered out at [facility].  Discharge Diagnoses:  Principal Problem:   Acute on chronic respiratory failure with hypoxia (HCC) Active Problems:   Bilateral pleural effusion   Non-small cell carcinoma of left lung, stage 4 (HCC)   COPD (chronic obstructive pulmonary disease) (HCC)   Paroxysmal atrial fibrillation with RVR (HCC)   Acute hypoxemic respiratory failure  (HCC)    Discharge Instructions   Allergies as of 04/05/2022       Reactions   Amoxicillin Anaphylaxis, Swelling   Tolerates Rocephin   Fosinopril Other (See Comments), Cough   Muscle aches (Monopril)   Hydromorphone Nausea And Vomiting   Ampicillin Rash   Codeine Nausea Only, Other (See Comments)   Heavy amounts cause nausea and stomach upset   Rosuvastatin Other (See Comments)   Joint pain        Medication List     TAKE these medications    albuterol (2.5 MG/3ML) 0.083% nebulizer solution Commonly known as: PROVENTIL Inhale 3 mLs into the lungs every 4 (four) hours as needed for wheezing or shortness of breath. What changed:  when to take this additional instructions   amiodarone 200 MG tablet Commonly known as: PACERONE TAKE 1 TABLET BY MOUTH EVERY DAY What changed: when to take this   benzonatate 200 MG capsule Commonly known as: TESSALON Take 1 capsule (200 mg total) by mouth 3 (three) times daily. What changed: when to take this   budesonide 0.5 MG/2ML nebulizer solution Commonly known as: PULMICORT Take 2 mLs (0.5 mg total) by nebulization 2 (two) times daily. What changed: when to take this   diltiazem 360 MG 24 hr capsule Commonly known as: CARDIZEM CD Take 1 capsule (360 mg total) by mouth daily. What changed: when to take this   guaiFENesin 600 MG 12 hr tablet Commonly known as: MUCINEX Take 2 tablets (1,200 mg total) by mouth 2 (two) times daily. What changed: how much to take   HYDROcodone bit-homatropine 5-1.5 MG/5ML syrup Commonly known as: Hycodan Take 5 mLs by mouth every 6 (  six) hours as needed for cough. What changed: when to take this   loratadine 10 MG tablet Commonly known as: CLARITIN Take 1 tablet (10 mg total) by mouth daily as needed for allergies (nasal drainage). What changed: when to take this   LORazepam 0.5 MG tablet Commonly known as: ATIVAN Take 0.5 mg by mouth See admin instructions. Take one tablet (0.5 mg) by  mouth every afternoon, then take one tablet (0.5 mg) 6 hours later as needed for anxiety/agitation   methocarbamol 500 MG tablet Commonly known as: ROBAXIN Take 500 mg by mouth daily as needed for muscle spasms.   metoprolol succinate 25 MG 24 hr tablet Commonly known as: TOPROL-XL Take 1 tablet (25 mg total) by mouth daily. What changed: when to take this   morphine 15 MG tablet Commonly known as: MSIR Take 15 mg by mouth at bedtime.   nitroGLYCERIN 0.4 MG SL tablet Commonly known as: NITROSTAT Place 1 tablet (0.4 mg total) under the tongue every 5 (five) minutes as needed for chest pain.   oxyCODONE 5 MG immediate release tablet Commonly known as: Oxy IR/ROXICODONE Take 1 tablet (5 mg total) by mouth every 4 (four) hours as needed for moderate pain. What changed:  when to take this reasons to take this   polyethylene glycol 17 g packet Commonly known as: MIRALAX / GLYCOLAX Take 17 g by mouth daily. What changed:  when to take this reasons to take this additional instructions   prochlorperazine 10 MG tablet Commonly known as: COMPAZINE Take 10 mg by mouth every 6 (six) hours as needed for nausea or vomiting.   rivaroxaban 20 MG Tabs tablet Commonly known as: Xarelto Take 1 tablet (20 mg total) by mouth daily. What changed: when to take this   senna-docusate 8.6-50 MG tablet Commonly known as: Senokot-S Take 1 tablet by mouth 2 (two) times daily. What changed: when to take this   Stiolto Respimat 2.5-2.5 MCG/ACT Aers Generic drug: Tiotropium Bromide-Olodaterol Inhale 2 puffs into the lungs daily.   traZODone 50 MG tablet Commonly known as: DESYREL Take 50 mg by mouth at bedtime as needed for sleep.        Allergies  Allergen Reactions   Amoxicillin Anaphylaxis and Swelling    Tolerates Rocephin   Fosinopril Other (See Comments) and Cough    Muscle aches (Monopril)   Hydromorphone Nausea And Vomiting   Ampicillin Rash   Codeine Nausea Only and Other  (See Comments)    Heavy amounts cause nausea and stomach upset   Rosuvastatin Other (See Comments)    Joint pain    Consultations:    Procedures/Studies: CT Angio Chest PE W and/or Wo Contrast  Result Date: 04/05/2022 CLINICAL DATA:  Evaluate for pulmonary embolism. History of lung cancer. EXAM: CT ANGIOGRAPHY CHEST WITH CONTRAST TECHNIQUE: Multidetector CT imaging of the chest was performed using the standard protocol during bolus administration of intravenous contrast. Multiplanar CT image reconstructions and MIPs were obtained to evaluate the vascular anatomy. RADIATION DOSE REDUCTION: This exam was performed according to the departmental dose-optimization program which includes automated exposure control, adjustment of the mA and/or kV according to patient size and/or use of iterative reconstruction technique.  *body onc* CONTRAST:  55m OMNIPAQUE IOHEXOL 350 MG/ML SOLN COMPARISON:  CT chest 12/20/2021 and CT AP 10/15/2021. FINDINGS: Cardiovascular: Preferential opacification of the pulmonary arteries to the segmental level. No signs of acute pulmonary embolus. Normal heart size. Moderate pericardial effusion has increased in volume from previous exam with a  thickness measuring up to 3.3 cm over the left ventricle, image 74/5. Aortic atherosclerosis and coronary artery calcifications. Mediastinum/Nodes: No enlarged supraclavicular or axillary lymph nodes. No signs mediastinal adenopathy. Thyroid gland, trachea and esophagus are unremarkable. Distal to the carina there is complete opacification of the left mainstem bronchus which is a new finding compared with the previous exam and may reflect underlying mucous plugging versus progression of disease. Lungs/Pleura: There is a moderate right pleural effusion with overlying compressive type atelectasis. This appears increased in volume compared with the previous exam. Complete opacification of the left hemithorax is identified, which is progressed from  the previous exam. Findings may reflect progression of known malignancy and/or sequelae of progressive mucous plugging of the left mainstem bronchus. Upper Abdomen: Multiple liver metastases are identified which have progressed when compared with 10/15/2021. Lesions are noted throughout both lobes of the liver. Index lesion within segment 8/4 measures 4.9 x 5.6 cm, image 88/5. On 10/15/2021 this measured 2.0 x 1.8 cm. Lesion within segment 7/5 measures 4.7 x 3.8 cm, image 113/5. Previously 2.6 x 1.5 cm. New lesion within segment 3 measures 2.0 x 1.7 cm, image 103/5. Interval development of upper abdominal ascites. There is abnormal enhancing soft tissue along the left hemidiaphragm, image 42/a. Trans diaphragmatic spread of disease cannot be excluded. Musculoskeletal: No chest wall abnormality. No acute or significant osseous findings. Review of the MIP images confirms the above findings. IMPRESSION: 1. No evidence for acute pulmonary embolus. 2. Complete opacification of the left hemithorax is progressed from the previous exam worrisome for progression of known malignancy and/or sequelae of progressive mucous plugging within the left mainstem bronchus. 3. Increase in volume of right pleural effusion with overlying compressive type atelectasis. 4. Increase in volume of pericardial effusion. Now moderate in volume 5. New upper abdominal ascites with soft tissue thickening along the lateral aspect of the left hemidiaphragm. Trans diaphragmatic spread of disease with peritoneal carcinomatosis cannot be excluded. 6. Significant interval progression of liver metastases. 7. Aortic Atherosclerosis (ICD10-I70.0). Coronary artery calcifications. Electronically Signed   By: Kerby Moors M.D.   On: 04/05/2022 06:39   DG Chest Portable 1 View  Result Date: 04/05/2022 CLINICAL DATA:  Dyspnea.  History of lung cancer. EXAM: PORTABLE CHEST 1 VIEW COMPARISON:  Portable chest 12/23/2021 chest CT 12/20/2021. FINDINGS: Right  chest IJ approach port catheter terminates at the superior cavoatrial junction as before with again noted left chest loop recorder device. The heart is again noted obscured within the opacified left hemithorax. Similar to the 2 prior studies there is associated left-sided mediastinal shift, on the chest CT appeared to be due to either obstruction or mucous plugging of the distal left main bronchus. There is probably at least a small left pleural effusion and a small right pleural effusion is noted. Interstitial and patchy hazy opacities of the right lower lung field could be atelectasis overlying the effusion or a new area of pneumonia. The right mid and upper lung fields clear. There is aortic atherosclerosis. Mild thoracic dextroscoliosis and spondylosis. IMPRESSION: 1. Since 12/21/2021, there is either continued ongoing or recurrent opacification of the left hemithorax with volume loss. 2. Likely at least small left pleural effusion with small right pleural effusion with overlying atelectasis or infiltrates. Electronically Signed   By: Telford Nab M.D.   On: 04/05/2022 05:37   CUP PACEART REMOTE DEVICE CHECK  Result Date: 03/14/2022 ILR summary report received. Battery status OK. Normal device function. No new symptom, tachy, brady, or pause episodes.  No new AF episodes. AF burden is 5% of the time, on [place].  Monthly summary reports and ROV/PRN Kathy Breach, RN, CCDS, [employer]   Subjective:   Discharge Exam: Vitals:   04/05/22 1345 04/05/22 1525  BP: 111/60 100/69  Pulse: (!) 101 (!) 119  Resp: 20 15  Temp:    SpO2: 99% 100%   Vitals:   04/05/22 1315 04/05/22 1330 04/05/22 1345 04/05/22 1525  BP: 100/66 123/77 111/60 100/69  Pulse: (!) 44 (!) 102 (!) 101 (!) 119  Resp: (!) _0 Temp:      TempSrc:      SpO2: 99% 99% 99% 100%  Weight:      Height:        General: pt appears weak with anasarca    The results of significant diagnostics from this hospitalization  (including imaging, microbiology, ancillary and laboratory) are listed below for reference.     Microbiology: No results found for this or any previous visit (from the past 240 hour(s)).   Labs: BNP (last 3 results) Recent Labs    11/03/21 1208 11/21/21 0949 04/05/22 0414  BNP 187.8* 161.0* 902.4*   Basic Metabolic Panel: Recent Labs  Lab 04/05/22 0414 04/05/22 0831  NA 121*  --   K 4.5  --   CL 87*  --   CO2 24  --   GLUCOSE 90  --   BUN 13  --   CREATININE 0.47*  --   CALCIUM 8.7*  --   MG  --  1.6*  PHOS  --  4.4   Liver Function Tests: Recent Labs  Lab 04/05/22 0414  AST 38  ALT 18  ALKPHOS 70  BILITOT 0.9  PROT 5.7*  ALBUMIN 2.4*   No results for input(s): "LIPASE", "AMYLASE" in the last 168 hours. Recent Labs  Lab 04/05/22 0859  AMMONIA 31   CBC: Recent Labs  Lab 04/05/22 0414  WBC 12.9*  NEUTROABS 10.5*  HGB 10.7*  HCT 32.2*  MCV 93.6  PLT 229   Cardiac Enzymes: No results for input(s): "CKTOTAL", "CKMB", "CKMBINDEX", "TROPONINI" in the last 168 hours. BNP: Invalid input(s): "POCBNP" CBG: No results for input(s): "GLUCAP" in the last 168 hours. D-Dimer No results for input(s): "DDIMER" in the last 72 hours. Hgb A1c No results for input(s): "HGBA1C" in the last 72 hours. Lipid Profile No results for input(s): "CHOL", "HDL", "LDLCALC", "TRIG", "CHOLHDL", "LDLDIRECT" in the last 72 hours. Thyroid function studies No results for input(s): "TSH", "T4TOTAL", "T3FREE", "THYROIDAB" in the last 72 hours.  Invalid input(s): "FREET3" Anemia work up No results for input(s): "VITAMINB12", "FOLATE", "FERRITIN", "TIBC", "IRON", "RETICCTPCT" in the last 72 hours. Urinalysis    Component Value Date/Time   COLORURINE YELLOW 12/20/2021 0835   APPEARANCEUR CLEAR 12/20/2021 0835   LABSPEC 1.020 12/20/2021 0835   PHURINE 6.0 12/20/2021 0835   GLUCOSEU NEGATIVE 12/20/2021 0835   HGBUR NEGATIVE 12/20/2021 0835   [place] 12/20/2021 0835    KETONESUR NEGATIVE 12/20/2021 0835   PROTEINUR NEGATIVE 12/20/2021 0835   NITRITE NEGATIVE 12/20/2021 0835   LEUKOCYTESUR NEGATIVE 12/20/2021 0835   Sepsis Labs Recent Labs  Lab 04/05/22 0414  WBC 12.9*   Microbiology No results found for this or any previous visit (from the past 240 hour(s)).  Nutrition Status:           Time coordinating discharge: Over 50 minutes  SIGNED: Claudia Desanctis DO [facility] 04/05/2022, 8:58  PM Pager [phone]  If 7PM-7AM, please contact night-coverage [email or web address] Password [email or web address]

## 2022-04-05 NOTE — ED Notes (Signed)
Message sent to Dr. Matilde Sprang, pt requesting additional pain medication

## 2022-04-05 NOTE — Progress Notes (Addendum)
[employer]uring [employer] [facility]) Hospital Liaison Note  This is a current [facility] hospice patient with a dx of lung cancer. We will follow while inpatient.   Bed offered and accepted for transfer to [facility] today. Unit RN please call report to [phone] prior to patient leaving the unit. Please send signed DNR and paperwork with patient.   Please call with any questions or concerns. Thank you  Roselee Nova, [facility] Liaison [phone]

## 2022-04-08 ENCOUNTER — Encounter: Payer: Self-pay | Admitting: Internal Medicine

## 2022-05-04 DEATH — deceased

## 2022-08-09 IMAGING — CT CT ANGIO HEAD-NECK (W OR W/O PERF)
2 of 7 series · 8 of 33 positions shown · non-contrast
Comparison: Prior head CT from earlier the same day.

CLINICAL DATA: Initial evaluation for neuro deficit, stroke.

EXAM:
CT ANGIOGRAPHY HEAD AND NECK
TECHNIQUE: Multidetector CT imaging of the head and neck was performed using
the standard protocol during bolus administration of intravenous
contrast. Multiplanar CT image reconstructions and MIPs were
obtained to evaluate the vascular anatomy. Carotid stenosis
measurements (when applicable) are obtained utilizing NASCET
criteria, using the distal internal carotid diameter as the
denominator.

[Series 6: cta head neck · axial · 0.50mm/px · z∈[-226,-92]mm · 2 of 203 slices shown]
[im 68/203  soft-tissue]
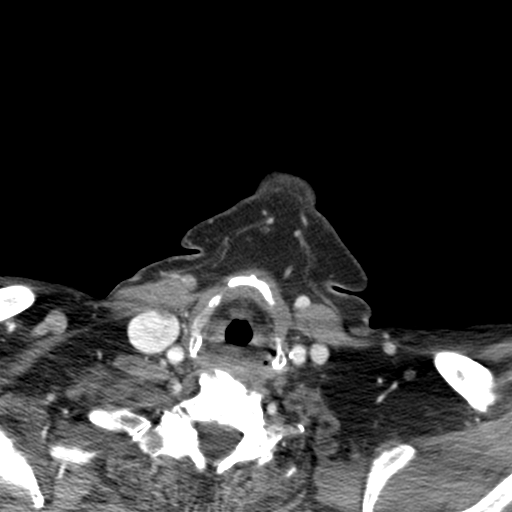
[im 135/203  soft-tissue]
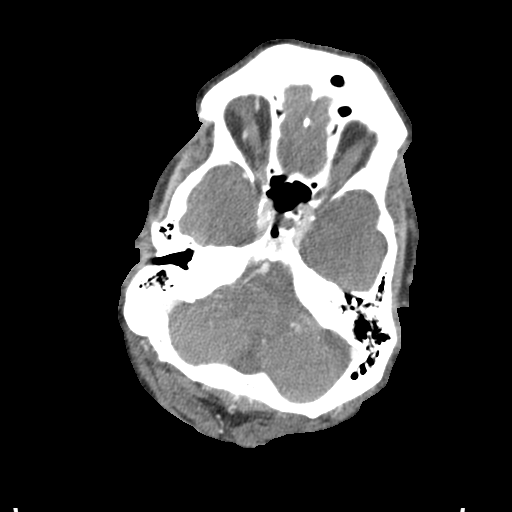

[Series 9: ax thin · axial · 0.42mm/px · z∈[-302,-15]mm · 6 of 403 slices shown]
[im 58/403  soft-tissue]
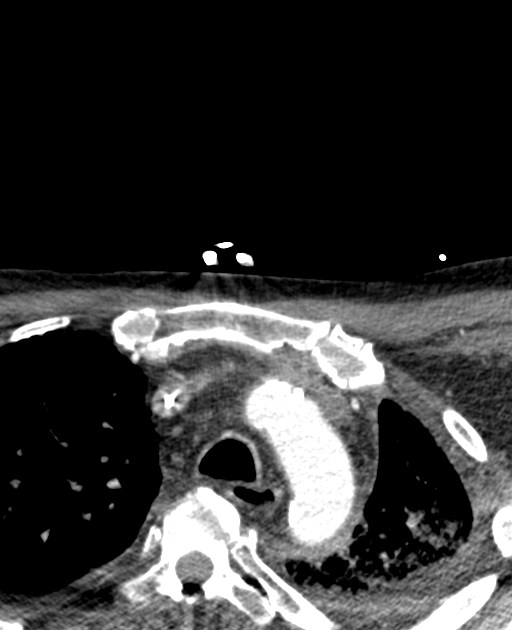
[im 115/403  bone]
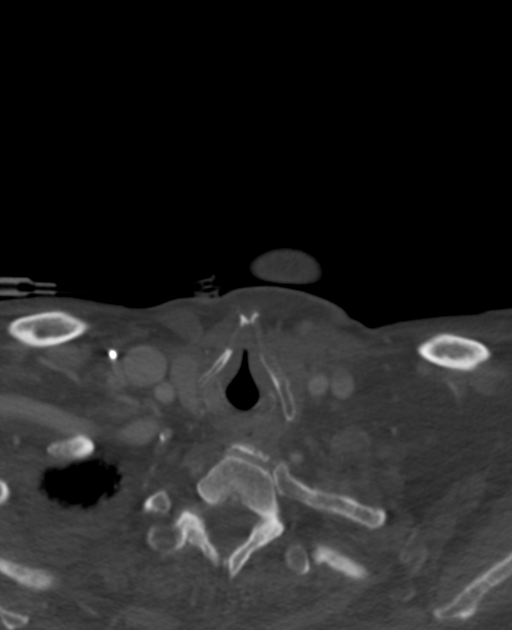
[im 173/403  soft-tissue]
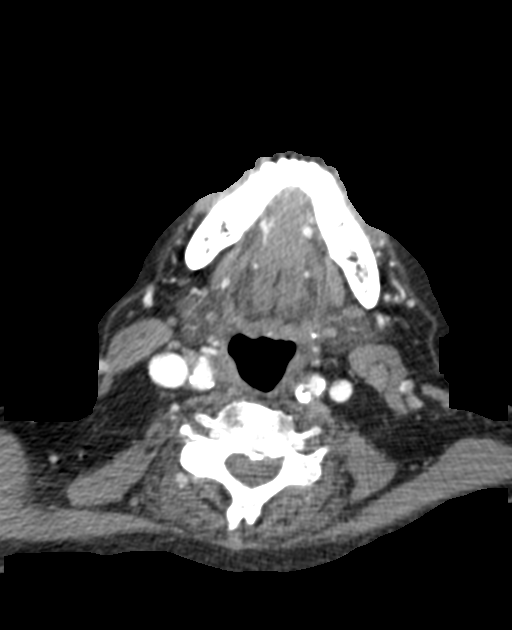
[im 230/403  bone]
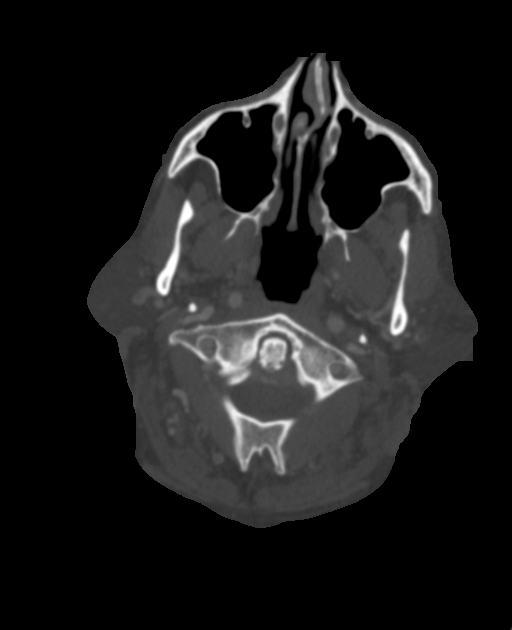
[im 288/403  soft-tissue]
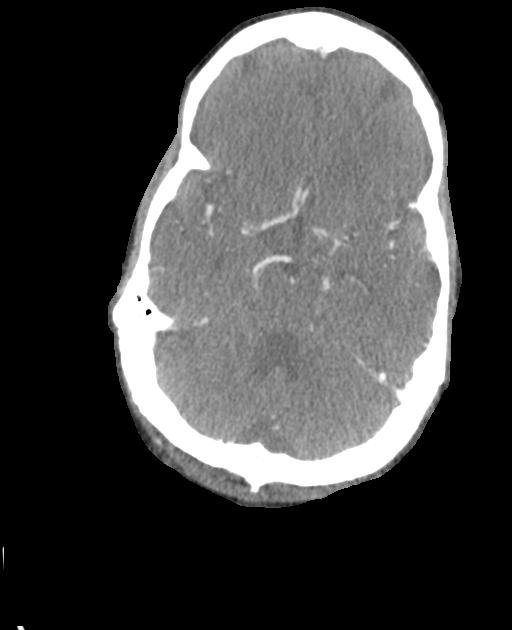
[im 345/403  bone]
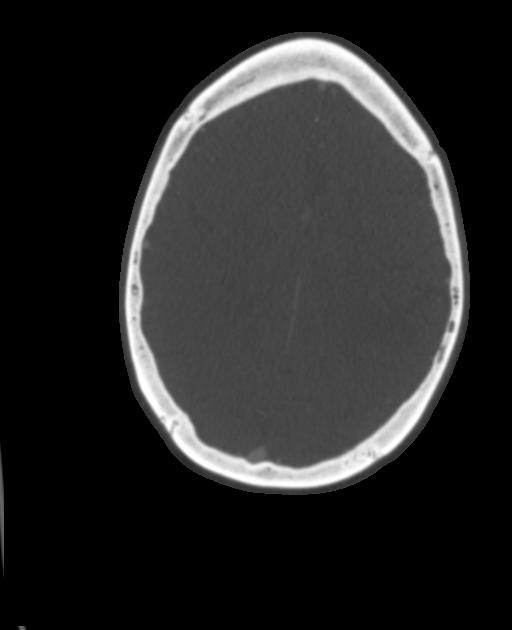

[8 of 33 positions shown; findings below may reference images not displayed]

RADIATION DOSE REDUCTION: This exam was performed according to the
departmental dose-optimization program which includes automated
exposure control, adjustment of the mA and/or kV according to
patient size and/or use of iterative reconstruction technique.

CONTRAST:  100mL OMNIPAQUE IOHEXOL 350 MG/ML SOLN
FINDINGS: CTA NECK FINDINGS

Aortic arch: Visualized aortic arch normal in caliber with normal
branch pattern. Mild-to-moderate atheromatous change about the arch
and origin of the great vessels without hemodynamically significant
stenosis.

Right carotid system: Right CCA patent from its origin to the
bifurcation without stenosis. Moderate atheromatous plaque about the
right carotid bulb/proximal right ICA without significant stenosis.
Right ICA patent distally without stenosis or dissection.

Left carotid system: Left CCA patent from its origin to the
bifurcation without stenosis. Prominent atheromatous plaque about
the left carotid bulb/proximal left ICA with associated stenosis of
up to approximately 70-80% by NASCET criteria (series 9, image 210).
Exact measurements difficult given motion and streak artifact
through this region. Left ICA tortuous but otherwise patent distally
to the skull base.

Vertebral arteries: Both vertebral arteries arise from the
subclavian arteries. No significant proximal subclavian artery
stenosis. Atheromatous change about the origins and proximal V1
segments bilaterally with associated moderate up to approximate 50%
stenoses. Vertebral arteries otherwise patent distally without
stenosis or dissection.

Skeleton: No discrete or worrisome osseous lesions. Prior fusion at
C5-C6.

Other neck: No other acute soft tissue abnormality within the neck.

Upper chest: Question possible filling defects involving the left
main pulmonary artery, which could reflect acute pulmonary embolus
(series 9, image 396). Finding not entirely certain given timing of
the contrast bolus on this exam. Consolidative opacity noted within
the visualized left lung similar to previous. Bilateral pleural
effusions partially visualized. Degree of underlying pulmonary
interstitial edema noted.

Review of the MIP images confirms the above findings

CTA HEAD FINDINGS

Anterior circulation: Examination somewhat limited by timing the
contrast bolus.

Petrous segments patent bilaterally. Atheromatous change within the
carotid siphons with associated mild to moderate multifocal
narrowing. A1 segments patent bilaterally. Normal anterior
communicating artery complex. Anterior cerebral arteries perfused to
their distal aspects without visible stenosis. No M1 stenosis or
occlusion. Normal MCA bifurcations. Distal MCA branches perfused and
symmetric.

Posterior circulation: Both V4 segments patent without stenosis.
Left PICA patent. Right PICA not definitely seen. Basilar patent to
its distal aspect without stenosis. Superior cerebellar arteries
patent bilaterally. Both PCAs primarily supplied via the basilar
well perfused or distal aspects.

Venous sinuses: Patent allowing for timing the contrast bolus.

Anatomic variants: None significant.  No visible aneurysm.

Review of the MIP images confirms the above findings
IMPRESSION: 1. Negative CTA for large vessel occlusion.
2. Atheromatous plaque about the left carotid bulb/proximal left ICA
with associated stenosis of up to approximately 70-80% by NASCET
criteria.
3. Atheromatous change about the origins of both vertebral arteries
with associated moderate multifocal stenoses.
4. Question filling defects involving the left main pulmonary
artery, suspicious for acute pulmonary emboli. Further evaluation
with dedicated cross-sectional imaging of the chest recommended.
5. Consolidative opacity within the visualized left lung, similar to
prior CT from 10/15/2021.
[DATE]. Layering bilateral pleural effusions with underlying pulmonary
interstitial edema, partially visualized.

Critical Value/emergent results were called by telephone at the time
of interpretation on 11/10/2021 at [DATE] to the Nurse practitioner
taking care of the patient, Ensuela Sanfilippo , who verbally acknowledged
these results.

## 2022-08-09 IMAGING — MR MR HEAD WO/W CM
11 of 13 series · 34 of 48 positions shown · IV contrast (gadavist)
Comparison: Prior CT from earlier the same day.

CLINICAL DATA: Follow-up examination for stroke.

EXAM:
MRI HEAD WITHOUT AND WITH CONTRAST
TECHNIQUE: Multiplanar, multiecho pulse sequences of the brain and surrounding
structures were obtained without and with intravenous contrast.
CONTRAST:  10mL GADAVIST GADOBUTROL 1 MMOL/ML IV SOLN

[Series 7: T2 · sagittal · 5.0mm · 0.47mm/px · 2 of 24 slices shown (1 of 2)]
[im 1/24]
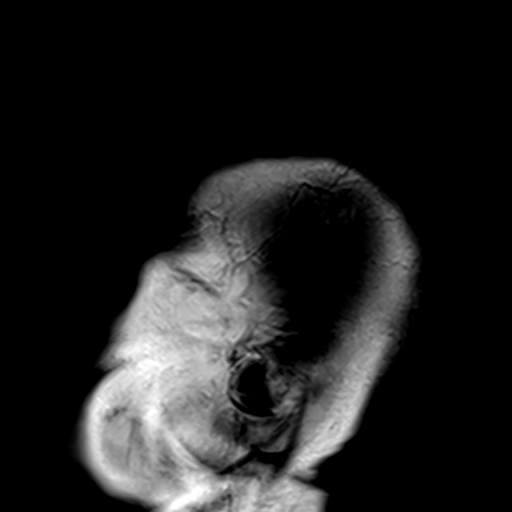
[im 24/24]
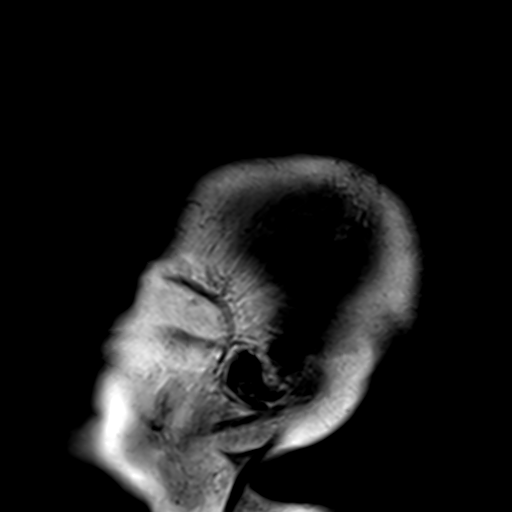

[Series 8: T2 · axial · 5.0mm · 0.47mm/px · z∈[-3,+158]mm · 2 of 26 slices shown (2 of 2)]
[im 1/26]
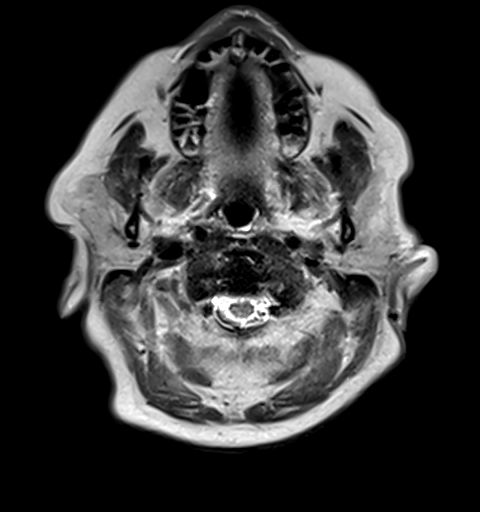
[im 26/26]
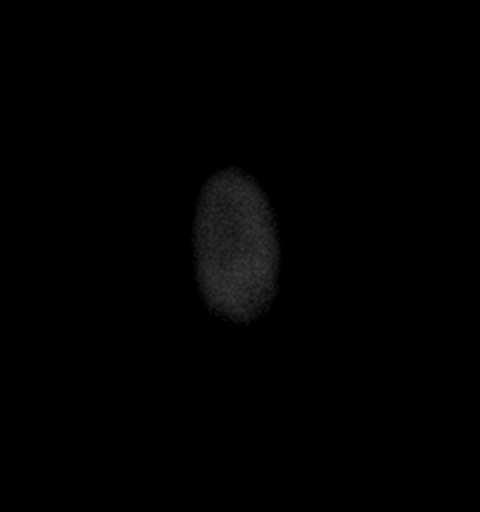

[Series 9: GRE · axial · 3.0mm · 0.47mm/px · z∈[+6,+155]mm · 4 of 51 slices shown]
[im 1/51]
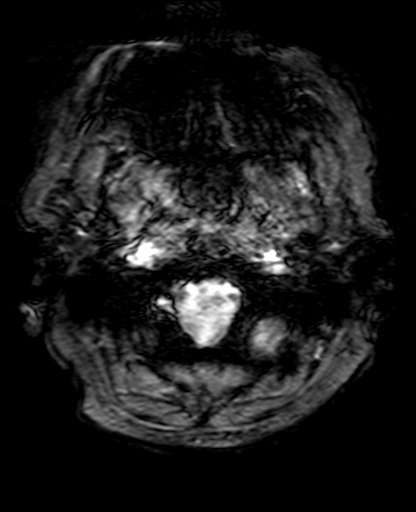
[im 17/51]
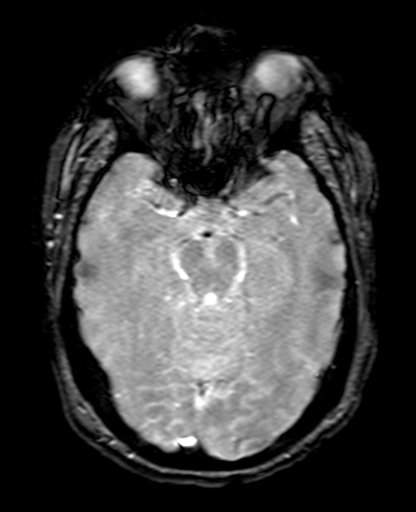
[im 34/51]
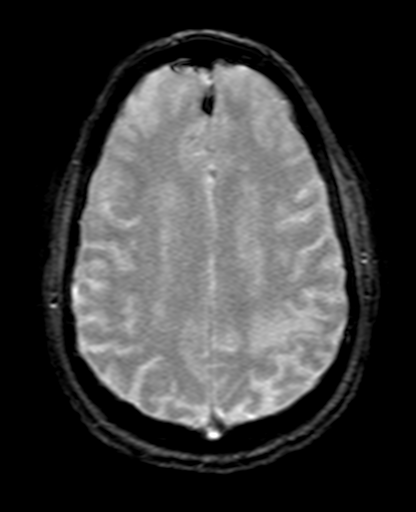
[im 51/51]
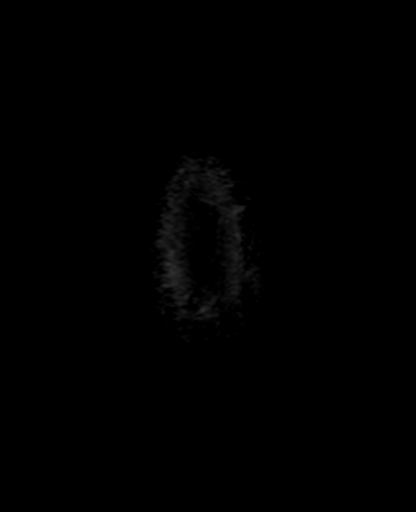

[Series 10: FLAIR · axial · 3.0mm · 0.94mm/px · z∈[+1,+150]mm · 4 of 51 slices shown]
[im 1/51]
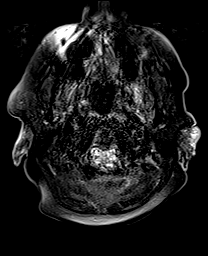
[im 17/51]
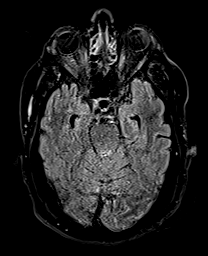
[im 34/51]
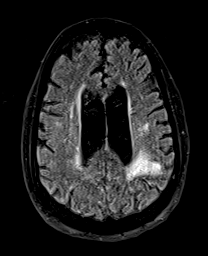
[im 51/51]
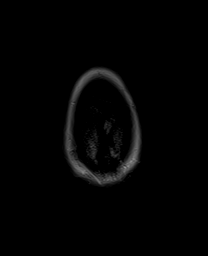

[Series 11: T1 · axial · 3.0mm · 0.47mm/px · z∈[+6,+54]mm · 2 of 51 slices shown]
[im 1/51]
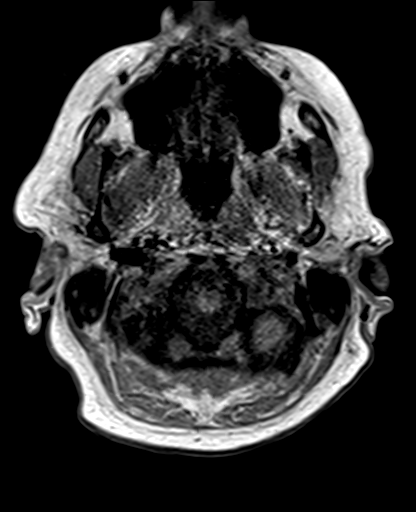
[im 17/51]
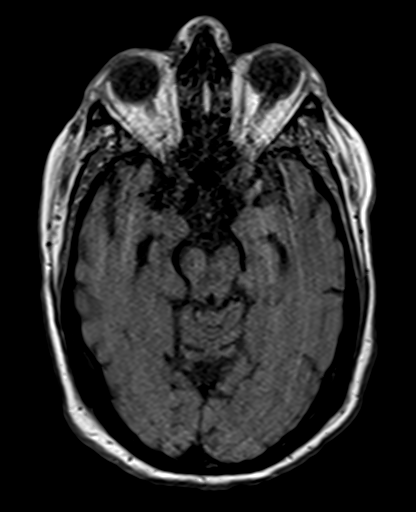

[Series 12: DWI · coronal · 5.0mm · 1.31mm/px · 5 of 64 slices shown (1 of 2)]
[im 1/64]
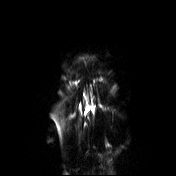
[im 16/64]
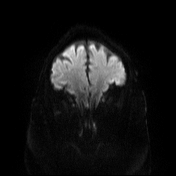
[im 32/64]
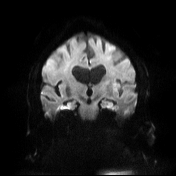
[im 48/64]
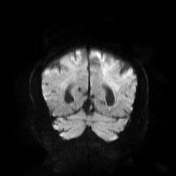
[im 64/64]
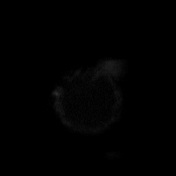

[Series 13: DWI · coronal · 5.0mm · 1.31mm/px · 3 of 32 slices shown (2 of 2)]
[im 1/32]
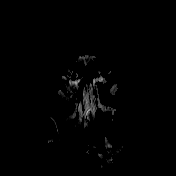
[im 16/32]
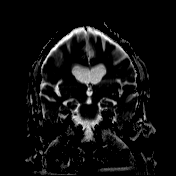
[im 32/32]
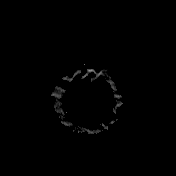

[Series 14: T2 post-contrast · coronal · 5.0mm · 0.86mm/px · 3 of 32 slices shown]
[im 1/32]
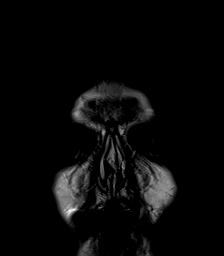
[im 16/32]
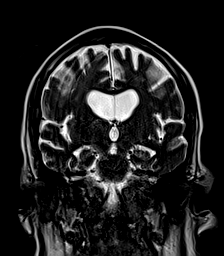
[im 32/32]
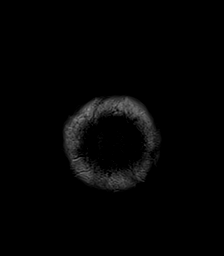

[Series 17: T1 post-contrast · coronal · 5.0mm · 0.47mm/px · 3 of 32 slices shown (1 of 3)]
[im 1/32]
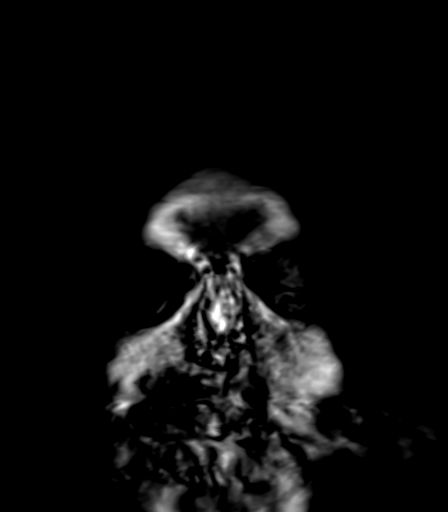
[im 16/32]
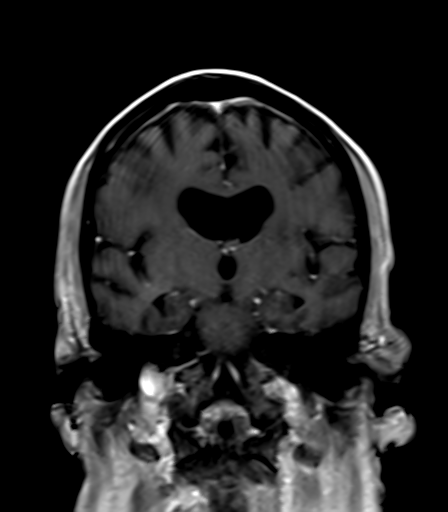
[im 32/32]
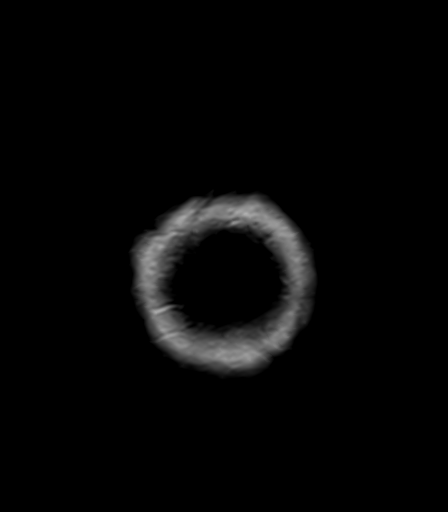

[Series 18: T1 post-contrast · sagittal · 5.0mm · 0.94mm/px · 2 of 24 slices shown (2 of 3)]
[im 1/24]
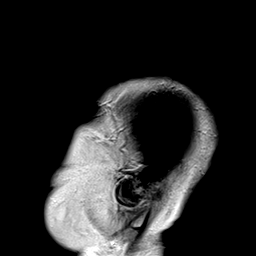
[im 24/24]
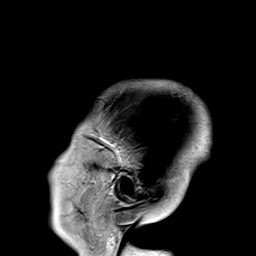

[Series 19: T1 post-contrast · axial · 3.0mm · 0.47mm/px · z∈[+6,+155]mm · 4 of 51 slices shown (3 of 3)]
[im 1/51]
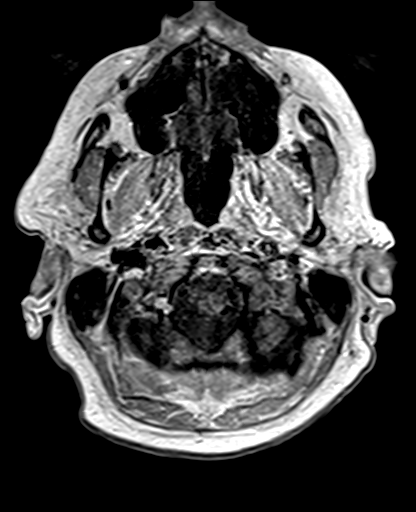
[im 17/51]
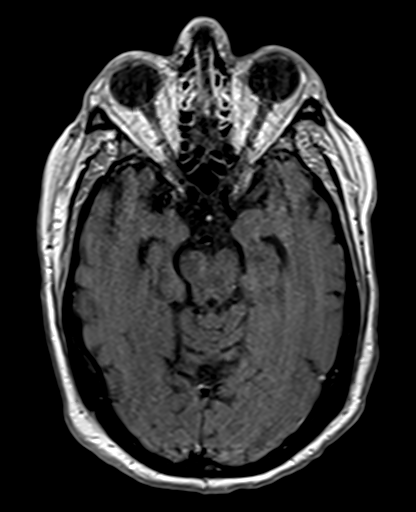
[im 34/51]
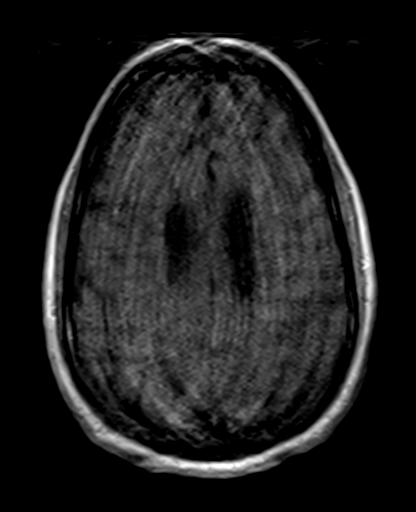
[im 51/51]
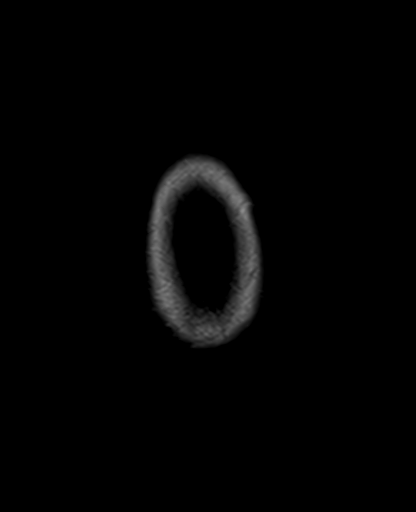

[34 of 48 positions shown; findings below may reference images not displayed]

FINDINGS: Brain: Examination moderately degraded by motion artifact.

Generalized age-related cerebral atrophy. Patchy and confluent
T2/FLAIR hyperintensity involving the periventricular deep white
matter both cerebral hemispheres most consistent with chronic small
vessel ischemic disease, mild to moderate in nature. Area of
encephalomalacia and gliosis involving the left parietal lobe,
consistent with a chronic ischemic infarct, posterior left MCA
distribution.

No abnormal foci of restricted diffusion to suggest acute or
subacute ischemia. Gray-white matter differentiation otherwise
maintained. No other areas of chronic cortical infarction. No
visible acute or chronic intracranial blood products.

No mass lesion, midline shift or mass effect. Mild ventricular
prominence related to global parenchymal volume loss without
hydrocephalus. No extra-axial fluid collection. Partially empty
sella noted. Midline structures intact. No visible abnormal
enhancement.

Vascular: Major intracranial vascular flow voids are maintained.

Skull and upper cervical spine: Craniocervical junction within
normal limits. Bone marrow signal intensity grossly normal. No scalp
soft tissue abnormality.

Sinuses/Orbits: Globes and orbital soft tissues demonstrate no acute
finding. Mild mucosal thickening noted within the ethmoidal air
cells. Paranasal sinuses are otherwise clear. Trace right mastoid
effusion noted, of doubtful significance.

Other: None.
IMPRESSION: 1. No acute intracranial infarct or other abnormality.
2. Chronic left parietal infarct.
3. Underlying mild to moderate chronic microvascular ischemic
disease.

## 2022-09-13 ENCOUNTER — Telehealth: Payer: Self-pay | Admitting: *Deleted

## 2023-09-15 ENCOUNTER — Encounter: Payer: Self-pay | Admitting: Genetic Counselor

## 2023-09-15 DIAGNOSIS — Z1509 Genetic susceptibility to other malignant neoplasm: Secondary | ICD-10-CM | POA: Insufficient documentation
